# Patient Record
Sex: Male | Born: 1940
Health system: Southern US, Community
[De-identification: ages and names within clinical notes are randomized; demographics above are authoritative.]

## PROBLEM LIST (undated history)

## (undated) DIAGNOSIS — K311 Adult hypertrophic pyloric stenosis: Secondary | ICD-10-CM

## (undated) DIAGNOSIS — H04123 Dry eye syndrome of bilateral lacrimal glands: Secondary | ICD-10-CM

## (undated) DIAGNOSIS — J189 Pneumonia, unspecified organism: Secondary | ICD-10-CM

## (undated) DIAGNOSIS — R490 Dysphonia: Secondary | ICD-10-CM

## (undated) DIAGNOSIS — K632 Fistula of intestine: Secondary | ICD-10-CM

## (undated) DIAGNOSIS — K6389 Other specified diseases of intestine: Secondary | ICD-10-CM

## (undated) DIAGNOSIS — R3911 Hesitancy of micturition: Secondary | ICD-10-CM

## (undated) DIAGNOSIS — H524 Presbyopia: Secondary | ICD-10-CM

## (undated) DIAGNOSIS — Z973 Presence of spectacles and contact lenses: Secondary | ICD-10-CM

## (undated) DIAGNOSIS — H2510 Age-related nuclear cataract, unspecified eye: Secondary | ICD-10-CM

## (undated) DIAGNOSIS — K56609 Unspecified intestinal obstruction, unspecified as to partial versus complete obstruction: Secondary | ICD-10-CM

## (undated) DIAGNOSIS — E785 Hyperlipidemia, unspecified: Secondary | ICD-10-CM

## (undated) DIAGNOSIS — H11021 Central pterygium of right eye: Secondary | ICD-10-CM

## (undated) DIAGNOSIS — F0390 Unspecified dementia without behavioral disturbance: Secondary | ICD-10-CM

## (undated) DIAGNOSIS — J9611 Chronic respiratory failure with hypoxia: Secondary | ICD-10-CM

## (undated) DIAGNOSIS — IMO0002 Reserved for concepts with insufficient information to code with codable children: Secondary | ICD-10-CM

## (undated) DIAGNOSIS — H52203 Unspecified astigmatism, bilateral: Secondary | ICD-10-CM

## (undated) DIAGNOSIS — I10 Essential (primary) hypertension: Secondary | ICD-10-CM

## (undated) DIAGNOSIS — K219 Gastro-esophageal reflux disease without esophagitis: Secondary | ICD-10-CM

## (undated) DIAGNOSIS — R471 Dysarthria and anarthria: Secondary | ICD-10-CM

## (undated) DIAGNOSIS — K653 Choleperitonitis: Secondary | ICD-10-CM

## (undated) DIAGNOSIS — N529 Male erectile dysfunction, unspecified: Secondary | ICD-10-CM

## (undated) DIAGNOSIS — I998 Other disorder of circulatory system: Secondary | ICD-10-CM

## (undated) DIAGNOSIS — K279 Peptic ulcer, site unspecified, unspecified as acute or chronic, without hemorrhage or perforation: Secondary | ICD-10-CM

## (undated) DIAGNOSIS — H5203 Hypermetropia, bilateral: Secondary | ICD-10-CM

## (undated) DIAGNOSIS — J690 Pneumonitis due to inhalation of food and vomit: Secondary | ICD-10-CM

## (undated) DIAGNOSIS — H18452 Nodular corneal degeneration, left eye: Secondary | ICD-10-CM

## (undated) DIAGNOSIS — I2510 Atherosclerotic heart disease of native coronary artery without angina pectoris: Secondary | ICD-10-CM

## (undated) DIAGNOSIS — N400 Enlarged prostate without lower urinary tract symptoms: Secondary | ICD-10-CM

## (undated) DIAGNOSIS — H43819 Vitreous degeneration, unspecified eye: Secondary | ICD-10-CM

## (undated) HISTORY — PX: CARDIAC CATHETERIZATION: SHX172

## (undated) HISTORY — DX: Hypermetropia, bilateral: H52.4

## (undated) HISTORY — PX: OTHER SURGICAL HISTORY: SHX169

## (undated) HISTORY — DX: Dysarthria and anarthria: R47.1

## (undated) HISTORY — PX: TONSILLECTOMY: SUR1361

## (undated) HISTORY — DX: Reserved for concepts with insufficient information to code with codable children: IMO0002

## (undated) HISTORY — DX: Unspecified astigmatism, bilateral: H52.03

## (undated) HISTORY — DX: Hypermetropia, bilateral: H52.203

## (undated) HISTORY — DX: Atherosclerotic heart disease of native coronary artery without angina pectoris: I25.10

## (undated) HISTORY — DX: Gastro-esophageal reflux disease without esophagitis: K21.9

## (undated) HISTORY — DX: Age-related nuclear cataract, unspecified eye: H25.10

## (undated) HISTORY — DX: Benign prostatic hyperplasia without lower urinary tract symptoms: N40.0

## (undated) HISTORY — PX: CHOLECYSTECTOMY: SHX55

## (undated) HISTORY — DX: Hyperlipidemia, unspecified: E78.5

## (undated) HISTORY — DX: Nodular corneal degeneration, left eye: H18.452

## (undated) HISTORY — DX: Male erectile dysfunction, unspecified: N52.9

## (undated) HISTORY — DX: Essential (primary) hypertension: I10

## (undated) HISTORY — DX: Central pterygium of right eye: H11.021

## (undated) HISTORY — PX: EYE SURGERY: SHX253

## (undated) HISTORY — DX: Unspecified dementia, unspecified severity, without behavioral disturbance, psychotic disturbance, mood disturbance, and anxiety: F03.90

---

## 1995-02-15 DIAGNOSIS — I2510 Atherosclerotic heart disease of native coronary artery without angina pectoris: Secondary | ICD-10-CM

## 1995-02-15 HISTORY — DX: Atherosclerotic heart disease of native coronary artery without angina pectoris: I25.10

## 1999-06-12 ENCOUNTER — Ambulatory Visit (HOSPITAL_COMMUNITY): Admission: RE | Admit: 1999-06-12 | Discharge: 1999-06-12 | Payer: Self-pay | Admitting: *Deleted

## 1999-06-12 ENCOUNTER — Encounter: Payer: Self-pay | Admitting: *Deleted

## 1999-06-23 ENCOUNTER — Encounter: Payer: Self-pay | Admitting: Neurosurgery

## 1999-06-23 ENCOUNTER — Ambulatory Visit (HOSPITAL_COMMUNITY): Admission: RE | Admit: 1999-06-23 | Discharge: 1999-06-23 | Payer: Self-pay | Admitting: Neurosurgery

## 1999-07-07 ENCOUNTER — Encounter: Payer: Self-pay | Admitting: Neurosurgery

## 1999-07-07 ENCOUNTER — Ambulatory Visit (HOSPITAL_COMMUNITY): Admission: RE | Admit: 1999-07-07 | Discharge: 1999-07-07 | Payer: Self-pay | Admitting: Neurosurgery

## 1999-07-21 ENCOUNTER — Encounter: Payer: Self-pay | Admitting: Neurosurgery

## 1999-07-21 ENCOUNTER — Ambulatory Visit (HOSPITAL_COMMUNITY): Admission: RE | Admit: 1999-07-21 | Discharge: 1999-07-21 | Payer: Self-pay | Admitting: Neurosurgery

## 2006-04-07 ENCOUNTER — Encounter: Admission: RE | Admit: 2006-04-07 | Discharge: 2006-04-07 | Payer: Self-pay | Admitting: Internal Medicine

## 2006-04-26 ENCOUNTER — Encounter: Admission: RE | Admit: 2006-04-26 | Discharge: 2006-04-26 | Payer: Self-pay | Admitting: Internal Medicine

## 2006-06-15 ENCOUNTER — Encounter (INDEPENDENT_AMBULATORY_CARE_PROVIDER_SITE_OTHER): Payer: Self-pay | Admitting: Specialist

## 2006-06-15 ENCOUNTER — Ambulatory Visit (HOSPITAL_COMMUNITY): Admission: RE | Admit: 2006-06-15 | Discharge: 2006-06-15 | Payer: Self-pay | Admitting: General Surgery

## 2007-11-28 ENCOUNTER — Encounter: Admission: RE | Admit: 2007-11-28 | Discharge: 2007-11-28 | Payer: Self-pay | Admitting: Interventional Cardiology

## 2007-11-30 ENCOUNTER — Inpatient Hospital Stay (HOSPITAL_BASED_OUTPATIENT_CLINIC_OR_DEPARTMENT_OTHER): Admission: RE | Admit: 2007-11-30 | Discharge: 2007-11-30 | Payer: Self-pay | Admitting: Interventional Cardiology

## 2007-12-04 ENCOUNTER — Inpatient Hospital Stay (HOSPITAL_COMMUNITY): Admission: RE | Admit: 2007-12-04 | Discharge: 2007-12-05 | Payer: Self-pay | Admitting: Interventional Cardiology

## 2010-06-29 NOTE — Cardiovascular Report (Signed)
[name], [name]               ACCOUNT NO.:  [id number]   MEDICAL RECORD NO.:  [id number]          PATIENT TYPE:  OIB   LOCATION:  1962                         FACILITY:  [facility]   PHYSICIAN:  Lyn Records, M.D.   DATE OF BIRTH:  [birth year]   DATE OF PROCEDURE:  DATE OF DISCHARGE:  11/30/2007                            CARDIAC CATHETERIZATION   INDICATION FOR THE PROCEDURE:  Exertional dyspnea.  Prior history of  coronary artery disease with RCA stenting in 1998.  This study is being  done because of an abnormal electrocardiographic response to exercise  but a normal myocardial perfusion study.  There was a suggestion of  anterolateral ischemia.   PROCEDURES PERFORMED:  1. Left heart catheterization.  2. Selective coronary angiography.  3. Left ventriculography.   DESCRIPTION:  After informed consent and 2 mg of Versed and 50 mcg of  fentanyl, a 4-French sheath was placed in the right femoral artery using  modified Seldinger technique.  A 4-French A2 multipurpose catheter was  used for hemodynamic recordings, and left ventriculography by hand  injection.  We also performed right coronary angiography with this  catheter.  A #4 left Judkins catheter was used for left coronary  angiography.  The patient tolerated the procedure without complications.   RESULTS:  1. Hemodynamic data:      a.     Aortic pressure 105/52.      b.     Left ventricular pressure 109/90 mmHg.  2. Left ventriculography:  Left ventricular cavity size and function      are normal.  EF is 60%.  3. Coronary angiography:      a.     Left main coronary:  Normal.      b.     Left anterior descending coronary:  The proximal to mid LAD       segment is diffusely diseased with 80-90% proximal narrowing       followed by 50% narrowing in the midvessel including the region       that supplies a large first diagonal.  The first diagonal contains       50% ostial narrowing.  The LAD is transapical.      c.      Circumflex artery:  Circumflex coronary artery gives origin       to a large first and second obtuse marginal branches.  The first       obtuse marginal contains an ostial 50% narrowing.      d.     Ramus intermedius:  A large ramus branch arises from the       distal left main that is free of obstruction with the exception of       an ostial 60% narrowing.      e.     Right coronary:  The right coronary is a previously stented       vessel.  There is segmental 50% midvessel narrowing.  The distal       vessel is large.  The PDA contains 50% midvessel narrowing.   CONCLUSIONS:  1. Severe left anterior  descending narrowing involving the proximal      and mid vessel.  There is mild to moderate in-stent restenosis in      the mid right coronary with 50% obstruction of the distal portion      of the bare-metal stent.  The circumflex is widely patent with the      exception of the first obtuse marginal which contains an ostial 50%      narrowing.  2. Normal LV function.   PLAN:  Start Plavix.  PCI within the next 10-14 days.  We will discuss  in full detail with the patient.  The increased risk of the procedure  will be related to the possibility of losing a large diagonal __________  .      Lyn Records, M.D.  Electronically Signed     HWS/MEDQ  D:  11/30/2007  T:  11/30/2007  Job:  [id number]   cc:   Thora Lance, M.D.

## 2010-06-29 NOTE — Cardiovascular Report (Signed)
[name], [name]               ACCOUNT NO.:  [id number]   MEDICAL RECORD NO.:  [id number]          PATIENT TYPE:  OIB   LOCATION:  6526                         FACILITY:  [facility]   PHYSICIAN:  Lyn Records, M.D.   DATE OF BIRTH:  [birth year]   DATE OF PROCEDURE:  12/04/2007  DATE OF DISCHARGE:                            CARDIAC CATHETERIZATION   INDICATIONS:  Recent dyspnea on exertion.  High-grade LAD by diagnostic  cath.  This study is being done to stent, the LAD, and to improve the  ischemic symptom, which is dyspnea on exertion.   PROCEDURE PERFORMED:  Overlapping drug-eluting stents, mid-left anterior  descending.   DESCRIPTION:  After informed consent, a 6-French sheath was placed in  the right femoral artery using a modified Seldinger technique.  Several  aliquots of Versed 1 mg and fentanyl 25 mcg were administered for a  total of 3 mg of Versed.  The patient received a total of 100 mcg of  fentanyl.   The patient underwent the procedure after placing a 6-French sheath  using the modified Seldinger technique following 1% Xylocaine local  infiltration.  A 6-French 3.5 CLS guide catheter was used for support  and coronary visualization.  A BMW wire was placed down the diagonal in  the mid-LAD and a Prowater wire down the LAD proper.  A 2.5 x 12-mm long  apex balloon was used to predilate the severe stenosis in the mid LAD  proximal to the large diagonal.  We then placed a 15-mm long x 3.0-mm  diameter Palmaz stent in the proximal lesion slightly overlapping the  diagonal distally.  This balloon/stent following inflatus was clearly  larger than the distal segment.  A second shorter 2.75-mm Palmaz stent  was overlapped distally.  Post dilatation was then performed with a 3.0  x 8-mm long [place] Voyager at 13 atmospheres.  A 12-mm x 3.25-mm [place] Voyager  was used to dilate the proximal to thirds of the stented region.  A 13  atmospheres peak pressure was performed.  The large  diagonal branch  remained patent after stenting.  The guidewire in the diagonal was  removed before high-pressure balloon dilatation.   Angiomax 0.75 mg/kg bolus followed by a 1.75 mg/kg infusion per hour was  administered.  ACT was documented to be greater than 300.  Angiomax was  discontinued at the completion of the procedure.   RESULTS:  The stented region exhibited no evidence of stenosis post  stenting.  The large diagonal branch, which arises in the distal third  of the stented region remained widely patent with no evidence of reduced  flow.   CONCLUSION:  Successful stenting of the mid-left anterior descending  with overlapping Palmaz drug-eluting stents with a smaller diameter of  3.0 mm distally and 2.25 mm proximally.  TIMI grade 3 flow was noted  with no evidence of reduced flow of the large diagonal.   PLAN:  Aspirin and Plavix indefinitely, but not shorter than 1 year.  Discharge in a.m.      Lyn Records, M.D.  Electronically Signed  HWS/MEDQ  D:  12/04/2007  T:  12/04/2007  Job:  [id number]   cc:   Thora Lance, M.D.

## 2010-06-29 NOTE — Discharge Summary (Signed)
NAMEGILFORD, LARDIZABAL               ACCOUNT NO.:  [id number]   MEDICAL RECORD NO.:  [id number]          PATIENT TYPE:  OIB   LOCATION:  6526                         FACILITY:  [facility]   PHYSICIAN:  Lyn Records, M.D.   DATE OF BIRTH:  Nov 16, 1940   DATE OF ADMISSION:  12/04/2007  DATE OF DISCHARGE:                               DISCHARGE SUMMARY   DISCHARGE DIAGNOSES:  1. Coronary artery disease, status post drug-eluting stent to the left      anterior descending.  2. Essential hypertension, benign.  3. Pure hypercholesterolemia.  4. Long-term medication use.  5. Gastroesophageal reflux disease.  6. Erectile dysfunction.  7. Degenerative disk disease.   HOSPITAL COURSE:  Mr. [name] is a 70 year old male patient who has  known coronary artery disease.  He received a bare-metal stent to the  right coronary artery in 1997.  Over the past several months, he has had  some dyspnea on exertion, and it ultimately led to an exercise stress  test.  The stress test showed an abnormal perfusion pattern with an  inferior basal defect and a small region of anterolateral and mid  anterolateral ischemia.  His EF was normal.   He was brought into the hospital on November 30, 2007, for cardiac  catheterization.  This showed an 80% proximal LAD stenosis, first  diagonal 50% ostial, OM-1 with 50% ostial lesion, the ramus with 60%  narrowing, right coronary artery with a 50% mid vessel narrowing, the  PDA 50% mid vessel narrowing.   The patient was brought back to the hospital on December 04, 2007, and  underwent drug-eluting stent placement to the LAD lesion.  He tolerated  the procedure well and remained in the hospital overnight.   LABORATORY STUDIES UPON DISCHARGE:  Sodium of 138, potassium 4.3, BUN  14, creatinine 0.95, hemoglobin 14.3, and hematocrit 41.7.   DISCHARGE MEDICATIONS:  1. Plavix 75 mg a day.  2. Enteric-coated aspirin 325 mg daily.  3. Lipitor 40 mg a day.  4.  Diovan/hydrochlorothiazide 160/12.5 mg a day.  5. Ranitidine daily as before.  6. Multivitamins as before.  7. Sublingual nitroglycerin p.r.n. chest pain.   The patient is to remain on a low-sodium heart-healthy diet.  Clean cath  site gently with soap and water, no scrubbing.  Increase activities  slowly.  No lifting over 10 pounds for 1 week.  No driving for 2 days.  Follow up with Dr. Katrinka Blazing on December 19, 2007, at 11:30 a.m.      Guy Franco, P.A.      Lyn Records, M.D.  Electronically Signed    LB/MEDQ  D:  12/05/2007  T:  12/05/2007  Job:  [id number]   cc:   Thora Lance, M.D.

## 2010-07-02 NOTE — Op Note (Signed)
[name], [name]               ACCOUNT NO.:  [id number]   MEDICAL RECORD NO.:  [id number]          PATIENT TYPE:  AMB   LOCATION:  SDS                          FACILITY:  [facility]   PHYSICIAN:  Cherylynn Ridges, M.D.    DATE OF BIRTH:  21-Jun-1940   DATE OF PROCEDURE:  06/15/2006  DATE OF DISCHARGE:                               OPERATIVE REPORT   PREOPERATIVE DIAGNOSIS:  Symptomatic cholelithiasis.   POSTOPERATIVE DIAGNOSIS:  Symptomatic cholelithiasis.   PROCEDURE:  Laparoscopic cholecystectomy with cholangiogram.   SURGEON:  Cherylynn Ridges, M.D.   ASSISTANT:  Magnus Ivan, RNFA   ANESTHESIA:  General endotracheal.   SPECIMEN:  Gallbladder plus stones.   ESTIMATED BLOOD LOSS:  Less than 30 mL.   COMPLICATIONS:  None.   CONDITION:  Stable.   INDICATIONS FOR OPERATION:  The patient is a 69 year old who had a  gallbladder attack and was found to have stones.  He comes in now for an  elective laparoscopic cholecystectomy.   FINDINGS:  The patient had a normal cholangiogram with good flow into  the duodenum, no ductal dilatation, and no filling defects.  He had a  large stone in his gallbladder and evidence of acute inflammation on  laparoscopy.   OPERATION:  The patient was taken to the operating room and placed on  the table in the supine position.  After an adequate general anesthetic  was administered, he was prepped and draped in the usual sterile manner  exposing the midline and right upper quadrant.   A supraumbilical curvilinear incision was made using a #11 blade and  taken down to the fascia just above the umbilicus.  The patient had a  slight periumbilical defect which was used to enter into the peritoneal  cavity.  The fascia was grasped with Kocher clamps, and then we bluntly  dissected down into the peritoneal cavity with a Kelly clamp.  We  stretched out this cavity using a Kelly clamp as we punctured into the  preperitoneal space through that into the  peritoneal cavity.  A  pursestring suture of 0 Vicryl was passed around the fascial opening and  then a Hassan cannula passed into the peritoneal cavity and secured in  place with the pursestring suture.   We insufflated carbon dioxide gas up to a maximal pressure of 15 mmHg,  and then we passed two right costal margin 5-mm cannulas and a  subxiphoid 12/11-mm cannula under direct vision with the patient in  reverse Trendelenburg and the left side tilted down.  Once this was  done, the dissection was begun.   There were omental adhesions to the body and infundibulum the  gallbladder which were bluntly dissected away so that we could grasp it  and retract it towards the anterior abdominal wall and right upper  quadrant.  We dissected out the peritoneum overlying the triangle of  Calot and hepatoduodenal triangle, isolating the cystic duct and the  cystic artery.  The cystic artery was clipped proximally x2 and distally  x1 and transected.  We passed the clip along the gallbladder side and  cystic duct and then made a cholecystodochotomy through which a Cook  catheter which had been passed through the anterior abdominal wall was  passed.  This allowed [place] to perform a cholangiogram which showed  findings mentioned previously, no ductal filling defects, no dilatation,  good flow into the duodenum, and good proximal filling.   Once cholangiogram was completed, we removed the clip which had secured  it in place, distally clipped the cystic duct x3 and then transected it.  We transected a posterior branch of the cystic artery also and then  dissected out the gallbladder from the hepatic bed with minimal  difficulty.  We retrieved it from the supraumbilical site without having  to use an EndoCatch bag.  The pursestring suture which was in place to  secure the [facility] cannula was tied down in order to close off the  fascia, and we placed an additional stitch of to the right lateral  aspect of  the fascial closure.  This was with 0 Vicryl.  We aspirated  fluid and blood from around the gallbladder and above the liver.  We  aspirated fluid and gas and removed all that prior to removing all  cannulas.   The skin was closed using running subcuticular stitch of 4-0 Vicryl at  all sites, except for the lateral-most sites which were closed with  Dermabond.  Sterile dressings were applied to all wounds.  0.25%  Marcaine with epinephrine was injected at all sites.  Needle counts and  sponge counts and instrument counts were correct at the conclusion of  the case.      Cherylynn Ridges, M.D.  Electronically Signed     JOW/MEDQ  D:  06/15/2006  T:  06/15/2006  Job:  [id number]   cc:   Lyn Records, M.D.  Thora Lance, M.D.

## 2010-11-15 LAB — POCT I-STAT, CHEM 8
Calcium, Ion: 1.18
Glucose, Bld: 94
HCT: 39
Hemoglobin: 13.3
TCO2: 26

## 2010-11-15 LAB — CBC
Platelets: 283
RDW: 12.1

## 2010-11-15 LAB — BASIC METABOLIC PANEL
Chloride: 106
Creatinine, Ser: 0.95
Potassium: 4

## 2012-04-17 ENCOUNTER — Other Ambulatory Visit: Payer: Self-pay | Admitting: Internal Medicine

## 2012-04-17 DIAGNOSIS — I77819 Aortic ectasia, unspecified site: Secondary | ICD-10-CM

## 2012-10-25 ENCOUNTER — Ambulatory Visit
Admission: RE | Admit: 2012-10-25 | Discharge: 2012-10-25 | Disposition: A | Discharge status: Home / Self Care | Payer: Medicare Other | Source: Ambulatory Visit | Attending: Internal Medicine | Admitting: Internal Medicine

## 2012-10-25 DIAGNOSIS — I77819 Aortic ectasia, unspecified site: Secondary | ICD-10-CM

## 2012-10-30 DIAGNOSIS — H18459 Nodular corneal degeneration, unspecified eye: Secondary | ICD-10-CM | POA: Insufficient documentation

## 2012-10-30 DIAGNOSIS — H2510 Age-related nuclear cataract, unspecified eye: Secondary | ICD-10-CM | POA: Insufficient documentation

## 2012-10-30 DIAGNOSIS — H5200 Hypermetropia, unspecified eye: Secondary | ICD-10-CM | POA: Insufficient documentation

## 2012-10-30 DIAGNOSIS — H43819 Vitreous degeneration, unspecified eye: Secondary | ICD-10-CM

## 2012-10-30 DIAGNOSIS — H11029 Central pterygium of unspecified eye: Secondary | ICD-10-CM | POA: Insufficient documentation

## 2012-10-30 HISTORY — DX: Vitreous degeneration, unspecified eye: H43.819

## 2013-04-24 DIAGNOSIS — I10 Essential (primary) hypertension: Secondary | ICD-10-CM | POA: Insufficient documentation

## 2013-04-24 DIAGNOSIS — E785 Hyperlipidemia, unspecified: Secondary | ICD-10-CM | POA: Insufficient documentation

## 2013-04-24 DIAGNOSIS — I2510 Atherosclerotic heart disease of native coronary artery without angina pectoris: Secondary | ICD-10-CM | POA: Insufficient documentation

## 2013-04-25 ENCOUNTER — Encounter (INDEPENDENT_AMBULATORY_CARE_PROVIDER_SITE_OTHER): Payer: Self-pay

## 2013-04-25 ENCOUNTER — Encounter: Payer: Self-pay | Admitting: Interventional Cardiology

## 2013-04-25 ENCOUNTER — Ambulatory Visit (INDEPENDENT_AMBULATORY_CARE_PROVIDER_SITE_OTHER): Payer: Medicare Other | Admitting: Interventional Cardiology

## 2013-04-25 VITALS — BP 104/58 | HR 60 | Ht 72.0 in | Wt 210.0 lb

## 2013-04-25 DIAGNOSIS — R0609 Other forms of dyspnea: Secondary | ICD-10-CM

## 2013-04-25 DIAGNOSIS — R0989 Other specified symptoms and signs involving the circulatory and respiratory systems: Secondary | ICD-10-CM

## 2013-04-25 NOTE — Patient Instructions (Signed)
Your physician recommends that you continue on your current medications as directed. Please refer to the Current Medication list given to you today.  Your physician has requested that you have en exercise stress myoview. For further information please visit [email or web address]. Please follow instruction sheet, as given.   

## 2013-04-25 NOTE — Progress Notes (Signed)
Patient ID: [name], male   DOB: [birth year], 73 y.o.   MRN: [id number] Past Medical History  #1. Coronary atherosclerotic heart disease. Bare metal stent right coronary artery 1997., Brittne Kawasaki, DES LAD 10/09   #2. Hypertension   #3. Gastroesophageal reflux disease   #4. Erectile dysfunction   #5. Degenerative disc disease   BPH      [address] [place], [place]  [address] Phone: [phone] Fax:  [phone]  Date:  04/25/2013   ID:  [name], DOB 08-Sep-1940, MRN [id number]  PCP:  No primary provider on file.   ASSESSMENT:  1. Two-month history of exertional dyspnea and nocturnal chest tightness 2. Coronary artery disease with prior history of RCA bare-metal stent 1997 and LAD DES 2000 and 3. Hypertension 4. Hyperlipidemia 5. He erectile dysfunction  PLAN:  1. Stress Cardiolite to rule out myocardial ischemia as the source of dyspnea and chest tightness   SUBJECTIVE: [name] is a 73 y.o. male who has a long-standing history of coronary artery disease. First coronary event occurred in 1997. A younger brother recently died of an acute myocardial infarction. He comes in now with a two-month history of exertional intolerance, dyspnea, and mild tightness with activities that he customarily performs. He is a member of the [employer] program at the [facility] and has noted these complaints with activities that he could previously performed fairly easily. Rest relieves the discomfort and dyspnea promptly. He also notices exertional leg fatigue.   Wt Readings from Last 3 Encounters:  No data found for Wt     Past Medical History  Diagnosis Date  . Hyperlipidemia   . Hypertension   . Coronary artery disease 1997    bare mental stent right coronart artery  . Gastroesophageal reflux disease   . ED (erectile dysfunction)   . Degenerative disc disease     Current Outpatient Prescriptions  Medication Sig Dispense Refill  . aspirin  325 MG tablet Take 325 mg by mouth daily.      [name] atorvastatin (LIPITOR) 40 MG tablet Take 40 mg by mouth daily.      . clopidogrel (PLAVIX) 75 MG tablet Take 75 mg by mouth daily with breakfast.      . Multiple Vitamin (MULTIVITAMIN) tablet Take 1 tablet by mouth daily.      . nitroGLYCERIN (NITROSTAT) 0.4 MG SL tablet Place 0.4 mg under the tongue every 5 (five) minutes as needed for chest pain.      . ranitidine (ZANTAC) 150 MG tablet Take 150 mg by mouth 2 (two) times daily.      . valsartan-hydrochlorothiazide (DIOVAN-HCT) 160-12.5 MG per tablet Take 1 tablet by mouth daily.       No current facility-administered medications for this visit.    Allergies:    Allergies  Allergen Reactions  . Lisinopril Cough    Social History:  The patient     ROS:  Please see the history of present illness.   Denies palpitations, orthopnea, PND, edema, and neurological complaints.   All other systems reviewed and negative.   OBJECTIVE: VS:  There were no vitals taken for this visit. Well nourished, well developed, in no acute distress, younger than stated age HEENT: normal Neck: JVD flat. Carotid bruit absent  Cardiac:  normal S1, S2; RRR; no murmur Lungs:  clear to auscultation bilaterally, no wheezing, rhonchi or rales Abd: soft, nontender, no hepatomegaly Ext: Edema absent. Pulses 2+ bilateral Skin: warm and  dry Neuro:  CNs 2-12 intact, no focal abnormalities noted  EKG:  Sinus bradycardia       Signed, Darci NeedleHenry W. B. Maysen Sudol III, MD 04/25/2013 8:16 AM

## 2013-05-09 ENCOUNTER — Ambulatory Visit (HOSPITAL_COMMUNITY): Payer: Medicare Other | Attending: Internal Medicine | Admitting: Radiology

## 2013-05-09 ENCOUNTER — Encounter: Payer: Self-pay | Admitting: Internal Medicine

## 2013-05-09 VITALS — BP 137/62 | HR 57 | Ht 72.0 in | Wt 208.0 lb

## 2013-05-09 DIAGNOSIS — R079 Chest pain, unspecified: Secondary | ICD-10-CM | POA: Insufficient documentation

## 2013-05-09 DIAGNOSIS — R0989 Other specified symptoms and signs involving the circulatory and respiratory systems: Secondary | ICD-10-CM | POA: Insufficient documentation

## 2013-05-09 DIAGNOSIS — I2510 Atherosclerotic heart disease of native coronary artery without angina pectoris: Secondary | ICD-10-CM

## 2013-05-09 DIAGNOSIS — R0609 Other forms of dyspnea: Secondary | ICD-10-CM | POA: Insufficient documentation

## 2013-05-09 MED ORDER — TECHNETIUM TC 99M SESTAMIBI GENERIC - CARDIOLITE
30.0000 | Freq: Once | INTRAVENOUS | Status: AC | PRN
  Administered 2013-05-09: 30 via INTRAVENOUS

## 2013-05-09 MED ORDER — TECHNETIUM TC 99M SESTAMIBI GENERIC - CARDIOLITE
10.0000 | Freq: Once | INTRAVENOUS | Status: AC | PRN
  Administered 2013-05-09: 10 via INTRAVENOUS

## 2013-05-09 NOTE — Progress Notes (Signed)
[facility] SITE 3 NUCLEAR MED [address] [place], [place] [address] [phone]    Cardiology Nuclear Med Study  [name] is a 73 y.o. male     MRN : [id number]     DOB: [birth year]  Procedure Date: 05/09/2013  Nuclear Med Background Indication for Stress Test:  Evaluation for Ischemia and Stent Patency History:  CAD, Stent to RCA 1997 and LAD 2009, MPI 2010 (normal)(scar) EF 67% Cardiac Risk Factors: History of Smoking, Hypertension and Lipids  Symptoms:  Chest Pain (last date of chest discomfort was a week and a half ago) and DOE   Nuclear Pre-Procedure Caffeine/Decaff Intake:  None NPO After: 8:00pm   Lungs:  clear O2 Sat: 96% on room air. IV 0.9% NS with Angio Cath:  22g  IV Site: R Hand  IV Started by:  Bonnita LevanJackie Smith, RN  Chest Size (in):  46 Cup Size: n/a  Height: 6' (1.829 m)  Weight:  208 lb (94.348 kg)  BMI:  Body mass index is 28.2 kg/(m^2). Tech Comments:  N/A    Nuclear Med Study 1 or 2 day study: 1 day  Stress Test Type:  Stress  Reading MD: N/A  Order Authorizing Provider:  Verdis PrimeHenry Smith, MD  Resting Radionuclide: Technetium 4068m Sestamibi  Resting Radionuclide Dose: 11.0 mCi   Stress Radionuclide:  Technetium 7468m Sestamibi  Stress Radionuclide Dose: 33.0 mCi           Stress Protocol Rest HR: 57 Stress HR: 137  Rest BP: 137/62 Stress BP: 208/56  Exercise Time (min): 4:30 METS: 6.4           Dose of Adenosine (mg):  n/a Dose of Lexiscan: n/a mg  Dose of Atropine (mg): n/a Dose of Dobutamine: n/a mcg/kg/min (at max HR)  Stress Test Technologist: Nelson ChimesSharon Brooks, BS-ES  Nuclear Technologist:  Domenic PoliteStephen Carbone, CNMT     Rest Procedure:  Myocardial perfusion imaging was performed at rest 45 minutes following the intravenous administration of Technetium 3068m Sestamibi. Rest ECG: NSR - Normal EKG  Stress Procedure:  The patient exercised on the treadmill utilizing the Bruce Protocol for 4:30 minutes. The patient stopped due to being very  SOB and denied any chest pain.  Technetium 8368m Sestamibi was injected at peak exercise and myocardial perfusion imaging was performed after a brief delay. Stress ECG: No significant change from baseline ECG  QPS Raw Data Images:  Normal; no motion artifact; normal heart/lung ratio. Stress Images:  Normal homogeneous uptake in all areas of the myocardium. Rest Images:  Normal homogeneous uptake in all areas of the myocardium. Subtraction (SDS):  No evidence of ischemia. Transient Ischemic Dilatation (Normal <1.22):  0.98 Lung/Heart Ratio (Normal <0.45):  0.29  Quantitative Gated Spect Images QGS EDV:  118 ml QGS ESV:  51 ml  Impression Exercise Capacity:  Poor exercise capacity. BP Response:  Hypertensive blood pressure response. Clinical Symptoms:  The exercise was limited by severe dyspnea.. ECG Impression:  No significant ST segment change suggestive of ischemia. Comparison with Prior Nuclear Study: No images to compare  Overall Impression:  Low risk stress nuclear study.  No ischemia or scar. Poor exercise tolerance limited by dyspnea.  LV Ejection Fraction: 57%.  LV Wall Motion:  NL LV Function; NL Wall Motion   Limited [name]

## 2013-05-14 ENCOUNTER — Telehealth: Payer: Self-pay

## 2013-05-14 NOTE — Telephone Encounter (Signed)
called to give pt myoview results.unable to lmom pt phone rings out. 

## 2013-05-14 NOTE — Telephone Encounter (Signed)
Message copied by Jarvis NewcomerPARRIS-GODLEY, LISA S on Tue May 14, 2013 10:27 AM ------      Message from: Verdis PrimeSMITH, HENRY      Created: Fri May 10, 2013 12:22 PM       Low risk study without evidence of blocked arteries ------

## 2013-05-15 NOTE — Telephone Encounter (Signed)
pt aware of myoview results.Low risk study without evidence of blocked arteries.pt verbalized understanding.

## 2013-05-15 NOTE — Telephone Encounter (Signed)
Message copied by Jarvis NewcomerPARRIS-GODLEY, LISA S on Wed May 15, 2013  1:32 PM ------      Message from: Verdis PrimeSMITH, HENRY      Created: Fri May 10, 2013 12:22 PM       Low risk study without evidence of blocked arteries ------

## 2014-12-11 ENCOUNTER — Other Ambulatory Visit: Payer: Self-pay | Admitting: Internal Medicine

## 2014-12-11 DIAGNOSIS — R1084 Generalized abdominal pain: Secondary | ICD-10-CM

## 2014-12-17 ENCOUNTER — Ambulatory Visit
Admission: RE | Admit: 2014-12-17 | Discharge: 2014-12-17 | Disposition: A | Discharge status: Home / Self Care | Payer: Medicare Other | Source: Ambulatory Visit | Attending: Internal Medicine | Admitting: Internal Medicine

## 2014-12-17 DIAGNOSIS — R1084 Generalized abdominal pain: Secondary | ICD-10-CM

## 2014-12-17 MED ORDER — IOPAMIDOL (ISOVUE-300) INJECTION 61%
125.0000 mL | Freq: Once | INTRAVENOUS | Status: AC | PRN
  Administered 2014-12-17: 125 mL via INTRAVENOUS

## 2015-03-04 ENCOUNTER — Other Ambulatory Visit: Payer: Self-pay | Admitting: Gastroenterology

## 2015-06-04 ENCOUNTER — Other Ambulatory Visit: Payer: Self-pay | Admitting: Gastroenterology

## 2015-06-11 ENCOUNTER — Other Ambulatory Visit: Payer: Self-pay | Admitting: Internal Medicine

## 2015-06-11 ENCOUNTER — Ambulatory Visit
Admission: RE | Admit: 2015-06-11 | Discharge: 2015-06-11 | Disposition: A | Discharge status: Home / Self Care | Payer: Medicare Other | Source: Ambulatory Visit | Attending: Internal Medicine | Admitting: Internal Medicine

## 2015-06-11 DIAGNOSIS — R471 Dysarthria and anarthria: Secondary | ICD-10-CM

## 2015-06-20 ENCOUNTER — Ambulatory Visit
Admission: RE | Admit: 2015-06-20 | Discharge: 2015-06-20 | Disposition: A | Discharge status: Home / Self Care | Payer: Medicare Other | Source: Ambulatory Visit | Attending: Internal Medicine | Admitting: Internal Medicine

## 2015-06-20 DIAGNOSIS — R471 Dysarthria and anarthria: Secondary | ICD-10-CM

## 2015-06-24 ENCOUNTER — Encounter (HOSPITAL_COMMUNITY): Payer: Self-pay | Admitting: *Deleted

## 2015-07-01 ENCOUNTER — Ambulatory Visit (HOSPITAL_COMMUNITY): Payer: Medicare Other | Admitting: Anesthesiology

## 2015-07-01 ENCOUNTER — Ambulatory Visit (HOSPITAL_COMMUNITY)
Admission: RE | Admit: 2015-07-01 | Discharge: 2015-07-01 | Disposition: A | Discharge status: Home / Self Care | Payer: Medicare Other | Source: Ambulatory Visit | Attending: Gastroenterology | Admitting: Gastroenterology

## 2015-07-01 ENCOUNTER — Encounter (HOSPITAL_COMMUNITY)
Admission: RE | Disposition: A | Discharge status: Home / Self Care | Payer: Self-pay | Source: Ambulatory Visit | Attending: Gastroenterology

## 2015-07-01 ENCOUNTER — Encounter (HOSPITAL_COMMUNITY): Payer: Self-pay

## 2015-07-01 ENCOUNTER — Other Ambulatory Visit: Payer: Self-pay | Admitting: Gastroenterology

## 2015-07-01 DIAGNOSIS — M1990 Unspecified osteoarthritis, unspecified site: Secondary | ICD-10-CM | POA: Insufficient documentation

## 2015-07-01 DIAGNOSIS — Z7982 Long term (current) use of aspirin: Secondary | ICD-10-CM | POA: Diagnosis not present

## 2015-07-01 DIAGNOSIS — I2510 Atherosclerotic heart disease of native coronary artery without angina pectoris: Secondary | ICD-10-CM | POA: Diagnosis not present

## 2015-07-01 DIAGNOSIS — I10 Essential (primary) hypertension: Secondary | ICD-10-CM | POA: Insufficient documentation

## 2015-07-01 DIAGNOSIS — Z87891 Personal history of nicotine dependence: Secondary | ICD-10-CM | POA: Diagnosis not present

## 2015-07-01 DIAGNOSIS — Z09 Encounter for follow-up examination after completed treatment for conditions other than malignant neoplasm: Secondary | ICD-10-CM | POA: Diagnosis present

## 2015-07-01 DIAGNOSIS — K259 Gastric ulcer, unspecified as acute or chronic, without hemorrhage or perforation: Secondary | ICD-10-CM | POA: Insufficient documentation

## 2015-07-01 DIAGNOSIS — Z79899 Other long term (current) drug therapy: Secondary | ICD-10-CM | POA: Diagnosis not present

## 2015-07-01 DIAGNOSIS — K219 Gastro-esophageal reflux disease without esophagitis: Secondary | ICD-10-CM | POA: Insufficient documentation

## 2015-07-01 DIAGNOSIS — Z955 Presence of coronary angioplasty implant and graft: Secondary | ICD-10-CM | POA: Insufficient documentation

## 2015-07-01 HISTORY — PX: ESOPHAGOGASTRODUODENOSCOPY (EGD) WITH PROPOFOL: SHX5813

## 2015-07-01 SURGERY — ESOPHAGOGASTRODUODENOSCOPY (EGD) WITH PROPOFOL
Anesthesia: Monitor Anesthesia Care

## 2015-07-01 MED ORDER — PROPOFOL 500 MG/50ML IV EMUL
INTRAVENOUS | Status: DC | PRN
  Administered 2015-07-01: 125 ug/kg/min via INTRAVENOUS

## 2015-07-01 MED ORDER — SODIUM CHLORIDE 0.9 % IV SOLN: INTRAVENOUS | Status: DC

## 2015-07-01 MED ORDER — PROPOFOL 500 MG/50ML IV EMUL
INTRAVENOUS | Status: DC | PRN
  Administered 2015-07-01: 60 mg via INTRAVENOUS

## 2015-07-01 MED ORDER — LACTATED RINGERS IV SOLN
INTRAVENOUS | Status: DC
  Administered 2015-07-01: 1000 mL via INTRAVENOUS

## 2015-07-01 MED ORDER — PROPOFOL 10 MG/ML IV BOLUS
INTRAVENOUS | Status: AC
  Filled 2015-07-01: qty 40

## 2015-07-01 SURGICAL SUPPLY — 15 items

## 2015-07-01 NOTE — Discharge Instructions (Signed)
Endoscopy °Care After °Please read the instructions outlined below and refer to this sheet in the next few weeks. These discharge instructions provide you with general information on caring for yourself after you leave the hospital. Your doctor may also give you specific instructions. While your treatment has been planned according to the most current medical practices available, unavoidable complications occasionally occur. If you have any problems or questions after discharge, please call Dr. Jarion Hawthorne ([facility]) at [phone]. ° °HOME CARE INSTRUCTIONS °Activity °· You may resume your regular activity but move at a slower pace for the next 24 hours.  °· Take frequent rest periods for the next 24 hours.  °· Walking will help expel (get rid of) the air and reduce the bloated feeling in your abdomen.  °· No driving for 24 hours (because of the anesthesia (medicine) used during the test).  °· You may shower.  °· Do not sign any important legal documents or operate any machinery for 24 hours (because of the anesthesia used during the test).  °Nutrition °· Drink plenty of fluids.  °· You may resume your normal diet.  °· Begin with a light meal and progress to your normal diet.  °· Avoid alcoholic beverages for 24 hours or as instructed by your caregiver.  °Medications °You may resume your normal medications unless your caregiver tells you otherwise. °What you can expect today °· You may experience abdominal discomfort such as a feeling of fullness or "gas" pains.  °· You may experience a sore throat for 2 to 3 days. This is normal. Gargling with salt water may help this.  °·  °SEEK IMMEDIATE MEDICAL CARE IF: °· You have excessive nausea (feeling sick to your stomach) and/or vomiting.  °· You have severe abdominal pain and distention (swelling).  °· You have trouble swallowing.  °· You have a temperature over 100° F (37.8° C).  °· You have rectal bleeding or vomiting of blood.  °Document Released:  09/15/2003 Document Revised: 10/13/2010 Document Reviewed: 03/28/2007 °ExitCare® Patient Information ©2012 [employer]. °

## 2015-07-01 NOTE — Op Note (Signed)
[facility] Patient Name: Danny MealingRalph Goody Procedure Date: 07/01/2015 MRN: [id number] Attending MD: Willis ModenaWilliam Jayleigh Notarianni , MD Date of Birth: [birth year] CSN: [id number] Age: [age] Admit Type: Outpatient Procedure:                Upper GI endoscopy Indications:              Epigastric abdominal pain, Follow-up of peptic ulcer Providers:                Willis ModenaWilliam Ellayna Hilligoss, MD, Anthony Saraniel Madden, RN, Jacquiline DoeJennifer                            Zhu, RN, Clearnce SorrelKatie Smith, Technician, Mirian MoKelley Carver,                            CRNA Referring MD:              Medicines:                Monitored Anesthesia Care Complications:            No immediate complications. Estimated Blood Loss:     Estimated blood loss: none. Procedure:                Pre-Anesthesia Assessment:                           - Prior to the procedure, a History and Physical                            was performed, and patient medications and                            allergies were reviewed. The patient's tolerance of                            previous anesthesia was also reviewed. The risks                            and benefits of the procedure and the sedation                            options and risks were discussed with the patient.                            All questions were answered, and informed consent                            was obtained. Prior Anticoagulants: The patient has                            taken aspirin. ASA Grade Assessment: III - A                            patient with severe systemic disease. After  reviewing the risks and benefits, the patient was                            deemed in satisfactory condition to undergo the                            procedure.                           After obtaining informed consent, the endoscope was                            passed under direct vision. Throughout the                            procedure, the patient's blood pressure, pulse,  and                            oxygen saturations were monitored continuously. The                            was introduced through the mouth, and advanced to                            the pylorus. The upper GI endoscopy was                            accomplished without difficulty. The patient                            tolerated the procedure well. Scope In: Scope Out: Findings:      The examined esophagus was normal.      One non-bleeding superficial gastric ulcer with no stigmata of bleeding       was found at the pylorus. This was biopsied with a cold forceps for       histology. Lumen of pyloric channel was about 6 mm in diameter, and too       narrow to permit intubation of the duodenum; the duodenum was       accordingly not evaluated. Impression:               - Normal esophagus.                           - Non-bleeding gastric ulcer with no stigmata of                            bleeding. Biopsied.                           - The examination was otherwise normal. Moderate Sedation:      None Recommendation:           - Patient has a contact number available for                            emergencies. The signs and  symptoms of potential                            delayed complications were discussed with the                            patient. Return to normal activities tomorrow.                            Written discharge instructions were provided to the                            patient.                           - Discharge patient to home (ambulatory).                           - Resume previous diet today.                           - Continue present medications.                           - Await pathology results.                           - Return to GI clinic after studies are complete.                           - Return to referring physician as previously                            scheduled. Procedure Code(s):        --- Professional ---                            [phone], 52, Esophagogastroduodenoscopy, flexible,                            transoral; with biopsy, single or multiple Diagnosis Code(s):        --- Professional ---                           K25.9, Gastric ulcer, unspecified as acute or                            chronic, without hemorrhage or perforation                           R10.13, Epigastric pain                           K27.9, Peptic ulcer, site unspecified, unspecified                            as acute or chronic, without hemorrhage or  perforation CPT copyright 2016 American Medical Association. All rights reserved. The codes documented in this report are preliminary and upon coder review may  be revised to meet current compliance requirements. Willis Modena, MD 07/01/2015 10:04:34 AM This report has been signed electronically. Number of Addenda: 0

## 2015-07-01 NOTE — Transfer of Care (Signed)
Immediate Anesthesia Transfer of Care Note  Patient: [name]  Procedure(s) Performed: Procedure(s): ESOPHAGOGASTRODUODENOSCOPY (EGD) WITH PROPOFOL (N/A)  Patient Location: PACU  Anesthesia Type:MAC  Level of Consciousness: sedated, patient cooperative and responds to stimulation  Airway & Oxygen Therapy: Patient Spontanous Breathing and Patient connected to nasal cannula oxygen  Post-op Assessment: Report given to RN and Post -op Vital signs reviewed and stable  Post vital signs: Reviewed and stable  Last Vitals:  Filed Vitals:   07/01/15 0830 07/01/15 0934  BP: 150/39 127/44  Pulse: 51 43  Temp: 36.6 C   Resp: 15 18    Last Pain: There were no vitals filed for this visit.       Complications: No apparent anesthesia complications

## 2015-07-01 NOTE — Anesthesia Postprocedure Evaluation (Signed)
Anesthesia Post Note  Patient: [name]  Procedure(s) Performed: Procedure(s) (LRB): ESOPHAGOGASTRODUODENOSCOPY (EGD) WITH PROPOFOL (N/A)  Patient location during evaluation: Endoscopy Anesthesia Type: MAC Level of consciousness: awake and alert Pain management: pain level controlled Vital Signs Assessment: post-procedure vital signs reviewed and stable Respiratory status: spontaneous breathing, nonlabored ventilation, respiratory function stable and patient connected to nasal cannula oxygen Cardiovascular status: stable and blood pressure returned to baseline Anesthetic complications: no    Last Vitals:  Filed Vitals:   07/01/15 0950 07/01/15 1000  BP: 149/49 164/43  Pulse: 49 43  Temp:    Resp: 20 17    Last Pain: There were no vitals filed for this visit.               Cecile HearingStephen Edward Turk

## 2015-07-01 NOTE — Anesthesia Preprocedure Evaluation (Addendum)
Anesthesia Evaluation  Patient identified by MRN, date of birth, ID band Patient awake    Reviewed: Allergy & Precautions, NPO status , Patient's Chart, lab work & pertinent test results  Airway Mallampati: II  TM Distance: <3 FB Neck ROM: Full    Dental  (+) Teeth Intact, Dental Advisory Given   Pulmonary neg pulmonary ROS, former smoker,    Pulmonary exam normal breath sounds clear to auscultation       Cardiovascular Exercise Tolerance: Good hypertension, Pt. on medications (-) angina+ CAD and + Cardiac Stents (RCA BMS)  (-) Past MI negative cardio ROS Normal cardiovascular exam Rhythm:Regular Rate:Normal     Neuro/Psych negative neurological ROS     GI/Hepatic Neg liver ROS, GERD  Medicated,  Endo/Other  negative endocrine ROS  Renal/GU negative Renal ROS     Musculoskeletal  (+) Arthritis , Osteoarthritis,    Abdominal   Peds  Hematology negative hematology ROS (+)   Anesthesia Other Findings Day of surgery medications reviewed with the patient.  Reproductive/Obstetrics                            Anesthesia Physical Anesthesia Plan  ASA: III  Anesthesia Plan: MAC   Post-op Pain Management:    Induction: Intravenous  Airway Management Planned: Nasal Cannula  Additional Equipment:   Intra-op Plan:   Post-operative Plan:   Informed Consent: I have reviewed the patients History and Physical, chart, labs and discussed the procedure including the risks, benefits and alternatives for the proposed anesthesia with the patient or authorized representative who has indicated his/her understanding and acceptance.   Dental advisory given  Plan Discussed with: CRNA and Anesthesiologist  Anesthesia Plan Comments: (Discussed risks/benefits/alternatives to MAC sedation including need for ventilatory support, hypotension, need for conversion to general anesthesia.  All patient questions  answered.  Patient/guardian wishes to proceed.)        Anesthesia Quick Evaluation

## 2015-07-01 NOTE — H&P (Signed)
Patient interval history reviewed.  Patient examined again.  There has been no change from documented H/P dated 06/02/15 (scanned into chart from our office) except as documented above.  Assessment:  1.  Pyloric channel ulcer; evaluate for ulcer healing.  Plan:  1.  Endoscopy. 2.  Risks (up to and including bleeding, infection, perforation, pancreatitis that can be complicated by infected necrosis and death), benefits (removal of stones, alleviating blockage, decreasing risk of cholangitis or choledocholithiasis-related pancreatitis), and alternatives (watchful waiting, percutaneous transhepatic cholangiography) of ERCP were explained to patient/family in detail and patient elects to proceed.

## 2015-07-02 ENCOUNTER — Encounter (HOSPITAL_COMMUNITY): Payer: Self-pay | Admitting: Gastroenterology

## 2015-07-08 ENCOUNTER — Other Ambulatory Visit: Payer: Self-pay | Admitting: Gastroenterology

## 2015-07-08 DIAGNOSIS — K259 Gastric ulcer, unspecified as acute or chronic, without hemorrhage or perforation: Secondary | ICD-10-CM

## 2015-07-14 ENCOUNTER — Ambulatory Visit
Admission: RE | Admit: 2015-07-14 | Discharge: 2015-07-14 | Disposition: A | Discharge status: Home / Self Care | Payer: Medicare Other | Source: Ambulatory Visit | Attending: Gastroenterology | Admitting: Gastroenterology

## 2015-07-14 DIAGNOSIS — K259 Gastric ulcer, unspecified as acute or chronic, without hemorrhage or perforation: Secondary | ICD-10-CM

## 2015-08-04 ENCOUNTER — Other Ambulatory Visit: Payer: Self-pay | Admitting: Surgery

## 2015-08-04 NOTE — H&P (Signed)
[name] 08/04/2015 8:29 AM Location: [facility] Patient #: [id number] DOB: 1940-09-08 Married / Language: English / Race: White Male  Patient Care Team: [name], [name] as PCP - General (Internal Medicine) Danny Soda, [name] as Consulting Physician (General Surgery) Danny Modena, [name] as Consulting Physician (Gastroenterology)   History of Present Illness [name]; 08/04/2015 1:41 PM) The patient is a 75 year old male who presents with a gastric outlet obstruction. Note for "Gastric outlet obstruction": Patient sent for surgical consultation for concern of pyloric stricture most likely due to ulcer disease. Request by Dr. Chrissie [name] outflow with [facility] physicians.  Pleasant, active, elderly gentleman. Our group abdominal laparoscopic cholecystectomy on him about 10 years ago. Dr. Magnus [name]. Has had episodes of epigastric pain and discomfort intermittently over the past year. Was treated for Helicobacter pylori in May 2016. Triple antibiotic therapy. Some persistent symptoms. Saw gastroenterology. Endoscopy in January 2017 showed partial gastric outlet obstruction with pyloric channel ulcer. Placed on carafate and pantoprazole twice a day. No improvement on f/o EGD May 2017. Biopsies negative for cancer. Helicobacter pylori negative. Patient does not take any nonsteroidals. Patient's had worsening episodes of nausea and bloating. Has had some episodes of severe nausea and vomiting. Worst episode back in December. He can tolerate some solids foods but has had to switch to smaller meals and drink plenty of liquids. He has been losing weight: ~ 20lbs in the last 1.5 years, somewhat unintentionally. He exercises 45 minutes a few times a week with a exercise group. Has been getting increasingly constipated. He wonders if it's due to the Carafate. Been using a stool softener that's kind of help compensate. Followed by a cardiologist for coronary disease.  Stents in 2009. Just on low-dose aspirin now.  No personal nor family history of GI/colon cancer, inflammatory bowel disease, irritable bowel syndrome, allergy such as Celiac Sprue, dietary/dairy problems, colitis, ulcers nor gastritis. No recent sick contacts/gastroenteritis. No travel outside the country. No changes in diet. No dysphagia to solids or liquids. No significant heartburn or reflux. No hematochezia, hematemesis, coffee ground emesis. No evidence of prior gastric/peptic ulceration.   Other Problems [name], CMA; 08/04/2015 8:29 AM) Back Pain Enlarged Prostate Gastroesophageal Reflux Disease High blood pressure Hypercholesterolemia  Past Surgical History [name], CMA; 08/04/2015 8:29 AM) Gallbladder Surgery - Laparoscopic Oral Surgery  Diagnostic Studies History [name], CMA; 08/04/2015 8:29 AM) Colonoscopy 1-5 years ago  Allergies [name], CMA; 08/04/2015 8:30 AM) No Known Drug Allergies 08/04/2015  Medication History ([name], CMA; 08/04/2015 8:32 AM) Atorvastatin Calcium (40MG  Tablet, Oral) Active. Pantoprazole Sodium (40MG  Tablet DR, Oral) Active. Restasis (0.05% Emulsion, Ophthalmic) Active. Valsartan (80MG  Tablet, Oral) Active. Baby Aspirin (81MG  Tablet Chewable, Oral) Active. Multivitamins (Oral) Active. Nitrostat (0.4MG  Tab Sublingual, Sublingual as needed) Active. Medications Reconciled  Social History Ethlyn Gallery, CMA; 08/04/2015 8:29 AM) Alcohol use Occasional alcohol use. Caffeine use Carbonated beverages, Coffee, Tea. No drug use Tobacco use Former smoker.  Family History Ethlyn Gallery, CMA; 08/04/2015 8:29 AM) Cancer Sister. Cerebrovascular Accident Mother. Diabetes Mellitus Brother, Mother, Sister. Heart Disease Brother, Mother, Sister. Hypertension Brother, Mother, Sister. Melanoma Brother. Migraine Headache Mother, Sister. Respiratory Condition  Father.     Review of Systems [name] CMA; 08/04/2015 8:29 AM) General Present- Fatigue and Weight Loss. Not Present- Appetite Loss, Chills, Fever, Night Sweats and Weight Gain. Skin Not Present- Change in Wart/Mole, Dryness, Hives, Jaundice, New Lesions, Non-Healing Wounds, Rash and Ulcer. HEENT Present- Hoarseness and Wears glasses/contact lenses. Not  Present- Earache, Hearing Loss, Nose Bleed, Oral Ulcers, Ringing in the Ears, Seasonal Allergies, Sinus Pain, Sore Throat, Visual Disturbances and Yellow Eyes. Respiratory Not Present- Bloody sputum, Chronic Cough, Difficulty Breathing, Snoring and Wheezing. Breast Not Present- Breast Mass, Breast Pain, Nipple Discharge and Skin Changes. Cardiovascular Not Present- Chest Pain, Difficulty Breathing Lying Down, Leg Cramps, Palpitations, Rapid Heart Rate, Shortness of Breath and Swelling of Extremities. Gastrointestinal Present- Abdominal Pain, Bloating, Change in Bowel Habits, Constipation, Difficulty Swallowing, Excessive gas and Gets full quickly at meals. Not Present- Bloody Stool, Chronic diarrhea, Hemorrhoids, Indigestion, Nausea, Rectal Pain and Vomiting. Male Genitourinary Present- Impotence. Not Present- Blood in Urine, Change in Urinary Stream, Frequency, Nocturia, Painful Urination, Urgency and Urine Leakage. Musculoskeletal Present- Back Pain and Muscle Weakness. Not Present- Joint Pain, Joint Stiffness, Muscle Pain and Swelling of Extremities.  Vitals ([name] CMA; 08/04/2015 8:30 AM) 08/04/2015 8:29 AM Weight: 196 lb Height: 72in Body Surface Area: 2.11 m Body Mass Index: 26.58 kg/m  Pulse: 56 (Regular)  BP: 124/74 (Sitting, Left Arm, Standard)      Physical Exam [name]; 08/04/2015 1:41 PM)  General Mental Status-Alert. General Appearance-Not in acute distress, Not Sickly. Orientation-Oriented X3. Hydration-Well hydrated. Voice-Normal.  Integumentary Global Assessment Upon  inspection and palpation of skin surfaces of the - Axillae: non-tender, no inflammation or ulceration, no drainage. and Distribution of scalp and body hair is normal. General Characteristics Temperature - normal warmth is noted.  Head and Neck Head-normocephalic, atraumatic with no lesions or palpable masses. Face Global Assessment - atraumatic, no absence of expression. Neck Global Assessment - no abnormal movements, no bruit auscultated on the right, no bruit auscultated on the left, no decreased range of motion, non-tender. Trachea-midline. Thyroid Gland Characteristics - non-tender.  Eye Eyeball - Left-Extraocular movements intact, No Nystagmus. Eyeball - Right-Extraocular movements intact, No Nystagmus. Cornea - Left-No Hazy. Cornea - Right-No Hazy. Sclera/Conjunctiva - Left-No scleral icterus, No Discharge. Sclera/Conjunctiva - Right-No scleral icterus, No Discharge. Pupil - Left-Direct reaction to light normal. Pupil - Right-Direct reaction to light normal.  ENMT Ears Pinna - Left - no drainage observed, no generalized tenderness observed. Right - no drainage observed, no generalized tenderness observed. Nose and Sinuses External Inspection of the Nose - no destructive lesion observed. Inspection of the nares - Left - quiet respiration. Right - quiet respiration. Mouth and Throat Lips - Upper Lip - no fissures observed, no pallor noted. Lower Lip - no fissures observed, no pallor noted. Nasopharynx - no discharge present. Oral Cavity/Oropharynx - Tongue - no dryness observed. Oral Mucosa - no cyanosis observed. Hypopharynx - no evidence of airway distress observed. Note: Mild hoarseness.  Chest and Lung Exam Inspection Movements - Normal and Symmetrical. Accessory muscles - No use of accessory muscles in breathing. Palpation Palpation of the chest reveals - Non-tender. Auscultation Breath sounds - Normal and  Clear.  Cardiovascular Auscultation Rhythm - Regular. Murmurs & Other Heart Sounds - Auscultation of the heart reveals - No Murmurs and No Systolic Clicks.  Abdomen Inspection Inspection of the abdomen reveals - No Visible peristalsis and No Abnormal pulsations. Umbilicus - No Bleeding, No Urine drainage. Palpation/Percussion Palpation and Percussion of the abdomen reveal - Soft, Non Tender, No Rebound tenderness, No Rigidity (guarding) and No Cutaneous hyperesthesia. Note: Abdomen soft. Nontender, nondistended. No guarding. No umbilical no other hernias  Male Genitourinary Sexual Maturity Tanner 5 - Adult hair pattern and Adult penile size and shape. Note: No inguinal hernias. Normal external genitalia. Epididymi, testes, and  spermatic cords normal without any masses.  Peripheral Vascular Upper Extremity Inspection - Left - No Cyanotic nailbeds, Not Ischemic. Right - No Cyanotic nailbeds, Not Ischemic.  Neurologic Neurologic evaluation reveals -normal attention span and ability to concentrate, able to name objects and repeat phrases. Appropriate fund of knowledge , normal sensation and normal coordination. Mental Status Affect - not angry, not paranoid. Cranial Nerves-Normal Bilaterally. Gait-Normal.  Neuropsychiatric Mental status exam performed with findings of-able to articulate well with normal speech/language, rate, volume and coherence, thought content normal with ability to perform basic computations and apply abstract reasoning and no evidence of hallucinations, delusions, obsessions or homicidal/suicidal ideation. Note: Somewhat slow in answering but mental status clear & knowledge excellent.  Musculoskeletal Global Assessment Spine, Ribs and Pelvis - no instability, subluxation or laxity. Right Upper Extremity - no instability, subluxation or laxity.  Lymphatic Head & Neck  General Head & Neck Lymphatics: Bilateral - Description - No Localized  lymphadenopathy. Axillary  General Axillary Region: Bilateral - Description - No Localized lymphadenopathy. Femoral & Inguinal  Generalized Femoral & Inguinal Lymphatics: Left - Description - No Localized lymphadenopathy. Right - Description - No Localized lymphadenopathy.    Assessment & Plan [name]; 08/04/2015 1:43 PM)  PYLORIC STRICTURE (K31.1) Impression: Patient with worsening pyloric stricture and partial gastric outlet obstruction. Very narrowed lumen.  No longer H. pylori positive. Biopsy negative for malignancy. He does not take nonsteroidals.  He's not had improvement despite high dose acid suppression including combinational Tx.  I would check a gastrin level to rule out Zollinger-Ellison syndrome. Probably not too likely but wished to rule out atypical sources since this is not a typical course for ulcerative disease.  Would proceed with vagotomy and antrectomy. Most likely truncal versus anterior highly selective and posterior truncal to minimize recurrence. Try Billroth I anastomosis if there is mobility. Otherwise to Billroth II. Robotic to minimize larger incision.  Discussed at length with the patient, his wife, and his daughter. Questions answered. They feel comfortable proceeding. He exercises regularly and does not smoke. Hopefully his operative risks are relatively low.  I did caution that recovery can be slow and gastric ileus is common. Takes months to eventually get back to a more regular type diet. Hopefully because he's not completely obstructed nor malnourished, he will recover relatively smoothly.  Current Plans You are being scheduled for surgery - Our schedulers will call you.  You should hear from our office's scheduling department within 5 working days about the location, date, and time of surgery. We try to make accommodations for patient's preferences in scheduling surgery, but sometimes the OR schedule or the surgeon's schedule prevents  [place] from making those accommodations.  If you have not heard from our office [phone]) in 5 working days, call the office and ask for your surgeon's nurse.  If you have other questions about your diagnosis, plan, or surgery, call the office and ask for your surgeon's nurse.  Follow Up - Call [facility] office after tests / studies done to discuss further plans Pt Education - [facility] Esophageal Surgery Diet HCI (Katye Valek): discussed with patient and provided information. Pt Education - [facility] Laparoscopic Surgery HCI The anatomy & physiology of the foregut and digestive tract was discussed. The gastric pathophysiology was discussed. Natural history risks without surgery was discussed. The patient's situation is not adequately controlled by medicines and other non-operative treatments. I feel the risks of no intervention will lead to serious problems that outweigh the operative risks; therefore, I  recommended surgery to resect part of the stomach. Need for a thorough workup to rule out the differential diagnosis and plan treatment was explained. I explained laparoscopic techniques with possible need for an open approach.  Risks such as bleeding, infection, abscess, leak, need for further treatment, heart attack, death, and other risks were discussed. I noted a good likelihood this will help address the problem. Goals of post-operative recovery were discussed as well. Possibility that this will not correct all symptoms was explained. Post-operative dysphagia, need for short-term liquid & pureed diet, inability to vomit, possibility of reherniation, possible need for medicines to help control symptoms in addition to surgery were discussed. Possible need for a feeding tube was discussed. We will work to minimize complications. Educational handouts further explaining the pathology, treatment options, and dysphagia diet was given as well. Questions were answered. The patient expresses understanding & wishes to proceed with  surgery.  Danny SportsmanSteven C. Swati Granberry, M.D., F.A.C.S. Gastrointestinal and Minimally Invasive Surgery [facility] [address] [place], [place] [id number] [phone] Main / Paging

## 2015-08-28 ENCOUNTER — Encounter: Payer: Self-pay | Admitting: Neurology

## 2015-08-28 ENCOUNTER — Ambulatory Visit (INDEPENDENT_AMBULATORY_CARE_PROVIDER_SITE_OTHER): Payer: Medicare Other | Admitting: Neurology

## 2015-08-28 VITALS — BP 126/60 | HR 76 | Ht 72.0 in | Wt 201.0 lb

## 2015-08-28 DIAGNOSIS — R490 Dysphonia: Secondary | ICD-10-CM

## 2015-08-28 NOTE — Progress Notes (Signed)
NEUROLOGY CONSULTATION NOTE  [name] MRN: [id number] DOB: 23-Nov-1940  Referring provider: Dr. Valentina Lucks Primary care provider: Dr. Valentina Lucks  Reason for consult:  dysphonia  HISTORY OF PRESENT ILLNESS: [name] is a 75 year old man with GERD and H. pylori gastritis, peptic ulcer, CAD, hypertension and remote history of cigarette smoking who presents for dysphonia.  History obtained by patient, ENT and PCP notes.  Beginning in January 2016, he developed severe GERD presenting as abdominal pain and indigestion, including vomiting.  He was found to have a peptic ulcer.  Despite conservative management, he continues to have acid reflex.  He most recently had an upper GI endoscopy on 07/01/15 which demonstrated non-bleeding gastric ulcer.  He is scheduled next month for partial gastrectomy and vagotomy.  Since onset of GERD last year, he has had hoarseness of his voice.  He also reports that his speech is slowed and sometimes more difficult to pronounce.  Symptoms have been stable and not progressed.  He denies difficulty swallowing.  He denies memory changes, tremor, diplopia, muscle weakness, or gait instability.    He saw ENT in November 2016.  Laryngoscopy demonstrated normal voice of tongue, supraglottis and larynx, with no nodule or mass lesions, and no evidence of vocal cord dysfunction.  However, he did demonstrate inflammation of the vocal cords due to acid reflex.    CXR from 06/11/15 revealed COPD without acute findings or mass lesions.  MRI of brain without contrast from 06/20/15 was personally reviewed and revealed mild to moderate global atrophy and mild chronic small vessel ischemic changes, but no acute findings or mass lesion.  His mother had stroke.  He has no family history of motor neuron disease or  neurodegenerative diseases such as Parkinson's or Alzheimer's.  PAST MEDICAL HISTORY: Past Medical History  Diagnosis Date  . Hyperlipidemia   . Hypertension   .  Coronary artery disease 1997    bare mental stent right coronart artery  . Gastroesophageal reflux disease   . ED (erectile dysfunction)   . Degenerative disc disease     LOWER BACK    PAST SURGICAL HISTORY: Past Surgical History  Procedure Laterality Date  . Stent to heart      2 1997 AND 1 IN 1996  . Cholecystectomy    . Esophagogastroduodenoscopy (egd) with propofol N/A 07/01/2015    Procedure: ESOPHAGOGASTRODUODENOSCOPY (EGD) WITH PROPOFOL;  Surgeon: Willis Modena, MD;  Location: [facility];  Service: Endoscopy;  Laterality: N/A;    MEDICATIONS: Current Outpatient Prescriptions on File Prior to Visit  Medication Sig Dispense Refill  . aspirin EC 81 MG tablet Take 81 mg by mouth daily.    [name] atorvastatin (LIPITOR) 40 MG tablet Take 40 mg by mouth at bedtime.     . Multiple Vitamin (MULTIVITAMIN) tablet Take 1 tablet by mouth daily.    . nitroGLYCERIN (NITROSTAT) 0.4 MG SL tablet Place 0.4 mg under the tongue every 5 (five) minutes as needed for chest pain.    . pantoprazole (PROTONIX) 40 MG tablet Take 40 mg by mouth 2 (two) times daily.    . Polyvinyl Alcohol-Povidone (REFRESH OP) Place 2 drops into both eyes daily as needed (For dry eyes.).    [name] Probiotic Product (PROBIOTIC DAILY PO) Take 1 capsule by mouth daily.    . RESTASIS 0.05 % ophthalmic emulsion Place 1 drop into both eyes 2 (two) times daily.      No current facility-administered medications on file prior to visit.  ALLERGIES: Allergies  Allergen Reactions  . Flomax [Tamsulosin Hcl] Other (See Comments)    Leg weakness  . Lisinopril Cough  . Uroxatral [Alfuzosin Hcl Er] Other (See Comments)    Leg weakness    FAMILY HISTORY: Family History  Problem Relation Age of Onset  . Heart attack Mother     SOCIAL HISTORY: Social History   Social History  . Marital Status: Married    Spouse Name: N/A  . Number of Children: N/A  . Years of Education: N/A   Occupational History  . Not on file.    Social History Main Topics  . Smoking status: Former Smoker -- 2.00 packs/day for 30 years    Types: Cigarettes    Quit date: 02/15/1995  . Smokeless tobacco: Never Used  . Alcohol Use: No  . Drug Use: No  . Sexual Activity: Not on file   Other Topics Concern  . Not on file   Social History Narrative    REVIEW OF SYSTEMS: Constitutional: No fevers, chills, or sweats, no generalized fatigue, change in appetite Eyes: No visual changes, double vision, eye pain Ear, nose and throat: No hearing loss, ear pain, nasal congestion, sore throat Cardiovascular: No chest pain, palpitations Respiratory:  No shortness of breath at rest or with exertion, wheezes GastrointestinaI: as above. Genitourinary:  No dysuria, urinary retention or frequency Musculoskeletal:  No neck pain, back pain Integumentary: No rash, pruritus, skin lesions Neurological: as above Psychiatric: No depression, insomnia, anxiety Endocrine: No palpitations, fatigue, diaphoresis, mood swings, change in appetite, change in weight, increased thirst Hematologic/Lymphatic:  No purpura, petechiae. Allergic/Immunologic: no itchy/runny eyes, nasal congestion, recent allergic reactions, rashes  PHYSICAL EXAM: Filed Vitals:   08/28/15 0943  BP: 126/60  Pulse: 76   General: No acute distress.  Patient appears well-groomed.  Head:  Normocephalic/atraumatic Eyes:  fundi examined but not visualized Neck: supple, no paraspinal tenderness, full range of motion Back: No paraspinal tenderness Heart: regular rate and rhythm Lungs: Clear to auscultation bilaterally. Vascular: No carotid bruits. Neurological Exam: Mental status: alert and oriented to person, place, and time, recent and remote memory intact, fund of knowledge intact, attention and concentration intact, speech fluent and not dysarthric, language intact. Cranial nerves: CN I: not tested CN II: pupils equal, round and reactive to light, visual fields intact CN  III, IV, VI:  full range of motion, no nystagmus, no ptosis CN V: facial sensation intact CN VII: upper and lower face symmetric CN VIII: hearing intact CN IX, X: gag intact, uvula midline CN XI: sternocleidomastoid and trapezius muscles intact CN XII: tongue midline Bulk & Tone: normal, no fasciculations. Motor:  5/5 throughout  Sensation: temperature and vibration sensation intact. Deep Tendon Reflexes:  2+ throughout, toes downgoing. Finger to nose testing:  Without dysmetria.  Heel to shin:  Without dysmetria.  Gait:  Normal station and stride.  Able to turn and tandem walk. Romberg negative.  IMPRESSION: Dysphonia.  I think symptoms, including the slowed articulation, are likely due to inflammation of the GERD.  He does not exhibit any bulbar symptoms.  Gag reflex is intact.  Tongue strength is intact.  He does not have muscle atrophy, fasciculations, weakness or tremor.  There is no evidence on exam to suggest motor neuron disease or neurodegenerative disease.  We can perform EMG to assess for motor neuron disease in the bulbar muscles, but I think that is premature, considering he does not demonstrate any signs on neurologic exam.    PLAN: 1.  I would see how he does over the coming weeks after his surgery.  If he should develop worsening or new symptoms, such as slurred speech with inability to articulate words, dysphagia or weakness, then he was instructed to contact us for re-evaluation.    Thank you for allowing me to take part in the care of this patient.  Shon MilletAdam Mariha Sleeper, DO  CC:  Kirby FunkJohn Griffin, MD

## 2015-08-28 NOTE — Patient Instructions (Signed)
I think the problem with your voice is likely due to the inflammation of your vocal cords.  Your neurologic exam is normal.  MRI of the brain looked okay.  I would see how you do over the months after the surgery.  If symptoms get worse or if you develop new symptoms then please contact me so I can re-evaluate you.

## 2015-08-28 NOTE — Progress Notes (Signed)
Chart forwarded.  

## 2015-09-02 ENCOUNTER — Encounter (HOSPITAL_COMMUNITY)
Admission: RE | Admit: 2015-09-02 | Discharge: 2015-09-02 | Disposition: A | Discharge status: Home / Self Care | Payer: Medicare Other | Source: Ambulatory Visit | Attending: Surgery | Admitting: Surgery

## 2015-09-02 ENCOUNTER — Encounter (HOSPITAL_COMMUNITY): Payer: Self-pay

## 2015-09-02 DIAGNOSIS — Z01810 Encounter for preprocedural cardiovascular examination: Secondary | ICD-10-CM | POA: Diagnosis not present

## 2015-09-02 DIAGNOSIS — Z01812 Encounter for preprocedural laboratory examination: Secondary | ICD-10-CM | POA: Diagnosis present

## 2015-09-02 HISTORY — DX: Pneumonia, unspecified organism: J18.9

## 2015-09-02 HISTORY — DX: Presence of spectacles and contact lenses: Z97.3

## 2015-09-02 HISTORY — DX: Peptic ulcer, site unspecified, unspecified as acute or chronic, without hemorrhage or perforation: K27.9

## 2015-09-02 HISTORY — DX: Dry eye syndrome of bilateral lacrimal glands: H04.123

## 2015-09-02 HISTORY — DX: Dysphonia: R49.0

## 2015-09-02 HISTORY — DX: Hesitancy of micturition: R39.11

## 2015-09-02 LAB — CBC
HEMATOCRIT: 40.2 % (ref 39.0–52.0)
HEMOGLOBIN: 13.8 g/dL (ref 13.0–17.0)
MCH: 31.4 pg (ref 26.0–34.0)
MCHC: 34.3 g/dL (ref 30.0–36.0)
MCV: 91.6 fL (ref 78.0–100.0)
Platelets: 285 10*3/uL (ref 150–400)
RBC: 4.39 MIL/uL (ref 4.22–5.81)
RDW: 12.1 % (ref 11.5–15.5)
WBC: 7.2 10*3/uL (ref 4.0–10.5)

## 2015-09-02 LAB — BASIC METABOLIC PANEL
ANION GAP: 7 (ref 5–15)
BUN: 19 mg/dL (ref 6–20)
CHLORIDE: 105 mmol/L (ref 101–111)
CO2: 26 mmol/L (ref 22–32)
Calcium: 9.3 mg/dL (ref 8.9–10.3)
Creatinine, Ser: 1.07 mg/dL (ref 0.61–1.24)
GFR calc Af Amer: >60 mL/min (ref >60)
GFR calc non Af Amer: >60 mL/min (ref >60)
Glucose, Bld: 79 mg/dL (ref 65–99)
POTASSIUM: 4.1 mmol/L (ref 3.5–5.1)
SODIUM: 138 mmol/L (ref 135–145)

## 2015-09-02 NOTE — Patient Instructions (Signed)
Ardyth GalRalph T Traughber  09/02/2015   Your procedure is scheduled on: Friday September 25, 2015   [facility]  [place] to 3rd floor to  Short Stay Center at 7:15 AM.  Call this number if you have problems the morning of surgery [phone]   Remember: ONLY 1 PERSON MAY GO WITH YOU TO SHORT STAY TO GET  READY MORNING OF YOUR SURGERY.  Do not eat food or drink liquids :After Midnight.     Take these medicines the morning of surgery with A SIP OF WATER: Pantoprazole; May use eye drops if needed                                You may not have any metal on your body including hair pins and              piercings  Do not wear jewelry,  lotions, powders or colognes, deodorant                           Men may shave face and neck.   Do not bring valuables to the hospital. [facility] IS NOT             RESPONSIBLE   FOR VALUABLES.  Contacts, dentures or bridgework may not be worn into surgery.  Leave suitcase in the car. After surgery it may be brought to your room.   _____________________________________________________________________             [facility] - Preparing for Surgery Before surgery, you can play an important role.  Because skin is not sterile, your skin needs to be as free of germs as possible.  You can reduce the number of germs on your skin by washing with CHG (chlorahexidine gluconate) soap before surgery.  CHG is an antiseptic cleaner which kills germs and bonds with the skin to continue killing germs even after washing. Please DO NOT use if you have an allergy to CHG or antibacterial soaps.  If your skin becomes reddened/irritated stop using the CHG and inform your nurse when you arrive at Short Stay. Do not shave (including legs and underarms) for at least 48 hours prior to the first CHG shower.  You may shave your face/neck. Please follow these instructions carefully:  1.  Shower with CHG Soap the night before  surgery and the  morning of Surgery.  2.  If you choose to wash your hair, wash your hair first as usual with your  normal  shampoo.  3.  After you shampoo, rinse your hair and body thoroughly to remove the  shampoo.                           4.  Use CHG as you would any other liquid soap.  You can apply chg directly  to the skin and wash                       Gently with a scrungie or clean washcloth.  5.  Apply the CHG Soap to your body ONLY FROM THE NECK DOWN.   Do not use on face/ open  Wound or open sores. Avoid contact with eyes, ears mouth and genitals (private parts).                       Wash face,  Genitals (private parts) with your normal soap.             6.  Wash thoroughly, paying special attention to the area where your surgery  will be performed.  7.  Thoroughly rinse your body with warm water from the neck down.  8.  DO NOT shower/wash with your normal soap after using and rinsing off  the CHG Soap.                9.  Pat yourself dry with a clean towel.            10.  Wear clean pajamas.            11.  Place clean sheets on your bed the night of your first shower and do not  sleep with pets. Day of Surgery : Do not apply any lotions/deodorants the morning of surgery.  Please wear clean clothes to the hospital/surgery center.  FAILURE TO FOLLOW THESE INSTRUCTIONS MAY RESULT IN THE CANCELLATION OF YOUR SURGERY PATIENT SIGNATURE_________________________________  NURSE SIGNATURE__________________________________  ________________________________________________________________________

## 2015-09-02 NOTE — Progress Notes (Signed)
Please review surgical orders/epic for surgery scheduled on 09/25/2015. Thanks. Pt had preop appt 09/02/2015.

## 2015-09-02 NOTE — Progress Notes (Signed)
EKG per chart 03/18/2013

## 2015-09-08 ENCOUNTER — Ambulatory Visit: Payer: Self-pay | Admitting: Surgery

## 2015-09-25 ENCOUNTER — Encounter (HOSPITAL_COMMUNITY): Payer: Self-pay

## 2015-09-25 ENCOUNTER — Inpatient Hospital Stay (HOSPITAL_COMMUNITY): Payer: Medicare Other | Admitting: Anesthesiology

## 2015-09-25 ENCOUNTER — Encounter (HOSPITAL_COMMUNITY)
Admission: RE | Disposition: A | Discharge status: Home Health | Payer: Self-pay | Source: Ambulatory Visit | Attending: Surgery

## 2015-09-25 ENCOUNTER — Inpatient Hospital Stay (HOSPITAL_COMMUNITY)
Admission: RE | Admit: 2015-09-25 | Discharge: 2015-10-01 | DRG: 328 | Disposition: A | Discharge status: Home Health | Payer: Medicare Other | Source: Ambulatory Visit | Attending: Surgery | Admitting: Surgery

## 2015-09-25 DIAGNOSIS — K219 Gastro-esophageal reflux disease without esophagitis: Secondary | ICD-10-CM | POA: Diagnosis present

## 2015-09-25 DIAGNOSIS — Z955 Presence of coronary angioplasty implant and graft: Secondary | ICD-10-CM

## 2015-09-25 DIAGNOSIS — F411 Generalized anxiety disorder: Secondary | ICD-10-CM | POA: Diagnosis present

## 2015-09-25 DIAGNOSIS — Z87891 Personal history of nicotine dependence: Secondary | ICD-10-CM | POA: Diagnosis not present

## 2015-09-25 DIAGNOSIS — I10 Essential (primary) hypertension: Secondary | ICD-10-CM | POA: Diagnosis present

## 2015-09-25 DIAGNOSIS — I2510 Atherosclerotic heart disease of native coronary artery without angina pectoris: Secondary | ICD-10-CM | POA: Diagnosis present

## 2015-09-25 DIAGNOSIS — K5900 Constipation, unspecified: Secondary | ICD-10-CM | POA: Diagnosis present

## 2015-09-25 DIAGNOSIS — E7800 Pure hypercholesterolemia, unspecified: Secondary | ICD-10-CM | POA: Diagnosis present

## 2015-09-25 DIAGNOSIS — Z79899 Other long term (current) drug therapy: Secondary | ICD-10-CM

## 2015-09-25 DIAGNOSIS — K311 Adult hypertrophic pyloric stenosis: Secondary | ICD-10-CM | POA: Diagnosis present

## 2015-09-25 DIAGNOSIS — H18459 Nodular corneal degeneration, unspecified eye: Secondary | ICD-10-CM | POA: Diagnosis present

## 2015-09-25 DIAGNOSIS — N400 Enlarged prostate without lower urinary tract symptoms: Secondary | ICD-10-CM | POA: Diagnosis present

## 2015-09-25 DIAGNOSIS — Z8711 Personal history of peptic ulcer disease: Secondary | ICD-10-CM

## 2015-09-25 DIAGNOSIS — E785 Hyperlipidemia, unspecified: Secondary | ICD-10-CM | POA: Diagnosis present

## 2015-09-25 DIAGNOSIS — R1013 Epigastric pain: Secondary | ICD-10-CM | POA: Diagnosis present

## 2015-09-25 HISTORY — PX: XI ROBOTIC VAGOTOMY AND ANTRECTOMY: SHX6664

## 2015-09-25 HISTORY — DX: Adult hypertrophic pyloric stenosis: K31.1

## 2015-09-25 LAB — ABO/RH: ABO/RH(D): A POS

## 2015-09-25 LAB — TYPE AND SCREEN
ABO/RH(D): A POS
Antibody Screen: NEGATIVE

## 2015-09-25 SURGERY — XI ROBOTIC VAGOTOMY AND ANTRECTOMY
Anesthesia: General | Site: Abdomen

## 2015-09-25 MED ORDER — ONDANSETRON HCL 4 MG/2ML IJ SOLN: 4.0000 mg | Freq: Four times a day (QID) | INTRAMUSCULAR | Status: DC | PRN

## 2015-09-25 MED ORDER — DEXAMETHASONE SODIUM PHOSPHATE 10 MG/ML IJ SOLN
INTRAMUSCULAR | Status: AC
  Filled 2015-09-25: qty 1

## 2015-09-25 MED ORDER — LIDOCAINE HCL (CARDIAC) 20 MG/ML IV SOLN
INTRAVENOUS | Status: AC
  Filled 2015-09-25: qty 5

## 2015-09-25 MED ORDER — FENTANYL CITRATE (PF) 100 MCG/2ML IJ SOLN
25.0000 ug | INTRAMUSCULAR | Status: DC | PRN
  Administered 2015-09-25: 25 ug via INTRAVENOUS
  Administered 2015-09-25: 50 ug via INTRAVENOUS
  Administered 2015-09-25: 25 ug via INTRAVENOUS

## 2015-09-25 MED ORDER — FENTANYL CITRATE (PF) 250 MCG/5ML IJ SOLN
INTRAMUSCULAR | Status: AC
  Filled 2015-09-25: qty 5

## 2015-09-25 MED ORDER — PROPOFOL 10 MG/ML IV BOLUS
INTRAVENOUS | Status: AC
  Filled 2015-09-25: qty 20

## 2015-09-25 MED ORDER — ONDANSETRON HCL 4 MG/2ML IJ SOLN
INTRAMUSCULAR | Status: AC
  Filled 2015-09-25: qty 2

## 2015-09-25 MED ORDER — BUPIVACAINE LIPOSOME 1.3 % IJ SUSP
20.0000 mL | INTRAMUSCULAR | Status: DC
  Filled 2015-09-25: qty 20

## 2015-09-25 MED ORDER — HYDROMORPHONE HCL 2 MG/ML IJ SOLN
INTRAMUSCULAR | Status: AC
Start: 2015-09-25 — End: 2015-09-25
  Filled 2015-09-25: qty 1

## 2015-09-25 MED ORDER — ROCURONIUM BROMIDE 100 MG/10ML IV SOLN
INTRAVENOUS | Status: DC | PRN
  Administered 2015-09-25 (×2): 10 mg via INTRAVENOUS
  Administered 2015-09-25: 20 mg via INTRAVENOUS
  Administered 2015-09-25: 10 mg via INTRAVENOUS
  Administered 2015-09-25: 50 mg via INTRAVENOUS

## 2015-09-25 MED ORDER — BUPIVACAINE LIPOSOME 1.3 % IJ SUSP
INTRAMUSCULAR | Status: DC | PRN
  Administered 2015-09-25: 20 mL

## 2015-09-25 MED ORDER — HYDRALAZINE HCL 20 MG/ML IJ SOLN: 10.0000 mg | INTRAMUSCULAR | Status: DC | PRN

## 2015-09-25 MED ORDER — ROCURONIUM BROMIDE 100 MG/10ML IV SOLN
INTRAVENOUS | Status: AC
  Filled 2015-09-25: qty 1

## 2015-09-25 MED ORDER — LACTATED RINGERS IV BOLUS (SEPSIS): 1000.0000 mL | Freq: Three times a day (TID) | INTRAVENOUS | Status: AC | PRN

## 2015-09-25 MED ORDER — CETYLPYRIDINIUM CHLORIDE 0.05 % MT LIQD
7.0000 mL | Freq: Two times a day (BID) | OROMUCOSAL | Status: DC
  Administered 2015-09-25 – 2015-09-30 (×11): 7 mL via OROMUCOSAL

## 2015-09-25 MED ORDER — CYCLOSPORINE 0.05 % OP EMUL
1.0000 [drp] | Freq: Two times a day (BID) | OPHTHALMIC | Status: DC
  Administered 2015-09-25 – 2015-10-01 (×11): 1 [drp] via OPHTHALMIC
  Filled 2015-09-25 (×16): qty 1

## 2015-09-25 MED ORDER — ENOXAPARIN SODIUM 40 MG/0.4ML ~~LOC~~ SOLN
40.0000 mg | SUBCUTANEOUS | Status: DC
  Filled 2015-09-25: qty 0.4

## 2015-09-25 MED ORDER — LIDOCAINE HCL (CARDIAC) 20 MG/ML IV SOLN
INTRAVENOUS | Status: DC | PRN
  Administered 2015-09-25: 50 mg via INTRAVENOUS

## 2015-09-25 MED ORDER — METRONIDAZOLE IN NACL 5-0.79 MG/ML-% IV SOLN
500.0000 mg | INTRAVENOUS | Status: AC
  Administered 2015-09-25: 500 mg via INTRAVENOUS

## 2015-09-25 MED ORDER — ONDANSETRON HCL 4 MG/2ML IJ SOLN
INTRAMUSCULAR | Status: DC | PRN
  Administered 2015-09-25: 4 mg via INTRAVENOUS

## 2015-09-25 MED ORDER — ACETAMINOPHEN 650 MG RE SUPP: 650.0000 mg | Freq: Four times a day (QID) | RECTAL | Status: DC | PRN

## 2015-09-25 MED ORDER — GABAPENTIN 300 MG PO CAPS
300.0000 mg | ORAL_CAPSULE | ORAL | Status: AC
Start: 2015-09-25 — End: 2015-09-25
  Administered 2015-09-25: 300 mg via ORAL
  Filled 2015-09-25: qty 1

## 2015-09-25 MED ORDER — EPHEDRINE SULFATE 50 MG/ML IJ SOLN
INTRAMUSCULAR | Status: AC
  Filled 2015-09-25: qty 1

## 2015-09-25 MED ORDER — SODIUM CHLORIDE 0.9 % IJ SOLN
INTRAMUSCULAR | Status: AC
  Filled 2015-09-25: qty 10

## 2015-09-25 MED ORDER — CHLORHEXIDINE GLUCONATE CLOTH 2 % EX PADS: 6.0000 | MEDICATED_PAD | Freq: Once | CUTANEOUS | Status: DC

## 2015-09-25 MED ORDER — HYDROMORPHONE HCL 1 MG/ML IJ SOLN
0.2500 mg | INTRAMUSCULAR | Status: DC | PRN
  Administered 2015-09-25 (×2): 0.5 mg via INTRAVENOUS

## 2015-09-25 MED ORDER — SUGAMMADEX SODIUM 200 MG/2ML IV SOLN
INTRAVENOUS | Status: DC | PRN
  Administered 2015-09-25: 300 mg via INTRAVENOUS
  Administered 2015-09-25: 200 mg via INTRAVENOUS

## 2015-09-25 MED ORDER — ACETAMINOPHEN 500 MG PO TABS
1000.0000 mg | ORAL_TABLET | ORAL | Status: AC
  Administered 2015-09-25: 1000 mg via ORAL
  Filled 2015-09-25: qty 2

## 2015-09-25 MED ORDER — PROCHLORPERAZINE EDISYLATE 5 MG/ML IJ SOLN: 5.0000 mg | INTRAMUSCULAR | Status: DC | PRN

## 2015-09-25 MED ORDER — ALUM & MAG HYDROXIDE-SIMETH 200-200-20 MG/5ML PO SUSP: 30.0000 mL | Freq: Four times a day (QID) | ORAL | Status: DC | PRN

## 2015-09-25 MED ORDER — HYDRALAZINE HCL 20 MG/ML IJ SOLN: 5.0000 mg | Freq: Four times a day (QID) | INTRAMUSCULAR | Status: DC | PRN

## 2015-09-25 MED ORDER — BUPIVACAINE-EPINEPHRINE 0.25% -1:200000 IJ SOLN
INTRAMUSCULAR | Status: DC | PRN
Start: 2015-09-25 — End: 2015-09-25
  Administered 2015-09-25: 50 mL

## 2015-09-25 MED ORDER — METOPROLOL TARTRATE 12.5 MG HALF TABLET: 12.5000 mg | ORAL_TABLET | Freq: Two times a day (BID) | ORAL | Status: DC | PRN

## 2015-09-25 MED ORDER — HYDROMORPHONE HCL 1 MG/ML IJ SOLN
0.5000 mg | INTRAMUSCULAR | Status: DC | PRN
  Administered 2015-09-25 – 2015-09-27 (×7): 1 mg via INTRAVENOUS
  Administered 2015-09-28: 0.5 mg via INTRAVENOUS
  Administered 2015-09-28: 1 mg via INTRAVENOUS
  Filled 2015-09-25 (×9): qty 1

## 2015-09-25 MED ORDER — PHENOL 1.4 % MT LIQD: 2.0000 | OROMUCOSAL | Status: DC | PRN

## 2015-09-25 MED ORDER — NITROGLYCERIN 0.4 MG SL SUBL: 0.4000 mg | SUBLINGUAL_TABLET | SUBLINGUAL | Status: DC | PRN

## 2015-09-25 MED ORDER — MAGIC MOUTHWASH
15.0000 mL | Freq: Four times a day (QID) | ORAL | Status: DC | PRN
  Administered 2015-09-26: 15 mL via ORAL
  Filled 2015-09-25 (×2): qty 15

## 2015-09-25 MED ORDER — CEFAZOLIN SODIUM-DEXTROSE 2-4 GM/100ML-% IV SOLN
INTRAVENOUS | Status: AC
  Filled 2015-09-25: qty 100

## 2015-09-25 MED ORDER — MENTHOL 3 MG MT LOZG
1.0000 | LOZENGE | OROMUCOSAL | Status: DC | PRN
  Administered 2015-09-26: 3 mg via ORAL
  Filled 2015-09-25 (×3): qty 9

## 2015-09-25 MED ORDER — POLYVINYL ALCOHOL 1.4 % OP SOLN
2.0000 [drp] | Freq: Three times a day (TID) | OPHTHALMIC | Status: DC | PRN
  Filled 2015-09-25: qty 15

## 2015-09-25 MED ORDER — PROPOFOL 10 MG/ML IV BOLUS
INTRAVENOUS | Status: DC | PRN
  Administered 2015-09-25: 140 mg via INTRAVENOUS

## 2015-09-25 MED ORDER — ONDANSETRON 4 MG PO TBDP: 4.0000 mg | ORAL_TABLET | Freq: Four times a day (QID) | ORAL | Status: DC | PRN

## 2015-09-25 MED ORDER — BUPIVACAINE-EPINEPHRINE 0.25% -1:200000 IJ SOLN
INTRAMUSCULAR | Status: AC
Start: 2015-09-25 — End: 2015-09-25
  Filled 2015-09-25: qty 1

## 2015-09-25 MED ORDER — SUGAMMADEX SODIUM 500 MG/5ML IV SOLN
INTRAVENOUS | Status: AC
  Filled 2015-09-25: qty 5

## 2015-09-25 MED ORDER — METOCLOPRAMIDE HCL 5 MG/ML IJ SOLN: 5.0000 mg | Freq: Four times a day (QID) | INTRAMUSCULAR | Status: DC | PRN

## 2015-09-25 MED ORDER — ONDANSETRON HCL 4 MG/2ML IJ SOLN: 4.0000 mg | Freq: Once | INTRAMUSCULAR | Status: DC | PRN

## 2015-09-25 MED ORDER — SIMETHICONE 80 MG PO CHEW: 40.0000 mg | CHEWABLE_TABLET | Freq: Four times a day (QID) | ORAL | Status: DC | PRN

## 2015-09-25 MED ORDER — LIP MEDEX EX OINT
1.0000 "application " | TOPICAL_OINTMENT | Freq: Two times a day (BID) | CUTANEOUS | Status: DC
  Administered 2015-09-25 – 2015-09-30 (×10): 1 via TOPICAL
  Filled 2015-09-25: qty 7

## 2015-09-25 MED ORDER — METRONIDAZOLE IN NACL 5-0.79 MG/ML-% IV SOLN
INTRAVENOUS | Status: AC
  Filled 2015-09-25: qty 100

## 2015-09-25 MED ORDER — PANTOPRAZOLE SODIUM 40 MG PO TBEC
40.0000 mg | DELAYED_RELEASE_TABLET | Freq: Two times a day (BID) | ORAL | Status: DC
  Administered 2015-09-26 – 2015-09-27 (×4): 40 mg via ORAL
  Filled 2015-09-25 (×5): qty 1

## 2015-09-25 MED ORDER — EPHEDRINE SULFATE 50 MG/ML IJ SOLN
INTRAMUSCULAR | Status: DC | PRN
  Administered 2015-09-25: 5 mg via INTRAVENOUS

## 2015-09-25 MED ORDER — DEXAMETHASONE SODIUM PHOSPHATE 4 MG/ML IJ SOLN
INTRAMUSCULAR | Status: DC | PRN
  Administered 2015-09-25: 10 mg via INTRAVENOUS

## 2015-09-25 MED ORDER — METRONIDAZOLE IN NACL 5-0.79 MG/ML-% IV SOLN
500.0000 mg | Freq: Four times a day (QID) | INTRAVENOUS | Status: AC
  Administered 2015-09-25 – 2015-09-26 (×3): 500 mg via INTRAVENOUS
  Filled 2015-09-25 (×3): qty 100

## 2015-09-25 MED ORDER — DIPHENHYDRAMINE HCL 50 MG/ML IJ SOLN
12.5000 mg | Freq: Four times a day (QID) | INTRAMUSCULAR | Status: DC | PRN
  Administered 2015-09-26: 12.5 mg via INTRAVENOUS
  Filled 2015-09-25: qty 1

## 2015-09-25 MED ORDER — CEFAZOLIN SODIUM-DEXTROSE 2-4 GM/100ML-% IV SOLN
2.0000 g | Freq: Three times a day (TID) | INTRAVENOUS | Status: AC
  Administered 2015-09-25 – 2015-09-26 (×2): 2 g via INTRAVENOUS
  Filled 2015-09-25 (×3): qty 100

## 2015-09-25 MED ORDER — CEFAZOLIN SODIUM-DEXTROSE 2-4 GM/100ML-% IV SOLN
2.0000 g | INTRAVENOUS | Status: AC
  Administered 2015-09-25: 2 g via INTRAVENOUS
  Filled 2015-09-25: qty 100

## 2015-09-25 MED ORDER — FENTANYL CITRATE (PF) 100 MCG/2ML IJ SOLN
INTRAMUSCULAR | Status: AC
  Administered 2015-09-25: 25 ug via INTRAVENOUS
  Filled 2015-09-25: qty 2

## 2015-09-25 MED ORDER — LACTATED RINGERS IV SOLN
INTRAVENOUS | Status: DC | PRN
  Administered 2015-09-25 (×3): via INTRAVENOUS

## 2015-09-25 MED ORDER — GUAIFENESIN-DM 100-10 MG/5ML PO SYRP: 15.0000 mL | ORAL_SOLUTION | ORAL | Status: DC | PRN

## 2015-09-25 MED ORDER — LACTATED RINGERS IV SOLN
INTRAVENOUS | Status: DC
  Administered 2015-09-26 – 2015-09-27 (×2): via INTRAVENOUS

## 2015-09-25 MED ORDER — FENTANYL CITRATE (PF) 100 MCG/2ML IJ SOLN
INTRAMUSCULAR | Status: DC | PRN
  Administered 2015-09-25 (×9): 50 ug via INTRAVENOUS
  Administered 2015-09-25 (×2): 25 ug via INTRAVENOUS

## 2015-09-25 MED ORDER — HYDROMORPHONE HCL 1 MG/ML IJ SOLN
INTRAMUSCULAR | Status: AC
  Administered 2015-09-25: 0.5 mg via INTRAVENOUS
  Filled 2015-09-25: qty 1

## 2015-09-25 MED ORDER — METHOCARBAMOL 1000 MG/10ML IJ SOLN
1000.0000 mg | Freq: Four times a day (QID) | INTRAMUSCULAR | Status: DC | PRN
  Filled 2015-09-25: qty 10

## 2015-09-25 MED ORDER — DIPHENHYDRAMINE HCL 12.5 MG/5ML PO ELIX: 12.5000 mg | ORAL_SOLUTION | Freq: Four times a day (QID) | ORAL | Status: DC | PRN

## 2015-09-25 MED ORDER — CEFAZOLIN SODIUM-DEXTROSE 2-4 GM/100ML-% IV SOLN: 2.0000 g | INTRAVENOUS | Status: DC

## 2015-09-25 MED ORDER — SUCCINYLCHOLINE CHLORIDE 20 MG/ML IJ SOLN
INTRAMUSCULAR | Status: DC | PRN
  Administered 2015-09-25: 100 mg via INTRAVENOUS

## 2015-09-25 SURGICAL SUPPLY — 84 items
APPLIER CLIP 5 13 M/L LIGAMAX5 (MISCELLANEOUS)
APR CLP MED LRG 5 ANG JAW (MISCELLANEOUS)
BAG LAPAROSCOPIC 12 15 PORT 16 (BASKET) IMPLANT
BAG RETRIEVAL 12/15 (BASKET) ×2
BAG SPEC RTRVL LRG 6X4 10 (ENDOMECHANICALS)
BLADE SURG SZ11 CARB STEEL (BLADE) ×2 IMPLANT
CANNULA REDUC XI 12-8 STAPL (CANNULA) ×1
CANNULA REDUCER 12-8 DVNC XI (CANNULA) IMPLANT
CHLORAPREP W/TINT 26ML (MISCELLANEOUS) ×2 IMPLANT
CLIP APPLIE 5 13 M/L LIGAMAX5 (MISCELLANEOUS) IMPLANT
CLIP LIGATING HEM O LOK PURPLE (MISCELLANEOUS) IMPLANT
CLIP LIGATING HEMO O LOK GREEN (MISCELLANEOUS) IMPLANT
CLIP LIGATING HEMOLOK MED (MISCELLANEOUS) IMPLANT
COVER TIP SHEARS 8 DVNC (MISCELLANEOUS) ×1 IMPLANT
COVER TIP SHEARS 8MM DA VINCI (MISCELLANEOUS) ×1
DECANTER SPIKE VIAL GLASS SM (MISCELLANEOUS) ×3 IMPLANT
DEVICE TROCAR PUNCTURE CLOSURE (ENDOMECHANICALS) IMPLANT
DRAIN CHANNEL 19F RND (DRAIN) ×1 IMPLANT
DRAPE ARM DVNC X/XI (DISPOSABLE) ×4 IMPLANT
DRAPE COLUMN DVNC XI (DISPOSABLE) ×1 IMPLANT
DRAPE DA VINCI XI ARM (DISPOSABLE) ×4
DRAPE DA VINCI XI COLUMN (DISPOSABLE) ×1
DRAPE WARM FLUID 44X44 (DRAPE) ×2 IMPLANT
DRSG TEGADERM 2-3/8X2-3/4 SM (GAUZE/BANDAGES/DRESSINGS) ×6 IMPLANT
DRSG TEGADERM 4X4.75 (GAUZE/BANDAGES/DRESSINGS) ×2 IMPLANT
ELECT PENCIL ROCKER SW 15FT (MISCELLANEOUS) ×2 IMPLANT
ELECT REM PT RETURN 15FT ADLT (MISCELLANEOUS) ×2 IMPLANT
EVACUATOR SILICONE 100CC (DRAIN) ×1 IMPLANT
GAUZE SPONGE 2X2 8PLY STRL LF (GAUZE/BANDAGES/DRESSINGS) ×1 IMPLANT
GLOVE ECLIPSE 8.0 STRL XLNG CF (GLOVE) ×4 IMPLANT
GLOVE INDICATOR 8.0 STRL GRN (GLOVE) ×4 IMPLANT
GOWN STRL REUS W/TWL XL LVL3 (GOWN DISPOSABLE) ×6 IMPLANT
IRRIG SUCT STRYKERFLOW 2 WTIP (MISCELLANEOUS) ×2
IRRIGATION SUCT STRKRFLW 2 WTP (MISCELLANEOUS) ×1 IMPLANT
KIT BASIN OR (CUSTOM PROCEDURE TRAY) ×2 IMPLANT
NDL INSUFFLATION 14GA 120MM (NEEDLE) ×1 IMPLANT
NEEDLE HYPO 22GX1.5 SAFETY (NEEDLE) ×2 IMPLANT
NEEDLE INSUFFLATION 14GA 120MM (NEEDLE) ×2 IMPLANT
PACK CARDIOVASCULAR III (CUSTOM PROCEDURE TRAY) ×2 IMPLANT
POUCH SPECIMEN RETRIEVAL 10MM (ENDOMECHANICALS) IMPLANT
RELOAD STAPLE 45 3.5 WHT DVNC (STAPLE) IMPLANT
RELOAD STAPLE 45 BLU REG DVNC (STAPLE) IMPLANT
RELOAD STAPLE 45 GRN THCK DVNC (STAPLE) IMPLANT
SCISSORS LAP 5X35 DISP (ENDOMECHANICALS) ×2 IMPLANT
SEAL CANN UNIV 5-8 DVNC XI (MISCELLANEOUS) ×3 IMPLANT
SEAL XI 5MM-8MM UNIVERSAL (MISCELLANEOUS) ×3
SEALER VESSEL DA VINCI XI (MISCELLANEOUS) ×1
SEALER VESSEL EXT DVNC XI (MISCELLANEOUS) ×1 IMPLANT
SET BI-LUMEN FLTR TB AIRSEAL (TUBING) ×2 IMPLANT
SOLUTION ELECTROLUBE (MISCELLANEOUS) ×2 IMPLANT
SPONGE DRAIN TRACH 4X4 STRL 2S (GAUZE/BANDAGES/DRESSINGS) IMPLANT
SPONGE GAUZE 2X2 STER 10/PKG (GAUZE/BANDAGES/DRESSINGS) ×1
SPONGE LAP 18X18 X RAY DECT (DISPOSABLE) ×2 IMPLANT
STAPLER 45 BLU RELOAD XI (STAPLE) ×1 IMPLANT
STAPLER 45 BLUE RELOAD XI (STAPLE) ×1
STAPLER 45 GREEN RELOAD XI (STAPLE) ×3
STAPLER 45 GRN RELOAD XI (STAPLE) ×3 IMPLANT
STAPLER 45 WHITE RELOAD XI (STAPLE)
STAPLER 45 WHT RELOAD XI (STAPLE) IMPLANT
STAPLER CANNULA SEAL DVNC XI (STAPLE) ×1 IMPLANT
STAPLER CANNULA SEAL XI (STAPLE) ×1
STAPLER SHEATH (SHEATH)
STAPLER SHEATH ENDOWRIST DVNC (SHEATH) IMPLANT
SUT ETHIBOND 0 36 GRN (SUTURE) ×3 IMPLANT
SUT MNCRL AB 4-0 PS2 18 (SUTURE) ×2 IMPLANT
SUT PDS AB 1 CT1 27 (SUTURE) ×4 IMPLANT
SUT PROLENE 2 0 CT 1 (SUTURE) ×1 IMPLANT
SUT PROLENE 2 0 SH DA (SUTURE) IMPLANT
SUT SILK 2 0 SH (SUTURE) ×4 IMPLANT
SUT V-LOC BARB 180 2/0GR6 GS22 (SUTURE) ×6
SUT V-LOC BARB 180 2/0GR9 GS23 (SUTURE)
SUT VIC AB 2-0 SH 27 (SUTURE)
SUT VIC AB 2-0 SH 27X BRD (SUTURE) IMPLANT
SUT VICRYL 0 TIES 12 18 (SUTURE) IMPLANT
SUT VICRYL 0 UR6 27IN ABS (SUTURE) IMPLANT
SUTURE V-LC BRB 180 2/0GR6GS22 (SUTURE) IMPLANT
SUTURE V-LC BRB 180 2/0GR9GS23 (SUTURE) IMPLANT
SYR 20CC LL (SYRINGE) ×2 IMPLANT
SYRINGE 10CC LL (SYRINGE) ×2 IMPLANT
TOWEL OR 17X26 10 PK STRL BLUE (TOWEL DISPOSABLE) ×2 IMPLANT
TOWEL OR NON WOVEN STRL DISP B (DISPOSABLE) ×2 IMPLANT
TRAY FOLEY W/METER SILVER 14FR (SET/KITS/TRAYS/PACK) IMPLANT
TRAY FOLEY W/METER SILVER 16FR (SET/KITS/TRAYS/PACK) ×1 IMPLANT
TROCAR ADV FIXATION 5X100MM (TROCAR) ×2 IMPLANT

## 2015-09-25 NOTE — Anesthesia Preprocedure Evaluation (Addendum)
Anesthesia Evaluation  Patient identified by MRN, date of birth, ID band Patient awake    Reviewed: Allergy & Precautions, H&P , NPO status , Patient's Chart, lab work & pertinent test results  Airway Mallampati: III  TM Distance: >3 FB Neck ROM: Full    Dental no notable dental hx. (+) Teeth Intact, Dental Advisory Given   Pulmonary neg pulmonary ROS, former smoker,    Pulmonary exam normal breath sounds clear to auscultation       Cardiovascular hypertension, Pt. on medications + CAD and + Cardiac Stents   Rhythm:Regular Rate:Normal     Neuro/Psych negative neurological ROS  negative psych ROS   GI/Hepatic Neg liver ROS, PUD, GERD  Medicated and Controlled,  Endo/Other  negative endocrine ROS  Renal/GU negative Renal ROS  negative genitourinary   Musculoskeletal  (+) Arthritis , Osteoarthritis,    Abdominal   Peds  Hematology negative hematology ROS (+)   Anesthesia Other Findings   Reproductive/Obstetrics negative OB ROS                            Anesthesia Physical Anesthesia Plan  ASA: III  Anesthesia Plan: General   Post-op Pain Management:    Induction: Intravenous  Airway Management Planned: Oral ETT  Additional Equipment:   Intra-op Plan:   Post-operative Plan: Extubation in OR  Informed Consent: I have reviewed the patients History and Physical, chart, labs and discussed the procedure including the risks, benefits and alternatives for the proposed anesthesia with the patient or authorized representative who has indicated his/her understanding and acceptance.   Dental advisory given  Plan Discussed with: CRNA  Anesthesia Plan Comments:         Anesthesia Quick Evaluation

## 2015-09-25 NOTE — H&P (Signed)
[name] 08/04/2015 8:29 AM [facility] Patient #: [id number] DOB: Jun 24, 1940 Married / Language: English / Race: White Male  Patient Care Team: [name], MD as PCP - General (Internal Medicine) Karie Soda, MD as Consulting Physician (General Surgery) Willis Modena, MD as Consulting Physician (Gastroenterology)    History of Present Illness  The patient is a 75 year old male who presents with a gastric outlet obstruction. Note for "Gastric outlet obstruction": Patient sent for surgical consultation for concern of pyloric stricture most likely due to ulcer disease. Request by Dr. Chrissie Noa outflow with [facility] physicians.  Pleasant, active, elderly gentleman. Our group abdominal laparoscopic cholecystectomy on him about 10 years ago. Dr. Magnus Ivan. Has had episodes of epigastric pain and discomfort intermittently over the past year. Was treated for Helicobacter pylori in May 2016. Triple antibiotic therapy. Some persistent symptoms. Saw gastroenterology. Endoscopy in January 2017 showed partial gastric outlet obstruction with pyloric channel ulcer. Placed on carafate and pantoprazole twice a day. No improvement on f/o EGD May 2017. Biopsies negative for cancer. Helicobacter pylori negative. Patient does not take any nonsteroidals. Patient's had worsening episodes of nausea and bloating. Has had some episodes of severe nausea and vomiting. Worst episode back in December. He can tolerate some solids foods but has had to switch to smaller meals and drink plenty of liquids. He has been losing weight: ~ 20lbs in the last 1.5 years, somewhat unintentionally. He exercises 45 minutes a few times a week with a exercise group. Has been getting increasingly constipated. He wonders if it's due to the Carafate. Been using a stool softener that's kind of help compensate. Followed by a cardiologist for coronary disease. Stents in 2009. Just on low-dose  aspirin now.  No personal nor family history of GI/colon cancer, inflammatory bowel disease, irritable bowel syndrome, allergy such as Celiac Sprue, dietary/dairy problems, colitis, ulcers nor gastritis. No recent sick contacts/gastroenteritis. No travel outside the country. No changes in diet. No dysphagia to solids or liquids. No significant heartburn or reflux. No hematochezia, hematemesis, coffee ground emesis. No evidence of prior gastric/peptic ulceration.   Other Problems [name], CMA; 08/04/2015 8:29 AM) Back Pain Enlarged Prostate Gastroesophageal Reflux Disease High blood pressure Hypercholesterolemia  Past Surgical History Ethlyn Gallery, CMA; 08/04/2015 8:29 AM) Gallbladder Surgery - Laparoscopic Oral Surgery  Diagnostic Studies History [name], CMA; 08/04/2015 8:29 AM) Colonoscopy 1-5 years ago  Allergies [name], CMA; 08/04/2015 8:30 AM) No Known Drug Allergies06/20/2017  Medication History ([name], CMA; 08/04/2015 8:32 AM) Atorvastatin Calcium (40MG  Tablet, Oral) Active. Pantoprazole Sodium (40MG  Tablet DR, Oral) Active. Restasis (0.05% Emulsion, Ophthalmic) Active. Valsartan (80MG  Tablet, Oral) Active. Baby Aspirin (81MG  Tablet Chewable, Oral) Active. Multivitamins (Oral) Active. Nitrostat (0.4MG  Tab Sublingual, Sublingual as needed) Active. Medications Reconciled  Social History Ethlyn Gallery, CMA; 08/04/2015 8:29 AM) Alcohol use Occasional alcohol use. Caffeine use Carbonated beverages, Coffee, Tea. No drug use Tobacco use Former smoker.  Family History Ethlyn Gallery, CMA; 08/04/2015 8:29 AM) Cancer Sister. Cerebrovascular Accident Mother. Diabetes Mellitus Brother, Mother, Sister. Heart Disease Brother, Mother, Sister. Hypertension Brother, Mother, Sister. Melanoma Brother. Migraine Headache Mother, Sister. Respiratory Condition Father.    Review of Systems [name]  CMA; 08/04/2015 8:29 AM) General Present- Fatigue and Weight Loss. Not Present- Appetite Loss, Chills, Fever, Night Sweats and Weight Gain. Skin Not Present- Change in Wart/Mole, Dryness, Hives, Jaundice, New Lesions, Non-Healing Wounds, Rash and Ulcer. HEENT Present- Hoarseness and Wears glasses/contact lenses. Not Present- Earache, Hearing Loss, Nose Bleed, Oral  Ulcers, Ringing in the Ears, Seasonal Allergies, Sinus Pain, Sore Throat, Visual Disturbances and Yellow Eyes. Respiratory Not Present- Bloody sputum, Chronic Cough, Difficulty Breathing, Snoring and Wheezing. Breast Not Present- Breast Mass, Breast Pain, Nipple Discharge and Skin Changes. Cardiovascular Not Present- Chest Pain, Difficulty Breathing Lying Down, Leg Cramps, Palpitations, Rapid Heart Rate, Shortness of Breath and Swelling of Extremities. Gastrointestinal Present- Abdominal Pain, Bloating, Change in Bowel Habits, Constipation, Difficulty Swallowing, Excessive gas and Gets full quickly at meals. Not Present- Bloody Stool, Chronic diarrhea, Hemorrhoids, Indigestion, Nausea, Rectal Pain and Vomiting. Male Genitourinary Present- Impotence. Not Present- Blood in Urine, Change in Urinary Stream, Frequency, Nocturia, Painful Urination, Urgency and Urine Leakage. Musculoskeletal Present- Back Pain and Muscle Weakness. Not Present- Joint Pain, Joint Stiffness, Muscle Pain and Swelling of Extremities.  Vitals ([name] CMA; 08/04/2015 8:30 AM) 08/04/2015 8:29 AM Weight: 196 lb Height: 72in Body Surface Area: 2.11 m Body Mass Index: 26.58 kg/m  Pulse: 56 (Regular)  BP: 124/74 (Sitting, Left Arm, Standard)  BP (!) 149/64   Pulse 97   Temp 97.9 F (36.6 C) (Oral)   Resp 16   SpO2 97%       Physical Exam Danny Sportsman MD; 08/04/2015 1:41 PM) General Mental Status-Alert. General Appearance-Not in acute distress, Not Sickly. Orientation-Oriented X3. Hydration-Well  hydrated. Voice-Normal.  Integumentary Global Assessment Upon inspection and palpation of skin surfaces of the - Axillae: non-tender, no inflammation or ulceration, no drainage. and Distribution of scalp and body hair is normal. General Characteristics Temperature - normal warmth is noted.  Head and Neck Head-normocephalic, atraumatic with no lesions or palpable masses. Face Global Assessment - atraumatic, no absence of expression. Neck Global Assessment - no abnormal movements, no bruit auscultated on the right, no bruit auscultated on the left, no decreased range of motion, non-tender. Trachea-midline. Thyroid Gland Characteristics - non-tender.  Eye Eyeball - Left-Extraocular movements intact, No Nystagmus. Eyeball - Right-Extraocular movements intact, No Nystagmus. Cornea - Left-No Hazy. Cornea - Right-No Hazy. Sclera/Conjunctiva - Left-No scleral icterus, No Discharge. Sclera/Conjunctiva - Right-No scleral icterus, No Discharge. Pupil - Left-Direct reaction to light normal. Pupil - Right-Direct reaction to light normal.  ENMT Ears Pinna - Left - no drainage observed, no generalized tenderness observed. Right - no drainage observed, no generalized tenderness observed. Nose and Sinuses External Inspection of the Nose - no destructive lesion observed. Inspection of the nares - Left - quiet respiration. Right - quiet respiration. Mouth and Throat Lips - Upper Lip - no fissures observed, no pallor noted. Lower Lip - no fissures observed, no pallor noted. Nasopharynx - no discharge present. Oral Cavity/Oropharynx - Tongue - no dryness observed. Oral Mucosa - no cyanosis observed. Hypopharynx - no evidence of airway distress observed. Note: Mild hoarseness.   Chest and Lung Exam Inspection Movements - Normal and Symmetrical. Accessory muscles - No use of accessory muscles in breathing. Palpation Palpation of the chest reveals -  Non-tender. Auscultation Breath sounds - Normal and Clear.  Cardiovascular Auscultation Rhythm - Regular. Murmurs & Other Heart Sounds - Auscultation of the heart reveals - No Murmurs and No Systolic Clicks.  Abdomen Inspection Inspection of the abdomen reveals - No Visible peristalsis and No Abnormal pulsations. Umbilicus - No Bleeding, No Urine drainage. Palpation/Percussion Palpation and Percussion of the abdomen reveal - Soft, Non Tender, No Rebound tenderness, No Rigidity (guarding) and No Cutaneous hyperesthesia. Note: Abdomen soft. Nontender, nondistended. No guarding. No umbilical no other hernias   Male Genitourinary Sexual Maturity Tanner 5 -  Adult hair pattern and Adult penile size and shape. Note: No inguinal hernias. Normal external genitalia. Epididymi, testes, and spermatic cords normal without any masses.   Peripheral Vascular Upper Extremity Inspection - Left - No Cyanotic nailbeds, Not Ischemic. Right - No Cyanotic nailbeds, Not Ischemic.  Neurologic Neurologic evaluation reveals -normal attention span and ability to concentrate, able to name objects and repeat phrases. Appropriate fund of knowledge , normal sensation and normal coordination. Mental Status Affect - not angry, not paranoid. Cranial Nerves-Normal Bilaterally. Gait-Normal.  Neuropsychiatric Mental status exam performed with findings of-able to articulate well with normal speech/language, rate, volume and coherence, thought content normal with ability to perform basic computations and apply abstract reasoning and no evidence of hallucinations, delusions, obsessions or homicidal/suicidal ideation. Note: Somewhat slow in answering but mental status clear & knowledge excellent.   Musculoskeletal Global Assessment Spine, Ribs and Pelvis - no instability, subluxation or laxity. Right Upper Extremity - no instability, subluxation or laxity.  Lymphatic Head & Neck  General Head & Neck  Lymphatics: Bilateral - Description - No Localized lymphadenopathy. Axillary  General Axillary Region: Bilateral - Description - No Localized lymphadenopathy. Femoral & Inguinal  Generalized Femoral & Inguinal Lymphatics: Left - Description - No Localized lymphadenopathy. Right - Description - No Localized lymphadenopathy.    Assessment & Plan  PYLORIC STRICTURE (K31.1) Impression: Patient with worsening pyloric stricture and partial gastric outlet obstruction. Very narrowed lumen.  No longer H. pylori positive. Biopsy negative for malignancy. He does not take nonsteroidals.  He's not had improvement despite high dose acid suppression including combinational Tx.  I would check a gastrin level to rule out Zollinger-Ellison syndrome. Probably not too likely but wished to rule out atypical sources since this is not a typical course for ulcerative disease.  Would proceed with vagotomy and antrectomy. Most likely truncal versus anterior highly selective and posterior truncal to minimize recurrence. Try Billroth I anastomosis if there is mobility. Otherwise to Billroth II. Robotic to minimize larger incision.  Discussed at length with the patient, his wife, and his daughter. Questions answered. They feel comfortable proceeding. He exercises regularly and does not smoke. Hopefully his operative risks are relatively low.  I did caution that recovery can be slow and gastric ileus is common. Takes months to eventually get back to a more regular type diet. Hopefully because he's not completely obstructed nor malnourished, he will recover relatively smoothly. I have re-reviewed the the patient's records, history, medications, and allergies.  I have re-examined the patient.  I again discussed intraoperative plans and goals of post-operative recovery.  The patient agrees to proceed.    Current Plans You are being scheduled for surgery - Our schedulers will call you.  You should hear from our  office's scheduling department within 5 working days about the location, date, and time of surgery. We try to make accommodations for patient's preferences in scheduling surgery, but sometimes the OR schedule or the surgeon's schedule prevents [place] from making those accommodations.  If you have not heard from our office [phone]) in 5 working days, call the office and ask for your surgeon's nurse.  If you have other questions about your diagnosis, plan, or surgery, call the office and ask for your surgeon's nurse.  Follow Up - Call [facility] office after tests / studies done to discuss further plans Pt Education - [facility] Esophageal Surgery Diet HCI (Naol Ontiveros): discussed with patient and provided information. Pt Education - [facility] Laparoscopic Surgery HCI The anatomy & physiology of the  foregut and digestive tract was discussed. The gastric pathophysiology was discussed. Natural history risks without surgery was discussed. The patient's situation is not adequately controlled by medicines and other non-operative treatments. I feel the risks of no intervention will lead to serious problems that outweigh the operative risks; therefore, I recommended surgery to resect part of the stomach. Need for a thorough workup to rule out the differential diagnosis and plan treatment was explained. I explained laparoscopic techniques with possible need for an open approach.  Risks such as bleeding, infection, abscess, leak, need for further treatment, heart attack, death, and other risks were discussed. I noted a good likelihood this will help address the problem. Goals of post-operative recovery were discussed as well. Possibility that this will not correct all symptoms was explained. Post-operative dysphagia, need for short-term liquid & pureed diet, inability to vomit, possibility of reherniation, possible need for medicines to help control symptoms in addition to surgery were discussed. Possible need for a feeding tube was  discussed. We will work to minimize complications. Educational handouts further explaining the pathology, treatment options, and dysphagia diet was given as well. Questions were answered. The patient expresses understanding & wishes to proceed with surgery.   Danny SportsmanSteven C. Kerensa [name], M.D., [name]. Gastrointestinal and Minimally Invasive Surgery [facility] [address] [place], [place] [id number] [phone] Main / Paging

## 2015-09-25 NOTE — Anesthesia Procedure Notes (Signed)
Procedure Name: Intubation Date/Time: 09/25/2015 8:58 AM Performed by: Ludwig LeanJONES, [name]: Patient identified, Emergency Drugs available, Suction available and Patient being monitored Patient Re-evaluated:Patient Re-evaluated prior to inductionOxygen Delivery Method: Circle system utilized Preoxygenation: Pre-oxygenation with 100% oxygen Intubation Type: IV induction Ventilation: Mask ventilation without difficulty Laryngoscope Size: Glidescope and 4 Grade View: Grade I Tube type: Oral Tube size: 7.5 mm Number of attempts: 2 Airway Equipment and Method: Oral airway,  Video-laryngoscopy and Rigid stylet Placement Confirmation: ETT inserted through vocal cords under direct vision,  positive ETCO2 and breath sounds checked- equal and bilateral Secured at: 23 (at teeth) cm Tube secured with: Tape Dental Injury: Teeth and Oropharynx as per pre-operative assessment and Injury to lip  Difficulty Due To: Difficulty was anticipated, Difficult Airway- due to anterior larynx, Difficult Airway- due to limited oral opening and Difficult Airway- due to dentition Comments: DVL x 1 with mac 4, no view, DVL with glidescope grade 1 view. Small nick to uppper lip noted

## 2015-09-25 NOTE — Transfer of Care (Signed)
Immediate Anesthesia Transfer of Care Note  Patient: [name]  Procedure(s) Performed: Procedure(s): XI ROBOTIC ANTERIOR AND POSTERIOR VAGOTOMY, BILROTH I  ANASTOMOSIS DOR FUNDIPLICATION, OMENTOPEXY  UPPER ENDOSCOPY (N/A)  Patient Location: PACU  Anesthesia Type:General  Level of Consciousness: Patient easily awoken, sedated, comfortable, cooperative, following commands, responds to stimulation.   Airway & Oxygen Therapy: Patient spontaneously breathing, ventilating well, oxygen via simple oxygen mask.  Post-op Assessment: Report given to PACU RN, vital signs reviewed and stable, moving all extremities.   Post vital signs: Reviewed and stable.  Complications: No apparent anesthesia complications Last Vitals:  Vitals:   09/25/15 0711  BP: (!) 149/64  Pulse: 97  Resp: 16  Temp: 36.6 C    Last Pain:  Vitals:   09/25/15 0711  TempSrc: Oral         Complications: No apparent anesthesia complications

## 2015-09-25 NOTE — Anesthesia Postprocedure Evaluation (Signed)
Anesthesia Post Note  Patient: [name]  Procedure(s) Performed: Procedure(s) (LRB): XI ROBOTIC ANTERIOR AND POSTERIOR VAGOTOMY, BILROTH I  ANASTOMOSIS DOR FUNDIPLICATION, OMENTOPEXY  UPPER ENDOSCOPY (N/A)  Patient location during evaluation: PACU Anesthesia Type: General Level of consciousness: awake and alert Pain management: pain level controlled Vital Signs Assessment: post-procedure vital signs reviewed and stable Respiratory status: spontaneous breathing, nonlabored ventilation, respiratory function stable and patient connected to nasal cannula oxygen Cardiovascular status: blood pressure returned to baseline and stable Postop Assessment: no signs of nausea or vomiting Anesthetic complications: no    Last Vitals:  Vitals:   09/25/15 1515 09/25/15 1530  BP: (!) 174/73   Pulse: 75   Resp: 10   Temp:  (P) 36.4 C    Last Pain:  Vitals:   09/25/15 1515  TempSrc:   PainSc: 6                  Marquerite Forsman,W. EDMOND

## 2015-09-25 NOTE — Op Note (Signed)
09/25/2015  2:11 PM  PATIENT:  [name]  75 y.o. male  Patient Care Team: [name], MD as PCP - General (Internal Medicine) Karie Soda, MD as Consulting Physician (General Surgery) Willis Modena, MD as Consulting Physician (Gastroenterology)  PRE-OPERATIVE DIAGNOSIS:  Partial gastric outlet obstruction due to chronic pyloric stricture  POST-OPERATIVE DIAGNOSIS:  Partial gastric outlet obstruction due to chronic pyloric stricture  PROCEDURE:   XI ROBOTIC distal gastrectomy BILROTH I  ANASTOMOSIS  ANTERIOR AND POSTERIOR VAGOTOMY DOR (Anterior 180 degree) FUNDIPLICATION OMENTOPEXY UPPER ENDOSCOPY  SURGEON:  Surgeon(s): Karie Soda, MD Romie Levee, MD - Assist  ANESTHESIA:   local and general  EBL:  Total I/O In: 2500 [I.V.:2500] Out: 270 [Urine:170; Blood:100]  Delay start of Pharmacological VTE agent (>24hrs) due to surgical blood loss or risk of bleeding:  no  DRAINS: (19Fr ) Blake drain(s) in the RUQ over the gastroduodenal Bilroth I anastomosis   SPECIMEN:    1.  Anterior & posterior vagus nerve trunks 2.  Distal stomach (antrum) and duodenal bulb containing pyloric stricture  DISPOSITION OF SPECIMEN:  PATHOLOGY  COUNTS:  YES  PLAN OF CARE: Admit to inpatient   PATIENT DISPOSITION:  PACU - hemodynamically stable.  INDICATION:    Patient with persistent stricture in the distal stomach at the pylorus presumed to be ulcerative in nature.  No longer H. pylori positive.  Healing despite avoidance of risk factors such as nonsteroidals and use of acid suppression.  Developing stricture with partial gastric outlet obstruction.  I recommended segmental resection of the distal stomach and vagotomy to avoid recurrence.:  The anatomy & physiology of the foregut and digestive tract was discussed.  The gastric pathophysiology was discussed.  Natural history risks without surgery was discussed.   The patient's situation is not adequately controlled by medicines  and other non-operative treatments.  I feel the risks of no intervention will lead to serious problems that outweigh the operative risks; therefore, I recommended surgery to resect part of the stomach.  Need for a thorough workup to rule out the differential diagnosis and plan treatment was explained.  I explained laparoscopic techniques with possible need for an open approach.  Risks such as bleeding, infection, abscess, leak, injury to other organs, need for repair of tissues / organs, need for further treatment, heart attack, death, and other risks were discussed.   I noted a good likelihood this will help address the problem.  Goals of post-operative recovery were discussed as well.  Possibility that this will not correct all symptoms was explained.  Post-operative dysphagia, need for short-term liquid & pureed diet, inability to vomit, possibility of reherniation, possible need for medicines to help control symptoms in addition to surgery were discussed.  Possible need for a feeding tube was discussed.  We will work to minimize complications.   Educational handouts further explaining the pathology, treatment options, and dysphagia diet was given as well.  Questions were answered.  The patient expresses understanding & wishes to proceed with surgery.  OR FINDINGS:   Patient had thickened pylorus at the gastroduodenal junction consistent with stricture.  Dilated and enlarged stomach.  No retained food though.  No obvious metastatic disease on visceral parietal peritoneum or liver.  It is a handsewn gastroduodenal end-to-end (= Bilroth I) anastomosis that rests in the RUQ region.  DESCRIPTION:   Informed consent was confirmed.  The patient underwent general anaesthesia without difficulty.  The patient was positioned with arms tucked & secured appropriately.  VTE prevention  in place.  The patient's abdomen was clipped, prepped, & draped in a sterile fashion.  Surgical timeout confirmed our  plan.  The patient was positioned in reverse Trendelenburg.  Abdominal entry was gained using Veress needle technique using trach hook on the anterior abdominal wall fascia for countertension in the left upper abdomen.  Entry was clean.  I induced carbon dioxide insufflation.  Camera inspection revealed no injury.  Extra ports were carefully placed under direct laparoscopic visualization.  Placed a Nathanson liver retractor to help elevate the liver anteriorly to expose the hiatus as well as the stomach and duodenal sweep.  The liver was thickened but no fatty change.  We docked the [employer] Vinci robot carefully and placed instruments under visualization  There is no evidence of carcinomatosis or lymphadenopathy concerning for cancer.  There is some mild adhesions of greater omentum and transverse colon to the liver edge consistent with prior cholecystectomy.  These were further carefully isolated free to mobilized.  Mobilized the hepatic flexure of the colon down to expose the duodenal sweep.   I proceeded to mobilize the distal stomach and duodenal bulb resection could be done.  The stomach was not large.  I started further way up the greater curvature of the stomach and followed it distally to the duodenal bulb.  The lesser sac and freed the posterior stomach off the pancreas and retroperitoneal structures.  Is able to create a window in the lesser curve of the stomach at the junction of the body and antrum and the classic distal crow's foot about 60% down the lesser curvature of the stomach.  Then proceeded to free off more ports sterilely.  I kocherized and mobilized the duodenal sweep in a lateral to medial fashion.  Elevate the stricture at the gastroduodenal junction off the pancreatic head.  Skeletonized the duodenal bulb and first and second parts of the duodenum and mobilized them well.  I could isolate the pathology. When he went ahead and proceeded with transection.  I transected at the  duodenal bulb using a single firing of a blue load robotic stapler.  I then transected the distal stomach off the body using three loads of a green robotic stapler.  Started about two thirds down the greater curvature and came more medially to the lesser curvature of the stomach crow's foot.    I then mobilized the stomach and the lateral medial fashion.  I mobilized its attachments to the greater omentum up towards the left recurrence.  I preserved the gastroepiploic on the remaining stomach.  Came up to the short gastrics but did not take them.  Mobilized posteriorly ostomy Vas-Cath attachments as well.  With that I got good mobilization stomach to reach towards the duodenal bulb.  Therefore I found that would be able to proceed with a gastroduodenal end and Bilroth one anastomosis.  Proceeded with truncal vagotomy.  I placed the stomach on axial tension.  I freed off soft go gastrophrenic attachments to the right and left current and got into the anterior mediastinum.  Mobilized the distal esophagus to identify and isolate the anterior-posterior vagus nerves.  Mobilized the anterior vagus nerve off the anterior esophagus.  I skeletonized it about 6 cm up into the mediastinum and transected.  I skeletonized it just distal to cardia and transected at their.  Sent it off to pathology.  I isolated the posterior vagus nerve and skeletonized and transected in a similar fashion.  I reapproximated the hiatal defect needed for the dissection  using #1 Ethibond horizontal mattress sutures 2.  I proceeded with a Dor anterior fundoplication taking the body of the stomach to cover up the left anterior crus anterior venous Tiedemann all all along the right crus to help roll the stomach anteriorly to help protect the closure and provides some mild antireflux procedure to compensate for the scapholunate gastric dissection.  So help pexy the stomach to stay more medially and not fall away and create tension at the  anastomosis.  I also pexied the lesser curvature of the stomach to the retroperitoneal fashion near the caudate lobe to help pexy the stomach and keep it falling away.  I set up the gastroduodenal anastomosis with some 2-0 silks at the corners about a centimeter away from the openings.  I dissected the staple line at the duodenal bulb as well as the staple line towards the lesser curvature of the stomach.  Had nice 4cm patent openings.   We attempted to pass a nasogastric tube it would not pass easily. The patient had a very difficult airway for the endotracheal tube.  Therefore required an EGD scope to pass down the hypopharynx and help push the nasogastric tube into the stomach.  I did not encounter any obvious mass in the oropharynx or hypopharynx or any major stricturing.  Esophagus was not dilated.  Had a snug esophagogastric junction at 44 cm from the incisors consistent with a hiatal closure and partial fundoplication, but there was no stricturing or resistance there.  The stomach was viable without any evidence of ulceration or ischemia or tumor elsewhere.  I had to take down the silk stitches and rolled the stomach to to be able to directly use robotic instruments to help grab the nasogastric tube and pull it out the gastrotomy opening.  We held the nasogastric troop while we removed the EGD.  I then proceeded with Billroth I type gastroduodenal and an anastomosis.  Used 2-0 V-lock suture to run a gastroduodenal closure on the posterior circumference.  We then positioned nasogastric tube into the second and third part of the duodenum.  I then V-lock interrupted running sutures to help close the anterior part of the anastomosis in a running Connell fashion [place] each corner and meeting in the center with the nasogastric tube through the anastomosis keeping a nice and patent..  I did meticulous inspection with no evidence leak or ischemia.  The stomach was insufflated with oxygen with the duodenal sweep  clamped under fluid.  There is no leak of air, consistent with an airtight closure.  Place the distal stomach and duodenal bulb in an Endo Catch bag.  Placed a drain.  Remove the liver retractor under direct visualization.  We evacuated carbon dioxide.  Remove the distal stomach and duodenal bulb out of the 12 m staple site after enlarging it to 5cm.  I closed the extraction wound using #1 PDS transverse anterior rectal fascial closure like a small Pfannenstiel closure. I closed the skin with some interrupted Monocryl stitches.  I placed sterile dressings.     Patient is being extubated go to recovery room. I discussed postop care with the patient in detail the office & in the holding area. Instructions are written.  I updated the patient's status to the family.  Recommendations were made.  Questions were answered.  The family expressed understanding & appreciation.  Ardeth Sportsman, M.D., F.A.C.S. Gastrointestinal and Minimally Invasive Surgery [facility] [address], [address] [place], [place] [address] [phone]  Main / Paging

## 2015-09-26 LAB — BASIC METABOLIC PANEL
Anion gap: 10 (ref 5–15)
BUN: 23 mg/dL — ABNORMAL HIGH (ref 6–20)
CALCIUM: 8.5 mg/dL — AB (ref 8.9–10.3)
CO2: 22 mmol/L (ref 22–32)
Chloride: 105 mmol/L (ref 101–111)
Creatinine, Ser: 0.83 mg/dL (ref 0.61–1.24)
GLUCOSE: 144 mg/dL — AB (ref 65–99)
Potassium: 4.6 mmol/L (ref 3.5–5.1)
Sodium: 137 mmol/L (ref 135–145)

## 2015-09-26 LAB — CBC
HEMATOCRIT: 39.2 % (ref 39.0–52.0)
HEMOGLOBIN: 13.4 g/dL (ref 13.0–17.0)
MCH: 30.9 pg (ref 26.0–34.0)
MCHC: 34.2 g/dL (ref 30.0–36.0)
MCV: 90.5 fL (ref 78.0–100.0)
Platelets: 225 10*3/uL (ref 150–400)
RBC: 4.33 MIL/uL (ref 4.22–5.81)
RDW: 12.6 % (ref 11.5–15.5)
WBC: 16.9 10*3/uL — AB (ref 4.0–10.5)

## 2015-09-26 MED ORDER — LORAZEPAM 2 MG/ML IJ SOLN
0.5000 mg | Freq: Two times a day (BID) | INTRAMUSCULAR | Status: DC | PRN
  Administered 2015-09-26 – 2015-09-28 (×3): 0.5 mg via INTRAVENOUS
  Filled 2015-09-26 (×2): qty 1

## 2015-09-26 MED ORDER — ENOXAPARIN SODIUM 40 MG/0.4ML ~~LOC~~ SOLN
40.0000 mg | SUBCUTANEOUS | Status: DC
  Administered 2015-09-27 – 2015-10-01 (×5): 40 mg via SUBCUTANEOUS
  Filled 2015-09-26 (×5): qty 0.4

## 2015-09-26 MED ORDER — PHENOL 1.4 % MT LIQD
1.0000 | OROMUCOSAL | Status: DC | PRN
  Filled 2015-09-26: qty 177

## 2015-09-26 NOTE — Progress Notes (Signed)
Dr. Ezzard StandingNewman aware via phone pt experiencing anxiety stating "I feel like I'm having a panic attack". Wife and daughter at bedside. Pt reassured. New order received for IV Ativan.

## 2015-09-26 NOTE — Progress Notes (Signed)
1 Day Post-Op  Subjective: Had some bleeding around drain insertion site early this morning with activity which stopped with pressure and a pressure bandage.  Has a sore throat.  Objective: Vital signs in last 24 hours: Temp:  [97.4 F (36.3 C)-98.4 F (36.9 C)] 97.9 F (36.6 C) (08/12 0534) Pulse Rate:  [60-81] 68 (08/12 0534) Resp:  [10-20] 20 (08/12 0534) BP: (144-179)/(50-77) 144/58 (08/12 0534) SpO2:  [96 %-100 %] 96 % (08/12 0534) Last BM Date: 09/24/15  Intake/Output from previous day: 08/11 0701 - 08/12 0700 In: 3470 [P.O.:70; I.V.:3100; IV Piggyback:300] Out: 1730 [Urine:1415; Emesis/NG output:55; Drains:160; Blood:100] Intake/Output this shift: No intake/output data recorded.  PE: General- In NAD Abdomen-soft, all dressings dry including bulky drain dressing, some thin bloody drain output, few bowel sounds.  Lab Results:   Recent Labs  09/26/15 0711  WBC 16.9*  HGB 13.4  HCT 39.2  PLT 225   BMET  Recent Labs  09/26/15 0711  NA 137  K 4.6  CL 105  CO2 22  GLUCOSE 144*  BUN 23*  CREATININE 0.83  CALCIUM 8.5*   PT/INR No results for input(s): LABPROT, INR in the last 72 hours. Comprehensive Metabolic Panel:    Component Value Date/Time   NA 137 09/26/2015 0711   NA 138 09/02/2015 1030   K 4.6 09/26/2015 0711   K 4.1 09/02/2015 1030   CL 105 09/26/2015 0711   CL 105 09/02/2015 1030   CO2 22 09/26/2015 0711   CO2 26 09/02/2015 1030   BUN 23 (H) 09/26/2015 0711   BUN 19 09/02/2015 1030   CREATININE 0.83 09/26/2015 0711   CREATININE 1.07 09/02/2015 1030   GLUCOSE 144 (H) 09/26/2015 0711   GLUCOSE 79 09/02/2015 1030   CALCIUM 8.5 (L) 09/26/2015 0711   CALCIUM 9.3 09/02/2015 1030     Studies/Results: No results found.  Anti-infectives: Anti-infectives    Start     Dose/Rate Route Frequency Ordered Stop   09/25/15 1800  ceFAZolin (ANCEF) IVPB 2g/100 mL premix     2 g 200 mL/hr over 30 Minutes Intravenous Every 8 hours 09/25/15 1608  09/26/15 0654   09/25/15 1800  metroNIDAZOLE (FLAGYL) IVPB 500 mg     500 mg 100 mL/hr over 60 Minutes Intravenous Every 6 hours 09/25/15 1608 09/26/15 0610   09/25/15 0709  ceFAZolin (ANCEF) IVPB 2g/100 mL premix     2 g 200 mL/hr over 30 Minutes Intravenous 60 min pre-op 09/25/15 0709 09/25/15 0901   09/25/15 0709  ceFAZolin (ANCEF) IVPB 2g/100 mL premix  Status:  Discontinued     2 g 200 mL/hr over 30 Minutes Intravenous On call to O.R. 09/25/15 [id number] 09/25/15 0711   09/25/15 0709  metroNIDAZOLE (FLAGYL) IVPB 500 mg     500 mg 100 mL/hr over 60 Minutes Intravenous On call to O.R. 09/25/15 [id number] 09/25/15 0916      Assessment 1.  Partial gastric outlet obstruction due to chronic pyloric stricture s/p Robotic assisted vagotomy and antrectomy with B1 reconstruction 09/26/15- some bleeding around drain site but no significant drop in hemoglobin.  2.  HTN-BP okay this AM; on hydralazine prn.    LOS: 1 day   Plan: Hold Lovenox today.  Get OOB later today. NG clamping trial today.   Brylin Stopper J 09/26/2015

## 2015-09-27 NOTE — Progress Notes (Signed)
2 Days Post-Op  Subjective: Trouble sleeping last night and had some anxiety.  Passing gas.  Objective: Vital signs in last 24 hours: Temp:  [97.7 F (36.5 C)-98.6 F (37 C)] 97.9 F (36.6 C) (08/13 0544) Pulse Rate:  [57-67] 67 (08/13 0544) Resp:  [16-18] 16 (08/13 0544) BP: (110-167)/(63-98) 110/90 (08/13 0544) SpO2:  [91 %-98 %] 91 % (08/13 0544) Last BM Date:  (pta)  Intake/Output from previous day: 08/12 0701 - 08/13 0700 In: 1188.3 [I.V.:1188.3] Out: 1805 [Urine:1625; Drains:180] Intake/Output this shift: No intake/output data recorded.  PE: General- In NAD Abdomen-soft, all dressings dry including bulky drain dressing, drain output more serous now  Lab Results:   Recent Labs  09/26/15 0711  WBC 16.9*  HGB 13.4  HCT 39.2  PLT 225   BMET  Recent Labs  09/26/15 0711  NA 137  K 4.6  CL 105  CO2 22  GLUCOSE 144*  BUN 23*  CREATININE 0.83  CALCIUM 8.5*   PT/INR No results for input(s): LABPROT, INR in the last 72 hours. Comprehensive Metabolic Panel:    Component Value Date/Time   NA 137 09/26/2015 0711   NA 138 09/02/2015 1030   K 4.6 09/26/2015 0711   K 4.1 09/02/2015 1030   CL 105 09/26/2015 0711   CL 105 09/02/2015 1030   CO2 22 09/26/2015 0711   CO2 26 09/02/2015 1030   BUN 23 (H) 09/26/2015 0711   BUN 19 09/02/2015 1030   CREATININE 0.83 09/26/2015 0711   CREATININE 1.07 09/02/2015 1030   GLUCOSE 144 (H) 09/26/2015 0711   GLUCOSE 79 09/02/2015 1030   CALCIUM 8.5 (L) 09/26/2015 0711   CALCIUM 9.3 09/02/2015 1030     Studies/Results: No results found.  Anti-infectives: Anti-infectives    Start     Dose/Rate Route Frequency Ordered Stop   09/25/15 1800  ceFAZolin (ANCEF) IVPB 2g/100 mL premix     2 g 200 mL/hr over 30 Minutes Intravenous Every 8 hours 09/25/15 1608 09/26/15 0654   09/25/15 1800  metroNIDAZOLE (FLAGYL) IVPB 500 mg     500 mg 100 mL/hr over 60 Minutes Intravenous Every 6 hours 09/25/15 1608 09/26/15 0610   09/25/15 0709  ceFAZolin (ANCEF) IVPB 2g/100 mL premix     2 g 200 mL/hr over 30 Minutes Intravenous 60 min pre-op 09/25/15 0709 09/25/15 0901   09/25/15 0709  ceFAZolin (ANCEF) IVPB 2g/100 mL premix  Status:  Discontinued     2 g 200 mL/hr over 30 Minutes Intravenous On call to O.R. 09/25/15 [id number] 09/25/15 0711   09/25/15 0709  metroNIDAZOLE (FLAGYL) IVPB 500 mg     500 mg 100 mL/hr over 60 Minutes Intravenous On call to O.R. 09/25/15 [id number] 09/25/15 0916      Assessment 1.  Partial gastric outlet obstruction due to chronic pyloric stricture s/p Robotic assisted vagotomy and antrectomy with B1 reconstruction 09/26/15- no further bleeding around drain site; bowel function starting to return 2.  HTN-BP wnl.    LOS: 2 days   Plan: Remove ng tube.  Start liquid diet.  Restart Lovenox.     Robbyn Hodkinson J 09/27/2015

## 2015-09-28 ENCOUNTER — Encounter (HOSPITAL_COMMUNITY): Payer: Self-pay | Admitting: Surgery

## 2015-09-28 DIAGNOSIS — E785 Hyperlipidemia, unspecified: Secondary | ICD-10-CM | POA: Diagnosis present

## 2015-09-28 DIAGNOSIS — F411 Generalized anxiety disorder: Secondary | ICD-10-CM

## 2015-09-28 MED ORDER — SODIUM CHLORIDE 0.9% FLUSH
3.0000 mL | Freq: Two times a day (BID) | INTRAVENOUS | Status: DC
  Administered 2015-09-28 – 2015-09-30 (×6): 3 mL via INTRAVENOUS

## 2015-09-28 MED ORDER — IRBESARTAN 75 MG PO TABS
37.5000 mg | ORAL_TABLET | Freq: Every day | ORAL | Status: DC
  Administered 2015-09-28 – 2015-09-29 (×2): 37.5 mg via ORAL
  Filled 2015-09-28: qty 0.5

## 2015-09-28 MED ORDER — TRAMADOL HCL 50 MG PO TABS
50.0000 mg | ORAL_TABLET | Freq: Four times a day (QID) | ORAL | Status: DC | PRN
  Administered 2015-09-28 (×2): 100 mg via ORAL
  Administered 2015-09-29 (×2): 50 mg via ORAL
  Administered 2015-09-29: 100 mg via ORAL
  Administered 2015-09-30: 50 mg via ORAL
  Filled 2015-09-28: qty 2
  Filled 2015-09-28 (×2): qty 1
  Filled 2015-09-28: qty 2
  Filled 2015-09-28: qty 1
  Filled 2015-09-28: qty 2

## 2015-09-28 MED ORDER — LORAZEPAM 2 MG/ML IJ SOLN
0.5000 mg | Freq: Three times a day (TID) | INTRAMUSCULAR | Status: DC | PRN
Start: 2015-09-28 — End: 2015-09-29
  Administered 2015-09-29: 0.5 mg via INTRAVENOUS
  Filled 2015-09-28: qty 1

## 2015-09-28 MED ORDER — SODIUM CHLORIDE 0.9 % IV SOLN: 250.0000 mL | INTRAVENOUS | Status: DC | PRN

## 2015-09-28 MED ORDER — ADULT MULTIVITAMIN W/MINERALS CH
1.0000 | ORAL_TABLET | Freq: Every day | ORAL | Status: DC
  Administered 2015-09-28 – 2015-09-30 (×3): 1 via ORAL
  Filled 2015-09-28 (×3): qty 1

## 2015-09-28 MED ORDER — PANTOPRAZOLE SODIUM 40 MG PO TBEC
40.0000 mg | DELAYED_RELEASE_TABLET | Freq: Every day | ORAL | Status: DC
  Administered 2015-09-28 – 2015-10-01 (×4): 40 mg via ORAL
  Filled 2015-09-28 (×4): qty 1

## 2015-09-28 MED ORDER — LACTATED RINGERS IV BOLUS (SEPSIS): 1000.0000 mL | Freq: Three times a day (TID) | INTRAVENOUS | Status: AC | PRN

## 2015-09-28 MED ORDER — DOCUSATE SODIUM 100 MG PO CAPS: 100.0000 mg | ORAL_CAPSULE | Freq: Two times a day (BID) | ORAL | Status: DC | PRN

## 2015-09-28 MED ORDER — ATORVASTATIN CALCIUM 20 MG PO TABS
40.0000 mg | ORAL_TABLET | Freq: Every day | ORAL | Status: DC
  Administered 2015-09-28 – 2015-09-30 (×3): 40 mg via ORAL
  Filled 2015-09-28 (×4): qty 2

## 2015-09-28 MED ORDER — TAB-A-VITE/IRON PO TABS
1.0000 | ORAL_TABLET | Freq: Every day | ORAL | Status: DC
  Filled 2015-09-28: qty 1

## 2015-09-28 MED ORDER — ACETAMINOPHEN 325 MG PO TABS: 325.0000 mg | ORAL_TABLET | Freq: Four times a day (QID) | ORAL | Status: DC | PRN

## 2015-09-28 MED ORDER — ASPIRIN EC 81 MG PO TBEC
81.0000 mg | DELAYED_RELEASE_TABLET | Freq: Every evening | ORAL | Status: DC
  Administered 2015-09-28 – 2015-09-30 (×3): 81 mg via ORAL
  Filled 2015-09-28 (×3): qty 1

## 2015-09-28 MED ORDER — SODIUM CHLORIDE 0.9% FLUSH: 3.0000 mL | INTRAVENOUS | Status: DC | PRN

## 2015-09-28 NOTE — Progress Notes (Addendum)
[facility]  [address]  [place], [place] [address] Phone: [phone] FAX: [phone]   [name] [id number] September 27, 1940  CARE TEAM:  PCP: Danny MountainGRIFFIN,JOHN JOSEPH, MD  Outpatient Care Team: Patient Care Team: Danny FunkJohn Griffin, MD as PCP - General (Internal Medicine) Danny SodaSteven Ruthanna Macchia, MD as Consulting Physician (General Surgery) Danny ModenaWilliam Outlaw, MD as Consulting Physician (Gastroenterology)  Inpatient Treatment Team: Treatment Team: Attending Provider: Karie SodaSteven Slevin Gunby, MD; Registered Nurse: Danny HayLinda D Widener, RN; Technician: [name], NT; Technician: [name], NT  Problem List:   Principal Problem:   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 09/25/2015 Active Problems:   Essential hypertension, benign   Dystrophy, Salzmann's nodular   Hyperlipidemia   Anxiety state   3 Days Post-Op  09/25/2015  POST-OPERATIVE DIAGNOSIS:  Partial gastric outlet obstruction due to chronic pyloric stricture  PROCEDURE:   XI ROBOTIC distal gastrectomy BILROTH I  ANASTOMOSIS  ANTERIOR AND POSTERIOR VAGOTOMY DOR (Anterior 180 degree) FUNDOPLICATION OMENTOPEXY UPPER ENDOSCOPY  SURGEON:  Danny SodaSteven Vivianna Piccini, MD    Assessment  Recovering  Plan:  -adv to soft diet - dec to liquids if not tolerating -nutrition consult to help w post-gastrectomy diet (sep liquids from solids, smaller more frequent meals, etc) -medlock IVF -switch to PO pain control -HTN control - start w 1/2 usual dose -anxiolysis -VTE prophylaxis- SCDs, etc -mobilize as tolerated to help recovery.  PT/OT evals  I updated the patient's status to the patient, nurse, and family.  D/w Dr Dulce Sellarutlaw with GI.  Recommendations were made.  Questions were answered.  They expressed understanding & appreciation.   [name], M.D., F.A.C.S. Gastrointestinal and Minimally Invasive Surgery [facility] [address] [place], [place] [address] [phone] Main / Paging   09/28/2015  Subjective:  Tol fulls No nausea Minimal flatus Walked down hallway Daughter at bedside RN Gunnar Fusiaula outside room  Objective:  Vital signs:  Vitals:   09/27/15 0544 09/27/15 1352 09/27/15 2233 09/28/15 0449  BP: 110/90 (!) 159/61 (!) 170/59 (!) 157/79  Pulse: 67 74 79 74  Resp: 16 16 16 18   Temp: 97.9 F (36.6 C) 99.7 F (37.6 C) 98.3 F (36.8 C) 97.9 F (36.6 C)  TempSrc: Oral Oral Oral Oral  SpO2: 91% 94% 91% 93%  Weight:      Height:        Last BM Date:  (pta)  Intake/Output   Yesterday:  08/13 0701 - 08/14 0700 In: 1800 [P.O.:600; I.V.:1200] Out: 1565 [Urine:1450; Drains:115] This shift:  No intake/output data recorded.  Bowel function:  Flatus: No  BM:  No  Drain: Serosanguinous   Physical Exam:  General: Pt awake/alert/oriented x4 in No acute distress Eyes: PERRL, normal EOM.  Sclera clear.  No icterus Neuro: CN II-XII intact w/o focal sensory/motor deficits. Lymph: No head/neck/groin lymphadenopathy Psych:  No delerium/psychosis/paranoia HENT: Normocephalic, Mucus membranes moist.  No thrush Neck: Supple, No tracheal deviation Chest: No chest wall pain w good excursion CV:  Pulses intact.  Regular rhythm MS: Normal AROM mjr joints.  No obvious deformity Abdomen: Soft.  Mildy distended.  Mildly tender at incisions only.  No evidence of peritonitis.  No incarcerated hernias. Ext:  SCDs BLE.  No mjr edema.  No cyanosis Skin: No petechiae / purpura  Results:   Labs: No results found for this or any previous visit (from the past 48 hour(s)).  Imaging / Studies: No results found.  Medications / Allergies:  per chart  Antibiotics: Anti-infectives    Start     Dose/Rate Route Frequency Ordered Stop   09/25/15 1800  ceFAZolin (ANCEF) IVPB 2g/100 mL premix     2 g 200 mL/hr over 30 Minutes Intravenous Every 8 hours 09/25/15 1608 09/26/15 0654   09/25/15 1800   metroNIDAZOLE (FLAGYL) IVPB 500 mg     500 mg 100 mL/hr over 60 Minutes Intravenous Every 6 hours 09/25/15 1608 09/26/15 0610   09/25/15 0709  ceFAZolin (ANCEF) IVPB 2g/100 mL premix     2 g 200 mL/hr over 30 Minutes Intravenous 60 min pre-op 09/25/15 0709 09/25/15 0901   09/25/15 0709  ceFAZolin (ANCEF) IVPB 2g/100 mL premix  Status:  Discontinued     2 g 200 mL/hr over 30 Minutes Intravenous On call to O.R. 09/25/15 [id number] 09/25/15 0711   09/25/15 0709  metroNIDAZOLE (FLAGYL) IVPB 500 mg     500 mg 100 mL/hr over 60 Minutes Intravenous On call to O.R. 09/25/15 0709 09/25/15 0916        Note: Portions of this report may have been transcribed using voice recognition software. Every effort was made to ensure accuracy; however, inadvertent computerized transcription errors may be present.   Any transcriptional errors that result from this process are unintentional.     [name], M.D., F.A.C.S. Gastrointestinal and Minimally Invasive Surgery [facility] [address] [place], [place] [address] [phone] Main / Paging   09/28/2015

## 2015-09-28 NOTE — Evaluation (Addendum)
Physical Therapy Evaluation Patient Details Name: Danny GalRalph T Parcell MRN: [id number] DOB: 12/18/1940 Today's Date: 09/28/2015   History of Present Illness  75 y.o. male admitted with gastric outlet obstruction, s/p partial gastrectomy.  Clinical Impression  Pt admitted with above diagnosis. Pt currently with functional limitations due to the deficits listed below (see PT Problem List). Pt had loss of balance initially upon standing, nursing reports this has occurred with them as well when assisting pt. Pt was steady walking with a RW. At present, close supervision recommended for mobility. Pt ambulated 100' with RW, distance limited by pain. Instructed pt in abdominal bracing with pillow.  Pt will benefit from skilled PT to increase their independence and safety with mobility to allow discharge to the venue listed below.       Follow Up Recommendations Home health PT    Equipment Recommendations  Rolling walker with 5" wheels    Recommendations for Other Services       Precautions / Restrictions Precautions Precautions: None;Other (comment) Precaution Comments: pt/wife deny h/o falls in past year; abdominal surgery Restrictions Weight Bearing Restrictions: No      Mobility  Bed Mobility               General bed mobility comments: NT- up in recliner  Transfers Overall transfer level: Needs assistance   Transfers: Sit to/from Stand Sit to Stand: Min assist         General transfer comment: R lateral LOB initially upon standing, required min A to correct  Ambulation/Gait Ambulation/Gait assistance: Min guard Ambulation Distance (Feet): 100 Feet Assistive device: Rolling walker (2 wheeled) Gait Pattern/deviations: Step-through pattern   Gait velocity interpretation: Below normal speed for age/gender General Gait Details: steady with RW, distance limited by pain  Stairs            Wheelchair Mobility    Modified Rankin (Stroke Patients Only)        Balance Overall balance assessment: Needs assistance   Sitting balance-Leahy Scale: Good       Standing balance-Leahy Scale: Poor Standing balance comment: LOB laterally initially upon standing (RN reports this has occurred when she's gotten pt up as well)                             Pertinent Vitals/Pain Pain Assessment: 0-10 Pain Score: 5  Pain Location: abdomen with walking Pain Descriptors / Indicators: Sore Pain Intervention(s): Monitored during session;Limited activity within patient's tolerance;Premedicated before session    Home Living Family/patient expects to be discharged to:: Private residence Living Arrangements: Spouse/significant other Available Help at Discharge: Family;Available 24 hours/day Type of Home: House Home Access: Stairs to enter Entrance Stairs-Rails: LawyerLeft;Right Entrance Stairs-Number of Steps: 3 Home Layout: One level Home Equipment: None      Prior Function Level of Independence: Independent               Hand Dominance        Extremity/Trunk Assessment   Upper Extremity Assessment: Overall WFL for tasks assessed           Lower Extremity Assessment: Overall WFL for tasks assessed      Cervical / Trunk Assessment: Normal  Communication   Communication: Expressive difficulties (speech somewhat slurred, pt stated he's seen a neurologist about this and was told it was due to GERD)  Cognition Arousal/Alertness: Awake/alert Behavior During Therapy: WFL for tasks assessed/performed Overall Cognitive Status: Within Functional Limits for tasks assessed  General Comments      Exercises        Assessment/Plan    PT Assessment Patient needs continued PT services  PT Diagnosis Difficulty walking;Acute pain   PT Problem List Decreased activity tolerance;Decreased balance;Decreased knowledge of use of DME;Decreased mobility;Pain  PT Treatment Interventions DME instruction;Gait  training;Stair training;Functional mobility training;Balance training;Therapeutic exercise;Therapeutic activities;Patient/family education   PT Goals (Current goals can be found in the Care Plan section) Acute Rehab PT Goals Patient Stated Goal: return [place] exercise class and [place] [facility] PT Goal Formulation: With patient/family Time For Goal Achievement: 10/12/15 Potential to Achieve Goals: Good    Frequency Min 3X/week   Barriers to discharge        Co-evaluation               End of Session Equipment Utilized During Treatment: Gait belt Activity Tolerance: Patient tolerated treatment well Patient left: in chair;with call bell/phone within reach;with chair alarm set;with family/visitor present Nurse Communication: Mobility status         Time: [id number] PT Time Calculation (min) (ACUTE ONLY): 27 min   Charges:   PT Evaluation $PT Eval Low Complexity: 1 Procedure PT Treatments $Gait Training: 8-22 mins   PT G Codes:        Tamala SerUhlenberg, Norman Piacentini Kistler 09/28/2015, 2:29 PM [phone]

## 2015-09-28 NOTE — Progress Notes (Signed)
Nutrition Brief Note  Visited patient to complete diet education for partial gastrectomy. Patient's wife requested education to be completed prior to discharge. RD will monitor and complete when appropriate.  Dionne AnoWilliam M. Kai Calico, MS, RD LDN Inpatient Clinical Dietitian Pager [phone]

## 2015-09-29 LAB — CBC
HCT: 35.3 % — ABNORMAL LOW (ref 39.0–52.0)
HEMOGLOBIN: 12.3 g/dL — AB (ref 13.0–17.0)
MCH: 31.1 pg (ref 26.0–34.0)
MCHC: 34.8 g/dL (ref 30.0–36.0)
MCV: 89.4 fL (ref 78.0–100.0)
PLATELETS: 238 10*3/uL (ref 150–400)
RBC: 3.95 MIL/uL — AB (ref 4.22–5.81)
RDW: 12.4 % (ref 11.5–15.5)
WBC: 10.5 10*3/uL (ref 4.0–10.5)

## 2015-09-29 LAB — BASIC METABOLIC PANEL
ANION GAP: 7 (ref 5–15)
BUN: 15 mg/dL (ref 6–20)
CALCIUM: 8.3 mg/dL — AB (ref 8.9–10.3)
CO2: 27 mmol/L (ref 22–32)
Chloride: 98 mmol/L — ABNORMAL LOW (ref 101–111)
Creatinine, Ser: 0.81 mg/dL (ref 0.61–1.24)
Glucose, Bld: 100 mg/dL — ABNORMAL HIGH (ref 65–99)
POTASSIUM: 3.5 mmol/L (ref 3.5–5.1)
SODIUM: 132 mmol/L — AB (ref 135–145)

## 2015-09-29 LAB — MAGNESIUM: MAGNESIUM: 2 mg/dL (ref 1.7–2.4)

## 2015-09-29 MED ORDER — IRBESARTAN 75 MG PO TABS
75.0000 mg | ORAL_TABLET | Freq: Every day | ORAL | Status: DC
  Administered 2015-09-30: 75 mg via ORAL
  Filled 2015-09-29: qty 1

## 2015-09-29 MED ORDER — FENTANYL CITRATE (PF) 100 MCG/2ML IJ SOLN: 25.0000 ug | INTRAMUSCULAR | Status: DC | PRN

## 2015-09-29 MED ORDER — BOOST PLUS PO LIQD
237.0000 mL | Freq: Three times a day (TID) | ORAL | Status: DC
  Administered 2015-09-29 – 2015-10-01 (×4): 237 mL via ORAL
  Filled 2015-09-29 (×7): qty 237

## 2015-09-29 MED ORDER — SIMETHICONE 80 MG PO CHEW
40.0000 mg | CHEWABLE_TABLET | Freq: Four times a day (QID) | ORAL | Status: AC
  Administered 2015-09-29 – 2015-09-30 (×6): 40 mg via ORAL
  Filled 2015-09-29 (×6): qty 1

## 2015-09-29 NOTE — Care Management Important Message (Signed)
Important Message  Patient Details  Name: Danny GalRalph T Markus MRN: [id number] Date of Birth: 12-Sep-1940   Medicare Important Message Given:  Yes    Haskell FlirtJamison, Lusero Nordlund 09/29/2015, 11:57 AMImportant Message  Patient Details  Name: Danny GalRalph T Hippler MRN: [id number] Date of Birth: 12-Sep-1940   Medicare Important Message Given:  Yes    Haskell FlirtJamison, Dayn Barich 09/29/2015, 11:57 AM

## 2015-09-29 NOTE — Progress Notes (Signed)
[facility]  [address]  [place], [place] [id number] Phone: [phone] FAX: [phone]   Ardyth GalRalph T Edelman [id number] [birth year]  CARE TEAM:  PCP: Lillia MountainGRIFFIN,JOHN JOSEPH, MD  Outpatient Care Team: Patient Care Team: Kirby FunkJohn Griffin, MD as PCP - General (Internal Medicine) Karie SodaSteven Sharilynn Cassity, MD as Consulting Physician (General Surgery) Willis ModenaWilliam Outlaw, MD as Consulting Physician (Gastroenterology)  Inpatient Treatment Team: Treatment Team: Attending Provider: Karie SodaSteven Karisa Nesser, MD; Registered Nurse: Phillips HayLinda D Widener, RN; Technician: Manon HildingAnnette A Mink, NT; Technician: Dorita SciaraKathryn J Hege, NT; Technician: Almyra BraceMcKenzie A Hunter Spaugh, NT; Occupational Therapist: Marica OtterMaryellen Spencer, OT; Technician: Luther Bradleyestiny M Caudle, NT  Problem List:   Principal Problem:   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 09/25/2015 Active Problems:   Essential hypertension, benign   Dystrophy, Salzmann's nodular   Hyperlipidemia   Anxiety state   4 Days Post-Op  09/25/2015  POST-OPERATIVE DIAGNOSIS:  Partial gastric outlet obstruction due to chronic pyloric stricture  PROCEDURE:   XI ROBOTIC distal gastrectomy BILROTH I  ANASTOMOSIS  ANTERIOR AND POSTERIOR VAGOTOMY DOR (Anterior 180 degree) FUNDOPLICATION OMENTOPEXY UPPER ENDOSCOPY  SURGEON:  Karie SodaSteven Hayes Czaja, MD    Assessment  OK  Plan:  -Dys 1 diet until gastric ileus resolved -nutrition consult to help w post-gastrectomy diet (sep liquids from solids, smaller more frequent meals, etc) -SLP eval r/o dysphagia issues -medlock IVF.  IVF PRN -d/c drain -switch to PO pain control -HTN control - inc to full AngioInh dose -anxiolysis - too groggy - stop ativan & follow -VTE prophylaxis- SCDs, etc -mobilize as tolerated to help recovery.  PT/OT evals  I updated the patient's status to the patient, nurse, and family.  D/w Dr Dulce Sellarutlaw with GI.  Recommendations were made.  Questions were answered.  They  expressed understanding & appreciation.   Ardeth SportsmanSteven C. Rosenda Geffrard, M.D., F.A.C.S. Gastrointestinal and Minimally Invasive Surgery [facility] [address] [place], [place] [address] [phone] Main / Paging   09/29/2015  Subjective:  More full Appetite OK Anxious - likes ativan but family worried he is too groggy No nausea Walked down hallway Family at bedside RN outside room  Objective:  Vital signs:  Vitals:   09/28/15 0449 09/28/15 1428 09/28/15 2131 09/29/15 0555  BP: (!) 157/79 (!) 168/61 (!) 160/57 (!) 159/61  Pulse: 74 67 66 68  Resp: 18 16 16 16   Temp: 97.9 F (36.6 C) 98.1 F (36.7 C) 98.2 F (36.8 C) 98.2 F (36.8 C)  TempSrc: Oral Oral Oral Oral  SpO2: 93% 94% 95% 96%  Weight:      Height:        Last BM Date:  (pta)  Intake/Output   Yesterday:  08/14 0701 - 08/15 0700 In: 360 [P.O.:360] Out: 965 [Urine:825; Drains:140] This shift:  No intake/output data recorded.  Bowel function:  Flatus: No  BM:  No  Drain: Serosanguinous   Physical Exam:  General: Pt awake/alert/oriented x4 in No acute distress Eyes: PERRL, normal EOM.  Sclera clear.  No icterus Neuro: CN II-XII intact w/o focal sensory/motor deficits. Lymph: No head/neck/groin lymphadenopathy Psych:  No delerium/psychosis/paranoia HENT: Normocephalic, Mucus membranes moist.  No thrush Neck: Supple, No tracheal deviation Chest: No chest wall pain w good excursion CV:  Pulses intact.  Regular rhythm MS: Normal AROM mjr joints.  No obvious deformity Abdomen: Soft.  Moderately distended.  Mildly tender at incisions only.  No evidence of peritonitis.  No incarcerated hernias. Ext:  SCDs BLE.  No  mjr edema.  No cyanosis Skin: No petechiae / purpura  Results:   Labs: No results found for this or any previous visit (from the past 48 hour(s)).  Imaging / Studies: No results found.  Medications / Allergies: per chart  Antibiotics: Anti-infectives     Start     Dose/Rate Route Frequency Ordered Stop   09/25/15 1800  ceFAZolin (ANCEF) IVPB 2g/100 mL premix     2 g 200 mL/hr over 30 Minutes Intravenous Every 8 hours 09/25/15 1608 09/26/15 0654   09/25/15 1800  metroNIDAZOLE (FLAGYL) IVPB 500 mg     500 mg 100 mL/hr over 60 Minutes Intravenous Every 6 hours 09/25/15 1608 09/26/15 0610   09/25/15 0709  ceFAZolin (ANCEF) IVPB 2g/100 mL premix     2 g 200 mL/hr over 30 Minutes Intravenous 60 min pre-op 09/25/15 0709 09/25/15 0901   09/25/15 0709  ceFAZolin (ANCEF) IVPB 2g/100 mL premix  Status:  Discontinued     2 g 200 mL/hr over 30 Minutes Intravenous On call to O.R. 09/25/15 [id number] 09/25/15 0711   09/25/15 0709  metroNIDAZOLE (FLAGYL) IVPB 500 mg     500 mg 100 mL/hr over 60 Minutes Intravenous On call to O.R. 09/25/15 0709 09/25/15 0916        Note: Portions of this report may have been transcribed using voice recognition software. Every effort was made to ensure accuracy; however, inadvertent computerized transcription errors may be present.   Any transcriptional errors that result from this process are unintentional.     Ardeth SportsmanSteven C. Marika Mahaffy, M.D., F.A.C.S. Gastrointestinal and Minimally Invasive Surgery [facility] [address] [place], [place] [address] [phone] Main / Paging   09/29/2015

## 2015-09-29 NOTE — Evaluation (Signed)
Occupational Therapy Evaluation Patient Details Name: [name] MRN: [id number] DOB: October 26, 1940 Today's Date: 09/29/2015    History of Present Illness 75 y.o. male admitted with gastric outlet obstruction, s/p partial gastrectomy.  H/o HTN and back pain   Clinical Impression   Pt was admitted for the above. At baseline, he is independent and active.  Pt currently needs min guard for safety. At time of evaluation, he did have some confusion and needed cues for safety. Will follow in acute setting with supervision level goals.      Follow Up Recommendations  Supervision/Assistance - 24 hour    Equipment Recommendations   (wife will borrow a 3:1)    Recommendations for Other Services       Precautions / Restrictions Precautions Precautions: Fall Precaution Comments: abdominal sx Restrictions Weight Bearing Restrictions: No      Mobility Bed Mobility               General bed mobility comments: pt was sitting up in bed when I arrived  Transfers     Transfers: Sit to/from Stand Sit to Stand: Min guard         General transfer comment: for safety; stabilized walker, cues for hand placement    Balance                                            ADL Overall ADL's : Needs assistance/impaired     Grooming: Supervision/safety;Standing   Upper Body Bathing: Set up;Sitting   Lower Body Bathing: Min guard;Sit to/from stand   Upper Body Dressing : Set up;Sitting   Lower Body Dressing: Min guard;Sit to/from stand   Toilet Transfer: Min guard;Ambulation;Comfort height toilet;Grab bars   Toileting- Clothing Manipulation and Hygiene: Min guard;Sit to/from stand   Tub/ Engineer, structuralhower Transfer: Walk-in shower;Ambulation;Min guard     General ADL Comments: pt used RW--slightly unsteady, big turn but no LOB. Recommended shower seat and wife feels she can borrow one. When asked if she has any concerns, she just wants him to sleep so that she can  sleep. She has had multiple offers of help, and someone can sit with pt for her to take a nap. Pt is able to cross legs for adls     Vision     Perception     Praxis      Pertinent Vitals/Pain Pain Assessment: 0-10 Pain Score: 2  Pain Location: abdomen Pain Descriptors / Indicators: Sore Pain Intervention(s): Limited activity within patient's tolerance;Monitored during session;Premedicated before session     Hand Dominance     Extremity/Trunk Assessment Upper Extremity Assessment Upper Extremity Assessment: Overall WFL for tasks assessed           Communication Communication Communication: No difficulties   Cognition Arousal/Alertness: Awake/alert Behavior During Therapy: WFL for tasks assessed/performed Overall Cognitive Status: Impaired/Different from baseline Area of Impairment: Memory     Memory: Decreased short-term memory   Safety/Judgement: Decreased awareness of safety     General Comments: Pt a little confused:  asking if this was his room.   General Comments       Exercises       Shoulder Instructions      Home Living Family/patient expects to be discharged to:: Private residence Living Arrangements: Spouse/significant other Available Help at Discharge: Family;Available 24 hours/day  Bathroom Shower/Tub: Producer, television/film/videoWalk-in shower   Bathroom Toilet: Handicapped height     Home Equipment: None   Additional Comments: wife feels that she can borrow a shower seat      Prior Functioning/Environment Level of Independence: Independent             OT Diagnosis: Generalized weakness   OT Problem List: Decreased strength;Decreased activity tolerance;Impaired balance (sitting and/or standing);Decreased cognition;Pain;Decreased safety awareness;Decreased knowledge of use of DME or AE   OT Treatment/Interventions: Self-care/ADL training;DME and/or AE instruction;Cognitive remediation/compensation;Balance training;Patient/family  education;Therapeutic activities    OT Goals(Current goals can be found in the care plan section) Acute Rehab OT Goals Patient Stated Goal: return to exercise class and [place] [facility] OT Goal Formulation: With patient Time For Goal Achievement: 10/06/15 Potential to Achieve Goals: Good ADL Goals Pt Will Perform Grooming: with supervision;standing Pt Will Transfer to Toilet: with supervision;ambulating;bedside commode (19 inch toilet; without safety cues) Pt Will Perform Tub/Shower Transfer: Shower transfer;with supervision;ambulating Additional ADL Goal #1: pt will perform bed mobility,sidelying to sit with supervision and cues  OT Frequency: Min 2X/week   Barriers to D/C:            Co-evaluation              End of Session    Activity Tolerance: Patient tolerated treatment well (fatiqued at end of session) Patient left: in chair;with call bell/phone within reach;with chair alarm set;with family/visitor present   Time: 8295-62131009-1028 OT Time Calculation (min): 19 min Charges:  OT General Charges $OT Visit: 1 Procedure OT Evaluation $OT Eval Moderate Complexity: 1 Procedure G-Codes:    Leeandre Nordling 09/29/2015, 11:13 AM Marica OtterMaryellen Ruven Corradi, OTR/L [phone] 09/29/2015

## 2015-09-29 NOTE — Care Management Note (Signed)
Case Management Note  Patient Details  Name: [name] MRN: [id number] Date of Birth: 11-03-1940  Subjective/Objective:                  XI ROBOTIC ANTERIOR AND POSTERIOR VAGOTOMY, BILROTH I  ANASTOMOSIS DOR FUNDIPLICATION, OMENTOPEXY  UPPER ENDOSCOPY (N/A) Action/Plan: Discharge planning Expected Discharge Date: 09/30/15              Expected Discharge Plan:  [place]  In-House Referral:  NA  Discharge planning Services  CM Consult  Post Acute Care Choice:  NA Choice offered to:  NA  DME Arranged:  3-N-1, Walker rolling DME Agency:  [place]:  PT [place] Agency:  [facility]  Status of Service:  Completed, signed off  If discussed at [place] of Stay Meetings, dates discussed:    Additional Comments: CM met with pt in room to offer choice of HHPT.  Pt chooses [facility] to render HHPT.  Referral called to [facility] rep, Deena.  CM notified [facility] rep, Jermaine to please deliver the rolling walker and 3n1 prior to discharge.  No other CM needs were communicated. Dellie Catholic, RN 09/29/2015, 1:31 PM

## 2015-09-29 NOTE — Plan of Care (Signed)
Problem: Food- and Nutrition-Related Knowledge Deficit (NB-1.1) Goal: Nutrition education Formal process to instruct or train a patient/client in a skill or to impart knowledge to help patients/clients voluntarily manage or modify food choices and eating behavior to maintain or improve health. Outcome: Completed/Met Date Met: 09/29/15 Nutrition Education Note  Received consult for diet education regarding pt s/p partial gastrectomy.  Patient currently order dysphagia 1 diet (pureed). Pt's wife reports patient eating juice, eggs, toast and cheerios. These foods were not pureed. Reviewed what pureed foods are and how to prepare.   Discussed low fiber diet with patient and pt's wife at bedside. Reviewed low and high fiber foods. Pt provided with "Gastrectomy nutrition therapy" from [employer] which reviewed acceptable liquids (noncarbonated, low sugar and caffeine-free). Pt encouraged to consume small, frequent meals and to include daily multivitamin. Pt drinks Boost supplements at home, encouraged pt to continue supplements and consume 3-4 a day. Pt encouraged to separate liquids from solid meals.   Patient reports losing ~35 lb. Will order Boost supplements.  Clayton Bibles, MS, RD, LDN Pager: [phone] After Hours Pager: [phone]

## 2015-09-29 NOTE — Evaluation (Addendum)
Clinical/Bedside Swallow Evaluation Patient Details  Name: [name] MRN: [id number] Date of Birth: 03-26-1940  Today's Date: 09/29/2015 Time: SLP Start Time (ACUTE ONLY): 1200 SLP Stop Time (ACUTE ONLY): 1236 SLP Time Calculation (min) (ACUTE ONLY): 36 min  Past Medical History:  Past Medical History:  Diagnosis Date  . Bilateral dry eyes   . Coronary artery disease 1997   bare mental stent right coronart artery  . Degenerative disc disease    LOWER BACK  . Dysphonia   . ED (erectile dysfunction)   . Gastroesophageal reflux disease   . Hyperlipidemia   . Hypertension   . Peptic ulcer   . Pneumonia    HISTORY OF IN CHILDHOOD  . Urinary hesitancy   . Wears glasses    Past Surgical History:  Past Surgical History:  Procedure Laterality Date  . CARDIAC CATHETERIZATION    . CHOLECYSTECTOMY    . ESOPHAGOGASTRODUODENOSCOPY (EGD) WITH PROPOFOL N/A 07/01/2015   Procedure: ESOPHAGOGASTRODUODENOSCOPY (EGD) WITH PROPOFOL;  Surgeon: Willis ModenaWilliam Outlaw, MD;  Location: [facility];  Service: Endoscopy;  Laterality: N/A;  . EYE SURGERY     growth on right eye removed   . STENT TO HEART     2 1997 AND 1 IN 1996  . TONSILLECTOMY    . XI ROBOTIC VAGOTOMY AND ANTRECTOMY N/A 09/25/2015   Procedure: XI ROBOTIC ANTERIOR AND POSTERIOR VAGOTOMY, BILROTH I  ANASTOMOSIS DOR FUNDIPLICATION, OMENTOPEXY  UPPER ENDOSCOPY;  Surgeon: Karie SodaSteven Gross, MD;  Location: [facility];  Service: General;  Laterality: N/A;   HPI:  75 yo [facility] with acquired stricture of pylorus s/p vagatomy and distal gastrectomy.  Pt required surgery - and is s/p partial gastrectomy.  He is on a puree/thin diet due to stomach surgery.  Swallow evaluation ordered.    Assessment / Plan / Recommendation Clinical Impression  Pt presents with overall functional oropharyngeal swallow ability based on clinical swallow evaluation.  No overt indication of airway compromise with intake - and per UGI 06/2015 pt with "normal  oropharyngeal swallow mechanism".  Pt also with mild decreased facial sensation on mandibular branch of trigeminal nerve.  SLP did advise due to pt's dysarthria *progressing since January 2016 per pt* that he consume softer foods when/if his diet can be advanced by MD *partial gastrectomy.  Pt's dysarthria presentation is concerning for potential progressive neuro diagnosis - as he demonstrates rapid rate with imprecise articulation and apparent word finding deficits.  Pt's wife reports pt's speech to be more slurred since his medications this am but pt denies this to be accurate and states this is the same.  Also spouse reports pt has been drooling for the last 1 1/2 years.    SLP provided pt/spouse with education re: dysphagia compensation strategies.      Aspiration Risk  Mild aspiration risk    Diet Recommendation Thin liquid puree per md  Liquid Administration via: Cup;Straw Medication Administration: Whole meds with liquid Supervision: Patient able to self feed Compensations: Slow rate;Minimize environmental distractions;Small sips/bites small frequent meals   Other  Recommendations   n/a  Follow up Recommendations  None    Frequency and Duration            Prognosis        Swallow Study   General Date of Onset: 09/29/15 HPI: 75 yo [facility] with acquired stricture of pylorus s/p vagatomy and distal gastrectomy.  Pt required surgery - and is s/p partial gastrectomy.  He is on  a puree/thin diet due to stomach surgery.  Swallow evaluation ordered.  Type of Study: Bedside Swallow Evaluation Diet Prior to this Study: Dysphagia 1 (puree);Thin liquids Temperature Spikes Noted: No Respiratory Status: Room air History of Recent Intubation: No Behavior/Cognition: Alert;Cooperative;Pleasant mood Oral Cavity Assessment: Within Functional Limits Oral Care Completed by SLP: Yes Oral Cavity - Dentition: Adequate natural dentition Vision: Functional for self-feeding Self-Feeding  Abilities: Able to feed self Patient Positioning: Upright in bed Baseline Vocal Quality: Normal Volitional Cough: Strong Volitional Swallow: Able to elicit    Oral/Motor/Sensory Function Overall Oral Motor/Sensory Function: Mild impairment (decreased facial sensation - lower branch - speech is dysarthric)   Ice Chips Ice chips: Not tested   Thin Liquid Thin Liquid: Within functional limits Presentation: Self Fed;Straw    Nectar Thick Nectar Thick Liquid: Not tested   Honey Thick Honey Thick Liquid: Not tested   Puree Puree: Impaired Presentation: Self Fed;Spoon Oral Phase Impairments: Other (comment) (wfl) Other Comments: icecream   Solid   GO   Solid: Not tested        Danny Burnetamara Yizel Canby, MS [facility] SLP [phone]

## 2015-09-30 MED ORDER — METOPROLOL TARTRATE 5 MG/5ML IV SOLN: 5.0000 mg | Freq: Four times a day (QID) | INTRAVENOUS | Status: DC | PRN

## 2015-09-30 MED ORDER — METOPROLOL TARTRATE 5 MG/5ML IV SOLN
5.0000 mg | Freq: Four times a day (QID) | INTRAVENOUS | Status: DC
  Administered 2015-09-30 – 2015-10-01 (×2): 5 mg via INTRAVENOUS
  Filled 2015-09-30 (×2): qty 5

## 2015-09-30 MED ORDER — TRAMADOL HCL 50 MG PO TABS
50.0000 mg | ORAL_TABLET | Freq: Four times a day (QID) | ORAL | Status: DC | PRN
  Administered 2015-09-30: 50 mg via ORAL
  Filled 2015-09-30: qty 1

## 2015-09-30 MED ORDER — HYDRALAZINE HCL 20 MG/ML IJ SOLN: 5.0000 mg | Freq: Four times a day (QID) | INTRAMUSCULAR | Status: DC | PRN

## 2015-09-30 MED ORDER — BISACODYL 10 MG RE SUPP: 10.0000 mg | Freq: Two times a day (BID) | RECTAL | Status: DC | PRN

## 2015-09-30 MED ORDER — FENTANYL CITRATE (PF) 100 MCG/2ML IJ SOLN: 25.0000 ug | INTRAMUSCULAR | Status: DC | PRN

## 2015-09-30 MED ORDER — METOCLOPRAMIDE HCL 5 MG/ML IJ SOLN: 5.0000 mg | Freq: Four times a day (QID) | INTRAMUSCULAR | Status: DC | PRN

## 2015-09-30 MED ORDER — IRBESARTAN 75 MG PO TABS
75.0000 mg | ORAL_TABLET | Freq: Every day | ORAL | Status: DC
  Administered 2015-09-30 – 2015-10-01 (×2): 75 mg via ORAL
  Filled 2015-09-30 (×2): qty 1

## 2015-09-30 MED ORDER — POLYETHYLENE GLYCOL 3350 17 G PO PACK
17.0000 g | PACK | Freq: Every day | ORAL | Status: DC
  Administered 2015-09-30 – 2015-10-01 (×2): 17 g via ORAL
  Filled 2015-09-30 (×2): qty 1

## 2015-09-30 MED ORDER — METOCLOPRAMIDE HCL 5 MG/ML IJ SOLN
5.0000 mg | Freq: Four times a day (QID) | INTRAMUSCULAR | Status: DC
Start: 2015-09-30 — End: 2015-09-30
  Administered 2015-09-30: 5 mg via INTRAVENOUS
  Filled 2015-09-30: qty 2

## 2015-09-30 MED ORDER — ACETAMINOPHEN 500 MG PO TABS
1000.0000 mg | ORAL_TABLET | Freq: Three times a day (TID) | ORAL | Status: DC
  Administered 2015-09-30 – 2015-10-01 (×2): 1000 mg via ORAL
  Filled 2015-09-30 (×2): qty 2

## 2015-09-30 MED ORDER — METOCLOPRAMIDE HCL 5 MG PO TABS
5.0000 mg | ORAL_TABLET | Freq: Three times a day (TID) | ORAL | Status: DC
  Administered 2015-09-30 – 2015-10-01 (×2): 10 mg via ORAL
  Filled 2015-09-30 (×2): qty 2

## 2015-09-30 NOTE — Progress Notes (Signed)
OT Cancellation Note  Patient Details Name: [name] MRN: [id number] DOB: [birth year]   Cancelled Treatment:    Reason Eval/Treat Not Completed: Other (comment).  Spoke to pt and wife. She feels comfortable with shower transfer and 3:1 was delivered to go over his commode.  He reports that this helps take pressure from stomach/abdomen when getting up.  Will sign off--no further OT needs at this time.-  Domnick Chervenak 09/30/2015, 2:13 PM  [name], OTR/L [phone] 09/30/2015

## 2015-09-30 NOTE — Progress Notes (Signed)
Physical Therapy Treatment Patient Details Name: [name] MRN: [id number] DOB: January 18, 1941 Today's Date: 09/30/2015    History of Present Illness 75 y.o. male admitted with gastric outlet obstruction, s/p partial gastrectomy.  H/o HTN and back pain    PT Comments    Pt improving, incr activity tolerance today; may be able to use cane vs RW; wife concerned about DME already being delivered  Follow Up Recommendations  Home health PT     Equipment Recommendations  Other (comment) (?cane vs RW)    Recommendations for Other Services       Precautions / Restrictions Precautions Precautions: Fall Precaution Comments: abdominal sx Restrictions Weight Bearing Restrictions: No    Mobility  Bed Mobility Overal bed mobility: Needs Assistance Bed Mobility: Rolling;Sidelying to Sit Rolling: Supervision Sidelying to sit: Supervision;HOB elevated       General bed mobility comments: uses rail, cues fo rrolling  Transfers Overall transfer level: Needs assistance Equipment used: None Transfers: Sit to/from Stand Sit to Stand: Supervision         General transfer comment: for safety  Ambulation/Gait Ambulation/Gait assistance: Min guard;Min assist Ambulation Distance (Feet): 240 Feet Assistive device: 1 person hand held assist Gait Pattern/deviations: Step-through pattern Gait velocity: decr   General Gait Details: more unsteady without RW but no overt LOB; HHA throughout, continues to fatigue with gait but incr distance  tol    Stairs            Wheelchair Mobility    Modified Rankin (Stroke Patients Only)       Balance                             High level balance activites: Side stepping;Backward walking;Direction changes;Turns      Cognition Arousal/Alertness: Awake/alert Behavior During Therapy: WFL for tasks assessed/performed Overall Cognitive Status: Within Functional Limits for tasks assessed                       Exercises General Exercises - Lower Extremity Hip ABduction/ADduction: AROM;Strengthening;Both;15 reps;Standing Toe Raises: AROM;Strengthening;15 reps;Standing Heel Raises: AAROM;Strengthening;Both;15 reps    General Comments        Pertinent Vitals/Pain Pain Assessment: No/denies pain    Home Living                      Prior Function            PT Goals (current goals can now be found in the care plan section) Acute Rehab PT Goals Patient Stated Goal: return [facility] exercise class and [facility] PT Goal Formulation: With patient/family Time For Goal Achievement: 10/12/15 Potential to Achieve Goals: Good Progress towards PT goals: Progressing toward goals    Frequency  Min 3X/week    PT Plan Current plan remains appropriate    Co-evaluation             End of Session   Activity Tolerance: Patient tolerated treatment well Patient left: in chair;with call bell/phone within reach;with family/visitor present     Time: [id number] PT Time Calculation (min) (ACUTE ONLY): 23 min  Charges:  $Gait Training: 8-22 mins $Therapeutic Exercise: 8-22 mins                    G Codes:      Merlina Marchena 09/30/2015, 11:29 AM

## 2015-09-30 NOTE — Progress Notes (Signed)
Dr. Michaell CowingGross returned call and encouraged ambulation, assess for signs of increasing distention.  Will walk with patient and continue to monitor.

## 2015-09-30 NOTE — Progress Notes (Signed)
Patient complained of difficulty getting a deep breath.  Vitals obtained, HR 62, RR20, SpO2 97% on room air.  Patient denies lightheadedness, chest pain or pressure.  He states that the onset of shortness of breath was gradual.  He is using incentive spirometer and getting a consistent reading, breath sounds clear and diminished.  Called [facility] office and left message with triage nurse who stated she would contact surgeon or PA.  Will continue to monitor.

## 2015-10-01 MED ORDER — TRAMADOL HCL 50 MG PO TABS: 50.0000 mg | ORAL_TABLET | Freq: Four times a day (QID) | ORAL | 0 refills | Status: DC | PRN

## 2015-10-01 MED ORDER — METOCLOPRAMIDE HCL 5 MG PO TABS: 5.0000 mg | ORAL_TABLET | Freq: Four times a day (QID) | ORAL | 1 refills | Status: DC | PRN

## 2015-10-01 NOTE — Progress Notes (Signed)
[facility]  [address] [place], [place] [address] Phone: [phone] FAX: [phone]   [name] [id number] 1940/10/05  CARE TEAM:  PCP: Irven Shelling, MD  Outpatient Care Team: Patient Care Team: Lavone Orn, MD as PCP - General (Internal Medicine) Michael Boston, MD as Consulting Physician (General Surgery) Arta Silence, MD as Consulting Physician (Gastroenterology)  Inpatient Treatment Team: Treatment Team: Attending Provider: Michael Boston, MD; Registered Nurse: Bailey Mech, RN; Technician: Abbe Amsterdam, NT; Technician: Merian Capron, NT; Technician: Reginal Lutes, NT; Registered Nurse: Oleta Mouse, RN  Problem List:   Principal Problem:   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 09/25/2015 Active Problems:   Essential hypertension, benign   Dystrophy, Salzmann's nodular   Hyperlipidemia   Anxiety state   6 Days Post-Op  09/25/2015  POST-OPERATIVE DIAGNOSIS:  Partial gastric outlet obstruction due to chronic pyloric stricture  PROCEDURE:   XI ROBOTIC distal gastrectomy BILROTH I  ANASTOMOSIS  ANTERIOR AND POSTERIOR VAGOTOMY DOR (Anterior 177 degree) FUNDOPLICATION OMENTOPEXY UPPER ENDOSCOPY  SURGEON:  Michael Boston, MD    Assessment  Better  Plan:  -Dys 1 diet - adv to soft diet as tolerated -nutrition consult to help w post-gastrectomy diet (sep liquids from solids, smaller more frequent meals, etc) -SLP eval with no dysphagia issues -medlock IVF.  IVF PRN -switch to PO pain control -HTN control - inc to full AngioInh dose -anxiolysis - avoid BZ / ativan & follow -VTE prophylaxis- SCDs, etc -mobilize as tolerated to help recovery.  PT/OT evals  D/C patient from hospital when patient meets criteria (anticipate in 1-2 day(s)):  Tolerating oral intake well Ambulating well Adequate pain control without IV medications Urinating  Having  flatus Disposition planning in place   I updated the patient's status to the patient, nurse, and family.  D/w Dr Paulita Fujita with GI.  Recommendations were made.  Questions were answered.  They expressed understanding & appreciation.   [name], M.D., F.A.C.S. Gastrointestinal and Minimally Invasive Surgery [facility] [address], [place], [place] [address] [phone] Main / Paging   10/01/2015  Subjective:  Less bloated Having flatus & BMs Appetite OK Much more alert No nausea Walked down hallway more SLP eval w/o dysphagia Family at bedside  Objective:  Vital signs:  Vitals:   09/30/15 0603 09/30/15 1508 09/30/15 2103 10/01/15 0500  BP: (!) 159/59 (!) 160/52 (!) 156/63 (!) 153/48  Pulse: 72 70 (!) 56 (!) 56  Resp: 16 14 14 16   Temp: 98.6 F (37 C) 98 F (36.7 C) 98.4 F (36.9 C) 98 F (36.7 C)  TempSrc: Oral Oral Oral Oral  SpO2: 95% 96% 98% 99%  Weight:      Height:        Last BM Date: 09/29/15  Intake/Output   Yesterday:  08/16 0701 - 08/17 0700 In: 530 [P.O.:530] Out: 881 [Urine:880; Stool:1] This shift:  No intake/output data recorded.  Bowel function:  Flatus: No  BM:  No  Drain: Serosanguinous   Physical Exam:  General: Pt awake/alert/oriented x4 in No acute distress Eyes: PERRL, normal EOM.  Sclera clear.  No icterus Neuro: CN II-XII intact w/o focal sensory/motor deficits. Lymph: No head/neck/groin lymphadenopathy Psych:  No delerium/psychosis/paranoia.  Smiling, not groggy HENT: Normocephalic, Mucus membranes moist.  No thrush Neck: Supple, No tracheal deviation Chest: No chest wall pain w good excursion CV:  Pulses intact.  Regular rhythm MS:  Normal AROM mjr joints.  No obvious deformity Abdomen: Soft.  Mildy distended.  Mildly tender at incisions only.  No evidence of peritonitis.  No incarcerated hernias. Ext:  SCDs BLE.  No mjr edema.  No cyanosis Skin: No petechiae /  purpura  Results:   Labs: Results for orders placed or performed during the hospital encounter of 09/25/15 (from the past 48 hour(s))  Basic metabolic panel     Status: Abnormal   Collection Time: 09/29/15  9:07 AM  Result Value Ref Range   Sodium 132 (L) 135 - 145 mmol/L   Potassium 3.5 3.5 - 5.1 mmol/L   Chloride 98 (L) 101 - 111 mmol/L   CO2 27 22 - 32 mmol/L   Glucose, Bld 100 (H) 65 - 99 mg/dL   BUN 15 6 - 20 mg/dL   Creatinine, Ser 0.81 0.61 - 1.24 mg/dL   Calcium 8.3 (L) 8.9 - 10.3 mg/dL   GFR calc non Af Amer >60 >60 mL/min   GFR calc Af Amer >60 >60 mL/min    Comment: (NOTE) The eGFR has been calculated using the CKD EPI equation. This calculation has not been validated in all clinical situations. eGFR's persistently <60 mL/min signify possible Chronic Kidney Disease.    Anion gap 7 5 - 15  Magnesium     Status: None   Collection Time: 09/29/15  9:07 AM  Result Value Ref Range   Magnesium 2.0 1.7 - 2.4 mg/dL  CBC     Status: Abnormal   Collection Time: 09/29/15  9:07 AM  Result Value Ref Range   WBC 10.5 4.0 - 10.5 K/uL   RBC 3.95 (L) 4.22 - 5.81 MIL/uL   Hemoglobin 12.3 (L) 13.0 - 17.0 g/dL   HCT 35.3 (L) 39.0 - 52.0 %   MCV 89.4 78.0 - 100.0 fL   MCH 31.1 26.0 - 34.0 pg   MCHC 34.8 30.0 - 36.0 g/dL   RDW 12.4 11.5 - 15.5 %   Platelets 238 150 - 400 K/uL    Imaging / Studies: No results found.  Medications / Allergies: per chart  Antibiotics: Anti-infectives    Start     Dose/Rate Route Frequency Ordered Stop   09/25/15 1800  ceFAZolin (ANCEF) IVPB 2g/100 mL premix     2 g 200 mL/hr over 30 Minutes Intravenous Every 8 hours 09/25/15 1608 09/26/15 0654   09/25/15 1800  metroNIDAZOLE (FLAGYL) IVPB 500 mg     500 mg 100 mL/hr over 60 Minutes Intravenous Every 6 hours 09/25/15 1608 09/26/15 0610   09/25/15 0709  ceFAZolin (ANCEF) IVPB 2g/100 mL premix     2 g 200 mL/hr over 30 Minutes Intravenous 60 min pre-op 09/25/15 0709 09/25/15 0901   09/25/15  0709  ceFAZolin (ANCEF) IVPB 2g/100 mL premix  Status:  Discontinued     2 g 200 mL/hr over 30 Minutes Intravenous On call to O.R. 09/25/15 2440 09/25/15 0711   09/25/15 0709  metroNIDAZOLE (FLAGYL) IVPB 500 mg     500 mg 100 mL/hr over 60 Minutes Intravenous On call to O.R. 09/25/15 0709 09/25/15 0916        Note: Portions of this report may have been transcribed using voice recognition software. Every effort was made to ensure accuracy; however, inadvertent computerized transcription errors may be present.   Any transcriptional errors that result from this process are unintentional.     [name], M.D., F.A.C.S. Gastrointestinal and Minimally Invasive Surgery [facility] [address] [place], [place] [address] [phone] Main / Paging   10/01/2015

## 2015-10-01 NOTE — Discharge Summary (Signed)
Physician Discharge Summary  Patient ID: [name] MRN: [id number] DOB/AGE: [birth year] 75 y.o.  Admit date: 09/25/2015 Discharge date: 10/01/2015  Patient Care Team: Lavone Orn, MD as PCP - General (Internal Medicine) Michael Boston, MD as Consulting Physician (General Surgery) Arta Silence, MD as Consulting Physician (Gastroenterology)  Admission Diagnoses: Principal Problem:   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 09/25/2015 Active Problems:   Essential hypertension, benign   Dystrophy, Salzmann's nodular   Hyperlipidemia   Anxiety state   Discharge Diagnoses:  Principal Problem:   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 09/25/2015 Active Problems:   Essential hypertension, benign   Dystrophy, Salzmann's nodular   Hyperlipidemia   Anxiety state   PRE-OPERATIVE DIAGNOSIS:  Partial gastric outlet obstruction due to chronic pyloric stricture  POST-OPERATIVE DIAGNOSIS:  Partial gastric outlet obstruction due to chronic pyloric stricture  PROCEDURE:   XI ROBOTIC distal gastrectomy BILROTH I  ANASTOMOSIS  ANTERIOR AND POSTERIOR VAGOTOMY DOR (Anterior 646 degree) FUNDIPLICATION OMENTOPEXY UPPER ENDOSCOPY  SURGEON:  Surgeon(s): Michael Boston, MD  Consults: PT, OT, SLP  Hospital Course:   The patient underwent the surgery above.  Tolerated nasogastric cramping trial.  Nasogastric tube removed.  Had intermittent bloating and fullness.  Eventually began to have flatus and bowel movements.  He was able to be advanced to a soft diet.  Pain and other symptoms were treated aggressively.    By the time of discharge, the patient was walking well the hallways, eating food, having flatus.  Pain was well-controlled on an oral medications.  Based on meeting discharge criteria and continuing to recover, I felt it was safe for the patient to be discharged from the hospital to further recover with close followup. Postoperative  recommendations were discussed in detail.  They are written as well.   Significant Diagnostic Studies:  Results for orders placed or performed during the hospital encounter of 09/25/15 (from the past 72 hour(s))  Basic metabolic panel     Status: Abnormal   Collection Time: 09/29/15  9:07 AM  Result Value Ref Range   Sodium 132 (L) 135 - 145 mmol/L   Potassium 3.5 3.5 - 5.1 mmol/L   Chloride 98 (L) 101 - 111 mmol/L   CO2 27 22 - 32 mmol/L   Glucose, Bld 100 (H) 65 - 99 mg/dL   BUN 15 6 - 20 mg/dL   Creatinine, Ser 0.81 0.61 - 1.24 mg/dL   Calcium 8.3 (L) 8.9 - 10.3 mg/dL   GFR calc non Af Amer >60 >60 mL/min   GFR calc Af Amer >60 >60 mL/min    Comment: (NOTE) The eGFR has been calculated using the CKD EPI equation. This calculation has not been validated in all clinical situations. eGFR's persistently <60 mL/min signify possible Chronic Kidney Disease.    Anion gap 7 5 - 15  Magnesium     Status: None   Collection Time: 09/29/15  9:07 AM  Result Value Ref Range   Magnesium 2.0 1.7 - 2.4 mg/dL  CBC     Status: Abnormal   Collection Time: 09/29/15  9:07 AM  Result Value Ref Range   WBC 10.5 4.0 - 10.5 K/uL   RBC 3.95 (L) 4.22 - 5.81 MIL/uL   Hemoglobin 12.3 (L) 13.0 - 17.0 g/dL   HCT 35.3 (L) 39.0 - 52.0 %   MCV 89.4 78.0 - 100.0 fL   MCH 31.1 26.0 - 34.0 pg   MCHC 34.8 30.0 - 36.0 g/dL  RDW 12.4 11.5 - 15.5 %   Platelets 238 150 - 400 K/uL    No results found.  Discharge Exam: Blood pressure (!) 153/48, pulse (!) 56, temperature 98 F (36.7 C), temperature source Oral, resp. rate 16, height 6' (1.829 m), weight 91.2 kg (201 lb), SpO2 99 %.  General: Pt awake/alert/oriented x4 in no major acute distress Eyes: PERRL, normal EOM. Sclera nonicteric Neuro: CN II-XII intact w/o focal sensory/motor deficits. Lymph: No head/neck/groin lymphadenopathy Psych:  No delerium/psychosis/paranoia HENT: Normocephalic, Mucus membranes moist.  No thrush Neck: Supple, No tracheal  deviation Chest: No pain.  Good respiratory excursion. CV:  Pulses intact.  Regular rhythm MS: Normal AROM mjr joints.  No obvious deformity Abdomen: Soft, Much less distended.  Min tender at old drain site and right lower quadrant and extraction site left lower quadrant..  No incarcerated hernias. Ext:  SCDs BLE.  No significant edema.  No cyanosis Skin: No petechiae / purpura  Discharged Condition: good   Past Medical History:  Diagnosis Date  . Bilateral dry eyes   . Coronary artery disease 1997   bare mental stent right coronart artery  . Degenerative disc disease    LOWER BACK  . Dysphonia   . ED (erectile dysfunction)   . Gastroesophageal reflux disease   . Hyperlipidemia   . Hypertension   . Peptic ulcer   . Pneumonia    HISTORY OF IN CHILDHOOD  . Urinary hesitancy   . Wears glasses     Past Surgical History:  Procedure Laterality Date  . CARDIAC CATHETERIZATION    . CHOLECYSTECTOMY    . ESOPHAGOGASTRODUODENOSCOPY (EGD) WITH PROPOFOL N/A 07/01/2015   Procedure: ESOPHAGOGASTRODUODENOSCOPY (EGD) WITH PROPOFOL;  Surgeon: Arta Silence, MD;  Location: [facility];  Service: Endoscopy;  Laterality: N/A;  . EYE SURGERY     growth on right eye removed   . STENT TO HEART     2 1997 AND 1 IN 1996  . TONSILLECTOMY    . XI ROBOTIC VAGOTOMY AND ANTRECTOMY N/A 09/25/2015   Procedure: XI ROBOTIC ANTERIOR AND POSTERIOR VAGOTOMY, BILROTH I  ANASTOMOSIS DOR FUNDIPLICATION, OMENTOPEXY  UPPER ENDOSCOPY;  Surgeon: Michael Boston, MD;  Location: [facility];  Service: General;  Laterality: N/A;    Social History   Social History  . Marital status: Married    Spouse name: N/A  . Number of children: N/A  . Years of education: N/A   Occupational History  . Not on file.   Social History Main Topics  . Smoking status: Former Smoker    Packs/day: 2.00    Years: 20.00    Types: Cigarettes    Quit date: 02/15/1995  . Smokeless tobacco: Never Used  . Alcohol use No  . Drug use: No  .  Sexual activity: Not on file   Other Topics Concern  . Not on file   Social History Narrative  . No narrative on file    Family History  Problem Relation Age of Onset  . Heart attack Mother     Current Facility-Administered Medications  Medication Dose Route Frequency Provider Last Rate Last Dose  . 0.9 %  sodium chloride infusion  250 mL Intravenous PRN Michael Boston, MD      . acetaminophen (TYLENOL) suppository 650 mg  650 mg Rectal Q6H PRN Michael Boston, MD      . acetaminophen (TYLENOL) tablet 1,000 mg  1,000 mg Oral TID Michael Boston, MD   1,000 mg at 09/30/15 2104  . acetaminophen (  TYLENOL) tablet 325-650 mg  325-650 mg Oral Q6H PRN Michael Boston, MD      . alum & mag hydroxide-simeth (MAALOX/MYLANTA) 200-200-20 MG/5ML suspension 30 mL  30 mL Oral Q6H PRN Michael Boston, MD      . antiseptic oral rinse (CPC / CETYLPYRIDINIUM CHLORIDE 0.05%) solution 7 mL  7 mL Mouth Rinse BID Michael Boston, MD   7 mL at 09/30/15 2200  . aspirin EC tablet 81 mg  81 mg Oral QPM Michael Boston, MD   81 mg at 09/30/15 2104  . atorvastatin (LIPITOR) tablet 40 mg  40 mg Oral QHS Michael Boston, MD   40 mg at 09/30/15 2104  . bisacodyl (DULCOLAX) suppository 10 mg  10 mg Rectal Q12H PRN Michael Boston, MD      . cycloSPORINE (RESTASIS) 0.05 % ophthalmic emulsion 1 drop  1 drop Both Eyes BID Michael Boston, MD   1 drop at 09/30/15 2105  . diphenhydrAMINE (BENADRYL) 12.5 MG/5ML elixir 12.5 mg  12.5 mg Oral Q6H PRN Michael Boston, MD       Or  . diphenhydrAMINE (BENADRYL) injection 12.5 mg  12.5 mg Intravenous Q6H PRN Michael Boston, MD   12.5 mg at 09/26/15 2150  . enoxaparin (LOVENOX) injection 40 mg  40 mg Subcutaneous Q24H Jackolyn Confer, MD   40 mg at 09/30/15 1019  . fentaNYL (SUBLIMAZE) injection 25-50 mcg  25-50 mcg Intravenous Q2H PRN Michael Boston, MD      . guaiFENesin-dextromethorphan [facility] DM) 100-10 MG/5ML syrup 15 mL  15 mL Oral Q4H PRN Michael Boston, MD      . hydrALAZINE (APRESOLINE) injection 5-20 mg   5-20 mg Intravenous Q6H PRN Michael Boston, MD      . irbesartan (AVAPRO) tablet 75 mg  75 mg Oral Daily Michael Boston, MD   75 mg at 09/30/15 2104  . lactose free nutrition (BOOST PLUS) liquid 237 mL  237 mL Oral TID WC Michael Boston, MD   237 mL at 09/30/15 0800  . lip balm (CARMEX) ointment 1 application  1 application Topical BID Michael Boston, MD   1 application at 26/83/41 2200  . magic mouthwash  15 mL Oral QID PRN Michael Boston, MD   15 mL at 09/26/15 2124  . menthol-cetylpyridinium (CEPACOL) lozenge 3 mg  1 lozenge Oral PRN Michael Boston, MD   3 mg at 09/26/15 0430  . methocarbamol (ROBAXIN) 1,000 mg in dextrose 5 % 50 mL IVPB  1,000 mg Intravenous Q6H PRN Michael Boston, MD      . metoCLOPramide (REGLAN) injection 5-10 mg  5-10 mg Intravenous Q6H PRN Michael Boston, MD      . metoCLOPramide (REGLAN) tablet 5-10 mg  5-10 mg Oral TID AC & HS Michael Boston, MD   10 mg at 09/30/15 2104  . metoprolol (LOPRESSOR) injection 5 mg  5 mg Intravenous Q6H Michael Boston, MD   5 mg at 10/01/15 0502  . metoprolol (LOPRESSOR) injection 5 mg  5 mg Intravenous Q6H PRN Michael Boston, MD      . nitroGLYCERIN (NITROSTAT) SL tablet 0.4 mg  0.4 mg Sublingual Q5 min PRN Michael Boston, MD      . ondansetron (ZOFRAN-ODT) disintegrating tablet 4 mg  4 mg Oral Q6H PRN Michael Boston, MD       Or  . ondansetron [facility]) injection 4 mg  4 mg Intravenous Q6H PRN Michael Boston, MD      . pantoprazole (PROTONIX) EC tablet 40 mg  40 mg Oral Daily  Michael Boston, MD   40 mg at 09/30/15 1019  . phenol (CHLORASEPTIC) mouth spray 1 spray  1 spray Mouth/Throat PRN Jackolyn Confer, MD      . polyethylene glycol (MIRALAX / GLYCOLAX) packet 17 g  17 g Oral Daily Michael Boston, MD   17 g at 09/30/15 2105  . polyvinyl alcohol (LIQUIFILM TEARS) 1.4 % ophthalmic solution 2 drop  2 drop Both Eyes Q8H PRN Michael Boston, MD      . prochlorperazine (COMPAZINE) injection 5-10 mg  5-10 mg Intravenous Q4H PRN Michael Boston, MD      . sodium chloride flush (NS)  0.9 % injection 3 mL  3 mL Intravenous Q12H Michael Boston, MD   3 mL at 09/30/15 2200  . sodium chloride flush (NS) 0.9 % injection 3 mL  3 mL Intravenous PRN Michael Boston, MD      . traMADol Veatrice Bourbon) tablet 50-100 mg  50-100 mg Oral Q6H PRN Michael Boston, MD   50 mg at 09/30/15 2219     Allergies  Allergen Reactions  . Flomax [Tamsulosin Hcl] Other (See Comments)    Leg weakness  . Lisinopril Cough  . Uroxatral [Alfuzosin Hcl Er] Other (See Comments)    Leg weakness    Disposition: 01-Home or Self Care  Discharge Instructions    Call MD for:    Complete by:  As directed   Temperature > 101.7F   Call MD for:  extreme fatigue    Complete by:  As directed   Call MD for:  hives    Complete by:  As directed   Call MD for:  persistant nausea and vomiting    Complete by:  As directed   Call MD for:  redness, tenderness, or signs of infection (pain, swelling, redness, odor or green/yellow discharge around incision site)    Complete by:  As directed   Call MD for:  severe uncontrolled pain    Complete by:  As directed   Diet general    Complete by:  As directed   Follow a light diet the first few days at home.  Start with a bland diet such as soups, liquids, starchy foods, low fat foods, etc.  If you feel full, bloated, or constipated, stay on a ful liquid or pureed/blenderized diet for a few days until you feel better and no longer constipated.  Be sure to drink plenty of fluids every day to avoid getting dehydrated (feeling dizzy, not urinating, etc.)  Consider 6-8 smaller meals   Discharge instructions    Complete by:  As directed   Please see discharge instruction sheets.   Also refer to any handouts/printouts that may have been given from the [facility] surgery office (if you visited [place] there before surgery) Please call our office if you have any questions or concerns [phone]   Discharge wound care:    Complete by:  As directed   If you have closed incisions: Shower and bathe over  these incisions with soap and water every day.  It is OK to wash over the dressings: they are waterproof. Remove all surgical dressings on postoperative day #3.  You do not need to replace dressings over the closed incisions unless you feel more comfortable with a Band-Aid covering it.   If you have an open wound: That requires packing, so please see wound care instructions.   In general, remove all dressings, wash wound with soap and water and then replace with saline moistened gauze.  Do the dressing change at least every day.    Please call our office [phone] if you have further questions.   Driving Restrictions    Complete by:  As directed   No driving until off narcotics and can safely swerve away without pain during an emergency   Increase activity slowly    Complete by:  As directed   Lifting restrictions    Complete by:  As directed   Avoid heavy lifting initially, <20 pounds at first.   Do not push through pain.   You have no specific weight limit: If it hurts to do, DON'T DO IT.    If you feel no pain, you are not injuring anything.  Pain will protect you from injury.   Coughing and sneezing are far more stressful to your incision than any lifting.   Avoid resuming heavy lifting (>50 pounds) or other intense activity until off all narcotic pain medications.   When want to exercise more, give yourself 2 weeks to gradually get back to full intense exercise/activity.   May shower / Bathe    Complete by:  As directed   [place].  It is fine for dressings or wounds to be washed/rinsed.  Use gentle soap & water.  This will help the incisions and/or wounds get clean & minimize infection.   May walk up steps    Complete by:  As directed   Sexual Activity Restrictions    Complete by:  As directed   Sexual activity as tolerated.  Do not push through pain.  Pain will protect you from injury.   Walk with assistance    Complete by:  As directed   Walk over an hour a day.  May use a  walker/cane/companion to help with balance and stamina.       Medication List    TAKE these medications   aspirin EC 81 MG tablet Take 81 mg by mouth every evening.   atorvastatin 40 MG tablet Commonly known as:  LIPITOR Take 40 mg by mouth at bedtime.   metoCLOPramide 5 MG tablet Commonly known as:  REGLAN Take 1-2 tablets (5-10 mg total) by mouth every 6 (six) hours as needed for nausea or vomiting.   multivitamin tablet Take 1 tablet by mouth daily.   nitroGLYCERIN 0.4 MG SL tablet Commonly known as:  NITROSTAT Place 0.4 mg under the tongue every 5 (five) minutes as needed for chest pain.   pantoprazole 40 MG tablet Commonly known as:  PROTONIX Take 40 mg by mouth 2 (two) times daily.   REFRESH OP Place 2 drops into both eyes daily as needed (For dry eyes.).   RESTASIS 0.05 % ophthalmic emulsion Generic drug:  cycloSPORINE Place 1 drop into both eyes 2 (two) times daily.   STOOL SOFTENER PO Take 1 capsule by mouth as needed (for constipation).   traMADol 50 MG tablet Commonly known as:  ULTRAM Take 1-2 tablets (50-100 mg total) by mouth every 6 (six) hours as needed for moderate pain or severe pain.   valsartan 80 MG tablet Commonly known as:  DIOVAN Take 80 mg by mouth daily.      Follow-up Information    [place] .   Why:  home health physical therapy and roling walker and a 3n1 (over the commode seat) Contact information: [place] [address] [phone]            Signed: Morton Peters,  M.D., F.A.C.S. Gastrointestinal and Minimally Invasive Surgery [facility] [address] [place], [place] [address] [phone] Main / Paging   10/01/2015, 7:22 AM

## 2015-11-03 ENCOUNTER — Other Ambulatory Visit: Payer: Self-pay | Admitting: Internal Medicine

## 2015-11-03 DIAGNOSIS — R109 Unspecified abdominal pain: Secondary | ICD-10-CM

## 2015-11-09 ENCOUNTER — Ambulatory Visit
Admission: RE | Admit: 2015-11-09 | Discharge: 2015-11-09 | Disposition: A | Discharge status: Home / Self Care | Payer: Medicare Other | Source: Ambulatory Visit | Attending: Internal Medicine | Admitting: Internal Medicine

## 2015-11-09 DIAGNOSIS — R109 Unspecified abdominal pain: Secondary | ICD-10-CM

## 2015-11-09 MED ORDER — IOPAMIDOL (ISOVUE-300) INJECTION 61%
100.0000 mL | Freq: Once | INTRAVENOUS | Status: AC | PRN
Start: 2015-11-09 — End: 2015-11-09
  Administered 2015-11-09: 100 mL via INTRAVENOUS

## 2015-11-10 ENCOUNTER — Telehealth: Payer: Self-pay | Admitting: Neurology

## 2015-11-10 ENCOUNTER — Other Ambulatory Visit: Payer: Self-pay | Admitting: Internal Medicine

## 2015-11-10 DIAGNOSIS — R1013 Epigastric pain: Secondary | ICD-10-CM

## 2015-11-10 NOTE — Telephone Encounter (Signed)
Dr. Valentina LucksGriffin called requests call back from Dr. Anne HahnWillis regarding a new patient he would like for Dr. Anne HahnWillis to see. Please call [phone].

## 2015-11-10 NOTE — Telephone Encounter (Signed)
I talk with Dr. Valentina LucksGriffin. The patient has had progressive issues with dysarthria, he has concerns about ALS, I would agree with these concerns. We will see the patient in the next week or 2 for an evaluation.

## 2015-11-11 NOTE — Telephone Encounter (Signed)
Called and spoke to pt and his wife. Appt scheduled for Wed 11/25/15 w/ 11 am arrival time.

## 2015-11-12 ENCOUNTER — Ambulatory Visit
Admission: RE | Admit: 2015-11-12 | Discharge: 2015-11-12 | Disposition: A | Discharge status: Home / Self Care | Payer: Medicare Other | Source: Ambulatory Visit | Attending: Internal Medicine | Admitting: Internal Medicine

## 2015-11-12 ENCOUNTER — Other Ambulatory Visit: Payer: Self-pay | Admitting: Internal Medicine

## 2015-11-12 ENCOUNTER — Emergency Department (HOSPITAL_COMMUNITY)
Admission: EM | Admit: 2015-11-12 | Discharge: 2015-11-12 | Disposition: A | Discharge status: Home / Self Care | Payer: Medicare Other | Attending: Emergency Medicine | Admitting: Emergency Medicine

## 2015-11-12 DIAGNOSIS — Z955 Presence of coronary angioplasty implant and graft: Secondary | ICD-10-CM | POA: Diagnosis not present

## 2015-11-12 DIAGNOSIS — R1010 Upper abdominal pain, unspecified: Secondary | ICD-10-CM | POA: Insufficient documentation

## 2015-11-12 DIAGNOSIS — R1013 Epigastric pain: Secondary | ICD-10-CM

## 2015-11-12 DIAGNOSIS — I2510 Atherosclerotic heart disease of native coronary artery without angina pectoris: Secondary | ICD-10-CM | POA: Diagnosis not present

## 2015-11-12 DIAGNOSIS — Z7982 Long term (current) use of aspirin: Secondary | ICD-10-CM | POA: Insufficient documentation

## 2015-11-12 DIAGNOSIS — Z79899 Other long term (current) drug therapy: Secondary | ICD-10-CM | POA: Diagnosis not present

## 2015-11-12 DIAGNOSIS — Z87891 Personal history of nicotine dependence: Secondary | ICD-10-CM | POA: Insufficient documentation

## 2015-11-12 DIAGNOSIS — I10 Essential (primary) hypertension: Secondary | ICD-10-CM | POA: Diagnosis not present

## 2015-11-12 NOTE — ED Provider Notes (Signed)
[facility]-EMERGENCY DEPT Provider Note   CSN: [id number] Arrival date & time: 11/12/15  [id number]     History   Chief Complaint No chief complaint on file.   HPI [name] is a 75 y.o. male.  HPI  Patient presents with concern of ongoing upper abdominal discomfort, bloating sensation, anorexia, weakness. Patient has multiple medical issues, including recent surgery of lesion that was causing gastric outlet obstruction. He notes that since the surgery he has had persistent symptoms, mild, with no relief from anything. No other new complaints, including no fever, no vomiting, change in bowel movements, no chest pain,dyspnea. 2 days ago the patient had CT scan performed, demonstrating findings concerning for gastric outlet obstruction. Today the patient went for additional studies, which were not completed due to the patient's retained contrast material. Patient sent here for evaluation.   Past Medical History:  Diagnosis Date  . Bilateral dry eyes   . Coronary artery disease 1997   bare mental stent right coronart artery  . Degenerative disc disease    LOWER BACK  . Dysphonia   . ED (erectile dysfunction)   . Gastroesophageal reflux disease   . Hyperlipidemia   . Hypertension   . Peptic ulcer   . Pneumonia    HISTORY OF IN CHILDHOOD  . Urinary hesitancy   . Wears glasses     Patient Active Problem List   Diagnosis Date Noted  . Anxiety state 09/28/2015  . Hyperlipidemia   . 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 Coronary atherosclerosis of native coronary artery 04/24/2013  . Essential hypertension, benign 04/24/2013  . Other and unspecified hyperlipidemia 04/24/2013  . Central pterygium 10/30/2012  . 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 Posterior vitreous detachment 10/30/2012  . 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 Cataract, nuclear sclerotic senile 10/30/2012    Past Surgical History:  Procedure Laterality Date    . CARDIAC CATHETERIZATION    . CHOLECYSTECTOMY    . ESOPHAGOGASTRODUODENOSCOPY (EGD) WITH PROPOFOL N/A 07/01/2015   Procedure: ESOPHAGOGASTRODUODENOSCOPY (EGD) WITH PROPOFOL;  Surgeon: Willis ModenaWilliam Outlaw, MD;  Location: [facility];  Service: Endoscopy;  Laterality: N/A;  . EYE SURGERY     growth on right eye removed   . STENT TO HEART     2 1997 AND 1 IN 1996  . TONSILLECTOMY    . XI ROBOTIC VAGOTOMY AND ANTRECTOMY N/A 09/25/2015   Procedure: XI ROBOTIC ANTERIOR AND POSTERIOR VAGOTOMY, BILROTH I  ANASTOMOSIS DOR FUNDIPLICATION, OMENTOPEXY  UPPER ENDOSCOPY;  Surgeon: Karie SodaSteven Gross, MD;  Location: [facility]S;  Service: General;  Laterality: N/A;       Home Medications    Prior to Admission medications   Medication Sig Start Date End Date Taking? Authorizing Provider  aspirin EC 81 MG tablet Take 81 mg by mouth every evening.     Historical Provider, MD  atorvastatin (LIPITOR) 40 MG tablet Take 40 mg by mouth at bedtime.     Historical Provider, MD  Docusate Calcium (STOOL SOFTENER PO) Take 1 capsule by mouth as needed (for constipation).    Historical Provider, MD  metoCLOPramide (REGLAN) 5 MG tablet Take 1-2 tablets (5-10 mg total) by mouth every 6 (six) hours as needed for nausea or vomiting. 10/01/15   Karie SodaSteven Gross, MD  Multiple Vitamin (MULTIVITAMIN) tablet Take 1 tablet by mouth daily.    Historical Provider, MD  nitroGLYCERIN (NITROSTAT) 0.4 MG SL tablet Place 0.4 mg under the tongue every 5 (five) minutes as needed for chest  pain.    Historical Provider, MD  pantoprazole (PROTONIX) 40 MG tablet Take 40 mg by mouth 2 (two) times daily. 06/02/15   Historical Provider, MD  Polyvinyl Alcohol-Povidone (REFRESH OP) Place 2 drops into both eyes daily as needed (For dry eyes.).    Historical Provider, MD  RESTASIS 0.05 % ophthalmic emulsion Place 1 drop into both eyes 2 (two) times daily.  04/16/13   Historical Provider, MD  traMADol (ULTRAM) 50 MG tablet Take 1-2 tablets (50-100 mg total) by mouth  every 6 (six) hours as needed for moderate pain or severe pain. 10/01/15   Karie Soda, MD  valsartan (DIOVAN) 80 MG tablet Take 80 mg by mouth daily.    Historical Provider, MD    Family History Family History  Problem Relation Age of Onset  . Heart attack Mother     Social History Social History  Substance Use Topics  . Smoking status: Former Smoker    Packs/day: 2.00    Years: 20.00    Types: Cigarettes    Quit date: 02/15/1995  . Smokeless tobacco: Never Used  . Alcohol use No     Allergies   Flomax [tamsulosin hcl]; Lisinopril; and Uroxatral [alfuzosin hcl er]   Review of Systems Review of Systems  Constitutional:       Per HPI, otherwise negative  HENT:       Per HPI, otherwise negative  Respiratory:       Per HPI, otherwise negative  Cardiovascular:       Per HPI, otherwise negative  Gastrointestinal: Positive for nausea. Negative for vomiting.  Endocrine:       Negative aside from HPI  Genitourinary:       Neg aside from HPI   Musculoskeletal:       Per HPI, otherwise negative  Skin: Negative.   Neurological: Positive for weakness. Negative for syncope.     Physical Exam Updated Vital Signs BP 139/83 (BP Location: Right Arm)   Pulse 78   Temp 97.7 F (36.5 C) (Oral)   Resp 16   Ht 6' (1.829 m)   Wt 193 lb (87.5 kg)   SpO2 94%   BMI 26.18 kg/m   Physical Exam  Constitutional: He is oriented to person, place, and time. He appears well-developed. No distress.  HENT:  Head: Normocephalic and atraumatic.  Eyes: Conjunctivae and EOM are normal.  Cardiovascular: Normal rate and regular rhythm.   Pulmonary/Chest: Effort normal. No stridor. No respiratory distress.  Abdominal: He exhibits distension. There is tenderness. There is guarding.  Upper abdominal guarding with exam  Musculoskeletal: He exhibits no edema.  Neurological: He is alert and oriented to person, place, and time.  Skin: Skin is warm and dry.  Psychiatric: He has a normal mood  and affect.  Nursing note and vitals reviewed.    ED Treatments / Results  Labs  Radiology Dg Ugi  W/kub  Result Date: 11/12/2015 CLINICAL DATA:  History of gastric outlet obstruction with distal gastrectomy and Billroth 1 anastomosis by surgical history. EXAM: WATER SOLUBLE UPPER GI SERIES TECHNIQUE: Single-column upper GI series was performed using water soluble contrast. CONTRAST:  Water-soluble contrast COMPARISON:  CT abdomen pelvis of 11/09/2015 FLUOROSCOPY TIME:  Fluoroscopy Time:  54 seconds Radiation Exposure Index (if provided by the fluoroscopic device): 67 deciGy per square cm Number of Acquired Spot Images: 0 FINDINGS: A preliminary KUB shows massive distention of the stomach which appears to be fluid filled. Surgical clips are noted in the right upper  quadrant. The remainder of the distal bowel is unremarkable. Initially rapid sequence spot films of the cervical esophagus were performed in the frontal projection. The swallowing mechanism is unremarkable. Again in the erect position there is a large amount of fluid distending the stomach. Esophageal peristalsis is unremarkable. However there is so much fluid in the stomach that the water-soluble contrast is considerably diluted, resulting in poor definition of anatomy. Therefore, I contacted Dr. [name] and discussed the findings. He will contact the gastroenterologist and consider endoscopy to assess further. IMPRESSION: There is considerable fluid distention of the stomach consistent with persistent gastric outlet obstruction. This study is very limited as described above due to dilution of contrast by the large amount of fluid remaining in the stomach. Dr. Valentina Lucks will contact the gastroenterologist and consider endoscopy to evaluate further. Electronically Signed   By: Dwyane Dee M.D.   On: 11/12/2015 09:37    Procedures Procedures (including critical care time)   Initial Impression / Assessment and Plan / ED Course  I have  reviewed the triage vital signs and the nursing notes.  Pertinent labs & imaging results that were available during my care of the patient were reviewed by me and considered in my medical decision making (see chart for details).  Clinical Course    Chart review notable for multiple studies of the past few days to evaluate the patient's ongoing abdominal discomfort, bloating, weakness. After my initial evaluation I discussed this case with his gastroenterology team.  1:13 PM Patient sitting upright, in no distress. Patient has been evaluated by his gastroenterology team. They discussed admission for endoscopy tomorrow versus outpatient follow-up, and the patient elected for discharge, with outpatient follow-up.  I discussed this, the importance of following up with the patient and his wife, and he will be discharged.  Final Clinical Impressions(s) / ED Diagnoses  Patient with history of recent partial gastrectomy presents with new upper abdominal discomfort, bloating sensation, and outpatient study concerning for gastric outlet obstruction. Here the patient is awake, alert. He does have tenderness to the patient in the upper abdomen, but no evidence for peritonitis, nor bowel obstruction, as he continues to have bowel movements. No vomiting in the emergency department. Patient's case discussed with gastroenterology, who also evaluated the patient, arranged for close outpatient follow-up for endoscopy.    Gerhard Munch, MD 11/12/15 1315

## 2015-11-12 NOTE — ED Notes (Signed)
GI at bedside

## 2015-11-12 NOTE — Consult Note (Signed)
Referring Provider:  ER MD Primary Care Physician:  Lillia Mountain, MD Primary Gastroenterologist:  Dr.Outlaw   Reason for Consultation:  Gastric outlet obstruction  HPI: [name] is a 75 y.o. male 1 underwent a distal gastrectomy along with Vagotomy for pyloric stricture on 09/25/2015 was referred  for upper GI series for further evaluation of abdominal distention and abdominal pain. Patient's primary care physician was notified by [facility] imaging that patient would not able to complete the study because of retained food in the stomach Patient was subsequently asked to come to the ER for further evaluation. GI is asked for further evaluation.  Patient seen and examined while in the ER. Patient is complaining of abdominal distention followed by pain from distention since surgery.  denied any nausea or vomiting to me. Last bowel movement was yesterday which was normal. Denied any blood in the stool or black stool. he is complaining of decreased appetite and weight loss and surgery. Also complaining of early satiety.  he feels full with a few bites of meal. Also complaining of worsening acid reflux since surgery  Last EGD was in May 2017 which showed pyloric stenosis.  Past Medical History:  Diagnosis Date  . Bilateral dry eyes   . Coronary artery disease 1997   bare mental stent right coronart artery  . Degenerative disc disease    LOWER BACK  . Dysphonia   . ED (erectile dysfunction)   . Gastroesophageal reflux disease   . Hyperlipidemia   . Hypertension   . Peptic ulcer   . Pneumonia    HISTORY OF IN CHILDHOOD  . Urinary hesitancy   . Wears glasses     Past Surgical History:  Procedure Laterality Date  . CARDIAC CATHETERIZATION    . CHOLECYSTECTOMY    . ESOPHAGOGASTRODUODENOSCOPY (EGD) WITH PROPOFOL N/A 07/01/2015   Procedure: ESOPHAGOGASTRODUODENOSCOPY (EGD) WITH PROPOFOL;  Surgeon: Willis Modena, MD;  Location: [facility] END[facility];  Service: Endoscopy;  Laterality: N/A;   . EYE SURGERY     growth on right eye removed   . STENT TO HEART     2 1997 AND 1 IN 1996  . TONSILLECTOMY    . XI ROBOTIC VAGOTOMY AND ANTRECTOMY N/A 09/25/2015   Procedure: XI ROBOTIC ANTERIOR AND POSTERIOR VAGOTOMY, BILROTH I  ANASTOMOSIS DOR FUNDIPLICATION, OMENTOPEXY  UPPER ENDOSCOPY;  Surgeon: Karie Soda, MD;  Location: [facility];  Service: General;  Laterality: N/A;    Prior to Admission medications   Medication Sig Start Date End Date Taking? Authorizing Provider  ALPRAZolam Prudy Feeler) 0.5 MG tablet Take 0.5 mg by mouth at bedtime as needed for sleep or anxiety. 10/02/15  Yes Historical Provider, MD  aspirin EC 81 MG tablet Take 81 mg by mouth every evening.    Yes Historical Provider, MD  atorvastatin (LIPITOR) 40 MG tablet Take 40 mg by mouth at bedtime.    Yes Historical Provider, MD  Docusate Calcium (STOOL SOFTENER PO) Take 1 capsule by mouth as needed (for constipation).   Yes Historical Provider, MD  Multiple Vitamin (MULTIVITAMIN) tablet Take 1 tablet by mouth daily.   Yes Historical Provider, MD  nitroGLYCERIN (NITROSTAT) 0.4 MG SL tablet Place 0.4 mg under the tongue every 5 (five) minutes as needed for chest pain.   Yes Historical Provider, MD  pantoprazole (PROTONIX) 40 MG tablet Take 40 mg by mouth 2 (two) times daily. 06/02/15  Yes Historical Provider, MD  Polyvinyl Alcohol-Povidone (REFRESH OP) Place 2 drops into both eyes daily as needed (For dry  eyes.).   Yes Historical Provider, MD  RESTASIS 0.05 % ophthalmic emulsion Place 1 drop into both eyes 2 (two) times daily.  04/16/13  Yes Historical Provider, MD  valsartan (DIOVAN) 80 MG tablet Take 80 mg by mouth daily.   Yes Historical Provider, MD  metoCLOPramide (REGLAN) 5 MG tablet Take 1-2 tablets (5-10 mg total) by mouth every 6 (six) hours as needed for nausea or vomiting. Patient not taking: Reported on 11/12/2015 10/01/15   Karie SodaSteven Gross, MD  traMADol (ULTRAM) 50 MG tablet Take 1-2 tablets (50-100 mg total) by mouth every 6  (six) hours as needed for moderate pain or severe pain. Patient not taking: Reported on 11/12/2015 10/01/15   Karie SodaSteven Gross, MD    Scheduled Meds: Continuous Infusions: PRN Meds:.  Allergies as of 11/12/2015 - Review Complete 11/12/2015  Allergen Reaction Noted  . Flomax [tamsulosin hcl] Other (See Comments) 06/18/2015  . Lisinopril Cough 04/24/2013  . Uroxatral [alfuzosin hcl er] Other (See Comments) 06/18/2015    Family History  Problem Relation Age of Onset  . Heart attack Mother     Social History   Social History  . Marital status: Married    Spouse name: N/A  . Number of children: N/A  . Years of education: N/A   Occupational History  . Not on file.   Social History Main Topics  . Smoking status: Former Smoker    Packs/day: 2.00    Years: 20.00    Types: Cigarettes    Quit date: 02/15/1995  . Smokeless tobacco: Never Used  . Alcohol use No  . Drug use: No  . Sexual activity: Not on file   Other Topics Concern  . Not on file   Social History Narrative  . No narrative on file    Review of Systems: All negative except as stated above in HPI.  Physical Exam: Vital signs: Vitals:   11/12/15 1117 11/12/15 1225  BP: (!) 116/53 (!) 164/105  Pulse: (!) 55 92  Resp: 14 18  Temp:  98 F (36.7 C)     General:   Alert,  Well-developed, well-nourished, pleasant and cooperative in NAD HEENT-normocephalic/atraumatic Lungs:  Clear throughout to auscultation.   No wheezes, crackles, or rhonchi. No acute distress. Heart:  Regular rate and rhythm; no murmurs, clicks, rubs,  or gallops. Abdomen: Mildly distended, no definite tenderness to palpation, bowel sounds present. Lower extremity-no edema Rectal:  Deferred  GI:  Lab Results: No results for input(s): WBC, HGB, HCT, PLT in the last 72 hours. BMET No results for input(s): NA, K, CL, CO2, GLUCOSE, BUN, CREATININE, CALCIUM in the last 72 hours. LFT No results for input(s): PROT, ALBUMIN, AST, ALT, ALKPHOS,  BILITOT, BILIDIR, IBILI in the last 72 hours. PT/INR No results for input(s): LABPROT, INR in the last 72 hours.   Studies/Results: Dg Ugi  W/kub  Result Date: 11/12/2015 CLINICAL DATA:  History of gastric outlet obstruction with distal gastrectomy and Billroth 1 anastomosis by surgical history. EXAM: WATER SOLUBLE UPPER GI SERIES TECHNIQUE: Single-column upper GI series was performed using water soluble contrast. CONTRAST:  Water-soluble contrast COMPARISON:  CT abdomen pelvis of 11/09/2015 FLUOROSCOPY TIME:  Fluoroscopy Time:  54 seconds Radiation Exposure Index (if provided by the fluoroscopic device): 67 deciGy per square cm Number of Acquired Spot Images: 0 FINDINGS: A preliminary KUB shows massive distention of the stomach which appears to be fluid filled. Surgical clips are noted in the right upper quadrant. The remainder of the distal bowel is unremarkable.  Initially rapid sequence spot films of the cervical esophagus were performed in the frontal projection. The swallowing mechanism is unremarkable. Again in the erect position there is a large amount of fluid distending the stomach. Esophageal peristalsis is unremarkable. However there is so much fluid in the stomach that the water-soluble contrast is considerably diluted, resulting in poor definition of anatomy. Therefore, I contacted Dr. [name] and discussed the findings. He will contact the gastroenterologist and consider endoscopy to assess further. IMPRESSION: There is considerable fluid distention of the stomach consistent with persistent gastric outlet obstruction. This study is very limited as described above due to dilution of contrast by the large amount of fluid remaining in the stomach. Dr. Valentina Lucks will contact the gastroenterologist and consider endoscopy to evaluate further. Electronically Signed   By: Dwyane Dee M.D.   On: 11/12/2015 09:37    Impression/Plan: - Abdominal distention following gastric resection for pyloric  stenosis. Most likely gastroparesis. Patient denied any nausea or vomiting. - Early satiety  Recommendations -------------------------- - Patient declined hospital admission. Stated that he does not have any nausea or vomiting and he does not wants to stay overnight for observation. - Advised  patient to stay on liquid diet for a week and call us back in the clinic to set up outpatient endoscopy - Okay to discharge from GI standpoint as patient is declining admission.   LOS: 0 days   Kathi Der  MD, FACP 11/12/2015, 1:10 PM  Pager [phone] If no answer or after 5 PM call [phone]

## 2015-11-12 NOTE — Discharge Instructions (Signed)
As discussed, it is important that you follow up as soon as possible with your physician for continued management of your condition. ° °If you develop any new, or concerning changes in your condition, please return to the emergency department immediately. ° °

## 2015-11-12 NOTE — ED Triage Notes (Signed)
Pt complains of discomfort in the belly, says Dr. Valentina LucksGriffin, PCP was [facility] that they were unable to view anything in the belly, because the patient's stomach was full; Dr. Valentina LucksGriffin than told the patient to come to the ED to have his stomach pumped; patient states last meal was at 8 pm last night; pt denies nausea/vomiting; last bowel movement was yesterday morning; pt complains of weakness.

## 2015-11-12 NOTE — ED Notes (Signed)
Pt being sent by PCP office.  C/o abdominal distention d/t gastric outlet obstruction.  Pt had "ulcer surgery" in August.  Pt had a CT x 2 days ago.  Pt is followed by Dulce Sellarutlaw MD w/ GI.

## 2015-11-20 ENCOUNTER — Encounter (HOSPITAL_COMMUNITY): Payer: Self-pay | Admitting: *Deleted

## 2015-11-20 NOTE — Progress Notes (Signed)
Pt denies SOB, chest pain, and being under the care of a cardiologist. Pt made aware to stop taking vitamins, fish oil and herbal medications. Do not take any NSAIDs ie: Ibuprofen, Advil, Naproxen, BC and Goody Powder. Pt verbalized understanding of all pre-op instructions. Rica MastAngela, Kabbe, NP, Anesthesia, asked to review pt cardiac history.

## 2015-11-23 ENCOUNTER — Encounter (HOSPITAL_COMMUNITY): Payer: Self-pay | Admitting: Anesthesiology

## 2015-11-23 ENCOUNTER — Ambulatory Visit (HOSPITAL_COMMUNITY): Payer: Medicare Other | Admitting: Anesthesiology

## 2015-11-23 ENCOUNTER — Encounter (HOSPITAL_COMMUNITY)
Admission: RE | Disposition: A | Discharge status: Home / Self Care | Payer: Self-pay | Source: Ambulatory Visit | Attending: Gastroenterology

## 2015-11-23 ENCOUNTER — Ambulatory Visit (HOSPITAL_COMMUNITY)
Admission: RE | Admit: 2015-11-23 | Discharge: 2015-11-23 | Disposition: A | Discharge status: Home / Self Care | Payer: Medicare Other | Source: Ambulatory Visit | Attending: Gastroenterology | Admitting: Gastroenterology

## 2015-11-23 ENCOUNTER — Other Ambulatory Visit: Payer: Self-pay | Admitting: Gastroenterology

## 2015-11-23 DIAGNOSIS — Z5309 Procedure and treatment not carried out because of other contraindication: Secondary | ICD-10-CM

## 2015-11-23 DIAGNOSIS — I2510 Atherosclerotic heart disease of native coronary artery without angina pectoris: Secondary | ICD-10-CM

## 2015-11-23 DIAGNOSIS — M1990 Unspecified osteoarthritis, unspecified site: Secondary | ICD-10-CM | POA: Insufficient documentation

## 2015-11-23 DIAGNOSIS — Z955 Presence of coronary angioplasty implant and graft: Secondary | ICD-10-CM

## 2015-11-23 DIAGNOSIS — R1084 Generalized abdominal pain: Secondary | ICD-10-CM | POA: Insufficient documentation

## 2015-11-23 DIAGNOSIS — Z931 Gastrostomy status: Secondary | ICD-10-CM | POA: Insufficient documentation

## 2015-11-23 DIAGNOSIS — Z87891 Personal history of nicotine dependence: Secondary | ICD-10-CM | POA: Insufficient documentation

## 2015-11-23 HISTORY — PX: ESOPHAGOGASTRODUODENOSCOPY (EGD) WITH PROPOFOL: SHX5813

## 2015-11-23 SURGERY — ESOPHAGOGASTRODUODENOSCOPY (EGD) WITH PROPOFOL
Anesthesia: Monitor Anesthesia Care

## 2015-11-23 MED ORDER — SUCCINYLCHOLINE CHLORIDE 20 MG/ML IJ SOLN
INTRAMUSCULAR | Status: DC | PRN
  Administered 2015-11-23: 80 mg via INTRAVENOUS

## 2015-11-23 MED ORDER — LACTATED RINGERS IV SOLN
INTRAVENOUS | Status: DC
Start: 2015-11-23 — End: 2015-11-23
  Administered 2015-11-23: 11:00:00 via INTRAVENOUS

## 2015-11-23 MED ORDER — PROPOFOL 10 MG/ML IV BOLUS
INTRAVENOUS | Status: DC | PRN
  Administered 2015-11-23: 150 mg via INTRAVENOUS

## 2015-11-23 MED ORDER — ONDANSETRON HCL 4 MG/2ML IJ SOLN
INTRAMUSCULAR | Status: DC | PRN
  Administered 2015-11-23: 4 mg via INTRAVENOUS

## 2015-11-23 MED ORDER — SODIUM CHLORIDE 0.9 % IV SOLN: INTRAVENOUS | Status: DC

## 2015-11-23 MED ORDER — LIDOCAINE HCL (CARDIAC) 20 MG/ML IV SOLN
INTRAVENOUS | Status: DC | PRN
  Administered 2015-11-23: 100 mg via INTRAVENOUS

## 2015-11-23 NOTE — Transfer of Care (Signed)
Immediate Anesthesia Transfer of Care Note  Patient: [name]  Procedure(s) Performed: Procedure(s) with comments: ESOPHAGOGASTRODUODENOSCOPY (EGD) WITH PROPOFOL (N/A) - may need to intubate  Patient Location: Endoscopy Unit  Anesthesia Type:General  Level of Consciousness: awake, oriented and patient cooperative  Airway & Oxygen Therapy: Patient Spontanous Breathing and Patient connected to nasal cannula oxygen  Post-op Assessment: Report given to RN and Post -op Vital signs reviewed and stable  Post vital signs: Reviewed  Last Vitals:  Vitals:   11/23/15 1020 11/23/15 1206  BP: (!) 139/50 (!) 196/55  Pulse: 63 (!) 57  Resp: 20 17  Temp: 36.7 C     Last Pain:  Vitals:   11/23/15 1020  TempSrc: Oral         Complications: No apparent anesthesia complications

## 2015-11-23 NOTE — Anesthesia Preprocedure Evaluation (Signed)
Anesthesia Evaluation  Patient identified by MRN, date of birth, ID band Patient awake    Reviewed: Allergy & Precautions, H&P , NPO status , Patient's Chart, lab work & pertinent test results  Airway Mallampati: III  TM Distance: >3 FB Neck ROM: Full    Dental no notable dental hx. (+) Teeth Intact, Dental Advisory Given   Pulmonary pneumonia, former smoker,    Pulmonary exam normal breath sounds clear to auscultation       Cardiovascular hypertension, Pt. on medications + CAD and + Cardiac Stents   Rhythm:Regular Rate:Normal     Neuro/Psych negative neurological ROS  negative psych ROS   GI/Hepatic Neg liver ROS, PUD, GERD  Medicated and Controlled,  Endo/Other  negative endocrine ROS  Renal/GU negative Renal ROS     Musculoskeletal  (+) Arthritis , Osteoarthritis,    Abdominal   Peds  Hematology negative hematology ROS (+)   Anesthesia Other Findings   Reproductive/Obstetrics negative OB ROS                             Anesthesia Physical  Anesthesia Plan  ASA: III  Anesthesia Plan: MAC   Post-op Pain Management:    Induction: Intravenous  Airway Management Planned:   Additional Equipment:   Intra-op Plan:   Post-operative Plan:   Informed Consent: I have reviewed the patients History and Physical, chart, labs and discussed the procedure including the risks, benefits and alternatives for the proposed anesthesia with the patient or authorized representative who has indicated his/her understanding and acceptance.   Dental advisory given  Plan Discussed with: CRNA  Anesthesia Plan Comments:        Anesthesia Quick Evaluation

## 2015-11-23 NOTE — Op Note (Signed)
[facility] Patient Name: [name] Procedure Date : 11/23/2015 MRN: [id number] Attending MD: [name] , MD Date of Birth: 1940/06/24 CSN: [id number] Age: 75 Admit Type: Outpatient Procedure:                Upper GI endoscopy Indications:              Generalized abdominal pain, Suspected stenosis of                            the stomach, Suspected pyloric stenosis, Follow-up                            of pyloric stenosis, Abnormal UGI series, Abdominal                            bloating Providers:                Danny Modena, MD, Tomma Rakers, RN, Oletha Blend, Technician Referring MD:              Medicines:                General Anesthesia Complications:            No immediate complications. Estimated Blood Loss:     Estimated blood loss was minimal. Procedure:                Pre-Anesthesia Assessment:                           - Prior to the procedure, a History and Physical                            was performed, and patient medications and                            allergies were reviewed. The patient's tolerance of                            previous anesthesia was also reviewed. The risks                            and benefits of the procedure and the sedation                            options and risks were discussed with the patient.                            All questions were answered, and informed consent                            was obtained. Prior Anticoagulants: The patient has                            taken aspirin. ASA Grade  Assessment: III - A                            patient with severe systemic disease. After                            reviewing the risks and benefits, the patient was                            deemed in satisfactory condition to undergo the                            procedure.                           After obtaining informed consent, the endoscope was              passed under direct vision. Throughout the                            procedure, the patient's blood pressure, pulse, and                            oxygen saturations were monitored continuously. The                            EG-[id number] ([id number]) scope was introduced through the                            mouth, and advanced to the antrum of the stomach.                            The upper GI endoscopy was accomplished without                            difficulty. The patient tolerated the procedure                            well. Scope In: Scope Out: Findings:      The examined esophagus was normal.      Evidence of a stenosed Billroth I gastroduodenostomy was found. A       gastric pouch with a large size was found containing food debris. The       gastroduodenal anastomosis was characterized by ulceration. This was not       traversed. Impression:               - Normal esophagus.                           - Suspected stenosed Billroth I gastroduodenostomy                            was found, characterized by ulceration. Unable to                            traverse  with standard endoscope. Stomach too full                            of food to try pediatric scope. Moderate Sedation:      None Recommendation:           - Patient has a contact number available for                            emergencies. The signs and symptoms of potential                            delayed complications were discussed with the                            patient. Return to normal activities tomorrow.                            Written discharge instructions were provided to the                            patient.                           - Discharge patient to home (via wheelchair).                           - Full liquid diet today.                           - Continue present medications.                           - Refer to a surgeon tomorrow.                           - Return to  GI clinic after studies are complete.                           - Return to referring physician as previously                            scheduled. Procedure Code(s):        --- Professional ---                           [phone], 52, Esophagogastroduodenoscopy, flexible,                            transoral; diagnostic, including collection of                            specimen(s) by brushing or washing, when performed                            (separate procedure) Diagnosis Code(s):        --- Professional ---  Z98.0, Intestinal bypass and anastomosis status                           R10.84, Generalized abdominal pain                           K31.1, Adult hypertrophic pyloric stenosis                           R14.0, Abdominal distension (gaseous)                           R93.3, Abnormal findings on diagnostic imaging of                            other parts of digestive tract CPT copyright 2016 American Medical Association. All rights reserved. The codes documented in this report are preliminary and upon coder review may  be revised to meet current compliance requirements. Danny ModenaWilliam Lenita Peregrina, MD 11/23/2015 12:02:51 PM This report has been signed electronically. Number of Addenda: 0

## 2015-11-23 NOTE — Discharge Instructions (Signed)
YOU HAD AN ENDOSCOPIC PROCEDURE TODAY: Refer to the procedure report and other information in the discharge instructions given to you for any specific questions about what was found during the examination. If this information does not answer your questions, please call [facility] office at [phone] to clarify.  ° °YOU SHOULD EXPECT: Some feelings of bloating in the abdomen. Passage of more gas than usual. Walking can help get rid of the air that was put into your GI tract during the procedure and reduce the bloating. If you had a lower endoscopy (such as a colonoscopy or flexible sigmoidoscopy) you may notice spotting of blood in your stool or on the toilet paper. Some abdominal soreness may be present for a day or two, also. ° °DIET: Your first meal following the procedure should be a light meal and then it is ok to progress to your normal diet. A half-sandwich or bowl of soup is an example of a good first meal. Heavy or fried foods are harder to digest and may make you feel nauseous or bloated. Drink plenty of fluids but you should avoid alcoholic beverages for 24 hours. If you had a esophageal dilation, please see attached instructions for diet.  ° °ACTIVITY: Your care partner should take you home directly after the procedure. You should plan to take it easy, moving slowly for the rest of the day. You can resume normal activity the day after the procedure however YOU SHOULD NOT DRIVE, use power tools, machinery or perform tasks that involve climbing or major physical exertion for 24 hours (because of the sedation medicines used during the test).  ° °SYMPTOMS TO REPORT IMMEDIATELY: °A gastroenterologist can be reached at any hour. Please call [phone]  for any of the following symptoms:  °Following lower endoscopy (colonoscopy, flexible sigmoidoscopy) °Excessive amounts of blood in the stool  °Significant tenderness, worsening of abdominal pains  °Swelling of the abdomen that is new, acute  °Fever of 100° or  higher  °Following upper endoscopy (EGD, EUS, ERCP, esophageal dilation) °Vomiting of blood or coffee ground material  °New, significant abdominal pain  °New, significant chest pain or pain under the shoulder blades  °Painful or persistently difficult swallowing  °New shortness of breath  °Black, tarry-looking or red, bloody stools ° °FOLLOW UP:  °If any biopsies were taken you will be contacted by phone or by letter within the next 1-3 weeks. Call [phone]  if you have not heard about the biopsies in 3 weeks.  °Please also call with any specific questions about appointments or follow up tests. ° °

## 2015-11-23 NOTE — H&P (Signed)
Patient interval history reviewed.  Patient examined again.  There has been no change from documented H/P dated 11/20/15 (scanned into chart from our office) except as documented above.  Assessment:  1.  Nausea and vomiting. 2.  Distended stomach. 3.  Recent (2 months ago) partial gastrectomy for gastric outlet obstruction, benign pathology.  Plan:  1.  Endoscopy. 2.  Risks (bleeding, infection, bowel perforation that could require surgery, sedation-related changes in cardiopulmonary systems), benefits (identification and possible treatment of source of symptoms, exclusion of certain causes of symptoms), and alternatives (watchful waiting, radiographic imaging studies, empiric medical treatment) of upper endoscopy (EGD) were explained to patient/family in detail and patient wishes to proceed.

## 2015-11-23 NOTE — Anesthesia Procedure Notes (Addendum)
Procedure Name: Intubation Date/Time: 11/23/2015 11:31 AM Performed by: Lovie CholOCK, Lebaron Bautch K Pre-anesthesia Checklist: Patient identified, Emergency Drugs available, Suction available and Patient being monitored Patient Re-evaluated:Patient Re-evaluated prior to inductionOxygen Delivery Method: Circle System Utilized Preoxygenation: Pre-oxygenation with 100% oxygen Intubation Type: IV induction Ventilation: Mask ventilation without difficulty Grade View: Grade I Tube type: Oral Tube size: 7.5 mm Number of attempts: 1 Airway Equipment and Method: Oral airway,  Video-laryngoscopy and Rigid stylet Placement Confirmation: ETT inserted through vocal cords under direct vision,  positive ETCO2 and breath sounds checked- equal and bilateral Secured at: 23 cm Tube secured with: Tape Dental Injury: Teeth and Oropharynx as per pre-operative assessment

## 2015-11-23 NOTE — Anesthesia Postprocedure Evaluation (Signed)
Anesthesia Post Note  Patient: [name]  Procedure(s) Performed: Procedure(s) (LRB): ESOPHAGOGASTRODUODENOSCOPY (EGD) WITH PROPOFOL (N/A)  Patient location during evaluation: PACU Anesthesia Type: General Level of consciousness: sedated and patient cooperative Pain management: pain level controlled Vital Signs Assessment: post-procedure vital signs reviewed and stable Respiratory status: spontaneous breathing Cardiovascular status: stable Anesthetic complications: no    Last Vitals:  Vitals:   11/23/15 1230 11/23/15 1240  BP: (!) 156/57 (!) 177/54  Pulse: (!) 56 (!) 55  Resp: 16 16  Temp:      Last Pain:  Vitals:   11/23/15 1020  TempSrc: Oral                 Lewie LoronJohn Cameron Schwinn

## 2015-11-24 ENCOUNTER — Encounter (HOSPITAL_COMMUNITY): Payer: Self-pay | Admitting: Gastroenterology

## 2015-11-24 ENCOUNTER — Ambulatory Visit: Payer: Self-pay | Admitting: Surgery

## 2015-11-24 ENCOUNTER — Other Ambulatory Visit: Payer: Self-pay | Admitting: Surgery

## 2015-11-24 ENCOUNTER — Inpatient Hospital Stay (HOSPITAL_COMMUNITY)
Admission: AD | Admit: 2015-11-24 | Discharge: 2015-12-30 | DRG: 003 | Disposition: A | Discharge status: Skilled Nursing Facility | Payer: Medicare Other | Source: Ambulatory Visit | Attending: Surgery | Admitting: Surgery

## 2015-11-24 DIAGNOSIS — K311 Adult hypertrophic pyloric stenosis: Principal | ICD-10-CM | POA: Diagnosis present

## 2015-11-24 DIAGNOSIS — R739 Hyperglycemia, unspecified: Secondary | ICD-10-CM | POA: Diagnosis not present

## 2015-11-24 DIAGNOSIS — Z930 Tracheostomy status: Secondary | ICD-10-CM

## 2015-11-24 DIAGNOSIS — K208 Other esophagitis: Secondary | ICD-10-CM | POA: Diagnosis present

## 2015-11-24 DIAGNOSIS — R627 Adult failure to thrive: Secondary | ICD-10-CM | POA: Diagnosis present

## 2015-11-24 DIAGNOSIS — N179 Acute kidney failure, unspecified: Secondary | ICD-10-CM | POA: Diagnosis not present

## 2015-11-24 DIAGNOSIS — E7800 Pure hypercholesterolemia, unspecified: Secondary | ICD-10-CM | POA: Diagnosis present

## 2015-11-24 DIAGNOSIS — Z0189 Encounter for other specified special examinations: Secondary | ICD-10-CM

## 2015-11-24 DIAGNOSIS — E875 Hyperkalemia: Secondary | ICD-10-CM | POA: Diagnosis not present

## 2015-11-24 DIAGNOSIS — R6521 Severe sepsis with septic shock: Secondary | ICD-10-CM | POA: Diagnosis not present

## 2015-11-24 DIAGNOSIS — Z980 Intestinal bypass and anastomosis status: Secondary | ICD-10-CM

## 2015-11-24 DIAGNOSIS — Z934 Other artificial openings of gastrointestinal tract status: Secondary | ICD-10-CM

## 2015-11-24 DIAGNOSIS — I10 Essential (primary) hypertension: Secondary | ICD-10-CM | POA: Diagnosis present

## 2015-11-24 DIAGNOSIS — K9429 Other complications of gastrostomy: Secondary | ICD-10-CM | POA: Diagnosis not present

## 2015-11-24 DIAGNOSIS — Z8249 Family history of ischemic heart disease and other diseases of the circulatory system: Secondary | ICD-10-CM

## 2015-11-24 DIAGNOSIS — Z720 Tobacco use: Secondary | ICD-10-CM

## 2015-11-24 DIAGNOSIS — Z79899 Other long term (current) drug therapy: Secondary | ICD-10-CM

## 2015-11-24 DIAGNOSIS — R601 Generalized edema: Secondary | ICD-10-CM | POA: Diagnosis not present

## 2015-11-24 DIAGNOSIS — Z8711 Personal history of peptic ulcer disease: Secondary | ICD-10-CM

## 2015-11-24 DIAGNOSIS — E785 Hyperlipidemia, unspecified: Secondary | ICD-10-CM | POA: Diagnosis present

## 2015-11-24 DIAGNOSIS — R188 Other ascites: Secondary | ICD-10-CM

## 2015-11-24 DIAGNOSIS — I998 Other disorder of circulatory system: Secondary | ICD-10-CM | POA: Diagnosis not present

## 2015-11-24 DIAGNOSIS — E878 Other disorders of electrolyte and fluid balance, not elsewhere classified: Secondary | ICD-10-CM | POA: Diagnosis not present

## 2015-11-24 DIAGNOSIS — K9413 Enterostomy malfunction: Secondary | ICD-10-CM

## 2015-11-24 DIAGNOSIS — Z955 Presence of coronary angioplasty implant and graft: Secondary | ICD-10-CM

## 2015-11-24 DIAGNOSIS — T85598D Other mechanical complication of other gastrointestinal prosthetic devices, implants and grafts, subsequent encounter: Secondary | ICD-10-CM

## 2015-11-24 DIAGNOSIS — G9341 Metabolic encephalopathy: Secondary | ICD-10-CM | POA: Diagnosis not present

## 2015-11-24 DIAGNOSIS — K653 Choleperitonitis: Secondary | ICD-10-CM | POA: Diagnosis not present

## 2015-11-24 DIAGNOSIS — Z9911 Dependence on respirator [ventilator] status: Secondary | ICD-10-CM

## 2015-11-24 DIAGNOSIS — Z7982 Long term (current) use of aspirin: Secondary | ICD-10-CM

## 2015-11-24 DIAGNOSIS — Z6821 Body mass index (BMI) 21.0-21.9, adult: Secondary | ICD-10-CM

## 2015-11-24 DIAGNOSIS — F17220 Nicotine dependence, chewing tobacco, uncomplicated: Secondary | ICD-10-CM | POA: Diagnosis present

## 2015-11-24 DIAGNOSIS — IMO0002 Reserved for concepts with insufficient information to code with codable children: Secondary | ICD-10-CM

## 2015-11-24 DIAGNOSIS — K2970 Gastritis, unspecified, without bleeding: Secondary | ICD-10-CM

## 2015-11-24 DIAGNOSIS — Y92230 Patient room in hospital as the place of occurrence of the external cause: Secondary | ICD-10-CM | POA: Diagnosis not present

## 2015-11-24 DIAGNOSIS — Z09 Encounter for follow-up examination after completed treatment for conditions other than malignant neoplasm: Secondary | ICD-10-CM

## 2015-11-24 DIAGNOSIS — K257 Chronic gastric ulcer without hemorrhage or perforation: Secondary | ICD-10-CM | POA: Diagnosis present

## 2015-11-24 DIAGNOSIS — E876 Hypokalemia: Secondary | ICD-10-CM | POA: Diagnosis not present

## 2015-11-24 DIAGNOSIS — Z978 Presence of other specified devices: Secondary | ICD-10-CM

## 2015-11-24 DIAGNOSIS — Y828 Other medical devices associated with adverse incidents: Secondary | ICD-10-CM | POA: Diagnosis not present

## 2015-11-24 DIAGNOSIS — E2749 Other adrenocortical insufficiency: Secondary | ICD-10-CM | POA: Diagnosis not present

## 2015-11-24 DIAGNOSIS — K651 Peritoneal abscess: Secondary | ICD-10-CM | POA: Diagnosis not present

## 2015-11-24 DIAGNOSIS — R491 Aphonia: Secondary | ICD-10-CM

## 2015-11-24 DIAGNOSIS — I742 Embolism and thrombosis of arteries of the upper extremities: Secondary | ICD-10-CM | POA: Diagnosis not present

## 2015-11-24 DIAGNOSIS — D62 Acute posthemorrhagic anemia: Secondary | ICD-10-CM | POA: Diagnosis not present

## 2015-11-24 DIAGNOSIS — I2510 Atherosclerotic heart disease of native coronary artery without angina pectoris: Secondary | ICD-10-CM | POA: Diagnosis present

## 2015-11-24 DIAGNOSIS — J9690 Respiratory failure, unspecified, unspecified whether with hypoxia or hypercapnia: Secondary | ICD-10-CM

## 2015-11-24 DIAGNOSIS — Y848 Other medical procedures as the cause of abnormal reaction of the patient, or of later complication, without mention of misadventure at the time of the procedure: Secondary | ICD-10-CM | POA: Diagnosis not present

## 2015-11-24 DIAGNOSIS — E870 Hyperosmolality and hypernatremia: Secondary | ICD-10-CM | POA: Diagnosis not present

## 2015-11-24 DIAGNOSIS — K567 Ileus, unspecified: Secondary | ICD-10-CM | POA: Diagnosis not present

## 2015-11-24 DIAGNOSIS — G9340 Encephalopathy, unspecified: Secondary | ICD-10-CM

## 2015-11-24 DIAGNOSIS — A419 Sepsis, unspecified organism: Secondary | ICD-10-CM | POA: Diagnosis not present

## 2015-11-24 DIAGNOSIS — Z79891 Long term (current) use of opiate analgesic: Secondary | ICD-10-CM

## 2015-11-24 DIAGNOSIS — Z9049 Acquired absence of other specified parts of digestive tract: Secondary | ICD-10-CM

## 2015-11-24 DIAGNOSIS — Z888 Allergy status to other drugs, medicaments and biological substances status: Secondary | ICD-10-CM

## 2015-11-24 DIAGNOSIS — T85528A Displacement of other gastrointestinal prosthetic devices, implants and grafts, initial encounter: Secondary | ICD-10-CM

## 2015-11-24 DIAGNOSIS — K289 Gastrojejunal ulcer, unspecified as acute or chronic, without hemorrhage or perforation: Secondary | ICD-10-CM | POA: Diagnosis present

## 2015-11-24 DIAGNOSIS — K219 Gastro-esophageal reflux disease without esophagitis: Secondary | ICD-10-CM | POA: Diagnosis present

## 2015-11-24 DIAGNOSIS — K9189 Other postprocedural complications and disorders of digestive system: Secondary | ICD-10-CM

## 2015-11-24 DIAGNOSIS — F05 Delirium due to known physiological condition: Secondary | ICD-10-CM | POA: Diagnosis not present

## 2015-11-24 DIAGNOSIS — I471 Supraventricular tachycardia: Secondary | ICD-10-CM | POA: Diagnosis not present

## 2015-11-24 DIAGNOSIS — E43 Unspecified severe protein-calorie malnutrition: Secondary | ICD-10-CM | POA: Diagnosis present

## 2015-11-24 DIAGNOSIS — Z903 Acquired absence of stomach [part of]: Secondary | ICD-10-CM

## 2015-11-24 DIAGNOSIS — E440 Moderate protein-calorie malnutrition: Secondary | ICD-10-CM

## 2015-11-24 DIAGNOSIS — J9600 Acute respiratory failure, unspecified whether with hypoxia or hypercapnia: Secondary | ICD-10-CM

## 2015-11-24 DIAGNOSIS — T814XXA Infection following a procedure, initial encounter: Secondary | ICD-10-CM | POA: Diagnosis not present

## 2015-11-24 DIAGNOSIS — F411 Generalized anxiety disorder: Secondary | ICD-10-CM | POA: Diagnosis present

## 2015-11-24 DIAGNOSIS — K2960 Other gastritis without bleeding: Secondary | ICD-10-CM | POA: Diagnosis present

## 2015-11-24 DIAGNOSIS — Z9289 Personal history of other medical treatment: Secondary | ICD-10-CM

## 2015-11-24 DIAGNOSIS — T801XXA Vascular complications following infusion, transfusion and therapeutic injection, initial encounter: Secondary | ICD-10-CM | POA: Diagnosis not present

## 2015-11-24 DIAGNOSIS — K921 Melena: Secondary | ICD-10-CM | POA: Diagnosis not present

## 2015-11-24 DIAGNOSIS — L89310 Pressure ulcer of right buttock, unstageable: Secondary | ICD-10-CM | POA: Insufficient documentation

## 2015-11-24 DIAGNOSIS — E861 Hypovolemia: Secondary | ICD-10-CM | POA: Diagnosis not present

## 2015-11-24 DIAGNOSIS — Y838 Other surgical procedures as the cause of abnormal reaction of the patient, or of later complication, without mention of misadventure at the time of the procedure: Secondary | ICD-10-CM | POA: Diagnosis not present

## 2015-11-24 DIAGNOSIS — K5900 Constipation, unspecified: Secondary | ICD-10-CM

## 2015-11-24 DIAGNOSIS — J9621 Acute and chronic respiratory failure with hypoxia: Secondary | ICD-10-CM | POA: Diagnosis not present

## 2015-11-24 DIAGNOSIS — J9601 Acute respiratory failure with hypoxia: Secondary | ICD-10-CM

## 2015-11-24 DIAGNOSIS — R1312 Dysphagia, oropharyngeal phase: Secondary | ICD-10-CM

## 2015-11-24 DIAGNOSIS — K269 Duodenal ulcer, unspecified as acute or chronic, without hemorrhage or perforation: Secondary | ICD-10-CM | POA: Diagnosis present

## 2015-11-24 DIAGNOSIS — Z931 Gastrostomy status: Secondary | ICD-10-CM

## 2015-11-24 DIAGNOSIS — K2210 Ulcer of esophagus without bleeding: Secondary | ICD-10-CM | POA: Diagnosis present

## 2015-11-24 DIAGNOSIS — E2740 Unspecified adrenocortical insufficiency: Secondary | ICD-10-CM | POA: Diagnosis not present

## 2015-11-24 DIAGNOSIS — K6389 Other specified diseases of intestine: Secondary | ICD-10-CM | POA: Diagnosis not present

## 2015-11-24 HISTORY — DX: Adult hypertrophic pyloric stenosis: K31.1

## 2015-11-24 HISTORY — DX: Vitreous degeneration, unspecified eye: H43.819

## 2015-11-24 LAB — CBC
HCT: 36.4 % — ABNORMAL LOW (ref 39.0–52.0)
Hemoglobin: 12.8 g/dL — ABNORMAL LOW (ref 13.0–17.0)
MCH: 30.9 pg (ref 26.0–34.0)
MCHC: 35.2 g/dL (ref 30.0–36.0)
MCV: 87.9 fL (ref 78.0–100.0)
PLATELETS: 304 10*3/uL (ref 150–400)
RBC: 4.14 MIL/uL — ABNORMAL LOW (ref 4.22–5.81)
RDW: 12.5 % (ref 11.5–15.5)
WBC: 6.2 10*3/uL (ref 4.0–10.5)

## 2015-11-24 LAB — CREATININE, SERUM
Creatinine, Ser: 0.75 mg/dL (ref 0.61–1.24)
GFR calc non Af Amer: >60 mL/min (ref >60)

## 2015-11-24 MED ORDER — PANTOPRAZOLE SODIUM 40 MG IV SOLR: 40.0000 mg | Freq: Two times a day (BID) | INTRAVENOUS | Status: DC

## 2015-11-24 MED ORDER — HYDROMORPHONE HCL 1 MG/ML IJ SOLN: 0.5000 mg | INTRAMUSCULAR | Status: DC | PRN

## 2015-11-24 MED ORDER — ALUM & MAG HYDROXIDE-SIMETH 200-200-20 MG/5ML PO SUSP: 30.0000 mL | Freq: Four times a day (QID) | ORAL | Status: DC | PRN

## 2015-11-24 MED ORDER — ONDANSETRON 4 MG PO TBDP: 4.0000 mg | ORAL_TABLET | Freq: Four times a day (QID) | ORAL | Status: DC | PRN

## 2015-11-24 MED ORDER — PANTOPRAZOLE SODIUM 40 MG IV SOLR
80.0000 mg | Freq: Once | INTRAVENOUS | Status: AC
Start: 2015-11-24 — End: 2015-11-24
  Administered 2015-11-24: 80 mg via INTRAVENOUS
  Filled 2015-11-24: qty 80

## 2015-11-24 MED ORDER — ENOXAPARIN SODIUM 40 MG/0.4ML ~~LOC~~ SOLN
40.0000 mg | SUBCUTANEOUS | Status: DC
  Administered 2015-11-24 – 2015-12-02 (×9): 40 mg via SUBCUTANEOUS
  Filled 2015-11-24 (×10): qty 0.4

## 2015-11-24 MED ORDER — FAMOTIDINE IN NACL 20-0.9 MG/50ML-% IV SOLN
20.0000 mg | Freq: Two times a day (BID) | INTRAVENOUS | Status: DC
  Administered 2015-11-24 – 2015-12-01 (×14): 20 mg via INTRAVENOUS
  Filled 2015-11-24 (×14): qty 50

## 2015-11-24 MED ORDER — LIP MEDEX EX OINT: 1.0000 "application " | TOPICAL_OINTMENT | Freq: Two times a day (BID) | CUTANEOUS | Status: DC

## 2015-11-24 MED ORDER — DIPHENHYDRAMINE HCL 50 MG/ML IJ SOLN
12.5000 mg | Freq: Three times a day (TID) | INTRAMUSCULAR | Status: DC | PRN
  Administered 2015-11-24 – 2015-11-27 (×4): 12.5 mg via INTRAVENOUS
  Filled 2015-11-24 (×4): qty 1

## 2015-11-24 MED ORDER — DIPHENHYDRAMINE HCL 50 MG/ML IJ SOLN
12.5000 mg | Freq: Four times a day (QID) | INTRAMUSCULAR | Status: DC | PRN
  Filled 2015-11-24: qty 1

## 2015-11-24 MED ORDER — SIMETHICONE 80 MG PO CHEW: 40.0000 mg | CHEWABLE_TABLET | Freq: Four times a day (QID) | ORAL | Status: DC | PRN

## 2015-11-24 MED ORDER — CHLORHEXIDINE GLUCONATE 0.12 % MT SOLN
15.0000 mL | Freq: Two times a day (BID) | OROMUCOSAL | Status: DC
  Administered 2015-11-24 – 2015-12-06 (×21): 15 mL via OROMUCOSAL
  Filled 2015-11-24 (×17): qty 15

## 2015-11-24 MED ORDER — METOPROLOL TARTRATE 5 MG/5ML IV SOLN: 5.0000 mg | Freq: Four times a day (QID) | INTRAVENOUS | Status: DC | PRN

## 2015-11-24 MED ORDER — MAGIC MOUTHWASH: 15.0000 mL | Freq: Four times a day (QID) | ORAL | Status: DC | PRN

## 2015-11-24 MED ORDER — SODIUM CHLORIDE 0.9 % IV SOLN
INTRAVENOUS | Status: DC
  Administered 2015-11-24 – 2015-11-25 (×2): via INTRAVENOUS
  Filled 2015-11-24 (×10): qty 1000

## 2015-11-24 MED ORDER — PHENOL 1.4 % MT LIQD
1.0000 | OROMUCOSAL | Status: DC | PRN
  Filled 2015-11-24: qty 177

## 2015-11-24 MED ORDER — ACETAMINOPHEN 650 MG RE SUPP: 650.0000 mg | Freq: Four times a day (QID) | RECTAL | Status: DC | PRN

## 2015-11-24 MED ORDER — LACTATED RINGERS IV SOLN: INTRAVENOUS | Status: DC

## 2015-11-24 MED ORDER — METOCLOPRAMIDE HCL 5 MG/ML IJ SOLN
5.0000 mg | Freq: Three times a day (TID) | INTRAMUSCULAR | Status: DC
  Administered 2015-11-24 – 2015-11-25 (×2): 5 mg via INTRAVENOUS
  Filled 2015-11-24 (×2): qty 2

## 2015-11-24 MED ORDER — ENOXAPARIN SODIUM 150 MG/ML ~~LOC~~ SOLN: 40.0000 mg | SUBCUTANEOUS | Status: DC

## 2015-11-24 MED ORDER — OXYCODONE HCL 5 MG PO TABS: 5.0000 mg | ORAL_TABLET | ORAL | Status: DC | PRN

## 2015-11-24 MED ORDER — METOCLOPRAMIDE HCL 5 MG/ML IJ SOLN: 10.0000 mg | Freq: Four times a day (QID) | INTRAVENOUS | Status: DC

## 2015-11-24 MED ORDER — HYDROMORPHONE HCL 2 MG/ML IJ SOLN: 0.5000 mg | INTRAMUSCULAR | Status: DC | PRN

## 2015-11-24 MED ORDER — ORAL CARE MOUTH RINSE
15.0000 mL | Freq: Two times a day (BID) | OROMUCOSAL | Status: DC
  Administered 2015-11-25 – 2015-12-06 (×18): 15 mL via OROMUCOSAL

## 2015-11-24 MED ORDER — DIPHENHYDRAMINE HCL 50 MG/ML IJ SOLN: 12.5000 mg | Freq: Four times a day (QID) | INTRAMUSCULAR | Status: DC | PRN

## 2015-11-24 MED ORDER — LORAZEPAM 2 MG/ML IJ SOLN: 0.5000 mg | Freq: Three times a day (TID) | INTRAMUSCULAR | Status: DC | PRN

## 2015-11-24 MED ORDER — PHENOL 1.4 % MT LIQD: 2.0000 | OROMUCOSAL | Status: DC | PRN

## 2015-11-24 MED ORDER — SODIUM CHLORIDE 0.9 % IV SOLN
8.0000 mg/h | INTRAVENOUS | Status: AC
  Administered 2015-11-24 – 2015-11-25 (×2): 8 mg/h via INTRAVENOUS
  Filled 2015-11-24 (×8): qty 80

## 2015-11-24 MED ORDER — HYDRALAZINE HCL 20 MG/ML IJ SOLN: 10.0000 mg | INTRAMUSCULAR | Status: DC | PRN

## 2015-11-24 MED ORDER — SODIUM CHLORIDE 0.9 % IV SOLN: 4.0000 mg | Freq: Four times a day (QID) | INTRAVENOUS | Status: DC | PRN

## 2015-11-24 MED ORDER — LACTATED RINGERS IV BOLUS (SEPSIS): 1000.0000 mL | Freq: Once | INTRAVENOUS | Status: DC

## 2015-11-24 MED ORDER — SODIUM CHLORIDE 0.9 % IV SOLN: 8.0000 mg | Freq: Four times a day (QID) | INTRAVENOUS | Status: DC | PRN

## 2015-11-24 MED ORDER — ONDANSETRON HCL 4 MG/2ML IJ SOLN
4.0000 mg | Freq: Four times a day (QID) | INTRAMUSCULAR | Status: DC | PRN
  Administered 2015-12-01 – 2015-12-13 (×2): 4 mg via INTRAVENOUS
  Filled 2015-11-24 (×2): qty 2

## 2015-11-24 MED ORDER — BISACODYL 10 MG RE SUPP: 10.0000 mg | Freq: Two times a day (BID) | RECTAL | Status: DC | PRN

## 2015-11-24 MED ORDER — SODIUM CHLORIDE 0.9 % IV SOLN: 20.0000 mg | Freq: Two times a day (BID) | INTRAVENOUS | Status: DC

## 2015-11-24 MED ORDER — DIPHENHYDRAMINE HCL 12.5 MG/5ML PO ELIX: 12.5000 mg | ORAL_SOLUTION | Freq: Four times a day (QID) | ORAL | Status: DC | PRN

## 2015-11-24 MED ORDER — MENTHOL 3 MG MT LOZG: 1.0000 | LOZENGE | OROMUCOSAL | Status: DC | PRN

## 2015-11-24 MED ORDER — LACTATED RINGERS IV BOLUS (SEPSIS): 1000.0000 mL | Freq: Three times a day (TID) | INTRAVENOUS | Status: AC | PRN

## 2015-11-24 MED ORDER — ACETAMINOPHEN 325 MG PO TABS: 650.0000 mg | ORAL_TABLET | Freq: Four times a day (QID) | ORAL | Status: DC | PRN

## 2015-11-24 NOTE — H&P (Signed)
[name] 11/24/2015 9:22 AM Location: [facility] Patient #: [id number] DOB: [birth year] Married / Language: English / Race: White Male  Patient Care Team: Danny FunkJohn Griffin, MD as PCP - General (Internal Medicine) Danny SodaSteven Ashante Snelling, MD as Consulting Physician (General Surgery) Danny ModenaWilliam Outlaw, MD as Consulting Physician (Gastroenterology)   History of Present Illness [name](Danny Tenbrink C. Antania Hoefling MD; 11/24/2015 12:30 PM) The patient is a 75 year old male presenting for a post-operative visit. Note for "Post-Operative": Patient returns one month status post robotic resection. Distal gastrectomy with Billroth I gastroduodenal reconstruction for chronic ulcer causing obstruction. 09/25/2015  Patient comes in today with his wife & daughter  Apparently he has has worsening symptoms since I saw him a month ago. More bloating. Back to liquids. Some heartburn. Not throwing up. Discussed with primary care physician. CT scan done that showed dilated stomach and suspicion for recurrent stricture. Upper GI was ordered. Radiology could not do it due to suspicion of retained food. Was sent to the emergency room. Family recalls being told that he was going to get the stomach pumped. ED called gastroenterology. Danny [name] offered to admit and do EGD the next morning. The patient did not want to stay and went home. Followed up with gastroenterology in the next week. Set up for EGD. That was done yesterday. Moderate volume of retained food. Danny. Dulce [name] off got down to what he thought was a gastroduodenal anastomosis & it seemed inflamed & strictured. He thought maybe he saw stitch. Did not want to do anything else.  The first time I heard about all these issues over the past month was called by his gastroenterologist yesterday after the EGD. I called [name] a couple hours later and we talked. Patient comes in today for 1 month follow-up. Patient denies much with abdominal pain. Daughter  concerned that he's lost about 6 pounds in the past couple weeks. He claims he is not lightheaded or dizzy. Urinating regularly. Does get full after eating some solid food. He's apparently been on liquids for over a week. Denies any aspiration events or difficulty with breathing. He claims he is passing gas and moving his bowels. His wife is pretty certain he is taking his Protonix BID and other medications rather regularly.      PATIENT: [name] 75 y.o. male  Patient Care Team: Danny FunkJohn Griffin, MD as PCP - General (Internal Medicine) Danny SodaSteven Joshue Badal, MD as Consulting Physician (General Surgery) Danny ModenaWilliam Outlaw, MD as Consulting Physician (Gastroenterology)  PRE-OPERATIVE DIAGNOSIS: Partial gastric outlet obstruction due to chronic pyloric stricture  POST-OPERATIVE DIAGNOSIS: Partial gastric outlet obstruction due to chronic pyloric stricture  PROCEDURE:  XI ROBOTIC distal gastrectomy BILROTH I ANASTOMOSIS ANTERIOR AND POSTERIOR VAGOTOMY DOR (Anterior 180 degree) FUNDIPLICATION OMENTOPEXY UPPER ENDOSCOPY  SURGEON: Surgeon(s): Danny SodaSteven Gohan Collister, MD Danny LeveeAlicia Thomas, MD - Assist  OR FINDINGS:  Patient had thickened pylorus at the gastroduodenal junction consistent with stricture. Dilated and enlarged stomach. No retained food though.  No obvious metastatic disease on visceral parietal peritoneum or liver.  It is a handsewn gastroduodenal end-to-end (= Bilroth I) anastomosis that rests in the RUQ region.  Diagnosis 1. Vagus nerve, left anterior - PERIPHERAL NERVE. 2. Vagus nerve, left posterior - PERIPHERAL NERVE 3. Stomach, resection, antrum duodenal bulb with pyloric stricture - BENIGN INFLAMED GASTRIC TYPE MUCOSA AND SMALL BOWEL TYPE MUCOSA WITH STRICTURE. - THERE IS NO EVIDENCE OF MALIGNANCY. - SEE COMMENT. Microscopic Comment 3. Warthin Starry stains performed on multiple blocks are negative for the presence of  Helicobacter pylori organisms. Pecola Leisure  MD Pathologist, Electronic Signature (Case signed 09/29/2015) Specimen [name] and Clinical Information Specimen(s) Obtained: 1. Vagus nerve, left anterior 2. Vagus nerve, left posterior 3. Stomach, resection, antrum duodenal bulb with pyloric stricture Specimen Clinical Information 1. partial gastric outlet obstruction due to chronic pyloric stricture [rd] [name] 1. Received fresh is a 4 cm in length x 0.2 cm in diameter tubular portion of glistening, pink-purple tissue. Representative sections are submitted in one block. 2. Received fresh is a 4.0 cm in length x 0.2 cm in diameter portion of glistening pink-purple tubular tissue with an unremarkable cut surface. Representative cross sections are submitted in one block. 1 of 2 FINAL for [name] [phone]) [name](continued) 3. Received in formalin is a 11.0 x 5.5 x 2.0 cm portion of intact stomach with two stapled margins 3.5 cm and 8.5 cm in length. The serosa is glistening, tan-pink and smooth. Opening reveals a narrowed lumen, approximately 0.3 cm in diameter and an area with a 1.8 x 1.5 cm bulging, puckered mucosa. This area is located 1.5 cm from the smaller stapled margin. The remaining mucosa is flattened, glistening, pink-purple and unremarkable. Sectioning of the puckered area displays a thickened wall up to 0.5 cm. The mucosa is possibly red, hyperemic and granular. Block summary: A= smaller stapled margin. B= representative larger stapled margin. C-G= representative puckered area. H= uninvolved mucosa. 8 blocks total. (KF:gt,09/28/15) Stain(s) used in Diagnosis: The following stain(s) were used in diagnosing the case: Warthin-Starry Stain. The control(s) stained appropriately. Report signed out from the following location(s) Technical component and interpretation was performed at [facility] [address] [place], [place], [place] [address]. CLIA #: [id number], 2 of   Problem List/Past Medical Danny Sportsman, MD; 11/24/2015 9:48 AM) PYLORIC STRICTURE (K31.1) Dyspnea (R06.00)10/01/2015  Other Problems Danny Sportsman, MD; 11/24/2015 9:48 AM) Back Pain Enlarged Prostate Gastroesophageal Reflux Disease High blood pressure Hypercholesterolemia  Past Surgical History Danny Sportsman, MD; 11/24/2015 9:48 AM) Gallbladder Surgery - Laparoscopic Oral Surgery  Diagnostic Studies History Danny Sportsman, MD; 11/24/2015 9:48 AM) Colonoscopy 1-5 years ago  Allergies Fay Records, CMA; 11/24/2015 9:24 AM) Flomax *GENITOURINARY AGENTS - MISCELLANEOUS* No Known Drug Allergies10/11/2015 (Marked as Inactive) Lisinopril *CHEMICALS* Cough. Uroxatral *GENITOURINARY AGENTS - MISCELLANEOUS*  Medication History Fay Records, CMA; 11/24/2015 9:25 AM) ALPRAZolam (0.5MG  Tablet, Oral) Active. Baby Aspirin (81MG  Tablet Chewable, Oral) Active. Atorvastatin Calcium (40MG  Tablet, Oral) Active. Metoclopramide HCl (5MG  Tablet, Oral) Active. Multivitamins (Oral) Active. Nitrostat (0.4MG  Tab Sublingual, Sublingual as needed) Active. Pantoprazole Sodium (40MG  Tablet Danny, Oral) Active. Restasis (0.05% Emulsion, Ophthalmic) Active. TraMADol HCl (50MG  Tablet, Oral) Active. Valsartan (80MG  Tablet, Oral) Active. Stool Softener (100MG  Capsule, Oral) Active. Medications Reconciled  Social History Danny Sportsman, MD; 11/24/2015 9:48 AM) Alcohol use Occasional alcohol use. Caffeine use Carbonated beverages, Coffee, Tea. No drug use Tobacco use Former smoker.  Family History Danny Sportsman, MD; 11/24/2015 9:48 AM) Cancer Sister. Cerebrovascular Accident Mother. Diabetes Mellitus Brother, Mother, Sister. Heart Disease Brother, Mother, Sister. Hypertension Brother, Mother, Sister. Melanoma Brother. Migraine Headache Mother, Sister. Respiratory Condition Father.  Vitals Fay Records CMA; 11/24/2015 9:25 AM) 11/24/2015 9:25 AM Weight: 182 lb Height: 70in Body  Surface Area: 2.01 m Body Mass Index: 26.11 kg/m  Temp.: 97.57F(Temporal)  Pulse: 74 (Regular)  BP: 126/78 (Sitting, Left Arm, Standard)       Physical Exam Danny Sportsman MD; 11/24/2015 10:59 AM) General Mental Status-Alert. General Appearance-Not in acute distress. Voice-Normal. Note: Relaxed. Nontoxic.  Inquisitive. Not sickly.   Integumentary Global Assessment Normal Exam - Distribution of scalp and body hair is normal. General Characteristics Overall Skin Surface - no rashes and no suspicious lesions.  Head and Neck Head-normocephalic, atraumatic with no lesions or palpable masses. Face Global Assessment - atraumatic, no absence of expression. Neck Global Assessment - no abnormal movements, no decreased range of motion. Trachea-midline. Thyroid Gland Characteristics - non-tender.  Eye Eyeball - Left-Extraocular movements intact, No Nystagmus. Eyeball - Right-Extraocular movements intact, No Nystagmus. Upper Eyelid - Left-No Cyanotic. Upper Eyelid - Right-No Cyanotic. Note: Wears glasses. Vision acceptable   ENMT Note: Again speech is mildly slurred. Perhaps little more than last time.   Chest and Lung Exam Inspection Accessory muscles - No use of accessory muscles in breathing.  Abdomen Note: Left upper abdomen very distended. Its like he's got a Beach ball underneath there. Incisions with normal healing ridges. No cellulitis. No guarding/rebound tenderness   Peripheral Vascular Upper Extremity Inspection - Left - Not Gangrenous, No Petechiae. Right - Not Gangrenous, No Petechiae.  Neurologic Neurologic evaluation reveals -normal attention span and ability to concentrate, able to name objects and repeat phrases. Appropriate fund of knowledge and normal coordination.  Neuropsychiatric Mental status exam performed with findings of-able to articulate well with normal speech/language, rate, volume and coherence  and no evidence of hallucinations, delusions, obsessions or homicidal/suicidal ideation. Orientation-oriented X3.  Musculoskeletal Global Assessment Gait and Station - normal gait and station.  Lymphatic General Lymphatics Description - No Generalized lymphadenopathy.    Assessment & Plan Danny Sportsman MD; 11/24/2015 11:03 AM) PYLORIC STRICTURE (K31.1) Impression: Two months out status post robotic distal gastrectomy with Billroth I gastroduodenal in-line reconstruction.  Again with gastric outlet obstruction with massively dilated stomach. EGD yesterday, suspicious for evidence of tightening at the anastomosis. Suspicious for recurrent ulcer. Other possibility is partially stitched anastomosis, although he seemed to have good by mouth tolerance to first few weeks after surgery. Certainly not distended as when I saw him month ago.  I suspect at least some of this is from gastroparesis and gastric atony, but I am doubtful that explains everything  A long discussion with the patient. He was hoping to just take some pills and turned this around. I think he is failed outpatient management. I think he needs to get admitted since his stomach is massively dilated so that we can more aggressively correct this. His daughter strongly agrees. His wife mostly agrees as well. Initially did not want to do that, but seemed to relent when I explained my concern that he could literally starve to death if this is truly obstructed.  NGT  We'll need IV proton pump inhibitors. Apparently on Sport and exercise psychologist. I called pharmacy arguing indications. They had no problem with overriding for this patient Add an H2 blocker.  Add IV Reglan to help stomach empty. Perhaps erythromycin.  Most likely will need to reattempt EGD to help remove retained food and probable balloon dilation of the anastomosis. See if that helps. Make sure that there is no suturing that is closing down the anastomosis.  If he truly has  strictured down again and refractory to all these interventions, may require Billroth II loop gastrojejunostomy. However very hesitant to do that until I know that we've rule out other etiologies second stricture down. Current Plans Pt Education - [facility] - General recommendations Pt Education - [facility] Good Bowel Health (Takuma Cifelli) Pt Education - [facility] Esophageal Surgery Diet HCI (Edee Nifong): discussed with patient and provided information.  [name], M.D., F.A.C.S. Gastrointestinal and Minimally Invasive Surgery [facility] [address], [place], [place] [address] [phone] Main / Paging

## 2015-11-24 NOTE — Progress Notes (Signed)
Paged Dr. Daphine DeutscherMartin per family request to get valium dose at bedtime. Typically takes akes xanax but pt has NGT and family says ativan "threw him out of his mind."   Daphine DeutscherMartin called Benadryl was ordered for patient.

## 2015-11-25 ENCOUNTER — Observation Stay (HOSPITAL_COMMUNITY): Payer: Medicare Other

## 2015-11-25 ENCOUNTER — Ambulatory Visit: Payer: Self-pay | Admitting: Neurology

## 2015-11-25 ENCOUNTER — Encounter (HOSPITAL_COMMUNITY): Payer: Self-pay | Admitting: Radiology

## 2015-11-25 DIAGNOSIS — Z930 Tracheostomy status: Secondary | ICD-10-CM | POA: Diagnosis not present

## 2015-11-25 DIAGNOSIS — Y848 Other medical procedures as the cause of abnormal reaction of the patient, or of later complication, without mention of misadventure at the time of the procedure: Secondary | ICD-10-CM | POA: Diagnosis not present

## 2015-11-25 DIAGNOSIS — E870 Hyperosmolality and hypernatremia: Secondary | ICD-10-CM | POA: Diagnosis not present

## 2015-11-25 DIAGNOSIS — M79609 Pain in unspecified limb: Secondary | ICD-10-CM | POA: Diagnosis not present

## 2015-11-25 DIAGNOSIS — F05 Delirium due to known physiological condition: Secondary | ICD-10-CM | POA: Diagnosis not present

## 2015-11-25 DIAGNOSIS — T814XXA Infection following a procedure, initial encounter: Secondary | ICD-10-CM | POA: Diagnosis not present

## 2015-11-25 DIAGNOSIS — I998 Other disorder of circulatory system: Secondary | ICD-10-CM | POA: Diagnosis not present

## 2015-11-25 DIAGNOSIS — E440 Moderate protein-calorie malnutrition: Secondary | ICD-10-CM

## 2015-11-25 DIAGNOSIS — K9189 Other postprocedural complications and disorders of digestive system: Secondary | ICD-10-CM

## 2015-11-25 DIAGNOSIS — K567 Ileus, unspecified: Secondary | ICD-10-CM | POA: Diagnosis not present

## 2015-11-25 DIAGNOSIS — K311 Adult hypertrophic pyloric stenosis: Secondary | ICD-10-CM | POA: Diagnosis present

## 2015-11-25 DIAGNOSIS — E2740 Unspecified adrenocortical insufficiency: Secondary | ICD-10-CM | POA: Diagnosis not present

## 2015-11-25 DIAGNOSIS — T801XXA Vascular complications following infusion, transfusion and therapeutic injection, initial encounter: Secondary | ICD-10-CM | POA: Diagnosis not present

## 2015-11-25 DIAGNOSIS — A419 Sepsis, unspecified organism: Secondary | ICD-10-CM | POA: Diagnosis not present

## 2015-11-25 DIAGNOSIS — R188 Other ascites: Secondary | ICD-10-CM | POA: Diagnosis not present

## 2015-11-25 DIAGNOSIS — I742 Embolism and thrombosis of arteries of the upper extremities: Secondary | ICD-10-CM | POA: Diagnosis not present

## 2015-11-25 DIAGNOSIS — I82621 Acute embolism and thrombosis of deep veins of right upper extremity: Secondary | ICD-10-CM | POA: Diagnosis not present

## 2015-11-25 DIAGNOSIS — K651 Peritoneal abscess: Secondary | ICD-10-CM | POA: Diagnosis not present

## 2015-11-25 DIAGNOSIS — J9601 Acute respiratory failure with hypoxia: Secondary | ICD-10-CM | POA: Diagnosis not present

## 2015-11-25 DIAGNOSIS — K2900 Acute gastritis without bleeding: Secondary | ICD-10-CM | POA: Diagnosis not present

## 2015-11-25 DIAGNOSIS — R6521 Severe sepsis with septic shock: Secondary | ICD-10-CM | POA: Diagnosis not present

## 2015-11-25 DIAGNOSIS — K913 Postprocedural intestinal obstruction, unspecified as to partial versus complete: Secondary | ICD-10-CM | POA: Diagnosis not present

## 2015-11-25 DIAGNOSIS — G9340 Encephalopathy, unspecified: Secondary | ICD-10-CM | POA: Diagnosis not present

## 2015-11-25 DIAGNOSIS — Z934 Other artificial openings of gastrointestinal tract status: Secondary | ICD-10-CM | POA: Diagnosis not present

## 2015-11-25 DIAGNOSIS — M79621 Pain in right upper arm: Secondary | ICD-10-CM | POA: Diagnosis not present

## 2015-11-25 DIAGNOSIS — K921 Melena: Secondary | ICD-10-CM | POA: Diagnosis not present

## 2015-11-25 DIAGNOSIS — N179 Acute kidney failure, unspecified: Secondary | ICD-10-CM | POA: Diagnosis not present

## 2015-11-25 DIAGNOSIS — E2749 Other adrenocortical insufficiency: Secondary | ICD-10-CM | POA: Diagnosis not present

## 2015-11-25 DIAGNOSIS — K269 Duodenal ulcer, unspecified as acute or chronic, without hemorrhage or perforation: Secondary | ICD-10-CM | POA: Diagnosis present

## 2015-11-25 DIAGNOSIS — G9341 Metabolic encephalopathy: Secondary | ICD-10-CM | POA: Diagnosis not present

## 2015-11-25 DIAGNOSIS — J988 Other specified respiratory disorders: Secondary | ICD-10-CM | POA: Diagnosis not present

## 2015-11-25 DIAGNOSIS — I471 Supraventricular tachycardia: Secondary | ICD-10-CM | POA: Diagnosis not present

## 2015-11-25 DIAGNOSIS — Y828 Other medical devices associated with adverse incidents: Secondary | ICD-10-CM | POA: Diagnosis not present

## 2015-11-25 DIAGNOSIS — D62 Acute posthemorrhagic anemia: Secondary | ICD-10-CM | POA: Diagnosis not present

## 2015-11-25 DIAGNOSIS — Z978 Presence of other specified devices: Secondary | ICD-10-CM | POA: Diagnosis not present

## 2015-11-25 DIAGNOSIS — E861 Hypovolemia: Secondary | ICD-10-CM | POA: Diagnosis not present

## 2015-11-25 DIAGNOSIS — Y92230 Patient room in hospital as the place of occurrence of the external cause: Secondary | ICD-10-CM | POA: Diagnosis not present

## 2015-11-25 DIAGNOSIS — K653 Choleperitonitis: Secondary | ICD-10-CM | POA: Diagnosis not present

## 2015-11-25 DIAGNOSIS — E43 Unspecified severe protein-calorie malnutrition: Secondary | ICD-10-CM | POA: Diagnosis present

## 2015-11-25 DIAGNOSIS — Y838 Other surgical procedures as the cause of abnormal reaction of the patient, or of later complication, without mention of misadventure at the time of the procedure: Secondary | ICD-10-CM | POA: Diagnosis not present

## 2015-11-25 DIAGNOSIS — K2210 Ulcer of esophagus without bleeding: Secondary | ICD-10-CM | POA: Diagnosis present

## 2015-11-25 DIAGNOSIS — J9621 Acute and chronic respiratory failure with hypoxia: Secondary | ICD-10-CM | POA: Diagnosis not present

## 2015-11-25 LAB — COMPREHENSIVE METABOLIC PANEL
ALK PHOS: 76 U/L (ref 38–126)
ALT: 16 U/L — AB (ref 17–63)
AST: 21 U/L (ref 15–41)
Albumin: 3.6 g/dL (ref 3.5–5.0)
Anion gap: 11 (ref 5–15)
BUN: 13 mg/dL (ref 6–20)
CHLORIDE: 105 mmol/L (ref 101–111)
CO2: 19 mmol/L — AB (ref 22–32)
CREATININE: 0.86 mg/dL (ref 0.61–1.24)
Calcium: 8.7 mg/dL — ABNORMAL LOW (ref 8.9–10.3)
GFR calc Af Amer: >60 mL/min (ref >60)
GFR calc non Af Amer: >60 mL/min (ref >60)
Glucose, Bld: 75 mg/dL (ref 65–99)
Potassium: 4.2 mmol/L (ref 3.5–5.1)
SODIUM: 135 mmol/L (ref 135–145)
Total Bilirubin: 1 mg/dL (ref 0.3–1.2)
Total Protein: 6.7 g/dL (ref 6.5–8.1)

## 2015-11-25 LAB — PREALBUMIN: Prealbumin: 17.8 mg/dL — ABNORMAL LOW (ref 18–38)

## 2015-11-25 MED ORDER — LACTATED RINGERS IV BOLUS (SEPSIS): 1000.0000 mL | Freq: Three times a day (TID) | INTRAVENOUS | Status: AC | PRN

## 2015-11-25 MED ORDER — ACETAMINOPHEN 650 MG RE SUPP
650.0000 mg | Freq: Four times a day (QID) | RECTAL | Status: DC | PRN
  Administered 2015-11-25: 650 mg via RECTAL
  Filled 2015-11-25: qty 1

## 2015-11-25 MED ORDER — METOCLOPRAMIDE HCL 5 MG/ML IJ SOLN
10.0000 mg | Freq: Three times a day (TID) | INTRAMUSCULAR | Status: DC
  Administered 2015-11-25 – 2015-12-01 (×17): 10 mg via INTRAVENOUS
  Filled 2015-11-25 (×18): qty 2

## 2015-11-25 MED ORDER — LACTATED RINGERS IV BOLUS (SEPSIS)
1000.0000 mL | Freq: Once | INTRAVENOUS | Status: AC
  Administered 2015-11-25: 1000 mL via INTRAVENOUS

## 2015-11-25 MED ORDER — BISACODYL 10 MG RE SUPP
10.0000 mg | Freq: Two times a day (BID) | RECTAL | Status: DC | PRN
  Administered 2015-11-25: 10 mg via RECTAL
  Filled 2015-11-25: qty 1

## 2015-11-25 MED ORDER — CARBOXYMETHYLCELLULOSE SODIUM 1 % OP SOLN: 2.0000 [drp] | Freq: Two times a day (BID) | OPHTHALMIC | Status: DC

## 2015-11-25 MED ORDER — CYCLOSPORINE 0.05 % OP EMUL
1.0000 [drp] | Freq: Two times a day (BID) | OPHTHALMIC | Status: DC
  Administered 2015-11-25 – 2015-12-29 (×66): 1 [drp] via OPHTHALMIC
  Filled 2015-11-25 (×74): qty 1

## 2015-11-25 MED ORDER — HYDRALAZINE HCL 20 MG/ML IJ SOLN: 5.0000 mg | Freq: Four times a day (QID) | INTRAMUSCULAR | Status: DC | PRN

## 2015-11-25 MED ORDER — LORAZEPAM 2 MG/ML IJ SOLN: 0.5000 mg | Freq: Three times a day (TID) | INTRAMUSCULAR | Status: DC | PRN

## 2015-11-25 MED ORDER — PROCHLORPERAZINE EDISYLATE 5 MG/ML IJ SOLN
5.0000 mg | INTRAMUSCULAR | Status: DC | PRN
Start: 2015-11-25 — End: 2015-12-28

## 2015-11-25 MED ORDER — NITROGLYCERIN 0.4 MG SL SUBL: 0.4000 mg | SUBLINGUAL_TABLET | SUBLINGUAL | Status: DC | PRN

## 2015-11-25 MED ORDER — HYDROMORPHONE HCL 1 MG/ML IJ SOLN
0.5000 mg | INTRAMUSCULAR | Status: DC | PRN
  Administered 2015-11-27 – 2015-12-02 (×4): 1 mg via INTRAVENOUS
  Administered 2015-12-02: 0.5 mg via INTRAVENOUS
  Administered 2015-12-02: 2 mg via INTRAVENOUS
  Administered 2015-12-02: 1 mg via INTRAVENOUS
  Administered 2015-12-02 (×3): 0.5 mg via INTRAVENOUS
  Administered 2015-12-03: 1 mg via INTRAVENOUS
  Filled 2015-11-25 (×2): qty 1
  Filled 2015-11-25: qty 2
  Filled 2015-11-25 (×9): qty 1

## 2015-11-25 MED ORDER — METOPROLOL TARTRATE 5 MG/5ML IV SOLN
5.0000 mg | Freq: Four times a day (QID) | INTRAVENOUS | Status: DC | PRN
  Administered 2015-12-02: 5 mg via INTRAVENOUS
  Filled 2015-11-25: qty 5

## 2015-11-25 MED ORDER — PANTOPRAZOLE SODIUM 40 MG IV SOLR: 40.0000 mg | Freq: Two times a day (BID) | INTRAVENOUS | Status: DC

## 2015-11-25 MED ORDER — POLYVINYL ALCOHOL 1.4 % OP SOLN
2.0000 [drp] | Freq: Two times a day (BID) | OPHTHALMIC | Status: DC
  Administered 2015-11-25 – 2015-12-30 (×60): 2 [drp] via OPHTHALMIC
  Filled 2015-11-25 (×4): qty 15

## 2015-11-25 MED ORDER — IOPAMIDOL (ISOVUE-300) INJECTION 61%
15.0000 mL | Freq: Once | INTRAVENOUS | Status: AC | PRN
  Administered 2015-11-25 – 2015-12-10 (×2): 15 mL via ORAL
  Filled 2015-11-25 (×2): qty 30

## 2015-11-25 MED ORDER — KCL IN DEXTROSE-NACL 40-5-0.45 MEQ/L-%-% IV SOLN
INTRAVENOUS | Status: AC
  Administered 2015-11-25: 12:00:00 via INTRAVENOUS
  Administered 2015-11-25: 1000 mL via INTRAVENOUS
  Administered 2015-11-29: 14:00:00 via INTRAVENOUS
  Filled 2015-11-25 (×5): qty 1000

## 2015-11-25 MED ORDER — IOPAMIDOL (ISOVUE-300) INJECTION 61%
100.0000 mL | Freq: Once | INTRAVENOUS | Status: AC | PRN
  Administered 2015-11-25: 100 mL via INTRAVENOUS

## 2015-11-25 NOTE — Progress Notes (Signed)
Initial Nutrition Assessment  DOCUMENTATION CODES:   Severe malnutrition in context of acute illness/injury  INTERVENTION:   -Diet advancement per surgery -Once diet is advanced, pt would like Boost Plus QID. -If patient expected to be NPO x 8 days, recommend nutrition support. -RD to continue to monitor for plan  NUTRITION DIAGNOSIS:   Inadequate oral intake related to inability to eat as evidenced by NPO status.  GOAL:   Patient will meet greater than or equal to 90% of their needs  MONITOR:   Diet advancement, Labs, Weight trends, I & O's  REASON FOR ASSESSMENT:   Malnutrition Screening Tool    ASSESSMENT:   75 year old male presenting for a post-operative visit. Note for "Post-Operative": Patient returns one month status post robotic resection.  Distal gastrectomy with Billroth I gastroduodenal reconstruction for chronic ulcer causing obstruction.  09/25/2015  Patient in room with wife at bedside. Pt reports tolerating diet including solids foods up until a week ago. Pt's wife states he has had decreased appetite since his gastrectomy surgery in August. Pt states for the past week he has consumed mainly liquids including 3 Boosts (720 kcal, 30g protein), broth and puddings. Pt currently NPO with NGT (output: 100 ml). Pt denies any nausea. Pt reports getting full very quickly and pt's wife would like a review of his diet prior to discharge.  Per chart review, pt has lost 14 lb since 9/28 ( 7% wt loss x 2 weeks, significant for time frame). Nutrition focused physical exam shows no sign of depletion of muscle mass or body fat.  Labs reviewed. Medications: Reglan IV every 8 hours, D5 and .45% NaCl w/ KCl infusion at 100 ml/hr -provides 408 kcal  Diet Order:  Diet NPO time specified Except for: Ice Chips  Skin:  Reviewed, no issues  Last BM:  10/9  Height:   Ht Readings from Last 1 Encounters:  11/24/15 6' (1.829 m)    Weight:   Wt Readings from Last 1 Encounters:   11/24/15 179 lb 8 oz (81.4 kg)    Ideal Body Weight:  80.9 kg  BMI:  Body mass index is 24.34 kg/m.  Estimated Nutritional Needs:   Kcal:  2250-2450  Protein:  110-120g  Fluid:  2.2-2.4L/day  EDUCATION NEEDS:   No education needs identified at this time  Tilda FrancoLindsey Yurani Fettes, MS, RD, LDN Pager: [phone] After Hours Pager: [phone]

## 2015-11-25 NOTE — Progress Notes (Signed)
[place]  [address] [place], [place] [address] Phone: [phone] FAX: [phone]   [name] [id number] 19-Sep-1940  CARE TEAM:  PCP: Irven Shelling, MD  Outpatient Care Team: Patient Care Team: Lavone Orn, MD as PCP - General (Internal Medicine) Michael Boston, MD as Consulting Physician (General Surgery) Arta Silence, MD as Consulting Physician (Gastroenterology)  Inpatient Treatment Team: Treatment Team: Attending Provider: Michael Boston, MD; Registered Nurse: Coralie Common, RN  Problem List:   Active Problems:   Pyloric stricture    Assessment/Plan:   Stomach dilation - distention improved; NG tube in place (continue to 10/13), flushed at 8am    PPI IV, H2 blocker, Reglan   Continue NPO  EGD - consult Dr. Paulita Fujita today for potential re-assessment VTE prophylaxis - SCDs Mobilize as tolerated to help recovery   Olene Floss, PA-S [facility]  11/25/2015   Subjective:  Patient sleeping upon arrival Denies abdominal pain, although does have mild headache onset this morning Slept through the night Been up to urinate twice without difficulty Tolerating NG tube but expressed desire for removal States "a little bit" of flatus   Objective:  Vital signs:  Vitals:   11/24/15 1745 11/24/15 2120 11/25/15 0118 11/25/15 0546  BP: (!) 153/56 (!) 160/56 99/81 (!) 152/47  Pulse: (!) 55 (!) 56 (!) 58 (!) 58  Resp: 16 16 16 16   Temp: 98.1 F (36.7 C) 97.8 F (36.6 C) 98 F (36.7 C) 98.3 F (36.8 C)  TempSrc: Oral Oral Oral Oral  SpO2: 96% 98% 97% 96%  Weight:      Height:        Last BM Date: 11/23/15  Intake/Output   Yesterday:  10/10 0701 - 10/11 0700 In: 1580 [I.V.:1230; IV Piggyback:350] Out: 1400 [Urine:550; Emesis/NG output:850] This shift:  No intake/output data recorded.  Bowel function:  Flatus: YES  BM:  No  Drain: (No drain)   Physical  Exam:  General: Pt awake/alert/oriented x4 in No acute distress Eyes: PERRL, normal EOM.  Sclera clear.  No icterus Neuro: CN II-XII intact w/o focal sensory/motor deficits. Lymph: No head/neck/groin lymphadenopathy Psych:  No delerium/psychosis/paranoia HENT: Normocephalic, Mucus membranes moist.  No thrush Neck: Supple, No tracheal deviation Chest: CTAB. No chest wall pain w good excursion CV:  Pulses intact.  Regular rhythm MS: Normal AROM mjr joints.  No obvious deformity Abdomen: Soft.  Nondistended.  Nontender.  No evidence of peritonitis.  Bowel sounds present x4 but diminished Ext:  SCDs BLE.  No mjr edema.  No cyanosis Skin: No petechiae / purpura  Results:   Labs: Results for orders placed or performed during the hospital encounter of 11/24/15 (from the past 48 hour(s))  CBC     Status: Abnormal   Collection Time: 11/24/15  3:49 PM  Result Value Ref Range   WBC 6.2 4.0 - 10.5 K/uL   RBC 4.14 (L) 4.22 - 5.81 MIL/uL   Hemoglobin 12.8 (L) 13.0 - 17.0 g/dL   HCT 36.4 (L) 39.0 - 52.0 %   MCV 87.9 78.0 - 100.0 fL   MCH 30.9 26.0 - 34.0 pg   MCHC 35.2 30.0 - 36.0 g/dL   RDW 12.5 11.5 - 15.5 %   Platelets 304 150 - 400 K/uL  Creatinine, serum     Status: None   Collection Time: 11/24/15  3:49 PM  Result Value Ref Range   Creatinine, Ser 0.75 0.61 - 1.24 mg/dL   GFR calc non Af  Amer >60 >60 mL/min   GFR calc Af Amer >60 >60 mL/min    Comment: (NOTE) The eGFR has been calculated using the CKD EPI equation. This calculation has not been validated in all clinical situations. eGFR's persistently <60 mL/min signify possible Chronic Kidney Disease.     Imaging / Studies: No results found.  Medications / Allergies: per chart  Antibiotics: Anti-infectives    None        Note: Portions of this report may have been transcribed using voice recognition software. Every effort was made to ensure accuracy; however, inadvertent computerized transcription errors may be  present.   Any transcriptional errors that result from this process are unintentional.  Olene Floss, [place]   11/25/2015

## 2015-11-25 NOTE — Progress Notes (Signed)
Patient NG tube advanced per Dr Michaell CowingGross verbal order post CT.  Patient tolerated without difficulty.  Patient had small BM after receiving suppository.  Resting comfortably at this time with wife at bedside

## 2015-11-26 LAB — BASIC METABOLIC PANEL
ANION GAP: 5 (ref 5–15)
BUN: 8 mg/dL (ref 6–20)
CHLORIDE: 107 mmol/L (ref 101–111)
CO2: 23 mmol/L (ref 22–32)
CREATININE: 0.73 mg/dL (ref 0.61–1.24)
Calcium: 8.6 mg/dL — ABNORMAL LOW (ref 8.9–10.3)
GFR calc non Af Amer: >60 mL/min (ref >60)
Glucose, Bld: 125 mg/dL — ABNORMAL HIGH (ref 65–99)
Potassium: 4.1 mmol/L (ref 3.5–5.1)
Sodium: 135 mmol/L (ref 135–145)

## 2015-11-26 LAB — MAGNESIUM: MAGNESIUM: 1.8 mg/dL (ref 1.7–2.4)

## 2015-11-26 LAB — GLUCOSE, CAPILLARY
GLUCOSE-CAPILLARY: 162 mg/dL — AB (ref 65–99)
Glucose-Capillary: 129 mg/dL — ABNORMAL HIGH (ref 65–99)
Glucose-Capillary: 153 mg/dL — ABNORMAL HIGH (ref 65–99)

## 2015-11-26 LAB — PHOSPHORUS: Phosphorus: 2.3 mg/dL — ABNORMAL LOW (ref 2.5–4.6)

## 2015-11-26 MED ORDER — TRACE MINERALS CR-CU-MN-SE-ZN 10-1000-500-60 MCG/ML IV SOLN
INTRAVENOUS | Status: AC
  Administered 2015-11-26: 18:00:00 via INTRAVENOUS
  Filled 2015-11-26: qty 960

## 2015-11-26 MED ORDER — SODIUM CHLORIDE 0.9 % IV SOLN
250.0000 mg | Freq: Four times a day (QID) | INTRAVENOUS | Status: DC
  Administered 2015-11-26 – 2015-11-27 (×5): 250 mg via INTRAVENOUS
  Filled 2015-11-26 (×6): qty 5

## 2015-11-26 MED ORDER — SODIUM PHOSPHATES 45 MMOLE/15ML IV SOLN
10.0000 mmol | Freq: Once | INTRAVENOUS | Status: AC
  Administered 2015-11-26: 10 mmol via INTRAVENOUS
  Filled 2015-11-26: qty 3.33

## 2015-11-26 MED ORDER — DIAZEPAM 5 MG/ML IJ SOLN
5.0000 mg | Freq: Three times a day (TID) | INTRAMUSCULAR | Status: DC | PRN
  Administered 2015-11-28: 5 mg via INTRAVENOUS
  Administered 2015-11-29 – 2015-11-30 (×2): 10 mg via INTRAVENOUS
  Administered 2015-12-01 – 2015-12-02 (×2): 5 mg via INTRAVENOUS
  Administered 2015-12-03: 10 mg via INTRAVENOUS
  Filled 2015-11-26 (×7): qty 2

## 2015-11-26 MED ORDER — FAT EMULSION 20 % IV EMUL
240.0000 mL | INTRAVENOUS | Status: AC
  Administered 2015-11-26: 240 mL via INTRAVENOUS
  Filled 2015-11-26: qty 240

## 2015-11-26 MED ORDER — SODIUM CHLORIDE 0.9% FLUSH
10.0000 mL | INTRAVENOUS | Status: DC | PRN
  Administered 2015-11-29: 10 mL
  Administered 2015-11-30: 20 mL
  Administered 2015-12-01: 10 mL
  Filled 2015-11-26 (×3): qty 40

## 2015-11-26 MED ORDER — INSULIN ASPART 100 UNIT/ML ~~LOC~~ SOLN
0.0000 [IU] | SUBCUTANEOUS | Status: DC
  Administered 2015-11-27: 2 [IU] via SUBCUTANEOUS
  Administered 2015-11-27 – 2015-11-29 (×8): 1 [IU] via SUBCUTANEOUS

## 2015-11-26 NOTE — Progress Notes (Signed)
Peripherally Inserted Central Catheter/Midline Placement  The IV Nurse has discussed with the patient and/or persons authorized to consent for the patient, the purpose of this procedure and the potential benefits and risks involved with this procedure.  The benefits include less needle sticks, lab draws from the catheter, and the patient may be discharged home with the catheter. Risks include, but not limited to, infection, bleeding, blood clot (thrombus formation), and puncture of an artery; nerve damage and irregular heartbeat and possibility to perform a PICC exchange if needed/ordered by physician.  Alternatives to this procedure were also discussed.  Bard Power PICC patient education guide, fact sheet on infection prevention and patient information card has been provided to patient /or left at bedside.    PICC/Midline Placement Documentation        [name], [name] 11/26/2015, 11:07 AM

## 2015-11-26 NOTE — Progress Notes (Signed)
[name] [place], [place] [address] Phone: [phone] FAX: [phone]   [name] [id number] [birth year]  CARE TEAM:  PCP: Danny Shelling, MD  Outpatient Care Team: Patient Care Team: Danny Orn, MD as PCP - General (Internal Medicine) Danny Boston, MD as Consulting Physician (General Surgery) Danny Silence, MD as Consulting Physician (Gastroenterology)  Inpatient Treatment Team: Treatment Team: Attending Provider: Michael Boston, MD; Consulting Physician: Danny Silence, MD  Problem List:   Principal Problem:   Gastric Ileus  Active Problems:   Essential hypertension, benign   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 09/25/2015   Anxiety state   Protein-calorie malnutrition, moderate ([place])   Pyloric stricture     Assessment/Plan:              Stomach dilation - distention improved; NG tube in place, flushed                          Continue IV fluids, PPI, H2 blocker, Reglan                         Continue NPO - Nutrition assessment yesterday  CT Abdomen 10/11 - Significant stomach distention with layering fluid and gas, appearance concerning for recurrent gastric outlet obstruction             EGD with probable balloon dilation - consider if not opened up by next week, likely done by Dr. Johney [name]   In contact with Dr. Paulita [name] and care team on current patient status VTE prophylaxis - SCDs Mobilize as tolerated to help recovery   Danny Floss, PA-S [facility]  11/26/2015   Subjective:  Denies abdominal pain or discomfort (headache from yesterday resolved) Urinating without difficulty Tolerating NG tube States flatus BM last night after receiving suppository, admits mild discomfort with defecation likely secondary to preceding 2-day constipation  Objective:  Vital signs:  Vitals:   11/25/15 1008 11/25/15 1407 11/25/15 2158 11/26/15 0533  BP: (!)  120/48 140/65 (!) 153/59 (!) 158/60  Pulse: 61 74 68 (!) 58  Resp: _0 Temp: 98.6 F (37 C) 98.1 F (36.7 C) 98.2 F (36.8 C) 98.2 F (36.8 C)  TempSrc: Oral Oral Oral Oral  SpO2: 96% 96% 95% 96%  Weight:      Height:        Last BM Date: 11/23/15  Intake/Output   Yesterday:  10/11 0701 - 10/12 0700 In: 2633.3 [P.O.:120; I.V.:2413.3; IV Piggyback:100] Out: 1550 [Urine:300; Emesis/NG output:1250] This shift:  No intake/output data recorded.  Bowel function:  Flatus: YES  BM:  YES  Drain: (No drain)   Physical Exam:  General: Pt awake/alert/oriented x4 in No acute distress Eyes: PERRL, normal EOM.  Sclera clear.  No icterus Neuro: CN II-XII intact w/o focal sensory/motor deficits. Lymph: No head/neck/groin lymphadenopathy Psych:  No delerium/psychosis/paranoia HENT: Normocephalic, Mucus membranes moist.  No thrush Neck: Supple, No tracheal deviation Chest: CTAB. Chest wall pain w good excursion CV:  Pulses intact.  Regular rhythm MS: Normal AROM mjr joints.  No obvious deformity Abdomen: Soft.  Nondistended.  Nontender.  No evidence of peritonitis.  Diminished bowel sounds (difficult to appreciate over NGT suction). Ext:  SCDs BLE.  No mjr edema.  No cyanosis Skin: No petechiae / purpura  Results:   Labs: Results for orders placed or performed during the hospital encounter of 11/24/15 (  from the past 48 hour(s))  CBC     Status: Abnormal   Collection Time: 11/24/15  3:49 PM  Result Value Ref Range   WBC 6.2 4.0 - 10.5 K/uL   RBC 4.14 (L) 4.22 - 5.81 MIL/uL   Hemoglobin 12.8 (L) 13.0 - 17.0 g/dL   HCT 36.4 (L) 39.0 - 52.0 %   MCV 87.9 78.0 - 100.0 fL   MCH 30.9 26.0 - 34.0 pg   MCHC 35.2 30.0 - 36.0 g/dL   RDW 12.5 11.5 - 15.5 %   Platelets 304 150 - 400 K/uL  Creatinine, serum     Status: None   Collection Time: 11/24/15  3:49 PM  Result Value Ref Range   Creatinine, Ser 0.75 0.61 - 1.24 mg/dL   GFR calc non Af Amer >60 >60 mL/min   GFR calc  Af Amer >60 >60 mL/min    Comment: (NOTE) The eGFR has been calculated using the CKD EPI equation. This calculation has not been validated in all clinical situations. eGFR's persistently <60 mL/min signify possible Chronic Kidney Disease.   Comprehensive metabolic panel     Status: Abnormal   Collection Time: 11/25/15  9:02 AM  Result Value Ref Range   Sodium 135 135 - 145 mmol/L   Potassium 4.2 3.5 - 5.1 mmol/L   Chloride 105 101 - 111 mmol/L   CO2 19 (L) 22 - 32 mmol/L   Glucose, Bld 75 65 - 99 mg/dL   BUN 13 6 - 20 mg/dL   Creatinine, Ser 0.86 0.61 - 1.24 mg/dL   Calcium 8.7 (L) 8.9 - 10.3 mg/dL   Total Protein 6.7 6.5 - 8.1 g/dL   Albumin 3.6 3.5 - 5.0 g/dL   AST 21 15 - 41 U/L   ALT 16 (L) 17 - 63 U/L   Alkaline Phosphatase 76 38 - 126 U/L   Total Bilirubin 1.0 0.3 - 1.2 mg/dL   GFR calc non Af Amer >60 >60 mL/min   GFR calc Af Amer >60 >60 mL/min    Comment: (NOTE) The eGFR has been calculated using the CKD EPI equation. This calculation has not been validated in all clinical situations. eGFR's persistently <60 mL/min signify possible Chronic Kidney Disease.    Anion gap 11 5 - 15  Prealbumin     Status: Abnormal   Collection Time: 11/25/15  9:02 AM  Result Value Ref Range   Prealbumin 17.8 (L) 18 - 38 mg/dL    Comment: Performed at [facility]    Imaging / Studies: Ct Abdomen Pelvis W Contrast  Result Date: 11/25/2015 CLINICAL DATA:  Partial gastrectomy 2 months ago. 50 pound weight loss. History of gastric outlet obstruction. Rule out leak or abscess. EXAM: CT ABDOMEN AND PELVIS WITH CONTRAST TECHNIQUE: Multidetector CT imaging of the abdomen and pelvis was performed using the standard protocol following bolus administration of intravenous contrast. CONTRAST:  145m ISOVUE-300 IOPAMIDOL (ISOVUE-300) INJECTION 61% COMPARISON:  None. FINDINGS: Lower chest: Patchy opacity posteriorly in the right lower lung, new since prior study. Cannot exclude area of early  infiltrate. Left lung base is clear. Heart is normal size. Hepatobiliary: Prior cholecystectomy.  No focal hepatic abnormality. Pancreas: No focal abnormality or ductal dilatation. Spleen: No focal abnormality.  Normal size. Adrenals/Urinary Tract: No adrenal abnormality. No focal renal abnormality. No stones or hydronephrosis. Urinary bladder is unremarkable. Stomach/Bowel: There is distention of the stomach with layering fluid and gas. NG tube tip is in the proximal to mid stomach. Postsurgical  changes in the region of the distal stomach. Small bowel is decompressed. Scattered left colonic diverticula. No active diverticulitis. Vascular/Lymphatic: Diffuse aortoiliac atherosclerosis with calcifications. No aneurysm. No adenopathy. Reproductive: No visible focal abnormality. Other: No free fluid or free air. No focal fluid collection to suggest abscess. Musculoskeletal: No acute bony abnormality or focal bone lesion. IMPRESSION: Significant distention of the stomach with layering fluid and gas. NG tube is present in the proximal to mid stomach. Appearance concerning for Recurrent gastric outlet obstruction. Patchy new opacity noted posteriorly and right lower lobe. Cannot exclude early pneumonia. Prior cholecystectomy. Scattered left colonic diverticula.  No active diverticulitis. Aortoiliac atherosclerosis. These results will be called to the ordering clinician or representative by the Radiologist Assistant, and communication documented in the PACS or zVision Dashboard. Electronically Signed   By: Rolm Baptise M.D.   On: 11/25/2015 16:05    Medications / Allergies: per chart  Antibiotics: Anti-infectives    None        Note: Portions of this report may have been transcribed using voice recognition software. Every effort was made to ensure accuracy; however, inadvertent computerized transcription errors may be present.   Any transcriptional errors that result from this process are  unintentional.    [name], [place]   11/26/2015

## 2015-11-26 NOTE — Progress Notes (Signed)
Patient called and said he had a bad dream and that he might pulled something out,NT went inside the room and found that NG output is allover the floor NG cannister is already in the computer table IV is leaking. Patient was reassessed and reoriented. We will conitnue to monitor the patient.

## 2015-11-26 NOTE — Progress Notes (Signed)
[facility]  Patient Status: Active (receiving services up to time of hospitalization)  [facility] is providing the following services: ST  If patient discharges after hours, please call [phone].   Danny MachoSusan [name] [name] 11/26/2015, 11:12 AM

## 2015-11-26 NOTE — Progress Notes (Signed)
PHARMACY - ADULT TOTAL PARENTERAL NUTRITION CONSULT NOTE   Pharmacy Consult for TPN Indication: Gastric ileus / obstruction  Patient Measurements: Height: 6' (182.9 cm) Weight: 179 lb 8 oz (81.4 kg) IBW/kg (Calculated) : 77.6 TPN AdjBW (KG): 81.4 Body mass index is 24.34 kg/m. Usual Weight:   Insulin Requirements: No insulin orders currently  Current Nutrition: NPO  IVF: D5W + 1/2NS with KCl 60mEq/L at 100 ml/hr  Central access: PICC placed 10/12 TPN start date: 10/12  ASSESSMENT                                                                                                          HPI: 26 yoM admitted 10/9 with new upper abdominal discomfort and bloating with concern for recurrent GOO due to chronic pyloric structure.  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  Pharmacy consulted to start TPN for gastric ileus and obstruction 10/12.   Significant events:   Today:    Glucose - No hx DM noted.  CBGs at goal.   Electrolytes - Phos slightly low, Mg borderline low, other lytes WNL  Renal - No issues noted  LFTs - No issues noted  TGs -order for 10/13  Prealbumin - order for 10/13  NUTRITIONAL GOALS                                                                                             RD recs: 2250-2450 Kcal/day, 110-120g protein/day, 2.2-2.4L fluid/day  Clinimix 5/20 at a goal rate of 10ml/hr + 20% fat emulsion at 52ml/hr to provide: 108g/day protein, 2380Kcal/day.  **There is currently a Acupuncturist of Clinimix solution.  To conserve supply, will consider goal rate of Clinimix 5/20 at 83 ml/min (provides 2233 Kcal/day and 100g protein/day) which will keep total volume at 2L rather than increasing use to 3L.   PLAN                                                                                                                         Sodium Phosphorus 10 mmol x1  At 1800 today:  Start Clinimix  E5/20 at 34ml/hr.  20% fat emulsion at 10 ml/hr.  Plan to advance as tolerated to the goal rate.  TPN to contain standard multivitamins and trace elements.  Reduce IVF to 6060ml/hr.  Add sensitive SSI q4h .   TPN lab panels on Mondays & Thursdays.  Repeat CMET, Mg, Phos in AM  F/u daily.  Haynes Hoehnolleen Annelyse Rey, PharmD, BCPS 11/26/2015, 10:57 AM  Pager: [phone]

## 2015-11-27 ENCOUNTER — Ambulatory Visit: Payer: Self-pay | Admitting: Neurology

## 2015-11-27 LAB — DIFFERENTIAL
Basophils Absolute: 0 10*3/uL (ref 0.0–0.1)
Basophils Relative: 1 %
Eosinophils Absolute: 0.2 10*3/uL (ref 0.0–0.7)
Eosinophils Relative: 2 %
Lymphocytes Relative: 24 %
Lymphs Abs: 1.8 10*3/uL (ref 0.7–4.0)
Monocytes Absolute: 0.8 10*3/uL (ref 0.1–1.0)
Monocytes Relative: 11 %
Neutro Abs: 4.6 10*3/uL (ref 1.7–7.7)
Neutrophils Relative %: 62 %

## 2015-11-27 LAB — COMPREHENSIVE METABOLIC PANEL
ALK PHOS: 66 U/L (ref 38–126)
ALT: 14 U/L — AB (ref 17–63)
AST: 25 U/L (ref 15–41)
Albumin: 3.2 g/dL — ABNORMAL LOW (ref 3.5–5.0)
Anion gap: 6 (ref 5–15)
BUN: 6 mg/dL (ref 6–20)
CALCIUM: 8.3 mg/dL — AB (ref 8.9–10.3)
CHLORIDE: 107 mmol/L (ref 101–111)
CO2: 24 mmol/L (ref 22–32)
CREATININE: 0.76 mg/dL (ref 0.61–1.24)
Glucose, Bld: 115 mg/dL — ABNORMAL HIGH (ref 65–99)
Potassium: 4 mmol/L (ref 3.5–5.1)
SODIUM: 137 mmol/L (ref 135–145)
Total Bilirubin: 1.4 mg/dL — ABNORMAL HIGH (ref 0.3–1.2)
Total Protein: 6.2 g/dL — ABNORMAL LOW (ref 6.5–8.1)

## 2015-11-27 LAB — CBC
HCT: 34.2 % — ABNORMAL LOW (ref 39.0–52.0)
Hemoglobin: 11.7 g/dL — ABNORMAL LOW (ref 13.0–17.0)
MCH: 31.1 pg (ref 26.0–34.0)
MCHC: 34.2 g/dL (ref 30.0–36.0)
MCV: 91 fL (ref 78.0–100.0)
Platelets: 321 10*3/uL (ref 150–400)
RBC: 3.76 MIL/uL — ABNORMAL LOW (ref 4.22–5.81)
RDW: 13 % (ref 11.5–15.5)
WBC: 7.4 10*3/uL (ref 4.0–10.5)

## 2015-11-27 LAB — PREALBUMIN: Prealbumin: 12.8 mg/dL — ABNORMAL LOW (ref 18–38)

## 2015-11-27 LAB — MAGNESIUM: MAGNESIUM: 1.8 mg/dL (ref 1.7–2.4)

## 2015-11-27 LAB — GLUCOSE, CAPILLARY
GLUCOSE-CAPILLARY: 119 mg/dL — AB (ref 65–99)
Glucose-Capillary: 124 mg/dL — ABNORMAL HIGH (ref 65–99)
Glucose-Capillary: 132 mg/dL — ABNORMAL HIGH (ref 65–99)
Glucose-Capillary: 117 mg/dL — ABNORMAL HIGH (ref 65–99)

## 2015-11-27 LAB — PHOSPHORUS: Phosphorus: 2.8 mg/dL (ref 2.5–4.6)

## 2015-11-27 LAB — TRIGLYCERIDES: Triglycerides: 76 mg/dL (ref <150)

## 2015-11-27 MED ORDER — SODIUM CHLORIDE 0.9 % IV SOLN
500.0000 mg | Freq: Four times a day (QID) | INTRAVENOUS | Status: AC
  Administered 2015-11-27 – 2015-11-29 (×8): 500 mg via INTRAVENOUS
  Filled 2015-11-27 (×9): qty 10

## 2015-11-27 MED ORDER — M.V.I. ADULT IV INJ
INJECTION | INTRAVENOUS | Status: AC
  Administered 2015-11-27: 17:00:00 via INTRAVENOUS
  Filled 2015-11-27: qty 1440

## 2015-11-27 MED ORDER — TRACE MINERALS CR-CU-MN-SE-ZN 10-1000-500-60 MCG/ML IV SOLN
INTRAVENOUS | Status: DC
  Filled 2015-11-27: qty 1440

## 2015-11-27 MED ORDER — MAGNESIUM SULFATE IN D5W 1-5 GM/100ML-% IV SOLN
1.0000 g | Freq: Once | INTRAVENOUS | Status: AC
  Administered 2015-11-27: 1 g via INTRAVENOUS
  Filled 2015-11-27: qty 100

## 2015-11-27 MED ORDER — DIPHENHYDRAMINE HCL 50 MG/ML IJ SOLN
12.5000 mg | Freq: Four times a day (QID) | INTRAMUSCULAR | Status: DC | PRN
  Administered 2015-11-27 – 2015-12-02 (×10): 12.5 mg via INTRAVENOUS
  Filled 2015-11-27 (×9): qty 1

## 2015-11-27 MED ORDER — FAT EMULSION 20 % IV EMUL
240.0000 mL | INTRAVENOUS | Status: DC
  Filled 2015-11-27: qty 250

## 2015-11-27 MED ORDER — FAT EMULSION 20 % IV EMUL
240.0000 mL | INTRAVENOUS | Status: AC
  Administered 2015-11-27: 240 mL via INTRAVENOUS
  Filled 2015-11-27: qty 240

## 2015-11-27 NOTE — Progress Notes (Signed)
[name]  [address] [place], [place] [address] Phone: [phone] FAX: [phone]   [name] [id number] 10/10/1940  CARE TEAM:  PCP: Irven Shelling, MD  Outpatient Care Team: Patient Care Team: Lavone Orn, MD as PCP - General (Internal Medicine) Michael Boston, MD as Consulting Physician (General Surgery) Arta Silence, MD as Consulting Physician (Gastroenterology)  Inpatient Treatment Team: Treatment Team: Attending Provider: Michael Boston, MD; Consulting Physician: Arta Silence, MD; Registered Nurse: Cindy Hazy, RN  Problem List:   Principal Problem:   Gastric Ileus  Active Problems:   Essential hypertension, benign   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  Inc erythromycin                         Continue NPO - Nutrition assessment yesterday  CT Abdomen 10/11 - Significant stomach distention with layering fluid and gas, appearance concerning for recurrent gastric outlet obstruction             EGD with Removal of any remaining solid material and probable balloon dilation.  We'll do the operating room and do diagnostic laparoscopy and air leak to make sure I had not perforated. Probably will place a gastrostomy tube as well and possible feeding jejunostomy tube.  Plan for next Tuesday if he does not open up by then. Discussed with patient and wife risks. They understand and agree with this plan   IV TNA nutrition in the meantime since he has a prolonged ileus - consider if not opened up by next week, likely done by Dr. Johney Maine   VTE prophylaxis - SCDs  Mobilize as tolerated to help recovery   Olene Floss,  PA-S [facility]  11/27/2015   Subjective:  Denies abdominal pain or discomfort (headache from yesterday resolved) Urinating without difficulty Tolerating NG tube States flatus Wife in room  Objective:  Vital signs:  Vitals:   11/26/15 1508 11/26/15 1625 11/26/15 2146 11/27/15 0546  BP: (!) 157/69  (!) 142/49 (!) 128/40  Pulse: (!) 43   (!) 56  Resp: 18  18 18   Temp: 98.9 F (37.2 C)  97.9 F (36.6 C) 99 F (37.2 C)  TempSrc: Oral  Oral Oral  SpO2:   96% 96%  Weight:  82.1 kg (180 lb 14.4 oz)    Height:        Last BM Date: 11/25/15  Intake/Output   Yesterday:  10/12 0701 - 10/13 0700 In: 1842.2 [I.V.:1442.2; NG/GT:400] Out: 1702 [Urine:352; Emesis/NG output:1350] This shift:  No intake/output data recorded.  Bowel function:  Flatus: YES  BM:  No  Drain: Initially thin bilious but cloudy with flushing   Physical Exam:  General: Pt awake/alert/oriented x4 in No acute distress Eyes: PERRL, normal EOM.  Sclera clear.  No icterus Neuro: CN II-XII intact w/o focal sensory/motor deficits. Lymph: No head/neck/groin lymphadenopathy Psych:  No delerium/psychosis/paranoia HENT: Normocephalic, Mucus membranes moist.  No thrush Neck: Supple, No tracheal deviation Chest: CTAB. Chest wall pain w good excursion  CV:  Pulses intact.  Regular rhythm MS: Normal AROM mjr joints.  No obvious deformity Abdomen: Soft.  Nondistended.  Nontender.  No evidence of peritonitis.  Diminished bowel sounds (difficult to appreciate over NGT suction). Ext:  SCDs BLE.  No mjr edema.  No cyanosis Skin: No petechiae / purpura  Results:   Labs: Results for orders placed or performed during the hospital encounter of 11/24/15 (from the past 48 hour(s))  Comprehensive metabolic panel     Status: Abnormal   Collection Time: 11/25/15  9:02 AM  Result Value Ref Range   Sodium 135 135 - 145 mmol/L   Potassium 4.2 3.5 - 5.1 mmol/L   Chloride 105 101 - 111 mmol/L   CO2 19 (L) 22 - 32  mmol/L   Glucose, Bld 75 65 - 99 mg/dL   BUN 13 6 - 20 mg/dL   Creatinine, Ser 0.86 0.61 - 1.24 mg/dL   Calcium 8.7 (L) 8.9 - 10.3 mg/dL   Total Protein 6.7 6.5 - 8.1 g/dL   Albumin 3.6 3.5 - 5.0 g/dL   AST 21 15 - 41 U/L   ALT 16 (L) 17 - 63 U/L   Alkaline Phosphatase 76 38 - 126 U/L   Total Bilirubin 1.0 0.3 - 1.2 mg/dL   GFR calc non Af Amer >60 >60 mL/min   GFR calc Af Amer >60 >60 mL/min    Comment: (NOTE) The eGFR has been calculated using the CKD EPI equation. This calculation has not been validated in all clinical situations. eGFR's persistently <60 mL/min signify possible Chronic Kidney Disease.    Anion gap 11 5 - 15  Prealbumin     Status: Abnormal   Collection Time: 11/25/15  9:02 AM  Result Value Ref Range   Prealbumin 17.8 (L) 18 - 38 mg/dL    Comment: Performed at [facility] metabolic panel     Status: Abnormal   Collection Time: 11/26/15  8:09 AM  Result Value Ref Range   Sodium 135 135 - 145 mmol/L   Potassium 4.1 3.5 - 5.1 mmol/L   Chloride 107 101 - 111 mmol/L   CO2 23 22 - 32 mmol/L   Glucose, Bld 125 (H) 65 - 99 mg/dL   BUN 8 6 - 20 mg/dL   Creatinine, Ser 0.73 0.61 - 1.24 mg/dL   Calcium 8.6 (L) 8.9 - 10.3 mg/dL   GFR calc non Af Amer >60 >60 mL/min   GFR calc Af Amer >60 >60 mL/min    Comment: (NOTE) The eGFR has been calculated using the CKD EPI equation. This calculation has not been validated in all clinical situations. eGFR's persistently <60 mL/min signify possible Chronic Kidney Disease.    Anion gap 5 5 - 15  Phosphorus     Status: Abnormal   Collection Time: 11/26/15  8:09 AM  Result Value Ref Range   Phosphorus 2.3 (L) 2.5 - 4.6 mg/dL  Magnesium     Status: None   Collection Time: 11/26/15  8:09 AM  Result Value Ref Range   Magnesium 1.8 1.7 - 2.4 mg/dL  Glucose, capillary     Status: Abnormal   Collection Time: 11/26/15  4:48 PM  Result Value Ref Range   Glucose-Capillary 129 (H) 65 - 99 mg/dL  Glucose,  capillary     Status: Abnormal   Collection Time: 11/26/15  8:02 PM  Result Value Ref Range   Glucose-Capillary 153 (H) 65 - 99 mg/dL  Glucose, capillary  Status: Abnormal   Collection Time: 11/26/15 11:52 PM  Result Value Ref Range   Glucose-Capillary 162 (H) 65 - 99 mg/dL  Glucose, capillary     Status: Abnormal   Collection Time: 11/27/15  3:48 AM  Result Value Ref Range   Glucose-Capillary 124 (H) 65 - 99 mg/dL  Comprehensive metabolic panel     Status: Abnormal   Collection Time: 11/27/15  4:35 AM  Result Value Ref Range   Sodium 137 135 - 145 mmol/L   Potassium 4.0 3.5 - 5.1 mmol/L   Chloride 107 101 - 111 mmol/L   CO2 24 22 - 32 mmol/L   Glucose, Bld 115 (H) 65 - 99 mg/dL   BUN 6 6 - 20 mg/dL   Creatinine, Ser 0.76 0.61 - 1.24 mg/dL   Calcium 8.3 (L) 8.9 - 10.3 mg/dL   Total Protein 6.2 (L) 6.5 - 8.1 g/dL   Albumin 3.2 (L) 3.5 - 5.0 g/dL   AST 25 15 - 41 U/L   ALT 14 (L) 17 - 63 U/L   Alkaline Phosphatase 66 38 - 126 U/L   Total Bilirubin 1.4 (H) 0.3 - 1.2 mg/dL   GFR calc non Af Amer >60 >60 mL/min   GFR calc Af Amer >60 >60 mL/min    Comment: (NOTE) The eGFR has been calculated using the CKD EPI equation. This calculation has not been validated in all clinical situations. eGFR's persistently <60 mL/min signify possible Chronic Kidney Disease.    Anion gap 6 5 - 15  Magnesium     Status: None   Collection Time: 11/27/15  4:35 AM  Result Value Ref Range   Magnesium 1.8 1.7 - 2.4 mg/dL  Phosphorus     Status: None   Collection Time: 11/27/15  4:35 AM  Result Value Ref Range   Phosphorus 2.8 2.5 - 4.6 mg/dL  CBC     Status: Abnormal   Collection Time: 11/27/15  4:35 AM  Result Value Ref Range   WBC 7.4 4.0 - 10.5 K/uL   RBC 3.76 (L) 4.22 - 5.81 MIL/uL   Hemoglobin 11.7 (L) 13.0 - 17.0 g/dL   HCT 34.2 (L) 39.0 - 52.0 %   MCV 91.0 78.0 - 100.0 fL   MCH 31.1 26.0 - 34.0 pg   MCHC 34.2 30.0 - 36.0 g/dL   RDW 13.0 11.5 - 15.5 %   Platelets 321 150 - 400  K/uL  Differential     Status: None   Collection Time: 11/27/15  4:35 AM  Result Value Ref Range   Neutrophils Relative % 62 %   Neutro Abs 4.6 1.7 - 7.7 K/uL   Lymphocytes Relative 24 %   Lymphs Abs 1.8 0.7 - 4.0 K/uL   Monocytes Relative 11 %   Monocytes Absolute 0.8 0.1 - 1.0 K/uL   Eosinophils Relative 2 %   Eosinophils Absolute 0.2 0.0 - 0.7 K/uL   Basophils Relative 1 %   Basophils Absolute 0.0 0.0 - 0.1 K/uL    Imaging / Studies: Ct Abdomen Pelvis W Contrast  Result Date: 11/25/2015 CLINICAL DATA:  Partial gastrectomy 2 months ago. 50 pound weight loss. History of gastric outlet obstruction. Rule out leak or abscess. EXAM: CT ABDOMEN AND PELVIS WITH CONTRAST TECHNIQUE: Multidetector CT imaging of the abdomen and pelvis was performed using the standard protocol following bolus administration of intravenous contrast. CONTRAST:  16m ISOVUE-300 IOPAMIDOL (ISOVUE-300) INJECTION 61% COMPARISON:  None. FINDINGS: Lower chest: Patchy opacity posteriorly in the right lower  lung, new since prior study. Cannot exclude area of early infiltrate. Left lung base is clear. Heart is normal size. Hepatobiliary: Prior cholecystectomy.  No focal hepatic abnormality. Pancreas: No focal abnormality or ductal dilatation. Spleen: No focal abnormality.  Normal size. Adrenals/Urinary Tract: No adrenal abnormality. No focal renal abnormality. No stones or hydronephrosis. Urinary bladder is unremarkable. Stomach/Bowel: There is distention of the stomach with layering fluid and gas. NG tube tip is in the proximal to mid stomach. Postsurgical changes in the region of the distal stomach. Small bowel is decompressed. Scattered left colonic diverticula. No active diverticulitis. Vascular/Lymphatic: Diffuse aortoiliac atherosclerosis with calcifications. No aneurysm. No adenopathy. Reproductive: No visible focal abnormality. Other: No free fluid or free air. No focal fluid collection to suggest abscess. Musculoskeletal:  No acute bony abnormality or focal bone lesion. IMPRESSION: Significant distention of the stomach with layering fluid and gas. NG tube is present in the proximal to mid stomach. Appearance concerning for Recurrent gastric outlet obstruction. Patchy new opacity noted posteriorly and right lower lobe. Cannot exclude early pneumonia. Prior cholecystectomy. Scattered left colonic diverticula.  No active diverticulitis. Aortoiliac atherosclerosis. These results will be called to the ordering clinician or representative by the Radiologist Assistant, and communication documented in the PACS or zVision Dashboard. Electronically Signed   By: Rolm Baptise M.D.   On: 11/25/2015 16:05    Medications / Allergies: per chart  Antibiotics: Anti-infectives    Start     Dose/Rate Route Frequency Ordered Stop   11/26/15 0830  erythromycin 250 mg in sodium chloride 0.9 % 100 mL IVPB     250 mg 100 mL/hr over 60 Minutes Intravenous Every 6 hours 11/26/15 0751 11/28/15 0559        Note: Portions of this report may have been transcribed using voice recognition software. Every effort was made to ensure accuracy; however, inadvertent computerized transcription errors may be present.   Any transcriptional errors that result from this process are unintentional.    Olene Floss, [place]   11/27/2015

## 2015-11-27 NOTE — Progress Notes (Signed)
PHARMACY - ADULT TOTAL PARENTERAL NUTRITION CONSULT NOTE   Pharmacy Consult for TPN Indication: Gastric ileus / obstruction  Patient Measurements: Height: 6' (182.9 cm) Weight: 180 lb 14.4 oz (82.1 kg) IBW/kg (Calculated) : 77.6 TPN AdjBW (KG): 81.4 Body mass index is 24.53 kg/m. Usual Weight:   Insulin Requirements: 4 units insulin / 18 hours since initiation of TPN  Current Nutrition: NPO  IVF: D5W + 1/2NS with KCl 740mEq/L at 60 ml/hr  Central access: PICC placed 10/12 TPN start date: 10/12  ASSESSMENT                                                                                                          HPI: [age] yoM admitted 10/9 with new upper abdominal discomfort and bloating with concern for recurrent GOO due to chronic pyloric structure.  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  Pharmacy consulted to start TPN for gastric ileus and obstruction 10/12.   Significant events:   Today:    Glucose - No hx DM noted.  CBGs at goal.   Electrolytes - Phos now WNL after repletion, other lytes WNL  Renal - No issues noted  LFTs - No issues noted, T. Bili slight increase  TGs -76 (10/13)  Prealbumin - 17.8 (10/11), 12.8 (10/13)  NUTRITIONAL GOALS                                                                                             RD recs: 2250-2450 Kcal/day, 110-120g protein/day, 2.2-2.4L fluid/day  Clinimix 5/20 at a goal rate of 7090ml/hr + 20% fat emulsion at 7010ml/hr to provide: 108g/day protein, 2380Kcal/day.  **There is currently a [employer]up[employer]ur[employer]national backorder of Clinimix solution.  To conserve supply, will consider goal rate of Clinimix 5/20 at 83 ml/min (provides 2233 Kcal/day and 100g protein/day) which will keep total volume at 2L rather than increasing use to 3L.   PLAN                                                                                                                         Magnesium 1g IV x  1 today  At 1800 today:  Increase to Clinimix E5/20 at 65ml/hr.  20% fat emulsion at 10 ml/hr.  Plan to advance as tolerated to the goal rate.  TPN to contain standard multivitamins and trace elements.  Reduce IVF to 47ml/hr.  Continue sensitive SSI q4h .   TPN lab panels on Mondays & Thursdays.  Repeat CMET, Mg, Phos in AM  F/u daily.  Haynes Hoehn, PharmD, BCPS 11/27/2015, 11:14 AM  Pager: [phone]

## 2015-11-28 LAB — COMPREHENSIVE METABOLIC PANEL
ALK PHOS: 154 U/L — AB (ref 38–126)
ALT: 638 U/L — ABNORMAL HIGH (ref 17–63)
ANION GAP: 5 (ref 5–15)
AST: 711 U/L — ABNORMAL HIGH (ref 15–41)
Albumin: 2.9 g/dL — ABNORMAL LOW (ref 3.5–5.0)
BILIRUBIN TOTAL: 1.9 mg/dL — AB (ref 0.3–1.2)
BUN: 14 mg/dL (ref 6–20)
CALCIUM: 8.3 mg/dL — AB (ref 8.9–10.3)
CO2: 27 mmol/L (ref 22–32)
Chloride: 105 mmol/L (ref 101–111)
Creatinine, Ser: 0.72 mg/dL (ref 0.61–1.24)
GFR calc non Af Amer: >60 mL/min (ref >60)
GLUCOSE: 127 mg/dL — AB (ref 65–99)
Potassium: 4 mmol/L (ref 3.5–5.1)
Sodium: 137 mmol/L (ref 135–145)
TOTAL PROTEIN: 5.8 g/dL — AB (ref 6.5–8.1)

## 2015-11-28 LAB — PHOSPHORUS: PHOSPHORUS: 3.8 mg/dL (ref 2.5–4.6)

## 2015-11-28 LAB — MAGNESIUM: Magnesium: 2 mg/dL (ref 1.7–2.4)

## 2015-11-28 LAB — GLUCOSE, CAPILLARY
GLUCOSE-CAPILLARY: 132 mg/dL — AB (ref 65–99)
Glucose-Capillary: 119 mg/dL — ABNORMAL HIGH (ref 65–99)
Glucose-Capillary: 127 mg/dL — ABNORMAL HIGH (ref 65–99)
Glucose-Capillary: 136 mg/dL — ABNORMAL HIGH (ref 65–99)

## 2015-11-28 MED ORDER — FAT EMULSION 20 % IV EMUL
240.0000 mL | INTRAVENOUS | Status: AC
  Administered 2015-11-28: 240 mL via INTRAVENOUS
  Filled 2015-11-28: qty 250

## 2015-11-28 MED ORDER — TRACE MINERALS CR-CU-MN-SE-ZN 10-1000-500-60 MCG/ML IV SOLN
INTRAVENOUS | Status: AC
  Administered 2015-11-28: 18:00:00 via INTRAVENOUS
  Filled 2015-11-28: qty 1440

## 2015-11-28 MED ORDER — PANTOPRAZOLE SODIUM 40 MG IV SOLR
40.0000 mg | Freq: Two times a day (BID) | INTRAVENOUS | Status: DC
  Administered 2015-11-28 – 2015-12-01 (×7): 40 mg via INTRAVENOUS
  Filled 2015-11-28 (×8): qty 40

## 2015-11-28 NOTE — Progress Notes (Signed)
Danny  [name] [place], [place] [address] Phone: [phone] FAX: [phone]   [name] [id number] [birth year]  CARE TEAM:  PCP: Irven Shelling, MD  Outpatient Care Team: Patient Care Team: Lavone Orn, MD as PCP - General (Internal Medicine) Michael Boston, MD as Consulting Physician (General Surgery) Arta Silence, MD as Consulting Physician (Gastroenterology)  Inpatient Treatment Team: Treatment Team: Attending Provider: Michael Boston, MD; Consulting Physician: Arta Silence, MD; Technician: Sueanne Margarita, NT; Technician: Abbe Amsterdam, NT; Registered Nurse: Mertha Baars, RN  Problem List:   Principal Problem:   Gastric Ileus  Active Problems:   Essential hypertension, benign   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 09/25/2015   Anxiety state   Protein-calorie malnutrition, moderate ([facility])   Pyloric stricture     Assessment/Plan:              Stomach dilation - distention improved; NG tube in place, flushed                          Continue IV fluids, H2 blocker, Reglan.  Inc erythromycin PPI stopped? - reordered.  Okay to try IV every 12 hours at this point                         Continue NPO - Nutrition assessment yesterday   CT Abdomen 10/11 - Significant stomach distention with layering fluid and gas, appearance concerning for recurrent gastric outlet obstruction             EGD with Removal of any remaining solid material and probable balloon dilation.  We'll do the operating room and do diagnostic laparoscopy and air leak to make sure I had not perforated. Probably will place a gastrostomy tube as well and possible feeding jejunostomy tube.  Plan for next Tuesday if he does not open up by then. Discussed with patient and wife risks. They understand and agree with this plan   IV TNA nutrition in the meantime since he has a prolonged ileus.  Follow LFTs - slight inc  but not severe - d/w pharmacy  VTE prophylaxis - SCDs  Mobilize as tolerated to help recovery   Olene Floss, PA-S [facility]  11/28/2015   Subjective:  Denies abdominal pain or discomfort (headache from yesterday resolved) Urinating without difficulty Tolerating NG tube but getting sore States flatus Wife in room  Objective:  Vital signs:  Vitals:   11/27/15 0546 11/27/15 1521 11/27/15 2145 11/28/15 0557  BP: (!) 128/40 (!) 151/57 (!) 147/54 (!) 153/47  Pulse: (!) 56 67 (!) 114 (!) 55  Resp: 18 18 18 16   Temp: 99 F (37.2 C) 98.1 F (36.7 C) 98.5 F (36.9 C) 98.6 F (37 C)  TempSrc: Oral Oral Oral Oral  SpO2: 96% 96% 96% 97%  Weight:      Height:        Last BM Date: 11/25/15  Intake/Output   Yesterday:  10/13 0701 - 10/14 0700 In: 2383 [P.O.:60; I.V.:2023; IV Piggyback:300] Out: 2100 [Urine:900; Emesis/NG output:1200] This shift:  No intake/output data recorded.  Bowel function:  Flatus: YES  BM:  No  Drain: Initially thin bilious but cloudy with flushing   Physical Exam:  General: Pt awake/alert/oriented x4 in No acute distress Eyes: PERRL, normal EOM.  Sclera clear.  No icterus Neuro: CN II-XII intact w/o focal sensory/motor deficits. Lymph: No  head/neck/groin lymphadenopathy Psych:  No delerium/psychosis/paranoia HENT: Normocephalic, Mucus membranes moist.  No thrush Neck: Supple, No tracheal deviation Chest: CTAB. Chest wall pain w good excursion CV:  Pulses intact.  Regular rhythm MS: Normal AROM mjr joints.  No obvious deformity Abdomen: Soft.  Nondistended.  Nontender.  No evidence of peritonitis.  Diminished bowel sounds (difficult to appreciate over NGT suction). Ext:  SCDs BLE.  No mjr edema.  No cyanosis Skin: No petechiae / purpura  Results:   Labs: Results for orders placed or performed during the hospital encounter of 11/24/15 (from the past 48 hour(s))  Glucose, capillary     Status: Abnormal   Collection Time:  11/26/15  4:48 PM  Result Value Ref Range   Glucose-Capillary 129 (H) 65 - 99 mg/dL  Glucose, capillary     Status: Abnormal   Collection Time: 11/26/15  8:02 PM  Result Value Ref Range   Glucose-Capillary 153 (H) 65 - 99 mg/dL  Glucose, capillary     Status: Abnormal   Collection Time: 11/26/15 11:52 PM  Result Value Ref Range   Glucose-Capillary 162 (H) 65 - 99 mg/dL  Glucose, capillary     Status: Abnormal   Collection Time: 11/27/15  3:48 AM  Result Value Ref Range   Glucose-Capillary 124 (H) 65 - 99 mg/dL  Comprehensive metabolic panel     Status: Abnormal   Collection Time: 11/27/15  4:35 AM  Result Value Ref Range   Sodium 137 135 - 145 mmol/L   Potassium 4.0 3.5 - 5.1 mmol/L   Chloride 107 101 - 111 mmol/L   CO2 24 22 - 32 mmol/L   Glucose, Bld 115 (H) 65 - 99 mg/dL   BUN 6 6 - 20 mg/dL   Creatinine, Ser 0.76 0.61 - 1.24 mg/dL   Calcium 8.3 (L) 8.9 - 10.3 mg/dL   Total Protein 6.2 (L) 6.5 - 8.1 g/dL   Albumin 3.2 (L) 3.5 - 5.0 g/dL   AST 25 15 - 41 U/L   ALT 14 (L) 17 - 63 U/L   Alkaline Phosphatase 66 38 - 126 U/L   Total Bilirubin 1.4 (H) 0.3 - 1.2 mg/dL   GFR calc non Af Amer >60 >60 mL/min   GFR calc Af Amer >60 >60 mL/min    Comment: (NOTE) The eGFR has been calculated using the CKD EPI equation. This calculation has not been validated in all clinical situations. eGFR's persistently <60 mL/min signify possible Chronic Kidney Disease.    Anion gap 6 5 - 15  Prealbumin     Status: Abnormal   Collection Time: 11/27/15  4:35 AM  Result Value Ref Range   Prealbumin 12.8 (L) 18 - 38 mg/dL    Comment: Performed at [facility]  Magnesium     Status: None   Collection Time: 11/27/15  4:35 AM  Result Value Ref Range   Magnesium 1.8 1.7 - 2.4 mg/dL  Phosphorus     Status: None   Collection Time: 11/27/15  4:35 AM  Result Value Ref Range   Phosphorus 2.8 2.5 - 4.6 mg/dL  Triglycerides     Status: None   Collection Time: 11/27/15  4:35 AM  Result Value  Ref Range   Triglycerides 76 <150 mg/dL    Comment: Performed at [facility]  CBC     Status: Abnormal   Collection Time: 11/27/15  4:35 AM  Result Value Ref Range   WBC 7.4 4.0 - 10.5 K/uL   RBC  3.76 (L) 4.22 - 5.81 MIL/uL   Hemoglobin 11.7 (L) 13.0 - 17.0 g/dL   HCT 34.2 (L) 39.0 - 52.0 %   MCV 91.0 78.0 - 100.0 fL   MCH 31.1 26.0 - 34.0 pg   MCHC 34.2 30.0 - 36.0 g/dL   RDW 13.0 11.5 - 15.5 %   Platelets 321 150 - 400 K/uL  Differential     Status: None   Collection Time: 11/27/15  4:35 AM  Result Value Ref Range   Neutrophils Relative % 62 %   Neutro Abs 4.6 1.7 - 7.7 K/uL   Lymphocytes Relative 24 %   Lymphs Abs 1.8 0.7 - 4.0 K/uL   Monocytes Relative 11 %   Monocytes Absolute 0.8 0.1 - 1.0 K/uL   Eosinophils Relative 2 %   Eosinophils Absolute 0.2 0.0 - 0.7 K/uL   Basophils Relative 1 %   Basophils Absolute 0.0 0.0 - 0.1 K/uL  Glucose, capillary     Status: Abnormal   Collection Time: 11/27/15  8:03 AM  Result Value Ref Range   Glucose-Capillary 132 (H) 65 - 99 mg/dL  Glucose, capillary     Status: Abnormal   Collection Time: 11/27/15 12:10 PM  Result Value Ref Range   Glucose-Capillary 119 (H) 65 - 99 mg/dL  Glucose, capillary     Status: Abnormal   Collection Time: 11/27/15  3:45 PM  Result Value Ref Range   Glucose-Capillary 117 (H) 65 - 99 mg/dL  Glucose, capillary     Status: Abnormal   Collection Time: 11/28/15 12:12 AM  Result Value Ref Range   Glucose-Capillary 136 (H) 65 - 99 mg/dL  Glucose, capillary     Status: Abnormal   Collection Time: 11/28/15  4:26 AM  Result Value Ref Range   Glucose-Capillary 127 (H) 65 - 99 mg/dL  Comprehensive metabolic panel     Status: Abnormal   Collection Time: 11/28/15  4:43 AM  Result Value Ref Range   Sodium 137 135 - 145 mmol/L   Potassium 4.0 3.5 - 5.1 mmol/L   Chloride 105 101 - 111 mmol/L   CO2 27 22 - 32 mmol/L   Glucose, Bld 127 (H) 65 - 99 mg/dL   BUN 14 6 - 20 mg/dL   Creatinine, Ser 0.72 0.61 -  1.24 mg/dL   Calcium 8.3 (L) 8.9 - 10.3 mg/dL   Total Protein 5.8 (L) 6.5 - 8.1 g/dL   Albumin 2.9 (L) 3.5 - 5.0 g/dL   AST 711 (H) 15 - 41 U/L   ALT 638 (H) 17 - 63 U/L   Alkaline Phosphatase 154 (H) 38 - 126 U/L   Total Bilirubin 1.9 (H) 0.3 - 1.2 mg/dL   GFR calc non Af Amer >60 >60 mL/min   GFR calc Af Amer >60 >60 mL/min    Comment: (NOTE) The eGFR has been calculated using the CKD EPI equation. This calculation has not been validated in all clinical situations. eGFR's persistently <60 mL/min signify possible Chronic Kidney Disease.    Anion gap 5 5 - 15  Magnesium     Status: None   Collection Time: 11/28/15  4:43 AM  Result Value Ref Range   Magnesium 2.0 1.7 - 2.4 mg/dL  Phosphorus     Status: None   Collection Time: 11/28/15  4:43 AM  Result Value Ref Range   Phosphorus 3.8 2.5 - 4.6 mg/dL  Glucose, capillary     Status: Abnormal   Collection Time: 11/28/15  7:46 AM  Result Value Ref Range   Glucose-Capillary 132 (H) 65 - 99 mg/dL    Imaging / Studies: No results found.  Medications / Allergies: per chart  Antibiotics: Anti-infectives    Start     Dose/Rate Route Frequency Ordered Stop   11/27/15 1200  erythromycin 500 mg in sodium chloride 0.9 % 100 mL IVPB     500 mg 100 mL/hr over 60 Minutes Intravenous Every 6 hours 11/27/15 0940 11/29/15 0938   11/26/15 0830  erythromycin 250 mg in sodium chloride 0.9 % 100 mL IVPB  Status:  Discontinued     250 mg 100 mL/hr over 60 Minutes Intravenous Every 6 hours 11/26/15 0751 11/27/15 0940        Note: Portions of this report may have been transcribed using voice recognition software. Every effort was made to ensure accuracy; however, inadvertent computerized transcription errors may be present.   Any transcriptional errors that result from this process are unintentional.    Olene Floss, [place]   11/28/2015

## 2015-11-28 NOTE — Progress Notes (Signed)
PHARMACY - ADULT TOTAL PARENTERAL NUTRITION CONSULT NOTE   Pharmacy Consult for TPN Indication: Gastric ileus / obstruction  Patient Measurements: Height: 6' (182.9 cm) Weight: 180 lb 14.4 oz (82.1 kg) IBW/kg (Calculated) : 77.6 TPN AdjBW (KG): 81.4 Body mass index is 24.53 kg/m. Usual Weight:   Insulin Requirements: 3 units last 24hrs   Current Nutrition: NPO  IVF: D5W + 1/2NS with KCl 340mEq/L at 40 ml/hr  Central access: PICC placed 10/12 TPN start date: 10/12  ASSESSMENT                                                                                                          HPI: [id number] yoM admitted 10/9 with new upper abdominal discomfort and bloating with concern for recurrent GOO due to chronic pyloric stricture. 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  Pharmacy consulted to start TPN for gastric ileus and obstruction 10/12.   Significant events:   Today:   Glucose - No hx DM noted. CBGs at goal.   Electrolytes - Phos now WNL after repletion, other lytes WNL. CorrCa 9.2.  Renal - No issues noted  LFTs - significantly increased overnight. Spoke with Dr. Michaell CowingGross, likely d/t obstruction not TPN.    TGs -76 (10/13)  Prealbumin - 17.8 (10/11), 12.8 (10/13)  NUTRITIONAL GOALS                                                                                             RD recs: 2250-2450 Kcal/day, 110-120g protein/day, 2.2-2.4L fluid/day  Clinimix 5/20 at a goal rate of 990ml/hr + 20% fat emulsion at 7010ml/hr to provide: 108g/day protein, 2380Kcal/day.  **There is currently a Acupuncturistnational backorder of Clinimix solution.  To conserve supply, will consider goal rate of Clinimix 5/20 at 83 ml/min (provides 2233 Kcal/day and 100g protein/day) which will keep total volume at 2L rather than increasing use to 3L.   PLAN                                                                                                                          At 1800 today:  Cont  Clinimix E5/20 at 24ml/hr.  Cont 20% fat emulsion at 10 ml/hr.  Plan to advance as tolerated to the goal rate. Holding off today to be cautious d/t LFTs.  TPN to contain standard multivitamins and trace elements.  Cont IVF at 60ml/hr.  Cont sensitive SSI q4h .   TPN lab panels on Mondays & Thursdays.  F/u daily.  Charolotte Eke, PharmD, pager [phone]. 11/28/2015,12:37 PM.

## 2015-11-29 ENCOUNTER — Inpatient Hospital Stay (HOSPITAL_COMMUNITY): Payer: Medicare Other

## 2015-11-29 ENCOUNTER — Encounter (HOSPITAL_COMMUNITY): Payer: Self-pay | Admitting: Surgery

## 2015-11-29 LAB — COMPREHENSIVE METABOLIC PANEL
ALBUMIN: 2.9 g/dL — AB (ref 3.5–5.0)
ALT: 384 U/L — ABNORMAL HIGH (ref 17–63)
ANION GAP: 3 — AB (ref 5–15)
AST: 210 U/L — ABNORMAL HIGH (ref 15–41)
Alkaline Phosphatase: 142 U/L — ABNORMAL HIGH (ref 38–126)
BUN: 18 mg/dL (ref 6–20)
CHLORIDE: 106 mmol/L (ref 101–111)
CO2: 26 mmol/L (ref 22–32)
Calcium: 8.2 mg/dL — ABNORMAL LOW (ref 8.9–10.3)
Creatinine, Ser: 0.62 mg/dL (ref 0.61–1.24)
GFR calc Af Amer: >60 mL/min (ref >60)
GFR calc non Af Amer: >60 mL/min (ref >60)
GLUCOSE: 116 mg/dL — AB (ref 65–99)
POTASSIUM: 3.7 mmol/L (ref 3.5–5.1)
SODIUM: 135 mmol/L (ref 135–145)
Total Bilirubin: 0.6 mg/dL (ref 0.3–1.2)
Total Protein: 6 g/dL — ABNORMAL LOW (ref 6.5–8.1)

## 2015-11-29 LAB — GLUCOSE, CAPILLARY
GLUCOSE-CAPILLARY: 125 mg/dL — AB (ref 65–99)
Glucose-Capillary: 126 mg/dL — ABNORMAL HIGH (ref 65–99)

## 2015-11-29 MED ORDER — FAT EMULSION 20 % IV EMUL
240.0000 mL | INTRAVENOUS | Status: AC
  Administered 2015-11-29: 240 mL via INTRAVENOUS
  Filled 2015-11-29: qty 250

## 2015-11-29 MED ORDER — TRACE MINERALS CR-CU-MN-SE-ZN 10-1000-500-60 MCG/ML IV SOLN
INTRAVENOUS | Status: AC
  Administered 2015-11-29: 18:00:00 via INTRAVENOUS
  Filled 2015-11-29: qty 1992

## 2015-11-29 MED ORDER — INSULIN ASPART 100 UNIT/ML ~~LOC~~ SOLN
0.0000 [IU] | Freq: Four times a day (QID) | SUBCUTANEOUS | Status: DC
  Administered 2015-11-29 – 2015-12-02 (×7): 1 [IU] via SUBCUTANEOUS
  Administered 2015-12-02 – 2015-12-03 (×5): 2 [IU] via SUBCUTANEOUS

## 2015-11-29 MED ORDER — KCL IN DEXTROSE-NACL 40-5-0.45 MEQ/L-%-% IV SOLN
INTRAVENOUS | Status: DC
  Administered 2015-11-29 – 2015-12-02 (×3): via INTRAVENOUS
  Filled 2015-11-29 (×4): qty 1000

## 2015-11-29 NOTE — Progress Notes (Signed)
PHARMACY - ADULT TOTAL PARENTERAL NUTRITION CONSULT NOTE   Pharmacy Consult for TPN Indication: Gastric ileus / obstruction  Patient Measurements: Height: 6' (182.9 cm) Weight: 180 lb 14.4 oz (82.1 kg) IBW/kg (Calculated) : 77.6 TPN AdjBW (KG): 81.4 Body mass index is 24.53 kg/m. Usual Weight:   Insulin Requirements: 2 units last 24hrs   Current Nutrition: NPO  IVF: D5W + 1/2NS with KCl 3040mEq/L at 40 ml/hr  Central access: PICC placed 10/12 TPN start date: 10/12  ASSESSMENT                                                                                                          HPI: [id number] yoM admitted 10/9 with new upper abdominal discomfort and bloating with concern for recurrent GOO due to chronic pyloric stricture. 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  Pharmacy consulted to start TPN for gastric ileus and obstruction 10/12.   Significant events:   Today:   Glucose - No hx DM noted. CBGs at goal.   Electrolytes - Phos now WNL after repletion, other lytes WNL. CorrCa 9.2.  Renal - No issues noted  LFTs - much better this am.     TGs -76 (10/13)  Prealbumin - 17.8 (10/11), 12.8 (10/13)  NUTRITIONAL GOALS                                                                                             RD recs: 2250-2450 Kcal/day, 110-120g protein/day, 2.2-2.4L fluid/day  Clinimix 5/20 at a goal rate of 3190ml/hr + 20% fat emulsion at 9310ml/hr to provide: 108g/day protein, 2380Kcal/day.  **There is currently a Acupuncturistnational backorder of Clinimix solution.  To conserve supply, will consider goal rate of Clinimix 5/20 at 83 ml/min (provides 2233 Kcal/day and 100g protein/day) which will keep total volume at 2L rather than increasing use to 3L.   PLAN                                                                                                                         At 1800 today:  Increase Clinimix E5/20 at 5983ml/hr.  Goal rate.  Cont 20% fat emulsion at 10 ml/hr.  TPN to contain standard multivitamins and trace elements.  Decrease IVF to 45ml/hr.  Cont sensitive SSI but reduce frequency of CBGs to q6h.   TPN lab panels on Mondays & Thursdays.  F/u daily.  Charolotte Eke, PharmD, pager [phone]. 11/29/2015,8:19 AM.

## 2015-11-29 NOTE — Progress Notes (Signed)
Patient NG tube came out while bathing.  NG replaced, placement confirmed through auscultation. Immediate return of 200 cc dark green fluid.

## 2015-11-29 NOTE — Progress Notes (Signed)
[facility]  [address] [place], [place] [address] Phone: [phone] FAX: [phone]   RICHMOND COLDREN [id number] 1940-05-14  CARE TEAM:  PCP: Irven Shelling, MD  Outpatient Care Team: Patient Care Team: Lavone Orn, MD as PCP - General (Internal Medicine) Michael Boston, MD as Consulting Physician (General Surgery) Arta Silence, MD as Consulting Physician (Gastroenterology)  Inpatient Treatment Team: Treatment Team: Attending Provider: Michael Boston, MD; Consulting Physician: Arta Silence, MD; Technician: Sueanne Margarita, NT; Technician: Abbe Amsterdam, NT; Registered Nurse: Mertha Baars, RN; Technician: Raylene Everts, NT  Problem List:   Principal Problem:   Gastric Ileus  Active Problems:   Essential hypertension, benign   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 09/25/2015   Anxiety state   Protein-calorie malnutrition, moderate ([place])   Pyloric stricture     Assessment/Plan:              Stomach dilation - distention improved but persistent  NG tube in place, flushed                          Continue IV fluids, H2 blocker, PPI, Reglan.  erythromycin PPI -  Okay to try IV every 12 hours at this point Check Xrays to f/u ileus & constipation                           CT Abdomen 10/11 - Significant stomach distention with layering fluid and gas, appearance concerning for recurrent gastric outlet obstruction             If not opened up by 10/17:  EGD with Removal of any remaining solid material and probable balloon dilation.  Do in the operating room and do diagnostic laparoscopy and air leak to make sure I had not perforated. Probably will place a gastrostomy tube as well and possible feeding jejunostomy tube.  Discussed with patient and wife & daughter the risks. They understand and agree with this plan   IV TNA nutrition in the meantime since he has a prolonged ileus.  Follow LFTs -  slight inc but not severe - d/w pharmacy  VTE prophylaxis - SCDs Anxiolysis HTN control Mobilize as tolerated to help recovery   [name], M.D., F.A.C.S. Gastrointestinal and Minimally Invasive Surgery [facility] [address] [place], [place] [address] [phone] Main / Paging    11/29/2015   Subjective:  Denies abdominal pain or discomfort  Urinating without difficulty Tolerating NG tube but nares/throat sore States flatus Daughter in room  Objective:  Vital signs:  Vitals:   11/28/15 1242 11/28/15 2155 11/29/15 0434 11/29/15 0522  BP: (!) 145/54 (!) 144/48 133/62   Pulse: 63 (!) 110 (!) 43 60  Resp: 16 16 16    Temp: 99.3 F (37.4 C) 98.4 F (36.9 C) 97.5 F (36.4 C)   TempSrc: Oral Oral Oral   SpO2: 98% 96% 97%   Weight:      Height:        Last BM Date: 11/25/15  Intake/Output   Yesterday:  10/14 0701 - 10/15 0700 In: 3550.2 [I.V.:3050.2; IV Piggyback:500] Out: 2000 [Urine:1200; Emesis/NG output:800] This shift:  No intake/output data recorded.  Bowel function:  Flatus: No  BM:  No  Drain: Thin bilious.  NGT flushes more easily & less cloudy today   Physical Exam:  General: Pt  awake/alert/oriented x4 in No acute distress Eyes: PERRL, normal EOM.  Sclera clear.  No icterus Neuro: CN II-XII intact w/o focal sensory/motor deficits. Lymph: No head/neck/groin lymphadenopathy Psych:  No delerium/psychosis/paranoia HENT: Normocephalic, Mucus membranes moist.  No thrush Neck: Supple, No tracheal deviation Chest: CTAB. Chest wall pain w good excursion CV:  Pulses intact.  Regular rhythm MS: Normal AROM mjr joints.  No obvious deformity Abdomen: Soft.  Nondistended.  Nontender.  No evidence of peritonitis.  Diminished bowel sounds (difficult to appreciate over NGT suction). Ext:  SCDs BLE.  No mjr edema.  No cyanosis Skin: No petechiae / purpura  Results:   Labs: Results for orders placed or  performed during the hospital encounter of 11/24/15 (from the past 48 hour(s))  Glucose, capillary     Status: Abnormal   Collection Time: 11/27/15 12:10 PM  Result Value Ref Range   Glucose-Capillary 119 (H) 65 - 99 mg/dL  Glucose, capillary     Status: Abnormal   Collection Time: 11/27/15  3:45 PM  Result Value Ref Range   Glucose-Capillary 117 (H) 65 - 99 mg/dL  Glucose, capillary     Status: Abnormal   Collection Time: 11/28/15 12:12 AM  Result Value Ref Range   Glucose-Capillary 136 (H) 65 - 99 mg/dL  Glucose, capillary     Status: Abnormal   Collection Time: 11/28/15  4:26 AM  Result Value Ref Range   Glucose-Capillary 127 (H) 65 - 99 mg/dL  Comprehensive metabolic panel     Status: Abnormal   Collection Time: 11/28/15  4:43 AM  Result Value Ref Range   Sodium 137 135 - 145 mmol/L   Potassium 4.0 3.5 - 5.1 mmol/L   Chloride 105 101 - 111 mmol/L   CO2 27 22 - 32 mmol/L   Glucose, Bld 127 (H) 65 - 99 mg/dL   BUN 14 6 - 20 mg/dL   Creatinine, Ser 0.72 0.61 - 1.24 mg/dL   Calcium 8.3 (L) 8.9 - 10.3 mg/dL   Total Protein 5.8 (L) 6.5 - 8.1 g/dL   Albumin 2.9 (L) 3.5 - 5.0 g/dL   AST 711 (H) 15 - 41 U/L   ALT 638 (H) 17 - 63 U/L   Alkaline Phosphatase 154 (H) 38 - 126 U/L   Total Bilirubin 1.9 (H) 0.3 - 1.2 mg/dL   GFR calc non Af Amer >60 >60 mL/min   GFR calc Af Amer >60 >60 mL/min    Comment: (NOTE) The eGFR has been calculated using the CKD EPI equation. This calculation has not been validated in all clinical situations. eGFR's persistently <60 mL/min signify possible Chronic Kidney Disease.    Anion gap 5 5 - 15  Magnesium     Status: None   Collection Time: 11/28/15  4:43 AM  Result Value Ref Range   Magnesium 2.0 1.7 - 2.4 mg/dL  Phosphorus     Status: None   Collection Time: 11/28/15  4:43 AM  Result Value Ref Range   Phosphorus 3.8 2.5 - 4.6 mg/dL  Glucose, capillary     Status: Abnormal   Collection Time: 11/28/15  7:46 AM  Result Value Ref Range    Glucose-Capillary 132 (H) 65 - 99 mg/dL  Glucose, capillary     Status: Abnormal   Collection Time: 11/28/15  4:09 PM  Result Value Ref Range   Glucose-Capillary 119 (H) 65 - 99 mg/dL  Comprehensive metabolic panel     Status: Abnormal   Collection Time: 11/29/15  4:45 AM  Result Value Ref Range   Sodium 135 135 - 145 mmol/L   Potassium 3.7 3.5 - 5.1 mmol/L   Chloride 106 101 - 111 mmol/L   CO2 26 22 - 32 mmol/L   Glucose, Bld 116 (H) 65 - 99 mg/dL   BUN 18 6 - 20 mg/dL   Creatinine, Ser 0.62 0.61 - 1.24 mg/dL   Calcium 8.2 (L) 8.9 - 10.3 mg/dL   Total Protein 6.0 (L) 6.5 - 8.1 g/dL   Albumin 2.9 (L) 3.5 - 5.0 g/dL   AST 210 (H) 15 - 41 U/L   ALT 384 (H) 17 - 63 U/L   Alkaline Phosphatase 142 (H) 38 - 126 U/L   Total Bilirubin 0.6 0.3 - 1.2 mg/dL   GFR calc non Af Amer >60 >60 mL/min   GFR calc Af Amer >60 >60 mL/min    Comment: (NOTE) The eGFR has been calculated using the CKD EPI equation. This calculation has not been validated in all clinical situations. eGFR's persistently <60 mL/min signify possible Chronic Kidney Disease.    Anion gap 3 (L) 5 - 15  Glucose, capillary     Status: Abnormal   Collection Time: 11/29/15  7:53 AM  Result Value Ref Range   Glucose-Capillary 126 (H) 65 - 99 mg/dL    Imaging / Studies: No results found.  Medications / Allergies: per chart  Antibiotics: Anti-infectives    Start     Dose/Rate Route Frequency Ordered Stop   11/27/15 1200  erythromycin 500 mg in sodium chloride 0.9 % 100 mL IVPB     500 mg 100 mL/hr over 60 Minutes Intravenous Every 6 hours 11/27/15 0940 11/29/15 0608   11/26/15 0830  erythromycin 250 mg in sodium chloride 0.9 % 100 mL IVPB  Status:  Discontinued     250 mg 100 mL/hr over 60 Minutes Intravenous Every 6 hours 11/26/15 0751 11/27/15 0940        Note: Portions of this report may have been transcribed using voice recognition software. Every effort was made to ensure accuracy; however, inadvertent  computerized transcription errors may be present.   Any transcriptional errors that result from this process are unintentional.    [name], M.D., F.A.C.S. Gastrointestinal and Minimally Invasive Surgery [facility] [address], [place],  [address] [phone] Main / Paging     11/29/2015

## 2015-11-30 LAB — COMPREHENSIVE METABOLIC PANEL
ALBUMIN: 2.9 g/dL — AB (ref 3.5–5.0)
ALK PHOS: 121 U/L (ref 38–126)
ALT: 236 U/L — AB (ref 17–63)
AST: 86 U/L — AB (ref 15–41)
Anion gap: 6 (ref 5–15)
BUN: 21 mg/dL — AB (ref 6–20)
CALCIUM: 8.4 mg/dL — AB (ref 8.9–10.3)
CO2: 23 mmol/L (ref 22–32)
CREATININE: 0.61 mg/dL (ref 0.61–1.24)
Chloride: 107 mmol/L (ref 101–111)
GFR calc non Af Amer: >60 mL/min (ref >60)
GLUCOSE: 108 mg/dL — AB (ref 65–99)
Potassium: 4.1 mmol/L (ref 3.5–5.1)
SODIUM: 136 mmol/L (ref 135–145)
Total Bilirubin: 0.6 mg/dL (ref 0.3–1.2)
Total Protein: 6 g/dL — ABNORMAL LOW (ref 6.5–8.1)

## 2015-11-30 LAB — CBC
HEMATOCRIT: 32.8 % — AB (ref 39.0–52.0)
HEMOGLOBIN: 11.2 g/dL — AB (ref 13.0–17.0)
MCH: 30.1 pg (ref 26.0–34.0)
MCHC: 34.1 g/dL (ref 30.0–36.0)
MCV: 88.2 fL (ref 78.0–100.0)
Platelets: 253 10*3/uL (ref 150–400)
RBC: 3.72 MIL/uL — ABNORMAL LOW (ref 4.22–5.81)
RDW: 12.7 % (ref 11.5–15.5)
WBC: 6.7 10*3/uL (ref 4.0–10.5)

## 2015-11-30 LAB — DIFFERENTIAL
BASOS PCT: 1 %
Basophils Absolute: 0.1 10*3/uL (ref 0.0–0.1)
EOS ABS: 0.3 10*3/uL (ref 0.0–0.7)
Eosinophils Relative: 4 %
LYMPHS ABS: 2 10*3/uL (ref 0.7–4.0)
Lymphocytes Relative: 30 %
MONO ABS: 1.2 10*3/uL — AB (ref 0.1–1.0)
MONOS PCT: 18 %
Neutro Abs: 3.1 10*3/uL (ref 1.7–7.7)
Neutrophils Relative %: 47 %

## 2015-11-30 LAB — GLUCOSE, CAPILLARY
Glucose-Capillary: 121 mg/dL — ABNORMAL HIGH (ref 65–99)
Glucose-Capillary: 125 mg/dL — ABNORMAL HIGH (ref 65–99)

## 2015-11-30 LAB — PREALBUMIN: Prealbumin: 11 mg/dL — ABNORMAL LOW (ref 18–38)

## 2015-11-30 LAB — PHOSPHORUS: Phosphorus: 3.7 mg/dL (ref 2.5–4.6)

## 2015-11-30 LAB — MAGNESIUM: Magnesium: 1.9 mg/dL (ref 1.7–2.4)

## 2015-11-30 LAB — TRIGLYCERIDES: Triglycerides: 97 mg/dL (ref <150)

## 2015-11-30 MED ORDER — TRACE MINERALS CR-CU-MN-SE-ZN 10-1000-500-60 MCG/ML IV SOLN
INTRAVENOUS | Status: AC
  Administered 2015-11-30: 17:00:00 via INTRAVENOUS
  Filled 2015-11-30: qty 1992

## 2015-11-30 MED ORDER — FAT EMULSION 20 % IV EMUL
250.0000 mL | INTRAVENOUS | Status: AC
  Administered 2015-11-30: 250 mL via INTRAVENOUS
  Filled 2015-11-30: qty 250

## 2015-11-30 NOTE — Progress Notes (Signed)
PHARMACY - ADULT TOTAL PARENTERAL NUTRITION CONSULT NOTE   Pharmacy Consult for TPN Indication: Gastric ileus / obstruction  Patient Measurements: Height: 6' (182.9 cm) Weight: 180 lb 14.4 oz (82.1 kg) IBW/kg (Calculated) : 77.6 TPN AdjBW (KG): 81.4 Body mass index is 24.53 kg/m. Usual Weight:   Insulin Requirements: 1 units yesterday  Current Nutrition: NPO  IVF: D5W + 1/2NS with KCl 6840mEq/L at 20 ml/hr  Central access: PICC placed 10/12 TPN start date: 10/12  ASSESSMENT                                                                                                          HPI: 89 yoM admitted 10/9 with new upper abdominal discomfort and bloating with concern for recurrent GOO due to chronic pyloric stricture. 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  Pharmacy consulted to start TPN for gastric ileus and obstruction 10/12.   Significant events:   Today:   Glucose - No hx DM noted. CBGs at goal.   Electrolytes - lytes WNL. CorrCa 9.3.  Renal - No issues noted  LFTs - much better this am.     TGs -76 (10/13), 97 (10/16)  Prealbumin - 17.8 (10/11), 12.8 (10/13), 11 (10/16)  NUTRITIONAL GOALS                                                                                             RD recs: 2250-2450 Kcal/day, 110-120g protein/day, 2.2-2.4L fluid/day  Clinimix 5/20 at a goal rate of 1990ml/hr + 20% fat emulsion at 6110ml/hr to provide: 108g/day protein, 2380Kcal/day.  **There is currently a Acupuncturistnational backorder of Clinimix solution.  To conserve supply, will consider goal rate of Clinimix 5/20 at 83 ml/min (provides 2233 Kcal/day and 100g protein/day) which will keep total volume at 2L rather than increasing use to 3L.   PLAN                                                                                                                         At 1800 today:  Continue  Clinimix E5/20 at 5083ml/hr. Goal  rate.  Cont 20% fat  emulsion at 10 ml/hr.  TPN to contain standard multivitamins and trace elements.  continue IVF to 67ml/hr.  Cont sensitive SSI q6h.   TPN lab panels on Mondays & Thursdays.  F/u daily.  Arley Phenix RPh 11/30/2015, 10:33 AM Pager [phone]

## 2015-11-30 NOTE — Progress Notes (Signed)
Nutrition Follow-up  DOCUMENTATION CODES:   Severe malnutrition in context of acute illness/injury  INTERVENTION:   TPN per Pharmacy  Tube Feeding Recommendations: Recommend Osmolite 1.5 @ goal rate of 65 ml/hr.  30 ml Prostat once daily. Tube feeding regimen provides 2440 kcal (100% of needs), 112 grams of protein, and 1188 ml of H2O.   RD to continue to monitor for plan  NUTRITION DIAGNOSIS:   Inadequate oral intake related to inability to eat as evidenced by NPO status.  Ongoing.  GOAL:   Patient will meet greater than or equal to 90% of their needs  Meeting with TPN.  MONITOR:   Labs, Weight trends, I & O's, Other (Comment) (TPN)  ASSESSMENT:   75 year old male presenting for a post-operative visit. Note for "Post-Operative": Patient returns one month status post robotic resection.  Distal gastrectomy with Billroth I gastroduodenal reconstruction for chronic ulcer causing obstruction.  09/25/2015  Patient continues with NGT for suction, output: 250 ml. Pt is receiving TPN at goal rate: Clinimix E5/20 at 83 ml/hr and ILE at 10 ml/hr, providing 2233 kcal(99% of needs) and 100g protein (91% of needs).   Noted plan per surgery, "If not opened up by 10/17: EGD with Removal of any remaining solid material and probable balloon dilation.  Do in the operating room and do diagnostic laparoscopy and air leak test to make sure I had not perforated. Place a gastrostomy tube as well and probable feeding jejunostomy tube.  Hopefully could then transitioned to tube feeds and wean off IV nutrition.  "  RD will leave recommendations for tube feeding if needed.    Medications: Reglan IV every 8 hours, D5 and .45% NaCl w/ KCl infusion at 20 ml/hr -provides 81 kcal Labs reviewed: CBGs: 125-126 Mg/Phos/K WNL TG: 97 mg/dL  Plan per Pharmacy 12/9108/16: At 1800 today:  Continue  Clinimix E5/20 at 3883ml/hr. Goal rate.  Cont 20% fat emulsion at 10 ml/hr.  Diet Order:  .TPN (CLINIMIX-E)  Adult Diet NPO time specified Except for: Ice Chips TPN (CLINIMIX-E) Adult  Skin:  Reviewed, no issues  Last BM:  10/11  Height:   Ht Readings from Last 1 Encounters:  11/24/15 6' (1.829 m)    Weight:   Wt Readings from Last 1 Encounters:  11/26/15 180 lb 14.4 oz (82.1 kg)    Ideal Body Weight:  80.9 kg  BMI:  Body mass index is 24.53 kg/m.  Estimated Nutritional Needs:   Kcal:  2250-2450  Protein:  110-120g  Fluid:  2.2-2.4L/day  EDUCATION NEEDS:   No education needs identified at this time  Tilda FrancoLindsey Cinthia Rodden, MS, RD, LDN Pager: [phone] After Hours Pager: [phone]

## 2015-11-30 NOTE — Progress Notes (Addendum)
[facility]  [address] [place], [place] [address] Phone: [phone] FAX: [phone]   [name] [id number] December 07, 1940  CARE TEAM:  PCP: Irven Shelling, MD  Outpatient Care Team: Patient Care Team: Lavone Orn, MD as PCP - General (Internal Medicine) Michael Boston, MD as Consulting Physician (General Surgery) Arta Silence, MD as Consulting Physician (Gastroenterology)  Inpatient Treatment Team: Treatment Team: Attending Provider: Michael Boston, MD; Consulting Physician: Arta Silence, MD; Technician: Sueanne Margarita, NT; Technician: Abbe Amsterdam, NT; Registered Nurse: Mertha Baars, RN; Technician: Raylene Everts, NT; Registered Nurse: Arnold Long, RN; Technician: Leda Quail, NT  Problem List:   Principal Problem:   Gastric outlet obstruction Active Problems:   Essential hypertension, benign   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 09/25/2015   Anxiety state   Gastric Ileus    Protein-calorie malnutrition, moderate ([place])     Assessment/Plan:  GASTRIC ILEUS with presumed recurrent stricture most likely inflammatory in nature and at the gastroduodenal anastomosis.  NG tube  Continue IV PPI, H2 blocker Reglan, erythromycin to stimulate motility   If not opened up by 10/17:  EGD with Removal of any remaining solid material and probable balloon dilation.  Do in the operating room and do diagnostic laparoscopy and air leak test to make sure I had not perforated. Place a gastrostomy tube as well and probable feeding jejunostomy tube.  Hopefully could then transitioned to tube feeds and wean off IV nutrition.    If no improvement after serial balloon dilations over next 6-12 weeks, may also require gastrojejunostomy loop Bilroth II with possible gastric reresection.  I would like to hold off until we've proven that the Bilroth I gastroduodenostomy is nonsalvageable and his  nutrition is normal.  Discussed with patient and wife & daughter the risks. They understand and agree with this plan:  The anatomy & physiology of the foregut and digestive tract was discussed.  Natural history risks without surgery was discussed.   The patient's situation is not adequately controlled by nonoperative interventions by gastroenterology and interventional radiology.  I feel the risks of no intervention will lead to serious problems that outweigh the operative risks; therefore, I recommended surgery to place a feeding tube into the stomach. Dilated at the gastroduodenal anastomosis.Need for a thorough workup to rule out the differential diagnosis and plan treatment was explained.  I explained laparoscopic techniques with possible need for an open approach.  Risks such as bleeding, infection, abscess, leak, injury to other organs, need for repair of tissues / organs, need for further treatment, heart attack, death, and other risks were discussed.   I noted a good likelihood this will help address the problem.  Goals of post-operative recovery were discussed as well.  Possibility that this will not correct all symptoms was explained.  We will work to minimize complications.   Educational handouts further explaining the pathology, treatment options, and dysphagia diet was given as well.  Questions were answered.  The patient expresses understanding & wishes to proceed with surgery.    IV TNA nutrition in the meantime since he has a prolonged ileus.  Follow LFTs - slight inc but not severe - d/w pharmacy  Anxiolysis - valium working  HTN control  Try enema to empty constipated stool & stimulate bowel function   VTE prophylaxis - SCDs  Mobilize as tolerated to help recovery   [name], M.D., F.A.C.S. Gastrointestinal and Minimally Invasive Surgery [facility] [address], [place], [place] [address] [phone] Main /  Paging    11/30/2015   Subjective:  Denies abdominal pain or discomfort  Urinating without difficulty Tolerating NG tube but nares/throat sore States flatus Daughter in room  Objective:  Vital signs:  Vitals:   11/29/15 0522 11/29/15 1334 11/29/15 2142 11/30/15 0638  BP:  123/78 (!) 152/59 (!) 131/34  Pulse: 60 (!) 57 69 61  Resp:  _0 Temp:  98 F (36.7 C) 98.2 F (36.8 C) 98.7 F (37.1 C)  TempSrc:  Oral Oral Oral  SpO2:  98% 98% 99%  Weight:      Height:        Last BM Date: 11/25/15  Intake/Output   Yesterday:  10/15 0701 - 10/16 0700 In: 2403.6 [I.V.:2303.6; IV Piggyback:100] Out: 1625 [Urine:375; Emesis/NG output:1250] This shift:  No intake/output data recorded.  Bowel function:  Flatus: No  BM:  No  Drain: Thin bilious.  NGT flushes more easily & less cloudy today   Physical Exam:  General: Pt awake/alert/oriented x4 in No acute distress Eyes: PERRL, normal EOM.  Sclera clear.  No icterus Neuro: CN II-XII intact w/o focal sensory/motor deficits. Lymph: No head/neck/groin lymphadenopathy Psych:  No delerium/psychosis/paranoia HENT: Normocephalic, Mucus membranes moist.  No thrush Neck: Supple, No tracheal deviation Chest: CTAB. Chest wall pain w good excursion CV:  Pulses intact.  Regular rhythm MS: Normal AROM mjr joints.  No obvious deformity Abdomen: Soft.  Nondistended.  Nontender.  No evidence of peritonitis.  Diminished bowel sounds (difficult to appreciate over NGT suction). Ext:  SCDs BLE.  No mjr edema.  No cyanosis Skin: No petechiae / purpura  Results:   Labs: Results for orders placed or performed during the hospital encounter of 11/24/15 (from the past 48 hour(s))  Glucose, capillary     Status: Abnormal   Collection Time: 11/28/15  4:09 PM  Result Value Ref Range   Glucose-Capillary 119 (H) 65 - 99 mg/dL  Comprehensive metabolic panel     Status: Abnormal   Collection Time: 11/29/15  4:45 AM  Result Value Ref  Range   Sodium 135 135 - 145 mmol/L   Potassium 3.7 3.5 - 5.1 mmol/L   Chloride 106 101 - 111 mmol/L   CO2 26 22 - 32 mmol/L   Glucose, Bld 116 (H) 65 - 99 mg/dL   BUN 18 6 - 20 mg/dL   Creatinine, Ser 0.62 0.61 - 1.24 mg/dL   Calcium 8.2 (L) 8.9 - 10.3 mg/dL   Total Protein 6.0 (L) 6.5 - 8.1 g/dL   Albumin 2.9 (L) 3.5 - 5.0 g/dL   AST 210 (H) 15 - 41 U/L   ALT 384 (H) 17 - 63 U/L   Alkaline Phosphatase 142 (H) 38 - 126 U/L   Total Bilirubin 0.6 0.3 - 1.2 mg/dL   GFR calc non Af Amer >60 >60 mL/min   GFR calc Af Amer >60 >60 mL/min    Comment: (NOTE) The eGFR has been calculated using the CKD EPI equation. This calculation has not been validated in all clinical situations. eGFR's persistently <60 mL/min signify possible Chronic Kidney Disease.    Anion gap 3 (L) 5 - 15  Glucose, capillary     Status: Abnormal   Collection Time: 11/29/15  7:53 AM  Result Value Ref Range   Glucose-Capillary 126 (H) 65 - 99 mg/dL  Glucose, capillary     Status: Abnormal  Collection Time: 11/29/15  4:30 PM  Result Value Ref Range   Glucose-Capillary 125 (H) 65 - 99 mg/dL  Comprehensive metabolic panel     Status: Abnormal   Collection Time: 11/30/15  5:25 AM  Result Value Ref Range   Sodium 136 135 - 145 mmol/L   Potassium 4.1 3.5 - 5.1 mmol/L   Chloride 107 101 - 111 mmol/L   CO2 23 22 - 32 mmol/L   Glucose, Bld 108 (H) 65 - 99 mg/dL   BUN 21 (H) 6 - 20 mg/dL   Creatinine, Ser 0.61 0.61 - 1.24 mg/dL   Calcium 8.4 (L) 8.9 - 10.3 mg/dL   Total Protein 6.0 (L) 6.5 - 8.1 g/dL   Albumin 2.9 (L) 3.5 - 5.0 g/dL   AST 86 (H) 15 - 41 U/L   ALT 236 (H) 17 - 63 U/L   Alkaline Phosphatase 121 38 - 126 U/L   Total Bilirubin 0.6 0.3 - 1.2 mg/dL   GFR calc non Af Amer >60 >60 mL/min   GFR calc Af Amer >60 >60 mL/min    Comment: (NOTE) The eGFR has been calculated using the CKD EPI equation. This calculation has not been validated in all clinical situations. eGFR's persistently <60 mL/min signify  possible Chronic Kidney Disease.    Anion gap 6 5 - 15  Magnesium     Status: None   Collection Time: 11/30/15  5:25 AM  Result Value Ref Range   Magnesium 1.9 1.7 - 2.4 mg/dL  Phosphorus     Status: None   Collection Time: 11/30/15  5:25 AM  Result Value Ref Range   Phosphorus 3.7 2.5 - 4.6 mg/dL  CBC     Status: Abnormal   Collection Time: 11/30/15  5:25 AM  Result Value Ref Range   WBC 6.7 4.0 - 10.5 K/uL   RBC 3.72 (L) 4.22 - 5.81 MIL/uL   Hemoglobin 11.2 (L) 13.0 - 17.0 g/dL   HCT 32.8 (L) 39.0 - 52.0 %   MCV 88.2 78.0 - 100.0 fL   MCH 30.1 26.0 - 34.0 pg   MCHC 34.1 30.0 - 36.0 g/dL   RDW 12.7 11.5 - 15.5 %   Platelets 253 150 - 400 K/uL  Differential     Status: Abnormal   Collection Time: 11/30/15  5:25 AM  Result Value Ref Range   Neutrophils Relative % 47 %   Neutro Abs 3.1 1.7 - 7.7 K/uL   Lymphocytes Relative 30 %   Lymphs Abs 2.0 0.7 - 4.0 K/uL   Monocytes Relative 18 %   Monocytes Absolute 1.2 (H) 0.1 - 1.0 K/uL   Eosinophils Relative 4 %   Eosinophils Absolute 0.3 0.0 - 0.7 K/uL   Basophils Relative 1 %   Basophils Absolute 0.1 0.0 - 0.1 K/uL    Imaging / Studies: Dg Abd Acute W/chest  Result Date: 11/29/2015 CLINICAL DATA:  Abdominal distension. EXAM: DG ABDOMEN ACUTE W/ 1V CHEST COMPARISON:  CT scan November 25, 2015 FINDINGS: A right PICC line terminates in the central SVC. No pneumothorax. The heart, hila, mediastinum, lungs, and pleura are normal. No free air, portal venous gas, or pneumatosis. The NG tube terminates in the gastric antrum. The stomach is distended with air and fluid. Previous cholecystectomy. No small bowel or colonic dilatation to suggest obstruction. No other acute abnormalities. IMPRESSION: 1. The stomach remains distended with air and fluid despite the NG tube. Electronically Signed   By: Dorise Bullion III M.D  On: 11/29/2015 10:24    Medications / Allergies: per chart  Antibiotics: Anti-infectives    Start     Dose/Rate  Route Frequency Ordered Stop   11/27/15 1200  erythromycin 500 mg in sodium chloride 0.9 % 100 mL IVPB     500 mg 100 mL/hr over 60 Minutes Intravenous Every 6 hours 11/27/15 0940 11/29/15 0608   11/26/15 0830  erythromycin 250 mg in sodium chloride 0.9 % 100 mL IVPB  Status:  Discontinued     250 mg 100 mL/hr over 60 Minutes Intravenous Every 6 hours 11/26/15 0751 11/27/15 0940        Note: Portions of this report may have been transcribed using voice recognition software. Every effort was made to ensure accuracy; however, inadvertent computerized transcription errors may be present.   Any transcriptional errors that result from this process are unintentional.    [name], M.D., F.A.C.S. Gastrointestinal and Minimally Invasive Surgery [facility] [address], [place], [place] [address] [phone] Main / Paging     11/30/2015

## 2015-12-01 ENCOUNTER — Encounter (HOSPITAL_COMMUNITY): Payer: Self-pay | Admitting: Certified Registered Nurse Anesthetist

## 2015-12-01 ENCOUNTER — Inpatient Hospital Stay (HOSPITAL_COMMUNITY): Payer: Medicare Other | Admitting: Certified Registered Nurse Anesthetist

## 2015-12-01 ENCOUNTER — Encounter (HOSPITAL_COMMUNITY)
Admission: AD | Disposition: A | Discharge status: Skilled Nursing Facility | Payer: Self-pay | Source: Ambulatory Visit | Attending: Surgery

## 2015-12-01 DIAGNOSIS — E43 Unspecified severe protein-calorie malnutrition: Secondary | ICD-10-CM | POA: Insufficient documentation

## 2015-12-01 HISTORY — PX: ESOPHAGOGASTRODUODENOSCOPY: SHX5428

## 2015-12-01 HISTORY — PX: LAPAROSCOPIC GASTROSTOMY: SHX5896

## 2015-12-01 LAB — CREATININE, SERUM
Creatinine, Ser: 0.7 mg/dL (ref 0.61–1.24)
GFR calc non Af Amer: >60 mL/min (ref >60)

## 2015-12-01 SURGERY — CREATION, GASTROSTOMY, LAPAROSCOPIC
Anesthesia: General | Site: Abdomen

## 2015-12-01 MED ORDER — OMEPRAZOLE 2 MG/ML ORAL SUSPENSION: 40.0000 mg | Freq: Two times a day (BID) | ORAL | Status: DC

## 2015-12-01 MED ORDER — RANITIDINE HCL 150 MG/10ML PO SYRP
150.0000 mg | ORAL_SOLUTION | Freq: Two times a day (BID) | ORAL | Status: DC
  Filled 2015-12-01: qty 10

## 2015-12-01 MED ORDER — EPHEDRINE 5 MG/ML INJ
INTRAVENOUS | Status: AC
  Filled 2015-12-01: qty 10

## 2015-12-01 MED ORDER — FENTANYL CITRATE (PF) 250 MCG/5ML IJ SOLN
INTRAMUSCULAR | Status: AC
  Filled 2015-12-01: qty 5

## 2015-12-01 MED ORDER — ONDANSETRON HCL 4 MG/2ML IJ SOLN
INTRAMUSCULAR | Status: DC | PRN
  Administered 2015-12-01: 4 mg via INTRAVENOUS

## 2015-12-01 MED ORDER — FAT EMULSION 20 % IV EMUL
250.0000 mL | INTRAVENOUS | Status: DC
  Filled 2015-12-01: qty 250

## 2015-12-01 MED ORDER — HYDROMORPHONE HCL 1 MG/ML IJ SOLN
INTRAMUSCULAR | Status: AC
  Filled 2015-12-01: qty 1

## 2015-12-01 MED ORDER — FAT EMULSION 20 % INFUSION TNA - OPTIME
INTRAVENOUS | Status: DC | PRN
  Administered 2015-12-01: 10 mL/h via INTRAVENOUS

## 2015-12-01 MED ORDER — LACTATED RINGERS IR SOLN
Status: DC | PRN
  Administered 2015-12-01: 2000 mL
  Administered 2015-12-01: 17:00:00

## 2015-12-01 MED ORDER — EPHEDRINE SULFATE 50 MG/ML IJ SOLN
INTRAMUSCULAR | Status: DC | PRN
  Administered 2015-12-01: 5 mg via INTRAVENOUS

## 2015-12-01 MED ORDER — DEXTROSE 10 % IV SOLN
INTRAVENOUS | Status: DC
  Administered 2015-12-01: 19:00:00 via INTRAVENOUS
  Filled 2015-12-01 (×3): qty 1000

## 2015-12-01 MED ORDER — SODIUM CHLORIDE 0.9 % IJ SOLN
INTRAMUSCULAR | Status: AC
  Filled 2015-12-01: qty 20

## 2015-12-01 MED ORDER — PROMETHAZINE HCL 25 MG/ML IJ SOLN: 6.2500 mg | INTRAMUSCULAR | Status: DC | PRN

## 2015-12-01 MED ORDER — LIDOCAINE HCL (CARDIAC) 20 MG/ML IV SOLN
INTRAVENOUS | Status: DC | PRN
  Administered 2015-12-01: 100 mg via INTRAVENOUS

## 2015-12-01 MED ORDER — LACTATED RINGERS IV SOLN
INTRAVENOUS | Status: DC
  Administered 2015-12-01: 23:00:00 via INTRAVENOUS

## 2015-12-01 MED ORDER — BUPIVACAINE HCL 0.5 % IJ SOLN
INTRAMUSCULAR | Status: DC | PRN
  Administered 2015-12-01: 30 mL

## 2015-12-01 MED ORDER — ROCURONIUM BROMIDE 100 MG/10ML IV SOLN
INTRAVENOUS | Status: DC | PRN
  Administered 2015-12-01 (×3): 10 mg via INTRAVENOUS
  Administered 2015-12-01: 35 mg via INTRAVENOUS

## 2015-12-01 MED ORDER — FENTANYL CITRATE (PF) 100 MCG/2ML IJ SOLN
INTRAMUSCULAR | Status: AC
  Filled 2015-12-01: qty 2

## 2015-12-01 MED ORDER — LIDOCAINE 2% (20 MG/ML) 5 ML SYRINGE
INTRAMUSCULAR | Status: AC
  Filled 2015-12-01: qty 5

## 2015-12-01 MED ORDER — CHLORHEXIDINE GLUCONATE CLOTH 2 % EX PADS: 6.0000 | MEDICATED_PAD | Freq: Once | CUTANEOUS | Status: DC

## 2015-12-01 MED ORDER — HYDROMORPHONE HCL 1 MG/ML IJ SOLN
INTRAMUSCULAR | Status: AC
  Administered 2015-12-02: 0.5 mg via INTRAVENOUS
  Filled 2015-12-01: qty 1

## 2015-12-01 MED ORDER — METOCLOPRAMIDE HCL 5 MG/5ML PO SOLN
5.0000 mg | Freq: Four times a day (QID) | ORAL | Status: DC
  Administered 2015-12-02 (×3): 5 mg
  Filled 2015-12-01 (×6): qty 5

## 2015-12-01 MED ORDER — FAMOTIDINE IN NACL 20-0.9 MG/50ML-% IV SOLN
20.0000 mg | Freq: Two times a day (BID) | INTRAVENOUS | Status: DC
  Administered 2015-12-01 – 2015-12-03 (×4): 20 mg via INTRAVENOUS
  Filled 2015-12-01 (×3): qty 50

## 2015-12-01 MED ORDER — MIDAZOLAM HCL 2 MG/2ML IJ SOLN: 0.5000 mg | Freq: Once | INTRAMUSCULAR | Status: DC

## 2015-12-01 MED ORDER — METRONIDAZOLE IN NACL 5-0.79 MG/ML-% IV SOLN
INTRAVENOUS | Status: DC | PRN
  Administered 2015-12-01: 500 mg via INTRAVENOUS

## 2015-12-01 MED ORDER — DEXAMETHASONE SODIUM PHOSPHATE 10 MG/ML IJ SOLN
INTRAMUSCULAR | Status: AC
  Filled 2015-12-01: qty 1

## 2015-12-01 MED ORDER — MIDAZOLAM HCL 2 MG/2ML IJ SOLN
INTRAMUSCULAR | Status: AC
  Filled 2015-12-01: qty 2

## 2015-12-01 MED ORDER — ONDANSETRON HCL 4 MG/2ML IJ SOLN
INTRAMUSCULAR | Status: AC
  Filled 2015-12-01: qty 2

## 2015-12-01 MED ORDER — PROPOFOL 10 MG/ML IV BOLUS
INTRAVENOUS | Status: AC
  Filled 2015-12-01: qty 20

## 2015-12-01 MED ORDER — PANTOPRAZOLE SODIUM 40 MG PO PACK
40.0000 mg | PACK | Freq: Two times a day (BID) | ORAL | Status: DC
  Administered 2015-12-02 – 2015-12-03 (×3): 40 mg
  Filled 2015-12-01 (×5): qty 20

## 2015-12-01 MED ORDER — TRACE MINERALS CR-CU-MN-SE-ZN 10-1000-500-60 MCG/ML IV SOLN
INTRAVENOUS | Status: DC
Start: 2015-12-01 — End: 2015-12-01
  Filled 2015-12-01: qty 1992

## 2015-12-01 MED ORDER — ESMOLOL HCL 100 MG/10ML IV SOLN
INTRAVENOUS | Status: DC | PRN
  Administered 2015-12-01: 10 mg via INTRAVENOUS
  Administered 2015-12-01: 20 mg via INTRAVENOUS
  Administered 2015-12-01: 10 mg via INTRAVENOUS

## 2015-12-01 MED ORDER — ROCURONIUM BROMIDE 10 MG/ML (PF) SYRINGE
PREFILLED_SYRINGE | INTRAVENOUS | Status: AC
  Filled 2015-12-01: qty 10

## 2015-12-01 MED ORDER — BUPIVACAINE LIPOSOME 1.3 % IJ SUSP
20.0000 mL | INTRAMUSCULAR | Status: DC
  Filled 2015-12-01: qty 20

## 2015-12-01 MED ORDER — FENTANYL CITRATE (PF) 100 MCG/2ML IJ SOLN
INTRAMUSCULAR | Status: DC | PRN
  Administered 2015-12-01: 100 ug via INTRAVENOUS
  Administered 2015-12-01 (×2): 50 ug via INTRAVENOUS
  Administered 2015-12-01: 25 ug via INTRAVENOUS
  Administered 2015-12-01 (×6): 50 ug via INTRAVENOUS
  Administered 2015-12-01: 25 ug via INTRAVENOUS
  Administered 2015-12-01: 50 ug via INTRAVENOUS

## 2015-12-01 MED ORDER — TRACE MINERALS CR-CU-MN-SE-ZN 10-1000-500-60 MCG/ML IV SOLN
INTRAVENOUS | Status: AC
  Administered 2015-12-02: 08:00:00 via INTRAVENOUS
  Filled 2015-12-01: qty 1992

## 2015-12-01 MED ORDER — LACTATED RINGERS IV SOLN
INTRAVENOUS | Status: DC | PRN
  Administered 2015-12-01 (×2): via INTRAVENOUS

## 2015-12-01 MED ORDER — CEFAZOLIN SODIUM-DEXTROSE 2-4 GM/100ML-% IV SOLN
INTRAVENOUS | Status: AC
  Filled 2015-12-01: qty 100

## 2015-12-01 MED ORDER — SUGAMMADEX SODIUM 200 MG/2ML IV SOLN
INTRAVENOUS | Status: AC
  Filled 2015-12-01: qty 2

## 2015-12-01 MED ORDER — HYDROMORPHONE HCL 1 MG/ML IJ SOLN
0.2500 mg | INTRAMUSCULAR | Status: DC | PRN
  Administered 2015-12-01: 0.5 mg via INTRAVENOUS
  Administered 2015-12-01 (×2): 0.25 mg via INTRAVENOUS
  Administered 2015-12-01 (×2): 0.5 mg via INTRAVENOUS
  Administered 2015-12-01: 0.25 mg via INTRAVENOUS
  Administered 2015-12-01: 0.5 mg via INTRAVENOUS

## 2015-12-01 MED ORDER — SUGAMMADEX SODIUM 200 MG/2ML IV SOLN
INTRAVENOUS | Status: DC | PRN
  Administered 2015-12-01: 200 mg via INTRAVENOUS

## 2015-12-01 MED ORDER — STERILE WATER FOR IRRIGATION IR SOLN
Status: DC | PRN
  Administered 2015-12-01: 1500 mL

## 2015-12-01 MED ORDER — DEXAMETHASONE SODIUM PHOSPHATE 10 MG/ML IJ SOLN
INTRAMUSCULAR | Status: DC | PRN
  Administered 2015-12-01: 10 mg via INTRAVENOUS

## 2015-12-01 MED ORDER — METRONIDAZOLE IN NACL 5-0.79 MG/ML-% IV SOLN
INTRAVENOUS | Status: AC
  Filled 2015-12-01: qty 100

## 2015-12-01 MED ORDER — LABETALOL HCL 5 MG/ML IV SOLN
INTRAVENOUS | Status: DC | PRN
  Administered 2015-12-01 (×3): 2.5 mg via INTRAVENOUS
  Administered 2015-12-01: 5 mg via INTRAVENOUS
  Administered 2015-12-01: 2.5 mg via INTRAVENOUS
  Administered 2015-12-01: 5 mg via INTRAVENOUS

## 2015-12-01 MED ORDER — ERYTHROMYCIN ETHYLSUCCINATE 200 MG/5ML PO SUSR
400.0000 mg | Freq: Four times a day (QID) | ORAL | Status: DC
Start: 2015-12-02 — End: 2015-12-03
  Administered 2015-12-02 – 2015-12-03 (×4): 400 mg via ORAL
  Filled 2015-12-01: qty 5
  Filled 2015-12-01 (×2): qty 10
  Filled 2015-12-01: qty 5
  Filled 2015-12-01 (×2): qty 10

## 2015-12-01 MED ORDER — FAMOTIDINE 40 MG/5ML PO SUSR: 40.0000 mg | Freq: Two times a day (BID) | ORAL | Status: DC

## 2015-12-01 MED ORDER — SUCCINYLCHOLINE CHLORIDE 20 MG/ML IJ SOLN
INTRAMUSCULAR | Status: DC | PRN
  Administered 2015-12-01: 120 mg via INTRAVENOUS

## 2015-12-01 MED ORDER — SODIUM CHLORIDE 0.9 % IJ SOLN
INTRAMUSCULAR | Status: AC
  Filled 2015-12-01: qty 50

## 2015-12-01 MED ORDER — CEFAZOLIN SODIUM-DEXTROSE 2-3 GM-% IV SOLR
INTRAVENOUS | Status: DC | PRN
  Administered 2015-12-01: 2 g via INTRAVENOUS

## 2015-12-01 MED ORDER — LABETALOL HCL 5 MG/ML IV SOLN
INTRAVENOUS | Status: AC
  Filled 2015-12-01: qty 4

## 2015-12-01 MED ORDER — FAT EMULSION 20 % IV EMUL
240.0000 mL | INTRAVENOUS | Status: AC
  Administered 2015-12-02: 240 mL via INTRAVENOUS
  Filled 2015-12-01: qty 250

## 2015-12-01 MED ORDER — LIP MEDEX EX OINT
TOPICAL_OINTMENT | CUTANEOUS | Status: AC
  Filled 2015-12-01: qty 7

## 2015-12-01 MED ORDER — PROPOFOL 10 MG/ML IV BOLUS
INTRAVENOUS | Status: DC | PRN
  Administered 2015-12-01: 150 mg via INTRAVENOUS

## 2015-12-01 SURGICAL SUPPLY — 70 items
APPLIER CLIP ROT 10 11.4 M/L (STAPLE)
APPLIER CLIP UNV 5X34 EPIX (ENDOMECHANICALS) ×1 IMPLANT
APR CLP MED LRG 11.4X10 (STAPLE)
APR XCLPCLP 20M/L UNV 34X5 (ENDOMECHANICALS)
BAG SPEC RTRVL LRG 6X4 10 (ENDOMECHANICALS)
CABLE HIGH FREQUENCY MONO STRZ (ELECTRODE) ×3 IMPLANT
CATH GASTROSTOMY 24FR (CATHETERS) ×3 IMPLANT
CELLS DAT CNTRL 66122 CELL SVR (MISCELLANEOUS) IMPLANT
CHLORAPREP W/TINT 26ML (MISCELLANEOUS) ×3 IMPLANT
CLIP APPLIE ROT 10 11.4 M/L (STAPLE) IMPLANT
COVER MAYO STAND STRL (DRAPES) IMPLANT
COVER SURGICAL LIGHT HANDLE (MISCELLANEOUS) ×1 IMPLANT
DEVICE TROCAR PUNCTURE CLOSURE (ENDOMECHANICALS) ×2 IMPLANT
DRAPE LAPAROSCOPIC ABDOMINAL (DRAPES) ×3 IMPLANT
DRAPE LG THREE QUARTER DISP (DRAPES) ×2 IMPLANT
DRAPE UTILITY XL STRL (DRAPES) ×3 IMPLANT
DRAPE WARM FLUID 44X44 (DRAPE) ×3 IMPLANT
DRSG TEGADERM 2-3/8X2-3/4 SM (GAUZE/BANDAGES/DRESSINGS) ×9 IMPLANT
DRSG TEGADERM 4X4.75 (GAUZE/BANDAGES/DRESSINGS) IMPLANT
ELECT PENCIL ROCKER SW 15FT (MISCELLANEOUS) IMPLANT
ELECT REM PT RETURN 9FT ADLT (ELECTROSURGICAL)
ELECTRODE REM PT RTRN 9FT ADLT (ELECTROSURGICAL) IMPLANT
ENDOLOOP SUT PDS II  0 18 (SUTURE)
ENDOLOOP SUT PDS II 0 18 (SUTURE) IMPLANT
G-TUBE MIC 24F 7-10 BALLOON (CATHETERS) IMPLANT
GAUZE SPONGE 2X2 8PLY STRL LF (GAUZE/BANDAGES/DRESSINGS) ×1 IMPLANT
GAUZE SPONGE 4X4 12PLY STRL (GAUZE/BANDAGES/DRESSINGS) IMPLANT
GLOVE ECLIPSE 8.0 STRL XLNG CF (GLOVE) ×7 IMPLANT
GLOVE INDICATOR 8.0 STRL GRN (GLOVE) ×7 IMPLANT
IRRIG SUCT STRYKERFLOW 2 WTIP (MISCELLANEOUS) ×3
IRRIGATION SUCT STRKRFLW 2 WTP (MISCELLANEOUS) ×1 IMPLANT
KIT BASIN OR (CUSTOM PROCEDURE TRAY) ×3 IMPLANT
PAD POSITIONING PINK XL (MISCELLANEOUS) ×3 IMPLANT
POSITIONER SURGICAL ARM (MISCELLANEOUS) ×3 IMPLANT
POUCH SPECIMEN RETRIEVAL 10MM (ENDOMECHANICALS) IMPLANT
RELOAD EGIA 60 MED/THCK PURPLE (STAPLE) IMPLANT
RELOAD STAPLE 60 3.6 BLU REG (STAPLE) IMPLANT
RELOAD STAPLE 60 3.8 GOLD REG (STAPLE) IMPLANT
RELOAD STAPLE 60 4.1 GRN THCK (STAPLE) IMPLANT
RELOAD STAPLE 60 MED/THCK ART (STAPLE) IMPLANT
RELOAD STAPLER BLUE 60MM (STAPLE) IMPLANT
RELOAD STAPLER GOLD 60MM (STAPLE) IMPLANT
RELOAD STAPLER GREEN 60MM (STAPLE) IMPLANT
RETRACTOR WND ALEXIS 18 MED (MISCELLANEOUS) IMPLANT
RTRCTR WOUND ALEXIS 18CM MED (MISCELLANEOUS)
SCISSORS LAP 5X35 DISP (ENDOMECHANICALS) ×3 IMPLANT
SHEARS HARMONIC ACE PLUS 36CM (ENDOMECHANICALS) IMPLANT
SLEEVE XCEL OPT CAN 5 100 (ENDOMECHANICALS) ×6 IMPLANT
SPONGE DRAIN TRACH 4X4 STRL 2S (GAUZE/BANDAGES/DRESSINGS) ×4 IMPLANT
SPONGE GAUZE 2X2 STER 10/PKG (GAUZE/BANDAGES/DRESSINGS) ×2
SPONGE LAP 18X18 X RAY DECT (DISPOSABLE) IMPLANT
STAPLE ECHEON FLEX 60 POW ENDO (STAPLE) IMPLANT
STAPLER RELOAD BLUE 60MM (STAPLE)
STAPLER RELOAD GOLD 60MM (STAPLE)
STAPLER RELOAD GREEN 60MM (STAPLE)
STAPLER VISISTAT 35W (STAPLE) IMPLANT
SUT MNCRL AB 4-0 PS2 18 (SUTURE) ×3 IMPLANT
SUT PDS AB 2-0 CT2 27 (SUTURE) ×10 IMPLANT
SUT PDS AB 3-0 SH 27 (SUTURE) ×10 IMPLANT
SUT PROLENE 2 0 SH DA (SUTURE) ×6 IMPLANT
SUT SILK 2 0 SH (SUTURE) ×2 IMPLANT
TAPE UMBILICAL COTTON 1/8X30 (MISCELLANEOUS) IMPLANT
TOWEL OR 17X26 10 PK STRL BLUE (TOWEL DISPOSABLE) ×3 IMPLANT
TRAY FOLEY W/METER SILVER 16FR (SET/KITS/TRAYS/PACK) IMPLANT
TRAY LAPAROSCOPIC (CUSTOM PROCEDURE TRAY) ×3 IMPLANT
TROCAR ADV FIXATION 5X100MM (TROCAR) ×2 IMPLANT
TROCAR BLADELESS OPT 5 100 (ENDOMECHANICALS) ×3 IMPLANT
TROCAR XCEL NON-BLD 11X100MML (ENDOMECHANICALS) IMPLANT
TUBE GASTROSTOMY ENTUIT 16FR (BALLOONS) ×2 IMPLANT
TUBING INSUF HEATED (TUBING) ×3 IMPLANT

## 2015-12-01 NOTE — Anesthesia Procedure Notes (Addendum)
Procedure Name: Intubation Date/Time: 12/01/2015 1:11 PM Performed by: Orest DikesPETERS, Zoriyah Scheidegger J Pre-anesthesia Checklist: Patient identified, Emergency Drugs available, Suction available and Patient being monitored Patient Re-evaluated:Patient Re-evaluated prior to inductionOxygen Delivery Method: Circle system utilized Preoxygenation: Pre-oxygenation with 100% oxygen Intubation Type: IV induction, Rapid sequence and Cricoid Pressure applied Laryngoscope Size: Glidescope and 4 Grade View: Grade I Tube type: Oral Tube size: 7.5 mm Number of attempts: 1 Airway Equipment and Method: Stylet Placement Confirmation: ETT inserted through vocal cords under direct vision,  positive ETCO2 and breath sounds checked- equal and bilateral Secured at: 21 cm Tube secured with: Tape Dental Injury: Teeth and Oropharynx as per pre-operative assessment  Difficulty Due To: Difficulty was anticipated, Difficult Airway- due to limited oral opening and Difficult Airway- due to anterior larynx Comments: Due to prior documentation of use of Glidescope, electively chose to proceed with use of Glidescope. RSI with DL X1 with #4 with Grade 1 view. ETT passed with ease.

## 2015-12-01 NOTE — Anesthesia Postprocedure Evaluation (Signed)
Anesthesia Post Note  Patient: [name]  Procedure(s) Performed: Procedure(s) (LRB): LAPAROSCOPIC PLACEMENT OF FEEDING JEJUNOSTOMY AND GASTROSTOMY TUBE (N/A) ESOPHAGOGASTRODUODENOSCOPY (EGD) BALLOON DILATION OF DUODENAL STRICTURE (N/A)  Patient location during evaluation: PACU Anesthesia Type: General Level of consciousness: sedated Pain management: pain level controlled Vital Signs Assessment: post-procedure vital signs reviewed and stable Respiratory status: spontaneous breathing and respiratory function stable Cardiovascular status: stable Anesthetic complications: no    Last Vitals:  Vitals:   12/01/15 1730 12/01/15 1745  BP: (!) 164/51 (!) 144/64  Pulse: (!) 112 67  Resp: 12 10  Temp:      Last Pain:  Vitals:   12/01/15 1745  TempSrc:   PainSc: Asleep                 Gilliam Hawkes [name]

## 2015-12-01 NOTE — Anesthesia Preprocedure Evaluation (Addendum)
Anesthesia Evaluation  Patient identified by MRN, date of birth, ID band Patient awake    Reviewed: Allergy & Precautions, NPO status , Patient's Chart, lab work & pertinent test results  Airway Mallampati: III  TM Distance: <3 FB Neck ROM: Full  Mouth opening: Limited Mouth Opening  Dental no notable dental hx.    Pulmonary neg pulmonary ROS, former smoker,    Pulmonary exam normal breath sounds clear to auscultation       Cardiovascular hypertension, + CAD and + Cardiac Stents  Normal cardiovascular exam Rhythm:Regular Rate:Normal     Neuro/Psych negative neurological ROS  negative psych ROS   GI/Hepatic Neg liver ROS, PUD, GERD  ,  Endo/Other  negative endocrine ROS  Renal/GU negative Renal ROS  negative genitourinary   Musculoskeletal negative musculoskeletal ROS (+)   Abdominal   Peds negative pediatric ROS (+)  Hematology negative hematology ROS (+)   Anesthesia Other Findings   Reproductive/Obstetrics negative OB ROS                            Anesthesia Physical Anesthesia Plan  ASA: III  Anesthesia Plan: General   Post-op Pain Management:    Induction: Intravenous and Rapid sequence  Airway Management Planned: Oral ETT and Video Laryngoscope Planned  Additional Equipment:   Intra-op Plan:   Post-operative Plan: Extubation in OR  Informed Consent: I have reviewed the patients History and Physical, chart, labs and discussed the procedure including the risks, benefits and alternatives for the proposed anesthesia with the patient or authorized representative who has indicated his/her understanding and acceptance.   Dental advisory given  Plan Discussed with: CRNA and Surgeon  Anesthesia Plan Comments:        Anesthesia Quick Evaluation

## 2015-12-01 NOTE — Transfer of Care (Signed)
Immediate Anesthesia Transfer of Care Note  Patient: [name]  Procedure(s) Performed: Procedure(s): LAPAROSCOPIC PLACEMENT OF FEEDING JEJUNOSTOMY AND GASTROSTOMY TUBE (N/A) ESOPHAGOGASTRODUODENOSCOPY (EGD) BALLOON DILATION OF DUODENAL STRICTURE (N/A)  Patient Location: PACU  Anesthesia Type:General  Level of Consciousness: awake, pateint uncooperative, confused and responds to stimulation  Airway & Oxygen Therapy: Patient Spontanous Breathing and Patient connected to face mask oxygen  Post-op Assessment: Report given to RN, Post -op Vital signs reviewed and stable and Patient moving all extremities X 4  Post vital signs: stable  Last Vitals:  Vitals:   12/01/15 0451 12/01/15 1630  BP: (!) 147/48 (!) 189/98  Pulse: 66 76  Resp: 18 11  Temp: 37.1 C     Last Pain:  Vitals:   12/01/15 0451  TempSrc: Oral  PainSc:       Patients Stated Pain Goal: 0 (11/28/15 1930)  Complications: No apparent anesthesia complications

## 2015-12-01 NOTE — Progress Notes (Signed)
PHARMACY - ADULT TOTAL PARENTERAL NUTRITION CONSULT NOTE   Pharmacy Consult for TPN Indication: Gastric ileus / obstruction  Patient Measurements: Height: 6' (182.9 cm) Weight: 180 lb 14.4 oz (82.1 kg) IBW/kg (Calculated) : 77.6 TPN AdjBW (KG): 81.4 Body mass index is 24.53 kg/m. Usual Weight:   Insulin Requirements: 4 units yesterday  Current Nutrition: NPO  IVF: D5W + 1/2NS with KCl 55mEq/L at 20 ml/hr  Central access: PICC placed 10/12 TPN start date: 10/12  ASSESSMENT                                                                                                          HPI: 4 yoM admitted 10/9 with new upper abdominal discomfort and bloating with concern for recurrent GOO due to chronic pyloric stricture. 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  Pharmacy consulted to start TPN for gastric ileus and obstruction 10/12.   Significant events:   Today:   Glucose - No hx DM noted. CBGs at goal.   Electrolytes - lytes WNL. CorrCa 9.3.  Renal - No issues noted  LFTs - improving   TGs -76 (10/13), 97 (10/16)  Prealbumin - 17.8 (10/11), 12.8 (10/13), 11 (10/16)  NUTRITIONAL GOALS                                                                                             RD recs: 2250-2450 Kcal/day, 110-120g protein/day, 2.2-2.4L fluid/day  Clinimix 5/20 at a goal rate of 96ml/hr + 20% fat emulsion at 24ml/hr to provide: 108g/day protein, 2380Kcal/day.  **There is currently a Acupuncturist of Clinimix solution.  To conserve supply, will consider goal rate of Clinimix 5/20 at 83 ml/min (provides 2233 Kcal/day and 100g protein/day) which will keep total volume at 2L rather than increasing use to 3L.   PLAN                                                                                                                         At 1800 today:  Continue  Clinimix E5/20 at 35ml/hr. Goal rate.  Cont 20% fat  emulsion at 10 ml/hr.  TPN to contain standard multivitamins and trace elements.  continue IVF to 8320ml/hr.  Cont sensitive SSI q6h.   TPN lab panels on Mondays & Thursdays.  F/u daily.  Arley PhenixEllen Tanaia Hawkey RPh 12/01/2015, 10:04 AM Pager [phone]

## 2015-12-01 NOTE — Progress Notes (Signed)
Nutrition Brief Follow-up  Patient currently in OR for Procedure(s): LAPAROSCOPIC PLACEMENT OF FEEDING GASTROSTOMY (N/A) ESOPHAGOGASTRODUODENOSCOPY (EGD) BALLOON DILATION OF DUODENAL STRICTURE (N/A)   Noted per Pharmacy, TPN to continue at goal: Clinimix E5/20 at 83 ml/hr and ILE at 10 ml/hr, providing 2233 kcal(99% of needs) and 100g protein (91% of needs).   Tube feeding recommendations are as follows: Osmolite 1.5 @ goal rate of 65 ml/hr.  30 ml Prostat once daily. Tube feeding regimen provides 2440 kcal (100% of needs), 112 grams of protein, and 1188 ml of H2O.   Please consult RD for tube feeding management when tube is ready to use.  Page/consult for any additional needs.  Tilda FrancoLindsey Joreen Swearingin, MS, RD, LDN Pager: [phone] After Hours Pager: [phone]

## 2015-12-01 NOTE — Progress Notes (Signed)
Paged Dr. Gerrit FriendsGerkin for clarification of orders with whether or not to give/hold medications per tube with an order stating do not give per tube unitl patient is tolerating full liquids. Received orders to hold reglan until morning, hold zantac to decide in am, nothing per tube or PO dt NPO status, and give protonix IV if that is not available to give pepcid IV. Will cont to monitor patient and report to am shift.

## 2015-12-01 NOTE — H&P (View-Only) (Signed)
[place]  [address] [name], [place] [address] Phone: [phone] FAX: [phone]   [name] [id number] December 07, 1940  CARE TEAM:  PCP: Irven Shelling, MD  Outpatient Care Team: Patient Care Team: Lavone Orn, MD as PCP - General (Internal Medicine) Michael Boston, MD as Consulting Physician (General Surgery) Arta Silence, MD as Consulting Physician (Gastroenterology)  Inpatient Treatment Team: Treatment Team: Attending Provider: Michael Boston, MD; Consulting Physician: Arta Silence, MD; Technician: Sueanne Margarita, NT; Technician: Abbe Amsterdam, NT; Registered Nurse: Mertha Baars, RN; Technician: Raylene Everts, NT; Registered Nurse: Arnold Long, RN; Technician: Leda Quail, NT  Problem List:   Principal Problem:   Gastric outlet obstruction Active Problems:   Essential hypertension, benign   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 09/25/2015   Anxiety state   Gastric Ileus    Protein-calorie malnutrition, moderate ([place])     Assessment/Plan:  GASTRIC ILEUS with presumed recurrent stricture most likely inflammatory in nature and at the gastroduodenal anastomosis.  NG tube  Continue IV PPI, H2 blocker Reglan, erythromycin to stimulate motility   If not opened up by 10/17:  EGD with Removal of any remaining solid material and probable balloon dilation.  Do in the operating room and do diagnostic laparoscopy and air leak test to make sure I had not perforated. Place a gastrostomy tube as well and probable feeding jejunostomy tube.  Hopefully could then transitioned to tube feeds and wean off IV nutrition.    If no improvement after serial balloon dilations over next 6-12 weeks, may also require gastrojejunostomy loop Bilroth II with possible gastric reresection.  I would like to hold off until we've proven that the Bilroth I gastroduodenostomy is nonsalvageable and his  nutrition is normal.  Discussed with patient and wife & daughter the risks. They understand and agree with this plan:  The anatomy & physiology of the foregut and digestive tract was discussed.  Natural history risks without surgery was discussed.   The patient's situation is not adequately controlled by nonoperative interventions by gastroenterology and interventional radiology.  I feel the risks of no intervention will lead to serious problems that outweigh the operative risks; therefore, I recommended surgery to place a feeding tube into the stomach. Dilated at the gastroduodenal anastomosis.Need for a thorough workup to rule out the differential diagnosis and plan treatment was explained.  I explained laparoscopic techniques with possible need for an open approach.  Risks such as bleeding, infection, abscess, leak, injury to other organs, need for repair of tissues / organs, need for further treatment, heart attack, death, and other risks were discussed.   I noted a good likelihood this will help address the problem.  Goals of post-operative recovery were discussed as well.  Possibility that this will not correct all symptoms was explained.  We will work to minimize complications.   Educational handouts further explaining the pathology, treatment options, and dysphagia diet was given as well.  Questions were answered.  The patient expresses understanding & wishes to proceed with surgery.    IV TNA nutrition in the meantime since he has a prolonged ileus.  Follow LFTs - slight inc but not severe - d/w pharmacy  Anxiolysis - valium working  HTN control  Try enema to empty constipated stool & stimulate bowel function   VTE prophylaxis - SCDs  Mobilize as tolerated to help recovery   [name], M.D., F.A.C.S. Gastrointestinal and Minimally Invasive Surgery [facility] [address], [place], [place] [address] [phone] Main /  Paging    11/30/2015   Subjective:  Denies abdominal pain or discomfort  Urinating without difficulty Tolerating NG tube but nares/throat sore States flatus Daughter in room  Objective:  Vital signs:  Vitals:   11/29/15 0522 11/29/15 1334 11/29/15 2142 11/30/15 0638  BP:  123/78 (!) 152/59 (!) 131/34  Pulse: 60 (!) 57 69 61  Resp:  _0 Temp:  98 F (36.7 C) 98.2 F (36.8 C) 98.7 F (37.1 C)  TempSrc:  Oral Oral Oral  SpO2:  98% 98% 99%  Weight:      Height:        Last BM Date: 11/25/15  Intake/Output   Yesterday:  10/15 0701 - 10/16 0700 In: 2403.6 [I.V.:2303.6; IV Piggyback:100] Out: 1625 [Urine:375; Emesis/NG output:1250] This shift:  No intake/output data recorded.  Bowel function:  Flatus: No  BM:  No  Drain: Thin bilious.  NGT flushes more easily & less cloudy today   Physical Exam:  General: Pt awake/alert/oriented x4 in No acute distress Eyes: PERRL, normal EOM.  Sclera clear.  No icterus Neuro: CN II-XII intact w/o focal sensory/motor deficits. Lymph: No head/neck/groin lymphadenopathy Psych:  No delerium/psychosis/paranoia HENT: Normocephalic, Mucus membranes moist.  No thrush Neck: Supple, No tracheal deviation Chest: CTAB. Chest wall pain w good excursion CV:  Pulses intact.  Regular rhythm MS: Normal AROM mjr joints.  No obvious deformity Abdomen: Soft.  Nondistended.  Nontender.  No evidence of peritonitis.  Diminished bowel sounds (difficult to appreciate over NGT suction). Ext:  SCDs BLE.  No mjr edema.  No cyanosis Skin: No petechiae / purpura  Results:   Labs: Results for orders placed or performed during the hospital encounter of 11/24/15 (from the past 48 hour(s))  Glucose, capillary     Status: Abnormal   Collection Time: 11/28/15  4:09 PM  Result Value Ref Range   Glucose-Capillary 119 (H) 65 - 99 mg/dL  Comprehensive metabolic panel     Status: Abnormal   Collection Time: 11/29/15  4:45 AM  Result Value Ref  Range   Sodium 135 135 - 145 mmol/L   Potassium 3.7 3.5 - 5.1 mmol/L   Chloride 106 101 - 111 mmol/L   CO2 26 22 - 32 mmol/L   Glucose, Bld 116 (H) 65 - 99 mg/dL   BUN 18 6 - 20 mg/dL   Creatinine, Ser 0.62 0.61 - 1.24 mg/dL   Calcium 8.2 (L) 8.9 - 10.3 mg/dL   Total Protein 6.0 (L) 6.5 - 8.1 g/dL   Albumin 2.9 (L) 3.5 - 5.0 g/dL   AST 210 (H) 15 - 41 U/L   ALT 384 (H) 17 - 63 U/L   Alkaline Phosphatase 142 (H) 38 - 126 U/L   Total Bilirubin 0.6 0.3 - 1.2 mg/dL   GFR calc non Af Amer >60 >60 mL/min   GFR calc Af Amer >60 >60 mL/min    Comment: (NOTE) The eGFR has been calculated using the CKD EPI equation. This calculation has not been validated in all clinical situations. eGFR's persistently <60 mL/min signify possible Chronic Kidney Disease.    Anion gap 3 (L) 5 - 15  Glucose, capillary     Status: Abnormal   Collection Time: 11/29/15  7:53 AM  Result Value Ref Range   Glucose-Capillary 126 (H) 65 - 99 mg/dL  Glucose, capillary     Status: Abnormal  Collection Time: 11/29/15  4:30 PM  Result Value Ref Range   Glucose-Capillary 125 (H) 65 - 99 mg/dL  Comprehensive metabolic panel     Status: Abnormal   Collection Time: 11/30/15  5:25 AM  Result Value Ref Range   Sodium 136 135 - 145 mmol/L   Potassium 4.1 3.5 - 5.1 mmol/L   Chloride 107 101 - 111 mmol/L   CO2 23 22 - 32 mmol/L   Glucose, Bld 108 (H) 65 - 99 mg/dL   BUN 21 (H) 6 - 20 mg/dL   Creatinine, Ser 0.61 0.61 - 1.24 mg/dL   Calcium 8.4 (L) 8.9 - 10.3 mg/dL   Total Protein 6.0 (L) 6.5 - 8.1 g/dL   Albumin 2.9 (L) 3.5 - 5.0 g/dL   AST 86 (H) 15 - 41 U/L   ALT 236 (H) 17 - 63 U/L   Alkaline Phosphatase 121 38 - 126 U/L   Total Bilirubin 0.6 0.3 - 1.2 mg/dL   GFR calc non Af Amer >60 >60 mL/min   GFR calc Af Amer >60 >60 mL/min    Comment: (NOTE) The eGFR has been calculated using the CKD EPI equation. This calculation has not been validated in all clinical situations. eGFR's persistently <60 mL/min signify  possible Chronic Kidney Disease.    Anion gap 6 5 - 15  Magnesium     Status: None   Collection Time: 11/30/15  5:25 AM  Result Value Ref Range   Magnesium 1.9 1.7 - 2.4 mg/dL  Phosphorus     Status: None   Collection Time: 11/30/15  5:25 AM  Result Value Ref Range   Phosphorus 3.7 2.5 - 4.6 mg/dL  CBC     Status: Abnormal   Collection Time: 11/30/15  5:25 AM  Result Value Ref Range   WBC 6.7 4.0 - 10.5 K/uL   RBC 3.72 (L) 4.22 - 5.81 MIL/uL   Hemoglobin 11.2 (L) 13.0 - 17.0 g/dL   HCT 32.8 (L) 39.0 - 52.0 %   MCV 88.2 78.0 - 100.0 fL   MCH 30.1 26.0 - 34.0 pg   MCHC 34.1 30.0 - 36.0 g/dL   RDW 12.7 11.5 - 15.5 %   Platelets 253 150 - 400 K/uL  Differential     Status: Abnormal   Collection Time: 11/30/15  5:25 AM  Result Value Ref Range   Neutrophils Relative % 47 %   Neutro Abs 3.1 1.7 - 7.7 K/uL   Lymphocytes Relative 30 %   Lymphs Abs 2.0 0.7 - 4.0 K/uL   Monocytes Relative 18 %   Monocytes Absolute 1.2 (H) 0.1 - 1.0 K/uL   Eosinophils Relative 4 %   Eosinophils Absolute 0.3 0.0 - 0.7 K/uL   Basophils Relative 1 %   Basophils Absolute 0.1 0.0 - 0.1 K/uL    Imaging / Studies: Dg Abd Acute W/chest  Result Date: 11/29/2015 CLINICAL DATA:  Abdominal distension. EXAM: DG ABDOMEN ACUTE W/ 1V CHEST COMPARISON:  CT scan November 25, 2015 FINDINGS: A right PICC line terminates in the central SVC. No pneumothorax. The heart, hila, mediastinum, lungs, and pleura are normal. No free air, portal venous gas, or pneumatosis. The NG tube terminates in the gastric antrum. The stomach is distended with air and fluid. Previous cholecystectomy. No small bowel or colonic dilatation to suggest obstruction. No other acute abnormalities. IMPRESSION: 1. The stomach remains distended with air and fluid despite the NG tube. Electronically Signed   By: Dorise Bullion III M.D  On: 11/29/2015 10:24    Medications / Allergies: per chart  Antibiotics: Anti-infectives    Start     Dose/Rate  Route Frequency Ordered Stop   11/27/15 1200  erythromycin 500 mg in sodium chloride 0.9 % 100 mL IVPB     500 mg 100 mL/hr over 60 Minutes Intravenous Every 6 hours 11/27/15 0940 11/29/15 0608   11/26/15 0830  erythromycin 250 mg in sodium chloride 0.9 % 100 mL IVPB  Status:  Discontinued     250 mg 100 mL/hr over 60 Minutes Intravenous Every 6 hours 11/26/15 0751 11/27/15 0940        Note: Portions of this report may have been transcribed using voice recognition software. Every effort was made to ensure accuracy; however, inadvertent computerized transcription errors may be present.   Any transcriptional errors that result from this process are unintentional.    [name], M.D., F.A.C.S. Gastrointestinal and Minimally Invasive Surgery [facility] [address], [place], [place] [address] [phone] Main / Paging     11/30/2015

## 2015-12-01 NOTE — Op Note (Signed)
12/01/2015  5:35 PM  PATIENT:  [name]  75 y.o. male  Patient Care Team: [name], MD as PCP - General (Internal Medicine) Karie Soda, MD as Consulting Physician (General Surgery) Willis Modena, MD as Consulting Physician (Gastroenterology)  PRE-OPERATIVE DIAGNOSIS:  RECURRENT GASTRIC OUTLET OBSTRUCTION  POST-OPERATIVE DIAGNOSIS:    RECURRENT GASTRIC OUTLET OBSTRUCTION DUE to significant edema at gastroduodenal anastomosis. GASTRITIS Failure to thrive with malnutrition  PROCEDURE:   LAPAROSCOPIC PLACEMENT OF FEEDING JEJUNOSTOMY AND GASTROSTOMY TUBEs ESOPHAGOGASTRODUODENOSCOPY (EGD) BALLOON DILATION OF DUODENAL STRICTURE Gastric biopsies  Closure of gastrotomy  SURGEON:  Surgeon(s): Karie Soda, MD  ASSISTANT: RN   ANESTHESIA:   local and general  EBL:  Total I/O In: 2545 [I.V.:2445; IV Piggyback:100] Out: 700 [Urine:300; Emesis/NG output:300; Blood:100]  Delay start of Pharmacological VTE agent (>24hrs) due to surgical blood loss or risk of bleeding:  no  DRAINS:   1.  24Fr MicKey G-Tube rests in the left upper quadrant. 2.  Jejunostomy tube 16 Fr MicKey rests in the left lower quadrant/paramedian region into the proximal jejunum  SPECIMEN:  Distal gastric biopsies to rule out Helicobacter pylori  DISPOSITION OF SPECIMEN:  N/A  COUNTS:  YES  PLAN OF CARE: Admit to inpatient   PATIENT DISPOSITION:  PACU - hemodynamically stable.  IINDICATION:   Pleasant patient with history of worsening pyloric stricture.  Underwent vagotomy and antrectomy in August.  Initially.  One home but then had recurrent gastric outlet obstruction refractory to nasogastric tube decompression and aggressive antacid medication.  Retained food.  I felt he would benefit from EGD with balloon dilation of anastomosis and gastrostomy to place an feeding jejunostomy tube placement.     Recommendation for feeding tube for ability to give medicines and nutrition through care. The  anatomy and physiology of the digestive tract was explained. The need of nutrition to help in patient recovery and survival was discussed Technique of placement of feeding tube through endoscopic, laparoscopic and open techniques were discussed. Technique risks benefits alternatives discussed.  Risk of perforation discussed.  Risk of further dilations are reoperation or convert into Billroth II gastrojejunostomy in the future was discussed as well.   Risks such as bleeding, infection, stroke, heart attack, and death were discussed. Risks of injury to other organs such as intestines were discussed. Long-term issues of catheter occlusion, leak, skin irritation, falling out, need for replacement, and others were discussed. I noted a good likelihood this will help address the problem. Questions answered and the patient, his wife, and his daughter agree to proceed.    OR FINDINGS:  Thickened gastric wall but not massively dilated.  No evidence of cancer or carcinomatosis in the peritoneal cavity.  Moderate burden of some retained food.  Tight gastroduodenal anastomosis with inflammation and edema but able to be balloon dilated.  Resulting lumen about 18mm in size.  No evidence of any stitching.  No perforation.  No cancer or tumor.  Moderate gastritis.  Biopsies taken to r/o recurrent Helicobacter pylori.  24 French MIC key gastrostomy tube placement in left upper quadrant.  16 French MIC key feeding jejunostomy tube in left paramedian/lower quadrant region.   DESCRIPTION:  Informed consent was confirmed. The patient underwent general anaesthesia without difficulty. The patient was positioned appropriately. VTE prevention in place. The patient's abdomen was clipped, prepped, & draped in a sterile fashion. Surgical timeout confirmed our plan.   The patient was positioned in reverse Trendelenburg. Abdominal entry with a 5mm laparoscopic port was gained using optical  entry technique in the left upper  abdomen. Entry was clean. I induced carbon dioxide insufflation. Camera inspection revealed no injury. Extra ports were carefully placed under direct laparoscopic visualization.   Diagnostic laparoscopy revealed a thickened but decompressed stomach.  Not massively enlarged anymore.  Consider gastroduodenal anastomosis with an omentopexy and place.  Some inflammation but no evidence of any abscess or perforation.  Proceed with esophagogastroduodenoscopy.  Was able to pass it through the hypopharynx into the esophagus.  Passed down to the esophagogastric junction around 53 cm from the incisors.  No esophagitis or obstruction or web or stricture.  Antegrade any inflamed stomach with some gastritis.  Moderate volume of retained food.  Did some irrigation.  This able to follow down distally to the gastroduodenal anastomosis along the lesser curvature of the stomach.  I can see a little bit of stitch and it looked thickened and tight but there was a pinhole lumen.  I was not able to intubated with the endoscope.  I was able to balloon dilated though.  Start with a 10/12 balloon dilator.  Placed across the anastomosis.  Dilated to 10 mm 60 seconds.  Then dilated to 12 mm 60 seconds.  Switched out to a 12/15 mm dilator.  Then it began to 12 mm 60 seconds.  Then dilated to 15 mm 90 seconds.  There is still some inflammation but a better lumen could be noted.  I did biopsies of the stomach near the gastroduodenal anastomosis 3.  Sent that to rule out recurrent Helicobacter pylori.  I distended the stomach with the EGD with the jejunal clamped off.  It dilated well.  There was no air leak at the gastroduodenal anastomosis, arguing against any perforation due to the balloon dilation.  Because he had significant retained food, I decided to remove it trans-gastrically through some laparoscopic ports.  I used 2-0 PDS sutures along the greater curvature of the stomach in a diamond pattern.  I brought the stitches up  through the abdominal wall using a Endo Close laparoscopic suture passer.  With that I could direct the 10 mm port into this elevated part of the stomach through into the gastric lumen and re-below the balloon up.  Camera inspection revealed entry into the stomach itself.   I placed a 5 mm ballon port a little more and inferiorly through the preselected jejunostomy site and placed it into the stomach as well..  Inspection stomach noted gastritis and retained food is seen by EGD..  I was able to aspirate the remaining chunks of food with a large laparoscopic aspirator as well as grasper.  Irrigated copiously.     eye remove the 10 mm port and placed a 24 French MIC key gastrostomy tube through that port site and through the gastrotomy into the stomach.  Blew the balloon up.  Tied the PDS sutures down.  I did one more stitch and pulled out up.  Therefore stomach tacked in five places around the tube in a pentagonal pattern for a airtight seal.  I remove the 5mm balloon port out of the stomach but Then the peritoneum..  I closed that small gastrostomy site with 2-0 PDS interrupted sutures.  Also tacked some greater omentum over that repair given the gastritis for an omentopexy using interrupted 2-0 silk suture to good result.  I proceeded with placing a feeding jejunostomy.  Identified the ligament of Treitz and ran at 30 cm more distally.  I again placed PDS sutures in a diamond pattern  on the antimesenteric side of the jejunum to help bring a flat surface up towards the peritoneum.[name].  Used 3-0  PDS suture.  Brought up to the abdominal wall near the 5 mm balloon port site.  I redirected that 5 mm balloon port into the jejunostomy lumen.  Switched out and passed a 16 [place] MIC key pediatric feeding tube through the port site defect and through the jejunostomy into the jejunum.   I made sure the jejunostomy was advanced distally such that the tip was 15 cm distal to its entry site.  Tied the PDS sutures down to  have an seal.  Again placed a fifth stitch to have a pentagonal pattern of stitches provide in a airtight seal of jejunum to the peritoneum circumferentially around the tube.   The jejunal lumen was rather narrow even with mildly filling of the balloon so I left the balloon deflated.   I did laparoscopic inspection of the abdomen.  Hemostasis was good.  Evacuated carbon dioxide and removed the ports.  The remaining 5mm port sites were closed with Monocryl suture.  The gastrostomy and jejunostomy tube flanges were secured to the skin using interrupted 2-0 Prolene suture.  I did wrap 1 Prolene suture around the jejunostomy tube through its port site and around the tube and down as a less of to help discourage the jejunostomy tube from getting pulled out.  The per patient as extubated recovery room.  Mildly confused but consolable.  Had a long discussion with the patient's wife, daughter, and brother-in-law.  Explain plans to transition from parenteral to enteral nutrition and medication.  I did note the patient may need repeat balloon dilations.  May require a revised or new anastomosis or gastric second resection but I am hopeful setting side to get some dilation at this will help.  Follow-up on the biopsies.  They expressed understanding and appreciation.  no significant abnormalities. I could find the stomach. I could reach the greater curvature to come up into the left upper abdomen. I proceeded to place 2-0 PDS interrupted sutures on the stomach along the distal anterior body of the stomach where it easily reached up. I did this x5 in an inverted .  I brought the tails of the stitches up through the left upper quadrant anterior abdominal wall.  This help pull a nice flat region of anterior stomach wall to the perineal cavity.  Ardeth SportsmanSteven C. Naresh Althaus, M.D., F.A.C.S. Gastrointestinal and Minimally Invasive Surgery [facility] [address] [place], [place] [id number] [phone]  [phone] Main / Paging

## 2015-12-01 NOTE — Interval H&P Note (Signed)
History and Physical Interval Note:  12/01/2015 12:19 PM  [name]  has presented today for surgery, with the diagnosis of GASTRIC OUTLET OBSTRUCTION  The various methods of treatment have been discussed with the patient and family. After consideration of risks, benefits and other options for treatment, the patient has consented to  Procedure(s): LAPAROSCOPIC PLACEMENT OF FEEDING GASTROSTOMY (N/A) ESOPHAGOGASTRODUODENOSCOPY (EGD) BALLOON DILATION OF DUODENAL STRICTURE (N/A) as a surgical intervention .  The patient's history has been reviewed, patient examined, no change in status, stable for surgery.  I have reviewed the patient's chart and labs.  Questions were answered to the patient's satisfaction.     Sonia Stickels C.

## 2015-12-01 NOTE — Care Management Important Message (Signed)
Important Message  Patient Details  Name: Danny GalRalph T Litzenberger MRN: [id number] Date of Birth: 01/25/1941   Medicare Important Message Given:  Yes    Haskell FlirtJamison, Milta Croson 12/01/2015, 12:34 PMImportant Message  Patient Details  Name: Danny GalRalph T Cowman MRN: [id number] Date of Birth: 01/25/1941   Medicare Important Message Given:  Yes    Haskell FlirtJamison, Charnice Zwilling 12/01/2015, 12:34 PM

## 2015-12-02 ENCOUNTER — Inpatient Hospital Stay (HOSPITAL_COMMUNITY): Payer: Medicare Other

## 2015-12-02 ENCOUNTER — Encounter (HOSPITAL_COMMUNITY): Payer: Self-pay | Admitting: Surgery

## 2015-12-02 LAB — GLUCOSE, CAPILLARY
GLUCOSE-CAPILLARY: 113 mg/dL — AB (ref 65–99)
GLUCOSE-CAPILLARY: 144 mg/dL — AB (ref 65–99)
GLUCOSE-CAPILLARY: 163 mg/dL — AB (ref 65–99)
GLUCOSE-CAPILLARY: 176 mg/dL — AB (ref 65–99)
GLUCOSE-CAPILLARY: 246 mg/dL — AB (ref 65–99)
Glucose-Capillary: 189 mg/dL — ABNORMAL HIGH (ref 65–99)
Glucose-Capillary: 193 mg/dL — ABNORMAL HIGH (ref 65–99)

## 2015-12-02 LAB — PREALBUMIN: Prealbumin: 12.5 mg/dL — ABNORMAL LOW (ref 18–38)

## 2015-12-02 MED ORDER — OSMOLITE 1.5 CAL PO LIQD
1000.0000 mL | ORAL | Status: DC
  Administered 2015-12-02: 1000 mL
  Filled 2015-12-02 (×4): qty 1000

## 2015-12-02 MED ORDER — FAT EMULSION 20 % IV EMUL
240.0000 mL | INTRAVENOUS | Status: DC
  Administered 2015-12-02: 240 mL via INTRAVENOUS
  Filled 2015-12-02: qty 250

## 2015-12-02 MED ORDER — JEVITY 1.2 CAL PO LIQD
1000.0000 mL | ORAL | Status: DC
  Filled 2015-12-02: qty 1000

## 2015-12-02 MED ORDER — PRO-STAT SUGAR FREE PO LIQD
30.0000 mL | Freq: Every day | ORAL | Status: DC
  Administered 2015-12-02 – 2015-12-03 (×2): 30 mL
  Filled 2015-12-02 (×2): qty 30

## 2015-12-02 MED ORDER — DIAZEPAM 5 MG/ML PO CONC: 5.0000 mg | Freq: Four times a day (QID) | ORAL | Status: DC | PRN

## 2015-12-02 MED ORDER — DIATRIZOATE MEGLUMINE & SODIUM 66-10 % PO SOLN
30.0000 mL | Freq: Once | ORAL | Status: AC
  Administered 2015-12-02: 30 mL via ORAL
  Filled 2015-12-02: qty 30

## 2015-12-02 MED ORDER — TRACE MINERALS CR-CU-MN-SE-ZN 10-1000-500-60 MCG/ML IV SOLN
INTRAVENOUS | Status: DC
  Administered 2015-12-02: 17:00:00 via INTRAVENOUS
  Filled 2015-12-02: qty 1992

## 2015-12-02 MED ORDER — OXYCODONE HCL 20 MG/ML PO CONC
5.0000 mg | ORAL | Status: DC | PRN
  Administered 2015-12-02: 5 mg via ORAL
  Administered 2015-12-03: 10 mg via ORAL
  Filled 2015-12-02 (×2): qty 1

## 2015-12-02 MED ORDER — DIAZEPAM 1 MG/ML PO SOLN: 5.0000 mg | Freq: Four times a day (QID) | ORAL | Status: DC | PRN

## 2015-12-02 MED ORDER — ACETAMINOPHEN 160 MG/5ML PO SOLN
325.0000 mg | ORAL | Status: DC | PRN
  Administered 2015-12-02 (×2): 650 mg
  Filled 2015-12-02 (×2): qty 20.3

## 2015-12-02 NOTE — Progress Notes (Signed)
Nutrition Follow-up  DOCUMENTATION CODES:   Severe malnutrition in context of acute illness/injury  INTERVENTION:   TPN per Pharmacy  Initiate Osmolite 1.5 @ 20 ml/hr via J-tube and increase by 10 ml every 8 hours to goal rate of 65 ml/hr.  30 ml Prostat daily.   Tube feeding regimen provides 2440kcal (100% of needs), 112grams of protein, and 1188ml of H2O.  RD to follow-up 10/19 to monitor tolerance and ability to wean TPN.  NUTRITION DIAGNOSIS:   Inadequate oral intake related to inability to eat as evidenced by NPO status.  Ongoing.  GOAL:   Patient will meet greater than or equal to 90% of their needs  Meeting with TPN.  MONITOR:   Labs, Weight trends, TF tolerance, Skin, I & O's, Other (Comment) (TPN)  REASON FOR ASSESSMENT:   Consult Enteral/tube feeding initiation and management  ASSESSMENT:   75 year old male presenting for a post-operative visit. Note for "Post-Operative": Patient returns one month status post robotic resection.  Distal gastrectomy with Billroth I gastroduodenal reconstruction for chronic ulcer causing obstruction.  09/25/2015 10/17: s/p LAPAROSCOPIC PLACEMENT OF FEEDING JEJUNOSTOMY AND GASTROSTOMY TUBEs ESOPHAGOGASTRODUODENOSCOPY (EGD) BALLOON DILATION OF DUODENAL STRICTURE  Pt in room with family and nursing students at bedside. Pt denies any nausea or discomfort at this time. Continues to receive TPN at goal rate: Clinimix E5/20 @ 83 ml/hr and ILE at 10 ml/hr, providing 2233 kcal(99% of needs) and 100g protein (91% of needs).   Will begin tube feeds today via J-tube. Pt with G-tube as well but will be fed through J-tube. Will slowly advance Osmolite 1.5 towards goal rate of 65 ml/hr. Patient's questions answered. Will follow-up 10/19 to assess tolerance and ability to wean off TPN.  Medications: Reglan solution QID, D5 and .45% NaCl w/ KCl infusion at 20 ml/hr -provides 81 kcal Labs reviewed: CBGs: 113-163  Plan per Pharmacy  10/18: At 1800 today:  Continue Clinimix E5/20 at 983ml/hr at goal rate for today  Cont 20% fat emulsion at 10 ml/hr for today  Tube feeds to start today and rate to be increased as tolerated to goal. Hopeful to wean TPN tomorrow and discontinue  Diet Order:  TPN (CLINIMIX-E) Adult Diet clear liquid Room service appropriate? Yes; Fluid consistency: Thin; Fluid restriction: 1200 mL Fluid TPN (CLINIMIX-E) Adult  Skin:  Wound (see comment) (10/17 abdominal incision)  Last BM:  10/11  Height:   Ht Readings from Last 1 Encounters:  11/24/15 6' (1.829 m)    Weight:   Wt Readings from Last 1 Encounters:  11/26/15 180 lb 14.4 oz (82.1 kg)    Ideal Body Weight:  80.9 kg  BMI:  Body mass index is 24.53 kg/m.  Estimated Nutritional Needs:   Kcal:  2250-2450  Protein:  110-120g  Fluid:  2.2-2.4L/day  EDUCATION NEEDS:   No education needs identified at this time  Tilda FrancoLindsey Inocencia Murtaugh, MS, RD, LDN Pager: [phone] After Hours Pager: [phone]

## 2015-12-02 NOTE — Progress Notes (Signed)
Nurse Revonda Standardech Shenelle  Called RN into [name] room  and said patient's Tube had come out.  On assessment, nurse found that the G-Tube was pulled completely except for the flat disk sutured to the ostomy.  Patient looked undistressed at this time.  Notified Dr. Carolynne Edouardoth on call about the G-Tube, Orderes were to place 55F foley cath in opening 4-5 inches, Blow up the baloon Then send patient to Radiology to place gastrograpin in tube to check for bleeding and appearance. G-Tube was placed back with assist of Nurse Asatu.  Unable to remove sutures around disk as patient had pain with attempted removal because if was tight.

## 2015-12-02 NOTE — Progress Notes (Signed)
[facility]  [address]  [place], [place] [address] Phone: [phone] FAX: [phone]   Cha T Pelzer [id number] 11/09/1940  CARE TEAM:  PCP: GRIFFIN,JOHN JOSEPH, MD  Outpatient Care Team: Patient Care Team: [name], MD as PCP - General (Internal Medicine)  , MD as Consulting Physician (General Surgery) William Outlaw, MD as Consulting Physician (Gastroenterology)  Inpatient Treatment Team: Treatment Team: Attending Provider:  , MD; Consulting Physician: William Outlaw, MD; Technician: Macy P Simmons, NT; Technician: Annette A Mink, NT; Registered Nurse: Cristy R McClean, RN; Technician: Belinda Bryant-Briggs, NT      1 Day Post-Op 12/01/2015  POST-OPERATIVE DIAGNOSIS:    RECURRENT GASTRIC OUTLET OBSTRUCTION DUE to significant edema at gastroduodenal anastomosis. GASTRITIS Failure to thrive with malnutrition  PROCEDURE:   LAPAROSCOPIC PLACEMENT OF FEEDING JEJUNOSTOMY AND GASTROSTOMY TUBEs ESOPHAGOGASTRODUODENOSCOPY (EGD) BALLOON DILATION OF DUODENAL STRICTURE Gastric biopsies  Closure of gastrotomy  SURGEON:   , MD  Prior surgery 09/25/2015  POST-OPERATIVE DIAGNOSIS:  Partial gastric outlet obstruction due to chronic pyloric stricture  PROCEDURE:   XI ROBOTIC distal gastrectomy BILROTH I  ANASTOMOSIS  ANTERIOR AND POSTERIOR VAGOTOMY DOR (Anterior 180 degree) FUNDIPLICATION OMENTOPEXY UPPER ENDOSCOPY  SURGEON:   , MD   Problem List:   Principal Problem:   Gastric outlet obstruction Active Problems:   Essential hypertension, benign   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 09/25/2015   Hyperlipidemia   Anxiety state   Gastric Ileus    Protein-calorie malnutrition, moderate (HCC)    Assessment/Plan:  GASTRIC ILEUS with recurrent stricture most likely inflammatory in nature and at the gastroduodenal anastomosis.  -start  TFs via J tube (LLQ 16Fr MicKey)  -try mild PO clears.  Gtube to gravity for now - if tol PO & no n/v/bloating, try clamping trials  -convert to enteral liquid meds via J tube  -PPI, H2B  -Reglan, erythromicin  IV TNA nutrition in the meantime since he has a prolonged ileus.  Hopefully wean off if tol TFs.  Follow LFTs - slight inc but not severe - d/w pharmacy  Anxiolysis - valium working  HTN control   VTE prophylaxis - SCDs  Mobilize as tolerated to help recovery    C. , M.D., F.A.C.S. Gastrointestinal and Minimally Invasive [facility] [address] [place], [place] [address] [phone] Main / Paging    12/02/2015   Subjective:  Confused postop but better this AM Mild abdominal pain at G & J tubes Daughter in room  Objective:  Vital signs:  Vitals:   12/01/15 2150 12/01/15 2250 12/02/15 0155 12/02/15 0540  BP: 132/64 (!) 123/53 128/60 (!) 147/70  Pulse: 63 80 79 76  Resp: 15 14 15 16  Temp:  98.1 F (36.7 C) 98.3 F (36.8 C) 97.6 F (36.4 C)  TempSrc: Oral Oral Oral Oral  SpO2: 99% 99% 99% 99%  Weight:      Height:        Last BM Date: 11/25/15  Intake/Output   Yesterday:  10/17 0701 - 10/18 0700 In: 4808 [I.V.:4608; IV Piggyback:200] Out: 1675 [Urine:1050; Emesis/NG output:300; Drains:225; Blood:100] This shift:  No intake/output data recorded.  Bowel function:  Flatus: No  BM:  No  Drain: Thin bilious.  NGT flushes more easily & less cloudy today   Physical Exam:  General: Pt awake/alert/oriented x4 in No acute distress Eyes: PERRL, normal EOM.  Sclera clear.  No icterus Neuro: CN II-XII intact w/o   focal sensory/motor deficits. Lymph: No head/neck/groin lymphadenopathy Psych:  No delerium/psychosis/paranoia HENT: Normocephalic, Mucus membranes moist.  No thrush.  Mildly slurred speech Neck: Supple, No tracheal deviation Chest: CTAB. Chest wall pain w good excursion CV:   Pulses intact.  Regular rhythm MS: Normal AROM mjr joints.  No obvious deformity Abdomen:  G tube LUQ to gravity old blood/bile.  J tube LLQ c/d/i.   Soft.  Nondistended.  Nontender.  No evidence of peritonitis.   GU:  Foley in place.  NEMG Ext:  SCDs BLE.  No mjr edema.  No cyanosis Skin: No petechiae / purpura  Results:   Labs: Results for orders placed or performed during the hospital encounter of 11/24/15 (from the past 48 hour(s))  Glucose, capillary     Status: Abnormal   Collection Time: 11/30/15 11:51 AM  Result Value Ref Range   Glucose-Capillary 121 (H) 65 - 99 mg/dL  Glucose, capillary     Status: Abnormal   Collection Time: 11/30/15  5:58 PM  Result Value Ref Range   Glucose-Capillary 125 (H) 65 - 99 mg/dL  Creatinine, serum     Status: None   Collection Time: 12/01/15  5:09 AM  Result Value Ref Range   Creatinine, Ser 0.70 0.61 - 1.24 mg/dL   GFR calc non Af Amer >60 >60 mL/min   GFR calc Af Amer >60 >60 mL/min    Comment: (NOTE) The eGFR has been calculated using the CKD EPI equation. This calculation has not been validated in all clinical situations. eGFR's persistently <60 mL/min signify possible Chronic Kidney Disease.   Glucose, capillary     Status: Abnormal   Collection Time: 12/02/15 12:10 AM  Result Value Ref Range   Glucose-Capillary 163 (H) 65 - 99 mg/dL  Glucose, capillary     Status: Abnormal   Collection Time: 12/02/15  5:54 AM  Result Value Ref Range   Glucose-Capillary 144 (H) 65 - 99 mg/dL    Imaging / Studies: No results found.  Medications / Allergies: per chart  Antibiotics: Anti-infectives    Start     Dose/Rate Route Frequency Ordered Stop   12/02/15 0000  erythromycin ethylsuccinate (EES) 200 MG/5ML suspension 400 mg    Comments:  Administer via J tube (16Fr LLQ)   400 mg Oral Every 6 hours 12/01/15 2020     11/27/15 1200  erythromycin 500 mg in sodium chloride 0.9 % 100 mL IVPB     500 mg 100 mL/hr over 60 Minutes Intravenous  Every 6 hours 11/27/15 0940 11/29/15 0608   11/26/15 0830  erythromycin 250 mg in sodium chloride 0.9 % 100 mL IVPB  Status:  Discontinued     250 mg 100 mL/hr over 60 Minutes Intravenous Every 6 hours 11/26/15 0751 11/27/15 0940        Note: Portions of this report may have been transcribed using voice recognition software. Every effort was made to ensure accuracy; however, inadvertent computerized transcription errors may be present.   Any transcriptional errors that result from this process are unintentional.     C. , M.D., F.A.C.S. Gastrointestinal and Minimally Invasive Surgery [facility] [address] [place], [place] [address] [phone] Main / Paging     12/02/2015 

## 2015-12-02 NOTE — Progress Notes (Signed)
Patient ID: [name], male   DOB: Feb 21, 1940, 75 y.o.   MRN: [id number] Called about patient pulling his gastrostomy out. Replaced with foley catheter and contrast study obtained. Discussed with radiology who feels catheter is in stomach. Will place to bag drainage.

## 2015-12-02 NOTE — Progress Notes (Signed)
After administering medications at 1737, patient became very cool and clammy, severe amount of pain. Immediately took BP, pulse, O2 Sat and CBG as noted in the flowsheet. Nurse notified and patient assessed. Patients current pain after medication admin at this time is 8/10.

## 2015-12-02 NOTE — Progress Notes (Signed)
PHARMACY - ADULT TOTAL PARENTERAL NUTRITION CONSULT NOTE   Pharmacy Consult for TPN Indication: Gastric ileus / obstruction  Patient Measurements: Height: 6' (182.9 cm) Weight: 180 lb 14.4 oz (82.1 kg) IBW/kg (Calculated) : 77.6 TPN AdjBW (KG): 81.4 Body mass index is 24.53 kg/m. Usual Weight:   Insulin Requirements: 4 units yesterday  Current Nutrition: NPO  IVF: D5W + 1/2NS with KCl 32mEq/L at 20 ml/hr  Central access: PICC placed 10/12 TPN start date: 10/12  ASSESSMENT                                                                                                          HPI: 32 yoM admitted 10/9 with new upper abdominal discomfort and bloating with concern for recurrent GOO due to chronic pyloric stricture. 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  Pharmacy consulted to start TPN for gastric ileus and obstruction 10/12.   Significant events:  10/18:  Lap placement of feeding jejunostomy and gastrostomy tubes done 10/17, to start TFs today and increase rate as tolerated per orders and hopeful to wean TPN off soon   Today: no labs 10/18, below from 10/17  Glucose - No hx DM noted. CBGs at goal. Note that D10W was hung for a while as TPN ran out while patient was getting feeding tube placed but highest CBG still only 163  Electrolytes - lytes WNL. CorrCa 9.3.  Renal - No issues noted  LFTs - improving   TGs -76 (10/13), 97 (10/16)  Prealbumin - 17.8 (10/11), 12.8 (10/13), 11 (10/16)  NUTRITIONAL GOALS                                                                                             RD recs: 2250-2450 Kcal/day, 110-120g protein/day, 2.2-2.4L fluid/day  Clinimix 5/20 at a goal rate of 43ml/hr + 20% fat emulsion at 17ml/hr to provide: 108g/day protein, 2380Kcal/day.  **There is currently a Acupuncturist of Clinimix solution.  To conserve supply, will consider goal rate of Clinimix 5/20 at 83  ml/min (provides 2233 Kcal/day and 100g protein/day) which will keep total volume at 2L rather than increasing use to 3L.   PLAN  At 1800 today:  Continue Clinimix E5/20 at 1383ml/hr at goal rate for today  Cont 20% fat emulsion at 10 ml/hr for today  Tube feeds to start today and rate to be increased as tolerated to goal. Hopeful to wean TPN tomorrow and discontinue  TPN to contain standard multivitamins and trace elements.  Continue IVF to 620ml/hr.  Cont sensitive SSI q6h.   TPN lab panels on Mondays & Thursdays.  F/u daily.   Hessie KnowsJustin M Abriel Hattery, PharmD, BCPS Pager [phone] 12/02/2015 10:07 AM

## 2015-12-02 NOTE — Progress Notes (Signed)
TNA and lipids hang this am @0745  ;new bag will be hung 1800 .

## 2015-12-03 ENCOUNTER — Inpatient Hospital Stay (HOSPITAL_COMMUNITY): Payer: Medicare Other | Admitting: Anesthesiology

## 2015-12-03 ENCOUNTER — Encounter (HOSPITAL_COMMUNITY)
Admission: AD | Disposition: A | Discharge status: Skilled Nursing Facility | Payer: Self-pay | Source: Ambulatory Visit | Attending: Surgery

## 2015-12-03 ENCOUNTER — Inpatient Hospital Stay (HOSPITAL_COMMUNITY): Payer: Medicare Other

## 2015-12-03 DIAGNOSIS — K653 Choleperitonitis: Secondary | ICD-10-CM

## 2015-12-03 DIAGNOSIS — K311 Adult hypertrophic pyloric stenosis: Principal | ICD-10-CM

## 2015-12-03 DIAGNOSIS — Z978 Presence of other specified devices: Secondary | ICD-10-CM

## 2015-12-03 DIAGNOSIS — G9340 Encephalopathy, unspecified: Secondary | ICD-10-CM

## 2015-12-03 DIAGNOSIS — Z9911 Dependence on respirator [ventilator] status: Secondary | ICD-10-CM

## 2015-12-03 HISTORY — DX: Choleperitonitis: K65.3

## 2015-12-03 HISTORY — PX: LAPAROSCOPY: SHX197

## 2015-12-03 LAB — COMPREHENSIVE METABOLIC PANEL
ALK PHOS: 78 U/L (ref 38–126)
ALT: 62 U/L (ref 17–63)
ANION GAP: 10 (ref 5–15)
AST: 27 U/L (ref 15–41)
Albumin: 2.6 g/dL — ABNORMAL LOW (ref 3.5–5.0)
BUN: 38 mg/dL — ABNORMAL HIGH (ref 6–20)
CALCIUM: 8.7 mg/dL — AB (ref 8.9–10.3)
CO2: 20 mmol/L — AB (ref 22–32)
Chloride: 104 mmol/L (ref 101–111)
Creatinine, Ser: 1.22 mg/dL (ref 0.61–1.24)
GFR calc non Af Amer: 56 mL/min — ABNORMAL LOW (ref >60)
Glucose, Bld: 150 mg/dL — ABNORMAL HIGH (ref 65–99)
Potassium: 4.3 mmol/L (ref 3.5–5.1)
SODIUM: 134 mmol/L — AB (ref 135–145)
TOTAL PROTEIN: 5.7 g/dL — AB (ref 6.5–8.1)
Total Bilirubin: 0.6 mg/dL (ref 0.3–1.2)

## 2015-12-03 LAB — BLOOD GAS, ARTERIAL
ACID-BASE DEFICIT: 5.3 mmol/L — AB (ref 0.0–2.0)
BICARBONATE: 22.1 mmol/L (ref 20.0–28.0)
Drawn by: 235321
FIO2: 100
LHR: 14 {breaths}/min
O2 SAT: 99.4 %
PATIENT TEMPERATURE: 37
PCO2 ART: 52.9 mmHg — AB (ref 32.0–48.0)
PEEP: 5 cmH2O
PH ART: 7.243 — AB (ref 7.350–7.450)
PO2 ART: 286 mmHg — AB (ref 83.0–108.0)
VT: 620 mL

## 2015-12-03 LAB — MAGNESIUM: Magnesium: 1.8 mg/dL (ref 1.7–2.4)

## 2015-12-03 LAB — GLUCOSE, CAPILLARY
GLUCOSE-CAPILLARY: 151 mg/dL — AB (ref 65–99)
GLUCOSE-CAPILLARY: 157 mg/dL — AB (ref 65–99)
Glucose-Capillary: 153 mg/dL — ABNORMAL HIGH (ref 65–99)

## 2015-12-03 LAB — CBC
HEMATOCRIT: 50.6 % (ref 39.0–52.0)
Hemoglobin: 17 g/dL (ref 13.0–17.0)
MCH: 30.9 pg (ref 26.0–34.0)
MCHC: 33.6 g/dL (ref 30.0–36.0)
MCV: 91.8 fL (ref 78.0–100.0)
PLATELETS: 349 10*3/uL (ref 150–400)
RBC: 5.51 MIL/uL (ref 4.22–5.81)
RDW: 13 % (ref 11.5–15.5)
WBC: 12.5 10*3/uL — AB (ref 4.0–10.5)

## 2015-12-03 LAB — PHOSPHORUS: PHOSPHORUS: 2.6 mg/dL (ref 2.5–4.6)

## 2015-12-03 SURGERY — LAPAROSCOPY, DIAGNOSTIC
Anesthesia: General

## 2015-12-03 MED ORDER — MIDAZOLAM HCL 2 MG/2ML IJ SOLN
1.0000 mg | INTRAMUSCULAR | Status: DC | PRN
  Administered 2015-12-04 – 2015-12-07 (×13): 1 mg via INTRAVENOUS
  Administered 2015-12-07: 2 mg via INTRAVENOUS
  Administered 2015-12-07 – 2015-12-10 (×8): 1 mg via INTRAVENOUS
  Filled 2015-12-03 (×21): qty 2

## 2015-12-03 MED ORDER — HYDROCODONE-ACETAMINOPHEN 7.5-325 MG/15ML PO SOLN: 10.0000 mL | ORAL | Status: DC | PRN

## 2015-12-03 MED ORDER — DIAZEPAM 1 MG/ML PO SOLN: 5.0000 mg | Freq: Three times a day (TID) | ORAL | Status: DC | PRN

## 2015-12-03 MED ORDER — LACTATED RINGERS IV BOLUS (SEPSIS)
1000.0000 mL | Freq: Once | INTRAVENOUS | Status: AC
  Administered 2015-12-03: 1000 mL via INTRAVENOUS

## 2015-12-03 MED ORDER — LACTATED RINGERS IV SOLN
INTRAVENOUS | Status: DC
  Administered 2015-12-03 (×5): via INTRAVENOUS

## 2015-12-03 MED ORDER — PROPOFOL 10 MG/ML IV BOLUS
INTRAVENOUS | Status: AC
  Filled 2015-12-03: qty 40

## 2015-12-03 MED ORDER — ROCURONIUM BROMIDE 50 MG/5ML IV SOSY
PREFILLED_SYRINGE | INTRAVENOUS | Status: AC
  Filled 2015-12-03: qty 5

## 2015-12-03 MED ORDER — METHOCARBAMOL 500 MG PO TABS: 1000.0000 mg | ORAL_TABLET | Freq: Four times a day (QID) | ORAL | Status: DC | PRN

## 2015-12-03 MED ORDER — SUCCINYLCHOLINE CHLORIDE 20 MG/ML IJ SOLN
INTRAMUSCULAR | Status: DC | PRN
  Administered 2015-12-03: 120 mg via INTRAVENOUS

## 2015-12-03 MED ORDER — FENTANYL CITRATE (PF) 100 MCG/2ML IJ SOLN
50.0000 ug | INTRAMUSCULAR | Status: AC | PRN
  Administered 2015-12-03 – 2015-12-04 (×3): 50 ug via INTRAVENOUS
  Filled 2015-12-03: qty 2

## 2015-12-03 MED ORDER — DEXTROSE 5 % IV SOLN
2.0000 g | INTRAVENOUS | Status: AC
  Administered 2015-12-03: 2 g via INTRAVENOUS
  Filled 2015-12-03: qty 2

## 2015-12-03 MED ORDER — ROCURONIUM BROMIDE 100 MG/10ML IV SOLN
INTRAVENOUS | Status: DC | PRN
  Administered 2015-12-03 (×2): 50 mg via INTRAVENOUS

## 2015-12-03 MED ORDER — FENTANYL CITRATE (PF) 100 MCG/2ML IJ SOLN: 25.0000 ug | INTRAMUSCULAR | Status: DC | PRN

## 2015-12-03 MED ORDER — SUCCINYLCHOLINE CHLORIDE 20 MG/ML IJ SOLN
INTRAMUSCULAR | Status: AC
  Filled 2015-12-03: qty 1

## 2015-12-03 MED ORDER — ACETAMINOPHEN 160 MG/5ML PO SOLN
500.0000 mg | Freq: Four times a day (QID) | ORAL | Status: DC
  Administered 2015-12-03: 500 mg
  Filled 2015-12-03: qty 20.3

## 2015-12-03 MED ORDER — PIPERACILLIN-TAZOBACTAM 3.375 G IVPB
3.3750 g | Freq: Three times a day (TID) | INTRAVENOUS | Status: DC
  Administered 2015-12-04 – 2015-12-15 (×33): 3.375 g via INTRAVENOUS
  Filled 2015-12-03 (×34): qty 50

## 2015-12-03 MED ORDER — BUPIVACAINE HCL (PF) 0.25 % IJ SOLN
INTRAMUSCULAR | Status: AC
  Filled 2015-12-03: qty 60

## 2015-12-03 MED ORDER — METOCLOPRAMIDE HCL 5 MG/5ML PO SOLN
5.0000 mg | Freq: Three times a day (TID) | ORAL | Status: DC
  Administered 2015-12-03 (×2): 5 mg
  Filled 2015-12-03 (×3): qty 5

## 2015-12-03 MED ORDER — PHENYLEPHRINE HCL 10 MG/ML IJ SOLN
INTRAMUSCULAR | Status: AC
  Filled 2015-12-03: qty 1

## 2015-12-03 MED ORDER — PANTOPRAZOLE SODIUM 40 MG IV SOLR
40.0000 mg | Freq: Two times a day (BID) | INTRAVENOUS | Status: DC
  Administered 2015-12-04 – 2015-12-08 (×11): 40 mg via INTRAVENOUS
  Filled 2015-12-03 (×11): qty 40

## 2015-12-03 MED ORDER — DEXTROSE 10 % IV SOLN
INTRAVENOUS | Status: DC
  Administered 2015-12-03: 83 mL/h via INTRAVENOUS
  Filled 2015-12-03: qty 1000

## 2015-12-03 MED ORDER — DIAZEPAM 5 MG/ML IJ SOLN: 2.5000 mg | Freq: Three times a day (TID) | INTRAMUSCULAR | Status: DC | PRN

## 2015-12-03 MED ORDER — FENTANYL CITRATE (PF) 100 MCG/2ML IJ SOLN
50.0000 ug | INTRAMUSCULAR | Status: DC | PRN
  Administered 2015-12-04 – 2015-12-07 (×14): 50 ug via INTRAVENOUS
  Filled 2015-12-03 (×11): qty 2

## 2015-12-03 MED ORDER — DIAZEPAM 1 MG/ML PO SOLN: 1.0000 mg | Freq: Every evening | ORAL | Status: DC | PRN

## 2015-12-03 MED ORDER — PHENYLEPHRINE HCL 10 MG/ML IJ SOLN
INTRAVENOUS | Status: DC | PRN
  Administered 2015-12-03: 30 ug/min via INTRAVENOUS

## 2015-12-03 MED ORDER — FENTANYL CITRATE (PF) 100 MCG/2ML IJ SOLN
25.0000 ug | INTRAMUSCULAR | Status: DC | PRN
  Administered 2015-12-06 – 2015-12-07 (×4): 50 ug via INTRAVENOUS
  Filled 2015-12-03 (×11): qty 2

## 2015-12-03 MED ORDER — SODIUM CHLORIDE 0.9 % IJ SOLN
INTRAMUSCULAR | Status: AC
  Filled 2015-12-03: qty 10

## 2015-12-03 MED ORDER — LACTATED RINGERS IV SOLN
INTRAVENOUS | Status: DC | PRN
  Administered 2015-12-03 (×2): via INTRAVENOUS

## 2015-12-03 MED ORDER — SUFENTANIL CITRATE 50 MCG/ML IV SOLN
INTRAVENOUS | Status: AC
  Filled 2015-12-03: qty 1

## 2015-12-03 MED ORDER — ROCURONIUM BROMIDE 50 MG/5ML IV SOSY
PREFILLED_SYRINGE | INTRAVENOUS | Status: AC
  Filled 2015-12-03: qty 10

## 2015-12-03 MED ORDER — DEXMEDETOMIDINE HCL IN NACL 400 MCG/100ML IV SOLN
0.0000 ug/kg/h | INTRAVENOUS | Status: AC
  Administered 2015-12-04: 0.9 ug/kg/h via INTRAVENOUS
  Administered 2015-12-04: 0.4 ug/kg/h via INTRAVENOUS
  Administered 2015-12-04: 0.7 ug/kg/h via INTRAVENOUS
  Administered 2015-12-04: 0.6 ug/kg/h via INTRAVENOUS
  Administered 2015-12-04: 0.8 ug/kg/h via INTRAVENOUS
  Administered 2015-12-04: 1 ug/kg/h via INTRAVENOUS
  Administered 2015-12-04: 0.6 ug/kg/h via INTRAVENOUS
  Administered 2015-12-04: 0.9 ug/kg/h via INTRAVENOUS
  Administered 2015-12-04: 0.6 ug/kg/h via INTRAVENOUS
  Administered 2015-12-05 – 2015-12-06 (×8): 1 ug/kg/h via INTRAVENOUS
  Filled 2015-12-03: qty 50
  Filled 2015-12-03 (×2): qty 100
  Filled 2015-12-03 (×2): qty 50
  Filled 2015-12-03: qty 100
  Filled 2015-12-03: qty 50
  Filled 2015-12-03 (×4): qty 100
  Filled 2015-12-03: qty 50
  Filled 2015-12-03: qty 100
  Filled 2015-12-03: qty 50
  Filled 2015-12-03: qty 100
  Filled 2015-12-03 (×2): qty 50
  Filled 2015-12-03: qty 100
  Filled 2015-12-03 (×2): qty 50
  Filled 2015-12-03: qty 100

## 2015-12-03 MED ORDER — MIDAZOLAM HCL 2 MG/2ML IJ SOLN
INTRAMUSCULAR | Status: AC
  Filled 2015-12-03: qty 2

## 2015-12-03 MED ORDER — LACTATED RINGERS IR SOLN
Status: DC | PRN
  Administered 2015-12-03: 21000 mL

## 2015-12-03 MED ORDER — ONDANSETRON HCL 4 MG/2ML IJ SOLN
INTRAMUSCULAR | Status: AC
  Filled 2015-12-03: qty 2

## 2015-12-03 MED ORDER — SODIUM CHLORIDE 0.9 % IV SOLN
INTRAVENOUS | Status: DC | PRN
  Administered 2015-12-03: 22:00:00 via INTRAVENOUS

## 2015-12-03 MED ORDER — PHENYLEPHRINE 40 MCG/ML (10ML) SYRINGE FOR IV PUSH (FOR BLOOD PRESSURE SUPPORT)
PREFILLED_SYRINGE | INTRAVENOUS | Status: AC
Start: 2015-12-03 — End: 2015-12-03
  Filled 2015-12-03: qty 10

## 2015-12-03 MED ORDER — DEXTROSE 10 % IV SOLN
INTRAVENOUS | Status: DC
  Administered 2015-12-03: 15:00:00 via INTRAVENOUS
  Filled 2015-12-03: qty 1000

## 2015-12-03 MED ORDER — INSULIN ASPART 100 UNIT/ML ~~LOC~~ SOLN
0.0000 [IU] | SUBCUTANEOUS | Status: DC
  Administered 2015-12-09 (×3): 1 [IU] via SUBCUTANEOUS
  Administered 2015-12-10: 2 [IU] via SUBCUTANEOUS
  Administered 2015-12-10: 1 [IU] via SUBCUTANEOUS
  Administered 2015-12-10: 2 [IU] via SUBCUTANEOUS
  Administered 2015-12-10 – 2015-12-12 (×9): 1 [IU] via SUBCUTANEOUS
  Administered 2015-12-12: 2 [IU] via SUBCUTANEOUS
  Administered 2015-12-12: 1 [IU] via SUBCUTANEOUS
  Administered 2015-12-12: 2 [IU] via SUBCUTANEOUS
  Administered 2015-12-12: 1 [IU] via SUBCUTANEOUS
  Administered 2015-12-12 – 2015-12-13 (×4): 2 [IU] via SUBCUTANEOUS
  Administered 2015-12-13 – 2015-12-22 (×42): 1 [IU] via SUBCUTANEOUS

## 2015-12-03 MED ORDER — ALBUMIN HUMAN 5 % IV SOLN
INTRAVENOUS | Status: DC | PRN
  Administered 2015-12-03 (×2): via INTRAVENOUS

## 2015-12-03 MED ORDER — METHOCARBAMOL 1000 MG/10ML IJ SOLN: 1000.0000 mg | Freq: Four times a day (QID) | INTRAVENOUS | Status: DC | PRN

## 2015-12-03 MED ORDER — PROPOFOL 10 MG/ML IV BOLUS
INTRAVENOUS | Status: DC | PRN
  Administered 2015-12-03: 120 mg via INTRAVENOUS

## 2015-12-03 MED ORDER — SUFENTANIL CITRATE 50 MCG/ML IV SOLN
INTRAVENOUS | Status: DC | PRN
  Administered 2015-12-03 (×2): 15 ug via INTRAVENOUS
  Administered 2015-12-03 (×3): 10 ug via INTRAVENOUS
  Administered 2015-12-03: 15 ug via INTRAVENOUS
  Administered 2015-12-03: 10 ug via INTRAVENOUS
  Administered 2015-12-03: 5 ug via INTRAVENOUS
  Administered 2015-12-03: 10 ug via INTRAVENOUS
  Administered 2015-12-03: 5 ug via INTRAVENOUS
  Administered 2015-12-03 (×3): 10 ug via INTRAVENOUS
  Administered 2015-12-03: 15 ug via INTRAVENOUS

## 2015-12-03 MED ORDER — METHOCARBAMOL 500 MG PO TABS: 500.0000 mg | ORAL_TABLET | Freq: Four times a day (QID) | ORAL | Status: DC | PRN

## 2015-12-03 MED ORDER — BISACODYL 10 MG RE SUPP
10.0000 mg | Freq: Every day | RECTAL | Status: DC
  Administered 2015-12-03 – 2015-12-12 (×9): 10 mg via RECTAL
  Filled 2015-12-03 (×10): qty 1

## 2015-12-03 MED ORDER — MIDAZOLAM HCL 5 MG/5ML IJ SOLN
INTRAMUSCULAR | Status: DC | PRN
  Administered 2015-12-03: 2 mg via INTRAVENOUS

## 2015-12-03 MED ORDER — CEFOTETAN DISODIUM-DEXTROSE 2-2.08 GM-% IV SOLR
INTRAVENOUS | Status: AC
  Filled 2015-12-03: qty 50

## 2015-12-03 MED ORDER — DEXTROSE 5 % IV SOLN
500.0000 mg | Freq: Four times a day (QID) | INTRAVENOUS | Status: DC | PRN
  Administered 2015-12-03: 500 mg via INTRAVENOUS
  Filled 2015-12-03: qty 550
  Filled 2015-12-03: qty 5

## 2015-12-03 MED ORDER — ONDANSETRON HCL 4 MG/2ML IJ SOLN: 4.0000 mg | Freq: Once | INTRAMUSCULAR | Status: DC | PRN

## 2015-12-03 MED ORDER — MIDAZOLAM HCL 2 MG/2ML IJ SOLN
1.0000 mg | INTRAMUSCULAR | Status: DC | PRN
  Filled 2015-12-03 (×4): qty 2

## 2015-12-03 MED ORDER — ACETAMINOPHEN 160 MG/5ML PO SOLN
650.0000 mg | Freq: Four times a day (QID) | ORAL | Status: DC
  Administered 2015-12-03 (×2): 650 mg
  Filled 2015-12-03 (×2): qty 20.3

## 2015-12-03 MED ORDER — FAMOTIDINE IN NACL 20-0.9 MG/50ML-% IV SOLN: 20.0000 mg | Freq: Two times a day (BID) | INTRAVENOUS | Status: DC

## 2015-12-03 MED ORDER — LACTATED RINGERS IV BOLUS (SEPSIS): 1000.0000 mL | Freq: Three times a day (TID) | INTRAVENOUS | Status: DC | PRN

## 2015-12-03 MED ORDER — PHENYLEPHRINE HCL 10 MG/ML IJ SOLN
INTRAMUSCULAR | Status: DC | PRN
  Administered 2015-12-03 (×2): 80 ug via INTRAVENOUS

## 2015-12-03 SURGICAL SUPPLY — 71 items
BLADE EXTENDED COATED 6.5IN (ELECTRODE) IMPLANT
BLADE HEX COATED 2.75 (ELECTRODE) IMPLANT
CABLE HIGH FREQUENCY MONO STRZ (ELECTRODE) ×3 IMPLANT
CATH KIT ON-Q SILVERSOAK 7.5 (CATHETERS) IMPLANT
CATH KIT ON-Q SILVERSOAK 7.5IN (CATHETERS) IMPLANT
CHLORAPREP W/TINT 26ML (MISCELLANEOUS) ×3 IMPLANT
COUNTER NEEDLE 20 DBL MAG RED (NEEDLE) ×3 IMPLANT
COVER MAYO STAND STRL (DRAPES) ×3 IMPLANT
COVER SURGICAL LIGHT HANDLE (MISCELLANEOUS) ×3 IMPLANT
DECANTER SPIKE VIAL GLASS SM (MISCELLANEOUS) ×3 IMPLANT
DEVICE TROCAR PUNCTURE CLOSURE (ENDOMECHANICALS) ×2 IMPLANT
DRAIN CHANNEL 19F RND (DRAIN) ×6 IMPLANT
DRAPE LAPAROSCOPIC ABDOMINAL (DRAPES) ×3 IMPLANT
DRAPE SHEET LG 3/4 BI-LAMINATE (DRAPES) ×3 IMPLANT
DRAPE UTILITY XL STRL (DRAPES) ×4 IMPLANT
DRAPE WARM FLUID 44X44 (DRAPE) ×3 IMPLANT
DRSG OPSITE POSTOP 4X10 (GAUZE/BANDAGES/DRESSINGS) IMPLANT
DRSG OPSITE POSTOP 4X6 (GAUZE/BANDAGES/DRESSINGS) IMPLANT
DRSG OPSITE POSTOP 4X8 (GAUZE/BANDAGES/DRESSINGS) IMPLANT
DRSG TEGADERM 2-3/8X2-3/4 SM (GAUZE/BANDAGES/DRESSINGS) IMPLANT
DRSG TEGADERM 4X4.75 (GAUZE/BANDAGES/DRESSINGS) ×2 IMPLANT
ELECT PENCIL ROCKER SW 15FT (MISCELLANEOUS) ×3 IMPLANT
ELECT REM PT RETURN 9FT ADLT (ELECTROSURGICAL) ×3
ELECTRODE REM PT RTRN 9FT ADLT (ELECTROSURGICAL) ×1 IMPLANT
EVACUATOR SILICONE 100CC (DRAIN) ×6 IMPLANT
GAUZE SPONGE 2X2 8PLY STRL LF (GAUZE/BANDAGES/DRESSINGS) ×1 IMPLANT
GAUZE SPONGE 4X4 12PLY STRL (GAUZE/BANDAGES/DRESSINGS) ×3 IMPLANT
GLOVE ECLIPSE 8.0 STRL XLNG CF (GLOVE) ×6 IMPLANT
GLOVE INDICATOR 8.0 STRL GRN (GLOVE) ×18 IMPLANT
GOWN STRL REUS W/TWL XL LVL3 (GOWN DISPOSABLE) ×11 IMPLANT
HANDLE SUCTION POOLE (INSTRUMENTS) ×1 IMPLANT
IRRIG SUCT STRYKERFLOW 2 WTIP (MISCELLANEOUS) ×3
IRRIGATION SUCT STRKRFLW 2 WTP (MISCELLANEOUS) ×1 IMPLANT
KIT BASIN OR (CUSTOM PROCEDURE TRAY) ×1 IMPLANT
LEGGING LITHOTOMY PAIR STRL (DRAPES) IMPLANT
PACK GENERAL/GYN (CUSTOM PROCEDURE TRAY) ×3 IMPLANT
PAD POSITIONING PINK XL (MISCELLANEOUS) ×3 IMPLANT
POSITIONER SURGICAL ARM (MISCELLANEOUS) IMPLANT
SCISSORS LAP 5X35 DISP (ENDOMECHANICALS) ×3 IMPLANT
SEALER TISSUE G2 CVD JAW 35 (ENDOMECHANICALS) IMPLANT
SEALER TISSUE G2 CVD JAW 45CM (ENDOMECHANICALS)
SEALER TISSUE G2 STRG ARTC 35C (ENDOMECHANICALS) IMPLANT
SEALER TISSUE X1 CVD JAW (INSTRUMENTS) IMPLANT
SLEEVE XCEL OPT CAN 5 100 (ENDOMECHANICALS) ×6 IMPLANT
SPONGE GAUZE 2X2 STER 10/PKG (GAUZE/BANDAGES/DRESSINGS)
STAPLER VISISTAT 35W (STAPLE) ×3 IMPLANT
SUCTION POOLE HANDLE (INSTRUMENTS) ×3
SUT ETHILON 2 0 PS N (SUTURE) ×6 IMPLANT
SUT MNCRL AB 4-0 PS2 18 (SUTURE) ×3 IMPLANT
SUT PDS AB 1 CTX 36 (SUTURE) IMPLANT
SUT PDS AB 1 TP1 96 (SUTURE) IMPLANT
SUT PROLENE 0 SH 30 (SUTURE) ×2 IMPLANT
SUT PROLENE 2 0 SH DA (SUTURE) ×4 IMPLANT
SUT SILK 2 0 (SUTURE)
SUT SILK 2 0 SH (SUTURE) ×8 IMPLANT
SUT SILK 2 0 SH CR/8 (SUTURE) ×1 IMPLANT
SUT SILK 2-0 18XBRD TIE 12 (SUTURE) ×1 IMPLANT
SUT SILK 3 0 (SUTURE)
SUT SILK 3 0 SH CR/8 (SUTURE) ×1 IMPLANT
SUT SILK 3-0 18XBRD TIE 12 (SUTURE) ×1 IMPLANT
SUT VICRYL 0 UR6 27IN ABS (SUTURE) IMPLANT
TAPE UMBILICAL COTTON 1/8X30 (MISCELLANEOUS) ×3 IMPLANT
TOWEL OR 17X26 10 PK STRL BLUE (TOWEL DISPOSABLE) ×4 IMPLANT
TOWEL OR NON WOVEN STRL DISP B (DISPOSABLE) ×4 IMPLANT
TRAY FOLEY W/METER SILVER 16FR (SET/KITS/TRAYS/PACK) ×1 IMPLANT
TRAY LAPAROSCOPIC (CUSTOM PROCEDURE TRAY) ×1 IMPLANT
TROCAR BLADELESS OPT 5 100 (ENDOMECHANICALS) ×3 IMPLANT
TROCAR XCEL NON-BLD 11X100MML (ENDOMECHANICALS) IMPLANT
TUBING INSUF HEATED (TUBING) ×3 IMPLANT
URINEMETER 200ML W/220 (MISCELLANEOUS) ×2 IMPLANT
YANKAUER SUCT BULB TIP 10FT TU (MISCELLANEOUS) ×3 IMPLANT

## 2015-12-03 NOTE — Progress Notes (Signed)
Pt pulled out PICC line.  Notified Dr. Carolynne Edouardoth, ordered to place PICC by IV nurse., But they don't place PICC at night and TNA was t;hrown out by IV nurse. On list for PICC placement in am.  Has peripheral IV linefor pain medications.

## 2015-12-03 NOTE — Op Note (Addendum)
12/03/2015  PATIENT:  [name]  75 y.o. male  Patient Care Team: [name], MD as PCP - General (Internal Medicine) Karie Soda, MD as Consulting Physician (General Surgery) Willis Modena, MD as Consulting Physician (Gastroenterology)  PRE-OPERATIVE DIAGNOSIS:  Peritonitis  POST-OPERATIVE DIAGNOSIS:    Peritonitis Jejunal disruption Gastric leak  PROCEDURE:    LAPAROSCOPY DIAGNOSTIC OMENTOPEXY of jejunal disruption WASH OUT x 21L with drains placement  SURGEON:  Surgeon(s): Karie Soda, MD  ASSISTANT: RN   ANESTHESIA:   local and general  EBL:  Total I/O In: 6500 [I.V.:6000; IV Piggyback:500] Out: 150 [Urine:100; Blood:50]  Delay start of Pharmacological VTE agent (>24hrs) due to surgical blood loss or risk of bleeding:  no  DRAINS: 19Fr Blake drains x3  Exit site RUQ = "LUQ" Exit site R flank = "Right" Exit site RLQ = "Pelvis"  SPECIMEN:  none  DISPOSITION OF SPECIMEN:  n/a COUNTS:  YES  PLAN OF CARE: Admit to inpatient   PATIENT DISPOSITION: ICU: critical  INDICATION: Patient status post balloon dilation and tube gastrostomy and feeding jejunostomy for recurrent stricturing and gastroduodenal anastomosis Billroth I.  History of ventricular two months before.  Patient became agitated the night of postoperative day two pulling out PICC line and pulling and gastrostomy and jejunostomy tubes.  Pullout gastrostomy tube.  Gastrostomy tube replaced with smaller tube.  Contrast study showed stomach.  A shin with worsening distention and abdominal pain the following day.  I recommended laparoscopic possible open exploration to rule out leak or peritonitis.  The anatomy & physiology of the digestive tract was discussed.  The pathophysiology of perforation was discussed.  Differential diagnosis such as perforated ulcer or colon, etc was discussed.   Natural history risks without surgery such as death was discussed.  I recommended abdominal exploration to  diagnose & treat the source of the problem.  Laparoscopic & open techniques were discussed.   Risks such as bleeding, infection, abscess, leak, reoperation, bowel resection, possible ostomy, injury to other organs, need for repair of tissues / organs, hernia, heart attack, death, and other risks were discussed.   The risks of no intervention will lead to serious problems including death.   I expressed a good likelihood that surgery will address the problem.    Goals of post-operative recovery were discussed as well.  We will work to minimize complications although risks in an emergent setting are high.   Questions were answered.  The patient's wife & daughter expressed understanding & wishes to proceed with surgery.      OR FINDINGS:  Bilious peritonitis focused in left side of the abdomen starting at left upper quadrant and flank especially gastrostomy and jejunostomy tubes.  Less staining on right side or pelvis.  No evidence of any major inflammation at gastroduodenostomy.  OMENTOPEXY repair intact.  No evidence of perforation there.  Pinhole leak around jejunostomy tube at pulling of PDS pexy suture inferomedially.  Patched with greater omentum.  Mild laxity around gastrostomy tube but no active leak of air or fluid into the peritoneal cavity with flush.  DESCRIPTION:   The patient was identified & brought into the operating room.   He had central line and arterial placed by anesthesia. The patient was positioned supine with arms tucked. SCDs were active during the entire case. The patient underwent general anesthesia without any difficulty.  The abdomen was prepped and draped in a sterile fashion. A Surgical Timeout confirmed our plan.  I placed a 5 mA port using  optical entry in the right midabdomen through the old five [place] from two days ago.  Entry was clean.  Encountered lightly bile-stained ascites.  Under a physician in place extra ports in right upper quadrant and right lower quadrant and  umbilicus.  Aspirated moderate volume of bilious ascites.  Washed out several liters to help clear the area out.  Aspirated in the pelvis and right upper quadrant and left upper quadrant.  He some phlegmon changes around the jejunostomy tube and gastrostomy tubes.  Small bowel was edematous inflamed but cannot see any enterotomy or other abnormalities.  Visualization was a little tight at first but after aspirating the ascites can see much better.  Did copious irrigation of more saline.  I did flush the gastrostomy tube.  Leak at the skin but not around the stomach into the peritoneum.  I flushed the jejunostomy tube and could see pinhole.of fluid in the inferomedial aspect for one of the PDS sutures had pulled a little bit.  I did an omentopexy of greater omentum to patch the medial and inferior sides of the jejunum at the jejunostomy site using 2-0 silk sutures tacked transfer fashion only to help seal the area.  Flushed again in the air leak was gone.  I did more copious irrigation to clear out and wash out some of the bile peritonitis.  Total of 21 L by the end.  I placed drains as noted above.  I secured the drains with 2-0 Prolene suture.  I resecured the gastrostomy tube skin with o prolene vertical mattress sutures x3..  Secured that to umbilical tape which is wrapped around the 28 French Foley tube with large balloon acting as his G tube.  I decided to leave that in place since it seemed to be draining well.  I placed the gastrostomy and jejunostomy tubes to gravity.  Called & discussed case with critical care medicine for ICU consult.  They will help follow the patient and keep him intubated.  The patient is not on pressors.  His urine output is improved.  His blood pressure is better.  I discussed intraoperative findings as well as milestones for recovery and goals of care with the patient's family.  Noted the guarded/critical condition.  Hopefully he will stabilize can be extubated in a few days.  We  will see.  Ardeth SportsmanSteven C. Nykolas Bacallao, M.D., F.A.C.S. Gastrointestinal and Minimally Invasive Surgery [facility] [address] [place] [id number] [phone] Main / Paging  12/03/2015 10:38 PM

## 2015-12-03 NOTE — Progress Notes (Signed)
Danny  [name]., [name], [name] [id number] Phone: [phone] FAX: [phone]   [name] [id number] Jul 12, 1940  CARE TEAM:  PCP: Irven Shelling, MD  Outpatient Care Team: Patient Care Team: Lavone Orn, MD as PCP - General (Internal Medicine) Michael Boston, MD as Consulting Physician (General Surgery) Arta Silence, MD as Consulting Physician (Gastroenterology)  Inpatient Treatment Team: Treatment Team: Attending Provider: Michael Boston, MD; Consulting Physician: Arta Silence, MD; Technician: Sueanne Margarita, NT; Technician: Abbe Amsterdam, NT; Technician: Leda Quail, NT      2 Days Post-Op 12/01/2015  POST-OPERATIVE DIAGNOSIS:    RECURRENT GASTRIC OUTLET OBSTRUCTION DUE to significant edema at gastroduodenal anastomosis. GASTRITIS Failure to thrive with malnutrition  PROCEDURE:   LAPAROSCOPIC PLACEMENT OF FEEDING JEJUNOSTOMY AND GASTROSTOMY TUBEs ESOPHAGOGASTRODUODENOSCOPY (EGD) BALLOON DILATION OF DUODENAL STRICTURE Gastric biopsies  Closure of gastrotomy  SURGEON:  Michael Boston, MD  Prior surgery 09/25/2015  POST-OPERATIVE DIAGNOSIS:  Partial gastric outlet obstruction due to chronic pyloric stricture  PROCEDURE:   XI ROBOTIC distal gastrectomy BILROTH I  ANASTOMOSIS  ANTERIOR AND POSTERIOR VAGOTOMY DOR (Anterior 970 degree) FUNDIPLICATION OMENTOPEXY UPPER ENDOSCOPY  SURGEON:  Michael Boston, MD   Problem List:   Principal Problem:   Gastric outlet obstruction Active Problems:   Essential hypertension, benign   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 09/25/2015   Hyperlipidemia   Anxiety state   Gastric Ileus    Protein-calorie malnutrition, moderate (HCC)    Assessment/Plan:  GASTRIC ILEUS with recurrent stricture most likely inflammatory in nature and at the gastroduodenal anastomosis.  -TFs via J tube (LLQ 16Fr MicKey)  -G-tube  replaced the Foley balloon.  Unfortunately small 16 [place].  We will try to avoid upsizing for now.  Secured.  Abdominal binder.  May need to re-suturing.   -change narcotics.  Heat & tylenol RTC (ice doesn't work as well)  -Okay to retry clears.  Gtube to gravity for now - if tol PO & no n/v/bloating, try clamping trials  - enteral liquid meds via J tube as tolerated  -PPI, H2B  -Reglan.  Stop erythromycin given the abdominal cramping.    -Since he pulled out his PICC line, hold off on replacement and restarting TNA.    -Anxiolysis - challenge transitioning from Xanax to Valium.  We will try and give at bedtime primarily and avoid more often.  Tried using enteral dose for a little more smooth sedation.  Have a sitter.  Getting closer to the nursing station.  Sundowning precautions.    HTN control   VTE prophylaxis - SCDs  Mobilize as tolerated to help recovery   [name], M.D., F.A.C.S. Gastrointestinal and Minimally Invasive Surgery [facility] [address], [place] [address] [place], [place] [address] [phone] Main / Paging    12/03/2015   Subjective:  Woke up confused and pulled out PICC line and gastrostomy tube. Gastrostomy tube immediately replace with Foley catheter 16 [place].  Fluoroscopy study notes its in the stomach.  Patient with some crampy lower abdominal pain but nothing too severe.   Nurses just outside room  Daughter in room  Objective:  Vital signs:  Vitals:   12/03/15 0006 12/03/15 0100 12/03/15 0328 12/03/15 0550  BP: (!) 117/55 (!) 123/48 (!) 110/54 (!) 149/87  Pulse: (!) 55 71 (!) 56 64  Resp: 20 (!) 24 (!) 22 20  Temp: 98.6 F (37 C) 97.6 F (36.4 C)  TempSrc: Axillary Axillary    SpO2: 97% 95% 92% 98%  Weight:      Height:        Last BM Date: 11/30/15  Intake/Output   Yesterday:  10/18 0701 - 10/19 0700 In: 686.3 [I.V.:526.3; NG/GT:160] Out: 750 [Urine:500; Drains:250] This shift:  No  intake/output data recorded.  Bowel function:  Flatus: No  BM:  No  Drain: Thin bilious.  Gtube flushes easily.   Physical Exam:  General: Pt awake/alert/oriented x4 in No acute distress at best.  Then seems a little groggy and sleepy. Eyes: PERRL, normal EOM.  Sclera clear.  No icterus Neuro: CN II-XII intact w/o focal sensory/motor deficits. Lymph: No head/neck/groin lymphadenopathy Psych:  No delerium/psychosis/paranoia HENT: Normocephalic, Mucus membranes moist.  No thrush.  Mildly slurred speech Neck: Supple, No tracheal deviation Chest: CTAB. Chest wall pain w good excursion CV:  Pulses intact.  Regular rhythm MS: Normal AROM mjr joints.  No obvious deformity Abdomen:  G tube LUQ to gravity old blood/bile.    I resecured with extra tape.  Binder pending.J tube LLQ c/d/i.   Soft.  Nondistended.  Tenderness at Tube sites and lower abdomen..  No evidence of peritonitis.   GU:  Foley in place.  NEMG Ext:  SCDs BLE.  No mjr edema.  No cyanosis Skin: No petechiae / purpura  Results:   Labs: Results for orders placed or performed during the hospital encounter of 11/24/15 (from the past 48 hour(s))  Glucose, capillary     Status: Abnormal   Collection Time: 12/01/15 12:01 PM  Result Value Ref Range   Glucose-Capillary 113 (H) 65 - 99 mg/dL  Glucose, capillary     Status: Abnormal   Collection Time: 12/02/15 12:10 AM  Result Value Ref Range   Glucose-Capillary 163 (H) 65 - 99 mg/dL  Glucose, capillary     Status: Abnormal   Collection Time: 12/02/15  5:54 AM  Result Value Ref Range   Glucose-Capillary 144 (H) 65 - 99 mg/dL  Prealbumin     Status: Abnormal   Collection Time: 12/02/15  8:52 AM  Result Value Ref Range   Prealbumin 12.5 (L) 18 - 38 mg/dL    Comment: Performed at [facility]  Glucose, capillary     Status: Abnormal   Collection Time: 12/02/15  5:03 PM  Result Value Ref Range   Glucose-Capillary 176 (H) 65 - 99 mg/dL  Glucose, capillary     Status:  Abnormal   Collection Time: 12/02/15  5:56 PM  Result Value Ref Range   Glucose-Capillary 189 (H) 65 - 99 mg/dL  Glucose, capillary     Status: Abnormal   Collection Time: 12/02/15  8:10 PM  Result Value Ref Range   Glucose-Capillary 246 (H) 65 - 99 mg/dL   Comment 1 Notify RN    Comment 2 Document in Chart   Glucose, capillary     Status: Abnormal   Collection Time: 12/03/15 12:00 AM  Result Value Ref Range   Glucose-Capillary 193 (H) 65 - 99 mg/dL   Comment 1 Notify RN    Comment 2 Document in Chart   Glucose, capillary     Status: Abnormal   Collection Time: 12/03/15  3:22 AM  Result Value Ref Range   Glucose-Capillary 151 (H) 65 - 99 mg/dL  Comprehensive metabolic panel     Status: Abnormal   Collection Time: 12/03/15  5:12 AM  Result Value Ref Range   Sodium 134 (L) 135 - 145 mmol/L  Potassium 4.3 3.5 - 5.1 mmol/L   Chloride 104 101 - 111 mmol/L   CO2 20 (L) 22 - 32 mmol/L   Glucose, Bld 150 (H) 65 - 99 mg/dL   BUN 38 (H) 6 - 20 mg/dL   Creatinine, Ser 1.22 0.61 - 1.24 mg/dL   Calcium 8.7 (L) 8.9 - 10.3 mg/dL   Total Protein 5.7 (L) 6.5 - 8.1 g/dL   Albumin 2.6 (L) 3.5 - 5.0 g/dL   AST 27 15 - 41 U/L   ALT 62 17 - 63 U/L   Alkaline Phosphatase 78 38 - 126 U/L   Total Bilirubin 0.6 0.3 - 1.2 mg/dL   GFR calc non Af Amer 56 (L) >60 mL/min   GFR calc Af Amer >60 >60 mL/min    Comment: (NOTE) The eGFR has been calculated using the CKD EPI equation. This calculation has not been validated in all clinical situations. eGFR's persistently <60 mL/min signify possible Chronic Kidney Disease.    Anion gap 10 5 - 15  Magnesium     Status: None   Collection Time: 12/03/15  5:12 AM  Result Value Ref Range   Magnesium 1.8 1.7 - 2.4 mg/dL  Phosphorus     Status: None   Collection Time: 12/03/15  5:12 AM  Result Value Ref Range   Phosphorus 2.6 2.5 - 4.6 mg/dL  CBC     Status: Abnormal   Collection Time: 12/03/15  5:12 AM  Result Value Ref Range   WBC 12.5 (H) 4.0 - 10.5  K/uL   RBC 5.51 4.22 - 5.81 MIL/uL   Hemoglobin 17.0 13.0 - 17.0 g/dL   HCT 50.6 39.0 - 52.0 %   MCV 91.8 78.0 - 100.0 fL   MCH 30.9 26.0 - 34.0 pg   MCHC 33.6 30.0 - 36.0 g/dL   RDW 13.0 11.5 - 15.5 %   Platelets 349 150 - 400 K/uL  Glucose, capillary     Status: Abnormal   Collection Time: 12/03/15  5:59 AM  Result Value Ref Range   Glucose-Capillary 157 (H) 65 - 99 mg/dL   Comment 1 Notify RN    Comment 2 Document in Chart     Imaging / Studies: Dg Abd 1 View  Result Date: 12/02/2015 CLINICAL DATA:  75 year old male with gastrostomy tube removal and placement of a Foley catheter through the gastrostomy. EXAM: ABDOMEN - 1 VIEW COMPARISON:  Abdominal radiograph dated 11/29/2015 and CT dated 11/25/2015 FINDINGS: A percutaneous catheter is noted in the left upper abdomen. 25 cc of Gastrografin was administered through the catheter which appears to opacified the stomach. The tip of the catheter and the balloon noted within the stomach. Small amount of contrast noted extending along the tube and over the patient's skin. There has been interval resolution of the previously seen gastric distention. There is no bowel dilatation or evidence of obstruction. No free air identified. Right upper quadrant cholecystectomy clips noted. The soft tissues and osseous structures are unremarkable. IMPRESSION: Percutaneously placed Foley catheter through the gastrostomy with tip and balloon appear in the stomach. Interval resolution of the previously seen air distended stomach. Electronically Signed   By: Anner Crete M.D.   On: 12/02/2015 22:49    Medications / Allergies: per chart  Antibiotics: Anti-infectives    Start     Dose/Rate Route Frequency Ordered Stop   12/02/15 0000  erythromycin ethylsuccinate (EES) 200 MG/5ML suspension 400 mg    Comments:  Administer via J tube (16Fr LLQ)  400 mg Oral Every 6 hours 12/01/15 2020     11/27/15 1200  erythromycin 500 mg in sodium chloride 0.9 % 100 mL  IVPB     500 mg 100 mL/hr over 60 Minutes Intravenous Every 6 hours 11/27/15 0940 11/29/15 0608   11/26/15 0830  erythromycin 250 mg in sodium chloride 0.9 % 100 mL IVPB  Status:  Discontinued     250 mg 100 mL/hr over 60 Minutes Intravenous Every 6 hours 11/26/15 0751 11/27/15 0940        Note: Portions of this report may have been transcribed using voice recognition software. Every effort was made to ensure accuracy; however, inadvertent computerized transcription errors may be present.   Any transcriptional errors that result from this process are unintentional.    [name], M.D., F.A.C.S. Gastrointestinal and Minimally Invasive Surgery [facility] [address], [place], [place] [address] [phone] Main / Paging     12/03/2015

## 2015-12-03 NOTE — Progress Notes (Signed)
PHARMACY - ADULT TOTAL PARENTERAL NUTRITION CONSULT NOTE   Pharmacy Consult for TPN Indication: Gastric ileus / obstruction  Patient Measurements: Height: 6' (182.9 cm) Weight: 180 lb 14.4 oz (82.1 kg) IBW/kg (Calculated) : 77.6 TPN AdjBW (KG): 81.4 Body mass index is 24.53 kg/m. Usual Weight:   Insulin Requirements: 6 units yesterday  Current Nutrition: NPO  IVF: D5W + 1/2NS with KCl 41mEq/L at 20 ml/hr  Central access: PICC placed 10/12 TPN start date: 10/12  ASSESSMENT                                                                                                          HPI: 52 yoM admitted 10/9 with new upper abdominal discomfort and bloating with concern for recurrent GOO due to chronic pyloric stricture. 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  Pharmacy consulted to start TPN for gastric ileus and obstruction 10/12.   Significant events:  10/18:  Lap placement of feeding jejunostomy and gastrostomy tubes done 10/17, to start TFs today and increase rate as tolerated per orders and hopeful to wean TPN off soon 10/19 Pt pulled out PICC, D10 started at 83 ml/hr   Today:   Glucose - No hx DM noted. CBGs at goal. Note that D10W was hung for a while as TPN ran out while patient was getting feeding tube placed but highest CBG still only 163  Electrolytes - lytes WNL. CorrCa 9.3.  Renal -Scr increasing  LFTs - improving   TGs -76 (10/13), 97 (10/16)  Prealbumin - 17.8 (10/11), 12.8 (10/13), 11 (10/16) 12.5 (10/18)  NUTRITIONAL GOALS                                                                                             RD recs: 2250-2450 Kcal/day, 110-120g protein/day, 2.2-2.4L fluid/day  Clinimix 5/20 at a goal rate of 59ml/hr + 20% fat emulsion at 66ml/hr to provide: 108g/day protein, 2380Kcal/day.  **There is currently a Acupuncturist of Clinimix solution.  To conserve supply, will consider goal  rate of Clinimix 5/20 at 83 ml/min (provides 2233 Kcal/day and 100g protein/day) which will keep total volume at 2L rather than increasing use to 3L.   PLAN  Pt pulled out PICC, TPN d/c'd per surgery  Pt receiving tube feeds   IVF per surgery  Cont sensitive SSI q6h.   D/c TPN lab panels    Arley Phenixllen Furman Trentman RPh 12/03/2015, 11:14 AM Pager [phone]

## 2015-12-03 NOTE — Progress Notes (Signed)
Pt has been unable to urinate today. Bladder scan performed at 0900 revealed the pt withholding 374cc of urine. I & O cath was performed 10/18, but urinary retention has continued and per patient and family is becoming more painful. MD orders in chart to leave foley in pt after the need for second I & O catheter. Therefore 16 french indwelling catheter placed by speaker with assistance of Dawn, RN- charge. 150cc of dark amber urine output immediately out of foley. Follow up bladder scan revealed another 148cc remaining in bladder. Foley flushed with 10cc NS. Will continue to monitor.

## 2015-12-03 NOTE — Progress Notes (Signed)
order/instruction: Place foley cath in G-Tube4-5 inches to keep stoma open and inflateBaloon.Placed around nine pm.

## 2015-12-03 NOTE — Evaluation (Signed)
Physical Therapy Evaluation Patient Details Name: [name] MRN: [id number] DOB: [birth year] Today's Date: 12/03/2015   History of Present Illness  75 yo male s/p placement J and G tubes 10/17. Hx of partial gastrectomy 09/2015  Clinical Impression  Upon entering room, sitter present and reports pt has been leaking from tubes. Noted pt's gown, boxers, pillow/lines all soaked with fluid from tubes. Pt stood x1 from recliner, attempted to use urinal(unsuccessfully), and soaked boxers were removed. He was only able to stand for ~45-seconds to 1 minute before needing to sit back down. Sitter reported pt had been sitting all morning so decided to assist him back to bed. NT entered and helped therapist return pt to bed. Nurse entered and began helping with assessing tubes and pt. On eval, pt required Min assist (+2 for safety) for mobility. Eval limited due to leaking tubes, weakness, and fatigue. Will continue to follow and progress activity as tolerated.     Follow Up Recommendations Home health PT;Supervision/Assistance - 24 hour (depending on progress)    Equipment Recommendations   (TBD)    Recommendations for Other Services       Precautions / Restrictions Precautions Precautions: Fall Precaution Comments: J tube, G tube. multiple lines Restrictions Weight Bearing Restrictions: No      Mobility  Bed Mobility Overal bed mobility: Needs Assistance Bed Mobility: Sit to Supine       Sit to supine: Min assist;+2 for safety/equipment   General bed mobility comments: +2 safety for multiple lines/tubes  Transfers Overall transfer level: Needs assistance Equipment used: Rolling walker (2 wheeled) Transfers: Sit to/from UGI CorporationStand;Stand Pivot Transfers Sit to Stand: Min assist Stand pivot transfers: Min assist       General transfer comment: x2. VCs hand placement. Assist to rise, stabilize, control descent. Stand pivot, recliner to bed, with RW.   Ambulation/Gait              General Gait Details: NT-due to dyspnea, weakness, tube leaking  Stairs            Wheelchair Mobility    Modified Rankin (Stroke Patients Only)       Balance Overall balance assessment: Needs assistance           Standing balance-Leahy Scale: Poor                               Pertinent Vitals/Pain Pain Assessment: Faces Faces Pain Scale: Hurts little more Pain Location: abdomen Pain Descriptors / Indicators: Grimacing;Sore Pain Intervention(s): Limited activity within patient's tolerance;Repositioned    Home Living Family/patient expects to be discharged to:: Private residence Living Arrangements: Spouse/significant other Available Help at Discharge: Family Type of Home: House Home Access: Stairs to enter Entrance Stairs-Rails: LawyerLeft;Right Entrance Stairs-Number of Steps: 3 Home Layout: One level Home Equipment: None      Prior Function Level of Independence: Independent               Hand Dominance        Extremity/Trunk Assessment   Upper Extremity Assessment: Generalized weakness           Lower Extremity Assessment: Generalized weakness      Cervical / Trunk Assessment: Normal  Communication   Communication: No difficulties  Cognition Arousal/Alertness: Lethargic (mildly lethargic) Behavior During Therapy: WFL for tasks assessed/performed Overall Cognitive Status: Within Functional Limits for tasks assessed  General Comments      Exercises     Assessment/Plan    PT Assessment Patient needs continued PT services  PT Problem List Decreased strength;Decreased mobility;Decreased balance;Decreased activity tolerance;Decreased knowledge of use of DME;Pain          PT Treatment Interventions DME instruction;Therapeutic activities;Therapeutic exercise;Functional mobility training;Gait training;Patient/family education;Balance training    PT Goals (Current goals can be found in  the Care Plan section)  Acute Rehab PT Goals Patient Stated Goal: none stated PT Goal Formulation: With patient/family Time For Goal Achievement: 12/17/15 Potential to Achieve Goals: Good    Frequency Min 3X/week   Barriers to discharge        Co-evaluation               End of Session Equipment Utilized During Treatment: Oxygen Activity Tolerance: Patient limited by fatigue Patient left: in bed;with call bell/phone within reach;with family/visitor present;with nursing/sitter in room           Time: 1142-1200 PT Time Calculation (min) (ACUTE ONLY): 18 min   Charges:   PT Evaluation $PT Eval Low Complexity: 1 Procedure     PT G Codes:        Rebeca Alert, MPT Pager: [phone]

## 2015-12-03 NOTE — Progress Notes (Signed)
Nutrition Follow-up  DOCUMENTATION CODES:   Severe malnutrition in context of acute illness/injury  INTERVENTION:   Continue Osmolite 1.5 @ 30 ml/hr via J-tube and increase by 10 ml every 8 hours to goal rate of 65 ml/hr.  30 ml Prostat daily.   Tube feeding regimen provides 2440kcal (100% of needs), 112grams of protein, and 1188ml of H2O.  RD to follow-up 10/20  NUTRITION DIAGNOSIS:   Inadequate oral intake related to inability to eat as evidenced by NPO status.  Ongoing.  GOAL:   Patient will meet greater than or equal to 90% of their needs  Not meeting.  MONITOR:   Labs, Weight trends, TF tolerance, Skin, I & O's  ASSESSMENT:   [age] year old male presenting for a post-operative visit. Note for "Post-Operative": Patient returns one month status post robotic resection.  Distal gastrectomy with Billroth I gastroduodenal reconstruction for chronic ulcer causing obstruction.  09/25/2015  Patient pulled out PICC and G-tube overnight. Pt with increased confusion. TPN not planned to restart. Currently receiving Osmolite 1.5 @ 30 ml/hr and 30 ml Prostat via j-tube, providing 1180 kcal and 60g protein. In total pt receiving from TF + IVF:1938 kcal and 60g protein.  Nurse techs at bedside report pt was tolerating feeds. However, pt is leaking from the G-tube site. Would advance as able towards goal rate of 65 ml/hr.  Medications: Dulcolax suppository, Reglan solution TID, D10 infusion at 83 ml/hr -provides 677 kcal, D5 and .45% NaCl w/ KCl infusion at 20 ml/hr -provides 81 kcal Labs reviewed: CBGs: 151-157 Low Na Mg/Phos/K WNL  Diet Order:  Diet clear liquid Room service appropriate? Yes; Fluid consistency: Thin; Fluid restriction: 1200 mL Fluid  Skin:  Wound (see comment) (10/17 abdominal incision)  Last BM:  10/16  Height:   Ht Readings from Last 1 Encounters:  11/24/15 6' (1.829 m)    Weight:   Wt Readings from Last 1 Encounters:  11/26/15 180 lb 14.4 oz (82.1  kg)    Ideal Body Weight:  80.9 kg  BMI:  Body mass index is 24.53 kg/m.  Estimated Nutritional Needs:   Kcal:  2250-2450  Protein:  110-120g  Fluid:  2.2-2.4L/day  EDUCATION NEEDS:   No education needs identified at this time  Danny FrancoLindsey Vester Balthazor, MS, RD, LDN Pager: [phone] After Hours Pager: [phone]

## 2015-12-03 NOTE — Consult Note (Signed)
PULMONARY / CRITICAL CARE MEDICINE   Name: [name] MRN: [id number] DOB: 1940-10-28    ADMISSION DATE:  11/24/2015 CONSULTATION DATE:  12/03/2015  REFERRING MD:  Dr. Michaell Cowing  CHIEF COMPLAINT:  Gastric outlet obstruction.  HISTORY OF PRESENT ILLNESS:   75 year old male with PMH as below, which is significant for CAD, GERD, hypertension, hyperlipidemia. More recently, he developed gradual onset of intermittent epigastric pain for about 1 year. Initially treated for H. pylori with triple antibotic therapy, however symptoms persisted. He was evaluated by gastroenterology with endoscopy in January 2017, which demonstrated partial gastric outlet obstruction with pyloric channel ulcer and was treated with Carafate and pantoprazole. He had a follow-up endoscopy in May 2017 demonstrating no improvement. All biopsies were negative for malignancy. Overall, his condition progressed to include intermittent episodes of nausea, vomiting, and bloating. In August 2017 he was admitted related to partial gastric outlet obstruction secondary to chronic pyloric stricture and underwent distal gastrectomy with Billroth I anastomosis, anterior and posterior vagotomy, DOR fundoplication, and omentopexy. He progressed and was able to start taking by mouth's again. He presented again in September 2017 with complaints of abdominal bloating which was thought to be gastroparesis, he declined admission at that time and was recommended to continue a liquid diet.   11/23/2015 he can presented to [facility] emergency department with complaints of worsening abdominal distention with associated cramping heartburn. Abdominal CT demonstrated gastric dilation concerning for recurrent stricture. He underwent endoscopy which is suspicious for tightening at the site of the anastomosis with additional concerns for recurrent ulcer. Initially he was treated conservatively with NG tube decompression, IV proton pump inhibitor, H2  blocker, and Reglan. He was kept nothing by mouth. Despite these measures, his symptoms did not improve and he was taken to the OR 10/17 and underwent laparoscopic placement of feeding jejunostomy and gastrostomy tubes and EGD balloon dilation of duodenal stricture. 10/18 his jejunostomy tube became displaced and was removed. It was replaced with Foley catheter, however, there was a significant amount of gastric leakage. By 10/19 he began to relatively decompensate with worsening abdominal pain and increased respiratory rate with concern for peritonitis. He was taken again to the operating room for abdominal exploration and washout. During the procedure he was found have bilious peritonitis and pinhole leak around jejunostomy at pulling of suture. Post operatively he remained on ventilator and was taken to ICU for recovery. PCCM to see for further evaluation.  PAST MEDICAL HISTORY :  He  has a past medical history of Bilateral dry eyes; Coronary artery disease (1997); Degenerative disc disease; Dysphonia; ED (erectile dysfunction); Gastroesophageal reflux disease; Hyperlipidemia; Hypertension; Peptic ulcer; Pneumonia; Posterior vitreous detachment (10/30/2012); Urinary hesitancy; and Wears glasses.  PAST SURGICAL HISTORY: He  has a past surgical history that includes STENT TO HEART; Cholecystectomy; Esophagogastroduodenoscopy (egd) with propofol (N/A, 07/01/2015); Tonsillectomy; Eye surgery; Cardiac catheterization; Xi robotic vagotomy and antrectomy (N/A, 09/25/2015); Esophagogastroduodenoscopy (egd) with propofol (N/A, 11/23/2015); Laparoscopic gastrostomy (N/A, 12/01/2015); and Esophagogastroduodenoscopy (N/A, 12/01/2015).  Allergies  Allergen Reactions  . Flomax [Tamsulosin Hcl] Other (See Comments)    Leg weakness  . Lisinopril Cough  . Lorazepam     "i don't like it"  Prefers Xanax or valium  . Uroxatral [Alfuzosin Hcl Er] Other (See Comments)    Leg weakness    No current facility-administered  medications on file prior to encounter.    Current Outpatient Prescriptions on File Prior to Encounter  Medication Sig  . ALPRAZolam (XANAX) 0.5 MG  tablet Take 0.5 mg by mouth at bedtime as needed for sleep or anxiety.  [name] aspirin EC 81 MG tablet Take 81 mg by mouth every evening.   [name] atorvastatin (LIPITOR) 40 MG tablet Take 40 mg by mouth at bedtime.   Tery Sanfilippo Calcium (STOOL SOFTENER PO) Take 1 capsule by mouth daily as needed (for constipation).   . Multiple Vitamin (MULTIVITAMIN) tablet Take 2 tablets by mouth daily with breakfast.   . nitroGLYCERIN (NITROSTAT) 0.4 MG SL tablet Place 0.4 mg under the tongue every 5 (five) minutes as needed for chest pain.  . pantoprazole (PROTONIX) 40 MG tablet Take 40 mg by mouth 2 (two) times daily.  . Polyvinyl Alcohol-Povidone (REFRESH OP) Place 2 drops into both eyes as needed (For dry eyes.).   [name] RESTASIS 0.05 % ophthalmic emulsion Place 1 drop into both eyes 2 (two) times daily.   . valsartan (DIOVAN) 80 MG tablet Take 80 mg by mouth daily with breakfast.   . metoCLOPramide (REGLAN) 5 MG tablet Take 1-2 tablets (5-10 mg total) by mouth every 6 (six) hours as needed for nausea or vomiting. (Patient not taking: Reported on 11/24/2015)  . traMADol (ULTRAM) 50 MG tablet Take 1-2 tablets (50-100 mg total) by mouth every 6 (six) hours as needed for moderate pain or severe pain. (Patient not taking: Reported on 11/24/2015)    FAMILY HISTORY:  His indicated that his mother is deceased. He indicated that his father is deceased.    SOCIAL HISTORY: He  reports that he quit smoking about 20 years ago. His smoking use included Cigarettes. He has a 40.00 pack-year smoking history. He has never used smokeless tobacco. He reports that he does not drink alcohol or use drugs.  REVIEW OF SYSTEMS:   Cannot obtain due to intubation  SUBJECTIVE:  As above  VITAL SIGNS: BP (!) 142/54 (BP Location: Right Arm)   Pulse 93   Temp 98.9 F (37.2 C) (Oral)   Resp  20   Ht 6' (1.829 m)   Wt 82.1 kg (180 lb 14.4 oz)   SpO2 96%   BMI 24.53 kg/m   HEMODYNAMICS:    VENTILATOR SETTINGS:    INTAKE / OUTPUT: I/O last 3 completed shifts: In: 1422.6 [P.O.:360; I.V.:812.6; Other:90; NG/GT:160] Out: 1600 [Urine:1200; Drains:400]  PHYSICAL EXAMINATION: General:  Sedated, paralyzed on vent Neuro:  Sedated, paralzyed on vent HEENT:  NCAT ETT in place Cardiovascular:  RRR, no mgr Lungs:  CTA B, vent supported breaths Abdomen:  Distended, three drains in place with G tube and feeding J tube Musculoskeletal:  Normal bulk, no bony deformity Skin:  No rash or skin breakdown  LABS:  BMET  Recent Labs Lab 11/29/15 0445 11/30/15 0525 12/01/15 0509 12/03/15 0512  NA 135 136  --  134*  K 3.7 4.1  --  4.3  CL 106 107  --  104  CO2 26 23  --  20*  BUN 18 21*  --  38*  CREATININE 0.62 0.61 0.70 1.22  GLUCOSE 116* 108*  --  150*    Electrolytes  Recent Labs Lab 11/28/15 0443 11/29/15 0445 11/30/15 0525 12/03/15 0512  CALCIUM 8.3* 8.2* 8.4* 8.7*  MG 2.0  --  1.9 1.8  PHOS 3.8  --  3.7 2.6    CBC  Recent Labs Lab 11/27/15 0435 11/30/15 0525 12/03/15 0512  WBC 7.4 6.7 12.5*  HGB 11.7* 11.2* 17.0  HCT 34.2* 32.8* 50.6  PLT 321 253 349    Coag's  No results for input(s): APTT, INR in the last 168 hours.  Sepsis Markers No results for input(s): LATICACIDVEN, PROCALCITON, O2SATVEN in the last 168 hours.  ABG No results for input(s): PHART, PCO2ART, PO2ART in the last 168 hours.  Liver Enzymes  Recent Labs Lab 11/29/15 0445 11/30/15 0525 12/03/15 0512  AST 210* 86* 27  ALT 384* 236* 62  ALKPHOS 142* 121 78  BILITOT 0.6 0.6 0.6  ALBUMIN 2.9* 2.9* 2.6*    Cardiac Enzymes No results for input(s): TROPONINI, PROBNP in the last 168 hours.  Glucose  Recent Labs Lab 12/02/15 1756 12/02/15 2010 12/03/15 0000 12/03/15 0322 12/03/15 0559 12/03/15 1234  GLUCAP 189* 246* 193* 151* 157* 153*    Imaging No results  found.   STUDIES:  10/11 CT abdomen/pelvis> significant distension of the stomach with layering fluid and gas, NG present, worrisome for recurrent gastric outlet obstruction.  RLL consolidation, cholecycstectomy  CULTURES: 10/19 blood >   ANTIBIOTICS: 10/19 zosyn 10/18 erythromycin 10/17 cefazolin, metronidazole x1 dose  SIGNIFICANT EVENTS: 10/10 admission 10/17 Laparoscopic placement of J and G tubes, EGD with balloon dilation of duodenal stricture, gastric biopsies, closure of gastrotomy 10/18 sundowning, pulled out G tube 10/19 laparaoscopy (findings gastric leak, bilious ascites, jejunal disruption), omentoplexy of jejunal disruption, gastric leak  LINES/TUBES: 10/19 ETT >  10/19 CVL left subclavian >  10/19 R brachial arterial line >  10/19 abdominal drain x3 10/19 G tube 10/19 J tube  DISCUSSION: 75 y/o male with a lengthy history of peptic ulcer disease who had a vagotomy and bilroth in [place] now admitted with gastric obstruction due to gastritis and recurrent ulcers. He had a G and J tube placed on 10/17 but in the midst of apparent sundowning on 10/18 he pulled out the G tube and now has a gastric leak, jejunal disruption and peritonitis.  He is s/p laparoscopic repair of the jejunal disruption on 10/19 and had several drains placed in addition to replacement of G and J tubes.  He returns to the ICU intubated.   ASSESSMENT / PLAN:  PULMONARY A: Inability to protect airway in post-operative setting.  P:   Full vent support Increase RR to 20 SBT 10/20 AM VAP prevention protocol CXR  CARDIOVASCULAR A:  Hypertension, likely sedation/pain related P:  Treat with sedation now Tele If BP still elevated post sedation, consider prn hydralazine  RENAL A:   At risk for AKI given peritonitis, rising Cr P:   Aggressive IVF per surgery Monitor BMET and UOP Replace electrolytes as needed   GASTROINTESTINAL A:   Gastric ulcers Gastric outlet obstruction Gastric  leak post displacement of G-tube Jejunal disruption (pinpoint) s/p omentoplexy Peritonitis P:   No enteral feedings May need TPN, per surgery Monitor abdominal drain output G/J management per surgery Continue famotidine, but given clinical scenario he would do better with IV PPI, will contact pharmacy given current restrictions  HEMATOLOGIC A:   No acute issues P:  Monitor for bleeding  INFECTIOUS A:   Peritonitis secondary to gastric leak Gastric outlet obstruction s/p EGD dilation  P:   Zosyn F/u cultures  ENDOCRINE A:   Mild hyperglycemia   P:   SSI, target 140-180, change frequency to q4h  NEUROLOGIC A:   Sedation needs for vent syncrhony Acute encephalopathy/Delirium prior to surgery P:   RASS goal: -1 precedex gtt Prn fentanyl and versed   FAMILY  - Updates: none bedside  - Inter-disciplinary family meet or Palliative Care meeting due by:  day 7  My cc time 38 minutes  [employer], MD  PCCM Pager: [phone] Cell: [phone] After 3pm or if no response, call [phone]    12/03/2015, 10:38 PM

## 2015-12-03 NOTE — Anesthesia Preprocedure Evaluation (Addendum)
Anesthesia Evaluation  Patient identified by MRN, date of birth, ID band Patient awake    Reviewed: Allergy & Precautions, NPO status , Patient's Chart, lab work & pertinent test results  Airway Mallampati: III  TM Distance: <3 FB Neck ROM: Full  Mouth opening: Limited Mouth Opening  Dental no notable dental hx.    Pulmonary neg pulmonary ROS, former smoker,    Pulmonary exam normal breath sounds clear to auscultation       Cardiovascular hypertension, + CAD and + Cardiac Stents (1997)  Normal cardiovascular exam Rhythm:Regular Rate:Normal     Neuro/Psych negative neurological ROS  negative psych ROS   GI/Hepatic Neg liver ROS, PUD, GERD  ,  Endo/Other  negative endocrine ROS  Renal/GU negative Renal ROS  negative genitourinary   Musculoskeletal negative musculoskeletal ROS (+)   Abdominal   Peds negative pediatric ROS (+)  Hematology negative hematology ROS (+)   Anesthesia Other Findings   Reproductive/Obstetrics negative OB ROS                             Anesthesia Physical  Anesthesia Plan  ASA: III and emergent  Anesthesia Plan: General   Post-op Pain Management:    Induction: Intravenous and Rapid sequence  Airway Management Planned: Oral ETT and Video Laryngoscope Planned  Additional Equipment:   Intra-op Plan:   Post-operative Plan: Post-operative intubation/ventilation  Informed Consent: I have reviewed the patients History and Physical, chart, labs and discussed the procedure including the risks, benefits and alternatives for the proposed anesthesia with the patient or authorized representative who has indicated his/her understanding and acceptance.   Dental advisory given  Plan Discussed with: CRNA and Surgeon  Anesthesia Plan Comments:         Anesthesia Quick Evaluation

## 2015-12-03 NOTE — Progress Notes (Signed)
[name]  11/25/1940 [id number]  Patient Care Team: [name], MD as PCP - General (Internal Medicine) Karie Soda, MD as Consulting Physician (General Surgery) Willis Modena, MD as Consulting Physician (Gastroenterology)  This patient is a 75 y.o.male   Patient with worsening drainage around his gastrostomy tube through the day.  Placed to suction.  Less spillage.  Some right lower quadrant abdominal pain at port sites.  Feeling tired though.  Urine output less.  Transfering to stepdown unit for observation.  OCame to site his gastrostomy tube.  28 French tube easily passed the stomach with aspiration of thin gastric contents.  However his respiratory rate is up.  His abdominal pain is more intense this evening.   Concern for peritoneal signs.  More distention.  I think he requires abdominal exploration to rule out any spillage of gastric contents or other issues.  Washout.  Make sure the gastrostomy tube is in good position.  Make sure there is no problems with the jejunostomy tube.  We will try laparoscopically versus converting to open.  Spillage, washout and drains.  Retry low-dose tube feeds.  Intensive care unit postop.    The anatomy & physiology of the digestive tract was discussed.  The pathophysiology of perforation was discussed.  Differential diagnosis such as perforated ulcer or colon, etc was discussed.   Natural history risks without surgery such as death was discussed.  I recommended abdominal exploration to diagnose & treat the source of the problem.  Laparoscopic & open techniques were discussed.   Risks such as bleeding, infection, abscess, leak, reoperation, bowel resection, possible ostomy, injury to other organs, need for repair of tissues / organs, hernia, heart attack, death, and other risks were discussed.   The risks of no intervention will lead to serious problems including death.   I expressed a good likelihood that surgery will address the problem.     Goals of post-operative recovery were discussed as well.  We will work to minimize complications although risks in an emergent setting are high.   Questions were answered.   patient is mildly confused but expressed understanding.  His wife and brother-in-law expressed understanding & wishes to proceed with surgery.   Nurses at bedside.         Patient Active Problem List   Diagnosis Date Noted  . Acute confusional state 12/03/2015  . Gastric Ileus  11/25/2015  . Protein-calorie malnutrition, moderate (HCC) 11/25/2015  . Gastric outlet obstruction 11/24/2015  . Anxiety state 09/28/2015  . Hyperlipidemia   . 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 Coronary atherosclerosis of native coronary artery 04/24/2013  . Essential hypertension, benign 04/24/2013  . Other and unspecified hyperlipidemia 04/24/2013  . Central pterygium 10/30/2012  . 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 Cataract, nuclear sclerotic senile 10/30/2012    Past Medical History:  Diagnosis Date  . Bilateral dry eyes   . Coronary artery disease 1997   bare mental stent right coronart artery  . Degenerative disc disease    LOWER BACK  . Dysphonia   . ED (erectile dysfunction)   . Gastroesophageal reflux disease   . Hyperlipidemia   . Hypertension   . Peptic ulcer   . Pneumonia    HISTORY OF IN CHILDHOOD  . Posterior vitreous detachment 10/30/2012  . Urinary hesitancy   . Wears glasses     Past Surgical History:  Procedure Laterality Date  . CARDIAC CATHETERIZATION    .  CHOLECYSTECTOMY    . ESOPHAGOGASTRODUODENOSCOPY N/A 12/01/2015   Procedure: ESOPHAGOGASTRODUODENOSCOPY (EGD) BALLOON DILATION OF DUODENAL STRICTURE;  Surgeon: Karie Soda, MD;  Location: [facility];  Service: General;  Laterality: N/A;  . ESOPHAGOGASTRODUODENOSCOPY (EGD) WITH PROPOFOL N/A 07/01/2015   Procedure: ESOPHAGOGASTRODUODENOSCOPY (EGD) WITH PROPOFOL;   Surgeon: Willis Modena, MD;  Location: [facility]COPY;  Service: Endoscopy;  Laterality: N/A;  . ESOPHAGOGASTRODUODENOSCOPY (EGD) WITH PROPOFOL N/A 11/23/2015   Procedure: ESOPHAGOGASTRODUODENOSCOPY (EGD) WITH PROPOFOL;  Surgeon: Willis Modena, MD;  Location: [facility] ENDOSCOPY;  Service: Endoscopy;  Laterality: N/A;  may need to intubate  . EYE SURGERY     growth on right eye removed   . LAPAROSCOPIC GASTROSTOMY N/A 12/01/2015   Procedure: LAPAROSCOPIC PLACEMENT OF FEEDING JEJUNOSTOMY AND GASTROSTOMY TUBE;  Surgeon: Karie Soda, MD;  Location: [facility];  Service: General;  Laterality: N/A;  . STENT TO HEART     2 1997 AND 1 IN 1996  . TONSILLECTOMY    . XI ROBOTIC VAGOTOMY AND ANTRECTOMY N/A 09/25/2015   Procedure: XI ROBOTIC ANTERIOR AND POSTERIOR VAGOTOMY, BILROTH I  ANASTOMOSIS DOR FUNDIPLICATION, OMENTOPEXY  UPPER ENDOSCOPY;  Surgeon: Karie Soda, MD;  Location: [facility];  Service: General;  Laterality: N/A;    Social History   Social History  . Marital status: Married    Spouse name: N/A  . Number of children: N/A  . Years of education: N/A   Occupational History  . Not on file.   Social History Main Topics  . Smoking status: Former Smoker    Packs/day: 2.00    Years: 20.00    Types: Cigarettes    Quit date: 02/15/1995  . Smokeless tobacco: Never Used  . Alcohol use No  . Drug use: No  . Sexual activity: Not on file   Other Topics Concern  . Not on file   Social History Narrative  . No narrative on file    Family History  Problem Relation Age of Onset  . Heart attack Mother     Current Facility-Administered Medications  Medication Dose Route Frequency Provider Last Rate Last Dose  . acetaminophen (TYLENOL) solution 500 mg  500 mg Per Tube QID Karie Soda, MD   500 mg at 12/03/15 1751  . bisacodyl (DULCOLAX) suppository 10 mg  10 mg Rectal Daily Karie Soda, MD   10 mg at 12/03/15 1021  . [START ON 12/04/2015] cefoTEtan (CEFOTAN) 2 g in dextrose 5 % 50 mL IVPB  2 g  Intravenous On Call to OR Karie Soda, MD      . chlorhexidine (PERIDEX) 0.12 % solution 15 mL  15 mL Mouth Rinse BID Karie Soda, MD   15 mL at 12/03/15 1022  . cycloSPORINE (RESTASIS) 0.05 % ophthalmic emulsion 1 drop  1 drop Both Eyes BID Karie Soda, MD   1 drop at 12/03/15 1023  . dextrose 10 % infusion   Intravenous Continuous Karie Soda, MD 50 mL/hr at 12/03/15 1431    . diazepam (VALIUM) 1 MG/ML solution 1 mg  1 mg Per Tube QHS,MR X 1 Karie Soda, MD      . diazepam (VALIUM) 1 MG/ML solution 5 mg  5 mg Per Tube Q8H PRN Karie Soda, MD      . diazepam (VALIUM) injection 2.5-10 mg  2.5-10 mg Intravenous Q8H PRN Karie Soda, MD      . diphenhydrAMINE (BENADRYL) injection 12.5 mg  12.5 mg Intravenous Q6H PRN Edson Snowball, PA-C   12.5 mg at 12/02/15 0300  .  enoxaparin (LOVENOX) injection 40 mg  40 mg Subcutaneous Q24H Edson Snowball, PA-C   40 mg at 12/02/15 2314  . famotidine (PEPCID) IVPB 20 mg premix  20 mg Intravenous Q12H Darnell Level, MD   20 mg at 12/03/15 1022  . fentaNYL (SUBLIMAZE) injection 25-50 mcg  25-50 mcg Intravenous Q1H PRN Karie Soda, MD      . hydrALAZINE (APRESOLINE) injection 5-20 mg  5-20 mg Intravenous Q6H PRN Karie Soda, MD      . HYDROcodone-acetaminophen (HYCET) 7.5-325 mg/15 ml solution 10 mL  10 mL Per Tube Q4H PRN Karie Soda, MD      . insulin aspart (novoLOG) injection 0-9 Units  0-9 Units Subcutaneous Q6H Karie Soda, MD   2 Units at 12/03/15 1311  . iopamidol (ISOVUE-300) 61 % injection 15 mL  15 mL Oral Once PRN Karie Soda, MD   15 mL at 11/25/15 1218  . lactated ringers bolus 1,000 mL  1,000 mL Intravenous Q8H PRN Karie Soda, MD      . lactated ringers bolus 1,000 mL  1,000 mL Intravenous Once Karie Soda, MD      . lactated ringers infusion   Intravenous Continuous Karie Soda, MD      . MEDLINE mouth rinse  15 mL Mouth Rinse q12n4p Karie Soda, MD   15 mL at 12/03/15 1313  . methocarbamol (ROBAXIN) 500 mg in dextrose 5 % 50 mL IVPB   500 mg Intravenous Q6H PRN Karie Soda, MD   500 mg at 12/03/15 1754  . methocarbamol (ROBAXIN) tablet 500 mg  500 mg Oral Q6H PRN Karie Soda, MD      . metoCLOPramide (REGLAN) 5 MG/5ML solution 5 mg  5 mg Per Tube TID Karie Soda, MD   5 mg at 12/03/15 1753  . metoprolol (LOPRESSOR) injection 5 mg  5 mg Intravenous Q6H PRN Karie Soda, MD   5 mg at 12/02/15 2313  . nitroGLYCERIN (NITROSTAT) SL tablet 0.4 mg  0.4 mg Sublingual Q5 min PRN Karie Soda, MD      . ondansetron [facility]) injection 4 mg  4 mg Intravenous Q6H PRN Edson Snowball, PA-C   4 mg at 12/01/15 2001  . pantoprazole sodium (PROTONIX) 40 mg/20 mL oral suspension 40 mg  40 mg Per Tube BID Karie Soda, MD   40 mg at 12/03/15 1022  . phenol (CHLORASEPTIC) mouth spray 1 spray  1 spray Mouth/Throat PRN Karie Soda, MD      . polyvinyl alcohol (LIQUIFILM TEARS) 1.4 % ophthalmic solution 2 drop  2 drop Both Eyes BID Karie Soda, MD   2 drop at 12/03/15 1023  . prochlorperazine (COMPAZINE) injection 5-10 mg  5-10 mg Intravenous Q4H PRN Karie Soda, MD      . sodium chloride flush (NS) 0.9 % injection 10-40 mL  10-40 mL Intracatheter PRN Willis Modena, MD   10 mL at 12/01/15 0510     Allergies  Allergen Reactions  . Flomax [Tamsulosin Hcl] Other (See Comments)    Leg weakness  . Lisinopril Cough  . Lorazepam     "i don't like it"  Prefers Xanax or valium  . Uroxatral [Alfuzosin Hcl Er] Other (See Comments)    Leg weakness    BP (!) 142/54 (BP Location: Right Arm)   Pulse 93   Temp 98.9 F (37.2 C) (Oral)   Resp 20   Ht 6' (1.829 m)   Wt 82.1 kg (180 lb 14.4 oz)   SpO2 96%  BMI 24.53 kg/m   Dg Abd 1 View  Result Date: 12/02/2015 CLINICAL DATA:  75 year old male with gastrostomy tube removal and placement of a Foley catheter through the gastrostomy. EXAM: ABDOMEN - 1 VIEW COMPARISON:  Abdominal radiograph dated 11/29/2015 and CT dated 11/25/2015 FINDINGS: A percutaneous catheter is noted in the left upper  abdomen. 25 cc of Gastrografin was administered through the catheter which appears to opacified the stomach. The tip of the catheter and the balloon noted within the stomach. Small amount of contrast noted extending along the tube and over the patient's skin. There has been interval resolution of the previously seen gastric distention. There is no bowel dilatation or evidence of obstruction. No free air identified. Right upper quadrant cholecystectomy clips noted. The soft tissues and osseous structures are unremarkable. IMPRESSION: Percutaneously placed Foley catheter through the gastrostomy with tip and balloon appear in the stomach. Interval resolution of the previously seen air distended stomach. Electronically Signed   By: Elgie Collard M.D.   On: 12/02/2015 22:49   Ct Abdomen Pelvis W Contrast  Result Date: 11/25/2015 CLINICAL DATA:  Partial gastrectomy 2 months ago. 50 pound weight loss. History of gastric outlet obstruction. Rule out leak or abscess. EXAM: CT ABDOMEN AND PELVIS WITH CONTRAST TECHNIQUE: Multidetector CT imaging of the abdomen and pelvis was performed using the standard protocol following bolus administration of intravenous contrast. CONTRAST:  ISOVUE-300 IOPAMIDOL (ISOVUE-300) INJECTION 61% COMPARISON:  None. FINDINGS: Lower chest: Patchy opacity posteriorly in the right lower lung, new since prior study. Cannot exclude area of early infiltrate. Left lung base is clear. Heart is normal size. Hepatobiliary: Prior cholecystectomy.  No focal hepatic abnormality. Pancreas: No focal abnormality or ductal dilatation. Spleen: No focal abnormality.  Normal size. Adrenals/Urinary Tract: No adrenal abnormality. No focal renal abnormality. No stones or hydronephrosis. Urinary bladder is unremarkable. Stomach/Bowel: There is distention of the stomach with layering fluid and gas. NG tube tip is in the proximal to mid stomach. Postsurgical changes in the region of the distal stomach. Small  bowel is decompressed. Scattered left colonic diverticula. No active diverticulitis. Vascular/Lymphatic: Diffuse aortoiliac atherosclerosis with calcifications. No aneurysm. No adenopathy. Reproductive: No visible focal abnormality. Other: No free fluid or free air. No focal fluid collection to suggest abscess. Musculoskeletal: No acute bony abnormality or focal bone lesion. IMPRESSION: Significant distention of the stomach with layering fluid and gas. NG tube is present in the proximal to mid stomach. Appearance concerning for Recurrent gastric outlet obstruction. Patchy new opacity noted posteriorly and right lower lobe. Cannot exclude early pneumonia. Prior cholecystectomy. Scattered left colonic diverticula.  No active diverticulitis. Aortoiliac atherosclerosis. These results will be called to the ordering clinician or representative by the Radiologist Assistant, and communication documented in the PACS or zVision Dashboard. Electronically Signed   By: Charlett Nose M.D.   On: 11/25/2015 16:05   Ct Abdomen Pelvis W Contrast  Result Date: 11/09/2015 CLINICAL DATA:  75 year old male with mid abdominal pain and bloating for 3 weeks. 50 pound weight loss over the past year. Prior cholecystectomy. Prior partial gastrectomy and removal of small bowel for ulcers. Hypertension. Hyperlipidemia. Subsequent encounter. EXAM: CT ABDOMEN AND PELVIS WITH CONTRAST TECHNIQUE: Multidetector CT imaging of the abdomen and pelvis was performed using the standard protocol following bolus administration of intravenous contrast. CONTRAST:  ISOVUE-300 IOPAMIDOL (ISOVUE-300) INJECTION 61% COMPARISON:  07/14/2015 upper GI series. 12/17/2014 CT abdomen and pelvis. FINDINGS: Lower chest: No worrisome lung base abnormality. Coronary artery calcifications. Hepatobiliary: Post cholecystectomy. No  focal hepatic lesion. Mild fatty infiltration of the liver. Pancreas: No pancreatic mass or inflammation. Flattening of the pancreas by  enlarged stomach. Spleen: No mass or enlargement. Adrenals/Urinary Tract: No adrenal or renal mass. No renal or ureteral obstructing stone. Stomach/Bowel: Markedly enlarged secretion filled stomach. Irregular narrowed gastric duodenal anastomosis with surrounding inflammation has progressed since prior exam. Findings may reflect changes of recurrent ulcer disease causing gastric outlet obstruction. Underlying mass would be difficult to exclude in this setting although not identified on the current exam. Scattered colonic diverticula most notable on the left without surrounding inflammation. No free intraperitoneal air or focal drainable fluid collection. Vascular/Lymphatic: Grouping of small lymph nodes gastrohepatic, porta hepatis and peripancreatic region appear slightly larger than on the prior CT, largest measuring up to 9 mm. Retrocrural lymph node appears slightly larger measuring 12.7 x 7.1 mm versus prior 9.1 x 6 mm. Atherosclerotic changes abdominal aorta with ectasia. Infrarenal bulge measuring up to 2.8 cm without change. Narrowing origin celiac artery and superior mesenteric artery. Narrowing iliac and femoral arteries with most notable narrowing proximal right common iliac artery with high-grade focal stenosis. Reproductive: Negative. Other: Nonspecific subcutaneous process bilaterally may be related to subcutaneous injections. Musculoskeletal: Mild scoliosis lumbar spine convex left. Disc space narrowing most notable L2-3. Facet degenerative changes greatest on the left at the L5-S1 level there IMPRESSION: Markedly enlarged secretion filled stomach. Irregular narrowed gastric duodenal anastomosis with surrounding inflammation has progressed since prior exam. Findings may reflect changes of recurrent ulcer disease causing gastric outlet obstruction. Underlying mass would be difficult to exclude in this setting although not identified on the current exam. Small lymph nodes upper abdomen appear minimally  more prominent than on the prior exam. Ectatic abdominal aorta are measuring up to 2.8 cm without significant change. Ectatic abdominal aorta at risk for aneurysm development. Recommend followup by ultrasound in 5 years. This recommendation follows ACR consensus guidelines: White Paper of the ACR Incidental Findings Committee II on Vascular Findings. J Am Coll Radiol 2013; 10:789-794. Narrowing iliac arteries and femoral arteries with most notable narrowing proximal right common iliac artery with high-grade focal stenosis. These results will be called to the ordering clinician or representative by the Radiologist Assistant, and communication documented in the PACS or zVision Dashboard. Electronically Signed   By: Lacy Duverney M.D.   On: 11/09/2015 09:52   Dg Abd Acute W/chest  Result Date: 11/29/2015 CLINICAL DATA:  Abdominal distension. EXAM: DG ABDOMEN ACUTE W/ 1V CHEST COMPARISON:  CT scan November 25, 2015 FINDINGS: A right PICC line terminates in the central SVC. No pneumothorax. The heart, hila, mediastinum, lungs, and pleura are normal. No free air, portal venous gas, or pneumatosis. The NG tube terminates in the gastric antrum. The stomach is distended with air and fluid. Previous cholecystectomy. No small bowel or colonic dilatation to suggest obstruction. No other acute abnormalities. IMPRESSION: 1. The stomach remains distended with air and fluid despite the NG tube. Electronically Signed   By: Gerome Sam III M.D   On: 11/29/2015 10:24   Dg Kayleen Memos  W/kub  Result Date: 11/12/2015 CLINICAL DATA:  History of gastric outlet obstruction with distal gastrectomy and Billroth 1 anastomosis by surgical history. EXAM: WATER SOLUBLE UPPER GI SERIES TECHNIQUE: Single-column upper GI series was performed using water soluble contrast. CONTRAST:  Water-soluble contrast COMPARISON:  CT abdomen pelvis of 11/09/2015 FLUOROSCOPY TIME:  Fluoroscopy Time:  54 seconds Radiation Exposure Index (if provided by the  fluoroscopic device): 67 deciGy per square cm Number  of Acquired Spot Images: 0 FINDINGS: A preliminary KUB shows massive distention of the stomach which appears to be fluid filled. Surgical clips are noted in the right upper quadrant. The remainder of the distal bowel is unremarkable. Initially rapid sequence spot films of the cervical esophagus were performed in the frontal projection. The swallowing mechanism is unremarkable. Again in the erect position there is a large amount of fluid distending the stomach. Esophageal peristalsis is unremarkable. However there is so much fluid in the stomach that the water-soluble contrast is considerably diluted, resulting in poor definition of anatomy. Therefore, I contacted Dr. Kirby FunkJohn Griffin and discussed the findings. He will contact the gastroenterologist and consider endoscopy to assess further. IMPRESSION: There is considerable fluid distention of the stomach consistent with persistent gastric outlet obstruction. This study is very limited as described above due to dilution of contrast by the large amount of fluid remaining in the stomach. Dr. Valentina LucksGriffin will contact the gastroenterologist and consider endoscopy to evaluate further. Electronically Signed   By: Dwyane DeePaul  Barry M.D.   On: 11/12/2015 09:37    Note: This dictation was prepared with Dragon/digital dictation along with [employer] technology. Any transcriptional errors that result from this process are unintentional.

## 2015-12-03 NOTE — Progress Notes (Signed)
Pharmacy Antibiotic Follow-up Note  Ardyth GalRalph T Plouffe is a 75 y.o. year-old male admitted on 11/24/2015.  The patient is currently on day 1 of Zosyn for intra-abdominal infection.  Assessment/Plan: Zosyn 3.375g IV q8h (4 hour infusion).  Temp (24hrs), Avg:98.4 F (36.9 C), Min:97.6 F (36.4 C), Max:98.9 F (37.2 C)   Recent Labs Lab 11/27/15 0435 11/30/15 0525 12/03/15 0512  WBC 7.4 6.7 12.5*    Recent Labs Lab 11/28/15 0443 11/29/15 0445 11/30/15 0525 12/01/15 0509 12/03/15 0512  CREATININE 0.72 0.62 0.61 0.70 1.22   Estimated Creatinine Clearance: 57.4 mL/min (by C-G formula based on SCr of 1.22 mg/dL).    Allergies  Allergen Reactions  . Flomax [Tamsulosin Hcl] Other (See Comments)    Leg weakness  . Lisinopril Cough  . Lorazepam     "i don't like it"  Prefers Xanax or valium  . Uroxatral [Alfuzosin Hcl Er] Other (See Comments)    Leg weakness    Antimicrobials this admission: 10/20 Zosyn >>  10/19 Cefotetan x1  Levels/dose changes this admission:  Microbiology results: None ordered  Thank you for allowing pharmacy to be a part of this patient's care.  Otho BellowsGreen, Raymonda Pell L PharmD 12/03/2015 10:26 PM

## 2015-12-03 NOTE — Anesthesia Procedure Notes (Signed)
Procedure Name: Intubation Date/Time: 12/03/2015 7:57 PM Performed by: Illene SilverEVANS, Grae Cannata E Pre-anesthesia Checklist: Patient identified, Emergency Drugs available, Suction available and Patient being monitored Patient Re-evaluated:Patient Re-evaluated prior to inductionOxygen Delivery Method: Circle system utilized Preoxygenation: Pre-oxygenation with 100% oxygen Intubation Type: IV induction Laryngoscope Size: Glidescope and 4 Grade View: Grade II Tube type: Oral Tube size: 8.0 (taper tube to ventilate overnight) mm Number of attempts: 1 Airway Equipment and Method: Stylet and Oral airway Placement Confirmation: ETT inserted through vocal cords under direct vision,  positive ETCO2 and breath sounds checked- equal and bilateral Secured at: 22 cm Tube secured with: Tape Dental Injury: Teeth and Oropharynx as per pre-operative assessment  Difficulty Due To: Difficulty was anticipated, Difficult Airway- due to anterior larynx, Difficult Airway- due to limited oral opening and Difficult Airway- due to dentition Comments: Elective glidescope  MAP 3 and use of glidescope 2 days ago.easily seen with glicescope

## 2015-12-03 NOTE — Progress Notes (Signed)
Nurse in to patient room after staff indicated dressing to abdomen saturated.  Dressing in place removed and skin cleansed.  Barrier dressing (petroleum) placed on skin the project from gastric drainage.  Dressings replaced.  Tube secured to patient dressing.  Sitter and wife at bedside.  Yellow/brown drainage noted in drainage container at bedside.

## 2015-12-03 NOTE — Evaluation (Signed)
SLP Cancellation Note  Patient Details Name: [name] MRN: [id number] DOB: 03-Oct-1940   Cancelled treatment:       Reason Eval/Treat Not Completed: Other (comment) (order for "SLP" received but service declaration not written, Please reorder SLP if desire and clarify service - cogling, swallow.  Spoke to [name]   Thanks)  [name], MS [facility] SLP [phone]

## 2015-12-04 ENCOUNTER — Encounter (HOSPITAL_COMMUNITY): Payer: Self-pay | Admitting: Surgery

## 2015-12-04 ENCOUNTER — Inpatient Hospital Stay (HOSPITAL_COMMUNITY): Payer: Medicare Other

## 2015-12-04 DIAGNOSIS — A419 Sepsis, unspecified organism: Secondary | ICD-10-CM

## 2015-12-04 DIAGNOSIS — J9601 Acute respiratory failure with hypoxia: Secondary | ICD-10-CM

## 2015-12-04 DIAGNOSIS — M79609 Pain in unspecified limb: Secondary | ICD-10-CM

## 2015-12-04 DIAGNOSIS — R6521 Severe sepsis with septic shock: Secondary | ICD-10-CM

## 2015-12-04 DIAGNOSIS — E43 Unspecified severe protein-calorie malnutrition: Secondary | ICD-10-CM | POA: Insufficient documentation

## 2015-12-04 DIAGNOSIS — I82621 Acute embolism and thrombosis of deep veins of right upper extremity: Secondary | ICD-10-CM

## 2015-12-04 DIAGNOSIS — I998 Other disorder of circulatory system: Secondary | ICD-10-CM

## 2015-12-04 LAB — HEPATIC FUNCTION PANEL
ALBUMIN: 1.8 g/dL — AB (ref 3.5–5.0)
ALK PHOS: 51 U/L (ref 38–126)
ALT: 30 U/L (ref 17–63)
AST: 35 U/L (ref 15–41)
BILIRUBIN TOTAL: 0.7 mg/dL (ref 0.3–1.2)
Bilirubin, Direct: 0.3 mg/dL (ref 0.1–0.5)
Indirect Bilirubin: 0.4 mg/dL (ref 0.3–0.9)
Total Protein: 4 g/dL — ABNORMAL LOW (ref 6.5–8.1)

## 2015-12-04 LAB — GLUCOSE, CAPILLARY
GLUCOSE-CAPILLARY: 103 mg/dL — AB (ref 65–99)
GLUCOSE-CAPILLARY: 75 mg/dL (ref 65–99)
GLUCOSE-CAPILLARY: 88 mg/dL (ref 65–99)
GLUCOSE-CAPILLARY: 96 mg/dL (ref 65–99)
Glucose-Capillary: 102 mg/dL — ABNORMAL HIGH (ref 65–99)
Glucose-Capillary: 74 mg/dL (ref 65–99)
Glucose-Capillary: 99 mg/dL (ref 65–99)

## 2015-12-04 LAB — MAGNESIUM: Magnesium: 1.5 mg/dL — ABNORMAL LOW (ref 1.7–2.4)

## 2015-12-04 LAB — BASIC METABOLIC PANEL
Anion gap: 6 (ref 5–15)
BUN: 45 mg/dL — AB (ref 6–20)
CHLORIDE: 104 mmol/L (ref 101–111)
CO2: 19 mmol/L — ABNORMAL LOW (ref 22–32)
CREATININE: 1.85 mg/dL — AB (ref 0.61–1.24)
Calcium: 7.1 mg/dL — ABNORMAL LOW (ref 8.9–10.3)
GFR calc Af Amer: 39 mL/min — ABNORMAL LOW (ref >60)
GFR calc non Af Amer: 34 mL/min — ABNORMAL LOW (ref >60)
Glucose, Bld: 103 mg/dL — ABNORMAL HIGH (ref 65–99)
Potassium: 6 mmol/L — ABNORMAL HIGH (ref 3.5–5.1)
SODIUM: 129 mmol/L — AB (ref 135–145)

## 2015-12-04 LAB — CBC
HCT: 35.9 % — ABNORMAL LOW (ref 39.0–52.0)
HEMOGLOBIN: 12.4 g/dL — AB (ref 13.0–17.0)
MCH: 31.2 pg (ref 26.0–34.0)
MCHC: 34.5 g/dL (ref 30.0–36.0)
MCV: 90.2 fL (ref 78.0–100.0)
Platelets: 291 10*3/uL (ref 150–400)
RBC: 3.98 MIL/uL — ABNORMAL LOW (ref 4.22–5.81)
RDW: 13.3 % (ref 11.5–15.5)
WBC: 16.3 10*3/uL — ABNORMAL HIGH (ref 4.0–10.5)

## 2015-12-04 LAB — BLOOD GAS, ARTERIAL
ACID-BASE DEFICIT: 4.7 mmol/L — AB (ref 0.0–2.0)
BICARBONATE: 18.2 mmol/L — AB (ref 20.0–28.0)
Drawn by: 406621
FIO2: 40
MECHVT: 620 mL
O2 SAT: 96.7 %
PATIENT TEMPERATURE: 37
PCO2 ART: 28.4 mmHg — AB (ref 32.0–48.0)
PEEP/CPAP: 5 cmH2O
PH ART: 7.423 (ref 7.350–7.450)
PO2 ART: 80.8 mmHg — AB (ref 83.0–108.0)
RATE: 20 resp/min

## 2015-12-04 LAB — LIPASE, BLOOD: LIPASE: 10 U/L — AB (ref 11–51)

## 2015-12-04 LAB — APTT: APTT: 39 s — AB (ref 24–36)

## 2015-12-04 LAB — MRSA PCR SCREENING: MRSA BY PCR: NEGATIVE

## 2015-12-04 LAB — POTASSIUM: Potassium: 5.6 mmol/L — ABNORMAL HIGH (ref 3.5–5.1)

## 2015-12-04 LAB — PROTIME-INR
INR: 1.62
PROTHROMBIN TIME: 19.4 s — AB (ref 11.4–15.2)

## 2015-12-04 LAB — PHOSPHORUS: Phosphorus: 2.9 mg/dL (ref 2.5–4.6)

## 2015-12-04 MED ORDER — SODIUM CHLORIDE 0.9 % IV SOLN
INTRAVENOUS | Status: DC
  Administered 2015-12-04 – 2015-12-08 (×6): via INTRAVENOUS

## 2015-12-04 MED ORDER — NOREPINEPHRINE BITARTRATE 1 MG/ML IV SOLN
2.0000 ug/min | INTRAVENOUS | Status: DC
  Administered 2015-12-04: 2 ug/min via INTRAVENOUS
  Administered 2015-12-04: 25 ug/min via INTRAVENOUS
  Filled 2015-12-04 (×2): qty 4

## 2015-12-04 MED ORDER — MAGNESIUM SULFATE IN D5W 1-5 GM/100ML-% IV SOLN
1.0000 g | Freq: Once | INTRAVENOUS | Status: AC
  Administered 2015-12-04: 1 g via INTRAVENOUS
  Filled 2015-12-04: qty 100

## 2015-12-04 MED ORDER — SODIUM CHLORIDE 0.9 % IV BOLUS (SEPSIS)
1000.0000 mL | Freq: Once | INTRAVENOUS | Status: AC
  Administered 2015-12-04: 1000 mL via INTRAVENOUS

## 2015-12-04 MED ORDER — HEPARIN (PORCINE) IN NACL 100-0.45 UNIT/ML-% IJ SOLN
1400.0000 [IU]/h | INTRAMUSCULAR | Status: DC
  Administered 2015-12-04 – 2015-12-05 (×2): 1400 [IU]/h via INTRAVENOUS
  Filled 2015-12-04 (×5): qty 250

## 2015-12-04 MED ORDER — SODIUM CHLORIDE 0.9 % IV SOLN
200.0000 mg | Freq: Once | INTRAVENOUS | Status: AC
  Administered 2015-12-04: 200 mg via INTRAVENOUS
  Filled 2015-12-04: qty 200

## 2015-12-04 MED ORDER — NOREPINEPHRINE BITARTRATE 1 MG/ML IV SOLN
2.0000 ug/min | INTRAVENOUS | Status: DC
  Administered 2015-12-04: 22 ug/min via INTRAVENOUS
  Administered 2015-12-05: 14 ug/min via INTRAVENOUS
  Administered 2015-12-09: 5 ug/min via INTRAVENOUS
  Filled 2015-12-04 (×6): qty 16

## 2015-12-04 MED ORDER — SODIUM CHLORIDE 0.9 % IV BOLUS (SEPSIS)
1000.0000 mL | Freq: Three times a day (TID) | INTRAVENOUS | Status: DC | PRN
  Administered 2015-12-04: 1000 mL via INTRAVENOUS
  Filled 2015-12-04: qty 1000

## 2015-12-04 MED ORDER — SODIUM CHLORIDE 0.9 % IV BOLUS (SEPSIS)
500.0000 mL | Freq: Once | INTRAVENOUS | Status: AC
  Administered 2015-12-04: 500 mL via INTRAVENOUS

## 2015-12-04 MED ORDER — HEPARIN BOLUS VIA INFUSION
2000.0000 [IU] | Freq: Once | INTRAVENOUS | Status: AC
Start: 2015-12-04 — End: 2015-12-04
  Administered 2015-12-04: 2000 [IU] via INTRAVENOUS
  Filled 2015-12-04: qty 2000

## 2015-12-04 MED ORDER — SODIUM CHLORIDE 0.9 % IV SOLN
100.0000 mg | INTRAVENOUS | Status: DC
  Administered 2015-12-05 – 2015-12-08 (×4): 100 mg via INTRAVENOUS
  Filled 2015-12-04 (×5): qty 100

## 2015-12-04 NOTE — Progress Notes (Signed)
Called by RN regarding dusky appearance of fingers on right hand, pallor and cool to touch.  Assessed patient at bedside - left arm pink/warm to touch, palpable radial pulse.  Right arm pale from elbow down, brachial aline in place (placed in surgery), arm cool with dusky / purple first three fingers.  Unable to palpate radial pulse or ulnar on right.     Plan: D/c brachial aline now Stat arterial doppler of RUE to r/o arterial clot Warm compress of RUE Await results of doppler, if positive will need to discuss with Dr. Michaell CowingGross given recent abdominal surgery   Canary BrimBrandi Prescott Truex, NP-C [facility] Pgr: [phone] or if no answer [phone] 12/04/2015, 2:15 PM

## 2015-12-04 NOTE — Progress Notes (Signed)
VASCULAR LAB PRELIMINARY  PRELIMINARY  PRELIMINARY  PRELIMINARY  Arterial duplex scan of the right upper extremity completed.    Preliminary report:  There appears to be a thrombus noted along the distal brachial artery which is non occlusive. The axillary,radial, and ulnar appear patent. Velocities are greater in the ulnar than radial however all Doppler waveforms appear biphasic but sound triphasic  Shelbey Spindler, RVS 12/04/2015, 5:03 PM

## 2015-12-04 NOTE — Progress Notes (Signed)
Abby RN requested norepinephrine infusion be changed to quadruple strength and she acknowledged the need to reset the pump.    Charolotte Ekeom Hardy Harcum, PharmD, pager [phone]. 12/04/2015,12:45 PM.

## 2015-12-04 NOTE — Progress Notes (Signed)
eLink Physician-Brief Progress Note Patient Name: Danny GalRalph T Seago DOB: 1940-08-16 MRN: [id number]   Date of Service  12/04/2015  HPI/Events of Note  Persistent hypotension despite reductions in sedation and fluid bolus.  CVP of 10.  eICU Interventions  NE started for BP support     Intervention Category Intermediate Interventions: Hypotension - evaluation and management  DETERDING,ELIZABETH 12/04/2015, 5:40 AM

## 2015-12-04 NOTE — Anesthesia Postprocedure Evaluation (Signed)
Anesthesia Post Note  Patient: Ardyth GalRalph T Schimek  Procedure(s) Performed: Procedure(s) (LRB): LAPAROSCOPY DIAGNOSTIC, OMENTOPEXY, JEJUNOSTOMY, WASH OUT (N/A)  Patient location during evaluation: ICU Anesthesia Type: General Level of consciousness: sedated Pain management: pain level controlled Vital Signs Assessment: post-procedure vital signs reviewed and stable Respiratory status: patient remains intubated per anesthesia plan and patient on ventilator - see flowsheet for VS Cardiovascular status: blood pressure returned to baseline Anesthetic complications: no    Last Vitals:  Vitals:   12/04/15 0700 12/04/15 0729  BP:  (!) 90/36  Pulse: 74 75  Resp: 19 (!) 23  Temp:      Last Pain:  Vitals:   12/04/15 0417  TempSrc: Axillary  PainSc:                  Erika Slaby COKER

## 2015-12-04 NOTE — Progress Notes (Signed)
NT getting noon CBG noted discoloration in right hand fingers. RN compared right and left upper extremity and noted that right extremity was cold, pallor, dusky/purple colored in first 3 digits. No pulse was felt. Attempt to doppler pulse failed. CCM called and notified. Close monitor was continued until CCM NP Canary BrimBrandi Ollis arrived around [facility]2PM. NP assessed extremity and gave verbal order to remove A-line. Arterial duplex also completed. Results called in to CCM and MD contacted Surgeon on call to discuss course of treatment (heparin?). Heparin has been ordered and are waiting for pharmacy to place orders. Radial pulse is palpable and also able to be doppler.

## 2015-12-04 NOTE — Progress Notes (Signed)
ANTICOAGULATION CONSULT NOTE - Initial Consult  Pharmacy Consult for heparin IV Indication: RUE thrombus  Allergies  Allergen Reactions  . Flomax [Tamsulosin Hcl] Other (See Comments)    Leg weakness  . Lisinopril Cough  . Lorazepam     "i don't like it"  Prefers Xanax or valium  . Uroxatral [Alfuzosin Hcl Er] Other (See Comments)    Leg weakness    Patient Measurements: Height: 6\' 2"  (188 cm) Weight: 211 lb 3.2 oz (95.8 kg) IBW/kg (Calculated) : 82.2 Heparin Dosing Weight: 95 kg (TBW)  Vital Signs: Temp: 99.1 F (37.3 C) (10/20 1600) Temp Source: Oral (10/20 1600) BP: 173/30 (10/20 1700) Pulse Rate: 70 (10/20 1700)  Labs:  Recent Labs  12/03/15 0512 12/04/15 0600 12/04/15 0930  HGB 17.0  --  12.4*  HCT 50.6  --  35.9*  PLT 349  --  291  CREATININE 1.22 1.85*  --     Estimated Creatinine Clearance: 40.1 mL/min (by C-G formula based on SCr of 1.85 mg/dL (H)).   Medical History: Past Medical History:  Diagnosis Date  . Bilateral dry eyes   . Coronary artery disease 1997   bare mental stent right coronart artery  . Degenerative disc disease    LOWER BACK  . Dysphonia   . ED (erectile dysfunction)   . Gastroesophageal reflux disease   . Hyperlipidemia   . Hypertension   . Peptic ulcer   . Pneumonia    HISTORY OF IN CHILDHOOD  . Posterior vitreous detachment 10/30/2012  . Urinary hesitancy   . Wears glasses     Medications:  Scheduled:  . [START ON 12/05/2015] anidulafungin  100 mg Intravenous Q24H  . bisacodyl  10 mg Rectal Daily  . chlorhexidine  15 mL Mouth Rinse BID  . cycloSPORINE  1 drop Both Eyes BID  . enoxaparin (LOVENOX) injection  40 mg Subcutaneous Q24H  . insulin aspart  0-9 Units Subcutaneous Q4H  . mouth rinse  15 mL Mouth Rinse q12n4p  . pantoprazole (PROTONIX) IV  40 mg Intravenous Q12H  . piperacillin-tazobactam (ZOSYN)  IV  3.375 g Intravenous Q8H  . polyvinyl alcohol  2 drop Both Eyes BID   Infusions:  . sodium chloride 100  mL/hr at 12/04/15 1840  . dexmedetomidine 0.8 mcg/kg/hr (12/04/15 1836)  . norepinephrine (LEVOPHED) Adult infusion 20 mcg/min (12/04/15 1836)    Assessment: 75 yo M with placement of gastrostomy and feeding jejunostomy tubes, s/p recent laparoscopy with washout and omentopexy repair on 10/19, now with distal brachial thrombus likely related to A-line placed there; fingers purple with unpalpable pulse. Per surgery, ok to start heparin for thrombus.   Baseline INR, aPTT: pending  Prior anticoagulation: Lovenox 40 mg q24, last dose 10/18  Significant events: 10/20: brachial A-line removed  Today, 12/04/2015:  CBC: Hgb sl low but stable (reading of 17g this AM likely spurious)  No bleeding or infusion issues per nursing  CrCl: 40 ml/min  Goal of Therapy: Heparin level 0.3-0.7 units/ml Monitor platelets by anticoagulation protocol: Yes  Plan:  Heparin 2000 units IV bolus x 1  Heparin 1400 units/hr IV infusion  Check heparin level 8 hrs after start  Daily CBC, daily heparin level once stable  Monitor for signs of bleeding or thrombosis   Bernadene Personrew Leliana Kontz, PharmD Pager: [phone] 12/04/2015, 7:15 PM

## 2015-12-04 NOTE — Progress Notes (Signed)
eLink Physician-Brief Progress Note Patient Name: [name] DOB: [name]1942-06-15 MRN: [id number]   Date of Service  12/04/2015  HPI/Events of Note  Vascular duplex of R arm reveals a thrombus noted along the distal brachial artery which is non occlusive. The axillary,radial, and ulnar appear patent. Spoke with Dr. Marlene BastHogsworth who feels that it is OK to start the patient on a Heparin IV infusion.  eICU Interventions  Will order: 1. Heparin IV infusion per pharmacy.      Intervention Category Intermediate Interventions: Diagnostic test evaluation  Keiran Gaffey Eugene 12/04/2015, 6:14 PM

## 2015-12-04 NOTE — Progress Notes (Signed)
PULMONARY / CRITICAL CARE MEDICINE   Name: [name] MRN: [id number] DOB: 12-17-1940    ADMISSION DATE:  11/24/2015 CONSULTATION DATE:  12/03/2015  REFERRING MD:  Dr. Michaell Cowing  CHIEF COMPLAINT:  Gastric outlet obstruction.  HISTORY OF PRESENT ILLNESS:   75 year old male with PMH as below, which is significant for CAD, GERD, hypertension, hyperlipidemia. More recently, he developed gradual onset of intermittent epigastric pain for about 1 year. Initially treated for H. pylori with triple antibotic therapy, however symptoms persisted. He was evaluated by gastroenterology with endoscopy in January 2017, which demonstrated partial gastric outlet obstruction with pyloric channel ulcer and was treated with Carafate and pantoprazole. He had a follow-up endoscopy in May 2017 demonstrating no improvement. All biopsies were negative for malignancy. Overall, his condition progressed to include intermittent episodes of nausea, vomiting, and bloating. In August 2017 he was admitted related to partial gastric outlet obstruction secondary to chronic pyloric stricture and underwent distal gastrectomy with Billroth I anastomosis, anterior and posterior vagotomy, DOR fundoplication, and omentopexy. He progressed and was able to start taking by mouth's again. He presented again in September 2017 with complaints of abdominal bloating which was thought to be gastroparesis, he declined admission at that time and was recommended to continue a liquid diet.   11/23/2015 he can presented to [facility] emergency department with complaints of worsening abdominal distention with associated cramping heartburn. Abdominal CT demonstrated gastric dilation concerning for recurrent stricture. He underwent endoscopy which is suspicious for tightening at the site of the anastomosis with additional concerns for recurrent ulcer. Initially he was treated conservatively with NG tube decompression, IV proton pump inhibitor, H2  blocker, and Reglan. He was kept nothing by mouth. Despite these measures, his symptoms did not improve and he was taken to the OR 10/17 and underwent laparoscopic placement of feeding jejunostomy and gastrostomy tubes and EGD balloon dilation of duodenal stricture. 10/18 his jejunostomy tube became displaced and was removed. It was replaced with Foley catheter, however, there was a significant amount of gastric leakage. By 10/19 he began to relatively decompensate with worsening abdominal pain and increased respiratory rate with concern for peritonitis. He was taken again to the operating room for abdominal exploration and washout. During the procedure he was found have bilious peritonitis and pinhole leak around jejunostomy at pulling of suture. Post operatively he remained on ventilator and was taken to ICU for recovery. PCCM to see for further evaluation.  SUBJECTIVE:  RN reports pt required versed due to agitation.  Remains on 20 mcg levophed.  CVP 8  VITAL SIGNS: BP (!) 90/36   Pulse 75   Temp 97.9 F (36.6 C)   Resp (!) 23   Ht 6\' 2"  (1.88 m)   Wt 211 lb 3.2 oz (95.8 kg)   SpO2 97%   BMI 27.12 kg/m   HEMODYNAMICS: CVP:  [5 mmHg-10 mmHg] 6 mmHg  VENTILATOR SETTINGS: Vent Mode: PRVC FiO2 (%):  [40 %-100 %] 40 % Set Rate:  [14 bmp-20 bmp] 20 bmp Vt Set:  [620 mL] 620 mL PEEP:  [5 cmH20] 5 cmH20 Plateau Pressure:  [11 cmH20-16 cmH20] 16 cmH20  INTAKE / OUTPUT: I/O last 3 completed shifts: In: 8540.6 [P.O.:360; I.V.:6880.6; Other:90; NG/GT:160; IV Piggyback:1050] Out: 2568 [Urine:1095; Drains:1423; Blood:50]  PHYSICAL EXAMINATION: General:  Elderly male, sedate on vent  Neuro:  Sedated, no response to verbal stimuli, pupils 3mm R HEENT:  NCAT, ETT in place Cardiovascular:  RRR, no mgr Lungs:  CTA B,  vent supported breaths Abdomen:  Distended, three drains in place with G tube and feeding J tube, dressing c/d/i Musculoskeletal:  Normal bulk, no bony deformity Skin:  No rash  or skin breakdown  LABS:  BMET  Recent Labs Lab 11/30/15 0525 12/01/15 0509 12/03/15 0512 12/04/15 0600  NA 136  --  134* 129*  K 4.1  --  4.3 6.0*  CL 107  --  104 104  CO2 23  --  20* 19*  BUN 21*  --  38* 45*  CREATININE 0.61 0.70 1.22 1.85*  GLUCOSE 108*  --  150* 103*    Electrolytes  Recent Labs Lab 11/28/15 0443  11/30/15 0525 12/03/15 0512 12/04/15 0600  CALCIUM 8.3*  < > 8.4* 8.7* 7.1*  MG 2.0  --  1.9 1.8  --   PHOS 3.8  --  3.7 2.6  --   < > = values in this interval not displayed.  CBC  Recent Labs Lab 11/30/15 0525 12/03/15 0512  WBC 6.7 12.5*  HGB 11.2* 17.0  HCT 32.8* 50.6  PLT 253 349    Coag's No results for input(s): APTT, INR in the last 168 hours.  Sepsis Markers No results for input(s): LATICACIDVEN, PROCALCITON, O2SATVEN in the last 168 hours.  ABG  Recent Labs Lab 12/03/15 2300  PHART 7.243*  PCO2ART 52.9*  PO2ART 286*    Liver Enzymes  Recent Labs Lab 11/29/15 0445 11/30/15 0525 12/03/15 0512  AST 210* 86* 27  ALT 384* 236* 62  ALKPHOS 142* 121 78  BILITOT 0.6 0.6 0.6  ALBUMIN 2.9* 2.9* 2.6*    Cardiac Enzymes No results for input(s): TROPONINI, PROBNP in the last 168 hours.  Glucose  Recent Labs Lab 12/03/15 0322 12/03/15 0559 12/03/15 1234 12/04/15 0023 12/04/15 0322 12/04/15 0821  GLUCAP 151* 157* 153* 96 74 75    Imaging Dg Chest Port 1 View  Result Date: 12/04/2015 CLINICAL DATA:  75 year old male status post endotracheal tube placement. EXAM: PORTABLE CHEST 1 VIEW COMPARISON:  Chest radiograph dated 11/29/2015 FINDINGS: Endotracheal tube the tip approximately 5 cm above the carina. Left subclavian central venous line with tip over central SVC. Left lung base linear atelectasis versus less likely infiltrate. The right lung is clear. No pleural effusion or pneumothorax. The cardiac silhouette is within normal limits. No acute osseous pathology. IMPRESSION: Interval placement of an endotracheal  and left subclavian central line with tips in appropriate positioning. No pneumothorax. Electronically Signed   By: Elgie CollardArash  Radparvar M.D.   On: 12/04/2015 04:02     STUDIES:  10/11 CT abdomen/pelvis> significant distension of the stomach with layering fluid and gas, NG present, worrisome for recurrent gastric outlet obstruction.  RLL consolidation, cholecycstectomy  CULTURES: 10/19 blood >>  ANTIBIOTICS: 10/17 cefazolin, metronidazole x1 dose Erythromycin 10/18 >> 10/19 Zosyn 10/19 >> Anidulofungin 10/20 >>     SIGNIFICANT EVENTS: 10/10  admission 10/17  Lap placement of J & G tubes, EGD w/ balloon dilation of duodenal stricture, gastric biopsies, closure of gastrotomy 10/18  sundowning, pulled out G tube 10/19  laparaoscopy (findings gastric leak, bilious ascites, jejunal disruption), omentoplexy of jejunal disruption, gastric leak  LINES/TUBES: 10/19 ETT >> 10/19 CVL left subclavian >>  10/19 R brachial arterial line >>  10/19 abdominal drain x3 >> 10/19 G tube >> 10/19 J tube >>  DISCUSSION: 75 y/o male with a lengthy history of peptic ulcer disease who had a vagotomy and bilroth in August now admitted with gastric obstruction due to  gastritis and recurrent ulcers. He had a G and J tube placed on 10/17 but in the midst of apparent sundowning on 10/18 he pulled out the G tube and now has a gastric leak, jejunal disruption and peritonitis.  He is s/p laparoscopic repair of the jejunal disruption on 10/19 and had several drains placed in addition to replacement of G and J tubes.  He returns to the ICU intubated.   ASSESSMENT / PLAN:  PULMONARY A: Inability to protect airway in post-operative setting.  P:   PRVC 8cc/kg Wean PEEP / FiO2 for sats >92% Daily assessment for SBT / WUA VAP prevention protocol Intermittent CXR  CARDIOVASCULAR A:  Shock - suspect septic + hypovolemic  Hypertension, likely sedation/pain related P:  Levophed for MAP > 65 Tele  monitoring Trend CVP Q4 CVP goal 12  NS @ 100 ml/hr Fluid bolus per surgery as ordered  RENAL A:   AKI - in setting of peritonitis and septic shock, rising Cr Hyperkalemia  P:   Aggressive IVF per surgery Monitor BMET and UOP Replace electrolytes as needed Repeat potassium now May need kayexalate rectally    GASTROINTESTINAL A:   Gastric ulcers Gastric outlet obstruction Gastric leak post displacement of G-tube Jejunal disruption (pinpoint) s/p omentoplexy Peritonitis P:   No enteral feedings Defer timing of feeding / TPN to surgery Monitor abdominal drain output G/J management per surgery IV PPI given clinical scenario   HEMATOLOGIC A:   No acute issues P:  Monitor for bleeding SCD's for DVT prophylaxis   INFECTIOUS A:   Peritonitis secondary to gastric leak Gastric outlet obstruction s/p EGD dilation P:   ABX as above F/u cultures Add antifungal coverage with shock  ENDOCRINE A:   Mild hyperglycemia   P:   SSI, target 140-180, q4h  NEUROLOGIC A:   Sedation needs for vent syncrhony Acute encephalopathy/Delirium prior to surgery P:   RASS goal: -1 Precedex gtt PRN fentanyl and versed   FAMILY  - Updates: updated at bedside  - Inter-disciplinary family meet or Palliative Care meeting due by:  day 7  Canary Brim, NP-C [facility] Pgr: [phone] or if no answer [phone] 12/04/2015, 8:35 AM  Attending Note:  I have examined patient, reviewed labs, studies and notes. I have discussed the case with B Ollis, and I agree with the data and plans as amended above. 75 year old man with a complicated history of peptic ulcer disease, vagotomy, Bilroth 2. He underwent surgical GJ tube placement on 10/17 but unfortunately this was pulled on 10/18 with associated gastric leak, jejunal disruption and peritonitis. He went back to the operating room 10/19 for washout, drain placement, repair of the jejunal disruption and replacement of his  GJ tube. He returned to the ICU intubated and sedated. He was in shock from presumed sepsis and peritonitis. On my evaluation this morning he remains in shock, is on norepinephrine infusion. He is mildly tachycardic with otherwise normal heart sounds. His lungs are clear to auscultation. He is on FiO2 40%, PEEP 5. His laboratory evaluation reveals an acute renal failure with associated hyperkalemia. We will continue IV fluid resuscitation, we norepinephrine as able. He will need repeat metabolic panel this afternoon, may require rectal Kayexalate or even possibly renal consultation if his hyperkalemia persists despite resuscitation. I will continue his Zosyn for peritonitis, add anidulafungin on 10/20, continue to monitor his drain output. No plans to extubate today given his hemodynamic instability. Hopefully we will be able to assess him for  wake up and spontaneous breathing on 10/21. Independent critical care time is 40 minutes.   Levy Pupa, MD, PhD 12/04/2015, 10:48 AM [facility] [phone] or if no answer [phone]

## 2015-12-04 NOTE — Transfer of Care (Signed)
Immediate Anesthesia Transfer of Care Note  Patient: Ardyth GalRalph T Mckibben  Procedure(s) Performed: Procedure(s): LAPAROSCOPY DIAGNOSTIC, OMENTOPEXY, JEJUNOSTOMY, WASH OUT (N/A)  Patient Location: PACU and ICU  Anesthesia Type:General  Level of Consciousness: Patient remains intubated per anesthesia plan  Airway & Oxygen Therapy: Patient remains intubated per anesthesia plan and Patient placed on Ventilator (see vital sign flow sheet for setting)  Post-op Assessment: Report given to RN and Post -op Vital signs reviewed and unstable, Anesthesiologist notified  Post vital signs: Reviewed and stable  Last Vitals:  Vitals:   12/04/15 0700 12/04/15 0729  BP:  (!) 90/36  Pulse: 74 75  Resp: 19 (!) 23  Temp:      Last Pain:  Vitals:   12/04/15 0417  TempSrc: Axillary  PainSc:       Patients Stated Pain Goal: 3 (12/03/15 0800)  Complications: No apparent anesthesia complications

## 2015-12-04 NOTE — Progress Notes (Signed)
Nutrition Follow-up  DOCUMENTATION CODES:   Severe malnutrition in context of acute illness/injury  INTERVENTION:  - Will monitor for nutrition support initiation per Surgery.  - RD will follow-up 10/23.  NUTRITION DIAGNOSIS:   Inadequate oral intake related to inability to eat as evidenced by NPO status -ongoing  GOAL:   Patient will meet greater than or equal to 90% of their needs -unable to meet  MONITOR:   Vent status, Weight trends, Labs, Skin, I & O's, Other (Comment) (initiation of nutrition support)  REASON FOR ASSESSMENT:   Ventilator  ASSESSMENT:   75 year old male presenting for a post-operative visit. Note for "Post-Operative": Patient returns one month status post robotic resection.  Distal gastrectomy with Billroth I gastroduodenal reconstruction for chronic ulcer causing obstruction.  09/25/2015  10/20 Pt remains intubated following surgery yesterday: ex lap with omentopexy of jejunal disruption and washout with placement of drains. Procedures done on  10/17: placement of G- and J-tubes and EGD with dilation of duodenal stricture, gastric biopsies. Per Surgery note this AM, plan for J-tube study via Fluoroscopy 10/23 and if no leak present, can start trickle TF at that time. Also per note, no plan to replace PICC at this time.   Spoke with pt's niece, who is at bedside. She is concerned about nutritional status with no nutrition support. Talked with her about previous TPN provision and TF yesterday. She states that she has not been able to talk with any one from the surgical team concerning plan for nutrition. Shared with her possible plan to re-start nutrition support on Monday but encouraged her to talk with member of the surgical team for further information concerning this.   Per chart review, weight -3.1 kg from yesterday. CBW used to re-estimate nutrition needs. Will continue to monitor weight trends and adjust as needed. Will follow-up Monday for plan per  Surgery concerning nutrition support.  Patient is currently intubated on ventilator support MV: 12.2 L/min Temp (24hrs), Avg:97.4 F (36.3 C), Min:94.3 F (34.6 C), Max:98.9 F (37.2 C) Propofol: none BP: 153/23 and MAP: 66  Medications reviewed; 10 mg Dulcolax/day, sliding scale Novolog, PRN Zofran, 40 mg IV Protonix BID. Labs reviewed; Na: 129 mmol/L, K: 6 mg/dL, BUN: 45 mg/dL, creatinine: 2.131.85 mg/dL, Ca: 7.1 mg/dL, GFR: 34 mL/min.  IVF: NS @ 100 mL/hr. Drip: Precedex @ 0.7 mcg/kg/hr.     10/19 - Patient pulled out PICC and G-tube overnight.  - Pt with increased confusion.  - TPN not planned to restart.  - Currently receiving Osmolite 1.5 @ 30 ml/hr and 30 ml Prostat via J-tube, providing 1180 kcal and 60g protein. - In total pt receiving from TF + IVF:1938 kcal and 60g protein.  - Nurse techs at bedside report pt was tolerating feeds. However, pt is leaking from the G-tube site. - Would advance as able towards goal rate of 65 ml/hr.   Diet Order:  Diet NPO time specified  Skin:  Wound (see comment) (Abdominal incision)  Last BM:  10/16  Height:   Ht Readings from Last 1 Encounters:  12/03/15 6\' 2"  (1.88 m)    Weight:   Wt Readings from Last 1 Encounters:  12/04/15 211 lb 3.2 oz (95.8 kg)    Ideal Body Weight:  80.9 kg  BMI:  Body mass index is 27.12 kg/m.  Estimated Nutritional Needs:   Kcal:  2076  Protein:  115-144 grams (1.2-1.5 grams/kg)  Fluid:  per Surgery/MD/NP given hyponatremia and renal function tests  EDUCATION NEEDS:  No education needs identified at this time    Jarome Matin, MS, RD, LDN Inpatient Clinical Dietitian Pager # [phone] After hours/weekend pager # [phone]

## 2015-12-04 NOTE — Consult Note (Signed)
Consult Note  Patient name: [name] MRN: [id number] DOB: 11-Nov-1940 Sex: male  Consulting Physician:  Dr. Johna Sheriff  Reason for Consult: No chief complaint on file.   HISTORY OF PRESENT ILLNESS: 75 year old male who is s/p distal gastrectomy with Billroth I gastroduodenal reconstruction for chronic ulcer causing obstruction on 09/25/2015.  On 12/01/2015 he underwent G and J tube placement for gastric ileus.  On 12-03-2015, he was taken back to the OR for peritonitis and had a washout and omentopexy of a jejunal disruption. He remained intubated.  On 12-04-2015, he was noted to have a dusky appearance of the fingers on his right hand.  At that time, he had a brachial a-line which was removed.  Radial and ulnar pulses were not palpable.  At 17:00 a duplex revealed non-occlusive thrombus in the brachial artery.  The axillary,radial, and ulnar appear patent. Velocities are greater in the ulnar than radial however all Doppler waveforms appear biphasic but sound triphasic.  He remains intubated as he can not protect his airway.  He is in likely septic /hypovolemic shock.  He has acute renal insufficiency.  He suffers from CAD, s/p stenting.  He is on a statin for hypercholesterolemia, and is medically managed for hypertension.  He is a former smoker  Past Medical History:  Diagnosis Date  . Bilateral dry eyes   . Coronary artery disease 1997   bare mental stent right coronart artery  . Degenerative disc disease    LOWER BACK  . Dysphonia   . ED (erectile dysfunction)   . Gastroesophageal reflux disease   . Hyperlipidemia   . Hypertension   . Peptic ulcer   . Pneumonia    HISTORY OF IN CHILDHOOD  . Posterior vitreous detachment 10/30/2012  . Urinary hesitancy   . Wears glasses     Past Surgical History:  Procedure Laterality Date  . CARDIAC CATHETERIZATION    . CHOLECYSTECTOMY    . ESOPHAGOGASTRODUODENOSCOPY N/A 12/01/2015   Procedure: ESOPHAGOGASTRODUODENOSCOPY (EGD)  BALLOON DILATION OF DUODENAL STRICTURE;  Surgeon: Karie Soda, MD;  Location: [facility];  Service: General;  Laterality: N/A;  . ESOPHAGOGASTRODUODENOSCOPY (EGD) WITH PROPOFOL N/A 07/01/2015   Procedure: ESOPHAGOGASTRODUODENOSCOPY (EGD) WITH PROPOFOL;  Surgeon: Willis Modena, MD;  Location: [facility];  Service: Endoscopy;  Laterality: N/A;  . ESOPHAGOGASTRODUODENOSCOPY (EGD) WITH PROPOFOL N/A 11/23/2015   Procedure: ESOPHAGOGASTRODUODENOSCOPY (EGD) WITH PROPOFOL;  Surgeon: Willis Modena, MD;  Location: [facility] ENDOSCOPY;  Service: Endoscopy;  Laterality: N/A;  may need to intubate  . EYE SURGERY     growth on right eye removed   . LAPAROSCOPIC GASTROSTOMY N/A 12/01/2015   Procedure: LAPAROSCOPIC PLACEMENT OF FEEDING JEJUNOSTOMY AND GASTROSTOMY TUBE;  Surgeon: Karie Soda, MD;  Location: [facility];  Service: General;  Laterality: N/A;  . LAPAROSCOPY N/A 12/03/2015   Procedure: LAPAROSCOPY DIAGNOSTIC, OMENTOPEXY, JEJUNOSTOMY, WASH OUT;  Surgeon: Karie Soda, MD;  Location: [facility];  Service: General;  Laterality: N/A;  . STENT TO HEART     2 1997 AND 1 IN 1996  . TONSILLECTOMY    . XI ROBOTIC VAGOTOMY AND ANTRECTOMY N/A 09/25/2015   Procedure: XI ROBOTIC ANTERIOR AND POSTERIOR VAGOTOMY, BILROTH I  ANASTOMOSIS DOR FUNDIPLICATION, OMENTOPEXY  UPPER ENDOSCOPY;  Surgeon: Karie Soda, MD;  Location: [facility];  Service: General;  Laterality: N/A;    Social History   Social History  . Marital status: Married    Spouse name: N/A  . Number of children: N/A  .  Years of education: N/A   Occupational History  . Not on file.   Social History Main Topics  . Smoking status: Former Smoker    Packs/day: 2.00    Years: 20.00    Types: Cigarettes    Quit date: 02/15/1995  . Smokeless tobacco: Never Used  . Alcohol use No  . Drug use: No  . Sexual activity: Not on file   Other Topics Concern  . Not on file   Social History Narrative  . No narrative on file    Family History  Problem Relation Age of  Onset  . Heart attack Mother     Allergies as of 11/24/2015 - Review Complete 11/24/2015  Allergen Reaction Noted  . Flomax [tamsulosin hcl] Other (See Comments) 06/18/2015  . Lisinopril Cough 04/24/2013  . Uroxatral [alfuzosin hcl er] Other (See Comments) 06/18/2015    No current facility-administered medications on file prior to encounter.    Current Outpatient Prescriptions on File Prior to Encounter  Medication Sig Dispense Refill  . ALPRAZolam (XANAX) 0.5 MG tablet Take 0.5 mg by mouth at bedtime as needed for sleep or anxiety.    [name] aspirin EC 81 MG tablet Take 81 mg by mouth every evening.     [name] atorvastatin (LIPITOR) 40 MG tablet Take 40 mg by mouth at bedtime.     Tery Sanfilippo Calcium (STOOL SOFTENER PO) Take 1 capsule by mouth daily as needed (for constipation).     . Multiple Vitamin (MULTIVITAMIN) tablet Take 2 tablets by mouth daily with breakfast.     . nitroGLYCERIN (NITROSTAT) 0.4 MG SL tablet Place 0.4 mg under the tongue every 5 (five) minutes as needed for chest pain.    . pantoprazole (PROTONIX) 40 MG tablet Take 40 mg by mouth 2 (two) times daily.    . Polyvinyl Alcohol-Povidone (REFRESH OP) Place 2 drops into both eyes as needed (For dry eyes.).     [name] RESTASIS 0.05 % ophthalmic emulsion Place 1 drop into both eyes 2 (two) times daily.     . valsartan (DIOVAN) 80 MG tablet Take 80 mg by mouth daily with breakfast.     . metoCLOPramide (REGLAN) 5 MG tablet Take 1-2 tablets (5-10 mg total) by mouth every 6 (six) hours as needed for nausea or vomiting. (Patient not taking: Reported on 11/24/2015) 40 tablet 1  . traMADol (ULTRAM) 50 MG tablet Take 1-2 tablets (50-100 mg total) by mouth every 6 (six) hours as needed for moderate pain or severe pain. (Patient not taking: Reported on 11/24/2015) 30 tablet 0     REVIEW OF SYSTEMS: Unable to obtain given intubation and sedation  PHYSICAL EXAMINATION: General: The patient appears their stated age.  Vital signs are BP (!)  137/27   Pulse 71   Temp 98.3 F (36.8 C) (Oral)   Resp 20   Ht 6\' 2"  (1.88 m)   Wt 211 lb 3.2 oz (95.8 kg)   SpO2 97%   BMI 27.12 kg/m  Pulmonary: intubated HEENT:  No gross abnormalities Musculoskeletal: There are no major deformities.   Neurologic: sedated, does not respond to verbal stimuli Skin: mottling of right index finger Psychiatric: unable to assess due to sedation Cardiovascular: palpable right radial pulse.  I hear a brisk ulnar anf palmar arch doppler signal and a faint signal in the proximal digital artery to the right index finger  Diagnostic Studies: I have reviewed his duplex with the following findings: There appears to be a thrombus noted along the  distal brachial artery which is non occlusive. The axillary,radial, and ulnar appear patent. Velocities are greater in the ulnar than radial however all Doppler waveforms appear biphasic but sound triphasic     Assessment:  Ischemic right index finger, likely from embolic evant from right brachial a-line Plan: The thrombus in the brachial artery is non occlusive, and the patient appears to have adequate perfusion to the hand with the exception to the index finger.  I suspect he has had an embolic event to the index finger secondary to the brachial clot from the a-line which is now out.  Given his current condition, I would not recommend operative exploration of the brachial artery, as this would involve transfer to [facility].  His wife also does not want any more procedures to be done at this time.  I would continue IV heparin.  Hopefully the perfusion to the finger will improve with time.  I did tell his wife that he is at risk for digital amputation.  Avoid pressors if possible.  Keep hand warm.  Please contact me if his condition changes.     Jorge NyV. Wells Beyonca Wisz IV, M.D. [facility] Office: [phone] Pager:  [phone]

## 2015-12-04 NOTE — Progress Notes (Addendum)
Danny  [name]., [name], [place] [address] Phone: [phone] FAX: [phone]   BRENTIN SHIN [id number] 1940-10-23  CARE TEAM:  PCP: Irven Shelling, MD  Outpatient Care Team: Patient Care Team: Danny Orn, MD as PCP - General (Internal Medicine) Danny Boston, MD as Consulting Physician (General Surgery) Danny Silence, MD as Consulting Physician (Gastroenterology)  Inpatient Treatment Team: Treatment Team: Attending Provider: Michael Boston, MD; Consulting Physician: Danny Silence, MD; Technician: [name], NT; Technician: [name], NT; Technician: [name], NT; Speech Language Pathologist: [name], [name]; Registered Nurse: Danny Meager, RN; Occupational Therapist: Mosetta [name], OT; Consulting Physician: Md Pccm, MD; Rounding Team: Md Pccm, MD; Technician: Gardiner Ramus, [place]  SURGERY 12/03/2015  POST-OPERATIVE DIAGNOSIS:    Peritonitis Jejunal disruption Gastric leak  PROCEDURE:    LAPAROSCOPY DIAGNOSTIC OMENTOPEXY of jejunal disruption [place] OUT x 21L with drains placement  SURGEON:  Danny Boston, MD   Post-Op 12/01/2015  POST-OPERATIVE DIAGNOSIS:    RECURRENT GASTRIC OUTLET OBSTRUCTION DUE to significant edema at gastroduodenal anastomosis. GASTRITIS Failure to thrive with malnutrition  PROCEDURE:   LAPAROSCOPIC PLACEMENT OF FEEDING JEJUNOSTOMY AND GASTROSTOMY TUBEs ESOPHAGOGASTRODUODENOSCOPY (EGD) BALLOON DILATION OF DUODENAL STRICTURE Gastric biopsies  Closure of gastrotomy  SURGEON:  Danny Boston, MD  Prior surgery 09/25/2015  POST-OPERATIVE DIAGNOSIS:  Partial gastric outlet obstruction due to chronic pyloric stricture  PROCEDURE:   XI ROBOTIC distal gastrectomy BILROTH I  ANASTOMOSIS  ANTERIOR AND POSTERIOR VAGOTOMY DOR (Anterior 638 degree) FUNDIPLICATION OMENTOPEXY UPPER ENDOSCOPY  SURGEON:  Danny Boston, MD   Problem  List:   Principal Problem:   Bile peritonitis due to gastric tube dislodgement Active Problems:   Essential hypertension, benign   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 09/25/2015   Hyperlipidemia   Anxiety state   Gastric outlet obstruction   Gastric Ileus    Protein-calorie malnutrition, moderate (HCC)   Encephalopathy acute   Endotracheally intubated   On mechanically assisted ventilation (HCC)    Assessment/Plan:  GUARDED s/p patching at jejunum & resecuring Gtube & massive washout  NO ENTERAL MEDS with leaks around G & J tubes.  IV meds only  -Gtube to LIWS for ileus & GD anastomotic stricture  - J tube (LLQ 16Fr MicKey) study fluoro Monday - if no leak, then start low rate TF's.  Keep to gravity bag for now  -volume/pressors for shock  -f/u labs  -wean vent as tolerated  -Zosyn IV x 5 days minimum  - -PPI, H2B for gastriris./  F/u Bx's for Hpylori neg  Diagnosis Stomach, biopsy - CHRONIC FOCALLY ACTIVE GASTRITIS. - THERE IS NO EVIDENCE OF HELICOBACTER PYLORI, DYSPLASIA OR MALIGNANCY. - SEE COMMENT. Microscopic Comment A Warthin-Starry stain is negative for the presence of Helicobacter pylori organisms. (JBK:gt, 12/04/15) Enid Cutter MD Pathologist, Electronic Signature (Case signed 12/04/2015) Specimen [name] and Clinical Information Specimen(s) Obtained: Stomach, biopsy Specimen Clinical  -Since he pulled out his PICC line, hold off on replacement and restarting TNA until shock cleared AND if leaking at J tube study Monday  -Sedation/Anxiolysis - challenge with pt that goes from sleeping to pulling at lines/tubes.  mits for now.   transitioning from Xanax to Valium.  We will try and give at bedtime primarily and avoid more often.  Tried using enteral dose for a little more smooth sedation.  Have a sitter.  Getting closer to the nursing station.  Sundowning precautions.     VTE prophylaxis -  SCDs     [name], M.D.,  F.A.C.S. Gastrointestinal and Minimally Invasive Surgery [facility] [address], [place], [place] [address] [phone] Main / Paging    12/04/2015   Subjective:  Wakes up confused Low BP - better w IVF & low dose levophed Nurse in room  Daughter in room  Objective:  Vital signs:  Vitals:   12/04/15 0600 12/04/15 0605 12/04/15 0610 12/04/15 0615  BP: (!) 80/25     Pulse: 80 78 78 75  Resp: _0 Temp: 97.9 F (36.6 C)     TempSrc:      SpO2: 99% 100% 100% 100%  Weight:      Height:        Last BM Date: 11/30/15  Intake/Output   Yesterday:  10/19 0701 - 10/20 0700 In: 7310.6 [P.O.:360; I.V.:6310.6; IV Piggyback:550] Out: 0488 [Urine:600; Drains:1133; Blood:50] This shift:  Total I/O In: 6574.3 [I.V.:6024.3; IV Piggyback:550] Out: 8916 [Urine:250; Drains:983; Blood:50]  Bowel function:  Flatus: No  BM:  No  Drain: Gtube thin bilious.  J tube minimal    Physical Exam:  General: Pt awake/alert/oriented x4 in No acute distress at best.  Then seems a little groggy and sleepy. Eyes: PERRL, normal EOM.  Sclera clear.  No icterus Neuro: CN II-XII intact w/o focal sensory/motor deficits. Lymph: No head/neck/groin lymphadenopathy Psych:  No delerium/psychosis/paranoia HENT: Normocephalic, Mucus membranes moist.  No thrush.  Mildly slurred speech Neck: Supple, No tracheal deviation Chest: CTAB. Chest wall pain w good excursion CV:  Pulses intact.  Regular rhythm MS: Normal AROM mjr joints.  No obvious deformity Abdomen:  G tube LUQ to gravity old blood/bile - hooked to LIWS.  J tube in LLQ hooked to gravity bag..    I resecured with extra tape.  Mostly firm.  Very distended.  Tenderness at Tube sites and lower abdomen..  Peritonitis but less TTP GU:  Foley in place.  NEMG Ext:  SCDs BLE.  No mjr edema.  No cyanosis Skin: No petechiae / purpura  Results:   Diagnosis Stomach, biopsy - CHRONIC FOCALLY ACTIVE  GASTRITIS. - THERE IS NO EVIDENCE OF HELICOBACTER PYLORI, DYSPLASIA OR MALIGNANCY. - SEE COMMENT. Microscopic Comment A Warthin-Starry stain is negative for the presence of Helicobacter pylori organisms. (JBK:gt, 12/04/15) Enid Cutter MD Pathologist, Electronic Signature (Case signed 12/04/2015) Specimen [name] and Clinical Information Specimen(s) Obtained: Stomach, biopsy Specimen Clinical  Labs: Results for orders placed or performed during the hospital encounter of 11/24/15 (from the past 48 hour(s))  Prealbumin     Status: Abnormal   Collection Time: 12/02/15  8:52 AM  Result Value Ref Range   Prealbumin 12.5 (L) 18 - 38 mg/dL    Comment: Performed at [facility]  Glucose, capillary     Status: Abnormal   Collection Time: 12/02/15  5:03 PM  Result Value Ref Range   Glucose-Capillary 176 (H) 65 - 99 mg/dL  Glucose, capillary     Status: Abnormal   Collection Time: 12/02/15  5:56 PM  Result Value Ref Range   Glucose-Capillary 189 (H) 65 - 99 mg/dL  Glucose, capillary     Status: Abnormal   Collection Time: 12/02/15  8:10 PM  Result Value Ref Range   Glucose-Capillary 246 (H) 65 - 99 mg/dL   Comment 1 Notify RN    Comment 2 Document in Chart   Glucose, capillary     Status: Abnormal   Collection Time: 12/03/15  12:00 AM  Result Value Ref Range   Glucose-Capillary 193 (H) 65 - 99 mg/dL   Comment 1 Notify RN    Comment 2 Document in Chart   Glucose, capillary     Status: Abnormal   Collection Time: 12/03/15  3:22 AM  Result Value Ref Range   Glucose-Capillary 151 (H) 65 - 99 mg/dL  Comprehensive metabolic panel     Status: Abnormal   Collection Time: 12/03/15  5:12 AM  Result Value Ref Range   Sodium 134 (L) 135 - 145 mmol/L   Potassium 4.3 3.5 - 5.1 mmol/L   Chloride 104 101 - 111 mmol/L   CO2 20 (L) 22 - 32 mmol/L   Glucose, Bld 150 (H) 65 - 99 mg/dL   BUN 38 (H) 6 - 20 mg/dL   Creatinine, Ser 1.22 0.61 - 1.24 mg/dL   Calcium 8.7 (L) 8.9 - 10.3 mg/dL    Total Protein 5.7 (L) 6.5 - 8.1 g/dL   Albumin 2.6 (L) 3.5 - 5.0 g/dL   AST 27 15 - 41 U/L   ALT 62 17 - 63 U/L   Alkaline Phosphatase 78 38 - 126 U/L   Total Bilirubin 0.6 0.3 - 1.2 mg/dL   GFR calc non Af Amer 56 (L) >60 mL/min   GFR calc Af Amer >60 >60 mL/min    Comment: (NOTE) The eGFR has been calculated using the CKD EPI equation. This calculation has not been validated in all clinical situations. eGFR's persistently <60 mL/min signify possible Chronic Kidney Disease.    Anion gap 10 5 - 15  Magnesium     Status: None   Collection Time: 12/03/15  5:12 AM  Result Value Ref Range   Magnesium 1.8 1.7 - 2.4 mg/dL  Phosphorus     Status: None   Collection Time: 12/03/15  5:12 AM  Result Value Ref Range   Phosphorus 2.6 2.5 - 4.6 mg/dL  CBC     Status: Abnormal   Collection Time: 12/03/15  5:12 AM  Result Value Ref Range   WBC 12.5 (H) 4.0 - 10.5 K/uL   RBC 5.51 4.22 - 5.81 MIL/uL   Hemoglobin 17.0 13.0 - 17.0 g/dL   HCT 50.6 39.0 - 52.0 %   MCV 91.8 78.0 - 100.0 fL   MCH 30.9 26.0 - 34.0 pg   MCHC 33.6 30.0 - 36.0 g/dL   RDW 13.0 11.5 - 15.5 %   Platelets 349 150 - 400 K/uL  Glucose, capillary     Status: Abnormal   Collection Time: 12/03/15  5:59 AM  Result Value Ref Range   Glucose-Capillary 157 (H) 65 - 99 mg/dL   Comment 1 Notify RN    Comment 2 Document in Chart   Glucose, capillary     Status: Abnormal   Collection Time: 12/03/15 12:34 PM  Result Value Ref Range   Glucose-Capillary 153 (H) 65 - 99 mg/dL   Comment 1 Notify RN    Comment 2 Document in Chart   Blood gas, arterial     Status: Abnormal   Collection Time: 12/03/15 11:00 PM  Result Value Ref Range   FIO2 100.00    Delivery systems VENTILATOR    Mode PRESSURE REGULATED VOLUME CONTROL    VT 620 mL   LHR 14 resp/min   Peep/cpap 5.0 cm H20   pH, Arterial 7.243 (L) 7.350 - 7.450   pCO2 arterial 52.9 (H) 32.0 - 48.0 mmHg   pO2, Arterial 286 (H) 83.0 -  108.0 mmHg   Bicarbonate 22.1 20.0 - 28.0  mmol/L   Acid-base deficit 5.3 (H) 0.0 - 2.0 mmol/L   O2 Saturation 99.4 %   Patient temperature 37.0    Collection site A-LINE    Drawn by [id number]    Sample type ARTERIAL DRAW   MRSA PCR Screening     Status: None   Collection Time: 12/03/15 11:30 PM  Result Value Ref Range   MRSA by PCR NEGATIVE NEGATIVE    Comment:        The GeneXpert MRSA Assay (FDA approved for NASAL specimens only), is one component of a comprehensive MRSA colonization surveillance program. It is not intended to diagnose MRSA infection nor to guide or monitor treatment for MRSA infections.   Glucose, capillary     Status: None   Collection Time: 12/04/15 12:23 AM  Result Value Ref Range   Glucose-Capillary 96 65 - 99 mg/dL  Glucose, capillary     Status: None   Collection Time: 12/04/15  3:22 AM  Result Value Ref Range   Glucose-Capillary 74 65 - 99 mg/dL    Imaging / Studies: Dg Abd 1 View  Result Date: 12/02/2015 CLINICAL DATA:  75 year old male with gastrostomy tube removal and placement of a Foley catheter through the gastrostomy. EXAM: ABDOMEN - 1 VIEW COMPARISON:  Abdominal radiograph dated 11/29/2015 and CT dated 11/25/2015 FINDINGS: A percutaneous catheter is noted in the left upper abdomen. 25 cc of Gastrografin was administered through the catheter which appears to opacified the stomach. The tip of the catheter and the balloon noted within the stomach. Small amount of contrast noted extending along the tube and over the patient's skin. There has been interval resolution of the previously seen gastric distention. There is no bowel dilatation or evidence of obstruction. No free air identified. Right upper quadrant cholecystectomy clips noted. The soft tissues and osseous structures are unremarkable. IMPRESSION: Percutaneously placed Foley catheter through the gastrostomy with tip and balloon appear in the stomach. Interval resolution of the previously seen air distended stomach. Electronically Signed    By: Anner Crete M.D.   On: 12/02/2015 22:49   Dg Chest Port 1 View  Result Date: 12/04/2015 CLINICAL DATA:  75 year old male status post endotracheal tube placement. EXAM: PORTABLE CHEST 1 VIEW COMPARISON:  Chest radiograph dated 11/29/2015 FINDINGS: Endotracheal tube the tip approximately 5 cm above the carina. Left subclavian central venous line with tip over central SVC. Left lung base linear atelectasis versus less likely infiltrate. The right lung is clear. No pleural effusion or pneumothorax. The cardiac silhouette is within normal limits. No acute osseous pathology. IMPRESSION: Interval placement of an endotracheal and left subclavian central line with tips in appropriate positioning. No pneumothorax. Electronically Signed   By: Anner Crete M.D.   On: 12/04/2015 04:02    Medications / Allergies: per chart  Antibiotics: Anti-infectives    Start     Dose/Rate Route Frequency Ordered Stop   12/04/15 0600  cefoTEtan (CEFOTAN) 2 g in dextrose 5 % 50 mL IVPB     2 g 100 mL/hr over 30 Minutes Intravenous On call to O.R. 12/03/15 1821 12/03/15 2007   12/04/15 0400  piperacillin-tazobactam (ZOSYN) IVPB 3.375 g     3.375 g 12.5 mL/hr over 240 Minutes Intravenous Every 8 hours 12/03/15 2229     12/02/15 0000  erythromycin ethylsuccinate (EES) 200 MG/5ML suspension 400 mg  Status:  Discontinued    Comments:  Administer via J tube (16Fr LLQ)  400 mg Oral Every 6 hours 12/01/15 2020 12/03/15 0748   11/27/15 1200  erythromycin 500 mg in sodium chloride 0.9 % 100 mL IVPB     500 mg 100 mL/hr over 60 Minutes Intravenous Every 6 hours 11/27/15 0940 11/29/15 0608   11/26/15 0830  erythromycin 250 mg in sodium chloride 0.9 % 100 mL IVPB  Status:  Discontinued     250 mg 100 mL/hr over 60 Minutes Intravenous Every 6 hours 11/26/15 0751 11/27/15 0940        Note: Portions of this report may have been transcribed using voice recognition software. Every effort was made to ensure  accuracy; however, inadvertent computerized transcription errors may be present.   Any transcriptional errors that result from this process are unintentional.    [name], M.D., F.A.C.S. Gastrointestinal and Minimally Invasive Surgery [facility] [address], [place], [place] [address] [phone] Main / Paging     12/04/2015

## 2015-12-05 ENCOUNTER — Inpatient Hospital Stay (HOSPITAL_COMMUNITY): Payer: Medicare Other

## 2015-12-05 DIAGNOSIS — J988 Other specified respiratory disorders: Secondary | ICD-10-CM

## 2015-12-05 LAB — GLUCOSE, CAPILLARY
GLUCOSE-CAPILLARY: 84 mg/dL (ref 65–99)
GLUCOSE-CAPILLARY: 91 mg/dL (ref 65–99)
GLUCOSE-CAPILLARY: 103 mg/dL — AB (ref 65–99)
Glucose-Capillary: 94 mg/dL (ref 65–99)
Glucose-Capillary: 97 mg/dL (ref 65–99)

## 2015-12-05 LAB — BASIC METABOLIC PANEL
ANION GAP: 7 (ref 5–15)
BUN: 50 mg/dL — ABNORMAL HIGH (ref 6–20)
CALCIUM: 7.1 mg/dL — AB (ref 8.9–10.3)
CHLORIDE: 106 mmol/L (ref 101–111)
CO2: 18 mmol/L — AB (ref 22–32)
Creatinine, Ser: 1.66 mg/dL — ABNORMAL HIGH (ref 0.61–1.24)
GFR calc Af Amer: 45 mL/min — ABNORMAL LOW (ref >60)
GFR calc non Af Amer: 39 mL/min — ABNORMAL LOW (ref >60)
GLUCOSE: 106 mg/dL — AB (ref 65–99)
POTASSIUM: 5.1 mmol/L (ref 3.5–5.1)
Sodium: 131 mmol/L — ABNORMAL LOW (ref 135–145)

## 2015-12-05 LAB — CBC
HCT: 34.1 % — ABNORMAL LOW (ref 39.0–52.0)
HEMATOCRIT: 37.6 % — AB (ref 39.0–52.0)
HEMOGLOBIN: 12.8 g/dL — AB (ref 13.0–17.0)
Hemoglobin: 11.6 g/dL — ABNORMAL LOW (ref 13.0–17.0)
MCH: 30.5 pg (ref 26.0–34.0)
MCH: 29.7 pg (ref 26.0–34.0)
MCHC: 34 g/dL (ref 30.0–36.0)
MCHC: 34 g/dL (ref 30.0–36.0)
MCV: 89.7 fL (ref 78.0–100.0)
MCV: 87.2 fL (ref 78.0–100.0)
PLATELETS: 179 10*3/uL (ref 150–400)
Platelets: 206 10*3/uL (ref 150–400)
RBC: 4.19 MIL/uL — ABNORMAL LOW (ref 4.22–5.81)
RBC: 3.91 MIL/uL — AB (ref 4.22–5.81)
RDW: 13.5 % (ref 11.5–15.5)
RDW: 13.7 % (ref 11.5–15.5)
WBC: 19.6 10*3/uL — AB (ref 4.0–10.5)
WBC: 20.2 10*3/uL — AB (ref 4.0–10.5)

## 2015-12-05 LAB — HEPARIN LEVEL (UNFRACTIONATED)
HEPARIN UNFRACTIONATED: 0.6 [IU]/mL (ref 0.30–0.70)
Heparin Unfractionated: 0.66 IU/mL (ref 0.30–0.70)

## 2015-12-05 NOTE — Progress Notes (Addendum)
ANTICOAGULATION CONSULT NOTE -   Pharmacy Consult for heparin IV Indication: RUE thrombus  Allergies  Allergen Reactions  . Flomax [Tamsulosin Hcl] Other (See Comments)    Leg weakness  . Lisinopril Cough  . Lorazepam     "i don't like it"  Prefers Xanax or valium  . Uroxatral [Alfuzosin Hcl Er] Other (See Comments)    Leg weakness    Patient Measurements: Height: 6\' 2"  (188 cm) Weight: 226 lb 6.6 oz (102.7 kg) IBW/kg (Calculated) : 82.2 Heparin Dosing Weight: 95 kg (TBW)  Vital Signs: Temp: 98.7 F (37.1 C) (10/21 1600) Temp Source: Axillary (10/21 1600) BP: 147/43 (10/21 1630) Pulse Rate: 63 (10/21 1630)  Labs:  Recent Labs  12/03/15 0512 12/04/15 0600 12/04/15 0930 12/04/15 2020 12/05/15 0514 12/05/15 1310 12/05/15 1540  HGB 17.0  --  12.4*  --  12.8* 11.6*  --   HCT 50.6  --  35.9*  --  37.6* 34.1*  --   PLT 349  --  291  --  206 179  --   APTT  --   --   --  39*  --   --   --   LABPROT  --   --   --  19.4*  --   --   --   INR  --   --   --  1.62  --   --   --   HEPARINUNFRC  --   --   --   --  0.66  --  0.60  CREATININE 1.22 1.85*  --   --  1.66*  --   --     Estimated Creatinine Clearance: 49.2 mL/min (by C-G formula based on SCr of 1.66 mg/dL (H)).   Medical History: Past Medical History:  Diagnosis Date  . Bilateral dry eyes   . Coronary artery disease 1997   bare mental stent right coronart artery  . Degenerative disc disease    LOWER BACK  . Dysphonia   . ED (erectile dysfunction)   . Gastroesophageal reflux disease   . Hyperlipidemia   . Hypertension   . Peptic ulcer   . Pneumonia    HISTORY OF IN CHILDHOOD  . Posterior vitreous detachment 10/30/2012  . Urinary hesitancy   . Wears glasses     Medications:  Scheduled:  . anidulafungin  100 mg Intravenous Q24H  . bisacodyl  10 mg Rectal Daily  . chlorhexidine  15 mL Mouth Rinse BID  . cycloSPORINE  1 drop Both Eyes BID  . insulin aspart  0-9 Units Subcutaneous Q4H  . mouth rinse   15 mL Mouth Rinse q12n4p  . pantoprazole (PROTONIX) IV  40 mg Intravenous Q12H  . piperacillin-tazobactam (ZOSYN)  IV  3.375 g Intravenous Q8H  . polyvinyl alcohol  2 drop Both Eyes BID   Infusions:  . sodium chloride 100 mL/hr at 12/04/15 1840  . dexmedetomidine 1 mcg/kg/hr (12/05/15 1324)  . heparin 1,400 Units/hr (12/05/15 1211)  . norepinephrine (LEVOPHED) Adult infusion 9 mcg/min (12/05/15 1609)    Assessment: 75 yo M with placement of gastrostomy and feeding jejunostomy tubes, s/p recent laparoscopy with washout and omentopexy repair on 10/19, now with distal brachial thrombus likely related to A-line placed there; fingers purple with unpalpable pulse. Per surgery, ok to start heparin for thrombus.  Significant events: 10/20: brachial A-line removed  Today, 12/05/2015:  CBC: Hgb sl low but stable; PLTC down this AM, slight drop in both  Confirmatory level is 0.60,  therapeutic  No bleeding or infusion issues noted  Goal of Therapy: Heparin level 0.3-0.7 units/ml Monitor platelets by anticoagulation protocol: Yes  Plan:  Continue Heparin 1400 units/hr IV infusion  Closely watch Hgb, PLTC for further significant decrease  Order instructions on daily CBC order states to dc heparin if hgb drops below 8, may need to clarify if hgb drops  Daily CBC, daily heparin level   Monitor for signs of bleeding or thrombosis   Adalberto ColeNikola Airianna Kreischer, PharmD, BCPS Pager [phone] 12/05/2015 4:59 PM

## 2015-12-05 NOTE — Progress Notes (Signed)
S: No acute events overnight. Levo down to 12 this morning from 24.   Vitals, labs, intake/output, and orders reviewed at this time. Rising WBC. Afebrile.  Gen: Intubated/sedated Chest: PRVC , RRR Abd: soft, nondistended, drains with serous output. J tube to gravity- bilious. G tube to LIWS- bilious Ext: warm Neuro: sedated, delirious when sedation weaned   A/P:  S/p lap washout, omentopexy to perforated jejunum, replacement of GT and drain placement for peritonitis on 10/19 s/p EGD/dilation/GT and JT placement 2 days before.  Neuro: seems comfortable currently. Delirium- recommend transitioning away from benzos and utilizing haldol/zyprexa/seroquel instead. Cv: pressors weaning. Now on heparin drip for R brachial thrombus with suspected embolic event to digit. Dr. Myra GianottiBrabham following.  Gi: continue all drains and tubes as they are. No plans to change anything this weekend. No enteral meds.  FEN: plan for Tna when off pressors Id: continue zosyn. Pan-culture and broaden abx coverage if febrile    Danny Blakeshelsea Shina Wass, MD [facility] [place] Pager [phone]

## 2015-12-05 NOTE — Progress Notes (Signed)
ANTICOAGULATION CONSULT NOTE -   Pharmacy Consult for heparin IV Indication: RUE thrombus  Allergies  Allergen Reactions  . Flomax [Tamsulosin Hcl] Other (See Comments)    Leg weakness  . Lisinopril Cough  . Lorazepam     "i don't like it"  Prefers Xanax or valium  . Uroxatral [Alfuzosin Hcl Er] Other (See Comments)    Leg weakness    Patient Measurements: Height: 6\' 2"  (188 cm) Weight: 226 lb 6.6 oz (102.7 kg) IBW/kg (Calculated) : 82.2 Heparin Dosing Weight: 95 kg (TBW)  Vital Signs: Temp: 99.1 F (37.3 C) (10/21 0400) Temp Source: Oral (10/21 0400) BP: 119/40 (10/21 0445) Pulse Rate: 69 (10/21 0445)  Labs:  Recent Labs  12/03/15 0512 12/04/15 0600 12/04/15 0930 12/04/15 2020 12/05/15 0514  HGB 17.0  --  12.4*  --  12.8*  HCT 50.6  --  35.9*  --  37.6*  PLT 349  --  291  --  206  APTT  --   --   --  39*  --   LABPROT  --   --   --  19.4*  --   INR  --   --   --  1.62  --   HEPARINUNFRC  --   --   --   --  0.66  CREATININE 1.22 1.85*  --   --  1.66*    Estimated Creatinine Clearance: 49.2 mL/min (by C-G formula based on SCr of 1.66 mg/dL (H)).   Medical History: Past Medical History:  Diagnosis Date  . Bilateral dry eyes   . Coronary artery disease 1997   bare mental stent right coronart artery  . Degenerative disc disease    LOWER BACK  . Dysphonia   . ED (erectile dysfunction)   . Gastroesophageal reflux disease   . Hyperlipidemia   . Hypertension   . Peptic ulcer   . Pneumonia    HISTORY OF IN CHILDHOOD  . Posterior vitreous detachment 10/30/2012  . Urinary hesitancy   . Wears glasses     Medications:  Scheduled:  . anidulafungin  100 mg Intravenous Q24H  . bisacodyl  10 mg Rectal Daily  . chlorhexidine  15 mL Mouth Rinse BID  . cycloSPORINE  1 drop Both Eyes BID  . insulin aspart  0-9 Units Subcutaneous Q4H  . mouth rinse  15 mL Mouth Rinse q12n4p  . pantoprazole (PROTONIX) IV  40 mg Intravenous Q12H  . piperacillin-tazobactam  (ZOSYN)  IV  3.375 g Intravenous Q8H  . polyvinyl alcohol  2 drop Both Eyes BID   Infusions:  . sodium chloride 100 mL/hr at 12/04/15 1840  . dexmedetomidine 1 mcg/kg/hr (12/05/15 0127)  . heparin 1,400 Units/hr (12/04/15 2014)  . norepinephrine (LEVOPHED) Adult infusion 13 mcg/min (12/05/15 0231)    Assessment: 75 yo M with placement of gastrostomy and feeding jejunostomy tubes, s/p recent laparoscopy with washout and omentopexy repair on 10/19, now with distal brachial thrombus likely related to A-line placed there; fingers purple with unpalpable pulse. Per surgery, ok to start heparin for thrombus.  Significant events: 10/20: brachial A-line removed  Today, 12/05/2015:  CBC: Hgb sl low but stable; PLTC down this AM   No bleeding or infusion issues noted  Goal of Therapy: Heparin level 0.3-0.7 units/ml Monitor platelets by anticoagulation protocol: Yes  Plan:  Continue Heparin 1400 units/hr IV infusion  Re heck heparin level in 8 hrs to confirm therapeutic dose  Closely watch PLTC for further significant decrease  Daily  CBC, daily heparin level once stable  Monitor for signs of bleeding or thrombosis   Terrilee FilesLeann Meranda Dechaine, PharmD 12/05/2015, 5:59 AM

## 2015-12-05 NOTE — Progress Notes (Signed)
PCCM PROGRESS NOTE  Admission date: 11/24/2015 Consult date: 12/03/2015 Referring provider: Dr. Michaell CowingGross  CC: Abdominal pain  Subjective: Remains on pressors.  Tolerating some pressure support.  Vital signs: BP (!) 123/29   Pulse 66   Temp 98 F (36.7 C) (Oral)   Resp 20   Ht 6\' 2"  (1.88 m)   Wt 226 lb 6.6 oz (102.7 kg)   SpO2 99%   BMI 29.07 kg/m   Intake/output: I/O last 3 completed shifts: In: 13147.2 [I.V.:9562.2; Other:125; IV Piggyback:3460] Out: 16104022 [Urine:1314; Drains:2658; Blood:50]   Vent settings: Vent Mode: Other (Comment) FiO2 (%):  [30 %-40 %] 30 % Set Rate:  [20 bmp] 20 bmp Vt Set:  [960[620 mL] 620 mL PEEP:  [5 cmH20] 5 cmH20 Pressure Support:  [8 cmH20] 8 cmH20 Plateau Pressure:  [14 cmH20-16 cmH20] 14 cmH20  General: sedated Neuro: RASS -2 HEENT: ETT in place Cardiac: regular, no murmur Chest: no wheeze Abd: soft, non tender Ext: 1+ edema Skin: ischemic changes to Rt index finger  CMP Latest Ref Rng & Units 12/05/2015 12/04/2015 12/04/2015  Glucose 65 - 99 mg/dL 454(U106(H) - -  BUN 6 - 20 mg/dL 98(J50(H) - -  Creatinine 1.910.61 - 1.24 mg/dL 4.78(G1.66(H) - -  Sodium 956135 - 145 mmol/L 131(L) - -  Potassium 3.5 - 5.1 mmol/L 5.1 5.6(H) -  Chloride 101 - 111 mmol/L 106 - -  CO2 22 - 32 mmol/L 18(L) - -  Calcium 8.9 - 10.3 mg/dL 7.1(L) - -  Total Protein 6.5 - 8.1 g/dL - - 4.0(L)  Total Bilirubin 0.3 - 1.2 mg/dL - - 0.7  Alkaline Phos 38 - 126 U/L - - 51  AST 15 - 41 U/L - - 35  ALT 17 - 63 U/L - - 30    CBC Latest Ref Rng & Units 12/05/2015 12/04/2015 12/03/2015  WBC 4.0 - 10.5 K/uL 19.6(H) 16.3(H) 12.5(H)  Hemoglobin 13.0 - 17.0 g/dL 12.8(L) 12.4(L) 17.0  Hematocrit 39.0 - 52.0 % 37.6(L) 35.9(L) 50.6  Platelets 150 - 400 K/uL 206 291 349    ABG    Component Value Date/Time   PHART 7.423 12/04/2015 0840   PCO2ART 28.4 (L) 12/04/2015 0840   PO2ART 80.8 (L) 12/04/2015 0840   HCO3 18.2 (L) 12/04/2015 0840   TCO2 26 12/04/2007 0638   ACIDBASEDEF 4.7 (H)  12/04/2015 0840   O2SAT 96.7 12/04/2015 0840    CBG (last 3)   Recent Labs  12/04/15 2331 12/05/15 0407 12/05/15 0748  GLUCAP 102* 94 84     Imaging: Dg Chest Port 1 View  Result Date: 12/05/2015 CLINICAL DATA:  Acute respiratory failure EXAM: PORTABLE CHEST 1 VIEW COMPARISON:  12/03/2015 FINDINGS: Endotracheal tube and left subclavian central venous catheter are again noted and stable. Cardiac shadow is stable. The lungs are well-aerated without focal infiltrate. Minimal left basilar atelectasis is seen. No bony abnormality is seen. IMPRESSION: Stable left basilar atelectasis. Tubes and lines as described. Electronically Signed   By: Alcide CleverMark  Lukens M.D.   On: 12/05/2015 07:17   Dg Chest Port 1 View  Result Date: 12/04/2015 CLINICAL DATA:  75 year old male status post endotracheal tube placement. EXAM: PORTABLE CHEST 1 VIEW COMPARISON:  Chest radiograph dated 11/29/2015 FINDINGS: Endotracheal tube the tip approximately 5 cm above the carina. Left subclavian central venous line with tip over central SVC. Left lung base linear atelectasis versus less likely infiltrate. The right lung is clear. No pleural effusion or pneumothorax. The cardiac silhouette is within normal limits.  No acute osseous pathology. IMPRESSION: Interval placement of an endotracheal and left subclavian central line with tips in appropriate positioning. No pneumothorax. Electronically Signed   By: Elgie Collard M.D.   On: 12/04/2015 04:02   Studies: 10/11 CT abdomen/pelvis> significant distension of the stomach with layering fluid and gas, NG present, worrisome for recurrent gastric outlet obstruction.  RLL consolidation, cholecycstectomy  Cultures: Blood 10/19 >>  Antibiotics: Zosyn 10/19 >> Anidulafungin 10/19 >>   Events: 10/10  admission 10/17  Lap placement of J & G tubes, EGD w/ balloon dilation of duodenal stricture, gastric biopsies, closure of gastrotomy 10/18  sundowning, pulled out G tube 10/19   laparaoscopy (findings gastric leak, bilious ascites, jejunal disruption), omentoplexy of jejunal disruption, gastric leak  Lines/tube: 10/19 ETT >> 10/19 CVL left subclavian >>  10/19 R brachial arterial line >> 10/20 10/19 abdominal drain x3 >> 10/19 G tube >> 10/19 J tube >>  Summary: 75 yo male former smoker with progressive epigastric pain, nausea, vomiting, bloating from gastric outlet obstruction with pyloric channel ulcer.  Had Billroth 1, vagotomy, fundoplication, omentopexy August 2017 with initial improvement.  Had recurrent symptoms September 2017 that become progressively worse with gastric dilation from recurrent stricture.  Had laparoscopic placement of jejunostomy tube, gastrostomy tube, and EGD balloon dilation of duodenal stricture 10/17.  Developed gastric leakage with peritonitis 10/19 and taken to OR.  PMHx of CAD s/p stent, GERD, HTN, HLD  Assessment/plan:  Acute respiratory failure. - pressure support wean - f/u CXR  Septic shock 2nd to peritonitis. Hx of CAD, HTN, HLD. - wean pressors to keep MAP > 65 - continue IV fluids - hold outpt asa, lipitor, diovan  Recurrent gastric outlet obstruction. Peritonitis. Nutrition. - post-op care, nutrition per CCS - Day 3 of zosyn, anidulafungin  AKI. - optimize hemodynamics - monitor renal function, urine outpt  Acute metabolic encephalopathy. - RASS goal 0 to -1  Rt index finger ischemia likely from Rt brachial a line. - VVS consulted 10/20 - IV heparin   SUP - Protonix DVT prophylaxis - heparin gtt, SCDs Goals of care - Full code  Updated family at bedside.  CC time 32 minutes.  Coralyn Helling, MD [facility] Pulmonary/Critical Care 12/05/2015, 11:32 AM Pager:  [phone] After 3pm call: [phone]

## 2015-12-06 LAB — GLUCOSE, CAPILLARY
GLUCOSE-CAPILLARY: 101 mg/dL — AB (ref 65–99)
GLUCOSE-CAPILLARY: 104 mg/dL — AB (ref 65–99)
GLUCOSE-CAPILLARY: 107 mg/dL — AB (ref 65–99)
Glucose-Capillary: 106 mg/dL — ABNORMAL HIGH (ref 65–99)
Glucose-Capillary: 111 mg/dL — ABNORMAL HIGH (ref 65–99)
Glucose-Capillary: 112 mg/dL — ABNORMAL HIGH (ref 65–99)

## 2015-12-06 LAB — BASIC METABOLIC PANEL
ANION GAP: 7 (ref 5–15)
BUN: 49 mg/dL — ABNORMAL HIGH (ref 6–20)
CALCIUM: 7 mg/dL — AB (ref 8.9–10.3)
CO2: 19 mmol/L — ABNORMAL LOW (ref 22–32)
Chloride: 109 mmol/L (ref 101–111)
Creatinine, Ser: 1.14 mg/dL (ref 0.61–1.24)
GLUCOSE: 116 mg/dL — AB (ref 65–99)
Potassium: 4 mmol/L (ref 3.5–5.1)
Sodium: 135 mmol/L (ref 135–145)

## 2015-12-06 LAB — CBC
HCT: 30.7 % — ABNORMAL LOW (ref 39.0–52.0)
Hemoglobin: 10.7 g/dL — ABNORMAL LOW (ref 13.0–17.0)
MCH: 30.6 pg (ref 26.0–34.0)
MCHC: 34.9 g/dL (ref 30.0–36.0)
MCV: 87.7 fL (ref 78.0–100.0)
PLATELETS: 199 10*3/uL (ref 150–400)
RBC: 3.5 MIL/uL — ABNORMAL LOW (ref 4.22–5.81)
RDW: 13.9 % (ref 11.5–15.5)
WBC: 21.2 10*3/uL — AB (ref 4.0–10.5)

## 2015-12-06 LAB — HEPARIN LEVEL (UNFRACTIONATED): Heparin Unfractionated: 0.67 IU/mL (ref 0.30–0.70)

## 2015-12-06 LAB — CORTISOL: CORTISOL PLASMA: 19.4 ug/dL

## 2015-12-06 MED ORDER — HEPARIN (PORCINE) IN NACL 100-0.45 UNIT/ML-% IJ SOLN
1300.0000 [IU]/h | INTRAMUSCULAR | Status: DC
  Administered 2015-12-06 – 2015-12-09 (×5): 1300 [IU]/h via INTRAVENOUS
  Filled 2015-12-06 (×6): qty 250

## 2015-12-06 NOTE — Progress Notes (Signed)
Pharmacy Antibiotic Follow-up Note  [name] is a 75 y.o. year-old male admitted on 11/24/2015.  The patient is currently on day 1 of Zosyn for intra-abdominal infection.  Day #3 antibiotics  WBC remains elevated  SCr has improved to WNL  Started on eraxis   afebrile  Assessment/Plan:  Zosyn 3.375g IV q8h (4 hour infusion).   Pharmacy will sign-off for note writing at this time as do not anticipate need for dose adjustment with improved renal fx  F/u ability to d/c antifungal, eraxis, when clinically appropriate  Follow length of zosyn therapy - Dr Michaell CowingGross note recommends a minimum of 5-days  Temp (24hrs), Avg:98.6 F (37 C), Min:97.4 F (36.3 C), Max:99.8 F (37.7 C)   Recent Labs Lab 12/03/15 0512 12/04/15 0930 12/05/15 0514 12/05/15 1310 12/06/15 0500  WBC 12.5* 16.3* 19.6* 20.2* 21.2*     Recent Labs Lab 12/01/15 0509 12/03/15 0512 12/04/15 0600 12/05/15 0514 12/06/15 0500  CREATININE 0.70 1.22 1.85* 1.66* 1.14   Estimated Creatinine Clearance: 70.5 mL/min (by C-G formula based on SCr of 1.14 mg/dL).    Allergies  Allergen Reactions  . Flomax [Tamsulosin Hcl] Other (See Comments)    Leg weakness  . Lisinopril Cough  . Lorazepam     "i don't like it"  Prefers Xanax or valium  . Uroxatral [Alfuzosin Hcl Er] Other (See Comments)    Leg weakness    Antimicrobials this admission: 10/20 Zosyn >>  10/19 Cefotetan x1  Levels/dose changes this admission:  Microbiology results: 10/20 BCx: NGTD 10/19 MRSA PCR neg  Thank you for allowing pharmacy to be a part of this patient's care.  Juliette Alcideustin Zeigler, PharmD, BCPS.   Pager: [phone] 12/06/2015 9:25 AM

## 2015-12-06 NOTE — Progress Notes (Signed)
ANTICOAGULATION CONSULT NOTE - Follow-up  Pharmacy Consult for heparin IV Indication: RUE thrombus  Allergies  Allergen Reactions  . Flomax [Tamsulosin Hcl] Other (See Comments)    Leg weakness  . Lisinopril Cough  . Lorazepam     "i don't like it"  Prefers Xanax or valium  . Uroxatral [Alfuzosin Hcl Er] Other (See Comments)    Leg weakness    Patient Measurements: Height: 6\' 2"  (188 cm) Weight: 218 lb 11.1 oz (99.2 kg) IBW/kg (Calculated) : 82.2 Heparin Dosing Weight: 95 kg (TBW)  Vital Signs: Temp: 97.8 F (36.6 C) (10/22 0345) Temp Source: Axillary (10/22 0345) BP: 124/28 (10/22 0645) Pulse Rate: 36 (10/22 0645)  Labs:  Recent Labs  12/04/15 0600  12/04/15 2020 12/05/15 0514 12/05/15 1310 12/05/15 1540 12/06/15 0500  HGB  --   < >  --  12.8* 11.6*  --  10.7*  HCT  --   < >  --  37.6* 34.1*  --  30.7*  PLT  --   < >  --  206 179  --  199  APTT  --   --  39*  --   --   --   --   LABPROT  --   --  19.4*  --   --   --   --   INR  --   --  1.62  --   --   --   --   HEPARINUNFRC  --   --   --  0.66  --  0.60 0.67  CREATININE 1.85*  --   --  1.66*  --   --  1.14  < > = values in this interval not displayed.  Estimated Creatinine Clearance: 70.5 mL/min (by C-G formula based on SCr of 1.14 mg/dL).   Assessment: 75 yo M with placement of gastrostomy and feeding jejunostomy tubes, s/p recent laparoscopy with washout and omentopexy repair on 10/19, now with distal brachial thrombus likely related to A-line placed there; fingers purple with unpalpable pulse. Per surgery, ok to start heparin for thrombus.  Significant events: 10/20: brachial A-line removed 10/21: having to collect heparin level from central line due to inability to get peripheral (including foot stick) collection  Today, 12/06/2015:  Heparin level therapeutic (at upper end therapeutic range) at current rate of 1400 units/hr  CBC: Hgb appears to be trending dowin; PLTC WNL  No bleeding or infusion  issues noted  Goal of Therapy: Heparin level 0.3-0.7 units/ml, prefer 0.3-0.5 due to recent surgery Monitor platelets by anticoagulation protocol: Yes  Plan:  Due to recent surgery and heparin level at upper end goal, reduce Heparin to 1300 units/hr IV infusion  Closely watch Hgb for further significant decrease  Order instructions on daily CBC order states to dc heparin if hgb drops below 8 (looks to be from Dr  Michaell CowingGross standard post-op orders), may need to clarify if hgb drops  Daily CBC, daily heparin level   Monitor for signs of bleeding or thrombosis   Juliette Alcideustin Zeigler, PharmD, BCPS.   Pager: [phone] 12/06/2015 8:54 AM

## 2015-12-06 NOTE — Progress Notes (Signed)
PCCM PROGRESS NOTE  Admission date: 11/24/2015 Consult date: 12/03/2015 Referring provider: Dr. Michaell Cowing  CC: Abdominal pain  Subjective: Tolerating pressure support 8/5.  Low dose pressors.  Vital signs: BP (!) 109/55   Pulse (!) 57   Temp 97.4 F (36.3 C) (Oral)   Resp (!) 21   Ht 6\' 2"  (1.88 m)   Wt 218 lb 11.1 oz (99.2 kg)   SpO2 100%   BMI 28.08 kg/m   Intake/output: I/O last 3 completed shifts: In: 5847.2 [I.V.:5427.2; Other:90; IV Piggyback:330] Out: 4705 [Urine:2350; Drains:2355]   Vent settings: Vent Mode: PRVC FiO2 (%):  [30 %] 30 % Set Rate:  [20 bmp] 20 bmp Vt Set:  [409 mL] 620 mL PEEP:  [5 cmH20] 5 cmH20 Pressure Support:  [5 cmH20-8 cmH20] 5 cmH20 Plateau Pressure:  [14 cmH20-18 cmH20] 14 cmH20  General: sedated Neuro: RASS -1 HEENT: ETT in place Cardiac: regular, no murmur Chest: no wheeze Abd: soft, non tender Ext: 1+ edema Skin: ischemic changes to Rt index finger   CMP Latest Ref Rng & Units 12/06/2015 12/05/2015 12/04/2015  Glucose 65 - 99 mg/dL 811(B) 147(W) -  BUN 6 - 20 mg/dL 29(F) 62(Z) -  Creatinine 0.61 - 1.24 mg/dL 3.08 6.57(Q) -  Sodium 135 - 145 mmol/L 135 131(L) -  Potassium 3.5 - 5.1 mmol/L 4.0 5.1 5.6(H)  Chloride 101 - 111 mmol/L 109 106 -  CO2 22 - 32 mmol/L 19(L) 18(L) -  Calcium 8.9 - 10.3 mg/dL 7.0(L) 7.1(L) -  Total Protein 6.5 - 8.1 g/dL - - -  Total Bilirubin 0.3 - 1.2 mg/dL - - -  Alkaline Phos 38 - 126 U/L - - -  AST 15 - 41 U/L - - -  ALT 17 - 63 U/L - - -    CBC Latest Ref Rng & Units 12/06/2015 12/05/2015 12/05/2015  WBC 4.0 - 10.5 K/uL 21.2(H) 20.2(H) 19.6(H)  Hemoglobin 13.0 - 17.0 g/dL 10.7(L) 11.6(L) 12.8(L)  Hematocrit 39.0 - 52.0 % 30.7(L) 34.1(L) 37.6(L)  Platelets 150 - 400 K/uL 199 179 206    ABG    Component Value Date/Time   PHART 7.423 12/04/2015 0840   PCO2ART 28.4 (L) 12/04/2015 0840   PO2ART 80.8 (L) 12/04/2015 0840   HCO3 18.2 (L) 12/04/2015 0840   TCO2 26 12/04/2007 0638   ACIDBASEDEF 4.7 (H) 12/04/2015 0840   O2SAT 96.7 12/04/2015 0840    CBG (last 3)   Recent Labs  12/06/15 0028 12/06/15 0315 12/06/15 0756  GLUCAP 112* 107* 106*     Imaging: Dg Chest Port 1 View  Result Date: 12/05/2015 CLINICAL DATA:  Acute respiratory failure EXAM: PORTABLE CHEST 1 VIEW COMPARISON:  12/03/2015 FINDINGS: Endotracheal tube and left subclavian central venous catheter are again noted and stable. Cardiac shadow is stable. The lungs are well-aerated without focal infiltrate. Minimal left basilar atelectasis is seen. No bony abnormality is seen. IMPRESSION: Stable left basilar atelectasis. Tubes and lines as described. Electronically Signed   By: Alcide Clever M.D.   On: 12/05/2015 07:17   Studies: 10/11 CT abdomen/pelvis> significant distension of the stomach with layering fluid and gas, NG present, worrisome for recurrent gastric outlet obstruction.  RLL consolidation, cholecycstectomy  Cultures: Blood 10/19 >>  Antibiotics: Zosyn 10/19 >> Anidulafungin 10/19 >>   Events: 10/10  admission 10/17  Lap placement of J & G tubes, EGD w/ balloon dilation of duodenal stricture, gastric biopsies, closure of gastrotomy 10/18  sundowning, pulled out G tube 10/19  laparaoscopy (findings  gastric leak, bilious ascites, jejunal disruption), omentoplexy of jejunal disruption, gastric leak  Lines/tube: 10/19 ETT >> 10/19 CVL left subclavian >>  10/19 R brachial arterial line >> 10/20 10/19 abdominal drain x3 >> 10/19 G tube >> 10/19 J tube >>  Summary: 75 yo male former smoker with progressive epigastric pain, nausea, vomiting, bloating from gastric outlet obstruction with pyloric channel ulcer.  Had Billroth 1, vagotomy, fundoplication, omentopexy August 2017 with initial improvement.  Had recurrent symptoms September 2017 that become progressively worse with gastric dilation from recurrent stricture.  Had laparoscopic placement of jejunostomy tube, gastrostomy tube, and  EGD balloon dilation of duodenal stricture 10/17.  Developed gastric leakage with peritonitis 10/19 and taken to OR.  PMHx of CAD s/p stent, GERD, HTN, HLD  Assessment/plan:  Acute respiratory failure. - pressure support wean >> might be ready for extubation trial soon - f/u CXR  Septic shock 2nd to peritonitis. Hx of CAD, HTN, HLD. - wean pressors to keep MAP > 65 - continue IV fluids - check cortisol - hold outpt asa, lipitor, diovan  Recurrent gastric outlet obstruction. Peritonitis. Nutrition. - post-op care, nutrition per CCS - Day 4 of zosyn, anidulafungin  AKI >> improved. - optimize hemodynamics - monitor renal function, urine outpt  Acute metabolic encephalopathy. - RASS goal 0 to -1  Rt index finger ischemia likely from Rt brachial a line. - VVS consulted 10/20 - IV heparin   Hyperglycemia. - SSI  SUP - Protonix DVT prophylaxis - heparin gtt, SCDs Goals of care - Full code  Updated family at bedside.  CC time 31 minutes.  Coralyn HellingVineet Codylee Patil, MD [facility] Pulmonary/Critical Care 12/06/2015, 10:44 AM Pager:  [phone] After 3pm call: [phone]

## 2015-12-06 NOTE — Progress Notes (Signed)
S: No acute events.   Vitals, labs, intake/output, and orders reviewed at this time. WBC slowly rising. Afebrile. On zosyn and anidulafungin.  Gen: Intubated/sedated H&N: ETT in placed, atraumatic, neck supple, L [place] CVL Chest: Clear bilaterally, PRVC, RRR. Levo down to 9. Abd: soft, mildly distended, All incisions inspected no signs of infection. Jps with serous drainage. GT to low intermittent suction, bilious. J tube to gravity, bilious.  Ext: warm, no edema. Right index finger distal phalanx with light blue discoloration from embolic ischemia, no surrounding erythema or tissue breakdown at this time. Neuro: grossly normal  Lines/tubes/drains: PIV, ETT, CVL, Foley, JP x 3, J tube, G tube  A/P:  S/p lap washout, omentopexy to perforated jejunum, replacement of GT and drain placement for peritonitis on 10/19, s/p EGD/dilation/GT and JT placement 2 days before.  Neuro: seems comfortable currently. Delirium- recommend transitioning away from benzos and utilizing antipsychotics instead. Cv: pressors weaning. Now on heparin drip for R brachial thrombus with suspected embolic event to digit. Dr. Myra GianottiBrabham following.  Gi: continue all drains and tubes as they are. No plans to change anything this weekend. No enteral meds.  FEN: plan for Tna when off pressors Id: continue zosyn/anidulafungin. Pan-culture and could add vanc if febrile. Too early for CT scan- would wait until at least post op day 5.   Phylliss Blakeshelsea Connor, MD [facility], [place] Pager [phone]

## 2015-12-07 ENCOUNTER — Inpatient Hospital Stay (HOSPITAL_COMMUNITY): Payer: Medicare Other

## 2015-12-07 DIAGNOSIS — J9601 Acute respiratory failure with hypoxia: Secondary | ICD-10-CM

## 2015-12-07 DIAGNOSIS — J9600 Acute respiratory failure, unspecified whether with hypoxia or hypercapnia: Secondary | ICD-10-CM

## 2015-12-07 LAB — BLOOD GAS, ARTERIAL
ACID-BASE DEFICIT: 7.6 mmol/L — AB (ref 0.0–2.0)
Bicarbonate: 16.7 mmol/L — ABNORMAL LOW (ref 20.0–28.0)
DRAWN BY: 422461
FIO2: 60
MECHVT: 660 mL
O2 Saturation: 98.3 %
PEEP/CPAP: 5 cmH2O
PH ART: 7.344 — AB (ref 7.350–7.450)
PO2 ART: 132 mmHg — AB (ref 83.0–108.0)
Patient temperature: 98.2
RATE: 14 resp/min
pCO2 arterial: 31.4 mmHg — ABNORMAL LOW (ref 32.0–48.0)

## 2015-12-07 LAB — GLUCOSE, CAPILLARY
GLUCOSE-CAPILLARY: 103 mg/dL — AB (ref 65–99)
GLUCOSE-CAPILLARY: 95 mg/dL (ref 65–99)
GLUCOSE-CAPILLARY: 97 mg/dL (ref 65–99)
Glucose-Capillary: 105 mg/dL — ABNORMAL HIGH (ref 65–99)
Glucose-Capillary: 111 mg/dL — ABNORMAL HIGH (ref 65–99)
Glucose-Capillary: 97 mg/dL (ref 65–99)
Glucose-Capillary: 105 mg/dL — ABNORMAL HIGH (ref 65–99)

## 2015-12-07 LAB — PHOSPHORUS
Phosphorus: 2.7 mg/dL (ref 2.5–4.6)
Phosphorus: 3.2 mg/dL (ref 2.5–4.6)

## 2015-12-07 LAB — BASIC METABOLIC PANEL
ANION GAP: 6 (ref 5–15)
BUN: 46 mg/dL — ABNORMAL HIGH (ref 6–20)
CHLORIDE: 112 mmol/L — AB (ref 101–111)
CO2: 19 mmol/L — AB (ref 22–32)
CREATININE: 1.15 mg/dL (ref 0.61–1.24)
Calcium: 6.9 mg/dL — ABNORMAL LOW (ref 8.9–10.3)
GFR calc non Af Amer: >60 mL/min (ref >60)
GLUCOSE: 114 mg/dL — AB (ref 65–99)
Potassium: 3.7 mmol/L (ref 3.5–5.1)
Sodium: 137 mmol/L (ref 135–145)

## 2015-12-07 LAB — PREALBUMIN

## 2015-12-07 LAB — HEPARIN LEVEL (UNFRACTIONATED): Heparin Unfractionated: 0.55 IU/mL (ref 0.30–0.70)

## 2015-12-07 LAB — CBC
HEMATOCRIT: 29.1 % — AB (ref 39.0–52.0)
HEMOGLOBIN: 10.2 g/dL — AB (ref 13.0–17.0)
MCH: 30.4 pg (ref 26.0–34.0)
MCHC: 35.1 g/dL (ref 30.0–36.0)
MCV: 86.9 fL (ref 78.0–100.0)
Platelets: 184 10*3/uL (ref 150–400)
RBC: 3.35 MIL/uL — ABNORMAL LOW (ref 4.22–5.81)
RDW: 14.1 % (ref 11.5–15.5)
WBC: 15 10*3/uL — AB (ref 4.0–10.5)

## 2015-12-07 LAB — MAGNESIUM
MAGNESIUM: 2.1 mg/dL (ref 1.7–2.4)
MAGNESIUM: 2.2 mg/dL (ref 1.7–2.4)

## 2015-12-07 MED ORDER — FENTANYL CITRATE (PF) 100 MCG/2ML IJ SOLN
50.0000 ug | Freq: Once | INTRAMUSCULAR | Status: AC
  Administered 2015-12-07: 100 ug via INTRAVENOUS

## 2015-12-07 MED ORDER — SODIUM CHLORIDE 0.9 % IV SOLN
25.0000 ug/h | INTRAVENOUS | Status: DC
  Administered 2015-12-07: 50 ug/h via INTRAVENOUS
  Administered 2015-12-08: 250 ug/h via INTRAVENOUS
  Filled 2015-12-07 (×2): qty 50

## 2015-12-07 MED ORDER — CHLORHEXIDINE GLUCONATE 0.12% ORAL RINSE (MEDLINE KIT)
15.0000 mL | Freq: Two times a day (BID) | OROMUCOSAL | Status: DC
  Administered 2015-12-07 – 2015-12-30 (×43): 15 mL via OROMUCOSAL

## 2015-12-07 MED ORDER — FENTANYL BOLUS VIA INFUSION
25.0000 ug | INTRAVENOUS | Status: DC | PRN
  Filled 2015-12-07: qty 25

## 2015-12-07 MED ORDER — ORAL CARE MOUTH RINSE
15.0000 mL | Freq: Four times a day (QID) | OROMUCOSAL | Status: DC
  Administered 2015-12-07 – 2015-12-25 (×67): 15 mL via OROMUCOSAL

## 2015-12-07 MED ORDER — DEXMEDETOMIDINE HCL IN NACL 400 MCG/100ML IV SOLN
0.2000 ug/kg/h | INTRAVENOUS | Status: DC
  Administered 2015-12-07 (×2): 0.8 ug/kg/h via INTRAVENOUS
  Administered 2015-12-09: 0.5 ug/kg/h via INTRAVENOUS
  Filled 2015-12-07: qty 50
  Filled 2015-12-07 (×5): qty 100

## 2015-12-07 MED ORDER — VITAL AF 1.2 CAL PO LIQD
1000.0000 mL | ORAL | Status: DC
  Administered 2015-12-07: 1000 mL
  Filled 2015-12-07 (×3): qty 1000

## 2015-12-07 MED ORDER — HYDROCORTISONE NA SUCCINATE PF 100 MG IJ SOLR: 50.0000 mg | Freq: Four times a day (QID) | INTRAMUSCULAR | Status: DC

## 2015-12-07 MED ORDER — IOPAMIDOL (ISOVUE-300) INJECTION 61%
50.0000 mL | Freq: Once | INTRAVENOUS | Status: AC | PRN
  Administered 2015-12-07: 50 mL via ORAL

## 2015-12-07 MED ORDER — VITAL AF 1.2 CAL PO LIQD: 1000.0000 mL | ORAL | Status: DC

## 2015-12-07 MED ORDER — VITAL HIGH PROTEIN PO LIQD
1000.0000 mL | ORAL | Status: DC
  Filled 2015-12-07: qty 1000

## 2015-12-07 MED ORDER — ETOMIDATE 2 MG/ML IV SOLN
40.0000 mg | Freq: Once | INTRAVENOUS | Status: AC
  Administered 2015-12-07: 40 mg via INTRAVENOUS

## 2015-12-07 MED ORDER — PRO-STAT SUGAR FREE PO LIQD: 30.0000 mL | Freq: Two times a day (BID) | ORAL | Status: DC

## 2015-12-07 NOTE — Progress Notes (Addendum)
[place] [place]  [address] [place], [place] [address] Phone: [phone] FAX: [phone]   [name] [id number] 02-09-1941  CARE TEAM:  PCP: Irven Shelling, MD  Outpatient Care Team: Patient Care Team: Lavone Orn, MD as PCP - General (Internal Medicine) Michael Boston, MD as Consulting Physician (General Surgery) Arta Silence, MD as Consulting Physician (Gastroenterology)  Inpatient Treatment Team: Treatment Team: Attending Provider: Michael Boston, MD; Consulting Physician: Arta Silence, MD; Technician: Sueanne Margarita, NT; Technician: Leda Quail, NT; Speech Language Pathologist: Narda Rutherford, CCC-SLP; Consulting Physician: Md Pccm, MD; Rounding Team: Md Pccm, MD; Consulting Physician: Serafina Mitchell, MD; Registered Nurse: Jenita Seashore, RN; Registered Nurse: Sheron Nightingale, RN; Technician: Linton Rump, NT  SURGERY 12/03/2015  POST-OPERATIVE DIAGNOSIS:    Peritonitis Jejunal disruption Gastric leak  PROCEDURE:    LAPAROSCOPY DIAGNOSTIC OMENTOPEXY of jejunal disruption Central OUT x 21L with drains placement  SURGEON:  Michael Boston, MD   Post-Op 12/01/2015  POST-OPERATIVE DIAGNOSIS:    RECURRENT GASTRIC OUTLET OBSTRUCTION DUE to significant edema at gastroduodenal anastomosis. GASTRITIS Failure to thrive with malnutrition  PROCEDURE:   LAPAROSCOPIC PLACEMENT OF FEEDING JEJUNOSTOMY AND GASTROSTOMY TUBEs ESOPHAGOGASTRODUODENOSCOPY (EGD) BALLOON DILATION OF DUODENAL STRICTURE Gastric biopsies  Closure of gastrotomy  SURGEON:  Michael Boston, MD  Prior surgery 09/25/2015  POST-OPERATIVE DIAGNOSIS:  Partial gastric outlet obstruction due to chronic pyloric stricture  PROCEDURE:   XI ROBOTIC distal gastrectomy BILROTH I  ANASTOMOSIS  ANTERIOR AND POSTERIOR VAGOTOMY DOR (Anterior 845 degree) FUNDIPLICATION OMENTOPEXY UPPER ENDOSCOPY  SURGEON:  Michael Boston,  MD   Problem List:   Principal Problem:   Bile peritonitis due to gastric tube dislodgement Active Problems:   Essential hypertension, benign   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 09/25/2015   Hyperlipidemia   Anxiety state   Gastric outlet obstruction   Gastric Ileus    Protein-calorie malnutrition, moderate (HCC)   Encephalopathy acute   Endotracheally intubated   On mechanically assisted ventilation (HCC)   Protein-calorie malnutrition, severe    Assessment/Plan:  GUARDED s/p patching at jejunum & resecuring Gtube & massive washout  NO ENTERAL MEDS with leaks around G & J tubes.  IV meds only  -Gtube to LIWS for ileus & GD anastomotic stricture  - J tube (LLQ 16Fr MicKey) study fluoro today - if no leak, then start low rate TF's <63m/hr.  Once off pressors, then adv to goal.  Keep to gravity bag for now  -volume/pressors for shock - weaning  -wean vent as tolerated.  Anticipate diuresis once off pressors  -Zosyn IV x 5 days minimum.  WBC lower, shcok less, & drains much more serous are hopeful signs  -PPI, H2B for gastriris./  F/u Bx's for Hpylori neg  Diagnosis Stomach, biopsy - CHRONIC FOCALLY ACTIVE GASTRITIS. - THERE IS NO EVIDENCE OF HELICOBACTER PYLORI, DYSPLASIA OR MALIGNANCY. - SEE COMMENT. Microscopic Comment A Warthin-Starry stain is negative for the presence of Helicobacter pylori organisms. (JBK:gt, 12/04/15) JEnid CutterMD Pathologist, Electronic Signature (Case signed 12/04/2015) Specimen Ardelle Haliburton and Clinical Information Specimen(s) Obtained: Stomach, biopsy Specimen Clinical  -Since he pulled out his PICC line, hold off on replacement and restarting TNA until shock cleared AND if leaking at J tube study Monday  -Sedation/Anxiolysis - challenge with pt that goes from sleeping to pulling at lines/tubes.  mits for now.   Consider transitioning from Xanax and benzos and utilizing haldol/zyprexa/seroquel  Sundowning  precautions.     VTE  prophylaxis - SCDs  Disposition: He will deftly required skilled care facility with rehabilitation capacity upon discharge from the hospital.  I suspect it's going to take at least a week if not several before he'll be ready for that transition.  His daughter expressed understanding and appreciation   I discussed operative findings, updated the patient's status, discussed probable steps to recovery, and gave postoperative recommendations to the patient's daughter & ICU RN.  Recommendations were made.  Questions were answered.  They expressed understanding & appreciation.    [name], M.D., F.A.C.S. Gastrointestinal and Minimally Invasive Surgery [facility] [address], [place], [place] [address] [phone] Main / Paging    12/07/2015   Subjective:  No major events Min vent settings Lower Levophed ICU nurse just outside room  Daughter in room  Objective:  Vital signs:  Vitals:   12/07/15 0355 12/07/15 0400 12/07/15 0500 12/07/15 0600  BP:  (!) 112/34 (!) 126/36 (!) 142/40  Pulse:  (!) 50 (!) 55 (!) 51  Resp:  20 (!) 21 20  Temp: 97.7 F (36.5 C)     TempSrc: Axillary     SpO2:  100% 100% 100%  Weight:  99.2 kg (218 lb 11.1 oz)    Height:        Last BM Date: 11/30/15 (per chart )  Intake/Output   Yesterday:  10/22 0701 - 10/23 0700 In: 3686.8 [I.V.:3356.8; IV Piggyback:150] Out: 1970 [Urine:1075; Drains:895] This shift:  Total I/O In: 1685.9 [I.V.:1545.9; Other:90; IV Piggyback:50] Out: 980 [Urine:375; Drains:605]  Bowel function:  Flatus: No  BM:  No  Drain: Gtube thick dark bilious.  J tube minimal Blake drains all serous - no bile tinge    Physical Exam:  General: Pt sleeping No acute distress at best.  Follows commands per ICU RN Eyes: PERRL, normal EOM.  Sclera clear.  No icterus Neuro: CN II-XII intact w/o focal sensory/motor deficits. Lymph: No head/neck/groin  lymphadenopathy Psych:  No delerium/psychosis/paranoia HENT: Normocephalic, Mucus membranes moist.  No thrush. ETT in place Neck: Supple, No tracheal deviation Chest: CTAB. Chest wall pain w good excursion CV:  Pulses intact.  Regular rhythm MS: Normal AROM mjr joints.  No obvious deformity Abdomen:  G tube LUQ to gravity old bile - hooked to LIWS.  J tube in LLQ hooked to gravity bag Somewhat firm.  Moderately distended.  Tenderness at Tube sites and lower abdomen..  Peritonitis but less TTP GU:  Foley in place.  NEMG Ext:  SCDs BLE.  No mjr edema.  Right index finger distal pad & mail bed with mild cyanosis.  Good cap refill to nail bed  Skin: 2+ anasarca.  No other petechiae / purpura  Results:   Diagnosis Stomach, biopsy - CHRONIC FOCALLY ACTIVE GASTRITIS. - THERE IS NO EVIDENCE OF HELICOBACTER PYLORI, DYSPLASIA OR MALIGNANCY. - SEE COMMENT. Microscopic Comment A Warthin-Starry stain is negative for the presence of Helicobacter pylori organisms. (JBK:gt, 12/04/15) Enid Cutter MD Pathologist, Electronic Signature (Case signed 12/04/2015) Specimen Delano Scardino and Clinical Information Specimen(s) Obtained: Stomach, biopsy Specimen Clinical  Labs: Results for orders placed or performed during the hospital encounter of 11/24/15 (from the past 48 hour(s))  Glucose, capillary     Status: None   Collection Time: 12/05/15  7:48 AM  Result Value Ref Range   Glucose-Capillary 84 65 - 99 mg/dL   Comment 1 Notify RN    Comment 2 Document in Chart   Glucose, capillary  Status: None   Collection Time: 12/05/15 12:39 PM  Result Value Ref Range   Glucose-Capillary 91 65 - 99 mg/dL  CBC     Status: Abnormal   Collection Time: 12/05/15  1:10 PM  Result Value Ref Range   WBC 20.2 (H) 4.0 - 10.5 K/uL   RBC 3.91 (L) 4.22 - 5.81 MIL/uL   Hemoglobin 11.6 (L) 13.0 - 17.0 g/dL   HCT 34.1 (L) 39.0 - 52.0 %   MCV 87.2 78.0 - 100.0 fL   MCH 29.7 26.0 - 34.0 pg   MCHC 34.0 30.0 - 36.0 g/dL    RDW 13.7 11.5 - 15.5 %   Platelets 179 150 - 400 K/uL  Heparin level (unfractionated)     Status: None   Collection Time: 12/05/15  3:40 PM  Result Value Ref Range   Heparin Unfractionated 0.60 0.30 - 0.70 IU/mL    Comment:        IF HEPARIN RESULTS ARE BELOW EXPECTED VALUES, AND PATIENT DOSAGE HAS BEEN CONFIRMED, SUGGEST FOLLOW UP TESTING OF ANTITHROMBIN III LEVELS.   Glucose, capillary     Status: Abnormal   Collection Time: 12/05/15  4:19 PM  Result Value Ref Range   Glucose-Capillary 103 (H) 65 - 99 mg/dL  Glucose, capillary     Status: None   Collection Time: 12/05/15  9:02 PM  Result Value Ref Range   Glucose-Capillary 97 65 - 99 mg/dL  Glucose, capillary     Status: Abnormal   Collection Time: 12/06/15 12:28 AM  Result Value Ref Range   Glucose-Capillary 112 (H) 65 - 99 mg/dL  Glucose, capillary     Status: Abnormal   Collection Time: 12/06/15  3:15 AM  Result Value Ref Range   Glucose-Capillary 107 (H) 65 - 99 mg/dL  CBC     Status: Abnormal   Collection Time: 12/06/15  5:00 AM  Result Value Ref Range   WBC 21.2 (H) 4.0 - 10.5 K/uL   RBC 3.50 (L) 4.22 - 5.81 MIL/uL   Hemoglobin 10.7 (L) 13.0 - 17.0 g/dL   HCT 30.7 (L) 39.0 - 52.0 %   MCV 87.7 78.0 - 100.0 fL   MCH 30.6 26.0 - 34.0 pg   MCHC 34.9 30.0 - 36.0 g/dL   RDW 13.9 11.5 - 15.5 %   Platelets 199 150 - 400 K/uL  Basic metabolic panel     Status: Abnormal   Collection Time: 12/06/15  5:00 AM  Result Value Ref Range   Sodium 135 135 - 145 mmol/L   Potassium 4.0 3.5 - 5.1 mmol/L    Comment: DELTA CHECK NOTED   Chloride 109 101 - 111 mmol/L   CO2 19 (L) 22 - 32 mmol/L   Glucose, Bld 116 (H) 65 - 99 mg/dL   BUN 49 (H) 6 - 20 mg/dL   Creatinine, Ser 1.14 0.61 - 1.24 mg/dL   Calcium 7.0 (L) 8.9 - 10.3 mg/dL   GFR calc non Af Amer >60 >60 mL/min   GFR calc Af Amer >60 >60 mL/min    Comment: (NOTE) The eGFR has been calculated using the CKD EPI equation. This calculation has not been validated in all  clinical situations. eGFR's persistently <60 mL/min signify possible Chronic Kidney Disease.    Anion gap 7 5 - 15  Heparin level (unfractionated)     Status: None   Collection Time: 12/06/15  5:00 AM  Result Value Ref Range   Heparin Unfractionated 0.67 0.30 - 0.70 IU/mL  Comment:        IF HEPARIN RESULTS ARE BELOW EXPECTED VALUES, AND PATIENT DOSAGE HAS BEEN CONFIRMED, SUGGEST FOLLOW UP TESTING OF ANTITHROMBIN III LEVELS.   Glucose, capillary     Status: Abnormal   Collection Time: 12/06/15  7:56 AM  Result Value Ref Range   Glucose-Capillary 106 (H) 65 - 99 mg/dL   Comment 1 Notify RN    Comment 2 Document in Chart   Cortisol     Status: None   Collection Time: 12/06/15 10:46 AM  Result Value Ref Range   Cortisol, Plasma 19.4 ug/dL    Comment: (NOTE) AM    6.7 - 22.6 ug/dL PM   <10.0       ug/dL Performed at [facility]   Glucose, capillary     Status: Abnormal   Collection Time: 12/06/15 11:36 AM  Result Value Ref Range   Glucose-Capillary 111 (H) 65 - 99 mg/dL   Comment 1 Notify RN    Comment 2 Document in Chart   Glucose, capillary     Status: Abnormal   Collection Time: 12/06/15  3:25 PM  Result Value Ref Range   Glucose-Capillary 104 (H) 65 - 99 mg/dL   Comment 1 Notify RN    Comment 2 Document in Chart   Glucose, capillary     Status: Abnormal   Collection Time: 12/06/15  8:15 PM  Result Value Ref Range   Glucose-Capillary 105 (H) 65 - 99 mg/dL  Glucose, capillary     Status: Abnormal   Collection Time: 12/06/15 11:48 PM  Result Value Ref Range   Glucose-Capillary 101 (H) 65 - 99 mg/dL   Comment 1 Notify RN    Comment 2 Document in Chart   Glucose, capillary     Status: Abnormal   Collection Time: 12/07/15  3:53 AM  Result Value Ref Range   Glucose-Capillary 111 (H) 65 - 99 mg/dL   Comment 1 Notify RN    Comment 2 Document in Chart   Heparin level (unfractionated)     Status: None   Collection Time: 12/07/15  4:30 AM  Result Value Ref  Range   Heparin Unfractionated 0.55 0.30 - 0.70 IU/mL    Comment:        IF HEPARIN RESULTS ARE BELOW EXPECTED VALUES, AND PATIENT DOSAGE HAS BEEN CONFIRMED, SUGGEST FOLLOW UP TESTING OF ANTITHROMBIN III LEVELS.   CBC     Status: Abnormal   Collection Time: 12/07/15  4:30 AM  Result Value Ref Range   WBC 15.0 (H) 4.0 - 10.5 K/uL   RBC 3.35 (L) 4.22 - 5.81 MIL/uL   Hemoglobin 10.2 (L) 13.0 - 17.0 g/dL   HCT 29.1 (L) 39.0 - 52.0 %   MCV 86.9 78.0 - 100.0 fL   MCH 30.4 26.0 - 34.0 pg   MCHC 35.1 30.0 - 36.0 g/dL   RDW 14.1 11.5 - 15.5 %   Platelets 184 150 - 400 K/uL  Basic metabolic panel     Status: Abnormal   Collection Time: 12/07/15  4:30 AM  Result Value Ref Range   Sodium 137 135 - 145 mmol/L   Potassium 3.7 3.5 - 5.1 mmol/L   Chloride 112 (H) 101 - 111 mmol/L   CO2 19 (L) 22 - 32 mmol/L   Glucose, Bld 114 (H) 65 - 99 mg/dL   BUN 46 (H) 6 - 20 mg/dL   Creatinine, Ser 1.15 0.61 - 1.24 mg/dL   Calcium 6.9 (L) 8.9 -  10.3 mg/dL   GFR calc non Af Amer >60 >60 mL/min   GFR calc Af Amer >60 >60 mL/min    Comment: (NOTE) The eGFR has been calculated using the CKD EPI equation. This calculation has not been validated in all clinical situations. eGFR's persistently <60 mL/min signify possible Chronic Kidney Disease.    Anion gap 6 5 - 15    Imaging / Studies: No results found.  Medications / Allergies: per chart  Antibiotics: Anti-infectives    Start     Dose/Rate Route Frequency Ordered Stop   12/05/15 1200  anidulafungin (ERAXIS) 100 mg in sodium chloride 0.9 % 100 mL IVPB     100 mg over 90 Minutes Intravenous Every 24 hours 12/04/15 1045     12/04/15 1200  anidulafungin (ERAXIS) 200 mg in sodium chloride 0.9 % 200 mL IVPB     200 mg over 180 Minutes Intravenous  Once 12/04/15 1045 12/04/15 1725   12/04/15 0600  cefoTEtan (CEFOTAN) 2 g in dextrose 5 % 50 mL IVPB     2 g 100 mL/hr over 30 Minutes Intravenous On call to O.R. 12/03/15 1821 12/03/15 2007    12/04/15 0400  piperacillin-tazobactam (ZOSYN) IVPB 3.375 g     3.375 g 12.5 mL/hr over 240 Minutes Intravenous Every 8 hours 12/03/15 2229     12/02/15 0000  erythromycin ethylsuccinate (EES) 200 MG/5ML suspension 400 mg  Status:  Discontinued    Comments:  Administer via J tube (16Fr LLQ)   400 mg Oral Every 6 hours 12/01/15 2020 12/03/15 0748   11/27/15 1200  erythromycin 500 mg in sodium chloride 0.9 % 100 mL IVPB     500 mg 100 mL/hr over 60 Minutes Intravenous Every 6 hours 11/27/15 0940 11/29/15 0608   11/26/15 0830  erythromycin 250 mg in sodium chloride 0.9 % 100 mL IVPB  Status:  Discontinued     250 mg 100 mL/hr over 60 Minutes Intravenous Every 6 hours 11/26/15 0751 11/27/15 0940        Note: Portions of this report may have been transcribed using voice recognition software. Every effort was made to ensure accuracy; however, inadvertent computerized transcription errors may be present.   Any transcriptional errors that result from this process are unintentional.    [name], M.D., F.A.C.S. Gastrointestinal and Minimally Invasive Surgery [facility] [address], [place], [place] [address] [phone] Main / Paging     12/07/2015

## 2015-12-07 NOTE — Progress Notes (Signed)
Nutrition Follow-up  DOCUMENTATION CODES:   Severe malnutrition in context of acute illness/injury  INTERVENTION:  - Recommend initiation of Vital 1.2 @ 20 mL/hr which will provide 576 kcal, 36 grams of protein, and 389 mL free water.  - Goal for TF: Vital 1.2 @ 70 mL/hr to provide 2013 kcal (95% estimated kcal need),126 grams of protein, and 1362 mL free water.  - RD will follow-up 10/24.  NUTRITION DIAGNOSIS:   Inadequate oral intake related to inability to eat as evidenced by NPO status. -ongoing  GOAL:   Patient will meet greater than or equal to 90% of their needs -unable to meet with TF not yet initiated.  MONITOR:   Vent status, Weight trends, Labs, Skin, I & O's, Other (Comment) (initiation of nutrition support)  ASSESSMENT:   75 year old male presenting for a post-operative visit. Note for "Post-Operative": Patient returns one month status post robotic resection.  Distal gastrectomy with Billroth I gastroduodenal reconstruction for chronic ulcer causing obstruction.  09/25/2015  10/23 Pt with OGT. Pt remains intubated. CBW consistent with weight from Friday and CBW of 99.2 kg used to re-estimate nutrition needs this AM. Per Surgery note this AM, plan for Fluoroscopy test today and if no leak, initiate TF at <30 mL/hr and can advance to goal rate after pressors weaned off. Plan to wean pressors to keep MAP >65. TF recommendations outlined above.  Patient is currently intubated on ventilator support MV: 13.1 L/min Temp (24hrs), Avg:98.2 F (36.8 C), Min:97.7 F (36.5 C), Max:98.7 F (37.1 C) BP: 114/49 and MAP: 72   Medications reviewed; 10 mg Dulcolax/day, sliding scale Novolog, PRN IV Zofran, 40 mg IV Protonix BID.  Labs reviewed; CBGs: 103 and 111 mg/dL, Cl: 161 mmol/L, BUN: 46 mg/dL, Ca: 6.9 mg/dL.  IVF: NS @ 100 mL/hr.  Drips: Heparin @ 1300 units/hr, Levo @ 5 mcg/min.    10/20 - Pt remains intubated following surgery yesterday: ex lap with omentopexy of  jejunal disruption and washout with placement of drains.  - Procedures done on 10/17: placement of G- and J-tubes and EGD with dilation of duodenal stricture, gastric biopsies.  - Per Surgery note this AM, plan for J-tube study via Fluoroscopy 10/23 and if no leak present, can start trickle TF at that time.  - Spoke with pt's niece, who is at bedside. She is concerned about nutritional status with no nutrition support.  - Talked with her about previous TPN provision and TF yesterday.  - She states that she has not been able to talk with any one from the surgical team concerning plan for nutrition.  - Per chart review, weight -3.1 kg from yesterday.  - CBW used to re-estimate nutrition needs.   Patient is currently intubated on ventilator support MV: 12.2 L/min Temp (24hrs), Avg:97.4 F (36.3 C), Min:94.3 F (34.6 C), Max:98.9 F (37.2 C) Propofol: none BP: 153/23 and MAP: 66  IVF: NS @ 100 mL/hr. Drip: Precedex @ 0.7 mcg/kg/hr.     10/19 - Patient pulled out PICC and G-tube overnight.  - Pt with increased confusion.  - TPN not planned to restart.  - Currently receiving Osmolite 1.5 @ 30 ml/hr and 30 ml Prostat via J-tube, providing 1180 kcal and 60g protein. - In total pt receiving from TF + IVF:1938 kcal and 60g protein.  - Nurse techs at bedside report pt was tolerating feeds. However, pt is leaking from the G-tube site. - Would advance as able towards goal rate of 65 ml/hr.  Diet Order:  Diet NPO time specified  Skin:  Wound (see comment) (Abdominal incision)  Last BM:  10/16  Height:   Ht Readings from Last 1 Encounters:  12/03/15 6\' 2"  (1.88 m)    Weight:   Wt Readings from Last 1 Encounters:  12/07/15 218 lb 11.1 oz (99.2 kg)    Ideal Body Weight:  80.9 kg  BMI:  Body mass index is 28.08 kg/m.  Estimated Nutritional Needs:   Kcal:  2120  Protein:  119-149 grams (1.2-1.5 grams/kg)  Fluid:  per Surgery/MD/NP given hyponatremia and renal  function tests  EDUCATION NEEDS:   No education needs identified at this time    Trenton GammonJessica Laine Giovanetti, MS, RD, LDN Inpatient Clinical Dietitian Pager # [phone] After hours/weekend pager # [phone]

## 2015-12-07 NOTE — Progress Notes (Signed)
Date:  December 07, 2015 Chart reviewed for concurrent status and case management needs. Will continue to follow the patient for status change: remains on full ventilator support. Discharge Planning: following for needs Expected discharge date: [id number] Marcelle SmilingRhonda Davis, BSN, JardineRN3, [place]   [phone]

## 2015-12-07 NOTE — Progress Notes (Signed)
RT assessment done. Pt has an upper airway expiratory RH. RT worked with pt on coughing and splinting side.

## 2015-12-07 NOTE — Consult Note (Signed)
WOC Nurse wound consult note Reason for Consult: peritube skin erythema, maceration and skin breakdown.  Wife and daughter in room and agree with POC.  Wound type:Moisture associated skin damage, specifically contact dermatitis Pressure Ulcer POA: No Measurement: 12 x 12 cm area of erythema with pinpoint partial thickness tissue loss on left abdomen.  Right abdomen with less erythema, but an area of medical adhesive related skin injury (MARSI) measuring 1cm x 2.5cm x 0.1cm with pink, moist tissue evident and scant serous exudate. Wound bed:See above Drainage (amount, consistency, odor) See above Periwound: see above and intact. Dressing procedure/placement/frequency: 60M Cavilon Advanced used as a skin protectant after cleansing peritube skin with tepid water and patting gently, dry. As this product will protect skin for approximately 72 hours, it will not require another application until Thursday. Nursing has been provided with guidance for management of peritube drainage with drain sponges.  I will follow and see tomorrow and Thursday of this week. WOC nursing team will not follow, but will remain available to this patient, the nursing and medical teams.  Please re-consult if needed. Thanks, Ladona MowLaurie Devere Brem, MSN, RN, GNP, Hans EdenCWOCN, CWON-AP, FAAN  Pager# [phone]

## 2015-12-07 NOTE — Procedures (Signed)
Extubation Procedure Note  Patient Details:   Name: [name] DOB: 1941/02/05 MRN: [id number]   Airway Documentation:     Evaluation  O2 sats: 100  Complications: No apparent complications Patient did tolerate procedure well. Per Dr Kendrick FriesMcQuaid ok to extubate.  Pt is awake and can follow commands.  Pt has good cough/gag, positive cuff leak.  BBS Clear Dim. Extubated pt to 3L [place] with no complications.  HR 74, RR 21, Bp 118/38. Pt tol well. No stridor.     Kandis NabHester, Nekia Maxham Lynn 12/07/2015, 11:43 AM

## 2015-12-07 NOTE — Progress Notes (Signed)
ANTICOAGULATION CONSULT NOTE - Follow-up  Pharmacy Consult for heparin IV Indication: RUE thrombus  Allergies  Allergen Reactions  . Flomax [Tamsulosin Hcl] Other (See Comments)    Leg weakness  . Lisinopril Cough  . Lorazepam     "i don't like it"  Prefers Xanax or valium  . Uroxatral [Alfuzosin Hcl Er] Other (See Comments)    Leg weakness    Patient Measurements: Height: 6\' 2"  (188 cm) Weight: 218 lb 11.1 oz (99.2 kg) IBW/kg (Calculated) : 82.2 Heparin Dosing Weight: 95 kg (TBW)  Vital Signs: Temp: 97.7 F (36.5 C) (10/23 0355) Temp Source: Axillary (10/23 0355) BP: 142/40 (10/23 0600) Pulse Rate: 51 (10/23 0600)  Labs:  Recent Labs  12/04/15 2020  12/05/15 0514 12/05/15 1310 12/05/15 1540 12/06/15 0500 12/07/15 0430  HGB  --   --  12.8* 11.6*  --  10.7* 10.2*  HCT  --   --  37.6* 34.1*  --  30.7* 29.1*  PLT  --   --  206 179  --  199 184  APTT 39*  --   --   --   --   --   --   LABPROT 19.4*  --   --   --   --   --   --   INR 1.62  --   --   --   --   --   --   HEPARINUNFRC  --   < > 0.66  --  0.60 0.67 0.55  CREATININE  --   --  1.66*  --   --  1.14 1.15  < > = values in this interval not displayed.  Estimated Creatinine Clearance: 69.9 mL/min (by C-G formula based on SCr of 1.15 mg/dL).  Medications, infusions: . sodium chloride 100 mL/hr at 12/06/15 2100  . dexmedetomidine 1 mcg/kg/hr (12/07/15 0600)  . heparin 1,300 Units/hr (12/07/15 0600)  . norepinephrine (LEVOPHED) Adult infusion 8 mcg/min (12/07/15 0600)    Assessment: 75 yo M with placement of gastrostomy and feeding jejunostomy tubes, s/p recent laparoscopy with washout and omentopexy repair on 10/19, now with distal brachial thrombus likely related to A-line placed there; fingers purple with unpalpable pulse. Per surgery, ok to start heparin for thrombus.  Significant events: 10/20: brachial A-line removed 10/21: having to collect heparin level from central line due to inability to get  peripheral (including foot stick) collection  Today, 12/07/2015:  Heparin level 0.55, remains therapeutic on current rate of 1300 units/hr (just above more conservative goal of 0.3-0.5 postop)  CBC: Hgb 10.2 is trending dowin; PLTC WNL  No bleeding or infusion issues noted.  Drain fluid remains serous with pink tinge per RN.  Goal of Therapy: Heparin level 0.3-0.7 units/ml, prefer 0.3-0.5 due to recent surgery Monitor platelets by anticoagulation protocol: Yes  Plan:  Continue Heparin at 1300 units/hr IV infusion  Daily heparin level  Daily CBC, closely watch Hgb for further significant decrease  Order instructions on daily CBC order states to dc heparin if hgb drops below 8 (from Dr  Michaell CowingGross standard post-op orders; need to clarify if hgb drops further)  Monitor for signs of bleeding or thrombosis   Lynann Beaverhristine Alma Muegge PharmD, BCPS Pager [phone] 12/07/2015 7:14 AM

## 2015-12-07 NOTE — Progress Notes (Signed)
Pt nasotracheal suctioned.  Pt remained stable throughout.  Pt SpO2 increased to 95% after suctioning.

## 2015-12-07 NOTE — Progress Notes (Signed)
PCCM PROGRESS NOTE  Admission date: 11/24/2015 Consult date: 12/03/2015 Referring provider: Dr. Michaell CowingGross  CC: Abdominal pain  Subjective:  Remains on low dose levophed @ 5 mcg.  No acute events overnight.  Off sedation.  Weaning on 5/5.   Vital signs: BP (!) 114/49   Pulse (!) 52   Temp 98.2 F (36.8 C) (Axillary)   Resp 15   Ht 6\' 2"  (1.88 m)   Wt 218 lb 11.1 oz (99.2 kg)   SpO2 100%   BMI 28.08 kg/m   Intake/output: I/O last 3 completed shifts: In: 5484.4 [I.V.:5014.4; Other:270; IV Piggyback:200] Out: 3130 [Urine:1700; Drains:1430]   Vent settings: Vent Mode: PRVC FiO2 (%):  [30 %] 30 % Set Rate:  [20 bmp] 20 bmp Vt Set:  [213[620 mL] 620 mL PEEP:  [5 cmH20] 5 cmH20 Pressure Support:  [5 cmH20] 5 cmH20 Plateau Pressure:  [14 cmH20-15 cmH20] 15 cmH20  General: ill appearing elderly male in NAD Neuro: RASS 0 HEENT: ETT in place Cardiac: regular, no murmur Chest: even/non-labored, lungs coarse with scattered rhonchi bilaterally  Abd: soft, non tender, midline dressing, 3 JP drains on R, G/J tubes on L Ext: 1+ edema Skin: ischemic changes to Rt index finger (improved)   CMP Latest Ref Rng & Units 12/07/2015 12/06/2015 12/05/2015  Glucose 65 - 99 mg/dL 086(V114(H) 784(O116(H) 962(X106(H)  BUN 6 - 20 mg/dL 52(W46(H) 41(L49(H) 24(M50(H)  Creatinine 0.61 - 1.24 mg/dL 0.101.15 2.721.14 5.36(U1.66(H)  Sodium 135 - 145 mmol/L 137 135 131(L)  Potassium 3.5 - 5.1 mmol/L 3.7 4.0 5.1  Chloride 101 - 111 mmol/L 112(H) 109 106  CO2 22 - 32 mmol/L 19(L) 19(L) 18(L)  Calcium 8.9 - 10.3 mg/dL 6.9(L) 7.0(L) 7.1(L)  Total Protein 6.5 - 8.1 g/dL - - -  Total Bilirubin 0.3 - 1.2 mg/dL - - -  Alkaline Phos 38 - 126 U/L - - -  AST 15 - 41 U/L - - -  ALT 17 - 63 U/L - - -    CBC Latest Ref Rng & Units 12/07/2015 12/06/2015 12/05/2015  WBC 4.0 - 10.5 K/uL 15.0(H) 21.2(H) 20.2(H)  Hemoglobin 13.0 - 17.0 g/dL 10.2(L) 10.7(L) 11.6(L)  Hematocrit 39.0 - 52.0 % 29.1(L) 30.7(L) 34.1(L)  Platelets 150 - 400 K/uL 184 199 179     ABG    Component Value Date/Time   PHART 7.423 12/04/2015 0840   PCO2ART 28.4 (L) 12/04/2015 0840   PO2ART 80.8 (L) 12/04/2015 0840   HCO3 18.2 (L) 12/04/2015 0840   TCO2 26 12/04/2007 0638   ACIDBASEDEF 4.7 (H) 12/04/2015 0840   O2SAT 96.7 12/04/2015 0840    CBG (last 3)   Recent Labs  12/06/15 2348 12/07/15 0353 12/07/15 0749  GLUCAP 101* 111* 103*     Imaging: Dg Chest Port 1 View  Result Date: 12/07/2015 CLINICAL DATA:  Respiratory failure. EXAM: PORTABLE CHEST 1 VIEW COMPARISON:  12/05/2015. FINDINGS: Endotracheal tube left subclavian line in stable position. Heart size normal. Stable mild atelectasis left lung base. No pleural effusion or pneumothorax . IMPRESSION: 1. Lines and tubes in stable position. 2. Stable mild left base atelectasis. Electronically Signed   By: Maisie Fushomas  Register   On: 12/07/2015 07:32   Studies: 10/11 CT abdomen/pelvis> significant distension of the stomach with layering fluid and gas, NG present, worrisome for recurrent gastric outlet obstruction.  RLL consolidation, cholecycstectomy  Cultures: Blood 10/19 >>  Antibiotics: Zosyn 10/19 >> Anidulafungin 10/19 >>   Events: 10/10  admission 10/17  Lap placement of J &  G tubes, EGD w/ balloon dilation of duodenal stricture, gastric biopsies, closure of gastrotomy 10/18  sundowning, pulled out G tube 10/19  laparaoscopy (findings gastric leak, bilious ascites, jejunal disruption), omentoplexy of jejunal disruption, gastric leak  Lines/tube: 10/19 ETT >> 10/19 CVL left subclavian >>  10/19 R brachial arterial line >> 10/20 10/19 abdominal drain x3 >> 10/19 G tube >> 10/19 J tube >>  Summary: 75 yo male former smoker with progressive epigastric pain, nausea, vomiting, bloating from gastric outlet obstruction with pyloric channel ulcer.  Had Billroth 1, vagotomy, fundoplication, omentopexy August 2017 with initial improvement.  Had recurrent symptoms September 2017 that become  progressively worse with gastric dilation from recurrent stricture.  Had laparoscopic placement of jejunostomy tube, gastrostomy tube, and EGD balloon dilation of duodenal stricture 10/17.  Developed gastric leakage with peritonitis after pulling out PEG tube 10/19 and taken to OR.  PMHx of CAD s/p stent, GERD, HTN, HLD  Assessment/plan:  Acute respiratory failure. - pressure support wean as tolerated  - hopeful to extubate soon - f/u CXR  Septic shock 2nd to peritonitis. Adrenal Insufficiency secondary to shock/peritonitis Hx of CAD, HTN, HLD. - wean pressors to keep MAP > 65 - continue IV fluids - hold outpt asa, lipitor, diovan  Recurrent gastric outlet obstruction. Peritonitis. Nutrition. - post-op care, nutrition per [facility] - Day 5 of zosyn, anidulafungin  AKI >> improved. - optimize hemodynamics - monitor renal function, urine outpt  Acute metabolic encephalopathy. - RASS goal 0 to -1  Rt index finger ischemia likely from Rt brachial a line. - VVS consulted 10/20, appreciate input - IV heparin   Hyperglycemia. - SSI  SUP - Protonix DVT prophylaxis - heparin gtt, SCDs Goals of care - Full code  Daughter updated at bedside 10/23.  Canary Brim, NP-C [facility] Pgr: [phone] or if no answer [phone] 12/07/2015, 10:32 AM

## 2015-12-07 NOTE — Progress Notes (Signed)
PCCM Interval Note  Asked to evaluate [name] for resp status and airway protection   74 with complicated gastric surgery as detailed in his consult notes, repeat GJ tube placement in OR after a leak + peritonitis + sepsis. He was extubated earlier today. He has exhibited some increased WOB, intermittent tachypnea, poor secretion clearance. He has been suctioned x 2 with thick mucous obtained that he has been unable to manage. He c/o abdominal pain  Vitals:   12/07/15 1900 12/07/15 2000 12/07/15 2050 12/07/15 2100  BP: (!) 154/63 (!) 189/61 (!) 189/61 (!) 158/54  Pulse:   (!) 133   Resp: (!) 29 (!) 32 (!) 35 (!) 34  Temp:  98.2 F (36.8 C)    TempSrc:  Axillary    SpO2: 97% 98% 95% 95%  Weight:      Height:      Gen: Awake but very weak, mild distress  ENT: some UA noise from secretions. Poor phonation poor cough  Neck: No JVD, no TMG, no overt stridor  Lungs: decreased especially R mid lung and L base. One-word sentences  Cardiovascular: tachy and regular, heart sounds normal, no murmur or gallops, trace peripheral edema  Musculoskeletal: No deformities, no cyanosis or clubbing  Neuro: awake, a bit slow to respond but appears to answer appropriately, globally weak  Skin: Warm, no lesions or rashes   Plan:  Based on his lung exam and marginal airway protection, I believe that he will unfortunately need to be re-intubated. Discussed rationale with him and his family at bedside. All understand the plan.  Independent CC time 30 minutes.   Levy Pupaobert Deon Ivey, MD, PhD 12/07/2015, 9:28 PM [facility] [phone] or if no answer [phone]

## 2015-12-07 NOTE — Procedures (Signed)
Intubation Procedure Note Ardyth GalRalph T Heidelberg [id number] 11-Jan-1941  Procedure: Intubation Indications: Airway protection and maintenance  Procedure Details Consent: Risks of procedure as well as the alternatives and risks of each were explained to the (patient/caregiver).  Consent for procedure obtained. Time Out: Verified patient identification, verified procedure, site/side was marked, verified correct patient position, special equipment/implants available, medications/allergies/relevent history reviewed, required imaging and test results available.  Performed  Maximum sterile technique was used including gloves, gown and hand hygiene.  Mac-4 Glidescope  7.5ETT placed using Mac-4 glidescope. Placement confirmed by direct vis, ETCO2, auscultation. No complications.   Meds: fentanyl 100, versed 2, etomidate 40   Evaluation Hemodynamic Status: BP stable throughout; O2 sats: stable throughout Patient's Current Condition: stable Complications: No apparent complications Patient did tolerate procedure well. Chest X-ray ordered to verify placement.  CXR: pending.  Levy Pupaobert Windsor Zirkelbach, MD, PhD 12/07/2015, 9:50 PM [facility] [phone] or if no answer [phone]

## 2015-12-07 NOTE — Progress Notes (Addendum)
KUB with J-tube injection shows no definite intraperitoneal leak of contrast.  Question.of extravasation of contrast superficially and superiolaterally.  The Dx'd intraperionteal leak point patched by omentopexy was medially and inferiorly, away from the area of concern.  We'll start very low dose rate tube feeds.  Just 10 mL/ hour.  Follow clinically.  Advance to 2730mL/hour the next day if doing well, then advance to goal once he starts having bowel movements.  Maybe start giving liquid meds through the jejunostomy tube in a few days if tolerates tube feeds, arguing against any leak or concern.  If pt feels worse/declines, then hold tube feeds and place on IV TNA nutrition and reevaluate in one week.  Would like to hold off on any more PICC/central lines given the fact that he is anticoagulated on heparin with an embolic event  Ardeth SportsmanSteven C. Jhoselyn Ruffini, M.D., F.A.C.S. Gastrointestinal and Minimally Invasive Surgery [facility] [address] [place], [place] [id number] [phone] Main / Paging

## 2015-12-08 ENCOUNTER — Inpatient Hospital Stay (HOSPITAL_COMMUNITY): Payer: Medicare Other

## 2015-12-08 LAB — BASIC METABOLIC PANEL
Anion gap: 6 (ref 5–15)
BUN: 45 mg/dL — AB (ref 6–20)
CALCIUM: 7.2 mg/dL — AB (ref 8.9–10.3)
CHLORIDE: 115 mmol/L — AB (ref 101–111)
CO2: 20 mmol/L — AB (ref 22–32)
CREATININE: 1.22 mg/dL (ref 0.61–1.24)
GFR calc non Af Amer: 56 mL/min — ABNORMAL LOW (ref >60)
GLUCOSE: 104 mg/dL — AB (ref 65–99)
Potassium: 3.6 mmol/L (ref 3.5–5.1)
Sodium: 141 mmol/L (ref 135–145)

## 2015-12-08 LAB — GLUCOSE, CAPILLARY
GLUCOSE-CAPILLARY: 109 mg/dL — AB (ref 65–99)
GLUCOSE-CAPILLARY: 114 mg/dL — AB (ref 65–99)
GLUCOSE-CAPILLARY: 114 mg/dL — AB (ref 65–99)
GLUCOSE-CAPILLARY: 117 mg/dL — AB (ref 65–99)
GLUCOSE-CAPILLARY: 118 mg/dL — AB (ref 65–99)
GLUCOSE-CAPILLARY: 119 mg/dL — AB (ref 65–99)
GLUCOSE-CAPILLARY: 121 mg/dL — AB (ref 65–99)
GLUCOSE-CAPILLARY: 123 mg/dL — AB (ref 65–99)
GLUCOSE-CAPILLARY: 149 mg/dL — AB (ref 65–99)
GLUCOSE-CAPILLARY: 158 mg/dL — AB (ref 65–99)
GLUCOSE-CAPILLARY: 91 mg/dL (ref 65–99)
Glucose-Capillary: 91 mg/dL (ref 65–99)
Glucose-Capillary: 102 mg/dL — ABNORMAL HIGH (ref 65–99)
Glucose-Capillary: 118 mg/dL — ABNORMAL HIGH (ref 65–99)
Glucose-Capillary: 121 mg/dL — ABNORMAL HIGH (ref 65–99)
Glucose-Capillary: 125 mg/dL — ABNORMAL HIGH (ref 65–99)
Glucose-Capillary: 130 mg/dL — ABNORMAL HIGH (ref 65–99)
Glucose-Capillary: 132 mg/dL — ABNORMAL HIGH (ref 65–99)
Glucose-Capillary: 134 mg/dL — ABNORMAL HIGH (ref 65–99)
Glucose-Capillary: 138 mg/dL — ABNORMAL HIGH (ref 65–99)
Glucose-Capillary: 109 mg/dL — ABNORMAL HIGH (ref 65–99)

## 2015-12-08 LAB — HEPARIN LEVEL (UNFRACTIONATED): HEPARIN UNFRACTIONATED: 0.56 [IU]/mL (ref 0.30–0.70)

## 2015-12-08 LAB — CBC
HEMATOCRIT: 30.3 % — AB (ref 39.0–52.0)
Hemoglobin: 10.3 g/dL — ABNORMAL LOW (ref 13.0–17.0)
MCH: 30.7 pg (ref 26.0–34.0)
MCHC: 34 g/dL (ref 30.0–36.0)
MCV: 90.2 fL (ref 78.0–100.0)
Platelets: 223 10*3/uL (ref 150–400)
RBC: 3.36 MIL/uL — ABNORMAL LOW (ref 4.22–5.81)
RDW: 14.6 % (ref 11.5–15.5)
WBC: 18.1 10*3/uL — ABNORMAL HIGH (ref 4.0–10.5)

## 2015-12-08 LAB — PHOSPHORUS
Phosphorus: 3.5 mg/dL (ref 2.5–4.6)
Phosphorus: 3.3 mg/dL (ref 2.5–4.6)

## 2015-12-08 LAB — MAGNESIUM
Magnesium: 2.2 mg/dL (ref 1.7–2.4)
Magnesium: 2.2 mg/dL (ref 1.7–2.4)

## 2015-12-08 MED ORDER — MORPHINE SULFATE (PF) 2 MG/ML IV SOLN
2.0000 mg | INTRAVENOUS | Status: DC | PRN
  Administered 2015-12-08 – 2015-12-09 (×2): 2 mg via INTRAVENOUS
  Administered 2015-12-09: 4 mg via INTRAVENOUS
  Administered 2015-12-09: 2 mg via INTRAVENOUS
  Administered 2015-12-10: 4 mg via INTRAVENOUS
  Filled 2015-12-08 (×3): qty 1
  Filled 2015-12-08: qty 2
  Filled 2015-12-08 (×2): qty 1

## 2015-12-08 MED ORDER — VITAL AF 1.2 CAL PO LIQD
1000.0000 mL | ORAL | Status: DC
  Administered 2015-12-08: 1000 mL
  Filled 2015-12-08 (×2): qty 1000

## 2015-12-08 MED ORDER — SODIUM CHLORIDE 0.9 % IV BOLUS (SEPSIS)
500.0000 mL | Freq: Once | INTRAVENOUS | Status: AC
  Administered 2015-12-08: 500 mL via INTRAVENOUS

## 2015-12-08 MED ORDER — LACTATED RINGERS IV BOLUS (SEPSIS): 1000.0000 mL | Freq: Three times a day (TID) | INTRAVENOUS | Status: AC | PRN

## 2015-12-08 MED ORDER — ACETAMINOPHEN 160 MG/5ML PO SOLN
650.0000 mg | Freq: Four times a day (QID) | ORAL | Status: DC
  Administered 2015-12-08 – 2015-12-09 (×4): 650 mg
  Filled 2015-12-08 (×4): qty 20.3

## 2015-12-08 MED ORDER — FAMOTIDINE 40 MG/5ML PO SUSR
20.0000 mg | Freq: Two times a day (BID) | ORAL | Status: DC
  Filled 2015-12-08: qty 2.5

## 2015-12-08 MED ORDER — FUROSEMIDE 10 MG/ML IJ SOLN
40.0000 mg | Freq: Two times a day (BID) | INTRAMUSCULAR | Status: DC
  Administered 2015-12-08 (×2): 40 mg via INTRAVENOUS
  Filled 2015-12-08 (×2): qty 4

## 2015-12-08 MED ORDER — RANITIDINE HCL 150 MG/10ML PO SYRP
150.0000 mg | ORAL_SOLUTION | Freq: Two times a day (BID) | ORAL | Status: DC
  Administered 2015-12-08 (×2): 150 mg
  Filled 2015-12-08 (×3): qty 10

## 2015-12-08 MED ORDER — FENTANYL CITRATE (PF) 100 MCG/2ML IJ SOLN
25.0000 ug | INTRAMUSCULAR | Status: DC | PRN
  Administered 2015-12-08 (×5): 50 ug via INTRAVENOUS
  Administered 2015-12-09 (×2): 25 ug via INTRAVENOUS
  Administered 2015-12-10 (×4): 50 ug via INTRAVENOUS
  Filled 2015-12-08 (×12): qty 2

## 2015-12-08 NOTE — Progress Notes (Signed)
ANTICOAGULATION CONSULT NOTE - Follow-up  Pharmacy Consult for heparin IV Indication: RUE thrombus  Allergies  Allergen Reactions  . Flomax [Tamsulosin Hcl] Other (See Comments)    Leg weakness  . Lisinopril Cough  . Lorazepam     "i don't like it"  Prefers Xanax or valium  . Uroxatral [Alfuzosin Hcl Er] Other (See Comments)    Leg weakness    Patient Measurements: Height: 6\' 2"  (188 cm) Weight: 214 lb 4.6 oz (97.2 kg) IBW/kg (Calculated) : 82.2 Heparin Dosing Weight: 95 kg (TBW)  Vital Signs: Temp: 98.3 F (36.8 C) (10/24 0357) Temp Source: Axillary (10/24 0357) BP: 124/48 (10/24 0700) Pulse Rate: 96 (10/24 0412)  Labs:  Recent Labs  12/06/15 0500 12/07/15 0430 12/08/15 0500  HGB 10.7* 10.2* 10.3*  HCT 30.7* 29.1* 30.3*  PLT 199 184 223  HEPARINUNFRC 0.67 0.55 0.56  CREATININE 1.14 1.15 1.22    Estimated Creatinine Clearance: 60.8 mL/min (by C-G formula based on SCr of 1.22 mg/dL).  Medications, infusions: . sodium chloride 50 mL/hr at 12/08/15 0600  . dexmedetomidine Stopped (12/07/15 0858)  . fentaNYL infusion INTRAVENOUS 250 mcg/hr (12/08/15 0700)  . heparin 1,300 Units/hr (12/08/15 0600)  . norepinephrine (LEVOPHED) Adult infusion Stopped (12/07/15 1400)    Assessment: 75 yo M with placement of gastrostomy and feeding jejunostomy tubes, s/p recent laparoscopy with washout and omentopexy repair on 10/19, now with distal brachial thrombus likely related to A-line placed there; fingers purple with unpalpable pulse. Per surgery, ok to start heparin for thrombus.  Significant events: 10/20: brachial A-line removed 10/21: having to collect heparin level from central line due to inability to get peripheral (including foot stick) collection.  Holding heparin briefly and flushing prior to obtaining sample.  Today, 12/08/2015:  Heparin level 0.56, remains therapeutic on current rate of 1300 units/hr (just above more conservative goal of 0.3-0.5 postop)  CBC:  Hgb 10.3 remains low/stable, PLTC WNL  No bleeding or infusion issues noted.  Drain fluid remains serous with pink tinge per RN.  Goal of Therapy: Heparin level 0.3-0.7 units/ml, prefer 0.3-0.5 due to recent surgery Monitor platelets by anticoagulation protocol: Yes  Plan:  Continue Heparin at 1300 units/hr IV infusion  Daily heparin level  Daily CBC, closely watch Hgb for further significant decrease  Order instructions on daily CBC order states to dc heparin if hgb drops below 8 (from Dr. Michaell CowingGross standard post-op orders; need to clarify if hgb drops further)  Monitor for signs of bleeding or thrombosis   Lynann Beaverhristine Enriqueta Augusta PharmD, BCPS Pager [phone] 12/08/2015 7:12 AM

## 2015-12-08 NOTE — Progress Notes (Signed)
Pr MD order, ETT advanced 2 cm to 26 cm @ the teeth.

## 2015-12-08 NOTE — Progress Notes (Addendum)
[place]  [address] [place], [place] [address] Phone: [phone] FAX: [phone]   [name] [id number] 1941-01-31  CARE TEAM:  PCP: Irven Shelling, MD  Outpatient Care Team: Patient Care Team: Lavone Orn, MD as PCP - General (Internal Medicine) Michael Boston, MD as Consulting Physician (General Surgery) Arta Silence, MD as Consulting Physician (Gastroenterology)  Inpatient Treatment Team: Treatment Team: Attending Provider: Michael Boston, MD; Consulting Physician: Arta Silence, MD; Technician: Sueanne Margarita, NT; Speech Language Pathologist: Narda Rutherford, CCC-SLP; Consulting Physician: Md Pccm, MD; Rounding Team: Md Pccm, MD; Consulting Physician: Serafina Mitchell, MD; Registered Nurse: Sheron Nightingale, RN; Registered Nurse: Meredith Mody, RN; Registered Nurse: Lynnell Dike, RN  SURGERY 12/03/2015  POST-OPERATIVE DIAGNOSIS:    Peritonitis Jejunal disruption Gastric leak  PROCEDURE:    LAPAROSCOPY DIAGNOSTIC OMENTOPEXY of jejunal disruption [place] OUT x 21L with drains placement  SURGEON:  Michael Boston, MD   Post-Op 12/01/2015  POST-OPERATIVE DIAGNOSIS:    RECURRENT GASTRIC OUTLET OBSTRUCTION DUE to significant edema at gastroduodenal anastomosis. GASTRITIS Failure to thrive with malnutrition  PROCEDURE:   LAPAROSCOPIC PLACEMENT OF FEEDING JEJUNOSTOMY AND GASTROSTOMY TUBEs ESOPHAGOGASTRODUODENOSCOPY (EGD) BALLOON DILATION OF DUODENAL STRICTURE Gastric biopsies  Closure of gastrotomy  SURGEON:  Michael Boston, MD  Prior surgery 09/25/2015  POST-OPERATIVE DIAGNOSIS:  Partial gastric outlet obstruction due to chronic pyloric stricture  PROCEDURE:   XI ROBOTIC distal gastrectomy BILROTH I  ANASTOMOSIS  ANTERIOR AND POSTERIOR VAGOTOMY DOR (Anterior 982 degree) FUNDIPLICATION OMENTOPEXY UPPER ENDOSCOPY  SURGEON:  Michael Boston, MD   Problem List:   Principal  Problem:   Bile peritonitis due to gastric tube dislodgement Active Problems:   Essential hypertension, benign   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 09/25/2015   Hyperlipidemia   Anxiety state   Gastric outlet obstruction   Gastric Ileus    Protein-calorie malnutrition, moderate (HCC)   Encephalopathy acute   Endotracheally intubated   On mechanically assisted ventilation (HCC)   Protein-calorie malnutrition, severe   Acute respiratory failure with hypoxia (HCC)    Assessment/Plan:  GUARDED s/p patching at jejunum & resecuring Gtube & massive washout  -diuresis as tolerated to help with secretions/vent  -inc TF gradually.  If cannot tolerate, stop TFs x 1 week & start TNA  -Gtube to LIWS for ileus & inflammatory narrowing at gastroduodenal Bilroth I anastomosis.  Skin care.  PPI, H2B for gastriris./  F/u Bx's for Hpylori neg  Diagnosis Stomach, biopsy - CHRONIC FOCALLY ACTIVE GASTRITIS. - THERE IS NO EVIDENCE OF HELICOBACTER PYLORI, DYSPLASIA OR MALIGNANCY.   -wean vent as tolerated.  D/w PCCM Dr [place] Bells.  Pt may need trach within the week if still an issue with sedation & secretions  -try standing tylenol for pain.  Gradually use Jtube liquid meds if tolerates TFs  -Zosyn/antifungal IV x 5 days minimum.  shock less, & drains much more serous are hopeful signs  -Since he pulled out his PICC line, hold off on replacement and restarting TNA until shock cleared if cannot tolerate TFs  -heparin for brachial artery embolus to right index finger. Dr. Trula Slade with vascular surgery in a procedure at now but will try and get feedback from him on length of Tx  -Sedation/Anxiolysis - challenge with pt that goes from sleeping to pulling at lines/tubes.  mits for now.   Consider transitioning from Xanax and benzos and utilizing haldol/zyprexa/seroquel  Sundowning precautions.     VTE prophylaxis -  SCDs  Disposition: He will deftly required skilled care  facility with rehabilitation capacity upon discharge from the hospital.  I suspect it's going to take at least a week if not several before he'll be ready for that transition.  His daughter expressed understanding and appreciation   I discussed operative findings, updated the patient's status, discussed probable steps to recovery, and gave postoperative recommendations to the patient's daughter & ICU RN.  Recommendations were made.  Questions were answered.  They expressed understanding & appreciation.    [name], M.D., F.A.C.S. Gastrointestinal and Minimally Invasive Surgery [facility] [address], [place], [place] [address] [phone] Main / Paging    12/08/2015   Subjective:  Extubated. Reintubated 10hr later due to weakness & inc work of breathing Off pressors Some leaking around G tube - stopped this AM No leaking around Jtube ICU nurses in room No family in room today  Objective:  Vital signs:  Vitals:   12/08/15 0412 12/08/15 0500 12/08/15 0600 12/08/15 0700  BP: (!) 156/37 (!) 146/73 (!) 155/43 (!) 124/48  Pulse: 96     Resp: 16 13 13 14   Temp:      TempSrc:      SpO2: 100% 100% 100% 100%  Weight:      Height:        Last BM Date: 11/30/15 (per EMR)  Intake/Output   Yesterday:  10/23 0701 - 10/24 0700 In: 3280.5 [I.V.:2203; NG/GT:665.5; IV Piggyback:412] Out: 8338 [Urine:1150; Drains:1255] This shift:  No intake/output data recorded.  Bowel function:  Flatus: No  BM:  No  Drain: Gtube thick dark bilious.  J tube  Blake drains all serous - no bile tinge    Physical Exam:  General: Pt sleeping No acute distress at best.  Follows commands per ICU RN Eyes: PERRL, normal EOM.  Sclera clear.  No icterus Neuro: CN II-XII intact w/o focal sensory/motor deficits. Lymph: No head/neck/groin lymphadenopathy Psych:  No delerium/psychosis/paranoia HENT: Normocephalic, Mucus membranes moist.  No thrush. ETT  in place Neck: Supple, No tracheal deviation Chest: CTAB. Chest wall pain w good excursion CV:  Pulses intact.  Regular rhythm MS: Normal AROM mjr joints.  No obvious deformity Abdomen:  G tube LUQ to LIWS old blood.  Rash around skin but dry - dressings clean around it.  J tube in LLQ with 10/fr TFs - skin clean  Softer.  Moderately distended.  Tenderness at Tube sites and lower abdomen..  No diffuse peritonitis but less TTP GU:  Foley in place.  NEMG Ext:  SCDs BLE.  No mjr edema.  Right index finger unchanged - distal pad & nail bed with mild cyanosis.  Good cap refill to nail bed  Skin: 2+ anasarca.  No other petechiae / purpura  Results:   Diagnosis Stomach, biopsy - CHRONIC FOCALLY ACTIVE GASTRITIS. - THERE IS NO EVIDENCE OF HELICOBACTER PYLORI, DYSPLASIA OR MALIGNANCY. - SEE COMMENT. Microscopic Comment A Warthin-Starry stain is negative for the presence of Helicobacter pylori organisms. (JBK:gt, 12/04/15) Enid Cutter MD Pathologist, Electronic Signature (Case signed 12/04/2015) Specimen Greg Cratty and Clinical Information Specimen(s) Obtained: Stomach, biopsy Specimen Clinical  Labs: Results for orders placed or performed during the hospital encounter of 11/24/15 (from the past 48 hour(s))  Glucose, capillary     Status: Abnormal   Collection Time: 12/06/15  7:56 AM  Result Value Ref Range   Glucose-Capillary 106 (H) 65 - 99 mg/dL   Comment 1 Notify RN  Comment 2 Document in Chart   Cortisol     Status: None   Collection Time: 12/06/15 10:46 AM  Result Value Ref Range   Cortisol, Plasma 19.4 ug/dL    Comment: (NOTE) AM    6.7 - 22.6 ug/dL PM   <10.0       ug/dL Performed at [facility]   Glucose, capillary     Status: Abnormal   Collection Time: 12/06/15 11:36 AM  Result Value Ref Range   Glucose-Capillary 111 (H) 65 - 99 mg/dL   Comment 1 Notify RN    Comment 2 Document in Chart   Glucose, capillary     Status: Abnormal   Collection Time: 12/06/15   3:25 PM  Result Value Ref Range   Glucose-Capillary 104 (H) 65 - 99 mg/dL   Comment 1 Notify RN    Comment 2 Document in Chart   Glucose, capillary     Status: Abnormal   Collection Time: 12/06/15  8:15 PM  Result Value Ref Range   Glucose-Capillary 105 (H) 65 - 99 mg/dL  Glucose, capillary     Status: Abnormal   Collection Time: 12/06/15 11:48 PM  Result Value Ref Range   Glucose-Capillary 101 (H) 65 - 99 mg/dL   Comment 1 Notify RN    Comment 2 Document in Chart   Glucose, capillary     Status: Abnormal   Collection Time: 12/07/15  3:53 AM  Result Value Ref Range   Glucose-Capillary 111 (H) 65 - 99 mg/dL   Comment 1 Notify RN    Comment 2 Document in Chart   Prealbumin     Status: Abnormal   Collection Time: 12/07/15  4:30 AM  Result Value Ref Range   Prealbumin <5 (L) 18 - 38 mg/dL    Comment: Performed at [facility]  Heparin level (unfractionated)     Status: None   Collection Time: 12/07/15  4:30 AM  Result Value Ref Range   Heparin Unfractionated 0.55 0.30 - 0.70 IU/mL    Comment:        IF HEPARIN RESULTS ARE BELOW EXPECTED VALUES, AND PATIENT DOSAGE HAS BEEN CONFIRMED, SUGGEST FOLLOW UP TESTING OF ANTITHROMBIN III LEVELS.   CBC     Status: Abnormal   Collection Time: 12/07/15  4:30 AM  Result Value Ref Range   WBC 15.0 (H) 4.0 - 10.5 K/uL   RBC 3.35 (L) 4.22 - 5.81 MIL/uL   Hemoglobin 10.2 (L) 13.0 - 17.0 g/dL   HCT 29.1 (L) 39.0 - 52.0 %   MCV 86.9 78.0 - 100.0 fL   MCH 30.4 26.0 - 34.0 pg   MCHC 35.1 30.0 - 36.0 g/dL   RDW 14.1 11.5 - 15.5 %   Platelets 184 150 - 400 K/uL  Basic metabolic panel     Status: Abnormal   Collection Time: 12/07/15  4:30 AM  Result Value Ref Range   Sodium 137 135 - 145 mmol/L   Potassium 3.7 3.5 - 5.1 mmol/L   Chloride 112 (H) 101 - 111 mmol/L   CO2 19 (L) 22 - 32 mmol/L   Glucose, Bld 114 (H) 65 - 99 mg/dL   BUN 46 (H) 6 - 20 mg/dL   Creatinine, Ser 1.15 0.61 - 1.24 mg/dL   Calcium 6.9 (L) 8.9 - 10.3 mg/dL    GFR calc non Af Amer >60 >60 mL/min   GFR calc Af Amer >60 >60 mL/min    Comment: (NOTE) The eGFR has been  calculated using the CKD EPI equation. This calculation has not been validated in all clinical situations. eGFR's persistently <60 mL/min signify possible Chronic Kidney Disease.    Anion gap 6 5 - 15  Magnesium     Status: None   Collection Time: 12/07/15  4:30 AM  Result Value Ref Range   Magnesium 2.1 1.7 - 2.4 mg/dL  Phosphorus     Status: None   Collection Time: 12/07/15  4:30 AM  Result Value Ref Range   Phosphorus 2.7 2.5 - 4.6 mg/dL  Glucose, capillary     Status: Abnormal   Collection Time: 12/07/15  7:49 AM  Result Value Ref Range   Glucose-Capillary 103 (H) 65 - 99 mg/dL  Glucose, capillary     Status: None   Collection Time: 12/07/15 11:10 AM  Result Value Ref Range   Glucose-Capillary 97 65 - 99 mg/dL  Glucose, capillary     Status: None   Collection Time: 12/07/15  3:48 PM  Result Value Ref Range   Glucose-Capillary 95 65 - 99 mg/dL  Magnesium     Status: None   Collection Time: 12/07/15  5:00 PM  Result Value Ref Range   Magnesium 2.2 1.7 - 2.4 mg/dL  Phosphorus     Status: None   Collection Time: 12/07/15  5:00 PM  Result Value Ref Range   Phosphorus 3.2 2.5 - 4.6 mg/dL  Glucose, capillary     Status: Abnormal   Collection Time: 12/07/15  7:35 PM  Result Value Ref Range   Glucose-Capillary 105 (H) 65 - 99 mg/dL   Comment 1 Notify RN    Comment 2 Document in Chart   Draw ABG 1 hour after initiation of ventilator     Status: Abnormal   Collection Time: 12/07/15 10:30 PM  Result Value Ref Range   FIO2 60.00    Delivery systems VENTILATOR    Mode PRESSURE REGULATED VOLUME CONTROL    VT 660 mL   LHR 14 resp/min   Peep/cpap 5.0 cm H20   pH, Arterial 7.344 (L) 7.350 - 7.450   pCO2 arterial 31.4 (L) 32.0 - 48.0 mmHg   pO2, Arterial 132 (H) 83.0 - 108.0 mmHg   Bicarbonate 16.7 (L) 20.0 - 28.0 mmol/L   Acid-base deficit 7.6 (H) 0.0 - 2.0 mmol/L   O2  Saturation 98.3 %   Patient temperature 98.2    Collection site LEFT RADIAL    Drawn by [id number]    Sample type ARTERIAL DRAW    Allens test (pass/fail) PASS PASS  Glucose, capillary     Status: None   Collection Time: 12/07/15 11:28 PM  Result Value Ref Range   Glucose-Capillary 97 65 - 99 mg/dL   Comment 1 Notify RN    Comment 2 Document in Chart   Glucose, capillary     Status: None   Collection Time: 12/08/15  3:44 AM  Result Value Ref Range   Glucose-Capillary 91 65 - 99 mg/dL   Comment 1 Notify RN    Comment 2 Document in Chart   Heparin level (unfractionated)     Status: None   Collection Time: 12/08/15  5:00 AM  Result Value Ref Range   Heparin Unfractionated 0.56 0.30 - 0.70 IU/mL    Comment:        IF HEPARIN RESULTS ARE BELOW EXPECTED VALUES, AND PATIENT DOSAGE HAS BEEN CONFIRMED, SUGGEST FOLLOW UP TESTING OF ANTITHROMBIN III LEVELS.   CBC     Status: Abnormal  Collection Time: 12/08/15  5:00 AM  Result Value Ref Range   WBC 18.1 (H) 4.0 - 10.5 K/uL   RBC 3.36 (L) 4.22 - 5.81 MIL/uL   Hemoglobin 10.3 (L) 13.0 - 17.0 g/dL   HCT 30.3 (L) 39.0 - 52.0 %   MCV 90.2 78.0 - 100.0 fL   MCH 30.7 26.0 - 34.0 pg   MCHC 34.0 30.0 - 36.0 g/dL   RDW 14.6 11.5 - 15.5 %   Platelets 223 150 - 400 K/uL  Basic metabolic panel     Status: Abnormal   Collection Time: 12/08/15  5:00 AM  Result Value Ref Range   Sodium 141 135 - 145 mmol/L   Potassium 3.6 3.5 - 5.1 mmol/L   Chloride 115 (H) 101 - 111 mmol/L   CO2 20 (L) 22 - 32 mmol/L   Glucose, Bld 104 (H) 65 - 99 mg/dL   BUN 45 (H) 6 - 20 mg/dL   Creatinine, Ser 1.22 0.61 - 1.24 mg/dL   Calcium 7.2 (L) 8.9 - 10.3 mg/dL   GFR calc non Af Amer 56 (L) >60 mL/min   GFR calc Af Amer >60 >60 mL/min    Comment: (NOTE) The eGFR has been calculated using the CKD EPI equation. This calculation has not been validated in all clinical situations. eGFR's persistently <60 mL/min signify possible Chronic Kidney Disease.    Anion gap  6 5 - 15  Magnesium     Status: None   Collection Time: 12/08/15  5:00 AM  Result Value Ref Range   Magnesium 2.2 1.7 - 2.4 mg/dL  Phosphorus     Status: None   Collection Time: 12/08/15  5:00 AM  Result Value Ref Range   Phosphorus 3.5 2.5 - 4.6 mg/dL    Imaging / Studies: Dg Chest Port 1 View  Result Date: 12/08/2015 CLINICAL DATA:  Respiratory failure. EXAM: PORTABLE CHEST 1 VIEW COMPARISON:  12/07/2015. FINDINGS: Endotracheal to in stable position. Left subclavian line in stable position. Progressive atelectatic changes left lower lobe. Associated infiltrate cannot be excluded. No pleural effusion or pneumothorax. IMPRESSION: 1. Lines and tubes stable position. 2. Progressive atelectatic changes left lower lobe. Associated left lower lobe infiltrate cannot be excluded . Electronically Signed   By: Marcello Moores  Register   On: 12/08/2015 07:27   Portable Chest Xray  Result Date: 12/07/2015 CLINICAL DATA:  Decreased oxygen saturation EXAM: PORTABLE CHEST 1 VIEW COMPARISON:  12/07/2015 FINDINGS: AP portable semi-erect view of the chest. Endotracheal tube tip is approximately 4.9 cm superior to the carina. Hazy opacity at the left lung base, likely atelectasis. This finding is unchanged. Possible tiny left effusion. No new consolidation. Cardiomediastinal silhouette stable. No pneumothorax. Left-sided central venous catheter tip overlies the SVC. IMPRESSION: 1. Support lines and tubes as above. 2. No significant interval change in hazy atelectasis or infiltrate at the left base. Electronically Signed   By: Donavan Foil M.D.   On: 12/07/2015 22:55   Dg Chest Port 1 View  Result Date: 12/07/2015 CLINICAL DATA:  Respiratory failure. EXAM: PORTABLE CHEST 1 VIEW COMPARISON:  12/05/2015. FINDINGS: Endotracheal tube left subclavian line in stable position. Heart size normal. Stable mild atelectasis left lung base. No pleural effusion or pneumothorax . IMPRESSION: 1. Lines and tubes in stable position.  2. Stable mild left base atelectasis. Electronically Signed   By: Marcello Moores  Register   On: 12/07/2015 07:32   Dg Abd Portable 1v  Result Date: 12/07/2015 CLINICAL DATA:  Evaluate percutaneous jejunostomy tube. EXAM:  PORTABLE ABDOMEN - 1 VIEW COMPARISON:  12/02/2015 FINDINGS: There is oral contrast in the jejunum in the left upper quadrant. I do not see any definite leaking intraperitoneal contrast. There is some aching contrast laterally which appears to be in the abdominal wall. IMPRESSION: Leakage of contrast near the percutaneous site, likely in the abdominal wall. No definite intraperitoneal leakage of contrast. Electronically Signed   By: Marijo Sanes M.D.   On: 12/07/2015 11:59    Medications / Allergies: per chart  Antibiotics: Anti-infectives    Start     Dose/Rate Route Frequency Ordered Stop   12/05/15 1200  anidulafungin (ERAXIS) 100 mg in sodium chloride 0.9 % 100 mL IVPB     100 mg over 90 Minutes Intravenous Every 24 hours 12/04/15 1045     12/04/15 1200  anidulafungin (ERAXIS) 200 mg in sodium chloride 0.9 % 200 mL IVPB     200 mg over 180 Minutes Intravenous  Once 12/04/15 1045 12/04/15 1725   12/04/15 0600  cefoTEtan (CEFOTAN) 2 g in dextrose 5 % 50 mL IVPB     2 g 100 mL/hr over 30 Minutes Intravenous On call to O.R. 12/03/15 1821 12/03/15 2007   12/04/15 0400  piperacillin-tazobactam (ZOSYN) IVPB 3.375 g     3.375 g 12.5 mL/hr over 240 Minutes Intravenous Every 8 hours 12/03/15 2229     12/02/15 0000  erythromycin ethylsuccinate (EES) 200 MG/5ML suspension 400 mg  Status:  Discontinued    Comments:  Administer via J tube (16Fr LLQ)   400 mg Oral Every 6 hours 12/01/15 2020 12/03/15 0748   11/27/15 1200  erythromycin 500 mg in sodium chloride 0.9 % 100 mL IVPB     500 mg 100 mL/hr over 60 Minutes Intravenous Every 6 hours 11/27/15 0940 11/29/15 0608   11/26/15 0830  erythromycin 250 mg in sodium chloride 0.9 % 100 mL IVPB  Status:  Discontinued     250 mg 100 mL/hr  over 60 Minutes Intravenous Every 6 hours 11/26/15 0751 11/27/15 0940        Note: Portions of this report may have been transcribed using voice recognition software. Every effort was made to ensure accuracy; however, inadvertent computerized transcription errors may be present.   Any transcriptional errors that result from this process are unintentional.    [name], M.D., F.A.C.S. Gastrointestinal and Minimally Invasive Surgery [facility] [address], [place], [place] [address] [phone] Main / Paging     12/08/2015

## 2015-12-08 NOTE — Progress Notes (Addendum)
[facility] aware of green/brown bile-like liquid around gastric tube at 2030.  G Tube currently to wall suction and no new orders given. Will continue to monitor and assess the site.

## 2015-12-08 NOTE — Progress Notes (Addendum)
Nutrition Follow-up  DOCUMENTATION CODES:   Severe malnutrition in context of acute illness/injury  INTERVENTION:  - Recommended goal rate for TF via J-tube when appropriate: Vital 1.2 @ 70 mL/hr to provide 2013 kcal, 126 grams of protein, and 1362 mL free water.  - RD will follow-up 10/25.  NUTRITION DIAGNOSIS:   Inadequate oral intake related to inability to eat as evidenced by NPO status. -ongoing  GOAL:   Patient will meet greater than or equal to 90% of their needs -unmet with current TF rate.  MONITOR:   Vent status, TF tolerance, Weight trends, Labs, I & O's  ASSESSMENT:   75 year old male presenting for a post-operative visit. Note for "Post-Operative": Patient returns one month status post robotic resection.  Distal gastrectomy with Billroth I gastroduodenal reconstruction for chronic ulcer causing obstruction.  09/25/2015  10/24 Pt was extubated late morning yesterday and was subsequently re-intubated ~2120 yesterday d/t poor ability to clear secretions. Estimated kcal need updated this AM and based on weight from 10/20 (95.8 kg); weight +1.4 kg since that time and is down from yesterday with Lasix order in place.   TF initiated via J-tube yesterday. Pt currently receiving Vital 1.2 @ 30 mL/hr via J-tube which is providing 864 kcal, 54 grams of protein, and 584 mL free water.  Per RN note from today at 0721, pt with green/brown bile-like liquid around gastric tube at 2030. G Tube currently to wall suction and no new orders given.   Per Dr. Ulyses Jarred note this AM, pt now off of pressors. Also per note, possible trach if pt does not improve.  Per Dr. Gordy Savers note yesterday at 1209: We'll start very low dose rate tube feeds.  Just 10 mL/ hour.  Follow clinically.  Advance to 44mL/hour the next day if doing well, then advance to goal once he starts having bowel movements.  Maybe start giving liquid meds through the jejunostomy tube in a few days if tolerates tube feeds, arguing  against any leak or concern.  Goal rate for TF outlined above.  Patient is currently intubated on ventilator support MV: 13.1 L/min Temp (24hrs), Avg:98.2 F (36.8 C), Min:98.1 F (36.7 C), Max:98.3 F (36.8 C) BP: 150/36 and MAP: 71  Medications reviewed; 10 mg Dulcolax/day, 40 mg IV Lasix BID, sliding scale Novolog, PRN Zofran, 40 mg IV Protonix/day.  Labs reviewed; CBGs: 91 mg/dL x2 readings this AM, Cl: 115 mmol/L, BUN: 45 mg/dL, Ca: 8.2 mg/dL, GFR: 56 mL/min.  Drip: Heparin @ 1300 units/hr.      10/23 - Pt with OGT. Pt remains intubated. - CBW consistent with weight from Friday and CBW of 99.2 kg used to re-estimate nutrition needs this AM.  - Per Surgery note this AM, plan for Fluoroscopy test today and if no leak, initiate TF at <30 mL/hr and can advance to goal rate after pressors weaned off.  - Plan to wean pressors to keep MAP >65. TF recommendations outlined above.  Patient is currently intubated on ventilator support MV: 13.1 L/min Temp (24hrs), Avg:98.2 F (36.8 C), Min:97.7 F (36.5 C), Max:98.7 F (37.1 C) BP: 114/49 and MAP: 72  IVF: NS @ 100 mL/hr.  Drips: Heparin @ 1300 units/hr, Levo @ 5 mcg/min.    10/20 - Pt remains intubated following surgery yesterday: ex lap with omentopexy of jejunal disruption and washout with placement of drains.  - Procedures done on 10/17: placement of G- and J-tubes and EGD with dilation of duodenal stricture, gastric biopsies.  -  Per Surgery note this AM, plan for J-tube study via Fluoroscopy 10/23 and if no leak present, can start trickle TF at that time.  - Spoke with pt's niece, who is at bedside. She is concerned about nutritional status with no nutrition support.  - Talked with her about previous TPN provision and TF yesterday.  - She states that she has not been able to talk with any one from the surgical team concerning plan for nutrition.  - Per chart review, weight -3.1 kg from yesterday.  - CBW used to  re-estimate nutrition needs.   Patient is currently intubated on ventilator support MV: 12.2L/min Temp (24hrs), Avg:97.4 F (36.3 C), Min:94.3 F (34.6 C), Max:98.9 F (37.2 C) Propofol: none BP: 153/23 and MAP: 66  IVF:NS @ 100 mL/hr. Drip:Precedex @ 0.7 mcg/kg/hr.     Diet Order:  Diet NPO time specified  Skin:  Wound (see comment) (Abdominal incision)  Last BM:  10/16  Height:   Ht Readings from Last 1 Encounters:  12/08/15 6\' 2"  (1.88 m)    Weight:   Wt Readings from Last 1 Encounters:  12/08/15 214 lb 4.6 oz (97.2 kg)    Ideal Body Weight:  80.9 kg  BMI:  Body mass index is 27.51 kg/m.  Estimated Nutritional Needs:   Kcal:  2037  Protein:  119-149 grams (1.2-1.5 grams/kg)  Fluid:  per Surgery/MD/NP given hyponatremia and renal function tests  EDUCATION NEEDS:   No education needs identified at this time     Danny GammonJessica Nayleah Gamel, MS, RD, LDN Inpatient Clinical Dietitian Pager # [phone] After hours/weekend pager # [phone]

## 2015-12-08 NOTE — Progress Notes (Signed)
I spoke with Dr. Michaell CowingGross.  I have recommended repeating the arterial duplex and based off these findings, make a recommendation for the duration of heparin  Wells Kirsi Hugh

## 2015-12-08 NOTE — Progress Notes (Signed)
eLink Physician-Brief Progress Note Patient Name: [name] DOB: 11/06/1940 MRN: [id number]   Date of Service  12/08/2015  HPI/Events of Note  CVP 8 Low urine output  eICU Interventions  Bolus 500cc IV NS     Intervention Category Evaluation Type: Other  [name] 12/08/2015, 1:04 AM

## 2015-12-08 NOTE — Progress Notes (Signed)
PCCM PROGRESS NOTE  Admission date: 11/24/2015 Consult date: 12/03/2015 Referring provider: Dr. Michaell CowingGross  CC: Abdominal pain  BRIEF: 75 y/o male with peptic ulcer disease admitted on 10/10 with recurrent gastric outlet obstruction secondary to gastritis admitted underwent G and J tube placement this admission.  On 10/19 had to go back to the OR after he removed his G tube accidentally leading to peritonitis, intubation, septic shock.  Subjective:  Extubated, re-intubated due to inability to handle secretions, pressors off   Vital signs: BP (!) 124/48   Pulse 96   Temp 98.3 F (36.8 C) (Axillary)   Resp 14   Ht 6\' 2"  (1.88 m)   Wt 214 lb 4.6 oz (97.2 kg)   SpO2 100%   BMI 27.51 kg/m   Intake/output: I/O last 3 completed shifts: In: 4966.4 [I.V.:3748.9; Other:90; NG/GT:665.5; IV Piggyback:462] Out: 3385 [Urine:1525; Drains:1860]   Vent settings: Vent Mode: PRVC FiO2 (%):  [30 %-60 %] 45 % Set Rate:  [14 bmp] 14 bmp Vt Set:  [660 mL] 660 mL PEEP:  [5 cmH20] 5 cmH20 Pressure Support:  [5 cmH20] 5 cmH20 Plateau Pressure:  [17 cmH20-18 cmH20] 17 cmH20  General: chronically ill appearing HENT: NCAT ETT in place PULM: CTA B with vent supported breaths CV: RRR, few irregular beats, no MGR GI: three abdominal drains in place, G and J tubes in place DERM: edema arms, legs bilaterally Neuro: Sleepy on vent, will arouse to voice and follow commands   CMP Latest Ref Rng & Units 12/08/2015 12/07/2015 12/06/2015  Glucose 65 - 99 mg/dL 161(W104(H) 960(A114(H) 540(J116(H)  BUN 6 - 20 mg/dL 81(X45(H) 91(Y46(H) 78(G49(H)  Creatinine 0.61 - 1.24 mg/dL 9.561.22 2.131.15 0.861.14  Sodium 135 - 145 mmol/L 141 137 135  Potassium 3.5 - 5.1 mmol/L 3.6 3.7 4.0  Chloride 101 - 111 mmol/L 115(H) 112(H) 109  CO2 22 - 32 mmol/L 20(L) 19(L) 19(L)  Calcium 8.9 - 10.3 mg/dL 7.2(L) 6.9(L) 7.0(L)  Total Protein 6.5 - 8.1 g/dL - - -  Total Bilirubin 0.3 - 1.2 mg/dL - - -  Alkaline Phos 38 - 126 U/L - - -  AST 15 - 41 U/L - - -  ALT 17  - 63 U/L - - -    CBC Latest Ref Rng & Units 12/08/2015 12/07/2015 12/06/2015  WBC 4.0 - 10.5 K/uL 18.1(H) 15.0(H) 21.2(H)  Hemoglobin 13.0 - 17.0 g/dL 10.3(L) 10.2(L) 10.7(L)  Hematocrit 39.0 - 52.0 % 30.3(L) 29.1(L) 30.7(L)  Platelets 150 - 400 K/uL 223 184 199    ABG    Component Value Date/Time   PHART 7.344 (L) 12/07/2015 2230   PCO2ART 31.4 (L) 12/07/2015 2230   PO2ART 132 (H) 12/07/2015 2230   HCO3 16.7 (L) 12/07/2015 2230   TCO2 26 12/04/2007 0638   ACIDBASEDEF 7.6 (H) 12/07/2015 2230   O2SAT 98.3 12/07/2015 2230    CBG (last 3)   Recent Labs  12/07/15 1935 12/07/15 2328 12/08/15 0344  GLUCAP 105* 97 91     Imaging: Dg Chest Port 1 View  Result Date: 12/08/2015 CLINICAL DATA:  Respiratory failure. EXAM: PORTABLE CHEST 1 VIEW COMPARISON:  12/07/2015. FINDINGS: Endotracheal to in stable position. Left subclavian line in stable position. Progressive atelectatic changes left lower lobe. Associated infiltrate cannot be excluded. No pleural effusion or pneumothorax. IMPRESSION: 1. Lines and tubes stable position. 2. Progressive atelectatic changes left lower lobe. Associated left lower lobe infiltrate cannot be excluded . Electronically Signed   By: Maisie Fushomas  Register   On:  12/08/2015 07:27   Portable Chest Xray  Result Date: 12/07/2015 CLINICAL DATA:  Decreased oxygen saturation EXAM: PORTABLE CHEST 1 VIEW COMPARISON:  12/07/2015 FINDINGS: AP portable semi-erect view of the chest. Endotracheal tube tip is approximately 4.9 cm superior to the carina. Hazy opacity at the left lung base, likely atelectasis. This finding is unchanged. Possible tiny left effusion. No new consolidation. Cardiomediastinal silhouette stable. No pneumothorax. Left-sided central venous catheter tip overlies the SVC. IMPRESSION: 1. Support lines and tubes as above. 2. No significant interval change in hazy atelectasis or infiltrate at the left base. Electronically Signed   By: Jasmine Pang M.D.   On:  12/07/2015 22:55   Dg Chest Port 1 View  Result Date: 12/07/2015 CLINICAL DATA:  Respiratory failure. EXAM: PORTABLE CHEST 1 VIEW COMPARISON:  12/05/2015. FINDINGS: Endotracheal tube left subclavian line in stable position. Heart size normal. Stable mild atelectasis left lung base. No pleural effusion or pneumothorax . IMPRESSION: 1. Lines and tubes in stable position. 2. Stable mild left base atelectasis. Electronically Signed   By: Maisie Fus  Register   On: 12/07/2015 07:32   Dg Abd Portable 1v  Result Date: 12/07/2015 CLINICAL DATA:  Evaluate percutaneous jejunostomy tube. EXAM: PORTABLE ABDOMEN - 1 VIEW COMPARISON:  12/02/2015 FINDINGS: There is oral contrast in the jejunum in the left upper quadrant. I do not see any definite leaking intraperitoneal contrast. There is some aching contrast laterally which appears to be in the abdominal wall. IMPRESSION: Leakage of contrast near the percutaneous site, likely in the abdominal wall. No definite intraperitoneal leakage of contrast. Electronically Signed   By: Rudie Meyer M.D.   On: 12/07/2015 11:59   Studies: 10/11 CT abdomen/pelvis> significant distension of the stomach with layering fluid and gas, NG present, worrisome for recurrent gastric outlet obstruction.  RLL consolidation, cholecycstectomy  Cultures: Blood 10/19 >>  Antibiotics: Zosyn 10/19 >> Anidulafungin 10/19 >>   Events: 10/10  admission 10/17  Lap placement of J & G tubes, EGD w/ balloon dilation of duodenal stricture, gastric biopsies, closure of gastrotomy 10/18  sundowning, pulled out G tube 10/19  laparaoscopy (findings gastric leak, bilious ascites, jejunal disruption), omentoplexy of jejunal disruption, gastric leak  Lines/tube: 10/19 ETT >> 10/24 then replaced 10/24 >  10/19 CVL left subclavian >>  10/19 R brachial arterial line >> 10/20 10/19 abdominal drain x3 >> 10/19 G tube >> 10/19 J tube >>  Summary: 75 yo male former smoker with progressive epigastric  pain, nausea, vomiting, bloating from gastric outlet obstruction with pyloric channel ulcer.  Had Billroth 1, vagotomy, fundoplication, omentopexy August 2017 with initial improvement.  Had recurrent symptoms September 2017 that become progressively worse with gastric dilation from recurrent stricture.  Had laparoscopic placement of jejunostomy tube, gastrostomy tube, and EGD balloon dilation of duodenal stricture 10/17.  Developed gastric leakage with peritonitis after pulling out PEG tube 10/19 and taken to OR.  PMHx of CAD s/p stent, GERD, HTN, HLD  Assessment/plan:  Acute respiratory failure > recurrent, mostly due to inability to handle secretions as lungs clear, CXR clear - pressure support wean as tolerated all day today - minimize sedating medications - f/u CXR - consider tracheostomy if no improvement  Septic shock 2nd to peritonitis > resolved Hx of CAD, HTN, HLD. - monitor blood pressure - continue IV fluids - hold outpt asa, lipitor, diovan  Recurrent gastric outlet obstruction Peritonitis Nutrition needs - post-op care, nutrition per CCS - Day 6 of zosyn, anidulafungin > plan 7 days  AKI >>  improved - optimize hemodynamics - monitor renal function, urine outpt  Acute metabolic encephalopathy > minimal - RASS goal 0 to -1 - d/c fentanyl gtt now, prn only - if need continuous sedation, need to start precedex  Rt index finger ischemia likely from Rt brachial a line. - VVS consulted 10/20, appreciate input - IV heparin   Hyperglycemia - SSI  Anasarca - lasix today  SUP - Protonix DVT prophylaxis - heparin gtt, SCDs Goals of care - Full code  Daughter updated at bedside 10/23.   My cc time 32 minutes  Heber [place], MD [facility] Pager: [phone] Cell: [phone] After 3pm or if no response, call [phone]

## 2015-12-09 ENCOUNTER — Inpatient Hospital Stay (HOSPITAL_COMMUNITY): Payer: Medicare Other

## 2015-12-09 DIAGNOSIS — Z931 Gastrostomy status: Secondary | ICD-10-CM

## 2015-12-09 DIAGNOSIS — M79621 Pain in right upper arm: Secondary | ICD-10-CM

## 2015-12-09 DIAGNOSIS — K2970 Gastritis, unspecified, without bleeding: Secondary | ICD-10-CM

## 2015-12-09 DIAGNOSIS — Z934 Other artificial openings of gastrointestinal tract status: Secondary | ICD-10-CM

## 2015-12-09 DIAGNOSIS — I998 Other disorder of circulatory system: Secondary | ICD-10-CM

## 2015-12-09 DIAGNOSIS — Z720 Tobacco use: Secondary | ICD-10-CM

## 2015-12-09 HISTORY — DX: Other disorder of circulatory system: I99.8

## 2015-12-09 LAB — HEPARIN LEVEL (UNFRACTIONATED): Heparin Unfractionated: 0.44 IU/mL (ref 0.30–0.70)

## 2015-12-09 LAB — GLUCOSE, CAPILLARY
GLUCOSE-CAPILLARY: 124 mg/dL — AB (ref 65–99)
Glucose-Capillary: 125 mg/dL — ABNORMAL HIGH (ref 65–99)
Glucose-Capillary: 126 mg/dL — ABNORMAL HIGH (ref 65–99)
Glucose-Capillary: 117 mg/dL — ABNORMAL HIGH (ref 65–99)
Glucose-Capillary: 91 mg/dL (ref 65–99)

## 2015-12-09 LAB — BASIC METABOLIC PANEL
ANION GAP: 6 (ref 5–15)
BUN: 45 mg/dL — ABNORMAL HIGH (ref 6–20)
CHLORIDE: 117 mmol/L — AB (ref 101–111)
CO2: 21 mmol/L — AB (ref 22–32)
Calcium: 7.3 mg/dL — ABNORMAL LOW (ref 8.9–10.3)
Creatinine, Ser: 1.64 mg/dL — ABNORMAL HIGH (ref 0.61–1.24)
GFR calc Af Amer: 45 mL/min — ABNORMAL LOW (ref >60)
GFR calc non Af Amer: 39 mL/min — ABNORMAL LOW (ref >60)
GLUCOSE: 132 mg/dL — AB (ref 65–99)
POTASSIUM: 3.4 mmol/L — AB (ref 3.5–5.1)
Sodium: 144 mmol/L (ref 135–145)

## 2015-12-09 LAB — CBC WITH DIFFERENTIAL/PLATELET
BASOS ABS: 0 10*3/uL (ref 0.0–0.1)
BASOS PCT: 0 %
EOS PCT: 0 %
Eosinophils Absolute: 0 10*3/uL (ref 0.0–0.7)
HEMATOCRIT: 25.6 % — AB (ref 39.0–52.0)
HEMOGLOBIN: 8.6 g/dL — AB (ref 13.0–17.0)
LYMPHS PCT: 7 %
Lymphs Abs: 1.3 10*3/uL (ref 0.7–4.0)
MCH: 30.1 pg (ref 26.0–34.0)
MCHC: 33.6 g/dL (ref 30.0–36.0)
MCV: 89.5 fL (ref 78.0–100.0)
MONOS PCT: 4 %
Monocytes Absolute: 0.7 10*3/uL (ref 0.1–1.0)
NEUTROS ABS: 16.2 10*3/uL — AB (ref 1.7–7.7)
Neutrophils Relative %: 89 %
Platelets: 283 10*3/uL (ref 150–400)
RBC: 2.86 MIL/uL — ABNORMAL LOW (ref 4.22–5.81)
RDW: 14.6 % (ref 11.5–15.5)
WBC: 18.2 10*3/uL — ABNORMAL HIGH (ref 4.0–10.5)

## 2015-12-09 LAB — HEPATIC FUNCTION PANEL
ALK PHOS: 120 U/L (ref 38–126)
ALT: 21 U/L (ref 17–63)
AST: 24 U/L (ref 15–41)
Albumin: 1.4 g/dL — ABNORMAL LOW (ref 3.5–5.0)
BILIRUBIN DIRECT: 0.5 mg/dL (ref 0.1–0.5)
BILIRUBIN TOTAL: 1.1 mg/dL (ref 0.3–1.2)
Indirect Bilirubin: 0.6 mg/dL (ref 0.3–0.9)
Total Protein: 4.3 g/dL — ABNORMAL LOW (ref 6.5–8.1)

## 2015-12-09 LAB — CULTURE, BLOOD (ROUTINE X 2)
CULTURE: NO GROWTH
Culture: NO GROWTH

## 2015-12-09 LAB — PHOSPHORUS: Phosphorus: 4 mg/dL (ref 2.5–4.6)

## 2015-12-09 LAB — MAGNESIUM: MAGNESIUM: 2.2 mg/dL (ref 1.7–2.4)

## 2015-12-09 LAB — HEMOGLOBIN AND HEMATOCRIT, BLOOD
HCT: 23.2 % — ABNORMAL LOW (ref 39.0–52.0)
HEMOGLOBIN: 8 g/dL — AB (ref 13.0–17.0)

## 2015-12-09 MED ORDER — SUCRALFATE 1 GM/10ML PO SUSP
1.0000 g | Freq: Four times a day (QID) | ORAL | Status: AC
  Administered 2015-12-11 – 2015-12-19 (×30): 1 g
  Filled 2015-12-09 (×29): qty 10

## 2015-12-09 MED ORDER — TRACE MINERALS CR-CU-MN-SE-ZN 10-1000-500-60 MCG/ML IV SOLN
INTRAVENOUS | Status: AC
  Administered 2015-12-09: 18:00:00 via INTRAVENOUS
  Filled 2015-12-09: qty 960

## 2015-12-09 MED ORDER — PANTOPRAZOLE SODIUM 40 MG PO PACK: 40.0000 mg | PACK | Freq: Two times a day (BID) | ORAL | Status: DC

## 2015-12-09 MED ORDER — FAMOTIDINE IN NACL 20-0.9 MG/50ML-% IV SOLN
20.0000 mg | INTRAVENOUS | Status: DC
  Administered 2015-12-09 – 2015-12-12 (×4): 20 mg via INTRAVENOUS
  Filled 2015-12-09 (×4): qty 50

## 2015-12-09 MED ORDER — NICOTINE 21 MG/24HR TD PT24
21.0000 mg | MEDICATED_PATCH | Freq: Every day | TRANSDERMAL | Status: DC
  Administered 2015-12-09 – 2015-12-24 (×16): 21 mg via TRANSDERMAL
  Filled 2015-12-09 (×17): qty 1

## 2015-12-09 MED ORDER — LACTULOSE 10 GM/15ML PO SOLN
20.0000 g | Freq: Once | ORAL | Status: DC
  Filled 2015-12-09: qty 30

## 2015-12-09 MED ORDER — FAT EMULSION 20 % IV EMUL
240.0000 mL | INTRAVENOUS | Status: AC
  Administered 2015-12-09: 240 mL via INTRAVENOUS
  Filled 2015-12-09: qty 250

## 2015-12-09 MED ORDER — DEXMEDETOMIDINE HCL IN NACL 200 MCG/50ML IV SOLN
0.0000 ug/kg/h | INTRAVENOUS | Status: DC
  Administered 2015-12-09: 0.3 ug/kg/h via INTRAVENOUS
  Administered 2015-12-09: 1.2 ug/kg/h via INTRAVENOUS
  Administered 2015-12-10: 0.3 ug/kg/h via INTRAVENOUS
  Administered 2015-12-10: 1.2 ug/kg/h via INTRAVENOUS
  Administered 2015-12-10: 0.9 ug/kg/h via INTRAVENOUS
  Administered 2015-12-10: 1.2 ug/kg/h via INTRAVENOUS
  Administered 2015-12-10: 0.1 ug/kg/h via INTRAVENOUS
  Administered 2015-12-11: 0.2 ug/kg/h via INTRAVENOUS
  Filled 2015-12-09 (×3): qty 50
  Filled 2015-12-09 (×2): qty 100

## 2015-12-09 MED ORDER — SODIUM CHLORIDE 0.9 % IV SOLN
100.0000 mg | INTRAVENOUS | Status: AC
  Administered 2015-12-09 – 2015-12-10 (×2): 100 mg via INTRAVENOUS
  Filled 2015-12-09 (×2): qty 100

## 2015-12-09 MED ORDER — VITAL AF 1.2 CAL PO LIQD
1000.0000 mL | ORAL | Status: DC
  Filled 2015-12-09: qty 1000

## 2015-12-09 NOTE — Progress Notes (Signed)
[facility] NOTE   Pharmacy Consult for TPN  Patient Measurements: Height: 6' 2" (188 cm) Weight: 204 lb 9.4 oz (92.8 kg) IBW/kg (Calculated) : 82.2 TPN AdjBW (KG): 97.2 Body mass index is 26.27 kg/m.  Insulin Requirements: 2 units Novolog in past 24 hours  Current Nutrition: NPO  IVF: NS at 10 ml/hr  Central access: PICC placed 10/12, pt pulled out 10/19. CVC in place since 10/19 TPN date: 10/12-10/19, resumed 10/25  ASSESSMENT                                                                                                          HPI: 50 yoM admitted 10/9 with new upper abdominal discomfort and bloating with concern for recurrent GOO due to chronic pyloric stricture. 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  Pharmacy consulted to start TPN for gastric ileus and obstruction 10/12. Patient underwent G and J tube placement this admission. On 10/19, patient pulled out PICC line. He also had to go back to OR after he removed his G tube accidentally, leading to peritonitis, intubation, and septic shock. Tube feed rate slowly advanced by CCS, but today patient noted to have dark brown output around J tube site, and tube feeds held. Pharmacy asked to resume TPN.   Today, 12/09/15:   Glucose - No hx DM noted. CBGs at goal < 150.   Electrolytes - K+ slightly low at 3.4, Cl- high at 117, all other lytes, including Corrected Ca WNL  Renal - AKI, SCr up to 1.64 today; I/O: 1602/5625  LFTs - AST/ALT, Alk Phos, Tbili WNL  TGs -76 (10/13), 97 (10/16)  Prealbumin - 17.8 (10/11), 12.8 (10/13), 11 (10/16), 12.5 (10/18), < 5 (10/23)  NUTRITIONAL GOALS                                                                                             RD recs (as of 10/25): [id number] Protein:111-139 grams (1.2-1.5 grams/kg)  Clinimix 5/15 at a goal rate of 24m/hr +108g/day protein,  2013Kcal/day.  **There is currently a nPsychologist, prison and probation servicesof Clinimix solution. No 2L bags of Clinimix E 5/20 currently available. To conserve supply, will consider goal rate of Clinimix 5/15 at 83 ml/hr + 20% fat emulsion at 177mhr, which will keep total volume at 2L rather than increasing use to 3L.   PLAN  At 1800 today:  Start Clinimix E 5/15 at 40 ml/hr.   20% fat emulsion at 10 ml/hr.  TPN to contain standard multivitamins and trace elements.  Plan to advance as tolerated to the goal rate.   If Cl- remains high tomorrow, will consider changing to electrolyte-free formulation.   IVF per MD.  Continue sensitive SSI q4h.   TPN lab panels in AM and on Mondays & Thursdays.  F/u daily.    , PharmD, BCPS Pager: [phone] 12/09/2015 12:56 PM     

## 2015-12-09 NOTE — Progress Notes (Signed)
*  PRELIMINARY RESULTS* Vascular Ultrasound Right Upper Extremity Arterial Duplex has been completed.   All right upper extremity arteries were visualized and found to be patent with multiphasic flow. There is no evidence of thrombus involving any of the right upper extremity arteries. The previously visualized thrombus in the right brachial artery was not seen, suggestive of resolution. Allen's test was performed on the right palmar arch- signal was unaffected with radial compression, and obliterated with ulnar compression.  12/09/2015 11:11 AM Gertie FeyMichelle Jihaad Bruschi, BS, RVT, RDCS, RDMS

## 2015-12-09 NOTE — Progress Notes (Signed)
Nutrition Follow-up  DOCUMENTATION CODES:   Severe malnutrition in context of acute illness/injury  INTERVENTION:  - RD will follow-up 10/26.  NUTRITION DIAGNOSIS:   Inadequate oral intake related to inability to eat as evidenced by NPO status. -ongoing  GOAL:   Patient will meet greater than or equal to 90% of their needs -unmet with TF now on hold.  MONITOR:   Vent status, Weight trends, Labs, Skin, I & O's, Other (Comment) (Ability to restart TF)  ASSESSMENT:   75 year old male presenting for a post-operative visit. Note for "Post-Operative": Patient returns one month status post robotic resection.  Distal gastrectomy with Billroth I gastroduodenal reconstruction for chronic ulcer causing obstruction.  09/25/2015  10/25 Notes from Dr. Michaell CowingGross and CCM NP this AM reviewed. G-tube currently to suction with 300cc drainage present in canister and J-tube to gravity. Spoke with RN at bedside who reports TF via J-tube turned off and G- and J-tubes adjusted accordingly based on fecal-appearing matter around tubing approximately 40 minutes ago.   Estimated nutrition needs updated this AM and based on CBW as weight trending down with Lasix. Will follow-up tomorrow to monitor for POC. No PICC in place and plan to replace only if unable to provide TF for 7 days.   Patient is currently intubated on ventilator support with OGT in place.  MV: 11.3 L/min Temp (24hrs), Avg:97.9 F (36.6 C), Min:96.9 F (36.1 C), Max:98.7 F (37.1 C) BP: 92/35 and MAP: 48  Medications reviewed; 10 mg Dulcolax/day, 20 mg IV Pepcid/day, sliding scale Novolog, 20 mg lactulose x1 dose today, PRN IV Zofarn, 40 mg Protonix per OGT BID, 1 g Carafate QID. Labs reviewed; CBGs: 124 and 125 mg/dL this AM, K: 3.4 mmol/L, Cl: 117 mmol/L, BUN: 45 mg/dL, creatinine: 1.611.64 mg/dL, Ca: 7.3 mg/dL, GFR: 39 mL/min.   Drips: Heparin @ 1300 units/hr, Precedex @ 0.2 mcg/kg/hr.    10/24 - Pt was extubated late morning yesterday and  was subsequently re-intubated ~2120 yesterday d/t poor ability to clear secretions.  - Estimated kcal need updated this AM and based on weight from 10/20 (95.8 kg); weight +1.4 kg since that time and is down from yesterday with Lasix order in place.  - TF initiated via J-tube yesterday.  - Pt currently receiving Vital 1.2 @ 30 mL/hr via J-tube which is providing 864 kcal, 54 grams of protein, and 584 mL free water.  - Per RN note from today at 0721, pt with green/brown bile-like liquid around gastric tube at 2030. G Tube currently to wall suction and no new orders given. - Per Dr. Ulyses JarredMcQuaid's note this AM, pt now off of pressors. Also per note, possible trach if pt does not improve.   - Per Dr. Gordy SaversGross's note yesterday at 1209: We'll start very low dose rate tube feeds. Just 10 mL/ hour. Follow clinically. Advance to 7430mL/hour the next day if doing well, then advance to goal once hestarts having bowel movements. Maybe start giving liquid meds through the jejunostomy tube in a few days if tolerates tube feeds, arguing against any leak or concern.  - Goal rate for TF via J-tube: Vital 1.2 @ 70 mL/hr to provide 2013 kcal, 126 grams of protein, and 1362 mL free water.   Patient is currently intubated on ventilator support MV: 13.1 L/min Temp (24hrs), Avg:98.2 F (36.8 C), Min:98.1 F (36.7 C), Max:98.3 F (36.8 C) BP: 150/36 and MAP: 71 Drip: Heparin @ 1300 units/hr.    10/23 - Pt with OGT. Pt  remains intubated. - CBW consistent with weight from Friday and CBW of 99.2 kg used to re-estimate nutrition needs this AM.  - Per Surgery note this AM, plan for Fluoroscopy test today and if no leak, initiate TF at <30 mL/hr and can advance to goal rate after pressors weaned off.  - Plan to wean pressors to keep MAP >65. TF recommendations outlined above.  Patient is currently intubated on ventilator support MV: 13.1L/min Temp (24hrs), Avg:98.2 F (36.8 C), Min:97.7 F (36.5 C), Max:98.7 F  (37.1 C) BP: 114/49 and MAP: 72 IVF: NS @ 100 mL/hr.  Drips: Heparin @ 1300 units/hr, Levo @ 5 mcg/min.     Diet Order:  Diet NPO time specified  Skin:  Wound (see comment) (Abdominal incision)  Last BM:  10/24  Height:   Ht Readings from Last 1 Encounters:  12/08/15 6\' 2"  (1.88 m)    Weight:   Wt Readings from Last 1 Encounters:  12/09/15 204 lb 9.4 oz (92.8 kg)    Ideal Body Weight:  80.9 kg  BMI:  Body mass index is 26.27 kg/m.  Estimated Nutritional Needs:   Kcal:  1998  Protein:  111-139 grams (1.2-1.5 grams/kg)  Fluid:  per Surgery/MD/NP given hyponatremia and renal function tests  EDUCATION NEEDS:   No education needs identified at this time    Trenton Gammon, MS, RD, LDN Inpatient Clinical Dietitian Pager # [phone] After hours/weekend pager # [phone]

## 2015-12-09 NOTE — Progress Notes (Signed)
eLink Physician-Brief Progress Note Patient Name: [name] DOB: Feb 24, 1940 MRN: [id number]   Date of Service  12/09/2015  HPI/Events of Note  Multiple issues: 1. Delirium/Agitation - not impoved with Fentanyl, Morphine and Versed IV pushes and 2. History of Tobacco Abuse (uses chewing tobacco).  eICU Interventions  Will order: 1. Restart Precedex IV infusion. Will decrease starting dose to 0.2 mcg/kg/hour. Titrate to RASS = 0. 2. Nicotine Patch 21 mg Q day.      Intervention Category Major Interventions: Delirium, psychosis, severe agitation - evaluation and management Intermediate Interventions: Other:  [name] 12/09/2015, 12:16 AM

## 2015-12-09 NOTE — Progress Notes (Signed)
PCCM PROGRESS NOTE  Admission date: 11/24/2015 Consult date: 12/03/2015 Referring provider: Dr. Michaell CowingGross  CC: Abdominal pain  BRIEF:  75 y/o male with peptic ulcer disease admitted on 10/10 with recurrent gastric outlet obstruction secondary to gastritis admitted underwent G and J tube placement this admission.  On 10/19 had to go back to the OR after he removed his G tube accidentally leading to peritonitis, intubation, septic shock.  Subjective:  New drainage from J tube Vital signs: BP (!) 128/31   Pulse 80   Temp 97.2 F (36.2 C) (Axillary)   Resp 17   Ht 6\' 2"  (1.88 m)   Wt 204 lb 9.4 oz (92.8 kg)   SpO2 100%   BMI 26.27 kg/m   Intake/output: I/O last 3 completed shifts: In: 2708.8 [I.V.:1581.8; Other:20; NG/GT:777; IV Piggyback:330] Out: 6560 [Urine:4175; Drains:2385]   Vent settings: Vent Mode: PSV FiO2 (%):  [35 %-40 %] 35 % Set Rate:  [14 bmp] 14 bmp Vt Set:  [660 mL] 660 mL PEEP:  [5 cmH20] 5 cmH20 Pressure Support:  [5 cmH20] 5 cmH20 Plateau Pressure:  [17 cmH20-18 cmH20] 18 cmH20  General: chronically ill appearing, appears anxious  HENT: NCAT ETT in place, copious oral secretions PULM: scattered rhonchi w/ cough; vent supported breaths; pulling 500-800 ml Vts CV: RRR, few irregular beats, no MGR GI: three abdominal drains in place, G and J tubes in place; there is dark purulent almost feculent appearing drainage from the J-TUBE site.  DERM: edema arms, legs bilaterally Neuro: Sleepy on vent, will arouse to voice and follow commands   CMP Latest Ref Rng & Units 12/09/2015 12/08/2015 12/07/2015  Glucose 65 - 99 mg/dL 161(W132(H) 960(A104(H) 540(J114(H)  BUN 6 - 20 mg/dL 81(X45(H) 91(Y45(H) 78(G46(H)  Creatinine 0.61 - 1.24 mg/dL 9.56(O1.64(H) 1.301.22 8.651.15  Sodium 135 - 145 mmol/L 144 141 137  Potassium 3.5 - 5.1 mmol/L 3.4(L) 3.6 3.7  Chloride 101 - 111 mmol/L 117(H) 115(H) 112(H)  CO2 22 - 32 mmol/L 21(L) 20(L) 19(L)  Calcium 8.9 - 10.3 mg/dL 7.3(L) 7.2(L) 6.9(L)  Total Protein 6.5 -  8.1 g/dL - - -  Total Bilirubin 0.3 - 1.2 mg/dL - - -  Alkaline Phos 38 - 126 U/L - - -  AST 15 - 41 U/L - - -  ALT 17 - 63 U/L - - -    CBC Latest Ref Rng & Units 12/09/2015 12/08/2015 12/07/2015  WBC 4.0 - 10.5 K/uL 18.2(H) 18.1(H) 15.0(H)  Hemoglobin 13.0 - 17.0 g/dL 7.8(I8.6(L) 10.3(L) 10.2(L)  Hematocrit 39.0 - 52.0 % 25.6(L) 30.3(L) 29.1(L)  Platelets 150 - 400 K/uL 283 223 184   ABG    Component Value Date/Time   PHART 7.344 (L) 12/07/2015 2230   PCO2ART 31.4 (L) 12/07/2015 2230   PO2ART 132 (H) 12/07/2015 2230   HCO3 16.7 (L) 12/07/2015 2230   TCO2 26 12/04/2007 0638   ACIDBASEDEF 7.6 (H) 12/07/2015 2230   O2SAT 98.3 12/07/2015 2230   CBG (last 3)   Recent Labs  12/08/15 2334 12/09/15 0329 12/09/15 0752  GLUCAP 118* 124* 125*   Imaging: Dg Chest Port 1 View  Result Date: 12/09/2015 CLINICAL DATA:  Respiratory failure. EXAM: PORTABLE CHEST 1 VIEW COMPARISON:  12/08/2015. FINDINGS: Endotracheal tube and left subclavian line stable position. Heart size stable. Persistent atelectatic changes left lung base. Associated left lower lobe infiltrate cannot be excluded. No prominent pleural effusion or pneumothorax. IMPRESSION: 1. Lines and tubes in stable position. 2. Persistent atelectatic changes left lung base. Mild  left base infiltrate cannot be excluded. No change from prior exam . Electronically Signed   By: Maisie Fus  Register   On: 12/09/2015 07:04   Dg Chest Port 1 View  Result Date: 12/08/2015 CLINICAL DATA:  Respiratory failure. EXAM: PORTABLE CHEST 1 VIEW COMPARISON:  12/07/2015. FINDINGS: Endotracheal to in stable position. Left subclavian line in stable position. Progressive atelectatic changes left lower lobe. Associated infiltrate cannot be excluded. No pleural effusion or pneumothorax. IMPRESSION: 1. Lines and tubes stable position. 2. Progressive atelectatic changes left lower lobe. Associated left lower lobe infiltrate cannot be excluded . Electronically Signed    By: Maisie Fus  Register   On: 12/08/2015 07:27   Portable Chest Xray  Result Date: 12/07/2015 CLINICAL DATA:  Decreased oxygen saturation EXAM: PORTABLE CHEST 1 VIEW COMPARISON:  12/07/2015 FINDINGS: AP portable semi-erect view of the chest. Endotracheal tube tip is approximately 4.9 cm superior to the carina. Hazy opacity at the left lung base, likely atelectasis. This finding is unchanged. Possible tiny left effusion. No new consolidation. Cardiomediastinal silhouette stable. No pneumothorax. Left-sided central venous catheter tip overlies the SVC. IMPRESSION: 1. Support lines and tubes as above. 2. No significant interval change in hazy atelectasis or infiltrate at the left base. Electronically Signed   By: Jasmine Pang M.D.   On: 12/07/2015 22:55   Dg Abd Portable 1v  Result Date: 12/07/2015 CLINICAL DATA:  Evaluate percutaneous jejunostomy tube. EXAM: PORTABLE ABDOMEN - 1 VIEW COMPARISON:  12/02/2015 FINDINGS: There is oral contrast in the jejunum in the left upper quadrant. I do not see any definite leaking intraperitoneal contrast. There is some aching contrast laterally which appears to be in the abdominal wall. IMPRESSION: Leakage of contrast near the percutaneous site, likely in the abdominal wall. No definite intraperitoneal leakage of contrast. Electronically Signed   By: Rudie Meyer M.D.   On: 12/07/2015 11:59  PCXR w/ minimal L base atx. Aeration improved.   Studies: 10/11 CT abdomen/pelvis> significant distension of the stomach with layering fluid and gas, NG present, worrisome for recurrent gastric outlet obstruction.  RLL consolidation, cholecycstectomy  Cultures: Blood 10/19 >>  Antibiotics: Zosyn 10/19 >> Anidulafungin 10/19 >> 10/26 (stop date)  Events: 10/10  admission 10/17  Lap placement of J & G tubes, EGD w/ balloon dilation of duodenal stricture, gastric biopsies, closure of gastrotomy 10/18  sundowning, pulled out G tube 10/19  laparaoscopy (findings gastric  leak, bilious ascites, jejunal disruption), omentoplexy of jejunal disruption, gastric leak 10/24 extubated. Required re-intubation.  10/25: placed on precedex for acute encephalopathy; new drainage from J tube. tubefeeds stopped.    Lines/tube: 10/19 ETT >> 10/24 then replaced 10/24 >  10/19 CVL left subclavian >>  10/19 R brachial arterial line >> 10/20 10/19 abdominal drain x3 >> 10/19 G tube >> 10/19 J tube >>  Summary: 75 yo male former smoker with progressive epigastric pain, nausea, vomiting, bloating from gastric outlet obstruction with pyloric channel ulcer.  Had Billroth 1, vagotomy, fundoplication, omentopexy August 2017 with initial improvement.  Had recurrent symptoms September 2017 that become progressively worse with gastric dilation from recurrent stricture.  Had laparoscopic placement of jejunostomy tube, gastrostomy tube, and EGD balloon dilation of duodenal stricture 10/17.  Developed gastric leakage with peritonitis after pulling out PEG tube 10/19 and taken to OR. Now back on vent after self extubation on 10/24. Major barrier appeared to be delirium and ability handle secretions, deconditioning and malnutrition. This AM his J tube site has what appears to be feculent/purulent drainage (which  is new). Will hold weaning for now until surgical team can re-assess. We will need to decide on another trial of extubation OR trach soon.   PMHx of CAD s/p stent, GERD, HTN, HLD  Assessment/plan:  Acute respiratory failure > recurrent, mostly due to inability to handle secretions as lungs clear, CXR remains essentially clear - hold weaning for now given worsening abd exam  - minimize sedating medications - f/u CXR - consider tracheostomy if no improvement  Septic shock 2nd to peritonitis > resolved Hx of CAD, HTN, HLD. - monitor blood pressure - MAP goal > 65/ CVP >8 - continue IV fluids - hold outpt asa, lipitor, diovan  Recurrent gastric outlet  obstruction Peritonitis Nutrition needs - post-op care, nutrition per [facility] - holding tubefeeds - Day 7 of zosyn, anidulafungin > plan 7 days (will d/w Dr Kendrick Fries; will stop antifungal; but given on-going leukocytosis & the feculent/purulent dc from the G tube site will cont zosyn)  AKI >> improved initially. Now scr climbing: suspect that this hemodynamically mediated.  - optimize hemodynamics - monitor renal function, urine outpt - avoid hypotension  - renal adjust meds   Acute metabolic encephalopathy > minimal - RASS goal -1 - cont precedex - PRN fent - may need levo to support BP w/ precedex - scheduled tylenol   Rt index finger ischemia likely from Rt brachial a line. - VVS consulted 10/20, appreciate input - IV heparin  - plan to repeat arterial duplex to decide on duration of heparin   Hyperglycemia - SSI  Anasarca - holding further lasix given cr bump.   SUP - Protonix DVT prophylaxis - heparin gtt, SCDs Goals of care - Full code  CCM time 30 minutes  Simonne Martinet ACNP-BC [facility] Pulmonary/Critical Care Pager # [phone] OR # [phone] if no answer

## 2015-12-09 NOTE — Progress Notes (Signed)
eLink Physician-Brief Progress Note Patient Name: Danny GalRalph T Wollschlager DOB: July 24, 1940 MRN: [id number]   Date of Service  12/09/2015  HPI/Events of Note  Patient pulled ETT out 2-3 cm.   eICU Interventions  Will order: 1. Advance ETT 2.5 cm and re-secure. 2. Portable CXR post advancing ETT      Intervention Category Intermediate Interventions: Other: Minor Interventions: Agitation / anxiety - evaluation and management  Steffany Schoenfelder [place] 12/09/2015, 11:16 PM

## 2015-12-09 NOTE — Progress Notes (Addendum)
[facility]  [address] [place], [place] [address] Phone: [phone] FAX: [phone]   [name] [id number] 20-Sep-1940  CARE TEAM:  PCP: Irven Shelling, MD  Outpatient Care Team: Patient Care Team: Lavone Orn, MD as PCP - General (Internal Medicine) Michael Boston, MD as Consulting Physician (General Surgery) Arta Silence, MD as Consulting Physician (Gastroenterology)  Inpatient Treatment Team: Treatment Team: Attending Provider: Michael Boston, MD; Consulting Physician: Arta Silence, MD; Technician: Sueanne Margarita, NT; Consulting Physician: Md Pccm, MD; Rounding Team: Md Pccm, MD; Consulting Physician: Serafina Mitchell, MD; Registered Nurse: Sheron Nightingale, RN; Registered Nurse: Meredith Mody, RN; Registered Nurse: Lynnell Dike, RN; Registered Nurse: Benny Lennert, RN  SURGERY 12/03/2015  POST-OPERATIVE DIAGNOSIS:    Peritonitis Jejunal disruption Gastric leak  PROCEDURE:    LAPAROSCOPY DIAGNOSTIC OMENTOPEXY of jejunal disruption [place] OUT x 21L with drains placement  SURGEON:  Michael Boston, MD   Post-Op 12/01/2015  POST-OPERATIVE DIAGNOSIS:    RECURRENT GASTRIC OUTLET OBSTRUCTION DUE to significant edema at gastroduodenal anastomosis. GASTRITIS Failure to thrive with malnutrition  PROCEDURE:   LAPAROSCOPIC PLACEMENT OF FEEDING JEJUNOSTOMY AND GASTROSTOMY TUBEs ESOPHAGOGASTRODUODENOSCOPY (EGD) BALLOON DILATION OF DUODENAL STRICTURE Gastric biopsies  Closure of gastrotomy  SURGEON:  Michael Boston, MD  Prior surgery 09/25/2015  POST-OPERATIVE DIAGNOSIS:  Partial gastric outlet obstruction due to chronic pyloric stricture  PROCEDURE:   XI ROBOTIC distal gastrectomy BILROTH I  ANASTOMOSIS  ANTERIOR AND POSTERIOR VAGOTOMY DOR (Anterior 594 degree) FUNDIPLICATION OMENTOPEXY UPPER ENDOSCOPY  SURGEON:  Michael Boston, MD   Problem List:   Principal Problem:   Bile  peritonitis due to gastric tube dislodgement Active Problems:   Essential hypertension, benign   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 09/25/2015   Hyperlipidemia   Anxiety state   Gastric outlet obstruction   Gastric Ileus    Protein-calorie malnutrition, moderate (HCC)   Encephalopathy acute   Endotracheally intubated   On mechanically assisted ventilation (HCC)   Protein-calorie malnutrition, severe   Acute respiratory failure with hypoxia (HCC)    Assessment/Plan:  GUARDED s/p patching at jejunum & resecuring Gtube & massive washout  -diuresis as tolerated to help with secretions/vent.  Inc Cr & --I&O.  Back off diuretics for now  -inc TF gradually.  If cannot tolerate, stop TFs x 1 week & start TNA  -Gtube to LIWS for ileus & inflammatory narrowing at gastroduodenal Bilroth I anastomosis.  Skin care.  PPI, H2B for gastriris.  F/u Bx's for Hpylori neg.  Add carafate via Gtube as tolerated  Diagnosis Stomach, biopsy - CHRONIC FOCALLY ACTIVE GASTRITIS. - THERE IS NO EVIDENCE OF HELICOBACTER PYLORI, DYSPLASIA OR MALIGNANCY.   -wean vent as tolerated.  D/w PCCM Dr [place].  Pt may need trach within the week if still an issue with sedation & secretions  -try standing tylenol for pain.    Gradually use Jtube liquid meds if tolerates TFs  -Zosyn/antifungal IV x 7 days minimum.  shock less, & drains much more serous are hopeful signs  -Since he pulled out his PICC line, hold off on replacement and restarting TNA if cannot tolerate TFs  -heparin for brachial artery embolus to right index finger.  Looks better.  D/w Dr. Trula Slade with vascular surgery.  Plan to recheck blood supply  -Sedation/Anxiolysis - challenge with pt that goes from sleeping to pulling at lines/tubes.  mits for now.   Consider transitioning from Xanax and benzos and utilizing haldol/zyprexa/seroquel  Sundowning precautions.  Back on Precidex.  SUNDOWNING PRECAUTIONS   VTE prophylaxis  - SCDs  Nicotene patch - apparently dips (pt denied tob to me in past but I guess family revealed this)  Disposition: He will definitetly require skilled care facility with rehabilitation capacity upon discharge from the hospital.  I suspect it's going to take at least 2 weeks if not several before he'll be ready for that transition.  His daughter expressed understanding and appreciation   I discussed operative findings, updated the patient's status, discussed probable steps to recovery, and gave postoperative recommendations to the patient and nurse.  Recommendations were made.  Questions were answered.  They expressed understanding & appreciation.    [name], M.D., F.A.C.S. Gastrointestinal and Minimally Invasive Surgery [facility] [address], [place], [place] [address] [phone] Main / Paging    12/09/2015   Subjective:  Agitated intermittently.  Back on Precidex. Alert & nodding this AM.  Calm Min vent settings Off pressors No more leaking around G tube - No leaking around [facility] ICU nurses in room No family in room yet today  Objective:  Vital signs:  Vitals:   12/09/15 0445 12/09/15 0500 12/09/15 0510 12/09/15 0600  BP: (!) 114/34 (!) 102/19 (!) 112/35 (!) 135/33  Pulse:      Resp: _0 (!) 22  Temp:      TempSrc:      SpO2: 100% 100% 100% 100%  Weight:      Height:        Last BM Date: 12/08/15  Intake/Output   Yesterday:  10/24 0701 - 10/25 0700 In: 1602.4 [I.V.:645.4; NG/GT:657; IV Piggyback:280] Out: 3254 [Urine:3625; Drains:2000] This shift:  No intake/output data recorded.  Bowel function:  Flatus: YES  BM:  YES  Drain: Gtube thick dark old blood  Blake drains all serous - no bile tinge    Physical Exam:  General: Pt awake/alert.  Answering Y/N ?s.  Following commands but occ confused.   No acute distress at best.   Eyes: PERRL, normal EOM.  Sclera clear.  No icterus Neuro: CN  II-XII intact w/o focal sensory/motor deficits. Lymph: No head/neck/groin lymphadenopathy Psych:  No delerium/psychosis/paranoia HENT: Normocephalic, Mucus membranes moist.  No thrush. ETT in place Neck: Supple, No tracheal deviation Chest: CTAB. Chest wall pain w good excursion CV:  Pulses intact.  Regular rhythm MS: Normal AROM mjr joints.  No obvious deformity Abdomen:  G tube LUQ to LIWS old blood.  Rash around skin but dry, improved - dressings clean around it.  J tube in LLQ with 10/fr TFs - skin clean  Softer.  Moderately distended.  Tenderness at Tube sites and lower abdomen..  No diffuse peritonitis but less TTP GU:  Foley in place.  NEMG Ext:  SCDs BLE.  No mjr edema.  Right index finger unchanged - distal pad & nail bed with mild cyanosis.  Good cap refill to nail bed  Skin: 1+ anasarca.  No other petechiae / purpura  Results:   Diagnosis Stomach, biopsy - CHRONIC FOCALLY ACTIVE GASTRITIS. - THERE IS NO EVIDENCE OF HELICOBACTER PYLORI, DYSPLASIA OR MALIGNANCY. - SEE COMMENT. Microscopic Comment A Warthin-Starry stain is negative for the presence of Helicobacter pylori organisms. (JBK:gt, 12/04/15) Enid Cutter MD Pathologist, Electronic Signature (Case signed 12/04/2015) Specimen  and Clinical Information Specimen(s) Obtained: Stomach, biopsy Specimen Clinical  Labs: Results for orders placed or performed during the hospital encounter of 11/24/15 (from the past 48  hour(s))  Glucose, capillary     Status: Abnormal   Collection Time: 12/07/15  7:49 AM  Result Value Ref Range   Glucose-Capillary 103 (H) 65 - 99 mg/dL  Glucose, capillary     Status: None   Collection Time: 12/07/15 11:10 AM  Result Value Ref Range   Glucose-Capillary 97 65 - 99 mg/dL  Glucose, capillary     Status: None   Collection Time: 12/07/15  3:48 PM  Result Value Ref Range   Glucose-Capillary 95 65 - 99 mg/dL  Magnesium     Status: None   Collection Time: 12/07/15  5:00 PM  Result Value  Ref Range   Magnesium 2.2 1.7 - 2.4 mg/dL  Phosphorus     Status: None   Collection Time: 12/07/15  5:00 PM  Result Value Ref Range   Phosphorus 3.2 2.5 - 4.6 mg/dL  Glucose, capillary     Status: Abnormal   Collection Time: 12/07/15  7:35 PM  Result Value Ref Range   Glucose-Capillary 105 (H) 65 - 99 mg/dL   Comment 1 Notify RN    Comment 2 Document in Chart   Draw ABG 1 hour after initiation of ventilator     Status: Abnormal   Collection Time: 12/07/15 10:30 PM  Result Value Ref Range   FIO2 60.00    Delivery systems VENTILATOR    Mode PRESSURE REGULATED VOLUME CONTROL    VT 660 mL   LHR 14 resp/min   Peep/cpap 5.0 cm H20   pH, Arterial 7.344 (L) 7.350 - 7.450   pCO2 arterial 31.4 (L) 32.0 - 48.0 mmHg   pO2, Arterial 132 (H) 83.0 - 108.0 mmHg   Bicarbonate 16.7 (L) 20.0 - 28.0 mmol/L   Acid-base deficit 7.6 (H) 0.0 - 2.0 mmol/L   O2 Saturation 98.3 %   Patient temperature 98.2    Collection site LEFT RADIAL    Drawn by [id number]    Sample type ARTERIAL DRAW    Allens test (pass/fail) PASS PASS  Glucose, capillary     Status: None   Collection Time: 12/07/15 11:28 PM  Result Value Ref Range   Glucose-Capillary 97 65 - 99 mg/dL   Comment 1 Notify RN    Comment 2 Document in Chart   Glucose, capillary     Status: None   Collection Time: 12/08/15  3:44 AM  Result Value Ref Range   Glucose-Capillary 91 65 - 99 mg/dL   Comment 1 Notify RN    Comment 2 Document in Chart   Heparin level (unfractionated)     Status: None   Collection Time: 12/08/15  5:00 AM  Result Value Ref Range   Heparin Unfractionated 0.56 0.30 - 0.70 IU/mL    Comment:        IF HEPARIN RESULTS ARE BELOW EXPECTED VALUES, AND PATIENT DOSAGE HAS BEEN CONFIRMED, SUGGEST FOLLOW UP TESTING OF ANTITHROMBIN III LEVELS.   CBC     Status: Abnormal   Collection Time: 12/08/15  5:00 AM  Result Value Ref Range   WBC 18.1 (H) 4.0 - 10.5 K/uL   RBC 3.36 (L) 4.22 - 5.81 MIL/uL   Hemoglobin 10.3 (L) 13.0 - 17.0  g/dL   HCT 30.3 (L) 39.0 - 52.0 %   MCV 90.2 78.0 - 100.0 fL   MCH 30.7 26.0 - 34.0 pg   MCHC 34.0 30.0 - 36.0 g/dL   RDW 14.6 11.5 - 15.5 %   Platelets 223 150 - 400 K/uL  Basic metabolic  panel     Status: Abnormal   Collection Time: 12/08/15  5:00 AM  Result Value Ref Range   Sodium 141 135 - 145 mmol/L   Potassium 3.6 3.5 - 5.1 mmol/L   Chloride 115 (H) 101 - 111 mmol/L   CO2 20 (L) 22 - 32 mmol/L   Glucose, Bld 104 (H) 65 - 99 mg/dL   BUN 45 (H) 6 - 20 mg/dL   Creatinine, Ser 1.22 0.61 - 1.24 mg/dL   Calcium 7.2 (L) 8.9 - 10.3 mg/dL   GFR calc non Af Amer 56 (L) >60 mL/min   GFR calc Af Amer >60 >60 mL/min    Comment: (NOTE) The eGFR has been calculated using the CKD EPI equation. This calculation has not been validated in all clinical situations. eGFR's persistently <60 mL/min signify possible Chronic Kidney Disease.    Anion gap 6 5 - 15  Magnesium     Status: None   Collection Time: 12/08/15  5:00 AM  Result Value Ref Range   Magnesium 2.2 1.7 - 2.4 mg/dL  Phosphorus     Status: None   Collection Time: 12/08/15  5:00 AM  Result Value Ref Range   Phosphorus 3.5 2.5 - 4.6 mg/dL  Glucose, capillary     Status: None   Collection Time: 12/08/15  7:45 AM  Result Value Ref Range   Glucose-Capillary 91 65 - 99 mg/dL  Glucose, capillary     Status: Abnormal   Collection Time: 12/08/15 11:11 AM  Result Value Ref Range   Glucose-Capillary 102 (H) 65 - 99 mg/dL  Glucose, capillary     Status: Abnormal   Collection Time: 12/08/15  3:39 PM  Result Value Ref Range   Glucose-Capillary 109 (H) 65 - 99 mg/dL  Magnesium     Status: None   Collection Time: 12/08/15  6:20 PM  Result Value Ref Range   Magnesium 2.2 1.7 - 2.4 mg/dL  Phosphorus     Status: None   Collection Time: 12/08/15  6:20 PM  Result Value Ref Range   Phosphorus 3.3 2.5 - 4.6 mg/dL  Glucose, capillary     Status: Abnormal   Collection Time: 12/08/15  8:28 PM  Result Value Ref Range   Glucose-Capillary  114 (H) 65 - 99 mg/dL   Comment 1 Notify RN    Comment 2 Document in Chart   Glucose, capillary     Status: Abnormal   Collection Time: 12/08/15 11:34 PM  Result Value Ref Range   Glucose-Capillary 118 (H) 65 - 99 mg/dL  Glucose, capillary     Status: Abnormal   Collection Time: 12/09/15  3:29 AM  Result Value Ref Range   Glucose-Capillary 124 (H) 65 - 99 mg/dL   Comment 1 Notify RN    Comment 2 Document in Chart   Heparin level (unfractionated)     Status: None   Collection Time: 12/09/15  4:09 AM  Result Value Ref Range   Heparin Unfractionated 0.44 0.30 - 0.70 IU/mL    Comment:        IF HEPARIN RESULTS ARE BELOW EXPECTED VALUES, AND PATIENT DOSAGE HAS BEEN CONFIRMED, SUGGEST FOLLOW UP TESTING OF ANTITHROMBIN III LEVELS.   Basic metabolic panel     Status: Abnormal   Collection Time: 12/09/15  4:09 AM  Result Value Ref Range   Sodium 144 135 - 145 mmol/L   Potassium 3.4 (L) 3.5 - 5.1 mmol/L   Chloride 117 (H) 101 - 111 mmol/L  CO2 21 (L) 22 - 32 mmol/L   Glucose, Bld 132 (H) 65 - 99 mg/dL   BUN 45 (H) 6 - 20 mg/dL   Creatinine, Ser 1.64 (H) 0.61 - 1.24 mg/dL   Calcium 7.3 (L) 8.9 - 10.3 mg/dL   GFR calc non Af Amer 39 (L) >60 mL/min   GFR calc Af Amer 45 (L) >60 mL/min    Comment: (NOTE) The eGFR has been calculated using the CKD EPI equation. This calculation has not been validated in all clinical situations. eGFR's persistently <60 mL/min signify possible Chronic Kidney Disease.    Anion gap 6 5 - 15  CBC with Differential/Platelet     Status: Abnormal   Collection Time: 12/09/15  4:09 AM  Result Value Ref Range   WBC 18.2 (H) 4.0 - 10.5 K/uL   RBC 2.86 (L) 4.22 - 5.81 MIL/uL   Hemoglobin 8.6 (L) 13.0 - 17.0 g/dL   HCT 25.6 (L) 39.0 - 52.0 %   MCV 89.5 78.0 - 100.0 fL   MCH 30.1 26.0 - 34.0 pg   MCHC 33.6 30.0 - 36.0 g/dL   RDW 14.6 11.5 - 15.5 %   Platelets 283 150 - 400 K/uL   Neutrophils Relative % 89 %   Lymphocytes Relative 7 %   Monocytes Relative  4 %   Eosinophils Relative 0 %   Basophils Relative 0 %   Neutro Abs 16.2 (H) 1.7 - 7.7 K/uL   Lymphs Abs 1.3 0.7 - 4.0 K/uL   Monocytes Absolute 0.7 0.1 - 1.0 K/uL   Eosinophils Absolute 0.0 0.0 - 0.7 K/uL   Basophils Absolute 0.0 0.0 - 0.1 K/uL   WBC Morphology TOXIC GRANULATION     Comment: MILD LEFT SHIFT (1-5% METAS, OCC MYELO, OCC BANDS)    Imaging / Studies: Dg Chest Port 1 View  Result Date: 12/09/2015 CLINICAL DATA:  Respiratory failure. EXAM: PORTABLE CHEST 1 VIEW COMPARISON:  12/08/2015. FINDINGS: Endotracheal tube and left subclavian line stable position. Heart size stable. Persistent atelectatic changes left lung base. Associated left lower lobe infiltrate cannot be excluded. No prominent pleural effusion or pneumothorax. IMPRESSION: 1. Lines and tubes in stable position. 2. Persistent atelectatic changes left lung base. Mild left base infiltrate cannot be excluded. No change from prior exam . Electronically Signed   By: Marcello Moores  Register   On: 12/09/2015 07:04   Dg Chest Port 1 View  Result Date: 12/08/2015 CLINICAL DATA:  Respiratory failure. EXAM: PORTABLE CHEST 1 VIEW COMPARISON:  12/07/2015. FINDINGS: Endotracheal to in stable position. Left subclavian line in stable position. Progressive atelectatic changes left lower lobe. Associated infiltrate cannot be excluded. No pleural effusion or pneumothorax. IMPRESSION: 1. Lines and tubes stable position. 2. Progressive atelectatic changes left lower lobe. Associated left lower lobe infiltrate cannot be excluded . Electronically Signed   By: Marcello Moores  Register   On: 12/08/2015 07:27   Portable Chest Xray  Result Date: 12/07/2015 CLINICAL DATA:  Decreased oxygen saturation EXAM: PORTABLE CHEST 1 VIEW COMPARISON:  12/07/2015 FINDINGS: AP portable semi-erect view of the chest. Endotracheal tube tip is approximately 4.9 cm superior to the carina. Hazy opacity at the left lung base, likely atelectasis. This finding is unchanged.  Possible tiny left effusion. No new consolidation. Cardiomediastinal silhouette stable. No pneumothorax. Left-sided central venous catheter tip overlies the SVC. IMPRESSION: 1. Support lines and tubes as above. 2. No significant interval change in hazy atelectasis or infiltrate at the left base. Electronically Signed   By: Maudie Mercury  Francoise Ceo M.D.   On: 12/07/2015 22:55   Dg Abd Portable 1v  Result Date: 12/07/2015 CLINICAL DATA:  Evaluate percutaneous jejunostomy tube. EXAM: PORTABLE ABDOMEN - 1 VIEW COMPARISON:  12/02/2015 FINDINGS: There is oral contrast in the jejunum in the left upper quadrant. I do not see any definite leaking intraperitoneal contrast. There is some aching contrast laterally which appears to be in the abdominal wall. IMPRESSION: Leakage of contrast near the percutaneous site, likely in the abdominal wall. No definite intraperitoneal leakage of contrast. Electronically Signed   By: Marijo Sanes M.D.   On: 12/07/2015 11:59    Medications / Allergies: per chart  Antibiotics: Anti-infectives    Start     Dose/Rate Route Frequency Ordered Stop   12/05/15 1200  anidulafungin (ERAXIS) 100 mg in sodium chloride 0.9 % 100 mL IVPB     100 mg over 90 Minutes Intravenous Every 24 hours 12/04/15 1045     12/04/15 1200  anidulafungin (ERAXIS) 200 mg in sodium chloride 0.9 % 200 mL IVPB     200 mg over 180 Minutes Intravenous  Once 12/04/15 1045 12/04/15 1725   12/04/15 0600  cefoTEtan (CEFOTAN) 2 g in dextrose 5 % 50 mL IVPB     2 g 100 mL/hr over 30 Minutes Intravenous On call to O.R. 12/03/15 1821 12/03/15 2007   12/04/15 0400  piperacillin-tazobactam (ZOSYN) IVPB 3.375 g     3.375 g 12.5 mL/hr over 240 Minutes Intravenous Every 8 hours 12/03/15 2229     12/02/15 0000  erythromycin ethylsuccinate (EES) 200 MG/5ML suspension 400 mg  Status:  Discontinued    Comments:  Administer via J tube (16Fr LLQ)   400 mg Oral Every 6 hours 12/01/15 2020 12/03/15 0748   11/27/15 1200   erythromycin 500 mg in sodium chloride 0.9 % 100 mL IVPB     500 mg 100 mL/hr over 60 Minutes Intravenous Every 6 hours 11/27/15 0940 11/29/15 0608   11/26/15 0830  erythromycin 250 mg in sodium chloride 0.9 % 100 mL IVPB  Status:  Discontinued     250 mg 100 mL/hr over 60 Minutes Intravenous Every 6 hours 11/26/15 0751 11/27/15 0940        Note: Portions of this report may have been transcribed using voice recognition software. Every effort was made to ensure accuracy; however, inadvertent computerized transcription errors may be present.   Any transcriptional errors that result from this process are unintentional.    [name], M.D., F.A.C.S. Gastrointestinal and Minimally Invasive Surgery [facility] [address], [place], [place] [address] [phone] Main / Paging     12/09/2015

## 2015-12-09 NOTE — Progress Notes (Signed)
ANTICOAGULATION CONSULT NOTE - Follow-up  Pharmacy Consult for heparin IV Indication: RUE thrombus  Allergies  Allergen Reactions  . Flomax [Tamsulosin Hcl] Other (See Comments)    Leg weakness  . Lisinopril Cough  . Lorazepam     "i don't like it"  Prefers Xanax or valium  . Uroxatral [Alfuzosin Hcl Er] Other (See Comments)    Leg weakness    Patient Measurements: Height: 6\' 2"  (188 cm) Weight: 204 lb 9.4 oz (92.8 kg) IBW/kg (Calculated) : 82.2 Heparin Dosing Weight: 95 kg (TBW at time of initiation of heparin infusion)  Vital Signs: Temp: 98 F (36.7 C) (10/25 0400) Temp Source: Axillary (10/25 0400) BP: 135/33 (10/25 0600) Pulse Rate: 87 (10/25 0329)  Labs:  Recent Labs  12/07/15 0430 12/08/15 0500 12/09/15 0409  HGB 10.2* 10.3* 8.6*  HCT 29.1* 30.3* 25.6*  PLT 184 223 283  HEPARINUNFRC 0.55 0.56 0.44  CREATININE 1.15 1.22 1.64*    Estimated Creatinine Clearance: 44.6 mL/min (by C-G formula based on SCr of 1.64 mg/dL (H)).  Medications, infusions: . sodium chloride 10 mL/hr at 12/09/15 0500  . dexmedetomidine Stopped (12/09/15 0401)  . heparin 1,300 Units/hr (12/09/15 0500)  . norepinephrine (LEVOPHED) Adult infusion Stopped (12/07/15 1400)    Assessment: 74 yoM with placement of gastrostomy and feeding jejunostomy tubes, s/p recent laparoscopy with washout and omentopexy repair on 10/19, now with distal brachial thrombus likely related to A-line placed there; fingers purple with unpalpable pulse. Per surgery, ok to start heparin for thrombus.  Significant events: 10/20: brachial A-line removed 10/21: having to collect heparin level from central line due to inability to get peripheral (including foot stick) collection.  Holding heparin briefly and flushing prior to obtaining sample.  Today, 12/09/2015:  Heparin level 0.44, remains therapeutic on current rate of 1300 units/hr  CBC: Hgb decreased to 8.6, Pltc WNL  No infusion issues noted per nursing.  Per RN, still having a small amount of blood from around one of the abdominal tube sites, but not worse than yesterday.  Goal of Therapy: Heparin level 0.3-0.7 units/ml, prefer 0.3-0.5 due to recent surgery Monitor platelets by anticoagulation protocol: Yes  Plan:  Continue heparin infusion at 1300 units/hr.  Daily heparin level-have asked RN to try peripheral stick again tomorrow AM.   Daily CBC, closely watch Hgb for further significant decrease.  Monitor closely for signs of bleeding or thrombosis.   Greer PickerelJigna Leaf [facility], PharmD, BCPS Pager: [phone] 12/09/2015 9:24 AM

## 2015-12-09 NOTE — Progress Notes (Signed)
New drainage seen around G-tube site upon assessment.  Drainage coming out around the site itself, and not through the tube connected to intermittent suction. Drainage is different in color and consistency than what was previously seen, and from what is in the tubing connected to suction, it is brown and thick. NP assessed.  MD made aware.  Orders in place from MD and NP.   Will continue to monitor.

## 2015-12-09 NOTE — Progress Notes (Addendum)
NUTRITION NOTE  New consult received at 1030 for new TPN. Full follow-up note written by this RD at [phone]. Will follow-up tomorrow. PICC/central line not in place at this time. Will also monitor for updated pharmacy note later today for plan for TPN.  Estimated Nutritional Needs:  Kcal:  1998 Protein:  111-139 grams (1.2-1.5 grams/kg) Fluid:  per Surgery/MD/NP    Trenton GammonJessica Tyshea Imel, MS, RD, LDN Inpatient Clinical Dietitian Pager # [phone] After hours/weekend pager # [phone]

## 2015-12-09 NOTE — Progress Notes (Signed)
eLink Physician-Brief Progress Note Patient Name: Danny GalRalph T Dipiero DOB: February 08, 1941 MRN: [id number]   Date of Service  12/09/2015  HPI/Events of Note  Rn noted blood tinged urine, drop in hemoglobin. Pt on heparin.   eICU Interventions  Will check H/H now and am. If dropping may need to stop heparin.      Intervention Category Major Interventions: Hemorrhage - evaluation and management  Shane Crutchradeep Daisie Haft 12/09/2015, 8:20 PM

## 2015-12-10 ENCOUNTER — Inpatient Hospital Stay (HOSPITAL_COMMUNITY): Payer: Medicare Other

## 2015-12-10 ENCOUNTER — Encounter (HOSPITAL_COMMUNITY): Payer: Self-pay | Admitting: Radiology

## 2015-12-10 LAB — CBC
HEMATOCRIT: 22.6 % — AB (ref 39.0–52.0)
HEMATOCRIT: 24.2 % — AB (ref 39.0–52.0)
HEMOGLOBIN: 8.3 g/dL — AB (ref 13.0–17.0)
Hemoglobin: 7.7 g/dL — ABNORMAL LOW (ref 13.0–17.0)
MCH: 30.2 pg (ref 26.0–34.0)
MCH: 30.3 pg (ref 26.0–34.0)
MCHC: 34.1 g/dL (ref 30.0–36.0)
MCHC: 34.3 g/dL (ref 30.0–36.0)
MCV: 88.6 fL (ref 78.0–100.0)
MCV: 88.3 fL (ref 78.0–100.0)
Platelets: 446 10*3/uL — ABNORMAL HIGH (ref 150–400)
Platelets: 508 10*3/uL — ABNORMAL HIGH (ref 150–400)
RBC: 2.55 MIL/uL — ABNORMAL LOW (ref 4.22–5.81)
RBC: 2.74 MIL/uL — AB (ref 4.22–5.81)
RDW: 14.6 % (ref 11.5–15.5)
RDW: 14.6 % (ref 11.5–15.5)
WBC: 14.4 10*3/uL — ABNORMAL HIGH (ref 4.0–10.5)
WBC: 18.5 10*3/uL — AB (ref 4.0–10.5)

## 2015-12-10 LAB — CBC WITH DIFFERENTIAL/PLATELET
BASOS PCT: 0 %
Basophils Absolute: 0 10*3/uL (ref 0.0–0.1)
EOS PCT: 1 %
Eosinophils Absolute: 0.2 10*3/uL (ref 0.0–0.7)
HEMATOCRIT: 23.2 % — AB (ref 39.0–52.0)
Hemoglobin: 8 g/dL — ABNORMAL LOW (ref 13.0–17.0)
Lymphocytes Relative: 11 %
Lymphs Abs: 1.7 10*3/uL (ref 0.7–4.0)
MCH: 30.7 pg (ref 26.0–34.0)
MCHC: 34.5 g/dL (ref 30.0–36.0)
MCV: 88.9 fL (ref 78.0–100.0)
MONO ABS: 0.9 10*3/uL (ref 0.1–1.0)
MONOS PCT: 6 %
NEUTROS PCT: 82 %
Neutro Abs: 12.2 10*3/uL — ABNORMAL HIGH (ref 1.7–7.7)
PLATELETS: 447 10*3/uL — AB (ref 150–400)
RBC: 2.61 MIL/uL — ABNORMAL LOW (ref 4.22–5.81)
RDW: 14.6 % (ref 11.5–15.5)
WBC: 15 10*3/uL — ABNORMAL HIGH (ref 4.0–10.5)

## 2015-12-10 LAB — BASIC METABOLIC PANEL
ANION GAP: 7 (ref 5–15)
BUN: 49 mg/dL — ABNORMAL HIGH (ref 6–20)
CHLORIDE: 114 mmol/L — AB (ref 101–111)
CO2: 22 mmol/L (ref 22–32)
Calcium: 7.6 mg/dL — ABNORMAL LOW (ref 8.9–10.3)
Creatinine, Ser: 1.46 mg/dL — ABNORMAL HIGH (ref 0.61–1.24)
GFR calc non Af Amer: 45 mL/min — ABNORMAL LOW (ref >60)
GFR, EST AFRICAN AMERICAN: 52 mL/min — AB (ref >60)
Glucose, Bld: 141 mg/dL — ABNORMAL HIGH (ref 65–99)
Potassium: 3 mmol/L — ABNORMAL LOW (ref 3.5–5.1)
Sodium: 143 mmol/L (ref 135–145)

## 2015-12-10 LAB — COMPREHENSIVE METABOLIC PANEL
ALK PHOS: 117 U/L (ref 38–126)
ALT: 20 U/L (ref 17–63)
AST: 25 U/L (ref 15–41)
Albumin: 1.5 g/dL — ABNORMAL LOW (ref 3.5–5.0)
Anion gap: 6 (ref 5–15)
BUN: 50 mg/dL — ABNORMAL HIGH (ref 6–20)
CALCIUM: 7.4 mg/dL — AB (ref 8.9–10.3)
CO2: 22 mmol/L (ref 22–32)
CREATININE: 1.6 mg/dL — AB (ref 0.61–1.24)
Chloride: 118 mmol/L — ABNORMAL HIGH (ref 101–111)
GFR, EST AFRICAN AMERICAN: 47 mL/min — AB (ref >60)
GFR, EST NON AFRICAN AMERICAN: 40 mL/min — AB (ref >60)
Glucose, Bld: 180 mg/dL — ABNORMAL HIGH (ref 65–99)
Potassium: 3.1 mmol/L — ABNORMAL LOW (ref 3.5–5.1)
Sodium: 146 mmol/L — ABNORMAL HIGH (ref 135–145)
Total Bilirubin: 0.6 mg/dL (ref 0.3–1.2)
Total Protein: 4.5 g/dL — ABNORMAL LOW (ref 6.5–8.1)

## 2015-12-10 LAB — TRIGLYCERIDES: Triglycerides: 214 mg/dL — ABNORMAL HIGH (ref <150)

## 2015-12-10 LAB — GLUCOSE, CAPILLARY
GLUCOSE-CAPILLARY: 129 mg/dL — AB (ref 65–99)
GLUCOSE-CAPILLARY: 141 mg/dL — AB (ref 65–99)
GLUCOSE-CAPILLARY: 144 mg/dL — AB (ref 65–99)
GLUCOSE-CAPILLARY: 174 mg/dL — AB (ref 65–99)
Glucose-Capillary: 106 mg/dL — ABNORMAL HIGH (ref 65–99)
Glucose-Capillary: 128 mg/dL — ABNORMAL HIGH (ref 65–99)
Glucose-Capillary: 171 mg/dL — ABNORMAL HIGH (ref 65–99)

## 2015-12-10 LAB — PREPARE RBC (CROSSMATCH)

## 2015-12-10 LAB — MAGNESIUM
MAGNESIUM: 2.2 mg/dL (ref 1.7–2.4)
MAGNESIUM: 2 mg/dL (ref 1.7–2.4)

## 2015-12-10 LAB — LACTIC ACID, PLASMA: LACTIC ACID, VENOUS: 1.3 mmol/L (ref 0.5–1.9)

## 2015-12-10 LAB — PHOSPHORUS: Phosphorus: 3.4 mg/dL (ref 2.5–4.6)

## 2015-12-10 LAB — PREALBUMIN: PREALBUMIN: 6.4 mg/dL — AB (ref 18–38)

## 2015-12-10 LAB — HEPARIN LEVEL (UNFRACTIONATED): Heparin Unfractionated: 0.4 IU/mL (ref 0.30–0.70)

## 2015-12-10 MED ORDER — HEPARIN SODIUM (PORCINE) 5000 UNIT/ML IJ SOLN: 5000.0000 [IU] | Freq: Three times a day (TID) | INTRAMUSCULAR | Status: DC

## 2015-12-10 MED ORDER — FENTANYL CITRATE (PF) 100 MCG/2ML IJ SOLN
200.0000 ug | Freq: Once | INTRAMUSCULAR | Status: AC
  Administered 2015-12-11: 100 ug via INTRAVENOUS
  Filled 2015-12-10: qty 4

## 2015-12-10 MED ORDER — FUROSEMIDE 10 MG/ML IJ SOLN
60.0000 mg | Freq: Once | INTRAMUSCULAR | Status: AC
  Administered 2015-12-10: 60 mg via INTRAVENOUS
  Filled 2015-12-10: qty 6

## 2015-12-10 MED ORDER — FENTANYL CITRATE (PF) 100 MCG/2ML IJ SOLN
25.0000 ug | INTRAMUSCULAR | Status: DC | PRN
  Administered 2015-12-10 – 2015-12-11 (×9): 50 ug via INTRAVENOUS
  Filled 2015-12-10 (×9): qty 2

## 2015-12-10 MED ORDER — POTASSIUM CHLORIDE 10 MEQ/100ML IV SOLN
10.0000 meq | INTRAVENOUS | Status: AC
  Administered 2015-12-10 – 2015-12-11 (×4): 10 meq via INTRAVENOUS
  Filled 2015-12-10 (×4): qty 100

## 2015-12-10 MED ORDER — ALBUMIN HUMAN 25 % IV SOLN
50.0000 g | Freq: Once | INTRAVENOUS | Status: AC
  Administered 2015-12-10: 50 g via INTRAVENOUS
  Filled 2015-12-10: qty 200

## 2015-12-10 MED ORDER — SODIUM CHLORIDE 0.9% FLUSH
10.0000 mL | Freq: Two times a day (BID) | INTRAVENOUS | Status: DC
  Administered 2015-12-10: 40 mL
  Administered 2015-12-11 – 2015-12-14 (×4): 10 mL
  Administered 2015-12-14: 40 mL
  Administered 2015-12-15 – 2015-12-23 (×14): 10 mL

## 2015-12-10 MED ORDER — TRACE MINERALS CR-CU-MN-SE-ZN 10-1000-500-60 MCG/ML IV SOLN
INTRAVENOUS | Status: AC
  Administered 2015-12-10: 19:00:00 via INTRAVENOUS
  Filled 2015-12-10: qty 960

## 2015-12-10 MED ORDER — POTASSIUM CHLORIDE 20 MEQ/15ML (10%) PO SOLN: 40.0000 meq | Freq: Once | ORAL | Status: DC

## 2015-12-10 MED ORDER — HALOPERIDOL LACTATE 5 MG/ML IJ SOLN
1.0000 mg | Freq: Four times a day (QID) | INTRAMUSCULAR | Status: DC | PRN
  Administered 2015-12-10: 2 mg via INTRAVENOUS
  Administered 2015-12-13: 1 mg via INTRAVENOUS
  Administered 2015-12-14 – 2015-12-19 (×4): 2 mg via INTRAVENOUS
  Administered 2015-12-23: 1 mg via INTRAVENOUS
  Filled 2015-12-10 (×7): qty 1

## 2015-12-10 MED ORDER — POTASSIUM CHLORIDE 10 MEQ/100ML IV SOLN
10.0000 meq | INTRAVENOUS | Status: AC
  Administered 2015-12-10 (×4): 10 meq via INTRAVENOUS
  Filled 2015-12-10 (×4): qty 100

## 2015-12-10 MED ORDER — MORPHINE SULFATE (PF) 2 MG/ML IV SOLN
2.0000 mg | INTRAVENOUS | Status: DC | PRN
  Administered 2015-12-10 – 2015-12-11 (×4): 4 mg via INTRAVENOUS
  Filled 2015-12-10 (×4): qty 2

## 2015-12-10 MED ORDER — MIDAZOLAM HCL 2 MG/2ML IJ SOLN
1.0000 mg | INTRAMUSCULAR | Status: DC | PRN
  Administered 2015-12-10 – 2015-12-11 (×3): 1 mg via INTRAVENOUS
  Administered 2015-12-11: 2 mg via INTRAVENOUS
  Administered 2015-12-11: 1 mg via INTRAVENOUS
  Filled 2015-12-10 (×6): qty 2

## 2015-12-10 MED ORDER — LACTATED RINGERS IV BOLUS (SEPSIS): 1000.0000 mL | Freq: Three times a day (TID) | INTRAVENOUS | Status: AC | PRN

## 2015-12-10 MED ORDER — FAT EMULSION 20 % IV EMUL
240.0000 mL | INTRAVENOUS | Status: AC
  Administered 2015-12-10: 240 mL via INTRAVENOUS
  Filled 2015-12-10: qty 250

## 2015-12-10 MED ORDER — MIDAZOLAM HCL 2 MG/2ML IJ SOLN
1.0000 mg | Freq: Once | INTRAMUSCULAR | Status: AC
  Administered 2015-12-10: 1 mg via INTRAVENOUS

## 2015-12-10 MED ORDER — HALOPERIDOL LACTATE 5 MG/ML IJ SOLN
2.0000 mg | Freq: Four times a day (QID) | INTRAMUSCULAR | Status: DC | PRN
  Filled 2015-12-10: qty 1

## 2015-12-10 MED ORDER — SODIUM CHLORIDE 0.9% FLUSH
10.0000 mL | INTRAVENOUS | Status: DC | PRN
  Administered 2015-12-23 – 2015-12-24 (×2): 10 mL
  Administered 2015-12-24 – 2015-12-28 (×5): 20 mL
  Filled 2015-12-10 (×7): qty 40

## 2015-12-10 MED ORDER — VECURONIUM BROMIDE 10 MG IV SOLR: 10.0000 mg | Freq: Once | INTRAVENOUS | Status: DC

## 2015-12-10 MED ORDER — ENOXAPARIN SODIUM 40 MG/0.4ML ~~LOC~~ SOLN: 40.0000 mg | SUBCUTANEOUS | Status: DC

## 2015-12-10 MED ORDER — ETOMIDATE 2 MG/ML IV SOLN
40.0000 mg | Freq: Once | INTRAVENOUS | Status: DC
  Administered 2015-12-11: 20 mg via INTRAVENOUS

## 2015-12-10 MED ORDER — IOPAMIDOL (ISOVUE-300) INJECTION 61%
100.0000 mL | Freq: Once | INTRAVENOUS | Status: AC | PRN
  Administered 2015-12-10: 80 mL via INTRAVENOUS

## 2015-12-10 MED ORDER — HALOPERIDOL LACTATE 5 MG/ML IJ SOLN
1.0000 mg | Freq: Two times a day (BID) | INTRAMUSCULAR | Status: DC
  Administered 2015-12-10 – 2015-12-22 (×24): 1 mg via INTRAVENOUS
  Filled 2015-12-10 (×25): qty 1

## 2015-12-10 MED ORDER — PROPOFOL 500 MG/50ML IV EMUL
5.0000 ug/kg/min | Freq: Once | INTRAVENOUS | Status: AC
  Administered 2015-12-11: 30 ug/kg/min via INTRAVENOUS
  Administered 2015-12-11: 70 ug/kg/min via INTRAVENOUS
  Filled 2015-12-10: qty 50

## 2015-12-10 NOTE — Progress Notes (Signed)
Pt. Had 28 beat run of SVT at 1619. E link to be notified. Will continue to monitor.

## 2015-12-10 NOTE — Progress Notes (Signed)
eLink Physician-Brief Progress Note Patient Name: Danny GalRalph T [name] DOB: [birth year] MRN: [id number]   Date of Service  12/10/2015  HPI/Events of Note  Labs ok except K 3.0  eICU Interventions  40 meq per NG     Intervention Category Major Interventions: Electrolyte abnormality - evaluation and management  Sandrea HughsMichael Wert 12/10/2015, 8:04 PM

## 2015-12-10 NOTE — Progress Notes (Signed)
[facility] NOTE   Pharmacy Consult for TPN  Patient Measurements: Height: 6' 2"  (188 cm) Weight: 201 lb 11.5 oz (91.5 kg) IBW/kg (Calculated) : 82.2 TPN AdjBW (KG): 97.2 Body mass index is 25.9 kg/m.  Insulin Requirements: 5 units Novolog in past 24 hours  Current Nutrition: NPO  IVF: NS at 10 ml/hr  Central access: PICC placed 10/12, pt pulled out 10/19. CVC in place since 10/19 TPN date: 10/12-10/19, resumed 10/25  ASSESSMENT                                                                                                          HPI: 37 yoM admitted 10/9 with new upper abdominal discomfort and bloating with concern for recurrent GOO due to chronic pyloric stricture. 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  Pharmacy consulted to start TPN for gastric ileus and obstruction 10/12. Patient underwent G and J tube placement this admission. On 10/19, patient pulled out PICC line. He also had to go back to OR after he removed his G tube accidentally, leading to peritonitis, intubation, and septic shock. Tube feed rate slowly advanced by CCS, but today patient noted to have dark brown output around J tube site, and tube feeds held. Pharmacy asked to resume TPN.   Today, 12/10/15:   Glucose - No hx DM noted. CBGs elevated since initiation of TPN.  Electrolytes - K+ slightly low at 3.1, Cl- high at 118, all other lytes, including Corrected Ca WNL  Renal - AKI, I/O: 1536.08/2408  LFTs - AST/ALT, Alk Phos, Tbili WNL  TGs -76 (10/13), 97 (10/16), 214 (10/26)  Prealbumin - 17.8 (10/11), 12.8 (10/13), 11 (10/16), 12.5 (10/18), < 5 (10/23)  NUTRITIONAL GOALS                                                                                             RD recs (as of 10/25): TKPT:4656 Protein:111-139 grams (1.2-1.5 grams/kg)  Clinimix 5/15 at a goal rate of 16m/hr +108g/day protein,  2013Kcal/day.  **There is currently a nPsychologist, prison and probation servicesof Clinimix solution. No 2L bags of Clinimix E 5/20 currently available. To conserve supply, will consider goal rate of Clinimix 5/15 at 83 ml/hr + 20% fat emulsion at 170mhr, which will keep total volume at 2L rather than increasing use to 3L.   PLAN  Now:  IV KCL 55mq x4 runs   At 1800 today:  Continue Clinimix E 5/15 at 40 ml/hr.   20% fat emulsion at 10 ml/hr.  TPN to contain standard multivitamins and trace elements.  Plan to advance as tolerated to the goal rate.   IVF per MD.  Continue sensitive SSI q4h.   TPN lab panels in AM and on Mondays & Thursdays.  F/u daily.  MNetta Cedars PharmD, BCPS Pager: [phone]/26/2017@9 :00 AM

## 2015-12-10 NOTE — Progress Notes (Signed)
Peripherally Inserted Central Catheter/Midline Placement  The IV Nurse has discussed with the patient and/or persons authorized to consent for the patient, the purpose of this procedure and the potential benefits and risks involved with this procedure.  The benefits include less needle sticks, lab draws from the catheter, and the patient may be discharged home with the catheter. Risks include, but not limited to, infection, bleeding, blood clot (thrombus formation), and puncture of an artery; nerve damage and irregular heartbeat and possibility to perform a PICC exchange if needed/ordered by physician.  Alternatives to this procedure were also discussed.  Bard Power PICC patient education guide, fact sheet on infection prevention and patient information card has been provided to patient /or left at bedside.    PICC/Midline Placement Documentation    Consent in chart from 11/26/2015    Reginia FortsLumban, Najah Liverman Albarece 12/10/2015, 6:14 PM

## 2015-12-10 NOTE — Progress Notes (Addendum)
[name]  [address] [place], [place] [address] Phone: [phone] FAX: [phone]   [name] [id number] Jun 27, 1940  CARE TEAM:  PCP: Irven Shelling, MD  Outpatient Care Team: Patient Care Team: Lavone Orn, MD as PCP - General (Internal Medicine) Michael Boston, MD as Consulting Physician (General Surgery) Arta Silence, MD as Consulting Physician (Gastroenterology)  Inpatient Treatment Team: Treatment Team: Attending Provider: Michael Boston, MD; Consulting Physician: Arta Silence, MD; Technician: Sueanne Margarita, NT; Consulting Physician: Md Pccm, MD; Rounding Team: Md Pccm, MD; Consulting Physician: Serafina Mitchell, MD; Registered Nurse: Sheron Nightingale, RN; Registered Nurse: Meredith Mody, RN; Registered Nurse: Lynnell Dike, RN; Registered Nurse: Benny Lennert, RN  SURGERY 12/03/2015  POST-OPERATIVE DIAGNOSIS:    Peritonitis Jejunal disruption Gastric leak  PROCEDURE:    LAPAROSCOPY DIAGNOSTIC OMENTOPEXY of jejunal disruption [place] OUT x 21L with drains placement  SURGEON:  Michael Boston, MD   Post-Op 12/01/2015  POST-OPERATIVE DIAGNOSIS:    RECURRENT GASTRIC OUTLET OBSTRUCTION DUE to significant edema at gastroduodenal anastomosis. GASTRITIS Failure to thrive with malnutrition  PROCEDURE:   LAPAROSCOPIC PLACEMENT OF FEEDING JEJUNOSTOMY AND GASTROSTOMY TUBEs ESOPHAGOGASTRODUODENOSCOPY (EGD) BALLOON DILATION OF DUODENAL STRICTURE Gastric biopsies  Closure of gastrotomy  SURGEON:  Michael Boston, MD  Prior surgery 09/25/2015  POST-OPERATIVE DIAGNOSIS:  Partial gastric outlet obstruction due to chronic pyloric stricture  PROCEDURE:   XI ROBOTIC distal gastrectomy BILROTH I  ANASTOMOSIS  ANTERIOR AND POSTERIOR VAGOTOMY DOR (Anterior 623 degree) FUNDIPLICATION OMENTOPEXY UPPER ENDOSCOPY  SURGEON:  Michael Boston, MD   Problem List:   Principal Problem:   Bile  peritonitis due to gastric tube dislodgement Active Problems:   Essential hypertension, benign   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 09/25/2015   Hyperlipidemia   Anxiety state   Gastric outlet obstruction   Gastric Ileus    Encephalopathy acute   Endotracheally intubated   On mechanically assisted ventilation (HCC)   Protein-calorie malnutrition, severe   Acute respiratory failure with hypoxia (HCC)   Tobacco abuse   Ischemic right index finger at tip   Gastritis   Gastric tube present (LUQ)   Jejunostomy tube present (LLQ)    Assessment/Plan:  GUARDED s/p patching at jejunum & resecuring Gtube & massive washout  -CT scan of abdomen and pelvis to rule out any abscess or other collections.  If positive, percutaneously drain.  If negative, plan to remove middle Blake drain that sits on the right side.  Retry tube feeds at trophic only x24.  Make sure there is no distal obstruction that is putting pressure on the jejunum and stomach and keeping it from draining better.  I think because of his agitation and sedation issues, he is not going to fly with another extubation event.  I am leaning more towards proceeding with tracheostomy as discussed with critical care.  Discussed with Dr. Lake Bells.  Discussed with ICU nurses and team.  Discussed with the patient's wife and daughter at the bedside.  I think the tracheostomy allow him to do trach collar trials and more gradually come off the ventilator.  Hopefully less need for sedation.  Sedation making him hypotensive which means he needs more blood pressure medicines.  We will tentatively try tomorrow at the bedside percutaneously as long as can come off anticoagulation and no need for drainage of intra-abdominal processes.  -diuresis as tolerated to help with secretions/vent.    -IV nutrition TNA   -Gtube to LIWS  for ileus & inflammatory narrowing at gastroduodenal Bilroth I anastomosis.   the fact that there is  bilious reflux into the stomach argues against a complete obstruction.Skin care.  PPI, H2B for gastriris.  F/u Bx's for Hpylori neg.  Add carafate via Gtube as tolerated  Diagnosis Stomach, biopsy - CHRONIC FOCALLY ACTIVE GASTRITIS. - THERE IS NO EVIDENCE OF HELICOBACTER PYLORI, DYSPLASIA OR MALIGNANCY.  -try standing tylenol for pain.    Gradually use Jtube liquid meds if tolerates TFs  -Zosyn/antifungal IV x 7 days minimum.  shock less, & drains much more serous are hopeful signs.  CT scan negative, may be able to come off of IV antibiotics.    -brachial artery embolus to right index finger.   D/w Dr. Trula Slade with vascular surgery.   Duplex study argues against any more brachial clot with good blood supply.  Can come off heparin   -Sedation/Anxiolysis - challenge with pt that goes from sleeping to pulling at lines/tubes.  mits for now.   Consider transitioning from Xanax and benzos and utilizing haldol/zyprexa/seroquel  Sundowning precautions.  Back on Precidex.  SUNDOWNING PRECAUTIONS   VTE prophylaxis - SCDs  Nicotene patch - apparently dips (pt denied to to me in past but  wife and daughter do confirm that he actually uses it moderately.  Patient's wife hopeful that a lot of this is nicotine withdrawal.  While I am skeptical, does not hurt to use that.  Certainly that can make gastritis/ulcer problems worse.    Disposition: He will definitetly require skilled care facility with rehabilitation capacity upon discharge from the hospital.  I suspect it's going to take at least 2 weeks if not several before he'll be ready for that transition.  His daughter expressed understanding and appreciation   I discussed operative findings, updated the patient's status, discussed probable steps to recovery, and gave postoperative recommendations to the patient and family.   discussion with critical-care nursing staff as well as ICU director Dr. Lake Bells.  Recommendations were made.  Questions were  answered.  They expressed understanding & appreciation.    [name], M.D., F.A.C.S. Gastrointestinal and Minimally Invasive Surgery [facility] [address] [place], [place] [address] [phone] Main / Paging    12/10/2015   Subjective:  Agitated intermittently.   pulled at endotracheal tube without 2 cm.  Reposition.Back on Precidex. Patient intermittently alert but gets confused and once to pull it Foley catheter this morning.   Increased drainage around gastrostomy tube.  Place nasogastric tube this morning for better suction Min vent settings Off pressors since pressure drops with the sedatives  No more leaking around G tube this morning with nasogastric tube in place  No leaking around [facility] ICU nurses in room No family in room  first thing this morning.  However family available later this morning.   Objective:  Vital signs:  Vitals:   12/10/15 0440 12/10/15 0447 12/10/15 0500 12/10/15 0515  BP: (!) 106/36 (!) 176/45 (!) 196/25 (!) 208/59  Pulse:      Resp: 16 16 16 16   Temp:      TempSrc:      SpO2: 100% 100% 100% 100%  Weight:      Height:        Last BM Date: 12/08/15  Intake/Output   Yesterday:  10/25 0701 - 10/26 0700 In: 1882.9 [I.V.:1411; NG/GT:121.8; IV Piggyback:330] Out: 6808 [Urine:500; Drains:745] This shift:  Total I/O In: 1043.6 [I.V.:973.6; Other:20; IV Piggyback:50] Out:  42 [Urine:500; Drains:150]  Bowel function:  Flatus: YES  BM:  YES  Drain: Gtube thick dark old blood  Blake drains all serous - no bile tinge    Physical Exam:  General: Pt awake/alert.  Answering Y/N ?s.  Following commands but occ confused.   No acute distress at best.   Eyes: PERRL, normal EOM.  Sclera clear.  No icterus Neuro: CN II-XII intact w/o focal sensory/motor deficits. Lymph: No head/neck/groin lymphadenopathy Psych:  No delerium/psychosis/paranoia HENT: Normocephalic, Mucus membranes moist.  No  thrush. ETT in place Neck: Supple, No tracheal deviation Chest: CTAB. Chest wall pain w good excursion CV:  Pulses intact.  Regular rhythm MS: Normal AROM mjr joints.  No obvious deformity Abdomen:  G tube LUQ to LIWS old blood.  Rash around skin but dry, some fullness around the region of the gastrostomy tube but no fluctuance - dressings clean around it.  J tube in LLQ - skin clean  Softer.  Mildy distended.  Tenderness at G tube site.  No diffuse peritonitis. GU:  Foley in place.  NEMG Ext:  SCDs BLE.  No mjr edema.  Right index finger unchanged - distal pad & nail bed with cyanosis.  Good cap refill to nail bed  Skin: 1+ anasarca.  No other petechiae / purpura  Results:   Diagnosis Stomach, biopsy - CHRONIC FOCALLY ACTIVE GASTRITIS. - THERE IS NO EVIDENCE OF HELICOBACTER PYLORI, DYSPLASIA OR MALIGNANCY. - SEE COMMENT. Microscopic Comment A Warthin-Starry stain is negative for the presence of Helicobacter pylori organisms. (JBK:gt, 12/04/15) Enid Cutter MD Pathologist, Electronic Signature (Case signed 12/04/2015) Specimen Leeyah [name] and Clinical Information Specimen(s) Obtained: Stomach, biopsy Specimen Clinical  Labs: Results for orders placed or performed during the hospital encounter of 11/24/15 (from the past 48 hour(s))  Glucose, capillary     Status: None   Collection Time: 12/08/15  7:45 AM  Result Value Ref Range   Glucose-Capillary 91 65 - 99 mg/dL  Glucose, capillary     Status: Abnormal   Collection Time: 12/08/15 11:11 AM  Result Value Ref Range   Glucose-Capillary 102 (H) 65 - 99 mg/dL  Glucose, capillary     Status: Abnormal   Collection Time: 12/08/15  3:39 PM  Result Value Ref Range   Glucose-Capillary 109 (H) 65 - 99 mg/dL  Magnesium     Status: None   Collection Time: 12/08/15  6:20 PM  Result Value Ref Range   Magnesium 2.2 1.7 - 2.4 mg/dL  Phosphorus     Status: None   Collection Time: 12/08/15  6:20 PM  Result Value Ref Range   Phosphorus 3.3 2.5 -  4.6 mg/dL  Glucose, capillary     Status: Abnormal   Collection Time: 12/08/15  8:28 PM  Result Value Ref Range   Glucose-Capillary 114 (H) 65 - 99 mg/dL   Comment 1 Notify RN    Comment 2 Document in Chart   Glucose, capillary     Status: Abnormal   Collection Time: 12/08/15 11:34 PM  Result Value Ref Range   Glucose-Capillary 118 (H) 65 - 99 mg/dL  Glucose, capillary     Status: Abnormal   Collection Time: 12/09/15  3:29 AM  Result Value Ref Range   Glucose-Capillary 124 (H) 65 - 99 mg/dL   Comment 1 Notify RN    Comment 2 Document in Chart   Heparin level (unfractionated)     Status: None   Collection Time: 12/09/15  4:09 AM  Result Value Ref Range  Heparin Unfractionated 0.44 0.30 - 0.70 IU/mL    Comment:        IF HEPARIN RESULTS ARE BELOW EXPECTED VALUES, AND PATIENT DOSAGE HAS BEEN CONFIRMED, SUGGEST FOLLOW UP TESTING OF ANTITHROMBIN III LEVELS.   Basic metabolic panel     Status: Abnormal   Collection Time: 12/09/15  4:09 AM  Result Value Ref Range   Sodium 144 135 - 145 mmol/L   Potassium 3.4 (L) 3.5 - 5.1 mmol/L   Chloride 117 (H) 101 - 111 mmol/L   CO2 21 (L) 22 - 32 mmol/L   Glucose, Bld 132 (H) 65 - 99 mg/dL   BUN 45 (H) 6 - 20 mg/dL   Creatinine, Ser 1.64 (H) 0.61 - 1.24 mg/dL   Calcium 7.3 (L) 8.9 - 10.3 mg/dL   GFR calc non Af Amer 39 (L) >60 mL/min   GFR calc Af Amer 45 (L) >60 mL/min    Comment: (NOTE) The eGFR has been calculated using the CKD EPI equation. This calculation has not been validated in all clinical situations. eGFR's persistently <60 mL/min signify possible Chronic Kidney Disease.    Anion gap 6 5 - 15  CBC with Differential/Platelet     Status: Abnormal   Collection Time: 12/09/15  4:09 AM  Result Value Ref Range   WBC 18.2 (H) 4.0 - 10.5 K/uL   RBC 2.86 (L) 4.22 - 5.81 MIL/uL   Hemoglobin 8.6 (L) 13.0 - 17.0 g/dL   HCT 25.6 (L) 39.0 - 52.0 %   MCV 89.5 78.0 - 100.0 fL   MCH 30.1 26.0 - 34.0 pg   MCHC 33.6 30.0 - 36.0 g/dL    RDW 14.6 11.5 - 15.5 %   Platelets 283 150 - 400 K/uL   Neutrophils Relative % 89 %   Lymphocytes Relative 7 %   Monocytes Relative 4 %   Eosinophils Relative 0 %   Basophils Relative 0 %   Neutro Abs 16.2 (H) 1.7 - 7.7 K/uL   Lymphs Abs 1.3 0.7 - 4.0 K/uL   Monocytes Absolute 0.7 0.1 - 1.0 K/uL   Eosinophils Absolute 0.0 0.0 - 0.7 K/uL   Basophils Absolute 0.0 0.0 - 0.1 K/uL   WBC Morphology TOXIC GRANULATION     Comment: MILD LEFT SHIFT (1-5% METAS, OCC MYELO, OCC BANDS)  Magnesium     Status: None   Collection Time: 12/09/15  4:09 AM  Result Value Ref Range   Magnesium 2.2 1.7 - 2.4 mg/dL  Phosphorus     Status: None   Collection Time: 12/09/15  4:09 AM  Result Value Ref Range   Phosphorus 4.0 2.5 - 4.6 mg/dL  Hepatic function panel     Status: Abnormal   Collection Time: 12/09/15  4:09 AM  Result Value Ref Range   Total Protein 4.3 (L) 6.5 - 8.1 g/dL   Albumin 1.4 (L) 3.5 - 5.0 g/dL   AST 24 15 - 41 U/L   ALT 21 17 - 63 U/L   Alkaline Phosphatase 120 38 - 126 U/L   Total Bilirubin 1.1 0.3 - 1.2 mg/dL   Bilirubin, Direct 0.5 0.1 - 0.5 mg/dL   Indirect Bilirubin 0.6 0.3 - 0.9 mg/dL  Glucose, capillary     Status: Abnormal   Collection Time: 12/09/15  7:52 AM  Result Value Ref Range   Glucose-Capillary 125 (H) 65 - 99 mg/dL  Glucose, capillary     Status: Abnormal   Collection Time: 12/09/15 11:50 AM  Result Value Ref  Range   Glucose-Capillary 126 (H) 65 - 99 mg/dL  Glucose, capillary     Status: Abnormal   Collection Time: 12/09/15  3:56 PM  Result Value Ref Range   Glucose-Capillary 117 (H) 65 - 99 mg/dL  Glucose, capillary     Status: None   Collection Time: 12/09/15  7:47 PM  Result Value Ref Range   Glucose-Capillary 91 65 - 99 mg/dL   Comment 1 Notify RN    Comment 2 Document in Chart   Hemoglobin and hematocrit, blood     Status: Abnormal   Collection Time: 12/09/15  8:48 PM  Result Value Ref Range   Hemoglobin 8.0 (L) 13.0 - 17.0 g/dL   HCT 23.2 (L)  39.0 - 52.0 %  Glucose, capillary     Status: Abnormal   Collection Time: 12/10/15 12:38 AM  Result Value Ref Range   Glucose-Capillary 171 (H) 65 - 99 mg/dL  Glucose, capillary     Status: Abnormal   Collection Time: 12/10/15  3:53 AM  Result Value Ref Range   Glucose-Capillary 174 (H) 65 - 99 mg/dL  Heparin level (unfractionated)     Status: None   Collection Time: 12/10/15  4:18 AM  Result Value Ref Range   Heparin Unfractionated 0.40 0.30 - 0.70 IU/mL    Comment:        IF HEPARIN RESULTS ARE BELOW EXPECTED VALUES, AND PATIENT DOSAGE HAS BEEN CONFIRMED, SUGGEST FOLLOW UP TESTING OF ANTITHROMBIN III LEVELS.   Comprehensive metabolic panel     Status: Abnormal   Collection Time: 12/10/15  4:18 AM  Result Value Ref Range   Sodium 146 (H) 135 - 145 mmol/L   Potassium 3.1 (L) 3.5 - 5.1 mmol/L   Chloride 118 (H) 101 - 111 mmol/L   CO2 22 22 - 32 mmol/L   Glucose, Bld 180 (H) 65 - 99 mg/dL   BUN 50 (H) 6 - 20 mg/dL   Creatinine, Ser 1.60 (H) 0.61 - 1.24 mg/dL   Calcium 7.4 (L) 8.9 - 10.3 mg/dL   Total Protein 4.5 (L) 6.5 - 8.1 g/dL   Albumin 1.5 (L) 3.5 - 5.0 g/dL   AST 25 15 - 41 U/L   ALT 20 17 - 63 U/L   Alkaline Phosphatase 117 38 - 126 U/L   Total Bilirubin 0.6 0.3 - 1.2 mg/dL   GFR calc non Af Amer 40 (L) >60 mL/min   GFR calc Af Amer 47 (L) >60 mL/min    Comment: (NOTE) The eGFR has been calculated using the CKD EPI equation. This calculation has not been validated in all clinical situations. eGFR's persistently <60 mL/min signify possible Chronic Kidney Disease.    Anion gap 6 5 - 15  Magnesium     Status: None   Collection Time: 12/10/15  4:18 AM  Result Value Ref Range   Magnesium 2.2 1.7 - 2.4 mg/dL  Phosphorus     Status: None   Collection Time: 12/10/15  4:18 AM  Result Value Ref Range   Phosphorus 3.4 2.5 - 4.6 mg/dL  CBC with Differential/Platelet     Status: Abnormal   Collection Time: 12/10/15  4:18 AM  Result Value Ref Range   WBC 15.0 (H) 4.0 -  10.5 K/uL   RBC 2.61 (L) 4.22 - 5.81 MIL/uL   Hemoglobin 8.0 (L) 13.0 - 17.0 g/dL   HCT 23.2 (L) 39.0 - 52.0 %   MCV 88.9 78.0 - 100.0 fL   MCH 30.7 26.0 -  34.0 pg   MCHC 34.5 30.0 - 36.0 g/dL   RDW 14.6 11.5 - 15.5 %   Platelets 447 (H) 150 - 400 K/uL   Neutrophils Relative % 82 %   Lymphocytes Relative 11 %   Monocytes Relative 6 %   Eosinophils Relative 1 %   Basophils Relative 0 %   Neutro Abs 12.2 (H) 1.7 - 7.7 K/uL   Lymphs Abs 1.7 0.7 - 4.0 K/uL   Monocytes Absolute 0.9 0.1 - 1.0 K/uL   Eosinophils Absolute 0.2 0.0 - 0.7 K/uL   Basophils Absolute 0.0 0.0 - 0.1 K/uL   WBC Morphology TOXIC GRANULATION     Imaging / Studies: Dg Chest Port 1 View  Result Date: 12/10/2015 CLINICAL DATA:  Readjusted ETT tube EXAM: PORTABLE CHEST 1 VIEW COMPARISON:  12/09/2015 FINDINGS: Endotracheal tube tip is approximately 4.7 cm superior to the carina. Left-sided central venous catheter tip overlies the SVC. Left CP angle is not included. Hazy left base atelectasis or infiltrate unchanged. Stable cardiomediastinal silhouette. No pneumothorax. IMPRESSION: 1. Support lines and tubes as above 2. Stable hazy left basilar atelectasis or infiltrate Electronically Signed   By: Donavan Foil M.D.   On: 12/10/2015 00:36   Dg Chest Port 1 View  Result Date: 12/09/2015 CLINICAL DATA:  Respiratory failure. EXAM: PORTABLE CHEST 1 VIEW COMPARISON:  12/08/2015. FINDINGS: Endotracheal tube and left subclavian line stable position. Heart size stable. Persistent atelectatic changes left lung base. Associated left lower lobe infiltrate cannot be excluded. No prominent pleural effusion or pneumothorax. IMPRESSION: 1. Lines and tubes in stable position. 2. Persistent atelectatic changes left lung base. Mild left base infiltrate cannot be excluded. No change from prior exam . Electronically Signed   By: Marcello Moores  Register   On: 12/09/2015 07:04    Medications / Allergies: per chart  Antibiotics: Anti-infectives     Start     Dose/Rate Route Frequency Ordered Stop   12/09/15 1200  anidulafungin (ERAXIS) 100 mg in sodium chloride 0.9 % 100 mL IVPB     100 mg over 90 Minutes Intravenous Every 24 hours 12/09/15 0918 12/11/15 1159   12/05/15 1200  anidulafungin (ERAXIS) 100 mg in sodium chloride 0.9 % 100 mL IVPB  Status:  Discontinued     100 mg over 90 Minutes Intravenous Every 24 hours 12/04/15 1045 12/09/15 0835   12/04/15 1200  anidulafungin (ERAXIS) 200 mg in sodium chloride 0.9 % 200 mL IVPB     200 mg over 180 Minutes Intravenous  Once 12/04/15 1045 12/04/15 1725   12/04/15 0600  cefoTEtan (CEFOTAN) 2 g in dextrose 5 % 50 mL IVPB     2 g 100 mL/hr over 30 Minutes Intravenous On call to O.R. 12/03/15 1821 12/03/15 2007   12/04/15 0400  piperacillin-tazobactam (ZOSYN) IVPB 3.375 g     3.375 g 12.5 mL/hr over 240 Minutes Intravenous Every 8 hours 12/03/15 2229     12/02/15 0000  erythromycin ethylsuccinate (EES) 200 MG/5ML suspension 400 mg  Status:  Discontinued    Comments:  Administer via J tube (16Fr LLQ)   400 mg Oral Every 6 hours 12/01/15 2020 12/03/15 0748   11/27/15 1200  erythromycin 500 mg in sodium chloride 0.9 % 100 mL IVPB     500 mg 100 mL/hr over 60 Minutes Intravenous Every 6 hours 11/27/15 0940 11/29/15 0608   11/26/15 0830  erythromycin 250 mg in sodium chloride 0.9 % 100 mL IVPB  Status:  Discontinued     250  mg 100 mL/hr over 60 Minutes Intravenous Every 6 hours 11/26/15 0751 11/27/15 0940        Note: Portions of this report may have been transcribed using voice recognition software. Every effort was made to ensure accuracy; however, inadvertent computerized transcription errors may be present.   Any transcriptional errors that result from this process are unintentional.    [name], M.D., F.A.C.S. Gastrointestinal and Minimally Invasive Surgery [facility] [address], [place], [place] [address] [phone] Main /  Paging     12/10/2015

## 2015-12-10 NOTE — Progress Notes (Signed)
Repeat u/s of right arm shows no evidence of thrombus OK to discontinue IV heparin Please call with additional questions   Wyonia HoughWells Braham

## 2015-12-10 NOTE — Progress Notes (Signed)
2315: Pt very agitated and pulled out ETT 2-3 cm. Notified ELink MD, received orders to advance tube and re-secure with repeat CXR. MD also suggested increasing Precedex and Levo.  2330: gave 1 mg versed, pt did not respond  2350: Increased Precedex from 0.3 mg/hr to 1.2 mg/hr over the next hour to decrease agitation. Pt did not respond to it and continued to be agitated.  0000: gave 4 mg of morphine, pt now calm.   Will continue to monitor.

## 2015-12-10 NOTE — Progress Notes (Signed)
Called MD and notified of bloody stool from rectum approx. 250cc. Taking pt. Down to CT. Will do hgb when we return to floor. NP with critical care was available and saw pt.

## 2015-12-10 NOTE — Progress Notes (Signed)
eLink Physician-Brief Progress Note Patient Name: [name] DOB: 24-Jun-1940 MRN: [id number]   Date of Service  12/10/2015  HPI/Events of Note  Request for one time dose of Versed to place PICC line.   eICU Interventions  Will order: 1. Versed 1 mg IV X 1 now.      Intervention Category Minor Interventions: Agitation / anxiety - evaluation and management  Sommer,Steven Eugene 12/10/2015, 6:01 PM

## 2015-12-10 NOTE — Progress Notes (Signed)
Afternoon rounds:  Events: -Remains ventilator dependent-->we have plans for bedside trach at 0830 tomorrow.  -Heparin off since this am (as recommended by vascular).  -Still on pressors.  -Getting albumin and lasix to a/w mobilizing third spaced fluid/anasarca -Developed rectal bleeding. This has initiated after starting bowel prep. Hgb has drifted from 8 to 7.7 --> will stop [place] heparin, have ordered type and screen w/ repeat CBC at 1800. Will have 2 units held and ready for transfusion -CT abd pending-  Plan Cont full vent support Anticipate trach in AM assuming all things stabilize F/u CBC at 1800-->anticipate he will need blood Surgical services to f/u on CT scan   Simonne MartinetPeter E Yurianna Tusing ACNP-BC [facility] Pager # [phone] OR # [phone] if no answer

## 2015-12-10 NOTE — Progress Notes (Signed)
ANTICOAGULATION CONSULT NOTE - Follow-up  Pharmacy Consult for heparin IV Indication: RUE thrombus  Allergies  Allergen Reactions  . Flomax [Tamsulosin Hcl] Other (See Comments)    Leg weakness  . Lisinopril Cough  . Lorazepam     "i don't like it"  Prefers Xanax or valium  . Uroxatral [Alfuzosin Hcl Er] Other (See Comments)    Leg weakness    Patient Measurements: Height: 6\' 2"  (188 cm) Weight: 201 lb 11.5 oz (91.5 kg) IBW/kg (Calculated) : 82.2   Vital Signs: Temp: 96.4 F (35.8 C) (10/26 0800) Temp Source: Axillary (10/26 0800) BP: 122/30 (10/26 0700) Pulse Rate: 66 (10/26 0425)  Labs:  Recent Labs  12/08/15 0500 12/09/15 0409 12/09/15 2048 12/10/15 0418  HGB 10.3* 8.6* 8.0* 8.0*  HCT 30.3* 25.6* 23.2* 23.2*  PLT 223 283  --  447*  HEPARINUNFRC 0.56 0.44  --  0.40  CREATININE 1.22 1.64*  --  1.60*    Estimated Creatinine Clearance: 45.7 mL/min (by C-G formula based on SCr of 1.6 mg/dL (H)).  Medications, infusions: . sodium chloride 10 mL/hr at 12/10/15 0400  . dexmedetomidine 0.3 mcg/kg/hr (12/10/15 0834)  . [name].TPN (CLINIMIX-E) Adult 40 mL/hr at 12/10/15 0400   And  . fat emulsion 240 mL (12/10/15 0400)  . heparin 1,300 Units/hr (12/10/15 0100)  . norepinephrine (LEVOPHED) Adult infusion 16 mcg/min (12/10/15 0519)    Assessment: 74 yoM with placement of gastrostomy and feeding jejunostomy tubes, s/p recent laparoscopy with washout and omentopexy repair on 10/19, now with distal brachial thrombus likely related to A-line placed there; fingers purple with unpalpable pulse. Per surgery, ok to start heparin for thrombus.  Significant events: 10/20: brachial A-line removed 10/21: having to collect heparin level from central line due to inability to get peripheral (including foot stick) collection.  Holding heparin briefly and flushing prior to obtaining sample. 10/25: RUE ultrasound negative for thrombus  Today, 12/10/2015:  Heparin level 0.4, remains  therapeutic on current rate of 1300 units/hr  CBC: Hgb decreased to 8.0, Pltc WNL  No infusion issues noted per nursing. Still with blood draining in G-tube, but not worse than yesterday.  Goal of Therapy: Heparin level 0.3-0.7 units/ml, prefer 0.3-0.5 due to recent surgery Monitor platelets by anticoagulation protocol: Yes  Plan:  Continue heparin infusion at 1300 units/hr.  Daily heparin level & CBC while on heparin   Monitor closely for signs of bleeding or thrombosis.  F/U planned duration of anticoagulation  Junita PushMichelle Cylinda Santoli, PharmD, BCPS Pager: [phone] 12/10/2015@9 :10 AM

## 2015-12-10 NOTE — Progress Notes (Addendum)
[name]  [birth year] [id number]  Patient Care Team: Lavone Orn, MD as PCP - General (Internal Medicine) Michael Boston, MD as Consulting Physician (General Surgery) Arta Silence, MD as Consulting Physician (Gastroenterology)  Patient with hematochezia 2 after enteral contrast given through jejunostomy tube.  I doubt he is actively bleeding.  Most likely oozing from being fully anticoagulated as well as findings below  CT scan Discussed with radiology, Dr. Earle Gell.  It shows concern pneumatosis & probable partial small bowel obstruction and mid jejunum.  No SMA thrombosis.  No evidence of any embolic disease in the mesentery.Jejunostomy tube in place.  No perforation.  No extravasation of contrast around jejunostomy tube.  Right side clear.  Pelvis clear. May consider trying very low-dose elemental tube feeds as there is some data that is protective in pneumatosis intestinalis.  Check lactate level.  Weaning off pressors.  Extremely hostile abdomen.  I would not operate on this patient unless he goes under septic shock with severe distention and peritonitis.  Extremely hostile abdomen with high risk of enterocutaneous fistula and breakdown of any small bowel resection.  Any bowel obstruction would be related to inflammation and adhesions and an early inflammatory stage.  Would manage conservatively anyway.  Continue proximal decompression with orogastric and gastric tube active LIWS suction and jejunostomy tube back to gravity  2 collections in the abdomen. Loculated ascites versus an abscess. 1st collection lateral to the greater curvature of stomach.  2nd collection lateral to spleen.  We'll ask interventional radiology to percutaneously drain to make sure there is no abscess or other concern.  Would remove Surgical blake drains going down to the pelvis and right side (right flank and right lower quadrant drains).  Would like to leave the RUQ Blake drain that is near the gastrostomy and  jejunostomy tubes for now.  Continue antibiotics.  Patient Active Problem List   Diagnosis Date Noted  . Tobacco abuse 12/09/2015  . Ischemic right index finger at tip 12/09/2015  . Gastritis 12/09/2015  . Gastric tube present (LUQ) 12/09/2015  . Jejunostomy tube present (LLQ) 12/09/2015  . Acute respiratory failure with hypoxia ([place])   . Protein-calorie malnutrition, severe 12/04/2015  . Encephalopathy acute 12/03/2015  . Bile peritonitis due to gastric tube dislodgement 12/03/2015  . Endotracheally intubated   . On mechanically assisted ventilation ([place])   . Gastric Ileus  11/25/2015  . Gastric outlet obstruction 11/24/2015  . Anxiety state 09/28/2015  . Hyperlipidemia   . 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 Coronary atherosclerosis of native coronary artery 04/24/2013  . Essential hypertension, benign 04/24/2013  . Other and unspecified hyperlipidemia 04/24/2013  . Central pterygium 10/30/2012  . 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 Cataract, nuclear sclerotic senile 10/30/2012    Past Medical History:  Diagnosis Date  . Bilateral dry eyes   . Coronary artery disease 1997   bare mental stent right coronart artery  . Degenerative disc disease    LOWER BACK  . Dysphonia   . ED (erectile dysfunction)   . Gastroesophageal reflux disease   . Hyperlipidemia   . Hypertension   . Peptic ulcer   . Pneumonia    HISTORY OF IN CHILDHOOD  . Posterior vitreous detachment 10/30/2012  . Urinary hesitancy   . Wears glasses     Past Surgical History:  Procedure Laterality Date  . CARDIAC CATHETERIZATION    . CHOLECYSTECTOMY    . ESOPHAGOGASTRODUODENOSCOPY N/A  12/01/2015   Procedure: ESOPHAGOGASTRODUODENOSCOPY (EGD) BALLOON DILATION OF DUODENAL STRICTURE;  Surgeon: Michael Boston, MD;  Location: [facility];  Service: General;  Laterality: N/A;  . ESOPHAGOGASTRODUODENOSCOPY (EGD) WITH PROPOFOL N/A  07/01/2015   Procedure: ESOPHAGOGASTRODUODENOSCOPY (EGD) WITH PROPOFOL;  Surgeon: Arta Silence, MD;  Location: [facility];  Service: Endoscopy;  Laterality: N/A;  . ESOPHAGOGASTRODUODENOSCOPY (EGD) WITH PROPOFOL N/A 11/23/2015   Procedure: ESOPHAGOGASTRODUODENOSCOPY (EGD) WITH PROPOFOL;  Surgeon: Arta Silence, MD;  Location: [facility] ENDOSCOPY;  Service: Endoscopy;  Laterality: N/A;  may need to intubate  . EYE SURGERY     growth on right eye removed   . LAPAROSCOPIC GASTROSTOMY N/A 12/01/2015   Procedure: LAPAROSCOPIC PLACEMENT OF FEEDING JEJUNOSTOMY AND GASTROSTOMY TUBE;  Surgeon: Michael Boston, MD;  Location: [facility];  Service: General;  Laterality: N/A;  . LAPAROSCOPY N/A 12/03/2015   Procedure: LAPAROSCOPY DIAGNOSTIC, OMENTOPEXY, JEJUNOSTOMY, [place] OUT;  Surgeon: Michael Boston, MD;  Location: [facility];  Service: General;  Laterality: N/A;  . STENT TO HEART     2 1997 AND 1 IN 1996  . TONSILLECTOMY    . XI ROBOTIC VAGOTOMY AND ANTRECTOMY N/A 09/25/2015   Procedure: XI ROBOTIC ANTERIOR AND POSTERIOR VAGOTOMY, BILROTH I  ANASTOMOSIS DOR FUNDIPLICATION, OMENTOPEXY  UPPER ENDOSCOPY;  Surgeon: Michael Boston, MD;  Location: [facility];  Service: General;  Laterality: N/A;    Social History   Social History  . Marital status: Married    Spouse name: N/A  . Number of children: N/A  . Years of education: N/A   Occupational History  . Not on file.   Social History Main Topics  . Smoking status: Former Smoker    Packs/day: 2.00    Years: 20.00    Types: Cigarettes    Quit date: 02/15/1995  . Smokeless tobacco: Never Used  . Alcohol use No  . Drug use: No  . Sexual activity: Not on file   Other Topics Concern  . Not on file   Social History Narrative  . No narrative on file    Family History  Problem Relation Age of Onset  . Heart attack Mother     Current Facility-Administered Medications  Medication Dose Route Frequency Provider Last Rate Last Dose  . 0.9 %  sodium chloride infusion    Intravenous Continuous Michael Boston, MD 10 mL/hr at 12/10/15 1600    . bisacodyl (DULCOLAX) suppository 10 mg  10 mg Rectal Daily Michael Boston, MD   10 mg at 12/09/15 1017  . chlorhexidine gluconate (MEDLINE KIT) (PERIDEX) 0.12 % solution 15 mL  15 mL Mouth Rinse BID Michael Boston, MD   15 mL at 12/10/15 0836  . cycloSPORINE (RESTASIS) 0.05 % ophthalmic emulsion 1 drop  1 drop Both Eyes BID Michael Boston, MD   1 drop at 12/10/15 0934  . dexmedetomidine (PRECEDEX) 200 MCG/50ML (4 mcg/mL) infusion  0-1.2 mcg/kg/hr Intravenous Continuous Laverle Hobby, MD   Stopped at 12/10/15 1015  . etomidate (AMIDATE) injection 40 mg  40 mg Intravenous Once Erick Colace, NP      . famotidine (PEPCID) IVPB 20 mg premix  20 mg Intravenous Q24H Michael Boston, MD   20 mg at 12/10/15 0940  . TPN (CLINIMIX-E) Adult   Intravenous Continuous TPN Thomes Lolling, [facility]       And  . fat emulsion 20 % infusion 240 mL  240 mL Intravenous Continuous TPN Thomes Lolling, RPH      . fentaNYL (SUBLIMAZE) injection 200 mcg  200 mcg  Intravenous Once Erick Colace, NP      . fentaNYL (SUBLIMAZE) injection 25-50 mcg  25-50 mcg Intravenous Q1H PRN Anders Simmonds, MD      . haloperidol lactate (HALDOL) injection 1 mg  1 mg Intravenous Q12H Juanito Doom, MD   1 mg at 12/10/15 1155  . haloperidol lactate (HALDOL) injection 1-2 mg  1-2 mg Intravenous Q6H PRN Juanito Doom, MD      . insulin aspart (novoLOG) injection 0-9 Units  0-9 Units Subcutaneous Q4H Juanito Doom, MD   1 Units at 12/10/15 1157  . lactated ringers bolus 1,000 mL  1,000 mL Intravenous Q8H PRN Michael Boston, MD      . MEDLINE mouth rinse  15 mL Mouth Rinse QID Michael Boston, MD   15 mL at 12/10/15 1552  . midazolam (VERSED) injection 1 mg  1 mg Intravenous Q4H PRN Juanito Doom, MD   1 mg at 12/10/15 1540  . midazolam (VERSED) injection 1 mg  1 mg Intravenous Once Anders Simmonds, MD      . morphine 2 MG/ML injection 2-4 mg  2-4 mg  Intravenous Q2H PRN Michael Boston, MD      . nicotine (NICODERM CQ - dosed in mg/24 hours) patch 21 mg  21 mg Transdermal Daily Anders Simmonds, MD   21 mg at 12/10/15 0933  . nitroGLYCERIN (NITROSTAT) SL tablet 0.4 mg  0.4 mg Sublingual Q5 min PRN Michael Boston, MD      . norepinephrine (LEVOPHED) 16 mg in dextrose 5 % 250 mL (0.064 mg/mL) infusion  2-50 mcg/min Intravenous Continuous Michael Boston, MD 4.7 mL/hr at 12/10/15 1600 5.013 mcg/min at 12/10/15 1600  . ondansetron (ZOFRAN) injection 4 mg  4 mg Intravenous Q6H PRN Jerrye Beavers, PA-C   4 mg at 12/01/15 2001  . piperacillin-tazobactam (ZOSYN) IVPB 3.375 g  3.375 g Intravenous Q8H Minda Ditto, RPH   3.375 g at 12/10/15 1729  . polyvinyl alcohol (LIQUIFILM TEARS) 1.4 % ophthalmic solution 2 drop  2 drop Both Eyes BID Michael Boston, MD   2 drop at 12/10/15 0919  . prochlorperazine (COMPAZINE) injection 5-10 mg  5-10 mg Intravenous Q4H PRN Michael Boston, MD      . propofol (DIPRIVAN) 500 MG/50ML infusion  5-80 mcg/kg/min Intravenous Once Erick Colace, NP      . sodium chloride flush (NS) 0.9 % injection 10-40 mL  10-40 mL Intracatheter PRN Arta Silence, MD   10 mL at 12/01/15 0510  . sodium chloride flush (NS) 0.9 % injection 10-40 mL  10-40 mL Intracatheter Q12H Michael Boston, MD      . sodium chloride flush (NS) 0.9 % injection 10-40 mL  10-40 mL Intracatheter PRN Michael Boston, MD      . sucralfate (CARAFATE) 1 GM/10ML suspension 1 g  1 g Per Tube Q6H Michael Boston, MD   Stopped at 12/09/15 [id number]  . vecuronium (NORCURON) injection 10 mg  10 mg Intravenous Once Erick Colace, NP         Allergies  Allergen Reactions  . Flomax [Tamsulosin Hcl] Other (See Comments)    Leg weakness  . Lisinopril Cough  . Lorazepam     "i don't like it"  Prefers Xanax or valium  . Uroxatral [Alfuzosin Hcl Er] Other (See Comments)    Leg weakness    BP (!) 178/35   Pulse 66   Temp 98.2 F (36.8 C) (Axillary)   Resp 19  Ht _0  (1.88 m)   Wt  91.5 kg (201 lb 11.5 oz)   SpO2 100%   BMI 25.90 kg/m   Dg Abd 1 View  Result Date: 12/10/2015 CLINICAL DATA:  Check gastric catheter placement EXAM: ABDOMEN - 1 VIEW COMPARISON:  12/07/2015 FINDINGS: A gastric catheter is now seen in the distal esophagus. The tip does not appear to cross the gastroesophageal junction. A jejunostomy catheter is noted in the left mid abdomen. Surgical drain is noted on right. Scattered large and small bowel gas is noted. No free air is seen. IMPRESSION: Gastric catheter in the distal esophagus. This should be advanced into the stomach. Electronically Signed   By: Inez Catalina M.D.   On: 12/10/2015 08:03   Dg Abd 1 View  Result Date: 12/02/2015 CLINICAL DATA:  75 year old male with gastrostomy tube removal and placement of a Foley catheter through the gastrostomy. EXAM: ABDOMEN - 1 VIEW COMPARISON:  Abdominal radiograph dated 11/29/2015 and CT dated 11/25/2015 FINDINGS: A percutaneous catheter is noted in the left upper abdomen. 25 cc of Gastrografin was administered through the catheter which appears to opacified the stomach. The tip of the catheter and the balloon noted within the stomach. Small amount of contrast noted extending along the tube and over the patient's skin. There has been interval resolution of the previously seen gastric distention. There is no bowel dilatation or evidence of obstruction. No free air identified. Right upper quadrant cholecystectomy clips noted. The soft tissues and osseous structures are unremarkable. IMPRESSION: Percutaneously placed Foley catheter through the gastrostomy with tip and balloon appear in the stomach. Interval resolution of the previously seen air distended stomach. Electronically Signed   By: Anner Crete M.D.   On: 12/02/2015 22:49   Ct Abdomen Pelvis W Contrast  Result Date: 12/10/2015 CLINICAL DATA:  One week postop from laparoscopy for gastric and jejunal perforation. Previous distal gastrectomy with  Billroth 1 for ulcer disease. EXAM: CT ABDOMEN AND PELVIS WITH CONTRAST TECHNIQUE: Multidetector CT imaging of the abdomen and pelvis was performed using the standard protocol following bolus administration of intravenous contrast. CONTRAST:  66m ISOVUE-300 IOPAMIDOL (ISOVUE-300) INJECTION 61% COMPARISON:  11/25/2015 FINDINGS: Lower Chest: New small pleural effusions and basilar atelectasis, left side greater than right. Hepatobiliary: No mass identified. Prior cholecystectomy noted. No evidence of biliary dilatation. Pancreas:  No mass or inflammatory changes. Spleen: Within normal limits in size and appearance. Adrenals/Urinary Tract: No masses identified. No evidence of hydronephrosis. Foley catheter seen within the urinary bladder which is decompressed. Stomach/Bowel: Nasogastric tube and percutaneous gastrostomy tube are seen in the stomach. Percutaneous jejunostomy tube seen in place as well as several surgical drains. Moderate dilatation of proximal and mid small bowel loops is seen with transition point in the anterior right lower quadrant on image 59/2, consistent with a partial small bowel obstruction. Distal small bowel loops are nondilated. Pneumatosis is seen involving multiple dilated proximal and mid small bowel loops in the left abdomen and pelvis, suspicious for bowel ischemia. There is no evidence of portal venous gas or free intraperitoneal air. A left upper quadrant fluid collection is seen along the lateral aspect of proximal stomach in the gastrohepatic ligament which measures 6.2 x 9.0 cm. A left subdiaphragmatic/perisplenic fluid collection is also seen which measures 4.2 x 9.6 cm. These may represent postop fluid collections or abscesses. Small amount of free fluid noted in pelvic cul-de-sac. Vascular/Lymphatic: No pathologically enlarged lymph nodes. No abdominal aortic aneurysm. Aortic atherosclerosis. Reproductive:  No mass identified.  Other:  Diffuse mesenteric and body wall edema.  Musculoskeletal:  No suspicious bone lesions identified. IMPRESSION: Moderate dilatation of proximal and mid small bowel loops, with transition point in anterior right lower quadrant, suspicious for partial small bowel obstruction. Pneumatosis involving multiple dilated small bowel loops in the left abdomen pelvis, suspicious for bowel ischemia. No evidence of portal venous gas or free intraperitoneal air. Two left upper quadrant fluid collections adjacent to the stomach and spleen, which may represent postoperative fluid collections or abscesses. Small amount free fluid also noted in pelvis. Small bilateral pleural effusions and bibasilar atelectasis. Diffuse mesenteric and body wall edema. Electronically Signed   By: Earle Gell M.D.   On: 12/10/2015 15:30   Ct Abdomen Pelvis W Contrast  Result Date: 11/25/2015 CLINICAL DATA:  Partial gastrectomy 2 months ago. 50 pound weight loss. History of gastric outlet obstruction. Rule out leak or abscess. EXAM: CT ABDOMEN AND PELVIS WITH CONTRAST TECHNIQUE: Multidetector CT imaging of the abdomen and pelvis was performed using the standard protocol following bolus administration of intravenous contrast. CONTRAST:  111m ISOVUE-300 IOPAMIDOL (ISOVUE-300) INJECTION 61% COMPARISON:  None. FINDINGS: Lower chest: Patchy opacity posteriorly in the right lower lung, new since prior study. Cannot exclude area of early infiltrate. Left lung base is clear. Heart is normal size. Hepatobiliary: Prior cholecystectomy.  No focal hepatic abnormality. Pancreas: No focal abnormality or ductal dilatation. Spleen: No focal abnormality.  Normal size. Adrenals/Urinary Tract: No adrenal abnormality. No focal renal abnormality. No stones or hydronephrosis. Urinary bladder is unremarkable. Stomach/Bowel: There is distention of the stomach with layering fluid and gas. NG tube tip is in the proximal to mid stomach. Postsurgical changes in the region of the distal stomach. Small bowel is  decompressed. Scattered left colonic diverticula. No active diverticulitis. Vascular/Lymphatic: Diffuse aortoiliac atherosclerosis with calcifications. No aneurysm. No adenopathy. Reproductive: No visible focal abnormality. Other: No free fluid or free air. No focal fluid collection to suggest abscess. Musculoskeletal: No acute bony abnormality or focal bone lesion. IMPRESSION: Significant distention of the stomach with layering fluid and gas. NG tube is present in the proximal to mid stomach. Appearance concerning for Recurrent gastric outlet obstruction. Patchy new opacity noted posteriorly and right lower lobe. Cannot exclude early pneumonia. Prior cholecystectomy. Scattered left colonic diverticula.  No active diverticulitis. Aortoiliac atherosclerosis. These results will be called to the ordering clinician or representative by the Radiologist Assistant, and communication documented in the PACS or zVision Dashboard. Electronically Signed   By: KRolm BaptiseM.D.   On: 11/25/2015 16:05   Dg Chest Port 1 View  Result Date: 12/10/2015 CLINICAL DATA:  Readjusted ETT tube EXAM: PORTABLE CHEST 1 VIEW COMPARISON:  12/09/2015 FINDINGS: Endotracheal tube tip is approximately 4.7 cm superior to the carina. Left-sided central venous catheter tip overlies the SVC. Left CP angle is not included. Hazy left base atelectasis or infiltrate unchanged. Stable cardiomediastinal silhouette. No pneumothorax. IMPRESSION: 1. Support lines and tubes as above 2. Stable hazy left basilar atelectasis or infiltrate Electronically Signed   By: KDonavan FoilM.D.   On: 12/10/2015 00:36   Dg Chest Port 1 View  Result Date: 12/09/2015 CLINICAL DATA:  Respiratory failure. EXAM: PORTABLE CHEST 1 VIEW COMPARISON:  12/08/2015. FINDINGS: Endotracheal tube and left subclavian line stable position. Heart size stable. Persistent atelectatic changes left lung base. Associated left lower lobe infiltrate cannot be excluded. No prominent pleural  effusion or pneumothorax. IMPRESSION: 1. Lines and tubes in stable position. 2. Persistent atelectatic changes left lung base. Mild  left base infiltrate cannot be excluded. No change from prior exam . Electronically Signed   By: Marcello Moores  Register   On: 12/09/2015 07:04   Dg Chest Port 1 View  Result Date: 12/08/2015 CLINICAL DATA:  Respiratory failure. EXAM: PORTABLE CHEST 1 VIEW COMPARISON:  12/07/2015. FINDINGS: Endotracheal to in stable position. Left subclavian line in stable position. Progressive atelectatic changes left lower lobe. Associated infiltrate cannot be excluded. No pleural effusion or pneumothorax. IMPRESSION: 1. Lines and tubes stable position. 2. Progressive atelectatic changes left lower lobe. Associated left lower lobe infiltrate cannot be excluded . Electronically Signed   By: Marcello Moores  Register   On: 12/08/2015 07:27   Portable Chest Xray  Result Date: 12/07/2015 CLINICAL DATA:  Decreased oxygen saturation EXAM: PORTABLE CHEST 1 VIEW COMPARISON:  12/07/2015 FINDINGS: AP portable semi-erect view of the chest. Endotracheal tube tip is approximately 4.9 cm superior to the carina. Hazy opacity at the left lung base, likely atelectasis. This finding is unchanged. Possible tiny left effusion. No new consolidation. Cardiomediastinal silhouette stable. No pneumothorax. Left-sided central venous catheter tip overlies the SVC. IMPRESSION: 1. Support lines and tubes as above. 2. No significant interval change in hazy atelectasis or infiltrate at the left base. Electronically Signed   By: Donavan Foil M.D.   On: 12/07/2015 22:55   Dg Chest Port 1 View  Result Date: 12/07/2015 CLINICAL DATA:  Respiratory failure. EXAM: PORTABLE CHEST 1 VIEW COMPARISON:  12/05/2015. FINDINGS: Endotracheal tube left subclavian line in stable position. Heart size normal. Stable mild atelectasis left lung base. No pleural effusion or pneumothorax . IMPRESSION: 1. Lines and tubes in stable position. 2. Stable mild  left base atelectasis. Electronically Signed   By: Marcello Moores  Register   On: 12/07/2015 07:32   Dg Chest Port 1 View  Result Date: 12/05/2015 CLINICAL DATA:  Acute respiratory failure EXAM: PORTABLE CHEST 1 VIEW COMPARISON:  12/03/2015 FINDINGS: Endotracheal tube and left subclavian central venous catheter are again noted and stable. Cardiac shadow is stable. The lungs are well-aerated without focal infiltrate. Minimal left basilar atelectasis is seen. No bony abnormality is seen. IMPRESSION: Stable left basilar atelectasis. Tubes and lines as described. Electronically Signed   By: Inez Catalina M.D.   On: 12/05/2015 07:17   Dg Chest Port 1 View  Result Date: 12/04/2015 CLINICAL DATA:  75 year old male status post endotracheal tube placement. EXAM: PORTABLE CHEST 1 VIEW COMPARISON:  Chest radiograph dated 11/29/2015 FINDINGS: Endotracheal tube the tip approximately 5 cm above the carina. Left subclavian central venous line with tip over central SVC. Left lung base linear atelectasis versus less likely infiltrate. The right lung is clear. No pleural effusion or pneumothorax. The cardiac silhouette is within normal limits. No acute osseous pathology. IMPRESSION: Interval placement of an endotracheal and left subclavian central line with tips in appropriate positioning. No pneumothorax. Electronically Signed   By: Anner Crete M.D.   On: 12/04/2015 04:02   Dg Abd Acute W/chest  Result Date: 11/29/2015 CLINICAL DATA:  Abdominal distension. EXAM: DG ABDOMEN ACUTE W/ 1V CHEST COMPARISON:  CT scan November 25, 2015 FINDINGS: A right PICC line terminates in the central SVC. No pneumothorax. The heart, hila, mediastinum, lungs, and pleura are normal. No free air, portal venous gas, or pneumatosis. The NG tube terminates in the gastric antrum. The stomach is distended with air and fluid. Previous cholecystectomy. No small bowel or colonic dilatation to suggest obstruction. No other acute abnormalities.  IMPRESSION: 1. The stomach remains distended with air and fluid  despite the NG tube. Electronically Signed   By: Dorise Bullion III M.D   On: 11/29/2015 10:24   Dg Abd Portable 1v  Result Date: 12/07/2015 CLINICAL DATA:  Evaluate percutaneous jejunostomy tube. EXAM: PORTABLE ABDOMEN - 1 VIEW COMPARISON:  12/02/2015 FINDINGS: There is oral contrast in the jejunum in the left upper quadrant. I do not see any definite leaking intraperitoneal contrast. There is some aching contrast laterally which appears to be in the abdominal wall. IMPRESSION: Leakage of contrast near the percutaneous site, likely in the abdominal wall. No definite intraperitoneal leakage of contrast. Electronically Signed   By: Marijo Sanes M.D.   On: 12/07/2015 11:59   Dg Duanne Limerick  W/kub  Result Date: 11/12/2015 CLINICAL DATA:  History of gastric outlet obstruction with distal gastrectomy and Billroth 1 anastomosis by surgical history. EXAM: WATER SOLUBLE UPPER GI SERIES TECHNIQUE: Single-column upper GI series was performed using water soluble contrast. CONTRAST:  Water-soluble contrast COMPARISON:  CT abdomen pelvis of 11/09/2015 FLUOROSCOPY TIME:  Fluoroscopy Time:  54 seconds Radiation Exposure Index (if provided by the fluoroscopic device): 67 deciGy per square cm Number of Acquired Spot Images: 0 FINDINGS: A preliminary KUB shows massive distention of the stomach which appears to be fluid filled. Surgical clips are noted in the right upper quadrant. The remainder of the distal bowel is unremarkable. Initially rapid sequence spot films of the cervical esophagus were performed in the frontal projection. The swallowing mechanism is unremarkable. Again in the erect position there is a large amount of fluid distending the stomach. Esophageal peristalsis is unremarkable. However there is so much fluid in the stomach that the water-soluble contrast is considerably diluted, resulting in poor definition of anatomy. Therefore, I contacted Dr. Lavone Orn and discussed the findings. He will contact the gastroenterologist and consider endoscopy to assess further. IMPRESSION: There is considerable fluid distention of the stomach consistent with persistent gastric outlet obstruction. This study is very limited as described above due to dilution of contrast by the large amount of fluid remaining in the stomach. Dr. Laurann Montana will contact the gastroenterologist and consider endoscopy to evaluate further. Electronically Signed   By: Ivar Drape M.D.   On: 11/12/2015 09:37    Note: This dictation was prepared with Dragon/digital dictation along with [employer]. Any transcriptional errors that result from this process are unintentional.

## 2015-12-10 NOTE — Progress Notes (Addendum)
Pt. Cleaned up from 2nd bloody stool from rectum approx. 200 cc. At 1430. CCM MD made aware. Will continue to monitor. Repeat hgb done at 1515 was 7.7. Repeat hgb to be done at 1800. Dr. Michaell CowingGross aware of CT results. Will continue to monitor and follow up.

## 2015-12-10 NOTE — Progress Notes (Signed)
Nutrition Follow-up  DOCUMENTATION CODES:   Severe malnutrition in context of acute illness/injury  INTERVENTION:  - TPN per pharmacy. - RD will follow-up 10/27.  NUTRITION DIAGNOSIS:   Inadequate oral intake related to inability to eat as evidenced by NPO status. -ongoing  GOAL:   Patient will meet greater than or equal to 90% of their needs -unmet with TF held, current TPN rate.  MONITOR:   Vent status, Weight trends, Labs, Skin, I & O's, Other (Comment) (TPN regimen)  ASSESSMENT:   75 year old male presenting for a post-operative visit. Note for "Post-Operative": Patient returns one month status post robotic resection.  Distal gastrectomy with Billroth I gastroduodenal reconstruction for chronic ulcer causing obstruction.  09/25/2015  10/26 Consult for TPN received yesterday after full follow-up note. TF remains on hold. Pt with NGT with 100cc very dark drainage at time of RD visit this AM. Spoke with RN at bedside who reports G-tube and J-tube also to be set to drain, but plan at this time for administration of contrast via J-tube for planned CT scan.   Estimated nutrition needs updated this AM. Weight -1.3 kg from yesterday with Lasix. Pt currently receiving Clinimix E 5/15 @ 40 mL/hr with lipids held per ICU protocol. This regimen is providing 48 grams of protein, 682 kcal.   Based on national shortage of TPN, goal rate for TPN per pharmacy planned to be Clinimix E 5/15 @ 83 mL/hr (2 L/day) which will provide 100 grams of protein (91% minimum estimated protein need), 1414 kcal (74% estimated kcal need). Once 20% lipids @ 10 mL/hr are added, this will provide additional 480 kcal/day to meet 99.6% estimated kcal need.  Dr. Michaell CowingGross and Dr. Kendrick FriesMcQuaid currently talking with family at bedside. Will follow-up tomorrow for POC.   Patient is currently intubated on ventilator support MV: 9.5  L/min Temp (24hrs), Avg:97.5 F (36.4 C), Min:96.4 F (35.8 C), Max:98.4 F (36.9  C)  Medications reviewed; 10 mg Dulcolax once/day, 20 mg IV Pepcid/day, sliding scale Novolog, PRN IV Zofran, 10 mEq IV KCl x4 runs today. Labs reviewed;  CBGs: 141-171 mg/dL, Na: 401146 mmol/L, K: 3.1 mmol/L, Cl: 118 mmol/L, BUN: 50 mg/dL, creatinine: 1.6 mg/dL, Ca: 7.4 mg/dL, GFR: 40 mL/min.   Drips: Levo @ 18 mcg/min, Heparin @ 1300 units/hr, Precedex @ 0.3 mcg/kg/hr.     10/25 - Notes from Dr. Michaell CowingGross and CCM NP this AM reviewed.  - G-tube currently to suction with 300cc drainage present in canister and J-tube to gravity.  - Spoke with RN at bedside who reports TF via J-tube turned off and G- and J-tubes adjusted accordingly based on fecal-appearing matter around tubing.  - Estimated nutrition needs updated this AM and based on CBW as weight trending down with Lasix.  - No PICC in place and plan to replace only if unable to provide TF for 7 days.   Patient is currently intubated on ventilator support with OGT in place.  MV: 11.3 L/min Temp (24hrs), Avg:97.9 F (36.6 C), Min:96.9 F (36.1 C), Max:98.7 F (37.1 C) BP: 92/35 and MAP: 48 Drips: Heparin @ 1300 units/hr, Precedex @ 0.2 mcg/kg/hr.    10/24 - Pt was extubated late morning yesterday and was subsequently re-intubated ~2120 yesterday d/t poor ability to clear secretions.  - Estimated kcal need updated this AM and based on weight from 10/20 (95.8 kg); weight +1.4 kg since that time and is down from yesterday with Lasix order in place.  - TF initiated via J-tube  yesterday.  - Pt currently receiving Vital 1.2 @ 30 mL/hr via J-tube which is providing 864 kcal, 54 grams of protein, and 584 mL free water.  - Per RN note from today at 0721, pt with green/brown bile-like liquid around gastric tube at 2030. G Tube currently to wall suction and no new orders given. - Per Dr. Ulyses Jarred note this AM, pt now off of pressors. Also per note, possible trach if pt does not improve.  - Per Dr. Gordy Savers note yesterday at 1209: We'll start very  low dose rate tube feeds. Just 10 mL/ hour. Follow clinically. Advance to 79mL/hour the next day if doing well, then advance to goal once hestarts having bowel movements. Maybe start giving liquid meds through the jejunostomy tube in a few days if tolerates tube feeds, arguing against any leak or concern.  - Goal rate for TF via J-tube: Vital 1.2 @ 70 mL/hr to provide 2013 kcal, 126 grams of protein, and 1362 mL free water.  Patient is currently intubated on ventilator support MV: 13.1L/min Temp (24hrs), Avg:98.2 F (36.8 C), Min:98.1 F (36.7 C), Max:98.3 F (36.8 C) BP: 150/36 and MAP: 71 Drip:Heparin @ 1300 units/hr.    Diet Order:  Diet NPO time specified TPN (CLINIMIX-E) Adult  Skin:  Wound (see comment) (Abdominal incision)  Last BM:  10/24  Height:   Ht Readings from Last 1 Encounters:  12/08/15 6\' 2"  (1.88 m)    Weight:   Wt Readings from Last 1 Encounters:  12/10/15 201 lb 11.5 oz (91.5 kg)    Ideal Body Weight:  80.9 kg  BMI:  Body mass index is 25.9 kg/m.  Estimated Nutritional Needs:   Kcal:  1901  Protein:  110-137 grams (1.2-1.5 grams/kg)  Fluid:  per MD/NP/Surgery  EDUCATION NEEDS:   No education needs identified at this time    Trenton Gammon, MS, RD, LDN Inpatient Clinical Dietitian Pager # [phone] After hours/weekend pager # [phone]

## 2015-12-10 NOTE — Progress Notes (Signed)
eLink Physician-Brief Progress Note Patient Name: Danny GalRalph T [name] DOB: 24-Mar-1940 MRN: [id number]   Date of Service  12/10/2015  HPI/Events of Note  Multiple issues: 1. 28 beat run of SVT and 2. Pain.  eICU Interventions  Will order: 1. BMP and Mg++ level now.  2. Increase Fentanyl IV dose to Q 1 hour PRN pain.      Intervention Category Major Interventions: Arrhythmia - evaluation and management Intermediate Interventions: Pain - evaluation and management  [name] 12/10/2015, 5:12 PM

## 2015-12-10 NOTE — Progress Notes (Signed)
[facility] called for pt. The Patient's RUQ JP drain#1 has new drainage. Since 1800 pt has put 30cc of thick purulent tan malodorous drainage. Dr Ezzard Standingnewman is the on call surgeon and per him, continue with current plan of treatment. No new orders at this time. Will relay message to receiving nurse and continue to monitor patient.

## 2015-12-10 NOTE — Consult Note (Signed)
WOC Nurse wound consult note Reason for Consult: follow up for dual-tube issues.  Patient seen earlier this week (12/07/15) and a durable skin protectant was applied (acrylate with 72 hour wear time) at that time. Patient's abdomen is no worse and may be lightly improved than on that assessment, but not significantly. He has just had a large grossly bloody stool and is headed for diagnostic studies at this time.  I have just applied another coating of the durable skin protectant to both sides of the abdomen, the peritube areas. I do not see where pouching tubes is going to be an option due to the proximity of the insertion sites.  We may be able to apply a solid skin barrier or thick, zinc-based skin protectants as an alternative.  I will see again omorrow. WOC nursing team will follow, and will remain available to this patient, the nursing, surgical and medical teams.  Thanks, Ladona MowLaurie Abdishakur Gottschall, MSN, RN, GNP, Hans EdenCWOCN, CWON-AP, FAAN  Pager# [phone]

## 2015-12-10 NOTE — Progress Notes (Signed)
PCCM PROGRESS NOTE  Admission date: 11/24/2015 Consult date: 12/03/2015 Referring provider: Dr. Michaell Cowing  CC: Abdominal pain  BRIEF:  75 y/o male with peptic ulcer disease admitted on 10/10 with recurrent gastric outlet obstruction secondary to gastritis admitted underwent G and J tube placement this admission.  On 10/19 had to go back to the OR after he removed his G tube accidentally leading to peritonitis, intubation, septic shock.  Subjective:  Pulled on ETT last night Weaning On precedex On levophed On TPN Cr stable WBC stable   Vital signs: BP (!) 130/41   Pulse 66   Temp (!) 96.4 F (35.8 C) (Axillary)   Resp 14   Ht 6\' 2"  (1.88 m)   Wt 201 lb 11.5 oz (91.5 kg)   SpO2 100%   BMI 25.90 kg/m   Intake/output: I/O last 3 completed shifts: In: 2758.6 [I.V.:1876.8; Other:20; NG/GT:481.8; IV Piggyback:380] Out: 4455 [Urine:2025; Drains:2430]   Vent settings: Vent Mode: PRVC FiO2 (%):  [30 %-35 %] 30 % Set Rate:  [14 bmp] 14 bmp Vt Set:  [660 mL] 660 mL PEEP:  [5 cmH20] 5 cmH20 Plateau Pressure:  [9 cmH20-15 cmH20] 9 cmH20  General: chronically ill appearing, on vent HEENT: NCAT, ETT in place PULM: Rhonchi bilaterally CV: RRR, few irregular beats, no murmur ABD: Three abdominal drains, G without drainage, J without drainage MSK: normal bulk and tone Derm: anasarca, thin skin Neuro: Asleep, stirs to touch, doesn't follow commands, intermittently reaches for endotracheal tube   CMP Latest Ref Rng & Units 12/10/2015 12/09/2015 12/08/2015  Glucose 65 - 99 mg/dL 161(W) 960(A) 540(J)  BUN 6 - 20 mg/dL 81(X) 91(Y) 78(G)  Creatinine 0.61 - 1.24 mg/dL 9.56(O) 1.30(Q) 6.57  Sodium 135 - 145 mmol/L 146(H) 144 141  Potassium 3.5 - 5.1 mmol/L 3.1(L) 3.4(L) 3.6  Chloride 101 - 111 mmol/L 118(H) 117(H) 115(H)  CO2 22 - 32 mmol/L 22 21(L) 20(L)  Calcium 8.9 - 10.3 mg/dL 7.4(L) 7.3(L) 7.2(L)  Total Protein 6.5 - 8.1 g/dL 8.4(O) 4.3(L) -  Total Bilirubin 0.3 - 1.2 mg/dL  0.6 1.1 -  Alkaline Phos 38 - 126 U/L 117 120 -  AST 15 - 41 U/L 25 24 -  ALT 17 - 63 U/L 20 21 -    CBC Latest Ref Rng & Units 12/10/2015 12/09/2015 12/09/2015  WBC 4.0 - 10.5 K/uL 15.0(H) - 18.2(H)  Hemoglobin 13.0 - 17.0 g/dL 8.0(L) 8.0(L) 8.6(L)  Hematocrit 39.0 - 52.0 % 23.2(L) 23.2(L) 25.6(L)  Platelets 150 - 400 K/uL 447(H) - 283   ABG    Component Value Date/Time   PHART 7.344 (L) 12/07/2015 2230   PCO2ART 31.4 (L) 12/07/2015 2230   PO2ART 132 (H) 12/07/2015 2230   HCO3 16.7 (L) 12/07/2015 2230   TCO2 26 12/04/2007 0638   ACIDBASEDEF 7.6 (H) 12/07/2015 2230   O2SAT 98.3 12/07/2015 2230   CBG (last 3)   Recent Labs  12/10/15 0038 12/10/15 0353 12/10/15 0755  GLUCAP 171* 174* 141*   Imaging: Dg Abd 1 View  Result Date: 12/10/2015 CLINICAL DATA:  Check gastric catheter placement EXAM: ABDOMEN - 1 VIEW COMPARISON:  12/07/2015 FINDINGS: A gastric catheter is now seen in the distal esophagus. The tip does not appear to cross the gastroesophageal junction. A jejunostomy catheter is noted in the left mid abdomen. Surgical drain is noted on right. Scattered large and small bowel gas is noted. No free air is seen. IMPRESSION: Gastric catheter in the distal esophagus. This should be advanced into  the stomach. Electronically Signed   By: Alcide CleverMark  Lukens M.D.   On: 12/10/2015 08:03   Dg Chest Port 1 View  Result Date: 12/10/2015 CLINICAL DATA:  Readjusted ETT tube EXAM: PORTABLE CHEST 1 VIEW COMPARISON:  12/09/2015 FINDINGS: Endotracheal tube tip is approximately 4.7 cm superior to the carina. Left-sided central venous catheter tip overlies the SVC. Left CP angle is not included. Hazy left base atelectasis or infiltrate unchanged. Stable cardiomediastinal silhouette. No pneumothorax. IMPRESSION: 1. Support lines and tubes as above 2. Stable hazy left basilar atelectasis or infiltrate Electronically Signed   By: Jasmine PangKim  Fujinaga M.D.   On: 12/10/2015 00:36   Dg Chest Port 1  View  Result Date: 12/09/2015 CLINICAL DATA:  Respiratory failure. EXAM: PORTABLE CHEST 1 VIEW COMPARISON:  12/08/2015. FINDINGS: Endotracheal tube and left subclavian line stable position. Heart size stable. Persistent atelectatic changes left lung base. Associated left lower lobe infiltrate cannot be excluded. No prominent pleural effusion or pneumothorax. IMPRESSION: 1. Lines and tubes in stable position. 2. Persistent atelectatic changes left lung base. Mild left base infiltrate cannot be excluded. No change from prior exam . Electronically Signed   By: Maisie Fushomas  Register   On: 12/09/2015 07:04  PCXR w/ minimal L base atx. Aeration improved.   Studies: 10/11 CT abdomen/pelvis> significant distension of the stomach with layering fluid and gas, NG present, worrisome for recurrent gastric outlet obstruction.  RLL consolidation, cholecycstectomy 10/26 CT abdomen/pelvis>   Cultures: Blood 10/19 >> neg  Antibiotics: Zosyn 10/19 >>  Anidulafungin 10/19 >> 10/26 (stop date)  Events: 10/10  admission 10/17  Lap placement of J & G tubes, EGD w/ balloon dilation of duodenal stricture, gastric biopsies, closure of gastrotomy 10/18  sundowning, pulled out G tube 10/19  laparaoscopy (findings gastric leak, bilious ascites, jejunal disruption), omentoplexy of jejunal disruption, gastric leak 10/24 extubated. Required re-intubation.  10/25: placed on precedex for acute encephalopathy; new drainage from J tube. tubefeeds stopped.    Lines/tube: 10/19 ETT >> 10/24 then replaced 10/24 >  10/19 CVL left subclavian >>  10/19 R brachial arterial line >> 10/20 10/19 abdominal drain x3 >> 10/19 G tube >> 10/19 J tube >>  Summary: 75 yo male former smoker with progressive epigastric pain, nausea, vomiting, bloating from gastric outlet obstruction with pyloric channel ulcer.  Had Billroth 1, vagotomy, fundoplication, omentopexy August 2017 with initial improvement.  Had recurrent symptoms September 2017  that become progressively worse with gastric dilation from recurrent stricture.  Had laparoscopic placement of jejunostomy tube, gastrostomy tube, and EGD balloon dilation of duodenal stricture 10/17.  Developed gastric leakage with peritonitis after pulling out PEG tube 10/19 and taken to OR. Now back on vent after self extubation on 10/24. Major barrier appeared to be delirium and ability handle secretions, deconditioning and malnutrition. Vent mechanics remain stable, but mental status barrier to extubation.  He will need a tracheostomy.  PMHx of CAD s/p stent, GERD, HTN, HLD  Assessment/plan:  Acute respiratory failure > normal vent mechanics, not able to protect airway - PSV as long as tolerated today - minimize sedating medications - f/u CXR - plan tracheostomy  Septic shock 2nd to peritonitis > back on pressors 10/25-10/26, suspect this is due to precedex Hx of CAD, HTN, HLD - continue levophed - MAP goal > 55/ CVP >8 - albumin and lasix today, OK to continue levophed - continue IV fluids - hold outpt asa, lipitor, diovan  Recurrent gastric outlet obstruction Peritonitis Nutrition needs - post-op care, nutrition per  CCS - holding tubefeeds - Day 7 of zosyn, anidulafungin > continue zosyn after today, then f/u CT results  AKI >> stable in setting of anasarca - albumin today - lasix today given anasarca - monitor renal function, urine outpt - renal adjust meds   Acute metabolic encephalopathy > minimal - RASS goal -1 - wean off precedex - PRN fent, minimize versed - may need levo to support BP w/ precedex - scheduled tylenol   Rt index finger ischemia likely from Rt brachial a line. > improved, no clot on upper ext doppler ultrasound - VVS consulted 10/20, appreciate input - IV heparin > d/c today per vascular - plan to repeat arterial duplex to decide on duration of heparin   Hyperglycemia - SSI  Anasarca - lasix and albumin today  Protein malnutrition - per  Surgery  SUP - Protonix DVT prophylaxis - SCDs Goals of care - Full code  CCM time 35 minutes  [place], MD [facility] Pager: [phone] Cell: [phone] After 3pm or if no response, call [phone]

## 2015-12-10 NOTE — Progress Notes (Signed)
Date:  December 10, 2015 Chart reviewed for concurrent status and case management needs. Will continue to follow the patient for status change: remains on full vent support poss. Tracheostomy planned for today or tomorrow Discharge Planning: following for needs Expected discharge date: [id number] Marcelle SmilingRhonda Davis, BSN, [place]RN3, [place]CCM   [phone]

## 2015-12-10 NOTE — Progress Notes (Signed)
eLink Physician-Brief Progress Note Patient Name: Danny GalRalph T [name] DOB: 1940/09/26 MRN: [id number]   Date of Service  12/10/2015  HPI/Events of Note  Request for PICC line d/t Vasopressors, TPN and poor venous access.   eICU Interventions  Will order PICC line placed.      Intervention Category Intermediate Interventions: Other:  [name] 12/10/2015, 4:08 PM

## 2015-12-11 ENCOUNTER — Inpatient Hospital Stay (HOSPITAL_COMMUNITY): Payer: Medicare Other

## 2015-12-11 DIAGNOSIS — K6389 Other specified diseases of intestine: Secondary | ICD-10-CM

## 2015-12-11 DIAGNOSIS — E440 Moderate protein-calorie malnutrition: Secondary | ICD-10-CM

## 2015-12-11 DIAGNOSIS — Z934 Other artificial openings of gastrointestinal tract status: Secondary | ICD-10-CM

## 2015-12-11 DIAGNOSIS — I998 Other disorder of circulatory system: Secondary | ICD-10-CM

## 2015-12-11 HISTORY — DX: Other specified diseases of intestine: K63.89

## 2015-12-11 LAB — BASIC METABOLIC PANEL
Anion gap: 5 (ref 5–15)
BUN: 46 mg/dL — AB (ref 6–20)
CO2: 22 mmol/L (ref 22–32)
CREATININE: 1.51 mg/dL — AB (ref 0.61–1.24)
Calcium: 7.4 mg/dL — ABNORMAL LOW (ref 8.9–10.3)
Chloride: 116 mmol/L — ABNORMAL HIGH (ref 101–111)
GFR calc Af Amer: 50 mL/min — ABNORMAL LOW (ref >60)
GFR, EST NON AFRICAN AMERICAN: 43 mL/min — AB (ref >60)
Glucose, Bld: 143 mg/dL — ABNORMAL HIGH (ref 65–99)
Potassium: 3.4 mmol/L — ABNORMAL LOW (ref 3.5–5.1)
SODIUM: 143 mmol/L (ref 135–145)

## 2015-12-11 LAB — CBC
HCT: 21.4 % — ABNORMAL LOW (ref 39.0–52.0)
Hemoglobin: 7.3 g/dL — ABNORMAL LOW (ref 13.0–17.0)
MCH: 30.7 pg (ref 26.0–34.0)
MCHC: 34.1 g/dL (ref 30.0–36.0)
MCV: 89.9 fL (ref 78.0–100.0)
PLATELETS: 468 10*3/uL — AB (ref 150–400)
RBC: 2.38 MIL/uL — AB (ref 4.22–5.81)
RDW: 14.7 % (ref 11.5–15.5)
WBC: 16.4 10*3/uL — ABNORMAL HIGH (ref 4.0–10.5)

## 2015-12-11 LAB — GLUCOSE, CAPILLARY
GLUCOSE-CAPILLARY: 123 mg/dL — AB (ref 65–99)
Glucose-Capillary: 113 mg/dL — ABNORMAL HIGH (ref 65–99)
Glucose-Capillary: 122 mg/dL — ABNORMAL HIGH (ref 65–99)
Glucose-Capillary: 127 mg/dL — ABNORMAL HIGH (ref 65–99)
Glucose-Capillary: 129 mg/dL — ABNORMAL HIGH (ref 65–99)
Glucose-Capillary: 136 mg/dL — ABNORMAL HIGH (ref 65–99)

## 2015-12-11 LAB — PROTIME-INR
INR: 1.25
Prothrombin Time: 15.7 seconds — ABNORMAL HIGH (ref 11.4–15.2)

## 2015-12-11 LAB — LACTIC ACID, PLASMA: LACTIC ACID, VENOUS: 1.2 mmol/L (ref 0.5–1.9)

## 2015-12-11 MED ORDER — POTASSIUM CHLORIDE 10 MEQ/100ML IV SOLN
10.0000 meq | INTRAVENOUS | Status: AC
  Administered 2015-12-11 (×4): 10 meq via INTRAVENOUS
  Filled 2015-12-11 (×4): qty 100

## 2015-12-11 MED ORDER — FENTANYL CITRATE (PF) 100 MCG/2ML IJ SOLN
INTRAMUSCULAR | Status: AC
  Filled 2015-12-11: qty 4

## 2015-12-11 MED ORDER — MIDAZOLAM HCL 2 MG/2ML IJ SOLN
INTRAMUSCULAR | Status: AC
  Filled 2015-12-11: qty 6

## 2015-12-11 MED ORDER — FENTANYL CITRATE (PF) 100 MCG/2ML IJ SOLN
25.0000 ug | INTRAMUSCULAR | Status: AC | PRN
  Administered 2015-12-11 – 2015-12-12 (×5): 50 ug via INTRAVENOUS
  Filled 2015-12-11 (×3): qty 2

## 2015-12-11 MED ORDER — CHLORHEXIDINE GLUCONATE 0.12 % MT SOLN
OROMUCOSAL | Status: AC
  Administered 2015-12-11: 15 mL via OROMUCOSAL
  Filled 2015-12-11: qty 15

## 2015-12-11 MED ORDER — TRACE MINERALS CR-CU-MN-SE-ZN 10-1000-500-60 MCG/ML IV SOLN
60.0000 mL/h | INTRAVENOUS | Status: AC
  Administered 2015-12-11: 17:00:00 via INTRAVENOUS
  Filled 2015-12-11 (×2): qty 1440

## 2015-12-11 MED ORDER — FENTANYL CITRATE (PF) 100 MCG/2ML IJ SOLN
INTRAMUSCULAR | Status: AC | PRN
  Administered 2015-12-11: 50 ug via INTRAVENOUS

## 2015-12-11 MED ORDER — MIDAZOLAM HCL 2 MG/2ML IJ SOLN
INTRAMUSCULAR | Status: AC | PRN
  Administered 2015-12-11 (×2): 1 mg via INTRAVENOUS

## 2015-12-11 MED ORDER — FENTANYL CITRATE (PF) 100 MCG/2ML IJ SOLN
25.0000 ug | INTRAMUSCULAR | Status: DC | PRN
  Administered 2015-12-11 – 2015-12-13 (×5): 50 ug via INTRAVENOUS
  Administered 2015-12-13 – 2015-12-14 (×3): 25 ug via INTRAVENOUS
  Administered 2015-12-14 – 2015-12-16 (×8): 50 ug via INTRAVENOUS
  Administered 2015-12-17 (×2): 25 ug via INTRAVENOUS
  Administered 2015-12-17 – 2015-12-18 (×4): 50 ug via INTRAVENOUS
  Administered 2015-12-18 (×2): 25 ug via INTRAVENOUS
  Administered 2015-12-19 (×3): 50 ug via INTRAVENOUS
  Administered 2015-12-19: 25 ug via INTRAVENOUS
  Administered 2015-12-19 – 2015-12-20 (×8): 50 ug via INTRAVENOUS
  Filled 2015-12-11 (×37): qty 2

## 2015-12-11 MED ORDER — FAT EMULSION 20 % IV EMUL
240.0000 mL | INTRAVENOUS | Status: AC
  Administered 2015-12-11: 240 mL via INTRAVENOUS
  Filled 2015-12-11 (×2): qty 250

## 2015-12-11 MED ORDER — ALBUMIN HUMAN 25 % IV SOLN
50.0000 g | Freq: Once | INTRAVENOUS | Status: AC
  Administered 2015-12-11: 50 g via INTRAVENOUS
  Filled 2015-12-11: qty 50

## 2015-12-11 MED ORDER — FUROSEMIDE 10 MG/ML IJ SOLN
60.0000 mg | Freq: Once | INTRAMUSCULAR | Status: AC
  Administered 2015-12-11: 60 mg via INTRAVENOUS
  Filled 2015-12-11: qty 6

## 2015-12-11 NOTE — Procedures (Signed)
Bedside Tracheostomy Insertion Procedure Note   Patient Details:   Name: [name] DOB: 17-Sep-1940 MRN: [id number]  Procedure: Tracheostomy  Pre Procedure Assessment: ET Tube Size: 7.5 ET Tube secured at lip (cm): 26 Bite block in place: Yes Breath Sounds: Clear  Post Procedure Assessment: BP (!) 143/36   Pulse 66   Temp 97.6 F (36.4 C) (Oral)   Resp 14   Ht 6\' 2"  (1.88 m)   Wt 203 lb 0.7 oz (92.1 kg)   SpO2 100%   BMI 26.07 kg/m  O2 sats: stable throughout Complications: No apparent complications Patient did tolerate procedure well Tracheostomy Brand:Shiley Tracheostomy Style:Cuffed Tracheostomy Size: 6 Tracheostomy Secured WUJ:[id number]:Sutures, velcro Tracheostomy Placement Confirmation:Trach cuff visualized and in place and Chest X ray ordered for placement    Jacqulynn CadetHopper, Ariyah Sedlack David 12/11/2015, 9:18 AM

## 2015-12-11 NOTE — Procedures (Signed)
Interventional Radiology Procedure Note  Procedure:  CT guided drainage of intraabdominal abscess fluid collections x 2  Complications: None   Estimated Blood Loss: < 10 mL  Findings:  Collection along greater curvature of stomach yielded blood-tinged, clear fluid.  10 Fr drain placed and attached to suction bulb. Collection lateral to spleen yielded clear, yellowish fluid.  10 Fr drain placed and attached to suction bulb.  Jodi MarbleGlenn T. Fredia SorrowYamagata, M.D Pager:  [phone]

## 2015-12-11 NOTE — Evaluation (Signed)
TRACH TEAM ONLY, no needs Donavan Burnetamara Vinita Prentiss, MS [facility] SLP [phone]

## 2015-12-11 NOTE — Progress Notes (Signed)
   12/11/15 1400  Clinical Encounter Type  Visited With Patient and family together  Visit Type Initial;Psychological support;Spiritual support;Critical Care  Referral From Nurse  Consult/Referral To Chaplain  Spiritual Encounters  Spiritual Needs Emotional;Other (Comment) (Pastoral Conversation/Support)  Stress Factors  Patient Stress Factors Not reviewed  Family Stress Factors Major life changes;Health changes   I visited with the patient per Spiritual Care consult. The patient's wife and two friends were at the bedside. The patient was awake, but unable to answer me or acknowledge my presence.  The patient's wife said that yesterday was a hard day. She stated that it was difficult getting a lot of information from the doctors. The patient's wife states that today has been better. They were not in need of anything today, but I made them aware of the Spiritual Care resources that are available to them 24 hours a day.    Please contact Spiritual Care for further assistance.   Chaplain Clint BolderBrittany Lovie Zarling M.Div.

## 2015-12-11 NOTE — Consult Note (Signed)
Chief Complaint: Patient was seen in consultation today for CT guided drainage of abdominal fluid collections  Referring Physician(s): Gross,S  Supervising Physician: Irish LackYamagata, Glenn  [facility]  History of Present Illness: [name] is a 75 y.o. male with history significant for peptic ulcer disease, undergoing billroth I, vagotomy, fundoplication and omentopexy in Aug 2017, with noted symptom improvement.  Patient readmitted to hospital 10/10 for recurrent gastric outlet obstruction 2/2 gastritis, which was followed by G & J tube placement and balloon dilation of duodenal stricture on 10/17.  He returned to OR on 10/19 following accidental G tube removal by patient causing peritonitis, septic shock and intubation.  CT ordered 10/26 following new J tube drainage, revealing pneumatosis and probable SBO at mid jejunum with 2 fluid collections, 1- lateral to greater curvature, 2- lateral to spleen.  Consultation today for possible CT guided drainage of the abdominal fluid collections and assessment of loculated ascites vs abscess.  Past Medical History:  Diagnosis Date  . Bilateral dry eyes   . Coronary artery disease 1997   bare mental stent right coronart artery  . Degenerative disc disease    LOWER BACK  . Dysphonia   . ED (erectile dysfunction)   . Gastroesophageal reflux disease   . Hyperlipidemia   . Hypertension   . Peptic ulcer   . Pneumonia    HISTORY OF IN CHILDHOOD  . Posterior vitreous detachment 10/30/2012  . Urinary hesitancy   . Wears glasses     Past Surgical History:  Procedure Laterality Date  . CARDIAC CATHETERIZATION    . CHOLECYSTECTOMY    . ESOPHAGOGASTRODUODENOSCOPY N/A 12/01/2015   Procedure: ESOPHAGOGASTRODUODENOSCOPY (EGD) BALLOON DILATION OF DUODENAL STRICTURE;  Surgeon: Karie SodaSteven Gross, MD;  Location: [facility];  Service: General;  Laterality: N/A;  . ESOPHAGOGASTRODUODENOSCOPY (EGD) WITH PROPOFOL N/A 07/01/2015   Procedure:  ESOPHAGOGASTRODUODENOSCOPY (EGD) WITH PROPOFOL;  Surgeon: Willis ModenaWilliam Outlaw, MD;  Location: [facility];  Service: Endoscopy;  Laterality: N/A;  . ESOPHAGOGASTRODUODENOSCOPY (EGD) WITH PROPOFOL N/A 11/23/2015   Procedure: ESOPHAGOGASTRODUODENOSCOPY (EGD) WITH PROPOFOL;  Surgeon: Willis ModenaWilliam Outlaw, MD;  [facility];  Service: Endoscopy;  Laterality: N/A;  may need to intubate  . EYE SURGERY     growth on right eye removed   . LAPAROSCOPIC GASTROSTOMY N/A 12/01/2015   Procedure: LAPAROSCOPIC PLACEMENT OF FEEDING JEJUNOSTOMY AND GASTROSTOMY TUBE;  Surgeon: Karie SodaSteven Gross, MD;  Location: [facility];  Service: General;  Laterality: N/A;  . LAPAROSCOPY N/A 12/03/2015   Procedure: LAPAROSCOPY DIAGNOSTIC, OMENTOPEXY, JEJUNOSTOMY, WASH OUT;  Surgeon: Karie SodaSteven Gross, MD;  Location: [facility];  Service: General;  Laterality: N/A;  . STENT TO HEART     2 1997 AND 1 IN 1996  . TONSILLECTOMY    . XI ROBOTIC VAGOTOMY AND ANTRECTOMY N/A 09/25/2015   Procedure: XI ROBOTIC ANTERIOR AND POSTERIOR VAGOTOMY, BILROTH I  ANASTOMOSIS DOR FUNDIPLICATION, OMENTOPEXY  UPPER ENDOSCOPY;  Surgeon: Karie SodaSteven Gross, MD;  Location: [facility];  Service: General;  Laterality: N/A;    Allergies: Flomax [tamsulosin hcl]; Lisinopril; Lorazepam; and Uroxatral [alfuzosin hcl er]  Medications: Prior to Admission medications   Medication Sig Start Date End Date Taking? Authorizing Provider  ALPRAZolam Prudy Feeler(XANAX) 0.5 MG tablet Take 0.5 mg by mouth at bedtime as needed for sleep or anxiety. 10/02/15  Yes Historical Provider, MD  aspirin EC 81 MG tablet Take 81 mg by mouth every evening.    Yes Historical Provider, MD  atorvastatin (LIPITOR) 40 MG tablet Take 40 mg by  mouth at bedtime.    Yes Historical Provider, MD  calcium-vitamin D (OSCAL WITH D) 500-200 MG-UNIT tablet Take 1 tablet by mouth daily with breakfast.   Yes Historical Provider, MD  Cyanocobalamin (VITAMIN B-12) 1000 MCG SUBL Place 1 tablet under the tongue daily with breakfast.   Yes  Historical Provider, MD  Docusate Calcium (STOOL SOFTENER PO) Take 1 capsule by mouth daily as needed (for constipation).    Yes Historical Provider, MD  Multiple Vitamin (MULTIVITAMIN) tablet Take 2 tablets by mouth daily with breakfast.    Yes Historical Provider, MD  nitroGLYCERIN (NITROSTAT) 0.4 MG SL tablet Place 0.4 mg under the tongue every 5 (five) minutes as needed for chest pain.   Yes Historical Provider, MD  pantoprazole (PROTONIX) 40 MG tablet Take 40 mg by mouth 2 (two) times daily. 06/02/15  Yes Historical Provider, MD  Polyvinyl Alcohol-Povidone (REFRESH OP) Place 2 drops into both eyes as needed (For dry eyes.).    Yes Historical Provider, MD  RESTASIS 0.05 % ophthalmic emulsion Place 1 drop into both eyes 2 (two) times daily.  04/16/13  Yes Historical Provider, MD  valsartan (DIOVAN) 80 MG tablet Take 80 mg by mouth daily with breakfast.    Yes Historical Provider, MD  metoCLOPramide (REGLAN) 5 MG tablet Take 1-2 tablets (5-10 mg total) by mouth every 6 (six) hours as needed for nausea or vomiting. Patient not taking: Reported on 11/24/2015 10/01/15   Karie Soda, MD  traMADol (ULTRAM) 50 MG tablet Take 1-2 tablets (50-100 mg total) by mouth every 6 (six) hours as needed for moderate pain or severe pain. Patient not taking: Reported on 11/24/2015 10/01/15   Karie Soda, MD     Family History  Problem Relation Age of Onset  . Heart attack Mother     Social History   Social History  . Marital status: Married    Spouse name: N/A  . Number of children: N/A  . Years of education: N/A   Social History Main Topics  . Smoking status: Former Smoker    Packs/day: 2.00    Years: 20.00    Types: Cigarettes    Quit date: 02/15/1995  . Smokeless tobacco: Never Used  . Alcohol use No  . Drug use: No  . Sexual activity: Not Asked   Other Topics Concern  . None   Social History Narrative  . None      Review of Systems see above; pt with trach; hx obtained from  family/chart  Vital Signs: BP (!) 143/36   Pulse 66   Temp 97.6 F (36.4 C) (Oral)   Resp 14   Ht 6\' 2"  (1.88 m)   Wt 203 lb 0.7 oz (92.1 kg)   SpO2 100%   BMI 26.07 kg/m   Physical Exam pt now with trach, sedated; chest- scatt rhonchi; heart- occ ectopy, reg rate; abd- soft, sl dist; intact G/J tubes with small amt green colored drainage/erythema at sites; LE- no sig edema  Mallampati Score:     Imaging: Dg Abd 1 View  Result Date: 12/10/2015 CLINICAL DATA:  Check gastric catheter placement EXAM: ABDOMEN - 1 VIEW COMPARISON:  12/07/2015 FINDINGS: A gastric catheter is now seen in the distal esophagus. The tip does not appear to cross the gastroesophageal junction. A jejunostomy catheter is noted in the left mid abdomen. Surgical drain is noted on right. Scattered large and small bowel gas is noted. No free air is seen. IMPRESSION: Gastric catheter in the distal esophagus. This  should be advanced into the stomach. Electronically Signed   By: Alcide Clever M.D.   On: 12/10/2015 08:03   Dg Abd 1 View  Result Date: 12/02/2015 CLINICAL DATA:  75 year old male with gastrostomy tube removal and placement of a Foley catheter through the gastrostomy. EXAM: ABDOMEN - 1 VIEW COMPARISON:  Abdominal radiograph dated 11/29/2015 and CT dated 11/25/2015 FINDINGS: A percutaneous catheter is noted in the left upper abdomen. 25 cc of Gastrografin was administered through the catheter which appears to opacified the stomach. The tip of the catheter and the balloon noted within the stomach. Small amount of contrast noted extending along the tube and over the patient's skin. There has been interval resolution of the previously seen gastric distention. There is no bowel dilatation or evidence of obstruction. No free air identified. Right upper quadrant cholecystectomy clips noted. The soft tissues and osseous structures are unremarkable. IMPRESSION: Percutaneously placed Foley catheter through the gastrostomy  with tip and balloon appear in the stomach. Interval resolution of the previously seen air distended stomach. Electronically Signed   By: Elgie Collard M.D.   On: 12/02/2015 22:49   Ct Abdomen Pelvis W Contrast  Result Date: 12/10/2015 CLINICAL DATA:  One week postop from laparoscopy for gastric and jejunal perforation. Previous distal gastrectomy with Billroth 1 for ulcer disease. EXAM: CT ABDOMEN AND PELVIS WITH CONTRAST TECHNIQUE: Multidetector CT imaging of the abdomen and pelvis was performed using the standard protocol following bolus administration of intravenous contrast. CONTRAST:  80mL ISOVUE-300 IOPAMIDOL (ISOVUE-300) INJECTION 61% COMPARISON:  11/25/2015 FINDINGS: Lower Chest: New small pleural effusions and basilar atelectasis, left side greater than right. Hepatobiliary: No mass identified. Prior cholecystectomy noted. No evidence of biliary dilatation. Pancreas:  No mass or inflammatory changes. Spleen: Within normal limits in size and appearance. Adrenals/Urinary Tract: No masses identified. No evidence of hydronephrosis. Foley catheter seen within the urinary bladder which is decompressed. Stomach/Bowel: Nasogastric tube and percutaneous gastrostomy tube are seen in the stomach. Percutaneous jejunostomy tube seen in place as well as several surgical drains. Moderate dilatation of proximal and mid small bowel loops is seen with transition point in the anterior right lower quadrant on image 59/2, consistent with a partial small bowel obstruction. Distal small bowel loops are nondilated. Pneumatosis is seen involving multiple dilated proximal and mid small bowel loops in the left abdomen and pelvis, suspicious for bowel ischemia. There is no evidence of portal venous gas or free intraperitoneal air. A left upper quadrant fluid collection is seen along the lateral aspect of proximal stomach in the gastrohepatic ligament which measures 6.2 x 9.0 cm. A left subdiaphragmatic/perisplenic fluid  collection is also seen which measures 4.2 x 9.6 cm. These may represent postop fluid collections or abscesses. Small amount of free fluid noted in pelvic cul-de-sac. Vascular/Lymphatic: No pathologically enlarged lymph nodes. No abdominal aortic aneurysm. Aortic atherosclerosis. Reproductive:  No mass identified. Other:  Diffuse mesenteric and body wall edema. Musculoskeletal:  No suspicious bone lesions identified. IMPRESSION: Moderate dilatation of proximal and mid small bowel loops, with transition point in anterior right lower quadrant, suspicious for partial small bowel obstruction. Pneumatosis involving multiple dilated small bowel loops in the left abdomen pelvis, suspicious for bowel ischemia. No evidence of portal venous gas or free intraperitoneal air. Two left upper quadrant fluid collections adjacent to the stomach and spleen, which may represent postoperative fluid collections or abscesses. Small amount free fluid also noted in pelvis. Small bilateral pleural effusions and bibasilar atelectasis. Diffuse mesenteric and body wall edema.  Electronically Signed   By: Myles Rosenthal M.D.   On: 12/10/2015 15:30   Ct Abdomen Pelvis W Contrast  Result Date: 11/25/2015 CLINICAL DATA:  Partial gastrectomy 2 months ago. 50 pound weight loss. History of gastric outlet obstruction. Rule out leak or abscess. EXAM: CT ABDOMEN AND PELVIS WITH CONTRAST TECHNIQUE: Multidetector CT imaging of the abdomen and pelvis was performed using the standard protocol following bolus administration of intravenous contrast. CONTRAST:  ISOVUE-300 IOPAMIDOL (ISOVUE-300) INJECTION 61% COMPARISON:  None. FINDINGS: Lower chest: Patchy opacity posteriorly in the right lower lung, new since prior study. Cannot exclude area of early infiltrate. Left lung base is clear. Heart is normal size. Hepatobiliary: Prior cholecystectomy.  No focal hepatic abnormality. Pancreas: No focal abnormality or ductal dilatation. Spleen: No focal  abnormality.  Normal size. Adrenals/Urinary Tract: No adrenal abnormality. No focal renal abnormality. No stones or hydronephrosis. Urinary bladder is unremarkable. Stomach/Bowel: There is distention of the stomach with layering fluid and gas. NG tube tip is in the proximal to mid stomach. Postsurgical changes in the region of the distal stomach. Small bowel is decompressed. Scattered left colonic diverticula. No active diverticulitis. Vascular/Lymphatic: Diffuse aortoiliac atherosclerosis with calcifications. No aneurysm. No adenopathy. Reproductive: No visible focal abnormality. Other: No free fluid or free air. No focal fluid collection to suggest abscess. Musculoskeletal: No acute bony abnormality or focal bone lesion. IMPRESSION: Significant distention of the stomach with layering fluid and gas. NG tube is present in the proximal to mid stomach. Appearance concerning for Recurrent gastric outlet obstruction. Patchy new opacity noted posteriorly and right lower lobe. Cannot exclude early pneumonia. Prior cholecystectomy. Scattered left colonic diverticula.  No active diverticulitis. Aortoiliac atherosclerosis. These results will be called to the ordering clinician or representative by the Radiologist Assistant, and communication documented in the PACS or zVision Dashboard. Electronically Signed   By: Charlett Nose M.D.   On: 11/25/2015 16:05   Dg Chest Port 1 View  Result Date: 12/11/2015 CLINICAL DATA:  Respiratory failure, endotracheal tube position. EXAM: PORTABLE CHEST 1 VIEW COMPARISON:  12/09/2015 FINDINGS: Endotracheal tube unchanged with tip 5.2 cm above the carina. Left subclavian central venous catheter unchanged. Interval placement of nasogastric tube with side port just above the expected region of the gastroesophageal junction as tube courses into the region of the stomach and off the inferior portion of the film as tip is not visualized but likely over the stomach. Interval placement of  left-sided PICC line with tip over the SVC. Lungs are adequately inflated with stable subtle hazy density over the left base likely atelectasis. Cardiomediastinal silhouette and remainder of the exam is unchanged. IMPRESSION: Persistent subtle hazy density over the left base likely atelectasis. Tubes and lines as described. Electronically Signed   By: Elberta Fortis M.D.   On: 12/11/2015 07:35   Dg Chest Port 1 View  Result Date: 12/10/2015 CLINICAL DATA:  Readjusted ETT tube EXAM: PORTABLE CHEST 1 VIEW COMPARISON:  12/09/2015 FINDINGS: Endotracheal tube tip is approximately 4.7 cm superior to the carina. Left-sided central venous catheter tip overlies the SVC. Left CP angle is not included. Hazy left base atelectasis or infiltrate unchanged. Stable cardiomediastinal silhouette. No pneumothorax. IMPRESSION: 1. Support lines and tubes as above 2. Stable hazy left basilar atelectasis or infiltrate Electronically Signed   By: Jasmine Pang M.D.   On: 12/10/2015 00:36   Dg Chest Port 1 View  Result Date: 12/09/2015 CLINICAL DATA:  Respiratory failure. EXAM: PORTABLE CHEST 1 VIEW COMPARISON:  12/08/2015. FINDINGS: Endotracheal  tube and left subclavian line stable position. Heart size stable. Persistent atelectatic changes left lung base. Associated left lower lobe infiltrate cannot be excluded. No prominent pleural effusion or pneumothorax. IMPRESSION: 1. Lines and tubes in stable position. 2. Persistent atelectatic changes left lung base. Mild left base infiltrate cannot be excluded. No change from prior exam . Electronically Signed   By: Maisie Fus  Register   On: 12/09/2015 07:04   Dg Chest Port 1 View  Result Date: 12/08/2015 CLINICAL DATA:  Respiratory failure. EXAM: PORTABLE CHEST 1 VIEW COMPARISON:  12/07/2015. FINDINGS: Endotracheal to in stable position. Left subclavian line in stable position. Progressive atelectatic changes left lower lobe. Associated infiltrate cannot be excluded. No pleural  effusion or pneumothorax. IMPRESSION: 1. Lines and tubes stable position. 2. Progressive atelectatic changes left lower lobe. Associated left lower lobe infiltrate cannot be excluded . Electronically Signed   By: Maisie Fus  Register   On: 12/08/2015 07:27   Portable Chest Xray  Result Date: 12/07/2015 CLINICAL DATA:  Decreased oxygen saturation EXAM: PORTABLE CHEST 1 VIEW COMPARISON:  12/07/2015 FINDINGS: AP portable semi-erect view of the chest. Endotracheal tube tip is approximately 4.9 cm superior to the carina. Hazy opacity at the left lung base, likely atelectasis. This finding is unchanged. Possible tiny left effusion. No new consolidation. Cardiomediastinal silhouette stable. No pneumothorax. Left-sided central venous catheter tip overlies the SVC. IMPRESSION: 1. Support lines and tubes as above. 2. No significant interval change in hazy atelectasis or infiltrate at the left base. Electronically Signed   By: Jasmine Pang M.D.   On: 12/07/2015 22:55   Dg Chest Port 1 View  Result Date: 12/07/2015 CLINICAL DATA:  Respiratory failure. EXAM: PORTABLE CHEST 1 VIEW COMPARISON:  12/05/2015. FINDINGS: Endotracheal tube left subclavian line in stable position. Heart size normal. Stable mild atelectasis left lung base. No pleural effusion or pneumothorax . IMPRESSION: 1. Lines and tubes in stable position. 2. Stable mild left base atelectasis. Electronically Signed   By: Maisie Fus  Register   On: 12/07/2015 07:32   Dg Chest Port 1 View  Result Date: 12/05/2015 CLINICAL DATA:  Acute respiratory failure EXAM: PORTABLE CHEST 1 VIEW COMPARISON:  12/03/2015 FINDINGS: Endotracheal tube and left subclavian central venous catheter are again noted and stable. Cardiac shadow is stable. The lungs are well-aerated without focal infiltrate. Minimal left basilar atelectasis is seen. No bony abnormality is seen. IMPRESSION: Stable left basilar atelectasis. Tubes and lines as described. Electronically Signed   By: Alcide Clever M.D.   On: 12/05/2015 07:17   Dg Chest Port 1 View  Result Date: 12/04/2015 CLINICAL DATA:  75 year old male status post endotracheal tube placement. EXAM: PORTABLE CHEST 1 VIEW COMPARISON:  Chest radiograph dated 11/29/2015 FINDINGS: Endotracheal tube the tip approximately 5 cm above the carina. Left subclavian central venous line with tip over central SVC. Left lung base linear atelectasis versus less likely infiltrate. The right lung is clear. No pleural effusion or pneumothorax. The cardiac silhouette is within normal limits. No acute osseous pathology. IMPRESSION: Interval placement of an endotracheal and left subclavian central line with tips in appropriate positioning. No pneumothorax. Electronically Signed   By: Elgie Collard M.D.   On: 12/04/2015 04:02   Dg Abd Acute W/chest  Result Date: 11/29/2015 CLINICAL DATA:  Abdominal distension. EXAM: DG ABDOMEN ACUTE W/ 1V CHEST COMPARISON:  CT scan November 25, 2015 FINDINGS: A right PICC line terminates in the central SVC. No pneumothorax. The heart, hila, mediastinum, lungs, and pleura are normal. No free air,  portal venous gas, or pneumatosis. The NG tube terminates in the gastric antrum. The stomach is distended with air and fluid. Previous cholecystectomy. No small bowel or colonic dilatation to suggest obstruction. No other acute abnormalities. IMPRESSION: 1. The stomach remains distended with air and fluid despite the NG tube. Electronically Signed   By: Gerome Sam III M.D   On: 11/29/2015 10:24   Dg Abd Portable 1v  Result Date: 12/07/2015 CLINICAL DATA:  Evaluate percutaneous jejunostomy tube. EXAM: PORTABLE ABDOMEN - 1 VIEW COMPARISON:  12/02/2015 FINDINGS: There is oral contrast in the jejunum in the left upper quadrant. I do not see any definite leaking intraperitoneal contrast. There is some aching contrast laterally which appears to be in the abdominal wall. IMPRESSION: Leakage of contrast near the percutaneous site,  likely in the abdominal wall. No definite intraperitoneal leakage of contrast. Electronically Signed   By: Rudie Meyer M.D.   On: 12/07/2015 11:59   Dg Kayleen Memos  W/kub  Result Date: 11/12/2015 CLINICAL DATA:  History of gastric outlet obstruction with distal gastrectomy and Billroth 1 anastomosis by surgical history. EXAM: WATER SOLUBLE UPPER GI SERIES TECHNIQUE: Single-column upper GI series was performed using water soluble contrast. CONTRAST:  Water-soluble contrast COMPARISON:  CT abdomen pelvis of 11/09/2015 FLUOROSCOPY TIME:  Fluoroscopy Time:  54 seconds Radiation Exposure Index (if provided by the fluoroscopic device): 67 deciGy per square cm Number of Acquired Spot Images: 0 FINDINGS: A preliminary KUB shows massive distention of the stomach which appears to be fluid filled. Surgical clips are noted in the right upper quadrant. The remainder of the distal bowel is unremarkable. Initially rapid sequence spot films of the cervical esophagus were performed in the frontal projection. The swallowing mechanism is unremarkable. Again in the erect position there is a large amount of fluid distending the stomach. Esophageal peristalsis is unremarkable. However there is so much fluid in the stomach that the water-soluble contrast is considerably diluted, resulting in poor definition of anatomy. Therefore, I contacted Dr. [name] and discussed the findings. He will contact the gastroenterologist and consider endoscopy to assess further. IMPRESSION: There is considerable fluid distention of the stomach consistent with persistent gastric outlet obstruction. This study is very limited as described above due to dilution of contrast by the large amount of fluid remaining in the stomach. Dr. Valentina Lucks will contact the gastroenterologist and consider endoscopy to evaluate further. Electronically Signed   By: Dwyane Dee M.D.   On: 11/12/2015 09:37    Labs:  CBC:  Recent Labs  12/10/15 0418 12/10/15 1451  12/10/15 1730 12/11/15 0405  WBC 15.0* 14.4* 18.5* 16.4*  HGB 8.0* 7.7* 8.3* 7.3*  HCT 23.2* 22.6* 24.2* 21.4*  PLT 447* 446* 508* 468*    COAGS:  Recent Labs  12/04/15 2020 12/11/15 0405  INR 1.62 1.25  APTT 39*  --     BMP:  Recent Labs  12/09/15 0409 12/10/15 0418 12/10/15 1730 12/11/15 0405  NA 144 146* 143 143  K 3.4* 3.1* 3.0* 3.4*  CL 117* 118* 114* 116*  CO2 21* 22 22 22   GLUCOSE 132* 180* 141* 143*  BUN 45* 50* 49* 46*  CALCIUM 7.3* 7.4* 7.6* 7.4*  CREATININE 1.64* 1.60* 1.46* 1.51*  GFRNONAA 39* 40* 45* 43*  GFRAA 45* 47* 52* 50*    LIVER FUNCTION TESTS:  Recent Labs  12/03/15 0512 12/04/15 0930 12/09/15 0409 12/10/15 0418  BILITOT 0.6 0.7 1.1 0.6  AST 27 35 24 25  ALT 62 30 21  20  ALKPHOS 78 51 120 117  PROT 5.7* 4.0* 4.3* 4.5*  ALBUMIN 2.6* 1.8* 1.4* 1.5*    TUMOR MARKERS: No results for input(s): AFPTM, CEA, CA199, CHROMGRNA in the last 8760 hours.  Assessment and Plan:  TAELYN NEMES is a 75 y.o. male with history significant for peptic ulcer disease, undergoing billroth I, vagotomy, fundoplication and omentopexy in Aug 2017, with noted symptom improvement.  Patient readmitted to hospital 10/10 for recurrent gastric outlet obstruction 2/2 gastritis, which was followed by G & J tube placement and balloon dilation of duodenal stricture on 10/17.  He returned to OR on 10/19 following accidental G tube removal by patient causing peritonitis, septic shock and intubation.  CT ordered 10/26 following new J tube drainage, revealing pneumatosis and probable SBO at mid jejunum with 2 fluid collections, 1- lateral to greater curvature, 2- lateral to spleen.  Consultation today for possible CT guided drainage of the abdominal fluid collections and assessment of loculated ascites vs abscess. Imaging studies were reviewed by Dr. Fredia Sorrow.  Risks and benefits discussed with the patient's daughter/wife including bleeding, infection, damage to adjacent  structures, bowel perforation/fistula connection, and sepsis.  All of the patient/daughter's questions were answered, patient/daughter is agreeable to proceed.  Consent signed and in chart.Procedure scheduled for later today.    Thank you for this interesting consult.  I greatly enjoyed meeting CONST[facility] MANDEVILLE and look forward to participating in their care.  A copy of this report was sent to the requesting provider on this date.  Electronically Signed: D. Jeananne Rama 12/11/2015, 9:37 AM   I spent a total of 30 minutes in face to face in clinical consultation, greater than 50% of which was counseling/coordinating care for CT guided abdominal fluid collection drainage

## 2015-12-11 NOTE — Progress Notes (Signed)
Nutrition Follow-up  DOCUMENTATION CODES:   Severe malnutrition in context of acute illness/injury  INTERVENTION:  - Continue TPN per pharmacy. - Recommend Vital 1.2 @ 15 mL/hr when medically feasible (432 kcal, 27 grams of protein, and 292 mL free water). - RD will follow-up 10/29.  NUTRITION DIAGNOSIS:   Inadequate oral intake related to inability to eat as evidenced by NPO status. -ongoing  GOAL:   Patient will meet greater than or equal to 90% of their needs -unmet with current TPN regimen, TF not yet re-started.  MONITOR:   Vent status, Weight trends, Labs, I & O's, Other (Comment) (TPN regimen and ability to restart TF via J-tube)  ASSESSMENT:   75 year old male presenting for a post-operative visit. Note for "Post-Operative": Patient returns one month status post robotic resection.  Distal gastrectomy with Billroth I gastroduodenal reconstruction for chronic ulcer causing obstruction.  09/25/2015  10/27 Trach done this AM. Pt with NGT to suction with ~200cc very dark drainage present at time of RD visit; RN at bedside reports that this is all from this shift and that NGT was clamped for a short time d/t pt going to CT earlier today. Per notes, G-tube and J-tube to gravity.   Per Dr. Michaell CowingGross' note, tentative plan to initiate low-rate TF via J-tube once pt off pressors (pt not on pressors at time of RD visit). Note indicates preference for elemental TF; Vital 1.2 is a hydrolyzed peptide-based semi-elemental TF, no true elemental TF formulas available on formulary. Will have RD follow-up on 10/29 to monitor plan concerning nutrition support at that time.   Pt continues with TPN via PICC and is currently receiving Clinimix E 5/15 @ 40 mL/hr with 20% lipids @ 10 mL/hr which is providing 48 grams of protein, 1162 kcal. Per pharmacy note this AM, plan to increase TPN to Clinimix E 5/15 @ 60 mL/hr with continued 20% lipids @ 10 mL/hr. This regimen will provide 72 grams of protein (65%  minimum estimated protein need) and 1502 kcal (71.5% estimated kcal need).   Per CCM MD/NP notes, questionable bowel ischemia per GI surgery, pt with anasarca with albumin and Lasix ordered, peritoneal fluid collections with IR-guided drain down today. Weight +0.6 kg from yesterday.   Patient is currently intubated on ventilator support via trach MV: 12.1 L/min Temp (24hrs), Avg:98.7 F (37.1 C), Min:97.6 F (36.4 C), Max:99.7 F (37.6 C) BP: 164/65 and MAP: 87  Medications reviewed; 50 mg IV albumin x1 dose today, 20 mg IV Pepcid/day, 60 mg IV Lasix x1 dose today, sliding scale Novolog, PRN IV Zofran, 10 mEq IV KCl x4 runs yesterday.  Labs reviewed; CBGs: 123 and 126 mg/dL today, K: 3.4 mmol/L, Cl: 116 mmol/L, BUN: 46 mg/dL, creatinine: 9.141.51 mg/dL, Ca: 7.4 mg/dL, triglycerides yesterday: 214 mg/dL, GFR: 43 mL/min.     10/26 - Consult for TPN received yesterday after full follow-up note.  - TF remains on hold.  - Pt with NGT with 100cc very dark drainage at time of RD visit this AM.  - Spoke with RN at bedside who reports G-tube and J-tube to be set to gravity, but plan at this time for administration of contrast via J-tube for planned CT scan.  - Weight -1.3 kg from yesterday with Lasix.  - Pt currently receiving Clinimix E 5/15 @ 40 mL/hr with lipids held per ICU protocol.  - This regimen is providing 48 grams of protein, 682 kcal.  - Dr. Michaell CowingGross and Dr. Kendrick FriesMcQuaid currently talking with  family at bedside. Will follow-up tomorrow for POC.  Based on national shortage of TPN, goal rate for TPN per pharmacy planned to be Clinimix E 5/15 @ 83 mL/hr (2 L/day) which will provide 100 grams of protein (91% minimum estimated protein need), 1414 kcal (74% estimated kcal need). Once 20% lipids @ 10 mL/hr are added, this will provide additional 480 kcal/day to meet 99.6% estimated kcal need.  Patient is currently intubated on ventilator support MV: 9.5  L/min Temp (24hrs), Avg:97.5 F (36.4 C),  Min:96.4 F (35.8 C), Max:98.4 F (36.9 C) Drips: Levo @ 18 mcg/min, Heparin @ 1300 units/hr, Precedex @ 0.3 mcg/kg/hr.     10/25 - Notes from Dr. Michaell Cowing and CCM NP this AM reviewed.  - G-tube currently to suction with 300cc drainage present in canister and J-tube to gravity.  - Spoke with RN at bedside who reports TF via J-tube turned off and G- and J-tubes adjusted accordingly based on fecal-appearing matter around tubing.  - Estimated nutrition needs updated this AM and based on CBW as weight trending down with Lasix.  - No PICC in place and plan to replace only if unable to provide TF for 7 days.   Patient is currently intubated on ventilator support with OGT in place.  MV: 11.3L/min Temp (24hrs), Avg:97.9 F (36.6 C), Min:96.9 F (36.1 C), Max:98.7 F (37.1 C) BP: 92/35 and MAP: 48 Drips:Heparin @ 1300 units/hr, Precedex @ 0.2 mcg/kg/hr.     Diet Order:  TPN (CLINIMIX-E) Adult .TPN (CLINIMIX-E) Adult  Skin:  Wound (see comment) (Abdominal incision)  Last BM:  10/26  Height:   Ht Readings from Last 1 Encounters:  12/08/15 6\' 2"  (1.88 m)    Weight:   Wt Readings from Last 1 Encounters:  12/11/15 203 lb 0.7 oz (92.1 kg)    Ideal Body Weight:  80.9 kg  BMI:  Body mass index is 26.07 kg/m.  Estimated Nutritional Needs:   Kcal:  2099  Protein:  111-138 grams (1.2-1.5 grams/kg)  Fluid:  per MD/NP/Surgery  EDUCATION NEEDS:   No education needs identified at this time    Trenton Gammon, MS, RD, LDN Inpatient Clinical Dietitian Pager # [phone] After hours/weekend pager # [phone]

## 2015-12-11 NOTE — Progress Notes (Signed)
PCCM PROGRESS NOTE  Admission date: 11/24/2015 Consult date: 12/03/2015 Referring provider: Dr. Michaell CowingGross  CC: Abdominal pain  BRIEF:  75 y/o male with peptic ulcer disease admitted on 10/10 with recurrent gastric outlet obstruction secondary to gastritis admitted underwent G and J tube placement this admission.  On 10/19 had to go back to the OR after he removed his G tube accidentally leading to peritonitis, intubation, septic shock.  Subjective:  Awaiting trip to IR for drains.  Got trach this am   Vital signs: BP (!) 143/36   Pulse 66   Temp 97.6 F (36.4 C) (Oral)   Resp 14   Ht 6\' 2"  (1.88 m)   Wt 203 lb 0.7 oz (92.1 kg)   SpO2 100%   BMI 26.07 kg/m   Intake/output: I/O last 3 completed shifts: In: 3592.4 [I.V.:2692.4; Other:20; IV Piggyback:880] Out: 4810 [Urine:3400; Drains:1410]   Vent settings: Vent Mode: PRVC FiO2 (%):  [30 %] 30 % Set Rate:  [14 bmp] 14 bmp Vt Set:  [660 mL] 660 mL PEEP:  [5 cmH20] 5 cmH20 Pressure Support:  [8 cmH20] 8 cmH20 Plateau Pressure:  [7 cmH20-26 cmH20] 13 cmH20  General: chronically ill appearing, on vent HEENT: NCAT, #6 perc trach in place. Minimal bloody draiange PULM: clear w/ equal chest rise CV: RRR, few irregular beats, no murmur ABD: Three abdominal drains, G w/ brown purulent appearing bloody drainage. J tube also w/ old bloody drainage  MSK: normal bulk and tone Derm: anasarca, thin skin Neuro: Asleep, stirs to touch, doesn't follow commands, intermittently (got sedation for trach)  CMP Latest Ref Rng & Units 12/11/2015 12/10/2015 12/10/2015  Glucose 65 - 99 mg/dL 161(W143(H) 960(A141(H) 540(J180(H)  BUN 6 - 20 mg/dL 81(X46(H) 91(Y49(H) 78(G50(H)  Creatinine 0.61 - 1.24 mg/dL 9.56(O1.51(H) 1.30(Q1.46(H) 6.57(Q1.60(H)  Sodium 135 - 145 mmol/L 143 143 146(H)  Potassium 3.5 - 5.1 mmol/L 3.4(L) 3.0(L) 3.1(L)  Chloride 101 - 111 mmol/L 116(H) 114(H) 118(H)  CO2 22 - 32 mmol/L 22 22 22   Calcium 8.9 - 10.3 mg/dL 7.4(L) 7.6(L) 7.4(L)  Total Protein 6.5 - 8.1 g/dL -  - 4.5(L)  Total Bilirubin 0.3 - 1.2 mg/dL - - 0.6  Alkaline Phos 38 - 126 U/L - - 117  AST 15 - 41 U/L - - 25  ALT 17 - 63 U/L - - 20    CBC Latest Ref Rng & Units 12/11/2015 12/10/2015 12/10/2015  WBC 4.0 - 10.5 K/uL 16.4(H) 18.5(H) 14.4(H)  Hemoglobin 13.0 - 17.0 g/dL 7.3(L) 8.3(L) 7.7(L)  Hematocrit 39.0 - 52.0 % 21.4(L) 24.2(L) 22.6(L)  Platelets 150 - 400 K/uL 468(H) 508(H) 446(H)   ABG    Component Value Date/Time   PHART 7.344 (L) 12/07/2015 2230   PCO2ART 31.4 (L) 12/07/2015 2230   PO2ART 132 (H) 12/07/2015 2230   HCO3 16.7 (L) 12/07/2015 2230   TCO2 26 12/04/2007 0638   ACIDBASEDEF 7.6 (H) 12/07/2015 2230   O2SAT 98.3 12/07/2015 2230   CBG (last 3)   Recent Labs  12/10/15 2350 12/11/15 0316 12/11/15 0826  GLUCAP 144* 136* 123*   Imaging: Dg Abd 1 View  Result Date: 12/10/2015 CLINICAL DATA:  Check gastric catheter placement EXAM: ABDOMEN - 1 VIEW COMPARISON:  12/07/2015 FINDINGS: A gastric catheter is now seen in the distal esophagus. The tip does not appear to cross the gastroesophageal junction. A jejunostomy catheter is noted in the left mid abdomen. Surgical drain is noted on right. Scattered large and small bowel gas is noted. No free  air is seen. IMPRESSION: Gastric catheter in the distal esophagus. This should be advanced into the stomach. Electronically Signed   By: Alcide Clever M.D.   On: 12/10/2015 08:03   Ct Abdomen Pelvis W Contrast  Result Date: 12/10/2015 CLINICAL DATA:  One week postop from laparoscopy for gastric and jejunal perforation. Previous distal gastrectomy with Billroth 1 for ulcer disease. EXAM: CT ABDOMEN AND PELVIS WITH CONTRAST TECHNIQUE: Multidetector CT imaging of the abdomen and pelvis was performed using the standard protocol following bolus administration of intravenous contrast. CONTRAST:  80mL ISOVUE-300 IOPAMIDOL (ISOVUE-300) INJECTION 61% COMPARISON:  11/25/2015 FINDINGS: Lower Chest: New small pleural effusions and basilar  atelectasis, left side greater than right. Hepatobiliary: No mass identified. Prior cholecystectomy noted. No evidence of biliary dilatation. Pancreas:  No mass or inflammatory changes. Spleen: Within normal limits in size and appearance. Adrenals/Urinary Tract: No masses identified. No evidence of hydronephrosis. Foley catheter seen within the urinary bladder which is decompressed. Stomach/Bowel: Nasogastric tube and percutaneous gastrostomy tube are seen in the stomach. Percutaneous jejunostomy tube seen in place as well as several surgical drains. Moderate dilatation of proximal and mid small bowel loops is seen with transition point in the anterior right lower quadrant on image 59/2, consistent with a partial small bowel obstruction. Distal small bowel loops are nondilated. Pneumatosis is seen involving multiple dilated proximal and mid small bowel loops in the left abdomen and pelvis, suspicious for bowel ischemia. There is no evidence of portal venous gas or free intraperitoneal air. A left upper quadrant fluid collection is seen along the lateral aspect of proximal stomach in the gastrohepatic ligament which measures 6.2 x 9.0 cm. A left subdiaphragmatic/perisplenic fluid collection is also seen which measures 4.2 x 9.6 cm. These may represent postop fluid collections or abscesses. Small amount of free fluid noted in pelvic cul-de-sac. Vascular/Lymphatic: No pathologically enlarged lymph nodes. No abdominal aortic aneurysm. Aortic atherosclerosis. Reproductive:  No mass identified. Other:  Diffuse mesenteric and body wall edema. Musculoskeletal:  No suspicious bone lesions identified. IMPRESSION: Moderate dilatation of proximal and mid small bowel loops, with transition point in anterior right lower quadrant, suspicious for partial small bowel obstruction. Pneumatosis involving multiple dilated small bowel loops in the left abdomen pelvis, suspicious for bowel ischemia. No evidence of portal venous gas or  free intraperitoneal air. Two left upper quadrant fluid collections adjacent to the stomach and spleen, which may represent postoperative fluid collections or abscesses. Small amount free fluid also noted in pelvis. Small bilateral pleural effusions and bibasilar atelectasis. Diffuse mesenteric and body wall edema. Electronically Signed   By: Myles Rosenthal M.D.   On: 12/10/2015 15:30   Dg Chest Port 1 View  Result Date: 12/11/2015 CLINICAL DATA:  Tracheostomy tube EXAM: PORTABLE CHEST 1 VIEW COMPARISON:  400 hour FINDINGS: The endotracheal tube has been exchanged for a Shiley tracheostomy tube. Tip is 6.7 cm from the carina. Lungs are hyperaerated. Hazy opacity at the left base has improved. Normal heart size. No pneumothorax. Left subclavian central venous catheter and left PICC are stable. IMPRESSION: Tracheostomy tube as described. Improved hazy airspace disease at the left base. Electronically Signed   By: [name] M.D.   On: 12/11/2015 09:37   Dg Chest Port 1 View  Result Date: 12/11/2015 CLINICAL DATA:  Respiratory failure, endotracheal tube position. EXAM: PORTABLE CHEST 1 VIEW COMPARISON:  12/09/2015 FINDINGS: Endotracheal tube unchanged with tip 5.2 cm above the carina. Left subclavian central venous catheter unchanged. Interval placement of nasogastric tube with  side port just above the expected region of the gastroesophageal junction as tube courses into the region of the stomach and off the inferior portion of the film as tip is not visualized but likely over the stomach. Interval placement of left-sided PICC line with tip over the SVC. Lungs are adequately inflated with stable subtle hazy density over the left base likely atelectasis. Cardiomediastinal silhouette and remainder of the exam is unchanged. IMPRESSION: Persistent subtle hazy density over the left base likely atelectasis. Tubes and lines as described. Electronically Signed   By: Elberta Fortis M.D.   On: 12/11/2015 07:35   Dg  Chest Port 1 View  Result Date: 12/10/2015 CLINICAL DATA:  Readjusted ETT tube EXAM: PORTABLE CHEST 1 VIEW COMPARISON:  12/09/2015 FINDINGS: Endotracheal tube tip is approximately 4.7 cm superior to the carina. Left-sided central venous catheter tip overlies the SVC. Left CP angle is not included. Hazy left base atelectasis or infiltrate unchanged. Stable cardiomediastinal silhouette. No pneumothorax. IMPRESSION: 1. Support lines and tubes as above 2. Stable hazy left basilar atelectasis or infiltrate Electronically Signed   By: Jasmine Pang M.D.   On: 12/10/2015 00:36  PCXR w/ minimal L base atx. Aeration improved. Trach good position.   Studies: 10/11 CT abdomen/pelvis> significant distension of the stomach with layering fluid and gas, NG present, worrisome for recurrent gastric outlet obstruction.  RLL consolidation, cholecycstectomy 10/26 CT abdomen/pelvis> Moderate dilatation of proximal and mid small bowel loops, with transition point in anterior right lower quadrant, suspicious for partial small bowel obstruction. Pneumatosis involving multiple dilated small bowel loops in the left abdomen pelvis, suspicious for bowel ischemia. No evidence of portal venous gas or free intraperitoneal air. Two left upper quadrant fluid collections adjacent to the stomach and spleen, which may represent postoperative fluid collections or abscesses. Small amount free fluid also noted in pelvis.Small bilateral pleural effusions and bibasilar atelectasis. Diffuse mesenteric and body wall edema.  Cultures: Blood 10/19 >> neg  Antibiotics: Zosyn 10/19 >>  Anidulafungin 10/19 >> 10/26 (stop date)  Events: 10/10  admission 10/17  Lap placement of J & G tubes, EGD w/ balloon dilation of duodenal stricture, gastric biopsies, closure of gastrotomy 10/18  sundowning, pulled out G tube 10/19  laparaoscopy (findings gastric leak, bilious ascites, jejunal disruption), omentoplexy of jejunal disruption, gastric  leak 10/24 extubated. Required re-intubation.  10/25: placed on precedex for acute encephalopathy; new drainage from J tube. tubefeeds stopped.   10/26 Heparin off since this am (as recommended by vascular). Still on pressors. Getting albumin and lasix to a/w mobilizing third spaced fluid/anasarca. Developed rectal bleeding. This has initiated after starting bowel prep. Hgb has drifted from 8 to 7.7 --> stopped Whitney heparin. CT found evidence of bowel pneumotosis worrisome for ischemia, and two LUQ fluid collections 10/27: trach placed. TO IR for Perc drains to be placed in LUQ   Lines/tube: 10/19 ETT >> 10/24 then replaced 10/24 > 10/27 10/19 CVL left subclavian >>  10/19 R brachial arterial line >> 10/20 10/19 abdominal drain x3 >> 10/19 G tube >> 10/19 J tube >> 10/27 Trach placed (feinstein # 6 cuffed)  Summary: 75 yo male former smoker with progressive epigastric pain, nausea, vomiting, bloating from gastric outlet obstruction with pyloric channel ulcer.  Had Billroth 1, vagotomy, fundoplication, omentopexy August 2017 with initial improvement.  Had recurrent symptoms September 2017 that become progressively worse with gastric dilation from recurrent stricture.  Had laparoscopic placement of jejunostomy tube, gastrostomy tube, and EGD balloon dilation of duodenal stricture  10/17.  Developed gastric leakage with peritonitis after pulling out PEG tube 10/19 and taken to OR. On 10/27 found 2 new LUQ abscesses/fluid collections. Now going to IR. Got his trach this am.  Major barrier appeared to be delirium and ability handle secretions, deconditioning and malnutrition.  For today (10/27) IR for drain, then try PS trials and even ATC if able. . Stop diuretics given rising creatinine. For tomorrow (10/28) get OOB and have PT see him  PMHx of CAD s/p stent, GERD, HTN, HLD  Assessment/plan:  Acute respiratory failure > normal vent mechanics, not able to protect airway Tracheostomy status  (placed 10/27) due prolonged critical illness, deconditioning and severe protein calorie malnutrition.  - full vent support during IR visit then  Wean w/ PSV and assess to see if he can be placed on ATC  - minimize sedating medications - f/u CXR - cont routine trach care   Septic shock 2nd to peritonitis > back on pressors 10/25-10/26, suspect this was due to precedex Hx of CAD, HTN, HLD - MAP goal > 55/ CVP >8 - continue IV fluids - hold outpt asa, lipitor, diovan  Recurrent gastric outlet obstruction Peritonitis] New Left Upper quad X2 abscess/fluid collections Nutrition needs - post-op care, nutrition per CCS - holding tubefeeds - Day 8 of zosyn-->will continue - plan for IR to place drains in LUQ fluid collections 10/27  AKI >> worse. He is not volume overloaded. Suspect aggressive diuretics for anasarca have added to this.  Hypokalemia Hyperchloremia  - dc lasix and albumin  - replace K - monitor renal function, urine outpt - renal adjust meds   Acute metabolic encephalopathy > minimal Post-op pain Severe Physical deconditioning  - RASS goal -1 - PRN fent, minimize versed - scheduled tylenol  - get OOB and order PT consult 10/28  Rt index finger ischemia likely from Rt brachial a line. > improved, no clot on upper ext doppler ultrasound - VVS consulted 10/20, appreciate input-->resolved.  - no more Alines right arm   Hyperglycemia - SSI  Anasarca - cont nutritional support  - hold off on lasix today (10/27)  Protein malnutrition - per Surgery; cont TNA, anticipate tube feed trials again soon.   SUP - Protonix DVT prophylaxis - SCDs Goals of care - Full code   Simonne Martinet ACNP-BC [facility] Pulmonary/Critical Care Pager # [phone] OR # [phone] if no answer

## 2015-12-11 NOTE — Procedures (Signed)
PCCM Video Bronchoscopy Procedure Note  The patient was informed of the risks (including but not limited to bleeding, infection, respiratory failure, lung injury, tooth/oral injury) and benefits of the procedure and gave consent, see chart.  Indication: Image guidance for percutaneous tracheostomy  Post Procedure Diagnosis: Acute respiratory failure with hypoxemia  Location[facility]  Condition pre procedure: Critically ill on vent  Medications for procedure: Etomidate, versed, fentanyl, propofol  Procedure description: The bronchoscope was introduced through the endotracheal tube and passed to the bilateral lungs to the level of the subsegmental bronchi throughout the tracheobronchial tree.  Airway exam revealed normal appearing trachea, sharp carina, some thick mucus not obstructing airway along walls of the trachea.  The scope was used to retract the endotracheal tube and to facilitate imaging of the percutaneous tracheostomy.  Procedures performed: None  Specimens sent: None  Condition post procedure: Critically ill, on vent  EBL: None from bronchoscopy  Complications: none immediate  Heber CarolinaBrent Krystn Dermody, MD [place] PCCM Pager: [phone] Cell: [phone] After 3pm or if no response, call [phone]

## 2015-12-11 NOTE — Procedures (Signed)
Name:  Danny GalRalph T Danese MRN:  [id number] DOB:  10/21/1940  OPERATIVE NOTE  Procedure:  Percutaneous tracheostomy.  Indications:  Ventilator-dependent respiratory failure.  Consent:  Procedure, alternatives, risks and benefits discussed with medical POA.  Questions answered.  Consent obtained.  Anesthesia:  Prop, versed, etom, fent  Procedure summary:  Appropriate equipment was assembled.  The patient was identified as [name] and safety timeout was performed. The patient was placed in supine position with a towel roll behind shoulder blades and neck extended.  Sterile technique was used. The patient's neck and upper chest were prepped using chlorhexidine / alcohol scrub and the field was draped in usual sterile fashion with full body drape. After the adequate sedation / anesthesia was achieved, attention was directed at the midline trachea, where the cricothyroid membrane was palpated. Approximately two fingerbreadths above the sternal notch, a verticle  incision was created with a scalpel after local infiltration with 0.2% Lidocaine. Then, using Seldinger technique and a percutaneous tracheostomy set, the trachea was entered with a 14 gauge needle with an overlying sheath. This was all confirmed under direct visualization of a fiberoptic flexible bronchoscope. Entrance into the trachea was identified through the third tracheal ring interspace. Following this, a guidewire was inserted. The needle was removed, leaving the sheath and the guidewire intact. Next, the sheath was removed and a small dilator was inserted. The tracheal rings were then dilated. A #6 Shiley was then opened. The balloon was checked. It was placed over a tracheal dilator, which was then advanced over the guidewire and through the previously dilated tract. The Shiley tracheostomy tube was noted to pass in the trachea with little resistance. The guidewire and dilator tubes were removed from the trachea. An inner cannula was  placed through the tracheostomy tube. The tracheostomy was then secured at the anterior neck with 4 monofilament sutures. The oral endotracheal tube was removed and the ventilator was attached to the newly placed tracheostomy tube. Adequate tidal volumes were noted. The cuff was inflated and no evidence of air leak was noted. No evidence of bleeding was noted. At this point, the procedure was concluded. Post-procedure chest x-ray was ordered.  Complications:  No immediate complications were noted.  Hemodynamic parameters and oxygenation remained stable throughout the procedure.  Estimated blood loss:  Less then 1 mL.  Nelda BucksFEINSTEIN,Omeka Holben J., MD Pulmonary and Critical Care Medicine [facility] Pager: [phone]  12/11/2015, 9:13 AM  [facility] [phone]  Mcarthur Rossettianiel J. Tyson AliasFeinstein, MD, FACP Pgr: [phone] [facility] Pulmonary & Critical [facility]

## 2015-12-11 NOTE — Progress Notes (Addendum)
[facility]  [address] [place], [place] [address] Phone: [phone] FAX: [phone]   [name] [id number] 13-Jan-1941  CARE TEAM:  PCP: Irven Shelling, MD  Outpatient Care Team: Patient Care Team: Lavone Orn, MD as PCP - General (Internal Medicine) Michael Boston, MD as Consulting Physician (General Surgery) Arta Silence, MD as Consulting Physician (Gastroenterology)  Inpatient Treatment Team: Treatment Team: Attending Provider: Michael Boston, MD; Consulting Physician: Arta Silence, MD; Technician: Sueanne Margarita, NT; Consulting Physician: Md Pccm, MD; Rounding Team: Md Pccm, MD; Consulting Physician: Serafina Mitchell, MD; Registered Nurse: Sheron Nightingale, RN; Registered Nurse: Lynnell Dike, RN; Registered Nurse: Benny Lennert, RN; Chaplain: Hadley Pen, Chaplain; Respiratory Therapist: Nelly Laurence, RRT  SURGERY 12/03/2015  POST-OPERATIVE DIAGNOSIS:    Peritonitis Jejunal disruption Gastric leak  PROCEDURE:    LAPAROSCOPY DIAGNOSTIC OMENTOPEXY of jejunal disruption Dale OUT x 21L with drains placement  SURGEON:  Michael Boston, MD   Post-Op 12/01/2015  POST-OPERATIVE DIAGNOSIS:    RECURRENT GASTRIC OUTLET OBSTRUCTION DUE to significant edema at gastroduodenal anastomosis. GASTRITIS Failure to thrive with malnutrition  PROCEDURE:   LAPAROSCOPIC PLACEMENT OF FEEDING JEJUNOSTOMY AND GASTROSTOMY TUBEs ESOPHAGOGASTRODUODENOSCOPY (EGD) BALLOON DILATION OF DUODENAL STRICTURE Gastric biopsies  Closure of gastrotomy  SURGEON:  Michael Boston, MD  Prior surgery 09/25/2015  POST-OPERATIVE DIAGNOSIS:  Partial gastric outlet obstruction due to chronic pyloric stricture  PROCEDURE:   XI ROBOTIC distal gastrectomy BILROTH I  ANASTOMOSIS  ANTERIOR AND POSTERIOR VAGOTOMY DOR (Anterior 992 degree) FUNDIPLICATION OMENTOPEXY UPPER ENDOSCOPY  SURGEON:  Michael Boston,  MD   Problem List:   Principal Problem:   Bile peritonitis due to gastric tube dislodgement Active Problems:   Essential hypertension, benign   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 09/25/2015   Hyperlipidemia   Anxiety state   Gastric outlet obstruction   Gastric Ileus    Encephalopathy acute   Endotracheally intubated   On mechanically assisted ventilation (HCC)   Protein-calorie malnutrition, severe   Acute respiratory failure with hypoxia (HCC)   Tobacco abuse   Ischemic right index finger at tip   Gastritis   Gastric tube present (LUQ)   Jejunostomy tube present (LLQ)    Assessment/Plan:  GUARDED s/p patching at jejunum & resecuring Gtube & massive washout  -CT scan shows concern pneumatosis & probable partial small bowel obstruction at proximal to mid jejunum.  Lactate <2.   No SMA thrombosis.  No evidence of any embolic disease in the mesentery.  Jejunostomy tube in place.  No perforation.  No extravasation of contrast around jejunostomy tube.  Right side clear.  Pelvis clear.   Had BMs & lower NGT/Gtube/Jtube output = doubt complete obstruction.  RLQ & R mid abd surgical drains serous & removed.  2 collections in the abdomen. Loculated ascites versus an abscess. 1st collection lateral to the greater curvature of stomach.  2nd collection lateral to spleen.  We'll ask interventional radiology to percutaneously drain to make sure there is no abscess or other concern.  May consider trying very low-dose elemental tube feeds 62m/hour as there is some data that is protective in pneumatosis intestinalis.  Would like him to be off pressors first.    I would not operate on this patient unless he goes under septic shock with high lactate, severe distention and peritonitis.  Extremely hostile abdomen with high risk of enterocutaneous fistula and breakdown of any small bowel resection.  Any bowel obstruction would  be related to inflammation and adhesions and  an early inflammatory stage.  Would manage conservatively anyway.  Having BMs = not complete SBO.  Continue proximal decompression with orogastric active LIWS suction, gastric tube to gravity (to keep pressure down but not actively suck around Gtube skin leak), jejunostomy tube back to gravity.  I think because of his agitation and sedation issues, he is not going to fly with another extubation event.  Perc trach placed today.  Diuresis as tolerated to help with secretions/vent.    -IV nutrition TNA   -Inflammatory narrowing at gastroduodenal Bilroth I anastomosis.  The fact that there is bilious reflux into the stomach argues against a complete obstruction.  Skin care.  PPI, H2B for gastriris.  F/u Bx's for Hpylori neg.  Carafate via Gtube as tolerated  Diagnosis Stomach, biopsy - CHRONIC FOCALLY ACTIVE GASTRITIS. - THERE IS NO EVIDENCE OF HELICOBACTER PYLORI, DYSPLASIA OR MALIGNANCY.  -Zosyn/antifungal IV x 10 days minimum.    -brachial artery embolus to right index finger resolved.   D/w Dr. Trula Slade with vascular surgery.   Duplex study argues against any more brachial clot with good blood supply.  Can come off heparin   -Sedation/Anxiolysis - challenge with pt that goes from sleeping to pulling at lines/tubes.  mits for now.   Consider transitioning from Xanax and benzos and utilizing haldol/zyprexa/seroquel  Sundowning precautions.  Back on Precidex.  SUNDOWNING PRECAUTIONS   VTE prophylaxis - SCDs  -lowK - replaced  Nicotene patch - apparently dips (pt denied to to me in past but  wife and daughter do confirm that he actually uses it moderately.  Patient's wife hopeful that a lot of this is nicotine withdrawal.  While I am skeptical, does not hurt to use that.  Certainly that can make gastritis/ulcer problems worse.    Disposition: He will definitetly require skilled care facility with rehabilitation capacity upon discharge from the hospital.  I suspect it's going to take at least 2  weeks if not several before he'll be ready for that transition.  His daughter expressed understanding and appreciation   I discussed operative findings, updated the patient's status, discussed probable steps to recovery, and gave postoperative recommendations to the patient and family.   Discussion with critical-care nursing staff.  Recommendations were made.  Questions were answered.  They expressed understanding & appreciation.    [name], M.D., F.A.C.S. Gastrointestinal and Minimally Invasive Surgery [facility] [address] [place], [place] [address] [phone] Main / Paging    12/11/2015   Subjective:  Agitated intermittently.   On Precidex.  Less drainage around gastrostomy tube.   Min vent settings Pressors since pressure drops with the sedatives   No leaking around Jtube ICU nurses outside room Daughter & brother-in-law in room    Objective:  Vital signs:  Vitals:   12/11/15 0430 12/11/15 0500 12/11/15 0530 12/11/15 0600  BP: (!) 172/68 (!) 101/32 (!) 126/39 (!) 143/36  Pulse:      Resp: 17 14 14 14   Temp:      TempSrc:      SpO2: 100% 100% 100% 100%  Weight:      Height:        Last BM Date: 12/10/15  Intake/Output   Yesterday:  10/26 0701 - 10/27 0700 In: 2402.8 [I.V.:1572.8; IV Piggyback:830] Out: 3435 [Urine:2825; Drains:610] This shift:  Total I/O In: 1290.7 [I.V.:840.7; IV Piggyback:450] Out: 890 [Urine:850; Drains:40]  Bowel function:  Flatus: YES  BM:  YES  Drain: Gtube thick dark old blood  Blake drains all serous - no bile tinge    Physical Exam:  General: Pt awake/alert.  Following commands but occ confused.   mild acute distress at best.   Eyes: PERRL, normal EOM.  Sclera clear.  No icterus Neuro: CN II-XII intact w/o focal sensory/motor deficits. Lymph: No head/neck/groin lymphadenopathy Psych:  No delerium/psychosis/paranoia HENT: Normocephalic, Mucus membranes moist.  No  thrush. ETT in place.  NGT in place thin bilious Neck: Supple, No tracheal deviation Chest: CTAB. Chest wall pain w good excursion CV:  Pulses intact.  Regular rhythm MS: Normal AROM mjr joints.  No obvious deformity Abdomen:  G tube LUQ.  Rash around skin with scant drainage, some fullness around the region of the gastrostomy tube but no fluctuance.  J tube in LLQ - skin clean  Softer.  Mildy distended.  Tenderness at G tube site & LLQ.  No pain RLQ/RUQ/epigastric. GU:  Foley in place.  NEMG Ext:  SCDs BLE.  No mjr edema.  Right index finger unchanged - distal pad & nail bed with cyanosis.  Good cap refill to nail bed  Skin: 1+ anasarca.  No other petechiae / purpura  Results:   Diagnosis Stomach, biopsy - CHRONIC FOCALLY ACTIVE GASTRITIS. - THERE IS NO EVIDENCE OF HELICOBACTER PYLORI, DYSPLASIA OR MALIGNANCY. - SEE COMMENT. Microscopic Comment A Warthin-Starry stain is negative for the presence of Helicobacter pylori organisms. (JBK:gt, 12/04/15) Enid Cutter MD Pathologist, Electronic Signature (Case signed 12/04/2015) Specimen Jheremy Boger and Clinical Information Specimen(s) Obtained: Stomach, biopsy Specimen Clinical  Labs: Results for orders placed or performed during the hospital encounter of 11/24/15 (from the past 48 hour(s))  Glucose, capillary     Status: Abnormal   Collection Time: 12/09/15  7:52 AM  Result Value Ref Range   Glucose-Capillary 125 (H) 65 - 99 mg/dL  Glucose, capillary     Status: Abnormal   Collection Time: 12/09/15 11:50 AM  Result Value Ref Range   Glucose-Capillary 126 (H) 65 - 99 mg/dL  Glucose, capillary     Status: Abnormal   Collection Time: 12/09/15  3:56 PM  Result Value Ref Range   Glucose-Capillary 117 (H) 65 - 99 mg/dL  Glucose, capillary     Status: None   Collection Time: 12/09/15  7:47 PM  Result Value Ref Range   Glucose-Capillary 91 65 - 99 mg/dL   Comment 1 Notify RN    Comment 2 Document in Chart   Hemoglobin and hematocrit, blood      Status: Abnormal   Collection Time: 12/09/15  8:48 PM  Result Value Ref Range   Hemoglobin 8.0 (L) 13.0 - 17.0 g/dL   HCT 23.2 (L) 39.0 - 52.0 %  Glucose, capillary     Status: Abnormal   Collection Time: 12/10/15 12:38 AM  Result Value Ref Range   Glucose-Capillary 171 (H) 65 - 99 mg/dL  Glucose, capillary     Status: Abnormal   Collection Time: 12/10/15  3:53 AM  Result Value Ref Range   Glucose-Capillary 174 (H) 65 - 99 mg/dL  Heparin level (unfractionated)     Status: None   Collection Time: 12/10/15  4:18 AM  Result Value Ref Range   Heparin Unfractionated 0.40 0.30 - 0.70 IU/mL    Comment:        IF HEPARIN RESULTS ARE BELOW EXPECTED VALUES, AND PATIENT DOSAGE HAS BEEN CONFIRMED, SUGGEST FOLLOW UP TESTING OF ANTITHROMBIN III LEVELS.   Comprehensive metabolic  panel     Status: Abnormal   Collection Time: 12/10/15  4:18 AM  Result Value Ref Range   Sodium 146 (H) 135 - 145 mmol/L   Potassium 3.1 (L) 3.5 - 5.1 mmol/L   Chloride 118 (H) 101 - 111 mmol/L   CO2 22 22 - 32 mmol/L   Glucose, Bld 180 (H) 65 - 99 mg/dL   BUN 50 (H) 6 - 20 mg/dL   Creatinine, Ser 1.60 (H) 0.61 - 1.24 mg/dL   Calcium 7.4 (L) 8.9 - 10.3 mg/dL   Total Protein 4.5 (L) 6.5 - 8.1 g/dL   Albumin 1.5 (L) 3.5 - 5.0 g/dL   AST 25 15 - 41 U/L   ALT 20 17 - 63 U/L   Alkaline Phosphatase 117 38 - 126 U/L   Total Bilirubin 0.6 0.3 - 1.2 mg/dL   GFR calc non Af Amer 40 (L) >60 mL/min   GFR calc Af Amer 47 (L) >60 mL/min    Comment: (NOTE) The eGFR has been calculated using the CKD EPI equation. This calculation has not been validated in all clinical situations. eGFR's persistently <60 mL/min signify possible Chronic Kidney Disease.    Anion gap 6 5 - 15  Prealbumin     Status: Abnormal   Collection Time: 12/10/15  4:18 AM  Result Value Ref Range   Prealbumin 6.4 (L) 18 - 38 mg/dL    Comment: Performed at [facility]  Magnesium     Status: None   Collection Time: 12/10/15  4:18 AM   Result Value Ref Range   Magnesium 2.2 1.7 - 2.4 mg/dL  Phosphorus     Status: None   Collection Time: 12/10/15  4:18 AM  Result Value Ref Range   Phosphorus 3.4 2.5 - 4.6 mg/dL  Triglycerides     Status: Abnormal   Collection Time: 12/10/15  4:18 AM  Result Value Ref Range   Triglycerides 214 (H) <150 mg/dL    Comment: Performed at [facility]  CBC with Differential/Platelet     Status: Abnormal   Collection Time: 12/10/15  4:18 AM  Result Value Ref Range   WBC 15.0 (H) 4.0 - 10.5 K/uL   RBC 2.61 (L) 4.22 - 5.81 MIL/uL   Hemoglobin 8.0 (L) 13.0 - 17.0 g/dL   HCT 23.2 (L) 39.0 - 52.0 %   MCV 88.9 78.0 - 100.0 fL   MCH 30.7 26.0 - 34.0 pg   MCHC 34.5 30.0 - 36.0 g/dL   RDW 14.6 11.5 - 15.5 %   Platelets 447 (H) 150 - 400 K/uL   Neutrophils Relative % 82 %   Lymphocytes Relative 11 %   Monocytes Relative 6 %   Eosinophils Relative 1 %   Basophils Relative 0 %   Neutro Abs 12.2 (H) 1.7 - 7.7 K/uL   Lymphs Abs 1.7 0.7 - 4.0 K/uL   Monocytes Absolute 0.9 0.1 - 1.0 K/uL   Eosinophils Absolute 0.2 0.0 - 0.7 K/uL   Basophils Absolute 0.0 0.0 - 0.1 K/uL   WBC Morphology TOXIC GRANULATION   Glucose, capillary     Status: Abnormal   Collection Time: 12/10/15  7:55 AM  Result Value Ref Range   Glucose-Capillary 141 (H) 65 - 99 mg/dL  Glucose, capillary     Status: Abnormal   Collection Time: 12/10/15 11:30 AM  Result Value Ref Range   Glucose-Capillary 128 (H) 65 - 99 mg/dL  CBC     Status: Abnormal  Collection Time: 12/10/15  2:51 PM  Result Value Ref Range   WBC 14.4 (H) 4.0 - 10.5 K/uL   RBC 2.55 (L) 4.22 - 5.81 MIL/uL   Hemoglobin 7.7 (L) 13.0 - 17.0 g/dL   HCT 22.6 (L) 39.0 - 52.0 %   MCV 88.6 78.0 - 100.0 fL   MCH 30.2 26.0 - 34.0 pg   MCHC 34.1 30.0 - 36.0 g/dL   RDW 14.6 11.5 - 15.5 %   Platelets 446 (H) 150 - 400 K/uL  Type and screen [place]     Status: None (Preliminary result)   Collection Time: 12/10/15  3:07 PM  Result Value  Ref Range   ABO/RH(D) A POS    Antibody Screen NEG    Sample Expiration 12/13/2015    Unit Number [id number]    Blood Component Type RED CELLS,LR    Unit division 00    Status of Unit ALLOCATED    Transfusion Status OK TO TRANSFUSE    Crossmatch Result Compatible    Unit Number [id number]    Blood Component Type RED CELLS,LR    Unit division 00    Status of Unit ALLOCATED    Transfusion Status OK TO TRANSFUSE    Crossmatch Result Compatible   Prepare RBC     Status: None   Collection Time: 12/10/15  3:07 PM  Result Value Ref Range   Order Confirmation ORDER PROCESSED BY BLOOD BANK   Glucose, capillary     Status: Abnormal   Collection Time: 12/10/15  3:56 PM  Result Value Ref Range   Glucose-Capillary 106 (H) 65 - 99 mg/dL  CBC     Status: Abnormal   Collection Time: 12/10/15  5:30 PM  Result Value Ref Range   WBC 18.5 (H) 4.0 - 10.5 K/uL   RBC 2.74 (L) 4.22 - 5.81 MIL/uL   Hemoglobin 8.3 (L) 13.0 - 17.0 g/dL   HCT 24.2 (L) 39.0 - 52.0 %   MCV 88.3 78.0 - 100.0 fL   MCH 30.3 26.0 - 34.0 pg   MCHC 34.3 30.0 - 36.0 g/dL   RDW 14.6 11.5 - 15.5 %   Platelets 508 (H) 150 - 400 K/uL  Basic metabolic panel     Status: Abnormal   Collection Time: 12/10/15  5:30 PM  Result Value Ref Range   Sodium 143 135 - 145 mmol/L   Potassium 3.0 (L) 3.5 - 5.1 mmol/L   Chloride 114 (H) 101 - 111 mmol/L   CO2 22 22 - 32 mmol/L   Glucose, Bld 141 (H) 65 - 99 mg/dL   BUN 49 (H) 6 - 20 mg/dL   Creatinine, Ser 1.46 (H) 0.61 - 1.24 mg/dL   Calcium 7.6 (L) 8.9 - 10.3 mg/dL   GFR calc non Af Amer 45 (L) >60 mL/min   GFR calc Af Amer 52 (L) >60 mL/min    Comment: (NOTE) The eGFR has been calculated using the CKD EPI equation. This calculation has not been validated in all clinical situations. eGFR's persistently <60 mL/min signify possible Chronic Kidney Disease.    Anion gap 7 5 - 15  Magnesium     Status: None   Collection Time: 12/10/15  5:30 PM  Result Value Ref Range    Magnesium 2.0 1.7 - 2.4 mg/dL  Lactic acid, plasma     Status: None   Collection Time: 12/10/15  7:40 PM  Result Value Ref Range   Lactic Acid, Venous 1.3 0.5 - 1.9  mmol/L  Glucose, capillary     Status: Abnormal   Collection Time: 12/10/15  8:34 PM  Result Value Ref Range   Glucose-Capillary 129 (H) 65 - 99 mg/dL  Glucose, capillary     Status: Abnormal   Collection Time: 12/10/15 11:50 PM  Result Value Ref Range   Glucose-Capillary 144 (H) 65 - 99 mg/dL   Comment 1 Notify RN    Comment 2 Document in Chart   Glucose, capillary     Status: Abnormal   Collection Time: 12/11/15  3:16 AM  Result Value Ref Range   Glucose-Capillary 136 (H) 65 - 99 mg/dL   Comment 1 Notify RN    Comment 2 Document in Chart   CBC     Status: Abnormal   Collection Time: 12/11/15  4:05 AM  Result Value Ref Range   WBC 16.4 (H) 4.0 - 10.5 K/uL   RBC 2.38 (L) 4.22 - 5.81 MIL/uL   Hemoglobin 7.3 (L) 13.0 - 17.0 g/dL   HCT 21.4 (L) 39.0 - 52.0 %   MCV 89.9 78.0 - 100.0 fL   MCH 30.7 26.0 - 34.0 pg   MCHC 34.1 30.0 - 36.0 g/dL   RDW 14.7 11.5 - 15.5 %   Platelets 468 (H) 150 - 400 K/uL  Basic metabolic panel     Status: Abnormal   Collection Time: 12/11/15  4:05 AM  Result Value Ref Range   Sodium 143 135 - 145 mmol/L   Potassium 3.4 (L) 3.5 - 5.1 mmol/L   Chloride 116 (H) 101 - 111 mmol/L   CO2 22 22 - 32 mmol/L   Glucose, Bld 143 (H) 65 - 99 mg/dL   BUN 46 (H) 6 - 20 mg/dL   Creatinine, Ser 1.51 (H) 0.61 - 1.24 mg/dL   Calcium 7.4 (L) 8.9 - 10.3 mg/dL   GFR calc non Af Amer 43 (L) >60 mL/min   GFR calc Af Amer 50 (L) >60 mL/min    Comment: (NOTE) The eGFR has been calculated using the CKD EPI equation. This calculation has not been validated in all clinical situations. eGFR's persistently <60 mL/min signify possible Chronic Kidney Disease.    Anion gap 5 5 - 15  Lactic acid, plasma     Status: None   Collection Time: 12/11/15  4:05 AM  Result Value Ref Range   Lactic Acid, Venous 1.2 0.5  - 1.9 mmol/L  Protime-INR     Status: Abnormal   Collection Time: 12/11/15  4:05 AM  Result Value Ref Range   Prothrombin Time 15.7 (H) 11.4 - 15.2 seconds   INR 1.25     Imaging / Studies: Dg Abd 1 View  Result Date: 12/10/2015 CLINICAL DATA:  Check gastric catheter placement EXAM: ABDOMEN - 1 VIEW COMPARISON:  12/07/2015 FINDINGS: A gastric catheter is now seen in the distal esophagus. The tip does not appear to cross the gastroesophageal junction. A jejunostomy catheter is noted in the left mid abdomen. Surgical drain is noted on right. Scattered large and small bowel gas is noted. No free air is seen. IMPRESSION: Gastric catheter in the distal esophagus. This should be advanced into the stomach. Electronically Signed   By: Inez Catalina M.D.   On: 12/10/2015 08:03   Ct Abdomen Pelvis W Contrast  Result Date: 12/10/2015 CLINICAL DATA:  One week postop from laparoscopy for gastric and jejunal perforation. Previous distal gastrectomy with Billroth 1 for ulcer disease. EXAM: CT ABDOMEN AND PELVIS WITH CONTRAST TECHNIQUE: Multidetector CT  imaging of the abdomen and pelvis was performed using the standard protocol following bolus administration of intravenous contrast. CONTRAST:  41m ISOVUE-300 IOPAMIDOL (ISOVUE-300) INJECTION 61% COMPARISON:  11/25/2015 FINDINGS: Lower Chest: New small pleural effusions and basilar atelectasis, left side greater than right. Hepatobiliary: No mass identified. Prior cholecystectomy noted. No evidence of biliary dilatation. Pancreas:  No mass or inflammatory changes. Spleen: Within normal limits in size and appearance. Adrenals/Urinary Tract: No masses identified. No evidence of hydronephrosis. Foley catheter seen within the urinary bladder which is decompressed. Stomach/Bowel: Nasogastric tube and percutaneous gastrostomy tube are seen in the stomach. Percutaneous jejunostomy tube seen in place as well as several surgical drains. Moderate dilatation of proximal and mid  small bowel loops is seen with transition point in the anterior right lower quadrant on image 59/2, consistent with a partial small bowel obstruction. Distal small bowel loops are nondilated. Pneumatosis is seen involving multiple dilated proximal and mid small bowel loops in the left abdomen and pelvis, suspicious for bowel ischemia. There is no evidence of portal venous gas or free intraperitoneal air. A left upper quadrant fluid collection is seen along the lateral aspect of proximal stomach in the gastrohepatic ligament which measures 6.2 x 9.0 cm. A left subdiaphragmatic/perisplenic fluid collection is also seen which measures 4.2 x 9.6 cm. These may represent postop fluid collections or abscesses. Small amount of free fluid noted in pelvic cul-de-sac. Vascular/Lymphatic: No pathologically enlarged lymph nodes. No abdominal aortic aneurysm. Aortic atherosclerosis. Reproductive:  No mass identified. Other:  Diffuse mesenteric and body wall edema. Musculoskeletal:  No suspicious bone lesions identified. IMPRESSION: Moderate dilatation of proximal and mid small bowel loops, with transition point in anterior right lower quadrant, suspicious for partial small bowel obstruction. Pneumatosis involving multiple dilated small bowel loops in the left abdomen pelvis, suspicious for bowel ischemia. No evidence of portal venous gas or free intraperitoneal air. Two left upper quadrant fluid collections adjacent to the stomach and spleen, which may represent postoperative fluid collections or abscesses. Small amount free fluid also noted in pelvis. Small bilateral pleural effusions and bibasilar atelectasis. Diffuse mesenteric and body wall edema. Electronically Signed   By: JEarle GellM.D.   On: 12/10/2015 15:30   Dg Chest Port 1 View  Result Date: 12/10/2015 CLINICAL DATA:  Readjusted ETT tube EXAM: PORTABLE CHEST 1 VIEW COMPARISON:  12/09/2015 FINDINGS: Endotracheal tube tip is approximately 4.7 cm superior to the  carina. Left-sided central venous catheter tip overlies the SVC. Left CP angle is not included. Hazy left base atelectasis or infiltrate unchanged. Stable cardiomediastinal silhouette. No pneumothorax. IMPRESSION: 1. Support lines and tubes as above 2. Stable hazy left basilar atelectasis or infiltrate Electronically Signed   By: KDonavan FoilM.D.   On: 12/10/2015 00:36    Medications / Allergies: per chart  Antibiotics: Anti-infectives    Start     Dose/Rate Route Frequency Ordered Stop   12/09/15 1200  anidulafungin (ERAXIS) 100 mg in sodium chloride 0.9 % 100 mL IVPB     100 mg over 90 Minutes Intravenous Every 24 hours 12/09/15 0918 12/10/15 1333   12/05/15 1200  anidulafungin (ERAXIS) 100 mg in sodium chloride 0.9 % 100 mL IVPB  Status:  Discontinued     100 mg over 90 Minutes Intravenous Every 24 hours 12/04/15 1045 12/09/15 0835   12/04/15 1200  anidulafungin (ERAXIS) 200 mg in sodium chloride 0.9 % 200 mL IVPB     200 mg over 180 Minutes Intravenous  Once 12/04/15 1045 12/04/15  1725   12/04/15 0600  cefoTEtan (CEFOTAN) 2 g in dextrose 5 % 50 mL IVPB     2 g 100 mL/hr over 30 Minutes Intravenous On call to O.R. 12/03/15 1821 12/03/15 2007   12/04/15 0400  piperacillin-tazobactam (ZOSYN) IVPB 3.375 g     3.375 g 12.5 mL/hr over 240 Minutes Intravenous Every 8 hours 12/03/15 2229     12/02/15 0000  erythromycin ethylsuccinate (EES) 200 MG/5ML suspension 400 mg  Status:  Discontinued    Comments:  Administer via J tube (16Fr LLQ)   400 mg Oral Every 6 hours 12/01/15 2020 12/03/15 0748   11/27/15 1200  erythromycin 500 mg in sodium chloride 0.9 % 100 mL IVPB     500 mg 100 mL/hr over 60 Minutes Intravenous Every 6 hours 11/27/15 0940 11/29/15 0608   11/26/15 0830  erythromycin 250 mg in sodium chloride 0.9 % 100 mL IVPB  Status:  Discontinued     250 mg 100 mL/hr over 60 Minutes Intravenous Every 6 hours 11/26/15 0751 11/27/15 0940        Note: Portions of this report may  have been transcribed using voice recognition software. Every effort was made to ensure accuracy; however, inadvertent computerized transcription errors may be present.   Any transcriptional errors that result from this process are unintentional.    [name], M.D., F.A.C.S. Gastrointestinal and Minimally Invasive Surgery [facility] [address], [place], [place] [address] [phone] Main / Paging     12/11/2015

## 2015-12-11 NOTE — Progress Notes (Signed)
[place] NOTE   Pharmacy Consult for TPN  Patient Measurements: Height: _0  (188 cm) Weight: 203 lb 0.7 oz (92.1 kg) IBW/kg (Calculated) : 82.2 TPN AdjBW (KG): 97.2 Body mass index is 26.07 kg/m.  Insulin Requirements: 5 units Novolog in past 24 hours  Current Nutrition: NPO  IVF: NS at 10 ml/hr  Central access: PICC placed 10/12, pt pulled out 10/19. CVC in place since 10/19 TPN date: 10/12-10/19, resumed 10/25  ASSESSMENT                                                                                                          HPI: 59 yoM admitted 10/9 with new upper abdominal discomfort and bloating with concern for recurrent GOO due to chronic pyloric stricture. 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  Pharmacy consulted to start TPN for gastric ileus and obstruction 10/12. Patient underwent G and J tube placement this admission. On 10/19, patient pulled out PICC line. He also had to go back to OR after he removed his G tube accidentally, leading to peritonitis, intubation, and septic shock. Tube feed rate slowly advanced by CCS, but today patient noted to have dark brown output around J tube site, and tube feeds held. Pharmacy asked to resume TPN.   Today, 12/11/15:   Glucose - No hx DM noted. CBGs now at goal <150.  Electrolytes - K+ slightly low at 3.4 (Kruns x4 ordered by MD today), Cl- high at 116, all other lytes, including Corrected Ca WNL  Renal - AKI, I/O: 2583/4370  LFTs - AST/ALT, Alk Phos, Tbili WNL  TGs -76 (10/13), 97 (10/16), 214 (10/26)  Prealbumin - 17.8 (10/11), 12.8 (10/13), 11 (10/16), 12.5 (10/18), < 5 (10/23), 6.4 (10/26)  NUTRITIONAL GOALS                                                                                             RD recs (as of 10/25): NTIR:4431 Protein:111-139 grams (1.2-1.5 grams/kg)  Clinimix 5/15 at a goal rate of  50m/hr +108g/day protein, 2013Kcal/day.  **There is currently a nPsychologist, prison and probation servicesof Clinimix solution. No 2L bags of Clinimix E 5/20 currently available. To conserve supply, will consider goal rate of Clinimix 5/15 at 83 ml/hr + 20% fat emulsion at 156mhr, which will keep total volume at 2L rather than increasing use to 3L.   PLAN  At 1800 today:  Increase Clinimix E 5/15 to 60 ml/hr.   20% fat emulsion at 10 ml/hr.  TPN to contain standard multivitamins and trace elements.  Plan to advance as tolerated to the goal rate.   IVF per MD.  Continue sensitive SSI q4h.   TPN lab panels in AM and on Mondays & Thursdays.  Check Bmet in am.   F/u daily.  Netta Cedars, PharmD, BCPS Pager: [phone] 12/11/2015_0 :39 AM

## 2015-12-12 LAB — BASIC METABOLIC PANEL
ANION GAP: 7 (ref 5–15)
BUN: 42 mg/dL — AB (ref 6–20)
CHLORIDE: 114 mmol/L — AB (ref 101–111)
CO2: 24 mmol/L (ref 22–32)
Calcium: 7.8 mg/dL — ABNORMAL LOW (ref 8.9–10.3)
Creatinine, Ser: 1.45 mg/dL — ABNORMAL HIGH (ref 0.61–1.24)
GFR, EST AFRICAN AMERICAN: 52 mL/min — AB (ref >60)
GFR, EST NON AFRICAN AMERICAN: 45 mL/min — AB (ref >60)
Glucose, Bld: 126 mg/dL — ABNORMAL HIGH (ref 65–99)
POTASSIUM: 3.4 mmol/L — AB (ref 3.5–5.1)
SODIUM: 145 mmol/L (ref 135–145)

## 2015-12-12 LAB — CBC
HEMATOCRIT: 18.5 % — AB (ref 39.0–52.0)
HEMOGLOBIN: 6.3 g/dL — AB (ref 13.0–17.0)
MCH: 30.9 pg (ref 26.0–34.0)
MCHC: 34.1 g/dL (ref 30.0–36.0)
MCV: 90.7 fL (ref 78.0–100.0)
Platelets: 519 10*3/uL — ABNORMAL HIGH (ref 150–400)
RBC: 2.04 MIL/uL — AB (ref 4.22–5.81)
RDW: 14.5 % (ref 11.5–15.5)
WBC: 11.7 10*3/uL — AB (ref 4.0–10.5)

## 2015-12-12 LAB — GLUCOSE, CAPILLARY
GLUCOSE-CAPILLARY: 132 mg/dL — AB (ref 65–99)
GLUCOSE-CAPILLARY: 140 mg/dL — AB (ref 65–99)
GLUCOSE-CAPILLARY: 140 mg/dL — AB (ref 65–99)
GLUCOSE-CAPILLARY: 153 mg/dL — AB (ref 65–99)
Glucose-Capillary: 159 mg/dL — ABNORMAL HIGH (ref 65–99)
Glucose-Capillary: 153 mg/dL — ABNORMAL HIGH (ref 65–99)

## 2015-12-12 MED ORDER — HYDRALAZINE HCL 20 MG/ML IJ SOLN
5.0000 mg | INTRAMUSCULAR | Status: DC | PRN
  Administered 2015-12-12 – 2015-12-20 (×6): 5 mg via INTRAVENOUS
  Filled 2015-12-12 (×6): qty 1

## 2015-12-12 MED ORDER — LIP MEDEX EX OINT
TOPICAL_OINTMENT | CUTANEOUS | Status: DC | PRN
  Administered 2015-12-21: 01:00:00 via TOPICAL
  Filled 2015-12-12: qty 7

## 2015-12-12 MED ORDER — TRACE MINERALS CR-CU-MN-SE-ZN 10-1000-500-60 MCG/ML IV SOLN
83.0000 mL/h | INTRAVENOUS | Status: AC
  Administered 2015-12-12: 18:00:00 via INTRAVENOUS
  Filled 2015-12-12 (×2): qty 1992

## 2015-12-12 MED ORDER — FAT EMULSION 20 % IV EMUL
240.0000 mL | INTRAVENOUS | Status: AC
  Administered 2015-12-12: 240 mL via INTRAVENOUS
  Filled 2015-12-12 (×2): qty 250

## 2015-12-12 MED ORDER — FAT EMULSION 20 % IV EMUL
240.0000 mL | INTRAVENOUS | Status: DC
  Filled 2015-12-12: qty 250

## 2015-12-12 MED ORDER — SODIUM CHLORIDE 0.9 % IV SOLN
Freq: Once | INTRAVENOUS | Status: AC
  Administered 2015-12-12: 22:00:00 via INTRAVENOUS

## 2015-12-12 MED ORDER — M.V.I. ADULT IV INJ
83.0000 mL/h | INJECTION | INTRAVENOUS | Status: DC
  Filled 2015-12-12 (×4): qty 1992

## 2015-12-12 MED ORDER — POTASSIUM CHLORIDE 10 MEQ/100ML IV SOLN
10.0000 meq | INTRAVENOUS | Status: AC
  Administered 2015-12-12 (×4): 10 meq via INTRAVENOUS
  Filled 2015-12-12 (×4): qty 100

## 2015-12-12 NOTE — Progress Notes (Signed)
Pt had a stool occurrence at 1700 that had some blood present upon assessment; pt also having blood oozing from his J-tube site, which is different from baseline.  MD made aware.  Orders placed.   Will continue to monitor.

## 2015-12-12 NOTE — Progress Notes (Signed)
Referring Physician(s): Gross,S  Supervising Physician: [name]  Patient Status:  [facility] - In-pt  Chief Complaint:  Abdominal fluid collections  Subjective: Pt with less abd pain/pressure today ; no acute changes   Allergies: Flomax [tamsulosin hcl]; Lisinopril; Lorazepam; and Uroxatral [alfuzosin hcl er]  Medications: Prior to Admission medications   Medication Sig Start Date End Date Taking? Authorizing Provider  ALPRAZolam Prudy Feeler) 0.5 MG tablet Take 0.5 mg by mouth at bedtime as needed for sleep or anxiety. 10/02/15  Yes Historical Provider, MD  aspirin EC 81 MG tablet Take 81 mg by mouth every evening.    Yes Historical Provider, MD  atorvastatin (LIPITOR) 40 MG tablet Take 40 mg by mouth at bedtime.    Yes Historical Provider, MD  calcium-vitamin D (OSCAL WITH D) 500-200 MG-UNIT tablet Take 1 tablet by mouth daily with breakfast.   Yes Historical Provider, MD  Cyanocobalamin (VITAMIN B-12) 1000 MCG SUBL Place 1 tablet under the tongue daily with breakfast.   Yes Historical Provider, MD  Docusate Calcium (STOOL SOFTENER PO) Take 1 capsule by mouth daily as needed (for constipation).    Yes Historical Provider, MD  Multiple Vitamin (MULTIVITAMIN) tablet Take 2 tablets by mouth daily with breakfast.    Yes Historical Provider, MD  nitroGLYCERIN (NITROSTAT) 0.4 MG SL tablet Place 0.4 mg under the tongue every 5 (five) minutes as needed for chest pain.   Yes Historical Provider, MD  pantoprazole (PROTONIX) 40 MG tablet Take 40 mg by mouth 2 (two) times daily. 06/02/15  Yes Historical Provider, MD  Polyvinyl Alcohol-Povidone (REFRESH OP) Place 2 drops into both eyes as needed (For dry eyes.).    Yes Historical Provider, MD  RESTASIS 0.05 % ophthalmic emulsion Place 1 drop into both eyes 2 (two) times daily.  04/16/13  Yes Historical Provider, MD  valsartan (DIOVAN) 80 MG tablet Take 80 mg by mouth daily with breakfast.    Yes Historical Provider, MD  metoCLOPramide (REGLAN) 5 MG  tablet Take 1-2 tablets (5-10 mg total) by mouth every 6 (six) hours as needed for nausea or vomiting. Patient not taking: Reported on 11/24/2015 10/01/15   Karie Soda, MD  traMADol (ULTRAM) 50 MG tablet Take 1-2 tablets (50-100 mg total) by mouth every 6 (six) hours as needed for moderate pain or severe pain. Patient not taking: Reported on 11/24/2015 10/01/15   Karie Soda, MD     Vital Signs: BP (!) 176/40   Pulse (!) 108   Temp 99.4 F (37.4 C) (Axillary)   Resp (!) 29   Ht 6\' 2"  (1.88 m)   Wt 195 lb 8.8 oz (88.7 kg)   SpO2 100%   BMI 25.11 kg/m   Physical Exam IR placed left abd drains intact, dressings dry, outputs 115-185 cc serous fluid, cx's pend  Imaging: Dg Abd 1 View  Result Date: 12/10/2015 CLINICAL DATA:  Check gastric catheter placement EXAM: ABDOMEN - 1 VIEW COMPARISON:  12/07/2015 FINDINGS: A gastric catheter is now seen in the distal esophagus. The tip does not appear to cross the gastroesophageal junction. A jejunostomy catheter is noted in the left mid abdomen. Surgical drain is noted on right. Scattered large and small bowel gas is noted. No free air is seen. IMPRESSION: Gastric catheter in the distal esophagus. This should be advanced into the stomach. Electronically Signed   By: Alcide Clever M.D.   On: 12/10/2015 08:03   Ct Abdomen Pelvis W Contrast  Result Date: 12/10/2015 CLINICAL DATA:  One week  postop from laparoscopy for gastric and jejunal perforation. Previous distal gastrectomy with Billroth 1 for ulcer disease. EXAM: CT ABDOMEN AND PELVIS WITH CONTRAST TECHNIQUE: Multidetector CT imaging of the abdomen and pelvis was performed using the standard protocol following bolus administration of intravenous contrast. CONTRAST:  80mL ISOVUE-300 IOPAMIDOL (ISOVUE-300) INJECTION 61% COMPARISON:  11/25/2015 FINDINGS: Lower Chest: New small pleural effusions and basilar atelectasis, left side greater than right. Hepatobiliary: No mass identified. Prior  cholecystectomy noted. No evidence of biliary dilatation. Pancreas:  No mass or inflammatory changes. Spleen: Within normal limits in size and appearance. Adrenals/Urinary Tract: No masses identified. No evidence of hydronephrosis. Foley catheter seen within the urinary bladder which is decompressed. Stomach/Bowel: Nasogastric tube and percutaneous gastrostomy tube are seen in the stomach. Percutaneous jejunostomy tube seen in place as well as several surgical drains. Moderate dilatation of proximal and mid small bowel loops is seen with transition point in the anterior right lower quadrant on image 59/2, consistent with a partial small bowel obstruction. Distal small bowel loops are nondilated. Pneumatosis is seen involving multiple dilated proximal and mid small bowel loops in the left abdomen and pelvis, suspicious for bowel ischemia. There is no evidence of portal venous gas or free intraperitoneal air. A left upper quadrant fluid collection is seen along the lateral aspect of proximal stomach in the gastrohepatic ligament which measures 6.2 x 9.0 cm. A left subdiaphragmatic/perisplenic fluid collection is also seen which measures 4.2 x 9.6 cm. These may represent postop fluid collections or abscesses. Small amount of free fluid noted in pelvic cul-de-sac. Vascular/Lymphatic: No pathologically enlarged lymph nodes. No abdominal aortic aneurysm. Aortic atherosclerosis. Reproductive:  No mass identified. Other:  Diffuse mesenteric and body wall edema. Musculoskeletal:  No suspicious bone lesions identified. IMPRESSION: Moderate dilatation of proximal and mid small bowel loops, with transition point in anterior right lower quadrant, suspicious for partial small bowel obstruction. Pneumatosis involving multiple dilated small bowel loops in the left abdomen pelvis, suspicious for bowel ischemia. No evidence of portal venous gas or free intraperitoneal air. Two left upper quadrant fluid collections adjacent to the  stomach and spleen, which may represent postoperative fluid collections or abscesses. Small amount free fluid also noted in pelvis. Small bilateral pleural effusions and bibasilar atelectasis. Diffuse mesenteric and body wall edema. Electronically Signed   By: Myles Rosenthal M.D.   On: 12/10/2015 15:30   Dg Chest Port 1 View  Result Date: 12/11/2015 CLINICAL DATA:  Tracheostomy tube EXAM: PORTABLE CHEST 1 VIEW COMPARISON:  400 hour FINDINGS: The endotracheal tube has been exchanged for a Shiley tracheostomy tube. Tip is 6.7 cm from the carina. Lungs are hyperaerated. Hazy opacity at the left base has improved. Normal heart size. No pneumothorax. Left subclavian central venous catheter and left PICC are stable. IMPRESSION: Tracheostomy tube as described. Improved hazy airspace disease at the left base. Electronically Signed   By: [name] M.D.   On: 12/11/2015 09:37   Dg Chest Port 1 View  Result Date: 12/11/2015 CLINICAL DATA:  Respiratory failure, endotracheal tube position. EXAM: PORTABLE CHEST 1 VIEW COMPARISON:  12/09/2015 FINDINGS: Endotracheal tube unchanged with tip 5.2 cm above the carina. Left subclavian central venous catheter unchanged. Interval placement of nasogastric tube with side port just above the expected region of the gastroesophageal junction as tube courses into the region of the stomach and off the inferior portion of the film as tip is not visualized but likely over the stomach. Interval placement of left-sided PICC line with tip over  the SVC. Lungs are adequately inflated with stable subtle hazy density over the left base likely atelectasis. Cardiomediastinal silhouette and remainder of the exam is unchanged. IMPRESSION: Persistent subtle hazy density over the left base likely atelectasis. Tubes and lines as described. Electronically Signed   By: Elberta Fortisaniel  Boyle M.D.   On: 12/11/2015 07:35   Dg Chest Port 1 View  Result Date: 12/10/2015 CLINICAL DATA:  Readjusted ETT tube EXAM:  PORTABLE CHEST 1 VIEW COMPARISON:  12/09/2015 FINDINGS: Endotracheal tube tip is approximately 4.7 cm superior to the carina. Left-sided central venous catheter tip overlies the SVC. Left CP angle is not included. Hazy left base atelectasis or infiltrate unchanged. Stable cardiomediastinal silhouette. No pneumothorax. IMPRESSION: 1. Support lines and tubes as above 2. Stable hazy left basilar atelectasis or infiltrate Electronically Signed   By: Jasmine PangKim  Fujinaga M.D.   On: 12/10/2015 00:36   Dg Chest Port 1 View  Result Date: 12/09/2015 CLINICAL DATA:  Respiratory failure. EXAM: PORTABLE CHEST 1 VIEW COMPARISON:  12/08/2015. FINDINGS: Endotracheal tube and left subclavian line stable position. Heart size stable. Persistent atelectatic changes left lung base. Associated left lower lobe infiltrate cannot be excluded. No prominent pleural effusion or pneumothorax. IMPRESSION: 1. Lines and tubes in stable position. 2. Persistent atelectatic changes left lung base. Mild left base infiltrate cannot be excluded. No change from prior exam . Electronically Signed   By: Maisie Fushomas  Register   On: 12/09/2015 07:04   Ct Image Guided Drainage By Percutaneous Catheter  Result Date: 12/11/2015 CLINICAL DATA:  Peritonitis and separate fluid collections adjacent to the greater curvature the stomach and lateral to the spleen in the left upper quadrant. The patient presents for percutaneous drainage of these collections. EXAM: CT GUIDED CATHETER DRAINAGE OF PERITONEAL ABSCESS X 2 ANESTHESIA/SEDATION: 2.0 mg IV Versed 50 mcg IV Fentanyl Total Moderate Sedation Time:  36 minutes The patient's level of consciousness and physiologic status were continuously monitored during the procedure by Radiology nursing. PROCEDURE: The procedure, risks, benefits, and alternatives were explained to the patient's wife. Questions regarding the procedure were encouraged and answered. The patient's wife understands and consents to the procedure. A time  out was performed prior to initiating the procedure. The left abdominal wall was prepped with chlorhexidine in a sterile fashion, and a sterile drape was applied covering the operative field. A sterile gown and sterile gloves were used for the procedure. Local anesthesia was provided with 1% Lidocaine. CT was performed through the upper abdomen in a supine position. After localizing sites for percutaneous drain placement, 2 separate 18 gauge trocar needles were advanced under CT guidance into separate collections. Fluid aspiration was performed and a sample sent for culture analysis. Guidewires were advanced and trocar needles removed. The 2 separate tracts were dilated to 10 [place] and over guidewires, separate 10 French percutaneous drainage catheters advance. Catheter positioning was confirmed by CT. The catheters were connected to suction bulbs. The catheters were secured at the skin with Prolene retention sutures and StatLock devices. COMPLICATIONS: None FINDINGS: Both collections yielded clear fluid. Fluid adjacent to the greater curvature of the stomach was slightly blood tinged. Fluid adjacent to the spleen was yellowish in color. Separate 10 French drains were placed in both collections and are draining well after placement. IMPRESSION: CT-guided percutaneous drainage of separate left upper quadrant peritoneal collections along the greater curvature of the stomach and lateral to the spleen. 10 French drains were placed and attached to suction bulb drainage. Given similar nature of fluid from both  collections, samples were combined and sent for a single abscess culture analysis. Electronically Signed   By: Irish LackGlenn  Yamagata M.D.   On: 12/11/2015 13:44   Ct Image Guided Drainage By Percutaneous Catheter  Result Date: 12/11/2015 CLINICAL DATA:  Peritonitis and separate fluid collections adjacent to the greater curvature the stomach and lateral to the spleen in the left upper quadrant. The patient presents  for percutaneous drainage of these collections. EXAM: CT GUIDED CATHETER DRAINAGE OF PERITONEAL ABSCESS X 2 ANESTHESIA/SEDATION: 2.0 mg IV Versed 50 mcg IV Fentanyl Total Moderate Sedation Time:  36 minutes The patient's level of consciousness and physiologic status were continuously monitored during the procedure by Radiology nursing. PROCEDURE: The procedure, risks, benefits, and alternatives were explained to the patient's wife. Questions regarding the procedure were encouraged and answered. The patient's wife understands and consents to the procedure. A time out was performed prior to initiating the procedure. The left abdominal wall was prepped with chlorhexidine in a sterile fashion, and a sterile drape was applied covering the operative field. A sterile gown and sterile gloves were used for the procedure. Local anesthesia was provided with 1% Lidocaine. CT was performed through the upper abdomen in a supine position. After localizing sites for percutaneous drain placement, 2 separate 18 gauge trocar needles were advanced under CT guidance into separate collections. Fluid aspiration was performed and a sample sent for culture analysis. Guidewires were advanced and trocar needles removed. The 2 separate tracts were dilated to 10 [place] and over guidewires, separate 10 French percutaneous drainage catheters advance. Catheter positioning was confirmed by CT. The catheters were connected to suction bulbs. The catheters were secured at the skin with Prolene retention sutures and StatLock devices. COMPLICATIONS: None FINDINGS: Both collections yielded clear fluid. Fluid adjacent to the greater curvature of the stomach was slightly blood tinged. Fluid adjacent to the spleen was yellowish in color. Separate 10 French drains were placed in both collections and are draining well after placement. IMPRESSION: CT-guided percutaneous drainage of separate left upper quadrant peritoneal collections along the greater curvature  of the stomach and lateral to the spleen. 10 French drains were placed and attached to suction bulb drainage. Given similar nature of fluid from both collections, samples were combined and sent for a single abscess culture analysis. Electronically Signed   By: Irish LackGlenn  Yamagata M.D.   On: 12/11/2015 13:44    Labs:  CBC:  Recent Labs  12/10/15 0418 12/10/15 1451 12/10/15 1730 12/11/15 0405  WBC 15.0* 14.4* 18.5* 16.4*  HGB 8.0* 7.7* 8.3* 7.3*  HCT 23.2* 22.6* 24.2* 21.4*  PLT 447* 446* 508* 468*    COAGS:  Recent Labs  12/04/15 2020 12/11/15 0405  INR 1.62 1.25  APTT 39*  --     BMP:  Recent Labs  12/10/15 0418 12/10/15 1730 12/11/15 0405 12/12/15 0500  NA 146* 143 143 145  K 3.1* 3.0* 3.4* 3.4*  CL 118* 114* 116* 114*  CO2 22 22 22 24   GLUCOSE 180* 141* 143* 126*  BUN 50* 49* 46* 42*  CALCIUM 7.4* 7.6* 7.4* 7.8*  CREATININE 1.60* 1.46* 1.51* 1.45*  GFRNONAA 40* 45* 43* 45*  GFRAA 47* 52* 50* 52*    LIVER FUNCTION TESTS:  Recent Labs  12/03/15 0512 12/04/15 0930 12/09/15 0409 12/10/15 0418  BILITOT 0.6 0.7 1.1 0.6  AST 27 35 24 25  ALT 62 30 21 20   ALKPHOS 78 51 120 117  PROT 5.7* 4.0* 4.3* 4.5*  ALBUMIN 2.6* 1.8* 1.4* 1.5*  Assessment and Plan:  Peritonitis with left abdominal fluid collections, s/p drainage (x2)10/27; temp 99.4; check final fluid cx's; creat 1.45; cont current tx; f/u imaging next week to assess adequacy of drainage   Electronically Signed: D. Jeananne Rama 12/12/2015, 9:58 AM   I spent a total of 15 minutes at the the patient's bedside AND on the patient's hospital floor or unit, greater than 50% of which was counseling/coordinating care for abdominal fluid collection drainage x2    Patient ID: [name], male   DOB: 24-Oct-1940, 75 y.o.   MRN: [id number]

## 2015-12-12 NOTE — Progress Notes (Signed)
eLink Physician-Brief Progress Note Patient Name: Danny GalRalph T Kamrowski DOB: Apr 11, 1940 MRN: [id number]   Date of Service  12/12/2015  HPI/Events of Note  Continued bleeding from J tube site.  Hgb 6.3.  eICU Interventions  Notify surgeon Transfuse 2 units prbc's     Intervention Category Intermediate Interventions: Bleeding - evaluation and treatment with blood products  Henry RusselSMITH, Glenyce Randle, P 12/12/2015, 8:35 PM

## 2015-12-12 NOTE — Progress Notes (Signed)
[place] NOTE   Pharmacy Consult for TPN  Patient Measurements: Height: 6' 2"  (188 cm) Weight: 195 lb 8.8 oz (88.7 kg) IBW/kg (Calculated) : 82.2 TPN AdjBW (KG): 97.2 Body mass index is 25.11 kg/m.  Insulin Requirements: 5 units Novolog in past 24 hours  Current Nutrition: NPO  IVF: NS at 10 ml/hr  Central access: PICC placed 10/12, pt pulled out 10/19. CVC in place since 10/19 TPN date: 10/12-10/19, resumed 10/25  ASSESSMENT                                                                                                          HPI: 64 yoM admitted 10/9 with new upper abdominal discomfort and bloating with concern for recurrent GOO due to chronic pyloric stricture. 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  Pharmacy consulted to start TPN for gastric ileus and obstruction 10/12. Patient underwent G and J tube placement this admission. On 10/19, patient pulled out PICC line. He also had to go back to OR after he removed his G tube accidentally, leading to peritonitis, intubation, and septic shock. Tube feed rate slowly advanced by CCS, but today patient noted to have dark brown output around J tube site, and tube feeds held. Pharmacy asked to resume TPN.   Today, 12/12/15:   Glucose - No hx DM noted. CBGs now mostly at goal <150 (range 129-159).  Electrolytes - K+ slightly low at 3.4, Cl- high at 114 (stable), all other lytes, including Corrected Ca WNL  Renal - AKI, I/O: 2583/4370  LFTs - AST/ALT, Alk Phos, Tbili WNL  TGs -76 (10/13), 97 (10/16), 214 (10/26)  Prealbumin - 17.8 (10/11), 12.8 (10/13), 11 (10/16), 12.5 (10/18), < 5 (10/23), 6.4 (10/26)  Noted RD's recommendation for Vital 1.2 @ 53m/hr when medically feasible  NUTRITIONAL GOALS                                                                                             RD recs (as of  10/25): Kcal:2099 Protein:111-138 grams (1.2-1.5 grams/kg)  Clinimix 5/15 at a goal rate of 931mhr would provide 108g/day protein, 2013Kcal/day.  **There is currently a naPsychologist, prison and probation servicesf Clinimix solution. No 2L bags of Clinimix E 5/20 currently available. To conserve supply, will consider goal rate of Clinimix 5/15 at 83 ml/hr + 20% fat emulsion at 1034mr, which will keep total volume at 2L rather than increasing use to 3L.   PLAN  Now:  K+runs 10 mEq IV x4 runs today   At 1800 today:  Increase Clinimix E 5/15 to 83 ml/hr (max rate).   20% fat emulsion at 10 ml/hr.  TPN to contain standard multivitamins and trace elements.  IVF per MD.  Continue sensitive SSI q4h.   TPN lab panels in AM and on Mondays & Thursdays.  Check Bmet in am.   F/u daily.  Start TF per RD recommendation as soon as medically feasible (hopefully over weekend per MD note).   Netta Cedars, PharmD, BCPS Pager: [phone] 12/12/2015@8 :36 AM

## 2015-12-12 NOTE — Progress Notes (Signed)
CRITICAL VALUE ALERT  Critical value received:  Hemoglobin 6.3  Date of notification:  12/12/2015  Time of notification:  2030  Critical value read back:Yes.    Nurse who received alert:  Charlynn CourtBen Rosalynn Sergent RN  MD notified (1st page):  Katrinka BlazingSmith MD  Time of first page:  2035  Responding MD:  Katrinka BlazingSmith MD  Time MD responded:  2040

## 2015-12-12 NOTE — Progress Notes (Signed)
Rosenbower MD notified of bleeding around J-tube site and Hemoglobin drop from 7.3 to 6.3, notified of 2U PRBCs ordered by Katrinka BlazingSmith MD w/ CCM.  2030 - Rosenbower at bedside assess J-tube site and redressed site, stated to apply ice pack to site. Will continue to monitor.

## 2015-12-12 NOTE — Progress Notes (Signed)
Called by RN stating patient had some oozing and clot around J-tube site and a drop in hemoglobin.  2 units of PRBCs are ordered.  On exam, there is some clot around j-tube site but no active bleeding at this time.  Will continue to monitor.

## 2015-12-12 NOTE — Progress Notes (Signed)
General Surgery [facility]  12/12/2015  Assessment & Plan: POD#8 s/p patching at jejunum & resecuring Gtube & massive washout  Perc trach placed - weaning, perhaps trach mask trial today - per CCM  IR place 2 perc drains LUQ yesterday  Gtube & Jtube on drainage  TNA  Zosyn        Velora Heckler, MD, [facility]       Office: [phone]    Subjective: Patient awakens to voice, wants water to drink.  Family at bedside.  Objective: Vital signs in last 24 hours: Temp:  [98.3 F (36.8 C)-99.4 F (37.4 C)] 99.4 F (37.4 C) (10/28 0400) Pulse Rate:  [98-109] 108 (10/28 0729) Resp:  [15-32] 29 (10/28 0900) BP: (108-204)/(28-73) 176/40 (10/28 0815) SpO2:  [99 %-100 %] 100 % (10/28 0900) FiO2 (%):  [28 %-100 %] 30 % (10/28 0729) Weight:  [88.7 kg (195 lb 8.8 oz)] 88.7 kg (195 lb 8.8 oz) (10/28 0500) Last BM Date: 12/10/15  Intake/Output from previous day: 10/27 0701 - 10/28 0700 In: 2471.5 [I.V.:1721.5; IV Piggyback:750] Out: 3125 [Urine:2100; Emesis/NG output:600; Drains:425] Intake/Output this shift: Total I/O In: 400 [I.V.:380; Other:20] Out: -   Physical Exam: HEENT - sclerae clear, mucous membranes moist Neck - soft, trach site clear and dry Chest - clear bilaterally Cor - RRR Abdomen - soft, perc drains LUQ with serous; Gtube & Jtube on drainage; drain RUQ with cloudy/particulate output  Lab Results:   Recent Labs  12/10/15 1730 12/11/15 0405  WBC 18.5* 16.4*  HGB 8.3* 7.3*  HCT 24.2* 21.4*  PLT 508* 468*   BMET  Recent Labs  12/11/15 0405 12/12/15 0500  NA 143 145  K 3.4* 3.4*  CL 116* 114*  CO2 22 24  GLUCOSE 143* 126*  BUN 46* 42*  CREATININE 1.51* 1.45*  CALCIUM 7.4* 7.8*   PT/INR  Recent Labs  12/11/15 0405  LABPROT 15.7*  INR 1.25   Comprehensive Metabolic Panel:    Component Value Date/Time   NA 145 12/12/2015 0500   NA 143 12/11/2015 0405   K 3.4 (L) 12/12/2015 0500    K 3.4 (L) 12/11/2015 0405   CL 114 (H) 12/12/2015 0500   CL 116 (H) 12/11/2015 0405   CO2 24 12/12/2015 0500   CO2 22 12/11/2015 0405   BUN 42 (H) 12/12/2015 0500   BUN 46 (H) 12/11/2015 0405   CREATININE 1.45 (H) 12/12/2015 0500   CREATININE 1.51 (H) 12/11/2015 0405   GLUCOSE 126 (H) 12/12/2015 0500   GLUCOSE 143 (H) 12/11/2015 0405   CALCIUM 7.8 (L) 12/12/2015 0500   CALCIUM 7.4 (L) 12/11/2015 0405   AST 25 12/10/2015 0418   AST 24 12/09/2015 0409   ALT 20 12/10/2015 0418   ALT 21 12/09/2015 0409   ALKPHOS 117 12/10/2015 0418   ALKPHOS 120 12/09/2015 0409   BILITOT 0.6 12/10/2015 0418   BILITOT 1.1 12/09/2015 0409   PROT 4.5 (L) 12/10/2015 0418   PROT 4.3 (L) 12/09/2015 0409   ALBUMIN 1.5 (L) 12/10/2015 0418   ALBUMIN 1.4 (L) 12/09/2015 0409    Studies/Results: Ct Abdomen Pelvis W Contrast  Result Date: 12/10/2015 CLINICAL DATA:  One week postop from laparoscopy for gastric and jejunal perforation. Previous distal gastrectomy with Billroth 1 for ulcer disease. EXAM: CT ABDOMEN AND PELVIS WITH CONTRAST TECHNIQUE: Multidetector CT imaging of the abdomen and pelvis was performed using the standard protocol following bolus administration  of intravenous contrast. CONTRAST:  80mL ISOVUE-300 IOPAMIDOL (ISOVUE-300) INJECTION 61% COMPARISON:  11/25/2015 FINDINGS: Lower Chest: New small pleural effusions and basilar atelectasis, left side greater than right. Hepatobiliary: No mass identified. Prior cholecystectomy noted. No evidence of biliary dilatation. Pancreas:  No mass or inflammatory changes. Spleen: Within normal limits in size and appearance. Adrenals/Urinary Tract: No masses identified. No evidence of hydronephrosis. Foley catheter seen within the urinary bladder which is decompressed. Stomach/Bowel: Nasogastric tube and percutaneous gastrostomy tube are seen in the stomach. Percutaneous jejunostomy tube seen in place as well as several surgical drains. Moderate dilatation of proximal  and mid small bowel loops is seen with transition point in the anterior right lower quadrant on image 59/2, consistent with a partial small bowel obstruction. Distal small bowel loops are nondilated. Pneumatosis is seen involving multiple dilated proximal and mid small bowel loops in the left abdomen and pelvis, suspicious for bowel ischemia. There is no evidence of portal venous gas or free intraperitoneal air. A left upper quadrant fluid collection is seen along the lateral aspect of proximal stomach in the gastrohepatic ligament which measures 6.2 x 9.0 cm. A left subdiaphragmatic/perisplenic fluid collection is also seen which measures 4.2 x 9.6 cm. These may represent postop fluid collections or abscesses. Small amount of free fluid noted in pelvic cul-de-sac. Vascular/Lymphatic: No pathologically enlarged lymph nodes. No abdominal aortic aneurysm. Aortic atherosclerosis. Reproductive:  No mass identified. Other:  Diffuse mesenteric and body wall edema. Musculoskeletal:  No suspicious bone lesions identified. IMPRESSION: Moderate dilatation of proximal and mid small bowel loops, with transition point in anterior right lower quadrant, suspicious for partial small bowel obstruction. Pneumatosis involving multiple dilated small bowel loops in the left abdomen pelvis, suspicious for bowel ischemia. No evidence of portal venous gas or free intraperitoneal air. Two left upper quadrant fluid collections adjacent to the stomach and spleen, which may represent postoperative fluid collections or abscesses. Small amount free fluid also noted in pelvis. Small bilateral pleural effusions and bibasilar atelectasis. Diffuse mesenteric and body wall edema. Electronically Signed   By: Myles RosenthalJohn  Stahl M.D.   On: 12/10/2015 15:30   Dg Chest Port 1 View  Result Date: 12/11/2015 CLINICAL DATA:  Tracheostomy tube EXAM: PORTABLE CHEST 1 VIEW COMPARISON:  400 hour FINDINGS: The endotracheal tube has been exchanged for a Shiley  tracheostomy tube. Tip is 6.7 cm from the carina. Lungs are hyperaerated. Hazy opacity at the left base has improved. Normal heart size. No pneumothorax. Left subclavian central venous catheter and left PICC are stable. IMPRESSION: Tracheostomy tube as described. Improved hazy airspace disease at the left base. Electronically Signed   By: Jolaine ClickArthur  Hoss M.D.   On: 12/11/2015 09:37   Dg Chest Port 1 View  Result Date: 12/11/2015 CLINICAL DATA:  Respiratory failure, endotracheal tube position. EXAM: PORTABLE CHEST 1 VIEW COMPARISON:  12/09/2015 FINDINGS: Endotracheal tube unchanged with tip 5.2 cm above the carina. Left subclavian central venous catheter unchanged. Interval placement of nasogastric tube with side port just above the expected region of the gastroesophageal junction as tube courses into the region of the stomach and off the inferior portion of the film as tip is not visualized but likely over the stomach. Interval placement of left-sided PICC line with tip over the SVC. Lungs are adequately inflated with stable subtle hazy density over the left base likely atelectasis. Cardiomediastinal silhouette and remainder of the exam is unchanged. IMPRESSION: Persistent subtle hazy density over the left base likely atelectasis. Tubes and lines as described.  Electronically Signed   By: Elberta Fortisaniel  Boyle M.D.   On: 12/11/2015 07:35   Ct Image Guided Drainage By Percutaneous Catheter  Result Date: 12/11/2015 CLINICAL DATA:  Peritonitis and separate fluid collections adjacent to the greater curvature the stomach and lateral to the spleen in the left upper quadrant. The patient presents for percutaneous drainage of these collections. EXAM: CT GUIDED CATHETER DRAINAGE OF PERITONEAL ABSCESS X 2 ANESTHESIA/SEDATION: 2.0 mg IV Versed 50 mcg IV Fentanyl Total Moderate Sedation Time:  36 minutes The patient's level of consciousness and physiologic status were continuously monitored during the procedure by Radiology  nursing. PROCEDURE: The procedure, risks, benefits, and alternatives were explained to the patient's wife. Questions regarding the procedure were encouraged and answered. The patient's wife understands and consents to the procedure. A time out was performed prior to initiating the procedure. The left abdominal wall was prepped with chlorhexidine in a sterile fashion, and a sterile drape was applied covering the operative field. A sterile gown and sterile gloves were used for the procedure. Local anesthesia was provided with 1% Lidocaine. CT was performed through the upper abdomen in a supine position. After localizing sites for percutaneous drain placement, 2 separate 18 gauge trocar needles were advanced under CT guidance into separate collections. Fluid aspiration was performed and a sample sent for culture analysis. Guidewires were advanced and trocar needles removed. The 2 separate tracts were dilated to 10 [place] and over guidewires, separate 10 French percutaneous drainage catheters advance. Catheter positioning was confirmed by CT. The catheters were connected to suction bulbs. The catheters were secured at the skin with Prolene retention sutures and StatLock devices. COMPLICATIONS: None FINDINGS: Both collections yielded clear fluid. Fluid adjacent to the greater curvature of the stomach was slightly blood tinged. Fluid adjacent to the spleen was yellowish in color. Separate 10 French drains were placed in both collections and are draining well after placement. IMPRESSION: CT-guided percutaneous drainage of separate left upper quadrant peritoneal collections along the greater curvature of the stomach and lateral to the spleen. 10 French drains were placed and attached to suction bulb drainage. Given similar nature of fluid from both collections, samples were combined and sent for a single abscess culture analysis. Electronically Signed   By: Irish LackGlenn  Yamagata M.D.   On: 12/11/2015 13:44   Ct Image Guided  Drainage By Percutaneous Catheter  Result Date: 12/11/2015 CLINICAL DATA:  Peritonitis and separate fluid collections adjacent to the greater curvature the stomach and lateral to the spleen in the left upper quadrant. The patient presents for percutaneous drainage of these collections. EXAM: CT GUIDED CATHETER DRAINAGE OF PERITONEAL ABSCESS X 2 ANESTHESIA/SEDATION: 2.0 mg IV Versed 50 mcg IV Fentanyl Total Moderate Sedation Time:  36 minutes The patient's level of consciousness and physiologic status were continuously monitored during the procedure by Radiology nursing. PROCEDURE: The procedure, risks, benefits, and alternatives were explained to the patient's wife. Questions regarding the procedure were encouraged and answered. The patient's wife understands and consents to the procedure. A time out was performed prior to initiating the procedure. The left abdominal wall was prepped with chlorhexidine in a sterile fashion, and a sterile drape was applied covering the operative field. A sterile gown and sterile gloves were used for the procedure. Local anesthesia was provided with 1% Lidocaine. CT was performed through the upper abdomen in a supine position. After localizing sites for percutaneous drain placement, 2 separate 18 gauge trocar needles were advanced under CT guidance into separate collections. Fluid aspiration was performed  and a sample sent for culture analysis. Guidewires were advanced and trocar needles removed. The 2 separate tracts were dilated to 10 [place] and over guidewires, separate 10 French percutaneous drainage catheters advance. Catheter positioning was confirmed by CT. The catheters were connected to suction bulbs. The catheters were secured at the skin with Prolene retention sutures and StatLock devices. COMPLICATIONS: None FINDINGS: Both collections yielded clear fluid. Fluid adjacent to the greater curvature of the stomach was slightly blood tinged. Fluid adjacent to the spleen was  yellowish in color. Separate 10 French drains were placed in both collections and are draining well after placement. IMPRESSION: CT-guided percutaneous drainage of separate left upper quadrant peritoneal collections along the greater curvature of the stomach and lateral to the spleen. 10 French drains were placed and attached to suction bulb drainage. Given similar nature of fluid from both collections, samples were combined and sent for a single abscess culture analysis. Electronically Signed   By: Irish Lack M.D.   On: 12/11/2015 13:44      [name] M 12/12/2015  Patient ID: [name], [name]   DOB: 01-30-1941, 75 y.o.   MRN: [id number]

## 2015-12-12 NOTE — Progress Notes (Signed)
Patient returned to full support at this time due to increased WOB and increased BP. ATC attempt held at this time. RT will continue to monitor patient.

## 2015-12-12 NOTE — Progress Notes (Signed)
eLink Physician-Brief Progress Note Patient Name: Danny GalRalph T Coleman DOB: [birth year] MRN: [id number]   Date of Service  12/12/2015  HPI/Events of Note  Nurse concerned about blood  oozing around J tube.  syst bp 137 and HR 99.   eICU Interventions  Stat cbc     Intervention Category Intermediate Interventions: Other:  Henry RusselSMITH, Skylen Danielsen, P 12/12/2015, 5:03 PM

## 2015-12-12 NOTE — Progress Notes (Signed)
PCCM PROGRESS NOTE  Admission date: 11/24/2015 Consult date: 12/03/2015 Referring provider: Dr. Michaell Cowing  CC: Abdominal pain  BRIEF:  75 y/o male with peptic ulcer disease admitted on 10/10 with recurrent gastric outlet obstruction secondary to gastritis admitted underwent G and J tube placement this admission.  On 10/19 had to go back to the OR after he removed his G tube accidentally leading to peritonitis, intubation, septic shock.  Subjective:  s/p IR drainage of abdominal fluid collections  S/p trach yesterday. Did not tolerate TM. On PSV 5/5 today AM  Vital signs: BP (!) 110/29   Pulse (!) 108   Temp 99.4 F (37.4 C) (Axillary)   Resp (!) 22   Ht 6\' 2"  (1.88 m)   Wt 195 lb 8.8 oz (88.7 kg)   SpO2 100%   BMI 25.11 kg/m   Intake/output: I/O last 3 completed shifts: In: 3762.2 [I.V.:2562.2; IV Piggyback:1200] Out: 4015 [Urine:2950; Emesis/NG output:600; Drains:465]   Vent settings: Vent Mode: PRVC FiO2 (%):  [28 %-100 %] 30 % Set Rate:  [14 bmp] 14 bmp Vt Set:  [660 mL] 660 mL PEEP:  [5 cmH20] 5 cmH20 Plateau Pressure:  [13 cmH20-17 cmH20] 16 cmH20  General: chronically ill appearing, on vent HEENT: NCAT, #6 perc trach in place. Trach site is clean PULM: B/L rhonchi CV: RRR, No MRG ABD: Three abdominal drains, G w/ brown purulent appearing bloody drainage. J tube also w/ old bloody drainage  MSK: normal bulk and tone Derm: anasarca, thin skin Neuro: Asleep, stirs to touch, doesn't follow commands, intermittently.  CMP Latest Ref Rng & Units 12/12/2015 12/11/2015 12/10/2015  Glucose 65 - 99 mg/dL 161(W) 960(A) 540(J)  BUN 6 - 20 mg/dL 81(X) 91(Y) 78(G)  Creatinine 0.61 - 1.24 mg/dL 9.56(O) 1.30(Q) 6.57(Q)  Sodium 135 - 145 mmol/L 145 143 143  Potassium 3.5 - 5.1 mmol/L 3.4(L) 3.4(L) 3.0(L)  Chloride 101 - 111 mmol/L 114(H) 116(H) 114(H)  CO2 22 - 32 mmol/L 24 22 22   Calcium 8.9 - 10.3 mg/dL 7.8(L) 7.4(L) 7.6(L)  Total Protein 6.5 - 8.1 g/dL - - -  Total  Bilirubin 0.3 - 1.2 mg/dL - - -  Alkaline Phos 38 - 126 U/L - - -  AST 15 - 41 U/L - - -  ALT 17 - 63 U/L - - -    CBC Latest Ref Rng & Units 12/11/2015 12/10/2015 12/10/2015  WBC 4.0 - 10.5 K/uL 16.4(H) 18.5(H) 14.4(H)  Hemoglobin 13.0 - 17.0 g/dL 7.3(L) 8.3(L) 7.7(L)  Hematocrit 39.0 - 52.0 % 21.4(L) 24.2(L) 22.6(L)  Platelets 150 - 400 K/uL 468(H) 508(H) 446(H)   ABG    Component Value Date/Time   PHART 7.344 (L) 12/07/2015 2230   PCO2ART 31.4 (L) 12/07/2015 2230   PO2ART 132 (H) 12/07/2015 2230   HCO3 16.7 (L) 12/07/2015 2230   TCO2 26 12/04/2007 0638   ACIDBASEDEF 7.6 (H) 12/07/2015 2230   O2SAT 98.3 12/07/2015 2230   CBG (last 3)   Recent Labs  12/11/15 2044 12/11/15 2332 12/12/15 0355  GLUCAP 129* 127* 159*   Imaging: Ct Abdomen Pelvis W Contrast  Result Date: 12/10/2015 CLINICAL DATA:  One week postop from laparoscopy for gastric and jejunal perforation. Previous distal gastrectomy with Billroth 1 for ulcer disease. EXAM: CT ABDOMEN AND PELVIS WITH CONTRAST TECHNIQUE: Multidetector CT imaging of the abdomen and pelvis was performed using the standard protocol following bolus administration of intravenous contrast. CONTRAST:  80mL ISOVUE-300 IOPAMIDOL (ISOVUE-300) INJECTION 61% COMPARISON:  11/25/2015 FINDINGS: Lower Chest: New  small pleural effusions and basilar atelectasis, left side greater than right. Hepatobiliary: No mass identified. Prior cholecystectomy noted. No evidence of biliary dilatation. Pancreas:  No mass or inflammatory changes. Spleen: Within normal limits in size and appearance. Adrenals/Urinary Tract: No masses identified. No evidence of hydronephrosis. Foley catheter seen within the urinary bladder which is decompressed. Stomach/Bowel: Nasogastric tube and percutaneous gastrostomy tube are seen in the stomach. Percutaneous jejunostomy tube seen in place as well as several surgical drains. Moderate dilatation of proximal and mid small bowel loops is seen  with transition point in the anterior right lower quadrant on image 59/2, consistent with a partial small bowel obstruction. Distal small bowel loops are nondilated. Pneumatosis is seen involving multiple dilated proximal and mid small bowel loops in the left abdomen and pelvis, suspicious for bowel ischemia. There is no evidence of portal venous gas or free intraperitoneal air. A left upper quadrant fluid collection is seen along the lateral aspect of proximal stomach in the gastrohepatic ligament which measures 6.2 x 9.0 cm. A left subdiaphragmatic/perisplenic fluid collection is also seen which measures 4.2 x 9.6 cm. These may represent postop fluid collections or abscesses. Small amount of free fluid noted in pelvic cul-de-sac. Vascular/Lymphatic: No pathologically enlarged lymph nodes. No abdominal aortic aneurysm. Aortic atherosclerosis. Reproductive:  No mass identified. Other:  Diffuse mesenteric and body wall edema. Musculoskeletal:  No suspicious bone lesions identified. IMPRESSION: Moderate dilatation of proximal and mid small bowel loops, with transition point in anterior right lower quadrant, suspicious for partial small bowel obstruction. Pneumatosis involving multiple dilated small bowel loops in the left abdomen pelvis, suspicious for bowel ischemia. No evidence of portal venous gas or free intraperitoneal air. Two left upper quadrant fluid collections adjacent to the stomach and spleen, which may represent postoperative fluid collections or abscesses. Small amount free fluid also noted in pelvis. Small bilateral pleural effusions and bibasilar atelectasis. Diffuse mesenteric and body wall edema. Electronically Signed   By: Myles RosenthalJohn  Stahl M.D.   On: 12/10/2015 15:30   Dg Chest Port 1 View  Result Date: 12/11/2015 CLINICAL DATA:  Tracheostomy tube EXAM: PORTABLE CHEST 1 VIEW COMPARISON:  400 hour FINDINGS: The endotracheal tube has been exchanged for a Shiley tracheostomy tube. Tip is 6.7 cm from  the carina. Lungs are hyperaerated. Hazy opacity at the left base has improved. Normal heart size. No pneumothorax. Left subclavian central venous catheter and left PICC are stable. IMPRESSION: Tracheostomy tube as described. Improved hazy airspace disease at the left base. Electronically Signed   By: Jolaine ClickArthur  Hoss M.D.   On: 12/11/2015 09:37   Dg Chest Port 1 View  Result Date: 12/11/2015 CLINICAL DATA:  Respiratory failure, endotracheal tube position. EXAM: PORTABLE CHEST 1 VIEW COMPARISON:  12/09/2015 FINDINGS: Endotracheal tube unchanged with tip 5.2 cm above the carina. Left subclavian central venous catheter unchanged. Interval placement of nasogastric tube with side port just above the expected region of the gastroesophageal junction as tube courses into the region of the stomach and off the inferior portion of the film as tip is not visualized but likely over the stomach. Interval placement of left-sided PICC line with tip over the SVC. Lungs are adequately inflated with stable subtle hazy density over the left base likely atelectasis. Cardiomediastinal silhouette and remainder of the exam is unchanged. IMPRESSION: Persistent subtle hazy density over the left base likely atelectasis. Tubes and lines as described. Electronically Signed   By: Elberta Fortisaniel  Boyle M.D.   On: 12/11/2015 07:35   Ct Image  Guided Drainage By Percutaneous Catheter  Result Date: 12/11/2015 CLINICAL DATA:  Peritonitis and separate fluid collections adjacent to the greater curvature the stomach and lateral to the spleen in the left upper quadrant. The patient presents for percutaneous drainage of these collections. EXAM: CT GUIDED CATHETER DRAINAGE OF PERITONEAL ABSCESS X 2 ANESTHESIA/SEDATION: 2.0 mg IV Versed 50 mcg IV Fentanyl Total Moderate Sedation Time:  36 minutes The patient's level of consciousness and physiologic status were continuously monitored during the procedure by Radiology nursing. PROCEDURE: The procedure, risks,  benefits, and alternatives were explained to the patient's wife. Questions regarding the procedure were encouraged and answered. The patient's wife understands and consents to the procedure. A time out was performed prior to initiating the procedure. The left abdominal wall was prepped with chlorhexidine in a sterile fashion, and a sterile drape was applied covering the operative field. A sterile gown and sterile gloves were used for the procedure. Local anesthesia was provided with 1% Lidocaine. CT was performed through the upper abdomen in a supine position. After localizing sites for percutaneous drain placement, 2 separate 18 gauge trocar needles were advanced under CT guidance into separate collections. Fluid aspiration was performed and a sample sent for culture analysis. Guidewires were advanced and trocar needles removed. The 2 separate tracts were dilated to 10 [place] and over guidewires, separate 10 French percutaneous drainage catheters advance. Catheter positioning was confirmed by CT. The catheters were connected to suction bulbs. The catheters were secured at the skin with Prolene retention sutures and StatLock devices. COMPLICATIONS: None FINDINGS: Both collections yielded clear fluid. Fluid adjacent to the greater curvature of the stomach was slightly blood tinged. Fluid adjacent to the spleen was yellowish in color. Separate 10 French drains were placed in both collections and are draining well after placement. IMPRESSION: CT-guided percutaneous drainage of separate left upper quadrant peritoneal collections along the greater curvature of the stomach and lateral to the spleen. 10 French drains were placed and attached to suction bulb drainage. Given similar nature of fluid from both collections, samples were combined and sent for a single abscess culture analysis. Electronically Signed   By: Irish Lack M.D.   On: 12/11/2015 13:44   Ct Image Guided Drainage By Percutaneous Catheter  Result  Date: 12/11/2015 CLINICAL DATA:  Peritonitis and separate fluid collections adjacent to the greater curvature the stomach and lateral to the spleen in the left upper quadrant. The patient presents for percutaneous drainage of these collections. EXAM: CT GUIDED CATHETER DRAINAGE OF PERITONEAL ABSCESS X 2 ANESTHESIA/SEDATION: 2.0 mg IV Versed 50 mcg IV Fentanyl Total Moderate Sedation Time:  36 minutes The patient's level of consciousness and physiologic status were continuously monitored during the procedure by Radiology nursing. PROCEDURE: The procedure, risks, benefits, and alternatives were explained to the patient's wife. Questions regarding the procedure were encouraged and answered. The patient's wife understands and consents to the procedure. A time out was performed prior to initiating the procedure. The left abdominal wall was prepped with chlorhexidine in a sterile fashion, and a sterile drape was applied covering the operative field. A sterile gown and sterile gloves were used for the procedure. Local anesthesia was provided with 1% Lidocaine. CT was performed through the upper abdomen in a supine position. After localizing sites for percutaneous drain placement, 2 separate 18 gauge trocar needles were advanced under CT guidance into separate collections. Fluid aspiration was performed and a sample sent for culture analysis. Guidewires were advanced and trocar needles removed. The 2 separate tracts  were dilated to 10 [place] and over guidewires, separate 10 French percutaneous drainage catheters advance. Catheter positioning was confirmed by CT. The catheters were connected to suction bulbs. The catheters were secured at the skin with Prolene retention sutures and StatLock devices. COMPLICATIONS: None FINDINGS: Both collections yielded clear fluid. Fluid adjacent to the greater curvature of the stomach was slightly blood tinged. Fluid adjacent to the spleen was yellowish in color. Separate 10 French drains  were placed in both collections and are draining well after placement. IMPRESSION: CT-guided percutaneous drainage of separate left upper quadrant peritoneal collections along the greater curvature of the stomach and lateral to the spleen. 10 French drains were placed and attached to suction bulb drainage. Given similar nature of fluid from both collections, samples were combined and sent for a single abscess culture analysis. Electronically Signed   By: Irish Lack M.D.   On: 12/11/2015 13:44  PCXR w/ minimal L base atx. Aeration improved. Trach good position.   Studies: 10/11 CT abdomen/pelvis> significant distension of the stomach with layering fluid and gas, NG present, worrisome for recurrent gastric outlet obstruction.  RLL consolidation, cholecycstectomy 10/26 CT abdomen/pelvis> Moderate dilatation of proximal and mid small bowel loops, with transition point in anterior right lower quadrant, suspicious for partial small bowel obstruction. Pneumatosis involving multiple dilated small bowel loops in the left abdomen pelvis, suspicious for bowel ischemia. No evidence of portal venous gas or free intraperitoneal air. Two left upper quadrant fluid collections adjacent to the stomach and spleen, which may represent postoperative fluid collections or abscesses. Small amount free fluid also noted in pelvis.Small bilateral pleural effusions and bibasilar atelectasis. Diffuse mesenteric and body wall edema.  Cultures: Blood 10/19 >> neg Fluid cx 10/17 >> pending  Antibiotics: Zosyn 10/19 >>  Anidulafungin 10/19 >> 10/26   Events: 10/10  admission 10/17  Lap placement of J & G tubes, EGD w/ balloon dilation of duodenal stricture, gastric biopsies, closure of gastrotomy 10/18  sundowning, pulled out G tube 10/19  laparaoscopy (findings gastric leak, bilious ascites, jejunal disruption), omentoplexy of jejunal disruption, gastric leak 10/24 extubated. Required re-intubation.  10/25: placed on  precedex for acute encephalopathy; new drainage from J tube. tubefeeds stopped.   10/26 Heparin off since this am (as recommended by vascular). Still on pressors. Getting albumin and lasix to a/w mobilizing third spaced fluid/anasarca. Developed rectal bleeding. This has initiated after starting bowel prep. Hgb has drifted from 8 to 7.7 --> stopped [place] heparin. CT found evidence of bowel pneumotosis worrisome for ischemia, and two LUQ fluid collections 10/27: trach placed. TO IR for Perc drains to be placed in LUQ   Lines/tube: 10/19 ETT >> 10/24 then replaced 10/24 > 10/27 10/19 CVL left subclavian >>  10/19 R brachial arterial line >> 10/20 10/19 abdominal drain x3 >> 10/19 G tube >> 10/19 J tube >> 10/27 Trach placed (feinstein # 6 cuffed), Abd drains by IR  Summary: 75 yo male former smoker with progressive epigastric pain, nausea, vomiting, bloating from gastric outlet obstruction with pyloric channel ulcer.  Had Billroth 1, vagotomy, fundoplication, omentopexy August 2017 with initial improvement.  Had recurrent symptoms September 2017 that become progressively worse with gastric dilation from recurrent stricture.  Had laparoscopic placement of jejunostomy tube, gastrostomy tube, and EGD balloon dilation of duodenal stricture 10/17.  Developed gastric leakage with peritonitis after pulling out PEG tube 10/19 and taken to OR. On 10/27 found 2 new LUQ abscesses/fluid collections. S/o IR drainage and perc trach 10/27. PMHx of  CAD s/p stent, GERD, HTN, HLD  Assessment/plan:  Acute respiratory failure > normal vent mechanics, not able to protect airway Tracheostomy status (placed 10/27) due prolonged critical illness, deconditioning and severe protein calorie malnutrition. Major barrier appeared to be delirium and ability handle secretions, deconditioning and malnutrition.  - PSV trials as tolerated. Try TM again today - Cont routine trach care  - Get OOB and have PT see him  Septic shock  2nd to peritonitis > back on pressors 10/25-10/26, suspect this was due to precedex Hx of CAD, HTN, HLD - MAP goal > 55/ CVP >8 - continue IV fluids - hold outpt asa, lipitor, diovan  Recurrent gastric outlet obstruction Peritonitis] New Left Upper quad X2 abscess/fluid collections Nutrition needs - post-op care, nutrition per [facility] - holding tubefeeds - Day 8 of zosyn-->will continue - Follow fluid cx from yesterday  AKI >> worse. He is not volume overloaded. Suspect aggressive diuretics for anasarca have added to this.  Hypokalemia Hyperchloremia  - Off lasix and albumin  - replace K - monitor renal function, urine outpt - renal adjust meds   Acute metabolic encephalopathy > minimal Post-op pain Severe Physical deconditioning  - RASS goal -1 - PRN fent, minimize versed - scheduled tylenol  - get OOB and order PT consult 10/28  Rt index finger ischemia likely from Rt brachial a line. > improved, no clot on upper ext doppler ultrasound - VVS consulted 10/20, appreciate input-->resolved.  - no more Alines right arm   Hyperglycemia - SSI  Anasarca - cont nutritional support  - Held lasix 10/27  Protein malnutrition - per Surgery; cont TNA, anticipate tube feed trials again soon.   SUP - Protonix DVT prophylaxis - SCDs Goals of care - Full code  Chilton GreathousePraveen Chasty Randal MD [facility] Pager [phone] If no answer or after 3pm call: [phone] 12/12/2015, 8:19 AM

## 2015-12-13 ENCOUNTER — Inpatient Hospital Stay (HOSPITAL_COMMUNITY): Payer: Medicare Other

## 2015-12-13 HISTORY — PX: IR GENERIC HISTORICAL: IMG1180011

## 2015-12-13 LAB — CBC
HCT: 25 % — ABNORMAL LOW (ref 39.0–52.0)
HEMATOCRIT: 25 % — AB (ref 39.0–52.0)
Hemoglobin: 8.3 g/dL — ABNORMAL LOW (ref 13.0–17.0)
Hemoglobin: 8.6 g/dL — ABNORMAL LOW (ref 13.0–17.0)
MCH: 30.3 pg (ref 26.0–34.0)
MCH: 29.6 pg (ref 26.0–34.0)
MCHC: 33.2 g/dL (ref 30.0–36.0)
MCHC: 34.4 g/dL (ref 30.0–36.0)
MCV: 91.2 fL (ref 78.0–100.0)
MCV: 85.9 fL (ref 78.0–100.0)
PLATELETS: 553 10*3/uL — AB (ref 150–400)
Platelets: 493 10*3/uL — ABNORMAL HIGH (ref 150–400)
RBC: 2.74 MIL/uL — ABNORMAL LOW (ref 4.22–5.81)
RBC: 2.91 MIL/uL — ABNORMAL LOW (ref 4.22–5.81)
RDW: 16.1 % — AB (ref 11.5–15.5)
RDW: 15.9 % — AB (ref 11.5–15.5)
WBC: 11.3 10*3/uL — AB (ref 4.0–10.5)
WBC: 9.8 10*3/uL (ref 4.0–10.5)

## 2015-12-13 LAB — COMPREHENSIVE METABOLIC PANEL
ALT: 20 U/L (ref 17–63)
AST: 24 U/L (ref 15–41)
Albumin: 2.1 g/dL — ABNORMAL LOW (ref 3.5–5.0)
Alkaline Phosphatase: 121 U/L (ref 38–126)
Anion gap: 4 — ABNORMAL LOW (ref 5–15)
BILIRUBIN TOTAL: 2.2 mg/dL — AB (ref 0.3–1.2)
BUN: 37 mg/dL — AB (ref 6–20)
CO2: 23 mmol/L (ref 22–32)
CREATININE: 1.15 mg/dL (ref 0.61–1.24)
Calcium: 8 mg/dL — ABNORMAL LOW (ref 8.9–10.3)
Chloride: 120 mmol/L — ABNORMAL HIGH (ref 101–111)
GFR calc Af Amer: >60 mL/min (ref >60)
GFR, EST NON AFRICAN AMERICAN: 60 mL/min — AB (ref >60)
Glucose, Bld: 156 mg/dL — ABNORMAL HIGH (ref 65–99)
Potassium: 3.5 mmol/L (ref 3.5–5.1)
Sodium: 147 mmol/L — ABNORMAL HIGH (ref 135–145)
TOTAL PROTEIN: 5.3 g/dL — AB (ref 6.5–8.1)

## 2015-12-13 LAB — GLUCOSE, CAPILLARY
GLUCOSE-CAPILLARY: 103 mg/dL — AB (ref 65–99)
Glucose-Capillary: 136 mg/dL — ABNORMAL HIGH (ref 65–99)
Glucose-Capillary: 150 mg/dL — ABNORMAL HIGH (ref 65–99)
Glucose-Capillary: 151 mg/dL — ABNORMAL HIGH (ref 65–99)
Glucose-Capillary: 151 mg/dL — ABNORMAL HIGH (ref 65–99)
Glucose-Capillary: 159 mg/dL — ABNORMAL HIGH (ref 65–99)

## 2015-12-13 LAB — PROTIME-INR
INR: 1.26
Prothrombin Time: 15.9 seconds — ABNORMAL HIGH (ref 11.4–15.2)

## 2015-12-13 LAB — PREPARE RBC (CROSSMATCH)

## 2015-12-13 LAB — APTT: aPTT: 38 seconds — ABNORMAL HIGH (ref 24–36)

## 2015-12-13 MED ORDER — LIDOCAINE HCL 1 % IJ SOLN
INTRAMUSCULAR | Status: AC
  Filled 2015-12-13: qty 20

## 2015-12-13 MED ORDER — FAT EMULSION 20 % IV EMUL
240.0000 mL | INTRAVENOUS | Status: AC
  Administered 2015-12-13: 240 mL via INTRAVENOUS
  Filled 2015-12-13 (×2): qty 250

## 2015-12-13 MED ORDER — FENTANYL CITRATE (PF) 100 MCG/2ML IJ SOLN
INTRAMUSCULAR | Status: AC
  Administered 2015-12-13: 50 ug
  Filled 2015-12-13: qty 2

## 2015-12-13 MED ORDER — FAMOTIDINE 200 MG/20ML IV SOLN
40.0000 mg | Freq: Two times a day (BID) | INTRAVENOUS | Status: DC
  Administered 2015-12-13 – 2015-12-16 (×9): 40 mg via INTRAVENOUS
  Filled 2015-12-13 (×11): qty 4

## 2015-12-13 MED ORDER — IOPAMIDOL (ISOVUE-300) INJECTION 61%
15.0000 mL | Freq: Once | INTRAVENOUS | Status: AC | PRN
  Administered 2015-12-13: 15 mL via INTRAVENOUS

## 2015-12-13 MED ORDER — TRACE MINERALS CR-CU-MN-SE-ZN 10-1000-500-60 MCG/ML IV SOLN
83.0000 mL/h | INTRAVENOUS | Status: AC
  Administered 2015-12-13: 17:00:00 via INTRAVENOUS
  Filled 2015-12-13 (×2): qty 1992

## 2015-12-13 MED ORDER — LIDOCAINE VISCOUS 2 % MT SOLN
OROMUCOSAL | Status: AC
  Filled 2015-12-13: qty 15

## 2015-12-13 NOTE — Progress Notes (Signed)
PCCM PROGRESS NOTE  Admission date: 11/24/2015 Consult date: 12/03/2015 Referring provider: Dr. Michaell Cowing  CC: Abdominal pain  BRIEF:  75 y/o male with peptic ulcer disease admitted on 10/10 with recurrent gastric outlet obstruction secondary to gastritis admitted underwent G and J tube placement this admission.  On 10/19 had to go back to the OR after he removed his G tube accidentally leading to peritonitis, intubation, septic shock.  Subjective:  On PSV 5/5 today AM Oozing blood form J tube. Transfused 2 units PRBC. J tube site assessed by surgery overnight.  Vital signs: BP (!) 158/63   Pulse 84   Temp 97.7 F (36.5 C) (Axillary)   Resp 18   Ht 6\' 2"  (1.88 m)   Wt 193 lb 2 oz (87.6 kg)   SpO2 98%   BMI 24.80 kg/m   Intake/output: I/O last 3 completed shifts: In: 4177.9 [I.V.:3320.6; Blood:33.3; Other:120; IV Piggyback:704] Out: 4630 [Urine:2850; Emesis/NG output:1400; Drains:380]   Vent settings: Vent Mode: PRVC FiO2 (%):  [30 %] 30 % Set Rate:  [14 bmp] 14 bmp Vt Set:  [660 mL] 660 mL PEEP:  [5 cmH20] 5 cmH20 Plateau Pressure:  [14 cmH20-18 cmH20] 17 cmH20  General: chronically ill appearing, on vent HEENT: NCAT, #6 perc trach in place. Trach site is clean PULM: B/L rhonchi CV: RRR, No MRG ABD: Abdominal drains, J tube dressing with bilious, bloody drainage MSK: normal bulk and tone Derm: anasarca, thin skin Neuro: Awake, no focal deficits.  CMP Latest Ref Rng & Units 12/13/2015 12/12/2015 12/11/2015  Glucose 65 - 99 mg/dL 161(W) 960(A) 540(J)  BUN 6 - 20 mg/dL 81(X) 91(Y) 78(G)  Creatinine 0.61 - 1.24 mg/dL 9.56 2.13(Y) 8.65(H)  Sodium 135 - 145 mmol/L 147(H) 145 143  Potassium 3.5 - 5.1 mmol/L 3.5 3.4(L) 3.4(L)  Chloride 101 - 111 mmol/L 120(H) 114(H) 116(H)  CO2 22 - 32 mmol/L 23 24 22   Calcium 8.9 - 10.3 mg/dL 8.0(L) 7.8(L) 7.4(L)  Total Protein 6.5 - 8.1 g/dL 5.3(L) - -  Total Bilirubin 0.3 - 1.2 mg/dL 2.2(H) - -  Alkaline Phos 38 - 126 U/L 121 - -   AST 15 - 41 U/L 24 - -  ALT 17 - 63 U/L 20 - -    CBC Latest Ref Rng & Units 12/13/2015 12/12/2015 12/11/2015  WBC 4.0 - 10.5 K/uL 11.3(H) 11.7(H) 16.4(H)  Hemoglobin 13.0 - 17.0 g/dL 8.3(L) 6.3(LL) 7.3(L)  Hematocrit 39.0 - 52.0 % 25.0(L) 18.5(L) 21.4(L)  Platelets 150 - 400 K/uL 493(H) 519(H) 468(H)   ABG    Component Value Date/Time   PHART 7.344 (L) 12/07/2015 2230   PCO2ART 31.4 (L) 12/07/2015 2230   PO2ART 132 (H) 12/07/2015 2230   HCO3 16.7 (L) 12/07/2015 2230   TCO2 26 12/04/2007 0638   ACIDBASEDEF 7.6 (H) 12/07/2015 2230   O2SAT 98.3 12/07/2015 2230   CBG (last 3)   Recent Labs  12/12/15 2330 12/13/15 0403 12/13/15 0758  GLUCAP 140* 151* 103*   Imaging: Ct Image Guided Drainage By Percutaneous Catheter  Result Date: 12/11/2015 CLINICAL DATA:  Peritonitis and separate fluid collections adjacent to the greater curvature the stomach and lateral to the spleen in the left upper quadrant. The patient presents for percutaneous drainage of these collections. EXAM: CT GUIDED CATHETER DRAINAGE OF PERITONEAL ABSCESS X 2 ANESTHESIA/SEDATION: 2.0 mg IV Versed 50 mcg IV Fentanyl Total Moderate Sedation Time:  36 minutes The patient's level of consciousness and physiologic status were continuously monitored during the procedure by Radiology  nursing. PROCEDURE: The procedure, risks, benefits, and alternatives were explained to the patient's wife. Questions regarding the procedure were encouraged and answered. The patient's wife understands and consents to the procedure. A time out was performed prior to initiating the procedure. The left abdominal wall was prepped with chlorhexidine in a sterile fashion, and a sterile drape was applied covering the operative field. A sterile gown and sterile gloves were used for the procedure. Local anesthesia was provided with 1% Lidocaine. CT was performed through the upper abdomen in a supine position. After localizing sites for percutaneous drain  placement, 2 separate 18 gauge trocar needles were advanced under CT guidance into separate collections. Fluid aspiration was performed and a sample sent for culture analysis. Guidewires were advanced and trocar needles removed. The 2 separate tracts were dilated to 10 [place] and over guidewires, separate 10 French percutaneous drainage catheters advance. Catheter positioning was confirmed by CT. The catheters were connected to suction bulbs. The catheters were secured at the skin with Prolene retention sutures and StatLock devices. COMPLICATIONS: None FINDINGS: Both collections yielded clear fluid. Fluid adjacent to the greater curvature of the stomach was slightly blood tinged. Fluid adjacent to the spleen was yellowish in color. Separate 10 French drains were placed in both collections and are draining well after placement. IMPRESSION: CT-guided percutaneous drainage of separate left upper quadrant peritoneal collections along the greater curvature of the stomach and lateral to the spleen. 10 French drains were placed and attached to suction bulb drainage. Given similar nature of fluid from both collections, samples were combined and sent for a single abscess culture analysis. Electronically Signed   By: Irish LackGlenn  Yamagata M.D.   On: 12/11/2015 13:44   Ct Image Guided Drainage By Percutaneous Catheter  Result Date: 12/11/2015 CLINICAL DATA:  Peritonitis and separate fluid collections adjacent to the greater curvature the stomach and lateral to the spleen in the left upper quadrant. The patient presents for percutaneous drainage of these collections. EXAM: CT GUIDED CATHETER DRAINAGE OF PERITONEAL ABSCESS X 2 ANESTHESIA/SEDATION: 2.0 mg IV Versed 50 mcg IV Fentanyl Total Moderate Sedation Time:  36 minutes The patient's level of consciousness and physiologic status were continuously monitored during the procedure by Radiology nursing. PROCEDURE: The procedure, risks, benefits, and alternatives were explained to  the patient's wife. Questions regarding the procedure were encouraged and answered. The patient's wife understands and consents to the procedure. A time out was performed prior to initiating the procedure. The left abdominal wall was prepped with chlorhexidine in a sterile fashion, and a sterile drape was applied covering the operative field. A sterile gown and sterile gloves were used for the procedure. Local anesthesia was provided with 1% Lidocaine. CT was performed through the upper abdomen in a supine position. After localizing sites for percutaneous drain placement, 2 separate 18 gauge trocar needles were advanced under CT guidance into separate collections. Fluid aspiration was performed and a sample sent for culture analysis. Guidewires were advanced and trocar needles removed. The 2 separate tracts were dilated to 10 [place] and over guidewires, separate 10 French percutaneous drainage catheters advance. Catheter positioning was confirmed by CT. The catheters were connected to suction bulbs. The catheters were secured at the skin with Prolene retention sutures and StatLock devices. COMPLICATIONS: None FINDINGS: Both collections yielded clear fluid. Fluid adjacent to the greater curvature of the stomach was slightly blood tinged. Fluid adjacent to the spleen was yellowish in color. Separate 10 French drains were placed in both collections and are draining well  after placement. IMPRESSION: CT-guided percutaneous drainage of separate left upper quadrant peritoneal collections along the greater curvature of the stomach and lateral to the spleen. 10 French drains were placed and attached to suction bulb drainage. Given similar nature of fluid from both collections, samples were combined and sent for a single abscess culture analysis. Electronically Signed   By: Irish Lack M.D.   On: 12/11/2015 13:44  PCXR w/ minimal L base atx. Aeration improved. Trach good position.   Studies: 10/11 CT abdomen/pelvis>  significant distension of the stomach with layering fluid and gas, NG present, worrisome for recurrent gastric outlet obstruction.  RLL consolidation, cholecycstectomy 10/26 CT abdomen/pelvis> Moderate dilatation of proximal and mid small bowel loops, with transition point in anterior right lower quadrant, suspicious for partial small bowel obstruction. Pneumatosis involving multiple dilated small bowel loops in the left abdomen pelvis, suspicious for bowel ischemia. No evidence of portal venous gas or free intraperitoneal air. Two left upper quadrant fluid collections adjacent to the stomach and spleen, which may represent postoperative fluid collections or abscesses. Small amount free fluid also noted in pelvis.Small bilateral pleural effusions and bibasilar atelectasis. Diffuse mesenteric and body wall edema.  Cultures: Blood 10/19 >> neg Fluid cx 10/17 >> pending  Antibiotics: Zosyn 10/19 >>  Anidulafungin 10/19 >> 10/26   Events: 10/10  admission 10/17  Lap placement of J & G tubes, EGD w/ balloon dilation of duodenal stricture, gastric biopsies, closure of gastrotomy 10/18  sundowning, pulled out G tube 10/19  laparaoscopy (findings gastric leak, bilious ascites, jejunal disruption), omentoplexy of jejunal disruption, gastric leak 10/24 extubated. Required re-intubation.  10/25: placed on precedex for acute encephalopathy; new drainage from J tube. tubefeeds stopped.   10/26 Heparin off since this am (as recommended by vascular). Still on pressors. Getting albumin and lasix to a/w mobilizing third spaced fluid/anasarca. Developed rectal bleeding. This has initiated after starting bowel prep. Hgb has drifted from 8 to 7.7 --> stopped [place] heparin. CT found evidence of bowel pneumotosis worrisome for ischemia, and two LUQ fluid collections 10/27: trach placed. 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  Had Billroth 1, vagotomy, fundoplication, omentopexy August 2017 with initial improvement.  Had recurrent symptoms September 2017 that become progressively worse with gastric dilation from recurrent stricture.  Had laparoscopic placement of jejunostomy tube, gastrostomy tube, and EGD balloon dilation of duodenal stricture 10/17.  Developed gastric leakage with peritonitis after pulling out PEG tube 10/19 and taken to OR. On 10/27 found 2 new LUQ abscesses/fluid collections. S/o IR drainage and perc trach 10/27. PMHx of CAD s/p stent, GERD, HTN, HLD  Assessment/plan:  Acute respiratory failure > normal vent mechanics, not able to protect airway Tracheostomy (placed 10/27) due prolonged critical illness, deconditioning and severe protein calorie malnutrition. Major barrier appeared to be delirium and ability handle secretions, deconditioning and malnutrition.  - PSV trials as tolerated. - Cont routine trach care   Recurrent gastric outlet obstruction Recurrent GI bleed with new oozing from J tube Abdominal abscess/fluid collections s/p IR drains - Follow Hb post transfusion -  Surgery to manage GI bleed.  - holding tubefeeds - Day 10 of zosyn-->will continue - Follow fluid cx from IR tap.  AKI >> worse. He is not volume overloaded. Suspect aggressive diuretics for anasarca have added to this.  Hypokalemia Hyperchloremia  - Holding lasix and albumin  - monitor renal function, urine outpt - renal adjust meds   Acute metabolic encephalopathy > minimal Post-op pain Severe Physical deconditioning  - RASS goal -1 - PRN fent, minimize versed - scheduled tylenol.  Rt index  finger ischemia likely from Rt brachial a line. > improved, no clot on upper ext doppler ultrasound - VVS consulted 10/20, appreciate input-->resolved.  - no more Alines right arm  Hyperglycemia - SSI  Anasarca - cont nutritional support  - Held lasix 10/27  Protein malnutrition - per Surgery; continue TNA.  SUP - Protonix DVT prophylaxis - SCDs Goals of care - Full code  Chilton GreathousePraveen Kaislyn Gulas MD [facility] Pager [phone] If no answer or after 3pm call: [phone] 12/13/2015, 9:42 AM

## 2015-12-13 NOTE — Procedures (Signed)
LLQ J tube replaced 12 Fr No comp

## 2015-12-13 NOTE — Progress Notes (Signed)
Pt had very large bloody BM.  PCCM and CCS notified.  Vital signs are stable following the occurrence. No further orders provided.   Will continue to monitor.

## 2015-12-13 NOTE — Progress Notes (Signed)
Pt requested to use bedpan.  Upon getting pt off of bedpan, the J-tube had become completely dislodged from the patient.  Dr. Gerrit FriendsGerkin was immediately made aware.  Orders were placed to remove the plastic bumper remaining, and placed a gauze dressing with and secure with tape over the site.    Site was unremarkable, other than some previously noted drainage.  Old drainage was cleaned from the site.  Gauze and tape placed over site.  Pt's VSS.  Family at bedside during the occurrence.  Made aware of the plan made by Dr. Gerrit FriendsGerkin.  Will continue to monitor.

## 2015-12-13 NOTE — Progress Notes (Signed)
Rosenbower MD notified of blood draining from J-tube insertion site and draining of 100cc dark bloody drainage into collection chamber of J-tube bag Md orders to start pt on Pepcid 40 mg twice daily and apply sandbag to J-tube site. Will continue to monitor.

## 2015-12-13 NOTE — Consult Note (Signed)
[facility] Consultation Note  Referring Provider: Dr. Armandina Gemma ([facility]) Primary Care Physician:  Irven Shelling, MD Primary Gastroenterologist:  Dr. Arta Silence  Reason for Consultation:  Blood in stool, blood per jejunal tube  HPI: [name] is a 75 y.o. male well known to me with complicated post-operative course after Bilroth-I gastroduodenostomy for benign pyloric stricture with gastric outlet obstruction.  Symptoms of gastric outlet obstruction persisted post-operatively and repeat endoscopy showed tight stenosis of gastroduodenal anastomosis, through which endoscope could not pass.  Patient had surgical operative balloon dilatation of stricture and G-tube and J-tube placement couple weeks ago.  This surgery was complicated by post-operative abscess for which multiple JP drains were placed.  I was asked today to see patient for GI bleeding.  Over the past day, patient has had several episodes of hematochezia.  He has also had some dark blood colored output through jejunostomy (which just recently became accidentally dislodged).  Screening colonoscopy in 2009 by Dr. Sammuel Cooper showed left-sided diverticulosis.  Patient denies significant abdominal pain, but is diffusely sore through his abdomen.  He now has tracheostomy, which is not bothering him nearly as much as the endotracheal tube did.       Past Medical History:  Diagnosis Date  . Bilateral dry eyes   . Coronary artery disease 1997   bare mental stent right coronart artery  . Degenerative disc disease    LOWER BACK  . Dysphonia   . ED (erectile dysfunction)   . Gastroesophageal reflux disease   . Hyperlipidemia   . Hypertension   . Peptic ulcer   . Pneumonia    HISTORY OF IN CHILDHOOD  . Posterior vitreous detachment 10/30/2012  . Urinary hesitancy   . Wears glasses     Past Surgical History:  Procedure Laterality Date  . CARDIAC CATHETERIZATION    . CHOLECYSTECTOMY    . ESOPHAGOGASTRODUODENOSCOPY  N/A 12/01/2015   Procedure: ESOPHAGOGASTRODUODENOSCOPY (EGD) BALLOON DILATION OF DUODENAL STRICTURE;  Surgeon: Michael Boston, MD;  Location: [facility];  Service: General;  Laterality: N/A;  . ESOPHAGOGASTRODUODENOSCOPY (EGD) WITH PROPOFOL N/A 07/01/2015   Procedure: ESOPHAGOGASTRODUODENOSCOPY (EGD) WITH PROPOFOL;  Surgeon: Arta Silence, MD;  Location: [facility];  Service: Endoscopy;  Laterality: N/A;  . ESOPHAGOGASTRODUODENOSCOPY (EGD) WITH PROPOFOL N/A 11/23/2015   Procedure: ESOPHAGOGASTRODUODENOSCOPY (EGD) WITH PROPOFOL;  Surgeon: Arta Silence, MD;  Location: [facility] ENDOSCOPY;  Service: Endoscopy;  Laterality: N/A;  may need to intubate  . EYE SURGERY     growth on right eye removed   . LAPAROSCOPIC GASTROSTOMY N/A 12/01/2015   Procedure: LAPAROSCOPIC PLACEMENT OF FEEDING JEJUNOSTOMY AND GASTROSTOMY TUBE;  Surgeon: Michael Boston, MD;  Location: [facility];  Service: General;  Laterality: N/A;  . LAPAROSCOPY N/A 12/03/2015   Procedure: LAPAROSCOPY DIAGNOSTIC, OMENTOPEXY, JEJUNOSTOMY, Iva OUT;  Surgeon: Michael Boston, MD;  Location: [facility];  Service: General;  Laterality: N/A;  . STENT TO HEART     2 1997 AND 1 IN 1996  . TONSILLECTOMY    . XI ROBOTIC VAGOTOMY AND ANTRECTOMY N/A 09/25/2015   Procedure: XI ROBOTIC ANTERIOR AND POSTERIOR VAGOTOMY, BILROTH I  ANASTOMOSIS DOR FUNDIPLICATION, OMENTOPEXY  UPPER ENDOSCOPY;  Surgeon: Michael Boston, MD;  Location: [facility];  Service: General;  Laterality: N/A;    Prior to Admission medications   Medication Sig Start Date End Date Taking? Authorizing Provider  ALPRAZolam Duanne Moron) 0.5 MG tablet Take 0.5 mg by mouth at bedtime as needed for sleep or anxiety. 10/02/15  Yes Historical Provider, MD  aspirin  EC 81 MG tablet Take 81 mg by mouth every evening.    Yes Historical Provider, MD  atorvastatin (LIPITOR) 40 MG tablet Take 40 mg by mouth at bedtime.    Yes Historical Provider, MD  calcium-vitamin D (OSCAL WITH D) 500-200 MG-UNIT tablet Take 1 tablet by mouth daily  with breakfast.   Yes Historical Provider, MD  Cyanocobalamin (VITAMIN B-12) 1000 MCG SUBL Place 1 tablet under the tongue daily with breakfast.   Yes Historical Provider, MD  Docusate Calcium (STOOL SOFTENER PO) Take 1 capsule by mouth daily as needed (for constipation).    Yes Historical Provider, MD  Multiple Vitamin (MULTIVITAMIN) tablet Take 2 tablets by mouth daily with breakfast.    Yes Historical Provider, MD  nitroGLYCERIN (NITROSTAT) 0.4 MG SL tablet Place 0.4 mg under the tongue every 5 (five) minutes as needed for chest pain.   Yes Historical Provider, MD  pantoprazole (PROTONIX) 40 MG tablet Take 40 mg by mouth 2 (two) times daily. 06/02/15  Yes Historical Provider, MD  Polyvinyl Alcohol-Povidone (REFRESH OP) Place 2 drops into both eyes as needed (For dry eyes.).    Yes Historical Provider, MD  RESTASIS 0.05 % ophthalmic emulsion Place 1 drop into both eyes 2 (two) times daily.  04/16/13  Yes Historical Provider, MD  valsartan (DIOVAN) 80 MG tablet Take 80 mg by mouth daily with breakfast.    Yes Historical Provider, MD  metoCLOPramide (REGLAN) 5 MG tablet Take 1-2 tablets (5-10 mg total) by mouth every 6 (six) hours as needed for nausea or vomiting. Patient not taking: Reported on 11/24/2015 10/01/15   Michael Boston, MD  traMADol (ULTRAM) 50 MG tablet Take 1-2 tablets (50-100 mg total) by mouth every 6 (six) hours as needed for moderate pain or severe pain. Patient not taking: Reported on 11/24/2015 10/01/15   Michael Boston, MD    Current Facility-Administered Medications  Medication Dose Route Frequency Provider Last Rate Last Dose  . [name] KitchenTPN (CLINIMIX-E) Adult  83 mL/hr Intravenous Continuous TPN Michael Boston, MD 83 mL/hr at 12/13/15 1200     And  . fat emulsion 20 % infusion 240 mL  240 mL Intravenous Continuous TPN Michael Boston, MD 10 mL/hr at 12/13/15 1200 240 mL at 12/13/15 1200  . [name] KitchenTPN (CLINIMIX-E) Adult  83 mL/hr Intravenous Continuous TPN Thomes Lolling, [facility]       And  . fat  emulsion 20 % infusion 240 mL  240 mL Intravenous Continuous TPN Baltazar Najjar Lilliston, RPH      . 0.9 %  sodium chloride infusion   Intravenous Continuous Michael Boston, MD 10 mL/hr at 12/13/15 1200    . bisacodyl (DULCOLAX) suppository 10 mg  10 mg Rectal Daily Michael Boston, MD   10 mg at 12/12/15 6759  . chlorhexidine gluconate (MEDLINE KIT) (PERIDEX) 0.12 % solution 15 mL  15 mL Mouth Rinse BID Michael Boston, MD   15 mL at 12/13/15 0804  . cycloSPORINE (RESTASIS) 0.05 % ophthalmic emulsion 1 drop  1 drop Both Eyes BID Michael Boston, MD   1 drop at 12/13/15 0904  . famotidine (PEPCID) 40 mg in sodium chloride 0.9 % 50 mL IVPB  40 mg Intravenous Q12H Jackolyn Confer, MD   40 mg at 12/13/15 0903  . fentaNYL (SUBLIMAZE) injection 25-50 mcg  25-50 mcg Intravenous Q2H PRN Juanito Doom, MD   50 mcg at 12/13/15 1242  . haloperidol lactate (HALDOL) injection 1 mg  1 mg Intravenous Q12H Juanito Doom, MD  1 mg at 12/13/15 0903  . haloperidol lactate (HALDOL) injection 1-2 mg  1-2 mg Intravenous Q6H PRN Juanito Doom, MD   2 mg at 12/10/15 2045  . hydrALAZINE (APRESOLINE) injection 5 mg  5 mg Intravenous Q4H PRN Praveen Mannam, MD   5 mg at 12/12/15 1321  . insulin aspart (novoLOG) injection 0-9 Units  0-9 Units Subcutaneous Q4H Juanito Doom, MD   2 Units at 12/13/15 1209  . lip balm (CARMEX) ointment   Topical PRN Michael Boston, MD      . MEDLINE mouth rinse  15 mL Mouth Rinse QID Michael Boston, MD   15 mL at 12/13/15 1200  . nicotine (NICODERM CQ - dosed in mg/24 hours) patch 21 mg  21 mg Transdermal Daily Anders Simmonds, MD   21 mg at 12/13/15 0903  . nitroGLYCERIN (NITROSTAT) SL tablet 0.4 mg  0.4 mg Sublingual Q5 min PRN Michael Boston, MD      . ondansetron [facility]) injection 4 mg  4 mg Intravenous Q6H PRN Jerrye Beavers, PA-C   4 mg at 12/01/15 2001  . piperacillin-tazobactam (ZOSYN) IVPB 3.375 g  3.375 g Intravenous Q8H Minda Ditto, RPH   3.375 g at 12/13/15 0903  . polyvinyl alcohol  (LIQUIFILM TEARS) 1.4 % ophthalmic solution 2 drop  2 drop Both Eyes BID Michael Boston, MD   2 drop at 12/13/15 0904  . prochlorperazine (COMPAZINE) injection 5-10 mg  5-10 mg Intravenous Q4H PRN Michael Boston, MD      . sodium chloride flush (NS) 0.9 % injection 10-40 mL  10-40 mL Intracatheter PRN Arta Silence, MD   10 mL at 12/01/15 0510  . sodium chloride flush (NS) 0.9 % injection 10-40 mL  10-40 mL Intracatheter Q12H Michael Boston, MD   10 mL at 12/13/15 0905  . sodium chloride flush (NS) 0.9 % injection 10-40 mL  10-40 mL Intracatheter PRN Michael Boston, MD      . sucralfate (CARAFATE) 1 GM/10ML suspension 1 g  1 g Per Tube Q6H Michael Boston, MD   1 g at 12/13/15 1209    Allergies as of 11/24/2015 - Review Complete 11/24/2015  Allergen Reaction Noted  . Flomax [tamsulosin hcl] Other (See Comments) 06/18/2015  . Lisinopril Cough 04/24/2013  . Uroxatral [alfuzosin hcl er] Other (See Comments) 06/18/2015    Family History  Problem Relation Age of Onset  . Heart attack Mother     Social History   Social History  . Marital status: Married    Spouse name: N/A  . Number of children: N/A  . Years of education: N/A   Occupational History  . Not on file.   Social History Main Topics  . Smoking status: Former Smoker    Packs/day: 2.00    Years: 20.00    Types: Cigarettes    Quit date: 02/15/1995  . Smokeless tobacco: Never Used  . Alcohol use No  . Drug use: No  . Sexual activity: Not on file   Other Topics Concern  . Not on file   Social History Narrative  . No narrative on file    Review of Systems: Unable to verbalize due to tracheostomy  Physical Exam: Vital signs in last 24 hours: Temp:  [97.7 F (36.5 C)-99.7 F (37.6 C)] 99.3 F (37.4 C) (10/29 0800) Pulse Rate:  [82-106] 87 (10/29 1307) Resp:  [14-23] 17 (10/29 1307) BP: (105-169)/(36-87) 130/87 (10/29 1307) SpO2:  [93 %-100 %] 100 % (10/29 1307) FiO2 (%):  [  30 %] 30 % (10/29 1307) Weight:  [87.6 kg (193  lb 2 oz)] 87.6 kg (193 lb 2 oz) (10/29 0300) Last BM Date: 12/12/15 General:   Alert, edematous, can understand questions appropriately but not verbalize (tracheostomy) Head:  Normocephalic and atraumatic. Eyes:  Sclera clear, no icterus.   Conjunctiva pink. Ears:  Normal auditory acuity. Nose:  No deformity, discharge,  or lesions. Mouth:  No deformity or lesions.  Oropharynx pink & moist. Neck:  Tracheostomy in place; Supple; no masses or thyromegaly. Lungs:  Clear throughout to auscultation.   No wheezes, crackles, or rhonchi. No acute distress. Heart:  Regular rate and rhythm; no murmurs, clicks, rubs,  or gallops. Abdomen:  Soft, diffusely mildly tender but no peritonitis; JP drains RUQ x 1 and LUQ x 2; G-tube; J-tube inadvertently removed; No masses, hepatosplenomegaly or hernias noted. Diminished bowel sounds Msk:  Symmetrical without gross deformities. Normal posture. Pulses:  Normal pulses noted. Extremities:  Without clubbing; diffusely edematous Neurologic:  Alert and  oriented x4; can follow commands; diffusely weak Skin:  Intact without significant lesions or rashes. Psych:  Alert and cooperative. Depressed mood, flat affect   Lab Results:  Recent Labs  12/11/15 0405 12/12/15 2006 12/13/15 0447  WBC 16.4* 11.7* 11.3*  HGB 7.3* 6.3* 8.3*  HCT 21.4* 18.5* 25.0*  PLT 468* 519* 493*   BMET  Recent Labs  12/11/15 0405 12/12/15 0500 12/13/15 0522  NA 143 145 147*  K 3.4* 3.4* 3.5  CL 116* 114* 120*  CO2 22 24 23   GLUCOSE 143* 126* 156*  BUN 46* 42* 37*  CREATININE 1.51* 1.45* 1.15  CALCIUM 7.4* 7.8* 8.0*   LFT  Recent Labs  12/13/15 0522  PROT 5.3*  ALBUMIN 2.1*  AST 24  ALT 20  ALKPHOS 121  BILITOT 2.2*   PT/INR  Recent Labs  12/11/15 0405  LABPROT 15.7*  INR 1.25    Studies/Results: No results found.  Impression:  1.  Recurrent gastric outlet obstruction post Bilroth-I gastroduodenostomy, s/p surgical balloon dilatation of the  anastomotic stricture and gastric tube and jejunal tube placement. 2.  Post-operative abscess, s/p surgical JP drain placement. 3.  Ventilator wean problems, now with tracheostomy. 4.  Acute blood loss anemia. 5.  Dark blood per jejunal tube and hematochezia.  Unclear etiology.  Plan:  1.  Parenteral (TPN) nutrition. 2.  NPO. 3.  Antibiotics. 4.  Serial CBCs and transfusions as needed. 5.  IV famotidine and per G-Tube sucralfate. 6.  Dr. Barbie Banner reportedly to try and replace jejunostomy tube today. 7.  Endoscopy tentatively planned for tomorrow 1130 with anesthesia.  I have reviewed that our scope may not be able to traverse the gastroduodenal stricture, but that we will try.  If bleeding site not clarified on the endoscopy, might have to consider tagged RBC study as next step in management. 8.  Risks (bleeding, infection, bowel perforation that could require surgery, sedation-related changes in cardiopulmonary systems), benefits (identification and possible treatment of source of symptoms, exclusion of certain causes of symptoms), and alternatives (watchful waiting, radiographic imaging studies, empiric medical treatment) of upper endoscopy (EGD) were explained to patient/family in detail and patient wishes to proceed. 9.  [facility] will follow.   LOS: 18 days   Kham Zuckerman M  12/13/2015, 1:47 PM  Pager [phone] If no answer or after 5 PM call [phone]

## 2015-12-13 NOTE — Progress Notes (Signed)
Nutrition Follow-up  DOCUMENTATION CODES:   Severe malnutrition in context of acute illness/injury  INTERVENTION:   - Continue TPN per pharmacy. - Recommend Vital 1.2 @ 15 mL/hr when medically feasible (432 kcal, 27 grams of protein, and 292 mL free water). - RD will follow-up 10/30.  NUTRITION DIAGNOSIS:   Inadequate oral intake related to inability to eat as evidenced by NPO status.  Ongoing.  GOAL:   Patient will meet greater than or equal to 90% of their needs  Meeting with TPN.  MONITOR:   Vent status, Weight trends, Labs, I & O's, Other (Comment) (TPN regimen and ability to restart TF via J-tube)  ASSESSMENT:   75 year old male presenting for a post-operative visit. Note for "Post-Operative": Patient returns one month status post robotic resection.  Distal gastrectomy with Billroth I gastroduodenal reconstruction for chronic ulcer causing obstruction.  09/25/2015  10/29: Patient receiving TPN: Clinimix E 5/15 @ 83 ml/hr (maximum rate d/t TPN shortage) and ILE @ 10 ml/hr providing 1900 kcal (93% of needs) and 100g protein (90% of needs). Will continue at this rate today per Pharmacy.   Patient's weight has decreased another 10 lb since 10/27.  Staff currently in room working with patient as pt continues to have leakage from J-tube site. Pt is also having bloody stools. NGT output today: 350 ml, bilious.  Per surgery note, pt may need a study and reposition of J-tube today. GI may reevaluate.  Will continue to monitor for ability to wean off TPN and restart TF.  Patient is currently intubated on ventilator support via trach MV: 11.2 L/min Temp (24hrs), Avg:98.9 F (37.2 C), Min:97.7 F (36.5 C), Max:99.7 F (37.6 C)  Medications reviewed. Labs reviewed: CBGs:103-159 Elevated Na  10/26 - Consult for TPN received yesterday after full follow-up note.  - TF remains on hold.  - Pt with NGT with 100cc very dark drainage at time of RD visit this AM.  - Spoke with  RN at bedside who reports G-tube and J-tube to be set to gravity, but plan at this time for administration of contrast via J-tube for planned CT scan.  - Weight -1.3 kg from yesterday with Lasix.  - Pt currently receiving Clinimix E 5/15 @ 40 mL/hr with lipids held per ICU protocol.  - This regimen is providing 48 grams of protein, 682 kcal.  - Dr. Michaell CowingGross and Dr. Kendrick FriesMcQuaid currently talking with family at bedside. Will follow-up tomorrow for POC.  Based on national shortage of TPN, goal rate for TPN per pharmacy planned to be Clinimix E 5/15 @ 83 mL/hr (2 L/day) which will provide 100 grams of protein (91% minimum estimated protein need), 1414 kcal (74% estimated kcal need). Once 20% lipids @ 10 mL/hr are added, this will provide additional 480 kcal/day to meet 99.6% estimated kcal need.  Patient is currently intubated on ventilator support MV: 9.5 L/min Temp (24hrs), Avg:97.5 F (36.4 C), Min:96.4 F (35.8 C), Max:98.4 F (36.9 C) Drips:Levo @ 18 mcg/min, Heparin @ 1300 units/hr, Precedex @ 0.3 mcg/kg/hr.   Diet Order:  Diet NPO time specified .TPN (CLINIMIX-E) Adult .TPN (CLINIMIX-E) Adult  Skin:  Wound (see comment) (Abdominal incision)  Last BM:  10/29 -loose, bloody  Height:   Ht Readings from Last 1 Encounters:  12/08/15 6\' 2"  (1.88 m)    Weight:   Wt Readings from Last 1 Encounters:  12/13/15 193 lb 2 oz (87.6 kg)    Ideal Body Weight:  80.9 kg  BMI:  Body  mass index is 24.8 kg/m.  Estimated Nutritional Needs:   Kcal:  2045  Protein:  111-138 grams (1.2-1.5 grams/kg)  Fluid:  per MD/NP/Surgery  EDUCATION NEEDS:   No education needs identified at this time  Tilda FrancoLindsey Star Resler, MS, RD, LDN Pager: [phone] After Hours Pager: [phone]

## 2015-12-13 NOTE — Progress Notes (Signed)
Patient returned to full support due to increased RR; mild increased WOB.

## 2015-12-13 NOTE — Progress Notes (Signed)
[facility] NOTE   Pharmacy Consult for TPN  Patient Measurements: Height: 6' 2"  (188 cm) Weight: 193 lb 2 oz (87.6 kg) IBW/kg (Calculated) : 82.2 TPN AdjBW (KG): 97.2 Body mass index is 24.8 kg/m.  Insulin Requirements: 6 units Novolog in past 24 hours  Current Nutrition: NPO  IVF: NS at 10 ml/hr  Central access: PICC placed 10/12, pt pulled out 10/19. CVC in place since 10/19 TPN date: 10/12-10/19, resumed 10/25  ASSESSMENT                                                                                                          HPI: 4 yoM admitted 10/9 with new upper abdominal discomfort and bloating with concern for recurrent GOO due to chronic pyloric stricture. 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  Pharmacy consulted to start TPN for gastric ileus and obstruction 10/12. Patient underwent G and J tube placement this admission. On 10/19, patient pulled out PICC line. He also had to go back to OR after he removed his G tube accidentally, leading to peritonitis, intubation, and septic shock. Tube feed rate slowly advanced by CCS, but today patient noted to have dark brown output around J tube site, and tube feeds held. Pharmacy asked to resume TPN.   Today, 12/13/15:   Glucose - No hx DM noted. CBGs now mostly at goal <150 (range 103-156).  Electrolytes - Cl- high at 120 (stable), all other lytes including Corrected Ca WNL  Renal - AKI, I/O: 2385.02/2718  LFTs - AST/ALT, Alk Phos, Tbili WNL  TGs -76 (10/13), 97 (10/16), 214 (10/26)  Prealbumin - 17.8 (10/11), 12.8 (10/13), 11 (10/16), 12.5 (10/18), < 5 (10/23), 6.4 (10/26)  Bleeding overnight at J-tube site requiring PRBCs  Noted RD's recommendation for Vital 1.2 @ 19m/hr when medically feasible  NUTRITIONAL GOALS                                                                                             RD recs (as of  10/27): [id number]:111-138 grams (1.2-1.5 grams/kg)  Clinimix 5/15 at a goal rate of 987mhr would provide 108g/day protein, 2013Kcal/day.  **There is currently a naPsychologist, prison and probation servicesf Clinimix solution. No 2L bags of Clinimix E 5/20 currently available. To conserve supply, will consider goal rate of Clinimix 5/15 at 83 ml/hr + 20% fat emulsion at 1029mr, which will keep total volume at 2L rather than increasing use to 3L.   PLAN  At 1800 today:  Continue Clinimix E 5/15 to 83 ml/hr (max rate).   20% fat emulsion at 10 ml/hr.  TPN to contain standard multivitamins and trace elements.  IVF per MD.  Continue sensitive SSI q4h.   TPN lab panels on Mondays & Thursdays.    F/u daily.  Start TF per RD recommendation as soon as medically feasible   Netta Cedars, PharmD, BCPS Pager: [phone] 12/13/2015@10 :25 AM

## 2015-12-13 NOTE — Progress Notes (Signed)
Patient transported to and from IR without complication. Patient resting room. VSS. RT will continue to monitor patient.

## 2015-12-13 NOTE — Progress Notes (Signed)
General Surgery [facility]  Called by nurse to bedside for continued drainage/leakage around jejunostomy tube.  Tube is at approx 10cm at the skin.  May have been pulled back or dislodged by the patient or inadvertantly.  Will contact radiology and request them to study and possibly reposition jejunostomy tube today.  Patient continues to have dark bloody stools.  Hgb improved this AM after transfusion.  Checking CBC and coags at 2 PM today.  NG output bilious.  May need re-evaluation by gastroenterology - Dr. Dulce Sellarutlaw.  I will give him a call.  Discussed both issues at bedside with nurse and familly.  tmg  Velora Hecklerodd M. Josean Lycan, MD, [facility] Hospital[facility]ACS [facility] Office: [phone]

## 2015-12-13 NOTE — Progress Notes (Signed)
General Surgery [facility]  12/13/2015  Assessment & Plan: POD#9 s/p patching at jejunum & resecuring Gtube & massive washout             IR place 2 perc drains LUQ             Gtube & Jtube on drainage - dark blood noted in Jtube drainage - carafate & PPI started             IV Zosyn Upper GI bleeding  Bloody drainage started from Jtube with decrease in Hgb  Transfused 2U PRBC with appropriate Hgb rise this AM  On carafate & PPI  Repeat labs this afternoon, check coags Respiratory failure  Vent per CCM  Failed weaning trial yesterday        Velora Heckler, MD, [facility]       Office: [phone]    Subjective: Patient awake and responsive, on vent.  Denies abdominal pain.  Objective: Vital signs in last 24 hours: Temp:  [97.7 F (36.5 C)-99.7 F (37.6 C)] 97.7 F (36.5 C) (10/29 0400) Pulse Rate:  [82-106] 82 (10/29 0328) Resp:  [14-32] 20 (10/29 0600) BP: (105-194)/(36-81) 158/63 (10/29 0600) SpO2:  [93 %-100 %] 100 % (10/29 0600) FiO2 (%):  [30 %] 30 % (10/29 0328) Weight:  [87.6 kg (193 lb 2 oz)] 87.6 kg (193 lb 2 oz) (10/29 0300) Last BM Date: 12/12/15  Intake/Output from previous day: 10/28 0701 - 10/29 0700 In: 3215.1 [I.V.:2407.8; Blood:33.3; IV Piggyback:654] Out: 3220 [Urine:1850; Emesis/NG output:1100; Drains:270] Intake/Output this shift: Total I/O In: 20 [Other:20] Out: -   Physical Exam: HEENT - sclerae clear, mucous membranes moist Neck - trach site clear Abdomen - soft; dark drainage around Jtube appliance - dressing changed & tube repositioned; thin dark serosanguinous output from Jtube, bilious from NG and Gtube; perc drains LUQ with thin serous output  Lab Results:   Recent Labs  12/12/15 2006 12/13/15 0447  WBC 11.7* 11.3*  HGB 6.3* 8.3*  HCT 18.5* 25.0*  PLT 519* 493*   BMET  Recent Labs  12/12/15 0500 12/13/15 0522  NA 145 147*  K 3.4* 3.5  CL 114* 120*  CO2 24 23   GLUCOSE 126* 156*  BUN 42* 37*  CREATININE 1.45* 1.15  CALCIUM 7.8* 8.0*   PT/INR  Recent Labs  12/11/15 0405  LABPROT 15.7*  INR 1.25   Comprehensive Metabolic Panel:    Component Value Date/Time   NA 147 (H) 12/13/2015 0522   NA 145 12/12/2015 0500   K 3.5 12/13/2015 0522   K 3.4 (L) 12/12/2015 0500   CL 120 (H) 12/13/2015 0522   CL 114 (H) 12/12/2015 0500   CO2 23 12/13/2015 0522   CO2 24 12/12/2015 0500   BUN 37 (H) 12/13/2015 0522   BUN 42 (H) 12/12/2015 0500   CREATININE 1.15 12/13/2015 0522   CREATININE 1.45 (H) 12/12/2015 0500   GLUCOSE 156 (H) 12/13/2015 0522   GLUCOSE 126 (H) 12/12/2015 0500   CALCIUM 8.0 (L) 12/13/2015 0522   CALCIUM 7.8 (L) 12/12/2015 0500   AST 24 12/13/2015 0522   AST 25 12/10/2015 0418   ALT 20 12/13/2015 0522   ALT 20 12/10/2015 0418   ALKPHOS 121 12/13/2015 0522   ALKPHOS 117 12/10/2015 0418   BILITOT 2.2 (H) 12/13/2015 0522   BILITOT 0.6 12/10/2015 0418   PROT 5.3 (L) 12/13/2015 0522   PROT 4.5 (L)  12/10/2015 0418   ALBUMIN 2.1 (L) 12/13/2015 0522   ALBUMIN 1.5 (L) 12/10/2015 0418    Studies/Results: Dg Chest Port 1 View  Result Date: 12/11/2015 CLINICAL DATA:  Tracheostomy tube EXAM: PORTABLE CHEST 1 VIEW COMPARISON:  400 hour FINDINGS: The endotracheal tube has been exchanged for a Shiley tracheostomy tube. Tip is 6.7 cm from the carina. Lungs are hyperaerated. Hazy opacity at the left base has improved. Normal heart size. No pneumothorax. Left subclavian central venous catheter and left PICC are stable. IMPRESSION: Tracheostomy tube as described. Improved hazy airspace disease at the left base. Electronically Signed   By: Jolaine ClickArthur  Hoss [name].D.   On: 12/11/2015 09:37   Ct Image Guided Drainage By Percutaneous Catheter  Result Date: 12/11/2015 CLINICAL DATA:  Peritonitis and separate fluid collections adjacent to the greater curvature the stomach and lateral to the spleen in the left upper quadrant. The patient presents for  percutaneous drainage of these collections. EXAM: CT GUIDED CATHETER DRAINAGE OF PERITONEAL ABSCESS X 2 ANESTHESIA/SEDATION: 2.0 mg IV Versed 50 mcg IV Fentanyl Total Moderate Sedation Time:  36 minutes The patient's level of consciousness and physiologic status were continuously monitored during the procedure by Radiology nursing. PROCEDURE: The procedure, risks, benefits, and alternatives were explained to the patient's wife. Questions regarding the procedure were encouraged and answered. The patient's wife understands and consents to the procedure. A time out was performed prior to initiating the procedure. The left abdominal wall was prepped with chlorhexidine in a sterile fashion, and a sterile drape was applied covering the operative field. A sterile gown and sterile gloves were used for the procedure. Local anesthesia was provided with 1% Lidocaine. CT was performed through the upper abdomen in a supine position. After localizing sites for percutaneous drain placement, 2 separate 18 gauge trocar needles were advanced under CT guidance into separate collections. Fluid aspiration was performed and a sample sent for culture analysis. Guidewires were advanced and trocar needles removed. The 2 separate tracts were dilated to 10 [place] and over guidewires, separate 10 French percutaneous drainage catheters advance. Catheter positioning was confirmed by CT. The catheters were connected to suction bulbs. The catheters were secured at the skin with Prolene retention sutures and StatLock devices. COMPLICATIONS: None FINDINGS: Both collections yielded clear fluid. Fluid adjacent to the greater curvature of the stomach was slightly blood tinged. Fluid adjacent to the spleen was yellowish in color. Separate 10 French drains were placed in both collections and are draining well after placement. IMPRESSION: CT-guided percutaneous drainage of separate left upper quadrant peritoneal collections along the greater curvature of  the stomach and lateral to the spleen. 10 French drains were placed and attached to suction bulb drainage. Given similar nature of fluid from both collections, samples were combined and sent for a single abscess culture analysis. Electronically Signed   By: Irish LackGlenn  Yamagata [name].D.   On: 12/11/2015 13:44   Ct Image Guided Drainage By Percutaneous Catheter  Result Date: 12/11/2015 CLINICAL DATA:  Peritonitis and separate fluid collections adjacent to the greater curvature the stomach and lateral to the spleen in the left upper quadrant. The patient presents for percutaneous drainage of these collections. EXAM: CT GUIDED CATHETER DRAINAGE OF PERITONEAL ABSCESS X 2 ANESTHESIA/SEDATION: 2.0 mg IV Versed 50 mcg IV Fentanyl Total Moderate Sedation Time:  36 minutes The patient's level of consciousness and physiologic status were continuously monitored during the procedure by Radiology nursing. PROCEDURE: The procedure, risks, benefits, and alternatives were explained to the patient's wife. Questions regarding  the procedure were encouraged and answered. The patient's wife understands and consents to the procedure. A time out was performed prior to initiating the procedure. The left abdominal wall was prepped with chlorhexidine in a sterile fashion, and a sterile drape was applied covering the operative field. A sterile gown and sterile gloves were used for the procedure. Local anesthesia was provided with 1% Lidocaine. CT was performed through the upper abdomen in a supine position. After localizing sites for percutaneous drain placement, 2 separate 18 gauge trocar needles were advanced under CT guidance into separate collections. Fluid aspiration was performed and a sample sent for culture analysis. Guidewires were advanced and trocar needles removed. The 2 separate tracts were dilated to 10 [place] and over guidewires, separate 10 French percutaneous drainage catheters advance. Catheter positioning was confirmed by CT.  The catheters were connected to suction bulbs. The catheters were secured at the skin with Prolene retention sutures and StatLock devices. COMPLICATIONS: None FINDINGS: Both collections yielded clear fluid. Fluid adjacent to the greater curvature of the stomach was slightly blood tinged. Fluid adjacent to the spleen was yellowish in color. Separate 10 French drains were placed in both collections and are draining well after placement. IMPRESSION: CT-guided percutaneous drainage of separate left upper quadrant peritoneal collections along the greater curvature of the stomach and lateral to the spleen. 10 French drains were placed and attached to suction bulb drainage. Given similar nature of fluid from both collections, samples were combined and sent for a single abscess culture analysis. Electronically Signed   By: Irish LackGlenn  Yamagata [name].D.   On: 12/11/2015 13:44      [name] 12/13/2015  Patient ID: [name], male   DOB: 02-13-1941, 75 y.o.   MRN: [id number]

## 2015-12-14 ENCOUNTER — Encounter (HOSPITAL_COMMUNITY)
Admission: AD | Disposition: A | Discharge status: Skilled Nursing Facility | Payer: Self-pay | Source: Ambulatory Visit | Attending: Surgery

## 2015-12-14 ENCOUNTER — Encounter (HOSPITAL_COMMUNITY): Payer: Self-pay

## 2015-12-14 HISTORY — PX: ESOPHAGOGASTRODUODENOSCOPY: SHX5428

## 2015-12-14 LAB — TYPE AND SCREEN
ABO/RH(D): A POS
ANTIBODY SCREEN: NEGATIVE
UNIT DIVISION: 0
Unit division: 0
Unit division: 0
Unit division: 0

## 2015-12-14 LAB — COMPREHENSIVE METABOLIC PANEL
ALBUMIN: 1.9 g/dL — AB (ref 3.5–5.0)
ALT: 19 U/L (ref 17–63)
AST: 23 U/L (ref 15–41)
Alkaline Phosphatase: 118 U/L (ref 38–126)
Anion gap: 4 — ABNORMAL LOW (ref 5–15)
BILIRUBIN TOTAL: 2 mg/dL — AB (ref 0.3–1.2)
BUN: 37 mg/dL — AB (ref 6–20)
CO2: 25 mmol/L (ref 22–32)
Calcium: 8.1 mg/dL — ABNORMAL LOW (ref 8.9–10.3)
Chloride: 120 mmol/L — ABNORMAL HIGH (ref 101–111)
Creatinine, Ser: 1.07 mg/dL (ref 0.61–1.24)
GFR calc Af Amer: >60 mL/min (ref >60)
GFR calc non Af Amer: >60 mL/min (ref >60)
GLUCOSE: 148 mg/dL — AB (ref 65–99)
POTASSIUM: 3.4 mmol/L — AB (ref 3.5–5.1)
Sodium: 149 mmol/L — ABNORMAL HIGH (ref 135–145)
TOTAL PROTEIN: 5.4 g/dL — AB (ref 6.5–8.1)

## 2015-12-14 LAB — CBC
HEMATOCRIT: 31 % — AB (ref 39.0–52.0)
HEMOGLOBIN: 10.3 g/dL — AB (ref 13.0–17.0)
MCH: 29.5 pg (ref 26.0–34.0)
MCHC: 33.2 g/dL (ref 30.0–36.0)
MCV: 88.8 fL (ref 78.0–100.0)
Platelets: 516 10*3/uL — ABNORMAL HIGH (ref 150–400)
RBC: 3.49 MIL/uL — AB (ref 4.22–5.81)
RDW: 16.3 % — AB (ref 11.5–15.5)
WBC: 7.7 10*3/uL (ref 4.0–10.5)

## 2015-12-14 LAB — DIFFERENTIAL
BASOS ABS: 0 10*3/uL (ref 0.0–0.1)
BASOS PCT: 0 %
Eosinophils Absolute: 0.1 10*3/uL (ref 0.0–0.7)
Eosinophils Relative: 1 %
LYMPHS ABS: 0.9 10*3/uL (ref 0.7–4.0)
LYMPHS PCT: 11 %
MONO ABS: 1.6 10*3/uL — AB (ref 0.1–1.0)
MONOS PCT: 21 %
NEUTROS ABS: 5.1 10*3/uL (ref 1.7–7.7)
Neutrophils Relative %: 67 %

## 2015-12-14 LAB — GLUCOSE, CAPILLARY
GLUCOSE-CAPILLARY: 132 mg/dL — AB (ref 65–99)
GLUCOSE-CAPILLARY: 133 mg/dL — AB (ref 65–99)
Glucose-Capillary: 149 mg/dL — ABNORMAL HIGH (ref 65–99)
Glucose-Capillary: 145 mg/dL — ABNORMAL HIGH (ref 65–99)
Glucose-Capillary: 128 mg/dL — ABNORMAL HIGH (ref 65–99)

## 2015-12-14 LAB — MAGNESIUM: Magnesium: 2.1 mg/dL (ref 1.7–2.4)

## 2015-12-14 LAB — PREALBUMIN: Prealbumin: 9.2 mg/dL — ABNORMAL LOW (ref 18–38)

## 2015-12-14 LAB — TRIGLYCERIDES: Triglycerides: 194 mg/dL — ABNORMAL HIGH (ref <150)

## 2015-12-14 LAB — PHOSPHORUS: PHOSPHORUS: 3.2 mg/dL (ref 2.5–4.6)

## 2015-12-14 SURGERY — EGD (ESOPHAGOGASTRODUODENOSCOPY)
Anesthesia: Moderate Sedation

## 2015-12-14 MED ORDER — FENTANYL CITRATE (PF) 100 MCG/2ML IJ SOLN
INTRAMUSCULAR | Status: DC | PRN
Start: 2015-12-14 — End: 2015-12-14
  Administered 2015-12-14 (×2): 25 ug via INTRAVENOUS

## 2015-12-14 MED ORDER — FENTANYL CITRATE (PF) 100 MCG/2ML IJ SOLN
INTRAMUSCULAR | Status: AC
  Filled 2015-12-14: qty 2

## 2015-12-14 MED ORDER — ZINC OXIDE 20 % EX OINT
TOPICAL_OINTMENT | CUTANEOUS | Status: DC | PRN
  Administered 2015-12-15 – 2015-12-17 (×5): via TOPICAL
  Filled 2015-12-14: qty 28.35

## 2015-12-14 MED ORDER — TRACE MINERALS CR-CU-MN-SE-ZN 10-1000-500-60 MCG/ML IV SOLN
INTRAVENOUS | Status: AC
  Administered 2015-12-14: 17:00:00 via INTRAVENOUS
  Filled 2015-12-14 (×2): qty 1992

## 2015-12-14 MED ORDER — BARRIER CREAM NON-SPECIFIED
1.0000 "application " | TOPICAL_CREAM | TOPICAL | Status: DC | PRN
  Filled 2015-12-14: qty 1

## 2015-12-14 MED ORDER — MIDAZOLAM HCL 5 MG/ML IJ SOLN
INTRAMUSCULAR | Status: AC
  Filled 2015-12-14: qty 2

## 2015-12-14 MED ORDER — POTASSIUM CHLORIDE 10 MEQ/100ML IV SOLN
10.0000 meq | INTRAVENOUS | Status: AC
  Administered 2015-12-14 (×3): 10 meq via INTRAVENOUS
  Filled 2015-12-14 (×3): qty 100

## 2015-12-14 MED ORDER — VITAL HIGH PROTEIN PO LIQD
1000.0000 mL | ORAL | Status: DC
  Filled 2015-12-14: qty 1000

## 2015-12-14 MED ORDER — MIDAZOLAM HCL 10 MG/2ML IJ SOLN
INTRAMUSCULAR | Status: DC | PRN
  Administered 2015-12-14 (×2): 2 mg via INTRAVENOUS

## 2015-12-14 MED ORDER — FAT EMULSION 20 % IV EMUL
250.0000 mL | INTRAVENOUS | Status: AC
  Administered 2015-12-14: 250 mL via INTRAVENOUS
  Filled 2015-12-14 (×2): qty 250

## 2015-12-14 MED ORDER — ZINC OXIDE 40 % EX OINT
TOPICAL_OINTMENT | Freq: Two times a day (BID) | CUTANEOUS | Status: DC
  Administered 2015-12-14: 1 via TOPICAL
  Administered 2015-12-14 – 2015-12-21 (×14): via TOPICAL
  Administered 2015-12-22: 1 via TOPICAL
  Administered 2015-12-22: 10:00:00 via TOPICAL
  Administered 2015-12-23: 1 via TOPICAL
  Administered 2015-12-23 – 2015-12-24 (×3): via TOPICAL
  Administered 2015-12-25: 1 via TOPICAL
  Administered 2015-12-25: 10:00:00 via TOPICAL
  Administered 2015-12-26: 1 via TOPICAL
  Administered 2015-12-27 – 2015-12-29 (×6): via TOPICAL
  Administered 2015-12-29: 1 via TOPICAL
  Administered 2015-12-30: 11:00:00 via TOPICAL
  Filled 2015-12-14 (×3): qty 57
  Filled 2015-12-14: qty 114

## 2015-12-14 MED ORDER — VITAL HIGH PROTEIN PO LIQD: 1000.0000 mL | ORAL | Status: DC

## 2015-12-14 MED ORDER — VITAL AF 1.2 CAL PO LIQD
1000.0000 mL | ORAL | Status: DC
  Administered 2015-12-15: 1000 mL
  Filled 2015-12-14 (×3): qty 1000

## 2015-12-14 NOTE — Progress Notes (Signed)
Ardyth Galalph T Regula 11:16 AM  Subjective: Patient's hospital computer chart was reviewed and his case discussed with my partner Dr. Dulce Sellaroutlaw and I will see him and examined him before his endoscopy  Objective: Vital signs stable afebrile no acute distress back on the ventilator for this procedure exam please see preassessment evaluation labs reviewed hemoglobin stable BUN stable  Assessment: Multiple medical problems including gastric outlet obstruction and GI blood loss  Plan: Okay to proceed with endoscopy at the bedside with further workup and plans pending those findings  [facility] HospitalMAGOD,Karsen Nakanishi E  Pager [phone] After 5PM or if no answer call [phone]

## 2015-12-14 NOTE — Progress Notes (Addendum)
[place]  [address] [place], [place] [address] Phone: [phone] FAX: [phone]   [name] [id number] 09/06/1940  CARE TEAM:  PCP: Irven Shelling, MD  Outpatient Care Team: Patient Care Team: Lavone Orn, MD as PCP - General (Internal Medicine) Michael Boston, MD as Consulting Physician (General Surgery) Arta Silence, MD as Consulting Physician (Gastroenterology)  Inpatient Treatment Team: Treatment Team: Attending Provider: Michael Boston, MD; Consulting Physician: Arta Silence, MD; Technician: Sueanne Margarita, NT; Consulting Physician: Md Pccm, MD; Rounding Team: Md Pccm, MD; Registered Nurse: Lynnell Dike, RN; Chaplain: Hadley Pen, Bonney Roussel; Rounding Team: [facility], MD; Respiratory Therapist: Nelly Laurence, RRT; Registered Nurse: Kennyth Lose, RN; Physical Therapy Assistant: Diego Cory, PTA  SURGERY 12/03/2015  POST-OPERATIVE DIAGNOSIS:    Peritonitis Jejunal disruption Gastric leak  PROCEDURE:    LAPAROSCOPY DIAGNOSTIC OMENTOPEXY of jejunal disruption [place] OUT x 21L with drains placement  SURGEON:  Michael Boston, MD   Post-Op 12/01/2015  POST-OPERATIVE DIAGNOSIS:    RECURRENT GASTRIC OUTLET OBSTRUCTION DUE to significant edema at gastroduodenal anastomosis. GASTRITIS Failure to thrive with malnutrition  PROCEDURE:   LAPAROSCOPIC PLACEMENT OF FEEDING JEJUNOSTOMY AND GASTROSTOMY TUBEs ESOPHAGOGASTRODUODENOSCOPY (EGD) BALLOON DILATION OF DUODENAL STRICTURE Gastric biopsies  Closure of gastrotomy  SURGEON:  Michael Boston, MD  Prior surgery 09/25/2015  POST-OPERATIVE DIAGNOSIS:  Partial gastric outlet obstruction due to chronic pyloric stricture  PROCEDURE:   XI ROBOTIC distal gastrectomy BILROTH I  ANASTOMOSIS  ANTERIOR AND POSTERIOR VAGOTOMY DOR (Anterior 423 degree) FUNDIPLICATION OMENTOPEXY UPPER ENDOSCOPY  SURGEON:  Michael Boston,  MD   Problem List:   Principal Problem:   Bile peritonitis due to gastric tube dislodgement Active Problems:   Essential hypertension, benign   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 09/25/2015   Hyperlipidemia   Anxiety state   Gastric outlet obstruction   Gastric Ileus    Encephalopathy acute   Endotracheally intubated   On mechanically assisted ventilation (HCC)   Protein-calorie malnutrition, severe   Acute respiratory failure with hypoxemia (HCC)   Tobacco abuse   Ischemic right index finger at tip   Gastritis   Gastric tube present (LUQ)   Jejunostomy tube present (LLQ)   Pneumatosis of intestines    Assessment/Plan:  GUARDED s/p patching at jejunum & resecuring Gtube & massive washout  -Pneumatosis & jejunal bowel wall thickening.  Lactate <2.   No SMA thrombosis.  No evidence of any embolic disease in the mesentery.  Off pressors.  WBC normalizing.  Suspect this is source of GL bleeding (NGT & Gtube bilious, not bloody.  J tube output w old blood.  Jejunostomy tube replaced.  No perforation.  No extravasation of contrast around jejunostomy tube.  Had BMs & lower NGT/Gtube/Jtube output = doubt complete obstruction.   2 collections in the abdomen drained seem serous - mostly likely can d/c perisplenic later this week if Cx neg & output<1m/day x 48hr  If EGD negative for SB necrosis, try very low-dose elemental tube feeds 14mhour as there is some data that is protective in pneumatosis intestinalis.  Off pressors now.    TC trials to get off vent.  More calm a hopeful sign, but guarded w pulling out another tube 2 days ago  I would not operate on this patient unless he goes under septic shock with high lactate, severe distention and peritonitis.  Extremely hostile abdomen with high risk of enterocutaneous fistula and breakdown of any small bowel  resection.  Any bowel obstruction would be related to inflammation and adhesions and an early  inflammatory stage.  Would manage conservatively anyway.  Having BMs = not complete SBO.  Continue proximal decompression with orogastric active LIWS suction, gastric tube to gravity (to keep pressure down but not actively suck around Gtube skin leak), jejunostomy tube back to gravity.  Diuresis as tolerated to help with secretions/vent.    -IV nutrition TNA   -Inflammatory narrowing at gastroduodenal Bilroth I anastomosis.  The fact that there is bilious reflux into the stomach argues against a complete obstruction.  Skin care.  H2B for gastriris.  F/u Bx's for Hpylori neg.  Carafate via Gtube as tolerated.  Consider PPI IV (shortage = has been held).  GI wants to rescope  Diagnosis Stomach, biopsy - CHRONIC FOCALLY ACTIVE GASTRITIS. - THERE IS NO EVIDENCE OF HELICOBACTER PYLORI, DYSPLASIA OR MALIGNANCY.  -Zosyn/antifungal IV x 10 days minimum.  If EGD ok, agree to wean off    -Sedation/Anxiolysis - challenge with pt that goes from sleeping to pulling at lines/tubes.  mits for now.   Consider transitioning from Xanax and benzos and utilizing haldol/zyprexa/seroquel  Sundowning precautions.  Back on Precidex.  SUNDOWNING PRECAUTIONS   VTE prophylaxis - SCDs  -lowK - replaced  -brachial artery embolus to right index finger resolved.   D/w Dr. Trula Slade with vascular surgery.   Duplex study argues against any more brachial clot with good blood supply.  Can come off heparin    Nicotene patch - apparently dips (pt denied to to me in past but  wife and daughter do confirm that he actually uses it moderately.  Patient's wife hopeful that a lot of this is nicotine withdrawal.  While I am skeptical, does not hurt to use that.  Certainly that can make gastritis/ulcer problems worse.    Disposition: He will definitetly require skilled care facility with rehabilitation capacity upon discharge from the hospital.  I suspect it's going to take at least 2 weeks if not several before he'll be ready for that  transition.  His daughter expressed understanding and appreciation   I discussed operative findings, updated the patient's status, discussed probable steps to recovery, and gave postoperative recommendations to the patient and family.  ICU RNs.  Discussion with critical-care nursing staff.  Recommendations were made.  Questions were answered.  They expressed understanding & appreciation.    [name], M.D., F.A.C.S. Gastrointestinal and Minimally Invasive Surgery [facility] [address] [place], [place] [address] [phone] Main / Paging    12/14/2015   Subjective:  More calm last 24 hours Off pressors.  Drainage around gastrostomy tube.   Min vent settings  No leaking around Jtube replacement 12Fr ICU nurses outside room Daughter & brother-in-law in room    Objective:  Vital signs:  Vitals:   12/14/15 0410 12/14/15 0500 12/14/15 0533 12/14/15 0600  BP:    (!) 155/66  Pulse: 94     Resp: 20   17  Temp:   99.5 F (37.5 C)   TempSrc:   Oral   SpO2: 100%   100%  Weight:  87.5 kg (192 lb 14.4 oz)    Height:        Last BM Date: 12/12/15  Intake/Output   Yesterday:  10/29 0701 - 10/30 0700 In: 2561.3 [I.V.:2141.3; NG/GT:240] Out: 1735 [Urine:1125; Emesis/NG output:300; Drains:310] This shift:  No intake/output data recorded.  Bowel function:  Flatus: YES  BM:  YES - bloody  Drain: Gtube thick dark old blood  Blake drains all serous - no bile tinge Jtube old blood & bilious Gtube bilious NGT bilious    Physical Exam:  General: Pt awake/alert.  Following commands but occ confused.   mild acute distress at best.   Eyes: PERRL, normal EOM.  Sclera clear.  No icterus Neuro: CN II-XII intact w/o focal sensory/motor deficits. Lymph: No head/neck/groin lymphadenopathy Psych:  No delerium/psychosis/paranoia HENT: Normocephalic, Mucus membranes moist.  No thrush. ETT in place.  NGT in place thin bilious Neck:  Supple, No tracheal deviation Chest: CTAB. Chest wall pain w good excursion CV:  Pulses intact.  Regular rhythm MS: Normal AROM mjr joints.  No obvious deformity Abdomen:  G tube LUQ.  Rash around skin with scant drainage, some fullness around the region of the gastrostomy tube but no fluctuance.  J tube in LLQ - skin clean  Softer.  Nondistended.  Tenderness at G tube site & LLQ.  No pain RLQ/RUQ/epigastric. GU:  Foley in place.  NEMG Ext:  SCDs BLE.  No mjr edema.  Right index finger unchanged - distal pad & nail bed with cyanosis.  Good cap refill to nail bed  Skin: 1+ anasarca.  No other petechiae / purpura  Results:   Diagnosis Stomach, biopsy - CHRONIC FOCALLY ACTIVE GASTRITIS. - THERE IS NO EVIDENCE OF HELICOBACTER PYLORI, DYSPLASIA OR MALIGNANCY. - SEE COMMENT. Microscopic Comment A Warthin-Starry stain is negative for the presence of Helicobacter pylori organisms. (JBK:gt, 12/04/15) Enid Cutter MD Pathologist, Electronic Signature (Case signed 12/04/2015) Specimen [name] and Clinical Information Specimen(s) Obtained: Stomach, biopsy Specimen Clinical  Labs: Results for orders placed or performed during the hospital encounter of 11/24/15 (from the past 48 hour(s))  Glucose, capillary     Status: Abnormal   Collection Time: 12/12/15  8:18 AM  Result Value Ref Range   Glucose-Capillary 132 (H) 65 - 99 mg/dL   Comment 1 Notify RN    Comment 2 Document in Chart   Glucose, capillary     Status: Abnormal   Collection Time: 12/12/15 12:36 PM  Result Value Ref Range   Glucose-Capillary 153 (H) 65 - 99 mg/dL   Comment 1 Notify RN    Comment 2 Document in Chart   Glucose, capillary     Status: Abnormal   Collection Time: 12/12/15  4:48 PM  Result Value Ref Range   Glucose-Capillary 140 (H) 65 - 99 mg/dL  CBC     Status: Abnormal   Collection Time: 12/12/15  8:06 PM  Result Value Ref Range   WBC 11.7 (H) 4.0 - 10.5 K/uL   RBC 2.04 (L) 4.22 - 5.81 MIL/uL   Hemoglobin 6.3  (LL) 13.0 - 17.0 g/dL    Comment: CRITICAL RESULT CALLED TO, READ BACK BY AND VERIFIED WITH: MADDOX,B RN AT 2024 ON 10.28.17 BY EPPERSON,S    HCT 18.5 (L) 39.0 - 52.0 %   MCV 90.7 78.0 - 100.0 fL   MCH 30.9 26.0 - 34.0 pg   MCHC 34.1 30.0 - 36.0 g/dL   RDW 14.5 11.5 - 15.5 %   Platelets 519 (H) 150 - 400 K/uL  Glucose, capillary     Status: Abnormal   Collection Time: 12/12/15  8:22 PM  Result Value Ref Range   Glucose-Capillary 153 (H) 65 - 99 mg/dL  Prepare RBC     Status: None   Collection Time: 12/12/15  9:00 PM  Result Value Ref Range   Order Confirmation  ORDER PROCESSED BY BLOOD BANK   Glucose, capillary     Status: Abnormal   Collection Time: 12/12/15 11:30 PM  Result Value Ref Range   Glucose-Capillary 140 (H) 65 - 99 mg/dL  Glucose, capillary     Status: Abnormal   Collection Time: 12/13/15  4:03 AM  Result Value Ref Range   Glucose-Capillary 151 (H) 65 - 99 mg/dL  CBC     Status: Abnormal   Collection Time: 12/13/15  4:47 AM  Result Value Ref Range   WBC 11.3 (H) 4.0 - 10.5 K/uL   RBC 2.74 (L) 4.22 - 5.81 MIL/uL   Hemoglobin 8.3 (L) 13.0 - 17.0 g/dL    Comment: DELTA CHECK NOTED REPEATED TO VERIFY POST TRANSFUSION SPECIMEN    HCT 25.0 (L) 39.0 - 52.0 %   MCV 91.2 78.0 - 100.0 fL   MCH 30.3 26.0 - 34.0 pg   MCHC 33.2 30.0 - 36.0 g/dL   RDW 16.1 (H) 11.5 - 15.5 %   Platelets 493 (H) 150 - 400 K/uL  Comprehensive metabolic panel     Status: Abnormal   Collection Time: 12/13/15  5:22 AM  Result Value Ref Range   Sodium 147 (H) 135 - 145 mmol/L   Potassium 3.5 3.5 - 5.1 mmol/L   Chloride 120 (H) 101 - 111 mmol/L   CO2 23 22 - 32 mmol/L   Glucose, Bld 156 (H) 65 - 99 mg/dL   BUN 37 (H) 6 - 20 mg/dL   Creatinine, Ser 1.15 0.61 - 1.24 mg/dL   Calcium 8.0 (L) 8.9 - 10.3 mg/dL   Total Protein 5.3 (L) 6.5 - 8.1 g/dL   Albumin 2.1 (L) 3.5 - 5.0 g/dL   AST 24 15 - 41 U/L   ALT 20 17 - 63 U/L   Alkaline Phosphatase 121 38 - 126 U/L   Total Bilirubin 2.2 (H) 0.3 -  1.2 mg/dL   GFR calc non Af Amer 60 (L) >60 mL/min   GFR calc Af Amer >60 >60 mL/min    Comment: (NOTE) The eGFR has been calculated using the CKD EPI equation. This calculation has not been validated in all clinical situations. eGFR's persistently <60 mL/min signify possible Chronic Kidney Disease.    Anion gap 4 (L) 5 - 15  Glucose, capillary     Status: Abnormal   Collection Time: 12/13/15  7:58 AM  Result Value Ref Range   Glucose-Capillary 103 (H) 65 - 99 mg/dL   Comment 1 Notify RN    Comment 2 Document in Chart   Glucose, capillary     Status: Abnormal   Collection Time: 12/13/15 12:04 PM  Result Value Ref Range   Glucose-Capillary 159 (H) 65 - 99 mg/dL  CBC     Status: Abnormal   Collection Time: 12/13/15  2:02 PM  Result Value Ref Range   WBC 9.8 4.0 - 10.5 K/uL   RBC 2.91 (L) 4.22 - 5.81 MIL/uL   Hemoglobin 8.6 (L) 13.0 - 17.0 g/dL   HCT 25.0 (L) 39.0 - 52.0 %   MCV 85.9 78.0 - 100.0 fL   MCH 29.6 26.0 - 34.0 pg   MCHC 34.4 30.0 - 36.0 g/dL   RDW 15.9 (H) 11.5 - 15.5 %   Platelets 553 (H) 150 - 400 K/uL  Protime-INR     Status: Abnormal   Collection Time: 12/13/15  2:02 PM  Result Value Ref Range   Prothrombin Time 15.9 (H) 11.4 - 15.2 seconds  INR 1.26   APTT     Status: Abnormal   Collection Time: 12/13/15  2:02 PM  Result Value Ref Range   aPTT 38 (H) 24 - 36 seconds    Comment:        IF BASELINE aPTT IS ELEVATED, SUGGEST PATIENT RISK ASSESSMENT BE USED TO DETERMINE APPROPRIATE ANTICOAGULANT THERAPY.   Glucose, capillary     Status: Abnormal   Collection Time: 12/13/15  5:08 PM  Result Value Ref Range   Glucose-Capillary 151 (H) 65 - 99 mg/dL  Glucose, capillary     Status: Abnormal   Collection Time: 12/13/15  7:43 PM  Result Value Ref Range   Glucose-Capillary 136 (H) 65 - 99 mg/dL  Glucose, capillary     Status: Abnormal   Collection Time: 12/13/15 11:49 PM  Result Value Ref Range   Glucose-Capillary 150 (H) 65 - 99 mg/dL  Glucose,  capillary     Status: Abnormal   Collection Time: 12/14/15  4:17 AM  Result Value Ref Range   Glucose-Capillary 149 (H) 65 - 99 mg/dL  Magnesium     Status: None   Collection Time: 12/14/15  5:49 AM  Result Value Ref Range   Magnesium 2.1 1.7 - 2.4 mg/dL  Phosphorus     Status: None   Collection Time: 12/14/15  5:49 AM  Result Value Ref Range   Phosphorus 3.2 2.5 - 4.6 mg/dL  CBC     Status: Abnormal   Collection Time: 12/14/15  5:49 AM  Result Value Ref Range   WBC 7.7 4.0 - 10.5 K/uL   RBC 3.49 (L) 4.22 - 5.81 MIL/uL   Hemoglobin 10.3 (L) 13.0 - 17.0 g/dL   HCT 31.0 (L) 39.0 - 52.0 %   MCV 88.8 78.0 - 100.0 fL   MCH 29.5 26.0 - 34.0 pg   MCHC 33.2 30.0 - 36.0 g/dL   RDW 16.3 (H) 11.5 - 15.5 %   Platelets 516 (H) 150 - 400 K/uL  Differential     Status: Abnormal   Collection Time: 12/14/15  5:49 AM  Result Value Ref Range   Neutrophils Relative % 67 %   Neutro Abs 5.1 1.7 - 7.7 K/uL   Lymphocytes Relative 11 %   Lymphs Abs 0.9 0.7 - 4.0 K/uL   Monocytes Relative 21 %   Monocytes Absolute 1.6 (H) 0.1 - 1.0 K/uL   Eosinophils Relative 1 %   Eosinophils Absolute 0.1 0.0 - 0.7 K/uL   Basophils Relative 0 %   Basophils Absolute 0.0 0.0 - 0.1 K/uL  Comprehensive metabolic panel     Status: Abnormal   Collection Time: 12/14/15  5:49 AM  Result Value Ref Range   Sodium 149 (H) 135 - 145 mmol/L   Potassium 3.4 (L) 3.5 - 5.1 mmol/L   Chloride 120 (H) 101 - 111 mmol/L   CO2 25 22 - 32 mmol/L   Glucose, Bld 148 (H) 65 - 99 mg/dL   BUN 37 (H) 6 - 20 mg/dL   Creatinine, Ser 1.07 0.61 - 1.24 mg/dL   Calcium 8.1 (L) 8.9 - 10.3 mg/dL   Total Protein 5.4 (L) 6.5 - 8.1 g/dL   Albumin 1.9 (L) 3.5 - 5.0 g/dL   AST 23 15 - 41 U/L   ALT 19 17 - 63 U/L   Alkaline Phosphatase 118 38 - 126 U/L   Total Bilirubin 2.0 (H) 0.3 - 1.2 mg/dL   GFR calc non Af Amer >60 >60 mL/min  GFR calc Af Amer >60 >60 mL/min    Comment: (NOTE) The eGFR has been calculated using the CKD EPI  equation. This calculation has not been validated in all clinical situations. eGFR's persistently <60 mL/min signify possible Chronic Kidney Disease.    Anion gap 4 (L) 5 - 15  Glucose, capillary     Status: Abnormal   Collection Time: 12/14/15  7:28 AM  Result Value Ref Range   Glucose-Capillary 145 (H) 65 - 99 mg/dL    Imaging / Studies: No results found.  Medications / Allergies: per chart  Antibiotics: Anti-infectives    Start     Dose/Rate Route Frequency Ordered Stop   12/09/15 1200  anidulafungin (ERAXIS) 100 mg in sodium chloride 0.9 % 100 mL IVPB     100 mg over 90 Minutes Intravenous Every 24 hours 12/09/15 0918 12/10/15 1333   12/05/15 1200  anidulafungin (ERAXIS) 100 mg in sodium chloride 0.9 % 100 mL IVPB  Status:  Discontinued     100 mg over 90 Minutes Intravenous Every 24 hours 12/04/15 1045 12/09/15 0835   12/04/15 1200  anidulafungin (ERAXIS) 200 mg in sodium chloride 0.9 % 200 mL IVPB     200 mg over 180 Minutes Intravenous  Once 12/04/15 1045 12/04/15 1725   12/04/15 0600  cefoTEtan (CEFOTAN) 2 g in dextrose 5 % 50 mL IVPB     2 g 100 mL/hr over 30 Minutes Intravenous On call to O.R. 12/03/15 1821 12/03/15 2007   12/04/15 0400  piperacillin-tazobactam (ZOSYN) IVPB 3.375 g     3.375 g 12.5 mL/hr over 240 Minutes Intravenous Every 8 hours 12/03/15 2229     12/02/15 0000  erythromycin ethylsuccinate (EES) 200 MG/5ML suspension 400 mg  Status:  Discontinued    Comments:  Administer via J tube (16Fr LLQ)   400 mg Oral Every 6 hours 12/01/15 2020 12/03/15 0748   11/27/15 1200  erythromycin 500 mg in sodium chloride 0.9 % 100 mL IVPB     500 mg 100 mL/hr over 60 Minutes Intravenous Every 6 hours 11/27/15 0940 11/29/15 0608   11/26/15 0830  erythromycin 250 mg in sodium chloride 0.9 % 100 mL IVPB  Status:  Discontinued     250 mg 100 mL/hr over 60 Minutes Intravenous Every 6 hours 11/26/15 0751 11/27/15 0940        Note: Portions of this report may have  been transcribed using voice recognition software. Every effort was made to ensure accuracy; however, inadvertent computerized transcription errors may be present.   Any transcriptional errors that result from this process are unintentional.    [name], M.D., F.A.C.S. Gastrointestinal and Minimally Invasive Surgery [facility] [address] [place], [place] [address] [phone] Main / Paging     12/14/2015

## 2015-12-14 NOTE — Progress Notes (Signed)
MD ordered tube feeding through jtube. Per IR on 10/29, Jtube best for drainage and did not advise tube feeding through it. Notified MD. Jamal Maesried to call IR two times to verify and investigate to see if Jtube could be used for feeding. No one there. Will pass on in nursing report. Will continue to monitor pt closely.

## 2015-12-14 NOTE — Progress Notes (Signed)
Date:  December 14, 2015 Chart reviewed for concurrent status and case management needs. Will continue to follow the patient for status change: Weaning from vent to trach. collar Discharge Planning: following for needs Expected discharge date: [id number] Marcelle SmilingRhonda Davis, BSN, [place]3, [place]   [phone]

## 2015-12-14 NOTE — Progress Notes (Addendum)
Nutrition Follow-up  DOCUMENTATION CODES:   Severe malnutrition in context of acute illness/injury  INTERVENTION:  - Continue TPN per pharmacy. - Will order Vital 1.2 @ 10 mL/hr via J-tube (288 kcal, 18 grams of protein, and 195 mL free water).  - RD will follow-up 10/31.  NUTRITION DIAGNOSIS:   Inadequate oral intake related to inability to eat as evidenced by NPO status. -ongoing  GOAL:   Patient will meet greater than or equal to 90% of their needs -met with current TPN regimen.  MONITOR:   Vent status, Weight trends, Labs, I & O's, Other (Comment) (TPN regimen and ability to restart TF via J-tube)  ASSESSMENT:   75 year old male presenting for a post-operative visit. Note for "Post-Operative": Patient returns one month status post robotic resection.  Distal gastrectomy with Billroth I gastroduodenal reconstruction for chronic ulcer causing obstruction.  09/25/2015  10/30 Pt now off of vent and on trach collar; estimated nutrition needs updated this AM based on this event. Weight stable from yesterday. Pt with NGT to suction with 500cc dark drainage present and RN, who is at bedside, states that this is from night shift.   Pt currently receiving Clinimix E 5/15 @ 83 mL/hr with 20% ILE @ 10 mL/hr which is providing 1900 kcal (98.7% minimum kcal need) and 100 grams of protein (95% minimum estimated protein need). Per pharmacy note this AM, plan to continue this rate.   Per IR note, J-tube was replaced yesterday. RD will follow-up tomorrow and monitor for ability to re-start trickle TF via J-tube. At time of visit, BP: 164/55 and MAP: 87.  Medications reviewed; 10 mg Dulcolax/day, 40 mg IV Pepcid BID, sliding scale Novolog, PRN IV Zofran, 10 mEq IV KCl x4 runs today, 1 g Carafate QID.  Labs reviewed; CBGs: 145 and 149 mg/dL, Na: 149 mmol/L, K: 3.4 mmol/L, Cl: 120 mmol/L, BUN: 37 mg/dL, Ca: 8.1 mg/dL.  ADDENDUM:  New order for TF initiation and management received. Spoke with Dr.  Johney Maine via phone. Plan for low-dose TF. Dr. Johney Maine requests elemental TF which is unfortunately not available on formulary. Will order Vital 1.2 which is a semi-elemental, hydrolyzed, peptide-based formula. Plan to order trickle rate and monitor for possibility to slowly advance rate in the future. Dr. Johney Maine reports pt with pneumatosis and hematoma surrounding J-tube site.    10/29 - Patient receiving TPN: Clinimix E 5/15 @ 83 ml/hr (maximum rate d/t TPN shortage) and 20% ILE @ 10 ml/hr providing 1900 kcal (93% of needs) and 100g protein (90% of needs).  - Patient's weight has decreased another 10 lb since 10/27. - Staff currently in room working with patient as pt continues to have leakage from J-tube site. - Pt is also having bloody stools. NGT output today: 350 ml, bilious.  - Per surgery note, pt may need a study and reposition of J-tube today. GI may reevaluate.  Patient is currently intubated on ventilator support via trach MV: 11.2 L/min Temp (24hrs), Avg:98.9 F (37.2 C), Min:97.7 F (36.5 C), Max:99.7 F (37.6 C)    10/27 - Trach done this AM.  - Pt with NGT to suction with ~200cc very dark drainage. - Per notes, G-tube and J-tube to gravity.  - Per Dr. Johney Maine' note, tentative plan to initiate low-rate TF via J-tube once pt off pressors (pt not on pressors at time of RD visit). Note indicates preference for elemental TF; Vital 1.2 is a hydrolyzed peptide-based semi-elemental TF, no true elemental TF formulas available on  formulary.  - Pt continues with TPN via PICC and is currently receiving Clinimix E 5/15 @ 40 mL/hr with 20% lipids @ 10 mL/hr which is providing 48 grams of protein, 1162 kcal.  - Per pharmacy note this AM, plan to increase TPN to Clinimix E 5/15 @ 60 mL/hr with continued 20% lipids @ 10 mL/hr. This regimen will provide 72 grams of protein (65% minimum estimated protein need) and 1502 kcal (71.5% estimated kcal need).  - Per CCM MD/NP notes, questionable bowel  ischemia per GI surgery, pt with anasarca with albumin and Lasix ordered, peritoneal fluid collections with IR-guided drain down today.  - Weight +0.6 kg from yesterday.   Patient is currently intubated on ventilator support via trach MV: 12.1 L/min Temp (24hrs), Avg:98.7 F (37.1 C), Min:97.6 F (36.4 C), Max:99.7 F (37.6 C) BP: 164/65 and MAP: 87   Diet Order:  Diet NPO time specified .TPN (CLINIMIX-E) Adult TPN (CLINIMIX-E) Adult  Skin:  Wound (see comment) (Abdominal incision)  Last BM:  10/30  Height:   Ht Readings from Last 1 Encounters:  12/08/15 6' 2"  (1.88 m)    Weight:   Wt Readings from Last 1 Encounters:  12/14/15 192 lb 14.4 oz (87.5 kg)    Ideal Body Weight:  80.9 kg  BMI:  Body mass index is 24.77 kg/m.  Estimated Nutritional Needs:   Kcal:  1925-2190 (22-25 kcal/kg)  Protein:  105-123 grams (1.2-1.4 grams/kg)  Fluid:  per MD/NP/Surgery  EDUCATION NEEDS:   No education needs identified at this time    Jarome Matin, MS, RD, LDN Inpatient Clinical Dietitian Pager # [phone] After hours/weekend pager # [phone]

## 2015-12-14 NOTE — Progress Notes (Signed)
RT placed PT on 28% ATC at approx 0805- tolerating well at this time. RN aware.

## 2015-12-14 NOTE — Evaluation (Signed)
Physical Therapy (Re-) Evaluation Patient Details Name: [name] MRN: [id number] DOB: [birth year] Today's Date: 12/14/2015   History of Present Illness  75 y/o male with peptic ulcer disease admitted on 10/10 with recurrent gastric outlet obstruction secondary to gastritis admitted underwent G and J tube placement this admission.  On 10/19 had to go back to the OR after he removed his G tube accidentally leading to peritonitis, intubation, septic shock. Rt index finger ischemia likely from Rt brachial a line. Traccheostomy 12/11/15. Trial  off of vent /on Trach collar 10/30 until trip to endo then back on vent..  PT initial evaluation 12/03/15 then discontinued due to VDRF.  Clinical Impression  The patient tolerated exercises and sitting in upright position with the bed/chaIR FEATURE. If remains stable and  Allowed by MD, will progress mobility to sitting on the bed edge and standing as patient able to perform safely. Will assess next visit. Pt admitted with above diagnosis. Pt currently with functional limitations due to the deficits listed below (see PT Problem List).  Pt will benefit from skilled PT to increase their independence and safety with mobility to allow discharge to the venue listed below.    recommend OT consult.     Follow Up Recommendations SNF;LTACH;Supervision/Assistance - 24 hour    Equipment Recommendations  None recommended by PT    Recommendations for Other Services   OT    Precautions / Restrictions Precautions Precautions: Fall Precaution Comments: J tube, G tube, PEG. multiple lines, trached, R UE thrombus with index finger embolic event. Restrictions Weight Bearing Restrictions: (P) No      Mobility  Bed Mobility Overal bed mobility: Needs Assistance             General bed mobility comments: bed placed in chair position. Apatien  used both rails and pulled self forward with mod assist  from back to prevent abdominal strain. Patient held self  for about 15 seconds for 3 trials. .  Transfers                    Ambulation/Gait                Stairs            Wheelchair Mobility    Modified Rankin (Stroke Patients Only)       Balance   Sitting-balance support: Feet supported;Bilateral upper extremity supported Sitting balance-Leahy Scale: Poor Sitting balance - Comments: in bed / chair position                                     Pertinent Vitals/Pain Faces Pain Scale: Hurts a little bit Pain Location: abdomen Pain Descriptors / Indicators: Discomfort;Grimacing Pain Intervention(s): Limited activity within patient's tolerance    Home Living Family/patient expects to be discharged to:: Private residence Living Arrangements: Spouse/significant other Available Help at Discharge: Family Type of Home: House Home Access: Stairs to enter Entrance Stairs-Rails: LawyerLeft;Right Entrance Stairs-Number of Steps: 3 Home Layout: One level Home Equipment: None      Prior Function Level of Independence: Independent               Hand Dominance        Extremity/Trunk Assessment   Upper Extremity Assessment: Generalized weakness           Lower Extremity Assessment: Generalized weakness      Cervical / Trunk Assessment: Other  exceptions  Communication   Communication: Tracheostomy (able to mouth words)  Cognition Arousal/Alertness: Awake/alert Behavior During Therapy: WFL for tasks assessed/performed Overall Cognitive Status: Within Functional Limits for tasks assessed (follows commands well. did not ask date. Patient is very alert.)                      General Comments      Exercises General Exercises - Upper Extremity Shoulder Flexion: AAROM;Both;10 reps;Seated General Exercises - Lower Extremity Ankle Circles/Pumps: AROM;Both;10 reps;Seated Long Arc Quad: AROM;Both;20 reps;Seated Heel Slides: Seated Hip ABduction/ADduction: AROM;Both;5  reps;Supine Straight Leg Raises: AROM;Both;5 reps;Supine Hip Flexion/Marching: AROM;Both;Seated;20 reps   Assessment/Plan    PT Assessment Patient needs continued PT services  PT Problem List Decreased strength;Decreased activity tolerance;Decreased balance;Decreased mobility;Cardiopulmonary status limiting activity;Decreased knowledge of precautions;Decreased safety awareness;Decreased knowledge of use of DME          PT Treatment Interventions DME instruction;Functional mobility training;Gait training;Therapeutic activities;Therapeutic exercise;Balance training;Patient/family education    PT Goals (Current goals can be found in the Care Plan section)  Acute Rehab PT Goals Patient Stated Goal: per daughter, to get stronger PT Goal Formulation: With patient/family Time For Goal Achievement: 12/28/15 Potential to Achieve Goals: Good    Frequency Min 3X/week   Barriers to discharge        Co-evaluation               End of Session Equipment Utilized During Treatment: Oxygen (28% trach collar) Activity Tolerance: Patient tolerated treatment well Patient left: in bed;with call bell/phone within reach;with family/visitor present Nurse Communication: Mobility status         Time: 0936-1010 PT Time Calculation (min) (ACUTE ONLY): 34 min   Charges:   PT Evaluation $PT moderatel High Complexity: 1 Procedure PT Treatments $Therapeutic Exercise: 8-22 mins   PT G Codes:        [name], [name] 12/14/2015, 11:21 AM Blanchard KelchKaren Authur [name] PT [phone]

## 2015-12-14 NOTE — Progress Notes (Signed)
Patient ID: [name], male   DOB: January 21, 1941, 75 y.o.   MRN: [id number]    Referring Physician(s): Dr. Karie SodaSteven Gross  Supervising Physician: Ruel FavorsShick, Trevor  [facility]  Chief Complaint: Bile peritonitis  Subjective: Patient on vent and awake.  Mouths things to me.  Allergies: Flomax [tamsulosin hcl]; Lisinopril; Lorazepam; and Uroxatral [alfuzosin hcl er]  Medications: Prior to Admission medications   Medication Sig Start Date End Date Taking? Authorizing Provider  ALPRAZolam Prudy Feeler(XANAX) 0.5 MG tablet Take 0.5 mg by mouth at bedtime as needed for sleep or anxiety. 10/02/15  Yes Historical Provider, MD  aspirin EC 81 MG tablet Take 81 mg by mouth every evening.    Yes Historical Provider, MD  atorvastatin (LIPITOR) 40 MG tablet Take 40 mg by mouth at bedtime.    Yes Historical Provider, MD  calcium-vitamin D (OSCAL WITH D) 500-200 MG-UNIT tablet Take 1 tablet by mouth daily with breakfast.   Yes Historical Provider, MD  Cyanocobalamin (VITAMIN B-12) 1000 MCG SUBL Place 1 tablet under the tongue daily with breakfast.   Yes Historical Provider, MD  Docusate Calcium (STOOL SOFTENER PO) Take 1 capsule by mouth daily as needed (for constipation).    Yes Historical Provider, MD  Multiple Vitamin (MULTIVITAMIN) tablet Take 2 tablets by mouth daily with breakfast.    Yes Historical Provider, MD  nitroGLYCERIN (NITROSTAT) 0.4 MG SL tablet Place 0.4 mg under the tongue every 5 (five) minutes as needed for chest pain.   Yes Historical Provider, MD  pantoprazole (PROTONIX) 40 MG tablet Take 40 mg by mouth 2 (two) times daily. 06/02/15  Yes Historical Provider, MD  Polyvinyl Alcohol-Povidone (REFRESH OP) Place 2 drops into both eyes as needed (For dry eyes.).    Yes Historical Provider, MD  RESTASIS 0.05 % ophthalmic emulsion Place 1 drop into both eyes 2 (two) times daily.  04/16/13  Yes Historical Provider, MD  valsartan (DIOVAN) 80 MG tablet Take 80 mg by mouth daily with  breakfast.    Yes Historical Provider, MD  metoCLOPramide (REGLAN) 5 MG tablet Take 1-2 tablets (5-10 mg total) by mouth every 6 (six) hours as needed for nausea or vomiting. Patient not taking: Reported on 11/24/2015 10/01/15   Karie SodaSteven Gross, MD  traMADol (ULTRAM) 50 MG tablet Take 1-2 tablets (50-100 mg total) by mouth every 6 (six) hours as needed for moderate pain or severe pain. Patient not taking: Reported on 11/24/2015 10/01/15   Karie SodaSteven Gross, MD    Vital Signs: BP (!) 155/66   Pulse 76   Temp 99.3 F (37.4 C) (Oral)   Resp (!) 26   Ht 6\' 2"  (1.88 m)   Wt 192 lb 14.4 oz (87.5 kg)   SpO2 100%   BMI 24.77 kg/m   Physical Exam: Abd: soft, very tender around drain sites.  Left-sided drain sites are erythematous secondary to skin excoriation from bile leakage.  No evidence of infections.  J-tube to gravity bag with significant bilious/bloody output.  Both JPs on the left have minimal output and appear to mostly be serous or serosang.  The right-sided drain with some bile tinged serous fluid and fibrinous material.  Drain site on this side is c/d/i  Imaging: Ct Abdomen Pelvis W Contrast  Result Date: 12/10/2015 CLINICAL DATA:  One week postop from laparoscopy for gastric and jejunal perforation. Previous distal gastrectomy with Billroth 1 for ulcer disease. EXAM: CT ABDOMEN AND PELVIS WITH CONTRAST TECHNIQUE: Multidetector CT imaging of the abdomen and pelvis  was performed using the standard protocol following bolus administration of intravenous contrast. CONTRAST:  80mL ISOVUE-300 IOPAMIDOL (ISOVUE-300) INJECTION 61% COMPARISON:  11/25/2015 FINDINGS: Lower Chest: New small pleural effusions and basilar atelectasis, left side greater than right. Hepatobiliary: No mass identified. Prior cholecystectomy noted. No evidence of biliary dilatation. Pancreas:  No mass or inflammatory changes. Spleen: Within normal limits in size and appearance. Adrenals/Urinary Tract: No masses identified. No  evidence of hydronephrosis. Foley catheter seen within the urinary bladder which is decompressed. Stomach/Bowel: Nasogastric tube and percutaneous gastrostomy tube are seen in the stomach. Percutaneous jejunostomy tube seen in place as well as several surgical drains. Moderate dilatation of proximal and mid small bowel loops is seen with transition point in the anterior right lower quadrant on image 59/2, consistent with a partial small bowel obstruction. Distal small bowel loops are nondilated. Pneumatosis is seen involving multiple dilated proximal and mid small bowel loops in the left abdomen and pelvis, suspicious for bowel ischemia. There is no evidence of portal venous gas or free intraperitoneal air. A left upper quadrant fluid collection is seen along the lateral aspect of proximal stomach in the gastrohepatic ligament which measures 6.2 x 9.0 cm. A left subdiaphragmatic/perisplenic fluid collection is also seen which measures 4.2 x 9.6 cm. These may represent postop fluid collections or abscesses. Small amount of free fluid noted in pelvic cul-de-sac. Vascular/Lymphatic: No pathologically enlarged lymph nodes. No abdominal aortic aneurysm. Aortic atherosclerosis. Reproductive:  No mass identified. Other:  Diffuse mesenteric and body wall edema. Musculoskeletal:  No suspicious bone lesions identified. IMPRESSION: Moderate dilatation of proximal and mid small bowel loops, with transition point in anterior right lower quadrant, suspicious for partial small bowel obstruction. Pneumatosis involving multiple dilated small bowel loops in the left abdomen pelvis, suspicious for bowel ischemia. No evidence of portal venous gas or free intraperitoneal air. Two left upper quadrant fluid collections adjacent to the stomach and spleen, which may represent postoperative fluid collections or abscesses. Small amount free fluid also noted in pelvis. Small bilateral pleural effusions and bibasilar atelectasis. Diffuse  mesenteric and body wall edema. Electronically Signed   By: Myles Rosenthal M.D.   On: 12/10/2015 15:30   Dg Chest Port 1 View  Result Date: 12/11/2015 CLINICAL DATA:  Tracheostomy tube EXAM: PORTABLE CHEST 1 VIEW COMPARISON:  400 hour FINDINGS: The endotracheal tube has been exchanged for a Shiley tracheostomy tube. Tip is 6.7 cm from the carina. Lungs are hyperaerated. Hazy opacity at the left base has improved. Normal heart size. No pneumothorax. Left subclavian central venous catheter and left PICC are stable. IMPRESSION: Tracheostomy tube as described. Improved hazy airspace disease at the left base. Electronically Signed   By: [name] M.D.   On: 12/11/2015 09:37   Dg Chest Port 1 View  Result Date: 12/11/2015 CLINICAL DATA:  Respiratory failure, endotracheal tube position. EXAM: PORTABLE CHEST 1 VIEW COMPARISON:  12/09/2015 FINDINGS: Endotracheal tube unchanged with tip 5.2 cm above the carina. Left subclavian central venous catheter unchanged. Interval placement of nasogastric tube with side port just above the expected region of the gastroesophageal junction as tube courses into the region of the stomach and off the inferior portion of the film as tip is not visualized but likely over the stomach. Interval placement of left-sided PICC line with tip over the SVC. Lungs are adequately inflated with stable subtle hazy density over the left base likely atelectasis. Cardiomediastinal silhouette and remainder of the exam is unchanged. IMPRESSION: Persistent subtle hazy density over the  left base likely atelectasis. Tubes and lines as described. Electronically Signed   By: Elberta Fortisaniel  Boyle M.D.   On: 12/11/2015 07:35   Ct Image Guided Drainage By Percutaneous Catheter  Result Date: 12/11/2015 CLINICAL DATA:  Peritonitis and separate fluid collections adjacent to the greater curvature the stomach and lateral to the spleen in the left upper quadrant. The patient presents for percutaneous drainage of  these collections. EXAM: CT GUIDED CATHETER DRAINAGE OF PERITONEAL ABSCESS X 2 ANESTHESIA/SEDATION: 2.0 mg IV Versed 50 mcg IV Fentanyl Total Moderate Sedation Time:  36 minutes The patient's level of consciousness and physiologic status were continuously monitored during the procedure by Radiology nursing. PROCEDURE: The procedure, risks, benefits, and alternatives were explained to the patient's wife. Questions regarding the procedure were encouraged and answered. The patient's wife understands and consents to the procedure. A time out was performed prior to initiating the procedure. The left abdominal wall was prepped with chlorhexidine in a sterile fashion, and a sterile drape was applied covering the operative field. A sterile gown and sterile gloves were used for the procedure. Local anesthesia was provided with 1% Lidocaine. CT was performed through the upper abdomen in a supine position. After localizing sites for percutaneous drain placement, 2 separate 18 gauge trocar needles were advanced under CT guidance into separate collections. Fluid aspiration was performed and a sample sent for culture analysis. Guidewires were advanced and trocar needles removed. The 2 separate tracts were dilated to 10 [place] and over guidewires, separate 10 French percutaneous drainage catheters advance. Catheter positioning was confirmed by CT. The catheters were connected to suction bulbs. The catheters were secured at the skin with Prolene retention sutures and StatLock devices. COMPLICATIONS: None FINDINGS: Both collections yielded clear fluid. Fluid adjacent to the greater curvature of the stomach was slightly blood tinged. Fluid adjacent to the spleen was yellowish in color. Separate 10 French drains were placed in both collections and are draining well after placement. IMPRESSION: CT-guided percutaneous drainage of separate left upper quadrant peritoneal collections along the greater curvature of the stomach and lateral  to the spleen. 10 French drains were placed and attached to suction bulb drainage. Given similar nature of fluid from both collections, samples were combined and sent for a single abscess culture analysis. Electronically Signed   By: Irish LackGlenn  Yamagata M.D.   On: 12/11/2015 13:44   Ct Image Guided Drainage By Percutaneous Catheter  Result Date: 12/11/2015 CLINICAL DATA:  Peritonitis and separate fluid collections adjacent to the greater curvature the stomach and lateral to the spleen in the left upper quadrant. The patient presents for percutaneous drainage of these collections. EXAM: CT GUIDED CATHETER DRAINAGE OF PERITONEAL ABSCESS X 2 ANESTHESIA/SEDATION: 2.0 mg IV Versed 50 mcg IV Fentanyl Total Moderate Sedation Time:  36 minutes The patient's level of consciousness and physiologic status were continuously monitored during the procedure by Radiology nursing. PROCEDURE: The procedure, risks, benefits, and alternatives were explained to the patient's wife. Questions regarding the procedure were encouraged and answered. The patient's wife understands and consents to the procedure. A time out was performed prior to initiating the procedure. The left abdominal wall was prepped with chlorhexidine in a sterile fashion, and a sterile drape was applied covering the operative field. A sterile gown and sterile gloves were used for the procedure. Local anesthesia was provided with 1% Lidocaine. CT was performed through the upper abdomen in a supine position. After localizing sites for percutaneous drain placement, 2 separate 18 gauge trocar needles were advanced under  CT guidance into separate collections. Fluid aspiration was performed and a sample sent for culture analysis. Guidewires were advanced and trocar needles removed. The 2 separate tracts were dilated to 10 [place] and over guidewires, separate 10 French percutaneous drainage catheters advance. Catheter positioning was confirmed by CT. The catheters were  connected to suction bulbs. The catheters were secured at the skin with Prolene retention sutures and StatLock devices. COMPLICATIONS: None FINDINGS: Both collections yielded clear fluid. Fluid adjacent to the greater curvature of the stomach was slightly blood tinged. Fluid adjacent to the spleen was yellowish in color. Separate 10 French drains were placed in both collections and are draining well after placement. IMPRESSION: CT-guided percutaneous drainage of separate left upper quadrant peritoneal collections along the greater curvature of the stomach and lateral to the spleen. 10 French drains were placed and attached to suction bulb drainage. Given similar nature of fluid from both collections, samples were combined and sent for a single abscess culture analysis. Electronically Signed   By: Irish Lack M.D.   On: 12/11/2015 13:44    Labs:  CBC:  Recent Labs  12/12/15 2006 12/13/15 0447 12/13/15 1402 12/14/15 0549  WBC 11.7* 11.3* 9.8 7.7  HGB 6.3* 8.3* 8.6* 10.3*  HCT 18.5* 25.0* 25.0* 31.0*  PLT 519* 493* 553* 516*    COAGS:  Recent Labs  12/04/15 2020 12/11/15 0405 12/13/15 1402  INR 1.62 1.25 1.26  APTT 39*  --  38*    BMP:  Recent Labs  12/11/15 0405 12/12/15 0500 12/13/15 0522 12/14/15 0549  NA 143 145 147* 149*  K 3.4* 3.4* 3.5 3.4*  CL 116* 114* 120* 120*  CO2 22 24 23 25   GLUCOSE 143* 126* 156* 148*  BUN 46* 42* 37* 37*  CALCIUM 7.4* 7.8* 8.0* 8.1*  CREATININE 1.51* 1.45* 1.15 1.07  GFRNONAA 43* 45* 60* >60  GFRAA 50* 52* >60 >60    LIVER FUNCTION TESTS:  Recent Labs  12/09/15 0409 12/10/15 0418 12/13/15 0522 12/14/15 0549  BILITOT 1.1 0.6 2.2* 2.0*  AST 24 25 24 23   ALT 21 20 20 19   ALKPHOS 120 117 121 118  PROT 4.3* 4.5* 5.3* 5.4*  ALBUMIN 1.4* 1.5* 2.1* 1.9*    Assessment and Plan: 1. S/p perc drain of 2 fluid collections on 10/27 -stable, draining well -CX is negative so far 2. Replacement of J-tube, 10/29 -with bile leakage  around tube, but bag with copious bilious/old bloody output -excoriation around all drain sites on the left.  Will add barrier cream to around the drains and prn dressing changes with saturation to help prevent further skin breakdown and to help with pain control. -will follow Electronically Signed: Oniel Meleski E 12/14/2015, 8:59 AM   I spent a total of 15 Minutes at the the patient's bedside AND on the patient's hospital floor or unit, greater than 50% of which was counseling/coordinating care for intra-abdominal fluid collections

## 2015-12-14 NOTE — Progress Notes (Signed)
RT placed PT back on 28% ATC- tolerating well at this time. RN aware.

## 2015-12-14 NOTE — Op Note (Signed)
[facility] Patient Name: [name] Procedure Date: 12/14/2015 MRN: [id number] Attending MD: Vida Rigger , MD Date of Birth: 13-Jul-1940 CSN: [id number] Age: 75 Admit Type: Inpatient Procedure:                Upper GI endoscopy Indications:              Active gastrointestinal bleeding From jejunal tube Providers:                Vida Rigger, MD, Anthony Sar, RN, Oletha Blend,                            Technician Referring MD:              Medicines:                Fentanyl 50 micrograms IV, Midazolam 4 mg IV Complications:            No immediate complications. Estimated Blood Loss:     Estimated blood loss: none. Procedure:                Pre-Anesthesia Assessment:                           - Prior to the procedure, a History and Physical                            was performed, and patient medications and                            allergies were reviewed. The patient's tolerance of                            previous anesthesia was also reviewed. The risks                            and benefits of the procedure and the sedation                            options and risks were discussed with the patient.                            All questions were answered, and informed consent                            was obtained. Prior Anticoagulants: The patient has                            taken no previous anticoagulant or antiplatelet                            agents. ASA Grade Assessment: III - A patient with                            severe systemic disease. After reviewing the risks  and benefits, the patient was deemed in                            satisfactory condition to undergo the procedure.                           After obtaining informed consent, the endoscope was                            passed under direct vision. Throughout the                            procedure, the patient's blood pressure, pulse, and                             oxygen saturations were monitored continuously. The                            EG-[id number] ([id number]) scope was introduced through the                            mouth, and advanced to the jejunum. The upper GI                            endoscopy was accomplished without difficulty. The                            patient tolerated the procedure well. Scope In: Scope Out: Findings:      LA Grade A (one or more mucosal breaks less than 5 mm, not extending       between tops of 2 mucosal folds) esophagitis with no bleeding was found.       probably from NG trauma      A gastric tube was found on the greater curvature of the stomach.      Diffuse moderate inflammation characterized by congestion (edema) and       erythema was found in the entire examined stomach. could not insufflate       the stomach      Evidence of a stenosed Billroth I gastroduodenostomy was found. A       gastric pouch was found containing suture material and ulceration. The       gastroduodenal anastomosis was characterized by ulceration and visible       sutures. This was traversed.with minimal resistance      Few non-bleeding linear superficial duodenal ulcers with no stigmata of       bleeding were found in the entire duodenum. but the underlining mucosa       looked normal      Few non-bleeding linear superficial ulcers with no stigmata of bleeding       were found in the jejunum.      The exam was otherwise without abnormality. Impression:               - LA Grade A erosive esophagitis.probably from NG  trauma                           - A gastric tube was found in the stomach.                           - Gastritis.                           - Stenosed Billroth I gastroduodenostomy was found,                            characterized by ulceration and visible sutures.                            but able to advance the scope past                           - Multiple  non-bleeding duodenal ulcers with no                            stigmata of bleeding. endoscope was advanced about                            25 cm past the anastomosis but did not reach the                            feeding tube                           - Multiple non-bleeding jejunal ulcers with no                            stigmata of bleeding.                           - The examination was otherwise normal.                           - No specimens collected. Moderate Sedation:      Moderate (conscious) sedation was administered by the endoscopy nurse       and supervised by the endoscopist. The following parameters were       monitored: oxygen saturation, heart rate, blood pressure, respiratory       rate, EKG, adequacy of pulmonary ventilation, and response to care. Recommendation:           - Patient has a contact number available for                            emergencies. The signs and symptoms of potential                            delayed complications were discussed with the                            patient. Return to  normal activities tomorrow.                            Written discharge instructions were provided to the                            patient.                           - NPO indefinitely. not sure if patient needs an NG                            tube since he has a G-tube                           - Continue present medications.                           - Return to GI clinic PRN.                           - Telephone GI clinic if symptomatic PRN.                            questionable endoscopy via J-tube site depending on                            the size of the tube if bleeding continues and                            please let me know this weekend if I can be of any                            further assistance otherwise I will check on                            tomorrow and findings discussed with the patient's                             family Procedure Code(s):        --- Professional ---                           [id number], Esophagogastroduodenoscopy, flexible,                            transoral; diagnostic, including collection of                            specimen(s) by brushing or washing, when performed                            (separate procedure) Diagnosis Code(s):        --- Professional ---  K20.8, Other esophagitis                           Z93.1, Gastrostomy status                           K29.70, Gastritis, unspecified, without bleeding                           Z98.0, Intestinal bypass and anastomosis status                           K26.9, Duodenal ulcer, unspecified as acute or                            chronic, without hemorrhage or perforation                           K28.9, Gastrojejunal ulcer, unspecified as acute or                            chronic, without hemorrhage or perforation                           K92.2, Gastrointestinal hemorrhage, unspecified CPT copyright 2016 [employer]. All rights reserved. The codes documented in this report are preliminary and upon coder review may  be revised to meet current compliance requirements. Vida Rigger, MD 12/14/2015 12:14:38 PM This report has been signed electronically. Number of Addenda: 0

## 2015-12-14 NOTE — Progress Notes (Signed)
[facility] NOTE   Pharmacy Consult for TPN  Patient Measurements: Height: _0  (188 cm) Weight: 192 lb 14.4 oz (87.5 kg) IBW/kg (Calculated) : 82.2 TPN AdjBW (KG): 97.2 Body mass index is 24.77 kg/m.  Insulin Requirements: 7 units Novolog in past 24 hours  Current Nutrition: NPO  IVF: NS at 10 ml/hr  Central access: PICC placed 10/12, pt pulled out 10/19. CVC in place since 10/19 TPN date: 10/12-10/19, resumed 10/25  ASSESSMENT                                                                                                          HPI: 58 yoM admitted 10/9 with new upper abdominal discomfort and bloating with concern for recurrent GOO due to chronic pyloric stricture. 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  Pharmacy consulted to start TPN for gastric ileus and obstruction 10/12. Patient underwent G and J tube placement this admission. On 10/19, patient pulled out PICC line. He also had to go back to OR after he removed his G tube accidentally, leading to peritonitis, intubation, and septic shock. Tube feed rate slowly advanced by CCS, but today patient noted to have dark brown output around J tube site, and tube feeds held. Pharmacy asked to resume TPN.   Today, 12/14/15:   Glucose - No hx DM noted. CBGs now mostly at goal <150 (range 103-156).  Electrolytes - K 3.4, Cl- high at 120 (stable), all other lytes including Corrected Ca WNL  Renal - AKI, I/O: 2561/1735  LFTs - AST/ALT, Alk Phos, Tbili WNL  TGs -76 (10/13), 97 (10/16), 214 (10/26), 194 (10/30)  Prealbumin - 17.8 (10/11), 12.8 (10/13), 11 (10/16), 12.5 (10/18), < 5 (10/23), 6.4 (10/26), 9.2 (10/30)  Bleeding overnight at J-tube site requiring PRBCs  Noted RD's recommendation for Vital 1.2 @ 29m/hr when medically feasible  NUTRITIONAL GOALS                                                                                              RD recs (as of 10/27): KHWTU:8828Protein:111-138 grams (1.2-1.5 grams/kg)  Clinimix 5/15 at a goal rate of 965mhr would provide 108g/day protein, 2013Kcal/day.  **There is currently a naPsychologist, prison and probation servicesf Clinimix solution. No 2L bags of Clinimix E 5/20 currently available. To conserve supply, will consider goal rate of Clinimix 5/15 at 83 ml/hr + 20% fat emulsion at 1033mr, which will keep total volume at 2L rather than increasing use to 3L.   PLAN  KCl 22mq IV x4 runs                         At 1800 today:  Continue Clinimix E 5/15 to 83 ml/hr (max rate).   20% fat emulsion at 10 ml/hr.  TPN to contain standard multivitamins and trace elements.  IVF per MD.  Continue sensitive SSI q4h.   TPN lab panels on Mondays & Thursdays.    F/u daily.  TF to begin at 143m/hr today  ElDolly RiasPh 12/14/2015, 9:27 AM Pager [phone]

## 2015-12-14 NOTE — Progress Notes (Signed)
PT remained on 28% ATC from approx 0805 until 1100- placed back on Vent for Endoscopy procedure. RN aware.

## 2015-12-14 NOTE — Consult Note (Signed)
WOC Nurse wound follow up Wound type: peritube skin  Measurement: 10cm x 10cm area with scattered partial thickness skin loss and epidermal erythema Wound bed:as described above,  Pink, red, warm Drainage (amount, consistency, odor) thin, light brown Periwound:beginning of denudation Dressing procedure/placement/frequency:Topical application of durable acrylate has been ineffective; corrosive nature of effluent has penetrated barrier.  I will now try a more conservative, but effective physical barrier (i.e., zinc oxide) to both protect the skin and aide in the reepithelialization of the previously eroded tissue. WOC nursing team will not follow routinely, but will remain available to this patient, the nursing and medical teams.   Thanks, Ladona MowLaurie Taraya Steward, MSN, RN, GNP, Hans EdenCWOCN, CWON-AP, FAAN  Pager# [phone]

## 2015-12-14 NOTE — Progress Notes (Signed)
PCCM PROGRESS NOTE  Admission date: 11/24/2015 Consult date: 12/03/2015 Referring provider: Dr. Michaell Cowing  CC: Abdominal pain  BRIEF:  75 y/o male with peptic ulcer disease admitted on 10/10 with recurrent gastric outlet obstruction secondary to gastritis admitted underwent G and J tube placement this admission.  On 10/19 had to go back to the OR after he removed his G tube accidentally leading to peritonitis, intubation, septic shock.  Subjective:  Pulled out J, Had bleeding reported around J tube over weekend, 2 U PRBC given, IR replaced J tube Off vent this morning  Vital signs: BP (!) 164/55 (BP Location: Right Arm)   Pulse 76   Temp 99.3 F (37.4 C) (Oral)   Resp (!) 26   Ht 6\' 2"  (1.88 m)   Wt 192 lb 14.4 oz (87.5 kg)   SpO2 100%   BMI 24.77 kg/m   Intake/output: I/O last 3 completed shifts: In: 3994.6 [I.V.:3377.3; Blood:33.3; Other:240; NG/GT:240; IV Piggyback:104] Out: 1610 [RUEAV:4098; Emesis/NG output:1000; Drains:530]   Vent settings: Vent Mode: PRVC FiO2 (%):  [28 %-30 %] 28 % Set Rate:  [14 bmp] 14 bmp Vt Set:  [660 mL] 660 mL PEEP:  [5 cmH20] 5 cmH20 Plateau Pressure:  [13 cmH20-19 cmH20] 13 cmH20  General: chronically ill appearing, but more awake and alert today, interactive HEENT: NCAT, #6 perc trach in place. Janina Mayo site is clean PULM: CTA B today, normal effort CV: RRR, few irregular beats ABD: Abdominal drains, J tube dressing with bilious, bloody drainage MSK: normal bulk and tone Derm: anasarca, thin skin Neuro: Awake, alert, following commands and interacting with me  CMP Latest Ref Rng & Units 12/14/2015 12/13/2015 12/12/2015  Glucose 65 - 99 mg/dL 119(J) 478(G) 956(O)  BUN 6 - 20 mg/dL 13(Y) 86(V) 78(I)  Creatinine 0.61 - 1.24 mg/dL 6.96 2.95 2.84(X)  Sodium 135 - 145 mmol/L 149(H) 147(H) 145  Potassium 3.5 - 5.1 mmol/L 3.4(L) 3.5 3.4(L)  Chloride 101 - 111 mmol/L 120(H) 120(H) 114(H)  CO2 22 - 32 mmol/L 25 23 24   Calcium 8.9 - 10.3 mg/dL  8.1(L) 8.0(L) 7.8(L)  Total Protein 6.5 - 8.1 g/dL 3.2(G) 5.3(L) -  Total Bilirubin 0.3 - 1.2 mg/dL 2.0(H) 2.2(H) -  Alkaline Phos 38 - 126 U/L 118 121 -  AST 15 - 41 U/L 23 24 -  ALT 17 - 63 U/L 19 20 -    CBC Latest Ref Rng & Units 12/14/2015 12/13/2015 12/13/2015  WBC 4.0 - 10.5 K/uL 7.7 9.8 11.3(H)  Hemoglobin 13.0 - 17.0 g/dL 10.3(L) 8.6(L) 8.3(L)  Hematocrit 39.0 - 52.0 % 31.0(L) 25.0(L) 25.0(L)  Platelets 150 - 400 K/uL 516(H) 553(H) 493(H)   ABG    Component Value Date/Time   PHART 7.344 (L) 12/07/2015 2230   PCO2ART 31.4 (L) 12/07/2015 2230   PO2ART 132 (H) 12/07/2015 2230   HCO3 16.7 (L) 12/07/2015 2230   TCO2 26 12/04/2007 0638   ACIDBASEDEF 7.6 (H) 12/07/2015 2230   O2SAT 98.3 12/07/2015 2230   CBG (last 3)   Recent Labs  12/13/15 2349 12/14/15 0417 12/14/15 0728  GLUCAP 150* 149* 145*   Imaging: No results found.PCXR w/ minimal L base atx. Aeration improved. Trach good position.   Studies: 10/11 CT abdomen/pelvis> significant distension of the stomach with layering fluid and gas, NG present, worrisome for recurrent gastric outlet obstruction.  RLL consolidation, cholecycstectomy 10/26 CT abdomen/pelvis> Moderate dilatation of proximal and mid small bowel loops, with transition point in anterior right lower quadrant, suspicious for partial small  bowel obstruction. Pneumatosis involving multiple dilated small bowel loops in the left abdomen pelvis, suspicious for bowel ischemia. No evidence of portal venous gas or free intraperitoneal air. Two left upper quadrant fluid collections adjacent to the stomach and spleen, which may represent postoperative fluid collections or abscesses. Small amount free fluid also noted in pelvis.Small bilateral pleural effusions and bibasilar atelectasis. Diffuse mesenteric and body wall edema.  Cultures: Blood 10/19 >> neg Fluid cx 10/17 >> pending  Antibiotics: Zosyn 10/19 >>  Anidulafungin 10/19 >> 10/26   Events: 10/10   admission 10/17  Lap placement of J & G tubes, EGD w/ balloon dilation of duodenal stricture, gastric biopsies, closure of gastrotomy 10/18  sundowning, pulled out G tube 10/19  laparaoscopy (findings gastric leak, bilious ascites, jejunal disruption), omentoplexy of jejunal disruption, gastric leak 10/24 extubated. Required re-intubation.  10/25: placed on precedex for acute encephalopathy; new drainage from J tube. tubefeeds stopped.   10/26 Heparin off since this am (as recommended by vascular). Still on pressors. Getting albumin and lasix to a/w mobilizing third spaced fluid/anasarca. Developed rectal bleeding. This has initiated after starting bowel prep. Hgb has drifted from 8 to 7.7 --> stopped [place] heparin. CT found evidence of bowel pneumotosis worrisome for ischemia, and two LUQ fluid collections 10/27: trach placed. TO IR for Perc drains to be placed in LUQ   Lines/tube: 10/19 ETT >> 10/24 then replaced 10/24 > 10/27 10/19 CVL left subclavian >>  10/19 R brachial arterial line >> 10/20 10/19 abdominal drain x3 >> 10/19 G tube >> 10/19 J tube >> 10/27 Trach placed (feinstein # 6 cuffed), Abd drains by IR 10/29 J dislodged, replaced by IR  Summary: 75 yo male former smoker with progressive epigastric pain, nausea, vomiting, bloating from gastric outlet obstruction with pyloric channel ulcer.  Had Billroth 1, vagotomy, fundoplication, omentopexy August 2017 with initial improvement.  Had recurrent symptoms September 2017 that become progressively worse with gastric dilation from recurrent stricture.  Had laparoscopic placement of jejunostomy tube, gastrostomy tube, and EGD balloon dilation of duodenal stricture 10/17.  Developed gastric leakage with peritonitis after pulling out PEG tube 10/19 and taken to OR. On 10/27 found 2 new LUQ abscesses/fluid collections which were drained percutaneously by IR. Had perc trach 10/27. PMHx of CAD s/p stent, GERD, HTN,  HLD  Assessment/plan:  Acute respiratory failure > normal vent mechanics> improving Tracheostomy (placed 10/27) due prolonged critical illness, deconditioning and severe protein calorie malnutrition. Mental status improving.  - tracheostomy collar trial today - speaking valve evaluation if he does well today - Cont routine trach care   Recurrent gastric outlet obstruction Recurrent GI bleed with new oozing from J tube Abdominal abscess/fluid collections s/p IR drains - Surgery and GI medicine to discuss need for endoscopy  - holding tubefeeds - Day 11 of zosyn-->would favor stopping at this point, will defer to surgery - Follow fluid cx from IR drainage  AKI >> improved  Hypokalemia Hyperchloremia  - monitor renal function, urine outpt - renal adjust meds  - replaced electrolytes as needed  Acute metabolic encephalopathy > minimal Post-op pain Severe Physical deconditioning  - PRN fent for pain - haldol prn for severe agitation - continue scheduled haldol, monitor QTc on tele monitor - scheduled tylenol  Rt index finger ischemia likely from Rt brachial a line. > improved, no clot on upper ext doppler ultrasound - VVS consulted 10/20, appreciate input-->resolved  - no more Alines right arm  Hyperglycemia - SSI  Anasarca - cont  nutritional support    Protein malnutrition - per Surgery; continue TNA  SUP - Protonix DVT prophylaxis - SCDs Goals of care - Full code  My cc time 35 minutes  Heber CarolinaBrent Deauna Yaw, MD [facility] Pager: [phone] Cell: [phone] After 3pm or if no response, call [phone]  12/14/2015, 9:49 AM

## 2015-12-15 ENCOUNTER — Encounter (HOSPITAL_COMMUNITY): Payer: Self-pay | Admitting: Interventional Radiology

## 2015-12-15 DIAGNOSIS — K913 Postprocedural intestinal obstruction, unspecified as to partial versus complete: Secondary | ICD-10-CM

## 2015-12-15 LAB — GLUCOSE, CAPILLARY
GLUCOSE-CAPILLARY: 125 mg/dL — AB (ref 65–99)
GLUCOSE-CAPILLARY: 127 mg/dL — AB (ref 65–99)
GLUCOSE-CAPILLARY: 147 mg/dL — AB (ref 65–99)
Glucose-Capillary: 131 mg/dL — ABNORMAL HIGH (ref 65–99)
Glucose-Capillary: 148 mg/dL — ABNORMAL HIGH (ref 65–99)
Glucose-Capillary: 129 mg/dL — ABNORMAL HIGH (ref 65–99)

## 2015-12-15 LAB — AEROBIC/ANAEROBIC CULTURE W GRAM STAIN (SURGICAL/DEEP WOUND): Culture: NO GROWTH

## 2015-12-15 LAB — CBC WITH DIFFERENTIAL/PLATELET
Basophils Absolute: 0 10*3/uL (ref 0.0–0.1)
Basophils Relative: 0 %
EOS ABS: 0.1 10*3/uL (ref 0.0–0.7)
EOS PCT: 1 %
HCT: 26.4 % — ABNORMAL LOW (ref 39.0–52.0)
Hemoglobin: 8.8 g/dL — ABNORMAL LOW (ref 13.0–17.0)
LYMPHS ABS: 1.2 10*3/uL (ref 0.7–4.0)
Lymphocytes Relative: 12 %
MCH: 29.7 pg (ref 26.0–34.0)
MCHC: 33.3 g/dL (ref 30.0–36.0)
MCV: 89.2 fL (ref 78.0–100.0)
MONO ABS: 1.9 10*3/uL — AB (ref 0.1–1.0)
Monocytes Relative: 19 %
Neutro Abs: 7 10*3/uL (ref 1.7–7.7)
Neutrophils Relative %: 68 %
PLATELETS: 633 10*3/uL — AB (ref 150–400)
RBC: 2.96 MIL/uL — AB (ref 4.22–5.81)
RDW: 16 % — AB (ref 11.5–15.5)
WBC: 10.2 10*3/uL (ref 4.0–10.5)

## 2015-12-15 LAB — BASIC METABOLIC PANEL
ANION GAP: 6 (ref 5–15)
Anion gap: 5 (ref 5–15)
BUN: 35 mg/dL — ABNORMAL HIGH (ref 6–20)
BUN: 36 mg/dL — ABNORMAL HIGH (ref 6–20)
CALCIUM: 8.3 mg/dL — AB (ref 8.9–10.3)
CALCIUM: 8.3 mg/dL — AB (ref 8.9–10.3)
CO2: 24 mmol/L (ref 22–32)
CO2: 23 mmol/L (ref 22–32)
Chloride: 118 mmol/L — ABNORMAL HIGH (ref 101–111)
Chloride: 119 mmol/L — ABNORMAL HIGH (ref 101–111)
Creatinine, Ser: 1.03 mg/dL (ref 0.61–1.24)
Creatinine, Ser: 0.96 mg/dL (ref 0.61–1.24)
GLUCOSE: 143 mg/dL — AB (ref 65–99)
Glucose, Bld: 137 mg/dL — ABNORMAL HIGH (ref 65–99)
POTASSIUM: 3.4 mmol/L — AB (ref 3.5–5.1)
Potassium: 3.4 mmol/L — ABNORMAL LOW (ref 3.5–5.1)
SODIUM: 147 mmol/L — AB (ref 135–145)
Sodium: 148 mmol/L — ABNORMAL HIGH (ref 135–145)

## 2015-12-15 LAB — AEROBIC/ANAEROBIC CULTURE (SURGICAL/DEEP WOUND): GRAM STAIN: NONE SEEN

## 2015-12-15 MED ORDER — FAT EMULSION 20 % IV EMUL: 240.0000 mL | INTRAVENOUS | Status: DC

## 2015-12-15 MED ORDER — POTASSIUM CHLORIDE 10 MEQ/50ML IV SOLN: 10.0000 meq | INTRAVENOUS | Status: DC

## 2015-12-15 MED ORDER — CLINIMIX E/DEXTROSE (5/15) 5 % IV SOLN: INTRAVENOUS | Status: DC

## 2015-12-15 MED ORDER — TRACE MINERALS CR-CU-MN-SE-ZN 10-1000-500-60 MCG/ML IV SOLN
INTRAVENOUS | Status: AC
  Administered 2015-12-15: 17:00:00 via INTRAVENOUS
  Filled 2015-12-15 (×2): qty 1992

## 2015-12-15 MED ORDER — POTASSIUM CHLORIDE 10 MEQ/50ML IV SOLN
10.0000 meq | INTRAVENOUS | Status: AC
  Administered 2015-12-15 (×2): 10 meq via INTRAVENOUS
  Filled 2015-12-15 (×2): qty 50

## 2015-12-15 MED ORDER — FAT EMULSION 20 % IV EMUL
240.0000 mL | INTRAVENOUS | Status: AC
  Administered 2015-12-15: 240 mL via INTRAVENOUS
  Filled 2015-12-15 (×2): qty 250

## 2015-12-15 NOTE — Progress Notes (Signed)
eLink Physician-Brief Progress Note Patient Name: [name] DOB: August 15, 1940 MRN: [id number]   Date of Service  12/15/2015  HPI/Events of Note  LOW k  eICU Interventions  REPLETE K     Intervention Category Intermediate Interventions: Other:  Louann SjogrenJose Angelo A De Dios 12/15/2015, 6:36 AM

## 2015-12-15 NOTE — Progress Notes (Signed)
JP drain in RUQ put out 40cc of purulent drainage between 0800 and 1400 today. Therefore it was left in place.

## 2015-12-15 NOTE — Progress Notes (Signed)
[name]  [address] [place], [place] [address] Phone: [phone] FAX: [phone]   [name] [id number] 05-19-1940  CARE TEAM:  PCP: Irven Shelling, MD  Outpatient Care Team: Patient Care Team: Lavone Orn, MD as PCP - General (Internal Medicine) Michael Boston, MD as Consulting Physician (General Surgery) Arta Silence, MD as Consulting Physician (Gastroenterology)  Inpatient Treatment Team: Treatment Team: Attending Provider: Michael Boston, MD; Consulting Physician: Arta Silence, MD; Technician: Sueanne Margarita, NT; Consulting Physician: Md Pccm, MD; Rounding Team: Md Pccm, MD; Registered Nurse: Lynnell Dike, RN; Chaplain: Hadley Pen, [place]; Rounding Team: [place], MD; Registered Nurse: Kennyth Lose, RN; Speech Language Pathologist: Narda Rutherford, [place]  SURGERY 12/03/2015  POST-OPERATIVE DIAGNOSIS:    Peritonitis Jejunal disruption Gastric leak  PROCEDURE:    LAPAROSCOPY DIAGNOSTIC OMENTOPEXY of jejunal disruption [place] OUT x 21L with drains placement  SURGEON:  Michael Boston, MD   Post-Op 12/01/2015  POST-OPERATIVE DIAGNOSIS:    RECURRENT GASTRIC OUTLET OBSTRUCTION DUE to significant edema at gastroduodenal anastomosis. GASTRITIS Failure to thrive with malnutrition  PROCEDURE:   LAPAROSCOPIC PLACEMENT OF FEEDING JEJUNOSTOMY AND GASTROSTOMY TUBEs ESOPHAGOGASTRODUODENOSCOPY (EGD) BALLOON DILATION OF DUODENAL STRICTURE Gastric biopsies  Closure of gastrotomy  SURGEON:  Michael Boston, MD  Prior surgery 09/25/2015  POST-OPERATIVE DIAGNOSIS:  Partial gastric outlet obstruction due to chronic pyloric stricture  PROCEDURE:   XI ROBOTIC distal gastrectomy BILROTH I  ANASTOMOSIS  ANTERIOR AND POSTERIOR VAGOTOMY DOR (Anterior 825 degree) FUNDIPLICATION OMENTOPEXY UPPER ENDOSCOPY  SURGEON:  Michael Boston, MD   Problem List:   Principal  Problem:   Bile peritonitis due to gastric tube dislodgement Active Problems:   Essential hypertension, benign   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 09/25/2015   Hyperlipidemia   Anxiety state   Gastric outlet obstruction   Gastric Ileus    Encephalopathy acute   Endotracheally intubated   On mechanically assisted ventilation (HCC)   Protein-calorie malnutrition, severe   Acute respiratory failure with hypoxemia (HCC)   Tobacco abuse   Ischemic right index finger at tip   Gastritis   Gastric tube present (LUQ)   Jejunostomy tube present (LLQ)   Pneumatosis of intestines    Assessment/Plan:  GUARDED but recovering s/p patching at jejunum & resecuring Gtube & massive washout  -Pneumatosis & jejunal bowel wall thickening.  Lactate <2.   No SMA thrombosis.  No evidence of any embolic disease in the mesentery.  Off pressors.  WBC stable Suspect this is source of GL bleeding (NGT & Gtube bilious, not bloody.  EGD without bleeding in stomach/duodenum.   J tube output w old blood.  Jejunostomy tube replaced.  No perforation.  No extravasation of contrast around jejunostomy tube.   BMs less bloody & more c/w old blood.  Hold lovenox if Hgb <8  PSBO resolving.  Had BMs & lower NGT/Gtube/Jtube output = doubt complete obstruction.   2 collections in the abdomen drained seem serous  Remove LUQ lateral perisplenic collection since serous &  Cx neg & output low.  See if IR agrees  Retry TFs via J tube.   Low rate trophic.  There is concern by nursing that there is no good adapter for the current jejunostomy tube replaced by IR.  While it is more a narrow 49 [place], it should tolerate tube feeds.  Have asked them to discuss with interventional radiology to see if that can be done so we can start tube  feeds versus switching out to the jejunostomy tube that actually can feed.  I do not want the tube as a drainage tube only.  Less G-tube output and able to pass endoscopy  tube across the gastroduodenal Bilroth anastomosis signs the gastric outlet obstruction from inflammation is improving.  Lower Gtube output is hopeful sign that maybe his gastric ileus is resolving as well.  If he can tolerate trach collar trials, have speech therapy evaluate to rule out aspiration and see if he can try doing some drinking up to a pureed diet with the G-tube in place.  Eventually his stomach will wake up and improve.  Could do Reglan when necessary but would like to hold off on any promotility agents at this time I don't want to stress things out more.  TC trials to get off vent.  Sedation/Anxiolysis - challenge with pt that goes from sleeping to pulling at lines/tubes.  More calm a hopeful sign with Haldol, but guarded w pulling out another tube last weekend  I would not operate on this patient unless he goes under septic shock with high lactate, severe distention and peritonitis.  Extremely hostile abdomen with high risk of enterocutaneous fistula and breakdown of any small bowel resection.  Any bowel obstruction would be related to inflammation and adhesions and an early inflammatory stage.  Would manage conservatively anyway.  Having BMs = not complete SBO.  Continue proximal decompression with orogastric active LIWS suction, gastric tube to gravity (to keep pressure down but not actively suck around Gtube skin leak), jejunostomy tube back to gravity.  -IV nutrition TNA   -Inflammatory narrowing at gastroduodenal Bilroth I anastomosis.  The fact that there is bilious reflux into the stomach argues against a complete obstruction.  Skin care.  H2B for gastriris.  F/u Bx's for Hpylori neg.  Carafate via Gtube as tolerated.     -Follow off ABx.  CT scan PRN if worsens.  -VTE prophylaxis - SCDs.  Hold off on enoxaparin if Hgb<8 s or persistent bleeding.   Seems like the GI bleeding is tapering off.  -lowK - replacing  -brachial artery embolus to right index finger resolved.   D/w  Dr. Trula Slade with vascular surgery.   Duplex study argues against any more brachial clot with good blood supply.  Can come off heparin   Nicotene patch - apparently dips (pt denied to to me in past but  wife and daughter do confirm that he actually uses it moderately.  Patient's wife hopeful that a lot of this is nicotine withdrawal.  While I am skeptical, does not hurt to use that.  Certainly that can make gastritis/ulcer problems worse.    Disposition: He will definitetly require skilled care facility with rehabilitation capacity upon discharge from the hospital.  I suspect it's going to take at least 2 weeks if not several before he'll be ready for that transition.  His daughter expressed understanding and appreciation   I discussed operative findings, updated the patient's status, discussed probable steps to recovery, and gave postoperative recommendations to the patient and family.  ICU RNs.  Dr Lake Bells.  Discussion with critical-care nursing staff.  Recommendations were made.  Questions were answered.  They expressed understanding & appreciation.    [name], M.D., F.A.C.S. Gastrointestinal and Minimally Invasive Surgery [facility] [address] [place], [place] [address] [phone] Main / Paging    12/15/2015   Subjective:  More calm last 48 hours Shook my hand  Brother-in-law in room   ICU RN in room  Objective:  Vital signs:  Vitals:   12/15/15 0400 12/15/15 0412 12/15/15 0415 12/15/15 0500  BP: (!) 153/55  (!) 180/70   Pulse:   100   Resp: (!) 22  20 18   Temp:   99.6 F (37.6 C)   TempSrc:   Oral   SpO2: 100%  100% 100%  Weight:  86.2 kg (190 lb 0.6 oz)    Height:        Last BM Date: 12/14/15  Intake/Output   Yesterday:  10/30 0701 - 10/31 0700 In: 4111.2 [I.V.:2480.2; IV ZWCHENIDP:824] Out: 1935 [Urine:1000; Drains:435] This shift:  No intake/output data recorded.  Bowel function:  Flatus: YES  BM:   YES - bloody - more old blood  Drain: Gtube thick dark bile - no blood.  scant Blake drains all serous - no bile tinge Jtube old blood & bilious Gtube bilious NGT bilious    Physical Exam:  General: Pt awake/alert.  Following commands but occ confused.   mild acute distress at best.   Eyes: PERRL, normal EOM.  Sclera clear.  No icterus Neuro: CN II-XII intact w/o focal sensory/motor deficits. Lymph: No head/neck/groin lymphadenopathy Psych:  No delerium/psychosis/paranoia HENT: Normocephalic, Mucus membranes moist.  No thrush. ETT in place. Neck: Supple, No tracheal deviation Chest: CTAB. Chest wall pain w good excursion CV:  Pulses intact.  Regular rhythm MS: Normal AROM mjr joints.  No obvious deformity Abdomen:  G tube LUQ.  Rash around skin better with scant drainage, some fullness around the region of the gastrostomy tube but no fluctuance.  J tube in LLQ - skin clean  Softer.  Nondistended.  Tenderness at G tube site & LLQ.  No pain RLQ/RUQ/epigastric. GU:  Foley in place.  NEMG Ext:  SCDs BLE.  No mjr edema.  Right index finger unchanged - distal pad & nail bed with cyanosis.  Good cap refill to nail bed  Skin: 1+ anasarca.  No other petechiae / purpura  Results:   Diagnosis Stomach, biopsy - CHRONIC FOCALLY ACTIVE GASTRITIS. - THERE IS NO EVIDENCE OF HELICOBACTER PYLORI, DYSPLASIA OR MALIGNANCY. - SEE COMMENT. Microscopic Comment A Warthin-Starry stain is negative for the presence of Helicobacter pylori organisms. (JBK:gt, 12/04/15) Enid Cutter MD Pathologist, Electronic Signature (Case signed 12/04/2015) Specimen Denym Rahimi and Clinical Information Specimen(s) Obtained: Stomach, biopsy Specimen Clinical  Labs: Results for orders placed or performed during the hospital encounter of 11/24/15 (from the past 48 hour(s))  Glucose, capillary     Status: Abnormal   Collection Time: 12/13/15  7:58 AM  Result Value Ref Range   Glucose-Capillary 103 (H) 65 - 99 mg/dL    Comment 1 Notify RN    Comment 2 Document in Chart   Glucose, capillary     Status: Abnormal   Collection Time: 12/13/15 12:04 PM  Result Value Ref Range   Glucose-Capillary 159 (H) 65 - 99 mg/dL  CBC     Status: Abnormal   Collection Time: 12/13/15  2:02 PM  Result Value Ref Range   WBC 9.8 4.0 - 10.5 K/uL   RBC 2.91 (L) 4.22 - 5.81 MIL/uL   Hemoglobin 8.6 (L) 13.0 - 17.0 g/dL   HCT 25.0 (L) 39.0 - 52.0 %   MCV 85.9 78.0 - 100.0 fL   MCH 29.6 26.0 - 34.0 pg   MCHC 34.4 30.0 - 36.0 g/dL   RDW 15.9 (H) 11.5 - 15.5 %   Platelets 553 (H)  150 - 400 K/uL  Protime-INR     Status: Abnormal   Collection Time: 12/13/15  2:02 PM  Result Value Ref Range   Prothrombin Time 15.9 (H) 11.4 - 15.2 seconds   INR 1.26   APTT     Status: Abnormal   Collection Time: 12/13/15  2:02 PM  Result Value Ref Range   aPTT 38 (H) 24 - 36 seconds    Comment:        IF BASELINE aPTT IS ELEVATED, SUGGEST PATIENT RISK ASSESSMENT BE USED TO DETERMINE APPROPRIATE ANTICOAGULANT THERAPY.   Glucose, capillary     Status: Abnormal   Collection Time: 12/13/15  5:08 PM  Result Value Ref Range   Glucose-Capillary 151 (H) 65 - 99 mg/dL  Glucose, capillary     Status: Abnormal   Collection Time: 12/13/15  7:43 PM  Result Value Ref Range   Glucose-Capillary 136 (H) 65 - 99 mg/dL  Glucose, capillary     Status: Abnormal   Collection Time: 12/13/15 11:49 PM  Result Value Ref Range   Glucose-Capillary 150 (H) 65 - 99 mg/dL  Glucose, capillary     Status: Abnormal   Collection Time: 12/14/15  4:17 AM  Result Value Ref Range   Glucose-Capillary 149 (H) 65 - 99 mg/dL  Prealbumin     Status: Abnormal   Collection Time: 12/14/15  5:49 AM  Result Value Ref Range   Prealbumin 9.2 (L) 18 - 38 mg/dL    Comment: Performed at [facility]  Magnesium     Status: None   Collection Time: 12/14/15  5:49 AM  Result Value Ref Range   Magnesium 2.1 1.7 - 2.4 mg/dL  Phosphorus     Status: None   Collection Time:  12/14/15  5:49 AM  Result Value Ref Range   Phosphorus 3.2 2.5 - 4.6 mg/dL  CBC     Status: Abnormal   Collection Time: 12/14/15  5:49 AM  Result Value Ref Range   WBC 7.7 4.0 - 10.5 K/uL   RBC 3.49 (L) 4.22 - 5.81 MIL/uL   Hemoglobin 10.3 (L) 13.0 - 17.0 g/dL   HCT 31.0 (L) 39.0 - 52.0 %   MCV 88.8 78.0 - 100.0 fL   MCH 29.5 26.0 - 34.0 pg   MCHC 33.2 30.0 - 36.0 g/dL   RDW 16.3 (H) 11.5 - 15.5 %   Platelets 516 (H) 150 - 400 K/uL  Differential     Status: Abnormal   Collection Time: 12/14/15  5:49 AM  Result Value Ref Range   Neutrophils Relative % 67 %   Neutro Abs 5.1 1.7 - 7.7 K/uL   Lymphocytes Relative 11 %   Lymphs Abs 0.9 0.7 - 4.0 K/uL   Monocytes Relative 21 %   Monocytes Absolute 1.6 (H) 0.1 - 1.0 K/uL   Eosinophils Relative 1 %   Eosinophils Absolute 0.1 0.0 - 0.7 K/uL   Basophils Relative 0 %   Basophils Absolute 0.0 0.0 - 0.1 K/uL  Triglycerides     Status: Abnormal   Collection Time: 12/14/15  5:49 AM  Result Value Ref Range   Triglycerides 194 (H) <150 mg/dL    Comment: Performed at [facility]  Comprehensive metabolic panel     Status: Abnormal   Collection Time: 12/14/15  5:49 AM  Result Value Ref Range   Sodium 149 (H) 135 - 145 mmol/L   Potassium 3.4 (L) 3.5 - 5.1 mmol/L   Chloride 120 (H) 101 -  111 mmol/L   CO2 25 22 - 32 mmol/L   Glucose, Bld 148 (H) 65 - 99 mg/dL   BUN 37 (H) 6 - 20 mg/dL   Creatinine, Ser 1.07 0.61 - 1.24 mg/dL   Calcium 8.1 (L) 8.9 - 10.3 mg/dL   Total Protein 5.4 (L) 6.5 - 8.1 g/dL   Albumin 1.9 (L) 3.5 - 5.0 g/dL   AST 23 15 - 41 U/L   ALT 19 17 - 63 U/L   Alkaline Phosphatase 118 38 - 126 U/L   Total Bilirubin 2.0 (H) 0.3 - 1.2 mg/dL   GFR calc non Af Amer >60 >60 mL/min   GFR calc Af Amer >60 >60 mL/min    Comment: (NOTE) The eGFR has been calculated using the CKD EPI equation. This calculation has not been validated in all clinical situations. eGFR's persistently <60 mL/min signify possible Chronic  Kidney Disease.    Anion gap 4 (L) 5 - 15  Glucose, capillary     Status: Abnormal   Collection Time: 12/14/15  7:28 AM  Result Value Ref Range   Glucose-Capillary 145 (H) 65 - 99 mg/dL  Glucose, capillary     Status: Abnormal   Collection Time: 12/14/15 11:18 AM  Result Value Ref Range   Glucose-Capillary 132 (H) 65 - 99 mg/dL  Glucose, capillary     Status: Abnormal   Collection Time: 12/14/15  4:29 PM  Result Value Ref Range   Glucose-Capillary 133 (H) 65 - 99 mg/dL  Glucose, capillary     Status: Abnormal   Collection Time: 12/14/15  7:31 PM  Result Value Ref Range   Glucose-Capillary 128 (H) 65 - 99 mg/dL  Glucose, capillary     Status: Abnormal   Collection Time: 12/15/15 12:18 AM  Result Value Ref Range   Glucose-Capillary 147 (H) 65 - 99 mg/dL  Glucose, capillary     Status: Abnormal   Collection Time: 12/15/15  4:05 AM  Result Value Ref Range   Glucose-Capillary 131 (H) 65 - 99 mg/dL  Basic metabolic panel     Status: Abnormal   Collection Time: 12/15/15  4:24 AM  Result Value Ref Range   Sodium 147 (H) 135 - 145 mmol/L   Potassium 3.4 (L) 3.5 - 5.1 mmol/L   Chloride 118 (H) 101 - 111 mmol/L   CO2 24 22 - 32 mmol/L   Glucose, Bld 143 (H) 65 - 99 mg/dL   BUN 35 (H) 6 - 20 mg/dL   Creatinine, Ser 1.03 0.61 - 1.24 mg/dL   Calcium 8.3 (L) 8.9 - 10.3 mg/dL   GFR calc non Af Amer >60 >60 mL/min   GFR calc Af Amer >60 >60 mL/min    Comment: (NOTE) The eGFR has been calculated using the CKD EPI equation. This calculation has not been validated in all clinical situations. eGFR's persistently <60 mL/min signify possible Chronic Kidney Disease.    Anion gap 5 5 - 15  CBC with Differential/Platelet     Status: Abnormal   Collection Time: 12/15/15  4:24 AM  Result Value Ref Range   WBC 10.2 4.0 - 10.5 K/uL   RBC 2.96 (L) 4.22 - 5.81 MIL/uL   Hemoglobin 8.8 (L) 13.0 - 17.0 g/dL   HCT 26.4 (L) 39.0 - 52.0 %   MCV 89.2 78.0 - 100.0 fL   MCH 29.7 26.0 - 34.0 pg   MCHC  33.3 30.0 - 36.0 g/dL   RDW 16.0 (H) 11.5 - 15.5 %  Platelets 633 (H) 150 - 400 K/uL   Neutrophils Relative % 68 %   Lymphocytes Relative 12 %   Monocytes Relative 19 %   Eosinophils Relative 1 %   Basophils Relative 0 %   Neutro Abs 7.0 1.7 - 7.7 K/uL   Lymphs Abs 1.2 0.7 - 4.0 K/uL   Monocytes Absolute 1.9 (H) 0.1 - 1.0 K/uL   Eosinophils Absolute 0.1 0.0 - 0.7 K/uL   Basophils Absolute 0.0 0.0 - 0.1 K/uL   WBC Morphology DOHLE BODIES     Imaging / Studies: No results found.  Medications / Allergies: per chart  Antibiotics: Anti-infectives    Start     Dose/Rate Route Frequency Ordered Stop   12/09/15 1200  anidulafungin (ERAXIS) 100 mg in sodium chloride 0.9 % 100 mL IVPB     100 mg over 90 Minutes Intravenous Every 24 hours 12/09/15 0918 12/10/15 1333   12/05/15 1200  anidulafungin (ERAXIS) 100 mg in sodium chloride 0.9 % 100 mL IVPB  Status:  Discontinued     100 mg over 90 Minutes Intravenous Every 24 hours 12/04/15 1045 12/09/15 0835   12/04/15 1200  anidulafungin (ERAXIS) 200 mg in sodium chloride 0.9 % 200 mL IVPB     200 mg over 180 Minutes Intravenous  Once 12/04/15 1045 12/04/15 1725   12/04/15 0600  cefoTEtan (CEFOTAN) 2 g in dextrose 5 % 50 mL IVPB     2 g 100 mL/hr over 30 Minutes Intravenous On call to O.R. 12/03/15 1821 12/03/15 2007   12/04/15 0400  piperacillin-tazobactam (ZOSYN) IVPB 3.375 g     3.375 g 12.5 mL/hr over 240 Minutes Intravenous Every 8 hours 12/03/15 2229 12/15/15 0959   12/02/15 0000  erythromycin ethylsuccinate (EES) 200 MG/5ML suspension 400 mg  Status:  Discontinued    Comments:  Administer via J tube (16Fr LLQ)   400 mg Oral Every 6 hours 12/01/15 2020 12/03/15 0748   11/27/15 1200  erythromycin 500 mg in sodium chloride 0.9 % 100 mL IVPB     500 mg 100 mL/hr over 60 Minutes Intravenous Every 6 hours 11/27/15 0940 11/29/15 0608   11/26/15 0830  erythromycin 250 mg in sodium chloride 0.9 % 100 mL IVPB  Status:  Discontinued     250  mg 100 mL/hr over 60 Minutes Intravenous Every 6 hours 11/26/15 0751 11/27/15 0940        Note: Portions of this report may have been transcribed using voice recognition software. Every effort was made to ensure accuracy; however, inadvertent computerized transcription errors may be present.   Any transcriptional errors that result from this process are unintentional.    [name], M.D., F.A.C.S. Gastrointestinal and Minimally Invasive Surgery [facility] [address] [place], [place] [address] [phone] Main / Paging     12/15/2015

## 2015-12-15 NOTE — Progress Notes (Signed)
PCCM PROGRESS NOTE  Admission date: 11/24/2015 Consult date: 12/03/2015 Referring provider: Dr. Michaell CowingGross  CC: Abdominal pain  BRIEF:  75 y/o male with peptic ulcer disease admitted on 10/10 with recurrent gastric outlet obstruction secondary to gastritis admitted underwent G and J tube placement this admission.  On 10/19 had to go back to the OR after he removed his G tube accidentally leading to peritonitis, intubation, septic shock.  Subjective:  More awake, alert Had maroon bowel movement x3 overnight EGD yesterday > mild esophagitis, evidence of billrothI, gastric pouch, non-bleeding duodenal ulcers, non-bleeding jejunal ulcers, could not reach j - tube  Vital signs: BP (!) 185/61 (BP Location: Right Arm)   Pulse 98   Temp 98.2 F (36.8 C) (Oral)   Resp 17   Ht 6\' 2"  (1.88 m)   Wt 190 lb 0.6 oz (86.2 kg)   SpO2 92%   BMI 24.40 kg/m   Intake/output: I/O last 3 completed shifts: In: 4626.2 [I.V.:3510.2; Other:160; NG/GT:240; IV Piggyback:716] Out: 3935 [Urine:2400; Emesis/NG output:300; Drains:735; Other:500]   Vent settings: Vent Mode: PRVC FiO2 (%):  [28 %-30 %] 28 % Set Rate:  [14 bmp] 14 bmp Vt Set:  [660 mL] 660 mL PEEP:  [5 cmH20] 5 cmH20 Plateau Pressure:  [15 cmH20-17 cmH20] 15 cmH20  General: chronically ill appearing, but more awake and alert today, interactive HEENT: NCAT, #6 perc trach in place. Janina Mayorach site is clean PULM: some upper airway rhonchi today, normal effort CV: RRR, few irregular beats ABD: Abdominal binder in place MSK: normal bulk and tone Derm: anasarca, thin skin Neuro: Awake, alert, speech clear  CMP Latest Ref Rng & Units 12/15/2015 12/14/2015 12/13/2015  Glucose 65 - 99 mg/dL 696(E143(H) 952(W148(H) 413(K156(H)  BUN 6 - 20 mg/dL 44(W35(H) 10(U37(H) 72(Z37(H)  Creatinine 0.61 - 1.24 mg/dL 3.661.03 4.401.07 3.471.15  Sodium 135 - 145 mmol/L 147(H) 149(H) 147(H)  Potassium 3.5 - 5.1 mmol/L 3.4(L) 3.4(L) 3.5  Chloride 101 - 111 mmol/L 118(H) 120(H) 120(H)  CO2 22 - 32 mmol/L  24 25 23   Calcium 8.9 - 10.3 mg/dL 8.3(L) 8.1(L) 8.0(L)  Total Protein 6.5 - 8.1 g/dL - 5.4(L) 5.3(L)  Total Bilirubin 0.3 - 1.2 mg/dL - 2.0(H) 2.2(H)  Alkaline Phos 38 - 126 U/L - 118 121  AST 15 - 41 U/L - 23 24  ALT 17 - 63 U/L - 19 20    CBC Latest Ref Rng & Units 12/15/2015 12/14/2015 12/13/2015  WBC 4.0 - 10.5 K/uL 10.2 7.7 9.8  Hemoglobin 13.0 - 17.0 g/dL 4.2(V8.8(L) 10.3(L) 8.6(L)  Hematocrit 39.0 - 52.0 % 26.4(L) 31.0(L) 25.0(L)  Platelets 150 - 400 K/uL 633(H) 516(H) 553(H)   ABG    Component Value Date/Time   PHART 7.344 (L) 12/07/2015 2230   PCO2ART 31.4 (L) 12/07/2015 2230   PO2ART 132 (H) 12/07/2015 2230   HCO3 16.7 (L) 12/07/2015 2230   TCO2 26 12/04/2007 0638   ACIDBASEDEF 7.6 (H) 12/07/2015 2230   O2SAT 98.3 12/07/2015 2230   CBG (last 3)   Recent Labs  12/15/15 0018 12/15/15 0405 12/15/15 0816  GLUCAP 147* 131* 127*   Imaging: Ir Replc Duoden/jejuno Tube Percut W/fluoro  Result Date: 12/15/2015 INDICATION: Jejunostomy tube pulled out EXAM: IR REPLACE DUODEN/JEJUNO TUBE PERCUT WITH FLOURO MEDICATIONS: None ANESTHESIA/SEDATION: None CONTRAST:  15mL ISOVUE-300 IOPAMIDOL (ISOVUE-300) INJECTION 61% - administered into the gastric lumen. FLUOROSCOPY TIME:  Fluoroscopy Time: 1 minutes 30 seconds (11.4 mGy). COMPLICATIONS: None immediate. PROCEDURE: Informed written consent was obtained from the patient after  a thorough discussion of the procedural risks, benefits and alternatives. All questions were addressed. Maximal Sterile Barrier Technique was utilized including caps, mask, sterile gowns, sterile gloves, sterile drape, hand hygiene and skin antiseptic. A timeout was performed prior to the initiation of the procedure. The jejunostomy tube was completely pulled out. A Kumpe catheter was advanced into the jejunostomy tube entry site. Contrast was injected. The catheter was advanced over a Bentson into the loop of jejunum. The Kumpe be was exchanged over a Bentson for a  12 French pigtail catheter. This was advanced into the jejunum, looped, and string fixed, then sewn to the skin. Contrast was injected. It was then attached to a gravity drainage bag. FINDINGS: Imaging confirms replacement of the jejunostomy tube in the left lower quadrant as described. IMPRESSION: Successful left lower quadrant jejunostomy tube replacement. Electronically Signed   By: Jolaine ClickArthur  Hoss M.D.   On: 12/15/2015 08:19  PCXR w/ minimal L base atx. Aeration improved. Trach good position.   Studies: 10/11 CT abdomen/pelvis> significant distension of the stomach with layering fluid and gas, NG present, worrisome for recurrent gastric outlet obstruction.  RLL consolidation, cholecycstectomy 10/26 CT abdomen/pelvis> Moderate dilatation of proximal and mid small bowel loops, with transition point in anterior right lower quadrant, suspicious for partial small bowel obstruction. Pneumatosis involving multiple dilated small bowel loops in the left abdomen pelvis, suspicious for bowel ischemia. No evidence of portal venous gas or free intraperitoneal air. Two left upper quadrant fluid collections adjacent to the stomach and spleen, which may represent postoperative fluid collections or abscesses. Small amount free fluid also noted in pelvis.Small bilateral pleural effusions and bibasilar atelectasis. Diffuse mesenteric and body wall edema.  Cultures: Blood 10/19 >> neg Fluid cx 10/17 >> pending  Antibiotics: Zosyn 10/19 >>  Anidulafungin 10/19 >> 10/26   Events: 10/10  admission 10/17  Lap placement of J & G tubes, EGD w/ balloon dilation of duodenal stricture, gastric biopsies, closure of gastrotomy 10/18  sundowning, pulled out G tube 10/19  laparaoscopy (findings gastric leak, bilious ascites, jejunal disruption), omentoplexy of jejunal disruption, gastric leak 10/24 extubated. Required re-intubation.  10/25: placed on precedex for acute encephalopathy; new drainage from J tube. tubefeeds  stopped.   10/26 Heparin off since this am (as recommended by vascular). Still on pressors. Getting albumin and lasix to a/w mobilizing third spaced fluid/anasarca. Developed rectal bleeding. This has initiated after starting bowel prep. Hgb has drifted from 8 to 7.7 --> stopped [place] heparin. CT found evidence of bowel pneumotosis worrisome for ischemia, and two LUQ fluid collections 10/27: trach placed. TO IR for Perc drains to be placed in LUQ 10/30 EGD : mild esophagitis, evidence of billroth procedure, gastric pouch, non-bleeding duodenal ulcers, non-bleeding jejunal ulcers, could not reach j - tube   Lines/tube: 10/19 ETT >> 10/24 then replaced 10/24 > 10/27 10/19 CVL left subclavian >>  10/19 R brachial arterial line >> 10/20 10/19 abdominal drain x3 >> 10/19 G tube >> 10/19 J tube >> 10/27 Trach placed (feinstein # 6 cuffed), Abd drains by IR 10/29 J dislodged, replaced by IR  Summary: 75 yo male former smoker with progressive epigastric pain, nausea, vomiting, bloating from gastric outlet obstruction with pyloric channel ulcer.  Had Billroth 1, vagotomy, fundoplication, omentopexy August 2017 with initial improvement.  Had recurrent symptoms September 2017 that become progressively worse with gastric dilation from recurrent stricture.  Had laparoscopic placement of jejunostomy tube, gastrostomy tube, and EGD balloon dilation of duodenal stricture 10/17.  Developed  gastric leakage with peritonitis after pulling out PEG tube 10/19 and taken to OR. On 10/27 found 2 new LUQ abscesses/fluid collections which were drained percutaneously by IR. Had perc trach 10/27.  As of 10/30 his mental status has improved, he has remained off of vasopressors for several days and he has done well with weaning off the ventilator. PMHx of CAD s/p stent, GERD, HTN, HLD  Assessment/plan:  Acute respiratory failure > improving Tracheostomy (placed 10/27) due prolonged critical illness, deconditioning and severe  protein calorie malnutrition.  - tracheostomy collar trial today > attempt 24 hours - speaking valve evaluation if he does well today - Cont routine trach care   Recurrent gastric outlet obstruction Recurrent GI bleed with new oozing from J tube GI bleeding> source inflamed small bowel? Abdominal abscess/fluid collections s/p IR drains Protein-calorie malnutrition - nutrition per GI surgery - TPN per surgery - agree with monitoring off of antibiotics - Follow fluid cx from IR drainage  AKI >> improved  Hypokalemia  Hyperchloremia  - monitor renal function, urine outpt - renal adjust meds  - replaced electrolytes as needed  Acute metabolic encephalopathy > minimal Post-op pain Severe Physical deconditioning  - PRN fent for pain - haldol prn for severe agitation  - continue scheduled haldol, monitor QTc on tele monitor  Rt index finger ischemia likely from Rt brachial a line. > improved, no clot on upper ext doppler ultrasound - VVS consulted 10/20, appreciate input-->resolved  - no more Arterial lines right arm  Hyperglycemia - SSI  Anasarca - cont nutritional support    SUP - Famotidine IV DVT prophylaxis - SCDs Goals of care - Full code  My cc time 35 minutes  Heber [place], MD [facility] Pager: [phone] Cell: [phone] After 3pm or if no response, call [phone]  12/15/2015, 8:59 AM

## 2015-12-15 NOTE — Progress Notes (Signed)
eLink Physician-Brief Progress Note Patient Name: Danny GalRalph T [name] DOB: September 24, 1940 MRN: [id number]   Date of Service  12/15/2015  HPI/Events of Note  17 beat VT, asymptomatic. K+ was 3.4 this morning  eICU Interventions  Stat BMET ordered     Intervention Category Major Interventions: Arrhythmia - evaluation and management  Merwyn KatosDavid B Simonds 12/15/2015, 10:11 PM

## 2015-12-15 NOTE — Evaluation (Addendum)
[facility] Speaking Valve - Evaluation Patient Details  Name: [name] MRN: [id number] Date of Birth: 09/29/1940  Today's Date: 12/15/2015 Time: 1210-1240 SLP Time Calculation (min) (ACUTE ONLY): 30 min  Past Medical History:  Past Medical History:  Diagnosis Date  . Bilateral dry eyes   . Coronary artery disease 1997   bare mental stent right coronart artery  . Degenerative disc disease    LOWER BACK  . Dysphonia   . ED (erectile dysfunction)   . Gastroesophageal reflux disease   . Hyperlipidemia   . Hypertension   . Peptic ulcer   . Pneumonia    HISTORY OF IN CHILDHOOD  . Posterior vitreous detachment 10/30/2012  . Urinary hesitancy   . Wears glasses    Past Surgical History:  Past Surgical History:  Procedure Laterality Date  . CARDIAC CATHETERIZATION    . CHOLECYSTECTOMY    . ESOPHAGOGASTRODUODENOSCOPY N/A 12/01/2015   Procedure: ESOPHAGOGASTRODUODENOSCOPY (EGD) BALLOON DILATION OF DUODENAL STRICTURE;  Surgeon: Karie SodaSteven Gross, MD;  Location: [facility];  Service: General;  Laterality: N/A;  . ESOPHAGOGASTRODUODENOSCOPY (EGD) WITH PROPOFOL N/A 07/01/2015   Procedure: ESOPHAGOGASTRODUODENOSCOPY (EGD) WITH PROPOFOL;  Surgeon: Willis ModenaWilliam Outlaw, MD;  Location: [facility];  Service: Endoscopy;  Laterality: N/A;  . ESOPHAGOGASTRODUODENOSCOPY (EGD) WITH PROPOFOL N/A 11/23/2015   Procedure: ESOPHAGOGASTRODUODENOSCOPY (EGD) WITH PROPOFOL;  Surgeon: Willis ModenaWilliam Outlaw, MD;  [facility];  Service: Endoscopy;  Laterality: N/A;  may need to intubate  . EYE SURGERY     growth on right eye removed   . IR GENERIC HISTORICAL  12/13/2015   IR REPLC DUODEN/JEJUNO TUBE PERCUT W/FLUORO 12/13/2015 Jolaine ClickArthur Hoss, MD [facility]-INTERV RAD  . LAPAROSCOPIC GASTROSTOMY N/A 12/01/2015   Procedure: LAPAROSCOPIC PLACEMENT OF FEEDING JEJUNOSTOMY AND GASTROSTOMY TUBE;  Surgeon: Karie SodaSteven Gross, MD;  Location: [facility];  Service: General;  Laterality: N/A;  . LAPAROSCOPY N/A 12/03/2015   Procedure:  LAPAROSCOPY DIAGNOSTIC, OMENTOPEXY, JEJUNOSTOMY, WASH OUT;  Surgeon: Karie SodaSteven Gross, MD;  Location: [facility];  Service: General;  Laterality: N/A;  . STENT TO HEART     2 1997 AND 1 IN 1996  . TONSILLECTOMY    . XI ROBOTIC VAGOTOMY AND ANTRECTOMY N/A 09/25/2015   Procedure: XI ROBOTIC ANTERIOR AND POSTERIOR VAGOTOMY, BILROTH I  ANASTOMOSIS DOR FUNDIPLICATION, OMENTOPEXY  UPPER ENDOSCOPY;  Surgeon: Karie SodaSteven Gross, MD;  Location: [facility];  Service: General;  Laterality: N/A;   HPI:  75 yo male former smoker with progressive epigastric pain, nausea, vomiting, bloating from gastric outlet obstruction with pyloric channel ulcer.  Had Billroth 1, vagotomy, fundoplication, omentopexy August 2017 with initial improvement.  Had recurrent symptoms September 2017 that become progressively worse with gastric dilation from recurrent stricture.  Had laparoscopic placement of jejunostomy tube, gastrostomy tube, and EGD balloon dilation of duodenal stricture 10/17.  Developed gastric leakage with peritonitis after pulling out PEG tube 10/19 and taken to OR. On 10/27 found 2 new LUQ abscesses/fluid collections which were drained percutaneously by IR. Had perc trach 10/27.  As of 10/30 his mental status has improved, he has remained off of vasopressors for several days and he has done well with weaning off the ventilator per MD note.  PMSV ordered. Assessment / Plan / Recommendation Clinical Impression  Pt with great tolerance of PMSV allowing him to speak and expectorate secretions orally.  No indications of airway compromise, breath stacking with pmsv - all vitals stable during 28 minute trial.  Pt did demonstrate gurgly vocal quality but cued throat clear/expectoration effective to clear.  Voice  was mildly weak with pt having mild dysarthria impacting speech intelligibility.  Of note, SLP saw pt in August for BSE when he was in hospital for surgery = concerns for rapid rate of imprecise speech and wife reported drooling for 1  1/2 years prompted this SlP to recommend follow up with neurologist on OP basis.  Wife states pt was scheduled with Dr Anne HahnWillis but had not been able to make appointment due to being ill.  Recommend pt use valve throughout the day with full supervision as tolerated.  Wife and pt educated to findings using teach back and pt demonstrated donning and removing of valve.  SLP to follow up for PMSV for  tolerance.       SLP Assessment  Patient needs continued Speech Lanaguage Pathology Services    Follow Up Recommendations   TBD    Frequency and Duration min 2x/week  2 weeks    PMSV Trial PMSV was placed for: communication, secretion management - placed for 28 minutes Able to redirect subglottic air through upper airway: Yes Able to Attain Phonation: Yes Voice Quality: Normal Able to Expectorate Secretions: Yes Level of Secretion Expectoration with PMSV: Oral Breath Support for Phonation: Adequate Intelligibility: Intelligibility reduced Word: 75-100% accurate Phrase: 75-100% accurate Sentence: 75-100% accurate Respirations During Trial: 25 (22-28) SpO2 During Trial: 100 % (95-100) Pulse During Trial: 101 (91-105) Behavior: Alert;Good eye contact;Expresses self well;Responsive to questions   Tracheostomy Tube    Shiley cuffed, 6   Vent Dependency  FiO2 (%): 28 %    Cuff Deflation Trial  GO Tolerated Cuff Deflation: Yes Length of Time for Cuff Deflation Trial: cuff was already deflated upon slp entrance to room for evaluation, pt able to move air around trach tube for speaking even without valve Behavior: Alert;Expresses self well;Good eye contact        Danny Burnetamara Kathaleya Mcduffee, MS [facility] SLP [phone]

## 2015-12-15 NOTE — Progress Notes (Signed)
[place] [facility] NOTE   Pharmacy Consult for TPN  Patient Measurements: Height: 6' 2"  (188 cm) Weight: 190 lb 0.6 oz (86.2 kg) IBW/kg (Calculated) : 82.2 TPN AdjBW (KG): 97.2 Body mass index is 24.4 kg/m.  Insulin Requirements: 7 units Novolog in past 24 hours  Current Nutrition: NPO  IVF: NS at 10 ml/hr  Central access: PICC placed 10/12, pt pulled out 10/19. CVC in place since 10/19 TPN date: 10/12-10/19, resumed 10/25  ASSESSMENT                                                                                                          HPI: 59 yoM admitted 10/9 with new upper abdominal discomfort and bloating with concern for recurrent GOO due to chronic pyloric stricture. 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  Pharmacy consulted to start TPN for gastric ileus and obstruction 10/12. Patient underwent G and J tube placement this admission. On 10/19, patient pulled out PICC line. He also had to go back to OR after he removed his G tube accidentally, leading to peritonitis, intubation, and septic shock. Tube feed rate slowly advanced by CCS, but today patient noted to have dark brown output around J tube site, and tube feeds held. Pharmacy asked to resume TPN.   10/30: EGD - esophagitis, edema/erythematous stomach, stenosed Billroth I gastroduodenostomy,  Gastric pouch w/ ulceration, gastroduodenal anastomosis with ulceration, non-bleeding ulcers found in duodenum and jejunum.   Today, 12/15/15:   Glucose - No hx DM noted. CBGs at goal <150.  6 units novolog SSI/24h  Electrolytes - K 3.4 (2 runs KCl ordered), Na and Cl- high (but improved), all other lytes including Corrected Ca WNL  Renal - AKI -resolved, I/O: + 2.16L/24 (drains 427m out)  LFTs - AST/ALT, Alk Phos, Tbili WNL  TGs -76 (10/13), 97 (10/16), 214 (10/26), 194 (10/30)  Prealbumin - 17.8 (10/11), 12.8 (10/13), 11 (10/16),  12.5 (10/18), < 5 (10/23), 6.4 (10/26), 9.2 (10/30)  Orders to start trickle TF 10/30 but but able to give via J-tube as only for drainage per IR rec.  CCS wants to feed via J-tube so IR to be contacted to resolve issue  NUTRITIONAL GOALS                                                                                             RD recs (as of 10/27): KKGUR:4270Protein:111-138 grams (1.2-1.5 grams/kg)  Clinimix 5/15 at a goal rate of 988mhr would provide 108g/day protein, 2013Kcal/day.  **There is currently a naPsychologist, prison and probation servicesf Clinimix solution. No 2L bags of Clinimix E 5/20 currently available. To  conserve supply, will consider goal rate of Clinimix 5/15 at 83 ml/hr + 20% fat emulsion at 48m/hr, which will keep total volume at 2L rather than increasing use to 3L.   PLAN                                                                                           At 1800 today:  Continue Clinimix E 5/15 to 83 ml/hr (max rate).   20% fat emulsion at 10 ml/hr.  TPN to contain standard multivitamins and trace elements.  IVF per MD.  Continue sensitive SSI q4h.   TPN lab panels on Mondays & Thursdays.    F/u daily.   DDoreene Eland PharmD, BCPS.   Pager: [phone]/31/2017 7:12 AM

## 2015-12-15 NOTE — Progress Notes (Signed)
Referring Physician(s): Dr. Michaell Cowing  Supervising Physician: Gilmer Mor  Patient Status:  [facility] - In-pt  Chief Complaint:  F/U Drains and J-tube  Subjective:  [name] is awake, sitting up in bed. Family member at bedside.  No complaints.  Allergies: Flomax [tamsulosin hcl]; Lisinopril; Lorazepam; and Uroxatral [alfuzosin hcl er]  Medications: Prior to Admission medications   Medication Sig Start Date End Date Taking? Authorizing Provider  ALPRAZolam Prudy Feeler) 0.5 MG tablet Take 0.5 mg by mouth at bedtime as needed for sleep or anxiety. 10/02/15  Yes Historical Provider, MD  aspirin EC 81 MG tablet Take 81 mg by mouth every evening.    Yes Historical Provider, MD  atorvastatin (LIPITOR) 40 MG tablet Take 40 mg by mouth at bedtime.    Yes Historical Provider, MD  calcium-vitamin D (OSCAL WITH D) 500-200 MG-UNIT tablet Take 1 tablet by mouth daily with breakfast.   Yes Historical Provider, MD  Cyanocobalamin (VITAMIN B-12) 1000 MCG SUBL Place 1 tablet under the tongue daily with breakfast.   Yes Historical Provider, MD  Docusate Calcium (STOOL SOFTENER PO) Take 1 capsule by mouth daily as needed (for constipation).    Yes Historical Provider, MD  Multiple Vitamin (MULTIVITAMIN) tablet Take 2 tablets by mouth daily with breakfast.    Yes Historical Provider, MD  nitroGLYCERIN (NITROSTAT) 0.4 MG SL tablet Place 0.4 mg under the tongue every 5 (five) minutes as needed for chest pain.   Yes Historical Provider, MD  pantoprazole (PROTONIX) 40 MG tablet Take 40 mg by mouth 2 (two) times daily. 06/02/15  Yes Historical Provider, MD  Polyvinyl Alcohol-Povidone (REFRESH OP) Place 2 drops into both eyes as needed (For dry eyes.).    Yes Historical Provider, MD  RESTASIS 0.05 % ophthalmic emulsion Place 1 drop into both eyes 2 (two) times daily.  04/16/13  Yes Historical Provider, MD  valsartan (DIOVAN) 80 MG tablet Take 80 mg by mouth daily with breakfast.    Yes Historical Provider, MD    metoCLOPramide (REGLAN) 5 MG tablet Take 1-2 tablets (5-10 mg total) by mouth every 6 (six) hours as needed for nausea or vomiting. Patient not taking: Reported on 11/24/2015 10/01/15   Karie Soda, MD  traMADol (ULTRAM) 50 MG tablet Take 1-2 tablets (50-100 mg total) by mouth every 6 (six) hours as needed for moderate pain or severe pain. Patient not taking: Reported on 11/24/2015 10/01/15   Karie Soda, MD     Vital Signs: BP (!) 185/61 (BP Location: Right Arm)   Pulse 98   Temp 98.2 F (36.8 C) (Oral)   Resp 17   Ht 6\' 2"  (1.88 m)   Wt 190 lb 0.6 oz (86.2 kg)   SpO2 92%   BMI 24.40 kg/m   Physical Exam  Awake and Alert NAD Tach with collar in place Abd soft, very tender around drain sites.  Left-sided drain sites are erythematous secondary to skin excoriation from bile leakage.   No evidence of infections.   J-tube to gravity bag with bilious output = Dr. Michaell Cowing has asked to convert this tube so enteral feeds can be started. Both JPs on the left have minimal output and appear to mostly be serous or serosang = Left with ~10 ml recorded and right with ~5 ml recorded in 24 hours. Right-sided drain with some bile tinged serous fluid and fibrinous material.     Imaging: Ir Replc Duoden/jejuno Tube Percut W/fluoro  Result Date: 12/15/2015 INDICATION: Jejunostomy tube pulled out EXAM: IR  REPLACE DUODEN/JEJUNO TUBE PERCUT WITH FLOURO MEDICATIONS: None ANESTHESIA/SEDATION: None CONTRAST:  15mL ISOVUE-300 IOPAMIDOL (ISOVUE-300) INJECTION 61% - administered into the gastric lumen. FLUOROSCOPY TIME:  Fluoroscopy Time: 1 minutes 30 seconds (11.4 mGy). COMPLICATIONS: None immediate. PROCEDURE: Informed written consent was obtained from the patient after a thorough discussion of the procedural risks, benefits and alternatives. All questions were addressed. Maximal Sterile Barrier Technique was utilized including caps, mask, sterile gowns, sterile gloves, sterile drape, hand hygiene and skin  antiseptic. A timeout was performed prior to the initiation of the procedure. The jejunostomy tube was completely pulled out. A Kumpe catheter was advanced into the jejunostomy tube entry site. Contrast was injected. The catheter was advanced over a Bentson into the loop of jejunum. The Kumpe be was exchanged over a Bentson for a 12 French pigtail catheter. This was advanced into the jejunum, looped, and string fixed, then sewn to the skin. Contrast was injected. It was then attached to a gravity drainage bag. FINDINGS: Imaging confirms replacement of the jejunostomy tube in the left lower quadrant as described. IMPRESSION: Successful left lower quadrant jejunostomy tube replacement. Electronically Signed   By: Jolaine ClickArthur  Hoss M.D.   On: 12/15/2015 08:19   Ct Image Guided Drainage By Percutaneous Catheter  Result Date: 12/11/2015 CLINICAL DATA:  Peritonitis and separate fluid collections adjacent to the greater curvature the stomach and lateral to the spleen in the left upper quadrant. The patient presents for percutaneous drainage of these collections. EXAM: CT GUIDED CATHETER DRAINAGE OF PERITONEAL ABSCESS X 2 ANESTHESIA/SEDATION: 2.0 mg IV Versed 50 mcg IV Fentanyl Total Moderate Sedation Time:  36 minutes The patient's level of consciousness and physiologic status were continuously monitored during the procedure by Radiology nursing. PROCEDURE: The procedure, risks, benefits, and alternatives were explained to the patient's wife. Questions regarding the procedure were encouraged and answered. The patient's wife understands and consents to the procedure. A time out was performed prior to initiating the procedure. The left abdominal wall was prepped with chlorhexidine in a sterile fashion, and a sterile drape was applied covering the operative field. A sterile gown and sterile gloves were used for the procedure. Local anesthesia was provided with 1% Lidocaine. CT was performed through the upper abdomen in a  supine position. After localizing sites for percutaneous drain placement, 2 separate 18 gauge trocar needles were advanced under CT guidance into separate collections. Fluid aspiration was performed and a sample sent for culture analysis. Guidewires were advanced and trocar needles removed. The 2 separate tracts were dilated to 10 [place] and over guidewires, separate 10 French percutaneous drainage catheters advance. Catheter positioning was confirmed by CT. The catheters were connected to suction bulbs. The catheters were secured at the skin with Prolene retention sutures and StatLock devices. COMPLICATIONS: None FINDINGS: Both collections yielded clear fluid. Fluid adjacent to the greater curvature of the stomach was slightly blood tinged. Fluid adjacent to the spleen was yellowish in color. Separate 10 French drains were placed in both collections and are draining well after placement. IMPRESSION: CT-guided percutaneous drainage of separate left upper quadrant peritoneal collections along the greater curvature of the stomach and lateral to the spleen. 10 French drains were placed and attached to suction bulb drainage. Given similar nature of fluid from both collections, samples were combined and sent for a single abscess culture analysis. Electronically Signed   By: Irish LackGlenn  Yamagata M.D.   On: 12/11/2015 13:44   Ct Image Guided Drainage By Percutaneous Catheter  Result Date: 12/11/2015 CLINICAL DATA:  Peritonitis and separate fluid collections adjacent to the greater curvature the stomach and lateral to the spleen in the left upper quadrant. The patient presents for percutaneous drainage of these collections. EXAM: CT GUIDED CATHETER DRAINAGE OF PERITONEAL ABSCESS X 2 ANESTHESIA/SEDATION: 2.0 mg IV Versed 50 mcg IV Fentanyl Total Moderate Sedation Time:  36 minutes The patient's level of consciousness and physiologic status were continuously monitored during the procedure by Radiology nursing. PROCEDURE: The  procedure, risks, benefits, and alternatives were explained to the patient's wife. Questions regarding the procedure were encouraged and answered. The patient's wife understands and consents to the procedure. A time out was performed prior to initiating the procedure. The left abdominal wall was prepped with chlorhexidine in a sterile fashion, and a sterile drape was applied covering the operative field. A sterile gown and sterile gloves were used for the procedure. Local anesthesia was provided with 1% Lidocaine. CT was performed through the upper abdomen in a supine position. After localizing sites for percutaneous drain placement, 2 separate 18 gauge trocar needles were advanced under CT guidance into separate collections. Fluid aspiration was performed and a sample sent for culture analysis. Guidewires were advanced and trocar needles removed. The 2 separate tracts were dilated to 10 [place] and over guidewires, separate 10 French percutaneous drainage catheters advance. Catheter positioning was confirmed by CT. The catheters were connected to suction bulbs. The catheters were secured at the skin with Prolene retention sutures and StatLock devices. COMPLICATIONS: None FINDINGS: Both collections yielded clear fluid. Fluid adjacent to the greater curvature of the stomach was slightly blood tinged. Fluid adjacent to the spleen was yellowish in color. Separate 10 French drains were placed in both collections and are draining well after placement. IMPRESSION: CT-guided percutaneous drainage of separate left upper quadrant peritoneal collections along the greater curvature of the stomach and lateral to the spleen. 10 French drains were placed and attached to suction bulb drainage. Given similar nature of fluid from both collections, samples were combined and sent for a single abscess culture analysis. Electronically Signed   By: Irish Lack M.D.   On: 12/11/2015 13:44    Labs:  CBC:  Recent Labs   12/13/15 0447 12/13/15 1402 12/14/15 0549 12/15/15 0424  WBC 11.3* 9.8 7.7 10.2  HGB 8.3* 8.6* 10.3* 8.8*  HCT 25.0* 25.0* 31.0* 26.4*  PLT 493* 553* 516* 633*    COAGS:  Recent Labs  12/04/15 2020 12/11/15 0405 12/13/15 1402  INR 1.62 1.25 1.26  APTT 39*  --  38*    BMP:  Recent Labs  12/12/15 0500 12/13/15 0522 12/14/15 0549 12/15/15 0424  NA 145 147* 149* 147*  K 3.4* 3.5 3.4* 3.4*  CL 114* 120* 120* 118*  CO2 24 23 25 24   GLUCOSE 126* 156* 148* 143*  BUN 42* 37* 37* 35*  CALCIUM 7.8* 8.0* 8.1* 8.3*  CREATININE 1.45* 1.15 1.07 1.03  GFRNONAA 45* 60* >60 >60  GFRAA 52* >60 >60 >60    LIVER FUNCTION TESTS:  Recent Labs  12/09/15 0409 12/10/15 0418 12/13/15 0522 12/14/15 0549  BILITOT 1.1 0.6 2.2* 2.0*  AST 24 25 24 23   ALT 21 20 20 19   ALKPHOS 120 117 121 118  PROT 4.3* 4.5* 5.3* 5.4*  ALBUMIN 1.4* 1.5* 2.1* 1.9*    Assessment and Plan:  Recurrent gastric outlet obstruction = G & J tube placement and balloon dilation of duodenal stricture on 10/17.   Return to OR on 10/19 following accidental G tube removal by  patient causing peritonitis, septic shock and intubation.   CT ordered 10/26 following new J tube drainage revealing pneumatosis and probable SBO at mid jejunum with 2 fluid collections, 1- lateral to greater curvature, 2- lateral to spleen = Drains placed by Dr. Fredia SorrowYamagata on 10/27 = Stable w/ minimal output  Replacement of J-tube by Dr. Bonnielee HaffHoss 10/29 = 12 fr used due to unable to place 7516fr.   Dr. Michaell CowingGross request conversion of J-tube  to feeding tube  I connected the 12 French J-tube to a HoneywellCook Vinyl connecting tube and a Shawnie DapperLopez Valve so enteral feedings can be started.  Please DO NOT put crushed medications through the J- tube, please ONLY use liquid medication and tube feeds.  Electronically Signed: Gwynneth MacleodWENDY S Sydelle Sherfield PA-C 12/15/2015, 9:38 AM   I spent a total of 25 Minutes at the the patient's bedside AND on the patient's hospital floor or  unit, greater than 50% of which was counseling/coordinating care for f/u drains and care for J-tube.

## 2015-12-15 NOTE — Progress Notes (Signed)
[facility] FOR [facility] LAB REPLACEMENT ONLY  The patient does not apply for the [facility] Electrolyte Replac[facility] Protocol based on the criteria listed below:    Is urine output >/= 0.5 ml/kg/hr for the last 6 hours? No. Patient's UOP is 0 ml/kg/hr   Abnormal electrolyte(s): K3.4  If a panic level lab has been reported, has the CCM MD in charge been notified? Yes.  .   Physician:  Coralyn MarkJ Dios, MD  Melrose NakayamaChisholm, Samier Jaco William 12/15/2015 6:14 AM

## 2015-12-15 NOTE — Progress Notes (Signed)
[name] 10:20 AM  Subjective: Patient without any new complaints although is a little confused and his case discussed with his brother-in-law and he did have a bloody bowel movement this morning but no signs of bleeding from his tube and no obvious new complaints  Objective: Vital signs stable afebrile no acute distress abdomen is soft nontender hemoglobin at two day ago level BUN unchanged  Assessment: Multiple medical problems questionable jejunal tube bleeding from trauma  Plan: Consider changing Pepcid to Protonix to better heal his ulcer and please call us if we could be of any further assistance with this hospital stay  [facility],Bharat Antillon E  Pager [phone] After 5PM or if no answer call [phone]

## 2015-12-15 NOTE — Progress Notes (Signed)
Nutrition Follow-up  DOCUMENTATION CODES:   Severe malnutrition in context of acute illness/injury  INTERVENTION:  - Continue TPN per pharmacy. - Initiation of Vital 1.2 @ 10 mL/hr via J-tube today (288 kcal, 18 grams of protein, and 195 mL free water). - RD will follow-up 11/1.  NUTRITION DIAGNOSIS:   Inadequate oral intake related to inability to eat as evidenced by NPO status. -ongoing   GOAL:   Patient will meet greater than or equal to 90% of their needs -met with TPN alone.  MONITOR:   TF tolerance, Weight trends, Labs, Skin, I & O's, Other (Comment) (TPN regimen)  ASSESSMENT:   75 year old male presenting for a post-operative visit. Note for "Post-Operative": Patient returns one month status post robotic resection.  Distal gastrectomy with Billroth I gastroduodenal reconstruction for chronic ulcer causing obstruction.  09/25/2015  10/31 Pt continues on trach collar. He continues to receive Clinimix E 5/15 @ 83 mL/hr (max rate) with 20% ILE @ 10 mL/hr which is providing 1900 kcal (98.7% minimum kcal need) and 100 grams of protein (95% minimum estimated protein need). Per pharmacy note this AM, plan to continue this regimen. Weight -1.3 kg since yesterday.   He had upper endoscopy yesterday with findings of: LA Grade A erosive esophagitis.probably from NG trauma; a gastric tube was found in the stomach; gastritis; stenosed Billroth I gastroduodenostomy was found, characterized by ulceration and visible sutures; multiple non-bleeding duodenal ulcers with no stigmata of bleeding. endoscope was advanced about 25 cm past the anastomosis but did not reach the feeding tube; multiple non-bleeding jejunal ulcers with no stigmata of bleeding.  Current J-tube is 45 [place]; previous J-tube was 62 [place]. RN confirms that appropriate connectors now in place and able to start TF via J-tube. Order in place from yesterday for Vital 1.2 @ 10 mL/hr; will continue this order.   Reviewed Dr.  Johney Maine' note from this AM concerning J-tube, G-tube, and possible SLP evaluation for beginning to advance diet.   Medications reviewed; 10 mg Dulcolax/day, 40 mg IV Pepcid BID, sliding scale Novolog, PRN IV Zofran, 10 mEq IV KCl x2 runs today, 1 g Carafate QID. Labs reviewed; CBGs: 127-147 mg/dL, Na: 147 mmol/L, K: 3.4 mmol/L, Cl: 118 mmol/L, BUN: 35 mg/dL, Ca: 8.3 mg/dL.    10/30 - Pt now off of vent and on trach collar; estimated nutrition needs updated this AM based on this event.  - Weight stable from yesterday.  - Pt with NGT to suction with 500cc dark drainage present and RNstates that this is from night shift.  - Pt currently receiving Clinimix E 5/15 @ 83 mL/hr with 20% ILE @ 10 mL/hr. - Per IR note, J-tube was replaced yesterday.  - At time of visit, BP: 164/55 and MAP: 87. ADDENDUM:  New order for TF initiation and management received. Spoke with Dr. Johney Maine via phone. Plan for low-dose TF. Dr. Johney Maine requests elemental TF which is unfortunately not available on formulary. Will order Vital 1.2 which is a semi-elemental, hydrolyzed, peptide-based formula. Plan to order trickle rate and monitor for possibility to slowly advance rate in the future. Dr. Johney Maine reports pt with pneumatosis and hematoma surrounding J-tube site.     10/29 - Patient receiving TPN: Clinimix E 5/15 @ 83 ml/hr (maximum rate d/t TPN shortage) and 20% ILE @ 10 ml/hr providing 1900 kcal (93% of needs) and 100g protein (90% of needs).  - Patient's weight has decreased another 10 lb since 10/27. - Staff currently in room working  with patient as pt continues to have leakage from J-tube site. - Pt is also having bloody stools. NGT output today: 350 ml, bilious.  - Per surgery note, pt may need a study and reposition of J-tube today. GI may reevaluate.  Patient is currently intubated on ventilator support via trach MV: 11.2L/min Temp (24hrs), Avg:98.9 F (37.2 C), Min:97.7 F (36.5 C), Max:99.7 F (37.6  C)   Diet Order:  TPN (CLINIMIX-E) Adult Diet NPO time specified Except for: Ice Chips TPN (CLINIMIX-E) Adult  Skin:  Wound (see comment) (Abdominal incision)  Last BM:  10/31  Height:   Ht Readings from Last 1 Encounters:  12/08/15 _0  (1.88 m)    Weight:   Wt Readings from Last 1 Encounters:  12/15/15 190 lb 0.6 oz (86.2 kg)    Ideal Body Weight:  80.9 kg  BMI:  Body mass index is 24.4 kg/m.  Estimated Nutritional Needs:   Kcal:  1925-2190 (22-25 kcal/kg)  Protein:  105-123 grams (1.2-1.4 grams/kg)  Fluid:  per MD/NP/Surgery  EDUCATION NEEDS:   No education needs identified at this time    Jarome Matin, MS, RD, LDN Inpatient Clinical Dietitian Pager # [phone] After hours/weekend pager # [phone]

## 2015-12-15 NOTE — Progress Notes (Signed)
Pt has had 6 beat run of V Tach. Dr Kendrick FriesMcQuaid at bedside and aware. Pt currently receiving IV potassium replacement. Will continue to monitor

## 2015-12-15 NOTE — Progress Notes (Addendum)
Physical Therapy Treatment Patient Details Name: Danny GalRalph T [name] MRN: [id number] DOB: April 28, 1940 Today's Date: 12/15/2015    History of Present Illness 75 y/o male with peptic ulcer disease admitted on 10/10 with recurrent gastric outlet obstruction secondary to gastritis admitted underwent G and J tube placement this admission.  On 10/19 had to go back to the OR after he removed his G tube accidentally leading to peritonitis, intubation, septic shock. Rt index finger ischemia likely from Rt brachial a line. Traccheostomy 12/11/15. Trial  off of vent /on Trach collar 10/30 until trip to endo then back on vent..  PT initial evaluation 12/03/15 then discontinued due to VDRF.     PT Comments    The patient was able to  Mobilize to bed edge and  Stand and pivot with 2 max assist, knees slightly buckling, knees supported during transfer. Recommend lift back to bed if functioning. Continue PT while in acute care.  Follow Up Recommendations  SNF;LTACH;Supervision/Assistance - 24 hour     Equipment Recommendations  None recommended by PT    Recommendations for Other Services       Precautions / Restrictions Precautions Precautions: Fall Precaution Comments: ABDOMINAL BINDER FOR  J tube, G tube, PEG. multiple lines, trached, R UE thrombus with index finger embolic event. very weak legs    Mobility  Bed Mobility Overal bed mobility: Needs Assistance Bed Mobility: Rolling;Sidelying to Sit Rolling: Max assist;+2 for safety/equipment;+2 for physical assistance Sidelying to sit: Max assist;+2 for safety/equipment;+2 for physical assistance;HOB elevated   Sit to supine: Max assist;+2 for safety/equipment;+2 for physical assistance   General bed mobility comments: multimodal cues to flex the legs to facilitate the rolling, cues to reach for the bed rails. Patient slid the legs towards the edge, assist with trunk to upright and to scoot forward.   Transfers Overall transfer level: Needs  assistance Equipment used: Rolling walker (2 wheeled) Transfers: Sit to/from UGI CorporationStand;Stand Pivot Transfers Sit to Stand: Max assist;+2 physical assistance;+2 safety/equipment;From elevated surface Stand pivot transfers: Max assist;+2 physical assistance;+2 safety/equipment       General transfer comment: a 3rd  person for safety  to power up to standing due to leg weakness. Left knee supported to prevent buckling during pivot transfer. the patient  took small shuffle steps to get to recliner. Assistance to control descent. HR 115, sats >95% on 28% TC.   Ambulation/Gait                 Stairs            Wheelchair Mobility    Modified Rankin (Stroke Patients Only)       Balance Overall balance assessment: Needs assistance Sitting-balance support: Bilateral upper extremity supported;Feet supported Sitting balance-Leahy Scale: Fair Sitting balance - Comments: patient was able to self support trunk in sitting  at the bed edge x 4 minutes prior to transfer.                            Cognition Arousal/Alertness: Awake/alert                          Exercises      General Comments        Pertinent Vitals/Pain Pain Assessment: No/denies pain Faces Pain Scale: Hurts even more Pain Location: abdomen Pain Descriptors / Indicators: Discomfort;Grimacing Pain Intervention(s): RN gave pain meds during session    Home Living  Prior Function            PT Goals (current goals can now be found in the care plan section) Progress towards PT goals: Progressing toward goals    Frequency    Min 3X/week      PT Plan Current plan remains appropriate    Co-evaluation             End of Session Equipment Utilized During Treatment: Oxygen (28% TC) Activity Tolerance: Patient tolerated treatment well Patient left: in chair;with call bell/phone within reach;with chair alarm set;with family/visitor present      Time: [id number] PT Time Calculation (min) (ACUTE ONLY): 24 min  Charges:  $Therapeutic Activity: 23-37 mins                    G Codes:      Sharen HeckHill, Kimberlly Norgard Elizabeth Yuleidy Rappleye PT [phone]  12/15/2015, 2:56 PM

## 2015-12-15 NOTE — Progress Notes (Signed)
Per Dr. Michaell CowingGross IR must see patient. J-tube must be switched from gravity drainage to connections possible for enteral nutrition. Toniann FailWendy, PA from IR paged today and has come to bedside. Per PA will continue to follow up.

## 2015-12-15 NOTE — Progress Notes (Signed)
Left most lateral pigtail drain removed without difficulty at the request of Dr. Michaell CowingGross  Left/anterior pigtail drain left in place (Dr. Michaell CowingGross did not want this one removed)  Right upper quadrant surgical/blake drain left in place due to still constant output.   Abijah Roussel S Kristain Filo PA-C 12/15/2015 1:01 PM

## 2015-12-16 DIAGNOSIS — E43 Unspecified severe protein-calorie malnutrition: Secondary | ICD-10-CM

## 2015-12-16 LAB — BASIC METABOLIC PANEL
Anion gap: 5 (ref 5–15)
Anion gap: 3 — ABNORMAL LOW (ref 5–15)
BUN: 35 mg/dL — ABNORMAL HIGH (ref 6–20)
BUN: 36 mg/dL — AB (ref 6–20)
CHLORIDE: 121 mmol/L — AB (ref 101–111)
CHLORIDE: 121 mmol/L — AB (ref 101–111)
CO2: 23 mmol/L (ref 22–32)
CO2: 22 mmol/L (ref 22–32)
CREATININE: 0.94 mg/dL (ref 0.61–1.24)
Calcium: 8.5 mg/dL — ABNORMAL LOW (ref 8.9–10.3)
Calcium: 8.1 mg/dL — ABNORMAL LOW (ref 8.9–10.3)
Creatinine, Ser: 0.87 mg/dL (ref 0.61–1.24)
GFR calc non Af Amer: >60 mL/min (ref >60)
Glucose, Bld: 135 mg/dL — ABNORMAL HIGH (ref 65–99)
Glucose, Bld: 135 mg/dL — ABNORMAL HIGH (ref 65–99)
POTASSIUM: 3.3 mmol/L — AB (ref 3.5–5.1)
POTASSIUM: 3.4 mmol/L — AB (ref 3.5–5.1)
SODIUM: 149 mmol/L — AB (ref 135–145)
SODIUM: 146 mmol/L — AB (ref 135–145)

## 2015-12-16 LAB — GLUCOSE, CAPILLARY
GLUCOSE-CAPILLARY: 126 mg/dL — AB (ref 65–99)
GLUCOSE-CAPILLARY: 128 mg/dL — AB (ref 65–99)
GLUCOSE-CAPILLARY: 134 mg/dL — AB (ref 65–99)
GLUCOSE-CAPILLARY: 113 mg/dL — AB (ref 65–99)
Glucose-Capillary: 124 mg/dL — ABNORMAL HIGH (ref 65–99)
Glucose-Capillary: 124 mg/dL — ABNORMAL HIGH (ref 65–99)

## 2015-12-16 LAB — MAGNESIUM: Magnesium: 2 mg/dL (ref 1.7–2.4)

## 2015-12-16 MED ORDER — PANTOPRAZOLE SODIUM 40 MG PO TBEC: 40.0000 mg | DELAYED_RELEASE_TABLET | Freq: Two times a day (BID) | ORAL | Status: DC

## 2015-12-16 MED ORDER — VITAL AF 1.2 CAL PO LIQD
1000.0000 mL | ORAL | Status: DC
  Administered 2015-12-16: 1000 mL
  Filled 2015-12-16 (×2): qty 1000

## 2015-12-16 MED ORDER — FAT EMULSION 20 % IV EMUL
240.0000 mL | INTRAVENOUS | Status: AC
  Administered 2015-12-16: 240 mL via INTRAVENOUS
  Filled 2015-12-16: qty 240
  Filled 2015-12-16: qty 250

## 2015-12-16 MED ORDER — TRACE MINERALS CR-CU-MN-SE-ZN 10-1000-500-60 MCG/ML IV SOLN
INTRAVENOUS | Status: AC
  Administered 2015-12-16: 17:00:00 via INTRAVENOUS
  Filled 2015-12-16 (×2): qty 1992

## 2015-12-16 MED ORDER — DEXTROSE 5 % IV SOLN
INTRAVENOUS | Status: DC
  Administered 2015-12-16 – 2015-12-17 (×2): via INTRAVENOUS

## 2015-12-16 MED ORDER — POTASSIUM CHLORIDE 10 MEQ/50ML IV SOLN
10.0000 meq | INTRAVENOUS | Status: AC
  Administered 2015-12-16 (×2): 10 meq via INTRAVENOUS
  Filled 2015-12-16 (×3): qty 50

## 2015-12-16 MED ORDER — METOPROLOL TARTRATE 5 MG/5ML IV SOLN
5.0000 mg | Freq: Four times a day (QID) | INTRAVENOUS | Status: DC | PRN
  Administered 2015-12-16: 5 mg via INTRAVENOUS
  Filled 2015-12-16: qty 5

## 2015-12-16 MED ORDER — PANTOPRAZOLE SODIUM 40 MG PO PACK
40.0000 mg | PACK | Freq: Two times a day (BID) | ORAL | Status: DC
Start: 2015-12-16 — End: 2015-12-24
  Administered 2015-12-16 – 2015-12-24 (×15): 40 mg
  Filled 2015-12-16 (×16): qty 20

## 2015-12-16 NOTE — Progress Notes (Signed)
Nutrition Follow-up  DOCUMENTATION CODES:   Severe malnutrition in context of acute illness/injury  INTERVENTION:  - Increase TF rate per MD order: Vital 1.2 @ 30 mL/hr to provide 864 kcal, 54 grams of protein, and 584 mL free water.  - Goal rate for TF: Vital 1.2 @ 70 mL/hr to provide 2016 kcal, 126 grams of protein, and 1362 mL free water. - Continue TPN per pharmacy.  - RD will follow-up 11/2.  NUTRITION DIAGNOSIS:   Inadequate oral intake related to inability to eat as evidenced by NPO status. -ongoing  GOAL:   Patient will meet greater than or equal to 90% of their needs -met with TF + TPN  MONITOR:   TF tolerance, Weight trends, Labs, Skin, I & O's, Other (Comment) (TPN regimen)  ASSESSMENT:   75 year old male presenting for a post-operative visit. Note for "Post-Operative": Patient returns one month status post robotic resection.  Distal gastrectomy with Billroth I gastroduodenal reconstruction for chronic ulcer causing obstruction.  09/25/2015  11/1 Pt on trach collar with PMV in place at time of visit this AM. He denies any abdominal pain or nausea. He was very excited to now be able to communicate with staff and is interested in what is going on concerning his care. Pt continues to receiving Clinimix E 5/15 @ 83 mL/hr with 20% ILE @ 10 mL/hr; spoke with Pharmacist who reports plan to continue this regimen today and will continue to monitor for ability to increase TF rate and beginning weaning TPN at that time. Weight stable from yesterday.   Per Dr. Johney Maine' note this AM, plan to advance Vital 1.2 to 30 mL/hr today. Order currently in place and RN had not yet had a chance to increase rate prior to RD visit. Goal for TF outlined above. SLP note from today states plan for MBS this afternoon.  Medications reviewed; 40 mg IV Pepcid BID, sliding scale Novolog, PRN IV Zofran, 40 mg Protonix BID, 10 mEq IV KCl x2 runs today, 1 g Carafate QID.  Labs reviewed; CBGs: 126 and 128 mg/dL,  Na: 149 mmol/L, K: 3.3 mmol/L, Cl: 121 mmol/L, BUN: 35 mg/dL, Ca: 8.5 mg/dL.   10/31 - Pt continues on trach collar.  - He continues to receive Clinimix E 5/15 @ 83 mL/hr (max rate) with 20% ILE @ 10 mL/hr which is providing 1900 kcal (98.7% minimum kcal need) and 100 grams of protein (95% minimum estimated protein need).  - Weight -1.3 kg since yesterday.  - Current J-tube is 88 [place]; previous J-tube was 60 [place].  - RN confirms that appropriate connectors now in place and able to start TF via J-tube.  - Order in place from yesterday for Vital 1.2 @ 10 mL/hr; will continue this order.  - Reviewed Dr. Johney Maine' note from this AM concerning J-tube, G-tube, and possible SLP evaluation for beginning to advance diet.   He had upper endoscopy yesterday with findings of: LA Grade A erosive esophagitis.probably from [facility]; a gastric tube was found in the stomach; gastritis; stenosed Billroth I gastroduodenostomy was found, characterized by ulceration and visible sutures; multiple non-bleeding duodenal ulcers with no stigmata of bleeding. endoscope was advanced about 25 cm past the anastomosis but did not reach the feeding tube; multiple non-bleeding jejunal ulcers with no stigmata of bleeding.    10/30 - Pt now off of vent and on trach collar; estimated nutrition needs updated this AM based on this event.  - Weight stable from yesterday.  - Pt with  NGT to suction with 500cc dark drainage present and RNstates that this is from night shift.  - Pt currently receiving Clinimix E 5/15 @ 83 mL/hr with 20% ILE @ 10 mL/hr. - Per IR note, J-tube was replaced yesterday.  - At time of visit, BP: 164/55 and MAP: 87. ADDENDUM:  New order for TF initiation and management received. Spoke with Dr. Johney Maine via phone. Plan for low-dose TF. Dr. Johney Maine requests elemental TF which is unfortunately not available on formulary. Will order Vital 1.2 which is a semi-elemental, hydrolyzed, peptide-based formula. Plan to  order trickle rate and monitor for possibility to slowly advance rate in the future. Dr. Johney Maine reports pt with pneumatosis and hematoma surrounding J-tube site.    Diet Order:  Diet NPO time specified Except for: Ice Chips TPN (CLINIMIX-E) Adult TPN (CLINIMIX-E) Adult  Skin:  Wound (see comment) (Abdominal incision)  Last BM:  11/1  Height:   Ht Readings from Last 1 Encounters:  12/08/15 _0  (1.88 m)    Weight:   Wt Readings from Last 1 Encounters:  12/16/15 190 lb 0.6 oz (86.2 kg)    Ideal Body Weight:  80.9 kg  BMI:  Body mass index is 24.4 kg/m.  Estimated Nutritional Needs:   Kcal:  1925-2190 (22-25 kcal/kg)  Protein:  105-123 grams (1.2-1.4 grams/kg)  Fluid:  per MD/NP/Surgery  EDUCATION NEEDS:   No education needs identified at this time    Jarome Matin, MS, RD, LDN Inpatient Clinical Dietitian Pager # [phone] After hours/weekend pager # [phone]

## 2015-12-16 NOTE — Progress Notes (Signed)
[name]  [name] [place], [place] [address] Phone: [phone] FAX: [phone]   Danny [name] [name] [id number] 04-May-1940  CARE TEAM:  PCP: Danny Shelling, Danny  Outpatient Care Team: Patient Care Team: Danny [name] Orn, Danny as PCP - General (Internal Medicine) Danny [name] Boston, Danny as Consulting Physician (General Surgery) Danny Silence, Danny as Consulting Physician (Gastroenterology)  Inpatient Treatment Team: Treatment Team: Attending Provider: Michael [place], Danny; Consulting Physician: Danny Silence, Danny; Technician: Danny [name] [name], NT; Consulting Physician: Danny Pccm, Danny; Rounding Team: Danny Pccm, Danny; Registered Nurse: Danny [name] Dike, RN; Chaplain: Danny [name] [name], [place]; Rounding Team: Danny Anchor, Danny; Registered Nurse: Danny [name] Lose, RN; Speech Language Pathologist: Danny [name] [name], [facility]  SURGERY 12/03/2015  POST-OPERATIVE DIAGNOSIS:    Peritonitis Jejunal disruption Gastric leak  PROCEDURE:    LAPAROSCOPY DIAGNOSTIC OMENTOPEXY of jejunal disruption [place] OUT x 21L with drains placement  SURGEON:  Danny [name] [place], Danny   Post-Op 12/01/2015  POST-OPERATIVE DIAGNOSIS:    RECURRENT GASTRIC OUTLET OBSTRUCTION DUE to significant edema at gastroduodenal anastomosis. GASTRITIS Failure to thrive with malnutrition  PROCEDURE:   LAPAROSCOPIC PLACEMENT OF FEEDING JEJUNOSTOMY AND GASTROSTOMY TUBEs ESOPHAGOGASTRODUODENOSCOPY (EGD) BALLOON DILATION OF DUODENAL STRICTURE Gastric biopsies  Closure of gastrotomy  SURGEON:  Danny [name] Boston, Danny  Prior surgery 09/25/2015  POST-OPERATIVE DIAGNOSIS:  Partial gastric outlet obstruction due to chronic pyloric stricture  PROCEDURE:   XI ROBOTIC distal gastrectomy BILROTH I  ANASTOMOSIS  ANTERIOR AND POSTERIOR VAGOTOMY DOR (Anterior 196 degree) FUNDIPLICATION OMENTOPEXY UPPER ENDOSCOPY  SURGEON:  Danny [name] [place], Danny   Problem List:   Principal  Problem:   Bile peritonitis due to gastric tube dislodgement Active Problems:   Essential hypertension, benign   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 09/25/2015   Hyperlipidemia   Anxiety state   Gastric outlet obstruction   Gastric Ileus    Encephalopathy acute   Endotracheally intubated   On mechanically assisted ventilation (HCC)   Protein-calorie malnutrition, severe   Acute respiratory failure with hypoxemia (HCC)   Tobacco abuse   Ischemic right index finger at tip   Gastritis   Gastric tube present (LUQ)   Jejunostomy tube present (LLQ)   Pneumatosis of intestines    Assessment/Plan:  GUARDED but recovering   -Pneumatosis & jejunal bowel wall thickening.  Lactate <2.   No SMA thrombosis.  No evidence of any embolic disease in the mesentery.  Off pressors.  WBC stable Suspect this is source of GL bleeding (NGT & Gtube bilious, not bloody.  EGD without bleeding in stomach/duodenum.   Jejunostomy tube replaced.  No perforation.  No extravasation of contrast around jejunostomy tube.   BMs less bloody & more c/w old blood.  Hold lovenox if Hgb <8  Remove peritoneal drains: D/C last surgical drain (serous w scant mucus/coagulum - no pus).  D/w RNs again. D/C last perc drain in LUQ  PSBO resolved.  Adv TFs via J tube slowly   103m = higher rate trophic.  If tolerates > 24hr, adv to goal  -IV nutrition TNA.  Wean if tolerates TFs  Less G-tube output and able to pass endoscopy tube across the gastroduodenal Bilroth anastomosis signs the gastric outlet obstruction from inflammation is improving.  Lower Gtube output is hopeful sign that maybe his gastric ileus is resolving as well.  Inflammatory narrowing at gastroduodenal Bilroth I anastomosis.  The fact that there is bilious reflux into the stomach argues against a complete obstruction.  Skin care.  H2B for gastriris.  F/u Bx's for Hpylori neg.  Carafate & PPI via Gtube as tolerated.    Tolerating trach  collar trials, so have speech therapy evaluate to rule out aspiration and see if he can try doing some drinking up to a pureed diet with the G-tube in place.  Eventually his stomach will wake up and improve.  Could do Reglan when necessary but would like to hold off on any promotility agents at this time I don't want to stress things out more.  Sedation/Anxiolysis with confusion - challenge with pt that goes from sleeping to pulling at lines/tubes.  More calm a hopeful sign with Haldol, but guarded w pulling out another tube last weekend.  Mobilize & get more active.  Sundowning precautions  I would not operate on this patient unless he goes under septic shock with high lactate, severe distention and peritonitis.  Extremely hostile abdomen with high risk of enterocutaneous fistula and breakdown of any small bowel resection.  Any bowel obstruction would be related to inflammation and adhesions and an early inflammatory stage.  Would manage conservatively anyway.  Having BMs = not complete SBO.  Continue proximal decompression with orogastric active LIWS suction, gastric tube to gravity (to keep pressure down but not actively suck around Gtube skin leak), jejunostomy tube back to gravity.   Follow off ABx.  CT scan PRN if worsens.  VTE prophylaxis - SCDs.  Hold off on enoxaparin if Hgb<8 s or persistent bleeding.   Seems like the GI bleeding is tapering off.  Low K - replacing.  Check Mag  -brachial artery embolus to right index finger resolved.   D/w Dr. Trula Slade with vascular surgery.   Duplex study argues against any more brachial clot with good blood supply.  Can come off heparin   Nicotene patch - apparently dips (pt denied to to me in past but  wife and daughter do confirm that he actually uses it moderately.  Patient's wife hopeful that a lot of this is nicotine withdrawal.  While I am skeptical, does not hurt to use that.  Certainly that can make gastritis/ulcer problems worse.    Disposition:  He will definitetly require skilled care facility with rehabilitation capacity upon discharge from the hospital.  I suspect it's going to take at least 2 weeks if not several before he'll be ready for that transition.  His daughter expressed understanding and appreciation   I discussed operative findings, updated the patient's status, discussed probable steps to recovery, and gave postoperative recommendations to the patient and family.  ICU RNs.  Dr Lake Bells.  Discussion with critical-care nursing staff.  Recommendations were made.  Questions were answered.  They expressed understanding & appreciation.    [name], M.D., F.A.C.S. Gastrointestinal and Minimally Invasive Surgery [facility] [address], [place], [place] [address] [phone] Main / Paging    12/16/2015   Subjective:  Off vent almost 24hrs Confused at times but not agitated Daughter in room   ICU RN & Dr Lake Bells outside room  Objective:  Vital signs:  Vitals:   12/16/15 0322 12/16/15 0400 12/16/15 0500 12/16/15 0600  BP:  (!) 142/51  (!) 148/42  Pulse:      Resp:  (!) 32 (!) 23 (!) 23  Temp: 97.9 F (36.6 C)     TempSrc: Oral     SpO2: 100% 100% 97% 99%  Weight:   86.2 kg (190 lb 0.6 oz)   Height:  Last BM Date: 12/16/15  Intake/Output   Yesterday:  10/31 0701 - 11/01 0700 In: 2492.8 [I.V.:2332.8; NG/GT:50; IV Piggyback:100] Out: 2250 [Urine:2000; Drains:250] This shift:  No intake/output data recorded.  Bowel function:  Flatus: YES  BM:  YES - bloody - more old blood  Drain: Gtube thick dark bile - no blood.  Leaking around some but skin not more inflamed Jtube site cleaner  Blake drain serous with scant coagulum - not purulent to my eval.  No bile tinge LUQ perc drain serosanguinous   Physical Exam:  General: Pt awake/alert.  Following commands but occ confused.   mild acute distress at best.   Eyes: PERRL, normal EOM.  Sclera clear.   No icterus Neuro: CN II-XII intact w/o focal sensory/motor deficits. Lymph: No head/neck/groin lymphadenopathy Psych:  No delerium/psychosis/paranoia HENT: Normocephalic, Mucus membranes moist.  No thrush. ETT in place. Neck: Supple, No tracheal deviation Chest: CTAB. Chest wall pain w good excursion CV:  Pulses intact.  Regular rhythm MS: Normal AROM mjr joints.  No obvious deformity Abdomen:  G tube LUQ.  Rash around skin fair with drainage, less fullness around the region of the gastrostomy tube but no fluctuance.  J tube in LLQ - skin clean  Softer.  Nondistended.  Tenderness at G tube site only.  No pain elsewhere. GU:  Foley in place.  NEMG Ext:  SCDs BLE.  No mjr edema.  Right index finger unchanged - distal pad & nail bed with cyanosis.  Good cap refill to nail bed.  PICC LUE clean Skin: 1+ anasarca.  No other petechiae / purpura  Results:   Diagnosis Stomach, biopsy - CHRONIC FOCALLY ACTIVE GASTRITIS. - THERE IS NO EVIDENCE OF HELICOBACTER PYLORI, DYSPLASIA OR MALIGNANCY. - SEE COMMENT. Microscopic Comment A Warthin-Starry stain is negative for the presence of Helicobacter pylori organisms. (JBK:gt, 12/04/15) Enid Cutter Danny Pathologist, Electronic Signature (Case signed 12/04/2015) Specimen Danny [name] [name] and Clinical Information Specimen(s) Obtained: Stomach, biopsy Specimen Clinical  Labs: Results for orders placed or performed during the hospital encounter of 11/24/15 (from the past 48 hour(s))  Glucose, capillary     Status: Abnormal   Collection Time: 12/14/15  7:28 AM  Result Value Ref Range   Glucose-Capillary 145 (H) 65 - 99 mg/dL  Glucose, capillary     Status: Abnormal   Collection Time: 12/14/15 11:18 AM  Result Value Ref Range   Glucose-Capillary 132 (H) 65 - 99 mg/dL  Glucose, capillary     Status: Abnormal   Collection Time: 12/14/15  4:29 PM  Result Value Ref Range   Glucose-Capillary 133 (H) 65 - 99 mg/dL  Glucose, capillary     Status: Abnormal    Collection Time: 12/14/15  7:31 PM  Result Value Ref Range   Glucose-Capillary 128 (H) 65 - 99 mg/dL  Glucose, capillary     Status: Abnormal   Collection Time: 12/15/15 12:18 AM  Result Value Ref Range   Glucose-Capillary 147 (H) 65 - 99 mg/dL  Glucose, capillary     Status: Abnormal   Collection Time: 12/15/15  4:05 AM  Result Value Ref Range   Glucose-Capillary 131 (H) 65 - 99 mg/dL  Basic metabolic panel     Status: Abnormal   Collection Time: 12/15/15  4:24 AM  Result Value Ref Range   Sodium 147 (H) 135 - 145 mmol/L   Potassium 3.4 (L) 3.5 - 5.1 mmol/L   Chloride 118 (H) 101 - 111 mmol/L   CO2 24 22 - 32 mmol/L  Glucose, Bld 143 (H) 65 - 99 mg/dL   BUN 35 (H) 6 - 20 mg/dL   Creatinine, Ser 1.03 0.61 - 1.24 mg/dL   Calcium 8.3 (L) 8.9 - 10.3 mg/dL   GFR calc non Af Amer >60 >60 mL/min   GFR calc Af Amer >60 >60 mL/min    Comment: (NOTE) The eGFR has been calculated using the CKD EPI equation. This calculation has not been validated in all clinical situations. eGFR's persistently <60 mL/min signify possible Chronic Kidney Disease.    Anion gap 5 5 - 15  CBC with Differential/Platelet     Status: Abnormal   Collection Time: 12/15/15  4:24 AM  Result Value Ref Range   WBC 10.2 4.0 - 10.5 K/uL   RBC 2.96 (L) 4.22 - 5.81 MIL/uL   Hemoglobin 8.8 (L) 13.0 - 17.0 g/dL   HCT 26.4 (L) 39.0 - 52.0 %   MCV 89.2 78.0 - 100.0 fL   MCH 29.7 26.0 - 34.0 pg   MCHC 33.3 30.0 - 36.0 g/dL   RDW 16.0 (H) 11.5 - 15.5 %   Platelets 633 (H) 150 - 400 K/uL   Neutrophils Relative % 68 %   Lymphocytes Relative 12 %   Monocytes Relative 19 %   Eosinophils Relative 1 %   Basophils Relative 0 %   Neutro Abs 7.0 1.7 - 7.7 K/uL   Lymphs Abs 1.2 0.7 - 4.0 K/uL   Monocytes Absolute 1.9 (H) 0.1 - 1.0 K/uL   Eosinophils Absolute 0.1 0.0 - 0.7 K/uL   Basophils Absolute 0.0 0.0 - 0.1 K/uL   WBC Morphology DOHLE BODIES   Glucose, capillary     Status: Abnormal   Collection Time: 12/15/15  8:16  AM  Result Value Ref Range   Glucose-Capillary 127 (H) 65 - 99 mg/dL   Comment 1 Notify RN    Comment 2 Document in Chart   Glucose, capillary     Status: Abnormal   Collection Time: 12/15/15  4:18 PM  Result Value Ref Range   Glucose-Capillary 148 (H) 65 - 99 mg/dL   Comment 1 Notify RN    Comment 2 Document in Chart   Glucose, capillary     Status: Abnormal   Collection Time: 12/15/15  7:42 PM  Result Value Ref Range   Glucose-Capillary 125 (H) 65 - 99 mg/dL   Comment 1 Notify RN   Basic metabolic panel     Status: Abnormal   Collection Time: 12/15/15 10:15 PM  Result Value Ref Range   Sodium 148 (H) 135 - 145 mmol/L   Potassium 3.4 (L) 3.5 - 5.1 mmol/L   Chloride 119 (H) 101 - 111 mmol/L   CO2 23 22 - 32 mmol/L   Glucose, Bld 137 (H) 65 - 99 mg/dL   BUN 36 (H) 6 - 20 mg/dL   Creatinine, Ser 0.96 0.61 - 1.24 mg/dL   Calcium 8.3 (L) 8.9 - 10.3 mg/dL   GFR calc non Af Amer >60 >60 mL/min   GFR calc Af Amer >60 >60 mL/min    Comment: (NOTE) The eGFR has been calculated using the CKD EPI equation. This calculation has not been validated in all clinical situations. eGFR's persistently <60 mL/min signify possible Chronic Kidney Disease.    Anion gap 6 5 - 15  Glucose, capillary     Status: Abnormal   Collection Time: 12/15/15 11:15 PM  Result Value Ref Range   Glucose-Capillary 129 (H) 65 - 99 mg/dL  Comment 1 Notify RN   Glucose, capillary     Status: Abnormal   Collection Time: 12/16/15  3:40 AM  Result Value Ref Range   Glucose-Capillary 126 (H) 65 - 99 mg/dL   Comment 1 Notify RN   Basic metabolic panel     Status: Abnormal   Collection Time: 12/16/15  4:17 AM  Result Value Ref Range   Sodium 149 (H) 135 - 145 mmol/L   Potassium 3.3 (L) 3.5 - 5.1 mmol/L   Chloride 121 (H) 101 - 111 mmol/L   CO2 23 22 - 32 mmol/L   Glucose, Bld 135 (H) 65 - 99 mg/dL   BUN 35 (H) 6 - 20 mg/dL   Creatinine, Ser 0.87 0.61 - 1.24 mg/dL   Calcium 8.5 (L) 8.9 - 10.3 mg/dL   GFR calc  non Af Amer >60 >60 mL/min   GFR calc Af Amer >60 >60 mL/min    Comment: (NOTE) The eGFR has been calculated using the CKD EPI equation. This calculation has not been validated in all clinical situations. eGFR's persistently <60 mL/min signify possible Chronic Kidney Disease.    Anion gap 5 5 - 15    Imaging / Studies: No results found.  Medications / Allergies: per chart  Antibiotics: Anti-infectives    Start     Dose/Rate Route Frequency Ordered Stop   12/09/15 1200  anidulafungin (ERAXIS) 100 mg in sodium chloride 0.9 % 100 mL IVPB     100 mg over 90 Minutes Intravenous Every 24 hours 12/09/15 0918 12/10/15 1333   12/05/15 1200  anidulafungin (ERAXIS) 100 mg in sodium chloride 0.9 % 100 mL IVPB  Status:  Discontinued     100 mg over 90 Minutes Intravenous Every 24 hours 12/04/15 1045 12/09/15 0835   12/04/15 1200  anidulafungin (ERAXIS) 200 mg in sodium chloride 0.9 % 200 mL IVPB     200 mg over 180 Minutes Intravenous  Once 12/04/15 1045 12/04/15 1725   12/04/15 0600  cefoTEtan (CEFOTAN) 2 g in dextrose 5 % 50 mL IVPB     2 g 100 mL/hr over 30 Minutes Intravenous On call to O.R. 12/03/15 1821 12/03/15 2007   12/04/15 0400  piperacillin-tazobactam (ZOSYN) IVPB 3.375 g  Status:  Discontinued     3.375 g 12.5 mL/hr over 240 Minutes Intravenous Every 8 hours 12/03/15 2229 12/15/15 0751   12/02/15 0000  erythromycin ethylsuccinate (EES) 200 MG/5ML suspension 400 mg  Status:  Discontinued    Comments:  Administer via J tube (16Fr LLQ)   400 mg Oral Every 6 hours 12/01/15 2020 12/03/15 0748   11/27/15 1200  erythromycin 500 mg in sodium chloride 0.9 % 100 mL IVPB     500 mg 100 mL/hr over 60 Minutes Intravenous Every 6 hours 11/27/15 0940 11/29/15 0608   11/26/15 0830  erythromycin 250 mg in sodium chloride 0.9 % 100 mL IVPB  Status:  Discontinued     250 mg 100 mL/hr over 60 Minutes Intravenous Every 6 hours 11/26/15 0751 11/27/15 0940        Note: Portions of this  report may have been transcribed using voice recognition software. Every effort was made to ensure accuracy; however, inadvertent computerized transcription errors may be present.   Any transcriptional errors that result from this process are unintentional.    [name], M.D., F.A.C.S. Gastrointestinal and Minimally Invasive Surgery [facility] [address], [place], [place] [address] [phone] Main / Paging  12/16/2015 

## 2015-12-16 NOTE — Progress Notes (Signed)
Patient ID: [name], male   DOB: 1940-08-08, 75 y.o.   MRN: [id number]    Referring Physician(s): Dr. Karie SodaSteven Gross  Supervising Physician: Jolaine ClickHoss, Arthur  [facility]  Chief Complaint: Intra-abdominal fluid collections  Subjective: Patient on trach and mouthing words.  Just had right surgical blake drain removed..  Allergies: Flomax [tamsulosin hcl]; Lisinopril; Lorazepam; and Uroxatral [alfuzosin hcl er]  Medications: Prior to Admission medications   Medication Sig Start Date End Date Taking? Authorizing Provider  ALPRAZolam Prudy Feeler(XANAX) 0.5 MG tablet Take 0.5 mg by mouth at bedtime as needed for sleep or anxiety. 10/02/15  Yes Historical Provider, MD  aspirin EC 81 MG tablet Take 81 mg by mouth every evening.    Yes Historical Provider, MD  atorvastatin (LIPITOR) 40 MG tablet Take 40 mg by mouth at bedtime.    Yes Historical Provider, MD  calcium-vitamin D (OSCAL WITH D) 500-200 MG-UNIT tablet Take 1 tablet by mouth daily with breakfast.   Yes Historical Provider, MD  Cyanocobalamin (VITAMIN B-12) 1000 MCG SUBL Place 1 tablet under the tongue daily with breakfast.   Yes Historical Provider, MD  Docusate Calcium (STOOL SOFTENER PO) Take 1 capsule by mouth daily as needed (for constipation).    Yes Historical Provider, MD  Multiple Vitamin (MULTIVITAMIN) tablet Take 2 tablets by mouth daily with breakfast.    Yes Historical Provider, MD  nitroGLYCERIN (NITROSTAT) 0.4 MG SL tablet Place 0.4 mg under the tongue every 5 (five) minutes as needed for chest pain.   Yes Historical Provider, MD  pantoprazole (PROTONIX) 40 MG tablet Take 40 mg by mouth 2 (two) times daily. 06/02/15  Yes Historical Provider, MD  Polyvinyl Alcohol-Povidone (REFRESH OP) Place 2 drops into both eyes as needed (For dry eyes.).    Yes Historical Provider, MD  RESTASIS 0.05 % ophthalmic emulsion Place 1 drop into both eyes 2 (two) times daily.  04/16/13  Yes Historical Provider, MD  valsartan (DIOVAN)  80 MG tablet Take 80 mg by mouth daily with breakfast.    Yes Historical Provider, MD  metoCLOPramide (REGLAN) 5 MG tablet Take 1-2 tablets (5-10 mg total) by mouth every 6 (six) hours as needed for nausea or vomiting. Patient not taking: Reported on 11/24/2015 10/01/15   Karie SodaSteven Gross, MD  traMADol (ULTRAM) 50 MG tablet Take 1-2 tablets (50-100 mg total) by mouth every 6 (six) hours as needed for moderate pain or severe pain. Patient not taking: Reported on 11/24/2015 10/01/15   Karie SodaSteven Gross, MD    Vital Signs: BP (!) 141/65   Pulse (!) 103   Temp 97.9 F (36.6 C) (Oral)   Resp (!) 23   Ht 6\' 2"  (1.88 m)   Wt 190 lb 0.6 oz (86.2 kg)   SpO2 99%   BMI 24.40 kg/m   Physical Exam: Abd: erythematous secondary to maceration from bile leakage from around g-tube site.  j-tube in place with TFs running with no issues currently. One left sided perc drain remains in place with no output currently.  30cc total yesterday  Imaging: Ir Replc Duoden/jejuno Tube Percut W/fluoro  Result Date: 12/15/2015 INDICATION: Jejunostomy tube pulled out EXAM: IR REPLACE DUODEN/JEJUNO TUBE PERCUT WITH FLOURO MEDICATIONS: None ANESTHESIA/SEDATION: None CONTRAST:  15mL ISOVUE-300 IOPAMIDOL (ISOVUE-300) INJECTION 61% - administered into the gastric lumen. FLUOROSCOPY TIME:  Fluoroscopy Time: 1 minutes 30 seconds (11.4 mGy). COMPLICATIONS: None immediate. PROCEDURE: Informed written consent was obtained from the patient after a thorough discussion of the procedural risks, benefits and  alternatives. All questions were addressed. Maximal Sterile Barrier Technique was utilized including caps, mask, sterile gowns, sterile gloves, sterile drape, hand hygiene and skin antiseptic. A timeout was performed prior to the initiation of the procedure. The jejunostomy tube was completely pulled out. A Kumpe catheter was advanced into the jejunostomy tube entry site. Contrast was injected. The catheter was advanced over a Bentson into the  loop of jejunum. The Kumpe be was exchanged over a Bentson for a 12 French pigtail catheter. This was advanced into the jejunum, looped, and string fixed, then sewn to the skin. Contrast was injected. It was then attached to a gravity drainage bag. FINDINGS: Imaging confirms replacement of the jejunostomy tube in the left lower quadrant as described. IMPRESSION: Successful left lower quadrant jejunostomy tube replacement. Electronically Signed   By: Jolaine ClickArthur  Hoss M.D.   On: 12/15/2015 08:19    Labs:  CBC:  Recent Labs  12/13/15 0447 12/13/15 1402 12/14/15 0549 12/15/15 0424  WBC 11.3* 9.8 7.7 10.2  HGB 8.3* 8.6* 10.3* 8.8*  HCT 25.0* 25.0* 31.0* 26.4*  PLT 493* 553* 516* 633*    COAGS:  Recent Labs  12/04/15 2020 12/11/15 0405 12/13/15 1402  INR 1.62 1.25 1.26  APTT 39*  --  38*    BMP:  Recent Labs  12/14/15 0549 12/15/15 0424 12/15/15 2215 12/16/15 0417  NA 149* 147* 148* 149*  K 3.4* 3.4* 3.4* 3.3*  CL 120* 118* 119* 121*  CO2 25 24 23 23   GLUCOSE 148* 143* 137* 135*  BUN 37* 35* 36* 35*  CALCIUM 8.1* 8.3* 8.3* 8.5*  CREATININE 1.07 1.03 0.96 0.87  GFRNONAA >60 >60 >60 >60  GFRAA >60 >60 >60 >60    LIVER FUNCTION TESTS:  Recent Labs  12/09/15 0409 12/10/15 0418 12/13/15 0522 12/14/15 0549  BILITOT 1.1 0.6 2.2* 2.0*  AST 24 25 24 23   ALT 21 20 20 19   ALKPHOS 120 117 121 118  PROT 4.3* 4.5* 5.3* 5.4*  ALBUMIN 1.4* 1.5* 2.1* 1.9*    Assessment and Plan: 1. S/p perc drain x2 in LUQ fluid collections and J-tube replacement -one of our drains was removed yesterday.  The remaining drain with minimal serousang type output.   -cont barrier cream to try and help skin that is being damaged secondary to bile leakage. -cont to follow -no crushed medications down J-tube.  Just liquid medications or tube feeds.  Make sure J-tube is being flushed appropriately in order to prevent clogging.  Electronically Signed: Letha CapeSBORNE,Varnell Donate E 12/16/2015, 8:41 AM   I  spent a total of 15 Minutes at the the patient's bedside AND on the patient's hospital floor or unit, greater than 50% of which was counseling/coordinating care for intra-abdominal fluid collections

## 2015-12-16 NOTE — Progress Notes (Signed)
Speech Language Pathology Treatment:    Patient Details Name: [name] MRN: [id number] DOB: Dec 11, 1940 Today's Date: 12/16/2015 Time: 1017-1027 SLP Time Calculation (min) (ACUTE ONLY): 10 min  Assessment / Plan / Recommendation Clinical Impression  Pt beckoned SLP into room and reported dyspnea - Daughter [name] present states she gave pt 2 ice chips and post=swallow he overtly was coughing requiring suctioning and now states he "needs air".  SlP occluded pt's trach as he did have wet breathing quality - pt was able to expectorate viscous secretions with occlusion of trach and reported feeling "better".  Using teach back, educated pt and daughter to indication for secretion expectoration and use of oral suction *anterior oral cavity only.  Continue pmsv as tolerated assuring secretions clear first as able.   As pt not tolerating ice chips well today due to overt coughing (suspect aspiration) and dyspnea, will plan to defer MBS to possibly next date.  Pt and daughter [name] in agreement.  Still recommend to proceed with MBS to allow instrumental evaluation due to possible silent aspiration/dysphagia risk.  RN made aware.  Thanks.    HPI HPI: 75 yo male former smoker with progressive epigastric pain, nausea, vomiting, bloating from gastric outlet obstruction with pyloric channel ulcer.  Had Billroth 1, vagotomy, fundoplication, omentopexy August 2017 with initial improvement.  Had recurrent symptoms September 2017 that become progressively worse with gastric dilation from recurrent stricture.  Had laparoscopic placement of jejunostomy tube, gastrostomy tube, and EGD balloon dilation of duodenal stricture 10/17.  Developed gastric leakage with peritonitis after pulling out PEG tube 10/19 and taken to OR. On 10/27 found 2 new LUQ abscesses/fluid collections which were drained percutaneously by IR. Had perc trach 10/27.  As of 10/30 his mental status has improved, he has remained off of vasopressors  for several days and he has done well with weaning off the ventilator per MD note.       SLP Plan  Continue with current plan of care     Recommendations                   Oral Care Recommendations: Oral care QID;Oral care prior to ice chip/H20 Plan: Continue with current plan of care       GO                Mills KollerKimball, Kapena Hamme Ann Damita Eppard, MS [facility] SLP [phone]

## 2015-12-16 NOTE — Progress Notes (Signed)
PCCM PROGRESS NOTE  Admission date: 11/24/2015 Consult date: 12/03/2015 Referring provider: Dr. Michaell CowingGross  CC: Abdominal pain  BRIEF:  75 y/o male with peptic ulcer disease admitted on 10/10 with recurrent gastric outlet obstruction secondary to gastritis admitted underwent G and J tube placement this admission.  On 10/19 had to go back to the OR after he removed his G tube accidentally leading to peritonitis, intubation, septic shock.  Subjective:  More awake, alert Slowly improving  Had maroon bowel movement x1 overnight For tube removal today   Vital signs: BP (!) 141/65 (BP Location: Right Arm)   Pulse (!) 103   Temp 97.9 F (36.6 C) (Oral)   Resp (!) 23   Ht 6\' 2"  (1.88 m)   Wt 190 lb 0.6 oz (86.2 kg)   SpO2 99%   BMI 24.40 kg/m   Intake/output: I/O last 3 completed shifts: In: 3985.8 [I.V.:3661.8; Other:70; NG/GT:50; IV Piggyback:204] Out: 3370 [Urine:2900; Drains:470]   : FiO2 (%):  [28 %] 28 %  General: chronically ill appearing, but more awake and alert today, interactive HEENT: NCAT, #6 perc trach in place with passy-muir valve in place. Janina Mayorach site is clean. PULM: some upper airway rhonchi today, normal effort. Able to clear some secretions, but still needs suction intermittently.  CV: RRR, few irregular beats ABD: Abdominal binder in place MSK: normal bulk and tone Derm: anasarca, thin skin Neuro: Awake, alert, speech clear  CMP Latest Ref Rng & Units 12/16/2015 12/15/2015 12/15/2015  Glucose 65 - 99 mg/dL 454(U135(H) 981(X137(H) 914(N143(H)  BUN 6 - 20 mg/dL 82(N35(H) 56(O36(H) 13(Y35(H)  Creatinine 0.61 - 1.24 mg/dL 8.650.87 7.840.96 6.961.03  Sodium 135 - 145 mmol/L 149(H) 148(H) 147(H)  Potassium 3.5 - 5.1 mmol/L 3.3(L) 3.4(L) 3.4(L)  Chloride 101 - 111 mmol/L 121(H) 119(H) 118(H)  CO2 22 - 32 mmol/L 23 23 24   Calcium 8.9 - 10.3 mg/dL 2.9(B8.5(L) 8.3(L) 8.3(L)  Total Protein 6.5 - 8.1 g/dL - - -  Total Bilirubin 0.3 - 1.2 mg/dL - - -  Alkaline Phos 38 - 126 U/L - - -  AST 15 - 41 U/L - - -   ALT 17 - 63 U/L - - -    CBC Latest Ref Rng & Units 12/15/2015 12/14/2015 12/13/2015  WBC 4.0 - 10.5 K/uL 10.2 7.7 9.8  Hemoglobin 13.0 - 17.0 g/dL 2.8(U8.8(L) 10.3(L) 8.6(L)  Hematocrit 39.0 - 52.0 % 26.4(L) 31.0(L) 25.0(L)  Platelets 150 - 400 K/uL 633(H) 516(H) 553(H)   ABG    Component Value Date/Time   PHART 7.344 (L) 12/07/2015 2230   PCO2ART 31.4 (L) 12/07/2015 2230   PO2ART 132 (H) 12/07/2015 2230   HCO3 16.7 (L) 12/07/2015 2230   TCO2 26 12/04/2007 0638   ACIDBASEDEF 7.6 (H) 12/07/2015 2230   O2SAT 98.3 12/07/2015 2230   CBG (last 3)   Recent Labs  12/15/15 2315 12/16/15 0340 12/16/15 0830  GLUCAP 129* 126* 128*   Imaging: No results found.PCXR w/ minimal L base atx. Aeration improved. Trach good position.   Studies: 10/11 CT abdomen/pelvis> significant distension of the stomach with layering fluid and gas, NG present, worrisome for recurrent gastric outlet obstruction.  RLL consolidation, cholecycstectomy 10/26 CT abdomen/pelvis> Moderate dilatation of proximal and mid small bowel loops, with transition point in anterior right lower quadrant, suspicious for partial small bowel obstruction. Pneumatosis involving multiple dilated small bowel loops in the left abdomen pelvis, suspicious for bowel ischemia. No evidence of portal venous gas or free intraperitoneal air. Two left upper quadrant  fluid collections adjacent to the stomach and spleen, which may represent postoperative fluid collections or abscesses. Small amount free fluid also noted in pelvis.Small bilateral pleural effusions and bibasilar atelectasis. Diffuse mesenteric and body wall edema.  Cultures: Blood 10/19 >> neg Fluid cx 10/17 >> neg Antibiotics: Zosyn 10/19 >>  Anidulafungin 10/19 >> 10/26   Events: 10/10  admission 10/17  Lap placement of J & G tubes, EGD w/ balloon dilation of duodenal stricture, gastric biopsies, closure of gastrotomy 10/18  sundowning, pulled out G tube 10/19  laparaoscopy  (findings gastric leak, bilious ascites, jejunal disruption), omentoplexy of jejunal disruption, gastric leak 10/24 extubated. Required re-intubation.  10/25: placed on precedex for acute encephalopathy; new drainage from J tube. tubefeeds stopped.   10/26 Heparin off since this am (as recommended by vascular). Still on pressors. Getting albumin and lasix to a/w mobilizing third spaced fluid/anasarca. Developed rectal bleeding. This has initiated after starting bowel prep. Hgb has drifted from 8 to 7.7 --> stopped [place] heparin. CT found evidence of bowel pneumotosis worrisome for ischemia, and two LUQ fluid collections 10/27: trach placed. TO IR for Perc drains to be placed in LUQ 10/30 EGD : mild esophagitis, evidence of billroth procedure, gastric pouch, non-bleeding duodenal ulcers, non-bleeding jejunal ulcers, could not reach j - tube 10/31: PMSV trial, pt tolerated well 11/1: >24 hours on trach collar; PSMV in place   Lines/tube: 10/19 ETT >> 10/24 then replaced 10/24 > 10/27 10/19 CVL left subclavian >>  10/19 R brachial arterial line >> 10/20 10/19 abdominal drain x3 >> 10/19 G tube >> 10/19 J tube >> 10/27 Trach placed (feinstein # 6 cuffed), Abd drains by IR 10/29 J dislodged, replaced by IR  Summary: 75 yo male former smoker with progressive epigastric pain, nausea, vomiting, bloating from gastric outlet obstruction with pyloric channel ulcer.  Had Billroth 1, vagotomy, fundoplication, omentopexy August 2017 with initial improvement.  Had recurrent symptoms September 2017 that become progressively worse with gastric dilation from recurrent stricture.  Had laparoscopic placement of jejunostomy tube, gastrostomy tube, and EGD balloon dilation of duodenal stricture 10/17.  Developed gastric leakage with peritonitis after pulling out PEG tube 10/19 and taken to OR. On 10/27 found 2 new LUQ abscesses/fluid collections which were drained percutaneously by IR. Had perc trach 10/27.  As of 10/30  his mental status has improved, he has remained off of vasopressors for several days and he has done well with weaning off the ventilator.11/1 He has been on trach collar for 24 hours, tolerating well.  PSMV is in place and tolerated well. PMHx of CAD s/p stent, GERD, HTN, HLD  Assessment/plan:  Acute respiratory failure > improving Tracheostomy (placed 10/27) due prolonged critical illness, deconditioning and severe protein calorie malnutrition.  - tracheostomy collar >  24 hours - PMSV in place, tolerating well - Cont routine trach care  - can remove sutures next week, if stays off vent can change to cuffless at day 14 (or before if use tube changer)  Recurrent gastric outlet obstruction Recurrent GI bleed with new oozing from J tube GI bleeding> source inflamed small bowel? Abdominal abscess/fluid collections s/p IR drains Protein-calorie malnutrition - nutrition per GI surgery - TPN per surgery - agree with monitoring off of antibiotics - Follow fluid cx from IR drainage  AKI >> improved  Hypokalemia  Hypernatremia  Hyperchloremia  - monitor renal function, urine outpt - replace free water w/ D5W -->can change to VT when OK w/ surgical services.  - f/u am  chemistry  - renal adjust meds  - replaced electrolytes as needed  Acute metabolic encephalopathy > minimal Post-op pain Severe Physical deconditioning  - PRN fent for pain - haldol prn for severe agitation  - continue scheduled haldol, monitor QTc on tele monitor  Rt index finger ischemia likely from Rt brachial a line. > improved, no clot on upper ext doppler ultrasound - VVS consulted 10/20, appreciate input-->resolved  - no more Arterial lines right arm  Hyperglycemia - SSI  Anasarca - cont nutritional support    SUP - Famotidine IV DVT prophylaxis - SCDs Goals of care - Full code  Simonne MartinetPeter E Koleson Reifsteck ACNP-BC [facility] Pager # [phone] OR # [phone] if no answer  12/16/2015, 9:06  AM

## 2015-12-16 NOTE — Progress Notes (Signed)
PCCM notified of pt SVT for 8 beats, orders for STAT Bmet. Will continue to monitor.

## 2015-12-16 NOTE — Progress Notes (Signed)
SLP Cancellation Note  Patient Details Name: [name] MRN: [id number] DOB: 1941-01-25   Cancelled treatment:       Reason Eval/Treat Not Completed:  (will plan for mbs today after discussion with md re: concern for possible silent aspiration)   Donavan Burnetamara Durante Violett, MS [facility] SLP [phone]

## 2015-12-16 NOTE — Progress Notes (Signed)
[place] NOTE   Pharmacy Consult for TPN  Patient Measurements: Height: 6' 2"  (188 cm) Weight: 190 lb 0.6 oz (86.2 kg) IBW/kg (Calculated) : 82.2 TPN AdjBW (KG): 97.2 Body mass index is 24.4 kg/m.  Insulin Requirements: 7 units Novolog in past 24 hours  Current Nutrition: NPO  IVF: NS at 10 ml/hr  Central access: PICC placed 10/12, pt pulled out 10/19. CVC in place since 10/19 TPN date: 10/12-10/19, resumed 10/25  ASSESSMENT                                                                                                          HPI: 37 yoM admitted 10/9 with new upper abdominal discomfort and bloating with concern for recurrent GOO due to chronic pyloric stricture. 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  Pharmacy consulted to start TPN for gastric ileus and obstruction 10/12. Patient underwent G and J tube placement this admission. On 10/19, patient pulled out PICC line. He also had to go back to OR after he removed his G tube accidentally, leading to peritonitis, intubation, and septic shock. Tube feed rate slowly advanced by CCS, but today patient noted to have dark brown output around J tube site, and tube feeds held. Pharmacy asked to resume TPN.   10/30: EGD - esophagitis, edema/erythematous stomach, stenosed Billroth I gastroduodenostomy,  Gastric pouch w/ ulceration, gastroduodenal anastomosis with ulceration, non-bleeding ulcers found in duodenum and jejunum.   Today, 12/16/15:   Glucose - No hx DM noted. CBGs at goal <150.  4 units novolog SSI/24h  Electrolytes - K 3.3 (2 runs KCl ordered), Na and Cl- high (trending up slowly) Clinimix has 35 meq/L of Na+ so not likely contributing much to elevated Na, all other lytes including Corrected Ca WNL  Renal - AKI -resolved, I/O: +529m/24 (drains 2861mout)  LFTs - AST/ALT, Alk Phos, Tbili WNL  TGs -76 (10/13), 97 (10/16),  214 (10/26), 194 (10/30)  Prealbumin - 17.8 (10/11), 12.8 (10/13), 11 (10/16), 12.5 (10/18), < 5 (10/23), 6.4 (10/26), 9.2 (10/30)  Orders to advance TF from 1044mo 30 ml without further advancement  NUTRITIONAL GOALS                                                                                             RD recs (as of 10/27): KcaZJQB:3419otein:111-138 grams (1.2-1.5 grams/kg)  Clinimix 5/15 at a goal rate of 77m8m would provide 108g/day protein, 2013Kcal/day.  **There is currently a natiPsychologist, prison and probation servicesClinimix solution. No 2L bags of Clinimix E 5/20 currently available. To conserve supply, will consider goal rate of Clinimix 5/15  at 83 ml/hr + 20% fat emulsion at 71m/hr, which will keep total volume at 2L rather than increasing use to 3L.   PLAN                                                                                           At 1800 today:  Continue Clinimix E 5/15 to 83 ml/hr (max rate).   Continue electrolytes in TPN despite mild hypernatremia.  Free fluid, D5W, started  20% fat emulsion at 10 ml/hr.  TPN to contain standard multivitamins and trace elements.  IVF per MD.  Continue sensitive SSI q4h.   TPN lab panels on Mondays & Thursdays.    F/u daily.  Look to wean TPN if tolerates TF   DDoreene Eland PharmD, BCPS.   Pager: [phone]/02/2015 8:57 AM

## 2015-12-16 NOTE — Progress Notes (Signed)
Remaining left upper quadrant drain removed at the request of Dr. Michaell CowingGross.  No issues.  Site was dressed in gauze and tape.  Cleva Camero E 12/16/2015

## 2015-12-16 NOTE — Progress Notes (Signed)
[facility] FOR AM LAB REPLACEMENT ONLY  The patient does apply for the [facility] Electroly[facility] Protocol based on the criteria listed below:   1. Is GFR >/= 40 ml/min? Yes.    Patient's GFR today is >60 2. Is urine output >/= 0.5 ml/kg/hr for the last 6 hours? Yes.   Patient's UOP is 1.9 ml/kg/hr 3. Is BUN < 60 mg/dL? Yes.    Patient's BUN today is 35 4. Abnormal electrolyte(s):K3.3 5. Ordered repletion with: per protocol 6. If a panic level lab has been reported, has the CCM MD in charge been notified? Yes.  .   Physician:  Clarisa SchoolsJ De Dios, MD  [place] NakayamaChisholm, Sadhana Frater William 12/16/2015 6:24 AM

## 2015-12-16 NOTE — Progress Notes (Signed)
[facility] CCM notified of pt 17 beat Vtach, Stat BMET ordered. Will continue to monitor.

## 2015-12-17 ENCOUNTER — Inpatient Hospital Stay (HOSPITAL_COMMUNITY): Payer: Medicare Other

## 2015-12-17 DIAGNOSIS — K2900 Acute gastritis without bleeding: Secondary | ICD-10-CM

## 2015-12-17 LAB — GLUCOSE, CAPILLARY
GLUCOSE-CAPILLARY: 121 mg/dL — AB (ref 65–99)
GLUCOSE-CAPILLARY: 127 mg/dL — AB (ref 65–99)
Glucose-Capillary: 117 mg/dL — ABNORMAL HIGH (ref 65–99)
Glucose-Capillary: 126 mg/dL — ABNORMAL HIGH (ref 65–99)
Glucose-Capillary: 127 mg/dL — ABNORMAL HIGH (ref 65–99)
Glucose-Capillary: 138 mg/dL — ABNORMAL HIGH (ref 65–99)
Glucose-Capillary: 142 mg/dL — ABNORMAL HIGH (ref 65–99)

## 2015-12-17 LAB — COMPREHENSIVE METABOLIC PANEL
ALBUMIN: 1.8 g/dL — AB (ref 3.5–5.0)
ALK PHOS: 144 U/L — AB (ref 38–126)
ALT: 21 U/L (ref 17–63)
AST: 24 U/L (ref 15–41)
Anion gap: 4 — ABNORMAL LOW (ref 5–15)
BILIRUBIN TOTAL: 1.5 mg/dL — AB (ref 0.3–1.2)
BUN: 36 mg/dL — AB (ref 6–20)
CALCIUM: 8.2 mg/dL — AB (ref 8.9–10.3)
CO2: 23 mmol/L (ref 22–32)
CREATININE: 0.96 mg/dL (ref 0.61–1.24)
Chloride: 120 mmol/L — ABNORMAL HIGH (ref 101–111)
GFR calc Af Amer: >60 mL/min (ref >60)
GLUCOSE: 140 mg/dL — AB (ref 65–99)
POTASSIUM: 3.9 mmol/L (ref 3.5–5.1)
Sodium: 147 mmol/L — ABNORMAL HIGH (ref 135–145)
TOTAL PROTEIN: 5.6 g/dL — AB (ref 6.5–8.1)

## 2015-12-17 LAB — PHOSPHORUS: Phosphorus: 2.8 mg/dL (ref 2.5–4.6)

## 2015-12-17 LAB — POTASSIUM: Potassium: 3.7 mmol/L (ref 3.5–5.1)

## 2015-12-17 LAB — MAGNESIUM: MAGNESIUM: 2 mg/dL (ref 1.7–2.4)

## 2015-12-17 MED ORDER — METOCLOPRAMIDE HCL 5 MG/ML IJ SOLN
5.0000 mg | Freq: Three times a day (TID) | INTRAMUSCULAR | Status: DC
  Administered 2015-12-17 – 2015-12-18 (×4): 5 mg via INTRAVENOUS
  Filled 2015-12-17 (×4): qty 2

## 2015-12-17 MED ORDER — TRACE MINERALS CR-CU-MN-SE-ZN 10-1000-500-60 MCG/ML IV SOLN
INTRAVENOUS | Status: AC
  Administered 2015-12-17: 18:00:00 via INTRAVENOUS
  Filled 2015-12-17 (×2): qty 1992

## 2015-12-17 MED ORDER — FUROSEMIDE 10 MG/ML IJ SOLN
40.0000 mg | Freq: Once | INTRAMUSCULAR | Status: AC
  Administered 2015-12-17: 40 mg via INTRAVENOUS
  Filled 2015-12-17: qty 4

## 2015-12-17 MED ORDER — VITAL AF 1.2 CAL PO LIQD
1000.0000 mL | ORAL | Status: DC
  Administered 2015-12-17: 1000 mL
  Filled 2015-12-17 (×2): qty 1000

## 2015-12-17 MED ORDER — HYDROCODONE-ACETAMINOPHEN 7.5-325 MG/15ML PO SOLN: 10.0000 mL | ORAL | Status: DC | PRN

## 2015-12-17 MED ORDER — FAT EMULSION 20 % IV EMUL
240.0000 mL | INTRAVENOUS | Status: AC
  Administered 2015-12-17: 240 mL via INTRAVENOUS
  Filled 2015-12-17 (×2): qty 250

## 2015-12-17 MED ORDER — POTASSIUM CHLORIDE 20 MEQ/15ML (10%) PO SOLN
40.0000 meq | ORAL | Status: AC
  Administered 2015-12-17 (×2): 40 meq
  Filled 2015-12-17 (×2): qty 30

## 2015-12-17 NOTE — Progress Notes (Signed)
Speech Language Pathology Treatment: Dysphagia  Patient Details Name: Ardyth GalRalph T Aydin MRN: [id number] DOB: 10-07-1940 Today's Date: 12/17/2015 Time: [id number] SLP Time Calculation (min) (ACUTE ONLY): 18 min  Assessment / Plan / Recommendation Clinical Impression  Pt fatigued from MBS conducted.  Educated family *[name] (daughter) and spouse to findings of MBS using video films in EPIC and diagram for improved understanding.  Reviewed purpose of ice chips (single) AFTER oral care and with PMSV in place to aid oral hygiene, decrease disuse muscle atrophy and provide QOL.    Instructed family to SILENT aspiration during MBS and need to assure pt tolerance of ice chip by monitoring vitals.   Importance for repeat MBS reviewed with family. Using teach back educated and agreeable to plan.    HPI HPI: 75 yo male former smoker with progressive epigastric pain, nausea, vomiting, bloating from gastric outlet obstruction with pyloric channel ulcer.  Had Billroth 1, vagotomy, fundoplication, omentopexy August 2017 with initial improvement.  Had recurrent symptoms September 2017 that become progressively worse with gastric dilation from recurrent stricture.  Had laparoscopic placement of jejunostomy tube, gastrostomy tube, and EGD balloon dilation of duodenal stricture 10/17.  Developed gastric leakage with peritonitis after pulling out PEG tube 10/19 and taken to OR. On 10/27 found 2 new LUQ abscesses/fluid collections which were drained percutaneously by IR. Had perc trach 10/27.  As of 10/30 his mental status has improved, he has remained off of vasopressors for several days and he has done well with weaning off the ventilator per MD note.       SLP Plan  Continue with current plan of care     Recommendations  Diet recommendations: NPO (ice chips) Liquids provided via:  (single ice chips only) Medication Administration: Via alternative means Supervision: Full supervision/cueing for compensatory  strategies Compensations: Hard cough after swallow;Multiple dry swallows after each bite/sip Postural Changes and/or Swallow Maneuvers: Seated upright 90 degrees                Oral Care Recommendations: Oral care prior to ice chip/H20 Follow up Recommendations: LTACH;Inpatient Rehab Plan: Continue with current plan of care       GO                Donavan Burnetamara Bailee Thall, MS [facility] SLP [phone]

## 2015-12-17 NOTE — Progress Notes (Signed)
[name] [place], [place] [address] Phone: [phone] FAX: [phone]   [name] [id number] 1940-12-06  CARE TEAM:  PCP: Irven Shelling, MD  Outpatient Care Team: Patient Care Team: Lavone Orn, MD as PCP - General (Internal Medicine) Michael Boston, MD as Consulting Physician (General Surgery) Arta Silence, MD as Consulting Physician (Gastroenterology)  Inpatient Treatment Team: Treatment Team: Attending Provider: Michael Boston, MD; Consulting Physician: Arta Silence, MD; Technician: Sueanne Margarita, NT; Consulting Physician: Md Pccm, MD; Rounding Team: Md Pccm, MD; Registered Nurse: Lynnell Dike, RN; Chaplain: Hadley Pen, Chaplain; Registered Nurse: Kennyth Lose, RN; Physical Therapy Assistant: Diego Cory, PTA  SURGERY 12/03/2015  POST-OPERATIVE DIAGNOSIS:    Peritonitis Jejunal disruption Gastric leak  PROCEDURE:    LAPAROSCOPY DIAGNOSTIC OMENTOPEXY of jejunal disruption [place] OUT x 21L with drains placement  SURGEON:  Michael Boston, MD   Post-Op 12/01/2015  POST-OPERATIVE DIAGNOSIS:    RECURRENT GASTRIC OUTLET OBSTRUCTION DUE to significant edema at gastroduodenal anastomosis. GASTRITIS Failure to thrive with malnutrition  PROCEDURE:   LAPAROSCOPIC PLACEMENT OF FEEDING JEJUNOSTOMY AND GASTROSTOMY TUBEs ESOPHAGOGASTRODUODENOSCOPY (EGD) BALLOON DILATION OF DUODENAL STRICTURE Gastric biopsies  Closure of gastrotomy  SURGEON:  Michael Boston, MD  Prior surgery 09/25/2015  POST-OPERATIVE DIAGNOSIS:  Partial gastric outlet obstruction due to chronic pyloric stricture  PROCEDURE:   XI ROBOTIC distal gastrectomy BILROTH I  ANASTOMOSIS  ANTERIOR AND POSTERIOR VAGOTOMY DOR (Anterior 572 degree) FUNDIPLICATION OMENTOPEXY UPPER ENDOSCOPY  SURGEON:  Michael Boston, MD   Problem List:   Principal Problem:   Bile peritonitis due to gastric tube  dislodgement Active Problems:   Essential hypertension, benign   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 09/25/2015   Hyperlipidemia   Anxiety state   Gastric outlet obstruction   Gastric Ileus    Encephalopathy acute   Endotracheally intubated   On mechanically assisted ventilation (HCC)   Protein-calorie malnutrition, severe   Acute respiratory failure with hypoxemia (HCC)   Tobacco abuse   Ischemic right index finger at tip   Gastritis   Gastric tube present (LUQ)   Jejunostomy tube present (LLQ)   Pneumatosis of intestines    Assessment/Plan:  GUARDED but recovering   Sedation/Anxiolysis with confusion - challenge with pt that goes from sleeping to pulling at lines/tubes.  More calm a hopeful sign with Haldol, but guarded w pulling out another tube last weekend.  Mobilize & get more active.  Sundowning precautions.  See if can give enteral meds via J tube if tolerates TFs better  Trach collar >36hr hopeful sign.  Diuresis  Tachycardia/inc BP.  Not c/w pain.  Seems calm.  Try metoprolol if CCM agrees  Speech therapy reevaluate to rule out aspiration and see if he can try doing some drinking up to a pureed diet with the G-tube in place.   Follow & replace electrolytes (esp K) as needed  PSBO resolved.  Adv TFs via J tube slowly   39m since still w leaking issues.  If tolerates > 24hr, adv to goal  IV nutrition TNA.  Wean if tolerates TFs at goal  Able to pass endoscopy tube across the gastroduodenal Bilroth anastomosis signs the gastric outlet obstruction from inflammation is improving.  Inflammatory narrowing at gastroduodenal Bilroth I anastomosis.  The fact that there is bilious reflux into the stomach argues against a complete obstruction.  Skin care.   F/u Bx's for Hpylori neg.  Carafate & PPI via [place]  as tolerated.    Inc G tube output but thinly bilious.  Most likely stimulation from TF.  Doubt major reflux, esp w BMs.  Add metoclopramide  for gastric ileus.   Eventually his stomach will wake up and improve.   Pneumatosis & jejunal bowel wall thickening.  Lactate <2.   No SMA thrombosis.  No evidence of any embolic disease in the mesentery.  Off pressors.  WBC stable Suspect this is source of GL bleeding (NGT & Gtube bilious, not bloody.  EGD without bleeding in stomach/duodenum.   Jejunostomy tube replaced.  No perforation.  No extravasation of contrast around jejunostomy tube.   BMs no longer bloody.  Hold lovenox if Hgb <8   Follow off ABx.  CT scan PRN if worsens.  Cultures negative = No more peritoneal drains  I would not operate on this patient unless he goes under septic shock with high lactate, severe distention and peritonitis.  Extremely hostile abdomen with high risk of enterocutaneous fistula and breakdown of any small bowel resection.  Any bowel obstruction would be related to inflammation and adhesions and an early inflammatory stage.  Would manage conservatively anyway.  Having BMs = not complete SBO.  Continue proximal decompression with orogastric active LIWS suction, gastric tube to gravity (to keep pressure down but not actively suck around Gtube skin leak), jejunostomy tube back to gravity.  VTE prophylaxis - SCDs.  Hold off on enoxaparin if Hgb<8 s or persistent bleeding.   Seems like the GI bleeding is tapering off.  Brachial artery embolus to right index finger resolved.   D/w Dr. Trula Slade with vascular surgery.   Duplex study argues against any more brachial clot with good blood supply.  Can come off heparin   Nicotene patch - apparently dips (pt denied to to me in past but  wife and daughter do confirm that he actually uses it moderately.  Patient's wife hopeful that a lot of this is nicotine withdrawal.  While I am skeptical, does not hurt to use that.  Certainly tobacco can make gastritis/ulcer problems worse.    Disposition: He will definitetly require skilled care facility with rehabilitation capacity upon  discharge from the hospital.  I suspect it's going to take at least 2 weeks if not several before he'll be ready for that transition.  His daughter expressed understanding and appreciation   I discussed operative findings, updated the patient's status, discussed probable steps to recovery, and gave postoperative recommendations to the patient.  ICU RNs.  Discussion with critical-care nursing staff.  Recommendations were made.  Questions were answered.  They expressed understanding & appreciation.    [name], M.D., F.A.C.S. Gastrointestinal and Minimally Invasive Surgery [facility] [address], [place], [place] [address] [phone] Main / Paging    12/17/2015   Subjective:  Off vent >36hr Confused but not agitated Inc G tube output - thin ICU RNs in room  Objective:  Vital signs:  Vitals:   12/17/15 0400 12/17/15 0401 12/17/15 0500 12/17/15 0600  BP: (!) 147/49   (!) 143/62  Pulse:      Resp: (!) 28  (!) 23 (!) 27  Temp:      TempSrc:      SpO2: 100% 100% 100% 100%  Weight:   83.6 kg (184 lb 4.9 oz)   Height:        Last BM Date: 12/16/15  Intake/Output   Yesterday:  11/01 0701 - 11/02 0700 In: 3003.6 [  I.V.:2221.9; NG/GT:280.7; IV Piggyback:216] Out: 2110 [Urine:1350; Drains:760] This shift:  Total I/O In: 1521.4 [I.V.:1182.4; Other:285; IV Piggyback:54] Out: 1525 [Urine:850; Drains:675]  Bowel function:  Flatus: YES  BM:  YES - no longer bloody  Drain: Gtube thin light bile - no blood.  Leaking around skin not more inflamed Jtube site scant bilious drainage  Physical Exam:  General: Pt awake/alert.  Following commands but occ confused.   mild acute distress at best.   Eyes: PERRL, normal EOM.  Sclera clear.  No icterus Neuro: CN II-XII intact w/o focal sensory/motor deficits. Lymph: No head/neck/groin lymphadenopathy Psych:  No delerium/psychosis/paranoia HENT: Normocephalic, Mucus membranes moist.  No  thrush. ETT in place. Neck: Supple, No tracheal deviation Chest: CTAB. Chest wall pain w good excursion CV:  Pulses intact.  Regular rhythm MS: Normal AROM mjr joints.  No obvious deformity Abdomen:  G tube LUQ.  Rash around skin fair with drainage, less fullness around the region of the gastrostomy tube but no fluctuance.  J tube in LLQ - skin irritated  Softer.  Nondistended.  Tenderness at R side & LLQ mild no guarding only.  No pain elsewhere.   GU:  Foley in place.  NEMG Ext:  SCDs BLE.  No mjr edema.  Right index finger unchanged - distal pad & nail bed with cyanosis.  Good cap refill to nail bed.  PICC LUE clean Skin: 1+ anasarca.  No other petechiae / purpura  Results:   Diagnosis Stomach, biopsy - CHRONIC FOCALLY ACTIVE GASTRITIS. - THERE IS NO EVIDENCE OF HELICOBACTER PYLORI, DYSPLASIA OR MALIGNANCY. - SEE COMMENT. Microscopic Comment A Warthin-Starry stain is negative for the presence of Helicobacter pylori organisms. (JBK:gt, 12/04/15) Enid Cutter MD Pathologist, Electronic Signature (Case signed 12/04/2015) Specimen Sunnie Odden and Clinical Information Specimen(s) Obtained: Stomach, biopsy Specimen Clinical  Labs: Results for orders placed or performed during the hospital encounter of 11/24/15 (from the past 48 hour(s))  Glucose, capillary     Status: Abnormal   Collection Time: 12/15/15  8:16 AM  Result Value Ref Range   Glucose-Capillary 127 (H) 65 - 99 mg/dL   Comment 1 Notify RN    Comment 2 Document in Chart   Glucose, capillary     Status: Abnormal   Collection Time: 12/15/15  4:18 PM  Result Value Ref Range   Glucose-Capillary 148 (H) 65 - 99 mg/dL   Comment 1 Notify RN    Comment 2 Document in Chart   Glucose, capillary     Status: Abnormal   Collection Time: 12/15/15  7:42 PM  Result Value Ref Range   Glucose-Capillary 125 (H) 65 - 99 mg/dL   Comment 1 Notify RN   Basic metabolic panel     Status: Abnormal   Collection Time: 12/15/15 10:15 PM  Result  Value Ref Range   Sodium 148 (H) 135 - 145 mmol/L   Potassium 3.4 (L) 3.5 - 5.1 mmol/L   Chloride 119 (H) 101 - 111 mmol/L   CO2 23 22 - 32 mmol/L   Glucose, Bld 137 (H) 65 - 99 mg/dL   BUN 36 (H) 6 - 20 mg/dL   Creatinine, Ser 0.96 0.61 - 1.24 mg/dL   Calcium 8.3 (L) 8.9 - 10.3 mg/dL   GFR calc non Af Amer >60 >60 mL/min   GFR calc Af Amer >60 >60 mL/min    Comment: (NOTE) The eGFR has been calculated using the CKD EPI equation. This calculation has not been validated in all clinical situations. eGFR's persistently <  60 mL/min signify possible Chronic Kidney Disease.    Anion gap 6 5 - 15  Glucose, capillary     Status: Abnormal   Collection Time: 12/15/15 11:15 PM  Result Value Ref Range   Glucose-Capillary 129 (H) 65 - 99 mg/dL   Comment 1 Notify RN   Glucose, capillary     Status: Abnormal   Collection Time: 12/16/15  3:40 AM  Result Value Ref Range   Glucose-Capillary 126 (H) 65 - 99 mg/dL   Comment 1 Notify RN   Basic metabolic panel     Status: Abnormal   Collection Time: 12/16/15  4:17 AM  Result Value Ref Range   Sodium 149 (H) 135 - 145 mmol/L   Potassium 3.3 (L) 3.5 - 5.1 mmol/L   Chloride 121 (H) 101 - 111 mmol/L   CO2 23 22 - 32 mmol/L   Glucose, Bld 135 (H) 65 - 99 mg/dL   BUN 35 (H) 6 - 20 mg/dL   Creatinine, Ser 0.87 0.61 - 1.24 mg/dL   Calcium 8.5 (L) 8.9 - 10.3 mg/dL   GFR calc non Af Amer >60 >60 mL/min   GFR calc Af Amer >60 >60 mL/min    Comment: (NOTE) The eGFR has been calculated using the CKD EPI equation. This calculation has not been validated in all clinical situations. eGFR's persistently <60 mL/min signify possible Chronic Kidney Disease.    Anion gap 5 5 - 15  Magnesium     Status: None   Collection Time: 12/16/15  4:17 AM  Result Value Ref Range   Magnesium 2.0 1.7 - 2.4 mg/dL  Glucose, capillary     Status: Abnormal   Collection Time: 12/16/15  8:30 AM  Result Value Ref Range   Glucose-Capillary 128 (H) 65 - 99 mg/dL   Comment 1  Notify RN    Comment 2 Document in Chart   Glucose, capillary     Status: Abnormal   Collection Time: 12/16/15 12:09 PM  Result Value Ref Range   Glucose-Capillary 124 (H) 65 - 99 mg/dL   Comment 1 Notify RN    Comment 2 Document in Chart   Glucose, capillary     Status: Abnormal   Collection Time: 12/16/15  4:04 PM  Result Value Ref Range   Glucose-Capillary 134 (H) 65 - 99 mg/dL   Comment 1 Notify RN    Comment 2 Document in Chart   Glucose, capillary     Status: Abnormal   Collection Time: 12/16/15  8:39 PM  Result Value Ref Range   Glucose-Capillary 113 (H) 65 - 99 mg/dL   Comment 1 Notify RN    Comment 2 Document in Chart   Basic metabolic panel     Status: Abnormal   Collection Time: 12/16/15 11:00 PM  Result Value Ref Range   Sodium 146 (H) 135 - 145 mmol/L   Potassium 3.4 (L) 3.5 - 5.1 mmol/L   Chloride 121 (H) 101 - 111 mmol/L   CO2 22 22 - 32 mmol/L   Glucose, Bld 135 (H) 65 - 99 mg/dL   BUN 36 (H) 6 - 20 mg/dL   Creatinine, Ser 0.94 0.61 - 1.24 mg/dL   Calcium 8.1 (L) 8.9 - 10.3 mg/dL   GFR calc non Af Amer >60 >60 mL/min   GFR calc Af Amer >60 >60 mL/min    Comment: (NOTE) The eGFR has been calculated using the CKD EPI equation. This calculation has not been validated in all clinical situations. eGFR's  persistently <60 mL/min signify possible Chronic Kidney Disease.    Anion gap 3 (L) 5 - 15  Glucose, capillary     Status: Abnormal   Collection Time: 12/16/15 11:08 PM  Result Value Ref Range   Glucose-Capillary 124 (H) 65 - 99 mg/dL   Comment 1 Notify RN    Comment 2 Document in Chart   Glucose, capillary     Status: Abnormal   Collection Time: 12/17/15 12:35 AM  Result Value Ref Range   Glucose-Capillary 127 (H) 65 - 99 mg/dL  Glucose, capillary     Status: Abnormal   Collection Time: 12/17/15  3:32 AM  Result Value Ref Range   Glucose-Capillary 142 (H) 65 - 99 mg/dL   Comment 1 Notify RN    Comment 2 Document in Chart   Magnesium     Status: None    Collection Time: 12/17/15  4:55 AM  Result Value Ref Range   Magnesium 2.0 1.7 - 2.4 mg/dL  Phosphorus     Status: None   Collection Time: 12/17/15  4:55 AM  Result Value Ref Range   Phosphorus 2.8 2.5 - 4.6 mg/dL  Comprehensive metabolic panel     Status: Abnormal   Collection Time: 12/17/15  4:55 AM  Result Value Ref Range   Sodium 147 (H) 135 - 145 mmol/L   Potassium 3.9 3.5 - 5.1 mmol/L   Chloride 120 (H) 101 - 111 mmol/L   CO2 23 22 - 32 mmol/L   Glucose, Bld 140 (H) 65 - 99 mg/dL   BUN 36 (H) 6 - 20 mg/dL   Creatinine, Ser 0.96 0.61 - 1.24 mg/dL   Calcium 8.2 (L) 8.9 - 10.3 mg/dL   Total Protein 5.6 (L) 6.5 - 8.1 g/dL   Albumin 1.8 (L) 3.5 - 5.0 g/dL   AST 24 15 - 41 U/L   ALT 21 17 - 63 U/L   Alkaline Phosphatase 144 (H) 38 - 126 U/L   Total Bilirubin 1.5 (H) 0.3 - 1.2 mg/dL   GFR calc non Af Amer >60 >60 mL/min   GFR calc Af Amer >60 >60 mL/min    Comment: (NOTE) The eGFR has been calculated using the CKD EPI equation. This calculation has not been validated in all clinical situations. eGFR's persistently <60 mL/min signify possible Chronic Kidney Disease.    Anion gap 4 (L) 5 - 15    Imaging / Studies: No results found.  Medications / Allergies: per chart  Antibiotics: Anti-infectives    Start     Dose/Rate Route Frequency Ordered Stop   12/09/15 1200  anidulafungin (ERAXIS) 100 mg in sodium chloride 0.9 % 100 mL IVPB     100 mg over 90 Minutes Intravenous Every 24 hours 12/09/15 0918 12/10/15 1333   12/05/15 1200  anidulafungin (ERAXIS) 100 mg in sodium chloride 0.9 % 100 mL IVPB  Status:  Discontinued     100 mg over 90 Minutes Intravenous Every 24 hours 12/04/15 1045 12/09/15 0835   12/04/15 1200  anidulafungin (ERAXIS) 200 mg in sodium chloride 0.9 % 200 mL IVPB     200 mg over 180 Minutes Intravenous  Once 12/04/15 1045 12/04/15 1725   12/04/15 0600  cefoTEtan (CEFOTAN) 2 g in dextrose 5 % 50 mL IVPB     2 g 100 mL/hr over 30 Minutes Intravenous  On call to O.R. 12/03/15 1821 12/03/15 2007   12/04/15 0400  piperacillin-tazobactam (ZOSYN) IVPB 3.375 g  Status:  Discontinued  3.375 g 12.5 mL/hr over 240 Minutes Intravenous Every 8 hours 12/03/15 2229 12/15/15 0751   12/02/15 0000  erythromycin ethylsuccinate (EES) 200 MG/5ML suspension 400 mg  Status:  Discontinued    Comments:  Administer via J tube (16Fr LLQ)   400 mg Oral Every 6 hours 12/01/15 2020 12/03/15 0748   11/27/15 1200  erythromycin 500 mg in sodium chloride 0.9 % 100 mL IVPB     500 mg 100 mL/hr over 60 Minutes Intravenous Every 6 hours 11/27/15 0940 11/29/15 0608   11/26/15 0830  erythromycin 250 mg in sodium chloride 0.9 % 100 mL IVPB  Status:  Discontinued     250 mg 100 mL/hr over 60 Minutes Intravenous Every 6 hours 11/26/15 0751 11/27/15 0940        Note: Portions of this report may have been transcribed using voice recognition software. Every effort was made to ensure accuracy; however, inadvertent computerized transcription errors may be present.   Any transcriptional errors that result from this process are unintentional.    [name], M.D., F.A.C.S. Gastrointestinal and Minimally Invasive Surgery [facility] [address] [place], [place] [address] [phone] Main / Paging     12/17/2015

## 2015-12-17 NOTE — Progress Notes (Signed)
Physical Therapy Treatment Patient Details Name: Danny GalRalph T [name] MRN: [id number] DOB: 06-Mar-1940 Today's Date: 12/17/2015    History of Present Illness 75 y/o male with peptic ulcer disease admitted on 10/10 with recurrent gastric outlet obstruction secondary to gastritis admitted underwent G and J tube placement this admission.  On 10/19 had to go back to the OR after he removed his G tube accidentally leading to peritonitis, intubation, septic shock. Rt index finger ischemia likely from Rt brachial a line. Traccheostomy 12/11/15. Trial  off of vent /on Trach collar 10/30 until trip to endo then back on vent..  PT initial evaluation 12/03/15 then discontinued di=ue to VDRF.    PT Comments    Patient was seen in bed upon arrival with family present. Performed supine to sit x modA with VC's to reach over for bed rail and assistance needed to move BLE's, raise trunk, and rotate hips in bed. Performed sit to stand with verbal/tactile cues required for safe hand placement on the platform walker. Performed static standing with platform walker x 20 seconds requiring modA/minA x 2. Vitals pre-treatment: HR 102, BP 132/59, O2 99%, RR 31; During sit to stand HR 116 bpm, BP 156/39, O2 100%, RR 37; After first stand HR 122 bpm, BP 92/39, O2 99%, RR 34; Post-treatment HR 127 bpm, BP 97/57, O2 99%, RR 31. Performed quick SPT x max A due to fatigue and BP of 92/39. No complaints of pain. Patient seemed very alert during today's session and would communicate by head nodding as well as trying to verbally communicate. Patient was also anxious during today's session. Patient is limited due to decreased activity tolerance, decreased endurance, and low BP during activity.   Follow Up Recommendations  SNF;LTACH;Supervision/Assistance - 24 hour     Equipment Recommendations  None recommended by PT    Recommendations for Other Services       Precautions / Restrictions Precautions Precautions: Fall Precaution  Comments:  J tube, G tube, PEG. multiple lines, trached, R UE thrombus with index finger embolic event. very weak legs Restrictions Weight Bearing Restrictions: No    Mobility  Bed Mobility Overal bed mobility: Needs Assistance Bed Mobility: Supine to Sit Rolling: +2 for physical assistance;Mod assist         General bed mobility comments: VC's needed to grap bed rail to assist with raising trunk. ModA required to support LE, assist with raising trunk, and rotating hips in bed.   Transfers Overall transfer level: Needs assistance Equipment used: Bilateral platform walker   Sit to Stand: Mod assist;+2 physical assistance;From elevated surface Stand pivot transfers: Max assist;+2 physical assistance;+2 safety/equipment       General transfer comment: Required elevated surface to perform STS and verbal/tactile cuing for safe hand placement on platform walker before standing. Max A+3 to perform quick SPT due to BP of 92/39.  Ambulation/Gait                 Stairs            Wheelchair Mobility    Modified Rankin (Stroke Patients Only)       Balance                                    Cognition Arousal/Alertness: Awake/alert Behavior During Therapy: WFL for tasks assessed/performed Overall Cognitive Status: Within Functional Limits for tasks assessed  Exercises      General Comments        Pertinent Vitals/Pain Pain Assessment: No/denies pain    Home Living                      Prior Function            PT Goals (current goals can now be found in the care plan section) Progress towards PT goals: Progressing toward goals    Frequency    Min 3X/week      PT Plan Current plan remains appropriate    Co-evaluation             End of Session Equipment Utilized During Treatment: Oxygen Activity Tolerance: Patient tolerated treatment well;Patient limited by fatigue Patient left: in  chair;with call bell/phone within reach;with chair alarm set;with family/visitor present     Time:  - 13:45 - 14: 10    Charges:    1 gt   1 ta                   G CodesMarcelino Scot:      Caitlin Medlin, SPTA [facility] [phone]  Present and agree with above  Felecia ShellingLori Lashundra Shiveley  PTA [facility]  Acute  Rehab Pager      [phone]

## 2015-12-17 NOTE — Progress Notes (Signed)
Nutrition Follow-up  DOCUMENTATION CODES:   Severe malnutrition in context of acute illness/injury  INTERVENTION:  - Continue TPN per pharmacy. - Continue Vital 1.2 @ 40 mL/hr and continue to advance to goal rate of Vital 1.2 @ 70 mL/hr. - RD will follow-up 11/3.  NUTRITION DIAGNOSIS:   Inadequate oral intake related to inability to eat as evidenced by NPO status. -ongoing  GOAL:   Patient will meet greater than or equal to 90% of their needs -met with current nutrition support regimen.   MONITOR:   TF tolerance, Weight trends, Labs, Skin, I & O's, Other (Comment) (TPN regimen)  ASSESSMENT:   75 year old male presenting for a post-operative visit. Note for "Post-Operative": Patient returns one month status post robotic resection.  Distal gastrectomy with Billroth I gastroduodenal reconstruction for chronic ulcer causing obstruction.  09/25/2015  11/2 Pt continues with trach collar. Estimated kcal need updated this AM as weight -2.6 kg since yesterday. Will continue to monitor weight trends and adjust as needed. He is receiving TPN: Clinimix E 5/15 @ 83 mL/hr with 20% ILE @ 10 mL/hr which is providing 1900 kcal and 100 grams of protein (100% estimated kcal and protein need). Per Dr. Johney Maine' note this AM, plan to increase Vital 1.2 to 40 mL/hr today (1152 kcal, 72 grams of protein, and 779 mL free water). Spoke with Pharmacist about TPN and plans for possible weaning and d/c of TPN in the near future. Goal for TF: Vital 1.2 @ 70 mL/hr which will provide 2016 kcal, 126 grams of protein (108% max re-estimated protein need), and 1362 mL free water.   Pt out of room at time of RD visit. Family in the room and spoke with them about current plan for TF advancement. Family very appreciative. Per rounds this AM, pt had MBS this AM and was unable to pass; will monitor for note associated to this and ongoing SLP plan. Also per rounds, G-tube continues to drain yellow bile-like contents.    Medications reviewed; 40 mg IV Lasix x1 dose today, sliding scale Novolog, 5 mg IV Reglan TID, PRN IV Zofran, 40 mg Protonix once/day, 40 mEq KCl per tube every 4 hours, 1 g Carafate QID.  Labs reviewed; CBGs: 127 and 142 mg/dL, Na: 147 mmol/L, Cl: 120 mmol/L, BUN: 36 mg/dL, Ca: 8.2 mg/dL, Alk Phos elevated.     11/1 - Pt on trach collar with PMV in place at time of visit.  - He denies any abdominal pain or nausea.  - Pt continues to receiving Clinimix E 5/15 @ 83 mL/hr with 20% ILE @ 10 mL/hr; spoke with Pharmacist who reports plan to continue this regimen today and will continue to monitor for ability to increase TF rate and beginning weaning TPN.  - Weight stable from yesterday.  - Per Dr. Johney Maine' note this AM, plan to advance Vital 1.2 to 30 mL/hr today.  - Order currently in place and RN had not yet had a chance to increase rate prior to RD visit. - SLP note from today states plan for MBS this afternoon.    10/31 - Pt continues on trach collar.  - He continues to receive Clinimix E 5/15 @ 83 mL/hr (max rate) with 20% ILE @ 10 mL/hr which is providing 1900 kcal (98.7% minimum kcal need) and 100 grams of protein (95% minimum estimated protein need).  - Weight -1.3 kg since yesterday.  - Current J-tube is 85 [place]; previous J-tube was 31 [place].  - RN confirms  that appropriate connectors now in place and able to start TF via J-tube.  - Order in place from yesterday for Vital 1.2 @ 10 mL/hr; will continue this order.  - Reviewed Dr. Johney Maine' note from this AM concerning J-tube, G-tube, and possible SLP evaluation for beginning to advance diet.   He had upper endoscopy yesterday with findings of: LA Grade A erosive esophagitis.probably from [place]; agastric tube was found in the stomach; gastritis; stenosed Billroth I gastroduodenostomy was found, characterized by ulceration and visible sutures; multiple non-bleeding duodenal ulcers with no stigmata of bleeding. endoscope was  advanced about 25 cm past the anastomosis but did not reach the feeding tube; multiple non-bleeding jejunal ulcers with no stigmata of bleeding.    Diet Order:  Diet NPO time specified Except for: Ice Chips TPN (CLINIMIX-E) Adult TPN (CLINIMIX-E) Adult  Skin:  Wound (see comment) (Abdominal incision)  Last BM:  11/2  Height:   Ht Readings from Last 1 Encounters:  12/08/15 6' 2"  (1.88 m)    Weight:   Wt Readings from Last 1 Encounters:  12/17/15 184 lb 4.9 oz (83.6 kg)    Ideal Body Weight:  80.9 kg  BMI:  Body mass index is 23.66 kg/m.  Estimated Nutritional Needs:   Kcal:  1840-2090 (22-25 kcal/kg)  Protein:  100-117 grams (1.2-1.4 grams/kg)  Fluid:  per MD/NP/Surgery  EDUCATION NEEDS:   No education needs identified at this time    Jarome Matin, MS, RD, LDN Inpatient Clinical Dietitian Pager # [phone] After hours/weekend pager # [phone]

## 2015-12-17 NOTE — Progress Notes (Signed)
eLink Physician-Brief Progress Note Patient Name: Danny GalRalph T Oetken DOB: 1940/12/28 MRN: [id number]   Date of Service  12/17/2015  HPI/Events of Note  hypokalemia  eICU Interventions  repleted     Intervention Category Intermediate Interventions: Electrolyte abnormality - evaluation and management  Merwyn KatosDavid B Tatyana Biber 12/17/2015, 12:06 AM

## 2015-12-17 NOTE — Progress Notes (Signed)
Modified Barium Swallow Progress Note  Patient Details  Name: [name] MRN: [id number] Date of Birth: [birth year]  Today's Date: 12/17/2015  Modified Barium Swallow completed.  Full report located under Chart Review in the Imaging Section.  Brief recommendations include the following:  Clinical Impression  Patient presents with moderate oropharyngeal dysphagia characterized by significant weakness resulting in residuals and aspiration.  Pt was only given minimal amount of thin and nectar barium during testing.  Of note, after patient was transferred to flouro chair, he demonstrated copious coughing.  He cleared scant secretions through trach tube at that time.  Transferring to chair and coughing episode significantly fatigued patient.  PMSV was placed for MBS with good tolerance during testing.    Unfortunately patient is grossly weak resulting in oropharyngeal deficits and secretion retention that mixed with barium.  Delayed oral transiting, lingual pumping and premature spillage of barium into pharynx noted due to weakness.  Compromised tongue base retraction and laryngeal elevation allowed laryngeal penetration and SILENT aspiration both during and after the swallow.  Cues to cough/throat clear and dry swallow did not fully clear penetrates/aspirates and patient quickly re-penetrated pharyngeal residuals.  In addition patient appeared with decreased proximal esophageal opening/decreased CP opening resulting in residuals in pyriform sinus.  Unfortunately patient is at high risk of aspiration with intake at this time.  Using teach back/live video educated patient to findings and reinforced effective compensation strategies.     Recommend continue NPO except single ice chips with strict precautions - oral care, pmsv in place and cueing patient to cough/clear throat and re-swallow.    Pt will need repeat MBS in future due to silent nature of dysphagia. Will follow up for dysphagia  treatment/management.   Thanks for allowing me to help care for this patient.    Swallow Evaluation Recommendations       SLP Diet Recommendations: NPO;Ice chips PRN after oral care       Medication Administration: Via alternative means       Compensations: Minimize environmental distractions;Slow rate;Multiple dry swallows after each bite/sip;Hard cough after swallow       Oral Care Recommendations: Oral care QID        Mickie BailKimball, Stephenson Cichy Ann Jhoanna Heyde LutcherKimball, MS [facility] SLP [phone]

## 2015-12-17 NOTE — Progress Notes (Signed)
Speech Language Pathology Treatment:    Patient Details Name: Ardyth GalRalph T Ezekiel MRN: [id number] DOB: Jul 09, 1940 Today's Date: 12/17/2015 Time: [id number] SLP Time Calculation (min) (ACUTE ONLY): 13 min  Assessment / Plan / Recommendation Clinical Impression  Pt today with improved secretion management and great tolerance of pmsv.  All vitals stable with pt ability to expectorate secretions via trach prior to placement of pmsv.    SlP did provide pt with a few ice chips with good tolerance (unlike yesterday).  No indication of airway compromise or significant dysphagia.    Recommend to proceed with MBS in lieu of BSE to allow instrumental evaluation given pt prolonged medical coarse and deconditioning. This can be done a 9 am if MD approves.  SLP paged Dr Michaell CowingGross for approval at [phone].       HPI HPI: 75 yo male former smoker with progressive epigastric pain, nausea, vomiting, bloating from gastric outlet obstruction with pyloric channel ulcer.  Had Billroth 1, vagotomy, fundoplication, omentopexy August 2017 with initial improvement.  Had recurrent symptoms September 2017 that become progressively worse with gastric dilation from recurrent stricture.  Had laparoscopic placement of jejunostomy tube, gastrostomy tube, and EGD balloon dilation of duodenal stricture 10/17.  Developed gastric leakage with peritonitis after pulling out PEG tube 10/19 and taken to OR. On 10/27 found 2 new LUQ abscesses/fluid collections which were drained percutaneously by IR. Had perc trach 10/27.  As of 10/30 his mental status has improved, he has remained off of vasopressors for several days and he has done well with weaning off the ventilator per MD note.       SLP Plan  Continue with current plan of care     Recommendations  Diet recommendations: NPO (until MBS)      Patient may use Passy-Muir Speech Valve: During all waking hours (remove during sleep) PMSV Supervision: Full MD: Please consider changing trach tube  to : Cuffless         Oral Care Recommendations: Oral care BID Follow up Recommendations: LTACH Plan: Continue with current plan of care       GO                Mills KollerKimball, Semya Klinke Ann Maximiano Lott, MS [facility] SLP [phone]

## 2015-12-17 NOTE — Progress Notes (Addendum)
[facility] NOTE   Pharmacy Consult for TPN  Patient Measurements: Height: 6' 2"  (188 cm) Weight: 184 lb 4.9 oz (83.6 kg) IBW/kg (Calculated) : 82.2 TPN AdjBW (KG): 97.2 Body mass index is 23.66 kg/m.  Insulin Requirements: 5 units Novolog in past 24 hours  Current Nutrition: NPO  IVF: D5W 73m/hr - stopped 11/2 by PCCM d/t higher CBGs  Central access: PICC placed 10/12, pt pulled out 10/19. CVC in place since 10/19 TPN date: 10/12-10/19, resumed 10/25  ASSESSMENT                                                                                                          HPI: [id number] admitted 10/9 with new upper abdominal discomfort and bloating with concern for recurrent GOO due to chronic pyloric stricture. 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  Pharmacy consulted to start TPN for gastric ileus and obstruction 10/12. Patient underwent G and J tube placement this admission. On 10/19, patient pulled out PICC line. He also had to go back to OR after he removed his G tube accidentally, leading to peritonitis, intubation, and septic shock. Tube feed rate slowly advanced by CCS, but today patient noted to have dark brown output around J tube site, and tube feeds held. Pharmacy asked to resume TPN.   10/30: EGD - esophagitis, edema/erythematous stomach, stenosed Billroth I gastroduodenostomy,  Gastric pouch w/ ulceration, gastroduodenal anastomosis with ulceration, non-bleeding ulcers found in duodenum and jejunum.  10/31 and 11/1: peritoneal drains removed by IR  Today, 12/17/15:   Glucose - No hx DM noted. CBGs at goal <150.  5 units novolog SSI/24h  Electrolytes - K 3.3 (2 runs KCl ordered), Na and Cl- high (stable) Clinimix has 35 meq/L of Na+ so not likely contributing much to elevated Na, all other lytes including Corrected Ca WNL  PCCM suggests free fluid via tube (if CCS  agrees)  Renal - AKI -resolved, I/O: +8925m24 (drains 76048mut)  LFTs - AST/ALT WNL, Alk Phos and Tbili slightly elevated  TGs -76 (10/13), 97 (10/16), 214 (10/26), 194 (10/30)  Prealbumin - 17.8 (10/11), 12.8 (10/13), 11 (10/16), 12.5 (10/18), < 5 (10/23), 6.4 (10/26), 9.2 (10/30)  Orders to advance TF via J-tube to 41m59m (goal 70ml68mper RD).  Still with leaking issues per CCS note  NUTRITIONAL GOALS                                                                                             RD recs (as of 10/27): Kcal:KXFG:1829ein:111-138 grams (1.2-1.5 grams/kg)  Clinimix 5/15 at a goal rate of 90ml/3mould  provide 108g/day protein, 2013Kcal/day.  **There is currently a Psychologist, prison and probation services of Clinimix solution. No 2L bags of Clinimix E 5/20 currently available. To conserve supply, will consider goal rate of Clinimix 5/15 at 83 ml/hr + 20% fat emulsion at 74m/hr, which will keep total volume at 2L rather than increasing use to 3L.   PLAN                                                                                           At 1800 today:  Continue Clinimix E 5/15 to 83 ml/hr (max rate).   Continue electrolytes in TPN despite mild hypernatremia.    20% fat emulsion at 10 ml/hr.  Due to severe Clinimix shortage, suggest stopping TPN 11/3 if tolerating TF  TPN to contain standard multivitamins and trace elements.  IVF per MD.  Continue sensitive SSI q4h.   TPN lab panels on Mondays & Thursdays.    F/u daily.   DDoreene Eland PharmD, BCPS.   Pager: [phone]/03/2015 7:20 AM

## 2015-12-17 NOTE — Progress Notes (Signed)
PCCM PROGRESS NOTE  Admission date: 11/24/2015 Consult date: 12/03/2015 Referring provider: Dr. Michaell CowingGross  CC: Abdominal pain  BRIEF:  75 y/o male with peptic ulcer disease admitted on 10/10 with recurrent gastric outlet obstruction secondary to gastritis admitted underwent G and J tube placement this admission.  On 10/19 had to go back to the OR after he removed his G tube accidentally leading to peritonitis, intubation, septic shock.  Subjective:  Awake, alert No vent needs overnight Hypokalemia replaced   Vital signs: BP (!) 143/62   Pulse (!) 103   Temp 98.6 F (37 C) (Oral)   Resp (!) 27   Ht 6\' 2"  (1.88 m)   Wt 184 lb 4.9 oz (83.6 kg)   SpO2 100%   BMI 23.66 kg/m   Intake/output: I/O last 3 completed shifts: In: 4299.6 [I.V.:3447.9; Other:355; NG/GT:280.7; IV Piggyback:216] Out: 3290 [Urine:2400; Drains:890]   : FiO2 (%):  [28 %] 28 %  General:chronically ill appearing on vent HENT: NCAT Tracheostomy clean, dry, intact PULM: CTA B, normal effort CV: RRR, no mgr GI: BS+, G and J  in place Derm: no rash, mild edema unchanged Neuro: awake, alert, conversant, follows commands  CMP Latest Ref Rng & Units 12/17/2015 12/16/2015 12/16/2015  Glucose 65 - 99 mg/dL 960(A140(H) 540(J135(H) 811(B135(H)  BUN 6 - 20 mg/dL 14(N36(H) 82(N36(H) 56(O35(H)  Creatinine 0.61 - 1.24 mg/dL 1.300.96 8.650.94 7.840.87  Sodium 135 - 145 mmol/L 147(H) 146(H) 149(H)  Potassium 3.5 - 5.1 mmol/L 3.9 3.4(L) 3.3(L)  Chloride 101 - 111 mmol/L 120(H) 121(H) 121(H)  CO2 22 - 32 mmol/L 23 22 23   Calcium 8.9 - 10.3 mg/dL 8.2(L) 8.1(L) 8.5(L)  Total Protein 6.5 - 8.1 g/dL 6.9(G5.6(L) - -  Total Bilirubin 0.3 - 1.2 mg/dL 2.9(B1.5(H) - -  Alkaline Phos 38 - 126 U/L 144(H) - -  AST 15 - 41 U/L 24 - -  ALT 17 - 63 U/L 21 - -    CBC Latest Ref Rng & Units 12/15/2015 12/14/2015 12/13/2015  WBC 4.0 - 10.5 K/uL 10.2 7.7 9.8  Hemoglobin 13.0 - 17.0 g/dL 2.8(U8.8(L) 10.3(L) 8.6(L)  Hematocrit 39.0 - 52.0 % 26.4(L) 31.0(L) 25.0(L)  Platelets 150 - 400  K/uL 633(H) 516(H) 553(H)   ABG    Component Value Date/Time   PHART 7.344 (L) 12/07/2015 2230   PCO2ART 31.4 (L) 12/07/2015 2230   PO2ART 132 (H) 12/07/2015 2230   HCO3 16.7 (L) 12/07/2015 2230   TCO2 26 12/04/2007 0638   ACIDBASEDEF 7.6 (H) 12/07/2015 2230   O2SAT 98.3 12/07/2015 2230   CBG (last 3)   Recent Labs  12/17/15 0035 12/17/15 0332 12/17/15 0838  GLUCAP 127* 142* 127*   Imaging: No results found.PCXR w/ minimal L base atx. Aeration improved. Trach good position.   Studies: 10/11 CT abdomen/pelvis> significant distension of the stomach with layering fluid and gas, NG present, worrisome for recurrent gastric outlet obstruction.  RLL consolidation, cholecycstectomy 10/26 CT abdomen/pelvis> Moderate dilatation of proximal and mid small bowel loops, with transition point in anterior right lower quadrant, suspicious for partial small bowel obstruction. Pneumatosis involving multiple dilated small bowel loops in the left abdomen pelvis, suspicious for bowel ischemia. No evidence of portal venous gas or free intraperitoneal air. Two left upper quadrant fluid collections adjacent to the stomach and spleen, which may represent postoperative fluid collections or abscesses. Small amount free fluid also noted in pelvis.Small bilateral pleural effusions and bibasilar atelectasis. Diffuse mesenteric and body wall edema.  Cultures: Blood 10/19 >> neg  Fluid cx 10/17 >> neg  Antibiotics: Zosyn 10/19 >>  Anidulafungin 10/19 >> 10/26   Events: 10/10  admission 10/17  Lap placement of J & G tubes, EGD w/ balloon dilation of duodenal stricture, gastric biopsies, closure of gastrotomy 10/18  sundowning, pulled out G tube 10/19  laparaoscopy (findings gastric leak, bilious ascites, jejunal disruption), omentoplexy of jejunal disruption, gastric leak 10/24 extubated. Required re-intubation.  10/25: placed on precedex for acute encephalopathy; new drainage from J tube. tubefeeds  stopped.   10/26 Heparin off since this am (as recommended by vascular). Still on pressors. Getting albumin and lasix to a/w mobilizing third spaced fluid/anasarca. Developed rectal bleeding. This has initiated after starting bowel prep. Hgb has drifted from 8 to 7.7 --> stopped [place] heparin. CT found evidence of bowel pneumotosis worrisome for ischemia, and two LUQ fluid collections 10/27: trach placed. TO IR for Perc drains to be placed in LUQ 10/30 EGD : mild esophagitis, evidence of billroth procedure, gastric pouch, non-bleeding duodenal ulcers, non-bleeding jejunal ulcers, could not reach j - tube 10/31: PMSV trial, pt tolerated well 11/1: >24 hours on trach collar; PSMV in place   Lines/tube: 10/19 ETT >> 10/24 then replaced 10/24 > 10/27 10/19 CVL left subclavian >>  10/19 R brachial arterial line >> 10/20 10/19 abdominal drain x3 >> 10/19 G tube >> 10/19 J tube >> 10/27 Trach placed (feinstein # 6 cuffed), Abd drains by IR 10/29 J dislodged, replaced by IR  Summary: 75 yo male former smoker with progressive epigastric pain, nausea, vomiting, bloating from gastric outlet obstruction with pyloric channel ulcer.  Had Billroth 1, vagotomy, fundoplication, omentopexy August 2017 with initial improvement.  Had recurrent symptoms September 2017 that become progressively worse with gastric dilation from recurrent stricture.  Had laparoscopic placement of jejunostomy tube, gastrostomy tube, and EGD balloon dilation of duodenal stricture 10/17.  Developed gastric leakage with peritonitis after pulling out PEG tube 10/19 and taken to OR. On 10/27 found 2 new LUQ abscesses/fluid collections which were drained percutaneously by IR. Had perc trach 10/27.    Has remained off of ventilatory support for > 48 hours as of 11/2  PMHx of CAD s/p stent, GERD, HTN, HLD  Assessment/plan:  Acute respiratory failure > improving Tracheostomy status.  - tracheostomy collar >  24 hours - PMSV in place,  tolerating well - Cont routine trach care  - can remove sutures 11/3, if stays off vent can change to cuffless tracheostomy at day 14 - would consider decannulation only when he is up walking around, taking by mouth  Recurrent gastric outlet obstruction GI bleeding> source inflamed small bowel? > improved Abdominal abscess/fluid collections s/p IR drains Protein-calorie malnutrition - nutrition per GI surgery - TPN per surgery - monitor off of antibiotics - Follow fluid cx from IR drainage - SLP evaluation today  AKI >> resolved Hypernatremia  Hyperchloremia  - monitor renal function, urine outpt - d/c D5W given hyperglycemia - would add free water to tube feedings if OK by surgery (100 mL q4hr to start with or even 50mL q2h) - f/u am chemistry  - replace electrolytes as needed  Acute metabolic encephalopathy > minimal Post-op pain Severe Physical deconditioning  - PRN fent for pain - haldol prn for severe agitation  - continue scheduled haldol, monitor QTc on tele monitor  Rt index finger ischemia likely from Rt brachial a line. > improved, no clot on upper ext doppler ultrasound - VVS consulted 10/20, appreciate input-->resolved  - no more Arterial  lines right arm  Hyperglycemia - SSI  Anasarca - cont nutritional support    SUP - Famotidine IV DVT prophylaxis - SCDs Goals of care - Full code   Heber [place], MD [place] P[facility]M Pager: [phone] Cell: [phone] After 3pm or if no response, call [phone]   12/17/2015, 9:07 AM

## 2015-12-17 NOTE — Progress Notes (Signed)
Date:  December 17, 2015 Chart reviewed for concurrent status and case management needs. Will continue to follow the patient for status change: trache collar at 28%, off vent x 24 hours, is requiring frequent heavy suctions due to thick secreations, large outpt from j-tube/ tube feeding through the g-tube,has ba swallowing test this am and failed, npo with iv flds. Discharge Planning: following for needs Expected discharge date: [id number] Marcelle SmilingRhonda Davis, BSN, [place]RN3, [place]CCM   [phone]

## 2015-12-18 ENCOUNTER — Encounter (HOSPITAL_COMMUNITY)
Admission: AD | Disposition: A | Discharge status: Skilled Nursing Facility | Payer: Self-pay | Source: Ambulatory Visit | Attending: Surgery

## 2015-12-18 ENCOUNTER — Inpatient Hospital Stay (HOSPITAL_COMMUNITY): Payer: Medicare Other

## 2015-12-18 LAB — BLOOD GAS, ARTERIAL
Acid-Base Excess: 0.3 mmol/L (ref 0.0–2.0)
Bicarbonate: 23.3 mmol/L (ref 20.0–28.0)
FIO2: 28
O2 Saturation: 95.1 %
PATIENT TEMPERATURE: 98.6
PH ART: 7.465 — AB (ref 7.350–7.450)
pCO2 arterial: 32.8 mmHg (ref 32.0–48.0)
pO2, Arterial: 76.2 mmHg — ABNORMAL LOW (ref 83.0–108.0)

## 2015-12-18 LAB — BASIC METABOLIC PANEL
ANION GAP: 3 — AB (ref 5–15)
BUN: 46 mg/dL — ABNORMAL HIGH (ref 6–20)
CHLORIDE: 119 mmol/L — AB (ref 101–111)
CO2: 25 mmol/L (ref 22–32)
Calcium: 8.2 mg/dL — ABNORMAL LOW (ref 8.9–10.3)
Creatinine, Ser: 1.24 mg/dL (ref 0.61–1.24)
GFR calc non Af Amer: 55 mL/min — ABNORMAL LOW (ref >60)
GLUCOSE: 137 mg/dL — AB (ref 65–99)
Potassium: 3.8 mmol/L (ref 3.5–5.1)
Sodium: 147 mmol/L — ABNORMAL HIGH (ref 135–145)

## 2015-12-18 LAB — GLUCOSE, CAPILLARY
GLUCOSE-CAPILLARY: 129 mg/dL — AB (ref 65–99)
GLUCOSE-CAPILLARY: 118 mg/dL — AB (ref 65–99)
GLUCOSE-CAPILLARY: 131 mg/dL — AB (ref 65–99)
Glucose-Capillary: 128 mg/dL — ABNORMAL HIGH (ref 65–99)
Glucose-Capillary: 88 mg/dL (ref 65–99)

## 2015-12-18 LAB — CBC
HCT: 26 % — ABNORMAL LOW (ref 39.0–52.0)
Hemoglobin: 8.6 g/dL — ABNORMAL LOW (ref 13.0–17.0)
MCH: 29 pg (ref 26.0–34.0)
MCHC: 33.1 g/dL (ref 30.0–36.0)
MCV: 87.5 fL (ref 78.0–100.0)
PLATELETS: 573 10*3/uL — AB (ref 150–400)
RBC: 2.97 MIL/uL — AB (ref 4.22–5.81)
RDW: 15.4 % (ref 11.5–15.5)
WBC: 10.6 10*3/uL — AB (ref 4.0–10.5)

## 2015-12-18 SURGERY — EGD (ESOPHAGOGASTRODUODENOSCOPY)
Anesthesia: Moderate Sedation

## 2015-12-18 MED ORDER — TRACE MINERALS CR-CU-MN-SE-ZN 10-1000-500-60 MCG/ML IV SOLN
INTRAVENOUS | Status: AC
  Administered 2015-12-18: 17:00:00 via INTRAVENOUS
  Filled 2015-12-18 (×2): qty 1992

## 2015-12-18 MED ORDER — NYSTATIN 100000 UNIT/GM EX POWD
Freq: Three times a day (TID) | CUTANEOUS | Status: DC
  Administered 2015-12-18 – 2015-12-24 (×20): via TOPICAL
  Administered 2015-12-25: 1 via TOPICAL
  Administered 2015-12-25 – 2015-12-27 (×6): via TOPICAL
  Administered 2015-12-27: 1 via TOPICAL
  Administered 2015-12-28 – 2015-12-29 (×6): via TOPICAL
  Administered 2015-12-29: 1 via TOPICAL
  Filled 2015-12-18: qty 15

## 2015-12-18 MED ORDER — METOPROLOL TARTRATE 5 MG/5ML IV SOLN
2.5000 mg | Freq: Four times a day (QID) | INTRAVENOUS | Status: DC
  Administered 2015-12-18 – 2015-12-23 (×16): 2.5 mg via INTRAVENOUS
  Filled 2015-12-18 (×16): qty 5

## 2015-12-18 MED ORDER — FAT EMULSION 20 % IV EMUL
250.0000 mL | INTRAVENOUS | Status: AC
  Administered 2015-12-18: 250 mL via INTRAVENOUS
  Filled 2015-12-18 (×2): qty 250

## 2015-12-18 MED ORDER — FAMOTIDINE IN NACL 20-0.9 MG/50ML-% IV SOLN
20.0000 mg | Freq: Two times a day (BID) | INTRAVENOUS | Status: DC
  Administered 2015-12-18 – 2015-12-21 (×7): 20 mg via INTRAVENOUS
  Filled 2015-12-18 (×7): qty 50

## 2015-12-18 MED ORDER — IOPAMIDOL (ISOVUE-300) INJECTION 61%
30.0000 mL | Freq: Once | INTRAVENOUS | Status: AC | PRN
  Administered 2015-12-18: 30 mL

## 2015-12-18 NOTE — Progress Notes (Addendum)
[facility]  [address] [place], [place] [address] Phone: [phone] FAX: [phone]   [name] [id number] 1940-07-24  CARE TEAM:  PCP: Irven Shelling, MD  Outpatient Care Team: Patient Care Team: Lavone Orn, MD as PCP - General (Internal Medicine) Michael Boston, MD as Consulting Physician (General Surgery) Arta Silence, MD as Consulting Physician (Gastroenterology)  Inpatient Treatment Team: Treatment Team: Attending Provider: Michael Boston, MD; Consulting Physician: Arta Silence, MD; Technician: Sueanne Margarita, NT; Consulting Physician: Md Pccm, MD; Rounding Team: Md Pccm, MD; Registered Nurse: Lynnell Dike, RN; Chaplain: Hadley Pen, Chaplain; Registered Nurse: Kennyth Lose, RN; Physical Therapist: Lizabeth Leyden  SURGERY 12/03/2015  POST-OPERATIVE DIAGNOSIS:    Peritonitis Jejunal disruption Gastric leak  PROCEDURE:    LAPAROSCOPY DIAGNOSTIC OMENTOPEXY of jejunal disruption [place] OUT x 21L with drains placement  SURGEON:  Michael Boston, MD   Post-Op 12/01/2015  POST-OPERATIVE DIAGNOSIS:    RECURRENT GASTRIC OUTLET OBSTRUCTION DUE to significant edema at gastroduodenal anastomosis. GASTRITIS Failure to thrive with malnutrition  PROCEDURE:   LAPAROSCOPIC PLACEMENT OF FEEDING JEJUNOSTOMY AND GASTROSTOMY TUBEs ESOPHAGOGASTRODUODENOSCOPY (EGD) BALLOON DILATION OF DUODENAL STRICTURE Gastric biopsies  Closure of gastrotomy  SURGEON:  Michael Boston, MD  Prior surgery 09/25/2015  POST-OPERATIVE DIAGNOSIS:  Partial gastric outlet obstruction due to chronic pyloric stricture  PROCEDURE:   XI ROBOTIC distal gastrectomy BILROTH I  ANASTOMOSIS  ANTERIOR AND POSTERIOR VAGOTOMY DOR (Anterior 664 degree) FUNDIPLICATION OMENTOPEXY UPPER ENDOSCOPY  SURGEON:  Michael Boston, MD   Problem List:   Principal Problem:   Bile peritonitis due to gastric tube dislodgement Active  Problems:   Essential hypertension, benign   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 09/25/2015   Hyperlipidemia   Anxiety state   Gastric outlet obstruction   Gastric Ileus    Encephalopathy acute   Endotracheally intubated   On mechanically assisted ventilation (HCC)   Protein-calorie malnutrition, severe   Acute respiratory failure with hypoxemia (HCC)   Tobacco abuse   Ischemic right index finger at tip   Gastritis   Gastric tube present (LUQ)   Jejunostomy tube present (LLQ)   Pneumatosis of intestines    Assessment/Plan:  GUARDED but recovering   He pulled out J tube at ~0500.  I replaced with 16Fr red robinson.  Balloon filled to 49m.  Stiched 0 silk x 2.  Immediate succus back - placed to gravity.  New abd binder placed to cover tubes.  Tube study Xray pending  Return G tube to suction over weekend 2/2 inc output  J tube to gravity over weekend 2/2 inc output  Retry trophic TF in a few days   Follow off ABx.  CT scan PRN if worsens.  Cultures negative = No more peritoneal drains.  Hopefully oiutside window of developing new intraperitoneal abscess  Sedation/Anxiolysis with confusion - challenge with pt that goes from sleeping to pulling at lines/tubes.  More calm a hopeful sign with Haldol, but guarded w pulling out another tube last weekend & last night.  Mobilize & get more active.  Sundowning precautions.    Trach collar >36hr hopeful sign.  Diuresis  Tachycardia/inc BP.  Not c/w pain.  Seems calm.  Try metoprolol if CCM agrees  Speech therapy reevaluate to rule out aspiration and see if he can try doing some drinking up to a pureed diet with the G-tube in place.   Follow & replace electrolytes (esp K) as needed  IV nutrition TNA.  Wean when tolerates TFs at goal  Able to pass endoscopy tube across the gastroduodenal Bilroth anastomosis signs the gastric outlet obstruction from inflammation is improving.  Inflammatory narrowing at  gastroduodenal Bilroth I anastomosis.  The fact that there is bilious reflux into the stomach argues against a complete obstruction.  Skin care.   F/u Bx's for Hpylori neg.  Carafate & PPI via Gtube as tolerated.    Pneumatosis & jejunal bowel wall thickening.  Lactate <2.   No SMA thrombosis.  No evidence of any embolic disease in the mesentery.  Off pressors.  WBC stable Suspect this is source of GL bleeding (NGT & Gtube bilious, not bloody.  EGD without bleeding in stomach/duodenum.   Jejunostomy tube replaced.  No perforation.  No extravasation of contrast around jejunostomy tube.   BMs no longer bloody.  Hold lovenox if Hgb <8.  If becomes complete SBO, may need SBR - try to hold off  I would not operate on this patient unless he goes under septic shock with high lactate, severe distention and peritonitis.  Extremely hostile abdomen with high risk of enterocutaneous fistula and breakdown of any small bowel resection.  Any bowel obstruction would be related to inflammation and adhesions and an early inflammatory stage.  Would manage conservatively anyway.  Having BMs = not complete SBO.  Continue proximal decompression with orogastric active LIWS suction, gastric tube to gravity (to keep pressure down but not actively suck around Gtube skin leak), jejunostomy tube back to gravity.  VTE prophylaxis - SCDs.  Hold off on enoxaparin if Hgb<8 s or persistent bleeding.   Seems like the GI bleeding has resolved.  Brachial artery embolus to right index finger resolved.   D/w Dr. Trula Slade with vascular surgery.   Duplex study argues against any more brachial clot with good blood supply.  Finger tip improved  Nicotene patch   Disposition: He will definitetly require skilled care facility with rehabilitation capacity upon discharge from the hospital.  I suspect it's going to take at least a few weeks before he'll be ready for that transition.    I discussed operative findings, updated the patient's status,  discussed probable steps to recovery, and gave postoperative recommendations to the patient and family.  ICU RNs.  Discussion with critical-care nursing staff.  Recommendations were made.  Questions were answered.  They expressed understanding & appreciation.    [name], M.D., F.A.C.S. Gastrointestinal and Minimally Invasive Surgery [facility] [address], [place], [place] [address] [phone] Main / Paging    12/18/2015   Subjective:  Pulled out J tube.  Coralee Pesa was off - old one dirty & was awaiting replacement Failed MBS - NPO Confused but not agitated Get up a lot yesterday - tired out  Daughter in room  ICU RNs intermittently in room   Objective:  Vital signs:  Vitals:   12/18/15 0356 12/18/15 0400 12/18/15 0500 12/18/15 0600  BP: (!) 128/52 (!) 138/45  (!) 150/56  Pulse: (!) 110     Resp: (!) 22 (!) 25 (!) 31 (!) 32  Temp:  (!) 100.9 F (38.3 C)    TempSrc:  Oral    SpO2: 97% 97% 97% 97%  Weight:   85.2 kg (187 lb 13.3 oz)   Height:        Last BM Date: 12/17/15  Intake/Output   Yesterday:  11/02 0701 - 11/03 0700 In: 2652.7 [I.V.:1488; NG/GT:1164.7] Out: 4200 [Urine:2525; Drains:1675] This shift:  Total I/O In: 400 [NG/GT:400] Out: 2100 [Urine:1000; Drains:1100]  Bowel function:  Flatus: YES  BM:  No -last 2 days ago  Gtube: thin light bile - no blood.  Leaking around skin not more inflamed Jtube site: scant bilious drainage stopped w upsized 16Fr  Physical Exam:  General: Pt sleepy but awakens.  Following commands but occ confused.   mild acute distress at best.   Eyes: PERRL, normal EOM.  Sclera clear.  No icterus Neuro: CN II-XII intact w/o focal sensory/motor deficits. Lymph: No head/neck/groin lymphadenopathy Psych:  No delerium/psychosis/paranoia HENT: Normocephalic, Mucus membranes moist.  No thrush. ETT in place. Neck: Supple, No tracheal deviation Chest: CTAB. Chest wall pain w good  excursion CV:  Pulses intact.  Regular rhythm MS: Normal AROM mjr joints.  No obvious deformity Abdomen:  G tube LUQ.  Rash around skin fair with drainage, less fullness around the region of the gastrostomy tube but no fluctuance.  Stitched intact x 2.  J tube in LLQ replaced - skin irritated  Edema left flank.   Softer.  Nondistended.  Tenderness at LLQ mild no guarding only.  No pain elsewhere.   GU:  Foley in place.  NEMG Ext:  SCDs BLE.  No mjr edema.  Right index finger unchanged - distal pad & nail bed with cyanosis.  Good cap refill to nail bed.  PICC LUE clean Skin: 1+ anasarca.  No other petechiae / purpura  Results:   Diagnosis Stomach, biopsy - CHRONIC FOCALLY ACTIVE GASTRITIS. - THERE IS NO EVIDENCE OF HELICOBACTER PYLORI, DYSPLASIA OR MALIGNANCY. - SEE COMMENT. Microscopic Comment A Warthin-Starry stain is negative for the presence of Helicobacter pylori organisms. (JBK:gt, 12/04/15) Enid Cutter MD Pathologist, Electronic Signature (Case signed 12/04/2015) Specimen [name] and Clinical Information Specimen(s) Obtained: Stomach, biopsy Specimen Clinical  Labs: Results for orders placed or performed during the hospital encounter of 11/24/15 (from the past 48 hour(s))  Glucose, capillary     Status: Abnormal   Collection Time: 12/16/15  8:30 AM  Result Value Ref Range   Glucose-Capillary 128 (H) 65 - 99 mg/dL   Comment 1 Notify RN    Comment 2 Document in Chart   Glucose, capillary     Status: Abnormal   Collection Time: 12/16/15 12:09 PM  Result Value Ref Range   Glucose-Capillary 124 (H) 65 - 99 mg/dL   Comment 1 Notify RN    Comment 2 Document in Chart   Glucose, capillary     Status: Abnormal   Collection Time: 12/16/15  4:04 PM  Result Value Ref Range   Glucose-Capillary 134 (H) 65 - 99 mg/dL   Comment 1 Notify RN    Comment 2 Document in Chart   Glucose, capillary     Status: Abnormal   Collection Time: 12/16/15  8:39 PM  Result Value Ref Range    Glucose-Capillary 113 (H) 65 - 99 mg/dL   Comment 1 Notify RN    Comment 2 Document in Chart   Basic metabolic panel     Status: Abnormal   Collection Time: 12/16/15 11:00 PM  Result Value Ref Range   Sodium 146 (H) 135 - 145 mmol/L   Potassium 3.4 (L) 3.5 - 5.1 mmol/L   Chloride 121 (H) 101 - 111 mmol/L   CO2 22 22 - 32 mmol/L   Glucose, Bld 135 (H) 65 - 99 mg/dL   BUN 36 (H) 6 - 20 mg/dL   Creatinine, Ser 0.94 0.61 - 1.24 mg/dL   Calcium  8.1 (L) 8.9 - 10.3 mg/dL   GFR calc non Af Amer >60 >60 mL/min   GFR calc Af Amer >60 >60 mL/min    Comment: (NOTE) The eGFR has been calculated using the CKD EPI equation. This calculation has not been validated in all clinical situations. eGFR's persistently <60 mL/min signify possible Chronic Kidney Disease.    Anion gap 3 (L) 5 - 15  Glucose, capillary     Status: Abnormal   Collection Time: 12/16/15 11:08 PM  Result Value Ref Range   Glucose-Capillary 124 (H) 65 - 99 mg/dL   Comment 1 Notify RN    Comment 2 Document in Chart   Glucose, capillary     Status: Abnormal   Collection Time: 12/17/15 12:35 AM  Result Value Ref Range   Glucose-Capillary 127 (H) 65 - 99 mg/dL  Glucose, capillary     Status: Abnormal   Collection Time: 12/17/15  3:32 AM  Result Value Ref Range   Glucose-Capillary 142 (H) 65 - 99 mg/dL   Comment 1 Notify RN    Comment 2 Document in Chart   Magnesium     Status: None   Collection Time: 12/17/15  4:55 AM  Result Value Ref Range   Magnesium 2.0 1.7 - 2.4 mg/dL  Phosphorus     Status: None   Collection Time: 12/17/15  4:55 AM  Result Value Ref Range   Phosphorus 2.8 2.5 - 4.6 mg/dL  Comprehensive metabolic panel     Status: Abnormal   Collection Time: 12/17/15  4:55 AM  Result Value Ref Range   Sodium 147 (H) 135 - 145 mmol/L   Potassium 3.9 3.5 - 5.1 mmol/L   Chloride 120 (H) 101 - 111 mmol/L   CO2 23 22 - 32 mmol/L   Glucose, Bld 140 (H) 65 - 99 mg/dL   BUN 36 (H) 6 - 20 mg/dL   Creatinine, Ser 0.96  0.61 - 1.24 mg/dL   Calcium 8.2 (L) 8.9 - 10.3 mg/dL   Total Protein 5.6 (L) 6.5 - 8.1 g/dL   Albumin 1.8 (L) 3.5 - 5.0 g/dL   AST 24 15 - 41 U/L   ALT 21 17 - 63 U/L   Alkaline Phosphatase 144 (H) 38 - 126 U/L   Total Bilirubin 1.5 (H) 0.3 - 1.2 mg/dL   GFR calc non Af Amer >60 >60 mL/min   GFR calc Af Amer >60 >60 mL/min    Comment: (NOTE) The eGFR has been calculated using the CKD EPI equation. This calculation has not been validated in all clinical situations. eGFR's persistently <60 mL/min signify possible Chronic Kidney Disease.    Anion gap 4 (L) 5 - 15  Glucose, capillary     Status: Abnormal   Collection Time: 12/17/15  8:38 AM  Result Value Ref Range   Glucose-Capillary 127 (H) 65 - 99 mg/dL  Glucose, capillary     Status: Abnormal   Collection Time: 12/17/15 12:51 PM  Result Value Ref Range   Glucose-Capillary 126 (H) 65 - 99 mg/dL  Potassium     Status: None   Collection Time: 12/17/15  3:30 PM  Result Value Ref Range   Potassium 3.7 3.5 - 5.1 mmol/L  Glucose, capillary     Status: Abnormal   Collection Time: 12/17/15  3:49 PM  Result Value Ref Range   Glucose-Capillary 121 (H) 65 - 99 mg/dL  Glucose, capillary     Status: Abnormal   Collection Time: 12/17/15  7:36 PM  Result Value Ref Range   Glucose-Capillary 117 (H) 65 - 99 mg/dL  Glucose, capillary     Status: Abnormal   Collection Time: 12/17/15 11:58 PM  Result Value Ref Range   Glucose-Capillary 138 (H) 65 - 99 mg/dL  Glucose, capillary     Status: Abnormal   Collection Time: 12/18/15  4:07 AM  Result Value Ref Range   Glucose-Capillary 128 (H) 65 - 99 mg/dL  Basic metabolic panel     Status: Abnormal   Collection Time: 12/18/15  5:14 AM  Result Value Ref Range   Sodium 147 (H) 135 - 145 mmol/L   Potassium 3.8 3.5 - 5.1 mmol/L   Chloride 119 (H) 101 - 111 mmol/L   CO2 25 22 - 32 mmol/L   Glucose, Bld 137 (H) 65 - 99 mg/dL   BUN 46 (H) 6 - 20 mg/dL   Creatinine, Ser 1.24 0.61 - 1.24 mg/dL    Calcium 8.2 (L) 8.9 - 10.3 mg/dL   GFR calc non Af Amer 55 (L) >60 mL/min   GFR calc Af Amer >60 >60 mL/min    Comment: (NOTE) The eGFR has been calculated using the CKD EPI equation. This calculation has not been validated in all clinical situations. eGFR's persistently <60 mL/min signify possible Chronic Kidney Disease.    Anion gap 3 (L) 5 - 15    Imaging / Studies: Dg Swallowing Func-speech Pathology  Result Date: 12/17/2015 Objective Swallowing Evaluation: Type of Study: MBS-Modified Barium Swallow Study Patient Details Name: ANVAY TENNIS MRN: [id number] Date of Birth: [birth year] Today's Date: 12/17/2015 Time: SLP Start Time (ACUTE ONLY): 0925-SLP Stop Time (ACUTE ONLY): 0945 SLP Time Calculation (min) (ACUTE ONLY): 20 min Past Medical History: Past Medical History: Diagnosis Date . Bilateral dry eyes  . Coronary artery disease 1997  bare mental stent right coronart artery . Degenerative disc disease   LOWER BACK . Dysphonia  . ED (erectile dysfunction)  . Gastroesophageal reflux disease  . Hyperlipidemia  . Hypertension  . Peptic ulcer  . Pneumonia   HISTORY OF IN CHILDHOOD . Posterior vitreous detachment 10/30/2012 . Urinary hesitancy  . Wears glasses  Past Surgical History: Past Surgical History: Procedure Laterality Date . CARDIAC CATHETERIZATION   . CHOLECYSTECTOMY   . ESOPHAGOGASTRODUODENOSCOPY N/A 12/01/2015  Procedure: ESOPHAGOGASTRODUODENOSCOPY (EGD) BALLOON DILATION OF DUODENAL STRICTURE;  Surgeon: Michael Boston, MD;  Location: [facility];  Service: General;  Laterality: N/A; . ESOPHAGOGASTRODUODENOSCOPY N/A 12/14/2015  Procedure: ESOPHAGOGASTRODUODENOSCOPY (EGD);  Surgeon: Clarene Essex, MD;  Location: [facility];  Service: Endoscopy;  Laterality: N/A;  Patient has tracheostomy . ESOPHAGOGASTRODUODENOSCOPY (EGD) WITH PROPOFOL N/A 07/01/2015  Procedure: ESOPHAGOGASTRODUODENOSCOPY (EGD) WITH PROPOFOL;  Surgeon: Arta Silence, MD;  Location: [facility];  Service: Endoscopy;  Laterality: N/A;  . ESOPHAGOGASTRODUODENOSCOPY (EGD) WITH PROPOFOL N/A 11/23/2015  Procedure: ESOPHAGOGASTRODUODENOSCOPY (EGD) WITH PROPOFOL;  Surgeon: Arta Silence, MD;  Location: [facility];  Service: Endoscopy;  Laterality: N/A;  may need to intubate . EYE SURGERY    growth on right eye removed  . IR GENERIC HISTORICAL  12/13/2015  IR REPLC DUODEN/JEJUNO TUBE PERCUT W/FLUORO 12/13/2015 Marybelle Killings, MD [facility]-INTERV RAD . LAPAROSCOPIC GASTROSTOMY N/A 12/01/2015  Procedure: LAPAROSCOPIC PLACEMENT OF FEEDING JEJUNOSTOMY AND GASTROSTOMY TUBE;  Surgeon: Michael Boston, MD;  Location: [facility];  Service: General;  Laterality: N/A; . LAPAROSCOPY N/A 12/03/2015  Procedure: LAPAROSCOPY DIAGNOSTIC, OMENTOPEXY, JEJUNOSTOMY, [place] OUT;  Surgeon: Michael Boston, MD;  Location: [facility];  Service: General;  Laterality: N/A; . STENT TO HEART    2  [place] . TONSILLECTOMY   . XI ROBOTIC VAGOTOMY AND ANTRECTOMY N/A 09/25/2015  Procedure: XI ROBOTIC ANTERIOR AND POSTERIOR VAGOTOMY, BILROTH I  ANASTOMOSIS DOR FUNDIPLICATION, OMENTOPEXY  UPPER ENDOSCOPY;  Surgeon: Michael Boston, MD;  Location: [facility];  Service: General;  Laterality: N/A; HPI: 75 yo male former smoker with progressive epigastric pain, nausea, vomiting, bloating from gastric outlet obstruction with pyloric channel ulcer.  Had Billroth 1, vagotomy, fundoplication, omentopexy August 2017 with initial improvement.  Had recurrent symptoms September 2017 that become progressively worse with gastric dilation from recurrent stricture.  Had laparoscopic placement of jejunostomy tube, gastrostomy tube, and EGD balloon dilation of duodenal stricture 10/17.  Developed gastric leakage with peritonitis after pulling out PEG tube 10/19 and taken to OR. On 10/27 found 2 new LUQ abscesses/fluid collections which were drained percutaneously by IR. Had perc trach 10/27.  As of 10/30 his mental status has improved, he has remained off of vasopressors for several days and he has done well with weaning off  the ventilator per MD note.  Subjective: pt awake in bed Assessment / Plan / Recommendation CHL IP CLINICAL IMPRESSIONS 12/17/2015 Therapy Diagnosis Moderate oral phase dysphagia;Moderate pharyngeal phase dysphagia;Moderate cervical esophageal phase dysphagia Clinical Impression Patient presents with moderate oropharyngeal dysphagia characterized by significant weakness resulting in residuals and aspiration.  Pt was only given minimal amount of thin and nectar barium during testing.  Of note, after patient was transferred to flouro chair, he demonstrated copious coughing.  He cleared scant secretions through trach tube at that time.  Transferring to chair and coughing episode significantly fatigued patient.  PMSV was placed for MBS with good tolerance during testing.  Unfortunately patient is grossly weak resulting in oropharyngeal deficits and secretion retention that mixed with barium.  Delayed oral transiting, lingual pumping and premature spillage of barium into pharynx noted due to weakness.  Compromised tongue base retraction and laryngeal elevation allowed laryngeal penetration and SILENT aspiration both during and after the swallow.  Cues to cough/throat clear and dry swallow did not fully clear penetrates and patient quickly re-penetrated pharyngeal residuals.  In addition patient appeared with decreased proximal esophageal opening/decreased CP opening resulting in residuals in pyriform sinus.  Unfortunately patient is at high risk of aspiration with intake at this time.  Using teach back/live video educated patient to findings and reinforced effective compensation strategies.   Recommend continue NPO except single ice chips with strict precautions - oral care, pmsv in place and cueing patient to cough/clear throat and re-swallow.  Pt will need repeat MBS in future due to silent nature of dysphagia. Will follow up for dysphagia treatment/management.   Thanks for allowing me to help care for this patient.   Impact on safety and function Severe aspiration risk;Risk for inadequate nutrition/hydration   CHL IP TREATMENT RECOMMENDATION 12/17/2015 Treatment Recommendations F/U MBS in --- days (Comment)   Prognosis 12/17/2015 Prognosis for Safe Diet Advancement Fair Barriers to Reach Goals Severity of deficits;Cognitive deficits Barriers/Prognosis Comment -- CHL IP DIET RECOMMENDATION 12/17/2015 SLP Diet Recommendations NPO;Ice chips PRN after oral care Liquid Administration via -- Medication Administration Via alternative means Compensations Minimize environmental distractions;Slow rate;Multiple dry swallows after each bite/sip;Hard cough after swallow Postural Changes --   CHL IP OTHER RECOMMENDATIONS 12/17/2015 Recommended Consults -- Oral Care Recommendations Oral care QID Other Recommendations --   CHL IP FOLLOW UP RECOMMENDATIONS 12/17/2015 Follow up Recommendations Skilled Nursing facility;Inpatient Rehab   CHL IP FREQUENCY AND DURATION 12/17/2015 Speech Therapy Frequency (ACUTE ONLY)  min 2x/week Treatment Duration 2 weeks      CHL IP ORAL PHASE 12/17/2015 Oral Phase Impaired Oral - Pudding Teaspoon -- Oral - Pudding Cup -- Oral - Honey Teaspoon -- Oral - Honey Cup -- Oral - Nectar Teaspoon -- Oral - Nectar Cup Weak lingual manipulation;Reduced posterior propulsion;Delayed oral transit;Decreased bolus cohesion;Lingual pumping Oral - Nectar Straw -- Oral - Thin Teaspoon Weak lingual manipulation;Delayed oral transit;Decreased bolus cohesion;Reduced posterior propulsion;Lingual pumping Oral - Thin Cup Weak lingual manipulation;Reduced posterior propulsion;Delayed oral transit;Decreased bolus cohesion;Lingual pumping Oral - Thin Straw Weak lingual manipulation;Decreased bolus cohesion;Delayed oral transit;Reduced posterior propulsion;Lingual pumping Oral - Puree -- Oral - Mech Soft -- Oral - Regular -- Oral - Multi-Consistency -- Oral - Pill -- Oral Phase - Comment --  CHL IP PHARYNGEAL PHASE 12/17/2015 Pharyngeal Phase Impaired  Pharyngeal- Pudding Teaspoon -- Pharyngeal -- Pharyngeal- Pudding Cup -- Pharyngeal -- Pharyngeal- Honey Teaspoon -- Pharyngeal -- Pharyngeal- Honey Cup -- Pharyngeal -- Pharyngeal- Nectar Teaspoon Reduced laryngeal elevation;Pharyngeal residue - pyriform;Pharyngeal residue - valleculae;Reduced tongue base retraction;Reduced airway/laryngeal closure;Reduced epiglottic inversion;Penetration/Aspiration during swallow Pharyngeal Material enters airway, passes BELOW cords without attempt by patient to eject out (silent aspiration) Pharyngeal- Nectar Cup Reduced laryngeal elevation;Reduced tongue base retraction;Reduced anterior laryngeal mobility;Reduced epiglottic inversion;Reduced airway/laryngeal closure;Penetration/Aspiration during swallow Pharyngeal Material enters airway, passes BELOW cords without attempt by patient to eject out (silent aspiration) Pharyngeal- Nectar Straw -- Pharyngeal -- Pharyngeal- Thin Teaspoon Reduced laryngeal elevation;Reduced airway/laryngeal closure;Reduced tongue base retraction;Penetration/Aspiration during swallow;Reduced epiglottic inversion;Trace aspiration;Pharyngeal residue - valleculae;Pharyngeal residue - pyriform;Pharyngeal residue - cp segment Pharyngeal Material enters airway, passes BELOW cords without attempt by patient to eject out (silent aspiration) Pharyngeal- Thin Cup Moderate aspiration;Penetration/Aspiration during swallow;Penetration/Apiration after swallow;Reduced pharyngeal peristalsis;Reduced epiglottic inversion;Reduced laryngeal elevation;Reduced airway/laryngeal closure;Reduced tongue base retraction Pharyngeal Material enters airway, passes BELOW cords without attempt by patient to eject out (silent aspiration) Pharyngeal- Thin Straw Reduced pharyngeal peristalsis;Reduced epiglottic inversion;Reduced anterior laryngeal mobility;Reduced laryngeal elevation;Reduced airway/laryngeal closure;Pharyngeal residue - valleculae;Pharyngeal residue - pyriform;Moderate  aspiration;Penetration/Aspiration during swallow Pharyngeal Material enters airway, passes BELOW cords without attempt by patient to eject out (silent aspiration) Pharyngeal- Puree -- Pharyngeal -- Pharyngeal- Mechanical Soft -- Pharyngeal -- Pharyngeal- Regular -- Pharyngeal -- Pharyngeal- Multi-consistency -- Pharyngeal -- Pharyngeal- Pill -- Pharyngeal -- Pharyngeal Comment pt without awareness to residuals, penetration and aspiration did not fully clear despite cued cough, pt also with aspiration of secretions mixed with barium  CHL IP CERVICAL ESOPHAGEAL PHASE 12/17/2015 Cervical Esophageal Phase Impaired Pudding Teaspoon -- Pudding Cup -- Honey Teaspoon -- Honey Cup -- Nectar Teaspoon Reduced cricopharyngeal relaxation;Prominent cricopharyngeal segment Nectar Cup Reduced cricopharyngeal relaxation;Prominent cricopharyngeal segment Nectar Straw -- Thin Teaspoon Reduced cricopharyngeal relaxation;Prominent cricopharyngeal segment Thin Cup Reduced cricopharyngeal relaxation;Prominent cricopharyngeal segment Thin Straw Reduced cricopharyngeal relaxation;Prominent cricopharyngeal segment Puree -- Mechanical Soft -- Regular -- Multi-consistency -- Pill -- Cervical Esophageal Comment pt appeared with tight UES  Luanna Salk, MS [facility] SLP [phone]               Medications / Allergies: per chart  Antibiotics: Anti-infectives    Start     Dose/Rate Route Frequency Ordered Stop   12/09/15 1200  anidulafungin (ERAXIS) 100 mg in sodium chloride 0.9 % 100 mL IVPB     100 mg over 90 Minutes Intravenous Every 24 hours 12/09/15 0918 12/10/15 1333   12/05/15 1200  anidulafungin (ERAXIS) 100 mg in sodium chloride 0.9 % 100 mL IVPB  Status:  Discontinued     100 mg over 90 Minutes Intravenous  Every 24 hours 12/04/15 1045 12/09/15 0835   12/04/15 1200  anidulafungin (ERAXIS) 200 mg in sodium chloride 0.9 % 200 mL IVPB     200 mg over 180 Minutes Intravenous  Once 12/04/15 1045 12/04/15 1725   12/04/15 0600   cefoTEtan (CEFOTAN) 2 g in dextrose 5 % 50 mL IVPB     2 g 100 mL/hr over 30 Minutes Intravenous On call to O.R. 12/03/15 1821 12/03/15 2007   12/04/15 0400  piperacillin-tazobactam (ZOSYN) IVPB 3.375 g  Status:  Discontinued     3.375 g 12.5 mL/hr over 240 Minutes Intravenous Every 8 hours 12/03/15 2229 12/15/15 0751   12/02/15 0000  erythromycin ethylsuccinate (EES) 200 MG/5ML suspension 400 mg  Status:  Discontinued    Comments:  Administer via J tube (16Fr LLQ)   400 mg Oral Every 6 hours 12/01/15 2020 12/03/15 0748   11/27/15 1200  erythromycin 500 mg in sodium chloride 0.9 % 100 mL IVPB     500 mg 100 mL/hr over 60 Minutes Intravenous Every 6 hours 11/27/15 0940 11/29/15 0608   11/26/15 0830  erythromycin 250 mg in sodium chloride 0.9 % 100 mL IVPB  Status:  Discontinued     250 mg 100 mL/hr over 60 Minutes Intravenous Every 6 hours 11/26/15 0751 11/27/15 0940        Note: Portions of this report may have been transcribed using voice recognition software. Every effort was made to ensure accuracy; however, inadvertent computerized transcription errors may be present.   Any transcriptional errors that result from this process are unintentional.    [name], M.D., F.A.C.S. Gastrointestinal and Minimally Invasive Surgery [facility] [address], [place], [place] [address] [phone] Main / Paging     12/18/2015

## 2015-12-18 NOTE — Progress Notes (Addendum)
PCCM PROGRESS NOTE  Admission date: 11/24/2015 Consult date: 12/03/2015 Referring provider: Dr. Michaell Cowing  CC: Abdominal pain  BRIEF:  75 y/o male with peptic ulcer disease admitted on 10/10 with recurrent gastric outlet obstruction secondary to gastritis admitted underwent G and J tube placement this admission.  On 10/19 had to go back to the OR after he removed his G tube accidentally leading to peritonitis, intubation, septic shock.  Subjective:  Pulled out J tube overnight Remains off vent   Vital signs: BP (!) 150/56   Pulse (!) 110   Temp (!) 100.9 F (38.3 C) (Oral)   Resp (!) 32   Ht 6\' 2"  (1.88 m)   Wt 187 lb 13.3 oz (85.2 kg)   SpO2 97%   BMI 24.12 kg/m   Intake/output: I/O last 3 completed shifts: In: 4174.1 [I.V.:2670.4; ZOXWR:604; VW/UJ:8119.1; IV Piggyback:54] Out: 5725 [Urine:3375; Drains:2350]   : FiO2 (%):  [28 %] 28 %  General:chronically ill appearing  HENT: NCAT Tracheostomy clean, dry, intact PULM: CTA B, normal effort CV: RRR, no mgr GI: BS+, G and J  in place Derm: no rash, mild edema unchanged Neuro: awake, but more sleepy today, not oriented, myoclonic jerking  CMP Latest Ref Rng & Units 12/18/2015 12/17/2015 12/17/2015  Glucose 65 - 99 mg/dL 478(G) - 956(O)  BUN 6 - 20 mg/dL 13(Y) - 86(V)  Creatinine 0.61 - 1.24 mg/dL 7.84 - 6.96  Sodium 295 - 145 mmol/L 147(H) - 147(H)  Potassium 3.5 - 5.1 mmol/L 3.8 3.7 3.9  Chloride 101 - 111 mmol/L 119(H) - 120(H)  CO2 22 - 32 mmol/L 25 - 23  Calcium 8.9 - 10.3 mg/dL 8.2(L) - 8.2(L)  Total Protein 6.5 - 8.1 g/dL - - 5.6(L)  Total Bilirubin 0.3 - 1.2 mg/dL - - 1.5(H)  Alkaline Phos 38 - 126 U/L - - 144(H)  AST 15 - 41 U/L - - 24  ALT 17 - 63 U/L - - 21    CBC Latest Ref Rng & Units 12/15/2015 12/14/2015 12/13/2015  WBC 4.0 - 10.5 K/uL 10.2 7.7 9.8  Hemoglobin 13.0 - 17.0 g/dL 2.8(U) 10.3(L) 8.6(L)  Hematocrit 39.0 - 52.0 % 26.4(L) 31.0(L) 25.0(L)  Platelets 150 - 400 K/uL 633(H) 516(H) 553(H)    ABG    Component Value Date/Time   PHART 7.344 (L) 12/07/2015 2230   PCO2ART 31.4 (L) 12/07/2015 2230   PO2ART 132 (H) 12/07/2015 2230   HCO3 16.7 (L) 12/07/2015 2230   TCO2 26 12/04/2007 0638   ACIDBASEDEF 7.6 (H) 12/07/2015 2230   O2SAT 98.3 12/07/2015 2230   CBG (last 3)   Recent Labs  12/17/15 2358 12/18/15 0407 12/18/15 0807  GLUCAP 138* 128* 129*   Imaging: Dg Swallowing Func-speech Pathology  Result Date: 12/17/2015 Objective Swallowing Evaluation: Type of Study: MBS-Modified Barium Swallow Study Patient Details Name: [name] MRN: [id number] Date of Birth: 1941-01-26 Today's Date: 12/17/2015 Time: SLP Start Time (ACUTE ONLY): 0925-SLP Stop Time (ACUTE ONLY): 0945 SLP Time Calculation (min) (ACUTE ONLY): 20 min Past Medical History: Past Medical History: Diagnosis Date . Bilateral dry eyes  . Coronary artery disease 1997  bare mental stent right coronart artery . Degenerative disc disease   LOWER BACK . Dysphonia  . ED (erectile dysfunction)  . Gastroesophageal reflux disease  . Hyperlipidemia  . Hypertension  . Peptic ulcer  . Pneumonia   HISTORY OF IN CHILDHOOD . Posterior vitreous detachment 10/30/2012 . Urinary hesitancy  . Wears glasses  Past Surgical History: Past Surgical  History: Procedure Laterality Date . CARDIAC CATHETERIZATION   . CHOLECYSTECTOMY   . ESOPHAGOGASTRODUODENOSCOPY N/A 12/01/2015  Procedure: ESOPHAGOGASTRODUODENOSCOPY (EGD) BALLOON DILATION OF DUODENAL STRICTURE;  Surgeon: Karie Soda, MD;  Location: [facility];  Service: General;  Laterality: N/A; . ESOPHAGOGASTRODUODENOSCOPY N/A 12/14/2015  Procedure: ESOPHAGOGASTRODUODENOSCOPY (EGD);  Surgeon: Vida Rigger, MD;  Location: [facility];  Service: Endoscopy;  Laterality: N/A;  Patient has tracheostomy . ESOPHAGOGASTRODUODENOSCOPY (EGD) WITH PROPOFOL N/A 07/01/2015  Procedure: ESOPHAGOGASTRODUODENOSCOPY (EGD) WITH PROPOFOL;  Surgeon: Willis Modena, MD;  Location: [facility];  Service: Endoscopy;  Laterality:  N/A; . ESOPHAGOGASTRODUODENOSCOPY (EGD) WITH PROPOFOL N/A 11/23/2015  Procedure: ESOPHAGOGASTRODUODENOSCOPY (EGD) WITH PROPOFOL;  Surgeon: Willis Modena, MD;  Location: [facility];  Service: Endoscopy;  Laterality: N/A;  may need to intubate . EYE SURGERY    growth on right eye removed  . IR GENERIC HISTORICAL  12/13/2015  IR REPLC DUODEN/JEJUNO TUBE PERCUT W/FLUORO 12/13/2015 [name], MD [facility]-INTERV RAD . LAPAROSCOPIC GASTROSTOMY N/A 12/01/2015  Procedure: LAPAROSCOPIC PLACEMENT OF FEEDING JEJUNOSTOMY AND GASTROSTOMY TUBE;  Surgeon: Karie Soda, MD;  Location: [facility];  Service: General;  Laterality: N/A; . LAPAROSCOPY N/A 12/03/2015  Procedure: LAPAROSCOPY DIAGNOSTIC, OMENTOPEXY, JEJUNOSTOMY, WASH OUT;  Surgeon: Karie Soda, MD;  Location: [facility];  Service: General;  Laterality: N/A; . STENT TO HEART    2 1997 AND 1 IN 1996 . TONSILLECTOMY   . XI ROBOTIC VAGOTOMY AND ANTRECTOMY N/A 09/25/2015  Procedure: XI ROBOTIC ANTERIOR AND POSTERIOR VAGOTOMY, BILROTH I  ANASTOMOSIS DOR FUNDIPLICATION, OMENTOPEXY  UPPER ENDOSCOPY;  Surgeon: Karie Soda, MD;  Location: [facility];  Service: General;  Laterality: N/A; HPI: 75 yo male former smoker with progressive epigastric pain, nausea, vomiting, bloating from gastric outlet obstruction with pyloric channel ulcer.  Had Billroth 1, vagotomy, fundoplication, omentopexy August 2017 with initial improvement.  Had recurrent symptoms September 2017 that become progressively worse with gastric dilation from recurrent stricture.  Had laparoscopic placement of jejunostomy tube, gastrostomy tube, and EGD balloon dilation of duodenal stricture 10/17.  Developed gastric leakage with peritonitis after pulling out PEG tube 10/19 and taken to OR. On 10/27 found 2 new LUQ abscesses/fluid collections which were drained percutaneously by IR. Had perc trach 10/27.  As of 10/30 his mental status has improved, he has remained off of vasopressors for several days and he has done well with weaning  off the ventilator per MD note.  Subjective: pt awake in bed Assessment / Plan / Recommendation CHL IP CLINICAL IMPRESSIONS 12/17/2015 Therapy Diagnosis Moderate oral phase dysphagia;Moderate pharyngeal phase dysphagia;Moderate cervical esophageal phase dysphagia Clinical Impression Patient presents with moderate oropharyngeal dysphagia characterized by significant weakness resulting in residuals and aspiration.  Pt was only given minimal amount of thin and nectar barium during testing.  Of note, after patient was transferred to flouro chair, he demonstrated copious coughing.  He cleared scant secretions through trach tube at that time.  Transferring to chair and coughing episode significantly fatigued patient.  PMSV was placed for MBS with good tolerance during testing.  Unfortunately patient is grossly weak resulting in oropharyngeal deficits and secretion retention that mixed with barium.  Delayed oral transiting, lingual pumping and premature spillage of barium into pharynx noted due to weakness.  Compromised tongue base retraction and laryngeal elevation allowed laryngeal penetration and SILENT aspiration both during and after the swallow.  Cues to cough/throat clear and dry swallow did not fully clear penetrates and patient quickly re-penetrated pharyngeal residuals.  In addition patient appeared with decreased proximal esophageal opening/decreased CP opening resulting  in residuals in pyriform sinus.  Unfortunately patient is at high risk of aspiration with intake at this time.  Using teach back/live video educated patient to findings and reinforced effective compensation strategies.   Recommend continue NPO except single ice chips with strict precautions - oral care, pmsv in place and cueing patient to cough/clear throat and re-swallow.  Pt will need repeat MBS in future due to silent nature of dysphagia. Will follow up for dysphagia treatment/management.   Thanks for allowing me to help care for this patient.   Impact on safety and function Severe aspiration risk;Risk for inadequate nutrition/hydration   CHL IP TREATMENT RECOMMENDATION 12/17/2015 Treatment Recommendations F/U MBS in --- days (Comment)   Prognosis 12/17/2015 Prognosis for Safe Diet Advancement Fair Barriers to Reach Goals Severity of deficits;Cognitive deficits Barriers/Prognosis Comment -- CHL IP DIET RECOMMENDATION 12/17/2015 SLP Diet Recommendations NPO;Ice chips PRN after oral care Liquid Administration via -- Medication Administration Via alternative means Compensations Minimize environmental distractions;Slow rate;Multiple dry swallows after each bite/sip;Hard cough after swallow Postural Changes --   CHL IP OTHER RECOMMENDATIONS 12/17/2015 Recommended Consults -- Oral Care Recommendations Oral care QID Other Recommendations --   CHL IP FOLLOW UP RECOMMENDATIONS 12/17/2015 Follow up Recommendations Skilled Nursing facility;Inpatient Rehab   CHL IP FREQUENCY AND DURATION 12/17/2015 Speech Therapy Frequency (ACUTE ONLY) min 2x/week Treatment Duration 2 weeks      CHL IP ORAL PHASE 12/17/2015 Oral Phase Impaired Oral - Pudding Teaspoon -- Oral - Pudding Cup -- Oral - Honey Teaspoon -- Oral - Honey Cup -- Oral - Nectar Teaspoon -- Oral - Nectar Cup Weak lingual manipulation;Reduced posterior propulsion;Delayed oral transit;Decreased bolus cohesion;Lingual pumping Oral - Nectar Straw -- Oral - Thin Teaspoon Weak lingual manipulation;Delayed oral transit;Decreased bolus cohesion;Reduced posterior propulsion;Lingual pumping Oral - Thin Cup Weak lingual manipulation;Reduced posterior propulsion;Delayed oral transit;Decreased bolus cohesion;Lingual pumping Oral - Thin Straw Weak lingual manipulation;Decreased bolus cohesion;Delayed oral transit;Reduced posterior propulsion;Lingual pumping Oral - Puree -- Oral - Mech Soft -- Oral - Regular -- Oral - Multi-Consistency -- Oral - Pill -- Oral Phase - Comment --  CHL IP PHARYNGEAL PHASE 12/17/2015 Pharyngeal Phase  Impaired Pharyngeal- Pudding Teaspoon -- Pharyngeal -- Pharyngeal- Pudding Cup -- Pharyngeal -- Pharyngeal- Honey Teaspoon -- Pharyngeal -- Pharyngeal- Honey Cup -- Pharyngeal -- Pharyngeal- Nectar Teaspoon Reduced laryngeal elevation;Pharyngeal residue - pyriform;Pharyngeal residue - valleculae;Reduced tongue base retraction;Reduced airway/laryngeal closure;Reduced epiglottic inversion;Penetration/Aspiration during swallow Pharyngeal Material enters airway, passes BELOW cords without attempt by patient to eject out (silent aspiration) Pharyngeal- Nectar Cup Reduced laryngeal elevation;Reduced tongue base retraction;Reduced anterior laryngeal mobility;Reduced epiglottic inversion;Reduced airway/laryngeal closure;Penetration/Aspiration during swallow Pharyngeal Material enters airway, passes BELOW cords without attempt by patient to eject out (silent aspiration) Pharyngeal- Nectar Straw -- Pharyngeal -- Pharyngeal- Thin Teaspoon Reduced laryngeal elevation;Reduced airway/laryngeal closure;Reduced tongue base retraction;Penetration/Aspiration during swallow;Reduced epiglottic inversion;Trace aspiration;Pharyngeal residue - valleculae;Pharyngeal residue - pyriform;Pharyngeal residue - cp segment Pharyngeal Material enters airway, passes BELOW cords without attempt by patient to eject out (silent aspiration) Pharyngeal- Thin Cup Moderate aspiration;Penetration/Aspiration during swallow;Penetration/Apiration after swallow;Reduced pharyngeal peristalsis;Reduced epiglottic inversion;Reduced laryngeal elevation;Reduced airway/laryngeal closure;Reduced tongue base retraction Pharyngeal Material enters airway, passes BELOW cords without attempt by patient to eject out (silent aspiration) Pharyngeal- Thin Straw Reduced pharyngeal peristalsis;Reduced epiglottic inversion;Reduced anterior laryngeal mobility;Reduced laryngeal elevation;Reduced airway/laryngeal closure;Pharyngeal residue - valleculae;Pharyngeal residue -  pyriform;Moderate aspiration;Penetration/Aspiration during swallow Pharyngeal Material enters airway, passes BELOW cords without attempt by patient to eject out (silent aspiration) Pharyngeal- Puree -- Pharyngeal -- Pharyngeal- Mechanical Soft -- Pharyngeal -- Pharyngeal- Regular --  Pharyngeal -- Pharyngeal- Multi-consistency -- Pharyngeal -- Pharyngeal- Pill -- Pharyngeal -- Pharyngeal Comment pt without awareness to residuals, penetration and aspiration did not fully clear despite cued cough, pt also with aspiration of secretions mixed with barium  CHL IP CERVICAL ESOPHAGEAL PHASE 12/17/2015 Cervical Esophageal Phase Impaired Pudding Teaspoon -- Pudding Cup -- Honey Teaspoon -- Honey Cup -- Nectar Teaspoon Reduced cricopharyngeal relaxation;Prominent cricopharyngeal segment Nectar Cup Reduced cricopharyngeal relaxation;Prominent cricopharyngeal segment Nectar Straw -- Thin Teaspoon Reduced cricopharyngeal relaxation;Prominent cricopharyngeal segment Thin Cup Reduced cricopharyngeal relaxation;Prominent cricopharyngeal segment Thin Straw Reduced cricopharyngeal relaxation;Prominent cricopharyngeal segment Puree -- Mechanical Soft -- Regular -- Multi-consistency -- Pill -- Cervical Esophageal Comment pt appeared with tight UES  Danny Burnet, MS [facility] SLP [phone]             PCXR w/ minimal L base atx. Aeration improved. Trach good position.   Studies: 10/11 CT abdomen/pelvis> significant distension of the stomach with layering fluid and gas, NG present, worrisome for recurrent gastric outlet obstruction.  RLL consolidation, cholecycstectomy 10/26 CT abdomen/pelvis> Moderate dilatation of proximal and mid small bowel loops, with transition point in anterior right lower quadrant, suspicious for partial small bowel obstruction. Pneumatosis involving multiple dilated small bowel loops in the left abdomen pelvis, suspicious for bowel ischemia. No evidence of portal venous gas or free intraperitoneal air. Two left  upper quadrant fluid collections adjacent to the stomach and spleen, which may represent postoperative fluid collections or abscesses. Small amount free fluid also noted in pelvis.Small bilateral pleural effusions and bibasilar atelectasis. Diffuse mesenteric and body wall edema. 11/2 Modified barium swallow> silent aspiration  Cultures: Blood 10/19 >> neg Fluid cx 10/17 >> neg  Antibiotics: Zosyn 10/19 >>  Anidulafungin 10/19 >> 10/26   Events: 10/10  admission 10/17  Lap placement of J & G tubes, EGD w/ balloon dilation of duodenal stricture, gastric biopsies, closure of gastrotomy 10/18  sundowning, pulled out G tube 10/19  laparaoscopy (findings gastric leak, bilious ascites, jejunal disruption), omentoplexy of jejunal disruption, gastric leak 10/24 extubated. Required re-intubation.  10/25: placed on precedex for acute encephalopathy; new drainage from J tube. tubefeeds stopped.   10/26 Heparin off since this am (as recommended by vascular). Still on pressors. Getting albumin and lasix to a/w mobilizing third spaced fluid/anasarca. Developed rectal bleeding. This has initiated after starting bowel prep. Hgb has drifted from 8 to 7.7 --> stopped [place] heparin. CT found evidence of bowel pneumotosis worrisome for ischemia, and two LUQ fluid collections 10/27: trach placed. TO IR for Perc drains to be placed in LUQ 10/30 EGD : mild esophagitis, evidence of billroth procedure, gastric pouch, non-bleeding duodenal ulcers, non-bleeding jejunal ulcers, could not reach j - tube 10/31: PMSV trial, pt tolerated well 11/1: >24 hours on trach collar; PSMV in place   Lines/tube: 10/19 ETT >> 10/24 then replaced 10/24 > 10/27 10/19 CVL left subclavian >>  10/19 R brachial arterial line >> 10/20 10/19 abdominal drain x3 >> 10/19 G tube >> 10/19 J tube >> 10/27 Trach placed (feinstein # 6 cuffed), Abd drains by IR 10/29 J dislodged, replaced by IR 11/3 early AM pulled out J tube, replaced by  surgery  Summary: 74 yo male former smoker with progressive epigastric pain, nausea, vomiting, bloating from gastric outlet obstruction with pyloric channel ulcer.  Had Billroth 1, vagotomy, fundoplication, omentopexy August 2017 with initial improvement.  Had recurrent symptoms September 2017 that become progressively worse with gastric dilation from recurrent stricture.  Had laparoscopic placement of jejunostomy tube, gastrostomy  tube, and EGD balloon dilation of duodenal stricture 10/17.  Developed gastric leakage with peritonitis after pulling out PEG tube 10/19 and taken to OR. On 10/27 found 2 new LUQ abscesses/fluid collections which were drained percutaneously by IR. Had perc trach 10/27.    Has remained off of ventilatory support for > 72 hours as of 11/3, however I worry he may be hypercarbic based on his mental status and myoclonic jerks.    PMHx of CAD s/p stent, GERD, HTN, HLD  Assessment/plan:  Acute respiratory failure > is he hypercarbic this AM? Tracheostomy status.  - check ABG, may need to go back to nocturnal vent - tracheostomy collar O2 supplementation to maintain O2 > 90% - PMSV in place, tolerating well - Cont routine trach care  - can remove sutures 11/3, if stays off vent can change to cuffless tracheostomy at day 14 - would consider decannulation only when he is up walking around, taking by mouth  Recurrent gastric outlet obstruction GI bleeding> source inflamed small bowel? > resolved Abdominal abscess/fluid collections s/p IR drains Protein-calorie malnutrition Dislodged J overnight 11/3 - G and J tube management per surgery - nutrition per GI surgery - TPN per surgery - monitor off of antibiotics - Follow fluid cx from IR drainage - SLP eval  Hypertension with Sinus tachycardia - will add low dose metoprolol  AKI >> resolved Hypernatremia  Hyperchloremia  - monitor renal function, urine outpt - d/c D5W given hyperglycemia - would add free water  to tube feedings if OK by surgery (100 mL q4hr to start with or even 50mL q2h) - f/u am chemistry  - replace electrolytes as needed  Acute metabolic encephalopathy > worsening 11/3 Post-op pain Severe Physical deconditioning  - check ABG - PRN fent for pain - haldol prn for severe agitation  - continue scheduled haldol, monitor QTc on tele monitor  Rt index finger ischemia likely from Rt brachial a line. > improved, no clot on upper ext doppler ultrasound - VVS consulted 10/20, appreciate input-->resolved  - no more Arterial lines right arm  Hyperglycemia - SSI  Anasarca - cont nutritional support    SUP - Famotidine IV DVT prophylaxis - SCDs Goals of care - Full code   Danny CarolinaBrent Midge Momon, MD [facility] Pager: [phone] Cell: [phone] After 3pm or if no response, call [phone]   12/18/2015, 9:10 AM

## 2015-12-18 NOTE — Progress Notes (Signed)
I assumed care of the patient at 1600. Pt was alert and oriented in no apparent distress when bedside report was given. Gtube and Jtube flushes performed without complication. Dressing change to both sites and a new abdominal binder placed. No complications at this time. Will continue to monitor.

## 2015-12-18 NOTE — Progress Notes (Signed)
[place] NOTE   Pharmacy Consult for TPN  Patient Measurements: Height: 6' 2"  (188 cm) Weight: 187 lb 13.3 oz (85.2 kg) IBW/kg (Calculated) : 82.2 TPN AdjBW (KG): 97.2 Body mass index is 24.12 kg/m.  Insulin Requirements: 5 units Novolog in past 24 hours  Current Nutrition: NPO  IVF: D5W 14m/hr - stopped 11/2 by PCCM d/t higher CBGs  Central access: PICC placed 10/12, pt pulled out 10/19. CVC in place since 10/19 TPN date: 10/12-10/19, resumed 10/25  ASSESSMENT                                                                                                          HPI: [id number] admitted 10/9 with new upper abdominal discomfort and bloating with concern for recurrent GOO due to chronic pyloric stricture. 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  Pharmacy consulted to start TPN for gastric ileus and obstruction 10/12. Patient underwent G and J tube placement this admission. On 10/19, patient pulled out PICC line. He also had to go back to OR after he removed his G tube accidentally, leading to peritonitis, intubation, and septic shock. Tube feed rate slowly advanced by CCS, but today patient noted to have dark brown output around J tube site, and tube feeds held. Pharmacy asked to resume TPN.   10/30: EGD - esophagitis, edema/erythematous stomach, stenosed Billroth I gastroduodenostomy,  Gastric pouch w/ ulceration, gastroduodenal anastomosis with ulceration, non-bleeding ulcers found in duodenum and jejunum.  10/31 and 11/1: peritoneal drains removed by IR  Today, 12/18/15:   Glucose - No hx DM noted. CBGs at goal <150.  5 units novolog SSI/24h  Electrolytes - K 3.8 imrpoved after 2 runs KCl yest), Na and Cl- high (stable) Clinimix has 35 meq/L of Na+ so not likely contributing much to elevated Na, all other lytes including Corrected Ca WNL  PCCM suggests free fluid via tube (if CCS  agrees)  Renal - AKI -resolved, Scr increased overnight but still WNL,  I/O: -1547.337m24 (drains [id number])  LFTs - AST/ALT WNL, Alk Phos and Tbili slightly elevated  TGs -76 (10/13), 97 (10/16), 214 (10/26), 194 (10/30)  Prealbumin - 17.8 (10/11), 12.8 (10/13), 11 (10/16), 12.5 (10/18), < 5 (10/23), 6.4 (10/26), 9.2 (10/30)  Pt pulled out J tube, feeds on hold will retry trophic TF in a few days  NUTRITIONAL GOALS                                                                                             RD recs (as of 10/27): KcaOZHY:[id number]:111-138 grams (1.2-1.5 grams/kg)  Clinimix 5/15 at a  goal rate of 38m/hr would provide 108g/day protein, 2013Kcal/day.  **There is currently a nPsychologist, prison and probation servicesof Clinimix solution. No 2L bags of Clinimix E 5/20 currently available. To conserve supply, will consider goal rate of Clinimix 5/15 at 83 ml/hr + 20% fat emulsion at 140mhr, which will keep total volume at 2L rather than increasing use to 3L.   PLAN                                                                                           At 1800 today:  Continue Clinimix E 5/15 to 83 ml/hr (max rate).   Continue electrolytes in TPN despite mild hypernatremia.    20% fat emulsion at 10 ml/hr.  TPN to contain standard multivitamins and trace elements.  IVF per MD.  Continue sensitive SSI q4h.   TPN lab panels on Mondays & Thursdays.    F/u daily.   ElDolly RiasPh 12/18/2015, 8:58 AM Pager [phone]

## 2015-12-18 NOTE — Progress Notes (Signed)
Nutrition Follow-up  DOCUMENTATION CODES:   Severe malnutrition in context of acute illness/injury  INTERVENTION:  - Continue TPN per pharmacy. - Re-start TF as medically feasible. - Goal for TF: Vital 1.2 @ 70 mL/hr. - RD will follow-up 11/6.  NUTRITION DIAGNOSIS:   Inadequate oral intake related to inability to eat as evidenced by NPO status. -ongoing  GOAL:   Patient will meet greater than or equal to 90% of their needs -met with TPN regimen alone.  MONITOR:   Weight trends, Labs, Skin, I & O's, Other (Comment) (TPN regimen and ability to restart TF)  ASSESSMENT:   75 year old male presenting for a post-operative visit. Note for "Post-Operative": Patient returns one month status post robotic resection.  Distal gastrectomy with Billroth I gastroduodenal reconstruction for chronic ulcer causing obstruction.  09/25/2015  11/3 Pt continues with trach collar. RN note from this AM at [phone] states: Patient's J tube not in place, and G tube sutures are loose.  Per Dr. Johney Maine' note at [phone]: pt had pulled J-tube ~1.5 hours prior to RN note and new J-tube (16 [place]) now in place, xray confirmation pending, and plan for J-tube to gravity over the weekend and re-start trophic TF in a few days.   Pt continues with Clinimix E 5/15 @ 83 mL/hr with 20% ILE @ 10 mL/hr. This regimen is providing 1900 kcal and 100 grams of protein.  Weight +1.6 kg from yesterday; will continue to monitor weight fluctuations. MBS done yesterday and note associated with this (1233 yesterday) reviewed.   Medications reviewed; 20 mg IV Pepcid BID, sliding scale Novolog, PRN IV Zofran, 1 g Carafate QID.  Labs reviewed; CBGs: 128 and 129 mg/dL this AM, Na: 147 mmol/L, Cl: 119 mmol/L, Ca: 8.2 mg/dL, BUN: 46 mg/dL, GFR: 55 mL/min, no recent triglyceride values.     11/2 - Pt continues with trach collar.  - Estimated kcal need updated this AM as weight -2.6 kg since yesterday.  - Will continue to monitor weight trends  and adjust as needed.  - He is receiving TPN: Clinimix E 5/15 @ 83 mL/hr with 20% ILE @ 10 mL/hr which is providing 1900 kcal and 100 grams of protein (100% estimated kcal and protein need).  - Per Dr. Johney Maine' note this AM, plan to increase Vital 1.2 to 40 mL/hr today (1152 kcal, 72 grams of protein, and 779 mL free water).  - Spoke with Pharmacist about TPN and plans for possible weaning and d/c of TPN in the near future.  - Goal for TF: Vital 1.2 @ 70 mL/hr which will provide 2016 kcal, 126 grams of protein (108% max re-estimated protein need), and 1362 mL free water.  - Pt out of room at time of RD visit.  - Family in the room and spoke with them about current plan for TF advancement. - Per rounds this AM, pt had MBS this AM and was unable to pass; will monitor for note associated to this and ongoing SLP plan.  - Also per rounds, G-tube continues to drain yellow bile-like contents.     11/1 - Pt on trach collar with PMV in place at time of visit.  - He denies any abdominal pain or nausea.  - Pt continues to receiving Clinimix E 5/15 @ 83 mL/hr with 20% ILE @ 10 mL/hr; spoke with Pharmacist who reports plan to continue this regimen today and will continue to monitor for ability to increase TF rate and beginning weaning TPN.  - Weight  stable from yesterday.  - Per Dr. Johney Maine' note this AM, plan to advance Vital 1.2 to 30 mL/hr today.  - Order currently in place and RN had not yet had a chance to increase rate prior to RD visit. - SLP note from today states plan for MBS this afternoon.   Diet Order:  Diet NPO time specified Except for: Ice Chips TPN (CLINIMIX-E) Adult TPN (CLINIMIX-E) Adult  Skin:  Wound (see comment) (Abdominal incision)  Last BM:  11/2  Height:   Ht Readings from Last 1 Encounters:  12/08/15 6' 2"  (1.88 m)    Weight:   Wt Readings from Last 1 Encounters:  12/18/15 187 lb 13.3 oz (85.2 kg)    Ideal Body Weight:  80.9 kg  BMI:  Body mass index is 24.12  kg/m.  Estimated Nutritional Needs:   Kcal:  1840-2090 (22-25 kcal/kg)  Protein:  100-117 grams (1.2-1.4 grams/kg)  Fluid:  per MD/NP/Surgery  EDUCATION NEEDS:   No education needs identified at this time    Jarome Matin, MS, RD, LDN Inpatient Clinical Dietitian Pager # [phone] After hours/weekend pager # [phone]

## 2015-12-18 NOTE — Progress Notes (Signed)
Patient's J tube not in place, and G tube sutures are loose. CCM and Surgery aware. Will continue to monitor patient.

## 2015-12-19 LAB — GLUCOSE, CAPILLARY
GLUCOSE-CAPILLARY: 146 mg/dL — AB (ref 65–99)
GLUCOSE-CAPILLARY: 134 mg/dL — AB (ref 65–99)
Glucose-Capillary: 133 mg/dL — ABNORMAL HIGH (ref 65–99)
Glucose-Capillary: 137 mg/dL — ABNORMAL HIGH (ref 65–99)
Glucose-Capillary: 138 mg/dL — ABNORMAL HIGH (ref 65–99)
Glucose-Capillary: 148 mg/dL — ABNORMAL HIGH (ref 65–99)

## 2015-12-19 MED ORDER — FREE WATER
100.0000 mL | Freq: Three times a day (TID) | Status: DC
Start: 2015-12-19 — End: 2015-12-19

## 2015-12-19 MED ORDER — FAT EMULSION 20 % IV EMUL
250.0000 mL | INTRAVENOUS | Status: AC
  Administered 2015-12-19: 250 mL via INTRAVENOUS
  Filled 2015-12-19 (×2): qty 250

## 2015-12-19 MED ORDER — TRACE MINERALS CR-CU-MN-SE-ZN 10-1000-500-60 MCG/ML IV SOLN
INTRAVENOUS | Status: AC
  Administered 2015-12-19: 17:00:00 via INTRAVENOUS
  Filled 2015-12-19 (×2): qty 1992

## 2015-12-19 NOTE — Progress Notes (Signed)
PCCM PROGRESS NOTE  Admission date: 11/24/2015 Consult date: 12/03/2015 Referring provider: Dr. Michaell Cowing  CC: Abdominal pain  BRIEF:  75 y/o male with peptic ulcer disease admitted on 10/10 with recurrent gastric outlet obstruction secondary to gastritis admitted underwent G and J tube placement this admission.  On 10/19 had to go back to the OR after he removed his G tube accidentally leading to peritonitis, intubation, septic shock.  Subjective:  J tube replaced 11/3 Currently tolerating PMV, uses while supervised.  No hypercapnia on ABG from 11/3 am   Vital signs: BP 133/62   Pulse 93   Temp 98.2 F (36.8 C) (Oral)   Resp (!) 22   Ht 6\' 2"  (1.88 m)   Wt 82 kg (180 lb 12.4 oz)   SpO2 99%   BMI 23.21 kg/m   Intake/output: I/O last 3 completed shifts: In: 2206 [I.V.:1674; NG/GT:432; IV Piggyback:100] Out: 4550 [Urine:2550; Drains:2000]   : FiO2 (%):  [28 %] 28 %  General:chronically ill appearing  HENT: NCAT Tracheostomy clean, dry, intact, PMV in place, weak but audible voice.  PULM: CTA B, normal effort CV: RRR, no mgr GI: BS+, G and J  in place Derm: no rash, mild edema unchanged Neuro: awake, answers questions, globally weak but non-focal  CMP Latest Ref Rng & Units 12/18/2015 12/17/2015 12/17/2015  Glucose 65 - 99 mg/dL 409(W) - 119(J)  BUN 6 - 20 mg/dL 47(W) - 29(F)  Creatinine 0.61 - 1.24 mg/dL 6.21 - 3.08  Sodium 657 - 145 mmol/L 147(H) - 147(H)  Potassium 3.5 - 5.1 mmol/L 3.8 3.7 3.9  Chloride 101 - 111 mmol/L 119(H) - 120(H)  CO2 22 - 32 mmol/L 25 - 23  Calcium 8.9 - 10.3 mg/dL 8.2(L) - 8.2(L)  Total Protein 6.5 - 8.1 g/dL - - 5.6(L)  Total Bilirubin 0.3 - 1.2 mg/dL - - 1.5(H)  Alkaline Phos 38 - 126 U/L - - 144(H)  AST 15 - 41 U/L - - 24  ALT 17 - 63 U/L - - 21    CBC Latest Ref Rng & Units 12/18/2015 12/15/2015 12/14/2015  WBC 4.0 - 10.5 K/uL 10.6(H) 10.2 7.7  Hemoglobin 13.0 - 17.0 g/dL 8.4(O) 9.6(E) 10.3(L)  Hematocrit 39.0 - 52.0 % 26.0(L)  26.4(L) 31.0(L)  Platelets 150 - 400 K/uL 573(H) 633(H) 516(H)   ABG    Component Value Date/Time   PHART 7.465 (H) 12/18/2015 1030   PCO2ART 32.8 12/18/2015 1030   PO2ART 76.2 (L) 12/18/2015 1030   HCO3 23.3 12/18/2015 1030   TCO2 26 12/04/2007 0638   ACIDBASEDEF 7.6 (H) 12/07/2015 2230   O2SAT 95.1 12/18/2015 1030   CBG (last 3)   Recent Labs  12/18/15 2017 12/19/15 0009 12/19/15 0751  GLUCAP 88 148* 138*   Imaging: Dg Abd 1 View  Result Date: 12/18/2015 CLINICAL DATA:  Jejunostomy tube replacement. EXAM: ABDOMEN - 1 VIEW COMPARISON:  Procedural spot fluoroscopic 12/13/2015. CT abdomen and pelvis 12/10/2015. FINDINGS: Percutaneous jejunostomy tube overlies the left mid abdomen. Injected contrast opacifies an adjacent small bowel loop. A small amount of contrast is partially visualized in the left upper quadrant in the expected region of the GE junction and may be within the distal esophagus and stomach. Abdominal surgical clips are present. Gas is present in grossly nondilated loops of small and large bowel to the level of the rectum without evidence of obstruction. IMPRESSION: Satisfactory positioning of percutaneous jejunostomy tube based on appearance of injected contrast material. Electronically Signed   By: Freida Busman  Mosetta PuttGrady M.D.   On: 12/18/2015 10:13   Dg Swallowing Func-speech Pathology  Result Date: 12/17/2015 Objective Swallowing Evaluation: Type of Study: MBS-Modified Barium Swallow Study Patient Details Name: [name] MRN: [id number] Date of Birth: 1940/03/06 Today's Date: 12/17/2015 Time: SLP Start Time (ACUTE ONLY): 0925-SLP Stop Time (ACUTE ONLY): 0945 SLP Time Calculation (min) (ACUTE ONLY): 20 min Past Medical History: Past Medical History: Diagnosis Date . Bilateral dry eyes  . Coronary artery disease 1997  bare mental stent right coronart artery . Degenerative disc disease   LOWER BACK . Dysphonia  . ED (erectile dysfunction)  . Gastroesophageal reflux disease  .  Hyperlipidemia  . Hypertension  . Peptic ulcer  . Pneumonia   HISTORY OF IN CHILDHOOD . Posterior vitreous detachment 10/30/2012 . Urinary hesitancy  . Wears glasses  Past Surgical History: Past Surgical History: Procedure Laterality Date . CARDIAC CATHETERIZATION   . CHOLECYSTECTOMY   . ESOPHAGOGASTRODUODENOSCOPY N/A 12/01/2015  Procedure: ESOPHAGOGASTRODUODENOSCOPY (EGD) BALLOON DILATION OF DUODENAL STRICTURE;  Surgeon: Karie SodaSteven Gross, MD;  Location: [facility];  Service: General;  Laterality: N/A; . ESOPHAGOGASTRODUODENOSCOPY N/A 12/14/2015  Procedure: ESOPHAGOGASTRODUODENOSCOPY (EGD);  Surgeon: Vida RiggerMarc Magod, MD;  [facility];  Service: Endoscopy;  Laterality: N/A;  Patient has tracheostomy . ESOPHAGOGASTRODUODENOSCOPY (EGD) WITH PROPOFOL N/A 07/01/2015  Procedure: ESOPHAGOGASTRODUODENOSCOPY (EGD) WITH PROPOFOL;  Surgeon: Willis ModenaWilliam Outlaw, MD;  Location: [facility];  Service: Endoscopy;  Laterality: N/A; . ESOPHAGOGASTRODUODENOSCOPY (EGD) WITH PROPOFOL N/A 11/23/2015  Procedure: ESOPHAGOGASTRODUODENOSCOPY (EGD) WITH PROPOFOL;  Surgeon: Willis ModenaWilliam Outlaw, MD;  [facility];  Service: Endoscopy;  Laterality: N/A;  may need to intubate . EYE SURGERY    growth on right eye removed  . IR GENERIC HISTORICAL  12/13/2015  IR REPLC DUODEN/JEJUNO TUBE PERCUT W/FLUORO 12/13/2015 Jolaine ClickArthur Hoss, MD [facility] . LAPAROSCOPIC GASTROSTOMY N/A 12/01/2015  Procedure: LAPAROSCOPIC PLACEMENT OF FEEDING JEJUNOSTOMY AND GASTROSTOMY TUBE;  Surgeon: Karie SodaSteven Gross, MD;  Location: [facility];  Service: General;  Laterality: N/A; . LAPAROSCOPY N/A 12/03/2015  Procedure: LAPAROSCOPY DIAGNOSTIC, OMENTOPEXY, JEJUNOSTOMY, WASH OUT;  Surgeon: Karie SodaSteven Gross, MD;  Location: [facility];  Service: General;  Laterality: N/A; . STENT TO HEART    2 1997 AND 1 IN 1996 . TONSILLECTOMY   . XI ROBOTIC VAGOTOMY AND ANTRECTOMY N/A 09/25/2015  Procedure: XI ROBOTIC ANTERIOR AND POSTERIOR VAGOTOMY, BILROTH I  ANASTOMOSIS DOR FUNDIPLICATION, OMENTOPEXY  UPPER  ENDOSCOPY;  Surgeon: Karie SodaSteven Gross, MD;  Location: [facility];  Service: General;  Laterality: N/A; HPI: 75 yo male former smoker with progressive epigastric pain, nausea, vomiting, bloating from gastric outlet obstruction with pyloric channel ulcer.  Had Billroth 1, vagotomy, fundoplication, omentopexy August 2017 with initial improvement.  Had recurrent symptoms September 2017 that become progressively worse with gastric dilation from recurrent stricture.  Had laparoscopic placement of jejunostomy tube, gastrostomy tube, and EGD balloon dilation of duodenal stricture 10/17.  Developed gastric leakage with peritonitis after pulling out PEG tube 10/19 and taken to OR. On 10/27 found 2 new LUQ abscesses/fluid collections which were drained percutaneously by IR. Had perc trach 10/27.  As of 10/30 his mental status has improved, he has remained off of vasopressors for several days and he has done well with weaning off the ventilator per MD note.  Subjective: pt awake in bed Assessment / Plan / Recommendation CHL IP CLINICAL IMPRESSIONS 12/17/2015 Therapy Diagnosis Moderate oral phase dysphagia;Moderate pharyngeal phase dysphagia;Moderate cervical esophageal phase dysphagia Clinical Impression Patient presents with moderate oropharyngeal dysphagia characterized by significant weakness resulting in residuals and aspiration.  Pt  was only given minimal amount of thin and nectar barium during testing.  Of note, after patient was transferred to flouro chair, he demonstrated copious coughing.  He cleared scant secretions through trach tube at that time.  Transferring to chair and coughing episode significantly fatigued patient.  PMSV was placed for MBS with good tolerance during testing.  Unfortunately patient is grossly weak resulting in oropharyngeal deficits and secretion retention that mixed with barium.  Delayed oral transiting, lingual pumping and premature spillage of barium into pharynx noted due to weakness.  Compromised  tongue base retraction and laryngeal elevation allowed laryngeal penetration and SILENT aspiration both during and after the swallow.  Cues to cough/throat clear and dry swallow did not fully clear penetrates and patient quickly re-penetrated pharyngeal residuals.  In addition patient appeared with decreased proximal esophageal opening/decreased CP opening resulting in residuals in pyriform sinus.  Unfortunately patient is at high risk of aspiration with intake at this time.  Using teach back/live video educated patient to findings and reinforced effective compensation strategies.   Recommend continue NPO except single ice chips with strict precautions - oral care, pmsv in place and cueing patient to cough/clear throat and re-swallow.  Pt will need repeat MBS in future due to silent nature of dysphagia. Will follow up for dysphagia treatment/management.   Thanks for allowing me to help care for this patient.  Impact on safety and function Severe aspiration risk;Risk for inadequate nutrition/hydration   CHL IP TREATMENT RECOMMENDATION 12/17/2015 Treatment Recommendations F/U MBS in --- days (Comment)   Prognosis 12/17/2015 Prognosis for Safe Diet Advancement Fair Barriers to Reach Goals Severity of deficits;Cognitive deficits Barriers/Prognosis Comment -- CHL IP DIET RECOMMENDATION 12/17/2015 SLP Diet Recommendations NPO;Ice chips PRN after oral care Liquid Administration via -- Medication Administration Via alternative means Compensations Minimize environmental distractions;Slow rate;Multiple dry swallows after each bite/sip;Hard cough after swallow Postural Changes --   CHL IP OTHER RECOMMENDATIONS 12/17/2015 Recommended Consults -- Oral Care Recommendations Oral care QID Other Recommendations --   CHL IP FOLLOW UP RECOMMENDATIONS 12/17/2015 Follow up Recommendations Skilled Nursing facility;Inpatient Rehab   CHL IP FREQUENCY AND DURATION 12/17/2015 Speech Therapy Frequency (ACUTE ONLY) min 2x/week Treatment Duration 2  weeks      CHL IP ORAL PHASE 12/17/2015 Oral Phase Impaired Oral - Pudding Teaspoon -- Oral - Pudding Cup -- Oral - Honey Teaspoon -- Oral - Honey Cup -- Oral - Nectar Teaspoon -- Oral - Nectar Cup Weak lingual manipulation;Reduced posterior propulsion;Delayed oral transit;Decreased bolus cohesion;Lingual pumping Oral - Nectar Straw -- Oral - Thin Teaspoon Weak lingual manipulation;Delayed oral transit;Decreased bolus cohesion;Reduced posterior propulsion;Lingual pumping Oral - Thin Cup Weak lingual manipulation;Reduced posterior propulsion;Delayed oral transit;Decreased bolus cohesion;Lingual pumping Oral - Thin Straw Weak lingual manipulation;Decreased bolus cohesion;Delayed oral transit;Reduced posterior propulsion;Lingual pumping Oral - Puree -- Oral - Mech Soft -- Oral - Regular -- Oral - Multi-Consistency -- Oral - Pill -- Oral Phase - Comment --  CHL IP PHARYNGEAL PHASE 12/17/2015 Pharyngeal Phase Impaired Pharyngeal- Pudding Teaspoon -- Pharyngeal -- Pharyngeal- Pudding Cup -- Pharyngeal -- Pharyngeal- Honey Teaspoon -- Pharyngeal -- Pharyngeal- Honey Cup -- Pharyngeal -- Pharyngeal- Nectar Teaspoon Reduced laryngeal elevation;Pharyngeal residue - pyriform;Pharyngeal residue - valleculae;Reduced tongue base retraction;Reduced airway/laryngeal closure;Reduced epiglottic inversion;Penetration/Aspiration during swallow Pharyngeal Material enters airway, passes BELOW cords without attempt by patient to eject out (silent aspiration) Pharyngeal- Nectar Cup Reduced laryngeal elevation;Reduced tongue base retraction;Reduced anterior laryngeal mobility;Reduced epiglottic inversion;Reduced airway/laryngeal closure;Penetration/Aspiration during swallow Pharyngeal Material enters airway, passes BELOW cords without attempt by  patient to eject out (silent aspiration) Pharyngeal- Nectar Straw -- Pharyngeal -- Pharyngeal- Thin Teaspoon Reduced laryngeal elevation;Reduced airway/laryngeal closure;Reduced tongue base  retraction;Penetration/Aspiration during swallow;Reduced epiglottic inversion;Trace aspiration;Pharyngeal residue - valleculae;Pharyngeal residue - pyriform;Pharyngeal residue - cp segment Pharyngeal Material enters airway, passes BELOW cords without attempt by patient to eject out (silent aspiration) Pharyngeal- Thin Cup Moderate aspiration;Penetration/Aspiration during swallow;Penetration/Apiration after swallow;Reduced pharyngeal peristalsis;Reduced epiglottic inversion;Reduced laryngeal elevation;Reduced airway/laryngeal closure;Reduced tongue base retraction Pharyngeal Material enters airway, passes BELOW cords without attempt by patient to eject out (silent aspiration) Pharyngeal- Thin Straw Reduced pharyngeal peristalsis;Reduced epiglottic inversion;Reduced anterior laryngeal mobility;Reduced laryngeal elevation;Reduced airway/laryngeal closure;Pharyngeal residue - valleculae;Pharyngeal residue - pyriform;Moderate aspiration;Penetration/Aspiration during swallow Pharyngeal Material enters airway, passes BELOW cords without attempt by patient to eject out (silent aspiration) Pharyngeal- Puree -- Pharyngeal -- Pharyngeal- Mechanical Soft -- Pharyngeal -- Pharyngeal- Regular -- Pharyngeal -- Pharyngeal- Multi-consistency -- Pharyngeal -- Pharyngeal- Pill -- Pharyngeal -- Pharyngeal Comment pt without awareness to residuals, penetration and aspiration did not fully clear despite cued cough, pt also with aspiration of secretions mixed with barium  CHL IP CERVICAL ESOPHAGEAL PHASE 12/17/2015 Cervical Esophageal Phase Impaired Pudding Teaspoon -- Pudding Cup -- Honey Teaspoon -- Honey Cup -- Nectar Teaspoon Reduced cricopharyngeal relaxation;Prominent cricopharyngeal segment Nectar Cup Reduced cricopharyngeal relaxation;Prominent cricopharyngeal segment Nectar Straw -- Thin Teaspoon Reduced cricopharyngeal relaxation;Prominent cricopharyngeal segment Thin Cup Reduced cricopharyngeal relaxation;Prominent  cricopharyngeal segment Thin Straw Reduced cricopharyngeal relaxation;Prominent cricopharyngeal segment Puree -- Mechanical Soft -- Regular -- Multi-consistency -- Pill -- Cervical Esophageal Comment pt appeared with tight UES  Donavan Burnet, MS [facility] SLP [phone]             PCXR w/ minimal L base atx. Aeration improved. Trach good position.   Studies: 10/11 CT abdomen/pelvis> significant distension of the stomach with layering fluid and gas, NG present, worrisome for recurrent gastric outlet obstruction.  RLL consolidation, cholecycstectomy 10/26 CT abdomen/pelvis> Moderate dilatation of proximal and mid small bowel loops, with transition point in anterior right lower quadrant, suspicious for partial small bowel obstruction. Pneumatosis involving multiple dilated small bowel loops in the left abdomen pelvis, suspicious for bowel ischemia. No evidence of portal venous gas or free intraperitoneal air. Two left upper quadrant fluid collections adjacent to the stomach and spleen, which may represent postoperative fluid collections or abscesses. Small amount free fluid also noted in pelvis.Small bilateral pleural effusions and bibasilar atelectasis. Diffuse mesenteric and body wall edema. 11/2 Modified barium swallow> silent aspiration  Cultures: Blood 10/19 >> neg Fluid cx 10/17 >> neg  Antibiotics: Zosyn 10/19 >> off, when?  Anidulafungin 10/19 >> 10/26   Events: 10/10  admission 10/17  Lap placement of J & G tubes, EGD w/ balloon dilation of duodenal stricture, gastric biopsies, closure of gastrotomy 10/18  sundowning, pulled out G tube 10/19  laparaoscopy (findings gastric leak, bilious ascites, jejunal disruption), omentoplexy of jejunal disruption, gastric leak 10/24 extubated. Required re-intubation.  10/25: placed on precedex for acute encephalopathy; new drainage from J tube. tubefeeds stopped.   10/26 Heparin off since this am (as recommended by vascular). Still on pressors. Getting  albumin and lasix to a/w mobilizing third spaced fluid/anasarca. Developed rectal bleeding. This has initiated after starting bowel prep. Hgb has drifted from 8 to 7.7 --> stopped Santel heparin. CT found evidence of bowel pneumotosis worrisome for ischemia, and two LUQ fluid collections 10/27: trach placed. TO IR for Perc drains to be placed in LUQ 10/30 EGD : mild esophagitis, evidence of billroth procedure, gastric pouch, non-bleeding duodenal ulcers,  non-bleeding jejunal ulcers, could not reach j - tube 10/31: PMSV trial, pt tolerated well 11/1: >24 hours on trach collar; PSMV in place   Lines/tube: 10/19 ETT >> 10/24 then replaced 10/24 > 10/27 10/19 CVL left subclavian >>  10/19 R brachial arterial line >> 10/20 10/19 abdominal drain x3 >> 10/19 G tube >> 10/19 J tube >> 10/27 Trach placed (feinstein # 6 cuffed), Abd drains by IR 10/29 J dislodged, replaced by IR 11/3 early AM pulled out J tube, replaced by surgery  Summary: 75 yo male former smoker with progressive epigastric pain, nausea, vomiting, bloating from gastric outlet obstruction with pyloric channel ulcer.  Had Billroth 1, vagotomy, fundoplication, omentopexy August 2017 with initial improvement.  Had recurrent symptoms September 2017 that become progressively worse with gastric dilation from recurrent stricture.  Had laparoscopic placement of jejunostomy tube, gastrostomy tube, and EGD balloon dilation of duodenal stricture 10/17.  Developed gastric leakage with peritonitis after pulling out PEG tube 10/19 and taken to OR. On 10/27 found 2 new LUQ abscesses/fluid collections which were drained percutaneously by IR. Had perc trach 10/27.     PMHx of CAD s/p stent, GERD, HTN, HLD  Assessment/plan:  Acute and chronic respiratory failure  Tracheostomy status.  - ABG 11/3 well compensated after 72+ h on ATC - tracheostomy collar O2 supplementation to maintain O2 > 90% - PMSV in place when supervised, tolerating well - Cont  routine trach care  - likely change to cuffless trach beginning of the week - would consider decannulation only when he is up walking around, taking by mouth  Recurrent gastric outlet obstruction GI bleeding> source inflamed small bowel? > resolved Abdominal abscess/fluid collections s/p IR drains Protein-calorie malnutrition Dislodged J overnight 11/3 > replaced - G and J tube management per surgery - nutrition per GI surgery - TPN per surgery - monitoring off of antibiotics - Follow fluid cx from IR drainage - SLP eval  Hypertension with Sinus tachycardia - low dose metoprolol  AKI >> resolved Hypernatremia  Hyperchloremia  - monitor renal function, urine outpt - d/c'd D5W given hyperglycemia - add free water 11/4 - f/u am chemistry  - replace electrolytes as needed  Acute metabolic encephalopathy, improved 11/4 Post-op pain Severe Physical deconditioning  - PRN fent for pain - haldol prn for severe agitation  - continue scheduled haldol, monitor QTc on tele monitor  Rt index finger ischemia likely from Rt brachial a line. > improved, no clot on upper ext doppler ultrasound - VVS consulted 10/20, appreciate input-->resolved  - no more Arterial lines right arm  Hyperglycemia - SSI  Anasarca - cont nutritional support    SUP - Famotidine IV DVT prophylaxis - SCDs Goals of care - Full code   Levy Pupa, MD, PhD 12/19/2015, 8:57 AM [facility] [phone] or if no answer [phone]

## 2015-12-19 NOTE — Progress Notes (Signed)
[name] [place], [place] [address] Phone: [phone] FAX: [phone]   TREYSEN SUDBECK [id number] [birth year]  CARE TEAM:  PCP: Irven Shelling, MD  Outpatient Care Team: Patient Care Team: Lavone Orn, MD as PCP - General (Internal Medicine) Michael Boston, MD as Consulting Physician (General Surgery) Arta Silence, MD as Consulting Physician (Gastroenterology)  Inpatient Treatment Team: Treatment Team: Attending Provider: Michael Boston, MD; Consulting Physician: Arta Silence, MD; Technician: Sueanne Margarita, NT; Consulting Physician: Md Pccm, MD; Rounding Team: Md Pccm, MD; Chaplain: Hadley Pen, Chaplain; Registered Nurse: Kennyth Lose, RN; Registered Nurse: Carl Best, RN; Speech Language Pathologist: Narda Rutherford, [place]  SURGERY 12/03/2015  POST-OPERATIVE DIAGNOSIS:    Peritonitis Jejunal disruption Gastric leak  PROCEDURE:    LAPAROSCOPY DIAGNOSTIC OMENTOPEXY of jejunal disruption [place] OUT x 21L with drains placement  SURGEON:  Michael Boston, MD   Post-Op 12/01/2015  POST-OPERATIVE DIAGNOSIS:    RECURRENT GASTRIC OUTLET OBSTRUCTION DUE to significant edema at gastroduodenal anastomosis. GASTRITIS Failure to thrive with malnutrition  PROCEDURE:   LAPAROSCOPIC PLACEMENT OF FEEDING JEJUNOSTOMY AND GASTROSTOMY TUBEs ESOPHAGOGASTRODUODENOSCOPY (EGD) BALLOON DILATION OF DUODENAL STRICTURE Gastric biopsies  Closure of gastrotomy  SURGEON:  Michael Boston, MD  Prior surgery 09/25/2015  POST-OPERATIVE DIAGNOSIS:  Partial gastric outlet obstruction due to chronic pyloric stricture  PROCEDURE:   XI ROBOTIC distal gastrectomy BILROTH I  ANASTOMOSIS  ANTERIOR AND POSTERIOR VAGOTOMY DOR (Anterior 201 degree) FUNDIPLICATION OMENTOPEXY UPPER ENDOSCOPY  SURGEON:  Michael Boston, MD   Problem List:   Principal Problem:   Bile peritonitis due to gastric tube  dislodgement Active Problems:   Essential hypertension, benign   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 09/25/2015   Hyperlipidemia   Anxiety state   Gastric outlet obstruction   Gastric Ileus    Encephalopathy acute   Endotracheally intubated   Protein-calorie malnutrition, severe   Acute respiratory failure with hypoxemia (HCC)   Tobacco abuse   Ischemic right index finger at tip   Gastritis   Gastric tube present (LUQ)   Jejunostomy tube present (LLQ)   Pneumatosis of intestines    Assessment/Plan:  GUARDED but recovering   He pulled out J tube at ~0500.  I replaced with 16Fr red robinson.  Balloon filled to 55m.  Stiched 0 silk x 2.  Immediate succus back - placed to gravity.  New abd binder placed to cover tubes.  Tube study Xray shows good position   Cont G tube to suction over weekend 2/2 inc output  J tube to gravity over weekend 2/2 inc output  Retry trophic TF in a few days.  ICU wants to start free water in G tube.  I see no contraindication to that but doubt it will do much good if J tube is draining.    Follow off ABx.  CT scan PRN if worsens.  Cultures negative = No more peritoneal drains.  Hopefully oiutside window of developing new intraperitoneal abscess  Sedation/Anxiolysis with confusion - challenge with pt that goes from sleeping to pulling at lines/tubes.  More calm a hopeful sign with Haldol, but guarded w pulling out another tube last weekend & last night.  Mobilize & get more active.  Sundowning precautions.    Trach collar >36hr hopeful sign.  Diuresis  Speech therapy reevaluate to rule out aspiration and see if he can try doing some drinking up to a pureed diet with the G-tube in place.  Follow & replace electrolytes (esp K) as needed  IV nutrition TNA.  Wean when tolerates TFs at goal  Able to pass endoscopy tube across the gastroduodenal Bilroth anastomosis signs the gastric outlet obstruction from inflammation is  improving.  Inflammatory narrowing at gastroduodenal Bilroth I anastomosis.  The fact that there is bilious reflux into the stomach argues against a complete obstruction.  Skin care.   F/u Bx's for Hpylori neg.  Carafate & PPI via Gtube as tolerated.    Pneumatosis & jejunal bowel wall thickening.  Lactate <2.   No SMA thrombosis.  No evidence of any embolic disease in the mesentery.  Off pressors.  WBC stable Suspect this is source of GL bleeding (NGT & Gtube bilious, not bloody.  EGD without bleeding in stomach/duodenum.   Jejunostomy tube replaced.  No perforation.  No extravasation of contrast around jejunostomy tube.   BMs no longer bloody.  Hold lovenox if Hgb <8.  If becomes complete SBO, may need SBR - try to hold off  I would not operate on this patient unless he goes under septic shock with high lactate, severe distention and peritonitis.  Extremely hostile abdomen with high risk of enterocutaneous fistula and breakdown of any small bowel resection.  Any bowel obstruction would be related to inflammation and adhesions and an early inflammatory stage.  Would manage conservatively anyway.  Having BMs = not complete SBO.  Continue proximal decompression with orogastric active LIWS suction, gastric tube to gravity (to keep pressure down but not actively suck around Gtube skin leak), jejunostomy tube back to gravity.  VTE prophylaxis - SCDs.  Hold off on enoxaparin if Hgb<8 s or persistent bleeding.   Seems like the GI bleeding has resolved.  Brachial artery embolus to right index finger resolved.   D/w Dr. Trula Slade with vascular surgery.   Duplex study argues against any more brachial clot with good blood supply.  Finger tip improved  Nicotene patch   Disposition: He will definitetly require skilled care facility with rehabilitation capacity upon discharge from the hospital.  I suspect it's going to take at least a few weeks before he'll be ready for that transition.     Subjective:  Having a  lot of airway secreations  Friend in room  ICU RN in room   Objective:  Vital signs:  Vitals:   12/19/15 0400 12/19/15 0500 12/19/15 0600 12/19/15 0800  BP: (!) 161/39  (!) 143/50 133/62  Pulse:      Resp: 20 (!) 22 (!) 24 (!) 22  Temp: 99 F (37.2 C)   98.2 F (36.8 C)  TempSrc: Axillary   Oral  SpO2: 97% 99% 96% 99%  Weight:      Height:        Last BM Date: 12/17/15  Intake/Output   Yesterday:  11/03 0701 - 11/04 0700 In: 1806 [I.V.:1674; NG/GT:32; IV Piggyback:100] Out: 2450 [Urine:1550; Drains:900] This shift:  Total I/O In: 10 [I.V.:10] Out: -   Bowel function:  Flatus: YES  BM:  No -last 3 days ago  Gtube: thin light bile - no blood.  Leaking around skin not more inflamed Jtube site: scant bilious drainage stopped w upsized 16Fr 11/4  Physical Exam:  General: Awake, sitting in chair.  Following commands    Abdomen:  G tube LUQ. Stitched intact x 2.  J tube in LLQ draining bilious fluid  Edema left flank.   Softer.  Nondistended.  Tenderness at LLQ mild no guarding only.  No pain  elsewhere.   GU:  Foley in place.  Ext:  SCDs BLE.  No mjr edema.  Right index finger: Good cap refill to nail bed.  PICC LUE clean Skin: 1+ anasarca.  No other petechiae / purpura  Results:   Diagnosis Stomach, biopsy - CHRONIC FOCALLY ACTIVE GASTRITIS. - THERE IS NO EVIDENCE OF HELICOBACTER PYLORI, DYSPLASIA OR MALIGNANCY. - SEE COMMENT. Microscopic Comment A Warthin-Starry stain is negative for the presence of Helicobacter pylori organisms. (JBK:gt, 12/04/15) Enid Cutter MD Pathologist, Electronic Signature (Case signed 12/04/2015) Specimen Gross and Clinical Information Specimen(s) Obtained: Stomach, biopsy Specimen Clinical  Labs: Results for orders placed or performed during the hospital encounter of 11/24/15 (from the past 48 hour(s))  Glucose, capillary     Status: Abnormal   Collection Time: 12/17/15 12:51 PM  Result Value Ref Range   Glucose-Capillary  126 (H) 65 - 99 mg/dL  Potassium     Status: None   Collection Time: 12/17/15  3:30 PM  Result Value Ref Range   Potassium 3.7 3.5 - 5.1 mmol/L  Glucose, capillary     Status: Abnormal   Collection Time: 12/17/15  3:49 PM  Result Value Ref Range   Glucose-Capillary 121 (H) 65 - 99 mg/dL  Glucose, capillary     Status: Abnormal   Collection Time: 12/17/15  7:36 PM  Result Value Ref Range   Glucose-Capillary 117 (H) 65 - 99 mg/dL  Glucose, capillary     Status: Abnormal   Collection Time: 12/17/15 11:58 PM  Result Value Ref Range   Glucose-Capillary 138 (H) 65 - 99 mg/dL  Glucose, capillary     Status: Abnormal   Collection Time: 12/18/15  4:07 AM  Result Value Ref Range   Glucose-Capillary 128 (H) 65 - 99 mg/dL  Basic metabolic panel     Status: Abnormal   Collection Time: 12/18/15  5:14 AM  Result Value Ref Range   Sodium 147 (H) 135 - 145 mmol/L   Potassium 3.8 3.5 - 5.1 mmol/L   Chloride 119 (H) 101 - 111 mmol/L   CO2 25 22 - 32 mmol/L   Glucose, Bld 137 (H) 65 - 99 mg/dL   BUN 46 (H) 6 - 20 mg/dL   Creatinine, Ser 1.24 0.61 - 1.24 mg/dL   Calcium 8.2 (L) 8.9 - 10.3 mg/dL   GFR calc non Af Amer 55 (L) >60 mL/min   GFR calc Af Amer >60 >60 mL/min    Comment: (NOTE) The eGFR has been calculated using the CKD EPI equation. This calculation has not been validated in all clinical situations. eGFR's persistently <60 mL/min signify possible Chronic Kidney Disease.    Anion gap 3 (L) 5 - 15  Glucose, capillary     Status: Abnormal   Collection Time: 12/18/15  8:07 AM  Result Value Ref Range   Glucose-Capillary 129 (H) 65 - 99 mg/dL   Comment 1 Notify RN    Comment 2 Document in Chart   Blood gas, arterial     Status: Abnormal   Collection Time: 12/18/15 10:30 AM  Result Value Ref Range   FIO2 28.00    Delivery systems TRACH COLLAR/TRACH TUBE    pH, Arterial 7.465 (H) 7.350 - 7.450   pCO2 arterial 32.8 32.0 - 48.0 mmHg   pO2, Arterial 76.2 (L) 83.0 - 108.0 mmHg    Bicarbonate 23.3 20.0 - 28.0 mmol/L   Acid-Base Excess 0.3 0.0 - 2.0 mmol/L   O2 Saturation 95.1 %   Patient temperature 98.6  Collection site LEFT RADIAL    Drawn by MD    Sample type ARTERIAL DRAW    Allens test (pass/fail) PASS PASS  CBC     Status: Abnormal   Collection Time: 12/18/15 11:59 AM  Result Value Ref Range   WBC 10.6 (H) 4.0 - 10.5 K/uL   RBC 2.97 (L) 4.22 - 5.81 MIL/uL   Hemoglobin 8.6 (L) 13.0 - 17.0 g/dL   HCT 26.0 (L) 39.0 - 52.0 %   MCV 87.5 78.0 - 100.0 fL   MCH 29.0 26.0 - 34.0 pg   MCHC 33.1 30.0 - 36.0 g/dL   RDW 15.4 11.5 - 15.5 %   Platelets 573 (H) 150 - 400 K/uL  Glucose, capillary     Status: Abnormal   Collection Time: 12/18/15 12:08 PM  Result Value Ref Range   Glucose-Capillary 118 (H) 65 - 99 mg/dL   Comment 1 Notify RN    Comment 2 Document in Chart   Glucose, capillary     Status: Abnormal   Collection Time: 12/18/15  4:53 PM  Result Value Ref Range   Glucose-Capillary 131 (H) 65 - 99 mg/dL   Comment 1 Notify RN    Comment 2 Document in Chart   Glucose, capillary     Status: None   Collection Time: 12/18/15  8:17 PM  Result Value Ref Range   Glucose-Capillary 88 65 - 99 mg/dL  Glucose, capillary     Status: Abnormal   Collection Time: 12/19/15 12:09 AM  Result Value Ref Range   Glucose-Capillary 148 (H) 65 - 99 mg/dL  Glucose, capillary     Status: Abnormal   Collection Time: 12/19/15  7:51 AM  Result Value Ref Range   Glucose-Capillary 138 (H) 65 - 99 mg/dL   Comment 1 Notify RN    Comment 2 Document in Chart     Imaging / Studies: Dg Abd 1 View  Result Date: 12/18/2015 CLINICAL DATA:  Jejunostomy tube replacement. EXAM: ABDOMEN - 1 VIEW COMPARISON:  Procedural spot fluoroscopic 12/13/2015. CT abdomen and pelvis 12/10/2015. FINDINGS: Percutaneous jejunostomy tube overlies the left mid abdomen. Injected contrast opacifies an adjacent small bowel loop. A small amount of contrast is partially visualized in the left upper quadrant  in the expected region of the GE junction and may be within the distal esophagus and stomach. Abdominal surgical clips are present. Gas is present in grossly nondilated loops of small and large bowel to the level of the rectum without evidence of obstruction. IMPRESSION: Satisfactory positioning of percutaneous jejunostomy tube based on appearance of injected contrast material. Electronically Signed   By: Logan Bores M.D.   On: 12/18/2015 10:13   Dg Swallowing Func-speech Pathology  Result Date: 12/17/2015 Objective Swallowing Evaluation: Type of Study: MBS-Modified Barium Swallow Study Patient Details Name: [name] MRN: [id number] Date of Birth: Feb 28, 1940 Today's Date: 12/17/2015 Time: SLP Start Time (ACUTE ONLY): 0925-SLP Stop Time (ACUTE ONLY): 0945 SLP Time Calculation (min) (ACUTE ONLY): 20 min Past Medical History: Past Medical History: Diagnosis Date . Bilateral dry eyes  . Coronary artery disease 1997  bare mental stent right coronart artery . Degenerative disc disease   LOWER BACK . Dysphonia  . ED (erectile dysfunction)  . Gastroesophageal reflux disease  . Hyperlipidemia  . Hypertension  . Peptic ulcer  . Pneumonia   HISTORY OF IN CHILDHOOD . Posterior vitreous detachment 10/30/2012 . Urinary hesitancy  . Wears glasses  Past Surgical History: Past Surgical History: Procedure Laterality Date .  CARDIAC CATHETERIZATION   . CHOLECYSTECTOMY   . ESOPHAGOGASTRODUODENOSCOPY N/A 12/01/2015  Procedure: ESOPHAGOGASTRODUODENOSCOPY (EGD) BALLOON DILATION OF DUODENAL STRICTURE;  Surgeon: Michael Boston, MD;  Location: [facility];  Service: General;  Laterality: N/A; . ESOPHAGOGASTRODUODENOSCOPY N/A 12/14/2015  Procedure: ESOPHAGOGASTRODUODENOSCOPY (EGD);  Surgeon: Clarene Essex, MD;  Location: [facility];  Service: Endoscopy;  Laterality: N/A;  Patient has tracheostomy . ESOPHAGOGASTRODUODENOSCOPY (EGD) WITH PROPOFOL N/A 07/01/2015  Procedure: ESOPHAGOGASTRODUODENOSCOPY (EGD) WITH PROPOFOL;  Surgeon: Arta Silence,  MD;  Location: [facility];  Service: Endoscopy;  Laterality: N/A; . ESOPHAGOGASTRODUODENOSCOPY (EGD) WITH PROPOFOL N/A 11/23/2015  Procedure: ESOPHAGOGASTRODUODENOSCOPY (EGD) WITH PROPOFOL;  Surgeon: Arta Silence, MD;  Location[facility];  Service: Endoscopy;  Laterality: N/A;  may need to intubate . EYE SURGERY    growth on right eye removed  . IR GENERIC HISTORICAL  12/13/2015  IR REPLC DUODEN/JEJUNO TUBE PERCUT W/FLUORO 12/13/2015 Marybelle Killings, MD [facility]-INTER[facility] RAD . LAPAROSCOPIC GASTROSTOMY N/A 12/01/2015  Procedure: LAPAROSCOPIC PLACEMENT OF FEEDING JEJUNOSTOMY AND GASTROSTOMY TUBE;  Surgeon: Michael Boston, MD;  Location: [facility];  Service: General;  Laterality: N/A; . LAPAROSCOPY N/A 12/03/2015  Procedure: LAPAROSCOPY DIAGNOSTIC, OMENTOPEXY, JEJUNOSTOMY, [place] OUT;  Surgeon: Michael Boston, MD;  Location: [facility];  Service: General;  Laterality: N/A; . STENT TO HEART    2 1997 AND 1 IN 1996 . TONSILLECTOMY   . XI ROBOTIC VAGOTOMY AND ANTRECTOMY N/A 09/25/2015  Procedure: XI ROBOTIC ANTERIOR AND POSTERIOR VAGOTOMY, BILROTH I  ANASTOMOSIS DOR FUNDIPLICATION, OMENTOPEXY  UPPER ENDOSCOPY;  Surgeon: Michael Boston, MD;  Location: [facility];  Service: General;  Laterality: N/A; HPI: 75 yo male former smoker with progressive epigastric pain, nausea, vomiting, bloating from gastric outlet obstruction with pyloric channel ulcer.  Had Billroth 1, vagotomy, fundoplication, omentopexy August 2017 with initial improvement.  Had recurrent symptoms September 2017 that become progressively worse with gastric dilation from recurrent stricture.  Had laparoscopic placement of jejunostomy tube, gastrostomy tube, and EGD balloon dilation of duodenal stricture 10/17.  Developed gastric leakage with peritonitis after pulling out PEG tube 10/19 and taken to OR. On 10/27 found 2 new LUQ abscesses/fluid collections which were drained percutaneously by IR. Had perc trach 10/27.  As of 10/30 his mental status has improved, he has remained off of  vasopressors for several days and he has done well with weaning off the ventilator per MD note.  Subjective: pt awake in bed Assessment / Plan / Recommendation CHL IP CLINICAL IMPRESSIONS 12/17/2015 Therapy Diagnosis Moderate oral phase dysphagia;Moderate pharyngeal phase dysphagia;Moderate cervical esophageal phase dysphagia Clinical Impression Patient presents with moderate oropharyngeal dysphagia characterized by significant weakness resulting in residuals and aspiration.  Pt was only given minimal amount of thin and nectar barium during testing.  Of note, after patient was transferred to flouro chair, he demonstrated copious coughing.  He cleared scant secretions through trach tube at that time.  Transferring to chair and coughing episode significantly fatigued patient.  PMSV was placed for MBS with good tolerance during testing.  Unfortunately patient is grossly weak resulting in oropharyngeal deficits and secretion retention that mixed with barium.  Delayed oral transiting, lingual pumping and premature spillage of barium into pharynx noted due to weakness.  Compromised tongue base retraction and laryngeal elevation allowed laryngeal penetration and SILENT aspiration both during and after the swallow.  Cues to cough/throat clear and dry swallow did not fully clear penetrates and patient quickly re-penetrated pharyngeal residuals.  In addition patient appeared with decreased proximal esophageal opening/decreased CP opening resulting in residuals in pyriform sinus.  Unfortunately patient is at high risk of aspiration with intake at this time.  Using teach back/live video educated patient to findings and reinforced effective compensation strategies.   Recommend continue NPO except single ice chips with strict precautions - oral care, pmsv in place and cueing patient to cough/clear throat and re-swallow.  Pt will need repeat MBS in future due to silent nature of dysphagia. Will follow up for dysphagia  treatment/management.   Thanks for allowing me to help care for this patient.  Impact on safety and function Severe aspiration risk;Risk for inadequate nutrition/hydration   CHL IP TREATMENT RECOMMENDATION 12/17/2015 Treatment Recommendations F/U MBS in --- days (Comment)   Prognosis 12/17/2015 Prognosis for Safe Diet Advancement Fair Barriers to Reach Goals Severity of deficits;Cognitive deficits Barriers/Prognosis Comment -- CHL IP DIET RECOMMENDATION 12/17/2015 SLP Diet Recommendations NPO;Ice chips PRN after oral care Liquid Administration via -- Medication Administration Via alternative means Compensations Minimize environmental distractions;Slow rate;Multiple dry swallows after each bite/sip;Hard cough after swallow Postural Changes --   CHL IP OTHER RECOMMENDATIONS 12/17/2015 Recommended Consults -- Oral Care Recommendations Oral care QID Other Recommendations --   CHL IP FOLLOW UP RECOMMENDATIONS 12/17/2015 Follow up Recommendations Skilled Nursing facility;Inpatient Rehab   CHL IP FREQUENCY AND DURATION 12/17/2015 Speech Therapy Frequency (ACUTE ONLY) min 2x/week Treatment Duration 2 weeks      CHL IP ORAL PHASE 12/17/2015 Oral Phase Impaired Oral - Pudding Teaspoon -- Oral - Pudding Cup -- Oral - Honey Teaspoon -- Oral - Honey Cup -- Oral - Nectar Teaspoon -- Oral - Nectar Cup Weak lingual manipulation;Reduced posterior propulsion;Delayed oral transit;Decreased bolus cohesion;Lingual pumping Oral - Nectar Straw -- Oral - Thin Teaspoon Weak lingual manipulation;Delayed oral transit;Decreased bolus cohesion;Reduced posterior propulsion;Lingual pumping Oral - Thin Cup Weak lingual manipulation;Reduced posterior propulsion;Delayed oral transit;Decreased bolus cohesion;Lingual pumping Oral - Thin Straw Weak lingual manipulation;Decreased bolus cohesion;Delayed oral transit;Reduced posterior propulsion;Lingual pumping Oral - Puree -- Oral - Mech Soft -- Oral - Regular -- Oral - Multi-Consistency -- Oral - Pill -- Oral  Phase - Comment --  CHL IP PHARYNGEAL PHASE 12/17/2015 Pharyngeal Phase Impaired Pharyngeal- Pudding Teaspoon -- Pharyngeal -- Pharyngeal- Pudding Cup -- Pharyngeal -- Pharyngeal- Honey Teaspoon -- Pharyngeal -- Pharyngeal- Honey Cup -- Pharyngeal -- Pharyngeal- Nectar Teaspoon Reduced laryngeal elevation;Pharyngeal residue - pyriform;Pharyngeal residue - valleculae;Reduced tongue base retraction;Reduced airway/laryngeal closure;Reduced epiglottic inversion;Penetration/Aspiration during swallow Pharyngeal Material enters airway, passes BELOW cords without attempt by patient to eject out (silent aspiration) Pharyngeal- Nectar Cup Reduced laryngeal elevation;Reduced tongue base retraction;Reduced anterior laryngeal mobility;Reduced epiglottic inversion;Reduced airway/laryngeal closure;Penetration/Aspiration during swallow Pharyngeal Material enters airway, passes BELOW cords without attempt by patient to eject out (silent aspiration) Pharyngeal- Nectar Straw -- Pharyngeal -- Pharyngeal- Thin Teaspoon Reduced laryngeal elevation;Reduced airway/laryngeal closure;Reduced tongue base retraction;Penetration/Aspiration during swallow;Reduced epiglottic inversion;Trace aspiration;Pharyngeal residue - valleculae;Pharyngeal residue - pyriform;Pharyngeal residue - cp segment Pharyngeal Material enters airway, passes BELOW cords without attempt by patient to eject out (silent aspiration) Pharyngeal- Thin Cup Moderate aspiration;Penetration/Aspiration during swallow;Penetration/Apiration after swallow;Reduced pharyngeal peristalsis;Reduced epiglottic inversion;Reduced laryngeal elevation;Reduced airway/laryngeal closure;Reduced tongue base retraction Pharyngeal Material enters airway, passes BELOW cords without attempt by patient to eject out (silent aspiration) Pharyngeal- Thin Straw Reduced pharyngeal peristalsis;Reduced epiglottic inversion;Reduced anterior laryngeal mobility;Reduced laryngeal elevation;Reduced airway/laryngeal  closure;Pharyngeal residue - valleculae;Pharyngeal residue - pyriform;Moderate aspiration;Penetration/Aspiration during swallow Pharyngeal Material enters airway, passes BELOW cords without attempt by patient to eject out (silent aspiration) Pharyngeal- Puree -- Pharyngeal -- Pharyngeal- Mechanical Soft -- Pharyngeal -- Pharyngeal- Regular -- Pharyngeal -- Pharyngeal- Multi-consistency --  Pharyngeal -- Pharyngeal- Pill -- Pharyngeal -- Pharyngeal Comment pt without awareness to residuals, penetration and aspiration did not fully clear despite cued cough, pt also with aspiration of secretions mixed with barium  CHL IP CERVICAL ESOPHAGEAL PHASE 12/17/2015 Cervical Esophageal Phase Impaired Pudding Teaspoon -- Pudding Cup -- Honey Teaspoon -- Honey Cup -- Nectar Teaspoon Reduced cricopharyngeal relaxation;Prominent cricopharyngeal segment Nectar Cup Reduced cricopharyngeal relaxation;Prominent cricopharyngeal segment Nectar Straw -- Thin Teaspoon Reduced cricopharyngeal relaxation;Prominent cricopharyngeal segment Thin Cup Reduced cricopharyngeal relaxation;Prominent cricopharyngeal segment Thin Straw Reduced cricopharyngeal relaxation;Prominent cricopharyngeal segment Puree -- Mechanical Soft -- Regular -- Multi-consistency -- Pill -- Cervical Esophageal Comment pt appeared with tight UES  Luanna Salk, MS [facility] SLP [phone]               Medications / Allergies: per chart  Antibiotics: Anti-infectives    Start     Dose/Rate Route Frequency Ordered Stop   12/09/15 1200  anidulafungin (ERAXIS) 100 mg in sodium chloride 0.9 % 100 mL IVPB     100 mg over 90 Minutes Intravenous Every 24 hours 12/09/15 0918 12/10/15 1333   12/05/15 1200  anidulafungin (ERAXIS) 100 mg in sodium chloride 0.9 % 100 mL IVPB  Status:  Discontinued     100 mg over 90 Minutes Intravenous Every 24 hours 12/04/15 1045 12/09/15 0835   12/04/15 1200  anidulafungin (ERAXIS) 200 mg in sodium chloride 0.9 % 200 mL IVPB     200 mg over 180  Minutes Intravenous  Once 12/04/15 1045 12/04/15 1725   12/04/15 0600  cefoTEtan (CEFOTAN) 2 g in dextrose 5 % 50 mL IVPB     2 g 100 mL/hr over 30 Minutes Intravenous On call to O.R. 12/03/15 1821 12/03/15 2007   12/04/15 0400  piperacillin-tazobactam (ZOSYN) IVPB 3.375 g  Status:  Discontinued     3.375 g 12.5 mL/hr over 240 Minutes Intravenous Every 8 hours 12/03/15 2229 12/15/15 0751   12/02/15 0000  erythromycin ethylsuccinate (EES) 200 MG/5ML suspension 400 mg  Status:  Discontinued    Comments:  Administer via J tube (16Fr LLQ)   400 mg Oral Every 6 hours 12/01/15 2020 12/03/15 0748   11/27/15 1200  erythromycin 500 mg in sodium chloride 0.9 % 100 mL IVPB     500 mg 100 mL/hr over 60 Minutes Intravenous Every 6 hours 11/27/15 0940 11/29/15 0608   11/26/15 0830  erythromycin 250 mg in sodium chloride 0.9 % 100 mL IVPB  Status:  Discontinued     250 mg 100 mL/hr over 60 Minutes Intravenous Every 6 hours 11/26/15 0751 57/97/28 2060        Raevin Wierenga C Aliena Ghrist, MD  [facility]      12/19/2015

## 2015-12-19 NOTE — Progress Notes (Signed)
RT placed PT on new ATC set up - at 6 lpm - 28%- RN aware- Spo2 96%, HR 91, RR 19 , BBS Coarse- dim. PT suctioned times 2- resulted in moderate , thick yellow mucus.

## 2015-12-19 NOTE — Progress Notes (Signed)
Last flushes for Gtube and Jtube were at 0615 without complication. Abdominal binder in place. Will continue to monitor patient.

## 2015-12-19 NOTE — Progress Notes (Signed)
[place] NOTE   Pharmacy Consult for TPN  Patient Measurements: Height: 6' 2"  (188 cm) Weight: 180 lb 12.4 oz (82 kg) IBW/kg (Calculated) : 82.2 TPN AdjBW (KG): 97.2 Body mass index is 23.21 kg/m.  Insulin Requirements: 5 units Novolog in past 24 hours  Current Nutrition: NPO  IVF: D5W 82m/hr - stopped 11/2 by PCCM d/t higher CBGs  Central access: PICC placed 10/12, pt pulled out 10/19. 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  Pharmacy consulted to start TPN for gastric ileus and obstruction 10/12. Patient underwent G and J tube placement this admission. On 10/19, patient pulled out PICC line. He also had to go back to OR after he removed his G tube accidentally, leading to peritonitis, intubation, and septic shock. Tube feed rate slowly advanced by CCS, but today patient noted to have dark brown output around J tube site, and tube feeds held. Pharmacy asked to resume TPN.   10/30: EGD - esophagitis, edema/erythematous stomach, stenosed Billroth I gastroduodenostomy,  Gastric pouch w/ ulceration, gastroduodenal anastomosis with ulceration, non-bleeding ulcers found in duodenum and jejunum.  10/31 and 11/1: peritoneal drains removed by IR  Today, 12/19/15: (last labs 11/3)  Glucose - No hx DM noted. CBGs at goal <150.  4 units novolog SSI/24h  Electrolytes - K 3.8 imrpoved after 2 runs KCl yest), Na and Cl- high (stable) Clinimix has 35 meq/L of Na+ so not likely contributing much to elevated Na, all other lytes including Corrected Ca WNL  Free water 1040m q8h  Renal - AKI -resolved, Scr increased overnight but still WNL,  I/O: -6442m4 (drains 900m62mt)  LFTs - AST/ALT WNL, Alk Phos and Tbili slightly elevated  TGs -76 (10/13), 97 (10/16), 214 (10/26), 194 (10/30)  Prealbumin - 17.8 (10/11), 12.8 (10/13), 11 (10/16), 12.5 (10/18), < 5 (10/23), 6.4 (10/26), 9.2 (10/30)  Pt pulled out J tube on 11/3, feeds on hold will retry trophic TF in a few days  NUTRITIONAL GOALS                                                                                             RD recs (as of 10/27): Kcal:2099 Protein:111-138 grams (1.2-1.5 grams/kg)  Clinimix 5/15 at a goal  rate of 29m/hr would provide 108g/day protein, 2013Kcal/day.  **There is currently a nPsychologist, prison and probation servicesof Clinimix solution. No 2L bags of Clinimix E 5/20 currently available. To conserve supply, will consider goal rate of Clinimix 5/15 at 83 ml/hr + 20% fat emulsion at 167mhr, which will keep total volume at 2L rather than increasing use to 3L.   PLAN                                                                                           At 1800 today:  Continue Clinimix E 5/15 to 83 ml/hr (max rate).   Continue electrolytes in TPN despite mild hypernatremia.    20% fat emulsion at 10 ml/hr.  TPN to contain standard multivitamins and trace elements.  IVF per MD.  Continue sensitive SSI q4h.   BMet in am  TPN lab panels on Mondays & Thursdays.    F/u daily.   ElDolly RiasPh 12/19/2015, 8:58 AM Pager [phone]

## 2015-12-20 LAB — BASIC METABOLIC PANEL
Anion gap: 7 (ref 5–15)
BUN: 43 mg/dL — AB (ref 6–20)
CALCIUM: 8.3 mg/dL — AB (ref 8.9–10.3)
CO2: 25 mmol/L (ref 22–32)
CREATININE: 1.03 mg/dL (ref 0.61–1.24)
Chloride: 116 mmol/L — ABNORMAL HIGH (ref 101–111)
GFR calc non Af Amer: >60 mL/min (ref >60)
Glucose, Bld: 142 mg/dL — ABNORMAL HIGH (ref 65–99)
Potassium: 3.6 mmol/L (ref 3.5–5.1)
Sodium: 148 mmol/L — ABNORMAL HIGH (ref 135–145)

## 2015-12-20 LAB — GLUCOSE, CAPILLARY
GLUCOSE-CAPILLARY: 133 mg/dL — AB (ref 65–99)
Glucose-Capillary: 121 mg/dL — ABNORMAL HIGH (ref 65–99)
Glucose-Capillary: 142 mg/dL — ABNORMAL HIGH (ref 65–99)
Glucose-Capillary: 120 mg/dL — ABNORMAL HIGH (ref 65–99)
Glucose-Capillary: 125 mg/dL — ABNORMAL HIGH (ref 65–99)
Glucose-Capillary: 140 mg/dL — ABNORMAL HIGH (ref 65–99)

## 2015-12-20 LAB — MAGNESIUM: MAGNESIUM: 2 mg/dL (ref 1.7–2.4)

## 2015-12-20 MED ORDER — TRACE MINERALS CR-CU-MN-SE-ZN 10-1000-500-60 MCG/ML IV SOLN
INTRAVENOUS | Status: AC
  Administered 2015-12-20: 17:00:00 via INTRAVENOUS
  Filled 2015-12-20 (×2): qty 1992

## 2015-12-20 MED ORDER — FENTANYL CITRATE (PF) 100 MCG/2ML IJ SOLN
25.0000 ug | INTRAMUSCULAR | Status: DC | PRN
  Administered 2015-12-20 – 2015-12-21 (×7): 50 ug via INTRAVENOUS
  Filled 2015-12-20 (×7): qty 2

## 2015-12-20 MED ORDER — CHLORHEXIDINE GLUCONATE 0.12 % MT SOLN
OROMUCOSAL | Status: AC
  Filled 2015-12-20: qty 15

## 2015-12-20 MED ORDER — FAT EMULSION 20 % IV EMUL
250.0000 mL | INTRAVENOUS | Status: AC
  Administered 2015-12-20: 250 mL via INTRAVENOUS
  Filled 2015-12-20 (×2): qty 250

## 2015-12-20 NOTE — Progress Notes (Signed)
Patient ID: [name], male   DOB: Dec 11, 1940, 75 y.o.   MRN: [id number] [facility] Progress Note:   6 Days Post-Op  Subjective: Mental status is alert despite respiratory support and trach.  Difficult for me to understand and communicate with him.   Objective: Vital signs in last 24 hours: Temp:  [98 F (36.7 C)-98.4 F (36.9 C)] 98 F (36.7 C) (11/05 0400) Pulse Rate:  [93-107] 107 (11/05 0419) Resp:  [17-28] 17 (11/05 0600) BP: (128-185)/(35-63) 128/45 (11/05 0600) SpO2:  [96 %-100 %] 100 % (11/05 0600) FiO2 (%):  [28 %] 28 % (11/05 0419) Weight:  [78.8 kg (173 lb 11.6 oz)] 78.8 kg (173 lb 11.6 oz) (11/05 0500)  Intake/Output from previous day: 11/04 0701 - 11/05 0700 In: 2098.7 [I.V.:1878.7; IV Piggyback:100] Out: 2625 [Urine:2050; Drains:575] Intake/Output this shift: No intake/output data recorded.  Physical Exam: Work of breathing is increased but that appears to be baseline.  He is wearing padded mittens to prevent him from pulling out tubes as well as an abdominal binder is in place.   He complains of some abdominal pain after relief from Fentanyl.    Lab Results:  Results for orders placed or performed during the hospital encounter of 11/24/15 (from the past 48 hour(s))  Blood gas, arterial     Status: Abnormal   Collection Time: 12/18/15 10:30 AM  Result Value Ref Range   FIO2 28.00    Delivery systems TRACH COLLAR/TRACH TUBE    pH, Arterial 7.465 (H) 7.350 - 7.450   pCO2 arterial 32.8 32.0 - 48.0 mmHg   pO2, Arterial 76.2 (L) 83.0 - 108.0 mmHg   Bicarbonate 23.3 20.0 - 28.0 mmol/L   Acid-Base Excess 0.3 0.0 - 2.0 mmol/L   O2 Saturation 95.1 %   Patient temperature 98.6    Collection site LEFT RADIAL    Drawn by MD    Sample type ARTERIAL DRAW    Allens test (pass/fail) PASS PASS  CBC     Status: Abnormal   Collection Time: 12/18/15 11:59 AM  Result Value Ref Range   WBC 10.6 (H) 4.0 - 10.5 K/uL   RBC 2.97 (L) 4.22 - 5.81 MIL/uL   Hemoglobin 8.6 (L) 13.0 - 17.0 g/dL   HCT 26.0 (L) 39.0 - 52.0 %   MCV 87.5 78.0 - 100.0 fL   MCH 29.0 26.0 - 34.0 pg   MCHC 33.1 30.0 - 36.0 g/dL   RDW 15.4 11.5 - 15.5 %   Platelets 573 (H) 150 - 400 K/uL  Glucose, capillary     Status: Abnormal   Collection Time: 12/18/15 12:08 PM  Result Value Ref Range   Glucose-Capillary 118 (H) 65 - 99 mg/dL   Comment 1 Notify RN    Comment 2 Document in Chart   Glucose, capillary     Status: Abnormal   Collection Time: 12/18/15  4:53 PM  Result Value Ref Range   Glucose-Capillary 131 (H) 65 - 99 mg/dL   Comment 1 Notify RN    Comment 2 Document in Chart   Glucose, capillary     Status: None   Collection Time: 12/18/15  8:17 PM  Result Value Ref Range   Glucose-Capillary 88 65 - 99 mg/dL  Glucose, capillary     Status: Abnormal   Collection Time: 12/19/15 12:09 AM  Result Value Ref Range   Glucose-Capillary 148 (H) 65 - 99 mg/dL  Glucose, capillary     Status: Abnormal   Collection Time:  12/19/15  7:51 AM  Result Value Ref Range   Glucose-Capillary 138 (H) 65 - 99 mg/dL   Comment 1 Notify RN    Comment 2 Document in Chart   Glucose, capillary     Status: Abnormal   Collection Time: 12/19/15 11:49 AM  Result Value Ref Range   Glucose-Capillary 137 (H) 65 - 99 mg/dL   Comment 1 Notify RN    Comment 2 Document in Chart   Glucose, capillary     Status: Abnormal   Collection Time: 12/19/15  3:48 PM  Result Value Ref Range   Glucose-Capillary 146 (H) 65 - 99 mg/dL   Comment 1 Notify RN    Comment 2 Document in Chart   Glucose, capillary     Status: Abnormal   Collection Time: 12/19/15  8:12 PM  Result Value Ref Range   Glucose-Capillary 134 (H) 65 - 99 mg/dL  Glucose, capillary     Status: Abnormal   Collection Time: 12/19/15 11:06 PM  Result Value Ref Range   Glucose-Capillary 133 (H) 65 - 99 mg/dL  Basic metabolic panel     Status: Abnormal   Collection Time: 12/20/15  5:34 AM  Result Value Ref Range   Sodium 148 (H) 135 -  145 mmol/L   Potassium 3.6 3.5 - 5.1 mmol/L   Chloride 116 (H) 101 - 111 mmol/L   CO2 25 22 - 32 mmol/L   Glucose, Bld 142 (H) 65 - 99 mg/dL   BUN 43 (H) 6 - 20 mg/dL   Creatinine, Ser 1.03 0.61 - 1.24 mg/dL   Calcium 8.3 (L) 8.9 - 10.3 mg/dL   GFR calc non Af Amer >60 >60 mL/min   GFR calc Af Amer >60 >60 mL/min    Comment: (NOTE) The eGFR has been calculated using the CKD EPI equation. This calculation has not been validated in all clinical situations. eGFR's persistently <60 mL/min signify possible Chronic Kidney Disease.    Anion gap 7 5 - 15  Glucose, capillary     Status: Abnormal   Collection Time: 12/20/15  7:56 AM  Result Value Ref Range   Glucose-Capillary 142 (H) 65 - 99 mg/dL   Comment 1 Notify RN    Comment 2 Document in Chart     Radiology/Results: Dg Abd 1 View  Result Date: 12/18/2015 CLINICAL DATA:  Jejunostomy tube replacement. EXAM: ABDOMEN - 1 VIEW COMPARISON:  Procedural spot fluoroscopic 12/13/2015. CT abdomen and pelvis 12/10/2015. FINDINGS: Percutaneous jejunostomy tube overlies the left mid abdomen. Injected contrast opacifies an adjacent small bowel loop. A small amount of contrast is partially visualized in the left upper quadrant in the expected region of the GE junction and may be within the distal esophagus and stomach. Abdominal surgical clips are present. Gas is present in grossly nondilated loops of small and large bowel to the level of the rectum without evidence of obstruction. IMPRESSION: Satisfactory positioning of percutaneous jejunostomy tube based on appearance of injected contrast material. Electronically Signed   By: Logan Bores M.D.   On: 12/18/2015 10:13    Anti-infectives: Anti-infectives    Start     Dose/Rate Route Frequency Ordered Stop   12/09/15 1200  anidulafungin (ERAXIS) 100 mg in sodium chloride 0.9 % 100 mL IVPB     100 mg over 90 Minutes Intravenous Every 24 hours 12/09/15 0918 12/10/15 1333   12/05/15 1200  anidulafungin  (ERAXIS) 100 mg in sodium chloride 0.9 % 100 mL IVPB  Status:  Discontinued  100 mg over 90 Minutes Intravenous Every 24 hours 12/04/15 1045 12/09/15 0835   12/04/15 1200  anidulafungin (ERAXIS) 200 mg in sodium chloride 0.9 % 200 mL IVPB     200 mg over 180 Minutes Intravenous  Once 12/04/15 1045 12/04/15 1725   12/04/15 0600  cefoTEtan (CEFOTAN) 2 g in dextrose 5 % 50 mL IVPB     2 g 100 mL/hr over 30 Minutes Intravenous On call to O.R. 12/03/15 1821 12/03/15 2007   12/04/15 0400  piperacillin-tazobactam (ZOSYN) IVPB 3.375 g  Status:  Discontinued     3.375 g 12.5 mL/hr over 240 Minutes Intravenous Every 8 hours 12/03/15 2229 12/15/15 0751   12/02/15 0000  erythromycin ethylsuccinate (EES) 200 MG/5ML suspension 400 mg  Status:  Discontinued    Comments:  Administer via J tube (16Fr LLQ)   400 mg Oral Every 6 hours 12/01/15 2020 12/03/15 0748   11/27/15 1200  erythromycin 500 mg in sodium chloride 0.9 % 100 mL IVPB     500 mg 100 mL/hr over 60 Minutes Intravenous Every 6 hours 11/27/15 0940 11/29/15 0608   11/26/15 0830  erythromycin 250 mg in sodium chloride 0.9 % 100 mL IVPB  Status:  Discontinued     250 mg 100 mL/hr over 60 Minutes Intravenous Every 6 hours 11/26/15 0751 11/27/15 0940      Assessment/Plan: Problem List: Patient Active Problem List   Diagnosis Date Noted  . Pneumatosis of intestines 12/11/2015  . Tobacco abuse 12/09/2015  . Ischemic right index finger at tip 12/09/2015  . Gastritis 12/09/2015  . Gastric tube present (LUQ) 12/09/2015  . Jejunostomy tube present (LLQ) 12/09/2015  . Acute respiratory failure with hypoxemia ([place])   . Protein-calorie malnutrition, severe 12/04/2015  . Encephalopathy acute 12/03/2015  . Bile peritonitis due to gastric tube dislodgement 12/03/2015  . Endotracheally intubated   . Gastric Ileus  11/25/2015  . Gastric outlet obstruction 11/24/2015  . Anxiety state 09/28/2015  . Hyperlipidemia   . Acquired stricture of pylorus  s/p vagatomy & distal gastrectomy 09/25/2015 09/25/2015  . Coronary atherosclerosis of native coronary artery 04/24/2013  . Essential hypertension, benign 04/24/2013  . Other and unspecified hyperlipidemia 04/24/2013  . Central pterygium 10/30/2012  . 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 Cataract, nuclear sclerotic senile 10/30/2012    Appears stable;  Will see if I can increase the frequency of his Fentanyl.   6 Days Post-Op    LOS: 25 days   Matt B. Hassell Done, MD, [facility] [phone] beeper [phone]  12/20/2015 8:34 AM

## 2015-12-20 NOTE — Progress Notes (Addendum)
[place] NOTE   Pharmacy Consult for TPN  Patient Measurements: Height: _0  (188 cm) Weight: 173 lb 11.6 oz (78.8 kg) IBW/kg (Calculated) : 82.2 TPN AdjBW (KG): 97.2 Body mass index is 22.3 kg/m.  Insulin Requirements: 6 units Novolog in past 24 hours  Current Nutrition: NPO  IVF: D5W 80m/hr - stopped 11/2 by PCCM d/t higher CBGs  Central access: PICC placed 10/12, pt pulled out 10/19. CVC in place since 10/19 TPN date: 10/12-10/19, resumed 10/25  ASSESSMENT                                                                                                          HPI: [id number] admitted 10/9 with new upper abdominal discomfort and bloating with concern for recurrent GOO due to chronic pyloric stricture. 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  Pharmacy consulted to start TPN for gastric ileus and obstruction 10/12. Patient underwent G and J tube placement this admission. On 10/19, patient pulled out PICC line. He also had to go back to OR after he removed his G tube accidentally, leading to peritonitis, intubation, and septic shock. Tube feed rate slowly advanced by CCS, but today patient noted to have dark brown output around J tube site, and tube feeds held. Pharmacy asked to resume TPN.   10/30: EGD - esophagitis, edema/erythematous stomach, stenosed Billroth I gastroduodenostomy,  Gastric pouch w/ ulceration, gastroduodenal anastomosis with ulceration, non-bleeding ulcers found in duodenum and jejunum.  10/31 and 11/1: peritoneal drains removed by IR  Today, 12/20/15: (last labs 11/3)  Glucose - No hx DM noted. CBGs at goal <150.  6 units novolog SSI/24h  Electrolytes - K 3.6, Na and Cl- high (stable) Clinimix has 35 meq/L of Na+ so not likely contributing much to elevated Na, all other lytes including Corrected Ca WNL  Renal - AKI -resolved,  I/O: -526.363m24 (drains 57531mout)  LFTs - AST/ALT WNL, Alk Phos and Tbili slightly elevated  TGs -76 (10/13), 97 (10/16), 214 (10/26), 194 (10/30)  Prealbumin - 17.8 (10/11), 12.8 (10/13), 11 (10/16), 12.5 (10/18), < 5 (10/23), 6.4 (10/26), 9.2 (10/30)  Pt pulled out J tube on 11/3, feeds on hold will retry trophic TF in a few days  NUTRITIONAL GOALS                                                                                             RD recs (as of 10/27): KcaGOTL:5726otein:111-138 grams (1.2-1.5 grams/kg)  Clinimix 5/15 at a goal rate of 73m27m would provide 108g/day protein, 2013Kcal/day.  **There is currently a natiPsychologist, prison and probation servicesClinimix  solution. No 2L bags of Clinimix E 5/20 currently available. To conserve supply, will consider goal rate of Clinimix 5/15 at 83 ml/hr + 20% fat emulsion at 74m/hr, which will keep total volume at 2L rather than increasing use to 3L.   PLAN                                                                                           At 1800 today:  Continue Clinimix E 5/15 to 83 ml/hr (max rate).   Continue electrolytes in TPN despite mild hypernatremia.    20% fat emulsion at 10 ml/hr.  TPN to contain standard multivitamins and trace elements.  IVF per MD.  Continue sensitive SSI q4h.   TPN lab panels on Mondays & Thursdays.    F/u daily.   EDolly RiasRPh 12/20/2015, 8:28 AM Pager [phone]

## 2015-12-20 NOTE — Progress Notes (Signed)
RT placed PT on ATC set up at 28% (6 lpm [place]).

## 2015-12-20 NOTE — Progress Notes (Signed)
Patient had episodes of non-sustained SVT thru the night. He was asymptomatic, denied chest pain and shortness of breath.  Dr. Delton CoombesByrum made aware of this, verbal order to add magnesium level to already drawn morning lab was received. Lab notified of this new order.

## 2015-12-21 DIAGNOSIS — Z930 Tracheostomy status: Secondary | ICD-10-CM

## 2015-12-21 LAB — CBC
HCT: 25.9 % — ABNORMAL LOW (ref 39.0–52.0)
Hemoglobin: 8.4 g/dL — ABNORMAL LOW (ref 13.0–17.0)
MCH: 28.9 pg (ref 26.0–34.0)
MCHC: 32.4 g/dL (ref 30.0–36.0)
MCV: 89 fL (ref 78.0–100.0)
PLATELETS: 473 10*3/uL — AB (ref 150–400)
RBC: 2.91 MIL/uL — ABNORMAL LOW (ref 4.22–5.81)
RDW: 15.2 % (ref 11.5–15.5)
WBC: 8.9 10*3/uL (ref 4.0–10.5)

## 2015-12-21 LAB — GLUCOSE, CAPILLARY
GLUCOSE-CAPILLARY: 138 mg/dL — AB (ref 65–99)
GLUCOSE-CAPILLARY: 125 mg/dL — AB (ref 65–99)
Glucose-Capillary: 116 mg/dL — ABNORMAL HIGH (ref 65–99)
Glucose-Capillary: 131 mg/dL — ABNORMAL HIGH (ref 65–99)
Glucose-Capillary: 132 mg/dL — ABNORMAL HIGH (ref 65–99)
Glucose-Capillary: 134 mg/dL — ABNORMAL HIGH (ref 65–99)
Glucose-Capillary: 140 mg/dL — ABNORMAL HIGH (ref 65–99)

## 2015-12-21 LAB — COMPREHENSIVE METABOLIC PANEL
ALBUMIN: 1.6 g/dL — AB (ref 3.5–5.0)
ALT: 28 U/L (ref 17–63)
ANION GAP: 8 (ref 5–15)
AST: 31 U/L (ref 15–41)
Alkaline Phosphatase: 212 U/L — ABNORMAL HIGH (ref 38–126)
BILIRUBIN TOTAL: 1.4 mg/dL — AB (ref 0.3–1.2)
BUN: 40 mg/dL — ABNORMAL HIGH (ref 6–20)
CO2: 24 mmol/L (ref 22–32)
Calcium: 8.4 mg/dL — ABNORMAL LOW (ref 8.9–10.3)
Chloride: 115 mmol/L — ABNORMAL HIGH (ref 101–111)
Creatinine, Ser: 1.06 mg/dL (ref 0.61–1.24)
GFR calc Af Amer: >60 mL/min (ref >60)
GFR calc non Af Amer: >60 mL/min (ref >60)
GLUCOSE: 118 mg/dL — AB (ref 65–99)
POTASSIUM: 3.5 mmol/L (ref 3.5–5.1)
Sodium: 147 mmol/L — ABNORMAL HIGH (ref 135–145)
TOTAL PROTEIN: 6.1 g/dL — AB (ref 6.5–8.1)

## 2015-12-21 LAB — DIFFERENTIAL
BASOS ABS: 0 10*3/uL (ref 0.0–0.1)
BASOS PCT: 0 %
EOS ABS: 0.1 10*3/uL (ref 0.0–0.7)
Eosinophils Relative: 1 %
Lymphocytes Relative: 19 %
Lymphs Abs: 1.7 10*3/uL (ref 0.7–4.0)
MONOS PCT: 11 %
Monocytes Absolute: 1 10*3/uL (ref 0.1–1.0)
NEUTROS PCT: 69 %
Neutro Abs: 6.1 10*3/uL (ref 1.7–7.7)

## 2015-12-21 LAB — MAGNESIUM: Magnesium: 2 mg/dL (ref 1.7–2.4)

## 2015-12-21 LAB — PREALBUMIN: Prealbumin: 10.9 mg/dL — ABNORMAL LOW (ref 18–38)

## 2015-12-21 LAB — PHOSPHORUS: PHOSPHORUS: 4.3 mg/dL (ref 2.5–4.6)

## 2015-12-21 LAB — TRIGLYCERIDES: TRIGLYCERIDES: 199 mg/dL — AB (ref <150)

## 2015-12-21 MED ORDER — VITAL HIGH PROTEIN PO LIQD
1000.0000 mL | ORAL | Status: DC
  Filled 2015-12-21 (×2): qty 1000

## 2015-12-21 MED ORDER — BISACODYL 10 MG RE SUPP
10.0000 mg | Freq: Every day | RECTAL | Status: DC
  Administered 2015-12-21 – 2015-12-23 (×2): 10 mg via RECTAL
  Filled 2015-12-21 (×3): qty 1

## 2015-12-21 MED ORDER — TRACE MINERALS CR-CU-MN-SE-ZN 10-1000-500-60 MCG/ML IV SOLN
INTRAVENOUS | Status: AC
  Administered 2015-12-21: 18:00:00 via INTRAVENOUS
  Filled 2015-12-21 (×2): qty 1992

## 2015-12-21 MED ORDER — FAT EMULSION 20 % IV EMUL
240.0000 mL | INTRAVENOUS | Status: AC
  Administered 2015-12-21: 240 mL via INTRAVENOUS
  Filled 2015-12-21 (×2): qty 250

## 2015-12-21 MED ORDER — VITAL AF 1.2 CAL PO LIQD
1000.0000 mL | ORAL | Status: DC
  Administered 2015-12-21: 1000 mL
  Filled 2015-12-21 (×2): qty 1000

## 2015-12-21 MED ORDER — MORPHINE SULFATE (PF) 2 MG/ML IV SOLN
1.0000 mg | INTRAVENOUS | Status: DC | PRN
  Administered 2015-12-21 – 2015-12-23 (×2): 2 mg via INTRAVENOUS
  Filled 2015-12-21 (×2): qty 1

## 2015-12-21 NOTE — Progress Notes (Signed)
[place] NOTE   Pharmacy Consult for TPN  Patient Measurements: Height: 6' 2"  (188 cm) Weight: 182 lb 12.2 oz (82.9 kg) IBW/kg (Calculated) : 82.2 TPN AdjBW (KG): 97.2 Body mass index is 23.47 kg/m.  Insulin Requirements: 5 units Novolog in past 24 hours  Current Nutrition: NPO, TPN  IVF: None  Central access: PICC placed 10/12, pt pulled out 10/19. 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  Pharmacy consulted to start TPN for gastric ileus and obstruction 10/12. Patient underwent G and J tube placement this admission. On 10/19, patient pulled out PICC line. He also had to go back to OR after he removed his G tube accidentally, leading to peritonitis, intubation, and septic shock. Tube feed rate slowly advanced by CCS, but today patient noted to have dark brown output around J tube site, and tube feeds held. Pharmacy asked to resume TPN.   Significant Events:  10/19: Pulled G-tube leading to peritonitis, intubation, septic shock 10/30: EGD - esophagitis, edema/erythematous stomach, stenosed Billroth I gastroduodenostomy,  Gastric pouch w/ ulceration, gastroduodenal anastomosis with ulceration, non-bleeding ulcers found in duodenum and jejunum.  10/31 and 11/1: peritoneal drains removed by IR 11/3: Pulled out J tube, replaced by surgery. TFs on hold 11/6: Retry trophic TFs via J tube  Today, 12/21/15:   Glucose - No hx DM noted. CBGs at goal <150.  Electrolytes - Na and Cl- high (stable): Clinimix contains 35 meq/L of Na+ so not likely contributing much to elevated Na,  all other lytes including Corrected Ca WNL  Renal - AKI -resolved,  I/O: +406 cc/24h, UOP 0.7 ml/kg/hr.  Drains -675cc/24h.   LFTs - AST/ALT WNL, T.Bili improving, Alk phos rising  TGs -76 (10/13), 97 (10/16), 214 (10/26), 194 (10/30), 199 (11/6)  Prealbumin - 17.8 (10/11), 12.8 (10/13), 11 (10/16), 12.5 (10/18), < 5 (10/23), 6.4 (10/26), 9.2 (10/30), 10.9 (11/6)  NUTRITIONAL GOALS                                                                                             RD recs:  Kcal:1840-2090 kcal/kg, Protein: 100-117 g, Fluids: per MD/NP/surgery  Clinimix 5/15 at a goal rate of 70m/hr would provide 100g/day protein, 1894 Kcal/day.  **There is currently a  national backorder of Clinimix solution. To conserve supply, will keep goal rate of Clinimix 5/15 at 83 ml/hr + 20% fat emulsion at 35m/hr  PLAN                                                                                           At 1800 today:  Continue Clinimix E 5/15 to 83 ml/hr (max rate).   Continue electrolytes in TPN despite mild hypernatremia.    Watch K+ level, may be able to replace orally if needed if pt tolerates TFs  20% fat emulsion at 10 ml/hr.  TPN to contain standard multivitamins and trace elements.  IVF per MD.  Continue sensitive SSI q4h - consider changing to q6h if CBGs remain at goal.    TPN lab panels on Mondays & Thursdays.    F/u if pt tolerates TFs, wean TPN when TFs near goal.    D/C IV pepcid as pt also on PPI PT   F/u daily  CRalene Bathe PharmD, BCPS 12/21/2015, 9:01 AM  Pager: [phone]

## 2015-12-21 NOTE — Progress Notes (Addendum)
[place]  [address] [place], [place] [address] Phone: [phone] FAX: [phone]   [name] [id number] 1940-10-11  CARE TEAM:  PCP: Irven Shelling, MD  Outpatient Care Team: Patient Care Team: Lavone Orn, MD as PCP - General (Internal Medicine) Michael Boston, MD as Consulting Physician (General Surgery) Arta Silence, MD as Consulting Physician (Gastroenterology)  Inpatient Treatment Team: Treatment Team: Attending Provider: Michael Boston, MD; Consulting Physician: Arta Silence, MD; Technician: Sueanne Margarita, NT; Consulting Physician: Md Pccm, MD; Rounding Team: Md Pccm, MD; Chaplain: Hadley Pen, Chaplain; Registered Nurse: Kennyth Lose, RN; Registered Nurse: Carl Best, RN; Registered Nurse: Alba Destine, RN; Registered Nurse: Benny Lennert, RN  SURGERY 12/03/2015  POST-OPERATIVE DIAGNOSIS:    Peritonitis Jejunal disruption Gastric leak  PROCEDURE:    LAPAROSCOPY DIAGNOSTIC OMENTOPEXY of jejunal disruption [place] OUT x 21L with drains placement  SURGEON:  Michael Boston, MD   Post-Op 12/01/2015  POST-OPERATIVE DIAGNOSIS:    RECURRENT GASTRIC OUTLET OBSTRUCTION DUE to significant edema at gastroduodenal anastomosis. GASTRITIS Failure to thrive with malnutrition  PROCEDURE:   LAPAROSCOPIC PLACEMENT OF FEEDING JEJUNOSTOMY AND GASTROSTOMY TUBEs ESOPHAGOGASTRODUODENOSCOPY (EGD) BALLOON DILATION OF DUODENAL STRICTURE Gastric biopsies  Closure of gastrotomy  SURGEON:  Michael Boston, MD  Prior surgery 09/25/2015  POST-OPERATIVE DIAGNOSIS:  Partial gastric outlet obstruction due to chronic pyloric stricture  PROCEDURE:   XI ROBOTIC distal gastrectomy BILROTH I  ANASTOMOSIS  ANTERIOR AND POSTERIOR VAGOTOMY DOR (Anterior 242 degree) FUNDIPLICATION OMENTOPEXY UPPER ENDOSCOPY  SURGEON:  Michael Boston, MD   Problem List:   Principal Problem:   Bile peritonitis  due to gastric tube dislodgement Active Problems:   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 09/25/2015   Pneumatosis of intestines   Essential hypertension, benign   Hyperlipidemia   Anxiety state   Gastric outlet obstruction   Gastric Ileus    Encephalopathy acute   Endotracheally intubated   Protein-calorie malnutrition, severe   Acute respiratory failure with hypoxemia (HCC)   Tobacco abuse   Ischemic right index finger at tip   Gastritis   Gastric tube present (LUQ)   Jejunostomy tube present (LLQ)    Assessment/Plan:  GUARDED but recovering   Transfer to floor.  RNs hopeful he is more calm & needs less intense care.  Daughter agrees.   Return to SDU if fails  Retry trophic TF's via J tube.  CT scan if cannot tolerate or worsens to r/o worsening pneumatosis/hematoma/abscess/SBO.  Abd binder to cover tubes.    G tube to LIWS.  Gravity if drainage low/controlled with TFs.  Site care.  Hopefully as malbnutrition & ileus resolve, will get easier.  IV nutrition TNA for severe malnutrition.  Wean when tolerates TFs at goal  Follow off ABx.    Cultures negative = No more peritoneal drains.  Hopefully oiutside window of developing new intraperitoneal abscess  Sedation/Anxiolysis with confusion - challenge with pt that goes from sleeping to pulling at lines/tubes.  More calm a hopeful sign with Haldol, but guarded w pulling out tubes x3 this admission.  Mobilize & get more active.  Sundowning precautions.    Off vent # days a hopeful sign  Speech therapy reevaluate to rule out aspiration and see if he can try doing some drinking up to a pureed diet with the G-tube in place.  Deconditioned = guarded with PO attempts.  MBS eval in future when stronger (maybe with trach downsized ?  Follow &  replace electrolytes (esp K) as needed  D/c foley if can get up more.  Able to pass endoscopy tube across the gastroduodenal Bilroth anastomosis signs the gastric  outlet obstruction from inflammation is improving.  Inflammatory narrowing at gastroduodenal Bilroth I anastomosis.  The fact that there is bilious reflux into the stomach argues against a complete obstruction.  Skin care.   F/u Bx's for Hpylori neg.  Carafate & PPI via Gtube as tolerated.    Pneumatosis & jejunal bowel wall thickening.  Lactate <2.   No SMA thrombosis.  No evidence of any embolic disease in the mesentery.  Off pressors.  WBC stable Suspect this is source of GL bleeding (NGT & Gtube bilious, not bloody.  EGD without bleeding in stomach/duodenum.   Jejunostomy tube replaced.  No perforation.  No extravasation of contrast around jejunostomy tube.   BMs no longer bloody.  Hold lovenox if Hgb <8.  CT scan if cannot tolerates TFs.  If becomes complete SBO, may need SBR - try to hold off  I would not operate on this patient unless he goes under septic shock with high lactate, severe distention and peritonitis.  Extremely hostile abdomen with high risk of enterocutaneous fistula and breakdown of any small bowel resection.  Any bowel obstruction would be related to inflammation and adhesions and an early inflammatory stage.  Would manage conservatively anyway.  Having BMs = not complete SBO.  Continue proximal decompression with orogastric active LIWS suction, gastric tube to gravity (to keep pressure down but not actively suck around Gtube skin leak), jejunostomy tube back to gravity.  VTE prophylaxis - SCDs.  Hold off on enoxaparin if Hgb<8 s or persistent bleeding.   Seems like the GI bleeding has resolved.  Brachial artery embolus to right index finger resolved.   D/w Dr. Trula Slade with vascular surgery.   Duplex study argues against any more brachial clot with good blood supply.  Finger tip improving  Nicotene patch   Disposition: He will definitetly require skilled care facility with rehabilitation capacity upon discharge from the hospital.  I suspect it's going to take at least a few weeks  before he'll be ready for that transition.    I discussed operative findings, updated the patient's status, discussed probable steps to recovery, and gave postoperative recommendations to the patient and family. (daughter).    ICU RNs.  Discussion with critical-care nursing staff.  Recommendations were made.  Questions were answered.  They expressed understanding & appreciation.    [name], M.D., F.A.C.S. Gastrointestinal and Minimally Invasive Surgery [facility] [address], [place], [place] [address] [phone] Main / Paging    12/21/2015   Subjective:  Confused but not agitated Getting up more - tired out  Daughter in room  ICU RN Herbie Baltimore) in room   Objective:  Vital signs:  Vitals:   12/21/15 0344 12/21/15 0400 12/21/15 0418 12/21/15 0500  BP:  (!) 157/51  (!) 148/47  Pulse:      Resp:  18  19  Temp: 98.5 F (36.9 C)     TempSrc: Oral     SpO2:  100%  98%  Weight:   82.9 kg (182 lb 12.2 oz)   Height:        Last BM Date: 12/17/15  Intake/Output   Yesterday:  11/05 0701 - 11/06 0700 In: 2348.2 [I.V.:2248.2; IV Piggyback:100] Out: 1860 [KYHCW:2376; Drains:675] This shift:  Total I/O In: 980 [I.V.:930; IV Piggyback:50] Out: 510 [Urine:285;  Drains:225]  Bowel function:  Flatus: YES  BM:  No -last 2 days ago  Gtube: thin light bile - no blood.  Leaking around skin not more inflamed Jtube site: scant bilious drainage stopped w upsized 16Fr  Physical Exam:  General: Pt awakee.  Mild c/o abd pain but not severe.  Less confused.  Tired.  No acute distress.   Eyes: PERRL, normal EOM.  Sclera clear.  No icterus Neuro: CN II-XII intact w/o focal sensory/motor deficits. Lymph: No head/neck/groin lymphadenopathy Psych:  No delerium/psychosis/paranoia HENT: Normocephalic, Mucus membranes moist.  No thrush. ETT in place. Neck: Supple, No tracheal deviation Chest: CTAB. Chest wall pain w good excursion CV:   Pulses intact.  Regular rhythm MS: Normal AROM mjr joints.  No obvious deformity Abdomen:  G tube LUQ.  Rash around skin fair with drainage  J tube in LLQ - skin irritated but stable  Edema left flank.   Softer.  Nondistended.  Tenderness at LLQ mild no guarding.  No pain elsewhere.   GU:  Foley in place.  NEMG Ext:  SCDs BLE.  No mjr edema.  Right index finger unchanged - distal pad & nail bed with cyanosis.  Good cap refill to nail bed.  PICC LUE clean Skin: 1+ anasarca.  No other petechiae / purpura  Results:   Diagnosis Stomach, biopsy - CHRONIC FOCALLY ACTIVE GASTRITIS. - THERE IS NO EVIDENCE OF HELICOBACTER PYLORI, DYSPLASIA OR MALIGNANCY. - SEE COMMENT. Microscopic Comment A Warthin-Starry stain is negative for the presence of Helicobacter pylori organisms. (JBK:gt, 12/04/15) Enid Cutter MD Pathologist, Electronic Signature (Case signed 12/04/2015) Specimen [name] and Clinical Information Specimen(s) Obtained: Stomach, biopsy Specimen Clinical  Labs: Results for orders placed or performed during the hospital encounter of 11/24/15 (from the past 48 hour(s))  Glucose, capillary     Status: Abnormal   Collection Time: 12/19/15  7:51 AM  Result Value Ref Range   Glucose-Capillary 138 (H) 65 - 99 mg/dL   Comment 1 Notify RN    Comment 2 Document in Chart   Glucose, capillary     Status: Abnormal   Collection Time: 12/19/15 11:49 AM  Result Value Ref Range   Glucose-Capillary 137 (H) 65 - 99 mg/dL   Comment 1 Notify RN    Comment 2 Document in Chart   Glucose, capillary     Status: Abnormal   Collection Time: 12/19/15  3:48 PM  Result Value Ref Range   Glucose-Capillary 146 (H) 65 - 99 mg/dL   Comment 1 Notify RN    Comment 2 Document in Chart   Glucose, capillary     Status: Abnormal   Collection Time: 12/19/15  8:12 PM  Result Value Ref Range   Glucose-Capillary 134 (H) 65 - 99 mg/dL  Glucose, capillary     Status: Abnormal   Collection Time: 12/19/15 11:06 PM   Result Value Ref Range   Glucose-Capillary 133 (H) 65 - 99 mg/dL  Basic metabolic panel     Status: Abnormal   Collection Time: 12/20/15  5:34 AM  Result Value Ref Range   Sodium 148 (H) 135 - 145 mmol/L   Potassium 3.6 3.5 - 5.1 mmol/L   Chloride 116 (H) 101 - 111 mmol/L   CO2 25 22 - 32 mmol/L   Glucose, Bld 142 (H) 65 - 99 mg/dL   BUN 43 (H) 6 - 20 mg/dL   Creatinine, Ser 1.03 0.61 - 1.24 mg/dL   Calcium 8.3 (L) 8.9 - 10.3 mg/dL  GFR calc non Af Amer >60 >60 mL/min   GFR calc Af Amer >60 >60 mL/min    Comment: (NOTE) The eGFR has been calculated using the CKD EPI equation. This calculation has not been validated in all clinical situations. eGFR's persistently <60 mL/min signify possible Chronic Kidney Disease.    Anion gap 7 5 - 15  Glucose, capillary     Status: Abnormal   Collection Time: 12/20/15  5:35 AM  Result Value Ref Range   Glucose-Capillary 121 (H) 65 - 99 mg/dL  Glucose, capillary     Status: Abnormal   Collection Time: 12/20/15  7:56 AM  Result Value Ref Range   Glucose-Capillary 142 (H) 65 - 99 mg/dL   Comment 1 Notify RN    Comment 2 Document in Chart   Glucose, capillary     Status: Abnormal   Collection Time: 12/20/15 11:54 AM  Result Value Ref Range   Glucose-Capillary 120 (H) 65 - 99 mg/dL   Comment 1 Notify RN    Comment 2 Document in Chart   Magnesium     Status: None   Collection Time: 12/20/15  1:55 PM  Result Value Ref Range   Magnesium 2.0 1.7 - 2.4 mg/dL  Glucose, capillary     Status: Abnormal   Collection Time: 12/20/15  3:42 PM  Result Value Ref Range   Glucose-Capillary 125 (H) 65 - 99 mg/dL   Comment 1 Notify RN    Comment 2 Document in Chart   Glucose, capillary     Status: Abnormal   Collection Time: 12/20/15  7:53 PM  Result Value Ref Range   Glucose-Capillary 140 (H) 65 - 99 mg/dL   Comment 1 Document in Chart   Glucose, capillary     Status: Abnormal   Collection Time: 12/20/15 11:08 PM  Result Value Ref Range    Glucose-Capillary 133 (H) 65 - 99 mg/dL  Glucose, capillary     Status: Abnormal   Collection Time: 12/21/15  3:43 AM  Result Value Ref Range   Glucose-Capillary 138 (H) 65 - 99 mg/dL  Magnesium     Status: None   Collection Time: 12/21/15  5:30 AM  Result Value Ref Range   Magnesium 2.0 1.7 - 2.4 mg/dL  Phosphorus     Status: None   Collection Time: 12/21/15  5:30 AM  Result Value Ref Range   Phosphorus 4.3 2.5 - 4.6 mg/dL  CBC     Status: Abnormal   Collection Time: 12/21/15  5:30 AM  Result Value Ref Range   WBC 8.9 4.0 - 10.5 K/uL   RBC 2.91 (L) 4.22 - 5.81 MIL/uL   Hemoglobin 8.4 (L) 13.0 - 17.0 g/dL   HCT 25.9 (L) 39.0 - 52.0 %   MCV 89.0 78.0 - 100.0 fL   MCH 28.9 26.0 - 34.0 pg   MCHC 32.4 30.0 - 36.0 g/dL   RDW 15.2 11.5 - 15.5 %   Platelets 473 (H) 150 - 400 K/uL  Differential     Status: None   Collection Time: 12/21/15  5:30 AM  Result Value Ref Range   Neutrophils Relative % 69 %   Neutro Abs 6.1 1.7 - 7.7 K/uL   Lymphocytes Relative 19 %   Lymphs Abs 1.7 0.7 - 4.0 K/uL   Monocytes Relative 11 %   Monocytes Absolute 1.0 0.1 - 1.0 K/uL   Eosinophils Relative 1 %   Eosinophils Absolute 0.1 0.0 - 0.7 K/uL   Basophils Relative 0 %  Basophils Absolute 0.0 0.0 - 0.1 K/uL  Comprehensive metabolic panel     Status: Abnormal   Collection Time: 12/21/15  5:30 AM  Result Value Ref Range   Sodium 147 (H) 135 - 145 mmol/L   Potassium 3.5 3.5 - 5.1 mmol/L   Chloride 115 (H) 101 - 111 mmol/L   CO2 24 22 - 32 mmol/L   Glucose, Bld 118 (H) 65 - 99 mg/dL   BUN 40 (H) 6 - 20 mg/dL   Creatinine, Ser 1.06 0.61 - 1.24 mg/dL   Calcium 8.4 (L) 8.9 - 10.3 mg/dL   Total Protein 6.1 (L) 6.5 - 8.1 g/dL   Albumin 1.6 (L) 3.5 - 5.0 g/dL   AST 31 15 - 41 U/L   ALT 28 17 - 63 U/L   Alkaline Phosphatase 212 (H) 38 - 126 U/L   Total Bilirubin 1.4 (H) 0.3 - 1.2 mg/dL   GFR calc non Af Amer >60 >60 mL/min   GFR calc Af Amer >60 >60 mL/min    Comment: (NOTE) The eGFR has been  calculated using the CKD EPI equation. This calculation has not been validated in all clinical situations. eGFR's persistently <60 mL/min signify possible Chronic Kidney Disease.    Anion gap 8 5 - 15    Imaging / Studies: No results found.  Medications / Allergies: per chart  Antibiotics: Anti-infectives    Start     Dose/Rate Route Frequency Ordered Stop   12/09/15 1200  anidulafungin (ERAXIS) 100 mg in sodium chloride 0.9 % 100 mL IVPB     100 mg over 90 Minutes Intravenous Every 24 hours 12/09/15 0918 12/10/15 1333   12/05/15 1200  anidulafungin (ERAXIS) 100 mg in sodium chloride 0.9 % 100 mL IVPB  Status:  Discontinued     100 mg over 90 Minutes Intravenous Every 24 hours 12/04/15 1045 12/09/15 0835   12/04/15 1200  anidulafungin (ERAXIS) 200 mg in sodium chloride 0.9 % 200 mL IVPB     200 mg over 180 Minutes Intravenous  Once 12/04/15 1045 12/04/15 1725   12/04/15 0600  cefoTEtan (CEFOTAN) 2 g in dextrose 5 % 50 mL IVPB     2 g 100 mL/hr over 30 Minutes Intravenous On call to O.R. 12/03/15 1821 12/03/15 2007   12/04/15 0400  piperacillin-tazobactam (ZOSYN) IVPB 3.375 g  Status:  Discontinued     3.375 g 12.5 mL/hr over 240 Minutes Intravenous Every 8 hours 12/03/15 2229 12/15/15 0751   12/02/15 0000  erythromycin ethylsuccinate (EES) 200 MG/5ML suspension 400 mg  Status:  Discontinued    Comments:  Administer via J tube (16Fr LLQ)   400 mg Oral Every 6 hours 12/01/15 2020 12/03/15 0748   11/27/15 1200  erythromycin 500 mg in sodium chloride 0.9 % 100 mL IVPB     500 mg 100 mL/hr over 60 Minutes Intravenous Every 6 hours 11/27/15 0940 11/29/15 0608   11/26/15 0830  erythromycin 250 mg in sodium chloride 0.9 % 100 mL IVPB  Status:  Discontinued     250 mg 100 mL/hr over 60 Minutes Intravenous Every 6 hours 11/26/15 0751 11/27/15 0940        Note: Portions of this report may have been transcribed using voice recognition software. Every effort was made to ensure  accuracy; however, inadvertent computerized transcription errors may be present.   Any transcriptional errors that result from this process are unintentional.    [name], M.D., F.A.C.S. Gastrointestinal and Minimally Invasive Surgery [facility] [address] [place], [place] [address] [phone] Main / Paging     12/21/2015

## 2015-12-21 NOTE — Progress Notes (Signed)
Nutrition Follow-up  DOCUMENTATION CODES:   Severe malnutrition in context of acute illness/injury  INTERVENTION:  - Will change to Vital 1.2 @ 20 mL/hr which will provide 276 kcal, 36 grams of protein, and 389 mL free water.  - Continue TPN per Pharmacy. - RD will follow-up 11/7.  NUTRITION DIAGNOSIS:   Inadequate oral intake related to inability to eat as evidenced by NPO status. -ongoing  GOAL:   Patient will meet greater than or equal to 90% of their needs -met with TPN alone.  MONITOR:   TF tolerance, Weight trends, Labs, Skin, I & O's, Other (Comment) (TPN regimen)  ASSESSMENT:   75 year old male presenting for a post-operative visit. Note for "Post-Operative": Patient returns one month status post robotic resection.  Distal gastrectomy with Billroth I gastroduodenal reconstruction for chronic ulcer causing obstruction.  09/25/2015  11/6 New consult for TF initiation and management. Pt with trach collar. Per Dr. Johney Maine' note this AM, he is to transfer to the floor today. Also per note, plan to retry trophic TF via J-tube today and plan to begin TPN wean once TF is at goal rate. SLP is to reevaluate with hope for PO intakes to be possible.  Order currently in place for Vital High Protein @ 20 mL/hr. Will change to Vital 1.2 @ 20 mL/hr as pt was receiving Vital 1.2 @ 40 mL/hr prior to J-tube replacement last week. Goal for TF: Vital 1.2 @ 65 mL/hr which will provide 1872 kcal, 117 grams of protein, and 1265 mL free water.   Per pharmacy note this AM, plan to continue Clinimix E 5/15 @ 83 mL/hr with 20% ILE @ 10 mL/hr today. Per chart review, weight -2.3 kg since previous assessment; will continue to monitor weight trends and adjust as warranted.    Medications reviewed; 10 mg Dulcolax/day, sliding scale Novolog, PRN IV Zofran, 40 mg Protonix BID, PRN IV Compazine.  Labs reviewed; CBGs: 134 and 138 mg/dL, Na: 147 mmol/L, Cl: 115 mmol/L, BUN: 40 mg/dL, Ca: 8.4 mg/dL, Alk Phos  elevated.    11/3 - Pt continues with trach collar. RN note from this AM at [phone] states: Patient's J tube not in place, and G tube sutures are loose.  - Per Dr. Johney Maine' note at [phone]: pt had pulled J-tube ~1.5 hours prior to RN note and new J-tube (16 [place]) now in place, xray confirmation pending, and plan for J-tube to gravity over the weekend and re-start trophic TF in a few days.  - Pt continues with Clinimix E 5/15 @ 83 mL/hr with 20% ILE @ 10 mL/hr. This regimen is providing 1900 kcal and 100 grams of protein.   - Weight +1.6 kg from yesterday; will continue to monitor weight fluctuations.  - MBS done yesterday and note associated with this (1233 yesterday) reviewed.    11/2 - Pt continues with trach collar.  - Estimated kcal need updated this AM as weight -2.6 kg since yesterday.  - Will continue to monitor weight trends and adjust as needed.  - He is receiving TPN: Clinimix E 5/15 @ 83 mL/hr with 20% ILE @ 10 mL/hr which is providing 1900 kcal and 100 grams of protein (100% estimated kcal and protein need).  - Per Dr. Johney Maine' note this AM, plan to increase Vital 1.2 to 40 mL/hr today (1152 kcal, 72 grams of protein, and 779 mL free water).  - Spoke with Pharmacist about TPN and plans for possible weaning and d/c of TPN in the near  future.  - Goal for TF: Vital 1.2 @ 70 mL/hr which will provide 2016 kcal, 126 grams of protein (108% max re-estimated protein need), and 1362 mL free water.  - Pt out of room at time of RD visit.  - Family in the room and spoke with them about current plan for TF advancement. - Per rounds this AM, pt had MBS this AM and was unable to pass; will monitor for note associated to this and ongoing SLP plan.  - Also per rounds, G-tube continues to drain yellow bile-like contents.    Diet Order:  Diet NPO time specified Except for: Ice Chips TPN (CLINIMIX-E) Adult TPN (CLINIMIX-E) Adult  Skin:  Wound (see comment) (Abdominal incision)  Last BM:   11/2  Height:   Ht Readings from Last 1 Encounters:  12/08/15 6' 2"  (1.88 m)    Weight:   Wt Readings from Last 1 Encounters:  12/21/15 182 lb 12.2 oz (82.9 kg)    Ideal Body Weight:  80.9 kg  BMI:  Body mass index is 23.47 kg/m.  Estimated Nutritional Needs:   Kcal:  1840-2090 (22-25 kcal/kg)  Protein:  100-117 grams (1.2-1.4 grams/kg)  Fluid:  per MD/NP/Surgery  EDUCATION NEEDS:   No education needs identified at this time    Jarome Matin, MS, RD, LDN Inpatient Clinical Dietitian Pager # [phone] After hours/weekend pager # [phone]

## 2015-12-21 NOTE — Progress Notes (Signed)
Speech Language Pathology Treatment: Dysphagia;Passy Muir Speaking valve  Patient Details Name: [name] MRN: [id number] DOB: October 06, 1940 Today's Date: 12/21/2015 Time: [id number] SLP Time Calculation (min) (ACUTE ONLY): 30 min  Assessment / Plan / Recommendation Clinical Impression  Patient seen to determine tolerance of pmsv and ice chips via po.  Also to determine readiness for repeat instrumental swallow evaluation.  Pt has just been transferred to floor from ICU and wife present.  Upon SLP entrance to room, pt had PMSV in place but was not verbalizing much.  He did speak with encouragement but phonation strength was weak.   No breath stacking noted with removal of valve.    SLP set up portable suction unit and demonstrated its use to [name].   Gurgling breathing quality noted at baseline.  Unfortunately pt unable to expectorate secretions and reports being "tired".  SLP did provide pt with single ice chips = mild delayed swallow.  No indication of overt aspiration however [facility]  Do not recommend to proceed with MBS 11/7 if pt continues with gross weakness and inability to expel secretions.  Pt and wife agreeable to follow up next date to determine clinically if proceeding with MBS is indicated.    Pt and wife encouraged to continue ice chips and strengthening cough/expectoration abilities.    HPI HPI: 75 yo male former smoker with progressive epigastric pain, nausea, vomiting, bloating from gastric outlet obstruction with pyloric channel ulcer.  Had Billroth 1, vagotomy, fundoplication, omentopexy August 2017 with initial improvement.  Had recurrent symptoms September 2017 that become progressively worse with gastric dilation from recurrent stricture.  Had laparoscopic placement of jejunostomy tube, gastrostomy tube, and EGD balloon dilation of duodenal stricture 10/17.  Developed gastric leakage with peritonitis after pulling out PEG tube  10/19 and taken to OR. On 10/27 found 2 new LUQ abscesses/fluid collections which were drained percutaneously by IR. Had perc trach 10/27.  As of 10/30 his mental status has improved, he has remained off of vasopressors for several days and he has done well with weaning off the ventilator per MD note.       SLP Plan  Continue with current plan of care (do not recommend to proceed with MBS at this time due to pt's overt difficulties with management of secretions; recommend defer until pt secretions are improved and pt able to expectorate)     Recommendations  Diet recommendations: NPO (ice chips only, fully alert, pmsv in place) Medication Administration: Via alternative means      Patient may use Passy-Muir Speech Valve: During all therapies with supervision;Caregiver trained to provide supervision (as tolerated, remove if pt fatigued) MD: Please consider changing trach tube to : Cuffless (when MD indicates)         Oral Care Recommendations: Oral care prior to ice chip/H20 Follow up Recommendations: Skilled Nursing facility;LTACH Plan: Continue with current plan of care (do not recommend to proceed with MBS at this time due to pt's overt difficulties with management of secretions; recommend defer until pt secretions are improved and pt able to expectorate)       GO              Donavan Burnetamara Tammara Massing, MS [facility] SLP [phone]   Chales AbrahamsKimball, Rai Sinagra Ann 12/21/2015, 6:49 PM

## 2015-12-21 NOTE — Progress Notes (Signed)
PT Cancellation Note  Patient Details Name: [name] MRN: [id number] DOB: October 29, 1940   Cancelled Treatment:     [place] [facility] [place]  Will attempt to see tomorrow.   Felecia ShellingLori Breena Bevacqua  PTA [facility]  Acute  Rehab Pager      [phone]

## 2015-12-21 NOTE — Progress Notes (Signed)
OT Cancellation Note  Patient Details Name: [name] MRN: [id number] DOB: January 15, 1941   Cancelled Treatment:    Reason Eval/Treat Not Completed: Fatigue/lethargy limiting ability to participate  Pt had just gotten back in bed.  Lise AuerLori Chelsi Warr, [place] [phone]  Einar CrowEDDING, Ha Shannahan D 12/21/2015, 4:11 PM

## 2015-12-21 NOTE — Progress Notes (Signed)
PCCM PROGRESS NOTE  Admission date: 11/24/2015 Consult date: 12/03/2015 Referring provider: Dr. Michaell CowingGross  CC: Abdominal pain  BRIEF:  75 y/o male with peptic ulcer disease admitted on 10/10 with recurrent gastric outlet obstruction secondary to gastritis admitted underwent G and J tube placement this admission.  On 10/19 had to go back to the OR after he removed his G tube accidentally leading to peritonitis, intubation, septic shock.  Subjective:  afebrile Currently tolerating PMV under supervision C/o back pain (chronic) & abdominal pain   Vital signs: BP (!) 133/51   Pulse 88   Temp 98.5 F (36.9 C) (Oral)   Resp 20   Ht 6\' 2"  (1.88 m)   Wt 182 lb 12.2 oz (82.9 kg)   SpO2 100%   BMI 23.47 kg/m   Intake/output: I/O last 3 completed shifts: In: 3439.2 [I.V.:3289.2; IV Piggyback:150] Out: 3210 [Urine:2360; Drains:850]   : FiO2 (%):  [28 %] 28 %  General:chronically ill appearing  HENT: NCAT Tracheostomy clean, dry, intact, PMV in place, weak but audible voice.  PULM: CTA B, normal effort CV: RRR, no mgr GI: BS+, G and J  in place Derm: no rash, mild edema unchanged Neuro: awake, answers questions, globally weak but non-focal  CMP Latest Ref Rng & Units 12/21/2015 12/20/2015 12/18/2015  Glucose 65 - 99 mg/dL 161(W118(H) 960(A142(H) 540(J137(H)  BUN 6 - 20 mg/dL 81(X40(H) 91(Y43(H) 78(G46(H)  Creatinine 0.61 - 1.24 mg/dL 9.561.06 2.131.03 0.861.24  Sodium 135 - 145 mmol/L 147(H) 148(H) 147(H)  Potassium 3.5 - 5.1 mmol/L 3.5 3.6 3.8  Chloride 101 - 111 mmol/L 115(H) 116(H) 119(H)  CO2 22 - 32 mmol/L 24 25 25   Calcium 8.9 - 10.3 mg/dL 5.7(Q8.4(L) 8.3(L) 8.2(L)  Total Protein 6.5 - 8.1 g/dL 6.1(L) - -  Total Bilirubin 0.3 - 1.2 mg/dL 4.6(N1.4(H) - -  Alkaline Phos 38 - 126 U/L 212(H) - -  AST 15 - 41 U/L 31 - -  ALT 17 - 63 U/L 28 - -    CBC Latest Ref Rng & Units 12/21/2015 12/18/2015 12/15/2015  WBC 4.0 - 10.5 K/uL 8.9 10.6(H) 10.2  Hemoglobin 13.0 - 17.0 g/dL 6.2(X8.4(L) 5.2(W8.6(L) 4.1(L8.8(L)  Hematocrit 39.0 - 52.0 %  25.9(L) 26.0(L) 26.4(L)  Platelets 150 - 400 K/uL 473(H) 573(H) 633(H)   ABG    Component Value Date/Time   PHART 7.465 (H) 12/18/2015 1030   PCO2ART 32.8 12/18/2015 1030   PO2ART 76.2 (L) 12/18/2015 1030   HCO3 23.3 12/18/2015 1030   TCO2 26 12/04/2007 0638   ACIDBASEDEF 7.6 (H) 12/07/2015 2230   O2SAT 95.1 12/18/2015 1030   CBG (last 3)   Recent Labs  12/20/15 2308 12/21/15 0343 12/21/15 0737  GLUCAP 133* 138* 134*   Imaging: No results found.PCXR w/ minimal L base atx. Aeration improved. Trach good position.   Studies: 10/11 CT abdomen/pelvis> significant distension of the stomach with layering fluid and gas, NG present, worrisome for recurrent gastric outlet obstruction.  RLL consolidation, cholecycstectomy 10/26 CT abdomen/pelvis> Moderate dilatation of proximal and mid small bowel loops, with transition point in anterior right lower quadrant, suspicious for partial small bowel obstruction. Pneumatosis involving multiple dilated small bowel loops in the left abdomen pelvis, suspicious for bowel ischemia. No evidence of portal venous gas or free intraperitoneal air. Two left upper quadrant fluid collections adjacent to the stomach and spleen, which may represent postoperative fluid collections or abscesses. Small amount free fluid also noted in pelvis.Small bilateral pleural effusions and bibasilar atelectasis. Diffuse mesenteric and body  wall edema. 11/2 Modified barium swallow> silent aspiration  Cultures: Blood 10/19 >> neg Fluid cx 10/17 >> neg Fluid cx 10/27 >> neg  Antibiotics: Zosyn 10/19 >> off Anidulafungin 10/19 >> 10/26   Events: 10/10  admission 10/17  Lap placement of J & G tubes, EGD w/ balloon dilation of duodenal stricture, gastric biopsies, closure of gastrotomy 10/18  sundowning, pulled out G tube 10/19  laparaoscopy (findings gastric leak, bilious ascites, jejunal disruption), omentoplexy of jejunal disruption, gastric leak 10/24 extubated.  Required re-intubation.  10/25: placed on precedex for acute encephalopathy; new drainage from J tube. tubefeeds stopped.   10/26 Heparin off since this am (as recommended by vascular). Still on pressors. Getting albumin and lasix to a/w mobilizing third spaced fluid/anasarca. Developed rectal bleeding. This has initiated after starting bowel prep. Hgb has drifted from 8 to 7.7 --> stopped [place] heparin. CT found evidence of bowel pneumotosis worrisome for ischemia, and two LUQ fluid collections 10/27: trach placed. TO IR for Perc drains to be placed in LUQ 10/30 EGD : mild esophagitis, evidence of billroth procedure, gastric pouch, non-bleeding duodenal ulcers, non-bleeding jejunal ulcers, could not reach j - tube 10/31: PMSV trial, pt tolerated well 11/1: >24 hours on trach collar; PSMV in place   Lines/tube: 10/19 ETT >> 10/24 then replaced 10/24 > 10/27 10/19 CVL left subclavian >>  10/19 R brachial arterial line >> 10/20 10/19 abdominal drain x3 >> 10/19 G tube >> 10/19 J tube >> 10/27 Trach placed (feinstein # 6 cuffed), Abd drains by IR 10/29 J dislodged, replaced by IR 11/3 early AM pulled out J tube, replaced by surgery  Summary: 75 yo male former smoker adm with gastric outlet obstruction with pyloric channel ulcer.  Had Billroth 1, vagotomy, fundoplication, omentopexy August 2017 with initial improvement.  Had recurrent symptoms September 2017 that become progressively worse with gastric dilation from recurrent stricture.  Had laparoscopic placement of jejunostomy tube, gastrostomy tube, and EGD balloon dilation of duodenal stricture 10/17.  Developed gastric leakage with peritonitis after pulling out PEG tube 10/19 and taken to OR. On 10/27 found 2 new LUQ abscesses/fluid collections which were drained percutaneously by IR. Had perc trach 10/27.     PMHx of CAD s/p stent, GERD, HTN, HLD  Assessment/plan:  Acute and chronic respiratory failure  Tracheostomy status.  -  tracheostomy collar O2 supplementation to maintain O2 > 90% - PMSV in place when supervised - Cont routine trach care  - can change to cuffless trach by 11/10 - would consider decannulation only when he is ambulatory  Recurrent gastric outlet obstruction GI bleeding> source inflamed small bowel? > resolved Abdominal abscess/fluid collections s/p IR drains Protein-calorie malnutrition Dislodged J overnight 11/3 > replaced - G and J tube management per surgery - nutrition per GI surgery - TPN per surgery - monitoring off of antibiotics - SLP eval  Hypertension with Sinus tachycardia - low dose metoprolol  AKI >> resolved Hypernatremia  Hyperchloremia  - monitor renal function, urine outpt - d/c'd D5W given hyperglycemia - replace electrolytes as needed  Acute metabolic encephalopathy, improved 11/4 Post-op pain Severe Physical deconditioning  - change to PRN moprhine for pain - haldol prn for severe agitation  - continue scheduled haldol, monitor QTc on tele monitor  Rt index finger ischemia likely from Rt brachial a line. > improved, no clot on upper ext doppler ultrasound, good pulse - VVS consulted 10/20, appreciate input-->resolved  - no more Arterial lines right arm  Hyperglycemia - SSI  Anasarca - cont nutritional support    SUP - Famotidine IV DVT prophylaxis - SCDs Goals of care - Full code  Updated daughter  [name] MourningRakesh Alva MD. FCCP. [facility] Pager [phone] If no response call [phone]     12/21/2015, 8:41 AM

## 2015-12-21 NOTE — Progress Notes (Addendum)
Pt transferred to floor today, note per surgeon documentation - swallow reevaluation indicated.  Given pt silent dysphagia, repeat MBS indicated prior to initiating po diet.  SLP to see pt at bedside today to determine readiness for MBS.  Thanks. Donavan Burnetamara Vaanya Shambaugh, MS [facility] SLP    [phone]

## 2015-12-22 LAB — GLUCOSE, CAPILLARY
GLUCOSE-CAPILLARY: 124 mg/dL — AB (ref 65–99)
GLUCOSE-CAPILLARY: 106 mg/dL — AB (ref 65–99)
Glucose-Capillary: 138 mg/dL — ABNORMAL HIGH (ref 65–99)
Glucose-Capillary: 102 mg/dL — ABNORMAL HIGH (ref 65–99)
Glucose-Capillary: 117 mg/dL — ABNORMAL HIGH (ref 65–99)

## 2015-12-22 LAB — BASIC METABOLIC PANEL
Anion gap: 8 (ref 5–15)
BUN: 47 mg/dL — AB (ref 6–20)
CHLORIDE: 117 mmol/L — AB (ref 101–111)
CO2: 23 mmol/L (ref 22–32)
CREATININE: 0.95 mg/dL (ref 0.61–1.24)
Calcium: 8.1 mg/dL — ABNORMAL LOW (ref 8.9–10.3)
GFR calc Af Amer: >60 mL/min (ref >60)
GFR calc non Af Amer: >60 mL/min (ref >60)
Glucose, Bld: 146 mg/dL — ABNORMAL HIGH (ref 65–99)
Potassium: 3.3 mmol/L — ABNORMAL LOW (ref 3.5–5.1)
SODIUM: 148 mmol/L — AB (ref 135–145)

## 2015-12-22 MED ORDER — VITAL AF 1.2 CAL PO LIQD
1000.0000 mL | ORAL | Status: DC
  Administered 2015-12-23: 1000 mL
  Filled 2015-12-22: qty 1000

## 2015-12-22 MED ORDER — INSULIN ASPART 100 UNIT/ML ~~LOC~~ SOLN
0.0000 [IU] | Freq: Four times a day (QID) | SUBCUTANEOUS | Status: DC
  Administered 2015-12-23 – 2015-12-24 (×3): 1 [IU] via SUBCUTANEOUS

## 2015-12-22 MED ORDER — FAT EMULSION 20 % IV EMUL
240.0000 mL | INTRAVENOUS | Status: AC
  Administered 2015-12-22: 240 mL via INTRAVENOUS
  Filled 2015-12-22: qty 240

## 2015-12-22 MED ORDER — POTASSIUM CHLORIDE 20 MEQ/15ML (10%) PO SOLN
40.0000 meq | Freq: Once | ORAL | Status: AC
  Administered 2015-12-22: 40 meq
  Filled 2015-12-22: qty 30

## 2015-12-22 MED ORDER — POTASSIUM CHLORIDE 20 MEQ/15ML (10%) PO SOLN
40.0000 meq | Freq: Once | ORAL | Status: AC
  Administered 2015-12-22: 40 meq via ORAL
  Filled 2015-12-22: qty 30

## 2015-12-22 MED ORDER — ACETAMINOPHEN 160 MG/5ML PO SOLN
650.0000 mg | ORAL | Status: DC | PRN
  Administered 2015-12-22: 650 mg
  Filled 2015-12-22: qty 20.3

## 2015-12-22 MED ORDER — CLINIMIX E/DEXTROSE (5/20) 5 % IV SOLN
INTRAVENOUS | Status: AC
Start: 2015-12-22 — End: 2015-12-23
  Administered 2015-12-22: 17:00:00 via INTRAVENOUS
  Filled 2015-12-22: qty 960

## 2015-12-22 MED ORDER — VITAL AF 1.2 CAL PO LIQD
1000.0000 mL | ORAL | Status: DC
  Administered 2015-12-22: 1000 mL
  Filled 2015-12-22: qty 1000

## 2015-12-22 NOTE — Progress Notes (Signed)
[facility]  [address] [place], [place] [address] Phone: [phone] FAX: [phone]   [name] [id number] Aug 06, 1940  CARE TEAM:  PCP: Irven Shelling, MD  Outpatient Care Team: Patient Care Team: Lavone Orn, MD as PCP - General (Internal Medicine) Michael Boston, MD as Consulting Physician (General Surgery) Arta Silence, MD as Consulting Physician (Gastroenterology)  Inpatient Treatment Team: Treatment Team: Attending Provider: Michael Boston, MD; Consulting Physician: Arta Silence, MD; Technician: Sueanne Margarita, NT; Consulting Physician: Md Pccm, MD; Rounding Team: Md Pccm, MD; Chaplain: Hadley Pen, Chaplain; Registered Nurse: Kennyth Lose, RN; Registered Nurse: Benny Lennert, RN; Physical Therapy Assistant: Diego Cory, PTA; Speech Language Pathologist: Narda Rutherford, [place]  SURGERY 12/03/2015  POST-OPERATIVE DIAGNOSIS:    Peritonitis Jejunal disruption Gastric leak  PROCEDURE:    LAPAROSCOPY DIAGNOSTIC OMENTOPEXY of jejunal disruption [place] OUT x 21L with drains placement  SURGEON:  Michael Boston, MD   Post-Op 12/01/2015  POST-OPERATIVE DIAGNOSIS:    RECURRENT GASTRIC OUTLET OBSTRUCTION DUE to significant edema at gastroduodenal anastomosis. GASTRITIS Failure to thrive with malnutrition  PROCEDURE:   LAPAROSCOPIC PLACEMENT OF FEEDING JEJUNOSTOMY AND GASTROSTOMY TUBEs ESOPHAGOGASTRODUODENOSCOPY (EGD) BALLOON DILATION OF DUODENAL STRICTURE Gastric biopsies  Closure of gastrotomy  SURGEON:  Michael Boston, MD  Prior surgery 09/25/2015  POST-OPERATIVE DIAGNOSIS:  Partial gastric outlet obstruction due to chronic pyloric stricture  PROCEDURE:   XI ROBOTIC distal gastrectomy BILROTH I  ANASTOMOSIS  ANTERIOR AND POSTERIOR VAGOTOMY DOR (Anterior 259 degree) FUNDIPLICATION OMENTOPEXY UPPER ENDOSCOPY  SURGEON:  Michael Boston, MD   Problem List:   Principal  Problem:   Bile peritonitis due to gastric tube dislodgement Active Problems:   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 09/25/2015   Pneumatosis of intestines   Essential hypertension, benign   Hyperlipidemia   Anxiety state   Gastric outlet obstruction   Gastric Ileus    Encephalopathy acute   Endotracheally intubated   Protein-calorie malnutrition, severe   Acute respiratory failure with hypoxemia (HCC)   Tobacco abuse   Ischemic right index finger at tip   Gastritis   Gastric tube present (LUQ)   Jejunostomy tube present (LLQ)    Assessment/Plan:  GUARDED but recovering   OK so far on floor.  RNs hopeful he is more calm & needs less intense care.  Return to SDU if fails  Adv TF's via J tube.  CT scan if cannot tolerate or worsens to r/o worsening pneumatosis/hematoma/abscess/SBO.  Abd binder to cover tubes.    G tube to LIWS.  Gravity if drainage low/controlled with TFs at goal.  Site care.  Hopefully as malnutrition & ileus resolve, will get easier.  IV nutrition TNA for severe malnutrition.  Wean when tolerates TFs at goal  Follow off ABx.    Cultures negative = No more peritoneal drains.  Hopefully oiutside window of developing new intraperitoneal abscess  Sedation/Anxiolysis with confusion - challenge with pt that goes from sleeping to pulling at lines/tubes.  More calm a hopeful sign with Haldol, but guarded w pulling out tubes x3 this admission.  Mobilize & get more active.  Sundowning precautions.    Off vent # days a hopeful sign  Speech therapy reevaluate to rule out aspiration and see if he can try doing some drinking up to a pureed diet with the G-tube in place.  Deconditioned = guarded with PO attempts.  MBS eval in future when stronger (maybe with trach downsized).  Follow &  replace electrolytes (esp K) as needed  D/c foley if can get up more  Able to pass endoscopy tube across the gastroduodenal Bilroth anastomosis signs the  gastric outlet obstruction from inflammation is improving.  Inflammatory narrowing at gastroduodenal Bilroth I anastomosis.  The fact that there is bilious reflux into the stomach argues against a complete obstruction.  Skin care.   F/u Bx's for Hpylori neg.  Carafate & PPI via Gtube as tolerated.    Pneumatosis & jejunal bowel wall thickening.  Lactate <2.   No SMA thrombosis.  No evidence of any embolic disease in the mesentery.  Off pressors.  WBC stable Suspect this is source of GL bleeding (NGT & Gtube bilious, not bloody.  EGD without bleeding in stomach/duodenum.   Jejunostomy tube replaced.  No perforation.  No extravasation of contrast around jejunostomy tube.   BMs no longer bloody.  Hold lovenox if Hgb <8.  CT scan if cannot tolerates TFs.  If becomes complete SBO, may need SBR - try to hold off  I would not operate on this patient unless he goes under septic shock with high lactate, severe distention and peritonitis.  Extremely hostile abdomen with high risk of enterocutaneous fistula and breakdown of any small bowel resection.  Any bowel obstruction would be related to inflammation and adhesions and an early inflammatory stage.  Would manage conservatively anyway.  Having BMs = not complete SBO.  Continue proximal decompression with orogastric active LIWS suction, gastric tube to gravity (to keep pressure down but not actively suck around Gtube skin leak), jejunostomy tube back to gravity.  VTE prophylaxis - SCDs.  Hold off on enoxaparin if Hgb<8 s or persistent bleeding.   Seems like the GI bleeding has resolved.  Brachial artery embolus to right index finger resolved.   D/w Dr. Trula Slade with vascular surgery.   Duplex study argues against any more brachial clot with good blood supply.  Finger tip improving  Nicotene patch   Disposition: He will definitetly require skilled care facility with rehabilitation capacity upon discharge from the hospital.  I suspect it's going to take at least a  few weeks before he'll be ready for that transition.    I discussed operative findings, updated the patient's status, discussed probable steps to recovery, and gave postoperative recommendations to the patient and family. (daughter).    ICU RNs.  Discussion with critical-care nursing staff.  Recommendations were made.  Questions were answered.  They expressed understanding & appreciation.    [name], M.D., F.A.C.S. Gastrointestinal and Minimally Invasive Surgery [facility] [address] [place], [place] [address] [phone] Main / Paging    12/22/2015   Subjective:  At floor bed near RN station. Talking a little around trach Denies much pain    Objective:  Vital signs:  Vitals:   12/21/15 2303 12/22/15 0014 12/22/15 0439 12/22/15 0500  BP:  (!) 129/54  (!) 131/48  Pulse: 96 (!) 106  100  Resp: 20 18  18   Temp:  98.4 F (36.9 C)  98.3 F (36.8 C)  TempSrc:  Oral  Oral  SpO2: 98% 98%  96%  Weight:   80.7 kg (177 lb 14.6 oz)   Height:        Last BM Date: 12/21/15  Intake/Output   Yesterday:  11/06 0701 - 11/07 0700 In: 1818.2 [I.V.:1441.5; NG/GT:306.7] Out: 2101 [Urine:1900; Drains:200; Stool:1] This shift:  Total I/O In: 1097.2 [I.V.:790.5; NG/GT:306.7] Out: 801 [Urine:800; Stool:1]  Bowel function:  Flatus: YES  BM:  YES  Gtube: thin light bile - no blood.  Leaking around skin not more inflamed Jtube site: cleaner  Physical Exam:  General: Pt awake. Less confused.  Tired.  No acute distress.  Interacting with RNs Eyes: PERRL, normal EOM.  Sclera clear.  No icterus Neuro: CN II-XII intact w/o focal sensory/motor deficits. Lymph: No head/neck/groin lymphadenopathy Psych:  No delerium/psychosis/paranoia HENT: Normocephalic, Mucus membranes moist.  No thrush. ETT in place. Neck: Supple, No tracheal deviation Chest: CTAB. Chest wall pain w good excursion CV:  Pulses intact.  Regular rhythm MS: Normal  AROM mjr joints.  No obvious deformity Abdomen:  G tube LUQ.  Rash around skin fair with drainage  J tube in LLQ - skin irritated but stable  Edema left flank.   Softer.  Nondistended.  Tenderness at LLQ minimal.  No guarding.  No pain elsewhere.   GU:  Foley in place.  NEMG Ext:  SCDs BLE.  No mjr edema.  Right index finger with minimal cyanosis.  Good cap refill to nail bed.  PICC LUE clean Skin: 1+ anasarca.  No other petechiae / purpura  Results:   Diagnosis Stomach, biopsy - CHRONIC FOCALLY ACTIVE GASTRITIS. - THERE IS NO EVIDENCE OF HELICOBACTER PYLORI, DYSPLASIA OR MALIGNANCY. - SEE COMMENT. Microscopic Comment A Warthin-Starry stain is negative for the presence of Helicobacter pylori organisms. (JBK:gt, 12/04/15) Enid Cutter MD Pathologist, Electronic Signature (Case signed 12/04/2015) Specimen Mazal Ebey and Clinical Information Specimen(s) Obtained: Stomach, biopsy Specimen Clinical  Labs: Results for orders placed or performed during the hospital encounter of 11/24/15 (from the past 48 hour(s))  Glucose, capillary     Status: Abnormal   Collection Time: 12/20/15  7:56 AM  Result Value Ref Range   Glucose-Capillary 142 (H) 65 - 99 mg/dL   Comment 1 Notify RN    Comment 2 Document in Chart   Glucose, capillary     Status: Abnormal   Collection Time: 12/20/15 11:54 AM  Result Value Ref Range   Glucose-Capillary 120 (H) 65 - 99 mg/dL   Comment 1 Notify RN    Comment 2 Document in Chart   Magnesium     Status: None   Collection Time: 12/20/15  1:55 PM  Result Value Ref Range   Magnesium 2.0 1.7 - 2.4 mg/dL  Glucose, capillary     Status: Abnormal   Collection Time: 12/20/15  3:42 PM  Result Value Ref Range   Glucose-Capillary 125 (H) 65 - 99 mg/dL   Comment 1 Notify RN    Comment 2 Document in Chart   Glucose, capillary     Status: Abnormal   Collection Time: 12/20/15  7:53 PM  Result Value Ref Range   Glucose-Capillary 140 (H) 65 - 99 mg/dL   Comment 1 Document in  Chart   Glucose, capillary     Status: Abnormal   Collection Time: 12/20/15 11:08 PM  Result Value Ref Range   Glucose-Capillary 133 (H) 65 - 99 mg/dL  Glucose, capillary     Status: Abnormal   Collection Time: 12/21/15  3:43 AM  Result Value Ref Range   Glucose-Capillary 138 (H) 65 - 99 mg/dL  Prealbumin     Status: Abnormal   Collection Time: 12/21/15  5:30 AM  Result Value Ref Range   Prealbumin 10.9 (L) 18 - 38 mg/dL    Comment: Performed at [facility]  Magnesium     Status: None   Collection Time: 12/21/15  5:30 AM  Result Value Ref Range   Magnesium 2.0 1.7 - 2.4 mg/dL  Phosphorus     Status: None   Collection Time: 12/21/15  5:30 AM  Result Value Ref Range   Phosphorus 4.3 2.5 - 4.6 mg/dL  CBC     Status: Abnormal   Collection Time: 12/21/15  5:30 AM  Result Value Ref Range   WBC 8.9 4.0 - 10.5 K/uL   RBC 2.91 (L) 4.22 - 5.81 MIL/uL   Hemoglobin 8.4 (L) 13.0 - 17.0 g/dL   HCT 25.9 (L) 39.0 - 52.0 %   MCV 89.0 78.0 - 100.0 fL   MCH 28.9 26.0 - 34.0 pg   MCHC 32.4 30.0 - 36.0 g/dL   RDW 15.2 11.5 - 15.5 %   Platelets 473 (H) 150 - 400 K/uL  Differential     Status: None   Collection Time: 12/21/15  5:30 AM  Result Value Ref Range   Neutrophils Relative % 69 %   Neutro Abs 6.1 1.7 - 7.7 K/uL   Lymphocytes Relative 19 %   Lymphs Abs 1.7 0.7 - 4.0 K/uL   Monocytes Relative 11 %   Monocytes Absolute 1.0 0.1 - 1.0 K/uL   Eosinophils Relative 1 %   Eosinophils Absolute 0.1 0.0 - 0.7 K/uL   Basophils Relative 0 %   Basophils Absolute 0.0 0.0 - 0.1 K/uL  Triglycerides     Status: Abnormal   Collection Time: 12/21/15  5:30 AM  Result Value Ref Range   Triglycerides 199 (H) <150 mg/dL    Comment: Performed at [facility]  Comprehensive metabolic panel     Status: Abnormal   Collection Time: 12/21/15  5:30 AM  Result Value Ref Range   Sodium 147 (H) 135 - 145 mmol/L   Potassium 3.5 3.5 - 5.1 mmol/L   Chloride 115 (H) 101 - 111 mmol/L   CO2 24 22 -  32 mmol/L   Glucose, Bld 118 (H) 65 - 99 mg/dL   BUN 40 (H) 6 - 20 mg/dL   Creatinine, Ser 1.06 0.61 - 1.24 mg/dL   Calcium 8.4 (L) 8.9 - 10.3 mg/dL   Total Protein 6.1 (L) 6.5 - 8.1 g/dL   Albumin 1.6 (L) 3.5 - 5.0 g/dL   AST 31 15 - 41 U/L   ALT 28 17 - 63 U/L   Alkaline Phosphatase 212 (H) 38 - 126 U/L   Total Bilirubin 1.4 (H) 0.3 - 1.2 mg/dL   GFR calc non Af Amer >60 >60 mL/min   GFR calc Af Amer >60 >60 mL/min    Comment: (NOTE) The eGFR has been calculated using the CKD EPI equation. This calculation has not been validated in all clinical situations. eGFR's persistently <60 mL/min signify possible Chronic Kidney Disease.    Anion gap 8 5 - 15  Glucose, capillary     Status: Abnormal   Collection Time: 12/21/15  7:37 AM  Result Value Ref Range   Glucose-Capillary 134 (H) 65 - 99 mg/dL  Glucose, capillary     Status: Abnormal   Collection Time: 12/21/15 11:29 AM  Result Value Ref Range   Glucose-Capillary 125 (H) 65 - 99 mg/dL  Glucose, capillary     Status: Abnormal   Collection Time: 12/21/15  4:16 PM  Result Value Ref Range   Glucose-Capillary 116 (H) 65 - 99 mg/dL  Glucose, capillary     Status: Abnormal   Collection Time: 12/21/15  7:50 PM  Result Value Ref  Range   Glucose-Capillary 140 (H) 65 - 99 mg/dL  Glucose, capillary     Status: Abnormal   Collection Time: 12/21/15 11:46 PM  Result Value Ref Range   Glucose-Capillary 131 (H) 65 - 99 mg/dL  Glucose, capillary     Status: Abnormal   Collection Time: 12/22/15  4:03 AM  Result Value Ref Range   Glucose-Capillary 138 (H) 65 - 99 mg/dL  Basic metabolic panel     Status: Abnormal   Collection Time: 12/22/15  5:05 AM  Result Value Ref Range   Sodium 148 (H) 135 - 145 mmol/L   Potassium 3.3 (L) 3.5 - 5.1 mmol/L   Chloride 117 (H) 101 - 111 mmol/L   CO2 23 22 - 32 mmol/L   Glucose, Bld 146 (H) 65 - 99 mg/dL   BUN 47 (H) 6 - 20 mg/dL   Creatinine, Ser 0.95 0.61 - 1.24 mg/dL   Calcium 8.1 (L) 8.9 - 10.3  mg/dL   GFR calc non Af Amer >60 >60 mL/min   GFR calc Af Amer >60 >60 mL/min    Comment: (NOTE) The eGFR has been calculated using the CKD EPI equation. This calculation has not been validated in all clinical situations. eGFR's persistently <60 mL/min signify possible Chronic Kidney Disease.    Anion gap 8 5 - 15    Imaging / Studies: No results found.  Medications / Allergies: per chart  Antibiotics: Anti-infectives    Start     Dose/Rate Route Frequency Ordered Stop   12/09/15 1200  anidulafungin (ERAXIS) 100 mg in sodium chloride 0.9 % 100 mL IVPB     100 mg over 90 Minutes Intravenous Every 24 hours 12/09/15 0918 12/10/15 1333   12/05/15 1200  anidulafungin (ERAXIS) 100 mg in sodium chloride 0.9 % 100 mL IVPB  Status:  Discontinued     100 mg over 90 Minutes Intravenous Every 24 hours 12/04/15 1045 12/09/15 0835   12/04/15 1200  anidulafungin (ERAXIS) 200 mg in sodium chloride 0.9 % 200 mL IVPB     200 mg over 180 Minutes Intravenous  Once 12/04/15 1045 12/04/15 1725   12/04/15 0600  cefoTEtan (CEFOTAN) 2 g in dextrose 5 % 50 mL IVPB     2 g 100 mL/hr over 30 Minutes Intravenous On call to O.R. 12/03/15 1821 12/03/15 2007   12/04/15 0400  piperacillin-tazobactam (ZOSYN) IVPB 3.375 g  Status:  Discontinued     3.375 g 12.5 mL/hr over 240 Minutes Intravenous Every 8 hours 12/03/15 2229 12/15/15 0751   12/02/15 0000  erythromycin ethylsuccinate (EES) 200 MG/5ML suspension 400 mg  Status:  Discontinued    Comments:  Administer via J tube (16Fr LLQ)   400 mg Oral Every 6 hours 12/01/15 2020 12/03/15 0748   11/27/15 1200  erythromycin 500 mg in sodium chloride 0.9 % 100 mL IVPB     500 mg 100 mL/hr over 60 Minutes Intravenous Every 6 hours 11/27/15 0940 11/29/15 0608   11/26/15 0830  erythromycin 250 mg in sodium chloride 0.9 % 100 mL IVPB  Status:  Discontinued     250 mg 100 mL/hr over 60 Minutes Intravenous Every 6 hours 11/26/15 0751 11/27/15 0940         Note: Portions of this report may have been transcribed using voice recognition software. Every effort was made to ensure accuracy; however, inadvertent computerized transcription errors may be present.   Any transcriptional errors that result from this process are unintentional.    Remo Lipps  C. Johney Maine, M.D., F.A.C.S. Gastrointestinal and Minimally Invasive Surgery [facility] [address], [place], [place] [address] [phone] Main / Paging     12/22/2015

## 2015-12-22 NOTE — Progress Notes (Signed)
Nutrition Follow-up  DOCUMENTATION CODES:   Severe malnutrition in context of acute illness/injury  INTERVENTION:  Continue Vital 1.2 @ 30 ml/hr with free water flushes of 20 ml Q4hrs. This regimen provides 864 kcal, 54 grams protein, 703 ml H2O. Advancement of TF per Surgery (Dr. Johney Maine).  Continue TPN per Pharmacy. Reducing today to Clinimix E 5/20 @ 40 ml/hr + 20% ILE @ 10 ml/hr.  RD will continue to follow.  NUTRITION DIAGNOSIS:   Inadequate oral intake related to inability to eat as evidenced by NPO status.  Ongoing.  GOAL:   Patient will meet greater than or equal to 90% of their needs  Met with TF and TPN regimen.  MONITOR:   TF tolerance, Weight trends, Labs, Skin, I & O's, Other (Comment) (TPN regimen)  REASON FOR ASSESSMENT:   Consult Enteral/tube feeding initiation and management  ASSESSMENT:   76 year old male presenting for a post-operative visit. Note for "Post-Operative": Patient returns one month status post robotic resection.  Distal gastrectomy with Billroth I gastroduodenal reconstruction for chronic ulcer causing obstruction.  09/25/2015  -Dr. Johney Maine advanced TF via J-tube to Vital 1.2 @ 30 ml/hr today. Per his note, wean TPN when patient tolerates TF at goal. -Patient continues with G-tube to LIWS (200 ml output past 24 hrs). Per Dr. Johney Maine, if drainage low/controlled with Tfs at goal can go to gravity. -Per SLP evaluation yesterday, patient to remain NPO for now with plan to follow-up with MBS evaluation with patient stronger.   Spoke with wife and RN at bedside. Patient resting and did not participate in conversation. Wife reports patient tolerated Vital 1.2 @ 20 ml/hr yesterday - denied N/V or abdominal pain. Also denies any constipation/diarrhea - patient had 2 bowel movements yesterday.   TF: Vital 1.2 @ 30 ml/hr via J-tube with free water flushes of 20 ml Q4hrs. This regimen provides 864 kcal, 54 grams protein, 703 ml H2O.  Goal TF rate is Vital 1.2  @ 65 ml/hr, which will provide 1872 kcal, 117 grams protein, 12 65 ml H2O.  TPN: Currently Clinimix E 5/15 @ 83 ml/hr + 20% ILE @ 10 ml/hr. This regimen provides 1897 kcal, 100 grams protein, 1992 ml daily.  Per pharmacy note today plan to reduce Clinimix to E 5/20 @ 40 ml/hr today and continue 20% IlE @ 10 ml/hr. This regimen will provide 1325 kcal, 48 grams protein, and 960 ml H2O.  Medications reviewed and include: Novolog sliding scale Q6hrs, pantoprazole, potassium chloride 40 mEq once today.  Labs reviewed: CBG 102-140 past 24 hrs, Sodium 148, Potassium 3.3, Chloride 117, BUN 47.   Weight trend: 80.7 kg (-2.2 kg from yesterday)  Diet Order:  Diet NPO time specified Except for: Ice Chips TPN (CLINIMIX-E) Adult TPN (CLINIMIX-E) Adult  Skin:  Wound (see comment) (Abdominal incision)  Last BM:  12/21/2015  Height:   Ht Readings from Last 1 Encounters:  12/08/15 6' 2"  (1.88 m)    Weight:   Wt Readings from Last 1 Encounters:  12/22/15 177 lb 14.6 oz (80.7 kg)    Ideal Body Weight:  80.9 kg  BMI:  Body mass index is 22.84 kg/m.  Estimated Nutritional Needs:   Kcal:  1840-2090 (22-25 kcal/kg)  Protein:  100-117 grams (1.2-1.4 grams/kg)  Fluid:  per MD/NP/Surgery  EDUCATION NEEDS:   No education needs identified at this time  Danny Blade, MS, RD, LDN Pager: [phone] After Hours Pager: [phone]

## 2015-12-22 NOTE — Progress Notes (Signed)
[facility] NOTE   Pharmacy Consult for TPN  Patient Measurements: Height: 6' 2"  (188 cm) Weight: 177 lb 14.6 oz (80.7 kg) IBW/kg (Calculated) : 82.2 TPN AdjBW (KG): 97.2 Body mass index is 22.84 kg/m.  Insulin Requirements: 5 units Novolog yesterday  Current Nutrition: NPO, TPN  IVF: None  Central access: PICC placed 10/12, pt pulled out 10/19. CVC in place since 10/19 TPN date: 10/12-10/19, resumed 10/25  ASSESSMENT                                                                                                          HPI: 89 yoM admitted 10/9 with new upper abdominal discomfort and bloating with concern for recurrent GOO due to chronic pyloric stricture. 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  Pharmacy consulted to start TPN for gastric ileus and obstruction 10/12. Patient underwent G and J tube placement this admission. On 10/19, patient pulled out PICC line. He also had to go back to OR after he removed his G tube accidentally, leading to peritonitis, intubation, and septic shock. Tube feed rate slowly advanced by CCS, but today patient noted to have dark brown output around J tube site, and tube feeds held. Pharmacy asked to resume TPN.   Significant Events:  10/19: Pulled G-tube leading to peritonitis, intubation, septic shock 10/30: EGD - esophagitis, edema/erythematous stomach, stenosed Billroth I gastroduodenostomy,  Gastric pouch w/ ulceration, gastroduodenal anastomosis with ulceration, non-bleeding ulcers found in duodenum and jejunum.  10/31 and 11/1: peritoneal drains removed by IR 11/3: Pulled out J tube, replaced by surgery. TFs on hold 11/6: Retry trophic TFs via J tube 11/7: advancing TFs to 30 ml/hr today (goal 65 ml/hr) - provides 54 g protein and 864 calories  Today, 12/22/15:   Glucose - No hx DM noted. CBGs at goal <150.  Electrolytes - Na and Cl- high  (stable): Clinimix contains 35 meq/L of Na+ so not likely contributing much to elevated Na, all other lytes including Corrected Ca WNL  Renal - AKI -resolved; UOP adequate  I/O: -5L since admit; small BM  LFTs - AST/ALT WNL, T.Bili improving, Alk phos rising  TGs -76 (10/13), 97 (10/16), 214 (10/26), 194 (10/30), 199 (11/6)  Prealbumin - 17.8 (10/11), 12.8 (10/13), 11 (10/16), 12.5 (10/18), < 5 (10/23), 6.4 (10/26), 9.2 (10/30), 10.9 (11/6)  NUTRITIONAL GOALS                                                                                             RD recs:  Kcal:1840-2090 kcal/kg, Protein: 100-117 g, Fluids: per MD/NP/surgery  Clinimix 5/15 at a goal rate  of 61m/hr would provide 100g/day protein, 1894 Kcal/day.  **There is currently a nPsychologist, prison and probation servicesof Clinimix solution. To conserve supply, will keep goal rate of Clinimix 5/15 at 83 ml/hr + 20% fat emulsion at 148mhr  PLAN                                                                                           KCl 40 mEq PO liquid x 1   At 1800 today:  Reduce Clinimix to E 5/20 at 40 ml/hr as will get ~50% of nutrition from TFs today and appears to be tolerating (Clinimix will provide 48 g protein and   Continue electrolytes in TPN despite mild hypernatremia.    20% fat emulsion at 10 ml/hr.  TPN to contain standard multivitamins and trace elements.  IVF per MD.  Continue sensitive SSI q4h - liberalize to q6h  BMP tomorrow; TPN lab panels on Mondays & Thursdays.    F/u if pt tolerates TFs, wean TPN when TFs near goal.    Stopped IV pepcid as pt also on PPI PT   F/u daily  DrReuel BoomPharmD, BCPS Pager: [phone]/08/2015, 10:53 AM

## 2015-12-22 NOTE — Progress Notes (Signed)
Physical Therapy Treatment Patient Details Name: [name] MRN: [id number] DOB: 12-12-1940 Today's Date: 12/22/2015    History of Present Illness 75 y/o male with peptic ulcer disease admitted on 10/10 with recurrent gastric outlet obstruction secondary to gastritis admitted underwent G and J tube placement this admission.  On 10/19 had to go back to the OR after he removed his G tube accidentally leading to peritonitis, intubation, septic shock. Rt index finger ischemia likely from Rt brachial a line. Traccheostomy 12/11/15. Trial  off of vent /on Trach collar 10/30 until trip to endo then back on vent..  PT initial evaluation 12/03/15 then discontinued di=ue to VDRF.    PT Comments    Patient was seen in bed upon arrival with family present. Performed supine to sit x maxA (patient assisted 25%). VC's to scoot LE's over in bed (assisted 50%), but required maxA to raise trunk and shift hips around in bed. Sit to stand x modA x VC's for safe hand placement on the platform walker and to scoot feet underneath to improve BOS. Gait x 3 ft with VC's to maintain trunk extension to improve posture and to increase step length. Vitals: pre-treatment: HR 97 bpm, BP 124/56, 100% O2; following sit to stand HR 111 bpm, BP 146/77, 99% O2; post-treatment HR 109 bpm, BP 156/71, 99% O2. Patient is on 7L of O2. Patient has decreased activity tolerance due to decreased endurance, weakness, and fatigue.  Nursing notified that patient needs lift to return to bed.   Follow Up Recommendations  SNF;LTACH;Supervision/Assistance - 24 hour     Equipment Recommendations  None recommended by PT    Recommendations for Other Services       Precautions / Restrictions Precautions Precautions: Fall Precaution Comments:  J tube, G tube, PEG. multiple lines, trached, R UE thrombus with index finger embolic event. very weak legs Restrictions Weight Bearing Restrictions: No    Mobility  Bed Mobility Overal bed  mobility: Needs Assistance Bed Mobility: Supine to Sit     Supine to sit: Max assist;+2 for physical assistance;+2 for safety/equipment (pt. assisted 25%)     General bed mobility comments: VC's to move LE's over in bed (pt. assisted 50%). MaxA (pt assist 25%) to support LE, raise trunk and rotate hips in bed.   Transfers Overall transfer level: Needs assistance Equipment used: Bilateral platform walker   Sit to Stand: Mod assist;+2 physical assistance;From elevated surface         General transfer comment: Required elevated surface to perform STS and verbal/tactile cuing for safe hand placement on platform walker before standing.   Ambulation/Gait Ambulation/Gait assistance: Mod assist  Ambulation Distance (feet): 3   Assistive device: Bilateral platform walker Gait Pattern/deviations: Step-through pattern;Decreased step length - right;Decreased step length - left;Decreased stride length;Trunk flexed;Narrow base of support Gait velocity: decreased Gait velocity interpretation: Below normal speed for age/gender General Gait Details: VC's required to promote trunk extension to improve posture and to increase step length.   Stairs            Wheelchair Mobility    Modified Rankin (Stroke Patients Only)       Balance                                    Cognition Arousal/Alertness: Awake/alert Behavior During Therapy: WFL for tasks assessed/performed Overall Cognitive Status: Within Functional Limits for tasks assessed  Exercises      General Comments        Pertinent Vitals/Pain Pain Assessment: No/denies pain Pain Intervention(s): Limited activity within patient's tolerance;Monitored during session;Repositioned    Home Living                      Prior Function            PT Goals (current goals can now be found in the care plan section) Progress towards PT goals: Progressing toward goals     Frequency    Min 3X/week      PT Plan Current plan remains appropriate    Co-evaluation             End of Session Equipment Utilized During Treatment: Oxygen;Gait belt Activity Tolerance: Patient tolerated treatment well;Patient limited by fatigue Patient left: in chair;with call bell/phone within reach;with chair alarm set     Time: 1415-1445 PT Time Calculation (min) (ACUTE ONLY): 30 min  Charges:  $Gait Training: 8-22 mins $Therapeutic Activity: 8-22 mins                    G CodesMarcelino Scot:      Caitlin Medlin, SPTA [facility] Acute Rehab [phone]  Present and agree with above   Felecia ShellingLori Renly Roots  PTA [facility]  Acute  Rehab Pager      [phone]

## 2015-12-22 NOTE — Care Management Important Message (Signed)
Important Message  Patient Details  Name: [name] MRN: [id number] Date of Birth: [birth year]   Medicare Important Message Given:  Yes    Haskell FlirtJamison, Tinisha Etzkorn 12/22/2015, 9:12 AMImportant Message  Patient Details  Name: [name] MRN: [id number] Date of Birth: [birth year]   Medicare Important Message Given:  Yes    Haskell FlirtJamison, Ryleigh Buenger 12/22/2015, 9:12 AM

## 2015-12-22 NOTE — Progress Notes (Signed)
PCCM PROGRESS NOTE  Admission date: 11/24/2015 Consult date: 12/03/2015 Referring provider: Dr. Michaell CowingGross  CC: Abdominal pain  BRIEF:  75 y/o male with peptic ulcer disease admitted on 10/10 with recurrent gastric outlet obstruction secondary to gastritis admitted underwent G and J tube placement this admission.  On 10/19 had to go back to the OR after he removed his G tube accidentally leading to peritonitis, intubation, septic shock.  Subjective:  afebrile Resting on 4l ATC -satn 100% tolerating PMV under supervision    Vital signs: BP (!) 131/48 (BP Location: Right Arm)   Pulse 100   Temp 98.3 F (36.8 C) (Oral)   Resp 18   Ht 6\' 2"  (1.88 m)   Wt 177 lb 14.6 oz (80.7 kg)   SpO2 96%   BMI 22.84 kg/m   Intake/output: I/O last 3 completed shifts: In: 3456.2 [I.V.:2929.5; Other:70; NG/GT:406.7; IV Piggyback:50] Out: 2786 [Urine:2360; Drains:425; Stool:1]   : FiO2 (%):  [28 %] 28 %  General:chronically ill appearing  HENT: NCAT Tracheostomy clean, dry, intact, PMV in place, weak but audible voice.  PULM: CTA B, normal effort CV: RRR, no mgr GI: BS+, G and J  in place Derm: no rash, mild edema unchanged Neuro: awake, answers questions, globally weak but non-focal  CMP Latest Ref Rng & Units 12/22/2015 12/21/2015 12/20/2015  Glucose 65 - 99 mg/dL 409(W146(H) 119(J118(H) 478(G142(H)  BUN 6 - 20 mg/dL 95(A47(H) 21(H40(H) 08(M43(H)  Creatinine 0.61 - 1.24 mg/dL 5.780.95 4.691.06 6.291.03  Sodium 135 - 145 mmol/L 148(H) 147(H) 148(H)  Potassium 3.5 - 5.1 mmol/L 3.3(L) 3.5 3.6  Chloride 101 - 111 mmol/L 117(H) 115(H) 116(H)  CO2 22 - 32 mmol/L 23 24 25   Calcium 8.9 - 10.3 mg/dL 8.1(L) 8.4(L) 8.3(L)  Total Protein 6.5 - 8.1 g/dL - 6.1(L) -  Total Bilirubin 0.3 - 1.2 mg/dL - 5.2(W1.4(H) -  Alkaline Phos 38 - 126 U/L - 212(H) -  AST 15 - 41 U/L - 31 -  ALT 17 - 63 U/L - 28 -    CBC Latest Ref Rng & Units 12/21/2015 12/18/2015 12/15/2015  WBC 4.0 - 10.5 K/uL 8.9 10.6(H) 10.2  Hemoglobin 13.0 - 17.0 g/dL 4.1(L8.4(L) 2.4(M8.6(L)  0.1(U8.8(L)  Hematocrit 39.0 - 52.0 % 25.9(L) 26.0(L) 26.4(L)  Platelets 150 - 400 K/uL 473(H) 573(H) 633(H)   ABG    Component Value Date/Time   PHART 7.465 (H) 12/18/2015 1030   PCO2ART 32.8 12/18/2015 1030   PO2ART 76.2 (L) 12/18/2015 1030   HCO3 23.3 12/18/2015 1030   TCO2 26 12/04/2007 0638   ACIDBASEDEF 7.6 (H) 12/07/2015 2230   O2SAT 95.1 12/18/2015 1030   CBG (last 3)   Recent Labs  12/21/15 2346 12/22/15 0403 12/22/15 0803  GLUCAP 131* 138* 124*   Imaging: No results found.PCXR w/ minimal L base atx. Aeration improved. Trach good position.   Studies: 10/11 CT abdomen/pelvis> significant distension of the stomach with layering fluid and gas, NG present, worrisome for recurrent gastric outlet obstruction.  RLL consolidation, cholecycstectomy 10/26 CT abdomen/pelvis> Moderate dilatation of proximal and mid small bowel loops, with transition point in anterior right lower quadrant, suspicious for partial small bowel obstruction. Pneumatosis involving multiple dilated small bowel loops in the left abdomen pelvis, suspicious for bowel ischemia. No evidence of portal venous gas or free intraperitoneal air. Two left upper quadrant fluid collections adjacent to the stomach and spleen, which may represent postoperative fluid collections or abscesses. Small amount free fluid also noted in pelvis.Small bilateral pleural effusions and  bibasilar atelectasis. Diffuse mesenteric and body wall edema. 11/2 Modified barium swallow> silent aspiration  Cultures: Blood 10/19 >> neg Fluid cx 10/17 >> neg Fluid cx 10/27 >> neg  Antibiotics: Zosyn 10/19 >> off Anidulafungin 10/19 >> 10/26   Events: 10/10  admission 10/17  Lap placement of J & G tubes, EGD w/ balloon dilation of duodenal stricture, gastric biopsies, closure of gastrotomy 10/18  sundowning, pulled out G tube 10/19  laparaoscopy (findings gastric leak, bilious ascites, jejunal disruption), omentoplexy of jejunal disruption,  gastric leak 10/24 extubated. Required re-intubation.  10/25: placed on precedex for acute encephalopathy; new drainage from J tube. tubefeeds stopped.   10/26 Heparin off since this am (as recommended by vascular). Still on pressors. Getting albumin and lasix to a/w mobilizing third spaced fluid/anasarca. Developed rectal bleeding. This has initiated after starting bowel prep. Hgb has drifted from 8 to 7.7 --> stopped [place] heparin. CT found evidence of bowel pneumotosis worrisome for ischemia, and two LUQ fluid collections 10/27: trach placed. TO IR for Perc drains to be placed in LUQ 10/30 EGD : mild esophagitis, evidence of billroth procedure, gastric pouch, non-bleeding duodenal ulcers, non-bleeding jejunal ulcers, could not reach j - tube 10/31: PMSV trial, pt tolerated well 11/1: >24 hours on trach collar; PSMV in place   Lines/tube: 10/19 ETT >> 10/24 then replaced 10/24 > 10/27 10/19 CVL left subclavian >>  10/19 R brachial arterial line >> 10/20 10/19 abdominal drain x3 >> 10/19 G tube >> 10/19 J tube >> 10/27 Trach placed (feinstein # 6 cuffed), Abd drains by IR 10/29 J dislodged, replaced by IR 11/3 early AM pulled out J tube, replaced by surgery  Summary: 75 yo male former smoker adm with gastric outlet obstruction with pyloric channel ulcer.  Had Billroth 1, vagotomy, fundoplication, omentopexy August 2017 with initial improvement.  Had recurrent symptoms September 2017 that become progressively worse with gastric dilation from recurrent stricture.  Had laparoscopic placement of jejunostomy tube, gastrostomy tube, and EGD balloon dilation of duodenal stricture 10/17.  Developed gastric leakage with peritonitis after pulling out PEG tube 10/19 and taken to OR. On 10/27 found 2 new LUQ abscesses/fluid collections which were drained percutaneously by IR. Had perc trach 10/27.     PMHx of CAD s/p stent, GERD, HTN, HLD  Assessment/plan:  Acute and chronic respiratory failure   Tracheostomy status.  - tracheostomy collar O2 supplementation to maintain O2 > 90% - PMSV in place when supervised - Cont routine trach care  - change to cuffless trach by 11/10 - would consider decannulation only when he is ambulatory  Recurrent gastric outlet obstruction GI bleeding> source inflamed small bowel? > resolved Abdominal abscess/fluid collections s/p IR drains Protein-calorie malnutrition Dislodged J overnight 11/3 > replaced - G and J tube management per surgery - nutrition per surgery, trickle feeds started - TPN per surgery - monitoring off antibiotics - SLP eval ongoing, MBS deferred  Hypertension with Sinus tachycardia - low dose metoprolol  AKI >> resolved Hypernatremia  Hyperchloremia  hypokalemia - replace electrolytes as needed  Acute metabolic encephalopathy, improved 11/4 Post-op pain Severe Physical deconditioning  - change to PRN moprhine for pain, can add tylenol #3 - haldol prn for severe agitation  - continue scheduled haldol, monitor QTc on tele monitor  Rt index finger ischemia likely from Rt brachial a line. > improved, no clot on upper ext doppler ultrasound, good pulse - VVS consulted 10/20, appreciate input-->resolved  - no more Arterial lines right arm  Hyperglycemia -  SSI   SUP - Famotidine IV DVT prophylaxis - SCDs Goals of care - Full code  Updated daughter, PCCM will ct to follow for trach care  [name] Mourning MD. [facility]. [facility] Pager [phone] If no response call [phone]     12/22/2015, 10:20 AM

## 2015-12-23 LAB — GLUCOSE, CAPILLARY
GLUCOSE-CAPILLARY: 122 mg/dL — AB (ref 65–99)
GLUCOSE-CAPILLARY: 120 mg/dL — AB (ref 65–99)
Glucose-Capillary: 126 mg/dL — ABNORMAL HIGH (ref 65–99)
Glucose-Capillary: 131 mg/dL — ABNORMAL HIGH (ref 65–99)

## 2015-12-23 LAB — BASIC METABOLIC PANEL
Anion gap: 6 (ref 5–15)
BUN: 48 mg/dL — AB (ref 6–20)
CHLORIDE: 120 mmol/L — AB (ref 101–111)
CO2: 23 mmol/L (ref 22–32)
CREATININE: 0.92 mg/dL (ref 0.61–1.24)
Calcium: 7.8 mg/dL — ABNORMAL LOW (ref 8.9–10.3)
GFR calc Af Amer: >60 mL/min (ref >60)
GFR calc non Af Amer: >60 mL/min (ref >60)
GLUCOSE: 129 mg/dL — AB (ref 65–99)
Potassium: 3.8 mmol/L (ref 3.5–5.1)
Sodium: 149 mmol/L — ABNORMAL HIGH (ref 135–145)

## 2015-12-23 MED ORDER — ALUM & MAG HYDROXIDE-SIMETH 200-200-20 MG/5ML PO SUSP: 30.0000 mL | Freq: Four times a day (QID) | ORAL | Status: DC | PRN

## 2015-12-23 MED ORDER — IRBESARTAN 150 MG PO TABS: 75.0000 mg | ORAL_TABLET | Freq: Every day | ORAL | Status: DC

## 2015-12-23 MED ORDER — HALOPERIDOL LACTATE 5 MG/ML IJ SOLN: 1.0000 mg | Freq: Every evening | INTRAMUSCULAR | Status: DC | PRN

## 2015-12-23 MED ORDER — IRBESARTAN 150 MG PO TABS
75.0000 mg | ORAL_TABLET | Freq: Every day | ORAL | Status: DC
  Administered 2015-12-23 – 2015-12-24 (×2): 75 mg
  Filled 2015-12-23 (×2): qty 1

## 2015-12-23 MED ORDER — MAGIC MOUTHWASH
15.0000 mL | Freq: Four times a day (QID) | ORAL | Status: DC | PRN
  Filled 2015-12-23 (×2): qty 15

## 2015-12-23 MED ORDER — VITAL AF 1.2 CAL PO LIQD
1000.0000 mL | ORAL | Status: DC
  Administered 2015-12-23 – 2015-12-28 (×7): 1000 mL
  Filled 2015-12-23 (×7): qty 1000

## 2015-12-23 MED ORDER — HALOPERIDOL LACTATE 5 MG/ML IJ SOLN
1.0000 mg | Freq: Three times a day (TID) | INTRAMUSCULAR | Status: DC | PRN
  Administered 2015-12-24: 1 mg via INTRAVENOUS
  Filled 2015-12-23: qty 1

## 2015-12-23 MED ORDER — VITAL AF 1.2 CAL PO LIQD: 1000.0000 mL | ORAL | Status: DC

## 2015-12-23 MED ORDER — HALOPERIDOL LACTATE 5 MG/ML IJ SOLN: 1.0000 mg | Freq: Three times a day (TID) | INTRAMUSCULAR | Status: DC | PRN

## 2015-12-23 MED ORDER — TRAMADOL HCL 50 MG PO TABS
50.0000 mg | ORAL_TABLET | Freq: Four times a day (QID) | ORAL | Status: DC | PRN
  Administered 2015-12-23: 100 mg via ORAL
  Filled 2015-12-23: qty 2

## 2015-12-23 MED ORDER — MORPHINE SULFATE (PF) 2 MG/ML IV SOLN
1.0000 mg | INTRAVENOUS | Status: DC | PRN
  Administered 2015-12-23 (×2): 1 mg via INTRAVENOUS
  Filled 2015-12-23 (×2): qty 1

## 2015-12-23 MED ORDER — TRACE MINERALS CR-CU-MN-SE-ZN 10-1000-500-60 MCG/ML IV SOLN
INTRAVENOUS | Status: DC
  Administered 2015-12-23: 17:00:00 via INTRAVENOUS
  Filled 2015-12-23: qty 960

## 2015-12-23 MED ORDER — FAT EMULSION 20 % IV EMUL
240.0000 mL | INTRAVENOUS | Status: DC
  Administered 2015-12-23: 240 mL via INTRAVENOUS
  Filled 2015-12-23: qty 250

## 2015-12-23 MED ORDER — ACETAMINOPHEN 650 MG RE SUPP
650.0000 mg | Freq: Four times a day (QID) | RECTAL | Status: DC | PRN
  Administered 2015-12-23: 650 mg via RECTAL
  Filled 2015-12-23: qty 1

## 2015-12-23 MED ORDER — PHENOL 1.4 % MT LIQD: 2.0000 | OROMUCOSAL | Status: DC | PRN

## 2015-12-23 MED ORDER — LACTATED RINGERS IV BOLUS (SEPSIS): 1000.0000 mL | Freq: Three times a day (TID) | INTRAVENOUS | Status: AC | PRN

## 2015-12-23 MED ORDER — LIP MEDEX EX OINT
1.0000 "application " | TOPICAL_OINTMENT | Freq: Two times a day (BID) | CUTANEOUS | Status: DC
  Administered 2015-12-23 – 2015-12-24 (×4): 1 via TOPICAL

## 2015-12-23 MED ORDER — MENTHOL 3 MG MT LOZG
1.0000 | LOZENGE | OROMUCOSAL | Status: DC | PRN
Start: 2015-12-23 — End: 2015-12-30

## 2015-12-23 NOTE — Progress Notes (Signed)
Physical Therapy Treatment Patient Details Name: [name] MRN: [id number] DOB: 11/16/1940 Today's Date: 12/23/2015    History of Present Illness 75 y/o male with peptic ulcer disease admitted on 10/10 with recurrent gastric outlet obstruction secondary to gastritis admitted underwent G and J tube placement this admission.  On 10/19 had to go back to the OR after he removed his G tube accidentally leading to peritonitis, intubation, septic shock. Rt index finger ischemia likely from Rt brachial a line. Traccheostomy 12/11/15. Trial  off of vent /on Trach collar 10/30 until trip to endo then back on vent..      PT Comments    Daughter and spouse present during session.  Daughter refused Hoyer pad to be placed in recliner.  Explained for safety and staff to use to return pt back to bed as he may be too fatigued to stand.  Daughter still declined, "he is scared of the lift" and "I don't want them to use it" (lift). Assisted to EOB + 1 assist.  Pt tolerated static sitting x 6 min unsupported.  Assisted with amb twice using B platform EVA walker and + 2 assist.  Very limited activity tolerance due to generalized weakness and extended hospital stay.  avg resting HR 94.  HR increased to high 120's with activity but O2 sats avg 95% on Trach collar 28%  6lts.   Follow Up Recommendations  SNF;LTACH;Supervision/Assistance - 24 hour     Equipment Recommendations  None recommended by PT    Recommendations for Other Services       Precautions / Restrictions Precautions Precautions: Fall Precaution Comments:  J tube, G tube, PEG. multiple lines, trach collar 6 lts 28% very weak legs Restrictions Weight Bearing Restrictions: No    Mobility  Bed Mobility Overal bed mobility: Needs Assistance Bed Mobility: Supine to Sit     Supine to sit: Mod assist;Max assist     General bed mobility comments: demonstarated increased ability to rise with + 1 assist.  Utilized bed pad to complete  scooting.  Demonstarted increased ability to maintain upright sitting balance.  Tolerated EOB x 6 min before showing signs of fatigue.    Transfers Overall transfer level: Needs assistance Equipment used: Bilateral platform walker Transfers: Sit to/from Stand Sit to Stand: Mod assist;+2 physical assistance;From elevated surface;Max assist Stand pivot transfers: Max assist;+2 physical assistance;+2 safety/equipment       General transfer comment: + 2 sisde by side assist to stand with increased ability to self rise rise from elevated bed.  + 2 side by side assist to lower to recliner for control decend.    Ambulation/Gait Ambulation/Gait assistance: +2 safety/equipment;+2 physical assistance;Mod assist;Max assist Ambulation Distance (Feet): 6 Feet Assistive device: Bilateral platform walker Gait Pattern/deviations: Step-to pattern;Decreased step length - left;Decreased step length - right Gait velocity: decreased   General Gait Details: used B platform walker for increased support.  + 3 assist such that recliner is following closely behind.  Very limited amb distance due to generalized weakness and extended hospital stay.   Stairs            Wheelchair Mobility    Modified Rankin (Stroke Patients Only)       Balance   Sitting-balance support: Bilateral upper extremity supported Sitting balance-Leahy Scale: Fair Sitting balance - Comments: needs min guard, occasional min A and cues to support self on EOB when lifting one arm  Cognition Arousal/Alertness: Awake/alert Behavior During Therapy: WFL for tasks assessed/performed Overall Cognitive Status: Within Functional Limits for tasks assessed                      Exercises      General Comments        Pertinent Vitals/Pain Pain Assessment: No/denies pain    Home Living Family/patient expects to be discharged to:: Unsure               Additional Comments: pt  from home with wife. He has a walk in shower and high commode at home    Prior Function            PT Goals (current goals can now be found in the care plan section) Acute Rehab PT Goals Patient Stated Goal: per daughter, to get stronger Progress towards PT goals: Progressing toward goals    Frequency    Min 3X/week      PT Plan Current plan remains appropriate    Co-evaluation     PT goals addressed during session: Mobility/safety with mobility OT goals addressed during session: ADL's and self-care;Strengthening/ROM     End of Session Equipment Utilized During Treatment: Oxygen;Gait belt Activity Tolerance: Treatment limited secondary to medical complications (Comment);Patient limited by fatigue Patient left: in chair;with call bell/phone within reach;with chair alarm set;with family/visitor present     Time: 1420-1455 PT Time Calculation (min) (ACUTE ONLY): 35 min  Charges:  $Gait Training: 8-22 mins $Therapeutic Activity: 8-22 mins                    G Codes:      Felecia ShellingLori Petro [employer]  PTA [facility]  Acute  Rehab Pager      [phone]

## 2015-12-23 NOTE — Progress Notes (Signed)
Nutrition Follow-up  DOCUMENTATION CODES:   Severe malnutrition in context of acute illness/injury  INTERVENTION:  - Continue Vital 1.2 @ 40 mL/hr at this time and increase by 10 mL/hr every 8 hours to reach goal rate of Vital 1.2 @ 70 mL/hr per surgeon order.  - Continue TPN/TPN wean per pharmacy. - RD will follow-up 11/9.  NUTRITION DIAGNOSIS:   Inadequate oral intake related to inability to eat as evidenced by NPO status. -ongoing  GOAL:   Patient will meet greater than or equal to 90% of their needs -met with current TF regimen and TPN regimen.   MONITOR:   TF tolerance, Weight trends, Labs, Skin, I & O's, Other (Comment) (TPN regimen)  ASSESSMENT:   75 year old male presenting for a post-operative visit. Note for "Post-Operative": Patient returns one month status post robotic resection.  Distal gastrectomy with Billroth I gastroduodenal reconstruction for chronic ulcer causing obstruction.  09/25/2015  11/8 Pt continues with trach collar. He is currently receiving Vital 1.2 @ 40 mL/hr via J-tube with order to increase by 10 mL every 8 hours to reach surgeon-appointed TF goal of Vital 1.2 @ 70 mL/hr.  Vital 1.2 @ 50 mL/hr will provide 1440 kcal, 90 grams of protein, and 973 mL free water.  Vital 1.2 @ 70 mL/hr will provide 2016 kcal, 126 grams of protein (108% max estimated protein need), and 1364 mL free water.   At time of visit, pt reports severe back pain. He denies nausea at time of visit or since TF rate increase from 30 mL/hr to 40 mL around 0800 this AM. Pt states that he is having internal abdominal pain but states that this did not occur until staff turned him late AM.   Per Dr. Johney Maine' not this AM, plan to begin using G-tube for medication administration as ileus appears to be resolving. Note indicates that abdominal binder remains in place.  Spoke with Pharmacist who reports plan to maintain TPN regimen from yesterday: Clinimix E 5/15 @ 40 mL/hr with 20% ILE @ 10  mL/hr. This regimen provides 1325 kcal, 48 grams of protein. If medically feasible, recommend d/c TPN tomorrow as pt will be meeting >75% estimated nutrition needs with TF once TF increased to 50 mL/hr.   No new weight today.  Medications reviewed; PRN Maalox, 10 mg Dulcolax/day, sliding scale Novolog, PRN Zofran, 40 mg Protonix BID, 40 mEq KCl x2 doses yesterday, PRN IV Compazine. Labs reviewed; CBG: 122 mg/dL today, Na: 149 mmol/L, Cl: 120 mmol/L, BUN: 48 mg/dL, Ca: 7.8 mg/dL.    11/7 - Dr. Johney Maine advanced TF via J-tube to Vital 1.2 @ 30 ml/hr today. - Per his note, wean TPN when patient tolerates TF at goal. - Patient continues with G-tube to LIWS (200 ml output past 24 hrs).  - Per Dr. Johney Maine, if drainage low/controlled with Tfs at goal can go to gravity. - Per SLP evaluation yesterday, patient to remain NPO for now with plan to follow-up with MBS evaluation with patient stronger.  - Wife reports patient tolerated Vital 1.2 @ 20 ml/hr yesterday - denied N/V or abdominal pain.  - Also denies any constipation/diarrhea - patient had 2 bowel movements yesterday.  - TF: Vital 1.2 @ 30 ml/hr via J-tube with free water flushes of 20 ml Q4hrs.  - This regimen provides 864 kcal, 54 grams protein, 703 ml H2O.  - Goal TF rate is Vital 1.2 @ 65 ml/hr, which will provide 1872 kcal, 117 grams protein, 1265 ml H2O. -  TPN: Currently Clinimix E 5/15 @ 83 ml/hr + 20% ILE @ 10 ml/hr. This regimen provides 1897 kcal, 100 grams protein, 1992 ml daily. - Per pharmacy note today plan to reduce Clinimix to E 5/20 @ 40 ml/hr today and continue 20% IlE @ 10 ml/hr. - This regimen will provide 1325 kcal, 48 grams protein, and 960 ml H2O. - Weight trend: -2.2 kg from yesterday   11/6 - Pt with trach collar.  - Per Dr. Johney Maine' note this AM, he is to transfer to the floor today.  - Also per note, plan to retry trophic TF via J-tube today and plan to begin TPN wean once TF is at goal rate.  - SLP is to reevaluate with  hope for PO intakes to be possible. - Order currently in place for Vital High Protein @ 20 mL/hr.  - Will change to Vital 1.2 @ 20 mL/hr as pt was receiving Vital 1.2 @ 40 mL/hr prior to J-tube replacement last week.  - Goal for TF: Vital 1.2 @ 65 mL/hr which will provide 1872 kcal, 117 grams of protein, and 1265 mL free water.  - Per pharmacy note this AM, plan to continue Clinimix E 5/15 @ 83 mL/hr with 20% ILE @ 10 mL/hr today.  - Weight -2.3 kg since previous assessment.     Diet Order:  Diet NPO time specified Except for: Ice Chips TPN (CLINIMIX-E) Adult TPN (CLINIMIX-E) Adult  Skin:  Wound (see comment) (Abdominal incision)  Last BM:  11/7  Height:   Ht Readings from Last 1 Encounters:  12/08/15 6' 2" (1.88 m)    Weight:   Wt Readings from Last 1 Encounters:  12/22/15 177 lb 14.6 oz (80.7 kg)    Ideal Body Weight:  80.9 kg  BMI:  Body mass index is 22.84 kg/m.  Estimated Nutritional Needs:   Kcal:  1840-2090 (22-25 kcal/kg)  Protein:  100-117 grams (1.2-1.4 grams/kg)  Fluid:  per MD/NP/Surgery  EDUCATION NEEDS:   No education needs identified at this time    Jarome Matin, MS, RD, LDN Inpatient Clinical Dietitian Pager # [phone] After hours/weekend pager # [phone]

## 2015-12-23 NOTE — Clinical Social Work Note (Signed)
Clinical Social Work Assessment  Patient Details  Name: [name] MRN: [id number] Date of Birth: 09-02-1940  Date of referral:  12/23/15               Reason for consult:  Facility Placement, Discharge Planning                Permission sought to share information with:  Family Supports, Case Freight forwarder, Chartered certified accountant granted to share information::  Yes, Verbal Permission Granted  Name::      [name] and [name] )  Agency::   (SNF's who provide trach care)  Relationship::   (Spouse and Daughter )  Contact Information:   [phone] or [phone])  Housing/Transportation Living arrangements for the past 2 months:  [place] of Information:  Adult Children Patient Interpreter Needed:  None Criminal Activity/Legal Involvement Pertinent to Current Situation/Hospitalization:  No - Comment as needed Significant Relationships:  Spouse, Adult Children Lives with:  Spouse Do you feel safe going back to the place where you live?  No Need for family participation in patient care:  Yes (Comment)  Care giving concerns:  Patient from home with spouse and will need SNF placement for ST rehab and nursing care.    Social Worker assessment / plan:  MSW placed call to patient's wife, [name] and left a message in regards to discharge planning. MSW met with patient and pt's dtr, [name] in regards to post-acute placement for SNF. MSW introduced MSW role and SNF process. MSW reviewed and provided SNF list. Dtr is agreeable to SNF placement. MSW also explained barriers and limited options for placement due to tracheostomy care. Pt's dtr expressed understanding. MSW presented two potential facilities that will consider trach. Dtr plans to discuss discharge planning with her mother (pt's spouse). MSW encouraged pt's dtr to contact MSW as needed, contact information provided. No further concerns reported at this time. MSW will continue to follow pt  and family for continued support and to facilitate patient's discharge needs once medically stable.   Employment status:  Retired Nurse, adult PT Recommendations:  [facility], [facility] Information / Referral to community resources:  [place]  Patient/Family's Response to care:  Patient alert and oriented to person only during assessment. Patient's dtr agreeable to SNF placement. Spouse and dtr supportive and strongly involved in pt's care. Dtr polite and appreciated social work intervention.   Patient/Family's Understanding of and Emotional Response to Diagnosis, Current Treatment, and Prognosis:  LOS: 28 Patient's family knowledgeable of medical and surgical intervention over the course of hospitalization. Family strongly involved in care planning. Patient with G tube for draining and tube feeding to be started soon.   Emotional Assessment Appearance:  Appears stated age Attitude/Demeanor/Rapport:  Unable to Assess Affect (typically observed):  Calm Orientation:  Oriented to Self Alcohol / Substance use:  Not Applicable Psych involvement (Current and /or in the community):   No  Discharge Needs  Concerns to be addressed:  Care Coordination Readmission within the last 30 days:  No Current discharge risk:  Dependent with Mobility Barriers to Discharge:  Continued Medical Work up   [employer], MSW [phone] 12/23/2015 10:35 AM

## 2015-12-23 NOTE — Progress Notes (Signed)
[address] [place], [place] [address] Phone: [phone] FAX: [phone]   [name] [id number] 1940/04/28  CARE TEAM:  PCP: Irven Shelling, MD  Outpatient Care Team: Patient Care Team: Lavone Orn, MD as PCP - General (Internal Medicine) Michael Boston, MD as Consulting Physician (General Surgery) Arta Silence, MD as Consulting Physician (Gastroenterology)  Inpatient Treatment Team: Treatment Team: Attending Provider: Michael Boston, MD; Consulting Physician: Arta Silence, MD; Technician: Sueanne Margarita, NT; Consulting Physician: Md Pccm, MD; Rounding Team: Md Pccm, MD; Chaplain: Hadley Pen, Chaplain; Registered Nurse: Kennyth Lose, RN; Registered Nurse: Benny Lennert, RN; Physical Therapy Assistant: Diego Cory, PTA; Technician: Resa Miner, NT; Occupational Therapist: Lesle Chris, OT; Registered Nurse: Arminda Resides, RN  SURGERY 12/03/2015  POST-OPERATIVE DIAGNOSIS:    Peritonitis Jejunal disruption Gastric leak  PROCEDURE:    LAPAROSCOPY DIAGNOSTIC OMENTOPEXY of jejunal disruption [place] OUT x 21L with drains placement  SURGEON:  Michael Boston, MD   Post-Op 12/01/2015  POST-OPERATIVE DIAGNOSIS:    RECURRENT GASTRIC OUTLET OBSTRUCTION DUE to significant edema at gastroduodenal anastomosis. GASTRITIS Failure to thrive with malnutrition  PROCEDURE:   LAPAROSCOPIC PLACEMENT OF FEEDING JEJUNOSTOMY AND GASTROSTOMY TUBEs ESOPHAGOGASTRODUODENOSCOPY (EGD) BALLOON DILATION OF DUODENAL STRICTURE Gastric biopsies  Closure of gastrotomy  SURGEON:  Michael Boston, MD  Prior surgery 09/25/2015  POST-OPERATIVE DIAGNOSIS:  Partial gastric outlet obstruction due to chronic pyloric stricture  PROCEDURE:   XI ROBOTIC distal gastrectomy BILROTH I  ANASTOMOSIS  ANTERIOR AND POSTERIOR VAGOTOMY DOR (Anterior 426 degree) FUNDIPLICATION OMENTOPEXY UPPER  ENDOSCOPY  SURGEON:  Michael Boston, MD   Problem List:   Principal Problem:   Bile peritonitis due to gastric tube dislodgement Active Problems:   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 09/25/2015   Pneumatosis of intestines   Essential hypertension, benign   Hyperlipidemia   Anxiety state   Gastric outlet obstruction   Gastric Ileus    Encephalopathy acute   Endotracheally intubated   Protein-calorie malnutrition, severe   Acute respiratory failure with hypoxemia (HCC)   Tobacco abuse   Ischemic right index finger at tip   Gastritis   Gastric tube present (LUQ)   Jejunostomy tube present (LLQ)    Assessment/Plan:  GUARDED but recovering   OK so far on floor.  RNs hopeful he is more calm & needs less intense care.  Return to SDU if fails  Adv TF's via J tube to goal.  CT scan if cannot tolerate or worsens to r/o worsening pneumatosis/hematoma/abscess/SBO.  Abd binder to cover tubes.    G tube to gravity since lower output & less leaking.  LIWS if worse.  Site care.  Hopefully as malnutrition & ileus resolve, will get easier.  Try to use G tube for meds since ileus seems to be resolving  IV nutrition TNA for severe malnutrition.  Wean when tolerates TFs at goal  Follow off ABx.    Cultures negative = No more peritoneal drains.  Hopefully oiutside window of developing new intraperitoneal abscess  Sedation/Anxiolysis with confusion - challenge with pt that goes from sleeping to pulling at lines/tubes.  More calm a hopeful sign with Haldol, but guarded w pulling out tubes x3 this admission.  Haldol sch QHS only for now w PRN backup.  Mobilize & get more active.  Sundowning precautions.    Off vent # days a hopeful sign  Speech therapy reevaluate to rule out aspiration and see if he  can try doing some drinking up to a pureed diet with the G-tube in place.  Deconditioned = guarded with PO attempts.  MBS eval in future when stronger (maybe with trach  downsized).  Follow & replace electrolytes (esp K) as needed  D/c foley.  Replace if needed  Able to pass endoscopy tube across the gastroduodenal Bilroth anastomosis signs the gastric outlet obstruction from inflammation is improving.  Inflammatory narrowing at gastroduodenal Bilroth I anastomosis.  The fact that there is bilious reflux into the stomach argues against a complete obstruction.  Skin care.   F/u Bx's for Hpylori neg.  Carafate & PPI via Gtube as tolerated.    VTE prophylaxis - SCDs.  Hold off on enoxaparin if Hgb<8 s or persistent bleeding.   Seems like the GI bleeding has resolved.  Brachial artery embolus to right index finger resolved.   D/w Dr. Trula Slade with vascular surgery.   Duplex study argues against any more brachial clot with good blood supply.  Finger tip stable with sensation  Nicotene patch   Disposition: He will definitetly require skilled care facility with rehabilitation capacity upon discharge from the hospital.  I suspect it's going to take at least a few weeks before he'll be ready for that transition.    I discussed operative findings, updated the patient's status, discussed probable steps to recovery, and gave postoperative recommendations to the patient and family. (daughter).    ICU RNs.  Discussion with critical-care nursing staff.  Recommendations were made.  Questions were answered.  They expressed understanding & appreciation.    [name], M.D., F.A.C.S. Gastrointestinal and Minimally Invasive Surgery [facility] [address] [place], [place] [address] [phone] Main / Paging    12/23/2015   Subjective:  At floor bed near RN station. Talking a little around trach Denies much pain Sleepy in day - woke up at 2am confused Daughter in room  Objective:  Vital signs:  Vitals:   12/22/15 2127 12/23/15 0000 12/23/15 0132 12/23/15 0549  BP: 102/60 (!) 99/46 137/79 (!) 134/59  Pulse: 89 99 93  93  Resp: 18  18 16   Temp: 98 F (36.7 C)  98.1 F (36.7 C) 98.2 F (36.8 C)  TempSrc: Axillary  Axillary Axillary  SpO2: 97%  98% 97%  Weight:      Height:        Last BM Date: 12/22/15  Intake/Output   Yesterday:  11/07 0701 - 11/08 0700 In: 687.3 [I.V.:647.3; NG/GT:40] Out: 1500 [Urine:1350; Drains:150] This shift:  Total I/O In: 687.3 [I.V.:647.3; NG/GT:40] Out: 650 [Urine:650]  Bowel function:  Flatus: YES  BM:  YES  Gtube: thin light bile - no blood.  Leaking around skin less Jtube site: cleaner  Physical Exam:  General: Pt awake. Less confused.  Annoyed/Tired.  No acute distress.  Interacting with daughter Eyes: PERRL, normal EOM.  Sclera clear.  No icterus Neuro: CN II-XII intact w/o focal sensory/motor deficits. Lymph: No head/neck/groin lymphadenopathy Psych:  No delerium/psychosis/paranoia HENT: Normocephalic, Mucus membranes moist.  No thrush. ETT in place.  Less secretions Neck: Supple, No tracheal deviation Chest: Chest wall pain w good excursion.  No wheezing. CV:  Pulses intact.  Regular rhythm MS: Normal AROM mjr joints.  No obvious deformity Abdomen:  G tube LUQ.  Rash around skin fair with less drainage  J tube in LLQ - skin irritated but stable  Edema left flank.   Softer.  Nondistended.  Nontender.  No guarding  GU:  Foley in place.  NEMG Ext:  SCDs BLE.  No mjr edema.  Right index finger pad with purple demarcation but + sensation.  Nail bed improved to nail bed.  PICC LUE clean Skin: No more anasarca.  No other petechiae / purpura  Results:   Diagnosis Stomach, biopsy - CHRONIC FOCALLY ACTIVE GASTRITIS. - THERE IS NO EVIDENCE OF HELICOBACTER PYLORI, DYSPLASIA OR MALIGNANCY. - SEE COMMENT. Microscopic Comment A Warthin-Starry stain is negative for the presence of Helicobacter pylori organisms. (JBK:gt, 12/04/15) Enid Cutter MD Pathologist, Electronic Signature (Case signed 12/04/2015) Specimen Salam Micucci and Clinical Information Specimen(s)  Obtained: Stomach, biopsy Specimen Clinical  Labs: Results for orders placed or performed during the hospital encounter of 11/24/15 (from the past 48 hour(s))  Glucose, capillary     Status: Abnormal   Collection Time: 12/21/15  7:37 AM  Result Value Ref Range   Glucose-Capillary 134 (H) 65 - 99 mg/dL  Glucose, capillary     Status: Abnormal   Collection Time: 12/21/15 11:29 AM  Result Value Ref Range   Glucose-Capillary 125 (H) 65 - 99 mg/dL  Glucose, capillary     Status: Abnormal   Collection Time: 12/21/15  4:16 PM  Result Value Ref Range   Glucose-Capillary 116 (H) 65 - 99 mg/dL  Glucose, capillary     Status: Abnormal   Collection Time: 12/21/15  7:50 PM  Result Value Ref Range   Glucose-Capillary 140 (H) 65 - 99 mg/dL  Glucose, capillary     Status: Abnormal   Collection Time: 12/21/15 11:46 PM  Result Value Ref Range   Glucose-Capillary 131 (H) 65 - 99 mg/dL  Glucose, capillary     Status: Abnormal   Collection Time: 12/22/15  4:03 AM  Result Value Ref Range   Glucose-Capillary 138 (H) 65 - 99 mg/dL  Basic metabolic panel     Status: Abnormal   Collection Time: 12/22/15  5:05 AM  Result Value Ref Range   Sodium 148 (H) 135 - 145 mmol/L   Potassium 3.3 (L) 3.5 - 5.1 mmol/L   Chloride 117 (H) 101 - 111 mmol/L   CO2 23 22 - 32 mmol/L   Glucose, Bld 146 (H) 65 - 99 mg/dL   BUN 47 (H) 6 - 20 mg/dL   Creatinine, Ser 0.95 0.61 - 1.24 mg/dL   Calcium 8.1 (L) 8.9 - 10.3 mg/dL   GFR calc non Af Amer >60 >60 mL/min   GFR calc Af Amer >60 >60 mL/min    Comment: (NOTE) The eGFR has been calculated using the CKD EPI equation. This calculation has not been validated in all clinical situations. eGFR's persistently <60 mL/min signify possible Chronic Kidney Disease.    Anion gap 8 5 - 15  Glucose, capillary     Status: Abnormal   Collection Time: 12/22/15  8:03 AM  Result Value Ref Range   Glucose-Capillary 124 (H) 65 - 99 mg/dL  Glucose, capillary     Status: Abnormal    Collection Time: 12/22/15 12:04 PM  Result Value Ref Range   Glucose-Capillary 102 (H) 65 - 99 mg/dL  Glucose, capillary     Status: Abnormal   Collection Time: 12/22/15  6:36 PM  Result Value Ref Range   Glucose-Capillary 117 (H) 65 - 99 mg/dL   Comment 1 Notify RN   Glucose, capillary     Status: Abnormal   Collection Time: 12/22/15 11:20 PM  Result Value Ref Range   Glucose-Capillary 106 (H) 65 - 99 mg/dL  Basic metabolic panel     Status: Abnormal   Collection Time: 12/23/15  4:48 AM  Result Value Ref Range   Sodium 149 (H) 135 - 145 mmol/L   Potassium 3.8 3.5 - 5.1 mmol/L   Chloride 120 (H) 101 - 111 mmol/L   CO2 23 22 - 32 mmol/L   Glucose, Bld 129 (H) 65 - 99 mg/dL   BUN 48 (H) 6 - 20 mg/dL   Creatinine, Ser 0.92 0.61 - 1.24 mg/dL   Calcium 7.8 (L) 8.9 - 10.3 mg/dL   GFR calc non Af Amer >60 >60 mL/min   GFR calc Af Amer >60 >60 mL/min    Comment: (NOTE) The eGFR has been calculated using the CKD EPI equation. This calculation has not been validated in all clinical situations. eGFR's persistently <60 mL/min signify possible Chronic Kidney Disease.    Anion gap 6 5 - 15  Glucose, capillary     Status: Abnormal   Collection Time: 12/23/15  5:45 AM  Result Value Ref Range   Glucose-Capillary 122 (H) 65 - 99 mg/dL    Imaging / Studies: No results found.  Medications / Allergies: per chart  Antibiotics: Anti-infectives    Start     Dose/Rate Route Frequency Ordered Stop   12/09/15 1200  anidulafungin (ERAXIS) 100 mg in sodium chloride 0.9 % 100 mL IVPB     100 mg over 90 Minutes Intravenous Every 24 hours 12/09/15 0918 12/10/15 1333   12/05/15 1200  anidulafungin (ERAXIS) 100 mg in sodium chloride 0.9 % 100 mL IVPB  Status:  Discontinued     100 mg over 90 Minutes Intravenous Every 24 hours 12/04/15 1045 12/09/15 0835   12/04/15 1200  anidulafungin (ERAXIS) 200 mg in sodium chloride 0.9 % 200 mL IVPB     200 mg over 180 Minutes Intravenous  Once 12/04/15 1045  12/04/15 1725   12/04/15 0600  cefoTEtan (CEFOTAN) 2 g in dextrose 5 % 50 mL IVPB     2 g 100 mL/hr over 30 Minutes Intravenous On call to O.R. 12/03/15 1821 12/03/15 2007   12/04/15 0400  piperacillin-tazobactam (ZOSYN) IVPB 3.375 g  Status:  Discontinued     3.375 g 12.5 mL/hr over 240 Minutes Intravenous Every 8 hours 12/03/15 2229 12/15/15 0751   12/02/15 0000  erythromycin ethylsuccinate (EES) 200 MG/5ML suspension 400 mg  Status:  Discontinued    Comments:  Administer via J tube (16Fr LLQ)   400 mg Oral Every 6 hours 12/01/15 2020 12/03/15 0748   11/27/15 1200  erythromycin 500 mg in sodium chloride 0.9 % 100 mL IVPB     500 mg 100 mL/hr over 60 Minutes Intravenous Every 6 hours 11/27/15 0940 11/29/15 0608   11/26/15 0830  erythromycin 250 mg in sodium chloride 0.9 % 100 mL IVPB  Status:  Discontinued     250 mg 100 mL/hr over 60 Minutes Intravenous Every 6 hours 11/26/15 0751 11/27/15 0940        Note: Portions of this report may have been transcribed using voice recognition software. Every effort was made to ensure accuracy; however, inadvertent computerized transcription errors may be present.   Any transcriptional errors that result from this process are unintentional.    [name], M.D., F.A.C.S. Gastrointestinal and Minimally Invasive Surgery [facility] [address], [place], [place] [address] [phone] Main / Paging     12/23/2015

## 2015-12-23 NOTE — Progress Notes (Signed)
PCCM PROGRESS NOTE  Admission date: 11/24/2015 Consult date: 12/03/2015 Referring provider: Dr. Michaell CowingGross  CC: Abdominal pain  BRIEF:  75 y/o male with peptic ulcer disease admitted on 10/10 with recurrent gastric outlet obstruction secondary to gastritis admitted underwent G and J tube placement this admission.  On 10/19 had to go back to the OR after he removed his G tube accidentally leading to peritonitis, intubation, septic shock.  Prolonged vent needs led to tracheostomy on 10/27 (DF).    Subjective:  Daughter at bedside- reports improved overall.  Concerned he may be too sedate during the day   Vital signs: BP (!) 151/58 (BP Location: Right Wrist)   Pulse 95   Temp 98.3 F (36.8 C) (Oral)   Resp (!) 23   Ht 6\' 2"  (1.88 m)   Wt 177 lb 14.6 oz (80.7 kg)   SpO2 99%   BMI 22.84 kg/m   Intake/output: I/O last 3 completed shifts: In: 2256.5 [I.V.:1809.8; NG/GT:446.7] Out: 2301 [Urine:2150; Drains:150; Stool:1]  FiO2 (%):  [28 %] 28 %  General: chronically ill appearing male in NAD HENT: NCAT, tracheostomy c/d/i, PMV in place, improved voice strength.  PULM: CTA B, normal effort CV: RRR, no mgr GI: BS+, G and J  in place, abdominal binder Derm: no rash, mild edema unchanged Neuro: awake, answers questions, globally weak but non-focal  CMP Latest Ref Rng & Units 12/23/2015 12/22/2015 12/21/2015  Glucose 65 - 99 mg/dL 409(W129(H) 119(J146(H) 478(G118(H)  BUN 6 - 20 mg/dL 95(A48(H) 21(H47(H) 08(M40(H)  Creatinine 0.61 - 1.24 mg/dL 5.780.92 4.690.95 6.291.06  Sodium 135 - 145 mmol/L 149(H) 148(H) 147(H)  Potassium 3.5 - 5.1 mmol/L 3.8 3.3(L) 3.5  Chloride 101 - 111 mmol/L 120(H) 117(H) 115(H)  CO2 22 - 32 mmol/L 23 23 24   Calcium 8.9 - 10.3 mg/dL 7.8(L) 8.1(L) 8.4(L)  Total Protein 6.5 - 8.1 g/dL - - 6.1(L)  Total Bilirubin 0.3 - 1.2 mg/dL - - 1.4(H)  Alkaline Phos 38 - 126 U/L - - 212(H)  AST 15 - 41 U/L - - 31  ALT 17 - 63 U/L - - 28    CBC Latest Ref Rng & Units 12/21/2015 12/18/2015 12/15/2015  WBC 4.0 -  10.5 K/uL 8.9 10.6(H) 10.2  Hemoglobin 13.0 - 17.0 g/dL 5.2(W8.4(L) 4.1(L8.6(L) 2.4(M8.8(L)  Hematocrit 39.0 - 52.0 % 25.9(L) 26.0(L) 26.4(L)  Platelets 150 - 400 K/uL 473(H) 573(H) 633(H)    Imaging: No results found.    STUDIES: 10/11  CT abdomen/pelvis> significant distension of the stomach with layering fluid and gas, NG present, worrisome for recurrent gastric outlet obstruction.  RLL consolidation, cholecycstectomy 10/26  CT abdomen/pelvis> Moderate dilatation of proximal and mid small bowel loops, with transition point in anterior right lower quadrant, suspicious for partial small bowel obstruction. Pneumatosis involving multiple dilated small bowel loops in the left abdomen pelvis, suspicious for bowel ischemia. No evidence of portal venous gas or free intraperitoneal air. Two left upper quadrant fluid collections adjacent to the stomach and spleen, which may represent postoperative fluid collections or abscesses. Small amount free fluid also noted in pelvis.Small bilateral pleural effusions and bibasilar atelectasis. Diffuse mesenteric and body wall edema. 11/02  Modified barium swallow> silent aspiration  CULTURES: Blood 10/19 >> neg Fluid cx 10/17 >> neg Fluid cx 10/27 >> neg  ANTIBIOTICS: Zosyn 10/19 >> off Anidulafungin 10/19 >> 10/26   EVENTS / STUDIES: 10/10  Admit  10/17  Lap placement of J & G tubes, EGD w/ balloon dilation of duodenal stricture, bx,  closure of gastrotomy 10/18  Sundowning, pulled out G tube 10/19  Lap findings of gastric leak, bilious ascites, jejunal disruption, omentoplexy of jejunal disruption  10/20  R hand discoloration, aline d/c'd, VVS consulted with rec's for heparin 10/24  Extubated. Required re-intubation.  10/25  Precedex for acute encephalopathy; new drainage from J tube. TF stopped.   10/26  Heparin d/c'd by vascular. On pressors, albumin, lasix.  Developed rectal bleeding after starting bowel prep.  10/26  CT ABD >> evidence of bowel pneumotosis  worrisome for ischemia, and two LUQ fluid collections 10/27  Trach placed. TO IR for Perc drains to be placed in LUQ 10/30  EGD >> mild esophagitis, evidence of billroth procedure, gastric pouch, non-bleeding duodenal ulcers, non-bleeding jejunal ulcers, could not reach j - tube 10/31 PMSV trial, pt tolerated well 11/01  24 hours on trach collar; PMV in place  LINES: 10/19 ETT >> 10/24 then replaced 10/24 > 10/27 10/19 CVL left subclavian >>  10/19 R brachial arterial line >> 10/20 10/19 abdominal drain x3 >> 10/19 G tube >> 10/19 J tube >> 10/27 Trach placed (feinstein # 6 cuffed), Abd drains by IR 10/29 J dislodged, replaced by IR 11/3 early AM pulled out J tube, replaced by surgery  SUMMARY: 75 yo male former smoker adm with gastric outlet obstruction with pyloric channel ulcer.  Had Billroth 1, vagotomy, fundoplication, omentopexy August 2017 with initial improvement.  Had recurrent symptoms September 2017 that become progressively worse with gastric dilation from recurrent stricture.  Had laparoscopic placement of jejunostomy tube, gastrostomy tube, and EGD balloon dilation of duodenal stricture 10/17.  Developed gastric leakage with peritonitis after pulling out PEG tube 10/19 and taken to OR. On 10/27 found 2 new LUQ abscesses/fluid collections which were drained percutaneously by IR. Had perc trach 10/27.  Course further complicated by rectal bleeding, emboli to R fingers requiring heparin gtt.     PMHx of CAD s/p stent, GERD, HTN, HLD  ASSESSMENT / PLAN:   Acute and chronic respiratory failure  Tracheostomy status.  - tracheostomy collar O2 supplementation to maintain O2 > 90% - PMSV in place when supervised - Cont routine trach care  - consider change to cuffless trach by 11/10.  Favor keeping #6 for now given potential for vent need - would consider decannulation only when he is ambulatory  Recurrent gastric outlet obstruction GI bleeding - source inflamed small bowel?  Resolved Abdominal abscess/fluid collections s/p IR drains Protein-calorie malnutrition Dislodged J overnight 11/3 - replaced - G and J tube management per surgery - nutrition per surgery, trickle feeds started - TPN per surgery - monitoring off antibiotics - SLP eval ongoing, MBS deferred  Hypertension with Sinus tachycardia - low dose metoprolol  AKI - Resolved Hypernatremia  Hyperchloremia  hypokalemia - replace electrolytes as needed  Acute metabolic encephalopathy, improved 11/4 Post-op pain Severe Physical deconditioning  - PRN moprhine for pain, can add tylenol #3 - haldol prn for severe agitation, reduce frequency  - continue scheduled QHS haldol 1 mg, monitor QTc on tele monitor.  Rt index finger ischemia likely from Rt brachial a line, improved, no clot on upper ext doppler ultrasound, good pulse - VVS consulted 10/20, appreciate input-->resolved  - no more arterial lines right arm  Hyperglycemia - SSI   SUP - Famotidine IV DVT prophylaxis - SCDs Goals of care - Full code   Canary BrimBrandi Aftan Vint, NP-C [facility] Pgr: [phone] or if no answer [phone] 12/23/2015, 9:59 AM

## 2015-12-23 NOTE — NC FL2 (Signed)
[place] LEVEL OF CARE SCREENING TOOL     IDENTIFICATION  Patient Name: [name] Birthdate: 1940-06-02 Sex: male Admission Date (Current Location): 11/24/2015  [facility] and [place] Number:  Herbalist and Address:  [facility],  [place] [address] [place]      Provider Number: [id number]  Attending Physician Name and Address:  Michael Boston, MD  Relative Name and Phone Number:       Current Level of Care: Hospital Recommended Level of Care: [place] Prior Approval Number:    Date Approved/Denied:   PASRR Number:  [id number])  Discharge Plan: SNF    Current Diagnoses: Patient Active Problem List   Diagnosis Date Noted  . Pneumatosis of intestines 12/11/2015  . Tobacco abuse 12/09/2015  . Ischemic right index finger at tip 12/09/2015  . Gastritis 12/09/2015  . Gastric tube present (LUQ) 12/09/2015  . Jejunostomy tube present (LLQ) 12/09/2015  . Acute respiratory failure with hypoxemia ([place])   . Protein-calorie malnutrition, severe 12/04/2015  . Encephalopathy acute 12/03/2015  . Bile peritonitis due to gastric tube dislodgement 12/03/2015  . Endotracheally intubated   . Gastric Ileus  11/25/2015  . Gastric outlet obstruction 11/24/2015  . Anxiety state 09/28/2015  . Hyperlipidemia   . 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 Coronary atherosclerosis of native coronary artery 04/24/2013  . Essential hypertension, benign 04/24/2013  . Other and unspecified hyperlipidemia 04/24/2013  . Central pterygium 10/30/2012  . 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 Cataract, nuclear sclerotic senile 10/30/2012    Orientation RESPIRATION BLADDER Height & Weight     Self  Tracheostomy (at 28% ) Incontinent, External catheter Weight: 177 lb 14.6 oz (80.7 kg) Height:  6' 2" (188 cm)  BEHAVIORAL SYMPTOMS/MOOD NEUROLOGICAL BOWEL  NUTRITION STATUS   (none )  (none ) Continent Diet (currently NPO with ice chips )  AMBULATORY STATUS COMMUNICATION OF NEEDS Skin   Extensive Assist Verbally Surgical wounds (Abdomen, dressing: PRN )                       Personal Care Assistance Level of Assistance  Total care       Total Care Assistance: Maximum assistance   Functional Limitations Info  Speech, Hearing, Sight Sight Info: Impaired Hearing Info: Adequate Speech Info: Adequate    SPECIAL CARE FACTORS FREQUENCY  PT (By licensed PT), OT (By licensed OT)     PT Frequency: 3 OT Frequency: 3            Contractures      Additional Factors Info  Code Status, Allergies Code Status Info: FULL CODE  Allergies Info: Flomax Tamsulosin Hcl, Lisinopril, Lorazepam, Uroxatral Alfuzosin Hcl Er           Current Medications (12/23/2015):  This is the current hospital active medication list Current Facility-Administered Medications  Medication Dose Route Frequency Provider Last Rate Last Dose  . acetaminophen (TYLENOL) solution 650 mg  650 mg Per Tube Q4H PRN Rigoberto Noel, MD   650 mg at 12/22/15 1856  . acetaminophen (TYLENOL) suppository 650 mg  650 mg Rectal Q6H PRN Michael Boston, MD      . alum & mag hydroxide-simeth (MAALOX/MYLANTA) 200-200-20 MG/5ML suspension 30 mL  30 mL Oral Q6H PRN Michael Boston, MD      . bisacodyl (DULCOLAX) suppository 10 mg  10 mg Rectal Daily Michael Boston, MD  10 mg at 12/21/15 0833  . chlorhexidine gluconate (MEDLINE KIT) (PERIDEX) 0.12 % solution 15 mL  15 mL Mouth Rinse BID Michael Boston, MD   15 mL at 12/22/15 2154  . cycloSPORINE (RESTASIS) 0.05 % ophthalmic emulsion 1 drop  1 drop Both Eyes BID Michael Boston, MD   1 drop at 12/22/15 2153  . TPN (CLINIMIX-E) Adult   Intravenous Continuous TPN Polly Cobia, RPH 40 mL/hr at 12/22/15 1715     And  . fat emulsion 20 % infusion 240 mL  240 mL Intravenous Continuous TPN Polly Cobia, RPH 10 mL/hr at 12/22/15 1716 240 mL at  12/22/15 1716  . feeding supplement (VITAL AF 1.2 CAL) liquid 1,000 mL  1,000 mL Per Tube Q24H Michael Boston, MD      . haloperidol lactate (HALDOL) injection 1 mg  1 mg Intravenous QHS,MR X 1 Michael Boston, MD      . haloperidol lactate (HALDOL) injection 1-2 mg  1-2 mg Intravenous Q6H PRN Juanito Doom, MD   1 mg at 12/23/15 0203  . hydrALAZINE (APRESOLINE) injection 5 mg  5 mg Intravenous Q4H PRN Marshell Garfinkel, MD   5 mg at 12/20/15 1918  . insulin aspart (novoLOG) injection 0-9 Units  0-9 Units Subcutaneous Q6H Polly Cobia, RPH   1 Units at 12/23/15 1696  . irbesartan (AVAPRO) tablet 75 mg  75 mg Per Tube Daily Michael Boston, MD      . lactated ringers bolus 1,000 mL  1,000 mL Intravenous Q8H PRN Michael Boston, MD      . lip balm (CARMEX) ointment 1 application  1 application Topical BID Michael Boston, MD      . lip balm (CARMEX) ointment   Topical PRN Michael Boston, MD      . liver oil-zinc oxide (DESITIN) 40 % ointment   Topical BID Michael Boston, MD   1 application at 78/93/81 2156  . magic mouthwash  15 mL Oral QID PRN Michael Boston, MD      . MEDLINE mouth rinse  15 mL Mouth Rinse QID Michael Boston, MD   15 mL at 12/23/15 0437  . menthol-cetylpyridinium (CEPACOL) lozenge 3 mg  1 lozenge Oral PRN Michael Boston, MD      . metoprolol (LOPRESSOR) injection 5 mg  5 mg Intravenous Q6H PRN Michael Boston, MD   5 mg at 12/16/15 1300  . morphine 2 MG/ML injection 1-2 mg  1-2 mg Intravenous Q3H PRN Rigoberto Noel, MD   2 mg at 12/23/15 0747  . nicotine (NICODERM CQ - dosed in mg/24 hours) patch 21 mg  21 mg Transdermal Daily Anders Simmonds, MD   21 mg at 12/21/15 0175  . nitroGLYCERIN (NITROSTAT) SL tablet 0.4 mg  0.4 mg Sublingual Q5 min PRN Michael Boston, MD      . nystatin (MYCOSTATIN/NYSTOP) topical powder   Topical TID Michael Boston, MD      . ondansetron [facility]) injection 4 mg  4 mg Intravenous Q6H PRN Jerrye Beavers, PA-C   4 mg at 12/13/15 1811  . pantoprazole sodium (PROTONIX) 40 mg/20 mL  oral suspension 40 mg  40 mg Per Tube BID Michael Boston, MD   40 mg at 12/22/15 2154  . phenol (CHLORASEPTIC) mouth spray 2 spray  2 spray Mouth/Throat PRN Michael Boston, MD      . polyvinyl alcohol (LIQUIFILM TEARS) 1.4 % ophthalmic solution 2 drop  2 drop Both Eyes BID Michael Boston, MD  2 drop at 12/22/15 2155  . prochlorperazine (COMPAZINE) injection 5-10 mg  5-10 mg Intravenous Q4H PRN Michael Boston, MD      . sodium chloride flush (NS) 0.9 % injection 10-40 mL  10-40 mL Intracatheter PRN Arta Silence, MD   10 mL at 12/01/15 0510  . sodium chloride flush (NS) 0.9 % injection 10-40 mL  10-40 mL Intracatheter Q12H Michael Boston, MD   10 mL at 12/21/15 0916  . sodium chloride flush (NS) 0.9 % injection 10-40 mL  10-40 mL Intracatheter PRN Michael Boston, MD   10 mL at 12/23/15 0448  . traMADol (ULTRAM) tablet 50-100 mg  50-100 mg Oral Q6H PRN Michael Boston, MD         Discharge Medications: Please see discharge summary for a list of discharge medications.  Relevant Imaging Results:  Relevant Lab Results:   Additional Information SSN: [id number]   Glendon Axe, MSW [phone] 12/23/2015 8:48 AM

## 2015-12-23 NOTE — Progress Notes (Signed)
Advanced tube feeds to 55 an hour ordered by me.  Advance to goal at 65 an hour per nutrition recommendations in 8 hours

## 2015-12-23 NOTE — Progress Notes (Signed)
[place] NOTE   Pharmacy Consult for TPN  Patient Measurements: Height: _0  (188 cm) Weight: 177 lb 14.6 oz (80.7 kg) IBW/kg (Calculated) : 82.2 TPN AdjBW (KG): 97.2 Body mass index is 22.84 kg/m.  Insulin Requirements: 2 units Novolog yesterday  Current Nutrition: TFs @ 40 ml/hr + TPN at half goal rate  IVF: None  Central access: PICC placed 10/12, pt pulled out 10/19. CVC in place since 10/19 TPN date: 10/12-10/19, resumed 10/25  ASSESSMENT                                                                                                          HPI: 36 yoM admitted 10/9 with new upper abdominal discomfort and bloating with concern for recurrent GOO due to chronic pyloric stricture. 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  Pharmacy consulted to start TPN for gastric ileus and obstruction 10/12. Patient underwent G and J tube placement this admission. On 10/19, patient pulled out PICC line. He also had to go back to OR after he removed his G tube accidentally, leading to peritonitis, intubation, and septic shock. Tube feed rate slowly advanced by CCS, but today patient noted to have dark brown output around J tube site, and tube feeds held. Pharmacy asked to resume TPN.   Significant Events:  10/19: Pulled G-tube leading to peritonitis, intubation, septic shock 10/30: EGD - esophagitis, edema/erythematous stomach, stenosed Billroth I gastroduodenostomy,  Gastric pouch w/ ulceration, gastroduodenal anastomosis with ulceration, non-bleeding ulcers found in duodenum and jejunum.  10/31 and 11/1: peritoneal drains removed by IR 11/3: Pulled out J tube, replaced by surgery. TFs on hold 11/6: Retry trophic TFs via J tube 11/7: advancing TFs to 30 ml/hr today (goal 65 ml/hr) - provides 54 g protein and 864 calories 11/8: Per Dr. Johney Maine, advance TFs by 10 ml/hr q8h to goal 70 ml/hr, continue  TPN until pt tolerating goal rate of TFs  Today, 12/23/15:   Glucose - No hx DM noted. CBGs at goal <150.  Electrolytes - Na and Cl- high (stable): Clinimix contains 35 meq/L of Na+ so not likely contributing much to elevated Na, K+ replaced and now WNL  Renal - AKI -resolved; UOP adequate  I/O: -5L since admit; small BM  LFTs - AST/ALT WNL, T.Bili improving, Alk phos rising  TGs -76 (10/13), 97 (10/16), 214 (10/26), 194 (10/30), 199 (11/6)  Prealbumin - 17.8 (10/11), 12.8 (10/13), 11 (10/16), 12.5 (10/18), < 5 (10/23), 6.4 (10/26), 9.2 (10/30), 10.9 (11/6)  NUTRITIONAL GOALS  RD recs:  QOHC:0979-4997 kcal/kg, Protein: 100-117 g, Fluids: per MD/NP/surgery  Clinimix 5/15 at a goal rate of 94m/hr would provide 100g/day protein, 1894 Kcal/day.  **There is currently a nPsychologist, prison and probation servicesof Clinimix solution. To conserve supply, will keep goal rate of Clinimix 5/15 at 83 ml/hr + 20% fat emulsion at 120mhr  PLAN                                                                                          At 1800 today:  Continue Clinimix to E 5/20 at 40 ml/hr   Continue electrolytes in TPN despite mild hypernatremia.    20% fat emulsion at 10 ml/hr.  TPN to contain standard multivitamins and trace elements.  IVF per MD.  Continue sensitive SSI q6h  TPN lab panels on Mondays & Thursdays.    F/u if pt tolerates TFs, wean TPN when TFs at goal.    F/u daily  CoRalene BathePharmD, BCPS 12/23/2015, 10:28 AM  Pager: [phone]

## 2015-12-23 NOTE — Evaluation (Signed)
Occupational Therapy Evaluation Patient Details Name: [name] MRN: [id number] DOB: May 04, 1940 Today's Date: 12/23/2015    History of Present Illness 75 y/o male with peptic ulcer disease admitted on 10/10 with recurrent gastric outlet obstruction secondary to gastritis admitted underwent G and J tube placement this admission.  On 10/19 had to go back to the OR after he removed his G tube accidentally leading to peritonitis, intubation, septic shock. Rt index finger ischemia likely from Rt brachial a line. Tracheostomy 12/11/15. Trial  off of vent /on Trach collar 10/30 until trip to endo then back on vent.     Clinical Impression   Pt was admitted for the above.  Seen in conjunction with PT for mobility. Pt will benefit from continued OT to increase strength, endurance, and balance for adls.  Pt fatiques easily but tried everything asked of him.  Goals are for mod +2 for sit to stand and transfers from recliner/3:1 as well as UB adl goal with min A and UE strengthening goal. Pt currently needs mod to max +2 for sit to stand and mostly max to total A (+2 when standing).    Follow Up Recommendations  SNF    Equipment Recommendations  3 in 1 bedside comode    Recommendations for Other Services       Precautions / Restrictions Precautions Precautions: Fall Precaution Comments:  J tube, G tube, PEG. multiple lines, trach collar 6 lts 28% very weak legs Restrictions Weight Bearing Restrictions: No      Mobility Bed Mobility by PT Overal bed mobility: Needs Assistance Bed Mobility: Supine to Sit     Supine to sit: Mod assist;Max assist     General bed mobility comments: demonstarated increased ability to rise with + 1 assist.  Utilized bed pad to complete scooting.  Demonstarted increased ability to maintain upright sitting balance.  Tolerated EOB x 6 min before showing signs of fatigue.    Transfers Overall transfer level: Needs assistance Equipment used: Bilateral  platform walker Transfers: Sit to/from Stand Sit to Stand: Mod assist;+2 physical assistance;From elevated surface;Max assist from recliner Stand pivot transfers: Max assist;+2 physical assistance;+2 safety/equipment       General transfer comment: + 2  assist to stand and control descent.  +2 total A to scoot forward in chair, and max +2 assistance to stand from lower surface.     Balance   Sitting-balance support: Bilateral upper extremity supported Sitting balance-Leahy Scale: Fair Sitting balance - Comments: needs min guard, occasional min A and cues to support self on EOB when lifting one arm                                    ADL Overall ADL's : Needs assistance/impaired Eating/Feeding: NPO   Grooming: Minimal assistance;Sitting           Upper Body Dressing : Maximal assistance;Sitting       Toilet Transfer: Moderate assistance;+2 for physical assistance;RW (to chair)             General ADL Comments: pt needs total A for most ADLs, +2 for sit to stand.  Pt with generalized weakness.  Able to sit EOB statically with fair balance but loses balance posteriorly when lifting arms.  Pt is NPO with trach collar:  needs assistance to suction   Pt on trach collar, 28% during session.  02 sats remained 95-97%.  HR 102-133  during activity     Vision     Perception     Praxis      Pertinent Vitals/Pain Pain Assessment: No/denies pain     Hand Dominance     Extremity/Trunk Assessment Upper Extremity Assessment Upper Extremity Assessment: Generalized weakness           Communication Communication Communication: Tracheostomy   Cognition Arousal/Alertness: Awake/alert Behavior During Therapy: WFL for tasks assessed/performed Overall Cognitive Status: Within Functional Limits for tasks assessed                     General Comments       Exercises       Shoulder Instructions      Home Living Family/patient expects to be  discharged to:: Unsure                                 Additional Comments: pt from home with wife. He has a walk in shower and high commode at home      Prior Functioning/Environment                   OT Problem List: Decreased strength;Decreased activity tolerance;Impaired balance (sitting and/or standing);Decreased knowledge of use of DME or AE;Pain;Cardiopulmonary status limiting activity   OT Treatment/Interventions: Self-care/ADL training;DME and/or AE instruction;Energy conservation;Therapeutic activities;Patient/family education;Balance training    OT Goals(Current goals can be found in the care plan section) Acute Rehab OT Goals Patient Stated Goal: per daughter, to get stronger OT Goal Formulation: With patient Time For Goal Achievement: 01/06/16 Potential to Achieve Goals: Good ADL Goals Pt Will Transfer to Toilet: with mod assist;with +2 assist;bedside commode;stand pivot transfer Additional ADL Goal #1: pt will perform UB adls with min A from supported sitting position Additional ADL Goal #2: pt will go from sit to stand from recliner with mod A +2 for adls Additional ADL Goal #3: pt will maintain static standing balance for adls with min guard for 2 minutes Additional ADL Goal #4: pt will perform Level one theraband exercises with supervison and min cues  OT Frequency: Min 2X/week   Barriers to D/C:            Co-evaluation PT/OT/SLP Co-Evaluation/Treatment: Yes (overlapped)   PT goals addressed during session: Mobility/safety with mobility OT goals addressed during session: ADL's and self-care;Strengthening/ROM      End of Session    Activity Tolerance: Patient tolerated treatment well Patient left: in chair;with call bell/phone within reach;with family/visitor present   Time: [id number] OT Time Calculation (min): 26 min Charges:  OT General Charges $OT Visit: 1 Procedure OT Evaluation $OT Eval Moderate Complexity: 1  Procedure G-Codes:    [name] 12/23/2015, 3:51 PM  Marica OtterMaryellen Veronika [name], OTR/L [phone] 12/23/2015

## 2015-12-23 NOTE — Progress Notes (Signed)
SLP Cancellation Note  Patient Details Name: Ardyth GalRalph T Harlin MRN: [id number] DOB: 1940-05-17   Cancelled treatment:       Reason Eval/Treat Not Completed: Other (comment) (pt having medical care currently, will continue efforts)   Donavan Burnetamara Ronson Hagins, MS [facility] SLP [phone]

## 2015-12-23 NOTE — Progress Notes (Signed)
Patient is agitated and wants to get up and get out of the bed,patient was reoriented. We will continue to monitor and assess the patient.

## 2015-12-23 NOTE — Clinical Social Work Placement (Signed)
   CLINICAL SOCIAL WORK PLACEMENT  NOTE  Date:  12/23/2015  Patient Details  Name: Ardyth GalRalph T Postle MRN: [id number] Date of Birth: 22-Apr-1940  Clinical Social Work is seeking post-discharge placement for this patient at the Skilled  Nursing Facility level of care (*CSW will initial, date and re-position this form in  chart as items are completed):  Yes   Patient/family provided with [facility] Clinical Social Work Department's list of facilities offering this level of care within the geographic area requested by the patient (or if unable, by the patient's family).  Yes   Patient/family informed of their freedom to choose among providers that offer the needed level of care, that participate in Medicare, Medicaid or managed care program needed by the patient, have an available bed and are willing to accept the patient.  Yes   Patient/family informed of 's ownership interest in [facility] and [facility], as well as of the fact that they are under no obligation to receive care at these facilities.  PASRR submitted to EDS on 12/23/15     PASRR number received on 12/23/15     Existing PASRR number confirmed on       FL2 transmitted to all facilities in geographic area requested by pt/family on 12/23/15     FL2 transmitted to all facilities within larger geographic area on       Patient informed that his/her managed care company has contracts with or will negotiate with certain facilities, including the following:            Patient/family informed of bed offers received.  Patient chooses bed at       Physician recommends and patient chooses bed at      Patient to be transferred to   on  .  Patient to be transferred to facility by       Patient family notified on   of transfer.  Name of family member notified:        PHYSICIAN Please sign FL2     Additional Comment:    _______________________________________________ Derenda FennelNixon, Semone Orlov A 12/23/2015, 10:05  AM

## 2015-12-23 NOTE — Progress Notes (Signed)
OT Cancellation Note  Patient Details Name: Danny GalRalph T Schlink MRN: [id number] DOB: February 09, 1941   Cancelled Treatment:    Reason Eval/Treat Not Completed: Other (comment).  Spoke to [name]  Pt is fatiqued at this time. They request that I check back after 2:00.  [name] 12/23/2015, 11:35 AM  [name], OTR/L [phone] 12/23/2015

## 2015-12-24 LAB — COMPREHENSIVE METABOLIC PANEL
ALT: 44 U/L (ref 17–63)
AST: 43 U/L — AB (ref 15–41)
Albumin: 1.6 g/dL — ABNORMAL LOW (ref 3.5–5.0)
Alkaline Phosphatase: 305 U/L — ABNORMAL HIGH (ref 38–126)
Anion gap: 8 (ref 5–15)
BUN: 58 mg/dL — AB (ref 6–20)
CHLORIDE: 121 mmol/L — AB (ref 101–111)
CO2: 23 mmol/L (ref 22–32)
CREATININE: 1.19 mg/dL (ref 0.61–1.24)
Calcium: 8 mg/dL — ABNORMAL LOW (ref 8.9–10.3)
GFR, EST NON AFRICAN AMERICAN: 58 mL/min — AB (ref >60)
Glucose, Bld: 140 mg/dL — ABNORMAL HIGH (ref 65–99)
POTASSIUM: 3.8 mmol/L (ref 3.5–5.1)
SODIUM: 152 mmol/L — AB (ref 135–145)
Total Bilirubin: 1.1 mg/dL (ref 0.3–1.2)
Total Protein: 6.2 g/dL — ABNORMAL LOW (ref 6.5–8.1)

## 2015-12-24 LAB — PHOSPHORUS: PHOSPHORUS: 3.4 mg/dL (ref 2.5–4.6)

## 2015-12-24 LAB — GLUCOSE, CAPILLARY: GLUCOSE-CAPILLARY: 131 mg/dL — AB (ref 65–99)

## 2015-12-24 LAB — MAGNESIUM: MAGNESIUM: 2.2 mg/dL (ref 1.7–2.4)

## 2015-12-24 MED ORDER — METOCLOPRAMIDE HCL 5 MG/ML IJ SOLN: 5.0000 mg | Freq: Four times a day (QID) | INTRAMUSCULAR | Status: DC | PRN

## 2015-12-24 MED ORDER — METOPROLOL TARTRATE 12.5 MG HALF TABLET
12.5000 mg | ORAL_TABLET | Freq: Two times a day (BID) | ORAL | Status: DC
  Administered 2015-12-24 – 2015-12-28 (×9): 12.5 mg
  Filled 2015-12-24 (×10): qty 1

## 2015-12-24 MED ORDER — SODIUM CHLORIDE 0.9 % IV BOLUS (SEPSIS)
500.0000 mL | Freq: Once | INTRAVENOUS | Status: AC
Start: 2015-12-24 — End: 2015-12-24
  Administered 2015-12-24: 500 mL via INTRAVENOUS

## 2015-12-24 MED ORDER — HALOPERIDOL 1 MG PO TABS
1.0000 mg | ORAL_TABLET | Freq: Every evening | ORAL | Status: DC | PRN
  Administered 2015-12-24 – 2015-12-27 (×4): 1 mg via ORAL
  Filled 2015-12-24 (×8): qty 1

## 2015-12-24 MED ORDER — TRAMADOL HCL 50 MG PO TABS
50.0000 mg | ORAL_TABLET | Freq: Four times a day (QID) | ORAL | Status: DC | PRN
  Administered 2015-12-24 – 2015-12-25 (×2): 50 mg
  Administered 2015-12-25 – 2015-12-27 (×5): 100 mg
  Administered 2015-12-27: 50 mg
  Administered 2015-12-28 – 2015-12-30 (×6): 100 mg
  Filled 2015-12-24 (×5): qty 2
  Filled 2015-12-24: qty 1
  Filled 2015-12-24 (×3): qty 2
  Filled 2015-12-24: qty 1
  Filled 2015-12-24 (×3): qty 2
  Filled 2015-12-24: qty 1
  Filled 2015-12-24: qty 2

## 2015-12-24 MED ORDER — FENTANYL CITRATE (PF) 100 MCG/2ML IJ SOLN
25.0000 ug | INTRAMUSCULAR | Status: DC | PRN
  Administered 2015-12-24 – 2015-12-25 (×2): 50 ug via INTRAVENOUS
  Filled 2015-12-24 (×2): qty 2

## 2015-12-24 MED ORDER — ACETAMINOPHEN 160 MG/5ML PO SOLN
1000.0000 mg | Freq: Three times a day (TID) | ORAL | Status: DC
  Administered 2015-12-24 (×2): 1000 mg
  Filled 2015-12-24 (×2): qty 40.6

## 2015-12-24 MED ORDER — NICOTINE 14 MG/24HR TD PT24
14.0000 mg | MEDICATED_PATCH | Freq: Every day | TRANSDERMAL | Status: DC
  Administered 2015-12-25 – 2015-12-27 (×3): 14 mg via TRANSDERMAL
  Filled 2015-12-24 (×3): qty 1

## 2015-12-24 NOTE — Progress Notes (Signed)
[place] NOTE   Pharmacy Consult for TPN  Patient Measurements: Height: 6' 2"  (188 cm) Weight: 182 lb 1.6 oz (82.6 kg) IBW/kg (Calculated) : 82.2 TPN AdjBW (KG): 97.2 Body mass index is 23.38 kg/m.  Insulin Requirements: 2 units Novolog / 24h  Current Nutrition: TFs @ 40 ml/hr + TPN at half goal rate  IVF: None  Central access: PICC placed 10/12, pt pulled out 10/19. CVC in place since 10/19 TPN date: 10/12-10/19, resumed 10/25  ASSESSMENT                                                                                                          HPI: 52 yoM admitted 10/9 with new upper abdominal discomfort and bloating with concern for recurrent GOO due to chronic pyloric stricture. 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  Pharmacy consulted to start TPN for gastric ileus and obstruction 10/12. Patient underwent G and J tube placement this admission. On 10/19, patient pulled out PICC line. He also had to go back to OR after he removed his G tube accidentally, leading to peritonitis, intubation, and septic shock. Tube feed rate slowly advanced by CCS, but today patient noted to have dark brown output around J tube site, and tube feeds held. Pharmacy asked to resume TPN.   Significant Events:  10/19: Pulled G-tube leading to peritonitis, intubation, septic shock 10/30: EGD - esophagitis, edema/erythematous stomach, stenosed Billroth I gastroduodenostomy,  Gastric pouch w/ ulceration, gastroduodenal anastomosis with ulceration, non-bleeding ulcers found in duodenum and jejunum.  10/31 and 11/1: peritoneal drains removed by IR 11/3: Pulled out J tube, replaced by surgery. TFs on hold 11/6: Retry trophic TFs via J tube 11/7: advancing TFs to 30 ml/hr today (goal 65 ml/hr) - provides 54 g protein and 864 calories 11/8: Per Dr. Johney Maine, advance TFs by 10 ml/hr q8h to goal 70 ml/hr, continue TPN  until pt tolerating goal rate of TFs 11/9: TFs running at goal rate of 65 ml/hr - ok to wean off TPN  Today, 12/24/15:   Glucose - No hx DM noted. CBGs at goal <150.  Electrolytes - Na and Cl- high and rising: Clinimix contains 35 meq/L of Na+ so not likely contributing much to elevated Na  Renal - AKI -resolved; SCr and BUN small bump overnight, UOP adequate  I/O: -5L since admit; small BM  LFTs - AST/ALT WNL but trending up, T.Bili improving, Alk phos rising  TGs -76 (10/13), 97 (10/16), 214 (10/26), 194 (10/30), 199 (11/6)  Prealbumin - 17.8 (10/11), 12.8 (10/13), 11 (10/16), 12.5 (10/18), < 5 (10/23), 6.4 (10/26), 9.2 (10/30), 10.9 (11/6)  NUTRITIONAL GOALS  RD recs:  JQGB:2010-0712 kcal/kg, Protein: 100-117 g, Fluids: per MD/NP/surgery  Clinimix 5/15 at a goal rate of 33m/hr would provide 100g/day protein, 1894 Kcal/day.  **There is currently a nPsychologist, prison and probation servicesof Clinimix solution. To conserve supply, will keep goal rate of Clinimix 5/15 at 83 ml/hr + 20% fat emulsion at 170mhr  PLAN                                                                                          Stop TPN at lipids, already at half rate as pt tolerating TFs at goal rate per surgery -D/C SSI / CBG checks -Stop TPN labs -Alert IV team to take down TPN and lipids  CoRalene BathePharmD, BCPS 12/24/2015, 7:40 AM  Pager: [phone]

## 2015-12-24 NOTE — Progress Notes (Signed)
Dr Carolynne Edouardoth made aware of the above as he is doctor of the week.  Orders received for fluid challenge of 500cc normal saline.

## 2015-12-24 NOTE — Clinical Social Work Note (Signed)
SNF search extended.   Derenda FennelBashira Paulette Rockford, MSW [phone] 12/24/2015 4:31 PM

## 2015-12-24 NOTE — Progress Notes (Signed)
Nutrition Follow-up  DOCUMENTATION CODES:   Severe malnutrition in context of acute illness/injury  INTERVENTION:  - TPN d/c per Pharmacy. - Continue Vital 1.2 @ goal rate of 65 mL/hr.  - Recommend free water flush per J-tube per surgeon discretion with d/c of TPN. - RD will follow-up 11/10.  NUTRITION DIAGNOSIS:   Inadequate oral intake related to inability to eat as evidenced by NPO status. -ongoing  GOAL:   Patient will meet greater than or equal to 90% of their needs -met with current nutrition support.   MONITOR:   TF tolerance, Weight trends, Labs, Skin, I & O's, Other (Comment) (TPN d/c)  ASSESSMENT:   75 year old male presenting for a post-operative visit. Note for "Post-Operative": Patient returns one month status post robotic resection.  Distal gastrectomy with Billroth I gastroduodenal reconstruction for chronic ulcer causing obstruction.  09/25/2015  11/9 Pt now at recommended goal rate for TF: Vital 1.2 @ 65 mL/hr which is providing 1872 kcal, 117 grams of protein, and 1265 mL free water. Pt denies abdominal pain/pressure or nausea with TF at goal rate. Spoke with Pharmacist via phone who reports hopeful to be able to d/c TPN this afternoon or this evening. Will follow-up to monitor.  Spoke with pt's daughter and wife, who are at bedside, as pt nods and shakes head but does not use verbal communication at time of RD visit. Daughter states that pt has been having fairly frequent BMs and asks if this is normal or concerning. Talked with family about provision of TF and that with GI motility improving with improvement of ileus more frequent BMs to be expected and are not concerning from a nutrition-based standpoint. Daughter reports that pt is gaining strength and his coloration is much improved but he is still very weak. She asks about a time frame for regaining strength; deferred this to Surgeon. Daughter also asks about ability to repeat swallow evaluation; again deferred  to Surgeon and his coordination with SLP.   Weight +1.9 kg from 11/7 and now consistent with weight on 11/6. Will continue to monitor weight trends, especially once TPN d/c'ed.  Medications reviewed; PRN Maalox, 10 mg Dulcolax/day, sliding scale Novolog, PRN IV Zofran, PRN IV Compazine. Labs reviewed; CBG: 131 mg/dL this AM, Na: 152 mmol/, Cl: 121 mmol/L, BUN: 58 mg/dL, Ca: 8 mg/dL, GFR: 58 mL/min, Alk Phos and Ast elevated, no recent triglyceride value.     11/8 -  He is currently receiving Vital 1.2 @ 40 mL/hr via J-tube with order to increase by 10 mL every 8 hours to reach surgeon-appointed TF goal of Vital 1.2 @ 70 mL/hr. - Vital 1.2 @ 50 mL/hr will provide 1440 kcal, 90 grams of protein, and 973 mL free water.  - Vital 1.2 @ 70 mL/hr will provide 2016 kcal, 126 grams of protein (108% max estimated protein need), and 1364 mL free water.  - At time of visit, pt reports severe back pain.  - He denies nausea at time of visit or since TF rate increase from 30 mL/hr to 40 mL around 0800 this AM.  - Pt states that he is having internal abdominal pain but states that this did not occur until staff turned him late AM.  - Per Dr. Johney Maine' not this AM, plan to begin using G-tube for medication administration as ileus appears to be resolving.  - Note indicates that abdominal binder remains in place. - Spoke with Pharmacist who reports plan to maintain TPN regimen from yesterday: Clinimix  E 5/15 @ 40 mL/hr with 20% ILE @ 10 mL/hr.  - This regimen provides 1325 kcal, 48 grams of protein.  - If medically feasible, recommend d/c TPN tomorrow as pt will be meeting >75% estimated nutrition needs with TF once TF increased to 50 mL/hr.  -No new weight today.   11/7 - Dr. Johney Maine advanced TF via J-tube to Vital 1.2 @ 30 ml/hr today. - Per his note, wean TPN when patient tolerates TF at goal. - Patient continues with G-tube to LIWS (200 ml output past 24 hrs).  - Per Dr. Johney Maine, if drainage low/controlled  with Tfs at goal can go to gravity. - Per SLP evaluation yesterday, patient to remain NPO for now with plan to follow-up with MBS evaluation with patient stronger.  - Wife reports patient tolerated Vital 1.2 @ 20 ml/hr yesterday - denied N/V or abdominal pain.  - Also denies any constipation/diarrhea - patient had 2 bowel movements yesterday.  - TF: Vital 1.2 @ 30 ml/hr via J-tube with free water flushes of 20 ml Q4hrs.  - This regimen provides 864 kcal, 54 grams protein, 703 ml H2O.  - Goal TF rate is Vital 1.2 @ 65 ml/hr, which will provide 1872 kcal, 117 grams protein, 1265 ml H2O. - TPN: Currently Clinimix E 5/15 @ 83 ml/hr + 20% ILE @ 10 ml/hr. This regimen provides 1897 kcal, 100 grams protein, 1992 ml daily. - Per pharmacy note today plan to reduce Clinimix to E 5/20 @ 40 ml/hr today and continue 20% IlE @ 10 ml/hr. - This regimen will provide 1325 kcal, 48 grams protein, and 960 ml H2O. - Weight trend: -2.2 kg from yesterday   Diet Order:  Diet NPO time specified Except for: Ice Chips TPN (CLINIMIX-E) Adult  Skin:  Wound (see comment) (Abdominal incision)  Last BM:  11/7  Height:   Ht Readings from Last 1 Encounters:  12/08/15 _0  (1.88 m)    Weight:   Wt Readings from Last 1 Encounters:  12/24/15 182 lb 1.6 oz (82.6 kg)    Ideal Body Weight:  80.9 kg  BMI:  Body mass index is 23.38 kg/m.  Estimated Nutritional Needs:   Kcal:  1840-2090 (22-25 kcal/kg)  Protein:  100-117 grams (1.2-1.4 grams/kg)  Fluid:  per MD/NP/Surgery  EDUCATION NEEDS:   No education needs identified at this time    Jarome Matin, MS, RD, LDN Inpatient Clinical Dietitian Pager # [phone] After hours/weekend pager # [phone]

## 2015-12-24 NOTE — Progress Notes (Addendum)
[facility]  [address] [place], [place] [address] Phone: [phone] FAX: [phone]   [name] [id number] 1940-08-17  CARE TEAM:  PCP: Danny Shelling, Danny  Outpatient Care Team: Patient Care Team: Danny Orn, Danny as PCP - General (Internal Medicine) Danny Boston, Danny as Consulting Physician (General Surgery) Danny Silence, Danny as Consulting Physician (Gastroenterology)  Inpatient Treatment Team: Treatment Team: Attending Provider: Michael Boston, Danny; Consulting Physician: Danny Silence, Danny; Technician: [name], NT; Consulting Physician: Danny Pccm, Danny; Rounding Team: Danny Pccm, Danny; Chaplain: [name], Chaplain; Registered Nurse: Danny Lennert, RN; Physical Therapy Assistant: [name], PTA; Registered Nurse: Danny Parr, RN; Technician: [name], NT  SURGERY 12/03/2015  POST-OPERATIVE DIAGNOSIS:    Peritonitis Jejunal disruption Gastric leak  PROCEDURE:    LAPAROSCOPY DIAGNOSTIC OMENTOPEXY of jejunal disruption [place] OUT x 21L with drains placement  SURGEON:  Danny Boston, Danny   Post-Op 12/01/2015  POST-OPERATIVE DIAGNOSIS:    RECURRENT GASTRIC OUTLET OBSTRUCTION DUE to significant edema at gastroduodenal anastomosis. GASTRITIS Failure to thrive with malnutrition  PROCEDURE:   LAPAROSCOPIC PLACEMENT OF FEEDING JEJUNOSTOMY AND GASTROSTOMY TUBEs ESOPHAGOGASTRODUODENOSCOPY (EGD) BALLOON DILATION OF DUODENAL STRICTURE Gastric biopsies  Closure of gastrotomy  SURGEON:  Danny Boston, Danny  Prior surgery 09/25/2015  POST-OPERATIVE DIAGNOSIS:  Partial gastric outlet obstruction due to chronic pyloric stricture  PROCEDURE:   XI ROBOTIC distal gastrectomy BILROTH I  ANASTOMOSIS  ANTERIOR AND POSTERIOR VAGOTOMY DOR (Anterior 638 degree) FUNDIPLICATION OMENTOPEXY UPPER ENDOSCOPY  SURGEON:  Danny Boston, Danny   Problem List:   Principal Problem:   Bile  peritonitis due to gastric tube dislodgement Active Problems:   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 09/25/2015   Pneumatosis of intestines   Essential hypertension, benign   Hyperlipidemia   Anxiety state   Gastric outlet obstruction   Gastric Ileus    Encephalopathy acute   Endotracheally intubated   Protein-calorie malnutrition, severe   Acute respiratory failure with hypoxemia (HCC)   Tobacco abuse   Ischemic right index finger at tip   Gastritis   Gastric tube present (LUQ)   Jejunostomy tube present (LLQ)    Assessment/Plan:  RECOVERING   OK so far on floor.  RNs hopeful he is more calm & needs less intense care.  Return to SDU if fails  TF's via J tube at goal!  Wean off IV TNA since tolerating TFs at goal  G tube to gravity since lower output & less leaking.  LIWS if worse.  May try clamping trials if low output with TFs at goal.Site care.  Abd binder to cover tubes.  Hopefully as malnutrition & ileus resolve, will get easier.    Try to use G tube for meds since ileus seems to be resolving.  Follow off ABx.    Cultures negative = No more peritoneal drains.  Hopefully outside window of developing new intraperitoneal abscess.  CT scan if cannot tolerate or worsens to r/o worsening pneumatosis/hematoma/abscess/SBO.    Sedation/Anxiolysis with confusion.   More calm a hopeful sign with Haldol, but guarded w pulling out tubes x3 this admission.  Try switch to enteral Haldol QHS with IV backup.  Speech therapy reevaluate to rule out aspiration and see if he can try doing some drinking up to a pureed diet with the G-tube in place.  Deconditioned = guarded with PO attempts.  MBS eval in future when stronger (maybe with trach downsized).  Follow &  replace electrolytes (esp K) as needed  Able to pass endoscopy tube across the gastroduodenal Bilroth anastomosis & decrease G tube residuals signs that gastric outlet obstruction from inflammation is  improving.  Inflammatory narrowing at gastroduodenal Bilroth I anastomosis.  The fact that there is bilious reflux into the stomach argues against a complete obstruction.  Skin care.   F/u Bx's for Hpylori neg.  Carafate & PPI via Gtube as tolerated.    VTE prophylaxis - SCDs.  Hold off on enoxaparin if Hgb <8 or persistent bleeding.     Brachial artery embolus to right index finger resolved.   Finger tip embolic petechiae stable with sensation  Nicotene patch   Disposition: He will definitely require skilled care facility with rehabilitation capacity upon discharge from the hospital.  I suspect it's going to take until at least next week before he'll be ready for that transition.  FL2 cosigned  I discussed operative findings, updated the patient's status, discussed probable steps to recovery, and gave postoperative recommendations to the patient. (daughter).    ICU RNs.  Discussion with critical-care nursing staff.  Recommendations were made.  Questions were answered.  He expressed understanding & appreciation.    [name], M.D., F.A.C.S. Gastrointestinal and Minimally Invasive Surgery [facility] [address], [name], [place] [address] [phone] Main / Paging    12/24/2015   Subjective:  At floor bed near RN station. Denies much pain  Objective:  Vital signs:  Vitals:   12/24/15 0149 12/24/15 0344 12/24/15 0542 12/24/15 0731  BP: (!) 130/52  131/69   Pulse: (!) 108 (!) 111 (!) 110   Resp: 18 20 20    Temp: 98.2 F (36.8 C)  98.2 F (36.8 C)   TempSrc: Oral  Oral   SpO2: 99% 98% 98%   Weight:    82.6 kg (182 lb 1.6 oz)  Height:        Last BM Date: 12/22/15  Intake/Output   Yesterday:  11/08 0701 - 11/09 0700 In: 1010 [I.V.:550; NG/GT:410] Out: 1825 [Urine:675; Drains:1150] This shift:  No intake/output data recorded.  Bowel function:  Flatus: YES  BM:  YES  Gtube: thin light tan - no blood.  Leaking around  skin less Jtube site: cleaner  Physical Exam:  General: Pt awake. Less confused.  Tired.  No acute distress.  Interacting with staff Eyes: PERRL, normal EOM.  Sclera clear.  No icterus Neuro: CN II-XII intact w/o focal sensory/motor deficits. Lymph: No head/neck/groin lymphadenopathy Psych:  No delerium/psychosis/paranoia HENT: Normocephalic, Mucus membranes moist.  No thrush. ETT in place.  Less secretions Neck: Supple, No tracheal deviation Chest: Chest wall pain w good excursion.  No wheezing. CV:  Pulses intact.  Regular rhythm MS: Normal AROM mjr joints.  No obvious deformity Abdomen:  G tube LUQ.  Rash around skin fair with less drainage  J tube in LLQ - skin irritated but stable  Edema left flank.   Softer.  Nondistended.  Nontender.  No guarding GU:  Foley in place.  NEMG Ext:  SCDs BLE.  No mjr edema.  Right index finger pad with purple demarcation but + sensation.  Nail bed improved to nail bed.  PICC LUE clean Skin: No more anasarca.  No other petechiae / purpura  Results:   Diagnosis Stomach, biopsy - CHRONIC FOCALLY ACTIVE GASTRITIS. - THERE IS NO EVIDENCE OF HELICOBACTER PYLORI, DYSPLASIA OR MALIGNANCY. - SEE COMMENT. Microscopic Comment A Warthin-Starry stain is negative for the  presence of Helicobacter pylori organisms. (JBK:gt, 12/04/15) Enid Cutter Danny Pathologist, Electronic Signature (Case signed 12/04/2015) Specimen Marigene Erler and Clinical Information Specimen(s) Obtained: Stomach, biopsy Specimen Clinical  Labs: Results for orders placed or performed during the hospital encounter of 11/24/15 (from the past 48 hour(s))  Glucose, capillary     Status: Abnormal   Collection Time: 12/22/15  8:03 AM  Result Value Ref Range   Glucose-Capillary 124 (H) 65 - 99 mg/dL  Glucose, capillary     Status: Abnormal   Collection Time: 12/22/15 12:04 PM  Result Value Ref Range   Glucose-Capillary 102 (H) 65 - 99 mg/dL  Glucose, capillary     Status: Abnormal   Collection  Time: 12/22/15  6:36 PM  Result Value Ref Range   Glucose-Capillary 117 (H) 65 - 99 mg/dL   Comment 1 Notify RN   Glucose, capillary     Status: Abnormal   Collection Time: 12/22/15 11:20 PM  Result Value Ref Range   Glucose-Capillary 106 (H) 65 - 99 mg/dL  Basic metabolic panel     Status: Abnormal   Collection Time: 12/23/15  4:48 AM  Result Value Ref Range   Sodium 149 (H) 135 - 145 mmol/L   Potassium 3.8 3.5 - 5.1 mmol/L   Chloride 120 (H) 101 - 111 mmol/L   CO2 23 22 - 32 mmol/L   Glucose, Bld 129 (H) 65 - 99 mg/dL   BUN 48 (H) 6 - 20 mg/dL   Creatinine, Ser 0.92 0.61 - 1.24 mg/dL   Calcium 7.8 (L) 8.9 - 10.3 mg/dL   GFR calc non Af Amer >60 >60 mL/min   GFR calc Af Amer >60 >60 mL/min    Comment: (NOTE) The eGFR has been calculated using the CKD EPI equation. This calculation has not been validated in all clinical situations. eGFR's persistently <60 mL/min signify possible Chronic Kidney Disease.    Anion gap 6 5 - 15  Glucose, capillary     Status: Abnormal   Collection Time: 12/23/15  5:45 AM  Result Value Ref Range   Glucose-Capillary 122 (H) 65 - 99 mg/dL  Glucose, capillary     Status: Abnormal   Collection Time: 12/23/15 12:05 PM  Result Value Ref Range   Glucose-Capillary 120 (H) 65 - 99 mg/dL  Glucose, capillary     Status: Abnormal   Collection Time: 12/23/15  6:02 PM  Result Value Ref Range   Glucose-Capillary 126 (H) 65 - 99 mg/dL  Glucose, capillary     Status: Abnormal   Collection Time: 12/23/15 11:51 PM  Result Value Ref Range   Glucose-Capillary 131 (H) 65 - 99 mg/dL  Magnesium     Status: None   Collection Time: 12/24/15  4:35 AM  Result Value Ref Range   Magnesium 2.2 1.7 - 2.4 mg/dL  Phosphorus     Status: None   Collection Time: 12/24/15  4:35 AM  Result Value Ref Range   Phosphorus 3.4 2.5 - 4.6 mg/dL  Comprehensive metabolic panel     Status: Abnormal   Collection Time: 12/24/15  4:35 AM  Result Value Ref Range   Sodium 152 (H) 135 -  145 mmol/L   Potassium 3.8 3.5 - 5.1 mmol/L   Chloride 121 (H) 101 - 111 mmol/L   CO2 23 22 - 32 mmol/L   Glucose, Bld 140 (H) 65 - 99 mg/dL   BUN 58 (H) 6 - 20 mg/dL   Creatinine, Ser 1.19 0.61 - 1.24 mg/dL  Calcium 8.0 (L) 8.9 - 10.3 mg/dL   Total Protein 6.2 (L) 6.5 - 8.1 g/dL   Albumin 1.6 (L) 3.5 - 5.0 g/dL   AST 43 (H) 15 - 41 U/L   ALT 44 17 - 63 U/L   Alkaline Phosphatase 305 (H) 38 - 126 U/L   Total Bilirubin 1.1 0.3 - 1.2 mg/dL   GFR calc non Af Amer 58 (L) >60 mL/min   GFR calc Af Amer >60 >60 mL/min    Comment: (NOTE) The eGFR has been calculated using the CKD EPI equation. This calculation has not been validated in all clinical situations. eGFR's persistently <60 mL/min signify possible Chronic Kidney Disease.    Anion gap 8 5 - 15  Glucose, capillary     Status: Abnormal   Collection Time: 12/24/15  5:40 AM  Result Value Ref Range   Glucose-Capillary 131 (H) 65 - 99 mg/dL    Imaging / Studies: No results found.  Medications / Allergies: per chart  Antibiotics: Anti-infectives    Start     Dose/Rate Route Frequency Ordered Stop   12/09/15 1200  anidulafungin (ERAXIS) 100 mg in sodium chloride 0.9 % 100 mL IVPB     100 mg over 90 Minutes Intravenous Every 24 hours 12/09/15 0918 12/10/15 1333   12/05/15 1200  anidulafungin (ERAXIS) 100 mg in sodium chloride 0.9 % 100 mL IVPB  Status:  Discontinued     100 mg over 90 Minutes Intravenous Every 24 hours 12/04/15 1045 12/09/15 0835   12/04/15 1200  anidulafungin (ERAXIS) 200 mg in sodium chloride 0.9 % 200 mL IVPB     200 mg over 180 Minutes Intravenous  Once 12/04/15 1045 12/04/15 1725   12/04/15 0600  cefoTEtan (CEFOTAN) 2 g in dextrose 5 % 50 mL IVPB     2 g 100 mL/hr over 30 Minutes Intravenous On call to O.R. 12/03/15 1821 12/03/15 2007   12/04/15 0400  piperacillin-tazobactam (ZOSYN) IVPB 3.375 g  Status:  Discontinued     3.375 g 12.5 mL/hr over 240 Minutes Intravenous Every 8 hours 12/03/15 2229  12/15/15 0751   12/02/15 0000  erythromycin ethylsuccinate (EES) 200 MG/5ML suspension 400 mg  Status:  Discontinued    Comments:  Administer via J tube (16Fr LLQ)   400 mg Oral Every 6 hours 12/01/15 2020 12/03/15 0748   11/27/15 1200  erythromycin 500 mg in sodium chloride 0.9 % 100 mL IVPB     500 mg 100 mL/hr over 60 Minutes Intravenous Every 6 hours 11/27/15 0940 11/29/15 0608   11/26/15 0830  erythromycin 250 mg in sodium chloride 0.9 % 100 mL IVPB  Status:  Discontinued     250 mg 100 mL/hr over 60 Minutes Intravenous Every 6 hours 11/26/15 0751 11/27/15 0940        Note: Portions of this report may have been transcribed using voice recognition software. Every effort was made to ensure accuracy; however, inadvertent computerized transcription errors may be present.   Any transcriptional errors that result from this process are unintentional.    [name], M.D., F.A.C.S. Gastrointestinal and Minimally Invasive Surgery [facility] [address] [place], [place] [address] [phone] Main / Paging     12/24/2015

## 2015-12-24 NOTE — Progress Notes (Signed)
Physical Therapy Treatment Patient Details Name: Danny GalRalph T Utley MRN: [id number] DOB: 06-Jan-1941 Today's Date: 12/24/2015    History of Present Illness 75 y/o male with peptic ulcer disease admitted on 10/10 with recurrent gastric outlet obstruction secondary to gastritis admitted underwent G and J tube placement this admission.  On 10/19 had to go back to the OR after he removed his G tube accidentally leading to peritonitis, intubation, septic shock. Rt index finger ischemia likely from Rt brachial a line. Traccheostomy 12/11/15. Trial  off of vent /on Trach collar 10/30 until trip to endo then back on vent..      PT Comments    Patient was seen in bed upon arrival. Performed supine to si x modA +2 t with pt providing increased assistance to move LE's in bed and raise trunk. Demonstrated increased ability to maintain sitting balance. Sit to stand x modA +2 with increased ability to rise from an elevated surface. Requires VC's to perform controlled descent and to wait for recliner to get behind him. Gait x 3 ft x 12 ft using the bilateral platform walker.  Patient performed 3 ft to the door and 12 ft in the hallway x modA with +2 side by side assist. Requires VC's to maintain upright posture and to keep head up during ambulation. Patient is unable to perform increased gait distances d/t medical complications and increased hospital stay. Patient is limited by decreased activity tolerance, decreased endurance, and weakness.   Follow Up Recommendations  SNF;LTACH;Supervision/Assistance - 24 hour     Equipment Recommendations  None recommended by PT    Recommendations for Other Services       Precautions / Restrictions Precautions Precautions: Fall Precaution Comments:  J tube, G tube, PEG. multiple lines, trach collar 6 lts 28% very weak legs Restrictions Weight Bearing Restrictions: No    Mobility  Bed Mobility Overal bed mobility: Needs Assistance Bed Mobility: Supine to  Sit Rolling: +2 for physical assistance;Mod assist   Supine to sit: Mod assist;Max assist     General bed mobility comments: increased ability to maintain sitting balance. Pt provided more assistance to move LE's over in bed and to rise trunk. Utilized bed pad to complete scooting.   Transfers Overall transfer level: Needs assistance Equipment used: Bilateral platform walker Transfers: Sit to/from Stand Sit to Stand: Mod assist;+2 physical assistance;From elevated surface         General transfer comment: + 2 side by side assist to stand with increased ability to self rise rise from elevated bed.  + 2 side by side assist to lower to recliner for control decend.    Ambulation/Gait Ambulation/Gait assistance: +2 physical assistance;+2 safety/equipment;Mod assist Ambulation Distance (Feet): 15 Feet (3 ft, 12 ft) Assistive device: Bilateral platform walker Gait Pattern/deviations: Step-to pattern;Decreased step length - right;Decreased step length - left;Trunk flexed Gait velocity: decreased Gait velocity interpretation: Below normal speed for age/gender General Gait Details: 3 ft to the door, then rolled patient out into hallway to ambulate.  +3 assist; +2 side by side assist during ambulation with recliner following close behind.  Requires increased VC's to maintain upright posture and to keep head up. Decreased amb distance.    Stairs            Wheelchair Mobility    Modified Rankin (Stroke Patients Only)       Balance  Cognition Arousal/Alertness: Awake/alert Behavior During Therapy: WFL for tasks assessed/performed Overall Cognitive Status: Within Functional Limits for tasks assessed                      Exercises      General Comments        Pertinent Vitals/Pain Pain Assessment: No/denies pain Faces Pain Scale: No hurt    Home Living                      Prior Function             PT Goals (current goals can now be found in the care plan section) Progress towards PT goals: Progressing toward goals    Frequency    Min 3X/week      PT Plan Current plan remains appropriate    Co-evaluation     PT goals addressed during session: Mobility/safety with mobility       End of Session Equipment Utilized During Treatment: Oxygen;Gait belt Activity Tolerance: Treatment limited secondary to medical complications (Comment);Patient limited by fatigue Patient left: in chair;with call bell/phone within reach;with chair alarm set;with family/visitor present     Time: [id number] PT Time Calculation (min) (ACUTE ONLY): 30 min  Charges:  $Gait Training: 8-22 mins $Therapeutic Activity: 8-22 mins                    G Codes:      Marcelino ScotCaitlin Medlin, SPTA [facility] Acute Rehab [phone]  Present and agree with above  Felecia ShellingLori Rileyann Florance  PTA [facility]  Acute  Rehab Pager      [phone]

## 2015-12-24 NOTE — Clinical Social Work Note (Signed)
MSW just informed by [facility] that facility will not be able to admit patient due to ratio max for trach patients. Bed offer has been rescinded.   Currently patient does not have a SNF bed at this time.   MSW remains available as needed.   [name], MSW [phone] 12/24/2015 4:05 PM

## 2015-12-24 NOTE — Progress Notes (Signed)
Patient noted to have blood pressure of 80/60 manually with heart rate of 107.  Patient c/o dizziness and being tired.  Rapid response notified and Dr Michaell CowingGross paged.  Patient currently in recliner after Physical Therapy.

## 2015-12-24 NOTE — Clinical Social Work Note (Signed)
MSW met with patient's wife, Carlisle Cater in regards to discharge planning and presented patient's only bed offer: [facility], facility is able to manage trach care.  MSW provided wife with facility address and phone number as she plans to tour the facility today.   MSW will continue to follow patient and patient's family for continued support and to facilitate patient's discharge needs.   Glendon Axe, MSW [phone] 12/24/2015 11:23 AM

## 2015-12-24 NOTE — Significant Event (Signed)
Rapid Response Event Note  Overview:      Initial Focused Assessment:   Interventions:  Plan of Care (if not transferred): Give IVF, NS 500cc  Event Summary:   Called by bedside RN to evaluate patient for low BP. Upon arrival patient sitting up in chair. Bedside RN expressed he had just gotten up with PT and SBP was low at 87. Wife entered room and stated he gets upset with PT when they come for making everything jumbled while they are here. VS: BP 97/47(61), HR 104 SR with freq. PAC's, RR 30's, sats 100% TC. Bedside RN had increased fluids to 150cc/hr and was awaiting a call from Dr Michaell CowingGross. I suggested to give a 500cc bolus while waiting for Dr Michaell CowingGross to call.          Shelly Shoultz F

## 2015-12-25 DIAGNOSIS — Z930 Tracheostomy status: Secondary | ICD-10-CM

## 2015-12-25 MED ORDER — LACTATED RINGERS IV BOLUS (SEPSIS): 1000.0000 mL | Freq: Three times a day (TID) | INTRAVENOUS | Status: AC | PRN

## 2015-12-25 MED ORDER — ACETAMINOPHEN 325 MG PO TABS
325.0000 mg | ORAL_TABLET | Freq: Four times a day (QID) | ORAL | Status: DC | PRN
  Administered 2015-12-25: 650 mg via ORAL
  Filled 2015-12-25: qty 2

## 2015-12-25 MED ORDER — ACETAMINOPHEN 325 MG PO TABS
325.0000 mg | ORAL_TABLET | Freq: Four times a day (QID) | ORAL | Status: DC | PRN
  Administered 2015-12-26: 325 mg
  Filled 2015-12-25: qty 2

## 2015-12-25 MED ORDER — ACETAMINOPHEN 650 MG RE SUPP: 650.0000 mg | Freq: Four times a day (QID) | RECTAL | Status: DC | PRN

## 2015-12-25 MED ORDER — FENTANYL CITRATE (PF) 100 MCG/2ML IJ SOLN
12.5000 ug | INTRAMUSCULAR | Status: DC | PRN
  Administered 2015-12-25: 25 ug via INTRAVENOUS
  Administered 2015-12-25: 12.5 ug via INTRAVENOUS
  Administered 2015-12-25: 25 ug via INTRAVENOUS
  Administered 2015-12-26 – 2015-12-27 (×6): 12.5 ug via INTRAVENOUS
  Administered 2015-12-28 (×4): 25 ug via INTRAVENOUS
  Administered 2015-12-28 (×2): 12.5 ug via INTRAVENOUS
  Administered 2015-12-29 (×2): 25 ug via INTRAVENOUS
  Filled 2015-12-25 (×17): qty 2

## 2015-12-25 NOTE — Progress Notes (Signed)
Nutrition Follow-up  DOCUMENTATION CODES:   Severe malnutrition in context of acute illness/injury  INTERVENTION:  - Continue Vital 1.2 @ 65 mL/hr.  - RD will follow-up 11/12 for plan concerning possible transition to PEG feedings.   NUTRITION DIAGNOSIS:   Inadequate oral intake related to inability to eat as evidenced by NPO status. -ongoing  GOAL:   Patient will meet greater than or equal to 90% of their needs -met with TF alone  MONITOR:   TF tolerance, Weight trends, Labs, Skin, I & O's  ASSESSMENT:   75 year old male presenting for a post-operative visit. Note for "Post-Operative": Patient returns one month status post robotic resection.  Distal gastrectomy with Billroth I gastroduodenal reconstruction for chronic ulcer causing obstruction.  09/25/2015  11/10 TPN d/c'ed yesterday PM. Pt continues with goal rate TF via J-tube: Vital 1.2 @ 65 mL/hr. Wound care/dressing changes being performed at time of RD visit. No family present at that time and CCM MD and CCM NP entering pt's room for assessment at time of RD visit. No new weight since yesterday; will continue to monitor weight trend now that TPN d/c'ed.  Per Dr. Johney Maine' note this AM: G tube clamping trial since lower output & less leaking.  LIWS if worse.  Site care.  Abd binder to cover tubes.  Hopefully as malnutrition & ileus resolve, will get easier. Try to use G tube for meds since ileus seems to be resolving.  If tolerates clamping, may transition to bolus TF via G tube & wean off J tube continuous feeds  Will have RD working on 11/12 follow-up that date to assess possibility of transitioning to G-tube feedings. Once TF able to be provided via G-tube, likely to switch to a polymeric TF formula and will have the ability to add protein modulars if needed via that route.   Medications reviewed; PRN Maalox, 10 mg Dulcolax/day, PRN IV Reglan, PRN IV Zofran, PRN IV Compazine.  No labs since yesterday.    11/9 - Pt now at  recommended goal rate for TF: Vital 1.2 @ 65 mL/hr which is providing 1872 kcal, 117 grams of protein, and 1265 mL free water.  - Pt denies abdominal pain/pressure or nausea with TF at goal rate.  - Spoke with Pharmacist via phone who reports hopeful to be able to d/c TPN this afternoon or this evening. - Spoke with pt's daughter and wife, who are at bedside, as pt nods and shakes head but does not use verbal communication at time of RD visit.  - Daughter states that pt has been having fairly frequent BMs and asks if this is normal or concerning.  - Talked with family about provision of TF and that with GI motility improving with improvement of ileus more frequent BMs to be expected and are not concerning from a nutrition-based standpoint. - Daughter reports that pt is gaining strength and his coloration is much improved but he is still very weak.  - She asks about a time frame for regaining strength; deferred this to Surgeon.  - Daughter also asks about ability to repeat swallow evaluation; again deferred to Surgeon and his coordination with SLP.  - Weight +1.9 kg from 11/7 and now consistent with weight on 11/6.    11/8 -  He is currently receiving Vital 1.2 @ 40 mL/hr via J-tube with order to increase by 10 mL every 8 hours to reach surgeon-appointed TF goal of Vital 1.2 @ 70 mL/hr. - Vital 1.2 @ 50 mL/hr  will provide 1440 kcal, 90 grams of protein, and 973 mL free water.  - Vital 1.2 @ 70 mL/hr will provide 2016 kcal, 126 grams of protein (108% max estimated protein need), and 1364 mL free water.  - At time of visit, pt reports severe back pain.  - He denies nausea at time of visit or since TF rate increase from 30 mL/hr to 40 mL around 0800 this AM.  - Pt states that he is having internal abdominal pain but states that this did not occur until staff turned him late AM.  - Per Dr. Johney Maine' not this AM, plan to begin using G-tube for medication administration as ileus appears to be resolving.   - Note indicates that abdominal binder remains in place. - Spoke with Pharmacist who reports plan to maintain TPN regimen from yesterday: Clinimix E 5/15 @ 40 mL/hr with 20% ILE @ 10 mL/hr.  - This regimen provides 1325 kcal, 48 grams of protein.  - If medically feasible, recommend d/c TPN tomorrow as pt will be meeting >75% estimated nutrition needs with TF once TF increased to 50 mL/hr.  -No new weight today.   Diet Order:  Diet NPO time specified Except for: Ice Chips  Skin:  Wound (see comment) (Abdominal incision)  Last BM:  11/9  Height:   Ht Readings from Last 1 Encounters:  12/08/15 _0  (1.88 m)    Weight:   Wt Readings from Last 1 Encounters:  12/24/15 182 lb 1.6 oz (82.6 kg)    Ideal Body Weight:  80.9 kg  BMI:  Body mass index is 23.38 kg/m.  Estimated Nutritional Needs:   Kcal:  1840-2090 (22-25 kcal/kg)  Protein:  100-117 grams (1.2-1.4 grams/kg)  Fluid:  per MD/NP/Surgery  EDUCATION NEEDS:   No education needs identified at this time    Jarome Matin, MS, RD, LDN Inpatient Clinical Dietitian Pager # [phone] After hours/weekend pager # [phone]

## 2015-12-25 NOTE — Progress Notes (Signed)
Physical Therapy Treatment Patient Details Name: Danny GalRalph T Klosinski MRN: [id number] DOB: 02/08/1941 Today's Date: 12/25/2015    History of Present Illness 75 y/o male with peptic ulcer disease admitted on 10/10 with recurrent gastric outlet obstruction secondary to gastritis admitted underwent G and J tube placement this admission.  On 10/19 had to go back to the OR after he removed his G tube accidentally leading to peritonitis, intubation, septic shock. Rt index finger ischemia likely from Rt brachial a line. Traccheostomy 12/11/15. Trial  off of vent /on Trach collar 10/30 until trip to endo then back on vent..      PT Comments    Assisted OOB while monitoring BP's. Supine 113/85 EOB 120/68 Remained on trach collar 6lts 28% sats maintained above 90%, HR 98 at rest but would increase to 130's with noted increased RR and anxiety with amb.  VC's to slow RR and comfort/ensure pt everything looks good.  + 2 assist side by side for safety/security and 3rd assist closely following with recliner.   Pt amb 12 feet than assisted to sit when a large amount of drainage was noted coming from G TUGE site.  RN called to room and assisted with dressing change.  Drainage all over ABD binder and gown.     Follow Up Recommendations  SNF;LTACH;Supervision/Assistance - 24 hour     Equipment Recommendations       Recommendations for Other Services       Precautions / Restrictions Precautions Precautions: Fall Precaution Comments:  J tube, G tube, PEG. multiple lines, trach collar 6 lts 28% very weak legs Restrictions Weight Bearing Restrictions: No    Mobility  Bed Mobility Overal bed mobility: Needs Assistance Bed Mobility: Supine to Sit     Supine to sit: Mod assist     General bed mobility comments: + 1 Mod Assist with use of rail and increased time.  Increased ability to self perform  Transfers Overall transfer level: Needs assistance Equipment used: Bilateral platform  walker Transfers: Sit to/from Stand Sit to Stand: Min assist;Mod assist;+2 safety/equipment         General transfer comment: + 2 side by side assist to stand with increased ability to self rise rise from elevated bed.  + 2 side by side assist to lower to recliner for control decend.    Ambulation/Gait Ambulation/Gait assistance: Mod assist;+2 physical assistance;+2 safety/equipment Ambulation Distance (Feet): 12 Feet Assistive device: Bilateral platform walker Gait Pattern/deviations: Step-to pattern;Decreased step length - right;Decreased step length - left Gait velocity: decreased   General Gait Details: pt tolerated amb an increased distance on one attempt.  wanted to amb again but with stand to sit much drainage was oozing out of pt G tube site.  RN called to room.     Stairs            Wheelchair Mobility    Modified Rankin (Stroke Patients Only)       Balance                                    Cognition Arousal/Alertness: Awake/alert Behavior During Therapy: WFL for tasks assessed/performed Overall Cognitive Status: Within Functional Limits for tasks assessed                      Exercises      General Comments        Pertinent Vitals/Pain  Home Living                      Prior Function            PT Goals (current goals can now be found in the care plan section) Progress towards PT goals: Progressing toward goals    Frequency    Min 3X/week      PT Plan Current plan remains appropriate    Co-evaluation             End of Session Equipment Utilized During Treatment: Oxygen;Gait belt Activity Tolerance: Treatment limited secondary to medical complications (Comment) Patient left: in chair;with call bell/phone within reach;with chair alarm set;with family/visitor present     Time: 1335-1405 PT Time Calculation (min) (ACUTE ONLY): 30 min  Charges:  $Gait Training: 8-22 mins $Therapeutic  Activity: 8-22 mins                    G Codes:      Felecia ShellingLori Daleysa Kristiansen  PTA [facility]  Acute  Rehab Pager      [phone]

## 2015-12-25 NOTE — Progress Notes (Signed)
Rt changed out trach per MD order. Pt went from a #6 cuffed to a #6 cuffless. Pt has equal bs, good color change on ez-cap, vitals stable. All back up equipment in room and family was at bed side.

## 2015-12-25 NOTE — Progress Notes (Signed)
Speech Language Pathology Treatment: Hillary BowPassy Muir Speaking valve  Patient Details Name: [name] MRN: [id number] DOB: [birth year] Today's Date: 12/25/2015 Time: [id number] SLP Time Calculation (min) (ACUTE ONLY): 15 min  Assessment / Plan / Recommendation Clinical Impression  Pt seen for dysphagia and tx for PMSV with wife present. SLP donned valve which pt tolerated for 15 minutes with adequate respirations and no indications of back pressure. Vocal quality clear with lower intensity. Therapeutic exercise to strengthen laryngeal elevation, cough, throat clear and vocal cord adduction (pitch modulation, hard coughs, "hut" and throat clears) with min verbal cues and demonstration. Discussed repeat MBS most likely next week- primary SLP who is more familiar with pt can discuss with pt/wife most appropriate time to repeat MBS. Encouraged pt to practice exercises.    HPI HPI: 75 yo male former smoker with progressive epigastric pain, nausea, vomiting, bloating from gastric outlet obstruction with pyloric channel ulcer.  Had Billroth 1, vagotomy, fundoplication, omentopexy August 2017 with initial improvement.  Had recurrent symptoms September 2017 that become progressively worse with gastric dilation from recurrent stricture.  Had laparoscopic placement of jejunostomy tube, gastrostomy tube, and EGD balloon dilation of duodenal stricture 10/17.  Developed gastric leakage with peritonitis after pulling out PEG tube 10/19 and taken to OR. On 10/27 found 2 new LUQ abscesses/fluid collections which were drained percutaneously by IR. Had perc trach 10/27.  As of 10/30 his mental status has improved, he has remained off of vasopressors for several days and he has done well with weaning off the ventilator per MD note.       SLP Plan  Continue with current plan of care     Recommendations         Patient may use Passy-Muir Speech Valve: During all therapies with supervision;Caregiver trained to provide  supervision PMSV Supervision: Full MD: Please consider changing trach tube to : Cuffless         Oral Care Recommendations: Oral care prior to ice chip/H20 Follow up Recommendations: Skilled Nursing facility;LTACH Plan: Continue with current plan of care       GO                Royce MacadamiaLitaker, Grabiela Wohlford Willis 12/25/2015, 4:44 PM Breck CoonsLisa Willis Lonell FaceLitaker M.Ed [employer]P Pager [phone]

## 2015-12-25 NOTE — Progress Notes (Signed)
[facility]  [address] [place], [place] [address] Phone: [phone] FAX: [phone]   HANK WALLING [id number] 1940/03/20  CARE TEAM:  PCP: Irven Shelling, MD  Outpatient Care Team: Patient Care Team: Lavone Orn, MD as PCP - General (Internal Medicine) Michael Boston, MD as Consulting Physician (General Surgery) Arta Silence, MD as Consulting Physician (Gastroenterology)  Inpatient Treatment Team: Treatment Team: Attending Provider: Michael Boston, MD; Consulting Physician: Arta Silence, MD; Technician: Sueanne Margarita, NT; Consulting Physician: Md Pccm, MD; Rounding Team: Md Pccm, MD; Chaplain: Hadley Pen, Chaplain; Registered Nurse: Benny Lennert, RN; Physical Therapy Assistant: Diego Cory, PTA; Registered Nurse: Sibyl Parr, RN; Technician: Leda Quail, NT; Registered Nurse: Verlon Au, RN  SURGERY 12/03/2015  POST-OPERATIVE DIAGNOSIS:    Peritonitis Jejunal disruption Gastric leak  PROCEDURE:    LAPAROSCOPY DIAGNOSTIC OMENTOPEXY of jejunal disruption [place] OUT x 21L with drains placement  SURGEON:  Michael Boston, MD   Post-Op 12/01/2015  POST-OPERATIVE DIAGNOSIS:    RECURRENT GASTRIC OUTLET OBSTRUCTION DUE to significant edema at gastroduodenal anastomosis. GASTRITIS Failure to thrive with malnutrition  PROCEDURE:   LAPAROSCOPIC PLACEMENT OF FEEDING JEJUNOSTOMY AND GASTROSTOMY TUBEs ESOPHAGOGASTRODUODENOSCOPY (EGD) BALLOON DILATION OF DUODENAL STRICTURE Gastric biopsies  Closure of gastrotomy  SURGEON:  Michael Boston, MD  Prior surgery 09/25/2015  POST-OPERATIVE DIAGNOSIS:  Partial gastric outlet obstruction due to chronic pyloric stricture  PROCEDURE:   XI ROBOTIC distal gastrectomy BILROTH I  ANASTOMOSIS  ANTERIOR AND POSTERIOR VAGOTOMY DOR (Anterior 361 degree) FUNDIPLICATION OMENTOPEXY UPPER ENDOSCOPY  SURGEON:  Michael Boston, MD   Problem  List:   Principal Problem:   Bile peritonitis due to gastric tube dislodgement Active Problems:   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 09/25/2015   Pneumatosis of intestines   Essential hypertension, benign   Hyperlipidemia   Anxiety state   Gastric outlet obstruction   Gastric Ileus    Encephalopathy acute   Endotracheally intubated   Protein-calorie malnutrition, severe   Acute respiratory failure with hypoxemia (HCC)   Tobacco abuse   Ischemic right index finger at tip   Gastritis   Gastric tube present (LUQ)   Jejunostomy tube present (LLQ)    Assessment/Plan:  RECOVERING   OK so far on floor.  RNs hopeful he is more calm & needs less intense care.  Return to SDU if fails  TF's via J tube at goal!  Wean off IV TNA since tolerating TFs at goal  G tube clamping trial since lower output & less leaking.  LIWS if worse.  Site care.  Abd binder to cover tubes.  Hopefully as malnutrition & ileus resolve, will get easier.    Try to use G tube for meds since ileus seems to be resolving.  If tolerates clamping, may transition to bolus TF via G tube & wean off J tube continuous feeds  Downsize trach 11/10 = today hopefully  Speech therapy reevaluate to rule out aspiration and see if he can try doing some drinking up to a pureed diet with the G-tube in place.  Deconditioned = guarded with PO attempts.  MBS eval in future when stronger (maybe with trach downsized).  Sedation/Anxiolysis with confusion.   More calm a hopeful sign with Haldol, but guarded w pulling out tubes x3 this admission.  Try switch to enteral Haldol QHS with IV backup.  Follow off ABx.    Cultures negative = No more peritoneal drains.  Hopefully outside window  of developing new intraperitoneal abscess.  CT scan if cannot tolerate or worsens to r/o worsening pneumatosis/hematoma/abscess/SBO.     Follow & replace electrolytes (esp K) as needed  Able to pass endoscopy tube across the  gastroduodenal Bilroth anastomosis & decrease G tube residuals signs that gastric outlet obstruction from inflammation is improving.  Inflammatory narrowing at gastroduodenal Bilroth I anastomosis.  The fact that there is bilious reflux into the stomach argues against a complete obstruction.  Skin care.   F/u Bx's for Hpylori neg.  Carafate & PPI via Gtube as tolerated.    VTE prophylaxis - SCDs.  Hold off on enoxaparin if Hgb <8 or persistent bleeding.     Brachial artery embolus to right index finger resolved.   Finger tip embolic petechiae stable with sensation  Nicotene patch   Disposition: He will definitely require skilled care facility with rehabilitation capacity upon discharge from the hospital.  I suspect it's going to take until at least next week before he'll be ready for that transition.  FL2 cosigned  I discussed operative findings, updated the patient's status, discussed probable steps to recovery, and gave postoperative recommendations to the patient. (daughter).    ICU RNs.  Discussion with critical-care nursing staff.  Recommendations were made.  Questions were answered.  He expressed understanding & appreciation.    [name], M.D., F.A.C.S. Gastrointestinal and Minimally Invasive Surgery [facility] [address], [place], [place] [address] [phone] Main / Paging    12/25/2015   Subjective:  At floor bed near RN station. Denies pain Daughter at bedside No leaking or high output with G tube to gravity  Objective:  Vital signs:  Vitals:   12/24/15 2150 12/24/15 2315 12/25/15 0342 12/25/15 0530  BP: (!) 118/47   (!) 109/55  Pulse: (!) 102 96 (!) 104 96  Resp: 20 20 20 20   Temp: 97.7 F (36.5 C)   98.2 F (36.8 C)  TempSrc: Oral   Oral  SpO2: 99% 98% 96% 100%  Weight:      Height:        Last BM Date: 12/24/15  Intake/Output   Yesterday:  11/09 0701 - 11/10 0700 In: 1250.4 [NG/GT:760.4; IV  Piggyback:450] Out: 851 [Urine:500; Drains:350; Stool:1] This shift:  No intake/output data recorded.  Bowel function:  Flatus: YES  BM:  YES  Gtube: thin light tan - no blood.  Leaking around skin less Jtube site: cleaner  Physical Exam:  General: Pt awake but sleepy. Less confused.  Tired.  No acute distress.  Interacting with staff Eyes: PERRL, normal EOM.  Sclera clear.  No icterus Neuro: CN II-XII intact w/o focal sensory/motor deficits. Lymph: No head/neck/groin lymphadenopathy Psych:  No delerium/psychosis/paranoia.   HENT: Normocephalic, Mucus membranes moist.  No thrush. ETT in place.  Less secretions Neck: Supple, No tracheal deviation Chest: Chest wall pain w good excursion.  No wheezing. CV:  Pulses intact.  Regular rhythm MS: Normal AROM mjr joints.  No obvious deformity Abdomen:  G tube LUQ.  Rash around skin fair with less drainage  J tube in LLQ - skin irritated but stable  Edema left flank.   Softer.  Nondistended.  Nontender.  No guarding GU:  Foley in place.  NEMG Ext:  SCDs BLE.  No mjr edema.  Right index finger pad with purple demarcation but + sensation.  Nail bed improved to nail bed.  PICC LUE clean Skin: No more anasarca.  No other petechiae /  purpura  Results:   Diagnosis Stomach, biopsy - CHRONIC FOCALLY ACTIVE GASTRITIS. - THERE IS NO EVIDENCE OF HELICOBACTER PYLORI, DYSPLASIA OR MALIGNANCY. - SEE COMMENT. Microscopic Comment A Warthin-Starry stain is negative for the presence of Helicobacter pylori organisms. (JBK:gt, 12/04/15) Enid Cutter MD Pathologist, Electronic Signature (Case signed 12/04/2015) Specimen Moneisha Vosler and Clinical Information Specimen(s) Obtained: Stomach, biopsy Specimen Clinical  Labs: Results for orders placed or performed during the hospital encounter of 11/24/15 (from the past 48 hour(s))  Glucose, capillary     Status: Abnormal   Collection Time: 12/23/15 12:05 PM  Result Value Ref Range   Glucose-Capillary 120 (H) 65  - 99 mg/dL  Glucose, capillary     Status: Abnormal   Collection Time: 12/23/15  6:02 PM  Result Value Ref Range   Glucose-Capillary 126 (H) 65 - 99 mg/dL  Glucose, capillary     Status: Abnormal   Collection Time: 12/23/15 11:51 PM  Result Value Ref Range   Glucose-Capillary 131 (H) 65 - 99 mg/dL  Magnesium     Status: None   Collection Time: 12/24/15  4:35 AM  Result Value Ref Range   Magnesium 2.2 1.7 - 2.4 mg/dL  Phosphorus     Status: None   Collection Time: 12/24/15  4:35 AM  Result Value Ref Range   Phosphorus 3.4 2.5 - 4.6 mg/dL  Comprehensive metabolic panel     Status: Abnormal   Collection Time: 12/24/15  4:35 AM  Result Value Ref Range   Sodium 152 (H) 135 - 145 mmol/L   Potassium 3.8 3.5 - 5.1 mmol/L   Chloride 121 (H) 101 - 111 mmol/L   CO2 23 22 - 32 mmol/L   Glucose, Bld 140 (H) 65 - 99 mg/dL   BUN 58 (H) 6 - 20 mg/dL   Creatinine, Ser 1.19 0.61 - 1.24 mg/dL   Calcium 8.0 (L) 8.9 - 10.3 mg/dL   Total Protein 6.2 (L) 6.5 - 8.1 g/dL   Albumin 1.6 (L) 3.5 - 5.0 g/dL   AST 43 (H) 15 - 41 U/L   ALT 44 17 - 63 U/L   Alkaline Phosphatase 305 (H) 38 - 126 U/L   Total Bilirubin 1.1 0.3 - 1.2 mg/dL   GFR calc non Af Amer 58 (L) >60 mL/min   GFR calc Af Amer >60 >60 mL/min    Comment: (NOTE) The eGFR has been calculated using the CKD EPI equation. This calculation has not been validated in all clinical situations. eGFR's persistently <60 mL/min signify possible Chronic Kidney Disease.    Anion gap 8 5 - 15  Glucose, capillary     Status: Abnormal   Collection Time: 12/24/15  5:40 AM  Result Value Ref Range   Glucose-Capillary 131 (H) 65 - 99 mg/dL    Imaging / Studies: No results found.  Medications / Allergies: per chart  Antibiotics: Anti-infectives    Start     Dose/Rate Route Frequency Ordered Stop   12/09/15 1200  anidulafungin (ERAXIS) 100 mg in sodium chloride 0.9 % 100 mL IVPB     100 mg over 90 Minutes Intravenous Every 24 hours 12/09/15 0918  12/10/15 1333   12/05/15 1200  anidulafungin (ERAXIS) 100 mg in sodium chloride 0.9 % 100 mL IVPB  Status:  Discontinued     100 mg over 90 Minutes Intravenous Every 24 hours 12/04/15 1045 12/09/15 0835   12/04/15 1200  anidulafungin (ERAXIS) 200 mg in sodium chloride 0.9 % 200 mL IVPB  200 mg over 180 Minutes Intravenous  Once 12/04/15 1045 12/04/15 1725   12/04/15 0600  cefoTEtan (CEFOTAN) 2 g in dextrose 5 % 50 mL IVPB     2 g 100 mL/hr over 30 Minutes Intravenous On call to O.R. 12/03/15 1821 12/03/15 2007   12/04/15 0400  piperacillin-tazobactam (ZOSYN) IVPB 3.375 g  Status:  Discontinued     3.375 g 12.5 mL/hr over 240 Minutes Intravenous Every 8 hours 12/03/15 2229 12/15/15 0751   12/02/15 0000  erythromycin ethylsuccinate (EES) 200 MG/5ML suspension 400 mg  Status:  Discontinued    Comments:  Administer via J tube (16Fr LLQ)   400 mg Oral Every 6 hours 12/01/15 2020 12/03/15 0748   11/27/15 1200  erythromycin 500 mg in sodium chloride 0.9 % 100 mL IVPB     500 mg 100 mL/hr over 60 Minutes Intravenous Every 6 hours 11/27/15 0940 11/29/15 0608   11/26/15 0830  erythromycin 250 mg in sodium chloride 0.9 % 100 mL IVPB  Status:  Discontinued     250 mg 100 mL/hr over 60 Minutes Intravenous Every 6 hours 11/26/15 0751 11/27/15 0940        Note: Portions of this report may have been transcribed using voice recognition software. Every effort was made to ensure accuracy; however, inadvertent computerized transcription errors may be present.   Any transcriptional errors that result from this process are unintentional.    [name], M.D., F.A.C.S. Gastrointestinal and Minimally Invasive Surgery [facility] [address] [place], [place] [address] [phone] Main / Paging     12/25/2015

## 2015-12-25 NOTE — Progress Notes (Signed)
PCCM PROGRESS NOTE  Admission date: 11/24/2015 Consult date: 12/03/2015 Referring provider: Dr. Michaell Cowing  CC: Abdominal pain  BRIEF:  75 y/o male with peptic ulcer disease admitted on 10/10 with recurrent gastric outlet obstruction secondary to gastritis admitted underwent G and J tube placement this admission.  On 10/19 had to go back to the OR after he removed his G tube accidentally leading to peritonitis, intubation, septic shock.  Prolonged vent needs led to tracheostomy on 10/27 (DF).    Subjective:  Pt reports ongoing chronic back pain, mild abd pain.  Remains Afebrile, WBC 8.9 11/6.  RN reports chronic drainage from around G-tube   Vital signs: BP (!) 109/55 (BP Location: Right Arm)   Pulse (!) 112   Temp 98.2 F (36.8 C) (Oral)   Resp 20   Ht 6\' 2"  (1.88 m)   Wt 182 lb 1.6 oz (82.6 kg)   SpO2 99%   BMI 23.38 kg/m   Intake/output: I/O last 3 completed shifts: In: 1570.4 [I.V.:150; Other:40; NG/GT:930.4; IV Piggyback:450] Out: 2201 [Urine:900; Drains:1300; Stool:1]  FiO2 (%):  [28 %-30 %] 28 %  General: chronically ill appearing male in NAD HENT: NCAT, tracheostomy c/d/i, PMV in place, improved voice strength.  PULM: CTA B, normal effort CV: RRR, no mgr GI: BS+, G and J  in place, abdominal binder, G-tube with macerated skin surrounding site, thick yellow stringy drainage from site Derm: no rash, mild edema unchanged Neuro: awake, answers questions, globally weak but non-focal  CMP Latest Ref Rng & Units 12/24/2015 12/23/2015 12/22/2015  Glucose 65 - 99 mg/dL 454(U) 981(X) 914(N)  BUN 6 - 20 mg/dL 82(N) 56(O) 13(Y)  Creatinine 0.61 - 1.24 mg/dL 8.65 7.84 6.96  Sodium 135 - 145 mmol/L 152(H) 149(H) 148(H)  Potassium 3.5 - 5.1 mmol/L 3.8 3.8 3.3(L)  Chloride 101 - 111 mmol/L 121(H) 120(H) 117(H)  CO2 22 - 32 mmol/L 23 23 23   Calcium 8.9 - 10.3 mg/dL 8.0(L) 7.8(L) 8.1(L)  Total Protein 6.5 - 8.1 g/dL 6.2(L) - -  Total Bilirubin 0.3 - 1.2 mg/dL 1.1 - -  Alkaline Phos  38 - 126 U/L 305(H) - -  AST 15 - 41 U/L 43(H) - -  ALT 17 - 63 U/L 44 - -    CBC Latest Ref Rng & Units 12/21/2015 12/18/2015 12/15/2015  WBC 4.0 - 10.5 K/uL 8.9 10.6(H) 10.2  Hemoglobin 13.0 - 17.0 g/dL 2.9(B) 2.8(U) 1.3(K)  Hematocrit 39.0 - 52.0 % 25.9(L) 26.0(L) 26.4(L)  Platelets 150 - 400 K/uL 473(H) 573(H) 633(H)    Imaging: No results found.    STUDIES: 10/11  CT abdomen/pelvis> significant distension of the stomach with layering fluid and gas, NG present, worrisome for recurrent gastric outlet obstruction.  RLL consolidation, cholecycstectomy 10/26  CT abdomen/pelvis> Moderate dilatation of proximal and mid small bowel loops, with transition point in anterior right lower quadrant, suspicious for partial small bowel obstruction. Pneumatosis involving multiple dilated small bowel loops in the left abdomen pelvis, suspicious for bowel ischemia. No evidence of portal venous gas or free intraperitoneal air. Two left upper quadrant fluid collections adjacent to the stomach and spleen, which may represent postoperative fluid collections or abscesses. Small amount free fluid also noted in pelvis.Small bilateral pleural effusions and bibasilar atelectasis. Diffuse mesenteric and body wall edema. 11/02  Modified barium swallow> silent aspiration  CULTURES: Blood 10/19 >> neg Fluid cx 10/17 >> neg Fluid cx 10/27 >> neg  ANTIBIOTICS: Zosyn 10/19 >> off Anidulafungin 10/19 >> 10/26   EVENTS /  STUDIES: 10/10  Admit  10/17  Lap placement of J & G tubes, EGD w/ balloon dilation of duodenal stricture, bx, closure of gastrotomy 10/18  Sundowning, pulled out G tube 10/19  Lap findings of gastric leak, bilious ascites, jejunal disruption, omentoplexy of jejunal disruption  10/20  R hand discoloration, aline d/c'd, VVS consulted with rec's for heparin 10/24  Extubated. Required re-intubation.  10/25  Precedex for acute encephalopathy; new drainage from J tube. TF stopped.   10/26  Heparin  d/c'd by vascular. On pressors, albumin, lasix.  Developed rectal bleeding after starting bowel prep.  10/26  CT ABD >> evidence of bowel pneumotosis worrisome for ischemia, and two LUQ fluid collections 10/27  Trach placed. TO IR for Perc drains to be placed in LUQ 10/30  EGD >> mild esophagitis, evidence of billroth procedure, gastric pouch, non-bleeding duodenal ulcers, non-bleeding jejunal ulcers, could not reach j - tube 10/31 PMSV trial, pt tolerated well 11/01  24 hours on trach collar; PMV in place  LINES: 10/19 ETT >> 10/24 then replaced 10/24 > 10/27 10/19 CVL left subclavian >>  10/19 R brachial arterial line >> 10/20 10/19 abdominal drain x3 >> 10/19 G tube >> 10/19 J tube >> 10/27 Trach placed (feinstein # 6 cuffed), Abd drains by IR 10/29 J dislodged, replaced by IR 11/3 early AM pulled out J tube, replaced by surgery  SUMMARY: 75 yo male former smoker adm with gastric outlet obstruction with pyloric channel ulcer.  Had Billroth 1, vagotomy, fundoplication, omentopexy August 2017 with initial improvement.  Had recurrent symptoms September 2017 that become progressively worse with gastric dilation from recurrent stricture.  Had laparoscopic placement of jejunostomy tube, gastrostomy tube, and EGD balloon dilation of duodenal stricture 10/17.  Developed gastric leakage with peritonitis after pulling out PEG tube 10/19 and taken to OR. On 10/27 found 2 new LUQ abscesses/fluid collections which were drained percutaneously by IR. Had perc trach 10/27.  Course further complicated by rectal bleeding, emboli to R fingers requiring heparin gtt.     PMHx of CAD s/p stent, GERD, HTN, HLD  ASSESSMENT / PLAN:   Acute and chronic respiratory failure  Tracheostomy status.  - tracheostomy collar O2 supplementation to maintain O2 > 90% - PMSV in place when supervised - Cont routine trach care  - Change to #6 cuffless trach 11/10.  Favor keeping #6 for now given potential for vent need, no  downsize to #4 - would consider decannulation only when he is ambulatory  Recurrent gastric outlet obstruction GI bleeding - source inflamed small bowel? Resolved Abdominal abscess/fluid collections s/p IR drains Protein-calorie malnutrition Dislodged J overnight 11/3 - replaced - G and J tube management per surgery - nutrition per surgery, trickle feeds started - TPN per surgery - monitoring off antibiotics - SLP eval ongoing, MBS deferred  Hypertension with Sinus tachycardia - low dose metoprolol  AKI - Resolved Hypernatremia  Hyperchloremia  hypokalemia - replace electrolytes as needed  Acute metabolic encephalopathy, improved 11/4 Post-op pain Severe Physical deconditioning  - PRN moprhine for pain, can add tylenol #3 - haldol prn for severe agitation, reduce frequency  - continue scheduled QHS haldol 1 mg, monitor QTc on tele monitor.  Rt index finger ischemia likely from Rt brachial a line, improved, no clot on upper ext doppler ultrasound, good pulse - VVS consulted 10/20, appreciate input-->resolved  - no more arterial lines right arm  Hyperglycemia - SSI   SUP - Famotidine IV DVT prophylaxis - SCDs Goals of care -  Full code  PCCM will continue to follow intermittently for trach care.    Canary BrimBrandi Halston Fairclough, NP-C [facility] Pgr: [phone] or if no answer [phone] 12/25/2015, 9:21 AM

## 2015-12-26 LAB — BASIC METABOLIC PANEL
ANION GAP: 6 (ref 5–15)
BUN: 82 mg/dL — ABNORMAL HIGH (ref 6–20)
CO2: 23 mmol/L (ref 22–32)
Calcium: 7.6 mg/dL — ABNORMAL LOW (ref 8.9–10.3)
Chloride: 127 mmol/L — ABNORMAL HIGH (ref 101–111)
Creatinine, Ser: 1.44 mg/dL — ABNORMAL HIGH (ref 0.61–1.24)
GFR, EST AFRICAN AMERICAN: 53 mL/min — AB (ref >60)
GFR, EST NON AFRICAN AMERICAN: 46 mL/min — AB (ref >60)
Glucose, Bld: 139 mg/dL — ABNORMAL HIGH (ref 65–99)
POTASSIUM: 3.9 mmol/L (ref 3.5–5.1)
SODIUM: 156 mmol/L — AB (ref 135–145)

## 2015-12-26 MED ORDER — DEXTROSE 5 % IV SOLN
INTRAVENOUS | Status: DC
  Administered 2015-12-26 – 2015-12-28 (×5): via INTRAVENOUS

## 2015-12-26 NOTE — Progress Notes (Signed)
Dressing change done at GT and J tube sites per order. Moderate amount of yellow-green, odorous drainage noted.

## 2015-12-26 NOTE — Progress Notes (Signed)
Dressing change done to GT and J tube sites per order. Smaller amount of drainage noted as compared to earlier in shift. Drainage yellow and odorous.

## 2015-12-26 NOTE — Clinical Social Work Note (Signed)
Patient without bed offers for SNF placement. [facility].   Per MD, possible discharge next week.   MSW will continue to follow patient and family for continued support and to facilitate discharge needs once medically stable.   [name], MSW [phone] 12/26/2015 11:58 AM

## 2015-12-26 NOTE — Progress Notes (Signed)
Patient ID: [name], male   DOB: 11/29/1940, 75 y.o.   MRN: [id number] 12 Days Post-Op  Subjective: Alert, responsive, denies shortness of breath or abdominal pain.  Objective: Vital signs in last 24 hours: Temp:  [98.1 F (36.7 C)-98.5 F (36.9 C)] 98.3 F (36.8 C) (11/11 0605) Pulse Rate:  [74-115] 98 (11/11 0738) Resp:  [14-18] 16 (11/11 0738) BP: (107-132)/(54-101) 107/58 (11/11 0605) SpO2:  [94 %-99 %] 98 % (11/11 0738) FiO2 (%):  [28 %] 28 % (11/11 0738) Weight:  [78.4 kg (172 lb 13.5 oz)] 78.4 kg (172 lb 13.5 oz) (11/11 0605) Last BM Date: 12/25/15  Intake/Output from previous day: 11/10 0701 - 11/11 0700 In: 2114.6 [NG/GT:1614.6] Out: 1290 [Urine:1200; Drains:90] Intake/Output this shift: No intake/output data recorded.  General appearance: alert, cooperative and no distress Resp: No wheezing or increased work of breathing GI: normal findings: soft, non-tender and Nondistended Incision/Wound: Clean and dry. Slight bilious drainage around G-tube and J-tube  Lab Results:  No results for input(s): WBC, HGB, HCT, PLT in the last 72 hours. BMET  Recent Labs  12/24/15 0435 12/26/15 0500  NA 152* 156*  K 3.8 3.9  CL 121* 127*  CO2 23 23  GLUCOSE 140* 139*  BUN 58* 82*  CREATININE 1.19 1.44*  CALCIUM 8.0* 7.6*     Studies/Results: No results found.  Anti-infectives: Anti-infectives    Start     Dose/Rate Route Frequency Ordered Stop   12/09/15 1200  anidulafungin (ERAXIS) 100 mg in sodium chloride 0.9 % 100 mL IVPB     100 mg over 90 Minutes Intravenous Every 24 hours 12/09/15 0918 12/10/15 1333   12/05/15 1200  anidulafungin (ERAXIS) 100 mg in sodium chloride 0.9 % 100 mL IVPB  Status:  Discontinued     100 mg over 90 Minutes Intravenous Every 24 hours 12/04/15 1045 12/09/15 0835   12/04/15 1200  anidulafungin (ERAXIS) 200 mg in sodium chloride 0.9 % 200 mL IVPB     200 mg over 180 Minutes Intravenous  Once 12/04/15 1045 12/04/15 1725   12/04/15 0600  cefoTEtan (CEFOTAN) 2 g in dextrose 5 % 50 mL IVPB     2 g 100 mL/hr over 30 Minutes Intravenous On call to O.R. 12/03/15 1821 12/03/15 2007   12/04/15 0400  piperacillin-tazobactam (ZOSYN) IVPB 3.375 g  Status:  Discontinued     3.375 g 12.5 mL/hr over 240 Minutes Intravenous Every 8 hours 12/03/15 2229 12/15/15 0751   12/02/15 0000  erythromycin ethylsuccinate (EES) 200 MG/5ML suspension 400 mg  Status:  Discontinued    Comments:  Administer via J tube (16Fr LLQ)   400 mg Oral Every 6 hours 12/01/15 2020 12/03/15 0748   11/27/15 1200  erythromycin 500 mg in sodium chloride 0.9 % 100 mL IVPB     500 mg 100 mL/hr over 60 Minutes Intravenous Every 6 hours 11/27/15 0940 11/29/15 0608   11/26/15 0830  erythromycin 250 mg in sodium chloride 0.9 % 100 mL IVPB  Status:  Discontinued     250 mg 100 mL/hr over 60 Minutes Intravenous Every 6 hours 11/26/15 0751 11/27/15 0940      Assessment/Plan: s/p Procedure(s):  laparoscopic placement of jejunostomy tube, gastrostomy tube, and EGD balloon dilation of duodenal stricture 10/17.  Developed gastric leakage with peritonitis after pulling out PEG tube 10/19 and taken to OR. On 10/27 found 2 new LUQ abscesses/fluid collections which were drained percutaneously by IR. Had perc trach 10/27.  Progressing. Tolerating tube feeding.  Has hypernatremia and hyperchloremia with slightly elevated creatinine today. Needs free water. Adding additional water to tube feeding and start D5 W IV for now. Repeat labs in a.m. Continue physical therapy, speech therapy    LOS: 31 days    Kyra Laffey T 12/26/2015

## 2015-12-27 LAB — BASIC METABOLIC PANEL
Anion gap: 6 (ref 5–15)
BUN: 79 mg/dL — AB (ref 6–20)
CO2: 24 mmol/L (ref 22–32)
Calcium: 7.3 mg/dL — ABNORMAL LOW (ref 8.9–10.3)
Chloride: 125 mmol/L — ABNORMAL HIGH (ref 101–111)
Creatinine, Ser: 1.33 mg/dL — ABNORMAL HIGH (ref 0.61–1.24)
GFR calc Af Amer: 58 mL/min — ABNORMAL LOW (ref >60)
GFR calc non Af Amer: 50 mL/min — ABNORMAL LOW (ref >60)
GLUCOSE: 133 mg/dL — AB (ref 65–99)
POTASSIUM: 3.7 mmol/L (ref 3.5–5.1)
Sodium: 155 mmol/L — ABNORMAL HIGH (ref 135–145)

## 2015-12-27 NOTE — Progress Notes (Signed)
Nutrition Follow-up  DOCUMENTATION CODES:   Severe malnutrition in context of acute illness/injury  INTERVENTION:   - Continue Vital 1.2 @ 65 mL/hr. Providing 1872 kcal, 117 grams of protein, and 1265 mL free water.  - Recommend transition to bolus G-tube feedings per surgery. -RD to continue to monitor for plan  NUTRITION DIAGNOSIS:   Inadequate oral intake related to inability to eat as evidenced by NPO status.  Ongoing.  GOAL:   Patient will meet greater than or equal to 90% of their needs  Meeting with TF.  MONITOR:   TF tolerance, Weight trends, Labs, Skin, I & O's  ASSESSMENT:   75 year old male presenting for a post-operative visit. Note for "Post-Operative": Patient returns one month status post robotic resection.  Distal gastrectomy with Billroth I gastroduodenal reconstruction for chronic ulcer causing obstruction.  09/25/2015  11/12: Pt in room with RN and wife at bedside. RN states pt has been tolerating TF at goal via J-tube with no issues. Pt's wife states he has been tolerating his medications through the G-tube as well. Recommend transition feeds via G-tube. RN states Dr. Michaell CowingGross has not been covering the weekend, will likely transition feeds once covering care again.  SLP evaluated patient 11/10: recommend repeat MBS sometime this upcoming week.  RD will continue to monitor for plan.  Medications: Nystatin topical powder TID, D5 solution at 50 ml/hr -provides 204 kcal Labs reviewed: Elevated Na  11/10 -TPN d/c'ed yesterday PM. - Per Dr. Michaell CowingGross' note this AM: G tube clamping trial since lower output &less leaking. LIWS if worse. Site care. Abd binder to cover tubes. Hopefully as malnutrition &ileus resolve, will get easier. Try to use G tube for meds since ileus seems to be resolving. If tolerates clamping, may transition to bolus TF via G tube & wean off J tube continuous feeds - Once TF able to be provided via G-tube, likely to switch to a polymeric TF  formula and will have the ability to add protein modulars if needed via that route.   11/9 - Pt now at recommended goal rate for TF: Vital 1.2 @ 65 mL/hr which is providing 1872 kcal, 117 grams of protein, and 1265 mL free water.  - Pt denies abdominal pain/pressure or nausea with TF at goal rate.  - Spoke with Pharmacist via phone who reports hopeful to be able to d/c TPN this afternoon or this evening. - Spoke with pt's daughter and wife, who are at bedside, as pt nods and shakes head but does not use verbal communication at time of RD visit.  - Daughter states that pt has been having fairly frequent BMs and asks if this is normal or concerning.  - Talked with family about provision of TF and that with GI motility improving with improvement of ileus more frequent BMs to be expected and are not concerning from a nutrition-based standpoint. - Daughter reports that pt is gaining strength and his coloration is much improved but he is still very weak.  - She asks about a time frame for regaining strength; deferred this to Surgeon.  - Daughter also asks about ability to repeat swallow evaluation; again deferred to Surgeon and his coordination with SLP.  - Weight +1.9 kg from 11/7 and now consistent with weight on 11/6.   Diet Order:  Diet NPO time specified Except for: Ice Chips  Skin:  Wound (see comment) (Abdominal incision)  Last BM:  11/12  Height:   Ht Readings from Last 1 Encounters:  12/08/15 6\' 2"  (1.88 m)    Weight:   Wt Readings from Last 1 Encounters:  12/26/15 172 lb 13.5 oz (78.4 kg)    Ideal Body Weight:  80.9 kg  BMI:  Body mass index is 22.19 kg/m.  Estimated Nutritional Needs:   Kcal:  1840-2090 (22-25 kcal/kg)  Protein:  100-117 grams (1.2-1.4 grams/kg)  Fluid:  per MD/NP/Surgery  EDUCATION NEEDS:   No education needs identified at this time  Tilda FrancoLindsey Elvie Palomo, MS, RD, LDN Pager: [phone] After Hours Pager: [phone]

## 2015-12-27 NOTE — Progress Notes (Signed)
Dressing change done to J tube and GT site. Minimal drainage noted.

## 2015-12-27 NOTE — Progress Notes (Signed)
Dressing changed done at J tube and GT with small to moderate amount of yellow green, odorous drainage noted.

## 2015-12-27 NOTE — Progress Notes (Signed)
13 Days Post-Op  Subjective: Arousable. No complaints  Objective: Vital signs in last 24 hours: Temp:  [97.6 F (36.4 C)-98 F (36.7 C)] 98 F (36.7 C) (11/12 0712) Pulse Rate:  [81-106] 104 (11/12 0712) Resp:  [16-20] 16 (11/12 0712) BP: (112-138)/(45-58) 126/45 (11/12 0712) SpO2:  [96 %-98 %] 97 % (11/12 0712) FiO2 (%):  [28 %] 28 % (11/11 2324) Last BM Date: 12/27/15  Intake/Output from previous day: 11/11 0701 - 11/12 0700 In: 1452.5 [I.V.:812.5; NG/GT:640] Out: 400 [Urine:400] Intake/Output this shift: No intake/output data recorded.  Resp: rhonchi bilaterally Cardio: regular rate and rhythm GI: soft, nontender. feeding tubes in place  Lab Results:  No results for input(s): WBC, HGB, HCT, PLT in the last 72 hours. BMET  Recent Labs  12/26/15 0500 12/27/15 0348  NA 156* 155*  K 3.9 3.7  CL 127* 125*  CO2 23 24  GLUCOSE 139* 133*  BUN 82* 79*  CREATININE 1.44* 1.33*  CALCIUM 7.6* 7.3*   PT/INR No results for input(s): LABPROT, INR in the last 72 hours. ABG No results for input(s): PHART, HCO3 in the last 72 hours.  Invalid input(s): PCO2, PO2  Studies/Results: No results found.  Anti-infectives: Anti-infectives    Start     Dose/Rate Route Frequency Ordered Stop   12/09/15 1200  anidulafungin (ERAXIS) 100 mg in sodium chloride 0.9 % 100 mL IVPB     100 mg over 90 Minutes Intravenous Every 24 hours 12/09/15 0918 12/10/15 1333   12/05/15 1200  anidulafungin (ERAXIS) 100 mg in sodium chloride 0.9 % 100 mL IVPB  Status:  Discontinued     100 mg over 90 Minutes Intravenous Every 24 hours 12/04/15 1045 12/09/15 0835   12/04/15 1200  anidulafungin (ERAXIS) 200 mg in sodium chloride 0.9 % 200 mL IVPB     200 mg over 180 Minutes Intravenous  Once 12/04/15 1045 12/04/15 1725   12/04/15 0600  cefoTEtan (CEFOTAN) 2 g in dextrose 5 % 50 mL IVPB     2 g 100 mL/hr over 30 Minutes Intravenous On call to O.R. 12/03/15 1821 12/03/15 2007   12/04/15 0400   piperacillin-tazobactam (ZOSYN) IVPB 3.375 g  Status:  Discontinued     3.375 g 12.5 mL/hr over 240 Minutes Intravenous Every 8 hours 12/03/15 2229 12/15/15 0751   12/02/15 0000  erythromycin ethylsuccinate (EES) 200 MG/5ML suspension 400 mg  Status:  Discontinued    Comments:  Administer via J tube (16Fr LLQ)   400 mg Oral Every 6 hours 12/01/15 2020 12/03/15 0748   11/27/15 1200  erythromycin 500 mg in sodium chloride 0.9 % 100 mL IVPB     500 mg 100 mL/hr over 60 Minutes Intravenous Every 6 hours 11/27/15 0940 11/29/15 0608   11/26/15 0830  erythromycin 250 mg in sodium chloride 0.9 % 100 mL IVPB  Status:  Discontinued     250 mg 100 mL/hr over 60 Minutes Intravenous Every 6 hours 11/26/15 0751 11/27/15 0940      Assessment/Plan: s/p Procedure(s) with comments: ESOPHAGOGASTRODUODENOSCOPY (EGD) (N/A) - Patient has tracheostomy tolerating tube feeds  laparoscopic placement of jejunostomy tube, gastrostomy tube, and EGD balloon dilation of duodenal stricture 10/17. Developed gastric leakage with peritonitis after pulling out PEG tube 10/19 and taken to OR. On 10/27 found 2 new LUQ abscesses/fluid collections which were drained percutaneously by IR. Had perc trach 10/27.  Progressing. Tolerating tube feeding. Has hypernatremia and hyperchloremia with slightly elevated creatinine today. Needs free water. Adding additional water to  tube feeding and start D5 W IV for now. Repeat labs in a.m. Continue physical therapy, speech therapy  LOS: 32 days    TOTH III,Nafisa Olds S 12/27/2015

## 2015-12-28 DIAGNOSIS — J9621 Acute and chronic respiratory failure with hypoxia: Secondary | ICD-10-CM

## 2015-12-28 DIAGNOSIS — E870 Hyperosmolality and hypernatremia: Secondary | ICD-10-CM

## 2015-12-28 LAB — COMPREHENSIVE METABOLIC PANEL
ALT: 51 U/L (ref 17–63)
AST: 42 U/L — AB (ref 15–41)
Albumin: 1.6 g/dL — ABNORMAL LOW (ref 3.5–5.0)
Alkaline Phosphatase: 340 U/L — ABNORMAL HIGH (ref 38–126)
Anion gap: 6 (ref 5–15)
BILIRUBIN TOTAL: 0.8 mg/dL (ref 0.3–1.2)
BUN: 69 mg/dL — AB (ref 6–20)
CHLORIDE: 124 mmol/L — AB (ref 101–111)
CO2: 24 mmol/L (ref 22–32)
CREATININE: 1.25 mg/dL — AB (ref 0.61–1.24)
Calcium: 7.3 mg/dL — ABNORMAL LOW (ref 8.9–10.3)
GFR calc Af Amer: >60 mL/min (ref >60)
GFR, EST NON AFRICAN AMERICAN: 54 mL/min — AB (ref >60)
Glucose, Bld: 132 mg/dL — ABNORMAL HIGH (ref 65–99)
Potassium: 3.7 mmol/L (ref 3.5–5.1)
Sodium: 154 mmol/L — ABNORMAL HIGH (ref 135–145)
Total Protein: 5.8 g/dL — ABNORMAL LOW (ref 6.5–8.1)

## 2015-12-28 LAB — BASIC METABOLIC PANEL
BUN: 69 mg/dL — AB (ref 4–21)
Creatinine: 1.3 mg/dL (ref 0.6–1.3)
Glucose: 132 mg/dL
Potassium: 3.7 mmol/L (ref 3.4–5.3)
Sodium: 154 mmol/L — AB (ref 137–147)

## 2015-12-28 LAB — PREALBUMIN: PREALBUMIN: 15.3 mg/dL — AB (ref 18–38)

## 2015-12-28 LAB — PHOSPHORUS: Phosphorus: 2.8 mg/dL (ref 2.5–4.6)

## 2015-12-28 LAB — HEPATIC FUNCTION PANEL: BILIRUBIN, TOTAL: 0.8 mg/dL

## 2015-12-28 LAB — MAGNESIUM: MAGNESIUM: 2.1 mg/dL (ref 1.7–2.4)

## 2015-12-28 MED ORDER — SODIUM CHLORIDE 0.9% FLUSH: 3.0000 mL | Freq: Two times a day (BID) | INTRAVENOUS | Status: DC

## 2015-12-28 MED ORDER — SODIUM CHLORIDE 0.9% FLUSH: 3.0000 mL | INTRAVENOUS | Status: DC | PRN

## 2015-12-28 MED ORDER — FREE WATER
300.0000 mL | Freq: Three times a day (TID) | Status: DC
Start: 2015-12-28 — End: 2015-12-30
  Administered 2015-12-28: 200 mL
  Administered 2015-12-28 – 2015-12-30 (×6): 300 mL

## 2015-12-28 MED ORDER — ENSURE ENLIVE PO LIQD
237.0000 mL | Freq: Four times a day (QID) | ORAL | Status: DC
Start: 2015-12-28 — End: 2015-12-29
  Administered 2015-12-28 (×4): 237 mL

## 2015-12-28 MED ORDER — NICOTINE 7 MG/24HR TD PT24
7.0000 mg | MEDICATED_PATCH | Freq: Every day | TRANSDERMAL | Status: DC
Start: 2015-12-28 — End: 2015-12-30
  Administered 2015-12-28 – 2015-12-30 (×3): 7 mg via TRANSDERMAL
  Filled 2015-12-28 (×4): qty 1

## 2015-12-28 MED ORDER — HALOPERIDOL 1 MG PO TABS
1.0000 mg | ORAL_TABLET | Freq: Every evening | ORAL | Status: DC | PRN
  Administered 2015-12-29 – 2015-12-30 (×3): 1 mg via ORAL
  Filled 2015-12-28 (×4): qty 1

## 2015-12-28 MED ORDER — SODIUM CHLORIDE 0.9 % IV SOLN: 250.0000 mL | INTRAVENOUS | Status: DC | PRN

## 2015-12-28 MED ORDER — PANTOPRAZOLE SODIUM 40 MG PO PACK
40.0000 mg | PACK | Freq: Two times a day (BID) | ORAL | Status: DC
  Administered 2015-12-28 – 2015-12-30 (×5): 40 mg
  Filled 2015-12-28 (×5): qty 20

## 2015-12-28 MED ORDER — LACTATED RINGERS IV BOLUS (SEPSIS): 1000.0000 mL | Freq: Three times a day (TID) | INTRAVENOUS | Status: AC | PRN

## 2015-12-28 MED ORDER — BISACODYL 10 MG RE SUPP: 10.0000 mg | Freq: Two times a day (BID) | RECTAL | Status: DC | PRN

## 2015-12-28 MED ORDER — POLYETHYLENE GLYCOL 3350 17 G PO PACK: 17.0000 g | PACK | Freq: Two times a day (BID) | ORAL | Status: DC | PRN

## 2015-12-28 NOTE — Progress Notes (Signed)
Physical Therapy Treatment Patient Details Name: [name] MRN: [id number] DOB: April 18, 1940 Today's Date: 12/28/2015    History of Present Illness 75 y/o male with peptic ulcer disease admitted on 10/10 with recurrent gastric outlet obstruction secondary to gastritis admitted underwent G and J tube placement this admission.  On 10/19 had to go back to the OR after he removed his G tube accidentally leading to peritonitis, intubation, septic shock. Rt index finger ischemia likely from Rt brachial a line. Traccheostomy 12/11/15. Trial  off of vent /on Trach collar 10/30 until trip to endo then back on vent..      PT Comments    Patient was seen in bed upon arrival. Supine to sit x modA requiring assistance to move BLE's in bed and to raise trunk. Sit to stand x minA from an elevated surface. VC's required for UE placement to push up from the bed when standing and for controlled descent while sitting. +2 side by side assist required to stand and during ambulation. Gait x 8 ft, 15 ft using a bilateral platform walker with VC's to keep head up and to maintain trunk extension to promote improved posture. Patient was on 6L of O2 28% during ambulation. Vitals looked good during today's session. See Orthostatic Vitals. Patient had loose BM while ambulating. [facility] VC's for safe sequencing. Patient was very anxious during today's session and resistant to therapy at first. Patient is limited by decreased activity tolerance, decreased endurance, and weakness due to extended hospital stay.   Follow Up Recommendations  SNF;LTACH;Supervision/Assistance - 24 hour     Equipment Recommendations  None recommended by PT    Recommendations for Other Services       Precautions / Restrictions Precautions Precautions: Fall Precaution Comments:  J tube, G tube, PEG. multiple lines, trach collar 6 lts 28% very weak legs Restrictions Weight Bearing Restrictions: No    Mobility  Bed  Mobility Overal bed mobility: Needs Assistance Bed Mobility: Supine to Sit     Supine to sit: Mod assist     General bed mobility comments: + 1 Mod Assist with use of rail and increased time.   Transfers Overall transfer level: Needs assistance Equipment used: Bilateral platform walker Transfers: Sit to/from Stand Sit to Stand: Min assist;Mod assist;+2 safety/equipment Stand pivot transfers: Max assist;+2 physical assistance;+2 safety/equipment;Mod assist       General transfer comment: + 2 side by side assist to stand with increased ability to self rise rise from elevated bed. VC's required for UE placement to push up from the bed when standing.  VC's required for controlled descent.  VC's for sequencing during standing pivot transfer.   Ambulation/Gait Ambulation/Gait assistance: Mod assist;+2 physical assistance;+2 safety/equipment Ambulation Distance (Feet): 23 Feet (8 feet, 15 feet) Assistive device: Bilateral platform walker Gait Pattern/deviations: Step-to pattern;Decreased step length - right;Decreased step length - left;Trunk flexed;Decreased stride length Gait velocity: decreased Gait velocity interpretation: Below normal speed for age/gender General Gait Details: VC's required to keep head up and trunk extended during ambulation. +2 side by side assist with gait with assistance with managing lines.    Stairs            Wheelchair Mobility    Modified Rankin (Stroke Patients Only)       Balance  Cognition Arousal/Alertness: Awake/alert Behavior During Therapy: WFL for tasks assessed/performed Overall Cognitive Status: Within Functional Limits for tasks assessed                      Exercises      General Comments        Pertinent Vitals/Pain Pain Assessment: No/denies pain Pain Intervention(s): Monitored during session;Repositioned;Limited activity within patient's tolerance    Home  Living                      Prior Function            PT Goals (current goals can now be found in the care plan section) Progress towards PT goals: Progressing toward goals    Frequency    Min 3X/week      PT Plan Current plan remains appropriate    Co-evaluation             End of Session Equipment Utilized During Treatment: Oxygen;Gait belt Activity Tolerance: Treatment limited secondary to medical complications (Comment) Patient left: in chair;with call bell/phone within reach;with chair alarm set;with family/visitor present     Time: 1049-1130 PT Time Calculation (min) (ACUTE ONLY): 41 min  Charges:  $Gait Training: 8-22 mins $Therapeutic Activity: 23-37 mins                    G CodesMarcelino [name]:      [name], SPTA [facility] [phone]  Present and agree with above  Danny ShellingLori Gaspard [name]  PTA [facility]  Acute  Rehab Pager      [phone]

## 2015-12-28 NOTE — Progress Notes (Signed)
Occupational Therapy Treatment Patient Details Name: Danny GalRalph T [name] MRN: [id number] DOB: 02/12/1941 Today's Date: 12/28/2015    History of present illness 75 y/o male with peptic ulcer disease admitted on 10/10 with recurrent gastric outlet obstruction secondary to gastritis admitted underwent G and J tube placement this admission.  On 10/19 had to go back to the OR after he removed his G tube accidentally leading to peritonitis, intubation, septic shock. Rt index finger ischemia likely from Rt brachial a line. Traccheostomy 12/11/15. Trial  off of vent /on Trach collar 10/30 until trip to endo then back on vent..     OT comments  Pt Aed OT with washing hair with shampoo cap- needed max encourgagement  Follow Up Recommendations  SNF    Equipment Recommendations  3 in 1 bedside comode    Recommendations for Other Services      Precautions / Restrictions Precautions Precautions: Fall Precaution Comments:  J tube, G tube, PEG. multiple lines, trach collar 6 lts 28% very weak legs Restrictions Weight Bearing Restrictions: No              ADL Overall ADL's : Needs assistance/impaired     Grooming: Brushing hair;Bed level;Moderate assistance Grooming Details (indicate cue type and reason): OT applied shampoo cap -pt used BUE to remove cap and comb hair.  Pts daugther present. OT encouraged pt to use BUE  for daily skill such as this. Daughter agreed                                                Cognition   Behavior During Therapy: WFL for tasks assessed/performed Overall Cognitive Status: Within Functional Limits for tasks assessed                               General Comments      Pertinent Vitals/ Pain       Pain Assessment: No/denies pain Pain Intervention(s): Monitored during session;Repositioned;Limited activity within patient's tolerance     Prior Functioning/Environment              Frequency  Min 2X/week         Progress Toward Goals  OT Goals(current goals can now be found in the care plan section)  Progress towards OT goals: OT to reassess next treatment     Plan Discharge plan remains appropriate    Co-evaluation                 End of Session     Activity Tolerance Patient limited by fatigue   Patient Left with call bell/phone within reach;with family/visitor present;in bed;with nursing/sitter in room   Nurse Communication          Time: [id number] OT Time Calculation (min): 21 min  Charges: OT General Charges $OT Visit: 1 Procedure OT Treatments $Self Care/Home Management : 8-22 mins  Meklit Cotta, [place] KungLorraine D 12/28/2015, 4:25 PM

## 2015-12-28 NOTE — Progress Notes (Signed)
PCCM PROGRESS NOTE  Admission date: 11/24/2015 Consult date: 12/03/2015 Referring provider: Dr. Michaell CowingGross  CC: Abdominal pain  Brief description: 75 yo male former smoker adm with gastric outlet obstruction with pyloric channel ulcer.  Had Billroth 1, vagotomy, fundoplication, omentopexy August 2017 with initial improvement.  Had recurrent symptoms September 2017 that become progressively worse with gastric dilation from recurrent stricture.  Had laparoscopic placement of jejunostomy tube, gastrostomy tube, and EGD balloon dilation of duodenal stricture 10/17.  Developed gastric leakage with peritonitis after pulling out PEG tube 10/19 and taken to OR. On 10/27 found 2 new LUQ abscesses/fluid collections which were drained percutaneously by IR. Had perc trach 10/27.  Course further complicated by rectal bleeding, emboli to R fingers requiring heparin gtt.    PMHx of CAD s/p stent, GERD, HTN, HLD  Subjective: Tolerates PMV.  Still has chest congestion with clear to yellow secretions >> comes up easily.  Reports that he hasn't smoked in years, but would chew nicotine gum.  Vital signs: BP (!) 112/51 (BP Location: Right Arm)   Pulse (!) 107   Temp 97.6 F (36.4 C) (Oral)   Resp 16   Ht 6\' 2"  (1.88 m)   Wt 166 lb 3.6 oz (75.4 kg)   SpO2 93%   BMI 21.34 kg/m   Intake/output: I/O last 3 completed shifts: In: 3692.5 [I.V.:2212.5; NG/GT:1480] Out: 800 [Urine:800]    Physical exam: General: pleasant Neuro: normal strength HEENT: trach site clean Cardiac: regular, no murmur Chest: no wheeze/rales GI: soft, non tender Ext: no edema Skin: no rashes   CMP Latest Ref Rng & Units 12/28/2015 12/27/2015 12/26/2015  Glucose 65 - 99 mg/dL 829(F132(H) 621(H133(H) 086(V139(H)  BUN 6 - 20 mg/dL 78(I69(H) 69(G79(H) 29(B82(H)  Creatinine 0.61 - 1.24 mg/dL 2.84(X1.25(H) 3.24(M1.33(H) 0.10(U1.44(H)  Sodium 135 - 145 mmol/L 154(H) 155(H) 156(H)  Potassium 3.5 - 5.1 mmol/L 3.7 3.7 3.9  Chloride 101 - 111 mmol/L 124(H) 125(H) 127(H)  CO2 22 -  32 mmol/L 24 24 23   Calcium 8.9 - 10.3 mg/dL 7.3(L) 7.3(L) 7.6(L)  Total Protein 6.5 - 8.1 g/dL 7.2(Z5.8(L) - -  Total Bilirubin 0.3 - 1.2 mg/dL 0.8 - -  Alkaline Phos 38 - 126 U/L 340(H) - -  AST 15 - 41 U/L 42(H) - -  ALT 17 - 63 U/L 51 - -    CBC Latest Ref Rng & Units 12/21/2015 12/18/2015 12/15/2015  WBC 4.0 - 10.5 K/uL 8.9 10.6(H) 10.2  Hemoglobin 13.0 - 17.0 g/dL 3.6(U8.4(L) 4.4(I8.6(L) 3.4(V8.8(L)  Hematocrit 39.0 - 52.0 % 25.9(L) 26.0(L) 26.4(L)  Platelets 150 - 400 K/uL 473(H) 573(H) 633(H)    Imaging: No results found.    Studies: 10/11 CT abdomen/pelvis >> significant distension of the stomach with layering fluid and gas, NG present, worrisome for recurrent gastric outlet obstruction.  RLL consolidation, cholecycstectomy 10/26 CT abdomen/pelvis >> Moderate dilatation of proximal and mid small bowel loops, with transition point in anterior right lower quadrant, suspicious for partial small bowel obstruction. Pneumatosis involving multiple dilated small bowel loops in the left abdomen pelvis, suspicious for bowel ischemia. No evidence of portal venous gas or free intraperitoneal air. Two left upper quadrant fluid collections adjacent to the stomach and spleen, which may represent postoperative fluid collections or abscesses. Small amount free fluid also noted in pelvis.Small bilateral pleural effusions and bibasilar atelectasis. Diffuse mesenteric and body wall edema. 10/30  EGD >> mild esophagitis, evidence of billroth procedure, gastric pouch, non-bleeding duodenal ulcers, non-bleeding jejunal ulcers, could not reach j -  tube 11/02 Modified barium swallow >> silent aspiration   Events: 10/10  Admit  10/17  Lap placement of J & G tubes, EGD w/ balloon dilation of duodenal stricture, bx, closure of gastrotomy 10/19  Lap findings of gastric leak, bilious ascites, jejunal disruption, omentoplexy of jejunal disruption  10/20  R hand discoloration, aline d/c'd, VVS consulted with rec's for  heparin 10/24  Extubated. Required re-intubation.    10/26  Heparin d/c'd by vascular. On pressors, albumin, lasix.  Developed rectal bleeding after starting bowel prep. 10/27  Trach placed. To IR for Perc drains to be placed in LUQ 10/31 PMSV trial, pt tolerated well 11/01  24 hours on trach collar; PMV in place  Lines: 10/19 ETT >> 10/24 then replaced 10/24 >> 10/27 10/19 G tube >> 10/19 J tube >> 10/26 Lt PICC >>  10/27 Trach placed (feinstein # 6 cuffed) >>    Assessment/plan:   Acute and chronic respiratory failure 2nd to peritonitis. Failure to wean s/p tracheostomy. - tracheostomy collar O2 supplementation to maintain O2 > 90% - PMSV in place when supervised - Change to #6 cuffless trach 11/10.  Favor keeping #6 for now given potential for vent need, no downsize to #4 - would consider decannulation only when he is ambulatory  SUP - protonix DVT prophylaxis - SCDs Goals of care - Full code  PCCM will f/u on 12/31/15 >> call if help needed sooner.  Coralyn HellingVineet Rylee Huestis, MD [facility] Pulmonary/Critical Care 12/28/2015, 9:35 AM Pager:  [phone] After 3pm call: [phone]

## 2015-12-28 NOTE — Progress Notes (Addendum)
[facility]  [address] [place], [place] [address] Phone: [phone] FAX: [phone]   [name] [id number] Aug 02, 1940  CARE TEAM:  PCP: Irven Shelling, MD  Outpatient Care Team: Patient Care Team: Lavone Orn, MD as PCP - General (Internal Medicine) Michael Boston, MD as Consulting Physician (General Surgery) Arta Silence, MD as Consulting Physician (Gastroenterology)  Inpatient Treatment Team: Treatment Team: Attending Provider: Michael Boston, MD; Consulting Physician: Arta Silence, MD; Technician: Sueanne Margarita, NT; Consulting Physician: Md Pccm, MD; Rounding Team: Md Pccm, MD; Chaplain: Hadley Pen, Chaplain; Registered Nurse: Sibyl Parr, RN; Technician: Tenna Child, [place]; Registered Nurse: Bailey Mech, RN; Physical Therapy Assistant: Diego Cory, PTA  SURGERY 12/03/2015  POST-OPERATIVE DIAGNOSIS:    Peritonitis Jejunal disruption Gastric leak  PROCEDURE:    LAPAROSCOPY DIAGNOSTIC OMENTOPEXY of jejunal disruption [place] OUT x 21L with drains placement  SURGEON:  Michael Boston, MD   Post-Op 12/01/2015  POST-OPERATIVE DIAGNOSIS:    RECURRENT GASTRIC OUTLET OBSTRUCTION DUE to significant edema at gastroduodenal anastomosis. GASTRITIS Failure to thrive with malnutrition  PROCEDURE:   LAPAROSCOPIC PLACEMENT OF FEEDING JEJUNOSTOMY AND GASTROSTOMY TUBEs ESOPHAGOGASTRODUODENOSCOPY (EGD) BALLOON DILATION OF DUODENAL STRICTURE Gastric biopsies  Closure of gastrotomy  SURGEON:  Michael Boston, MD  Prior surgery 09/25/2015  POST-OPERATIVE DIAGNOSIS:  Partial gastric outlet obstruction due to chronic pyloric stricture  PROCEDURE:   XI ROBOTIC distal gastrectomy BILROTH I  ANASTOMOSIS  ANTERIOR AND POSTERIOR VAGOTOMY DOR (Anterior 268 degree) FUNDIPLICATION OMENTOPEXY UPPER ENDOSCOPY  SURGEON:  Michael Boston, MD   Problem List:   Principal Problem:   Bile  peritonitis due to gastric tube dislodgement Active Problems:   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 09/25/2015   Pneumatosis of intestines   Essential hypertension, benign   Hyperlipidemia   Anxiety state   Gastric outlet obstruction   Gastric Ileus    Encephalopathy acute   Protein-calorie malnutrition, severe   Acute respiratory failure with hypoxemia (HCC)   Tobacco abuse   Ischemic right index finger at tip   Gastritis   Gastric tube present (LUQ)   Jejunostomy tube present (LLQ)   Tracheostomy in place [facility])    Assessment/Plan:  RECOVERING   OK so far on floor.  RNs hopeful he is more calm & needs less intense care.  Return to SDU if fails  Try G tube bolus feeds.  LIWS if worse.  Site care.  Abd binder to cover tubes.  Hopefully as malnutrition & ileus resolve, will get easier.    Free water via G tube as tolerated  PT/OT/SLP.  Speech therapy reevaluate to rule out aspiration and see if he can try doing some drinking up to a pureed diet with the G-tube in place.  Deconditioned = guarded with PO attempts.  MBS eval with trach downsized.  Sedation/Anxiolysis with confusion.   More calm a hopeful sign with Haldol, but guarded w pulling out tubes x3 this admission.  Switch to enteral Haldol QHS PRN with IV backup.  Follow off ABx.    Cultures negative = No more peritoneal drains.  Hopefully outside window of developing new intraperitoneal abscess.  CT scan if cannot tolerate or worsens to r/o worsening pneumatosis/hematoma/abscess/SBO.    Follow & replace electrolytes / free water as needed (esp K) as needed  PPI for gastritis.  Able to pass endoscopy tube across the gastroduodenal Bilroth anastomosis & decrease G tube residuals signs that gastric outlet obstruction from  inflammation is improving.  Inflammatory narrowing at gastroduodenal Bilroth I anastomosis.  The fact that there is bilious reflux into the stomach & tolerates G tube clamping  argues against a complete obstruction.  Skin care.     VTE prophylaxis - SCDs.  Hold off on enoxaparin if Hgb <8 or persistent bleeding.     Brachial artery embolus to right index finger resolved.   Finger tip embolic petechiae stable with sensation  Nicotene patch   Disposition: He will definitely require skilled care facility with rehabilitation capacity upon discharge from the hospital.  Hopefully transition soon if airway stable.  I suspect it's going to take until later this week before he'll be ready for that transition.  FL2 cosigned  I discussed operative findings, updated the patient's status, discussed probable steps to recovery, and gave postoperative recommendations to the patient. RNs.  Recommendations were made.  Questions were answered.  He expressed understanding & appreciation.    Ardeth Sportsman, M.D., F.A.C.S. Gastrointestinal and Minimally Invasive Surgery [facility] [address] [place], [place] [address] [phone] Main / Paging    12/28/2015   Subjective:  At floor bed near RN station. Denies pain TF at goal over weekend Trach downsized [place] - getting free water  Objective:  Vital signs:  Vitals:   12/27/15 1240 12/27/15 1400 12/27/15 2149 12/28/15 0635  BP:  (!) 136/42 (!) 112/51   Pulse:  (!) 113 (!) 107   Resp:   16   Temp:   97.6 F (36.4 C)   TempSrc:   Oral   SpO2: 97% 95% 93%   Weight:    75.4 kg (166 lb 3.6 oz)  Height:        Last BM Date: 12/27/15  Intake/Output   Yesterday:  11/12 0701 - 11/13 0700 In: 2240 [I.V.:1400; NG/GT:840] Out: 800 [Urine:800] This shift:  No intake/output data recorded.  Bowel function:  Flatus: YES  BM:  YES  Gtube: thin light tan - no blood.  Leaking around skin less Jtube site: cleaner  Physical Exam:  General: Pt awakens in NAD. Less confused.  Tired.  No acute distress.  Interacting with staff Eyes: PERRL, normal EOM.  Sclera clear.  No  icterus Neuro: CN II-XII intact w/o focal sensory/motor deficits. Lymph: No head/neck/groin lymphadenopathy Psych:  No delerium/psychosis/paranoia.   HENT: Normocephalic, Mucus membranes moist.  No thrush. ETT in place.  Less secretions Neck: Supple, No tracheal deviation Chest: Chest wall pain w good excursion.  No wheezing. CV:  Pulses intact.  Regular rhythm MS: Normal AROM mjr joints.  No obvious deformity Abdomen:  G tube LUQ.  Rash around skin smaller with less drainage  J tube in LLQ - skin irritated but stable  Edema left flank.   Softer.  Nondistended.  Nontender.  No guarding GU:  NEMG Ext:  SCDs BLE.  No mjr edema.  Right index finger pad with purple demarcation but + sensation.  Nail bed intact   PICC LUE clean Skin: No more anasarca.  No other petechiae / purpura  Results:   Diagnosis Stomach, biopsy - CHRONIC FOCALLY ACTIVE GASTRITIS. - THERE IS NO EVIDENCE OF HELICOBACTER PYLORI, DYSPLASIA OR MALIGNANCY. - SEE COMMENT. Microscopic Comment A Warthin-Starry stain is negative for the presence of Helicobacter pylori organisms. (JBK:gt, 12/04/15) Pecola Leisure MD Pathologist, Electronic Signature (Case signed 12/04/2015) Specimen [name] and Clinical Information Specimen(s) Obtained: Stomach, biopsy Specimen Clinical  Labs: Results for orders placed or performed during  the hospital encounter of 11/24/15 (from the past 48 hour(s))  Basic metabolic panel     Status: Abnormal   Collection Time: 12/27/15  3:48 AM  Result Value Ref Range   Sodium 155 (H) 135 - 145 mmol/L   Potassium 3.7 3.5 - 5.1 mmol/L   Chloride 125 (H) 101 - 111 mmol/L   CO2 24 22 - 32 mmol/L   Glucose, Bld 133 (H) 65 - 99 mg/dL   BUN 79 (H) 6 - 20 mg/dL   Creatinine, Ser 1.33 (H) 0.61 - 1.24 mg/dL   Calcium 7.3 (L) 8.9 - 10.3 mg/dL   GFR calc non Af Amer 50 (L) >60 mL/min   GFR calc Af Amer 58 (L) >60 mL/min    Comment: (NOTE) The eGFR has been calculated using the CKD EPI equation. This  calculation has not been validated in all clinical situations. eGFR's persistently <60 mL/min signify possible Chronic Kidney Disease.    Anion gap 6 5 - 15  Comprehensive metabolic panel     Status: Abnormal   Collection Time: 12/28/15  4:19 AM  Result Value Ref Range   Sodium 154 (H) 135 - 145 mmol/L   Potassium 3.7 3.5 - 5.1 mmol/L   Chloride 124 (H) 101 - 111 mmol/L   CO2 24 22 - 32 mmol/L   Glucose, Bld 132 (H) 65 - 99 mg/dL   BUN 69 (H) 6 - 20 mg/dL   Creatinine, Ser 1.25 (H) 0.61 - 1.24 mg/dL   Calcium 7.3 (L) 8.9 - 10.3 mg/dL   Total Protein 5.8 (L) 6.5 - 8.1 g/dL   Albumin 1.6 (L) 3.5 - 5.0 g/dL   AST 42 (H) 15 - 41 U/L   ALT 51 17 - 63 U/L   Alkaline Phosphatase 340 (H) 38 - 126 U/L   Total Bilirubin 0.8 0.3 - 1.2 mg/dL   GFR calc non Af Amer 54 (L) >60 mL/min   GFR calc Af Amer >60 >60 mL/min    Comment: (NOTE) The eGFR has been calculated using the CKD EPI equation. This calculation has not been validated in all clinical situations. eGFR's persistently <60 mL/min signify possible Chronic Kidney Disease.    Anion gap 6 5 - 15  Magnesium     Status: None   Collection Time: 12/28/15  4:19 AM  Result Value Ref Range   Magnesium 2.1 1.7 - 2.4 mg/dL  Phosphorus     Status: None   Collection Time: 12/28/15  4:19 AM  Result Value Ref Range   Phosphorus 2.8 2.5 - 4.6 mg/dL    Imaging / Studies: No results found.  Medications / Allergies: per chart  Antibiotics: Anti-infectives    Start     Dose/Rate Route Frequency Ordered Stop   12/09/15 1200  anidulafungin (ERAXIS) 100 mg in sodium chloride 0.9 % 100 mL IVPB     100 mg over 90 Minutes Intravenous Every 24 hours 12/09/15 0918 12/10/15 1333   12/05/15 1200  anidulafungin (ERAXIS) 100 mg in sodium chloride 0.9 % 100 mL IVPB  Status:  Discontinued     100 mg over 90 Minutes Intravenous Every 24 hours 12/04/15 1045 12/09/15 0835   12/04/15 1200  anidulafungin (ERAXIS) 200 mg in sodium chloride 0.9 % 200 mL IVPB      200 mg over 180 Minutes Intravenous  Once 12/04/15 1045 12/04/15 1725   12/04/15 0600  cefoTEtan (CEFOTAN) 2 g in dextrose 5 % 50 mL IVPB     2 g  100 mL/hr over 30 Minutes Intravenous On call to O.R. 12/03/15 1821 12/03/15 2007   12/04/15 0400  piperacillin-tazobactam (ZOSYN) IVPB 3.375 g  Status:  Discontinued     3.375 g 12.5 mL/hr over 240 Minutes Intravenous Every 8 hours 12/03/15 2229 12/15/15 0751   12/02/15 0000  erythromycin ethylsuccinate (EES) 200 MG/5ML suspension 400 mg  Status:  Discontinued    Comments:  Administer via J tube (16Fr LLQ)   400 mg Oral Every 6 hours 12/01/15 2020 12/03/15 0748   11/27/15 1200  erythromycin 500 mg in sodium chloride 0.9 % 100 mL IVPB     500 mg 100 mL/hr over 60 Minutes Intravenous Every 6 hours 11/27/15 0940 11/29/15 0608   11/26/15 0830  erythromycin 250 mg in sodium chloride 0.9 % 100 mL IVPB  Status:  Discontinued     250 mg 100 mL/hr over 60 Minutes Intravenous Every 6 hours 11/26/15 0751 11/27/15 0940        Note: Portions of this report may have been transcribed using voice recognition software. Every effort was made to ensure accuracy; however, inadvertent computerized transcription errors may be present.   Any transcriptional errors that result from this process are unintentional.    [name], M.D., F.A.C.S. Gastrointestinal and Minimally Invasive Surgery [facility] [address], [place], [place] [address] [phone] Main / Paging     12/28/2015

## 2015-12-29 ENCOUNTER — Inpatient Hospital Stay (HOSPITAL_COMMUNITY): Payer: Medicare Other

## 2015-12-29 LAB — BASIC METABOLIC PANEL
ANION GAP: 7 (ref 5–15)
BUN: 63 mg/dL — ABNORMAL HIGH (ref 6–20)
BUN: 63 mg/dL — AB (ref 4–21)
CALCIUM: 7.5 mg/dL — AB (ref 8.9–10.3)
CO2: 23 mmol/L (ref 22–32)
CREATININE: 1.4 mg/dL — AB (ref 0.6–1.3)
Chloride: 123 mmol/L — ABNORMAL HIGH (ref 101–111)
Creatinine, Ser: 1.44 mg/dL — ABNORMAL HIGH (ref 0.61–1.24)
GFR, EST AFRICAN AMERICAN: 53 mL/min — AB (ref >60)
GFR, EST NON AFRICAN AMERICAN: 46 mL/min — AB (ref >60)
GLUCOSE: 117 mg/dL
Glucose, Bld: 117 mg/dL — ABNORMAL HIGH (ref 65–99)
Potassium: 3.8 mmol/L (ref 3.5–5.1)
Sodium: 153 mmol/L — AB (ref 137–147)
Sodium: 153 mmol/L — ABNORMAL HIGH (ref 135–145)

## 2015-12-29 LAB — CBC
HCT: 24.7 % — ABNORMAL LOW (ref 39.0–52.0)
HEMOGLOBIN: 7.8 g/dL — AB (ref 13.0–17.0)
MCH: 28.4 pg (ref 26.0–34.0)
MCHC: 31.6 g/dL (ref 30.0–36.0)
MCV: 89.8 fL (ref 78.0–100.0)
Platelets: 509 10*3/uL — ABNORMAL HIGH (ref 150–400)
RBC: 2.75 MIL/uL — AB (ref 4.22–5.81)
RDW: 16.3 % — ABNORMAL HIGH (ref 11.5–15.5)
WBC: 15.3 10*3/uL — ABNORMAL HIGH (ref 4.0–10.5)

## 2015-12-29 LAB — CBC AND DIFFERENTIAL: WBC: 15.3 10^3/mL

## 2015-12-29 MED ORDER — LOPERAMIDE HCL 2 MG PO CAPS: 2.0000 mg | ORAL_CAPSULE | Freq: Three times a day (TID) | ORAL | Status: DC | PRN

## 2015-12-29 MED ORDER — CHLORHEXIDINE GLUCONATE 0.12 % MT SOLN
OROMUCOSAL | Status: AC
  Administered 2015-12-29: 22:00:00
  Filled 2015-12-29: qty 15

## 2015-12-29 MED ORDER — VITAL AF 1.2 CAL PO LIQD
1000.0000 mL | ORAL | Status: DC
  Administered 2015-12-29: 1000 mL
  Filled 2015-12-29: qty 1000

## 2015-12-29 MED ORDER — FENTANYL CITRATE (PF) 100 MCG/2ML IJ SOLN
12.5000 ug | INTRAMUSCULAR | Status: DC | PRN
  Administered 2015-12-29: 25 ug via INTRAVENOUS
  Administered 2015-12-29: 12.5 ug via INTRAVENOUS
  Administered 2015-12-30 (×2): 25 ug via INTRAVENOUS
  Filled 2015-12-29 (×4): qty 2

## 2015-12-29 MED ORDER — ACETAMINOPHEN 500 MG PO TABS
1000.0000 mg | ORAL_TABLET | Freq: Three times a day (TID) | ORAL | Status: DC
  Administered 2015-12-29 – 2015-12-30 (×4): 1000 mg
  Filled 2015-12-29 (×4): qty 2

## 2015-12-29 MED ORDER — METOPROLOL TARTRATE 12.5 MG HALF TABLET
12.5000 mg | ORAL_TABLET | Freq: Every day | ORAL | Status: DC
  Administered 2015-12-29: 12.5 mg
  Filled 2015-12-29 (×2): qty 1

## 2015-12-29 MED ORDER — JEVITY 1.2 CAL PO LIQD
237.0000 mL | Freq: Three times a day (TID) | ORAL | Status: DC
  Administered 2015-12-29 (×3): 237 mL
  Filled 2015-12-29 (×5): qty 237

## 2015-12-29 MED ORDER — BISMUTH SUBSALICYLATE 262 MG/15ML PO SUSP
30.0000 mL | Freq: Three times a day (TID) | ORAL | Status: DC | PRN
  Filled 2015-12-29: qty 118

## 2015-12-29 MED ORDER — PRO-STAT SUGAR FREE PO LIQD
30.0000 mL | Freq: Every day | ORAL | Status: DC
  Administered 2015-12-29: 30 mL
  Filled 2015-12-29: qty 30

## 2015-12-29 NOTE — Progress Notes (Signed)
Physical Therapy Treatment Patient Details Name: [name] MRN: [id number] DOB: Dec 29, 1940 Today's Date: 12/29/2015    History of Present Illness 75 y/o male with peptic ulcer disease admitted on 10/10 with recurrent gastric outlet obstruction secondary to gastritis admitted underwent G and J tube placement this admission.  On 10/19 had to go back to the OR after he removed his G tube accidentally leading to peritonitis, intubation, septic shock. Rt index finger ischemia likely from Rt brachial a line. Traccheostomy 12/11/15. Trial  off of vent /on Trach collar 10/30 until trip to endo then back on vent..      PT Comments    Patient was seen in bed upon arrival with family present. Performed supine to sit x modA with VC's to move LE over in the bed and modA+1 to raise trunk from elevated HOB. C/o minor dizziness during initial sit. Vitals remained stable. See Vitals.Patient displayed sway and tight grip on the bed.  Sit to stand x minA with >50% VC's for UE placement to push up from the bed when standing and for controlled decent. Gait x 574ft, 628ft, 15 ft with 2 sitting rest breaks. VC's required for upright posture and to slow down breathing during ambulation.  No complaints of pain. Patient was very fatigued during today's session due to barium swallow being earlier that morning. Patient is limited due to decreased endurance, strength, and decreased activity tolerance secondary to increased hospital stay.   Follow Up Recommendations  SNF;LTACH;Supervision/Assistance - 24 hour     Equipment Recommendations  None recommended by PT    Recommendations for Other Services       Precautions / Restrictions Precautions Precautions: Fall Precaution Comments:  J tube, G tube, PEG. multiple lines, trach collar 6 lts 28% very weak legs Restrictions Weight Bearing Restrictions: No    Mobility  Bed Mobility Overal bed mobility: Needs Assistance Bed Mobility: Supine to Sit     Supine to  sit: Mod assist     General bed mobility comments: VC's required to move LE's over in bed. ModA required to raise trunk in bed. Increased time required.   Transfers Overall transfer level: Needs assistance Equipment used: Bilateral platform walker Transfers: Sit to/from Stand Sit to Stand: Min assist;Mod assist;+2 safety/equipment         General transfer comment: + 2 side by side assist to stand from elevated bed. Excessive VC's required for UE placement to push up from the bed when standing.  VC's required for controlled descent.   Ambulation/Gait Ambulation/Gait assistance: Mod assist;+2 physical assistance;+2 safety/equipment Ambulation Distance (Feet): 27 Feet (4 ft, 8 ft, 15 ft. ) Assistive device: Bilateral platform walker Gait Pattern/deviations: Step-to pattern;Decreased step length - left;Decreased step length - right;Trunk flexed;Decreased stride length Gait velocity: decreased Gait velocity interpretation: Below normal speed for age/gender General Gait Details: VC's required to keep head up and trunk extended during ambulation. +2 side by side assist with gait with assistance with managing lines. Required two sitting rest breaks.  Patient was very fatigued today d/t Barium swallow earlier that day.    Stairs            Wheelchair Mobility    Modified Rankin (Stroke Patients Only)       Balance                                    Cognition Arousal/Alertness: Awake/alert Behavior During Therapy: [facility]'SWFL  for tasks assessed/performed Overall Cognitive Status: Within Functional Limits for tasks assessed                      Exercises      General Comments        Pertinent Vitals/Pain Pain Assessment: No/denies pain Pain Score: 0-No pain Faces Pain Scale: No hurt    Home Living                      Prior Function            PT Goals (current goals can now be found in the care plan section) Progress towards PT goals:  Progressing toward goals    Frequency    Min 3X/week      PT Plan Current plan remains appropriate    Co-evaluation             End of Session Equipment Utilized During Treatment: Oxygen;Gait belt Activity Tolerance: Treatment limited secondary to medical complications (Comment) Patient left: in chair;with call bell/phone within reach;with chair alarm set;with family/visitor present     Time:  - 10:45 - 11:16    Charges:    1  Gt   1 ta                   G CodesMarcelino [name]:       Caitlin Medlin, SPTA [facility] Acute Rehab [phone]  Present and agree with above  Felecia ShellingLori Peretz Thieme  PTA [facility]  Acute  Rehab Pager      [phone]

## 2015-12-29 NOTE — Clinical Social Work Placement (Signed)
   CLINICAL SOCIAL WORK PLACEMENT  NOTE  Date:  12/29/2015  Patient Details  Name: [name] MRN: [id number] Date of Birth: 11-11-1940  Clinical Social Work is seeking post-discharge placement for this patient at the Skilled  Nursing Facility level of care (*CSW will initial, date and re-position this form in  chart as items are completed):  Yes   Patient/family provided with [facility] Clinical Social Work Department's list of facilities offering this level of care within the geographic area requested by the patient (or if unable, by the patient's family).  Yes   Patient/family informed of their freedom to choose among providers that offer the needed level of care, that participate in Medicare, Medicaid or managed care program needed by the patient, have an available bed and are willing to accept the patient.  Yes   Patient/family informed of [facility]'s ownership interest in [facility] and [facility], as well as of the fact that they are under no obligation to receive care at these facilities.  PASRR submitted to EDS on 12/23/15     PASRR number received on 12/23/15     Existing PASRR number confirmed on       FL2 transmitted to all facilities in geographic area requested by pt/family on 12/23/15     FL2 transmitted to all facilities within larger geographic area on       Patient informed that his/her managed care company has contracts with or will negotiate with certain facilities, including the following:        Yes   Patient/family informed of bed offers received.  Patient chooses bed at  [facility] )     Physician recommends and patient chooses bed at      Patient to be transferred to  [facility] ) on  .  Patient to be transferred to facility by  Sharin Mons([facility] )     Patient family notified on   of transfer.  Name of family member notified:   (Pt's wife, [name] and dtr, [name] )     PHYSICIAN Please sign FL2, Please  prepare priority discharge summary, including medications     Additional Comment:    _______________________________________________ Derenda FennelNixon, Alonso Gapinski A 12/29/2015, 11:42 AM

## 2015-12-29 NOTE — Progress Notes (Addendum)
Modified Barium Swallow Progress Note  Patient Details  Name: Danny GalRalph T Grossi MRN: [id number] Date of Birth: May 26, 1940  Today's Date: 12/29/2015  Modified Barium Swallow completed.  Full report located under Chart Review in the Imaging Section.  Brief recommendations include the following:  Clinical Impression  Patient continues with moderate oropharyngeal dysphagia that appears marginally worse than prior MBS 12/17/15 results.  Swallow function continues to be significantly weak resulting in residuals and aspiration with poor cough mechanism.   Pt again fatigued during MBS and at times had difficulty keeping his eyes open.     Viscous green tinged secretions coughed through trach (blew off PMSV) after oral care and before barium administration.  Pt also with copious secretions coughed AROUND trach following MBS *pt had PMSV in place.     Gross weakness secretion retention that mixed with barium without consistent awareness nor ability to clear.  Pt again only given minimal amounts of barium - thin, nectar, pudding.  Delayed oral transiting, lingual pumping and premature spillage of barium into pharynx noted due to weakness.  Compromised tongue base retraction and laryngeal elevation allowed laryngeal penetration and SILENT aspiration both during and after the swallow.  Cues to cough/throat clear and dry swallow did not fully clear penetrates and patient quickly re-penetrated pharyngeal residuals.  In addition patient appeared with decreased proximal esophageal opening/decreased CP opening resulting in residuals in pyriform sinus.    Unfortunately patient is at high risk of aspiration with intake at this time.  Using teach back/live video educated patient and daughter to findings and reinforced effective compensation strategies.     Recommend continue NPO except single ice chips with strict precautions - oral care, pmsv in place and cueing patient to cough/clear throat and re-swallow.  Pt also  with increase in delayed responses to SLP instructions/cues today- and states "I'm weak".  Given pt's lack of swallow improvement, SlP is concerned for swallow prognosis during acute stay.    SLP phoned RN after testing and made her aware of results, recommendations and green tinged secretions/cognitive deficit.     Pt will need repeat MBS in future due to silent nature of dysphagia. Will follow up for dysphagia treatment/management.   Thanks for allowing me to help care for this patient.    Swallow Evaluation Recommendations       SLP Diet Recommendations: NPO;Ice chips PRN after oral care       Medication Administration: Via alternative means       Compensations: Minimize environmental distractions;Slow rate;Multiple dry swallows after each bite/sip;Hard cough after swallow       Oral Care Recommendations: Oral care QID        Mickie BailKimball, Aimee Heldman Ann Kash Davie BockKimball, MS [facility] SLP [phone]

## 2015-12-29 NOTE — Progress Notes (Signed)
Order for MBS received, pt seen at bedside with daughter.  Currently pt sound asleep, daughter reports he received fentanyl.  Daughter reports pt with much improved tolerance of ice chips.  Will plan MBS today - invited daughter to observe study. Donavan Burnetamara Deveney Bayon, MS [facility] SLP [phone]

## 2015-12-29 NOTE — Progress Notes (Addendum)
[name] [place], [place] [address] Phone: [phone] FAX: [phone]   ALEXZAVIER GIRARDIN [id number] May 13, 1940  CARE TEAM:  PCP: Irven Shelling, MD  Outpatient Care Team: Patient Care Team: Lavone Orn, MD as PCP - General (Internal Medicine) Michael Boston, MD as Consulting Physician (General Surgery) Arta Silence, MD as Consulting Physician (Gastroenterology)  Inpatient Treatment Team: Treatment Team: Attending Provider: Michael Boston, MD; Consulting Physician: Arta Silence, MD; Technician: Sueanne Margarita, NT; Consulting Physician: Md Pccm, MD; Rounding Team: Md Pccm, MD; Chaplain: Hadley Pen, Chaplain; Registered Nurse: Sibyl Parr, RN; Technician: Tenna Child, [place]; Registered Nurse: Bailey Mech, RN; Physical Therapy Assistant: Diego Cory, PTA; Speech Language Pathologist: Narda Rutherford, [place]; Physical Therapist: Lizabeth Leyden  SURGERY 12/03/2015  POST-OPERATIVE DIAGNOSIS:    Peritonitis Jejunal disruption Gastric leak  PROCEDURE:    LAPAROSCOPY DIAGNOSTIC OMENTOPEXY of jejunal disruption [place] OUT x 21L with drains placement  SURGEON:  Michael Boston, MD   Post-Op 12/01/2015  POST-OPERATIVE DIAGNOSIS:    RECURRENT GASTRIC OUTLET OBSTRUCTION DUE to significant edema at gastroduodenal anastomosis. GASTRITIS Failure to thrive with malnutrition  PROCEDURE:   LAPAROSCOPIC PLACEMENT OF FEEDING JEJUNOSTOMY AND GASTROSTOMY TUBEs ESOPHAGOGASTRODUODENOSCOPY (EGD) BALLOON DILATION OF DUODENAL STRICTURE Gastric biopsies  Closure of gastrotomy  SURGEON:  Michael Boston, MD  Prior surgery 09/25/2015  POST-OPERATIVE DIAGNOSIS:  Partial gastric outlet obstruction due to chronic pyloric stricture  PROCEDURE:   XI ROBOTIC distal gastrectomy BILROTH I  ANASTOMOSIS  ANTERIOR AND POSTERIOR VAGOTOMY DOR (Anterior 401 degree) FUNDIPLICATION OMENTOPEXY UPPER  ENDOSCOPY  SURGEON:  Michael Boston, MD   Problem List:   Principal Problem:   Bile peritonitis due to gastric tube dislodgement Active Problems:   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 09/25/2015   Pneumatosis of intestines   Essential hypertension, benign   Hyperlipidemia   Anxiety state   Gastric outlet obstruction   Gastric Ileus    Encephalopathy acute   Protein-calorie malnutrition, severe   Acute respiratory failure with hypoxemia (HCC)   Tobacco abuse   Ischemic right index finger at tip   Gastritis   Gastric tube present (LUQ)   Jejunostomy tube present (LLQ)   Tracheostomy in place (HCC)   Hypernatremia    Assessment/Plan:  RECOVERING   G tube bolus feeds.  LIWS if worse.  Site care.  Abd binder to cover tubes.  Hopefully as malnutrition & ileus resolve, will get easier.    Free water via G tube as tolerated.  Follow/replace lytes  PT/OT - walking a good sign  Speech therapy reevaluate to rule out aspiration and see if he can try doing some drinking up to a pureed diet with the G-tube in place.  Deconditioned = guarded with PO attempts.  MBS reeval with trach downsized.  Consider downsize to cuffless 4 with less secretions & getting up more.  Will make swallowing easier I think.  Dr Halford Chessman we'll reconsider now the patient is up & moving around more.  Improve enteral pain control & try to get off IV meds.  Tramadol better tolerated over others but may need something stronger  Sedation/Anxiolysis with confusion.   More calm a hopeful sign with Haldol, but guarded w pulling out tubes x3 this admission.  Switch to enteral Haldol QHS PRN with IV backup.  Follow off ABx.    Cultures negative = No more peritoneal drains.  Hopefully outside window of developing new intraperitoneal abscess.  CT scan if cannot tolerate or worsens to r/o worsening pneumatosis/hematoma/abscess/SBO.    OK so far on floor.  RNs hopeful he is more calm & needs less  intense care.  Return to SDU if fails  PPI for gastritis.  Able to pass endoscopy tube across the gastroduodenal Bilroth anastomosis & decrease G tube residuals signs that gastric outlet obstruction from inflammation is improving.  Inflammatory narrowing at gastroduodenal Bilroth I anastomosis.  The fact that there is bilious reflux into the stomach & tolerates G tube clamping argues against a complete obstruction.  Skin care.     VTE prophylaxis - SCDs.  Hold off on enoxaparin if Hgb <8 or persistent bleeding.     Brachial artery embolus to right index finger resolved.   Finger tip embolic petechiae stable with sensation  Wean off B blocker as tolerated  Elevate LUE - d/c PICC at d/c to rehab  Nicotene patch   Disposition: He will definitely require skilled care facility with rehabilitation capacity upon discharge from the hospital.  Hopefully transition soon if airway stable.  I suspect it's going to take until later this week before he'll be ready for that transition.  FL2 cosigned  I discussed operative findings, updated the patient's status, discussed probable steps to recovery, and gave postoperative recommendations to the patient& his daughter.  Also w RNs.  Recommendations were made.  Questions were answered.  They expressed understanding & appreciation.    [name], M.D., F.A.C.S. Gastrointestinal and Minimally Invasive Surgery [facility] [address], [place], [place] [address] [phone] Main / Paging    12/29/2015   Subjective:  Pt feeling "fair to middling" Daughter at bedside RNs & CNA in room intermittently At floor bed near RN station. Needing IV pain meds still Walked in hallway TF boluses started Free water started.  High Na - getting free water Trach downsized.  Less secretions.  Tol PMV better   Objective:  Vital signs:  Vitals:   12/28/15 2158 12/28/15 2330 12/29/15 0340 12/29/15 0643  BP: (!) 127/52    (!) 122/54  Pulse: (!) 109 75 90 88  Resp: 20 18 18 16   Temp: 98 F (36.7 C)   97.8 F (36.6 C)  TempSrc: Oral   Oral  SpO2: 96% 97% 96% 96%  Weight:    76.2 kg (167 lb 15.9 oz)  Height:        Last BM Date: 12/28/15  Intake/Output   Yesterday:  11/13 0701 - 11/14 0700 In: 0  Out: 700 [Urine:700] This shift:  No intake/output data recorded.  Bowel function:  Flatus: YES  BM:  YES  Gtube: thin light tan - no blood.  Leaking around skin less Jtube site: cleaner  Physical Exam:  General: Pt awakens in NAD. Less confused.  Tired.  No acute distress.  Interacting with staff Eyes: PERRL, normal EOM.  Sclera clear.  No icterus Neuro: CN II-XII intact w/o focal sensory/motor deficits. Lymph: No head/neck/groin lymphadenopathy Psych:  No delerium/psychosis/paranoia.   HENT: Normocephalic, Mucus membranes moist.  No thrush. ETT in place.  Less secretions Neck: Supple, No tracheal deviation Chest: Chest wall pain w good excursion.  No wheezing. CV:  Pulses intact.  Regular rhythm MS: Normal AROM mjr joints.  No obvious deformity.  LBP persists Abdomen:  G tube LUQ.  Rash around skin smaller with less drainage  J tube in LLQ - skin irritated but stable  Edema left flank.   Softer.  Nondistended.  Nontender.  No guarding GU:  NEMG Ext:  SCDs BLE.  No mjr edema.  Right index finger pad with purple demarcation but + sensation.  Nail bed intact   PICC LUE clean with mild edema Skin: No more anasarca.  No other petechiae / purpura  Results:   Diagnosis Stomach, biopsy - CHRONIC FOCALLY ACTIVE GASTRITIS. - THERE IS NO EVIDENCE OF HELICOBACTER PYLORI, DYSPLASIA OR MALIGNANCY. - SEE COMMENT. Microscopic Comment A Warthin-Starry stain is negative for the presence of Helicobacter pylori organisms. (JBK:gt, 12/04/15) Enid Cutter MD Pathologist, Electronic Signature (Case signed 12/04/2015) Specimen Chinenye Katzenberger and Clinical Information Specimen(s) Obtained: Stomach, biopsy Specimen  Clinical  Labs: Results for orders placed or performed during the hospital encounter of 11/24/15 (from the past 48 hour(s))  Comprehensive metabolic panel     Status: Abnormal   Collection Time: 12/28/15  4:19 AM  Result Value Ref Range   Sodium 154 (H) 135 - 145 mmol/L   Potassium 3.7 3.5 - 5.1 mmol/L   Chloride 124 (H) 101 - 111 mmol/L   CO2 24 22 - 32 mmol/L   Glucose, Bld 132 (H) 65 - 99 mg/dL   BUN 69 (H) 6 - 20 mg/dL   Creatinine, Ser 1.25 (H) 0.61 - 1.24 mg/dL   Calcium 7.3 (L) 8.9 - 10.3 mg/dL   Total Protein 5.8 (L) 6.5 - 8.1 g/dL   Albumin 1.6 (L) 3.5 - 5.0 g/dL   AST 42 (H) 15 - 41 U/L   ALT 51 17 - 63 U/L   Alkaline Phosphatase 340 (H) 38 - 126 U/L   Total Bilirubin 0.8 0.3 - 1.2 mg/dL   GFR calc non Af Amer 54 (L) >60 mL/min   GFR calc Af Amer >60 >60 mL/min    Comment: (NOTE) The eGFR has been calculated using the CKD EPI equation. This calculation has not been validated in all clinical situations. eGFR's persistently <60 mL/min signify possible Chronic Kidney Disease.    Anion gap 6 5 - 15  Prealbumin     Status: Abnormal   Collection Time: 12/28/15  4:19 AM  Result Value Ref Range   Prealbumin 15.3 (L) 18 - 38 mg/dL    Comment: Performed at [facility]  Magnesium     Status: None   Collection Time: 12/28/15  4:19 AM  Result Value Ref Range   Magnesium 2.1 1.7 - 2.4 mg/dL  Phosphorus     Status: None   Collection Time: 12/28/15  4:19 AM  Result Value Ref Range   Phosphorus 2.8 2.5 - 4.6 mg/dL    Imaging / Studies: No results found.  Medications / Allergies: per chart  Antibiotics: Anti-infectives    Start     Dose/Rate Route Frequency Ordered Stop   12/09/15 1200  anidulafungin (ERAXIS) 100 mg in sodium chloride 0.9 % 100 mL IVPB     100 mg over 90 Minutes Intravenous Every 24 hours 12/09/15 0918 12/10/15 1333   12/05/15 1200  anidulafungin (ERAXIS) 100 mg in sodium chloride 0.9 % 100 mL IVPB  Status:  Discontinued     100 mg over 90  Minutes Intravenous Every 24 hours 12/04/15 1045 12/09/15 0835   12/04/15 1200  anidulafungin (ERAXIS) 200 mg in sodium chloride 0.9 % 200 mL IVPB     200 mg over 180 Minutes Intravenous  Once 12/04/15 1045 12/04/15 1725   12/04/15 0600  cefoTEtan (CEFOTAN) 2 g in dextrose 5 % 50 mL IVPB     2  g 100 mL/hr over 30 Minutes Intravenous On call to O.R. 12/03/15 1821 12/03/15 2007   12/04/15 0400  piperacillin-tazobactam (ZOSYN) IVPB 3.375 g  Status:  Discontinued     3.375 g 12.5 mL/hr over 240 Minutes Intravenous Every 8 hours 12/03/15 2229 12/15/15 0751   12/02/15 0000  erythromycin ethylsuccinate (EES) 200 MG/5ML suspension 400 mg  Status:  Discontinued    Comments:  Administer via J tube (16Fr LLQ)   400 mg Oral Every 6 hours 12/01/15 2020 12/03/15 0748   11/27/15 1200  erythromycin 500 mg in sodium chloride 0.9 % 100 mL IVPB     500 mg 100 mL/hr over 60 Minutes Intravenous Every 6 hours 11/27/15 0940 11/29/15 0608   11/26/15 0830  erythromycin 250 mg in sodium chloride 0.9 % 100 mL IVPB  Status:  Discontinued     250 mg 100 mL/hr over 60 Minutes Intravenous Every 6 hours 11/26/15 0751 11/27/15 0940        Note: Portions of this report may have been transcribed using voice recognition software. Every effort was made to ensure accuracy; however, inadvertent computerized transcription errors may be present.   Any transcriptional errors that result from this process are unintentional.    [name], M.D., F.A.C.S. Gastrointestinal and Minimally Invasive Surgery [facility] [address], [place], [place] [address] [phone] Main / Paging     12/29/2015

## 2015-12-29 NOTE — Clinical Social Work Note (Signed)
MSW and [facility] met with patient, wife and daughter at bedside in reference to discharge planning. Pt's wife and dtr are prepared for a potential discharge on Wednesday, 11/15. Patient has a bed at [facility]. Facility is aware and prepared for patient's discharge on tomorrow, 11/15.   No further concerns reported at this time by family. MSW will continue to follow patient and pt's family for continued support and to facilitate pt's discharge needs once medically stable.   Glendon Axe, MSW [phone] 12/29/2015 11:41 AM

## 2015-12-29 NOTE — Progress Notes (Signed)
Nutrition Follow-up  DOCUMENTATION CODES:   Severe malnutrition in context of acute illness/injury  INTERVENTION:   Transition to Bolus feed regimen: Provide 1 can of Jevity 1.2 TID via G-tube (1100, 1500, 1900). Provides 855 kcal, 39g protein and 573 ml H2O. Will infuse Vital AF 1.2 @ 65 ml/hr over 12 hours tonight via J-tube. Provides 936 kcal, 58g protein and 632 ml H2O. Provide 30 ml Prostat once daily. Continue free water flushes per surgery (300 ml every 8 hours, 900 ml total) This tube feeding regimen in total will provide 1891 kcal (99% of needs), 112 g protein and 2105 ml H2O.  D/c Ensure Enlive order.  Goal rate for bolus regimen: 6 cans daily of Jevity 1.2 w/ 60 ml Prostat daily.  Continue free water flushes of 300 ml TID. Provides 1910 kcal (100% of needs), 109g protein and 2046 ml H2O.  RD to continue to monitor for tolerance and advancement to complete transition to bolus feeds.  NUTRITION DIAGNOSIS:   Inadequate oral intake related to inability to eat as evidenced by NPO status.  Ongoing.  GOAL:   Patient will meet greater than or equal to 90% of their needs  Meeting with TF.  MONITOR:   TF tolerance, Weight trends, Labs, Skin, I & O's  REASON FOR ASSESSMENT:   Consult Enteral/tube feeding initiation and management  ASSESSMENT:   75 year old male presenting for a post-operative visit. Note for "Post-Operative": Patient returns one month status post robotic resection.  Distal gastrectomy with Billroth I gastroduodenal reconstruction for chronic ulcer causing obstruction.  09/25/2015  11/14: Patient was transitioned to bolus feeds 11/13 with Ensure Enlive QID (providing 1400 kcal, 80g protein). RD will adjust feedings to 3 cans daily of Jevity 1.2 via G-tube. Will also run a continuous feeding of Vital AF 1.2 overnight @ 65 ml/hr over 12 hours. Along with 30 ml Prostat once daily this will meet 100% of kcal and protein needs. Will continue free water  flushes of 300 ml every 8 hours to meet fluid needs. Goal rate provided above. Hopefully can transition to goal tomorrow.  Pt had MBS performed today. Per discussion with RN, pt's swallowing ability has worsened per SLP. Will await note for further details.   Patient's weight has decreased by 12 lb since admission weight on 10/10.   Medications reviewed. Labs reviewed: CBGs: Elevated Na Mg/Phos WNL  11/12: Pt in room with RN and wife at bedside. RN states pt has been tolerating TF at goal via J-tube with no issues. Pt's wife states he has been tolerating his medications through the G-tube as well. Recommend transition feeds via G-tube. RN states Dr. Michaell CowingGross has not been covering the weekend, will likely transition feeds once covering care again.  SLP evaluated patient 11/10: recommend repeat MBS sometime this upcoming week.   11/10 -TPN d/c'ed yesterday PM. - Per Dr. Michaell CowingGross' note this AM: G tube clamping trial since lower output &less leaking. LIWS if worse. Site care. Abd binder to cover tubes. Hopefully as malnutrition &ileus resolve, will get easier. Try to use G tube for meds since ileus seems to be resolving. If tolerates clamping, may transition to bolus TF via G tube &wean off J tube continuous feeds - Once TF able to be provided via G-tube, likely to switch to a polymeric TF formula and will have the ability to add protein modulars if needed via that route.   Diet Order:  Diet NPO time specified Except for: Ice Chips  Skin:  Wound (see comment) (Abdominal incision)  Last BM:  11/13  Height:   Ht Readings from Last 1 Encounters:  12/08/15 6\' 2"  (1.88 m)    Weight:   Wt Readings from Last 1 Encounters:  12/29/15 167 lb 15.9 oz (76.2 kg)    Ideal Body Weight:  80.9 kg  BMI:  Body mass index is 21.57 kg/m.  Estimated Nutritional Needs:   Kcal:  1900-2100   Protein:  100-115g  Fluid:  per MD/NP/Surgery  EDUCATION NEEDS:   No education needs identified at  this time  Danny FrancoLindsey Prisha Hiley, MS, RD, LDN Pager: [phone] After Hours Pager: [phone]

## 2015-12-29 NOTE — Care Management Important Message (Signed)
Important Message  Patient Details  Name: [name] MRN: [id number] Date of Birth: Apr 02, 1940   Medicare Important Message Given:  Yes    Haskell FlirtJamison, Arthuro Canelo 12/29/2015, 10:23 AMImportant Message  Patient Details  Name: [name] MRN: [id number] Date of Birth: Apr 02, 1940   Medicare Important Message Given:  Yes    Haskell FlirtJamison, Cuauhtemoc Huegel 12/29/2015, 10:23 AM

## 2015-12-30 DIAGNOSIS — L89310 Pressure ulcer of right buttock, unstageable: Secondary | ICD-10-CM | POA: Insufficient documentation

## 2015-12-30 MED ORDER — JEVITY 1.2 CAL PO LIQD
474.0000 mL | Freq: Three times a day (TID) | ORAL | Status: DC
  Filled 2015-12-30 (×3): qty 474

## 2015-12-30 MED ORDER — VITAMIN B-12 1000 MCG PO TABS
1000.0000 ug | ORAL_TABLET | Freq: Every day | ORAL | Status: DC
Start: 2015-12-30 — End: 2015-12-30

## 2015-12-30 MED ORDER — PRO-STAT SUGAR FREE PO LIQD
60.0000 mL | Freq: Every day | ORAL | Status: DC
  Administered 2015-12-30: 60 mL
  Filled 2015-12-30: qty 60

## 2015-12-30 MED ORDER — LOPERAMIDE HCL 1 MG/5ML PO LIQD: 2.0000 mg | ORAL | 0 refills | Status: DC | PRN

## 2015-12-30 MED ORDER — ADULT MULTIVITAMIN W/MINERALS CH
1.0000 | ORAL_TABLET | Freq: Every day | ORAL | Status: DC
  Administered 2015-12-30: 1 via ORAL
  Filled 2015-12-30: qty 1

## 2015-12-30 MED ORDER — ATORVASTATIN CALCIUM 40 MG PO TABS: 40.0000 mg | ORAL_TABLET | Freq: Every day | ORAL | Status: DC

## 2015-12-30 MED ORDER — FREE WATER: 300.0000 mL | Freq: Three times a day (TID) | Status: DC

## 2015-12-30 MED ORDER — BISMUTH SUBSALICYLATE 262 MG/15ML PO SUSP: 30.0000 mL | Freq: Three times a day (TID) | ORAL | 0 refills | Status: DC | PRN

## 2015-12-30 MED ORDER — METOPROLOL TARTRATE 25 MG PO TABS: 12.5000 mg | ORAL_TABLET | Freq: Every day | ORAL | 5 refills | Status: DC

## 2015-12-30 MED ORDER — MAGIC MOUTHWASH: 15.0000 mL | Freq: Four times a day (QID) | ORAL | 5 refills | Status: DC | PRN

## 2015-12-30 MED ORDER — ASPIRIN EC 81 MG PO TBEC: 81.0000 mg | DELAYED_RELEASE_TABLET | Freq: Every evening | ORAL | Status: DC

## 2015-12-30 MED ORDER — CALCIUM CARBONATE-VITAMIN D 500-200 MG-UNIT PO TABS
1.0000 | ORAL_TABLET | Freq: Every day | ORAL | Status: DC
Start: 2015-12-30 — End: 2015-12-30
  Administered 2015-12-30: 1 via ORAL
  Filled 2015-12-30: qty 1

## 2015-12-30 MED ORDER — ALUM & MAG HYDROXIDE-SIMETH 200-200-20 MG/5ML PO SUSP: 30.0000 mL | Freq: Four times a day (QID) | ORAL | 0 refills | Status: DC | PRN

## 2015-12-30 MED ORDER — LOPERAMIDE HCL 1 MG/5ML PO LIQD
2.0000 mg | Freq: Three times a day (TID) | ORAL | Status: DC | PRN
  Filled 2015-12-30: qty 10

## 2015-12-30 MED ORDER — HYDROGEN PEROXIDE 3 % EX SOLN
CUTANEOUS | Status: AC
  Filled 2015-12-30: qty 473

## 2015-12-30 MED ORDER — JEVITY 1.2 CAL PO LIQD
474.0000 mL | Freq: Three times a day (TID) | ORAL | 5 refills | Status: DC
Start: 2015-12-30 — End: 2016-01-12

## 2015-12-30 MED ORDER — PRO-STAT SUGAR FREE PO LIQD
30.0000 mL | Freq: Every day | ORAL | 0 refills | Status: DC
Start: 2015-12-30 — End: 2016-01-15

## 2015-12-30 MED ORDER — HALOPERIDOL 1 MG PO TABS: 1.0000 mg | ORAL_TABLET | Freq: Every evening | ORAL | 1 refills | Status: DC | PRN

## 2015-12-30 MED ORDER — ZINC OXIDE 40 % EX OINT: TOPICAL_OINTMENT | Freq: Two times a day (BID) | CUTANEOUS | 0 refills | Status: DC

## 2015-12-30 MED ORDER — BISACODYL 10 MG RE SUPP: 10.0000 mg | Freq: Every day | RECTAL | Status: DC | PRN

## 2015-12-30 MED ORDER — BISACODYL 10 MG RE SUPP: 10.0000 mg | Freq: Every day | RECTAL | 0 refills | Status: DC | PRN

## 2015-12-30 NOTE — Consult Note (Signed)
WOC Nurse wound consult note Reason for Consult:Moisture lesion on right inner gluteal cleft, partial; thickness Wound type:Moisture Pressure Ulcer POA:No Measurement:1cm, x 0.6cm x 0.1 Wound NWG:NFAObed:pink, moist Drainage (amount, consistency, odor) scant serous Periwound: Intact Dressing procedure/placement/frequency: We will continue to coat area with clear zinc product and I have today reinforced to patient and family how important staying off the area will be to reepithelialization.  I will proved bilateral pressure redistribution heel boots for patient while in bed. WOC nursing team will not follow, but will remain available to this patient, the nursing and medical teams.  Please re-consult if needed. Thanks, Ladona MowLaurie Maigan Bittinger, MSN, RN, GNP, Hans EdenCWOCN, CWON-AP, FAAN  Pager# [phone]

## 2015-12-30 NOTE — Discharge Instructions (Signed)
Care of a Feeding Tube People who have trouble swallowing or cannot take food or medicine by mouth are sometimes given feeding tubes. A feeding tube can go into the nose and down to the stomach or through the skin in the abdomen and into the stomach or small bowel. Some of the names of these feeding tubes are gastrostomy tubes, PEG lines, nasogastric tubes, and gastrojejunostomy tubes. Supplies needed to care for the tube site:  Clean gloves.  Clean wash cloth, gauze pads, or soft paper towel.  Cotton swabs.  Skin barrier ointment or cream.  Soap and water.  Pre-cut foam pads or gauze (that go around the tube).  Tube tape. Tube site care 1. Have all supplies ready and available. 2. Wash hands well. 3. Put on clean gloves. 4. Remove the soiled foam pad or gauze, if present, that is found under the tube stabilizer. Change the foam pad or gauze daily or when soiled or moist. 5. Check the skin around the tube site for redness, rash, swelling, drainage, or extra tissue growth. If you notice any of these, call your caregiver. 6. Moisten gauze and cotton swabs with water and soap. 7. Wipe the area closest to the tube (right near the stoma) with cotton swabs. Wipe the surrounding skin with moistened gauze. Rinse with water. 8. Dry the skin and stoma site with a dry gauze pad or soft paper towel. Do not use antibiotic ointments at the tube site. 9. If the skin is red, apply a skin barrier cream or ointment (such as petroleum jelly) in a circular motion, using a cotton swab. The cream or ointment will provide a moisture barrier for the skin and helps with wound healing. 10. Apply a new pre-cut foam pad or gauze around the tube. Secure it with tape around the edges. If no drainage is present, foam pads or gauze may be left off. 11. Use tape or an anchoring device to fasten the feeding tube to the skin for comfort or as directed. Rotate where you tape the tube to avoid skin damage from the  adhesive. 12. Position the person in a semi-upright position (30-45 degree angle). 13. Throw away used supplies. 14. Remove gloves. 15. Wash hands. Supplies needed to flush a feeding tube:  Clean gloves.  60 mL syringe (that connects to the feeding tube).  Towel.  Water. Flushing a feeding tube 1. Have all supplies ready and available. 2. Wash hands well. 3. Put on clean gloves. 4. Draw up 30 mL of water in the syringe. 5. Kink the feeding tube while disconnecting it from the feeding-bag tubing or while removing the plug at the end of the tube. Kinking closes the tube and prevents secretions in the tube from spilling out. 6. Insert the tip of the syringe into the end of the feeding tube. Release the kink. Slowly inject the water. 7. If unable to inject the water, the person with the feeding tube should lay on his or her left side. The tip of the tube may be against the stomach wall, blocking fluid flow. Changing positions may move the tip away from the stomach wall. After repositioning, try injecting the water again.  Do not use a syringe smaller than 60 mL to flush the tubing.  Do not use excessive force to overcome resistance because this could cause the tube to rupture. 8. After injecting the water, remove the syringe. 9. Always flush before giving the first medicine, between medicines, and after the final medicine before starting  a feeding. This prevents medicines from clogging the tube.  Do not mix medicines with formula or with other medicines before giving medicines.  Thoroughly flush medicines through the tube so they do not mix with formula. 10. Throw away used supplies. 11. Remove gloves. 12. Wash hands. This information is not intended to replace advice given to you by your health care provider. Make sure you discuss any questions you have with your health care provider. Document Released: 01/31/2005 Document Revised: 07/15/2015 Document Reviewed: 09/15/2011 Elsevier  Interactive Patient Education  2017 [employer]  Gastrostomy Tube Replacement Introduction A gastrostomy tube replacement is a procedure to change the tube that goes into your stomach. This may be a planned procedure, or it may be an emergency procedure if your tube has come out or is not working. Tell a health care provider about:  Any allergies you have.  All medicines you are taking, including vitamins, herbs, eye drops, creams, and over-the-counter medicines.  Any problems you or family members have had with anesthetic medicines.  Any blood disorders you have.  Any surgeries you have had.  Any medical conditions you have. What are the risks? Generally, this is a safe procedure. However, problems can occur and include:  Bleeding.  Infection.  Leaking. What happens before the procedure?  Let your health care provider know how long you have had your gastrostomy tube. If your tube has been in place for less than two weeks, you may need another surgical placement procedure, not just a replacement.  If your tube has come out at home, bring the tube with you so your health care provider can see the type you are using. What happens during the procedure?  You may be given a medicine that numbs the insertion area (local anesthetic).  If your gastrostomy tube is partially displaced, or is in place but not working, it will be removed.  If you have an inflatable tube, your health care provider may deflate the balloon at the end of the tube with a syringe.  Your health care provider will apply pressure to your belly as the tube is pulled out. Then the health care provider will gently probe the opening of your gastrostomy to check it.  The opening for the gastrostomy tube may be lubricated with a jelly-like ointment.  You will get a new tube.  If the new tube does not go in easily, you may have a smaller tube put in to keep the track open.  The new tube will be secured in  place. What happens after the procedure? You may have an X-ray to make sure the new tube is in the right place and is working well. To do this, your health care provider will put a liquid that shows up on X-rays through the tube. The X-ray checks that the fluid is not leaking outside of your stomach. This information is not intended to replace advice given to you by your health care provider. Make sure you discuss any questions you have with your health care provider. Document Released: 10/26/2000 Document Revised: 07/09/2015 Document Reviewed: 06/12/2013  2017 Elsevier  Gastrostomy Tube Home Guide, Adult A gastrostomy tube is a tube that is surgically placed into the stomach. It is also called a G-tube. G-tubes are used when a person is unable to eat and drink enough on their own to stay healthy. The tube is inserted into the stomach through a small cut (incision) in the skin. This tube is used for:  Feeding.  Giving medication. Gastrostomy tube care  Wash your hands with soap and water.  Remove the old dressing (if any). Some styles of G-tubes may need a dressing inserted between the skin and the G-tube. Other types of G-tubes do not require a dressing. Ask your health care provider if a dressing is needed.  Check the area where the tube enters the skin (insertion site) for redness, swelling, or pus-like (purulent) drainage. A small amount of clear or tan liquid drainage is normal. Check to make sure scar tissue (skin) is not growing around the insertion site. This could have a raised, bumpy appearance.  A cotton swab can be used to clean the skin around the tube:  When the G-tube is first put in, a normal saline solution or water can be used to clean the skin.  Mild soap and warm water can be used when the skin around the G-tube site has healed.  Roll the cotton swab around the G-tube insertion site to remove any drainage or crusting at the insertion site. Stomach residuals Feeding  tube residuals are the amount of liquids that are in the stomach at any given time. Residuals may be checked before giving feedings, medications, or as instructed by your health care provider.  Ask your health care provider if there are instances when you would not start tube feedings depending on the amount or type of contents withdrawn from the stomach.  Check residuals by attaching a syringe to the G-tube and pulling back on the syringe plunger. Note the amount, and return the residual back into the stomach. Flushing the G-tube  The G-tube should be periodically flushed with clean warm water to keep it from clogging.  Flush the G-tube after feedings or medications. Draw up 30 mL of warm water in a syringe. Connect the syringe to the G-tube and slowly push the water into the tube.  Do not push feedings, medications, or flushes rapidly. Flush the G-tube gently and slowly.  Only use syringes made for G-tubes to flush medications or feedings.  Your health care provider may want the G-tube flushed more often or with more water. If this is the case, follow your health care provider's instructions. Feedings Your health care provider will determine whether feedings are given as a bolus (a certain amount given at one time and at scheduled times) or whether feedings will be given continuously on a feeding pump.  Formulas should be given at room temperature.  If feedings are continuous, no more than 4 hours worth of feedings should be placed in the feeding bag. This helps prevent spoilage or accidental excess infusion.  Cover and place unused formula in the refrigerator.  If feedings are continuous, stop the feedings when medications or flushes are given. Be sure to restart the feedings.  Feeding bags and syringes should be replaced as instructed by your health care provider. Giving medication  In general, it is best if all medications are in a liquid form for G-tube administration. Liquid  medications are less likely to clog the G-tube.  Mix the liquid medication with 30 mL (or amount recommended by your health care provider) of warm water.  Draw up the medication into the syringe.  Attach the syringe to the G-tube and slowly push the mixture into the G-tube.  After giving the medication, draw up 30 mL of warm water in the syringe and slowly flush the G-tube.  For pills or capsules, check with your health care provider first before crushing medications. Some pills  are not effective if they are crushed. Some capsules are sustained-release medications.  If appropriate, crush the pill or capsule and mix with 30 mL of warm water. Using the syringe, slowly push the medication through the tube, then flush the tube with another 30 mL of tap water. G-tube problems G-tube was pulled out.  Cause: May have been pulled out accidentally.  Solutions: Cover the opening with clean dressing and tape. Call your health care provider right away. The G-tube should be put in as soon as possible (within 4 hours) so the G-tube opening (tract) does not close. The G-tube needs to be put in at a health care setting. An X-ray needs to be done to confirm placement before the G-tube can be used again. Redness, irritation, soreness, or foul odor around the gastrostomy site.  Cause: May be caused by leakage or infection.  Solutions: Call your health care provider right away. Large amount of leakage of fluid or mucus-like liquid present (a large amount means it soaks clothing).  Cause: Many reasons could cause the G-tube to leak.  Solutions: Call your health care provider to discuss the amount of leakage. Skin or scar tissue appears to be growing where tube enters skin.  Cause: Tissue growth may develop around the insertion site if the G-tube is moved or pulled on excessively.  Solutions: Secure tube with tape so that excess movement does not occur. Call your health care provider. G-tube is  clogged.  Cause: Thick formula or medication.  Solutions: Try to slowly push warm water into the tube with a large syringe. Never try to push any object into the tube to unclog it. Do not force fluid into the G-tube. If you are unable to unclog the tube, call your health care provider right away. Tips  Head of bed (HOB) position refers to the upright position of a person's upper body.  When giving medications or a feeding bolus, keep the [facility] up as told by your health care provider. Do this during the feeding and for 1 hour after the feeding or medication administration.  If continuous feedings are being given, it is best to keep the [facility] up as told by your health care provider. When ADLs (activities of daily living) are performed and the [facility] needs to be flat, be sure to turn the feeding pump off. Restart the feeding pump when the [facility] is returned to the recommended height.  Do not pull or put tension on the tube.  To prevent fluid backflow, kink the G-tube before removing the cap or disconnecting a syringe.  Check the G-tube length every day. Measure from the insertion site to the end of the G-tube. If the length is longer than previous measurements, the tube may be coming out. Call your health care provider if you notice increasing G-tube length.  Oral care, such as brushing teeth, must be continued.  You may need to remove excess air (vent) from the G-tube. Your health care provider will tell you if this is needed.  Always call your health care provider if you have questions or problems with the G-tube. Get help right away if:  You have severe abdominal pain, tenderness, or abdominal bloating (distension).  You have nausea or vomiting.  You are constipated or have problems moving your bowels.  The G-tube insertion site is red, swollen, has a foul smell, or has yellow or brown drainage.  You have difficulty breathing or shortness of breath.  You have a fever.  You have a  large  amount of feeding tube residuals.  The G-tube is clogged and cannot be flushed. This information is not intended to replace advice given to you by your health care provider. Make sure you discuss any questions you have with your health care provider. Document Released: 04/11/2001 Document Revised: 07/09/2015 Document Reviewed: 10/08/2012 Elsevier Interactive Patient Education  2017 [employer].  SURGERY: POST OP INSTRUCTIONS (Surgery for small bowel obstruction, colon resection, etc)   ######################################################################  EAT Gradually transition to a high fiber diet with a fiber supplement over the next few days after discharge  WALK Walk an hour a day.  Control your pain to do that.    CONTROL PAIN Control pain so that you can walk, sleep, tolerate sneezing/coughing, go up/down stairs.  HAVE A BOWEL MOVEMENT DAILY Keep your bowels regular to avoid problems.  OK to try a laxative to override constipation.  OK to use an antidairrheal to slow down diarrhea.  Call if not better after 2 tries  CALL IF YOU HAVE PROBLEMS/CONCERNS Call if you are still struggling despite following these instructions. Call if you have concerns not answered by these instructions  ######################################################################   DIET Continue full nutrition through your feeding tubes.  Hopefully a strength comes back, swallowing will improve.  Work with speech therapy to improve her swallowing.  When she were eating normally, we can remove your gastrostomy and jejunostomy feeding tubes.  It is expected for your digestive tract to need a few months to get back to normal.  It is common for your bowel movements and stools to be irregular.  You will have occasional bloating and cramping that should eventually fade away.  Until you are eating solid food normally, off all pain medications, and back to regular activities; your bowels will not be  normal. Focus on eating a low-fat, high fiber diet the rest of your life (See Getting to Good Bowel Health, below).  CARE of your INCISION or WOUND It is good for closed incision and even open wounds to be washed every day.  Shower every day.  Short baths are fine.  Wash the incisions and wounds clean with soap & water.    If you have a closed incision(s), wash the incision with soap & water every day.  You may leave closed incisions open to air if it is dry.   You may cover the incision with clean gauze & replace it after your daily shower for comfort. If you have skin tapes (Steristrips) or skin glue (Dermabond) on your incision, leave them in place.  They will fall off on their own like a scab.  You may trim any edges that curl up with clean scissors.  If you have staples, set up an appointment for them to be removed in the office in 10 days after surgery.  If you have a drain, wash around the skin exit site with soap & water and place a new dressing of gauze or band aid around the skin every day.  Keep the drain site clean & dry.    If you have an open wound with packing, see wound care instructions.  In general, it is encouraged that you remove your dressing and packing, shower with soap & water, and replace your dressing once a day.  Pack the wound with clean gauze moistened with normal (0.9%) saline to keep the wound moist & uninfected.  Pressure on the dressing for 30 minutes will stop most wound bleeding.  Eventually your body will heal &  pull the open wound closed over the next few months.  Raw open wounds will occasionally bleed or secrete yellow drainage until it heals closed.  Drain sites will drain a little until the drain is removed.  Even closed incisions can have mild bleeding or drainage the first few days until the skin edges scab over & seal.   If you have an open wound with a wound vac, see wound vac care instructions.     ACTIVITIES as tolerated Start light daily activities ---  self-care, walking, climbing stairs-- beginning the day after surgery.  Gradually increase activities as tolerated.  Control your pain to be active.  Stop when you are tired.  Ideally, walk several times a day, eventually an hour a day.   Most people are back to most day-to-day activities in a few weeks.  It takes 4-8 weeks to get back to unrestricted, intense activity. If you can walk 30 minutes without difficulty, it is safe to try more intense activity such as jogging, treadmill, bicycling, low-impact aerobics, swimming, etc. Save the most intensive and strenuous activity for last (Usually 4-8 weeks after surgery) such as sit-ups, heavy lifting, contact sports, etc.  Refrain from any intense heavy lifting or straining until you are off narcotics for pain control.  You will have off days, but things should improve week-by-week. DO NOT PUSH THROUGH PAIN.  Let pain be your guide: If it hurts to do something, don't do it.  Pain is your body warning you to avoid that activity for another week until the pain goes down. You may drive when you are no longer taking narcotic prescription pain medication, you can comfortably wear a seatbelt, and you can safely make sudden turns/stops to protect yourself without hesitating due to pain. You may have sexual intercourse when it is comfortable. If it hurts to do something, stop.  MEDICATIONS Take your usually prescribed home medications unless otherwise directed.   Blood thinners:  Usually you can restart any strong blood thinners after the second postoperative day.  It is OK to take aspirin right away.     If you are on strong blood thinners (warfarin/Coumadin, Plavix, Xerelto, Eliquis, Pradaxa, etc), discuss with your surgeon, medicine PCP, and/or cardiologist for instructions on when to restart the blood thinner & if blood monitoring is needed (PT/INR blood check, etc).     PAIN CONTROL Pain after surgery or related to activity is often due to strain/injury to  muscle, tendon, nerves and/or incisions.  This pain is usually short-term and will improve in a few months.  To help speed the process of healing and to get back to regular activity more quickly, DO THE FOLLOWING THINGS TOGETHER: 1. Increase activity gradually.  DO NOT PUSH THROUGH PAIN 2. Use Ice and/or Heat 3. Try Gentle Massage and/or Stretching 4. Take over the counter pain medication 5. Take Narcotic prescription pain medication for more severe pain  Good pain control = faster recovery.  It is better to take more medicine to be more active than to stay in bed all day to avoid medications. 1.  Increase activity gradually Avoid heavy lifting at first, then increase to lifting as tolerated over the next 6 weeks. Do not push through the pain.  Listen to your body and avoid positions and maneuvers than reproduce the pain.  Wait a few days before trying something more intense Walking an hour a day is encouraged to help your body recover faster and more safely.  Start slowly and stop  when getting sore.  If you can walk 30 minutes without stopping or pain, you can try more intense activity (running, jogging, aerobics, cycling, swimming, treadmill, sex, sports, weightlifting, etc.) Remember: If it hurts to do it, then dont do it! 2. Use Ice and/or Heat You will have swelling and bruising around the incisions.  This will take several weeks to resolve. Ice packs or heating pads (6-8 times a day, 30-60 minutes at a time) will help sooth soreness & bruising. Some people prefer to use ice alone, heat alone, or alternate between ice & heat.  Experiment and see what works best for you.  Consider trying ice for the first few days to help decrease swelling and bruising; then, switch to heat to help relax sore spots and speed recovery. Shower every day.  Short baths are fine.  It feels good!  Keep the incisions and wounds clean with soap & water.   3. Try Gentle Massage and/or Stretching Massage at the area  of pain many times a day Stop if you feel pain - do not overdo it 4. Take over the counter pain medication This helps the muscle and nerve tissues become less irritable and calm down faster Choose ONE of the following over-the-counter anti-inflammatory medications: Acetaminophen 500mg  tabs (Tylenol) 1-2 pills with every meal and just before bedtime (avoid if you have liver problems or if you have acetaminophen in you narcotic prescription) Take with food/snack several times a day as directed for at least 2 weeks to help keep pain / soreness down & more manageable. 5. Take Narcotic prescription pain medication for more severe pain A prescription for strong pain control is often given to you upon discharge (for example: oxycodone/Percocet, hydrocodone/Norco/Vicodin, or tramadol/Ultram) Take your pain medication as prescribed. Be mindful that most narcotic prescriptions contain Tylenol (acetaminophen) as well - avoid taking too much Tylenol. If you are having problems/concerns with the prescription medicine (does not control pain, nausea, vomiting, rash, itching, etc.), please call us [phone] to see if we need to switch you to a different pain medicine that will work better for you and/or control your side effects better. If you need a refill on your pain medication, you must call the office before 4 pm and on weekdays only.  By federal law, prescriptions for narcotics cannot be called into a pharmacy.  They must be filled out on paper & picked up from our office by the patient or authorized caretaker.  Prescriptions cannot be filled after 4 pm nor on weekends.    WHEN TO CALL US [phone] Severe uncontrolled or worsening pain  Fever over 101 F (38.5 C) Concerns with the incision: Worsening pain, redness, rash/hives, swelling, bleeding, or drainage Reactions / problems with new medications (itching, rash, hives, nausea, etc.) Nausea and/or vomiting Difficulty urinating Difficulty  breathing Worsening fatigue, dizziness, lightheadedness, blurred vision Other concerns If you are not getting better after two weeks or are noticing you are getting worse, contact our office [phone] for further advice.  We may need to adjust your medications, re-evaluate you in the office, send you to the emergency room, or see what other things we can do to help. The clinic staff is available to answer your questions during regular business hours (8:30am-5pm).  Please dont hesitate to call and ask to speak to one of our nurses for clinical concerns.    [facility] is always on call at the hospitals 24 hours/day If you have a medical  emergency, go to the nearest emergency room or call 911.  FOLLOW UP in our office One the day of your discharge from the hospital (or the next business weekday), please call [facility] to set up or confirm an appointment to see your surgeon in the office for a follow-up appointment.  Usually it is 2-3 weeks after your surgery.   If you have skin staples at your incision(s), let the office know so we can set up a time in the office for the nurse to remove them (usually around 10 days after surgery). Make sure that you call for appointments the day of discharge (or the next business weekday) from the hospital to ensure a convenient appointment time. IF YOU HAVE DISABILITY OR FAMILY LEAVE FORMS, BRING THEM TO THE OFFICE FOR PROCESSING.  DO NOT GIVE THEM TO YOUR DOCTOR.  [facility] [address], [address], [place], [place]  [address] ? [phone] - Main [phone] - Toll Free,  [phone] - Fax [email or web address]  GETTING TO GOOD BOWEL HEALTH. It is expected for your digestive tract to need a few months to get back to normal.  It is common for your bowel movements and stools to be irregular.  You will have occasional bloating and cramping that should eventually fade away.   Until you are eating solid food normally, off all pain medications, and back to regular activities; your bowels will not be normal.   Avoiding constipation The goal: ONE SOFT BOWEL MOVEMENT A DAY!    Drink plenty of fluids.  Choose water first. TAKE A FIBER SUPPLEMENT EVERY DAY THE REST OF YOUR LIFE During your first week back home, gradually add back a fiber supplement every day Experiment which form you can tolerate.   There are many forms such as powders, tablets, wafers, gummies, etc Psyllium bran (Metamucil), methylcellulose (Citrucel), Miralax or Glycolax, Benefiber, Flax Seed.  Adjust the dose week-by-week (1/2 dose/day to 6 doses a day) until you are moving your bowels 1-2 times a day.  Cut back the dose or try a different fiber product if it is giving you problems such as diarrhea or bloating. Sometimes a laxative is needed to help jump-start bowels if constipated until the fiber supplement can help regulate your bowels.  If you are tolerating eating & you are farting, it is okay to try a gentle laxative such as double dose MiraLax, prune juice, or Milk of Magnesia.  Avoid using laxatives too often. Stool softeners can sometimes help counteract the constipating effects of narcotic pain medicines.  It can also cause diarrhea, so avoid using for too long. If you are still constipated despite taking fiber daily, eating solids, and a few doses of laxatives, call our office. Controlling diarrhea Try drinking liquids and eating bland foods for a few days to avoid stressing your intestines further. Avoid dairy products (especially milk & ice cream) for a short time.  The intestines often can lose the ability to digest lactose when stressed. Avoid foods that cause gassiness or bloating.  Typical foods include beans and other legumes, cabbage, broccoli, and dairy foods.  Avoid greasy, spicy, fast foods.  Every person has some sensitivity to other foods, so listen to your body and avoid those foods  that trigger problems for you. Probiotics (such as active yogurt, Align, etc) may help repopulate the intestines and colon with normal bacteria and calm down a sensitive digestive tract Adding a fiber supplement gradually can help thicken stools by absorbing  excess fluid and retrain the intestines to act more normally.  Slowly increase the dose over a few weeks.  Too much fiber too soon can backfire and cause cramping & bloating. It is okay to try and slow down diarrhea with a few doses of antidiarrheal medicines.   Bismuth subsalicylate (ex. Kayopectate, Pepto Bismol) for a few doses can help control diarrhea.  Avoid if pregnant.   Loperamide (Imodium) can slow down diarrhea.  Start with one tablet (2mg ) first.  Avoid if you are having fevers or severe pain.  ILEOSTOMY PATIENTS WILL HAVE CHRONIC DIARRHEA since their colon is not in use.    Drink plenty of liquids.  You will need to drink even more glasses of water/liquid a day to avoid getting dehydrated. Record output from your ileostomy.  Expect to empty the bag every 3-4 hours at first.  Most people with a permanent ileostomy empty their bag 4-6 times at the least.   Use antidiarrheal medicine (especially Imodium) several times a day to avoid getting dehydrated.  Start with a dose at bedtime & breakfast.  Adjust up or down as needed.  Increase antidiarrheal medications as directed to avoid emptying the bag more than 8 times a day (every 3 hours). Work with your wound ostomy nurse to learn care for your ostomy.  See ostomy care instructions. TROUBLESHOOTING IRREGULAR BOWELS 1) Start with a soft & bland diet. No spicy, greasy, or fried foods.  2) Avoid gluten/wheat or dairy products from diet to see if symptoms improve. 3) Miralax 17gm or flax seed mixed in 8oz. water or juice-daily. May use 2-4 times a day as needed. 4) Gas-X, Phazyme, etc. as needed for gas & bloating.  5) Prilosec (omeprazole) over-the-counter as needed 6)  Consider probiotics  (Align, Activa, etc) to help calm the bowels down  Call your doctor if you are getting worse or not getting better.  Sometimes further testing (cultures, endoscopy, X-ray studies, CT scans, bloodwork, etc.) may be needed to help diagnose and treat the cause of the diarrhea. [facility] [address], [address], [place], [place]  [address] [phone] - Main.    [phone]  - Toll Free.   [phone] - Fax [email or web address]

## 2015-12-30 NOTE — Progress Notes (Signed)
Nutrition Follow-up  DOCUMENTATION CODES:   Severe malnutrition in context of acute illness/injury  INTERVENTION:   Bolus Feed Regimen: 6 cans daily of Jevity 1.2 w/ 60 ml Prostat daily via G-tube. (0600, 0900, 1200, 1500, 1800, 2100) Please flush 30 ml H2O before and after each bolus. Free water flushes of 200 ml BID. Provides 1910 kcal (100% of needs), 109g protein and 1906 ml H2O.  If tolerates 1 can at each feeding today, may advance to 2 cans of Jevity 1.2 TID via G-tube. Flush with 60 ml H2O before and after each feeding with this regimen.  Reviewed recommendations with RN and pt's family.  RD will continue to monitor for any additional needs prior to discharge.  NUTRITION DIAGNOSIS:   Inadequate oral intake related to inability to eat as evidenced by NPO status.  Ongoing.  GOAL:   Patient will meet greater than or equal to 90% of their needs  Meeting.  MONITOR:   TF tolerance, Weight trends, Labs, Skin, I & O's  ASSESSMENT:   75 year old male presenting for a post-operative visit. Note for "Post-Operative": Patient returns one month status post robotic resection.  Distal gastrectomy with Billroth I gastroduodenal reconstruction for chronic ulcer causing obstruction.  09/25/2015  Reviewed plan with pt's RN: Stop continuous infusion of Vital AF 1.2. Will now transition to only bolus feeds of Jevity 1.2. So far pt has received 780 kcal and 48g protein via continuous infusion today. Will need 4 cans of Jevity 1.2 and 60 ml Prostat once today to meet estimated needs. Recommend continuous free water flushes of 200 ml BID once at facility. Regimen recommendations provided above. Reviewed with RN and will review with pt's wife at bedside.  Pt expected to discharge today to SNF.  Labs reviewed. Medications: OSCAL-D tablet daily, Multivitamin with minerals daily  Diet Order:  Diet NPO time specified Except for: Ice Chips Diet general  Skin:  Wound (see comment) (Abdominal  incision)  Last BM:  11/14  Height:   Ht Readings from Last 1 Encounters:  12/08/15 6\' 2"  (1.88 m)    Weight:   Wt Readings from Last 1 Encounters:  12/30/15 168 lb 3.4 oz (76.3 kg)    Ideal Body Weight:  80.9 kg  BMI:  Body mass index is 21.6 kg/m.  Estimated Nutritional Needs:   Kcal:  1900-2100   Protein:  100-115g  Fluid:  per MD/NP/Surgery  EDUCATION NEEDS:   No education needs identified at this time  Tilda FrancoLindsey Yoandri Congrove, MS, RD, LDN Pager: [phone] After Hours Pager: [phone]

## 2015-12-30 NOTE — Progress Notes (Signed)
PCCM PROGRESS NOTE  Admission date: 11/24/2015 Consult date: 12/03/2015 Referring provider: Dr. Michaell Cowing  CC: Abdominal pain  Brief description: 75 yo male former smoker adm with gastric outlet obstruction with pyloric channel ulcer.  Had Billroth 1, vagotomy, fundoplication, omentopexy August 2017 with initial improvement.  Had recurrent symptoms September 2017 that become progressively worse with gastric dilation from recurrent stricture.  Had laparoscopic placement of jejunostomy tube, gastrostomy tube, and EGD balloon dilation of duodenal stricture 10/17.  Developed gastric leakage with peritonitis after pulling out PEG tube 10/19 and taken to OR. On 10/27 found 2 new LUQ abscesses/fluid collections which were drained percutaneously by IR. Had perc trach 10/27.  Course further complicated by rectal bleeding, emboli to R fingers requiring heparin gtt.    PMHx of CAD s/p stent, GERD, HTN, HLD  Subjective: Tolerates PMV.  Still has chest congestion, but easier to cough up sputum.  Vital signs: BP (!) 136/52 (BP Location: Right Arm)   Pulse 93   Temp 97.8 F (36.6 C) (Oral)   Resp 18   Ht 6\' 2"  (1.88 m)   Wt 168 lb 3.4 oz (76.3 kg)   SpO2 94%   BMI 21.60 kg/m   Intake/output: I/O last 3 completed shifts: In: 2109.1 [I.V.:120; ZOXWR:6045; NG/GT:495.1] Out: 1400 [Urine:1400]  FiO2 (%):  [28 %] 28 %  Physical exam: General: pleasant Neuro: normal strength HEENT: trach site clean, tolerating PM valve Cardiac: regular, no murmur Chest: no wheeze/rales GI: soft, non tender Ext: no edema Skin: no rashes   CMP Latest Ref Rng & Units 12/29/2015 12/28/2015 12/27/2015  Glucose 65 - 99 mg/dL 409(W) 119(J) 478(G)  BUN 6 - 20 mg/dL 95(A) 21(H) 08(M)  Creatinine 0.61 - 1.24 mg/dL 5.78(I) 6.96(E) 9.52(W)  Sodium 135 - 145 mmol/L 153(H) 154(H) 155(H)  Potassium 3.5 - 5.1 mmol/L 3.8 3.7 3.7  Chloride 101 - 111 mmol/L 123(H) 124(H) 125(H)  CO2 22 - 32 mmol/L 23 24 24   Calcium 8.9 -  10.3 mg/dL 7.5(L) 7.3(L) 7.3(L)  Total Protein 6.5 - 8.1 g/dL - 5.8(L) -  Total Bilirubin 0.3 - 1.2 mg/dL - 0.8 -  Alkaline Phos 38 - 126 U/L - 340(H) -  AST 15 - 41 U/L - 42(H) -  ALT 17 - 63 U/L - 51 -    CBC Latest Ref Rng & Units 12/29/2015 12/21/2015 12/18/2015  WBC 4.0 - 10.5 K/uL 15.3(H) 8.9 10.6(H)  Hemoglobin 13.0 - 17.0 g/dL 7.8(L) 8.4(L) 8.6(L)  Hematocrit 39.0 - 52.0 % 24.7(L) 25.9(L) 26.0(L)  Platelets 150 - 400 K/uL 509(H) 473(H) 573(H)    Imaging: Dg Swallowing Func-speech Pathology  Result Date: 12/29/2015 Objective Swallowing Evaluation: Type of Study: MBS-Modified Barium Swallow Study Patient Details Name: EITHAN BEAGLE MRN: [id number] Date of Birth: 1940-06-22 Today's Date: 12/29/2015 Time: SLP Start Time (ACUTE ONLY): 0905-SLP Stop Time (ACUTE ONLY): 0940 SLP Time Calculation (min) (ACUTE ONLY): 35 min Past Medical History: Past Medical History: Diagnosis Date . Bilateral dry eyes  . Coronary artery disease 1997  bare mental stent right coronart artery . Degenerative disc disease   LOWER BACK . Dysphonia  . ED (erectile dysfunction)  . Gastroesophageal reflux disease  . Hyperlipidemia  . Hypertension  . Peptic ulcer  . Pneumonia   HISTORY OF IN CHILDHOOD . Posterior vitreous detachment 10/30/2012 . Urinary hesitancy  . Wears glasses  Past Surgical History: Past Surgical History: Procedure Laterality Date . CARDIAC CATHETERIZATION   . CHOLECYSTECTOMY   . ESOPHAGOGASTRODUODENOSCOPY N/A 12/01/2015  Procedure: ESOPHAGOGASTRODUODENOSCOPY (  EGD) BALLOON DILATION OF DUODENAL STRICTURE;  Surgeon: Karie SodaSteven Gross, MD;  Location: [facility];  Service: General;  Laterality: N/A; . ESOPHAGOGASTRODUODENOSCOPY N/A 12/14/2015  Procedure: ESOPHAGOGASTRODUODENOSCOPY (EGD);  Surgeon: Vida RiggerMarc Magod, MD;  [facility];  Service: Endoscopy;  Laterality: N/A;  Patient has tracheostomy . ESOPHAGOGASTRODUODENOSCOPY (EGD) WITH PROPOFOL N/A 07/01/2015  Procedure: ESOPHAGOGASTRODUODENOSCOPY (EGD) WITH  PROPOFOL;  Surgeon: Willis ModenaWilliam Outlaw, MD;  Location: [facility];  Service: Endoscopy;  Laterality: N/A; . ESOPHAGOGASTRODUODENOSCOPY (EGD) WITH PROPOFOL N/A 11/23/2015  Procedure: ESOPHAGOGASTRODUODENOSCOPY (EGD) WITH PROPOFOL;  Surgeon: Willis ModenaWilliam Outlaw, MD;  [facility];  Service: Endoscopy;  Laterality: N/A;  may need to intubate . EYE SURGERY    growth on right eye removed  . IR GENERIC HISTORICAL  12/13/2015  IR REPLC DUODEN/JEJUNO TUBE PERCUT W/FLUORO 12/13/2015 Jolaine ClickArthur Hoss, MD [facility]-INTER[facility] RAD . LAPAROSCOPIC GASTROSTOMY N/A 12/01/2015  Procedure: LAPAROSCOPIC PLACEMENT OF FEEDING JEJUNOSTOMY AND GASTROSTOMY TUBE;  Surgeon: Karie SodaSteven Gross, MD;  Location: [facility];  Service: General;  Laterality: N/A; . LAPAROSCOPY N/A 12/03/2015  Procedure: LAPAROSCOPY DIAGNOSTIC, OMENTOPEXY, JEJUNOSTOMY, WASH OUT;  Surgeon: Karie SodaSteven Gross, MD;  Location: [facility];  Service: General;  Laterality: N/A; . STENT TO HEART    2 1997 AND 1 IN 1996 . TONSILLECTOMY   . XI ROBOTIC VAGOTOMY AND ANTRECTOMY N/A 09/25/2015  Procedure: XI ROBOTIC ANTERIOR AND POSTERIOR VAGOTOMY, BILROTH I  ANASTOMOSIS DOR FUNDIPLICATION, OMENTOPEXY  UPPER ENDOSCOPY;  Surgeon: Karie SodaSteven Gross, MD;  Location: [facility];  Service: General;  Laterality: N/A; HPI: 75 yo male former smoker with progressive epigastric pain, nausea, vomiting, bloating from gastric outlet obstruction with pyloric channel ulcer.  Had Billroth 1, vagotomy, fundoplication, omentopexy August 2017 with initial improvement.  Had recurrent symptoms September 2017 that become progressively worse with gastric dilation from recurrent stricture.  Had laparoscopic placement of jejunostomy tube, gastrostomy tube, and EGD balloon dilation of duodenal stricture 10/17.  Developed gastric leakage with peritonitis after pulling out PEG tube 10/19 and taken to OR. On 10/27 found 2 new LUQ abscesses/fluid collections which were drained percutaneously by IR. Had perc trach 10/27.  As of 10/30 his mental status  has improved.  Pt underwent MBS and has been on TPN since due to level of dysphagia.  Ice chips have been consumed with improved tolerance.  MD ordered repeat MBS.   Subjective: pt awake in bed, weak Assessment / Plan / Recommendation CHL IP CLINICAL IMPRESSIONS 12/29/2015 Therapy Diagnosis Moderate oral phase dysphagia;Moderate pharyngeal phase dysphagia;Moderate cervical esophageal phase dysphagia Clinical Impression Patient continues with moderate oropharyngeal dysphagia that appears marginally worse than prior MBS 12/17/15 results.  Swallow function continues to be significantly weak resulting in residuals and aspiration with poor cough mechanism.   Pt again fatigued during MBS and at times had difficulty keeping his eyes open.   Viscous green tinged secretions coughed through trach (blew off PMSV) after oral care and before barium administration.  Pt also with copious secretions coughed AROUND trach following MBS *pt had PMSV in place.   Gross weakness secretion retention that mixed with barium without consistent awareness.  Delayed oral transiting, lingual pumping and premature spillage of barium into pharynx noted due to weakness.  Compromised tongue base retraction and laryngeal elevation allowed laryngeal penetration and SILENT aspiration both during and after the swallow.  Cues to cough/throat clear and dry swallow did not fully clear penetrates and patient quickly re-penetrated pharyngeal residuals.  In addition patient appeared with decreased proximal esophageal opening/decreased CP opening resulting in residuals in pyriform sinus.  Unfortunately  patient is at high risk of aspiration with intake at this time.  Using teach back/live video educated patient and daughter to findings and reinforced effective compensation strategies.   Recommend continue NPO except single ice chips with strict precautions - oral care, pmsv in place and cueing patient to cough/clear throat and re-swallow.  Pt also with increase  in delayed responses to SLP instructions/cues today- and states "I'm weak".  Given pt's lack of swallow improvement, SlP is concerned for swallow prognosis during acute stay.  Pt will need repeat MBS in future due to silent nature of dysphagia. Will follow up for dysphagia treatment/management.   Thanks for allowing me to help care for this patient.  Impact on safety and function Severe aspiration risk;Risk for inadequate nutrition/hydration   CHL IP TREATMENT RECOMMENDATION 12/29/2015 Treatment Recommendations F/U MBS in --- days (Comment)   Prognosis 12/29/2015 Prognosis for Safe Diet Advancement Guarded Barriers to Reach Goals Severity of deficits;Cognitive deficits Barriers/Prognosis Comment -- CHL IP DIET RECOMMENDATION 12/29/2015 SLP Diet Recommendations NPO;Ice chips PRN after oral care Liquid Administration via -- Medication Administration Via alternative means Compensations Minimize environmental distractions;Slow rate;Multiple dry swallows after each bite/sip;Hard cough after swallow Postural Changes --   CHL IP OTHER RECOMMENDATIONS 12/29/2015 Recommended Consults -- Oral Care Recommendations Oral care QID Other Recommendations --   CHL IP FOLLOW UP RECOMMENDATIONS 12/29/2015 Follow up Recommendations Skilled Nursing facility;Inpatient Rehab   CHL IP FREQUENCY AND DURATION 12/29/2015 Speech Therapy Frequency (ACUTE ONLY) min 2x/week Treatment Duration 2 weeks      CHL IP ORAL PHASE 12/29/2015 Oral Phase Impaired Oral - Pudding Teaspoon -- Oral - Pudding Cup -- Oral - Honey Teaspoon -- Oral - Honey Cup -- Oral - Nectar Teaspoon -- Oral - Nectar Cup -- Oral - Nectar Straw -- Oral - Thin Teaspoon Weak lingual manipulation;Reduced posterior propulsion;Lingual/palatal residue;Decreased bolus cohesion;Delayed oral transit;Premature spillage Oral - Thin Cup NT Oral - Thin Straw NT Oral - Puree Weak lingual manipulation;Lingual/palatal residue;Reduced posterior propulsion;Premature spillage;Decreased bolus  cohesion;Delayed oral transit Oral - Mech Soft -- Oral - Regular -- Oral - Multi-Consistency -- Oral - Pill -- Oral Phase - Comment --  CHL IP PHARYNGEAL PHASE 12/29/2015 Pharyngeal Phase Impaired Pharyngeal- Pudding Teaspoon -- Pharyngeal -- Pharyngeal- Pudding Cup -- Pharyngeal -- Pharyngeal- Honey Teaspoon -- Pharyngeal -- Pharyngeal- Honey Cup -- Pharyngeal -- Pharyngeal- Nectar Teaspoon Reduced pharyngeal peristalsis;Reduced epiglottic inversion;Reduced anterior laryngeal mobility;Reduced laryngeal elevation;Reduced airway/laryngeal closure;Reduced tongue base retraction;Penetration/Aspiration during swallow;Pharyngeal residue - valleculae;Lateral channel residue;Pharyngeal residue - cp segment Pharyngeal Material enters airway, remains ABOVE vocal cords and not ejected out Pharyngeal- Nectar Cup Delayed swallow initiation-pyriform sinuses;Reduced pharyngeal peristalsis;Reduced epiglottic inversion;Reduced anterior laryngeal mobility;Reduced laryngeal elevation;Reduced airway/laryngeal closure;Reduced tongue base retraction;Penetration/Aspiration during swallow;Penetration/Apiration after swallow;Significant aspiration (Amount);Pharyngeal residue - valleculae;Pharyngeal residue - pyriform;Lateral channel residue;Pharyngeal residue - cp segment Pharyngeal Material enters airway, passes BELOW cords without attempt by patient to eject out (silent aspiration);Material enters airway, passes BELOW cords and not ejected out despite cough attempt by patient Pharyngeal- Nectar Straw -- Pharyngeal -- Pharyngeal- Thin Teaspoon Delayed swallow initiation-vallecula;Reduced pharyngeal peristalsis;Reduced epiglottic inversion;Reduced anterior laryngeal mobility;Reduced laryngeal elevation;Reduced airway/laryngeal closure;Reduced tongue base retraction;Penetration/Aspiration during swallow;Penetration/Aspiration before swallow;Pharyngeal residue - valleculae;Lateral channel residue;Trace aspiration;Pharyngeal residue - cp  segment Pharyngeal Material enters airway, passes BELOW cords without attempt by patient to eject out (silent aspiration) Pharyngeal- Thin Cup NT Pharyngeal -- Pharyngeal- Thin Straw NT Pharyngeal -- Pharyngeal- Puree Delayed swallow initiation-vallecula;Reduced epiglottic inversion;Reduced pharyngeal peristalsis;Reduced anterior laryngeal mobility;Reduced laryngeal elevation;Reduced airway/laryngeal closure;Reduced tongue base retraction;Pharyngeal  residue - valleculae;Pharyngeal residue - pyriform Pharyngeal -- Pharyngeal- Mechanical Soft -- Pharyngeal -- Pharyngeal- Regular -- Pharyngeal -- Pharyngeal- Multi-consistency -- Pharyngeal -- Pharyngeal- Pill -- Pharyngeal -- Pharyngeal Comment pt continues with gross residuals and poor awareness, WEAK dry swallow/throat clear and cough were ineffective to fully clear aspiration/penetration/residuals, pt did have viscous colored secretions *green tinged* coughed and expectorated from trach prior to MBS and around the trach AFTER   CHL IP CERVICAL ESOPHAGEAL PHASE 12/29/2015 Cervical Esophageal Phase -- Pudding Teaspoon -- Pudding Cup -- Honey Teaspoon -- Honey Cup -- Nectar Teaspoon Prominent cricopharyngeal segment;Reduced cricopharyngeal relaxation Nectar Cup Prominent cricopharyngeal segment;Reduced cricopharyngeal relaxation Nectar Straw -- Thin Teaspoon Reduced cricopharyngeal relaxation;Prominent cricopharyngeal segment Thin Cup NT Thin Straw NT Puree Reduced cricopharyngeal relaxation;Prominent cricopharyngeal segment Mechanical Soft -- Regular -- Multi-consistency -- Pill -- Cervical Esophageal Comment -- No flowsheet data found. Donavan Burnet, MS [facility] SLP [phone]                 Studies: 10/11 CT abdomen/pelvis >> significant distension of the stomach with layering fluid and gas, NG present, worrisome for recurrent gastric outlet obstruction.  RLL consolidation, cholecycstectomy 10/26 CT abdomen/pelvis >> Moderate dilatation of proximal and mid small  bowel loops, with transition point in anterior right lower quadrant, suspicious for partial small bowel obstruction. Pneumatosis involving multiple dilated small bowel loops in the left abdomen pelvis, suspicious for bowel ischemia. No evidence of portal venous gas or free intraperitoneal air. Two left upper quadrant fluid collections adjacent to the stomach and spleen, which may represent postoperative fluid collections or abscesses. Small amount free fluid also noted in pelvis.Small bilateral pleural effusions and bibasilar atelectasis. Diffuse mesenteric and body wall edema. 10/30  EGD >> mild esophagitis, evidence of billroth procedure, gastric pouch, non-bleeding duodenal ulcers, non-bleeding jejunal ulcers, could not reach j - tube 11/02 Modified barium swallow >> silent aspiration  Events: 10/10  Admit  10/17  Lap placement of J & G tubes, EGD w/ balloon dilation of duodenal stricture, bx, closure of gastrotomy 10/19  Lap findings of gastric leak, bilious ascites, jejunal disruption, omentoplexy of jejunal disruption  10/20  R hand discoloration, aline d/c'd, VVS consulted with rec's for heparin 10/24  Extubated. Required re-intubation.    10/26  Heparin d/c'd by vascular. On pressors, albumin, lasix.  Developed rectal bleeding after starting bowel prep. 10/27  Trach placed. To IR for Perc drains to be placed in LUQ 10/31 PMSV trial, pt tolerated well 11/01  24 hours on trach collar; PMV in place  Lines: 10/19 ETT >> 10/24 then replaced 10/24 >> 10/27 10/19 G tube >> 10/19 J tube >> 10/26 Lt PICC >>  10/27 Trach placed (feinstein # 6 cuffed) >>   Assessment/plan:   Acute and chronic respiratory failure 2nd to peritonitis. Failure to wean s/p tracheostomy. - tracheostomy collar O2 supplementation to maintain O2 > 90% - PMSV in place when supervised - Change to #6 cuffless trach 11/10 - have scheduled him for follow up with Anders Simmonds in tracheostomy clinic on 01/20/16 at 1  pm   Coralyn Helling, MD [facility] Pulmonary/Critical Care 12/30/2015, 9:43 AM Pager:  [phone] After 3pm call: [phone]

## 2015-12-30 NOTE — Discharge Summary (Addendum)
Physician Discharge Summary  Patient ID: [name] MRN: [id number] DOB/AGE: [birth year] 75 y.o.  Admit date: 11/24/2015 Discharge date: 12/30/2015  Patient Care Team: Lavone Orn, MD as PCP - General (Internal Medicine) Michael Boston, MD as Consulting Physician (General Surgery) Arta Silence, MD as Consulting Physician (Gastroenterology)  Admission Diagnoses: Principal Problem:   Bile peritonitis due to gastric tube dislodgement Active Problems:   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 09/25/2015   Pneumatosis of intestines   Essential hypertension, benign   Hyperlipidemia   Anxiety state   Gastric outlet obstruction   Gastric Ileus    Encephalopathy acute   Protein-calorie malnutrition, severe   Acute respiratory failure with hypoxemia (HCC)   Tobacco abuse   Ischemic right index finger at tip   Gastritis   Gastric tube present (LUQ)   Jejunostomy tube present (LLQ)   Tracheostomy in place [facility])   Hypernatremia   Pressure injury of skin   Discharge Diagnoses:  Principal Problem:   Bile peritonitis due to gastric tube dislodgement Active Problems:   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 09/25/2015   Pneumatosis of intestines   Essential hypertension, benign   Hyperlipidemia   Anxiety state   Gastric outlet obstruction   Gastric Ileus    Encephalopathy acute   Protein-calorie malnutrition, severe   Acute respiratory failure with hypoxemia (HCC)   Tobacco abuse   Ischemic right index finger at tip   Gastritis   Gastric tube present (LUQ)   Jejunostomy tube present (LLQ)   Tracheostomy in place [facility])   Hypernatremia  SURGERY 12/03/2015  POST-OPERATIVE DIAGNOSIS:   Peritonitis Jejunal disruption Gastric leak  PROCEDURE:   LAPAROSCOPY DIAGNOSTIC OMENTOPEXY of jejunal disruption [place] OUT x 21L with drains placement  SURGEON: Michael Boston, MD   Post-Op 12/01/2015  POST-OPERATIVE  DIAGNOSIS:   RECURRENT GASTRIC OUTLET OBSTRUCTION DUE to significant edema at gastroduodenal anastomosis. GASTRITIS Failure to thrive with malnutrition  PROCEDURE:   LAPAROSCOPIC PLACEMENT OF FEEDING JEJUNOSTOMY AND GASTROSTOMY TUBEs ESOPHAGOGASTRODUODENOSCOPY (EGD) BALLOON DILATION OF DUODENAL STRICTURE Gastric biopsies  Closure of gastrotomy  SURGEON: Michael Boston, MD  Prior surgery 09/25/2015  POST-OPERATIVE DIAGNOSIS: Partial gastric outlet obstruction due to chronic pyloric stricture  PROCEDURE:  XI ROBOTIC distal gastrectomy BILROTH I ANASTOMOSIS  ANTERIOR AND POSTERIOR VAGOTOMY DOR (Anterior 270 degree) FUNDIPLICATION OMENTOPEXY UPPER ENDOSCOPY  SURGEON: Michael Boston, MD  POST-OPERATIVE DIAGNOSIS:   duodenal bulb ulceration  SURGERY:  0/30/2017  Procedure(s): ESOPHAGOGASTRODUODENOSCOPY (EGD)  SURGEON:    Surgeon(s): Clarene Essex, MD  Consults: pulmonary/intensive care, GI and PT, OT, Speech Therapy  Hospital Course:   The patient is status post distal gastrectomy and vagotomy for prepyloric ulcer and stricture in August.  He developed inflammation and recurrent gastric outlet obstruction.  Got to the point he cannot keep anything down.  Had EGD endoscopy.  GI did not feel safe to do any intervention at this time.  Patient was admitted.  Nasogastric tube placed.  Placed on intravenous proton pump inhibitors.  Placed on intravenous parenteral TNA nutrition   He did no open up.  Underwent esophagogastroduodenoscopy with balloon dilatation of Billroth I gastroduodenal anastomosis by myself.  Lap assisted gastrostomy and jejunostomy tube placement.  Postoperatively the patient became agitated & confused intermittently at night.  He pulled out his PICC line & Gastrstomy and tube the night of postoperative day two.  Gastrostomy tube replaced in the middle of the night.  Jejunostomy tube still in place.  PICC line replaced.  Patient began to get more  uncomfortable.  Taken to the operating room the next day.  Underwent laparoscopic washout.  Had omentopexy of jejunal tear near jejunostomy site.  Had upsized gastrostomy tube placed & more agressively secured.  Kept in intensive care unit for over a week.  Was on pressors and vent support.  Eventually weaned off pressors.  Transitioned to a tracheostomy.  Had CT scan of abdomen and pelvis done a week later.  Fluid collections found and percutaneously drained in LUQ abdomen x 2.  Consistent with serous ascites.  No abscess.  Eventually all surgical and interventional percutaneous drains were removed.  Completed course of intravenous antibiotics.  Did not need to be resumed.  He did develop loss of signals on his right hand due to a right brachial artery thrombosis at his emergency right brachial arterial a line needed with the emergency surgery earlier in his admission.  He was placed on full anticoagulation.  Vascular surgery was consulted.  Repeat duplex noted clots resolved.  He did have purple discoloration and purpura of the finger pad of his right index digit.  That remain stable and improved.  Vascular surgery felt it was safe to come all Foley anticoagulation.  He was placed on low-dose anoxic parent during his admission.  He had mild swelling in his left upper extremity had his PICC line side but no definite clot there.  PICC line will be removed at the time of discharge.  Eventually patient was weaned off the ventilator to trach collar.  Remained off pressors.  He did struggle with intermittent confusion.  He pulled out his jejunostomy tube.  This was replaced and interventional radiology.  Struggled with leaking around it.  He pulled it out again a week later.  I replaced at the bedside.  He was kept on intervention nutrition at this time since he did not seem to be able to tolerate tube feeds well   CT scan was concerning for partial small bowel obstruction, jejunal thickening, and pneumatosis.     However hemodynamically improved and was stable off antibiotics.  Clinically he improved.    Abdominal pain resolved.Ileus resolved.  Had repeat endoscopy that revealed  the Bilroth one gastroduodenal anastomosis was more open and could allow the endoscopy to pass into normal duodenum.  He was placed on Carafate and continuous proton pump inhibitors.  Occasional H2 blockers as well as there was a Producer, television/film/video of proton pump inhibitors.    Patient struggling with a lot of agitation and confusion.  Had been on Precedex and weaned off all benzodiazepines during the intensive care unit stay.  He did have two episodes where he pulled out his jejunostomy tube that required replacement by interventional radiology one weekend and then by myself five days later.  He was more aggressively secured.  He struggled with a lot of leaking around the gastrostomy and jejunostomy tubes the first few weeks.  Eventually we are able to adjust his pain and sedation regimen.  Haldol seem to work best for him.  We transited rash and off continuous to as needed.  Transitioned just mainly at bedtime.  That seemed to work well.  His chronic low back pain seen to be controlled with Tylenol and tramadol intraorally.  Occasionally used IV fentanyl.  I used a beta blocker for hypertension and potential anxiolysis.  That seemed to work better.  He weaned down to a low dose a day.  We restarted a jejunal tube feeds.  We did  not tolerate them.  Gastrostomy tube output went down.  Tolerated transition to gravity.  Tolerated gastrostomy tube clamping.  Transitioned to floor.  Tracheostomy downsized.  Patient rather weak and deconditioned.  Worked with physical and occupational therapy.  Near the end of hospitalization was able to stand up and do some walking in the hallways.  However was still considered and aspiration risk.  Was able to transition from continuous LLQ 16Fr red robinson jejunostomy tube feedings to bolus gastrostomy tube  feedings.  Patient was able to have medications given enterally.  He did struggle with anasarca and fluid overload.  Had diuresis.  It have rising his BUN/creatinine implying he was a little too dry.  Hypernatremia.  That seemed to stabilize improve with the help of free water bolus returns through his gastrostomy tube.  By the time of discharge, he was tolerating his medications through his left upper quadrant Foley balloon 28 French gastrostomy tube, he was tolerating medications through there.  He was less confused and more interactive.  He was tolerating a cuffless tracheostomy with Passy-Muir valve.  Secretions much less.  He was tolerating his jejunostomy tube clamped.  He was tolerating intermittent feeding and medication through his gastrostomy tube.  Leak at around this tube was much less.  He had no peritoneal surgical or percutaneous drains.  He did not have a Foley catheter.  He was not requiring IV medications.  He is able to walk with significant assist.  It was felt at this point it was safe to transition to a skilled facility with rehabilitation potential so he can undergo physical and occupational therapy to help improve his deconditioning.  Have speech therapy continued to evaluate and hopefully be able to get him back on an oral diet.  Considered de-cannulating his tracheostomy in the next week or so if he continues to get stronger.  I will follow him closely in clinic.  Hopefully eventually as his gastroduodenal anastomosis is opened up, we will be able to let him transition and remove his jejunostomy and gastrostomy tubes in the next month or so as he gets stronger and as he regains swallowing capacity.   Postoperative recommendations were discussed in detail.  They are written as well.   Significant Diagnostic Studies:  Results for orders placed or performed during the hospital encounter of 11/24/15 (from the past 72 hour(s))  Comprehensive metabolic panel     Status: Abnormal    Collection Time: 12/28/15  4:19 AM  Result Value Ref Range   Sodium 154 (H) 135 - 145 mmol/L   Potassium 3.7 3.5 - 5.1 mmol/L   Chloride 124 (H) 101 - 111 mmol/L   CO2 24 22 - 32 mmol/L   Glucose, Bld 132 (H) 65 - 99 mg/dL   BUN 69 (H) 6 - 20 mg/dL   Creatinine, Ser 1.25 (H) 0.61 - 1.24 mg/dL   Calcium 7.3 (L) 8.9 - 10.3 mg/dL   Total Protein 5.8 (L) 6.5 - 8.1 g/dL   Albumin 1.6 (L) 3.5 - 5.0 g/dL   AST 42 (H) 15 - 41 U/L   ALT 51 17 - 63 U/L   Alkaline Phosphatase 340 (H) 38 - 126 U/L   Total Bilirubin 0.8 0.3 - 1.2 mg/dL   GFR calc non Af Amer 54 (L) >60 mL/min   GFR calc Af Amer >60 >60 mL/min    Comment: (NOTE) The eGFR has been calculated using the CKD EPI equation. This calculation has not been validated in all clinical  situations. eGFR's persistently <60 mL/min signify possible Chronic Kidney Disease.    Anion gap 6 5 - 15  Prealbumin     Status: Abnormal   Collection Time: 12/28/15  4:19 AM  Result Value Ref Range   Prealbumin 15.3 (L) 18 - 38 mg/dL    Comment: Performed at [facility]  Magnesium     Status: None   Collection Time: 12/28/15  4:19 AM  Result Value Ref Range   Magnesium 2.1 1.7 - 2.4 mg/dL  Phosphorus     Status: None   Collection Time: 12/28/15  4:19 AM  Result Value Ref Range   Phosphorus 2.8 2.5 - 4.6 mg/dL  Basic metabolic panel     Status: Abnormal   Collection Time: 12/29/15  9:00 AM  Result Value Ref Range   Sodium 153 (H) 135 - 145 mmol/L   Potassium 3.8 3.5 - 5.1 mmol/L   Chloride 123 (H) 101 - 111 mmol/L   CO2 23 22 - 32 mmol/L   Glucose, Bld 117 (H) 65 - 99 mg/dL   BUN 63 (H) 6 - 20 mg/dL   Creatinine, Ser 1.44 (H) 0.61 - 1.24 mg/dL   Calcium 7.5 (L) 8.9 - 10.3 mg/dL   GFR calc non Af Amer 46 (L) >60 mL/min   GFR calc Af Amer 53 (L) >60 mL/min    Comment: (NOTE) The eGFR has been calculated using the CKD EPI equation. This calculation has not been validated in all clinical situations. eGFR's persistently <60 mL/min  signify possible Chronic Kidney Disease.    Anion gap 7 5 - 15  CBC     Status: Abnormal   Collection Time: 12/29/15  9:00 AM  Result Value Ref Range   WBC 15.3 (H) 4.0 - 10.5 K/uL   RBC 2.75 (L) 4.22 - 5.81 MIL/uL   Hemoglobin 7.8 (L) 13.0 - 17.0 g/dL   HCT 24.7 (L) 39.0 - 52.0 %   MCV 89.8 78.0 - 100.0 fL   MCH 28.4 26.0 - 34.0 pg   MCHC 31.6 30.0 - 36.0 g/dL   RDW 16.3 (H) 11.5 - 15.5 %   Platelets 509 (H) 150 - 400 K/uL    Dg Swallowing Func-speech Pathology  Result Date: 12/29/2015 Objective Swallowing Evaluation: Type of Study: MBS-Modified Barium Swallow Study Patient Details Name: [name] MRN: [id number] Date of Birth: November 25, 1940 Today's Date: 12/29/2015 Time: SLP Start Time (ACUTE ONLY): 0905-SLP Stop Time (ACUTE ONLY): 0940 SLP Time Calculation (min) (ACUTE ONLY): 35 min Past Medical History: Past Medical History: Diagnosis Date . Bilateral dry eyes  . Coronary artery disease 1997  bare mental stent right coronart artery . Degenerative disc disease   LOWER BACK . Dysphonia  . ED (erectile dysfunction)  . Gastroesophageal reflux disease  . Hyperlipidemia  . Hypertension  . Peptic ulcer  . Pneumonia   HISTORY OF IN CHILDHOOD . Posterior vitreous detachment 10/30/2012 . Urinary hesitancy  . Wears glasses  Past Surgical History: Past Surgical History: Procedure Laterality Date . CARDIAC CATHETERIZATION   . CHOLECYSTECTOMY   . ESOPHAGOGASTRODUODENOSCOPY N/A 12/01/2015  Procedure: ESOPHAGOGASTRODUODENOSCOPY (EGD) BALLOON DILATION OF DUODENAL STRICTURE;  Surgeon: Michael Boston, MD;  Location: [facility];  Service: General;  Laterality: N/A; . ESOPHAGOGASTRODUODENOSCOPY N/A 12/14/2015  Procedure: ESOPHAGOGASTRODUODENOSCOPY (EGD);  Surgeon: Clarene Essex, MD;  Location: [facility];  Service: Endoscopy;  Laterality: N/A;  Patient has tracheostomy . ESOPHAGOGASTRODUODENOSCOPY (EGD) WITH PROPOFOL N/A 07/01/2015  Procedure: ESOPHAGOGASTRODUODENOSCOPY (EGD) WITH PROPOFOL;  Surgeon: Arta Silence,  MD;  Location: [facility];  Service: Endoscopy;  Laterality: N/A; . ESOPHAGOGASTRODUODENOSCOPY (EGD) WITH PROPOFOL N/A 11/23/2015  Procedure: ESOPHAGOGASTRODUODENOSCOPY (EGD) WITH PROPOFOL;  Surgeon: Arta Silence, MD;  Location: [facility];  Service: Endoscopy;  Laterality: N/A;  may need to intubate . EYE SURGERY    growth on right eye removed  . IR GENERIC HISTORICAL  12/13/2015  IR REPLC DUODEN/JEJUNO TUBE PERCUT W/FLUORO 12/13/2015 Marybelle Killings, MD [facility]-INTERV RAD . LAPAROSCOPIC GASTROSTOMY N/A 12/01/2015  Procedure: LAPAROSCOPIC PLACEMENT OF FEEDING JEJUNOSTOMY AND GASTROSTOMY TUBE;  Surgeon: Michael Boston, MD;  Location: [facility];  Service: General;  Laterality: N/A; . LAPAROSCOPY N/A 12/03/2015  Procedure: LAPAROSCOPY DIAGNOSTIC, OMENTOPEXY, JEJUNOSTOMY, [place] OUT;  Surgeon: Michael Boston, MD;  Location: [facility];  Service: General;  Laterality: N/A; . STENT TO HEART    2 1997 AND 1 IN 1996 . TONSILLECTOMY   . XI ROBOTIC VAGOTOMY AND ANTRECTOMY N/A 09/25/2015  Procedure: XI ROBOTIC ANTERIOR AND POSTERIOR VAGOTOMY, BILROTH I  ANASTOMOSIS DOR FUNDIPLICATION, OMENTOPEXY  UPPER ENDOSCOPY;  Surgeon: Michael Boston, MD;  Location: [facility];  Service: General;  Laterality: N/A; HPI: 75 yo male former smoker with progressive epigastric pain, nausea, vomiting, bloating from gastric outlet obstruction with pyloric channel ulcer.  Had Billroth 1, vagotomy, fundoplication, omentopexy August 2017 with initial improvement.  Had recurrent symptoms September 2017 that become progressively worse with gastric dilation from recurrent stricture.  Had laparoscopic placement of jejunostomy tube, gastrostomy tube, and EGD balloon dilation of duodenal stricture 10/17.  Developed gastric leakage with peritonitis after pulling out PEG tube 10/19 and taken to OR. On 10/27 found 2 new LUQ abscesses/fluid collections which were drained percutaneously by IR. Had perc trach 10/27.  As of 10/30 his mental status has improved.  Pt underwent MBS and  has been on TPN since due to level of dysphagia.  Ice chips have been consumed with improved tolerance.  MD ordered repeat MBS.   Subjective: pt awake in bed, weak Assessment / Plan / Recommendation CHL IP CLINICAL IMPRESSIONS 12/29/2015 Therapy Diagnosis Moderate oral phase dysphagia;Moderate pharyngeal phase dysphagia;Moderate cervical esophageal phase dysphagia Clinical Impression Patient continues with moderate oropharyngeal dysphagia that appears marginally worse than prior MBS 12/17/15 results.  Swallow function continues to be significantly weak resulting in residuals and aspiration with poor cough mechanism.   Pt again fatigued during MBS and at times had difficulty keeping his eyes open.   Viscous green tinged secretions coughed through trach (blew off PMSV) after oral care and before barium administration.  Pt also with copious secretions coughed AROUND trach following MBS *pt had PMSV in place.   Meylin Stenzel weakness secretion retention that mixed with barium without consistent awareness.  Delayed oral transiting, lingual pumping and premature spillage of barium into pharynx noted due to weakness.  Compromised tongue base retraction and laryngeal elevation allowed laryngeal penetration and SILENT aspiration both during and after the swallow.  Cues to cough/throat clear and dry swallow did not fully clear penetrates and patient quickly re-penetrated pharyngeal residuals.  In addition patient appeared with decreased proximal esophageal opening/decreased CP opening resulting in residuals in pyriform sinus.  Unfortunately patient is at high risk of aspiration with intake at this time.  Using teach back/live video educated patient and daughter to findings and reinforced effective compensation strategies.   Recommend continue NPO except single ice chips with strict precautions - oral care, pmsv in place and cueing patient to cough/clear throat and re-swallow.  Pt also with increase in delayed responses to SLP  instructions/cues today- and  states "I'm weak".  Given pt's lack of swallow improvement, SlP is concerned for swallow prognosis during acute stay.  Pt will need repeat MBS in future due to silent nature of dysphagia. Will follow up for dysphagia treatment/management.   Thanks for allowing me to help care for this patient.  Impact on safety and function Severe aspiration risk;Risk for inadequate nutrition/hydration   CHL IP TREATMENT RECOMMENDATION 12/29/2015 Treatment Recommendations F/U MBS in --- days (Comment)   Prognosis 12/29/2015 Prognosis for Safe Diet Advancement Guarded Barriers to Reach Goals Severity of deficits;Cognitive deficits Barriers/Prognosis Comment -- CHL IP DIET RECOMMENDATION 12/29/2015 SLP Diet Recommendations NPO;Ice chips PRN after oral care Liquid Administration via -- Medication Administration Via alternative means Compensations Minimize environmental distractions;Slow rate;Multiple dry swallows after each bite/sip;Hard cough after swallow Postural Changes --   CHL IP OTHER RECOMMENDATIONS 12/29/2015 Recommended Consults -- Oral Care Recommendations Oral care QID Other Recommendations --   CHL IP FOLLOW UP RECOMMENDATIONS 12/29/2015 Follow up Recommendations Skilled Nursing facility;Inpatient Rehab   CHL IP FREQUENCY AND DURATION 12/29/2015 Speech Therapy Frequency (ACUTE ONLY) min 2x/week Treatment Duration 2 weeks      CHL IP ORAL PHASE 12/29/2015 Oral Phase Impaired Oral - Pudding Teaspoon -- Oral - Pudding Cup -- Oral - Honey Teaspoon -- Oral - Honey Cup -- Oral - Nectar Teaspoon -- Oral - Nectar Cup -- Oral - Nectar Straw -- Oral - Thin Teaspoon Weak lingual manipulation;Reduced posterior propulsion;Lingual/palatal residue;Decreased bolus cohesion;Delayed oral transit;Premature spillage Oral - Thin Cup NT Oral - Thin Straw NT Oral - Puree Weak lingual manipulation;Lingual/palatal residue;Reduced posterior propulsion;Premature spillage;Decreased bolus cohesion;Delayed oral transit Oral  - Mech Soft -- Oral - Regular -- Oral - Multi-Consistency -- Oral - Pill -- Oral Phase - Comment --  CHL IP PHARYNGEAL PHASE 12/29/2015 Pharyngeal Phase Impaired Pharyngeal- Pudding Teaspoon -- Pharyngeal -- Pharyngeal- Pudding Cup -- Pharyngeal -- Pharyngeal- Honey Teaspoon -- Pharyngeal -- Pharyngeal- Honey Cup -- Pharyngeal -- Pharyngeal- Nectar Teaspoon Reduced pharyngeal peristalsis;Reduced epiglottic inversion;Reduced anterior laryngeal mobility;Reduced laryngeal elevation;Reduced airway/laryngeal closure;Reduced tongue base retraction;Penetration/Aspiration during swallow;Pharyngeal residue - valleculae;Lateral channel residue;Pharyngeal residue - cp segment Pharyngeal Material enters airway, remains ABOVE vocal cords and not ejected out Pharyngeal- Nectar Cup Delayed swallow initiation-pyriform sinuses;Reduced pharyngeal peristalsis;Reduced epiglottic inversion;Reduced anterior laryngeal mobility;Reduced laryngeal elevation;Reduced airway/laryngeal closure;Reduced tongue base retraction;Penetration/Aspiration during swallow;Penetration/Apiration after swallow;Significant aspiration (Amount);Pharyngeal residue - valleculae;Pharyngeal residue - pyriform;Lateral channel residue;Pharyngeal residue - cp segment Pharyngeal Material enters airway, passes BELOW cords without attempt by patient to eject out (silent aspiration);Material enters airway, passes BELOW cords and not ejected out despite cough attempt by patient Pharyngeal- Nectar Straw -- Pharyngeal -- Pharyngeal- Thin Teaspoon Delayed swallow initiation-vallecula;Reduced pharyngeal peristalsis;Reduced epiglottic inversion;Reduced anterior laryngeal mobility;Reduced laryngeal elevation;Reduced airway/laryngeal closure;Reduced tongue base retraction;Penetration/Aspiration during swallow;Penetration/Aspiration before swallow;Pharyngeal residue - valleculae;Lateral channel residue;Trace aspiration;Pharyngeal residue - cp segment Pharyngeal Material enters  airway, passes BELOW cords without attempt by patient to eject out (silent aspiration) Pharyngeal- Thin Cup NT Pharyngeal -- Pharyngeal- Thin Straw NT Pharyngeal -- Pharyngeal- Puree Delayed swallow initiation-vallecula;Reduced epiglottic inversion;Reduced pharyngeal peristalsis;Reduced anterior laryngeal mobility;Reduced laryngeal elevation;Reduced airway/laryngeal closure;Reduced tongue base retraction;Pharyngeal residue - valleculae;Pharyngeal residue - pyriform Pharyngeal -- Pharyngeal- Mechanical Soft -- Pharyngeal -- Pharyngeal- Regular -- Pharyngeal -- Pharyngeal- Multi-consistency -- Pharyngeal -- Pharyngeal- Pill -- Pharyngeal -- Pharyngeal Comment pt continues with Kourtnee Lahey residuals and poor awareness, WEAK dry swallow/throat clear and cough were ineffective to fully clear aspiration/penetration/residuals, pt did have viscous colored secretions *green tinged* coughed and expectorated from trach prior to MBS and  around the trach AFTER   CHL IP CERVICAL ESOPHAGEAL PHASE 12/29/2015 Cervical Esophageal Phase -- Pudding Teaspoon -- Pudding Cup -- Honey Teaspoon -- Honey Cup -- Nectar Teaspoon Prominent cricopharyngeal segment;Reduced cricopharyngeal relaxation Nectar Cup Prominent cricopharyngeal segment;Reduced cricopharyngeal relaxation Nectar Straw -- Thin Teaspoon Reduced cricopharyngeal relaxation;Prominent cricopharyngeal segment Thin Cup NT Thin Straw NT Puree Reduced cricopharyngeal relaxation;Prominent cricopharyngeal segment Mechanical Soft -- Regular -- Multi-consistency -- Pill -- Cervical Esophageal Comment -- No flowsheet data found. Luanna Salk, [facility] SLP [phone]               Discharge Exam: Blood pressure (!) 122/52, pulse (!) 101, temperature 97.8 F (36.6 C), temperature source Oral, resp. rate 18, height _0  (1.88 m), weight 76.3 kg (168 lb 3.4 oz), SpO2 94 %.  Bowel function:             Flatus: YES             BM:  YES             Gtube: Leaking around skin less minimal  mucus Jtube site: clean  Physical Exam:  General: Pt awakens in NAD. Less confused.  Tired.  No acute distress.  Interacting with staff Eyes: PERRL, normal EOM.  Sclera clear.  No icterus Neuro: CN II-XII intact w/o focal sensory/motor deficits. Lymph: No head/neck/groin lymphadenopathy Psych:  No delerium/psychosis/paranoia.   HENT: Normocephalic, Mucus membranes moist.  No thrush.  Trach cuffless with PMV in place.  Less secretions Neck: Supple, No tracheal deviation Chest: Chest wall pain w good excursion.  No wheezing. CV:  Pulses intact.  Regular rhythm MS: Normal AROM mjr joints.  No obvious deformity.  LBP persists  Abdomen:    G tube (28Fr Foley LUQ).  Rash around skin smaller with less drainage   J tube (16Fr Red robinson) in LLQ - skin irritated but stable   Edema left flank.    Softer.  Nondistended.  Nontender.  No guarding  GU:  NEMG Ext:  SCDs BLE.  No mjr edema.  Right index finger pad with purple demarcation but + sensation.  Nail bed intact   PICC LUE clean with mild edema Skin: No more anasarca.  No other petechiae / purpura  Discharged Condition: poor   Past Medical History:  Diagnosis Date  . Bilateral dry eyes   . Coronary artery disease 1997   bare mental stent right coronart artery  . Degenerative disc disease    LOWER BACK  . Dysphonia   . ED (erectile dysfunction)   . Gastroesophageal reflux disease   . Hyperlipidemia   . Hypertension   . Peptic ulcer   . Pneumonia    HISTORY OF IN CHILDHOOD  . Posterior vitreous detachment 10/30/2012  . Urinary hesitancy   . Wears glasses     Past Surgical History:  Procedure Laterality Date  . CARDIAC CATHETERIZATION    . CHOLECYSTECTOMY    . ESOPHAGOGASTRODUODENOSCOPY N/A 12/01/2015   Procedure: ESOPHAGOGASTRODUODENOSCOPY (EGD) BALLOON DILATION OF DUODENAL STRICTURE;  Surgeon: Michael Boston, MD;  Location: [facility];  Service: General;  Laterality: N/A;  . ESOPHAGOGASTRODUODENOSCOPY N/A 12/14/2015    Procedure: ESOPHAGOGASTRODUODENOSCOPY (EGD);  Surgeon: Clarene Essex, MD;  Location: [facility];  Service: Endoscopy;  Laterality: N/A;  Patient has tracheostomy  . ESOPHAGOGASTRODUODENOSCOPY (EGD) WITH PROPOFOL N/A 07/01/2015   Procedure: ESOPHAGOGASTRODUODENOSCOPY (EGD) WITH PROPOFOL;  Surgeon: Arta Silence, MD;  Location: [facility];  Service: Endoscopy;  Laterality: N/A;  . ESOPHAGOGASTRODUODENOSCOPY (EGD) WITH PROPOFOL N/A  11/23/2015   Procedure: ESOPHAGOGASTRODUODENOSCOPY (EGD) WITH PROPOFOL;  Surgeon: Arta Silence, MD;  Location: [facility] ENDOSCOPY;  Service: Endoscopy;  Laterality: N/A;  may need to intubate  . EYE SURGERY     growth on right eye removed   . IR GENERIC HISTORICAL  12/13/2015   IR REPLC DUODEN/JEJUNO TUBE PERCUT W/FLUORO 12/13/2015 Marybelle Killings, MD [facility]  . LAPAROSCOPIC GASTROSTOMY N/A 12/01/2015   Procedure: LAPAROSCOPIC PLACEMENT OF FEEDING JEJUNOSTOMY AND GASTROSTOMY TUBE;  Surgeon: Michael Boston, MD;  Location: [facility];  Service: General;  Laterality: N/A;  . LAPAROSCOPY N/A 12/03/2015   Procedure: LAPAROSCOPY DIAGNOSTIC, OMENTOPEXY, JEJUNOSTOMY, [place] OUT;  Surgeon: Michael Boston, MD;  Location: [facility];  Service: General;  Laterality: N/A;  . STENT TO HEART     2 1997 AND 1 IN 1996  . TONSILLECTOMY    . XI ROBOTIC VAGOTOMY AND ANTRECTOMY N/A 09/25/2015   Procedure: XI ROBOTIC ANTERIOR AND POSTERIOR VAGOTOMY, BILROTH I  ANASTOMOSIS DOR FUNDIPLICATION, OMENTOPEXY  UPPER ENDOSCOPY;  Surgeon: Michael Boston, MD;  Location: [facility];  Service: General;  Laterality: N/A;    Social History   Social History  . Marital status: Married    Spouse name: N/A  . Number of children: N/A  . Years of education: N/A   Occupational History  . Not on file.   Social History Main Topics  . Smoking status: Former Smoker    Packs/day: 2.00    Years: 20.00    Types: Cigarettes    Quit date: 02/15/1995  . Smokeless tobacco: Never Used  . Alcohol use No  . Drug use: No  . Sexual  activity: Not on file   Other Topics Concern  . Not on file   Social History Narrative  . No narrative on file    Family History  Problem Relation Age of Onset  . Heart attack Mother     Current Facility-Administered Medications  Medication Dose Route Frequency Provider Last Rate Last Dose  . 0.9 %  sodium chloride infusion  250 mL Intravenous PRN Michael Boston, MD      . acetaminophen (TYLENOL) tablet 1,000 mg  1,000 mg Per Tube TID Michael Boston, MD   1,000 mg at 12/29/15 2222  . alum & mag hydroxide-simeth (MAALOX/MYLANTA) 200-200-20 MG/5ML suspension 30 mL  30 mL Oral Q6H PRN Michael Boston, MD      . aspirin EC tablet 81 mg  81 mg Oral QPM Michael Boston, MD      . atorvastatin (LIPITOR) tablet 40 mg  40 mg Oral QHS Michael Boston, MD      . bisacodyl (DULCOLAX) suppository 10 mg  10 mg Rectal Daily PRN Michael Boston, MD      . bismuth subsalicylate (PEPTO BISMOL) 262 MG/15ML suspension 30 mL  30 mL Oral Q8H PRN Michael Boston, MD      . calcium-vitamin D (OSCAL WITH D) 500-200 MG-UNIT per tablet 1 tablet  1 tablet Oral Q breakfast Michael Boston, MD      . chlorhexidine gluconate (MEDLINE KIT) (PERIDEX) 0.12 % solution 15 mL  15 mL Mouth Rinse BID Michael Boston, MD   15 mL at 12/29/15 2022  . cycloSPORINE (RESTASIS) 0.05 % ophthalmic emulsion 1 drop  1 drop Both Eyes BID Michael Boston, MD   1 drop at 12/29/15 2221  . feeding supplement (JEVITY 1.2 CAL) liquid 474 mL  474 mL Per Tube TID Michael Boston, MD      . feeding supplement (PRO-STAT SUGAR FREE 64)  liquid 30 mL  30 mL Per Tube Daily Michael Boston, MD   30 mL at 12/29/15 1158  . feeding supplement (VITAL AF 1.2 CAL) liquid 1,000 mL  1,000 mL Per Tube Q24H Michael Boston, MD 65 mL/hr at 12/29/15 2223 1,000 mL at 12/29/15 2223  . fentaNYL (SUBLIMAZE) injection 12.5-25 mcg  12.5-25 mcg Intravenous Q4H PRN Michael Boston, MD   25 mcg at 12/30/15 9622  . free water 300 mL  300 mL Per Tube Q8H Michael Boston, MD   300 mL at 12/30/15 0539  . haloperidol  (HALDOL) tablet 1 mg  1 mg Oral QHS PRN,MR X 1 Michael Boston, MD   1 mg at 12/30/15 0124  . haloperidol lactate (HALDOL) injection 1 mg  1 mg Intravenous Q8H PRN Michael Boston, MD   1 mg at 12/24/15 0514  . hydrALAZINE (APRESOLINE) injection 5 mg  5 mg Intravenous Q4H PRN Marshell Garfinkel, MD   5 mg at 12/20/15 1918  . lactated ringers bolus 1,000 mL  1,000 mL Intravenous Q8H PRN Michael Boston, MD      . lip balm (CARMEX) ointment   Topical PRN Michael Boston, MD      . liver oil-zinc oxide (DESITIN) 40 % ointment   Topical BID Michael Boston, MD   1 application at 29/79/89 2224  . loperamide (IMODIUM) 1 MG/5ML solution 2 mg  2 mg Per Tube PRN Michael Boston, MD      . magic mouthwash  15 mL Oral QID PRN Michael Boston, MD      . menthol-cetylpyridinium (CEPACOL) lozenge 3 mg  1 lozenge Oral PRN Michael Boston, MD      . metoCLOPramide (REGLAN) injection 5-10 mg  5-10 mg Intravenous Q6H PRN Michael Boston, MD      . metoprolol (LOPRESSOR) injection 5 mg  5 mg Intravenous Q6H PRN Michael Boston, MD   5 mg at 12/16/15 1300  . metoprolol tartrate (LOPRESSOR) tablet 12.5 mg  12.5 mg Per Tube Daily Michael Boston, MD   12.5 mg at 12/29/15 1158  . multivitamins with iron tablet 1 tablet  1 tablet Oral Daily Michael Boston, MD      . nicotine (NICODERM CQ - dosed in mg/24 hr) patch 7 mg  7 mg Transdermal Daily Chesley Mires, MD   7 mg at 12/29/15 1155  . nitroGLYCERIN (NITROSTAT) SL tablet 0.4 mg  0.4 mg Sublingual Q5 min PRN Michael Boston, MD      . nystatin (MYCOSTATIN/NYSTOP) topical powder   Topical TID Michael Boston, MD   1 Bottle at 12/29/15 2224  . ondansetron (ZOFRAN) injection 4 mg  4 mg Intravenous Q6H PRN Jerrye Beavers, PA-C   4 mg at 12/13/15 1811  . pantoprazole sodium (PROTONIX) 40 mg/20 mL oral suspension 40 mg  40 mg Per Tube BID Michael Boston, MD   40 mg at 12/29/15 2222  . phenol (CHLORASEPTIC) mouth spray 2 spray  2 spray Mouth/Throat PRN Michael Boston, MD      . polyethylene glycol (MIRALAX / GLYCOLAX) packet  17 g  17 g Per Tube Q12H PRN Michael Boston, MD      . polyvinyl alcohol (LIQUIFILM TEARS) 1.4 % ophthalmic solution 2 drop  2 drop Both Eyes BID Michael Boston, MD   2 drop at 12/29/15 2225  . sodium chloride flush (NS) 0.9 % injection 10-40 mL  10-40 mL Intracatheter Q12H Michael Boston, MD   10 mL at 12/23/15 1148  . sodium chloride flush (NS) 0.9 %  injection 10-40 mL  10-40 mL Intracatheter PRN Michael Boston, MD   20 mL at 12/28/15 1809  . sodium chloride flush (NS) 0.9 % injection 3 mL  3 mL Intravenous Q12H Michael Boston, MD      . sodium chloride flush (NS) 0.9 % injection 3 mL  3 mL Intravenous PRN Michael Boston, MD      . traMADol Veatrice Bourbon) tablet 50-100 mg  50-100 mg Per Tube Q6H PRN Michael Boston, MD   100 mg at 12/30/15 0533  . Vitamin B-12 SUBL 1,000 mcg  1 tablet Sublingual Q breakfast Michael Boston, MD         Allergies  Allergen Reactions  . Flomax [Tamsulosin Hcl] Other (See Comments)    Leg weakness  . Lisinopril Cough  . Lorazepam     "i don't like it"  Prefers Xanax or valium  . Uroxatral [Alfuzosin Hcl Er] Other (See Comments)    Leg weakness    Disposition: 01-Home or Self Care  Discharge Instructions    Call MD for:    Complete by:  As directed    Temperature > 101.63F   Call MD for:  extreme fatigue    Complete by:  As directed    Call MD for:  hives    Complete by:  As directed    Call MD for:  persistant nausea and vomiting    Complete by:  As directed    Call MD for:  redness, tenderness, or signs of infection (pain, swelling, redness, odor or green/yellow discharge around incision site)    Complete by:  As directed    Call MD for:  severe uncontrolled pain    Complete by:  As directed    Diet general    Complete by:  As directed    Ice chips/sips of water Continue Speech Therapy eval for swallowing as strength improves   Discharge instructions    Complete by:  As directed    Please see discharge instruction sheets.   Also refer to any handouts/printouts that  may have been given from the [facility] surgery office (if you visited [place] there before surgery) Please call our office if you have any questions or concerns [phone]   Discharge wound care:    Complete by:  As directed    If you have closed incisions: Shower and bathe over these incisions with soap and water every day.  It is OK to wash over the dressings: they are waterproof. Remove all surgical dressings on postoperative day #3.  You do not need to replace dressings over the closed incisions unless you feel more comfortable with a Band-Aid covering it.   If you have an open wound: That requires packing, so please see wound care instructions.   In general, remove all dressings, wash wound with soap and water and then replace with saline moistened gauze.  Do the dressing change at least every day.    Please call our office [phone] if you have further questions.   Driving Restrictions    Complete by:  As directed    No driving until off narcotics and can safely swerve away without pain during an emergency   Increase activity slowly    Complete by:  As directed    Lifting restrictions    Complete by:  As directed    Avoid heavy lifting initially, <20 pounds at first.   Do not push through pain.   You have no specific weight limit: If it hurts  to do, DON'T DO IT.    If you feel no pain, you are not injuring anything.  Pain will protect you from injury.   Coughing and sneezing are far more stressful to your incision than any lifting.   Avoid resuming heavy lifting (>50 pounds) or other intense activity until off all narcotic pain medications.   When want to exercise more, give yourself 2 weeks to gradually get back to full intense exercise/activity.   May shower / Bathe    Complete by:  As directed    [place].  It is fine for dressings or wounds to be washed/rinsed.  Use gentle soap & water.  This will help the incisions and/or wounds get clean & minimize infection.   May walk  up steps    Complete by:  As directed    Sexual Activity Restrictions    Complete by:  As directed    Sexual activity as tolerated.  Do not push through pain.  Pain will protect you from injury.   Walk with assistance    Complete by:  As directed    Walk over an hour a day.  May use a [name]/cane/companion to help with balance and stamina.       Medication List    STOP taking these medications   valsartan 80 MG tablet Commonly known as:  DIOVAN     TAKE these medications   alum & mag hydroxide-simeth 200-200-20 MG/5ML suspension Commonly known as:  MAALOX/MYLANTA Take 30 mLs by mouth every 6 (six) hours as needed for indigestion or heartburn (or bloating).   aspirin EC 81 MG tablet Take 81 mg by mouth every evening.   atorvastatin 40 MG tablet Commonly known as:  LIPITOR Take 40 mg by mouth at bedtime.   bisacodyl 10 MG suppository Commonly known as:  DULCOLAX Place 1 suppository (10 mg total) rectally daily as needed for moderate constipation or severe constipation.   bismuth subsalicylate 382 NK/53ZJ suspension Commonly known as:  PEPTO BISMOL Take 30 mLs by mouth every 8 (eight) hours as needed for indigestion or diarrhea or loose stools.   calcium-vitamin D 500-200 MG-UNIT tablet Commonly known as:  OSCAL WITH D Take 1 tablet by mouth daily with breakfast.   feeding supplement (JEVITY 1.2 CAL) Liqd Place 474 mLs into feeding tube 3 (three) times daily.   feeding supplement (PRO-STAT SUGAR FREE 64) Liqd Place 30 mLs into feeding tube daily.   free water Soln Place 300 mLs into feeding tube every 8 (eight) hours.   haloperidol 1 MG tablet Commonly known as:  HALDOL Take 1 tablet (1 mg total) by mouth at bedtime as needed and may repeat dose one time if needed for agitation (insomnia).   liver oil-zinc oxide 40 % ointment Commonly known as:  DESITIN Apply topically 2 (two) times daily.   loperamide 1 MG/5ML solution Commonly known as:  IMODIUM Place 10  mLs (2 mg total) into feeding tube as needed for diarrhea or loose stools (76m per G tube q8h PRN more than 2BMs q8H).   magic mouthwash Soln Take 15 mLs by mouth 4 (four) times daily as needed for mouth pain (sore throat).   metoCLOPramide 5 MG tablet Commonly known as:  REGLAN Take 1-2 tablets (5-10 mg total) by mouth every 6 (six) hours as needed for nausea or vomiting.   metoprolol tartrate 25 MG tablet Commonly known as:  LOPRESSOR Place 0.5 tablets (12.5 mg total) into feeding tube daily.   multivitamin  tablet Take 2 tablets by mouth daily with breakfast.   nitroGLYCERIN 0.4 MG SL tablet Commonly known as:  NITROSTAT Place 0.4 mg under the tongue every 5 (five) minutes as needed for chest pain.   pantoprazole 40 MG tablet Commonly known as:  PROTONIX Take 40 mg by mouth 2 (two) times daily.   REFRESH OP Place 2 drops into both eyes as needed (For dry eyes.).   RESTASIS 0.05 % ophthalmic emulsion Generic drug:  cycloSPORINE Place 1 drop into both eyes 2 (two) times daily.   traMADol 50 MG tablet Commonly known as:  ULTRAM Take 1-2 tablets (50-100 mg total) by mouth every 6 (six) hours as needed for moderate pain or severe pain.   Vitamin B-12 1000 MCG Subl Place 1 tablet under the tongue daily with breakfast.         Signed: Morton Peters, M.D., F.A.C.S. Gastrointestinal and Minimally Invasive Surgery [facility] [address] [place], [place] [address] [phone] Main / Paging   12/30/2015, 7:16 AM

## 2015-12-30 NOTE — Progress Notes (Signed)
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  On 10/19 had to go back to the OR after he removed his G tube accidentally leading to peritonitis, intubation, septic shock. Rt index finger ischemia likely from Rt brachial a line. Traccheostomy 12/11/15. Trial  off of vent /on Trach collar 10/30 until trip to endo then back on vent..     OT comments  Pt needed increased encouragement- but did self feed  Follow Up Recommendations  SNF    Equipment Recommendations  3 in 1 bedside comode       Precautions / Restrictions Precautions Precautions: Fall Precaution Comments:  J tube, G tube, PEG. multiple lines, trach collar 6 lts 28% very weak legs Restrictions Weight Bearing Restrictions: No       Mobility Bed Mobility            NA      Transfers          NA                ADL Overall ADL's : Needs assistance/impaired Eating/Feeding: Minimal assistance Eating/Feeding Details (indicate cue type and reason): ice- encouraged pt to reach for cup, hold it and scoop ice with spoon. Pt overall min A- set up. Pt needed encouragement but did well                                                    Cognition   Behavior During Therapy: WFL for tasks assessed/performed Overall Cognitive Status: Within Functional Limits for tasks assessed                           Pertinent Vitals/ Pain       Faces Pain Scale: Hurts little more Pain Location: hips Pain Descriptors / Indicators: Discomfort Pain Intervention(s): Premedicated before session;Monitored during session;Limited activity within patient's tolerance  Home Living                                               Frequency  Min 2X/week        Progress Toward Goals  OT Goals(current goals can now be found in the care plan section)  Progress towards OT goals: Progressing toward goals     Plan Discharge plan remains appropriate          Activity Tolerance Patient tolerated treatment well   Patient Left with call bell/phone within reach;with family/visitor present;in bed;with nursing/sitter in room   Nurse Communication          Time: [id number] OT Time Calculation (min): 17 min  Charges: OT General Charges $OT Visit: 1 Procedure OT Treatments $Self Care/Home Management : 8-22 mins  [name], [place] KungLorraine D 12/30/2015, 12:23 PM

## 2015-12-30 NOTE — Clinical Social Work Placement (Signed)
Medical Social Worker facilitated patient discharge including contacting patient family and facility to confirm patient discharge plans.  Clinical information faxed to facility and family agreeable with plan.  MSW arranged ambulance transport via [facility] to [facility].  RN to call report prior to discharge.  Medical Social Worker will sign off for now as social work intervention is no longer needed. Please consult us again if new need arises.  CLINICAL SOCIAL WORK PLACEMENT  NOTE  Date:  12/30/2015  Patient Details  Name: [name] MRN: [id number] Date of Birth: March 27, 1940  Clinical Social Work is seeking post-discharge placement for this patient at the Skilled  Nursing Facility level of care (*CSW will initial, date and re-position this form in  chart as items are completed):  Yes   Patient/family provided with Secretary Clinical Social Work Department's list of facilities offering this level of care within the geographic area requested by the patient (or if unable, by the patient's family).  Yes   Patient/family informed of their freedom to choose among providers that offer the needed level of care, that participate in Medicare, Medicaid or managed care program needed by the patient, have an available bed and are willing to accept the patient.  Yes   Patient/family informed of [place]'s ownership interest in [facility] and [facility], as well as of the fact that they are under no obligation to receive care at these facilities.  PASRR submitted to EDS on 12/23/15     PASRR number received on 12/23/15     Existing PASRR number confirmed on       FL2 transmitted to all facilities in geographic area requested by pt/family on 12/23/15     FL2 transmitted to all facilities within larger geographic area on       Patient informed that his/her managed care company has contracts with or will negotiate with certain facilities, including the following:         Yes   Patient/family informed of bed offers received.  Patient chooses bed at  [facility] )     Physician recommends and patient chooses bed at      Patient to be transferred to  [facility] ) on 12/30/15.  Patient to be transferred to facility by  [facility] )     Patient family notified on 12/30/15 of transfer.  Name of family member notified:   (Pt's wife, [name] and dtr, [name] )     PHYSICIAN Please sign FL2, Please prepare priority discharge summary, including medications     Additional Comment:    _______________________________________________ Derenda FennelNixon, Yaneli Keithley A 12/30/2015, 10:52 AM

## 2015-12-31 ENCOUNTER — Non-Acute Institutional Stay (SKILLED_NURSING_FACILITY): Payer: Medicare Other | Admitting: Internal Medicine

## 2015-12-31 ENCOUNTER — Encounter: Payer: Self-pay | Admitting: Internal Medicine

## 2015-12-31 DIAGNOSIS — H5203 Hypermetropia, bilateral: Secondary | ICD-10-CM | POA: Insufficient documentation

## 2015-12-31 DIAGNOSIS — J9601 Acute respiratory failure with hypoxia: Secondary | ICD-10-CM | POA: Diagnosis not present

## 2015-12-31 DIAGNOSIS — N400 Enlarged prostate without lower urinary tract symptoms: Secondary | ICD-10-CM | POA: Insufficient documentation

## 2015-12-31 DIAGNOSIS — I998 Other disorder of circulatory system: Secondary | ICD-10-CM | POA: Diagnosis not present

## 2015-12-31 DIAGNOSIS — K311 Adult hypertrophic pyloric stenosis: Secondary | ICD-10-CM

## 2015-12-31 DIAGNOSIS — K6389 Other specified diseases of intestine: Secondary | ICD-10-CM | POA: Diagnosis not present

## 2015-12-31 DIAGNOSIS — H52203 Unspecified astigmatism, bilateral: Secondary | ICD-10-CM

## 2015-12-31 DIAGNOSIS — Z931 Gastrostomy status: Secondary | ICD-10-CM

## 2015-12-31 DIAGNOSIS — Z978 Presence of other specified devices: Secondary | ICD-10-CM

## 2015-12-31 DIAGNOSIS — K913 Postprocedural intestinal obstruction, unspecified as to partial versus complete: Secondary | ICD-10-CM | POA: Diagnosis not present

## 2015-12-31 DIAGNOSIS — Z930 Tracheostomy status: Secondary | ICD-10-CM | POA: Diagnosis not present

## 2015-12-31 DIAGNOSIS — Z934 Other artificial openings of gastrointestinal tract status: Secondary | ICD-10-CM | POA: Diagnosis not present

## 2015-12-31 DIAGNOSIS — I25118 Atherosclerotic heart disease of native coronary artery with other forms of angina pectoris: Secondary | ICD-10-CM

## 2015-12-31 DIAGNOSIS — H524 Presbyopia: Secondary | ICD-10-CM

## 2015-12-31 DIAGNOSIS — I742 Embolism and thrombosis of arteries of the upper extremities: Secondary | ICD-10-CM | POA: Diagnosis not present

## 2015-12-31 DIAGNOSIS — K567 Ileus, unspecified: Secondary | ICD-10-CM

## 2015-12-31 DIAGNOSIS — G9340 Encephalopathy, unspecified: Secondary | ICD-10-CM | POA: Diagnosis not present

## 2015-12-31 DIAGNOSIS — K2900 Acute gastritis without bleeding: Secondary | ICD-10-CM

## 2015-12-31 DIAGNOSIS — E785 Hyperlipidemia, unspecified: Secondary | ICD-10-CM

## 2015-12-31 DIAGNOSIS — Z9911 Dependence on respirator [ventilator] status: Secondary | ICD-10-CM

## 2015-12-31 DIAGNOSIS — H11021 Central pterygium of right eye: Secondary | ICD-10-CM | POA: Insufficient documentation

## 2015-12-31 DIAGNOSIS — Z720 Tobacco use: Secondary | ICD-10-CM

## 2015-12-31 DIAGNOSIS — H18452 Nodular corneal degeneration, left eye: Secondary | ICD-10-CM | POA: Insufficient documentation

## 2015-12-31 DIAGNOSIS — H43819 Vitreous degeneration, unspecified eye: Secondary | ICD-10-CM | POA: Insufficient documentation

## 2015-12-31 DIAGNOSIS — E538 Deficiency of other specified B group vitamins: Secondary | ICD-10-CM

## 2015-12-31 DIAGNOSIS — K9189 Other postprocedural complications and disorders of digestive system: Secondary | ICD-10-CM

## 2015-12-31 DIAGNOSIS — E870 Hyperosmolality and hypernatremia: Secondary | ICD-10-CM

## 2015-12-31 NOTE — Progress Notes (Signed)
: Provider:  Randon GoldsmithAnne D. Lyn HollingsheadAlexander, MD Location:  [facility] Room Number: [phone] Place of Service:  SNF ([phone])  PCP: Lillia MountainGRIFFIN,JOHN JOSEPH, MD Patient Care Team: Kirby FunkJohn Griffin, MD as PCP - General (Internal Medicine) Karie SodaSteven Gross, MD as Consulting Physician (General Surgery) Willis ModenaWilliam Outlaw, MD as Consulting Physician (Gastroenterology)  Extended Emergency Contact Information Primary Emergency Contact: Antony ContrasEdwards,Emma Jean Address: [address]          [place], [place] [address] [place] Home Phone: [phone] Mobile Phone: [phone] Relation: Spouse Secondary Emergency Contact: Alan RipperBlackman,[name]  [place] Mobile Phone: [phone] Relation: Daughter     Allergies: Flomax [tamsulosin hcl]; Lisinopril; Lorazepam; and Uroxatral [alfuzosin hcl er]  Chief Complaint  Patient presents with  . New Admit To SNF    Admit to Facility    HPI: Patient is 75 y.o. male with BPH, CAD, who is status post distal gastrectomy and vagotomy for prepyloric ulcer and stricture in August.  He developed inflammation and recurrent gastric outlet obstruction to the point he could not  keep anything down. He underwent  EGD endoscopy but GI did not feel safe to do any intervention at that time.  Patient [facility]11/15  where a nasogastric tube was placed and pt was placed  on intravenous proton pump inhibitors and  intravenous parenteral TNA nutrition. The stricture did not open up and pt underwent esophagogastroduodenoscopy with balloon dilatation of Billroth I gastroduodenal anastomosis and gastrostomy and jejunostomy tube were placed. Post op pt became agitated and confused and uncomfortable and pt was taken to OR again for laproscopic washout and jejunal tear near J site was repaired. Post -op second surgery pt developed acute respiratory failure and was placed in ICU for vent support and pressors. He was transitioned to a  tracheostomy. A week later CT of abd and pelvis revealed fluid collections which were percutaneously drained. Eventually all surgical and interventional percutaneous drains were removed and pt completed course of intravenous antibiotics.  Hospital course was further complicated by a R brachial thrombosis 2/2 R brachial art line which resolved with anticoagulation.  Other complications of hospital course involved abdominal pain and an ileus, jejunal thickening and pneumatosis, which resolved and a series of problems with tubes coming out, being pulled out, leakage and feeding problems with J tube all of which resolved. On d/c to SNF pt was being bolused through G tube but was having diarrhea after each feed.   Throughout hospitalization pt had a lot of agitation and confusion. Benzos were weaned off and betablockers and haldol worked better as anxiolytics, which is confirmed by family.  Pt is admitted to SNF for OT/PT and ST, with goal of regaining swallowing capacity and decanulating tracheostomy.    Past Medical History:  Diagnosis Date  . Bilateral dry eyes   . BPH (benign prostatic hyperplasia)   . Central pterygium of right eye   . Coronary artery disease 1997, 2009   bare mental stent right coronart artery, occlusive followed by Dr. Garnette ScheuermannHank Smith in [place]  . Degenerative disc disease    LOWER BACK  . Dysphonia   . ED (erectile dysfunction)   . Gastroesophageal reflux disease   . Hyperlipidemia   . Hyperopia of both eyes with astigmatism and presbyopia   . Hypertension   . Nuclear senile cataract    Dr. Bernadette [facility]itHayden [facility] in [place]  . Peptic ulcer   . Pneumonia    HISTORY OF  IN CHILDHOOD  . Posterior vitreous detachment 10/30/2012  . Salzmann's nodular dystrophy of left eye   . Urinary hesitancy   . Wears glasses     Past Surgical History:  Procedure Laterality Date  . CARDIAC CATHETERIZATION    . CHOLECYSTECTOMY    . ESOPHAGOGASTRODUODENOSCOPY N/A 12/01/2015     Procedure: ESOPHAGOGASTRODUODENOSCOPY (EGD) BALLOON DILATION OF DUODENAL STRICTURE;  Surgeon: Karie SodaSteven Gross, MD;  Location: [facility];  Service: General;  Laterality: N/A;  . ESOPHAGOGASTRODUODENOSCOPY N/A 12/14/2015   Procedure: ESOPHAGOGASTRODUODENOSCOPY (EGD);  Surgeon: Vida RiggerMarc Magod, MD;  [facility];  Service: Endoscopy;  Laterality: N/A;  Patient has tracheostomy  . ESOPHAGOGASTRODUODENOSCOPY (EGD) WITH PROPOFOL N/A 07/01/2015   Procedure: ESOPHAGOGASTRODUODENOSCOPY (EGD) WITH PROPOFOL;  Surgeon: Willis ModenaWilliam Outlaw, MD;  Location: [facility];  Service: Endoscopy;  Laterality: N/A;  . ESOPHAGOGASTRODUODENOSCOPY (EGD) WITH PROPOFOL N/A 11/23/2015   Procedure: ESOPHAGOGASTRODUODENOSCOPY (EGD) WITH PROPOFOL;  Surgeon: Willis ModenaWilliam Outlaw, MD;  [facility];  Service: Endoscopy;  Laterality: N/A;  may need to intubate  . EYE SURGERY     growth on right eye removed   . IR GENERIC HISTORICAL  12/13/2015   IR REPLC DUODEN/JEJUNO TUBE PERCUT W/FLUORO 12/13/2015 Jolaine ClickArthur Hoss, MD [facility]-INTERV RAD  . LAPAROSCOPIC GASTROSTOMY N/A 12/01/2015   Procedure: LAPAROSCOPIC PLACEMENT OF FEEDING JEJUNOSTOMY AND GASTROSTOMY TUBE;  Surgeon: Karie SodaSteven Gross, MD;  Location: [facility];  Service: General;  Laterality: N/A;  . LAPAROSCOPY N/A 12/03/2015   Procedure: LAPAROSCOPY DIAGNOSTIC, OMENTOPEXY, JEJUNOSTOMY, WASH OUT;  Surgeon: Karie SodaSteven Gross, MD;  Location: [facility];  Service: General;  Laterality: N/A;  . STENT TO HEART     2 1997 AND 1 IN 1996  . TONSILLECTOMY    . XI ROBOTIC VAGOTOMY AND ANTRECTOMY N/A 09/25/2015   Procedure: XI ROBOTIC ANTERIOR AND POSTERIOR VAGOTOMY, BILROTH I  ANASTOMOSIS DOR FUNDIPLICATION, OMENTOPEXY  UPPER ENDOSCOPY;  Surgeon: Karie SodaSteven Gross, MD;  Location: [facility];  Service: General;  Laterality: N/A;      Medication List       Accurate as of 12/31/15  9:52 AM. Always use your most recent med list.          alum & mag hydroxide-simeth 200-200-20 MG/5ML suspension Commonly known as:   MAALOX/MYLANTA Take 30 mLs by mouth every 6 (six) hours as needed for indigestion or heartburn (or bloating).   aspirin EC 81 MG tablet Take 81 mg by mouth every evening.   atorvastatin 40 MG tablet Commonly known as:  LIPITOR Take 40 mg by mouth at bedtime.   bisacodyl 10 MG suppository Commonly known as:  DULCOLAX Place 1 suppository (10 mg total) rectally daily as needed for moderate constipation or severe constipation.   bismuth subsalicylate 262 MG/15ML suspension Commonly known as:  PEPTO BISMOL Take 30 mLs by mouth every 8 (eight) hours as needed for indigestion or diarrhea or loose stools.   calcium-vitamin D 500-200 MG-UNIT tablet Commonly known as:  OSCAL WITH D Take 1 tablet by mouth daily with breakfast.   feeding supplement (JEVITY 1.2 CAL) Liqd Place 474 mLs into feeding tube 3 (three) times daily.   feeding supplement (PRO-STAT SUGAR FREE 64) Liqd Place 30 mLs into feeding tube daily.   free water Soln Place 300 mLs into feeding tube every 8 (eight) hours.   haloperidol 1 MG tablet Commonly known as:  HALDOL Take 1 tablet (1 mg total) by mouth at bedtime as needed and may repeat dose one time if needed for agitation (insomnia).   liver oil-zinc oxide 40 %  ointment Commonly known as:  DESITIN Apply topically 2 (two) times daily.   loperamide 1 MG/5ML solution Commonly known as:  IMODIUM Place 10 mLs (2 mg total) into feeding tube as needed for diarrhea or loose stools (2mg  per G tube q8h PRN more than 2BMs q8H).   magic mouthwash Soln Take 15 mLs by mouth 4 (four) times daily as needed for mouth pain (sore throat).   metoCLOPramide 5 MG tablet Commonly known as:  REGLAN Take 1-2 tablets (5-10 mg total) by mouth every 6 (six) hours as needed for nausea or vomiting.   metoprolol tartrate 25 MG tablet Commonly known as:  LOPRESSOR Place 0.5 tablets (12.5 mg total) into feeding tube daily.   multivitamin tablet Take 2 tablets by mouth daily with  breakfast.   nitroGLYCERIN 0.4 MG SL tablet Commonly known as:  NITROSTAT Place 0.4 mg under the tongue every 5 (five) minutes as needed for chest pain.   pantoprazole 40 MG tablet Commonly known as:  PROTONIX Take 40 mg by mouth 2 (two) times daily.   REFRESH OP Place 2 drops into both eyes as needed (For dry eyes.).   RESTASIS 0.05 % ophthalmic emulsion Generic drug:  cycloSPORINE Place 1 drop into both eyes 2 (two) times daily.   traMADol 50 MG tablet Commonly known as:  ULTRAM Take 1-2 tablets (50-100 mg total) by mouth every 6 (six) hours as needed for moderate pain or severe pain.   Vitamin B-12 1000 MCG Subl Place 1 tablet under the tongue daily with breakfast.       No orders of the defined types were placed in this encounter.    There is no immunization history on file for this patient.  Social History  Substance Use Topics  . Smoking status: Former Smoker    Packs/day: 2.00    Years: 20.00    Types: Cigarettes    Quit date: 02/15/1995  . Smokeless tobacco: Never Used  . Alcohol use No    Family history is   Family History  Problem Relation Age of Onset  . Heart attack Mother       Review of Systems  DATA OBTAINED: from patient, nurse, medical record, wife and daughter GENERAL:  no fevers,+ fatigue, appetite changes SKIN: No itching, or rash EYES: No eye pain, redness, discharge EARS: No earache, tinnitus, change in hearing NOSE: No congestion, drainage or bleeding  MOUTH/THROAT: No mouth or tooth pain, No sore throat RESPIRATORY: + cough, tresolving, no wheezing,no SOB CARDIAC: No chest pain, palpitations, lower extremity edema  GI: No abdominal pain, No N/V/D or constipation, No heartburn or reflux  GU: No dysuria, frequency or urgency, or incontinence  MUSCULOSKELETAL: No unrelieved bone/joint pain NEUROLOGIC: No headache, dizziness or focal weakness PSYCHIATRIC: No c/o anxiety or sadness   Vitals:   12/31/15 0920  BP: (!) 154/56    Pulse: 100  Resp: (!) 22  Temp: 97 F (36.1 C)    SpO2 Readings from Last 1 Encounters:  12/31/15 95%   Body mass index is 22.73 kg/m.     Physical Exam  GENERAL APPEARANCE: Alert, conversant,  No acute distress.  SKIN: No diaphoresis rash HEAD: Normocephalic, atraumatic  EYES: Conjunctiva/lids clear. Pupils round, reactive. EOMs intact.  EARS: External exam WNL, canals clear. Hearing grossly normal.  NOSE: No deformity or discharge.  MOUTH/THROAT: Lips w/o lesions; trach  RESPIRATORY: Breathing is even, unlabored. Lung sounds are mild rhonchi, O2 sat RA 95%  CARDIOVASCULAR: Heart RRR no murmurs, rubs or  gallops. No peripheral edema.   GASTROINTESTINAL: Abdomen is soft, non-tender, not distended w/ normal bowel sounds; maceration around former J tube site and around current G tube site GENITOURINARY: Bladder non tender, not distended  MUSCULOSKELETAL: No abnormal joints or musculature, thin NEUROLOGIC:  Cranial nerves 2-12 grossly intact. Moves all extremities  PSYCHIATRIC: Mood and affect appropriate to situation, no behavioral issues  Patient Active Problem List   Diagnosis Date Noted  . BPH (benign prostatic hyperplasia)   . Posterior vitreous detachment   . Salzmann's nodular dystrophy of left eye   . Hyperopia of both eyes with astigmatism and presbyopia   . Central pterygium of right eye   . Pressure injury of skin 12/30/2015  . Hypernatremia 12/28/2015  . Tracheostomy in place [facility]) 12/25/2015  . Pneumatosis of intestines 12/11/2015  . Tobacco abuse 12/09/2015  . Ischemic right index finger at tip 12/09/2015  . Gastritis 12/09/2015  . Gastric tube present (LUQ) 12/09/2015  . Jejunostomy tube present (LLQ) 12/09/2015  . Acute respiratory failure with hypoxemia (HCC)   . Protein-calorie malnutrition, severe 12/04/2015  . Encephalopathy acute 12/03/2015  . Bile peritonitis due to gastric tube dislodgement 12/03/2015  . Gastric Ileus  11/25/2015  . Gastric  outlet obstruction 11/24/2015  . Anxiety state 09/28/2015  . Hyperlipidemia   . 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 Coronary atherosclerosis of native coronary artery 04/24/2013  . Essential hypertension, benign 04/24/2013  . Other and unspecified hyperlipidemia 04/24/2013  . Central pterygium 10/30/2012  . 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 Cataract, nuclear sclerotic senile 10/30/2012      Labs reviewed: Basic Metabolic Panel:    Component Value Date/Time   NA 153 (H) 12/29/2015 0900   NA 153 (A) 12/29/2015   K 3.8 12/29/2015 0900   CL 123 (H) 12/29/2015 0900   CO2 23 12/29/2015 0900   GLUCOSE 117 (H) 12/29/2015 0900   BUN 63 (H) 12/29/2015 0900   BUN 63 (A) 12/29/2015   CREATININE 1.44 (H) 12/29/2015 0900   CALCIUM 7.5 (L) 12/29/2015 0900   PROT 5.8 (L) 12/28/2015 0419   ALBUMIN 1.6 (L) 12/28/2015 0419   AST 42 (H) 12/28/2015 0419   ALT 51 12/28/2015 0419   ALKPHOS 340 (H) 12/28/2015 0419   BILITOT 0.8 12/28/2015 0419   GFRNONAA 46 (L) 12/29/2015 0900   GFRAA 53 (L) 12/29/2015 0900     Recent Labs  12/21/15 0530  12/24/15 0435  12/27/15 0348 12/28/15 12/28/15 0419 12/29/15 12/29/15 0900  NA 147*  < > 152*  < > 155* 154* 154* 153* 153*  K 3.5  < > 3.8  < > 3.7 3.7 3.7  --  3.8  CL 115*  < > 121*  < > 125*  --  124*  --  123*  CO2 24  < > 23  < > 24  --  24  --  23  GLUCOSE 118*  < > 140*  < > 133*  --  132*  --  117*  BUN 40*  < > 58*  < > 79* 69* 69* 63* 63*  CREATININE 1.06  < > 1.19  < > 1.33* 1.3 1.25* 1.4* 1.44*  CALCIUM 8.4*  < > 8.0*  < > 7.3*  --  7.3*  --  7.5*  MG 2.0  --  2.2  --   --   --  2.1  --   --   PHOS  4.3  --  3.4  --   --   --  2.8  --   --   < > = values in this interval not displayed. Liver Function Tests:  Recent Labs  12/21/15 0530 12/24/15 0435 12/28/15 0419  AST 31 43* 42*  ALT 28 44 51  ALKPHOS 212* 305* 340*  BILITOT 1.4* 1.1 0.8    PROT 6.1* 6.2* 5.8*  ALBUMIN 1.6* 1.6* 1.6*    Recent Labs  12/04/15 0930  LIPASE 10*   No results for input(s): AMMONIA in the last 8760 hours. CBC:  Recent Labs  12/14/15 0549 12/15/15 0424 12/18/15 1159 12/21/15 0530 12/29/15 12/29/15 0900  WBC 7.7 10.2 10.6* 8.9 15.3 15.3*  NEUTROABS 5.1 7.0  --  6.1  --   --   HGB 10.3* 8.8* 8.6* 8.4*  --  7.8*  HCT 31.0* 26.4* 26.0* 25.9*  --  24.7*  MCV 88.8 89.2 87.5 89.0  --  89.8  PLT 516* 633* 573* 473*  --  509*   Lipid  Recent Labs  12/10/15 0418 12/14/15 0549 12/21/15 0530  TRIG 214* 194* 199*    Cardiac Enzymes: No results for input(s): CKTOTAL, CKMB, CKMBINDEX, TROPONINI in the last 8760 hours. BNP: No results for input(s): BNP in the last 8760 hours. No results found for: MICROALBUR No results found for: HGBA1C No results found for: TSH No results found for: VITAMINB12 No results found for: FOLATE No results found for: IRON, TIBC, FERRITIN  Imaging and Procedures obtained prior to SNF admission: Ct Abdomen Pelvis W Contrast  Result Date: 11/25/2015 CLINICAL DATA:  Partial gastrectomy 2 months ago. 50 pound weight loss. History of gastric outlet obstruction. Rule out leak or abscess. EXAM: CT ABDOMEN AND PELVIS WITH CONTRAST TECHNIQUE: Multidetector CT imaging of the abdomen and pelvis was performed using the standard protocol following bolus administration of intravenous contrast. CONTRAST:  ISOVUE-300 IOPAMIDOL (ISOVUE-300) INJECTION 61% COMPARISON:  None. FINDINGS: Lower chest: Patchy opacity posteriorly in the right lower lung, new since prior study. Cannot exclude area of early infiltrate. Left lung base is clear. Heart is normal size. Hepatobiliary: Prior cholecystectomy.  No focal hepatic abnormality. Pancreas: No focal abnormality or ductal dilatation. Spleen: No focal abnormality.  Normal size. Adrenals/Urinary Tract: No adrenal abnormality. No focal renal abnormality. No stones or hydronephrosis.  Urinary bladder is unremarkable. Stomach/Bowel: There is distention of the stomach with layering fluid and gas. NG tube tip is in the proximal to mid stomach. Postsurgical changes in the region of the distal stomach. Small bowel is decompressed. Scattered left colonic diverticula. No active diverticulitis. Vascular/Lymphatic: Diffuse aortoiliac atherosclerosis with calcifications. No aneurysm. No adenopathy. Reproductive: No visible focal abnormality. Other: No free fluid or free air. No focal fluid collection to suggest abscess. Musculoskeletal: No acute bony abnormality or focal bone lesion. IMPRESSION: Significant distention of the stomach with layering fluid and gas. NG tube is present in the proximal to mid stomach. Appearance concerning for Recurrent gastric outlet obstruction. Patchy new opacity noted posteriorly and right lower lobe. Cannot exclude early pneumonia. Prior cholecystectomy. Scattered left colonic diverticula.  No active diverticulitis. Aortoiliac atherosclerosis. These results will be called to the ordering clinician or representative by the Radiologist Assistant, and communication documented in the PACS or zVision Dashboard. Electronically Signed   By: Charlett Nose M.D.   On: 11/25/2015 16:05     Not all labs, radiology exams or other studies done during hospitalization come through on my EPIC note; however they are  reviewed by me.    Assessment and Plan  GASTRIC OUTLET OBSTRUCTION 2/2 STRICTURE JEJUNAL TEAR REQUIRING SURGERY/ACUTE RESPIRATORY /J TUBE/ G TUBE- admitted for managment of stricture at gastroduodenal anastomosis with IV proton pump inh and TPN, requiring balloon dilitation and placement of  J tube and G tube; second surgery 2/2 jejunal tear after which pt continued to be unable to be extubated and was tx in ICU for a week with pressors. Fluid collections on CT abd  Were drained percutaneously and IV antibiotic course was finished. Pt was trached and weaned to O2. J tube  feedings failed but Gtube feeding s successful 2 days prior to d/c to SNF but with diarrhea after each feed SNF - J tube fell out this am. There were no sutures and skin is macerated about the site; maceration about G tube site as well; wound care has already started treatment ; discussed feeding 1 can jevity  q4 instead of 2 cans q8 to see if can lessen diarrhea some; cont 40 mg protonix BID  R BRACHIAL ARTERY THROMBOSIS - with isxhemic tip R index finger;treated with anticoagualtion and resolved; some purple to fingers remaining   ILEUS VS PARTIAL SBO/ PNEUMATOSIS- resolved  ENCEPHALOPATHY- pt was weaned of benzos in ICU and anxiety and agitation responded well to bblockers ans haldol SNF - cont iue evening Haldol and adding Haldol 0.25 mg at 4pm for sundowning at request of family  HYPERNATREMIA -from tube feeds and water imbalances SNF p will monitor BMP  CAD SNF - cont ASA 81 mg daily and metoprolol 12.5 mg daily; NTG SL prn, statin  VIT B12 DEF SNF - cont 1000 I sublingual daily  TOBACCO ABUSE SNF - cont nicotine patch  HLD  SNF - cont lipitor 40 mg daily    TIME SPENT > 60 MIN;> 50% of time with patient was spent reviewing records, labs, tests and studies, counseling and developing plan of care  Thurston Hole D. [name], MD

## 2016-01-02 DIAGNOSIS — E538 Deficiency of other specified B group vitamins: Secondary | ICD-10-CM | POA: Insufficient documentation

## 2016-01-02 DIAGNOSIS — I742 Embolism and thrombosis of arteries of the upper extremities: Secondary | ICD-10-CM | POA: Insufficient documentation

## 2016-01-06 ENCOUNTER — Non-Acute Institutional Stay (SKILLED_NURSING_FACILITY): Payer: Medicare Other | Admitting: Internal Medicine

## 2016-01-06 DIAGNOSIS — E870 Hyperosmolality and hypernatremia: Secondary | ICD-10-CM | POA: Diagnosis not present

## 2016-01-06 DIAGNOSIS — R093 Abnormal sputum: Secondary | ICD-10-CM

## 2016-01-06 DIAGNOSIS — Z931 Gastrostomy status: Secondary | ICD-10-CM

## 2016-01-06 DIAGNOSIS — F419 Anxiety disorder, unspecified: Secondary | ICD-10-CM | POA: Diagnosis not present

## 2016-01-06 DIAGNOSIS — Z789 Other specified health status: Secondary | ICD-10-CM

## 2016-01-10 ENCOUNTER — Encounter: Payer: Self-pay | Admitting: Internal Medicine

## 2016-01-10 NOTE — Progress Notes (Signed)
Location:  [facility] [place] [facility] Service:  SNF (31)  Irven Shelling, MD  Patient Care Team: Lavone Orn, MD as PCP - General (Internal Medicine) Michael Boston, MD as Consulting Physician (General Surgery) Arta Silence, MD as Consulting Physician (Gastroenterology) Chesley Mires, MD as Consulting Physician (Pulmonary Disease)  Extended Emergency Contact Information Primary Emergency Contact: Jule Economy Address: [address]          [place], [place] [address] Johnnette Litter of [place] Phone: [phone] Mobile Phone: [phone] Relation: Spouse Secondary Emergency Contact: Norman of [place] Mobile Phone: [phone] Relation: Daughter    Allergies: Flomax [tamsulosin hcl]; Lisinopril; Lorazepam; and Uroxatral [alfuzosin hcl er]  Chief Complaint  Patient presents with  . Acute Visit    HPI: Patient is 75 y.o. male who is being seen for several acute issues. The first is anxiety. Pt has been getting haldol 1 mg qHS and we started 0.5 mg q 4p for sundowning per family request. Nursing is asking for haldol in the am as well, pt doesn't do well until his family gets to the SNF. Dietary met me with orders from the surgeon in which he wanted  Prior g tube feeds, I had rearranged them to decrease diarrhea pt was having and he wanted TO STOP FREE WATER PER TUBE. Family met me extremely upset by the surgeon and by the fact that pt was looking sicker. Nursing reported green d/c from his trach tube. Later in the day, as I was dealing with pt all day, Pt's Na+ returned at168.  Past Medical History:  Diagnosis Date  . Bilateral dry eyes   . BPH (benign prostatic hyperplasia)   . Central pterygium of right eye   . Coronary artery disease 1997, 2009   bare mental stent right coronart artery, occlusive followed by Dr. Pernell Dupre in [place]  . Degenerative disc disease    LOWER BACK  . Dysphonia   . ED (erectile  dysfunction)   . Gastroesophageal reflux disease   . Hyperlipidemia   . Hyperopia of both eyes with astigmatism and presbyopia   . Hypertension   . Nuclear senile cataract    [facility]. [facility] in [place]  . Peptic ulcer   . Pneumonia    HISTORY OF IN CHILDHOOD  . Posterior vitreous detachment 10/30/2012  . Salzmann's nodular dystrophy of left eye   . Urinary hesitancy   . Wears glasses     Past Surgical History:  Procedure Laterality Date  . CARDIAC CATHETERIZATION    . CHOLECYSTECTOMY    . ESOPHAGOGASTRODUODENOSCOPY N/A 12/01/2015   Procedure: ESOPHAGOGASTRODUODENOSCOPY (EGD) BALLOON DILATION OF DUODENAL STRICTURE;  Surgeon: Michael Boston, MD;  [facility];  Service: General;  Laterality: N/A;  . ESOPHAGOGASTRODUODENOSCOPY N/A 12/14/2015   Procedure: ESOPHAGOGASTRODUODENOSCOPY (EGD);  Surgeon: Clarene Essex, MD;  Location: [facility]OSCOPY;  Service: Endoscopy;  Laterality: N/A;  Patient has tracheostomy  . ESOPHAGOGASTRODUODENOSCOPY (EGD) WITH PROPOFOL N/A 07/01/2015   Procedure: ESOPHAGOGASTRODUODENOSCOPY (EGD) WITH PROPOFOL;  Surgeon: Arta Silence, MD;  [facility];  Service: Endoscopy;  Laterality: N/A;  . ESOPHAGOGASTRODUODENOSCOPY (EGD) WITH PROPOFOL N/A 11/23/2015   Procedure: ESOPHAGOGASTRODUODENOSCOPY (EGD) WITH PROPOFOL;  Surgeon: Arta Silence, MD;  Location: [facility] ENDOSCOPY;  Service: Endoscopy;  Laterality: N/A;  may need to intubate  . EYE SURGERY     growth on right eye removed   . IR GENERIC HISTORICAL  12/13/2015   IR [place] DUODEN/JEJUNO TUBE PERCUT W/FLUORO 12/13/2015 Marybelle Killings,  MD [facility]-INTERV RAD  . LAPAROSCOPIC GASTROSTOMY N/A 12/01/2015   Procedure: LAPAROSCOPIC PLACEMENT OF FEEDING JEJUNOSTOMY AND GASTROSTOMY TUBE;  Surgeon: Michael Boston, MD;  [facility];  Service: General;  Laterality: N/A;  . LAPAROSCOPY N/A 12/03/2015   Procedure: LAPAROSCOPY DIAGNOSTIC, OMENTOPEXY, JEJUNOSTOMY, [place] OUT;  Surgeon: Michael Boston, MD;  [facility]  ORS;  Service: General;  Laterality: N/A;  . STENT TO HEART     2 1997 AND 1 IN 1996  . TONSILLECTOMY    . XI ROBOTIC VAGOTOMY AND ANTRECTOMY N/A 09/25/2015   Procedure: XI ROBOTIC ANTERIOR AND POSTERIOR VAGOTOMY, BILROTH I  ANASTOMOSIS DOR FUNDIPLICATION, OMENTOPEXY  UPPER ENDOSCOPY;  Surgeon: Michael Boston, MD;  Location: [facility];  Service: General;  Laterality: N/A;      Medication List       Accurate as of 01/06/16 11:59 PM. Always use your most recent med list.          alum & mag hydroxide-simeth 200-200-20 MG/5ML suspension Commonly known as:  MAALOX/MYLANTA Take 30 mLs by mouth every 6 (six) hours as needed for indigestion or heartburn (or bloating).   aspirin EC 81 MG tablet Take 81 mg by mouth every evening.   atorvastatin 40 MG tablet Commonly known as:  LIPITOR Take 40 mg by mouth at bedtime.   bisacodyl 10 MG suppository Commonly known as:  DULCOLAX Place 1 suppository (10 mg total) rectally daily as needed for moderate constipation or severe constipation.   bismuth subsalicylate 250 NL/97QB suspension Commonly known as:  PEPTO BISMOL Take 30 mLs by mouth every 8 (eight) hours as needed for indigestion or diarrhea or loose stools.   calcium-vitamin D 500-200 MG-UNIT tablet Commonly known as:  OSCAL WITH D Take 1 tablet by mouth daily with breakfast.   feeding supplement (JEVITY 1.2 CAL) Liqd Place 474 mLs into feeding tube 3 (three) times daily.   feeding supplement (PRO-STAT SUGAR FREE 64) Liqd Place 30 mLs into feeding tube daily.   free water Soln Place 300 mLs into feeding tube every 8 (eight) hours.   haloperidol 1 MG tablet Commonly known as:  HALDOL Take 1 tablet (1 mg total) by mouth at bedtime as needed and may repeat dose one time if needed for agitation (insomnia).   liver oil-zinc oxide 40 % ointment Commonly known as:  DESITIN Apply topically 2 (two) times daily.   loperamide 1 MG/5ML solution Commonly known as:  IMODIUM Place 10 mLs  (2 mg total) into feeding tube as needed for diarrhea or loose stools (16m per G tube q8h PRN more than 2BMs q8H).   magic mouthwash Soln Take 15 mLs by mouth 4 (four) times daily as needed for mouth pain (sore throat).   metoCLOPramide 5 MG tablet Commonly known as:  REGLAN Take 1-2 tablets (5-10 mg total) by mouth every 6 (six) hours as needed for nausea or vomiting.   metoprolol tartrate 25 MG tablet Commonly known as:  LOPRESSOR Place 0.5 tablets (12.5 mg total) into feeding tube daily.   multivitamin tablet Take 2 tablets by mouth daily with breakfast.   nitroGLYCERIN 0.4 MG SL tablet Commonly known as:  NITROSTAT Place 0.4 mg under the tongue every 5 (five) minutes as needed for chest pain.   pantoprazole 40 MG tablet Commonly known as:  PROTONIX Take 40 mg by mouth 2 (two) times daily.   REFRESH OP Place 2 drops into both eyes as needed (For dry eyes.).   RESTASIS 0.05 % ophthalmic emulsion Generic drug:  cycloSPORINE Place 1 drop into both eyes 2 (two) times daily.   traMADol 50 MG tablet Commonly known as:  ULTRAM Take 1-2 tablets (50-100 mg total) by mouth every 6 (six) hours as needed for moderate pain or severe pain.   Vitamin B-12 1000 MCG Subl Place 1 tablet under the tongue daily with breakfast.       No orders of the defined types were placed in this encounter.    There is no immunization history on file for this patient.  Social History  Substance Use Topics  . Smoking status: Former Smoker    Packs/day: 2.00    Years: 20.00    Types: Cigarettes    Quit date: 02/15/1995  . Smokeless tobacco: Never Used  . Alcohol use No    Review of Systems  DATA OBTAINED: from patient, nurse, daughter and wife GENERAL:  no fevers,+ fatigue, appetite changes SKIN: No itching, rash HEENT: No complaint RESPIRATORY: No cough, wheezing, SOB, + green trach sputum CARDIAC: No chest pain, palpitations, lower extremity edema  GI: No abdominal pain, No N/V ;  cont diarrhea; no constipation, No heartburn or reflux  GU: No dysuria, frequency or urgency, or incontinence  MUSCULOSKELETAL:pt c/o back pain NEUROLOGIC: No headache, dizziness  PSYCHIATRIC: No overt anxiety or sadness; per family episodes of confusion  Vitals:   01/10/16 2055  BP: 125/72  Pulse: 64  Resp: 20  Temp: 98.4 F (36.9 C)   There is no height or weight on file to calculate BMI. Physical Exam  GENERAL APPEARANCE: mild lethargy, mod conversant, No acute distress  SKIN: No diaphoresis rash HEENT: Unremarkable RESPIRATORY: Breathing is even, unlabored. Lung sounds are rhonchi  CARDIOVASCULAR: Heart RRR no murmurs, rubs or gallops. No peripheral edema  GASTROINTESTINAL: Abdomen is soft, non-tender, not distended w/ normal bowel sounds.  GENITOURINARY: Bladder non tender, not distended  MUSCULOSKELETAL: No abnormal joints or musculature NEUROLOGIC: Cranial nerves 2-12 grossly intact. Moves all extremities PSYCHIATRIC: Mood and affect appropriate to situation, no behavioral issues  Patient Active Problem List   Diagnosis Date Noted  . 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 Vitamin B12 deficiency 01/02/2016  . BPH (benign prostatic hyperplasia)   . Posterior vitreous detachment   . Salzmann's nodular dystrophy of left eye   . Hyperopia of both eyes with astigmatism and presbyopia   . Central pterygium of right eye   . Pressure injury of skin 12/30/2015  . Hypernatremia 12/28/2015  . Tracheostomy in place [facility]) 12/25/2015  . Pneumatosis of intestines 12/11/2015  . Tobacco abuse 12/09/2015  . Ischemic right index finger at tip 12/09/2015  . Gastritis 12/09/2015  . Gastric tube present (LUQ) 12/09/2015  . Jejunostomy tube present (LLQ) 12/09/2015  . Acute respiratory failure with hypoxemia ([place])   . Protein-calorie malnutrition, severe 12/04/2015  . Encephalopathy acute 12/03/2015  . Bile peritonitis due to gastric tube dislodgement 12/03/2015  . Gastric  Ileus  11/25/2015  . Gastric outlet obstruction 11/24/2015  . Pyloric stricture 11/24/2015  . Anxiety state 09/28/2015  . Hyperlipidemia   . 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 Coronary atherosclerosis of native coronary artery 04/24/2013  . Essential hypertension, benign 04/24/2013  . Other and unspecified hyperlipidemia 04/24/2013  . Central pterygium 10/30/2012  . 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 Cataract, nuclear sclerotic senile 10/30/2012    CMP     Component Value Date/Time   NA 153 (H) 12/29/2015 0900   NA  153 (A) 12/29/2015   K 3.8 12/29/2015 0900   CL 123 (H) 12/29/2015 0900   CO2 23 12/29/2015 0900   GLUCOSE 117 (H) 12/29/2015 0900   BUN 63 (H) 12/29/2015 0900   BUN 63 (A) 12/29/2015   CREATININE 1.44 (H) 12/29/2015 0900   CALCIUM 7.5 (L) 12/29/2015 0900   PROT 5.8 (L) 12/28/2015 0419   ALBUMIN 1.6 (L) 12/28/2015 0419   AST 42 (H) 12/28/2015 0419   ALT 51 12/28/2015 0419   ALKPHOS 340 (H) 12/28/2015 0419   BILITOT 0.8 12/28/2015 0419   GFRNONAA 46 (L) 12/29/2015 0900   GFRAA 53 (L) 12/29/2015 0900    Recent Labs  12/21/15 0530  12/24/15 0435  12/27/15 0348 12/28/15 12/28/15 0419 12/29/15 12/29/15 0900  NA 147*  < > 152*  < > 155* 154* 154* 153* 153*  K 3.5  < > 3.8  < > 3.7 3.7 3.7  --  3.8  CL 115*  < > 121*  < > 125*  --  124*  --  123*  CO2 24  < > 23  < > 24  --  24  --  23  GLUCOSE 118*  < > 140*  < > 133*  --  132*  --  117*  BUN 40*  < > 58*  < > 79* 69* 69* 63* 63*  CREATININE 1.06  < > 1.19  < > 1.33* 1.3 1.25* 1.4* 1.44*  CALCIUM 8.4*  < > 8.0*  < > 7.3*  --  7.3*  --  7.5*  MG 2.0  --  2.2  --   --   --  2.1  --   --   PHOS 4.3  --  3.4  --   --   --  2.8  --   --   < > = values in this interval not displayed.  Recent Labs  12/21/15 0530 12/24/15 0435 12/28/15 0419  AST 31 43* 42*  ALT 28 44 51  ALKPHOS 212* 305* 340*  BILITOT 1.4* 1.1 0.8    PROT 6.1* 6.2* 5.8*  ALBUMIN 1.6* 1.6* 1.6*    Recent Labs  12/14/15 0549 12/15/15 0424 12/18/15 1159 12/21/15 0530 12/29/15 12/29/15 0900  WBC 7.7 10.2 10.6* 8.9 15.3 15.3*  NEUTROABS 5.1 7.0  --  6.1  --   --   HGB 10.3* 8.8* 8.6* 8.4*  --  7.8*  HCT 31.0* 26.4* 26.0* 25.9*  --  24.7*  MCV 88.8 89.2 87.5 89.0  --  89.8  PLT 516* 633* 573* 473*  --  509*    Recent Labs  12/10/15 0418 12/14/15 0549 12/21/15 0530  TRIG 214* 194* 199*   No results found for: MICROALBUR No results found for: TSH No results found for: HGBA1C Lab Results  Component Value Date   TRIG 199 (H) 12/21/2015    Significant Diagnostic Results in last 30 days:  Dg Abd 1 View  Result Date: 12/18/2015 CLINICAL DATA:  Jejunostomy tube replacement. EXAM: ABDOMEN - 1 VIEW COMPARISON:  Procedural spot fluoroscopic 12/13/2015. CT abdomen and pelvis 12/10/2015. FINDINGS: Percutaneous jejunostomy tube overlies the left mid abdomen. Injected contrast opacifies an adjacent small bowel loop. A small amount of contrast is partially visualized in the left upper quadrant in the expected region of the GE junction and may be within the distal esophagus and stomach. Abdominal surgical clips are present. Gas is present in grossly nondilated loops of small and large bowel to the level of  the rectum without evidence of obstruction. IMPRESSION: Satisfactory positioning of percutaneous jejunostomy tube based on appearance of injected contrast material. Electronically Signed   By: Logan Bores M.D.   On: 12/18/2015 10:13   Ir Replc Duoden/jejuno Tube Percut W/fluoro  Result Date: 12/15/2015 INDICATION: Jejunostomy tube pulled out EXAM: IR REPLACE DUODEN/JEJUNO TUBE PERCUT WITH FLOURO MEDICATIONS: None ANESTHESIA/SEDATION: None CONTRAST:  68m ISOVUE-300 IOPAMIDOL (ISOVUE-300) INJECTION 61% - administered into the gastric lumen. FLUOROSCOPY TIME:  Fluoroscopy Time: 1 minutes 30 seconds (11.4 mGy). COMPLICATIONS: None immediate.  PROCEDURE: Informed written consent was obtained from the patient after a thorough discussion of the procedural risks, benefits and alternatives. All questions were addressed. Maximal Sterile Barrier Technique was utilized including caps, mask, sterile gowns, sterile gloves, sterile drape, hand hygiene and skin antiseptic. A timeout was performed prior to the initiation of the procedure. The jejunostomy tube was completely pulled out. A Kumpe catheter was advanced into the jejunostomy tube entry site. Contrast was injected. The catheter was advanced over a Bentson into the loop of jejunum. The Kumpe be was exchanged over a Bentson for a 12 French pigtail catheter. This was advanced into the jejunum, looped, and string fixed, then sewn to the skin. Contrast was injected. It was then attached to a gravity drainage bag. FINDINGS: Imaging confirms replacement of the jejunostomy tube in the left lower quadrant as described. IMPRESSION: Successful left lower quadrant jejunostomy tube replacement. Electronically Signed   By: AMarybelle KillingsM.D.   On: 12/15/2015 08:19   Dg Swallowing Func-speech Pathology  Result Date: 12/29/2015 Objective Swallowing Evaluation: Type of Study: MBS-Modified Barium Swallow Study Patient Details Name: RTHADDAEUS GRANJAMRN: [id number] of Birth: 1August 03, 1942Today's Date: 12/29/2015 Time: SLP Start Time (ACUTE ONLY): 0905-SLP Stop Time (ACUTE ONLY): 0940 SLP Time Calculation (min) (ACUTE ONLY): 35 min Past Medical History: Past Medical History: Diagnosis Date . Bilateral dry eyes  . Coronary artery disease 1997  bare mental stent right coronart artery . Degenerative disc disease   LOWER BACK . Dysphonia  . ED (erectile dysfunction)  . Gastroesophageal reflux disease  . Hyperlipidemia  . Hypertension  . Peptic ulcer  . Pneumonia   HISTORY OF IN CHILDHOOD . Posterior vitreous detachment 10/30/2012 . Urinary hesitancy  . Wears glasses  Past Surgical History: Past Surgical History: Procedure  Laterality Date . CARDIAC CATHETERIZATION   . CHOLECYSTECTOMY   . ESOPHAGOGASTRODUODENOSCOPY N/A 12/01/2015  Procedure: ESOPHAGOGASTRODUODENOSCOPY (EGD) BALLOON DILATION OF DUODENAL STRICTURE;  Surgeon: SMichael Boston MD;  [facility];  Service: General;  Laterality: N/A; . ESOPHAGOGASTRODUODENOSCOPY N/A 12/14/2015  Procedure: ESOPHAGOGASTRODUODENOSCOPY (EGD);  Surgeon: MClarene Essex MD;  [facility];  Service: Endoscopy;  Laterality: N/A;  Patient has tracheostomy . ESOPHAGOGASTRODUODENOSCOPY (EGD) WITH PROPOFOL N/A 07/01/2015  Procedure: ESOPHAGOGASTRODUODENOSCOPY (EGD) WITH PROPOFOL;  Surgeon: WArta Silence MD;  [facility];  Service: Endoscopy;  Laterality: N/A; . ESOPHAGOGASTRODUODENOSCOPY (EGD) WITH PROPOFOL N/A 11/23/2015  Procedure: ESOPHAGOGASTRODUODENOSCOPY (EGD) WITH PROPOFOL;  Surgeon: WArta Silence MD;  [facility]  Service: Endoscopy;  Laterality: N/A;  may need to intubate . EYE SURGERY    growth on right eye removed  . IR GENERIC HISTORICAL  12/13/2015  IR [place] DUODEN/JEJUNO TUBE PERCUT W/FLUORO 12/13/2015 AMarybelle Killings MD [facility]-INTER[facility] RAD . LAPAROSCOPIC GASTROSTOMY N/A 12/01/2015  Procedure: LAPAROSCOPIC PLACEMENT OF FEEDING JEJUNOSTOMY AND GASTROSTOMY TUBE;  Surgeon: SMichael Boston MD;  [facility];  Service: General;  Laterality: N/A; . LAPAROSCOPY N/A 12/03/2015  Procedure: LAPAROSCOPY DIAGNOSTIC, OMENTOPEXY, JEJUNOSTOMY, [place];  Surgeon:  Michael Boston, MD;  Location: [facility];  Service: General;  Laterality: N/A; . STENT TO HEART    2 1997 AND 1 IN 1996 . TONSILLECTOMY   . XI ROBOTIC VAGOTOMY AND ANTRECTOMY N/A 09/25/2015  Procedure: XI ROBOTIC ANTERIOR AND POSTERIOR VAGOTOMY, BILROTH I  ANASTOMOSIS DOR FUNDIPLICATION, OMENTOPEXY  UPPER ENDOSCOPY;  Surgeon: Michael Boston, MD;  Location: [facility];  Service: General;  Laterality: N/A; HPI: 75 yo male former smoker with progressive epigastric pain, nausea, vomiting, bloating from gastric outlet obstruction with  pyloric channel ulcer.  Had [place] 1, vagotomy, fundoplication, omentopexy August 2017 with initial improvement.  Had recurrent symptoms September 2017 that become progressively worse with gastric dilation from recurrent stricture.  Had laparoscopic placement of jejunostomy tube, gastrostomy tube, and EGD balloon dilation of duodenal stricture 10/17.  Developed gastric leakage with peritonitis after pulling out PEG tube 10/19 and taken to OR. On 10/27 found 2 new LUQ abscesses/fluid collections which were drained percutaneously by IR. Had perc trach 10/27.  As of 10/30 his mental status has improved.  Pt underwent MBS and has been on TPN since due to level of dysphagia.  Ice chips have been consumed with improved tolerance.  MD ordered repeat MBS.   Subjective: pt awake in bed, weak Assessment / Plan / Recommendation CHL IP CLINICAL IMPRESSIONS 12/29/2015 Therapy Diagnosis Moderate oral phase dysphagia;Moderate pharyngeal phase dysphagia;Moderate cervical esophageal phase dysphagia Clinical Impression Patient continues with moderate oropharyngeal dysphagia that appears marginally worse than prior MBS 12/17/15 results.  Swallow function continues to be significantly weak resulting in residuals and aspiration with poor cough mechanism.   Pt again fatigued during MBS and at times had difficulty keeping his eyes open.   Viscous green tinged secretions coughed through trach (blew off PMSV) after oral care and before barium administration.  Pt also with copious secretions coughed AROUND trach following MBS *pt had PMSV in place.   Gross weakness secretion retention that mixed with barium without consistent awareness.  Delayed oral transiting, lingual pumping and premature spillage of barium into pharynx noted due to weakness.  Compromised tongue base retraction and laryngeal elevation allowed laryngeal penetration and SILENT aspiration both during and after the swallow.  Cues to cough/throat clear and dry swallow did not  fully clear penetrates and patient quickly re-penetrated pharyngeal residuals.  In addition patient appeared with decreased proximal esophageal opening/decreased CP opening resulting in residuals in pyriform sinus.  Unfortunately patient is at high risk of aspiration with intake at this time.  Using teach back/live video educated patient and daughter to findings and reinforced effective compensation strategies.   Recommend continue NPO except single ice chips with strict precautions - oral care, pmsv in place and cueing patient to cough/clear throat and re-swallow.  Pt also with increase in delayed responses to SLP instructions/cues today- and states "I'm weak".  Given pt's lack of swallow improvement, SlP is concerned for swallow prognosis during acute stay.  Pt will need repeat MBS in future due to silent nature of dysphagia. Will follow up for dysphagia treatment/management.   Thanks for allowing me to help care for this patient.  Impact on safety and function Severe aspiration risk;Risk for inadequate nutrition/hydration   CHL IP TREATMENT RECOMMENDATION 12/29/2015 Treatment Recommendations F/U MBS in --- days (Comment)   Prognosis 12/29/2015 Prognosis for Safe Diet Advancement Guarded Barriers to Reach Goals Severity of deficits;Cognitive deficits Barriers/Prognosis Comment -- CHL IP DIET RECOMMENDATION 12/29/2015 SLP Diet Recommendations NPO;Ice chips PRN after oral care Liquid Administration via --  Medication Administration Via alternative means Compensations Minimize environmental distractions;Slow rate;Multiple dry swallows after each bite/sip;Hard cough after swallow Postural Changes --   CHL IP OTHER RECOMMENDATIONS 12/29/2015 Recommended Consults -- Oral Care Recommendations Oral care QID Other Recommendations --   CHL IP FOLLOW UP RECOMMENDATIONS 12/29/2015 Follow up Recommendations Skilled Nursing facility;Inpatient Rehab   CHL IP FREQUENCY AND DURATION 12/29/2015 Speech Therapy Frequency (ACUTE ONLY)  min 2x/week Treatment Duration 2 weeks      CHL IP ORAL PHASE 12/29/2015 Oral Phase Impaired Oral - Pudding Teaspoon -- Oral - Pudding Cup -- Oral - Honey Teaspoon -- Oral - Honey Cup -- Oral - Nectar Teaspoon -- Oral - Nectar Cup -- Oral - Nectar Straw -- Oral - Thin Teaspoon Weak lingual manipulation;Reduced posterior propulsion;Lingual/palatal residue;Decreased bolus cohesion;Delayed oral transit;Premature spillage Oral - Thin Cup NT Oral - Thin Straw NT Oral - Puree Weak lingual manipulation;Lingual/palatal residue;Reduced posterior propulsion;Premature spillage;Decreased bolus cohesion;Delayed oral transit Oral - Mech Soft -- Oral - Regular -- Oral - Multi-Consistency -- Oral - Pill -- Oral Phase - Comment --  CHL IP PHARYNGEAL PHASE 12/29/2015 Pharyngeal Phase Impaired Pharyngeal- Pudding Teaspoon -- Pharyngeal -- Pharyngeal- Pudding Cup -- Pharyngeal -- Pharyngeal- Honey Teaspoon -- Pharyngeal -- Pharyngeal- Honey Cup -- Pharyngeal -- Pharyngeal- Nectar Teaspoon Reduced pharyngeal peristalsis;Reduced epiglottic inversion;Reduced anterior laryngeal mobility;Reduced laryngeal elevation;Reduced airway/laryngeal closure;Reduced tongue base retraction;Penetration/Aspiration during swallow;Pharyngeal residue - valleculae;Lateral channel residue;Pharyngeal residue - cp segment Pharyngeal Material enters airway, remains ABOVE vocal cords and not ejected out Pharyngeal- Nectar Cup Delayed swallow initiation-pyriform sinuses;Reduced pharyngeal peristalsis;Reduced epiglottic inversion;Reduced anterior laryngeal mobility;Reduced laryngeal elevation;Reduced airway/laryngeal closure;Reduced tongue base retraction;Penetration/Aspiration during swallow;Penetration/Apiration after swallow;Significant aspiration (Amount);Pharyngeal residue - valleculae;Pharyngeal residue - pyriform;Lateral channel residue;Pharyngeal residue - cp segment Pharyngeal Material enters airway, passes BELOW cords without attempt by patient to eject  out (silent aspiration);Material enters airway, passes BELOW cords and not ejected out despite cough attempt by patient Pharyngeal- Nectar Straw -- Pharyngeal -- Pharyngeal- Thin Teaspoon Delayed swallow initiation-vallecula;Reduced pharyngeal peristalsis;Reduced epiglottic inversion;Reduced anterior laryngeal mobility;Reduced laryngeal elevation;Reduced airway/laryngeal closure;Reduced tongue base retraction;Penetration/Aspiration during swallow;Penetration/Aspiration before swallow;Pharyngeal residue - valleculae;Lateral channel residue;Trace aspiration;Pharyngeal residue - cp segment Pharyngeal Material enters airway, passes BELOW cords without attempt by patient to eject out (silent aspiration) Pharyngeal- Thin Cup NT Pharyngeal -- Pharyngeal- Thin Straw NT Pharyngeal -- Pharyngeal- Puree Delayed swallow initiation-vallecula;Reduced epiglottic inversion;Reduced pharyngeal peristalsis;Reduced anterior laryngeal mobility;Reduced laryngeal elevation;Reduced airway/laryngeal closure;Reduced tongue base retraction;Pharyngeal residue - valleculae;Pharyngeal residue - pyriform Pharyngeal -- Pharyngeal- Mechanical Soft -- Pharyngeal -- Pharyngeal- Regular -- Pharyngeal -- Pharyngeal- Multi-consistency -- Pharyngeal -- Pharyngeal- Pill -- Pharyngeal -- Pharyngeal Comment pt continues with gross residuals and poor awareness, WEAK dry swallow/throat clear and cough were ineffective to fully clear aspiration/penetration/residuals, pt did have viscous colored secretions *green tinged* coughed and expectorated from trach prior to MBS and around the trach AFTER   CHL IP CERVICAL ESOPHAGEAL PHASE 12/29/2015 Cervical Esophageal Phase -- Pudding Teaspoon -- Pudding Cup -- Honey Teaspoon -- Honey Cup -- Nectar Teaspoon Prominent cricopharyngeal segment;Reduced cricopharyngeal relaxation Nectar Cup Prominent cricopharyngeal segment;Reduced cricopharyngeal relaxation Nectar Straw -- Thin Teaspoon Reduced cricopharyngeal  relaxation;Prominent cricopharyngeal segment Thin Cup NT Thin Straw NT Puree Reduced cricopharyngeal relaxation;Prominent cricopharyngeal segment Mechanical Soft -- Regular -- Multi-consistency -- Pill -- Cervical Esophageal Comment -- No flowsheet data found. Luanna Salk, [place] [facility] SLP [phone]              Dg Swallowing Func-speech Pathology  Result Date: 12/17/2015 Objective Swallowing Evaluation: Type of Study: MBS-Modified Barium  Swallow Study Patient Details Name: ANGELLO CHIEN MRN: [id number] Date of Birth: [birth year] Today's Date: 12/17/2015 Time: SLP Start Time (ACUTE ONLY): 0925-SLP Stop Time (ACUTE ONLY): 0945 SLP Time Calculation (min) (ACUTE ONLY): 20 min Past Medical History: Past Medical History: Diagnosis Date . Bilateral dry eyes  . Coronary artery disease 1997  bare mental stent right coronart artery . Degenerative disc disease   LOWER BACK . Dysphonia  . ED (erectile dysfunction)  . Gastroesophageal reflux disease  . Hyperlipidemia  . Hypertension  . Peptic ulcer  . Pneumonia   HISTORY OF IN CHILDHOOD . Posterior vitreous detachment 10/30/2012 . Urinary hesitancy  . Wears glasses  Past Surgical History: Past Surgical History: Procedure Laterality Date . CARDIAC CATHETERIZATION   . CHOLECYSTECTOMY   . ESOPHAGOGASTRODUODENOSCOPY N/A 12/01/2015  Procedure: ESOPHAGOGASTRODUODENOSCOPY (EGD) BALLOON DILATION OF DUODENAL STRICTURE;  Surgeon: Michael Boston, MD;  Location: [facility];  Service: General;  Laterality: N/A; . ESOPHAGOGASTRODUODENOSCOPY N/A 12/14/2015  Procedure: ESOPHAGOGASTRODUODENOSCOPY (EGD);  Surgeon: Clarene Essex, MD;  Location: [facility];  Service: Endoscopy;  Laterality: N/A;  Patient has tracheostomy . ESOPHAGOGASTRODUODENOSCOPY (EGD) WITH PROPOFOL N/A 07/01/2015  Procedure: ESOPHAGOGASTRODUODENOSCOPY (EGD) WITH PROPOFOL;  Surgeon: Arta Silence, MD;  Location: [facility];  Service: Endoscopy;  Laterality: N/A; . ESOPHAGOGASTRODUODENOSCOPY (EGD) WITH PROPOFOL N/A 11/23/2015   Procedure: ESOPHAGOGASTRODUODENOSCOPY (EGD) WITH PROPOFOL;  Surgeon: Arta Silence, MD;  Location: [facility];  Service: Endoscopy;  Laterality: N/A;  may need to intubate . EYE SURGERY    growth on right eye removed  . IR GENERIC HISTORICAL  12/13/2015  IR REPLC DUODEN/JEJUNO TUBE PERCUT W/FLUORO 12/13/2015 Marybelle Killings, MD [facility]-INTER[facility] RAD . LAPAROSCOPIC GASTROSTOMY N/A 12/01/2015  Procedure: LAPAROSCOPIC PLACEMENT OF FEEDING JEJUNOSTOMY AND GASTROSTOMY TUBE;  Surgeon: Michael Boston, MD;  Location: [facility];  Service: General;  Laterality: N/A; . LAPAROSCOPY N/A 12/03/2015  Procedure: LAPAROSCOPY DIAGNOSTIC, OMENTOPEXY, JEJUNOSTOMY, [place] OUT;  Surgeon: Michael Boston, MD;  Location: [facility];  Service: General;  Laterality: N/A; . STENT TO HEART    2 1997 AND 1 IN 1996 . TONSILLECTOMY   . XI ROBOTIC VAGOTOMY AND ANTRECTOMY N/A 09/25/2015  Procedure: XI ROBOTIC ANTERIOR AND POSTERIOR VAGOTOMY, BILROTH I  ANASTOMOSIS DOR FUNDIPLICATION, OMENTOPEXY  UPPER ENDOSCOPY;  Surgeon: Michael Boston, MD;  Location: [facility];  Service: General;  Laterality: N/A; HPI: 75 yo male former smoker with progressive epigastric pain, nausea, vomiting, bloating from gastric outlet obstruction with pyloric channel ulcer.  Had Billroth 1, vagotomy, fundoplication, omentopexy August 2017 with initial improvement.  Had recurrent symptoms September 2017 that become progressively worse with gastric dilation from recurrent stricture.  Had laparoscopic placement of jejunostomy tube, gastrostomy tube, and EGD balloon dilation of duodenal stricture 10/17.  Developed gastric leakage with peritonitis after pulling out PEG tube 10/19 and taken to OR. On 10/27 found 2 new LUQ abscesses/fluid collections which were drained percutaneously by IR. Had perc trach 10/27.  As of 10/30 his mental status has improved, he has remained off of vasopressors for several days and he has done well with weaning off the ventilator per MD note.  Subjective: pt awake in bed  Assessment / Plan / Recommendation CHL IP CLINICAL IMPRESSIONS 12/17/2015 Therapy Diagnosis Moderate oral phase dysphagia;Moderate pharyngeal phase dysphagia;Moderate cervical esophageal phase dysphagia Clinical Impression Patient presents with moderate oropharyngeal dysphagia characterized by significant weakness resulting in residuals and aspiration.  Pt was only given minimal amount of thin and nectar barium during testing.  Of note, after patient was transferred to flouro chair, he demonstrated copious  coughing.  He cleared scant secretions through trach tube at that time.  Transferring to chair and coughing episode significantly fatigued patient.  PMSV was placed for MBS with good tolerance during testing.  Unfortunately patient is grossly weak resulting in oropharyngeal deficits and secretion retention that mixed with barium.  Delayed oral transiting, lingual pumping and premature spillage of barium into pharynx noted due to weakness.  Compromised tongue base retraction and laryngeal elevation allowed laryngeal penetration and SILENT aspiration both during and after the swallow.  Cues to cough/throat clear and dry swallow did not fully clear penetrates and patient quickly re-penetrated pharyngeal residuals.  In addition patient appeared with decreased proximal esophageal opening/decreased CP opening resulting in residuals in pyriform sinus.  Unfortunately patient is at high risk of aspiration with intake at this time.  Using teach back/live video educated patient to findings and reinforced effective compensation strategies.   Recommend continue NPO except single ice chips with strict precautions - oral care, pmsv in place and cueing patient to cough/clear throat and re-swallow.  Pt will need repeat MBS in future due to silent nature of dysphagia. Will follow up for dysphagia treatment/management.   Thanks for allowing me to help care for this patient.  Impact on safety and function Severe aspiration risk;Risk  for inadequate nutrition/hydration   CHL IP TREATMENT RECOMMENDATION 12/17/2015 Treatment Recommendations F/U MBS in --- days (Comment)   Prognosis 12/17/2015 Prognosis for Safe Diet Advancement Fair Barriers to Reach Goals Severity of deficits;Cognitive deficits Barriers/Prognosis Comment -- CHL IP DIET RECOMMENDATION 12/17/2015 SLP Diet Recommendations NPO;Ice chips PRN after oral care Liquid Administration via -- Medication Administration Via alternative means Compensations Minimize environmental distractions;Slow rate;Multiple dry swallows after each bite/sip;Hard cough after swallow Postural Changes --   CHL IP OTHER RECOMMENDATIONS 12/17/2015 Recommended Consults -- Oral Care Recommendations Oral care QID Other Recommendations --   CHL IP FOLLOW UP RECOMMENDATIONS 12/17/2015 Follow up Recommendations Skilled Nursing facility;Inpatient Rehab   CHL IP FREQUENCY AND DURATION 12/17/2015 Speech Therapy Frequency (ACUTE ONLY) min 2x/week Treatment Duration 2 weeks      CHL IP ORAL PHASE 12/17/2015 Oral Phase Impaired Oral - Pudding Teaspoon -- Oral - Pudding Cup -- Oral - Honey Teaspoon -- Oral - Honey Cup -- Oral - Nectar Teaspoon -- Oral - Nectar Cup Weak lingual manipulation;Reduced posterior propulsion;Delayed oral transit;Decreased bolus cohesion;Lingual pumping Oral - Nectar Straw -- Oral - Thin Teaspoon Weak lingual manipulation;Delayed oral transit;Decreased bolus cohesion;Reduced posterior propulsion;Lingual pumping Oral - Thin Cup Weak lingual manipulation;Reduced posterior propulsion;Delayed oral transit;Decreased bolus cohesion;Lingual pumping Oral - Thin Straw Weak lingual manipulation;Decreased bolus cohesion;Delayed oral transit;Reduced posterior propulsion;Lingual pumping Oral - Puree -- Oral - Mech Soft -- Oral - Regular -- Oral - Multi-Consistency -- Oral - Pill -- Oral Phase - Comment --  CHL IP PHARYNGEAL PHASE 12/17/2015 Pharyngeal Phase Impaired Pharyngeal- Pudding Teaspoon -- Pharyngeal -- Pharyngeal-  Pudding Cup -- Pharyngeal -- Pharyngeal- Honey Teaspoon -- Pharyngeal -- Pharyngeal- Honey Cup -- Pharyngeal -- Pharyngeal- Nectar Teaspoon Reduced laryngeal elevation;Pharyngeal residue - pyriform;Pharyngeal residue - valleculae;Reduced tongue base retraction;Reduced airway/laryngeal closure;Reduced epiglottic inversion;Penetration/Aspiration during swallow Pharyngeal Material enters airway, passes BELOW cords without attempt by patient to eject out (silent aspiration) Pharyngeal- Nectar Cup Reduced laryngeal elevation;Reduced tongue base retraction;Reduced anterior laryngeal mobility;Reduced epiglottic inversion;Reduced airway/laryngeal closure;Penetration/Aspiration during swallow Pharyngeal Material enters airway, passes BELOW cords without attempt by patient to eject out (silent aspiration) Pharyngeal- Nectar Straw -- Pharyngeal -- Pharyngeal- Thin Teaspoon Reduced laryngeal elevation;Reduced airway/laryngeal closure;Reduced tongue base retraction;Penetration/Aspiration during swallow;Reduced  epiglottic inversion;Trace aspiration;Pharyngeal residue - valleculae;Pharyngeal residue - pyriform;Pharyngeal residue - cp segment Pharyngeal Material enters airway, passes BELOW cords without attempt by patient to eject out (silent aspiration) Pharyngeal- Thin Cup Moderate aspiration;Penetration/Aspiration during swallow;Penetration/Apiration after swallow;Reduced pharyngeal peristalsis;Reduced epiglottic inversion;Reduced laryngeal elevation;Reduced airway/laryngeal closure;Reduced tongue base retraction Pharyngeal Material enters airway, passes BELOW cords without attempt by patient to eject out (silent aspiration) Pharyngeal- Thin Straw Reduced pharyngeal peristalsis;Reduced epiglottic inversion;Reduced anterior laryngeal mobility;Reduced laryngeal elevation;Reduced airway/laryngeal closure;Pharyngeal residue - valleculae;Pharyngeal residue - pyriform;Moderate aspiration;Penetration/Aspiration during swallow  Pharyngeal Material enters airway, passes BELOW cords without attempt by patient to eject out (silent aspiration) Pharyngeal- Puree -- Pharyngeal -- Pharyngeal- Mechanical Soft -- Pharyngeal -- Pharyngeal- Regular -- Pharyngeal -- Pharyngeal- Multi-consistency -- Pharyngeal -- Pharyngeal- Pill -- Pharyngeal -- Pharyngeal Comment pt without awareness to residuals, penetration and aspiration did not fully clear despite cued cough, pt also with aspiration of secretions mixed with barium  CHL IP CERVICAL ESOPHAGEAL PHASE 12/17/2015 Cervical Esophageal Phase Impaired Pudding Teaspoon -- Pudding Cup -- Honey Teaspoon -- Honey Cup -- Nectar Teaspoon Reduced cricopharyngeal relaxation;Prominent cricopharyngeal segment Nectar Cup Reduced cricopharyngeal relaxation;Prominent cricopharyngeal segment Nectar Straw -- Thin Teaspoon Reduced cricopharyngeal relaxation;Prominent cricopharyngeal segment Thin Cup Reduced cricopharyngeal relaxation;Prominent cricopharyngeal segment Thin Straw Reduced cricopharyngeal relaxation;Prominent cricopharyngeal segment Puree -- Mechanical Soft -- Regular -- Multi-consistency -- Pill -- Cervical Esophageal Comment pt appeared with tight UES  Luanna Salk, MS [facility] SLP [phone]               Assessment and Plan  ANXIETY  SNF - have added Haldol 0.5 mg at 6 am  G TUBE FEEDINGS - pt is losing ground with them, still with diarrhea which ia a little better when boluses are spread out; however pt needs nutrition and I know family wants TPN and I am not opposed; PICC line ordered to be put in and have gotten TPN established with [facility] and it will start in am which family s fine with, they would rather he be here than at hospital if possible  GREEN TRACH SPUTUM - have orderd CXR, duonebs TID, mucinex; if pt has PNA its going to be aspiration;   HYPERNATREMIA - Na+168, K+ 4.4 - in the face of this will not do any G tube  feedings with TPN as planned and start 1/2 NS at 125 cc/hr  for 2 liters then 75 cc/hr. We have started a peripheral IV but plan to get a double lumen PICC. Also will inc free water to 250 cc q 4 hours ( up from 150 mg q 4)  and 100 cc q 6 hours per tube. Pt's free water deficit is 7+ liters. I will have a BMP done q am and called to me even though I am not on call. I can not lower Na+ more than 10 per 24 hour period. I have the names and numbers of all the holiday staff and will speak to them daily, if not more.     Time spent > 90 minutes;> 50% of time with patient was spent reviewing records, labs, tests and studies, counseling and developing plan of care  Inocencio Homes, MD

## 2016-01-11 ENCOUNTER — Encounter: Payer: Self-pay | Admitting: Internal Medicine

## 2016-01-11 ENCOUNTER — Non-Acute Institutional Stay (SKILLED_NURSING_FACILITY): Payer: Medicare Other | Admitting: Internal Medicine

## 2016-01-11 DIAGNOSIS — Z789 Other specified health status: Secondary | ICD-10-CM | POA: Diagnosis not present

## 2016-01-11 DIAGNOSIS — E870 Hyperosmolality and hypernatremia: Secondary | ICD-10-CM | POA: Diagnosis not present

## 2016-01-11 DIAGNOSIS — J690 Pneumonitis due to inhalation of food and vomit: Secondary | ICD-10-CM | POA: Diagnosis not present

## 2016-01-11 LAB — LIPID PANEL: TRIGLYCERIDES: 177 mg/dL — AB (ref 40–160)

## 2016-01-11 NOTE — Progress Notes (Signed)
Location:  [facility] Room Number: [phone] Place of Service:  SNF [phone])  [name]. [name], MD  Patient Care Team: [name], MD as PCP - General (Internal Medicine) Karie Soda, MD as Consulting Physician (General Surgery) Willis Modena, MD as Consulting Physician (Gastroenterology) Coralyn Helling, MD as Consulting Physician (Pulmonary Disease)  Extended Emergency Contact Information Primary Emergency Contact: [name] Address: [address]          [place], [place] [address] [name] of [place] Home Phone: [phone] Mobile Phone: [phone] Relation: Spouse Secondary Emergency Contact: [name] [place] of [place] Mobile Phone: [phone] Relation: Daughter    Allergies: Flomax [tamsulosin hcl]; Lisinopril; Lorazepam; and Uroxatral [alfuzosin hcl er]  Chief Complaint  Patient presents with  . Acute Visit    Acute    HPI: Patient is 75 y.o. male who is being seen today in f/u for PNA, hypernatremia and newly started TPN. Pt has not been seen by me over Thanksgiving holiday but I have been managing him per phone with SNF staff daily, if. not twice daily. Pt looks a thousand times better today than 5 days ago and wife looks more relaxed than I have ever seen her.  Past Medical History:  Diagnosis Date  . Bilateral dry eyes   . BPH (benign prostatic hyperplasia)   . Central pterygium of right eye   . Coronary artery disease 1997, 2009   bare mental stent right coronart artery, occlusive followed by Dr. Garnette Scheuermann in [place]  . Degenerative disc disease    LOWER BACK  . Dysphonia   . ED (erectile dysfunction)   . Gastroesophageal reflux disease   . Hyperlipidemia   . Hyperopia of both eyes with astigmatism and presbyopia   . Hypertension   . Nuclear senile cataract    [facility]. [facility] [place]  . Peptic ulcer   . Pneumonia    HISTORY OF IN CHILDHOOD  . Posterior vitreous  detachment 10/30/2012  . Salzmann's nodular dystrophy of left eye   . Urinary hesitancy   . Wears glasses     Past Surgical History:  Procedure Laterality Date  . CARDIAC CATHETERIZATION    . CHOLECYSTECTOMY    . ESOPHAGOGASTRODUODENOSCOPY N/A 12/01/2015   Procedure: ESOPHAGOGASTRODUODENOSCOPY (EGD) BALLOON DILATION OF DUODENAL STRICTURE;  Surgeon: Karie Soda, MD;  Location: [facility];  Service: General;  Laterality: N/A;  . ESOPHAGOGASTRODUODENOSCOPY N/A 12/14/2015   Procedure: ESOPHAGOGASTRODUODENOSCOPY (EGD);  Surgeon: Vida Rigger, MD;  Location: [facility];  Service: Endoscopy;  Laterality: N/A;  Patient has tracheostomy  . ESOPHAGOGASTRODUODENOSCOPY (EGD) WITH PROPOFOL N/A 07/01/2015   Procedure: ESOPHAGOGASTRODUODENOSCOPY (EGD) WITH PROPOFOL;  Surgeon: Willis Modena, MD;  Location: [facility];  Service: Endoscopy;  Laterality: N/A;  . ESOPHAGOGASTRODUODENOSCOPY (EGD) WITH PROPOFOL N/A 11/23/2015   Procedure: ESOPHAGOGASTRODUODENOSCOPY (EGD) WITH PROPOFOL;  Surgeon: Willis Modena, MD;  Location: [facility] ENDOSCOPY;  Service: Endoscopy;  Laterality: N/A;  may need to intubate  . EYE SURGERY     growth on right eye removed   . IR GENERIC HISTORICAL  12/13/2015   IR REPLC DUODEN/JEJUNO TUBE PERCUT W/FLUORO 12/13/2015 [name], MD [facility]  . LAPAROSCOPIC GASTROSTOMY N/A 12/01/2015   Procedure: LAPAROSCOPIC PLACEMENT OF FEEDING JEJUNOSTOMY AND GASTROSTOMY TUBE;  Surgeon: Karie Soda, MD;  Location: [facility];  Service: General;  Laterality: N/A;  . LAPAROSCOPY N/A 12/03/2015   Procedure: LAPAROSCOPY DIAGNOSTIC, OMENTOPEXY, JEJUNOSTOMY, WASH OUT;  Surgeon: Karie Soda, MD;  Location: [facility];  Service:  General;  Laterality: N/A;  . STENT TO HEART     2 1997 AND 1 IN 1996  . TONSILLECTOMY    . XI ROBOTIC VAGOTOMY AND ANTRECTOMY N/A 09/25/2015   Procedure: XI ROBOTIC ANTERIOR AND POSTERIOR VAGOTOMY, BILROTH I  ANASTOMOSIS DOR FUNDIPLICATION, OMENTOPEXY  UPPER ENDOSCOPY;  Surgeon: Karie Soda, MD;  Location: [facility];  Service: General;  Laterality: N/A;      Medication List       Accurate as of 01/11/16 11:59 PM. Always use your most recent med list.          aluminum-magnesium hydroxide-simethicone 200-200-20 MG/5ML Susp Commonly known as:  MAALOX Take 30 mLs by mouth every 6 (six) hours as needed.   alum & mag hydroxide-simeth 200-200-20 MG/5ML suspension Commonly known as:  MAALOX/MYLANTA Take 30 mLs by mouth every 6 (six) hours as needed for indigestion or heartburn (or bloating).   aspirin EC 81 MG tablet Take 81 mg by mouth every evening.   aspirin 81 MG chewable tablet Place 81 mg into feeding tube every evening.   atorvastatin 40 MG tablet Commonly known as:  LIPITOR Place 40 mg into feeding tube at bedtime.   bisacodyl 10 MG suppository Commonly known as:  DULCOLAX Place 1 suppository (10 mg total) rectally daily as needed for moderate constipation or severe constipation.   bismuth subsalicylate 262 MG/15ML suspension Commonly known as:  PEPTO BISMOL Place 30 mLs into feeding tube every 8 (eight) hours as needed for indigestion or diarrhea or loose stools.   bismuth subsalicylate 262 MG/15ML suspension Commonly known as:  PEPTO BISMOL Take 30 mLs by mouth every 8 (eight) hours as needed for indigestion or diarrhea or loose stools.   calcium-vitamin D 500-200 MG-UNIT tablet Commonly known as:  OSCAL WITH D Place 1 tablet into feeding tube daily with breakfast.   CENTAMIN Liqd Place 10 mLs into feeding tube daily before breakfast.   MULTIVITAMIN & MINERAL Liqd Place 10 mLs into feeding tube daily before breakfast.   feeding supplement (JEVITY 1.2 CAL) Liqd Place 237 mLs into feeding tube every 4 (four) hours.   feeding supplement (JEVITY 1.2 CAL) Liqd Place 474 mLs into feeding tube 3 (three) times daily.   feeding supplement (PRO-STAT SUGAR FREE 64) Liqd Place 30 mLs into feeding tube daily.   FLUoxetine 20 MG tablet Commonly  known as:  PROZAC Place 20 mg into feeding tube daily.   sterile water for irrigation Irrigate with 250 mLs as directed every 6 (six) hours.   free water Soln Place 300 mLs into feeding tube every 8 (eight) hours.   haloperidol 0.5 MG tablet Commonly known as:  HALDOL Place 0.25 mg into feeding tube daily. At 4 pm   haloperidol 1 MG tablet Commonly known as:  HALDOL Take 1 tablet (1 mg total) by mouth at bedtime as needed and may repeat dose one time if needed for agitation (insomnia).   HYDROcodone-acetaminophen 5-325 MG tablet Commonly known as:  NORCO/VICODIN Place 1-2 tablets into feeding tube every 6 (six) hours as needed for moderate pain or severe pain.   liver oil-zinc oxide 40 % ointment Commonly known as:  DESITIN Apply topically 2 (two) times daily.   loperamide 1 MG/5ML solution Commonly known as:  IMODIUM Place 10 mLs (2 mg total) into feeding tube as needed for diarrhea or loose stools (2mg  per G tube q8h PRN more than 2BMs q8H).   magic mouthwash Soln Take 15 mLs by mouth 4 (four) times daily  as needed for mouth pain (sore throat).   metoCLOPramide 5 MG/5ML solution Commonly known as:  REGLAN Place 10 mg into feeding tube every 6 (six) hours as needed for nausea or vomiting.   metoCLOPramide 5 MG tablet Commonly known as:  REGLAN Take 1-2 tablets (5-10 mg total) by mouth every 6 (six) hours as needed for nausea or vomiting.   metoprolol tartrate 25 MG tablet Commonly known as:  LOPRESSOR Place 0.5 tablets (12.5 mg total) into feeding tube daily.   multivitamin tablet Take 2 tablets by mouth daily with breakfast.   nicotine 7 mg/24hr patch Commonly known as:  NICODERM CQ - dosed in mg/24 hr Place 7 mg onto the skin daily. Rotate site   nitroGLYCERIN 0.4 MG SL tablet Commonly known as:  NITROSTAT Place 0.4 mg under the tongue every 5 (five) minutes as needed for chest pain.   omeprazole 40 MG capsule Commonly known as:  PRILOSEC Take 40 mg by  mouth 2 (two) times daily.   pantoprazole 40 MG tablet Commonly known as:  PROTONIX Take 40 mg by mouth 2 (two) times daily.   REFRESH OP Place 2 drops into both eyes as needed (For dry eyes.).   RESTASIS 0.05 % ophthalmic emulsion Generic drug:  cycloSPORINE Place 1 drop into both eyes 2 (two) times daily.   traMADol 50 MG tablet Commonly known as:  ULTRAM Take 1-2 tablets (50-100 mg total) by mouth every 6 (six) hours as needed for moderate pain or severe pain.   traZODone 50 MG tablet Commonly known as:  DESYREL Place 50 mg into feeding tube at bedtime.   Vitamin B-12 1000 MCG Subl Place 1 tablet under the tongue daily with breakfast.       No orders of the defined types were placed in this encounter.    There is no immunization history on file for this patient.  Social History  Substance Use Topics  . Smoking status: Former Smoker    Packs/day: 2.00    Years: 20.00    Types: Cigarettes    Quit date: 02/15/1995  . Smokeless tobacco: Never Used  . Alcohol use No    Review of Systems  DATA OBTAINED: from patient, nurse, wife GENERAL:  no fevers, fatigue, appetite changes SKIN: No itching, rash HEENT: No complaint RESPIRATORY: No cough, wheezing, SOB CARDIAC: No chest pain, palpitations, lower extremity edema  GI: No abdominal pain, No N/V or constipation,no diarrhea No heartburn or reflux  GU: No dysuria, frequency or urgency, or incontinence  MUSCULOSKELETAL: No unrelieved bone/joint pain NEUROLOGIC: No headache, dizziness  PSYCHIATRIC: No overt anxiety or sadness   Vitals:   01/11/16 1141  BP: 119/73  Pulse: 84  Resp: 20  Temp: 97.4 F (36.3 C)   Body mass index is 21.31 kg/m. Physical Exam  GENERAL APPEARANCE: Alert, conversant, No acute distress  SKIN: No diaphoresis rash HEENT: much stronger voice RESPIRATORY: Breathing is even, unlabored. Lung sounds are slt rhonchi  CARDIOVASCULAR: Heart RRR no murmurs, rubs or gallops. No peripheral  edema  GASTROINTESTINAL: Abdomen is soft, non-tender, not distended w/ normal bowel sounds.  GENITOURINARY: Bladder non tender, not distended  MUSCULOSKELETAL: No abnormal joints or musculature NEUROLOGIC: Cranial nerves 2-12 grossly intact. Moves all extremities PSYCHIATRIC: Mood and affect appropriate to situation, no behavioral issues  Patient Active Problem List   Diagnosis Date Noted  . 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 Vitamin B12 deficiency 01/02/2016  . BPH (benign prostatic hyperplasia)   . Posterior vitreous detachment   .  Salzmann's nodular dystrophy of left eye   . Hyperopia of both eyes with astigmatism and presbyopia   . Central pterygium of right eye   . Pressure injury of skin 12/30/2015  . Hypernatremia 12/28/2015  . Tracheostomy in place [name]) 12/25/2015  . Pneumatosis of intestines 12/11/2015  . Tobacco abuse 12/09/2015  . Ischemic right index finger at tip 12/09/2015  . Gastritis 12/09/2015  . Gastric tube present (LUQ) 12/09/2015  . Jejunostomy tube present (LLQ) 12/09/2015  . Acute respiratory failure with hypoxemia (HCC)   . Protein-calorie malnutrition, severe 12/04/2015  . Encephalopathy acute 12/03/2015  . Bile peritonitis due to gastric tube dislodgement 12/03/2015  . Gastric Ileus  11/25/2015  . Gastric outlet obstruction 11/24/2015  . Pyloric stricture 11/24/2015  . Anxiety state 09/28/2015  . Hyperlipidemia   . 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 Coronary atherosclerosis of native coronary artery 04/24/2013  . Essential hypertension, benign 04/24/2013  . Other and unspecified hyperlipidemia 04/24/2013  . Central pterygium 10/30/2012  . 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 Cataract, nuclear sclerotic senile 10/30/2012    CMP     Component Value Date/Time   NA 153 (H) 12/29/2015 0900   NA 153 (A) 12/29/2015   K 3.8 12/29/2015 0900   CL 123 (H)  12/29/2015 0900   CO2 23 12/29/2015 0900   GLUCOSE 117 (H) 12/29/2015 0900   BUN 63 (H) 12/29/2015 0900   BUN 63 (A) 12/29/2015   CREATININE 1.44 (H) 12/29/2015 0900   CALCIUM 7.5 (L) 12/29/2015 0900   PROT 5.8 (L) 12/28/2015 0419   ALBUMIN 1.6 (L) 12/28/2015 0419   AST 42 (H) 12/28/2015 0419   ALT 51 12/28/2015 0419   ALKPHOS 340 (H) 12/28/2015 0419   BILITOT 0.8 12/28/2015 0419   GFRNONAA 46 (L) 12/29/2015 0900   GFRAA 53 (L) 12/29/2015 0900    Recent Labs  12/21/15 0530  12/24/15 0435  12/27/15 0348 12/28/15 12/28/15 0419 12/29/15 12/29/15 0900  NA 147*  < > 152*  < > 155* 154* 154* 153* 153*  K 3.5  < > 3.8  < > 3.7 3.7 3.7  --  3.8  CL 115*  < > 121*  < > 125*  --  124*  --  123*  CO2 24  < > 23  < > 24  --  24  --  23  GLUCOSE 118*  < > 140*  < > 133*  --  132*  --  117*  BUN 40*  < > 58*  < > 79* 69* 69* 63* 63*  CREATININE 1.06  < > 1.19  < > 1.33* 1.3 1.25* 1.4* 1.44*  CALCIUM 8.4*  < > 8.0*  < > 7.3*  --  7.3*  --  7.5*  MG 2.0  --  2.2  --   --   --  2.1  --   --   PHOS 4.3  --  3.4  --   --   --  2.8  --   --   < > = values in this interval not displayed.  Recent Labs  12/21/15 0530 12/24/15 0435 12/28/15 0419  AST 31 43* 42*  ALT 28 44 51  ALKPHOS 212* 305* 340*  BILITOT 1.4* 1.1 0.8  PROT 6.1* 6.2* 5.8*  ALBUMIN 1.6* 1.6* 1.6*    Recent Labs  12/14/15 0549 12/15/15 0424 12/18/15 1159 12/21/15 0530 12/29/15 12/29/15 0900  WBC  7.7 10.2 10.6* 8.9 15.3 15.3*  NEUTROABS 5.1 7.0  --  6.1  --   --   HGB 10.3* 8.8* 8.6* 8.4*  --  7.8*  HCT 31.0* 26.4* 26.0* 25.9*  --  24.7*  MCV 88.8 89.2 87.5 89.0  --  89.8  PLT 516* 633* 573* 473*  --  509*    Recent Labs  12/14/15 0549 12/21/15 0530 01/11/16  TRIG 194* 199* 177*   No results found for: MICROALBUR No results found for: TSH No results found for: HGBA1C Lab Results  Component Value Date   TRIG 177 (A) 01/11/2016    Significant Diagnostic Results in last 30 days:  Dg Abd 1  View  Result Date: 12/18/2015 CLINICAL DATA:  Jejunostomy tube replacement. EXAM: ABDOMEN - 1 VIEW COMPARISON:  Procedural spot fluoroscopic 12/13/2015. CT abdomen and pelvis 12/10/2015. FINDINGS: Percutaneous jejunostomy tube overlies the left mid abdomen. Injected contrast opacifies an adjacent small bowel loop. A small amount of contrast is partially visualized in the left upper quadrant in the expected region of the GE junction and may be within the distal esophagus and stomach. Abdominal surgical clips are present. Gas is present in grossly nondilated loops of small and large bowel to the level of the rectum without evidence of obstruction. IMPRESSION: Satisfactory positioning of percutaneous jejunostomy tube based on appearance of injected contrast material. Electronically Signed   By: Sebastian Ache M.D.   On: 12/18/2015 10:13   Dg Swallowing Func-speech Pathology  Result Date: 12/29/2015 Objective Swallowing Evaluation: Type of Study: MBS-Modified Barium Swallow Study Patient Details Name: ABDULAZIZ TOMAN MRN: [id number] Date of Birth: [birth year] Today's Date: 12/29/2015 Time: SLP Start Time (ACUTE ONLY): 0905-SLP Stop Time (ACUTE ONLY): 0940 SLP Time Calculation (min) (ACUTE ONLY): 35 min Past Medical History: Past Medical History: Diagnosis Date . Bilateral dry eyes  . Coronary artery disease 1997  bare mental stent right coronart artery . Degenerative disc disease   LOWER BACK . Dysphonia  . ED (erectile dysfunction)  . Gastroesophageal reflux disease  . Hyperlipidemia  . Hypertension  . Peptic ulcer  . Pneumonia   HISTORY OF IN CHILDHOOD . Posterior vitreous detachment 10/30/2012 . Urinary hesitancy  . Wears glasses  Past Surgical History: Past Surgical History: Procedure Laterality Date . CARDIAC CATHETERIZATION   . CHOLECYSTECTOMY   . ESOPHAGOGASTRODUODENOSCOPY N/A 12/01/2015  Procedure: ESOPHAGOGASTRODUODENOSCOPY (EGD) BALLOON DILATION OF DUODENAL STRICTURE;  Surgeon: Karie Soda, MD;  Location:  [facility];  Service: General;  Laterality: N/A; . ESOPHAGOGASTRODUODENOSCOPY N/A 12/14/2015  Procedure: ESOPHAGOGASTRODUODENOSCOPY (EGD);  Surgeon: Vida Rigger, MD;  Location: [facility];  Service: Endoscopy;  Laterality: N/A;  Patient has tracheostomy . ESOPHAGOGASTRODUODENOSCOPY (EGD) WITH PROPOFOL N/A 07/01/2015  Procedure: ESOPHAGOGASTRODUODENOSCOPY (EGD) WITH PROPOFOL;  Surgeon: Willis Modena, MD;  Location: [facility];  Service: Endoscopy;  Laterality: N/A; . ESOPHAGOGASTRODUODENOSCOPY (EGD) WITH PROPOFOL N/A 11/23/2015  Procedure: ESOPHAGOGASTRODUODENOSCOPY (EGD) WITH PROPOFOL;  Surgeon: Willis Modena, MD;  Location: [facility] ENDOSCOPY;  Service: Endoscopy;  Laterality: N/A;  may need to intubate . EYE SURGERY    growth on right eye removed  . IR GENERIC HISTORICAL  12/13/2015  IR REPLC DUODEN/JEJUNO TUBE PERCUT W/FLUORO 12/13/2015 [name], MD [facility]INTER[facility] RAD . LAPAROSCOPIC GASTROSTOMY N/A 12/01/2015  Procedure: LAPAROSCOPIC PLACEMENT OF FEEDING JEJUNOSTOMY AND GASTROSTOMY TUBE;  Surgeon: Karie Soda, MD;  Location: [facility];  Service: General;  Laterality: N/A; . LAPAROSCOPY N/A 12/03/2015  Procedure: LAPAROSCOPY DIAGNOSTIC, OMENTOPEXY, JEJUNOSTOMY, WASH OUT;  Surgeon: Karie Soda, MD;  Location: [facility];  Service:  General;  Laterality: N/A; . STENT TO HEART    2 1997 AND 1 IN 1996 . TONSILLECTOMY   . XI ROBOTIC VAGOTOMY AND ANTRECTOMY N/A 09/25/2015  Procedure: XI ROBOTIC ANTERIOR AND POSTERIOR VAGOTOMY, BILROTH I  ANASTOMOSIS DOR FUNDIPLICATION, OMENTOPEXY  UPPER ENDOSCOPY;  Surgeon: Karie Soda, MD;  Location: [facility];  Service: General;  Laterality: N/A; HPI: 75 yo male former smoker with progressive epigastric pain, nausea, vomiting, bloating from gastric outlet obstruction with pyloric channel ulcer.  Had Billroth 1, vagotomy, fundoplication, omentopexy August 2017 with initial improvement.  Had recurrent symptoms September 2017 that become progressively worse with gastric dilation from recurrent stricture.   Had laparoscopic placement of jejunostomy tube, gastrostomy tube, and EGD balloon dilation of duodenal stricture 10/17.  Developed gastric leakage with peritonitis after pulling out PEG tube 10/19 and taken to OR. On 10/27 found 2 new LUQ abscesses/fluid collections which were drained percutaneously by IR. Had perc trach 10/27.  As of 10/30 his mental status has improved.  Pt underwent MBS and has been on TPN since due to level of dysphagia.  Ice chips have been consumed with improved tolerance.  MD ordered repeat MBS.   Subjective: pt awake in bed, weak Assessment / Plan / Recommendation CHL IP CLINICAL IMPRESSIONS 12/29/2015 Therapy Diagnosis Moderate oral phase dysphagia;Moderate pharyngeal phase dysphagia;Moderate cervical esophageal phase dysphagia Clinical Impression Patient continues with moderate oropharyngeal dysphagia that appears marginally worse than prior MBS 12/17/15 results.  Swallow function continues to be significantly weak resulting in residuals and aspiration with poor cough mechanism.   Pt again fatigued during MBS and at times had difficulty keeping his eyes open.   Viscous green tinged secretions coughed through trach (blew off PMSV) after oral care and before barium administration.  Pt also with copious secretions coughed AROUND trach following MBS *pt had PMSV in place.   Gross weakness secretion retention that mixed with barium without consistent awareness.  Delayed oral transiting, lingual pumping and premature spillage of barium into pharynx noted due to weakness.  Compromised tongue base retraction and laryngeal elevation allowed laryngeal penetration and SILENT aspiration both during and after the swallow.  Cues to cough/throat clear and dry swallow did not fully clear penetrates and patient quickly re-penetrated pharyngeal residuals.  In addition patient appeared with decreased proximal esophageal opening/decreased CP opening resulting in residuals in pyriform sinus.  Unfortunately  patient is at high risk of aspiration with intake at this time.  Using teach back/live video educated patient and daughter to findings and reinforced effective compensation strategies.   Recommend continue NPO except single ice chips with strict precautions - oral care, pmsv in place and cueing patient to cough/clear throat and re-swallow.  Pt also with increase in delayed responses to SLP instructions/cues today- and states "I'm weak".  Given pt's lack of swallow improvement, SlP is concerned for swallow prognosis during acute stay.  Pt will need repeat MBS in future due to silent nature of dysphagia. Will follow up for dysphagia treatment/management.   Thanks for allowing me to help care for this patient.  Impact on safety and function Severe aspiration risk;Risk for inadequate nutrition/hydration   CHL IP TREATMENT RECOMMENDATION 12/29/2015 Treatment Recommendations F/U MBS in --- days (Comment)   Prognosis 12/29/2015 Prognosis for Safe Diet Advancement Guarded Barriers to Reach Goals Severity of deficits;Cognitive deficits Barriers/Prognosis Comment -- CHL IP DIET RECOMMENDATION 12/29/2015 SLP Diet Recommendations NPO;Ice chips PRN after oral care Liquid Administration via -- Medication Administration Via alternative means Compensations Minimize environmental distractions;Slow  rate;Multiple dry swallows after each bite/sip;Hard cough after swallow Postural Changes --   CHL IP OTHER RECOMMENDATIONS 12/29/2015 Recommended Consults -- Oral Care Recommendations Oral care QID Other Recommendations --   CHL IP FOLLOW UP RECOMMENDATIONS 12/29/2015 Follow up Recommendations Skilled Nursing facility;Inpatient Rehab   CHL IP FREQUENCY AND DURATION 12/29/2015 Speech Therapy Frequency (ACUTE ONLY) min 2x/week Treatment Duration 2 weeks      CHL IP ORAL PHASE 12/29/2015 Oral Phase Impaired Oral - Pudding Teaspoon -- Oral - Pudding Cup -- Oral - Honey Teaspoon -- Oral - Honey Cup -- Oral - Nectar Teaspoon -- Oral - Nectar Cup  -- Oral - Nectar Straw -- Oral - Thin Teaspoon Weak lingual manipulation;Reduced posterior propulsion;Lingual/palatal residue;Decreased bolus cohesion;Delayed oral transit;Premature spillage Oral - Thin Cup NT Oral - Thin Straw NT Oral - Puree Weak lingual manipulation;Lingual/palatal residue;Reduced posterior propulsion;Premature spillage;Decreased bolus cohesion;Delayed oral transit Oral - Mech Soft -- Oral - Regular -- Oral - Multi-Consistency -- Oral - Pill -- Oral Phase - Comment --  CHL IP PHARYNGEAL PHASE 12/29/2015 Pharyngeal Phase Impaired Pharyngeal- Pudding Teaspoon -- Pharyngeal -- Pharyngeal- Pudding Cup -- Pharyngeal -- Pharyngeal- Honey Teaspoon -- Pharyngeal -- Pharyngeal- Honey Cup -- Pharyngeal -- Pharyngeal- Nectar Teaspoon Reduced pharyngeal peristalsis;Reduced epiglottic inversion;Reduced anterior laryngeal mobility;Reduced laryngeal elevation;Reduced airway/laryngeal closure;Reduced tongue base retraction;Penetration/Aspiration during swallow;Pharyngeal residue - valleculae;Lateral channel residue;Pharyngeal residue - cp segment Pharyngeal Material enters airway, remains ABOVE vocal cords and not ejected out Pharyngeal- Nectar Cup Delayed swallow initiation-pyriform sinuses;Reduced pharyngeal peristalsis;Reduced epiglottic inversion;Reduced anterior laryngeal mobility;Reduced laryngeal elevation;Reduced airway/laryngeal closure;Reduced tongue base retraction;Penetration/Aspiration during swallow;Penetration/Apiration after swallow;Significant aspiration (Amount);Pharyngeal residue - valleculae;Pharyngeal residue - pyriform;Lateral channel residue;Pharyngeal residue - cp segment Pharyngeal Material enters airway, passes BELOW cords without attempt by patient to eject out (silent aspiration);Material enters airway, passes BELOW cords and not ejected out despite cough attempt by patient Pharyngeal- Nectar Straw -- Pharyngeal -- Pharyngeal- Thin Teaspoon Delayed swallow initiation-vallecula;Reduced  pharyngeal peristalsis;Reduced epiglottic inversion;Reduced anterior laryngeal mobility;Reduced laryngeal elevation;Reduced airway/laryngeal closure;Reduced tongue base retraction;Penetration/Aspiration during swallow;Penetration/Aspiration before swallow;Pharyngeal residue - valleculae;Lateral channel residue;Trace aspiration;Pharyngeal residue - cp segment Pharyngeal Material enters airway, passes BELOW cords without attempt by patient to eject out (silent aspiration) Pharyngeal- Thin Cup NT Pharyngeal -- Pharyngeal- Thin Straw NT Pharyngeal -- Pharyngeal- Puree Delayed swallow initiation-vallecula;Reduced epiglottic inversion;Reduced pharyngeal peristalsis;Reduced anterior laryngeal mobility;Reduced laryngeal elevation;Reduced airway/laryngeal closure;Reduced tongue base retraction;Pharyngeal residue - valleculae;Pharyngeal residue - pyriform Pharyngeal -- Pharyngeal- Mechanical Soft -- Pharyngeal -- Pharyngeal- Regular -- Pharyngeal -- Pharyngeal- Multi-consistency -- Pharyngeal -- Pharyngeal- Pill -- Pharyngeal -- Pharyngeal Comment pt continues with gross residuals and poor awareness, WEAK dry swallow/throat clear and cough were ineffective to fully clear aspiration/penetration/residuals, pt did have viscous colored secretions *green tinged* coughed and expectorated from trach prior to MBS and around the trach AFTER   CHL IP CERVICAL ESOPHAGEAL PHASE 12/29/2015 Cervical Esophageal Phase -- Pudding Teaspoon -- Pudding Cup -- Honey Teaspoon -- Honey Cup -- Nectar Teaspoon Prominent cricopharyngeal segment;Reduced cricopharyngeal relaxation Nectar Cup Prominent cricopharyngeal segment;Reduced cricopharyngeal relaxation Nectar Straw -- Thin Teaspoon Reduced cricopharyngeal relaxation;Prominent cricopharyngeal segment Thin Cup NT Thin Straw NT Puree Reduced cricopharyngeal relaxation;Prominent cricopharyngeal segment Mechanical Soft -- Regular -- Multi-consistency -- Pill -- Cervical Esophageal Comment -- No  flowsheet data found. Donavan Burnetamara Kimball, MS [facility] S[facility] [phone]              Dg Swallowing Func-speech Pathology  Result Date: 12/17/2015 Objective Swallowing Evaluation: Type of Study: MBS-Modified Barium Swallow Study Patient Details Name: Ardyth GalRalph T Verbeek MRN:  [id number] Date of Birth: Oct 11, 1940 Today's Date: 12/17/2015 Time: SLP Start Time (ACUTE ONLY): 0925-SLP Stop Time (ACUTE ONLY): 0945 SLP Time Calculation (min) (ACUTE ONLY): 20 min Past Medical History: Past Medical History: Diagnosis Date . Bilateral dry eyes  . Coronary artery disease 1997  bare mental stent right coronart artery . Degenerative disc disease   LOWER BACK . Dysphonia  . ED (erectile dysfunction)  . Gastroesophageal reflux disease  . Hyperlipidemia  . Hypertension  . Peptic ulcer  . Pneumonia   HISTORY OF IN CHILDHOOD . Posterior vitreous detachment 10/30/2012 . Urinary hesitancy  . Wears glasses  Past Surgical History: Past Surgical History: Procedure Laterality Date . CARDIAC CATHETERIZATION   . CHOLECYSTECTOMY   . ESOPHAGOGASTRODUODENOSCOPY N/A 12/01/2015  Procedure: ESOPHAGOGASTRODUODENOSCOPY (EGD) BALLOON DILATION OF DUODENAL STRICTURE;  Surgeon: Karie Soda, MD;  Location: [facility];  Service: General;  Laterality: N/A; . ESOPHAGOGASTRODUODENOSCOPY N/A 12/14/2015  Procedure: ESOPHAGOGASTRODUODENOSCOPY (EGD);  Surgeon: Vida Rigger, MD;  Location: [facility];  Service: Endoscopy;  Laterality: N/A;  Patient has tracheostomy . ESOPHAGOGASTRODUODENOSCOPY (EGD) WITH PROPOFOL N/A 07/01/2015  Procedure: ESOPHAGOGASTRODUODENOSCOPY (EGD) WITH PROPOFOL;  Surgeon: Willis Modena, MD;  Location: [facility];  Service: Endoscopy;  Laterality: N/A; . ESOPHAGOGASTRODUODENOSCOPY (EGD) WITH PROPOFOL N/A 11/23/2015  Procedure: ESOPHAGOGASTRODUODENOSCOPY (EGD) WITH PROPOFOL;  Surgeon: Willis Modena, MD;  Location[facility];  Service: Endoscopy;  Laterality: N/A;  may need to intubate . EYE SURGERY    growth on right eye removed  . IR GENERIC HISTORICAL   12/13/2015  IR REPLC DUODEN/JEJUNO TUBE PERCUT W/FLUORO 12/13/2015 [name], MD [facility]-INTER[facility] RAD . LAPAROSCOPIC GASTROSTOMY N/A 12/01/2015  Procedure: LAPAROSCOPIC PLACEMENT OF FEEDING JEJUNOSTOMY AND GASTROSTOMY TUBE;  Surgeon: Karie Soda, MD;  Location: [facility];  Service: General;  Laterality: N/A; . LAPAROSCOPY N/A 12/03/2015  Procedure: LAPAROSCOPY DIAGNOSTIC, OMENTOPEXY, JEJUNOSTOMY, WASH OUT;  Surgeon: Karie Soda, MD;  Location: [facility];  Service: General;  Laterality: N/A; . STENT TO HEART    2 1997 AND 1 IN 1996 . TONSILLECTOMY   . XI ROBOTIC VAGOTOMY AND ANTRECTOMY N/A 09/25/2015  Procedure: XI ROBOTIC ANTERIOR AND POSTERIOR VAGOTOMY, BILROTH I  ANASTOMOSIS DOR FUNDIPLICATION, OMENTOPEXY  UPPER ENDOSCOPY;  Surgeon: Karie Soda, MD;  Location: [facility];  Service: General;  Laterality: N/A; HPI: 75 yo male former smoker with progressive epigastric pain, nausea, vomiting, bloating from gastric outlet obstruction with pyloric channel ulcer.  Had Billroth 1, vagotomy, fundoplication, omentopexy August 2017 with initial improvement.  Had recurrent symptoms September 2017 that become progressively worse with gastric dilation from recurrent stricture.  Had laparoscopic placement of jejunostomy tube, gastrostomy tube, and EGD balloon dilation of duodenal stricture 10/17.  Developed gastric leakage with peritonitis after pulling out PEG tube 10/19 and taken to OR. On 10/27 found 2 new LUQ abscesses/fluid collections which were drained percutaneously by IR. Had perc trach 10/27.  As of 10/30 his mental status has improved, he has remained off of vasopressors for several days and he has done well with weaning off the ventilator per MD note.  Subjective: pt awake in bed Assessment / Plan / Recommendation CHL IP CLINICAL IMPRESSIONS 12/17/2015 Therapy Diagnosis Moderate oral phase dysphagia;Moderate pharyngeal phase dysphagia;Moderate cervical esophageal phase dysphagia Clinical Impression Patient presents with moderate  oropharyngeal dysphagia characterized by significant weakness resulting in residuals and aspiration.  Pt was only given minimal amount of thin and nectar barium during testing.  Of note, after patient was transferred to flouro chair, he demonstrated copious coughing.  He cleared scant secretions through trach tube  at that time.  Transferring to chair and coughing episode significantly fatigued patient.  PMSV was placed for MBS with good tolerance during testing.  Unfortunately patient is grossly weak resulting in oropharyngeal deficits and secretion retention that mixed with barium.  Delayed oral transiting, lingual pumping and premature spillage of barium into pharynx noted due to weakness.  Compromised tongue base retraction and laryngeal elevation allowed laryngeal penetration and SILENT aspiration both during and after the swallow.  Cues to cough/throat clear and dry swallow did not fully clear penetrates and patient quickly re-penetrated pharyngeal residuals.  In addition patient appeared with decreased proximal esophageal opening/decreased CP opening resulting in residuals in pyriform sinus.  Unfortunately patient is at high risk of aspiration with intake at this time.  Using teach back/live video educated patient to findings and reinforced effective compensation strategies.   Recommend continue NPO except single ice chips with strict precautions - oral care, pmsv in place and cueing patient to cough/clear throat and re-swallow.  Pt will need repeat MBS in future due to silent nature of dysphagia. Will follow up for dysphagia treatment/management.   Thanks for allowing me to help care for this patient.  Impact on safety and function Severe aspiration risk;Risk for inadequate nutrition/hydration   CHL IP TREATMENT RECOMMENDATION 12/17/2015 Treatment Recommendations F/U MBS in --- days (Comment)   Prognosis 12/17/2015 Prognosis for Safe Diet Advancement Fair Barriers to Reach Goals Severity of deficits;Cognitive  deficits Barriers/Prognosis Comment -- CHL IP DIET RECOMMENDATION 12/17/2015 SLP Diet Recommendations NPO;Ice chips PRN after oral care Liquid Administration via -- Medication Administration Via alternative means Compensations Minimize environmental distractions;Slow rate;Multiple dry swallows after each bite/sip;Hard cough after swallow Postural Changes --   CHL IP OTHER RECOMMENDATIONS 12/17/2015 Recommended Consults -- Oral Care Recommendations Oral care QID Other Recommendations --   CHL IP FOLLOW UP RECOMMENDATIONS 12/17/2015 Follow up Recommendations Skilled Nursing facility;Inpatient Rehab   CHL IP FREQUENCY AND DURATION 12/17/2015 Speech Therapy Frequency (ACUTE ONLY) min 2x/week Treatment Duration 2 weeks      CHL IP ORAL PHASE 12/17/2015 Oral Phase Impaired Oral - Pudding Teaspoon -- Oral - Pudding Cup -- Oral - Honey Teaspoon -- Oral - Honey Cup -- Oral - Nectar Teaspoon -- Oral - Nectar Cup Weak lingual manipulation;Reduced posterior propulsion;Delayed oral transit;Decreased bolus cohesion;Lingual pumping Oral - Nectar Straw -- Oral - Thin Teaspoon Weak lingual manipulation;Delayed oral transit;Decreased bolus cohesion;Reduced posterior propulsion;Lingual pumping Oral - Thin Cup Weak lingual manipulation;Reduced posterior propulsion;Delayed oral transit;Decreased bolus cohesion;Lingual pumping Oral - Thin Straw Weak lingual manipulation;Decreased bolus cohesion;Delayed oral transit;Reduced posterior propulsion;Lingual pumping Oral - Puree -- Oral - Mech Soft -- Oral - Regular -- Oral - Multi-Consistency -- Oral - Pill -- Oral Phase - Comment --  CHL IP PHARYNGEAL PHASE 12/17/2015 Pharyngeal Phase Impaired Pharyngeal- Pudding Teaspoon -- Pharyngeal -- Pharyngeal- Pudding Cup -- Pharyngeal -- Pharyngeal- Honey Teaspoon -- Pharyngeal -- Pharyngeal- Honey Cup -- Pharyngeal -- Pharyngeal- Nectar Teaspoon Reduced laryngeal elevation;Pharyngeal residue - pyriform;Pharyngeal residue - valleculae;Reduced tongue base  retraction;Reduced airway/laryngeal closure;Reduced epiglottic inversion;Penetration/Aspiration during swallow Pharyngeal Material enters airway, passes BELOW cords without attempt by patient to eject out (silent aspiration) Pharyngeal- Nectar Cup Reduced laryngeal elevation;Reduced tongue base retraction;Reduced anterior laryngeal mobility;Reduced epiglottic inversion;Reduced airway/laryngeal closure;Penetration/Aspiration during swallow Pharyngeal Material enters airway, passes BELOW cords without attempt by patient to eject out (silent aspiration) Pharyngeal- Nectar Straw -- Pharyngeal -- Pharyngeal- Thin Teaspoon Reduced laryngeal elevation;Reduced airway/laryngeal closure;Reduced tongue base retraction;Penetration/Aspiration during swallow;Reduced epiglottic inversion;Trace aspiration;Pharyngeal residue - valleculae;Pharyngeal residue - pyriform;Pharyngeal  residue - cp segment Pharyngeal Material enters airway, passes BELOW cords without attempt by patient to eject out (silent aspiration) Pharyngeal- Thin Cup Moderate aspiration;Penetration/Aspiration during swallow;Penetration/Apiration after swallow;Reduced pharyngeal peristalsis;Reduced epiglottic inversion;Reduced laryngeal elevation;Reduced airway/laryngeal closure;Reduced tongue base retraction Pharyngeal Material enters airway, passes BELOW cords without attempt by patient to eject out (silent aspiration) Pharyngeal- Thin Straw Reduced pharyngeal peristalsis;Reduced epiglottic inversion;Reduced anterior laryngeal mobility;Reduced laryngeal elevation;Reduced airway/laryngeal closure;Pharyngeal residue - valleculae;Pharyngeal residue - pyriform;Moderate aspiration;Penetration/Aspiration during swallow Pharyngeal Material enters airway, passes BELOW cords without attempt by patient to eject out (silent aspiration) Pharyngeal- Puree -- Pharyngeal -- Pharyngeal- Mechanical Soft -- Pharyngeal -- Pharyngeal- Regular -- Pharyngeal -- Pharyngeal-  Multi-consistency -- Pharyngeal -- Pharyngeal- Pill -- Pharyngeal -- Pharyngeal Comment pt without awareness to residuals, penetration and aspiration did not fully clear despite cued cough, pt also with aspiration of secretions mixed with barium  CHL IP CERVICAL ESOPHAGEAL PHASE 12/17/2015 Cervical Esophageal Phase Impaired Pudding Teaspoon -- Pudding Cup -- Honey Teaspoon -- Honey Cup -- Nectar Teaspoon Reduced cricopharyngeal relaxation;Prominent cricopharyngeal segment Nectar Cup Reduced cricopharyngeal relaxation;Prominent cricopharyngeal segment Nectar Straw -- Thin Teaspoon Reduced cricopharyngeal relaxation;Prominent cricopharyngeal segment Thin Cup Reduced cricopharyngeal relaxation;Prominent cricopharyngeal segment Thin Straw Reduced cricopharyngeal relaxation;Prominent cricopharyngeal segment Puree -- Mechanical Soft -- Regular -- Multi-consistency -- Pill -- Cervical Esophageal Comment pt appeared with tight UES  Donavan Burnet, MS [facility] SLP [phone]               Assessment and Plan  HYPERNATREMIA - 11/22- 162; 11/23- 160; 11/24- 150; 11/25 - 144; 11/26- 143 and today 11/27- 139  With K+ 4.0, cl -112. CO2 -16; BUN 25 , Cr 0.93; pt was calculated to have a free water deficit of 7 liters; prob 2/2 not enough free water in the face if diarrhea with every tube feed;  water was administered via 1/2 NS IVF and via PEG tube; pt did receive 500 cc D5W once (but D5W is hard to administer ina SNF 2/2 difficulty of getting quick and frequent labs); K+ was administered via G tube, orders for water and K+ were given on a daily basis; pt is back to normal except for continued acidosis which is being followed  PNA- pt's CXR showed a L basilar infiltrate; on call ordered avelox which I changed to levaquin because I felt pt's PNA was aspiration; I did not use Augmentin because pt had had had so much diarrhea; pt was renally dosed with levaquin 750 mg q48 hours for 3 doses; pt's trach aspirate went from green to  yellow over night and pt has very few secretions today  TPN - going well; the concurrent starting of TPN at same time pt was hypernatremic was tricky; pt looks much stronger physically    Labs/tests ordered: BMP daily  Time spent >> 35 min;> 50% of time with patient was spent reviewing records, labs, tests and studies, counseling and developing plan of care  Thurston Hole D. [name], MD

## 2016-01-12 ENCOUNTER — Encounter: Payer: Self-pay | Admitting: Internal Medicine

## 2016-01-12 ENCOUNTER — Non-Acute Institutional Stay (SKILLED_NURSING_FACILITY): Payer: Medicare Other | Admitting: Internal Medicine

## 2016-01-12 DIAGNOSIS — E870 Hyperosmolality and hypernatremia: Secondary | ICD-10-CM | POA: Diagnosis not present

## 2016-01-12 DIAGNOSIS — E872 Acidosis, unspecified: Secondary | ICD-10-CM

## 2016-01-12 DIAGNOSIS — R531 Weakness: Secondary | ICD-10-CM

## 2016-01-12 DIAGNOSIS — Z931 Gastrostomy status: Secondary | ICD-10-CM

## 2016-01-12 NOTE — Progress Notes (Signed)
Location:  [facility] Room Number: [phone] Place of Service:  SNF [phone])  [name]. [name], MD  Patient Care Team: [name], MD as PCP - General (Internal Medicine) Karie Soda, MD as Consulting Physician (General Surgery) Willis Modena, MD as Consulting Physician (Gastroenterology) Coralyn Helling, MD as Consulting Physician (Pulmonary Disease)  Extended Emergency Contact Information Primary Emergency Contact: [name] Address: [address]          [place], [place] [address] [name] Home Phone: [phone] Mobile Phone: [phone] Relation: Spouse Secondary Emergency Contact: [name] [place] of [place] Mobile Phone: [phone] Relation: Daughter    Allergies: Flomax [tamsulosin hcl]; Lisinopril; Lorazepam; and Uroxatral [alfuzosin hcl er]  Chief Complaint  Patient presents with  . Acute Visit    Acute    HPI: Patient is 75 y.o. male who is being followed up on his continued TPN, his just resolved hypernatremia and plans to restart tube feedings. jevity was causing large volume diarrhea with each feeding, it was stopped when pt became hypernatremic so I have asked dietary if there is a solution  less likely to cause diarrhea. Pt is also recovering from PNA.  Past Medical History:  Diagnosis Date  . Bilateral dry eyes   . BPH (benign prostatic hyperplasia)   . Central pterygium of right eye   . Coronary artery disease 1997, 2009   bare mental stent right coronart artery, occlusive followed by Dr. Garnette Scheuermann in [place]  . Degenerative disc disease    LOWER BACK  . Dysphonia   . ED (erectile dysfunction)   . Gastroesophageal reflux disease   . Hyperlipidemia   . Hyperopia of both eyes with astigmatism and presbyopia   . Hypertension   . Nuclear senile cataract    [facility]. [facility] in [place]  . Peptic ulcer   . Pneumonia    HISTORY OF IN CHILDHOOD  . Posterior vitreous  detachment 10/30/2012  . Salzmann's nodular dystrophy of left eye   . Urinary hesitancy   . Wears glasses     Past Surgical History:  Procedure Laterality Date  . CARDIAC CATHETERIZATION    . CHOLECYSTECTOMY    . ESOPHAGOGASTRODUODENOSCOPY N/A 12/01/2015   Procedure: ESOPHAGOGASTRODUODENOSCOPY (EGD) BALLOON DILATION OF DUODENAL STRICTURE;  Surgeon: Karie Soda, MD;  Location: [facility];  Service: General;  Laterality: N/A;  . ESOPHAGOGASTRODUODENOSCOPY N/A 12/14/2015   Procedure: ESOPHAGOGASTRODUODENOSCOPY (EGD);  Surgeon: Vida Rigger, MD;  Location: [facility];  Service: Endoscopy;  Laterality: N/A;  Patient has tracheostomy  . ESOPHAGOGASTRODUODENOSCOPY (EGD) WITH PROPOFOL N/A 07/01/2015   Procedure: ESOPHAGOGASTRODUODENOSCOPY (EGD) WITH PROPOFOL;  Surgeon: Willis Modena, MD;  Location: [facility];  Service: Endoscopy;  Laterality: N/A;  . ESOPHAGOGASTRODUODENOSCOPY (EGD) WITH PROPOFOL N/A 11/23/2015   Procedure: ESOPHAGOGASTRODUODENOSCOPY (EGD) WITH PROPOFOL;  Surgeon: Willis Modena, MD;  Location[facility]COPY;  Service: Endoscopy;  Laterality: N/A;  may need to intubate  . EYE SURGERY     growth on right eye removed   . IR GENERIC HISTORICAL  12/13/2015   IR REPLC DUODEN/JEJUNO TUBE PERCUT W/FLUORO 12/13/2015 [name], MD [facility]  . LAPAROSCOPIC GASTROSTOMY N/A 12/01/2015   Procedure: LAPAROSCOPIC PLACEMENT OF FEEDING JEJUNOSTOMY AND GASTROSTOMY TUBE;  Surgeon: Karie Soda, MD;  Location: [facility];  Service: General;  Laterality: N/A;  . LAPAROSCOPY N/A 12/03/2015   Procedure: LAPAROSCOPY DIAGNOSTIC, OMENTOPEXY, JEJUNOSTOMY, WASH OUT;  Surgeon: Karie Soda, MD;  Location: [facility];  Service: General;  Laterality: N/A;  .  STENT TO HEART     2 1997 AND 1 IN 1996  . TONSILLECTOMY    . XI ROBOTIC VAGOTOMY AND ANTRECTOMY N/A 09/25/2015   Procedure: XI ROBOTIC ANTERIOR AND POSTERIOR VAGOTOMY, BILROTH I  ANASTOMOSIS DOR FUNDIPLICATION, OMENTOPEXY  UPPER ENDOSCOPY;  Surgeon: Karie Soda, MD;  Location: [facility];  Service: General;  Laterality: N/A;      Medication List       Accurate as of 01/12/16  3:08 PM. Always use your most recent med list.          aluminum-magnesium hydroxide-simethicone 200-200-20 MG/5ML Susp Commonly known as:  MAALOX Take 30 mLs by mouth every 6 (six) hours as needed.   aspirin 81 MG chewable tablet Place 81 mg into feeding tube every evening.   atorvastatin 40 MG tablet Commonly known as:  LIPITOR Place 40 mg into feeding tube at bedtime.   bismuth subsalicylate 262 MG/15ML suspension Commonly known as:  PEPTO BISMOL Place 30 mLs into feeding tube every 8 (eight) hours as needed for indigestion or diarrhea or loose stools.   calcium-vitamin D 500-200 MG-UNIT tablet Commonly known as:  OSCAL WITH D Place 1 tablet into feeding tube daily with breakfast.   CENTAMIN Liqd Place 10 mLs into feeding tube daily before breakfast.   MULTIVITAMIN & MINERAL Liqd Place 10 mLs into feeding tube daily before breakfast.   feeding supplement (JEVITY 1.2 CAL) Liqd Place 237 mLs into feeding tube every 4 (four) hours.   feeding supplement (PRO-STAT SUGAR FREE 64) Liqd Place 30 mLs into feeding tube daily.   FLUoxetine 20 MG tablet Commonly known as:  PROZAC Place 20 mg into feeding tube daily.   haloperidol 0.5 MG tablet Commonly known as:  HALDOL Place 0.25 mg into feeding tube daily. At 4 pm   HYDROcodone-acetaminophen 5-325 MG tablet Commonly known as:  NORCO/VICODIN Place 1-2 tablets into feeding tube every 6 (six) hours as needed for moderate pain or severe pain.   loperamide 1 MG/5ML solution Commonly known as:  IMODIUM Place 10 mLs (2 mg total) into feeding tube as needed for diarrhea or loose stools (2mg  per G tube q8h PRN more than 2BMs q8H).   magic mouthwash Soln Take 15 mLs by mouth 4 (four) times daily as needed for mouth pain (sore throat).   metoCLOPramide 5 MG/5ML solution Commonly known as:  REGLAN Place  10 mg into feeding tube every 6 (six) hours as needed for nausea or vomiting.   metoprolol tartrate 25 MG tablet Commonly known as:  LOPRESSOR Place 0.5 tablets (12.5 mg total) into feeding tube daily.   nicotine 7 mg/24hr patch Commonly known as:  NICODERM CQ - dosed in mg/24 hr Place 7 mg onto the skin daily. Rotate site   nitroGLYCERIN 0.4 MG SL tablet Commonly known as:  NITROSTAT Place 0.4 mg under the tongue every 5 (five) minutes as needed for chest pain.   omeprazole 40 MG capsule Commonly known as:  PRILOSEC Take 40 mg by mouth 2 (two) times daily.   REFRESH OP Place 2 drops into both eyes as needed (For dry eyes.).   RESTASIS 0.05 % ophthalmic emulsion Generic drug:  cycloSPORINE Place 1 drop into both eyes 2 (two) times daily.   sterile water for irrigation Irrigate with 250 mLs as directed every 6 (six) hours.   traZODone 50 MG tablet Commonly known as:  DESYREL Place 50 mg into feeding tube at bedtime.   Vitamin B-12 1000 MCG Subl Place 1 tablet  under the tongue daily with breakfast.       Meds ordered this encounter  Medications  . omeprazole (PRILOSEC) 40 MG capsule    Sig: Take 40 mg by mouth 2 (two) times daily.  . traZODone (DESYREL) 50 MG tablet    Sig: Place 50 mg into feeding tube at bedtime.  [name] FLUoxetine (PROZAC) 20 MG tablet    Sig: Place 20 mg into feeding tube daily.  [name] bismuth subsalicylate (PEPTO BISMOL) 262 MG/15ML suspension    Sig: Place 30 mLs into feeding tube every 8 (eight) hours as needed for indigestion or diarrhea or loose stools.  . metoCLOPramide (REGLAN) 5 MG/5ML solution    Sig: Place 10 mg into feeding tube every 6 (six) hours as needed for nausea or vomiting.  [name] aluminum-magnesium hydroxide-simethicone (MAALOX) 200-200-20 MG/5ML SUSP    Sig: Take 30 mLs by mouth every 6 (six) hours as needed.  [name] HYDROcodone-acetaminophen (NORCO/VICODIN) 5-325 MG tablet    Sig: Place 1-2 tablets into feeding tube every 6 (six) hours as  needed for moderate pain or severe pain.  . nicotine (NICODERM CQ - DOSED IN MG/24 HR) 7 mg/24hr patch    Sig: Place 7 mg onto the skin daily. Rotate site  . haloperidol (HALDOL) 0.5 MG tablet    Sig: Place 0.25 mg into feeding tube daily. At 4 pm  . aspirin 81 MG chewable tablet    Sig: Place 81 mg into feeding tube every evening.  . Multiple Vitamins-Minerals (CENTAMIN) LIQD    Sig: Place 10 mLs into feeding tube daily before breakfast.  . Water For Irrigation, Sterile (STERILE WATER FOR IRRIGATION)    Sig: Irrigate with 250 mLs as directed every 6 (six) hours.  . Multiple Vitamins-Minerals (MULTIVITAMIN & MINERAL) LIQD    Sig: Place 10 mLs into feeding tube daily before breakfast.  . Nutritional Supplements (FEEDING SUPPLEMENT, JEVITY 1.2 CAL,) LIQD    Sig: Place 237 mLs into feeding tube every 4 (four) hours.     There is no immunization history on file for this patient.  Social History  Substance Use Topics  . Smoking status: Former Smoker    Packs/day: 2.00    Years: 20.00    Types: Cigarettes    Quit date: 02/15/1995  . Smokeless tobacco: Never Used  . Alcohol use No    Review of Systems  DATA OBTAINED: from patient, nurse, PT,wife GENERAL:  no fevers, less fatigue, appetite changes SKIN: No itching, rash HEENT: No complaint RESPIRATORY: No cough, wheezing, SOB CARDIAC: No chest pain, palpitations, lower extremity edema  GI: No abdominal pain, No N/V ; no BM for several days, No heartburn or reflux  GU: No dysuria, frequency or urgency, or incontinence  MUSCULOSKELETAL: No unrelieved bone/joint pain NEUROLOGIC: No headache, dizziness  PSYCHIATRIC: No overt anxiety or sadness  Vitals:   01/12/16 1420  BP: 119/73  Pulse: 84  Resp: 20  Temp: 97.4 F (36.3 C)   Body mass index is 21.31 kg/m. Physical Exam  GENERAL APPEARANCE: Alert, conversant, No acute distress  SKIN: No diaphoresis rash HEENT: Unremarkable RESPIRATORY: Breathing is even, unlabored. Lung  sounds are clear   CARDIOVASCULAR: Heart RRR no murmurs, rubs or gallops. No peripheral edema  GASTROINTESTINAL: Abdomen is soft, non-tender, not distended w/ normal bowel sounds.  GENITOURINARY: Bladder non tender, not distended  MUSCULOSKELETAL: No abnormal joints or musculature NEUROLOGIC: Cranial nerves 2-12 grossly intact. Moves all extremities PSYCHIATRIC: Mood and affect appropriate to situation, no behavioral issues  Patient  Active Problem List   Diagnosis Date Noted  . 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 Vitamin B12 deficiency 01/02/2016  . BPH (benign prostatic hyperplasia)   . Posterior vitreous detachment   . Salzmann's nodular dystrophy of left eye   . Hyperopia of both eyes with astigmatism and presbyopia   . Central pterygium of right eye   . Pressure injury of skin 12/30/2015  . Hypernatremia 12/28/2015  . Tracheostomy in place [facility]) 12/25/2015  . Pneumatosis of intestines 12/11/2015  . Tobacco abuse 12/09/2015  . Ischemic right index finger at tip 12/09/2015  . Gastritis 12/09/2015  . Gastric tube present (LUQ) 12/09/2015  . Jejunostomy tube present (LLQ) 12/09/2015  . Acute respiratory failure with hypoxemia (HCC)   . Protein-calorie malnutrition, severe 12/04/2015  . Encephalopathy acute 12/03/2015  . Bile peritonitis due to gastric tube dislodgement 12/03/2015  . Gastric Ileus  11/25/2015  . Gastric outlet obstruction 11/24/2015  . Pyloric stricture 11/24/2015  . Anxiety state 09/28/2015  . Hyperlipidemia   . 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 Coronary atherosclerosis of native coronary artery 04/24/2013  . Essential hypertension, benign 04/24/2013  . Other and unspecified hyperlipidemia 04/24/2013  . Central pterygium 10/30/2012  . 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 Cataract, nuclear sclerotic senile 10/30/2012    CMP     Component Value Date/Time    NA 153 (H) 12/29/2015 0900   NA 153 (A) 12/29/2015   K 3.8 12/29/2015 0900   CL 123 (H) 12/29/2015 0900   CO2 23 12/29/2015 0900   GLUCOSE 117 (H) 12/29/2015 0900   BUN 63 (H) 12/29/2015 0900   BUN 63 (A) 12/29/2015   CREATININE 1.44 (H) 12/29/2015 0900   CALCIUM 7.5 (L) 12/29/2015 0900   PROT 5.8 (L) 12/28/2015 0419   ALBUMIN 1.6 (L) 12/28/2015 0419   AST 42 (H) 12/28/2015 0419   ALT 51 12/28/2015 0419   ALKPHOS 340 (H) 12/28/2015 0419   BILITOT 0.8 12/28/2015 0419   GFRNONAA 46 (L) 12/29/2015 0900   GFRAA 53 (L) 12/29/2015 0900    Recent Labs  12/21/15 0530  12/24/15 0435  12/27/15 0348 12/28/15 12/28/15 0419 12/29/15 12/29/15 0900  NA 147*  < > 152*  < > 155* 154* 154* 153* 153*  K 3.5  < > 3.8  < > 3.7 3.7 3.7  --  3.8  CL 115*  < > 121*  < > 125*  --  124*  --  123*  CO2 24  < > 23  < > 24  --  24  --  23  GLUCOSE 118*  < > 140*  < > 133*  --  132*  --  117*  BUN 40*  < > 58*  < > 79* 69* 69* 63* 63*  CREATININE 1.06  < > 1.19  < > 1.33* 1.3 1.25* 1.4* 1.44*  CALCIUM 8.4*  < > 8.0*  < > 7.3*  --  7.3*  --  7.5*  MG 2.0  --  2.2  --   --   --  2.1  --   --   PHOS 4.3  --  3.4  --   --   --  2.8  --   --   < > = values in this interval not displayed.  Recent Labs  12/21/15 0530 12/24/15 0435 12/28/15 0419  AST 31 43* 42*  ALT 28 44 51  ALKPHOS 212*  305* 340*  BILITOT 1.4* 1.1 0.8  PROT 6.1* 6.2* 5.8*  ALBUMIN 1.6* 1.6* 1.6*    Recent Labs  12/14/15 0549 12/15/15 0424 12/18/15 1159 12/21/15 0530 12/29/15 12/29/15 0900  WBC 7.7 10.2 10.6* 8.9 15.3 15.3*  NEUTROABS 5.1 7.0  --  6.1  --   --   HGB 10.3* 8.8* 8.6* 8.4*  --  7.8*  HCT 31.0* 26.4* 26.0* 25.9*  --  24.7*  MCV 88.8 89.2 87.5 89.0  --  89.8  PLT 516* 633* 573* 473*  --  509*    Recent Labs  12/14/15 0549 12/21/15 0530 01/11/16  TRIG 194* 199* 177*   No results found for: MICROALBUR No results found for: TSH No results found for: HGBA1C Lab Results  Component Value Date   TRIG 177  (A) 01/11/2016    Significant Diagnostic Results in last 30 days:  Dg Abd 1 View  Result Date: 12/18/2015 CLINICAL DATA:  Jejunostomy tube replacement. EXAM: ABDOMEN - 1 VIEW COMPARISON:  Procedural spot fluoroscopic 12/13/2015. CT abdomen and pelvis 12/10/2015. FINDINGS: Percutaneous jejunostomy tube overlies the left mid abdomen. Injected contrast opacifies an adjacent small bowel loop. A small amount of contrast is partially visualized in the left upper quadrant in the expected region of the GE junction and may be within the distal esophagus and stomach. Abdominal surgical clips are present. Gas is present in grossly nondilated loops of small and large bowel to the level of the rectum without evidence of obstruction. IMPRESSION: Satisfactory positioning of percutaneous jejunostomy tube based on appearance of injected contrast material. Electronically Signed   By: Sebastian Ache M.D.   On: 12/18/2015 10:13   Ir Replc Duoden/jejuno Tube Percut W/fluoro  Result Date: 12/15/2015 INDICATION: Jejunostomy tube pulled out EXAM: IR REPLACE DUODEN/JEJUNO TUBE PERCUT WITH FLOURO MEDICATIONS: None ANESTHESIA/SEDATION: None CONTRAST:  15mL ISOVUE-300 IOPAMIDOL (ISOVUE-300) INJECTION 61% - administered into the gastric lumen. FLUOROSCOPY TIME:  Fluoroscopy Time: 1 minutes 30 seconds (11.4 mGy). COMPLICATIONS: None immediate. PROCEDURE: Informed written consent was obtained from the patient after a thorough discussion of the procedural risks, benefits and alternatives. All questions were addressed. Maximal Sterile Barrier Technique was utilized including caps, mask, sterile gowns, sterile gloves, sterile drape, hand hygiene and skin antiseptic. A timeout was performed prior to the initiation of the procedure. The jejunostomy tube was completely pulled out. A Kumpe catheter was advanced into the jejunostomy tube entry site. Contrast was injected. The catheter was advanced over a Bentson into the loop of jejunum. The  Kumpe be was exchanged over a Bentson for a 12 French pigtail catheter. This was advanced into the jejunum, looped, and string fixed, then sewn to the skin. Contrast was injected. It was then attached to a gravity drainage bag. FINDINGS: Imaging confirms replacement of the jejunostomy tube in the left lower quadrant as described. IMPRESSION: Successful left lower quadrant jejunostomy tube replacement. Electronically Signed   By: [name] M.D.   On: 12/15/2015 08:19   Dg Swallowing Func-speech Pathology  Result Date: 12/29/2015 Objective Swallowing Evaluation: Type of Study: MBS-Modified Barium Swallow Study Patient Details Name: LATTIE CERVI MRN: [id number] Date of Birth: 20-Jun-1940 Today's Date: 12/29/2015 Time: SLP Start Time (ACUTE ONLY): 0905-SLP Stop Time (ACUTE ONLY): 0940 SLP Time Calculation (min) (ACUTE ONLY): 35 min Past Medical History: Past Medical History: Diagnosis Date . Bilateral dry eyes  . Coronary artery disease 1997  bare mental stent right coronart artery . Degenerative disc disease   LOWER BACK . Dysphonia  .  ED (erectile dysfunction)  . Gastroesophageal reflux disease  . Hyperlipidemia  . Hypertension  . Peptic ulcer  . Pneumonia   HISTORY OF IN CHILDHOOD . Posterior vitreous detachment 10/30/2012 . Urinary hesitancy  . Wears glasses  Past Surgical History: Past Surgical History: Procedure Laterality Date . CARDIAC CATHETERIZATION   . CHOLECYSTECTOMY   . ESOPHAGOGASTRODUODENOSCOPY N/A 12/01/2015  Procedure: ESOPHAGOGASTRODUODENOSCOPY (EGD) BALLOON DILATION OF DUODENAL STRICTURE;  Surgeon: Karie SodaSteven Gross, MD;  Location: [facility];  Service: General;  Laterality: N/A; . ESOPHAGOGASTRODUODENOSCOPY N/A 12/14/2015  Procedure: ESOPHAGOGASTRODUODENOSCOPY (EGD);  Surgeon: Vida RiggerMarc Magod, MD;  [facility];  Service: Endoscopy;  Laterality: N/A;  Patient has tracheostomy . ESOPHAGOGASTRODUODENOSCOPY (EGD) WITH PROPOFOL N/A 07/01/2015  Procedure: ESOPHAGOGASTRODUODENOSCOPY (EGD) WITH PROPOFOL;   Surgeon: Willis ModenaWilliam Outlaw, MD;  Location: [facility];  Service: Endoscopy;  Laterality: N/A; . ESOPHAGOGASTRODUODENOSCOPY (EGD) WITH PROPOFOL N/A 11/23/2015  Procedure: ESOPHAGOGASTRODUODENOSCOPY (EGD) WITH PROPOFOL;  Surgeon: Willis ModenaWilliam Outlaw, MD;  [facility];  Service: Endoscopy;  Laterality: N/A;  may need to intubate . EYE SURGERY    growth on right eye removed  . IR GENERIC HISTORICAL  12/13/2015  IR REPLC DUODEN/JEJUNO TUBE PERCUT W/FLUORO 12/13/2015 Jolaine ClickArthur Hoss, MD [facility]INTER[facility] . LAPAROSCOPIC GASTROSTOMY N/A 12/01/2015  Procedure: LAPAROSCOPIC PLACEMENT OF FEEDING JEJUNOSTOMY AND GASTROSTOMY TUBE;  Surgeon: Karie SodaSteven Gross, MD;  Location: [facility];  Service: General;  Laterality: N/A; . LAPAROSCOPY N/A 12/03/2015  Procedure: LAPAROSCOPY DIAGNOSTIC, OMENTOPEXY, JEJUNOSTOMY, WASH OUT;  Surgeon: Karie SodaSteven Gross, MD;  Location: [facility];  Service: General;  Laterality: N/A; . STENT TO HEART    2 1997 AND 1 IN 1996 . TONSILLECTOMY   . XI ROBOTIC VAGOTOMY AND ANTRECTOMY N/A 09/25/2015  Procedure: XI ROBOTIC ANTERIOR AND POSTERIOR VAGOTOMY, BILROTH I  ANASTOMOSIS DOR FUNDIPLICATION, OMENTOPEXY  UPPER ENDOSCOPY;  Surgeon: Karie SodaSteven Gross, MD;  Location: [facility];  Service: General;  Laterality: N/A; HPI: 75 yo male former smoker with progressive epigastric pain, nausea, vomiting, bloating from gastric outlet obstruction with pyloric channel ulcer.  Had Billroth 1, vagotomy, fundoplication, omentopexy August 2017 with initial improvement.  Had recurrent symptoms September 2017 that become progressively worse with gastric dilation from recurrent stricture.  Had laparoscopic placement of jejunostomy tube, gastrostomy tube, and EGD balloon dilation of duodenal stricture 10/17.  Developed gastric leakage with peritonitis after pulling out PEG tube 10/19 and taken to OR. On 10/27 found 2 new LUQ abscesses/fluid collections which were drained percutaneously by IR. Had perc trach 10/27.  As of 10/30 his mental status has  improved.  Pt underwent MBS and has been on TPN since due to level of dysphagia.  Ice chips have been consumed with improved tolerance.  MD ordered repeat MBS.   Subjective: pt awake in bed, weak Assessment / Plan / Recommendation CHL IP CLINICAL IMPRESSIONS 12/29/2015 Therapy Diagnosis Moderate oral phase dysphagia;Moderate pharyngeal phase dysphagia;Moderate cervical esophageal phase dysphagia Clinical Impression Patient continues with moderate oropharyngeal dysphagia that appears marginally worse than prior MBS 12/17/15 results.  Swallow function continues to be significantly weak resulting in residuals and aspiration with poor cough mechanism.   Pt again fatigued during MBS and at times had difficulty keeping his eyes open.   Viscous green tinged secretions coughed through trach (blew off PMSV) after oral care and before barium administration.  Pt also with copious secretions coughed AROUND trach following MBS *pt had PMSV in place.   Gross weakness secretion retention that mixed with barium without consistent awareness.  Delayed oral transiting, lingual pumping and premature spillage of barium  into pharynx noted due to weakness.  Compromised tongue base retraction and laryngeal elevation allowed laryngeal penetration and SILENT aspiration both during and after the swallow.  Cues to cough/throat clear and dry swallow did not fully clear penetrates and patient quickly re-penetrated pharyngeal residuals.  In addition patient appeared with decreased proximal esophageal opening/decreased CP opening resulting in residuals in pyriform sinus.  Unfortunately patient is at high risk of aspiration with intake at this time.  Using teach back/live video educated patient and daughter to findings and reinforced effective compensation strategies.   Recommend continue NPO except single ice chips with strict precautions - oral care, pmsv in place and cueing patient to cough/clear throat and re-swallow.  Pt also with increase in  delayed responses to SLP instructions/cues today- and states "I'm weak".  Given pt's lack of swallow improvement, SlP is concerned for swallow prognosis during acute stay.  Pt will need repeat MBS in future due to silent nature of dysphagia. Will follow up for dysphagia treatment/management.   Thanks for allowing me to help care for this patient.  Impact on safety and function Severe aspiration risk;Risk for inadequate nutrition/hydration   CHL IP TREATMENT RECOMMENDATION 12/29/2015 Treatment Recommendations F/U MBS in --- days (Comment)   Prognosis 12/29/2015 Prognosis for Safe Diet Advancement Guarded Barriers to Reach Goals Severity of deficits;Cognitive deficits Barriers/Prognosis Comment -- CHL IP DIET RECOMMENDATION 12/29/2015 SLP Diet Recommendations NPO;Ice chips PRN after oral care Liquid Administration via -- Medication Administration Via alternative means Compensations Minimize environmental distractions;Slow rate;Multiple dry swallows after each bite/sip;Hard cough after swallow Postural Changes --   CHL IP OTHER RECOMMENDATIONS 12/29/2015 Recommended Consults -- Oral Care Recommendations Oral care QID Other Recommendations --   CHL IP FOLLOW UP RECOMMENDATIONS 12/29/2015 Follow up Recommendations Skilled Nursing facility;Inpatient Rehab   CHL IP FREQUENCY AND DURATION 12/29/2015 Speech Therapy Frequency (ACUTE ONLY) min 2x/week Treatment Duration 2 weeks      CHL IP ORAL PHASE 12/29/2015 Oral Phase Impaired Oral - Pudding Teaspoon -- Oral - Pudding Cup -- Oral - Honey Teaspoon -- Oral - Honey Cup -- Oral - Nectar Teaspoon -- Oral - Nectar Cup -- Oral - Nectar Straw -- Oral - Thin Teaspoon Weak lingual manipulation;Reduced posterior propulsion;Lingual/palatal residue;Decreased bolus cohesion;Delayed oral transit;Premature spillage Oral - Thin Cup NT Oral - Thin Straw NT Oral - Puree Weak lingual manipulation;Lingual/palatal residue;Reduced posterior propulsion;Premature spillage;Decreased bolus  cohesion;Delayed oral transit Oral - Mech Soft -- Oral - Regular -- Oral - Multi-Consistency -- Oral - Pill -- Oral Phase - Comment --  CHL IP PHARYNGEAL PHASE 12/29/2015 Pharyngeal Phase Impaired Pharyngeal- Pudding Teaspoon -- Pharyngeal -- Pharyngeal- Pudding Cup -- Pharyngeal -- Pharyngeal- Honey Teaspoon -- Pharyngeal -- Pharyngeal- Honey Cup -- Pharyngeal -- Pharyngeal- Nectar Teaspoon Reduced pharyngeal peristalsis;Reduced epiglottic inversion;Reduced anterior laryngeal mobility;Reduced laryngeal elevation;Reduced airway/laryngeal closure;Reduced tongue base retraction;Penetration/Aspiration during swallow;Pharyngeal residue - valleculae;Lateral channel residue;Pharyngeal residue - cp segment Pharyngeal Material enters airway, remains ABOVE vocal cords and not ejected out Pharyngeal- Nectar Cup Delayed swallow initiation-pyriform sinuses;Reduced pharyngeal peristalsis;Reduced epiglottic inversion;Reduced anterior laryngeal mobility;Reduced laryngeal elevation;Reduced airway/laryngeal closure;Reduced tongue base retraction;Penetration/Aspiration during swallow;Penetration/Apiration after swallow;Significant aspiration (Amount);Pharyngeal residue - valleculae;Pharyngeal residue - pyriform;Lateral channel residue;Pharyngeal residue - cp segment Pharyngeal Material enters airway, passes BELOW cords without attempt by patient to eject out (silent aspiration);Material enters airway, passes BELOW cords and not ejected out despite cough attempt by patient Pharyngeal- Nectar Straw -- Pharyngeal -- Pharyngeal- Thin Teaspoon Delayed swallow initiation-vallecula;Reduced pharyngeal peristalsis;Reduced epiglottic inversion;Reduced anterior laryngeal mobility;Reduced laryngeal elevation;Reduced airway/laryngeal closure;Reduced  tongue base retraction;Penetration/Aspiration during swallow;Penetration/Aspiration before swallow;Pharyngeal residue - valleculae;Lateral channel residue;Trace aspiration;Pharyngeal residue - cp  segment Pharyngeal Material enters airway, passes BELOW cords without attempt by patient to eject out (silent aspiration) Pharyngeal- Thin Cup NT Pharyngeal -- Pharyngeal- Thin Straw NT Pharyngeal -- Pharyngeal- Puree Delayed swallow initiation-vallecula;Reduced epiglottic inversion;Reduced pharyngeal peristalsis;Reduced anterior laryngeal mobility;Reduced laryngeal elevation;Reduced airway/laryngeal closure;Reduced tongue base retraction;Pharyngeal residue - valleculae;Pharyngeal residue - pyriform Pharyngeal -- Pharyngeal- Mechanical Soft -- Pharyngeal -- Pharyngeal- Regular -- Pharyngeal -- Pharyngeal- Multi-consistency -- Pharyngeal -- Pharyngeal- Pill -- Pharyngeal -- Pharyngeal Comment pt continues with gross residuals and poor awareness, WEAK dry swallow/throat clear and cough were ineffective to fully clear aspiration/penetration/residuals, pt did have viscous colored secretions *green tinged* coughed and expectorated from trach prior to MBS and around the trach AFTER   CHL IP CERVICAL ESOPHAGEAL PHASE 12/29/2015 Cervical Esophageal Phase -- Pudding Teaspoon -- Pudding Cup -- Honey Teaspoon -- Honey Cup -- Nectar Teaspoon Prominent cricopharyngeal segment;Reduced cricopharyngeal relaxation Nectar Cup Prominent cricopharyngeal segment;Reduced cricopharyngeal relaxation Nectar Straw -- Thin Teaspoon Reduced cricopharyngeal relaxation;Prominent cricopharyngeal segment Thin Cup NT Thin Straw NT Puree Reduced cricopharyngeal relaxation;Prominent cricopharyngeal segment Mechanical Soft -- Regular -- Multi-consistency -- Pill -- Cervical Esophageal Comment -- No flowsheet data found. Donavan Burnet, MS [facility] SLP [phone]              Dg Swallowing Func-speech Pathology  Result Date: 12/17/2015 Objective Swallowing Evaluation: Type of Study: MBS-Modified Barium Swallow Study Patient Details Name: [name] MRN: [id number] Date of Birth: May 06, 1940 Today's Date: 12/17/2015 Time: SLP Start Time (ACUTE ONLY):  0925-SLP Stop Time (ACUTE ONLY): 0945 SLP Time Calculation (min) (ACUTE ONLY): 20 min Past Medical History: Past Medical History: Diagnosis Date . Bilateral dry eyes  . Coronary artery disease 1997  bare mental stent right coronart artery . Degenerative disc disease   LOWER BACK . Dysphonia  . ED (erectile dysfunction)  . Gastroesophageal reflux disease  . Hyperlipidemia  . Hypertension  . Peptic ulcer  . Pneumonia   HISTORY OF IN CHILDHOOD . Posterior vitreous detachment 10/30/2012 . Urinary hesitancy  . Wears glasses  Past Surgical History: Past Surgical History: Procedure Laterality Date . CARDIAC CATHETERIZATION   . CHOLECYSTECTOMY   . ESOPHAGOGASTRODUODENOSCOPY N/A 12/01/2015  Procedure: ESOPHAGOGASTRODUODENOSCOPY (EGD) BALLOON DILATION OF DUODENAL STRICTURE;  Surgeon: Karie Soda, MD;  Location: [facility];  Service: General;  Laterality: N/A; . ESOPHAGOGASTRODUODENOSCOPY N/A 12/14/2015  Procedure: ESOPHAGOGASTRODUODENOSCOPY (EGD);  Surgeon: Vida Rigger, MD;  Location: [facility];  Service: Endoscopy;  Laterality: N/A;  Patient has tracheostomy . ESOPHAGOGASTRODUODENOSCOPY (EGD) WITH PROPOFOL N/A 07/01/2015  Procedure: ESOPHAGOGASTRODUODENOSCOPY (EGD) WITH PROPOFOL;  Surgeon: Willis Modena, MD;  Location: [facility];  Service: Endoscopy;  Laterality: N/A; . ESOPHAGOGASTRODUODENOSCOPY (EGD) WITH PROPOFOL N/A 11/23/2015  Procedure: ESOPHAGOGASTRODUODENOSCOPY (EGD) WITH PROPOFOL;  Surgeon: Willis Modena, MD;  Location: [facility];  Service: Endoscopy;  Laterality: N/A;  may need to intubate . EYE SURGERY    growth on right eye removed  . IR GENERIC HISTORICAL  12/13/2015  IR REPLC DUODEN/JEJUNO TUBE PERCUT W/FLUORO 12/13/2015 [name], MD [facility] RAD . LAPAROSCOPIC GASTROSTOMY N/A 12/01/2015  Procedure: LAPAROSCOPIC PLACEMENT OF FEEDING JEJUNOSTOMY AND GASTROSTOMY TUBE;  Surgeon: Karie Soda, MD;  Location: [facility];  Service: General;  Laterality: N/A; . LAPAROSCOPY N/A 12/03/2015  Procedure: LAPAROSCOPY  DIAGNOSTIC, OMENTOPEXY, JEJUNOSTOMY, WASH OUT;  Surgeon: Karie Soda, MD;  Location: [facility];  Service: General;  Laterality: N/A; . STENT TO HEART    2 1997 AND 1 IN 1996 .  TONSILLECTOMY   . XI ROBOTIC VAGOTOMY AND ANTRECTOMY N/A 09/25/2015  Procedure: XI ROBOTIC ANTERIOR AND POSTERIOR VAGOTOMY, BILROTH I  ANASTOMOSIS DOR FUNDIPLICATION, OMENTOPEXY  UPPER ENDOSCOPY;  Surgeon: Karie SodaSteven Gross, MD;  Location: [facility];  Service: General;  Laterality: N/A; HPI: 75 yo male former smoker with progressive epigastric pain, nausea, vomiting, bloating from gastric outlet obstruction with pyloric channel ulcer.  Had Billroth 1, vagotomy, fundoplication, omentopexy August 2017 with initial improvement.  Had recurrent symptoms September 2017 that become progressively worse with gastric dilation from recurrent stricture.  Had laparoscopic placement of jejunostomy tube, gastrostomy tube, and EGD balloon dilation of duodenal stricture 10/17.  Developed gastric leakage with peritonitis after pulling out PEG tube 10/19 and taken to OR. On 10/27 found 2 new LUQ abscesses/fluid collections which were drained percutaneously by IR. Had perc trach 10/27.  As of 10/30 his mental status has improved, he has remained off of vasopressors for several days and he has done well with weaning off the ventilator per MD note.  Subjective: pt awake in bed Assessment / Plan / Recommendation CHL IP CLINICAL IMPRESSIONS 12/17/2015 Therapy Diagnosis Moderate oral phase dysphagia;Moderate pharyngeal phase dysphagia;Moderate cervical esophageal phase dysphagia Clinical Impression Patient presents with moderate oropharyngeal dysphagia characterized by significant weakness resulting in residuals and aspiration.  Pt was only given minimal amount of thin and nectar barium during testing.  Of note, after patient was transferred to flouro chair, he demonstrated copious coughing.  He cleared scant secretions through trach tube at that time.  Transferring to chair and  coughing episode significantly fatigued patient.  PMSV was placed for MBS with good tolerance during testing.  Unfortunately patient is grossly weak resulting in oropharyngeal deficits and secretion retention that mixed with barium.  Delayed oral transiting, lingual pumping and premature spillage of barium into pharynx noted due to weakness.  Compromised tongue base retraction and laryngeal elevation allowed laryngeal penetration and SILENT aspiration both during and after the swallow.  Cues to cough/throat clear and dry swallow did not fully clear penetrates and patient quickly re-penetrated pharyngeal residuals.  In addition patient appeared with decreased proximal esophageal opening/decreased CP opening resulting in residuals in pyriform sinus.  Unfortunately patient is at high risk of aspiration with intake at this time.  Using teach back/live video educated patient to findings and reinforced effective compensation strategies.   Recommend continue NPO except single ice chips with strict precautions - oral care, pmsv in place and cueing patient to cough/clear throat and re-swallow.  Pt will need repeat MBS in future due to silent nature of dysphagia. Will follow up for dysphagia treatment/management.   Thanks for allowing me to help care for this patient.  Impact on safety and function Severe aspiration risk;Risk for inadequate nutrition/hydration   CHL IP TREATMENT RECOMMENDATION 12/17/2015 Treatment Recommendations F/U MBS in --- days (Comment)   Prognosis 12/17/2015 Prognosis for Safe Diet Advancement Fair Barriers to Reach Goals Severity of deficits;Cognitive deficits Barriers/Prognosis Comment -- CHL IP DIET RECOMMENDATION 12/17/2015 SLP Diet Recommendations NPO;Ice chips PRN after oral care Liquid Administration via -- Medication Administration Via alternative means Compensations Minimize environmental distractions;Slow rate;Multiple dry swallows after each bite/sip;Hard cough after swallow Postural Changes --    CHL IP OTHER RECOMMENDATIONS 12/17/2015 Recommended Consults -- Oral Care Recommendations Oral care QID Other Recommendations --   CHL IP FOLLOW UP RECOMMENDATIONS 12/17/2015 Follow up Recommendations Skilled Nursing facility;Inpatient Rehab   CHL IP FREQUENCY AND DURATION 12/17/2015 Speech Therapy Frequency (ACUTE ONLY) min 2x/week Treatment Duration 2 weeks  CHL IP ORAL PHASE 12/17/2015 Oral Phase Impaired Oral - Pudding Teaspoon -- Oral - Pudding Cup -- Oral - Honey Teaspoon -- Oral - Honey Cup -- Oral - Nectar Teaspoon -- Oral - Nectar Cup Weak lingual manipulation;Reduced posterior propulsion;Delayed oral transit;Decreased bolus cohesion;Lingual pumping Oral - Nectar Straw -- Oral - Thin Teaspoon Weak lingual manipulation;Delayed oral transit;Decreased bolus cohesion;Reduced posterior propulsion;Lingual pumping Oral - Thin Cup Weak lingual manipulation;Reduced posterior propulsion;Delayed oral transit;Decreased bolus cohesion;Lingual pumping Oral - Thin Straw Weak lingual manipulation;Decreased bolus cohesion;Delayed oral transit;Reduced posterior propulsion;Lingual pumping Oral - Puree -- Oral - Mech Soft -- Oral - Regular -- Oral - Multi-Consistency -- Oral - Pill -- Oral Phase - Comment --  CHL IP PHARYNGEAL PHASE 12/17/2015 Pharyngeal Phase Impaired Pharyngeal- Pudding Teaspoon -- Pharyngeal -- Pharyngeal- Pudding Cup -- Pharyngeal -- Pharyngeal- Honey Teaspoon -- Pharyngeal -- Pharyngeal- Honey Cup -- Pharyngeal -- Pharyngeal- Nectar Teaspoon Reduced laryngeal elevation;Pharyngeal residue - pyriform;Pharyngeal residue - valleculae;Reduced tongue base retraction;Reduced airway/laryngeal closure;Reduced epiglottic inversion;Penetration/Aspiration during swallow Pharyngeal Material enters airway, passes BELOW cords without attempt by patient to eject out (silent aspiration) Pharyngeal- Nectar Cup Reduced laryngeal elevation;Reduced tongue base retraction;Reduced anterior laryngeal mobility;Reduced epiglottic  inversion;Reduced airway/laryngeal closure;Penetration/Aspiration during swallow Pharyngeal Material enters airway, passes BELOW cords without attempt by patient to eject out (silent aspiration) Pharyngeal- Nectar Straw -- Pharyngeal -- Pharyngeal- Thin Teaspoon Reduced laryngeal elevation;Reduced airway/laryngeal closure;Reduced tongue base retraction;Penetration/Aspiration during swallow;Reduced epiglottic inversion;Trace aspiration;Pharyngeal residue - valleculae;Pharyngeal residue - pyriform;Pharyngeal residue - cp segment Pharyngeal Material enters airway, passes BELOW cords without attempt by patient to eject out (silent aspiration) Pharyngeal- Thin Cup Moderate aspiration;Penetration/Aspiration during swallow;Penetration/Apiration after swallow;Reduced pharyngeal peristalsis;Reduced epiglottic inversion;Reduced laryngeal elevation;Reduced airway/laryngeal closure;Reduced tongue base retraction Pharyngeal Material enters airway, passes BELOW cords without attempt by patient to eject out (silent aspiration) Pharyngeal- Thin Straw Reduced pharyngeal peristalsis;Reduced epiglottic inversion;Reduced anterior laryngeal mobility;Reduced laryngeal elevation;Reduced airway/laryngeal closure;Pharyngeal residue - valleculae;Pharyngeal residue - pyriform;Moderate aspiration;Penetration/Aspiration during swallow Pharyngeal Material enters airway, passes BELOW cords without attempt by patient to eject out (silent aspiration) Pharyngeal- Puree -- Pharyngeal -- Pharyngeal- Mechanical Soft -- Pharyngeal -- Pharyngeal- Regular -- Pharyngeal -- Pharyngeal- Multi-consistency -- Pharyngeal -- Pharyngeal- Pill -- Pharyngeal -- Pharyngeal Comment pt without awareness to residuals, penetration and aspiration did not fully clear despite cued cough, pt also with aspiration of secretions mixed with barium  CHL IP CERVICAL ESOPHAGEAL PHASE 12/17/2015 Cervical Esophageal Phase Impaired Pudding Teaspoon -- Pudding Cup -- Honey Teaspoon --  Honey Cup -- Nectar Teaspoon Reduced cricopharyngeal relaxation;Prominent cricopharyngeal segment Nectar Cup Reduced cricopharyngeal relaxation;Prominent cricopharyngeal segment Nectar Straw -- Thin Teaspoon Reduced cricopharyngeal relaxation;Prominent cricopharyngeal segment Thin Cup Reduced cricopharyngeal relaxation;Prominent cricopharyngeal segment Thin Straw Reduced cricopharyngeal relaxation;Prominent cricopharyngeal segment Puree -- Mechanical Soft -- Regular -- Multi-consistency -- Pill -- Cervical Esophageal Comment pt appeared with tight UES  Donavan Burnet, MS [facility] SLP [phone]               Assessment and Plan  HYPERNATREMIA/ METABOLIC ACIDOSIS/ PEG TUBE  - NA+ 136, K+ 4.0, Cl 105, CO2 16 BUN 27/Cr 1.0; Pt is getting TPN and getting free water 1000 cc daily per tube, are monitoring daily ; dietary has found osmolyte1.2 which is a low residue solution-it will be at SNF Wed  GENERALIZED WEAKNESS - which last week was profound, this week incredibly better; pt stood up for 15 sec; I witnessed him sit up on bed, transfer to wheelchair and sat up in wheelchair for > 1 hour, went outdoors for first time  Labs/tests ordered: BMP daily  Time spent > 25 min;> 50% of time with patient was spent reviewing records, labs, tests and studies, counseling and developing plan of care  Thurston Hole D. [name], MD

## 2016-01-13 ENCOUNTER — Encounter: Payer: Self-pay | Admitting: Internal Medicine

## 2016-01-13 ENCOUNTER — Non-Acute Institutional Stay (SKILLED_NURSING_FACILITY): Payer: Medicare Other | Admitting: Internal Medicine

## 2016-01-13 DIAGNOSIS — E43 Unspecified severe protein-calorie malnutrition: Secondary | ICD-10-CM | POA: Diagnosis not present

## 2016-01-13 DIAGNOSIS — E870 Hyperosmolality and hypernatremia: Secondary | ICD-10-CM | POA: Diagnosis not present

## 2016-01-13 DIAGNOSIS — L98491 Non-pressure chronic ulcer of skin of other sites limited to breakdown of skin: Secondary | ICD-10-CM

## 2016-01-13 DIAGNOSIS — E872 Acidosis, unspecified: Secondary | ICD-10-CM

## 2016-01-13 DIAGNOSIS — L89310 Pressure ulcer of right buttock, unstageable: Secondary | ICD-10-CM | POA: Diagnosis not present

## 2016-01-13 DIAGNOSIS — Z931 Gastrostomy status: Secondary | ICD-10-CM | POA: Diagnosis not present

## 2016-01-13 DIAGNOSIS — Z789 Other specified health status: Secondary | ICD-10-CM

## 2016-01-13 NOTE — Progress Notes (Signed)
Location:  [facility] Room Number: [phone] Place of Service:  SNF [phone])  [name]. [name], MD  Patient Care Team: [name], MD as PCP - General (Internal Medicine) Karie Soda, MD as Consulting Physician (General Surgery) Willis Modena, MD as Consulting Physician (Gastroenterology) Coralyn Helling, MD as Consulting Physician (Pulmonary Disease)  Extended Emergency Contact Information Primary Emergency Contact: [name] Address: [address]          [place], [place] [address] [name] Home Phone: [phone] Mobile Phone: [phone] Relation: Spouse Secondary Emergency Contact: [name] [place] of [place] Mobile Phone: [phone] Relation: Daughter    Allergies: Flomax [tamsulosin hcl]; Lisinopril; Lorazepam; and Uroxatral [alfuzosin hcl er]  Chief Complaint  Patient presents with  . Acute Visit    Acute    HPI: Patient is 75 y.o. male who is being followed for TPN, PEG tube feedings, metabolic acidosis and prior hypernatremia. Overall pt is dong really well strength wise . Per wound nurse pt's J and G tube sites are looking better and his unstageable r buttocks ulcer is almost healed.  Past Medical History:  Diagnosis Date  . Bilateral dry eyes   . BPH (benign prostatic hyperplasia)   . Central pterygium of right eye   . Coronary artery disease 1997, 2009   bare mental stent right coronart artery, occlusive followed by Dr. Garnette Scheuermann in [place]  . Degenerative disc disease    LOWER BACK  . Dysphonia   . ED (erectile dysfunction)   . Gastroesophageal reflux disease   . Hyperlipidemia   . Hyperopia of both eyes with astigmatism and presbyopia   . Hypertension   . Nuclear senile cataract    Dr. [facility] in [place]  . Peptic ulcer   . Pneumonia    HISTORY OF IN CHILDHOOD  . Posterior vitreous detachment 10/30/2012  . Salzmann's nodular dystrophy of left eye   .  Urinary hesitancy   . Wears glasses     Past Surgical History:  Procedure Laterality Date  . CARDIAC CATHETERIZATION    . CHOLECYSTECTOMY    . ESOPHAGOGASTRODUODENOSCOPY N/A 12/01/2015   Procedure: ESOPHAGOGASTRODUODENOSCOPY (EGD) BALLOON DILATION OF DUODENAL STRICTURE;  Surgeon: Karie Soda, MD;  Location: [facility];  Service: General;  Laterality: N/A;  . ESOPHAGOGASTRODUODENOSCOPY N/A 12/14/2015   Procedure: ESOPHAGOGASTRODUODENOSCOPY (EGD);  Surgeon: Vida Rigger, MD;  Location: [facility];  Service: Endoscopy;  Laterality: N/A;  Patient has tracheostomy  . ESOPHAGOGASTRODUODENOSCOPY (EGD) WITH PROPOFOL N/A 07/01/2015   Procedure: ESOPHAGOGASTRODUODENOSCOPY (EGD) WITH PROPOFOL;  Surgeon: Willis Modena, MD;  [facility]: [facility];  Service: Endoscopy;  Laterality: N/A;  . ESOPHAGOGASTRODUODENOSCOPY (EGD) WITH PROPOFOL N/A 11/23/2015   Procedure: ESOPHAGOGASTRODUODENOSCOPY (EGD) WITH PROPOFOL;  Surgeon: Willis Modena, MD;  Location: [facility] ENDOSCOPY;  Service: Endoscopy;  Laterality: N/A;  may need to intubate  . EYE SURGERY     growth on right eye removed   . IR GENERIC HISTORICAL  12/13/2015   IR REPLC DUODEN/JEJUNO TUBE PERCUT W/FLUORO 12/13/2015 [name], MD [facility]  . LAPAROSCOPIC GASTROSTOMY N/A 12/01/2015   Procedure: LAPAROSCOPIC PLACEMENT OF FEEDING JEJUNOSTOMY AND GASTROSTOMY TUBE;  Surgeon: Karie Soda, MD;  Location: [facility];  Service: General;  Laterality: N/A;  . LAPAROSCOPY N/A 12/03/2015   Procedure: LAPAROSCOPY DIAGNOSTIC, OMENTOPEXY, JEJUNOSTOMY, WASH OUT;  Surgeon: Karie Soda, MD;  Location: [facility];  Service: General;  Laterality: N/A;  . STENT TO HEART     2 1997 AND 1 IN  1996  . TONSILLECTOMY    . XI ROBOTIC VAGOTOMY AND ANTRECTOMY N/A 09/25/2015   Procedure: XI ROBOTIC ANTERIOR AND POSTERIOR VAGOTOMY, BILROTH I  ANASTOMOSIS DOR FUNDIPLICATION, OMENTOPEXY  UPPER ENDOSCOPY;  Surgeon: Karie Soda, MD;  Location: [facility];  Service: General;  Laterality: N/A;       Medication List       Accurate as of 01/13/16 11:59 PM. Always use your most recent med list.          aluminum-magnesium hydroxide-simethicone 200-200-20 MG/5ML Susp Commonly known as:  MAALOX Take 30 mLs by mouth every 6 (six) hours as needed.   aspirin 81 MG chewable tablet Place 81 mg into feeding tube every evening.   atorvastatin 40 MG tablet Commonly known as:  LIPITOR Place 40 mg into feeding tube at bedtime.   bisacodyl 10 MG suppository Commonly known as:  DULCOLAX Place 10 mg rectally daily as needed for moderate constipation or severe constipation.   bismuth subsalicylate 262 MG/15ML suspension Commonly known as:  PEPTO BISMOL Place 30 mLs into feeding tube every 8 (eight) hours as needed for indigestion or diarrhea or loose stools.   calcium-vitamin D 500-200 MG-UNIT tablet Commonly known as:  OSCAL WITH D Place 1 tablet into feeding tube daily with breakfast.   CENTAMIN Liqd Place 10 mLs into feeding tube daily before breakfast.   MULTIVITAMIN & MINERAL Liqd Place 10 mLs into feeding tube daily before breakfast.   feeding supplement (JEVITY 1.2 CAL) Liqd Place 237 mLs into feeding tube every 4 (four) hours.   feeding supplement (PRO-STAT SUGAR FREE 64) Liqd Place 30 mLs into feeding tube daily.   Ferrous Sulfate 220 (44 Fe) MG/5ML Liqd Place 7.5 mLs into feeding tube 3 (three) times daily.   FLUoxetine 20 MG tablet Commonly known as:  PROZAC Place 20 mg into feeding tube daily.   FLUoxetine 20 MG/5ML solution Commonly known as:  PROZAC Place 20 mg into feeding tube daily.   haloperidol 0.5 MG tablet Commonly known as:  HALDOL Place 0.25 mg into feeding tube 2 (two) times daily. At 4 pm   haloperidol 0.5 MG tablet Commonly known as:  HALDOL Take 0.5 mg by mouth 2 (two) times daily. At 6 am and 4:30 pm.   haloperidol 0.5 MG tablet Commonly known as:  HALDOL Place 0.5 mg into feeding tube as needed for agitation. STOP DATE  01/24/2016   HYDROcodone-acetaminophen 5-325 MG tablet Commonly known as:  NORCO/VICODIN Place 1-2 tablets into feeding tube every 6 (six) hours as needed for moderate pain or severe pain.   loperamide 1 MG/5ML solution Commonly known as:  IMODIUM 10 mg. Take 10 mls via tube every 8 hours as needed for more than 2 bms every 8 hours.   loperamide 1 MG/5ML solution Commonly known as:  IMODIUM Place 10 mLs (2 mg total) into feeding tube as needed for diarrhea or loose stools (2mg  per G tube q8h PRN more than 2BMs q8H).   magic mouthwash Soln Take 15 mLs by mouth 4 (four) times daily as needed for mouth pain (sore throat).   metoCLOPramide 5 MG/5ML solution Commonly known as:  REGLAN Place 10 mg into feeding tube every 6 (six) hours as needed for nausea or vomiting.   metoprolol tartrate 25 MG tablet Commonly known as:  LOPRESSOR Place 25 mg into feeding tube daily.   metoprolol tartrate 25 MG tablet Commonly known as:  LOPRESSOR Place 0.5 tablets (12.5 mg total) into feeding tube daily.   nicotine  7 mg/24hr patch Commonly known as:  NICODERM CQ - dosed in mg/24 hr Place 7 mg onto the skin daily. Rotate site   nitroGLYCERIN 0.4 MG SL tablet Commonly known as:  NITROSTAT Place 0.4 mg under the tongue every 5 (five) minutes as needed for chest pain.   omeprazole 40 MG capsule Commonly known as:  PRILOSEC Take 40 mg by mouth 2 (two) times daily.   REFRESH OP Place 2 drops into both eyes as needed (For dry eyes.).   RESTASIS 0.05 % ophthalmic emulsion Generic drug:  cycloSPORINE Place 1 drop into both eyes 2 (two) times daily.   sterile water for irrigation Irrigate with 250 mLs as directed every 6 (six) hours.   traZODone 50 MG tablet Commonly known as:  DESYREL Place 50 mg into feeding tube at bedtime.   Vitamin B-12 1000 MCG Subl Place 1 tablet under the tongue daily with breakfast.       No orders of the defined types were placed in this  encounter.   Immunization History  Administered Date(s) Administered  . PPD Test 01/13/2016    Social History  Substance Use Topics  . Smoking status: Former Smoker    Packs/day: 2.00    Years: 20.00    Types: Cigarettes    Quit date: 02/15/1995  . Smokeless tobacco: Never Used  . Alcohol use No    Review of Systems  DATA OBTAINED: from patient, nurse, medical record, family member GENERAL:  no fevers, fatigue, appetite changes SKIN: No itching, rash HEENT: No complaint RESPIRATORY: No cough, wheezing, SOB CARDIAC: No chest pain, palpitations, lower extremity edema  GI: No abdominal pain, No N/V/D ; some constipation, No heartburn or reflux  GU: No dysuria, frequency or urgency, or incontinence  MUSCULOSKELETAL: No unrelieved bone/joint pain NEUROLOGIC: No headache, dizziness  PSYCHIATRIC: No overt anxiety or sadness  Vitals:   01/13/16 1347  BP: 118/70  Pulse: 66  Resp: 20  Temp: 98.4 F (36.9 C)   Body mass index is 21.31 kg/m. Physical Exam  GENERAL APPEARANCE: Alert, conversant, No acute distress  SKIN: No diaphoresis rash HEENT: Unremarkable RESPIRATORY: Breathing is even, unlabored. Lung sounds are clear   CARDIOVASCULAR: Heart RRR no murmurs, rubs or gallops. No peripheral edema  GASTROINTESTINAL: Abdomen is soft, non-tender, not distended w/ normal bowel sounds; peg TUBE - watched wound care change dressings, site is much improved from last week; J tube site looks even better, almost healed up. GENITOURINARY: Bladder non tender, not distended  MUSCULOSKELETAL: No abnormal joints or musculature NEUROLOGIC: Cranial nerves 2-12 grossly intact. Moves all extremities PSYCHIATRIC: Mood and affect appropriate to situation, no behavioral issues  Patient Active Problem List   Diagnosis Date Noted  . 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 Vitamin B12 deficiency 01/02/2016  . BPH (benign prostatic hyperplasia)   . Posterior vitreous detachment   .  Salzmann's nodular dystrophy of left eye   . Hyperopia of both eyes with astigmatism and presbyopia   . Central pterygium of right eye   . Pressure injury of skin 12/30/2015  . Hypernatremia 12/28/2015  . Tracheostomy in place [facility]) 12/25/2015  . Pneumatosis of intestines 12/11/2015  . Tobacco abuse 12/09/2015  . Ischemic right index finger at tip 12/09/2015  . Gastritis 12/09/2015  . Gastric tube present (LUQ) 12/09/2015  . Jejunostomy tube present (LLQ) 12/09/2015  . Acute respiratory failure with hypoxemia (HCC)   . Protein-calorie malnutrition, severe 12/04/2015  . Encephalopathy acute 12/03/2015  . Bile peritonitis due  to gastric tube dislodgement 12/03/2015  . Gastric Ileus  11/25/2015  . Gastric outlet obstruction 11/24/2015  . Pyloric stricture 11/24/2015  . Anxiety state 09/28/2015  . Hyperlipidemia   . 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 Coronary atherosclerosis of native coronary artery 04/24/2013  . Essential hypertension, benign 04/24/2013  . Other and unspecified hyperlipidemia 04/24/2013  . Central pterygium 10/30/2012  . 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 Cataract, nuclear sclerotic senile 10/30/2012    CMP     Component Value Date/Time   NA 139 01/14/2016   K 3.8 01/14/2016   CL 123 (H) 12/29/2015 0900   CO2 23 12/29/2015 0900   GLUCOSE 117 (H) 12/29/2015 0900   BUN 23 (A) 01/14/2016   CREATININE 0.8 01/14/2016   CREATININE 1.44 (H) 12/29/2015 0900   CALCIUM 7.5 (L) 12/29/2015 0900   PROT 5.8 (L) 12/28/2015 0419   ALBUMIN 1.6 (L) 12/28/2015 0419   AST 42 (H) 12/28/2015 0419   ALT 51 12/28/2015 0419   ALKPHOS 340 (H) 12/28/2015 0419   BILITOT 0.8 12/28/2015 0419   GFRNONAA 46 (L) 12/29/2015 0900   GFRAA 53 (L) 12/29/2015 0900    Recent Labs  12/21/15 0530  12/24/15 0435  12/27/15 0348  12/28/15 0419 12/29/15 12/29/15 0900 01/14/16  NA 147*  < > 152*  < >  155*  < > 154* 153* 153* 139  K 3.5  < > 3.8  < > 3.7  < > 3.7  --  3.8 3.8  CL 115*  < > 121*  < > 125*  --  124*  --  123*  --   CO2 24  < > 23  < > 24  --  24  --  23  --   GLUCOSE 118*  < > 140*  < > 133*  --  132*  --  117*  --   BUN 40*  < > 58*  < > 79*  < > 69* 63* 63* 23*  CREATININE 1.06  < > 1.19  < > 1.33*  < > 1.25* 1.4* 1.44* 0.8  CALCIUM 8.4*  < > 8.0*  < > 7.3*  --  7.3*  --  7.5*  --   MG 2.0  --  2.2  --   --   --  2.1  --   --   --   PHOS 4.3  --  3.4  --   --   --  2.8  --   --   --   < > = values in this interval not displayed.  Recent Labs  12/21/15 0530 12/24/15 0435 12/28/15 0419  AST 31 43* 42*  ALT 28 44 51  ALKPHOS 212* 305* 340*  BILITOT 1.4* 1.1 0.8  PROT 6.1* 6.2* 5.8*  ALBUMIN 1.6* 1.6* 1.6*    Recent Labs  12/14/15 0549 12/15/15 0424 12/18/15 1159 12/21/15 0530 12/29/15 12/29/15 0900 01/14/16  WBC 7.7 10.2 10.6* 8.9 15.3 15.3* 8.0  NEUTROABS 5.1 7.0  --  6.1  --   --   --   HGB 10.3* 8.8* 8.6* 8.4*  --  7.8* 11.2*  HCT 31.0* 26.4* 26.0* 25.9*  --  24.7* 35*  MCV 88.8 89.2 87.5 89.0  --  89.8  --   PLT 516* 633* 573* 473*  --  509* 290    Recent Labs  12/14/15 0549 12/21/15 0530 01/11/16  TRIG 194* 199* 177*  No results found for: MICROALBUR No results found for: TSH No results found for: HGBA1C Lab Results  Component Value Date   TRIG 177 (A) 01/11/2016    Significant Diagnostic Results in last 30 days:  Dg Abd 1 View  Result Date: 12/18/2015 CLINICAL DATA:  Jejunostomy tube replacement. EXAM: ABDOMEN - 1 VIEW COMPARISON:  Procedural spot fluoroscopic 12/13/2015. CT abdomen and pelvis 12/10/2015. FINDINGS: Percutaneous jejunostomy tube overlies the left mid abdomen. Injected contrast opacifies an adjacent small bowel loop. A small amount of contrast is partially visualized in the left upper quadrant in the expected region of the GE junction and may be within the distal esophagus and stomach. Abdominal surgical clips are  present. Gas is present in grossly nondilated loops of small and large bowel to the level of the rectum without evidence of obstruction. IMPRESSION: Satisfactory positioning of percutaneous jejunostomy tube based on appearance of injected contrast material. Electronically Signed   By: Sebastian Ache M.D.   On: 12/18/2015 10:13   Dg Swallowing Func-speech Pathology  Result Date: 12/29/2015 Objective Swallowing Evaluation: Type of Study: MBS-Modified Barium Swallow Study Patient Details Name: [name] MRN: [id number] Date of Birth: 1941/01/02 Today's Date: 12/29/2015 Time: SLP Start Time (ACUTE ONLY): 0905-SLP Stop Time (ACUTE ONLY): 0940 SLP Time Calculation (min) (ACUTE ONLY): 35 min Past Medical History: Past Medical History: Diagnosis Date . Bilateral dry eyes  . Coronary artery disease 1997  bare mental stent right coronart artery . Degenerative disc disease   LOWER BACK . Dysphonia  . ED (erectile dysfunction)  . Gastroesophageal reflux disease  . Hyperlipidemia  . Hypertension  . Peptic ulcer  . Pneumonia   HISTORY OF IN CHILDHOOD . Posterior vitreous detachment 10/30/2012 . Urinary hesitancy  . Wears glasses  Past Surgical History: Past Surgical History: Procedure Laterality Date . CARDIAC CATHETERIZATION   . CHOLECYSTECTOMY   . ESOPHAGOGASTRODUODENOSCOPY N/A 12/01/2015  Procedure: ESOPHAGOGASTRODUODENOSCOPY (EGD) BALLOON DILATION OF DUODENAL STRICTURE;  Surgeon: Karie Soda, MD;  Location: [facility];  Service: General;  Laterality: N/A; . ESOPHAGOGASTRODUODENOSCOPY N/A 12/14/2015  Procedure: ESOPHAGOGASTRODUODENOSCOPY (EGD);  Surgeon: Vida Rigger, MD;  Location: [facility];  Service: Endoscopy;  Laterality: N/A;  Patient has tracheostomy . ESOPHAGOGASTRODUODENOSCOPY (EGD) WITH PROPOFOL N/A 07/01/2015  Procedure: ESOPHAGOGASTRODUODENOSCOPY (EGD) WITH PROPOFOL;  Surgeon: Willis Modena, MD;  [facility];  Service: Endoscopy;  Laterality: N/A; . ESOPHAGOGASTRODUODENOSCOPY (EGD) WITH PROPOFOL N/A  11/23/2015  Procedure: ESOPHAGOGASTRODUODENOSCOPY (EGD) WITH PROPOFOL;  Surgeon: Willis Modena, MD;  Location: [facility] ENDOSCOPY;  Service: Endoscopy;  Laterality: N/A;  may need to intubate . EYE SURGERY    growth on right eye removed  . IR GENERIC HISTORICAL  12/13/2015  IR REPLC DUODEN/JEJUNO TUBE PERCUT W/FLUORO 12/13/2015 [name], MD [facility] . LAPAROSCOPIC GASTROSTOMY N/A 12/01/2015  Procedure: LAPAROSCOPIC PLACEMENT OF FEEDING JEJUNOSTOMY AND GASTROSTOMY TUBE;  Surgeon: Karie Soda, MD;  [facility]: [facility];  Service: General;  Laterality: N/A; . LAPAROSCOPY N/A 12/03/2015  Procedure: LAPAROSCOPY DIAGNOSTIC, OMENTOPEXY, JEJUNOSTOMY, WASH OUT;  Surgeon: Karie Soda, MD;  Location: [facility];  Service: General;  Laterality: N/A; . STENT TO HEART    2 1997 AND 1 IN 1996 . TONSILLECTOMY   . XI ROBOTIC VAGOTOMY AND ANTRECTOMY N/A 09/25/2015  Procedure: XI ROBOTIC ANTERIOR AND POSTERIOR VAGOTOMY, BILROTH I  ANASTOMOSIS DOR FUNDIPLICATION, OMENTOPEXY  UPPER ENDOSCOPY;  Surgeon: Karie Soda, MD;  Location: [facility];  Service: General;  Laterality: N/A; HPI: 75 yo male former smoker with progressive epigastric pain, nausea, vomiting, bloating from gastric outlet  obstruction with pyloric channel ulcer.  Had Billroth 1, vagotomy, fundoplication, omentopexy August 2017 with initial improvement.  Had recurrent symptoms September 2017 that become progressively worse with gastric dilation from recurrent stricture.  Had laparoscopic placement of jejunostomy tube, gastrostomy tube, and EGD balloon dilation of duodenal stricture 10/17.  Developed gastric leakage with peritonitis after pulling out PEG tube 10/19 and taken to OR. On 10/27 found 2 new LUQ abscesses/fluid collections which were drained percutaneously by IR. Had perc trach 10/27.  As of 10/30 his mental status has improved.  Pt underwent MBS and has been on TPN since due to level of dysphagia.  Ice chips have been consumed with improved tolerance.  MD ordered repeat  MBS.   Subjective: pt awake in bed, weak Assessment / Plan / Recommendation CHL IP CLINICAL IMPRESSIONS 12/29/2015 Therapy Diagnosis Moderate oral phase dysphagia;Moderate pharyngeal phase dysphagia;Moderate cervical esophageal phase dysphagia Clinical Impression Patient continues with moderate oropharyngeal dysphagia that appears marginally worse than prior MBS 12/17/15 results.  Swallow function continues to be significantly weak resulting in residuals and aspiration with poor cough mechanism.   Pt again fatigued during MBS and at times had difficulty keeping his eyes open.   Viscous green tinged secretions coughed through trach (blew off PMSV) after oral care and before barium administration.  Pt also with copious secretions coughed AROUND trach following MBS *pt had PMSV in place.   Gross weakness secretion retention that mixed with barium without consistent awareness.  Delayed oral transiting, lingual pumping and premature spillage of barium into pharynx noted due to weakness.  Compromised tongue base retraction and laryngeal elevation allowed laryngeal penetration and SILENT aspiration both during and after the swallow.  Cues to cough/throat clear and dry swallow did not fully clear penetrates and patient quickly re-penetrated pharyngeal residuals.  In addition patient appeared with decreased proximal esophageal opening/decreased CP opening resulting in residuals in pyriform sinus.  Unfortunately patient is at high risk of aspiration with intake at this time.  Using teach back/live video educated patient and daughter to findings and reinforced effective compensation strategies.   Recommend continue NPO except single ice chips with strict precautions - oral care, pmsv in place and cueing patient to cough/clear throat and re-swallow.  Pt also with increase in delayed responses to SLP instructions/cues today- and states "I'm weak".  Given pt's lack of swallow improvement, SlP is concerned for swallow prognosis  during acute stay.  Pt will need repeat MBS in future due to silent nature of dysphagia. Will follow up for dysphagia treatment/management.   Thanks for allowing me to help care for this patient.  Impact on safety and function Severe aspiration risk;Risk for inadequate nutrition/hydration   CHL IP TREATMENT RECOMMENDATION 12/29/2015 Treatment Recommendations F/U MBS in --- days (Comment)   Prognosis 12/29/2015 Prognosis for Safe Diet Advancement Guarded Barriers to Reach Goals Severity of deficits;Cognitive deficits Barriers/Prognosis Comment -- CHL IP DIET RECOMMENDATION 12/29/2015 SLP Diet Recommendations NPO;Ice chips PRN after oral care Liquid Administration via -- Medication Administration Via alternative means Compensations Minimize environmental distractions;Slow rate;Multiple dry swallows after each bite/sip;Hard cough after swallow Postural Changes --   CHL IP OTHER RECOMMENDATIONS 12/29/2015 Recommended Consults -- Oral Care Recommendations Oral care QID Other Recommendations --   CHL IP FOLLOW UP RECOMMENDATIONS 12/29/2015 Follow up Recommendations Skilled Nursing facility;Inpatient Rehab   CHL IP FREQUENCY AND DURATION 12/29/2015 Speech Therapy Frequency (ACUTE ONLY) min 2x/week Treatment Duration 2 weeks      CHL IP ORAL PHASE 12/29/2015 Oral Phase Impaired  Oral - Pudding Teaspoon -- Oral - Pudding Cup -- Oral - Honey Teaspoon -- Oral - Honey Cup -- Oral - Nectar Teaspoon -- Oral - Nectar Cup -- Oral - Nectar Straw -- Oral - Thin Teaspoon Weak lingual manipulation;Reduced posterior propulsion;Lingual/palatal residue;Decreased bolus cohesion;Delayed oral transit;Premature spillage Oral - Thin Cup NT Oral - Thin Straw NT Oral - Puree Weak lingual manipulation;Lingual/palatal residue;Reduced posterior propulsion;Premature spillage;Decreased bolus cohesion;Delayed oral transit Oral - Mech Soft -- Oral - Regular -- Oral - Multi-Consistency -- Oral - Pill -- Oral Phase - Comment --  CHL IP PHARYNGEAL PHASE  12/29/2015 Pharyngeal Phase Impaired Pharyngeal- Pudding Teaspoon -- Pharyngeal -- Pharyngeal- Pudding Cup -- Pharyngeal -- Pharyngeal- Honey Teaspoon -- Pharyngeal -- Pharyngeal- Honey Cup -- Pharyngeal -- Pharyngeal- Nectar Teaspoon Reduced pharyngeal peristalsis;Reduced epiglottic inversion;Reduced anterior laryngeal mobility;Reduced laryngeal elevation;Reduced airway/laryngeal closure;Reduced tongue base retraction;Penetration/Aspiration during swallow;Pharyngeal residue - valleculae;Lateral channel residue;Pharyngeal residue - cp segment Pharyngeal Material enters airway, remains ABOVE vocal cords and not ejected out Pharyngeal- Nectar Cup Delayed swallow initiation-pyriform sinuses;Reduced pharyngeal peristalsis;Reduced epiglottic inversion;Reduced anterior laryngeal mobility;Reduced laryngeal elevation;Reduced airway/laryngeal closure;Reduced tongue base retraction;Penetration/Aspiration during swallow;Penetration/Apiration after swallow;Significant aspiration (Amount);Pharyngeal residue - valleculae;Pharyngeal residue - pyriform;Lateral channel residue;Pharyngeal residue - cp segment Pharyngeal Material enters airway, passes BELOW cords without attempt by patient to eject out (silent aspiration);Material enters airway, passes BELOW cords and not ejected out despite cough attempt by patient Pharyngeal- Nectar Straw -- Pharyngeal -- Pharyngeal- Thin Teaspoon Delayed swallow initiation-vallecula;Reduced pharyngeal peristalsis;Reduced epiglottic inversion;Reduced anterior laryngeal mobility;Reduced laryngeal elevation;Reduced airway/laryngeal closure;Reduced tongue base retraction;Penetration/Aspiration during swallow;Penetration/Aspiration before swallow;Pharyngeal residue - valleculae;Lateral channel residue;Trace aspiration;Pharyngeal residue - cp segment Pharyngeal Material enters airway, passes BELOW cords without attempt by patient to eject out (silent aspiration) Pharyngeal- Thin Cup NT Pharyngeal --  Pharyngeal- Thin Straw NT Pharyngeal -- Pharyngeal- Puree Delayed swallow initiation-vallecula;Reduced epiglottic inversion;Reduced pharyngeal peristalsis;Reduced anterior laryngeal mobility;Reduced laryngeal elevation;Reduced airway/laryngeal closure;Reduced tongue base retraction;Pharyngeal residue - valleculae;Pharyngeal residue - pyriform Pharyngeal -- Pharyngeal- Mechanical Soft -- Pharyngeal -- Pharyngeal- Regular -- Pharyngeal -- Pharyngeal- Multi-consistency -- Pharyngeal -- Pharyngeal- Pill -- Pharyngeal -- Pharyngeal Comment pt continues with gross residuals and poor awareness, WEAK dry swallow/throat clear and cough were ineffective to fully clear aspiration/penetration/residuals, pt did have viscous colored secretions *green tinged* coughed and expectorated from trach prior to MBS and around the trach AFTER   CHL IP CERVICAL ESOPHAGEAL PHASE 12/29/2015 Cervical Esophageal Phase -- Pudding Teaspoon -- Pudding Cup -- Honey Teaspoon -- Honey Cup -- Nectar Teaspoon Prominent cricopharyngeal segment;Reduced cricopharyngeal relaxation Nectar Cup Prominent cricopharyngeal segment;Reduced cricopharyngeal relaxation Nectar Straw -- Thin Teaspoon Reduced cricopharyngeal relaxation;Prominent cricopharyngeal segment Thin Cup NT Thin Straw NT Puree Reduced cricopharyngeal relaxation;Prominent cricopharyngeal segment Mechanical Soft -- Regular -- Multi-consistency -- Pill -- Cervical Esophageal Comment -- No flowsheet data found. Donavan Burnet, MS [facility] SLP [phone]              Dg Swallowing Func-speech Pathology  Result Date: 12/17/2015 Objective Swallowing Evaluation: Type of Study: MBS-Modified Barium Swallow Study Patient Details Name: [name] MRN: [id number] Date of Birth: 1940-06-18 Today's Date: 12/17/2015 Time: SLP Start Time (ACUTE ONLY): 0925-SLP Stop Time (ACUTE ONLY): 0945 SLP Time Calculation (min) (ACUTE ONLY): 20 min Past Medical History: Past Medical History: Diagnosis Date . Bilateral dry  eyes  . Coronary artery disease 1997  bare mental stent right coronart artery . Degenerative disc disease   LOWER BACK . Dysphonia  . ED (erectile dysfunction)  . Gastroesophageal reflux disease  . Hyperlipidemia  .  Hypertension  . Peptic ulcer  . Pneumonia   HISTORY OF IN CHILDHOOD . Posterior vitreous detachment 10/30/2012 . Urinary hesitancy  . Wears glasses  Past Surgical History: Past Surgical History: Procedure Laterality Date . CARDIAC CATHETERIZATION   . CHOLECYSTECTOMY   . ESOPHAGOGASTRODUODENOSCOPY N/A 12/01/2015  Procedure: ESOPHAGOGASTRODUODENOSCOPY (EGD) BALLOON DILATION OF DUODENAL STRICTURE;  Surgeon: Karie Soda, MD;  Location: [facility];  Service: General;  Laterality: N/A; . ESOPHAGOGASTRODUODENOSCOPY N/A 12/14/2015  Procedure: ESOPHAGOGASTRODUODENOSCOPY (EGD);  Surgeon: Vida Rigger, MD;  Location: [facility];  Service: Endoscopy;  Laterality: N/A;  Patient has tracheostomy . ESOPHAGOGASTRODUODENOSCOPY (EGD) WITH PROPOFOL N/A 07/01/2015  Procedure: ESOPHAGOGASTRODUODENOSCOPY (EGD) WITH PROPOFOL;  Surgeon: Willis Modena, MD;  Location: [facility];  Service: Endoscopy;  Laterality: N/A; . ESOPHAGOGASTRODUODENOSCOPY (EGD) WITH PROPOFOL N/A 11/23/2015  Procedure: ESOPHAGOGASTRODUODENOSCOPY (EGD) WITH PROPOFOL;  Surgeon: Willis Modena, MD;  Location: [facility];  Service: Endoscopy;  Laterality: N/A;  may need to intubate . EYE SURGERY    growth on right eye removed  . IR GENERIC HISTORICAL  12/13/2015  IR REPLC DUODEN/JEJUNO TUBE PERCUT W/FLUORO 12/13/2015 [name], MD [facility]-INTER[facility] RAD . LAPAROSCOPIC GASTROSTOMY N/A 12/01/2015  Procedure: LAPAROSCOPIC PLACEMENT OF FEEDING JEJUNOSTOMY AND GASTROSTOMY TUBE;  Surgeon: Karie Soda, MD;  Location: [facility];  Service: General;  Laterality: N/A; . LAPAROSCOPY N/A 12/03/2015  Procedure: LAPAROSCOPY DIAGNOSTIC, OMENTOPEXY, JEJUNOSTOMY, WASH OUT;  Surgeon: Karie Soda, MD;  Location: [facility];  Service: General;  Laterality: N/A; . STENT TO HEART    2 1997 AND 1  IN 1996 . TONSILLECTOMY   . XI ROBOTIC VAGOTOMY AND ANTRECTOMY N/A 09/25/2015  Procedure: XI ROBOTIC ANTERIOR AND POSTERIOR VAGOTOMY, BILROTH I  ANASTOMOSIS DOR FUNDIPLICATION, OMENTOPEXY  UPPER ENDOSCOPY;  Surgeon: Karie Soda, MD;  Location: [facility];  Service: General;  Laterality: N/A; HPI: 75 yo male former smoker with progressive epigastric pain, nausea, vomiting, bloating from gastric outlet obstruction with pyloric channel ulcer.  Had Billroth 1, vagotomy, fundoplication, omentopexy August 2017 with initial improvement.  Had recurrent symptoms September 2017 that become progressively worse with gastric dilation from recurrent stricture.  Had laparoscopic placement of jejunostomy tube, gastrostomy tube, and EGD balloon dilation of duodenal stricture 10/17.  Developed gastric leakage with peritonitis after pulling out PEG tube 10/19 and taken to OR. On 10/27 found 2 new LUQ abscesses/fluid collections which were drained percutaneously by IR. Had perc trach 10/27.  As of 10/30 his mental status has improved, he has remained off of vasopressors for several days and he has done well with weaning off the ventilator per MD note.  Subjective: pt awake in bed Assessment / Plan / Recommendation CHL IP CLINICAL IMPRESSIONS 12/17/2015 Therapy Diagnosis Moderate oral phase dysphagia;Moderate pharyngeal phase dysphagia;Moderate cervical esophageal phase dysphagia Clinical Impression Patient presents with moderate oropharyngeal dysphagia characterized by significant weakness resulting in residuals and aspiration.  Pt was only given minimal amount of thin and nectar barium during testing.  Of note, after patient was transferred to flouro chair, he demonstrated copious coughing.  He cleared scant secretions through trach tube at that time.  Transferring to chair and coughing episode significantly fatigued patient.  PMSV was placed for MBS with good tolerance during testing.  Unfortunately patient is grossly weak resulting in  oropharyngeal deficits and secretion retention that mixed with barium.  Delayed oral transiting, lingual pumping and premature spillage of barium into pharynx noted due to weakness.  Compromised tongue base retraction and laryngeal elevation allowed laryngeal penetration and SILENT aspiration both during and after the swallow.  Cues to cough/throat clear and dry swallow did not fully clear penetrates and patient quickly re-penetrated pharyngeal residuals.  In addition patient appeared with decreased proximal esophageal opening/decreased CP opening resulting in residuals in pyriform sinus.  Unfortunately patient is at high risk of aspiration with intake at this time.  Using teach back/live video educated patient to findings and reinforced effective compensation strategies.   Recommend continue NPO except single ice chips with strict precautions - oral care, pmsv in place and cueing patient to cough/clear throat and re-swallow.  Pt will need repeat MBS in future due to silent nature of dysphagia. Will follow up for dysphagia treatment/management.   Thanks for allowing me to help care for this patient.  Impact on safety and function Severe aspiration risk;Risk for inadequate nutrition/hydration   CHL IP TREATMENT RECOMMENDATION 12/17/2015 Treatment Recommendations F/U MBS in --- days (Comment)   Prognosis 12/17/2015 Prognosis for Safe Diet Advancement Fair Barriers to Reach Goals Severity of deficits;Cognitive deficits Barriers/Prognosis Comment -- CHL IP DIET RECOMMENDATION 12/17/2015 SLP Diet Recommendations NPO;Ice chips PRN after oral care Liquid Administration via -- Medication Administration Via alternative means Compensations Minimize environmental distractions;Slow rate;Multiple dry swallows after each bite/sip;Hard cough after swallow Postural Changes --   CHL IP OTHER RECOMMENDATIONS 12/17/2015 Recommended Consults -- Oral Care Recommendations Oral care QID Other Recommendations --   CHL IP FOLLOW UP  RECOMMENDATIONS 12/17/2015 Follow up Recommendations Skilled Nursing facility;Inpatient Rehab   CHL IP FREQUENCY AND DURATION 12/17/2015 Speech Therapy Frequency (ACUTE ONLY) min 2x/week Treatment Duration 2 weeks      CHL IP ORAL PHASE 12/17/2015 Oral Phase Impaired Oral - Pudding Teaspoon -- Oral - Pudding Cup -- Oral - Honey Teaspoon -- Oral - Honey Cup -- Oral - Nectar Teaspoon -- Oral - Nectar Cup Weak lingual manipulation;Reduced posterior propulsion;Delayed oral transit;Decreased bolus cohesion;Lingual pumping Oral - Nectar Straw -- Oral - Thin Teaspoon Weak lingual manipulation;Delayed oral transit;Decreased bolus cohesion;Reduced posterior propulsion;Lingual pumping Oral - Thin Cup Weak lingual manipulation;Reduced posterior propulsion;Delayed oral transit;Decreased bolus cohesion;Lingual pumping Oral - Thin Straw Weak lingual manipulation;Decreased bolus cohesion;Delayed oral transit;Reduced posterior propulsion;Lingual pumping Oral - Puree -- Oral - Mech Soft -- Oral - Regular -- Oral - Multi-Consistency -- Oral - Pill -- Oral Phase - Comment --  CHL IP PHARYNGEAL PHASE 12/17/2015 Pharyngeal Phase Impaired Pharyngeal- Pudding Teaspoon -- Pharyngeal -- Pharyngeal- Pudding Cup -- Pharyngeal -- Pharyngeal- Honey Teaspoon -- Pharyngeal -- Pharyngeal- Honey Cup -- Pharyngeal -- Pharyngeal- Nectar Teaspoon Reduced laryngeal elevation;Pharyngeal residue - pyriform;Pharyngeal residue - valleculae;Reduced tongue base retraction;Reduced airway/laryngeal closure;Reduced epiglottic inversion;Penetration/Aspiration during swallow Pharyngeal Material enters airway, passes BELOW cords without attempt by patient to eject out (silent aspiration) Pharyngeal- Nectar Cup Reduced laryngeal elevation;Reduced tongue base retraction;Reduced anterior laryngeal mobility;Reduced epiglottic inversion;Reduced airway/laryngeal closure;Penetration/Aspiration during swallow Pharyngeal Material enters airway, passes BELOW cords without  attempt by patient to eject out (silent aspiration) Pharyngeal- Nectar Straw -- Pharyngeal -- Pharyngeal- Thin Teaspoon Reduced laryngeal elevation;Reduced airway/laryngeal closure;Reduced tongue base retraction;Penetration/Aspiration during swallow;Reduced epiglottic inversion;Trace aspiration;Pharyngeal residue - valleculae;Pharyngeal residue - pyriform;Pharyngeal residue - cp segment Pharyngeal Material enters airway, passes BELOW cords without attempt by patient to eject out (silent aspiration) Pharyngeal- Thin Cup Moderate aspiration;Penetration/Aspiration during swallow;Penetration/Apiration after swallow;Reduced pharyngeal peristalsis;Reduced epiglottic inversion;Reduced laryngeal elevation;Reduced airway/laryngeal closure;Reduced tongue base retraction Pharyngeal Material enters airway, passes BELOW cords without attempt by patient to eject out (silent aspiration) Pharyngeal- Thin Straw Reduced pharyngeal peristalsis;Reduced epiglottic inversion;Reduced anterior laryngeal mobility;Reduced laryngeal elevation;Reduced airway/laryngeal closure;Pharyngeal residue - valleculae;Pharyngeal residue - pyriform;Moderate aspiration;Penetration/Aspiration during  swallow Pharyngeal Material enters airway, passes BELOW cords without attempt by patient to eject out (silent aspiration) Pharyngeal- Puree -- Pharyngeal -- Pharyngeal- Mechanical Soft -- Pharyngeal -- Pharyngeal- Regular -- Pharyngeal -- Pharyngeal- Multi-consistency -- Pharyngeal -- Pharyngeal- Pill -- Pharyngeal -- Pharyngeal Comment pt without awareness to residuals, penetration and aspiration did not fully clear despite cued cough, pt also with aspiration of secretions mixed with barium  CHL IP CERVICAL ESOPHAGEAL PHASE 12/17/2015 Cervical Esophageal Phase Impaired Pudding Teaspoon -- Pudding Cup -- Honey Teaspoon -- Honey Cup -- Nectar Teaspoon Reduced cricopharyngeal relaxation;Prominent cricopharyngeal segment Nectar Cup Reduced cricopharyngeal  relaxation;Prominent cricopharyngeal segment Nectar Straw -- Thin Teaspoon Reduced cricopharyngeal relaxation;Prominent cricopharyngeal segment Thin Cup Reduced cricopharyngeal relaxation;Prominent cricopharyngeal segment Thin Straw Reduced cricopharyngeal relaxation;Prominent cricopharyngeal segment Puree -- Mechanical Soft -- Regular -- Multi-consistency -- Pill -- Cervical Esophageal Comment pt appeared with tight UES  Donavan Burnet, MS [facility] SLP [phone]               Assessment and Plan  TPN - today I called [facility] and spoke to the pharmacist about pt's TPN - specifically how to relate TPN osmolarity with pt's osmolality to determine how much free water he should be getting; pt is getting 1500 mosm/liter, 500 cc free water and 2.2 liters of TPN q 24. She looked it up but could not tell me  how it relates to pt's osmolality on his BMP.Pt is tolerating TPN well and gaining strength from it  HYPERNATREMIA /PEG TUBE/ METABOLIC ACIDOSIS- pt is starting Na acetate in TPN to address the hyperchloremia and acidosis; also pt's osmolyte will start this evening; labs today - Na+ 138, K+ 3.8, Cl 109, CO2  17, BUN 22.6/Cr 0.97; plan to decrease free water to 750 cc daily and start K+ regularly 20 meq  Per tube  ABDOMINAL WOUNDS/BUTTOCKS ULCER - j TUBE SITE IS ALMOST HEALED AS IS UNSTAGEALE BUTTOCKS; G tube site looks much better    Labs/tests ordered: bmp  Time spent > 35 min;> 50% of time with patient was spent reviewing records, labs, tests and studies, counseling and developing plan of care  Thurston Hole D. [name], MD

## 2016-01-14 ENCOUNTER — Encounter: Payer: Self-pay | Admitting: Internal Medicine

## 2016-01-14 ENCOUNTER — Non-Acute Institutional Stay (SKILLED_NURSING_FACILITY): Payer: Medicare Other | Admitting: Internal Medicine

## 2016-01-14 DIAGNOSIS — E872 Acidosis, unspecified: Secondary | ICD-10-CM

## 2016-01-14 DIAGNOSIS — Z931 Gastrostomy status: Secondary | ICD-10-CM

## 2016-01-14 DIAGNOSIS — E870 Hyperosmolality and hypernatremia: Secondary | ICD-10-CM

## 2016-01-14 DIAGNOSIS — Z789 Other specified health status: Secondary | ICD-10-CM

## 2016-01-14 LAB — BASIC METABOLIC PANEL
BUN: 23 mg/dL — AB (ref 4–21)
Creatinine: 0.8 mg/dL (ref 0.6–1.3)
GLUCOSE: 139 mg/dL
POTASSIUM: 3.8 mmol/L (ref 3.4–5.3)
Sodium: 139 mmol/L (ref 137–147)

## 2016-01-14 LAB — CBC AND DIFFERENTIAL
HCT: 35 % — AB (ref 41–53)
HEMOGLOBIN: 11.2 g/dL — AB (ref 13.5–17.5)
PLATELETS: 290 10*3/uL (ref 150–399)
WBC: 8 10*3/mL

## 2016-01-14 NOTE — Progress Notes (Signed)
Location:  [facility] Room Number: [id number] Place of Service:  SNF [phone])  [name]. [name], MD  Patient Care Team: [name], MD as PCP - General (Internal Medicine) Karie Soda, MD as Consulting Physician (General Surgery) Willis Modena, MD as Consulting Physician (Gastroenterology) Coralyn Helling, MD as Consulting Physician (Pulmonary Disease)  Extended Emergency Contact Information Primary Emergency Contact: [name] Address: [address]          [place], [place] [address] [name] Home Phone: [phone] Mobile Phone: [phone] Relation: Spouse Secondary Emergency Contact: [name] [place] of [place] Mobile Phone: [phone] Relation: Daughter    Allergies: Flomax [tamsulosin hcl]; Lisinopril; Lorazepam; and Uroxatral [alfuzosin hcl er]  Chief Complaint  Patient presents with  . Acute Visit    Acute    HPI: Patient is 75 y.o. male who is being seen in f/u for ongoing issues of feeding including TPN, and G tube as well as electrolyte and metabolic disturbances. Pt admits he has 4 small volume diarrhea after his evening osmolyte, first can but says he hadn't had a BM for several days. He has had the second can of osmolyte n him for about 2 hours with no diarrhea so far. Diarrhea was a major problem with the large volumes of jevity pt came in on. Pt is still improving in strength slowly and is working with OT/PT and ST actively.  Past Medical History:  Diagnosis Date  . Bilateral dry eyes   . BPH (benign prostatic hyperplasia)   . Central pterygium of right eye   . Coronary artery disease 1997, 2009   bare mental stent right coronart artery, occlusive followed by Dr. Garnette Scheuermann in [place]  . Degenerative disc disease    LOWER BACK  . Dysphonia   . ED (erectile dysfunction)   . Gastroesophageal reflux disease   . Hyperlipidemia   . Hyperopia of both eyes with astigmatism and presbyopia     . Hypertension   . Nuclear senile cataract    Dr. [facility] in [place]  . Peptic ulcer   . Pneumonia    HISTORY OF IN CHILDHOOD  . Posterior vitreous detachment 10/30/2012  . Salzmann's nodular dystrophy of left eye   . Urinary hesitancy   . Wears glasses     Past Surgical History:  Procedure Laterality Date  . CARDIAC CATHETERIZATION    . CHOLECYSTECTOMY    . ESOPHAGOGASTRODUODENOSCOPY N/A 12/01/2015   Procedure: ESOPHAGOGASTRODUODENOSCOPY (EGD) BALLOON DILATION OF DUODENAL STRICTURE;  Surgeon: Karie Soda, MD;  Location: [facility];  Service: General;  Laterality: N/A;  . ESOPHAGOGASTRODUODENOSCOPY N/A 12/14/2015   Procedure: ESOPHAGOGASTRODUODENOSCOPY (EGD);  Surgeon: Vida Rigger, MD;  Location: [facility];  Service: Endoscopy;  Laterality: N/A;  Patient has tracheostomy  . ESOPHAGOGASTRODUODENOSCOPY (EGD) WITH PROPOFOL N/A 07/01/2015   Procedure: ESOPHAGOGASTRODUODENOSCOPY (EGD) WITH PROPOFOL;  Surgeon: Willis Modena, MD;  [facility]: [facility];  Service: Endoscopy;  Laterality: N/A;  . ESOPHAGOGASTRODUODENOSCOPY (EGD) WITH PROPOFOL N/A 11/23/2015   Procedure: ESOPHAGOGASTRODUODENOSCOPY (EGD) WITH PROPOFOL;  Surgeon: Willis Modena, MD;  Location: [facility] ENDOSCOPY;  Service: Endoscopy;  Laterality: N/A;  may need to intubate  . EYE SURGERY     growth on right eye removed   . IR GENERIC HISTORICAL  12/13/2015   IR REPLC DUODEN/JEJUNO TUBE PERCUT W/FLUORO 12/13/2015 [name], MD [facility]  . LAPAROSCOPIC GASTROSTOMY N/A 12/01/2015   Procedure: LAPAROSCOPIC PLACEMENT OF FEEDING JEJUNOSTOMY AND GASTROSTOMY TUBE;  Surgeon: Karie Soda, MD;  Location: [facility];  Service: General;  Laterality: N/A;  . LAPAROSCOPY N/A 12/03/2015   Procedure: LAPAROSCOPY DIAGNOSTIC, OMENTOPEXY, JEJUNOSTOMY, WASH OUT;  Surgeon: Karie Soda, MD;  Location: [facility];  Service: General;  Laterality: N/A;  . STENT TO HEART     2 1997 AND 1 IN 1996  . TONSILLECTOMY    . XI ROBOTIC VAGOTOMY  AND ANTRECTOMY N/A 09/25/2015   Procedure: XI ROBOTIC ANTERIOR AND POSTERIOR VAGOTOMY, BILROTH I  ANASTOMOSIS DOR FUNDIPLICATION, OMENTOPEXY  UPPER ENDOSCOPY;  Surgeon: Karie Soda, MD;  Location: [facility];  Service: General;  Laterality: N/A;      Medication List       Accurate as of 01/14/16 11:59 PM. Always use your most recent med list.          aluminum-magnesium hydroxide-simethicone 200-200-20 MG/5ML Susp Commonly known as:  MAALOX Take 30 mLs by mouth every 6 (six) hours as needed.   aspirin 81 MG chewable tablet Place 81 mg into feeding tube every evening.   atorvastatin 40 MG tablet Commonly known as:  LIPITOR Place 40 mg into feeding tube at bedtime.   bisacodyl 10 MG suppository Commonly known as:  DULCOLAX Place 10 mg rectally daily as needed for moderate constipation or severe constipation.   bismuth subsalicylate 262 MG/15ML suspension Commonly known as:  PEPTO BISMOL Place 30 mLs into feeding tube every 8 (eight) hours as needed for indigestion or diarrhea or loose stools.   calcium-vitamin D 500-200 MG-UNIT tablet Commonly known as:  OSCAL WITH D Place 1 tablet into feeding tube daily with breakfast.   CENTAMIN Liqd Place 10 mLs into feeding tube daily before breakfast.   MULTIVITAMIN & MINERAL Liqd Place 10 mLs into feeding tube daily before breakfast.   feeding supplement (OSMOLITE 1.2 CAL) Liqd Place 237 mLs into feeding tube. Give 1 can via G-tube every day at  10 am and  6 pm.   feeding supplement (PRO-STAT SUGAR FREE 64) Liqd Place 30 mLs into feeding tube daily.   Ferrous Sulfate 220 (44 Fe) MG/5ML Liqd Place 7.5 mLs into feeding tube 3 (three) times daily.   FLUoxetine 20 MG tablet Commonly known as:  PROZAC Place 20 mg into feeding tube daily.   haloperidol 0.5 MG tablet Commonly known as:  HALDOL Place 0.25 mg into feeding tube 2 (two) times daily. At 4 pm   haloperidol 0.5 MG tablet Commonly known as:  HALDOL Take 0.5 mg by mouth  2 (two) times daily. At 6 am and 4:30 pm.   haloperidol 0.5 MG tablet Commonly known as:  HALDOL Place 0.5 mg into feeding tube as needed for agitation. STOP DATE 01/24/2016   HYDROcodone-acetaminophen 5-325 MG tablet Commonly known as:  NORCO/VICODIN Place 1-2 tablets into feeding tube every 6 (six) hours as needed for moderate pain or severe pain.   loperamide 1 MG/5ML solution Commonly known as:  IMODIUM 10 mg. Take 10 mls via tube every 8 hours as needed for more than 2 bms every 8 hours.   magic mouthwash Soln Take 15 mLs by mouth 4 (four) times daily as needed for mouth pain (sore throat).   metoCLOPramide 5 MG/5ML solution Commonly known as:  REGLAN Place 10 mg into feeding tube every 6 (six) hours as needed for nausea or vomiting.   metoprolol tartrate 25 MG tablet Commonly known as:  LOPRESSOR Place 25 mg into feeding tube daily.   metoprolol tartrate 25 MG tablet Commonly known as:  LOPRESSOR Place 0.5 tablets (12.5 mg  total) into feeding tube daily.   nicotine 7 mg/24hr patch Commonly known as:  NICODERM CQ - dosed in mg/24 hr Place 7 mg onto the skin daily. Rotate site   nitroGLYCERIN 0.4 MG SL tablet Commonly known as:  NITROSTAT Place 0.4 mg under the tongue every 5 (five) minutes as needed for chest pain.   omeprazole 40 MG capsule Commonly known as:  PRILOSEC Take 40 mg by mouth 2 (two) times daily.   REFRESH OP Place 2 drops into both eyes as needed (For dry eyes.).   RESTASIS 0.05 % ophthalmic emulsion Generic drug:  cycloSPORINE Place 1 drop into both eyes 2 (two) times daily.   sterile water for irrigation Irrigate with 250 mLs as directed every 6 (six) hours.   traZODone 50 MG tablet Commonly known as:  DESYREL Place 50 mg into feeding tube at bedtime.   Vitamin B-12 1000 MCG Subl Place 1 tablet under the tongue daily with breakfast.       Meds ordered this encounter  Medications  . haloperidol (HALDOL) 0.5 MG tablet    Sig: Take  0.5 mg by mouth 2 (two) times daily. At 6 am and 4:30 pm.  . Ferrous Sulfate 220 (44 Fe) MG/5ML LIQD    Sig: Place 7.5 mLs into feeding tube 3 (three) times daily.  . Nutritional Supplements (FEEDING SUPPLEMENT, OSMOLITE 1.2 CAL,) LIQD    Sig: Place 237 mLs into feeding tube. Give 1 can via G-tube every day at  10 am and  6 pm.    Immunization History  Administered Date(s) Administered  . PPD Test 01/13/2016    Social History  Substance Use Topics  . Smoking status: Former Smoker    Packs/day: 2.00    Years: 20.00    Types: Cigarettes    Quit date: 02/15/1995  . Smokeless tobacco: Never Used  . Alcohol use No    Review of Systems  DATA OBTAINED: from patient GENERAL:  no fevers, fatigue, appetite changes SKIN: No itching, rash HEENT: No complaint RESPIRATORY: No cough, wheezing, SOB CARDIAC: No chest pain, palpitations, lower extremity edema  GI: No abdominal pain, No N/V/ some diarrhea as per HPI, no constipation, No heartburn or reflux  GU: No dysuria, frequency or urgency, or incontinence  MUSCULOSKELETAL: No unrelieved bone/joint pain NEUROLOGIC: No headache, dizziness  PSYCHIATRIC: No overt anxiety or sadness  Vitals:   01/14/16 1335  BP: 122/62  Pulse: (!) 53  Resp: 20  Temp: 97 F (36.1 C)   Body mass index is 21.31 kg/m. Physical Exam  GENERAL APPEARANCE: Alert, conversant, No acute distress  SKIN: No diaphoresis rash HEENT: Unremarkable RESPIRATORY: Breathing is even, unlabored. Lung sounds are clear   CARDIOVASCULAR: Heart RRR no murmurs, rubs or gallops. No peripheral edema  GASTROINTESTINAL: Abdomen is soft, non-tender, not distended w/ normal bowel sounds: G tube  GENITOURINARY: Bladder non tender, not distended  MUSCULOSKELETAL: No abnormal joints or musculature NEUROLOGIC: Cranial nerves 2-12 grossly intact. Moves all extremities PSYCHIATRIC: Mood and affect appropriate to situation, no behavioral issues  Patient Active Problem List    Diagnosis Date Noted  . 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 Vitamin B12 deficiency 01/02/2016  . BPH (benign prostatic hyperplasia)   . Posterior vitreous detachment   . Salzmann's nodular dystrophy of left eye   . Hyperopia of both eyes with astigmatism and presbyopia   . Central pterygium of right eye   . Pressure injury of skin 12/30/2015  . Hypernatremia 12/28/2015  . Tracheostomy  in place [facility]) 12/25/2015  . Pneumatosis of intestines 12/11/2015  . Tobacco abuse 12/09/2015  . Ischemic right index finger at tip 12/09/2015  . Gastritis 12/09/2015  . Gastric tube present (LUQ) 12/09/2015  . Jejunostomy tube present (LLQ) 12/09/2015  . Acute respiratory failure with hypoxemia (HCC)   . Protein-calorie malnutrition, severe 12/04/2015  . Encephalopathy acute 12/03/2015  . Bile peritonitis due to gastric tube dislodgement 12/03/2015  . Gastric Ileus  11/25/2015  . Gastric outlet obstruction 11/24/2015  . Pyloric stricture 11/24/2015  . Anxiety state 09/28/2015  . Hyperlipidemia   . 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 Coronary atherosclerosis of native coronary artery 04/24/2013  . Essential hypertension, benign 04/24/2013  . Other and unspecified hyperlipidemia 04/24/2013  . Central pterygium 10/30/2012  . 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 Cataract, nuclear sclerotic senile 10/30/2012    CMP     Component Value Date/Time   NA 139 01/14/2016   K 3.8 01/14/2016   CL 123 (H) 12/29/2015 0900   CO2 23 12/29/2015 0900   GLUCOSE 117 (H) 12/29/2015 0900   BUN 23 (A) 01/14/2016   CREATININE 0.8 01/14/2016   CREATININE 1.44 (H) 12/29/2015 0900   CALCIUM 7.5 (L) 12/29/2015 0900   PROT 5.8 (L) 12/28/2015 0419   ALBUMIN 1.6 (L) 12/28/2015 0419   AST 42 (H) 12/28/2015 0419   ALT 51 12/28/2015 0419   ALKPHOS 340 (H) 12/28/2015 0419   BILITOT 0.8 12/28/2015 0419   GFRNONAA 46 (L)  12/29/2015 0900   GFRAA 53 (L) 12/29/2015 0900    Recent Labs  12/21/15 0530  12/24/15 0435  12/27/15 0348  12/28/15 0419 12/29/15 12/29/15 0900 01/14/16  NA 147*  < > 152*  < > 155*  < > 154* 153* 153* 139  K 3.5  < > 3.8  < > 3.7  < > 3.7  --  3.8 3.8  CL 115*  < > 121*  < > 125*  --  124*  --  123*  --   CO2 24  < > 23  < > 24  --  24  --  23  --   GLUCOSE 118*  < > 140*  < > 133*  --  132*  --  117*  --   BUN 40*  < > 58*  < > 79*  < > 69* 63* 63* 23*  CREATININE 1.06  < > 1.19  < > 1.33*  < > 1.25* 1.4* 1.44* 0.8  CALCIUM 8.4*  < > 8.0*  < > 7.3*  --  7.3*  --  7.5*  --   MG 2.0  --  2.2  --   --   --  2.1  --   --   --   PHOS 4.3  --  3.4  --   --   --  2.8  --   --   --   < > = values in this interval not displayed.  Recent Labs  12/21/15 0530 12/24/15 0435 12/28/15 0419  AST 31 43* 42*  ALT 28 44 51  ALKPHOS 212* 305* 340*  BILITOT 1.4* 1.1 0.8  PROT 6.1* 6.2* 5.8*  ALBUMIN 1.6* 1.6* 1.6*    Recent Labs  12/14/15 0549 12/15/15 0424 12/18/15 1159 12/21/15 0530 12/29/15 12/29/15 0900 01/14/16  WBC 7.7 10.2 10.6* 8.9 15.3 15.3* 8.0  NEUTROABS 5.1 7.0  --  6.1  --   --   --  HGB 10.3* 8.8* 8.6* 8.4*  --  7.8* 11.2*  HCT 31.0* 26.4* 26.0* 25.9*  --  24.7* 35*  MCV 88.8 89.2 87.5 89.0  --  89.8  --   PLT 516* 633* 573* 473*  --  509* 290    Recent Labs  12/14/15 0549 12/21/15 0530 01/11/16  TRIG 194* 199* 177*   No results found for: MICROALBUR No results found for: TSH No results found for: HGBA1C Lab Results  Component Value Date   TRIG 177 (A) 01/11/2016    Significant Diagnostic Results in last 30 days:  Dg Abd 1 View  Result Date: 12/18/2015 CLINICAL DATA:  Jejunostomy tube replacement. EXAM: ABDOMEN - 1 VIEW COMPARISON:  Procedural spot fluoroscopic 12/13/2015. CT abdomen and pelvis 12/10/2015. FINDINGS: Percutaneous jejunostomy tube overlies the left mid abdomen. Injected contrast opacifies an adjacent small bowel loop. A small amount of  contrast is partially visualized in the left upper quadrant in the expected region of the GE junction and may be within the distal esophagus and stomach. Abdominal surgical clips are present. Gas is present in grossly nondilated loops of small and large bowel to the level of the rectum without evidence of obstruction. IMPRESSION: Satisfactory positioning of percutaneous jejunostomy tube based on appearance of injected contrast material. Electronically Signed   By: Sebastian Ache M.D.   On: 12/18/2015 10:13   Dg Swallowing Func-speech Pathology  Result Date: 12/29/2015 Objective Swallowing Evaluation: Type of Study: MBS-Modified Barium Swallow Study Patient Details Name: TALIN FEISTER MRN: [id number] Date of Birth: February 18, 1940 Today's Date: 12/29/2015 Time: SLP Start Time (ACUTE ONLY): 0905-SLP Stop Time (ACUTE ONLY): 0940 SLP Time Calculation (min) (ACUTE ONLY): 35 min Past Medical History: Past Medical History: Diagnosis Date . Bilateral dry eyes  . Coronary artery disease 1997  bare mental stent right coronart artery . Degenerative disc disease   LOWER BACK . Dysphonia  . ED (erectile dysfunction)  . Gastroesophageal reflux disease  . Hyperlipidemia  . Hypertension  . Peptic ulcer  . Pneumonia   HISTORY OF IN CHILDHOOD . Posterior vitreous detachment 10/30/2012 . Urinary hesitancy  . Wears glasses  Past Surgical History: Past Surgical History: Procedure Laterality Date . CARDIAC CATHETERIZATION   . CHOLECYSTECTOMY   . ESOPHAGOGASTRODUODENOSCOPY N/A 12/01/2015  Procedure: ESOPHAGOGASTRODUODENOSCOPY (EGD) BALLOON DILATION OF DUODENAL STRICTURE;  Surgeon: Karie Soda, MD;  Location: [facility];  Service: General;  Laterality: N/A; . ESOPHAGOGASTRODUODENOSCOPY N/A 12/14/2015  Procedure: ESOPHAGOGASTRODUODENOSCOPY (EGD);  Surgeon: Vida Rigger, MD;  Location: [facility];  Service: Endoscopy;  Laterality: N/A;  Patient has tracheostomy . ESOPHAGOGASTRODUODENOSCOPY (EGD) WITH PROPOFOL N/A 07/01/2015  Procedure:  ESOPHAGOGASTRODUODENOSCOPY (EGD) WITH PROPOFOL;  Surgeon: Willis Modena, MD;  [facility];  Service: Endoscopy;  Laterality: N/A; . ESOPHAGOGASTRODUODENOSCOPY (EGD) WITH PROPOFOL N/A 11/23/2015  Procedure: ESOPHAGOGASTRODUODENOSCOPY (EGD) WITH PROPOFOL;  Surgeon: Willis Modena, MD;  Location: [facility] ENDOSCOPY;  Service: Endoscopy;  Laterality: N/A;  may need to intubate . EYE SURGERY    growth on right eye removed  . IR GENERIC HISTORICAL  12/13/2015  IR REPLC DUODEN/JEJUNO TUBE PERCUT W/FLUORO 12/13/2015 [name], MD [facility] . LAPAROSCOPIC GASTROSTOMY N/A 12/01/2015  Procedure: LAPAROSCOPIC PLACEMENT OF FEEDING JEJUNOSTOMY AND GASTROSTOMY TUBE;  Surgeon: Karie Soda, MD;  Location: [facility];  Service: General;  Laterality: N/A; . LAPAROSCOPY N/A 12/03/2015  Procedure: LAPAROSCOPY DIAGNOSTIC, OMENTOPEXY, JEJUNOSTOMY, WASH OUT;  Surgeon: Karie Soda, MD;  Location: [facility];  Service: General;  Laterality: N/A; . STENT TO HEART    2 1997 AND 1  IN 1996 . TONSILLECTOMY   . XI ROBOTIC VAGOTOMY AND ANTRECTOMY N/A 09/25/2015  Procedure: XI ROBOTIC ANTERIOR AND POSTERIOR VAGOTOMY, BILROTH I  ANASTOMOSIS DOR FUNDIPLICATION, OMENTOPEXY  UPPER ENDOSCOPY;  Surgeon: Karie Soda, MD;  Location: [facility];  Service: General;  Laterality: N/A; HPI: 75 yo male former smoker with progressive epigastric pain, nausea, vomiting, bloating from gastric outlet obstruction with pyloric channel ulcer.  Had Billroth 1, vagotomy, fundoplication, omentopexy August 2017 with initial improvement.  Had recurrent symptoms September 2017 that become progressively worse with gastric dilation from recurrent stricture.  Had laparoscopic placement of jejunostomy tube, gastrostomy tube, and EGD balloon dilation of duodenal stricture 10/17.  Developed gastric leakage with peritonitis after pulling out PEG tube 10/19 and taken to OR. On 10/27 found 2 new LUQ abscesses/fluid collections which were drained percutaneously by IR. Had perc trach  10/27.  As of 10/30 his mental status has improved.  Pt underwent MBS and has been on TPN since due to level of dysphagia.  Ice chips have been consumed with improved tolerance.  MD ordered repeat MBS.   Subjective: pt awake in bed, weak Assessment / Plan / Recommendation CHL IP CLINICAL IMPRESSIONS 12/29/2015 Therapy Diagnosis Moderate oral phase dysphagia;Moderate pharyngeal phase dysphagia;Moderate cervical esophageal phase dysphagia Clinical Impression Patient continues with moderate oropharyngeal dysphagia that appears marginally worse than prior MBS 12/17/15 results.  Swallow function continues to be significantly weak resulting in residuals and aspiration with poor cough mechanism.   Pt again fatigued during MBS and at times had difficulty keeping his eyes open.   Viscous green tinged secretions coughed through trach (blew off PMSV) after oral care and before barium administration.  Pt also with copious secretions coughed AROUND trach following MBS *pt had PMSV in place.   Gross weakness secretion retention that mixed with barium without consistent awareness.  Delayed oral transiting, lingual pumping and premature spillage of barium into pharynx noted due to weakness.  Compromised tongue base retraction and laryngeal elevation allowed laryngeal penetration and SILENT aspiration both during and after the swallow.  Cues to cough/throat clear and dry swallow did not fully clear penetrates and patient quickly re-penetrated pharyngeal residuals.  In addition patient appeared with decreased proximal esophageal opening/decreased CP opening resulting in residuals in pyriform sinus.  Unfortunately patient is at high risk of aspiration with intake at this time.  Using teach back/live video educated patient and daughter to findings and reinforced effective compensation strategies.   Recommend continue NPO except single ice chips with strict precautions - oral care, pmsv in place and cueing patient to cough/clear throat  and re-swallow.  Pt also with increase in delayed responses to SLP instructions/cues today- and states "I'm weak".  Given pt's lack of swallow improvement, SlP is concerned for swallow prognosis during acute stay.  Pt will need repeat MBS in future due to silent nature of dysphagia. Will follow up for dysphagia treatment/management.   Thanks for allowing me to help care for this patient.  Impact on safety and function Severe aspiration risk;Risk for inadequate nutrition/hydration   CHL IP TREATMENT RECOMMENDATION 12/29/2015 Treatment Recommendations F/U MBS in --- days (Comment)   Prognosis 12/29/2015 Prognosis for Safe Diet Advancement Guarded Barriers to Reach Goals Severity of deficits;Cognitive deficits Barriers/Prognosis Comment -- CHL IP DIET RECOMMENDATION 12/29/2015 SLP Diet Recommendations NPO;Ice chips PRN after oral care Liquid Administration via -- Medication Administration Via alternative means Compensations Minimize environmental distractions;Slow rate;Multiple dry swallows after each bite/sip;Hard cough after swallow Postural Changes --   CHL  IP OTHER RECOMMENDATIONS 12/29/2015 Recommended Consults -- Oral Care Recommendations Oral care QID Other Recommendations --   CHL IP FOLLOW UP RECOMMENDATIONS 12/29/2015 Follow up Recommendations Skilled Nursing facility;Inpatient Rehab   CHL IP FREQUENCY AND DURATION 12/29/2015 Speech Therapy Frequency (ACUTE ONLY) min 2x/week Treatment Duration 2 weeks      CHL IP ORAL PHASE 12/29/2015 Oral Phase Impaired Oral - Pudding Teaspoon -- Oral - Pudding Cup -- Oral - Honey Teaspoon -- Oral - Honey Cup -- Oral - Nectar Teaspoon -- Oral - Nectar Cup -- Oral - Nectar Straw -- Oral - Thin Teaspoon Weak lingual manipulation;Reduced posterior propulsion;Lingual/palatal residue;Decreased bolus cohesion;Delayed oral transit;Premature spillage Oral - Thin Cup NT Oral - Thin Straw NT Oral - Puree Weak lingual manipulation;Lingual/palatal residue;Reduced posterior  propulsion;Premature spillage;Decreased bolus cohesion;Delayed oral transit Oral - Mech Soft -- Oral - Regular -- Oral - Multi-Consistency -- Oral - Pill -- Oral Phase - Comment --  CHL IP PHARYNGEAL PHASE 12/29/2015 Pharyngeal Phase Impaired Pharyngeal- Pudding Teaspoon -- Pharyngeal -- Pharyngeal- Pudding Cup -- Pharyngeal -- Pharyngeal- Honey Teaspoon -- Pharyngeal -- Pharyngeal- Honey Cup -- Pharyngeal -- Pharyngeal- Nectar Teaspoon Reduced pharyngeal peristalsis;Reduced epiglottic inversion;Reduced anterior laryngeal mobility;Reduced laryngeal elevation;Reduced airway/laryngeal closure;Reduced tongue base retraction;Penetration/Aspiration during swallow;Pharyngeal residue - valleculae;Lateral channel residue;Pharyngeal residue - cp segment Pharyngeal Material enters airway, remains ABOVE vocal cords and not ejected out Pharyngeal- Nectar Cup Delayed swallow initiation-pyriform sinuses;Reduced pharyngeal peristalsis;Reduced epiglottic inversion;Reduced anterior laryngeal mobility;Reduced laryngeal elevation;Reduced airway/laryngeal closure;Reduced tongue base retraction;Penetration/Aspiration during swallow;Penetration/Apiration after swallow;Significant aspiration (Amount);Pharyngeal residue - valleculae;Pharyngeal residue - pyriform;Lateral channel residue;Pharyngeal residue - cp segment Pharyngeal Material enters airway, passes BELOW cords without attempt by patient to eject out (silent aspiration);Material enters airway, passes BELOW cords and not ejected out despite cough attempt by patient Pharyngeal- Nectar Straw -- Pharyngeal -- Pharyngeal- Thin Teaspoon Delayed swallow initiation-vallecula;Reduced pharyngeal peristalsis;Reduced epiglottic inversion;Reduced anterior laryngeal mobility;Reduced laryngeal elevation;Reduced airway/laryngeal closure;Reduced tongue base retraction;Penetration/Aspiration during swallow;Penetration/Aspiration before swallow;Pharyngeal residue - valleculae;Lateral channel  residue;Trace aspiration;Pharyngeal residue - cp segment Pharyngeal Material enters airway, passes BELOW cords without attempt by patient to eject out (silent aspiration) Pharyngeal- Thin Cup NT Pharyngeal -- Pharyngeal- Thin Straw NT Pharyngeal -- Pharyngeal- Puree Delayed swallow initiation-vallecula;Reduced epiglottic inversion;Reduced pharyngeal peristalsis;Reduced anterior laryngeal mobility;Reduced laryngeal elevation;Reduced airway/laryngeal closure;Reduced tongue base retraction;Pharyngeal residue - valleculae;Pharyngeal residue - pyriform Pharyngeal -- Pharyngeal- Mechanical Soft -- Pharyngeal -- Pharyngeal- Regular -- Pharyngeal -- Pharyngeal- Multi-consistency -- Pharyngeal -- Pharyngeal- Pill -- Pharyngeal -- Pharyngeal Comment pt continues with gross residuals and poor awareness, WEAK dry swallow/throat clear and cough were ineffective to fully clear aspiration/penetration/residuals, pt did have viscous colored secretions *green tinged* coughed and expectorated from trach prior to MBS and around the trach AFTER   CHL IP CERVICAL ESOPHAGEAL PHASE 12/29/2015 Cervical Esophageal Phase -- Pudding Teaspoon -- Pudding Cup -- Honey Teaspoon -- Honey Cup -- Nectar Teaspoon Prominent cricopharyngeal segment;Reduced cricopharyngeal relaxation Nectar Cup Prominent cricopharyngeal segment;Reduced cricopharyngeal relaxation Nectar Straw -- Thin Teaspoon Reduced cricopharyngeal relaxation;Prominent cricopharyngeal segment Thin Cup NT Thin Straw NT Puree Reduced cricopharyngeal relaxation;Prominent cricopharyngeal segment Mechanical Soft -- Regular -- Multi-consistency -- Pill -- Cervical Esophageal Comment -- No flowsheet data found. Donavan Burnetamara Kimball, MS [facility] S[facility] [phone]              Dg Swallowing Func-speech Pathology  Result Date: 12/17/2015 Objective Swallowing Evaluation: Type of Study: MBS-Modified Barium Swallow Study Patient Details Name: Ardyth GalRalph T Dansby MRN: [id number] Date of Birth: 10-23-1940 Today's Date:  12/17/2015 Time: SLP Start Time (ACUTE ONLY): 0925-SLP  Stop Time (ACUTE ONLY): 0945 SLP Time Calculation (min) (ACUTE ONLY): 20 min Past Medical History: Past Medical History: Diagnosis Date . Bilateral dry eyes  . Coronary artery disease 1997  bare mental stent right coronart artery . Degenerative disc disease   LOWER BACK . Dysphonia  . ED (erectile dysfunction)  . Gastroesophageal reflux disease  . Hyperlipidemia  . Hypertension  . Peptic ulcer  . Pneumonia   HISTORY OF IN CHILDHOOD . Posterior vitreous detachment 10/30/2012 . Urinary hesitancy  . Wears glasses  Past Surgical History: Past Surgical History: Procedure Laterality Date . CARDIAC CATHETERIZATION   . CHOLECYSTECTOMY   . ESOPHAGOGASTRODUODENOSCOPY N/A 12/01/2015  Procedure: ESOPHAGOGASTRODUODENOSCOPY (EGD) BALLOON DILATION OF DUODENAL STRICTURE;  Surgeon: Karie Soda, MD;  Location: [facility];  Service: General;  Laterality: N/A; . ESOPHAGOGASTRODUODENOSCOPY N/A 12/14/2015  Procedure: ESOPHAGOGASTRODUODENOSCOPY (EGD);  Surgeon: Vida Rigger, MD;  Location: [facility];  Service: Endoscopy;  Laterality: N/A;  Patient has tracheostomy . ESOPHAGOGASTRODUODENOSCOPY (EGD) WITH PROPOFOL N/A 07/01/2015  Procedure: ESOPHAGOGASTRODUODENOSCOPY (EGD) WITH PROPOFOL;  Surgeon: Willis Modena, MD;  Location: [facility];  Service: Endoscopy;  Laterality: N/A; . ESOPHAGOGASTRODUODENOSCOPY (EGD) WITH PROPOFOL N/A 11/23/2015  Procedure: ESOPHAGOGASTRODUODENOSCOPY (EGD) WITH PROPOFOL;  Surgeon: Willis Modena, MD;  Location: [facility]COPY;  Service: Endoscopy;  Laterality: N/A;  may need to intubate . EYE SURGERY    growth on right eye removed  . IR GENERIC HISTORICAL  12/13/2015  IR REPLC DUODEN/JEJUNO TUBE PERCUT W/FLUORO 12/13/2015 [name], MD [facility] . LAPAROSCOPIC GASTROSTOMY N/A 12/01/2015  Procedure: LAPAROSCOPIC PLACEMENT OF FEEDING JEJUNOSTOMY AND GASTROSTOMY TUBE;  Surgeon: Karie Soda, MD;  Location: [facility];  Service: General;  Laterality: N/A; .  LAPAROSCOPY N/A 12/03/2015  Procedure: LAPAROSCOPY DIAGNOSTIC, OMENTOPEXY, JEJUNOSTOMY, WASH OUT;  Surgeon: Karie Soda, MD;  Location: [facility];  Service: General;  Laterality: N/A; . STENT TO HEART    2 1997 AND 1 IN 1996 . TONSILLECTOMY   . XI ROBOTIC VAGOTOMY AND ANTRECTOMY N/A 09/25/2015  Procedure: XI ROBOTIC ANTERIOR AND POSTERIOR VAGOTOMY, BILROTH I  ANASTOMOSIS DOR FUNDIPLICATION, OMENTOPEXY  UPPER ENDOSCOPY;  Surgeon: Karie Soda, MD;  Location: [facility];  Service: General;  Laterality: N/A; HPI: 75 yo male former smoker with progressive epigastric pain, nausea, vomiting, bloating from gastric outlet obstruction with pyloric channel ulcer.  Had Billroth 1, vagotomy, fundoplication, omentopexy August 2017 with initial improvement.  Had recurrent symptoms September 2017 that become progressively worse with gastric dilation from recurrent stricture.  Had laparoscopic placement of jejunostomy tube, gastrostomy tube, and EGD balloon dilation of duodenal stricture 10/17.  Developed gastric leakage with peritonitis after pulling out PEG tube 10/19 and taken to OR. On 10/27 found 2 new LUQ abscesses/fluid collections which were drained percutaneously by IR. Had perc trach 10/27.  As of 10/30 his mental status has improved, he has remained off of vasopressors for several days and he has done well with weaning off the ventilator per MD note.  Subjective: pt awake in bed Assessment / Plan / Recommendation CHL IP CLINICAL IMPRESSIONS 12/17/2015 Therapy Diagnosis Moderate oral phase dysphagia;Moderate pharyngeal phase dysphagia;Moderate cervical esophageal phase dysphagia Clinical Impression Patient presents with moderate oropharyngeal dysphagia characterized by significant weakness resulting in residuals and aspiration.  Pt was only given minimal amount of thin and nectar barium during testing.  Of note, after patient was transferred to flouro chair, he demonstrated copious coughing.  He cleared scant secretions through  trach tube at that time.  Transferring to chair and coughing episode significantly fatigued patient.  PMSV  was placed for MBS with good tolerance during testing.  Unfortunately patient is grossly weak resulting in oropharyngeal deficits and secretion retention that mixed with barium.  Delayed oral transiting, lingual pumping and premature spillage of barium into pharynx noted due to weakness.  Compromised tongue base retraction and laryngeal elevation allowed laryngeal penetration and SILENT aspiration both during and after the swallow.  Cues to cough/throat clear and dry swallow did not fully clear penetrates and patient quickly re-penetrated pharyngeal residuals.  In addition patient appeared with decreased proximal esophageal opening/decreased CP opening resulting in residuals in pyriform sinus.  Unfortunately patient is at high risk of aspiration with intake at this time.  Using teach back/live video educated patient to findings and reinforced effective compensation strategies.   Recommend continue NPO except single ice chips with strict precautions - oral care, pmsv in place and cueing patient to cough/clear throat and re-swallow.  Pt will need repeat MBS in future due to silent nature of dysphagia. Will follow up for dysphagia treatment/management.   Thanks for allowing me to help care for this patient.  Impact on safety and function Severe aspiration risk;Risk for inadequate nutrition/hydration   CHL IP TREATMENT RECOMMENDATION 12/17/2015 Treatment Recommendations F/U MBS in --- days (Comment)   Prognosis 12/17/2015 Prognosis for Safe Diet Advancement Fair Barriers to Reach Goals Severity of deficits;Cognitive deficits Barriers/Prognosis Comment -- CHL IP DIET RECOMMENDATION 12/17/2015 SLP Diet Recommendations NPO;Ice chips PRN after oral care Liquid Administration via -- Medication Administration Via alternative means Compensations Minimize environmental distractions;Slow rate;Multiple dry swallows after each  bite/sip;Hard cough after swallow Postural Changes --   CHL IP OTHER RECOMMENDATIONS 12/17/2015 Recommended Consults -- Oral Care Recommendations Oral care QID Other Recommendations --   CHL IP FOLLOW UP RECOMMENDATIONS 12/17/2015 Follow up Recommendations Skilled Nursing facility;Inpatient Rehab   CHL IP FREQUENCY AND DURATION 12/17/2015 Speech Therapy Frequency (ACUTE ONLY) min 2x/week Treatment Duration 2 weeks      CHL IP ORAL PHASE 12/17/2015 Oral Phase Impaired Oral - Pudding Teaspoon -- Oral - Pudding Cup -- Oral - Honey Teaspoon -- Oral - Honey Cup -- Oral - Nectar Teaspoon -- Oral - Nectar Cup Weak lingual manipulation;Reduced posterior propulsion;Delayed oral transit;Decreased bolus cohesion;Lingual pumping Oral - Nectar Straw -- Oral - Thin Teaspoon Weak lingual manipulation;Delayed oral transit;Decreased bolus cohesion;Reduced posterior propulsion;Lingual pumping Oral - Thin Cup Weak lingual manipulation;Reduced posterior propulsion;Delayed oral transit;Decreased bolus cohesion;Lingual pumping Oral - Thin Straw Weak lingual manipulation;Decreased bolus cohesion;Delayed oral transit;Reduced posterior propulsion;Lingual pumping Oral - Puree -- Oral - Mech Soft -- Oral - Regular -- Oral - Multi-Consistency -- Oral - Pill -- Oral Phase - Comment --  CHL IP PHARYNGEAL PHASE 12/17/2015 Pharyngeal Phase Impaired Pharyngeal- Pudding Teaspoon -- Pharyngeal -- Pharyngeal- Pudding Cup -- Pharyngeal -- Pharyngeal- Honey Teaspoon -- Pharyngeal -- Pharyngeal- Honey Cup -- Pharyngeal -- Pharyngeal- Nectar Teaspoon Reduced laryngeal elevation;Pharyngeal residue - pyriform;Pharyngeal residue - valleculae;Reduced tongue base retraction;Reduced airway/laryngeal closure;Reduced epiglottic inversion;Penetration/Aspiration during swallow Pharyngeal Material enters airway, passes BELOW cords without attempt by patient to eject out (silent aspiration) Pharyngeal- Nectar Cup Reduced laryngeal elevation;Reduced tongue base  retraction;Reduced anterior laryngeal mobility;Reduced epiglottic inversion;Reduced airway/laryngeal closure;Penetration/Aspiration during swallow Pharyngeal Material enters airway, passes BELOW cords without attempt by patient to eject out (silent aspiration) Pharyngeal- Nectar Straw -- Pharyngeal -- Pharyngeal- Thin Teaspoon Reduced laryngeal elevation;Reduced airway/laryngeal closure;Reduced tongue base retraction;Penetration/Aspiration during swallow;Reduced epiglottic inversion;Trace aspiration;Pharyngeal residue - valleculae;Pharyngeal residue - pyriform;Pharyngeal residue - cp segment Pharyngeal Material enters airway, passes BELOW cords without attempt by patient  to eject out (silent aspiration) Pharyngeal- Thin Cup Moderate aspiration;Penetration/Aspiration during swallow;Penetration/Apiration after swallow;Reduced pharyngeal peristalsis;Reduced epiglottic inversion;Reduced laryngeal elevation;Reduced airway/laryngeal closure;Reduced tongue base retraction Pharyngeal Material enters airway, passes BELOW cords without attempt by patient to eject out (silent aspiration) Pharyngeal- Thin Straw Reduced pharyngeal peristalsis;Reduced epiglottic inversion;Reduced anterior laryngeal mobility;Reduced laryngeal elevation;Reduced airway/laryngeal closure;Pharyngeal residue - valleculae;Pharyngeal residue - pyriform;Moderate aspiration;Penetration/Aspiration during swallow Pharyngeal Material enters airway, passes BELOW cords without attempt by patient to eject out (silent aspiration) Pharyngeal- Puree -- Pharyngeal -- Pharyngeal- Mechanical Soft -- Pharyngeal -- Pharyngeal- Regular -- Pharyngeal -- Pharyngeal- Multi-consistency -- Pharyngeal -- Pharyngeal- Pill -- Pharyngeal -- Pharyngeal Comment pt without awareness to residuals, penetration and aspiration did not fully clear despite cued cough, pt also with aspiration of secretions mixed with barium  CHL IP CERVICAL ESOPHAGEAL PHASE 12/17/2015 Cervical Esophageal  Phase Impaired Pudding Teaspoon -- Pudding Cup -- Honey Teaspoon -- Honey Cup -- Nectar Teaspoon Reduced cricopharyngeal relaxation;Prominent cricopharyngeal segment Nectar Cup Reduced cricopharyngeal relaxation;Prominent cricopharyngeal segment Nectar Straw -- Thin Teaspoon Reduced cricopharyngeal relaxation;Prominent cricopharyngeal segment Thin Cup Reduced cricopharyngeal relaxation;Prominent cricopharyngeal segment Thin Straw Reduced cricopharyngeal relaxation;Prominent cricopharyngeal segment Puree -- Mechanical Soft -- Regular -- Multi-consistency -- Pill -- Cervical Esophageal Comment pt appeared with tight UES  Donavan Burnet, MS [facility] SLP [phone]               Assessment and Plan  TPN/ G TUBE FEEDING/ METABOLIC ACIDOSIS - the first 24 hours of Na acetate is in and pt has had 2 cans of osmolyte; today Na+ 139, K+ 3.8, Cl 107, CO2  20, BUN 22.5/Cr 0.83; even though decreased free water ordered pt is still on 1000 cc free water through G tube daily; pt is stable    Labs/tests ordered: BMp  Time spent > 25 min;> 50% of time with patient was spent reviewing records, labs, tests and studies, counseling and developing plan of care  Thurston Hole D. [name], MD

## 2016-01-15 ENCOUNTER — Encounter: Payer: Self-pay | Admitting: Internal Medicine

## 2016-01-15 ENCOUNTER — Non-Acute Institutional Stay (SKILLED_NURSING_FACILITY): Payer: Medicare Other | Admitting: Internal Medicine

## 2016-01-15 DIAGNOSIS — R1310 Dysphagia, unspecified: Secondary | ICD-10-CM

## 2016-01-15 DIAGNOSIS — E43 Unspecified severe protein-calorie malnutrition: Secondary | ICD-10-CM | POA: Diagnosis not present

## 2016-01-15 DIAGNOSIS — E872 Acidosis, unspecified: Secondary | ICD-10-CM

## 2016-01-15 DIAGNOSIS — R531 Weakness: Secondary | ICD-10-CM

## 2016-01-15 DIAGNOSIS — Z789 Other specified health status: Secondary | ICD-10-CM

## 2016-01-15 DIAGNOSIS — Z931 Gastrostomy status: Secondary | ICD-10-CM

## 2016-01-15 NOTE — Progress Notes (Signed)
[facility]  [facility] Room Number: [phone] Place of Service:  SNF [phone](31)  Randon Goldsmithnne D. Lyn HollingsheadAlexander, MD  Patient Care Team: Kirby FunkJohn Griffin, MD as PCP - General (Internal Medicine) Karie SodaSteven Gross, MD as Consulting Physician (General Surgery) Willis ModenaWilliam Outlaw, MD as Consulting Physician (Gastroenterology) Coralyn HellingVineet Sood, MD as Consulting Physician (Pulmonary Disease)  Extended Emergency Contact Information Primary Emergency Contact: Antony ContrasEdwards,Emma Jean Address: [address]          [place], [place] [address] [place] Home Phone: [phone] Mobile Phone: [phone] Relation: Spouse Secondary Emergency Contact: Alan RipperBlackman,[name]  [place] Mobile Phone: [phone] Relation: Daughter    Allergies: Flomax [tamsulosin hcl]; Lisinopril; Lorazepam; and Uroxatral [alfuzosin hcl er]  Chief Complaint  Patient presents with  . Acute Visit    Acute    HPI: Patient is 75 y.o. male who is being seen in f/u for multiple issues including TPN, Gtube feedings and electrolyte abnormalities.Also pt is being seen by ST to evaluate his swallowing to lok specifically at aspiration. Pt c/o he feels very full after he gets his osmolyte. It is reported that his stools are formed-this is HUGE.  Past Medical History:  Diagnosis Date  . Bilateral dry eyes   . BPH (benign prostatic hyperplasia)   . Central pterygium of right eye   . Coronary artery disease 1997, 2009   bare mental stent right coronart artery, occlusive followed by Dr. Garnette ScheuermannHank Smith in [place]  . Degenerative disc disease    LOWER BACK  . Dysphonia   . ED (erectile dysfunction)   . Gastroesophageal reflux disease   . Hyperlipidemia   . Hyperopia of both eyes with astigmatism and presbyopia   . Hypertension   . Nuclear senile cataract    Dr. Bernadette HoitHayden [facility] in [place]  . Peptic ulcer   . Pneumonia    HISTORY OF IN CHILDHOOD  . Posterior vitreous detachment 10/30/2012  .  Salzmann's nodular dystrophy of left eye   . Urinary hesitancy   . Wears glasses     Past Surgical History:  Procedure Laterality Date  . CARDIAC CATHETERIZATION    . CHOLECYSTECTOMY    . ESOPHAGOGASTRODUODENOSCOPY N/A 12/01/2015   Procedure: ESOPHAGOGASTRODUODENOSCOPY (EGD) BALLOON DILATION OF DUODENAL STRICTURE;  Surgeon: Karie SodaSteven Gross, MD;  Location: [facility];  Service: General;  Laterality: N/A;  . ESOPHAGOGASTRODUODENOSCOPY N/A 12/14/2015   Procedure: ESOPHAGOGASTRODUODENOSCOPY (EGD);  Surgeon: Vida RiggerMarc Magod, MD;  [facility];  Service: Endoscopy;  Laterality: N/A;  Patient has tracheostomy  . ESOPHAGOGASTRODUODENOSCOPY (EGD) WITH PROPOFOL N/A 07/01/2015   Procedure: ESOPHAGOGASTRODUODENOSCOPY (EGD) WITH PROPOFOL;  Surgeon: Willis ModenaWilliam Outlaw, MD;  [facility]: [facility];  Service: Endoscopy;  Laterality: N/A;  . ESOPHAGOGASTRODUODENOSCOPY (EGD) WITH PROPOFOL N/A 11/23/2015   Procedure: ESOPHAGOGASTRODUODENOSCOPY (EGD) WITH PROPOFOL;  Surgeon: Willis ModenaWilliam Outlaw, MD;  [facility];  Service: Endoscopy;  Laterality: N/A;  may need to intubate  . EYE SURGERY     growth on right eye removed   . IR GENERIC HISTORICAL  12/13/2015   IR REPLC DUODEN/JEJUNO TUBE PERCUT W/FLUORO 12/13/2015 Jolaine ClickArthur Hoss, MD [facility]-INTER[facility] RAD  . LAPAROSCOPIC GASTROSTOMY N/A 12/01/2015   Procedure: LAPAROSCOPIC PLACEMENT OF FEEDING JEJUNOSTOMY AND GASTROSTOMY TUBE;  Surgeon: Karie SodaSteven Gross, MD;  Location: [facility];  Service: General;  Laterality: N/A;  . LAPAROSCOPY N/A 12/03/2015   Procedure: LAPAROSCOPY DIAGNOSTIC, OMENTOPEXY, JEJUNOSTOMY, WASH OUT;  Surgeon: Karie SodaSteven Gross, MD;  Location: [facility];  Service: General;  Laterality: N/A;  . STENT TO HEART  2 1997 AND 1 IN 1996  . TONSILLECTOMY    . XI ROBOTIC VAGOTOMY AND ANTRECTOMY N/A 09/25/2015   Procedure: XI ROBOTIC ANTERIOR AND POSTERIOR VAGOTOMY, BILROTH I  ANASTOMOSIS DOR FUNDIPLICATION, OMENTOPEXY  UPPER ENDOSCOPY;  Surgeon: Karie Soda, MD;  Location: [facility];  Service: General;  Laterality: N/A;      Medication List       Accurate as of 01/15/16  1:24 PM. Always use your most recent med list.          aluminum-magnesium hydroxide-simethicone 200-200-20 MG/5ML Susp Commonly known as:  MAALOX Take 30 mLs by mouth every 6 (six) hours as needed.   aspirin 81 MG chewable tablet Place 81 mg into feeding tube every evening.   atorvastatin 40 MG tablet Commonly known as:  LIPITOR Place 40 mg into feeding tube at bedtime.   bisacodyl 10 MG suppository Commonly known as:  DULCOLAX Place 10 mg rectally daily as needed for moderate constipation or severe constipation.   bismuth subsalicylate 262 MG/15ML suspension Commonly known as:  PEPTO BISMOL Place 30 mLs into feeding tube every 8 (eight) hours as needed for indigestion or diarrhea or loose stools.   calcium-vitamin D 500-200 MG-UNIT tablet Commonly known as:  OSCAL WITH D Place 1 tablet into feeding tube daily with breakfast.   CLINIMIX E/DEXTROSE (2.75/10) 2.75 % Soln Inject 2,160 mLs into the vein as needed.   feeding supplement (OSMOLITE 1.2 CAL) Liqd Place 237 mLs into feeding tube. Give 1 can via G-tube every day at  10 am and  6 pm.   Ferrous Sulfate 220 (44 Fe) MG/5ML Liqd Place 7.5 mLs into feeding tube 3 (three) times daily.   FLUoxetine 20 MG/5ML solution Commonly known as:  PROZAC Place 20 mg into feeding tube daily.   haloperidol 0.5 MG tablet Commonly known as:  HALDOL Take 0.5 mg by mouth 2 (two) times daily. At 6 am and 4:30 pm.   haloperidol 0.5 MG tablet Commonly known as:  HALDOL Place 0.5 mg into feeding tube as needed for agitation. STOP DATE 01/24/2016   loperamide 1 MG/5ML solution Commonly known as:  IMODIUM 10 mg. Take 10 mls via tube every 8 hours as needed for more than 2 bms every 8 hours.   magic mouthwash Soln Take 15 mLs by mouth 4 (four) times daily as needed for mouth pain (sore throat).   metoCLOPramide 5 MG/5ML  solution Commonly known as:  REGLAN Place 10 mg into feeding tube every 6 (six) hours as needed for nausea or vomiting.   metoprolol tartrate 25 MG tablet Commonly known as:  LOPRESSOR Place 25 mg into feeding tube daily.   nicotine 7 mg/24hr patch Commonly known as:  NICODERM CQ - dosed in mg/24 hr Place 7 mg onto the skin daily. Rotate site   nitroGLYCERIN 0.4 MG SL tablet Commonly known as:  NITROSTAT Place 0.4 mg under the tongue every 5 (five) minutes as needed for chest pain.   omeprazole 40 MG capsule Commonly known as:  PRILOSEC Take 40 mg by mouth 2 (two) times daily.   REFRESH OP Place 2 drops into both eyes as needed (For dry eyes.).   RESTASIS 0.05 % ophthalmic emulsion Generic drug:  cycloSPORINE Place 1 drop into both eyes 2 (two) times daily.   sterile water for irrigation Irrigate with 250 mLs as directed every 6 (six) hours.   traZODone 50 MG tablet Commonly known as:  DESYREL Place 50 mg into feeding tube at bedtime.  Vitamin B-12 1000 MCG Subl Place 1 tablet under the tongue daily with breakfast.       Meds ordered this encounter  Medications  . loperamide (IMODIUM) 1 MG/5ML solution    Sig: 10 mg. Take 10 mls via tube every 8 hours as needed for more than 2 bms every 8 hours.  . bisacodyl (DULCOLAX) 10 MG suppository    Sig: Place 10 mg rectally daily as needed for moderate constipation or severe constipation.  . Amino Ac Elect-Calc in D10W (CLINIMIX E/DEXTROSE, 2.75/10,) 2.75 % SOLN    Sig: Inject 2,160 mLs into the vein as needed.  . haloperidol (HALDOL) 0.5 MG tablet    Sig: Place 0.5 mg into feeding tube as needed for agitation. STOP DATE 01/24/2016  . FLUoxetine (PROZAC) 20 MG/5ML solution    Sig: Place 20 mg into feeding tube daily.  . metoprolol tartrate (LOPRESSOR) 25 MG tablet    Sig: Place 25 mg into feeding tube daily.    Immunization History  Administered Date(s) Administered  . PPD Test 01/13/2016    Social History   Substance Use Topics  . Smoking status: Former Smoker    Packs/day: 2.00    Years: 20.00    Types: Cigarettes    Quit date: 02/15/1995  . Smokeless tobacco: Never Used  . Alcohol use No    Review of Systems  DATA OBTAINED: from patient, wife GENERAL:  no fevers, fatigue, appetite changes; feels full after osmolyte SKIN: No itching, rash HEENT: No complaint RESPIRATORY: No cough, wheezing, SOB CARDIAC: No chest pain, palpitations, lower extremity edema  GI: No abdominal pain, No N/V/D or constipation, No heartburn or reflux  GU: No dysuria, frequency or urgency, or incontinence  MUSCULOSKELETAL: No unrelieved bone/joint pain NEUROLOGIC: No headache, dizziness  PSYCHIATRIC: No overt anxiety or sadness  Vitals:   01/15/16 1323  BP: (!) 112/58  Pulse: 81  Resp: 18  Temp: 97.6 F (36.4 C)   Body mass index is 21.31 kg/m. Physical Exam  GENERAL APPEARANCE: Alert, conversant, No acute distress  SKIN: No diaphoresis rash HEENT: Unremarkable RESPIRATORY: Breathing is even, unlabored. Lung sounds are clear   CARDIOVASCULAR: Heart RRR no murmurs, rubs or gallops. No peripheral edema  GASTROINTESTINAL: Abdomen is soft, non-tender, not distended w/ normal bowel sounds; G tube  GENITOURINARY: Bladder non tender, not distended  MUSCULOSKELETAL: No abnormal joints or musculature NEUROLOGIC: Cranial nerves 2-12 grossly intact. Moves all extremities PSYCHIATRIC: Mood and affect appropriate to situation, no behavioral issues  Patient Active Problem List   Diagnosis Date Noted  . 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 Vitamin B12 deficiency 01/02/2016  . BPH (benign prostatic hyperplasia)   . Posterior vitreous detachment   . Salzmann's nodular dystrophy of left eye   . Hyperopia of both eyes with astigmatism and presbyopia   . Central pterygium of right eye   . Pressure injury of skin 12/30/2015  . Hypernatremia 12/28/2015  . Tracheostomy in place [facility]) 12/25/2015  .  Pneumatosis of intestines 12/11/2015  . Tobacco abuse 12/09/2015  . Ischemic right index finger at tip 12/09/2015  . Gastritis 12/09/2015  . Gastric tube present (LUQ) 12/09/2015  . Jejunostomy tube present (LLQ) 12/09/2015  . Acute respiratory failure with hypoxemia (HCC)   . Protein-calorie malnutrition, severe 12/04/2015  . Encephalopathy acute 12/03/2015  . Bile peritonitis due to gastric tube dislodgement 12/03/2015  . Gastric Ileus  11/25/2015  . Gastric outlet obstruction 11/24/2015  . Pyloric stricture 11/24/2015  . Anxiety state 09/28/2015  .  Hyperlipidemia   . 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 Coronary atherosclerosis of native coronary artery 04/24/2013  . Essential hypertension, benign 04/24/2013  . Other and unspecified hyperlipidemia 04/24/2013  . Central pterygium 10/30/2012  . 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 Cataract, nuclear sclerotic senile 10/30/2012    CMP     Component Value Date/Time   NA 139 01/14/2016   K 3.8 01/14/2016   CL 123 (H) 12/29/2015 0900   CO2 23 12/29/2015 0900   GLUCOSE 117 (H) 12/29/2015 0900   BUN 23 (A) 01/14/2016   CREATININE 0.8 01/14/2016   CREATININE 1.44 (H) 12/29/2015 0900   CALCIUM 7.5 (L) 12/29/2015 0900   PROT 5.8 (L) 12/28/2015 0419   ALBUMIN 1.6 (L) 12/28/2015 0419   AST 42 (H) 12/28/2015 0419   ALT 51 12/28/2015 0419   ALKPHOS 340 (H) 12/28/2015 0419   BILITOT 0.8 12/28/2015 0419   GFRNONAA 46 (L) 12/29/2015 0900   GFRAA 53 (L) 12/29/2015 0900    Recent Labs  12/21/15 0530  12/24/15 0435  12/27/15 0348  12/28/15 0419 12/29/15 12/29/15 0900 01/14/16  NA 147*  < > 152*  < > 155*  < > 154* 153* 153* 139  K 3.5  < > 3.8  < > 3.7  < > 3.7  --  3.8 3.8  CL 115*  < > 121*  < > 125*  --  124*  --  123*  --   CO2 24  < > 23  < > 24  --  24  --  23  --   GLUCOSE 118*  < > 140*  < > 133*  --  132*  --  117*  --   BUN 40*  < >  58*  < > 79*  < > 69* 63* 63* 23*  CREATININE 1.06  < > 1.19  < > 1.33*  < > 1.25* 1.4* 1.44* 0.8  CALCIUM 8.4*  < > 8.0*  < > 7.3*  --  7.3*  --  7.5*  --   MG 2.0  --  2.2  --   --   --  2.1  --   --   --   PHOS 4.3  --  3.4  --   --   --  2.8  --   --   --   < > = values in this interval not displayed.  Recent Labs  12/21/15 0530 12/24/15 0435 12/28/15 0419  AST 31 43* 42*  ALT 28 44 51  ALKPHOS 212* 305* 340*  BILITOT 1.4* 1.1 0.8  PROT 6.1* 6.2* 5.8*  ALBUMIN 1.6* 1.6* 1.6*    Recent Labs  12/14/15 0549 12/15/15 0424 12/18/15 1159 12/21/15 0530 12/29/15 12/29/15 0900 01/14/16  WBC 7.7 10.2 10.6* 8.9 15.3 15.3* 8.0  NEUTROABS 5.1 7.0  --  6.1  --   --   --   HGB 10.3* 8.8* 8.6* 8.4*  --  7.8* 11.2*  HCT 31.0* 26.4* 26.0* 25.9*  --  24.7* 35*  MCV 88.8 89.2 87.5 89.0  --  89.8  --   PLT 516* 633* 573* 473*  --  509* 290    Recent Labs  12/14/15 0549 12/21/15 0530 01/11/16  TRIG 194* 199* 177*   No results found for: MICROALBUR No results found for: TSH No results found for: HGBA1C Lab Results  Component Value Date   TRIG 177 (A) 01/11/2016  Significant Diagnostic Results in last 30 days:  Dg Abd 1 View  Result Date: 12/18/2015 CLINICAL DATA:  Jejunostomy tube replacement. EXAM: ABDOMEN - 1 VIEW COMPARISON:  Procedural spot fluoroscopic 12/13/2015. CT abdomen and pelvis 12/10/2015. FINDINGS: Percutaneous jejunostomy tube overlies the left mid abdomen. Injected contrast opacifies an adjacent small bowel loop. A small amount of contrast is partially visualized in the left upper quadrant in the expected region of the GE junction and may be within the distal esophagus and stomach. Abdominal surgical clips are present. Gas is present in grossly nondilated loops of small and large bowel to the level of the rectum without evidence of obstruction. IMPRESSION: Satisfactory positioning of percutaneous jejunostomy tube based on appearance of injected contrast material.  Electronically Signed   By: Sebastian Ache M.D.   On: 12/18/2015 10:13   Dg Swallowing Func-speech Pathology  Result Date: 12/29/2015 Objective Swallowing Evaluation: Type of Study: MBS-Modified Barium Swallow Study Patient Details Name: GEDALIA MCMILLON MRN: [id number] Date of Birth: September 18, 1940 Today's Date: 12/29/2015 Time: SLP Start Time (ACUTE ONLY): 0905-SLP Stop Time (ACUTE ONLY): 0940 SLP Time Calculation (min) (ACUTE ONLY): 35 min Past Medical History: Past Medical History: Diagnosis Date . Bilateral dry eyes  . Coronary artery disease 1997  bare mental stent right coronart artery . Degenerative disc disease   LOWER BACK . Dysphonia  . ED (erectile dysfunction)  . Gastroesophageal reflux disease  . Hyperlipidemia  . Hypertension  . Peptic ulcer  . Pneumonia   HISTORY OF IN CHILDHOOD . Posterior vitreous detachment 10/30/2012 . Urinary hesitancy  . Wears glasses  Past Surgical History: Past Surgical History: Procedure Laterality Date . CARDIAC CATHETERIZATION   . CHOLECYSTECTOMY   . ESOPHAGOGASTRODUODENOSCOPY N/A 12/01/2015  Procedure: ESOPHAGOGASTRODUODENOSCOPY (EGD) BALLOON DILATION OF DUODENAL STRICTURE;  Surgeon: Karie Soda, MD;  Location: [facility];  Service: General;  Laterality: N/A; . ESOPHAGOGASTRODUODENOSCOPY N/A 12/14/2015  Procedure: ESOPHAGOGASTRODUODENOSCOPY (EGD);  Surgeon: Vida Rigger, MD;  [facility];  Service: Endoscopy;  Laterality: N/A;  Patient has tracheostomy . ESOPHAGOGASTRODUODENOSCOPY (EGD) WITH PROPOFOL N/A 07/01/2015  Procedure: ESOPHAGOGASTRODUODENOSCOPY (EGD) WITH PROPOFOL;  Surgeon: Willis Modena, MD;  [facility];  Service: Endoscopy;  Laterality: N/A; . ESOPHAGOGASTRODUODENOSCOPY (EGD) WITH PROPOFOL N/A 11/23/2015  Procedure: ESOPHAGOGASTRODUODENOSCOPY (EGD) WITH PROPOFOL;  Surgeon: Willis Modena, MD;  Location: [facility] ENDOSCOPY;  Service: Endoscopy;  Laterality: N/A;  may need to intubate . EYE SURGERY    growth on right eye removed  . IR GENERIC HISTORICAL   12/13/2015  IR REPLC DUODEN/JEJUNO TUBE PERCUT W/FLUORO 12/13/2015 [name], MD [facility]INTER[facility] RAD . LAPAROSCOPIC GASTROSTOMY N/A 12/01/2015  Procedure: LAPAROSCOPIC PLACEMENT OF FEEDING JEJUNOSTOMY AND GASTROSTOMY TUBE;  Surgeon: Karie Soda, MD;  [facility];  Service: General;  Laterality: N/A; . LAPAROSCOPY N/A 12/03/2015  Procedure: LAPAROSCOPY DIAGNOSTIC, OMENTOPEXY, JEJUNOSTOMY, WASH OUT;  Surgeon: Karie Soda, MD;  [facility];  Service: General;  Laterality: N/A; . STENT TO HEART    2 1997 AND 1 IN 1996 . TONSILLECTOMY   . XI ROBOTIC VAGOTOMY AND ANTRECTOMY N/A 09/25/2015  Procedure: XI ROBOTIC ANTERIOR AND POSTERIOR VAGOTOMY, BILROTH I  ANASTOMOSIS DOR FUNDIPLICATION, OMENTOPEXY  UPPER ENDOSCOPY;  Surgeon: Karie Soda, MD;  Location: [facility];  Service: General;  Laterality: N/A; HPI: 75 yo male former smoker with progressive epigastric pain, nausea, vomiting, bloating from gastric outlet obstruction with pyloric channel ulcer.  Had Billroth 1, vagotomy, fundoplication, omentopexy August 2017 with initial improvement.  Had recurrent symptoms September 2017 that become progressively worse with gastric dilation  from recurrent stricture.  Had laparoscopic placement of jejunostomy tube, gastrostomy tube, and EGD balloon dilation of duodenal stricture 10/17.  Developed gastric leakage with peritonitis after pulling out PEG tube 10/19 and taken to OR. On 10/27 found 2 new LUQ abscesses/fluid collections which were drained percutaneously by IR. Had perc trach 10/27.  As of 10/30 his mental status has improved.  Pt underwent MBS and has been on TPN since due to level of dysphagia.  Ice chips have been consumed with improved tolerance.  MD ordered repeat MBS.   Subjective: pt awake in bed, weak Assessment / Plan / Recommendation CHL IP CLINICAL IMPRESSIONS 12/29/2015 Therapy Diagnosis Moderate oral phase dysphagia;Moderate pharyngeal phase dysphagia;Moderate cervical esophageal phase dysphagia Clinical  Impression Patient continues with moderate oropharyngeal dysphagia that appears marginally worse than prior MBS 12/17/15 results.  Swallow function continues to be significantly weak resulting in residuals and aspiration with poor cough mechanism.   Pt again fatigued during MBS and at times had difficulty keeping his eyes open.   Viscous green tinged secretions coughed through trach (blew off PMSV) after oral care and before barium administration.  Pt also with copious secretions coughed AROUND trach following MBS *pt had PMSV in place.   Gross weakness secretion retention that mixed with barium without consistent awareness.  Delayed oral transiting, lingual pumping and premature spillage of barium into pharynx noted due to weakness.  Compromised tongue base retraction and laryngeal elevation allowed laryngeal penetration and SILENT aspiration both during and after the swallow.  Cues to cough/throat clear and dry swallow did not fully clear penetrates and patient quickly re-penetrated pharyngeal residuals.  In addition patient appeared with decreased proximal esophageal opening/decreased CP opening resulting in residuals in pyriform sinus.  Unfortunately patient is at high risk of aspiration with intake at this time.  Using teach back/live video educated patient and daughter to findings and reinforced effective compensation strategies.   Recommend continue NPO except single ice chips with strict precautions - oral care, pmsv in place and cueing patient to cough/clear throat and re-swallow.  Pt also with increase in delayed responses to SLP instructions/cues today- and states "I'm weak".  Given pt's lack of swallow improvement, SlP is concerned for swallow prognosis during acute stay.  Pt will need repeat MBS in future due to silent nature of dysphagia. Will follow up for dysphagia treatment/management.   Thanks for allowing me to help care for this patient.  Impact on safety and function Severe aspiration risk;Risk  for inadequate nutrition/hydration   CHL IP TREATMENT RECOMMENDATION 12/29/2015 Treatment Recommendations F/U MBS in --- days (Comment)   Prognosis 12/29/2015 Prognosis for Safe Diet Advancement Guarded Barriers to Reach Goals Severity of deficits;Cognitive deficits Barriers/Prognosis Comment -- CHL IP DIET RECOMMENDATION 12/29/2015 SLP Diet Recommendations NPO;Ice chips PRN after oral care Liquid Administration via -- Medication Administration Via alternative means Compensations Minimize environmental distractions;Slow rate;Multiple dry swallows after each bite/sip;Hard cough after swallow Postural Changes --   CHL IP OTHER RECOMMENDATIONS 12/29/2015 Recommended Consults -- Oral Care Recommendations Oral care QID Other Recommendations --   CHL IP FOLLOW UP RECOMMENDATIONS 12/29/2015 Follow up Recommendations Skilled Nursing facility;Inpatient Rehab   CHL IP FREQUENCY AND DURATION 12/29/2015 Speech Therapy Frequency (ACUTE ONLY) min 2x/week Treatment Duration 2 weeks      CHL IP ORAL PHASE 12/29/2015 Oral Phase Impaired Oral - Pudding Teaspoon -- Oral - Pudding Cup -- Oral - Honey Teaspoon -- Oral - Honey Cup -- Oral - Nectar Teaspoon -- Oral - Nectar Cup --  Oral - Nectar Straw -- Oral - Thin Teaspoon Weak lingual manipulation;Reduced posterior propulsion;Lingual/palatal residue;Decreased bolus cohesion;Delayed oral transit;Premature spillage Oral - Thin Cup NT Oral - Thin Straw NT Oral - Puree Weak lingual manipulation;Lingual/palatal residue;Reduced posterior propulsion;Premature spillage;Decreased bolus cohesion;Delayed oral transit Oral - Mech Soft -- Oral - Regular -- Oral - Multi-Consistency -- Oral - Pill -- Oral Phase - Comment --  CHL IP PHARYNGEAL PHASE 12/29/2015 Pharyngeal Phase Impaired Pharyngeal- Pudding Teaspoon -- Pharyngeal -- Pharyngeal- Pudding Cup -- Pharyngeal -- Pharyngeal- Honey Teaspoon -- Pharyngeal -- Pharyngeal- Honey Cup -- Pharyngeal -- Pharyngeal- Nectar Teaspoon Reduced pharyngeal  peristalsis;Reduced epiglottic inversion;Reduced anterior laryngeal mobility;Reduced laryngeal elevation;Reduced airway/laryngeal closure;Reduced tongue base retraction;Penetration/Aspiration during swallow;Pharyngeal residue - valleculae;Lateral channel residue;Pharyngeal residue - cp segment Pharyngeal Material enters airway, remains ABOVE vocal cords and not ejected out Pharyngeal- Nectar Cup Delayed swallow initiation-pyriform sinuses;Reduced pharyngeal peristalsis;Reduced epiglottic inversion;Reduced anterior laryngeal mobility;Reduced laryngeal elevation;Reduced airway/laryngeal closure;Reduced tongue base retraction;Penetration/Aspiration during swallow;Penetration/Apiration after swallow;Significant aspiration (Amount);Pharyngeal residue - valleculae;Pharyngeal residue - pyriform;Lateral channel residue;Pharyngeal residue - cp segment Pharyngeal Material enters airway, passes BELOW cords without attempt by patient to eject out (silent aspiration);Material enters airway, passes BELOW cords and not ejected out despite cough attempt by patient Pharyngeal- Nectar Straw -- Pharyngeal -- Pharyngeal- Thin Teaspoon Delayed swallow initiation-vallecula;Reduced pharyngeal peristalsis;Reduced epiglottic inversion;Reduced anterior laryngeal mobility;Reduced laryngeal elevation;Reduced airway/laryngeal closure;Reduced tongue base retraction;Penetration/Aspiration during swallow;Penetration/Aspiration before swallow;Pharyngeal residue - valleculae;Lateral channel residue;Trace aspiration;Pharyngeal residue - cp segment Pharyngeal Material enters airway, passes BELOW cords without attempt by patient to eject out (silent aspiration) Pharyngeal- Thin Cup NT Pharyngeal -- Pharyngeal- Thin Straw NT Pharyngeal -- Pharyngeal- Puree Delayed swallow initiation-vallecula;Reduced epiglottic inversion;Reduced pharyngeal peristalsis;Reduced anterior laryngeal mobility;Reduced laryngeal elevation;Reduced airway/laryngeal  closure;Reduced tongue base retraction;Pharyngeal residue - valleculae;Pharyngeal residue - pyriform Pharyngeal -- Pharyngeal- Mechanical Soft -- Pharyngeal -- Pharyngeal- Regular -- Pharyngeal -- Pharyngeal- Multi-consistency -- Pharyngeal -- Pharyngeal- Pill -- Pharyngeal -- Pharyngeal Comment pt continues with gross residuals and poor awareness, WEAK dry swallow/throat clear and cough were ineffective to fully clear aspiration/penetration/residuals, pt did have viscous colored secretions *green tinged* coughed and expectorated from trach prior to MBS and around the trach AFTER   CHL IP CERVICAL ESOPHAGEAL PHASE 12/29/2015 Cervical Esophageal Phase -- Pudding Teaspoon -- Pudding Cup -- Honey Teaspoon -- Honey Cup -- Nectar Teaspoon Prominent cricopharyngeal segment;Reduced cricopharyngeal relaxation Nectar Cup Prominent cricopharyngeal segment;Reduced cricopharyngeal relaxation Nectar Straw -- Thin Teaspoon Reduced cricopharyngeal relaxation;Prominent cricopharyngeal segment Thin Cup NT Thin Straw NT Puree Reduced cricopharyngeal relaxation;Prominent cricopharyngeal segment Mechanical Soft -- Regular -- Multi-consistency -- Pill -- Cervical Esophageal Comment -- No flowsheet data found. Donavan Burnet, MS [facility] SLP [phone]              Dg Swallowing Func-speech Pathology  Result Date: 12/17/2015 Objective Swallowing Evaluation: Type of Study: MBS-Modified Barium Swallow Study Patient Details Name: NOLLAN MULDROW MRN: [id number] Date of Birth: 28-Jan-1941 Today's Date: 12/17/2015 Time: SLP Start Time (ACUTE ONLY): 0925-SLP Stop Time (ACUTE ONLY): 0945 SLP Time Calculation (min) (ACUTE ONLY): 20 min Past Medical History: Past Medical History: Diagnosis Date . Bilateral dry eyes  . Coronary artery disease 1997  bare mental stent right coronart artery . Degenerative disc disease   LOWER BACK . Dysphonia  . ED (erectile dysfunction)  . Gastroesophageal reflux disease  . Hyperlipidemia  . Hypertension  . Peptic ulcer   . Pneumonia   HISTORY OF IN CHILDHOOD . Posterior vitreous detachment 10/30/2012 . Urinary hesitancy  . Wears glasses  Past Surgical History:  Past Surgical History: Procedure Laterality Date . CARDIAC CATHETERIZATION   . CHOLECYSTECTOMY   . ESOPHAGOGASTRODUODENOSCOPY N/A 12/01/2015  Procedure: ESOPHAGOGASTRODUODENOSCOPY (EGD) BALLOON DILATION OF DUODENAL STRICTURE;  Surgeon: Karie Soda, MD;  Location: [facility];  Service: General;  Laterality: N/A; . ESOPHAGOGASTRODUODENOSCOPY N/A 12/14/2015  Procedure: ESOPHAGOGASTRODUODENOSCOPY (EGD);  Surgeon: Vida Rigger, MD;  Location: [facility];  Service: Endoscopy;  Laterality: N/A;  Patient has tracheostomy . ESOPHAGOGASTRODUODENOSCOPY (EGD) WITH PROPOFOL N/A 07/01/2015  Procedure: ESOPHAGOGASTRODUODENOSCOPY (EGD) WITH PROPOFOL;  Surgeon: Willis Modena, MD;  Location: [facility];  Service: Endoscopy;  Laterality: N/A; . ESOPHAGOGASTRODUODENOSCOPY (EGD) WITH PROPOFOL N/A 11/23/2015  Procedure: ESOPHAGOGASTRODUODENOSCOPY (EGD) WITH PROPOFOL;  Surgeon: Willis Modena, MD;  Location: [facility] ENDOSCOPY;  Service: Endoscopy;  Laterality: N/A;  may need to intubate . EYE SURGERY    growth on right eye removed  . IR GENERIC HISTORICAL  12/13/2015  IR REPLC DUODEN/JEJUNO TUBE PERCUT W/FLUORO 12/13/2015 [name], MD [facility]-INTER[facility] RAD . LAPAROSCOPIC GASTROSTOMY N/A 12/01/2015  Procedure: LAPAROSCOPIC PLACEMENT OF FEEDING JEJUNOSTOMY AND GASTROSTOMY TUBE;  Surgeon: Karie Soda, MD;  Location: [facility];  Service: General;  Laterality: N/A; . LAPAROSCOPY N/A 12/03/2015  Procedure: LAPAROSCOPY DIAGNOSTIC, OMENTOPEXY, JEJUNOSTOMY, WASH OUT;  Surgeon: Karie Soda, MD;  Location: [facility];  Service: General;  Laterality: N/A; . STENT TO HEART    2 1997 AND 1 IN 1996 . TONSILLECTOMY   . XI ROBOTIC VAGOTOMY AND ANTRECTOMY N/A 09/25/2015  Procedure: XI ROBOTIC ANTERIOR AND POSTERIOR VAGOTOMY, BILROTH I  ANASTOMOSIS DOR FUNDIPLICATION, OMENTOPEXY  UPPER ENDOSCOPY;  Surgeon: Karie Soda, MD;  Location:  [facility];  Service: General;  Laterality: N/A; HPI: 75 yo male former smoker with progressive epigastric pain, nausea, vomiting, bloating from gastric outlet obstruction with pyloric channel ulcer.  Had Billroth 1, vagotomy, fundoplication, omentopexy August 2017 with initial improvement.  Had recurrent symptoms September 2017 that become progressively worse with gastric dilation from recurrent stricture.  Had laparoscopic placement of jejunostomy tube, gastrostomy tube, and EGD balloon dilation of duodenal stricture 10/17.  Developed gastric leakage with peritonitis after pulling out PEG tube 10/19 and taken to OR. On 10/27 found 2 new LUQ abscesses/fluid collections which were drained percutaneously by IR. Had perc trach 10/27.  As of 10/30 his mental status has improved, he has remained off of vasopressors for several days and he has done well with weaning off the ventilator per MD note.  Subjective: pt awake in bed Assessment / Plan / Recommendation CHL IP CLINICAL IMPRESSIONS 12/17/2015 Therapy Diagnosis Moderate oral phase dysphagia;Moderate pharyngeal phase dysphagia;Moderate cervical esophageal phase dysphagia Clinical Impression Patient presents with moderate oropharyngeal dysphagia characterized by significant weakness resulting in residuals and aspiration.  Pt was only given minimal amount of thin and nectar barium during testing.  Of note, after patient was transferred to flouro chair, he demonstrated copious coughing.  He cleared scant secretions through trach tube at that time.  Transferring to chair and coughing episode significantly fatigued patient.  PMSV was placed for MBS with good tolerance during testing.  Unfortunately patient is grossly weak resulting in oropharyngeal deficits and secretion retention that mixed with barium.  Delayed oral transiting, lingual pumping and premature spillage of barium into pharynx noted due to weakness.  Compromised tongue base retraction and laryngeal elevation  allowed laryngeal penetration and SILENT aspiration both during and after the swallow.  Cues to cough/throat clear and dry swallow did not fully clear penetrates and patient quickly re-penetrated pharyngeal residuals.  In addition patient appeared with decreased proximal esophageal opening/decreased CP  opening resulting in residuals in pyriform sinus.  Unfortunately patient is at high risk of aspiration with intake at this time.  Using teach back/live video educated patient to findings and reinforced effective compensation strategies.   Recommend continue NPO except single ice chips with strict precautions - oral care, pmsv in place and cueing patient to cough/clear throat and re-swallow.  Pt will need repeat MBS in future due to silent nature of dysphagia. Will follow up for dysphagia treatment/management.   Thanks for allowing me to help care for this patient.  Impact on safety and function Severe aspiration risk;Risk for inadequate nutrition/hydration   CHL IP TREATMENT RECOMMENDATION 12/17/2015 Treatment Recommendations F/U MBS in --- days (Comment)   Prognosis 12/17/2015 Prognosis for Safe Diet Advancement Fair Barriers to Reach Goals Severity of deficits;Cognitive deficits Barriers/Prognosis Comment -- CHL IP DIET RECOMMENDATION 12/17/2015 SLP Diet Recommendations NPO;Ice chips PRN after oral care Liquid Administration via -- Medication Administration Via alternative means Compensations Minimize environmental distractions;Slow rate;Multiple dry swallows after each bite/sip;Hard cough after swallow Postural Changes --   CHL IP OTHER RECOMMENDATIONS 12/17/2015 Recommended Consults -- Oral Care Recommendations Oral care QID Other Recommendations --   CHL IP FOLLOW UP RECOMMENDATIONS 12/17/2015 Follow up Recommendations Skilled Nursing facility;Inpatient Rehab   CHL IP FREQUENCY AND DURATION 12/17/2015 Speech Therapy Frequency (ACUTE ONLY) min 2x/week Treatment Duration 2 weeks      CHL IP ORAL PHASE 12/17/2015 Oral  Phase Impaired Oral - Pudding Teaspoon -- Oral - Pudding Cup -- Oral - Honey Teaspoon -- Oral - Honey Cup -- Oral - Nectar Teaspoon -- Oral - Nectar Cup Weak lingual manipulation;Reduced posterior propulsion;Delayed oral transit;Decreased bolus cohesion;Lingual pumping Oral - Nectar Straw -- Oral - Thin Teaspoon Weak lingual manipulation;Delayed oral transit;Decreased bolus cohesion;Reduced posterior propulsion;Lingual pumping Oral - Thin Cup Weak lingual manipulation;Reduced posterior propulsion;Delayed oral transit;Decreased bolus cohesion;Lingual pumping Oral - Thin Straw Weak lingual manipulation;Decreased bolus cohesion;Delayed oral transit;Reduced posterior propulsion;Lingual pumping Oral - Puree -- Oral - Mech Soft -- Oral - Regular -- Oral - Multi-Consistency -- Oral - Pill -- Oral Phase - Comment --  CHL IP PHARYNGEAL PHASE 12/17/2015 Pharyngeal Phase Impaired Pharyngeal- Pudding Teaspoon -- Pharyngeal -- Pharyngeal- Pudding Cup -- Pharyngeal -- Pharyngeal- Honey Teaspoon -- Pharyngeal -- Pharyngeal- Honey Cup -- Pharyngeal -- Pharyngeal- Nectar Teaspoon Reduced laryngeal elevation;Pharyngeal residue - pyriform;Pharyngeal residue - valleculae;Reduced tongue base retraction;Reduced airway/laryngeal closure;Reduced epiglottic inversion;Penetration/Aspiration during swallow Pharyngeal Material enters airway, passes BELOW cords without attempt by patient to eject out (silent aspiration) Pharyngeal- Nectar Cup Reduced laryngeal elevation;Reduced tongue base retraction;Reduced anterior laryngeal mobility;Reduced epiglottic inversion;Reduced airway/laryngeal closure;Penetration/Aspiration during swallow Pharyngeal Material enters airway, passes BELOW cords without attempt by patient to eject out (silent aspiration) Pharyngeal- Nectar Straw -- Pharyngeal -- Pharyngeal- Thin Teaspoon Reduced laryngeal elevation;Reduced airway/laryngeal closure;Reduced tongue base retraction;Penetration/Aspiration during  swallow;Reduced epiglottic inversion;Trace aspiration;Pharyngeal residue - valleculae;Pharyngeal residue - pyriform;Pharyngeal residue - cp segment Pharyngeal Material enters airway, passes BELOW cords without attempt by patient to eject out (silent aspiration) Pharyngeal- Thin Cup Moderate aspiration;Penetration/Aspiration during swallow;Penetration/Apiration after swallow;Reduced pharyngeal peristalsis;Reduced epiglottic inversion;Reduced laryngeal elevation;Reduced airway/laryngeal closure;Reduced tongue base retraction Pharyngeal Material enters airway, passes BELOW cords without attempt by patient to eject out (silent aspiration) Pharyngeal- Thin Straw Reduced pharyngeal peristalsis;Reduced epiglottic inversion;Reduced anterior laryngeal mobility;Reduced laryngeal elevation;Reduced airway/laryngeal closure;Pharyngeal residue - valleculae;Pharyngeal residue - pyriform;Moderate aspiration;Penetration/Aspiration during swallow Pharyngeal Material enters airway, passes BELOW cords without attempt by patient to eject out (silent aspiration) Pharyngeal- Puree -- Pharyngeal -- Pharyngeal- Mechanical Soft -- Pharyngeal -- Pharyngeal-  Regular -- Pharyngeal -- Pharyngeal- Multi-consistency -- Pharyngeal -- Pharyngeal- Pill -- Pharyngeal -- Pharyngeal Comment pt without awareness to residuals, penetration and aspiration did not fully clear despite cued cough, pt also with aspiration of secretions mixed with barium  CHL IP CERVICAL ESOPHAGEAL PHASE 12/17/2015 Cervical Esophageal Phase Impaired Pudding Teaspoon -- Pudding Cup -- Honey Teaspoon -- Honey Cup -- Nectar Teaspoon Reduced cricopharyngeal relaxation;Prominent cricopharyngeal segment Nectar Cup Reduced cricopharyngeal relaxation;Prominent cricopharyngeal segment Nectar Straw -- Thin Teaspoon Reduced cricopharyngeal relaxation;Prominent cricopharyngeal segment Thin Cup Reduced cricopharyngeal relaxation;Prominent cricopharyngeal segment Thin Straw Reduced  cricopharyngeal relaxation;Prominent cricopharyngeal segment Puree -- Mechanical Soft -- Regular -- Multi-consistency -- Pill -- Cervical Esophageal Comment pt appeared with tight UES  Donavan Burnet, MS [facility] SLP [phone]               Assessment and Plan  TNP/ G TUBE FEEDING/ METABOLIC ACIDOSIS- TODAYS bmp DOES NOT YET REFLECT THE DECREASED FREE WATER TO 750 CC DAILY OR PT'S k+ BUT WE ARE STILL ok; NA=138, K+ 3/7; CL 106, CO2  18, BUN 20.5/Cr 0.75; plan to decrease osmolyte from 8 oz to 6 oz, this anxiety about feeling full after osmolyte goes in is a lot fear so I made a small concession;olan to start to inc freq of G tube feeding on Monday; pt's stool is formed, this is a a major win as he had diarrhea with anything up to now.; Acidosis is mildly improved, electrolytes look good  DYSPHAGIA - PT AS BEEN GETTING ICE CHIPS; spoke with speech therapy; pt had blue dye aspiration test and it was negative! Will start with liquids po on Monday; pt is going for trach check on Wed and are hoping they dec size of cannula  PROTEIN MALNUTRITION, SEVERE -  Alb was 1.8; TPN is saving the day nutritionally; will check pre-alb this week  GENERALIZED WEAKNESS - PT IS SITTING IN THE HALL WITH HIS WIFE WAITING TO GET HIS HAIR CUT; he looks stronger every day; plan to cont low but forward momentum     Labs/tests ordered: BMP  Time spent > 35 min;> 50% of time with patient was spent reviewing records, labs, tests and studies, counseling and developing plan of care  Thurston Hole D. [name], MD

## 2016-01-17 ENCOUNTER — Encounter: Payer: Self-pay | Admitting: Internal Medicine

## 2016-01-17 DIAGNOSIS — J690 Pneumonitis due to inhalation of food and vomit: Secondary | ICD-10-CM | POA: Insufficient documentation

## 2016-01-17 DIAGNOSIS — Z789 Other specified health status: Secondary | ICD-10-CM | POA: Insufficient documentation

## 2016-01-17 DIAGNOSIS — E872 Acidosis, unspecified: Secondary | ICD-10-CM | POA: Insufficient documentation

## 2016-01-17 DIAGNOSIS — R5381 Other malaise: Secondary | ICD-10-CM | POA: Insufficient documentation

## 2016-01-17 DIAGNOSIS — R1310 Dysphagia, unspecified: Secondary | ICD-10-CM | POA: Insufficient documentation

## 2016-01-17 DIAGNOSIS — L98491 Non-pressure chronic ulcer of skin of other sites limited to breakdown of skin: Secondary | ICD-10-CM | POA: Insufficient documentation

## 2016-01-17 HISTORY — DX: Pneumonitis due to inhalation of food and vomit: J69.0

## 2016-01-18 ENCOUNTER — Non-Acute Institutional Stay (SKILLED_NURSING_FACILITY): Payer: Medicare Other | Admitting: Internal Medicine

## 2016-01-18 DIAGNOSIS — Z931 Gastrostomy status: Secondary | ICD-10-CM | POA: Diagnosis not present

## 2016-01-18 DIAGNOSIS — Z789 Other specified health status: Secondary | ICD-10-CM | POA: Diagnosis not present

## 2016-01-18 DIAGNOSIS — E870 Hyperosmolality and hypernatremia: Secondary | ICD-10-CM | POA: Diagnosis not present

## 2016-01-19 ENCOUNTER — Non-Acute Institutional Stay (SKILLED_NURSING_FACILITY): Payer: Medicare Other | Admitting: Internal Medicine

## 2016-01-19 ENCOUNTER — Inpatient Hospital Stay (HOSPITAL_COMMUNITY)
Admission: EM | Admit: 2016-01-19 | Discharge: 2016-02-26 | DRG: 326 | Disposition: A | Discharge status: Rehabilitation Facility | Payer: Medicare Other | Attending: General Surgery | Admitting: General Surgery

## 2016-01-19 ENCOUNTER — Encounter: Payer: Self-pay | Admitting: Internal Medicine

## 2016-01-19 DIAGNOSIS — E43 Unspecified severe protein-calorie malnutrition: Secondary | ICD-10-CM | POA: Diagnosis not present

## 2016-01-19 DIAGNOSIS — Z7982 Long term (current) use of aspirin: Secondary | ICD-10-CM

## 2016-01-19 DIAGNOSIS — R1313 Dysphagia, pharyngeal phase: Secondary | ICD-10-CM | POA: Diagnosis present

## 2016-01-19 DIAGNOSIS — F411 Generalized anxiety disorder: Secondary | ICD-10-CM

## 2016-01-19 DIAGNOSIS — K567 Ileus, unspecified: Secondary | ICD-10-CM | POA: Diagnosis not present

## 2016-01-19 DIAGNOSIS — R112 Nausea with vomiting, unspecified: Secondary | ICD-10-CM | POA: Diagnosis not present

## 2016-01-19 DIAGNOSIS — G8929 Other chronic pain: Secondary | ICD-10-CM | POA: Diagnosis present

## 2016-01-19 DIAGNOSIS — K5651 Intestinal adhesions [bands], with partial obstruction: Secondary | ICD-10-CM | POA: Diagnosis not present

## 2016-01-19 DIAGNOSIS — N400 Enlarged prostate without lower urinary tract symptoms: Secondary | ICD-10-CM | POA: Diagnosis present

## 2016-01-19 DIAGNOSIS — J189 Pneumonia, unspecified organism: Secondary | ICD-10-CM

## 2016-01-19 DIAGNOSIS — M6281 Muscle weakness (generalized): Secondary | ICD-10-CM

## 2016-01-19 DIAGNOSIS — K651 Peritoneal abscess: Secondary | ICD-10-CM | POA: Diagnosis not present

## 2016-01-19 DIAGNOSIS — J9811 Atelectasis: Secondary | ICD-10-CM | POA: Diagnosis not present

## 2016-01-19 DIAGNOSIS — K219 Gastro-esophageal reflux disease without esophagitis: Secondary | ICD-10-CM | POA: Insufficient documentation

## 2016-01-19 DIAGNOSIS — I472 Ventricular tachycardia: Secondary | ICD-10-CM | POA: Diagnosis present

## 2016-01-19 DIAGNOSIS — Z931 Gastrostomy status: Secondary | ICD-10-CM

## 2016-01-19 DIAGNOSIS — B9620 Unspecified Escherichia coli [E. coli] as the cause of diseases classified elsewhere: Secondary | ICD-10-CM | POA: Diagnosis not present

## 2016-01-19 DIAGNOSIS — R1310 Dysphagia, unspecified: Secondary | ICD-10-CM

## 2016-01-19 DIAGNOSIS — R1311 Dysphagia, oral phase: Secondary | ICD-10-CM | POA: Diagnosis present

## 2016-01-19 DIAGNOSIS — J449 Chronic obstructive pulmonary disease, unspecified: Secondary | ICD-10-CM | POA: Diagnosis present

## 2016-01-19 DIAGNOSIS — Z7401 Bed confinement status: Secondary | ICD-10-CM

## 2016-01-19 DIAGNOSIS — Z789 Other specified health status: Secondary | ICD-10-CM | POA: Diagnosis present

## 2016-01-19 DIAGNOSIS — R633 Feeding difficulties, unspecified: Secondary | ICD-10-CM

## 2016-01-19 DIAGNOSIS — Z955 Presence of coronary angioplasty implant and graft: Secondary | ICD-10-CM

## 2016-01-19 DIAGNOSIS — R5381 Other malaise: Secondary | ICD-10-CM

## 2016-01-19 DIAGNOSIS — L8990 Pressure ulcer of unspecified site, unspecified stage: Secondary | ICD-10-CM | POA: Insufficient documentation

## 2016-01-19 DIAGNOSIS — R1110 Vomiting, unspecified: Secondary | ICD-10-CM

## 2016-01-19 DIAGNOSIS — R739 Hyperglycemia, unspecified: Secondary | ICD-10-CM | POA: Diagnosis not present

## 2016-01-19 DIAGNOSIS — I10 Essential (primary) hypertension: Secondary | ICD-10-CM | POA: Diagnosis present

## 2016-01-19 DIAGNOSIS — E872 Acidosis, unspecified: Secondary | ICD-10-CM

## 2016-01-19 DIAGNOSIS — E870 Hyperosmolality and hypernatremia: Secondary | ICD-10-CM | POA: Diagnosis not present

## 2016-01-19 DIAGNOSIS — Z6822 Body mass index (BMI) 22.0-22.9, adult: Secondary | ICD-10-CM

## 2016-01-19 DIAGNOSIS — T17990A Other foreign object in respiratory tract, part unspecified in causing asphyxiation, initial encounter: Secondary | ICD-10-CM | POA: Diagnosis not present

## 2016-01-19 DIAGNOSIS — K311 Adult hypertrophic pyloric stenosis: Secondary | ICD-10-CM | POA: Diagnosis not present

## 2016-01-19 DIAGNOSIS — K56600 Partial intestinal obstruction, unspecified as to cause: Secondary | ICD-10-CM | POA: Diagnosis present

## 2016-01-19 DIAGNOSIS — J90 Pleural effusion, not elsewhere classified: Secondary | ICD-10-CM | POA: Diagnosis not present

## 2016-01-19 DIAGNOSIS — G4701 Insomnia due to medical condition: Secondary | ICD-10-CM | POA: Diagnosis not present

## 2016-01-19 DIAGNOSIS — R627 Adult failure to thrive: Secondary | ICD-10-CM | POA: Diagnosis present

## 2016-01-19 DIAGNOSIS — D638 Anemia in other chronic diseases classified elsewhere: Secondary | ICD-10-CM | POA: Diagnosis present

## 2016-01-19 DIAGNOSIS — E46 Unspecified protein-calorie malnutrition: Secondary | ICD-10-CM

## 2016-01-19 DIAGNOSIS — Z87891 Personal history of nicotine dependence: Secondary | ICD-10-CM

## 2016-01-19 DIAGNOSIS — K632 Fistula of intestine: Secondary | ICD-10-CM

## 2016-01-19 DIAGNOSIS — L89152 Pressure ulcer of sacral region, stage 2: Secondary | ICD-10-CM | POA: Diagnosis present

## 2016-01-19 DIAGNOSIS — Z930 Tracheostomy status: Secondary | ICD-10-CM

## 2016-01-19 DIAGNOSIS — G4700 Insomnia, unspecified: Secondary | ICD-10-CM | POA: Insufficient documentation

## 2016-01-19 DIAGNOSIS — K9189 Other postprocedural complications and disorders of digestive system: Secondary | ICD-10-CM

## 2016-01-19 DIAGNOSIS — J9611 Chronic respiratory failure with hypoxia: Secondary | ICD-10-CM

## 2016-01-19 DIAGNOSIS — Z8711 Personal history of peptic ulcer disease: Secondary | ICD-10-CM

## 2016-01-19 DIAGNOSIS — L98429 Non-pressure chronic ulcer of back with unspecified severity: Secondary | ICD-10-CM

## 2016-01-19 DIAGNOSIS — K56609 Unspecified intestinal obstruction, unspecified as to partial versus complete obstruction: Secondary | ICD-10-CM

## 2016-01-19 DIAGNOSIS — Z79899 Other long term (current) drug therapy: Secondary | ICD-10-CM

## 2016-01-19 DIAGNOSIS — J690 Pneumonitis due to inhalation of food and vomit: Secondary | ICD-10-CM | POA: Diagnosis not present

## 2016-01-19 DIAGNOSIS — I2510 Atherosclerotic heart disease of native coronary artery without angina pectoris: Secondary | ICD-10-CM | POA: Diagnosis present

## 2016-01-19 DIAGNOSIS — IMO0002 Reserved for concepts with insufficient information to code with codable children: Secondary | ICD-10-CM

## 2016-01-19 DIAGNOSIS — E785 Hyperlipidemia, unspecified: Secondary | ICD-10-CM | POA: Diagnosis present

## 2016-01-19 DIAGNOSIS — G9340 Encephalopathy, unspecified: Secondary | ICD-10-CM | POA: Diagnosis present

## 2016-01-19 DIAGNOSIS — H18459 Nodular corneal degeneration, unspecified eye: Secondary | ICD-10-CM | POA: Diagnosis present

## 2016-01-19 DIAGNOSIS — E876 Hypokalemia: Secondary | ICD-10-CM | POA: Diagnosis present

## 2016-01-19 DIAGNOSIS — B961 Klebsiella pneumoniae [K. pneumoniae] as the cause of diseases classified elsewhere: Secondary | ICD-10-CM | POA: Diagnosis not present

## 2016-01-19 DIAGNOSIS — L98491 Non-pressure chronic ulcer of skin of other sites limited to breakdown of skin: Secondary | ICD-10-CM | POA: Diagnosis present

## 2016-01-19 DIAGNOSIS — T17908A Unspecified foreign body in respiratory tract, part unspecified causing other injury, initial encounter: Secondary | ICD-10-CM

## 2016-01-19 HISTORY — DX: Choleperitonitis: K65.3

## 2016-01-19 HISTORY — DX: Adult hypertrophic pyloric stenosis: K31.1

## 2016-01-19 HISTORY — DX: Other specified diseases of intestine: K63.89

## 2016-01-19 NOTE — ED Triage Notes (Addendum)
[facility] is [facility] Patient is complaining of abd pain, vomiting, and abd pain. Patient has a PICC line in his right arm and receiving TPN with lipids at 7990mL/min for his sodium/potassium metabolic acidosis and has G-tube.

## 2016-01-19 NOTE — Progress Notes (Signed)
Location:  [facility] Room Number: [phone] Place of Service:  SNF [phone])  [name]. [name], MD  Patient Care Team: [name], MD as PCP - General (Internal Medicine) Karie Soda, MD as Consulting Physician (General Surgery) Willis Modena, MD as Consulting Physician (Gastroenterology) Coralyn Helling, MD as Consulting Physician (Pulmonary Disease)  Extended Emergency Contact Information Primary Emergency Contact: [name] Address: [address]          [place], [place] [address] [name] Home Phone: [phone] Mobile Phone: [phone] Relation: Spouse Secondary Emergency Contact: [name] [place] of [place] Mobile Phone: [phone] Relation: Daughter    Allergies: Flomax [tamsulosin hcl]; Lisinopril; Lorazepam; and Uroxatral [alfuzosin hcl er]  Chief Complaint  Patient presents with  . Acute Visit    Acute    HPI: Patient is 75 y.o. male who is being seen today to cont to be followed for his Na+ , K+, metabolic acidosis, TPN and attempts to get him taking orals, which liquids he can safely do. Per ST pt ate oatmeal today but he has had diarrhea today also.He tried to drink Boost breeze but he has drunk so much after his surgery in August that he really doesn't like it. Pt also asked me if he could have something for sleep. His wife has concern he is not getting his afternoon Haldol. Recall pt is using haldol as anxiolytic as benzos  eliicit an idiosyncratic reaction.  Past Medical History:  Diagnosis Date  . Bilateral dry eyes   . BPH (benign prostatic hyperplasia)   . Central pterygium of right eye   . Coronary artery disease 1997, 2009   bare mental stent right coronart artery, occlusive followed by Dr. Garnette Scheuermann in [place]  . Degenerative disc disease    LOWER BACK  . Dysphonia   . ED (erectile dysfunction)   . Gastroesophageal reflux disease   . Hyperlipidemia   . Hyperopia of both  eyes with astigmatism and presbyopia   . Hypertension   . Nuclear senile cataract    [facility]. [facility] in [place]  . Peptic ulcer   . Pneumonia    HISTORY OF IN CHILDHOOD  . Posterior vitreous detachment 10/30/2012  . Salzmann's nodular dystrophy of left eye   . Urinary hesitancy   . Wears glasses     Past Surgical History:  Procedure Laterality Date  . CARDIAC CATHETERIZATION    . CHOLECYSTECTOMY    . ESOPHAGOGASTRODUODENOSCOPY N/A 12/01/2015   Procedure: ESOPHAGOGASTRODUODENOSCOPY (EGD) BALLOON DILATION OF DUODENAL STRICTURE;  Surgeon: Karie Soda, MD;  Location: [facility];  Service: General;  Laterality: N/A;  . ESOPHAGOGASTRODUODENOSCOPY N/A 12/14/2015   Procedure: ESOPHAGOGASTRODUODENOSCOPY (EGD);  Surgeon: Vida Rigger, MD;  Location: [facility];  Service: Endoscopy;  Laterality: N/A;  Patient has tracheostomy  . ESOPHAGOGASTRODUODENOSCOPY (EGD) WITH PROPOFOL N/A 07/01/2015   Procedure: ESOPHAGOGASTRODUODENOSCOPY (EGD) WITH PROPOFOL;  Surgeon: Willis Modena, MD;  [facility]: [facility];  Service: Endoscopy;  Laterality: N/A;  . ESOPHAGOGASTRODUODENOSCOPY (EGD) WITH PROPOFOL N/A 11/23/2015   Procedure: ESOPHAGOGASTRODUODENOSCOPY (EGD) WITH PROPOFOL;  Surgeon: Willis Modena, MD;  Location: [facility] ENDOSCOPY;  Service: Endoscopy;  Laterality: N/A;  may need to intubate  . EYE SURGERY     growth on right eye removed   . IR GENERIC HISTORICAL  12/13/2015   IR REPLC DUODEN/JEJUNO TUBE PERCUT W/FLUORO 12/13/2015 Danny Click, MD [facility]  . LAPAROSCOPIC GASTROSTOMY N/A 12/01/2015   Procedure: LAPAROSCOPIC PLACEMENT OF FEEDING JEJUNOSTOMY AND GASTROSTOMY TUBE;  Surgeon: Karie Soda, MD;  Location: [facility];  Service: General;  Laterality: N/A;  . LAPAROSCOPY N/A 12/03/2015   Procedure: LAPAROSCOPY DIAGNOSTIC, OMENTOPEXY, JEJUNOSTOMY, WASH OUT;  Surgeon: Karie Soda, MD;  Location: [facility];  Service: General;  Laterality: N/A;  . STENT TO HEART     2 1997 AND 1 IN 1996  .  TONSILLECTOMY    . XI ROBOTIC VAGOTOMY AND ANTRECTOMY N/A 09/25/2015   Procedure: XI ROBOTIC ANTERIOR AND POSTERIOR VAGOTOMY, BILROTH I  ANASTOMOSIS DOR FUNDIPLICATION, OMENTOPEXY  UPPER ENDOSCOPY;  Surgeon: Karie Soda, MD;  Location: [facility];  Service: General;  Laterality: N/A;      Medication List       Accurate as of 01/19/16  2:22 PM. Always use your most recent med list.          aluminum-magnesium hydroxide-simethicone 200-200-20 MG/5ML Susp Commonly known as:  MAALOX Take 30 mLs by mouth every 6 (six) hours as needed.   aspirin 81 MG chewable tablet Place 81 mg into feeding tube every evening.   atorvastatin 40 MG tablet Commonly known as:  LIPITOR Place 40 mg into feeding tube at bedtime.   bisacodyl 10 MG suppository Commonly known as:  DULCOLAX Place 10 mg rectally daily as needed for moderate constipation or severe constipation.   bismuth subsalicylate 262 MG/15ML suspension Commonly known as:  PEPTO BISMOL Place 30 mLs into feeding tube every 8 (eight) hours as needed for indigestion or diarrhea or loose stools.   calcium-vitamin D 500-200 MG-UNIT tablet Commonly known as:  OSCAL WITH D Place 1 tablet into feeding tube daily with breakfast.   CLINIMIX E/DEXTROSE (2.75/10) 2.75 % Soln Inject 2,160 mLs into the vein as needed.   feeding supplement (OSMOLITE 1.2 CAL) Liqd Place 237 mLs into feeding tube. Give 1 can via G-tube every day at  10 am and  6 pm.   Ferrous Sulfate 220 (44 Fe) MG/5ML Liqd Place 7.5 mLs into feeding tube 3 (three) times daily.   FLUoxetine 20 MG/5ML solution Commonly known as:  PROZAC Place 20 mg into feeding tube daily.   haloperidol 0.5 MG tablet Commonly known as:  HALDOL Take 0.5 mg by mouth 2 (two) times daily. At 6 am and 4:30 pm.   haloperidol 0.5 MG tablet Commonly known as:  HALDOL Place 0.5 mg into feeding tube as needed for agitation. STOP DATE 01/24/2016   loperamide 1 MG/5ML solution Commonly known as:   IMODIUM 10 mg. Take 10 mls via tube every 8 hours as needed for more than 2 bms every 8 hours.   magic mouthwash Soln Take 15 mLs by mouth 4 (four) times daily as needed for mouth pain (sore throat).   metoCLOPramide 5 MG/5ML solution Commonly known as:  REGLAN Place 10 mg into feeding tube every 6 (six) hours as needed for nausea or vomiting.   metoprolol tartrate 25 MG tablet Commonly known as:  LOPRESSOR Place 25 mg into feeding tube daily.   nicotine 7 mg/24hr patch Commonly known as:  NICODERM CQ - dosed in mg/24 hr Place 7 mg onto the skin daily. Rotate site   nitroGLYCERIN 0.4 MG SL tablet Commonly known as:  NITROSTAT Place 0.4 mg under the tongue every 5 (five) minutes as needed for chest pain.   omeprazole 40 MG capsule Commonly known as:  PRILOSEC Take 40 mg by mouth 2 (two) times daily.   REFRESH OP Place 2 drops into both eyes as needed (For dry eyes.).   RESTASIS 0.05 %  ophthalmic emulsion Generic drug:  cycloSPORINE Place 1 drop into both eyes 2 (two) times daily.   sterile water for irrigation Irrigate with 250 mLs as directed every 6 (six) hours.   traZODone 50 MG tablet Commonly known as:  DESYREL Place 50 mg into feeding tube at bedtime.   Vitamin B-12 1000 MCG Subl Place 1 tablet under the tongue daily with breakfast.       No orders of the defined types were placed in this encounter.   Immunization History  Administered Date(s) Administered  . PPD Test 01/13/2016    Social History  Substance Use Topics  . Smoking status: Former Smoker    Packs/day: 2.00    Years: 20.00    Types: Cigarettes    Quit date: 02/15/1995  . Smokeless tobacco: Never Used  . Alcohol use No    Review of Systems  DATA OBTAINED: from patient, wife, ST, nursing GENERAL:  no fevers, fatigue, poor appetite-is receiving TPN SKIN: No itching, rash HEENT: No complaint RESPIRATORY: No cough, wheezing, SOB CARDIAC: No chest pain, palpitations, lower extremity  edema  GI: + abdominal pain after osmolyte, No N/V, diarrhea reported after osmolyte yesterday;  no constipation, No heartburn or reflux  GU: No dysuria, frequency or urgency, or incontinence  MUSCULOSKELETAL: No unrelieved bone/joint pain NEUROLOGIC: No headache, dizziness  PSYCHIATRIC: No overt anxiety or sadness  Vitals:   01/19/16 1419  BP: (!) 112/58  Pulse: 84  Resp: 18  Temp: 97 F (36.1 C)   Body mass index is 22.4 kg/m. Physical Exam  GENERAL APPEARANCE: Alert, conversant, No acute distress  SKIN: No diaphoresis rash HEENT: Unremarkable RESPIRATORY: Breathing is even, unlabored. Lung sounds are clear   CARDIOVASCULAR: Heart RRR no murmurs, rubs or gallops. No peripheral edema  GASTROINTESTINAL: Abdomen is soft, non-tender, not distended w/ normal bowel sounds right before pt refluxed in front of me, starting with a cough and throat clearing then reflux of green clear with tiny particulates, no odor; of course there is nothing as useful as a hemocult card here GENITOURINARY: Bladder non tender, not distended  MUSCULOSKELETAL: No abnormal joints or musculature NEUROLOGIC: Cranial nerves 2-12 grossly intact. Moves all extremities PSYCHIATRIC: Mood and affect appropriate to situation, no behavioral issues  Patient Active Problem List   Diagnosis Date Noted  . Aspiration pneumonia (HCC) 01/17/2016  . On total parenteral nutrition (TPN) 01/17/2016  . Metabolic acidosis 01/17/2016  . Skin ulcer of abdominal wall, limited to breakdown of skin (HCC) 01/17/2016  . Dysphagia 01/17/2016  . Generalized weakness 01/17/2016  . 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 Vitamin B12 deficiency 01/02/2016  . BPH (benign prostatic hyperplasia)   . Posterior vitreous detachment   . Salzmann's nodular dystrophy of left eye   . Hyperopia of both eyes with astigmatism and presbyopia   . Central pterygium of right eye   . Pressure ulcer of buttock, right, unstageable (HCC)  12/30/2015  . Hypernatremia 12/28/2015  . Tracheostomy in place [facility](HCC) 12/25/2015  . Pneumatosis of intestines 12/11/2015  . Tobacco abuse 12/09/2015  . Ischemic right index finger at tip 12/09/2015  . Gastritis 12/09/2015  . Gastric tube present (LUQ) 12/09/2015  . Jejunostomy tube present (LLQ) 12/09/2015  . Acute respiratory failure with hypoxemia (HCC)   . Protein-calorie malnutrition, severe 12/04/2015  . Encephalopathy acute 12/03/2015  . Bile peritonitis due to gastric tube dislodgement 12/03/2015  . Gastric Ileus  11/25/2015  . Gastric outlet obstruction 11/24/2015  . Pyloric stricture 11/24/2015  .  Anxiety state 09/28/2015  . Hyperlipidemia   . 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 Coronary atherosclerosis of native coronary artery 04/24/2013  . Essential hypertension, benign 04/24/2013  . Other and unspecified hyperlipidemia 04/24/2013  . Central pterygium 10/30/2012  . 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 Cataract, nuclear sclerotic senile 10/30/2012    CMP     Component Value Date/Time   NA 139 01/14/2016   K 3.8 01/14/2016   CL 123 (H) 12/29/2015 0900   CO2 23 12/29/2015 0900   GLUCOSE 117 (H) 12/29/2015 0900   BUN 23 (A) 01/14/2016   CREATININE 0.8 01/14/2016   CREATININE 1.44 (H) 12/29/2015 0900   CALCIUM 7.5 (L) 12/29/2015 0900   PROT 5.8 (L) 12/28/2015 0419   ALBUMIN 1.6 (L) 12/28/2015 0419   AST 42 (H) 12/28/2015 0419   ALT 51 12/28/2015 0419   ALKPHOS 340 (H) 12/28/2015 0419   BILITOT 0.8 12/28/2015 0419   GFRNONAA 46 (L) 12/29/2015 0900   GFRAA 53 (L) 12/29/2015 0900    Recent Labs  12/21/15 0530  12/24/15 0435  12/27/15 0348  12/28/15 0419 12/29/15 12/29/15 0900 01/14/16  NA 147*  < > 152*  < > 155*  < > 154* 153* 153* 139  K 3.5  < > 3.8  < > 3.7  < > 3.7  --  3.8 3.8  CL 115*  < > 121*  < > 125*  --  124*  --  123*  --   CO2 24  < > 23  < > 24  --  24   --  23  --   GLUCOSE 118*  < > 140*  < > 133*  --  132*  --  117*  --   BUN 40*  < > 58*  < > 79*  < > 69* 63* 63* 23*  CREATININE 1.06  < > 1.19  < > 1.33*  < > 1.25* 1.4* 1.44* 0.8  CALCIUM 8.4*  < > 8.0*  < > 7.3*  --  7.3*  --  7.5*  --   MG 2.0  --  2.2  --   --   --  2.1  --   --   --   PHOS 4.3  --  3.4  --   --   --  2.8  --   --   --   < > = values in this interval not displayed.  Recent Labs  12/21/15 0530 12/24/15 0435 12/28/15 0419  AST 31 43* 42*  ALT 28 44 51  ALKPHOS 212* 305* 340*  BILITOT 1.4* 1.1 0.8  PROT 6.1* 6.2* 5.8*  ALBUMIN 1.6* 1.6* 1.6*    Recent Labs  12/14/15 0549 12/15/15 0424 12/18/15 1159 12/21/15 0530 12/29/15 12/29/15 0900 01/14/16  WBC 7.7 10.2 10.6* 8.9 15.3 15.3* 8.0  NEUTROABS 5.1 7.0  --  6.1  --   --   --   HGB 10.3* 8.8* 8.6* 8.4*  --  7.8* 11.2*  HCT 31.0* 26.4* 26.0* 25.9*  --  24.7* 35*  MCV 88.8 89.2 87.5 89.0  --  89.8  --   PLT 516* 633* 573* 473*  --  509* 290    Recent Labs  12/14/15 0549 12/21/15 0530 01/11/16  TRIG 194* 199* 177*   No results found for: MICROALBUR No results found for: TSH No results found for: HGBA1C Lab Results  Component Value Date  TRIG 177 (A) 01/11/2016    Significant Diagnostic Results in last 30 days:  Dg Swallowing Func-speech Pathology  Result Date: 12/29/2015 Objective Swallowing Evaluation: Type of Study: MBS-Modified Barium Swallow Study Patient Details Name: [name] MRN: [id number] Date of Birth: December 14, 1940 Today's Date: 12/29/2015 Time: SLP Start Time (ACUTE ONLY): 0905-SLP Stop Time (ACUTE ONLY): 0940 SLP Time Calculation (min) (ACUTE ONLY): 35 min Past Medical History: Past Medical History: Diagnosis Date . Bilateral dry eyes  . Coronary artery disease 1997  bare mental stent right coronart artery . Degenerative disc disease   LOWER BACK . Dysphonia  . ED (erectile dysfunction)  . Gastroesophageal reflux disease  . Hyperlipidemia  . Hypertension  . Peptic ulcer  .  Pneumonia   HISTORY OF IN CHILDHOOD . Posterior vitreous detachment 10/30/2012 . Urinary hesitancy  . Wears glasses  Past Surgical History: Past Surgical History: Procedure Laterality Date . CARDIAC CATHETERIZATION   . CHOLECYSTECTOMY   . ESOPHAGOGASTRODUODENOSCOPY N/A 12/01/2015  Procedure: ESOPHAGOGASTRODUODENOSCOPY (EGD) BALLOON DILATION OF DUODENAL STRICTURE;  Surgeon: Karie Soda, MD;  Location: [facility];  Service: General;  Laterality: N/A; . ESOPHAGOGASTRODUODENOSCOPY N/A 12/14/2015  Procedure: ESOPHAGOGASTRODUODENOSCOPY (EGD);  Surgeon: Vida Rigger, MD;  Location: [facility];  Service: Endoscopy;  Laterality: N/A;  Patient has tracheostomy . ESOPHAGOGASTRODUODENOSCOPY (EGD) WITH PROPOFOL N/A 07/01/2015  Procedure: ESOPHAGOGASTRODUODENOSCOPY (EGD) WITH PROPOFOL;  Surgeon: Willis Modena, MD;  [facility];  Service: Endoscopy;  Laterality: N/A; . ESOPHAGOGASTRODUODENOSCOPY (EGD) WITH PROPOFOL N/A 11/23/2015  Procedure: ESOPHAGOGASTRODUODENOSCOPY (EGD) WITH PROPOFOL;  Surgeon: Willis Modena, MD;  Location: [facility] ENDOSCOPY;  Service: Endoscopy;  Laterality: N/A;  may need to intubate . EYE SURGERY    growth on right eye removed  . IR GENERIC HISTORICAL  12/13/2015  IR REPLC DUODEN/JEJUNO TUBE PERCUT W/FLUORO 12/13/2015 Danny Click, MD [facility] . LAPAROSCOPIC GASTROSTOMY N/A 12/01/2015  Procedure: LAPAROSCOPIC PLACEMENT OF FEEDING JEJUNOSTOMY AND GASTROSTOMY TUBE;  Surgeon: Karie Soda, MD;  [facility]: [facility];  Service: General;  Laterality: N/A; . LAPAROSCOPY N/A 12/03/2015  Procedure: LAPAROSCOPY DIAGNOSTIC, OMENTOPEXY, JEJUNOSTOMY, WASH OUT;  Surgeon: Karie Soda, MD;  Location: [facility];  Service: General;  Laterality: N/A; . STENT TO HEART    2 1997 AND 1 IN 1996 . TONSILLECTOMY   . XI ROBOTIC VAGOTOMY AND ANTRECTOMY N/A 09/25/2015  Procedure: XI ROBOTIC ANTERIOR AND POSTERIOR VAGOTOMY, BILROTH I  ANASTOMOSIS DOR FUNDIPLICATION, OMENTOPEXY  UPPER ENDOSCOPY;  Surgeon: Karie Soda, MD;  Location: [facility];  Service: General;  Laterality: N/A; HPI: 75 yo male former smoker with progressive epigastric pain, nausea, vomiting, bloating from gastric outlet obstruction with pyloric channel ulcer.  Had Billroth 1, vagotomy, fundoplication, omentopexy August 2017 with initial improvement.  Had recurrent symptoms September 2017 that become progressively worse with gastric dilation from recurrent stricture.  Had laparoscopic placement of jejunostomy tube, gastrostomy tube, and EGD balloon dilation of duodenal stricture 10/17.  Developed gastric leakage with peritonitis after pulling out PEG tube 10/19 and taken to OR. On 10/27 found 2 new LUQ abscesses/fluid collections which were drained percutaneously by IR. Had perc trach 10/27.  As of 10/30 his mental status has improved.  Pt underwent MBS and has been on TPN since due to level of dysphagia.  Ice chips have been consumed with improved tolerance.  MD ordered repeat MBS.   Subjective: pt awake in bed, weak Assessment / Plan / Recommendation CHL IP CLINICAL IMPRESSIONS 12/29/2015 Therapy Diagnosis Moderate oral phase dysphagia;Moderate pharyngeal phase dysphagia;Moderate cervical esophageal phase dysphagia Clinical Impression Patient continues  with moderate oropharyngeal dysphagia that appears marginally worse than prior MBS 12/17/15 results.  Swallow function continues to be significantly weak resulting in residuals and aspiration with poor cough mechanism.   Pt again fatigued during MBS and at times had difficulty keeping his eyes open.   Viscous green tinged secretions coughed through trach (blew off PMSV) after oral care and before barium administration.  Pt also with copious secretions coughed AROUND trach following MBS *pt had PMSV in place.   Gross weakness secretion retention that mixed with barium without consistent awareness.  Delayed oral transiting, lingual pumping and premature spillage of barium into pharynx noted due to weakness.  Compromised tongue base  retraction and laryngeal elevation allowed laryngeal penetration and SILENT aspiration both during and after the swallow.  Cues to cough/throat clear and dry swallow did not fully clear penetrates and patient quickly re-penetrated pharyngeal residuals.  In addition patient appeared with decreased proximal esophageal opening/decreased CP opening resulting in residuals in pyriform sinus.  Unfortunately patient is at high risk of aspiration with intake at this time.  Using teach back/live video educated patient and daughter to findings and reinforced effective compensation strategies.   Recommend continue NPO except single ice chips with strict precautions - oral care, pmsv in place and cueing patient to cough/clear throat and re-swallow.  Pt also with increase in delayed responses to SLP instructions/cues today- and states "I'm weak".  Given pt's lack of swallow improvement, SlP is concerned for swallow prognosis during acute stay.  Pt will need repeat MBS in future due to silent nature of dysphagia. Will follow up for dysphagia treatment/management.   Thanks for allowing me to help care for this patient.  Impact on safety and function Severe aspiration risk;Risk for inadequate nutrition/hydration   CHL IP TREATMENT RECOMMENDATION 12/29/2015 Treatment Recommendations F/U MBS in --- days (Comment)   Prognosis 12/29/2015 Prognosis for Safe Diet Advancement Guarded Barriers to Reach Goals Severity of deficits;Cognitive deficits Barriers/Prognosis Comment -- CHL IP DIET RECOMMENDATION 12/29/2015 SLP Diet Recommendations NPO;Ice chips PRN after oral care Liquid Administration via -- Medication Administration Via alternative means Compensations Minimize environmental distractions;Slow rate;Multiple dry swallows after each bite/sip;Hard cough after swallow Postural Changes --   CHL IP OTHER RECOMMENDATIONS 12/29/2015 Recommended Consults -- Oral Care Recommendations Oral care QID Other Recommendations --   CHL IP FOLLOW UP  RECOMMENDATIONS 12/29/2015 Follow up Recommendations Skilled Nursing facility;Inpatient Rehab   CHL IP FREQUENCY AND DURATION 12/29/2015 Speech Therapy Frequency (ACUTE ONLY) min 2x/week Treatment Duration 2 weeks      CHL IP ORAL PHASE 12/29/2015 Oral Phase Impaired Oral - Pudding Teaspoon -- Oral - Pudding Cup -- Oral - Honey Teaspoon -- Oral - Honey Cup -- Oral - Nectar Teaspoon -- Oral - Nectar Cup -- Oral - Nectar Straw -- Oral - Thin Teaspoon Weak lingual manipulation;Reduced posterior propulsion;Lingual/palatal residue;Decreased bolus cohesion;Delayed oral transit;Premature spillage Oral - Thin Cup NT Oral - Thin Straw NT Oral - Puree Weak lingual manipulation;Lingual/palatal residue;Reduced posterior propulsion;Premature spillage;Decreased bolus cohesion;Delayed oral transit Oral - Mech Soft -- Oral - Regular -- Oral - Multi-Consistency -- Oral - Pill -- Oral Phase - Comment --  CHL IP PHARYNGEAL PHASE 12/29/2015 Pharyngeal Phase Impaired Pharyngeal- Pudding Teaspoon -- Pharyngeal -- Pharyngeal- Pudding Cup -- Pharyngeal -- Pharyngeal- Honey Teaspoon -- Pharyngeal -- Pharyngeal- Honey Cup -- Pharyngeal -- Pharyngeal- Nectar Teaspoon Reduced pharyngeal peristalsis;Reduced epiglottic inversion;Reduced anterior laryngeal mobility;Reduced laryngeal elevation;Reduced airway/laryngeal closure;Reduced tongue base retraction;Penetration/Aspiration during swallow;Pharyngeal residue - valleculae;Lateral channel residue;Pharyngeal residue - cp  segment Pharyngeal Material enters airway, remains ABOVE vocal cords and not ejected out Pharyngeal- Nectar Cup Delayed swallow initiation-pyriform sinuses;Reduced pharyngeal peristalsis;Reduced epiglottic inversion;Reduced anterior laryngeal mobility;Reduced laryngeal elevation;Reduced airway/laryngeal closure;Reduced tongue base retraction;Penetration/Aspiration during swallow;Penetration/Apiration after swallow;Significant aspiration (Amount);Pharyngeal residue -  valleculae;Pharyngeal residue - pyriform;Lateral channel residue;Pharyngeal residue - cp segment Pharyngeal Material enters airway, passes BELOW cords without attempt by patient to eject out (silent aspiration);Material enters airway, passes BELOW cords and not ejected out despite cough attempt by patient Pharyngeal- Nectar Straw -- Pharyngeal -- Pharyngeal- Thin Teaspoon Delayed swallow initiation-vallecula;Reduced pharyngeal peristalsis;Reduced epiglottic inversion;Reduced anterior laryngeal mobility;Reduced laryngeal elevation;Reduced airway/laryngeal closure;Reduced tongue base retraction;Penetration/Aspiration during swallow;Penetration/Aspiration before swallow;Pharyngeal residue - valleculae;Lateral channel residue;Trace aspiration;Pharyngeal residue - cp segment Pharyngeal Material enters airway, passes BELOW cords without attempt by patient to eject out (silent aspiration) Pharyngeal- Thin Cup NT Pharyngeal -- Pharyngeal- Thin Straw NT Pharyngeal -- Pharyngeal- Puree Delayed swallow initiation-vallecula;Reduced epiglottic inversion;Reduced pharyngeal peristalsis;Reduced anterior laryngeal mobility;Reduced laryngeal elevation;Reduced airway/laryngeal closure;Reduced tongue base retraction;Pharyngeal residue - valleculae;Pharyngeal residue - pyriform Pharyngeal -- Pharyngeal- Mechanical Soft -- Pharyngeal -- Pharyngeal- Regular -- Pharyngeal -- Pharyngeal- Multi-consistency -- Pharyngeal -- Pharyngeal- Pill -- Pharyngeal -- Pharyngeal Comment pt continues with gross residuals and poor awareness, WEAK dry swallow/throat clear and cough were ineffective to fully clear aspiration/penetration/residuals, pt did have viscous colored secretions *green tinged* coughed and expectorated from trach prior to MBS and around the trach AFTER   CHL IP CERVICAL ESOPHAGEAL PHASE 12/29/2015 Cervical Esophageal Phase -- Pudding Teaspoon -- Pudding Cup -- Honey Teaspoon -- Honey Cup -- Nectar Teaspoon Prominent cricopharyngeal  segment;Reduced cricopharyngeal relaxation Nectar Cup Prominent cricopharyngeal segment;Reduced cricopharyngeal relaxation Nectar Straw -- Thin Teaspoon Reduced cricopharyngeal relaxation;Prominent cricopharyngeal segment Thin Cup NT Thin Straw NT Puree Reduced cricopharyngeal relaxation;Prominent cricopharyngeal segment Mechanical Soft -- Regular -- Multi-consistency -- Pill -- Cervical Esophageal Comment -- No flowsheet data found. Donavan Burnet, MS [facility] SLP [phone]               Assessment and Plan  HYPERNATREMIA/METABOLIC ACIDOSIS -  NA+ 134, K+ 3.8, CL  101, CO2 23, improved with new TPN with NaAcetate in it  SEVERE PROTEIN MALNUTRITION  - Alb - 1.7, Pre-albumin 11, TG 126; numbers look awful, pt after a week of TPN is looking better and he's getting nourishment while we work with his GI tract.  GERD - this is new for here, looks like bile, I increased his omeprazole to 40 mg BID yesterday; clear liquids only for now  INSOMNIA/ANXIETY - pt is trazodone 50 mg, have increased it to 100 mg qHS; clarified pt to have Haldol 0.25 at 6a, 4p and 0.5 at 9p; haldol is being used as anxiolytic     Time spent > 35 min Harlow Basley D. [name], MD

## 2016-01-19 NOTE — ED Notes (Signed)
Bed: [id number] Expected date:  Expected time:  Means of arrival:  Comments: 75 yo M  Nausea, vomiting

## 2016-01-20 ENCOUNTER — Emergency Department (HOSPITAL_COMMUNITY): Payer: Medicare Other

## 2016-01-20 ENCOUNTER — Inpatient Hospital Stay (HOSPITAL_COMMUNITY): Admit: 2016-01-20 | Payer: Medicare Other

## 2016-01-20 ENCOUNTER — Encounter (HOSPITAL_COMMUNITY): Payer: Self-pay

## 2016-01-20 ENCOUNTER — Inpatient Hospital Stay (HOSPITAL_COMMUNITY): Payer: Medicare Other

## 2016-01-20 DIAGNOSIS — R739 Hyperglycemia, unspecified: Secondary | ICD-10-CM | POA: Diagnosis not present

## 2016-01-20 DIAGNOSIS — K311 Adult hypertrophic pyloric stenosis: Secondary | ICD-10-CM | POA: Diagnosis not present

## 2016-01-20 DIAGNOSIS — F418 Other specified anxiety disorders: Secondary | ICD-10-CM | POA: Diagnosis not present

## 2016-01-20 DIAGNOSIS — Z930 Tracheostomy status: Secondary | ICD-10-CM | POA: Diagnosis not present

## 2016-01-20 DIAGNOSIS — J90 Pleural effusion, not elsewhere classified: Secondary | ICD-10-CM | POA: Diagnosis not present

## 2016-01-20 DIAGNOSIS — R5381 Other malaise: Secondary | ICD-10-CM | POA: Diagnosis not present

## 2016-01-20 DIAGNOSIS — I472 Ventricular tachycardia: Secondary | ICD-10-CM | POA: Diagnosis present

## 2016-01-20 DIAGNOSIS — G9340 Encephalopathy, unspecified: Secondary | ICD-10-CM | POA: Diagnosis not present

## 2016-01-20 DIAGNOSIS — Z934 Other artificial openings of gastrointestinal tract status: Secondary | ICD-10-CM | POA: Diagnosis not present

## 2016-01-20 DIAGNOSIS — L8990 Pressure ulcer of unspecified site, unspecified stage: Secondary | ICD-10-CM | POA: Insufficient documentation

## 2016-01-20 DIAGNOSIS — K56600 Partial intestinal obstruction, unspecified as to cause: Secondary | ICD-10-CM | POA: Diagnosis present

## 2016-01-20 DIAGNOSIS — K632 Fistula of intestine: Secondary | ICD-10-CM | POA: Diagnosis not present

## 2016-01-20 DIAGNOSIS — R1311 Dysphagia, oral phase: Secondary | ICD-10-CM | POA: Diagnosis present

## 2016-01-20 DIAGNOSIS — E872 Acidosis: Secondary | ICD-10-CM | POA: Diagnosis present

## 2016-01-20 DIAGNOSIS — K56609 Unspecified intestinal obstruction, unspecified as to partial versus complete obstruction: Secondary | ICD-10-CM

## 2016-01-20 DIAGNOSIS — J9811 Atelectasis: Secondary | ICD-10-CM | POA: Diagnosis not present

## 2016-01-20 DIAGNOSIS — L89152 Pressure ulcer of sacral region, stage 2: Secondary | ICD-10-CM | POA: Diagnosis present

## 2016-01-20 DIAGNOSIS — R1313 Dysphagia, pharyngeal phase: Secondary | ICD-10-CM | POA: Diagnosis present

## 2016-01-20 DIAGNOSIS — J9621 Acute and chronic respiratory failure with hypoxia: Secondary | ICD-10-CM | POA: Diagnosis not present

## 2016-01-20 DIAGNOSIS — Z430 Encounter for attention to tracheostomy: Secondary | ICD-10-CM | POA: Diagnosis not present

## 2016-01-20 DIAGNOSIS — R531 Weakness: Secondary | ICD-10-CM | POA: Diagnosis not present

## 2016-01-20 DIAGNOSIS — K5651 Intestinal adhesions [bands], with partial obstruction: Secondary | ICD-10-CM | POA: Diagnosis present

## 2016-01-20 DIAGNOSIS — G4700 Insomnia, unspecified: Secondary | ICD-10-CM | POA: Diagnosis present

## 2016-01-20 DIAGNOSIS — K219 Gastro-esophageal reflux disease without esophagitis: Secondary | ICD-10-CM | POA: Diagnosis present

## 2016-01-20 DIAGNOSIS — K651 Peritoneal abscess: Secondary | ICD-10-CM | POA: Diagnosis not present

## 2016-01-20 DIAGNOSIS — J690 Pneumonitis due to inhalation of food and vomit: Secondary | ICD-10-CM | POA: Diagnosis not present

## 2016-01-20 DIAGNOSIS — D638 Anemia in other chronic diseases classified elsewhere: Secondary | ICD-10-CM | POA: Diagnosis not present

## 2016-01-20 DIAGNOSIS — E43 Unspecified severe protein-calorie malnutrition: Secondary | ICD-10-CM | POA: Diagnosis present

## 2016-01-20 DIAGNOSIS — F411 Generalized anxiety disorder: Secondary | ICD-10-CM | POA: Diagnosis not present

## 2016-01-20 DIAGNOSIS — J9611 Chronic respiratory failure with hypoxia: Secondary | ICD-10-CM | POA: Diagnosis not present

## 2016-01-20 DIAGNOSIS — R0902 Hypoxemia: Secondary | ICD-10-CM | POA: Diagnosis not present

## 2016-01-20 DIAGNOSIS — E785 Hyperlipidemia, unspecified: Secondary | ICD-10-CM | POA: Diagnosis present

## 2016-01-20 DIAGNOSIS — R112 Nausea with vomiting, unspecified: Secondary | ICD-10-CM | POA: Diagnosis present

## 2016-01-20 DIAGNOSIS — N400 Enlarged prostate without lower urinary tract symptoms: Secondary | ICD-10-CM | POA: Diagnosis present

## 2016-01-20 DIAGNOSIS — I10 Essential (primary) hypertension: Secondary | ICD-10-CM

## 2016-01-20 DIAGNOSIS — E876 Hypokalemia: Secondary | ICD-10-CM | POA: Diagnosis present

## 2016-01-20 DIAGNOSIS — R1310 Dysphagia, unspecified: Secondary | ICD-10-CM | POA: Diagnosis not present

## 2016-01-20 DIAGNOSIS — L98491 Non-pressure chronic ulcer of skin of other sites limited to breakdown of skin: Secondary | ICD-10-CM | POA: Diagnosis present

## 2016-01-20 DIAGNOSIS — R627 Adult failure to thrive: Secondary | ICD-10-CM | POA: Diagnosis not present

## 2016-01-20 DIAGNOSIS — I2510 Atherosclerotic heart disease of native coronary artery without angina pectoris: Secondary | ICD-10-CM | POA: Diagnosis present

## 2016-01-20 HISTORY — DX: Unspecified intestinal obstruction, unspecified as to partial versus complete obstruction: K56.609

## 2016-01-20 LAB — CBC WITH DIFFERENTIAL/PLATELET
BASOS ABS: 0 10*3/uL (ref 0.0–0.1)
Basophils Relative: 0 %
EOS PCT: 0 %
Eosinophils Absolute: 0.1 10*3/uL (ref 0.0–0.7)
HCT: 25 % — ABNORMAL LOW (ref 39.0–52.0)
Hemoglobin: 8.1 g/dL — ABNORMAL LOW (ref 13.0–17.0)
LYMPHS PCT: 36 %
Lymphs Abs: 4.5 10*3/uL — ABNORMAL HIGH (ref 0.7–4.0)
MCH: 29.3 pg (ref 26.0–34.0)
MCHC: 32.4 g/dL (ref 30.0–36.0)
MCV: 90.6 fL (ref 78.0–100.0)
Monocytes Absolute: 1.4 10*3/uL — ABNORMAL HIGH (ref 0.1–1.0)
Monocytes Relative: 11 %
NEUTROS ABS: 6.7 10*3/uL (ref 1.7–7.7)
NEUTROS PCT: 53 %
PLATELETS: 366 10*3/uL (ref 150–400)
RBC: 2.76 MIL/uL — AB (ref 4.22–5.81)
RDW: 17.9 % — ABNORMAL HIGH (ref 11.5–15.5)
WBC: 12.7 10*3/uL — AB (ref 4.0–10.5)

## 2016-01-20 LAB — GLUCOSE, CAPILLARY: GLUCOSE-CAPILLARY: 163 mg/dL — AB (ref 65–99)

## 2016-01-20 LAB — URINALYSIS, ROUTINE W REFLEX MICROSCOPIC
Bilirubin Urine: NEGATIVE
GLUCOSE, UA: NEGATIVE mg/dL
HGB URINE DIPSTICK: NEGATIVE
Ketones, ur: NEGATIVE mg/dL
Leukocytes, UA: NEGATIVE
Nitrite: NEGATIVE
PH: 5 (ref 5.0–8.0)
Protein, ur: NEGATIVE mg/dL
SPECIFIC GRAVITY, URINE: 1.013 (ref 1.005–1.030)

## 2016-01-20 LAB — COMPREHENSIVE METABOLIC PANEL
ALT: 14 U/L — AB (ref 17–63)
AST: 21 U/L (ref 15–41)
Albumin: 1.6 g/dL — ABNORMAL LOW (ref 3.5–5.0)
Alkaline Phosphatase: 111 U/L (ref 38–126)
Anion gap: 5 (ref 5–15)
BUN: 17 mg/dL (ref 6–20)
CHLORIDE: 102 mmol/L (ref 101–111)
CO2: 26 mmol/L (ref 22–32)
CREATININE: 0.69 mg/dL (ref 0.61–1.24)
Calcium: 7.6 mg/dL — ABNORMAL LOW (ref 8.9–10.3)
GFR calc Af Amer: >60 mL/min (ref >60)
Glucose, Bld: 210 mg/dL — ABNORMAL HIGH (ref 65–99)
Potassium: 3.9 mmol/L (ref 3.5–5.1)
Sodium: 133 mmol/L — ABNORMAL LOW (ref 135–145)
Total Bilirubin: 0.5 mg/dL (ref 0.3–1.2)
Total Protein: 5.6 g/dL — ABNORMAL LOW (ref 6.5–8.1)

## 2016-01-20 LAB — I-STAT CG4 LACTIC ACID, ED: Lactic Acid, Venous: 0.92 mmol/L (ref 0.5–1.9)

## 2016-01-20 LAB — LIPASE, BLOOD: LIPASE: 55 U/L — AB (ref 11–51)

## 2016-01-20 MED ORDER — IOPAMIDOL (ISOVUE-300) INJECTION 61%
100.0000 mL | Freq: Once | INTRAVENOUS | Status: AC | PRN
  Administered 2016-01-20: 100 mL via INTRAVENOUS

## 2016-01-20 MED ORDER — FAT EMULSION 20 % IV EMUL: 240.0000 mL | INTRAVENOUS | Status: DC

## 2016-01-20 MED ORDER — HEPARIN SODIUM (PORCINE) 5000 UNIT/ML IJ SOLN
5000.0000 [IU] | Freq: Three times a day (TID) | INTRAMUSCULAR | Status: DC
  Administered 2016-01-20 – 2016-02-04 (×45): 5000 [IU] via SUBCUTANEOUS
  Filled 2016-01-20 (×46): qty 1

## 2016-01-20 MED ORDER — CEFEPIME HCL 1 G IJ SOLR
1.0000 g | Freq: Once | INTRAMUSCULAR | Status: AC
  Administered 2016-01-20: 1 g via INTRAVENOUS
  Filled 2016-01-20: qty 1

## 2016-01-20 MED ORDER — ALUM & MAG HYDROXIDE-SIMETH 200-200-20 MG/5ML PO SUSP: 30.0000 mL | Freq: Four times a day (QID) | ORAL | Status: DC | PRN

## 2016-01-20 MED ORDER — FAT EMULSION 20 % IV EMUL
240.0000 mL | INTRAVENOUS | Status: AC
  Administered 2016-01-20: 240 mL via INTRAVENOUS
  Filled 2016-01-20: qty 250

## 2016-01-20 MED ORDER — SODIUM CHLORIDE 0.9 % IJ SOLN
INTRAMUSCULAR | Status: AC
  Filled 2016-01-20: qty 50

## 2016-01-20 MED ORDER — IOPAMIDOL (ISOVUE-300) INJECTION 61%
INTRAVENOUS | Status: AC
  Administered 2016-01-20: 100 mL via INTRAVENOUS
  Filled 2016-01-20: qty 100

## 2016-01-20 MED ORDER — BISACODYL 10 MG RE SUPP: 10.0000 mg | Freq: Every day | RECTAL | Status: DC | PRN

## 2016-01-20 MED ORDER — MORPHINE SULFATE (PF) 2 MG/ML IV SOLN
1.0000 mg | INTRAVENOUS | Status: DC | PRN
  Administered 2016-01-21 – 2016-01-30 (×29): 1 mg via INTRAVENOUS
  Filled 2016-01-20 (×29): qty 1

## 2016-01-20 MED ORDER — MAGIC MOUTHWASH
15.0000 mL | Freq: Four times a day (QID) | ORAL | Status: DC | PRN
  Filled 2016-01-20: qty 15

## 2016-01-20 MED ORDER — LACTATED RINGERS IV BOLUS (SEPSIS)
1000.0000 mL | Freq: Once | INTRAVENOUS | Status: AC
  Administered 2016-01-20: 1000 mL via INTRAVENOUS

## 2016-01-20 MED ORDER — INSULIN ASPART 100 UNIT/ML ~~LOC~~ SOLN
0.0000 [IU] | Freq: Four times a day (QID) | SUBCUTANEOUS | Status: DC
  Administered 2016-01-20: 2 [IU] via SUBCUTANEOUS
  Administered 2016-01-21 – 2016-01-26 (×9): 1 [IU] via SUBCUTANEOUS

## 2016-01-20 MED ORDER — DIPHENHYDRAMINE HCL 50 MG/ML IJ SOLN: 12.5000 mg | Freq: Four times a day (QID) | INTRAMUSCULAR | Status: DC | PRN

## 2016-01-20 MED ORDER — LACTATED RINGERS IV BOLUS (SEPSIS): 1000.0000 mL | Freq: Three times a day (TID) | INTRAVENOUS | Status: AC | PRN

## 2016-01-20 MED ORDER — CLINIMIX E/DEXTROSE (5/20) 5 % IV SOLN: INTRAVENOUS | Status: DC

## 2016-01-20 MED ORDER — KCL IN DEXTROSE-NACL 20-5-0.45 MEQ/L-%-% IV SOLN
INTRAVENOUS | Status: AC
  Administered 2016-01-20: 11:00:00 via INTRAVENOUS
  Filled 2016-01-20: qty 1000

## 2016-01-20 MED ORDER — LIDOCAINE VISCOUS 2 % MT SOLN
15.0000 mL | Freq: Once | OROMUCOSAL | Status: AC
  Administered 2016-01-20: 15 mL via OROMUCOSAL
  Filled 2016-01-20: qty 15

## 2016-01-20 MED ORDER — LIP MEDEX EX OINT
1.0000 "application " | TOPICAL_OINTMENT | Freq: Two times a day (BID) | CUTANEOUS | Status: DC
  Administered 2016-01-20 – 2016-02-26 (×71): 1 via TOPICAL
  Filled 2016-01-20 (×3): qty 7

## 2016-01-20 MED ORDER — ONDANSETRON HCL 4 MG/2ML IJ SOLN
4.0000 mg | Freq: Once | INTRAMUSCULAR | Status: AC
  Administered 2016-01-20: 4 mg via INTRAVENOUS
  Filled 2016-01-20: qty 2

## 2016-01-20 MED ORDER — PROCHLORPERAZINE EDISYLATE 5 MG/ML IJ SOLN
5.0000 mg | INTRAMUSCULAR | Status: DC | PRN
  Administered 2016-02-02 – 2016-02-18 (×2): 10 mg via INTRAVENOUS
  Filled 2016-01-20 (×2): qty 2

## 2016-01-20 MED ORDER — SODIUM CHLORIDE 0.9% FLUSH
10.0000 mL | INTRAVENOUS | Status: DC | PRN
  Administered 2016-01-20: 20 mL
  Administered 2016-01-21 – 2016-01-30 (×7): 10 mL
  Administered 2016-01-31: 30 mL
  Administered 2016-01-31 – 2016-02-01 (×3): 10 mL
  Administered 2016-02-01: 20 mL
  Administered 2016-02-02 – 2016-02-04 (×2): 10 mL
  Administered 2016-02-06: 06:00:00 30 mL
  Administered 2016-02-07: 20 mL
  Administered 2016-02-09 – 2016-02-16 (×2): 10 mL
  Administered 2016-02-18: 20 mL
  Administered 2016-02-20 – 2016-02-23 (×4): 10 mL
  Administered 2016-02-23: 20 mL
  Administered 2016-02-23 (×2): 10 mL
  Administered 2016-02-25: 40 mL
  Filled 2016-01-20 (×28): qty 40

## 2016-01-20 MED ORDER — KCL IN DEXTROSE-NACL 20-5-0.45 MEQ/L-%-% IV SOLN
INTRAVENOUS | Status: DC
  Administered 2016-01-20: 18:00:00 via INTRAVENOUS
  Filled 2016-01-20: qty 1000

## 2016-01-20 MED ORDER — ZINC OXIDE 40 % EX OINT
TOPICAL_OINTMENT | Freq: Two times a day (BID) | CUTANEOUS | Status: DC
  Administered 2016-01-20 – 2016-01-27 (×14): via TOPICAL
  Administered 2016-01-28 (×2): 1 via TOPICAL
  Administered 2016-01-29 – 2016-02-08 (×18): via TOPICAL
  Administered 2016-02-08: 1 via TOPICAL
  Administered 2016-02-09 – 2016-02-10 (×3): via TOPICAL
  Administered 2016-02-11: 1 via TOPICAL
  Administered 2016-02-11 – 2016-02-21 (×16): via TOPICAL
  Administered 2016-02-21: 1 via TOPICAL
  Administered 2016-02-22 – 2016-02-26 (×9): via TOPICAL
  Filled 2016-01-20 (×3): qty 114

## 2016-01-20 MED ORDER — PANTOPRAZOLE SODIUM 40 MG IV SOLR
40.0000 mg | Freq: Two times a day (BID) | INTRAVENOUS | Status: DC
  Administered 2016-01-20 – 2016-01-30 (×22): 40 mg via INTRAVENOUS
  Filled 2016-01-20 (×22): qty 40

## 2016-01-20 MED ORDER — VANCOMYCIN HCL IN DEXTROSE 1-5 GM/200ML-% IV SOLN
1000.0000 mg | Freq: Two times a day (BID) | INTRAVENOUS | Status: DC
  Administered 2016-01-20 – 2016-01-21 (×2): 1000 mg via INTRAVENOUS
  Filled 2016-01-20 (×3): qty 200

## 2016-01-20 MED ORDER — BISACODYL 10 MG RE SUPP
10.0000 mg | Freq: Every day | RECTAL | Status: DC
  Administered 2016-01-21 – 2016-01-27 (×5): 10 mg via RECTAL
  Filled 2016-01-20 (×7): qty 1

## 2016-01-20 MED ORDER — PHENOL 1.4 % MT LIQD
2.0000 | OROMUCOSAL | Status: DC | PRN
  Filled 2016-01-20: qty 177

## 2016-01-20 MED ORDER — METOPROLOL TARTRATE 5 MG/5ML IV SOLN: 5.0000 mg | Freq: Four times a day (QID) | INTRAVENOUS | Status: DC | PRN

## 2016-01-20 MED ORDER — MENTHOL 3 MG MT LOZG
1.0000 | LOZENGE | OROMUCOSAL | Status: DC | PRN
  Filled 2016-01-20: qty 9

## 2016-01-20 MED ORDER — TRACE MINERALS CR-CU-MN-SE-ZN 10-1000-500-60 MCG/ML IV SOLN
INTRAVENOUS | Status: AC
  Administered 2016-01-20: 16:00:00 via INTRAVENOUS
  Filled 2016-01-20: qty 1800

## 2016-01-20 MED ORDER — HALOPERIDOL LACTATE 5 MG/ML IJ SOLN: 0.5000 mg | Freq: Four times a day (QID) | INTRAMUSCULAR | Status: DC | PRN

## 2016-01-20 MED ORDER — MORPHINE SULFATE (PF) 4 MG/ML IV SOLN
4.0000 mg | Freq: Once | INTRAVENOUS | Status: AC
  Administered 2016-01-20: 4 mg via INTRAVENOUS
  Filled 2016-01-20: qty 1

## 2016-01-20 MED ORDER — HALOPERIDOL LACTATE 5 MG/ML IJ SOLN
2.0000 mg | Freq: Four times a day (QID) | INTRAMUSCULAR | Status: DC | PRN
  Administered 2016-01-21: 2 mg via INTRAVENOUS
  Administered 2016-01-22 – 2016-01-31 (×10): 5 mg via INTRAVENOUS
  Administered 2016-02-18: 2 mg via INTRAVENOUS
  Administered 2016-02-23: 2.5 mg via INTRAVENOUS
  Administered 2016-02-23 – 2016-02-26 (×4): 5 mg via INTRAVENOUS
  Filled 2016-01-20 (×20): qty 1

## 2016-01-20 MED ORDER — METOCLOPRAMIDE HCL 5 MG/ML IJ SOLN
5.0000 mg | Freq: Four times a day (QID) | INTRAMUSCULAR | Status: DC | PRN
  Administered 2016-01-21: 10 mg via INTRAVENOUS
  Filled 2016-01-20 (×2): qty 2

## 2016-01-20 MED ORDER — PROMETHAZINE HCL 25 MG/ML IJ SOLN: 6.2500 mg | Freq: Four times a day (QID) | INTRAMUSCULAR | Status: DC | PRN

## 2016-01-20 MED ORDER — ONDANSETRON HCL 4 MG/2ML IJ SOLN
4.0000 mg | Freq: Four times a day (QID) | INTRAMUSCULAR | Status: DC | PRN
Start: 2016-01-20 — End: 2016-02-22
  Administered 2016-01-26 – 2016-02-08 (×5): 4 mg via INTRAVENOUS
  Filled 2016-01-20 (×6): qty 2

## 2016-01-20 NOTE — Consult Note (Signed)
Medical Consultation   HENOK HEACOCK  ZOX:[id number]  DOB: 24-Oct-1940  DOA: 01/19/2016  PCP: Lillia Mountain, MD  Requesting physician: gen surgery   Reason for consultation: Pneumonia   History of Present Illness: Patient is a 75 year old male with GERD, CAD, PUD presented to ED from SNF from skilled nursing facility. Per daughter at the bedside and, patient recently had major abdominal surgery (anterior and posterior vagotomy, fundoplication and omentopexy in August 2017) secondary to pyloric stricture followed by jejunostomy and gastrostomy tube. However patient continued to have complications, and in October/17 had jejunostomy tube pulled out with gastric leak.  Per daughter, patient had multiple episodes of nausea and vomiting in the last 2 days the diarrhea. He has been receiving tube feeding until 2 days ago. No hematemesis or melena was noted. He has been in skilled nursing facility. He does not ambulate has been bedbound secondary to weakness. In ED, Sodium 133, potassium 3.9, BUN 17, creatinine 0.69, WBCs 12.7, hemoglobin 8.1 CT abdomen and pelvis showed a small bowel obstruction involving the edema up anterior aspect of the right lower quadrant probably due to adhesion. moderate left pleural effusion, small right pleural effusion. Chest x-ray showed Bilateral pleural effusions, left lower lobe consolidation could represent pneumonia or atelectasis  Review of Systems:Positives marked in 'bold'  Unable to obtain review of systems from the patient due to his mental status, lethargic, weakness and majority of the history are reported by patient's wife and the daughter at the bedside   Allergies:   Allergies  Allergen Reactions  . Flomax [Tamsulosin Hcl] Other (See Comments)    Leg weakness  . Lisinopril Cough  . Lorazepam     "i don't like it"  Prefers Xanax or valium  . Uroxatral [Alfuzosin Hcl Er] Other (See Comments)    Leg weakness      Past Medical History:   Diagnosis Date  . Bilateral dry eyes   . BPH (benign prostatic hyperplasia)   . Central pterygium of right eye   . Coronary artery disease 1997, 2009   bare mental stent right coronart artery, occlusive followed by Dr. Garnette Scheuermann in [place]  . Degenerative disc disease    LOWER BACK  . Dysphonia   . ED (erectile dysfunction)   . Gastroesophageal reflux disease   . Hyperlipidemia   . Hyperopia of both eyes with astigmatism and presbyopia   . Hypertension   . Nuclear senile cataract    Dr. [facility] in [place]  . Peptic ulcer   . Pneumonia    HISTORY OF IN CHILDHOOD  . Posterior vitreous detachment 10/30/2012  . Salzmann's nodular dystrophy of left eye   . Urinary hesitancy   . Wears glasses     Past Surgical History:  Procedure Laterality Date  . CARDIAC CATHETERIZATION    . CHOLECYSTECTOMY    . ESOPHAGOGASTRODUODENOSCOPY N/A 12/01/2015   Procedure: ESOPHAGOGASTRODUODENOSCOPY (EGD) BALLOON DILATION OF DUODENAL STRICTURE;  Surgeon: Karie Soda, MD;  Location: [facility];  Service: General;  Laterality: N/A;  . ESOPHAGOGASTRODUODENOSCOPY N/A 12/14/2015   Procedure: ESOPHAGOGASTRODUODENOSCOPY (EGD);  Surgeon: Vida Rigger, MD;  Location: [facility];  Service: Endoscopy;  Laterality: N/A;  Patient has tracheostomy  . ESOPHAGOGASTRODUODENOSCOPY (EGD) WITH PROPOFOL N/A 07/01/2015   Procedure: ESOPHAGOGASTRODUODENOSCOPY (EGD) WITH PROPOFOL;  Surgeon: Willis Modena, MD;  Location: [facility];  Service: Endoscopy;  Laterality: N/A;  . ESOPHAGOGASTRODUODENOSCOPY (EGD) WITH PROPOFOL N/A 11/23/2015   Procedure: ESOPHAGOGASTRODUODENOSCOPY (EGD) WITH PROPOFOL;  Surgeon: Willis Modena, MD;  [facility]  ENDOSCOPY;  Service: Endoscopy;  Laterality: N/A;  may need to intubate  . EYE SURGERY     growth on right eye removed   . IR GENERIC HISTORICAL  12/13/2015   IR REPLC DUODEN/JEJUNO TUBE PERCUT W/FLUORO 12/13/2015 Jolaine ClickArthur Hoss, MD [facility]  . LAPAROSCOPIC GASTROSTOMY  N/A 12/01/2015   Procedure: LAPAROSCOPIC PLACEMENT OF FEEDING JEJUNOSTOMY AND GASTROSTOMY TUBE;  Surgeon: Karie SodaSteven Gross, MD;  Location: [facility];  Service: General;  Laterality: N/A;  . LAPAROSCOPY N/A 12/03/2015   Procedure: LAPAROSCOPY DIAGNOSTIC, OMENTOPEXY, JEJUNOSTOMY, WASH OUT;  Surgeon: Karie SodaSteven Gross, MD;  Location: [facility];  Service: General;  Laterality: N/A;  . STENT TO HEART     2 1997 AND 1 IN 1996  . TONSILLECTOMY    . XI ROBOTIC VAGOTOMY AND ANTRECTOMY N/A 09/25/2015   Procedure: XI ROBOTIC ANTERIOR AND POSTERIOR VAGOTOMY, BILROTH I  ANASTOMOSIS DOR FUNDIPLICATION, OMENTOPEXY  UPPER ENDOSCOPY;  Surgeon: Karie SodaSteven Gross, MD;  Location: [facility];  Service: General;  Laterality: N/A;    Social History:  Per chart review, reports that he quit smoking about 20 years ago. His smoking use included Cigarettes. He has a 40.00 pack-year smoking history. He has never used smokeless tobacco. He reports that he does not drink alcohol or use drugs.  Family History  Problem Relation Age of Onset  . Heart attack Mother        Physical Exam: Blood pressure 116/60, pulse 86, temperature 98.6 F (37 C), temperature source Oral, resp. rate 16, SpO2 92 %.   Constitutional: Alert and awake, oriented x3, not in any acute distress, frail. Eyes: PERLA, EOMI, irises appear normal, anicteric sclera,  ENMT: external ears and nose appear normal, Lips appears normal, oropharynx mucosa, tongue, posterior pharynx appear normal  Neck: trach in place , normal ROM, no JVD  CVS: S1-S2 clear, no murmur rubs or gallops, no LE edema, normal pedal pulses  Respiratory: Diminished breath sounds at the bases, poor effort. No accessory muscle use.  Abdomen: soft nontender, nondistended, normal bowel sounds, no hepatosplenomegaly,G tube in place  Musculoskeletal: : no cyanosis, clubbing or edema noted bilaterally Neuro: Cranial nerves II-XII intact, strength, 5/5 bilateral upper and lower extremities  Psych: judgement and  insight appear normal, stable mood and affect Skin: no rashes or lesions or ulcers, no induration or nodules    Labs Data:  Basic Metabolic Panel:  Recent Labs Lab 01/14/16 01/20/16 0158  NA 139 133*  K 3.8 3.9  CL  --  102  CO2  --  26  GLUCOSE  --  210*  BUN 23* 17  CREATININE 0.8 0.69  CALCIUM  --  7.6*   Liver Function Tests:  Recent Labs Lab 01/20/16 0158  AST 21  ALT 14*  ALKPHOS 111  BILITOT 0.5  PROT 5.6*  ALBUMIN 1.6*    Recent Labs Lab 01/20/16 0158  LIPASE 55*   No results for input(s): AMMONIA in the last 168 hours. CBC:  Recent Labs Lab 01/14/16 01/20/16 0158  WBC 8.0 12.7*  NEUTROABS  --  6.7  HGB 11.2* 8.1*  HCT 35* 25.0*  MCV  --  90.6  PLT 290 366   Cardiac Enzymes: No results for input(s): CKTOTAL, CKMB, CKMBINDEX, TROPONINI in the last 168 hours. BNP: Invalid input(s): POCBNP CBG: No results for input(s): GLUCAP in the last 168 hours.  Inpatient Medications:   Scheduled Meds: . lidocaine  15 mL Mouth/Throat Once  . sodium chloride       Continuous Infusions: .  vancomycin 1,000 mg (01/20/16 [id number])     Radiological Exams on Admission: Dg Chest 2 View  Result Date: 01/20/2016 CLINICAL DATA:  Vomiting for 48 hours. EXAM: CHEST  2 VIEW COMPARISON:  12/11/2015 FINDINGS: Tracheostomy appliance is centered on the tracheal air column. Right upper extremity PICC line appears satisfactorily positioned. There are pleural effusions bilaterally. Probable consolidation in the left lower lobe. IMPRESSION: Bilateral pleural effusions. Left lower lobe consolidation could represent pneumonia or atelectasis. Electronically Signed   By: Ellery Plunkaniel R Mitchell M.D.   On: 01/20/2016 02:54   Ct Abdomen Pelvis W Contrast  Result Date: 01/20/2016 CLINICAL DATA:  Nausea vomiting for 2 days. Mid abdominal pain. Leukocytosis. EXAM: CT ABDOMEN AND PELVIS WITH CONTRAST TECHNIQUE: Multidetector CT imaging of the abdomen and pelvis was performed using the  standard protocol following bolus administration of intravenous contrast. CONTRAST:  100 mL Isovue-300 intravenous COMPARISON:  12/10/2015 FINDINGS: Lower chest: Moderate left pleural effusion. Small right pleural effusion. Atelectatic appearing lung base opacities bilaterally Hepatobiliary: No focal liver abnormality is seen. Status post cholecystectomy. No biliary dilatation. Pancreas: Unremarkable. No pancreatic ductal dilatation or surrounding inflammatory changes. Spleen: Normal in size without focal abnormality. Adrenals/Urinary Tract: Adrenal glands are unremarkable. Kidneys are normal, without renal calculi, focal lesion, or hydronephrosis. Bladder is unremarkable. Stomach/Bowel: There is abnormal dilatation of small bowel with abrupt transition to decompressed ileum. Transition point is in the abdominal right lower quadrant anteriorly. This likely represents a small bowel obstruction due to adhesions. No extraluminal air. Colon is remarkable only for uncomplicated diverticulosis. Stomach is mildly distended. Percutaneous gastrostomy appears satisfactorily positioned. Vascular/Lymphatic: The abdominal aorta is normal in caliber with moderate atherosclerotic calcification. No adenopathy in the abdomen or pelvis. Reproductive: Unremarkable Other: Scant volume ascites.  No drainable peritoneal collections. Musculoskeletal: No significant skeletal lesion. IMPRESSION: 1. Small bowel obstruction involving ileum at the anterior aspect of the right lower quadrant, probably due to adhesion. 2. Moderate left pleural effusion.  Small right pleural effusion. 3. Colonic diverticulosis. Electronically Signed   By: Ellery Plunkaniel R Mitchell M.D.   On: 01/20/2016 05:34    Impression/Recommendations Principal Problem:   SBO (small bowel obstruction) - Likely secondary to adhesions, multiple surgeries in the last 2 months - Per primary team, general surgery  Active Problems:   Essential hypertension, benign  - Currently BP  borderline soft, will follow    Gastric outlet obstruction, Gastric tube present (LUQ) - Per surgery    Aspiration pneumonia (HCC), HCAP in the setting of intractable nausea and vomiting and tracheostomy - will also check influenza panel - Check urine legionella antigen, urine strep antigen  - Placed on IV vancomycin and cefepime - Gen. surgery consulting pulmonology regarding tracheostomy, agree   [facility] will follow the patient, thank you for this consultation.  Time Spent 50mins   RAI,RIPUDEEP M.D. [facility] 01/20/2016, 8:04 AM

## 2016-01-20 NOTE — ED Notes (Signed)
Pt's PICC line is not appropriate for CT contrast, assessed pt to insert PIV, not successful, will page IV team

## 2016-01-20 NOTE — ED Provider Notes (Signed)
[facility]-EMERGENCY DEPT Provider Note   CSN: [id number] Arrival date & time: 01/19/16  2229     History   Chief Complaint Chief Complaint  Patient presents with  . Abdominal Pain  . Headache    HPI [name] is a 75 y.o. male.  Patient presents to the emergency department with a chief complaint of cough, nausea, vomiting, diarrhea, and abdominal pain. He has recently undergone multiple abdominal surgeries. He is currently staying at [facility] center. Currently receiving nutrition from G-tube, but attempted oral feedings today, and subsequently had severe vomiting. Has advised the patient comes emergency department for further evaluation. He reports moderate pain in his abdomen. Denies any known fever. Denies any dysuria. He states that he has had a cough since being discharged from the hospital.   The history is provided by the patient. No language interpreter was used.    Past Medical History:  Diagnosis Date  . Bilateral dry eyes   . BPH (benign prostatic hyperplasia)   . Central pterygium of right eye   . Coronary artery disease 1997, 2009   bare mental stent right coronart artery, occlusive followed by Dr. Garnette Scheuermann in [place]  . Degenerative disc disease    LOWER BACK  . Dysphonia   . ED (erectile dysfunction)   . Gastroesophageal reflux disease   . Hyperlipidemia   . Hyperopia of both eyes with astigmatism and presbyopia   . Hypertension   . Nuclear senile cataract    Dr. [facility] in [place]  . Peptic ulcer   . Pneumonia    HISTORY OF IN CHILDHOOD  . Posterior vitreous detachment 10/30/2012  . Salzmann's nodular dystrophy of left eye   . Urinary hesitancy   . Wears glasses     Patient Active Problem List   Diagnosis Date Noted  . Gastroesophageal reflux disease 01/19/2016  . Insomnia 01/19/2016  . Aspiration pneumonia (HCC) 01/17/2016  . On total parenteral nutrition (TPN) 01/17/2016  . Metabolic acidosis  01/17/2016  . Skin ulcer of abdominal wall, limited to breakdown of skin (HCC) 01/17/2016  . Dysphagia 01/17/2016  . Generalized weakness 01/17/2016  . 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 Vitamin B12 deficiency 01/02/2016  . BPH (benign prostatic hyperplasia)   . Posterior vitreous detachment   . Salzmann's nodular dystrophy of left eye   . Hyperopia of both eyes with astigmatism and presbyopia   . Central pterygium of right eye   . Pressure ulcer of buttock, right, unstageable (HCC) 12/30/2015  . Hypernatremia 12/28/2015  . Tracheostomy in place [facility]) 12/25/2015  . Pneumatosis of intestines 12/11/2015  . Tobacco abuse 12/09/2015  . Ischemic right index finger at tip 12/09/2015  . Gastritis 12/09/2015  . Gastric tube present (LUQ) 12/09/2015  . Jejunostomy tube present (LLQ) 12/09/2015  . Acute respiratory failure with hypoxemia (HCC)   . Protein-calorie malnutrition, severe 12/04/2015  . Encephalopathy acute 12/03/2015  . Bile peritonitis due to gastric tube dislodgement 12/03/2015  . Gastric Ileus  11/25/2015  . Gastric outlet obstruction 11/24/2015  . Pyloric stricture 11/24/2015  . Anxiety state 09/28/2015  . Hyperlipidemia   . 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 Coronary atherosclerosis of native coronary artery 04/24/2013  . Essential hypertension, benign 04/24/2013  . Other and unspecified hyperlipidemia 04/24/2013  . Central pterygium 10/30/2012  . 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 Cataract, nuclear sclerotic senile 10/30/2012    Past Surgical History:  Procedure Laterality Date  . CARDIAC CATHETERIZATION    . CHOLECYSTECTOMY    . ESOPHAGOGASTRODUODENOSCOPY N/A 12/01/2015   Procedure: ESOPHAGOGASTRODUODENOSCOPY (EGD) BALLOON DILATION OF DUODENAL STRICTURE;  Surgeon: Karie SodaSteven Gross, MD;  Location: [facility] ORS;  Service: General;  Laterality: N/A;  .  ESOPHAGOGASTRODUODENOSCOPY N/A 12/14/2015   Procedure: ESOPHAGOGASTRODUODENOSCOPY (EGD);  Surgeon: Vida RiggerMarc Magod, MD;  [facility];  Service: Endoscopy;  Laterality: N/A;  Patient has tracheostomy  . ESOPHAGOGASTRODUODENOSCOPY (EGD) WITH PROPOFOL N/A 07/01/2015   Procedure: ESOPHAGOGASTRODUODENOSCOPY (EGD) WITH PROPOFOL;  Surgeon: Willis ModenaWilliam Outlaw, MD;  Location: [facility];  Service: Endoscopy;  Laterality: N/A;  . ESOPHAGOGASTRODUODENOSCOPY (EGD) WITH PROPOFOL N/A 11/23/2015   Procedure: ESOPHAGOGASTRODUODENOSCOPY (EGD) WITH PROPOFOL;  Surgeon: Willis ModenaWilliam Outlaw, MD;  [facility];  Service: Endoscopy;  Laterality: N/A;  may need to intubate  . EYE SURGERY     growth on right eye removed   . IR GENERIC HISTORICAL  12/13/2015   IR REPLC DUODEN/JEJUNO TUBE PERCUT W/FLUORO 12/13/2015 Jolaine ClickArthur Hoss, MD [facility]-INTERV RAD  . LAPAROSCOPIC GASTROSTOMY N/A 12/01/2015   Procedure: LAPAROSCOPIC PLACEMENT OF FEEDING JEJUNOSTOMY AND GASTROSTOMY TUBE;  Surgeon: Karie SodaSteven Gross, MD;  Location: [facility];  Service: General;  Laterality: N/A;  . LAPAROSCOPY N/A 12/03/2015   Procedure: LAPAROSCOPY DIAGNOSTIC, OMENTOPEXY, JEJUNOSTOMY, WASH OUT;  Surgeon: Karie SodaSteven Gross, MD;  Location: [facility];  Service: General;  Laterality: N/A;  . STENT TO HEART     2 1997 AND 1 IN 1996  . TONSILLECTOMY    . XI ROBOTIC VAGOTOMY AND ANTRECTOMY N/A 09/25/2015   Procedure: XI ROBOTIC ANTERIOR AND POSTERIOR VAGOTOMY, BILROTH I  ANASTOMOSIS DOR FUNDIPLICATION, OMENTOPEXY  UPPER ENDOSCOPY;  Surgeon: Karie SodaSteven Gross, MD;  Location: [facility];  Service: General;  Laterality: N/A;       Home Medications    Prior to Admission medications   Medication Sig Start Date End Date Taking? Authorizing Provider  aluminum-magnesium hydroxide-simethicone (MAALOX) 200-200-20 MG/5ML SUSP Take 30 mLs by mouth every 6 (six) hours as needed.   Yes Historical Provider, MD  Amino Ac Elect-Calc in D10W (CLINIMIX E/DEXTROSE, 2.75/10,) 2.75 % SOLN Inject  2,160 mLs into the vein as needed.   Yes Historical Provider, MD  aspirin 81 MG chewable tablet Place 81 mg into feeding tube every evening.   Yes Historical Provider, MD  atorvastatin (LIPITOR) 40 MG tablet Place 40 mg into feeding tube at bedtime.    Yes Historical Provider, MD  bisacodyl (DULCOLAX) 10 MG suppository Place 10 mg rectally daily as needed for moderate constipation or severe constipation.   Yes Historical Provider, MD  bismuth subsalicylate (PEPTO BISMOL) 262 MG/15ML suspension Place 30 mLs into feeding tube every 8 (eight) hours as needed for indigestion or diarrhea or loose stools.   Yes Historical Provider, MD  calcium-vitamin D (OSCAL WITH D) 500-200 MG-UNIT tablet Place 1 tablet into feeding tube daily with breakfast.    Yes Historical Provider, MD  Cyanocobalamin (VITAMIN B-12) 1000 MCG SUBL Place 1 tablet under the tongue daily with breakfast.   Yes Historical Provider, MD  Ferrous Sulfate 220 (44 Fe) MG/5ML LIQD Place 7.5 mLs into feeding tube at bedtime.    Yes Historical Provider, MD  FLUoxetine (PROZAC) 20 MG/5ML solution Place 20 mg into feeding tube daily.   Yes Historical Provider, MD  haloperidol (HALDOL) 0.5 MG tablet Take 0.25-0.5 mg by mouth 3 (three) times daily. At 6 am and 4 PM and 9 PM   Yes Historical Provider, MD  haloperidol (HALDOL) 0.5 MG tablet Place  0.5 mg into feeding tube as needed for agitation. STOP DATE 01/24/2016 01/10/16 01/24/16 Yes Historical Provider, MD  loperamide (IMODIUM) 1 MG/5ML solution 10 mg. Take 10 mls via tube every 8 hours as needed for more than 2 bms every 8 hours.   Yes Historical Provider, MD  magic mouthwash SOLN Take 15 mLs by mouth 4 (four) times daily as needed for mouth pain (sore throat). 12/30/15  Yes Karie Soda, MD  metoCLOPramide (REGLAN) 5 MG/5ML solution Place 10 mg into feeding tube every 6 (six) hours as needed for nausea or vomiting.   Yes Historical Provider, MD  metoprolol tartrate (LOPRESSOR) 25 MG tablet Place 25  mg into feeding tube daily.   Yes Historical Provider, MD  nicotine (NICODERM CQ - DOSED IN MG/24 HR) 7 mg/24hr patch Place 7 mg onto the skin daily. Rotate site   Yes Historical Provider, MD  nitroGLYCERIN (NITROSTAT) 0.4 MG SL tablet Place 0.4 mg under the tongue every 5 (five) minutes as needed for chest pain.   Yes Historical Provider, MD  Nutritional Supplements (FEEDING SUPPLEMENT, OSMOLITE 1.2 CAL,) LIQD Place 237 mLs into feeding tube. Give 1 can via G-tube every day at  10 am and  6 pm.   Yes Historical Provider, MD  omeprazole (PRILOSEC) 40 MG capsule Take 40 mg by mouth 2 (two) times daily.   Yes Historical Provider, MD  Polyvinyl Alcohol-Povidone (REFRESH OP) Place 2 drops into both eyes as needed (For dry eyes.).    Yes Historical Provider, MD  potassium chloride (KCL) 2 mEq/mL SOLN oral liquid Take 30 mEq by mouth daily.   Yes Historical Provider, MD  RESTASIS 0.05 % ophthalmic emulsion Place 1 drop into both eyes 2 (two) times daily.  04/16/13  Yes Historical Provider, MD  traZODone (DESYREL) 50 MG tablet Place 100 mg into feeding tube at bedtime.    Yes Historical Provider, MD  Water For Irrigation, Sterile (STERILE WATER FOR IRRIGATION) Irrigate with 250 mLs as directed every 6 (six) hours.    Historical Provider, MD    Family History Family History  Problem Relation Age of Onset  . Heart attack Mother     Social History Social History  Substance Use Topics  . Smoking status: Former Smoker    Packs/day: 2.00    Years: 20.00    Types: Cigarettes    Quit date: 02/15/1995  . Smokeless tobacco: Never Used  . Alcohol use No     Allergies   Flomax [tamsulosin hcl]; Lisinopril; Lorazepam; and Uroxatral [alfuzosin hcl er]   Review of Systems Review of Systems  Gastrointestinal: Positive for abdominal pain, diarrhea, nausea and vomiting.  All other systems reviewed and are negative.    Physical Exam Updated Vital Signs BP (!) 141/42   Pulse 85   Temp 99.5 F (37.5  C) (Oral)   Resp 18   SpO2 93%   Physical Exam  Constitutional: He is oriented to person, place, and time. He appears well-developed and well-nourished.  PICC line in right upper extremity, no surrounding cellulitis or erythema  HENT:  Head: Normocephalic and atraumatic.  Trach in place  Eyes: Conjunctivae and EOM are normal. Pupils are equal, round, and reactive to light. Right eye exhibits no discharge. Left eye exhibits no discharge. No scleral icterus.  Neck: Normal range of motion. Neck supple. No JVD present.  Cardiovascular: Normal rate, regular rhythm and normal heart sounds.  Exam reveals no gallop and no friction rub.   No murmur heard. Pulmonary/Chest: Effort  normal and breath sounds normal. No respiratory distress. He has no wheezes. He has no rales. He exhibits no tenderness.  Abdominal: Soft. He exhibits no distension and no mass. There is tenderness. There is no rebound and no guarding.  G-tube present without evidence of infection  Moderate generalized abdominal tenderness   Musculoskeletal: Normal range of motion. He exhibits no edema or tenderness.  Neurological: He is alert and oriented to person, place, and time.  Skin: Skin is warm and dry.  Psychiatric: He has a normal mood and affect. His behavior is normal. Judgment and thought content normal.  Nursing note and vitals reviewed.    ED Treatments / Results  Labs (all labs ordered are listed, but only abnormal results are displayed) Labs Reviewed  CBC WITH DIFFERENTIAL/PLATELET - Abnormal; Notable for the following:       Result Value   WBC 12.7 (*)    RBC 2.76 (*)    Hemoglobin 8.1 (*)    HCT 25.0 (*)    RDW 17.9 (*)    Lymphs Abs 4.5 (*)    Monocytes Absolute 1.4 (*)    All other components within normal limits  COMPREHENSIVE METABOLIC PANEL - Abnormal; Notable for the following:    Sodium 133 (*)    Glucose, Bld 210 (*)    Calcium 7.6 (*)    Total Protein 5.6 (*)    Albumin 1.6 (*)    ALT 14 (*)     All other components within normal limits  LIPASE, BLOOD - Abnormal; Notable for the following:    Lipase 55 (*)    All other components within normal limits  URINALYSIS, ROUTINE W REFLEX MICROSCOPIC - Abnormal; Notable for the following:    APPearance HAZY (*)    All other components within normal limits  I-STAT CG4 LACTIC ACID, ED  I-STAT CG4 LACTIC ACID, ED    EKG  EKG Interpretation None       Radiology Dg Chest 2 View  Result Date: 01/20/2016 CLINICAL DATA:  Vomiting for 48 hours. EXAM: CHEST  2 VIEW COMPARISON:  12/11/2015 FINDINGS: Tracheostomy appliance is centered on the tracheal air column. Right upper extremity PICC line appears satisfactorily positioned. There are pleural effusions bilaterally. Probable consolidation in the left lower lobe. IMPRESSION: Bilateral pleural effusions. Left lower lobe consolidation could represent pneumonia or atelectasis. Electronically Signed   By: Ellery Plunkaniel R Mitchell M.D.   On: 01/20/2016 02:54   Ct Abdomen Pelvis W Contrast  Result Date: 01/20/2016 CLINICAL DATA:  Nausea vomiting for 2 days. Mid abdominal pain. Leukocytosis. EXAM: CT ABDOMEN AND PELVIS WITH CONTRAST TECHNIQUE: Multidetector CT imaging of the abdomen and pelvis was performed using the standard protocol following bolus administration of intravenous contrast. CONTRAST:  100 mL Isovue-300 intravenous COMPARISON:  12/10/2015 FINDINGS: Lower chest: Moderate left pleural effusion. Small right pleural effusion. Atelectatic appearing lung base opacities bilaterally Hepatobiliary: No focal liver abnormality is seen. Status post cholecystectomy. No biliary dilatation. Pancreas: Unremarkable. No pancreatic ductal dilatation or surrounding inflammatory changes. Spleen: Normal in size without focal abnormality. Adrenals/Urinary Tract: Adrenal glands are unremarkable. Kidneys are normal, without renal calculi, focal lesion, or hydronephrosis. Bladder is unremarkable. Stomach/Bowel: There is  abnormal dilatation of small bowel with abrupt transition to decompressed ileum. Transition point is in the abdominal right lower quadrant anteriorly. This likely represents a small bowel obstruction due to adhesions. No extraluminal air. Colon is remarkable only for uncomplicated diverticulosis. Stomach is mildly distended. Percutaneous gastrostomy appears satisfactorily positioned. Vascular/Lymphatic: The abdominal aorta  is normal in caliber with moderate atherosclerotic calcification. No adenopathy in the abdomen or pelvis. Reproductive: Unremarkable Other: Scant volume ascites.  No drainable peritoneal collections. Musculoskeletal: No significant skeletal lesion. IMPRESSION: 1. Small bowel obstruction involving ileum at the anterior aspect of the right lower quadrant, probably due to adhesion. 2. Moderate left pleural effusion.  Small right pleural effusion. 3. Colonic diverticulosis. Electronically Signed   By: Ellery Plunk M.D.   On: 01/20/2016 05:34    Procedures Procedures (including critical care time)  Medications Ordered in ED Medications  morphine 4 MG/ML injection 4 mg (not administered)  ondansetron (ZOFRAN) injection 4 mg (not administered)     Initial Impression / Assessment and Plan / ED Course  I have reviewed the triage vital signs and the nursing notes.  Pertinent labs & imaging results that were available during my care of the patient were reviewed by me and considered in my medical decision making (see chart for details).  Clinical Course     Patient with extensive recent past surgical history. Currently staying in a rehabilitation center. Brought to the emergency department after having several episodes of vomiting today. His also had diarrhea and abdominal pain.  Will check labs, imaging, and reassess.  CT scan remarkable for small bowel obstruction. Consulted with general surgery, who will admit patient. Asks for hospitalist consultation.  Patient being  treated for HCAP.  Patient seen by and discussed with Dr. Nicanor Alcon.  Final Clinical Impressions(s) / ED Diagnoses   Final diagnoses:  SBO (small bowel obstruction)  HCAP (healthcare-associated pneumonia)    New Prescriptions New Prescriptions   No medications on file     Roxy Horseman, Cordelia Poche 01/20/16 1610    April Palumbo, MD 01/20/16 [phone]

## 2016-01-20 NOTE — ED Notes (Signed)
Family is requesting to hold off with NGT placement, stating pt is finally resting and is comfortable for the  first time in a while.

## 2016-01-20 NOTE — ED Notes (Signed)
Called pharmacy to inquire about IV compatibility of ordered antibiotics and TPN pt is receiving, pharmacist recommends to wait for a PIV placement and run abx through PIV

## 2016-01-20 NOTE — Progress Notes (Addendum)
[name] - ADULT TOTAL PARENTERAL NUTRITION CONSULT NOTE   Pharmacy Consult for TPN Indication: PTA TPN continuation, intolerance to enteral feed    Insulin Requirements:   Current Nutrition: P[facility] 405 grams (1376 kcal)  of dextrose, 75 grams (300 kcal) of protein and 50 grams ( 480 kcal) of lipid for a total of 2156 kcal/day   IVF: D5 1/2NS with 20 meq at 100 ml/hr   Central access: PTA  TPN start date: PTA, restarted on admission on 12/6   ASSESSMENT                                                                                                          HPI:   Pharmacy is consulted to continue TPN for pt 75 yo pt who is intolerant of tube feedings. [facility] and was then discharged to SNF. Pt was intolerant to tube feedings due to diarrhea and NV and [facility]   Significant events:  12/6: TPN continued on admission. [facility]  Today:    Glucose -   Electrolytes - Na is low at 133, CorrCa is 9.5, others WNL  Renal - SCr WNL  LFTs - WNL  TGs - 177 ( 11/27)  Prealbumin - 15.3 ( 11/13)  NUTRITIONAL GOALS                                                                                             RD recs: pending   For initial continuation, will try to approximate [facility] recipe as much as possible with respect to national shortage of TPN.   Clinimix E  5/20 at a goal rate of 75 ml/hr + 20% fat emulsion at  10 ml/hr to provide: 90  g/day protein,  2064 Kcal/day.  PLAN                                                                                                                         At 1800 today:  Start Clinimix E  5/20 at 75 ml/hr.  20%  fat emulsion at 10 ml/hr.  TPN to contain standard multivitamins and trace elements.  Reduce IVF to 25 ml/hr.  Add sensitive SSI coverage q6h .   TPN lab panels on Mondays & Thursdays.  F/u  daily.   Adalberto ColeNikola Issiac Jamar, PharmD, BCPS Pager [phone] 01/20/2016 1:57 PM

## 2016-01-20 NOTE — ED Provider Notes (Signed)
Medical screening examination/treatment/procedure(s) were conducted as a shared visit with non-physician practitioner(s) and myself.  I personally evaluated the patient during the encounter.  NCAT PERRL Tracheostomy present without active drainage RRR Diminished BS B Diminished bowel sounds, non tender no rebound 2+ DTRs Skin warm and dry  Plan IV antibiotics for PNA, NGT for decompression and admission   Smokey Melott, MD 01/20/16 [phone]

## 2016-01-20 NOTE — H&P (Signed)
[facility] Surgery Admission Note  [name] [birth year]  [id number].    Requesting MD: Randal Buba, MD Chief Complaint/Reason for Consult: partial small bowel obstruction  HPI:  75 year-old male with a medical history of GERD, PUD, and CAD who presented to [facility] ED from [facility] via EMS with a cc of nausea, vomiting, and diarrhea. Patient is s/p Robotic anterior and posterior vagotomy, BI anastomosis, DOR fundoplication, omentopexy 09/2015 for partial GOO secondary to pyloric stricture, followed by placement of feeding jejunostomy and gastrostomy tube and EGD balloon dilation of duodenal stricture 12/01/15 for recurrent GOO secondary to edema at anastomosis and failure to thrive with malnutrition. On 12/03/15 the patient was taken to the OR for a diagnostic laparoscopy, omentopexy of jejunal disruption, and washout for jejunal disruption (pt pulled out jejunostomy tube) and gastric leak.   Majority of history reported by patients wife and daughter. Nausea, vomiting, diarrhea started two days ago and worsened yesterday. He denies abdominal pain. Last episode of emesis was about 10 hours ago.  Until 2 days ago the patient was receiving all of his feeds via G-tube. 2 days ago he began gradually increasing his oral intake after passing a swallow study at the nursing facility. Patient has had issues with diarrhea/electorlyte imbalance intermittently on tube feeds. He is now on TPN. Denies hematemesis and melena. Endorses hiccups but not increased belching.  He is now bedbound secondary to weakness. He does not ambulate. He is on haldol to manage agitation.   ED workup: WBC 12.7, Na 133, glucose 210, lipase 55, UA and lactate WNL CT abd/pelv w/ contrast: pSBO with transition point in anterior RLQ. Gastric tube in good position. Left pleural effusion and lung base opacities bilaterally.  ROS: Review of Systems  Constitutional: Positive for weight loss. Negative for  chills and fever.  Respiratory: Positive for cough and sputum production. Negative for shortness of breath.   Cardiovascular: Negative for chest pain and palpitations.  Gastrointestinal: Positive for diarrhea, nausea and vomiting. Negative for abdominal pain.  Genitourinary: Negative for dysuria.  Psychiatric/Behavioral: Positive for depression.    Family History  Problem Relation Age of Onset  . Heart attack Mother     Past Medical History:  Diagnosis Date  . Bilateral dry eyes   . BPH (benign prostatic hyperplasia)   . Central pterygium of right eye   . Coronary artery disease 1997, 2009   bare mental stent right coronart artery, occlusive followed by Dr. Pernell Dupre in [place]  . Degenerative disc disease    LOWER BACK  . Dysphonia   . ED (erectile dysfunction)   . Gastroesophageal reflux disease   . Hyperlipidemia   . Hyperopia of both eyes with astigmatism and presbyopia   . Hypertension   . Nuclear senile cataract    Dr. [facility] in [place]  . Peptic ulcer   . Pneumonia    HISTORY OF IN CHILDHOOD  . Posterior vitreous detachment 10/30/2012  . Salzmann's nodular dystrophy of left eye   . Urinary hesitancy   . Wears glasses     Past Surgical History:  Procedure Laterality Date  . CARDIAC CATHETERIZATION    . CHOLECYSTECTOMY    . ESOPHAGOGASTRODUODENOSCOPY N/A 12/01/2015   Procedure: ESOPHAGOGASTRODUODENOSCOPY (EGD) BALLOON DILATION OF DUODENAL STRICTURE;  Surgeon: Michael Boston, MD;  Location: [facility] ORS;  Service: General;  Laterality: N/A;  . ESOPHAGOGASTRODUODENOSCOPY N/A 12/14/2015   Procedure: ESOPHAGOGASTRODUODENOSCOPY (EGD);  Surgeon: Clarene Essex, MD;  Location: [facility] ENDOSCOPY;  Service: Endoscopy;  Laterality: N/A;  Patient has tracheostomy  . ESOPHAGOGASTRODUODENOSCOPY (EGD) WITH PROPOFOL N/A 07/01/2015   Procedure: ESOPHAGOGASTRODUODENOSCOPY (EGD) WITH PROPOFOL;  Surgeon: Arta Silence, MD;  Location: [facility] ENDOSCOPY;  Service: Endoscopy;   Laterality: N/A;  . ESOPHAGOGASTRODUODENOSCOPY (EGD) WITH PROPOFOL N/A 11/23/2015   Procedure: ESOPHAGOGASTRODUODENOSCOPY (EGD) WITH PROPOFOL;  Surgeon: Arta Silence, MD;  Location: [facility] ENDOSCOPY;  Service: Endoscopy;  Laterality: N/A;  may need to intubate  . EYE SURGERY     growth on right eye removed   . IR GENERIC HISTORICAL  12/13/2015   IR REPLC DUODEN/JEJUNO TUBE PERCUT W/FLUORO 12/13/2015 Marybelle Killings, MD [facility]-INTERV RAD  . LAPAROSCOPIC GASTROSTOMY N/A 12/01/2015   Procedure: LAPAROSCOPIC PLACEMENT OF FEEDING JEJUNOSTOMY AND GASTROSTOMY TUBE;  Surgeon: Michael Boston, MD;  Location: [facility];  Service: General;  Laterality: N/A;  . LAPAROSCOPY N/A 12/03/2015   Procedure: LAPAROSCOPY DIAGNOSTIC, OMENTOPEXY, JEJUNOSTOMY, [place] OUT;  Surgeon: Michael Boston, MD;  Location: [facility];  Service: General;  Laterality: N/A;  . STENT TO HEART     2 1997 AND 1 IN 1996  . TONSILLECTOMY    . XI ROBOTIC VAGOTOMY AND ANTRECTOMY N/A 09/25/2015   Procedure: XI ROBOTIC ANTERIOR AND POSTERIOR VAGOTOMY, BILROTH I  ANASTOMOSIS DOR FUNDIPLICATION, OMENTOPEXY  UPPER ENDOSCOPY;  Surgeon: Michael Boston, MD;  Location: [facility];  Service: General;  Laterality: N/A;    Social History:  reports that he quit smoking about 20 years ago. His smoking use included Cigarettes. He has a 40.00 pack-year smoking history. He has never used smokeless tobacco. He reports that he does not drink alcohol or use drugs.  Allergies:  Allergies  Allergen Reactions  . Flomax [Tamsulosin Hcl] Other (See Comments)    Leg weakness  . Lisinopril Cough  . Lorazepam     "i don't like it"  Prefers Xanax or valium  . Uroxatral [Alfuzosin Hcl Er] Other (See Comments)    Leg weakness     (Not in a hospital admission)  Blood pressure 124/66, pulse 86, temperature 98.6 F (37 C), temperature source Oral, resp. rate 16, SpO2 (!) 89 %. Physical Exam: General: pleasant, malnourished white male who is laying in bed in NAD HEENT: head is  normocephalic, atraumatic. Pupils equal. Ears and nose without any masses or lesions.  Mouth is pink and moist. Tracheostomy in place. Heart: regular, rate, and rhythm.  No obvious murmurs, gallops, or rubs noted. Lungs: CTABL, diminished breath sounds in bilateral lung bases. Respiratory effort nonlabored Abd: soft, NT/ND, minimal BS, no masses, hernias, or organomegaly; G tube in place c/d/i MS: all 4 extremities are symmetrical with no cyanosis, clubbing, or edema; muscle wasting present Skin: warm and dry with no masses, lesions, or rashes Psych: A&Ox3 with appropriate affect. Neuro: grossly intact, normal speech  Results for orders placed or performed during the hospital encounter of 01/19/16 (from the past 48 hour(s))  CBC with Differential/Platelet     Status: Abnormal   Collection Time: 01/20/16  1:58 AM  Result Value Ref Range   WBC 12.7 (H) 4.0 - 10.5 K/uL   RBC 2.76 (L) 4.22 - 5.81 MIL/uL   Hemoglobin 8.1 (L) 13.0 - 17.0 g/dL   HCT 25.0 (L) 39.0 - 52.0 %   MCV 90.6 78.0 - 100.0 fL   MCH 29.3 26.0 - 34.0 pg   MCHC 32.4 30.0 - 36.0 g/dL   RDW 17.9 (H) 11.5 - 15.5 %   Platelets 366 150 - 400 K/uL   Neutrophils Relative % 53 %  Neutro Abs 6.7 1.7 - 7.7 K/uL   Lymphocytes Relative 36 %   Lymphs Abs 4.5 (H) 0.7 - 4.0 K/uL   Monocytes Relative 11 %   Monocytes Absolute 1.4 (H) 0.1 - 1.0 K/uL   Eosinophils Relative 0 %   Eosinophils Absolute 0.1 0.0 - 0.7 K/uL   Basophils Relative 0 %   Basophils Absolute 0.0 0.0 - 0.1 K/uL  Comprehensive metabolic panel     Status: Abnormal   Collection Time: 01/20/16  1:58 AM  Result Value Ref Range   Sodium 133 (L) 135 - 145 mmol/L   Potassium 3.9 3.5 - 5.1 mmol/L   Chloride 102 101 - 111 mmol/L   CO2 26 22 - 32 mmol/L   Glucose, Bld 210 (H) 65 - 99 mg/dL   BUN 17 6 - 20 mg/dL   Creatinine, Ser 0.69 0.61 - 1.24 mg/dL   Calcium 7.6 (L) 8.9 - 10.3 mg/dL   Total Protein 5.6 (L) 6.5 - 8.1 g/dL   Albumin 1.6 (L) 3.5 - 5.0 g/dL   AST 21  15 - 41 U/L   ALT 14 (L) 17 - 63 U/L   Alkaline Phosphatase 111 38 - 126 U/L   Total Bilirubin 0.5 0.3 - 1.2 mg/dL   GFR calc non Af Amer >60 >60 mL/min   GFR calc Af Amer >60 >60 mL/min    Comment: (NOTE) The eGFR has been calculated using the CKD EPI equation. This calculation has not been validated in all clinical situations. eGFR's persistently <60 mL/min signify possible Chronic Kidney Disease.    Anion gap 5 5 - 15  Lipase, blood     Status: Abnormal   Collection Time: 01/20/16  1:58 AM  Result Value Ref Range   Lipase 55 (H) 11 - 51 U/L  I-Stat CG4 Lactic Acid, ED     Status: None   Collection Time: 01/20/16  2:26 AM  Result Value Ref Range   Lactic Acid, Venous 0.92 0.5 - 1.9 mmol/L  Urinalysis, Routine w reflex microscopic     Status: Abnormal   Collection Time: 01/20/16  5:08 AM  Result Value Ref Range   Color, Urine YELLOW YELLOW   APPearance HAZY (A) CLEAR   Specific Gravity, Urine 1.013 1.005 - 1.030   pH 5.0 5.0 - 8.0   Glucose, UA NEGATIVE NEGATIVE mg/dL   Hgb urine dipstick NEGATIVE NEGATIVE   Bilirubin Urine NEGATIVE NEGATIVE   Ketones, ur NEGATIVE NEGATIVE mg/dL   Protein, ur NEGATIVE NEGATIVE mg/dL   Nitrite NEGATIVE NEGATIVE   Leukocytes, UA NEGATIVE NEGATIVE   Dg Chest 2 View  Result Date: 01/20/2016 CLINICAL DATA:  Vomiting for 48 hours. EXAM: CHEST  2 VIEW COMPARISON:  12/11/2015 FINDINGS: Tracheostomy appliance is centered on the tracheal air column. Right upper extremity PICC line appears satisfactorily positioned. There are pleural effusions bilaterally. Probable consolidation in the left lower lobe. IMPRESSION: Bilateral pleural effusions. Left lower lobe consolidation could represent pneumonia or atelectasis. Electronically Signed   By: Andreas Newport M.D.   On: 01/20/2016 02:54   Ct Abdomen Pelvis W Contrast  Result Date: 01/20/2016 CLINICAL DATA:  Nausea vomiting for 2 days. Mid abdominal pain. Leukocytosis. EXAM: CT ABDOMEN AND PELVIS WITH  CONTRAST TECHNIQUE: Multidetector CT imaging of the abdomen and pelvis was performed using the standard protocol following bolus administration of intravenous contrast. CONTRAST:  100 mL Isovue-300 intravenous COMPARISON:  12/10/2015 FINDINGS: Lower chest: Moderate left pleural effusion. Small right pleural effusion. Atelectatic appearing lung base  opacities bilaterally Hepatobiliary: No focal liver abnormality is seen. Status post cholecystectomy. No biliary dilatation. Pancreas: Unremarkable. No pancreatic ductal dilatation or surrounding inflammatory changes. Spleen: Normal in size without focal abnormality. Adrenals/Urinary Tract: Adrenal glands are unremarkable. Kidneys are normal, without renal calculi, focal lesion, or hydronephrosis. Bladder is unremarkable. Stomach/Bowel: There is abnormal dilatation of small bowel with abrupt transition to decompressed ileum. Transition point is in the abdominal right lower quadrant anteriorly. This likely represents a small bowel obstruction due to adhesions. No extraluminal air. Colon is remarkable only for uncomplicated diverticulosis. Stomach is mildly distended. Percutaneous gastrostomy appears satisfactorily positioned. Vascular/Lymphatic: The abdominal aorta is normal in caliber with moderate atherosclerotic calcification. No adenopathy in the abdomen or pelvis. Reproductive: Unremarkable Other: Scant volume ascites.  No drainable peritoneal collections. Musculoskeletal: No significant skeletal lesion. IMPRESSION: 1. Small bowel obstruction involving ileum at the anterior aspect of the right lower quadrant, probably due to adhesion. 2. Moderate left pleural effusion.  Small right pleural effusion. 3. Colonic diverticulosis. Electronically Signed   By: Andreas Newport M.D.   On: 01/20/2016 05:34   Assessment/Plan Partial small bowel obstruction  Likely secondary to intraabdominal adhesions   NPO, IVF, NGT decompression  Abd film in AM  GERD - IV PPI  s/p  Robotic anterior and posterior vagotomy, BI anastomosis, DOR fundoplication, omentopexy 09/2015 S/p placement of feeding jejunostomy and gastrostomy tube and EGD balloon dilation of duodenal stricture 12/01/15 S/p diagnostic laparoscopy, omentopexy of jejunal disruption, and washout for jejunal disruption 12/03/15  PNA - suspect secondary to aspiration; IV abx per medicine, appreciate medical assistance Hx agitation - continue haldol  HLD - statin held  FEN: TPN per pharmacy, IVF, NPO ID:  VTE: heparin, SCD's  Dispo: NGT decompression, bowel rest, films in AM IV abx for PNA  Will consult CCM - per family patient had an appointment with Dr. Wyvonna Plum today for possible removal of tracheostomy tube.   Jill Alexanders, [facility] Surgery 01/20/2016, 7:31 AM Pager: [phone] Consults: [phone] Mon-Fri 7:00 am-4:30 pm Sat-Sun 7:00 am-11:30 am

## 2016-01-20 NOTE — Progress Notes (Signed)
Patient ID: [name], male   DOB: [birth year], 75 y.o.   MRN: [id number] [facility] Surgery Progress Note:   * No surgery found *  Subjective: Mental status is fairly clear.   Objective: Vital signs in last 24 hours: Temp:  [97.3 F (36.3 C)-99.5 F (37.5 C)] 97.3 F (36.3 C) (12/06 1316) Pulse Rate:  [38-91] 71 (12/06 1316) Resp:  [16-18] 16 (12/06 1316) BP: (107-148)/(42-69) 133/54 (12/06 1316) SpO2:  [86 %-94 %] 93 % (12/06 1316)  Intake/Output from previous day: No intake/output data recorded. Intake/Output this shift: Total I/O In: 250 [IV Piggyback:250] Out: 400 [Other:400]  Physical Exam: Work of breathing is effected by trach in place.    Lab Results:  Results for orders placed or performed during the hospital encounter of 01/19/16 (from the past 48 hour(s))  CBC with Differential/Platelet     Status: Abnormal   Collection Time: 01/20/16  1:58 AM  Result Value Ref Range   WBC 12.7 (H) 4.0 - 10.5 K/uL   RBC 2.76 (L) 4.22 - 5.81 MIL/uL   Hemoglobin 8.1 (L) 13.0 - 17.0 g/dL   HCT 25.0 (L) 39.0 - 52.0 %   MCV 90.6 78.0 - 100.0 fL   MCH 29.3 26.0 - 34.0 pg   MCHC 32.4 30.0 - 36.0 g/dL   RDW 17.9 (H) 11.5 - 15.5 %   Platelets 366 150 - 400 K/uL   Neutrophils Relative % 53 %   Neutro Abs 6.7 1.7 - 7.7 K/uL   Lymphocytes Relative 36 %   Lymphs Abs 4.5 (H) 0.7 - 4.0 K/uL   Monocytes Relative 11 %   Monocytes Absolute 1.4 (H) 0.1 - 1.0 K/uL   Eosinophils Relative 0 %   Eosinophils Absolute 0.1 0.0 - 0.7 K/uL   Basophils Relative 0 %   Basophils Absolute 0.0 0.0 - 0.1 K/uL  Comprehensive metabolic panel     Status: Abnormal   Collection Time: 01/20/16  1:58 AM  Result Value Ref Range   Sodium 133 (L) 135 - 145 mmol/L   Potassium 3.9 3.5 - 5.1 mmol/L   Chloride 102 101 - 111 mmol/L   CO2 26 22 - 32 mmol/L   Glucose, Bld 210 (H) 65 - 99 mg/dL   BUN 17 6 - 20 mg/dL   Creatinine, Ser 0.69 0.61 - 1.24 mg/dL   Calcium 7.6 (L) 8.9 - 10.3 mg/dL   Total  Protein 5.6 (L) 6.5 - 8.1 g/dL   Albumin 1.6 (L) 3.5 - 5.0 g/dL   AST 21 15 - 41 U/L   ALT 14 (L) 17 - 63 U/L   Alkaline Phosphatase 111 38 - 126 U/L   Total Bilirubin 0.5 0.3 - 1.2 mg/dL   GFR calc non Af Amer >60 >60 mL/min   GFR calc Af Amer >60 >60 mL/min    Comment: (NOTE) The eGFR has been calculated using the CKD EPI equation. This calculation has not been validated in all clinical situations. eGFR's persistently <60 mL/min signify possible Chronic Kidney Disease.    Anion gap 5 5 - 15  Lipase, blood     Status: Abnormal   Collection Time: 01/20/16  1:58 AM  Result Value Ref Range   Lipase 55 (H) 11 - 51 U/L  I-Stat CG4 Lactic Acid, ED     Status: None   Collection Time: 01/20/16  2:26 AM  Result Value Ref Range   Lactic Acid, Venous 0.92 0.5 - 1.9 mmol/L  Urinalysis, Routine w reflex  microscopic     Status: Abnormal   Collection Time: 01/20/16  5:08 AM  Result Value Ref Range   Color, Urine YELLOW YELLOW   APPearance HAZY (A) CLEAR   Specific Gravity, Urine 1.013 1.005 - 1.030   pH 5.0 5.0 - 8.0   Glucose, UA NEGATIVE NEGATIVE mg/dL   Hgb urine dipstick NEGATIVE NEGATIVE   Bilirubin Urine NEGATIVE NEGATIVE   Ketones, ur NEGATIVE NEGATIVE mg/dL   Protein, ur NEGATIVE NEGATIVE mg/dL   Nitrite NEGATIVE NEGATIVE   Leukocytes, UA NEGATIVE NEGATIVE    Radiology/Results: Dg Chest 2 View  Result Date: 01/20/2016 CLINICAL DATA:  Vomiting for 48 hours. EXAM: CHEST  2 VIEW COMPARISON:  12/11/2015 FINDINGS: Tracheostomy appliance is centered on the tracheal air column. Right upper extremity PICC line appears satisfactorily positioned. There are pleural effusions bilaterally. Probable consolidation in the left lower lobe. IMPRESSION: Bilateral pleural effusions. Left lower lobe consolidation could represent pneumonia or atelectasis. Electronically Signed   By: Andreas Newport M.D.   On: 01/20/2016 02:54   Ct Abdomen Pelvis W Contrast  Result Date: 01/20/2016 CLINICAL DATA:   Nausea vomiting for 2 days. Mid abdominal pain. Leukocytosis. EXAM: CT ABDOMEN AND PELVIS WITH CONTRAST TECHNIQUE: Multidetector CT imaging of the abdomen and pelvis was performed using the standard protocol following bolus administration of intravenous contrast. CONTRAST:  100 mL Isovue-300 intravenous COMPARISON:  12/10/2015 FINDINGS: Lower chest: Moderate left pleural effusion. Small right pleural effusion. Atelectatic appearing lung base opacities bilaterally Hepatobiliary: No focal liver abnormality is seen. Status post cholecystectomy. No biliary dilatation. Pancreas: Unremarkable. No pancreatic ductal dilatation or surrounding inflammatory changes. Spleen: Normal in size without focal abnormality. Adrenals/Urinary Tract: Adrenal glands are unremarkable. Kidneys are normal, without renal calculi, focal lesion, or hydronephrosis. Bladder is unremarkable. Stomach/Bowel: There is abnormal dilatation of small bowel with abrupt transition to decompressed ileum. Transition point is in the abdominal right lower quadrant anteriorly. This likely represents a small bowel obstruction due to adhesions. No extraluminal air. Colon is remarkable only for uncomplicated diverticulosis. Stomach is mildly distended. Percutaneous gastrostomy appears satisfactorily positioned. Vascular/Lymphatic: The abdominal aorta is normal in caliber with moderate atherosclerotic calcification. No adenopathy in the abdomen or pelvis. Reproductive: Unremarkable Other: Scant volume ascites.  No drainable peritoneal collections. Musculoskeletal: No significant skeletal lesion. IMPRESSION: 1. Small bowel obstruction involving ileum at the anterior aspect of the right lower quadrant, probably due to adhesion. 2. Moderate left pleural effusion.  Small right pleural effusion. 3. Colonic diverticulosis. Electronically Signed   By: Andreas Newport M.D.   On: 01/20/2016 05:34   Dg Abd Portable 1 View  Result Date: 01/20/2016 CLINICAL DATA:  Status  post NG tube placement. EXAM: PORTABLE ABDOMEN - 1 VIEW COMPARISON:  None. FINDINGS: NG tube is identified with the side-port in the upper stomach and tip in the midbody. IMPRESSION: NG tube in good position. Electronically Signed   By: Inge Rise M.D.   On: 01/20/2016 09:56    Anti-infectives: Anti-infectives    Start     Dose/Rate Route Frequency Ordered Stop   01/20/16 0400  vancomycin (VANCOCIN) IVPB 1000 mg/200 mL premix     1,000 mg 200 mL/hr over 60 Minutes Intravenous Every 12 hours 01/20/16 0323     01/20/16 0330  ceFEPIme (MAXIPIME) 1 g in dextrose 5 % 50 mL IVPB     1 g 100 mL/hr over 30 Minutes Intravenous  Once 01/20/16 0317 01/20/16 0735      Assessment/Plan: Problem List: Patient Active Problem List  Diagnosis Date Noted  . SBO (small bowel obstruction) 01/20/2016  . Pressure injury of skin 01/20/2016  . Tracheostomy care ([place])   . Pleural effusion   . Gastroesophageal reflux disease 01/19/2016  . Insomnia 01/19/2016  . Aspiration pneumonia ([place]) 01/17/2016  . On total parenteral nutrition (TPN) 01/17/2016  . Metabolic acidosis 75/79/7282  . Skin ulcer of abdominal wall, limited to breakdown of skin ([name]) 01/17/2016  . Dysphagia 01/17/2016  . Generalized weakness 01/17/2016  . 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 Vitamin B12 deficiency 01/02/2016  . BPH (benign prostatic hyperplasia)   . Posterior vitreous detachment   . Salzmann's nodular dystrophy of left eye   . Hyperopia of both eyes with astigmatism and presbyopia   . Central pterygium of right eye   . Pressure ulcer of buttock, right, unstageable ([place]) 12/30/2015  . Hypernatremia 12/28/2015  . Tracheostomy in place [facility]) 12/25/2015  . Tobacco abuse 12/09/2015  . Ischemic right index finger at tip 12/09/2015  . Gastritis 12/09/2015  . Gastric tube present (LUQ) 12/09/2015  . Acute respiratory failure with hypoxemia ([place])   . Protein-calorie malnutrition, severe 12/04/2015  .  Encephalopathy acute 12/03/2015  . Pyloric stricture 11/24/2015  . Anxiety state 09/28/2015  . Hyperlipidemia   . 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 Coronary atherosclerosis of native coronary artery 04/24/2013  . Essential hypertension, benign 04/24/2013  . Other and unspecified hyperlipidemia 04/24/2013  . Central pterygium 10/30/2012  . 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 Cataract, nuclear sclerotic senile 10/30/2012    Probable aspiration pneumonia and ileus-treated with NG like SBO.  Not opened up yet.   * No surgery found *    LOS: 0 days   Matt B. Hassell Done, MD, [facility] [phone] beeper [phone]  01/20/2016 5:01 PM

## 2016-01-20 NOTE — Clinical Social Work Note (Signed)
Clinical Social Work Assessment  Patient Details  Name: Danny GalRalph T Holford MRN: [id number] Date of Birth: 1940-12-24  Date of referral:  01/20/16               Reason for consult:  Other (Comment Required) (From [facility])                Permission sought to share information with:    Permission granted to share information::     Name::        Agency::     Relationship::     Contact Information:     Housing/Transportation Living arrangements for the past 2 months:  Skilled Nursing Facility Source of Information:  Spouse Patient Interpreter Needed:  None Criminal Activity/Legal Involvement Pertinent to Current Situation/Hospitalization:  No - Comment as needed Significant Relationships:  Spouse, Adult Children Lives with:  Spouse Do you feel safe going back to the place where you live?    Need for family participation in patient care:     Care giving concerns: Unknown at this time   Office managerocial Worker assessment / plan: CSW spoke with patient's family at bedside, spouse, Antony Contrasmma Jean Robello and daughter, Geoffry ParadiseHeather Blackman. NP was caring for the patient at the time of the visit. Spouse stated patient was brought in for nausea/vomiting and pneumonia. She stated patient is a resident of [facility] and he has been there for three weeks. She stated patient cannot walk at this time. She stated patient has a walker, potty chair, and shower chair, however, she states patient has not used this equipment for any period of time. She asked about a patient advocate and CSW spoke with ED Secretary who states she will speak with the family regarding an advocate. No other questions noted at this time.  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[employer]) PT Recommendations:  Not assessed at this time Information / Referral to community resources:     Patient/Family's Response to care: Unknown at this time  Patient/Family's Understanding of  and Emotional Response to Diagnosis, Current Treatment, and Prognosis: Unknown at this time  Emotional Assessment Appearance:  Appears stated age Attitude/Demeanor/Rapport:    Affect (typically observed):  Calm Orientation:  Oriented to Self, Oriented to Place, Oriented to  Time, Oriented to Situation Alcohol / Substance use:  Not Applicable Psych involvement (Current and /or in the community):     Discharge Needs  Concerns to be addressed:    Readmission within the last 30 days:    Current discharge risk:    Barriers to Discharge:      Claudean SeveranceLaVonia M Misa Fedorko, LCSW 01/20/2016, 1:45 PM

## 2016-01-20 NOTE — Progress Notes (Signed)
Pharmacy Antibiotic Note  [name] is a 75 y.o. male s/p recent abdominal surgery c/o abdominal pain and vomiting admitted on 01/19/2016 with pneumonia.  Pharmacy has been consulted for vancomycin dosing.  Plan: Cefepime 1gm x1 ED f/u  Vancomycin 1Gm IV q12h VT=15-20 mg/L     Temp (24hrs), Avg:98.5 F (36.9 C), Min:97 F (36.1 C), Max:99.5 F (37.5 C)   Recent Labs Lab 01/14/16 01/20/16 0158 01/20/16 0226  WBC 8.0 12.7*  --   CREATININE 0.8 0.69  --   LATICACIDVEN  --   --  0.92    Estimated Creatinine Clearance: 88 mL/min (by C-G formula based on SCr of 0.69 mg/dL).    Allergies  Allergen Reactions  . Flomax [Tamsulosin Hcl] Other (See Comments)    Leg weakness  . Lisinopril Cough  . Lorazepam     "i don't like it"  Prefers Xanax or valium  . Uroxatral [Alfuzosin Hcl Er] Other (See Comments)    Leg weakness    Antimicrobials this admission: 12/6 cefepime >>  12/6 vancomycin >>   Dose adjustments this admission:   Microbiology results:  BCx:   UCx:    Sputum:    MRSA PCR:   Thank you for allowing pharmacy to be a part of this patient's care.  Lorenza EvangelistGreen, [name] 01/20/2016 3:29 AM

## 2016-01-20 NOTE — Consult Note (Signed)
Name: [name] MRN: [id number] DOB: 03/15/1940    ADMISSION DATE:  01/19/2016 CONSULTATION DATE:  01/20/16  REFERRING MD :  Danny SpangleElizabeth Simaan, PA-C  / [facility]CS   CHIEF COMPLAINT:  Trach, Candidate for decannulation ?   HISTORY OF PRESENT ILLNESS:  75 y/o M, former smoker, with PMH of BPH, ED, DJD, GERD, CAD s/p stent, HTN, HLD, dementia and recent prolonged admission (10/10 - 11/15) for gastric outlet obstruction with pyloric channel ulcer.  He underwent a Billroth 1, vagotomy, fundoplication, in 09/2015 with initial improvement.  Unfortunately, he had recurrent symptoms in 10/2015 that became progressively worse in the setting of gastric dilation from recurrent stricture.  He further underwent a laparoscopic placement of jejunostomy tube, gastrostomy tube and EGD balloon dilation of duodenal stricture on 12/01/15.  He developed delirium / sundowning and pulled his PEG tube out which led to gastric leakage with peritonitis.  He had placement of drains 12/11/15 per IR for the development of 2 new LUQ abscesses / fluid collections.  Hospital course at that time was complicated by prolonged respiratory failure requiring tracheostomy, rectal bleeding, failure to thrive and embolic event to right fingers requiring heparin gtt.  He was downsized to a #6 cuff-less trach at time of discharge with plan of no decannulation until he became ambulatory.       [facility] from [facility] via EMS with reports of two day hx of nausea, vomiting and diarrhea. The patient denies abdominal pain. He reportedly is not yet ambulatory.  He has intermittently had electrolyte disturbances.  Has been on TPN. Nursing home notes reflect he had passed formed stool after tube feeding was changed to osmolyte. He recently was changed from tube feeing via G-Tube to oral intake after passing a swallow evaluation (blue dye test) at the facility. He had attempted oatmeal, subsequently had diarrhea.  Notes on  12/6 reflect the patient was having GERD and he was changed to clear liquids only.  The patient was planned to follow up with the trach clinic on 12/6 but was transported to the ER for nausea / vomiting / diarrhea.  CT of the abdomen/pelvis obtained 12/6 shows a small bowel obstruction involving ileum at the anterior aspect of the RLQ, probably due to adhesion, moderate left pleural effusion, small right, chronic diverticulosis.  Patient reports he has been getting out of bed to chair.  Denies abd pain, SOB.  Reports n/v for approx 48 hours.  Family notes when he vomited, secretions were around trach (unsure if came out trach).    PCCM called for evaluation of decannulation.   PAST MEDICAL HISTORY :   has a past medical history of Bilateral dry eyes; BPH (benign prostatic hyperplasia); Central pterygium of right eye; Coronary artery disease (1997, 2009); Degenerative disc disease; Dysphonia; ED (erectile dysfunction); Gastroesophageal reflux disease; Hyperlipidemia; Hyperopia of both eyes with astigmatism and presbyopia; Hypertension; Nuclear senile cataract; Peptic ulcer; Pneumonia; Posterior vitreous detachment (10/30/2012); Salzmann's nodular dystrophy of left eye; Urinary hesitancy; and Wears glasses.   has a past surgical history that includes STENT TO HEART; Cholecystectomy; Esophagogastroduodenoscopy (egd) with propofol (N/A, 07/01/2015); Tonsillectomy; Eye surgery; Cardiac catheterization; Xi robotic vagotomy and antrectomy (N/A, 09/25/2015); Esophagogastroduodenoscopy (egd) with propofol (N/A, 11/23/2015); Laparoscopic gastrostomy (N/A, 12/01/2015); Esophagogastroduodenoscopy (N/A, 12/01/2015); laparoscopy (N/A, 12/03/2015); ir generic historical (12/13/2015); and Esophagogastroduodenoscopy (N/A, 12/14/2015).  Prior to Admission medications   Medication Sig Start Date End Date Taking? Authorizing Provider  aluminum-magnesium hydroxide-simethicone (MAALOX) 200-200-20 MG/5ML  SUSP Take 30 mLs by mouth  every 6 (six) hours as needed.   Yes Historical Provider, MD  Amino Ac Elect-Calc in D10W (CLINIMIX E/DEXTROSE, 2.75/10,) 2.75 % SOLN Inject 2,160 mLs into the vein as needed.   Yes Historical Provider, MD  aspirin 81 MG chewable tablet Place 81 mg into feeding tube every evening.   Yes Historical Provider, MD  atorvastatin (LIPITOR) 40 MG tablet Place 40 mg into feeding tube at bedtime.    Yes Historical Provider, MD  bisacodyl (DULCOLAX) 10 MG suppository Place 10 mg rectally daily as needed for moderate constipation or severe constipation.   Yes Historical Provider, MD  bismuth subsalicylate (PEPTO BISMOL) 262 MG/15ML suspension Place 30 mLs into feeding tube every 8 (eight) hours as needed for indigestion or diarrhea or loose stools.   Yes Historical Provider, MD  calcium-vitamin D (OSCAL WITH D) 500-200 MG-UNIT tablet Place 1 tablet into feeding tube daily with breakfast.    Yes Historical Provider, MD  Cyanocobalamin (VITAMIN B-12) 1000 MCG SUBL Place 1 tablet under the tongue daily with breakfast.   Yes Historical Provider, MD  Ferrous Sulfate 220 (44 Fe) MG/5ML LIQD Place 7.5 mLs into feeding tube at bedtime.    Yes Historical Provider, MD  FLUoxetine (PROZAC) 20 MG/5ML solution Place 20 mg into feeding tube daily.   Yes Historical Provider, MD  haloperidol (HALDOL) 0.5 MG tablet Take 0.25-0.5 mg by mouth 3 (three) times daily. At 6 am and 4 PM and 9 PM   Yes Historical Provider, MD  haloperidol (HALDOL) 0.5 MG tablet Place 0.5 mg into feeding tube as needed for agitation. STOP DATE 01/24/2016 01/10/16 01/24/16 Yes Historical Provider, MD  loperamide (IMODIUM) 1 MG/5ML solution 10 mg. Take 10 mls via tube every 8 hours as needed for more than 2 bms every 8 hours.   Yes Historical Provider, MD  magic mouthwash SOLN Take 15 mLs by mouth 4 (four) times daily as needed for mouth pain (sore throat). 12/30/15  Yes Karie SodaSteven Gross, MD  metoCLOPramide (REGLAN) 5 MG/5ML solution Place 10 mg into feeding  tube every 6 (six) hours as needed for nausea or vomiting.   Yes Historical Provider, MD  metoprolol tartrate (LOPRESSOR) 25 MG tablet Place 25 mg into feeding tube daily.   Yes Historical Provider, MD  nicotine (NICODERM CQ - DOSED IN MG/24 HR) 7 mg/24hr patch Place 7 mg onto the skin daily. Rotate site   Yes Historical Provider, MD  nitroGLYCERIN (NITROSTAT) 0.4 MG SL tablet Place 0.4 mg under the tongue every 5 (five) minutes as needed for chest pain.   Yes Historical Provider, MD  Nutritional Supplements (FEEDING SUPPLEMENT, OSMOLITE 1.2 CAL,) LIQD Place 237 mLs into feeding tube. Give 1 can via G-tube every day at  10 am and  6 pm.   Yes Historical Provider, MD  omeprazole (PRILOSEC) 40 MG capsule Take 40 mg by mouth 2 (two) times daily.   Yes Historical Provider, MD  Polyvinyl Alcohol-Povidone (REFRESH OP) Place 2 drops into both eyes as needed (For dry eyes.).    Yes Historical Provider, MD  potassium chloride (KCL) 2 mEq/mL SOLN oral liquid Take 30 mEq by mouth daily.   Yes Historical Provider, MD  RESTASIS 0.05 % ophthalmic emulsion Place 1 drop into both eyes 2 (two) times daily.  04/16/13  Yes Historical Provider, MD  traZODone (DESYREL) 50 MG tablet Place 100 mg into feeding tube at bedtime.    Yes Historical Provider, MD  Water For Irrigation, Sterile (  STERILE WATER FOR IRRIGATION) Irrigate with 250 mLs as directed every 6 (six) hours.    Historical Provider, MD    Allergies  Allergen Reactions  . Flomax [Tamsulosin Hcl] Other (See Comments)    Leg weakness  . Lisinopril Cough  . Lorazepam     "i don't like it"  Prefers Xanax or valium  . Uroxatral [Alfuzosin Hcl Er] Other (See Comments)    Leg weakness    FAMILY HISTORY:  family history includes Heart attack in his mother.  SOCIAL HISTORY:  reports that he quit smoking about 20 years ago. His smoking use included Cigarettes. He has a 40.00 pack-year smoking history. He has never used smokeless tobacco. He reports that he does  not drink alcohol or use drugs.  REVIEW OF SYSTEMS:  POSITIVES IN BOLD Constitutional: Negative for fever, chills, weight loss, malaise/fatigue and diaphoresis.  Generalized weakness. HENT: Negative for hearing loss, ear pain, nosebleeds, congestion, sore throat, neck pain, tinnitus and ear discharge.   Eyes: Negative for blurred vision, double vision, photophobia, pain, discharge and redness.  Respiratory: Negative for cough, hemoptysis, sputum production, shortness of breath, wheezing and stridor.   Cardiovascular: Negative for chest pain, palpitations, orthopnea, claudication, leg swelling and PND.  Gastrointestinal: Negative for heartburn, nausea, vomiting, abdominal pain, diarrhea, constipation, blood in stool and melena.  Genitourinary: Negative for dysuria, urgency, frequency, hematuria and flank pain.  Musculoskeletal: Negative for myalgias, back pain, joint pain and falls.  Skin: Negative for itching and rash.  Neurological: Negative for dizziness, tingling, tremors, sensory change, speech change, focal weakness, seizures, loss of consciousness, weakness and headaches.  Endo/Heme/Allergies: Negative for environmental allergies and polydipsia. Does not bruise/bleed easily.  SUBJECTIVE:   VITAL SIGNS: Temp:  [97 F (36.1 C)-99.5 F (37.5 C)] 98.6 F (37 C) (12/06 0253) Pulse Rate:  [38-91] 82 (12/06 1026) Resp:  [16-18] 16 (12/06 1026) BP: (107-148)/(42-69) 123/57 (12/06 1026) SpO2:  [86 %-94 %] 92 % (12/06 1026) Weight:  [174 lb 8 oz (79.2 kg)] 174 lb 8 oz (79.2 kg) (12/05 1419)  PHYSICAL EXAMINATION: General:  Chronically ill adult male in NAD  Neuro:  Awake, alert, much stronger than at prior discharge but remains weak HEENT:  MM pink/moist, temporal wasting, #6 trach midline, mild erythema under phalange due to prior vomiting, dried green secretions at site (cleaned) Cardiovascular:  s1s2 rrr, no m/r/g  Lungs:  Even/non-labored, lungs bilaterally clear, diminished  posteriorly  Abdomen:  Soft, non-tender, G-tube in place  Musculoskeletal:  No acute deformities  Skin:  Warm/dry, trace pedal edema    Recent Labs Lab 01/14/16 01/20/16 0158  NA 139 133*  K 3.8 3.9  CL  --  102  CO2  --  26  BUN 23* 17  CREATININE 0.8 0.69  GLUCOSE  --  210*     Recent Labs Lab 01/14/16 01/20/16 0158  HGB 11.2* 8.1*  HCT 35* 25.0*  WBC 8.0 12.7*  PLT 290 366    Dg Chest 2 View  Result Date: 01/20/2016 CLINICAL DATA:  Vomiting for 48 hours. EXAM: CHEST  2 VIEW COMPARISON:  12/11/2015 FINDINGS: Tracheostomy appliance is centered on the tracheal air column. Right upper extremity PICC line appears satisfactorily positioned. There are pleural effusions bilaterally. Probable consolidation in the left lower lobe. IMPRESSION: Bilateral pleural effusions. Left lower lobe consolidation could represent pneumonia or atelectasis. Electronically Signed   By: Ellery Plunk M.D.   On: 01/20/2016 02:54   Ct Abdomen Pelvis W Contrast  Result Date: 01/20/2016  CLINICAL DATA:  Nausea vomiting for 2 days. Mid abdominal pain. Leukocytosis. EXAM: CT ABDOMEN AND PELVIS WITH CONTRAST TECHNIQUE: Multidetector CT imaging of the abdomen and pelvis was performed using the standard protocol following bolus administration of intravenous contrast. CONTRAST:  100 mL Isovue-300 intravenous COMPARISON:  12/10/2015 FINDINGS: Lower chest: Moderate left pleural effusion. Small right pleural effusion. Atelectatic appearing lung base opacities bilaterally Hepatobiliary: No focal liver abnormality is seen. Status post cholecystectomy. No biliary dilatation. Pancreas: Unremarkable. No pancreatic ductal dilatation or surrounding inflammatory changes. Spleen: Normal in size without focal abnormality. Adrenals/Urinary Tract: Adrenal glands are unremarkable. Kidneys are normal, without renal calculi, focal lesion, or hydronephrosis. Bladder is unremarkable. Stomach/Bowel: There is abnormal dilatation of  small bowel with abrupt transition to decompressed ileum. Transition point is in the abdominal right lower quadrant anteriorly. This likely represents a small bowel obstruction due to adhesions. No extraluminal air. Colon is remarkable only for uncomplicated diverticulosis. Stomach is mildly distended. Percutaneous gastrostomy appears satisfactorily positioned. Vascular/Lymphatic: The abdominal aorta is normal in caliber with moderate atherosclerotic calcification. No adenopathy in the abdomen or pelvis. Reproductive: Unremarkable Other: Scant volume ascites.  No drainable peritoneal collections. Musculoskeletal: No significant skeletal lesion. IMPRESSION: 1. Small bowel obstruction involving ileum at the anterior aspect of the right lower quadrant, probably due to adhesion. 2. Moderate left pleural effusion.  Small right pleural effusion. 3. Colonic diverticulosis. Electronically Signed   By: Ellery Plunk M.D.   On: 01/20/2016 05:34   Dg Abd Portable 1 View  Result Date: 01/20/2016 CLINICAL DATA:  Status post NG tube placement. EXAM: PORTABLE ABDOMEN - 1 VIEW COMPARISON:  None. FINDINGS: NG tube is identified with the side-port in the upper stomach and tip in the midbody. IMPRESSION: NG tube in good position. Electronically Signed   By: Drusilla Kanner M.D.   On: 01/20/2016 09:56      SIGNIFICANT EVENTS  12/06  Admit with N/V/D, was to be seen in trach clinic but missed due to acute illness.  PCCM consulted for possible decannulation vs downsize.   STUDIES:  12/06  CT Abd/Pelvis >> small bowel obstruction involving ileum at the anterior aspect of the RLQ, probably due to adhesion, moderate left pleural effusion, small right, chronic diverticulosis.     ASSESSMENT / PLAN:   1.  Nausea / Vomiting in the setting of partial SBO - Per CCS - bowel rest / decompression, NGT - will await CCS decision for further surgical needs before making decision regarding trach   2.   Chronic  Tracheostomy s/p Prolonged Critical Illness  - continue trach for now - will ask RT to change inner cannula and trach ties - he will need a trach change at some point during this admit.  Can consider downsize vs decannulation pending clinical course   3.  Bilateral Pleural Effusion L>R likely secondary to Malnutrition  - Consider bedside assessment with ultrasound for therapeutic relief.  However, if related to malnutrition, will likely return. - pt is not symptomatic from effusions - monitor clinically, CT findings likely compressive atelectasis and not active PNA  4.  Rule out Aspiration  - follow CXR - hold abx for now  5.  Dysphagia  - will need objective swallow study while inpatient to determine diet needs prior to discharge as he only had a "blue dye study" while at the SNF    Canary Brim, NP-C [facility] Pgr: [phone] or if no answer [phone] 01/20/2016, 10:59 AM  ATTENDING NOTE / ATTESTATION  NOTE :   I have discussed the case with the resident/APP  Canary Brim NP.   I agree with the resident/APP's  history, physical examination, assessment, and plans.    I have edited the above note and modified it according to our agreed history, physical examination, assessment and plan.   Briefly pt with recent prolonged admission (10/10 - 11/15) for gastric outlet obstruction with pyloric channel ulcer.  He underwent a Billroth 1, vagotomy, fundoplication, in 09/2015 with initial improvement.    He further underwent a laparoscopic placement of jejunostomy tube, gastrostomy tube and EGD balloon dilation of duodenal stricture on 12/01/15.  He developed delirium / sundowning and pulled his PEG tube out which led to gastric leakage with peritonitis.  He had placement of drains 12/11/15 per IR for the development of 2 new LUQ abscesses / fluid collections.  Hospital course at that time was complicated by prolonged respiratory failure requiring tracheostomy, rectal  bleeding, failure to thrive and embolic event to right fingers requiring heparin gtt.  He was downsized to a #6 cuff-less trach at time of discharge with plan of no decannulation until he became ambulatory.    He has been at a SNF x 3 weeks now, mostly bed bound.  (-) increase in trache secretions. Has a PMV. Pt with nausea and vomiting x 2days so TF have been held. He was started on TPN. There was note of recurrence of vomiting so he was sent to ED.  CT scan of abdomen shoed possible SBO vs ileus.  Pt was suppposed to f/u at trache clinic today for possible decannulation.   Pt seen examined.  Comfortably laying in the bed. Not in distress. Vital signs stables. O2 sats 94% on 2 L. Trach in place. Passy-Muir valve in place. Able to phonate without issues. Comfortable with a Passy-Muir valve. Clinically ill. (-) NVD.  Good ae. Crackles at bases. Good s1/s2. (-) m. (+) BS, soft, non tender. (-) masses. Gr 1 edema.  Rest of exam per Brandi.   Labs reviewed.  CXR done early morning with small effusion in the left lung. CT scan of the abdomen showed moderate size effusion on the left. Atelectasis at the left.  Assessment/Plan : 1. S/P Tracheostomy for respiratory failure.  Chronic Tracheostomy related to prolonged Illness/deconditioning -- I will hold off on decannulating this patient. Now that he is admitted again, it will be easier to keep tracheostomy just in case he develops resp failure again end ends back on the vent. May let him use the PMV during daytime unless he has more secretions.  At [facility]. He sleeps w/o PMV and O2 is added prn to keep O2 sats > 88%. Cont trache care. Can consider downsizing trache prior to d/c depending on his clinical course.   2.  Bilateral effusion, L > R, lilkely related to malnutrition/prolonged illness.  Doubt pt has PNA.  Pt is comfortable laying flat.  Will hold off on thoracentesis for now unless he gets more symptomatic.  I will cautiously hydrate him if needed as he will  most likely 3rd space.  May try diuresis and see if it helps with effusion.    Family :Family updated at length today.  Discussed plan with pt, wife, and daughter. bedside.    Pollie Meyer, MD 01/20/2016, 12:56 PM [facility] Pager [phone] After 3 pm or if no answer, call [phone]

## 2016-01-20 NOTE — Consult Note (Signed)
WOC Nurse wound consult note Reason for Consult:Patient known to me from previous admission, seen 4 weeks ago today. PeriTube insertion site is stable, wound management strategy in place at discharge is still effective, so I will reorder. Wound type: Moisture associated skin damage, contact dermatitis Pressure Ulcer POA: Stage 1 Pressure injury to sacrum.  Prophylactic dressing to be placed by bedside RN. Measurement: 12 cm x 12cm peritube area os partial thickness skin damage with full thickness skin loss in the immediate peritube insertion site area measuring 2cm x 3cm Wound bed:red, moist with tube feeding evident in wound base obscuring assessment of full depth. Drainage (amount, consistency, odor) serous plus tube feeding Periwound: partial thickness, granulating at areas of previous full thickness, erythematous Dressing procedure/placement/frequency:the previous wound care regimen is to place a strip of silver hydrofiber around tube, top with drain sponges (2).  The peritube denuded skin is covered with a zinc oxide based moisture barrier.  Patient is provided with a mattress replacement with low air loss feature and Prevalon Boots. WOC nursing team will not follow, but will remain available to this patient, the nursing and medical teams.  Please re-consult if needed. Thanks, Ladona MowLaurie Emil Klassen, MSN, RN, GNP, Hans EdenCWOCN, CWON-AP, FAAN  Pager# [phone]

## 2016-01-20 NOTE — Progress Notes (Signed)
CSW spoke with patient's family at bedside, spouse, Antony Contrasmma Jean Leiter and daughter, Geoffry ParadiseHeather Blackman. NP was caring for the patient at the time of the visit. Spouse stated patient was brought in for nausea/vomiting and pneumonia. She stated patient is a resident of [facility] and he has been there for three weeks. She stated patient cannot walk at this time. She stated patient has a walker, potty chair, and shower chair, however, she states patient has not used this equipment for any period of time. She asked about a patient advocate and CSW spoke with ED Secretary who states she will speak with the family regarding an advocate. No other questions noted at this time.   Posey ReaLaVonia Takyah Ciaramitaro, LCSWA Clinical Social Worker [phone] 12:33 PM

## 2016-01-20 NOTE — Progress Notes (Signed)
Unit Sec to order- #12 suction caths with suction set up, bedside pulse ox, trach tie, trachcare kit, extra #6 Sh cuffless trach. All other emergency equipment in room.

## 2016-01-20 NOTE — ED Notes (Signed)
Spoke with IV team about drawing blood samples and administering medications through PICC line.

## 2016-01-20 NOTE — ED Notes (Signed)
Provided patient a urinal for a specimen since he and family reports he knows when he urinates.

## 2016-01-21 ENCOUNTER — Inpatient Hospital Stay (HOSPITAL_COMMUNITY): Payer: Medicare Other

## 2016-01-21 DIAGNOSIS — E876 Hypokalemia: Secondary | ICD-10-CM

## 2016-01-21 LAB — CBC
HEMATOCRIT: 29.1 % — AB (ref 39.0–52.0)
HEMOGLOBIN: 9.3 g/dL — AB (ref 13.0–17.0)
MCH: 29.1 pg (ref 26.0–34.0)
MCHC: 32 g/dL (ref 30.0–36.0)
MCV: 90.9 fL (ref 78.0–100.0)
Platelets: 308 10*3/uL (ref 150–400)
RBC: 3.2 MIL/uL — AB (ref 4.22–5.81)
RDW: 17.5 % — AB (ref 11.5–15.5)
WBC: 6.9 10*3/uL (ref 4.0–10.5)

## 2016-01-21 LAB — COMPREHENSIVE METABOLIC PANEL
ALBUMIN: 1.5 g/dL — AB (ref 3.5–5.0)
ALK PHOS: 109 U/L (ref 38–126)
ALT: 12 U/L — AB (ref 17–63)
AST: 17 U/L (ref 15–41)
Anion gap: 3 — ABNORMAL LOW (ref 5–15)
BILIRUBIN TOTAL: 0.5 mg/dL (ref 0.3–1.2)
BUN: 17 mg/dL (ref 6–20)
CO2: 27 mmol/L (ref 22–32)
CREATININE: 0.65 mg/dL (ref 0.61–1.24)
Calcium: 7.5 mg/dL — ABNORMAL LOW (ref 8.9–10.3)
Chloride: 99 mmol/L — ABNORMAL LOW (ref 101–111)
GFR calc Af Amer: >60 mL/min (ref >60)
GFR calc non Af Amer: >60 mL/min (ref >60)
GLUCOSE: 182 mg/dL — AB (ref 65–99)
POTASSIUM: 3.6 mmol/L (ref 3.5–5.1)
Sodium: 129 mmol/L — ABNORMAL LOW (ref 135–145)
TOTAL PROTEIN: 5.3 g/dL — AB (ref 6.5–8.1)

## 2016-01-21 LAB — RETICULOCYTES
RBC.: 3.2 MIL/uL — ABNORMAL LOW (ref 4.22–5.81)
RETIC CT PCT: 3.9 % — AB (ref 0.4–3.1)
Retic Count, Absolute: 124.8 10*3/uL (ref 19.0–186.0)

## 2016-01-21 LAB — GLUCOSE, CAPILLARY
GLUCOSE-CAPILLARY: 140 mg/dL — AB (ref 65–99)
Glucose-Capillary: 120 mg/dL — ABNORMAL HIGH (ref 65–99)
Glucose-Capillary: 132 mg/dL — ABNORMAL HIGH (ref 65–99)
Glucose-Capillary: 142 mg/dL — ABNORMAL HIGH (ref 65–99)

## 2016-01-21 LAB — IRON AND TIBC
Iron: 17 ug/dL — ABNORMAL LOW (ref 45–182)
SATURATION RATIOS: 13 % — AB (ref 17.9–39.5)
TIBC: 130 ug/dL — AB (ref 250–450)
UIBC: 113 ug/dL

## 2016-01-21 LAB — DIFFERENTIAL
Basophils Absolute: 0 10*3/uL (ref 0.0–0.1)
Basophils Relative: 1 %
Eosinophils Absolute: 0.1 10*3/uL (ref 0.0–0.7)
Eosinophils Relative: 1 %
LYMPHS PCT: 41 %
Lymphs Abs: 2.8 10*3/uL (ref 0.7–4.0)
MONO ABS: 0.8 10*3/uL (ref 0.1–1.0)
Monocytes Relative: 12 %
NEUTROS ABS: 3.2 10*3/uL (ref 1.7–7.7)
Neutrophils Relative %: 45 %

## 2016-01-21 LAB — MAGNESIUM: Magnesium: 1.4 mg/dL — ABNORMAL LOW (ref 1.7–2.4)

## 2016-01-21 LAB — TRIGLYCERIDES: TRIGLYCERIDES: 93 mg/dL (ref <150)

## 2016-01-21 LAB — PHOSPHORUS: Phosphorus: 3.8 mg/dL (ref 2.5–4.6)

## 2016-01-21 LAB — FERRITIN: Ferritin: 194 ng/mL (ref 24–336)

## 2016-01-21 LAB — PREALBUMIN: Prealbumin: 10.8 mg/dL — ABNORMAL LOW (ref 18–38)

## 2016-01-21 LAB — VITAMIN B12: VITAMIN B 12: 1224 pg/mL — AB (ref 180–914)

## 2016-01-21 LAB — INFLUENZA PANEL BY PCR (TYPE A & B)
INFLBPCR: NEGATIVE
Influenza A By PCR: NEGATIVE

## 2016-01-21 LAB — FOLATE: Folate: 12 ng/mL (ref >5.9)

## 2016-01-21 MED ORDER — FAT EMULSION 20 % IV EMUL: 240.0000 mL | INTRAVENOUS | Status: DC

## 2016-01-21 MED ORDER — CLINIMIX E/DEXTROSE (5/15) 5 % IV SOLN: INTRAVENOUS | Status: DC

## 2016-01-21 MED ORDER — TRACE MINERALS CR-CU-MN-SE-ZN 10-1000-500-60 MCG/ML IV SOLN
INTRAVENOUS | Status: AC
  Administered 2016-01-21: 17:00:00 via INTRAVENOUS
  Filled 2016-01-21: qty 1992

## 2016-01-21 MED ORDER — MAGNESIUM SULFATE 4 GM/100ML IV SOLN
4.0000 g | Freq: Once | INTRAVENOUS | Status: AC
  Administered 2016-01-21: 4 g via INTRAVENOUS
  Filled 2016-01-21: qty 100

## 2016-01-21 MED ORDER — FAT EMULSION 20 % IV EMUL
240.0000 mL | INTRAVENOUS | Status: AC
  Administered 2016-01-21: 240 mL via INTRAVENOUS
  Filled 2016-01-21: qty 250

## 2016-01-21 MED ORDER — FERUMOXYTOL INJECTION 510 MG/17 ML
510.0000 mg | Freq: Once | INTRAVENOUS | Status: AC
  Administered 2016-01-21: 510 mg via INTRAVENOUS
  Filled 2016-01-21: qty 17

## 2016-01-21 NOTE — Progress Notes (Addendum)
[facility]  Gen surgery managing SBO and are attendings.  We were consulted to help manage pneumonia.  Patient examined, chart reviewed and case dicussed with nursing.  No symptoms of pneumonia- has a cough but productive of clear, tan sputum- no fever or leukocytosis.  Imaging reveals unilateral pl effusion and b/l atelectasis (doubtful pneumonia)  PCCM has agreed in their notes that this is likely not a pneumonia. They are questioning whether the effusion needs to be drained esp as he is asymptomatic from it.  Cefepime was a one time dose yesterday- I have /c'd the Vanc order today.   [facility] will sign off. Please re-consult if any other medical issues arise.   Calvert CantorSaima Kristianna Saperstein, MD

## 2016-01-21 NOTE — NC FL2 (Signed)
[place] MEDICAID FL2 LEVEL OF CARE SCREENING TOOL     IDENTIFICATION  Patient Name: [name] Birthdate: 09-15-1940 Sex: male Admission Date (Current Location): 01/19/2016  [facility] and [place] Number:  Producer, television/film/video and Address:  [facility] [place], [address], [place], [place] [address]      Provider Number: [id number]  Attending Physician Name and Address:  Md Montez Morita, MD  Relative Name and Phone Number:       Current Level of Care: Hospital Recommended Level of Care: Skilled Nursing Facility Prior Approval Number:    Date Approved/Denied:   PASRR Number:  [id number])  Discharge Plan: SNF    Current Diagnoses: Patient Active Problem List   Diagnosis Date Noted  . Hypomagnesemia 01/21/2016  . Hypokalemia 01/21/2016  . SBO (small bowel obstruction) 01/20/2016  . Pressure injury of skin 01/20/2016  . Tracheostomy care (HCC)   . Pleural effusion   . Gastroesophageal reflux disease 01/19/2016  . Insomnia 01/19/2016  . Aspiration pneumonia (HCC) 01/17/2016  . On total parenteral nutrition (TPN) 01/17/2016  . Metabolic acidosis 01/17/2016  . Skin ulcer of abdominal wall, limited to breakdown of skin (HCC) 01/17/2016  . Dysphagia 01/17/2016  . Generalized weakness 01/17/2016  . 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 Vitamin B12 deficiency 01/02/2016  . BPH (benign prostatic hyperplasia)   . Posterior vitreous detachment   . Salzmann's nodular dystrophy of left eye   . Hyperopia of both eyes with astigmatism and presbyopia   . Central pterygium of right eye   . Pressure ulcer of buttock, right, unstageable (HCC) 12/30/2015  . Hypernatremia 12/28/2015  . Tracheostomy in place [facility]) 12/25/2015  . Tobacco abuse 12/09/2015  . Ischemic right index finger at tip 12/09/2015  . Gastritis 12/09/2015  . Gastric tube present (LUQ) 12/09/2015  . Acute respiratory failure with hypoxemia (HCC)   . Protein-calorie malnutrition, severe  12/04/2015  . Encephalopathy acute 12/03/2015  . Pyloric stricture 11/24/2015  . Anxiety state 09/28/2015  . Hyperlipidemia   . 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 Coronary atherosclerosis of native coronary artery 04/24/2013  . Essential hypertension, benign 04/24/2013  . Other and unspecified hyperlipidemia 04/24/2013  . Central pterygium 10/30/2012  . 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 Cataract, nuclear sclerotic senile 10/30/2012    Orientation RESPIRATION BLADDER Height & Weight     Self, Time, Situation, Place  Tracheostomy (at 28%) Incontinent, External catheter Weight: 175 lb 9.6 oz (79.7 kg) Height:  6\' 2"  (188 cm)  BEHAVIORAL SYMPTOMS/MOOD NEUROLOGICAL BOWEL NUTRITION STATUS   (none )  (none ) Incontinent NG/panda (Currently has NG tube )  AMBULATORY STATUS COMMUNICATION OF NEEDS Skin   Total Care Verbally PU Stage and Appropriate Care     PU Stage 3 Dressing:  (PRN) (lower sacrum)                 Personal Care Assistance Level of Assistance  Total care       Total Care Assistance: Maximum assistance   Functional Limitations Info  Speech, Hearing, Sight Sight Info: Impaired Hearing Info: Adequate Speech Info: Adequate    SPECIAL CARE FACTORS FREQUENCY  PT (By licensed PT), OT (By licensed OT)     PT Frequency: 3 OT Frequency: 3            Contractures      Additional Factors Info  Code Status, Allergies Code Status Info:  FULL CODE  Allergies Info: Flomax Tamsulosin Hcl, Lisinopril, Lorazepam, Uroxatral Alfuzosin Hcl Er           Current Medications (01/21/2016):  This is the current hospital active medication list Current Facility-Administered Medications  Medication Dose Route Frequency Provider Last Rate Last Dose  . alum & mag hydroxide-simeth (MAALOX/MYLANTA) 200-200-20 MG/5ML suspension 30 mL  30 mL Oral Q6H PRN Karie SodaSteven Gross, MD      . bisacodyl  (DULCOLAX) suppository 10 mg  10 mg Rectal Daily PRN Francine GravenElizabeth S Simaan, PA-C      . bisacodyl (DULCOLAX) suppository 10 mg  10 mg Rectal Daily Karie SodaSteven Gross, MD   10 mg at 01/21/16 0950  . diphenhydrAMINE (BENADRYL) injection 12.5-25 mg  12.5-25 mg Intravenous Q6H PRN Karie SodaSteven Gross, MD      . TPN (CLINIMIX-E) Adult   Intravenous Continuous TPN Adalberto Coleikola Glogovac, RPH 75 mL/hr at 01/20/16 1627     And  . fat emulsion 20 % infusion 240 mL  240 mL Intravenous Continuous TPN Adalberto ColeNikola Glogovac, RPH 10 mL/hr at 01/20/16 1627 240 mL at 01/20/16 1627  . TPN (CLINIMIX-E) Adult   Intravenous Continuous TPN Luretha MurphyMatthew Martin, MD       And  . fat emulsion 20 % infusion 240 mL  240 mL Intravenous Continuous TPN Luretha MurphyMatthew Martin, MD      . haloperidol lactate (HALDOL) injection 2-5 mg  2-5 mg Intravenous Q6H PRN Karie SodaSteven Gross, MD      . heparin injection 5,000 Units  5,000 Units Subcutaneous [address] Elizabeth S Simaan, PA-C   5,000 Units at 01/21/16 1333  . insulin aspart (novoLOG) injection 0-9 Units  0-9 Units Subcutaneous Q6H Nikola Glogovac, RPH   1 Units at 01/21/16 1332  . lactated ringers bolus 1,000 mL  1,000 mL Intravenous Q8H PRN Karie SodaSteven Gross, MD      . lip balm (CARMEX) ointment 1 application  1 application Topical BID Karie SodaSteven Gross, MD   1 application at 01/21/16 0950  . liver oil-zinc oxide (DESITIN) 40 % ointment   Topical BID Ripudeep K Rai, MD      . magic mouthwash  15 mL Oral QID PRN Francine GravenElizabeth S Simaan, PA-C      . magnesium sulfate IVPB 4 g 100 mL  4 g Intravenous Once Karie SodaSteven Gross, MD   4 g at 01/21/16 1302  . menthol-cetylpyridinium (CEPACOL) lozenge 3 mg  1 lozenge Oral PRN Karie SodaSteven Gross, MD      . metoCLOPramide (REGLAN) injection 5-10 mg  5-10 mg Intravenous Q6H PRN Karie SodaSteven Gross, MD      . metoprolol (LOPRESSOR) injection 5 mg  5 mg Intravenous Q6H PRN Karie SodaSteven Gross, MD      . morphine 2 MG/ML injection 1 mg  1 mg Intravenous Q2H PRN Francine GravenElizabeth S Simaan, PA-C      . ondansetron [facility](ZOFRAN) injection 4 mg  4  mg Intravenous Q6H PRN Francine GravenElizabeth S Simaan, PA-C      . pantoprazole (PROTONIX) injection 40 mg  40 mg Intravenous Q12H Francine GravenElizabeth S Simaan, PA-C   40 mg at 01/21/16 0950  . phenol (CHLORASEPTIC) mouth spray 2 spray  2 spray Mouth/Throat PRN Karie SodaSteven Gross, MD      . prochlorperazine (COMPAZINE) injection 5-10 mg  5-10 mg Intravenous Q4H PRN Karie SodaSteven Gross, MD      . sodium chloride flush (NS) 0.9 % injection 10-40 mL  10-40 mL Intracatheter PRN April Palumbo, MD   10 mL at 01/21/16 0443     Discharge  Medications: Please see discharge summary for a list of discharge medications.  Relevant Imaging Results:  Relevant Lab Results:   Additional Information SSN: [id number]/wound and ostomy care   Derenda Fennelixon, Antonisha Waskey A

## 2016-01-21 NOTE — Progress Notes (Signed)
[facility] Progress Note     Subjective: Sitting up in chair - NGT got pulled out in transition from bed to chair. Patient denies abdominal pain at rest. Denies nausea or vomiting. Denies flatus or BM. Denies chills, SOB, chest pain.   When questioned further about being non-ambulatory, whether it was because weakness prevented him from physically walking or if he felt unmotivated/like giving up, he responded with "You know, i'm not sure why".    Objective: Vital signs in last 24 hours: Temp:  [97.3 F (36.3 C)-98.7 F (37.1 C)] 98.4 F (36.9 C) (12/07 0518) Pulse Rate:  [71-82] 78 (12/07 0940) Resp:  [16] 16 (12/07 0940) BP: (120-155)/(44-65) 155/65 (12/07 0518) SpO2:  [92 %-100 %] 97 % (12/07 0940) FiO2 (%):  [28 %] 28 % (12/07 0940)   Intake/Output from previous day: 12/06 0701 - 12/07 0700 In: 1911.8 [I.V.:1461.8; IV Piggyback:450] Out: 1250 [Urine:850] Intake/Output this shift: Total I/O In: -  Out: 400 [Urine:400]  PE: Gen:  Alert, NAD, pleasant and cooperative - sitting up in chair  HEENT: trach in place on 2L O2 Pulm:  CTA, no W/R/R Abd: Soft, mild tenderness to palpation of right mid-lower abdomen without rebound tenderness or peritonitis, ND, +BS, G-tube site covered, clean, without leakage.  Ext:  No erythema, edema, or tenderness  Lab Results:   Recent Labs  01/20/16 0158 01/21/16 0437  WBC 12.7* 6.9  HGB 8.1* 9.3*  HCT 25.0* 29.1*  PLT 366 308   BMET  Recent Labs  01/20/16 0158 01/21/16 0437  NA 133* 129*  K 3.9 3.6  CL 102 99*  CO2 26 27  GLUCOSE 210* 182*  BUN 17 17  CREATININE 0.69 0.65  CALCIUM 7.6* 7.5*   PT/INR No results for input(s): LABPROT, INR in the last 72 hours. CMP     Component Value Date/Time   NA 129 (L) 01/21/2016 0437   NA 139 01/14/2016   K 3.6 01/21/2016 0437   CL 99 (L) 01/21/2016 0437   CO2 27 01/21/2016 0437   GLUCOSE 182 (H) 01/21/2016 0437   BUN 17 01/21/2016 0437   BUN 23 (A) 01/14/2016    CREATININE 0.65 01/21/2016 0437   CALCIUM 7.5 (L) 01/21/2016 0437   PROT 5.3 (L) 01/21/2016 0437   ALBUMIN 1.5 (L) 01/21/2016 0437   AST 17 01/21/2016 0437   ALT 12 (L) 01/21/2016 0437   ALKPHOS 109 01/21/2016 0437   BILITOT 0.5 01/21/2016 0437   GFRNONAA >60 01/21/2016 0437   GFRAA >60 01/21/2016 0437   Lipase     Component Value Date/Time   LIPASE 55 (H) 01/20/2016 0158   Studies/Results: Dg Chest 2 View  Result Date: 01/20/2016 CLINICAL DATA:  Vomiting for 48 hours. EXAM: CHEST  2 VIEW COMPARISON:  12/11/2015 FINDINGS: Tracheostomy appliance is centered on the tracheal air column. Right upper extremity PICC line appears satisfactorily positioned. There are pleural effusions bilaterally. Probable consolidation in the left lower lobe. IMPRESSION: Bilateral pleural effusions. Left lower lobe consolidation could represent pneumonia or atelectasis. Electronically Signed   By: Ellery Plunk M.D.   On: 01/20/2016 02:54   Dg Abd 1 View  Result Date: 01/21/2016 CLINICAL DATA:  Abdominal distention. EXAM: ABDOMEN - 1 VIEW COMPARISON:  01/20/2016 .  CT 01/20/2016 . FINDINGS: Surgical clips and sutures right upper quadrant. NG tube and gastrostomy tube noted in stable position. Soft tissue structures are unremarkable. Interim partial resolution small bowel distention. No free air. IMPRESSION: 1. Surgical clips sutures in  the right upper quadrant. NG tube and gastrostomy tube in stable position. 2.  Interim partial resolution of small bowel distention. Electronically Signed   By: Maisie Fushomas  Register   On: 01/21/2016 08:25   Ct Abdomen Pelvis W Contrast  Result Date: 01/20/2016 CLINICAL DATA:  Nausea vomiting for 2 days. Mid abdominal pain. Leukocytosis. EXAM: CT ABDOMEN AND PELVIS WITH CONTRAST TECHNIQUE: Multidetector CT imaging of the abdomen and pelvis was performed using the standard protocol following bolus administration of intravenous contrast. CONTRAST:  100 mL Isovue-300 intravenous  COMPARISON:  12/10/2015 FINDINGS: Lower chest: Moderate left pleural effusion. Small right pleural effusion. Atelectatic appearing lung base opacities bilaterally Hepatobiliary: No focal liver abnormality is seen. Status post cholecystectomy. No biliary dilatation. Pancreas: Unremarkable. No pancreatic ductal dilatation or surrounding inflammatory changes. Spleen: Normal in size without focal abnormality. Adrenals/Urinary Tract: Adrenal glands are unremarkable. Kidneys are normal, without renal calculi, focal lesion, or hydronephrosis. Bladder is unremarkable. Stomach/Bowel: There is abnormal dilatation of small bowel with abrupt transition to decompressed ileum. Transition point is in the abdominal right lower quadrant anteriorly. This likely represents a small bowel obstruction due to adhesions. No extraluminal air. Colon is remarkable only for uncomplicated diverticulosis. Stomach is mildly distended. Percutaneous gastrostomy appears satisfactorily positioned. Vascular/Lymphatic: The abdominal aorta is normal in caliber with moderate atherosclerotic calcification. No adenopathy in the abdomen or pelvis. Reproductive: Unremarkable Other: Scant volume ascites.  No drainable peritoneal collections. Musculoskeletal: No significant skeletal lesion. IMPRESSION: 1. Small bowel obstruction involving ileum at the anterior aspect of the right lower quadrant, probably due to adhesion. 2. Moderate left pleural effusion.  Small right pleural effusion. 3. Colonic diverticulosis. Electronically Signed   By: Ellery Plunkaniel R Mitchell M.D.   On: 01/20/2016 05:34   Dg Abd Portable 1 View  Result Date: 01/20/2016 CLINICAL DATA:  Status post NG tube placement. EXAM: PORTABLE ABDOMEN - 1 VIEW COMPARISON:  None. FINDINGS: NG tube is identified with the side-port in the upper stomach and tip in the midbody. IMPRESSION: NG tube in good position. Electronically Signed   By: Drusilla Kannerhomas  Dalessio M.D.   On: 01/20/2016 09:56     Anti-infectives: Anti-infectives    Start     Dose/Rate Route Frequency Ordered Stop   01/20/16 0400  vancomycin (VANCOCIN) IVPB 1000 mg/200 mL premix  Status:  Discontinued     1,000 mg 200 mL/hr over 60 Minutes Intravenous Every 12 hours 01/20/16 0323 01/21/16 0900   01/20/16 0330  ceFEPIme (MAXIPIME) 1 g in dextrose 5 % 50 mL IVPB     1 g 100 mL/hr over 30 Minutes Intravenous  Once 01/20/16 0317 01/20/16 0735     Assessment/Plan Partial small bowel obstruction             Likely secondary to intraabdominal adhesions              continue NPO, IVF, TPN  NGT - 1650 cc/24h; pulled out this AM              Abd film this AM - partial resolution SBO  No return of bowel function  GERD - IV PPI  s/p Robotic anterior and posterior vagotomy, BI anastomosis, DOR fundoplication, omentopexy 09/2015 S/p placement of feeding jejunostomy and gastrostomy tube and EGD balloon dilation of duodenal stricture 12/01/15 S/p diagnostic laparoscopy, omentopexy of jejunal disruption, and washout for jejunal disruption 12/03/15  Chronic tracheostomy s/p prolonged critical illness - greatly appreciate CCM consult, continue trach and local care for now, possible downsize vs decannulation pending clinical  course; will require swallow study this admission.   Bilateral pleural effusion - R>L suspect secondary to malnutrition; asymptomatic, O2sats stable; possible diuresis? Possible aspiration - hold antibiotics for now, CXR  Stage 1 sacral pressure ulcer - appreciate WOC assistance; air bed and prophylactic dressing; local wound care to skin breakdown around G-tube site. Hx agitation - continue haldol  HLD - statin held Hyperglycemia - SSI Malnutrition - pre-albumin 10.8, albumin 1.8, B12 1,224  Normocytic anemia of chronic disease - hgb stable 9.3; iron 17, TIBC 130, Saturation 13% ; one dose feraheme ordered.  FEN: TPN per pharmacy, IVF, NPO ID: Vancomycin 12/6-12/7 for possible aspiration PNA; held   VTE: heparin, SCD's  Dispo: leave NGT out for now, page general surgery if nausea/vomiting recurs Replace Mg, one dose feraheme  Continue NPO status and repeat DG Abd in AM  DG chest to re-evaluate effusions  PT/OT consult to help patient mobilize  Local wound care to sacrum and G-tube site    LOS: 1 day    Adam PhenixElizabeth S Charnell Peplinski , [facility] 01/21/2016, 10:03 AM Pager: [phone] Consults: [phone] Mon-Fri 7:00 am-4:30 pm Sat-Sun 7:00 am-11:30 am

## 2016-01-21 NOTE — Progress Notes (Signed)
RT replaced trach tie - uneventful (trach is secure at this time).

## 2016-01-21 NOTE — Progress Notes (Signed)
[facility]  Mr. [name] is an active pt with [facility].  [facility] prior to admission. [facility] is providing patient's TPN at the SNF.  [facility] will follow pt while here to support transition back [facility] upon DC.  If patient discharges after hours, please call [phone].   [name] 01/21/2016, 11:00 PM

## 2016-01-21 NOTE — Progress Notes (Signed)
[name]Y - ADULT TOTAL PARENTERAL NUTRITION CONSULT NOTE   Pharmacy Consult for TPN Indication: PTA TPN continuation, intolerance to enteral feed  Insulin Requirements: 3 units /24 h   Current Nutrition: PTA TPN [facility], pt received 405 grams (1376 kcal)  of dextrose, 75 grams (300 kcal) of protein and 50 grams ( 480 kcal) of lipid for a total of 2156 kcal/day   IVF: D5 1/2NS with 20 meq at 100 ml/hr   Central access: PICC TPN start date: PTA, restarted on admission on 12/6   ASSESSMENT                                                                                                          HPI: [age] yoM with hx PUD, CAD, GERD, BPH,  s/p multiple procedures  and extended hospitalization for recurrent partial GOO 2/2 pyloric stricture on TPN for several days, discharged 11/15 with J/G tube in place on TFs, unable to tolerate and had to [facility], readmitted 12/6 with partial SBO. Pharmacy consulted to resume TPN.  Significant events:  12/6: TPN continued on admission. [facility]: Awaiting RD recommendations  Today:   Glucose - No hx DM noted, CBGs sl elevated - SSI q6h  Electrolytes - Na low, trending down, CorrCa, K Phos WNL, Mg low - MD replaced  Renal - SCr WNL  LFTs - WNL  TGs - 177 ( 11/27), 93 (12/7)  Prealbumin - 15.3 ( 11/13), 10.8 (12/7)  NUTRITIONAL GOALS                                                                                             RD recs: this admission: pending   Last admission RD notes on 11/15: Kcal: 1900-2100 kcal/day, Protein 100-115g/day, Fluids: per MD/NP/surgery  [facility] TPN: 2156 Kcal/day, Protein 75g/day  Clinimix E 5/15 at a goal rate of 83 ml/hr + 20% fat emulsion at  10 ml/hr to provide: 100g/day protein, 1894 Kcal/day.  **There is currently a Acupuncturistnational backorder of Clinimix solution. To conserve supply, will keep goal rate of Clinimix 5/15 at 83 ml/hr +  20% fat emulsion at 1410ml/hr  PLAN  At 1800 today:  Change to Clinimix E 5/15 at 83 ml/hr.  20% fat emulsion at 10 ml/hr.  TPN to contain standard multivitamins and trace elements.  Fluids per MD/surgery  Continue sensitive SSI coverage q6h .   TPN lab panels on Mondays & Thursdays.  F/u RD recommendations, specifically regarding protein needs due to large [facility] recs from last admission.   F/u daily  Haynes Hoehnolleen Boston Catarino, PharmD, BCPS 01/21/2016, 11:44 AM  Pager: [phone]

## 2016-01-21 NOTE — Evaluation (Addendum)
SLP Cancellation Note  Patient Details Name: [name] MRN: [id number] DOB: 28-Apr-1940   Cancelled treatment:       Reason Eval/Treat Not Completed: Other (comment) (pt strictly NPO currently due to bowel issues, spoke to surgery and asked for MBS order when pt able to have done due to h/o significant dysphagia with sensorimotor deficits)  Per surgery, pt was placed on diet after a blue dye test at facility.     Donavan Burnetamara Toccara Alford, MS [facility] SLP [phone]

## 2016-01-21 NOTE — Clinical Social Work Note (Signed)
Psychosocial assessment and FL-2 completed and faxed via HUB to [facility] for review.   Patient was discharged to [facility] on 11/15.  MSW has reviewed chart and remain available as needed.   Derenda FennelBashira Jamila Slatten, MSW [phone] 01/21/2016 2:38 PM

## 2016-01-21 NOTE — Progress Notes (Addendum)
Initial Nutrition Assessment  DOCUMENTATION CODES:   Severe malnutrition in context of acute illness/injury  INTERVENTION:   TPN per Pharmacy -Estimated needs below RD to continue to follow for plan  NUTRITION DIAGNOSIS:   Inadequate oral intake related to inability to eat as evidenced by NPO status.  GOAL:   Patient will meet greater than or equal to 90% of their needs  MONITOR:   Labs, Weight trends, Skin, I & O's, Other (Comment) (TPN)  REASON FOR ASSESSMENT:   Consult New TPN/TNA  ASSESSMENT:   75 year-old male with a medical history of GERD, PUD, and CAD who presented to [facility] via EMS with a cc of nausea, vomiting, and diarrhea. Patient is s/p Robotic anterior and posterior vagotomy, BI anastomosis, DOR fundoplication, omentopexy 09/2015 for partial GOO secondary to pyloric stricture, followed by placement of feeding jejunostomy and gastrostomy tube and EGD balloon dilation of duodenal stricture 12/01/15 for recurrent GOO secondary to edema at anastomosis and failure to thrive with malnutrition. On 12/03/15 the patient was taken to the OR for a diagnostic laparoscopy, omentopexy of jejunal disruption, and washout for jejunal disruption (pt pulled out jejunostomy tube) and gastric leak.   Patient familiar to nutrition team from previous admission.  Pt in room with surgical PA at time of visit, no family present at this time.  Per chart review, pt continued to receive bolus feeds via PEG at SNF (Osmolite 1.5, 6 cans daily) up until 3 days ago when patient passed a blue dye study. Pt was then placed on a diet and was consuming foods such as oatmeal which caused diarrhea. Pt then developed nausea and vomiting x 48 hours. Pt was found to have SBO and [facility]   Per discussion with surgical PA, pt will remain NPO for now. SLP will evaluate patient when deemed appropriate. Pt pulled NGT today but will not be replaced unless pt  develops N/V.  Pt has lost 27 lb since 8/11 (13% wt loss x 4 months, significant for time frame). Question weights in chart from 11/28 - 12/1 as they are the exact same in kg to the decimal. Used 12/5 weight for estimated needs which have increased d/t continued malnutrition. Nutrition-Focused physical exam completed. Findings are moderate fat depletion, moderate muscle depletion, and no edema.   Medications:  IV Mg sulfate once   Labs reviewed: CBGs: 140-163 Low Na, Mg Phos/K WNL TG: 93 mg/dL Elevated Vitamin Z-[id number]-12  Plan per Pharmacy 12/8: At 1800 today:  Change to Clinimix E 5/15 at 83 ml/hr.  20% fat emulsion at 10 ml/hr.  Diet Order:  TPN (CLINIMIX-E) Adult TPN (CLINIMIX-E) Adult  Skin:   Stage II sacral wound  Last BM:  12/7  Height:   Ht Readings from Last 1 Encounters:  01/19/16 6\' 2"  (1.88 m)    Weight:   Wt Readings from Last 1 Encounters:  01/19/16 174 lb 8 oz (79.2 kg)    Ideal Body Weight:  86.4 kg  BMI:  22 kg/m^2  Estimated Nutritional Needs:   Kcal:  2000-2200  Protein:  100-115g  Fluid:  2L/day  EDUCATION NEEDS:   No education needs identified at this time  Danny FrancoLindsey Quasim Doyon, MS, RD, LDN Pager: [phone] After Hours Pager: [phone]

## 2016-01-21 NOTE — Progress Notes (Signed)
While ambulating patient into the chair, pt's NG tube was pulled out. Spoke with Marisue IvanLiz (PA) who said she would not write orders for it to be reinserted again unless the pt was to start vomiting again. Sophee Mckimmy R McClean

## 2016-01-22 LAB — GLUCOSE, CAPILLARY
GLUCOSE-CAPILLARY: 107 mg/dL — AB (ref 65–99)
GLUCOSE-CAPILLARY: 110 mg/dL — AB (ref 65–99)
GLUCOSE-CAPILLARY: 118 mg/dL — AB (ref 65–99)
Glucose-Capillary: 125 mg/dL — ABNORMAL HIGH (ref 65–99)

## 2016-01-22 LAB — MAGNESIUM: Magnesium: 2 mg/dL (ref 1.7–2.4)

## 2016-01-22 LAB — COMPREHENSIVE METABOLIC PANEL
ALT: 11 U/L — AB (ref 17–63)
AST: 16 U/L (ref 15–41)
Albumin: 1.4 g/dL — ABNORMAL LOW (ref 3.5–5.0)
Alkaline Phosphatase: 94 U/L (ref 38–126)
Anion gap: 3 — ABNORMAL LOW (ref 5–15)
BUN: 19 mg/dL (ref 6–20)
CHLORIDE: 102 mmol/L (ref 101–111)
CO2: 28 mmol/L (ref 22–32)
CREATININE: 0.62 mg/dL (ref 0.61–1.24)
Calcium: 7.5 mg/dL — ABNORMAL LOW (ref 8.9–10.3)
GFR calc Af Amer: >60 mL/min (ref >60)
GFR calc non Af Amer: >60 mL/min (ref >60)
GLUCOSE: 125 mg/dL — AB (ref 65–99)
Potassium: 3.6 mmol/L (ref 3.5–5.1)
SODIUM: 133 mmol/L — AB (ref 135–145)
Total Bilirubin: 0.4 mg/dL (ref 0.3–1.2)
Total Protein: 4.9 g/dL — ABNORMAL LOW (ref 6.5–8.1)

## 2016-01-22 LAB — IRON AND TIBC
Iron: 290 ug/dL — ABNORMAL HIGH (ref 45–182)
Saturation Ratios: 241 % — ABNORMAL HIGH (ref 17.9–39.5)
TIBC: 120 ug/dL — ABNORMAL LOW (ref 250–450)

## 2016-01-22 LAB — PHOSPHORUS: PHOSPHORUS: 3.8 mg/dL (ref 2.5–4.6)

## 2016-01-22 MED ORDER — ALBUTEROL SULFATE (2.5 MG/3ML) 0.083% IN NEBU: 2.5000 mg | INHALATION_SOLUTION | Freq: Four times a day (QID) | RESPIRATORY_TRACT | Status: DC | PRN

## 2016-01-22 MED ORDER — SODIUM CHLORIDE 0.9 % IV SOLN
30.0000 meq | Freq: Once | INTRAVENOUS | Status: AC
  Administered 2016-01-22: 30 meq via INTRAVENOUS
  Filled 2016-01-22: qty 15

## 2016-01-22 MED ORDER — TRACE MINERALS CR-CU-MN-SE-ZN 10-1000-500-60 MCG/ML IV SOLN
INTRAVENOUS | Status: AC
  Administered 2016-01-22: 18:00:00 via INTRAVENOUS
  Filled 2016-01-22: qty 1992

## 2016-01-22 MED ORDER — FAT EMULSION 20 % IV EMUL
240.0000 mL | INTRAVENOUS | Status: AC
  Administered 2016-01-22: 240 mL via INTRAVENOUS
  Filled 2016-01-22: qty 250

## 2016-01-22 NOTE — Progress Notes (Signed)
Pt continued calling out and showed signs of delirium.  Pt's long term memory intact, but does not recall where he is or why he is here. Reoriented.  Pt able to lift both arms above his head equally. Pt able to turn palms over with no difficulty. Pt able to smile with no droop. Pt able to count using only even numbers quickly. Pt on continuous pulse ox that has not gone below 98%.  Haldol was given at 1700.  Pt bathed. Pt able to reiterate correct orientation questions after bath. Pt calmly sleeping at this time. Sundowning?  Will continue to monitor.  Sherron MondayGood, Carianne Taira L

## 2016-01-22 NOTE — Progress Notes (Signed)
Routine assessment of artificial airway completed. [name] ties noted to be extremely loose fitting and trach tube crooked. RN at bedside for assistance if needed. Trach tube straightened and secured. Good air movement noted. Patient able to speak. BBS heard. Congested cough noted. VSS. Positive color change on CO2 adapter. RT will continue to follow.

## 2016-01-22 NOTE — Progress Notes (Signed)
Nutrition Follow-up  DOCUMENTATION CODES:   Severe malnutrition in context of acute illness/injury  INTERVENTION:  Continue TPN per pharmacy.  RD to continue to follow for plan.   NUTRITION DIAGNOSIS:   Inadequate oral intake related to inability to eat as evidenced by NPO status.  Ongoing - addressing with TPN.  GOAL:   Patient will meet greater than or equal to 90% of their needs  Met with TPN.  MONITOR:   Labs, Weight trends, Skin, I & O's, Other (Comment) (TPN)  REASON FOR ASSESSMENT:   Consult New TPN/TNA  ASSESSMENT:   75 year-old male with a medical history of GERD, PUD, and CAD who presented to [place] ED from [facility] via EMS with a cc of nausea, vomiting, and diarrhea. Patient is s/p Robotic anterior and posterior vagotomy, BI anastomosis, DOR fundoplication, omentopexy 09/2015 for partial GOO secondary to pyloric stricture, followed by placement of feeding jejunostomy and gastrostomy tube and EGD balloon dilation of duodenal stricture 12/01/15 for recurrent GOO secondary to edema at anastomosis and failure to thrive with malnutrition. On 12/03/15 the patient was taken to the OR for a diagnostic laparoscopy, omentopexy of jejunal disruption, and washout for jejunal disruption (pt pulled out jejunostomy tube) and gastric leak.   -CT Abdomen/Pelvis w/ Contrast on 12/6 found SBO involving ileum at the anterior aspect of the right lower quadrant. Per MD likely due to adhesion. -Per DG Abdominal study on 12/7 - patient with interim partial resolution small bowel distention and no free air. -Per Surgery note from yesterday, plan is for repeat of DG Abd this morning.  Spoke with patient and wife at bedside. Patient is experiencing some abdominal pain, but reports had a bowel movement yesterday. Denies nausea. Wife reports that with the TF regimen via G-tube at SNF patient had been experiencing diarrhea. She believes they might have switched the formula  at SNF, but cannot recall. She may be referring to the switch made in the hospital from Vital AF 1.2 at continuous rate via J-tube to Jevity 1.2 bolus regimen via G-tube.   TPN: Running at goal regimen of Clinimix E 5/15 @ 83 ml/hr + 20% ILE @ 10 ml/hr. This regimen provides 1992 ml fluid, 1897 kcal (95% minimum estimated kcal needs), 100 grams protein daily (100% minimum estimated protein needs).  Access: Chart documents that patient has both jejunostomy and gastrostomy. Confirmed with patient/family and by exam that patient only has G-tube at this time. J-tube was removed.  Medications reviewed and include: Novolog sliding scale Q6hrs (received 2 units past 24 hrs), pantoprazole, potassium chloride 30 mEq once today.  Labs reviewed: CBG 118-142 past 24 hrs, Sodium 133, Anion gap 3, Albumin 1.4, ALT 11, Total Protein 4.9.  Discussed with RN.  Diet Order:  TPN (CLINIMIX-E) Adult TPN (CLINIMIX-E) Adult  Skin:     Last BM:  12/7  Height:   Ht Readings from Last 1 Encounters:  01/21/16 6' 2" (1.88 m)    Weight:   Wt Readings from Last 1 Encounters:  01/21/16 175 lb 9.6 oz (79.7 kg)    Ideal Body Weight:  86.4 kg  BMI:  Body mass index is 22.55 kg/m.  Estimated Nutritional Needs:   Kcal:  2000-2200  Protein:  100-115g  Fluid:  2L/day  EDUCATION NEEDS:   No education needs identified at this time   Stephens, MS, RD, LDN Pager: [phone] After Hours Pager: [phone]  

## 2016-01-22 NOTE — Progress Notes (Signed)
Subjective: Doing better this AM, he is actually having some stool from the rectum this AM.  His gastrostomy site looks OK.  He has not been mobilized at the SNF for some time.  I did not look at the decubitus because of the stooling.    Objective: Vital signs in last 24 hours: Temp:  [97.7 F (36.5 C)-98.7 F (37.1 C)] 97.7 F (36.5 C) (12/08 0612) Pulse Rate:  [54-81] 80 (12/08 1241) Resp:  [16-18] 16 (12/08 1241) BP: (120-145)/(54-64) 145/64 (12/08 0612) SpO2:  [97 %-99 %] 98 % (12/08 0730) FiO2 (%):  [28 %] 28 % (12/08 1241) Weight:  [79.7 kg (175 lb 9.6 oz)] 79.7 kg (175 lb 9.6 oz) (12/07 1400) Last BM Date: 01/21/16 1600 IV Urine 1700 NG 700 recorded BM 0 Afebrile, VSS Na 133 FE is high CXR yesterday show LLL infiltrate and small bilateral effusions ABD film yesterday shows: Surgical clips and sutures right upper quadrant. NG tube and gastrostomy tube noted in stable position. Soft tissue structures are unremarkable. Interim partial resolution small bowel distention. No free air.  Intake/Output from previous day: 12/07 0701 - 12/08 0700 In: 1645.9 [I.V.:1545.9; IV Piggyback:100] Out: 2400 [Urine:1700; Emesis/NG output:700] Intake/Output this shift: Total I/O In: 637 [I.V.:372; IV Piggyback:265] Out: 550 [Urine:550]  General appearance: alert, cooperative, no distress and comfortable on trach Resp: decreased BS at the bases GI: soft, G tube is OK, site is OK, being well dressed.  few bS, no distension  Lab Results:   Recent Labs  01/20/16 0158 01/21/16 0437  WBC 12.7* 6.9  HGB 8.1* 9.3*  HCT 25.0* 29.1*  PLT 366 308    BMET  Recent Labs  01/21/16 0437 01/22/16 0511  NA 129* 133*  K 3.6 3.6  CL 99* 102  CO2 27 28  GLUCOSE 182* 125*  BUN 17 19  CREATININE 0.65 0.62  CALCIUM 7.5* 7.5*   PT/INR No results for input(s): LABPROT, INR in the last 72 hours.   Recent Labs Lab 01/20/16 0158 01/21/16 0437 01/22/16 0511  AST 21 17 16   ALT 14*  12* 11*  ALKPHOS 111 109 94  BILITOT 0.5 0.5 0.4  PROT 5.6* 5.3* 4.9*  ALBUMIN 1.6* 1.5* 1.4*     Lipase     Component Value Date/Time   LIPASE 55 (H) 01/20/2016 0158     Studies/Results: Dg Abd 1 View  Result Date: 01/21/2016 CLINICAL DATA:  Abdominal distention. EXAM: ABDOMEN - 1 VIEW COMPARISON:  01/20/2016 .  CT 01/20/2016 . FINDINGS: Surgical clips and sutures right upper quadrant. NG tube and gastrostomy tube noted in stable position. Soft tissue structures are unremarkable. Interim partial resolution small bowel distention. No free air. IMPRESSION: 1. Surgical clips sutures in the right upper quadrant. NG tube and gastrostomy tube in stable position. 2.  Interim partial resolution of small bowel distention. Electronically Signed   By: Maisie Fushomas  Register   On: 01/21/2016 08:25   Dg Chest Port 1 View  Result Date: 01/21/2016 CLINICAL DATA:  Cough. EXAM: PORTABLE CHEST 1 VIEW COMPARISON:  01/20/2016. FINDINGS: Tracheostomy tube noted in stable position. PICC line noted with tip projected over superior vena cava. Heart size normal. Unchanged mild left lower lobe infiltrate periods unchanged small bilateral pleural effusions. IMPRESSION: 1. Tracheostomy tube and right PICC line stable position. 2. Unchanged left lower lobe infiltrate and small bilateral pleural effusions. Electronically Signed   By: Maisie Fushomas  Register   On: 01/21/2016 11:53   Prior to Admission medications   Medication  Sig Start Date End Date Taking? Authorizing Provider  aluminum-magnesium hydroxide-simethicone (MAALOX) 200-200-20 MG/5ML SUSP Take 30 mLs by mouth every 6 (six) hours as needed.   Yes Historical Provider, MD  Amino Ac Elect-Calc in D10W (CLINIMIX E/DEXTROSE, 2.75/10,) 2.75 % SOLN Inject 2,160 mLs into the vein as needed.   Yes Historical Provider, MD  aspirin 81 MG chewable tablet Place 81 mg into feeding tube every evening.   Yes Historical Provider, MD  atorvastatin (LIPITOR) 40 MG tablet Place 40 mg into  feeding tube at bedtime.    Yes Historical Provider, MD  bisacodyl (DULCOLAX) 10 MG suppository Place 10 mg rectally daily as needed for moderate constipation or severe constipation.   Yes Historical Provider, MD  bismuth subsalicylate (PEPTO BISMOL) 262 MG/15ML suspension Place 30 mLs into feeding tube every 8 (eight) hours as needed for indigestion or diarrhea or loose stools.   Yes Historical Provider, MD  calcium-vitamin D (OSCAL WITH D) 500-200 MG-UNIT tablet Place 1 tablet into feeding tube daily with breakfast.    Yes Historical Provider, MD  Cyanocobalamin (VITAMIN B-12) 1000 MCG SUBL Place 1 tablet under the tongue daily with breakfast.   Yes Historical Provider, MD  Ferrous Sulfate 220 (44 Fe) MG/5ML LIQD Place 7.5 mLs into feeding tube at bedtime.    Yes Historical Provider, MD  FLUoxetine (PROZAC) 20 MG/5ML solution Place 20 mg into feeding tube daily.   Yes Historical Provider, MD  haloperidol (HALDOL) 0.5 MG tablet Take 0.25-0.5 mg by mouth 3 (three) times daily. At 6 am and 4 PM and 9 PM   Yes Historical Provider, MD  haloperidol (HALDOL) 0.5 MG tablet Place 0.5 mg into feeding tube as needed for agitation. STOP DATE 01/24/2016 01/10/16 01/24/16 Yes Historical Provider, MD  loperamide (IMODIUM) 1 MG/5ML solution 10 mg. Take 10 mls via tube every 8 hours as needed for more than 2 bms every 8 hours.   Yes Historical Provider, MD  magic mouthwash SOLN Take 15 mLs by mouth 4 (four) times daily as needed for mouth pain (sore throat). 12/30/15  Yes Karie Soda, MD  metoCLOPramide (REGLAN) 5 MG/5ML solution Place 10 mg into feeding tube every 6 (six) hours as needed for nausea or vomiting.   Yes Historical Provider, MD  metoprolol tartrate (LOPRESSOR) 25 MG tablet Place 25 mg into feeding tube daily.   Yes Historical Provider, MD  nicotine (NICODERM CQ - DOSED IN MG/24 HR) 7 mg/24hr patch Place 7 mg onto the skin daily. Rotate site   Yes Historical Provider, MD  nitroGLYCERIN (NITROSTAT) 0.4  MG SL tablet Place 0.4 mg under the tongue every 5 (five) minutes as needed for chest pain.   Yes Historical Provider, MD  Nutritional Supplements (FEEDING SUPPLEMENT, OSMOLITE 1.2 CAL,) LIQD Place 237 mLs into feeding tube. Give 1 can via G-tube every day at  10 am and  6 pm.   Yes Historical Provider, MD  omeprazole (PRILOSEC) 40 MG capsule Take 40 mg by mouth 2 (two) times daily.   Yes Historical Provider, MD  Polyvinyl Alcohol-Povidone (REFRESH OP) Place 2 drops into both eyes as needed (For dry eyes.).    Yes Historical Provider, MD  potassium chloride (KCL) 2 mEq/mL SOLN oral liquid Take 30 mEq by mouth daily.   Yes Historical Provider, MD  RESTASIS 0.05 % ophthalmic emulsion Place 1 drop into both eyes 2 (two) times daily.  04/16/13  Yes Historical Provider, MD  traZODone (DESYREL) 50 MG tablet Place 100 mg into feeding tube  at bedtime.    Yes Historical Provider, MD  Water For Irrigation, Sterile (STERILE WATER FOR IRRIGATION) Irrigate with 250 mLs as directed every 6 (six) hours.    Historical Provider, MD     Medications: . bisacodyl  10 mg Rectal Daily  . heparin  5,000 Units Subcutaneous Q8H  . insulin aspart  0-9 Units Subcutaneous Q6H  . lip balm  1 application Topical BID  . liver oil-zinc oxide   Topical BID  . pantoprazole (PROTONIX) IV  40 mg Intravenous Q12H   . [name].TPN (CLINIMIX-E) Adult 83 mL/hr at 01/22/16 1056   And  . fat emulsion 240 mL (01/21/16 1702)  . [name].TPN (CLINIMIX-E) Adult     And  . fat emulsion     CAD with stents [id number] - DR. H Smith  Assessment/Plan PSBO with 2 days of nausea and vomiting on admit 01/20/16 S/P Robotic anterior and posterior vagotomy, BI anastomosis, DOR fundoplication, omentopexy for partial GOO 09/2015, DR. Viviann SpareSTEVEN GROSS  Feeding Jejunostomy and gastrostomy/EGD balloon dilatation, secondary to edema at anastomosis, 12/01/15, DR. Karie SodaSteven Gross S/p diagnostic laparoscopy, omentopexy of jejunal disruption, and washout for jejunal disruption  (pt pulled out jejunostomy tube) and gastric leak. 12/03/15, Dr. Karie SodaSteven Gross (pt pulled out jejunostomy tube) Malnutrition formerly on TF/now on TNA FEN:  NPO/TNA ID:  No abx for 48 hours DVT: Heparin/SCD    Plan:  I am going to give him some ice chips and sips and see how he does>  OT and PT to see and start moving around some.     LOS: 2 days    Nakeshia Waldeck 01/22/2016 [phone]

## 2016-01-22 NOTE — Progress Notes (Signed)
PT Cancellation Note  Patient Details Name: [name] MRN: [id number] DOB: [birth year]   Cancelled Treatment:    Reason Eval/Treat Not Completed: Patient declined, no reason specified (pt was OOB with nsg earlier today)   [facility]osp[facility]War[facility] CampusWILLIAMS,Ameliarose Shark 01/22/2016, 11:27 AM

## 2016-01-22 NOTE — Evaluation (Signed)
Physical Therapy Evaluation Patient Details Name: Ardyth GalRalph T Boettcher MRN: [id number] DOB: 02/11/1941 Today's Date: 01/22/2016   History of Present Illness  75 year-old male with a medical history of GERD, PUD, and CAD who presented to [facility] from [facility] via EMS with a cc of nausea, vomiting, and diarrhea. Patient is s/p Robotic anterior and posterior vagotomy, BI anastomosis, DOR fundoplication, omentopexy 09/2015 for partial GOO secondary to pyloric stricture, followed by placement of feeding jejunostomy and gastrostomy tube and EGD balloon dilation of duodenal stricture 12/01/15 for recurrent GOO secondary to edema at anastomosis and failure to thrive with malnutrition. On 12/03/15 the patient was taken to the OR for a diagnostic laparoscopy, omentopexy of jejunal disruption, and washout for jejunal disruption (pt pulled out jejunostomy tube) and gastric leak.  Tracheostomy 12/11/15.  Pt admitted 01/19/16 for partial small bowel obstruction Likely secondary to intraabdominal adhesions   Clinical Impression  Pt admitted with above diagnosis. Pt currently with functional limitations due to the deficits listed below (see PT Problem List).  Pt will benefit from skilled PT to increase their independence and safety with mobility to allow discharge to the venue listed below.  Family reports pt not participating in PT very much at SNF, was not ambulating, hardly getting up to chair, performing some exercises.  [name] assists with motivating pt to mobilize.  Pt only agreeable to stand today however performed twice.  Pt will require continued rehab upon d/c.     Follow Up Recommendations SNF;Supervision/Assistance - 24 hour    Equipment Recommendations  None recommended by PT    Recommendations for Other Services       Precautions / Restrictions Precautions Precautions: Fall Precaution Comments: Gastric tube, trach collar      Mobility  Bed Mobility Overal bed  mobility: Needs Assistance Bed Mobility: Supine to Sit;Sit to Supine     Supine to sit: Min assist;HOB elevated Sit to supine: Min guard   General bed mobility comments: assist for trunk upright, pt able to return to supine without assist  Transfers Overall transfer level: Needs assistance Equipment used: Rolling walker (2 wheeled) Transfers: Sit to/from Stand Sit to Stand: Mod assist;+2 physical assistance         General transfer comment: pt able to perform sit to stand with verbal cues for hand placement and min assist first time, however pt fatigues quickly and second sit to stand required mod assist; pt declined any further mobility at this time  Ambulation/Gait                Stairs            Wheelchair Mobility    Modified Rankin (Stroke Patients Only)       Balance                                             Pertinent Vitals/Pain Pain Assessment: No/denies pain    Home Living Family/patient expects to be discharged to:: Private residence Living Arrangements: Spouse/significant other Available Help at Discharge: Family Type of Home: House Home Access: Stairs to enter Entrance Stairs-Rails: LawyerLeft;Right Entrance Stairs-Number of Steps: 3 Home Layout: One level Home Equipment: None Additional Comments: prior to last admission in October, pt discharged to SNF last admission and admitted from SNF    Prior Function Level of Independence: Independent  Hand Dominance        Extremity/Trunk Assessment   Upper Extremity Assessment: Generalized weakness           Lower Extremity Assessment: Generalized weakness         Communication   Communication: Tracheostomy;Passy-Muir valve  Cognition Arousal/Alertness: Awake/alert Behavior During Therapy: WFL for tasks assessed/performed Overall Cognitive Status: Within Functional Limits for tasks assessed                      General Comments       Exercises     Assessment/Plan    PT Assessment Patient needs continued PT services  PT Problem List Decreased strength;Decreased activity tolerance;Decreased balance;Decreased mobility;Cardiopulmonary status limiting activity;Decreased knowledge of precautions;Decreased safety awareness;Decreased knowledge of use of DME;Decreased skin integrity          PT Treatment Interventions DME instruction;Functional mobility training;Gait training;Therapeutic activities;Therapeutic exercise;Balance training;Patient/family education    PT Goals (Current goals can be found in the Care Plan section)  Acute Rehab PT Goals Patient Stated Goal: to get home PT Goal Formulation: With patient/family Time For Goal Achievement: 02/05/16 Potential to Achieve Goals: Good    Frequency Min 3X/week   Barriers to discharge        Co-evaluation               End of Session Equipment Utilized During Treatment: Oxygen;Gait belt Activity Tolerance: Patient limited by fatigue Patient left: with call bell/phone within reach;with family/visitor present;in bed           Time: [id number] PT Time Calculation (min) (ACUTE ONLY): 15 min   Charges:   PT Evaluation $PT Eval Moderate Complexity: 1 Procedure     PT G Codes:        Conley Pawling,KATHrine E 01/22/2016, 3:27 PM Zenovia JarredKati Lashana Spang, PT, DPT 01/22/2016 Pager: [phone]

## 2016-01-22 NOTE — Progress Notes (Signed)
[name].PHARMACY - ADULT TOTAL PARENTERAL NUTRITION CONSULT NOTE   Pharmacy Consult for TPN Indication: PTA TPN continuation, intolerance to enteral feed  Insulin Requirements: 4 units /24 h   Current Nutrition: PTA TPN from advanced home care [facility], pt received 405 grams (1376 kcal)  of dextrose, 75 grams (300 kcal) of protein and 50 grams ( 480 kcal) of lipid for a total of 2156 kcal/day   IVF: D5 1/2NS with 20 meq at 100 ml/hr   Central access: PICC TPN start date: PTA, restarted on admission on 12/6   ASSESSMENT                                                                                                          HPI: [id number] yoM with hx PUD, CAD, GERD, BPH,  s/p multiple procedures  and extended hospitalization for recurrent partial GOO 2/2 pyloric stricture on TPN for several days, discharged 11/15 with J/G tube in place on TFs, unable to tolerate and had to [facility], readmitted 12/6 with partial SBO. Pharmacy consulted to resume TPN.  Significant events:  12/6: TPN continued on admission. [facility] 12/[facility]: Pt pulled out NGT, not replaced, remains NPO  Today:   Glucose - No hx DM noted, CBGs at goal - SSI q6h  Electrolytes - Na low, small improvement, Mg now WNL after replacement 12/7, CorrCa, K, Phos WNL  Renal - SCr WNL  LFTs - WNL  TGs - 177 ( 11/27), 93 (12/7)  Prealbumin - 15.3 ( 11/13), 10.8 (12/7)  NUTRITIONAL GOALS                                                                                             RD recs (12/7): Kcal 2000-2200, Protein 100-115g, Fluid 2L/day  [facility] TPN: 2156 Kcal/day, Protein 75g/day  Clinimix E 5/15 at a goal rate of 83 ml/hr + 20% fat emulsion at  10 ml/hr to provide: 100g/day protein, 1894 Kcal/day.  **There is currently a Acupuncturistnational backorder of Clinimix solution. To conserve supply, will keep goal rate of Clinimix 5/15 at 83 ml/hr + 20% fat emulsion at 7510ml/hr  PLAN  At 1800 today:  Continue Clinimix E 5/15 at 83 ml/hr.  20% fat emulsion at 10 ml/hr.  TPN to contain standard multivitamins and trace elements.  Fluids per MD/surgery  Continue sensitive SSI coverage q6h .   TPN lab panels on Mondays & Thursdays  F/u daily  [name], PharmD, BCPS 01/22/2016, 9:26 AM  Pager: [phone]

## 2016-01-23 LAB — GLUCOSE, CAPILLARY
GLUCOSE-CAPILLARY: 124 mg/dL — AB (ref 65–99)
GLUCOSE-CAPILLARY: 93 mg/dL (ref 65–99)
Glucose-Capillary: 113 mg/dL — ABNORMAL HIGH (ref 65–99)
Glucose-Capillary: 119 mg/dL — ABNORMAL HIGH (ref 65–99)

## 2016-01-23 MED ORDER — FAT EMULSION 20 % IV EMUL
240.0000 mL | INTRAVENOUS | Status: AC
  Administered 2016-01-23: 240 mL via INTRAVENOUS
  Filled 2016-01-23: qty 250

## 2016-01-23 MED ORDER — TRACE MINERALS CR-CU-MN-SE-ZN 10-1000-500-60 MCG/ML IV SOLN
INTRAVENOUS | Status: AC
  Administered 2016-01-23: 17:00:00 via INTRAVENOUS
  Filled 2016-01-23: qty 1992

## 2016-01-23 NOTE — Progress Notes (Signed)
[name].PHARMACY - ADULT TOTAL PARENTERAL NUTRITION CONSULT NOTE   Pharmacy Consult for TPN Indication: PTA TPN continuation, intolerance to enteral feed  Insulin Requirements: 1 units /24 h   Current Nutrition: PTA TPN from advanced home care [facility], pt received 405 grams (1376 kcal)  of dextrose, 75 grams (300 kcal) of protein and 50 grams ( 480 kcal) of lipid for a total of 2156 kcal/day  IVF: D5 1/2NS with 20 meq at 100 ml/hr   Central access: PICC TPN start date: PTA, restarted on admission on 12/6   ASSESSMENT                                                                                                          HPI: [id number] yoM with hx PUD, CAD, GERD, BPH,  s/p multiple procedures  and extended hospitalization for recurrent partial GOO 2/2 pyloric stricture on TPN for several days, discharged 11/15 with J/G tube in place on TFs, unable to tolerate and had to transition [facility] on 11/22, readmitted 12/6 with partial SBO. Pharmacy consulted to resume TPN.  Significant events:  12/6: TPN continued on admission. [facility] 12/7: Pt pulled out NGT, not replaced, remains NPO 12/8: trial of ice chips  Today:   Glucose - No hx DM noted, CBGs at goal - SSI q6h  Electrolytes - (12/8) Na low, small improvement, Mg now WNL, CorrCa, K, Phos WNL  Renal - SCr WNL  LFTs - WNL  TGs - 177 ( 11/27), 93 (12/7)  Prealbumin - 15.3 ( 11/13), 10.8 (12/7)  NUTRITIONAL GOALS                                                                                             RD recs (12/7): Kcal 2000-2200, Protein 100-115g, Fluid 2L/day  [facility]: 2156 Kcal/day, Protein 75g/day  Clinimix E 5/15 at a goal rate of 83 ml/hr + 20% fat emulsion at  10 ml/hr to provide: 100g/day protein, 1894 Kcal/day.  **There is currently a Acupuncturistnational backorder of Clinimix solution. To conserve supply, will keep goal rate of Clinimix 5/15 at 83 ml/hr + 20% fat emulsion at 7110ml/hr  PLAN  At 1800 today:  Continue Clinimix E 5/15 at 83 ml/hr.  20% fat emulsion at 10 ml/hr.  TPN to contain standard multivitamins and trace elements.  Fluids per MD/surgery  Continue sensitive SSI coverage q6h .   TPN lab panels on Mondays & Thursdays  F/u daily  Otho BellowsGreen, Effie Janoski L PharmD Pager [phone] 01/23/2016, 9:02 AM

## 2016-01-23 NOTE — Progress Notes (Signed)
Appreciate clarification of MBS order. [facility] notes[facility] Will f/u 12/11.   Ferdinand LangoLeah Tezra Mahr MA, CCC-SLP [phone]

## 2016-01-23 NOTE — Progress Notes (Signed)
Received information from radiology that MBS had been ordered. Do not have MBS order on SLP end however see radiology order in chart. Also see notation from PA for trials of sips and chips today. Given h/o dysphagia, would recommend sips and chips only after oral care to decrease risk of aspiration related infection. Discussed with RN who is questioning if patient appropriate for MBS today. MD has not seen patient yet this am. Will hold MBS this am and wait for further direction. MD, please clarify readiness for MBS. Thank you!  Danny LangoLeah Wreatha Sturgeon MA, CCC-SLP [phone]

## 2016-01-23 NOTE — Progress Notes (Signed)
Subjective: Seems to be feeling ok   Objective: Vital signs in last 24 hours: Temp:  [97.5 F (36.4 C)-98.3 F (36.8 C)] 98.1 F (36.7 C) (12/09 0528) Pulse Rate:  [71-92] 82 (12/09 0833) Resp:  [16] 16 (12/09 0833) BP: (119-141)/(54-66) 141/66 (12/09 0528) SpO2:  [98 %-100 %] 98 % (12/09 0833) FiO2 (%):  [28 %] 28 % (12/09 0833) Last BM Date: 01/22/16  CXR yesterday show LLL infiltrate and small bilateral effusions ABD film yesterday shows: Surgical clips and sutures right upper quadrant. NG tube and gastrostomy tube noted in stable position. Soft tissue structures are unremarkable. Interim partial resolution small bowel distention. No free air.  Intake/Output from previous day: 12/08 0701 - 12/09 0700 In: 2681.6 [P.O.:180; I.V.:2236.6; IV Piggyback:265] Out: 2100 [Urine:2100] Intake/Output this shift: Total I/O In: -  Out: 150 [Urine:150]  General appearance: alert, cooperative, no distress and comfortable on trach Resp: decreased BS at the bases GI: soft, G tube is OK, site is OK, being well dressed.  few bS, no distension  Lab Results:   Recent Labs  01/21/16 0437  WBC 6.9  HGB 9.3*  HCT 29.1*  PLT 308    BMET  Recent Labs  01/21/16 0437 01/22/16 0511  NA 129* 133*  K 3.6 3.6  CL 99* 102  CO2 27 28  GLUCOSE 182* 125*  BUN 17 19  CREATININE 0.65 0.62  CALCIUM 7.5* 7.5*   PT/INR No results for input(s): LABPROT, INR in the last 72 hours.   Recent Labs Lab 01/20/16 0158 01/21/16 0437 01/22/16 0511  AST 21 17 16   ALT 14* 12* 11*  ALKPHOS 111 109 94  BILITOT 0.5 0.5 0.4  PROT 5.6* 5.3* 4.9*  ALBUMIN 1.6* 1.5* 1.4*     Lipase     Component Value Date/Time   LIPASE 55 (H) 01/20/2016 0158     Studies/Results: Dg Chest Port 1 View  Result Date: 01/21/2016 CLINICAL DATA:  Cough. EXAM: PORTABLE CHEST 1 VIEW COMPARISON:  01/20/2016. FINDINGS: Tracheostomy tube noted in stable position. PICC line noted with tip projected over superior  vena cava. Heart size normal. Unchanged mild left lower lobe infiltrate periods unchanged small bilateral pleural effusions. IMPRESSION: 1. Tracheostomy tube and right PICC line stable position. 2. Unchanged left lower lobe infiltrate and small bilateral pleural effusions. Electronically Signed   By: Maisie Fus  Register   On: 01/21/2016 11:53   Prior to Admission medications   Medication Sig Start Date End Date Taking? Authorizing Provider  aluminum-magnesium hydroxide-simethicone (MAALOX) 200-200-20 MG/5ML SUSP Take 30 mLs by mouth every 6 (six) hours as needed.   Yes Historical Provider, MD  Amino Ac Elect-Calc in D10W (CLINIMIX E/DEXTROSE, 2.75/10,) 2.75 % SOLN Inject 2,160 mLs into the vein as needed.   Yes Historical Provider, MD  aspirin 81 MG chewable tablet Place 81 mg into feeding tube every evening.   Yes Historical Provider, MD  atorvastatin (LIPITOR) 40 MG tablet Place 40 mg into feeding tube at bedtime.    Yes Historical Provider, MD  bisacodyl (DULCOLAX) 10 MG suppository Place 10 mg rectally daily as needed for moderate constipation or severe constipation.   Yes Historical Provider, MD  bismuth subsalicylate (PEPTO BISMOL) 262 MG/15ML suspension Place 30 mLs into feeding tube every 8 (eight) hours as needed for indigestion or diarrhea or loose stools.   Yes Historical Provider, MD  calcium-vitamin D (OSCAL WITH D) 500-200 MG-UNIT tablet Place 1 tablet into feeding tube daily with breakfast.    Yes Historical  Provider, MD  Cyanocobalamin (VITAMIN B-12) 1000 MCG SUBL Place 1 tablet under the tongue daily with breakfast.   Yes Historical Provider, MD  Ferrous Sulfate 220 (44 Fe) MG/5ML LIQD Place 7.5 mLs into feeding tube at bedtime.    Yes Historical Provider, MD  FLUoxetine (PROZAC) 20 MG/5ML solution Place 20 mg into feeding tube daily.   Yes Historical Provider, MD  haloperidol (HALDOL) 0.5 MG tablet Take 0.25-0.5 mg by mouth 3 (three) times daily. At 6 am and 4 PM and 9 PM   Yes  Historical Provider, MD  haloperidol (HALDOL) 0.5 MG tablet Place 0.5 mg into feeding tube as needed for agitation. STOP DATE 01/24/2016 01/10/16 01/24/16 Yes Historical Provider, MD  loperamide (IMODIUM) 1 MG/5ML solution 10 mg. Take 10 mls via tube every 8 hours as needed for more than 2 bms every 8 hours.   Yes Historical Provider, MD  magic mouthwash SOLN Take 15 mLs by mouth 4 (four) times daily as needed for mouth pain (sore throat). 12/30/15  Yes Karie SodaSteven Gross, MD  metoCLOPramide (REGLAN) 5 MG/5ML solution Place 10 mg into feeding tube every 6 (six) hours as needed for nausea or vomiting.   Yes Historical Provider, MD  metoprolol tartrate (LOPRESSOR) 25 MG tablet Place 25 mg into feeding tube daily.   Yes Historical Provider, MD  nicotine (NICODERM CQ - DOSED IN MG/24 HR) 7 mg/24hr patch Place 7 mg onto the skin daily. Rotate site   Yes Historical Provider, MD  nitroGLYCERIN (NITROSTAT) 0.4 MG SL tablet Place 0.4 mg under the tongue every 5 (five) minutes as needed for chest pain.   Yes Historical Provider, MD  Nutritional Supplements (FEEDING SUPPLEMENT, OSMOLITE 1.2 CAL,) LIQD Place 237 mLs into feeding tube. Give 1 can via G-tube every day at  10 am and  6 pm.   Yes Historical Provider, MD  omeprazole (PRILOSEC) 40 MG capsule Take 40 mg by mouth 2 (two) times daily.   Yes Historical Provider, MD  Polyvinyl Alcohol-Povidone (REFRESH OP) Place 2 drops into both eyes as needed (For dry eyes.).    Yes Historical Provider, MD  potassium chloride (KCL) 2 mEq/mL SOLN oral liquid Take 30 mEq by mouth daily.   Yes Historical Provider, MD  RESTASIS 0.05 % ophthalmic emulsion Place 1 drop into both eyes 2 (two) times daily.  04/16/13  Yes Historical Provider, MD  traZODone (DESYREL) 50 MG tablet Place 100 mg into feeding tube at bedtime.    Yes Historical Provider, MD  Water For Irrigation, Sterile (STERILE WATER FOR IRRIGATION) Irrigate with 250 mLs as directed every 6 (six) hours.    Historical Provider,  MD     Medications: . bisacodyl  10 mg Rectal Daily  . heparin  5,000 Units Subcutaneous Q8H  . insulin aspart  0-9 Units Subcutaneous Q6H  . lip balm  1 application Topical BID  . liver oil-zinc oxide   Topical BID  . pantoprazole (PROTONIX) IV  40 mg Intravenous Q12H   . [name].TPN (CLINIMIX-E) Adult 83 mL/hr at 01/23/16 0600   And  . fat emulsion 240 mL (01/23/16 0600)  . [name].TPN (CLINIMIX-E) Adult     And  . fat emulsion     CAD with stents [id number] - DR. H Smith  Assessment/Plan PSBO with 2 days of nausea and vomiting on admit 01/20/16 S/P Robotic anterior and posterior vagotomy, BI anastomosis, DOR fundoplication, omentopexy for partial GOO 09/2015, DR. Viviann SpareSTEVEN GROSS  Feeding Jejunostomy and gastrostomy/EGD balloon dilatation, secondary to edema  at anastomosis, 12/01/15, DR. Karie SodaSteven Gross S/p diagnostic laparoscopy, omentopexy of jejunal disruption, and washout for jejunal disruption (pt pulled out jejunostomy tube) and gastric leak. 12/03/15, Dr. Karie SodaSteven Gross (pt pulled out jejunostomy tube) Malnutrition formerly on TF/now on TNA FEN:  NPO/TNA ID:  No abx for 48 hours DVT: Heparin/SCD    Plan: Cont current management.  Swallow eval on Mon.  If no aspiration, will start to advance diet.  Cont TPN     LOS: 3 days    Romie LeveeHOMAS, Alba Perillo C. 01/23/2016 [phone]

## 2016-01-23 NOTE — Progress Notes (Signed)
OT Cancellation Note  Patient Details Name: [name] MRN: [id number] DOB: 1940-10-12   Cancelled Treatment:    Reason Eval/Treat Not Completed: Fatigue/lethargy limiting ability to participate  Pt had just gotten back to bed- will check on pt next day Lise AuerLori Artrell Lawless, [place] [phone] Einar CrowEDDING, Clytee Heinrich D 01/23/2016, 3:25 PM

## 2016-01-24 LAB — GLUCOSE, CAPILLARY
GLUCOSE-CAPILLARY: 122 mg/dL — AB (ref 65–99)
GLUCOSE-CAPILLARY: 121 mg/dL — AB (ref 65–99)
Glucose-Capillary: 113 mg/dL — ABNORMAL HIGH (ref 65–99)
Glucose-Capillary: 118 mg/dL — ABNORMAL HIGH (ref 65–99)

## 2016-01-24 MED ORDER — M.V.I. ADULT IV INJ
INJECTION | INTRAVENOUS | Status: AC
  Administered 2016-01-24: 17:00:00 via INTRAVENOUS
  Filled 2016-01-24: qty 1992

## 2016-01-24 MED ORDER — CYCLOSPORINE 0.05 % OP EMUL
1.0000 [drp] | Freq: Two times a day (BID) | OPHTHALMIC | Status: DC
  Administered 2016-01-24 – 2016-01-31 (×15): 1 [drp] via OPHTHALMIC
  Administered 2016-01-31: 11:00:00 via OPHTHALMIC
  Administered 2016-02-01 – 2016-02-26 (×52): 1 [drp] via OPHTHALMIC
  Filled 2016-01-24 (×73): qty 1

## 2016-01-24 MED ORDER — FAT EMULSION 20 % IV EMUL
240.0000 mL | INTRAVENOUS | Status: AC
  Administered 2016-01-24: 240 mL via INTRAVENOUS
  Filled 2016-01-24: qty 250

## 2016-01-24 MED ORDER — NICOTINE 7 MG/24HR TD PT24
7.0000 mg | MEDICATED_PATCH | Freq: Every day | TRANSDERMAL | Status: DC
  Administered 2016-01-24 – 2016-02-26 (×33): 7 mg via TRANSDERMAL
  Filled 2016-01-24 (×34): qty 1

## 2016-01-24 NOTE — Progress Notes (Signed)
Subjective: Seems to be feeling ok   Objective: Vital signs in last 24 hours: Temp:  [97.6 F (36.4 C)-98 F (36.7 C)] 98 F (36.7 C) (12/10 04540633) Pulse Rate:  [78-90] 90 (12/10 0900) Resp:  [16-18] 17 (12/10 0900) BP: (139-148)/(43-58) 148/57 (12/10 0633) SpO2:  [95 %-100 %] 98 % (12/10 0900) FiO2 (%):  [28 %] 28 % (12/10 0900) Last BM Date: 01/22/16   Intake/Output from previous day: 12/09 0701 - 12/10 0700 In: 1528 [I.V.:1528] Out: 1975 [Urine:1975] Intake/Output this shift: No intake/output data recorded.  General appearance: alert, cooperative, no distress and comfortable on trach GI: soft, no distension  Lab Results:  No results for input(s): WBC, HGB, HCT, PLT in the last 72 hours.  BMET  Recent Labs  01/22/16 0511  NA 133*  K 3.6  CL 102  CO2 28  GLUCOSE 125*  BUN 19  CREATININE 0.62  CALCIUM 7.5*   PT/INR No results for input(s): LABPROT, INR in the last 72 hours.   Recent Labs Lab 01/20/16 0158 01/21/16 0437 01/22/16 0511  AST 21 17 16   ALT 14* 12* 11*  ALKPHOS 111 109 94  BILITOT 0.5 0.5 0.4  PROT 5.6* 5.3* 4.9*  ALBUMIN 1.6* 1.5* 1.4*     Lipase     Component Value Date/Time   LIPASE 55 (H) 01/20/2016 0158     Studies/Results: No results found. Prior to Admission medications   Medication Sig Start Date End Date Taking? Authorizing Provider  aluminum-magnesium hydroxide-simethicone (MAALOX) 200-200-20 MG/5ML SUSP Take 30 mLs by mouth every 6 (six) hours as needed.   Yes Historical Provider, MD  Amino Ac Elect-Calc in D10W (CLINIMIX E/DEXTROSE, 2.75/10,) 2.75 % SOLN Inject 2,160 mLs into the vein as needed.   Yes Historical Provider, MD  aspirin 81 MG chewable tablet Place 81 mg into feeding tube every evening.   Yes Historical Provider, MD  atorvastatin (LIPITOR) 40 MG tablet Place 40 mg into feeding tube at bedtime.    Yes Historical Provider, MD  bisacodyl (DULCOLAX) 10 MG suppository Place 10 mg rectally daily as needed for  moderate constipation or severe constipation.   Yes Historical Provider, MD  bismuth subsalicylate (PEPTO BISMOL) 262 MG/15ML suspension Place 30 mLs into feeding tube every 8 (eight) hours as needed for indigestion or diarrhea or loose stools.   Yes Historical Provider, MD  calcium-vitamin D (OSCAL WITH D) 500-200 MG-UNIT tablet Place 1 tablet into feeding tube daily with breakfast.    Yes Historical Provider, MD  Cyanocobalamin (VITAMIN B-12) 1000 MCG SUBL Place 1 tablet under the tongue daily with breakfast.   Yes Historical Provider, MD  Ferrous Sulfate 220 (44 Fe) MG/5ML LIQD Place 7.5 mLs into feeding tube at bedtime.    Yes Historical Provider, MD  FLUoxetine (PROZAC) 20 MG/5ML solution Place 20 mg into feeding tube daily.   Yes Historical Provider, MD  haloperidol (HALDOL) 0.5 MG tablet Take 0.25-0.5 mg by mouth 3 (three) times daily. At 6 am and 4 PM and 9 PM   Yes Historical Provider, MD  haloperidol (HALDOL) 0.5 MG tablet Place 0.5 mg into feeding tube as needed for agitation. STOP DATE 01/24/2016 01/10/16 01/24/16 Yes Historical Provider, MD  loperamide (IMODIUM) 1 MG/5ML solution 10 mg. Take 10 mls via tube every 8 hours as needed for more than 2 bms every 8 hours.   Yes Historical Provider, MD  magic mouthwash SOLN Take 15 mLs by mouth 4 (four) times daily as needed for mouth pain (sore  throat). 12/30/15  Yes Karie SodaSteven Gross, MD  metoCLOPramide (REGLAN) 5 MG/5ML solution Place 10 mg into feeding tube every 6 (six) hours as needed for nausea or vomiting.   Yes Historical Provider, MD  metoprolol tartrate (LOPRESSOR) 25 MG tablet Place 25 mg into feeding tube daily.   Yes Historical Provider, MD  nicotine (NICODERM CQ - DOSED IN MG/24 HR) 7 mg/24hr patch Place 7 mg onto the skin daily. Rotate site   Yes Historical Provider, MD  nitroGLYCERIN (NITROSTAT) 0.4 MG SL tablet Place 0.4 mg under the tongue every 5 (five) minutes as needed for chest pain.   Yes Historical Provider, MD  Nutritional  Supplements (FEEDING SUPPLEMENT, OSMOLITE 1.2 CAL,) LIQD Place 237 mLs into feeding tube. Give 1 can via G-tube every day at  10 am and  6 pm.   Yes Historical Provider, MD  omeprazole (PRILOSEC) 40 MG capsule Take 40 mg by mouth 2 (two) times daily.   Yes Historical Provider, MD  Polyvinyl Alcohol-Povidone (REFRESH OP) Place 2 drops into both eyes as needed (For dry eyes.).    Yes Historical Provider, MD  potassium chloride (KCL) 2 mEq/mL SOLN oral liquid Take 30 mEq by mouth daily.   Yes Historical Provider, MD  RESTASIS 0.05 % ophthalmic emulsion Place 1 drop into both eyes 2 (two) times daily.  04/16/13  Yes Historical Provider, MD  traZODone (DESYREL) 50 MG tablet Place 100 mg into feeding tube at bedtime.    Yes Historical Provider, MD  Water For Irrigation, Sterile (STERILE WATER FOR IRRIGATION) Irrigate with 250 mLs as directed every 6 (six) hours.    Historical Provider, MD     Medications: . bisacodyl  10 mg Rectal Daily  . heparin  5,000 Units Subcutaneous Q8H  . insulin aspart  0-9 Units Subcutaneous Q6H  . lip balm  1 application Topical BID  . liver oil-zinc oxide   Topical BID  . pantoprazole (PROTONIX) IV  40 mg Intravenous Q12H   . [name].TPN (CLINIMIX-E) Adult 83 mL/hr at 01/23/16 1710   And  . fat emulsion 240 mL (01/23/16 1710)   CAD with stents [id number] - DR. H Smith  Assessment/Plan PSBO with 2 days of nausea and vomiting on admit 01/20/16 S/P Robotic anterior and posterior vagotomy, BI anastomosis, DOR fundoplication, omentopexy for partial GOO 09/2015, DR. Viviann SpareSTEVEN GROSS  Feeding Jejunostomy and gastrostomy/EGD balloon dilatation, secondary to edema at anastomosis, 12/01/15, DR. Karie SodaSteven Gross S/p diagnostic laparoscopy, omentopexy of jejunal disruption, and washout for jejunal disruption (pt pulled out jejunostomy tube) and gastric leak. 12/03/15, Dr. Karie SodaSteven Gross (pt pulled out jejunostomy tube) Malnutrition formerly on TF/now on TNA FEN:  NPO/TNA ID:  No abx  currently DVT: Heparin/SCD    Plan: Cont current management.  Swallow eval on Mon.  If no aspiration, will start to advance diet.  Cont TPN     LOS: 4 days    Jhordan Mckibben C. 01/24/2016

## 2016-01-24 NOTE — Progress Notes (Signed)
[name].PHARMACY - ADULT TOTAL PARENTERAL NUTRITION CONSULT NOTE   Pharmacy Consult for TPN Indication: PTA TPN continuation, intolerance to enteral feed  Insulin Requirements: 1 units /24 h   Current Nutrition: [facility] home [facility] pt received 405 grams (1376 kcal)  of dextrose, 75 grams (300 kcal) of protein and 50 grams ( 480 kcal) of lipid for a total of 2156 kcal/day  IVF: none   Central access: PICC TPN start date: PTA, restarted on admission on 12/6   ASSESSMENT                                                                                                          HPI: [age] yoM with hx PUD, CAD, GERD, BPH,  s/p multiple procedures  and extended hospitalization for recurrent partial GOO 2/2 pyloric stricture on TPN for several days, discharged 11/15 with J/G tube in place on TFs, unable to tolerate and had [facility], readmitted 12/6 with partial SBO. Pharmacy consulted to resume TPN.  Significant events:  12/6: TPN continued on admission. [facility]: Pt pulled out NGT, not replaced, remains NPO 12/8: trial of ice chips 12/10: continue TPN, plan swallow study Monday 12/11, advance diet if can tolerate  Today:   Glucose - No hx DM noted, CBGs at goal - SSI q6h  Electrolytes - (12/8) Na low, small improvement, Mg now WNL, CorrCa, K, Phos WNL  Renal - SCr WNL  LFTs - WNL  TGs - 177 ( 11/27), 93 (12/7)  Prealbumin - 15.3 ( 11/13), 10.8 (12/7)  NUTRITIONAL GOALS                                                                                             RD recs (12/7): Kcal 2000-2200, Protein 100-115g, Fluid 2L/day  [facility]: 2156 Kcal/day, Protein 75g/day  Clinimix E 5/15 at a goal rate of 83 ml/hr + 20% fat emulsion at  10 ml/hr to provide: 100g/day protein, 1894 Kcal/day.  **There is currently a Acupuncturistnational backorder of Clinimix solution. To conserve supply, will keep goal rate of Clinimix 5/15 at  83 ml/hr + 20% fat emulsion at 4910ml/hr  PLAN  At 1800 today:  Continue Clinimix E 5/15 at 83 ml/hr.  20% fat emulsion at 10 ml/hr.  TPN to contain standard multivitamins and trace elements.  Continue sensitive SSI coverage q6h .   TPN lab panels on Mondays & Thursdays  F/u daily  Otho BellowsGreen, Wilma Wuthrich L PharmD Pager [phone] 01/24/2016, 10:16 AM

## 2016-01-24 NOTE — Progress Notes (Signed)
Physical Therapy Treatment Patient Details Name: [name] MRN: [id number] DOB: Jun 09, 1940 Today's Date: 01/24/2016    [facility] r/o ileus    PT Comments    Pt in bed "looking good".  Pt very familiar to me and appears less edematous and more sprite.  On Trach Collar but only 5 lts at 28%.  [facility] to small amount loose stools upon standing.  Assisted with hygiene then amb twice using B platform walker + 2 assist for safety (equipment) and 3rd assist (spouse) following with recliner as pt demonstrates some anxiety/fear of falling/fear of knees giving was from fatigue.  O2 sats avg 98% and HR increased from 76 to 119.   Follow Up Recommendations  SNF     Equipment Recommendations  None recommended by PT    Recommendations for Other Services       Precautions / Restrictions Precautions Precautions: Fall Precaution Comments: Gastric tube, trach collar      5 lts 28% Restrictions Weight Bearing Restrictions: No    Mobility  Bed Mobility Overal bed mobility: Needs Assistance Bed Mobility: Supine to Sit     Supine to sit: Supervision;Min guard     General bed mobility comments: assist with lines/tube only  Transfers Overall transfer level: Needs assistance Equipment used: None Transfers: Sit to/from Stand Sit to Stand: Min guard;Min assist Stand pivot transfers: Min guard;Min assist       General transfer comment: 50% VC's to push self up from bed/BSC/recliner vs pull up on walker.  Able to self rise but requires direction for safety/turn completion/backward gait and 75% VC's to reach back prior to sit as pt tends to "plop" due to fatigue.  Ambulation/Gait Ambulation/Gait assistance: Min assist;Mod assist Ambulation Distance (Feet): 64 Feet (48 feet, one sitting rest break then another 16 feet) Assistive device: Bilateral platform walker Gait Pattern/deviations: Step-through pattern;Decreased  stride length;Trunk flexed Gait velocity: decreased   General Gait Details: used B plaform EVA walker for increased support as [facility]. Pt on 5 lts 28% Trach Collar with avg sats 98% and HR 109 with activity.  Tolerated distance well concidering he has not walked in a while.   Spouse assisted by following with recliner as pt demonstartes anxiety/fear of falling and or knees giving way.     Stairs            Wheelchair Mobility    Modified Rankin (Stroke Patients Only)       Balance                                    Cognition Arousal/Alertness: Awake/alert Behavior During Therapy: WFL for tasks assessed/performed Overall Cognitive Status: Within Functional Limits for tasks assessed                      Exercises      General Comments        Pertinent Vitals/Pain Pain Assessment: No/denies pain    Home Living                      Prior Function            PT Goals (current goals can now be found in the care plan section) Progress towards PT goals: Progressing toward goals  Frequency    Min 3X/week      PT Plan Current plan remains appropriate    Co-evaluation             End of Session Equipment Utilized During Treatment: Oxygen;Gait belt Activity Tolerance: Patient limited by fatigue Patient left: in chair;with call bell/phone within reach;with family/visitor present     Time: [id number] PT Time Calculation (min) (ACUTE ONLY): 24 min  Charges:  $Gait Training: 8-22 mins $Therapeutic Activity: 8-22 mins                    G Codes:      Felecia ShellingLori Keyandre Pileggi  PTA [facility]  Acute  Rehab Pager      [phone]

## 2016-01-24 NOTE — Progress Notes (Signed)
Nutrition Brief Follow-up  TPN continues: Running at goal regimen of Clinimix E 5/15 @ 83 ml/hr + 20% ILE @ 10 ml/hr. This regimen provides 1992 ml fluid, 1897 kcal (95% minimum estimated kcal needs), 100 grams protein daily (100% minimum estimated protein needs).  Noted MBS scheduled for 12/11. Will follow-up post swallow study results.  If nutrition issues arise, please consult RD.   Tilda FrancoLindsey Kirtan Sada, MS, RD, LDN Pager: [phone] After Hours Pager: [phone]

## 2016-01-25 ENCOUNTER — Inpatient Hospital Stay (HOSPITAL_COMMUNITY): Payer: Medicare Other

## 2016-01-25 DIAGNOSIS — J690 Pneumonitis due to inhalation of food and vomit: Secondary | ICD-10-CM

## 2016-01-25 DIAGNOSIS — F411 Generalized anxiety disorder: Secondary | ICD-10-CM

## 2016-01-25 LAB — GLUCOSE, CAPILLARY
GLUCOSE-CAPILLARY: 119 mg/dL — AB (ref 65–99)
Glucose-Capillary: 137 mg/dL — ABNORMAL HIGH (ref 65–99)
Glucose-Capillary: 116 mg/dL — ABNORMAL HIGH (ref 65–99)
Glucose-Capillary: 118 mg/dL — ABNORMAL HIGH (ref 65–99)

## 2016-01-25 LAB — TRIGLYCERIDES: Triglycerides: 119 mg/dL (ref <150)

## 2016-01-25 LAB — CBC
HEMATOCRIT: 23.4 % — AB (ref 39.0–52.0)
HEMOGLOBIN: 7.8 g/dL — AB (ref 13.0–17.0)
MCH: 29.3 pg (ref 26.0–34.0)
MCHC: 33.3 g/dL (ref 30.0–36.0)
MCV: 88 fL (ref 78.0–100.0)
Platelets: 369 10*3/uL (ref 150–400)
RBC: 2.66 MIL/uL — ABNORMAL LOW (ref 4.22–5.81)
RDW: 16.9 % — AB (ref 11.5–15.5)
WBC: 7.7 10*3/uL (ref 4.0–10.5)

## 2016-01-25 LAB — DIFFERENTIAL
BASOS ABS: 0.1 10*3/uL (ref 0.0–0.1)
BASOS PCT: 1 %
Eosinophils Absolute: 0.1 10*3/uL (ref 0.0–0.7)
Eosinophils Relative: 1 %
LYMPHS ABS: 4 10*3/uL (ref 0.7–4.0)
Lymphocytes Relative: 51 %
MONOS PCT: 14 %
Monocytes Absolute: 1 10*3/uL (ref 0.1–1.0)
NEUTROS ABS: 2.5 10*3/uL (ref 1.7–7.7)
NEUTROS PCT: 33 %

## 2016-01-25 LAB — COMPREHENSIVE METABOLIC PANEL
ALT: 12 U/L — ABNORMAL LOW (ref 17–63)
ANION GAP: 5 (ref 5–15)
AST: 17 U/L (ref 15–41)
Albumin: 1.5 g/dL — ABNORMAL LOW (ref 3.5–5.0)
Alkaline Phosphatase: 106 U/L (ref 38–126)
BUN: 23 mg/dL — ABNORMAL HIGH (ref 6–20)
CHLORIDE: 99 mmol/L — AB (ref 101–111)
CO2: 27 mmol/L (ref 22–32)
Calcium: 7.9 mg/dL — ABNORMAL LOW (ref 8.9–10.3)
Creatinine, Ser: 0.58 mg/dL — ABNORMAL LOW (ref 0.61–1.24)
GFR calc non Af Amer: >60 mL/min (ref >60)
Glucose, Bld: 104 mg/dL — ABNORMAL HIGH (ref 65–99)
Potassium: 3.9 mmol/L (ref 3.5–5.1)
SODIUM: 131 mmol/L — AB (ref 135–145)
Total Bilirubin: 0.5 mg/dL (ref 0.3–1.2)
Total Protein: 5.2 g/dL — ABNORMAL LOW (ref 6.5–8.1)

## 2016-01-25 LAB — MAGNESIUM: Magnesium: 1.6 mg/dL — ABNORMAL LOW (ref 1.7–2.4)

## 2016-01-25 LAB — PHOSPHORUS: PHOSPHORUS: 4.1 mg/dL (ref 2.5–4.6)

## 2016-01-25 LAB — PREALBUMIN: PREALBUMIN: 12.8 mg/dL — AB (ref 18–38)

## 2016-01-25 MED ORDER — MAGNESIUM SULFATE 2 GM/50ML IV SOLN
2.0000 g | Freq: Once | INTRAVENOUS | Status: AC
  Administered 2016-01-25: 2 g via INTRAVENOUS
  Filled 2016-01-25: qty 50

## 2016-01-25 MED ORDER — OSMOLITE 1.2 CAL PO LIQD
237.0000 mL | Freq: Two times a day (BID) | ORAL | Status: DC
  Administered 2016-01-25: 237 mL
  Filled 2016-01-25 (×2): qty 237

## 2016-01-25 MED ORDER — FAT EMULSION 20 % IV EMUL
240.0000 mL | INTRAVENOUS | Status: AC
  Administered 2016-01-25: 240 mL via INTRAVENOUS
  Filled 2016-01-25: qty 250

## 2016-01-25 MED ORDER — TRACE MINERALS CR-CU-MN-SE-ZN 10-1000-500-60 MCG/ML IV SOLN
INTRAVENOUS | Status: AC
  Administered 2016-01-25: 17:00:00 via INTRAVENOUS
  Filled 2016-01-25: qty 1992

## 2016-01-25 NOTE — Progress Notes (Signed)
Pt is sleeping comfortably at this time no distress or complications noted.  

## 2016-01-25 NOTE — Progress Notes (Signed)
Brief progress:  Swallow eval did not go well - recommending NPO except ice chips and limited sips of water with chin tuck. Will re-start tube feeds with Osmolite formula. Continue TPN for now. CCM capped tracheostomy tube - if patient tolerated well then trach will be discontinued in 24-48 h.   Will try to come back and discuss with patients wife around 4:00pm - family not in the room when I came by to update the patient .   [name], PA-C [facility] Pager: [phone] Consults: [phone] Mon-Fri 7:00 am-4:30 pm Sat-Sun 7:00 am-11:30 am

## 2016-01-25 NOTE — Progress Notes (Signed)
RT has ordered Cap for trach. PT currently has trach with disposable inner cannula that will require a cap. [facility]. RN aware.

## 2016-01-25 NOTE — Progress Notes (Signed)
PT Cancellation Note  Patient Details Name: Danny GalRalph T [name] MRN: [id number] DOB: 1940-09-13   Cancelled Treatment:    Reason Eval/Treat Not Completed: Patient declined, no reason specified Pt reports not feeling well and upset about results of swallow evaluation earlier.  Pt aware PT will check back tomorrow and assist with mobilizing.   Chelcy Bolda,KATHrine E 01/25/2016, 3:05 PM Zenovia JarredKati Carolena Fairbank, PT, DPT 01/25/2016 Pager: [phone]

## 2016-01-25 NOTE — Evaluation (Signed)
Occupational Therapy Evaluation Patient Details Name: Danny GalRalph T Luebbe MRN: [id number] DOB: 09-Feb-1941 Today's Date: 01/25/2016    History of Present Illness re admit [facility] Dx N/V r/o ileus   Clinical Impression   Pt admitted with N/V from SNF.  Pt currently with functional limitations due to the deficits listed below (see OT Problem List).  Pt will benefit from skilled OT to increase their safety and independence with ADL and functional mobility for ADL to facilitate discharge to venue listed below.      Follow Up Recommendations  SNF          Precautions / Restrictions Precautions Precautions: Fall Precaution Comments: Gastric tube, trach collar Restrictions Weight Bearing Restrictions: No      Mobility Bed Mobility Overal bed mobility: Needs Assistance Bed Mobility: Supine to Sit     Supine to sit: Supervision;Min guard     General bed mobility comments: assist with lines/tube only  Transfers Overall transfer level: Needs assistance Equipment used: None Transfers: Sit to/from UGI CorporationStand;Stand Pivot Transfers Sit to Stand: Mod assist;+2 physical assistance Stand pivot transfers: Mod assist;+2 physical assistance                 ADL Overall ADL's : Needs assistance/impaired     Grooming: Brushing hair;Moderate assistance;Cueing for safety;Cueing for sequencing;Sitting;Wash/dry hands;Wash/dry Scientist, research (physical sciences)face                   Toilet Transfer: Maximal assistance;Cueing for safety;Cueing for sequencing;Squat-pivot;Stand-pivot;+2 for physical assistance Toilet Transfer Details (indicate cue type and reason): bed to chair                           Pertinent Vitals/Pain Pain Assessment: No/denies pain Pain Score: 0-No pain           Communication Communication Communication: Tracheostomy;Passy-Muir valve   Cognition Arousal/Alertness: Awake/alert Behavior During Therapy: WFL for tasks assessed/performed Overall Cognitive Status:  Within Functional Limits for tasks assessed                                Home Living Family/patient expects to be discharged to:: Private residence Living Arrangements: Spouse/significant other Available Help at Discharge: Family Type of Home: House Home Access: Stairs to enter Secretary/administratorntrance Stairs-Number of Steps: 3 Entrance Stairs-Rails: Left;Right Home Layout: One level     Bathroom Shower/Tub: Producer, television/film/videoWalk-in shower   Bathroom Toilet: Handicapped height     Home Equipment: None   Additional Comments: prior to last admission in October, pt discharged to SNF last admission and admitted from SNF               OT Problem List: Decreased strength;Decreased activity tolerance;Impaired balance (sitting and/or standing);Decreased knowledge of use of DME or AE;Pain;Cardiopulmonary status limiting activity   OT Treatment/Interventions: Self-care/ADL training;DME and/or AE instruction;Energy conservation;Therapeutic activities;Patient/family education;Balance training    OT Goals(Current goals can be found in the care plan section) Acute Rehab OT Goals Patient Stated Goal: to get home OT Goal Formulation: With patient Time For Goal Achievement: 02/08/16 Potential to Achieve Goals: Good  OT Frequency: Min 2X/week              End of Session Nurse Communication: Mobility status  Activity Tolerance: Patient tolerated treatment well Patient left: in chair;with call bell/phone within reach;with nursing/sitter in room;with family/visitor present   Time: [id number] OT Time Calculation (min): 24 min Charges:  OT General  Charges $OT Visit: 1 Procedure OT Evaluation $OT Eval Moderate Complexity: 1 Procedure OT Treatments $Self Care/Home Management : 8-22 mins G-Codes:    Einar CrowEDDING, Keithen Capo D 01/25/2016, 11:53 AM

## 2016-01-25 NOTE — Progress Notes (Signed)
[facility]  Subjective: Feels well Coughs mucus up daily No dyspnea  Objective: Vitals:   01/25/16 0058 01/25/16 0457 01/25/16 0547 01/25/16 0746  BP:   (!) 141/63   Pulse: 85 86 88   Resp: 16 18 18    Temp:   98 F (36.7 C)   TempSrc:   Oral   SpO2: 98% 94% 98% 97%  Weight:      Height:       28% trach collar  On exam: Awake, alert, sitting quietly in bed Tracheostomy in place, no drainage nearby, speaking valve in place Lungs clear to auscultation with normal effort, occasional cough Cardiovascular regular rate and rhythm no murmurs gallops or rubs Neurologic: Awake, alert, no distress  01/21/2016 chest x-ray with very mild left lower lobe airspace disease  Impression: Tracheostomy status Small bowel obstruction  Discussion: Mr. [name] is well-known to our service from a recent ICU stay where he had a tracheostomy. He's been doing well at home with some cough and mucus production. He's been stable overall from a respiratory standpoint though on today's modified barium swallow he was noted to still have some degree of pharyngeal phase aspiration. The tracheostomy is likely not helping in regards to his aspiration. Given his normal ventilatory mechanics in oxygenation it's reasonable to consider decannulation. Prior to cannulating him I would like to carry out a capping trial for 24 hours.  Plan: Tracheostomy for 24 hours If stable then decannulate Continue swallowing evaluation and management per primary service and speech therapy  Heber CarolinaBrent McQuaid, MD [place] [facility] Pager: [phone] Cell: [phone] After 3pm or if no response, call [phone]

## 2016-01-25 NOTE — Progress Notes (Signed)
[name]PHARMACY - ADULT TOTAL PARENTERAL NUTRITION CONSULT NOTE   Pharmacy Consult for TPN Indication: PTA TPN continuation, intolerance to enteral feed  Insulin Requirements: 3 units /24 h   Current Nutrition: PTA TPN from advanced home care [facility] pt received 405 grams (1376 kcal)  of dextrose, 75 grams (300 kcal) of protein and 50 grams ( 480 kcal) of lipid for a total of 2156 kcal/day  IVF: none   Central access: PICC TPN start date: PTA, restarted on admission on 12/6   ASSESSMENT                                                                                                          HPI: [age] yoM with hx PUD, CAD, GERD, BPH,  s/p multiple procedures  and extended hospitalization for recurrent partial GOO 2/2 pyloric stricture on TPN for several days, discharged 11/15 with J/G tube in place on TFs, unable to tolerate and [facility], readmitted 12/6 with partial SBO. Pharmacy consulted to resume TPN.  Significant events:  12/6: TPN continued on admission. [facility] 12/7: Pt pulled out NGT, not replaced, remains NPO 12/8: trial of ice chips 12/10: continue TPN, plan swallow study Monday 12/11, if no aspiration, will start to advance diet.   12/11: Awaiting MBS.  Having bowel function.  Continue TPN for now.    Today:   Glucose - No hx DM noted, CBGs at goal - SSI q6h  Electrolytes - Na remains low, stable, Mg decreased, low, CorrCa, K, Phos WNL  Renal - SCr WNL  LFTs - WNL  TGs - 177 ( 11/27), 93 (12/7), 119 (12/11)  Prealbumin - 15.3 ( 11/13), 10.8 (12/7), 12.8 (12/11)  NUTRITIONAL GOALS                                                                                             RD recs (12/7): Kcal 2000-2200, Protein 100-115g, Fluid 2L/day  [facility]: 2156 Kcal/day, Protein 75g/day  Clinimix E 5/15 at a goal rate of 83 ml/hr + 20% fat emulsion at  10 ml/hr to provide: 100g/day protein, 1894 Kcal/day.  **There is  currently a Acupuncturistnational backorder of Clinimix solution. To conserve supply, will keep goal rate of Clinimix 5/15 at 83 ml/hr + 20% fat emulsion at 8110ml/hr  PLAN  Now: Magnesium 2g IV x1  At 1800 today:  Continue Clinimix E 5/15 at 83 ml/hr.  20% fat emulsion at 10 ml/hr.  TPN to contain standard multivitamins and trace elements.  Continue sensitive SSI coverage q6h .   TPN lab panels on Mondays & Thursdays  Recheck CMET, Mg in AM  F/u results of MBS, diet advancement  F/u daily  Haynes Hoehnolleen Jada Fass, PharmD, BCPS 01/25/2016, 11:18 AM  Pager: [phone]

## 2016-01-25 NOTE — Care Management Important Message (Signed)
Important Message  Patient Details  Name: [name] MRN: [id number] Date of Birth: 10-21-1940   Medicare Important Message Given:  Yes    Haskell FlirtJamison, Olympia Adelsberger 01/25/2016, 2:49 PMImportant Message  Patient Details  Name: [name] MRN: [id number] Date of Birth: 10-21-1940   Medicare Important Message Given:  Yes    Haskell FlirtJamison, Berlyn Saylor 01/25/2016, 2:49 PM

## 2016-01-25 NOTE — Progress Notes (Signed)
[facility] Progress Note     Subjective: Wife at bedside. C/o back pain and requesting to get up to chair. Having bowel function. Working with therapies.  Objective: Vital signs in last 24 hours: Temp:  [97.9 F (36.6 C)-98.3 F (36.8 C)] 98 F (36.7 C) (12/11 0547) Pulse Rate:  [85-90] 88 (12/11 0547) Resp:  [16-18] 18 (12/11 0547) BP: (118-142)/(57-69) 141/63 (12/11 0547) SpO2:  [94 %-100 %] 97 % (12/11 0746) FiO2 (%):  [28 %] 28 % (12/11 0746) Last BM Date: 01/22/16  Intake/Output from previous day: 12/10 0701 - 12/11 0700 In: 1043 [I.V.:1043] Out: 1220 [Urine:1100; Drains:120] Intake/Output this shift: Total I/O In: 0  Out: 250 [Urine:250]  PE: Gen:  Alert, NAD, pleasant and cooperative HEENT: tracheostomy tube in place  Pulm: unlabored  Abd: Soft, NT/ND, +BS, g-tube site c/d/i Ext:  No erythema or tenderness  Lab Results:   Recent Labs  01/25/16 0412  WBC 7.7  HGB 7.8*  HCT 23.4*  PLT 369   BMET  Recent Labs  01/25/16 0412  NA 131*  K 3.9  CL 99*  CO2 27  GLUCOSE 104*  BUN 23*  CREATININE 0.58*  CALCIUM 7.9*   PT/INR No results for input(s): LABPROT, INR in the last 72 hours. CMP     Component Value Date/Time   NA 131 (L) 01/25/2016 0412   NA 139 01/14/2016   K 3.9 01/25/2016 0412   CL 99 (L) 01/25/2016 0412   CO2 27 01/25/2016 0412   GLUCOSE 104 (H) 01/25/2016 0412   BUN 23 (H) 01/25/2016 0412   BUN 23 (A) 01/14/2016   CREATININE 0.58 (L) 01/25/2016 0412   CALCIUM 7.9 (L) 01/25/2016 0412   PROT 5.2 (L) 01/25/2016 0412   ALBUMIN 1.5 (L) 01/25/2016 0412   AST 17 01/25/2016 0412   ALT 12 (L) 01/25/2016 0412   ALKPHOS 106 01/25/2016 0412   BILITOT 0.5 01/25/2016 0412   GFRNONAA >60 01/25/2016 0412   GFRAA >60 01/25/2016 0412   Lipase     Component Value Date/Time   LIPASE 55 (H) 01/20/2016 0158    Anti-infectives: Anti-infectives    Start     Dose/Rate Route Frequency Ordered Stop   01/20/16 0400  vancomycin  (VANCOCIN) IVPB 1000 mg/200 mL premix  Status:  Discontinued     1,000 mg 200 mL/hr over 60 Minutes Intravenous Every 12 hours 01/20/16 0323 01/21/16 0900   01/20/16 0330  ceFEPIme (MAXIPIME) 1 g in dextrose 5 % 50 mL IVPB     1 g 100 mL/hr over 30 Minutes Intravenous  Once 01/20/16 0317 01/20/16 0735      Assessment/Plan PSBO with 2 days of nausea and vomiting on admit 01/20/16 S/P Robotic anterior and posterior vagotomy, BI anastomosis, DOR fundoplication, omentopexy for partial GOO 09/2015, DR. Viviann SpareSTEVEN GROSS  Feeding Jejunostomy and gastrostomy/EGD balloon dilatation, secondary to edema at anastomosis, 12/01/15, DR. Karie SodaSteven Gross S/p diagnostic laparoscopy, omentopexy of jejunal disruption, and washout for jejunal disruption (pt pulled out jejunostomy tube)and gastric leak. 12/03/15, Dr. Karie SodaSteven Gross (pt pulled out jejunostomy tube) Malnutrition formerly on TF/now on TNA  FEN:  NPO/TNA ID:  No abx currently DVT: Heparin/SCD   Plan: Cont current management.  Swallow eval today.  If no aspiration, will start to advance diet.  Cont TPN Consult to social work for SNF placement ([facility]) Discuss downsize/decannulation of trach with CCM    LOS: 5 days    Francine GravenElizabeth S  Emmaline KluverSimaan , [facility] 01/25/2016, 9:53 AM Pager: [phone] Consults: [phone] Mon-Fri 7:00 am-4:30 pm Sat-Sun 7:00 am-11:30 am

## 2016-01-25 NOTE — Progress Notes (Signed)
Modified Barium Swallow Progress Note  Patient Details  Name: [name] MRN: [id number] Date of Birth: November 26, 1940  Today's Date: 01/25/2016  Modified Barium Swallow completed.  Full report located under Chart Review in the Imaging Section.  Brief recommendations include the following:  Clinical Impression  Clinical ImpressionSwallow function continues to be significantly weak resulting in residuals and aspiration with poor cough mechanism.  Pt again fatigued during MBS stating "I'm tired" after only a few boluses.   Weakness results in secretion retention at pyriform sinus that mixed with barium without consistent awareness.  Compromised tongue base retraction and laryngeal elevation continues to allow laryngeal penetration and both audible and SILENT aspiration both during and after the swallow.  After swallow aspiration due to oropharyngeal residuals spilling into open airway.  Throat clearing removed trace penetrates from larynx.  Chin tuck posture decreased amount of penetration/aspiration but was not fully preventative.  Attempts to consume solids resulting in pt retching/gagging with oral transiting - cues to expectorate effective.  Pt reports gagging has been occurring with solids.  Patient appeared with decreased proximal esophageal opening/decreased CP opening resulting in residuals in pyriform sinus. Fortunately pt's swallowing ability is improved however he remains weak with inability to fully clear even mild aspirates and is aspirating secretions.   Using teach back/live video educated patient to effective compensation strategies. Recommend continue NPO except single ice chips/water with precautions using chin tuck posture and encouraging pt to "hock" to expectorate vallecular residuals and strengthen cough/voice.  Oral care, pmsv in place and cueing patient to cough/clear throat and re-swallow. Of note, pt did not recall having prior MBS x2 during last hospital admission - concerning  for cognitive issues.  Pt will need repeat MBS in future due to silent nature of dysphagia. Will follow up for dysphagia treatment/management.  Thanks for allowing me to help care for this patient.      Swallow Evaluation Recommendations   Recommended Consults: Other (Comment) (palliative consult)   SLP Diet Recommendations: Free water protocol after oral care   Liquid Administration via: Cup   Medication Administration: Via alternative means   Supervision: Patient able to self feed;Full supervision/cueing for compensatory strategies   Compensations: Slow rate;Small sips/bites;Multiple dry swallows after each bite/sip;Chin tuck;Other (Comment) ("hock and expectorate")   Postural Changes: Remain semi-upright after after feeds/meals (Comment);Seated upright at 90 degrees   Oral Care Recommendations: Oral care BID   Other Recommendations: Have oral suction available    [name], MS [facility] SLP [phone]

## 2016-01-25 NOTE — Progress Notes (Signed)
Nutrition Follow-up  DOCUMENTATION CODES:   Severe malnutrition in context of acute illness/injury  INTERVENTION:   Continue TPN per pharmacy.  RD to continue to follow for plan.   NUTRITION DIAGNOSIS:   Inadequate oral intake related to inability to eat as evidenced by NPO status.  Ongoing.  GOAL:   Patient will meet greater than or equal to 90% of their needs  Meeting with TPN.  MONITOR:   Labs, Weight trends, Skin, I & O's, Other (Comment) (TPN)  ASSESSMENT:   75 year-old male with a medical history of GERD, PUD, and CAD who presented to [facility] via EMS with a cc of nausea, vomiting, and diarrhea. Patient is s/p Robotic anterior and posterior vagotomy, BI anastomosis, DOR fundoplication, omentopexy 09/2015 for partial GOO secondary to pyloric stricture, followed by placement of feeding jejunostomy and gastrostomy tube and EGD balloon dilation of duodenal stricture 12/01/15 for recurrent GOO secondary to edema at anastomosis and failure to thrive with malnutrition. On 12/03/15 the patient was taken to the OR for a diagnostic laparoscopy, omentopexy of jejunal disruption, and washout for jejunal disruption (pt pulled out jejunostomy tube) and gastric leak.   MBS results from 12/11: mild-moderate dysphagia, severe aspiration risk. SLP recommends pt remain NPO except ice chips/water. Will need speech follow-up and repeat MBS.   TPN continues: Running at goal regimen of Clinimix E 5/15 @ 83 ml/hr + 20% ILE @ 10 ml/hr. This regimen provides 1992 ml fluid, 1897 kcal (95% minimum estimated kcal needs), 100 grams protein daily (100% minimum estimated protein needs).  Medications: Mg sulfate IV once Labs reviewed: CBGs: 119-137 Low Na, Mg Phos WNL TG: 119 mg/dL  Plan per Pharmacy 96/0412/11: Now: Magnesium 2g IV x1  At 1800 today:  Continue Clinimix E 5/15 at 83 ml/hr.  20% fat emulsion at 10 ml/hr.  Diet Order:  Diet NPO time  specified Except for: Ice Chips, Other (See Comments) TPN (CLINIMIX-E) Adult TPN (CLINIMIX-E) Adult  Skin:     Last BM:  12/8  Height:   Ht Readings from Last 1 Encounters:  01/21/16 6\' 2"  (1.88 m)    Weight:   Wt Readings from Last 1 Encounters:  01/21/16 175 lb 9.6 oz (79.7 kg)    Ideal Body Weight:  86.4 kg  BMI:  Body mass index is 22.55 kg/m.  Estimated Nutritional Needs:   Kcal:  2000-2200  Protein:  100-115g  Fluid:  2L/day  EDUCATION NEEDS:   No education needs identified at this time  Tilda FrancoLindsey Kalix Meinecke, MS, RD, LDN Pager: [phone] After Hours Pager: [phone]

## 2016-01-26 ENCOUNTER — Inpatient Hospital Stay (HOSPITAL_COMMUNITY): Payer: Medicare Other

## 2016-01-26 DIAGNOSIS — R1310 Dysphagia, unspecified: Secondary | ICD-10-CM

## 2016-01-26 LAB — COMPREHENSIVE METABOLIC PANEL
ALK PHOS: 178 U/L — AB (ref 38–126)
ALT: 34 U/L (ref 17–63)
ANION GAP: 5 (ref 5–15)
AST: 58 U/L — ABNORMAL HIGH (ref 15–41)
Albumin: 1.7 g/dL — ABNORMAL LOW (ref 3.5–5.0)
BUN: 27 mg/dL — ABNORMAL HIGH (ref 6–20)
CALCIUM: 7.9 mg/dL — AB (ref 8.9–10.3)
CO2: 26 mmol/L (ref 22–32)
Chloride: 99 mmol/L — ABNORMAL LOW (ref 101–111)
Creatinine, Ser: 0.65 mg/dL (ref 0.61–1.24)
Glucose, Bld: 126 mg/dL — ABNORMAL HIGH (ref 65–99)
Potassium: 4.1 mmol/L (ref 3.5–5.1)
SODIUM: 130 mmol/L — AB (ref 135–145)
Total Bilirubin: 0.4 mg/dL (ref 0.3–1.2)
Total Protein: 5.7 g/dL — ABNORMAL LOW (ref 6.5–8.1)

## 2016-01-26 LAB — GLUCOSE, CAPILLARY
GLUCOSE-CAPILLARY: 110 mg/dL — AB (ref 65–99)
GLUCOSE-CAPILLARY: 111 mg/dL — AB (ref 65–99)
GLUCOSE-CAPILLARY: 116 mg/dL — AB (ref 65–99)
GLUCOSE-CAPILLARY: 135 mg/dL — AB (ref 65–99)

## 2016-01-26 LAB — MAGNESIUM: MAGNESIUM: 1.9 mg/dL (ref 1.7–2.4)

## 2016-01-26 MED ORDER — POTASSIUM CHLORIDE IN NACL 20-0.9 MEQ/L-% IV SOLN: INTRAVENOUS | Status: DC

## 2016-01-26 MED ORDER — OSMOLITE 1.5 CAL PO LIQD
237.0000 mL | Freq: Two times a day (BID) | ORAL | Status: DC
  Administered 2016-01-26: 237 mL
  Filled 2016-01-26 (×2): qty 237

## 2016-01-26 MED ORDER — POTASSIUM CHLORIDE IN NACL 20-0.9 MEQ/L-% IV SOLN
INTRAVENOUS | Status: AC
  Administered 2016-01-26 – 2016-01-27 (×2): via INTRAVENOUS
  Filled 2016-01-26 (×2): qty 1000

## 2016-01-26 MED ORDER — FAT EMULSION 20 % IV EMUL
240.0000 mL | INTRAVENOUS | Status: AC
  Administered 2016-01-26: 240 mL via INTRAVENOUS
  Filled 2016-01-26: qty 250

## 2016-01-26 MED ORDER — PIPERACILLIN-TAZOBACTAM 3.375 G IVPB
3.3750 g | Freq: Three times a day (TID) | INTRAVENOUS | Status: DC
  Administered 2016-01-26 – 2016-02-04 (×26): 3.375 g via INTRAVENOUS
  Filled 2016-01-26 (×29): qty 50

## 2016-01-26 MED ORDER — TRACE MINERALS CR-CU-MN-SE-ZN 10-1000-500-60 MCG/ML IV SOLN
INTRAVENOUS | Status: AC
  Administered 2016-01-26: 18:00:00 via INTRAVENOUS
  Filled 2016-01-26: qty 1992

## 2016-01-26 MED ORDER — SODIUM CHLORIDE 0.9 % IV SOLN: INTRAVENOUS | Status: DC

## 2016-01-26 MED ORDER — VANCOMYCIN HCL IN DEXTROSE 1-5 GM/200ML-% IV SOLN: 1000.0000 mg | Freq: Two times a day (BID) | INTRAVENOUS | Status: DC

## 2016-01-26 NOTE — Progress Notes (Signed)
RN called RT and said that patient had aspirated. RT came to room and immediately took red cap off. Patient was vomiting brown emesis liquid that smells like stool out of his trach. RT placed humidified oxygen back on patient and oxygen sats were 85% at that time. On 28% ATC it came up to 89%. RT increased oxygen to 60% and O2 sats are now at 95%. Chest x Cassandria Drew is being done at this time. RT is monitoring.

## 2016-01-26 NOTE — Progress Notes (Signed)
Physical Therapy Treatment Patient Details Name: Danny GalRalph T Edman MRN: [id number] DOB: [birth year] Today's Date: 01/26/2016    [facility] of Present Illness [facility] N/V r/o ileus    PT Comments    Trach capped this morning and doing well on room air Assisted OOB to amb.  Tolerated an increased distance.  Still using B platform walker for increased support and to increase distance.  #rd assist needed to follow with recliner as pt tends to sit quickly due to fatigue.  Progressing well.    Follow Up Recommendations  SNF     Equipment Recommendations  None recommended by PT    Recommendations for Other Services       Precautions / Restrictions Precautions Precaution Comments: NPO (X ice chips), gastric tube Restrictions Weight Bearing Restrictions: No    Mobility  Bed Mobility Overal bed mobility: Needs Assistance Bed Mobility: Supine to Sit     Supine to sit: Supervision;Min guard     General bed mobility comments: assist with lines only with elevated HOB and use of rails  Transfers Overall transfer level: Needs assistance Equipment used: None Transfers: Sit to/from Stand;Stand Pivot Transfers Sit to Stand: +2 safety/equipment;Min assist;Mod assist Stand pivot transfers: +2 safety/equipment;Min assist;Mod assist       General transfer comment: 50% VC's to push self up from surface vs pull up from walker.  Increased assist up from lower surface recliner level.  50% VC's to reach back prior to sit to control decend  Ambulation/Gait Ambulation/Gait assistance: Min assist;+2 safety/equipment Ambulation Distance (Feet): 75 Feet (55 feet then another 20 feet with one ) Assistive device: Bilateral platform walker Gait Pattern/deviations: Step-through pattern;Decreased stride length;Trunk flexed Gait velocity: decreased   General Gait Details: amb with B platform walker for increased support and to increase distance.  Amb on RA avg sats 94% and HR 102.   Third assist to follow with recliner as pt tends to sit quickly due to fatigue.     Stairs            Wheelchair Mobility    Modified Rankin (Stroke Patients Only)       Balance                                    Cognition Arousal/Alertness: Awake/alert Behavior During Therapy: WFL for tasks assessed/performed Overall Cognitive Status: Within Functional Limits for tasks assessed                      Exercises      General Comments        Pertinent Vitals/Pain Pain Assessment: No/denies pain    Home Living                      Prior Function            PT Goals (current goals can now be found in the care plan section) Progress towards PT goals: Progressing toward goals    Frequency    Min 3X/week      PT Plan Current plan remains appropriate    Co-evaluation             End of Session Equipment Utilized During Treatment: Gait belt Activity Tolerance: Patient limited by fatigue Patient left: in chair;with call bell/phone within reach;with family/visitor present     Time: [id number] PT Time Calculation (min) (ACUTE ONLY):  29 min  Charges:  $Gait Training: 8-22 mins $Therapeutic Activity: 8-22 mins                    G Codes:      Felecia ShellingLori Embrie Mikkelsen  PTA [facility]  Acute  Rehab Pager      [phone]

## 2016-01-26 NOTE — Progress Notes (Signed)
Bolus feeding given through tube and patient c/o nausea approximately 45 min after flush. Patient vomited small amonut of what looked like tube feeding. Antiemetic administered. Will continue to monitor.

## 2016-01-26 NOTE — Progress Notes (Signed)
[facility]  Subjective:  Pt denies acute complaints.  Trach capped this am ~ 0900.  Tolerating well thus far.  On room air.  No secretions at trach site.   Objective: Vitals:   01/25/16 2215 01/25/16 2334 01/26/16 0553 01/26/16 1100  BP: (!) 122/59  (!) 145/48   Pulse: 95 81 92 92  Resp: 18 16 18 18   Temp: 98 F (36.7 C)  98.2 F (36.8 C)   TempSrc: Oral  Oral   SpO2: 98% 100% 98%   Weight:      Height:         Exam: Awake, alert, sitting quietly in bed, no distress Tracheostomy in place, no drainage nearby, red cap in place, strong voice Lungs clear to auscultation with normal effort, occasional cough Cardiovascular regular rate and rhythm no murmurs gallops or rubs Neurologic: Awake, alert, no distress  01/21/2016 chest x-ray with very mild left lower lobe airspace disease 12/11 SLP Eval >> mild oral phase dysphagia, moderate pharyngeal phase dysphagia, mild cervical esophageal dysphagia   Impression: Tracheostomy status Small bowel obstruction  Discussion: Mr. [name] is well-known to our service from a recent ICU stay where he had a tracheostomy. He's been doing well at SNF with some cough and mucus production. He's been stable overall from a respiratory standpoint though on today's modified barium swallow he was noted to still have some degree of pharyngeal phase aspiration. The tracheostomy is likely not helping in regards to his aspiration. Given his normal ventilatory mechanics in oxygenation it's reasonable to consider decannulation. Trach capped 12/12 0900.    Plan: Cap trach & observe for 24 hours Monitor saturations overnight.   Discussed with nursing to leave capped overnight unless desaturation Continue swallowing evaluation and management per primary service and speech therapy Will follow up in am for possible decannulation   Canary BrimBrandi Chloe Baig, NP-C  Pulmonary & Critical Care Pgr: [phone] or if no answer [phone] 01/26/2016, 12:24  PM

## 2016-01-26 NOTE — Progress Notes (Signed)
Per order, RT Capped trach. Prior to Capping, RT suctioned (small, white , thick, tolerated well), changed inner cannula. Current sp02 with Cap 97%, HR 93, RR 17, BS Clear- diminished. PT states he is breathing well at this time. RN aware and is at bedside at this time.

## 2016-01-26 NOTE — Progress Notes (Signed)
Nutrition Follow-up  DOCUMENTATION CODES:   Severe malnutrition in context of acute illness/injury  INTERVENTION:   TPN per Pharmacy -if tolerates bolus feeds today, recommend weaning.  12/12: Initiate Osmolite 1.5, 2 cans (1000, 1800), providing 710 kcal, 30g protein. 12/13: Advance to 4 cans daily (0800, 1200, 1600, 2000), providing 1420 kcal, 60g protein.   Goal: 6 cans of Osmolite 1.5 daily. 2 cans TID (0800, 1400, 2000).  Recommend free water flushes of 200 ml QID (800 ml total) Provides 2130 kcal (100% of needs), 90 g protein (90% of needs) and 1886 ml H2O.  NUTRITION DIAGNOSIS:   Inadequate oral intake related to inability to eat as evidenced by NPO status.  Ongoing.  GOAL:   Patient will meet greater than or equal to 90% of their needs  Meeting with TPN.  MONITOR:   Labs, Weight trends, TF tolerance, Skin, I & O's, Other (Comment) (TPN)  ASSESSMENT:   75 year-old male with a medical history of GERD, PUD, and CAD who presented to [facility] ED from [facility] via EMS with a cc of nausea, vomiting, and diarrhea. Patient is s/p Robotic anterior and posterior vagotomy, BI anastomosis, DOR fundoplication, omentopexy 09/2015 for partial GOO secondary to pyloric stricture, followed by placement of feeding jejunostomy and gastrostomy tube and EGD balloon dilation of duodenal stricture 12/01/15 for recurrent GOO secondary to edema at anastomosis and failure to thrive with malnutrition. On 12/03/15 the patient was taken to the OR for a diagnostic laparoscopy, omentopexy of jejunal disruption, and washout for jejunal disruption (pt pulled out jejunostomy tube) and gastric leak.  12/11: MBS results,  mild-moderate dysphagia, severe aspiration risk. SLP recommends pt remain NPO except ice chips/water. Will need speech follow-up and repeat MBS.   Per surgical PA, tube feeds to be resumed via g-tube. Reviewed recommendations with PA. Pt will start bolus feeds of  Osmolite 1.5, only 2 cans today to assess tolerance. If tolerates, will advance to 4 cans tomorrow 12/13. Goal will be 6 cans daily of Osmolite 1.5.  Pt continues to receive Clinimix E 5/15 @ 83 ml/hr + 20% ILE @ 10 ml/hr. This regimen provides 1992 ml fluid, 1897 kcal (95% minimum estimated kcal needs), 100 grams protein daily (100% minimum estimated protein needs).  Medications: Protonix IV every 12 hours Labs reviewed: CBGs: 111-118 Low Na Mg WNL  Plan per Pharmacy 12/12: At 1800 today:  Continue Clinimix E 5/15 at 83 ml/hr.  Continue 20% fat emulsion to 10 ml/hr.  Diet Order:  Diet NPO time specified Except for: Ice Chips, Other (See Comments) TPN (CLINIMIX-E) Adult TPN (CLINIMIX-E) Adult  Skin:   Stage II sacral pressure injury  Last BM:  12/8  Height:   Ht Readings from Last 1 Encounters:  01/21/16 6\' 2"  (1.88 m)    Weight:   Wt Readings from Last 1 Encounters:  01/21/16 175 lb 9.6 oz (79.7 kg)    Ideal Body Weight:  86.4 kg  BMI:  Body mass index is 22.55 kg/m.  Estimated Nutritional Needs:   Kcal:  2000-2200  Protein:  100-115g  Fluid:  2L/day  EDUCATION NEEDS:   No education needs identified at this time  Danny FrancoLindsey Michaiah Maiden, MS, RD, LDN Pager: [phone] After Hours Pager: [phone]

## 2016-01-26 NOTE — Progress Notes (Signed)
Patient ID: [name], male   DOB: 1940-05-04, 75 y.o.   MRN: [id number]  [facility] Progress Note     Subjective: Feeling well this morning. Denies any abdominal pain. Having bowel function.  Objective: Vital signs in last 24 hours: Temp:  [98 F (36.7 C)-98.2 F (36.8 C)] 98.2 F (36.8 C) (12/12 0553) Pulse Rate:  [81-95] 92 (12/12 0553) Resp:  [16-18] 18 (12/12 0553) BP: (122-145)/(48-59) 145/48 (12/12 0553) SpO2:  [98 %-100 %] 98 % (12/12 0553) FiO2 (%):  [28 %] 28 % (12/11 2334) Last BM Date:  (patient does not remember)  Intake/Output from previous day: 12/11 0701 - 12/12 0700 In: 1330.2 [I.V.:826.2; NG/GT:237] Out: 800 [Urine:800] Intake/Output this shift: Total I/O In: 0  Out: 350 [Urine:350]  PE: Gen:  Alert, NAD, pleasant and cooperative HEENT: tracheostomy tube in place corked Pulm: unlabored  Abd: Soft, NT/ND, +BS, g-tube site c/d/i Ext:  No erythema or tenderness   Lab Results:   Recent Labs  01/25/16 0412  WBC 7.7  HGB 7.8*  HCT 23.4*  PLT 369   BMET  Recent Labs  01/25/16 0412 01/26/16 0449  NA 131* 130*  K 3.9 4.1  CL 99* 99*  CO2 27 26  GLUCOSE 104* 126*  BUN 23* 27*  CREATININE 0.58* 0.65  CALCIUM 7.9* 7.9*   PT/INR No results for input(s): LABPROT, INR in the last 72 hours. CMP     Component Value Date/Time   NA 130 (L) 01/26/2016 0449   NA 139 01/14/2016   K 4.1 01/26/2016 0449   CL 99 (L) 01/26/2016 0449   CO2 26 01/26/2016 0449   GLUCOSE 126 (H) 01/26/2016 0449   BUN 27 (H) 01/26/2016 0449   BUN 23 (A) 01/14/2016   CREATININE 0.65 01/26/2016 0449   CALCIUM 7.9 (L) 01/26/2016 0449   PROT 5.7 (L) 01/26/2016 0449   ALBUMIN 1.7 (L) 01/26/2016 0449   AST 58 (H) 01/26/2016 0449   ALT 34 01/26/2016 0449   ALKPHOS 178 (H) 01/26/2016 0449   BILITOT 0.4 01/26/2016 0449   GFRNONAA >60 01/26/2016 0449   GFRAA >60 01/26/2016 0449   Lipase     Component Value Date/Time   LIPASE 55 (H) 01/20/2016 0158        Studies/Results: Dg Swallowing Func-speech Pathology  Result Date: 01/25/2016 Objective Swallowing Evaluation: Type of Study: MBS-Modified Barium Swallow Study Patient Details Name: [name] MRN: [id number] Date of Birth: 1940-05-04 Today's Date: 01/25/2016 Time: SLP Start Time (ACUTE ONLY): 1153-SLP Stop Time (ACUTE ONLY): 1215 SLP Time Calculation (min) (ACUTE ONLY): 22 min Past Medical History: Past Medical History: Diagnosis Date . Bilateral dry eyes  . Bile peritonitis due to gastric tube dislodgement 12/03/2015 . BPH (benign prostatic hyperplasia)  . Central pterygium of right eye  . Coronary artery disease 1997, 2009  bare mental stent right coronart artery, occlusive followed by Dr. Garnette Scheu[place]Hank Smith in [place] . Degenerative disc disease   LOWER BACK . Dysphonia  . ED (erectile dysfunction)  . Gastric outlet obstruction 11/24/2015 . Gastroesophageal reflux disease  . Hyperlipidemia  . Hyperopia of both eyes with astigmatism and presbyopia  . Hypertension  . Nuclear senile cataract   Dr. Bernadette [facility] in [place] . Peptic ulcer  . Pneumatosis of intestines 12/11/2015 . Pneumonia   HISTORY OF IN CHILDHOOD . Posterior vitreous detachment 10/30/2012 . Salzmann's nodular dystrophy of left eye  . Urinary hesitancy  . Wears glasses  Past Surgical History: Past Surgical History:  Procedure Laterality Date . CARDIAC CATHETERIZATION   . CHOLECYSTECTOMY   . ESOPHAGOGASTRODUODENOSCOPY N/A 12/01/2015  Procedure: ESOPHAGOGASTRODUODENOSCOPY (EGD) BALLOON DILATION OF DUODENAL STRICTURE;  Surgeon: Karie Soda, MD;  Location: [facility];  Service: General;  Laterality: N/A; . ESOPHAGOGASTRODUODENOSCOPY N/A 12/14/2015  Procedure: ESOPHAGOGASTRODUODENOSCOPY (EGD);  Surgeon: Vida Rigger, MD;  Location: [facility];  Service: Endoscopy;  Laterality: N/A;  Patient has tracheostomy . ESOPHAGOGASTRODUODENOSCOPY (EGD) WITH PROPOFOL N/A 07/01/2015  Procedure: ESOPHAGOGASTRODUODENOSCOPY (EGD) WITH PROPOFOL;   Surgeon: Willis Modena, MD;  Location: [facility];  Service: Endoscopy;  Laterality: N/A; . ESOPHAGOGASTRODUODENOSCOPY (EGD) WITH PROPOFOL N/A 11/23/2015  Procedure: ESOPHAGOGASTRODUODENOSCOPY (EGD) WITH PROPOFOL;  Surgeon: Willis Modena, MD;  Location: [facility] ENDOSCOPY;  Service: Endoscopy;  Laterality: N/A;  may need to intubate . EYE SURGERY    growth on right eye removed  . IR GENERIC HISTORICAL  12/13/2015  IR REPLC DUODEN/JEJUNO TUBE PERCUT W/FLUORO 12/13/2015 [name], MD [facility]-INTERV RAD . LAPAROSCOPIC GASTROSTOMY N/A 12/01/2015  Procedure: LAPAROSCOPIC PLACEMENT OF FEEDING JEJUNOSTOMY AND GASTROSTOMY TUBE;  Surgeon: Karie Soda, MD;  Location: [facility];  Service: General;  Laterality: N/A; . LAPAROSCOPY N/A 12/03/2015  Procedure: LAPAROSCOPY DIAGNOSTIC, OMENTOPEXY, JEJUNOSTOMY, WASH OUT;  Surgeon: Karie Soda, MD;  Location: [facility];  Service: General;  Laterality: N/A; . STENT TO HEART    2 1997 AND 1 IN 1996 . TONSILLECTOMY   . XI ROBOTIC VAGOTOMY AND ANTRECTOMY N/A 09/25/2015  Procedure: XI ROBOTIC ANTERIOR AND POSTERIOR VAGOTOMY, BILROTH I  ANASTOMOSIS DOR FUNDIPLICATION, OMENTOPEXY  UPPER ENDOSCOPY;  Surgeon: Karie Soda, MD;  Location: [facility];  Service: General;  Laterality: N/A; HPI: [name] is a 75 y.o. male with h/o CAD and GERD who presents to the Emergency Department from [facility] by EMS complaining of nausea, vomiting, and diarrhea onset onset two days ago, worsening yesterday. Pt also has associated mild abdominal pain. Per daughter, pt has been admitted to the hospital recently for surgery in his abdomen; he has been staying at [facility] since then. Daughter reported to MD that pt has been fed through a G tube which gave him diarrhea that eventually resolved with a change in formula.   Daughter also informed MD that pt gradually started feeding by mouth again x2 days with return of diarrhea along with nausea and vomiting. Daughter notes that the facility staff  have tried to change his formula but with no relief to his symptoms.  Pt has known h/o dysphagia dysphonia, pna, peptic ulcer, GERD.  Last MBS done 12/29/15 showed moderate oropharyngeal dysphagia that looked marginally worse than test ono 12/17/15.  Pt left on ice chips only after oral care.  CXR 12/6 showed LLL consolidation ? ATX or pna.   Subjective: pt awake in chair Assessment / Plan / Recommendation CHL IP CLINICAL IMPRESSIONS 01/25/2016 Therapy Diagnosis Mild oral phase dysphagia;Moderate pharyngeal phase dysphagia;Mild cervical esophageal phase dysphagia Clinical Impression Clinical ImpressionSwallow function continues to be significantly weak resulting in residuals and aspiration with poor cough mechanism.  Pt again fatigued during MBS stating "I'm tired" after only a few boluses.   Weakness results in secretion retention at pyriform sinus that mixed with barium without consistent awareness.  Compromised tongue base retraction and laryngeal elevation continues to allow laryngeal penetration and both audible and SILENT aspiration both during and after the swallow.  After swallow aspiration due to oropharyngeal residuals spilling into open airway.  Throat clearing removed trace penetrates from larynx.  Chin tuck posture decreased amount of penetration/aspiration but  was not fully preventative.  Attempts to consume solids resulting in pt retching/gagging with oral transiting - cues to expectorate effective.  Pt reports gagging has been occurring with solids.  Patient appeared with decreased proximal esophageal opening/decreased CP opening resulting in residuals in pyriform sinus. Fortunately pt's swallowing ability is improved however he remains weak with inability to fully clear even mild aspirates and is aspirating secretions.   Using teach back/live video educated patient to effective compensation strategies. Recommend continue NPO except single ice chips/water with precautions using chin tuck posture and  encouraging pt to "hock" to expectorate vallecular residuals and strengthen cough/voice.  Oral care, pmsv in place and cueing patient to cough/clear throat and re-swallow. Of note, pt did not recall having prior MBS x2 during last hospital admission - concerning for cognitive issues.  Pt will need repeat MBS in future due to silent nature of dysphagia. Will follow up for dysphagia treatment/management.  Thanks for allowing me to help care for this patient.    Impact on safety and function Moderate aspiration risk;Severe aspiration risk;Risk for inadequate nutrition/hydration   CHL IP TREATMENT RECOMMENDATION 01/25/2016 Treatment Recommendations Therapy as outlined in treatment plan below   Prognosis 01/25/2016 Prognosis for Safe Diet Advancement Fair Barriers to Reach Goals Cognitive deficits;Time post onset;Severity of deficits Barriers/Prognosis Comment -- CHL IP DIET RECOMMENDATION 01/25/2016 SLP Diet Recommendations Free water protocol after oral care Liquid Administration via Cup Medication Administration Via alternative means Compensations Slow rate;Small sips/bites;Multiple dry swallows after each bite/sip;Chin tuck;Other (Comment) Postural Changes Remain semi-upright after after feeds/meals (Comment);Seated upright at 90 degrees   CHL IP OTHER RECOMMENDATIONS 01/25/2016 Recommended Consults Other (Comment) Oral Care Recommendations Oral care BID Other Recommendations Have oral suction available   CHL IP FOLLOW UP RECOMMENDATIONS 01/25/2016 Follow up Recommendations [facility]illed [facility] FREQUENC[facility] D[facility]ATION 01/25/2016 Speech Therapy Frequency (ACUTE ONLY) min 2x/week Treatment Duration 1 week      CHL IP ORAL PHASE 01/25/2016 Oral Phase Impaired Oral - Pudding Teaspoon -- Oral - Pudding Cup -- Oral - Honey Teaspoon -- Oral - Honey Cup -- Oral - Nectar Teaspoon Weak lingual manipulation;Delayed oral transit Oral - Nectar Cup Weak lingual manipulation;Delayed oral transit Oral - Nectar Straw Weak  lingual manipulation;Delayed oral transit Oral - Thin Teaspoon WFL Oral - Thin Cup WFL Oral - Thin Straw WFL Oral - Puree Weak lingual manipulation;Piecemeal swallowing;Delayed oral transit Oral - Mech Soft Other (Comment) Oral - Regular -- Oral - Multi-Consistency -- Oral - Pill -- Oral Phase - Comment pt reports solid food sensitivity causing him to gag   CHL IP PHARYNGEAL PHASE 01/25/2016 Pharyngeal Phase Impaired Pharyngeal- Pudding Teaspoon -- Pharyngeal -- Pharyngeal- Pudding Cup -- Pharyngeal -- Pharyngeal- Honey Teaspoon -- Pharyngeal -- Pharyngeal- Honey Cup -- Pharyngeal -- Pharyngeal- Nectar Teaspoon Reduced laryngeal elevation;Reduced airway/laryngeal closure;Reduced tongue base retraction;Penetration/Aspiration during swallow;Pharyngeal residue - valleculae;Pharyngeal residue - pyriform Pharyngeal Material enters airway, remains ABOVE vocal cords and not ejected out Pharyngeal- Nectar Cup Reduced epiglottic inversion;Reduced laryngeal elevation;Reduced airway/laryngeal closure;Reduced tongue base retraction;Penetration/Aspiration during swallow;Pharyngeal residue - valleculae;Pharyngeal residue - pyriform Pharyngeal Material enters airway, remains ABOVE vocal cords and not ejected out Pharyngeal- Nectar Straw Reduced epiglottic inversion;Reduced laryngeal elevation;Reduced airway/laryngeal closure;Reduced tongue base retraction;Penetration/Aspiration during swallow;Pharyngeal residue - valleculae;Pharyngeal residue - pyriform Pharyngeal Material enters airway, remains ABOVE vocal cords and not ejected out Pharyngeal- Thin Teaspoon Reduced epiglottic inversion;Reduced anterior laryngeal mobility;Reduced laryngeal elevation;Reduced airway/laryngeal closure;Reduced tongue base retraction;Trace aspiration;Pharyngeal residue - valleculae;Pharyngeal residue - pyriform Pharyngeal Material enters  airway, passes BELOW cords without attempt by patient to eject out (silent aspiration) Pharyngeal- Thin Cup Reduced  epiglottic inversion;Reduced laryngeal elevation;Reduced airway/laryngeal closure;Reduced tongue base retraction;Penetration/Aspiration during swallow;Penetration/Apiration after swallow;Trace aspiration;Significant aspiration (Amount);Pharyngeal residue - valleculae;Pharyngeal residue - pyriform Pharyngeal Material enters airway, passes BELOW cords and not ejected out despite cough attempt by patient;Material enters airway, passes BELOW cords without attempt by patient to eject out (silent aspiration) Pharyngeal- Thin Straw Reduced pharyngeal peristalsis;Reduced epiglottic inversion;Reduced laryngeal elevation;Reduced airway/laryngeal closure;Reduced tongue base retraction;Trace aspiration;Pharyngeal residue - valleculae;Pharyngeal residue - pyriform Pharyngeal -- Pharyngeal- Puree Delayed swallow initiation-vallecula;Reduced epiglottic inversion;Reduced anterior laryngeal mobility;Reduced laryngeal elevation;Reduced tongue base retraction;Pharyngeal residue - valleculae Pharyngeal -- Pharyngeal- Mechanical Soft -- Pharyngeal -- Pharyngeal- Regular NT Pharyngeal -- Pharyngeal- Multi-consistency -- Pharyngeal -- Pharyngeal- Pill -- Pharyngeal -- Pharyngeal Comment chin tuck posture decreased amount of penetration/aspiration but did not fully eliminate it, cough was weak and did not fully clear trace aspirates, pt initially with audible aspiration but later it was silent, following solids with thin faciliated pharyngeal clearance but with aspiration of secretions/liquids=   CHL IP CERVICAL ESOPHAGEAL PHASE 01/25/2016 Cervical Esophageal Phase Impaired Pudding Teaspoon -- Pudding Cup -- Honey Teaspoon -- Honey Cup -- Nectar Teaspoon Reduced cricopharyngeal relaxation Nectar Cup Reduced cricopharyngeal relaxation Nectar Straw -- Thin Teaspoon Reduced cricopharyngeal relaxation Thin Cup Reduced cricopharyngeal relaxation Thin Straw Reduced cricopharyngeal relaxation Puree Reduced cricopharyngeal relaxation;Esophageal  backflow into cervical esophagus Mechanical Soft -- Regular -- Multi-consistency -- Pill -- Cervical Esophageal Comment -- No flowsheet data found. Donavan Burnet, MS [facility] SLP [phone]               Anti-infectives: Anti-infectives    Start     Dose/Rate Route Frequency Ordered Stop   01/20/16 0400  vancomycin (VANCOCIN) IVPB 1000 mg/200 mL premix  Status:  Discontinued     1,000 mg 200 mL/hr over 60 Minutes Intravenous Every 12 hours 01/20/16 0323 01/21/16 0900   01/20/16 0330  ceFEPIme (MAXIPIME) 1 g in dextrose 5 % 50 mL IVPB     1 g 100 mL/hr over 30 Minutes Intravenous  Once 01/20/16 0317 01/20/16 0735       Assessment/Plan PSBO with 2 days of nausea and vomiting on admit 01/20/16 S/P Robotic anterior and posterior vagotomy, BI anastomosis, DOR fundoplication, omentopexy for partial GOO 09/2015, DR. Viviann Spare GROSS  Feeding Jejunostomy and gastrostomy/EGD balloon dilatation, secondary to edema at anastomosis, 12/01/15, DR. Karie Soda S/p diagnostic laparoscopy, omentopexy of jejunal disruption, and washout for jejunal disruption (pt pulled out jejunostomy tube)and gastric leak. 12/03/15, Dr. Karie Soda (pt pulled out jejunostomy tube) Malnutrition formerly on TF/now on TNA  FEN: NPO/TNA, restart G-tube feeds per nutrition ID: No abx currently DVT: Heparin/SCD   Plan: Swallow eval recommending NPO except ice chips and limited sips of water with chin tuck. recommend continuing TPN for and will restart G-tube feeds per dietician (plan for 2 cans Osmalite today and monitor for tolerance, plan to gradually increase depending on tolerance) Family requesting not to go back to [employer] farm Downsize/decannulation of trach per CCM   LOS: 6 days    Edson Snowball , [facility] Surgery 01/26/2016, 9:27 AM Pager: [phone] Consults: [phone] Mon-Fri 7:00 am-4:30 pm Sat-Sun 7:00 am-11:30 am

## 2016-01-26 NOTE — Progress Notes (Addendum)
[name]PHARMACY - ADULT TOTAL PARENTERAL NUTRITION CONSULT NOTE   Pharmacy Consult for TPN Indication: PTA TPN continuation, intolerance to enteral feed  Insulin Requirements: 1 units /24 h   Current Nutrition: PTA TPN [facility] advanced home care [facility] pt received 405 grams (1376 kcal)  of dextrose, 75 grams (300 kcal) of protein and 50 grams ( 480 kcal) of lipid for a total of 2156 kcal/day  Osmolite 1.2 CAL via FT BID ordered 12/11 at 6PM  IVF: none   Central access: PICC TPN start date: PTA, restarted on admission on 12/6   ASSESSMENT                                                                                                          HPI: [id number] yoM with hx PUD, CAD, GERD, BPH,  s/p multiple procedures  and extended hospitalization for recurrent partial GOO 2/2 pyloric stricture on TPN for several days, discharged 11/15 with J/G tube in place on TFs, unable to tolerate and had to [facility], readmitted 12/6 with partial SBO. Pharmacy consulted to resume TPN.  Significant events:  12/6: TPN continued on admission. [facility] 12/7: Pt pulled out NGT, not replaced, remains NPO 12/8: trial of ice chips 12/10: continue TPN, plan swallow study Monday 12/11, if no aspiration, will start to advance diet.   12/11: Awaiting MBS.  Having bowel function.  Continue TPN for now.   12/12: Severe aspiration risk per MBS yesterday - recommend continuing NPO. Osmolite 1.2 CAL via TF BID ordered 12/11 at 6pm. Per discussion with [facility] PA, continue TPN at current goal rate today. F/u for ability to wean tomorrow if tolerates TF.  Today:   Glucose - No hx DM noted, CBGs at goal - SSI q6h  Electrolytes - Na remains low, stable, Mg improved s/p replacement 12/11, others WNL  Renal - SCr WNL  LFTs - AST/ALT, AlkPhos rising  TGs - 177 ( 11/27), 93 (12/7), 119 (12/11)  Prealbumin - 15.3 ( 11/13), 10.8 (12/7), 12.8 (12/11)  NUTRITIONAL GOALS                                                                                              RD recs (12/7): Kcal 2000-2200, Protein 100-115g, Fluid 2L/day  [facility] TPN: 2156 Kcal/day, Protein 75g/day  Clinimix E 5/15 at a goal rate of 83 ml/hr + 20% fat emulsion at  10 ml/hr to provide: 100g/day protein, 1894 Kcal/day.  **There is currently a Acupuncturistnational backorder of Clinimix solution. To conserve supply, will keep goal rate of Clinimix 5/15 at 83 ml/hr + 20% fat emulsion at 7010ml/hr  PLAN  At 1800 today:  Continue Clinimix E 5/15 at 83 ml/hr.  Continue 20% fat emulsion to 10 ml/hr.  TPN to contain standard multivitamins and trace elements.  Continue sensitive SSI coverage q6h .   TPN lab panels on Mondays & Thursdays  Recheck CMET, Mg in AM  F/u tolerance of tube feedings, ability to wean/stop TPN  F/u daily  Loralee PacasErin Oreste Majeed, PharmD, BCPS Pager: [phone] 01/26/2016, 7:46 AM

## 2016-01-27 LAB — GLUCOSE, CAPILLARY
GLUCOSE-CAPILLARY: 121 mg/dL — AB (ref 65–99)
GLUCOSE-CAPILLARY: 122 mg/dL — AB (ref 65–99)
GLUCOSE-CAPILLARY: 126 mg/dL — AB (ref 65–99)
Glucose-Capillary: 112 mg/dL — ABNORMAL HIGH (ref 65–99)

## 2016-01-27 LAB — MAGNESIUM
Magnesium: 2.4 mg/dL (ref 1.7–2.4)
Magnesium: 1.6 mg/dL — ABNORMAL LOW (ref 1.7–2.4)

## 2016-01-27 LAB — BASIC METABOLIC PANEL
Anion gap: 5 (ref 5–15)
BUN: 35 mg/dL — AB (ref 6–20)
CO2: 25 mmol/L (ref 22–32)
CREATININE: 0.71 mg/dL (ref 0.61–1.24)
Calcium: 7.8 mg/dL — ABNORMAL LOW (ref 8.9–10.3)
Chloride: 100 mmol/L — ABNORMAL LOW (ref 101–111)
GFR calc Af Amer: >60 mL/min (ref >60)
GLUCOSE: 137 mg/dL — AB (ref 65–99)
Potassium: 4.4 mmol/L (ref 3.5–5.1)
SODIUM: 130 mmol/L — AB (ref 135–145)

## 2016-01-27 LAB — CBC WITH DIFFERENTIAL/PLATELET
BASOS ABS: 0.1 10*3/uL (ref 0.0–0.1)
Basophils Absolute: 0 10*3/uL (ref 0.0–0.1)
Basophils Relative: 0 %
Basophils Relative: 0 %
EOS ABS: 0 10*3/uL (ref 0.0–0.7)
EOS ABS: 0 10*3/uL (ref 0.0–0.7)
EOS PCT: 0 %
EOS PCT: 0 %
HCT: 27.1 % — ABNORMAL LOW (ref 39.0–52.0)
HEMATOCRIT: 26.5 % — AB (ref 39.0–52.0)
Hemoglobin: 9.2 g/dL — ABNORMAL LOW (ref 13.0–17.0)
Hemoglobin: 8.6 g/dL — ABNORMAL LOW (ref 13.0–17.0)
LYMPHS ABS: 2.6 10*3/uL (ref 0.7–4.0)
LYMPHS ABS: 1.6 10*3/uL (ref 0.7–4.0)
LYMPHS PCT: 9 %
LYMPHS PCT: 9 %
MCH: 29.5 pg (ref 26.0–34.0)
MCH: 32.5 pg (ref 26.0–34.0)
MCHC: 32.5 g/dL (ref 30.0–36.0)
MCHC: 33.9 g/dL (ref 30.0–36.0)
MCV: 90.8 fL (ref 78.0–100.0)
MCV: 95.8 fL (ref 78.0–100.0)
MONO ABS: 2.7 10*3/uL — AB (ref 0.1–1.0)
MONO ABS: 1.5 10*3/uL — AB (ref 0.1–1.0)
MONOS PCT: 8 %
Monocytes Relative: 9 %
Neutro Abs: 14.6 10*3/uL — ABNORMAL HIGH (ref 1.7–7.7)
Neutro Abs: 22.9 10*3/uL — ABNORMAL HIGH (ref 1.7–7.7)
Neutrophils Relative %: 82 %
Neutrophils Relative %: 81 %
PLATELETS: 395 10*3/uL (ref 150–400)
PLATELETS: 404 10*3/uL — AB (ref 150–400)
RBC: 2.83 MIL/uL — ABNORMAL LOW (ref 4.22–5.81)
RBC: 2.92 MIL/uL — AB (ref 4.22–5.81)
RDW: 17 % — AB (ref 11.5–15.5)
RDW: 17 % — ABNORMAL HIGH (ref 11.5–15.5)
WBC: 17.8 10*3/uL — ABNORMAL HIGH (ref 4.0–10.5)
WBC: 28.2 10*3/uL — AB (ref 4.0–10.5)

## 2016-01-27 MED ORDER — INSULIN ASPART 100 UNIT/ML ~~LOC~~ SOLN
0.0000 [IU] | Freq: Three times a day (TID) | SUBCUTANEOUS | Status: AC
  Administered 2016-01-27: 2 [IU] via SUBCUTANEOUS
  Administered 2016-01-28: 1 [IU] via SUBCUTANEOUS

## 2016-01-27 MED ORDER — TRACE MINERALS CR-CU-MN-SE-ZN 10-1000-500-60 MCG/ML IV SOLN
INTRAVENOUS | Status: AC
  Administered 2016-01-27: 18:00:00 via INTRAVENOUS
  Filled 2016-01-27: qty 1992

## 2016-01-27 MED ORDER — VITAL HIGH PROTEIN PO LIQD: 1000.0000 mL | ORAL | Status: DC

## 2016-01-27 MED ORDER — OSMOLITE 1.2 CAL PO LIQD
1000.0000 mL | ORAL | Status: DC
  Administered 2016-01-27 – 2016-01-28 (×2): 1000 mL
  Filled 2016-01-27 (×2): qty 1000

## 2016-01-27 MED ORDER — SODIUM CHLORIDE 0.9 % IV SOLN
INTRAVENOUS | Status: AC
  Administered 2016-01-27 (×2): via INTRAVENOUS

## 2016-01-27 MED ORDER — MAGNESIUM SULFATE 4 GM/100ML IV SOLN
4.0000 g | Freq: Once | INTRAVENOUS | Status: AC
  Administered 2016-01-27: 4 g via INTRAVENOUS
  Filled 2016-01-27: qty 100

## 2016-01-27 MED ORDER — FAT EMULSION 20 % IV EMUL
240.0000 mL | INTRAVENOUS | Status: AC
  Administered 2016-01-27: 240 mL via INTRAVENOUS
  Filled 2016-01-27: qty 250

## 2016-01-27 NOTE — Progress Notes (Signed)
[facility] Progress Note     Subjective: No complaints this AM, resting comfortably. Patient experienced nausea and emesis with bolus g-tube feeds. He denies abdominal pain this AM. Denies chills, night sweats, or nausea at this time. Urinating without hesitancy. Last BM ~48 h ago. Working with therapies.   Objective: Vital signs in last 24 hours: Temp:  [98.2 F (36.8 C)-98.8 F (37.1 C)] 98.8 F (37.1 C) (12/13 0534) Pulse Rate:  [84-93] 85 (12/13 0534) Resp:  [18] 18 (12/13 0534) BP: (102-162)/(51-65) 102/51 (12/13 0534) SpO2:  [94 %-97 %] 94 % (12/13 0534) Last BM Date:  (patient does not remember)  Intake/Output from previous day: 12/12 0701 - 12/13 0700 In: 1918.2 [I.V.:1818.2; IV Piggyback:100] Out: 2050 [Urine:450; Drains:1600] Intake/Output this shift: No intake/output data recorded.  PE: Gen: Alert, NAD, pleasant and cooperative HEENT: tracheostomy tube in place, now on trach collar  Pulm: unlabored, rhonchi at right lung base, left lung field CTA. Abd: Soft, NT/ND, +BS, g-tube site c/d/i - currently to gravity. Ext: warm and dry, trace pedal edema.   Lab Results:   Recent Labs  01/27/16 0009 01/27/16 0349  WBC 17.8* 28.2*  HGB 9.2* 8.6*  HCT 27.1* 26.5*  PLT 404* 395   BMET  Recent Labs  01/26/16 0449 01/27/16 0349  NA 130* 130*  K 4.1 4.4  CL 99* 100*  CO2 26 25  GLUCOSE 126* 137*  BUN 27* 35*  CREATININE 0.65 0.71  CALCIUM 7.9* 7.8*   PT/INR No results for input(s): LABPROT, INR in the last 72 hours. CMP     Component Value Date/Time   NA 130 (L) 01/27/2016 0349   NA 139 01/14/2016   K 4.4 01/27/2016 0349   CL 100 (L) 01/27/2016 0349   CO2 25 01/27/2016 0349   GLUCOSE 137 (H) 01/27/2016 0349   BUN 35 (H) 01/27/2016 0349   BUN 23 (A) 01/14/2016   CREATININE 0.71 01/27/2016 0349   CALCIUM 7.8 (L) 01/27/2016 0349   PROT 5.7 (L) 01/26/2016 0449   ALBUMIN 1.7 (L) 01/26/2016 0449   AST 58 (H) 01/26/2016 0449   ALT 34  01/26/2016 0449   ALKPHOS 178 (H) 01/26/2016 0449   BILITOT 0.4 01/26/2016 0449   GFRNONAA >60 01/27/2016 0349   GFRAA >60 01/27/2016 0349   Lipase     Component Value Date/Time   LIPASE 55 (H) 01/20/2016 0158   Studies/Results: Dg Chest Port 1 View  Result Date: 01/26/2016 CLINICAL DATA:  75 y/o  M; aspiration. EXAM: PORTABLE CHEST 1 VIEW COMPARISON:  01/21/2016 chest radiograph. FINDINGS: Stable cardiac silhouette given projection and technique. Aortic atherosclerosis with arch calcification. Tracheostomy tube. The PICC line with tip projecting over mid SVC is stable. New ill-defined opacity in the right lung base may represent aspiration. IMPRESSION: New ill-defined opacity in the right lung base may represent aspiration. Electronically Signed   By: Mitzi Hansen M.D.   On: 01/26/2016 22:47   Dg Swallowing Func-speech Pathology  Result Date: 01/25/2016 Objective Swallowing Evaluation: Type of Study: MBS-Modified Barium Swallow Study Patient Details Name: [name] MRN: [id number] Date of Birth: 1940/09/24 Today's Date: 01/25/2016 Time: SLP Start Time (ACUTE ONLY): 1153-SLP Stop Time (ACUTE ONLY): 1215 SLP Time Calculation (min) (ACUTE ONLY): 22 min Past Medical History: Past Medical History: Diagnosis Date . Bilateral dry eyes  . Bile peritonitis due to gastric tube dislodgement 12/03/2015 . BPH (benign prostatic hyperplasia)  . Central pterygium of right eye  . Coronary artery disease 1997, 2009  bare mental stent right coronart artery, occlusive followed by Dr. Garnette ScheuermannHank Smith in [place] . Degenerative disc disease   LOWER BACK . Dysphonia  . ED (erectile dysfunction)  . Gastric outlet obstruction 11/24/2015 . Gastroesophageal reflux disease  . Hyperlipidemia  . Hyperopia of both eyes with astigmatism and presbyopia  . Hypertension  . Nuclear senile cataract   Dr. Bernadette [facility]Hayden [facility] in [place] . Peptic ulcer  . Pneumatosis of intestines 12/11/2015 . Pneumonia   HISTORY  OF IN CHILDHOOD . Posterior vitreous detachment 10/30/2012 . Salzmann's nodular dystrophy of left eye  . Urinary hesitancy  . Wears glasses  Past Surgical History: Past Surgical History: Procedure Laterality Date . CARDIAC CATHETERIZATION   . CHOLECYSTECTOMY   . ESOPHAGOGASTRODUODENOSCOPY N/A 12/01/2015  Procedure: ESOPHAGOGASTRODUODENOSCOPY (EGD) BALLOON DILATION OF DUODENAL STRICTURE;  Surgeon: Karie SodaSteven Gross, MD;  Location: [facility];  Service: General;  Laterality: N/A; . ESOPHAGOGASTRODUODENOSCOPY N/A 12/14/2015  Procedure: ESOPHAGOGASTRODUODENOSCOPY (EGD);  Surgeon: Vida RiggerMarc Magod, MD;  [facility];  Service: Endoscopy;  Laterality: N/A;  Patient has tracheostomy . ESOPHAGOGASTRODUODENOSCOPY (EGD) WITH PROPOFOL N/A 07/01/2015  Procedure: ESOPHAGOGASTRODUODENOSCOPY (EGD) WITH PROPOFOL;  Surgeon: Willis ModenaWilliam Outlaw, MD;  Location: [facility];  Service: Endoscopy;  Laterality: N/A; . ESOPHAGOGASTRODUODENOSCOPY (EGD) WITH PROPOFOL N/A 11/23/2015  Procedure: ESOPHAGOGASTRODUODENOSCOPY (EGD) WITH PROPOFOL;  Surgeon: Willis ModenaWilliam Outlaw, MD;  [facility];  Service: Endoscopy;  Laterality: N/A;  may need to intubate . EYE SURGERY    growth on right eye removed  . IR GENERIC HISTORICAL  12/13/2015  IR REPLC DUODEN/JEJUNO TUBE PERCUT W/FLUORO 12/13/2015 Jolaine ClickArthur Hoss, MD [facility]-INTER[facility] . LAPAROSCOPIC GASTROSTOMY N/A 12/01/2015  Procedure: LAPAROSCOPIC PLACEMENT OF FEEDING JEJUNOSTOMY AND GASTROSTOMY TUBE;  Surgeon: Karie SodaSteven Gross, MD;  Location: [facility];  Service: General;  Laterality: N/A; . LAPAROSCOPY N/A 12/03/2015  Procedure: LAPAROSCOPY DIAGNOSTIC, OMENTOPEXY, JEJUNOSTOMY, WASH OUT;  Surgeon: Karie SodaSteven Gross, MD;  Location: [facility];  Service: General;  Laterality: N/A; . STENT TO HEART    2 1997 AND 1 IN 1996 . TONSILLECTOMY   . XI ROBOTIC VAGOTOMY AND ANTRECTOMY N/A 09/25/2015  Procedure: XI ROBOTIC ANTERIOR AND POSTERIOR VAGOTOMY, BILROTH I  ANASTOMOSIS DOR FUNDIPLICATION, OMENTOPEXY  UPPER ENDOSCOPY;  Surgeon: Karie SodaSteven  Gross, MD;  Location: [facility];  Service: General;  Laterality: N/A; HPI: [name] is a 75 y.o. male with h/o CAD and GERD who presents to the Emergency Department from [facility] by EMS complaining of nausea, vomiting, and diarrhea onset onset two days ago, worsening yesterday. Pt also has associated mild abdominal pain. Per daughter, pt has been admitted to the hospital recently for surgery in his abdomen; he has been staying at [facility] since then. Daughter reported to MD that pt has been fed through a G tube which gave him diarrhea that eventually resolved with a change in formula.   Daughter also informed MD that pt gradually started feeding by mouth again x2 days with return of diarrhea along with nausea and vomiting. Daughter notes that the facility staff have tried to change his formula but with no relief to his symptoms.  Pt has known h/o dysphagia dysphonia, pna, peptic ulcer, GERD.  Last MBS done 12/29/15 showed moderate oropharyngeal dysphagia that looked marginally worse than test ono 12/17/15.  Pt left on ice chips only after oral care.  CXR 12/6 showed LLL consolidation ? ATX or pna.   Subjective: pt awake in chair Assessment / Plan / Recommendation CHL IP CLINICAL IMPRESSIONS 01/25/2016 Therapy Diagnosis Mild oral phase  dysphagia;Moderate pharyngeal phase dysphagia;Mild cervical esophageal phase dysphagia Clinical Impression Clinical ImpressionSwallow function continues to be significantly weak resulting in residuals and aspiration with poor cough mechanism.  Pt again fatigued during MBS stating "I'm tired" after only a few boluses.   Weakness results in secretion retention at pyriform sinus that mixed with barium without consistent awareness.  Compromised tongue base retraction and laryngeal elevation continues to allow laryngeal penetration and both audible and SILENT aspiration both during and after the swallow.  After swallow aspiration due to oropharyngeal residuals  spilling into open airway.  Throat clearing removed trace penetrates from larynx.  Chin tuck posture decreased amount of penetration/aspiration but was not fully preventative.  Attempts to consume solids resulting in pt retching/gagging with oral transiting - cues to expectorate effective.  Pt reports gagging has been occurring with solids.  Patient appeared with decreased proximal esophageal opening/decreased CP opening resulting in residuals in pyriform sinus. Fortunately pt's swallowing ability is improved however he remains weak with inability to fully clear even mild aspirates and is aspirating secretions.   Using teach back/live video educated patient to effective compensation strategies. Recommend continue NPO except single ice chips/water with precautions using chin tuck posture and encouraging pt to "hock" to expectorate vallecular residuals and strengthen cough/voice.  Oral care, pmsv in place and cueing patient to cough/clear throat and re-swallow. Of note, pt did not recall having prior MBS x2 during last hospital admission - concerning for cognitive issues.  Pt will need repeat MBS in future due to silent nature of dysphagia. Will follow up for dysphagia treatment/management.  Thanks for allowing me to help care for this patient.    Impact on safety and function Moderate aspiration risk;Severe aspiration risk;Risk for inadequate nutrition/hydration   CHL IP TREATMENT RECOMMENDATION 01/25/2016 Treatment Recommendations Therapy as outlined in treatment plan below   Prognosis 01/25/2016 Prognosis for Safe Diet Advancement Fair Barriers to Reach Goals Cognitive deficits;Time post onset;Severity of deficits Barriers/Prognosis Comment -- CHL IP DIET RECOMMENDATION 01/25/2016 SLP Diet Recommendations Free water protocol after oral care Liquid Administration via Cup Medication Administration Via alternative means Compensations Slow rate;Small sips/bites;Multiple dry swallows after each bite/sip;Chin tuck;Other  (Comment) Postural Changes Remain semi-upright after after feeds/meals (Comment);Seated upright at 90 degrees   CHL IP OTHER RECOMMENDATIONS 01/25/2016 Recommended Consults Other (Comment) Oral Care Recommendations Oral care BID Other Recommendations Have oral suction available   CHL IP FOLLOW UP RECOMMENDATIONS 01/25/2016 Follow up Recommendations Skilled Nursing facility   [facility] IP FREQUENCY AND DURATION 01/25/2016 Speech Therapy Frequency (ACUTE ONLY) min 2x/week Treatment Duration 1 week      CHL IP ORAL PHASE 01/25/2016 Oral Phase Impaired Oral - Pudding Teaspoon -- Oral - Pudding Cup -- Oral - Honey Teaspoon -- Oral - Honey Cup -- Oral - Nectar Teaspoon Weak lingual manipulation;Delayed oral transit Oral - Nectar Cup Weak lingual manipulation;Delayed oral transit Oral - Nectar Straw Weak lingual manipulation;Delayed oral transit Oral - Thin Teaspoon WFL Oral - Thin Cup WFL Oral - Thin Straw WFL Oral - Puree Weak lingual manipulation;Piecemeal swallowing;Delayed oral transit Oral - Mech Soft Other (Comment) Oral - Regular -- Oral - Multi-Consistency -- Oral - Pill -- Oral Phase - Comment pt reports solid food sensitivity causing him to gag   CHL IP PHARYNGEAL PHASE 01/25/2016 Pharyngeal Phase Impaired Pharyngeal- Pudding Teaspoon -- Pharyngeal -- Pharyngeal- Pudding Cup -- Pharyngeal -- Pharyngeal- Honey Teaspoon -- Pharyngeal -- Pharyngeal- Honey Cup -- Pharyngeal -- Pharyngeal- Nectar Teaspoon Reduced laryngeal elevation;Reduced airway/laryngeal closure;Reduced tongue base retraction;Penetration/Aspiration  during swallow;Pharyngeal residue - valleculae;Pharyngeal residue - pyriform Pharyngeal Material enters airway, remains ABOVE vocal cords and not ejected out Pharyngeal- Nectar Cup Reduced epiglottic inversion;Reduced laryngeal elevation;Reduced airway/laryngeal closure;Reduced tongue base retraction;Penetration/Aspiration during swallow;Pharyngeal residue - valleculae;Pharyngeal residue - pyriform Pharyngeal  Material enters airway, remains ABOVE vocal cords and not ejected out Pharyngeal- Nectar Straw Reduced epiglottic inversion;Reduced laryngeal elevation;Reduced airway/laryngeal closure;Reduced tongue base retraction;Penetration/Aspiration during swallow;Pharyngeal residue - valleculae;Pharyngeal residue - pyriform Pharyngeal Material enters airway, remains ABOVE vocal cords and not ejected out Pharyngeal- Thin Teaspoon Reduced epiglottic inversion;Reduced anterior laryngeal mobility;Reduced laryngeal elevation;Reduced airway/laryngeal closure;Reduced tongue base retraction;Trace aspiration;Pharyngeal residue - valleculae;Pharyngeal residue - pyriform Pharyngeal Material enters airway, passes BELOW cords without attempt by patient to eject out (silent aspiration) Pharyngeal- Thin Cup Reduced epiglottic inversion;Reduced laryngeal elevation;Reduced airway/laryngeal closure;Reduced tongue base retraction;Penetration/Aspiration during swallow;Penetration/Apiration after swallow;Trace aspiration;Significant aspiration (Amount);Pharyngeal residue - valleculae;Pharyngeal residue - pyriform Pharyngeal Material enters airway, passes BELOW cords and not ejected out despite cough attempt by patient;Material enters airway, passes BELOW cords without attempt by patient to eject out (silent aspiration) Pharyngeal- Thin Straw Reduced pharyngeal peristalsis;Reduced epiglottic inversion;Reduced laryngeal elevation;Reduced airway/laryngeal closure;Reduced tongue base retraction;Trace aspiration;Pharyngeal residue - valleculae;Pharyngeal residue - pyriform Pharyngeal -- Pharyngeal- Puree Delayed swallow initiation-vallecula;Reduced epiglottic inversion;Reduced anterior laryngeal mobility;Reduced laryngeal elevation;Reduced tongue base retraction;Pharyngeal residue - valleculae Pharyngeal -- Pharyngeal- Mechanical Soft -- Pharyngeal -- Pharyngeal- Regular NT Pharyngeal -- Pharyngeal- Multi-consistency -- Pharyngeal -- Pharyngeal- Pill  -- Pharyngeal -- Pharyngeal Comment chin tuck posture decreased amount of penetration/aspiration but did not fully eliminate it, cough was weak and did not fully clear trace aspirates, pt initially with audible aspiration but later it was silent, following solids with thin faciliated pharyngeal clearance but with aspiration of secretions/liquids=   CHL IP CERVICAL ESOPHAGEAL PHASE 01/25/2016 Cervical Esophageal Phase Impaired Pudding Teaspoon -- Pudding Cup -- Honey Teaspoon -- Honey Cup -- Nectar Teaspoon Reduced cricopharyngeal relaxation Nectar Cup Reduced cricopharyngeal relaxation Nectar Straw -- Thin Teaspoon Reduced cricopharyngeal relaxation Thin Cup Reduced cricopharyngeal relaxation Thin Straw Reduced cricopharyngeal relaxation Puree Reduced cricopharyngeal relaxation;Esophageal backflow into cervical esophagus Mechanical Soft -- Regular -- Multi-consistency -- Pill -- Cervical Esophageal Comment -- No flowsheet data found. Donavan Burnet, MS [facility] SLP [phone]               Anti-infectives: Anti-infectives    Start     Dose/Rate Route Frequency Ordered Stop   01/26/16 2300  piperacillin-tazobactam (ZOSYN) IVPB 3.375 g     3.375 g 12.5 mL/hr over 240 Minutes Intravenous Every 8 hours 01/26/16 2256     01/26/16 2230  vancomycin (VANCOCIN) IVPB 1000 mg/200 mL premix  Status:  Discontinued     1,000 mg 200 mL/hr over 60 Minutes Intravenous Every 12 hours 01/26/16 2227 01/26/16 2227   01/20/16 0400  vancomycin (VANCOCIN) IVPB 1000 mg/200 mL premix  Status:  Discontinued     1,000 mg 200 mL/hr over 60 Minutes Intravenous Every 12 hours 01/20/16 0323 01/21/16 0900   01/20/16 0330  ceFEPIme (MAXIPIME) 1 g in dextrose 5 % 50 mL IVPB     1 g 100 mL/hr over 30 Minutes Intravenous  Once 01/20/16 0317 01/20/16 0735     Assessment/Plan PSBO with 2 days of nausea and vomiting on admit 01/20/16 - S/P Robotic anterior and posterior vagotomy, BI anastomosis, DOR fundoplication, omentopexy for partial GOO  09/2015, DR. Viviann Spare GROSS  - Feeding Jejunostomy and gastrostomy/EGD balloon dilatation, secondary to edema at anastomosis, 12/01/15, DR. Karie Soda - S/p diagnostic laparoscopy, omentopexy of jejunal  disruption, and washout for jejunal disruption (pt pulled out jejunostomy tube)and gastric leak. 12/03/15, Dr. Karie SodaSteven Gross (pt pulled out jejunostomy tube) - Malnutrition formerly on TF/now on TNA  - bowel function returned, continue dulcolax PRN  Aspiration PNA  - aspiration w/ start of bolus TF 12/12 - WBC 28.2 on Zosyn per pharmacy  - CBC in AM  FEN: NPO/TNA, restart G-tube feeds per nutrition ID: cefepime 12/6 x1, vancomycin 12/6-12/7, Zosyn 12/13 >>  DVT: Heparin/SCD   Plan:  Swallow eval recommending NPO except ice chips and limited sips of water with chin tuck.  Recommend continuing TPN. Patient did not tolerate bolus tube feeds >> emesis and aspiration now on Zosyn. Suspect due to slow gastric emptying. Will try trickle tube feeds. Will discuss use of Reglan with MD. Family requesting not to go back to [facility]  Downsize/decannulation oftrach per CCM    LOS: 7 days    Adam PhenixElizabeth S Nitzia Perren , [facility] 01/27/2016, 7:53 AM Pager: [phone] Consults: [phone] Mon-Fri 7:00 am-4:30 pm Sat-Sun 7:00 am-11:30 am

## 2016-01-27 NOTE — Progress Notes (Signed)
At around 2145, Mr. [name] complained of chills. On assessment noticed low grade temperature of 99.1.  Patient started to vomit brown emesis of large amount.  RT notified at that time, and she  took over the respiratory care.  Dr. Johna SheriffHoxworth was then notified of the event of patient respiratory being labored and rapid to 28/mn.  Orders received and implemented.

## 2016-01-27 NOTE — Progress Notes (Addendum)
[name] - ADULT TOTAL PARENTERAL NUTRITION CONSULT NOTE   Pharmacy Consult for TPN Indication: PTA TPN continuation, intolerance to enteral feed  Insulin Requirements: 1 unit Novolog yesterday  Current Nutrition: Clinimix as below  PT[facility] TPN [facility]: [facility] 405 grams (1376 kcal)  of dextrose, 75 grams (300 kcal) of protein and 50 grams ( 480 kcal) of lipid for a total of 2156 kcal/day  Osmolite 1.2 CAL via FT BID ordered 12/11 at 6PM  IVF: NS + 20 meq KCl at 100 ml/hr  Central access: PICC TPN start date: PTA, restarted on admission on 12/6   ASSESSMENT                                                                                                          HPI: 89 yoM with hx PUD, CAD, GERD, BPH,  s/p multiple procedures  and extended hospitalization for recurrent partial GOO 2/2 pyloric stricture on TPN for several days, discharged 11/15 with J/G tube in place on TFs, unable to tolerate and had to transition back to TPN [facility] on 11/22, readmitted 12/6 with partial SBO. Pharmacy consulted to resume TPN.  Significant events:  12/6: TPN continued on admission. [facility]: Pt pulled out NGT, not replaced, remains NPO 12/8: trial of ice chips 12/10: continue TPN, plan swallow study Monday 12/11, if no aspiration, will start to advance diet.   12/11: Awaiting MBS.  Having bowel function.  Continue TPN for now.   12/12: Severe aspiration risk per MBS yesterday - recommend continuing NPO. Osmolite 1.2 CAL via TF BID ordered 12/11 at 6pm. Per discussion with CCS PA, continue TPN at current goal rate today. F/u for ability to wean tomorrow if tolerates TF. 12/13: did not tolerate bolus feeds through G-tube; CCS trying trickle feeds today.  Today:   Glucose - No hx DM noted, CBGs at goal - SSI q6h  Electrolytes - Na remains low but stable, Cl sl low; Mg initially improved after repletion yesterday, now back to original level.  Others WNL  Renal - SCr WNL  LFTs - AST/ALT, AlkPhos rising  TGs - 177 ( 11/27), 93 (12/7), 119 (12/11)  Prealbumin - 15.3 ( 11/13), 10.8 (12/7), 12.8 (12/11)  NUTRITIONAL GOALS                                                                                             RD recs (12/7): Kcal 2000-2200, Protein 100-115g, Fluid 2L/day  [facility] TPN: 2156 Kcal/day, Protein 75g/day  Clinimix E 5/15 at a goal rate of 90 ml/hr + 20% fat emulsion at  10 ml/hr to provide: 108 g/day protein,  2013 Kcal/day.  **There is currently a Acupuncturistnational backorder of Clinimix solution. To conserve supply, will keep goal rate of Clinimix 5/15 at 83 ml/hr + 20% fat emulsion at 5910ml/hr. This provides 100g/day protein, 1894 Kcal/day.  PLAN                                                                                                                           Mg 4g IV x 1  Will remove KCl from IVF as level is > 4 and rising, plus lytes in TPN  At 1800 today:  Continue Clinimix E 5/15 at 83 ml/hr; can consider weaning once tolerating trickle feeds. May also want to decrease rate if LFTs continue to rise unless Surgery aware of alternative etiology.  Continue 20% fat emulsion at 10 ml/hr.  TPN to contain standard multivitamins and trace elements.  Liberalize sensitive SSI coverage to q8h  TPN lab panels on Mondays & Thursdays  IVF per CCS  F/u tolerance of tube feedings, ability to wean/stop TPN  Bernadene Personrew Demetric Parslow, PharmD, BCPS Pager: [phone] 01/27/2016, 10:25 AM

## 2016-01-27 NOTE — Progress Notes (Signed)
Pharmacy Antibiotic Note  Ardyth GalRalph T Giuliani is a 75 y.o. male admitted on 01/19/2016 with pneumonia.  Pharmacy has been consulted for zosyn dosing.  Plan: Zosyn 3.375g IV q8h (4 hour infusion).  Height: 6\' 2"  (188 cm) Weight: 175 lb 9.6 oz (79.7 kg) IBW/kg (Calculated) : 82.2  Temp (24hrs), Avg:98.2 F (36.8 C), Min:98.2 F (36.8 C), Max:98.3 F (36.8 C)   Recent Labs Lab 01/21/16 0437 01/22/16 0511 01/25/16 0412 01/26/16 0449 01/27/16 0009  WBC 6.9  --  7.7  --  17.8*  CREATININE 0.65 0.62 0.58* 0.65  --     Estimated Creatinine Clearance: 88.6 mL/min (by C-G formula based on SCr of 0.65 mg/dL).    Allergies  Allergen Reactions  . Flomax [Tamsulosin Hcl] Other (See Comments)    Leg weakness  . Lisinopril Cough  . Lorazepam     "i don't like it"  Prefers Xanax or valium  . Uroxatral [Alfuzosin Hcl Er] Other (See Comments)    Leg weakness    Antimicrobials this admission: Zosyn 01/27/2016 >>  Cefepime 12/6 x1 Vancomycin 12/6 >> 12/7   Dose adjustments this admission: -  Microbiology results:   Thank you for allowing pharmacy to be a part of this patient's care.  Aleene DavidsonGrimsley Jr, Symiah Nowotny Crowford 01/27/2016 3:23 AM

## 2016-01-27 NOTE — Progress Notes (Addendum)
[facility]  Subjective:  Chart review notes episode of vomiting overnight > vomiting brown emesis that smelled like stool, secretions coming out of trach, sats dropped to 85% with improvement after O2. Concern for aspiration, cap removed per RT  Objective: Vitals:   01/26/16 2059 01/27/16 0534 01/27/16 1010 01/27/16 1012  BP: (!) 162/65 (!) 102/51    Pulse: 93 85 87   Resp: 18 18 18    Temp: 98.3 F (36.8 C) 98.8 F (37.1 C)    TempSrc: Oral Oral    SpO2: 94% 94% 98% 98%  Weight:      Height:         Exam: Awake, alert, resting quietly in bed, no distress Tracheostomy in place, no drainage nearby, PMV in place  Lungs clear with normal effort, occasional cough Cardiovascular regular rate and rhythm no murmurs gallops or rubs Neurologic: No distress, spontaneous movement of ext's    01/21/2016 chest x-ray with very mild left lower lobe airspace disease 12/11 SLP Eval >> mild oral phase dysphagia, moderate pharyngeal phase dysphagia, mild cervical esophageal dysphagia   Impression: Tracheostomy status Small bowel obstruction  Discussion: [name] is well-known to our service from a recent ICU stay where he required tracheostomy placement. He's been doing well at SNF with some cough and mucus production. He's been stable overall from a respiratory standpoint. Although, SLP eval with modified barium swallow 12/11 he was noted to still have some degree of pharyngeal phase aspiration. The tracheostomy is likely not helping in regards to his aspiration. Given his normal ventilatory mechanics in oxygenation it's reasonable to consider decannulation. Trach capped 12/12 0900.  Pt tolerated the trial of trach capping from a respiratory standpoint.  Unfortunately, he had episode of repeat vomiting (which is what brought him in this admit).  Recurrent GI issues puts him at increased risk of aspiration / vent needs and as such would not decannulate him until these are sorted  out.     Plan: No decannulation at this time > discussed trach issues with wife, she indicates understanding ATC as needed to support sats >92% Defer N/V & feeding issues to CCS  Continue zosyn per primary MD Continue swallowing evaluation and management per primary service and speech therapy Recommend continuing PMV during daytime for now, off at night Follow up in trach clinic in one month (arranged) for review for possible decannulation   PCCM will be available PRN.  Please call if new needs arise.    Danny BrimBrandi Ollis, NP-C [facility] Pgr: [phone] or if no answer [phone] 01/27/2016, 12:28 PM   Attending:  Overnight Mr. [name] had what sounds like an aspiration event in the setting of vomiting. The trach was uncapped at that point and he was suctioned. He is resting comfortably this morning.  On exam: Lungs are again clear to auscultation with normal respiratory effort, belly soft nontender, he sleepy.  Impression/plan Tracheostomy status: Given his overall tenuous status in that he's required multiple hospitalizations since his surgery and with last night's episode of aspiration while he does not have a significant ongoing pulmonary problem his risk of recurrent respiratory failure is exceedingly high. At this point I see no indication to decannulate him.  Explained this to his wife at length.  > 50% of this 27 minute visit face to face  Danny CarolinaBrent Jessica Seidman, MD [facility] Pager: [phone] Cell: [phone] After 3pm or if no response, call [phone]

## 2016-01-27 NOTE — Progress Notes (Signed)
Brief Pharmacy Note re: TPN  Noted MD order to change to cyclic TPN at night.  TPN bag for 12/13 already hung and will run until 12/14 at 6pm.   Will plan to start cyclic TPN on 12/14 PM.    Junita PushMichelle Avonte Sensabaugh, PharmD, BCPS Pager: [phone] 01/27/2016@7 :52 PM

## 2016-01-27 NOTE — Progress Notes (Signed)
Physical Therapy Treatment Patient Details Name: Danny GalRalph T Masih MRN: [id number] DOB: [birth year] Today's Date: 01/27/2016    [facility] of Present Illness [facility] N/V r/o ileus    PT Comments    Arrived to room to find pt back on trach collar 35% 8 lts.  Per chart review pt had an episode of aspiration when he vomited after receiving Jevity Tube feeding.  Sitting EOB  O2 avg 99%.  [facility] just in case he needed to go, then amb in hallway a greater distance.  Amb on RA sats avg 92% with HR 102.  With RN permission, left pt on RA with pulse Ox connected. Spouse present and very helpful by pushing recliner behind as pt amb.   Follow Up Recommendations  SNF     Equipment Recommendations  None recommended by PT    Recommendations for Other Services       Precautions / Restrictions Precautions Precautions: Fall Precaution Comments: NPO (X ice chips), gastric tube Restrictions Weight Bearing Restrictions: No    Mobility  Bed Mobility Overal bed mobility: Needs Assistance Bed Mobility: Supine to Sit       Sit to supine: Min guard   General bed mobility comments: able to self perform with increased time  Transfers Overall transfer level: Needs assistance Equipment used: None Transfers: Sit to/from [employer]Stand;Stand Pivot Transfers Sit to Stand: Min assist;+2 safety/equipment;Min guard Stand pivot transfers: Min assist;Min guard       General transfer comment: [facility] with 50% VC's on proper hand placement and transition esp to reach back prior to sit  Ambulation/Gait Ambulation/Gait assistance: Min assist;+2 safety/equipment Ambulation Distance (Feet): 120 Feet Assistive device: Bilateral platform walker Gait Pattern/deviations: Step-to pattern Gait velocity: decreased   General Gait Details: amb with B platform walker for increased support and to increase distance.  Amb on RA avg sats 92% and HR 102.  Third assist to  follow with recliner as pt tends to sit quickly due to fatigue.     Stairs            Wheelchair Mobility    Modified Rankin (Stroke Patients Only)       Balance                                    Cognition Arousal/Alertness: Awake/alert Behavior During Therapy: WFL for tasks assessed/performed Overall Cognitive Status: Within Functional Limits for tasks assessed                      Exercises      General Comments        Pertinent Vitals/Pain      Home Living                      Prior Function            PT Goals (current goals can now be found in the care plan section)      Frequency    Min 3X/week      PT Plan Current plan remains appropriate    Co-evaluation             End of Session Equipment Utilized During Treatment: Gait belt Activity Tolerance: Patient tolerated treatment well Patient left: in chair;with call bell/phone within reach;with family/visitor present     Time: [id number] PT Time Calculation (  min) (ACUTE ONLY): 44 min  Charges:  $Gait Training: 23-37 mins $Therapeutic Activity: 8-22 mins                    G Codes:      Felecia ShellingLori Sondra Blixt  PTA [facility]  Acute  Rehab Pager      [phone]

## 2016-01-27 NOTE — Progress Notes (Addendum)
Nutrition Follow-up  DOCUMENTATION CODES:   Severe malnutrition in context of acute illness/injury  INTERVENTION:   TPN per Pharmacy   Continue Osmolite 1.5 @ 10 ml/hr via G-tube (288 kcal, 13g protein).  Will monitor for tolerance and advancement.  D/c Osmolite 1.5 bolus order.  Goal rate: Osmolite 1.2 @ 70 ml/hr. Provides 2016 kcal (100% of needs), 93g protein (93% of needs) and 1377 ml H2O.  NUTRITION DIAGNOSIS:   Inadequate oral intake related to inability to eat as evidenced by NPO status.  Ongoing.  GOAL:   Patient will meet greater than or equal to 90% of their needs  Meeting with TPN.  MONITOR:   Labs, Weight trends, TF tolerance, Skin, I & O's, Other (Comment) (TPN)  ASSESSMENT:   75 year-old male with a medical history of GERD, PUD, and CAD who presented to [facility] EMS with a cc of nausea, vomiting, and diarrhea. Patient is s/p Robotic anterior and posterior vagotomy, BI anastomosis, DOR fundoplication, omentopexy 09/2015 for partial GOO secondary to pyloric stricture, followed by placement of feeding jejunostomy and gastrostomy tube and EGD balloon dilation of duodenal stricture 12/01/15 for recurrent GOO secondary to edema at anastomosis and failure to thrive with malnutrition. On 12/03/15 the patient was taken to the OR for a diagnostic laparoscopy, omentopexy of jejunal disruption, and washout for jejunal disruption (pt pulled out jejunostomy tube) and gastric leak.   Pt did not tolerate bolus feeds per discussion with RN that worked with pt yesterday 12/12. Per RN, pt was provided 1 can of Osmolite 1.5. Within 30 minutes, pt's wife brought some food into the room and the patient c/o the smell from the food making him nauseous. Pt then began to vomit. Pt reports feelings of fullness. RN states maybe 1/2 the can came back up and tube feeding appeared around his capped trach. No more tube feeding was provided yesterday.  Pt  has been started on continuous feeds of Osmolite 1.2 at 10 ml/hr. Will calculate goal rate for continuous infusion. Will advance slowly to ensure tolerance.  Spoke with Pharmacy, recommended no change in TPN today. Discussed increasing K lab values. Pharmacist states they will d/c IVF w/ KCl. Pt continues to receive Clinimix E 5/15 @ 83 ml/hr + 20% ILE @ 10 ml/hr. This regimen provides 1992 ml fluid, 1897 kcal (95% minimum estimated kcal needs), 100 grams protein daily (100% minimum estimated protein needs).  Medications: Mg sulfate IV once, IV Protonix every 12 hours Labs reviewed: CBGs: 112-122 Low Na, Mg K trending up  Diet Order:  Diet NPO time specified Except for: Ice Chips, Other (See Comments) TPN (CLINIMIX-E) Adult TPN (CLINIMIX-E) Adult  Skin:    Stage II sacral pressure injury  Last BM:  12/8  Height:   Ht Readings from Last 1 Encounters:  01/21/16 6\' 2"  (1.88 m)    Weight:   Wt Readings from Last 1 Encounters:  01/21/16 175 lb 9.6 oz (79.7 kg)    Ideal Body Weight:  86.4 kg  BMI:  Body mass index is 22.55 kg/m.  Estimated Nutritional Needs:   Kcal:  2000-2200  Protein:  100-115g  Fluid:  2L/day  EDUCATION NEEDS:   No education needs identified at this time  Tilda FrancoLindsey Oyindamola Key, MS, RD, LDN Pager: [phone] After Hours Pager: [phone]

## 2016-01-28 ENCOUNTER — Inpatient Hospital Stay (HOSPITAL_COMMUNITY): Payer: Medicare Other

## 2016-01-28 LAB — COMPREHENSIVE METABOLIC PANEL
ALK PHOS: 146 U/L — AB (ref 38–126)
ALT: 20 U/L (ref 17–63)
ANION GAP: 3 — AB (ref 5–15)
AST: 18 U/L (ref 15–41)
Albumin: 1.5 g/dL — ABNORMAL LOW (ref 3.5–5.0)
BUN: 31 mg/dL — ABNORMAL HIGH (ref 6–20)
CALCIUM: 7.6 mg/dL — AB (ref 8.9–10.3)
CHLORIDE: 101 mmol/L (ref 101–111)
CO2: 25 mmol/L (ref 22–32)
Creatinine, Ser: 0.6 mg/dL — ABNORMAL LOW (ref 0.61–1.24)
GFR calc non Af Amer: >60 mL/min (ref >60)
Glucose, Bld: 135 mg/dL — ABNORMAL HIGH (ref 65–99)
POTASSIUM: 4.1 mmol/L (ref 3.5–5.1)
SODIUM: 129 mmol/L — AB (ref 135–145)
Total Bilirubin: 0.4 mg/dL (ref 0.3–1.2)
Total Protein: 5.3 g/dL — ABNORMAL LOW (ref 6.5–8.1)

## 2016-01-28 LAB — PHOSPHORUS: PHOSPHORUS: 3.7 mg/dL (ref 2.5–4.6)

## 2016-01-28 LAB — CBC
HEMATOCRIT: 23.2 % — AB (ref 39.0–52.0)
Hemoglobin: 7.5 g/dL — ABNORMAL LOW (ref 13.0–17.0)
MCH: 29.9 pg (ref 26.0–34.0)
MCHC: 32.3 g/dL (ref 30.0–36.0)
MCV: 92.4 fL (ref 78.0–100.0)
Platelets: 340 10*3/uL (ref 150–400)
RBC: 2.51 MIL/uL — AB (ref 4.22–5.81)
RDW: 17.5 % — AB (ref 11.5–15.5)
WBC: 11.3 10*3/uL — AB (ref 4.0–10.5)

## 2016-01-28 LAB — GLUCOSE, CAPILLARY
GLUCOSE-CAPILLARY: 110 mg/dL — AB (ref 65–99)
Glucose-Capillary: 110 mg/dL — ABNORMAL HIGH (ref 65–99)
Glucose-Capillary: 114 mg/dL — ABNORMAL HIGH (ref 65–99)
Glucose-Capillary: 113 mg/dL — ABNORMAL HIGH (ref 65–99)

## 2016-01-28 LAB — MAGNESIUM: Magnesium: 2.1 mg/dL (ref 1.7–2.4)

## 2016-01-28 MED ORDER — BISACODYL 5 MG PO TBEC: 5.0000 mg | DELAYED_RELEASE_TABLET | Freq: Every day | ORAL | Status: DC | PRN

## 2016-01-28 MED ORDER — FAT EMULSION 20 % IV EMUL
240.0000 mL | INTRAVENOUS | Status: AC
  Administered 2016-01-28: 240 mL via INTRAVENOUS
  Filled 2016-01-28: qty 250

## 2016-01-28 MED ORDER — BISACODYL 10 MG RE SUPP
10.0000 mg | Freq: Every day | RECTAL | Status: DC
  Administered 2016-01-28 – 2016-02-06 (×8): 10 mg via RECTAL
  Filled 2016-01-28 (×10): qty 1

## 2016-01-28 MED ORDER — TRACE MINERALS CR-CU-MN-SE-ZN 10-1000-500-60 MCG/ML IV SOLN
INTRAVENOUS | Status: AC
  Administered 2016-01-28: 18:00:00 via INTRAVENOUS
  Filled 2016-01-28: qty 1988

## 2016-01-28 MED ORDER — SODIUM CHLORIDE 0.9 % IV SOLN
INTRAVENOUS | Status: AC
  Administered 2016-01-28: 1000 mL via INTRAVENOUS
  Administered 2016-01-29 – 2016-01-30 (×3): via INTRAVENOUS

## 2016-01-28 MED ORDER — INSULIN ASPART 100 UNIT/ML ~~LOC~~ SOLN: 0.0000 [IU] | SUBCUTANEOUS | Status: DC

## 2016-01-28 NOTE — Progress Notes (Signed)
[name] - ADULT TOTAL PARENTERAL NUTRITION CONSULT NOTE   Pharmacy Consult for TPN Indication: PTA TPN continuation, intolerance to enteral feed  Insulin Requirements: 2 units Novolog SSI yesterday  Current Nutrition:  - Clinimix as below  - Osmolyte 1.2 Cal at 10 ml/hr PTA TPN from advanced home care: [facility] pt received 405 grams (1376 kcal)  of dextrose, 75 grams (300 kcal) of protein and 50 grams ( 480 kcal) of lipid for a total of 2156 kcal/day  Osmolite 1.2 CAL via FT BID ordered 12/11 at 6PM  IVF: NS at 100 ml/hr  Central access: PICC TPN start date: PTA, restarted on admission on 12/6   ASSESSMENT                                                                                                          HPI: [age] yoM with hx PUD, CAD, GERD, BPH,  s/p multiple procedures  and extended hospitalization for recurrent partial GOO 2/2 pyloric stricture on TPN for several days, discharged 11/15 with J/G tube in place on TFs, unable to tolerate and had to transition back to [facility] on 11/22, readmitted 12/6 with partial SBO. Pharmacy consulted to resume TPN.  Significant events:  12/6: TPN continued on admission. Re[facility] recipe from [facility] 12/7: Pt pulled out NGT, not replaced, remains NPO 12/8: trial of ice chips 12/10: continue TPN, plan swallow study Monday 12/11, if no aspiration, will start to advance diet.   12/11: Awaiting MBS.  Having bowel function.  Continue TPN for now.   12/12: Severe aspiration risk per MBS yesterday - recommend continuing NPO. Osmolite 1.2 CAL via TF BID ordered 12/11 at 6pm. Per discussion with CCS PA, continue TPN at current goal rate today. F/u for ability to wean tomorrow if tolerates TF. 12/13: Did not tolerate bolus feeds through G-tube; overnight emesis and aspiration event, started on Zosyn. CCS trying trickle feeds today. 12/14 transition to cyclic TPN - appears recovery may be prolonged  Today:   Glucose - No hx DM noted, CBGs at  goal (max 126) - SSI q8h  Electrolytes - Na remains low but stable, K, Mg at goal after repletion; others WNL  Renal - SCr WNL  LFTs - AST/ALT, AlkPhos back down today after rising yesterday; appear to be rising and falling periodically  TGs - 177 ( 11/27), 93 (12/7), 119 (12/11)  Prealbumin - 15.3 ( 11/13), 10.8 (12/7), 12.8 (12/11)  NUTRITIONAL GOALS                                                                                             RD recs (12/7): Kcal 2000-2200, Protein 100-115g, Fluid 2L/day  [facility] TPN: 2156 Kcal/day, Protein 75g/day  Clinimix E 5/15 at a goal rate of 90 ml/hr + 20% fat emulsion at  10 ml/hr to provide: 108 g/day protein, 2013 Kcal/day.  **There is currently a Acupuncturistnational backorder of Clinimix solution. To conserve supply, will keep goal rate of Clinimix 5/15 at 83 ml/hr + 20% fat emulsion at 5810ml/hr. This provides 100g/day protein, 1894 Kcal/day.  PLAN                                                                                                                           At 1800 today: begin 18-hr TPN cycle  Clinimix E 5/15, 2000 ml given over 18 hrs. Since patient currently on 24 hr TPN, no ramp-up phase needed at beginning of this cycle:  1800 tonight: start bag at 114 ml/hr x 17 hr  1100 tomorrow: reduce rate to 50 ml/hr x 1 hr, then off  20% fat emulsion at 13.3 ml/hr x 18 hr  TPN to contain standard multivitamins and trace elements.  CBG coverage per cyclic TPN guidelines  2000 tonight: 2-hr after starting cyclic rate  0600 tomorrow: on cycle  1300 tomorrow: 1-hr after stopping cycle  1700 tomorrow: off cycle  TPN lab panels on Mondays & Thursdays, will reorder CMP, Mg for Saturday AM  Will reduce NS to 80 ml/hr to keep total intake < 200 ml/hr while TPN cycling  F/u tolerance of tube feedings, ability to wean/stop TPN  Bernadene Personrew Emmanual Gauthreaux, PharmD, BCPS Pager: [phone] 01/28/2016, 7:52 AM

## 2016-01-28 NOTE — Clinical Social Work Note (Signed)
MSW met with patient, pt's wife and dtr, [name] at bedside in regards to discharge planning. Patient's family is not interested in patient returning to [facility] and shared extensive list of concerns. MSW presented other possible options for SNF's which provide trach care. Patient's family reported that they are not interested in [facility] or [facility].  Pt's wife shared that she and patient are on waiting list for placement at [facility]. Wife would like MSW to look into SNF placement at [facility], MSW placed call to [facility] and left message for returned phone call (unsure if [facility] manages trach's).   RNCM processed [facility] insurance for [facility] however received denial, information shared with family at bedside. Pt's dtr, [name] plans to contact [facility] in regards to denial.   Pt lying in bed watching tv during meeting. Pt's family pleasant and appreciated social work intervention.   MSW will continue to follow pt and family for continued support and to facilitate pt's dc needs once medically stable.   Glendon Axe, MSW [phone] 01/28/2016 3:03 PM

## 2016-01-28 NOTE — Progress Notes (Signed)
[facility] Progress Note     Subjective: Daughter at bedside. Some abdominal discomfort. Denies chills, nausea, vomiting, or SOB. Has not had a BM in 2-3 days.   Objective: Vital signs in last 24 hours: Temp:  [97.7 F (36.5 C)-98.5 F (36.9 C)] 97.7 F (36.5 C) (12/14 0500) Pulse Rate:  [65-89] 75 (12/14 0827) Resp:  [16-18] 16 (12/14 0827) BP: (115-124)/(40-51) 115/51 (12/14 0500) SpO2:  [95 %-99 %] 99 % (12/14 0827) FiO2 (%):  [28 %] 28 % (12/14 0827) Weight:  [80.1 kg (176 lb 9.6 oz)-81.6 kg (179 lb 14.4 oz)] 80.1 kg (176 lb 9.6 oz) (12/14 0500) Last BM Date:  (patient does not remember)  Intake/Output from previous day: 12/13 0701 - 12/14 0700 In: 5561.5 [I.V.:3084.8; NG/GT:2176.7] Out: 1350 [Urine:1150; Drains:200] Intake/Output this shift: No intake/output data recorded.  PE: Gen:  Alert, NAD, pleasant Pulm: unlabored, CTA, no W/R/R, coughing Abd: Soft, NT, mild distention, hypoactive BS, laparoscopic surgical scars noted. g-tube site c/d/i.  Lab Results:   Recent Labs  01/27/16 0349 01/28/16 0438  WBC 28.2* 11.3*  HGB 8.6* 7.5*  HCT 26.5* 23.2*  PLT 395 340   BMET  Recent Labs  01/27/16 0349 01/28/16 0438  NA 130* 129*  K 4.4 4.1  CL 100* 101  CO2 25 25  GLUCOSE 137* 135*  BUN 35* 31*  CREATININE 0.71 0.60*  CALCIUM 7.8* 7.6*   PT/INR No results for input(s): LABPROT, INR in the last 72 hours. CMP     Component Value Date/Time   NA 129 (L) 01/28/2016 0438   NA 139 01/14/2016   K 4.1 01/28/2016 0438   CL 101 01/28/2016 0438   CO2 25 01/28/2016 0438   GLUCOSE 135 (H) 01/28/2016 0438   BUN 31 (H) 01/28/2016 0438   BUN 23 (A) 01/14/2016   CREATININE 0.60 (L) 01/28/2016 0438   CALCIUM 7.6 (L) 01/28/2016 0438   PROT 5.3 (L) 01/28/2016 0438   ALBUMIN 1.5 (L) 01/28/2016 0438   AST 18 01/28/2016 0438   ALT 20 01/28/2016 0438   ALKPHOS 146 (H) 01/28/2016 0438   BILITOT 0.4 01/28/2016 0438   GFRNONAA >60 01/28/2016 0438   GFRAA >60 01/28/2016 0438   Lipase     Component Value Date/Time   LIPASE 55 (H) 01/20/2016 0158       Studies/Results: Dg Chest Port 1 View  Result Date: 01/26/2016 CLINICAL DATA:  75 y/o  M; aspiration. EXAM: PORTABLE CHEST 1 VIEW COMPARISON:  01/21/2016 chest radiograph. FINDINGS: Stable cardiac silhouette given projection and technique. Aortic atherosclerosis with arch calcification. Tracheostomy tube. The PICC line with tip projecting over mid SVC is stable. New ill-defined opacity in the right lung base may represent aspiration. IMPRESSION: New ill-defined opacity in the right lung base may represent aspiration. Electronically Signed   By: Mitzi HansenLance  Furusawa-Stratton M.D.   On: 01/26/2016 22:47    Anti-infectives: Anti-infectives    Start     Dose/Rate Route Frequency Ordered Stop   01/26/16 2300  piperacillin-tazobactam (ZOSYN) IVPB 3.375 g     3.375 g 12.5 mL/hr over 240 Minutes Intravenous Every 8 hours 01/26/16 2256     01/26/16 2230  vancomycin (VANCOCIN) IVPB 1000 mg/200 mL premix  Status:  Discontinued     1,000 mg 200 mL/hr over 60 Minutes Intravenous Every 12 hours 01/26/16 2227 01/26/16 2227   01/20/16 0400  vancomycin (VANCOCIN) IVPB 1000 mg/200 mL premix  Status:  Discontinued     1,000 mg 200 mL/hr over  60 Minutes Intravenous Every 12 hours 01/20/16 0323 01/21/16 0900   01/20/16 0330  ceFEPIme (MAXIPIME) 1 g in dextrose 5 % 50 mL IVPB     1 g 100 mL/hr over 30 Minutes Intravenous  Once 01/20/16 0317 01/20/16 0735       Assessment/Plan PSBO with 2 days of nausea and vomiting on admit 01/20/16 - S/P Robotic anterior and posterior vagotomy, BI anastomosis, DOR fundoplication, omentopexy for partial GOO 09/2015, DR. Viviann SpareSTEVEN GROSS  - Feeding Jejunostomy and gastrostomy/EGD balloon dilatation, secondary to edema at anastomosis, 12/01/15, DR. Karie SodaSteven Gross - S/p diagnostic laparoscopy, omentopexy of jejunal disruption, and washout for jejunal disruption (pt pulled out  jejunostomy tube)and gastric leak. 12/03/15, Dr. Karie SodaSteven Gross (pt pulled out jejunostomy tube) - Malnutrition formerly on TF/now on TNA  - bowel function returned, continue dulcolax PRN  Aspiration PNA  - aspiration w/ start of bolus TF 12/12 - WBC 11.2, trending down on Zosyn  - CBC in AM  FEN: NPO, cyclic TPN, trickle tube feeds since tolerating? ID: cefepime 12/6 x1, vancomycin 12/6-12/7, Zosyn 12/13 >>  DVT: Heparin/SCD   Plan: NPO, TPN/trickle feeds, patient placement  Recommend continuing TPN. Patient did not tolerate bolus tube feeds but has tolerated trickle TF for 24h. Family requesting [facility] to [facility] - Social work to evaluate today. SNF vs [facility]? Downsize/decannulation of trach delayed due to aspiration risk - outpatient follow up in 4 weeks to re-evaluate    LOS: 8 days    Adam PhenixElizabeth S Simaan , [facility] 01/28/2016, 9:10 AM Pager: [phone] Consults: [phone] Mon-Fri 7:00 am-4:30 pm Sat-Sun 7:00 am-11:30 am

## 2016-01-28 NOTE — Progress Notes (Addendum)
LTACH referral per request, no insurance coverage per rep.

## 2016-01-28 NOTE — Progress Notes (Signed)
Patient spouse requested to see CSW. She reports she is upset with the care the patient is receiving at [facility] home. She feels the patient is being fed inproperly therefore is too weak to participate in physical therapy. She feels the facility is under staff and the patient does not get the attention he needs. LCSWA provided emotional support and explained, in a nursing home it is difficult for any pt. to receive one on one care around the clock care due to volume of pt. Needs and staff. Pt. Spouse agreeable. At this time spouse wants the patient to try another facility. CSW will assit with patient disposition.  Please refer to complete assessment completed on December 23, 2015 for additional information.   Vivi BarrackNicole Eliazar Olivar, Theresia MajorsLCSWA, MSW Clinical Social Worker 5E and Psychiatric Service Line [phone]

## 2016-01-28 NOTE — Progress Notes (Signed)
Speech Language Pathology Treatment: Dysphagia  Patient Details Name: [name] MRN: [id number] DOB: 1940/06/22 Today's Date: 01/28/2016 Time: 1100-1108 SLP Time Calculation (min) (ACUTE ONLY): 8 min  Assessment / Plan / Recommendation Clinical Impression  Brief visit for swallow therapy. Pt up in chair with PMSV in place with family present. Pt only verbalized when necessary, but voice was loud and clear. Provided set up assist for pt to complete oral care; pt did sneak a sip of water after rinsing mouth followed by watering eyes, but no coughing. SLP provided trials of ice with verbal cues for effortful swallow x10. No signs of aspiration with these efforts. Pts hyolaryngeal mobility subjectively strong. Cues for intermittent volitional cough/hock was also relatively strong. As this SLP has not met this pt in the past, cannot determine if pts presentation is different from prior observations, though pt does appear to have improving breath support compared with prior documentation. Reinforced precautions with ice chips and need for effortful swallows. Will follow for ongoing progress.    HPI HPI: [name] is a 75 y.o. male with h/o CAD and GERD who presents to the Emergency Department from [facility] by EMS complaining of nausea, vomiting, and diarrhea onset onset two days ago, worsening yesterday. Pt also has associated mild abdominal pain. Per daughter, pt has been admitted to the hospital recently for surgery in his abdomen; he has been staying at [facility] since then. Daughter reported to MD that pt has been fed through a G tube which gave him diarrhea that eventually resolved with a change in formula.   Daughter also informed MD that pt gradually started feeding by mouth again x2 days with return of diarrhea along with nausea and vomiting. Daughter notes that the facility staff have tried to change his formula but with no relief to his symptoms.  Pt has known  h/o dysphagia dysphonia, pna, peptic ulcer, GERD.  Last MBS done 12/29/15 showed moderate oropharyngeal dysphagia that looked marginally worse than test ono 12/17/15.  Pt left on ice chips only after oral care.  CXR 12/6 showed LLL consolidation ? ATX or pna.        SLP Plan  Continue with current plan of care     Recommendations  Diet recommendations: Other(comment) (ice chips after oral care)                Plan: Continue with current plan of care       Creston Shey Yott, MA CCC-SLP [phone]  Lynann Beaver 01/28/2016, 11:25 AM

## 2016-01-28 NOTE — Progress Notes (Signed)
Physical Therapy Treatment Patient Details Name: Danny GalRalph T Shankle MRN: [id number] DOB: 16-Dec-1940 Today's Date: 01/28/2016    History of Present Illness re admit from [facility] Dx N/V r/o ileus    PT Comments    Noted increased swelling of B LE Assisted OOB to amb a greater distance 180 feet on RA 94% with HR 83.  Pt progressing well with mobility. Pt requires MAX encouragement to participate.  Too quick to stay, "I can't walk", "not right now".   Spouse and daughter assisted with session by pushing recliner behind and supporting on one side.    Follow Up Recommendations  SNF     Equipment Recommendations  None recommended by PT    Recommendations for Other Services       Precautions / Restrictions Precautions Precautions: Fall Precaution Comments: NPO (X ice chips), gastric tube,TRACH uncapped Restrictions Weight Bearing Restrictions: No    Mobility  Bed Mobility Overal bed mobility: Needs Assistance       Supine to sit: Supervision     General bed mobility comments: able to self perform  Transfers Overall transfer level: Needs assistance Equipment used: None Transfers: Sit to/from Stand Sit to Stand: Min assist;+2 safety/equipment         General transfer comment: + 2 assist for safety.  50% VC's to push self up from bed/recliner and reach back prior to sit to control decend.  Pt likes to sit quickly due to fatigue  Ambulation/Gait Ambulation/Gait assistance: Min guard;Min assist;+2 safety/equipment Ambulation Distance (Feet): 180 Feet Assistive device: Bilateral platform walker       General Gait Details: amb with B platform walker for increased support and to increase distance.  Amb on RA avg sats 94% and HR 83.  Third assist to follow with recliner as pt tends to sit quickly due to fatigue.     Stairs            Wheelchair Mobility    Modified Rankin (Stroke Patients Only)       Balance                                     Cognition Arousal/Alertness: Awake/alert Behavior During Therapy: WFL for tasks assessed/performed Overall Cognitive Status: Within Functional Limits for tasks assessed                      Exercises      General Comments        Pertinent Vitals/Pain Pain Assessment: No/denies pain    Home Living                      Prior Function            PT Goals (current goals can now be found in the care plan section) Progress towards PT goals: Progressing toward goals    Frequency    Min 3X/week      PT Plan Current plan remains appropriate    Co-evaluation             End of Session Equipment Utilized During Treatment: Gait belt Activity Tolerance: Patient tolerated treatment well Patient left: in chair;with call bell/phone within reach;with family/visitor present     Time: [id number] PT Time Calculation (min) (ACUTE ONLY): 25 min  Charges:  $Gait Training: 8-22 mins $Therapeutic Activity: 8-22 mins  G Codes:      Rica Koyanagi  PTA [facility]  Acute  Rehab Pager      [phone]

## 2016-01-28 NOTE — Progress Notes (Signed)
Nutrition Follow-up  DOCUMENTATION CODES:   Severe malnutrition in context of acute illness/injury  INTERVENTION:   TPN per Pharmacy RD to continue to monitor for needs and plan  NUTRITION DIAGNOSIS:   Inadequate oral intake related to inability to eat as evidenced by NPO status.  Ongoing.  GOAL:   Patient will meet greater than or equal to 90% of their needs  Meeting with TPN.  MONITOR:   Labs, Weight trends, Skin, I & O's, Other (Comment) (TPN)  ASSESSMENT:   75 year-old male with a medical history of GERD, PUD, and CAD who presented to [facility] ED from [facility] via EMS with a cc of nausea, vomiting, and diarrhea. Patient is s/p Robotic anterior and posterior vagotomy, BI anastomosis, DOR fundoplication, omentopexy 09/2015 for partial GOO secondary to pyloric stricture, followed by placement of feeding jejunostomy and gastrostomy tube and EGD balloon dilation of duodenal stricture 12/01/15 for recurrent GOO secondary to edema at anastomosis and failure to thrive with malnutrition. On 12/03/15 the patient was taken to the OR for a diagnostic laparoscopy, omentopexy of jejunal disruption, and washout for jejunal disruption (pt pulled out jejunostomy tube) and gastric leak.   Noted plan to stop TF and place g-tube to gravity drain. Per discussion with RN, pt has had 800 cc drainage from g-tube site. Pt to transition to cyclic TPN.   Medications: Iv Protonix every 12 hours Labs reviewed: CBGs: 110-114 Low Na Mg/Phos/K WNL  Plan per Pharmacy 12/14: At 1800 today: begin 18-hr TPN cycle  Clinimix E 5/15, 2000 ml given over 18 hrs. Since patient currently on 24 hr TPN, no ramp-up phase needed at beginning of this cycle: ? 1800 tonight: start bag at 114 ml/hr x 17 hr ? 1100 tomorrow: reduce rate to 50 ml/hr x 1 hr, then off  20% fat emulsion at 13.3 ml/hr x 18 hr  Diet Order:  Diet NPO time specified Except for: Ice Chips, Other (See Comments) TPN  (CLINIMIX-E) Adult .TPN (CLINIMIX-E) Adult  Skin:     Stage II sacral pressure injury  Last BM:  12/8  Height:   Ht Readings from Last 1 Encounters:  01/21/16 6\' 2"  (1.88 m)    Weight:   Wt Readings from Last 1 Encounters:  01/28/16 176 lb 9.6 oz (80.1 kg)    Ideal Body Weight:  86.4 kg  BMI:  Body mass index is 22.67 kg/m.  Estimated Nutritional Needs:   Kcal:  2000-2200  Protein:  100-115g  Fluid:  2L/day  EDUCATION NEEDS:   No education needs identified at this time  Danny FrancoLindsey Shamecca Whitebread, MS, RD, LDN Pager: [phone] After Hours Pager: [phone]

## 2016-01-29 LAB — GLUCOSE, CAPILLARY
GLUCOSE-CAPILLARY: 111 mg/dL — AB (ref 65–99)
Glucose-Capillary: 113 mg/dL — ABNORMAL HIGH (ref 65–99)
Glucose-Capillary: 121 mg/dL — ABNORMAL HIGH (ref 65–99)
Glucose-Capillary: 118 mg/dL — ABNORMAL HIGH (ref 65–99)

## 2016-01-29 MED ORDER — CLINIMIX E/DEXTROSE (5/15) 5 % IV SOLN
INTRAVENOUS | Status: DC
  Filled 2016-01-29: qty 1988

## 2016-01-29 MED ORDER — M.V.I. ADULT IV INJ
INJECTION | INTRAVENOUS | Status: AC
  Administered 2016-01-29: 17:00:00 via INTRAVENOUS
  Filled 2016-01-29: qty 1988

## 2016-01-29 MED ORDER — FAT EMULSION 20 % IV EMUL
240.0000 mL | INTRAVENOUS | Status: DC
  Filled 2016-01-29: qty 250

## 2016-01-29 MED ORDER — FAT EMULSION 20 % IV EMUL
240.0000 mL | INTRAVENOUS | Status: AC
  Administered 2016-01-29: 240 mL via INTRAVENOUS
  Filled 2016-01-29: qty 250

## 2016-01-29 NOTE — Progress Notes (Signed)
[facility] Progress Note     Subjective: No family in the room this morning. Patient denies fever, chills, abdominal pain, nausea, vomiting. denies a BM yesterday. Sat up in his chair yesterday and ambulated with PT.  Due to conflicting information in TPN order, the cyclic rate was not started overnight and will be initiated tonight, prolonging the transition to 12 hr cyclic TPN - 18 hr cylcle tonight 12/15 and 12 hr tomorrow 12/16.  Objective: Vital signs in last 24 hours: Temp:  [98 F (36.7 C)-98.3 F (36.8 C)] 98 F (36.7 C) (12/15 0514) Pulse Rate:  [79-86] 79 (12/15 0759) Resp:  [16-20] 18 (12/15 0759) BP: (118-153)/(44-59) 136/46 (12/15 0514) SpO2:  [97 %-100 %] 98 % (12/15 0759) FiO2 (%):  [21 %-40 %] 21 % (12/15 0759) Weight:  [81.6 kg (180 lb)] 81.6 kg (180 lb) (12/15 0602) Last BM Date:  (patient does not remember)  Intake/Output from previous day: 12/14 0701 - 12/15 0700 In: 4293 [I.V.:3943; NG/GT:100; IV Piggyback:250] Out: 3200 [Urine:850; Drains:2350] Intake/Output this shift: No intake/output data recorded.  PE: Gen:  Alert, NAD, pleasant and cooperative - vocalizing better. HEENT: breathing ORA, some mucous coming out of tracheostomy Card:  RRR, no M/G/R appreciated Pulm: unlabored, CTA, no W/R/R, strong cough. Abd: Soft, NT/ND, +BS, G tube site c/d/i, laparoscopic incision sites healing well Ext:  No erythema or tenderness, stable to slightly increased pedal edema.   Lab Results:   Recent Labs  01/27/16 0349 01/28/16 0438  WBC 28.2* 11.3*  HGB 8.6* 7.5*  HCT 26.5* 23.2*  PLT 395 340   BMET  Recent Labs  01/27/16 0349 01/28/16 0438  NA 130* 129*  K 4.4 4.1  CL 100* 101  CO2 25 25  GLUCOSE 137* 135*  BUN 35* 31*  CREATININE 0.71 0.60*  CALCIUM 7.8* 7.6*   PT/INR No results for input(s): LABPROT, INR in the last 72 hours. CMP     Component Value Date/Time   NA 129 (L) 01/28/2016 0438   NA 139 01/14/2016   K 4.1  01/28/2016 0438   CL 101 01/28/2016 0438   CO2 25 01/28/2016 0438   GLUCOSE 135 (H) 01/28/2016 0438   BUN 31 (H) 01/28/2016 0438   BUN 23 (A) 01/14/2016   CREATININE 0.60 (L) 01/28/2016 0438   CALCIUM 7.6 (L) 01/28/2016 0438   PROT 5.3 (L) 01/28/2016 0438   ALBUMIN 1.5 (L) 01/28/2016 0438   AST 18 01/28/2016 0438   ALT 20 01/28/2016 0438   ALKPHOS 146 (H) 01/28/2016 0438   BILITOT 0.4 01/28/2016 0438   GFRNONAA >60 01/28/2016 0438   GFRAA >60 01/28/2016 0438   Lipase     Component Value Date/Time   LIPASE 55 (H) 01/20/2016 0158       Studies/Results: Dg Abd 1 View  Result Date: 01/28/2016 CLINICAL DATA:  Small bowel obstruction, abdominal distention EXAM: ABDOMEN - 1 VIEW COMPARISON:  01/21/2016 FINDINGS: Gastrostomy tube projects over the stomach. Gaseous distention of the stomach. Mildly distended mid abdominal small bowel loops, slightly increased since prior study. No free air. Prior cholecystectomy. IMPRESSION: Mild gaseous distention of the stomach and mid abdominal small bowel loops, slightly increased since prior study. Electronically Signed   By: Charlett NoseKevin  Dover M.D.   On: 01/28/2016 13:05    Anti-infectives: Anti-infectives    Start     Dose/Rate Route Frequency Ordered Stop   01/26/16 2300  piperacillin-tazobactam (ZOSYN) IVPB 3.375 g     3.375 g 12.5 mL/hr over  240 Minutes Intravenous Every 8 hours 01/26/16 2256     01/26/16 2230  vancomycin (VANCOCIN) IVPB 1000 mg/200 mL premix  Status:  Discontinued     1,000 mg 200 mL/hr over 60 Minutes Intravenous Every 12 hours 01/26/16 2227 01/26/16 2227   01/20/16 0400  vancomycin (VANCOCIN) IVPB 1000 mg/200 mL premix  Status:  Discontinued     1,000 mg 200 mL/hr over 60 Minutes Intravenous Every 12 hours 01/20/16 0323 01/21/16 0900   01/20/16 0330  ceFEPIme (MAXIPIME) 1 g in dextrose 5 % 50 mL IVPB     1 g 100 mL/hr over 30 Minutes Intravenous  Once 01/20/16 0317 01/20/16 0735       Assessment/Plan PSBO with 2  days of nausea and vomiting on admit 01/20/16 - S/P Robotic anterior and posterior vagotomy, BI anastomosis, DOR fundoplication, omentopexy for partial GOO 09/2015, Dr. Michaell CowingGross - Feeding Jejunostomy and gastrostomy/EGD balloon dilatation, secondary to edema at anastomosis, 12/01/15, Dr. Michaell CowingGross - S/p diagnostic laparoscopy, omentopexy of jejunal disruption, and washout for jejunal disruption (pt pulled out jejunostomy tube)and gastric leak. 12/03/15, Dr. Karie SodaSteven Gross (pt pulled out jejunostomy tube) - Malnutrition formerly on TF/now on TNA; attempted trickle tube feeds but abd film 12/14 showed dilated stomach and these were held - bowel function returned, continue dulcolax PRN   Aspiration, possible PNA - aspiration w/ start of bolus TF 12/12 - WBC trending down - repeat in AM  FEN: NPO, cyclic TPN, trickle tube feeds since tolerating? ID: cefepime 12/6 x1, vancomycin 12/6-12/7, Zosyn 12/13 >> DVT: Heparin/SCD   Plan: continue to taper TPN to cyclic - 18 hr tonight, 12 hr tomorrow night, anticipate discharge from hospital to SNF (awaiting response from [facility]) Sunday 12/17 if accepted. Family does not want patient to return to [facility] and [facility] deny [facility]. CBC in AM - if WNL consider d/c Zosyn vs finishing a 7 day course (d/c on 12/19)   LOS: 9 days    Adam PhenixElizabeth S Anthonia Monger , [facility] 01/29/2016, 10:18 AM Pager: [phone] Consults: [phone] Mon-Fri 7:00 am-4:30 pm Sat-Sun 7:00 am-11:30 am

## 2016-01-29 NOTE — Progress Notes (Signed)
Nutrition Follow-up  DOCUMENTATION CODES:   Severe malnutrition in context of acute illness/injury  INTERVENTION:  - TPN per Pharmacy. - Recommend re-start trickle TF via G-tube as soon as medically feasible (recommend Vital 1.5 @ 20 mL/hr). - RD will follow-up 12/18.  NUTRITION DIAGNOSIS:   Inadequate oral intake related to inability to eat as evidenced by NPO status. -ongoing  GOAL:   Patient will meet greater than or equal to 90% of their needs -met with current TPN regimen; will be met when changed to cyclic TPN.  MONITOR:   Weight trends, Labs, Skin, I & O's, Other (Comment) (TPN regimen, ability to re-start TF via G-tube)  ASSESSMENT:   75 year-old male with a medical history of GERD, PUD, and CAD who presented to [facility] from [facility] via EMS with a cc of nausea, vomiting, and diarrhea. Patient is s/p Robotic anterior and posterior vagotomy, BI anastomosis, DOR fundoplication, omentopexy 09/2015 for partial GOO secondary to pyloric stricture, followed by placement of feeding jejunostomy and gastrostomy tube and EGD balloon dilation of duodenal stricture 12/01/15 for recurrent GOO secondary to edema at anastomosis and failure to thrive with malnutrition. On 12/03/15 the patient was taken to the OR for a diagnostic laparoscopy, omentopexy of jejunal disruption, and washout for jejunal disruption (pt pulled out jejunostomy tube) and gastric leak.   12/15 Pt previously with G-J tube; now only G-tube. Notes reviewed from earlier in admission. G-tube continues to gravity drainage at this time with ~100-200cc drainage present in bag and tubing at time of visit around 1200 today. Pt reports resolving abdominal discomfort with G-tube to drainage. Spoke with pt's daughter, who is at bedside. She states that when pt d/c'ed from hospital he was on Jevity 1.2 formula and that at the facility he was having large quantities of diarrhea so he was switched to Osmolite 1.2  formula and diarrhea improved/lessened. D/t this, recommend switching to Vital formula while hospitalized as pt was on Vital during previous admission and was able to reach goal rate for TF without overt issue during that time.   Notes indicate confusion surrounding TPN order for overnight/today so pt continuous on Clinimix E 5/15 @ 83 mL/hr with 20% ILE @ 10 mL/hr at this time. Plan to transition to cyclic TPN (18 hours on, 6 hours off) tonight:  PLAN At 1800 today: begin 18-hr TPN cycle  Clinimix E 5/15, 2000 ml given over 18 hrs. Since patient currently on 24 hr TPN, no ramp-up phase needed at beginning of this cycle: ? 1800 tonight: start bag at 114 ml/hr x 17 hr ? 1100 tomorrow: reduce rate to 50 ml/hr x 1 hr, then off  20% fat emulsion at 13.3 ml/hr x 18 hr  Weight +1.5 kg from yesterday; will continue to monitor closely with TPN volume and current IVF. Goal once TF able to be re-started and advanced to goal rate: Vital 1.5 @ 60 mL/hr which will provide 2160 kcal, 97 grams of protein (97% minimum estimated protein need), 9 grams of fiber, and 1100 mL free water.   Medications reviewed; 10 mg Dulcolax/day, sliding scale Novolog, 5-10 mg IV Reglan (PRN), PRN IV Zofran, 40 mg IV Protonix BID, PRN IV Compazine. Labs reviewed; CBG: 113 mg/dL today, Na: 129 mmol/L, BUN: 31 mg/dL, creatinine: 0.6 mg/dL, Ca: 7.6 mg/dL.  IVF: NS @ 80 mL/hr.     12/14 Noted plan to stop TF and place g-tube to gravity drain. Per discussion with RN, pt has  had 800 cc drainage from g-tube site. Pt to transition to cyclic TPN.   Medications: Iv Protonix every 12 hours Labs reviewed: CBGs: 110-114 Low Na Mg/Phos/K WNL  Plan per Pharmacy 12/14: At 1800 today:begin 18-hr TPN cycle  ClinimixE 5/15, 2000 ml given over 18 hrs. Since patient currently on 24 hr TPN, no ramp-up phase needed at beginning of this cycle: ? 1800 tonight: start bag at 114 ml/hr x 17 hr ? 1100 tomorrow: reduce rate to 50 ml/hr x 1  hr, then off  20% fat emulsion at 13.3 ml/hr x 18 hr   Diet Order:  Diet NPO time specified Except for: Ice Chips, Other (See Comments) TPN (CLINIMIX-E) Adult  Skin:     Last BM:  12/14 PM (small)  Height:   Ht Readings from Last 1 Encounters:  01/21/16 _0  (1.88 m)    Weight:   Wt Readings from Last 1 Encounters:  01/29/16 180 lb (81.6 kg)    Ideal Body Weight:  86.4 kg  BMI:  Body mass index is 23.11 kg/m.  Estimated Nutritional Needs:   Kcal:  2000-2200  Protein:  100-115g  Fluid:  2L/day  EDUCATION NEEDS:   No education needs identified at this time     Jarome Matin, MS, RD, LDN, CNSC Inpatient Clinical Dietitian Pager # [phone] After hours/weekend pager # [phone]

## 2016-01-29 NOTE — Progress Notes (Signed)
Pt has productive cough. No sx needed at this time. RT Will continue to monitor.

## 2016-01-29 NOTE — Care Management Important Message (Signed)
Important Message  Patient Details  Name: [name] MRN: [id number] Date of Birth: 1940-08-13   Medicare Important Message Given:  Yes    Haskell FlirtJamison, Lanyah Spengler 01/29/2016, 11:49 AMImportant Message  Patient Details  Name: [name] MRN: [id number] Date of Birth: 1940-08-13   Medicare Important Message Given:  Yes    Haskell FlirtJamison, Shem Plemmons 01/29/2016, 11:49 AM

## 2016-01-29 NOTE — Progress Notes (Signed)
Physical Therapy Treatment Patient Details Name: Ardyth GalRalph T Collison MRN: [id number] DOB: Nov 10, 1940 Today's Date: 01/29/2016    History of Present Illness re admit from [facility] Dx N/V r/o ileus    PT Comments    Pt continues to present with increased edema B LE and UE.  Wrist bands had to be cut off. Pt OOB in recliner.  Daughter and spouse very helpful and assisted me amb pt and transferring on/off BSC.   Pt progressing well with mobility on RA and tolerating increased distance.    Follow Up Recommendations  SNF     Equipment Recommendations  None recommended by PT    Recommendations for Other Services       Precautions / Restrictions Precautions Precautions: Fall Precaution Comments: NPO (X ice chips), gastric tube,TRACH uncapped on RA Restrictions Weight Bearing Restrictions: No    Mobility  Bed Mobility               General bed mobility comments: pt OOB in recliner  Transfers Overall transfer level: Needs assistance Equipment used: None Transfers: Sit to/from Stand Sit to Stand: Mod assist;+2 safety/equipment Stand pivot transfers: Mod assist;+2 safety/equipment       General transfer comment: + 2 assist for safety.  50% VC's to push self up from bed/recliner and reach back prior to sit to control decend.  Pt likes to sit quickly due to fatigue  demonstarates increased ability to self rise.  Ambulation/Gait Ambulation/Gait assistance: Min assist Ambulation Distance (Feet): 175 Feet Assistive device: Bilateral platform walker Gait Pattern/deviations: Step-to pattern;Step-through pattern Gait velocity: decreased   General Gait Details: amb with B platform walker for increased support and to increase distance.  Amb on RA avg sats 94% and HR 83.  Third assist to follow with recliner as pt tends to sit quickly due to fatigue.     Stairs            Wheelchair Mobility    Modified Rankin (Stroke Patients Only)       Balance                                    Cognition Arousal/Alertness: Awake/alert Behavior During Therapy: WFL for tasks assessed/performed Overall Cognitive Status: Within Functional Limits for tasks assessed                      Exercises      General Comments        Pertinent Vitals/Pain Pain Assessment: No/denies pain    Home Living                      Prior Function            PT Goals (current goals can now be found in the care plan section)      Frequency    Min 3X/week      PT Plan Current plan remains appropriate    Co-evaluation             End of Session Equipment Utilized During Treatment: Gait belt Activity Tolerance: Patient tolerated treatment well Patient left: in chair;with call bell/phone within reach;with family/visitor present     Time: [id number] PT Time Calculation (min) (ACUTE ONLY): 44 min  Charges:  $Gait Training: 8-22 mins $Therapeutic Activity: 23-37 mins  G Codes:      Rica Koyanagi  PTA [facility]  Acute  Rehab Pager      [phone]

## 2016-01-29 NOTE — Progress Notes (Signed)
Occupational Therapy Treatment Patient Details Name: Danny GalRalph T [name] MRN: [id number] DOB: 02-01-1941 Today's Date: 01/29/2016    History of present illness re admit from [facility] [place] Dx N/V r/o ileus   OT comments  Pt making progress - pt able to stand for approx 2 minutes without a rest break wwhile OT cleaned pt  Follow Up Recommendations  SNF    Equipment Recommendations  3 in 1 bedside commode    Recommendations for Other Services      Precautions / Restrictions Precautions Precautions: Fall Precaution Comments: NPO (X ice chips), gastric tube,TRACH uncapped on RA Restrictions Weight Bearing Restrictions: No       Mobility Bed Mobility Overal bed mobility: Needs Assistance Bed Mobility: Supine to Sit     Supine to sit: Supervision     General bed mobility comments: pt OOB in recliner  Transfers Overall transfer level: Needs assistance Equipment used: None Transfers: Sit to/from Stand Sit to Stand: Mod assist;+2 safety/equipment Stand pivot transfers: Mod assist;+2 safety/equipment       General transfer comment: + 2 assist for safety.  50% VC's to push self up from bed/recliner and reach back prior to sit to control decend.  Pt likes to sit quickly due to fatigue  demonstarates increased ability to self rise.    Balance                                   ADL Overall ADL's : Needs assistance/impaired                         Toilet Transfer: Moderate assistance;Cueing for safety;RW Toilet Transfer Details (indicate cue type and reason): sit to stand only Toileting- Clothing Manipulation and Hygiene: +2 for safety/equipment;Sit to/from stand;Cueing for safety;Cueing for sequencing;Total assistance         General ADL Comments: pt stood for approx 2 minutes while OT cleaned pt.        Vision                     Perception     Praxis      Cognition   Behavior During Therapy: WFL for tasks  assessed/performed Overall Cognitive Status: Within Functional Limits for tasks assessed                       Extremity/Trunk Assessment               Exercises     Shoulder Instructions       General Comments      Pertinent Vitals/ Pain       Pain Assessment: No/denies pain  Home Living                                          Prior Functioning/Environment              Frequency  Min 2X/week        Progress Toward Goals  OT Goals(current goals can now be found in the care plan section)  Progress towards OT goals: Progressing toward goals     Plan Discharge plan remains appropriate    Co-evaluation                 End of Session Equipment  Utilized During Treatment: Rolling walker   Activity Tolerance Patient tolerated treatment well   Patient Left in chair;with call bell/phone within reach;with family/visitor present   Nurse Communication Mobility status        Time: 1250-1330 OT Time Calculation (min): 40 min  Charges: OT General Charges $OT Visit: 1 Procedure OT Treatments $Self Care/Home Management : 38-52 mins  [name] 01/29/2016, 3:02 PM

## 2016-01-29 NOTE — Progress Notes (Signed)
Pharmacy Antibiotic Note  [name] is a 75 y.o. male admitted on 01/19/2016 with pneumonia. Completed original course of vancomycin/cefepime, but had aspiration event 12/12 PM and started on Zosyn for suspected aspiration PNA. Today is day 3 of Zosyn.  Plan:  Continue Zosyn 3.375g IV q8h (4 hour infusion).  Renal function and other clinical ID parameters stable, will sign off  Consider limiting duration to 7 days for aspiration pneumonia if remains stable  Height: 6\' 2"  (188 cm) Weight: 180 lb (81.6 kg) IBW/kg (Calculated) : 82.2  Temp (24hrs), Avg:98.1 F (36.7 C), Min:98 F (36.7 C), Max:98.3 F (36.8 C)   Recent Labs Lab 01/25/16 0412 01/26/16 0449 01/27/16 0009 01/27/16 0349 01/28/16 0438  WBC 7.7  --  17.8* 28.2* 11.3*  CREATININE 0.58* 0.65  --  0.71 0.60*    Estimated Creatinine Clearance: 90.7 mL/min (by C-G formula based on SCr of 0.6 mg/dL (L)).    Allergies  Allergen Reactions  . Flomax [Tamsulosin Hcl] Other (See Comments)    Leg weakness  . Lisinopril Cough  . Lorazepam     "i don't like it"  Prefers Xanax or valium  . Uroxatral [Alfuzosin Hcl Er] Other (See Comments)    Leg weakness    Antimicrobials this admission:  12/6 cefepime >> 12/6 12/6 vancomycin >> 12/7 12/12 zosyn >>  Microbiology results: 12/7 influenza A/B negative    Thank you for allowing pharmacy to be a part of this patient's care.  Bernadene Personrew Kameren Baade, PharmD, BCPS Pager: [phone] 01/29/2016, 9:55 AM

## 2016-01-29 NOTE — Progress Notes (Signed)
[name]ARM[facility]Y - ADULT TOTAL PARENTERAL NUTRITION CONSULT NOTE   Pharmacy Consult for TPN Indication: PTA TPN continuation, intolerance to enteral feed  Insulin Requirements: 1 units Novolog SSI yesterday  Current Nutrition:  - Clinimix as below  - TFs held PTA TPN from advanced home care: [facility], pt received 405 grams (1376 kcal)  of dextrose, 75 grams (300 kcal) of protein and 50 grams ( 480 kcal) of lipid for a total of 2156 kcal/day  IVF: NS at 80 ml/hr  Central access: PICC TPN start date: PTA, restarted on admission on 12/6   ASSESSMENT                                                                                                          HPI: [id number] yoM with hx PUD, CAD, GERD, BPH,  s/p multiple procedures  and extended hospitalization for recurrent partial GOO 2/2 pyloric stricture on TPN for several days, discharged 11/15 with J/G tube in place on TFs, unable to tolerate and [facility], readmitted 12/6 with partial SBO. Pharmacy consulted to resume TPN.  Significant events:  12/6: TPN continued on admission. [facility]: Pt pulled out NGT, not replaced, remains NPO 12/8: trial of ice chips 12/10: continue TPN, plan swallow study Monday 12/11, if no aspiration, will start to advance diet.   12/11: Awaiting MBS.  Having bowel function.  Continue TPN for now.   12/12: Severe aspiration risk per MBS yesterday - recommend continuing NPO. Osmolite 1.2 CAL via TF BID ordered 12/11 at 6pm. Per discussion with CCS PA, continue TPN at current goal rate today. F/u for ability to wean tomorrow if tolerates TF. 12/13: Did not tolerate bolus feeds through G-tube; overnight emesis and aspiration event, started on Zosyn. CCS trying trickle feeds today. 12/14 transition to cyclic TPN - appears recovery may be prolonged. Not tolerating trickle feeds; G-tube to drainage.  12/15: TPN was not run at cyclic rate overnight d/t conflicting  information in TPN order. To provide patient full needed nutrition, will continue to run TPN at 83 ml/hr over 24 hr and begin 18-hr cycle with tonight's TPN   Today, 01/29/2016:   Glucose - No hx DM noted, CBGs at goal (max 114) - SSI q8h No labs today, below as of 12/14  Electrolytes - Na remains low but stable, K, Mg at goal after repletion; others WNL  Renal - SCr WNL  LFTs - AST/ALT, AlkPhos back down today after rising yesterday; appear to be rising and falling periodically  TGs - 177 ( 11/27), 93 (12/7), 119 (12/11)  Prealbumin - 15.3 ( 11/13), 10.8 (12/7), 12.8 (12/11)  NUTRITIONAL GOALS  RD recs (12/7): Kcal 2000-2200, Protein 100-115g, Fluid 2L/day  [facility] 2156 Kcal/day, Protein 75g/day  Clinimix E 5/15 at a goal rate of 90 ml/hr + 20% fat emulsion at  10 ml/hr to provide: 108 g/day protein, 2013 Kcal/day.  **There is currently a Acupuncturistnational backorder of Clinimix solution. To conserve supply, will keep goal rate of Clinimix 5/15 at 83 ml/hr + 20% fat emulsion at 5610ml/hr. This provides 100g/day protein, 1894 Kcal/day.  PLAN                                                                                                                           At 1800 today: begin 18-hr TPN cycle  Clinimix E 5/15, 2000 ml given over 18 hrs. Since patient currently on 24 hr TPN, no ramp-up phase needed at beginning of this cycle:  1800 tonight: start bag at 114 ml/hr x 17 hr  1100 tomorrow: reduce rate to 50 ml/hr x 1 hr, then off  20% fat emulsion at 13.3 ml/hr x 18 hr  TPN to contain standard multivitamins and trace elements.  CBG coverage per cyclic TPN guidelines  2000 tonight: 2-hr after starting cyclic rate  0600 tomorrow: on cycle  1300 tomorrow: 1-hr after stopping cycle  1700 tomorrow: off cycle  TPN lab panels on Mondays & Thursdays, will reorder CMP, Mg for Saturday  AM  Continue NS at 80 ml/hr to keep total intake < 200 ml/hr while TPN cycling  Bernadene Personrew Steven Basso, PharmD, BCPS Pager: [phone] 01/29/2016, 8:11 AM

## 2016-01-30 ENCOUNTER — Encounter: Payer: Self-pay | Admitting: Internal Medicine

## 2016-01-30 LAB — CBC
HEMATOCRIT: 22.8 % — AB (ref 39.0–52.0)
Hemoglobin: 7.5 g/dL — ABNORMAL LOW (ref 13.0–17.0)
MCH: 29.5 pg (ref 26.0–34.0)
MCHC: 32.9 g/dL (ref 30.0–36.0)
MCV: 89.8 fL (ref 78.0–100.0)
Platelets: 364 10*3/uL (ref 150–400)
RBC: 2.54 MIL/uL — ABNORMAL LOW (ref 4.22–5.81)
RDW: 17.4 % — AB (ref 11.5–15.5)
WBC: 7.3 10*3/uL (ref 4.0–10.5)

## 2016-01-30 LAB — COMPREHENSIVE METABOLIC PANEL
ALT: 16 U/L — AB (ref 17–63)
AST: 19 U/L (ref 15–41)
Albumin: 1.4 g/dL — ABNORMAL LOW (ref 3.5–5.0)
Alkaline Phosphatase: 122 U/L (ref 38–126)
Anion gap: 5 (ref 5–15)
BILIRUBIN TOTAL: 0.4 mg/dL (ref 0.3–1.2)
BUN: 25 mg/dL — AB (ref 6–20)
CHLORIDE: 105 mmol/L (ref 101–111)
CO2: 23 mmol/L (ref 22–32)
CREATININE: 0.58 mg/dL — AB (ref 0.61–1.24)
Calcium: 7.7 mg/dL — ABNORMAL LOW (ref 8.9–10.3)
GFR calc Af Amer: >60 mL/min (ref >60)
Glucose, Bld: 105 mg/dL — ABNORMAL HIGH (ref 65–99)
Potassium: 3.9 mmol/L (ref 3.5–5.1)
Sodium: 133 mmol/L — ABNORMAL LOW (ref 135–145)
TOTAL PROTEIN: 5.4 g/dL — AB (ref 6.5–8.1)

## 2016-01-30 LAB — MAGNESIUM: Magnesium: 1.7 mg/dL (ref 1.7–2.4)

## 2016-01-30 LAB — GLUCOSE, CAPILLARY
GLUCOSE-CAPILLARY: 90 mg/dL (ref 65–99)
Glucose-Capillary: 118 mg/dL — ABNORMAL HIGH (ref 65–99)
Glucose-Capillary: 93 mg/dL (ref 65–99)

## 2016-01-30 MED ORDER — INSULIN ASPART 100 UNIT/ML ~~LOC~~ SOLN
0.0000 [IU] | Freq: Four times a day (QID) | SUBCUTANEOUS | Status: DC
  Administered 2016-02-01 – 2016-02-05 (×5): 2 [IU] via SUBCUTANEOUS

## 2016-01-30 MED ORDER — SODIUM CHLORIDE 0.9 % IV SOLN
INTRAVENOUS | Status: AC
  Administered 2016-02-01 – 2016-02-04 (×3): via INTRAVENOUS
  Administered 2016-02-05: 80 mL/h via INTRAVENOUS
  Administered 2016-02-07: 16:00:00 via INTRAVENOUS

## 2016-01-30 MED ORDER — MORPHINE SULFATE (PF) 2 MG/ML IV SOLN
2.0000 mg | INTRAVENOUS | Status: DC | PRN
  Administered 2016-01-30 (×2): 2 mg via INTRAVENOUS
  Administered 2016-01-31 (×4): 4 mg via INTRAVENOUS
  Administered 2016-01-31: 2 mg via INTRAVENOUS
  Administered 2016-01-31: 4 mg via INTRAVENOUS
  Administered 2016-01-31: 2 mg via INTRAVENOUS
  Administered 2016-02-01 – 2016-02-02 (×4): 4 mg via INTRAVENOUS
  Administered 2016-02-03 (×3): 2 mg via INTRAVENOUS
  Administered 2016-02-03: 4 mg via INTRAVENOUS
  Administered 2016-02-04 (×4): 2 mg via INTRAVENOUS
  Administered 2016-02-05 (×2): 4 mg via INTRAVENOUS
  Administered 2016-02-05 (×3): 2 mg via INTRAVENOUS
  Administered 2016-02-05 – 2016-02-06 (×5): 4 mg via INTRAVENOUS
  Administered 2016-02-06: 2 mg via INTRAVENOUS
  Administered 2016-02-06: 4 mg via INTRAVENOUS
  Administered 2016-02-06: 10:00:00 2 mg via INTRAVENOUS
  Administered 2016-02-06 (×2): 4 mg via INTRAVENOUS
  Administered 2016-02-06: 2 mg via INTRAVENOUS
  Administered 2016-02-06: 4 mg via INTRAVENOUS
  Administered 2016-02-06: 2 mg via INTRAVENOUS
  Administered 2016-02-07: 4 mg via INTRAVENOUS
  Administered 2016-02-07: 2 mg via INTRAVENOUS
  Administered 2016-02-07 (×2): 4 mg via INTRAVENOUS
  Administered 2016-02-07: 2 mg via INTRAVENOUS
  Administered 2016-02-07 (×3): 4 mg via INTRAVENOUS
  Administered 2016-02-08 – 2016-02-10 (×20): 2 mg via INTRAVENOUS
  Administered 2016-02-10: 4 mg via INTRAVENOUS
  Administered 2016-02-10 (×3): 2 mg via INTRAVENOUS
  Administered 2016-02-10: 4 mg via INTRAVENOUS
  Administered 2016-02-10: 2 mg via INTRAVENOUS
  Administered 2016-02-10: 4 mg via INTRAVENOUS
  Administered 2016-02-10 – 2016-02-11 (×3): 2 mg via INTRAVENOUS
  Administered 2016-02-11: 4 mg via INTRAVENOUS
  Administered 2016-02-11 (×3): 2 mg via INTRAVENOUS
  Filled 2016-01-30: qty 2
  Filled 2016-01-30 (×7): qty 1
  Filled 2016-01-30 (×4): qty 2
  Filled 2016-01-30 (×3): qty 1
  Filled 2016-01-30 (×2): qty 2
  Filled 2016-01-30: qty 1
  Filled 2016-01-30 (×4): qty 2
  Filled 2016-01-30: qty 1
  Filled 2016-01-30 (×2): qty 2
  Filled 2016-01-30: qty 1
  Filled 2016-01-30: qty 2
  Filled 2016-01-30 (×2): qty 1
  Filled 2016-01-30 (×2): qty 2
  Filled 2016-01-30: qty 1
  Filled 2016-01-30 (×3): qty 2
  Filled 2016-01-30 (×4): qty 1
  Filled 2016-01-30 (×2): qty 2
  Filled 2016-01-30 (×3): qty 1
  Filled 2016-01-30: qty 2
  Filled 2016-01-30 (×2): qty 1
  Filled 2016-01-30: qty 2
  Filled 2016-01-30 (×3): qty 1
  Filled 2016-01-30: qty 2
  Filled 2016-01-30 (×5): qty 1
  Filled 2016-01-30: qty 2
  Filled 2016-01-30 (×3): qty 1
  Filled 2016-01-30: qty 2
  Filled 2016-01-30: qty 1
  Filled 2016-01-30: qty 2
  Filled 2016-01-30: qty 1
  Filled 2016-01-30: qty 2
  Filled 2016-01-30 (×3): qty 1
  Filled 2016-01-30: qty 2
  Filled 2016-01-30 (×2): qty 1
  Filled 2016-01-30: qty 2
  Filled 2016-01-30 (×2): qty 1
  Filled 2016-01-30: qty 2
  Filled 2016-01-30 (×2): qty 1
  Filled 2016-01-30: qty 2
  Filled 2016-01-30: qty 1

## 2016-01-30 MED ORDER — TRACE MINERALS CR-CU-MN-SE-ZN 10-1000-500-60 MCG/ML IV SOLN
INTRAVENOUS | Status: AC
  Administered 2016-01-30: 18:00:00 via INTRAVENOUS
  Filled 2016-01-30 (×2): qty 2000

## 2016-01-30 MED ORDER — METHOCARBAMOL 1000 MG/10ML IJ SOLN
500.0000 mg | Freq: Four times a day (QID) | INTRAVENOUS | Status: DC | PRN
  Administered 2016-01-30 – 2016-02-21 (×25): 500 mg via INTRAVENOUS
  Filled 2016-01-30: qty 5
  Filled 2016-01-30: qty 550
  Filled 2016-01-30 (×5): qty 5
  Filled 2016-01-30 (×2): qty 550
  Filled 2016-01-30 (×3): qty 5
  Filled 2016-01-30 (×2): qty 550
  Filled 2016-01-30: qty 5
  Filled 2016-01-30: qty 550
  Filled 2016-01-30: qty 5
  Filled 2016-01-30: qty 550
  Filled 2016-01-30: qty 5
  Filled 2016-01-30: qty 550
  Filled 2016-01-30 (×2): qty 5
  Filled 2016-01-30 (×2): qty 550
  Filled 2016-01-30: qty 5
  Filled 2016-01-30: qty 550
  Filled 2016-01-30: qty 5
  Filled 2016-01-30: qty 550
  Filled 2016-01-30 (×5): qty 5
  Filled 2016-01-30: qty 550
  Filled 2016-01-30 (×3): qty 5
  Filled 2016-01-30: qty 550
  Filled 2016-01-30 (×2): qty 5
  Filled 2016-01-30 (×2): qty 550
  Filled 2016-01-30 (×2): qty 5
  Filled 2016-01-30 (×5): qty 550
  Filled 2016-01-30: qty 5
  Filled 2016-01-30 (×2): qty 550

## 2016-01-30 MED ORDER — FAT EMULSION 20 % IV EMUL
246.0000 mL | INTRAVENOUS | Status: AC
  Administered 2016-01-30: 246 mL via INTRAVENOUS
  Filled 2016-01-30: qty 250

## 2016-01-30 MED ORDER — ALTEPLASE 2 MG IJ SOLR
2.0000 mg | Freq: Once | INTRAMUSCULAR | Status: DC
  Filled 2016-01-30: qty 2

## 2016-01-30 NOTE — Progress Notes (Signed)
[name].PHARMACY - ADULT TOTAL PARENTERAL NUTRITION CONSULT NOTE   Pharmacy Consult for TPN Indication: PTA TPN continuation, intolerance to enteral feed  Insulin Requirements: 0 units Novolog SSI yesterday  Current Nutrition:  - Clinimix as below  - TFs held PTA TPN from advanced home care: [facility], pt received 405 grams (1376 kcal)  of dextrose, 75 grams (300 kcal) of protein and 50 grams ( 480 kcal) of lipid for a total of 2156 kcal/day  IVF: NS at 80 ml/hr  Central access: PICC TPN start date: PTA, restarted on admission on 12/6   ASSESSMENT                                                                                                          HPI: [id number] yoM with hx PUD, CAD, GERD, BPH,  s/p multiple procedures  and extended hospitalization for recurrent partial GOO 2/2 pyloric stricture on TPN for several days, discharged 11/15 with J/G tube in place on TFs, unable to tolerate and [facility], readmitted 12/6 with partial SBO. Pharmacy consulted to resume TPN.  Significant events:  12/6: TPN continued on admission. [facility]: Pt pulled out NGT, not replaced, remains NPO 12/8: trial of ice chips 12/10: continue TPN, plan swallow study Monday 12/11, if no aspiration, will start to advance diet.   12/11: Awaiting MBS.  Having bowel function.  Continue TPN for now.   12/12: Severe aspiration risk per MBS yesterday - recommend continuing NPO. Osmolite 1.2 CAL via TF BID ordered 12/11 at 6pm. Per discussion with CCS PA, continue TPN at current goal rate today. F/u for ability to wean tomorrow if tolerates TF. 12/13: Did not tolerate bolus feeds through G-tube; overnight emesis and aspiration event, started on Zosyn. CCS trying trickle feeds today. 12/14 transition to cyclic TPN - appears recovery may be prolonged. Not tolerating trickle feeds; G-tube to drainage.  12/15: TPN was not run at cyclic rate overnight d/t conflicting  information in TPN order. To provide patient full needed nutrition, will continue to run TPN at 83 ml/hr over 24 hr and begin 18-hr cycle with tonight's TPN   Today, 01/30/2016:   Glucose - No hx DM noted, CBGs at goal - SSI q8h  Electrolytes - Na remains low but improved, Mg on lower end normal  Renal - SCr WNL  I/O = -76108ml/24h (drain 1075ml out)  LFTs - AST/ALT, AlkPhos back down today after rising yesterday; appear to be rising and falling periodically  TGs - 177 ( 11/27), 93 (12/7), 119 (12/11)  Prealbumin - 15.3 ( 11/13), 10.8 (12/7), 12.8 (12/11)  NUTRITIONAL GOALS  RD recs (12/7): Kcal 2000-2200, Protein 100-115g, Fluid 2L/day  [facility] 2156 Kcal/day, Protein 75g/day  Clinimix E 5/15 at a goal rate of 90 ml/hr + 20% fat emulsion at  10 ml/hr to provide: 108 g/day protein, 2013 Kcal/day.  **There is currently a Acupuncturistnational backorder of Clinimix solution. To conserve supply, will keep goal rate of Clinimix 5/15 at 83 ml/hr + 20% fat emulsion at 3210ml/hr. This provides 100g/day protein, 1894 Kcal/day.  PLAN                                                                                                                           At 1800 today: begin 12-hr TPN cycle  Clinimix E 5/15, 2000 ml given over 12h cycle as follows (d/w patient's RN):   1800 tonight: 6750ml/hr x 1 hr  1900 tonight: 190 ml/hr x 10h  0500 tomorrow (12/17): reduce rate to 50 ml/hr x 1 hr, then off  20% fat emulsion at 20.5 ml/hr x 12 hr  TPN to contain standard multivitamins and trace elements.  CBG coverage per cyclic rates  TPN lab panels on Mondays & Thursdays,   Continue NS at 80 ml/hr x 12 h (6a - 6p) while TPN off  [name] [name], PharmD, BCPS.   Pager: [phone] 01/30/2016 7:43 AM

## 2016-01-30 NOTE — Progress Notes (Signed)
Location:  [facility]   Place of Service:  SNF [phone]) Merrilee Seashore MD  Patient Care Team: [name], MD as PCP - General (Internal Medicine) Karie Soda, MD as Consulting Physician (General Surgery) Willis Modena, MD as Consulting Physician (Gastroenterology) Coralyn Helling, MD as Consulting Physician (Pulmonary Disease)  Extended Emergency Contact Information Primary Emergency Contact: [name] Address: [address]          [place], [place] [address] [name] Home Phone: [phone] Mobile Phone: [phone] Relation: Spouse Secondary Emergency Contact: [name] [place] of [place] Mobile Phone: [phone] Relation: Daughter    Allergies: Flomax [tamsulosin hcl]; Lisinopril; Lorazepam; and Uroxatral [alfuzosin hcl er]  Chief Complaint  Patient presents with  . Acute Visit    HPI: Patient is 75 y.o. male who is being followed for hypernatremia, Gtube feedings and TPN. ST has seen pt and Ok'ed him for liquids. He wanted chocolate milk so ST watched him as he drank it. No diarrhea so far. TPN is going well. Still watching his Na+ daily.  Past Medical History:  Diagnosis Date  . Bilateral dry eyes   . Bile peritonitis due to gastric tube dislodgement 12/03/2015  . BPH (benign prostatic hyperplasia)   . Central pterygium of right eye   . Coronary artery disease 1997, 2009   bare mental stent right coronart artery, occlusive followed by Dr. Garnette Scheuermann in [place]  . Degenerative disc disease    LOWER BACK  . Dysphonia   . ED (erectile dysfunction)   . Gastric outlet obstruction 11/24/2015  . Gastroesophageal reflux disease   . Hyperlipidemia   . Hyperopia of both eyes with astigmatism and presbyopia   . Hypertension   . Nuclear senile cataract    [facility] in [place]  . Peptic ulcer   . Pneumatosis of intestines 12/11/2015  . Pneumonia    HISTORY OF IN CHILDHOOD  . Posterior  vitreous detachment 10/30/2012  . Salzmann's nodular dystrophy of left eye   . Urinary hesitancy   . Wears glasses     Past Surgical History:  Procedure Laterality Date  . CARDIAC CATHETERIZATION    . CHOLECYSTECTOMY    . ESOPHAGOGASTRODUODENOSCOPY N/A 12/01/2015   Procedure: ESOPHAGOGASTRODUODENOSCOPY (EGD) BALLOON DILATION OF DUODENAL STRICTURE;  Surgeon: Karie Soda, MD;  Location: [facility];  Service: General;  Laterality: N/A;  . ESOPHAGOGASTRODUODENOSCOPY N/A 12/14/2015   Procedure: ESOPHAGOGASTRODUODENOSCOPY (EGD);  Surgeon: Vida Rigger, MD;  Location: [facility];  Service: Endoscopy;  Laterality: N/A;  Patient has tracheostomy  . ESOPHAGOGASTRODUODENOSCOPY (EGD) WITH PROPOFOL N/A 07/01/2015   Procedure: ESOPHAGOGASTRODUODENOSCOPY (EGD) WITH PROPOFOL;  Surgeon: Willis Modena, MD;  [facility];  Service: Endoscopy;  Laterality: N/A;  . ESOPHAGOGASTRODUODENOSCOPY (EGD) WITH PROPOFOL N/A 11/23/2015   Procedure: ESOPHAGOGASTRODUODENOSCOPY (EGD) WITH PROPOFOL;  Surgeon: Willis Modena, MD;  Location: [facility] ENDOSCOPY;  Service: Endoscopy;  Laterality: N/A;  may need to intubate  . EYE SURGERY     growth on right eye removed   . IR GENERIC HISTORICAL  12/13/2015   IR REPLC DUODEN/JEJUNO TUBE PERCUT W/FLUORO 12/13/2015 [name], MD [facility]  . LAPAROSCOPIC GASTROSTOMY N/A 12/01/2015   Procedure: LAPAROSCOPIC PLACEMENT OF FEEDING JEJUNOSTOMY AND GASTROSTOMY TUBE;  Surgeon: Karie Soda, MD;  Location: [facility];  Service: General;  Laterality: N/A;  . LAPAROSCOPY N/A 12/03/2015   Procedure: LAPAROSCOPY DIAGNOSTIC, OMENTOPEXY, JEJUNOSTOMY, WASH OUT;  Surgeon: Karie Soda, MD;  Location: [facility];  Service: General;  Laterality: N/A;  .  STENT TO HEART     2 1997 AND 1 IN 1996  . TONSILLECTOMY    . XI ROBOTIC VAGOTOMY AND ANTRECTOMY N/A 09/25/2015   Procedure: XI ROBOTIC ANTERIOR AND POSTERIOR VAGOTOMY, BILROTH I  ANASTOMOSIS DOR FUNDIPLICATION, OMENTOPEXY  UPPER ENDOSCOPY;  Surgeon:  Karie SodaSteven Gross, MD;  Location: [facility];  Service: General;  Laterality: N/A;    Allergies as of 01/18/2016      Reactions   Flomax [tamsulosin Hcl] Other (See Comments)   Leg weakness   Lisinopril Cough   Lorazepam    "i don't like it"  Prefers Xanax or valium   Uroxatral [alfuzosin Hcl Er] Other (See Comments)   Leg weakness      Medication List    Notice   This visit is during an admission. Changes to the med list made in this visit will be reflected in the After Visit Summary of the admission.     No orders of the defined types were placed in this encounter.   Immunization History  Administered Date(s) Administered  . PPD Test 01/13/2016    Social History  Substance Use Topics  . Smoking status: Former Smoker    Packs/day: 2.00    Years: 20.00    Types: Cigarettes    Quit date: 02/15/1995  . Smokeless tobacco: Never Used  . Alcohol use No    Review of Systems  DATA OBTAINED: from patient GENERAL:  no fevers, fatigue, appetite changes SKIN: No itching, rash HEENT: No complaint RESPIRATORY: No cough, wheezing, SOB CARDIAC: No chest pain, palpitations, lower extremity edema  GI: mild  abdominal pain, No N/V/D or constipation, No heartburn or reflux  GU: No dysuria, frequency or urgency, or incontinence  MUSCULOSKELETAL: No unrelieved bone/joint pain NEUROLOGIC: No headache, dizziness  PSYCHIATRIC: No overt anxiety or sadness  Vitals:   01/30/16 1507  BP: (!) 112/58  Pulse: 81  Resp: 18  Temp: 97.6 F (36.4 C)   There is no height or weight on file to calculate BMI. Physical Exam  GENERAL APPEARANCE: Alert, conversant, No acute distress  SKIN: No diaphoresis rash HEENT: Unremarkable RESPIRATORY: Breathing is even, unlabored. Lung sounds are clear   CARDIOVASCULAR: Heart RRR no murmurs, rubs or gallops. No peripheral edema  GASTROINTESTINAL: Abdomen is soft, non-tender, not distended w/ normal bowel sounds.; G tube  GENITOURINARY: Bladder non tender, not  distended  MUSCULOSKELETAL: No abnormal joints or musculature NEUROLOGIC: Cranial nerves 2-12 grossly intact. Moves all extremities PSYCHIATRIC: Mood and affect appropriate to situation, no behavioral issues  Patient Active Problem List   Diagnosis Date Noted  . Hypomagnesemia 01/21/2016  . Hypokalemia 01/21/2016  . SBO (small bowel obstruction) 01/20/2016  . Pressure injury of skin 01/20/2016  . Tracheostomy care (HCC)   . Pleural effusion   . Gastroesophageal reflux disease 01/19/2016  . Insomnia 01/19/2016  . Aspiration pneumonia (HCC) 01/17/2016  . On total parenteral nutrition (TPN) 01/17/2016  . Metabolic acidosis 01/17/2016  . Skin ulcer of abdominal wall, limited to breakdown of skin (HCC) 01/17/2016  . Dysphagia 01/17/2016  . Generalized weakness 01/17/2016  . 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 Vitamin B12 deficiency 01/02/2016  . BPH (benign prostatic hyperplasia)   . Posterior vitreous detachment   . Salzmann's nodular dystrophy of left eye   . Hyperopia of both eyes with astigmatism and presbyopia   . Central pterygium of right eye   . Pressure ulcer of buttock, right, unstageable (HCC) 12/30/2015  . Hypernatremia 12/28/2015  . Tracheostomy in  place (HCC) 12/25/2015  . Tobacco abuse 12/09/2015  . Ischemic right index finger at tip 12/09/2015  . Gastritis 12/09/2015  . Gastric tube present (LUQ) 12/09/2015  . Acute respiratory failure with hypoxemia (HCC)   . Protein-calorie malnutrition, severe 12/04/2015  . Encephalopathy acute 12/03/2015  . Pyloric stricture 11/24/2015  . Anxiety state 09/28/2015  . Hyperlipidemia   . 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 Coronary atherosclerosis of native coronary artery 04/24/2013  . Essential hypertension, benign 04/24/2013  . Other and unspecified hyperlipidemia 04/24/2013  . Central pterygium 10/30/2012  . 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 Dystrophy, Salzmann's  nodular 10/30/2012  . Cataract, nuclear sclerotic senile 10/30/2012    CMP     Component Value Date/Time   NA 133 (L) 01/30/2016 0411   NA 139 01/14/2016   K 3.9 01/30/2016 0411   CL 105 01/30/2016 0411   CO2 23 01/30/2016 0411   GLUCOSE 105 (H) 01/30/2016 0411   BUN 25 (H) 01/30/2016 0411   BUN 23 (A) 01/14/2016   CREATININE 0.58 (L) 01/30/2016 0411   CALCIUM 7.7 (L) 01/30/2016 0411   PROT 5.4 (L) 01/30/2016 0411   ALBUMIN 1.4 (L) 01/30/2016 0411   AST 19 01/30/2016 0411   ALT 16 (L) 01/30/2016 0411   ALKPHOS 122 01/30/2016 0411   BILITOT 0.4 01/30/2016 0411   GFRNONAA >60 01/30/2016 0411   GFRAA >60 01/30/2016 0411    Recent Labs  01/22/16 0511 01/25/16 0412  01/27/16 0349 01/28/16 0438 01/30/16 0411  NA 133* 131*  < > 130* 129* 133*  K 3.6 3.9  < > 4.4 4.1 3.9  CL 102 99*  < > 100* 101 105  CO2 28 27  < > 25 25 23   GLUCOSE 125* 104*  < > 137* 135* 105*  BUN 19 23*  < > 35* 31* 25*  CREATININE 0.62 0.58*  < > 0.71 0.60* 0.58*  CALCIUM 7.5* 7.9*  < > 7.8* 7.6* 7.7*  MG 2.0 1.6*  < > 1.6* 2.1 1.7  PHOS 3.8 4.1  --   --  3.7  --   < > = values in this interval not displayed.  Recent Labs  01/26/16 0449 01/28/16 0438 01/30/16 0411  AST 58* 18 19  ALT 34 20 16*  ALKPHOS 178* 146* 122  BILITOT 0.4 0.4 0.4  PROT 5.7* 5.3* 5.4*  ALBUMIN 1.7* 1.5* 1.4*    Recent Labs  01/25/16 0412 01/27/16 0009 01/27/16 0349 01/28/16 0438 01/30/16 0411  WBC 7.7 17.8* 28.2* 11.3* 7.3  NEUTROABS 2.5 14.6* 22.9*  --   --   HGB 7.8* 9.2* 8.6* 7.5* 7.5*  HCT 23.4* 27.1* 26.5* 23.2* 22.8*  MCV 88.0 95.8 90.8 92.4 89.8  PLT 369 404* 395 340 364    Recent Labs  01/11/16 01/21/16 0437 01/25/16 0423  TRIG 177* 93 119   No results found for: MICROALBUR No results found for: TSH No results found for: HGBA1C Lab Results  Component Value Date   TRIG 119 01/25/2016    Significant Diagnostic Results in last 30 days:  Dg Chest 2 View  Result Date:  01/20/2016 CLINICAL DATA:  Vomiting for 48 hours. EXAM: CHEST  2 VIEW COMPARISON:  12/11/2015 FINDINGS: Tracheostomy appliance is centered on the tracheal air column. Right upper extremity PICC line appears satisfactorily positioned. There are pleural effusions bilaterally. Probable consolidation in the left lower lobe. IMPRESSION: Bilateral pleural effusions. Left lower lobe consolidation could represent pneumonia or  atelectasis. Electronically Signed   By: Ellery Plunk M.D.   On: 01/20/2016 02:54   Dg Abd 1 View  Result Date: 01/28/2016 CLINICAL DATA:  Small bowel obstruction, abdominal distention EXAM: ABDOMEN - 1 VIEW COMPARISON:  01/21/2016 FINDINGS: Gastrostomy tube projects over the stomach. Gaseous distention of the stomach. Mildly distended mid abdominal small bowel loops, slightly increased since prior study. No free air. Prior cholecystectomy. IMPRESSION: Mild gaseous distention of the stomach and mid abdominal small bowel loops, slightly increased since prior study. Electronically Signed   By: Charlett Nose M.D.   On: 01/28/2016 13:05   Dg Abd 1 View  Result Date: 01/21/2016 CLINICAL DATA:  Abdominal distention. EXAM: ABDOMEN - 1 VIEW COMPARISON:  01/20/2016 .  CT 01/20/2016 . FINDINGS: Surgical clips and sutures right upper quadrant. NG tube and gastrostomy tube noted in stable position. Soft tissue structures are unremarkable. Interim partial resolution small bowel distention. No free air. IMPRESSION: 1. Surgical clips sutures in the right upper quadrant. NG tube and gastrostomy tube in stable position. 2.  Interim partial resolution of small bowel distention. Electronically Signed   By: Maisie Fus  Register   On: 01/21/2016 08:25   Ct Abdomen Pelvis W Contrast  Result Date: 01/20/2016 CLINICAL DATA:  Nausea vomiting for 2 days. Mid abdominal pain. Leukocytosis. EXAM: CT ABDOMEN AND PELVIS WITH CONTRAST TECHNIQUE: Multidetector CT imaging of the abdomen and pelvis was performed using the  standard protocol following bolus administration of intravenous contrast. CONTRAST:  100 mL Isovue-300 intravenous COMPARISON:  12/10/2015 FINDINGS: Lower chest: Moderate left pleural effusion. Small right pleural effusion. Atelectatic appearing lung base opacities bilaterally Hepatobiliary: No focal liver abnormality is seen. Status post cholecystectomy. No biliary dilatation. Pancreas: Unremarkable. No pancreatic ductal dilatation or surrounding inflammatory changes. Spleen: Normal in size without focal abnormality. Adrenals/Urinary Tract: Adrenal glands are unremarkable. Kidneys are normal, without renal calculi, focal lesion, or hydronephrosis. Bladder is unremarkable. Stomach/Bowel: There is abnormal dilatation of small bowel with abrupt transition to decompressed ileum. Transition point is in the abdominal right lower quadrant anteriorly. This likely represents a small bowel obstruction due to adhesions. No extraluminal air. Colon is remarkable only for uncomplicated diverticulosis. Stomach is mildly distended. Percutaneous gastrostomy appears satisfactorily positioned. Vascular/Lymphatic: The abdominal aorta is normal in caliber with moderate atherosclerotic calcification. No adenopathy in the abdomen or pelvis. Reproductive: Unremarkable Other: Scant volume ascites.  No drainable peritoneal collections. Musculoskeletal: No significant skeletal lesion. IMPRESSION: 1. Small bowel obstruction involving ileum at the anterior aspect of the right lower quadrant, probably due to adhesion. 2. Moderate left pleural effusion.  Small right pleural effusion. 3. Colonic diverticulosis. Electronically Signed   By: Ellery Plunk M.D.   On: 01/20/2016 05:34   Dg Chest Port 1 View  Result Date: 01/26/2016 CLINICAL DATA:  75 y/o  M; aspiration. EXAM: PORTABLE CHEST 1 VIEW COMPARISON:  01/21/2016 chest radiograph. FINDINGS: Stable cardiac silhouette given projection and technique. Aortic atherosclerosis with arch  calcification. Tracheostomy tube. The PICC line with tip projecting over mid SVC is stable. New ill-defined opacity in the right lung base may represent aspiration. IMPRESSION: New ill-defined opacity in the right lung base may represent aspiration. Electronically Signed   By: Mitzi Hansen M.D.   On: 01/26/2016 22:47   Dg Chest Port 1 View  Result Date: 01/21/2016 CLINICAL DATA:  Cough. EXAM: PORTABLE CHEST 1 VIEW COMPARISON:  01/20/2016. FINDINGS: Tracheostomy tube noted in stable position. PICC line noted with tip projected over superior vena cava. Heart size normal. Unchanged  mild left lower lobe infiltrate periods unchanged small bilateral pleural effusions. IMPRESSION: 1. Tracheostomy tube and right PICC line stable position. 2. Unchanged left lower lobe infiltrate and small bilateral pleural effusions. Electronically Signed   By: Maisie Fus  Register   On: 01/21/2016 11:53   Dg Abd Portable 1 View  Result Date: 01/20/2016 CLINICAL DATA:  Status post NG tube placement. EXAM: PORTABLE ABDOMEN - 1 VIEW COMPARISON:  None. FINDINGS: NG tube is identified with the side-port in the upper stomach and tip in the midbody. IMPRESSION: NG tube in good position. Electronically Signed   By: Drusilla Kanner M.D.   On: 01/20/2016 09:56   Dg Swallowing Func-speech Pathology  Result Date: 01/25/2016 Objective Swallowing Evaluation: Type of Study: MBS-Modified Barium Swallow Study Patient Details Name: CHANZE TEAGLE MRN: [id number] Date of Birth: September 19, 1940 Today's Date: 01/25/2016 Time: SLP Start Time (ACUTE ONLY): 1153-SLP Stop Time (ACUTE ONLY): 1215 SLP Time Calculation (min) (ACUTE ONLY): 22 min Past Medical History: Past Medical History: Diagnosis Date . Bilateral dry eyes  . Bile peritonitis due to gastric tube dislodgement 12/03/2015 . BPH (benign prostatic hyperplasia)  . Central pterygium of right eye  . Coronary artery disease 1997, 2009  bare mental stent right coronart artery, occlusive  followed by Dr. Garnette Scheuermann in [place] . Degenerative disc disease   LOWER BACK . Dysphonia  . ED (erectile dysfunction)  . Gastric outlet obstruction 11/24/2015 . Gastroesophageal reflux disease  . Hyperlipidemia  . Hyperopia of both eyes with astigmatism and presbyopia  . Hypertension  . Nuclear senile cataract   [facility] in [place] . Peptic ulcer  . Pneumatosis of intestines 12/11/2015 . Pneumonia   HISTORY OF IN CHILDHOOD . Posterior vitreous detachment 10/30/2012 . Salzmann's nodular dystrophy of left eye  . Urinary hesitancy  . Wears glasses  Past Surgical History: Past Surgical History: Procedure Laterality Date . CARDIAC CATHETERIZATION   . CHOLECYSTECTOMY   . ESOPHAGOGASTRODUODENOSCOPY N/A 12/01/2015  Procedure: ESOPHAGOGASTRODUODENOSCOPY (EGD) BALLOON DILATION OF DUODENAL STRICTURE;  Surgeon: Karie Soda, MD;  Location: [facility];  Service: General;  Laterality: N/A; . ESOPHAGOGASTRODUODENOSCOPY N/A 12/14/2015  Procedure: ESOPHAGOGASTRODUODENOSCOPY (EGD);  Surgeon: Vida Rigger, MD;  Location: [facility];  Service: Endoscopy;  Laterality: N/A;  Patient has tracheostomy . ESOPHAGOGASTRODUODENOSCOPY (EGD) WITH PROPOFOL N/A 07/01/2015  Procedure: ESOPHAGOGASTRODUODENOSCOPY (EGD) WITH PROPOFOL;  Surgeon: Willis Modena, MD;  [facility];  Service: Endoscopy;  Laterality: N/A; . ESOPHAGOGASTRODUODENOSCOPY (EGD) WITH PROPOFOL N/A 11/23/2015  Procedure: ESOPHAGOGASTRODUODENOSCOPY (EGD) WITH PROPOFOL;  Surgeon: Willis Modena, MD;  Location: [facility] ENDOSCOPY;  Service: Endoscopy;  Laterality: N/A;  may need to intubate . EYE SURGERY    growth on right eye removed  . IR GENERIC HISTORICAL  12/13/2015  IR REPLC DUODEN/JEJUNO TUBE PERCUT W/FLUORO 12/13/2015 [name], MD [facility]INTER[facility] . LAPAROSCOPIC GASTROSTOMY N/A 12/01/2015  Procedure: LAPAROSCOPIC PLACEMENT OF FEEDING JEJUNOSTOMY AND GASTROSTOMY TUBE;  Surgeon: Karie Soda, MD;  Location: [facility];  Service: General;  Laterality: N/A; .  LAPAROSCOPY N/A 12/03/2015  Procedure: LAPAROSCOPY DIAGNOSTIC, OMENTOPEXY, JEJUNOSTOMY, WASH OUT;  Surgeon: Karie Soda, MD;  Location: [facility];  Service: General;  Laterality: N/A; . STENT TO HEART    2 1997 AND 1 IN 1996 . TONSILLECTOMY   . XI ROBOTIC VAGOTOMY AND ANTRECTOMY N/A 09/25/2015  Procedure: XI ROBOTIC ANTERIOR AND POSTERIOR VAGOTOMY, BILROTH I  ANASTOMOSIS DOR FUNDIPLICATION, OMENTOPEXY  UPPER ENDOSCOPY;  Surgeon: Karie Soda, MD;  Location: [facility];  Service: General;  Laterality: N/A; HPI: [name] is  a 75 y.o. male with h/o CAD and GERD who presents to the Emergency Department from [facility] by EMS complaining of nausea, vomiting, and diarrhea onset onset two days ago, worsening yesterday. Pt also has associated mild abdominal pain. Per daughter, pt has been admitted to the hospital recently for surgery in his abdomen; he has been staying at [facility] since then. Daughter reported to MD that pt has been fed through a G tube which gave him diarrhea that eventually resolved with a change in formula.   Daughter also informed MD that pt gradually started feeding by mouth again x2 days with return of diarrhea along with nausea and vomiting. Daughter notes that the facility staff have tried to change his formula but with no relief to his symptoms.  Pt has known h/o dysphagia dysphonia, pna, peptic ulcer, GERD.  Last MBS done 12/29/15 showed moderate oropharyngeal dysphagia that looked marginally worse than test ono 12/17/15.  Pt left on ice chips only after oral care.  CXR 12/6 showed LLL consolidation ? ATX or pna.   Subjective: pt awake in chair Assessment / Plan / Recommendation CHL IP CLINICAL IMPRESSIONS 01/25/2016 Therapy Diagnosis Mild oral phase dysphagia;Moderate pharyngeal phase dysphagia;Mild cervical esophageal phase dysphagia Clinical Impression Clinical ImpressionSwallow function continues to be significantly weak resulting in residuals and aspiration with poor  cough mechanism.  Pt again fatigued during MBS stating "I'm tired" after only a few boluses.   Weakness results in secretion retention at pyriform sinus that mixed with barium without consistent awareness.  Compromised tongue base retraction and laryngeal elevation continues to allow laryngeal penetration and both audible and SILENT aspiration both during and after the swallow.  After swallow aspiration due to oropharyngeal residuals spilling into open airway.  Throat clearing removed trace penetrates from larynx.  Chin tuck posture decreased amount of penetration/aspiration but was not fully preventative.  Attempts to consume solids resulting in pt retching/gagging with oral transiting - cues to expectorate effective.  Pt reports gagging has been occurring with solids.  Patient appeared with decreased proximal esophageal opening/decreased CP opening resulting in residuals in pyriform sinus. Fortunately pt's swallowing ability is improved however he remains weak with inability to fully clear even mild aspirates and is aspirating secretions.   Using teach back/live video educated patient to effective compensation strategies. Recommend continue NPO except single ice chips/water with precautions using chin tuck posture and encouraging pt to "hock" to expectorate vallecular residuals and strengthen cough/voice.  Oral care, pmsv in place and cueing patient to cough/clear throat and re-swallow. Of note, pt did not recall having prior MBS x2 during last hospital admission - concerning for cognitive issues.  Pt will need repeat MBS in future due to silent nature of dysphagia. Will follow up for dysphagia treatment/management.  Thanks for allowing me to help care for this patient.    Impact on safety and function Moderate aspiration risk;Severe aspiration risk;Risk for inadequate nutrition/hydration   CHL IP TREATMENT RECOMMENDATION 01/25/2016 Treatment Recommendations Therapy as outlined in treatment plan below   Prognosis  01/25/2016 Prognosis for Safe Diet Advancement Fair Barriers to Reach Goals Cognitive deficits;Time post onset;Severity of deficits Barriers/Prognosis Comment -- CHL IP DIET RECOMMENDATION 01/25/2016 SLP Diet Recommendations Free water protocol after oral care Liquid Administration via Cup Medication Administration Via alternative means Compensations Slow rate;Small sips/bites;Multiple dry swallows after each bite/sip;Chin tuck;Other (Comment) Postural Changes Remain semi-upright after after feeds/meals (Comment);Seated upright at 90 degrees   CHL IP OTHER RECOMMENDATIONS 01/25/2016 Recommended Consults  Other (Comment) Oral Care Recommendations Oral care BID Other Recommendations Have oral suction available   CHL IP FOLLOW UP RECOMMENDATIONS 01/25/2016 Follow up Recommendations Skilled Nursing facility   [facility] IP FREQUENCY AND DURATION 01/25/2016 Speech Therapy Frequency (ACUTE ONLY) min 2x/week Treatment Duration 1 week      CHL IP ORAL PHASE 01/25/2016 Oral Phase Impaired Oral - Pudding Teaspoon -- Oral - Pudding Cup -- Oral - Honey Teaspoon -- Oral - Honey Cup -- Oral - Nectar Teaspoon Weak lingual manipulation;Delayed oral transit Oral - Nectar Cup Weak lingual manipulation;Delayed oral transit Oral - Nectar Straw Weak lingual manipulation;Delayed oral transit Oral - Thin Teaspoon WFL Oral - Thin Cup WFL Oral - Thin Straw WFL Oral - Puree Weak lingual manipulation;Piecemeal swallowing;Delayed oral transit Oral - Mech Soft Other (Comment) Oral - Regular -- Oral - Multi-Consistency -- Oral - Pill -- Oral Phase - Comment pt reports solid food sensitivity causing him to gag   CHL IP PHARYNGEAL PHASE 01/25/2016 Pharyngeal Phase Impaired Pharyngeal- Pudding Teaspoon -- Pharyngeal -- Pharyngeal- Pudding Cup -- Pharyngeal -- Pharyngeal- Honey Teaspoon -- Pharyngeal -- Pharyngeal- Honey Cup -- Pharyngeal -- Pharyngeal- Nectar Teaspoon Reduced laryngeal elevation;Reduced airway/laryngeal closure;Reduced tongue base  retraction;Penetration/Aspiration during swallow;Pharyngeal residue - valleculae;Pharyngeal residue - pyriform Pharyngeal Material enters airway, remains ABOVE vocal cords and not ejected out Pharyngeal- Nectar Cup Reduced epiglottic inversion;Reduced laryngeal elevation;Reduced airway/laryngeal closure;Reduced tongue base retraction;Penetration/Aspiration during swallow;Pharyngeal residue - valleculae;Pharyngeal residue - pyriform Pharyngeal Material enters airway, remains ABOVE vocal cords and not ejected out Pharyngeal- Nectar Straw Reduced epiglottic inversion;Reduced laryngeal elevation;Reduced airway/laryngeal closure;Reduced tongue base retraction;Penetration/Aspiration during swallow;Pharyngeal residue - valleculae;Pharyngeal residue - pyriform Pharyngeal Material enters airway, remains ABOVE vocal cords and not ejected out Pharyngeal- Thin Teaspoon Reduced epiglottic inversion;Reduced anterior laryngeal mobility;Reduced laryngeal elevation;Reduced airway/laryngeal closure;Reduced tongue base retraction;Trace aspiration;Pharyngeal residue - valleculae;Pharyngeal residue - pyriform Pharyngeal Material enters airway, passes BELOW cords without attempt by patient to eject out (silent aspiration) Pharyngeal- Thin Cup Reduced epiglottic inversion;Reduced laryngeal elevation;Reduced airway/laryngeal closure;Reduced tongue base retraction;Penetration/Aspiration during swallow;Penetration/Apiration after swallow;Trace aspiration;Significant aspiration (Amount);Pharyngeal residue - valleculae;Pharyngeal residue - pyriform Pharyngeal Material enters airway, passes BELOW cords and not ejected out despite cough attempt by patient;Material enters airway, passes BELOW cords without attempt by patient to eject out (silent aspiration) Pharyngeal- Thin Straw Reduced pharyngeal peristalsis;Reduced epiglottic inversion;Reduced laryngeal elevation;Reduced airway/laryngeal closure;Reduced tongue base retraction;Trace  aspiration;Pharyngeal residue - valleculae;Pharyngeal residue - pyriform Pharyngeal -- Pharyngeal- Puree Delayed swallow initiation-vallecula;Reduced epiglottic inversion;Reduced anterior laryngeal mobility;Reduced laryngeal elevation;Reduced tongue base retraction;Pharyngeal residue - valleculae Pharyngeal -- Pharyngeal- Mechanical Soft -- Pharyngeal -- Pharyngeal- Regular NT Pharyngeal -- Pharyngeal- Multi-consistency -- Pharyngeal -- Pharyngeal- Pill -- Pharyngeal -- Pharyngeal Comment chin tuck posture decreased amount of penetration/aspiration but did not fully eliminate it, cough was weak and did not fully clear trace aspirates, pt initially with audible aspiration but later it was silent, following solids with thin faciliated pharyngeal clearance but with aspiration of secretions/liquids=   CHL IP CERVICAL ESOPHAGEAL PHASE 01/25/2016 Cervical Esophageal Phase Impaired Pudding Teaspoon -- Pudding Cup -- Honey Teaspoon -- Honey Cup -- Nectar Teaspoon Reduced cricopharyngeal relaxation Nectar Cup Reduced cricopharyngeal relaxation Nectar Straw -- Thin Teaspoon Reduced cricopharyngeal relaxation Thin Cup Reduced cricopharyngeal relaxation Thin Straw Reduced cricopharyngeal relaxation Puree Reduced cricopharyngeal relaxation;Esophageal backflow into cervical esophagus Mechanical Soft -- Regular -- Multi-consistency -- Pill -- Cervical Esophageal Comment -- No flowsheet data found. Donavan Burnet, MS [facility] SLP [phone]               Assessment  and Plan  HYPERNATREMIA -Na+ today is 136, will dec free water to 200 cc q 12 hours; apparently flushes with meds and water in TPN is hydrating enough; K+ is 3.7 so will cont current supplementation  TPN/ GTUBE FEEDING - CO2 improved with NaAcetate in solution at 22; very cautiously optimistic about by mouth feeding    Labs/tests ordered: BMP,CBC  Time spent > 25 min Merrilee SeashoreAnne Lynea Rollison, MD

## 2016-01-30 NOTE — Progress Notes (Signed)
Subjective: Pt awake alert no complaints  Objective: Vital signs in last 24 hours: Temp:  [97.4 F (36.3 C)-98.2 F (36.8 C)] 98.2 F (36.8 C) (12/16 0607) Pulse Rate:  [80-91] 87 (12/16 0834) Resp:  [16-18] 16 (12/16 0834) BP: (117-159)/(56-61) 125/60 (12/16 0607) SpO2:  [96 %-100 %] 99 % (12/16 0834) FiO2 (%):  [21 %] 21 % (12/15 1616) Weight:  [82.5 kg (181 lb 14.4 oz)] 82.5 kg (181 lb 14.4 oz) (12/16 0607) Last BM Date:  (patient does not remember)  Intake/Output from previous day: 12/15 0701 - 12/16 0700 In: 2217.3 [I.V.:2165.6; NG/GT:1.7; IV Piggyback:50] Out: 2925 [Urine:1850; Drains:1075] Intake/Output this shift: Total I/O In: -  Out: 500 [Urine:500]  Neck: trach in place  Incision/Wound:soft ABDOMEN  Feeding tubes in place  No peritonitis   Lab Results:   Recent Labs  01/28/16 0438 01/30/16 0411  WBC 11.3* 7.3  HGB 7.5* 7.5*  HCT 23.2* 22.8*  PLT 340 364   BMET  Recent Labs  01/28/16 0438 01/30/16 0411  NA 129* 133*  K 4.1 3.9  CL 101 105  CO2 25 23  GLUCOSE 135* 105*  BUN 31* 25*  CREATININE 0.60* 0.58*  CALCIUM 7.6* 7.7*   PT/INR No results for input(s): LABPROT, INR in the last 72 hours. ABG No results for input(s): PHART, HCO3 in the last 72 hours.  Invalid input(s): PCO2, PO2  Studies/Results: Dg Abd 1 View  Result Date: 01/28/2016 CLINICAL DATA:  Small bowel obstruction, abdominal distention EXAM: ABDOMEN - 1 VIEW COMPARISON:  01/21/2016 FINDINGS: Gastrostomy tube projects over the stomach. Gaseous distention of the stomach. Mildly distended mid abdominal small bowel loops, slightly increased since prior study. No free air. Prior cholecystectomy. IMPRESSION: Mild gaseous distention of the stomach and mid abdominal small bowel loops, slightly increased since prior study. Electronically Signed   By: Charlett NoseKevin  Dover M.D.   On: 01/28/2016 13:05    Anti-infectives: Anti-infectives    Start     Dose/Rate Route Frequency Ordered Stop    01/26/16 2300  piperacillin-tazobactam (ZOSYN) IVPB 3.375 g     3.375 g 12.5 mL/hr over 240 Minutes Intravenous Every 8 hours 01/26/16 2256     01/26/16 2230  vancomycin (VANCOCIN) IVPB 1000 mg/200 mL premix  Status:  Discontinued     1,000 mg 200 mL/hr over 60 Minutes Intravenous Every 12 hours 01/26/16 2227 01/26/16 2227   01/20/16 0400  vancomycin (VANCOCIN) IVPB 1000 mg/200 mL premix  Status:  Discontinued     1,000 mg 200 mL/hr over 60 Minutes Intravenous Every 12 hours 01/20/16 0323 01/21/16 0900   01/20/16 0330  ceFEPIme (MAXIPIME) 1 g in dextrose 5 % 50 mL IVPB     1 g 100 mL/hr over 30 Minutes Intravenous  Once 01/20/16 0317 01/20/16 0735      Assessment/Plan: PSBO with 2 days of nausea and vomiting on admit 01/20/16 - S/P Robotic anterior and posterior vagotomy, BI anastomosis, DOR fundoplication, omentopexy for partial GOO 09/2015, DR. Viviann SpareSTEVEN GROSS  - Feeding Jejunostomy and gastrostomy/EGD balloon dilatation, secondary to edema at anastomosis, 12/01/15, DR. Karie SodaSteven Gross - S/p diagnostic laparoscopy, omentopexy of jejunal disruption, and washout for jejunal disruption (pt pulled out jejunostomy tube)and gastric leak. 12/03/15, Dr. Karie SodaSteven Gross (pt pulled out jejunostomy tube) - Malnutrition formerly on TF/now on TNA  - bowel function returned, continue dulcolax PRN  Aspiration PNA - aspiration w/ start of bolus TF 12/12 - WBC 11.2, trending down on Zosyn  - CBC in AM  FEN:  NPO, cyclic TPN, trickle tube feeds since tolerating? ID: cefepime 12/6 x1, vancomycin 12/6-12/7, Zosyn 12/13 >> DVT: Heparin/SCD   Plan: NPO, TPN/trickle feeds, patient placement  Recommend continuing TPN. Patient did not tolerate bolus tube feeds but has tolerated trickle TF for 24h. [facility] requesting [facility] to go [facility] - Social work to evaluate today. SNF vs [facility]? Downsize/decannulation of trach delayed due to aspiration risk - outpatient follow up in 4 weeks to  re-evaluate      LOS: 10 days    Nashawn Hillock A. 01/30/2016

## 2016-01-31 DIAGNOSIS — L98429 Non-pressure chronic ulcer of back with unspecified severity: Secondary | ICD-10-CM

## 2016-01-31 DIAGNOSIS — D638 Anemia in other chronic diseases classified elsewhere: Secondary | ICD-10-CM

## 2016-01-31 LAB — GLUCOSE, CAPILLARY
GLUCOSE-CAPILLARY: 119 mg/dL — AB (ref 65–99)
GLUCOSE-CAPILLARY: 78 mg/dL (ref 65–99)
GLUCOSE-CAPILLARY: 90 mg/dL (ref 65–99)
GLUCOSE-CAPILLARY: 93 mg/dL (ref 65–99)
Glucose-Capillary: 90 mg/dL (ref 65–99)

## 2016-01-31 MED ORDER — SODIUM CHLORIDE 0.9 % IV SOLN
25.0000 mg | Freq: Once | INTRAVENOUS | Status: AC
  Administered 2016-01-31: 25 mg via INTRAVENOUS
  Filled 2016-01-31: qty 0.5

## 2016-01-31 MED ORDER — ACETAMINOPHEN 160 MG/5ML PO SOLN
960.0000 mg | Freq: Three times a day (TID) | ORAL | Status: AC
  Administered 2016-01-31 – 2016-02-01 (×6): 960 mg
  Filled 2016-01-31 (×7): qty 40.6

## 2016-01-31 MED ORDER — IRON DEXTRAN 50 MG/ML IJ SOLN
250.0000 mg | Freq: Once | INTRAMUSCULAR | Status: AC
  Administered 2016-01-31: 250 mg via INTRAVENOUS
  Filled 2016-01-31: qty 5

## 2016-01-31 MED ORDER — TRACE MINERALS CR-CU-MN-SE-ZN 10-1000-500-60 MCG/ML IV SOLN
INTRAVENOUS | Status: AC
  Administered 2016-01-31: 18:00:00 via INTRAVENOUS
  Filled 2016-01-31 (×2): qty 2000

## 2016-01-31 MED ORDER — PANTOPRAZOLE SODIUM 40 MG PO PACK
40.0000 mg | PACK | Freq: Two times a day (BID) | ORAL | Status: DC
  Administered 2016-01-31: 40 mg
  Filled 2016-01-31 (×3): qty 20

## 2016-01-31 MED ORDER — POLYETHYLENE GLYCOL 3350 17 G PO PACK
17.0000 g | PACK | Freq: Every day | ORAL | Status: DC
  Administered 2016-01-31 – 2016-02-01 (×2): 17 g
  Filled 2016-01-31 (×2): qty 1

## 2016-01-31 MED ORDER — METOPROLOL TARTRATE 25 MG PO TABS: 12.5000 mg | ORAL_TABLET | Freq: Two times a day (BID) | ORAL | Status: DC | PRN

## 2016-01-31 MED ORDER — METOPROLOL TARTRATE 12.5 MG HALF TABLET: 12.5000 mg | ORAL_TABLET | Freq: Two times a day (BID) | ORAL | Status: DC | PRN

## 2016-01-31 MED ORDER — DIPHENHYDRAMINE HCL 50 MG/ML IJ SOLN
12.5000 mg | Freq: Four times a day (QID) | INTRAMUSCULAR | Status: DC | PRN
  Administered 2016-02-24: 25 mg via INTRAVENOUS
  Filled 2016-01-31: qty 1

## 2016-01-31 MED ORDER — NITROGLYCERIN 0.4 MG SL SUBL: 0.4000 mg | SUBLINGUAL_TABLET | SUBLINGUAL | Status: DC | PRN

## 2016-01-31 MED ORDER — GUAIFENESIN-DM 100-10 MG/5ML PO SYRP: 15.0000 mL | ORAL_SOLUTION | ORAL | Status: DC | PRN

## 2016-01-31 MED ORDER — TRAZODONE HCL 100 MG PO TABS: 100.0000 mg | ORAL_TABLET | Freq: Every day | ORAL | Status: DC

## 2016-01-31 MED ORDER — LACTATED RINGERS IV BOLUS (SEPSIS): 1000.0000 mL | Freq: Three times a day (TID) | INTRAVENOUS | Status: DC | PRN

## 2016-01-31 MED ORDER — HALOPERIDOL 0.5 MG PO TABS: 0.2500 mg | ORAL_TABLET | Freq: Three times a day (TID) | ORAL | Status: DC

## 2016-01-31 MED ORDER — ALUM & MAG HYDROXIDE-SIMETH 200-200-20 MG/5ML PO SUSP: 30.0000 mL | Freq: Four times a day (QID) | ORAL | Status: DC | PRN

## 2016-01-31 MED ORDER — FLUOXETINE HCL 20 MG/5ML PO SOLN
40.0000 mg | Freq: Every day | ORAL | Status: DC
  Administered 2016-01-31 – 2016-02-01 (×2): 40 mg
  Filled 2016-01-31 (×16): qty 10

## 2016-01-31 MED ORDER — OMEPRAZOLE 2 MG/ML ORAL SUSPENSION: 40.0000 mg | Freq: Two times a day (BID) | ORAL | Status: DC

## 2016-01-31 MED ORDER — TRAMADOL HCL 50 MG PO TABS: 50.0000 mg | ORAL_TABLET | Freq: Four times a day (QID) | ORAL | Status: DC | PRN

## 2016-01-31 MED ORDER — TRAMADOL HCL 50 MG PO TABS
50.0000 mg | ORAL_TABLET | Freq: Four times a day (QID) | ORAL | Status: DC | PRN
  Administered 2016-02-01 (×2): 100 mg
  Filled 2016-01-31 (×2): qty 2

## 2016-01-31 MED ORDER — PANTOPRAZOLE SODIUM 40 MG IV SOLR
40.0000 mg | Freq: Two times a day (BID) | INTRAVENOUS | Status: DC
  Administered 2016-01-31: 40 mg via INTRAVENOUS
  Filled 2016-01-31: qty 40

## 2016-01-31 MED ORDER — ASPIRIN 81 MG PO CHEW
81.0000 mg | CHEWABLE_TABLET | Freq: Every evening | ORAL | Status: DC
  Administered 2016-01-31 – 2016-02-01 (×2): 81 mg
  Filled 2016-01-31 (×3): qty 1

## 2016-01-31 MED ORDER — HYDRALAZINE HCL 20 MG/ML IJ SOLN: 5.0000 mg | Freq: Four times a day (QID) | INTRAMUSCULAR | Status: DC | PRN

## 2016-01-31 MED ORDER — FAT EMULSION 20 % IV EMUL
246.0000 mL | INTRAVENOUS | Status: AC
  Administered 2016-01-31: 246 mL via INTRAVENOUS
  Filled 2016-01-31: qty 250

## 2016-01-31 MED ORDER — MAGNESIUM SULFATE 2 GM/50ML IV SOLN
2.0000 g | Freq: Once | INTRAVENOUS | Status: AC
  Administered 2016-01-31: 2 g via INTRAVENOUS
  Filled 2016-01-31: qty 50

## 2016-01-31 MED ORDER — FLUOXETINE HCL 20 MG/5ML PO SOLN: 20.0000 mg | Freq: Every day | ORAL | Status: DC

## 2016-01-31 MED ORDER — FUROSEMIDE 10 MG/ML IJ SOLN
40.0000 mg | Freq: Once | INTRAMUSCULAR | Status: AC
  Administered 2016-01-31: 40 mg via INTRAVENOUS
  Filled 2016-01-31: qty 4

## 2016-01-31 NOTE — Progress Notes (Signed)
Physical Therapy Treatment Patient Details Name: [name] MRN: [id number] DOB: 25-Oct-1940 Today's Date: 01/31/2016    [facility] N/V r/o ileus    PT Comments    Pt is very quick to say "I can't", "not today".  Requires MAX encouragement to participate each time.  But once up and moving her does well and states "I went a long ways".  Assisted OOB to amb with RW this session instead B Platform Eva walker.  Pt progressing well with mobility and on RA.  Would benefit from aggressive therapist at SNF who won't let him say "no" and stay in bed.    Follow Up Recommendations  SNF     Equipment Recommendations  None recommended by PT    Recommendations for Other Services       Precautions / Restrictions Precautions Precaution Comments: NPO (X ice chips), gastric tube,TRACH uncapped on RA Restrictions Weight Bearing Restrictions: No    Mobility  Bed Mobility Overal bed mobility: Needs Assistance Bed Mobility: Supine to Sit     Supine to sit: Supervision     General bed mobility comments: able to self perform with MAX enciuragement  Transfers Overall transfer level: Needs assistance Equipment used: None Transfers: Sit to/from Stand   Stand pivot transfers: Min assist;Mod assist;+2 physical assistance;+2 safety/equipment       General transfer comment: more able to self rise.  Still needs MAX VC's on proper hand placement and to increase self push up.  Also VC's to reach back prior to sit to control decend.    Ambulation/Gait Ambulation/Gait assistance: Min assist;+2 safety/equipment Ambulation Distance (Feet): 184 Feet (122, 62 one sitting rest break) Assistive device: Rolling walker (2 wheeled) Gait Pattern/deviations: Step-to pattern;Step-through pattern Gait velocity: decreased   General Gait Details: No longer using EVA walker.  Had pt amb with RW + 2 assist for safety such that recliner follows with pt as he sits  quickly with fatigue. AVF RA 98% and HR 87.  Tolerating increased distance each session.     Stairs            Wheelchair Mobility    Modified Rankin (Stroke Patients Only)       Balance                                    Cognition Arousal/Alertness: Awake/alert Behavior During Therapy: WFL for tasks assessed/performed Overall Cognitive Status: Within Functional Limits for tasks assessed                 General Comments: some times he gets lost    Exercises      General Comments        Pertinent Vitals/Pain Pain Assessment: No/denies pain    Home Living                      Prior Function            PT Goals (current goals can now be found in the care plan section) Progress towards PT goals: Progressing toward goals    Frequency    Min 3X/week      PT Plan Current plan remains appropriate    Co-evaluation             End of Session Equipment Utilized During Treatment: Gait belt Activity Tolerance: Patient tolerated treatment well Patient left: in  chair;with call bell/phone within reach;with family/visitor present     Time: 1100-1130 PT Time Calculation (min) (ACUTE ONLY): 30 min  Charges:  $Gait Training: 8-22 mins $Therapeutic Activity: 8-22 mins                    G Codes:      Felecia ShellingLori Nhat Hearne  PTA [facility]  Acute  Rehab Pager      [phone]

## 2016-01-31 NOTE — Progress Notes (Signed)
G-tube residual 20 ml, 10 ml air infused.Pt turned and repositioned x2, pt refused the other times it was offered.

## 2016-01-31 NOTE — Progress Notes (Signed)
RT placed PT on humidified 02 for secretion mobility (Sp02 on ra 95%). RN aware.

## 2016-01-31 NOTE — Clinical Social Work Note (Signed)
[facility] does not [facility] to [facility] and has declined other potential SNF options.   RNCM has explored possibility of LTACH placement with [employer]. [employer] has denied request. Family aware and pt's dtr has been in contact with [facility].   [name] will not be discontinued before discharge.   Currently patient does not have a SNF bed at this time. MSW will continue to follow patient and family for continued support and to facilitate pt's discharge needs once medically stable.   Danny FennelBashira Vienna [name], MSW [phone] 01/31/2016 4:06 PM

## 2016-01-31 NOTE — Progress Notes (Signed)
[name]PHARMACY - ADULT TOTAL PARENTERAL NUTRITION CONSULT NOTE   Pharmacy Consult for TPN Indication: PTA TPN continuation, intolerance to enteral feed  Insulin Requirements: 0 units Novolog SSI yesterday  Current Nutrition:  - Clinimix as below  - TFs held PTA TPN from advanced home care: [facility] pt received 405 grams (1376 kcal)  of dextrose, 75 grams (300 kcal) of protein and 50 grams ( 480 kcal) of lipid for a total of 2156 kcal/day  IVF: NS at 80 ml/hr  Central access: PICC TPN start date: PTA, restarted on admission on 12/6   ASSESSMENT                                                                                                          HPI: [age] yoM with hx PUD, CAD, GERD, BPH,  s/p multiple procedures  and extended hospitalization for 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 Pharmacy consulted to resume TPN.  Significant events:  12/6: TPN continued on admission. [facility]: Pt pulled out NGT, not replaced, remains NPO 12/8: trial of ice chips 12/10: continue TPN, plan swallow study Monday 12/11, if no aspiration, will start to advance diet.   12/11: Awaiting MBS.  Having bowel function.  Continue TPN for now.   12/12: Severe aspiration risk per MBS yesterday - recommend continuing NPO. Osmolite 1.2 CAL via TF BID ordered 12/11 at 6pm. Per discussion with [facility] PA, continue TPN at current goal rate today. F/u for ability to wean tomorrow if tolerates TF. 12/13: Did not tolerate bolus feeds through G-tube; overnight emesis and aspiration event, started on Zosyn. CCS trying trickle feeds today. 12/14 transition to cyclic TPN - appears recovery may be prolonged. Not tolerating trickle feeds; G-tube to drainage.  12/15: TPN was not run at cyclic rate overnight d/t conflicting  information in TPN order. To provide patient full needed nutrition, will continue to run TPN at 83 ml/hr over 24 hr and begin 18-hr cycle with tonight's TPN 12/16: start 12h TPN cycle   Today, 01/31/2016:   Glucose - No hx DM noted, CBGs good during cycle and post-cycle - SSI q6h  Electrolytes - 12/16 labs - Na remains low but improved, Mg on lower end normal  Renal - SCr WNL  I/O = +2L 24h (drain 390ml out)  LFTs - AST/ALT, AlkPhos back down today after rising yesterday; appear to be rising and falling periodically  TGs - 177 ( 11/27), 93 (12/7), 119 (12/11)  Prealbumin - 15.3 ( 11/13), 10.8 (12/7), 12.8 (12/11)  NUTRITIONAL GOALS  RD recs (12/7): Kcal 2000-2200, Protein 100-115g, Fluid 2L/day  [facility] TPN: 2156 Kcal/day, Protein 75g/day  Clinimix E 5/15 at a goal rate of 90 ml/hr + 20% fat emulsion at  10 ml/hr to provide: 108 g/day protein, 2013 Kcal/day.  **There is currently a Acupuncturistnational backorder of Clinimix solution. To conserve supply, will keep goal rate of Clinimix 5/15 at 83 ml/hr + 20% fat emulsion at 3310ml/hr. This provides 100g/day protein, 1894 Kcal/day.  PLAN                                                                                                                           At 1800 today: Continue 12-hr TPN cycle  Clinimix E 5/15, 2000 ml given over 12h cycle as follows (d/w patient's RN):   1800 tonight: 3650ml/hr x 1 hr  1900 tonight: 190 ml/hr x 10h  0500 tomorrow (12/17): reduce rate to 50 ml/hr x 1 hr, then off  20% fat emulsion at 20.5 ml/hr x 12 hr  [facility] pharmacy currently has 2 Clinimix bags remaining of the E5/15 2L formulation.  If patient not discharged prior to Tuesday evening, the TPN formula will need to be changed   TPN to contain standard multivitamins and trace elements.  CBG coverage per cyclic rates  TPN lab panels on Mondays & Thursdays,    Continue NS at 80 ml/hr x 12 h (6a - 6p) while TPN off  Juliette Alcideustin Davier Tramell, PharmD, BCPS.   Pager: [phone] 01/31/2016 8:30 AM

## 2016-02-01 ENCOUNTER — Inpatient Hospital Stay (HOSPITAL_COMMUNITY): Payer: Medicare Other

## 2016-02-01 LAB — CBC
HEMATOCRIT: 24.7 % — AB (ref 39.0–52.0)
HEMOGLOBIN: 8.2 g/dL — AB (ref 13.0–17.0)
MCH: 30.1 pg (ref 26.0–34.0)
MCHC: 33.2 g/dL (ref 30.0–36.0)
MCV: 90.8 fL (ref 78.0–100.0)
Platelets: 409 10*3/uL — ABNORMAL HIGH (ref 150–400)
RBC: 2.72 MIL/uL — ABNORMAL LOW (ref 4.22–5.81)
RDW: 17.3 % — ABNORMAL HIGH (ref 11.5–15.5)
WBC: 8.8 10*3/uL (ref 4.0–10.5)

## 2016-02-01 LAB — GLUCOSE, CAPILLARY
GLUCOSE-CAPILLARY: 136 mg/dL — AB (ref 65–99)
GLUCOSE-CAPILLARY: 82 mg/dL (ref 65–99)
GLUCOSE-CAPILLARY: 91 mg/dL (ref 65–99)
GLUCOSE-CAPILLARY: 121 mg/dL — AB (ref 65–99)
GLUCOSE-CAPILLARY: 130 mg/dL — AB (ref 65–99)

## 2016-02-01 LAB — DIFFERENTIAL
BASOS ABS: 0.1 10*3/uL (ref 0.0–0.1)
Basophils Relative: 1 %
Eosinophils Absolute: 0.3 10*3/uL (ref 0.0–0.7)
Eosinophils Relative: 4 %
LYMPHS ABS: 2.9 10*3/uL (ref 0.7–4.0)
Lymphocytes Relative: 32 %
MONOS PCT: 14 %
Monocytes Absolute: 1.3 10*3/uL — ABNORMAL HIGH (ref 0.1–1.0)
NEUTROS ABS: 4.3 10*3/uL (ref 1.7–7.7)
Neutrophils Relative %: 49 %

## 2016-02-01 LAB — TRIGLYCERIDES: Triglycerides: 148 mg/dL (ref <150)

## 2016-02-01 LAB — COMPREHENSIVE METABOLIC PANEL
ALBUMIN: 1.5 g/dL — AB (ref 3.5–5.0)
ALT: 43 U/L (ref 17–63)
ANION GAP: 5 (ref 5–15)
AST: 36 U/L (ref 15–41)
Alkaline Phosphatase: 168 U/L — ABNORMAL HIGH (ref 38–126)
BUN: 29 mg/dL — ABNORMAL HIGH (ref 6–20)
CHLORIDE: 104 mmol/L (ref 101–111)
CO2: 23 mmol/L (ref 22–32)
CREATININE: 0.77 mg/dL (ref 0.61–1.24)
Calcium: 8 mg/dL — ABNORMAL LOW (ref 8.9–10.3)
GFR calc non Af Amer: >60 mL/min (ref >60)
Glucose, Bld: 126 mg/dL — ABNORMAL HIGH (ref 65–99)
Potassium: 3.7 mmol/L (ref 3.5–5.1)
SODIUM: 132 mmol/L — AB (ref 135–145)
Total Bilirubin: 0.6 mg/dL (ref 0.3–1.2)
Total Protein: 5.4 g/dL — ABNORMAL LOW (ref 6.5–8.1)

## 2016-02-01 LAB — PREALBUMIN: Prealbumin: 13.1 mg/dL — ABNORMAL LOW (ref 18–38)

## 2016-02-01 LAB — PHOSPHORUS: PHOSPHORUS: 4.6 mg/dL (ref 2.5–4.6)

## 2016-02-01 LAB — MAGNESIUM: Magnesium: 1.8 mg/dL (ref 1.7–2.4)

## 2016-02-01 MED ORDER — TRACE MINERALS CR-CU-MN-SE-ZN 10-1000-500-60 MCG/ML IV SOLN
INTRAVENOUS | Status: AC
  Administered 2016-02-01: 17:00:00 via INTRAVENOUS
  Filled 2016-02-01 (×2): qty 2000

## 2016-02-01 MED ORDER — PANTOPRAZOLE SODIUM 40 MG PO PACK
40.0000 mg | PACK | Freq: Every day | ORAL | Status: DC
Start: 2016-02-01 — End: 2016-02-05
  Administered 2016-02-01: 40 mg
  Filled 2016-02-01 (×5): qty 20

## 2016-02-01 MED ORDER — FAT EMULSION 20 % IV EMUL
246.0000 mL | INTRAVENOUS | Status: AC
  Administered 2016-02-01: 246 mL via INTRAVENOUS
  Filled 2016-02-01: qty 250

## 2016-02-01 MED ORDER — VITAL 1.5 CAL PO LIQD
1000.0000 mL | ORAL | Status: DC
  Administered 2016-02-01: 1000 mL
  Filled 2016-02-01: qty 1000

## 2016-02-01 MED ORDER — VITAL HIGH PROTEIN PO LIQD: 1000.0000 mL | ORAL | Status: DC

## 2016-02-01 NOTE — Progress Notes (Signed)
Patient ID: [name], male   DOB: [birth year], 75 y.o.   MRN: [id number]  [facility]     Subjective: No complaints today. Patient reports having BM yesterday.  Unfortunately he has no bed placement at this time.  Objective: Vital signs in last 24 hours: Temp:  [97.6 F (36.4 C)-98.1 F (36.7 C)] 97.6 F (36.4 C) (12/18 0630) Pulse Rate:  [72-91] 84 (12/18 0630) Resp:  [16-18] 16 (12/18 0630) BP: (119-151)/(41-59) 119/41 (12/18 0630) SpO2:  [93 %-98 %] 93 % (12/18 0630) FiO2 (%):  [28 %] 28 % (12/18 0314) Weight:  [171 lb 9.6 oz (77.8 kg)] 171 lb 9.6 oz (77.8 kg) (12/18 0630) Last BM Date:  (Pt doesn't remember)  Intake/Output from previous day: 12/17 0701 - 12/18 0700 In: 2596 [I.V.:1776; NG/GT:570; IV Piggyback:150] Out: 3190 [Urine:2840; Drains:350] Intake/Output this shift: No intake/output data recorded.  PE: Gen:  Alert, NAD, pleasant and cooperative HEENT: trach Card:  RRR, no M/G/R appreciated Pulm: unlabored, CTAB, no W/R/R, no cough Abd: Soft, NT/ND, +BS, G tube site c/d/i and to gravity, laparoscopic incision sites healing well Ext:  No erythema or tenderness, equal and nontender pedal edema bilaterally  Lab Results:   Recent Labs  01/30/16 0411 02/01/16 0406  WBC 7.3 8.8  HGB 7.5* 8.2*  HCT 22.8* 24.7*  PLT 364 409*   BMET  Recent Labs  01/30/16 0411 02/01/16 0406  NA 133* 132*  K 3.9 3.7  CL 105 104  CO2 23 23  GLUCOSE 105* 126*  BUN 25* 29*  CREATININE 0.58* 0.77  CALCIUM 7.7* 8.0*   PT/INR No results for input(s): LABPROT, INR in the last 72 hours. CMP     Component Value Date/Time   NA 132 (L) 02/01/2016 0406   NA 139 01/14/2016   K 3.7 02/01/2016 0406   CL 104 02/01/2016 0406   CO2 23 02/01/2016 0406   GLUCOSE 126 (H) 02/01/2016 0406   BUN 29 (H) 02/01/2016 0406   BUN 23 (A) 01/14/2016   CREATININE 0.77 02/01/2016 0406   CALCIUM 8.0 (L) 02/01/2016 0406   PROT 5.4 (L) 02/01/2016 0406   ALBUMIN 1.5 (L) 02/01/2016 0406   AST 36 02/01/2016 0406   ALT 43 02/01/2016 0406   ALKPHOS 168 (H) 02/01/2016 0406   BILITOT 0.6 02/01/2016 0406   GFRNONAA >60 02/01/2016 0406   GFRAA >60 02/01/2016 0406   Lipase     Component Value Date/Time   LIPASE 55 (H) 01/20/2016 0158       Studies/Results: No results found.  Anti-infectives: Anti-infectives    Start     Dose/Rate Route Frequency Ordered Stop   01/26/16 2300  piperacillin-tazobactam (ZOSYN) IVPB 3.375 g     3.375 g 12.5 mL/hr over 240 Minutes Intravenous Every 8 hours 01/26/16 2256     01/26/16 2230  vancomycin (VANCOCIN) IVPB 1000 mg/200 mL premix  Status:  Discontinued     1,000 mg 200 mL/hr over 60 Minutes Intravenous Every 12 hours 01/26/16 2227 01/26/16 2227   01/20/16 0400  vancomycin (VANCOCIN) IVPB 1000 mg/200 mL premix  Status:  Discontinued     1,000 mg 200 mL/hr over 60 Minutes Intravenous Every 12 hours 01/20/16 0323 01/21/16 0900   01/20/16 0330  ceFEPIme (MAXIPIME) 1 g in dextrose 5 % 50 mL IVPB     1 g 100 mL/hr over 30 Minutes Intravenous  Once 01/20/16 0317 01/20/16 0735       Assessment/Plan PSBO with 2 days  of nausea and vomiting on admit 01/20/16 - S/P Robotic anterior and posterior vagotomy, BI anastomosis, DOR fundoplication, omentopexy for partial GOO 09/2015, DR. Viviann SpareSTEVEN GROSS  - Feeding Jejunostomy and gastrostomy/EGD balloon dilatation, secondary to edema at anastomosis, 12/01/15, DR. Karie SodaSteven Gross - S/p diagnostic laparoscopy, omentopexy of jejunal disruption, and washout for jejunal disruption (pt pulled out jejunostomy tube)and gastric leak. 12/03/15, Dr. Karie SodaSteven Gross (pt pulled out jejunostomy tube) - BM yesterday, continue dulcolax PRN  Aspiration PNA - aspiration w/ start of bolus TF 12/12 - WBC WNL today 8.8 - zosyn day #6  Severe protein calorie malnutrition - prealbumin slightly up from previous, 13.1  FEN: NPO, cyclic TPN, trickle tube feeds at 715mL/hr ID: cefepime 12/6  x1, vancomycin 12/6-12/7, Zosyn 12/13 >> DVT: Heparin/SCD   Plan: XR shows some improvement in bowel dilation. Continue NPO, cyclic TPN per pharmacy (over 12 hours). Patient tolerated trickle TF x24 hour last week; will restart today at 485mL/hr. Encourage independence and mobilization. Awaiting patient placement (no bed placement as of yesterday). Downsize/decannulation of trach postponed, patient has outpatient follow-up in 4 weeks for re-evaluation   LOS: 12 days    Edson SnowballBROOKE A MILLER , [facility] 02/01/2016, 8:04 AM Pager: [phone] Consults: [phone] Mon-Fri 7:00 am-4:30 pm Sat-Sun 7:00 am-11:30 am

## 2016-02-01 NOTE — Progress Notes (Signed)
Physical Therapy Treatment Patient Details Name: Danny GalRalph T Blash MRN: [id number] DOB: 1940/02/23 Today's Date: 02/01/2016    [facility] N/V r/o ileus    PT Comments    Pt looking much better physically with noted decreased facial/UE/LE edema Daughter assisted with amb pt in hallway and increased distance on RA.   Follow Up Recommendations  SNF     Equipment Recommendations       Recommendations for Other Services       Precautions / Restrictions Precautions Precautions: Fall Precaution Comments: NPO (X ice chips), gastric tube,TRACH uncapped on RA Restrictions Weight Bearing Restrictions: No    Mobility  Bed Mobility               General bed mobility comments: OOB in recliner  Transfers Overall transfer level: Needs assistance Equipment used: Rolling walker (2 wheeled) Transfers: Sit to/from Stand   Stand pivot transfers: Min assist       General transfer comment: more able to self rise.  Still needs MAX VC's on proper hand placement and to increase self push up.  Also VC's to reach back prior to sit to control decend.    Ambulation/Gait Ambulation/Gait assistance: Min assist;+2 safety/equipment Ambulation Distance (Feet): 285 Feet (105, 85, 65    sitting rest break between) Assistive device: Rolling walker (2 wheeled) Gait Pattern/deviations: Step-to pattern;Step-through pattern Gait velocity: decreased   General Gait Details: No longer using EVA walker.  Had pt amb with RW + 2 assist for safety such that recliner follows with pt as he sits quickly with fatigue. AVF RA 98% and HR 87.  Tolerating increased distance each session.     Stairs            Wheelchair Mobility    Modified Rankin (Stroke Patients Only)       Balance                                    Cognition Arousal/Alertness: Awake/alert Behavior During Therapy: WFL for tasks assessed/performed Overall Cognitive  Status: Within Functional Limits for tasks assessed                 General Comments: some times he gets lost    Exercises      General Comments        Pertinent Vitals/Pain Pain Assessment: No/denies pain    Home Living                      Prior Function            PT Goals (current goals can now be found in the care plan section) Progress towards PT goals: Progressing toward goals    Frequency           PT Plan Current plan remains appropriate    Co-evaluation             End of Session Equipment Utilized During Treatment: Gait belt Activity Tolerance: Patient tolerated treatment well Patient left: in chair;with call bell/phone within reach;with family/visitor present     Time: 1400-1425 PT Time Calculation (min) (ACUTE ONLY): 25 min  Charges:  $Gait Training: 8-22 mins $Therapeutic Activity: 8-22 mins                    G Codes:      Michalina Calbert  PTA [facility]  Acute  Rehab Pager      [phone]

## 2016-02-01 NOTE — Consult Note (Signed)
WOC Nurse wound follow up Wound type:MASD to buttock is now clear, intact.  No erythema, induration or break in integumentary system.  Peritube skin is without erythema, skin breakdown or pain.  Present POC will remain in place. Patient is on a low air low mattress and patient and wife are taught that this must remain in place to sustain the skin care gains.  Patient is not able to turn easily from side to side and prefers to stay on back.  Is incontinent and continues at high risk for skin breakdown despite consistent application of preventive measures. Measurement:N/A Wound bed:N/A Drainage (amount, consistency, odor) Minor leakage from peritube site is managed with silver hydrofiber strip, zinc oxide, drain tube sponges and silicone foam. Periwound:Intact, dry Dressing procedure/placement/frequency:Interventions in place are currently meeting skin care needs. WOC nursing team will not follow closely, but will remain available to this patient, the nursing and medical teams.  Please re-consult if needed in-between visits. Thanks, Ladona MowLaurie Darvin Dials, MSN, RN, GNP, Hans EdenCWOCN, CWON-AP, FAAN  Pager# [phone] Conservative sharp wound debridement (CSWD performed at the bedside):

## 2016-02-01 NOTE — Progress Notes (Signed)
Nutrition Follow-up  DOCUMENTATION CODES:   Severe malnutrition in context of acute illness/injury  INTERVENTION:   - TPN per Pharmacy. - Recommend re-start trickle TF via G-tube as soon as medically feasible (recommend Vital 1.5 @ 20 mL/hr). - RD will continue to follow  NUTRITION DIAGNOSIS:   Inadequate oral intake related to inability to eat as evidenced by NPO status.  Ongoing.  GOAL:   Patient will meet greater than or equal to 90% of their needs  Meeting with TPN.  MONITOR:   Weight trends, Labs, Skin, I & O's, Other (Comment) (TPN regimen, ability to re-start TF via G-tube)  ASSESSMENT:   75 year-old male with a medical history of GERD, PUD, and CAD who presented to [facility] ED from [facility] via EMS with a cc of nausea, vomiting, and diarrhea. Patient is s/p Robotic anterior and posterior vagotomy, BI anastomosis, DOR fundoplication, omentopexy 09/2015 for partial GOO secondary to pyloric stricture, followed by placement of feeding jejunostomy and gastrostomy tube and EGD balloon dilation of duodenal stricture 12/01/15 for recurrent GOO secondary to edema at anastomosis and failure to thrive with malnutrition. On 12/03/15 the patient was taken to the OR for a diagnostic laparoscopy, omentopexy of jejunal disruption, and washout for jejunal disruption (pt pulled out jejunostomy tube) and gastric leak.   Per surgery and discussion with Pharmacy, pt may restart trickle feeds via g-tube pending culture results. Recommendations above if started later today. Pt continuing 12 hour TPN cycles.  Will continue to monitor tolerance and ability to advance TF.  Medications: Protonix suspension daily, Miralax packet daily Labs reviewed: CBGs: 82-91 Low Na Mg/Phos WNL TG: 148 mg/dL  Plan per Pharmacy 16/1012/18: At 1800 today: Continue 12-hr TPN cycle  Clinimix E 5/15, 2000 ml given over 12h cycle as follows (d/w patient's RN):  ? 1800 tonight: 7050ml/hr x 1  hr ? 1900 tonight: 190 ml/hr x 10h ? 0500 tomorrow (12/17): reduce rate to 50 ml/hr x 1 hr, then off  20% fat emulsion at 20.5 ml/hr x 12 hr  Diet Order:  Diet NPO time specified Except for: Ice Chips, Other (See Comments) TPN (CLINIMIX-E) Adult  Skin:    Stage II sacral pressure injury   Last BM:  12/14 PM (small)  Height:   Ht Readings from Last 1 Encounters:  01/21/16 6\' 2"  (1.88 m)    Weight:   Wt Readings from Last 1 Encounters:  02/01/16 171 lb 9.6 oz (77.8 kg)    Ideal Body Weight:  86.4 kg  BMI:  Body mass index is 22.03 kg/m.  Estimated Nutritional Needs:   Kcal:  2000-2200  Protein:  100-115g  Fluid:  2L/day  EDUCATION NEEDS:   No education needs identified at this time  Tilda FrancoLindsey Arihana Ambrocio, MS, RD, LDN Pager: [phone] After Hours Pager: [phone]

## 2016-02-01 NOTE — Progress Notes (Signed)
Occupational Therapy Treatment Patient Details Name: [name] MRN: [id number] DOB: June 13, 1940 Today's Date: 02/01/2016    History of present illness re admit from [facility] Dx N/V r/o ileus   OT comments  Focused on BUE exercise/ ROM and strengthening this OT visit  Follow Up Recommendations  SNF    Equipment Recommendations  3 in 1 bedside commode    Recommendations for Other Services      Precautions / Restrictions Precautions Precautions: Fall Precaution Comments: NPO (X ice chips), gastric tube,TRACH uncapped on RA Restrictions Weight Bearing Restrictions: No       Mobility Bed Mobility               General bed mobility comments: OOB in recliner  Transfers Overall transfer level: Needs assistance Equipment used: Rolling walker (2 wheeled) Transfers: Sit to/from Stand   Stand pivot transfers: Min assist       General transfer comment: more able to self rise.  Still needs MAX VC's on proper hand placement and to increase self push up.  Also VC's to reach back prior to sit to control decend.          ADL Overall ADL's : Needs assistance/impaired     Grooming: Wash/dry face;Set up;Sitting                                 General ADL Comments: pt focused on BUE exercise this day using a an IV bag for a weight.  Discussed using 2 pound weights next time- daughter will bring in.                  Cognition   Behavior During Therapy: WFL for tasks assessed/performed Overall Cognitive Status: Within Functional Limits for tasks assessed                  General Comments: some times he gets lost      Exercises General Exercises - Upper Extremity Shoulder Flexion: AROM;10 reps;Both;Other (comment);Strengthening (2 sets) Shoulder ABduction: AROM;Right;Left;Seated;Other (comment) (2 sets) Elbow Flexion: AROM;Left;Both;Strengthening;Other (comment) (2 sets) Elbow Extension: AROM;10 reps;Both;Other (comment) (2 sets)    Shoulder Instructions            Pertinent Vitals/ Pain       Pain Assessment: No/denies pain         Frequency  Min 2X/week        Progress Toward Goals  OT Goals(current goals can now be found in the care plan section)  Progress towards OT goals: Progressing toward goals     Plan Discharge plan remains appropriate       End of Session Equipment Utilized During Treatment: Rolling walker   Activity Tolerance Patient tolerated treatment well   Patient Left in chair;with call bell/phone within reach;with family/visitor present   Nurse Communication Mobility status          Charges: OT General Charges $OT Visit: 1 Procedure OT Treatments $Therapeutic Exercise: 8-22 mins  [name], Metro KungLorraine [name] 02/01/2016, 5:47 PM

## 2016-02-01 NOTE — Progress Notes (Signed)
[name]PHARMACY - ADULT TOTAL PARENTERAL NUTRITION CONSULT NOTE   Pharmacy Consult for TPN Indication: PTA TPN continuation, intolerance to enteral feed  Insulin Requirements: 0 units Novolog SSI yesterday  Current Nutrition:  - Clinimix as below  - TFs held PTA TPN from advanced home care: [facility] pt received 405 grams (1376 kcal)  of dextrose, 75 grams (300 kcal) of protein and 50 grams ( 480 kcal) of lipid for a total of 2156 kcal/day  IVF: NS at 80 ml/hr 6a to 6P when TPN off  Central access: PICC TPN start date: PTA, restarted on admission on 12/6   ASSESSMENT                                                                                                          HPI: [age] yoM with hx PUD, CAD, GERD, BPH,  s/p multiple procedures  and extended hospitalization for recurrent partial GOO 2/2 pyloric stricture on TPN for several days, discharged 11/15 with J/G tube in place on TFs, unable to tolerate and had to transition back [facility] on 11[facility], readmitted 12/6 with partial SBO. Pharmacy consulted to resume TPN.  Significant events:  12/6: TPN continued on admission. [facility] 12/7: Pt pulled out NGT, not replaced, remains NPO 12/8: trial of ice chips 12/10: continue TPN, plan swallow study Monday 12/11, if no aspiration, will start to advance diet.   12/11: Awaiting MBS.  Having bowel function.  Continue TPN for now.   12/12: Severe aspiration risk per MBS yesterday - recommend continuing NPO. Osmolite 1.2 CAL via TF BID ordered 12/11 at 6pm. Per discussion with CCS PA, continue TPN at current goal rate today. F/u for ability to wean tomorrow if tolerates TF. 12/13: Did not tolerate bolus feeds through G-tube; overnight emesis and aspiration event, started on Zosyn. CCS trying trickle feeds today. 12/14 transition to cyclic TPN - appears recovery may be prolonged. Not tolerating trickle feeds; G-tube to drainage.  12/15: TPN was not run at cyclic rate  overnight d/t conflicting information in TPN order. To provide patient full needed nutrition, will continue to run TPN at 83 ml/hr over 24 hr and begin 18-hr cycle with tonight's TPN 12/16: start 12h TPN cycle   Today, 02/01/2016:   Glucose - No hx DM noted, CBGs good during cycle and post-cycle - SSI q6h  Electrolytes - 12/18 labs - Na remains low but improved, Mg low end of normal  Renal - SCr WNL  I/O = +2L 24h (drain 390ml out)  LFTs - AST/ALT, AlkPhos back down today after rising yesterday; appear to be rising and falling periodically  TGs - 177 ( 11/27), 93 (12/7), 119 (12/11) 148 (12/18)  Prealbumin - 15.3 ( 11/13), 10.8 (12/7), 12.8 (12/11) 13.1 (12/18)  NUTRITIONAL GOALS  RD recs (12/7): Kcal 2000-2200, Protein 100-115g, Fluid 2L/day  [facility] TPN: 2156 Kcal/day, Protein 75g/day  Clinimix E 5/15 at a goal rate of 90 ml/hr + 20% fat emulsion at  10 ml/hr to provide: 108 g/day protein, 2013 Kcal/day.  **There is currently a Acupuncturistnational backorder of Clinimix solution **  PLAN                                                                                                                           At 1800 today: Continue 12-hr TPN cycle  Clinimix E 5/15, 2000 ml given over 12h cycle as follows (d/w patient's RN):   1800 tonight: 4650ml/hr x 1 hr  1900 tonight: 190 ml/hr x 10h  0500 tomorrow (12/17): reduce rate to 50 ml/hr x 1 hr, then off  20% fat emulsion at 20.5 ml/hr x 12 hr  [facility] pharmacy currently now has 0 Clinimix bags remaining of the E5/15 2L formulation.  If patient not discharged prior to Tuesday evening, the TPN formula will need to be changed   TPN to contain standard multivitamins and trace elements.  CBG coverage per cyclic rates  TPN lab panels on Mondays & Thursdays,   Continue NS at 80 ml/hr x 12 h (6a - 6p) while TPN off  Otho BellowsGreen, Javarious Elsayed L PharmD Pager  [phone] 02/01/2016, 10:12 AM

## 2016-02-02 ENCOUNTER — Inpatient Hospital Stay (HOSPITAL_COMMUNITY): Payer: Medicare Other

## 2016-02-02 DIAGNOSIS — E43 Unspecified severe protein-calorie malnutrition: Secondary | ICD-10-CM

## 2016-02-02 DIAGNOSIS — Z930 Tracheostomy status: Secondary | ICD-10-CM

## 2016-02-02 LAB — GLUCOSE, CAPILLARY
GLUCOSE-CAPILLARY: 90 mg/dL (ref 65–99)
GLUCOSE-CAPILLARY: 90 mg/dL (ref 65–99)
Glucose-Capillary: 81 mg/dL (ref 65–99)

## 2016-02-02 MED ORDER — FAT EMULSION 20 % IV EMUL
120.0000 mL | INTRAVENOUS | Status: AC
  Administered 2016-02-02: 120 mL via INTRAVENOUS
  Filled 2016-02-02: qty 200

## 2016-02-02 MED ORDER — SODIUM CHLORIDE 0.9 % IV SOLN: 30.0000 meq | Freq: Once | INTRAVENOUS | Status: DC

## 2016-02-02 MED ORDER — POTASSIUM CHLORIDE 10 MEQ/100ML IV SOLN
10.0000 meq | INTRAVENOUS | Status: AC
  Administered 2016-02-02 (×3): 10 meq via INTRAVENOUS
  Filled 2016-02-02 (×3): qty 100

## 2016-02-02 MED ORDER — FUROSEMIDE 10 MG/ML IJ SOLN
40.0000 mg | Freq: Once | INTRAMUSCULAR | Status: AC
  Administered 2016-02-02: 40 mg via INTRAVENOUS
  Filled 2016-02-02: qty 4

## 2016-02-02 MED ORDER — TRACE MINERALS CR-CU-MN-SE-ZN 10-1000-500-60 MCG/ML IV SOLN
INTRAVENOUS | Status: AC
  Administered 2016-02-02: 19:00:00 via INTRAVENOUS
  Filled 2016-02-02 (×2): qty 2000

## 2016-02-02 NOTE — Progress Notes (Signed)
[name]  Subjective:   Asked to see again d/t new left effusion   Objective: Vitals:   02/01/16 2313 02/02/16 0351 02/02/16 0453 02/02/16 0915  BP:   (!) 119/40   Pulse: 85 87 94   Resp: 16 16 18    Temp:   98.2 F (36.8 C)   TempSrc:   Oral   SpO2: 97% 94% 98%   Weight:    178 lb 9.6 oz (81 kg)  Height:         Exam: Awake, alert, resting quietly in bed, no distress Tracheostomy in place, no drainage nearby, PMV in place  Lungs clear with normal effort, decreased on the left  Cardiovascular regular rate and rhythm no murmurs gallops or rubs Neurologic: No distress, spontaneous movement of ext's    01/21/2016 chest x-ray with very mild left lower lobe airspace disease 12/11 SLP Eval >> mild oral phase dysphagia, moderate pharyngeal phase dysphagia, mild cervical esophageal dysphagia   Impression:  Tracheostomy status Small bowel obstruction Recurrent aspiration  Severe protein calorie malnutrition New left pleural effusion  FTT  Discussion:  Effusions:  This is a 75 year old male who is trach/PEG and bed bound in the setting of recurrent SBO in spite of prior surgical interventions. He is not tolerating trickle feeds and aspirates w/ swallowing trials. In regards to the new left effusion this is free flowing and almost certainly a transudate and the  result of his severe malnutrition. In this setting thoracentesis would not be helpful as it will almost certainly return. We can try to treat this w/ low dose diuretics as long as his BUN/creatinine allow.   Janina Mayorach If he gets stronger her could certainly be decannulated. The trach does not really prevent aspiration. The larger concern here is how weak he is and poor cough mechanics which will  Lend to mucous plugging and atx. Until his core strength and cough mechanics are better think trach to facilitate PRN suctioning should stay.   Goals In regards to the big picture I am concerned about what our  overall goals are here. He has progressively declined since his last hospitalization. Is he going to be TNA dependent indef? Is there a chance he could eventually get a J tube when he after a "tune up" period? Certainly life long TNA not a great option. We have encouraged the pt and his wife to discuss goals further. If there is no real chance that he will ever use his GI route again they really need to explore end-of life goals of care or at least define some end-points as to when complications or further deterioration occur in the future what will our goals be.    Plan: Lasix x 1. No thora No decannulation at this time > discussed trach issues with wife, she indicates understanding ATC as needed to support sats >92% Recommend continuing PMV during daytime for now, off at night Follow up in trach clinic in one month (arranged) for review for possible decannulation Continue zosyn per primary MD Need to define surgical goals re: when or IF the GI route can ever be used or is J tube an option? At what point would he be eligible for this?  Would benefit from palliative consult IF we think he will never use his GI route again.   Simonne MartinetPeter E Onie Hayashi ACNP-BC [facility] Pulmonary/Critical Care Pager # [phone] OR # [phone] if no answer

## 2016-02-02 NOTE — Progress Notes (Signed)
LCSWA spoke to patient wife this a.m in regards to pt. Disposition. LCSWA explained the patient has been [facility]of[facility] [place]. The patient wife not agreeable for patient to return. Patient second option is home. Patient wife reports she wants the her husband to stay at hospital to continue care. She reports spoke to [employer] that told her pt. Has several days to use at hospital.LCSWA discussed situation with RNCM . LCSWA will continue to support and assist placement if family is agreeable.   Vivi BarrackNicole Shaka Cardin, Theresia MajorsLCSWA, MSW Clinical Social Worker 5E and Psychiatric Service Line [phone]

## 2016-02-02 NOTE — Progress Notes (Signed)
Patient ID: Ardyth GalRalph T Cuffee, male   DOB: December 28, 1940, 75 y.o.   MRN: [id number]  [facility] Note     Subjective: Trickle TF started yesterday at 865mL/hr. Patient with n/v over night. RT called and suctioned patient. This morning he has no complaints.   Objective: Vital signs in last 24 hours: Temp:  [97.7 F (36.5 C)-98.2 F (36.8 C)] 98.2 F (36.8 C) (12/19 0453) Pulse Rate:  [82-94] 94 (12/19 0453) Resp:  [16-18] 18 (12/19 0453) BP: (119-122)/(40-50) 119/40 (12/19 0453) SpO2:  [94 %-98 %] 98 % (12/19 0453) FiO2 (%):  [21 %] 21 % (12/18 2313) Weight:  [178 lb 9.6 oz (81 kg)] 178 lb 9.6 oz (81 kg) (12/19 0915) Last BM Date: 02/01/16  Intake/Output from previous day: 12/18 0701 - 12/19 0700 In: 3048 [I.V.:2551.9; NG/GT:26.1; IV Piggyback:50] Out: 785 [Urine:575; Emesis/NG output:150; Drains:60] Intake/Output this shift: No intake/output data recorded.  PE: Gen: Alert, NAD, pleasant and cooperative HEENT: trach Card: RRR, no M/G/R appreciated Pulm: unlabored,CTAB, no W/R/R, no cough Abd: Soft, NT/ND, +BS, G tube site c/d/i and to gravity, laparoscopic incision sites healing well Ext: No erythema or tenderness, equal and nontender pedal edema bilaterally  Lab Results:   Recent Labs  02/01/16 0406  WBC 8.8  HGB 8.2*  HCT 24.7*  PLT 409*   BMET  Recent Labs  02/01/16 0406  NA 132*  K 3.7  CL 104  CO2 23  GLUCOSE 126*  BUN 29*  CREATININE 0.77  CALCIUM 8.0*   PT/INR No results for input(s): LABPROT, INR in the last 72 hours. CMP     Component Value Date/Time   NA 132 (L) 02/01/2016 0406   NA 139 01/14/2016   K 3.7 02/01/2016 0406   CL 104 02/01/2016 0406   CO2 23 02/01/2016 0406   GLUCOSE 126 (H) 02/01/2016 0406   BUN 29 (H) 02/01/2016 0406   BUN 23 (A) 01/14/2016   CREATININE 0.77 02/01/2016 0406   CALCIUM 8.0 (L) 02/01/2016 0406   PROT 5.4 (L) 02/01/2016 0406   ALBUMIN 1.5 (L) 02/01/2016 0406   AST 36 02/01/2016 0406    ALT 43 02/01/2016 0406   ALKPHOS 168 (H) 02/01/2016 0406   BILITOT 0.6 02/01/2016 0406   GFRNONAA >60 02/01/2016 0406   GFRAA >60 02/01/2016 0406   Lipase     Component Value Date/Time   LIPASE 55 (H) 01/20/2016 0158       Studies/Results: Dg Abd Acute W/chest  Result Date: 02/01/2016 CLINICAL DATA:  Pneumonia. Ileus following gastrointestinal surgery. EXAM: DG ABDOMEN ACUTE W/ 1V CHEST COMPARISON:  01/28/2016 FINDINGS: Tracheostomy in good position.  Right arm PICC in the mid SVC. Left lower lobe airspace disease and small left effusion unchanged from 01/26/2016. Possible pneumonia. Mild right lower lobe airspace disease also unchanged. Negative for heart failure. Gastrostomy tube overlies the stomach. Surgical clips along the gastric antrum and in the gallbladder fossa. Interval improvement in gaseous distention of stomach and large bowel. Air-fluid levels in large and small bowel on the decubitus view. No free air. IMPRESSION: Bibasilar airspace disease and small left effusion unchanged. Possible pneumonia. Improvement in bowel dilatation since prior study. Air-fluid levels remain in large and small bowel loops on the decubitus view suggesting gastroenteritis or ileus. No free air. Electronically Signed   By: Marlan Palauharles  Clark M.D.   On: 02/01/2016 09:39    Anti-infectives: Anti-infectives    Start     Dose/Rate Route Frequency Ordered Stop   01/26/16  2300  piperacillin-tazobactam (ZOSYN) IVPB 3.375 g     3.375 g 12.5 mL/hr over 240 Minutes Intravenous Every 8 hours 01/26/16 2256     01/26/16 2230  vancomycin (VANCOCIN) IVPB 1000 mg/200 mL premix  Status:  Discontinued     1,000 mg 200 mL/hr over 60 Minutes Intravenous Every 12 hours 01/26/16 2227 01/26/16 2227   01/20/16 0400  vancomycin (VANCOCIN) IVPB 1000 mg/200 mL premix  Status:  Discontinued     1,000 mg 200 mL/hr over 60 Minutes Intravenous Every 12 hours 01/20/16 0323 01/21/16 0900   01/20/16 0330  ceFEPIme (MAXIPIME) 1  g in dextrose 5 % 50 mL IVPB     1 g 100 mL/hr over 30 Minutes Intravenous  Once 01/20/16 0317 01/20/16 0735       Assessment/Plan PSBO with 2 days of nausea and vomiting on admit 01/20/16 - S/P Robotic anterior and posterior vagotomy, BI anastomosis, DOR fundoplication, omentopexy for partial GOO 09/2015, DR. Viviann SpareSTEVEN GROSS  - Feeding Jejunostomy and gastrostomy/EGD balloon dilatation, secondary to edema at anastomosis, 12/01/15, DR. Karie SodaSteven Gross - S/p diagnostic laparoscopy, omentopexy of jejunal disruption, and washout for jejunal disruption (pt pulled out jejunostomy tube)and gastric leak. 12/03/15, Dr. Karie SodaSteven Gross (pt pulled out jejunostomy tube) - continue daily dulcolax suppository  Aspiration PNA - aspiration w/ start of bolus TF 12/12, patient vomited again through trach 12/18 and CXR shows new small left pleural effusion - zosyn day #7  Severe protein calorie malnutrition - prealbumin slightly up from previous, 13.1  FEN: NPO, cyclic TPN, d/c TF and place G-tube to gravity ID: cefepime 12/6 x1, vancomycin 12/6-12/7, Zosyn 12/13 >> DVT: Heparin/SCD   Plan: CXR shows new small left pleural effusion; spoke with Cindee LamePete with CCM and he is going to come evaluate patient. Continue NPO, cyclic TPN, d/c TF and place G-tube to gravity. Ordering UGI to eval why he cannot tolerate TF. Continue zosyn. Continue to encourage independence and mobilization. CBC in AM.   LOS: 13 days    Edson SnowballBROOKE A MILLER , [facility] 02/02/2016, 9:37 AM Pager: [phone] Consults: [phone] Mon-Fri 7:00 am-4:30 pm Sat-Sun 7:00 am-11:30 am

## 2016-02-02 NOTE — Progress Notes (Signed)
[name].PHARMACY - ADULT TOTAL PARENTERAL NUTRITION CONSULT NOTE   Pharmacy Consult for TPN Indication: PTA TPN continuation, intolerance to enteral feed  Insulin Requirements: 2 units Novolog SSI yesterday  Current Nutrition:  - Clinimix as below  - TFs attempted - stopped PTA TPN from advanced home care: [facility], pt received 405 grams (1376 kcal)  of dextrose, 75 grams (300 kcal) of protein and 50 grams ( 480 kcal) of lipid for a total of 2156 kcal/day  IVF: NS at 80 ml/hr 6a to 6P when TPN off  Central access: PICC TPN start date: PTA, restarted on admission on 12/6   ASSESSMENT                                                                                                          HPI: [age] yoM with hx PUD, CAD, GERD, BPH,  s/p multiple procedures  and extended hospitalization for recurrent partial GOO 2/2 pyloric stricture on TPN for several days, discharged 11/15 with J/G tube in place on TFs, unable to tolerate and had [facility], readmitted 12/6 with partial SBO. Pharmacy consulted to resume TPN.  Significant events:  12/6: TPN continued on admission. [facility] 12/7: Pt pulled out NGT, not replaced, remains NPO 12/8: trial of ice chips 12/10: continue TPN, plan swallow study Monday 12/11, if no aspiration, will start to advance diet.   12/11: Awaiting MBS.  Having bowel function.  Continue TPN for now.   12/12: Severe aspiration risk per MBS yesterday - recommend continuing NPO. Osmolite 1.2 CAL via TF BID ordered 12/11 at 6pm. Per discussion with CCS PA, continue TPN at current goal rate today. F/u for ability to wean tomorrow if tolerates TF. 12/13: Did not tolerate bolus feeds through G-tube; overnight emesis and aspiration event, started on Zosyn. CCS trying trickle feeds today. 12/14 transition to cyclic TPN - appears recovery may be prolonged. Not tolerating trickle feeds; G-tube to drainage.  12/15: TPN was not run at  cyclic rate overnight d/t conflicting information in TPN order. To provide patient full needed nutrition, will continue to run TPN at 83 ml/hr over 24 hr and begin 18-hr cycle with tonight's TPN 12/16: start 12h TPN cycle 12/18: resume trickle tube feed at 5 ml/hr  Today, 02/02/2016:   Glucose - No hx DM noted, CBGs elevated x1 during cycle and post-cycle - SSI q6h  Electrolytes - 12/18 labs - Na remains low but improved, Mg low end of normal  Renal - SCr WNL  I/O = +2L 24h (drain 160ml out)  LFTs - AST/ALT, AlkPhos back down today after rising yesterday; appear to be rising and falling periodically  TGs - 177 ( 11/27), 93 (12/7), 119 (12/11) 148 (12/18)  Prealbumin - 15.3 ( 11/13), 10.8 (12/7), 12.8 (12/11) 13.1 (12/18)  NUTRITIONAL GOALS  RD recs (12/7): Kcal 2000-2200, Protein 100-115g, Fluid 2L/day  [facility] TPN: 2156 Kcal/day, Protein 75g/day  Clinimix E 5/15 at a goal rate of 90 ml/hr + 20% fat emulsion at  10 ml/hr to provide: 108 g/day protein, 2013 Kcal/day. Clinimix E 5/20 2L on cyclic regimen + 120ml of 20% Fat emulsion to provide 100gm protein, 2060 kCal/day  **There is currently a Acupuncturistnational backorder of Clinimix solution **  PLAN                                                                                                                          At 1800 today: Continue 12-hr TPN cycle  Change to Clinimix E 5/, 2000 ml given over 12h cycle as follows (d/w patient's RN):   1800 tonight: 5550ml/hr x 1 hr  1900 tonight: 190 ml/hr x 10h  0500 tomorrow (12/17): reduce rate to 50 ml/hr x 1 hr, then off  20% fat emulsion at 10 ml/hr x 12 hr - to deliver 240 kCal/day  [facility] currently now has 0 Clinimix bags remaining of the E5/15 2L formulation previously used, TPN formula changed to E 5/20  TPN to contain standard multivitamins and trace elements.  CBG coverage per cyclic  rates  TPN lab panels on Mondays & Thursdays,   Continue NS at 80 ml/hr x 12 h (6a - 6p) while TPN off  Bmet, Mag, Phos levels in am  Otho BellowsGreen, Keano Guggenheim L PharmD Pager [phone] 02/02/2016, 10:10 AM

## 2016-02-02 NOTE — Care Management Important Message (Signed)
Important Message  Patient Details  Name: Ardyth GalRalph T Crislip MRN: [id number] Date of Birth: [birth year]   Medicare Important Message Given:  Yes    Caren MacadamFuller, Tiwana Chavis 02/02/2016, 10:29 AMImportant Message  Patient Details  Name: Ardyth GalRalph T Olarte MRN: [id number] Date of Birth: [birth year]   Medicare Important Message Given:  Yes    Caren MacadamFuller, Felina Tello 02/02/2016, 10:29 AM

## 2016-02-02 NOTE — Progress Notes (Signed)
Nutrition Follow-up  DOCUMENTATION CODES:   Severe malnutrition in context of acute illness/injury  INTERVENTION:   - TPN per Pharmacy. - If enteral feeds desired, recommend J-tube placement if medically feasible.  - RD will continue to follow  NUTRITION DIAGNOSIS:   Inadequate oral intake related to inability to eat as evidenced by NPO status.  Ongoing.  GOAL:   Patient will meet greater than or equal to 90% of their needs  Meeting with TPN.  MONITOR:   Weight trends, Labs, Skin, I & O's, Other (Comment) (TPN regimen, ability to re-start TF via G-tube)  ASSESSMENT:   75 year-old male with a medical history of GERD, PUD, and CAD who presented [facility] with a cc of nausea, vomiting, and diarrhea. Patient is s/p Robotic anterior and posterior vagotomy, BI anastomosis, DOR fundoplication, omentopexy 09/2015 for partial GOO secondary to pyloric stricture, followed by placement of feeding jejunostomy and gastrostomy tube and EGD balloon dilation of duodenal stricture 12/01/15 for recurrent GOO secondary to edema at anastomosis and failure to thrive with malnutrition. On 12/03/15 the patient was taken to the OR for a diagnostic laparoscopy, omentopexy of jejunal disruption, and washout for jejunal disruption (pt pulled out jejunostomy tube) and gastric leak.   Vital 1.5 was resumed at 5 ml/hr yesterday and d/c after pt c/o of fullness and eventually began vomiting. If medically feasible, would recommend J-tube. Family scheduled to have meeting with surgery and social work regarding placement options.  Pt continues to receive TPN cycles, formulation switched from Clin E 5/15 to Clin E 5/20 d/t national and now facility shortage.  RD will continue to monitor for plan and GOC.  Medications: Dulcolax suppository daily, KCl IV, IV Zofran PRN Labs reviewed: CBGs: 81-90 Low Na  Plan per Pharmacy 12/19: At 1800 today: Continue 12-hr TPN  cycle  Change to Clinimix E 5/, 2000 ml given over 12h cycle as follows (d/w patient's RN):  ? 1800 tonight: 1950ml/hr x 1 hr ? 1900 tonight: 190 ml/hr x 10h ? 0500 tomorrow (12/17): reduce rate to 50 ml/hr x 1 hr, then off  20% fat emulsion at 10 ml/hr x 12 hr - to deliver 240 kCal/day  [facility] pharmacy currently now has 0 Clinimix bags remaining of the E5/15 2L formulation previously used, TPN formula changed to E 5/20  Diet Order:  Diet NPO time specified Except for: Ice Chips, Other (See Comments) TPN (CLINIMIX-E) Adult  Skin:     Last BM:  12/18  Height:   Ht Readings from Last 1 Encounters:  01/21/16 6\' 2"  (1.88 m)    Weight:   Wt Readings from Last 1 Encounters:  02/02/16 178 lb 9.6 oz (81 kg)    Ideal Body Weight:  86.4 kg  BMI:  Body mass index is 22.93 kg/m.  Estimated Nutritional Needs:   Kcal:  2000-2200  Protein:  100-115g  Fluid:  2L/day  EDUCATION NEEDS:   No education needs identified at this time  Danny FrancoLindsey Pegi Milazzo, MS, RD, LDN Pager: [phone] After Hours Pager: [phone]

## 2016-02-03 ENCOUNTER — Inpatient Hospital Stay (HOSPITAL_COMMUNITY): Payer: Medicare Other

## 2016-02-03 ENCOUNTER — Encounter (HOSPITAL_COMMUNITY): Payer: Self-pay | Admitting: Interventional Radiology

## 2016-02-03 HISTORY — PX: IR GENERIC HISTORICAL: IMG1180011

## 2016-02-03 LAB — GLUCOSE, CAPILLARY
GLUCOSE-CAPILLARY: 106 mg/dL — AB (ref 65–99)
Glucose-Capillary: 174 mg/dL — ABNORMAL HIGH (ref 65–99)
Glucose-Capillary: 171 mg/dL — ABNORMAL HIGH (ref 65–99)
Glucose-Capillary: 63 mg/dL — ABNORMAL LOW (ref 65–99)
Glucose-Capillary: 88 mg/dL (ref 65–99)
Glucose-Capillary: 115 mg/dL — ABNORMAL HIGH (ref 65–99)

## 2016-02-03 LAB — BASIC METABOLIC PANEL
Anion gap: 3 — ABNORMAL LOW (ref 5–15)
BUN: 41 mg/dL — ABNORMAL HIGH (ref 6–20)
CALCIUM: 8.1 mg/dL — AB (ref 8.9–10.3)
CO2: 25 mmol/L (ref 22–32)
CREATININE: 0.83 mg/dL (ref 0.61–1.24)
Chloride: 105 mmol/L (ref 101–111)
GFR calc non Af Amer: >60 mL/min (ref >60)
Glucose, Bld: 148 mg/dL — ABNORMAL HIGH (ref 65–99)
Potassium: 4 mmol/L (ref 3.5–5.1)
SODIUM: 133 mmol/L — AB (ref 135–145)

## 2016-02-03 LAB — PHOSPHORUS: PHOSPHORUS: 3.2 mg/dL (ref 2.5–4.6)

## 2016-02-03 LAB — MAGNESIUM: MAGNESIUM: 1.7 mg/dL (ref 1.7–2.4)

## 2016-02-03 MED ORDER — DEXTROSE 50 % IV SOLN
INTRAVENOUS | Status: AC
  Administered 2016-02-03: 25 mL
  Filled 2016-02-03: qty 50

## 2016-02-03 MED ORDER — FAT EMULSION 20 % IV EMUL
120.0000 mL | INTRAVENOUS | Status: AC
  Administered 2016-02-03: 120 mL via INTRAVENOUS
  Filled 2016-02-03: qty 200

## 2016-02-03 MED ORDER — IOPAMIDOL (ISOVUE-300) INJECTION 61%
INTRAVENOUS | Status: AC
  Filled 2016-02-03: qty 50

## 2016-02-03 MED ORDER — IOPAMIDOL (ISOVUE-300) INJECTION 61%
25.0000 mL | Freq: Once | INTRAVENOUS | Status: AC | PRN
Start: 2016-02-03 — End: 2016-02-03
  Administered 2016-02-03: 25 mL

## 2016-02-03 MED ORDER — TRACE MINERALS CR-CU-MN-SE-ZN 10-1000-500-60 MCG/ML IV SOLN
INTRAVENOUS | Status: AC
  Administered 2016-02-03: 18:00:00 via INTRAVENOUS
  Filled 2016-02-03 (×2): qty 2000

## 2016-02-03 NOTE — Progress Notes (Signed)
Occupational Therapy Treatment Patient Details Name: Ardyth GalRalph T Temkin MRN: [id number] DOB: Mar 18, 1940 Today's Date: 02/03/2016    [facility] of present illness [facility] N/V r/o ileus   OT comments  Pt very participative with BUE exercise in sitting position  Follow Up Recommendations  SNF    Equipment Recommendations  3 in 1 bedside commode    Recommendations for Other Services      Precautions / Restrictions Precautions Precautions: Fall Precaution Comments: trach, gastric tube       Mobility Bed Mobility               General bed mobility comments: OOB in recliner  Transfers                 General transfer comment: NT                        Cognition   Behavior During Therapy: [facility] for tasks assessed/performed Overall Cognitive Status: Within Functional Limits for tasks assessed                         Exercises General Exercises - Upper Extremity Shoulder Flexion: AROM;10 reps;Both;Other (comment);Strengthening (2 sets) Shoulder ABduction: AROM;Right;Left;Seated;Other (comment) (2 sets) Elbow Flexion: AROM;Left;Both;Strengthening;Other (comment) (2 sets) Elbow Extension: AROM;10 reps;Both;Other (comment) (2 sets)   Shoulder Instructions            Pertinent Vitals/ Pain       Pain Assessment: No/denies pain     Prior Functioning/Environment              Frequency  Min 2X/week        Progress Toward Goals  OT Goals(current goals can now be found in the care plan section)        Plan Discharge plan remains appropriate    Co-evaluation                 End of Session     Activity Tolerance Patient tolerated treatment well   Patient Left in chair;with call bell/phone within reach;with family/visitor present   Nurse Communication Mobility status        Time: 7829-56211310-1324 OT Time Calculation (min): 14 min  Charges: OT General Charges $OT Visit: 1 Procedure OT  Treatments $Therapeutic Exercise: 8-22 mins  Neiko Trivedi, Karin GoldenLorraine D 02/03/2016, 2:09 PM

## 2016-02-03 NOTE — Progress Notes (Signed)
Physical Therapy Treatment Patient Details Name: Danny GalRalph T Gerken MRN: [id number] DOB: Feb 26, 1940 Today's Date: 02/03/2016    [facility] of Present [facility] r/o ileus    PT Comments    Pt sitting in recliner looking better each day.  Assisted with amb a greater distance.  Daughter present and assisted.    Follow Up Recommendations  SNF     Equipment Recommendations  None recommended by PT    Recommendations for Other Services       Precautions / Restrictions Precautions Precautions: None Precaution Comments: trach, gastric tube Restrictions Weight Bearing Restrictions: No    Mobility  Bed Mobility               General bed mobility comments: OOB in recliner  Transfers Overall transfer level: Needs assistance Equipment used: Rolling walker (2 wheeled) Transfers: Sit to/from Stand Sit to Stand: Mod assist;+2 safety/equipment Stand pivot transfers: Min assist       General transfer comment: 25% VC's on proper hand placement to increase self performance  Ambulation/Gait Ambulation/Gait assistance: Min assist;+2 safety/equipment Ambulation Distance (Feet): 325 Feet Assistive device: Rolling walker (2 wheeled) Gait Pattern/deviations: Step-to pattern;Step-through pattern Gait velocity: decreased   General Gait Details: No longer using EVA walker.  Had pt amb with RW + 2 assist for safety such that recliner follows with pt as he sits quickly with fatigue. AVF RA 98% and HR 87.  Tolerating increased distance each session.     Stairs            Wheelchair Mobility    Modified Rankin (Stroke Patients Only)       Balance                                    Cognition Arousal/Alertness: Awake/alert Behavior During Therapy: WFL for tasks assessed/performed Overall Cognitive Status: Within Functional Limits for tasks assessed                 General Comments: some times he gets lost    quiet     Exercises General Exercises - Upper Extremity Shoulder Flexion: AROM;10 reps;Both;Other (comment);Strengthening (2 sets) Shoulder ABduction: AROM;Right;Left;Seated;Other (comment) (2 sets) Elbow Flexion: AROM;Left;Both;Strengthening;Other (comment) (2 sets) Elbow Extension: AROM;10 reps;Both;Other (comment) (2 sets)    General Comments        Pertinent Vitals/Pain Pain Assessment: No/denies pain    Home Living                      Prior Function            PT Goals (current goals can now be found in the care plan section) Progress towards PT goals: Progressing toward goals    Frequency    Min 3X/week      PT Plan Current plan remains appropriate    Co-evaluation             End of Session Equipment Utilized During Treatment: Gait belt Activity Tolerance: Patient tolerated treatment well Patient left: in chair;with call bell/phone within reach;with family/visitor present     Time: 1446-1520 PT Time Calculation (min) (ACUTE ONLY): 34 min  Charges:  $Gait Training: 8-22 mins $Therapeutic Activity: 8-22 mins                    G Codes:      Felecia ShellingLori Jshawn Hurta  PTA [facility]  Acute  Rehab Pager      [phone]

## 2016-02-03 NOTE — Progress Notes (Signed)
Hypoglycemic Event  CBG: 63  Treatment: D50 IV 25 mL  Symptoms: none  Follow-up CBG: Time:0805 CBG Result:88  Possible Reasons for Event: Other: tna stopped  Comments/MD notified:brooke miller Follow protocol  Paulette Blanchorbett, Sunnie Odden U

## 2016-02-03 NOTE — Progress Notes (Signed)
SLP Cancellation Note  Patient Details Name: Ardyth GalRalph T Hally MRN: [id number] DOB: 1940-10-07   Cancelled treatment:       Reason Eval/Treat Not Completed:  (pt with RN, requesting pain medicine and to be moved per NT, will contiue efforts)   Donavan Burnetamara Vikas Wegmann, MS [facility] SLP [phone]

## 2016-02-03 NOTE — Procedures (Signed)
Unsuccessful attempt at converting to GJ tube  Distal stomach stricture could be crossed with wire and 5 fr cath but unable to pass j tube.  Needs a second attempt after overnight LWS with sedation. Plan for tomorrow  5030fr gtube replaced into the stomach

## 2016-02-03 NOTE — Progress Notes (Signed)
Patient ID: [name], male   DOB: 14-Jul-1940, 75 y.o.   MRN: [id number]  [facility]     Subjective: Resting comfortable in bed. Daughter at bedside.  Denies any current abdominal pain, nausea, or vomiting.  Objective: Vital signs in last 24 hours: Temp:  [97.7 F (36.5 C)-98.9 F (37.2 C)] 98.9 F (37.2 C) (12/20 0530) Pulse Rate:  [87-96] 91 (12/20 0540) Resp:  [15-18] 15 (12/20 0530) BP: (110-118)/(37-54) 118/54 (12/20 0540) SpO2:  [90 %-96 %] 96 % (12/20 0540) FiO2 (%):  [28 %] 28 % (12/20 0540) Weight:  [178 lb 9.6 oz (81 kg)] 178 lb 9.6 oz (81 kg) (12/19 0915) Last BM Date: 02/01/16  Intake/Output from previous day: 12/19 0701 - 12/20 0700 In: 2008.7 [I.V.:1758.7; IV Piggyback:250] Out: 3025 [Urine:1025; Drains:2000] Intake/Output this shift: No intake/output data recorded.  PE: Gen: Alert, NAD, pleasant and cooperative HEENT: trach Pulm: effort normal, no cough Abd: Soft, NT/ND, G tube site c/d/i, laparoscopic incision sites healing well  Lab Results:   Recent Labs  02/01/16 0406  WBC 8.8  HGB 8.2*  HCT 24.7*  PLT 409*   BMET  Recent Labs  02/01/16 0406 02/03/16 0422  NA 132* 133*  K 3.7 4.0  CL 104 105  CO2 23 25  GLUCOSE 126* 148*  BUN 29* 41*  CREATININE 0.77 0.83  CALCIUM 8.0* 8.1*   PT/INR No results for input(s): LABPROT, INR in the last 72 hours. CMP     Component Value Date/Time   NA 133 (L) 02/03/2016 0422   NA 139 01/14/2016   K 4.0 02/03/2016 0422   CL 105 02/03/2016 0422   CO2 25 02/03/2016 0422   GLUCOSE 148 (H) 02/03/2016 0422   BUN 41 (H) 02/03/2016 0422   BUN 23 (A) 01/14/2016   CREATININE 0.83 02/03/2016 0422   CALCIUM 8.1 (L) 02/03/2016 0422   PROT 5.4 (L) 02/01/2016 0406   ALBUMIN 1.5 (L) 02/01/2016 0406   AST 36 02/01/2016 0406   ALT 43 02/01/2016 0406   ALKPHOS 168 (H) 02/01/2016 0406   BILITOT 0.6 02/01/2016 0406   GFRNONAA >60 02/03/2016 0422   GFRAA >60 02/03/2016 0422    Lipase     Component Value Date/Time   LIPASE 55 (H) 01/20/2016 0158       Studies/Results: Dg Chest Port 1 View  Result Date: 02/02/2016 CLINICAL DATA:  Possible aspiration, history of vomiting EXAM: PORTABLE CHEST 1 VIEW COMPARISON:  02/01/2016 FINDINGS: PICC line is again noted on the right. Tracheostomy tube is again seen and stable. Cardiac shadow is within normal limits. The bases again demonstrate mild atelectatic changes similar to that seen on the prior exam. There is likely a small pleural effusion present as well on the left. No bony abnormality is noted. IMPRESSION: Stable bibasilar atelectasis. New small left pleural effusion. Electronically Signed   By: Alcide CleverMark  Lukens M.D.   On: 02/02/2016 09:58   Dg Abd Acute W/chest  Result Date: 02/01/2016 CLINICAL DATA:  Pneumonia. Ileus following gastrointestinal surgery. EXAM: DG ABDOMEN ACUTE W/ 1V CHEST COMPARISON:  01/28/2016 FINDINGS: Tracheostomy in good position.  Right arm PICC in the mid SVC. Left lower lobe airspace disease and small left effusion unchanged from 01/26/2016. Possible pneumonia. Mild right lower lobe airspace disease also unchanged. Negative for heart failure. Gastrostomy tube overlies the stomach. Surgical clips along the gastric antrum and in the gallbladder fossa. Interval improvement in gaseous distention of stomach and large bowel. Air-fluid levels in  large and small bowel on the decubitus view. No free air. IMPRESSION: Bibasilar airspace disease and small left effusion unchanged. Possible pneumonia. Improvement in bowel dilatation since prior study. Air-fluid levels remain in large and small bowel loops on the decubitus view suggesting gastroenteritis or ileus. No free air. Electronically Signed   By: Marlan Palauharles  Clark M.D.   On: 02/01/2016 09:39   Dg Kayleen MemosUgi W/small Bowel  Result Date: 02/03/2016 CLINICAL DATA:  75 year old male post Billroth 1 procedure. Postoperative jejunostomy and gastrostomy. Small bowel  obstruction. Subsequent encounter. EXAM: UPPER GI SERIES WITH SMALL BOWEL FOLLOW-THROUGH FLUOROSCOPY TIME:  Fluoroscopy Time:  1 minutes and 43 second Radiation Exposure Index (if provided by the fluoroscopic device): 7.90 mGy Number of Acquired Spot Images: 3 TECHNIQUE: 200 cc of barium was instilled via gastrostomy tube. Gastrostomy tube was then flush with water. COMPARISON:  02/01/2016 plain film examination.  01/20/2016 CT. FINDINGS: Procedure was discussed with Romilda JoyBroke Miller PA [facility] [place] [facility]. It was elected to perform barium examination and small bowel follow-through. Preliminary images reviewed with Dr. Cleda Dauboh. This had to be performed in a delayed fashion as patient was not able to cooperate in Radiology suite and delayed imaging obtained as plain film of the abdomen. Scout view reveals scoliosis lumbar spine convex left. Residual barium right colon. Gastrostomy tube in place. Gas-filled dilated small bowel. Gastrostomy tube gastric body level. Post antrectomy. At the antrectomy small bowel anastomosis there is prominent stricture. Contrast traverses beyond this region. Limited evaluation of the stomach secondary to single contrast technique and patient's inability to move. Dilated duodenum and jejunum with delayed filling of dilated small bowel loops. Findings raise possibility of jejunal narrowing possibly related to adhesions. IMPRESSION: Post antrectomy with prominent stricture at the antrectomy small bowel anastomosis. Beyond the stricture, there is dilated duodenum and jejunum possibly related to adhesions. Prominent small bowel obstruction with significantly delayed filling of dilated small bowel loops. Gastrostomy tube is in place. Limited evaluation of stomach secondary to technique which had to be utilized (single contrast technique with patient not able to move from supine position). Electronically Signed   By: Lacy DuverneySteven  Olson M.D.   On: 02/03/2016 07:42     Anti-infectives: Anti-infectives    Start     Dose/Rate Route Frequency Ordered Stop   01/26/16 2300  piperacillin-tazobactam (ZOSYN) IVPB 3.375 g     3.375 g 12.5 mL/hr over 240 Minutes Intravenous Every 8 hours 01/26/16 2256     01/26/16 2230  vancomycin (VANCOCIN) IVPB 1000 mg/200 mL premix  Status:  Discontinued     1,000 mg 200 mL/hr over 60 Minutes Intravenous Every 12 hours 01/26/16 2227 01/26/16 2227   01/20/16 0400  vancomycin (VANCOCIN) IVPB 1000 mg/200 mL premix  Status:  Discontinued     1,000 mg 200 mL/hr over 60 Minutes Intravenous Every 12 hours 01/20/16 0323 01/21/16 0900   01/20/16 0330  ceFEPIme (MAXIPIME) 1 g in dextrose 5 % 50 mL IVPB     1 g 100 mL/hr over 30 Minutes Intravenous  Once 01/20/16 0317 01/20/16 0735       Assessment/Plan PSBO with 2 days of nausea and vomiting on admit 01/20/16 - S/P Robotic anterior and posterior vagotomy, BI anastomosis, DOR fundoplication, omentopexy for partial GOO 09/2015, DR. Viviann SpareSTEVEN GROSS  - Feeding Jejunostomy and gastrostomy/EGD balloon dilatation, secondary to edema at anastomosis, 12/01/15, DR. Karie SodaSteven Gross - S/p diagnostic laparoscopy, omentopexy of jejunal disruption, and washout for jejunal disruption (pt pulled out jejunostomy tube)and gastric leak. 12/03/15, Dr. Viviann SpareSteven  Gross (pt pulled out jejunostomy tube)  Aspiration PNA - aspiration w/ start of bolus TF 12/12 and 12/18  - CXR 12/19 showed new small left pleural effusion. Plan to repeat CXR tomorrow. - zosyn day #8  Severe protein calorie malnutrition - prealbumin slightly up from previous, 13.1  FEN: NPO, cyclic TPN, G-tube to gravity with flushing q2 hours today ID: cefepime 12/6 x1, vancomycin 12/6-12/7, Zosyn 12/13 >> DVT: Heparin/SCD   Plan: Flush G-tube q2 hours today to remove all barium, leave to gravity after each flushing. Continue NPO, cyclic TPN. Awaiting results of delayed films from UGI with small bowel follow through.  Repeat CXR  tomorrow to eval left pleural effusion. Continue zosyn.   LOS: 14 days    Edson Snowball , [facility] 02/03/2016, 8:26 AM Pager: [phone] Consults: [phone] Mon-Fri 7:00 am-4:30 pm Sat-Sun 7:00 am-11:30 am

## 2016-02-03 NOTE — Progress Notes (Signed)
[name].PHARMACY - ADULT TOTAL PARENTERAL NUTRITION CONSULT NOTE   Pharmacy Consult for TPN Indication: PTA TPN continuation, intolerance to enteral feed  Insulin Requirements: 4 units Novolog SSI yesterday  Current Nutrition:  - Clinimix as below  - TFs attempted - stopped PTA TPN from advanced home care: [facility] 405 grams (1376 kcal)  of dextrose, 75 grams (300 kcal) of protein and 50 grams ( 480 kcal) of lipid for a total of 2156 kcal/day  IVF: NS at 80 ml/hr 6a to 6P when TPN off  Central access: PICC TPN start date: PTA, restarted on admission on 12/6   ASSESSMENT                                                                                                          HPI: [age] yoM with hx PUD, CAD, GERD, BPH,  s/p multiple procedures  and extended hospitalization for recurrent partial GOO 2/2 pyloric stricture on TPN for several days, discharged 11/15 with J/G tube in place on TFs, unable to tolerate and had [facility], readmitted 12/6 with partial SBO. Pharmacy consulted to resume TPN.  Significant events:  12/6: TPN continued on admission. [facility]: Pt pulled out NGT, not replaced, remains NPO 12/8: trial of ice chips 12/10: continue TPN, plan swallow study Monday 12/11, if no aspiration, will start to advance diet.   12/11: Awaiting MBS.  Having bowel function.  Continue TPN for now.   12/12: Severe aspiration risk per MBS yesterday - recommend continuing NPO. Osmolite 1.2 CAL via TF BID ordered 12/11 at 6pm. Per discussion with [facility] PA, continue TPN at current goal rate today. F/u for ability to wean tomorrow if tolerates TF. 12/13: Did not tolerate bolus feeds through G-tube; overnight emesis and aspiration event, started on Zosyn. CCS trying trickle feeds today. 12/14 transition to cyclic TPN - appears recovery may be prolonged. Not tolerating trickle feeds; G-tube to drainage.  12/15: TPN was not run at  cyclic rate overnight d/t conflicting information in TPN order. To provide patient full needed nutrition, will continue to run TPN at 83 ml/hr over 24 hr and begin 18-hr cycle with tonight's TPN 12/16: start 12h TPN cycle 12/18: resume trickle tube feed at 5 ml/hr 12/19: Nausea/vomiting, tube feed held. TPN formula to E 5/20 (more dextrose than E 5/15 formulation previously used)  Today, 02/03/2016:   Glucose - No hx DM noted, CBGs wnl during cycle and low x1 post-cycle - SSI q6h  Electrolytes - 12/20 labs - Na remains low but improved, Mg low end of normal  Renal - SCr WNL  I/O = - 6994ml/ 24h (drain CHARTED 2L out-highly doubt value)  LFTs - AST/ALT, AlkPhos back down today after rising yesterday; appear to be rising and falling periodically  TGs - 177 ( 11/27), 93 (12/7), 119 (12/11) 148 (12/18)  Prealbumin - 15.3 ( 11/13), 10.8 (12/7), 12.8 (12/11) 13.1 (12/18)  NUTRITIONAL GOALS  RD recs (12/7): Kcal 2000-2200, Protein 100-115g, Fluid 2L/day  [facility] T[facility]: 2156 Kcal/day, Protein 75g/day  Clinimix E 5/15 at a goal rate of 90 ml/hr + 20% fat emulsion at  10 ml/hr to provide: 108 g/day protein, 2013 Kcal/day. Clinimix E 5/20 2L on cyclic regimen + 120ml of 20% Fat emulsion to provide 100gm protein, 2060 kCal/day  **There is currently a Acupuncturistnational backorder of Clinimix solution **  PLAN                                                                                                                          At 1800 today: Continue 12-hr TPN cycle  Continue with Clinimix E 5/, 2000 ml given over 12h cycle as follows (d/w patient's RN):   1800 tonight: 5050ml/hr x 1 hr  1900 tonight: 190 ml/hr x 10h  0500 tomorrow (12/17): reduce rate to 50 ml/hr x 1 hr, then off  20% fat emulsion at 10 ml/hr x 12 hr - to deliver 240 kCal/day  [facility] pharmacy currently now has 0 Clinimix bags remaining of the E5/15  2L formulation previously used, TPN formula changed to E 5/20  TPN to contain standard multivitamins and trace elements.  CBG coverage per cyclic rates  TPN lab panels on Mondays & Thursdays,   Continue NS at 80 ml/hr x 12 h (6a - 6p) while TPN off  Otho BellowsGreen, Jeanann Balinski L PharmD Pager [phone] 02/03/2016, 11:13 AM

## 2016-02-04 ENCOUNTER — Inpatient Hospital Stay (HOSPITAL_COMMUNITY): Payer: Medicare Other

## 2016-02-04 ENCOUNTER — Encounter (HOSPITAL_COMMUNITY): Payer: Self-pay | Admitting: Interventional Radiology

## 2016-02-04 HISTORY — PX: IR GENERIC HISTORICAL: IMG1180011

## 2016-02-04 LAB — COMPREHENSIVE METABOLIC PANEL
ALK PHOS: 144 U/L — AB (ref 38–126)
ALT: 41 U/L (ref 17–63)
AST: 25 U/L (ref 15–41)
Albumin: 1.5 g/dL — ABNORMAL LOW (ref 3.5–5.0)
Anion gap: 4 — ABNORMAL LOW (ref 5–15)
BUN: 37 mg/dL — AB (ref 6–20)
CALCIUM: 8.1 mg/dL — AB (ref 8.9–10.3)
CO2: 24 mmol/L (ref 22–32)
CREATININE: 0.78 mg/dL (ref 0.61–1.24)
Chloride: 106 mmol/L (ref 101–111)
Glucose, Bld: 163 mg/dL — ABNORMAL HIGH (ref 65–99)
Potassium: 4 mmol/L (ref 3.5–5.1)
SODIUM: 134 mmol/L — AB (ref 135–145)
Total Bilirubin: 0.3 mg/dL (ref 0.3–1.2)
Total Protein: 5.5 g/dL — ABNORMAL LOW (ref 6.5–8.1)

## 2016-02-04 LAB — GLUCOSE, CAPILLARY
GLUCOSE-CAPILLARY: 107 mg/dL — AB (ref 65–99)
Glucose-Capillary: 101 mg/dL — ABNORMAL HIGH (ref 65–99)
Glucose-Capillary: 164 mg/dL — ABNORMAL HIGH (ref 65–99)
Glucose-Capillary: 171 mg/dL — ABNORMAL HIGH (ref 65–99)
Glucose-Capillary: 82 mg/dL (ref 65–99)

## 2016-02-04 LAB — CBC
HEMATOCRIT: 23 % — AB (ref 39.0–52.0)
Hemoglobin: 7.6 g/dL — ABNORMAL LOW (ref 13.0–17.0)
MCH: 30.2 pg (ref 26.0–34.0)
MCHC: 33 g/dL (ref 30.0–36.0)
MCV: 91.3 fL (ref 78.0–100.0)
Platelets: 440 10*3/uL — ABNORMAL HIGH (ref 150–400)
RBC: 2.52 MIL/uL — ABNORMAL LOW (ref 4.22–5.81)
RDW: 18 % — AB (ref 11.5–15.5)
WBC: 8.2 10*3/uL (ref 4.0–10.5)

## 2016-02-04 LAB — PHOSPHORUS: PHOSPHORUS: 3.4 mg/dL (ref 2.5–4.6)

## 2016-02-04 LAB — MAGNESIUM: Magnesium: 1.7 mg/dL (ref 1.7–2.4)

## 2016-02-04 MED ORDER — TRACE MINERALS CR-CU-MN-SE-ZN 10-1000-500-60 MCG/ML IV SOLN
INTRAVENOUS | Status: AC
  Administered 2016-02-04: 19:00:00 via INTRAVENOUS
  Filled 2016-02-04 (×2): qty 2000

## 2016-02-04 MED ORDER — MIDAZOLAM HCL 2 MG/2ML IJ SOLN
INTRAMUSCULAR | Status: AC | PRN
  Administered 2016-02-04: 1 mg via INTRAVENOUS

## 2016-02-04 MED ORDER — FENTANYL CITRATE (PF) 100 MCG/2ML IJ SOLN
INTRAMUSCULAR | Status: AC | PRN
  Administered 2016-02-04: 25 ug via INTRAVENOUS

## 2016-02-04 MED ORDER — IOPAMIDOL (ISOVUE-300) INJECTION 61%
25.0000 mL | Freq: Once | INTRAVENOUS | Status: AC | PRN
  Administered 2016-02-04: 25 mL

## 2016-02-04 MED ORDER — IOPAMIDOL (ISOVUE-300) INJECTION 61%
INTRAVENOUS | Status: AC
  Filled 2016-02-04: qty 50

## 2016-02-04 MED ORDER — HEPARIN SODIUM (PORCINE) 5000 UNIT/ML IJ SOLN
5000.0000 [IU] | Freq: Three times a day (TID) | INTRAMUSCULAR | Status: DC
  Administered 2016-02-05 – 2016-02-15 (×28): 5000 [IU] via SUBCUTANEOUS
  Filled 2016-02-04 (×28): qty 1

## 2016-02-04 MED ORDER — FENTANYL CITRATE (PF) 100 MCG/2ML IJ SOLN
INTRAMUSCULAR | Status: AC
  Filled 2016-02-04: qty 4

## 2016-02-04 MED ORDER — FAT EMULSION 20 % IV EMUL
120.0000 mL | INTRAVENOUS | Status: AC
  Administered 2016-02-04: 120 mL via INTRAVENOUS
  Filled 2016-02-04: qty 120

## 2016-02-04 MED ORDER — MIDAZOLAM HCL 2 MG/2ML IJ SOLN
INTRAMUSCULAR | Status: AC
  Filled 2016-02-04: qty 6

## 2016-02-04 NOTE — Progress Notes (Signed)
OT Cancellation Note  Patient Details Name: [name] MRN: [id number] DOB: 07/17/1940   Cancelled Treatment:    Reason Eval/Treat Not Completed: Other (comment)  Pt just returned from procedure and still very sleepy- will recheck on pt later in day or next day. Pts wife did bring weights as requested by OT Lise AuerLori Amber Williard, OT [phone]  Einar CrowEDDING, [name] 02/04/2016, 11:33 AM

## 2016-02-04 NOTE — Progress Notes (Signed)
Physical Therapy Note  Patient Details Name: Ardyth GalRalph T Kallenbach MRN: [id number] DOB: 1940-09-21   Pt's progress discussed with PTA.  Pt's goals updated as well as duration.  Lynnix Schoneman,KATHrine E 02/04/2016, 3:36 PM Zenovia JarredKati Earlean Fidalgo, PT, DPT 02/04/2016 Pager: [phone]

## 2016-02-04 NOTE — Progress Notes (Signed)
[facility] Progress Note     Subjective: Laying in bed in no acute distress.c/o mild abdominal pain and unable to report last BM. denies nausea or vomiting.  Radiology tech in room to take patient downstairs for repeat attempt at GJ conversion.  Objective: Vital signs in last 24 hours: Temp:  [98 F (36.7 C)-98.6 F (37 C)] 98 F (36.7 C) (12/21 0544) Pulse Rate:  [80-91] 86 (12/21 0544) Resp:  [16] 16 (12/21 0544) BP: (110-115)/(54-55) 115/54 (12/21 0544) SpO2:  [92 %-96 %] 95 % (12/21 0840) FiO2 (%):  [28 %] 28 % (12/21 0544) Weight:  [82.1 kg (181 lb)] 82.1 kg (181 lb) (12/21 0546) Last BM Date: 02/01/16  Intake/Output from previous day: 12/20 0701 - 12/21 0700 In: 1760 [I.V.:1610; IV Piggyback:150] Out: 1625 [Urine:975; Drains:650] Intake/Output this shift: No intake/output data recorded.  PE: Gen:  Alert, NAD, pleasant HEENT: trach in place on trach collar, some gurgling Pulm: unlabored Abd: Soft, mild tenderness of upper abdomen without peritonitis or guarding   Lab Results:   Recent Labs  02/04/16 0438  WBC 8.2  HGB 7.6*  HCT 23.0*  PLT 440*   BMET  Recent Labs  02/03/16 0422 02/04/16 0438  NA 133* 134*  K 4.0 4.0  CL 105 106  CO2 25 24  GLUCOSE 148* 163*  BUN 41* 37*  CREATININE 0.83 0.78  CALCIUM 8.1* 8.1*   PT/INR No results for input(s): LABPROT, INR in the last 72 hours. CMP     Component Value Date/Time   NA 134 (L) 02/04/2016 0438   NA 139 01/14/2016   K 4.0 02/04/2016 0438   CL 106 02/04/2016 0438   CO2 24 02/04/2016 0438   GLUCOSE 163 (H) 02/04/2016 0438   BUN 37 (H) 02/04/2016 0438   BUN 23 (A) 01/14/2016   CREATININE 0.78 02/04/2016 0438   CALCIUM 8.1 (L) 02/04/2016 0438   PROT 5.5 (L) 02/04/2016 0438   ALBUMIN 1.5 (L) 02/04/2016 0438   AST 25 02/04/2016 0438   ALT 41 02/04/2016 0438   ALKPHOS 144 (H) 02/04/2016 0438   BILITOT 0.3 02/04/2016 0438   GFRNONAA >60 02/04/2016 0438   GFRAA >60 02/04/2016 0438    Lipase     Component Value Date/Time   LIPASE 55 (H) 01/20/2016 0158       Studies/Results: Ir Replc Gastro/colonic Tube Percut W/fluoro  Result Date: 02/03/2016 INDICATION: Status post antrectomy with a severe stricture at the antrectomy small bowel anastomosis. Associated gastric obstruction and partial small bowel obstruction. EXAM: FLUOROSCOPIC ATTEMPT AT CONVERTING THE 30 FRENCH G-TUBE TO A GJ TUBE. MEDICATIONS: NONE ANESTHESIA/SEDATION: NONE. CONTRAST:  10 ISOVUE-300 IOPAMIDOL (ISOVUE-300) INJECTION 61% - administered into the gastric lumen. FLUOROSCOPY TIME:  Fluoroscopy Time: 10 minutes 18 seconds (100 mGy). COMPLICATIONS: None immediate. PROCEDURE: Informed written consent was obtained from the THE PATIENT AND HIS FAMILY after a thorough discussion of the procedural risks, benefits and alternatives. All questions were addressed. Maximal Sterile Barrier Technique was utilized including caps, mask, sterile gowns, sterile gloves, sterile drape, hand hygiene and skin antiseptic. A timeout was performed prior to the initiation of the procedure. Under fluoroscopy, the existing 30 JamaicaFrench G-tube was removed over a guidewire. A C2 catheter and Glidewire were utilized to manipulate the access through the distal antrectomy small bowel anastomosis. Guidewire was able to be advanced into the proximal jejunum. Several attempts were made to pass the 30 JamaicaFrench GJ tube however the J portion of the feeding tube would not pass  through this stricture. Guidewire was exchanged for an Amplatz guidewire. Attempts were made to serially dilate the stricture however the patient would not tolerate this secondary to abdominal pain. Therefore, the procedure was stopped. Over the guidewire, a 30 [place] G-tube was re- advanced into the stomach. Retention balloon inflated with 10 cc saline and retracted against anterior gastric wall. Position confirmed with contrast injection and secured externally. Patient tolerated the  procedure well. IMPRESSION: Unsuccessful attempt at conversion to a GJ tube secondary to a distal stomach severe stricture which could not be traversed with the J portion of the feeding tube. PLAN: Gastrostomy to low wall suction overnight to decompress the stomach. Re- attempt at traversing distal stomach stricture tomorrow with conscious sedation. Electronically Signed   By: Judie Petit.  Shick M.D.   On: 02/03/2016 17:20   Dg Chest Port 1 View  Result Date: 02/04/2016 CLINICAL DATA:  Initial evaluation for pleural effusion. EXAM: PORTABLE CHEST 1 VIEW COMPARISON:  Prior radiograph from 02/02/2016. FINDINGS: Tracheostomy tube in appropriate position overlying the upper airway. Cardiac and mediastinal silhouettes are stable, and remain within normal limits. Right PICC catheter in place with tip overlying the distal SVC, stable. Lungs are normally inflated. No pulmonary edema. There is a suspected persistent small left pleural effusion. Left basilar atelectasis is similar. There is increased opacity at the right lung base, which may reflect progressive atelectasis or possibly infiltrate. No other new focal airspace disease. No pneumothorax. Osseous structures unchanged. IMPRESSION: 1. Probable persistent small left pleural effusion with similar left basilar atelectasis. 2. Increased opacity at the right lung base, which may reflect progressive atelectasis or infiltrate. Electronically Signed   By: Rise Mu M.D.   On: 02/04/2016 06:01   Dg Chest Port 1 View  Result Date: 02/02/2016 CLINICAL DATA:  Possible aspiration, history of vomiting EXAM: PORTABLE CHEST 1 VIEW COMPARISON:  02/01/2016 FINDINGS: PICC line is again noted on the right. Tracheostomy tube is again seen and stable. Cardiac shadow is within normal limits. The bases again demonstrate mild atelectatic changes similar to that seen on the prior exam. There is likely a small pleural effusion present as well on the left. No bony abnormality is  noted. IMPRESSION: Stable bibasilar atelectasis. New small left pleural effusion. Electronically Signed   By: Alcide Clever M.D.   On: 02/02/2016 09:58   Dg Kayleen Memos W/small Bowel  Result Date: 02/03/2016 CLINICAL DATA:  75 year old male post Billroth 1 procedure. Postoperative jejunostomy and gastrostomy. Small bowel obstruction. Subsequent encounter. EXAM: UPPER GI SERIES WITH SMALL BOWEL FOLLOW-THROUGH FLUOROSCOPY TIME:  Fluoroscopy Time:  1 minutes and 43 second Radiation Exposure Index (if provided by the fluoroscopic device): 7.90 mGy Number of Acquired Spot Images: 3 TECHNIQUE: 200 cc of barium was instilled via gastrostomy tube. Gastrostomy tube was then flush with water. COMPARISON:  02/01/2016 plain film examination.  01/20/2016 CT. FINDINGS: Procedure was discussed with Romilda Joy PA [facility]. It was elected to perform barium examination and small bowel follow-through. Preliminary images reviewed with Dr. Cleda Daub. This had to be performed in a delayed fashion as patient was not able to cooperate in Radiology suite and delayed imaging obtained as plain film of the abdomen. Scout view reveals scoliosis lumbar spine convex left. Residual barium right colon. Gastrostomy tube in place. Gas-filled dilated small bowel. Gastrostomy tube gastric body level. Post antrectomy. At the antrectomy small bowel anastomosis there is prominent stricture. Contrast traverses beyond this region. Limited evaluation of the stomach secondary to single contrast technique and patient's  inability to move. Dilated duodenum and jejunum with delayed filling of dilated small bowel loops. Findings raise possibility of jejunal narrowing possibly related to adhesions. IMPRESSION: Post antrectomy with prominent stricture at the antrectomy small bowel anastomosis. Beyond the stricture, there is dilated duodenum and jejunum possibly related to adhesions. Prominent small bowel obstruction with significantly delayed filling of  dilated small bowel loops. Gastrostomy tube is in place. Limited evaluation of stomach secondary to technique which had to be utilized (single contrast technique with patient not able to move from supine position). Electronically Signed   By: Lacy DuverneySteven  Olson M.D.   On: 02/03/2016 07:42    Anti-infectives: Anti-infectives    Start     Dose/Rate Route Frequency Ordered Stop   01/26/16 2300  piperacillin-tazobactam (ZOSYN) IVPB 3.375 g     3.375 g 12.5 mL/hr over 240 Minutes Intravenous Every 8 hours 01/26/16 2256     01/26/16 2230  vancomycin (VANCOCIN) IVPB 1000 mg/200 mL premix  Status:  Discontinued     1,000 mg 200 mL/hr over 60 Minutes Intravenous Every 12 hours 01/26/16 2227 01/26/16 2227   01/20/16 0400  vancomycin (VANCOCIN) IVPB 1000 mg/200 mL premix  Status:  Discontinued     1,000 mg 200 mL/hr over 60 Minutes Intravenous Every 12 hours 01/20/16 0323 01/21/16 0900   01/20/16 0330  ceFEPIme (MAXIPIME) 1 g in dextrose 5 % 50 mL IVPB     1 g 100 mL/hr over 30 Minutes Intravenous  Once 01/20/16 0317 01/20/16 0735       Assessment/Plan PSBO with 2 days of nausea and vomiting on admit 01/20/16 - S/P Robotic anterior and posterior vagotomy, BI anastomosis, DOR fundoplication, omentopexy for partial GOO 09/2015, DR. Viviann SpareSTEVEN GROSS  - Feeding Jejunostomy and gastrostomy/EGD balloon dilatation, secondary to edema at anastomosis, 12/01/15, DR. Karie SodaSteven Gross - S/p diagnostic laparoscopy, omentopexy of jejunal disruption, and washout for jejunal disruption (pt pulled out jejunostomy tube)and gastric leak. 12/03/15, Dr. Karie SodaSteven Gross (pt pulled out jejunostomy tube) - resolved  Aspiration PNA - aspiration w/ start of bolus TF 12/12 and 12/18  - CXR 12/19 showed new small left pleural effusion.   Severe protein calorie malnutrition - prealbumin slightly up from previous, 13.1  - 12/20: IR attempt to convert GJ tube due to distal stomach stricture; plan to re-attempt today 12/21  FEN: NPO,  cyclic TPN, G-tube to gravity with flushing q2 hours today ID: cefepime 12/6 x1, vancomycin 12/6-12/7, Zosyn 12/13-12/21; d/c zosyn today as it has been day#9.  DVT: Heparin/SCD   Plan: repeat attempt at conversion of GJ tube today continue NPO, cyclic TPN. D/c Zosyn, CBC in AM    LOS: 15 days    Adam PhenixElizabeth S Chele Cornell , [facility] 02/04/2016, 8:58 AM Pager: [phone] Consults: [phone] Mon-Fri 7:00 am-4:30 pm Sat-Sun 7:00 am-11:30 am

## 2016-02-04 NOTE — Clinical Social Work Note (Signed)
Per patient's wife, patient is on waiting list for [facility]. Wife requested that MSW contact [facility] in regards to managing patient's care.   [facility] facilities does NOT manage trach care. MSW to update family with this information.   MSW remains available as needed.   Derenda FennelBashira Sloane Palmer, MSW [phone] 02/04/2016 3:17 PM

## 2016-02-04 NOTE — Progress Notes (Signed)
SLP Cancellation Note  Patient Details Name: [name] MRN: [id number] DOB: 1940/08/12   Cancelled treatment:       Reason Eval/Treat Not Completed: Patient at procedure or test/unavailable - or J tube today.   [name], Danny Kaltenbach Ann  Jaken Fregia [name], [name] [name] [phone]

## 2016-02-04 NOTE — Procedures (Signed)
Interventional Radiology Procedure Note  Procedure: Successful conversion of gastric to gastrojejunal tube.  The tip of the J catheter is in the proximal jejunum.   Complications: None  Estimated Blood Loss: 0   Recommendations: - J arm ready for use, may begin feeds.  DO NOT put meds through J arm - Continue to use G tube portion for decompression as needed.   Signed,  Sterling BigHeath K. Schae Cando, MD

## 2016-02-04 NOTE — Progress Notes (Signed)
Physical Therapy Treatment Patient Details Name: [name] MRN: [id number] DOB: 1940-10-25 Today's Date: 02/04/2016    History of Present Illness      PT Comments    Pt OOB in recliner.  Assisted with amb greater distance.  3 walks for a total of 350 feet on RA avg 94 %.    Follow Up Recommendations  SNF     Equipment Recommendations  None recommended by PT    Recommendations for Other Services       Precautions / Restrictions      Mobility  Bed Mobility               General bed mobility comments: OOB in recliner  Transfers Overall transfer level: Needs assistance Equipment used: Rolling walker (2 wheeled) Transfers: Sit to/from Stand Sit to Stand: Min assist         General transfer comment: 25% VC's on proper hand placement to increase self performance to push up    25% VC's to reach back to prior to sit  Ambulation/Gait Ambulation/Gait assistance: Min assist Ambulation Distance (Feet): 350 Feet Assistive device: Rolling walker (2 wheeled) Gait Pattern/deviations: Step-to pattern;Step-through pattern Gait velocity: decreased   General Gait Details: No longer using EVA walker.  Had pt amb with RW + 2 assist for safety such that recliner follows with pt as he sits quickly with fatigue. Avg RA 98% and HR 87.  Tolerating increased distance each session.     Stairs            Wheelchair Mobility    Modified Rankin (Stroke Patients Only)       Balance                                    Cognition                            Exercises      General Comments        Pertinent Vitals/Pain      Home Living                      Prior Function            PT Goals (current goals can now be found in the care plan section) Acute Rehab PT Goals Time For Goal Achievement: 02/18/16 Potential to Achieve Goals: Good Progress towards PT goals: Progressing toward goals    Frequency    Min  3X/week      PT Plan Current plan remains appropriate    Co-evaluation             End of Session Equipment Utilized During Treatment: Gait belt Activity Tolerance: Patient tolerated treatment well Patient left: in chair;with call bell/phone within reach;with family/visitor present     Time: [id number] PT Time Calculation (min) (ACUTE ONLY): 29 min  Charges:  $Gait Training: 8-22 mins $Therapeutic Activity: 8-22 mins                    G Codes:      Felecia ShellingLori Rosalie Buenaventura  PTA [facility]  Acute  Rehab Pager      [phone]

## 2016-02-04 NOTE — Progress Notes (Signed)
Nutrition Follow-up  DOCUMENTATION CODES:   Severe malnutrition in context of acute illness/injury  INTERVENTION:   -TPN per Pharmacy -TF intiation per surgery  Tube Feeding Recommendations: Initiate Vital 1.5 @ 10 ml/hr via J-tube and increase by 10 ml every 24 hours to goal rate of 60 ml/hr.  Tube feeding regimen provides 2160 kcal (100% of needs), 97 grams of protein, and 1100 ml of H2O.   RD will continue to monitor for plan.  NUTRITION DIAGNOSIS:   Inadequate oral intake related to inability to eat as evidenced by NPO status.  Ongoing.  GOAL:   Patient will meet greater than or equal to 90% of their needs  Meeting with TPN  MONITOR:   Labs, Weight trends, Skin, I & O's, Other (Comment) (TPN cycles)  ASSESSMENT:   75 year-old male with a medical history of GERD, PUD, and CAD who presented to [facility] EMS with a cc of nausea, vomiting, and diarrhea. Patient is s/p Robotic anterior and posterior vagotomy, BI anastomosis, DOR fundoplication, omentopexy 09/2015 for partial GOO secondary to pyloric stricture, followed by placement of feeding jejunostomy and gastrostomy tube and EGD balloon dilation of duodenal stricture 12/01/15 for recurrent GOO secondary to edema at anastomosis and failure to thrive with malnutrition. On 12/03/15 the patient was taken to the OR for a diagnostic laparoscopy, omentopexy of jejunal disruption, and washout for jejunal disruption (pt pulled out jejunostomy tube) and gastric leak.  12/20: IR failed to placed G-J tube  Patient's G-tube was converted to a G-J tube today. Per radiology note, J-tube is ready to use. Medications to be given via IV. Nothing to go through G-tube, set to gravity. Spoke with surgical PA, states that a study will be done on J-tube prior to restarting TF. Will provide recommendations above if feeds are to restarted.  Medications: Dulcolax suppository daily, KCl IV, IV Zofran  PRN Labs reviewed: CBGs: 101-107 Low Na Mg/Phos WNL  Patient continuing cyclic TPN, providing 2060 kcal and 100g protein.  Plan per Pharmacy 12/21: At 1800 today: Continue 12-hr TPN cycle  Continue with Clinimix E 5/, 2000 ml given over 12h cycle as follows (d/w patient's RN):  ? 1800 tonight: 350ml/hr x 1 hr ? 1900 tonight: 190 ml/hr x 10h ? 0500 tomorrow (12/17): reduce rate to 50 ml/hr x 1 hr, then off  20% fat emulsion at 10 ml/hr x 12 hr - to deliver 240 kCal/day  Diet Order:  Diet NPO time specified TPN (CLINIMIX-E) Adult  Skin:     Last BM:  12/18  Height:   Ht Readings from Last 1 Encounters:  01/21/16 6\' 2"  (1.88 m)    Weight:   Wt Readings from Last 1 Encounters:  02/04/16 181 lb (82.1 kg)    Ideal Body Weight:  86.4 kg  BMI:  Body mass index is 23.24 kg/m.  Estimated Nutritional Needs:   Kcal:  2000-2200  Protein:  100-115g  Fluid:  2L/day  EDUCATION NEEDS:   No education needs identified at this time  Tilda FrancoLindsey Colby Reels, MS, RD, LDN Pager: [phone] After Hours Pager: [phone]

## 2016-02-04 NOTE — Progress Notes (Signed)
[name].PHARMACY - ADULT TOTAL PARENTERAL NUTRITION CONSULT NOTE   Pharmacy Consult for TPN Indication: PTA TPN continuation, intolerance to enteral feed  Insulin Requirements: 6 units Novolog SSI yesterday  Current Nutrition:  - Clinimix as below  - Tube feeds stopped for nausea 12/19 P[facility] TPN [facility] 405 grams (1376 kcal)  of dextrose, 75 grams (300 kcal) of protein and 50 grams ( 480 kcal) of lipid for a total of 2156 kcal/day  IVF: NS at 80 ml/hr 6a to 6P when TPN off  Central access: PICC TPN start date: PTA, restarted on admission on 12/6   ASSESSMENT                                                                                                          HPI: [age] yoM with hx PUD, CAD, GERD, BPH,  s/p multiple procedures  and extended hospitalization for recurrent partial GOO 2/2 pyloric stricture on TPN for several days, discharged 11/15 with J/G tube in place on TFs, unable to tolerate and [facility], readmitted 12/6 with partial SBO. Pharmacy consulted to resume TPN.  Significant events:  12/6: TPN continued on admission. [facility]: Pt pulled out NGT, not replaced, remains NPO 12/8: trial of ice chips 12/10: continue TPN, plan swallow study Monday 12/11, if no aspiration, will start to advance diet.   12/11: Awaiting MBS.  Having bowel function.  Continue TPN for now.   12/12: Severe aspiration risk per MBS yesterday - recommend continuing NPO. Osmolite 1.2 CAL via TF BID ordered 12/11 at 6pm. Per discussion with CCS PA, continue TPN at current goal rate today. F/u for ability to wean tomorrow if tolerates TF. 12/13: Did not tolerate bolus feeds through G-tube; overnight emesis and aspiration event, started on Zosyn. CCS trying trickle feeds today. 12/14 transition to cyclic TPN - appears recovery may be prolonged. Not tolerating trickle feeds; G-tube to drainage.  12/15: TPN was not  run at cyclic rate overnight d/t conflicting information in TPN order. To provide patient full needed nutrition, will continue to run TPN at 83 ml/hr over 24 hr and begin 18-hr cycle with tonight's TPN 12/16: start 12h TPN cycle 12/18: resume trickle tube feed at 5 ml/hr 12/19: Nausea/vomiting, tube feed held. TPN formula to E 5/20 (more dextrose than E 5/15 formulation previously used) 12/21: successful transition of G tube to GJ tube. Plan scan for patency before resuming tube feeds.   Today, 02/04/2016:   Glucose - No hx DM, CBGs increasing with 5/20 formula, SSI q6h  Electrolytes - 12/21 labs - Na remains low but improved, Mg low end of normal  Renal - SCr WNL  I/O =  +1.3L/ 24h (drain CHARTED 0 output past 24 hr- doubt value)  LFTs - AST/ALT, AlkPhos back down today after rising yesterday; appear to be rising and falling periodically  TGs - 177 ( 11/27), 93 (12/7), 119 (12/11) 148 (12/18)  Prealbumin - 15.3 ( 11/13), 10.8 (  12/7), 12.8 (12/11) 13.1 (12/18)  NUTRITIONAL GOALS                                                                                             RD recs (12/7): Kcal 2000-2200, Protein 100-115g, Fluid 2L/day  [facility] TPN: 2156 Kcal/day, Protein 75g/day  Clinimix E 5/15 at a goal rate of 90 ml/hr + 20% fat emulsion at  10 ml/hr to provide: 108 g/day protein, 2013 Kcal/day. Clinimix E 5/20 2L on cyclic regimen + 120RKoreGundersen Boscobel Area Hospital And Clinicsa2.13oMarylaPakisJan<MEAOaks Surgery Center LCorliss Sk2LorrMarylaPakisJannet<MEGulf Coast Treatment CenteCorliss Sk2LorrMarylaPaNorthshore Healthsystem Dba Glenbrook HospitaCorliss Sk2LorrMarylaPakisJannett RMetrowest MediKoreacalUTallahassee Memorial HospitaCorliss SkCarroll County Eye Surgery Center LLCorliss Sk2LorrMarylaPakisJannett <MEASUREMENKoreaT>RUPrismaHorizon Specialty Hospital - Las VegaCorliss Sk2LorrMarylaPakisJannet<MEASURLarue D Carter Memorial HospitaCorliss Sk2LorrMarylaPakisJannet RMiddle Park Medical CenteCorliss Sk2LorrMarylaPakisJanneSpecialty Rehabilitation Hospital Of CoushattCorliss Sk2LorrMaryPeacehealth Peace Island Medical Gross[facility] LCorliss Sk2LorrMarylaPakisJannettHigh Point Treatment CenteCorlisMidstate Medical CenteCorliss Sk2LorrMarylaPakiMercy Hospital JopliCorliss Sk2LorrMarylaPakisJannett <MEASURERehabilitation Hospital Of Northwest Ohio LLCorliss Sk2LorrMarylaPakisJannett Good Samaritan Hospital-Los AngeleCorliss Sk2LorrMarylaPakisJannett C RRobert Wood JohMercy Hospital JopliCorliss Sk2LorrMarylaPakisJannett C(<MEALock Haven HospitaCorliss Sk2LorrMarylaPakisJannett <MEASKoUniversity Endoscopy CenteCorliss Sk2LorrMarFond Du Lac Cty Acute Psych UniCorlisSt Anthony'S Rehabilitation HospitaCorliss Sk2LorrMarylaPakisJannett C KoreaRSoUSanfReid Hospital & Health Care ServiceCorliss Sk2LorrMarylaPakisJannett 481 Asc Project LLCorliss Sk2LorrMarylaPakisJannett <MEASUREGeorgia Neurosurgical Institute Outpatient Surgery CenteCorliss Sk2LorrMarylaPakisJannett <MEASUREThibodaux Endoscopy LLCorliss Sk2LorrMarylaPakisJannett C<MEASURKoreaEMEUGastroenter57TherPaulding County HospitaCorliss Sk2LorrMarylaPakisJannett C<MEASMesquite Surgery Center LLCorliss Sk2LorrMarylaPakisJannett C<MEASUTeton Valley HealthMainegeneral Medical CenteCorliss Sk2LorrMarylaPakisJannetBayhealth Hospital Sussex CampuCorliss Sk2LorrMarylaPakisJannett RCottoMarshfield Medical Center LadysmitCorliss Sk2LorrMarylaPakisJannet ROceans BehaviorRockville General HospitaCorliss Whitfield Medical/Surgical HospitaCorliss Sk2LorrMarylaPakisJannet<MEASUREMENTKorea>7232Southwest Washington Medical Center - Memorial CampuCorliss Sk2LoPa RPremiKoreaer UCh AmbulatorAdvanced Ambulatory Surgical Bergen Regional Medical CenteCorliss Sk2LorrMarylaPend Oreille Surgery Center LLCorliss Sk2LorrMarylaPakisJannett RSouthKoreaernUSanta Barbara Endoscopy Center LLCorliss Sk2LorrMarylaPakisJannett C([id number])Rudene ReC Dba Edina Spe2Therapist, occuJeanRocky Mountain Surgery Center [id number] order of Clinimix solution **  PLAN                                                                                                                          At 1800 today: Continue 12-hr TPN cycle  Continue with Clinimix E 5/, 2000 ml given over 12h cycle as follows (d/w patient's RN):   1800 tonight: 50ml/hr x 1 hr  1900 tonight: 190 ml/hr x 10h  0500 tomorrow (12/17): reduce rate to 50 ml/hr x 1 hr, then off  20% fat emulsion at 10 ml/hr x 12  hr - to deliver 240 kCal/day  [facility] currently now has 0 Clinimix bags remaining of the E5/15 2L formulation previously used, TPN formula changed to E 5/20  TPN to contain standard multivitamins and trace elements.  CBG coverage per cyclic rates: would likely need to add insulin to TPN tomorrow, hopeful for resumption of tube feed and decrease of TPN rate  TPN lab panels on Mondays & Thursdays,   Continue NS at 80 ml/hr x 12 h (6a - 6p) while TPN off  Tashi Band L PharmD Pager [phone] 02/04/2016, 12:49 PM

## 2016-02-05 ENCOUNTER — Inpatient Hospital Stay (HOSPITAL_COMMUNITY): Payer: Medicare Other

## 2016-02-05 LAB — CBC
HCT: 23.4 % — ABNORMAL LOW (ref 39.0–52.0)
HEMOGLOBIN: 7.6 g/dL — AB (ref 13.0–17.0)
MCH: 29.7 pg (ref 26.0–34.0)
MCHC: 32.5 g/dL (ref 30.0–36.0)
MCV: 91.4 fL (ref 78.0–100.0)
Platelets: 471 10*3/uL — ABNORMAL HIGH (ref 150–400)
RBC: 2.56 MIL/uL — AB (ref 4.22–5.81)
RDW: 17.9 % — ABNORMAL HIGH (ref 11.5–15.5)
WBC: 9.4 10*3/uL (ref 4.0–10.5)

## 2016-02-05 LAB — GLUCOSE, CAPILLARY
GLUCOSE-CAPILLARY: 96 mg/dL (ref 65–99)
Glucose-Capillary: 112 mg/dL — ABNORMAL HIGH (ref 65–99)

## 2016-02-05 MED ORDER — TRACE MINERALS CR-CU-MN-SE-ZN 10-1000-500-60 MCG/ML IV SOLN
INTRAVENOUS | Status: AC
  Administered 2016-02-05: 17:00:00 via INTRAVENOUS
  Filled 2016-02-05 (×2): qty 2000

## 2016-02-05 MED ORDER — FAT EMULSION 20 % IV EMUL
120.0000 mL | INTRAVENOUS | Status: AC
  Administered 2016-02-05: 120 mL via INTRAVENOUS
  Filled 2016-02-05: qty 200

## 2016-02-05 MED ORDER — INSULIN ASPART 100 UNIT/ML ~~LOC~~ SOLN
0.0000 [IU] | SUBCUTANEOUS | Status: DC
  Administered 2016-02-06: 1 [IU] via SUBCUTANEOUS
  Administered 2016-02-07: 5 [IU] via SUBCUTANEOUS
  Administered 2016-02-07: 2 [IU] via SUBCUTANEOUS
  Administered 2016-02-07: 5 [IU] via SUBCUTANEOUS
  Administered 2016-02-08: 2 [IU] via SUBCUTANEOUS

## 2016-02-05 MED ORDER — PANTOPRAZOLE SODIUM 40 MG IV SOLR
40.0000 mg | Freq: Every day | INTRAVENOUS | Status: DC
  Administered 2016-02-05 – 2016-02-26 (×22): 40 mg via INTRAVENOUS
  Filled 2016-02-05 (×23): qty 40

## 2016-02-05 NOTE — Progress Notes (Signed)
PT Cancellation Note  Patient Details Name: Ardyth GalRalph T Micciche MRN: [id number] DOB: 1940-04-26   Cancelled Treatment:    Reason Eval/Treat Not Completed: Patient at procedure or test/unavailable   Rebeca AlertJannie Herby Amick, MPT Pager: [phone]

## 2016-02-05 NOTE — Progress Notes (Signed)
[name].PHARMACY - ADULT TOTAL PARENTERAL NUTRITION CONSULT NOTE   Pharmacy Consult for TPN Indication: PTA TPN continuation, intolerance to enteral feed  Insulin Requirements: 2 units Novolog SSI last 24h  Current Nutrition:  - Clinimix as below  - Tube feeds stopped for nausea 12/19 PTA TPN [facility]: [facility] received 405 grams (1376 kcal)  of dextrose, 75 grams (300 kcal) of protein and 50 grams ( 480 kcal) of lipid for a total of 2156 kcal/day  IVF: NS at 80 ml/hr 6a to 6P when TPN off  Central access: PICC TPN start date: PTA, restarted on admission on 12/6   ASSESSMENT                                                                                                          HPI: [age] yoM with hx PUD, CAD, GERD, BPH,  s/p multiple procedures  and extended hospitalization for recurrent partial GOO 2/2 pyloric stricture on TPN for several days, discharged 11/15 with J/G tube in place on TFs, unable to tolerate and had to [facility], readmitted 12/6 with partial SBO. Pharmacy consulted to resume TPN.  Significant events:  12/6: TPN continued on admission. [facility]: Pt pulled out NGT, not replaced, remains NPO 12/8: trial of ice chips 12/10: continue TPN, plan swallow study Monday 12/11, if no aspiration, will start to advance diet.   12/11: Awaiting MBS.  Having bowel function.  Continue TPN for now.   12/12: Severe aspiration risk per MBS yesterday - recommend continuing NPO. Osmolite 1.2 CAL via TF BID ordered 12/11 at 6pm. Per discussion with CCS PA, continue TPN at current goal rate today. F/u for ability to wean tomorrow if tolerates TF. 12/13: Did not tolerate bolus feeds through G-tube; overnight emesis and aspiration event, started on Zosyn. CCS trying trickle feeds today. 12/14 transition to cyclic TPN - appears recovery may be prolonged. Not tolerating trickle feeds; G-tube to drainage.  12/15: TPN was not  run at cyclic rate overnight d/t conflicting information in TPN order. To provide patient full needed nutrition, will continue to run TPN at 83 ml/hr over 24 hr and begin 18-hr cycle with tonight's TPN 12/16: start 12h TPN cycle 12/18: resume trickle tube feed at 5 ml/hr 12/19: Nausea/vomiting, tube feed held. TPN formula to E 5/20 (more dextrose than E 5/15 formulation previously used) 12/21: successful transition of G tube to GJ tube. Plan scan for patency before resuming tube feeds.  12/22: Upper GI scan in IR today to ensure J-tube working.  RD recommendations for TFs noted.    Today, 02/05/2016:   Glucose - No hx DM, CBGs increasing with 5/20 formula, SSI q6h  Electrolytes - 12/21 labs - Na remains low but improved, Mg low end of normal  Renal - SCr WNL, stable  I/O =  +2.3L, drain -950cc  LFTs - AST/ALT, AlkPhos WNL  TGs - 177 ( 11/27), 93 (12/7), 119 (12/11) 148 (12/18)  Prealbumin - 15.3 ( 11/13),  10.8 (12/7), 12.8 (12/11) 13.1 (12/18)  NUTRITIONAL GOALS                                                                                             RD recs (12/7): Kcal 2000-2200, Protein 100-115g, Fluid 2L/day  [facility] 2156 Kcal/day, Protein 75g/day  Clinimix E 5/15 at a goal rate of 90 ml/hr + 20% fat emulsion at  10 ml/hr to provide: 108 g/day protein, 2013 Kcal/day. Clinimix E 5/20 2L on cyclic regimen + 120ml of 20% Fat emulsion to provide 100gm protein, 2060 kCal/day  **There is currently a Acupuncturistnational backorder of Clinimix solution ** [facility] currently now has 0 Clinimix bags remaining of the E5/15 2L formulation previously used, TPN formula changed to E 5/20  PLAN                                                                                                                          At 1800 today: Continue 12-hr TPN cycle  Add 10 units novolog to TPN  Continue with Clinimix E 5/20, 2L given over 12h cycle as follows (d/w patient's RN):   1800 tonight: 2950ml/hr x  1 hr  1900 tonight: 190 ml/hr x 10h  0500 tomorrow (12/17): reduce rate to 50 ml/hr x 1 hr, then off  20% fat emulsion at 10 ml/hr x 12 hr - to deliver 240 kCal/day  TPN to contain standard multivitamins and trace elements.  CBG coverage per cyclic rates  F/u plan for resuming tube feeds   TPN lab panels on Mondays & Thursdays,   Continue NS at 80 ml/hr x 12 h (6a - 6p) while TPN off  F/u daily  Haynes Hoehnolleen Davidjames Blansett, PharmD, BCPS 02/05/2016, 8:17 AM  Pager: [phone]

## 2016-02-05 NOTE — Progress Notes (Signed)
NUTRITION NOTE   Pt seen for full assessment by RD 12/21 with associated note at 1455. Plan for upper GI and small bowel follow-through study in IR today prior to initiation of TF via newly placed G-tube. No N/V or abdominal pain this AM.   Tube Feeding Recommendations: Initiate Vital 1.5 @ 10 ml/hr via J-tube and increase by 10 ml every 24 hours to goal rate of 60 ml/hr.  At goal rate, Vital 1.5 @ 60 mL/hr, will provide 2160 kcal (100% of needs), 97 grams of protein, and 1100 ml of H2O.   He continues with cyclic TPN: Clinimix E 5/20 @ 50 mL/hr x1 hour, 190 mL/hr x10 hours, 50 mL/hr x1 hour (12 hours and 2L TPN during that time frame). Also receiving 20% ILE @ 10 mL/hr x12 hours. This regimen in total is providing 2000 kcal and 100 grams of protein to exactly meet minimal estimated kcal and protein needs.  IVF: NS @ 80 mL/hr from 0600-1800/day (960 mL).   RD will follow-up 12/24 to assess TF regimen and make changes as needed. Recommend weaning TPN when TF reaches 30 mL/hr.    Estimated Nutritional Needs:  Kcal:  2000-2200 Protein:  100-115g Fluid:  2L/day   Danny GammonJessica Michaeal Davis, MS, RD, LDN, CNSC Inpatient Clinical Dietitian Pager # [phone] After hours/weekend pager # [phone]

## 2016-02-05 NOTE — Progress Notes (Signed)
Patient ID: [name], male   DOB: 1941/01/03, 75 y.o.   MRN: [id number]  [facility]     Subjective: Denies any abdominal pain today. No recent n/v. He does have a slight cough, nonproductive.   Objective: Vital signs in last 24 hours: Temp:  [97.9 F (36.6 C)-98.7 F (37.1 C)] 97.9 F (36.6 C) (12/22 0540) Pulse Rate:  [73-104] 73 (12/22 0823) Resp:  [15-18] 16 (12/22 0823) BP: (116-146)/(45-64) 133/45 (12/22 0540) SpO2:  [95 %-100 %] 97 % (12/22 0823) FiO2 (%):  [28 %] 28 % (12/22 0823) Weight:  [180 lb 6.4 oz (81.8 kg)] 180 lb 6.4 oz (81.8 kg) (12/22 0500) Last BM Date: 02/01/16  Intake/Output from previous day: 12/21 0701 - 12/22 0700 In: 4279.8 [I.V.:4229.8; IV Piggyback:50] Out: 1925 [Urine:975; Drains:950] Intake/Output this shift: No intake/output data recorded.  PE: Gen:  Alert, NAD, pleasant HEENT: trach collar, some gurgling Pulm: unlabored, CTAB with decreased breath sounds lower lobes bilaterally, no wheezes Abd: Soft, nontender, nondistended, +BS, G-J tube site clean  Lab Results:   Recent Labs  02/04/16 0438 02/05/16 0554  WBC 8.2 9.4  HGB 7.6* 7.6*  HCT 23.0* 23.4*  PLT 440* 471*   BMET  Recent Labs  02/03/16 0422 02/04/16 0438  NA 133* 134*  K 4.0 4.0  CL 105 106  CO2 25 24  GLUCOSE 148* 163*  BUN 41* 37*  CREATININE 0.83 0.78  CALCIUM 8.1* 8.1*   PT/INR No results for input(s): LABPROT, INR in the last 72 hours. CMP     Component Value Date/Time   NA 134 (L) 02/04/2016 0438   NA 139 01/14/2016   K 4.0 02/04/2016 0438   CL 106 02/04/2016 0438   CO2 24 02/04/2016 0438   GLUCOSE 163 (H) 02/04/2016 0438   BUN 37 (H) 02/04/2016 0438   BUN 23 (A) 01/14/2016   CREATININE 0.78 02/04/2016 0438   CALCIUM 8.1 (L) 02/04/2016 0438   PROT 5.5 (L) 02/04/2016 0438   ALBUMIN 1.5 (L) 02/04/2016 0438   AST 25 02/04/2016 0438   ALT 41 02/04/2016 0438   ALKPHOS 144 (H) 02/04/2016 0438   BILITOT 0.3  02/04/2016 0438   GFRNONAA >60 02/04/2016 0438   GFRAA >60 02/04/2016 0438   Lipase     Component Value Date/Time   LIPASE 55 (H) 01/20/2016 0158       Studies/Results: Ir Adele SchilderGastr Tamsen Sniderube Convert Gastr-jej Per W/fl Mod Sed  Result Date: 02/04/2016 INDICATION: 75 year old male with dysphagia and a gastric outlet obstruction. He currently has a 4830 JamaicaFrench percutaneous gastrostomy tube requires conversion to a gastrojejunal tube. Today's procedure will represent a second attempt under conscious sedation as the patient had great discomfort trying to pass the tube beyond the gastric outlet obstruction. EXAM: CONVERT G-TUBE TO G-JTUBE MEDICATIONS: None ANESTHESIA/SEDATION: Versed 1 mg IV; Fentanyl 25 mcg IV Moderate Sedation Time:  15 minutes The patient was continuously monitored during the procedure by the interventional radiology nurse under my direct supervision. CONTRAST:  10 ISOVUE-300 IOPAMIDOL (ISOVUE-300) INJECTION 61% - administered into the jejunum. FLUOROSCOPY TIME:  Fluoroscopy Time: 5 minutes 18 seconds (66 mGy). COMPLICATIONS: None immediate. PROCEDURE: Informed written consent was obtained from the patient after a thorough discussion of the procedural risks, benefits and alternatives. All questions were addressed. Maximal Sterile Barrier Technique was utilized including caps, mask, sterile gowns, sterile gloves, sterile drape, hand hygiene and skin antiseptic. A timeout was performed prior to the initiation of the procedure. Following adequate  sedation, a 5 [place] cobra catheter was advanced over a stiff Glidewire coaxially through the existing 30 [place] gastrostomy tube. The catheter was used to navigate through the high-grade stenosis in the region of the gastric antrum and into the duodenum. The catheter and wire system were then successfully navigated into the proximal jejunum. The stiff Glidewire was then exchanged for an Amplatz wire. The catheter was removed. The existing 30 French  gastrostomy tube was also removed. The stricture at the gastric outlet was then sequentially dilated beginning at 8 [place] to a maximal diameter of the 18 [place]. A 30 French gastrojejunal tube was then carefully advanced over the wire, beyond the stenosis and into the proximal small bowel. The wire was removed. The tip of the jejunal catheter was confirmed by a gentle hand injection of contrast material under real-time fluoroscopy. The tip is in the proximal jejunum. The retention balloon was inflated with 10 mL of dilute contrast material and pulled snug against the anterior abdominal wall. The external bumper was fixed in place. The patient tolerated the procedure well. IMPRESSION: Successful conversion of gastrostomy tube to a new 30 French percutaneous gastro jejunostomy tube. The tip of the J arm is located in the proximal jejunum and is ready for immediate use. Signed, Sterling Big, MD [facility]iology Electronically Signed   By: Malachy Moan M.D.   On: 02/04/2016 10:38   Ir Replc Gastro/colonic Tube Percut W/fluoro  Result Date: 02/03/2016 INDICATION: Status post antrectomy with a severe stricture at the antrectomy small bowel anastomosis. Associated gastric obstruction and partial small bowel obstruction. EXAM: FLUOROSCOPIC ATTEMPT AT CONVERTING THE 30 FRENCH G-TUBE TO A GJ TUBE. MEDICATIONS: NONE ANESTHESIA/SEDATION: NONE. CONTRAST:  10 ISOVUE-300 IOPAMIDOL (ISOVUE-300) INJECTION 61% - administered into the gastric lumen. FLUOROSCOPY TIME:  Fluoroscopy Time: 10 minutes 18 seconds (100 mGy). COMPLICATIONS: None immediate. PROCEDURE: Informed written consent was obtained from the THE PATIENT AND HIS FAMILY after a thorough discussion of the procedural risks, benefits and alternatives. All questions were addressed. Maximal Sterile Barrier Technique was utilized including caps, mask, sterile gowns, sterile gloves, sterile drape, hand  hygiene and skin antiseptic. A timeout was performed prior to the initiation of the procedure. Under fluoroscopy, the existing 30 [place] G-tube was removed over a guidewire. A C2 catheter and Glidewire were utilized to manipulate the access through the distal antrectomy small bowel anastomosis. Guidewire was able to be advanced into the proximal jejunum. Several attempts were made to pass the 30 Jamaica GJ tube however the J portion of the feeding tube would not pass through this stricture. Guidewire was exchanged for an Amplatz guidewire. Attempts were made to serially dilate the stricture however the patient would not tolerate this secondary to abdominal pain. Therefore, the procedure was stopped. Over the guidewire, a 30 Jamaica G-tube was re- advanced into the stomach. Retention balloon inflated with 10 cc saline and retracted against anterior gastric wall. Position confirmed with contrast injection and secured externally. Patient tolerated the procedure well. IMPRESSION: Unsuccessful attempt at conversion to a GJ tube secondary to a distal stomach severe stricture which could not be traversed with the J portion of the feeding tube. PLAN: Gastrostomy to low wall suction overnight to decompress the stomach. Re- attempt at traversing distal stomach stricture tomorrow with conscious sedation. Electronically Signed   By: Judie Petit.  Shick M.D.   On: 02/03/2016 17:20   Dg Chest Port 1 View  Result Date: 02/04/2016 CLINICAL DATA:  Initial evaluation for pleural  effusion. EXAM: PORTABLE CHEST 1 VIEW COMPARISON:  Prior radiograph from 02/02/2016. FINDINGS: Tracheostomy tube in appropriate position overlying the upper airway. Cardiac and mediastinal silhouettes are stable, and remain within normal limits. Right PICC catheter in place with tip overlying the distal SVC, stable. Lungs are normally inflated. No pulmonary edema. There is a suspected persistent small left pleural effusion. Left basilar atelectasis is similar. There  is increased opacity at the right lung base, which may reflect progressive atelectasis or possibly infiltrate. No other new focal airspace disease. No pneumothorax. Osseous structures unchanged. IMPRESSION: 1. Probable persistent small left pleural effusion with similar left basilar atelectasis. 2. Increased opacity at the right lung base, which may reflect progressive atelectasis or infiltrate. Electronically Signed   By: Rise MuBenjamin  McClintock M.D.   On: 02/04/2016 06:01    Anti-infectives: Anti-infectives    Start     Dose/Rate Route Frequency Ordered Stop   01/26/16 2300  piperacillin-tazobactam (ZOSYN) IVPB 3.375 g  Status:  Discontinued     3.375 g 12.5 mL/hr over 240 Minutes Intravenous Every 8 hours 01/26/16 2256 02/04/16 0905   01/26/16 2230  vancomycin (VANCOCIN) IVPB 1000 mg/200 mL premix  Status:  Discontinued     1,000 mg 200 mL/hr over 60 Minutes Intravenous Every 12 hours 01/26/16 2227 01/26/16 2227   01/20/16 0400  vancomycin (VANCOCIN) IVPB 1000 mg/200 mL premix  Status:  Discontinued     1,000 mg 200 mL/hr over 60 Minutes Intravenous Every 12 hours 01/20/16 0323 01/21/16 0900   01/20/16 0330  ceFEPIme (MAXIPIME) 1 g in dextrose 5 % 50 mL IVPB     1 g 100 mL/hr over 30 Minutes Intravenous  Once 01/20/16 0317 01/20/16 0735       Assessment/Plan PSBO with 2 days of nausea and vomiting on admit 01/20/16 - S/P Robotic anterior and posterior vagotomy, BI anastomosis, DOR fundoplication, omentopexy for partial GOO 09/2015, DR. Viviann SpareSTEVEN GROSS  - Feeding Jejunostomy and gastrostomy/EGD balloon dilatation, secondary to edema at anastomosis, 12/01/15, DR. Karie SodaSteven Gross - S/p diagnostic laparoscopy, omentopexy of jejunal disruption, and washout for jejunal disruption (pt pulled out jejunostomy tube)and gastric leak. 12/03/15, Dr. Karie SodaSteven Gross (pt pulled out jejunostomy tube) - S/p IR conversion of gastric to gastrojejunal tube  Aspiration PNA - aspiration w/ start of bolus TF 12/12  and 12/18  - completed 9 day course of zosyn - WBC 9.4, afebrile  Severe protein calorie malnutrition - prealbumin 13.1 (12/18)  FEN: NPO, cyclic TPN, G-tube to gravity ID: cefepime 12/6 x1, vancomycin 12/6-12/7, Zosyn 12/13-12/21  DVT: Heparin/SCD   Plan: Successful conversion of gastric to gastrojejunal tube 12/21. Pending UGI with small bowel follow-through today.  Continue NPO, cyclic TPN, nothing per J-tube   LOS: 16 days    Edson SnowballBROOKE A MILLER , [facility] 02/05/2016, 8:34 AM Pager: [phone] Consults: [phone] Mon-Fri 7:00 am-4:30 pm Sat-Sun 7:00 am-11:30 am

## 2016-02-05 NOTE — Progress Notes (Signed)
[facility]. Patient remains in radiology and is to be moved to 6th floor afterwards. Family aware and belonging transfered

## 2016-02-05 NOTE — Progress Notes (Signed)
OT Cancellation Note  Patient Details Name: Ardyth GalRalph T Bigos MRN: [id number] DOB: 1940/10/08   Cancelled Treatment:    Reason Eval/Treat Not Completed: Patient at procedure or test/ unavailable  Will recheck on pt over the wkd. Lise AuerLori Kaelan Amble, [place] [phone] Einar CrowEDDING, Marien Manship D 02/05/2016, 10:44 AM

## 2016-02-05 NOTE — Progress Notes (Signed)
Trach CK not done due to pt in IR.

## 2016-02-05 NOTE — Clinical Social Work Note (Signed)
MSW extended other bed offers to patient's wife, [name] who plans to discuss offer from [facility] with dtr, [name].   MSW also shared that [facility] will not be able to manage trach care. Pt's wife expressed understanding.   Patient's family considering taking patient home with home health care. Pt's [facility].   [facility] also [facility] to [facility]. Family continues to decline. Family has declined all bed offers at this time.   No further concerns reported at this time. MSW will continue to follow patient and pt's family for continued support and to facilitate pt's discharge needs.   [name], MSW [phone] 02/05/2016 10:37 AM

## 2016-02-05 NOTE — Progress Notes (Addendum)
SLP Cancellation Note  Patient Details Name: [name] MRN: [id number] DOB: [birth year]   Cancelled treatment:       Reason Eval/Treat Not Completed: Other (comment) (pt in radiology for testing)  Pt may benefit from def[facility] vs [facility] setting to allow him to become stronger with improved pulmonary function and bowel issues to improve.  Would recommend to continue ice chip consumption after oral care to decrease disuse muscle atrophy.   A repeat instrumental evaluation * Fiberoptic Endoscopic Eval of Swallowing *FEES vs MBS* would be indicated when/if pt can tolerate prior to po initiation due to sensorimotor deficits with his dysphagia and aspiration.    Will sign off at this point, please reorder when/if indicate.   Mickie BailKimball, Gerardine Peltz Ann Sri Clegg Lookout MountainKimball, [place] [facility] SLP [phone]

## 2016-02-06 ENCOUNTER — Encounter (HOSPITAL_COMMUNITY): Admission: EM | Disposition: A | Discharge status: Rehabilitation Facility | Payer: Self-pay | Source: Home / Self Care

## 2016-02-06 ENCOUNTER — Inpatient Hospital Stay (HOSPITAL_COMMUNITY): Payer: Medicare Other | Admitting: Registered Nurse

## 2016-02-06 ENCOUNTER — Inpatient Hospital Stay (HOSPITAL_COMMUNITY): Payer: Medicare Other

## 2016-02-06 ENCOUNTER — Encounter (HOSPITAL_COMMUNITY): Payer: Self-pay | Admitting: Registered Nurse

## 2016-02-06 HISTORY — PX: LAPAROTOMY: SHX154

## 2016-02-06 LAB — GLUCOSE, CAPILLARY
Glucose-Capillary: 113 mg/dL — ABNORMAL HIGH (ref 65–99)
Glucose-Capillary: 134 mg/dL — ABNORMAL HIGH (ref 65–99)
Glucose-Capillary: 76 mg/dL (ref 65–99)
Glucose-Capillary: 90 mg/dL (ref 65–99)

## 2016-02-06 LAB — MRSA PCR SCREENING: MRSA by PCR: NEGATIVE

## 2016-02-06 LAB — PREPARE RBC (CROSSMATCH)

## 2016-02-06 SURGERY — LAPAROTOMY, EXPLORATORY
Anesthesia: General | Site: Abdomen

## 2016-02-06 MED ORDER — HYDROMORPHONE HCL 2 MG/ML IJ SOLN
INTRAMUSCULAR | Status: AC
  Filled 2016-02-06: qty 1

## 2016-02-06 MED ORDER — MIDAZOLAM HCL 5 MG/5ML IJ SOLN
INTRAMUSCULAR | Status: DC | PRN
  Administered 2016-02-06: 1 mg via INTRAVENOUS

## 2016-02-06 MED ORDER — FAT EMULSION 20 % IV EMUL
120.0000 mL | INTRAVENOUS | Status: AC
  Administered 2016-02-06: 120 mL via INTRAVENOUS
  Filled 2016-02-06: qty 200

## 2016-02-06 MED ORDER — SODIUM CHLORIDE 0.9 % IJ SOLN
INTRAMUSCULAR | Status: AC
  Filled 2016-02-06: qty 10

## 2016-02-06 MED ORDER — ROCURONIUM BROMIDE 10 MG/ML (PF) SYRINGE
PREFILLED_SYRINGE | INTRAVENOUS | Status: DC | PRN
  Administered 2016-02-06: 10 mg via INTRAVENOUS
  Administered 2016-02-06: 50 mg via INTRAVENOUS
  Administered 2016-02-06: 10 mg via INTRAVENOUS
  Administered 2016-02-06: 20 mg via INTRAVENOUS

## 2016-02-06 MED ORDER — ONDANSETRON HCL 4 MG/2ML IJ SOLN
INTRAMUSCULAR | Status: AC
  Filled 2016-02-06: qty 2

## 2016-02-06 MED ORDER — LIDOCAINE HCL (CARDIAC) 20 MG/ML IV SOLN
INTRAVENOUS | Status: DC | PRN
  Administered 2016-02-06: 100 mg via INTRAVENOUS

## 2016-02-06 MED ORDER — SODIUM CHLORIDE 0.9 % IV SOLN: Freq: Once | INTRAVENOUS | Status: DC

## 2016-02-06 MED ORDER — FENTANYL CITRATE (PF) 100 MCG/2ML IJ SOLN
INTRAMUSCULAR | Status: AC
  Filled 2016-02-06: qty 2

## 2016-02-06 MED ORDER — POLYVINYL ALCOHOL 1.4 % OP SOLN: 1.0000 [drp] | Freq: Three times a day (TID) | OPHTHALMIC | Status: DC | PRN

## 2016-02-06 MED ORDER — CYCLOSPORINE 0.05 % OP EMUL: 1.0000 [drp] | Freq: Two times a day (BID) | OPHTHALMIC | Status: DC

## 2016-02-06 MED ORDER — MAGIC MOUTHWASH: 15.0000 mL | Freq: Four times a day (QID) | ORAL | Status: DC | PRN

## 2016-02-06 MED ORDER — PIPERACILLIN-TAZOBACTAM 3.375 G IVPB
INTRAVENOUS | Status: AC
  Filled 2016-02-06: qty 50

## 2016-02-06 MED ORDER — ONDANSETRON HCL 4 MG/2ML IJ SOLN
INTRAMUSCULAR | Status: DC | PRN
  Administered 2016-02-06: 4 mg via INTRAVENOUS

## 2016-02-06 MED ORDER — MEPERIDINE HCL 50 MG/ML IJ SOLN: 6.2500 mg | INTRAMUSCULAR | Status: DC | PRN

## 2016-02-06 MED ORDER — PHENYLEPHRINE 40 MCG/ML (10ML) SYRINGE FOR IV PUSH (FOR BLOOD PRESSURE SUPPORT)
PREFILLED_SYRINGE | INTRAVENOUS | Status: DC | PRN
  Administered 2016-02-06 (×2): 80 ug via INTRAVENOUS

## 2016-02-06 MED ORDER — LIDOCAINE 2% (20 MG/ML) 5 ML SYRINGE
INTRAMUSCULAR | Status: AC
  Filled 2016-02-06: qty 5

## 2016-02-06 MED ORDER — FENTANYL CITRATE (PF) 100 MCG/2ML IJ SOLN
INTRAMUSCULAR | Status: DC | PRN
  Administered 2016-02-06 (×2): 50 ug via INTRAVENOUS
  Administered 2016-02-06: 100 ug via INTRAVENOUS

## 2016-02-06 MED ORDER — PIPERACILLIN-TAZOBACTAM 3.375 G IVPB 30 MIN
3.3750 g | Freq: Once | INTRAVENOUS | Status: AC
  Administered 2016-02-06: 3.375 g via INTRAVENOUS

## 2016-02-06 MED ORDER — CLINIMIX E/DEXTROSE (2.75/10) 2.75 % IV SOLN: 2160.0000 mL | INTRAVENOUS | Status: DC | PRN

## 2016-02-06 MED ORDER — PROPOFOL 10 MG/ML IV BOLUS
INTRAVENOUS | Status: DC | PRN
  Administered 2016-02-06: 90 mg via INTRAVENOUS

## 2016-02-06 MED ORDER — CHLORHEXIDINE GLUCONATE CLOTH 2 % EX PADS
6.0000 | MEDICATED_PAD | Freq: Every day | CUTANEOUS | Status: DC
  Administered 2016-02-06: 6 via TOPICAL

## 2016-02-06 MED ORDER — SUGAMMADEX SODIUM 200 MG/2ML IV SOLN
INTRAVENOUS | Status: AC
  Filled 2016-02-06: qty 2

## 2016-02-06 MED ORDER — SODIUM CHLORIDE 0.9 % IR SOLN
Status: DC | PRN
  Administered 2016-02-06: 5000 mL

## 2016-02-06 MED ORDER — DEXAMETHASONE SODIUM PHOSPHATE 10 MG/ML IJ SOLN
INTRAMUSCULAR | Status: AC
  Filled 2016-02-06: qty 1

## 2016-02-06 MED ORDER — MIDAZOLAM HCL 2 MG/2ML IJ SOLN
INTRAMUSCULAR | Status: AC
  Filled 2016-02-06: qty 2

## 2016-02-06 MED ORDER — TRACE MINERALS CR-CU-MN-SE-ZN 10-1000-500-60 MCG/ML IV SOLN
INTRAVENOUS | Status: AC
  Administered 2016-02-06: 19:00:00 via INTRAVENOUS
  Filled 2016-02-06: qty 2000

## 2016-02-06 MED ORDER — SUGAMMADEX SODIUM 200 MG/2ML IV SOLN
INTRAVENOUS | Status: DC | PRN
  Administered 2016-02-06: 170 mg via INTRAVENOUS

## 2016-02-06 MED ORDER — FENTANYL CITRATE (PF) 100 MCG/2ML IJ SOLN
25.0000 ug | INTRAMUSCULAR | Status: DC | PRN
  Administered 2016-02-06 (×2): 25 ug via INTRAVENOUS

## 2016-02-06 MED ORDER — PROPOFOL 10 MG/ML IV BOLUS
INTRAVENOUS | Status: AC
  Filled 2016-02-06: qty 20

## 2016-02-06 MED ORDER — HYDROMORPHONE HCL 1 MG/ML IJ SOLN
INTRAMUSCULAR | Status: DC | PRN
  Administered 2016-02-06 (×3): 0.5 mg via INTRAVENOUS

## 2016-02-06 MED ORDER — LACTATED RINGERS IV SOLN
INTRAVENOUS | Status: DC | PRN
  Administered 2016-02-06: 14:00:00 via INTRAVENOUS

## 2016-02-06 SURGICAL SUPPLY — 42 items
APPLICATOR COTTON TIP 6IN STRL (MISCELLANEOUS) ×1 IMPLANT
BLADE EXTENDED COATED 6.5IN (ELECTRODE) IMPLANT
BLADE HEX COATED 2.75 (ELECTRODE) ×3 IMPLANT
BNDG GAUZE ELAST 4 BULKY (GAUZE/BANDAGES/DRESSINGS) ×2 IMPLANT
COVER MAYO STAND STRL (DRAPES) IMPLANT
COVER SURGICAL LIGHT HANDLE (MISCELLANEOUS) ×3 IMPLANT
DRAPE LAPAROSCOPIC ABDOMINAL (DRAPES) ×3 IMPLANT
DRAPE WARM FLUID 44X44 (DRAPE) IMPLANT
ELECT REM PT RETURN 9FT ADLT (ELECTROSURGICAL) ×3
ELECTRODE REM PT RTRN 9FT ADLT (ELECTROSURGICAL) ×1 IMPLANT
GAUZE SPONGE 4X4 12PLY STRL (GAUZE/BANDAGES/DRESSINGS) ×3 IMPLANT
GLOVE BIO SURGEON STRL SZ7.5 (GLOVE) ×6 IMPLANT
GLOVE BIOGEL PI IND STRL 7.0 (GLOVE) ×1 IMPLANT
GLOVE BIOGEL PI INDICATOR 7.0 (GLOVE) ×2
GOWN STRL REUS W/ TWL XL LVL3 (GOWN DISPOSABLE) ×1 IMPLANT
GOWN STRL REUS W/TWL LRG LVL3 (GOWN DISPOSABLE) ×3 IMPLANT
GOWN STRL REUS W/TWL XL LVL3 (GOWN DISPOSABLE) ×6 IMPLANT
HANDLE SUCTION POOLE (INSTRUMENTS) IMPLANT
KIT BASIN OR (CUSTOM PROCEDURE TRAY) ×3 IMPLANT
LIGASURE IMPACT 36 18CM CVD LR (INSTRUMENTS) ×2 IMPLANT
NS IRRIG 1000ML POUR BTL (IV SOLUTION) ×11 IMPLANT
PACK GENERAL/GYN (CUSTOM PROCEDURE TRAY) ×3 IMPLANT
PAD ABD 8X10 STRL (GAUZE/BANDAGES/DRESSINGS) ×2 IMPLANT
RELOAD PROXIMATE 75MM BLUE (ENDOMECHANICALS) ×9 IMPLANT
RELOAD STAPLE 75 3.8 BLU REG (ENDOMECHANICALS) IMPLANT
SPONGE LAP 18X18 X RAY DECT (DISPOSABLE) IMPLANT
STAPLER GUN LINEAR PROX 60 (STAPLE) ×2 IMPLANT
STAPLER PROXIMATE 75MM BLUE (STAPLE) ×2 IMPLANT
STAPLER VISISTAT 35W (STAPLE) ×3 IMPLANT
SUCTION POOLE HANDLE (INSTRUMENTS)
SUT PDS AB 1 CTX 36 (SUTURE) IMPLANT
SUT SILK 2 0 (SUTURE) ×3
SUT SILK 2 0 SH CR/8 (SUTURE) IMPLANT
SUT SILK 2-0 18XBRD TIE 12 (SUTURE) IMPLANT
SUT SILK 3 0 (SUTURE)
SUT SILK 3 0 SH CR/8 (SUTURE) ×4 IMPLANT
SUT SILK 3-0 18XBRD TIE 12 (SUTURE) IMPLANT
TAPE CLOTH SURG 6X10 WHT LF (GAUZE/BANDAGES/DRESSINGS) ×2 IMPLANT
TOWEL OR 17X26 10 PK STRL BLUE (TOWEL DISPOSABLE) ×6 IMPLANT
TRAY FOLEY W/METER SILVER 16FR (SET/KITS/TRAYS/PACK) ×2 IMPLANT
WATER STERILE IRR 1500ML POUR (IV SOLUTION) ×1 IMPLANT
YANKAUER SUCT BULB TIP NO VENT (SUCTIONS) ×2 IMPLANT

## 2016-02-06 NOTE — Op Note (Signed)
01/19/2016 - 02/06/2016  9:16 PM  PATIENT:  [name]  75 y.o. male  PRE-OPERATIVE DIAGNOSIS:  SMALL BOWEL OBSTRUCTION  POST-OPERATIVE DIAGNOSIS:  SMALL BOWEL OBSTRUCTION  PROCEDURE:  Procedure(s): EXPLORATORY LAPAROTOMY, LYSIS OF ADHESIONS, SEGMENTAL SMALL BOWEL RESECTION, GASTROJEJUNOSTOMY (N/A)  SURGEON:  Surgeon(s) and Role:    * Griselda MinerPaul Toth III, MD - Primary    * Ovidio Kinavid Newman, MD - Assisting  PHYSICIAN ASSISTANT:   ASSISTANTS: Dr. Ezzard StandingNewman   ANESTHESIA:   general  EBL:  Total I/O In: 400 [I.V.:400] Out: 50 [Urine:50]  BLOOD ADMINISTERED:1 unit CC PRBC  DRAINS: Gastrostomy Tube   LOCAL MEDICATIONS USED:  NONE  SPECIMEN:  Source of Specimen:  small bowel segment  DISPOSITION OF SPECIMEN:  PATHOLOGY  COUNTS:  YES  TOURNIQUET:  * No tourniquets in log *  DICTATION: .Dragon Dictation   After informed consent was obtained the patient was brought to the operating room and placed in the supine position on the operating table. After adequate induction of general anesthesia the patient's abdomen was prepped with Betadine and draped in usual sterile manner. An appropriate timeout was performed. A midline incision was made with a 10 blade knife. The incision was carried through the skin and subcutaneous tissue sharply with electrocautery until the linea alba was identified. The linea alba was incised with the electrocautery. The preperitoneal space was probed bluntly with a hemostat until the peritoneum was opened and access was gained to the abdominal cavity. There were significant dense adhesions of small bowel to the abdominal wall and between loops of small bowel. Tedious sharp dissection with Metzenbaum scissors and some blunt finger dissection was undertaken to take the adhesions down. As we took the adhesions down we were able to start running the bowel. We did find a area of adhesion that appeared to be the side of the bowel obstruction. A small enterotomy was made at  this location during the dissection close of the dense nature of the adhesions. This was initially repaired with a 2-0 silk figure-of-eight stitch. Another dense adhesion was encountered on the left side of the abdomen and another enterotomy was created at this point in the process of taking the small bowel down and freeing it from other sites. We also were able to identify the site of the previous jejunostomy. This was very densely adherent to the abdominal wall and we decided not to take this down. We did decide to resect the 2 areas of injury to the small bowel. A site was chosen above and below the areas of perforation with the bowel appeared to be healthy. The mesentery to these points was opened sharply with the electrocautery. At each site a GIA-75 stapler was placed across the bowel, clamped, and fired thereby dividing the small bowel between staple lines. The mesentery to the injured segment of small bowel was then taken down sharply with the LigaSure. Next a small opening was made on the antimesenteric border of each limb of small bowel. Each side of A GIA 75 stapler was then placed down the appropriate limb of small bowel, clamped, and fired thereby creating a nice widely patent enteroenterostomy. The common opening was closed with a TA 60 stapler. The staple line was reinforced with 2-0 silk Lembert stitches and a 2-0 silk crotch stitch was also placed. After further examination there did appear to be a segment of small bowel distal to the old jejunostomy site and proximal to the new anastomosis that easily reached to the stomach. A  small opening was made on the segment of small bowel and on the very distal stomach. Each limb of a GI 75 stapler was then placed down the appropriate limb of stomach or small bowel, clamped, and fired thereby creating a nice widely patent gastrojejunostomy. The previous GJ tube was then brought into the stomach and the J portion of the tube was placed down the efferent limb  of small bowel. This tube actually crossed the new anastomosis. The common opening between the small bowel and stomach was then closed with interrupted 2-0 silk stitches. The abdomen was then irrigated with copious amounts of saline. The entire dissection was very difficult given the amount of adhesions within the abdominal cavity. Dr. Ezzard StandingNewman was the assistant and was present for the entire case. His presence was crucial to the success of the case. At the end of the case all needle sponge and ostomy counts were correct. The patient tolerated the procedure well. Sterile dressings were then applied. The patient was likely not going to be successfully extubated and was then taken to ICU for further resuscitation measures.  PLAN OF CARE: Admit to inpatient   PATIENT DISPOSITION:  ICU - intubated and hemodynamically stable.   Delay start of Pharmacological VTE agent (>24hrs) due to surgical blood loss or risk of bleeding: no

## 2016-02-06 NOTE — Transfer of Care (Signed)
Immediate Anesthesia Transfer of Care Note  Patient: [name]  Procedure(s) Performed: Procedure(s): EXPLORATORY LAPAROTOMY, LYSIS OF ADHESIONS, SEGMENTAL SMALL BOWEL RESECTION, GASTROJEJUNOSTOMY (N/A)  Patient Location: PACU  Anesthesia Type:General  Level of Consciousness: awake, alert , oriented and patient cooperative  Airway & Oxygen Therapy: Patient Spontanous Breathing and Patient connected to face mask oxygen  Post-op Assessment: Report given to RN, Post -op Vital signs reviewed and stable and Patient moving all extremities  Post vital signs: Reviewed and stable  Last Vitals:  Vitals:   02/06/16 0632 02/06/16 1037  BP: (!) 137/44 (!) 125/48  Pulse: 79 89  Resp: 18 18  Temp: 36.7 C 37.1 C    Last Pain:  Vitals:   02/06/16 1251  TempSrc:   PainSc: Asleep      Patients Stated Pain Goal: 3 (02/06/16 0206)  Complications: No apparent anesthesia complications

## 2016-02-06 NOTE — Anesthesia Preprocedure Evaluation (Addendum)
Anesthesia Evaluation  Patient identified by MRN, date of birth, ID band Patient awake    Reviewed: Allergy & Precautions, NPO status , Patient's Chart, lab work & pertinent test results  Airway Mallampati: Trach  TM Distance: >3 FB Neck ROM: Full    Dental no notable dental hx.    Pulmonary former smoker,    Pulmonary exam normal breath sounds clear to auscultation       Cardiovascular hypertension, Pt. on medications + CAD and + Cardiac Stents  Normal cardiovascular exam Rhythm:Regular Rate:Normal     Neuro/Psych negative neurological ROS  negative psych ROS   GI/Hepatic Neg liver ROS, PUD,   Endo/Other  negative endocrine ROS  Renal/GU negative Renal ROS  negative genitourinary   Musculoskeletal negative musculoskeletal ROS (+)   Abdominal   Peds negative pediatric ROS (+)  Hematology  (+) anemia ,   Anesthesia Other Findings   Reproductive/Obstetrics negative OB ROS                            Anesthesia Physical Anesthesia Plan  ASA: III  Anesthesia Plan: General   Post-op Pain Management:    Induction: Intravenous  Airway Management Planned: Tracheostomy  Additional Equipment:   Intra-op Plan:   Post-operative Plan:   Informed Consent: I have reviewed the patients History and Physical, chart, labs and discussed the procedure including the risks, benefits and alternatives for the proposed anesthesia with the patient or authorized representative who has indicated his/her understanding and acceptance.   Dental advisory given  Plan Discussed with: CRNA  Anesthesia Plan Comments:         Anesthesia Quick Evaluation

## 2016-02-06 NOTE — Anesthesia Procedure Notes (Signed)
Procedure Name: Intubation Date/Time: 02/06/2016 2:05 PM Performed by: Jarvis NewcomerARMISTEAD, Aubrionna Istre A Pre-anesthesia Checklist: Patient identified, Timeout performed, Emergency Drugs available, Suction available and Patient being monitored Patient Re-evaluated:Patient Re-evaluated prior to inductionOxygen Delivery Method: Circle system utilized Preoxygenation: Pre-oxygenation with 100% oxygen Intubation Type: IV induction and Inhalational induction Tube type: Reinforced Tube size: 6.5 mm Number of attempts: 1 Placement Confirmation: positive ETCO2 and breath sounds checked- equal and bilateral Tube secured with: Tape Dental Injury: Teeth and Oropharynx as per pre-operative assessment  Comments: Preexisting #6 uncuffed Shiley trach established in place. PreO2 via trach. Smooth IV/inhalation induction via trach. 6.5 reinforced ETT placed with ease via trach and secured.

## 2016-02-06 NOTE — Progress Notes (Signed)
Called Dr. Acey Lavarignan to have him explain intraoperative processes involving Tracheostomy as well as post op respiratory directions for RT. He spoke directly with RT in ICU

## 2016-02-06 NOTE — Progress Notes (Signed)
[facility]  Subjective:   [name] underwent exploratory laparotomy today. Immediately post-operatively there was concern he would require prolonged ventilator support through his tracheostomy. He did well once he came back to his hospital room and is tolerating trach collar. He has no respiratory complaints. He says that his abdomen and back are causing him severe pain which he is receiving morphine for.   Objective: Vitals:   02/06/16 2012 02/06/16 2030 02/06/16 2100 02/06/16 2130  BP:  (!) 131/51 (!) 163/70 (!) 139/41  Pulse:  (!) 106 (!) 109 (!) 108  Resp:  (!) 21 (!) 25 (!) 25  Temp: 97.7 F (36.5 C)     TempSrc: Oral     SpO2:  94% 94% 94%  Weight:      Height:         Exam: Awake, alert, resting quietly in bed, no distress Tracheostomy in place, no drainage nearby, PMV in place  Lungs clear with normal respiratory effort Cardiovascular regular rate and rhythm no murmurs gallops or rubs Neurologic: No distress, spontaneous movement of ext's  Abdomen: Midline wound dressed, feeding tube present MSK: 2 + pitting edema of legs   Labs and Radiology reviewed in EPIC  Impression:  Tracheostomy status Small bowel obstruction Recurrent aspiration  Severe protein calorie malnutrition Left pleural effusion   Discussion:  This is a 75 year old male who is trach/PEG and bed bound in the setting of recurrent SBO s/p multiple surgical interventions. He is tolerating his trach collar well and has no respiratory complaints currently.   Tracheostomy, mild hypoxic respiratory failure Requiring a small amount of added oxygen that may be due to splinting from abdominal surgery and perhaps pulmonary edema. Continuing trach collar. Will consider decannulation once he regains more strength and has less suctioning requirements for secretions.   Left pleural effusion Suspect hypervolemia related versus inflammatory reaction to abdominal processes. Would continue diuresis  as tolerated and monitor.  Cornell BarmanARUN Refugia Laneve MD  Pulmonary & Critical Care

## 2016-02-06 NOTE — Anesthesia Postprocedure Evaluation (Signed)
Anesthesia Post Note  Patient: [name]  Procedure(s) Performed: Procedure(s) (LRB): EXPLORATORY LAPAROTOMY, LYSIS OF ADHESIONS, SEGMENTAL SMALL BOWEL RESECTION, GASTROJEJUNOSTOMY (N/A)  Patient location during evaluation: PACU Anesthesia Type: General Level of consciousness: awake and alert Pain management: pain level controlled Vital Signs Assessment: post-procedure vital signs reviewed and stable Respiratory status: spontaneous breathing, nonlabored ventilation, respiratory function stable and patient connected to nasal cannula oxygen Cardiovascular status: blood pressure returned to baseline and stable Postop Assessment: no signs of nausea or vomiting Anesthetic complications: no       Last Vitals:  Vitals:   02/06/16 2100 02/06/16 2130  BP: (!) 163/70 (!) 139/41  Pulse: (!) 109 (!) 108  Resp: (!) 25 (!) 25  Temp:      Last Pain:  Vitals:   02/06/16 2118  TempSrc:   PainSc: 4                  Phillips Groutarignan, Dameion Briles

## 2016-02-06 NOTE — Progress Notes (Signed)
[name]PHARM[facility]Y - ADULT TOTAL PARENTERAL NUTRITION CONSULT NOTE   Pharmacy Consult for TPN Indication: PTA TPN continuation, intolerance to enteral feed  Insulin Requirements: 1 units Novolog SSI last 24h  Current Nutrition:  - Clinimix as below  - Tube feeds stopped for nausea 12/19 PTA TPN from advanced home care: [facility] pt received 405 grams (1376 kcal)  of dextrose, 75 grams (300 kcal) of protein and 50 grams ( 480 kcal) of lipid for a total of 2156 kcal/day  IVF: NS at 80 ml/hr 6a to 6P when TPN off  Central access: PICC TPN start date: PTA, restarted on admission on 12/6   ASSESSMENT                                                                                                          HPI: [age] yoM with hx PUD, CAD, GERD, BPH,  s/p multiple procedures  and extended hospitalization for recurrent partial GOO 2/2 pyloric stricture on TPN for several days, discharged 11/15 with J/G tube in place on TFs, unable to tolerate and had [facility], readmitted 12/6 with partial SBO. Pharmacy consulted to resume TPN.  Significant events:  12/6: TPN continued on admission. [facility]: Pt pulled out NGT, not replaced, remains NPO 12/8: trial of ice chips 12/10: continue TPN, plan swallow study Monday 12/11, if no aspiration, will start to advance diet.   12/11: Awaiting MBS.  Having bowel function.  Continue TPN for now.   12/12: Severe aspiration risk per MBS yesterday - recommend continuing NPO. Osmolite 1.2 CAL via TF BID ordered 12/11 at 6pm. Per discussion with [facility] PA, continue TPN at current goal rate today. F/u for ability to wean tomorrow if tolerates TF. 12/13: Did not tolerate bolus feeds through G-tube; overnight emesis and aspiration event, started on Zosyn. CCS trying trickle feeds today. 12/14 transition to cyclic TPN - appears recovery may be prolonged. Not tolerating trickle feeds; G-tube to drainage.  12/15: TPN was not  run at cyclic rate overnight d/t conflicting information in TPN order. To provide patient full needed nutrition, will continue to run TPN at 83 ml/hr over 24 hr and begin 18-hr cycle with tonight's TPN 12/16: start 12h TPN cycle 12/18: resume trickle tube feed at 5 ml/hr 12/19: Nausea/vomiting, tube feed held. TPN formula to E 5/20 (more dextrose than E 5/15 formulation previously used) 12/21: successful transition of G tube to GJ tube. Plan scan for patency before resuming tube feeds.  12/22: Upper GI scan in IR today to ensure J-tube working.  RD recommendations for TFs noted.    Today, 02/06/2016:   Glucose - No hx DM, CBGs < 150,  SSI q6h  Electrolytes - 12/21 labs - Na remains low but improved, Mg low end of normal  Renal - SCr WNL, stable  I/O =  +2.3L, drain -950cc  LFTs - AST/ALT, AlkPhos WNL  TGs - 177 ( 11/27), 93 (12/7), 119 (12/11) 148 (12/18)  Prealbumin - 15.3 ( 11/13), 10.8 (  12/7), 12.8 (12/11) 13.1 (12/18)  NUTRITIONAL GOALS                                                                                             RD recs (12/7): Kcal 2000-2200, Protein 100-115g, Fluid 2L/day  [facility]: 2156 Kcal/day, Protein 75g/day  Clinimix E 5/15 at a goal rate of 90 ml/hr + 20% fat emulsion at  10 ml/hr to provide: 108 g/day protein, 2013 Kcal/day. Clinimix E 5/20 2L on cyclic regimen + 120ml of 20% Fat emulsion to provide 100gm protein, 2060 kCal/day  **There is currently a Acupuncturistnational backorder of Clinimix solution ** [facility] pharmacy currently now has 0 Clinimix bags remaining of the E5/15 2L formulation previously used, TPN formula changed to E 5/20  PLAN                                                                                                                          At 1800 today: Continue 12-hr TPN cycle  Add 10 units novolog to TPN  Continue with Clinimix E 5/20, 2L given over 12h cycle as follows:   1800 tonight: 7450ml/hr x 1 hr  1900 tonight: 190 ml/hr x  10h  0500 tomorrow (12/17): reduce rate to 50 ml/hr x 1 hr, then off  20% fat emulsion at 10 ml/hr x 12 hr - to deliver 240 kCal/day  TPN to contain standard multivitamins and trace elements.  CBG coverage per cyclic rates  F/u plan for resuming tube feeds   TPN lab panels on Mondays & Thursdays,   Continue NS at 80 ml/hr x 12 h (6a - 6p) while TPN off  F/u daily  Arley Phenixllen Journe Hallmark RPh 02/06/2016, 10:23 AM Pager [phone]

## 2016-02-06 NOTE — Progress Notes (Signed)
OT Cancellation Note  Patient Details Name: [name] MRN: [id number] DOB: 06-03-1940   Cancelled Treatment:    Reason Eval/Treat Not Completed: Patient at procedure or test/ unavailable  [name] 02/06/2016, 2:59 PM  Marica OtterMaryellen Aila Terra, OTR/L [phone] 02/06/2016

## 2016-02-07 DIAGNOSIS — R5381 Other malaise: Secondary | ICD-10-CM

## 2016-02-07 DIAGNOSIS — R0902 Hypoxemia: Secondary | ICD-10-CM

## 2016-02-07 LAB — COMPREHENSIVE METABOLIC PANEL
ALK PHOS: 118 U/L (ref 38–126)
ALT: 30 U/L (ref 17–63)
AST: 26 U/L (ref 15–41)
Albumin: 1.3 g/dL — ABNORMAL LOW (ref 3.5–5.0)
Anion gap: 4 — ABNORMAL LOW (ref 5–15)
BILIRUBIN TOTAL: 0.3 mg/dL (ref 0.3–1.2)
BUN: 44 mg/dL — ABNORMAL HIGH (ref 6–20)
CALCIUM: 7.6 mg/dL — AB (ref 8.9–10.3)
CO2: 23 mmol/L (ref 22–32)
CREATININE: 1.03 mg/dL (ref 0.61–1.24)
Chloride: 106 mmol/L (ref 101–111)
Glucose, Bld: 88 mg/dL (ref 65–99)
Potassium: 4.7 mmol/L (ref 3.5–5.1)
Sodium: 133 mmol/L — ABNORMAL LOW (ref 135–145)
TOTAL PROTEIN: 4.7 g/dL — AB (ref 6.5–8.1)

## 2016-02-07 LAB — PHOSPHORUS: PHOSPHORUS: 3.8 mg/dL (ref 2.5–4.6)

## 2016-02-07 LAB — GLUCOSE, CAPILLARY
GLUCOSE-CAPILLARY: 66 mg/dL (ref 65–99)
GLUCOSE-CAPILLARY: 102 mg/dL — AB (ref 65–99)
GLUCOSE-CAPILLARY: 172 mg/dL — AB (ref 65–99)
Glucose-Capillary: 134 mg/dL — ABNORMAL HIGH (ref 65–99)
Glucose-Capillary: 268 mg/dL — ABNORMAL HIGH (ref 65–99)
Glucose-Capillary: 269 mg/dL — ABNORMAL HIGH (ref 65–99)
Glucose-Capillary: 51 mg/dL — ABNORMAL LOW (ref 65–99)
Glucose-Capillary: 82 mg/dL (ref 65–99)

## 2016-02-07 LAB — MAGNESIUM: MAGNESIUM: 1.6 mg/dL — AB (ref 1.7–2.4)

## 2016-02-07 MED ORDER — DEXTROSE 50 % IV SOLN
1.0000 | Freq: Once | INTRAVENOUS | Status: AC
  Administered 2016-02-07: 50 mL via INTRAVENOUS

## 2016-02-07 MED ORDER — DEXTROSE 50 % IV SOLN
INTRAVENOUS | Status: AC
  Filled 2016-02-07: qty 50

## 2016-02-07 MED ORDER — MAGNESIUM SULFATE IN D5W 1-5 GM/100ML-% IV SOLN
1.0000 g | Freq: Once | INTRAVENOUS | Status: AC
  Administered 2016-02-07: 1 g via INTRAVENOUS
  Filled 2016-02-07: qty 100

## 2016-02-07 MED ORDER — CHLORHEXIDINE GLUCONATE 0.12 % MT SOLN
15.0000 mL | Freq: Two times a day (BID) | OROMUCOSAL | Status: DC
  Administered 2016-02-07 – 2016-02-26 (×29): 15 mL via OROMUCOSAL
  Filled 2016-02-07 (×28): qty 15

## 2016-02-07 MED ORDER — M.V.I. ADULT IV INJ
INTRAVENOUS | Status: AC
  Administered 2016-02-07: 18:00:00 via INTRAVENOUS
  Filled 2016-02-07: qty 2000

## 2016-02-07 MED ORDER — FAT EMULSION 20 % IV EMUL
120.0000 mL | INTRAVENOUS | Status: AC
  Administered 2016-02-07: 120 mL via INTRAVENOUS
  Filled 2016-02-07: qty 200

## 2016-02-07 MED ORDER — ORAL CARE MOUTH RINSE
15.0000 mL | Freq: Two times a day (BID) | OROMUCOSAL | Status: DC
  Administered 2016-02-07 – 2016-02-24 (×16): 15 mL via OROMUCOSAL

## 2016-02-07 NOTE — Progress Notes (Signed)
This note also relates to the following rows which could not be included: BP - Cannot attach notes to unvalidated device data MAP (mmHg) - Cannot attach notes to unvalidated device data Pulse Rate - Cannot attach notes to unvalidated device data ECG Heart Rate - Cannot attach notes to unvalidated device data Resp - Cannot attach notes to unvalidated device data SpO2 - Cannot attach notes to unvalidated device data   

## 2016-02-07 NOTE — Progress Notes (Signed)
1 Day Post-Op  Subjective: Complains of pain  Objective: Vital signs in last 24 hours: Temp:  [97.7 F (36.5 C)-98.8 F (37.1 C)] 98.6 F (37 C) (12/24 0400) Pulse Rate:  [79-120] 114 (12/24 0600) Resp:  [18-30] 30 (12/24 0600) BP: (113-178)/(31-74) 113/42 (12/24 0600) SpO2:  [93 %-98 %] 96 % (12/24 0600) FiO2 (%):  [45 %] 45 % (12/23 1830) Last BM Date: 02/03/16  Intake/Output from previous day: 12/23 0701 - 12/24 0700 In: 4585 [I.V.:4200; Blood:335; IV Piggyback:50] Out: 1300 [Urine:1050; Drains:150; Blood:100] Intake/Output this shift: Total I/O In: 2110 [I.V.:2060; IV Piggyback:50] Out: 475 [Urine:475]  Resp: clear to auscultation bilaterally Cardio: regular rate and rhythm GI: appropriately tender. wound clean.  Lab Results:   Recent Labs  02/05/16 0554  WBC 9.4  HGB 7.6*  HCT 23.4*  PLT 471*   BMET No results for input(s): NA, K, CL, CO2, GLUCOSE, BUN, CREATININE, CALCIUM in the last 72 hours. PT/INR No results for input(s): LABPROT, INR in the last 72 hours. ABG No results for input(s): PHART, HCO3 in the last 72 hours.  Invalid input(s): PCO2, PO2  Studies/Results: Dg Small Bowel  Result Date: 02/05/2016 CLINICAL DATA:  Small bowel obstruction. New gastrojejunostomy tube placement. EXAM: SMALL BOWEL SERIES COMPARISON:  02/02/2016 TECHNIQUE: Small bowel series was performed during gradual injection of thin barium through the patient's existing gastrojejunostomy tube FLUOROSCOPY TIME:  Fluoroscopy Time: 0 minutes 44 seconds ; low dose pulsed fluoroscopy Radiation Exposure Index (if provided by the fluoroscopic device): 16.3 mGy Number of Acquired Spot Images: 0 FINDINGS: Scout radiograph shows percutaneous gastrojejunostomy tube in expected position. Mildly dilated small bowel loops are seen containing a small moderate residual oral contrast. Small bowel series was performed up to 3 hours post ingestion. This shows a complete small bowel obstruction  involving the proximal to mid jejunum in the central pelvis. No contrast leak or extravasation demonstrated. No contrast opacification of distal small bowel loops is seen. IMPRESSION: Complete small bowel obstruction involving the proximal to mid jejunum in the central pelvis. Consider continued follow-up with abdominal radiographs. Electronically Signed   By: Myles RosenthalJohn  Stahl M.D.   On: 02/05/2016 15:01   Dg Abd 2 Views  Result Date: 02/06/2016 CLINICAL DATA:  Followup small bowel obstruction. EXAM: ABDOMEN - 2 VIEW COMPARISON:  02/01/2016 and 02/05/2016 FINDINGS: Examination demonstrates a percutaneous jejunostomy tube in adequate position. Contrast is seen within the proximal small bowel with mild dilatation of these proximal to mid jejunal loops. Contrast does not pass the on the these mid jejunal loops in the lower midline abdomen which is unchanged from the prior small bowel series suggesting obstruction in this region. Minimal residual contrast over the right colon unchanged. No free peritoneal air. Remainder of the exam is unchanged. IMPRESSION: Contrast filled mildly dilated proximal to mid jejunal loops without significant change from the previous exam is contrast does not pass be on the mid jejunal loops in the lower midline abdomen suggesting complete obstruction. Percutaneous jejunostomy tube unchanged. Electronically Signed   By: Elberta Fortisaniel  Boyle M.D.   On: 02/06/2016 10:49    Anti-infectives: Anti-infectives    Start     Dose/Rate Route Frequency Ordered Stop   02/06/16 1415  piperacillin-tazobactam (ZOSYN) IVPB 3.375 g     3.375 g 100 mL/hr over 30 Minutes Intravenous  Once 02/06/16 1413 02/06/16 1409   01/26/16 2300  piperacillin-tazobactam (ZOSYN) IVPB 3.375 g  Status:  Discontinued     3.375 g 12.5 mL/hr over 240 Minutes Intravenous  Every 8 hours 01/26/16 2256 02/04/16 0905   01/26/16 2230  vancomycin (VANCOCIN) IVPB 1000 mg/200 mL premix  Status:  Discontinued     1,000 mg 200 mL/hr  over 60 Minutes Intravenous Every 12 hours 01/26/16 2227 01/26/16 2227   01/20/16 0400  vancomycin (VANCOCIN) IVPB 1000 mg/200 mL premix  Status:  Discontinued     1,000 mg 200 mL/hr over 60 Minutes Intravenous Every 12 hours 01/20/16 0323 01/21/16 0900   01/20/16 0330  ceFEPIme (MAXIPIME) 1 g in dextrose 5 % 50 mL IVPB     1 g 100 mL/hr over 30 Minutes Intravenous  Once 01/20/16 0317 01/20/16 0735      Assessment/Plan: s/p Procedure(s): EXPLORATORY LAPAROTOMY, LYSIS OF ADHESIONS, SEGMENTAL SMALL BOWEL RESECTION, GASTROJEJUNOSTOMY (N/A) G tube must be to foley drainage  Continue bowel rest and g tube to drain tpn for nutrition support Continue foley Trach stable. Off vent  LOS: 18 days    TOTH III,Rhianon Zabawa S 02/07/2016

## 2016-02-07 NOTE — Progress Notes (Signed)
Hypoglycemic Event  CBG: 51  Treatment: D50 IV 50 mL  Symptoms: Nervous/irritable  Follow-up CBG: Time:08:53 CBG Result:66  Possible Reasons for Event: Medication regimen: TPN  Comments/MD notified: additional D50 given, will recheck    Arlyss RepressShaffer, Mitchell HeirShae Lee Nicole

## 2016-02-07 NOTE — Progress Notes (Signed)
Hypoglycemic Event  CBG: 66  Treatment: D50 IV 50 mL  Symptoms: None  Follow-up CBG: Time:09:21 CBG Result:134  Possible Reasons for Event: Medication regimen: TPN  Comments/MD notified:    [name], Leray Garverick Lee Nicole

## 2016-02-07 NOTE — Progress Notes (Signed)
[name]PH[facility]ACY - ADULT TOTAL PARENTERAL NUTRITION CONSULT NOTE   Pharmacy Consult for TPN Indication: PTA TPN continuation, intolerance to enteral feed  Insulin Requirements: 10 units from midnight to 0600 this morning from Novolog sensitive SSI q6h (with 10 units of insulin in TPN bag)  Current Nutrition:  - Clinimix as below  - Tube feeds stopped for nausea 12/19 [facility]: [facility] 405 grams (1376 kcal)  of dextrose, 75 grams (300 kcal) of protein and 50 grams ( 480 kcal) of lipid for a total of 2156 kcal/day  IVF: NS at 80 ml/hr 6a to 6P when TPN off  Central access: PICC TPN start date: PTA, restarted on admission on 12/6   ASSESSMENT                                                                                                          HPI: [age] yoM with hx PUD, CAD, GERD, BPH,  s/p multiple procedures  and extended hospitalization for recurrent partial GOO 2/2 pyloric stricture on TPN for several days, discharged 11/15 with J/G tube in place on TFs, unable to tolerate and had [facility], readmitted 12/6 with partial SBO. Pharmacy consulted to resume TPN.  Significant events:  12/6: TPN continued on admission. [facility] 12/7: Pt pulled out NGT, not replaced, remains NPO 12/8: trial of ice chips 12/10: continue TPN, plan swallow study Monday 12/11, if no aspiration, will start to advance diet.   12/11: Awaiting MBS.  Having bowel function.  Continue TPN for now.   12/12: Severe aspiration risk per MBS yesterday - recommend continuing NPO. Osmolite 1.2 CAL via TF BID ordered 12/11 at 6pm. Per discussion with CCS PA, continue TPN at current goal rate today. F/u for ability to wean tomorrow if tolerates TF. 12/13: Did not tolerate bolus feeds through G-tube; overnight emesis and aspiration event, started on Zosyn. CCS trying trickle feeds today. 12/14 transition to cyclic TPN - appears recovery may  be prolonged. Not tolerating trickle feeds; G-tube to drainage.  12/15: TPN was not run at cyclic rate overnight d/t conflicting information in TPN order. To provide patient full needed nutrition, will continue to run TPN at 83 ml/hr over 24 hr and begin 18-hr cycle with tonight's TPN 12/16: start 12h TPN cycle 12/18: resume trickle tube feed at 5 ml/hr 12/19: Nausea/vomiting, tube feed held. TPN formula to E 5/20 (more dextrose than E 5/15 formulation previously used) 12/21: successful transition of G tube to GJ tube. Plan scan for patency before resuming tube feeds.  12/22: Upper GI scan in IR today to ensure J-tube working.  RD recommendations for TFs noted.   12/23: exp lap, lysis of adhesions, segmental small bowel resection, gastrojejunostomy  Today, 02/07/2016:   Glucose (goal <150):  No hx DM; two cbgs reading in 200s while on TPN, one hypoglycemic event this morning after TPN bag d/ced  Electrolytes: phos, K+ and CorrCa wnl, Na slightly low, Mag slightly low 1.6  Renal - SCr WNL, stable  LFTs:  AST/ALT, AlkPhos WNL  TGs: 177 ( 11/27), 93 (12/7), 119 (12/11) 148 (12/18)  Prealbumin:  15.3 ( 11/13), 10.8 (12/7), 12.8 (12/11) 13.1 (12/18)  NUTRITIONAL GOALS                                                                                             RD recs (12/7): Kcal 2000-2200, Protein 100-115g, Fluid 2L/day  [facility] T[facility]: 2156 Kcal/day, Protein 75g/day  - Clinimix E 5/15 at a goal rate of 90 ml/hr + 20% fat emulsion at  10 ml/hr to provide: 108 g/day protein, 2013 Kcal/day. - Clinimix E 5/20 2L on cyclic regimen + 120ml of 20% Fat emulsion to provide 100gm protein, 2060 kCal/day   +++There is currently a national backorder of Clinimix solution.  Pharmacy will use Clinimix product based on what we have available in stock+++   PLAN                                                                                                                          Now:  - magnesium sulfate  1gm IV x1  At 1800 today: Continue 12-hr TPN cycle  Increase to 15 units novolog in TPN bag  Continue with Clinimix E 5/20, 2L given over 12h cycle as follows:   1800 tonight: 2850ml/hr x 1 hr  1900 tonight: 190 ml/hr x 10h  0500 tomorrow (12/17): reduce rate to 50 ml/hr x 1 hr, then off  20% fat emulsion at 10 ml/hr x 12 hr - to deliver 240 kCal/day  TPN to contain standard multivitamins and trace elements.  CBG coverage per cyclic rates  F/u plan for resuming tube feeds   TPN lab panels on Mondays & Thursdays,   Continue NS at 80 ml/hr x 12 h (6a - 6p) while TPN off  F/u daily  Dorna LeitzAnh Aundrey Elahi, PharmD, BCPS 02/07/2016 7:55 AM

## 2016-02-07 NOTE — Progress Notes (Signed)
[facility]  Subjective:   [name] underwent exploratory laparotomy today. Immediately post-operatively there was concern he would require prolonged ventilator support through his tracheostomy. He did well once he came back to his hospital room and tolerated TC overnight without any difficulty.   Objective: Vitals:   02/07/16 0800 02/07/16 0815 02/07/16 0900 02/07/16 1000  BP:  (!) 95/49 (!) 94/43 (!) 111/35  Pulse: (!) 109 97 92 (!) 103  Resp: (!) 27 (!) 25 (!) 24 19  Temp:      TempSrc:      SpO2: 94% 96% 95% 96%  Weight:      Height:       Exam: Awake, alert, resting quietly in bed, no distress Tracheostomy in place, no PMV, increased secretions noted Lungs with transmitted upper airway sounds. Cardiovascular regular rate and rhythm no murmurs gallops or rubs Neurologic: No distress, spontaneous movement of ext's  Abdomen: Midline wound dressed, feeding tube present MSK: 2 + pitting edema of legs   Labs and Radiology reviewed in EPIC by me, trach in good place  Impression:  Tracheostomy status Small bowel obstruction Recurrent aspiration  Severe protein calorie malnutrition Left pleural effusion   Discussion:  This is a 75 year old male who is trach/PEG and bed bound in the setting of recurrent SBO s/p multiple surgical interventions. He is tolerating his trach collar well and has no respiratory complaints currently.   Tracheostomy, mild hypoxic respiratory failure  - Maintain trach in place for now  - Would consider decannulation when patient is improved from a physical conditioning standpoint  Hypoxemia:  - Titrate O2 for sat of 88-92%  - Maintain on TC as tolerated.  - Gentle diureses today  Left pleural effusion  - Gentle diureses today  - F/U imaging  Physical deconditioning:  - PT/OT  - OOB when ok with surgery  Discussed with RT.  PCCM will be available PRN  Alyson ReedyWesam G. Yacoub, M.D. [facility]  [facility]. Pager: [phone]. After hours pager: [phone].

## 2016-02-07 NOTE — Progress Notes (Signed)
Pt had episode of nausea, he is having increase in sputum requiring frequent suctioning. Respiratory worked with trach, patient is able to clear with cough. Pt having short term memory loss, he is unable to remember he has cath for urine continues to ask for urinal.

## 2016-02-08 LAB — CBC
HEMATOCRIT: 25.5 % — AB (ref 39.0–52.0)
Hemoglobin: 8.5 g/dL — ABNORMAL LOW (ref 13.0–17.0)
MCH: 30.2 pg (ref 26.0–34.0)
MCHC: 33.3 g/dL (ref 30.0–36.0)
MCV: 90.7 fL (ref 78.0–100.0)
PLATELETS: 381 10*3/uL (ref 150–400)
RBC: 2.81 MIL/uL — ABNORMAL LOW (ref 4.22–5.81)
RDW: 18 % — AB (ref 11.5–15.5)
WBC: 32 10*3/uL — AB (ref 4.0–10.5)

## 2016-02-08 LAB — COMPREHENSIVE METABOLIC PANEL
ALBUMIN: 1.3 g/dL — AB (ref 3.5–5.0)
ALK PHOS: 114 U/L (ref 38–126)
ALT: 23 U/L (ref 17–63)
ANION GAP: 5 (ref 5–15)
AST: 21 U/L (ref 15–41)
BILIRUBIN TOTAL: 0.4 mg/dL (ref 0.3–1.2)
BUN: 54 mg/dL — AB (ref 6–20)
CALCIUM: 7.8 mg/dL — AB (ref 8.9–10.3)
CO2: 21 mmol/L — AB (ref 22–32)
CREATININE: 1 mg/dL (ref 0.61–1.24)
Chloride: 107 mmol/L (ref 101–111)
GFR calc Af Amer: >60 mL/min (ref >60)
GFR calc non Af Amer: >60 mL/min (ref >60)
GLUCOSE: 174 mg/dL — AB (ref 65–99)
Potassium: 4.8 mmol/L (ref 3.5–5.1)
SODIUM: 133 mmol/L — AB (ref 135–145)
TOTAL PROTEIN: 4.8 g/dL — AB (ref 6.5–8.1)

## 2016-02-08 LAB — DIFFERENTIAL
BASOS PCT: 0 %
Basophils Absolute: 0 10*3/uL (ref 0.0–0.1)
EOS ABS: 0 10*3/uL (ref 0.0–0.7)
EOS PCT: 0 %
LYMPHS ABS: 3.5 10*3/uL (ref 0.7–4.0)
Lymphocytes Relative: 11 %
MONO ABS: 2.6 10*3/uL — AB (ref 0.1–1.0)
Monocytes Relative: 8 %
Neutro Abs: 25.9 10*3/uL — ABNORMAL HIGH (ref 1.7–7.7)
Neutrophils Relative %: 81 %

## 2016-02-08 LAB — PREALBUMIN: Prealbumin: 8.9 mg/dL — ABNORMAL LOW (ref 18–38)

## 2016-02-08 LAB — GLUCOSE, CAPILLARY
GLUCOSE-CAPILLARY: 122 mg/dL — AB (ref 65–99)
GLUCOSE-CAPILLARY: 147 mg/dL — AB (ref 65–99)
GLUCOSE-CAPILLARY: 186 mg/dL — AB (ref 65–99)
Glucose-Capillary: 107 mg/dL — ABNORMAL HIGH (ref 65–99)
Glucose-Capillary: 107 mg/dL — ABNORMAL HIGH (ref 65–99)
Glucose-Capillary: 121 mg/dL — ABNORMAL HIGH (ref 65–99)

## 2016-02-08 LAB — PHOSPHORUS: Phosphorus: 4.4 mg/dL (ref 2.5–4.6)

## 2016-02-08 LAB — TRIGLYCERIDES: Triglycerides: 134 mg/dL (ref <150)

## 2016-02-08 LAB — MAGNESIUM: Magnesium: 1.9 mg/dL (ref 1.7–2.4)

## 2016-02-08 MED ORDER — TRACE MINERALS CR-CU-MN-SE-ZN 10-1000-500-60 MCG/ML IV SOLN
INTRAVENOUS | Status: AC
  Administered 2016-02-08: 18:00:00 via INTRAVENOUS
  Filled 2016-02-08 (×2): qty 2000

## 2016-02-08 MED ORDER — FAT EMULSION 20 % IV EMUL
240.0000 mL | INTRAVENOUS | Status: AC
  Administered 2016-02-08: 240 mL via INTRAVENOUS
  Filled 2016-02-08 (×2): qty 250

## 2016-02-08 MED ORDER — VANCOMYCIN HCL IN DEXTROSE 1-5 GM/200ML-% IV SOLN: 1000.0000 mg | Freq: Two times a day (BID) | INTRAVENOUS | Status: DC

## 2016-02-08 MED ORDER — VANCOMYCIN HCL IN DEXTROSE 1-5 GM/200ML-% IV SOLN
1000.0000 mg | Freq: Two times a day (BID) | INTRAVENOUS | Status: DC
  Administered 2016-02-09 – 2016-02-12 (×7): 1000 mg via INTRAVENOUS
  Filled 2016-02-08 (×7): qty 200

## 2016-02-08 MED ORDER — PIPERACILLIN-TAZOBACTAM 3.375 G IVPB
3.3750 g | Freq: Three times a day (TID) | INTRAVENOUS | Status: DC
  Administered 2016-02-08 – 2016-02-17 (×27): 3.375 g via INTRAVENOUS
  Filled 2016-02-08 (×28): qty 50

## 2016-02-08 MED ORDER — INSULIN ASPART 100 UNIT/ML ~~LOC~~ SOLN: 0.0000 [IU] | SUBCUTANEOUS | Status: DC

## 2016-02-08 MED ORDER — VANCOMYCIN HCL IN DEXTROSE 1-5 GM/200ML-% IV SOLN
1000.0000 mg | Freq: Once | INTRAVENOUS | Status: AC
  Administered 2016-02-08: 1000 mg via INTRAVENOUS
  Filled 2016-02-08: qty 200

## 2016-02-08 MED ORDER — INSULIN ASPART 100 UNIT/ML ~~LOC~~ SOLN
0.0000 [IU] | Freq: Four times a day (QID) | SUBCUTANEOUS | Status: DC
  Administered 2016-02-09: 1 [IU] via SUBCUTANEOUS
  Administered 2016-02-09: 2 [IU] via SUBCUTANEOUS
  Administered 2016-02-09 (×2): 1 [IU] via SUBCUTANEOUS
  Administered 2016-02-09: 2 [IU] via SUBCUTANEOUS
  Administered 2016-02-10 – 2016-02-18 (×13): 1 [IU] via SUBCUTANEOUS

## 2016-02-08 NOTE — Progress Notes (Signed)
Stopped TPN per order, notified IV team and advised to discard.

## 2016-02-08 NOTE — Progress Notes (Addendum)
[facility] Office:  [phone] General Surgery Progress Note   LOS: 19 days  POD -  2 Days Post-Op  Assessment/Plan: 1.  EXPLORATORY LAPAROTOMY, LYSIS OF ADHESIONS, SEGMENTAL SMALL BOWEL RESECTION, GASTROJEJUNOSTOMY - 02/06/2016 Danny Edouard- Toth  (previous V&A with BI anastomosis - 09/25/2015 - Gross)  WBC - 32,000 - 02/08/2016  Looks okay - no specific complaint  Start dressing changes.  Discussed with Dr. Carolynne Edouardoth - not sure of the cause for the significant leukocytosis, but will start Zosyn   2.  Malnutrition - on TPN  Prealbumin 13.1 - 02/01/2016  He is on cyclic TPN, but drops his blood sugars during the day.  Will go back to continuous TPN.  Discussed with [name]. 3.  Anemia  Hgb - 8.5 - 02/08/2016 4.  Trach 5.  DVT prophylaxis - SQ heparin 6.  Has foley.   Principal Problem:   Aspiration pneumonia (HCC) Active Problems:   Essential hypertension, benign   Dystrophy, Salzmann's nodular   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 09/25/2015   Anxiety state   Protein-calorie malnutrition, severe   Gastric tube present (LUQ)   Tracheostomy in place [facility](HCC)   On total parenteral nutrition (TPN)   Skin ulcer of abdominal wall, limited to breakdown of skin (HCC)   Dysphagia   Generalized weakness   Insomnia   SBO (small bowel obstruction)   Pleural effusion   Pressure injury of skin   Hypomagnesemia   Hypokalemia   Anemia of chronic disease   Stage 2 skin ulcer of sacral region  Subjective:  No specific complaint.  Pain control good.  Objective:   Vitals:   02/08/16 0500 02/08/16 0600  BP: (!) 160/70 (!) 100/36  Pulse: (!) 132 (!) 107  Resp: (!) 25 (!) 21  Temp:       Intake/Output from previous day:  12/24 0701 - 12/25 0700 In: 3247.5 [I.V.:2987.5; IV Piggyback:260] Out: 2060 [Urine:1075; Drains:985]  Intake/Output this shift:  No intake/output data recorded.   Physical Exam:   General: Older WM who is alert and oriented.    He is weak.   HEENT: Normal. Pupils equal.  Has trach .   Lungs: Clear.   Abdomen: Soft, but quiet.   Wound: Clean.  GJ tube in LUQ.   Lab Results:    Recent Labs  02/08/16 0425  WBC 32.0*  HGB 8.5*  HCT 25.5*  PLT 381    BMET   Recent Labs  02/07/16 1001 02/08/16 0425  NA 133* 133*  K 4.7 4.8  CL 106 107  CO2 23 21*  GLUCOSE 88 174*  BUN 44* 54*  CREATININE 1.03 1.00  CALCIUM 7.6* 7.8*    PT/INR  No results for input(s): LABPROT, INR in the last 72 hours.  ABG  No results for input(s): PHART, HCO3 in the last 72 hours.  Invalid input(s): PCO2, PO2   Studies/Results:  Dg Abd 2 Views  Result Date: 02/06/2016 CLINICAL DATA:  Followup small bowel obstruction. EXAM: ABDOMEN - 2 VIEW COMPARISON:  02/01/2016 and 02/05/2016 FINDINGS: Examination demonstrates a percutaneous jejunostomy tube in adequate position. Contrast is seen within the proximal small bowel with mild dilatation of these proximal to mid jejunal loops. Contrast does not pass the on the these mid jejunal loops in the lower midline abdomen which is unchanged from the prior small bowel series suggesting obstruction in this region. Minimal residual contrast over the right colon unchanged. No free peritoneal air. Remainder of the exam  is unchanged. IMPRESSION: Contrast filled mildly dilated proximal to mid jejunal loops without significant change from the previous exam is contrast does not pass be on the mid jejunal loops in the lower midline abdomen suggesting complete obstruction. Percutaneous jejunostomy tube unchanged. Electronically Signed   By: Elberta Fortisaniel  Boyle M.D.   On: 02/06/2016 10:49     Anti-infectives:   Anti-infectives    Start     Dose/Rate Route Frequency Ordered Stop   02/06/16 1415  piperacillin-tazobactam (ZOSYN) IVPB 3.375 g     3.375 g 100 mL/hr over 30 Minutes Intravenous  Once 02/06/16 1413 02/06/16 1409   01/26/16 2300  piperacillin-tazobactam (ZOSYN) IVPB 3.375 g  Status:  Discontinued      3.375 g 12.5 mL/hr over 240 Minutes Intravenous Every 8 hours 01/26/16 2256 02/04/16 0905   01/26/16 2230  vancomycin (VANCOCIN) IVPB 1000 mg/200 mL premix  Status:  Discontinued     1,000 mg 200 mL/hr over 60 Minutes Intravenous Every 12 hours 01/26/16 2227 01/26/16 2227   01/20/16 0400  vancomycin (VANCOCIN) IVPB 1000 mg/200 mL premix  Status:  Discontinued     1,000 mg 200 mL/hr over 60 Minutes Intravenous Every 12 hours 01/20/16 0323 01/21/16 0900   01/20/16 0330  ceFEPIme (MAXIPIME) 1 g in dextrose 5 % 50 mL IVPB     1 g 100 mL/hr over 30 Minutes Intravenous  Once 01/20/16 0317 01/20/16 0735      Ovidio Kinavid Dayle Sherpa, MD, FACS Pager: [phone] [facility] Office: [phone] 02/08/2016

## 2016-02-08 NOTE — Progress Notes (Addendum)
[name]PH[facility]Y - ADULT TOTAL PARENTERAL NUTRITION CONSULT NOTE   Pharmacy Consult for TPN Indication: PTA TPN continuation, intolerance to enteral feed  Insulin Requirements: received 4 units Novolog sensitive SSI coverage q6h  from midnight to 0600 during "on" cycle; also receives regular insulin 15 units / 12 hr in cyclic TPN bag  Current Nutrition:  - Clinimix as below  - Tube feeds stopped due to nausea 12/19, remains off  IVF: NS at 80 ml/hr 6a to 6P when TPN off  Central access: PICC TPN start date: PTA, restarted on admission on 12/6    Recent Labs  02/07/16 1001 02/08/16 0425  NA 133* 133*  K 4.7 4.8  CL 106 107  CO2 23 21*  GLUCOSE 88 174*  BUN 44* 54*  CREATININE 1.03 1.00  CALCIUM 7.6* 7.8*  PHOS 3.8 4.4  MG 1.6* 1.9  ALBUMIN 1.3* 1.3*  ALKPHOS 118 114  AST 26 21  ALT 30 23  BILITOT 0.3 0.4  PREALBUMIN  --  8.9*  Corr Ca: 10 on 12/25  ASSESSMENT                                                                                                          HPI: [age] yoM with hx PUD, CAD, GERD, BPH,  s/p multiple procedures  and extended hospitalization for recurrent partial GOO 2/2 pyloric stricture on TPN for several days, discharged 11/15 with J/G tube in place on TFs, unable to tolerate and [facility], readmitted 12/6 with partial SBO. Pharmacy consulted to resume TPN. Note: PTA TNA from [facility] was as follows: Totals per day: 405 grams (1376 kcal)  of dextrose, 75 grams (300 kcal) of protein and 50 grams ( 480 kcal) of lipid for a total of 2156 kcal/day   Significant events:  12/6: TPN continued on admission. [facility]/7: Pt pulled out NGT, not replaced, remains NPO 12/8: trial of ice chips 12/10: continue TPN, plan swallow study Monday 12/11, if no aspiration, will start to advance diet.   12/11: Awaiting MBS.  Having bowel function.  Continue TPN for now.   12/12: Severe aspiration risk per MBS  yesterday - recommend continuing NPO. Osmolite 1.2 CAL via TF BID ordered 12/11 at 6pm. Per discussion with CCS PA, continue TPN at current goal rate today. F/u for ability to wean tomorrow if tolerates TF. 12/13: Did not tolerate bolus feeds through G-tube; overnight emesis and aspiration event, started on Zosyn. CCS trying trickle feeds today. 12/14 transition to cyclic TPN - appears recovery may be prolonged. Not tolerating trickle feeds; G-tube to drainage.  12/15: TPN was not run at cyclic rate overnight d/t conflicting information in TPN order. To provide patient full needed nutrition, will continue to run TPN at 83 ml/hr over 24 hr and begin 18-hr cycle with tonight's TPN 12/16: start 12h TPN cycle 12/18: resume trickle tube feed at 5 ml/hr 12/19: Nausea/vomiting, tube feed held. TPN formula to E 5/20 (more dextrose than E 5/15 formulation  previously used) 12/21: successful transition of G tube to GJ tube. Plan scan for patency before resuming tube feeds.  12/22: Upper GI scan in IR today to ensure J-tube working.  RD recommendations for TFs noted.   12/23: exp lap, lysis of adhesions, segmental small bowel resection, gastrojejunostomy 12/25: TPN converted from cyclic back to continuous for now  Today, 02/08/2016:   Glucose (goal 100-150):  No hx DM; CBGs > 150 during "on" cycle (172, 186).   Had a hypoglycemic event yesterday morning (CBG 66) after cyclic TPN turned off.  Electrolytes: WNL except Na and TCO2 slightly low  Renal - SCr WNL, stable.  BUN elevated.  LFTs:  All below ULN   TGs: 177 ( 11/27), 93 (12/7), 119 (12/11) 148 (12/18); today's value pending  Prealbumin:  15.3 ( 11/13), 10.8 (12/7), 12.8 (12/11), 13.1 (12/18), 8.9 (12/25) - falling  NUTRITIONAL GOALS                                                                                             RD recs (12/7): Kcal 2000-2200, Protein 100-115g, Fluid 2L/day  [facility] TPN: 2156 Kcal/day, Protein 75g/day  - Clinimix E  5/15 at a goal rate of 90 ml/hr + 20% fat emulsion at  10 ml/hr provides: 108 g/day protein, 2013 Kcal/day. - Cyclic regimen of Clinimix E 5/20 2L + 120ml of 20% Fat emulsion over 12 hours  provides 100 g protein/d, 2000 kCal/day - Continuous regimen of Clinimix-E 5/20 at 83 mL/hr + Fat Emulsion 20% at 10 mL/hr provides 100 g protein / day and 2240 KCal / day ---------------------------------------------------------------------------------------------------- (+++There is currently a Acupunctur[employer]national backorder of Clinimix solution.  Pharmacy will use Clinimix product based on what we have available in stock+++)   PLAN                                                                                                                         At 1800 today:   Because of recent hypoglycemic event while cyclic TPN off and because patient is likely to remain hospitalized for at least several more days, will switch from cyclic TPN back to continuous - discussed with Dr. Ezzard StandingNewman  Given falling prealbumin, will:  Increase Fat Emulsion 20% to 10 mL/hr  Continue Clinimix-E 5/20 at 83 mL/hr (utilizes entire 2 L bag)  New regimen provides 100 g protein/day, 2240 KCal /day (12% increase over current caloric provision)  Continue 15 units regular insulin per TPN bag  TPN to contain standard multivitamins and trace elements.  Change timing of CBGs to q6h   BMet tomorrow  Routine TPN  labs on Mondays and Thursdays  F/u plans for timing of next trial of tube feeds   Elie Goody, PharmD, BCPS Pager: [phone] 02/08/2016  9:08 AM

## 2016-02-08 NOTE — Progress Notes (Addendum)
Pharmacy Antibiotic Note  [name] is a 75 y.o. male well-known to Pharmacy who is s/p multiple procedures and an extended hospitalization for recurrent partial GOO, readmitted on 01/19/2016 with partial SBO and possible aspiration PNA.  Pharmacy has been consulted to resume Zosyn dosing as empiric coverage with new leukocytosis.  Plan:  Zosyn 3.375gm IV q8h (4hr extended infusions)  No further dosage adjustments anticipated, will sign-off  Height: 6\' 2"  (188 cm) Weight: 189 lb 13.1 oz (86.1 kg) IBW/kg (Calculated) : 82.2  Temp (24hrs), Avg:98.2 F (36.8 C), Min:97.4 F (36.3 C), Max:99 F (37.2 C)   Recent Labs Lab 02/03/16 0422 02/04/16 0438 02/05/16 0554 02/07/16 1001 02/08/16 0425  WBC  --  8.2 9.4  --  32.0*  CREATININE 0.83 0.78  --  1.03 1.00    Estimated Creatinine Clearance: 73.1 mL/min (by C-G formula based on SCr of 1 mg/dL).    Allergies  Allergen Reactions  . Flomax [Tamsulosin Hcl] Other (See Comments)    Leg weakness  . Lisinopril Cough  . Lorazepam     "i don't like it"  Prefers Xanax or valium  . Uroxatral [Alfuzosin Hcl Er] Other (See Comments)    Leg weakness    Antimicrobials this admission:  12/6 cefepime >> 12/6 12/6 vancomycin >> 12/7 12/12 zosyn >> 12/21 >> resume 12/25 >>  Microbiology results: 12/7 influenza A/B negative  12/23 MRSA PCR (-)  Thank you for allowing pharmacy to be a part of this patient's care.  Loralee PacasErin Khiley Lieser, PharmD, BCPS Pager: [phone] 02/08/2016 11:38 AM   Addendum: Now consulted to add vancomycin.    Plan: - Vancomycin 1g IV q12h - Check trough at steady state  Loralee PacasErin Denario Bagot, PharmD, BCPS 02/08/2016 12:58 PM

## 2016-02-09 ENCOUNTER — Encounter (HOSPITAL_COMMUNITY): Payer: Self-pay | Admitting: General Surgery

## 2016-02-09 LAB — GLUCOSE, CAPILLARY
GLUCOSE-CAPILLARY: 152 mg/dL — AB (ref 65–99)
GLUCOSE-CAPILLARY: 130 mg/dL — AB (ref 65–99)
Glucose-Capillary: 156 mg/dL — ABNORMAL HIGH (ref 65–99)
Glucose-Capillary: 127 mg/dL — ABNORMAL HIGH (ref 65–99)

## 2016-02-09 LAB — BASIC METABOLIC PANEL
ANION GAP: 4 — AB (ref 5–15)
BUN: 42 mg/dL — ABNORMAL HIGH (ref 6–20)
CALCIUM: 7.9 mg/dL — AB (ref 8.9–10.3)
CO2: 23 mmol/L (ref 22–32)
CREATININE: 0.87 mg/dL (ref 0.61–1.24)
Chloride: 106 mmol/L (ref 101–111)
GLUCOSE: 158 mg/dL — AB (ref 65–99)
Potassium: 4 mmol/L (ref 3.5–5.1)
Sodium: 133 mmol/L — ABNORMAL LOW (ref 135–145)

## 2016-02-09 LAB — CBC WITH DIFFERENTIAL/PLATELET
BASOS ABS: 0 10*3/uL (ref 0.0–0.1)
BASOS PCT: 0 %
EOS PCT: 0 %
Eosinophils Absolute: 0 10*3/uL (ref 0.0–0.7)
HCT: 23.9 % — ABNORMAL LOW (ref 39.0–52.0)
Hemoglobin: 8 g/dL — ABNORMAL LOW (ref 13.0–17.0)
Lymphocytes Relative: 14 %
Lymphs Abs: 3.1 10*3/uL (ref 0.7–4.0)
MCH: 30.4 pg (ref 26.0–34.0)
MCHC: 33.5 g/dL (ref 30.0–36.0)
MCV: 90.9 fL (ref 78.0–100.0)
MONO ABS: 1.6 10*3/uL — AB (ref 0.1–1.0)
Monocytes Relative: 7 %
Neutro Abs: 16.9 10*3/uL — ABNORMAL HIGH (ref 1.7–7.7)
Neutrophils Relative %: 79 %
PLATELETS: 447 10*3/uL — AB (ref 150–400)
RBC: 2.63 MIL/uL — ABNORMAL LOW (ref 4.22–5.81)
RDW: 17.8 % — AB (ref 11.5–15.5)
WBC: 21.6 10*3/uL — ABNORMAL HIGH (ref 4.0–10.5)

## 2016-02-09 MED ORDER — TRACE MINERALS CR-CU-MN-SE-ZN 10-1000-500-60 MCG/ML IV SOLN
INTRAVENOUS | Status: AC
  Administered 2016-02-09: 17:00:00 via INTRAVENOUS
  Filled 2016-02-09 (×2): qty 2000

## 2016-02-09 MED ORDER — FAT EMULSION 20 % IV EMUL
240.0000 mL | INTRAVENOUS | Status: AC
  Administered 2016-02-09: 240 mL via INTRAVENOUS
  Filled 2016-02-09: qty 240
  Filled 2016-02-09: qty 250

## 2016-02-09 NOTE — Progress Notes (Signed)
Patient is not motivated to get out of bed. Persuaded patient to get in the chair. Patient is mostly dependant on staff for movement from bed to chair. Patient did not tolerate standing and moving to chair well. Patient is severely weak and fatigued. Will continue to monitor patient.

## 2016-02-09 NOTE — Progress Notes (Signed)
Nutrition Follow-up  DOCUMENTATION CODES:   Severe malnutrition in context of acute illness/injury  INTERVENTION:   TPN per Pharmacy  Tube Feeding Recommendations: Initiate Vital 1.5 @ 10 ml/hr via J-tube and increase by 10 ml every 24 hours to goal rate of 60 ml/hr.  Tube feeding regimen provides 2160 kcal (100% of needs), 97 grams of protein, and 1100 ml of H2O.   RD will continue to monitor for plan.  NUTRITION DIAGNOSIS:   Inadequate oral intake related to inability to eat as evidenced by NPO status.  Ongoing.  GOAL:   Patient will meet greater than or equal to 90% of their needs  Meeting with TPN.  MONITOR:   Labs, Weight trends, Skin, I & O's, Other (Comment) (TPN, TF initiation?)  ASSESSMENT:   [age] year-old male with a medical history of GERD, PUD, and CAD who presented to [facility] via EMS with a cc of nausea, vomiting, and diarrhea. Patient is s/p Robotic anterior and posterior vagotomy, BI anastomosis, DOR fundoplication, omentopexy 09/2015 for partial GOO secondary to pyloric stricture, followed by placement of feeding jejunostomy and gastrostomy tube and EGD balloon dilation of duodenal stricture 12/01/15 for recurrent GOO secondary to edema at anastomosis and failure to thrive with malnutrition. On 12/03/15 the patient was taken to the OR for a diagnostic laparoscopy, omentopexy of jejunal disruption, and washout for jejunal disruption (pt pulled out jejunostomy tube) and gastric leak.  12/23: S/P EXPLORATORY LAPAROTOMY, LYSIS OF ADHESIONS, SEGMENTAL SMALL BOWEL RESECTION, GASTROJEJUNOSTOMY   Per chart review, TPN will be converted back to continuous TPN infusion d/t hypoglycemic events. Clinimix-E 5/20 at 83 mL/hr + Fat Emulsion 20% at 10 mL/hr provides 100 g protein / day and 2240 KCal / day  Will monitor for TF trial via J-tube.  Labs reviewed: CBGs: 152-156 Low Na Medications reviewed.  Plan per Pharmacy 12/26: At  1800 today:   Continue Clinimix-E 5/20 at 83 mL/hr (utilizes entire 2 L bag)  Continue lipid emulsion 20% at 10 ml/hr (increased yesterday due to falling prealbumin)   Diet Order:  TPN (CLINIMIX-E) Adult TPN (CLINIMIX-E) Adult  Skin:     Last BM:  12/20  Height:   Ht Readings from Last 1 Encounters:  01/21/16 6\' 2"  (1.88 m)    Weight:   Wt Readings from Last 1 Encounters:  02/09/16 191 lb 12.8 oz (87 kg)    Ideal Body Weight:  86.4 kg  BMI:  Body mass index is 24.63 kg/m.  Estimated Nutritional Needs:   Kcal:  2000-2200  Protein:  100-115g  Fluid:  2L/day  EDUCATION NEEDS:   No education needs identified at this time  Tilda FrancoLindsey Culley Hedeen, MS, RD, LDN Pager: [phone] After Hours Pager: [phone]

## 2016-02-09 NOTE — Progress Notes (Signed)
[name].PHARMACY - ADULT TOTAL PARENTERAL NUTRITION CONSULT NOTE   Pharmacy Consult for TPN Indication: PTA TPN continuation, intolerance to enteral feed  Insulin Requirements: received 3 units Novolog sensitive SSI coverage q6h since resuming continuous TPN last evening; also receives regular insulin 15 units per TPN bag  Current Nutrition:  - Clinimix as below  - Tube feeds stopped due to nausea 12/19, remains off  IVF: off  Central access: PICC TPN start date: PTA, restarted on admission on 12/6    Recent Labs  02/07/16 1001 02/08/16 0425 02/09/16 0429  NA 133* 133* 133*  K 4.7 4.8 4.0  CL 106 107 106  CO2 23 21* 23  GLUCOSE 88 174* 158*  BUN 44* 54* 42*  CREATININE 1.03 1.00 0.87  CALCIUM 7.6* 7.8* 7.9*  PHOS 3.8 4.4  --   MG 1.6* 1.9  --   ALBUMIN 1.3* 1.3*  --   ALKPHOS 118 114  --   AST 26 21  --   ALT 30 23  --   BILITOT 0.3 0.4  --   TRIG  --  134  --   PREALBUMIN  --  8.9*  --   Corr Ca: 10 on 12/25  ASSESSMENT                                                                                                          HPI: [age] yoM with hx PUD, CAD, GERD, BPH,  s/p multiple procedures  and extended hospitalization for recurrent partial GOO 2/2 pyloric stricture on TPN for several days, discharged 11/15 with J/G tube in place on TFs, unable to tolerate and [facility], readmitted 12/6 with partial SBO. Pharmacy consulted to resume TPN. Note: PTA TNA from [facility] was as follows: Totals per day: 405 grams (1376 kcal)  of dextrose, 75 grams (300 kcal) of protein and 50 grams ( 480 kcal) of lipid for a total of 2156 kcal/day   Significant events:  12/6: TPN continued on admission. [facility] 12/7: Pt pulled out NGT, not replaced, remains NPO 12/8: trial of ice chips 12/10: continue TPN, plan swallow study Monday 12/11, if no aspiration, will start to advance diet.   12/11: Awaiting MBS.  Having bowel function.   Continue TPN for now.   12/12: Severe aspiration risk per MBS yesterday - recommend continuing NPO. Osmolite 1.2 CAL via TF BID ordered 12/11 at 6pm. Per discussion with [facility] PA, continue TPN at current goal rate today. F/u for ability to wean tomorrow if tolerates TF. 12/13: Did not tolerate bolus feeds through G-tube; overnight emesis and aspiration event, started on Zosyn. [facility] trying trickle feeds today. 12/14 transition to cyclic TPN - appears recovery may be prolonged. Not tolerating trickle feeds; G-tube to drainage.  12/15: TPN was not run at cyclic rate overnight d/t conflicting information in TPN order. To provide patient full needed nutrition, will continue to run TPN at 83 ml/hr over 24 hr and begin 18-hr cycle with tonight's TPN 12/16:  start 12h TPN cycle 12/18: resume trickle tube feed at 5 ml/hr 12/19: Nausea/vomiting, tube feed held. TPN formula to E 5/20 (more dextrose than E 5/15 formulation previously used) 12/21: successful transition of G tube to GJ tube. Plan scan for patency before resuming tube feeds.  12/22: Upper GI scan in IR today to ensure J-tube working.  RD recommendations for TFs noted.   12/23: exp lap, lysis of adhesions, segmental small bowel resection, gastrojejunostomy 12/25: TPN converted from cyclic back to continuous due to hypoglycemic event following end of TPN cycle and patient likely to remain hospitalized for at least several more days  Today, 02/09/2016:   Glucose (goal 100-150):  No hx DM; CBGs 121-156 since resuming TPN as a continuous infusion. Only 1 CBG slightly high (156), will continue to monitor more levels before adjusting insulin coverage.  Electrolytes: WNL except Na slightly low  Renal - SCr WNL, stable.  BUN elevated.  LFTs:  All below ULN 12/25  TGs: 177 ( 11/27), 93 (12/7), 119 (12/11) 148 (12/18); 134 (12/25)  Prealbumin:  15.3 ( 11/13), 10.8 (12/7), 12.8 (12/11), 13.1 (12/18), 8.9 (12/25) - falling  Weight up, IVF now off, TPN  providing 2L of fluid per day  NUTRITIONAL GOALS                                                                                             RD recs (12/7): Kcal 2000-2200, Protein 100-115g, Fluid 2L/day  [facility] TPN: 2156 Kcal/day, Protein 75g/day  - Cyclic regimen of Clinimix E 5/20 2L + of 20% Fat emulsion over 12 hours  provides 100 g protein/d, 2000 kCal/day - Continuous regimen of Clinimix-E 5/20 at 83 mL/hr + Fat Emulsion 20% at 10 mL/hr provides 100 g protein / day and 2240 KCal / day ---------------------------------------------------------------------------------------------------- (+++There is currently a Acupuncturist of Clinimix solution.  Pharmacy will use Clinimix product based on what we have available in stock+++)   PLAN                                                                                                                         At 1800 today:   Continue Clinimix-E 5/20 at 83 mL/hr (utilizes entire 2 L bag)  Continue lipid emulsion 20% at 10 ml/hr (increased yesterday due to falling prealbumin)  Continue 15 units regular insulin per TPN bag  TPN to contain standard multivitamins and trace elements.  Continue sensitive SSI and CBG checks q6h   BMet tomorrow  Routine TPN labs on Mondays and Thursdays  F/u plans for timing of next trial of tube feeds  Clance BollAmanda Zacharey Jensen, PharmD, BCPS Pager: [phone] 02/09/2016 7:55 AM

## 2016-02-09 NOTE — Progress Notes (Signed)
[facility] Office:  [phone] General Surgery Progress Note   LOS: 20 days  POD -  3 Days Post-Op  Assessment/Plan: 1.  EXPLORATORY LAPAROTOMY, LYSIS OF ADHESIONS, SEGMENTAL SMALL BOWEL RESECTION, GASTROJEJUNOSTOMY - 02/06/2016 [name]  (previous V&A with BI anastomosis - 09/25/2015 - [facility])  WBC - 21,000 - 02/09/2016  Looks okay - no specific complaint  To transfer out of ICU  2.  Leukocytosis  Improving  On Zosyn and Vanc - 12/25/>>>   3.  Malnutrition - on TPN  Prealbumin 8.9 - 02/08/2016 4.  Anemia  Hgb - 8.0 - 02/08/2016 5.  Trach 6.  DVT prophylaxis - SQ heparin 7.  Has foley.  To try foley out ... Hopefully this will help him ambulate.   Principal Problem:   Aspiration pneumonia (HCC) Active Problems:   Essential hypertension, benign   Dystrophy, Salzmann's nodular   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 09/25/2015   Anxiety state   Protein-calorie malnutrition, severe   Gastric tube present (LUQ)   Tracheostomy in place [facility](HCC)   On total parenteral nutrition (TPN)   Skin ulcer of abdominal wall, limited to breakdown of skin (HCC)   Dysphagia   Generalized weakness   Insomnia   SBO (small bowel obstruction)   Pleural effusion   Pressure injury of skin   Hypomagnesemia   Hypokalemia   Anemia of chronic disease   Stage 2 skin ulcer of sacral region  Subjective:  Doing okay without complaint.  He was out of bed for 3 hours today.  I reinforced that he needs to get out of bed and to the chair at least 4 times per day.  Wife and niece in room with Mr. [name].  Objective:   Vitals:   02/09/16 1000 02/09/16 1200  BP: (!) 129/45 (!) 122/50  Pulse: 90 88  Resp: (!) 23 (!) 23  Temp:       Intake/Output from previous day:  12/25 0701 - 12/26 0700 In: 2645.3 [I.V.:2025.3; IV Piggyback:605] Out: 1875 [Urine:1400; Drains:475]  Intake/Output this shift:  Total I/O In: 663 [I.V.:558; IV Piggyback:105] Out: 450  [Urine:300; Drains:150]   Physical Exam:   General: Older WM who is alert and oriented.   He is weak.   So peripheral edema.   HEENT: Normal. Pupils equal.  Has trach .   Lungs: Clear.   Abdomen: Soft, but quiet.   Wound: Clean.  GJ tube in LUQ.   Lab Results:     Recent Labs  02/08/16 0425 02/09/16 0429  WBC 32.0* 21.6*  HGB 8.5* 8.0*  HCT 25.5* 23.9*  PLT 381 447*    BMET    Recent Labs  02/08/16 0425 02/09/16 0429  NA 133* 133*  K 4.8 4.0  CL 107 106  CO2 21* 23  GLUCOSE 174* 158*  BUN 54* 42*  CREATININE 1.00 0.87  CALCIUM 7.8* 7.9*    PT/INR  No results for input(s): LABPROT, INR in the last 72 hours.  ABG  No results for input(s): PHART, HCO3 in the last 72 hours.  Invalid input(s): PCO2, PO2   Studies/Results:  No results found.   Anti-infectives:   Anti-infectives    Start     Dose/Rate Route Frequency Ordered Stop   02/09/16 0200  vancomycin (VANCOCIN) IVPB 1000 mg/200 mL premix     1,000 mg 200 mL/hr over 60 Minutes Intravenous Every 12 hours 02/08/16 1300     02/08/16 1400  vancomycin (VANCOCIN) IVPB 1000  mg/200 mL premix  Status:  Discontinued     1,000 mg 200 mL/hr over 60 Minutes Intravenous Every 12 hours 02/08/16 1259 02/08/16 1300   02/08/16 1300  vancomycin (VANCOCIN) IVPB 1000 mg/200 mL premix     1,000 mg 200 mL/hr over 60 Minutes Intravenous  Once 02/08/16 1217 02/08/16 1336   02/08/16 1200  piperacillin-tazobactam (ZOSYN) IVPB 3.375 g     3.375 g 12.5 mL/hr over 240 Minutes Intravenous Every 8 hours 02/08/16 1136     02/06/16 1415  piperacillin-tazobactam (ZOSYN) IVPB 3.375 g     3.375 g 100 mL/hr over 30 Minutes Intravenous  Once 02/06/16 1413 02/06/16 1409   01/26/16 2300  piperacillin-tazobactam (ZOSYN) IVPB 3.375 g  Status:  Discontinued     3.375 g 12.5 mL/hr over 240 Minutes Intravenous Every 8 hours 01/26/16 2256 02/04/16 0905   01/26/16 2230  vancomycin (VANCOCIN) IVPB 1000 mg/200 mL premix  Status:  Discontinued      1,000 mg 200 mL/hr over 60 Minutes Intravenous Every 12 hours 01/26/16 2227 01/26/16 2227   01/20/16 0400  vancomycin (VANCOCIN) IVPB 1000 mg/200 mL premix  Status:  Discontinued     1,000 mg 200 mL/hr over 60 Minutes Intravenous Every 12 hours 01/20/16 0323 01/21/16 0900   01/20/16 0330  ceFEPIme (MAXIPIME) 1 g in dextrose 5 % 50 mL IVPB     1 g 100 mL/hr over 30 Minutes Intravenous  Once 01/20/16 0317 01/20/16 0735      Ovidio Kinavid Fabiana Dromgoole, MD, FACS Pager: [phone] [facility] [place] [facility] Office: [phone] 02/09/2016

## 2016-02-09 NOTE — Progress Notes (Signed)
Occupational Therapy Treatment Patient Details Name: [name] MRN: [id number] DOB: 1940/10/08 Today's Date: 02/09/2016    [facility] r/o ileus. S/P EXPLORATORY LAPAROTOMY, LYSIS OF ADHESIONS, SEGMENTAL SMALL BOWEL RESECTION, GASTROJEJUNOSTOMY 12/23      Follow Up Recommendations  SNF    Equipment Recommendations  3 in 1 bedside commode       Precautions / Restrictions Precautions Precautions: Fall Precaution Comments: trach, gastric tube Restrictions Weight Bearing Restrictions: No       Mobility Bed Mobility          NT        Transfers      NT                    ADL Overall ADL's : Needs assistance/impaired                                       General ADL Comments: Pt in chair. OT noted LUE with edema in lower arm  and BP cuff tight on LUE upper arm.  RN made aware and BP cuff removed. OT positioned pts BUe on pillows and had pt perform BUE ROM to A with edema                Cognition   Behavior During Therapy: WFL for tasks assessed/performed Overall Cognitive Status: Within Functional Limits for tasks assessed                  General Comments: decreased STM noted- pt had already had meds and was asking for them            General Comments      Pertinent Vitals/ Pain       Pain Assessment: No/denies pain     Prior Functioning/Environment              Frequency  Min 2X/week        Progress Toward Goals  OT Goals(current goals can now be found in the care plan section)  Progress towards OT goals: Progressing toward goals     Plan Discharge plan remains appropriate    Co-evaluation                 End of Session     Activity Tolerance Patient tolerated treatment well   Patient Left in chair;with call bell/phone within reach   Nurse Communication Mobility status;Other (comment) (need for BUE elevation and ROM during day)         Time: [id number] OT Time Calculation (min): 10 min  Charges: OT General Charges $OT Visit: 1 Procedure OT Treatments $Therapeutic Activity: 8-22 mins  Samaa Ueda D 02/09/2016, 11:25 AM

## 2016-02-09 NOTE — Evaluation (Signed)
Physical Therapy  Re-Evaluation Patient Details Name: Ardyth GalRalph T Orlick MRN: [id number] DOB: 04-11-1940 Today's Date: 02/09/2016   History of Present Illness  [facility]x N/V r/o ileus. S/P EXPLORATORY LAPAROTOMY, LYSIS OF ADHESIONS, SEGMENTAL SMALL BOWEL RESECTION, GASTROJEJUNOSTOMY 12/23  Clinical Impression  The patient complains of fatigue and not wanting to get up today. RN encouraged patient, required 2 assist to transfer to recliner. The patient is much weaker than prior to surgery where he was ambulating several hundred feet. Continue PT while in acute care to address problems listed in note.     Follow Up Recommendations SNF;Supervision/Assistance - 24 hour    Equipment Recommendations  None recommended by PT    Recommendations for Other Services       Precautions / Restrictions Precautions Precautions: Fall Precaution Comments: trach, gastric tube      Mobility  Bed Mobility Overal bed mobility: Needs Assistance Bed Mobility: Supine to Sit     Supine to sit: Max assist;+2 for physical assistance;+2 for safety/equipment;HOB elevated     General bed mobility comments: the patient was reluctant to get up, required 2 assist and RN to  encourage the patient.   Transfers Overall transfer level: Needs assistance Equipment used: Rolling walker (2 wheeled) Transfers: Stand Pivot Transfers;Sit to/from Stand Sit to Stand: Max assist;+2 physical assistance;+2 safety/equipment Stand pivot transfers: Max assist;+2 safety/equipment;+2 physical assistance       General transfer comment: assist to reise and steady, small shuffle steps with bent knees to get to recliner.   Ambulation/Gait                Stairs            Wheelchair Mobility    Modified Rankin (Stroke Patients Only)       Balance   Sitting-balance support: Feet supported;Bilateral upper extremity supported Sitting balance-Leahy Scale: Fair       Standing  balance-Leahy Scale: Poor                               Pertinent Vitals/Pain Pain Assessment: No/denies pain Faces Pain Scale: Hurts whole lot Pain Location: abdomen Pain Descriptors / Indicators: Discomfort;Grimacing Pain Intervention(s): Monitored during session;Premedicated before session;Repositioned    Home Living Family/patient expects to be discharged to:: Skilled nursing facility                      Prior Function                 Hand Dominance        Extremity/Trunk Assessment   Upper Extremity Assessment Upper Extremity Assessment: Generalized weakness    Lower Extremity Assessment Lower Extremity Assessment: Generalized weakness    Cervical / Trunk Assessment Cervical / Trunk Assessment: Normal  Communication      Cognition Arousal/Alertness: Awake/alert Behavior During Therapy: Flat affect;Anxious Overall Cognitive Status: Within Functional Limits for tasks assessed                 General Comments: decreased STM noted- pt had already had meds and was asking for them    General Comments      Exercises     Assessment/Plan    PT Assessment Patient needs continued PT services  PT Problem List Decreased strength;Decreased activity tolerance;Decreased balance;Decreased mobility;Cardiopulmonary status limiting activity;Decreased knowledge of precautions;Decreased safety awareness;Decreased knowledge of use of DME;Decreased skin integrity  PT Treatment Interventions DME instruction;Functional mobility training;Gait training;Therapeutic activities;Therapeutic exercise;Balance training;Patient/family education    PT Goals (Current goals can be found in the Care Plan section)  Acute Rehab PT Goals Patient Stated Goal: does not want to get up today, tomot=rrow. PT Goal Formulation: With patient Time For Goal Achievement: 02/23/16 Potential to Achieve Goals: Fair    Frequency Min 3X/week   Barriers to  discharge Decreased caregiver support      Co-evaluation               End of Session Equipment Utilized During Treatment: Gait belt Activity Tolerance: Patient limited by fatigue;Patient limited by pain Patient left: in chair;with call bell/phone within reach;with chair alarm set Nurse Communication: Mobility status         Time: [id number] PT Time Calculation (min) (ACUTE ONLY): 23 min   Charges:   PT Evaluation $PT Re-evaluation: 1 Procedure PT Treatments $Therapeutic Activity: 8-22 mins   PT G Codes:        [name], Kyran Connaughton Elizabeth 02/09/2016, 1:46 PM Blanchard KelchKaren Brianne Maina PT [phone]

## 2016-02-10 ENCOUNTER — Inpatient Hospital Stay (HOSPITAL_COMMUNITY): Payer: Medicare Other

## 2016-02-10 LAB — BASIC METABOLIC PANEL
ANION GAP: 4 — AB (ref 5–15)
BUN: 32 mg/dL — ABNORMAL HIGH (ref 6–20)
CALCIUM: 7.9 mg/dL — AB (ref 8.9–10.3)
CO2: 23 mmol/L (ref 22–32)
CREATININE: 0.7 mg/dL (ref 0.61–1.24)
Chloride: 107 mmol/L (ref 101–111)
Glucose, Bld: 134 mg/dL — ABNORMAL HIGH (ref 65–99)
Potassium: 3.6 mmol/L (ref 3.5–5.1)
Sodium: 134 mmol/L — ABNORMAL LOW (ref 135–145)

## 2016-02-10 LAB — TYPE AND SCREEN
ABO/RH(D): A POS
Antibody Screen: NEGATIVE
UNIT DIVISION: 0
Unit division: 0
Unit division: 0
Unit division: 0
Unit division: 0
Unit division: 0

## 2016-02-10 LAB — GLUCOSE, CAPILLARY
GLUCOSE-CAPILLARY: 120 mg/dL — AB (ref 65–99)
GLUCOSE-CAPILLARY: 122 mg/dL — AB (ref 65–99)
Glucose-Capillary: 138 mg/dL — ABNORMAL HIGH (ref 65–99)

## 2016-02-10 LAB — MAGNESIUM: Magnesium: 1.7 mg/dL (ref 1.7–2.4)

## 2016-02-10 MED ORDER — SODIUM CHLORIDE 0.9 % IV SOLN: 30.0000 meq | Freq: Once | INTRAVENOUS | Status: DC

## 2016-02-10 MED ORDER — TRACE MINERALS CR-CU-MN-SE-ZN 10-1000-500-60 MCG/ML IV SOLN
INTRAVENOUS | Status: AC
  Administered 2016-02-10: 17:00:00 via INTRAVENOUS
  Filled 2016-02-10 (×2): qty 2000

## 2016-02-10 MED ORDER — POTASSIUM CHLORIDE 10 MEQ/100ML IV SOLN
10.0000 meq | INTRAVENOUS | Status: AC
  Administered 2016-02-10 (×3): 10 meq via INTRAVENOUS
  Filled 2016-02-10 (×3): qty 100

## 2016-02-10 MED ORDER — FAT EMULSION 20 % IV EMUL
240.0000 mL | INTRAVENOUS | Status: AC
  Administered 2016-02-10: 240 mL via INTRAVENOUS
  Filled 2016-02-10 (×2): qty 250

## 2016-02-10 NOTE — Progress Notes (Signed)
MD returned to bedside, Clinical research associatewriter assisted with placement of single stitch to help prevent dislodgement again of g/j tube. KUB to done to confirm placement of the tube. Family at the bedside. Will continue to monitor.

## 2016-02-10 NOTE — Progress Notes (Signed)
[facility] Office:  [phone] General Surgery Progress Note   LOS: 21 days  POD -  4 Days Post-Op  Assessment/Plan: 1.  EXPLORATORY LAPAROTOMY, LYSIS OF ADHESIONS, SEGMENTAL SMALL BOWEL RESECTION, GASTROJEJUNOSTOMY - 02/06/2016 [name]  (previous V&A with BI anastomosis - 09/25/2015 - [facility])  WBC - 21,000 - 02/09/2016  Looks okay - no specific complaint  To transfer out of ICU  2.  Leukocytosis  Improving  On Zosyn and Vanc - 12/25/>>> 3.  Two runs of V tach early this AM  Will check Mg++ and send to telimetry   4.  Malnutrition - on TPN  Prealbumin 8.9 - 02/08/2016 5.  Anemia  Hgb - 8.0 - 02/08/2016 6.  Longstanding trach 7.  DVT prophylaxis - SQ heparin   Principal Problem:   Aspiration pneumonia (HCC) Active Problems:   Essential hypertension, benign   Dystrophy, Salzmann's nodular   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 09/25/2015   Anxiety state   Protein-calorie malnutrition, severe   Gastric tube present (LUQ)   Tracheostomy in place [facility](HCC)   On total parenteral nutrition (TPN)   Skin ulcer of abdominal wall, limited to breakdown of skin (HCC)   Dysphagia   Generalized weakness   Insomnia   SBO (small bowel obstruction)   Pleural effusion   Pressure injury of skin   Hypomagnesemia   Hypokalemia   Anemia of chronic disease   Stage 2 skin ulcer of sacral region  Subjective:  Just got medicated.  But doing okay.  No flatus or BM.  Daughter, [name], in room with patient.  Objective:   Vitals:   02/10/16 0500 02/10/16 0600  BP: (!) 115/37 (!) 120/40  Pulse: 90 89  Resp: 18 15  Temp:       Intake/Output from previous day:  12/26 0701 - 12/27 0700 In: 2966.7 [I.V.:2221.7; IV Piggyback:715] Out: 2300 [Urine:1725; Drains:575]  Intake/Output this shift:  No intake/output data recorded.   Physical Exam:   General: Older WM who is alert and oriented.   He is weak.   So peripheral edema.   HEENT: Normal. Pupils  equal.  Has trach .   Lungs: Clear.   Abdomen: Soft, but quiet.   Wound: A little soupy at the bottom.  Will increase dressing changes to BID.  GJ tube in LUQ.   Lab Results:     Recent Labs  02/08/16 0425 02/09/16 0429  WBC 32.0* 21.6*  HGB 8.5* 8.0*  HCT 25.5* 23.9*  PLT 381 447*    BMET    Recent Labs  02/09/16 0429 02/10/16 0405  NA 133* 134*  K 4.0 3.6  CL 106 107  CO2 23 23  GLUCOSE 158* 134*  BUN 42* 32*  CREATININE 0.87 0.70  CALCIUM 7.9* 7.9*    PT/INR  No results for input(s): LABPROT, INR in the last 72 hours.  ABG  No results for input(s): PHART, HCO3 in the last 72 hours.  Invalid input(s): PCO2, PO2   Studies/Results:  No results found.   Anti-infectives:   Anti-infectives    Start     Dose/Rate Route Frequency Ordered Stop   02/09/16 0200  vancomycin (VANCOCIN) IVPB 1000 mg/200 mL premix     1,000 mg 200 mL/hr over 60 Minutes Intravenous Every 12 hours 02/08/16 1300     02/08/16 1400  vancomycin (VANCOCIN) IVPB 1000 mg/200 mL premix  Status:  Discontinued     1,000 mg 200 mL/hr over 60 Minutes Intravenous  Every 12 hours 02/08/16 1259 02/08/16 1300   02/08/16 1300  vancomycin (VANCOCIN) IVPB 1000 mg/200 mL premix     1,000 mg 200 mL/hr over 60 Minutes Intravenous  Once 02/08/16 1217 02/08/16 1336   02/08/16 1200  piperacillin-tazobactam (ZOSYN) IVPB 3.375 g     3.375 g 12.5 mL/hr over 240 Minutes Intravenous Every 8 hours 02/08/16 1136     02/06/16 1415  piperacillin-tazobactam (ZOSYN) IVPB 3.375 g     3.375 g 100 mL/hr over 30 Minutes Intravenous  Once 02/06/16 1413 02/06/16 1409   01/26/16 2300  piperacillin-tazobactam (ZOSYN) IVPB 3.375 g  Status:  Discontinued     3.375 g 12.5 mL/hr over 240 Minutes Intravenous Every 8 hours 01/26/16 2256 02/04/16 0905   01/26/16 2230  vancomycin (VANCOCIN) IVPB 1000 mg/200 mL premix  Status:  Discontinued     1,000 mg 200 mL/hr over 60 Minutes Intravenous Every 12 hours 01/26/16 2227 01/26/16 2227    01/20/16 0400  vancomycin (VANCOCIN) IVPB 1000 mg/200 mL premix  Status:  Discontinued     1,000 mg 200 mL/hr over 60 Minutes Intravenous Every 12 hours 01/20/16 0323 01/21/16 0900   01/20/16 0330  ceFEPIme (MAXIPIME) 1 g in dextrose 5 % 50 mL IVPB     1 g 100 mL/hr over 30 Minutes Intravenous  Once 01/20/16 0317 01/20/16 0735      Ovidio Kinavid Shirline Kendle, MD, FACS Pager: [phone] [facility] [place] [facility] Office: [phone] 02/10/2016

## 2016-02-10 NOTE — Progress Notes (Signed)
While transferring pt back to bed from the bedside chair, the pts g/j tube became caught on the bottom of the chair partially dislodging the tube from the site. The tube was secured and the pt was placed back into the bed. All dressings were changed, tube again secured with tape and MD paged. Brooke the PA with [facility] returned the paged and was notified of this incident. Family is at the bedside and aware of the situation. Pt asymptomatic at this time. Will continue to monitor the pt.

## 2016-02-10 NOTE — Progress Notes (Signed)
Pt had 15 beat run of ventricular tachycardia at 0300 and a 9 beat run of ventricular tachycardia at 0403. RN was in room with pt both times and assessed- pt asymptomatic. [facility] attending paged both times, labs drawn. No orders placed at this time. Will continue to monitor.   Sinclair GroomsNivi Micholas Drumwright, RN

## 2016-02-10 NOTE — Progress Notes (Signed)
Dr. Ezzard StandingNewman and Nehemiah SettleBrooke, GeorgiaPA with [facility] arrived to the unit only minutes after pts tube became dislodged. MD assessed the G/J tube making adjusts needed and will place stitch. Will assist MD as needed and continue to monitor the pt. Family at bedside.

## 2016-02-10 NOTE — Progress Notes (Signed)
PHARMACY - ADULT TOTAL PARENTERAL NUTRITION CONSULT NOTE   Pharmacy Consult for TPN Indication: PTA TPN continuation, intolerance to enteral feed  Insulin Requirements:  SSI:  6 units Novolog sensitive SSI coverage q6h  TPN:  15 units regular insulin in TPN bag  Current Nutrition:  - Clinimix as below  - Tube feeds stopped due to nausea 12/19, remains off.  CCS will consider trial of trickle TF on postop day #5 (12/28).  IVF: off  Central access: PICC TPN start date: PTA, restarted on admission on 12/6    Recent Labs  02/07/16 1001 02/08/16 0425 02/09/16 0429 02/10/16 0405  NA 133* 133* 133* 134*  K 4.7 4.8 4.0 3.6  CL 106 107 106 107  CO2 23 21* 23 23  GLUCOSE 88 174* 158* 134*  BUN 44* 54* 42* 32*  CREATININE 1.03 1.00 0.87 0.70  CALCIUM 7.6* 7.8* 7.9* 7.9*  PHOS 3.8 4.4  --   --   MG 1.6* 1.9  --  1.7  ALBUMIN 1.3* 1.3*  --   --   ALKPHOS 118 114  --   --   AST 26 21  --   --   ALT 30 23  --   --   BILITOT 0.3 0.4  --   --   TRIG  --  134  --   --   PREALBUMIN  --  8.9*  --   --   Corr Ca: 10 on 12/25  ASSESSMENT                                                                                                          HPI: [age] yoM with hx PUD, CAD, GERD, BPH,  s/p multiple procedures  and extended hospitalization for recurrent partial GOO 2/2 pyloric stricture on TPN for several days, discharged 11/15 with J/G tube in place on TFs, unable to tolerate and had to transition back to [facility] on 11/22, readmitted 12/6 with partial SBO. Pharmacy consulted to resume TPN. Note: PTA TNA from [facility] was as follows: Totals per day: 405 grams (1376 kcal)  of dextrose, 75 grams (300 kcal) of protein and 50 grams ( 480 kcal) of lipid for a total of 2156 kcal/day   Significant events:  12/6: TPN continued on admission.  12/7: Pt pulled out NGT, not replaced, remains NPO 12/11: Awaiting MBS, if no aspiration, will start to advance diet.  Having bowel function.   Continue TPN for now.   12/12: Severe aspiration risk per MBS yesterday - recommend continuing NPO. Osmolite 1.2 CAL via TF BID ordered 12/11 at 6pm. Per discussion with CCS PA, continue TPN at current goal rate today, wean tomorrow if tolerates TF. 12/13: Did not tolerate bolus feeds through G-tube; overnight emesis and aspiration event, started on Zosyn. CCS trying trickle feeds today. 12/14 transition to cyclic TPN - appears recovery may be prolonged. Not tolerating trickle feeds; G-tube to drainage.  12/15: TPN was not run at cyclic rate overnight d/t conflicting information in TPN order. To provide  patient full needed nutrition, will continue to run TPN at 83 ml/hr over 24 hr and begin 18-hr cycle with tonight's TPN 12/16: start 12h TPN cycle 12/18: resume trickle tube feed at 5 ml/hr 12/19: Nausea/vomiting, tube feed held. TPN formula changed to E 5/20 from E 5/15 12/21: successful transition of G tube to GJ tube. Plan scan for patency before resuming tube feeds.  12/22: Upper GI scan in IR today to ensure J-tube working.  12/23: exp lap, lysis of adhesions, segmental small bowel resection, gastrojejunostomy 12/25: TPN converted from cyclic back to continuous due to hypoglycemic event following end of TPN cycle and patient likely to remain hospitalized for at least several more days.  Today, 02/10/2016:   Glucose (goal 100-150):  No hx DM; CBGs 127-156, more stable since resuming TPN as a continuous infusion. Only 1 CBG slightly high (156), will continue to monitor more levels before adjusting insulin coverage.  Electrolytes: WNL except Na slightly low and K at low end of normal range.  KCl IV per MD today.    Renal - SCr WNL, decreasing.  BUN elevated but decreasing.    LFTs:  All below ULN 12/25  TGs: 177 ( 11/27), 93 (12/7), 119 (12/11) 148 (12/18); 134 (12/25)  Prealbumin:  15.3 ( 11/13), 10.8 (12/7), 12.8 (12/11), 13.1 (12/18), 8.9 (12/25) - falling  NUTRITIONAL GOALS                                                                                              RD recs (12/26): Kcal 2000-2200, Protein 100-115g, Fluid 2L/day [facility] TPN: 2156 Kcal/day, Protein 75g/day  Clinimix E 5/20 at a goal rate of 83 ml/hr + 20% fat emulsion at 10 ml/hr to provide: 100 g/day protein, 2240 Kcal/day. (+++There is currently a Acupuncturist of Clinimix solution.  Pharmacy will use Clinimix product based on what we have available in stock+++)  PLAN                                                                                                                         KCl 10 mEq IV x3 runs per MD  At 1800 today:  Continue Clinimix E 5/20 at 83 ml/hr.  Continue 20% fat emulsion at 10 ml/hr.  TPN to contain 15 units regular insulin   TPN to contain standard multivitamins and trace elements.  Continue sensitive SSI and CBG checks q6h   TPN lab panels on Mondays & Thursdays.  F/u daily.  F/u plans for trial of trickle tube feeds on 12/28 and possible TPN weaning on 12/29 if tolerating.   Lynann Beaver  PharmD, BCPS Pager [phone] 02/10/2016 8:43 AM

## 2016-02-11 LAB — COMPREHENSIVE METABOLIC PANEL
ALT: 23 U/L (ref 17–63)
ANION GAP: 5 (ref 5–15)
AST: 28 U/L (ref 15–41)
Albumin: 1.2 g/dL — ABNORMAL LOW (ref 3.5–5.0)
Alkaline Phosphatase: 280 U/L — ABNORMAL HIGH (ref 38–126)
BUN: 27 mg/dL — ABNORMAL HIGH (ref 6–20)
CHLORIDE: 107 mmol/L (ref 101–111)
CO2: 24 mmol/L (ref 22–32)
Calcium: 8.2 mg/dL — ABNORMAL LOW (ref 8.9–10.3)
Creatinine, Ser: 0.76 mg/dL (ref 0.61–1.24)
GFR calc non Af Amer: >60 mL/min (ref >60)
Glucose, Bld: 143 mg/dL — ABNORMAL HIGH (ref 65–99)
Potassium: 3.9 mmol/L (ref 3.5–5.1)
SODIUM: 136 mmol/L (ref 135–145)
Total Bilirubin: 0.7 mg/dL (ref 0.3–1.2)
Total Protein: 5.1 g/dL — ABNORMAL LOW (ref 6.5–8.1)

## 2016-02-11 LAB — CBC WITH DIFFERENTIAL/PLATELET
BASOS ABS: 0 10*3/uL (ref 0.0–0.1)
Basophils Relative: 0 %
Eosinophils Absolute: 0.1 10*3/uL (ref 0.0–0.7)
Eosinophils Relative: 1 %
HEMATOCRIT: 23.8 % — AB (ref 39.0–52.0)
Hemoglobin: 7.8 g/dL — ABNORMAL LOW (ref 13.0–17.0)
LYMPHS PCT: 18 %
Lymphs Abs: 2.6 10*3/uL (ref 0.7–4.0)
MCH: 29.9 pg (ref 26.0–34.0)
MCHC: 32.8 g/dL (ref 30.0–36.0)
MCV: 91.2 fL (ref 78.0–100.0)
Monocytes Absolute: 1.4 10*3/uL — ABNORMAL HIGH (ref 0.1–1.0)
Monocytes Relative: 10 %
NEUTROS ABS: 10.4 10*3/uL — AB (ref 1.7–7.7)
Neutrophils Relative %: 71 %
Platelets: 509 10*3/uL — ABNORMAL HIGH (ref 150–400)
RBC: 2.61 MIL/uL — AB (ref 4.22–5.81)
RDW: 16.9 % — ABNORMAL HIGH (ref 11.5–15.5)
WBC: 14.5 10*3/uL — AB (ref 4.0–10.5)

## 2016-02-11 LAB — GLUCOSE, CAPILLARY
GLUCOSE-CAPILLARY: 102 mg/dL — AB (ref 65–99)
GLUCOSE-CAPILLARY: 110 mg/dL — AB (ref 65–99)
GLUCOSE-CAPILLARY: 112 mg/dL — AB (ref 65–99)
Glucose-Capillary: 106 mg/dL — ABNORMAL HIGH (ref 65–99)

## 2016-02-11 LAB — PHOSPHORUS: PHOSPHORUS: 3.8 mg/dL (ref 2.5–4.6)

## 2016-02-11 LAB — MAGNESIUM: Magnesium: 1.7 mg/dL (ref 1.7–2.4)

## 2016-02-11 MED ORDER — MORPHINE SULFATE (PF) 4 MG/ML IV SOLN
4.0000 mg | Freq: Once | INTRAVENOUS | Status: AC
  Administered 2016-02-11: 4 mg via INTRAVENOUS
  Filled 2016-02-11: qty 1

## 2016-02-11 MED ORDER — FAT EMULSION 20 % IV EMUL
240.0000 mL | INTRAVENOUS | Status: AC
  Administered 2016-02-11: 240 mL via INTRAVENOUS
  Filled 2016-02-11: qty 250

## 2016-02-11 MED ORDER — JEVITY 1.2 CAL PO LIQD: 1000.0000 mL | ORAL | Status: DC

## 2016-02-11 MED ORDER — MORPHINE SULFATE (PF) 2 MG/ML IV SOLN
2.0000 mg | INTRAVENOUS | Status: DC | PRN
  Administered 2016-02-11 – 2016-02-13 (×15): 4 mg via INTRAVENOUS
  Administered 2016-02-14: 2 mg via INTRAVENOUS
  Administered 2016-02-14 (×3): 4 mg via INTRAVENOUS
  Administered 2016-02-14: 2 mg via INTRAVENOUS
  Administered 2016-02-14 – 2016-02-15 (×7): 4 mg via INTRAVENOUS
  Administered 2016-02-15: 2 mg via INTRAVENOUS
  Administered 2016-02-15: 4 mg via INTRAVENOUS
  Filled 2016-02-11 (×3): qty 2
  Filled 2016-02-11: qty 1
  Filled 2016-02-11 (×16): qty 2
  Filled 2016-02-11: qty 1
  Filled 2016-02-11 (×7): qty 2

## 2016-02-11 MED ORDER — VITAL 1.5 CAL PO LIQD
1000.0000 mL | ORAL | Status: DC
  Administered 2016-02-11: 1000 mL
  Filled 2016-02-11: qty 1000

## 2016-02-11 MED ORDER — TRACE MINERALS CR-CU-MN-SE-ZN 10-1000-500-60 MCG/ML IV SOLN
INTRAVENOUS | Status: AC
  Administered 2016-02-11: 18:00:00 via INTRAVENOUS
  Filled 2016-02-11: qty 2000

## 2016-02-11 NOTE — Progress Notes (Signed)
Physical Therapy Treatment Patient Details Name: [name] MRN: [id number] DOB: Jul 21, 1940 Today's Date: 02/11/2016    History of Present Illness [facility].Dx N/V r/o ileus. S/P 02/06/16 forEXPLORATORY LAPAROTOMY, LYSIS OF ADHESIONS, SEGMENTAL SMALL BOWEL RESECTION, GASTROJEJUNOSTOMY 12/23    PT Comments    The patient is slowly improving in  Strength and mobility. Less assistance required to transfer to the recliner. Hopefully will be able to ambulate next PT visit. .  Follow Up Recommendations  SNF;Supervision/Assistance - 24 hour     Equipment Recommendations  None recommended by PT    Recommendations for Other Services       Precautions / Restrictions Precautions Precautions: Fall Precaution Comments: trach, gastric tube Restrictions Weight Bearing Restrictions: No    Mobility  Bed Mobility Overal bed mobility: Needs Assistance Bed Mobility: Supine to Sit     Supine to sit: HOB elevated;+2 for physical assistance;+2 for safety/equipment;Max assist     General bed mobility comments: the patient was encouraged to get uop.  Transfers Overall transfer level: Needs assistance Equipment used: Rolling walker (2 wheeled) Transfers: Stand Pivot Transfers;Sit to/from Stand Sit to Stand: Mod assist;+2 physical assistance;+2 safety/equipment;From elevated surface Stand pivot transfers: +2 safety/equipment;+2 physical assistance;Mod assist       General transfer comment: assist to reise and steady,improved steps to transfer to the recliner.Posture improved and more steady on the feet.  Ambulation/Gait                 Stairs            Wheelchair Mobility    Modified Rankin (Stroke Patients Only)       Balance Overall balance assessment: Needs assistance Sitting-balance support: Feet supported;Bilateral upper extremity supported   Sitting balance - Comments: sitting in midline without support.   Standing balance  support: During functional activity;Bilateral upper extremity supported Standing balance-Leahy Scale: Poor                      Cognition Arousal/Alertness: Awake/alert Behavior During Therapy: Flat affect Overall Cognitive Status: Within Functional Limits for tasks assessed                      Exercises      General Comments        Pertinent Vitals/Pain Faces Pain Scale: Hurts little more Pain Location: abdomen Pain Descriptors / Indicators: Discomfort;Grimacing Pain Intervention(s): Monitored during session    Home Living Family/patient expects to be discharged to:: Skilled nursing facility Living Arrangements: Spouse/significant other                  Prior Function            PT Goals (current goals can now be found in the care plan section) Progress towards PT goals: Progressing toward goals    Frequency    Min 3X/week      PT Plan Current plan remains appropriate    Co-evaluation             End of Session   Activity Tolerance: Patient tolerated treatment well Patient left: in chair;with call bell/phone within reach;with chair alarm set;with family/visitor present     Time: [id number] PT Time Calculation (min) (ACUTE ONLY): 25 min  Charges:  $Therapeutic Activity: 23-37 mins                    G Codes:      [name], Jobe IgoKaren [name]  02/11/2016, 1:05 PM

## 2016-02-11 NOTE — Progress Notes (Signed)
PHARMACY - ADULT TOTAL PARENTERAL NUTRITION CONSULT NOTE   Pharmacy Consult for TPN Indication: PTA TPN continuation, intolerance to enteral feed  Insulin Requirements:  SSI:  2 units Novolog sensitive SSI yesterday TPN: 15 units regular insulin in TPN bag  Current Nutrition:  - Clinimix as below  - TFs at 10 ml/hr (has not started yet)  IVF: off  Central access: PICC TPN start date: PTA, restarted on admission on 12/6    Recent Labs  02/10/16 0405 02/11/16 0323  NA 134* 136  K 3.6 3.9  CL 107 107  CO2 23 24  GLUCOSE 134* 143*  BUN 32* 27*  CREATININE 0.70 0.76  CALCIUM 7.9* 8.2*  PHOS  --  3.8  MG 1.7 1.7  ALBUMIN  --  1.2*  ALKPHOS  --  280*  AST  --  28  ALT  --  23  BILITOT  --  0.7  Corr Ca: 10 on 12/25  ASSESSMENT                                                                                                          HPI: 74 yoM with hx PUD, CAD, GERD, BPH,  s/p multiple procedures  and extended hospitalization for recurrent partial GOO 2/2 pyloric stricture on TPN for several days, discharged 11/15 with J/G tube in place on TFs, unable to tolerate and had to transition back to TPN via [facility] on 11/22, readmitted 12/6 with partial SBO. Pharmacy consulted to resume TPN. Note: PTA TNA from [place] was as follows: Totals per day: 405 grams (1376 kcal)  of dextrose, 75 grams (300 kcal) of protein and 50 grams ( 480 kcal) of lipid for a total of 2156 kcal/day   Significant events:  12/6: TPN continued on admission.  12/7: Pt pulled out NGT, not replaced, remains NPO 12/11: Awaiting MBS, if no aspiration, will start to advance diet.  Having bowel function.  Continue TPN for now.   12/12: Severe aspiration risk per MBS yesterday - recommend continuing NPO. Osmolite 1.2 CAL via TF BID ordered 12/11 at 6pm. Per discussion with CCS PA, continue TPN at current goal rate today, wean tomorrow if tolerates TF. 12/13: Did not tolerate bolus feeds through G-tube;  overnight emesis and aspiration event, started on Zosyn. CCS trying trickle feeds today. 12/14 transition to cyclic TPN - appears recovery may be prolonged. Not tolerating trickle feeds; G-tube to drainage.  12/15: TPN was not run at cyclic rate overnight d/t conflicting information in TPN order. To provide patient full needed nutrition, will continue to run TPN at 83 ml/hr over 24 hr and begin 18-hr cycle with tonight's TPN 12/16: start 12h TPN cycle 12/18: resume trickle tube feed at 5 ml/hr 12/19: Nausea/vomiting, tube feed held. TPN formula changed to E 5/20 from E 5/15 12/21: successful transition of G tube to [place] tube. Plan scan for patency before resuming tube feeds.  12/22: Upper GI scan in IR today to ensure J-tube working.  12/23: exp lap, lysis of adhesions, segmental small bowel resection,  gastrojejunostomy 12/25: TPN converted from cyclic back to continuous due to hypoglycemic event following end of TPN cycle and patient likely to remain hospitalized for at least several more days. 12/28: to start trickle feeds thru J-tube; G-tube remains on drainage  Today, 02/11/2016:   Glucose (goal 100-150):  No hx DM; CBGs 102-138, lower since resuming TPN as a continuous infusion.   Electrolytes: WNL except Mag borderline low    Renal - SCr WNL, decreasing.  BUN elevated but decreasing.    LFTs:  Alk phos now elevated, previously wnl.  Albumin low, all others wnl  TGs: 177 (11/27), 93 (12/7), 119 (12/11) 148 (12/18); 134 (12/25)  Prealbumin:  15.3 ( 11/13), 10.8 (12/7), 12.8 (12/11), 13.1 (12/18), 8.9 (12/25) - falling  NUTRITIONAL GOALS                                                                                             RD recs (12/26): Kcal 2000-2200, Protein 100-115g, Fluid 2L/day [facility] TPN: 2156 Kcal/day, Protein 75g/day  Clinimix E 5/20 at a goal rate of 83 ml/hr + 20% fat emulsion at 10 ml/hr to provide: 100 g/day protein, 2240 Kcal/day. (+++There is currently a Metallurgist of Clinimix solution.  Pharmacy will use Clinimix product based on what we have available in stock+++)  PLAN                                                                                                                          At 1800 today:  Continue Clinimix E 5/20 at 83 ml/hr - If Alk phos continues to rise may want to consider switching to 5/15 mixture, but this is only available without electrolytes currently  Continue 20% fat emulsion at 10 ml/hr.  Decrease insulin in TPN to 10 units as CBGs dropping and previous hypoglycemic events noted; SSI ordered   TPN to contain standard multivitamins and trace elements.  Continue sensitive SSI and CBG checks q6h   TPN lab panels on Mondays & Thursdays.  Mag level tomorrow AM, other lytes stable. Can recheck BMP Saturday  F/u daily.  Consider weaning TPN if tolerating TFs overnight   Reuel Boom, PharmD, BCPS Pager: [phone] 02/11/2016, 1:26 PM

## 2016-02-11 NOTE — Progress Notes (Signed)
[facility] Office:  [phone] General Surgery Progress Note   LOS: 22 days  POD -  5 Days Post-Op  Assessment/Plan: 1.  EXPLORATORY LAPAROTOMY, LYSIS OF ADHESIONS, SEGMENTAL SMALL BOWEL RESECTION, GASTROJEJUNOSTOMY - 02/06/2016 [name]  (previous V&A with BI anastomosis - 09/25/2015 - [place])  Open midline incision  On 4 east now Will start drips through J tube.  2.  Leukocytosis  Improving - WBC - 14,500 - 02/11/2016  On Zosyn and Vanc - 12/25 >>> 3.  Two runs of V tach on 12/27  No further issues  4.  Malnutrition - on TPN  Prealbumin 8.9 - 02/08/2016 5.  Anemia  Hgb - 7.8 - 02/11/2016 6.  Longstanding trach 7.  DVT prophylaxis - SQ heparin   Principal Problem:   Aspiration pneumonia (HCC) Active Problems:   Essential hypertension, benign   Dystrophy, Salzmann's nodular   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 09/25/2015   Anxiety state   Protein-calorie malnutrition, severe   Gastric tube present (LUQ)   Tracheostomy in place [facility](HCC)   On total parenteral nutrition (TPN)   Skin ulcer of abdominal wall, limited to breakdown of skin (HCC)   Dysphagia   Generalized weakness   Insomnia   SBO (small bowel obstruction)   Pleural effusion   Pressure injury of skin   Hypomagnesemia   Hypokalemia   Anemia of chronic disease   Stage 2 skin ulcer of sacral region  Subjective:  Doing okay.  No energy to move.  Again stressed getting out of bed important.   Objective:   Vitals:   02/11/16 0611 02/11/16 1001  BP: (!) 124/42   Pulse: 84 84  Resp: 18 16  Temp: 98.1 F (36.7 C)      Intake/Output from previous day:  12/27 0701 - 12/28 0700 In: 1896.5 [I.V.:1136.5; IV Piggyback:610] Out: 1900 [Urine:1550; Drains:350]  Intake/Output this shift:  Total I/O In: -  Out: 175 [Urine:175]   Physical Exam:   General: Older WM who is alert and oriented.   He is weak.   Has peripheral edema.   HEENT: Normal. Pupils equal.  Has  trach .   Lungs: Clear.   Abdomen: Soft, but quiet.   Wound: BID dressing changes. GJ tube in LUQ.   Lab Results:     Recent Labs  02/09/16 0429 02/11/16 0323  WBC 21.6* 14.5*  HGB 8.0* 7.8*  HCT 23.9* 23.8*  PLT 447* 509*    BMET    Recent Labs  02/10/16 0405 02/11/16 0323  NA 134* 136  K 3.6 3.9  CL 107 107  CO2 23 24  GLUCOSE 134* 143*  BUN 32* 27*  CREATININE 0.70 0.76  CALCIUM 7.9* 8.2*    PT/INR  No results for input(s): LABPROT, INR in the last 72 hours.  ABG  No results for input(s): PHART, HCO3 in the last 72 hours.  Invalid input(s): PCO2, PO2   Studies/Results:  Dg Abd 1 View  Result Date: 02/10/2016 CLINICAL DATA:  Jejunostomy tube placement EXAM: ABDOMEN - 1 VIEW COMPARISON:  02/06/2016 FINDINGS: Contrast is seen within decompressed small bowel loops. No evidence of contrast extravasation or bowel obstruction. Prior cholecystectomy. IMPRESSION: Contrast seen within decompressed small bowel loops. No contrast extravasation. Electronically Signed   By: Charlett NoseKevin  Dover M.D.   On: 02/10/2016 18:37     Anti-infectives:   Anti-infectives    Start     Dose/Rate Route Frequency Ordered Stop   02/09/16 0200  vancomycin (VANCOCIN) IVPB 1000 mg/200 mL premix     1,000 mg 200 mL/hr over 60 Minutes Intravenous Every 12 hours 02/08/16 1300     02/08/16 1400  vancomycin (VANCOCIN) IVPB 1000 mg/200 mL premix  Status:  Discontinued     1,000 mg 200 mL/hr over 60 Minutes Intravenous Every 12 hours 02/08/16 1259 02/08/16 1300   02/08/16 1300  vancomycin (VANCOCIN) IVPB 1000 mg/200 mL premix     1,000 mg 200 mL/hr over 60 Minutes Intravenous  Once 02/08/16 1217 02/08/16 1336   02/08/16 1200  piperacillin-tazobactam (ZOSYN) IVPB 3.375 g     3.375 g 12.5 mL/hr over 240 Minutes Intravenous Every 8 hours 02/08/16 1136     02/06/16 1415  piperacillin-tazobactam (ZOSYN) IVPB 3.375 g     3.375 g 100 mL/hr over 30 Minutes Intravenous  Once 02/06/16 1413 02/06/16 1409    01/26/16 2300  piperacillin-tazobactam (ZOSYN) IVPB 3.375 g  Status:  Discontinued     3.375 g 12.5 mL/hr over 240 Minutes Intravenous Every 8 hours 01/26/16 2256 02/04/16 0905   01/26/16 2230  vancomycin (VANCOCIN) IVPB 1000 mg/200 mL premix  Status:  Discontinued     1,000 mg 200 mL/hr over 60 Minutes Intravenous Every 12 hours 01/26/16 2227 01/26/16 2227   01/20/16 0400  vancomycin (VANCOCIN) IVPB 1000 mg/200 mL premix  Status:  Discontinued     1,000 mg 200 mL/hr over 60 Minutes Intravenous Every 12 hours 01/20/16 0323 01/21/16 0900   01/20/16 0330  ceFEPIme (MAXIPIME) 1 g in dextrose 5 % 50 mL IVPB     1 g 100 mL/hr over 30 Minutes Intravenous  Once 01/20/16 0317 01/20/16 0735      Ovidio Kinavid Saranne Crislip, MD, FACS Pager: [phone] [facility] Office: [phone] 02/11/2016

## 2016-02-11 NOTE — Progress Notes (Addendum)
Occupational Therapy Treatment Patient Details Name: [name] MRN: [id number] DOB: 02-08-1941 Today's Date: 02/11/2016    History of present illness re admit [facility] 12/[facility].Dx N/V r/o ileus. S/P 02/06/16 forEXPLORATORY LAPAROTOMY, LYSIS OF ADHESIONS, SEGMENTAL SMALL BOWEL RESECTION, GASTROJEJUNOSTOMY 12/23   OT comments  Pt up in recliner after PT session. Pt provided with level 2 theraband for B UE there ex to increase strength and endurance for ADLs and functional tasks. Pt continues to have b UE edema and reviewed ROM for edema mgt as well as positioning in elevation. OT will continue to follow acutely  Follow Up Recommendations  SNF    Equipment Recommendations  3 in 1 bedside commode , TBD at next venue of care   Recommendations for Other Services      Precautions / Restrictions Precautions Precautions: Fall Precaution Comments: trach, gastric tube Restrictions Weight Bearing Restrictions: No       Mobility Bed Mobility Overal bed mobility: Needs Assistance Bed Mobility: Supine to Sit     Supine to sit: HOB elevated;+2 for physical assistance;+2 for safety/equipment;Max assist     General bed mobility comments: pt up in recliner upon arrival  Transfers Overall transfer level: Needs assistance Equipment used: Rolling walker (2 wheeled) Transfers: Stand Pivot Transfers;Sit to/from Stand Sit to Stand: Mod assist;+2 physical assistance;+2 safety/equipment;From elevated surface Stand pivot transfers: +2 safety/equipment;+2 physical assistance;Mod assist       General transfer comment: NT    Balance Overall balance assessment: Needs assistance Sitting-balance support: Feet supported;Bilateral upper extremity supported   Sitting balance - Comments: sitting in midline without support.   Standing balance support: During functional activity;Bilateral upper extremity supported Standing balance-Leahy Scale: Poor                     ADL        Grooming: Wash/dry face;Set up;Sitting                                        Vision  no change from baseline                              Cognition   Behavior During Therapy: WFL for tasks assessed/performed Overall Cognitive Status: Within Functional Limits for tasks assessed                       Extremity/Trunk Assessment   generalized weakness, B UE edema            Exercises Other Exercises Other Exercises: pt/family educated on B UE ther ex with level 2 t band and able to return demo with sup                Pertinent Vitals/ Pain       Pain Assessment: No/denies pain Faces Pain Scale: Hurts little more Pain Location: abdomen Pain Descriptors / Indicators: Discomfort;Grimacing Pain Intervention(s): Monitored during session  Home Living Family/patient expects to be discharged to:: Skilled nursing facility Living Arrangements: Spouse/significant other                                                    Frequency  Min 2X/week  Progress Toward Goals  OT Goals(current goals can now be found in the care plan section)  Progress towards OT goals: OT to reassess next treatment  ADL Goals Additional ADL Goal #1: Pt will participate in B UE ther ex 2 sets 10 reps with level 2 t band(orange) as instructed by OT)  Plan Discharge plan remains appropriate    nd of Session Equipment Utilized During Treatment: Other (comment) (theraband)   Activity Tolerance Patient tolerated treatment well   Patient Left in chair;with call bell/phone within reach;with family/visitor present              OT Time Calculation (min): 17 min  Charges: OT General Charges $OT Visit: 1 Procedure OT Treatments $Therapeutic Activity: 8-22 mins  [name], [name] [name] 02/11/2016, 2:35 PM

## 2016-02-11 NOTE — Care Management Note (Addendum)
Case Management Note  Patient Details  Name: VICTORHUGO PREIS MRN: [id number] Date of Birth: 1940-06-27  Subjective/Objective:                  EXPLORATORY LAPAROTOMY, LYSIS OF ADHESIONS, SEGMENTAL SMALL BOWEL RESECTION, GASTROJEJUNOSTOMY  Action/Plan: Discharge planning Expected Discharge Date:                  Expected Discharge Plan:  [place]  In-House Referral:     Discharge planning Services  CM Consult  Post Acute Care Choice:    Choice offered to:  Patient, Adult Children  DME Arranged:    DME Agency:     HH Arranged:    [place] Agency:     Status of Service:  In process, will continue to follow  If discussed at Long Length of Stay Meetings, dates discussed:    Additional Comments: CM met with pt and niece, Tito Dine, in room to discuss the possibility of [place] with [place] upon discharge IF pt goes home with home health.  Tito Dine states her Uncle, pt, has been to [facility] and family was very disappointed and will NOT be going back but realistically she states her Elenor Legato is 75yo and ill-equipped to accomplish the day to day care pt may need require at discharge so family is still expecting discharge to a SNF which can provide trach care.  However, if pt progresses to a pt where HH is a possiblity, family is very much appreciative to have [facility] provide [place] care; [place] rep Brenton Grills is aware and following for progress.  CM will continue to follow. Dellie Catholic, RN 02/11/2016, 3:59 PM

## 2016-02-11 NOTE — Progress Notes (Signed)
Pharmacy - Vancomycin  Assessment:  75 yoM on Vancomycin for postop leukocytosis.    VT due today at 1300 (dose due at 1400)  IV team unable to draw VT by 1600 today  Plan:  Will move trough to tomorrow as late VT would likely not be clinically helpful, especially if in the borderline high range  [name], PharmD, BCPS Pager: [phone] 02/11/2016, 3:43 PM

## 2016-02-12 ENCOUNTER — Inpatient Hospital Stay (HOSPITAL_COMMUNITY): Payer: Medicare Other

## 2016-02-12 LAB — CBC WITH DIFFERENTIAL/PLATELET
Basophils Absolute: 0 10*3/uL (ref 0.0–0.1)
Basophils Relative: 0 %
EOS ABS: 0.1 10*3/uL (ref 0.0–0.7)
EOS PCT: 1 %
HCT: 24.6 % — ABNORMAL LOW (ref 39.0–52.0)
Hemoglobin: 8 g/dL — ABNORMAL LOW (ref 13.0–17.0)
LYMPHS ABS: 2.9 10*3/uL (ref 0.7–4.0)
Lymphocytes Relative: 20 %
MCH: 29.4 pg (ref 26.0–34.0)
MCHC: 32.5 g/dL (ref 30.0–36.0)
MCV: 90.4 fL (ref 78.0–100.0)
MONO ABS: 1.7 10*3/uL — AB (ref 0.1–1.0)
MONOS PCT: 12 %
Neutro Abs: 9.9 10*3/uL — ABNORMAL HIGH (ref 1.7–7.7)
Neutrophils Relative %: 67 %
PLATELETS: 561 10*3/uL — AB (ref 150–400)
RBC: 2.72 MIL/uL — ABNORMAL LOW (ref 4.22–5.81)
RDW: 16.5 % — ABNORMAL HIGH (ref 11.5–15.5)
WBC: 14.6 10*3/uL — ABNORMAL HIGH (ref 4.0–10.5)

## 2016-02-12 LAB — BASIC METABOLIC PANEL
Anion gap: 6 (ref 5–15)
BUN: 26 mg/dL — AB (ref 6–20)
CHLORIDE: 103 mmol/L (ref 101–111)
CO2: 24 mmol/L (ref 22–32)
CREATININE: 0.77 mg/dL (ref 0.61–1.24)
Calcium: 8.2 mg/dL — ABNORMAL LOW (ref 8.9–10.3)
GFR calc Af Amer: >60 mL/min (ref >60)
Glucose, Bld: 111 mg/dL — ABNORMAL HIGH (ref 65–99)
Potassium: 3.9 mmol/L (ref 3.5–5.1)
SODIUM: 133 mmol/L — AB (ref 135–145)

## 2016-02-12 LAB — GLUCOSE, CAPILLARY
GLUCOSE-CAPILLARY: 115 mg/dL — AB (ref 65–99)
Glucose-Capillary: 124 mg/dL — ABNORMAL HIGH (ref 65–99)
Glucose-Capillary: 134 mg/dL — ABNORMAL HIGH (ref 65–99)
Glucose-Capillary: 112 mg/dL — ABNORMAL HIGH (ref 65–99)

## 2016-02-12 LAB — VANCOMYCIN, TROUGH: VANCOMYCIN TR: 25 ug/mL — AB (ref 15–20)

## 2016-02-12 LAB — MAGNESIUM: MAGNESIUM: 1.6 mg/dL — AB (ref 1.7–2.4)

## 2016-02-12 MED ORDER — VITAL 1.5 CAL PO LIQD
1000.0000 mL | ORAL | Status: DC
  Filled 2016-02-12: qty 1000

## 2016-02-12 MED ORDER — VITAL 1.5 CAL PO LIQD: 1000.0000 mL | ORAL | Status: DC

## 2016-02-12 MED ORDER — TRACE MINERALS CR-CU-MN-SE-ZN 10-1000-500-60 MCG/ML IV SOLN
INTRAVENOUS | Status: AC
  Administered 2016-02-12: 18:00:00 via INTRAVENOUS
  Filled 2016-02-12: qty 2000

## 2016-02-12 MED ORDER — IOPAMIDOL (ISOVUE-300) INJECTION 61%: 30.0000 mL | Freq: Once | INTRAVENOUS | Status: DC | PRN

## 2016-02-12 MED ORDER — IOPAMIDOL (ISOVUE-300) INJECTION 61%
30.0000 mL | Freq: Once | INTRAVENOUS | Status: AC | PRN
Start: 2016-02-12 — End: 2016-02-12
  Administered 2016-02-12: 30 mL via ORAL

## 2016-02-12 MED ORDER — IOPAMIDOL (ISOVUE-300) INJECTION 61%
100.0000 mL | Freq: Once | INTRAVENOUS | Status: AC | PRN
  Administered 2016-02-12: 100 mL via INTRAVENOUS

## 2016-02-12 MED ORDER — SODIUM CHLORIDE 0.9 % IV SOLN
1500.0000 mg | INTRAVENOUS | Status: DC
  Administered 2016-02-12 – 2016-02-15 (×4): 1500 mg via INTRAVENOUS
  Filled 2016-02-12 (×5): qty 1500

## 2016-02-12 MED ORDER — FAT EMULSION 20 % IV EMUL
240.0000 mL | INTRAVENOUS | Status: AC
  Administered 2016-02-12: 240 mL via INTRAVENOUS
  Filled 2016-02-12: qty 250

## 2016-02-12 MED ORDER — MAGNESIUM SULFATE IN D5W 1-5 GM/100ML-% IV SOLN
1.0000 g | Freq: Once | INTRAVENOUS | Status: AC
  Administered 2016-02-12: 1 g via INTRAVENOUS
  Filled 2016-02-12: qty 100

## 2016-02-12 NOTE — Progress Notes (Signed)
PHARMACY - ADULT TOTAL PARENTERAL NUTRITION CONSULT NOTE   Pharmacy Consult for TPN Indication: PTA TPN continuation, intolerance to enteral feed  Insulin Requirements:  SSI:  1 units Novolog sensitive SSI in past 24 hours TPN: 10 units regular insulin in TPN bag  Current Nutrition:  - Clinimix as below  - TFs increased to 20 ml/hr per CCS today  IVF: off  Central access: PICC TPN start date: PTA, restarted on admission on 12/6    Recent Labs  02/11/16 0323 02/12/16 0346  NA 136 133*  K 3.9 3.9  CL 107 103  CO2 24 24  GLUCOSE 143* 111*  BUN 27* 26*  CREATININE 0.76 0.77  CALCIUM 8.2* 8.2*  PHOS 3.8  --   MG 1.7 1.6*  ALBUMIN 1.2*  --   ALKPHOS 280*  --   AST 28  --   ALT 23  --   BILITOT 0.7  --   Corr Ca: 10 on 12/25  ASSESSMENT                                                                                                          HPI: 70 yoM with hx PUD, CAD, GERD, BPH,  s/p multiple procedures  and extended hospitalization for recurrent partial GOO 2/2 pyloric stricture on TPN for several days, discharged 11/15 with J/G tube in place on TFs, unable to tolerate and had to transition back to TPN via [facility] on 11/22, readmitted 12/6 with partial SBO. Pharmacy consulted to resume TPN. Note: PTA TNA from [place] was as follows: Totals per day: 405 grams (1376 kcal)  of dextrose, 75 grams (300 kcal) of protein and 50 grams ( 480 kcal) of lipid for a total of 2156 kcal/day   Significant events:  12/6: TPN continued on admission.  12/7: Pt pulled out NGT, not replaced, remains NPO 12/11: Awaiting MBS, if no aspiration, will start to advance diet.  Having bowel function.  Continue TPN for now.   12/12: Severe aspiration risk per MBS yesterday - recommend continuing NPO. Osmolite 1.2 CAL via TF BID ordered 12/11 at 6pm. Per discussion with CCS PA, continue TPN at current goal rate today, wean tomorrow if tolerates TF. 12/13: Did not tolerate bolus feeds through  G-tube; overnight emesis and aspiration event, started on Zosyn. CCS trying trickle feeds today. 12/14 transition to cyclic TPN - appears recovery may be prolonged. Not tolerating trickle feeds; G-tube to drainage.  12/15: TPN was not run at cyclic rate overnight d/t conflicting information in TPN order. To provide patient full needed nutrition, will continue to run TPN at 83 ml/hr over 24 hr and begin 18-hr cycle with tonight's TPN 12/16: start 12h TPN cycle 12/18: resume trickle tube feed at 5 ml/hr 12/19: Nausea/vomiting, tube feed held. TPN formula changed to E 5/20 from E 5/15 12/21: successful transition of G tube to [place] tube. Plan scan for patency before resuming tube feeds.  12/22: Upper GI scan in IR today to ensure J-tube working.  12/23: exp lap, lysis of adhesions, segmental  small bowel resection, gastrojejunostomy 12/25: TPN converted from cyclic back to continuous due to hypoglycemic event following end of TPN cycle and patient likely to remain hospitalized for at least several more days. 12/28: to start trickle feeds thru J-tube; G-tube remains on drainage 12/29: TF increased from 10 ml/hr to 20 ml/hr   Today, 02/12/2016:   Glucose (goal 100-150):  No hx DM; CBGs within goal range 106-134.   Electrolytes: Na slightly low, Mag low at 1.6, others WNL  Renal - SCr WNL, BUN elevated but decreasing.    LFTs:  Alk phos now elevated (12/28), previously WNL.  Albumin low, AST/ALT, Tbili WNL (12/28).  TGs: 177 (11/27), 93 (12/7), 119 (12/11) 148 (12/18); 134 (12/25)  Prealbumin:  15.3 ( 11/13), 10.8 (12/7), 12.8 (12/11), 13.1 (12/18), 8.9 (12/25) - falling  NUTRITIONAL GOALS                                                                                             RD recs (12/26): Kcal 2000-2200, Protein 100-115g, Fluid 2L/day [facility] TPN: 2156 Kcal/day, Protein 75g/day  Clinimix E 5/20 at a goal rate of 83 ml/hr + 20% fat emulsion at 10 ml/hr to provide: 100 g/day protein, 2240  Kcal/day. (+++There is currently a Psychologist, prison and probation services of Clinimix solution.  Pharmacy will use Clinimix product based on what we have available in stock+++)  PLAN                                                                                    Magnesium Sulfate 1g IV x 1                                         At 1800 today:  Per discussion with Dr. Lucia Gaskins, plan is to continue TPN at current rate today, but okay to start weaning in AM if tolerates increase in TF rate today.   Continue Clinimix E 5/20 at 83 ml/hr.  Continue 20% fat emulsion at 10 ml/hr.  Continue 10 units regular insulin in TPN 2L bag.   TPN to contain standard multivitamins and trace elements.  Continue sensitive SSI and CBG checks q6h.   TPN lab panels on Mondays & Thursdays.  BMET, Mag, Phos in AM.  F/u daily.  Follow-up ability to start weaning TPN on 12/30.   Lindell Spar, PharmD, BCPS Pager: [phone] 02/12/2016 1:58 PM

## 2016-02-12 NOTE — Progress Notes (Signed)
Upon assessment this morning, midline incision dressing was clean dry and intact. G tube to gravity was draining well. At 1500 when patient wanted to get back to bed from chair, patient stood and RN noticed dripping of something onto floor. When back to bed RN checked dressing and it was saturated. Removed dressing and noted bowel contents coming from incision. MD paged and came to see patient after OR. MD assessed and entered order for CT and to stop tube feedings. Clean dressing applied. Will continue to monitor.   Julio SicksK. Hobert Poplaski RN

## 2016-02-12 NOTE — Progress Notes (Signed)
Physical Therapy Treatment Patient Details Name: [name] MRN: [id number] DOB: May 31, 1940 Today's Date: 02/12/2016    [facility] of Present Ill[facility]Dx N/V r/o ileus. S/P 02/06/16 forEXPLORATORY LAPAROTOMY, LYSIS OF ADHESIONS, SEGMENTAL SMALL BOWEL RESECTION, GASTROJEJUNOSTOMY 12/23    PT Comments    The patient did  Progress to ambulating x 6' today. Slowly [progressing. May try to EVA walker next visit.  Follow Up Recommendations  SNF;Supervision/Assistance - 24 hour;Home health PT (depends on family descision)     Equipment Recommendations   (if DC to home , [facility]    Recommendations for Other Services       Precautions / Restrictions Precautions Precautions: Fall Precaution Comments: trach, gastric tube    Mobility  Bed Mobility Overal bed mobility: Needs Assistance Bed Mobility: Supine to Sit     Supine to sit: Min assist;HOB elevated     General bed mobility comments: extra time, assist with the trunk  Transfers Overall transfer level: Needs assistance Equipment used: Rolling walker (2 wheeled) Transfers: Sit to/from Stand Sit to Stand: Min assist;+2 safety/equipment         General transfer comment: staedy assist to rise, patient powered himself up.  Ambulation/Gait Ambulation/Gait assistance: Min assist;+2 safety/equipment Ambulation Distance (Feet): 6 Feet Assistive device: Rolling walker (2 wheeled) Gait Pattern/deviations: Step-to pattern     General Gait Details: patient was very shakey and appeared apprehensive when  ambulating. Recliner pulled up. Patient on RA with oxygen sats  >96%. HR 99, Replaced on TC.   Stairs            Wheelchair Mobility    Modified Rankin (Stroke Patients Only)       Balance                                    Cognition Arousal/Alertness: Awake/alert Behavior During Therapy: Flat affect;WFL for tasks assessed/performed                         Exercises      General Comments        Pertinent Vitals/Pain Faces Pain Scale: Hurts little more Pain Location: back Pain Descriptors / Indicators: Aching Pain Intervention(s): Premedicated before session;Repositioned    Home Living                      Prior Function            PT Goals (current goals can now be found in the care plan section) Progress towards PT goals: Progressing toward goals    Frequency    Min 3X/week      PT Plan Current plan remains appropriate    Co-evaluation             End of Session Equipment Utilized During Treatment: Gait belt Activity Tolerance: Patient limited by fatigue Patient left: in chair;with call bell/phone within reach;with chair alarm set;with family/visitor present     Time: 1130-1210 PT Time Calculation (min) (ACUTE ONLY): 40 min  Charges:  $Gait Training: 23-37 mins                    G Codes:      [name], [name] 02/12/2016, 2:25 PM [name] PT [phone]

## 2016-02-12 NOTE — Progress Notes (Signed)
Patient ID: Danny GalRalph T [name], male   DOB: 01/26/1941, 75 y.o.   MRN: [id number]  [facility]  6 Days Post-Op  Subjective: Jejunal TF started yesterday 1800 at 3810mL/hr. Tolerating well thus far. Patient reports mild increase in abdominal pain, but he as no bloating, nausea, or vomiting.  He has not had a BM in several days. States that he has very little energy. He does not feel like he can get out of bed today. Continues to have a mild cough. Denies CP or SOB.  Objective: Vital signs in last 24 hours: Temp:  [98 F (36.7 C)-98.6 F (37 C)] 98.2 F (36.8 C) (12/29 0424) Pulse Rate:  [78-96] 87 (12/29 0858) Resp:  [14-16] 16 (12/29 0858) BP: (131-151)/(53-72) 151/72 (12/29 0424) SpO2:  [94 %-100 %] 100 % (12/29 0900) FiO2 (%):  [28 %] 28 % (12/29 0900) Weight:  [184 lb 4.9 oz (83.6 kg)] 184 lb 4.9 oz (83.6 kg) (12/29 0424) Last BM Date: 02/08/16  Intake/Output from previous day: 12/28 0701 - 12/29 0700 In: 1791.4 [I.V.:1122; NG/GT:119.3; IV Piggyback:550] Out: 2200 [Urine:1550; Drains:650] Intake/Output this shift: No intake/output data recorded.  PE: Gen:  Alert, NAD, pleasant Card:  RRR, no M/G/R heard Pulm:  CTAB, no W/R/R, effort normal Abd: Soft, mild global tenderness, ND, hypoactive BS, open midline incision with good granulation tissue and trace serous drainage pooling at base of wound Ext:  Pitting edema BLE   Lab Results:   Recent Labs  02/11/16 0323 02/12/16 0346  WBC 14.5* 14.6*  HGB 7.8* 8.0*  HCT 23.8* 24.6*  PLT 509* 561*   BMET  Recent Labs  02/11/16 0323 02/12/16 0346  NA 136 133*  K 3.9 3.9  CL 107 103  CO2 24 24  GLUCOSE 143* 111*  BUN 27* 26*  CREATININE 0.76 0.77  CALCIUM 8.2* 8.2*   PT/INR No results for input(s): LABPROT, INR in the last 72 hours. CMP     Component Value Date/Time   NA 133 (L) 02/12/2016 0346   NA 139 01/14/2016   K 3.9 02/12/2016 0346   CL 103 02/12/2016 0346   CO2 24 02/12/2016  0346   GLUCOSE 111 (H) 02/12/2016 0346   BUN 26 (H) 02/12/2016 0346   BUN 23 (A) 01/14/2016   CREATININE 0.77 02/12/2016 0346   CALCIUM 8.2 (L) 02/12/2016 0346   PROT 5.1 (L) 02/11/2016 0323   ALBUMIN 1.2 (L) 02/11/2016 0323   AST 28 02/11/2016 0323   ALT 23 02/11/2016 0323   ALKPHOS 280 (H) 02/11/2016 0323   BILITOT 0.7 02/11/2016 0323   GFRNONAA >60 02/12/2016 0346   GFRAA >60 02/12/2016 0346   Lipase     Component Value Date/Time   LIPASE 55 (H) 01/20/2016 0158       Studies/Results: Dg Abd 1 View  Result Date: 02/10/2016 CLINICAL DATA:  Jejunostomy tube placement EXAM: ABDOMEN - 1 VIEW COMPARISON:  02/06/2016 FINDINGS: Contrast is seen within decompressed small bowel loops. No evidence of contrast extravasation or bowel obstruction. Prior cholecystectomy. IMPRESSION: Contrast seen within decompressed small bowel loops. No contrast extravasation. Electronically Signed   By: Charlett NoseKevin  Dover M.D.   On: 02/10/2016 18:37    Anti-infectives: Anti-infectives    Start     Dose/Rate Route Frequency Ordered Stop   02/09/16 0200  vancomycin (VANCOCIN) IVPB 1000 mg/200 mL premix     1,000 mg 200 mL/hr over 60 Minutes Intravenous Every 12 hours 02/08/16 1300     02/08/16 1400  vancomycin (VANCOCIN) IVPB 1000 mg/200 mL premix  Status:  Discontinued     1,000 mg 200 mL/hr over 60 Minutes Intravenous Every 12 hours 02/08/16 1259 02/08/16 1300   02/08/16 1300  vancomycin (VANCOCIN) IVPB 1000 mg/200 mL premix     1,000 mg 200 mL/hr over 60 Minutes Intravenous  Once 02/08/16 1217 02/08/16 1336   02/08/16 1200  piperacillin-tazobactam (ZOSYN) IVPB 3.375 g     3.375 g 12.5 mL/hr over 240 Minutes Intravenous Every 8 hours 02/08/16 1136     02/06/16 1415  piperacillin-tazobactam (ZOSYN) IVPB 3.375 g     3.375 g 100 mL/hr over 30 Minutes Intravenous  Once 02/06/16 1413 02/06/16 1409   01/26/16 2300  piperacillin-tazobactam (ZOSYN) IVPB 3.375 g  Status:  Discontinued     3.375 g 12.5 mL/hr  over 240 Minutes Intravenous Every 8 hours 01/26/16 2256 02/04/16 0905   01/26/16 2230  vancomycin (VANCOCIN) IVPB 1000 mg/200 mL premix  Status:  Discontinued     1,000 mg 200 mL/hr over 60 Minutes Intravenous Every 12 hours 01/26/16 2227 01/26/16 2227   01/20/16 0400  vancomycin (VANCOCIN) IVPB 1000 mg/200 mL premix  Status:  Discontinued     1,000 mg 200 mL/hr over 60 Minutes Intravenous Every 12 hours 01/20/16 0323 01/21/16 0900   01/20/16 0330  ceFEPIme (MAXIPIME) 1 g in dextrose 5 % 50 mL IVPB     1 g 100 mL/hr over 30 Minutes Intravenous  Once 01/20/16 0317 01/20/16 0735       Assessment/Plan Previous V&A with BI anastomosis - 09/25/2015 - Gross EXPLORATORY LAPAROTOMY, LYSIS OF ADHESIONS, SEGMENTAL SMALL BOWEL RESECTION, GASTROJEJUNOSTOMY - 02/06/2016 - Dr. Carolynne Edouardoth - POD 6 - WBC 14.6, afebrile. Continue zosyn and vanc. - tolerating jejunal TF at 3510mL/hr  Longstanding trach - per CCM consider decannulation when patient is improved from a physical conditioning standpoint Malnutrition - on TPN - Prealbumin 8.9 (02/08/2016) Anemia - Hg 8.0 (02/12/16) Two runs of V tach on 12/27 - no further issues  ID - zosyn and vanc 12/25>> VTE - heparin FEN - IVF, TPN, trickle jejunal TF 7720mL/hr  Plan - increase jejunal TF to 3920mL/hr. Magnesium being replaced. Continue BID wet to dry dressing changes (please ensure kerlex is pushed fully down into the base of the wound). Continue antibiotics for now. May need to add suppository if he does not have BM. Patient still lacking motivation to move; appreciate all the motivation.   LOS: 23 days    Edson SnowballBROOKE A MILLER , [facility] 02/12/2016, 9:55 AM Pager: [phone] Consults: [phone] Mon-Fri 7:00 am-4:30 pm Sat-Sun 7:00 am-11:30 am  Agree with above. He has tolerated the j tube feedings to 10 cc per hour.  Will plan to increase this to 20 cc per hour today.  And will keep increasing the TF until goal reached.  His wife  is in the room The big thing he needs is increase physical activity and motivation.  Ovidio Kinavid Roddrick Sharron, MD, [facility] Pager: [phone] Office phone:  [phone]

## 2016-02-12 NOTE — Progress Notes (Signed)
Pharmacy Antibiotic Note  [name] is a 75 y.o. male well-known to pharmacy who is s/p multiple procedures and an extended hospitalization for recurrent partial GOO, readmitted on 01/19/2016 with partial SBO and possible aspiration PNA.  Pharmacy consulted to resume Vancomycin and Zosyn dosing on 12/25 (POD #2 from exploratory laparotomy, lysis of adhesions, segmental small bowel resection, gastrojejunostomy) as empiric coverage for new leukocytosis.  D5 resumption of IV abx WBC remains elevated, but improved, 32K > 14.6K Afebrile Vancomycin trough level = 25 mcg/mL, supratherapeutic  Plan:  Continue .Zosyn 3.375gm IV q8h (4hr extended infusions).  Adjust Vancomcyin to 1500mg  IV q24h per elevated trough level above.  Plan for Vancomycin trough level at new steady state, as indicated.  Monitor renal function, cultures, clinical course, ability to de-escalate antibiotic therapy.   Height: 6\' 2"  (188 cm) Weight: 184 lb 4.9 oz (83.6 kg) IBW/kg (Calculated) : 82.2  Temp (24hrs), Avg:98 F (36.7 C), Min:97.9 F (36.6 C), Max:98.2 F (36.8 C)   Recent Labs Lab 02/08/16 0425 02/09/16 0429 02/10/16 0405 02/11/16 0323 02/12/16 0346 02/12/16 1256  WBC 32.0* 21.6*  --  14.5* 14.6*  --   CREATININE 1.00 0.87 0.70 0.76 0.77  --   VANCOTROUGH  --   --   --   --   --  25*    Estimated Creatinine Clearance: 91.3 mL/min (by C-G formula based on SCr of 0.77 mg/dL).    Allergies  Allergen Reactions  . Flomax [Tamsulosin Hcl] Other (See Comments)    Leg weakness  . Lisinopril Cough  . Lorazepam     "i don't like it"  Prefers Xanax or valium  . Uroxatral [Alfuzosin Hcl Er] Other (See Comments)    Leg weakness    Antimicrobials this admission:  12/6 Cefepime >> 12/6 12/6 Vancomycin >> 12/7, resume 12/25 >> 12/12 Zosyn >> 12/21, resume 12/25 >>  Microbiology results: 12/7 influenza A/B negative  12/23 MRSA PCR (-)  Thank you for allowing pharmacy to be a part of this  patient's care.   Greer PickerelJigna Monseratt Ledin, PharmD, BCPS Pager: [phone] 02/12/2016 4:27 PM

## 2016-02-12 NOTE — Progress Notes (Signed)
Called by nursing about drainage from abdominal wound. Wife, and daughter, [name], at bedside.  The patient has enteric contents coming out of the midline incision.  He looks surprisingly comfortable.  Will obtain CT scan and give limited contrast through the g tube to see if we can identify a location of the leak and to make sure that there is not a large intra-abdominal collection. To increase dressing changes to q 4 hours.  Discussed with family.  Discussed that Drs. Daphine DeutscherMartin and Dwain SarnaWakefield are on this weekend and will follow patient.  Ovidio Kinavid Jacques Willingham, MD, [facility] Pager: [phone] Office phone:  [phone]

## 2016-02-13 DIAGNOSIS — K632 Fistula of intestine: Secondary | ICD-10-CM

## 2016-02-13 HISTORY — DX: Fistula of intestine: K63.2

## 2016-02-13 LAB — CBC WITH DIFFERENTIAL/PLATELET
BASOS PCT: 0 %
Basophils Absolute: 0 10*3/uL (ref 0.0–0.1)
EOS PCT: 1 %
Eosinophils Absolute: 0.2 10*3/uL (ref 0.0–0.7)
HEMATOCRIT: 23.6 % — AB (ref 39.0–52.0)
Hemoglobin: 7.7 g/dL — ABNORMAL LOW (ref 13.0–17.0)
Lymphocytes Relative: 19 %
Lymphs Abs: 2.9 10*3/uL (ref 0.7–4.0)
MCH: 28.8 pg (ref 26.0–34.0)
MCHC: 32.6 g/dL (ref 30.0–36.0)
MCV: 88.4 fL (ref 78.0–100.0)
MONOS PCT: 13 %
Monocytes Absolute: 2 10*3/uL — ABNORMAL HIGH (ref 0.1–1.0)
Neutro Abs: 10.4 10*3/uL — ABNORMAL HIGH (ref 1.7–7.7)
Neutrophils Relative %: 67 %
Platelets: 557 10*3/uL — ABNORMAL HIGH (ref 150–400)
RBC: 2.67 MIL/uL — AB (ref 4.22–5.81)
RDW: 16.4 % — AB (ref 11.5–15.5)
WBC: 15.5 10*3/uL — ABNORMAL HIGH (ref 4.0–10.5)

## 2016-02-13 LAB — COMPREHENSIVE METABOLIC PANEL
ALT: 32 U/L (ref 17–63)
AST: 30 U/L (ref 15–41)
Albumin: 1.2 g/dL — ABNORMAL LOW (ref 3.5–5.0)
Alkaline Phosphatase: 257 U/L — ABNORMAL HIGH (ref 38–126)
Anion gap: 6 (ref 5–15)
BUN: 25 mg/dL — ABNORMAL HIGH (ref 6–20)
CHLORIDE: 103 mmol/L (ref 101–111)
CO2: 25 mmol/L (ref 22–32)
CREATININE: 0.68 mg/dL (ref 0.61–1.24)
Calcium: 8 mg/dL — ABNORMAL LOW (ref 8.9–10.3)
GFR calc non Af Amer: >60 mL/min (ref >60)
Glucose, Bld: 112 mg/dL — ABNORMAL HIGH (ref 65–99)
POTASSIUM: 3.8 mmol/L (ref 3.5–5.1)
SODIUM: 134 mmol/L — AB (ref 135–145)
Total Bilirubin: 0.3 mg/dL (ref 0.3–1.2)
Total Protein: 5.4 g/dL — ABNORMAL LOW (ref 6.5–8.1)

## 2016-02-13 LAB — PHOSPHORUS: Phosphorus: 3.9 mg/dL (ref 2.5–4.6)

## 2016-02-13 LAB — GLUCOSE, CAPILLARY
GLUCOSE-CAPILLARY: 109 mg/dL — AB (ref 65–99)
GLUCOSE-CAPILLARY: 114 mg/dL — AB (ref 65–99)
GLUCOSE-CAPILLARY: 122 mg/dL — AB (ref 65–99)
Glucose-Capillary: 124 mg/dL — ABNORMAL HIGH (ref 65–99)

## 2016-02-13 LAB — PROTIME-INR
INR: 1.18
Prothrombin Time: 15 seconds (ref 11.4–15.2)

## 2016-02-13 LAB — MAGNESIUM: Magnesium: 1.8 mg/dL (ref 1.7–2.4)

## 2016-02-13 MED ORDER — TRACE MINERALS CR-CU-MN-SE-ZN 10-1000-500-60 MCG/ML IV SOLN
INTRAVENOUS | Status: AC
  Administered 2016-02-13: 18:00:00 via INTRAVENOUS
  Filled 2016-02-13: qty 2000

## 2016-02-13 MED ORDER — FAT EMULSION 20 % IV EMUL
240.0000 mL | INTRAVENOUS | Status: AC
  Administered 2016-02-13: 240 mL via INTRAVENOUS
  Filled 2016-02-13: qty 250

## 2016-02-13 NOTE — Progress Notes (Signed)
Patient ID: [name], male   DOB: 1940/12/13, 75 y.o.   MRN: [id number] [facility] Surgery Progress Note:   7 Days Post-Op  Subjective: Mental status is clear; pleasant.  Discussed findings with wife and daughter.   Objective: Vital signs in last 24 hours: Temp:  [97.9 F (36.6 C)-98.3 F (36.8 C)] 98.2 F (36.8 C) (12/30 0513) Pulse Rate:  [71-107] 87 (12/30 0513) Resp:  [16-20] 20 (12/30 0513) BP: (125-139)/(50-55) 125/53 (12/30 0513) SpO2:  [95 %-100 %] 96 % (12/30 0513) FiO2 (%):  [28 %] 28 % (12/29 2314) Weight:  [83.3 kg (183 lb 10.3 oz)] 83.3 kg (183 lb 10.3 oz) (12/30 0513)  Intake/Output from previous day: 12/29 0701 - 12/30 0700 In: 3002.7 [I.V.:2232; NG/GT:115.7; IV Piggyback:655] Out: 2575 [FUXNA:3557; Drains:900] Intake/Output this shift: No intake/output data recorded.  Physical Exam: Work of breathing is not labored; trach in place.  Incision covered but his is site that tube feedings were draining.    Lab Results:  Results for orders placed or performed during the hospital encounter of 01/19/16 (from the past 48 hour(s))  Glucose, capillary     Status: Abnormal   Collection Time: 02/11/16 12:17 PM  Result Value Ref Range   Glucose-Capillary 106 (H) 65 - 99 mg/dL   Comment 1 Notify RN    Comment 2 Document in Chart   Glucose, capillary     Status: Abnormal   Collection Time: 02/11/16  5:45 PM  Result Value Ref Range   Glucose-Capillary 112 (H) 65 - 99 mg/dL   Comment 1 Notify RN    Comment 2 Document in Chart   Glucose, capillary     Status: Abnormal   Collection Time: 02/12/16 12:15 AM  Result Value Ref Range   Glucose-Capillary 115 (H) 65 - 99 mg/dL   Comment 1 Notify RN   CBC with Differential/Platelet     Status: Abnormal   Collection Time: 02/12/16  3:46 AM  Result Value Ref Range   WBC 14.6 (H) 4.0 - 10.5 K/uL   RBC 2.72 (L) 4.22 - 5.81 MIL/uL   Hemoglobin 8.0 (L) 13.0 - 17.0 g/dL   HCT 24.6 (L) 39.0 - 52.0 %   MCV 90.4 78.0 - 100.0  fL   MCH 29.4 26.0 - 34.0 pg   MCHC 32.5 30.0 - 36.0 g/dL   RDW 16.5 (H) 11.5 - 15.5 %   Platelets 561 (H) 150 - 400 K/uL   Neutrophils Relative % 67 %   Neutro Abs 9.9 (H) 1.7 - 7.7 K/uL   Lymphocytes Relative 20 %   Lymphs Abs 2.9 0.7 - 4.0 K/uL   Monocytes Relative 12 %   Monocytes Absolute 1.7 (H) 0.1 - 1.0 K/uL   Eosinophils Relative 1 %   Eosinophils Absolute 0.1 0.0 - 0.7 K/uL   Basophils Relative 0 %   Basophils Absolute 0.0 0.0 - 0.1 K/uL  Basic metabolic panel     Status: Abnormal   Collection Time: 02/12/16  3:46 AM  Result Value Ref Range   Sodium 133 (L) 135 - 145 mmol/L   Potassium 3.9 3.5 - 5.1 mmol/L   Chloride 103 101 - 111 mmol/L   CO2 24 22 - 32 mmol/L   Glucose, Bld 111 (H) 65 - 99 mg/dL   BUN 26 (H) 6 - 20 mg/dL   Creatinine, Ser 0.77 0.61 - 1.24 mg/dL   Calcium 8.2 (L) 8.9 - 10.3 mg/dL   GFR calc non Af Amer >60 >60  mL/min   GFR calc Af Amer >60 >60 mL/min    Comment: (NOTE) The eGFR has been calculated using the CKD EPI equation. This calculation has not been validated in all clinical situations. eGFR's persistently <60 mL/min signify possible Chronic Kidney Disease.    Anion gap 6 5 - 15  Magnesium     Status: Abnormal   Collection Time: 02/12/16  3:46 AM  Result Value Ref Range   Magnesium 1.6 (L) 1.7 - 2.4 mg/dL  Glucose, capillary     Status: Abnormal   Collection Time: 02/12/16  5:21 AM  Result Value Ref Range   Glucose-Capillary 124 (H) 65 - 99 mg/dL  Glucose, capillary     Status: Abnormal   Collection Time: 02/12/16 12:14 PM  Result Value Ref Range   Glucose-Capillary 134 (H) 65 - 99 mg/dL  Vancomycin, trough     Status: Abnormal   Collection Time: 02/12/16 12:56 PM  Result Value Ref Range   Vancomycin Tr 25 (HH) 15 - 20 ug/mL    Comment: CRITICAL RESULT CALLED TO, READ BACK BY AND VERIFIED WITH: PHILLIPS,K. RN AT 1340 02/12/16 MULLINS,T   Glucose, capillary     Status: Abnormal   Collection Time: 02/12/16  6:18 PM  Result Value  Ref Range   Glucose-Capillary 112 (H) 65 - 99 mg/dL  Glucose, capillary     Status: Abnormal   Collection Time: 02/13/16 12:13 AM  Result Value Ref Range   Glucose-Capillary 114 (H) 65 - 99 mg/dL  Magnesium     Status: None   Collection Time: 02/13/16  3:30 AM  Result Value Ref Range   Magnesium 1.8 1.7 - 2.4 mg/dL  Phosphorus     Status: None   Collection Time: 02/13/16  3:30 AM  Result Value Ref Range   Phosphorus 3.9 2.5 - 4.6 mg/dL  CBC with Differential/Platelet     Status: Abnormal   Collection Time: 02/13/16  3:30 AM  Result Value Ref Range   WBC 15.5 (H) 4.0 - 10.5 K/uL   RBC 2.67 (L) 4.22 - 5.81 MIL/uL   Hemoglobin 7.7 (L) 13.0 - 17.0 g/dL   HCT 23.6 (L) 39.0 - 52.0 %   MCV 88.4 78.0 - 100.0 fL   MCH 28.8 26.0 - 34.0 pg   MCHC 32.6 30.0 - 36.0 g/dL   RDW 16.4 (H) 11.5 - 15.5 %   Platelets 557 (H) 150 - 400 K/uL   Neutrophils Relative % 67 %   Lymphocytes Relative 19 %   Monocytes Relative 13 %   Eosinophils Relative 1 %   Basophils Relative 0 %   Neutro Abs 10.4 (H) 1.7 - 7.7 K/uL   Lymphs Abs 2.9 0.7 - 4.0 K/uL   Monocytes Absolute 2.0 (H) 0.1 - 1.0 K/uL   Eosinophils Absolute 0.2 0.0 - 0.7 K/uL   Basophils Absolute 0.0 0.0 - 0.1 K/uL   Smear Review MORPHOLOGY UNREMARKABLE   Comprehensive metabolic panel     Status: Abnormal   Collection Time: 02/13/16  3:30 AM  Result Value Ref Range   Sodium 134 (L) 135 - 145 mmol/L   Potassium 3.8 3.5 - 5.1 mmol/L   Chloride 103 101 - 111 mmol/L   CO2 25 22 - 32 mmol/L   Glucose, Bld 112 (H) 65 - 99 mg/dL   BUN 25 (H) 6 - 20 mg/dL   Creatinine, Ser 0.68 0.61 - 1.24 mg/dL   Calcium 8.0 (L) 8.9 - 10.3 mg/dL   Total Protein 5.4 (  L) 6.5 - 8.1 g/dL   Albumin 1.2 (L) 3.5 - 5.0 g/dL   AST 30 15 - 41 U/L   ALT 32 17 - 63 U/L   Alkaline Phosphatase 257 (H) 38 - 126 U/L   Total Bilirubin 0.3 0.3 - 1.2 mg/dL   GFR calc non Af Amer >60 >60 mL/min   GFR calc Af Amer >60 >60 mL/min    Comment: (NOTE) The eGFR has been  calculated using the CKD EPI equation. This calculation has not been validated in all clinical situations. eGFR's persistently <60 mL/min signify possible Chronic Kidney Disease.    Anion gap 6 5 - 15  Glucose, capillary     Status: Abnormal   Collection Time: 02/13/16  5:12 AM  Result Value Ref Range   Glucose-Capillary 124 (H) 65 - 99 mg/dL    Radiology/Results: Ct Abdomen Pelvis W Contrast  Addendum Date: 02/12/2016   ADDENDUM REPORT: 02/12/2016 20:07 ADDENDUM: The salient findings were discussed with Dr. Hassell Done on 02/12/2016 at 7:55 p.m. Electronically Signed   By: Nolon Nations M.D.   On: 02/12/2016 20:07   Result Date: 02/12/2016 CLINICAL DATA:  Drainage from abdominal wound. Enteric contents coming out of midline incision. EXAM: CT ABDOMEN AND PELVIS WITH CONTRAST TECHNIQUE: Multidetector CT imaging of the abdomen and pelvis was performed using the standard protocol following bolus administration of intravenous contrast. CONTRAST:  28m ISOVUE-300 IOPAMIDOL (ISOVUE-300) INJECTION 61%, 1023mISOVUE-300 IOPAMIDOL (ISOVUE-300) INJECTION 61% COMPARISON:  02/10/2016, 01/20/2016 FINDINGS: Lower chest: There are bilateral pleural effusions, similar to the prior study. Bibasilar atelectasis is present. Heart size is normal. No imaged pericardial effusion or significant coronary artery calcifications. Hepatobiliary: Status post cholecystectomy. Mild biliary duct dilatation. Pancreas: Unremarkable. No pancreatic ductal dilatation or surrounding inflammatory changes. Spleen: Normal in size without focal abnormality. Adrenals/Urinary Tract: Adrenal glands are normal. There is mild prominence of the collecting systems of the kidneys bilaterally, likely related to the distended urinary bladder. No urinary tract calculi are identified. Stomach/Bowel: There is a percutaneous feeding tube in place, injuring the stomach through the left anterior abdominal wall. Status post antrectomy of the stomach and  gastric duodenostomy. The feeding tube is intraluminal in a right upper quadrant jejunal loop. Sutures are identified within the region region of the gastroduodenal ostomy. Small locules of gas are identified within this region and are contiguous with the anterior abdominal wall wound. Small amounts of contrast are identified adjacent to the suture line in this region, and the surgical anastomosis is the most likely site of perforation. The colon is collapsed. There is mild thickening of colonic wall. Contrast is identified in the ascending colon following prior injection of contrast. Vascular/Lymphatic: Dense atherosclerotic calcification of the abdominal aorta. Reproductive: Prostate gland and seminal vesicles are normal in appearance. Distended urinary bladder. Bladder contains gas in may indicate recent catheterization. Other: Within the left lower quadrant, there is a rim enhancing collection which measures 4.4 x 10.2 x 16.7 cm. This collection abuts the anterior abdominal wall at the level of the iliac crest. Smaller similar collections are also identified throughout the pelvis. On the right, collection adjacent to the cecum is 4.9 x 4.3 cm. Small collection in the left mid abdomen is 2.9 x 5.0 cm. Adjacent to the sigmoid colon, there are small amounts of fluid containing contrast, consistent with bowel perforation. Small amounts of free air are also present along the anterior abdominal wall. Musculoskeletal: No acute or significant osseous findings. IMPRESSION: 1. Interval development of multiple rim enhancing  collections consistent with abscesses. 2. Evidence for perforation adjacent to the gastrojejunostomy suture line. 3. Small amounts of fluid and dependent contrast in the pelvis. 4. Locules of gas are contiguous with the anterior abdominal wound consistent with a history of enteric contents visible in the wound site. 5. Stable bilateral pleural effusions. Electronically Signed: By: Nolon Nations M.D.  On: 02/12/2016 19:54    Anti-infectives: Anti-infectives    Start     Dose/Rate Route Frequency Ordered Stop   02/12/16 2200  vancomycin (VANCOCIN) 1,500 mg in sodium chloride 0.9 % 500 mL IVPB     1,500 mg 250 mL/hr over 120 Minutes Intravenous Every 24 hours 02/12/16 1618     02/09/16 0200  vancomycin (VANCOCIN) IVPB 1000 mg/200 mL premix  Status:  Discontinued     1,000 mg 200 mL/hr over 60 Minutes Intravenous Every 12 hours 02/08/16 1300 02/12/16 1359   02/08/16 1400  vancomycin (VANCOCIN) IVPB 1000 mg/200 mL premix  Status:  Discontinued     1,000 mg 200 mL/hr over 60 Minutes Intravenous Every 12 hours 02/08/16 1259 02/08/16 1300   02/08/16 1300  vancomycin (VANCOCIN) IVPB 1000 mg/200 mL premix     1,000 mg 200 mL/hr over 60 Minutes Intravenous  Once 02/08/16 1217 02/08/16 1336   02/08/16 1200  piperacillin-tazobactam (ZOSYN) IVPB 3.375 g     3.375 g 12.5 mL/hr over 240 Minutes Intravenous Every 8 hours 02/08/16 1136     02/06/16 1415  piperacillin-tazobactam (ZOSYN) IVPB 3.375 g     3.375 g 100 mL/hr over 30 Minutes Intravenous  Once 02/06/16 1413 02/06/16 1409   01/26/16 2300  piperacillin-tazobactam (ZOSYN) IVPB 3.375 g  Status:  Discontinued     3.375 g 12.5 mL/hr over 240 Minutes Intravenous Every 8 hours 01/26/16 2256 02/04/16 0905   01/26/16 2230  vancomycin (VANCOCIN) IVPB 1000 mg/200 mL premix  Status:  Discontinued     1,000 mg 200 mL/hr over 60 Minutes Intravenous Every 12 hours 01/26/16 2227 01/26/16 2227   01/20/16 0400  vancomycin (VANCOCIN) IVPB 1000 mg/200 mL premix  Status:  Discontinued     1,000 mg 200 mL/hr over 60 Minutes Intravenous Every 12 hours 01/20/16 0323 01/21/16 0900   01/20/16 0330  ceFEPIme (MAXIPIME) 1 g in dextrose 5 % 50 mL IVPB     1 g 100 mL/hr over 30 Minutes Intravenous  Once 01/20/16 0317 01/20/16 0735      Assessment/Plan: Problem List: Patient Active Problem List   Diagnosis Date Noted  . Enterocutaneous fistula 02/13/2016  .  Anemia of chronic disease 01/31/2016  . Stage 2 skin ulcer of sacral region 01/31/2016  . Hypomagnesemia 01/21/2016  . Hypokalemia 01/21/2016  . SBO (small bowel obstruction) 01/20/2016  . Pressure injury of skin 01/20/2016  . Tracheostomy care ([place])   . Pleural effusion   . Gastroesophageal reflux disease 01/19/2016  . Insomnia 01/19/2016  . Aspiration pneumonia ([place]) 01/17/2016  . On total parenteral nutrition (TPN) 01/17/2016  . Metabolic acidosis 33/54/5625  . Skin ulcer of abdominal wall, limited to breakdown of skin ([place]) 01/17/2016  . Dysphagia 01/17/2016  . Generalized weakness 01/17/2016  . 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 Vitamin B12 deficiency 01/02/2016  . BPH (benign prostatic hyperplasia)   . Posterior vitreous detachment   . Salzmann's nodular dystrophy of left eye   . Hyperopia of both eyes with astigmatism and presbyopia   . Central pterygium of right eye   . Pressure ulcer of buttock, right, unstageable ([place])  12/30/2015  . Hypernatremia 12/28/2015  . Tracheostomy in place [facility]) 12/25/2015  . Tobacco abuse 12/09/2015  . Ischemic right index finger at tip 12/09/2015  . Gastritis 12/09/2015  . Gastric tube present ([place]) 12/09/2015  . Acute respiratory failure with hypoxemia ([place])   . Protein-calorie malnutrition, severe 12/04/2015  . Encephalopathy acute 12/03/2015  . Pyloric stricture 11/24/2015  . Anxiety state 09/28/2015  . Hyperlipidemia   . 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 Coronary atherosclerosis of native coronary artery 04/24/2013  . Essential hypertension, benign 04/24/2013  . Other and unspecified hyperlipidemia 04/24/2013  . Central pterygium 10/30/2012  . 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 Cataract, nuclear sclerotic senile 10/30/2012    CT reviewed and large collections in the lower abdomen will need IR drainage.  IR consulted.  TNA for now.   No enteral feedings 7 Days Post-Op    LOS: 24 days   Matt B. Hassell Done, MD, [facility] [phone] beeper [phone]  02/13/2016 8:54 AM

## 2016-02-13 NOTE — Progress Notes (Signed)
Patient ID: [name], male   DOB: 02/15/1941, 75 y.o.   MRN: [id number] Pt's CT drain procedure has been postponed until tomorrow morning and [facility] Pt's family/nurse made aware. Following procedure tomorrow pt will be transported back to [facility].

## 2016-02-13 NOTE — Progress Notes (Signed)
PHARMACY - ADULT TOTAL PARENTERAL NUTRITION CONSULT NOTE   Pharmacy Consult for TPN Indication: PTA TPN continuation; bowel perforation  Insulin Requirements:  SSI:  2 units Novolog sensitive SSI in past 24 hours TPN: 10 units regular insulin per day in TPN   Current Nutrition:  - Clinimix as below  - NPO  IVF: off  Central access: PICC TPN start date: PTA, restarted on admission on 12/6    Recent Labs  02/11/16 0323 02/12/16 0346 02/13/16 0330  NA 136 133* 134*  K 3.9 3.9 3.8  CL 107 103 103  CO2 24 24 25   GLUCOSE 143* 111* 112*  BUN 27* 26* 25*  CREATININE 0.76 0.77 0.68  CALCIUM 8.2* 8.2* 8.0*  PHOS 3.8  --  3.9  MG 1.7 1.6* 1.8  ALBUMIN 1.2*  --  1.2*  ALKPHOS 280*  --  257*  AST 28  --  30  ALT 23  --  32  BILITOT 0.7  --  0.3  Corr Ca: 10.2 on 12/30  ASSESSMENT                                                                                                          HPI: 28 yoM with hx PUD, CAD, GERD, BPH,  s/p multiple procedures  and extended hospitalization for recurrent partial GOO 2/2 pyloric stricture on TPN for several days, discharged 11/15 with J/G tube in place on TFs, unable to tolerate and had to transition back to TPN via [facility] on 11/22, readmitted 12/6 with partial SBO. Pharmacy consulted to resume TPN. Note: PTA TNA from [place] was as follows: Totals per day: 405 grams (1376 kcal)  of dextrose, 75 grams (300 kcal) of protein and 50 grams ( 480 kcal) of lipid for a total of 2156 kcal/day   Significant events:  12/6: TPN continued on admission.  12/7: Pt pulled out NGT, not replaced, remains NPO 12/11: Awaiting MBS, if no aspiration, will start to advance diet.  Having bowel function.  Continue TPN for now.   12/12: Severe aspiration risk per MBS yesterday - recommend continuing NPO. Osmolite 1.2 CAL via TF BID ordered 12/11 at 6pm. Per discussion with CCS PA, continue TPN at current goal rate today, wean tomorrow if tolerates TF. 12/13:  Did not tolerate bolus feeds through G-tube; overnight emesis and aspiration event, started on Zosyn. CCS trying trickle feeds today. 12/14 transition to cyclic TPN - appears recovery may be prolonged. Not tolerating trickle feeds; G-tube to drainage.  12/15: TPN was not run at cyclic rate overnight d/t conflicting information in TPN order. To provide patient full needed nutrition, will continue to run TPN at 83 ml/hr over 24 hr and begin 18-hr cycle with tonight's TPN 12/16: start 12h TPN cycle 12/18: resume trickle tube feed at 5 ml/hr 12/19: Nausea/vomiting, tube feed held. TPN formula changed to E 5/20 from E 5/15 12/21: successful transition of G tube to [place] tube. Plan scan for patency before resuming tube feeds.  12/22: Upper GI scan in IR today to ensure  J-tube working.  12/23: exp lap, lysis of adhesions, segmental small bowel resection, gastrojejunostomy 12/25: TPN converted from cyclic back to continuous due to hypoglycemic event following end of TPN cycle and patient likely to remain hospitalized for at least several more days. 12/28: to start trickle feeds thru J-tube; G-tube remains on drainage 12/29: TF increased from 10 ml/hr to 20 ml/hr  12/29 PM: TF was found draining from midline incision, CT abdomen c/w bowel perforation; interval development of abscesses .  TF was stopped and is to remain off until MD orders otherwise.  Today, 02/13/2016:   Glucose within goal range (100-150). No hx DM.[name]   Electrolytes: Na slightly low, others WNL  Renal - SCr WNL, BUN elevated but decreasing.    LFTs:  Alk phos elevated (~2x ULN) but improving.  All others < ULN.  TGs (12/24): WNL  Prealbumin:  15.3 ( 11/13), 10.8 (12/7), 12.8 (12/11), 13.1 (12/18), 8.9 (12/25) - falling  NUTRITIONAL GOALS                                                                                             RD recs (12/26): Kcal 2000-2200, Protein 100-115g, Fluid 2L/day [facility] TPN: 2156 Kcal/day, Protein  75g/day  Clinimix E 5/20 at a goal rate of 83 ml/hr + 20% fat emulsion at 10 ml/hr to provide: 100 g/day protein, 2240 Kcal/day. (+++There is currently a Psychologist, prison and probation services of Clinimix solution.  Pharmacy will use Clinimix product based on what we have available in stock+++)  PLAN                                                                                   At 1800 today:  Continue Clinimix E 5/20 at 83 ml/hr.  Continue 20% fat emulsion at 10 ml/hr.  Continue 10 units regular insulin per day in TPN.   TPN to contain standard multivitamins and trace elements.  Enteral feeds to remain off due to bowel leak  Continue sensitive SSI and CBG checks q6h.   TPN lab panels on Mondays & Thursdays.  Follow clinical course daily.  Clayburn Pert, PharmD, BCPS Pager: [phone] 02/13/2016  9:10 AM

## 2016-02-13 NOTE — Progress Notes (Signed)
Referring Physician(s): Martin,Matt  Supervising Physician: Simonne Come  Patient Status:  [facility] - In-pt  Chief Complaint: Abdominal/pelvic fluid collections   Subjective: 75 year-old male with a medical history of GERD, PUD, and CAD who presented to [facility] ED from [facility] via EMS with a cc of nausea, vomiting, and diarrhea. Patient is s/p Robotic anterior and posterior vagotomy, BI anastomosis, DOR fundoplication, omentopexy 09/2015 for partial GOO secondary to pyloric stricture, followed by placement of feeding jejunostomy and gastrostomy tube and EGD balloon dilation of duodenal stricture 12/01/15 for recurrent GOO secondary to edema at anastomosis and failure to thrive with malnutrition. On 12/03/15 the patient was taken to the OR for a diagnostic laparoscopy, omentopexy of jejunal disruption, and washout for jejunal disruption (pt pulled out jejunostomy tube) and gastric leak. On 12/11/15 he underwent CT-guided percutaneous drainage of separate left upper quadrant peritoneal collections along the greater curvature of the stomach and lateral to the spleen, one of which was removed on 10/31 and other on 11/1.  His J tube was subsequently replaced on 12/15/15 after being pulled out and on 12/21 he underwent conversion of G tube to GJ. He underwent exploratory lap with lysis of adhesions, segmental small bowel resection and gastrojejunostomy on 02/06/16 secondary to small bowel obstruction.He now has enteric contents coming out of midline abd incision as well as leukocytosis. CT A/P yesterday revealed : 1. Interval development of multiple rim enhancing collections consistent with abscesses. 2. Evidence for perforation adjacent to the gastrojejunostomy suture line. 3. Small amounts of fluid and dependent contrast in the pelvis. 4. Locules of gas are contiguous with the anterior abdominal wound consistent with a history of enteric contents visible in the wound site. 5.  Stable bilateral pleural effusions. Request now received from [facility] for drainage of largest LLQ collection.   Allergies: Flomax [tamsulosin hcl]; Lisinopril; Lorazepam; and Uroxatral [alfuzosin hcl er]  Medications: Prior to Admission medications   Medication Sig Start Date End Date Taking? Authorizing Provider  aluminum-magnesium hydroxide-simethicone (MAALOX) 200-200-20 MG/5ML SUSP Take 30 mLs by mouth every 6 (six) hours as needed.   Yes Historical Provider, MD  Amino Ac Elect-Calc in D10W (CLINIMIX E/DEXTROSE, 2.75/10,) 2.75 % SOLN Inject 2,160 mLs into the vein as needed.   Yes Historical Provider, MD  aspirin 81 MG chewable tablet Place 81 mg into feeding tube every evening.   Yes Historical Provider, MD  atorvastatin (LIPITOR) 40 MG tablet Place 40 mg into feeding tube at bedtime.    Yes Historical Provider, MD  bisacodyl (DULCOLAX) 10 MG suppository Place 10 mg rectally daily as needed for moderate constipation or severe constipation.   Yes Historical Provider, MD  bismuth subsalicylate (PEPTO BISMOL) 262 MG/15ML suspension Place 30 mLs into feeding tube every 8 (eight) hours as needed for indigestion or diarrhea or loose stools.   Yes Historical Provider, MD  calcium-vitamin D (OSCAL WITH D) 500-200 MG-UNIT tablet Place 1 tablet into feeding tube daily with breakfast.    Yes Historical Provider, MD  Cyanocobalamin (VITAMIN B-12) 1000 MCG SUBL Place 1 tablet under the tongue daily with breakfast.   Yes Historical Provider, MD  Ferrous Sulfate 220 (44 Fe) MG/5ML LIQD Place 7.5 mLs into feeding tube at bedtime.    Yes Historical Provider, MD  FLUoxetine (PROZAC) 20 MG/5ML solution Place 20 mg into feeding tube daily.   Yes Historical Provider, MD  haloperidol (HALDOL) 0.5 MG tablet Take 0.25-0.5 mg by mouth 3 (three) times daily. At 6  am and 4 PM and 9 PM   Yes Historical Provider, MD  haloperidol (HALDOL) 0.5 MG tablet Place 0.5 mg into feeding tube as needed for agitation. STOP DATE  01/24/2016 01/10/16 01/24/16 Yes Historical Provider, MD  loperamide (IMODIUM) 1 MG/5ML solution 10 mg. Take 10 mls via tube every 8 hours as needed for more than 2 bms every 8 hours.   Yes Historical Provider, MD  magic mouthwash SOLN Take 15 mLs by mouth 4 (four) times daily as needed for mouth pain (sore throat). 12/30/15  Yes Karie Soda, MD  metoCLOPramide (REGLAN) 5 MG/5ML solution Place 10 mg into feeding tube every 6 (six) hours as needed for nausea or vomiting.   Yes Historical Provider, MD  metoprolol tartrate (LOPRESSOR) 25 MG tablet Place 25 mg into feeding tube daily.   Yes Historical Provider, MD  nicotine (NICODERM CQ - DOSED IN MG/24 HR) 7 mg/24hr patch Place 7 mg onto the skin daily. Rotate site   Yes Historical Provider, MD  nitroGLYCERIN (NITROSTAT) 0.4 MG SL tablet Place 0.4 mg under the tongue every 5 (five) minutes as needed for chest pain.   Yes Historical Provider, MD  Nutritional Supplements (FEEDING SUPPLEMENT, OSMOLITE 1.2 CAL,) LIQD Place 237 mLs into feeding tube. Give 1 can via G-tube every day at  10 am and  6 pm.   Yes Historical Provider, MD  omeprazole (PRILOSEC) 40 MG capsule Take 40 mg by mouth 2 (two) times daily.   Yes Historical Provider, MD  Polyvinyl Alcohol-Povidone (REFRESH OP) Place 2 drops into both eyes as needed (For dry eyes.).    Yes Historical Provider, MD  potassium chloride (KCL) 2 mEq/mL SOLN oral liquid Take 30 mEq by mouth daily.   Yes Historical Provider, MD  RESTASIS 0.05 % ophthalmic emulsion Place 1 drop into both eyes 2 (two) times daily.  04/16/13  Yes Historical Provider, MD  traZODone (DESYREL) 50 MG tablet Place 100 mg into feeding tube at bedtime.    Yes Historical Provider, MD  Water For Irrigation, Sterile (STERILE WATER FOR IRRIGATION) Irrigate with 250 mLs as directed every 6 (six) hours.    Historical Provider, MD     Vital Signs: BP (!) 125/53 (BP Location: Left Arm)   Pulse 89   Temp 98.2 F (36.8 C) (Oral)   Resp 20   Ht  6\' 2"  (1.88 m)   Wt 183 lb 10.3 oz (83.3 kg)   SpO2 98%   BMI 23.58 kg/m   Physical Exam awake but confused; trach in place; Chest with slightly diminished breath sounds at bases. Heart with irregularly irregular rhythm; abdomen with hypoactive bowel sounds, open midline incision withsome beige-colored drainage. GJ tube intact. Lower extremities with pitting edema bilaterally.  Imaging: Dg Abd 1 View  Result Date: 02/10/2016 CLINICAL DATA:  Jejunostomy tube placement EXAM: ABDOMEN - 1 VIEW COMPARISON:  02/06/2016 FINDINGS: Contrast is seen within decompressed small bowel loops. No evidence of contrast extravasation or bowel obstruction. Prior cholecystectomy. IMPRESSION: Contrast seen within decompressed small bowel loops. No contrast extravasation. Electronically Signed   By: Charlett Nose M.D.   On: 02/10/2016 18:37   Ct Abdomen Pelvis W Contrast  Addendum Date: 02/12/2016   ADDENDUM REPORT: 02/12/2016 20:07 ADDENDUM: The salient findings were discussed with Dr. Daphine Deutscher on 02/12/2016 at 7:55 p.m. Electronically Signed   By: Norva Pavlov M.D.   On: 02/12/2016 20:07   Result Date: 02/12/2016 CLINICAL DATA:  Drainage from abdominal wound. Enteric contents coming out of midline incision.  EXAM: CT ABDOMEN AND PELVIS WITH CONTRAST TECHNIQUE: Multidetector CT imaging of the abdomen and pelvis was performed using the standard protocol following bolus administration of intravenous contrast. CONTRAST:  30mL ISOVUE-300 IOPAMIDOL (ISOVUE-300) INJECTION 61%, 100mL ISOVUE-300 IOPAMIDOL (ISOVUE-300) INJECTION 61% COMPARISON:  02/10/2016, 01/20/2016 FINDINGS: Lower chest: There are bilateral pleural effusions, similar to the prior study. Bibasilar atelectasis is present. Heart size is normal. No imaged pericardial effusion or significant coronary artery calcifications. Hepatobiliary: Status post cholecystectomy. Mild biliary duct dilatation. Pancreas: Unremarkable. No pancreatic ductal dilatation or  surrounding inflammatory changes. Spleen: Normal in size without focal abnormality. Adrenals/Urinary Tract: Adrenal glands are normal. There is mild prominence of the collecting systems of the kidneys bilaterally, likely related to the distended urinary bladder. No urinary tract calculi are identified. Stomach/Bowel: There is a percutaneous feeding tube in place, injuring the stomach through the left anterior abdominal wall. Status post antrectomy of the stomach and gastric duodenostomy. The feeding tube is intraluminal in a right upper quadrant jejunal loop. Sutures are identified within the region region of the gastroduodenal ostomy. Small locules of gas are identified within this region and are contiguous with the anterior abdominal wall wound. Small amounts of contrast are identified adjacent to the suture line in this region, and the surgical anastomosis is the most likely site of perforation. The colon is collapsed. There is mild thickening of colonic wall. Contrast is identified in the ascending colon following prior injection of contrast. Vascular/Lymphatic: Dense atherosclerotic calcification of the abdominal aorta. Reproductive: Prostate gland and seminal vesicles are normal in appearance. Distended urinary bladder. Bladder contains gas in may indicate recent catheterization. Other: Within the left lower quadrant, there is a rim enhancing collection which measures 4.4 x 10.2 x 16.7 cm. This collection abuts the anterior abdominal wall at the level of the iliac crest. Smaller similar collections are also identified throughout the pelvis. On the right, collection adjacent to the cecum is 4.9 x 4.3 cm. Small collection in the left mid abdomen is 2.9 x 5.0 cm. Adjacent to the sigmoid colon, there are small amounts of fluid containing contrast, consistent with bowel perforation. Small amounts of free air are also present along the anterior abdominal wall. Musculoskeletal: No acute or significant osseous  findings. IMPRESSION: 1. Interval development of multiple rim enhancing collections consistent with abscesses. 2. Evidence for perforation adjacent to the gastrojejunostomy suture line. 3. Small amounts of fluid and dependent contrast in the pelvis. 4. Locules of gas are contiguous with the anterior abdominal wound consistent with a history of enteric contents visible in the wound site. 5. Stable bilateral pleural effusions. Electronically Signed: By: Norva PavlovElizabeth  Brown M.D. On: 02/12/2016 19:54    Labs:  CBC:  Recent Labs  02/09/16 0429 02/11/16 0323 02/12/16 0346 02/13/16 0330  WBC 21.6* 14.5* 14.6* 15.5*  HGB 8.0* 7.8* 8.0* 7.7*  HCT 23.9* 23.8* 24.6* 23.6*  PLT 447* 509* 561* 557*    COAGS:  Recent Labs  12/04/15 2020 12/11/15 0405 12/13/15 1402  INR 1.62 1.25 1.26  APTT 39*  --  38*    BMP:  Recent Labs  02/10/16 0405 02/11/16 0323 02/12/16 0346 02/13/16 0330  NA 134* 136 133* 134*  K 3.6 3.9 3.9 3.8  CL 107 107 103 103  CO2 23 24 24 25   GLUCOSE 134* 143* 111* 112*  BUN 32* 27* 26* 25*  CALCIUM 7.9* 8.2* 8.2* 8.0*  CREATININE 0.70 0.76 0.77 0.68  GFRNONAA >60 >60 >60 >60  GFRAA >60 >60 >60 >60  LIVER FUNCTION TESTS:  Recent Labs  02/07/16 1001 02/08/16 0425 02/11/16 0323 02/13/16 0330  BILITOT 0.3 0.4 0.7 0.3  AST 26 21 28 30   ALT 30 23 23  32  ALKPHOS 118 114 280* 257*  PROT 4.7* 4.8* 5.1* 5.4*  ALBUMIN 1.3* 1.3* 1.2* 1.2*    Assessment and Plan: 75 year-old male with a medical history of GERD, PUD, and CAD who presented to [facility] ED from [facility] via EMS with a cc of nausea, vomiting, and diarrhea. Patient is s/p Robotic anterior and posterior vagotomy, BI anastomosis, DOR fundoplication, omentopexy 09/2015 for partial GOO secondary to pyloric stricture, followed by placement of feeding jejunostomy and gastrostomy tube and EGD balloon dilation of duodenal stricture 12/01/15 for recurrent GOO secondary to edema at  anastomosis and failure to thrive with malnutrition. On 12/03/15 the patient was taken to the OR for a diagnostic laparoscopy, omentopexy of jejunal disruption, and washout for jejunal disruption (pt pulled out jejunostomy tube) and gastric leak. On 12/11/15 he underwent CT-guided percutaneous drainage of separate left upper quadrant peritoneal collections along the greater curvature of the stomach and lateral to the spleen, one of which was removed on 10/31 and other on 11/1.  His J tube was subsequently replaced on 12/15/15 after being pulled out and on 12/21 he underwent conversion of G tube to GJ. He underwent exploratory lap with lysis of adhesions, segmental small bowel resection and gastrojejunostomy on 02/06/16 secondary to small bowel obstruction.He now has enteric contents coming out of midline abd incision as well as leukocytosis. CT A/P yesterday revealed : 1. Interval development of multiple rim enhancing collections consistent with abscesses. 2. Evidence for perforation adjacent to the gastrojejunostomy suture line. 3. Small amounts of fluid and dependent contrast in the pelvis. 4. Locules of gas are contiguous with the anterior abdominal wound consistent with a history of enteric contents visible in the wound site. 5. Stable bilateral pleural effusions. Request now received from [facility] for drainage of largest LLQ collection. Imaging studies have been reviewed by Dr. Grace IsaacWatts and left lower abdominal collection is amenable to drainage. Patient received subcutaneous heparin at 0530 today, therefore procedure will be done early this afternoon. Details/risks of procedure, including but not limited to, internal bleeding, infection, injury to adjacent structures, need for more than one drain, possible need for surgery discussed with patient's wife and daughter with their understanding and consent.   Electronically Signed: D. Jeananne RamaKevin Allred 02/13/2016, 10:16 AM   I spent a total of 20 minutes at the  the patient's bedside AND on the patient's hospital floor or unit, greater than 50% of which was counseling/coordinating care for CT-guided drainage of pelvic abscess(es)    Patient ID: Ardyth GalRalph T Wille, male   DOB: [birth year], 75 y.o.   MRN: [id number]

## 2016-02-13 NOTE — Progress Notes (Signed)
Notified by telemetry that pt had 10 beat run of V Tach. Pt relaxed and laying in bed.

## 2016-02-13 NOTE — Progress Notes (Signed)
PT Cancellation Note  Patient Details Name: Danny GalRalph T [name] MRN: [id number] DOB: [birth year]   Cancelled Treatment:  Pt going for procedure today.  Will check back next week.     Bayard Huggereresa K. Manson PasseyBrown, PT  Donnetta HailBrown, Tamika Shropshire Krall 02/13/2016, 11:31 AM

## 2016-02-14 ENCOUNTER — Ambulatory Visit (HOSPITAL_COMMUNITY): Admission: RE | Admit: 2016-02-14 | Payer: Medicare Other | Source: Ambulatory Visit | Admitting: Surgery

## 2016-02-14 ENCOUNTER — Inpatient Hospital Stay (HOSPITAL_COMMUNITY)
Admit: 2016-02-14 | Discharge: 2016-02-14 | Disposition: A | Discharge status: Home / Self Care | Payer: Medicare Other | Attending: Surgery | Admitting: Surgery

## 2016-02-14 LAB — GLUCOSE, CAPILLARY
GLUCOSE-CAPILLARY: 116 mg/dL — AB (ref 65–99)
GLUCOSE-CAPILLARY: 108 mg/dL — AB (ref 65–99)
Glucose-Capillary: 117 mg/dL — ABNORMAL HIGH (ref 65–99)
Glucose-Capillary: 126 mg/dL — ABNORMAL HIGH (ref 65–99)
Glucose-Capillary: 127 mg/dL — ABNORMAL HIGH (ref 65–99)

## 2016-02-14 MED ORDER — TRACE MINERALS CR-CU-MN-SE-ZN 10-1000-500-60 MCG/ML IV SOLN
INTRAVENOUS | Status: AC
  Administered 2016-02-14: 18:00:00 via INTRAVENOUS
  Filled 2016-02-14: qty 2000

## 2016-02-14 MED ORDER — FENTANYL CITRATE (PF) 100 MCG/2ML IJ SOLN
INTRAMUSCULAR | Status: AC | PRN
  Administered 2016-02-14 (×2): 25 ug via INTRAVENOUS

## 2016-02-14 MED ORDER — MIDAZOLAM HCL 2 MG/2ML IJ SOLN
INTRAMUSCULAR | Status: AC | PRN
  Administered 2016-02-14: 0.5 mg via INTRAVENOUS
  Administered 2016-02-14: 1 mg via INTRAVENOUS

## 2016-02-14 MED ORDER — FAT EMULSION 20 % IV EMUL
240.0000 mL | INTRAVENOUS | Status: AC
  Administered 2016-02-14: 240 mL via INTRAVENOUS
  Filled 2016-02-14: qty 240

## 2016-02-14 NOTE — Procedures (Signed)
Interventional Radiology Procedure Note  Procedure:  CT guided abdominal abscess drainage  Complications: None   Estimated Blood Loss: < 10 mL  Aspiration LLQ fluid collection yielded bloody, slightly turbid fluid. 12 Fr drain placed and attached to suction bulb.  Jodi MarbleGlenn T. Fredia SorrowYamagata, M.D Pager:  [phone]

## 2016-02-14 NOTE — Sedation Documentation (Signed)
Patient is resting comfortably. 

## 2016-02-14 NOTE — Progress Notes (Signed)
PHARMACY - ADULT TOTAL PARENTERAL NUTRITION CONSULT NOTE   Pharmacy Consult for TPN Indication: PTA TPN continuation; bowel perforation  Insulin Requirements:  SSI:  2 units Novolog sensitive SSI in past 24 hours TPN: 10 units regular insulin per day in TPN   Current Nutrition:  - NPO - Clinmix-E 5/20 at 83 mL/hr, Fat Emulsion 20% at 10 mL/hr  IVF: off  Central access: PICC TPN start date: PTA, restarted on admission on 12/6    Recent Labs  02/12/16 0346 02/13/16 0330  NA 133* 134*  K 3.9 3.8  CL 103 103  CO2 24 25  GLUCOSE 111* 112*  BUN 26* 25*  CREATININE 0.77 0.68  CALCIUM 8.2* 8.0*  PHOS  --  3.9  MG 1.6* 1.8  ALBUMIN  --  1.2*  ALKPHOS  --  257*  AST  --  30  ALT  --  32  BILITOT  --  0.3  Corr Ca: 10.2 on 12/30  ASSESSMENT                                                                                                          HPI: 70 yoM with hx PUD, CAD, GERD, BPH,  s/p multiple procedures  and extended hospitalization for recurrent partial GOO 2/2 pyloric stricture on TPN for several days, discharged 11/15 with J/G tube in place on TFs, unable to tolerate and had to transition back to TPN via [facility] on 11/22, readmitted 12/6 with partial SBO. Pharmacy consulted to resume TPN. Note: PTA TNA from [place] was as follows: Totals per day: 405 grams (1376 kcal)  of dextrose, 75 grams (300 kcal) of protein and 50 grams ( 480 kcal) of lipid for a total of 2156 kcal/day   Significant events:  12/6: TPN continued on admission.  12/7: Pt pulled out NGT, not replaced, remains NPO 12/11: Awaiting MBS, if no aspiration, will start to advance diet.  Having bowel function.  Continue TPN for now.   12/12: Severe aspiration risk per MBS yesterday - recommend continuing NPO. Osmolite 1.2 CAL via TF BID ordered 12/11 at 6pm. Per discussion with [facility] PA, continue TPN at current goal rate today, wean tomorrow if tolerates TF. 12/13: Did not tolerate bolus feeds through  G-tube; overnight emesis and aspiration event, started on Zosyn. [facility] trying trickle feeds today. 12/14 transition to cyclic TPN - appears recovery may be prolonged. Not tolerating trickle feeds; G-tube to drainage.  12/15: TPN was not run at cyclic rate overnight d/t conflicting information in TPN order. To provide patient full needed nutrition, will continue to run TPN at 83 ml/hr over 24 hr and begin 18-hr cycle with tonight's TPN 12/16: start 12h TPN cycle 12/18: resume trickle tube feed at 5 ml/hr 12/19: Nausea/vomiting, tube feed held. TPN formula changed to E 5/20 from E 5/15 12/21: successful transition of G tube to Bee tube. Plan scan for patency before resuming tube feeds.  12/22: Upper GI scan in IR today to ensure J-tube working.  12/23: exp lap, lysis of adhesions, segmental  small bowel resection, gastrojejunostomy 12/25: TPN converted from cyclic back to continuous due to hypoglycemic event following end of TPN cycle and patient likely to remain hospitalized for at least several more days.  Prealbumin low, falling - Fat Emulsion increased to 10 mL/hr.  Clinimix already at 2L/day. 12/28: to start trickle feeds thru J-tube; G-tube remains on drainage 12/29: TF increased from 10 ml/hr to 20 ml/hr  12/29 PM: TF was found draining from midline incision, CT abdomen c/w bowel perforation at G-J suture site; interval development of abscesses .  TF was stopped and is to remain off until MD orders otherwise. 12/31: Plan percutaneous drainage of largest LLQ fluid collection today in IR.  Because of construction at [facility], procedure will be performed at [facility] and patient will be returned to [facility] post-procedure.   Today, 02/14/2016:   Glucose within goal range (100-150). No hx DM.[name]   Electrolytes (12/30): Na slightly low, others WNL  Renal (12/30) - SCr WNL, BUN elevated but decreasing.    LFTs (12/30):  Alk phos elevated (~2x ULN) but improving.  All others < ULN.  TGs (12/24): WNL  Prealbumin:   15.3 ( 11/13), 10.8 (12/7), 12.8 (12/11), 13.1 (12/18), 8.9 (12/25) - falling  NUTRITIONAL GOALS                                                                                             RD recs (12/26): Kcal 2000-2200, Protein 100-115g, Fluid 2L/day [facility] TPN: 2156 Kcal/day, Protein 75g/day  Clinimix E 5/20 at a goal rate of 83 ml/hr + 20% fat emulsion at 10 ml/hr to provide: 100 g/day protein, 2240 Kcal/day. (+++There is currently a Psychologist, prison and probation services of Clinimix solution.  Pharmacy will use Clinimix product based on what we have available in stock+++)  PLAN                                                                                   At 1800 today:  Continue Clinimix E 5/20 at 83 ml/hr.  Continue 20% fat emulsion at 10 ml/hr.  Continue 10 units regular insulin per day in TPN.   TPN to contain standard multivitamins and trace elements.  Enteral feeds to remain off due to bowel leak  Continue sensitive SSI and CBG checks q6h.   TPN lab panels on Mondays & Thursdays - next due tomorrow.  Follow clinical course daily.  Clayburn Pert, PharmD, BCPS Pager: [phone] 02/14/2016  5:30 AM

## 2016-02-14 NOTE — Sedation Documentation (Signed)
Patient is resting comfortably. No complaints at this time. Pt vitals are stable.

## 2016-02-14 NOTE — Sedation Documentation (Signed)
Received report from [facility] rn, angelia

## 2016-02-14 NOTE — Progress Notes (Signed)
Pharmacy Antibiotic Note  [name] is a 75 y.o. male who is s/p multiple procedures and an extended hospitalization for recurrent partial GOO, readmitted on 01/19/2016 with partial SBO and possible aspiration PNA.  Pharmacy was consulted to resume Vancomycin and Zosyn dosing on 12/25 (POD #2 from exploratory laparotomy, lysis of adhesions, segmental small bowel resection, gastrojejunostomy) as empiric coverage for new leukocytosis.  On 12/29 CT demonstrated bowel leak at G-J suture with development of intra-abdominal fluid collections.    D7 resumption of IV abx Percutaneous drainage of the largest collection is planned by IR today.  Plan:  Continue .Zosyn 3.375gm IV q8h (4hr extended infusions).  Continue vancomycin 1500mg  IV q24h - (reduced to this dosage 12/29 due to high trough).  Recheck trough at steady-state unless interim de-escalation is possible.  Monitor renal function, cultures, clinical course, ability to de-escalate antibiotic therapy.   Height: 6\' 2"  (188 cm) Weight: 182 lb 8.7 oz (82.8 kg) IBW/kg (Calculated) : 82.2  Temp (24hrs), Avg:98.3 F (36.8 C), Min:97.9 F (36.6 C), Max:98.6 F (37 C)   Recent Labs Lab 02/08/16 0425 02/09/16 0429 02/10/16 0405 02/11/16 0323 02/12/16 0346 02/12/16 1256 02/13/16 0330  WBC 32.0* 21.6*  --  14.5* 14.6*  --  15.5*  CREATININE 1.00 0.87 0.70 0.76 0.77  --  0.68  VANCOTROUGH  --   --   --   --   --  25*  --     Estimated Creatinine Clearance: 91.3 mL/min (by C-G formula based on SCr of 0.68 mg/dL).    Allergies  Allergen Reactions  . Flomax [Tamsulosin Hcl] Other (See Comments)    Leg weakness  . Lisinopril Cough  . Lorazepam     "i don't like it"  Prefers Xanax or valium  . Uroxatral [Alfuzosin Hcl Er] Other (See Comments)    Leg weakness    Antimicrobials this admission:  12/6 Cefepime >> 12/6 12/6 Vancomycin >> 12/7, resume 12/25 >> 12/12 Zosyn >> 12/21, resume 12/25 >>  Levels/Dosage  adjustments: 12/29: VT 25 on vanc 1g q12h ; dosage reduced to 1500mg  q24h   Microbiology results: 12/7 influenza A/B negative  12/23 MRSA PCR (-)  Thank you for allowing pharmacy to be a part of this patient's care.  Elie Goodyandy Novalyn Lajara, PharmD, BCPS Pager: [phone] 02/14/2016  9:09 AM

## 2016-02-14 NOTE — Sedation Documentation (Signed)
Vital signs stable. 

## 2016-02-14 NOTE — Sedation Documentation (Signed)
Vital signs stable. No complaints from pt at this time, tolerating procedure well.

## 2016-02-14 NOTE — Progress Notes (Signed)
Patient ID: [name], male   DOB: 02/10/1941, 75 y.o.   MRN: [id number]  [facility] Progress Note:   8 Days Post-Op  Subjective: Mental status is clear.  Discussed drainage with wife and daughter Objective: Vital signs in last 24 hours: Temp:  [97.9 F (36.6 C)-98.6 F (37 C)] 98.3 F (36.8 C) (12/31 2778) Pulse Rate:  [77-109] 81 (12/31 0613) Resp:  [16-20] 16 (12/31 0613) BP: (126-134)/(45-75) 127/52 (12/31 0613) SpO2:  [96 %-99 %] 98 % (12/31 0613) FiO2 (%):  [28 %] 28 % (12/31 0400) Weight:  [82.8 kg (182 lb 8.7 oz)] 82.8 kg (182 lb 8.7 oz) (12/31 2423)  Intake/Output from previous day: 12/30 0701 - 12/31 0700 In: 2932.2 [I.V.:2232.2; IV Piggyback:700] Out: 1400 [Urine:1025; Drains:375] Intake/Output this shift: No intake/output data recorded.  Physical Exam: Work of breathing is not labored.  Trach in place  Lab Results:  Results for orders placed or performed during the hospital encounter of 01/19/16 (from the past 48 hour(s))  Glucose, capillary     Status: Abnormal   Collection Time: 02/12/16 12:14 PM  Result Value Ref Range   Glucose-Capillary 134 (H) 65 - 99 mg/dL  Vancomycin, trough     Status: Abnormal   Collection Time: 02/12/16 12:56 PM  Result Value Ref Range   Vancomycin Tr 25 (HH) 15 - 20 ug/mL    Comment: CRITICAL RESULT CALLED TO, READ BACK BY AND VERIFIED WITH: PHILLIPS,K. RN AT 5361 02/12/16 MULLINS,T   Glucose, capillary     Status: Abnormal   Collection Time: 02/12/16  6:18 PM  Result Value Ref Range   Glucose-Capillary 112 (H) 65 - 99 mg/dL  Glucose, capillary     Status: Abnormal   Collection Time: 02/13/16 12:13 AM  Result Value Ref Range   Glucose-Capillary 114 (H) 65 - 99 mg/dL  Magnesium     Status: None   Collection Time: 02/13/16  3:30 AM  Result Value Ref Range   Magnesium 1.8 1.7 - 2.4 mg/dL  Phosphorus     Status: None   Collection Time: 02/13/16  3:30 AM  Result Value Ref Range   Phosphorus 3.9 2.5 - 4.6  mg/dL  CBC with Differential/Platelet     Status: Abnormal   Collection Time: 02/13/16  3:30 AM  Result Value Ref Range   WBC 15.5 (H) 4.0 - 10.5 K/uL   RBC 2.67 (L) 4.22 - 5.81 MIL/uL   Hemoglobin 7.7 (L) 13.0 - 17.0 g/dL   HCT 23.6 (L) 39.0 - 52.0 %   MCV 88.4 78.0 - 100.0 fL   MCH 28.8 26.0 - 34.0 pg   MCHC 32.6 30.0 - 36.0 g/dL   RDW 16.4 (H) 11.5 - 15.5 %   Platelets 557 (H) 150 - 400 K/uL   Neutrophils Relative % 67 %   Lymphocytes Relative 19 %   Monocytes Relative 13 %   Eosinophils Relative 1 %   Basophils Relative 0 %   Neutro Abs 10.4 (H) 1.7 - 7.7 K/uL   Lymphs Abs 2.9 0.7 - 4.0 K/uL   Monocytes Absolute 2.0 (H) 0.1 - 1.0 K/uL   Eosinophils Absolute 0.2 0.0 - 0.7 K/uL   Basophils Absolute 0.0 0.0 - 0.1 K/uL   Smear Review MORPHOLOGY UNREMARKABLE   Comprehensive metabolic panel     Status: Abnormal   Collection Time: 02/13/16  3:30 AM  Result Value Ref Range   Sodium 134 (L) 135 - 145 mmol/L   Potassium 3.8 3.5 -  5.1 mmol/L   Chloride 103 101 - 111 mmol/L   CO2 25 22 - 32 mmol/L   Glucose, Bld 112 (H) 65 - 99 mg/dL   BUN 25 (H) 6 - 20 mg/dL   Creatinine, Ser 0.68 0.61 - 1.24 mg/dL   Calcium 8.0 (L) 8.9 - 10.3 mg/dL   Total Protein 5.4 (L) 6.5 - 8.1 g/dL   Albumin 1.2 (L) 3.5 - 5.0 g/dL   AST 30 15 - 41 U/L   ALT 32 17 - 63 U/L   Alkaline Phosphatase 257 (H) 38 - 126 U/L   Total Bilirubin 0.3 0.3 - 1.2 mg/dL   GFR calc non Af Amer >60 >60 mL/min   GFR calc Af Amer >60 >60 mL/min    Comment: (NOTE) The eGFR has been calculated using the CKD EPI equation. This calculation has not been validated in all clinical situations. eGFR's persistently <60 mL/min signify possible Chronic Kidney Disease.    Anion gap 6 5 - 15  Glucose, capillary     Status: Abnormal   Collection Time: 02/13/16  5:12 AM  Result Value Ref Range   Glucose-Capillary 124 (H) 65 - 99 mg/dL  Protime-INR     Status: None   Collection Time: 02/13/16 11:40 AM  Result Value Ref Range    Prothrombin Time 15.0 11.4 - 15.2 seconds   INR 1.18   Glucose, capillary     Status: Abnormal   Collection Time: 02/13/16 12:20 PM  Result Value Ref Range   Glucose-Capillary 122 (H) 65 - 99 mg/dL  Glucose, capillary     Status: Abnormal   Collection Time: 02/13/16  6:08 PM  Result Value Ref Range   Glucose-Capillary 109 (H) 65 - 99 mg/dL  Glucose, capillary     Status: Abnormal   Collection Time: 02/14/16 12:04 AM  Result Value Ref Range   Glucose-Capillary 126 (H) 65 - 99 mg/dL  Glucose, capillary     Status: Abnormal   Collection Time: 02/14/16  6:16 AM  Result Value Ref Range   Glucose-Capillary 127 (H) 65 - 99 mg/dL    Radiology/Results: Ct Abdomen Pelvis W Contrast  Addendum Date: 02/12/2016   ADDENDUM REPORT: 02/12/2016 20:07 ADDENDUM: The salient findings were discussed with Dr. Hassell Done on 02/12/2016 at 7:55 p.m. Electronically Signed   By: Nolon Nations M.D.   On: 02/12/2016 20:07   Result Date: 02/12/2016 CLINICAL DATA:  Drainage from abdominal wound. Enteric contents coming out of midline incision. EXAM: CT ABDOMEN AND PELVIS WITH CONTRAST TECHNIQUE: Multidetector CT imaging of the abdomen and pelvis was performed using the standard protocol following bolus administration of intravenous contrast. CONTRAST:  40m ISOVUE-300 IOPAMIDOL (ISOVUE-300) INJECTION 61%, 1017mISOVUE-300 IOPAMIDOL (ISOVUE-300) INJECTION 61% COMPARISON:  02/10/2016, 01/20/2016 FINDINGS: Lower chest: There are bilateral pleural effusions, similar to the prior study. Bibasilar atelectasis is present. Heart size is normal. No imaged pericardial effusion or significant coronary artery calcifications. Hepatobiliary: Status post cholecystectomy. Mild biliary duct dilatation. Pancreas: Unremarkable. No pancreatic ductal dilatation or surrounding inflammatory changes. Spleen: Normal in size without focal abnormality. Adrenals/Urinary Tract: Adrenal glands are normal. There is mild prominence of the collecting  systems of the kidneys bilaterally, likely related to the distended urinary bladder. No urinary tract calculi are identified. Stomach/Bowel: There is a percutaneous feeding tube in place, injuring the stomach through the left anterior abdominal wall. Status post antrectomy of the stomach and gastric duodenostomy. The feeding tube is intraluminal in a right upper quadrant jejunal loop. Sutures are identified  within the region region of the gastroduodenal ostomy. Small locules of gas are identified within this region and are contiguous with the anterior abdominal wall wound. Small amounts of contrast are identified adjacent to the suture line in this region, and the surgical anastomosis is the most likely site of perforation. The colon is collapsed. There is mild thickening of colonic wall. Contrast is identified in the ascending colon following prior injection of contrast. Vascular/Lymphatic: Dense atherosclerotic calcification of the abdominal aorta. Reproductive: Prostate gland and seminal vesicles are normal in appearance. Distended urinary bladder. Bladder contains gas in may indicate recent catheterization. Other: Within the left lower quadrant, there is a rim enhancing collection which measures 4.4 x 10.2 x 16.7 cm. This collection abuts the anterior abdominal wall at the level of the iliac crest. Smaller similar collections are also identified throughout the pelvis. On the right, collection adjacent to the cecum is 4.9 x 4.3 cm. Small collection in the left mid abdomen is 2.9 x 5.0 cm. Adjacent to the sigmoid colon, there are small amounts of fluid containing contrast, consistent with bowel perforation. Small amounts of free air are also present along the anterior abdominal wall. Musculoskeletal: No acute or significant osseous findings. IMPRESSION: 1. Interval development of multiple rim enhancing collections consistent with abscesses. 2. Evidence for perforation adjacent to the gastrojejunostomy suture line.  3. Small amounts of fluid and dependent contrast in the pelvis. 4. Locules of gas are contiguous with the anterior abdominal wound consistent with a history of enteric contents visible in the wound site. 5. Stable bilateral pleural effusions. Electronically Signed: By: Nolon Nations M.D. On: 02/12/2016 19:54    Anti-infectives: Anti-infectives    Start     Dose/Rate Route Frequency Ordered Stop   02/12/16 2200  vancomycin (VANCOCIN) 1,500 mg in sodium chloride 0.9 % 500 mL IVPB     1,500 mg 250 mL/hr over 120 Minutes Intravenous Every 24 hours 02/12/16 1618     02/09/16 0200  vancomycin (VANCOCIN) IVPB 1000 mg/200 mL premix  Status:  Discontinued     1,000 mg 200 mL/hr over 60 Minutes Intravenous Every 12 hours 02/08/16 1300 02/12/16 1359   02/08/16 1400  vancomycin (VANCOCIN) IVPB 1000 mg/200 mL premix  Status:  Discontinued     1,000 mg 200 mL/hr over 60 Minutes Intravenous Every 12 hours 02/08/16 1259 02/08/16 1300   02/08/16 1300  vancomycin (VANCOCIN) IVPB 1000 mg/200 mL premix     1,000 mg 200 mL/hr over 60 Minutes Intravenous  Once 02/08/16 1217 02/08/16 1336   02/08/16 1200  piperacillin-tazobactam (ZOSYN) IVPB 3.375 g     3.375 g 12.5 mL/hr over 240 Minutes Intravenous Every 8 hours 02/08/16 1136     02/06/16 1415  piperacillin-tazobactam (ZOSYN) IVPB 3.375 g     3.375 g 100 mL/hr over 30 Minutes Intravenous  Once 02/06/16 1413 02/06/16 1409   01/26/16 2300  piperacillin-tazobactam (ZOSYN) IVPB 3.375 g  Status:  Discontinued     3.375 g 12.5 mL/hr over 240 Minutes Intravenous Every 8 hours 01/26/16 2256 02/04/16 0905   01/26/16 2230  vancomycin (VANCOCIN) IVPB 1000 mg/200 mL premix  Status:  Discontinued     1,000 mg 200 mL/hr over 60 Minutes Intravenous Every 12 hours 01/26/16 2227 01/26/16 2227   01/20/16 0400  vancomycin (VANCOCIN) IVPB 1000 mg/200 mL premix  Status:  Discontinued     1,000 mg 200 mL/hr over 60 Minutes Intravenous Every 12 hours 01/20/16 0323 01/21/16  0900   01/20/16 0330  ceFEPIme (MAXIPIME) 1 g in dextrose 5 % 50 mL IVPB     1 g 100 mL/hr over 30 Minutes Intravenous  Once 01/20/16 0317 01/20/16 0735      Assessment/Plan: Problem List: Patient Active Problem List   Diagnosis Date Noted  . Enterocutaneous fistula 02/13/2016  . Anemia of chronic disease 01/31/2016  . Stage 2 skin ulcer of sacral region 01/31/2016  . Hypomagnesemia 01/21/2016  . Hypokalemia 01/21/2016  . SBO (small bowel obstruction) 01/20/2016  . Pressure injury of skin 01/20/2016  . Tracheostomy care ([place])   . Pleural effusion   . Gastroesophageal reflux disease 01/19/2016  . Insomnia 01/19/2016  . Aspiration pneumonia ([place]) 01/17/2016  . On total parenteral nutrition (TPN) 01/17/2016  . Metabolic acidosis 68/09/8108  . Skin ulcer of abdominal wall, limited to breakdown of skin ([place]) 01/17/2016  . Dysphagia 01/17/2016  . Generalized weakness 01/17/2016  . 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 Vitamin B12 deficiency 01/02/2016  . BPH (benign prostatic hyperplasia)   . Posterior vitreous detachment   . Salzmann's nodular dystrophy of left eye   . Hyperopia of both eyes with astigmatism and presbyopia   . Central pterygium of right eye   . Pressure ulcer of buttock, right, unstageable ([place]) 12/30/2015  . Hypernatremia 12/28/2015  . Tracheostomy in place [facility]) 12/25/2015  . Tobacco abuse 12/09/2015  . Ischemic right index finger at tip 12/09/2015  . Gastritis 12/09/2015  . Gastric tube present ([facility]) 12/09/2015  . Acute respiratory failure with hypoxemia ([place])   . Protein-calorie malnutrition, severe 12/04/2015  . Encephalopathy acute 12/03/2015  . Pyloric stricture 11/24/2015  . Anxiety state 09/28/2015  . Hyperlipidemia   . 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 Coronary atherosclerosis of native coronary artery 04/24/2013  . Essential hypertension, benign 04/24/2013  . Other and unspecified  hyperlipidemia 04/24/2013  . Central pterygium 10/30/2012  . 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 Cataract, nuclear sclerotic senile 10/30/2012    Awaiting transfer to [facility] for percutaneous drainage of abdominal abscess 8 Days Post-Op    LOS: 25 days   Matt B. Hassell Done, MD, [facility] [phone] beeper [phone]  02/14/2016 8:57 AM

## 2016-02-15 LAB — CBC
HEMATOCRIT: 24 % — AB (ref 39.0–52.0)
Hemoglobin: 8 g/dL — ABNORMAL LOW (ref 13.0–17.0)
MCH: 29.7 pg (ref 26.0–34.0)
MCHC: 33.3 g/dL (ref 30.0–36.0)
MCV: 89.2 fL (ref 78.0–100.0)
Platelets: 648 10*3/uL — ABNORMAL HIGH (ref 150–400)
RBC: 2.69 MIL/uL — ABNORMAL LOW (ref 4.22–5.81)
RDW: 16 % — AB (ref 11.5–15.5)
WBC: 11.8 10*3/uL — ABNORMAL HIGH (ref 4.0–10.5)

## 2016-02-15 LAB — PHOSPHORUS: PHOSPHORUS: 3.9 mg/dL (ref 2.5–4.6)

## 2016-02-15 LAB — COMPREHENSIVE METABOLIC PANEL
ALK PHOS: 287 U/L — AB (ref 38–126)
ALT: 27 U/L (ref 17–63)
AST: 25 U/L (ref 15–41)
Albumin: 1.3 g/dL — ABNORMAL LOW (ref 3.5–5.0)
Anion gap: 4 — ABNORMAL LOW (ref 5–15)
BUN: 25 mg/dL — AB (ref 6–20)
CALCIUM: 8.2 mg/dL — AB (ref 8.9–10.3)
CO2: 26 mmol/L (ref 22–32)
CREATININE: 0.84 mg/dL (ref 0.61–1.24)
Chloride: 104 mmol/L (ref 101–111)
GFR calc Af Amer: >60 mL/min (ref >60)
Glucose, Bld: 126 mg/dL — ABNORMAL HIGH (ref 65–99)
Potassium: 3.8 mmol/L (ref 3.5–5.1)
SODIUM: 134 mmol/L — AB (ref 135–145)
Total Bilirubin: 0.3 mg/dL (ref 0.3–1.2)
Total Protein: 5.6 g/dL — ABNORMAL LOW (ref 6.5–8.1)

## 2016-02-15 LAB — DIFFERENTIAL
BASOS ABS: 0 10*3/uL (ref 0.0–0.1)
BASOS PCT: 0 %
EOS PCT: 2 %
Eosinophils Absolute: 0.2 10*3/uL (ref 0.0–0.7)
LYMPHS ABS: 3.1 10*3/uL (ref 0.7–4.0)
Lymphocytes Relative: 26 %
Monocytes Absolute: 1.7 10*3/uL — ABNORMAL HIGH (ref 0.1–1.0)
Monocytes Relative: 14 %
NEUTROS ABS: 6.8 10*3/uL (ref 1.7–7.7)
Neutrophils Relative %: 58 %

## 2016-02-15 LAB — GLUCOSE, CAPILLARY
GLUCOSE-CAPILLARY: 126 mg/dL — AB (ref 65–99)
Glucose-Capillary: 113 mg/dL — ABNORMAL HIGH (ref 65–99)
Glucose-Capillary: 119 mg/dL — ABNORMAL HIGH (ref 65–99)
Glucose-Capillary: 120 mg/dL — ABNORMAL HIGH (ref 65–99)

## 2016-02-15 LAB — PREALBUMIN: PREALBUMIN: 10 mg/dL — AB (ref 18–38)

## 2016-02-15 LAB — MAGNESIUM: Magnesium: 1.8 mg/dL (ref 1.7–2.4)

## 2016-02-15 LAB — TRIGLYCERIDES: Triglycerides: 145 mg/dL (ref <150)

## 2016-02-15 MED ORDER — TRACE MINERALS CR-CU-MN-SE-ZN 10-1000-500-60 MCG/ML IV SOLN
INTRAVENOUS | Status: AC
  Administered 2016-02-15: 18:00:00 via INTRAVENOUS
  Filled 2016-02-15: qty 1992

## 2016-02-15 MED ORDER — SODIUM CHLORIDE 0.9% FLUSH: 9.0000 mL | INTRAVENOUS | Status: DC | PRN

## 2016-02-15 MED ORDER — ENOXAPARIN SODIUM 40 MG/0.4ML ~~LOC~~ SOLN
40.0000 mg | SUBCUTANEOUS | Status: DC
Start: 2016-02-15 — End: 2016-02-26
  Administered 2016-02-15 – 2016-02-25 (×11): 40 mg via SUBCUTANEOUS
  Filled 2016-02-15 (×11): qty 0.4

## 2016-02-15 MED ORDER — MORPHINE SULFATE 2 MG/ML IV SOLN: INTRAVENOUS | Status: DC

## 2016-02-15 MED ORDER — FAT EMULSION 20 % IV EMUL
240.0000 mL | INTRAVENOUS | Status: AC
  Administered 2016-02-15: 240 mL via INTRAVENOUS
  Filled 2016-02-15: qty 250

## 2016-02-15 MED ORDER — NALOXONE HCL 0.4 MG/ML IJ SOLN: 0.4000 mg | INTRAMUSCULAR | Status: DC | PRN

## 2016-02-15 MED ORDER — DIPHENHYDRAMINE HCL 50 MG/ML IJ SOLN: 12.5000 mg | Freq: Four times a day (QID) | INTRAMUSCULAR | Status: DC | PRN

## 2016-02-15 MED ORDER — DIPHENHYDRAMINE HCL 12.5 MG/5ML PO ELIX: 12.5000 mg | ORAL_SOLUTION | Freq: Four times a day (QID) | ORAL | Status: DC | PRN

## 2016-02-15 MED ORDER — ONDANSETRON HCL 4 MG/2ML IJ SOLN
4.0000 mg | Freq: Four times a day (QID) | INTRAMUSCULAR | Status: DC | PRN
  Administered 2016-02-19: 4 mg via INTRAVENOUS

## 2016-02-15 MED ORDER — MORPHINE SULFATE 2 MG/ML IV SOLN
INTRAVENOUS | Status: DC
  Administered 2016-02-15: 6 mg via INTRAVENOUS
  Administered 2016-02-15: 18:00:00 via INTRAVENOUS
  Administered 2016-02-15: 5 mg via INTRAVENOUS
  Administered 2016-02-16: 15 mg via INTRAVENOUS
  Administered 2016-02-16: 7 mg via INTRAVENOUS
  Administered 2016-02-16: 4 mg via INTRAVENOUS
  Administered 2016-02-16: 9 mg via INTRAVENOUS
  Administered 2016-02-16: 13 mg via INTRAVENOUS
  Administered 2016-02-16: 13:00:00 via INTRAVENOUS
  Administered 2016-02-17: 8 mg via INTRAVENOUS
  Administered 2016-02-17: 7 mg via INTRAVENOUS
  Administered 2016-02-17: 11:00:00 via INTRAVENOUS
  Administered 2016-02-17: 6 mg via INTRAVENOUS
  Administered 2016-02-17: 2 mg via INTRAVENOUS
  Administered 2016-02-17: 8 mg via INTRAVENOUS
  Administered 2016-02-17: 13 mg via INTRAVENOUS
  Administered 2016-02-18: 8 mg via INTRAVENOUS
  Administered 2016-02-18: 6 mg via INTRAVENOUS
  Administered 2016-02-18: 2 mg via INTRAVENOUS
  Administered 2016-02-18: 5 mg via INTRAVENOUS
  Administered 2016-02-19: 1 mg via INTRAVENOUS
  Administered 2016-02-19: 11 mg via INTRAVENOUS
  Administered 2016-02-19: 6 mg via INTRAVENOUS
  Administered 2016-02-19: 13 mg via INTRAVENOUS
  Administered 2016-02-19: 2 mg via INTRAVENOUS
  Administered 2016-02-19: 8 mg via INTRAVENOUS
  Administered 2016-02-20: 6 mg via INTRAVENOUS
  Administered 2016-02-20: 5 mg via INTRAVENOUS
  Administered 2016-02-20: 9 mg via INTRAVENOUS
  Administered 2016-02-20: 16 mg via INTRAVENOUS
  Administered 2016-02-20: 13:00:00 via INTRAVENOUS
  Administered 2016-02-20: 6 mg via INTRAVENOUS
  Administered 2016-02-20: 8 mg via INTRAVENOUS
  Administered 2016-02-21: 17:00:00 via INTRAVENOUS
  Administered 2016-02-21: 11 mg via INTRAVENOUS
  Administered 2016-02-21: 9 mg via INTRAVENOUS
  Administered 2016-02-21: 2 mg via INTRAVENOUS
  Administered 2016-02-21: 4 mg via INTRAVENOUS
  Administered 2016-02-21: 3 mg via INTRAVENOUS
  Administered 2016-02-22: 2 mg via INTRAVENOUS
  Administered 2016-02-22: 7 mg via INTRAVENOUS
  Administered 2016-02-22: 9 mg via INTRAVENOUS
  Administered 2016-02-22: 4 mg via INTRAVENOUS
  Administered 2016-02-23: 1 mg via INTRAVENOUS
  Administered 2016-02-23: 18:00:00 via INTRAVENOUS
  Administered 2016-02-23: 7 mg via INTRAVENOUS
  Administered 2016-02-24: 3 mg via INTRAVENOUS
  Administered 2016-02-24: 8 mg via INTRAVENOUS
  Administered 2016-02-24: 23 mg via INTRAVENOUS
  Administered 2016-02-24: 13:00:00 via INTRAVENOUS
  Administered 2016-02-24: 2 mg via INTRAVENOUS
  Administered 2016-02-25: 10 mg via INTRAVENOUS
  Administered 2016-02-25: 16:00:00 via INTRAVENOUS
  Administered 2016-02-25: 7 mg via INTRAVENOUS
  Administered 2016-02-25: 9 mg via INTRAVENOUS
  Administered 2016-02-25: 24 mg via INTRAVENOUS
  Administered 2016-02-25: 24.5 mg via INTRAVENOUS
  Administered 2016-02-26 (×2): 8 mg via INTRAVENOUS
  Filled 2016-02-15 (×10): qty 25

## 2016-02-15 MED ORDER — ONDANSETRON HCL 4 MG/2ML IJ SOLN: 4.0000 mg | Freq: Four times a day (QID) | INTRAMUSCULAR | Status: DC | PRN

## 2016-02-15 NOTE — Progress Notes (Signed)
Physical Therapy Treatment Patient Details Name: Danny GalRalph T Hirschi MRN: [id number] DOB: [birth year] Today's Date: 02/15/2016    [facility]x N/V r/o ileus. S/P 02/06/16 forEXPLORATORY LAPAROTOMY, LYSIS OF ADHESIONS, SEGMENTAL SMALL BOWEL RESECTION, GASTROJEJUNOSTOMY 12/23    PT Comments    LOS 26 days POD # 9 LAP Max drainage from ABD incision.  RN changed dressing just before PT session and administered IV pain meds. Assisted OOB to recliner only this session due to pain and anxiety level.  When prompted to walk, pt repeated "not today" but did agree to get in recliner.  Positioned to comfort.     Follow Up Recommendations  SNF;Supervision/Assistance - 24 hour;Home health PT     Equipment Recommendations       Recommendations for Other Services       Precautions / Restrictions Precautions Precautions: Fall Precaution Comments: recent LAP with max ABD drainage, 2 drains, 1 bulb     TRACH collar Restrictions Weight Bearing Restrictions: No    Mobility  Bed Mobility Overal bed mobility: Needs Assistance Bed Mobility: Supine to Sit     Supine to sit: +2 for physical assistance;Mod assist;Max assist     General bed mobility comments: required increased assist due to recent LAP with large ABD incision  Transfers Overall transfer level: Needs assistance Equipment used: Rolling walker (2 wheeled) Transfers: Sit to/from [employer] CorporationStand;Stand Pivot Transfers Sit to Stand: Mod assist;+2 physical assistance;+2 safety/equipment         General transfer comment: off elevated bed 1/4 turn to recliner + 2 assist for safety  Ambulation/Gait             General Gait Details: pt declined amb.  Demonstarted increased anxiety and slightly increased agitation when repeatably prompted.     Stairs            Wheelchair Mobility    Modified Rankin (Stroke Patients Only)       Balance                                     Cognition Arousal/Alertness: Awake/alert Behavior During Therapy: WFL for tasks assessed/performed Overall Cognitive Status: Within Functional Limits for tasks assessed                 General Comments: needs MAX encouragement.  Pt quick to decline OOB activity    Exercises      General Comments        Pertinent Vitals/Pain Pain Assessment: Faces Faces Pain Scale: Hurts whole lot Pain Location: ABD Pain Descriptors / Indicators: Guarding;Operative site guarding;Tender;Sore Pain Intervention(s): Monitored during session;Repositioned;RN gave pain meds during session    Home Living                      Prior Function            PT Goals (current goals can now be found in the care plan section) Progress towards PT goals: Progressing toward goals    Frequency    Min 3X/week      PT Plan Current plan remains appropriate    Co-evaluation             End of Session Equipment Utilized During Treatment: Gait belt Activity Tolerance: Patient limited by pain Patient left: in chair;with family/visitor present;with call bell/phone within reach     Time: 1455-1535 PT Time Calculation (  min) (ACUTE ONLY): 40 min  Charges:  $Gait Training: 8-22 mins $Therapeutic Activity: 23-37 mins                    G Codes:      Rica Koyanagi  PTA [facility]  Acute  Rehab Pager      [phone]

## 2016-02-15 NOTE — Progress Notes (Signed)
Patient ID: [name], male   DOB: 05-03-1940, 76 y.o.   MRN: [id number] [facility] Surgery Progress Note:   9 Days Post-Op  Subjective: Mental status is fairly clear.  Had percutaneous drainage of abdominal abscess over at [facility] yesterday.   Objective: Vital signs in last 24 hours: Temp:  [97.8 F (36.6 C)-98 F (36.7 C)] 97.8 F (36.6 C) (01/01 0615) Pulse Rate:  [70-92] 72 (01/01 0834) Resp:  [15-21] 16 (01/01 0834) BP: (101-140)/(40-70) 138/55 (01/01 0615) SpO2:  [95 %-99 %] 97 % (01/01 0834) FiO2 (%):  [28 %] 28 % (01/01 0834) Weight:  [81.2 kg (179 lb 0.2 oz)] 81.2 kg (179 lb 0.2 oz) (01/01 0500)  Intake/Output from previous day: 12/31 0701 - 01/01 0700 In: 3033.6 [I.V.:2273.6; IV Piggyback:760] Out: 2650 [Urine:2300; Drains:350] Intake/Output this shift: No intake/output data recorded.  Physical Exam: Work of breathing is per tracheostomy.  Midline incision continues to drain and dressing changes occurring.    Lab Results:  Results for orders placed or performed during the hospital encounter of 01/19/16 (from the past 48 hour(s))  Protime-INR     Status: None   Collection Time: 02/13/16 11:40 AM  Result Value Ref Range   Prothrombin Time 15.0 11.4 - 15.2 seconds   INR 1.18   Glucose, capillary     Status: Abnormal   Collection Time: 02/13/16 12:20 PM  Result Value Ref Range   Glucose-Capillary 122 (H) 65 - 99 mg/dL  Glucose, capillary     Status: Abnormal   Collection Time: 02/13/16  6:08 PM  Result Value Ref Range   Glucose-Capillary 109 (H) 65 - 99 mg/dL  Glucose, capillary     Status: Abnormal   Collection Time: 02/14/16 12:04 AM  Result Value Ref Range   Glucose-Capillary 126 (H) 65 - 99 mg/dL  Glucose, capillary     Status: Abnormal   Collection Time: 02/14/16  6:16 AM  Result Value Ref Range   Glucose-Capillary 127 (H) 65 - 99 mg/dL  Glucose, capillary     Status: Abnormal   Collection Time: 02/14/16 11:51 AM  Result Value Ref Range    Glucose-Capillary 116 (H) 65 - 99 mg/dL  Glucose, capillary     Status: Abnormal   Collection Time: 02/14/16  5:38 PM  Result Value Ref Range   Glucose-Capillary 108 (H) 65 - 99 mg/dL  Glucose, capillary     Status: Abnormal   Collection Time: 02/14/16 11:59 PM  Result Value Ref Range   Glucose-Capillary 117 (H) 65 - 99 mg/dL  Comprehensive metabolic panel     Status: Abnormal   Collection Time: 02/15/16  3:46 AM  Result Value Ref Range   Sodium 134 (L) 135 - 145 mmol/L   Potassium 3.8 3.5 - 5.1 mmol/L   Chloride 104 101 - 111 mmol/L   CO2 26 22 - 32 mmol/L   Glucose, Bld 126 (H) 65 - 99 mg/dL   BUN 25 (H) 6 - 20 mg/dL   Creatinine, Ser 0.84 0.61 - 1.24 mg/dL   Calcium 8.2 (L) 8.9 - 10.3 mg/dL   Total Protein 5.6 (L) 6.5 - 8.1 g/dL   Albumin 1.3 (L) 3.5 - 5.0 g/dL   AST 25 15 - 41 U/L   ALT 27 17 - 63 U/L   Alkaline Phosphatase 287 (H) 38 - 126 U/L   Total Bilirubin 0.3 0.3 - 1.2 mg/dL   GFR calc non Af Amer >60 >60 mL/min   GFR calc Af Amer >60 >  60 mL/min    Comment: (NOTE) The eGFR has been calculated using the CKD EPI equation. This calculation has not been validated in all clinical situations. eGFR's persistently <60 mL/min signify possible Chronic Kidney Disease.    Anion gap 4 (L) 5 - 15  Magnesium     Status: None   Collection Time: 02/15/16  3:46 AM  Result Value Ref Range   Magnesium 1.8 1.7 - 2.4 mg/dL  Phosphorus     Status: None   Collection Time: 02/15/16  3:46 AM  Result Value Ref Range   Phosphorus 3.9 2.5 - 4.6 mg/dL  CBC     Status: Abnormal   Collection Time: 02/15/16  3:46 AM  Result Value Ref Range   WBC 11.8 (H) 4.0 - 10.5 K/uL   RBC 2.69 (L) 4.22 - 5.81 MIL/uL   Hemoglobin 8.0 (L) 13.0 - 17.0 g/dL   HCT 24.0 (L) 39.0 - 52.0 %   MCV 89.2 78.0 - 100.0 fL   MCH 29.7 26.0 - 34.0 pg   MCHC 33.3 30.0 - 36.0 g/dL   RDW 16.0 (H) 11.5 - 15.5 %   Platelets 648 (H) 150 - 400 K/uL  Differential     Status: Abnormal   Collection Time: 02/15/16  3:46 AM   Result Value Ref Range   Neutrophils Relative % 58 %   Lymphocytes Relative 26 %   Monocytes Relative 14 %   Eosinophils Relative 2 %   Basophils Relative 0 %   Neutro Abs 6.8 1.7 - 7.7 K/uL   Lymphs Abs 3.1 0.7 - 4.0 K/uL   Monocytes Absolute 1.7 (H) 0.1 - 1.0 K/uL   Eosinophils Absolute 0.2 0.0 - 0.7 K/uL   Basophils Absolute 0.0 0.0 - 0.1 K/uL   WBC Morphology TOXIC GRANULATION   Triglycerides     Status: None   Collection Time: 02/15/16  3:46 AM  Result Value Ref Range   Triglycerides 145 <150 mg/dL    Comment: Performed at [facility]  Prealbumin     Status: Abnormal   Collection Time: 02/15/16  3:46 AM  Result Value Ref Range   Prealbumin 10.0 (L) 18 - 38 mg/dL    Comment: Performed at [facility]  Glucose, capillary     Status: Abnormal   Collection Time: 02/15/16  6:08 AM  Result Value Ref Range   Glucose-Capillary 126 (H) 65 - 99 mg/dL    Radiology/Results: Ct Image Guided Drainage By Percutaneous Catheter  Result Date: 02/14/2016 CLINICAL DATA:  Intra-abdominal peritoneal abscess after prior gastric surgery, laparoscopic gastrostomy and small bowel resection. EXAM: CT GUIDED CATHETER DRAINAGE OF LEFT LOWER QUADRANT PERITONEAL ABSCESS ANESTHESIA/SEDATION: 1.0 mg IV Versed 50 mcg IV Fentanyl Total Moderate Sedation Time:  34 minutes The patient's level of consciousness and physiologic status were continuously monitored during the procedure by Radiology nursing. PROCEDURE: The procedure, risks, benefits, and alternatives were explained to the patient's wife. Questions regarding the procedure were encouraged and answered. The patient's wife understands and consents to the procedure. A time out was performed prior to initiating the procedure. The left lower abdominal wall was prepped with chlorhexidine in a sterile fashion, and a sterile drape was applied covering the operative field. A sterile gown and sterile gloves were used for the procedure. Local  anesthesia was provided with 1% Lidocaine. Under CT guidance, an 18 gauge trocar needle was advanced into a left lower quadrant peritoneal fluid collection. After return of fluid, a sample was collected for culture  analysis. A guidewire was then advanced through the needle. The tract was dilated over the wire. A 12 French percutaneous drainage catheter was then advanced. Catheter positioning was confirmed by CT. The catheter was connected to a suction bulb. It was secured at skin with a Prolene retention suture and StatLock device. COMPLICATIONS: None FINDINGS: Aspiration at the level of left lower quadrant fluid collection yielded bloody and slightly turbid fluid. This region of fluid appears to communicate with the majority of intraabdominal fluid seen by the most recent CT study. Decision was made to currently place a single percutaneous drainage catheter. IMPRESSION: CT-guided percutaneous drainage of left lower quadrant peritoneal abscess with placement of 12 French percutaneous drainage catheter. The catheter was attached to suction bulb drainage. A fluid sample was sent for culture analysis. Electronically Signed   By: Aletta Edouard M.D.   On: 02/14/2016 11:33    Anti-infectives: Anti-infectives    Start     Dose/Rate Route Frequency Ordered Stop   02/12/16 2200  vancomycin (VANCOCIN) 1,500 mg in sodium chloride 0.9 % 500 mL IVPB     1,500 mg 250 mL/hr over 120 Minutes Intravenous Every 24 hours 02/12/16 1618     02/09/16 0200  vancomycin (VANCOCIN) IVPB 1000 mg/200 mL premix  Status:  Discontinued     1,000 mg 200 mL/hr over 60 Minutes Intravenous Every 12 hours 02/08/16 1300 02/12/16 1359   02/08/16 1400  vancomycin (VANCOCIN) IVPB 1000 mg/200 mL premix  Status:  Discontinued     1,000 mg 200 mL/hr over 60 Minutes Intravenous Every 12 hours 02/08/16 1259 02/08/16 1300   02/08/16 1300  vancomycin (VANCOCIN) IVPB 1000 mg/200 mL premix     1,000 mg 200 mL/hr over 60 Minutes Intravenous  Once  02/08/16 1217 02/08/16 1336   02/08/16 1200  piperacillin-tazobactam (ZOSYN) IVPB 3.375 g     3.375 g 12.5 mL/hr over 240 Minutes Intravenous Every 8 hours 02/08/16 1136     02/06/16 1415  piperacillin-tazobactam (ZOSYN) IVPB 3.375 g     3.375 g 100 mL/hr over 30 Minutes Intravenous  Once 02/06/16 1413 02/06/16 1409   01/26/16 2300  piperacillin-tazobactam (ZOSYN) IVPB 3.375 g  Status:  Discontinued     3.375 g 12.5 mL/hr over 240 Minutes Intravenous Every 8 hours 01/26/16 2256 02/04/16 0905   01/26/16 2230  vancomycin (VANCOCIN) IVPB 1000 mg/200 mL premix  Status:  Discontinued     1,000 mg 200 mL/hr over 60 Minutes Intravenous Every 12 hours 01/26/16 2227 01/26/16 2227   01/20/16 0400  vancomycin (VANCOCIN) IVPB 1000 mg/200 mL premix  Status:  Discontinued     1,000 mg 200 mL/hr over 60 Minutes Intravenous Every 12 hours 01/20/16 0323 01/21/16 0900   01/20/16 0330  ceFEPIme (MAXIPIME) 1 g in dextrose 5 % 50 mL IVPB     1 g 100 mL/hr over 30 Minutes Intravenous  Once 01/20/16 0317 01/20/16 0735      Assessment/Plan: Problem List: Patient Active Problem List   Diagnosis Date Noted  . Enterocutaneous fistula 02/13/2016  . Anemia of chronic disease 01/31/2016  . Stage 2 skin ulcer of sacral region 01/31/2016  . Hypomagnesemia 01/21/2016  . Hypokalemia 01/21/2016  . SBO (small bowel obstruction) 01/20/2016  . Pressure injury of skin 01/20/2016  . Tracheostomy care ([place])   . Pleural effusion   . Gastroesophageal reflux disease 01/19/2016  . Insomnia 01/19/2016  . Aspiration pneumonia ([place]) 01/17/2016  . On total parenteral nutrition (TPN) 01/17/2016  . Metabolic acidosis 03/54/6568  .  Skin ulcer of abdominal wall, limited to breakdown of skin ([place]) 01/17/2016  . Dysphagia 01/17/2016  . Generalized weakness 01/17/2016  . 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 Vitamin B12 deficiency 01/02/2016  . BPH (benign prostatic hyperplasia)   . Posterior vitreous detachment   .  Salzmann's nodular dystrophy of left eye   . Hyperopia of both eyes with astigmatism and presbyopia   . Central pterygium of right eye   . Pressure ulcer of buttock, right, unstageable ([place]) 12/30/2015  . Hypernatremia 12/28/2015  . Tracheostomy in place [facility]) 12/25/2015  . Tobacco abuse 12/09/2015  . Ischemic right index finger at tip 12/09/2015  . Gastritis 12/09/2015  . Gastric tube present ([facility]) 12/09/2015  . Acute respiratory failure with hypoxemia ([place])   . Protein-calorie malnutrition, severe 12/04/2015  . Encephalopathy acute 12/03/2015  . Pyloric stricture 11/24/2015  . Anxiety state 09/28/2015  . Hyperlipidemia   . 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 Coronary atherosclerosis of native coronary artery 04/24/2013  . Essential hypertension, benign 04/24/2013  . Other and unspecified hyperlipidemia 04/24/2013  . Central pterygium 10/30/2012  . 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 Cataract, nuclear sclerotic senile 10/30/2012    Percutaneous drainage of abscess at [facility] yesterday (CT down at [facility]).  Continues on TNA 9 Days Post-Op    LOS: 26 days   Matt B. Hassell Done, MD, [facility] [phone] beeper [phone]  02/15/2016 9:21 AM

## 2016-02-15 NOTE — Progress Notes (Signed)
Patient complaining of increased pain today along with what appears to be an increase in drainage from midline incision.  Dressing is needing to be changed every couple of hours at the minimum.  Both Drs. Wakefield and Dr. Daphine DeutscherMartin are aware.  [facility] and [facility] have been placed.  Patient is much more comfortable at this time.  Barrier cream has been used to prevent skin breakdown in between dressing changes.  Will continue to monitor patient.

## 2016-02-15 NOTE — Progress Notes (Signed)
PHARMACY - ADULT TOTAL PARENTERAL NUTRITION CONSULT NOTE   Pharmacy Consult for TPN Indication: PTA TPN continuation; bowel perforation  Insulin Requirements:  SSI:  2 units Novolog sensitive SSI in past 24 hours TPN: 10 units regular insulin per day in TPN   Current Nutrition:  - NPO - Clinmix-E 5/20 at 83 mL/hr, Fat Emulsion 20% at 10 mL/hr  IVF: off  Central access: PICC TPN start date: PTA, restarted on admission on 12/6    Recent Labs  02/13/16 0330 02/15/16 0346  NA 134* 134*  K 3.8 3.8  CL 103 104  CO2 25 26  GLUCOSE 112* 126*  BUN 25* 25*  CREATININE 0.68 0.84  CALCIUM 8.0* 8.2*  PHOS 3.9 3.9  MG 1.8 1.8  ALBUMIN 1.2* 1.3*  ALKPHOS 257* 287*  AST 30 25  ALT 32 27  BILITOT 0.3 0.3  TRIG  --  145  PREALBUMIN  --  10.0*  Corr Ca: 10.2 on 12/30  ASSESSMENT                                                                                                          HPI: 85 yoM with hx PUD, CAD, GERD, BPH,  s/p multiple procedures  and extended hospitalization for recurrent partial GOO 2/2 pyloric stricture on TPN for several days, discharged 11/15 with J/G tube in place on TFs, unable to tolerate and had to transition back to TPN via [facility] on 11/22, readmitted 12/6 with partial SBO. Pharmacy consulted to resume TPN. Note: PTA TNA from [place] was as follows: Totals per day: 405 grams (1376 kcal)  of dextrose, 75 grams (300 kcal) of protein and 50 grams ( 480 kcal) of lipid for a total of 2156 kcal/day   Significant events:  12/6: TPN continued on admission.  12/7: Pt pulled out NGT, not replaced, remains NPO 12/11: Awaiting MBS, if no aspiration, will start to advance diet.  Having bowel function.  Continue TPN for now.   12/12: Severe aspiration risk per MBS yesterday - recommend continuing NPO. Osmolite 1.2 CAL via TF BID ordered 12/11 at 6pm. Per discussion with CCS PA, continue TPN at current goal rate today, wean tomorrow if tolerates TF. 12/13: Did not  tolerate bolus feeds through G-tube; overnight emesis and aspiration event, started on Zosyn. CCS trying trickle feeds today. 12/14 transition to cyclic TPN - appears recovery may be prolonged. Not tolerating trickle feeds; G-tube to drainage.  12/15: TPN was not run at cyclic rate overnight d/t conflicting information in TPN order. To provide patient full needed nutrition, will continue to run TPN at 83 ml/hr over 24 hr and begin 18-hr cycle with tonight's TPN 12/16: start 12h TPN cycle 12/18: resume trickle tube feed at 5 ml/hr 12/19: Nausea/vomiting, tube feed held. TPN formula changed to E 5/20 from E 5/15 12/21: successful transition of G tube to [place] tube. Plan scan for patency before resuming tube feeds.  12/22: Upper GI scan in IR today to ensure J-tube working.  12/23: exp lap, lysis of adhesions, segmental  small bowel resection, gastrojejunostomy 12/25: TPN converted from cyclic back to continuous due to hypoglycemic event following end of TPN cycle and patient likely to remain hospitalized for at least several more days.  Prealbumin low, falling - Fat Emulsion increased to 10 mL/hr.  Clinimix already at 2L/day. 12/28: to start trickle feeds thru J-tube; G-tube remains on drainage 12/29: TF increased from 10 ml/hr to 20 ml/hr  12/29 PM: TF was found draining from midline incision, CT abdomen c/w bowel perforation at G-J suture site; interval development of abscesses .  TF was stopped and is to remain off until MD orders otherwise. 12/31: Plan percutaneous drainage of largest LLQ fluid collection today in IR.  Because of construction at [facility], procedure will be performed at [facility] and patient will be returned to [facility] post-procedure.   Today, 02/15/2016:   Glucose within goal range (100-150). No hx DM.[name]   Electrolytes (12/30): Na slightly low, others WNL  Renal (12/30) - SCr WNL, BUN elevated but decreasing.    LFTs (12/30):  Alk phos elevated (~2x ULN).  All others < ULN.  TGs (12/24):  WNL  Prealbumin:  15.3 ( 11/13), 10.8 (12/7), 12.8 (12/11), 13.1 (12/18), 8.9 (12/25) - falling  NUTRITIONAL GOALS                                                                                             RD recs (12/26): Kcal 2000-2200, Protein 100-115g, Fluid 2L/day [facility] TPN: 2156 Kcal/day, Protein 75g/day  Clinimix E 5/20 at a goal rate of 83 ml/hr + 20% fat emulsion at 10 ml/hr to provide: 100 g/day protein, 2240 Kcal/day. (+++There is currently a Psychologist, prison and probation services of Clinimix solution.  Pharmacy will use Clinimix product based on what we have available in stock+++)  PLAN                                                                                   At 1800 today:  Continue Clinimix E 5/20 at 83 ml/hr.  Continue 20% fat emulsion at 10 ml/hr.  Continue 10 units regular insulin per day in TPN.   TPN to contain standard multivitamins and trace elements.  Enteral feeds to remain off due to bowel leak  Continue sensitive SSI and CBG checks q6h.   TPN lab panels on Mondays & Thursdays  Labs stable but will recheck CMP/Mg/Phos tomorrow d/t changes in TF status.  Follow clinical course daily.  Reuel Boom, PharmD, BCPS Pager: [phone] 02/15/2016, 10:20 AM

## 2016-02-16 LAB — MAGNESIUM: MAGNESIUM: 1.7 mg/dL (ref 1.7–2.4)

## 2016-02-16 LAB — BASIC METABOLIC PANEL
ANION GAP: 5 (ref 5–15)
BUN: 24 mg/dL — AB (ref 6–20)
CO2: 25 mmol/L (ref 22–32)
Calcium: 8.3 mg/dL — ABNORMAL LOW (ref 8.9–10.3)
Chloride: 104 mmol/L (ref 101–111)
Creatinine, Ser: 0.63 mg/dL (ref 0.61–1.24)
GFR calc Af Amer: >60 mL/min (ref >60)
Glucose, Bld: 121 mg/dL — ABNORMAL HIGH (ref 65–99)
POTASSIUM: 3.6 mmol/L (ref 3.5–5.1)
SODIUM: 134 mmol/L — AB (ref 135–145)

## 2016-02-16 LAB — CBC
HCT: 24.9 % — ABNORMAL LOW (ref 39.0–52.0)
Hemoglobin: 8.2 g/dL — ABNORMAL LOW (ref 13.0–17.0)
MCH: 29.5 pg (ref 26.0–34.0)
MCHC: 32.9 g/dL (ref 30.0–36.0)
MCV: 89.6 fL (ref 78.0–100.0)
Platelets: 706 10*3/uL — ABNORMAL HIGH (ref 150–400)
RBC: 2.78 MIL/uL — AB (ref 4.22–5.81)
RDW: 15.8 % — ABNORMAL HIGH (ref 11.5–15.5)
WBC: 13.2 10*3/uL — ABNORMAL HIGH (ref 4.0–10.5)

## 2016-02-16 LAB — GLUCOSE, CAPILLARY
GLUCOSE-CAPILLARY: 107 mg/dL — AB (ref 65–99)
GLUCOSE-CAPILLARY: 109 mg/dL — AB (ref 65–99)
Glucose-Capillary: 112 mg/dL — ABNORMAL HIGH (ref 65–99)

## 2016-02-16 LAB — PHOSPHORUS: PHOSPHORUS: 4.2 mg/dL (ref 2.5–4.6)

## 2016-02-16 MED ORDER — TRACE MINERALS CR-CU-MN-SE-ZN 10-1000-500-60 MCG/ML IV SOLN
INTRAVENOUS | Status: AC
  Administered 2016-02-16: 18:00:00 via INTRAVENOUS
  Filled 2016-02-16: qty 1992

## 2016-02-16 MED ORDER — FAT EMULSION 20 % IV EMUL
240.0000 mL | INTRAVENOUS | Status: AC
  Administered 2016-02-16: 240 mL via INTRAVENOUS
  Filled 2016-02-16: qty 240

## 2016-02-16 NOTE — Progress Notes (Signed)
Nutrition Follow-up  DOCUMENTATION CODES:   Severe malnutrition in context of acute illness/injury  INTERVENTION:  - Continue TPN per Pharmacy. - RD will follow-up 1/5.  NUTRITION DIAGNOSIS:   Inadequate oral intake related to inability to eat as evidenced by NPO status. -ongoing  GOAL:   Patient will meet greater than or equal to 90% of their needs -met with TPN regimen.   MONITOR:   Labs, Weight trends, Skin, I & O's, Other (Comment) (TPN, TF initiation?)  ASSESSMENT:   76 year-old male with a medical history of GERD, PUD, and CAD who presented to [facility] from [facility] via EMS with a cc of nausea, vomiting, and diarrhea. Patient is s/p Robotic anterior and posterior vagotomy, BI anastomosis, DOR fundoplication, omentopexy 09/2015 for partial GOO secondary to pyloric stricture, followed by placement of feeding jejunostomy and gastrostomy tube and EGD balloon dilation of duodenal stricture 12/01/15 for recurrent GOO secondary to edema at anastomosis and failure to thrive with malnutrition. On 12/03/15 the patient was taken to the OR for a diagnostic laparoscopy, omentopexy of jejunal disruption, and washout for jejunal disruption (pt pulled out jejunostomy tube) and gastric leak.   1/2 Pt remains on continuous TPN: Clinimix E 5/20 @ 83 mL/hr with 20% ILE @ 10 mL/hr which is providing 2240 kcal and 100 grams of protein to meet 100% estimated nutrition needs.  On 12/31 pt was transferred to [facility] for percutaneous drainage of abdominal abscess an 12 French drain was placed at that time. Weight has fluctuated frequently. Most recently, weight -4.7 kg over 5 days; will continue to monitor weight closely.   Pt denies any discomfort at time of RD visit earlier this AM. Wife and daughter were at bedside and were interacting with pt so visit was brief.   Medications reviewed; sliding scale Novolog, PRN Zofran, 40 mg IV Protonix/day, PRN IV Compazine. Labs  reviewed; CBGs: 107 and 109 mg/dL, Na: 134 mmol/L, BUN: 34 mg/dL, Ca: 8.3 mg/dL, Alk Phos elevated.     12/26 Per chart review, TPN will be converted back to continuous TPN infusion d/t hypoglycemic events. Clinimix-E 5/20 at 83 mL/hr + Fat Emulsion 20% at 10 mL/hr provides 100 g protein / day and 2240 KCal / day  Will monitor for TF trial via J-tube. Tube Feeding Recommendations: Initiate Vital 1.5@ 62m/hr via J-tubeand increase by 10 ml every 24hours to goal rate of 669mhr.  Tube feeding regimen provides 2160kcal (100% of needs), 97grams of protein, and 110020mf H2O.   Plan per Pharmacy 12/26: At 1800 today:  Continue Clinimix-E 5/20 at 83 mL/hr (utilizes entire 2 L bag)  Continue lipid emulsion 20% at 10 ml/hr (increased yesterday due to falling prealbumin)   Diet Order:  .TPN (CLINIMIX-E) Adult Diet NPO time specified .TPN (CLINIMIX-E) Adult  Skin:   Abdominal wounds from 12/23 and 12/31  Last BM:  12/25  Height:   Ht Readings from Last 1 Encounters:  01/21/16 '6\' 2"'$  (1.88 m)    Weight:   Wt Readings from Last 1 Encounters:  02/16/16 177 lb 4 oz (80.4 kg)    Ideal Body Weight:  86.4 kg  BMI:  Body mass index is 22.76 kg/m.  Estimated Nutritional Needs:   Kcal:  2000-2200  Protein:  100-115g  Fluid:  2L/day  EDUCATION NEEDS:   No education needs identified at this time    JesJarome MatinS, RD, LDN, CNSC Inpatient Clinical Dietitian Pager # [phone] hours/weekend pager #  [phone]

## 2016-02-16 NOTE — Consult Note (Cosign Needed)
WOC Nurse wound consult note Reason for Consult:new EC fistula from midline wound Wound type: EC fistula in full thickness surgical incision Pressure Ulcer POA: No Measurement:19cm x 2cm x 2cm Wound bed:Red, moist with dark yellow to green effluent in a moderate to large amount (saturating old dressings) Drainage (amount, consistency, odor) See above.  Thick, green/yellow Periwound:macerated, erythematous to 3cm Dressing procedure/placement/frequency:  1. Cleanse skin with NS, pat gently dry.   2. Cut and stretch skin barrier rings Hart [place] [phone]) and affix to skin as "border" around wound. Overlap as required. Fill defect at umbilicus with skin barrier ring segment.   3. Cut out Large [place] pouch Hart [place] # [phone]) according to pattern ensuring that pattern is correctly placed. (Drainage spout should be at left.)  4. Apply pouch; use gentle warming pressure of hands to ensure a good seal (should see condensation "cloud" over wound).  5. Apply 1-inch paper tape as border for pouch prior to attaching to straight drain.  Note: Bedside nursing staff to change pouch IMMEDIATELY upon noting leakage. WOC nursing team will follow at intervals, and will remain available to this patient, the nursing, surgical  and medical teams.   Thanks, Ladona MowLaurie Kanen Mottola, MSN, RN, GNP, Hans EdenCWOCN, CWON-AP, FAAN  Pager# [phone]

## 2016-02-16 NOTE — Progress Notes (Signed)
Wasted 2mL of Morphine from PCA syringe in the sink witnessed by Ernst BreachMarissa Long, RN

## 2016-02-16 NOTE — Progress Notes (Signed)
Patient ID: [name], male   DOB: 1940/03/25, 76 y.o.   MRN: [id number]    Referring Physician(s): Dr. Luretha Murphy  Supervising Physician: Simonne Come  Patient Status: [facility] - In-pt  Chief Complaint: LLQ abdominal abscess  Subjective: Patient doesn't feel great.  States drain is working well. No pain from drain.  Allergies: Flomax [tamsulosin hcl]; Lisinopril; Lorazepam; and Uroxatral [alfuzosin hcl er]  Medications: Prior to Admission medications   Medication Sig Start Date End Date Taking? Authorizing Provider  aluminum-magnesium hydroxide-simethicone (MAALOX) 200-200-20 MG/5ML SUSP Take 30 mLs by mouth every 6 (six) hours as needed.   Yes Historical Provider, MD  Amino Ac Elect-Calc in D10W (CLINIMIX E/DEXTROSE, 2.75/10,) 2.75 % SOLN Inject 2,160 mLs into the vein as needed.   Yes Historical Provider, MD  aspirin 81 MG chewable tablet Place 81 mg into feeding tube every evening.   Yes Historical Provider, MD  atorvastatin (LIPITOR) 40 MG tablet Place 40 mg into feeding tube at bedtime.    Yes Historical Provider, MD  bisacodyl (DULCOLAX) 10 MG suppository Place 10 mg rectally daily as needed for moderate constipation or severe constipation.   Yes Historical Provider, MD  bismuth subsalicylate (PEPTO BISMOL) 262 MG/15ML suspension Place 30 mLs into feeding tube every 8 (eight) hours as needed for indigestion or diarrhea or loose stools.   Yes Historical Provider, MD  calcium-vitamin D (OSCAL WITH D) 500-200 MG-UNIT tablet Place 1 tablet into feeding tube daily with breakfast.    Yes Historical Provider, MD  Cyanocobalamin (VITAMIN B-12) 1000 MCG SUBL Place 1 tablet under the tongue daily with breakfast.   Yes Historical Provider, MD  Ferrous Sulfate 220 (44 Fe) MG/5ML LIQD Place 7.5 mLs into feeding tube at bedtime.    Yes Historical Provider, MD  FLUoxetine (PROZAC) 20 MG/5ML solution Place 20 mg into feeding tube daily.   Yes Historical Provider, MD  haloperidol (HALDOL)  0.5 MG tablet Take 0.25-0.5 mg by mouth 3 (three) times daily. At 6 am and 4 PM and 9 PM   Yes Historical Provider, MD  haloperidol (HALDOL) 0.5 MG tablet Place 0.5 mg into feeding tube as needed for agitation. STOP DATE 01/24/2016 01/10/16 01/24/16 Yes Historical Provider, MD  loperamide (IMODIUM) 1 MG/5ML solution 10 mg. Take 10 mls via tube every 8 hours as needed for more than 2 bms every 8 hours.   Yes Historical Provider, MD  magic mouthwash SOLN Take 15 mLs by mouth 4 (four) times daily as needed for mouth pain (sore throat). 12/30/15  Yes Karie Soda, MD  metoCLOPramide (REGLAN) 5 MG/5ML solution Place 10 mg into feeding tube every 6 (six) hours as needed for nausea or vomiting.   Yes Historical Provider, MD  metoprolol tartrate (LOPRESSOR) 25 MG tablet Place 25 mg into feeding tube daily.   Yes Historical Provider, MD  nicotine (NICODERM CQ - DOSED IN MG/24 HR) 7 mg/24hr patch Place 7 mg onto the skin daily. Rotate site   Yes Historical Provider, MD  nitroGLYCERIN (NITROSTAT) 0.4 MG SL tablet Place 0.4 mg under the tongue every 5 (five) minutes as needed for chest pain.   Yes Historical Provider, MD  Nutritional Supplements (FEEDING SUPPLEMENT, OSMOLITE 1.2 CAL,) LIQD Place 237 mLs into feeding tube. Give 1 can via G-tube every day at  10 am and  6 pm.   Yes Historical Provider, MD  omeprazole (PRILOSEC) 40 MG capsule Take 40 mg by mouth 2 (two) times daily.   Yes Historical Provider, MD  Polyvinyl  Alcohol-Povidone (REFRESH OP) Place 2 drops into both eyes as needed (For dry eyes.).    Yes Historical Provider, MD  potassium chloride (KCL) 2 mEq/mL SOLN oral liquid Take 30 mEq by mouth daily.   Yes Historical Provider, MD  RESTASIS 0.05 % ophthalmic emulsion Place 1 drop into both eyes 2 (two) times daily.  04/16/13  Yes Historical Provider, MD  traZODone (DESYREL) 50 MG tablet Place 100 mg into feeding tube at bedtime.    Yes Historical Provider, MD  Water For Irrigation, Sterile (STERILE WATER  FOR IRRIGATION) Irrigate with 250 mLs as directed every 6 (six) hours.    Historical Provider, MD    Vital Signs: BP (!) 133/56 (BP Location: Left Arm)   Pulse 84   Temp 97.8 F (36.6 C) (Oral)   Resp 14   Ht 6\' 2"  (1.88 m)   Wt 177 lb 4 oz (80.4 kg)   SpO2 96%   BMI 22.76 kg/m   Physical Exam: Abd: soft, midline wound is covered and dressed.  LLQ drain in place with enteric contents present within the bulb.  40cc/24hrs.  Drain site is c/d/i with no erythema present.  Imaging: Ct Abdomen Pelvis W Contrast  Addendum Date: 02/12/2016   ADDENDUM REPORT: 02/12/2016 20:07 ADDENDUM: The salient findings were discussed with Dr. Daphine DeutscherMartin on 02/12/2016 at 7:55 p.m. Electronically Signed   By: Norva PavlovElizabeth  Brown M.D.   On: 02/12/2016 20:07   Result Date: 02/12/2016 CLINICAL DATA:  Drainage from abdominal wound. Enteric contents coming out of midline incision. EXAM: CT ABDOMEN AND PELVIS WITH CONTRAST TECHNIQUE: Multidetector CT imaging of the abdomen and pelvis was performed using the standard protocol following bolus administration of intravenous contrast. CONTRAST:  30mL ISOVUE-300 IOPAMIDOL (ISOVUE-300) INJECTION 61%, 100mL ISOVUE-300 IOPAMIDOL (ISOVUE-300) INJECTION 61% COMPARISON:  02/10/2016, 01/20/2016 FINDINGS: Lower chest: There are bilateral pleural effusions, similar to the prior study. Bibasilar atelectasis is present. Heart size is normal. No imaged pericardial effusion or significant coronary artery calcifications. Hepatobiliary: Status post cholecystectomy. Mild biliary duct dilatation. Pancreas: Unremarkable. No pancreatic ductal dilatation or surrounding inflammatory changes. Spleen: Normal in size without focal abnormality. Adrenals/Urinary Tract: Adrenal glands are normal. There is mild prominence of the collecting systems of the kidneys bilaterally, likely related to the distended urinary bladder. No urinary tract calculi are identified. Stomach/Bowel: There is a percutaneous feeding  tube in place, injuring the stomach through the left anterior abdominal wall. Status post antrectomy of the stomach and gastric duodenostomy. The feeding tube is intraluminal in a right upper quadrant jejunal loop. Sutures are identified within the region region of the gastroduodenal ostomy. Small locules of gas are identified within this region and are contiguous with the anterior abdominal wall wound. Small amounts of contrast are identified adjacent to the suture line in this region, and the surgical anastomosis is the most likely site of perforation. The colon is collapsed. There is mild thickening of colonic wall. Contrast is identified in the ascending colon following prior injection of contrast. Vascular/Lymphatic: Dense atherosclerotic calcification of the abdominal aorta. Reproductive: Prostate gland and seminal vesicles are normal in appearance. Distended urinary bladder. Bladder contains gas in may indicate recent catheterization. Other: Within the left lower quadrant, there is a rim enhancing collection which measures 4.4 x 10.2 x 16.7 cm. This collection abuts the anterior abdominal wall at the level of the iliac crest. Smaller similar collections are also identified throughout the pelvis. On the right, collection adjacent to the cecum is 4.9 x 4.3 cm. Small collection  in the left mid abdomen is 2.9 x 5.0 cm. Adjacent to the sigmoid colon, there are small amounts of fluid containing contrast, consistent with bowel perforation. Small amounts of free air are also present along the anterior abdominal wall. Musculoskeletal: No acute or significant osseous findings. IMPRESSION: 1. Interval development of multiple rim enhancing collections consistent with abscesses. 2. Evidence for perforation adjacent to the gastrojejunostomy suture line. 3. Small amounts of fluid and dependent contrast in the pelvis. 4. Locules of gas are contiguous with the anterior abdominal wound consistent with a history of enteric  contents visible in the wound site. 5. Stable bilateral pleural effusions. Electronically Signed: By: Norva Pavlov M.D. On: 02/12/2016 19:54   Ct Image Guided Drainage By Percutaneous Catheter  Result Date: 02/14/2016 CLINICAL DATA:  Intra-abdominal peritoneal abscess after prior gastric surgery, laparoscopic gastrostomy and small bowel resection. EXAM: CT GUIDED CATHETER DRAINAGE OF LEFT LOWER QUADRANT PERITONEAL ABSCESS ANESTHESIA/SEDATION: 1.0 mg IV Versed 50 mcg IV Fentanyl Total Moderate Sedation Time:  34 minutes The patient's level of consciousness and physiologic status were continuously monitored during the procedure by Radiology nursing. PROCEDURE: The procedure, risks, benefits, and alternatives were explained to the patient's wife. Questions regarding the procedure were encouraged and answered. The patient's wife understands and consents to the procedure. A time out was performed prior to initiating the procedure. The left lower abdominal wall was prepped with chlorhexidine in a sterile fashion, and a sterile drape was applied covering the operative field. A sterile gown and sterile gloves were used for the procedure. Local anesthesia was provided with 1% Lidocaine. Under CT guidance, an 18 gauge trocar needle was advanced into a left lower quadrant peritoneal fluid collection. After return of fluid, a sample was collected for culture analysis. A guidewire was then advanced through the needle. The tract was dilated over the wire. A 12 French percutaneous drainage catheter was then advanced. Catheter positioning was confirmed by CT. The catheter was connected to a suction bulb. It was secured at skin with a Prolene retention suture and StatLock device. COMPLICATIONS: None FINDINGS: Aspiration at the level of left lower quadrant fluid collection yielded bloody and slightly turbid fluid. This region of fluid appears to communicate with the majority of intraabdominal fluid seen by the most recent CT  study. Decision was made to currently place a single percutaneous drainage catheter. IMPRESSION: CT-guided percutaneous drainage of left lower quadrant peritoneal abscess with placement of 12 French percutaneous drainage catheter. The catheter was attached to suction bulb drainage. A fluid sample was sent for culture analysis. Electronically Signed   By: Irish Lack M.D.   On: 02/14/2016 11:33    Labs:  CBC:  Recent Labs  02/12/16 0346 02/13/16 0330 02/15/16 0346 02/16/16 0421  WBC 14.6* 15.5* 11.8* 13.2*  HGB 8.0* 7.7* 8.0* 8.2*  HCT 24.6* 23.6* 24.0* 24.9*  PLT 561* 557* 648* 706*    COAGS:  Recent Labs  12/04/15 2020 12/11/15 0405 12/13/15 1402 02/13/16 1140  INR 1.62 1.25 1.26 1.18  APTT 39*  --  38*  --     BMP:  Recent Labs  02/12/16 0346 02/13/16 0330 02/15/16 0346 02/16/16 0421  NA 133* 134* 134* 134*  K 3.9 3.8 3.8 3.6  CL 103 103 104 104  CO2 24 25 26 25   GLUCOSE 111* 112* 126* 121*  BUN 26* 25* 25* 24*  CALCIUM 8.2* 8.0* 8.2* 8.3*  CREATININE 0.77 0.68 0.84 0.63  GFRNONAA >60 >60 >60 >60  GFRAA >60 >60 >  60 >60    LIVER FUNCTION TESTS:  Recent Labs  02/08/16 0425 02/11/16 0323 02/13/16 0330 02/15/16 0346  BILITOT 0.4 0.7 0.3 0.3  AST 21 28 30 25   ALT 23 23 32 27  ALKPHOS 114 280* 257* 287*  PROT 4.8* 5.1* 5.4* 5.6*  ALBUMIN 1.3* 1.2* 1.2* 1.3*    Assessment and Plan: 1. S/p ex lap with SBR and now with EC fistula and LLQ abscess, s/p perc drain on 12/31 -drain in place and draining well.  Draining enteric contents. -cont routine drain care and irrigations -will follow  Electronically Signed: Anastasia Tompson E 02/16/2016, 9:23 AM   I spent a total of 15 Minutes at the the patient's bedside AND on the patient's hospital floor or unit, greater than 50% of which was counseling/coordinating care for LLQ abscess

## 2016-02-16 NOTE — Progress Notes (Signed)
PHARMACY - ADULT TOTAL PARENTERAL NUTRITION CONSULT NOTE   Pharmacy Consult for TPN Indication: PTA TPN continuation; bowel perforation  Insulin Requirements:  SSI:  1 units Novolog sensitive SSI in past 24 hours TPN: 10 units regular insulin per day in TPN   Current Nutrition:  - NPO - Clinmix-E 5/20 at 83 mL/hr, Fat Emulsion 20% at 10 mL/hr  IVF: off  Central access: PICC TPN start date: PTA, restarted on admission on 12/6    Recent Labs  02/15/16 0346 02/16/16 0421  NA 134* 134*  K 3.8 3.6  CL 104 104  CO2 26 25  GLUCOSE 126* 121*  BUN 25* 24*  CREATININE 0.84 0.63  CALCIUM 8.2* 8.3*  PHOS 3.9 4.2  MG 1.8 1.7  ALBUMIN 1.3*  --   ALKPHOS 287*  --   AST 25  --   ALT 27  --   BILITOT 0.3  --   TRIG 145  --   PREALBUMIN 10.0*  --   Corr Ca: 10.2 on 12/30  ASSESSMENT                                                                                                          HPI: 19 yoM with hx PUD, CAD, GERD, BPH,  s/p multiple procedures  and extended hospitalization for recurrent partial GOO 2/2 pyloric stricture on TPN for several days, discharged 11/15 with J/G tube in place on TFs, unable to tolerate and had to transition back to TPN via [facility] on 11/22, readmitted 12/6 with partial SBO. Pharmacy consulted to resume TPN. Note: PTA TNA from [place] was as follows: Totals per day: 405 grams (1376 kcal)  of dextrose, 75 grams (300 kcal) of protein and 50 grams ( 480 kcal) of lipid for a total of 2156 kcal/day   Significant events:  12/6: TPN continued on admission.  12/7: Pt pulled out NGT, not replaced, remains NPO 12/11: Awaiting MBS, if no aspiration, will start to advance diet.  Having bowel function.  Continue TPN for now.   12/12: Severe aspiration risk per MBS yesterday - recommend continuing NPO. Osmolite 1.2 CAL via TF BID ordered 12/11 at 6pm. Per discussion with CCS PA, continue TPN at current goal rate today, wean tomorrow if tolerates TF. 12/13:  Did not tolerate bolus feeds through G-tube; overnight emesis and aspiration event, started on Zosyn. CCS trying trickle feeds today. 12/14 transition to cyclic TPN - appears recovery may be prolonged. Not tolerating trickle feeds; G-tube to drainage.  12/15: TPN was not run at cyclic rate overnight d/t conflicting information in TPN order. To provide patient full needed nutrition, will continue to run TPN at 83 ml/hr over 24 hr and begin 18-hr cycle with tonight's TPN 12/16: start 12h TPN cycle 12/18: resume trickle tube feed at 5 ml/hr 12/19: Nausea/vomiting, tube feed held. TPN formula changed to E 5/20 from E 5/15 12/21: successful transition of G tube to [place] tube. Plan scan for patency before resuming tube feeds.  12/22: Upper GI scan in IR today to ensure  J-tube working.  12/23: exp lap, lysis of adhesions, segmental small bowel resection, gastrojejunostomy 12/25: TPN converted from cyclic back to continuous due to hypoglycemic event following end of TPN cycle and patient likely to remain hospitalized for at least several more days.  Prealbumin low, falling - Fat Emulsion increased to 10 mL/hr.  Clinimix already at 2L/day. 12/28: to start trickle feeds thru J-tube; G-tube remains on drainage 12/29: TF increased from 10 ml/hr to 20 ml/hr  12/29 PM: TF was found draining from midline incision, CT abdomen c/w bowel perforation at G-J suture site; interval development of abscesses .  TF was stopped and is to remain off until MD orders otherwise. 12/31: Plan percutaneous drainage of largest LLQ fluid collection today in IR.  Because of construction at [facility], procedure will be performed at [facility] and patient will be returned to [facility] post-procedure.  1/2: Drain output 345 ml past 24 hr, changing abd dressing ~ every 4 hr.   Today, 02/16/2016:   Glucose within goal range (100-150). No hx DM.[name]   Electrolytes (12/30): Na slightly low, others WNL  Renal (12/30) - SCr WNL, BUN elevated but decreasing.    LFTs  (12/30):  Alk phos elevated (~2x ULN).  All others < ULN.  TGs (12/24): WNL  Prealbumin:  15.3 ( 11/13), 10.8 (12/7), 12.8 (12/11), 13.1 (12/18), 8.9 (12/25) - falling  NUTRITIONAL GOALS                                                                                             RD recs (12/26): Kcal 2000-2200, Protein 100-115g, Fluid 2L/day [facility] TPN: 2156 Kcal/day, Protein 75g/day  Clinimix E 5/20 at a goal rate of 83 ml/hr + 20% fat emulsion at 10 ml/hr to provide: 100 g/day protein, 2240 Kcal/day. (+++There is currently a Psychologist, prison and probation services of Clinimix solution.  Pharmacy will use Clinimix product based on what we have available in stock+++)  PLAN                                                                                   At 1800 today:  Continue Clinimix E 5/20 at 83 ml/hr.  Continue 20% fat emulsion at 10 ml/hr.  Continue 10 units regular insulin per day in TPN.   TPN to contain standard multivitamins and trace elements.  Enteral feeds to remain off due to bowel leak  Continue sensitive SSI and CBG checks q6h.   TPN lab panels on Mondays & Thursdays  Follow clinical course daily.  Minda Ditto PharmD Pager [phone] 02/16/2016, 11:05 AM

## 2016-02-16 NOTE — Progress Notes (Signed)
10 Days Post-Op  Subjective: He looks weak and frail. He has pain with any manipulation of the dressing, use of Q tip in the open wound.  He has feculent material in the midline wound. The gastrotomy tube was a bit twisted, and we have addressed that.  He is getting OOB some and PT is working with him.  He has a PCA that helps with the pain.    Objective: Vital signs in last 24 hours: Temp:  [97.5 F (36.4 C)-97.9 F (36.6 C)] 97.8 F (36.6 C) (01/02 1610) Pulse Rate:  [73-92] 84 (01/02 0613) Resp:  [13-20] 14 (01/02 0803) BP: (119-139)/(42-68) 133/56 (01/02 0613) SpO2:  [93 %-98 %] 96 % (01/02 0803) FiO2 (%):  [28 %] 28 % (01/01 1220) Weight:  [80.4 kg (177 lb 4 oz)] 80.4 kg (177 lb 4 oz) (01/02 9604) Last BM Date: 02/08/16 NPO 3000 IV fluids Drain 515 Afebrile, VSS WBC up, H/H is stable Intake/Output from previous day: 01/01 0701 - 01/02 0700 In: 3072.4 [I.V.:2312.4; IV Piggyback:760] Out: 2515 [Urine:2000; Drains:515] Intake/Output this shift: No intake/output data recorded.  General appearance: alert, cooperative, no distress and soft, tender to touch and any manipulation of the abdomen, wound , or drains.  IR drain is feculent, Midline has feculent appearing exudate in it also.  See picture below. Resp: alert, cooperative, no distress and frail, and tired. GI: see above       Lab Results:   Recent Labs  02/15/16 0346 02/16/16 0421  WBC 11.8* 13.2*  HGB 8.0* 8.2*  HCT 24.0* 24.9*  PLT 648* 706*    BMET  Recent Labs  02/15/16 0346 02/16/16 0421  NA 134* 134*  K 3.8 3.6  CL 104 104  CO2 26 25  GLUCOSE 126* 121*  BUN 25* 24*  CREATININE 0.84 0.63  CALCIUM 8.2* 8.3*   PT/INR  Recent Labs  02/13/16 1140  LABPROT 15.0  INR 1.18     Recent Labs Lab 02/11/16 0323 02/13/16 0330 02/15/16 0346  AST 28 30 25   ALT 23 32 27  ALKPHOS 280* 257* 287*  BILITOT 0.7 0.3 0.3  PROT 5.1* 5.4* 5.6*  ALBUMIN 1.2* 1.2* 1.3*     Lipase     Component  Value Date/Time   LIPASE 55 (H) 01/20/2016 0158     Studies/Results: Ct Image Guided Drainage By Percutaneous Catheter  Result Date: 02/14/2016 CLINICAL DATA:  Intra-abdominal peritoneal abscess after prior gastric surgery, laparoscopic gastrostomy and small bowel resection. EXAM: CT GUIDED CATHETER DRAINAGE OF LEFT LOWER QUADRANT PERITONEAL ABSCESS ANESTHESIA/SEDATION: 1.0 mg IV Versed 50 mcg IV Fentanyl Total Moderate Sedation Time:  34 minutes The patient's level of consciousness and physiologic status were continuously monitored during the procedure by Radiology nursing. PROCEDURE: The procedure, risks, benefits, and alternatives were explained to the patient's wife. Questions regarding the procedure were encouraged and answered. The patient's wife understands and consents to the procedure. A time out was performed prior to initiating the procedure. The left lower abdominal wall was prepped with chlorhexidine in a sterile fashion, and a sterile drape was applied covering the operative field. A sterile gown and sterile gloves were used for the procedure. Local anesthesia was provided with 1% Lidocaine. Under CT guidance, an 18 gauge trocar needle was advanced into a left lower quadrant peritoneal fluid collection. After return of fluid, a sample was collected for culture analysis. A guidewire was then advanced through the needle. The tract was dilated over the wire. A 12 French percutaneous  drainage catheter was then advanced. Catheter positioning was confirmed by CT. The catheter was connected to a suction bulb. It was secured at skin with a Prolene retention suture and StatLock device. COMPLICATIONS: None FINDINGS: Aspiration at the level of left lower quadrant fluid collection yielded bloody and slightly turbid fluid. This region of fluid appears to communicate with the majority of intraabdominal fluid seen by the most recent CT study. Decision was made to currently place a single percutaneous drainage  catheter. IMPRESSION: CT-guided percutaneous drainage of left lower quadrant peritoneal abscess with placement of 12 French percutaneous drainage catheter. The catheter was attached to suction bulb drainage. A fluid sample was sent for culture analysis. Electronically Signed   By: Irish LackGlenn  Yamagata M.D.   On: 02/14/2016 11:33    Medications: . sodium chloride   Intravenous Once  . alteplase  2 mg Intracatheter Once  . chlorhexidine  15 mL Mouth Rinse BID  . cycloSPORINE  1 drop Both Eyes BID  . enoxaparin (LOVENOX) injection  40 mg Subcutaneous Q24H  . insulin aspart  0-9 Units Subcutaneous Q6H  . lip balm  1 application Topical BID  . liver oil-zinc oxide   Topical BID  . mouth rinse  15 mL Mouth Rinse q12n4p  . morphine   Intravenous Q4H  . nicotine  7 mg Transdermal Daily  . pantoprazole (PROTONIX) IV  40 mg Intravenous Daily  . piperacillin-tazobactam (ZOSYN)  IV  3.375 g Intravenous Q8H  . vancomycin  1,500 mg Intravenous Q24H   . [name].TPN (CLINIMIX-E) Adult 83 mL/hr at 02/15/16 1747   And  . fat emulsion 240 mL (02/15/16 1748)    Specimen Description ABSCESS INTRA ABDOMINAL  02/14/16  Special Requests Normal   Gram Stain ABUNDANT WBC PRESENT, PREDOMINANTLY PMN RARE GRAM NEGATIVE RODS FEW GRAM POSITIVE COCCI IN PAIRS   Culture MODERATE GRAM NEGATIVE RODS   Report Status PENDING    S/P Robotic anterior and posterior vagotomy, BI anastomosis, DOR fundoplication, omentopexy for partial GOO 09/2015, DR. Viviann SpareSTEVEN GROSS  Feeding Jejunostomy and gastrostomy/EGD balloon dilatation, secondary to edema at anastomosis, 12/01/15, DR. Karie SodaSteven Gross S/p diagnostic laparoscopy, omentopexy of jejunal disruption, and washout for jejunal disruption (pt pulled out jejunostomy tube)and gastric leak. 12/03/15, Dr. Karie SodaSteven Gross (pt pulled out jejunostomy tube)  Assessment/Plan PSBO with 2 days of nausea and vomiting on admit 01/20/16 prior procedures above S/p EXPLORATORY LAPAROTOMY, LYSIS OF ADHESIONS,  SEGMENTAL SMALL BOWEL RESECTION, GASTROJEJUNOSTOMY , 02/06/16, Dr. Chevis PrettyPaul Toth G tube dislodged 02/10/16, bed tansfer New EC fistula midline 02/13/16/CT shows LLQ intraabdominal abscess  CT guided LLQ abdominal abscess drainage, 02/14/16; IR Dr. Theo DillsYamagata Post op Ventricular tachycardia Tracheostomy Aspiration pneumonia/pleural effusion Malnutrition/deconditioning Anemia  Stage II sacral decubitus Malnutrition formerly on TF/now on TNA FEN:  NPO/TNA  ID:  Zosyn 02/06/16 =>> day Vancomycin 02/08/16 =>> day 8 DVT: Heparin/SCD    Plan:  NPO, we have ask Wound care nurse to see and help with the drainage.  He is getting TNA and PT.  Gm neg rods on Culture will check on need to continue Vancomycin.         LOS: 27 days    Ashleymarie Granderson 02/16/2016 [phone]

## 2016-02-16 NOTE — Progress Notes (Signed)
OT Cancellation Note  Patient Details Name: Ardyth GalRalph T Louison MRN: [id number] DOB: 24-Feb-1940   Cancelled Treatment:    Reason Eval/Treat Not Completed: Fatigue/lethargy limiting ability to participate  Daesha Insco 02/16/2016, 3:00 PM  Marica OtterMaryellen Demond Shallenberger, OTR/L [phone] 02/16/2016

## 2016-02-16 NOTE — Progress Notes (Signed)
Physical Therapy Treatment Patient Details Name: Danny GalRalph T Ciszek MRN: [id number] DOB: Jul 10, 1940 Today's Date: 02/16/2016    History of Present Illness [facility].Dx N/V r/o ileus. S/P 02/06/16 forEXPLORATORY LAPAROTOMY, LYSIS OF ADHESIONS, SEGMENTAL SMALL BOWEL RESECTION, GASTROJEJUNOSTOMY 12/23    PT Comments    Assisted OOB to amb in hallway twice.  Used B platform EVA walker for increased support and to increase amb distance.  Demonstrates less drainage from incision today vs yesterday.  Pain is also more controlled today.     Follow Up Recommendations  SNF;Supervision/Assistance - 24 hour;Home health PT     Equipment Recommendations       Recommendations for Other Services       Precautions / Restrictions Precautions Precautions: Fall Precaution Comments: recent LAP with mod ABD drainage, 2 drains, 1 bulb     TRACH collar  PCA Restrictions Weight Bearing Restrictions: No    Mobility  Bed Mobility Overal bed mobility: Needs Assistance Bed Mobility: Supine to Sit     Supine to sit: Mod assist;Max assist     General bed mobility comments: more able to self rise with less ABD pain today    requires assist for upper body due to recent ABD surgery  Transfers Overall transfer level: Needs assistance Equipment used: Rolling walker (2 wheeled) Transfers: Sit to/from [employer]GI CorporationStand;Stand Pivot Transfers Sit to Stand: Min assist;+2 safety/equipment;+2 physical assistance         General transfer comment: off elevated bed 1/4 turn to recliner + 2 assist for safety  and 50% VC's for hand placement  Ambulation/Gait Ambulation/Gait assistance: Min assist;+2 safety/equipment Ambulation Distance (Feet): 58 Feet (29 feet x 2 one sitting rest break) Assistive device: Bilateral platform walker (EVA walker) Gait Pattern/deviations: Step-to pattern Gait velocity: decreased   General Gait Details: decreased amb distance due to recent surgery and stay in ICU.   Requires + 3 assist such that recliner is following.     Stairs            Wheelchair Mobility    Modified Rankin (Stroke Patients Only)       Balance                                    Cognition Arousal/Alertness: Awake/alert Behavior During Therapy: WFL for tasks assessed/performed Overall Cognitive Status: Within Functional Limits for tasks assessed                 General Comments: needs MAX encouragement.  Pt quick to decline OOB activity    Exercises      General Comments        Pertinent Vitals/Pain      Home Living                      Prior Function            PT Goals (current goals can now be found in the care plan section) Progress towards PT goals: Progressing toward goals    Frequency    Min 3X/week      PT Plan Current plan remains appropriate    Co-evaluation             End of Session Equipment Utilized During Treatment: Gait belt Activity Tolerance: Patient limited by pain Patient left: in chair;with family/visitor present;with call bell/phone within reach     Time: 1115-1145 PT Time Calculation (min) (  ACUTE ONLY): 30 min  Charges:  $Gait Training: 8-22 mins $Therapeutic Activity: 8-22 mins                    G Codes:      Rica Koyanagi  PTA [facility]  Acute  Rehab Pager      [phone]

## 2016-02-17 LAB — GLUCOSE, CAPILLARY
GLUCOSE-CAPILLARY: 131 mg/dL — AB (ref 65–99)
Glucose-Capillary: 113 mg/dL — ABNORMAL HIGH (ref 65–99)
Glucose-Capillary: 113 mg/dL — ABNORMAL HIGH (ref 65–99)
Glucose-Capillary: 119 mg/dL — ABNORMAL HIGH (ref 65–99)

## 2016-02-17 LAB — CBC
HCT: 26.1 % — ABNORMAL LOW (ref 39.0–52.0)
HEMOGLOBIN: 8.5 g/dL — AB (ref 13.0–17.0)
MCH: 29.3 pg (ref 26.0–34.0)
MCHC: 32.6 g/dL (ref 30.0–36.0)
MCV: 90 fL (ref 78.0–100.0)
Platelets: 830 10*3/uL — ABNORMAL HIGH (ref 150–400)
RBC: 2.9 MIL/uL — AB (ref 4.22–5.81)
RDW: 15.8 % — ABNORMAL HIGH (ref 11.5–15.5)
WBC: 14.5 10*3/uL — AB (ref 4.0–10.5)

## 2016-02-17 MED ORDER — TRACE MINERALS CR-CU-MN-SE-ZN 10-1000-500-60 MCG/ML IV SOLN
INTRAVENOUS | Status: AC
  Administered 2016-02-17: 17:00:00 via INTRAVENOUS
  Filled 2016-02-17: qty 1992

## 2016-02-17 MED ORDER — DEXTROSE 5 % IV SOLN
2.0000 g | INTRAVENOUS | Status: DC
  Administered 2016-02-17 – 2016-02-25 (×9): 2 g via INTRAVENOUS
  Filled 2016-02-17 (×10): qty 2

## 2016-02-17 MED ORDER — FAT EMULSION 20 % IV EMUL
240.0000 mL | INTRAVENOUS | Status: AC
  Administered 2016-02-17: 240 mL via INTRAVENOUS
  Filled 2016-02-17: qty 240

## 2016-02-17 NOTE — Progress Notes (Signed)
11 Days Post-Op  Subjective: Pt ambulating some.    Objective: Vital signs in last 24 hours: Temp:  [97.7 F (36.5 C)-99.4 F (37.4 C)] 98 F (36.7 C) (01/03 0504) Pulse Rate:  [80-100] 88 (01/03 0828) Resp:  [14-20] 20 (01/03 1126) BP: (126-139)/(50-55) 131/51 (01/03 0504) SpO2:  [94 %-98 %] 98 % (01/03 1126) FiO2 (%):  [28 %] 28 % (01/03 1126) Weight:  [78.8 kg (173 lb 11.6 oz)] 78.8 kg (173 lb 11.6 oz) (01/03 0504) Last BM Date: 02/08/16  Intake/Output from previous day: 01/02 0701 - 01/03 0700 In: 2079 [I.V.:1874; IV Piggyback:205] Out: 2540 [Urine:1875; Drains:515; Stool:150] Intake/Output this shift: Total I/O In: -  Out: 75 [Stool:75]  General appearance: alert, cooperative, no distress and soft, tender to touch and any manipulation of the abdomen, wound , or drains.  IR drain is feculent, Midline has Eakins pouch.  Putting out minimal bilious secretions.  Resp: alert, cooperative, no distress and frail, and tired. GI: see above       Lab Results:   Recent Labs  02/16/16 0421 02/17/16 0444  WBC 13.2* 14.5*  HGB 8.2* 8.5*  HCT 24.9* 26.1*  PLT 706* 830*    BMET  Recent Labs  02/15/16 0346 02/16/16 0421  NA 134* 134*  K 3.8 3.6  CL 104 104  CO2 26 25  GLUCOSE 126* 121*  BUN 25* 24*  CREATININE 0.84 0.63  CALCIUM 8.2* 8.3*   PT/INR No results for input(s): LABPROT, INR in the last 72 hours.   Recent Labs Lab 02/11/16 0323 02/13/16 0330 02/15/16 0346  AST 28 30 25   ALT 23 32 27  ALKPHOS 280* 257* 287*  BILITOT 0.7 0.3 0.3  PROT 5.1* 5.4* 5.6*  ALBUMIN 1.2* 1.2* 1.3*     Lipase     Component Value Date/Time   LIPASE 55 (H) 01/20/2016 0158     Studies/Results: No results found.  Medications: . sodium chloride   Intravenous Once  . alteplase  2 mg Intracatheter Once  . chlorhexidine  15 mL Mouth Rinse BID  . cycloSPORINE  1 drop Both Eyes BID  . enoxaparin (LOVENOX) injection  40 mg Subcutaneous Q24H  . insulin aspart  0-9  Units Subcutaneous Q6H  . lip balm  1 application Topical BID  . liver oil-zinc oxide   Topical BID  . mouth rinse  15 mL Mouth Rinse q12n4p  . morphine   Intravenous Q4H  . nicotine  7 mg Transdermal Daily  . pantoprazole (PROTONIX) IV  40 mg Intravenous Daily  . piperacillin-tazobactam (ZOSYN)  IV  3.375 g Intravenous Q8H   . [name] KitchenTPN (CLINIMIX-E) Adult 83 mL/hr at 02/16/16 1751   And  . fat emulsion 240 mL (02/16/16 1751)  . [name] KitchenTPN (CLINIMIX-E) Adult     And  . fat emulsion      Specimen Description ABSCESS INTRA ABDOMINAL  02/14/16  Special Requests Normal   Gram Stain ABUNDANT WBC PRESENT, PREDOMINANTLY PMN RARE GRAM NEGATIVE RODS FEW GRAM POSITIVE COCCI IN PAIRS   Culture MODERATE GRAM NEGATIVE RODS   Report Status PENDING    S/P Robotic anterior and posterior vagotomy, BI anastomosis, DOR fundoplication, omentopexy for partial GOO 09/2015, DR. Viviann Spare GROSS  Feeding Jejunostomy and gastrostomy/EGD balloon dilatation, secondary to edema at anastomosis, 12/01/15, DR. Karie Soda S/p diagnostic laparoscopy, omentopexy of jejunal disruption, and washout for jejunal disruption (pt pulled out jejunostomy tube)and gastric leak. 12/03/15, Dr. Karie Soda (pt pulled out jejunostomy tube)  Assessment/Plan PSBO with  2 days of nausea and vomiting on admit 01/20/16 prior procedures above S/p EXPLORATORY LAPAROTOMY, LYSIS OF ADHESIONS, SEGMENTAL SMALL BOWEL RESECTION, GASTROJEJUNOSTOMY , 02/06/16, Dr. Chevis PrettyPaul Toth G tube dislodged 02/10/16, bed tansfer New EC fistula midline 02/13/16/CT shows LLQ intraabdominal abscess  CT guided LLQ abdominal abscess drainage, 02/14/16; IR Dr. Fredia SorrowYamagata Post op Ventricular tachycardia Tracheostomy Aspiration pneumonia/pleural effusion Malnutrition/deconditioning Anemia  Stage II sacral decubitus Malnutrition formerly on TF/now on TNA FEN:  NPO/TNA  ID:  Zosyn 02/06/16 =>> day , Vancomycin 02/08/16 =>> stopped 02/16/16  DVT: Heparin/SCD    Plan:  NPO.   Cont TPN.  WBC trending up.  May need repeat scans if this continues to trend up.        LOS: 28 days    [name] C. 02/17/2016

## 2016-02-17 NOTE — Progress Notes (Signed)
11 Days Post-Op  Subjective: No real change, large Eakin's in place  Objective: Vital signs in last 24 hours: Temp:  [97.7 F (36.5 C)-99.4 F (37.4 C)] 98 F (36.7 C) (01/03 0504) Pulse Rate:  [80-100] 88 (01/03 0828) Resp:  [14-18] 17 (01/03 0828) BP: (126-139)/(49-55) 131/51 (01/03 0504) SpO2:  [92 %-98 %] 98 % (01/03 0828) FiO2 (%):  [28 %] 28 % (01/03 0828) Weight:  [78.8 kg (173 lb 11.6 oz)] 78.8 kg (173 lb 11.6 oz) (01/03 0504) Last BM Date: 02/08/16  2079 IV 1875 urine Drain 515 Stool 150 Afebrile, VSS WBC 14.5 Platelets 830 K and rising Intake/Output from previous day: 01/02 0701 - 01/03 0700 In: 2079 [I.V.:1874; IV Piggyback:205] Out: 2540 [Urine:1875; Drains:515; Stool:150] Intake/Output this shift: Total I/O In: -  Out: 75 [Stool:75]  General appearance: alert, cooperative and no distress Resp: clear to auscultation bilaterally GI: soft sore, Eakins working well.  Lab Results:   Recent Labs  02/16/16 0421 02/17/16 0444  WBC 13.2* 14.5*  HGB 8.2* 8.5*  HCT 24.9* 26.1*  PLT 706* 830*    BMET  Recent Labs  02/15/16 0346 02/16/16 0421  NA 134* 134*  K 3.8 3.6  CL 104 104  CO2 26 25  GLUCOSE 126* 121*  BUN 25* 24*  CREATININE 0.84 0.63  CALCIUM 8.2* 8.3*   PT/INR No results for input(s): LABPROT, INR in the last 72 hours.   Recent Labs Lab 02/11/16 0323 02/13/16 0330 02/15/16 0346  AST 28 30 25   ALT 23 32 27  ALKPHOS 280* 257* 287*  BILITOT 0.7 0.3 0.3  PROT 5.1* 5.4* 5.6*  ALBUMIN 1.2* 1.2* 1.3*     Lipase     Component Value Date/Time   LIPASE 55 (H) 01/20/2016 0158     Studies/Results: No results found.  Medications: . sodium chloride   Intravenous Once  . alteplase  2 mg Intracatheter Once  . chlorhexidine  15 mL Mouth Rinse BID  . cycloSPORINE  1 drop Both Eyes BID  . enoxaparin (LOVENOX) injection  40 mg Subcutaneous Q24H  . insulin aspart  0-9 Units Subcutaneous Q6H  . lip balm  1 application Topical BID   . liver oil-zinc oxide   Topical BID  . mouth rinse  15 mL Mouth Rinse q12n4p  . morphine   Intravenous Q4H  . nicotine  7 mg Transdermal Daily  . pantoprazole (PROTONIX) IV  40 mg Intravenous Daily  . piperacillin-tazobactam (ZOSYN)  IV  3.375 g Intravenous Q8H   . [name].TPN (CLINIMIX-E) Adult 83 mL/hr at 02/16/16 1751   And  . fat emulsion 240 mL (02/16/16 1751)   Specimen Description ABSCESS INTRA ABDOMINAL   Special Requests Normal   Gram Stain ABUNDANT WBC PRESENT, PREDOMINANTLY PMN RARE GRAM NEGATIVE RODS FEW GRAM POSITIVE COCCI IN PAIRS   Culture MODERATE KLEBSIELLA PNEUMONIAE MODERATE ESCHERICHIA COLI NO ANAEROBES ISOLATED; CULTURE IN PROGRESS FOR 5 DAYS   Report Status PENDING   Organism ID, Bacteria KLEBSIELLA PNEUMONIAE   Organism ID, Bacteria ESCHERICHIA COLI   Resulting Agency [facility]  Susceptibility    Klebsiella pneumoniae Escherichia coli    MIC MIC    AMPICILLIN >=32 RESIST... Resistant >=32 RESIST... Resistant    AMPICILLIN/SULBACTAM >=32 RESIST... Resistant >=32 RESIST... Resistant    CEFAZOLIN >=64 RESIST... Resistant <=4 SENSITIVE "><=4 SENSITIVE  Sensitive    CEFEPIME <=1 SENSITIVE "><=1 SENSITIVE  Sensitive <=1 SENSITIVE "><=1 SENSITIVE  Sensitive    CEFTAZIDIME 4 SENSITIVE  Sensitive <=1 SENSITIVE "><=1  SENSITIVE  Sensitive    CEFTRIAXONE <=1 SENSITIVE "><=1 SENSITIVE  Sensitive <=1 SENSITIVE "><=1 SENSITIVE  Sensitive    CIPROFLOXACIN <=0.25 SENSITIVE "><=0.25 SENS... Sensitive <=0.25 SENSITIVE "><=0.25 SENS... Sensitive    Extended ESBL NEGATIVE  Sensitive NEGATIVE  Sensitive    GENTAMICIN <=1 SENSITIVE "><=1 SENSITIVE  Sensitive <=1 SENSITIVE "><=1 SENSITIVE  Sensitive    IMIPENEM <=0.25 SENSITIVE "><=0.25 SENS... Sensitive <=0.25 SENSITIVE "><=0.25 SENS... Sensitive    PIP/TAZO >=128 RESIS... Resistant <=4 SENSITIVE "><=4 SENSITIVE  Sensitive    TRIMETH/SULFA <=20 SENSITIVE "><=20 SENSIT... Sensitive <=20 SENSITIVE "><=20 SENSIT... Sensitive            Susceptibility Comments   Klebsiella pneumoniae  MODERATE KLEBSIELLA PNEUMONIAE  Escherichia coli  MODERATE ESCHERICHIA COLI        S/P Robotic anterior and posterior vagotomy, BI anastomosis, DOR fundoplication, omentopexy for partial GOO 09/2015, DR. Viviann Spare GROSS  Feeding Jejunostomy and gastrostomy/EGD balloon dilatation, secondary to edema at anastomosis, 12/01/15, DR. Karie Soda S/p diagnostic laparoscopy, omentopexy of jejunal disruption, and washout for jejunal disruption (pt pulled out jejunostomy tube)and gastric leak. 12/03/15, Dr. Karie Soda (pt pulled out jejunostomy tube)  Assessment/Plan PSBO with 2 days of nausea and vomiting on readmit 01/20/16 prior procedures above S/p EXPLORATORY LAPAROTOMY, LYSIS OF ADHESIONS, SEGMENTAL SMALL BOWEL RESECTION, GASTROJEJUNOSTOMY , 02/06/16, Dr. Chevis Pretty G tube dislodged 02/10/16, bed tansfer New EC fistula midline 02/13/16/CT shows LLQ intraabdominal abscess CT guided LLQ abdominal abscess drainage, 02/14/16; IR Dr. Theo Dills op Ventricular tachycardia Tracheostomy Aspiration pneumonia/pleural effusion Malnutrition/deconditioning Anemia  Stage II sacral decubitus Malnutrition formerly on TF/now on TNA FEN: NPO/TNA  ID:  Zosyn 02/06/16 =>> day Vancomycin 02/08/16 =>> day 8 d/ced yesterday Culture today above, Klebsiella, add Rocephin and talk with Dr. Maisie Fus.   DVT: Heparin/SCD     Plan:  New culture, with Klebsiella not covered by Zosyn, add Rocephin for now.     LOS: 28 days    [name] 02/17/2016 [phone]

## 2016-02-17 NOTE — Care Management Important Message (Signed)
Important Message  Patient Details  Name: Ardyth GalRalph T Stiggers MRN: [id number] Date of Birth: [birth year]   Medicare Important Message Given:  Yes    Caren MacadamFuller, Kymberli Wiegand 02/17/2016, 11:56 AMImportant Message  Patient Details  Name: [name] MRN: [id number] Date of Birth: [birth year]   Medicare Important Message Given:  Yes    Caren MacadamFuller, Zsofia Prout 02/17/2016, 11:56 AM

## 2016-02-17 NOTE — Progress Notes (Signed)
Occupational Therapy Treatment Patient Details Name: Danny GalRalph T Rosen MRN: [id number] DOB: [birth year] Today's Date: 02/17/2016    [facility]Dx N/V r/o ileus. S/P 02/06/16 forEXPLORATORY LAPAROTOMY, LYSIS OF ADHESIONS, SEGMENTAL SMALL BOWEL RESECTION, GASTROJEJUNOSTOMY 12/23   OT comments  Pt used morphine pump prior to performing BUE. Pt did grimace in pain while performing BUE AROM- but was able to work through it           Precautions / Restrictions Precautions Precautions: Fall Restrictions Weight Bearing Restrictions: No       Mobility Bed Mobility              NT    Transfers          NT                                Cognition     Overall Cognitive Status: Within Functional Limits for tasks assessed                  General Comments: pt very agreeable to BUe exercise      Exercises General Exercises - Upper Extremity Shoulder Flexion: AROM;10 reps;Both;Other (comment);Strengthening Shoulder ABduction: AROM;Right;Left;Other (comment);Supine Elbow Flexion: AROM;Left;Both;Strengthening;Other (comment);Supine Elbow Extension: AROM;10 reps;Both;Other (comment);Supine Wrist Flexion: AROM;Both;Supine;10 reps Wrist Extension: AROM;Supine;Both;10 reps Digit Composite Flexion: AROM;Both;Supine;10 reps   Shoulder Instructions            Pertinent Vitals/ Pain       Pain Assessment: Faces Pain Score: 4  Faces Pain Scale: Hurts little more Pain Location: ABD Pain Descriptors / Indicators: Sore;Discomfort Pain Intervention(s): Monitored during session;Limited activity within patient's tolerance            Progress Toward Goals  OT Goals(current goals can now be found in the care plan section)  Progress towards OT goals: Progressing toward goals            End of Session  left supine in bed as eyes were becoming heavy after morphine                 Time: 1030-1056 OT  Time Calculation (min): 26 min  Charges: OT General Charges $OT Visit: 1 Procedure OT Treatments $Therapeutic Exercise: 23-37 mins  Zaiden Ludlum, Karin GoldenLorraine D 02/17/2016, 12:03 PM

## 2016-02-17 NOTE — Progress Notes (Addendum)
PHARMACY - ADULT TOTAL PARENTERAL NUTRITION CONSULT NOTE   Pharmacy Consult for TPN Indication: PTA TPN continuation; bowel perforation  Insulin Requirements:  SSI:  zero units Novolog sensitive SSI in past 24 hours TPN: 10 units regular insulin per day in TPN   Current Nutrition:  - NPO - Clinmix-E 5/20 at 83 mL/hr, Fat Emulsion 20% at 10 mL/hr  IVF: off  Central access: PICC TPN start date: PTA, restarted on admission on 12/6    Recent Labs  02/15/16 0346 02/16/16 0421  NA 134* 134*  K 3.8 3.6  CL 104 104  CO2 26 25  GLUCOSE 126* 121*  BUN 25* 24*  CREATININE 0.84 0.63  CALCIUM 8.2* 8.3*  PHOS 3.9 4.2  MG 1.8 1.7  ALBUMIN 1.3*  --   ALKPHOS 287*  --   AST 25  --   ALT 27  --   BILITOT 0.3  --   TRIG 145  --   PREALBUMIN 10.0*  --   Corr Ca: 10.2 on 12/30  ASSESSMENT                                                                                                          HPI: 89 yoM with hx PUD, CAD, GERD, BPH,  s/p multiple procedures  and extended hospitalization for recurrent partial GOO 2/2 pyloric stricture on TPN for several days, discharged 11/15 with J/G tube in place on TFs, unable to tolerate and had to transition back to TPN via [facility] on 11/22, readmitted 12/6 with partial SBO. Pharmacy consulted to resume TPN. Note: PTA TNA from [place] was as follows: Totals per day: 405 grams (1376 kcal)  of dextrose, 75 grams (300 kcal) of protein and 50 grams ( 480 kcal) of lipid for a total of 2156 kcal/day   Significant events:  12/6: TPN continued on admission.  12/7: Pt pulled out NGT, not replaced, remains NPO 12/11: Awaiting MBS, if no aspiration, will start to advance diet.  Having bowel function.  Continue TPN for now.   12/12: Severe aspiration risk per MBS yesterday - recommend continuing NPO. Osmolite 1.2 CAL via TF BID ordered 12/11 at 6pm. Per discussion with CCS PA, continue TPN at current goal rate today, wean tomorrow if tolerates TF. 12/13:  Did not tolerate bolus feeds through G-tube; overnight emesis and aspiration event, started on Zosyn. CCS trying trickle feeds today. 12/14 transition to cyclic TPN - appears recovery may be prolonged. Not tolerating trickle feeds; G-tube to drainage.  12/15: TPN was not run at cyclic rate overnight d/t conflicting information in TPN order. To provide patient full needed nutrition, will continue to run TPN at 83 ml/hr over 24 hr and begin 18-hr cycle with tonight's TPN 12/16: start 12h TPN cycle 12/18: resume trickle tube feed at 5 ml/hr 12/19: Nausea/vomiting, tube feed held. TPN formula changed to E 5/20 from E 5/15 12/21: successful transition of G tube to [place] tube. Plan scan for patency before resuming tube feeds.  12/22: Upper GI scan in IR today to ensure  J-tube working.  12/23: exp lap, lysis of adhesions, segmental small bowel resection, gastrojejunostomy 12/25: TPN converted from cyclic back to continuous due to hypoglycemic event following end of TPN cycle and patient likely to remain hospitalized for at least several more days.  Prealbumin low, falling - Fat Emulsion increased to 10 mL/hr.  Clinimix already at 2L/day. 12/28: to start trickle feeds thru J-tube; G-tube remains on drainage 12/29: TF increased from 10 ml/hr to 20 ml/hr  12/29 PM: TF was found draining from midline incision, CT abdomen c/w bowel perforation at G-J suture site; interval development of abscesses .  TF was stopped and is to remain off until MD orders otherwise. 12/31: Plan percutaneous drainage of largest LLQ fluid collection today in IR.  Because of construction at [facility], procedure will be performed at [facility] and patient will be returned to [facility] post-procedure.  1/2: Drain output 345 ml past 24 hr, changing abd dressing ~ every 4 hr. 1/3: EC Fistula - feculent material from drain placed 12/31 after perc drainage  & in midline wound.    Today, 02/17/2016:   Glucose within goal range (100-150). No hx DM.[name]   Electrolytes  (12/30): Na slightly low, others WNL  Renal (12/30) - SCr WNL, BUN elevated but decreasing.    LFTs (12/30):  Alk phos elevated (~2x ULN).  All others < ULN.  TGs (12/24): WNL  Prealbumin:  15.3 ( 11/13), 10.8 (12/7), 12.8 (12/11), 13.1 (12/18), 8.9 (12/25) - falling  Total abd drainage: 515 ml/24 hr  NUTRITIONAL GOALS                                                                                             RD recs (12/26): Kcal 2000-2200, Protein 100-115g, Fluid 2L/day [facility] TPN: 2156 Kcal/day, Protein 75g/day  Clinimix E 5/20 at a goal rate of 83 ml/hr + 20% fat emulsion at 10 ml/hr to provide: 100 g/day protein, 2240 Kcal/day. (+++There is currently a Psychologist, prison and probation services of Clinimix solution.  Pharmacy will use Clinimix product based on what we have available in stock+++)  PLAN                                                                                   At 1800 today:  Continue Clinimix E 5/20 at 83 ml/hr.  Continue 20% fat emulsion at 10 ml/hr.  Continue 10 units regular insulin per day in TPN.   TPN to contain standard multivitamins and trace elements.  Enteral feeds to remain off due to bowel leak  Continue sensitive SSI and CBG checks q6h.   TPN lab panels on Mondays & Thursdays  Follow clinical course daily.  Minda Ditto PharmD Pager [phone] 02/17/2016, 12:00 PM

## 2016-02-17 NOTE — Progress Notes (Signed)
Physical Therapy Treatment Patient Details Name: [name] MRN: [id number] DOB: 16-Jun-1940 Today's Date: 02/17/2016    History of Present Illness re admit from [facility] 12/05.Dx N/V r/o ileus. S/P 02/06/16 forEXPLORATORY LAPAROTOMY, LYSIS OF ADHESIONS, SEGMENTAL SMALL BOWEL RESECTION, GASTROJEJUNOSTOMY 12/23    PT Comments    Pt progressing well with mobility after recent LAP. Assisted OOB to amb a greater distance.    Follow Up Recommendations        Equipment Recommendations       Recommendations for Other Services       Precautions / Restrictions Precautions Precautions: Fall Precaution Comments: recent LAP with mod ABD drainage, 2 drains, 1 bulb     TRACH collar  PCA Restrictions Weight Bearing Restrictions: No    Mobility  Bed Mobility Overal bed mobility: Needs Assistance Bed Mobility: Supine to Sit     Supine to sit: Mod assist     General bed mobility comments: more able to self rise with less ABD pain today    requires assist for upper body due to recent ABD surgery  Transfers Overall transfer level: Needs assistance Equipment used: None   Sit to Stand: Min assist;+2 safety/equipment;+2 physical assistance         General transfer comment: 25% VC's on proper hand placement to increase self effort and VC's for hand placement prior to sit  Ambulation/Gait Ambulation/Gait assistance: Min assist;+2 safety/equipment Ambulation Distance (Feet): 95 Feet Assistive device: Bilateral platform walker Gait Pattern/deviations: Step-to pattern Gait velocity: decreased   General Gait Details: tolerated increased distance.  Using B platform EVA walker for increased support due to recent ABD surgery.  Third assist needed to follow with recliner as pt sits quickly due to fatigue.    Stairs            Wheelchair Mobility    Modified Rankin (Stroke Patients Only)       Balance                                    Cognition  Arousal/Alertness: Awake/alert Behavior During Therapy: WFL for tasks assessed/performed Overall Cognitive Status: Within Functional Limits for tasks assessed                 General Comments: pt very agreeable to BUe exercise    Exercises   General Comments        Pertinent Vitals/Pain Pain Assessment: Faces  Faces Pain Scale: Hurts a little bit Pain Location: ABD    used PCA x 1 Pain Descriptors / Indicators: Grimacing;Operative site guarding Pain Intervention(s): Monitored during session;Repositioned    Home Living                      Prior Function            PT Goals (current goals can now be found in the care plan section) Progress towards PT goals: Progressing toward goals    Frequency           PT Plan      Co-evaluation             End of Session    in recliner with family in room       Time: 1104-1128 PT Time Calculation (min) (ACUTE ONLY): 24 min  Charges:  $Gait Training: 8-22 mins $Therapeutic Activity: 8-22 mins  G Codes:      Rica Koyanagi  PTA [facility]  Acute  Rehab Pager      [phone]

## 2016-02-17 NOTE — Progress Notes (Signed)
Referring Physician(s): Martin,M  Supervising Physician: Richarda OverlieHenn, Adam  Patient Status:  [facility]  Chief Complaint: LLQ abdominal abscess   Subjective: Pt sitting up in chair; feels about the same; still draining fluid from midline wound   Allergies: Flomax [tamsulosin hcl]; Lisinopril; Lorazepam; and Uroxatral [alfuzosin hcl er]  Medications: Prior to Admission medications   Medication Sig Start Date End Date Taking? Authorizing Provider  aluminum-magnesium hydroxide-simethicone (MAALOX) 200-200-20 MG/5ML SUSP Take 30 mLs by mouth every 6 (six) hours as needed.   Yes Historical Provider, MD  Amino Ac Elect-Calc in D10W (CLINIMIX E/DEXTROSE, 2.75/10,) 2.75 % SOLN Inject 2,160 mLs into the vein as needed.   Yes Historical Provider, MD  aspirin 81 MG chewable tablet Place 81 mg into feeding tube every evening.   Yes Historical Provider, MD  atorvastatin (LIPITOR) 40 MG tablet Place 40 mg into feeding tube at bedtime.    Yes Historical Provider, MD  bisacodyl (DULCOLAX) 10 MG suppository Place 10 mg rectally daily as needed for moderate constipation or severe constipation.   Yes Historical Provider, MD  bismuth subsalicylate (PEPTO BISMOL) 262 MG/15ML suspension Place 30 mLs into feeding tube every 8 (eight) hours as needed for indigestion or diarrhea or loose stools.   Yes Historical Provider, MD  calcium-vitamin D (OSCAL WITH D) 500-200 MG-UNIT tablet Place 1 tablet into feeding tube daily with breakfast.    Yes Historical Provider, MD  Cyanocobalamin (VITAMIN B-12) 1000 MCG SUBL Place 1 tablet under the tongue daily with breakfast.   Yes Historical Provider, MD  Ferrous Sulfate 220 (44 Fe) MG/5ML LIQD Place 7.5 mLs into feeding tube at bedtime.    Yes Historical Provider, MD  FLUoxetine (PROZAC) 20 MG/5ML solution Place 20 mg into feeding tube daily.   Yes Historical Provider, MD  haloperidol (HALDOL) 0.5 MG tablet Take 0.25-0.5 mg by mouth 3 (three) times daily. At 6 am and 4 PM  and 9 PM   Yes Historical Provider, MD  haloperidol (HALDOL) 0.5 MG tablet Place 0.5 mg into feeding tube as needed for agitation. STOP DATE 01/24/2016 01/10/16 01/24/16 Yes Historical Provider, MD  loperamide (IMODIUM) 1 MG/5ML solution 10 mg. Take 10 mls via tube every 8 hours as needed for more than 2 bms every 8 hours.   Yes Historical Provider, MD  magic mouthwash SOLN Take 15 mLs by mouth 4 (four) times daily as needed for mouth pain (sore throat). 12/30/15  Yes Karie SodaSteven Gross, MD  metoCLOPramide (REGLAN) 5 MG/5ML solution Place 10 mg into feeding tube every 6 (six) hours as needed for nausea or vomiting.   Yes Historical Provider, MD  metoprolol tartrate (LOPRESSOR) 25 MG tablet Place 25 mg into feeding tube daily.   Yes Historical Provider, MD  nicotine (NICODERM CQ - DOSED IN MG/24 HR) 7 mg/24hr patch Place 7 mg onto the skin daily. Rotate site   Yes Historical Provider, MD  nitroGLYCERIN (NITROSTAT) 0.4 MG SL tablet Place 0.4 mg under the tongue every 5 (five) minutes as needed for chest pain.   Yes Historical Provider, MD  Nutritional Supplements (FEEDING SUPPLEMENT, OSMOLITE 1.2 CAL,) LIQD Place 237 mLs into feeding tube. Give 1 can via G-tube every day at  10 am and  6 pm.   Yes Historical Provider, MD  omeprazole (PRILOSEC) 40 MG capsule Take 40 mg by mouth 2 (two) times daily.   Yes Historical Provider, MD  Polyvinyl Alcohol-Povidone (REFRESH OP) Place 2 drops into both eyes as needed (For dry eyes.).  Yes Historical Provider, MD  potassium chloride (KCL) 2 mEq/mL SOLN oral liquid Take 30 mEq by mouth daily.   Yes Historical Provider, MD  RESTASIS 0.05 % ophthalmic emulsion Place 1 drop into both eyes 2 (two) times daily.  04/16/13  Yes Historical Provider, MD  traZODone (DESYREL) 50 MG tablet Place 100 mg into feeding tube at bedtime.    Yes Historical Provider, MD  Water For Irrigation, Sterile (STERILE WATER FOR IRRIGATION) Irrigate with 250 mLs as directed every 6 (six) hours.     Historical Provider, MD     Vital Signs: BP (!) 131/51 (BP Location: Left Arm)   Pulse 88   Temp 98 F (36.7 C) (Oral)   Resp 20   Ht 6\' 2"  (1.88 m)   Wt 173 lb 11.6 oz (78.8 kg)   SpO2 98%   BMI 22.30 kg/m   Physical Exam LLQ drain intact, insertion site ok, output 15-20 cc beige colored fluid  Imaging: Ct Image Guided Drainage By Percutaneous Catheter  Result Date: 02/14/2016 CLINICAL DATA:  Intra-abdominal peritoneal abscess after prior gastric surgery, laparoscopic gastrostomy and small bowel resection. EXAM: CT GUIDED CATHETER DRAINAGE OF LEFT LOWER QUADRANT PERITONEAL ABSCESS ANESTHESIA/SEDATION: 1.0 mg IV Versed 50 mcg IV Fentanyl Total Moderate Sedation Time:  34 minutes The patient's level of consciousness and physiologic status were continuously monitored during the procedure by Radiology nursing. PROCEDURE: The procedure, risks, benefits, and alternatives were explained to the patient's wife. Questions regarding the procedure were encouraged and answered. The patient's wife understands and consents to the procedure. A time out was performed prior to initiating the procedure. The left lower abdominal wall was prepped with chlorhexidine in a sterile fashion, and a sterile drape was applied covering the operative field. A sterile gown and sterile gloves were used for the procedure. Local anesthesia was provided with 1% Lidocaine. Under CT guidance, an 18 gauge trocar needle was advanced into a left lower quadrant peritoneal fluid collection. After return of fluid, a sample was collected for culture analysis. A guidewire was then advanced through the needle. The tract was dilated over the wire. A 12 French percutaneous drainage catheter was then advanced. Catheter positioning was confirmed by CT. The catheter was connected to a suction bulb. It was secured at skin with a Prolene retention suture and StatLock device. COMPLICATIONS: None FINDINGS: Aspiration at the level of left lower  quadrant fluid collection yielded bloody and slightly turbid fluid. This region of fluid appears to communicate with the majority of intraabdominal fluid seen by the most recent CT study. Decision was made to currently place a single percutaneous drainage catheter. IMPRESSION: CT-guided percutaneous drainage of left lower quadrant peritoneal abscess with placement of 12 French percutaneous drainage catheter. The catheter was attached to suction bulb drainage. A fluid sample was sent for culture analysis. Electronically Signed   By: Irish Lack M.D.   On: 02/14/2016 11:33    Labs:  CBC:  Recent Labs  02/13/16 0330 02/15/16 0346 02/16/16 0421 02/17/16 0444  WBC 15.5* 11.8* 13.2* 14.5*  HGB 7.7* 8.0* 8.2* 8.5*  HCT 23.6* 24.0* 24.9* 26.1*  PLT 557* 648* 706* 830*    COAGS:  Recent Labs  12/04/15 2020 12/11/15 0405 12/13/15 1402 02/13/16 1140  INR 1.62 1.25 1.26 1.18  APTT 39*  --  38*  --     BMP:  Recent Labs  02/12/16 0346 02/13/16 0330 02/15/16 0346 02/16/16 0421  NA 133* 134* 134* 134*  K 3.9 3.8 3.8 3.6  CL 103 103 104 104  CO2 24 25 26 25   GLUCOSE 111* 112* 126* 121*  BUN 26* 25* 25* 24*  CALCIUM 8.2* 8.0* 8.2* 8.3*  CREATININE 0.77 0.68 0.84 0.63  GFRNONAA >60 >60 >60 >60  GFRAA >60 >60 >60 >60    LIVER FUNCTION TESTS:  Recent Labs  02/08/16 0425 02/11/16 0323 02/13/16 0330 02/15/16 0346  BILITOT 0.4 0.7 0.3 0.3  AST 21 28 30 25   ALT 23 23 32 27  ALKPHOS 114 280* 257* 287*  PROT 4.8* 5.1* 5.4* 5.6*  ALBUMIN 1.3* 1.2* 1.2* 1.3*    Assessment and Plan: S/p ex lap with SBR and now with EC fistula and LLQ abscess, s/p perc drain on 12/31-drain in place and draining well.  Draining enteric contents.WBC up sl to 14.5(13.2); HGB 8.5(8.2), drain fluid cx's- klebsiella which is resistant to zosyn(current antbx), e coli; cont current drain care/irrigation ; may need antbx adjustment -plans as per pharm; f/u imaging if clinically worsens or within 1  week of drain placement   Electronically Signed: D. Jeananne Rama 02/17/2016, 1:40 PM   I spent a total of 15 minutes at the the patient's bedside AND on the patient's hospital floor or unit, greater than 50% of which was counseling/coordinating care for abdominal abscess drain    Patient ID: [name], male   DOB: 03-11-1940, 76 y.o.   MRN: [id number]

## 2016-02-17 NOTE — Progress Notes (Signed)
Pharmacy Antibiotic Note  Danny GalRalph T [name] is a 76 y.o. male who is s/p multiple procedures and an extended hospitalization for recurrent partial GOO, readmitted on 01/19/2016 with partial SBO and possible aspiration PNA.  Pharmacy was consulted to resume Vancomycin and Zosyn dosing on 12/25 (POD #2 from exploratory laparotomy, lysis of adhesions, segmental small bowel resection, gastrojejunostomy) as empiric coverage for new leukocytosis.  On 12/29 CT demonstrated bowel leak at G-J suture with development of intra-abdominal fluid collections.    D10 resumption of IV abx Percutaneous drainage of the largest collection per IR 12/31 Now with EC fistula, fecal material noted in drain & midline incision Completed 9 days Vancomycin & Zosyn 12/31 wound infection Cx resulted: abx change to Rocephin  Plan:   Rocephin 2gm q24   Height: 6\' 2"  (188 cm) Weight: 173 lb 11.6 oz (78.8 kg) IBW/kg (Calculated) : 82.2  Temp (24hrs), Avg:98.3 F (36.8 C), Min:97.7 F (36.5 C), Max:99.4 F (37.4 C)   Recent Labs Lab 02/11/16 0323 02/12/16 0346 02/12/16 1256 02/13/16 0330 02/15/16 0346 02/16/16 0421 02/17/16 0444  WBC 14.5* 14.6*  --  15.5* 11.8* 13.2* 14.5*  CREATININE 0.76 0.77  --  0.68 0.84 0.63  --   VANCOTROUGH  --   --  25*  --   --   --   --     Estimated Creatinine Clearance: 87.6 mL/min (by C-G formula based on SCr of 0.63 mg/dL).    Allergies  Allergen Reactions  . Flomax [Tamsulosin Hcl] Other (See Comments)    Leg weakness  . Lisinopril Cough  . Lorazepam     "i don't like it"  Prefers Xanax or valium  . Uroxatral [Alfuzosin Hcl Er] Other (See Comments)    Leg weakness   Antimicrobials this admission:  12/6 Cefepime >> 12/6 12/6 Vancomycin >> 12/7, resume 12/25 >> 1/2 12/12 Zosyn >> 12/21, resume 12/25 >> 1/3 1/3 Rocephin >>  Levels/Dosage adjustments: 12/29: VT 25 on vanc 1g q12h ; dosage reduced to 1500mg  q24h  Microbiology results: 12/7 influenza A/B negative   12/23 MRSA PCR: negative 12/31 Abd abscess: K pneumo res to Zosyn, Amp, Unasyn, Ancef                                    E coli res to Amp, Unasyn   Thank you for allowing pharmacy to be a part of this patient's care.  Otho BellowsGreen, Danaly Bari L PharmD Pager [phone] 02/17/2016, 3:08 PM

## 2016-02-18 LAB — CBC
HCT: 25.3 % — ABNORMAL LOW (ref 39.0–52.0)
HEMOGLOBIN: 8.3 g/dL — AB (ref 13.0–17.0)
MCH: 30 pg (ref 26.0–34.0)
MCHC: 32.8 g/dL (ref 30.0–36.0)
MCV: 91.3 fL (ref 78.0–100.0)
Platelets: 776 10*3/uL — ABNORMAL HIGH (ref 150–400)
RBC: 2.77 MIL/uL — AB (ref 4.22–5.81)
RDW: 15.8 % — ABNORMAL HIGH (ref 11.5–15.5)
WBC: 14.5 10*3/uL — ABNORMAL HIGH (ref 4.0–10.5)

## 2016-02-18 LAB — COMPREHENSIVE METABOLIC PANEL
ALT: 20 U/L (ref 17–63)
AST: 21 U/L (ref 15–41)
Albumin: 1.4 g/dL — ABNORMAL LOW (ref 3.5–5.0)
Alkaline Phosphatase: 283 U/L — ABNORMAL HIGH (ref 38–126)
Anion gap: 5 (ref 5–15)
BUN: 25 mg/dL — AB (ref 6–20)
CHLORIDE: 104 mmol/L (ref 101–111)
CO2: 24 mmol/L (ref 22–32)
CREATININE: 0.68 mg/dL (ref 0.61–1.24)
Calcium: 8.4 mg/dL — ABNORMAL LOW (ref 8.9–10.3)
GFR calc Af Amer: >60 mL/min (ref >60)
GFR calc non Af Amer: >60 mL/min (ref >60)
Glucose, Bld: 115 mg/dL — ABNORMAL HIGH (ref 65–99)
Potassium: 3.8 mmol/L (ref 3.5–5.1)
SODIUM: 133 mmol/L — AB (ref 135–145)
Total Bilirubin: 0.2 mg/dL — ABNORMAL LOW (ref 0.3–1.2)
Total Protein: 6.2 g/dL — ABNORMAL LOW (ref 6.5–8.1)

## 2016-02-18 LAB — PHOSPHORUS: Phosphorus: 4 mg/dL (ref 2.5–4.6)

## 2016-02-18 LAB — GLUCOSE, CAPILLARY
GLUCOSE-CAPILLARY: 128 mg/dL — AB (ref 65–99)
GLUCOSE-CAPILLARY: 130 mg/dL — AB (ref 65–99)
Glucose-Capillary: 105 mg/dL — ABNORMAL HIGH (ref 65–99)
Glucose-Capillary: 127 mg/dL — ABNORMAL HIGH (ref 65–99)
Glucose-Capillary: 131 mg/dL — ABNORMAL HIGH (ref 65–99)

## 2016-02-18 LAB — MAGNESIUM: Magnesium: 1.8 mg/dL (ref 1.7–2.4)

## 2016-02-18 MED ORDER — INSULIN ASPART 100 UNIT/ML ~~LOC~~ SOLN
0.0000 [IU] | SUBCUTANEOUS | Status: DC
  Administered 2016-02-19 – 2016-02-26 (×8): 1 [IU] via SUBCUTANEOUS

## 2016-02-18 MED ORDER — FAT EMULSION 20 % IV EMUL
240.0000 mL | INTRAVENOUS | Status: AC
  Administered 2016-02-18: 240 mL via INTRAVENOUS
  Filled 2016-02-18: qty 250

## 2016-02-18 MED ORDER — TRACE MINERALS CR-CU-MN-SE-ZN 10-1000-500-60 MCG/ML IV SOLN
INTRAVENOUS | Status: AC
  Administered 2016-02-18: 19:00:00 via INTRAVENOUS
  Filled 2016-02-18: qty 1988

## 2016-02-18 NOTE — Progress Notes (Signed)
Referring Physician(s): [name],M  Supervising Physician: [name]  Patient Status:  [name] - In-pt  Chief Complaint:  LLQ abdominal abscess  Subjective:  Pt doing ok; no acute changes; sitting up in chair  Allergies: Flomax [tamsulosin hcl]; Lisinopril; Lorazepam; and Uroxatral [alfuzosin hcl er]  Medications: Prior to Admission medications   Medication Sig Start Date End Date Taking? Authorizing Provider  aluminum-magnesium hydroxide-simethicone (MAALOX) 200-200-20 MG/5ML SUSP Take 30 mLs by mouth every 6 (six) hours as needed.   Yes Historical Provider, MD  Amino Ac Elect-Calc in D10W (CLINIMIX E/DEXTROSE, 2.75/10,) 2.75 % SOLN Inject 2,160 mLs into the vein as needed.   Yes Historical Provider, MD  aspirin 81 MG chewable tablet Place 81 mg into feeding tube every evening.   Yes Historical Provider, MD  atorvastatin (LIPITOR) 40 MG tablet Place 40 mg into feeding tube at bedtime.    Yes Historical Provider, MD  bisacodyl (DULCOLAX) 10 MG suppository Place 10 mg rectally daily as needed for moderate constipation or severe constipation.   Yes Historical Provider, MD  bismuth subsalicylate (PEPTO BISMOL) 262 MG/15ML suspension Place 30 mLs into feeding tube every 8 (eight) hours as needed for indigestion or diarrhea or loose stools.   Yes Historical Provider, MD  calcium-vitamin D (OSCAL WITH D) 500-200 MG-UNIT tablet Place 1 tablet into feeding tube daily with breakfast.    Yes Historical Provider, MD  Cyanocobalamin (VITAMIN B-12) 1000 MCG SUBL Place 1 tablet under the tongue daily with breakfast.   Yes Historical Provider, MD  Ferrous Sulfate 220 (44 Fe) MG/5ML LIQD Place 7.5 mLs into feeding tube at bedtime.    Yes Historical Provider, MD  FLUoxetine (PROZAC) 20 MG/5ML solution Place 20 mg into feeding tube daily.   Yes Historical Provider, MD  haloperidol (HALDOL) 0.5 MG tablet Take 0.25-0.5 mg by mouth 3 (three) times daily. At 6 am and 4 PM and 9 PM   Yes Historical  Provider, MD  haloperidol (HALDOL) 0.5 MG tablet Place 0.5 mg into feeding tube as needed for agitation. STOP DATE 01/24/2016 01/10/16 01/24/16 Yes Historical Provider, MD  loperamide (IMODIUM) 1 MG/5ML solution 10 mg. Take 10 mls via tube every 8 hours as needed for more than 2 bms every 8 hours.   Yes Historical Provider, MD  magic mouthwash SOLN Take 15 mLs by mouth 4 (four) times daily as needed for mouth pain (sore throat). 12/30/15  Yes Danny Soda, MD  metoCLOPramide (REGLAN) 5 MG/5ML solution Place 10 mg into feeding tube every 6 (six) hours as needed for nausea or vomiting.   Yes Historical Provider, MD  metoprolol tartrate (LOPRESSOR) 25 MG tablet Place 25 mg into feeding tube daily.   Yes Historical Provider, MD  nicotine (NICODERM CQ - DOSED IN MG/24 HR) 7 mg/24hr patch Place 7 mg onto the skin daily. Rotate site   Yes Historical Provider, MD  nitroGLYCERIN (NITROSTAT) 0.4 MG SL tablet Place 0.4 mg under the tongue every 5 (five) minutes as needed for chest pain.   Yes Historical Provider, MD  Nutritional Supplements (FEEDING SUPPLEMENT, OSMOLITE 1.2 CAL,) LIQD Place 237 mLs into feeding tube. Give 1 can via G-tube every day at  10 am and  6 pm.   Yes Historical Provider, MD  omeprazole (PRILOSEC) 40 MG capsule Take 40 mg by mouth 2 (two) times daily.   Yes Historical Provider, MD  Polyvinyl Alcohol-Povidone (REFRESH OP) Place 2 drops into both eyes as needed (For dry eyes.).    Yes Historical Provider,  MD  potassium chloride (KCL) 2 mEq/mL SOLN oral liquid Take 30 mEq by mouth daily.   Yes Historical Provider, MD  RESTASIS 0.05 % ophthalmic emulsion Place 1 drop into both eyes 2 (two) times daily.  04/16/13  Yes Historical Provider, MD  traZODone (DESYREL) 50 MG tablet Place 100 mg into feeding tube at bedtime.    Yes Historical Provider, MD  Water For Irrigation, Sterile (STERILE WATER FOR IRRIGATION) Irrigate with 250 mLs as directed every 6 (six) hours.    Historical Provider, MD      Vital Signs: BP (!) 143/53 (BP Location: Left Arm)   Pulse (!) 103   Temp 97.6 F (36.4 C) (Oral)   Resp 16   Ht 6\' 2"  (1.88 m)   Wt 176 lb 5.9 oz (80 kg)   SpO2 94%   BMI 22.64 kg/m   Physical Exam LLQ drain intact, insertion site ok, output 15-20 cc  Imaging: No results found.  Labs:  CBC:  Recent Labs  02/15/16 0346 02/16/16 0421 02/17/16 0444 02/18/16 0422  WBC 11.8* 13.2* 14.5* 14.5*  HGB 8.0* 8.2* 8.5* 8.3*  HCT 24.0* 24.9* 26.1* 25.3*  PLT 648* 706* 830* 776*    COAGS:  Recent Labs  12/04/15 2020 12/11/15 0405 12/13/15 1402 02/13/16 1140  INR 1.62 1.25 1.26 1.18  APTT 39*  --  38*  --     BMP:  Recent Labs  02/13/16 0330 02/15/16 0346 02/16/16 0421 02/18/16 0835  NA 134* 134* 134* 133*  K 3.8 3.8 3.6 3.8  CL 103 104 104 104  CO2 25 26 25 24   GLUCOSE 112* 126* 121* 115*  BUN 25* 25* 24* 25*  CALCIUM 8.0* 8.2* 8.3* 8.4*  CREATININE 0.68 0.84 0.63 0.68  GFRNONAA >60 >60 >60 >60  GFRAA >60 >60 >60 >60    LIVER FUNCTION TESTS:  Recent Labs  02/11/16 0323 02/13/16 0330 02/15/16 0346 02/18/16 0835  BILITOT 0.7 0.3 0.3 0.2*  AST 28 30 25 21   ALT 23 32 27 20  ALKPHOS 280* 257* 287* 283*  PROT 5.1* 5.4* 5.6* 6.2*  ALBUMIN 1.2* 1.2* 1.3* 1.4*    Assessment and Plan: S/p ex lap with SBR and now with EC fistula and LLQ abscess, s/p perc drain on 12/31-drain in place and draining well. Draining enteric contents. WBC 14.5, hgb 8.3(8.5); recheck f/u CT in 2-3 days; antbx per pharm   Electronically Signed: D. Jeananne RamaKevin Ulyess [name] 02/18/2016, 12:27 PM   I spent a total of 15 minutes at the the patient's bedside AND on the patient's hospital floor or unit, greater than 50% of which was counseling/coordinating care for abdominal abscess drain    Patient ID: [name], male   DOB: [birth year], 76 y.o.   MRN: [id number]

## 2016-02-18 NOTE — Progress Notes (Signed)
PHARMACY - ADULT TOTAL PARENTERAL NUTRITION CONSULT NOTE   Pharmacy Consult for TPN Indication: PTA TPN continuation; bowel perforation  Insulin Requirements:  SSI:  2 units Novolog sensitive SSI in past 24 hours TPN: 10 units regular insulin per day in TPN   Current Nutrition:  - NPO - Clinmix-E 5/20 at 83 mL/hr, Fat Emulsion 20% at 10 mL/hr  IVF: off  Central access: PICC TPN start date: PTA, restarted on admission on 12/6    Recent Labs  02/16/16 0421 02/18/16 0835  NA 134* 133*  K 3.6 3.8  CL 104 104  CO2 25 24  GLUCOSE 121* 115*  BUN 24* 25*  CREATININE 0.63 0.68  CALCIUM 8.3* 8.4*  PHOS 4.2 4.0  MG 1.7 1.8  ALBUMIN  --  1.4*  ALKPHOS  --  283*  AST  --  21  ALT  --  20  BILITOT  --  0.2*  Corr Ca: 10.2 on 12/30  ASSESSMENT                                                                                                          HPI: 89 yoM with hx PUD, CAD, GERD, BPH,  s/p multiple procedures  and extended hospitalization for recurrent partial GOO 2/2 pyloric stricture on TPN for several days, discharged 11/15 with J/G tube in place on TFs, unable to tolerate and had to transition back to TPN via [facility] on 11/22, readmitted 12/6 with partial SBO. Pharmacy consulted to resume TPN. Note: PTA TNA from [place] was as follows: Totals per day: 405 grams (1376 kcal)  of dextrose, 75 grams (300 kcal) of protein and 50 grams ( 480 kcal) of lipid for a total of 2156 kcal/day   Significant events:  12/6: TPN continued on admission.  12/7: Pt pulled out NGT, not replaced, remains NPO 12/11: Awaiting MBS, if no aspiration, will start to advance diet.  Having bowel function.  Continue TPN for now.   12/12: Severe aspiration risk per MBS yesterday - recommend continuing NPO. Osmolite 1.2 CAL via TF BID ordered 12/11 at 6pm. Per discussion with CCS PA, continue TPN at current goal rate today, wean tomorrow if tolerates TF. 12/13: Did not tolerate bolus feeds through  G-tube; overnight emesis and aspiration event, started on Zosyn. CCS trying trickle feeds today. 12/14 transition to cyclic TPN - appears recovery may be prolonged. Not tolerating trickle feeds; G-tube to drainage.  12/15: TPN was not run at cyclic rate overnight d/t conflicting information in TPN order. To provide patient full needed nutrition, will continue to run TPN at 83 ml/hr over 24 hr and begin 18-hr cycle with tonight's TPN 12/16: start 12h TPN cycle 12/18: resume trickle tube feed at 5 ml/hr 12/19: Nausea/vomiting, tube feed held. TPN formula changed to E 5/20 from E 5/15 12/21: successful transition of G tube to [place] tube. Plan scan for patency before resuming tube feeds.  12/22: Upper GI scan in IR today to ensure J-tube working.  12/23: exp lap, lysis of adhesions, segmental small bowel  resection, gastrojejunostomy 12/25: TPN converted from cyclic back to continuous due to hypoglycemic event following end of TPN cycle and patient likely to remain hospitalized for at least several more days.  Prealbumin low, falling - Fat Emulsion increased to 10 mL/hr.  Clinimix already at 2L/day. 12/28: to start trickle feeds thru J-tube; G-tube remains on drainage 12/29: TF increased from 10 ml/hr to 20 ml/hr  12/29 PM: TF was found draining from midline incision, CT abdomen c/w bowel perforation at G-J suture site; interval development of abscesses .  TF was stopped and is to remain off until MD orders otherwise. 12/31: Plan percutaneous drainage of largest LLQ fluid collection today in IR.  Because of [facility] at [facility], procedure will be performed at [facility] and [facility] will be [facility]-procedure.  1/2: Drain output 345 ml past 24 hr, changing abd dressing ~ every 4 hr. 1/3: EC Fistula - feculent material from drain placed 12/31 after perc drainage  & in midline wound.  1/4 switch to cyclic TPN  Today, 10/20/930:   Glucose within goal range (100-150). No hx DM.[name]   Electrolytes (12/30): Na  slightly low, others WNL  Renal (12/30) - SCr WNL, BUN elevated but decreasing.    LFTs (12/30):  Alk phos elevated (~2x ULN).  All others < ULN.  TGs (12/24): WNL  Prealbumin:  15.3 ( 11/13), 10.8 (12/7), 12.8 (12/11), 13.1 (12/18), 8.9 (12/25) - falling  Total abd drainage: 515 ml/24 hr  NUTRITIONAL GOALS                                                                                             RD recs (12/26): Kcal 2000-2200, Protein 100-115g, Fluid 2L/day [facility] TPN: 2156 Kcal/day, Protein 75g/day  Clinimix E 5/20 at a goal rate of 83 ml/hr + 20% fat emulsion at 10 ml/hr to provide: 100 g/day protein, 2240 Kcal/day. (+++There is currently a Psychologist, prison and probation services of Clinimix solution.  Pharmacy will use Clinimix product based on what we have available in stock+++)  PLAN                                                                                   At 1800 today:  Start cycling Clinimix E 5/20 at 50 mls/hr x 1 hour then increase to 118 mls/hr x 16 hours then 50 mls/hr x 1 hour then stop  Cycle  20% fat emulsion over 18 hours  Continue 10 units regular insulin per day in TPN.   TPN to contain standard multivitamins and trace elements.  Enteral feeds to remain off due to bowel leak  Adjust sensitive SSI and CBG checks   TPN lab panels on Mondays & Thursdays  Follow clinical course daily.  Dolly Rias RPh 02/18/2016, 1:08 PM Pager [phone]

## 2016-02-18 NOTE — Care Management Note (Signed)
Case Management Note  Patient Details  Name: [name] MRN: [id number] Date of Birth: [birth year]  Subjective/Objective:  CM/CSW spoke to patient in rm about d/c plans. Currently patient/spouse want home but will keep all options open for SNF(aware of limitations w/SNF d/t current TPN,trach)[facility] already following-Active w/[facility] for TPN. Spouse & dtr support. Spouse has Long term care insurance-provided her w/private duty care agency list(independent decision)-spouse/dtr voiced understanding. Continue to monitor for d/c needs.                  Action/Plan:d/c plan home w/HHC.   Expected Discharge Date:                  Expected Discharge Plan:  Home w Home Health Services  In-House Referral:     Discharge planning Services  CM Consult  Post Acute Care Choice:  Home Health (Active w/[facility] infusion-TPN) Choice offered to:  Patient, Adult Children  DME Arranged:    DME Agency:     HH Arranged:    HH Agency:     Status of Service:  In process, will continue to follow  If discussed at Long Length of Stay Meetings, dates discussed:    Additional Comments:  Lanier ClamMahabir, Danny Anglin, RN 02/18/2016, 3:30 PM

## 2016-02-18 NOTE — Progress Notes (Signed)
Physical Therapy Treatment Patient Details Name: [name] MRN: [id number] DOB: 11/21/1940 Today's Date: 02/18/2016    History of Present Illness re admit from [facility] 12/05.Dx N/V r/o ileus. S/P 02/06/16 forEXPLORATORY LAPAROTOMY, LYSIS OF ADHESIONS, SEGMENTAL SMALL BOWEL RESECTION, GASTROJEJUNOSTOMY 12/23    PT Comments    Pt progressing with his mobility. Today c/o headache (reported to RN) Assisted OOB to amb still requires + 2 for safety (equipment/recliner to follow)  C/o increased weakness and overall fatigue this session.  HgB 8.3  Follow Up Recommendations  SNF;Supervision/Assistance - 24 hour;Home health PT     Equipment Recommendations       Recommendations for Other Services       Precautions / Restrictions Precautions Precautions: Fall Precaution Comments: recent LAP with mod ABD drainage, 2 drains, 1 bulb     TRACH collar  PCA Restrictions Weight Bearing Restrictions: No    Mobility  Bed Mobility Overal bed mobility: Needs Assistance Bed Mobility: Supine to Sit           General bed mobility comments: more able to self rise with less ABD pain today    requires assist for upper body due to recent ABD surgery  Transfers Overall transfer level: Needs assistance Equipment used: None Transfers: Sit to/from Stand Sit to Stand: Min assist;+2 safety/equipment;+2 physical assistance;From elevated surface         General transfer comment: 25% VC's on proper hand placement to increase self effort and VC's for hand placement prior to sit  Ambulation/Gait Ambulation/Gait assistance: Min assist;+2 safety/equipment Ambulation Distance (Feet): 60 Feet Assistive device: Bilateral platform walker Gait Pattern/deviations: Step-to pattern;Step-through pattern;Shuffle Gait velocity: decreased   General Gait Details: decreased amb distance this session due to c/o headache and fatigue.  initial posterior lean and noted overall weakness.    Stairs             Wheelchair Mobility    Modified Rankin (Stroke Patients Only)       Balance                                    Cognition Arousal/Alertness: Awake/alert Behavior During Therapy: WFL for tasks assessed/performed                   General Comments: slightly more confused this session but I did just wake him up    Exercises      General Comments        Pertinent Vitals/Pain Pain Assessment: Faces Faces Pain Scale: Hurts little more Pain Location: headache        used PCA x 2 for back and ABD pain Pain Descriptors / Indicators: Grimacing Pain Intervention(s): Patient requesting pain meds-RN notified    Home Living                      Prior Function            PT Goals (current goals can now be found in the care plan section) Progress towards PT goals: Progressing toward goals    Frequency    Min 3X/week      PT Plan Current plan remains appropriate    Co-evaluation             End of Session Equipment Utilized During Treatment: Gait belt Activity Tolerance: Patient limited by pain;Patient limited by fatigue Patient left: in chair;with family/visitor present;with call bell/phone  within reach;with chair alarm set     Time: 1002-1031 PT Time Calculation (min) (ACUTE ONLY): 29 min  Charges:  $Gait Training: 8-22 mins $Therapeutic Activity: 8-22 mins                    G Codes:      Felecia Shelling  PTA [facility]  Acute  Rehab Pager      [phone]

## 2016-02-18 NOTE — Consult Note (Signed)
WOC Nurse wound follow up Wound type: midline full thickness surgical incision with EC fistula. Large Eakin pouch applied on Tuesday is still intact with no evidence of leakage.  Will continue to assess for need to change so that we can determine change schedule. Measurement: Per Tuesday, 19cm x 2cm x 2cm  Wound bed: Red, moist Drainage (amount, consistency, odor) Orange brown effluent, moderate amount Periwound:Not seen today Dressing procedure/placement/frequency: Patient on mattress replacement with low air loss feature for pressure injury prevention and microclimate management.  Emotional support provided for continued abdominal issues. Patient acknowledges that pouch does prevent frequent painful dressing changes and raw skin. Will see in am for fistula pouch assessment. WOC nursing team will follow, and will remain available to this patient, the nursing, surgical and medical teams.   Thanks, Ladona MowLaurie Toia Micale, MSN, RN, GNP, Hans EdenCWOCN, CWON-AP, FAAN  Pager# [phone]

## 2016-02-18 NOTE — Progress Notes (Signed)
12 Days Post-Op  Subjective: No significant change.  Open wound is the same, still draining feculent fluid.  Gastrostomy tube is draining green clear fluid.  The JP/IR drain is still purulent.  He is up walking some with PT.  His attitude seems better, he is getting adequate comfort from the Morphine PCA.  Objective: Vital signs in last 24 hours: Temp:  [97.6 F (36.4 C)-98.5 F (36.9 C)] 97.6 F (36.4 C) (01/04 0538) Pulse Rate:  [70-101] 89 (01/04 0538) Resp:  [13-20] 16 (01/04 0759) BP: (116-143)/(45-60) 143/53 (01/04 0538) SpO2:  [95 %-99 %] 96 % (01/04 0759) FiO2 (%):  [28 %] 28 % (01/04 0538) Weight:  [80 kg (176 lb 5.9 oz)] 80 kg (176 lb 5.9 oz) (01/04 0547) Last BM Date: 02/08/16 1126 IV Urine 1625 640 gastrostomy 150 stool Afebrile, VSS WBC still 14.5  Intake/Output from previous day: 01/03 0701 - 01/04 0700 In: 1146 [I.V.:1126] Out: 2440 [Urine:1625; Drains:640; Stool:175] Intake/Output this shift: No intake/output data recorded.  General appearance: alert, cooperative and no distress Resp: clear to auscultation bilaterally and BS down some in the base bilat, I encouraged him again to use the IS GI: The open wound still draining into Eakins pouch.  Feculent fluid, + bs and gas in [place].  Lab Results:   Recent Labs  02/17/16 0444 02/18/16 0422  WBC 14.5* 14.5*  HGB 8.5* 8.3*  HCT 26.1* 25.3*  PLT 830* 776*    BMET  Recent Labs  02/16/16 0421  NA 134*  K 3.6  CL 104  CO2 25  GLUCOSE 121*  BUN 24*  CREATININE 0.63  CALCIUM 8.3*   PT/INR No results for input(s): LABPROT, INR in the last 72 hours.   Recent Labs Lab 02/13/16 0330 02/15/16 0346  AST 30 25  ALT 32 27  ALKPHOS 257* 287*  BILITOT 0.3 0.3  PROT 5.4* 5.6*  ALBUMIN 1.2* 1.3*     Lipase     Component Value Date/Time   LIPASE 55 (H) 01/20/2016 0158     Studies/Results: No results found.  Medications: . sodium chloride   Intravenous Once  . alteplase  2 mg  Intracatheter Once  . cefTRIAXone (ROCEPHIN)  IV  2 g Intravenous Q24H  . chlorhexidine  15 mL Mouth Rinse BID  . cycloSPORINE  1 drop Both Eyes BID  . enoxaparin (LOVENOX) injection  40 mg Subcutaneous Q24H  . insulin aspart  0-9 Units Subcutaneous Q6H  . lip balm  1 application Topical BID  . liver oil-zinc oxide   Topical BID  . mouth rinse  15 mL Mouth Rinse q12n4p  . morphine   Intravenous Q4H  . nicotine  7 mg Transdermal Daily  . pantoprazole (PROTONIX) IV  40 mg Intravenous Daily    Klebsiella pneumoniae  MODERATE KLEBSIELLA PNEUMONIAE  Escherichia coli  MODERATE ESCHERICHIA COLI        S/P Robotic anterior and posterior vagotomy, BI anastomosis, DOR fundoplication, omentopexy for partial GOO 09/2015, DR. Viviann SpareSTEVEN [name]  Feeding Jejunostomy and gastrostomy/EGD balloon dilatation, secondary to edema at anastomosis, 12/01/15, DR. Karie SodaSteven [name] S/p diagnostic laparoscopy, omentopexy of jejunal disruption, and washout for jejunal disruption (pt pulled out jejunostomy tube)and gastric leak. 12/03/15, Dr. Karie SodaSteven [name] (pt pulled out jejunostomy tube)  Assessment/Plan PSBO with 2 days of nausea and vomiting on readmit 01/20/16 prior procedures above S/p EXPLORATORY LAPAROTOMY, LYSIS OF ADHESIONS, SEGMENTAL SMALL BOWEL RESECTION, GASTROJEJUNOSTOMY , 02/06/16, Dr. Chevis PrettyPaul [name] G tube dislodged 02/10/16, bed tansfer New EC fistula  midline 02/13/16/CT shows LLQ intraabdominal abscess CT guided LLQabdominal abscess drainage, 02/14/16; IR Dr. Fredia [name] Post op Ventricular tachycardia Tracheostomy Aspiration pneumonia/pleural effusion Malnutrition/deconditioning Anemia  Stage II sacral decubitus Malnutrition formerly on TF/now on TNA FEN: NPO/TNA  ID: Zosyn 02/06/16 thru 02/17/16; Vancomycin 02/08/16 thru 02/15/16;  Rocephin started 02/17/16 =>> day 2 DVT: Heparin/SCD    Plan:  Going to cyclic TNA, continue Rocephin.          LOS: 29 days     [name] 02/18/2016 [phone]

## 2016-02-19 ENCOUNTER — Inpatient Hospital Stay (HOSPITAL_COMMUNITY): Payer: Medicare Other

## 2016-02-19 LAB — GLUCOSE, CAPILLARY
Glucose-Capillary: 120 mg/dL — ABNORMAL HIGH (ref 65–99)
Glucose-Capillary: 100 mg/dL — ABNORMAL HIGH (ref 65–99)
Glucose-Capillary: 83 mg/dL (ref 65–99)

## 2016-02-19 LAB — CBC
HEMATOCRIT: 25.5 % — AB (ref 39.0–52.0)
Hemoglobin: 8.5 g/dL — ABNORMAL LOW (ref 13.0–17.0)
MCH: 29.9 pg (ref 26.0–34.0)
MCHC: 33.3 g/dL (ref 30.0–36.0)
MCV: 89.8 fL (ref 78.0–100.0)
PLATELETS: 837 10*3/uL — AB (ref 150–400)
RBC: 2.84 MIL/uL — AB (ref 4.22–5.81)
RDW: 15.8 % — ABNORMAL HIGH (ref 11.5–15.5)
WBC: 15.9 10*3/uL — ABNORMAL HIGH (ref 4.0–10.5)

## 2016-02-19 MED ORDER — IOPAMIDOL (ISOVUE-300) INJECTION 61%
INTRAVENOUS | Status: AC
  Administered 2016-02-19: 100 mL
  Filled 2016-02-19: qty 100

## 2016-02-19 MED ORDER — FAT EMULSION 20 % IV EMUL
240.0000 mL | INTRAVENOUS | Status: AC
  Administered 2016-02-19: 240 mL via INTRAVENOUS
  Filled 2016-02-19: qty 250

## 2016-02-19 MED ORDER — TRACE MINERALS CR-CU-MN-SE-ZN 10-1000-500-60 MCG/ML IV SOLN
INTRAVENOUS | Status: AC
  Administered 2016-02-19: 18:00:00 via INTRAVENOUS
  Filled 2016-02-19: qty 1988

## 2016-02-19 NOTE — Progress Notes (Signed)
Nutrition Follow-up  DOCUMENTATION CODES:   Severe malnutrition in context of acute illness/injury  INTERVENTION:  - Continue cyclic TPN per Pharmacy.  - RD will follow-up 1/9.  NUTRITION DIAGNOSIS:   Inadequate oral intake related to inability to eat as evidenced by NPO status. -ongoing  GOAL:   Patient will meet greater than or equal to 90% of their needs -  MONITOR:   Labs, Weight trends, Skin, I & O's, Other (Comment) (TPN, TF initiation?)  ASSESSMENT:   76 year-old male with a medical history of GERD, PUD, and CAD who presented to [facility] from [facility] via EMS with a cc of nausea, vomiting, and diarrhea. Patient is s/p Robotic anterior and posterior vagotomy, BI anastomosis, DOR fundoplication, omentopexy 09/2015 for partial GOO secondary to pyloric stricture, followed by placement of feeding jejunostomy and gastrostomy tube and EGD balloon dilation of duodenal stricture 12/01/15 for recurrent GOO secondary to edema at anastomosis and failure to thrive with malnutrition. On 12/03/15 the patient was taken to the OR for a diagnostic laparoscopy, omentopexy of jejunal disruption, and washout for jejunal disruption (pt pulled out jejunostomy tube) and gastric leak.   1/5 Pt transitioned to cyclic TPN yesterday. Plan per pharmacy outlined below. Pt will receive 1988 mL Clinimix E 5/20 + 234 mL 20% ILE per day. This will provide 2218 kcal and 99 grams of protein/day.  Weight stable from 1/2-1/4 but now +3.4 kg from yesterday; will continue to monitor closely.   PLAN At 1800 today:  Start cycling Clinimix E 5/20 at 50 mls/hr x 1 hour then increase to 118 mls/hr x 16 hours then 50 mls/hr x 1 hour then stop  Cycle 20% fat emulsion @ over 18 hours  Continue 10 units regular insulin per day in TPN.   TPN to contain standard multivitamins and trace elements.  Enteral feeds to remain off due to bowel leak  Adjust sensitive SSI and CBG checks   TPN lab  panels on Mondays & Thursdays  BMET tomorrow   Medications reviewed; sliding scale Novolog, PRN Zofran, PRN IV Compazine. Labs reviewed; CBG: 120 mg/dL, Na: 133 mmol/L, BUN: 25 mg/dL, Ca: 8.4 mg/dL, Alk Phos elevated.    1/2 - Pt remains on continuous TPN: Clinimix E 5/20 @ 83 mL/hr with 20% ILE @ 10 mL/hr which is providing 2240 kcal and 100 grams of protein to meet 100% estimated nutrition needs. - On 12/31 pt was transferred to [facility] for percutaneous drainage of abdominal abscess an 12 French drain was placed at that time.  - Weight has fluctuated frequently.  - Most recently, weight -4.7 kg over 5 days.  - Pt denies any discomfort at time of RD visit earlier this AM.  - Wife and daughter were at bedside and were interacting with pt so visit was brief.    12/26 Per chart review, TPN will be converted back to continuous TPN infusion d/t hypoglycemic events. Clinimix-E 5/20 at 83 mL/hr + Fat Emulsion 20% at 10 mL/hr provides 100 g protein / day and 2240 KCal / day  Will monitor for TF trial via J-tube. Tube Feeding Recommendations: Initiate Vital 1.5@ 39m/hr via J-tubeand increase by 10 ml every 24hours to goal rate of 631mhr.  Tube feeding regimen provides 2160kcal (100% of needs), 97grams of protein, and 110091mf H2O.     Diet Order:  Diet NPO time specified TPN (CLINIMIX-E) Adult  Skin:    Abdominal wounds from 12/23 and 12/31  Last  BM:  12/25  Height:   Ht Readings from Last 1 Encounters:  01/21/16 6' 2" (1.88 m)    Weight:   Wt Readings from Last 1 Encounters:  02/19/16 183 lb 13.8 oz (83.4 kg)    Ideal Body Weight:  86.4 kg  BMI:  Body mass index is 23.61 kg/m.  Estimated Nutritional Needs:   Kcal:  2000-2200  Protein:  100-115g  Fluid:  2L/day  EDUCATION NEEDS:   No education needs identified at this time    Danny Matin, MS, RD, LDN, CNSC Inpatient Clinical Dietitian Pager # [phone] After hours/weekend pager #  [phone]

## 2016-02-19 NOTE — Progress Notes (Addendum)
PHARMACY - ADULT TOTAL PARENTERAL NUTRITION CONSULT NOTE   Pharmacy Consult for TPN Indication: PTA TPN continuation; bowel perforation  Insulin Requirements:  SSI:  3 units Novolog sensitive SSI in past 24 hours TPN: 10 units regular insulin per day in TPN   Current Nutrition:  - NPO - TPN to cyclic regimen 1/4, Fat Emulsion 20% at 13 mL/hr over 18 hr  IVF: off  Central access: PICC TPN start date: PTA, restarted on admission on 12/6    Recent Labs  02/18/16 0835  NA 133*  K 3.8  CL 104  CO2 24  GLUCOSE 115*  BUN 25*  CREATININE 0.68  CALCIUM 8.4*  PHOS 4.0  MG 1.8  ALBUMIN 1.4*  ALKPHOS 283*  AST 21  ALT 20  BILITOT 0.2*  Corr Ca: 10.2 on 12/30  ASSESSMENT                                                                                                          HPI: 38 yoM with hx PUD, CAD, GERD, BPH,  s/p multiple procedures  and extended hospitalization for recurrent partial GOO 2/2 pyloric stricture on TPN for several days, discharged 11/15 with J/G tube in place on TFs, unable to tolerate and had to transition back to TPN via [facility] on 11/22, readmitted 12/6 with partial SBO. Pharmacy consulted to resume TPN. Note: PTA TNA from [place] was as follows: Totals per day: 405 grams (1376 kcal)  of dextrose, 75 grams (300 kcal) of protein and 50 grams ( 480 kcal) of lipid for a total of 2156 kcal/day   Significant events:  12/6: TPN continued on admission.  12/7: Pt pulled out NGT, not replaced, remains NPO 12/11: Awaiting MBS, if no aspiration, will start to advance diet.  Having bowel function.  Continue TPN for now.   12/12: Severe aspiration risk per MBS yesterday - recommend continuing NPO. Osmolite 1.2 CAL via TF BID ordered 12/11 at 6pm. Per discussion with CCS PA, continue TPN at current goal rate today, wean tomorrow if tolerates TF. 12/13: Did not tolerate bolus feeds through G-tube; overnight emesis and aspiration event, started on Zosyn. CCS trying  trickle feeds today. 12/14 transition to cyclic TPN - appears recovery may be prolonged. Not tolerating trickle feeds; G-tube to drainage.  12/15: TPN was not run at cyclic rate overnight d/t conflicting information in TPN order. To provide patient full needed nutrition, will continue to run TPN at 83 ml/hr over 24 hr and begin 18-hr cycle with tonight's TPN 12/16: start 12h TPN cycle 12/18: resume trickle tube feed at 5 ml/hr 12/19: Nausea/vomiting, tube feed held. TPN formula changed to E 5/20 from E 5/15 12/21: successful transition of G tube to [place] tube. Plan scan for patency before resuming tube feeds.  12/22: Upper GI scan in IR today to ensure J-tube working.  12/23: exp lap, lysis of adhesions, segmental small bowel resection, gastrojejunostomy 12/25: TPN converted from cyclic back to continuous due to hypoglycemic event following end of TPN cycle and patient likely to remain  hospitalized for at least several more days.  Prealbumin low, falling - Fat Emulsion increased to 10 mL/hr.  Clinimix already at 2L/day. 12/28: to start trickle feeds thru J-tube; G-tube remains on drainage 12/29: TF increased from 10 ml/hr to 20 ml/hr  12/29 PM: TF was found draining from midline incision, CT abdomen c/w bowel perforation at G-J suture site; interval development of abscesses .  TF was stopped and is to remain off until MD orders otherwise. 12/31: Plan percutaneous drainage of largest LLQ fluid collection today in IR.  Because of construction at [facility], procedure will be performed at [facility] and patient will be [facility] to [facility] post-procedure.  1/2: Drain output 345 ml past 24 hr, changing abd dressing ~ every 4 hr. 1/3: EC Fistula - feculent material from drain placed 12/31 after perc drainage  & in midline wound.  1/4 switch to cyclic TPN  Today, 04/20/6281:   Glucose within goal range (100-150). Max CBG 131   Electrolytes 1/4: Na slightly low, others WNL  Renal - SCr WNL, BUN elevated but decreasing.     LFTs:  Alk phos elevated, unchanged (~2x ULN).  All others < ULN.  TGs (12/24) 134, (1/1) 145  Prealbumin:  15.3 ( 11/13), 10.8 (12/7), 12.8 (12/11), 13.1 (12/18), 8.9 (12/25), 10 (1/1)  Total drains: 635 ml/24 hr  NUTRITIONAL GOALS                                                                                             RD recs (12/26): Kcal 2000-2200, Protein 100-115g, Fluid 2L/day [facility] TPN: 2156 Kcal/day, Protein 75g/day  Clinimix E 5/20 at a goal rate of 83 ml/hr + 20% fat emulsion at 10 ml/hr to provide: 100 g/day protein, 2240 Kcal/day. (+++There is currently a Psychologist, prison and probation services of Clinimix solution.  Pharmacy will use Clinimix product based on what we have available in stock+++)  PLAN                                                                                   At 1800 today:  Start cycling Clinimix E 5/20 at 50 mls/hr x 1 hour then increase to 118 mls/hr x 16 hours then 50 mls/hr x 1 hour then stop  Cycle  20% fat emulsion over 18 hours  Continue 10 units regular insulin per day in TPN.   TPN to contain standard multivitamins and trace elements.  Enteral feeds to remain off due to bowel leak  Adjust sensitive SSI and CBG checks   TPN lab panels on Mondays & Thursdays  BMET tomorrow  Follow clinical course daily.  Minda Ditto PharmD Pager [phone] 02/19/2016, 11:33 AM

## 2016-02-19 NOTE — Progress Notes (Signed)
Occupational Therapy Treatment Patient Details Name: Ardyth GalRalph T Hardt MRN: [id number] DOB: 1940/10/04 Today's Date: 02/19/2016    History of present illness re admit from [facility] 12/05.Dx N/V r/o ileus. S/P 02/06/16 forEXPLORATORY LAPAROTOMY, LYSIS OF ADHESIONS, SEGMENTAL SMALL BOWEL RESECTION, GASTROJEJUNOSTOMY 12/23   OT comments  Pain limiting pt this OT session  Follow Up Recommendations  SNF;Home health OT;Supervision/Assistance - 24 hour          Precautions / Restrictions Precautions Precautions: Fall Precaution Comments: recent LAP with mod ABD drainage, 2 drains, 1 bulb     TRACH collar  PCA Restrictions Weight Bearing Restrictions: No       Mobility Bed Mobility               General bed mobility comments: NT  Transfers                 General transfer comment: NT        ADL Overall ADL's : Needs assistance/impaired     Grooming: Wash/dry face;Min guard;Bed level Grooming Details (indicate cue type and reason): pt needed max encouragement- declined BUE exercise even with much encouragement                               General ADL Comments: pt grimacing in pain and stating it was abdomen area. Encouraged pt to use PCA, also Aed pt reposition in bed and put pillow under knees to decrease pressure on incision area.                Cognition   Behavior During Therapy: WFL for tasks assessed/performed Overall Cognitive Status: Within Functional Limits for tasks assessed                                    Pertinent Vitals/ Pain       Pain Assessment: Faces Pain Score: 7  Pain Location: stomach area Pain Descriptors / Indicators: Guarding;Grimacing Pain Intervention(s): Monitored during session;Repositioned;PCA encouraged     Prior Functioning/Environment              Frequency  Min 2X/week        Progress Toward Goals  OT Goals(current goals can now be found in the care plan section)  Progress  towards OT goals: Progressing toward goals     Plan Discharge plan remains appropriate       End of Session     Activity Tolerance Patient limited by pain   Patient Left in bed;with call bell/phone within reach;with family/visitor present           Time: 1120-1139 OT Time Calculation (min): 19 min  Charges: OT Evaluation $OT Eval Low Complexity: 1 Procedure OT Treatments $Self Care/Home Management : 8-22 mins  Yanni Quiroa, Metro KungLorraine D 02/19/2016, 12:38 PM

## 2016-02-19 NOTE — Consult Note (Signed)
WOC Nurse wound follow up Large Eakin pouch over fistula is intact and being emptied regularly by bedside staff. No evidence of leakage behind skin barrier.  Will not change today.  I will continue to monitor over weekend and am prepared to change as indicated.  Supplies in room. WOC nursing team will not follow, but will remain available to this patient, the nursing and medical teams.  Please re-consult if needed. Thanks, Ladona MowLaurie Jaelie Aguilera, MSN, RN, GNP, Hans EdenCWOCN, CWON-AP, FAAN  Pager# [phone]

## 2016-02-19 NOTE — Progress Notes (Signed)
13 Days Post-Op  Subjective: No real change, Eakin's drainage looks feculent, IR drain is purulent and Gastrotomy is UGI colored.  He is tolerating everything as stoically as he can.    Objective: Vital signs in last 24 hours: Temp:  [97.1 F (36.2 C)-97.6 F (36.4 C)] 97.6 F (36.4 C) (01/05 0507) Pulse Rate:  [83-105] 96 (01/05 0507) Resp:  [12-21] 15 (01/05 0507) BP: (131-151)/(51-62) 139/51 (01/05 0507) SpO2:  [94 %-99 %] 98 % (01/05 0805) FiO2 (%):  [2 %-28 %] 28 % (01/05 0309) Weight:  [83.4 kg (183 lb 13.8 oz)] 83.4 kg (183 lb 13.8 oz) (01/05 0507) Last BM Date: 02/08/16 TNA  1419 Urine 825 Drain 610 - gastrostomy Stool -Eakins - 250 Afebrile, VSS WBC is up , anemia is stable Intake/Output from previous day: 01/04 0701 - 01/05 0700 In: 1639.4 [I.V.:1419.4; IV Piggyback:210] Out: 1685 [Urine:825; Drains:610; Stool:250] Intake/Output this shift: No intake/output data recorded.  General appearance: alert, cooperative and no distress Cardio: regular rate and rhythm, S1, S2 normal, no murmur, click, rub or gallop Chest clear, down in bases some ABD:  Soft, + BS, Eakin's, drain, and gastrotomy are about the same. Lab Results:   Recent Labs  02/18/16 0422 02/19/16 0448  WBC 14.5* 15.9*  HGB 8.3* 8.5*  HCT 25.3* 25.5*  PLT 776* 837*    BMET  Recent Labs  02/18/16 0835  NA 133*  K 3.8  CL 104  CO2 24  GLUCOSE 115*  BUN 25*  CREATININE 0.68  CALCIUM 8.4*   PT/INR No results for input(s): LABPROT, INR in the last 72 hours.   Recent Labs Lab 02/13/16 0330 02/15/16 0346 02/18/16 0835  AST 30 25 21   ALT 32 27 20  ALKPHOS 257* 287* 283*  BILITOT 0.3 0.3 0.2*  PROT 5.4* 5.6* 6.2*  ALBUMIN 1.2* 1.3* 1.4*     Lipase     Component Value Date/Time   LIPASE 55 (H) 01/20/2016 0158     Studies/Results: No results found.  Medications: . sodium chloride   Intravenous Once  . alteplase  2 mg Intracatheter Once  . cefTRIAXone (ROCEPHIN)  IV  2 g  Intravenous Q24H  . chlorhexidine  15 mL Mouth Rinse BID  . cycloSPORINE  1 drop Both Eyes BID  . enoxaparin (LOVENOX) injection  40 mg Subcutaneous Q24H  . insulin aspart  0-9 Units Subcutaneous 4 times per day  . lip balm  1 application Topical BID  . liver oil-zinc oxide   Topical BID  . mouth rinse  15 mL Mouth Rinse q12n4p  . morphine   Intravenous Q4H  . nicotine  7 mg Transdermal Daily  . pantoprazole (PROTONIX) IV  40 mg Intravenous Daily    Assessment/Plan PSBO with 2 days of nausea and vomiting on readmit 01/20/16 prior procedures above S/p EXPLORATORY LAPAROTOMY, LYSIS OF ADHESIONS, SEGMENTAL SMALL BOWEL RESECTION, GASTROJEJUNOSTOMY , 02/06/16, Dr. Chevis PrettyPaul Toth G tube dislodged 02/10/16, bed tansfer New EC fistula midline 02/13/16/CT shows LLQ intraabdominal abscess CT guided LLQabdominal abscess drainage, 02/14/16; IR Dr. Fredia SorrowYamagata Post op Ventricular tachycardia Tracheostomy Aspiration pneumonia/pleural effusion Malnutrition/deconditioning Anemia  Stage II sacral decubitus Malnutrition formerly on TF/now on TNA FEN: NPO/TNA  ID: Zosyn 02/06/16 thru 02/17/16; Vancomycin 02/08/16 thru 02/15/16;  Rocephin started 02/17/16 =>> day 3 DVT: Heparin/SCD   Plan:  CBC is rising and we plan to get a repeat CT.       LOS: 30 days    Tashayla Therien 02/19/2016 [phone]

## 2016-02-19 NOTE — Progress Notes (Signed)
Physical Therapy Treatment Patient Details Name: [name] MRN: [id number] DOB: January 13, 1941 Today's Date: 02/19/2016    History of Present Illness re admit from [facility] 12/05.Dx N/V r/o ileus. S/P 02/06/16 forEXPLORATORY LAPAROTOMY, LYSIS OF ADHESIONS, SEGMENTAL SMALL BOWEL RESECTION, GASTROJEJUNOSTOMY 12/23    PT Comments    Pt progressing with his mobility.  Amb a greater distance and used RW vs EVA this session.   Follow Up Recommendations  SNF;Supervision/Assistance - 24 hour;Home health PT     Equipment Recommendations       Recommendations for Other Services       Precautions / Restrictions Precautions Precautions: Fall Precaution Comments: recent LAP with mod ABD drainage, 2 drains, 1 bulb     TRACH collar  PCA Restrictions Weight Bearing Restrictions: No    Mobility  Bed Mobility Overal bed mobility: Needs Assistance Bed Mobility: Supine to Sit     Supine to sit: Mod assist;Min assist     General bed mobility comments: assist with upper body and use of bed pad to complete scooting to EOB.  Once upright EOB, pt able to self maintain sitting balance.   Transfers Overall transfer level: Needs assistance Equipment used: None Transfers: Sit to/from Stand Sit to Stand: Min assist;+2 physical assistance;From elevated surface         General transfer comment: 25% VC's for direction and pt able to self rise with proper hand placement  Ambulation/Gait Ambulation/Gait assistance: Min assist;+2 safety/equipment Ambulation Distance (Feet): 150 Feet (50, 50, 50 with sitting rest breaks between) Assistive device: Rolling walker (2 wheeled) (back to using RW vs EVA) Gait Pattern/deviations: Step-through pattern;Decreased stride length;Drifts right/left Gait velocity: decreased   General Gait Details: tolerated increased distance and used RW vs EVA.   Amb on RA sats avg 92% with HR 110.     Stairs            Wheelchair Mobility    Modified Rankin  (Stroke Patients Only)       Balance                                    Cognition Arousal/Alertness: Awake/alert Behavior During Therapy: WFL for tasks assessed/performed Overall Cognitive Status: Within Functional Limits for tasks assessed                 General Comments: in good spirits    Exercises      General Comments        Pertinent Vitals/Pain Pain Assessment: Faces Pain Score: 7  Pain Location: stomach area Pain Descriptors / Indicators: Guarding;Grimacing Pain Intervention(s): Monitored during session;Repositioned;PCA encouraged    Home Living                      Prior Function            PT Goals (current goals can now be found in the care plan section) Progress towards PT goals: Progressing toward goals    Frequency    Min 3X/week      PT Plan Current plan remains appropriate    Co-evaluation             End of Session Equipment Utilized During Treatment: Gait belt Activity Tolerance: Patient tolerated treatment well Patient left: in chair;with call bell/phone within reach;with chair alarm set;with family/visitor present     Time: 1430-1456 PT Time Calculation (min) (ACUTE ONLY): 26 min  Charges:  $  Gait Training: 8-22 mins $Therapeutic Activity: 8-22 mins                    G Codes:      Rica Koyanagi  PTA [facility]  Acute  Rehab Pager      [phone]

## 2016-02-19 NOTE — Progress Notes (Signed)
Referring Physician(s): Martin,M  Supervising Physician: Irish Lack  Patient Status:  [name] - In-pt  Chief Complaint:  LLQ abdominal abscess  Subjective:  Pt without new changes  Allergies: Flomax [tamsulosin hcl]; Lisinopril; Lorazepam; and Uroxatral [alfuzosin hcl er]  Medications: Prior to Admission medications   Medication Sig Start Date End Date Taking? Authorizing Provider  aluminum-magnesium hydroxide-simethicone (MAALOX) 200-200-20 MG/5ML SUSP Take 30 mLs by mouth every 6 (six) hours as needed.   Yes Historical Provider, MD  Amino Ac Elect-Calc in D10W (CLINIMIX E/DEXTROSE, 2.75/10,) 2.75 % SOLN Inject 2,160 mLs into the vein as needed.   Yes Historical Provider, MD  aspirin 81 MG chewable tablet Place 81 mg into feeding tube every evening.   Yes Historical Provider, MD  atorvastatin (LIPITOR) 40 MG tablet Place 40 mg into feeding tube at bedtime.    Yes Historical Provider, MD  bisacodyl (DULCOLAX) 10 MG suppository Place 10 mg rectally daily as needed for moderate constipation or severe constipation.   Yes Historical Provider, MD  bismuth subsalicylate (PEPTO BISMOL) 262 MG/15ML suspension Place 30 mLs into feeding tube every 8 (eight) hours as needed for indigestion or diarrhea or loose stools.   Yes Historical Provider, MD  calcium-vitamin D (OSCAL WITH D) 500-200 MG-UNIT tablet Place 1 tablet into feeding tube daily with breakfast.    Yes Historical Provider, MD  Cyanocobalamin (VITAMIN B-12) 1000 MCG SUBL Place 1 tablet under the tongue daily with breakfast.   Yes Historical Provider, MD  Ferrous Sulfate 220 (44 Fe) MG/5ML LIQD Place 7.5 mLs into feeding tube at bedtime.    Yes Historical Provider, MD  FLUoxetine (PROZAC) 20 MG/5ML solution Place 20 mg into feeding tube daily.   Yes Historical Provider, MD  haloperidol (HALDOL) 0.5 MG tablet Take 0.25-0.5 mg by mouth 3 (three) times daily. At 6 am and 4 PM and 9 PM   Yes Historical Provider, MD  haloperidol  (HALDOL) 0.5 MG tablet Place 0.5 mg into feeding tube as needed for agitation. STOP DATE 01/24/2016 01/10/16 01/24/16 Yes Historical Provider, MD  loperamide (IMODIUM) 1 MG/5ML solution 10 mg. Take 10 mls via tube every 8 hours as needed for more than 2 bms every 8 hours.   Yes Historical Provider, MD  magic mouthwash SOLN Take 15 mLs by mouth 4 (four) times daily as needed for mouth pain (sore throat). 12/30/15  Yes Karie Soda, MD  metoCLOPramide (REGLAN) 5 MG/5ML solution Place 10 mg into feeding tube every 6 (six) hours as needed for nausea or vomiting.   Yes Historical Provider, MD  metoprolol tartrate (LOPRESSOR) 25 MG tablet Place 25 mg into feeding tube daily.   Yes Historical Provider, MD  nicotine (NICODERM CQ - DOSED IN MG/24 HR) 7 mg/24hr patch Place 7 mg onto the skin daily. Rotate site   Yes Historical Provider, MD  nitroGLYCERIN (NITROSTAT) 0.4 MG SL tablet Place 0.4 mg under the tongue every 5 (five) minutes as needed for chest pain.   Yes Historical Provider, MD  Nutritional Supplements (FEEDING SUPPLEMENT, OSMOLITE 1.2 CAL,) LIQD Place 237 mLs into feeding tube. Give 1 can via G-tube every day at  10 am and  6 pm.   Yes Historical Provider, MD  omeprazole (PRILOSEC) 40 MG capsule Take 40 mg by mouth 2 (two) times daily.   Yes Historical Provider, MD  Polyvinyl Alcohol-Povidone (REFRESH OP) Place 2 drops into both eyes as needed (For dry eyes.).    Yes Historical Provider, MD  potassium chloride (KCL) 2  mEq/mL SOLN oral liquid Take 30 mEq by mouth daily.   Yes Historical Provider, MD  RESTASIS 0.05 % ophthalmic emulsion Place 1 drop into both eyes 2 (two) times daily.  04/16/13  Yes Historical Provider, MD  traZODone (DESYREL) 50 MG tablet Place 100 mg into feeding tube at bedtime.    Yes Historical Provider, MD  Water For Irrigation, Sterile (STERILE WATER FOR IRRIGATION) Irrigate with 250 mLs as directed every 6 (six) hours.    Historical Provider, MD     Vital Signs: BP (!)  139/51   Pulse 96   Temp 97.6 F (36.4 C) (Oral)   Resp 15   Ht 6\' 2"  (1.88 m)   Wt 183 lb 13.8 oz (83.4 kg)   SpO2 98%   BMI 23.61 kg/m   Physical Exam LLQ drain intact, insertion site ok, not sig tender, drain irrigated with 5 cc sterile NS with return of same amt  Imaging: No results found.  Labs:  CBC:  Recent Labs  02/16/16 0421 02/17/16 0444 02/18/16 0422 02/19/16 0448  WBC 13.2* 14.5* 14.5* 15.9*  HGB 8.2* 8.5* 8.3* 8.5*  HCT 24.9* 26.1* 25.3* 25.5*  PLT 706* 830* 776* 837*    COAGS:  Recent Labs  12/04/15 2020 12/11/15 0405 12/13/15 1402 02/13/16 1140  INR 1.62 1.25 1.26 1.18  APTT 39*  --  38*  --     BMP:  Recent Labs  02/13/16 0330 02/15/16 0346 02/16/16 0421 02/18/16 0835  NA 134* 134* 134* 133*  K 3.8 3.8 3.6 3.8  CL 103 104 104 104  CO2 25 26 25 24   GLUCOSE 112* 126* 121* 115*  BUN 25* 25* 24* 25*  CALCIUM 8.0* 8.2* 8.3* 8.4*  CREATININE 0.68 0.84 0.63 0.68  GFRNONAA >60 >60 >60 >60  GFRAA >60 >60 >60 >60    LIVER FUNCTION TESTS:  Recent Labs  02/11/16 0323 02/13/16 0330 02/15/16 0346 02/18/16 0835  BILITOT 0.7 0.3 0.3 0.2*  AST 28 30 25 21   ALT 23 32 27 20  ALKPHOS 280* 257* 287* 283*  PROT 5.1* 5.4* 5.6* 6.2*  ALBUMIN 1.2* 1.2* 1.3* 1.4*    Assessment and Plan: S/p ex lap with SBR and now with EC fistula and LLQ abscess, s/p perc drain on 12/31; draining enteric contents; output minimal ; AF; WBC cont to climb gradually, now 15.9(14.5), hgb 8.5 (8.3), creat nl; will plan to recheck CT today or tomorrow   Electronically Signed: D. Jeananne RamaKevin Antavia Tandy 02/19/2016, 9:57 AM   I spent a total of 15 minutes at the the patient's bedside AND on the patient's hospital floor or unit, greater than 50% of which was counseling/coordinating care for abdominal abscess drain    Patient ID: Ardyth Galalph T Show, male   DOB: [birth year], 76 y.o.   MRN: [id number]

## 2016-02-20 LAB — AEROBIC/ANAEROBIC CULTURE (SURGICAL/DEEP WOUND)

## 2016-02-20 LAB — BASIC METABOLIC PANEL
Anion gap: 5 (ref 5–15)
BUN: 27 mg/dL — AB (ref 6–20)
CO2: 26 mmol/L (ref 22–32)
CREATININE: 0.67 mg/dL (ref 0.61–1.24)
Calcium: 8.6 mg/dL — ABNORMAL LOW (ref 8.9–10.3)
Chloride: 104 mmol/L (ref 101–111)
GFR calc Af Amer: >60 mL/min (ref >60)
GLUCOSE: 111 mg/dL — AB (ref 65–99)
Potassium: 3.8 mmol/L (ref 3.5–5.1)
SODIUM: 135 mmol/L (ref 135–145)

## 2016-02-20 LAB — AEROBIC/ANAEROBIC CULTURE W GRAM STAIN (SURGICAL/DEEP WOUND): Special Requests: NORMAL

## 2016-02-20 LAB — GLUCOSE, CAPILLARY
GLUCOSE-CAPILLARY: 84 mg/dL (ref 65–99)
GLUCOSE-CAPILLARY: 87 mg/dL (ref 65–99)
Glucose-Capillary: 93 mg/dL (ref 65–99)

## 2016-02-20 MED ORDER — STERILE WATER FOR INJECTION IJ SOLN
INTRAMUSCULAR | Status: AC
  Filled 2016-02-20: qty 10

## 2016-02-20 MED ORDER — TRACE MINERALS CR-CU-MN-SE-ZN 10-1000-500-60 MCG/ML IV SOLN
INTRAVENOUS | Status: AC
  Administered 2016-02-20: 18:00:00 via INTRAVENOUS
  Filled 2016-02-20 (×2): qty 1988

## 2016-02-20 MED ORDER — FAT EMULSION 20 % IV EMUL
240.0000 mL | INTRAVENOUS | Status: AC
  Administered 2016-02-20: 240 mL via INTRAVENOUS
  Filled 2016-02-20: qty 250

## 2016-02-20 MED ORDER — ALTEPLASE 2 MG IJ SOLR
2.0000 mg | Freq: Once | INTRAMUSCULAR | Status: AC | PRN
Start: 2016-02-20 — End: 2016-02-20
  Administered 2016-02-20: 2 mg
  Filled 2016-02-20: qty 2

## 2016-02-20 NOTE — Progress Notes (Signed)
14 Days Post-Op  Subjective: No real change, Eakin's drainage looks feculent, IR drain is purulent and Gastrotomy is bilious.  He is tolerating everything as stoically as he can.    Objective: Vital signs in last 24 hours: Temp:  [97.8 F (36.6 C)] 97.8 F (36.6 C) (01/06 0506) Pulse Rate:  [81-103] 87 (01/06 0526) Resp:  [10-20] 15 (01/06 0800) BP: (121-132)/(53-63) 121/62 (01/06 0526) SpO2:  [91 %-100 %] 94 % (01/06 0800) FiO2 (%):  [21 %-28 %] 28 % (01/06 0800) Weight:  [80.6 kg (177 lb 11.1 oz)] 80.6 kg (177 lb 11.1 oz) (01/06 0506) Last BM Date: 02/08/16 Urine 1000 Drain 50, G tube 300 Stool -Eakins - 250 Afebrile, VSS WBC is up , anemia is stable Intake/Output from previous day: 01/05 0701 - 01/06 0700 In: 125 [IV Piggyback:105] Out: 1350 [Urine:1000; Drains:350] Intake/Output this shift: No intake/output data recorded.  General appearance: alert, cooperative and no distress Cardio: regular rate and rhythm, S1, S2 normal, no murmur, click, rub or gallop Chest clear, down in bases some ABD:  Soft, + BS, Eakin's, drain, and gastrotomy are about the same. Lab Results:   Recent Labs  02/18/16 0422 02/19/16 0448  WBC 14.5* 15.9*  HGB 8.3* 8.5*  HCT 25.3* 25.5*  PLT 776* 837*    BMET  Recent Labs  02/18/16 0835 02/20/16 0330  NA 133* 135  K 3.8 3.8  CL 104 104  CO2 24 26  GLUCOSE 115* 111*  BUN 25* 27*  CREATININE 0.68 0.67  CALCIUM 8.4* 8.6*   PT/INR No results for input(s): LABPROT, INR in the last 72 hours.   Recent Labs Lab 02/15/16 0346 02/18/16 0835  AST 25 21  ALT 27 20  ALKPHOS 287* 283*  BILITOT 0.3 0.2*  PROT 5.6* 6.2*  ALBUMIN 1.3* 1.4*     Lipase     Component Value Date/Time   LIPASE 55 (H) 01/20/2016 0158     Studies/Results: Ct Abdomen Pelvis W Contrast  Result Date: 02/19/2016 CLINICAL DATA:  Post exploratory laparotomy, lysis of adhesions and segmental small bowel resection 02/06/2016, fish 11 test into abdominal  wall, history hypertension, GERD, coronary artery disease EXAM: CT ABDOMEN AND PELVIS WITH CONTRAST TECHNIQUE: Multidetector CT imaging of the abdomen and pelvis was performed using the standard protocol following bolus administration of intravenous contrast. CONTRAST:  100 mL ISOVUE-300 IOPAMIDOL (ISOVUE-300) INJECTION 61% IV. No oral contrast administered. COMPARISON:  02/14/2016, 02/12/2016 FINDINGS: Lower chest: Bibasilar pleural effusions larger on LEFT. Subsegmental atelectasis BILATERAL lower lobes greater on RIGHT. Hepatobiliary: Subtle irregularity of hepatic margins, cannot completely exclude cirrhosis. Gallbladder surgically absent. No focal hepatic mass lesion. Pancreas: Normal appearance Spleen: Normal appearance Adrenals/Urinary Tract: Adrenal glands normal appearance. Kidneys, ureters, bladder, and prostate gland normal appearance. Stomach/Bowel: Prior gastrojejunostomy. Jejunostomy through gastrostomy tube into proximal small bowel. Dense retained barium within RIGHT colon and rectum, minimally on LEFT. No evidence of bowel obstruction. Scattered sigmoid diverticula. No bowel wall thickening. Vascular/Lymphatic: Atherosclerotic calcifications aorta without aneurysm. Scattered normal sized retroperitoneal nodes. Reproductive: N/A Other: Loculated fluid collection in the anterior upper RIGHT pelvis compatible with abscess, decreased in size since previous exam, now measuring 5.7 x 2.2 x 4.6 cm. Additional loculated fluid collection in the mid RIGHT abdomen question additional abscess, 3.1 x 6.0 x 5.0 cm. Additional tiny gas and fluid collection at the anterior mid abdomen, 4.0 x 1.0 x 2.4 cm, decreased. Small mild fluid within ventral surgical wound. Persistent visualization of a gas and fluid collection adjacent  to the lateral segment LEFT lobe liver. Decreased fluid collection in the mid abdomen and pelvis since prior pigtail drainage catheter placement. No hernia. Small focus of free air in the LEFT  mid abdomen, nonspecific in the setting of a percutaneous drain and G-tube. Musculoskeletal: Osseous structures unremarkable. IMPRESSION: Interval decreases in sizes of loculated fluid collections likely abscesses within the abdomen since previous exam. No new intra- abdominal or intrapelvic abnormalities. Bibasilar effusions and atelectasis as above. Aortic atherosclerosis. Fluid collection identified at the dependent portion of the patient's ventral surgical wound though definite communication to bowel is not established by this study; consider follow-up imaging following GI contrast administration to assess for presence of and extent of any potential fistula. Electronically Signed   By: Ulyses Southward M.D.   On: 02/19/2016 17:06    Medications: . sodium chloride   Intravenous Once  . alteplase  2 mg Intracatheter Once  . cefTRIAXone (ROCEPHIN)  IV  2 g Intravenous Q24H  . chlorhexidine  15 mL Mouth Rinse BID  . cycloSPORINE  1 drop Both Eyes BID  . enoxaparin (LOVENOX) injection  40 mg Subcutaneous Q24H  . insulin aspart  0-9 Units Subcutaneous 4 times per day  . lip balm  1 application Topical BID  . liver oil-zinc oxide   Topical BID  . mouth rinse  15 mL Mouth Rinse q12n4p  . morphine   Intravenous Q4H  . nicotine  7 mg Transdermal Daily  . pantoprazole (PROTONIX) IV  40 mg Intravenous Daily    Assessment/Plan PSBO with 2 days of nausea and vomiting on readmit 01/20/16 prior procedures above S/p EXPLORATORY LAPAROTOMY, LYSIS OF ADHESIONS, SEGMENTAL SMALL BOWEL RESECTION, GASTROJEJUNOSTOMY , 02/06/16, Dr. Chevis Pretty G tube dislodged 02/10/16, bed tansfer New EC fistula midline 02/13/16/CT shows LLQ intraabdominal abscess CT guided LLQabdominal abscess drainage, 02/14/16; IR Dr. Fredia Sorrow Post op Ventricular tachycardia Tracheostomy Aspiration pneumonia/pleural effusion Malnutrition/deconditioning Anemia  Stage II sacral decubitus Malnutrition formerly on TF/now on TNA FEN: NPO/TNA   ID: Zosyn 02/06/16 thru 02/17/16; Vancomycin 02/08/16 thru 02/15/16;  Rocephin started 02/17/16 =>> day 4 DVT: Heparin/SCD   Plan:  Repeat Ct with a couple small abscesses; CT reviewed by phone with Dr. Miles Costain, recommends flushing pigtail drain but no indication to drain RLQ fluid collection. Continue supportive care.        LOS: 31 days    Berna Bue MD 02/20/2016

## 2016-02-20 NOTE — Progress Notes (Signed)
Patient with 7 beat run of vtach. VSS. Patient asymptomatic. NP on call notified. No new orders placed.

## 2016-02-20 NOTE — Progress Notes (Addendum)
PHARMACY - ADULT TOTAL PARENTERAL NUTRITION CONSULT NOTE   Pharmacy Consult for TPN Indication: PTA TPN continuation; bowel perforation  Insulin Requirements:  SSI:  1 unit Novolog sensitive SSI in past 24 hours TPN: 10 units regular insulin per day in TPN   Current Nutrition:  - NPO - TPN to cyclic regimen 1/4, Fat Emulsion 20% at 13 mL/hr over 18 hr  IVF: off  Central access: PICC TPN start date: PTA, restarted on admission on 12/6    Recent Labs  02/18/16 0835 02/20/16 0330  NA 133* 135  K 3.8 3.8  CL 104 104  CO2 24 26  GLUCOSE 115* 111*  BUN 25* 27*  CREATININE 0.68 0.67  CALCIUM 8.4* 8.6*  PHOS 4.0  --   MG 1.8  --   ALBUMIN 1.4*  --   ALKPHOS 283*  --   AST 21  --   ALT 20  --   BILITOT 0.2*  --   Corr Ca: 10.2 on 12/30  ASSESSMENT                                                                                                          HPI: 43 yoM with hx PUD, CAD, GERD, BPH,  s/p multiple procedures  and extended hospitalization for recurrent partial GOO 2/2 pyloric stricture on TPN for several days, discharged 11/15 with J/G tube in place on TFs, unable to tolerate and had to transition back to TPN via [facility] on 11/22, readmitted 12/6 with partial SBO. Pharmacy consulted to resume TPN. Note: PTA TNA from [place] was as follows: Totals per day: 405 grams (1376 kcal)  of dextrose, 75 grams (300 kcal) of protein and 50 grams ( 480 kcal) of lipid for a total of 2156 kcal/day   Significant events:  12/6: TPN continued on admission.  12/7: Pt pulled out NGT, not replaced, remains NPO 12/11: Awaiting MBS, if no aspiration, will start to advance diet.  Having bowel function.  Continue TPN for now.   12/12: Severe aspiration risk per MBS yesterday - recommend continuing NPO. Osmolite 1.2 CAL via TF BID ordered 12/11 at 6pm. Per discussion with CCS PA, continue TPN at current goal rate today, wean tomorrow if tolerates TF. 12/13: Did not tolerate bolus feeds  through G-tube; overnight emesis and aspiration event, started on Zosyn. CCS trying trickle feeds today. 12/14 transition to cyclic TPN - appears recovery may be prolonged. Not tolerating trickle feeds; G-tube to drainage.  12/15: TPN was not run at cyclic rate overnight d/t conflicting information in TPN order. To provide patient full needed nutrition, will continue to run TPN at 83 ml/hr over 24 hr and begin 18-hr cycle with tonight's TPN 12/16: start 12h TPN cycle 12/18: resume trickle tube feed at 5 ml/hr 12/19: Nausea/vomiting, tube feed held. TPN formula changed to E 5/20 from E 5/15 12/21: successful transition of G tube to [place] tube. Plan scan for patency before resuming tube feeds.  12/22: Upper GI scan in IR today to ensure J-tube working.  12/23: exp  lap, lysis of adhesions, segmental small bowel resection, gastrojejunostomy 12/25: TPN converted from cyclic back to continuous due to hypoglycemic event following end of TPN cycle and patient likely to remain hospitalized for at least several more days.  Prealbumin low, falling - Fat Emulsion increased to 10 mL/hr.  Clinimix already at 2L/day. 12/28: to start trickle feeds thru J-tube; G-tube remains on drainage 12/29: TF increased from 10 ml/hr to 20 ml/hr  12/29 PM: TF was found draining from midline incision, CT abdomen c/w bowel perforation at G-J suture site; interval development of abscesses .  TF was stopped and is to remain off until MD orders otherwise. 12/31: Plan percutaneous drainage of largest LLQ fluid collection today in IR.  Because of construction at [facility], procedure will be performed at [facility] and patient will be returned to [facility] post-procedure.  1/2: Drain output 345 ml past 24 hr, changing abd dressing ~ every 4 hr. 1/3: EC Fistula - feculent material from drain placed 12/31 after perc drainage  & in midline wound.  1/4: switch to cyclic TPN  Today, 08/19/8830:   Glucose within goal range (100-150). Max CBG 120   Electrolytes  1/6:  WNL  Renal - SCr WNL, BUN elevated    LFTs:  Alk phos elevated (1/4), unchanged (~2x ULN).  All others < ULN.  TGs (12/24) 134, (1/1) 145  Prealbumin:  15.3 ( 11/13), 10.8 (12/7), 12.8 (12/11), 13.1 (12/18), 8.9 (12/25), 10 (1/1)  Total drains: 638m/24 hr  NUTRITIONAL GOALS                                                                                             RD recs (12/26): Kcal 2000-2200, Protein 100-115g, Fluid [facility]L/[facility] 2156 Kcal/day, Protein 75g/day  Clinimix E 5/20 at a goal rate of 83 ml/hr + 20% fat emulsion at 10 ml/hr to provide: 100 g/day protein, 2240 Kcal/day. (+++There is currently a nPsychologist, prison and probation servicesof Clinimix solution.  Pharmacy will use Clinimix product based on what we have available in stock+++)  PLAN                                                                                   At 1800 today:  Start cycling Clinimix E 5/20 at 50 mls/hr x 1 hour then increase to 118 mls/hr x 16 hours then 50 mls/hr x 1 hour then stop  Cycle  20% fat emulsion over 18 hours  Continue 10 units regular insulin per day in TPN.   TPN to contain standard multivitamins and trace elements.  Enteral feeds to remain off due to bowel leak  Continue sensitive SSI and CBG checks   TPN lab panels on Mondays & Thursdays  Follow clinical course daily.  GMinda DittoPharmD Pager [phone]/07/2016, 9:43 AM

## 2016-02-20 NOTE — Progress Notes (Signed)
Occupational Therapy Treatment Patient Details Name: [name] MRN: [id number] DOB: [birth year] Today's Date: 02/20/2016    [facility]Dx N/V r/o ileus. S/P 02/06/16 forEXPLORATORY LAPAROTOMY, LYSIS OF ADHESIONS, SEGMENTAL SMALL BOWEL RESECTION, GASTROJEJUNOSTOMY 12/23   OT comments  Pt with good participation with increased weights with BUE exercises (3 pounds- but declined OOB)  Follow Up Recommendations  SNF;Home health OT;Supervision/Assistance - 24 hour;CIR;Other (comment) (? if CIR would benefit pt as plan is likely home and pt has a caregiver)          Precautions / Restrictions Precautions Precautions: Fall Precaution Comments: recent LAP with mod ABD drainage, 2 drains, 1 bulb     TRACH collar  PCA Restrictions Weight Bearing Restrictions: No       Mobility Bed Mobility               General bed mobility comments: declined even with encouragement  Transfers                                          Cognition   Behavior During Therapy: WFL for tasks assessed/performed Overall Cognitive Status: Within Functional Limits for tasks assessed                  General Comments: in good spirits- watching [facility]      Exercises General Exercises - Upper Extremity Shoulder Flexion: AROM;10 reps;Both;Other (comment);Strengthening Shoulder ABduction: AROM;Right;Left;Other (comment);Supine Elbow Flexion: AROM;Left;Both;Strengthening;Other (comment);Supine Elbow Extension: AROM;10 reps;Both;Other (comment);Supine Wrist Flexion: AROM;Both;Supine;10 reps Wrist Extension: AROM;Supine;Both;10 reps Digit Composite Flexion: AROM;Both;Supine;10 reps   Shoulder Instructions            Pertinent Vitals/ Pain       Faces Pain Scale: Hurts even more Pain Location: back Pain Descriptors / Indicators: Grimacing Pain Intervention(s): Monitored during session;PCA encouraged;Repositioned          Frequency  Min 3X/week        Progress Toward Goals  OT Goals(current goals can now be found in the care plan section)        Plan Frequency needs to be updated       End of Session     Activity Tolerance Patient tolerated treatment well   Patient Left with call bell/phone within reach;with bed alarm set   Nurse Communication  IV beeping        Time: [id number] OT Time Calculation (min): 17 min  Charges: OT General Charges $OT Visit: 1 Procedure OT Treatments $Therapeutic Exercise: 8-22 mins  Samika Vetsch, Karin GoldenLorraine D 02/20/2016, 2:58 PM

## 2016-02-21 LAB — GLUCOSE, CAPILLARY
GLUCOSE-CAPILLARY: 135 mg/dL — AB (ref 65–99)
GLUCOSE-CAPILLARY: 100 mg/dL — AB (ref 65–99)
Glucose-Capillary: 114 mg/dL — ABNORMAL HIGH (ref 65–99)
Glucose-Capillary: 118 mg/dL — ABNORMAL HIGH (ref 65–99)
Glucose-Capillary: 87 mg/dL (ref 65–99)

## 2016-02-21 MED ORDER — FAT EMULSION 20 % IV EMUL
240.0000 mL | INTRAVENOUS | Status: AC
  Administered 2016-02-21: 240 mL via INTRAVENOUS
  Filled 2016-02-21: qty 250

## 2016-02-21 MED ORDER — INSULIN REGULAR HUMAN 100 UNIT/ML IJ SOLN
INTRAMUSCULAR | Status: AC
  Administered 2016-02-21: 18:00:00 via INTRAVENOUS
  Filled 2016-02-21 (×2): qty 1988

## 2016-02-21 NOTE — Progress Notes (Signed)
15 Days Post-Op  Subjective: No real change, Eakin's drainage looks feculent, IR drain is purulent and Gastrotomy is bilious.  Remains afebrile.    Objective: Vital signs in last 24 hours: Temp:  [97.9 F (36.6 C)-98.5 F (36.9 C)] 97.9 F (36.6 C) (01/07 0600) Pulse Rate:  [88-96] 91 (01/07 0823) Resp:  [10-21] 17 (01/07 0800) BP: (129-139)/(57-59) 129/57 (01/07 0600) SpO2:  [95 %-99 %] 97 % (01/07 0823) FiO2 (%):  [28 %] 28 % (01/07 0427) Weight:  [80.8 kg (178 lb 2.1 oz)] 80.8 kg (178 lb 2.1 oz) (01/07 0700) Last BM Date: 02/08/16 Urine 1000 Drain 15, G tube 950 Stool -Eakins - 100 Afebrile, VSS No labs today.  Intake/Output from previous day: 01/06 0701 - 01/07 0700 In: 1193.5 [I.V.:972; IV Piggyback:215] Out: 1865 [Urine:800; Drains:965] Intake/Output this shift: Total I/O In: 10 [Other:10] Out: -   General appearance: alert, cooperative and no distress Cardio: regular rate and rhythm, S1, S2 normal, no murmur, click, rub or gallop Chest clear, down in bases some ABD:  Soft, + BS, Eakin's, drain, and gastrotomy are about the same. Lab Results:   Recent Labs  02/19/16 0448  WBC 15.9*  HGB 8.5*  HCT 25.5*  PLT 837*    BMET  Recent Labs  02/20/16 0330  NA 135  K 3.8  CL 104  CO2 26  GLUCOSE 111*  BUN 27*  CREATININE 0.67  CALCIUM 8.6*   PT/INR No results for input(s): LABPROT, INR in the last 72 hours.   Recent Labs Lab 02/15/16 0346 02/18/16 0835  AST 25 21  ALT 27 20  ALKPHOS 287* 283*  BILITOT 0.3 0.2*  PROT 5.6* 6.2*  ALBUMIN 1.3* 1.4*     Lipase     Component Value Date/Time   LIPASE 55 (H) 01/20/2016 0158     Studies/Results: Ct Abdomen Pelvis W Contrast  Result Date: 02/19/2016 CLINICAL DATA:  Post exploratory laparotomy, lysis of adhesions and segmental small bowel resection 02/06/2016, fish 11 test into abdominal wall, history hypertension, GERD, coronary artery disease EXAM: CT ABDOMEN AND PELVIS WITH CONTRAST TECHNIQUE:  Multidetector CT imaging of the abdomen and pelvis was performed using the standard protocol following bolus administration of intravenous contrast. CONTRAST:  100 mL ISOVUE-300 IOPAMIDOL (ISOVUE-300) INJECTION 61% IV. No oral contrast administered. COMPARISON:  02/14/2016, 02/12/2016 FINDINGS: Lower chest: Bibasilar pleural effusions larger on LEFT. Subsegmental atelectasis BILATERAL lower lobes greater on RIGHT. Hepatobiliary: Subtle irregularity of hepatic margins, cannot completely exclude cirrhosis. Gallbladder surgically absent. No focal hepatic mass lesion. Pancreas: Normal appearance Spleen: Normal appearance Adrenals/Urinary Tract: Adrenal glands normal appearance. Kidneys, ureters, bladder, and prostate gland normal appearance. Stomach/Bowel: Prior gastrojejunostomy. Jejunostomy through gastrostomy tube into proximal small bowel. Dense retained barium within RIGHT colon and rectum, minimally on LEFT. No evidence of bowel obstruction. Scattered sigmoid diverticula. No bowel wall thickening. Vascular/Lymphatic: Atherosclerotic calcifications aorta without aneurysm. Scattered normal sized retroperitoneal nodes. Reproductive: N/A Other: Loculated fluid collection in the anterior upper RIGHT pelvis compatible with abscess, decreased in size since previous exam, now measuring 5.7 x 2.2 x 4.6 cm. Additional loculated fluid collection in the mid RIGHT abdomen question additional abscess, 3.1 x 6.0 x 5.0 cm. Additional tiny gas and fluid collection at the anterior mid abdomen, 4.0 x 1.0 x 2.4 cm, decreased. Small mild fluid within ventral surgical wound. Persistent visualization of a gas and fluid collection adjacent to the lateral segment LEFT lobe liver. Decreased fluid collection in the mid abdomen and pelvis since prior pigtail  drainage catheter placement. No hernia. Small focus of free air in the LEFT mid abdomen, nonspecific in the setting of a percutaneous drain and G-tube. Musculoskeletal: Osseous  structures unremarkable. IMPRESSION: Interval decreases in sizes of loculated fluid collections likely abscesses within the abdomen since previous exam. No new intra- abdominal or intrapelvic abnormalities. Bibasilar effusions and atelectasis as above. Aortic atherosclerosis. Fluid collection identified at the dependent portion of the patient's ventral surgical wound though definite communication to bowel is not established by this study; consider follow-up imaging following GI contrast administration to assess for presence of and extent of any potential fistula. Electronically Signed   By: Ulyses Southward M.D.   On: 02/19/2016 17:06    Medications: . sodium chloride   Intravenous Once  . alteplase  2 mg Intracatheter Once  . cefTRIAXone (ROCEPHIN)  IV  2 g Intravenous Q24H  . chlorhexidine  15 mL Mouth Rinse BID  . cycloSPORINE  1 drop Both Eyes BID  . enoxaparin (LOVENOX) injection  40 mg Subcutaneous Q24H  . insulin aspart  0-9 Units Subcutaneous 4 times per day  . lip balm  1 application Topical BID  . liver oil-zinc oxide   Topical BID  . mouth rinse  15 mL Mouth Rinse q12n4p  . morphine   Intravenous Q4H  . nicotine  7 mg Transdermal Daily  . pantoprazole (PROTONIX) IV  40 mg Intravenous Daily    Assessment/Plan PSBO with 2 days of nausea and vomiting on readmit 01/20/16 prior procedures above S/p EXPLORATORY LAPAROTOMY, LYSIS OF ADHESIONS, SEGMENTAL SMALL BOWEL RESECTION, GASTROJEJUNOSTOMY , 02/06/16, Dr. Chevis Pretty G tube dislodged 02/10/16, bed tansfer New EC fistula midline 02/13/16/CT shows LLQ intraabdominal abscess CT guided LLQabdominal abscess drainage, 02/14/16; IR Dr. Fredia Sorrow Post op Ventricular tachycardia Tracheostomy Aspiration pneumonia/pleural effusion Malnutrition/deconditioning Anemia  Stage II sacral decubitus Malnutrition formerly on TF/now on TNA FEN: NPO/TNA  ID: Zosyn 02/06/16 thru 02/17/16; Vancomycin 02/08/16 thru 02/15/16;  Rocephin started 02/17/16 =>> day  4 DVT: Heparin/SCD   Plan:  Repeat Ct with a couple small abscesses; CT reviewed by phone with Dr. Miles Costain, recommends flushing pigtail drain but no indication to drain RLQ fluid collection. Continue TPN, monitor fistula output. G tube output was a bit higher than previously, if continues he may need additional fluid replacement. Continue supportive care.        LOS: 32 days    Berna Bue MD 02/21/2016

## 2016-02-21 NOTE — Progress Notes (Signed)
Pharmacy Antibiotic Note  [name] is a 76 y.o. male who is s/p multiple procedures and an extended hospitalization for recurrent partial GOO, readmitted on 01/19/2016 with partial SBO and possible aspiration PNA.  Pharmacy was consulted to resume Vancomycin and Zosyn dosing on 12/25 (POD #2 from exploratory laparotomy, lysis of adhesions, segmental small bowel resection, gastrojejunostomy) as empiric coverage for new leukocytosis.  On 12/29 CT demonstrated bowel leak at G-J suture with development of intra-abdominal fluid collections.    D14 resumption of IV abx D5 Rocephin 2gm q24 Percutaneous drainage of the largest collection per IR 12/31 Now with EC fistula, fecal material noted in drain & midline incision Completed 9 days Vancomycin & Zosyn 12/31 wound infection Cx resulted: abx change to Rocephin  Plan:   Rocephin 2gm q24   Pharmacy will sign off note-writing, no renal adjustment necessary with Rocephin  Height: 6\' 2"  (188 cm) Weight: 178 lb 2.1 oz (80.8 kg) IBW/kg (Calculated) : 82.2  Temp (24hrs), Avg:98.2 F (36.8 C), Min:97.9 F (36.6 C), Max:98.5 F (36.9 C)   Recent Labs Lab 02/15/16 0346 02/16/16 0421 02/17/16 0444 02/18/16 0422 02/18/16 0835 02/19/16 0448 02/20/16 0330  WBC 11.8* 13.2* 14.5* 14.5*  --  15.9*  --   CREATININE 0.84 0.63  --   --  0.68  --  0.67    Estimated Creatinine Clearance: 89.8 mL/min (by C-G formula based on SCr of 0.67 mg/dL).    Allergies  Allergen Reactions  . Flomax [Tamsulosin Hcl] Other (See Comments)    Leg weakness  . Lisinopril Cough  . Lorazepam     "i don't like it"  Prefers Xanax or valium  . Uroxatral [Alfuzosin Hcl Er] Other (See Comments)    Leg weakness   Antimicrobials this admission:  12/6 Cefepime >> 12/6 12/6 Vancomycin >> 12/7, resume 12/25 >> 1/2 12/12 Zosyn >> 12/21, resume 12/25 >> 1/3 1/3 Rocephin >>  Levels/Dosage adjustments: 12/29: VT 25 on vanc 1g q12h ; dosage reduced to 1500mg   q24h  Microbiology results: 12/7 influenza A/B negative  12/23 MRSA PCR: negative 12/31 Abd abscess: K pneumo res to Zosyn, Amp, Unasyn, Ancef                                    E coli res to Amp, Unasyn   Thank you for allowing pharmacy to be a part of this patient's care.  Otho BellowsGreen, [name] L PharmD Pager [phone] 02/21/2016, 10:27 AM

## 2016-02-21 NOTE — Progress Notes (Signed)
PHARMACY - ADULT TOTAL PARENTERAL NUTRITION CONSULT NOTE   Pharmacy Consult for TPN Indication: PTA TPN continuation; bowel perforation  Insulin Requirements:  SSI:  1 unit Novolog sensitive SSI in past 24 hours TPN: 10 units regular insulin per day in TPN   Current Nutrition:  - NPO - TPN to cyclic regimen 1/4, Fat Emulsion 20% at 13 mL/hr over 18 hr  IVF: off  Central access: PICC TPN start date: PTA, restarted on admission on 12/6    Recent Labs  02/20/16 0330  NA 135  K 3.8  CL 104  CO2 26  GLUCOSE 111*  BUN 27*  CREATININE 0.67  CALCIUM 8.6*  Corr Ca: 10.2 on 12/30  ASSESSMENT                                                                                                          HPI: 52 yoM with hx PUD, CAD, GERD, BPH,  s/p multiple procedures  and extended hospitalization for recurrent partial GOO 2/2 pyloric stricture on TPN for several days, discharged 11/15 with J/G tube in place on TFs, unable to tolerate and had to transition back to TPN via [facility] on 11/22, readmitted 12/6 with partial SBO. Pharmacy consulted to resume TPN. Note: PTA TNA from [place] was as follows: Totals per day: 405 grams (1376 kcal)  of dextrose, 75 grams (300 kcal) of protein and 50 grams ( 480 kcal) of lipid for a total of 2156 kcal/day   Significant events:  12/6: TPN continued on admission.  12/7: Pt pulled out NGT, not replaced, remains NPO 12/11: Awaiting MBS, if no aspiration, will start to advance diet.  Having bowel function.  Continue TPN for now.   12/12: Severe aspiration risk per MBS yesterday - recommend continuing NPO. Osmolite 1.2 CAL via TF BID ordered 12/11 at 6pm. Per discussion with CCS PA, continue TPN at current goal rate today, wean tomorrow if tolerates TF. 12/13: Did not tolerate bolus feeds through G-tube; overnight emesis and aspiration event, started on Zosyn. CCS trying trickle feeds today. 12/14 transition to cyclic TPN - appears recovery may be  prolonged. Not tolerating trickle feeds; G-tube to drainage.  12/15: TPN was not run at cyclic rate overnight d/t conflicting information in TPN order. To provide patient full needed nutrition, will continue to run TPN at 83 ml/hr over 24 hr and begin 18-hr cycle with tonight's TPN 12/16: start 12h TPN cycle 12/18: resume trickle tube feed at 5 ml/hr 12/19: Nausea/vomiting, tube feed held. TPN formula changed to E 5/20 from E 5/15 12/21: successful transition of G tube to Osborn tube. Plan scan for patency before resuming tube feeds.  12/22: Upper GI scan in IR today to ensure J-tube working.  12/23: exp lap, lysis of adhesions, segmental small bowel resection, gastrojejunostomy 12/25: TPN converted from cyclic back to continuous due to hypoglycemic event following end of TPN cycle and patient likely to remain hospitalized for at least several more days.  Prealbumin low, falling - Fat Emulsion increased to 10 mL/hr.  Clinimix already  at 2L/day. 12/28: to start trickle feeds thru J-tube; G-tube remains on drainage 12/29: TF increased from 10 ml/hr to 20 ml/hr  12/29 PM: TF was found draining from midline incision, CT abdomen c/w bowel perforation at G-J suture site; interval development of abscesses .  TF was stopped and is to remain off until MD orders otherwise. 12/31: Plan percutaneous drainage of largest LLQ fluid collection today in IR.  Because of construction at [facility], procedure will be performed at [facility] and patient will be returned to [facility] post-procedure.  1/2: Drain output 345 ml past 24 hr, changing abd dressing ~ every 4 hr. 1/3: EC Fistula - feculent material from drain placed 12/31 after perc drainage  & in midline wound.  1/4: switch to cyclic TPN  Today, 10/22/3380:   Glucose within goal range (100-150). Max CBG 135   Electrolytes 1/6:  WNL  Renal - SCr WNL, BUN elevated    LFTs:  Alk phos elevated (1/4), unchanged (~2x ULN).  All others < ULN.  TGs (12/24) 134, (1/1) 145  Prealbumin:   15.3 ( 11/13), 10.8 (12/7), 12.8 (12/11), 13.1 (12/18), 8.9 (12/25), 10 (1/1)  Total drain output (PEG/jejunostomy & JP): 876m/24 hr  NUTRITIONAL GOALS                                                                                             RD recs (12/26): Kcal 2000-2200, Protein 100-115g, Fluid 2L/day AB[facility] LLCTPN: 2156 Kcal/day, Protein 75g/day  Clinimix E 5/20 at a goal rate of 83 ml/hr + 20% fat emulsion at 10 ml/hr to provide: 100 g/day protein, 2240 Kcal/day. (+++There is currently a nPsychologist, prison and probation servicesof Clinimix solution.  Pharmacy will use Clinimix product based on what we have available in stock+++)  PLAN                                                                                   At 1800 today:  Continue cycling Clinimix E 5/20 at 50 mls/hr x 1 hour then increase to 118 mls/hr x 16 hours then 50 mls/hr x 1 hour then stop  Cycle  20% fat emulsion over 18 hours  Continue 10 units regular insulin per day in TPN.   TPN to contain standard multivitamins and trace elements.  Enteral feeds to remain off due to bowel leak  Continue sensitive SSI and CBG checks   TPN lab panels on Mondays & Thursdays  Follow clinical course daily.  GMinda DittoPharmD Pager [phone]/08/2016, 10:17 AM

## 2016-02-22 LAB — COMPREHENSIVE METABOLIC PANEL
ALK PHOS: 249 U/L — AB (ref 38–126)
ALT: 20 U/L (ref 17–63)
AST: 27 U/L (ref 15–41)
Albumin: 1.4 g/dL — ABNORMAL LOW (ref 3.5–5.0)
Anion gap: 7 (ref 5–15)
BUN: 31 mg/dL — AB (ref 6–20)
CALCIUM: 8.6 mg/dL — AB (ref 8.9–10.3)
CO2: 23 mmol/L (ref 22–32)
CREATININE: 0.78 mg/dL (ref 0.61–1.24)
Chloride: 105 mmol/L (ref 101–111)
Glucose, Bld: 107 mg/dL — ABNORMAL HIGH (ref 65–99)
Potassium: 4.1 mmol/L (ref 3.5–5.1)
Sodium: 135 mmol/L (ref 135–145)
Total Bilirubin: <0.1 mg/dL — ABNORMAL LOW (ref 0.3–1.2)
Total Protein: 6.2 g/dL — ABNORMAL LOW (ref 6.5–8.1)

## 2016-02-22 LAB — CBC
HEMATOCRIT: 24.3 % — AB (ref 39.0–52.0)
Hemoglobin: 8 g/dL — ABNORMAL LOW (ref 13.0–17.0)
MCH: 29.7 pg (ref 26.0–34.0)
MCHC: 32.9 g/dL (ref 30.0–36.0)
MCV: 90.3 fL (ref 78.0–100.0)
Platelets: 686 10*3/uL — ABNORMAL HIGH (ref 150–400)
RBC: 2.69 MIL/uL — ABNORMAL LOW (ref 4.22–5.81)
RDW: 15.9 % — AB (ref 11.5–15.5)
WBC: 11.5 10*3/uL — ABNORMAL HIGH (ref 4.0–10.5)

## 2016-02-22 LAB — GLUCOSE, CAPILLARY
GLUCOSE-CAPILLARY: 125 mg/dL — AB (ref 65–99)
Glucose-Capillary: 111 mg/dL — ABNORMAL HIGH (ref 65–99)
Glucose-Capillary: 96 mg/dL (ref 65–99)

## 2016-02-22 LAB — PHOSPHORUS: PHOSPHORUS: 4.2 mg/dL (ref 2.5–4.6)

## 2016-02-22 LAB — DIFFERENTIAL
BASOS ABS: 0.1 10*3/uL (ref 0.0–0.1)
BASOS PCT: 1 %
EOS ABS: 0.2 10*3/uL (ref 0.0–0.7)
EOS PCT: 2 %
Lymphocytes Relative: 32 %
Lymphs Abs: 3.7 10*3/uL (ref 0.7–4.0)
Monocytes Absolute: 1.6 10*3/uL — ABNORMAL HIGH (ref 0.1–1.0)
Monocytes Relative: 14 %
Neutro Abs: 5.9 10*3/uL (ref 1.7–7.7)
Neutrophils Relative %: 51 %

## 2016-02-22 LAB — MAGNESIUM: MAGNESIUM: 1.8 mg/dL (ref 1.7–2.4)

## 2016-02-22 LAB — PREALBUMIN: Prealbumin: 14 mg/dL — ABNORMAL LOW (ref 18–38)

## 2016-02-22 LAB — TRIGLYCERIDES: Triglycerides: 146 mg/dL (ref <150)

## 2016-02-22 MED ORDER — FAT EMULSION 20 % IV EMUL
240.0000 mL | INTRAVENOUS | Status: AC
  Administered 2016-02-22: 240 mL via INTRAVENOUS
  Filled 2016-02-22: qty 250

## 2016-02-22 MED ORDER — TRACE MINERALS CR-CU-MN-SE-ZN 10-1000-500-60 MCG/ML IV SOLN
INTRAVENOUS | Status: AC
  Administered 2016-02-22: 18:00:00 via INTRAVENOUS
  Filled 2016-02-22: qty 2000

## 2016-02-22 NOTE — Progress Notes (Signed)
[facility] Progress Note  16 Days Post-Op  Subjective: Sitting up in chair. Wife at bedside. Pt states he is more tired today. No new complaints.   Objective: Vital signs in last 24 hours: Temp:  [97.9 F (36.6 C)-98.1 F (36.7 C)] 98 F (36.7 C) (01/08 1351) Pulse Rate:  [81-107] 107 (01/08 1351) Resp:  [12-19] 19 (01/08 1351) BP: (114-144)/(52-84) 131/71 (01/08 1351) SpO2:  [92 %-99 %] 92 % (01/08 1351) FiO2 (%):  [28 %] 28 % (01/08 1240) Weight:  [167 lb 1.7 oz (75.8 kg)] 167 lb 1.7 oz (75.8 kg) (01/08 0553) Last BM Date: 02/08/16  Intake/Output from previous day: 01/07 0701 - 01/08 0700 In: 966.5 [I.V.:896.5; IV Piggyback:50] Out: 1506 [Urine:800; Drains:446] Intake/Output this shift: Total I/O In: 0  Out: 525 [Urine:525]  PE: Gen:  Alert, NAD, pleasant, cooperative, sitting up in the chair, appears tired Card:  RRR, no M/G/R heard, 2 + radial pulses bilaterally Pulm:  effort normal Abd: Soft, non distended, +BS, Eakin's pouch in place with feculent looking material in pouch, IR drain with purulent drainage and Gtube with bilious drainage, black looking eschar around Gtube. .   Skin: no rashes noted, warm and dry, pale  Lab Results:   Recent Labs  02/22/16 0451  WBC 11.5*  HGB 8.0*  HCT 24.3*  PLT 686*   BMET  Recent Labs  02/20/16 0330 02/22/16 0451  NA 135 135  K 3.8 4.1  CL 104 105  CO2 26 23  GLUCOSE 111* 107*  BUN 27* 31*  CREATININE 0.67 0.78  CALCIUM 8.6* 8.6*   PT/INR No results for input(s): LABPROT, INR in the last 72 hours. CMP     Component Value Date/Time   NA 135 02/22/2016 0451   NA 139 01/14/2016   K 4.1 02/22/2016 0451   CL 105 02/22/2016 0451   CO2 23 02/22/2016 0451   GLUCOSE 107 (H) 02/22/2016 0451   BUN 31 (H) 02/22/2016 0451   BUN 23 (A) 01/14/2016   CREATININE 0.78 02/22/2016 0451   CALCIUM 8.6 (L) 02/22/2016 0451   PROT 6.2 (L) 02/22/2016 0451   ALBUMIN 1.4 (L) 02/22/2016 0451   AST 27 02/22/2016  0451   ALT 20 02/22/2016 0451   ALKPHOS 249 (H) 02/22/2016 0451   BILITOT <0.1 (L) 02/22/2016 0451   GFRNONAA >60 02/22/2016 0451   GFRAA >60 02/22/2016 0451   Lipase     Component Value Date/Time   LIPASE 55 (H) 01/20/2016 0158       Studies/Results: No results found.  Anti-infectives: Anti-infectives    Start     Dose/Rate Route Frequency Ordered Stop   02/17/16 2000  cefTRIAXone (ROCEPHIN) 2 g in dextrose 5 % 50 mL IVPB     2 g 100 mL/hr over 30 Minutes Intravenous Every 24 hours 02/17/16 1434     02/12/16 2200  vancomycin (VANCOCIN) 1,500 mg in sodium chloride 0.9 % 500 mL IVPB  Status:  Discontinued     1,500 mg 250 mL/hr over 120 Minutes Intravenous Every 24 hours 02/12/16 1618 02/16/16 1340   02/09/16 0200  vancomycin (VANCOCIN) IVPB 1000 mg/200 mL premix  Status:  Discontinued     1,000 mg 200 mL/hr over 60 Minutes Intravenous Every 12 hours 02/08/16 1300 02/12/16 1359   02/08/16 1400  vancomycin (VANCOCIN) IVPB 1000 mg/200 mL premix  Status:  Discontinued     1,000 mg 200 mL/hr over 60 Minutes Intravenous Every 12 hours 02/08/16 1259 02/08/16 1300  02/08/16 1300  vancomycin (VANCOCIN) IVPB 1000 mg/200 mL premix     1,000 mg 200 mL/hr over 60 Minutes Intravenous  Once 02/08/16 1217 02/08/16 1336   02/08/16 1200  piperacillin-tazobactam (ZOSYN) IVPB 3.375 g  Status:  Discontinued     3.375 g 12.5 mL/hr over 240 Minutes Intravenous Every 8 hours 02/08/16 1136 02/17/16 1433   02/06/16 1415  piperacillin-tazobactam (ZOSYN) IVPB 3.375 g     3.375 g 100 mL/hr over 30 Minutes Intravenous  Once 02/06/16 1413 02/06/16 1409   01/26/16 2300  piperacillin-tazobactam (ZOSYN) IVPB 3.375 g  Status:  Discontinued     3.375 g 12.5 mL/hr over 240 Minutes Intravenous Every 8 hours 01/26/16 2256 02/04/16 0905   01/26/16 2230  vancomycin (VANCOCIN) IVPB 1000 mg/200 mL premix  Status:  Discontinued     1,000 mg 200 mL/hr over 60 Minutes Intravenous Every 12 hours 01/26/16 2227  01/26/16 2227   01/20/16 0400  vancomycin (VANCOCIN) IVPB 1000 mg/200 mL premix  Status:  Discontinued     1,000 mg 200 mL/hr over 60 Minutes Intravenous Every 12 hours 01/20/16 0323 01/21/16 0900   01/20/16 0330  ceFEPIme (MAXIPIME) 1 g in dextrose 5 % 50 mL IVPB     1 g 100 mL/hr over 30 Minutes Intravenous  Once 01/20/16 0317 01/20/16 0735       Assessment/Plan  PSBO with 2 days of nausea and vomiting on readmit 01/20/16 prior procedures above S/p EXPLORATORY LAPAROTOMY, LYSIS OF ADHESIONS, SEGMENTAL SMALL BOWEL RESECTION, GASTROJEJUNOSTOMY , 02/06/16, Dr. Chevis Pretty G tube dislodged 02/10/16, bed tansfer New EC fistula midline 02/13/16/CT shows LLQ intraabdominal abscess CT guided LLQabdominal abscess drainage, 02/14/16; IR Dr. Fredia Sorrow Post op Ventricular tachycardia Tracheostomy Aspiration pneumonia/pleural effusion Malnutrition/deconditioning Anemia  Stage II sacral decubitus Malnutrition formerly on TF/now on TNA  FEN: NPO/TNA  ID: Zosyn 02/06/16 thru 02/17/16; Vancomycin 02/08/16 thru 02/15/16; Rocephin started 02/17/16 =>>day 5 DVT: Heparin/SCD   Plan:  Continue TPN, monitor fistula output. G tube output was less than day before. Continue supportive care.  WBC trending down. Hg down to 8 from 8.5 yesterday. AM labs.   Appreciated IR's recommendations: no additional drains needed at this time; collections smaller in size; cont current tx; will need LLQ drain injection before removal contemplated.   LOS: 33 days    Jerre Simon , [facility] 02/22/2016, 2:15 PM Pager: [phone] Consults: [phone] Mon-Fri 7:00 am-4:30 pm Sat-Sun 7:00 am-11:30 am

## 2016-02-22 NOTE — Progress Notes (Signed)
Occupational Therapy Treatment Patient Details Name: Danny GalRalph T Arnell MRN: [id number] DOB: [birth year] Today's Date: 02/22/2016    History of present illness [facility]Dx N/V r/o ileus. S/P 02/06/16 forEXPLORATORY LAPAROTOMY, LYSIS OF ADHESIONS, SEGMENTAL SMALL BOWEL RESECTION, GASTROJEJUNOSTOMY 12/23   OT comments  Pt  Performing all exercises with 3 pound weights this day in sitting!  Follow Up Recommendations  SNF;Home health OT;Supervision/Assistance - 24 hour;CIR;Other (comment)          Precautions / Restrictions Precautions Precautions: Fall Precaution Comments: recent LAP with mod ABD drainage, 1 drains, 1 bulb     TRACH collar  PCA Restrictions Weight Bearing Restrictions: No              ADL Overall ADL's : Needs assistance/impaired                                       General ADL Comments: pt sitting up in chair! Pt wanted to go back to bed but OT encouraged pt to stay sitting up and listen to a CD. Pt agreed after performing BUE exercise with weights         Perception     Praxis      Cognition   Behavior During Therapy: WFL for tasks assessed/performed Overall Cognitive Status: Within Functional Limits for tasks assessed                       Extremity/Trunk Assessment               Exercises General Exercises - Upper Extremity Shoulder Flexion: AROM;10 reps;Both;Other (comment);Strengthening Shoulder ABduction: AROM;Right;Left;Other (comment);Strengthening;Seated Shoulder ADduction: Both;AROM;Seated;Strengthening Shoulder Horizontal ABduction: AROM;Strengthening;10 reps;Both;Seated Shoulder Horizontal ADduction: AROM;Strengthening;10 reps;Both;Seated Elbow Flexion: AROM;Left;Both;Strengthening;Other (comment);Supine;Seated Elbow Extension: AROM;10 reps;Both;Other (comment);Strengthening;Seated Wrist Flexion: AROM;Both;10 reps;Strengthening;Seated Wrist Extension: AROM;Both;10  reps;Strengthening;Seated Digit Composite Flexion: AROM;Both;10 reps;Strengthening;Seated   Shoulder Instructions       General Comments              Frequency  Min 3X/week        Progress Toward Goals  OT Goals(current goals can now be found in the care plan section)  Progress towards OT goals: Progressing toward goals     Plan Discharge plan remains appropriate    Co-evaluation                 End of Session     Activity Tolerance Patient tolerated treatment well   Patient Left in chair;with call bell/phone within reach;with family/visitor present   Nurse Communication          Time: [id number] OT Time Calculation (min): 22 min  Charges: OT General Charges $OT Visit: 1 Procedure OT Treatments $Therapeutic Exercise: 8-22 mins  Katlynn Naser D 02/22/2016, 5:54 PM

## 2016-02-22 NOTE — Progress Notes (Signed)
Wasted 3 ml of remaining MS PCA syringe down sink with Sumner BoastIfeoma Oraegbunam, RN. New syringe started.

## 2016-02-22 NOTE — Progress Notes (Signed)
PHARMACY - ADULT TOTAL PARENTERAL NUTRITION CONSULT NOTE   Pharmacy Consult for TPN Indication: PTA TPN continuation; bowel perforation  Insulin Requirements:  SSI:  0 units Novolog sensitive SSI in past 24 hours TPN: 10 units regular insulin per day in TPN   Current Nutrition:  - NPO - cyclic TPN  IVF:  none  Central access: PICC TPN start date: PTA, restarted on admission on 12/6    Recent Labs  02/20/16 0330 02/22/16 0451  NA 135 135  K 3.8 4.1  CL 104 105  CO2 26 23  GLUCOSE 111* 107*  BUN 27* 31*  CREATININE 0.67 0.78  CALCIUM 8.6* 8.6*  PHOS  --  4.2  MG  --  1.8  ALBUMIN  --  1.4*  ALKPHOS  --  249*  AST  --  27  ALT  --  20  BILITOT  --  <0.1*  Corr Ca: 10.2 on 12/30  ASSESSMENT                                                                                                          HPI: 17 yoM with hx PUD, CAD, GERD, BPH,  s/p multiple procedures  and extended hospitalization for recurrent partial GOO 2/2 pyloric stricture on TPN for several days, discharged 11/15 with J/G tube in place on TFs, unable to tolerate and had to transition back to TPN via [facility] on 11/22, readmitted 12/6 with partial SBO. Pharmacy consulted to resume TPN. Note: PTA TNA from [place] was as follows: Totals per day: 405 grams (1376 kcal)  of dextrose, 75 grams (300 kcal) of protein and 50 grams ( 480 kcal) of lipid for a total of 2156 kcal/day   Significant events:  12/6: TPN continued on admission.  12/7: Pt pulled out NGT, not replaced, remains NPO 12/11: Awaiting MBS, if no aspiration, will start to advance diet.  Having bowel function.  Continue TPN for now.   12/12: Severe aspiration risk per MBS yesterday - recommend continuing NPO. Osmolite 1.2 CAL via TF BID ordered 12/11 at 6pm. Per discussion with CCS PA, continue TPN at current goal rate today, wean tomorrow if tolerates TF. 12/13: Did not tolerate bolus feeds through G-tube; overnight emesis and aspiration event,  started on Zosyn. CCS trying trickle feeds today. 12/14 transition to cyclic TPN - appears recovery may be prolonged. Not tolerating trickle feeds; G-tube to drainage.  12/15: TPN was not run at cyclic rate overnight d/t conflicting information in TPN order. To provide patient full needed nutrition, will continue to run TPN at 83 ml/hr over 24 hr and begin 18-hr cycle with tonight's TPN 12/16: start 12h TPN cycle 12/18: resume trickle tube feed at 5 ml/hr 12/19: Nausea/vomiting, tube feed held. TPN formula changed to E 5/20 from E 5/15 12/21: successful transition of G tube to [place] tube. Plan scan for patency before resuming tube feeds.  12/22: Upper GI scan in IR today to ensure J-tube working.  12/23: exp lap, lysis of adhesions, segmental small bowel resection, gastrojejunostomy 12/25: TPN  converted from cyclic back to continuous due to hypoglycemic event following end of TPN cycle and patient likely to remain hospitalized for at least several more days.  Prealbumin low, falling - Fat Emulsion increased to 10 mL/hr.  Clinimix already at 2L/day. 12/28: to start trickle feeds thru J-tube; G-tube remains on drainage 12/29: TF increased from 10 ml/hr to 20 ml/hr  12/29 PM: TF was found draining from midline incision, CT abdomen c/w bowel perforation at G-J suture site; interval development of abscesses.  TF was stopped and is to remain off until MD orders otherwise. 12/31: percutaneous drainage of LLQ fluid collection. 1/3: EC Fistula - feculent material from drain placed 12/31 after perc drainage  & in midline wound.  1/4: Changed to cyclic TPN over 18 hours 1/8: cycle TPN over 16 hours  Today, 02/22/2016:   Glucose within goal (100-150), range 87-135  Electrolytes:  WNL  Renal - SCr WNL, BUN elevated    LFTs:  Alk phos elevated but decreased (1/8).  All others < ULN.  TGs (12/24) 134, (1/1) 145, 146 (1/8)  Prealbumin:  15.3 ( 11/13), 10.8 (12/7), 12.8 (12/11), 13.1 (12/18), 8.9 (12/25), 10  (1/1), 14 (1/8)  Total drain output (PEG/jejunostomy & JP): 425 ml/24 hr  Noted weight decreased to 75.8 kg (admission weight ~80 kg)  NUTRITIONAL GOALS                                                                                             RD recs (12/26): Kcal 2000-2200, Protein 100-115g, Fluid 2L/day [facility] TPN: 2156 Kcal/day, Protein 75g/day  Clinimix E 5/20 2000 ml/day + 20% fat emulsion 240 ml/day: 100 g/day protein, 2240 Kcal/day. (+++There is currently a Psychologist, prison and probation services of Clinimix solution.  Pharmacy will use Clinimix product based on what we have available in stock+++)  PLAN                                                                                   At 1800 today:  Increase rate to cycle TPN over 16 hours: Clinimix E 5/20 at 50 mls/hr x 1 hour then increase to 135 mls/hr x 14 hours then 50 mls/hr x 1 hour then stop.  Cycle  20% fat emulsion 226m over 16 hours.  Continue 10 units regular insulin per day in TPN.   Continue standard multivitamins and trace elements in TPN.  Continue sensitive SSI and CBG checks q6h.  TPN lab panels on Mondays & Thursdays  Follow clinical course daily.  CGretta ArabPharmD, BCPS Pager [phone]/09/2016 7:33 AM

## 2016-02-22 NOTE — Progress Notes (Signed)
Referring Physician(s): Martin,M  Supervising Physician: Simonne ComeWatts, John  [facility]  Chief Complaint:  LLQ abscess  Subjective: Pt sitting up in chair; no acute changes   Allergies: Flomax [tamsulosin hcl]; Lisinopril; Lorazepam; and Uroxatral [alfuzosin hcl er]  Medications: Prior to Admission medications   Medication Sig Start Date End Date Taking? Authorizing Provider  aluminum-magnesium hydroxide-simethicone (MAALOX) 200-200-20 MG/5ML SUSP Take 30 mLs by mouth every 6 (six) hours as needed.   Yes Historical Provider, MD  Amino Ac Elect-Calc in D10W (CLINIMIX E/DEXTROSE, 2.75/10,) 2.75 % SOLN Inject 2,160 mLs into the vein as needed.   Yes Historical Provider, MD  aspirin 81 MG chewable tablet Place 81 mg into feeding tube every evening.   Yes Historical Provider, MD  atorvastatin (LIPITOR) 40 MG tablet Place 40 mg into feeding tube at bedtime.    Yes Historical Provider, MD  bisacodyl (DULCOLAX) 10 MG suppository Place 10 mg rectally daily as needed for moderate constipation or severe constipation.   Yes Historical Provider, MD  bismuth subsalicylate (PEPTO BISMOL) 262 MG/15ML suspension Place 30 mLs into feeding tube every 8 (eight) hours as needed for indigestion or diarrhea or loose stools.   Yes Historical Provider, MD  calcium-vitamin D (OSCAL WITH D) 500-200 MG-UNIT tablet Place 1 tablet into feeding tube daily with breakfast.    Yes Historical Provider, MD  Cyanocobalamin (VITAMIN B-12) 1000 MCG SUBL Place 1 tablet under the tongue daily with breakfast.   Yes Historical Provider, MD  Ferrous Sulfate 220 (44 Fe) MG/5ML LIQD Place 7.5 mLs into feeding tube at bedtime.    Yes Historical Provider, MD  FLUoxetine (PROZAC) 20 MG/5ML solution Place 20 mg into feeding tube daily.   Yes Historical Provider, MD  haloperidol (HALDOL) 0.5 MG tablet Take 0.25-0.5 mg by mouth 3 (three) times daily. At 6 am and 4 PM and 9 PM   Yes Historical Provider, MD  haloperidol  (HALDOL) 0.5 MG tablet Place 0.5 mg into feeding tube as needed for agitation. STOP DATE 01/24/2016 01/10/16 01/24/16 Yes Historical Provider, MD  loperamide (IMODIUM) 1 MG/5ML solution 10 mg. Take 10 mls via tube every 8 hours as needed for more than 2 bms every 8 hours.   Yes Historical Provider, MD  magic mouthwash SOLN Take 15 mLs by mouth 4 (four) times daily as needed for mouth pain (sore throat). 12/30/15  Yes Karie SodaSteven Gross, MD  metoCLOPramide (REGLAN) 5 MG/5ML solution Place 10 mg into feeding tube every 6 (six) hours as needed for nausea or vomiting.   Yes Historical Provider, MD  metoprolol tartrate (LOPRESSOR) 25 MG tablet Place 25 mg into feeding tube daily.   Yes Historical Provider, MD  nicotine (NICODERM CQ - DOSED IN MG/24 HR) 7 mg/24hr patch Place 7 mg onto the skin daily. Rotate site   Yes Historical Provider, MD  nitroGLYCERIN (NITROSTAT) 0.4 MG SL tablet Place 0.4 mg under the tongue every 5 (five) minutes as needed for chest pain.   Yes Historical Provider, MD  Nutritional Supplements (FEEDING SUPPLEMENT, OSMOLITE 1.2 CAL,) LIQD Place 237 mLs into feeding tube. Give 1 can via G-tube every day at  10 am and  6 pm.   Yes Historical Provider, MD  omeprazole (PRILOSEC) 40 MG capsule Take 40 mg by mouth 2 (two) times daily.   Yes Historical Provider, MD  Polyvinyl Alcohol-Povidone (REFRESH OP) Place 2 drops into both eyes as needed (For dry eyes.).    Yes Historical Provider, MD  potassium  chloride (KCL) 2 mEq/mL SOLN oral liquid Take 30 mEq by mouth daily.   Yes Historical Provider, MD  RESTASIS 0.05 % ophthalmic emulsion Place 1 drop into both eyes 2 (two) times daily.  04/16/13  Yes Historical Provider, MD  traZODone (DESYREL) 50 MG tablet Place 100 mg into feeding tube at bedtime.    Yes Historical Provider, MD  Water For Irrigation, Sterile (STERILE WATER FOR IRRIGATION) Irrigate with 250 mLs as directed every 6 (six) hours.    Historical Provider, MD     Vital Signs: BP 131/71  (BP Location: Left Arm)   Pulse (!) 107   Temp 98 F (36.7 C) (Oral)   Resp 19   Ht 6\' 2"  (1.88 m)   Wt 167 lb 1.7 oz (75.8 kg)   SpO2 92%   BMI 21.46 kg/m   Physical Exam LLQ drain intact, output 20 cc beige colored fluid; insertion site ok; drain irrigated with 5 cc sterile NS without difficulty  Imaging: Ct Abdomen Pelvis W Contrast  Result Date: 02/19/2016 CLINICAL DATA:  Post exploratory laparotomy, lysis of adhesions and segmental small bowel resection 02/06/2016, fish 11 test into abdominal wall, history hypertension, GERD, coronary artery disease EXAM: CT ABDOMEN AND PELVIS WITH CONTRAST TECHNIQUE: Multidetector CT imaging of the abdomen and pelvis was performed using the standard protocol following bolus administration of intravenous contrast. CONTRAST:  100 mL ISOVUE-300 IOPAMIDOL (ISOVUE-300) INJECTION 61% IV. No oral contrast administered. COMPARISON:  02/14/2016, 02/12/2016 FINDINGS: Lower chest: Bibasilar pleural effusions larger on LEFT. Subsegmental atelectasis BILATERAL lower lobes greater on RIGHT. Hepatobiliary: Subtle irregularity of hepatic margins, cannot completely exclude cirrhosis. Gallbladder surgically absent. No focal hepatic mass lesion. Pancreas: Normal appearance Spleen: Normal appearance Adrenals/Urinary Tract: Adrenal glands normal appearance. Kidneys, ureters, bladder, and prostate gland normal appearance. Stomach/Bowel: Prior gastrojejunostomy. Jejunostomy through gastrostomy tube into proximal small bowel. Dense retained barium within RIGHT colon and rectum, minimally on LEFT. No evidence of bowel obstruction. Scattered sigmoid diverticula. No bowel wall thickening. Vascular/Lymphatic: Atherosclerotic calcifications aorta without aneurysm. Scattered normal sized retroperitoneal nodes. Reproductive: N/A Other: Loculated fluid collection in the anterior upper RIGHT pelvis compatible with abscess, decreased in size since previous exam, now measuring 5.7 x 2.2 x 4.6 cm.  Additional loculated fluid collection in the mid RIGHT abdomen question additional abscess, 3.1 x 6.0 x 5.0 cm. Additional tiny gas and fluid collection at the anterior mid abdomen, 4.0 x 1.0 x 2.4 cm, decreased. Small mild fluid within ventral surgical wound. Persistent visualization of a gas and fluid collection adjacent to the lateral segment LEFT lobe liver. Decreased fluid collection in the mid abdomen and pelvis since prior pigtail drainage catheter placement. No hernia. Small focus of free air in the LEFT mid abdomen, nonspecific in the setting of a percutaneous drain and G-tube. Musculoskeletal: Osseous structures unremarkable. IMPRESSION: Interval decreases in sizes of loculated fluid collections likely abscesses within the abdomen since previous exam. No new intra- abdominal or intrapelvic abnormalities. Bibasilar effusions and atelectasis as above. Aortic atherosclerosis. Fluid collection identified at the dependent portion of the patient's ventral surgical wound though definite communication to bowel is not established by this study; consider follow-up imaging following GI contrast administration to assess for presence of and extent of any potential fistula. Electronically Signed   By: Ulyses Southward M.D.   On: 02/19/2016 17:06    Labs:  CBC:  Recent Labs  02/17/16 0444 02/18/16 0422 02/19/16 0448 02/22/16 0451  WBC 14.5* 14.5* 15.9* 11.5*  HGB 8.5* 8.3* 8.5* 8.0*  HCT 26.1* 25.3* 25.5* 24.3*  PLT 830* 776* 837* 686*    COAGS:  Recent Labs  12/04/15 2020 12/11/15 0405 12/13/15 1402 02/13/16 1140  INR 1.62 1.25 1.26 1.18  APTT 39*  --  38*  --     BMP:  Recent Labs  02/16/16 0421 02/18/16 0835 02/20/16 0330 02/22/16 0451  NA 134* 133* 135 135  K 3.6 3.8 3.8 4.1  CL 104 104 104 105  CO2 25 24 26 23   GLUCOSE 121* 115* 111* 107*  BUN 24* 25* 27* 31*  CALCIUM 8.3* 8.4* 8.6* 8.6*  CREATININE 0.63 0.68 0.67 0.78  GFRNONAA >60 >60 >60 >60  GFRAA >60 >60 >60 >60     LIVER FUNCTION TESTS:  Recent Labs  02/13/16 0330 02/15/16 0346 02/18/16 0835 02/22/16 0451  BILITOT 0.3 0.3 0.2* <0.1*  AST 30 25 21 27   ALT 32 27 20 20   ALKPHOS 257* 287* 283* 249*  PROT 5.4* 5.6* 6.2* 6.2*  ALBUMIN 1.2* 1.3* 1.4* 1.4*    Assessment and Plan: S/p ex lap with SBR and now with EC fistula and LLQ abscess, s/p perc drain on 12/31; f/u CT reviewed, no additional drains needed at this time; collections smaller in size; AF; WBC down to 11.5(15.9), hgb 8.0, creat nl; cont current tx; will need LLQ drain injection before removal contemplated.   Electronically Signed: D. Jeananne Rama 02/22/2016, 2:11 PM   I spent a total of 15 minutes at the the patient's bedside AND on the patient's hospital floor or unit, greater than 50% of which was counseling/coordinating care for abdominal abscess drain    Patient ID: [name], male   DOB: 10-06-1940, 76 y.o.   MRN: [id number]

## 2016-02-22 NOTE — Progress Notes (Signed)
Physical Therapy Treatment Patient Details Name: [name] MRN: [id number] DOB: [birth year] Today's Date: 02/22/2016    History of Present Illness  [name]    PT Comments    Assisted OOB to recliner then performed some B LE TE's.  Pt declined amb today and became more agitated when asked again, so I didn't push it.    Follow Up Recommendations  SNF;Supervision/Assistance - 24 hour;Home health PT     Equipment Recommendations       Recommendations for Other Services       Precautions / Restrictions Precautions Precautions: Fall Precaution Comments: recent LAP with mod ABD drainage, 1 drains, 1 bulb     TRACH collar  PCA Restrictions Weight Bearing Restrictions: No    Mobility  Bed Mobility Overal bed mobility: Needs Assistance Bed Mobility: Supine to Sit     Supine to sit: Min assist     General bed mobility comments: increased time and assist to complete self scooting to EOB  Transfers Overall transfer level: Needs assistance Equipment used: Rolling walker (2 wheeled) Transfers: Sit to/from [employer] Sit to Stand: Min assist;+2 physical assistance;From elevated surface Stand pivot transfers: +2 safety/equipment;+2 physical assistance;Mod assist       General transfer comment: 25% VC's for direction and pt able to self rise with proper hand placement  Ambulation/Gait Ambulation/Gait assistance: Min assist;+2 safety/equipment Ambulation Distance (Feet): 2 Feet Assistive device: Rolling walker (2 wheeled)   Gait velocity: decreased   General Gait Details: pt declined amb and became more agitated when asked again so didn't push it.  did agree to take a few steps to recliner.     Stairs            Wheelchair Mobility    Modified Rankin (Stroke Patients Only)       Balance                                    Cognition Arousal/Alertness: Awake/alert Behavior During Therapy: WFL for tasks  assessed/performed Overall Cognitive Status: Within Functional Limits for tasks assessed                      Exercises  B LE LAQ's X 10 with 5 sec hold  B LE knee ups X 10  B LE pillow squeeze X 10 with 5 sec hold  B LE hip ABD with light resistance X 10  B LE marching X 20    General Comments        Pertinent Vitals/Pain Pain Assessment: Faces Faces Pain Scale: Hurts little more Pain Location: L ABD LQ Pain Descriptors / Indicators: Grimacing Pain Intervention(s): Monitored during session;Repositioned;PCA encouraged    Home Living                      Prior Function            PT Goals (current goals can now be found in the care plan section) Progress towards PT goals: Progressing toward goals    Frequency    Min 3X/week      PT Plan Current plan remains appropriate    Co-evaluation             End of Session Equipment Utilized During Treatment: Gait belt Activity Tolerance: Patient tolerated treatment well Patient left: in chair;with chair alarm set;with family/visitor present     Time:  [id number] PT Time Calculation (min) (ACUTE ONLY): 19 min  Charges:  $Gait Training: 8-22 mins                    G Codes:      Danny ShellingLori Marchell [name]  PTA [facility]  Acute  Rehab Pager      [phone]

## 2016-02-22 NOTE — Progress Notes (Signed)
Nutrition Follow-up  DOCUMENTATION CODES:   Severe malnutrition in context of acute illness/injury  INTERVENTION:  - Continue cyclic TPN per Pharmacy. - RD will follow-up 1/11.  NUTRITION DIAGNOSIS:   Inadequate oral intake related to inability to eat as evidenced by NPO status. -ongoing  GOAL:   Patient will meet greater than or equal to 90% of their needs -met with TPN regimen.   MONITOR:   Labs, Weight trends, Skin, I & O's, Other (Comment) (TPN, TF initiation?)  REASON FOR ASSESSMENT:   Consult  (continuing TPN)  ASSESSMENT:   76 year-old male with a medical history of GERD, PUD, and CAD who presented to [facility] ED from [facility] via EMS with a cc of nausea, vomiting, and diarrhea. Patient is s/p Robotic anterior and posterior vagotomy, BI anastomosis, DOR fundoplication, omentopexy 09/2015 for partial GOO secondary to pyloric stricture, followed by placement of feeding jejunostomy and gastrostomy tube and EGD balloon dilation of duodenal stricture 12/01/15 for recurrent GOO secondary to edema at anastomosis and failure to thrive with malnutrition. On 12/03/15 the patient was taken to the OR for a diagnostic laparoscopy, omentopexy of jejunal disruption, and washout for jejunal disruption (pt pulled out jejunostomy tube) and gastric leak.   1/8 Pt remains NPO. Consult received outlined above. Cyclic TPN to continue and remain the same as was documented by this RD on 02/19/16. Weight has been trending down since that time and is now -7.6 kg from 02/19/16.   Per Dr. Ron Parker note yesterday at [phone]: Eakin's drainage looks feculent, IR drain is purulent and Gastrotomy is bilious. Repeat Ct with a couple small abscesses. Continue TPN, monitor fistula output. G tube output was a bit higher than previously, if continues he may need additional fluid replacement.   Medications reviewed; sliding scale Novolog, PRN Zofran, 40 mg IV Protonix/day, PRN IV Compazine. Labs  reviewed; CBGs: 125 mg/dL, BUN: 31 mg/dL, Ca: 8.6 mg/dL, Alk Phos elevated, triglycerides WDL (146 mg/dL) today.     1/5 - Pt transitioned to cyclic TPN yesterday.  - Plan per pharmacy outlined below.  - Pt will receive 1988 mL Clinimix E 5/20 + 234 mL 20% ILE per day. This will provide 2218 kcal and 99 grams of protein/day.  - Weight stable from 1/2-1/4 but now +3.4 kg from yesterday.   PLAN At 1800 today:  Start cycling Clinimix E 5/20 at 50 mls/hr x 1 hour then increase to 118 mls/hr x 16 hours then 50 mls/hr x 1 hour then stop  Cycle20% fat emulsion @ over 18 hours  Continue 10 units regular insulin per day in TPN.   TPN to contain standard multivitamins and trace elements.  Enteral feeds to remain off due to bowel leak  Adjust sensitive SSI and CBG checks   TPNlab panels on Mondays &Thursdays  BMET tomorrow   1/2 - Pt remains on continuous TPN: Clinimix E 5/20 @ 83 mL/hr with 20% ILE @ 10 mL/hr which is providing 2240 kcal and 100 grams of protein to meet 100% estimated nutrition needs. - On 12/31 pt was transferred to [facility] for percutaneous drainage of abdominal abscess an 12 French drain was placed at that time.  - Weight has fluctuated frequently.  - Most recently, weight -4.7 kg over 5 days.  - Pt denies any discomfort at time of RD visit earlier this AM.  - Wife and daughter were at bedside and were interacting with pt so visit was brief.    Diet  Order:  Diet NPO time specified TPN (CLINIMIX-E) Adult  Skin:     Last BM:  12/25  Height:   Ht Readings from Last 1 Encounters:  01/21/16 _0  (1.88 m)    Weight:   Wt Readings from Last 1 Encounters:  02/22/16 167 lb 1.7 oz (75.8 kg)    Ideal Body Weight:  86.4 kg  BMI:  Body mass index is 21.46 kg/m.  Estimated Nutritional Needs:   Kcal:  2000-2200  Protein:  100-115g  Fluid:  2L/day  EDUCATION NEEDS:   No education needs identified at this time    Jarome Matin, MS, RD,  LDN, CNSC Inpatient Clinical Dietitian Pager # [phone] After hours/weekend pager # [phone]

## 2016-02-22 NOTE — Care Management Note (Signed)
Case Management Note  Patient Details  Name: [name] MRN: [id number] Date of Birth: 05/19/1940  Subjective/Objective: Noted-trach, TPN(transition to cyclic), IR drain-purulent,GT-bilious,small abscess,EC fistula-pouch-woc following.d/c plan home vs snf.[facility] following if home, CSW following for SNF(limited facilities d/t trach,& tpn)                   Action/Plan:d/c home.   Expected Discharge Date:                  Expected Discharge Plan:  Home w Home Health Services  In-House Referral:     Discharge planning Services  CM Consult  Post Acute Care Choice:  Home Health (Active w/[facility] infusion-TPN) Choice offered to:  Patient, Adult Children  DME Arranged:    DME Agency:     HH Arranged:    HH Agency:     Status of Service:  In process, will continue to follow  If discussed at Long Length of Stay Meetings, dates discussed:    Additional Comments:  Lanier ClamMahabir, Ghassan Coggeshall, RN 02/22/2016, 1:32 PM

## 2016-02-22 NOTE — Consult Note (Signed)
WOC Nurse wound follow up Wound type:EC fistula in midline wound. Eakin fistula pouch applied on 02/16/16 (6 days ago) is still intact, but pectin backing is showing evidence of wear. Daughter and wife in room, bedside RN and WTA D. Dark assisting with procedure. Measurement: 18cm x 1.8cm x 2cm Wound bed:red, moist with dark yellow to brown effluent in a moderate amount Drainage (amount, consistency, odor) See above Periwound:Intact Dressing procedure/placement/frequency: Procedure remains unchanged in the face of previous pouch's success. 1. Cleanse wound and surrounding skin with NS 2. Shave or clip any hair growth in area of pouch or taping area 3. Cut and stretch skin barrier rings to affix as "border" around wound, overlapping as required.  4. Place Eakin pouch 5. Place hands over pouch and apply gentle warming pressure.  Verify condensation "cloud" appears over midline wound 6. Apply 1-inch paper tape as border for pouch  A key component of this pouch's success is the prompt emptying whenever effluent accumulates in the tail closure or most distal portion of pouch. Nursing staff are doing a fabulous job at keeping pouch emptied.  WOC nursing team will follow at intervals, and will remain available to this patient, the nursing, surgical and medical teams. Supply at bedside for next change (skin barrier rings, Eakin pouch (Large)) Thanks, Ladona MowLaurie Collie Wernick, MSN, RN, Perlie GoldGNP, Hans EdenCWOCN, CWON-AP, FAAN  Pager# [phone]

## 2016-02-23 LAB — CBC
HCT: 25 % — ABNORMAL LOW (ref 39.0–52.0)
Hemoglobin: 8 g/dL — ABNORMAL LOW (ref 13.0–17.0)
MCH: 29.1 pg (ref 26.0–34.0)
MCHC: 32 g/dL (ref 30.0–36.0)
MCV: 90.9 fL (ref 78.0–100.0)
PLATELETS: 681 10*3/uL — AB (ref 150–400)
RBC: 2.75 MIL/uL — ABNORMAL LOW (ref 4.22–5.81)
RDW: 15.9 % — ABNORMAL HIGH (ref 11.5–15.5)
WBC: 12.3 10*3/uL — ABNORMAL HIGH (ref 4.0–10.5)

## 2016-02-23 LAB — GLUCOSE, CAPILLARY
GLUCOSE-CAPILLARY: 128 mg/dL — AB (ref 65–99)
GLUCOSE-CAPILLARY: 130 mg/dL — AB (ref 65–99)
Glucose-Capillary: 128 mg/dL — ABNORMAL HIGH (ref 65–99)
Glucose-Capillary: 111 mg/dL — ABNORMAL HIGH (ref 65–99)
Glucose-Capillary: 98 mg/dL (ref 65–99)

## 2016-02-23 MED ORDER — FAT EMULSION 20 % IV EMUL
240.0000 mL | INTRAVENOUS | Status: AC
  Administered 2016-02-23: 240 mL via INTRAVENOUS
  Filled 2016-02-23: qty 250

## 2016-02-23 MED ORDER — TRACE MINERALS CR-CU-MN-SE-ZN 10-1000-500-60 MCG/ML IV SOLN
INTRAVENOUS | Status: AC
  Administered 2016-02-23: 18:00:00 via INTRAVENOUS
  Filled 2016-02-23: qty 2000

## 2016-02-23 NOTE — Care Management Note (Signed)
Case Management Note  Patient Details  Name: [name] MRN: [id number] Date of Birth: [birth year]  Subjective/Objective:Attending agree to explore LTACH-[facility], & [facility] reps are following for auth.Will await assessments & acceptance.CM/CSW following.                    Action/Plan:d/c LTACH   Expected Discharge Date:                  Expected Discharge Plan:  Long Term Acute Care (LTAC)  In-House Referral:  Clinical Social Work  Discharge planning Services  CM Consult  Post Acute Care Choice:  Home Health (Active w/[facility] infusion-TPN) Choice offered to:  Patient, Adult Children  DME Arranged:    DME Agency:     HH Arranged:    HH Agency:     Status of Service:  In process, will continue to follow  If discussed at Long Length of Stay Meetings, dates discussed:    Additional Comments:  Lanier ClamMahabir, Derik Fults, RN 02/23/2016, 10:29 AM

## 2016-02-23 NOTE — Progress Notes (Signed)
Physical Therapy Treatment Patient Details Name: [name] MRN: [id number] DOB: 1940/09/01 Today's Date: 02/23/2016    [facility] of [facility]Dx N/V r/o ileus. S/P 02/06/16 forEXPLORATORY LAPAROTOMY, LYSIS OF ADHESIONS, SEGMENTAL SMALL BOWEL RESECTION, GASTROJEJUNOSTOMY 12/23    PT Comments    Assisted OOB to amb a greater distance.  Daughter assisted by following with recliner.  Pt stated, 'my insides feel like mush".  PCA x 2  Follow Up Recommendations  SNF;Supervision/Assistance - 24 hour;Home health PT;LTACH (pending medical)     Equipment Recommendations       Recommendations for Other Services       Precautions / Restrictions Precautions Precautions: Fall Precaution Comments: recent LAP with mod ABD drainage, 1 drains, 1 bulb     TRACH collar  PCA Restrictions Weight Bearing Restrictions: No    Mobility  Bed Mobility Overal bed mobility: Needs Assistance Bed Mobility: Supine to Sit     Supine to sit: Min assist     General bed mobility comments: assist with upper body and 50% VC's to complete self scooting to EOB  Transfers Overall transfer level: Needs assistance Equipment used: Rolling walker (2 wheeled) Transfers: Sit to/from [employer] CorporationStand;Stand Pivot Transfers Sit to Stand: Min assist;+2 physical assistance;From elevated surface         General transfer comment: 25% VC's for direction and pt able to self rise with proper hand placement  Ambulation/Gait Ambulation/Gait assistance: Min assist;+2 safety/equipment Ambulation Distance (Feet): 135 Feet (45 feet x 3 sitting rest breaks between) Assistive device: Rolling walker (2 wheeled) Gait Pattern/deviations: Step-through pattern;Decreased stride length;Drifts right/left Gait velocity: decreased   General Gait Details: pt tolerated an increased distance.  Daughter assisted by following with recliner   Stairs            Wheelchair Mobility    Modified Rankin  (Stroke Patients Only)       Balance                                    Cognition Arousal/Alertness: Awake/alert Behavior During Therapy: WFL for tasks assessed/performed Overall Cognitive Status: Within Functional Limits for tasks assessed                      Exercises      General Comments        Pertinent Vitals/Pain Pain Assessment: Faces Faces Pain Scale: Hurts little more Pain Location: "my insides are mush" Pain Descriptors / Indicators: Cramping Pain Intervention(s): Monitored during session    Home Living                      Prior Function            PT Goals (current goals can now be found in the care plan section) Progress towards PT goals: Progressing toward goals    Frequency    Min 3X/week      PT Plan      Co-evaluation             End of Session Equipment Utilized During Treatment: Gait belt Activity Tolerance: Patient tolerated treatment well Patient left: in chair;with chair alarm set;with family/visitor present     Time: [id number] PT Time Calculation (min) (ACUTE ONLY): 33 min  Charges:  $Gait Training: 8-22 mins $Therapeutic Activity: 8-22 mins  G Codes:      Rica Koyanagi  PTA [facility]  Acute  Rehab Pager      [phone]

## 2016-02-23 NOTE — Care Management Important Message (Signed)
Important Message  Patient Details  Name: [name] MRN: [id number] Date of Birth: November 06, 1940   Medicare Important Message Given:  Yes    Caren MacadamFuller, Eliga Arvie 02/23/2016, 1:50 PMImportant Message  Patient Details  Name: [name] MRN: [id number] Date of Birth: November 06, 1940   Medicare Important Message Given:  Yes    Caren MacadamFuller, Allex Lapoint 02/23/2016, 1:49 PM

## 2016-02-23 NOTE — Care Management Note (Signed)
Case Management Note  Patient Details  Name: Danny GalRalph T [name] MRN: [id number] Date of Birth: 04-26-1940  Subjective/Objective:  Will check if appropriate for LTACH-attending paged. CM/CSW following.                  Action/Plan:d/c plan ?LTACH.   Expected Discharge Date:                  Expected Discharge Plan:  Long Term Acute Care (LTAC)  In-House Referral:  Clinical Social Work  Discharge planning Services  CM Consult  Post Acute Care Choice:  Home Health (Active w/[facility] infusion-TPN) Choice offered to:  Patient, Adult Children  DME Arranged:    DME Agency:     HH Arranged:    HH Agency:     Status of Service:  In process, will continue to follow  If discussed at Long Length of Stay Meetings, dates discussed:    Additional Comments:  Lanier ClamMahabir, [name], RN 02/23/2016, 9:30 AM

## 2016-02-23 NOTE — Progress Notes (Signed)
Referring Physician(s): Martin,M  Supervising Physician: Malachy Moan  Patient Status:  [facility] - In-pt  Chief Complaint:  LLQ abscess  Subjective:  Pt sitting up in chair; has ambulated; no new c/o  Allergies: Flomax [tamsulosin hcl]; Lisinopril; Lorazepam; and Uroxatral [alfuzosin hcl er]  Medications: Prior to Admission medications   Medication Sig Start Date End Date Taking? Authorizing Provider  aluminum-magnesium hydroxide-simethicone (MAALOX) 200-200-20 MG/5ML SUSP Take 30 mLs by mouth every 6 (six) hours as needed.   Yes Historical Provider, MD  Amino Ac Elect-Calc in D10W (CLINIMIX E/DEXTROSE, 2.75/10,) 2.75 % SOLN Inject 2,160 mLs into the vein as needed.   Yes Historical Provider, MD  aspirin 81 MG chewable tablet Place 81 mg into feeding tube every evening.   Yes Historical Provider, MD  atorvastatin (LIPITOR) 40 MG tablet Place 40 mg into feeding tube at bedtime.    Yes Historical Provider, MD  bisacodyl (DULCOLAX) 10 MG suppository Place 10 mg rectally daily as needed for moderate constipation or severe constipation.   Yes Historical Provider, MD  bismuth subsalicylate (PEPTO BISMOL) 262 MG/15ML suspension Place 30 mLs into feeding tube every 8 (eight) hours as needed for indigestion or diarrhea or loose stools.   Yes Historical Provider, MD  calcium-vitamin D (OSCAL WITH D) 500-200 MG-UNIT tablet Place 1 tablet into feeding tube daily with breakfast.    Yes Historical Provider, MD  Cyanocobalamin (VITAMIN B-12) 1000 MCG SUBL Place 1 tablet under the tongue daily with breakfast.   Yes Historical Provider, MD  Ferrous Sulfate 220 (44 Fe) MG/5ML LIQD Place 7.5 mLs into feeding tube at bedtime.    Yes Historical Provider, MD  FLUoxetine (PROZAC) 20 MG/5ML solution Place 20 mg into feeding tube daily.   Yes Historical Provider, MD  haloperidol (HALDOL) 0.5 MG tablet Take 0.25-0.5 mg by mouth 3 (three) times daily. At 6 am and 4 PM and 9 PM   Yes Historical Provider, MD   haloperidol (HALDOL) 0.5 MG tablet Place 0.5 mg into feeding tube as needed for agitation. STOP DATE 01/24/2016 01/10/16 01/24/16 Yes Historical Provider, MD  loperamide (IMODIUM) 1 MG/5ML solution 10 mg. Take 10 mls via tube every 8 hours as needed for more than 2 bms every 8 hours.   Yes Historical Provider, MD  magic mouthwash SOLN Take 15 mLs by mouth 4 (four) times daily as needed for mouth pain (sore throat). 12/30/15  Yes Karie Soda, MD  metoCLOPramide (REGLAN) 5 MG/5ML solution Place 10 mg into feeding tube every 6 (six) hours as needed for nausea or vomiting.   Yes Historical Provider, MD  metoprolol tartrate (LOPRESSOR) 25 MG tablet Place 25 mg into feeding tube daily.   Yes Historical Provider, MD  nicotine (NICODERM CQ - DOSED IN MG/24 HR) 7 mg/24hr patch Place 7 mg onto the skin daily. Rotate site   Yes Historical Provider, MD  nitroGLYCERIN (NITROSTAT) 0.4 MG SL tablet Place 0.4 mg under the tongue every 5 (five) minutes as needed for chest pain.   Yes Historical Provider, MD  Nutritional Supplements (FEEDING SUPPLEMENT, OSMOLITE 1.2 CAL,) LIQD Place 237 mLs into feeding tube. Give 1 can via G-tube every day at  10 am and  6 pm.   Yes Historical Provider, MD  omeprazole (PRILOSEC) 40 MG capsule Take 40 mg by mouth 2 (two) times daily.   Yes Historical Provider, MD  Polyvinyl Alcohol-Povidone (REFRESH OP) Place 2 drops into both eyes as needed (For dry eyes.).    Yes Historical Provider, MD  potassium chloride (KCL) 2 mEq/mL SOLN oral liquid Take 30 mEq by mouth daily.   Yes Historical Provider, MD  RESTASIS 0.05 % ophthalmic emulsion Place 1 drop into both eyes 2 (two) times daily.  04/16/13  Yes Historical Provider, MD  traZODone (DESYREL) 50 MG tablet Place 100 mg into feeding tube at bedtime.    Yes Historical Provider, MD  Water For Irrigation, Sterile (STERILE WATER FOR IRRIGATION) Irrigate with 250 mLs as directed every 6 (six) hours.    Historical Provider, MD     Vital  Signs: BP 138/66 (BP Location: Left Arm)   Pulse 92   Temp 98.6 F (37 C) (Oral)   Resp 20   Ht 6\' 2"  (1.88 m)   Wt 167 lb 15.9 oz (76.2 kg)   SpO2 97%   BMI 21.57 kg/m   Physical Exam LLQ drain intact, insertion site ok, output 30 cc; drain irrigated without difficulty  Imaging: Ct Abdomen Pelvis W Contrast  Result Date: 02/19/2016 CLINICAL DATA:  Post exploratory laparotomy, lysis of adhesions and segmental small bowel resection 02/06/2016, fish 11 test into abdominal wall, history hypertension, GERD, coronary artery disease EXAM: CT ABDOMEN AND PELVIS WITH CONTRAST TECHNIQUE: Multidetector CT imaging of the abdomen and pelvis was performed using the standard protocol following bolus administration of intravenous contrast. CONTRAST:  100 mL ISOVUE-300 IOPAMIDOL (ISOVUE-300) INJECTION 61% IV. No oral contrast administered. COMPARISON:  02/14/2016, 02/12/2016 FINDINGS: Lower chest: Bibasilar pleural effusions larger on LEFT. Subsegmental atelectasis BILATERAL lower lobes greater on RIGHT. Hepatobiliary: Subtle irregularity of hepatic margins, cannot completely exclude cirrhosis. Gallbladder surgically absent. No focal hepatic mass lesion. Pancreas: Normal appearance Spleen: Normal appearance Adrenals/Urinary Tract: Adrenal glands normal appearance. Kidneys, ureters, bladder, and prostate gland normal appearance. Stomach/Bowel: Prior gastrojejunostomy. Jejunostomy through gastrostomy tube into proximal small bowel. Dense retained barium within RIGHT colon and rectum, minimally on LEFT. No evidence of bowel obstruction. Scattered sigmoid diverticula. No bowel wall thickening. Vascular/Lymphatic: Atherosclerotic calcifications aorta without aneurysm. Scattered normal sized retroperitoneal nodes. Reproductive: N/A Other: Loculated fluid collection in the anterior upper RIGHT pelvis compatible with abscess, decreased in size since previous exam, now measuring 5.7 x 2.2 x 4.6 cm. Additional loculated fluid  collection in the mid RIGHT abdomen question additional abscess, 3.1 x 6.0 x 5.0 cm. Additional tiny gas and fluid collection at the anterior mid abdomen, 4.0 x 1.0 x 2.4 cm, decreased. Small mild fluid within ventral surgical wound. Persistent visualization of a gas and fluid collection adjacent to the lateral segment LEFT lobe liver. Decreased fluid collection in the mid abdomen and pelvis since prior pigtail drainage catheter placement. No hernia. Small focus of free air in the LEFT mid abdomen, nonspecific in the setting of a percutaneous drain and G-tube. Musculoskeletal: Osseous structures unremarkable. IMPRESSION: Interval decreases in sizes of loculated fluid collections likely abscesses within the abdomen since previous exam. No new intra- abdominal or intrapelvic abnormalities. Bibasilar effusions and atelectasis as above. Aortic atherosclerosis. Fluid collection identified at the dependent portion of the patient's ventral surgical wound though definite communication to bowel is not established by this study; consider follow-up imaging following GI contrast administration to assess for presence of and extent of any potential fistula. Electronically Signed   By: Ulyses Southward M.D.   On: 02/19/2016 17:06    Labs:  CBC:  Recent Labs  02/18/16 0422 02/19/16 0448 02/22/16 0451 02/23/16 0554  WBC 14.5* 15.9* 11.5* 12.3*  HGB 8.3* 8.5* 8.0* 8.0*  HCT 25.3* 25.5* 24.3* 25.0*  PLT  776* 837* 686* 681*    COAGS:  Recent Labs  12/04/15 2020 12/11/15 0405 12/13/15 1402 02/13/16 1140  INR 1.62 1.25 1.26 1.18  APTT 39*  --  38*  --     BMP:  Recent Labs  02/16/16 0421 02/18/16 0835 02/20/16 0330 02/22/16 0451  NA 134* 133* 135 135  K 3.6 3.8 3.8 4.1  CL 104 104 104 105  CO2 25 24 26 23   GLUCOSE 121* 115* 111* 107*  BUN 24* 25* 27* 31*  CALCIUM 8.3* 8.4* 8.6* 8.6*  CREATININE 0.63 0.68 0.67 0.78  GFRNONAA >60 >60 >60 >60  GFRAA >60 >60 >60 >60    LIVER FUNCTION  TESTS:  Recent Labs  02/13/16 0330 02/15/16 0346 02/18/16 0835 02/22/16 0451  BILITOT 0.3 0.3 0.2* <0.1*  AST 30 25 21 27   ALT 32 27 20 20   ALKPHOS 257* 287* 283* 249*  PROT 5.4* 5.6* 6.2* 6.2*  ALBUMIN 1.2* 1.3* 1.4* 1.4*    Assessment and Plan: S/p ex lap with SBR and now with EC fistula and LLQ abscess, s/p perc drain on 12/31; WBC 12.3, HGB 8.0; cont current tx/drain irrigation; will need drain injection before removal, f/u CT later this week   Electronically Signed: D. Jeananne RamaKevin Allred 02/23/2016, 1:00 PM   I spent a total of 15 minutes at the the patient's bedside AND on the patient's hospital floor or unit, greater than 50% of which was counseling/coordinating care for abdominal abscess drain    Patient ID: [name], male   DOB: Jul 21, 1940, 76 y.o.   MRN: [id number]

## 2016-02-23 NOTE — Progress Notes (Signed)
PHARMACY - ADULT TOTAL PARENTERAL NUTRITION CONSULT NOTE   Pharmacy Consult for TPN Indication: PTA TPN continuation; bowel perforation  Insulin Requirements:  SSI:  2 units Novolog sensitive SSI in past 24 hours TPN: 10 units regular insulin per day in TPN   Current Nutrition:  - NPO - cyclic TPN  IVF:  none  Central access: PICC TPN start date: PTA, restarted on admission on 12/6    Recent Labs  02/22/16 0451 02/22/16 0500  NA 135  --   K 4.1  --   CL 105  --   CO2 23  --   GLUCOSE 107*  --   BUN 31*  --   CREATININE 0.78  --   CALCIUM 8.6*  --   PHOS 4.2  --   MG 1.8  --   ALBUMIN 1.4*  --   ALKPHOS 249*  --   AST 27  --   ALT 20  --   BILITOT <0.1*  --   TRIG  --  146  PREALBUMIN 14.0*  --   Corr Ca: 10.2 on 12/30  ASSESSMENT                                                                                                          HPI: 68 yoM with hx PUD, CAD, GERD, BPH,  s/p multiple procedures  and extended hospitalization for recurrent partial GOO 2/2 pyloric stricture on TPN for several days, discharged 11/15 with J/G tube in place on TFs, unable to tolerate and had to transition back to TPN via [facility] on 11/22, readmitted 12/6 with partial SBO. Pharmacy consulted to resume TPN. Note: PTA TNA from [place] was as follows: Totals per day: 405 grams (1376 kcal)  of dextrose, 75 grams (300 kcal) of protein and 50 grams ( 480 kcal) of lipid for a total of 2156 kcal/day   Significant events:  12/6: TPN continued on admission.  12/7: Pt pulled out NGT, not replaced, remains NPO 12/11: Awaiting MBS, if no aspiration, will start to advance diet.  Having bowel function.  Continue TPN for now.   12/12: Severe aspiration risk per MBS yesterday - recommend continuing NPO. Osmolite 1.2 CAL via TF BID ordered 12/11 at 6pm. Per discussion with CCS PA, continue TPN at current goal rate today, wean tomorrow if tolerates TF. 12/13: Did not tolerate bolus feeds through  G-tube; overnight emesis and aspiration event, started on Zosyn. CCS trying trickle feeds today. 12/14 transition to cyclic TPN - appears recovery may be prolonged. Not tolerating trickle feeds; G-tube to drainage.  12/15: TPN was not run at cyclic rate overnight d/t conflicting information in TPN order. To provide patient full needed nutrition, will continue to run TPN at 83 ml/hr over 24 hr and begin 18-hr cycle with tonight's TPN 12/16: start 12h TPN cycle 12/18: resume trickle tube feed at 5 ml/hr 12/19: Nausea/vomiting, tube feed held. TPN formula changed to E 5/20 from E 5/15 12/21: successful transition of G tube to [place] tube. Plan scan for patency before resuming  tube feeds.  12/22: Upper GI scan in IR today to ensure J-tube working.  12/23: exp lap, lysis of adhesions, segmental small bowel resection, gastrojejunostomy 12/25: TPN converted from cyclic back to continuous due to hypoglycemic event following end of TPN cycle and patient likely to remain hospitalized for at least several more days.  Prealbumin low, falling - Fat Emulsion increased to 10 mL/hr.  Clinimix already at 2L/day. 12/28: to start trickle feeds thru J-tube; G-tube remains on drainage 12/29: TF increased from 10 ml/hr to 20 ml/hr  12/29 PM: TF was found draining from midline incision, CT abdomen c/w bowel perforation at G-J suture site; interval development of abscesses.  TF was stopped and is to remain off until MD orders otherwise. 12/31: percutaneous drainage of LLQ fluid collection. 1/3: EC Fistula - feculent material from drain placed 12/31 after perc drainage  & in midline wound.  1/4: Changed to cyclic TPN over 18 hours 1/8: cycle TPN over 16 hours  Today, 02/23/2016:   Glucose: within goal (100-150), range 96-125  Electrolytes:  WNL (1/8)  Renal: SCr WNL, BUN elevated    LFTs:  Alk phos elevated but decreased (1/8).  All others < ULN.  TGs (12/24) 134, (1/1) 145, 146 (1/8)  Prealbumin:  15.3 ( 11/13),  10.8 (12/7), 12.8 (12/11), 13.1 (12/18), 8.9 (12/25), 10 (1/1), 14 (1/8)  Total drain output (PEG/jejunostomy & JP): decreased to 385 ml/24 hr   NUTRITIONAL GOALS                                                                                             RD recs (12/26): Kcal 2000-2200, Protein 100-115g, Fluid 2L/day [facility] TPN: 2156 Kcal/day, Protein 75g/day  Clinimix E 5/20 2000 ml/day + 20% fat emulsion 240 ml/day: 100 g/day protein, 2240 Kcal/day. (+++There is currently a Psychologist, prison and probation services of Clinimix solution.  Pharmacy will use Clinimix product based on what we have available in stock+++)  PLAN                                                                                   At 1800 today:  Continue cyclic TPN over 16 hours: Clinimix E 5/20 at 50 mls/hr x 1 hour then increase to 135 mls/hr x 14 hours then 50 mls/hr x 1 hour then stop.  Cycle  20% fat emulsion 236m over 16 hours.  Continue 10 units regular insulin per day in TPN.   Continue standard multivitamins and trace elements in TPN.  Continue sensitive SSI and CBG checks q6h.  TPN lab panels on Mondays & Thursdays  Follow clinical course daily.  BMET with AM labs.  CGretta ArabPharmD, BCPS Pager [phone]/10/2016 6:42 AM

## 2016-02-23 NOTE — Progress Notes (Signed)
[facility] Progress Note  17 Days Post-Op  Subjective: Pt feeling okay today. Less tired. Was up walking with PT today. In good spirits. Daughter was at bedside. No new complaints. No change in abdominal discomfort.  Objective: Vital signs in last 24 hours: Temp:  [98 F (36.7 C)-98.6 F (37 C)] 98.6 F (37 C) (01/09 0700) Pulse Rate:  [89-107] 92 (01/09 0700) Resp:  [11-20] 20 (01/09 0955) BP: (120-138)/(48-71) 138/66 (01/09 0700) SpO2:  [92 %-99 %] 97 % (01/09 0955) FiO2 (%):  [28 %] 28 % (01/09 0312) Weight:  [167 lb 15.9 oz (76.2 kg)] 167 lb 15.9 oz (76.2 kg) (01/09 0700) Last BM Date: 02/08/16  Intake/Output from previous day: 01/08 0701 - 01/09 0700 In: 2077.9 [P.O.:30; I.V.:1977.9; IV Piggyback:50] Out: 1690 [Urine:1275; Drains:415] Intake/Output this shift: Total I/O In: 10 [I.V.:10] Out: 275 [Urine:275]  PE: Gen:  Alert, NAD, pleasant, cooperative, appears more awake and energetic  Card:  RRR, no M/G/R heard Pulm:  effort normal, CTA bilaterally, no wheezes rales or rhonchi noted. Mildly decreased breath sounds in the bases. Abd: Soft, non distended, +BS, Eakin's pouch in place with feculent looking material in pouch, IR drain with purulent drainage and Gtube with bilious drainage.   Skin: no rashes noted, warm and dry, pale  Lab Results:   Recent Labs  02/22/16 0451 02/23/16 0554  WBC 11.5* 12.3*  HGB 8.0* 8.0*  HCT 24.3* 25.0*  PLT 686* 681*   BMET  Recent Labs  02/22/16 0451  NA 135  K 4.1  CL 105  CO2 23  GLUCOSE 107*  BUN 31*  CREATININE 0.78  CALCIUM 8.6*   PT/INR No results for input(s): LABPROT, INR in the last 72 hours. CMP     Component Value Date/Time   NA 135 02/22/2016 0451   NA 139 01/14/2016   K 4.1 02/22/2016 0451   CL 105 02/22/2016 0451   CO2 23 02/22/2016 0451   GLUCOSE 107 (H) 02/22/2016 0451   BUN 31 (H) 02/22/2016 0451   BUN 23 (A) 01/14/2016   CREATININE 0.78 02/22/2016 0451   CALCIUM 8.6 (L)  02/22/2016 0451   PROT 6.2 (L) 02/22/2016 0451   ALBUMIN 1.4 (L) 02/22/2016 0451   AST 27 02/22/2016 0451   ALT 20 02/22/2016 0451   ALKPHOS 249 (H) 02/22/2016 0451   BILITOT <0.1 (L) 02/22/2016 0451   GFRNONAA >60 02/22/2016 0451   GFRAA >60 02/22/2016 0451   Lipase     Component Value Date/Time   LIPASE 55 (H) 01/20/2016 0158       Studies/Results: No results found.  Anti-infectives: Anti-infectives    Start     Dose/Rate Route Frequency Ordered Stop   02/17/16 2000  cefTRIAXone (ROCEPHIN) 2 g in dextrose 5 % 50 mL IVPB     2 g 100 mL/hr over 30 Minutes Intravenous Every 24 hours 02/17/16 1434     02/12/16 2200  vancomycin (VANCOCIN) 1,500 mg in sodium chloride 0.9 % 500 mL IVPB  Status:  Discontinued     1,500 mg 250 mL/hr over 120 Minutes Intravenous Every 24 hours 02/12/16 1618 02/16/16 1340   02/09/16 0200  vancomycin (VANCOCIN) IVPB 1000 mg/200 mL premix  Status:  Discontinued     1,000 mg 200 mL/hr over 60 Minutes Intravenous Every 12 hours 02/08/16 1300 02/12/16 1359   02/08/16 1400  vancomycin (VANCOCIN) IVPB 1000 mg/200 mL premix  Status:  Discontinued     1,000 mg 200 mL/hr over 60 Minutes  Intravenous Every 12 hours 02/08/16 1259 02/08/16 1300   02/08/16 1300  vancomycin (VANCOCIN) IVPB 1000 mg/200 mL premix     1,000 mg 200 mL/hr over 60 Minutes Intravenous  Once 02/08/16 1217 02/08/16 1336   02/08/16 1200  piperacillin-tazobactam (ZOSYN) IVPB 3.375 g  Status:  Discontinued     3.375 g 12.5 mL/hr over 240 Minutes Intravenous Every 8 hours 02/08/16 1136 02/17/16 1433   02/06/16 1415  piperacillin-tazobactam (ZOSYN) IVPB 3.375 g     3.375 g 100 mL/hr over 30 Minutes Intravenous  Once 02/06/16 1413 02/06/16 1409   01/26/16 2300  piperacillin-tazobactam (ZOSYN) IVPB 3.375 g  Status:  Discontinued     3.375 g 12.5 mL/hr over 240 Minutes Intravenous Every 8 hours 01/26/16 2256 02/04/16 0905   01/26/16 2230  vancomycin (VANCOCIN) IVPB 1000 mg/200 mL premix   Status:  Discontinued     1,000 mg 200 mL/hr over 60 Minutes Intravenous Every 12 hours 01/26/16 2227 01/26/16 2227   01/20/16 0400  vancomycin (VANCOCIN) IVPB 1000 mg/200 mL premix  Status:  Discontinued     1,000 mg 200 mL/hr over 60 Minutes Intravenous Every 12 hours 01/20/16 0323 01/21/16 0900   01/20/16 0330  ceFEPIme (MAXIPIME) 1 g in dextrose 5 % 50 mL IVPB     1 g 100 mL/hr over 30 Minutes Intravenous  Once 01/20/16 0317 01/20/16 0735       Assessment/Plan  PSBO with 2 days of nausea and vomiting on readmit 01/20/16 prior procedures above S/p EXPLORATORY LAPAROTOMY, LYSIS OF ADHESIONS, SEGMENTAL SMALL BOWEL RESECTION, GASTROJEJUNOSTOMY , 02/06/16, Dr. Chevis Pretty G tube dislodged 02/10/16, bed tansfer New EC fistula midline 02/13/16/CT shows LLQ intraabdominal abscess CT guided LLQabdominal abscess drainage, 02/14/16; IR Dr. Fredia Sorrow Post op Ventricular tachycardia Tracheostomy Aspiration pneumonia/pleural effusion Malnutrition/deconditioning Anemia  Stage II sacral decubitus Malnutrition formerly on TF/now on TNA  FEN: NPO/TNA  ID: Zosyn 02/06/16 thru 02/17/16; Vancomycin 02/08/16 thru 02/15/16; Rocephin started 02/17/16 =>>day 6 DVT: Heparin/SCD   Plan: Continue TPN, monitor fistula output. G tube output was less than day before. Continue supportive care.  WBC slightly elevated from yesterday. Hg stable. AM labs. Possible removal of trach???  Appreciated IR's recommendations: no additional drains needed at this time; collections smaller in size; cont current tx; will need LLQ drain injection before removal contemplated.  Case mngr: [facility] reps are following for auth    LOS: 34 days    Jerre Simon , [facility] 02/23/2016, 10:46 AM Pager: [phone] Consults: [phone] Mon-Fri 7:00 am-4:30 pm Sat-Sun 7:00 am-11:30 am

## 2016-02-24 ENCOUNTER — Inpatient Hospital Stay (HOSPITAL_COMMUNITY): Admit: 2016-02-24 | Payer: Medicare Other

## 2016-02-24 DIAGNOSIS — R531 Weakness: Secondary | ICD-10-CM

## 2016-02-24 DIAGNOSIS — J9611 Chronic respiratory failure with hypoxia: Secondary | ICD-10-CM

## 2016-02-24 DIAGNOSIS — Z930 Tracheostomy status: Secondary | ICD-10-CM

## 2016-02-24 LAB — BASIC METABOLIC PANEL
ANION GAP: 5 (ref 5–15)
BUN: 37 mg/dL — ABNORMAL HIGH (ref 6–20)
CALCIUM: 8.7 mg/dL — AB (ref 8.9–10.3)
CO2: 25 mmol/L (ref 22–32)
Chloride: 105 mmol/L (ref 101–111)
Creatinine, Ser: 0.69 mg/dL (ref 0.61–1.24)
Glucose, Bld: 124 mg/dL — ABNORMAL HIGH (ref 65–99)
POTASSIUM: 4.1 mmol/L (ref 3.5–5.1)
SODIUM: 135 mmol/L (ref 135–145)

## 2016-02-24 LAB — GLUCOSE, CAPILLARY
GLUCOSE-CAPILLARY: 132 mg/dL — AB (ref 65–99)
GLUCOSE-CAPILLARY: 88 mg/dL (ref 65–99)
GLUCOSE-CAPILLARY: 92 mg/dL (ref 65–99)
Glucose-Capillary: 117 mg/dL — ABNORMAL HIGH (ref 65–99)

## 2016-02-24 LAB — CBC
HEMATOCRIT: 25.1 % — AB (ref 39.0–52.0)
HEMOGLOBIN: 8.1 g/dL — AB (ref 13.0–17.0)
MCH: 29.3 pg (ref 26.0–34.0)
MCHC: 32.3 g/dL (ref 30.0–36.0)
MCV: 90.9 fL (ref 78.0–100.0)
Platelets: 676 10*3/uL — ABNORMAL HIGH (ref 150–400)
RBC: 2.76 MIL/uL — ABNORMAL LOW (ref 4.22–5.81)
RDW: 15.7 % — ABNORMAL HIGH (ref 11.5–15.5)
WBC: 11.4 10*3/uL — AB (ref 4.0–10.5)

## 2016-02-24 MED ORDER — TRACE MINERALS CR-CU-MN-SE-ZN 10-1000-500-60 MCG/ML IV SOLN
INTRAVENOUS | Status: AC
  Administered 2016-02-24: 18:00:00 via INTRAVENOUS
  Filled 2016-02-24: qty 1990

## 2016-02-24 MED ORDER — FAT EMULSION 20 % IV EMUL
250.0000 mL | INTRAVENOUS | Status: AC
  Administered 2016-02-24: 250 mL via INTRAVENOUS
  Filled 2016-02-24: qty 250

## 2016-02-24 NOTE — Progress Notes (Signed)
[facility] Progress Note  18 Days Post-Op  Subjective: Pt states his morning pain meds were late. Otherwise no new complaints.   Objective: Vital signs in last 24 hours: Temp:  [97.6 F (36.4 C)-98.8 F (37.1 C)] 98.5 F (36.9 C) (01/10 1000) Pulse Rate:  [72-95] 95 (01/10 1000) Resp:  [16-20] 16 (01/10 1000) BP: (116-149)/(46-75) 133/52 (01/10 1000) SpO2:  [93 %-100 %] 100 % (01/10 0856) FiO2 (%):  [28 %] 28 % (01/10 0856) Weight:  [167 lb 12.3 oz (76.1 kg)] 167 lb 12.3 oz (76.1 kg) (01/10 0500) Last BM Date: 02/08/16  Intake/Output from previous day: 01/09 0701 - 01/10 0700 In: 314 [I.V.:254; IV Piggyback:50] Out: 2115 [Urine:1375; Drains:740] Intake/Output this shift: Total I/O In: -  Out: 125 [Drains:125]  PE: Gen: Alert, NAD, pleasant, cooperative, lying in bed  Card: RRR, no M/G/R heard Pulm: effort normal, CTA bilaterally anteriorly, no wheezes rales or rhonchi noted.  Abd: Soft, non distended, +BS, Eakin's pouch in place with feculent looking material in pouch, IR drain with purulent drainage and Gtube with bilious drainage. Black eschar noted around G tube with tenderness no blood or erythema noted.  Skin: no rashes noted, warm and dry, pale  Lab Results:   Recent Labs  02/23/16 0554 02/24/16 0519  WBC 12.3* 11.4*  HGB 8.0* 8.1*  HCT 25.0* 25.1*  PLT 681* 676*   BMET  Recent Labs  02/22/16 0451 02/24/16 0519  NA 135 135  K 4.1 4.1  CL 105 105  CO2 23 25  GLUCOSE 107* 124*  BUN 31* 37*  CREATININE 0.78 0.69  CALCIUM 8.6* 8.7*   PT/INR No results for input(s): LABPROT, INR in the last 72 hours. CMP     Component Value Date/Time   NA 135 02/24/2016 0519   NA 139 01/14/2016   K 4.1 02/24/2016 0519   CL 105 02/24/2016 0519   CO2 25 02/24/2016 0519   GLUCOSE 124 (H) 02/24/2016 0519   BUN 37 (H) 02/24/2016 0519   BUN 23 (A) 01/14/2016   CREATININE 0.69 02/24/2016 0519   CALCIUM 8.7 (L) 02/24/2016 0519   PROT 6.2 (L)  02/22/2016 0451   ALBUMIN 1.4 (L) 02/22/2016 0451   AST 27 02/22/2016 0451   ALT 20 02/22/2016 0451   ALKPHOS 249 (H) 02/22/2016 0451   BILITOT <0.1 (L) 02/22/2016 0451   GFRNONAA >60 02/24/2016 0519   GFRAA >60 02/24/2016 0519   Lipase     Component Value Date/Time   LIPASE 55 (H) 01/20/2016 0158       Studies/Results: No results found.  Anti-infectives: Anti-infectives    Start     Dose/Rate Route Frequency Ordered Stop   02/17/16 2000  cefTRIAXone (ROCEPHIN) 2 g in dextrose 5 % 50 mL IVPB     2 g 100 mL/hr over 30 Minutes Intravenous Every 24 hours 02/17/16 1434     02/12/16 2200  vancomycin (VANCOCIN) 1,500 mg in sodium chloride 0.9 % 500 mL IVPB  Status:  Discontinued     1,500 mg 250 mL/hr over 120 Minutes Intravenous Every 24 hours 02/12/16 1618 02/16/16 1340   02/09/16 0200  vancomycin (VANCOCIN) IVPB 1000 mg/200 mL premix  Status:  Discontinued     1,000 mg 200 mL/hr over 60 Minutes Intravenous Every 12 hours 02/08/16 1300 02/12/16 1359   02/08/16 1400  vancomycin (VANCOCIN) IVPB 1000 mg/200 mL premix  Status:  Discontinued     1,000 mg 200 mL/hr over 60 Minutes Intravenous Every 12  hours 02/08/16 1259 02/08/16 1300   02/08/16 1300  vancomycin (VANCOCIN) IVPB 1000 mg/200 mL premix     1,000 mg 200 mL/hr over 60 Minutes Intravenous  Once 02/08/16 1217 02/08/16 1336   02/08/16 1200  piperacillin-tazobactam (ZOSYN) IVPB 3.375 g  Status:  Discontinued     3.375 g 12.5 mL/hr over 240 Minutes Intravenous Every 8 hours 02/08/16 1136 02/17/16 1433   02/06/16 1415  piperacillin-tazobactam (ZOSYN) IVPB 3.375 g     3.375 g 100 mL/hr over 30 Minutes Intravenous  Once 02/06/16 1413 02/06/16 1409   01/26/16 2300  piperacillin-tazobactam (ZOSYN) IVPB 3.375 g  Status:  Discontinued     3.375 g 12.5 mL/hr over 240 Minutes Intravenous Every 8 hours 01/26/16 2256 02/04/16 0905   01/26/16 2230  vancomycin (VANCOCIN) IVPB 1000 mg/200 mL premix  Status:  Discontinued     1,000  mg 200 mL/hr over 60 Minutes Intravenous Every 12 hours 01/26/16 2227 01/26/16 2227   01/20/16 0400  vancomycin (VANCOCIN) IVPB 1000 mg/200 mL premix  Status:  Discontinued     1,000 mg 200 mL/hr over 60 Minutes Intravenous Every 12 hours 01/20/16 0323 01/21/16 0900   01/20/16 0330  ceFEPIme (MAXIPIME) 1 g in dextrose 5 % 50 mL IVPB     1 g 100 mL/hr over 30 Minutes Intravenous  Once 01/20/16 0317 01/20/16 0735       Assessment/Plan  PSBO with 2 days of nausea and vomiting on readmit 01/20/16 prior procedures above S/p EXPLORATORY LAPAROTOMY, LYSIS OF ADHESIONS, SEGMENTAL SMALL BOWEL RESECTION, GASTROJEJUNOSTOMY , 02/06/16, Dr. Chevis PrettyPaul Toth G tube dislodged 02/10/16, bed tansfer New EC fistula midline 02/13/16/CT shows LLQ intraabdominal abscess CT guided LLQabdominal abscess drainage, 02/14/16; IR Dr. Fredia SorrowYamagata Post op Ventricular tachycardia Tracheostomy Aspiration pneumonia/pleural effusion Malnutrition/deconditioning Anemia  Stage II sacral decubitus Malnutrition formerly on TF/now on TNA  FEN: NPO/TNA  ID: Zosyn 02/06/16 thru 02/17/16; Vancomycin 02/08/16 thru 02/15/16; Rocephin started 02/17/16 =>>day 7 DVT: Heparin/SCD   Plan: Continue TPN, monitor fistula output. G tube output increased from day before. Continue supportive care.  WBC trending down. Hg stable. CCM plans for trach decannulation today. WOC to look at [place].  Appreciated IR's recommendations: no additional drains needed at this time; collections smaller in size; cont current tx; will need LLQ drain injection before removal contemplated.  Case mngr: [facility] reps are following for auth   LOS: 35 days    Jerre SimonJessica L Teryn Boerema , [facility] 02/24/2016, 10:34 AM Pager: [phone] Consults: [phone] Mon-Fri 7:00 am-4:30 pm Sat-Sun 7:00 am-11:30 am

## 2016-02-24 NOTE — Progress Notes (Signed)
Patient tolerating decannulation well. No distress noted and patient denies any breathing trouble. Family at bedside with patient. RT will continue to monitor patient.

## 2016-02-24 NOTE — Progress Notes (Signed)
Patient decannulated per NP order; patient tolerated well.  Patient placed on 2 L [place]. No distress noted at this time. RT will continue to monitor patient.

## 2016-02-24 NOTE — Progress Notes (Signed)
[facility]  Subjective:  On t collar 25/7. Ambulated in hall with t collar. #6 cuffed trach with out secretions. Speech clear.   Objective: Vitals:   02/24/16 0500 02/24/16 0528 02/24/16 0800 02/24/16 0856  BP:  137/75    Pulse:  72  75  Resp:  16 16 16   Temp:  98.6 F (37 C)    TempSrc:  Oral    SpO2:  98% 96% 100%  Weight: 167 lb 12.3 oz (76.1 kg)     Height:         Exam: Awake, alert, resting quietly in bed, no distress Tracheostomy in place, no drainage nearby, PMV in place , voice clear Lungs clear with normal effort, occasional cough Cardiovascular regular rate and rhythm no murmurs gallops or rubs Neurologic: No distress, spontaneous movement of ext's    01/21/2016 chest x-ray with very mild left lower lobe airspace disease 12/11 SLP Eval >> mild oral phase dysphagia, moderate pharyngeal phase dysphagia, mild cervical esophageal dysphagia  12/11/15 perc trach by DF.  Impression: Tracheostomy status since 10/17.  Small bowel obstruction per surgery  Discussion: Mr. [name] is well-known to our service from a recent ICU stay where he required tracheostomy placement. He has been ambulating in hall with t collar. He is having trouble being placed [facility]. Plan: Decannulation 1/10 All other issues per CCS   PCCM will be available PRN.  Please call if new needs arise.    [name] ACNP [facility] Pager [phone] till 3 pm If no answer page [phone] 02/24/2016, 9:29 AM

## 2016-02-24 NOTE — Progress Notes (Signed)
Received a call from [facility], insurance approval is complete, they have a bed available and are able to accept patient today. Notified Dr. Michaell CowingGross, patient, and spouse.

## 2016-02-24 NOTE — Progress Notes (Signed)
PHARMACY - ADULT TOTAL PARENTERAL NUTRITION CONSULT NOTE   Pharmacy Consult for TPN Indication: PTA TPN continuation; bowel perforation  Insulin Requirements:  SSI:  2 units Novolog sensitive SSI in past 24 hours TPN: 10 units regular insulin per day in TPN   Current Nutrition:  - NPO - cyclic TPN  IVF:  none  Central access: PICC TPN start date: PTA, restarted on admission on 12/6    Recent Labs  02/22/16 0451 02/22/16 0500 02/24/16 0519  NA 135  --  135  K 4.1  --  4.1  CL 105  --  105  CO2 23  --  25  GLUCOSE 107*  --  124*  BUN 31*  --  37*  CREATININE 0.78  --  0.69  CALCIUM 8.6*  --  8.7*  PHOS 4.2  --   --   MG 1.8  --   --   ALBUMIN 1.4*  --   --   ALKPHOS 249*  --   --   AST 27  --   --   ALT 20  --   --   BILITOT <0.1*  --   --   TRIG  --  146  --   PREALBUMIN 14.0*  --   --   Corr Ca: 10.2 on 12/30  ASSESSMENT                                                                                                          HPI: 51 yoM with hx PUD, CAD, GERD, BPH,  s/p multiple procedures  and extended hospitalization for recurrent partial GOO 2/2 pyloric stricture on TPN for several days, discharged 11/15 with J/G tube in place on TFs, unable to tolerate and had to transition back to TPN via [facility] on 11/22, readmitted 12/6 with partial SBO. Pharmacy consulted to resume TPN. Note: PTA TNA from [place] was as follows: Totals per day: 405 grams (1376 kcal)  of dextrose, 75 grams (300 kcal) of protein and 50 grams ( 480 kcal) of lipid for a total of 2156 kcal/day   Significant events:  12/6: TPN continued on admission.  12/7: Pt pulled out NGT, not replaced, remains NPO 12/11: Awaiting MBS, if no aspiration, will start to advance diet.  Having bowel function.  Continue TPN for now.   12/12: Severe aspiration risk per MBS yesterday - recommend continuing NPO. Osmolite 1.2 CAL via TF BID ordered 12/11 at 6pm. Per discussion with CCS PA, continue TPN at current  goal rate today, wean tomorrow if tolerates TF. 12/13: Did not tolerate bolus feeds through G-tube; overnight emesis and aspiration event, started on Zosyn. CCS trying trickle feeds today. 12/14 transition to cyclic TPN - appears recovery may be prolonged. Not tolerating trickle feeds; G-tube to drainage.  12/15: TPN was not run at cyclic rate overnight d/t conflicting information in TPN order. To provide patient full needed nutrition, will continue to run TPN at 83 ml/hr over 24 hr and begin 18-hr cycle with tonight's TPN 12/16: start 12h TPN cycle  12/18: resume trickle tube feed at 5 ml/hr 12/19: Nausea/vomiting, tube feed held. TPN formula changed to E 5/20 from E 5/15 12/21: successful transition of G tube to [place] tube. Plan scan for patency before resuming tube feeds.  12/22: Upper GI scan in IR today to ensure J-tube working.  12/23: exp lap, lysis of adhesions, segmental small bowel resection, gastrojejunostomy 12/25: TPN converted from cyclic back to continuous due to hypoglycemic event following end of TPN cycle and patient likely to remain hospitalized for at least several more days.  Prealbumin low, falling - Fat Emulsion increased to 10 mL/hr.  Clinimix already at 2L/day. 12/28: to start trickle feeds thru J-tube; G-tube remains on drainage 12/29: TF increased from 10 ml/hr to 20 ml/hr  12/29 PM: TF was found draining from midline incision, CT abdomen c/w bowel perforation at G-J suture site; interval development of abscesses.  TF was stopped and is to remain off until MD orders otherwise. 12/31: percutaneous drainage of LLQ fluid collection. 1/3: EC Fistula - feculent material from drain placed 12/31 after perc drainage  & in midline wound.  1/4: Changed to cyclic TPN over 18 hours 1/8: cycle TPN over 16 hours  Today, 02/24/2016:   Glucose: within goal (100-150), range 98-132  Electrolytes:  WNL   Renal: SCr WNL, BUN elevated    LFTs:  Alk phos elevated but decreased (1/8).  All  others < ULN.  TGs (12/24) 134, (1/1) 145, 146 (1/8)  Prealbumin:  15.3 ( 11/13), 10.8 (12/7), 12.8 (12/11), 13.1 (12/18), 8.9 (12/25), 10 (1/1), 14 (1/8)  Total drain output (PEG/jejunostomy & JP): decreased to 385 ml/24 hr   NUTRITIONAL GOALS                                                                                             RD recs (12/26): Kcal 2000-2200, Protein 100-115g, Fluid 2L/day [facility] TPN: 2156 Kcal/day, Protein 75g/day  Clinimix E 5/20 2000 ml/day + 20% fat emulsion 240 ml/day: 100 g/day protein, 2240 Kcal/day. (+++There is currently a Psychologist, prison and probation services of Clinimix solution.  Pharmacy will use Clinimix product based on what we have available in stock+++)  PLAN                                                                                   At 1800 today:  Continue cyclic TPN over 16 hours: Clinimix E 5/20 at 50 mls/hr x 1 hour then increase to 135 mls/hr x 14 hours then 50 mls/hr x 1 hour then stop.  Cycle  20% fat emulsion 268m over 16 hours.  Continue 10 units regular insulin per day in TPN.   Continue standard multivitamins and trace elements in TPN.  Continue sensitive SSI and CBG checks q6h.  TPN lab panels on Mondays & Thursdays  Follow clinical  course daily.   Dolly Rias RPh 02/24/2016, 9:44 AM Pager [phone]

## 2016-02-24 NOTE — Progress Notes (Signed)
Occupational Therapy Treatment Patient Details Name: Ardyth GalRalph T Kelm MRN: [id number] DOB: 09/20/1940 Today's Date: 02/24/2016    [facility]Dx N/V r/o ileus. S/P 02/06/16 forEXPLORATORY LAPAROTOMY, LYSIS OF ADHESIONS, SEGMENTAL SMALL BOWEL RESECTION, GASTROJEJUNOSTOMY 12/23   OT comments  Pt had trach removed this day!    Follow Up Recommendations  CIR;Home health OT;Supervision/Assistance - 24 hour;LTACH          Precautions / Restrictions Precautions Precautions: Fall Precaution Comments: recent LAP with mod ABD drainage, 1 drains, 1 bulb    , PCA, trach capped Restrictions Weight Bearing Restrictions: No       Mobility Bed Mobility Overal bed mobility: Needs Assistance Bed Mobility: Supine to Sit     Supine to sit: Min guard;Min assist     General bed mobility comments: pt sitting in chair  Transfers Overall transfer level: Needs assistance Equipment used: Rolling walker (2 wheeled) Transfers: Sit to/from Stand Sit to Stand: Mod assist         General transfer comment: 25% VC's for direction and pt able to self rise with proper hand placement        ADL Overall ADL's : Needs assistance/impaired   Eating/Feeding Details (indicate cue type and reason): pt NPO Grooming: Sitting;Minimal assistance                                 General ADL Comments: Noted pts trach removed.  OT called  admissions at inpatient rehab to see if pt a candidate for inpt rehab.  Rehab admissions will look at pts chart.  Feel pt would be a candidate as he is progressing with therapy                Cognition   Behavior During Therapy: WFL for tasks assessed/performed Overall Cognitive Status: Within Functional Limits for tasks assessed                       Extremity/Trunk Assessment               Exercises  Pts family working on DC plan- pt stated he would do BUE later today            Pertinent Vitals/ Pain       Pain Location: pt did not complain of pain during OT session         Frequency  Min 2X/week        Progress Toward Goals  OT Goals(current goals can now be found in the care plan section)     Acute Rehab OT Goals Patient Stated Goal: get well and get home  Plan Discharge plan remains appropriate       End of Session Equipment Utilized During Treatment: Rolling walker   Activity Tolerance Patient tolerated treatment well   Patient Left in chair;with call bell/phone within reach;with family/visitor present   Nurse Communication Mobility status        Time: 1455-1510 OT Time Calculation (min): 15 min  Charges: OT General Charges $OT Visit: 1 Procedure OT Treatments $Self Care/Home Management : 8-22 mins  Preslynn Bier, Karin GoldenLorraine D 02/24/2016, 3:25 PM

## 2016-02-24 NOTE — Discharge Summary (Signed)
[facility] Discharge Summary   Patient ID: [name] MRN: [id number] DOB/AGE: 07/28/1940 76 y.o.  Admit date: 01/19/2016 Discharge date: 02/26/2016  Admitting Diagnosis:  Partial Small Bowel Obstruction  Discharge Diagnosis Patient Active Problem List   Diagnosis Date Noted  . Tracheostomy status (HCC)   . Chronic respiratory failure with hypoxia (HCC)   . Enterocutaneous fistula 02/13/2016  . Anemia of chronic disease 01/31/2016  . Stage 2 skin ulcer of sacral region 01/31/2016  . Hypomagnesemia 01/21/2016  . Hypokalemia 01/21/2016  . SBO (small bowel obstruction) 01/20/2016  . Pressure injury of skin 01/20/2016  . Tracheostomy care (HCC)   . Pleural effusion   . Gastroesophageal reflux disease 01/19/2016  . Insomnia 01/19/2016  . Aspiration pneumonia (HCC) 01/17/2016  . On total parenteral nutrition (TPN) 01/17/2016  . Metabolic acidosis 01/17/2016  . Skin ulcer of abdominal wall, limited to breakdown of skin (HCC) 01/17/2016  . Dysphagia 01/17/2016  . Generalized weakness 01/17/2016  . 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 Vitamin B12 deficiency 01/02/2016  . BPH (benign prostatic hyperplasia)   . Posterior vitreous detachment   . Salzmann's nodular dystrophy of left eye   . Hyperopia of both eyes with astigmatism and presbyopia   . Central pterygium of right eye   . Pressure ulcer of buttock, right, unstageable (HCC) 12/30/2015  . Hypernatremia 12/28/2015  . Tracheostomy in place [facility]) 12/25/2015  . Tobacco abuse 12/09/2015  . Ischemic right index finger at tip 12/09/2015  . Gastritis 12/09/2015  . Gastric tube present (LUQ) 12/09/2015  . Acute respiratory failure with hypoxemia (HCC)   . Protein-calorie malnutrition, severe 12/04/2015  . Encephalopathy acute 12/03/2015  . Pyloric stricture 11/24/2015  . Anxiety state 09/28/2015  . Hyperlipidemia   . 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 09/25/2015  09/25/2015  . Coronary atherosclerosis of native coronary artery 04/24/2013  . Essential hypertension, benign 04/24/2013  . Other and unspecified hyperlipidemia 04/24/2013  . Central pterygium 10/30/2012  . 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 Cataract, nuclear sclerotic senile 10/30/2012    Consultants Dr. Annett Fabian, Critical Care Dr. Coral Else, Vascular Surgery Dr. Irish Lack, Vascular and Interventional Radiology Dr. Willis Modena, GI Dr. Thad Ranger, Internal Medicine  Procedures Dr. Carolynne Edouard (02/06/16) - Exploratory Laparotomy, Lysis of adhesions, Segmental small bowel resection, gastrojejunostomy  Hospital Course:  Pt is a 76 male who presented to [facility] ED with nausea, vomiting, diarrhea. Two days prior to arrival to the ED the pt was receiving all of his feeds via G-tube. 2 days prior to ED arrival he began gradually increasing his oral intake after passing a blue dye swallow study at the nursing facility. Pt on TPN and with gastric tube in place. Workup showed partial small bowel obstruction with transition point in anterior RLQ. Pt was admitted to [facility] service. NG tube was placed. Next day NG tube accidentally got pulled out. It was not replaced and bowel function appeared to be returning by day 5. Swallow study done on day 5 revealed pt was a high aspiration risk and pt was NPO and bolus tube feeds were restarted and TPN continued. Pt was found to have aspiration PNA and was placed on Zosyn and tube feeds backed off to trickle G-tube feeds. pt started on reglan. Day 8 pt began having abdominal pain. Abd xray it showed a dilated stomach and all tube feeds were discontinued and g-tube was placed to gravity drainage. On day 12, abd  xray showed some improvement in bowel dilation so trickle g-tube feeds were restarted. Pt did not tolerate the g-tube feeds and they were again discontinued and g-tube again placed to gravity. CCM was consulted due to  concerns for aspiration and new small left pleural effusion on chest xray. On day 14; UGI with small bowel showed a gastric outlet obstruction. IR was consulted to replace gtube with gjtube. Day 15, IR converted g-tube to GJ tube. UGI with small bowel follow through was again obtained and revealed obstruction. On day 17 pt was taken to the OR and underwent procedure listed above. He tolerated the procedure well. Pt had an open midline wound with BID wound care. On day 21 pt was moved out of the ICU onto the floor. On day 23, J-tube feeds were started and his magnesium was replenished. On day 24 it was noted that the pt was having enteric contents draining from his midline wound and he now had a EC fistula and an eakins pouch was placed. A CT with limited contrast through his g tube was done and revealed multiple intraabdominal abscesses and evidence for perforation adjacent to the gj-tube. IR was consulted for drain placement. Tube feedings discontinued. On day 30 a CT was preformed due to elevated WBC and showed decrease in size of abscesses. On day 35 pt's trach was removed. Day 36 IR drain study showed persistent SB fistula. [facility] and [facility].    Current Facility-Administered Medications:  .  0.9 %  sodium chloride infusion, , Intravenous, Once, Elisabeth CaraLacey A Armistead, CRNA .  albuterol (PROVENTIL) (2.5 MG/3ML) 0.083% nebulizer solution 2.5 mg, 2.5 mg, Nebulization, Q6H PRN, Calvert CantorSaima Rizwan, MD .  alteplase (CATHFLO ACTIVASE) injection 2 mg, 2 mg, Intracatheter, Once, Harriette Bouillonhomas Cornett, MD .  cefTRIAXone (ROCEPHIN) 2 g in dextrose 5 % 50 mL IVPB, 2 g, Intravenous, Q24H, Terri L Green, RPH, 2 g at 02/25/16 2047 .  chlorhexidine (PERIDEX) 0.12 % solution 15 mL, 15 mL, Mouth Rinse, BID, Chevis PrettyPaul Toth III, MD, 15 mL at 02/26/16 1124 .  cycloSPORINE (RESTASIS) 0.05 % ophthalmic emulsion 1 drop, 1 drop, Both Eyes, BID, Romie LeveeAlicia Thomas, MD, 1 drop  at 02/26/16 1056 .  diphenhydrAMINE (BENADRYL) injection 12.5-25 mg, 12.5-25 mg, Intravenous, Q6H PRN, Karie SodaSteven Gross, MD, 25 mg at 02/24/16 2242 .  enoxaparin (LOVENOX) injection 40 mg, 40 mg, Subcutaneous, Q24H, Emelia LoronMatthew Wakefield, MD, 40 mg at 02/25/16 2250 .  TPN (CLINIMIX-E) Adult, , Intravenous, Cyclic-See Admin Instructions **AND** fat emulsion 20 % infusion 240 mL, 240 mL, Intravenous, Cyclic-See Admin Instructions, Anh P Pham, RPH .  haloperidol lactate (HALDOL) injection 2-5 mg, 2-5 mg, Intravenous, Q6H PRN, Karie SodaSteven Gross, MD, 5 mg at 02/26/16 0455 .  hydrALAZINE (APRESOLINE) injection 5-20 mg, 5-20 mg, Intravenous, Q6H PRN, Karie SodaSteven Gross, MD .  insulin aspart (novoLOG) injection 0-9 Units, 0-9 Units, Subcutaneous, 4 times per day, Maurice MarchRachel E Jackson, [facility]PH, 1 Units at 02/26/16 0510 .  lip balm (CARMEX) ointment 1 application, 1 application, Topical, BID, Karie SodaSteven Gross, MD, 1 application at 02/26/16 1125 .  liver oil-zinc oxide (DESITIN) 40 % ointment, , Topical, BID, Ripudeep K Rai, MD .  magic mouthwash, 15 mL, Oral, QID PRN, Francine GravenElizabeth S Simaan, PA-C .  MEDLINE mouth rinse, 15 mL, Mouth Rinse, q12n4p, Chevis PrettyPaul Toth III, MD, 15 mL at 02/24/16 1600 .  menthol-cetylpyridinium (CEPACOL) lozenge 3 mg, 1 lozenge, Oral, PRN, Karie SodaSteven Gross, MD .  methocarbamol (  ROBAXIN) 500 mg in dextrose 5 % 50 mL IVPB, 500 mg, Intravenous, Q6H PRN, Karie Soda, MD, 500 mg at 02/21/16 0113 .  metoprolol (LOPRESSOR) injection 5 mg, 5 mg, Intravenous, Q6H PRN, Karie Soda, MD .  morphine (MORPHINE) 2 mg/mL PCA injection, , Intravenous, Q4H, Emelia Loron, MD .  naloxone [facility]) injection 0.4 mg, 0.4 mg, Intravenous, PRN **AND** sodium chloride flush (NS) 0.9 % injection 9 mL, 9 mL, Intravenous, PRN, Emelia Loron, MD .  nicotine (NICODERM CQ - dosed in mg/24 hr) patch 7 mg, 7 mg, Transdermal, Daily, Romie Levee, MD, 7 mg at 02/26/16 1125 .  nitroGLYCERIN (NITROSTAT) SL tablet 0.4 mg, 0.4 mg, Sublingual, Q5 min PRN,  Karie Soda, MD .  ondansetron [facility]) injection 4 mg, 4 mg, Intravenous, Q6H PRN, Emelia Loron, MD, 4 mg at 02/19/16 0523 .  pantoprazole (PROTONIX) injection 40 mg, 40 mg, Intravenous, Daily, Brooke A Miller, PA-C, 40 mg at 02/26/16 1124 .  phenol (CHLORASEPTIC) mouth spray 2 spray, 2 spray, Mouth/Throat, PRN, Karie Soda, MD .  polyvinyl alcohol (LIQUIFILM TEARS) 1.4 % ophthalmic solution 1 drop, 1 drop, Both Eyes, Q8H PRN, Chevis Pretty III, MD .  prochlorperazine (COMPAZINE) injection 5-10 mg, 5-10 mg, Intravenous, Q4H PRN, Karie Soda, MD, 10 mg at 02/18/16 1742 .  sodium chloride flush (NS) 0.9 % injection 10-40 mL, 10-40 mL, Intracatheter, PRN, April Palumbo, MD, 40 mL at 02/25/16 0400   Follow-up Information    MARTIN,MATTHEW B, MD. Schedule an appointment as soon as possible for a visit in 3 week(s).   Specialty:  General Surgery Why:  for follow up from your recent hospitalization. Contact information: [address] [place] [address] [phone]        Shelby Mattocks, NP Follow up on 02/24/2016.   Specialties:  Nurse Practitioner, Acute Care Why:  Report to [facility] Admitting to check in. Appointment at 11:00 AM. Bring additional #6 uncuffed trach with you to your appointment. Contact information: [address] [place] [address] [phone]           Signed: Joyce Copa [facility] 02/26/2016, 4:31 PM Pager: [phone] Consults: [phone] Mon-Fri 7:00 am-4:30 pm Sat-Sun 7:00 am-11:30 am

## 2016-02-24 NOTE — Progress Notes (Signed)
Physical Therapy Treatment Patient Details Name: [name] MRN: [id number] DOB: 1940/04/07 Today's Date: 02/24/2016    History of Present Illness re [facility].Dx N/V r/o ileus. S/P 02/06/16 forEXPLORATORY LAPAROTOMY, LYSIS OF ADHESIONS, SEGMENTAL SMALL BOWEL RESECTION, GASTROJEJUNOSTOMY 12/23    PT Comments    Pt progressing well with mobility.  Assisted OOB to amb a greater distance using walker and on RA.  Pt demonstrates some anxiety about walking in hallway and feels more comfortable when a second assist is following with recliner.  Pt does demonstrate limited activity tolerance and requires a seated rest break between walks.    Follow Up Recommendations  SNF;Supervision/Assistance - 24 hour;Home health PT;LTACH (pending medical and family input)     Equipment Recommendations  Rolling walker with 5" wheels    Recommendations for Other Services       Precautions / Restrictions Precautions Precautions: Fall Precaution Comments: recent LAP with mod ABD drainage, 1 drains, 1 bulb    , PCA, trach capped Restrictions Weight Bearing Restrictions: No    Mobility  Bed Mobility Overal bed mobility: Needs Assistance Bed Mobility: Supine to Sit     Supine to sit: Min guard;Min assist     General bed mobility comments: pt sitting in chair  Transfers Overall transfer level: Needs assistance Equipment used: Rolling walker (2 wheeled) Transfers: Sit to/from Stand Sit to Stand: Mod assist         General transfer comment: 25% VC's for direction and pt able to self rise with proper hand placement  Ambulation/Gait Ambulation/Gait assistance: Min assist;+2 safety/equipment Ambulation Distance (Feet): 225 Feet (75 feet x 3 with sitting rest breaks between) Assistive device: Rolling walker (2 wheeled) Gait Pattern/deviations: Step-through pattern;Decreased stride length;Drifts right/left Gait velocity: decreased   General Gait Details: pt tolerated an  increased distance.  Spouse assisted by following with recliner.  RA avg 92% and HR increases to 120's with activity.     Stairs            Wheelchair Mobility    Modified Rankin (Stroke Patients Only)       Balance                                    Cognition Arousal/Alertness: Awake/alert Behavior During Therapy: WFL for tasks assessed/performed Overall Cognitive Status: Within Functional Limits for tasks assessed                      Exercises      General Comments        Pertinent Vitals/Pain Pain Location: pt did not complain of pain during OT session    Home Living                      Prior Function            PT Goals (current goals can now be found in the care plan section) Progress towards PT goals: Progressing toward goals    Frequency    Min 3X/week      PT Plan Current plan remains appropriate    Co-evaluation             End of Session Equipment Utilized During Treatment: Gait belt Activity Tolerance: Patient tolerated treatment well Patient left: in chair;with chair alarm set;with family/visitor present     Time: 1335-1405 PT Time Calculation (min) (ACUTE ONLY): 30  min  Charges:  $Gait Training: 8-22 mins $Therapeutic Activity: 8-22 mins                    G Codes:      Rica Koyanagi  PTA [facility]  Acute  Rehab Pager      [phone]

## 2016-02-25 ENCOUNTER — Encounter (HOSPITAL_COMMUNITY): Payer: Self-pay | Admitting: Interventional Radiology

## 2016-02-25 ENCOUNTER — Inpatient Hospital Stay (HOSPITAL_COMMUNITY): Payer: Medicare Other

## 2016-02-25 DIAGNOSIS — J9621 Acute and chronic respiratory failure with hypoxia: Secondary | ICD-10-CM

## 2016-02-25 HISTORY — PX: IR GENERIC HISTORICAL: IMG1180011

## 2016-02-25 LAB — CBC
HCT: 24.8 % — ABNORMAL LOW (ref 39.0–52.0)
HEMOGLOBIN: 7.9 g/dL — AB (ref 13.0–17.0)
MCH: 28.8 pg (ref 26.0–34.0)
MCHC: 31.9 g/dL (ref 30.0–36.0)
MCV: 90.5 fL (ref 78.0–100.0)
Platelets: 585 10*3/uL — ABNORMAL HIGH (ref 150–400)
RBC: 2.74 MIL/uL — ABNORMAL LOW (ref 4.22–5.81)
RDW: 15.6 % — ABNORMAL HIGH (ref 11.5–15.5)
WBC: 10.7 10*3/uL — ABNORMAL HIGH (ref 4.0–10.5)

## 2016-02-25 LAB — GLUCOSE, CAPILLARY
GLUCOSE-CAPILLARY: 115 mg/dL — AB (ref 65–99)
Glucose-Capillary: 103 mg/dL — ABNORMAL HIGH (ref 65–99)
Glucose-Capillary: 116 mg/dL — ABNORMAL HIGH (ref 65–99)
Glucose-Capillary: 99 mg/dL (ref 65–99)

## 2016-02-25 LAB — MAGNESIUM: Magnesium: 1.7 mg/dL (ref 1.7–2.4)

## 2016-02-25 LAB — PHOSPHORUS: Phosphorus: 4.1 mg/dL (ref 2.5–4.6)

## 2016-02-25 MED ORDER — IOPAMIDOL (ISOVUE-300) INJECTION 61%
INTRAVENOUS | Status: AC
  Administered 2016-02-25: 10 mL via INTRAVENOUS
  Filled 2016-02-25: qty 50

## 2016-02-25 MED ORDER — FAT EMULSION 20 % IV EMUL
250.0000 mL | INTRAVENOUS | Status: AC
  Administered 2016-02-25: 250 mL via INTRAVENOUS
  Filled 2016-02-25: qty 250

## 2016-02-25 MED ORDER — TRACE MINERALS CR-CU-MN-SE-ZN 10-1000-500-60 MCG/ML IV SOLN
INTRAVENOUS | Status: AC
  Administered 2016-02-25: 18:00:00 via INTRAVENOUS
  Filled 2016-02-25: qty 1990

## 2016-02-25 MED ORDER — IOPAMIDOL (ISOVUE-300) INJECTION 61%
50.0000 mL | Freq: Once | INTRAVENOUS | Status: AC | PRN
  Administered 2016-02-25: 10 mL via INTRAVENOUS

## 2016-02-25 NOTE — Progress Notes (Signed)
PCCM PROGRESS NOTE  Subjective: Breathing okay.  Not having cough.  Denies neck pain.  Vital signs: BP (!) 120/42 (BP Location: Left Arm)   Pulse 96   Temp 98.1 F (36.7 C) (Oral)   Resp 18   Ht 6\' 2"  (1.88 m)   Wt 167 lb 15.9 oz (76.2 kg)   SpO2 100%   BMI 21.57 kg/m   Intake/outpt: I/O last 3 completed shifts: In: 274 [I.V.:224; IV Piggyback:50] Out: 2998 [Urine:1720; Drains:1278]  General: pleasant Neuro: follows commands HEENT: trach stoma clean, no stridor Cardiac: regular Chest: no wheeze Abd: soft Ext: no edema Skin: no rashes   CMP Latest Ref Rng & Units 02/24/2016 02/22/2016 02/20/2016  Glucose 65 - 99 mg/dL 147(W124(H) 295(A107(H) 213(Y111(H)  BUN 6 - 20 mg/dL 86(V37(H) 78(I31(H) 69(G27(H)  Creatinine 0.61 - 1.24 mg/dL 2.950.69 2.840.78 1.320.67  Sodium 135 - 145 mmol/L 135 135 135  Potassium 3.5 - 5.1 mmol/L 4.1 4.1 3.8  Chloride 101 - 111 mmol/L 105 105 104  CO2 22 - 32 mmol/L 25 23 26   Calcium 8.9 - 10.3 mg/dL 4.4(W8.7(L) 1.0(U8.6(L) 7.2(Z8.6(L)  Total Protein 6.5 - 8.1 g/dL - 6.2(L) -  Total Bilirubin 0.3 - 1.2 mg/dL - <0.1(L) -  Alkaline Phos 38 - 126 U/L - 249(H) -  AST 15 - 41 U/L - 27 -  ALT 17 - 63 U/L - 20 -    CBC Latest Ref Rng & Units 02/25/2016 02/24/2016 02/23/2016  WBC 4.0 - 10.5 K/uL 10.7(H) 11.4(H) 12.3(H)  Hemoglobin 13.0 - 17.0 g/dL 7.9(L) 8.1(L) 8.0(L)  Hematocrit 39.0 - 52.0 % 24.8(L) 25.1(L) 25.0(L)  Platelets 150 - 400 K/uL 585(H) 676(H) 681(H)    Assessment/plan:  Tracheostomy status after prolonged hospital stay. - successfully decannulated 1/10 - discuss process of trach stoma healing  PCCM will sign off.  Please call if additional help needed while he is in hospital.  Updated pt's family at bedside.  Coralyn HellingVineet Alaiya Martindelcampo, MD [facility]Critical [facility] 02/25/2016, 8:30 AM Pager:  [phone] After 3pm call: [phone]

## 2016-02-25 NOTE — Progress Notes (Signed)
Nutrition Follow-up  DOCUMENTATION CODES:   Severe malnutrition in context of acute illness/injury  INTERVENTION:   - Continue cyclic TPN per Pharmacy. - RD will continue to follow.  NUTRITION DIAGNOSIS:   Inadequate oral intake related to inability to eat as evidenced by NPO status.  Ongoing.[name].  GOAL:   Patient will meet greater than or equal to 90% of their needs  Meeting with TPN.  MONITOR:   Labs, Weight trends, Skin, I & O's, Other (Comment) (TPN, TF initiation?)  ASSESSMENT:   76 year-old male with a medical history of GERD, PUD, and CAD who presented to [facility] via EMS with a cc of nausea, vomiting, and diarrhea. Patient is s/p Robotic anterior and posterior vagotomy, BI anastomosis, DOR fundoplication, omentopexy 09/2015 for partial GOO secondary to pyloric stricture, followed by placement of feeding jejunostomy and gastrostomy tube and EGD balloon dilation of duodenal stricture 12/01/15 for recurrent GOO secondary to edema at anastomosis and failure to thrive with malnutrition. On 12/03/15 the patient was taken to the OR for a diagnostic laparoscopy, omentopexy of jejunal disruption, and washout for jejunal disruption (pt pulled out jejunostomy tube) and gastric leak.   1/11: -Pt remains NPO. -Cyclic TPN continues, now over 16 hours. -Wt stable, 167 lb since 1/8. -Trach removed 1/10 -Per surgery, plan is for drain study today via IR  Medications reviewed. Labs reviewed: CBGs: 99-115 Mg/Phos WNL  Plan per Pharmacy 1/11: At 1800 today:  Continue cyclic TPN over 16 hours: Clinimix E 5/20 at 50 mls/hr x 1 hour then increase to 135 mls/hr x 14 hours then 50 mls/hr x 1 hour then stop.  Cycle  20% fat emulsion 240mL over 16 hours.  1/8: Pt remains NPO. Consult received outlined above. Cyclic TPN to continue and remain the same as was documented by this RD on 02/19/16. Weight has been trending down since that time and is now -7.6  kg from 02/19/16.   1/5: - Pt transitioned to cyclic TPN yesterday.  - Plan per pharmacy outlined below.  - Pt will receive 1988 mL Clinimix E 5/20 + 234 mL 20% ILE per day. This will provide 2218 kcal and 99 grams of protein/day.  - Weight stable from 1/2-1/4 but now +3.4 kg from yesterday.   Diet Order:  Diet NPO time specified Except for: Ice Chips TPN (CLINIMIX-E) Adult  Skin:     Last BM:  12/25  Height:   Ht Readings from Last 1 Encounters:  01/21/16 6\' 2"  (1.88 m)    Weight:   Wt Readings from Last 1 Encounters:  02/25/16 167 lb 15.9 oz (76.2 kg)    Ideal Body Weight:  86.4 kg  BMI:  Body mass index is 21.57 kg/m.  Estimated Nutritional Needs:   Kcal:  2000-2200  Protein:  100-115g  Fluid:  2L/day  EDUCATION NEEDS:   No education needs identified at this time  Tilda FrancoLindsey Valarie Farace, MS, RD, LDN Pager: [phone] After Hours Pager: [phone]

## 2016-02-25 NOTE — Progress Notes (Signed)
Rehab admissions - I am following for potential acute inpatient rehab admission.  I have asked MD to review case for potential rehab admit.  I will await feedback from rehab MD.  Call me for questions.  #[phone]

## 2016-02-25 NOTE — Progress Notes (Signed)
Physical Therapy Treatment Patient Details Name: [name] MRN: [id number] DOB: 10-10-1940 Today's Date: 02/25/2016    History of Present Illness re admit from [facility] 12/05.Dx N/V r/o ileus. S/P 02/06/16 forEXPLORATORY LAPAROTOMY, LYSIS OF ADHESIONS, SEGMENTAL SMALL BOWEL RESECTION, GASTROJEJUNOSTOMY 12/23    PT Comments    Pt has a very supportive spouse and daughter.  Pt progressing each day and needs to cont Rehab to achieve his long term goal of home.   Follow Up Recommendations  CIR (believe pt would benefit and tolerate)     Equipment Recommendations       Recommendations for Other Services       Precautions / Restrictions Precautions Precautions: Fall Precaution Comments: recent LAP with mod ABD drainage, 1 drains, 1 bulb    , PCA, trach capped Restrictions Weight Bearing Restrictions: No    Mobility  Bed Mobility               General bed mobility comments: OOB in recliner  Transfers Overall transfer level: Needs assistance Equipment used: Rolling walker (2 wheeled) Transfers: Sit to/from Stand Sit to Stand: Min assist;Mod assist Stand pivot transfers: Min assist;Mod assist       General transfer comment: 25% VC's for direction and pt able to self rise with proper hand placement and with a count of 1, 2, 3  Ambulation/Gait Ambulation/Gait assistance: Min assist;+2 safety/equipment Ambulation Distance (Feet): 240 Feet (80 feet x 3 with sitting rest breaks between) Assistive device: Rolling walker (2 wheeled) Gait Pattern/deviations: Step-through pattern;Decreased stride length;Drifts right/left Gait velocity: decreased   General Gait Details: pt tolerated an increased distance.  Spouse assisted by following with recliner. demonstartes some anxiety about falling.    Stairs            Wheelchair Mobility    Modified Rankin (Stroke Patients Only)       Balance                                    Cognition  Arousal/Alertness: Awake/alert Behavior During Therapy: WFL for tasks assessed/performed Overall Cognitive Status: Within Functional Limits for tasks assessed                      Exercises      General Comments        Pertinent Vitals/Pain Pain Assessment: Faces Faces Pain Scale: Hurts a little bit Pain Location: ABD "not bad' Pain Descriptors / Indicators: Discomfort Pain Intervention(s): Monitored during session    Home Living                      Prior Function            PT Goals (current goals can now be found in the care plan section) Progress towards PT goals: Progressing toward goals    Frequency           PT Plan      Co-evaluation             End of Session Equipment Utilized During Treatment: Gait belt Activity Tolerance: Patient tolerated treatment well Patient left: in chair;with chair alarm set;with family/visitor present     Time: [id number] PT Time Calculation (min) (ACUTE ONLY): 25 min  Charges:  $Gait Training: 8-22 mins $Therapeutic Activity: 8-22 mins  G Codes:      Rica Koyanagi  PTA [facility]  Acute  Rehab Pager      [phone]

## 2016-02-25 NOTE — [place] FL2 (Signed)
MEDICAID FL2 LEVEL OF CARE SCREENING TOOL     IDENTIFICATION  Patient Name: [name] Birthdate: September 03, 1940 Sex: male Admission Date (Current Location): 01/19/2016  [facility] and [place] Number:  Producer, television/film/video and Address:  [facility],  [address] [place] [address]      Provider Number: [id number]  Attending Physician Name and Address:  Md Montez Morita, MD  Relative Name and Phone Number:       Current Level of Care: Hospital Recommended Level of Care: Skilled Nursing Facility Prior Approval Number:    Date Approved/Denied:   PASRR Number:  [id number])  Discharge Plan: SNF    Current Diagnoses: Patient Active Problem List   Diagnosis Date Noted  . Tracheostomy status (HCC)   . Chronic respiratory failure with hypoxia (HCC)   . Enterocutaneous fistula 02/13/2016  . Anemia of chronic disease 01/31/2016  . Stage 2 skin ulcer of sacral region 01/31/2016  . Hypomagnesemia 01/21/2016  . Hypokalemia 01/21/2016  . SBO (small bowel obstruction) 01/20/2016  . Pressure injury of skin 01/20/2016  . Tracheostomy care (HCC)   . Pleural effusion   . Gastroesophageal reflux disease 01/19/2016  . Insomnia 01/19/2016  . Aspiration pneumonia (HCC) 01/17/2016  . On total parenteral nutrition (TPN) 01/17/2016  . Metabolic acidosis 01/17/2016  . Skin ulcer of abdominal wall, limited to breakdown of skin (HCC) 01/17/2016  . Dysphagia 01/17/2016  . Generalized weakness 01/17/2016  . 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 Vitamin B12 deficiency 01/02/2016  . BPH (benign prostatic hyperplasia)   . Posterior vitreous detachment   . Salzmann's nodular dystrophy of left eye   . Hyperopia of both eyes with astigmatism and presbyopia   . Central pterygium of right eye   . Pressure ulcer of buttock, right, unstageable (HCC) 12/30/2015  . Hypernatremia 12/28/2015  . Tracheostomy in place [facility]) 12/25/2015  . Tobacco abuse 12/09/2015  . Ischemic  right index finger at tip 12/09/2015  . Gastritis 12/09/2015  . Gastric tube present (LUQ) 12/09/2015  . Acute respiratory failure with hypoxemia (HCC)   . Protein-calorie malnutrition, severe 12/04/2015  . Encephalopathy acute 12/03/2015  . Pyloric stricture 11/24/2015  . Anxiety state 09/28/2015  . Hyperlipidemia   . 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 Coronary atherosclerosis of native coronary artery 04/24/2013  . Essential hypertension, benign 04/24/2013  . Other and unspecified hyperlipidemia 04/24/2013  . Central pterygium 10/30/2012  . 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 Cataract, nuclear sclerotic senile 10/30/2012    Orientation RESPIRATION BLADDER Height & Weight     Self, Place  Normal Incontinent, External catheter Weight: 167 lb 15.9 oz (76.2 kg) Height:  6\' 2"  (188 cm)  BEHAVIORAL SYMPTOMS/MOOD NEUROLOGICAL BOWEL NUTRITION STATUS   (NONE )  (NONE ) Incontinent Feeding tube (PEG )  AMBULATORY STATUS COMMUNICATION OF NEEDS Skin   Limited Assist Verbally Surgical wounds     PU Stage 3 Dressing: No Dressing                 Personal Care Assistance Level of Assistance  Total care Bathing Assistance: Maximum assistance     Total Care Assistance: Maximum assistance   Functional Limitations Info  Speech, Sight, Hearing Sight Info: Impaired Hearing Info: Adequate Speech Info: Adequate    SPECIAL CARE FACTORS FREQUENCY  PT (By licensed PT), OT (By licensed OT)     PT Frequency: 3 OT Frequency: 2  Contractures      Additional Factors Info  Code Status, Allergies Code Status Info: FULL CODE  Allergies Info: Flomax Tamsulosin Hcl, Lisinopril, Lorazepam, Uroxatral Alfuzosin Hcl Er           Current Medications (02/25/2016):  This is the current hospital active medication list Current Facility-Administered Medications  Medication Dose Route Frequency  Provider Last Rate Last Dose  . 0.9 %  sodium chloride infusion   Intravenous Once Elisabeth CaraLacey A Armistead, CRNA      . albuterol (PROVENTIL) (2.5 MG/3ML) 0.083% nebulizer solution 2.5 mg  2.5 mg Nebulization Q6H PRN Calvert CantorSaima Rizwan, MD      . alteplase (CATHFLO ACTIVASE) injection 2 mg  2 mg Intracatheter Once Marshall & Ilsleyhomas Cornett, MD      . cefTRIAXone (ROCEPHIN) 2 g in dextrose 5 % 50 mL IVPB  2 g Intravenous Q24H Otho Bellowserri L Green, RPH   2 g at 02/24/16 2027  . chlorhexidine (PERIDEX) 0.12 % solution 15 mL  15 mL Mouth Rinse BID Chevis PrettyPaul Toth III, MD   15 mL at 02/25/16 1043  . cycloSPORINE (RESTASIS) 0.05 % ophthalmic emulsion 1 drop  1 drop Both Eyes BID Romie LeveeAlicia Thomas, MD   1 drop at 02/25/16 1016  . diphenhydrAMINE (BENADRYL) injection 12.5-25 mg  12.5-25 mg Intravenous Q6H PRN Karie SodaSteven Gross, MD   25 mg at 02/24/16 2242  . enoxaparin (LOVENOX) injection 40 mg  40 mg Subcutaneous Q24H Emelia LoronMatthew Wakefield, MD   40 mg at 02/24/16 2242  . TPN (CLINIMIX-E) Adult   Intravenous Cyclic-See Admin Instructions Karie SodaSteven Gross, MD       And  . fat emulsion 20 % infusion 250 mL  250 mL Intravenous Cyclic-See Admin Instructions Karie SodaSteven Gross, MD      . haloperidol lactate (HALDOL) injection 2-5 mg  2-5 mg Intravenous Q6H PRN Karie SodaSteven Gross, MD   5 mg at 02/25/16 0106  . hydrALAZINE (APRESOLINE) injection 5-20 mg  5-20 mg Intravenous Q6H PRN Karie SodaSteven Gross, MD      . insulin aspart (novoLOG) injection 0-9 Units  0-9 Units Subcutaneous 4 times per day Maurice MarchRachel E Jackson, [facility] IncRPH   1 Units at 02/24/16 0534  . lip balm (CARMEX) ointment 1 application  1 application Topical BID Karie SodaSteven Gross, MD   1 application at 02/25/16 1000  . liver oil-zinc oxide (DESITIN) 40 % ointment   Topical BID Ripudeep K Rai, MD      . magic mouthwash  15 mL Oral QID PRN Adam PhenixElizabeth S Simaan, PA-C      . MEDLINE mouth rinse  15 mL Mouth Rinse q12n4p Chevis PrettyPaul Toth III, MD   15 mL at 02/24/16 1600  . menthol-cetylpyridinium (CEPACOL) lozenge 3 mg  1 lozenge Oral PRN Karie SodaSteven  Gross, MD      . methocarbamol (ROBAXIN) 500 mg in dextrose 5 % 50 mL IVPB  500 mg Intravenous Q6H PRN Karie SodaSteven Gross, MD   500 mg at 02/21/16 0113  . metoprolol (LOPRESSOR) injection 5 mg  5 mg Intravenous Q6H PRN Karie SodaSteven Gross, MD      . morphine (MORPHINE) 2 mg/mL PCA injection   Intravenous Q4H Emelia LoronMatthew Wakefield, MD      . naloxone [facility](NARCAN) injection 0.4 mg  0.4 mg Intravenous PRN Emelia LoronMatthew Wakefield, MD       And  . sodium chloride flush (NS) 0.9 % injection 9 mL  9 mL Intravenous PRN Emelia LoronMatthew Wakefield, MD      . nicotine (NICODERM CQ - dosed in mg/24 hr) patch 7  mg  7 mg Transdermal Daily Romie Levee, MD   7 mg at 02/25/16 1016  . nitroGLYCERIN (NITROSTAT) SL tablet 0.4 mg  0.4 mg Sublingual Q5 min PRN Karie Soda, MD      . ondansetron [facility]) injection 4 mg  4 mg Intravenous Q6H PRN Emelia Loron, MD   4 mg at 02/19/16 0523  . pantoprazole (PROTONIX) injection 40 mg  40 mg Intravenous Daily Edson Snowball, PA-C   40 mg at 02/25/16 1043  . phenol (CHLORASEPTIC) mouth spray 2 spray  2 spray Mouth/Throat PRN Karie Soda, MD      . polyvinyl alcohol (LIQUIFILM TEARS) 1.4 % ophthalmic solution 1 drop  1 drop Both Eyes Q8H PRN Chevis Pretty III, MD      . prochlorperazine (COMPAZINE) injection 5-10 mg  5-10 mg Intravenous Q4H PRN Karie Soda, MD   10 mg at 02/18/16 1742  . sodium chloride flush (NS) 0.9 % injection 10-40 mL  10-40 mL Intracatheter PRN April Palumbo, MD   40 mL at 02/25/16 0400     Discharge Medications: Please see discharge summary for a list of discharge medications.  Relevant Imaging Results:  Relevant Lab Results:   Additional Information SSN [id number]   Derenda Fennel, MSW [phone] 02/25/2016 12:45 PM

## 2016-02-25 NOTE — Progress Notes (Signed)
Occupational Therapy Treatment Patient Details Name: [name] MRN: [id number] DOB: July 20, 1940 Today's Date: 02/25/2016    [facility] of [facility]Dx N/V r/o ileus. S/P 02/06/16 forEXPLORATORY LAPAROTOMY, LYSIS OF ADHESIONS, SEGMENTAL SMALL BOWEL RESECTION, GASTROJEJUNOSTOMY 12/23   OT comments  Spoke with [name]- from rehab admission who is going to open the case as pt would benefit from this prior to DC home  Follow Up Recommendations  CIR          Precautions / Restrictions Precautions Precautions: Fall Precaution Comments: recent LAP with mod ABD drainage, 1 drains, 1 bulb    , PCA, trach capped Restrictions Weight Bearing Restrictions: No       Mobility Bed Mobility               General bed mobility comments: pt on toilet  Transfers Overall transfer level: Needs assistance Equipment used: Rolling walker (2 wheeled) Transfers: Sit to/from Stand Sit to Stand: Min assist;Mod assist Stand pivot transfers: Min assist;Mod assist                ADL Overall ADL's : Needs assistance/impaired Eating/Feeding: Set up Eating/Feeding Details (indicate cue type and reason): pt can have a little water every hour Grooming: Sitting;Minimal assistance                   Toilet Transfer: Cueing for safety;RW;Minimal assistance Toilet Transfer Details (indicate cue type and reason): Pt was on BSC and walker to recliner on the other side of the bed with increased time and encouragement Toileting- Clothing Manipulation and Hygiene: Maximal assistance;Sit to/from stand;Cueing for safety;Cueing for sequencing         General ADL Comments: Rehab admissions going to look at pts chart                Cognition   Behavior During Therapy: [facility] LtdWFL for tasks assessed/performed Overall Cognitive Status: Within Functional Limits for tasks assessed                         Exercises   Encouraged pt to relax and depress  shoulders as well as perform gentle neck ROM  Shoulder Instructions            Pertinent Vitals/ Pain       Pain Location: pt did not complain of pain during OT session         Frequency  Min 3X/week        Progress Toward Goals  OT Goals(current goals can now be found in the care plan section)  Progress towards OT goals: Progressing toward goals     Plan Discharge plan needs to be updated    Co-evaluation                 End of Session Equipment Utilized During Treatment: Rolling walker   Activity Tolerance Patient tolerated treatment well   Patient Left in chair   Nurse Communication Mobility status        Time: 1115-1140 OT Time Calculation (min): 25 min  Charges: OT General Charges $OT Visit: 1 Procedure OT Treatments $Self Care/Home Management : 23-37 mins  [name], Metro KungLorraine [name] 02/25/2016, 12:40 PM

## 2016-02-25 NOTE — Progress Notes (Signed)
[facility] Progress Note  19 Days Post-Op  Subjective: Pt is complaining of hip pain which is chronic according to the daughter. He states his abdominal pain is a little worse today than yesterday. No additional complaints. Wife and daughter at bedside state they do not want him to go to [facility]. I discussed the IR will do a drain study today with possibility of removing the drain.  Objective: Vital signs in last 24 hours: Temp:  [97.3 F (36.3 C)-98.5 F (36.9 C)] 98.1 F (36.7 C) (01/11 0537) Pulse Rate:  [66-102] 96 (01/11 0537) Resp:  [16-18] 18 (01/11 0800) BP: (120-152)/(42-77) 120/42 (01/11 0537) SpO2:  [95 %-100 %] 100 % (01/11 0800) FiO2 (%):  [28 %-34 %] 34 % (01/10 2027) Weight:  [167 lb 15.9 oz (76.2 kg)] 167 lb 15.9 oz (76.2 kg) (01/11 0400) Last BM Date: 02/08/16  Intake/Output from previous day: 01/10 0701 - 01/11 0700 In: 0  Out: 1658 [Urine:820; Drains:838] Intake/Output this shift: No intake/output data recorded.  PE: Gen: Alert, NAD, pleasant, cooperative, lying in bed  Card: RRR, no M/G/R heard Pulm: effort normal, CTA bilaterally, no wheezes, rales, or rhonchi noted.  Abd: Soft, non distended, +BS, Eakin's pouch in place with feculent looking material in pouch, IR drain with scant purulent drainage and Gtube with bilious drainage. Black eschar noted around G tube with tenderness no blood or erythema noted. Generalized abdominal TTP. Skin: no rashes noted, warm and dry, pale  Lab Results:   Recent Labs  02/24/16 0519 02/25/16 0351  WBC 11.4* 10.7*  HGB 8.1* 7.9*  HCT 25.1* 24.8*  PLT 676* 585*   BMET  Recent Labs  02/24/16 0519  NA 135  K 4.1  CL 105  CO2 25  GLUCOSE 124*  BUN 37*  CREATININE 0.69  CALCIUM 8.7*   PT/INR No results for input(s): LABPROT, INR in the last 72 hours. CMP     Component Value Date/Time   NA 135 02/24/2016 0519   NA 139 01/14/2016   K 4.1 02/24/2016 0519   CL 105 02/24/2016 0519   CO2 25  02/24/2016 0519   GLUCOSE 124 (H) 02/24/2016 0519   BUN 37 (H) 02/24/2016 0519   BUN 23 (A) 01/14/2016   CREATININE 0.69 02/24/2016 0519   CALCIUM 8.7 (L) 02/24/2016 0519   PROT 6.2 (L) 02/22/2016 0451   ALBUMIN 1.4 (L) 02/22/2016 0451   AST 27 02/22/2016 0451   ALT 20 02/22/2016 0451   ALKPHOS 249 (H) 02/22/2016 0451   BILITOT <0.1 (L) 02/22/2016 0451   GFRNONAA >60 02/24/2016 0519   GFRAA >60 02/24/2016 0519   Lipase     Component Value Date/Time   LIPASE 55 (H) 01/20/2016 0158       Studies/Results: No results found.  Anti-infectives: Anti-infectives    Start     Dose/Rate Route Frequency Ordered Stop   02/17/16 2000  cefTRIAXone (ROCEPHIN) 2 g in dextrose 5 % 50 mL IVPB     2 g 100 mL/hr over 30 Minutes Intravenous Every 24 hours 02/17/16 1434     02/12/16 2200  vancomycin (VANCOCIN) 1,500 mg in sodium chloride 0.9 % 500 mL IVPB  Status:  Discontinued     1,500 mg 250 mL/hr over 120 Minutes Intravenous Every 24 hours 02/12/16 1618 02/16/16 1340   02/09/16 0200  vancomycin (VANCOCIN) IVPB 1000 mg/200 mL premix  Status:  Discontinued     1,000 mg 200 mL/hr over 60 Minutes Intravenous Every 12 hours 02/08/16  1300 02/12/16 1359   02/08/16 1400  vancomycin (VANCOCIN) IVPB 1000 mg/200 mL premix  Status:  Discontinued     1,000 mg 200 mL/hr over 60 Minutes Intravenous Every 12 hours 02/08/16 1259 02/08/16 1300   02/08/16 1300  vancomycin (VANCOCIN) IVPB 1000 mg/200 mL premix     1,000 mg 200 mL/hr over 60 Minutes Intravenous  Once 02/08/16 1217 02/08/16 1336   02/08/16 1200  piperacillin-tazobactam (ZOSYN) IVPB 3.375 g  Status:  Discontinued     3.375 g 12.5 mL/hr over 240 Minutes Intravenous Every 8 hours 02/08/16 1136 02/17/16 1433   02/06/16 1415  piperacillin-tazobactam (ZOSYN) IVPB 3.375 g     3.375 g 100 mL/hr over 30 Minutes Intravenous  Once 02/06/16 1413 02/06/16 1409   01/26/16 2300  piperacillin-tazobactam (ZOSYN) IVPB 3.375 g  Status:  Discontinued      3.375 g 12.5 mL/hr over 240 Minutes Intravenous Every 8 hours 01/26/16 2256 02/04/16 0905   01/26/16 2230  vancomycin (VANCOCIN) IVPB 1000 mg/200 mL premix  Status:  Discontinued     1,000 mg 200 mL/hr over 60 Minutes Intravenous Every 12 hours 01/26/16 2227 01/26/16 2227   01/20/16 0400  vancomycin (VANCOCIN) IVPB 1000 mg/200 mL premix  Status:  Discontinued     1,000 mg 200 mL/hr over 60 Minutes Intravenous Every 12 hours 01/20/16 0323 01/21/16 0900   01/20/16 0330  ceFEPIme (MAXIPIME) 1 g in dextrose 5 % 50 mL IVPB     1 g 100 mL/hr over 30 Minutes Intravenous  Once 01/20/16 0317 01/20/16 0735       Assessment/Plan  PSBO with 2 days of nausea and vomiting on readmit 01/20/16 prior procedures above S/p EXPLORATORY LAPAROTOMY, LYSIS OF ADHESIONS, SEGMENTAL SMALL BOWEL RESECTION, GASTROJEJUNOSTOMY , 02/06/16, Dr. Chevis PrettyPaul Toth G tube dislodged 02/10/16, bed tansfer New EC fistula midline 02/13/16/CT shows LLQ intraabdominal abscess CT guided LLQabdominal abscess drainage, 02/14/16; IR Dr. Fredia SorrowYamagata Post op Ventricular tachycardia Tracheostomy - removed 02/24/16 Aspiration pneumonia/pleural effusion Malnutrition/deconditioning Anemia  Stage II sacral decubitus Malnutrition formerly on TF/now on TNA  FEN: NPO/TNA  ID: Zosyn 02/06/16 thru 02/17/16; Vancomycin 02/08/16 thru 02/15/16; Rocephin started 02/17/16 =>>day 8 DVT: Heparin/SCD   Appreciated IR's recommendations: no additional drains needed at this time; collections smaller in size;  cont current tx/drain irrigation; will need drain injection before removal, f/u CT later this week, Marisue IvanLiz spoke with Jeananne RamaKevin Allred who stated they will add the pt to their list today for a drain study.  Plan: Continue TPN, monitor fistula output. G tube output decreased from day before. Continue supportive care.  WBC trending down.Hg stable. Trach removed. [facility] and possible debridement bedside. IR will perform drain study today and  possible removal of perc drain. Dispo pending placement.   LOS: 36 days    Jerre SimonJessica L Darlean Warmoth , [facility] 02/25/2016, 9:20 AM Pager: [phone] Consults: [phone] Mon-Fri 7:00 am-4:30 pm Sat-Sun 7:00 am-11:30 am

## 2016-02-25 NOTE — Progress Notes (Signed)
PHARMACY - ADULT TOTAL PARENTERAL NUTRITION CONSULT NOTE   Pharmacy Consult for TPN Indication: PTA TPN continuation; bowel perforation  Insulin Requirements:  SSI:  no sensitive SSI needed in past 24 hours TPN: 10 units regular insulin per day in TPN   Current Nutrition:  - NPO - cyclic TPN  IVF:  none  Central access: PICC TPN start date: PTA, restarted on admission on 12/6    Recent Labs  02/24/16 0519 02/25/16 0351  NA 135  --   K 4.1  --   CL 105  --   CO2 25  --   GLUCOSE 124*  --   BUN 37*  --   CREATININE 0.69  --   CALCIUM 8.7*  --   PHOS  --  4.1  MG  --  1.7  Corr Ca: 10.2 on 12/30  ASSESSMENT                                                                                                          HPI: 82 yoM with hx PUD, CAD, GERD, BPH,  s/p multiple procedures  and extended hospitalization for recurrent partial GOO 2/2 pyloric stricture on TPN for several days, discharged 11/15 with J/G tube in place on TFs, unable to tolerate and had to transition back to TPN via [facility] on 11/22, readmitted 12/6 with partial SBO. Pharmacy consulted to resume TPN. Note: PTA TNA from [place] was as follows: Totals per day: 405 grams (1376 kcal)  of dextrose, 75 grams (300 kcal) of protein and 50 grams ( 480 kcal) of lipid for a total of 2156 kcal/day   Significant events:  12/6: TPN continued on admission.  12/7: Pt pulled out NGT, not replaced, remains NPO 12/11: Awaiting MBS, if no aspiration, will start to advance diet.  Having bowel function.  Continue TPN for now.   12/12: Severe aspiration risk per MBS yesterday - recommend continuing NPO. Osmolite 1.2 CAL via TF BID ordered 12/11 at 6pm. Per discussion with CCS PA, continue TPN at current goal rate today, wean tomorrow if tolerates TF. 12/13: Did not tolerate bolus feeds through G-tube; overnight emesis and aspiration event, started on Zosyn. CCS trying trickle feeds today. 12/14 transition to cyclic TPN -  appears recovery may be prolonged. Not tolerating trickle feeds; G-tube to drainage.  12/15: TPN was not run at cyclic rate overnight d/t conflicting information in TPN order. To provide patient full needed nutrition, will continue to run TPN at 83 ml/hr over 24 hr and begin 18-hr cycle with tonight's TPN 12/16: start 12h TPN cycle 12/18: resume trickle tube feed at 5 ml/hr 12/19: Nausea/vomiting, tube feed held. TPN formula changed to E 5/20 from E 5/15 12/21: successful transition of G tube to [place] tube. Plan scan for patency before resuming tube feeds.  12/22: Upper GI scan in IR today to ensure J-tube working.  12/23: exp lap, lysis of adhesions, segmental small bowel resection, gastrojejunostomy 12/25: TPN converted from cyclic back to continuous due to hypoglycemic event following end of TPN cycle  and patient likely to remain hospitalized for at least several more days.  Prealbumin low, falling - Fat Emulsion increased to 10 mL/hr.  Clinimix already at 2L/day. 12/28: to start trickle feeds thru J-tube; G-tube remains on drainage 12/29: TF increased from 10 ml/hr to 20 ml/hr  12/29 PM: TF was found draining from midline incision, CT abdomen c/w bowel perforation at G-J suture site; interval development of abscesses.  TF was stopped and is to remain off until MD orders otherwise. 12/31: percutaneous drainage of LLQ fluid collection. 1/3: EC Fistula - feculent material from drain placed 12/31 after perc drainage  & in midline wound.  1/4: Changed to cyclic TPN over 18 hours 1/8: cycle TPN over 16 hours  Today, 02/25/2016:   Glucose: within goal (100-150)  Electrolytes:  WNL   Renal: SCr WNL, BUN elevated    LFTs:  Alk phos elevated but decreased (1/8).  All others < ULN.  TGs (12/24) 134, (1/1) 145, 146 (1/8)  Prealbumin:  15.3 ( 11/13), 10.8 (12/7), 12.8 (12/11), 13.1 (12/18), 8.9 (12/25), 10 (1/1), 14 (1/8)  Total drain output (PEG/jejunostomy & JP): decreased to 385 ml/24 hr    NUTRITIONAL GOALS                                                                                             RD recs (12/26): Kcal 2000-2200, Protein 100-115g, Fluid 2L/day [facility] TPN: 2156 Kcal/day, Protein 75g/day  Clinimix E 5/20 2000 ml/day + 20% fat emulsion 240 ml/day: 100 g/day protein, 2240 Kcal/day. (+++There is currently a Psychologist, prison and probation services of Clinimix solution.  Pharmacy will use Clinimix product based on what we have available in stock+++)  PLAN                                                                                   At 1800 today:  Continue cyclic TPN over 16 hours: Clinimix E 5/20 at 50 mls/hr x 1 hour then increase to 135 mls/hr x 14 hours then 50 mls/hr x 1 hour then stop.  Cycle  20% fat emulsion 246m over 16 hours.  Continue 10 units regular insulin per day in TPN.   Continue standard multivitamins and trace elements in TPN.  Continue sensitive SSI and CBG checks q6h.  TPN lab panels on Mondays & Thursdays  Follow clinical course daily.   EDolly RiasRPh 02/25/2016, 9:49 AM Pager [phone]

## 2016-02-25 NOTE — Clinical Social Work Note (Signed)
Per RNCM, [facility]   MSW to extend SNF search due to removal of trach.   No further concerns reported at this time.   Derenda FennelBashira Jedediah Noda, MSW [phone] 02/25/2016 12:37 PM

## 2016-02-26 ENCOUNTER — Encounter (HOSPITAL_COMMUNITY): Payer: Self-pay | Admitting: *Deleted

## 2016-02-26 ENCOUNTER — Inpatient Hospital Stay (HOSPITAL_COMMUNITY)
Admission: RE | Admit: 2016-02-26 | Discharge: 2016-03-30 | DRG: 947 | Disposition: A | Discharge status: Home Health | Payer: Medicare Other | Source: Intra-hospital | Attending: Physical Medicine & Rehabilitation | Admitting: Physical Medicine & Rehabilitation

## 2016-02-26 DIAGNOSIS — T424X5A Adverse effect of benzodiazepines, initial encounter: Secondary | ICD-10-CM | POA: Diagnosis not present

## 2016-02-26 DIAGNOSIS — K632 Fistula of intestine: Secondary | ICD-10-CM | POA: Diagnosis present

## 2016-02-26 DIAGNOSIS — R627 Adult failure to thrive: Secondary | ICD-10-CM | POA: Diagnosis present

## 2016-02-26 DIAGNOSIS — F411 Generalized anxiety disorder: Secondary | ICD-10-CM | POA: Diagnosis present

## 2016-02-26 DIAGNOSIS — Z930 Tracheostomy status: Secondary | ICD-10-CM

## 2016-02-26 DIAGNOSIS — J449 Chronic obstructive pulmonary disease, unspecified: Secondary | ICD-10-CM | POA: Diagnosis present

## 2016-02-26 DIAGNOSIS — Z934 Other artificial openings of gastrointestinal tract status: Secondary | ICD-10-CM | POA: Diagnosis not present

## 2016-02-26 DIAGNOSIS — R443 Hallucinations, unspecified: Secondary | ICD-10-CM | POA: Diagnosis not present

## 2016-02-26 DIAGNOSIS — I159 Secondary hypertension, unspecified: Secondary | ICD-10-CM | POA: Diagnosis not present

## 2016-02-26 DIAGNOSIS — R1312 Dysphagia, oropharyngeal phase: Secondary | ICD-10-CM | POA: Diagnosis present

## 2016-02-26 DIAGNOSIS — E43 Unspecified severe protein-calorie malnutrition: Secondary | ICD-10-CM | POA: Diagnosis present

## 2016-02-26 DIAGNOSIS — D638 Anemia in other chronic diseases classified elsewhere: Secondary | ICD-10-CM | POA: Diagnosis present

## 2016-02-26 DIAGNOSIS — R739 Hyperglycemia, unspecified: Secondary | ICD-10-CM | POA: Diagnosis not present

## 2016-02-26 DIAGNOSIS — K3184 Gastroparesis: Secondary | ICD-10-CM | POA: Diagnosis present

## 2016-02-26 DIAGNOSIS — K2970 Gastritis, unspecified, without bleeding: Secondary | ICD-10-CM | POA: Diagnosis present

## 2016-02-26 DIAGNOSIS — K651 Peritoneal abscess: Secondary | ICD-10-CM | POA: Diagnosis present

## 2016-02-26 DIAGNOSIS — K3189 Other diseases of stomach and duodenum: Secondary | ICD-10-CM

## 2016-02-26 DIAGNOSIS — R1313 Dysphagia, pharyngeal phase: Secondary | ICD-10-CM | POA: Diagnosis not present

## 2016-02-26 DIAGNOSIS — R471 Dysarthria and anarthria: Secondary | ICD-10-CM | POA: Diagnosis present

## 2016-02-26 DIAGNOSIS — T8183XA Persistent postprocedural fistula, initial encounter: Secondary | ICD-10-CM | POA: Diagnosis not present

## 2016-02-26 DIAGNOSIS — R1310 Dysphagia, unspecified: Secondary | ICD-10-CM

## 2016-02-26 DIAGNOSIS — Z9049 Acquired absence of other specified parts of digestive tract: Secondary | ICD-10-CM

## 2016-02-26 DIAGNOSIS — J9811 Atelectasis: Secondary | ICD-10-CM | POA: Diagnosis present

## 2016-02-26 DIAGNOSIS — Z9889 Other specified postprocedural states: Secondary | ICD-10-CM

## 2016-02-26 DIAGNOSIS — Z789 Other specified health status: Secondary | ICD-10-CM

## 2016-02-26 DIAGNOSIS — N179 Acute kidney failure, unspecified: Secondary | ICD-10-CM | POA: Diagnosis not present

## 2016-02-26 DIAGNOSIS — Y838 Other surgical procedures as the cause of abnormal reaction of the patient, or of later complication, without mention of misadventure at the time of the procedure: Secondary | ICD-10-CM | POA: Diagnosis not present

## 2016-02-26 DIAGNOSIS — E46 Unspecified protein-calorie malnutrition: Secondary | ICD-10-CM

## 2016-02-26 DIAGNOSIS — K311 Adult hypertrophic pyloric stenosis: Secondary | ICD-10-CM | POA: Diagnosis present

## 2016-02-26 DIAGNOSIS — Z7982 Long term (current) use of aspirin: Secondary | ICD-10-CM

## 2016-02-26 DIAGNOSIS — E860 Dehydration: Secondary | ICD-10-CM | POA: Diagnosis present

## 2016-02-26 DIAGNOSIS — E871 Hypo-osmolality and hyponatremia: Secondary | ICD-10-CM | POA: Diagnosis not present

## 2016-02-26 DIAGNOSIS — G479 Sleep disorder, unspecified: Secondary | ICD-10-CM

## 2016-02-26 DIAGNOSIS — Z681 Body mass index (BMI) 19 or less, adult: Secondary | ICD-10-CM

## 2016-02-26 DIAGNOSIS — Z434 Encounter for attention to other artificial openings of digestive tract: Secondary | ICD-10-CM

## 2016-02-26 DIAGNOSIS — Z8711 Personal history of peptic ulcer disease: Secondary | ICD-10-CM

## 2016-02-26 DIAGNOSIS — D62 Acute posthemorrhagic anemia: Secondary | ICD-10-CM | POA: Diagnosis present

## 2016-02-26 DIAGNOSIS — I959 Hypotension, unspecified: Secondary | ICD-10-CM | POA: Diagnosis not present

## 2016-02-26 DIAGNOSIS — I2510 Atherosclerotic heart disease of native coronary artery without angina pectoris: Secondary | ICD-10-CM | POA: Diagnosis present

## 2016-02-26 DIAGNOSIS — G9340 Encephalopathy, unspecified: Secondary | ICD-10-CM | POA: Diagnosis present

## 2016-02-26 DIAGNOSIS — F419 Anxiety disorder, unspecified: Secondary | ICD-10-CM

## 2016-02-26 DIAGNOSIS — D72829 Elevated white blood cell count, unspecified: Secondary | ICD-10-CM

## 2016-02-26 DIAGNOSIS — F418 Other specified anxiety disorders: Secondary | ICD-10-CM | POA: Diagnosis not present

## 2016-02-26 DIAGNOSIS — Z955 Presence of coronary angioplasty implant and graft: Secondary | ICD-10-CM

## 2016-02-26 DIAGNOSIS — R4189 Other symptoms and signs involving cognitive functions and awareness: Secondary | ICD-10-CM | POA: Diagnosis present

## 2016-02-26 DIAGNOSIS — Z888 Allergy status to other drugs, medicaments and biological substances status: Secondary | ICD-10-CM

## 2016-02-26 DIAGNOSIS — E8809 Other disorders of plasma-protein metabolism, not elsewhere classified: Secondary | ICD-10-CM

## 2016-02-26 DIAGNOSIS — L89152 Pressure ulcer of sacral region, stage 2: Secondary | ICD-10-CM | POA: Diagnosis not present

## 2016-02-26 DIAGNOSIS — R5381 Other malaise: Principal | ICD-10-CM | POA: Diagnosis present

## 2016-02-26 DIAGNOSIS — G4700 Insomnia, unspecified: Secondary | ICD-10-CM | POA: Diagnosis present

## 2016-02-26 DIAGNOSIS — Z79899 Other long term (current) drug therapy: Secondary | ICD-10-CM

## 2016-02-26 DIAGNOSIS — N400 Enlarged prostate without lower urinary tract symptoms: Secondary | ICD-10-CM | POA: Diagnosis present

## 2016-02-26 DIAGNOSIS — F329 Major depressive disorder, single episode, unspecified: Secondary | ICD-10-CM

## 2016-02-26 DIAGNOSIS — I951 Orthostatic hypotension: Secondary | ICD-10-CM

## 2016-02-26 DIAGNOSIS — Z6820 Body mass index (BMI) 20.0-20.9, adult: Secondary | ICD-10-CM

## 2016-02-26 DIAGNOSIS — K219 Gastro-esophageal reflux disease without esophagitis: Secondary | ICD-10-CM | POA: Diagnosis present

## 2016-02-26 DIAGNOSIS — T85528A Displacement of other gastrointestinal prosthetic devices, implants and grafts, initial encounter: Secondary | ICD-10-CM

## 2016-02-26 DIAGNOSIS — K9429 Other complications of gastrostomy: Secondary | ICD-10-CM | POA: Diagnosis not present

## 2016-02-26 DIAGNOSIS — Z87891 Personal history of nicotine dependence: Secondary | ICD-10-CM

## 2016-02-26 DIAGNOSIS — L98499 Non-pressure chronic ulcer of skin of other sites with unspecified severity: Secondary | ICD-10-CM | POA: Diagnosis present

## 2016-02-26 HISTORY — DX: Pneumonitis due to inhalation of food and vomit: J69.0

## 2016-02-26 HISTORY — DX: Chronic respiratory failure with hypoxia: J96.11

## 2016-02-26 HISTORY — DX: Unspecified intestinal obstruction, unspecified as to partial versus complete obstruction: K56.609

## 2016-02-26 HISTORY — DX: Other disorder of circulatory system: I99.8

## 2016-02-26 HISTORY — DX: Adult hypertrophic pyloric stenosis: K31.1

## 2016-02-26 LAB — CBC
HCT: 24.2 % — ABNORMAL LOW (ref 39.0–52.0)
Hemoglobin: 7.7 g/dL — ABNORMAL LOW (ref 13.0–17.0)
MCH: 28.4 pg (ref 26.0–34.0)
MCHC: 31.8 g/dL (ref 30.0–36.0)
MCV: 89.3 fL (ref 78.0–100.0)
PLATELETS: 622 10*3/uL — AB (ref 150–400)
RBC: 2.71 MIL/uL — ABNORMAL LOW (ref 4.22–5.81)
RDW: 15.5 % (ref 11.5–15.5)
WBC: 10.1 10*3/uL (ref 4.0–10.5)

## 2016-02-26 LAB — COMPREHENSIVE METABOLIC PANEL
ALK PHOS: 204 U/L — AB (ref 38–126)
ALT: 22 U/L (ref 17–63)
ANION GAP: 6 (ref 5–15)
AST: 30 U/L (ref 15–41)
Albumin: 1.6 g/dL — ABNORMAL LOW (ref 3.5–5.0)
BUN: 33 mg/dL — ABNORMAL HIGH (ref 6–20)
CALCIUM: 8.7 mg/dL — AB (ref 8.9–10.3)
CHLORIDE: 105 mmol/L (ref 101–111)
CO2: 25 mmol/L (ref 22–32)
CREATININE: 0.61 mg/dL (ref 0.61–1.24)
Glucose, Bld: 118 mg/dL — ABNORMAL HIGH (ref 65–99)
Potassium: 3.9 mmol/L (ref 3.5–5.1)
Sodium: 136 mmol/L (ref 135–145)
Total Bilirubin: 0.2 mg/dL — ABNORMAL LOW (ref 0.3–1.2)
Total Protein: 6.7 g/dL (ref 6.5–8.1)

## 2016-02-26 LAB — GLUCOSE, CAPILLARY
GLUCOSE-CAPILLARY: 134 mg/dL — AB (ref 65–99)
GLUCOSE-CAPILLARY: 103 mg/dL — AB (ref 65–99)
GLUCOSE-CAPILLARY: 89 mg/dL (ref 65–99)

## 2016-02-26 MED ORDER — DIPHENHYDRAMINE HCL 50 MG/ML IJ SOLN: 12.5000 mg | Freq: Four times a day (QID) | INTRAMUSCULAR | 0 refills | Status: DC | PRN

## 2016-02-26 MED ORDER — HALOPERIDOL LACTATE 5 MG/ML IJ SOLN
2.5000 mg | Freq: Four times a day (QID) | INTRAMUSCULAR | Status: DC | PRN
  Administered 2016-03-01: 2.5 mg via INTRAVENOUS
  Filled 2016-02-26: qty 1

## 2016-02-26 MED ORDER — FAT EMULSION 20 % IV EMUL
240.0000 mL | INTRAVENOUS | Status: DC
  Administered 2016-02-26: 240 mL via INTRAVENOUS
  Filled 2016-02-26: qty 250

## 2016-02-26 MED ORDER — FAT EMULSION 20 % IV EMUL
240.0000 mL | INTRAVENOUS | Status: AC
  Filled 2016-02-26: qty 250

## 2016-02-26 MED ORDER — FAT EMULSION 20 % IV EMUL: 240.0000 mL | INTRAVENOUS | Status: DC

## 2016-02-26 MED ORDER — ALTEPLASE 2 MG IJ SOLR: 2.0000 mg | Freq: Once | INTRAMUSCULAR | Status: DC

## 2016-02-26 MED ORDER — HALOPERIDOL LACTATE 5 MG/ML IJ SOLN: 2.0000 mg | Freq: Four times a day (QID) | INTRAMUSCULAR | Status: DC | PRN

## 2016-02-26 MED ORDER — ZINC OXIDE 40 % EX OINT: TOPICAL_OINTMENT | Freq: Two times a day (BID) | CUTANEOUS | 0 refills | Status: DC

## 2016-02-26 MED ORDER — CYCLOSPORINE 0.05 % OP EMUL
1.0000 [drp] | Freq: Two times a day (BID) | OPHTHALMIC | Status: DC
  Administered 2016-02-26 – 2016-03-30 (×66): 1 [drp] via OPHTHALMIC
  Filled 2016-02-26 (×66): qty 1

## 2016-02-26 MED ORDER — NITROGLYCERIN 0.4 MG SL SUBL: 0.4000 mg | SUBLINGUAL_TABLET | SUBLINGUAL | Status: DC | PRN

## 2016-02-26 MED ORDER — METHOCARBAMOL 1000 MG/10ML IJ SOLN
500.0000 mg | Freq: Four times a day (QID) | INTRAMUSCULAR | Status: DC | PRN
  Administered 2016-03-01: 500 mg via INTRAVENOUS
  Filled 2016-02-26 (×3): qty 5

## 2016-02-26 MED ORDER — PHENOL 1.4 % MT LIQD: 2.0000 | OROMUCOSAL | Status: DC | PRN

## 2016-02-26 MED ORDER — SODIUM CHLORIDE 0.9% FLUSH
10.0000 mL | INTRAVENOUS | Status: DC | PRN
  Administered 2016-02-27 – 2016-02-28 (×7): 10 mL
  Administered 2016-02-29: 30 mL
  Administered 2016-03-01 (×3): 20 mL
  Administered 2016-03-02: 10 mL
  Administered 2016-03-04 – 2016-03-05 (×2): 20 mL
  Administered 2016-03-05 – 2016-03-06 (×2): 30 mL
  Administered 2016-03-07: 10 mL
  Administered 2016-03-09: 30 mL
  Administered 2016-03-09 – 2016-03-10 (×2): 10 mL
  Administered 2016-03-13: 40 mL
  Administered 2016-03-13: 10 mL
  Administered 2016-03-14 – 2016-03-21 (×4): 30 mL
  Administered 2016-03-22: 20 mL
  Administered 2016-03-23 – 2016-03-29 (×3): 30 mL
  Filled 2016-02-26 (×30): qty 40

## 2016-02-26 MED ORDER — METOPROLOL TARTRATE 5 MG/5ML IV SOLN: 5.0000 mg | Freq: Four times a day (QID) | INTRAVENOUS | Status: DC | PRN

## 2016-02-26 MED ORDER — SODIUM CHLORIDE 0.9% FLUSH: 10.0000 mL | INTRAVENOUS | Status: DC | PRN

## 2016-02-26 MED ORDER — DEXTROSE 5 % IV SOLN
2.0000 g | INTRAVENOUS | Status: DC
  Administered 2016-02-27 – 2016-02-28 (×2): 2 g via INTRAVENOUS
  Filled 2016-02-26 (×3): qty 2

## 2016-02-26 MED ORDER — HALOPERIDOL LACTATE 5 MG/ML IJ SOLN
2.0000 mg | Freq: Four times a day (QID) | INTRAMUSCULAR | Status: DC | PRN
Start: 2016-02-26 — End: 2016-02-26

## 2016-02-26 MED ORDER — FLEET ENEMA 7-19 GM/118ML RE ENEM: 1.0000 | ENEMA | Freq: Once | RECTAL | Status: DC | PRN

## 2016-02-26 MED ORDER — ORAL CARE MOUTH RINSE: 15.0000 mL | Freq: Two times a day (BID) | OROMUCOSAL | 0 refills | Status: DC

## 2016-02-26 MED ORDER — MORPHINE SULFATE (PF) 4 MG/ML IV SOLN
1.0000 mg | INTRAVENOUS | Status: DC | PRN
  Administered 2016-03-01 – 2016-03-02 (×5): 1 mg via INTRAVENOUS
  Filled 2016-02-26 (×5): qty 1

## 2016-02-26 MED ORDER — INSULIN ASPART 100 UNIT/ML ~~LOC~~ SOLN
0.0000 [IU] | SUBCUTANEOUS | Status: DC
  Administered 2016-02-27: 1 [IU] via SUBCUTANEOUS

## 2016-02-26 MED ORDER — DIPHENHYDRAMINE HCL 50 MG/ML IJ SOLN
12.5000 mg | Freq: Four times a day (QID) | INTRAMUSCULAR | Status: DC | PRN
  Administered 2016-03-02: 25 mg via INTRAVENOUS
  Filled 2016-02-26: qty 1

## 2016-02-26 MED ORDER — TRACE MINERALS CR-CU-MN-SE-ZN 10-1000-500-60 MCG/ML IV SOLN
INTRAVENOUS | Status: AC
  Filled 2016-02-26: qty 1990

## 2016-02-26 MED ORDER — CEFTRIAXONE SODIUM 2 G IJ SOLR: 2.0000 g | INTRAMUSCULAR | Status: DC

## 2016-02-26 MED ORDER — NITROGLYCERIN 0.4 MG SL SUBL: 0.4000 mg | SUBLINGUAL_TABLET | SUBLINGUAL | 12 refills | Status: DC | PRN

## 2016-02-26 MED ORDER — MORPHINE SULFATE 2 MG/ML IV SOLN: 2.0000 mg | INTRAVENOUS | Status: DC

## 2016-02-26 MED ORDER — SODIUM CHLORIDE 0.9% FLUSH: 9.0000 mL | INTRAVENOUS | Status: DC | PRN

## 2016-02-26 MED ORDER — TRACE MINERALS CR-CU-MN-SE-ZN 10-1000-500-60 MCG/ML IV SOLN
INTRAVENOUS | Status: DC
  Administered 2016-02-26: 17:00:00 via INTRAVENOUS
  Filled 2016-02-26 (×2): qty 1990

## 2016-02-26 MED ORDER — TRACE MINERALS CR-CU-MN-SE-ZN 10-1000-500-60 MCG/ML IV SOLN
INTRAVENOUS | Status: DC
  Filled 2016-02-26 (×2): qty 1990

## 2016-02-26 MED ORDER — ORAL CARE MOUTH RINSE
15.0000 mL | Freq: Two times a day (BID) | OROMUCOSAL | Status: DC
  Administered 2016-02-27 – 2016-03-27 (×44): 15 mL via OROMUCOSAL

## 2016-02-26 MED ORDER — PROCHLORPERAZINE EDISYLATE 5 MG/ML IJ SOLN
5.0000 mg | INTRAMUSCULAR | Status: DC | PRN
  Administered 2016-03-06: 10 mg via INTRAVENOUS
  Administered 2016-03-07 – 2016-03-13 (×2): 5 mg via INTRAVENOUS
  Administered 2016-03-16: 10 mg via INTRAVENOUS
  Filled 2016-02-26 (×4): qty 2

## 2016-02-26 MED ORDER — NALOXONE HCL 0.4 MG/ML IJ SOLN: 0.4000 mg | INTRAMUSCULAR | Status: DC | PRN

## 2016-02-26 MED ORDER — POLYVINYL ALCOHOL 1.4 % OP SOLN: 1.0000 [drp] | Freq: Three times a day (TID) | OPHTHALMIC | 0 refills | Status: DC | PRN

## 2016-02-26 MED ORDER — ALBUTEROL SULFATE (2.5 MG/3ML) 0.083% IN NEBU: 2.5000 mg | INHALATION_SOLUTION | Freq: Four times a day (QID) | RESPIRATORY_TRACT | 12 refills | Status: DC | PRN

## 2016-02-26 MED ORDER — MAGIC MOUTHWASH: 15.0000 mL | Freq: Four times a day (QID) | ORAL | Status: DC | PRN

## 2016-02-26 MED ORDER — PROCHLORPERAZINE EDISYLATE 5 MG/ML IJ SOLN: 5.0000 mg | INTRAMUSCULAR | Status: DC | PRN

## 2016-02-26 MED ORDER — NICOTINE 7 MG/24HR TD PT24
7.0000 mg | MEDICATED_PATCH | Freq: Every day | TRANSDERMAL | Status: DC
  Administered 2016-02-27 – 2016-03-28 (×31): 7 mg via TRANSDERMAL
  Filled 2016-02-26 (×33): qty 1

## 2016-02-26 MED ORDER — ALBUTEROL SULFATE (2.5 MG/3ML) 0.083% IN NEBU: 2.5000 mg | INHALATION_SOLUTION | Freq: Four times a day (QID) | RESPIRATORY_TRACT | Status: DC | PRN

## 2016-02-26 MED ORDER — HYDRALAZINE HCL 20 MG/ML IJ SOLN: 5.0000 mg | Freq: Four times a day (QID) | INTRAMUSCULAR | Status: DC | PRN

## 2016-02-26 MED ORDER — FAT EMULSION 20 % IV EMUL
250.0000 mL | INTRAVENOUS | Status: DC
  Filled 2016-02-26: qty 250

## 2016-02-26 MED ORDER — POLYVINYL ALCOHOL 1.4 % OP SOLN
1.0000 [drp] | Freq: Three times a day (TID) | OPHTHALMIC | Status: DC | PRN
  Administered 2016-03-13: 1 [drp] via OPHTHALMIC
  Filled 2016-02-26: qty 15

## 2016-02-26 MED ORDER — MENTHOL 3 MG MT LOZG: 1.0000 | LOZENGE | OROMUCOSAL | 12 refills | Status: DC | PRN

## 2016-02-26 MED ORDER — ONDANSETRON HCL 4 MG/2ML IJ SOLN: 4.0000 mg | Freq: Four times a day (QID) | INTRAMUSCULAR | 0 refills | Status: DC | PRN

## 2016-02-26 MED ORDER — ENOXAPARIN SODIUM 40 MG/0.4ML ~~LOC~~ SOLN
40.0000 mg | SUBCUTANEOUS | Status: DC
  Administered 2016-02-26 – 2016-03-29 (×33): 40 mg via SUBCUTANEOUS
  Filled 2016-02-26 (×34): qty 0.4

## 2016-02-26 MED ORDER — HALOPERIDOL 0.5 MG PO TABS: 0.5000 mg | ORAL_TABLET | ORAL | Status: DC | PRN

## 2016-02-26 MED ORDER — HALOPERIDOL LACTATE 5 MG/ML IJ SOLN: 2.5000 mg | Freq: Four times a day (QID) | INTRAMUSCULAR | Status: DC | PRN

## 2016-02-26 MED ORDER — LIP MEDEX EX OINT
1.0000 "application " | TOPICAL_OINTMENT | Freq: Two times a day (BID) | CUTANEOUS | Status: DC
  Administered 2016-02-27 – 2016-03-28 (×46): 1 via TOPICAL
  Filled 2016-02-26 (×4): qty 7

## 2016-02-26 MED ORDER — SODIUM CHLORIDE 0.9% FLUSH
10.0000 mL | Freq: Two times a day (BID) | INTRAVENOUS | Status: DC
  Administered 2016-02-29 – 2016-03-02 (×3): 10 mL
  Administered 2016-03-03: 30 mL
  Administered 2016-03-03 – 2016-03-11 (×7): 10 mL
  Administered 2016-03-12: 30 mL
  Administered 2016-03-14 – 2016-03-30 (×9): 10 mL

## 2016-02-26 MED ORDER — MENTHOL 3 MG MT LOZG
1.0000 | LOZENGE | OROMUCOSAL | Status: DC | PRN
  Filled 2016-02-26: qty 9

## 2016-02-26 MED ORDER — MAGIC MOUTHWASH: 15.0000 mL | Freq: Four times a day (QID) | ORAL | 0 refills | Status: DC | PRN

## 2016-02-26 MED ORDER — ZINC OXIDE 40 % EX OINT
TOPICAL_OINTMENT | Freq: Two times a day (BID) | CUTANEOUS | Status: DC
  Administered 2016-02-27 – 2016-03-03 (×12): via TOPICAL
  Administered 2016-03-04: 1 via TOPICAL
  Administered 2016-03-04 – 2016-03-07 (×6): via TOPICAL
  Administered 2016-03-07 – 2016-03-08 (×2): 1 via TOPICAL
  Administered 2016-03-08 – 2016-03-09 (×3): via TOPICAL
  Administered 2016-03-10: 1 via TOPICAL
  Administered 2016-03-10 – 2016-03-16 (×12): via TOPICAL
  Filled 2016-02-26 (×3): qty 114

## 2016-02-26 MED ORDER — NICOTINE 7 MG/24HR TD PT24: 7.0000 mg | MEDICATED_PATCH | Freq: Every day | TRANSDERMAL | 0 refills | Status: DC

## 2016-02-26 MED ORDER — CYCLOSPORINE 0.05 % OP EMUL: 1.0000 [drp] | Freq: Two times a day (BID) | OPHTHALMIC | Status: DC

## 2016-02-26 MED ORDER — CHLORHEXIDINE GLUCONATE 0.12 % MT SOLN: 15.0000 mL | Freq: Two times a day (BID) | OROMUCOSAL | 0 refills | Status: DC

## 2016-02-26 MED ORDER — FAT EMULSION 20 % IV EMUL: 250.0000 mL | INTRAVENOUS | Status: DC

## 2016-02-26 MED ORDER — METHOCARBAMOL 1000 MG/10ML IJ SOLN: 500.0000 mg | Freq: Four times a day (QID) | INTRAVENOUS | Status: DC | PRN

## 2016-02-26 MED ORDER — TRAZODONE HCL 50 MG PO TABS
50.0000 mg | ORAL_TABLET | Freq: Every evening | ORAL | Status: DC | PRN
  Administered 2016-02-26: 50 mg
  Filled 2016-02-26: qty 1

## 2016-02-26 MED ORDER — INSULIN ASPART 100 UNIT/ML ~~LOC~~ SOLN: 0.0000 [IU] | Freq: Four times a day (QID) | SUBCUTANEOUS | 11 refills | Status: DC

## 2016-02-26 MED ORDER — LIP MEDEX EX OINT: 1.0000 "application " | TOPICAL_OINTMENT | Freq: Two times a day (BID) | CUTANEOUS | 0 refills | Status: DC

## 2016-02-26 MED ORDER — PHENOL 1.4 % MT LIQD: 2.0000 | OROMUCOSAL | 0 refills | Status: DC | PRN

## 2016-02-26 MED ORDER — PANTOPRAZOLE SODIUM 40 MG IV SOLR: 40.0000 mg | Freq: Every day | INTRAVENOUS | Status: DC

## 2016-02-26 MED ORDER — METOPROLOL TARTRATE 5 MG/5ML IV SOLN
5.0000 mg | Freq: Four times a day (QID) | INTRAVENOUS | Status: DC | PRN
  Filled 2016-02-26: qty 5

## 2016-02-26 MED ORDER — ENOXAPARIN SODIUM 40 MG/0.4ML ~~LOC~~ SOLN: 40.0000 mg | SUBCUTANEOUS | Status: DC

## 2016-02-26 NOTE — Progress Notes (Signed)
Patient PCA pump DC per MD order because [facility] unable to transport pt with PCA pump.

## 2016-02-26 NOTE — Care Management Note (Signed)
Case Management Note  Patient Details  Name: [name] MRN: [id number] Date of Birth: August 02, 1940  Subjective/Objective:  Received call from CIR rep-Genie able to accept for  CIR @ [facility].Grateful! No further CM needs.                 Action/Plan:d/c CIR   Expected Discharge Date:                  Expected Discharge Plan:  IP Rehab Facility  In-House Referral:  Clinical Social Work  Discharge planning Services  CM Consult  Post Acute Care Choice:  Home Health (Active w/[facility] infusion-TPN) Choice offered to:  Patient, Adult Children  DME Arranged:    DME Agency:     HH Arranged:    HH Agency:     Status of Service:  Completed, signed off  If discussed at [employer] Meetings, dates discussed:    Additional Comments:  [name], Jacque Garrels, RN 02/26/2016, 12:34 PM

## 2016-02-26 NOTE — Progress Notes (Signed)
Referring Physician(s): Martin,M  Supervising Physician: Irish Lack  Patient Status:  [facility] - In-pt  Chief Complaint:  LLQ abscess  Subjective:  Pt still with some confusion; otherwise denies sig abd pain,N/V  Allergies: Flomax [tamsulosin hcl]; Lisinopril; Lorazepam; and Uroxatral [alfuzosin hcl er]  Medications: Prior to Admission medications   Medication Sig Start Date End Date Taking? Authorizing Provider  aluminum-magnesium hydroxide-simethicone (MAALOX) 200-200-20 MG/5ML SUSP Take 30 mLs by mouth every 6 (six) hours as needed.   Yes Historical Provider, MD  Amino Ac Elect-Calc in D10W (CLINIMIX E/DEXTROSE, 2.75/10,) 2.75 % SOLN Inject 2,160 mLs into the vein as needed.   Yes Historical Provider, MD  aspirin 81 MG chewable tablet Place 81 mg into feeding tube every evening.   Yes Historical Provider, MD  atorvastatin (LIPITOR) 40 MG tablet Place 40 mg into feeding tube at bedtime.    Yes Historical Provider, MD  bisacodyl (DULCOLAX) 10 MG suppository Place 10 mg rectally daily as needed for moderate constipation or severe constipation.   Yes Historical Provider, MD  bismuth subsalicylate (PEPTO BISMOL) 262 MG/15ML suspension Place 30 mLs into feeding tube every 8 (eight) hours as needed for indigestion or diarrhea or loose stools.   Yes Historical Provider, MD  calcium-vitamin D (OSCAL WITH D) 500-200 MG-UNIT tablet Place 1 tablet into feeding tube daily with breakfast.    Yes Historical Provider, MD  Cyanocobalamin (VITAMIN B-12) 1000 MCG SUBL Place 1 tablet under the tongue daily with breakfast.   Yes Historical Provider, MD  Ferrous Sulfate 220 (44 Fe) MG/5ML LIQD Place 7.5 mLs into feeding tube at bedtime.    Yes Historical Provider, MD  FLUoxetine (PROZAC) 20 MG/5ML solution Place 20 mg into feeding tube daily.   Yes Historical Provider, MD  haloperidol (HALDOL) 0.5 MG tablet Take 0.25-0.5 mg by mouth 3 (three) times daily. At 6 am and 4 PM and 9 PM   Yes Historical  Provider, MD  haloperidol (HALDOL) 0.5 MG tablet Place 0.5 mg into feeding tube as needed for agitation. STOP DATE 01/24/2016 01/10/16 01/24/16 Yes Historical Provider, MD  loperamide (IMODIUM) 1 MG/5ML solution 10 mg. Take 10 mls via tube every 8 hours as needed for more than 2 bms every 8 hours.   Yes Historical Provider, MD  magic mouthwash SOLN Take 15 mLs by mouth 4 (four) times daily as needed for mouth pain (sore throat). 12/30/15  Yes Karie Soda, MD  metoCLOPramide (REGLAN) 5 MG/5ML solution Place 10 mg into feeding tube every 6 (six) hours as needed for nausea or vomiting.   Yes Historical Provider, MD  metoprolol tartrate (LOPRESSOR) 25 MG tablet Place 25 mg into feeding tube daily.   Yes Historical Provider, MD  nicotine (NICODERM CQ - DOSED IN MG/24 HR) 7 mg/24hr patch Place 7 mg onto the skin daily. Rotate site   Yes Historical Provider, MD  nitroGLYCERIN (NITROSTAT) 0.4 MG SL tablet Place 0.4 mg under the tongue every 5 (five) minutes as needed for chest pain.   Yes Historical Provider, MD  Nutritional Supplements (FEEDING SUPPLEMENT, OSMOLITE 1.2 CAL,) LIQD Place 237 mLs into feeding tube. Give 1 can via G-tube every day at  10 am and  6 pm.   Yes Historical Provider, MD  omeprazole (PRILOSEC) 40 MG capsule Take 40 mg by mouth 2 (two) times daily.   Yes Historical Provider, MD  Polyvinyl Alcohol-Povidone (REFRESH OP) Place 2 drops into both eyes as needed (For dry eyes.).    Yes Historical Provider, MD  potassium chloride (KCL) 2 mEq/mL SOLN oral liquid Take 30 mEq by mouth daily.   Yes Historical Provider, MD  RESTASIS 0.05 % ophthalmic emulsion Place 1 drop into both eyes 2 (two) times daily.  04/16/13  Yes Historical Provider, MD  traZODone (DESYREL) 50 MG tablet Place 100 mg into feeding tube at bedtime.    Yes Historical Provider, MD  Water For Irrigation, Sterile (STERILE WATER FOR IRRIGATION) Irrigate with 250 mLs as directed every 6 (six) hours.    Historical Provider, MD      Vital Signs: BP 116/65 (BP Location: Left Arm)   Pulse 90   Temp 98.4 F (36.9 C) (Oral)   Resp 17   Ht 6\' 2"  (1.88 m)   Wt 173 lb 4.5 oz (78.6 kg)   SpO2 98%   BMI 22.25 kg/m   Physical Exam LLQ drain intact, insertion site ok, NT, output about 10 cc, drain flushed without difficulty  Imaging: Ir Sinus/fist Tube Chk-non Gi  Result Date: 02/25/2016 INDICATION: Status post percutaneous drainage of peritoneal abscess on 02/14/2016 following prior gastric surgery, laparoscopic gastrostomy and small bowel resection. EXAM: SINUS TRACT INJECTION / FISTULOGRAM CONTRAST:  10 mL Isovue-300 FLUOROSCOPY TIME:  1 minute.  22.9 mGy. MEDICATIONS: None. ANESTHESIA/SEDATION: None. COMPLICATIONS: None. TECHNIQUE: The pre-existing left lower quadrant peritoneal percutaneous drainage catheter was injected with contrast. Multiple fluoroscopic spot images were obtained. The drainage catheter was then flushed and reconnected to a suction bulb. PROCEDURE: See above. FINDINGS: There is initial filling of an irregular abscess cavity. With further distension of the cavity, there is opacification of adjacent small bowel near the level of a pre-existing gastrojejunal feeding catheter. Transit of contrast is seen more distally into small bowel within the right lower quadrant. IMPRESSION: The percutaneous drainage catheter injection is positive for fistula to adjacent small bowel. Electronically Signed   By: Irish Lack M.D.   On: 02/25/2016 16:34    Labs:  CBC:  Recent Labs  02/23/16 0554 02/24/16 0519 02/25/16 0351 02/26/16 0405  WBC 12.3* 11.4* 10.7* 10.1  HGB 8.0* 8.1* 7.9* 7.7*  HCT 25.0* 25.1* 24.8* 24.2*  PLT 681* 676* 585* 622*    COAGS:  Recent Labs  12/04/15 2020 12/11/15 0405 12/13/15 1402 02/13/16 1140  INR 1.62 1.25 1.26 1.18  APTT 39*  --  38*  --     BMP:  Recent Labs  02/20/16 0330 02/22/16 0451 02/24/16 0519 02/26/16 0405  NA 135 135 135 136  K 3.8 4.1 4.1 3.9   CL 104 105 105 105  CO2 26 23 25 25   GLUCOSE 111* 107* 124* 118*  BUN 27* 31* 37* 33*  CALCIUM 8.6* 8.6* 8.7* 8.7*  CREATININE 0.67 0.78 0.69 0.61  GFRNONAA >60 >60 >60 >60  GFRAA >60 >60 >60 >60    LIVER FUNCTION TESTS:  Recent Labs  02/15/16 0346 02/18/16 0835 02/22/16 0451 02/26/16 0405  BILITOT 0.3 0.2* <0.1* 0.2*  AST 25 21 27 30   ALT 27 20 20 22   ALKPHOS 287* 283* 249* 204*  PROT 5.6* 6.2* 6.2* 6.7  ALBUMIN 1.3* 1.4* 1.4* 1.6*    Assessment and Plan: S/p ex lap with SBR and now with EC fistula and LLQ abscess, s/p perc drain on 12/31; WBC nl; hgb 7.7(7.9), creat nl; alb 1.6; LLQ drain injection yesterday revealed fistula to SB as expected; JP bulb changed out for gravity bag; cont current tx; additional plans as per CCS   Electronically Signed: D. Jeananne Rama 02/26/2016, 10:34 AM  I spent a total of 15 minutes at the the patient's bedside AND on the patient's hospital floor or unit, greater than 50% of which was counseling/coordinating care for pelvic abscess drain    Patient ID: [name], male   DOB: 02/12/1941, 76 y.o.   MRN: [id number]

## 2016-02-26 NOTE — Progress Notes (Signed)
Rehab admissions - I have received approval for acute inpatient rehab admission.  I will come see patient this am and will contact the family.  I do have rehab beds available today.  Call me for questions.  #[phone]

## 2016-02-26 NOTE — Progress Notes (Signed)
[facility] Progress Note  20 Days Post-Op  Subjective: Pt states his abdomin feels the same. No new complaints. Sitting up in bed about to work with PT.   Objective: Vital signs in last 24 hours: Temp:  [97.6 F (36.4 C)-98.4 F (36.9 C)] 98.4 F (36.9 C) (01/12 0538) Pulse Rate:  [66-99] 90 (01/12 0538) Resp:  [16-18] 17 (01/12 0538) BP: (116-139)/(47-65) 116/65 (01/12 0538) SpO2:  [96 %-100 %] 98 % (01/12 0538) FiO2 (%):  [32 %-34 %] 34 % (01/12 0359) Weight:  [173 lb 4.5 oz (78.6 kg)] 173 lb 4.5 oz (78.6 kg) (01/12 0333) Last BM Date: 02/08/16  Intake/Output from previous day: 01/11 0701 - 01/12 0700 In: 118 [I.V.:68; IV Piggyback:50] Out: 1350 [Urine:700; Drains:650] Intake/Output this shift: Total I/O In: -  Out: 400 [Urine:300; Drains:100]  PE: Gen: Alert, NAD, pleasant, cooperative, sitting on edge of bed Card: RRR, no M/G/R heard Pulm: effort normal, CTA bilaterally, no wheezes, rales, or rhonchi noted.  Abd: Soft, non distended, +BS, Eakin's pouch in place with feculent looking material in pouch, IR drain in place, new bag no output at this time, and Gtube with bilious drainage. Black eschar noted around G tube with tenderness no blood or erythema noted. Generalized abdominal TTP. Skin: no rashes noted, warm and dry, pale  Lab Results:   Recent Labs  02/25/16 0351 02/26/16 0405  WBC 10.7* 10.1  HGB 7.9* 7.7*  HCT 24.8* 24.2*  PLT 585* 622*   BMET  Recent Labs  02/24/16 0519 02/26/16 0405  NA 135 136  K 4.1 3.9  CL 105 105  CO2 25 25  GLUCOSE 124* 118*  BUN 37* 33*  CREATININE 0.69 0.61  CALCIUM 8.7* 8.7*   PT/INR No results for input(s): LABPROT, INR in the last 72 hours. CMP     Component Value Date/Time   NA 136 02/26/2016 0405   NA 139 01/14/2016   K 3.9 02/26/2016 0405   CL 105 02/26/2016 0405   CO2 25 02/26/2016 0405   GLUCOSE 118 (H) 02/26/2016 0405   BUN 33 (H) 02/26/2016 0405   BUN 23 (A) 01/14/2016   CREATININE 0.61 02/26/2016 0405   CALCIUM 8.7 (L) 02/26/2016 0405   PROT 6.7 02/26/2016 0405   ALBUMIN 1.6 (L) 02/26/2016 0405   AST 30 02/26/2016 0405   ALT 22 02/26/2016 0405   ALKPHOS 204 (H) 02/26/2016 0405   BILITOT 0.2 (L) 02/26/2016 0405   GFRNONAA >60 02/26/2016 0405   GFRAA >60 02/26/2016 0405   Lipase     Component Value Date/Time   LIPASE 55 (H) 01/20/2016 0158       Studies/Results: Ir Sinus/fist Tube Chk-non Gi  Result Date: 02/25/2016 INDICATION: Status post percutaneous drainage of peritoneal abscess on 02/14/2016 following prior gastric surgery, laparoscopic gastrostomy and small bowel resection. EXAM: SINUS TRACT INJECTION / FISTULOGRAM CONTRAST:  10 mL Isovue-300 FLUOROSCOPY TIME:  1 minute.  22.9 mGy. MEDICATIONS: None. ANESTHESIA/SEDATION: None. COMPLICATIONS: None. TECHNIQUE: The pre-existing left lower quadrant peritoneal percutaneous drainage catheter was injected with contrast. Multiple fluoroscopic spot images were obtained. The drainage catheter was then flushed and reconnected to a suction bulb. PROCEDURE: See above. FINDINGS: There is initial filling of an irregular abscess cavity. With further distension of the cavity, there is opacification of adjacent small bowel near the level of a pre-existing gastrojejunal feeding catheter. Transit of contrast is seen more distally into small bowel within the right lower quadrant. IMPRESSION: The percutaneous drainage catheter injection is positive  for fistula to adjacent small bowel. Electronically Signed   By: Irish LackGlenn  Yamagata M.D.   On: 02/25/2016 16:34    Anti-infectives: Anti-infectives    Start     Dose/Rate Route Frequency Ordered Stop   02/17/16 2000  cefTRIAXone (ROCEPHIN) 2 g in dextrose 5 % 50 mL IVPB     2 g 100 mL/hr over 30 Minutes Intravenous Every 24 hours 02/17/16 1434     02/12/16 2200  vancomycin (VANCOCIN) 1,500 mg in sodium chloride 0.9 % 500 mL IVPB  Status:  Discontinued     1,500 mg 250 mL/hr  over 120 Minutes Intravenous Every 24 hours 02/12/16 1618 02/16/16 1340   02/09/16 0200  vancomycin (VANCOCIN) IVPB 1000 mg/200 mL premix  Status:  Discontinued     1,000 mg 200 mL/hr over 60 Minutes Intravenous Every 12 hours 02/08/16 1300 02/12/16 1359   02/08/16 1400  vancomycin (VANCOCIN) IVPB 1000 mg/200 mL premix  Status:  Discontinued     1,000 mg 200 mL/hr over 60 Minutes Intravenous Every 12 hours 02/08/16 1259 02/08/16 1300   02/08/16 1300  vancomycin (VANCOCIN) IVPB 1000 mg/200 mL premix     1,000 mg 200 mL/hr over 60 Minutes Intravenous  Once 02/08/16 1217 02/08/16 1336   02/08/16 1200  piperacillin-tazobactam (ZOSYN) IVPB 3.375 g  Status:  Discontinued     3.375 g 12.5 mL/hr over 240 Minutes Intravenous Every 8 hours 02/08/16 1136 02/17/16 1433   02/06/16 1415  piperacillin-tazobactam (ZOSYN) IVPB 3.375 g     3.375 g 100 mL/hr over 30 Minutes Intravenous  Once 02/06/16 1413 02/06/16 1409   01/26/16 2300  piperacillin-tazobactam (ZOSYN) IVPB 3.375 g  Status:  Discontinued     3.375 g 12.5 mL/hr over 240 Minutes Intravenous Every 8 hours 01/26/16 2256 02/04/16 0905   01/26/16 2230  vancomycin (VANCOCIN) IVPB 1000 mg/200 mL premix  Status:  Discontinued     1,000 mg 200 mL/hr over 60 Minutes Intravenous Every 12 hours 01/26/16 2227 01/26/16 2227   01/20/16 0400  vancomycin (VANCOCIN) IVPB 1000 mg/200 mL premix  Status:  Discontinued     1,000 mg 200 mL/hr over 60 Minutes Intravenous Every 12 hours 01/20/16 0323 01/21/16 0900   01/20/16 0330  ceFEPIme (MAXIPIME) 1 g in dextrose 5 % 50 mL IVPB     1 g 100 mL/hr over 30 Minutes Intravenous  Once 01/20/16 0317 01/20/16 0735       Assessment/Plan  PSBO with 2 days of nausea and vomiting on readmit 01/20/16 prior procedures above S/p EXPLORATORY LAPAROTOMY, LYSIS OF ADHESIONS, SEGMENTAL SMALL BOWEL RESECTION, GASTROJEJUNOSTOMY , 02/06/16, Dr. Chevis PrettyPaul Toth G tube dislodged 02/10/16, bed tansfer New EC fistula midline 02/13/16/CT  shows LLQ intraabdominal abscess CT guided LLQabdominal abscess drainage, 02/14/16; IR Dr. Fredia SorrowYamagata Post op Ventricular tachycardia Tracheostomy - removed 02/24/16 Aspiration pneumonia/pleural effusion Malnutrition/deconditioning Anemia  Stage II sacral decubitus Malnutrition formerly on TF/now on TNA  FEN: NPO/TNA  ID: Zosyn 02/06/16 thru 02/17/16; Vancomycin 02/08/16 thru 02/15/16; Rocephin started 02/17/16 =>>day 9 DVT: Heparin/SCD   Appreciated IR's recommendations: LLQ drain injection yesterday revealed fistula to SB as expected; JP bulb changed out for gravity bag; cont current tx;   Plan: Continue TPN, monitor fistula output, drain study yesterday will keep drain. G tube output decreased from daybefore. Continue supportive care.  WBC trending down.Hg stable. Trach removed. [facility] and possible debridement bedside. No additional surgeries at this time. Give time for the enterocutaneous fistula to close once his  severe malnutrition is improved.  Dispo: Pt will be discharged today to [facility] rehab. Family is agreeable with this.   LOS: 37 days    Jerre Simon , [facility] 02/26/2016, 11:57 AM Pager: [phone] Consults: [phone] Mon-Fri 7:00 am-4:30 pm Sat-Sun 7:00 am-11:30 am

## 2016-02-26 NOTE — Progress Notes (Signed)
Rehab admissions - I have sent all information to insurance case manager for review requesting acute inpatient rehab admission.  I will update all once I hear back from insurance case manager.  Call me for questions.  #[phone]

## 2016-02-26 NOTE — Progress Notes (Signed)
Physical Therapy Treatment Patient Details Name: Ardyth GalRalph T Payes MRN: [id number] DOB: [birth year] Today's Date: 02/26/2016    [facility]Dx N/V r/o ileus. S/P 02/06/16 forEXPLORATORY LAPAROTOMY, LYSIS OF ADHESIONS, SEGMENTAL SMALL BOWEL RESECTION, GASTROJEJUNOSTOMY 12/23    PT Comments    Assisted OOB to amb.  Pt progressing well.  Demonstrates increased ability to rise   Initial posterior lean/LOB.    Follow Up Recommendations  CIR     Equipment Recommendations       Recommendations for Other Services       Precautions / Restrictions Precautions Precautions: Fall Precaution Comments: recent LAP with mod ABD drainage, 1 drains, 1 bulb    , PCA, trach capped Restrictions Weight Bearing Restrictions: No    Mobility  Bed Mobility Overal bed mobility: Needs Assistance Bed Mobility: Supine to Sit     Supine to sit: Min assist;Mod assist     General bed mobility comments: increased time  Transfers Overall transfer level: Needs assistance Equipment used: Rolling walker (2 wheeled) Transfers: Sit to/from Stand Sit to Stand: Min assist;Mod assist         General transfer comment: 25% VC's for direction and pt able to self rise with proper hand placement and with a count of 1, 2, 3  Ambulation/Gait Ambulation/Gait assistance: Min assist;+2 safety/equipment Ambulation Distance (Feet): 185 Feet (60, 60, 62 feet with sitting rest break between ) Assistive device: Rolling walker (2 wheeled) Gait Pattern/deviations: Step-through pattern;Decreased stride length;Drifts right/left Gait velocity: decreased   General Gait Details: progressing well with mobility.  Demonstartes initial posterior lean/LOB   Information systems managertairs            Wheelchair Mobility    Modified Rankin (Stroke Patients Only)       Balance                                    Cognition Arousal/Alertness: Awake/alert Behavior During Therapy:  WFL for tasks assessed/performed Overall Cognitive Status: Within Functional Limits for tasks assessed                      Exercises      General Comments        Pertinent Vitals/Pain Pain Assessment: Faces Pain Location: c/o headache Pain Descriptors / Indicators: Discomfort    Home Living                      Prior Function            PT Goals (current goals can now be found in the care plan section) Progress towards PT goals: Progressing toward goals    Frequency    Min 3X/week      PT Plan Current plan remains appropriate    Co-evaluation             End of Session Equipment Utilized During Treatment: Gait belt Activity Tolerance: Patient tolerated treatment well Patient left: in chair;with chair alarm set;with family/visitor present     Time: [id number] PT Time Calculation (min) (ACUTE ONLY): 43 min  Charges:  $Gait Training: 23-37 mins $Therapeutic Activity: 8-22 mins                    G Codes:      Felecia ShellingLori Meshach Perry  PTA [facility]  Acute  Rehab Pager      [phone]

## 2016-02-26 NOTE — Progress Notes (Signed)
[facility]. Pt was alert with stable vitals on room air. Picc line intact with TPN and lipids infusing through line. Day shift RN called [facility] to give RN receiving report. Pt GI tubes intact. Family at bedside. [facility]

## 2016-02-26 NOTE — Progress Notes (Signed)
PHARMACY - ADULT TOTAL PARENTERAL NUTRITION CONSULT NOTE   Pharmacy Consult for TPN Indication: PTA TPN continuation; bowel perforation, then new EC fistula 12/30  Insulin Requirements:  SSI:  1 units SSI needed in past 24 hours TPN: 10 units regular insulin per day in TPN   Current Nutrition:  - NPO - cyclic TPN  IVF:  none  Central access: PICC TPN start date: PTA, restarted on admission on 12/6    Recent Labs  02/24/16 0519 02/25/16 0351 02/26/16 0405  NA 135  --  136  K 4.1  --  3.9  CL 105  --  105  CO2 25  --  25  GLUCOSE 124*  --  118*  BUN 37*  --  33*  CREATININE 0.69  --  0.61  CALCIUM 8.7*  --  8.7*  PHOS  --  4.1  --   MG  --  1.7  --   ALBUMIN  --   --  1.6*  ALKPHOS  --   --  204*  AST  --   --  30  ALT  --   --  22  BILITOT  --   --  0.2*   ASSESSMENT                                                                                                          HPI: 16 yoM with hx PUD, CAD, GERD, BPH,  s/p multiple procedures  and extended hospitalization for recurrent partial GOO 2/2 pyloric stricture on TPN for several days, discharged 11/15 with J/G tube in place on TFs, unable to tolerate and had to transition back to TPN via [facility] on 11/22, readmitted 12/6 with partial SBO. Pharmacy consulted to resume TPN. Note: PTA TNA from [place] was as follows: Totals per day: 405 grams (1376 kcal)  of dextrose, 75 grams (300 kcal) of protein and 50 grams ( 480 kcal) of lipid for a total of 2156 kcal/day   Significant events:  12/6: TPN continued on admission.  12/7: Pt pulled out NGT, not replaced, remains NPO 12/11: Awaiting MBS, if no aspiration, will start to advance diet.  Having bowel function.  Continue TPN for now.   12/12: Severe aspiration risk per MBS yesterday - recommend continuing NPO. Osmolite 1.2 CAL via TF BID ordered 12/11 at 6pm. Per discussion with CCS PA, continue TPN at current goal rate today, wean tomorrow if tolerates TF. 12/13: Did  not tolerate bolus feeds through G-tube; overnight emesis and aspiration event, started on Zosyn. CCS trying trickle feeds today. 12/14 transition to cyclic TPN - appears recovery may be prolonged. Not tolerating trickle feeds; G-tube to drainage.  12/15: TPN was not run at cyclic rate overnight d/t conflicting information in TPN order. To provide patient full needed nutrition, will continue to run TPN at 83 ml/hr over 24 hr and begin 18-hr cycle with tonight's TPN 12/16: start 12h TPN cycle 12/18: resume trickle tube feed at 5 ml/hr 12/19: Nausea/vomiting, tube feed held. TPN formula changed to E 5/20  from E 5/15 12/21: successful transition of G tube to [place] tube. Plan scan for patency before resuming tube feeds.  12/22: Upper GI scan in IR today to ensure J-tube working.  12/23: exp lap, lysis of adhesions, segmental small bowel resection, gastrojejunostomy 12/25: TPN converted from cyclic back to continuous due to hypoglycemic event following end of TPN cycle and patient likely to remain hospitalized for at least several more days.  Prealbumin low, falling - Fat Emulsion increased to 10 mL/hr.  Clinimix already at 2L/day. 12/28: to start trickle feeds thru J-tube; G-tube remains on drainage 12/29: TF increased from 10 ml/hr to 20 ml/hr  12/29 PM: TF was found draining from midline incision, CT abdomen c/w bowel perforation at G-J suture site; interval development of abscesses.  TF was stopped and is to remain off until MD orders otherwise. 12/31: percutaneous drainage of LLQ fluid collection. 1/3: EC Fistula - feculent material from drain placed 12/31 after perc drainage  & in midline wound.  1/4: Changed to cyclic TPN over 18 hours 1/8: cycle TPN over 16 hours  Today, 02/26/2016:   Glucose: CBGs controlled. CBG 2 hours following end of cyclic TPN 85-462 past couple of days.   Electrolytes:  WNL   Renal: SCr WNL, BUN slightly elevated  LFTs:  Alk phos elevated but decreasing.  All others <  ULN.  TGs: (12/24) 134, (1/1) 145, 146 (1/8)  Prealbumin:  15.3 ( 11/13), 10.8 (12/7), 12.8 (12/11), 13.1 (12/18), 8.9 (12/25), 10 (1/1), 14 (1/8)  Total drain output: 650 ml/24 hr   NUTRITIONAL GOALS                                                                                             RD recs (12/26): Kcal 2000-2200, Protein 100-115g, Fluid 2L/day [facility] TPN: 2156 Kcal/day, Protein 75g/day  Clinimix E 5/20 2000 ml/day + 20% fat emulsion 240 ml/day: 100 g/day protein, 2240 Kcal/day. (+++There is currently a Psychologist, prison and probation services of Clinimix solution.  Pharmacy will use Clinimix product based on what we have available in stock+++)  PLAN                                                                                  Note: pending potential move to acute inpatient rehab today. Will prepare TPN for this evening and transfer with patient if moved today.  At 1800 today:  Cycle TPN over 16 hours: Clinimix E 5/20 at 50 mls/hr x 1 hour then increase to 135 mls/hr x 14 hours then 50 mls/hr x 1 hour then stop.  Cycle 20% fat emulsion 234m over 16 hours.  Continue 10 units regular insulin per day in TPN.   Continue standard multivitamins and trace elements in TPN.  Continue sensitive SSI and CBG checks q6h.  TPN lab panels on Mondays &  Thursdays  Follow clinical course daily.   Hershal Coria, PharmD, BCPS Pager: [phone] 02/26/2016 11:38 AM

## 2016-02-26 NOTE — Clinical Social Work Note (Signed)
Currently patient being considering for IP REAHB, awaiting insurance authorization at this time.   MSW remains available as needed.   Derenda FennelBashira Tynesia Harral, MSW [phone] 02/26/2016 11:01 AM

## 2016-02-26 NOTE — H&P (Signed)
Physical Medicine and Rehabilitation Admission H&P     Chief Complaint  Patient presents with  . debility  HPI: [name] is a 76 year old male with history of CAD, chronic back pain, COPD, GERD with dysphonia, PUD due to ulcer and abdominal pain who underwent distal gastrectomy with vagotomy 09/2015 due to acquired stricture. Post op course complicated by gastric ileus requiring jejunostomy, encephalopathy and FTT. He developed gastric leak due to jejunal disruption after pulling out his tube and required wash out with omentopexy 10/19, VDRF --tracheostomy, TPN as well as tube feeds and discharged to SNF for care and monitoring. He was readmitted on 01/19/16 due to abdominal pain with severe vomiting and unable to tolerate oral intake. He was found to have partial SBO treated conservatively without improvement and taken to OR on 12/23 for exp lap with lysis of adhesions, segmental SB resection and gastrojejunostomy. He developed drainage of enteric contents from midline incision on 12/29 and was found to have LLQ abscess with EC fistula requiring percutaneous drain placed by IVR on 12/31. Drainage from EC fistula from midline incision managed with use of Eakin pouch.  Intraabdominal wound cultures positive for moderate Klebsiella pneumoniae, moderate E coli and moderate bacteriodes thetaiotaomicron betalactamase positive. Respiratory status stable without significant secretions and patient decannulated on 01/10. Dr. Michaell Cowing feels that fistula likely gastrojejunostomy and should heal with bowel rest --NPO and TNA to help with nutritional supplement. Surgery if fistula present at 6-12 months. LLQ drain injection 01/11 revealed fistula to SB--JP drain changed out to gravity bag. Antibiotics narrowed to Rocephin on 01/3 Mentation improving with confusion resolving and ongoing anxiety/agitation at nights. Therapy ongoing and patient showing improvement in activity tolerance and mobility. CIR recommended by rehab  team.  Review of Systems  Constitutional: Negative for fever.  HENT: Negative for ear discharge and ear pain.  Eyes: Negative for blurred vision and double vision.  Respiratory: Positive for shortness of breath. Negative for cough.  Cardiovascular: Positive for leg swelling.  Gastrointestinal: Positive for abdominal pain and nausea. Negative for heartburn.  Genitourinary: Negative for dysuria.  Musculoskeletal: Positive for myalgias.  Skin: Positive for rash.  Neurological: Positive for dizziness.  Endo/Heme/Allergies: Does not bruise/bleed easily.  Psychiatric/Behavioral: Negative for suicidal ideas.       Past Medical History:  Diagnosis Date  . Bilateral dry eyes   . Bile peritonitis due to gastric tube dislodgement 12/03/2015  . BPH (benign prostatic hyperplasia)   . Central pterygium of right eye   . Coronary artery disease 1997, 2009   bare mental stent right coronart artery, occlusive followed by Dr. Garnette Scheuermann in [place]  . Degenerative disc disease    LOWER BACK  . Dysphonia   . ED (erectile dysfunction)   . Gastric outlet obstruction 11/24/2015  . Gastroesophageal reflux disease   . Hyperlipidemia   . Hyperopia of both eyes with astigmatism and presbyopia   . Hypertension   . Nuclear senile cataract    Dr. [facility] in [place]  . Peptic ulcer   . Pneumatosis of intestines 12/11/2015  . Pneumonia    HISTORY OF IN CHILDHOOD  . Posterior vitreous detachment 10/30/2012  . Salzmann's nodular dystrophy of left eye   . Urinary hesitancy   . Wears glasses         Past Surgical History:  Procedure Laterality Date  . CARDIAC CATHETERIZATION    . CHOLECYSTECTOMY    . ESOPHAGOGASTRODUODENOSCOPY N/A 12/01/2015   Procedure: ESOPHAGOGASTRODUODENOSCOPY (EGD) BALLOON DILATION OF  DUODENAL STRICTURE; Surgeon: Karie Soda, MD; Location: [facility]; Service: General; Laterality: N/A;  . ESOPHAGOGASTRODUODENOSCOPY N/A 12/14/2015   Procedure:  ESOPHAGOGASTRODUODENOSCOPY (EGD); Surgeon: Vida Rigger, MD; Location: [facility]; Service: Endoscopy; Laterality: N/A; Patient has tracheostomy  . ESOPHAGOGASTRODUODENOSCOPY (EGD) WITH PROPOFOL N/A 07/01/2015   Procedure: ESOPHAGOGASTRODUODENOSCOPY (EGD) WITH PROPOFOL; Surgeon: Willis Modena, MD; Location: [facility]; Service: Endoscopy; Laterality: N/A;  . ESOPHAGOGASTRODUODENOSCOPY (EGD) WITH PROPOFOL N/A 11/23/2015   Procedure: ESOPHAGOGASTRODUODENOSCOPY (EGD) WITH PROPOFOL; Surgeon: Willis Modena, MD; Location: [facility]; Service: Endoscopy; Laterality: N/A; may need to intubate  . EYE SURGERY     growth on right eye removed   . IR GENERIC HISTORICAL  12/13/2015   IR REPLC DUODEN/JEJUNO TUBE PERCUT W/FLUORO 12/13/2015 [name], MD [facility]  . IR GENERIC HISTORICAL  02/03/2016   IR REPLC GASTRO/COLONIC TUBE PERCUT W/FLUORO 02/03/2016 Berdine Dance, MD [facility]  . IR GENERIC HISTORICAL  02/04/2016   IR GASTR TUBE CONVERT GASTR-JEJ PER W/FL MOD SED 02/04/2016 Malachy Moan, MD [facility]  . IR GENERIC HISTORICAL  02/25/2016   IR SINUS/FIST TUBE CHK-NON GI 02/25/2016 Irish Lack, MD [facility]  . LAPAROSCOPIC GASTROSTOMY N/A 12/01/2015   Procedure: LAPAROSCOPIC PLACEMENT OF FEEDING JEJUNOSTOMY AND GASTROSTOMY TUBE; Surgeon: Karie Soda, MD; Location: [facility]; Service: General; Laterality: N/A;  . LAPAROSCOPY N/A 12/03/2015   Procedure: LAPAROSCOPY DIAGNOSTIC, OMENTOPEXY, JEJUNOSTOMY, WASH OUT; Surgeon: Karie Soda, MD; Location: [facility]; Service: General; Laterality: N/A;  . LAPAROTOMY N/A 02/06/2016   Procedure: EXPLORATORY LAPAROTOMY, LYSIS OF ADHESIONS, SEGMENTAL SMALL BOWEL RESECTION, GASTROJEJUNOSTOMY; Surgeon: Chevis Pretty III, MD; Location: [facility]; Service: General; Laterality: N/A;  . STENT TO HEART     2 1997 AND 1 IN 1996  . TONSILLECTOMY    . XI ROBOTIC VAGOTOMY AND ANTRECTOMY N/A 09/25/2015   Procedure: XI ROBOTIC ANTERIOR AND POSTERIOR VAGOTOMY, BILROTH  I ANASTOMOSIS DOR FUNDIPLICATION, OMENTOPEXY UPPER ENDOSCOPY; Surgeon: Karie Soda, MD; Location: [facility]; Service: General; Laterality: N/A;        Family History  Problem Relation Age of Onset  . Heart attack Mother    Social History: reports that he quit smoking about 21 years ago. His smoking use included Cigarettes. He has a 40.00 pack-year smoking history. He has never used smokeless tobacco. He reports that he does not drink alcohol or use drugs.       Allergies  Allergen Reactions  . Flomax [Tamsulosin Hcl] Other (See Comments)    Leg weakness  . Lisinopril Cough  . Lorazepam     "i don't like it" Prefers Xanax or valium  . Uroxatral [Alfuzosin Hcl Er] Other (See Comments)    Leg weakness         Medications Prior to Admission  Medication Sig Dispense Refill  . aluminum-magnesium hydroxide-simethicone (MAALOX) 200-200-20 MG/5ML SUSP Take 30 mLs by mouth every 6 (six) hours as needed.    . Amino Ac Elect-Calc in D10W (CLINIMIX E/DEXTROSE, 2.75/10,) 2.75 % SOLN Inject 2,160 mLs into the vein as needed.    [name] aspirin 81 MG chewable tablet Place 81 mg into feeding tube every evening.    [name] atorvastatin (LIPITOR) 40 MG tablet Place 40 mg into feeding tube at bedtime.     . bisacodyl (DULCOLAX) 10 MG suppository Place 10 mg rectally daily as needed for moderate constipation or severe constipation.    . bismuth subsalicylate (PEPTO BISMOL) 262 MG/15ML suspension Place 30 mLs into feeding tube every 8 (eight) hours as needed for indigestion or diarrhea or loose stools.    [name]  calcium-vitamin D (OSCAL WITH D) 500-200 MG-UNIT tablet Place 1 tablet into feeding tube daily with breakfast.     . Cyanocobalamin (VITAMIN B-12) 1000 MCG SUBL Place 1 tablet under the tongue daily with breakfast.    . Ferrous Sulfate 220 (44 Fe) MG/5ML LIQD Place 7.5 mLs into feeding tube at bedtime.     [name] FLUoxetine (PROZAC) 20 MG/5ML solution Place 20 mg into feeding tube daily.    . haloperidol (HALDOL) 0.5 MG  tablet Take 0.25-0.5 mg by mouth 3 (three) times daily. At 6 am and 4 PM and 9 PM    . haloperidol (HALDOL) 0.5 MG tablet Place 0.5 mg into feeding tube as needed for agitation. STOP DATE 01/24/2016    . loperamide (IMODIUM) 1 MG/5ML solution 10 mg. Take 10 mls via tube every 8 hours as needed for more than 2 bms every 8 hours.    . magic mouthwash SOLN Take 15 mLs by mouth 4 (four) times daily as needed for mouth pain (sore throat). 90 mL 5  . metoCLOPramide (REGLAN) 5 MG/5ML solution Place 10 mg into feeding tube every 6 (six) hours as needed for nausea or vomiting.    . metoprolol tartrate (LOPRESSOR) 25 MG tablet Place 25 mg into feeding tube daily.    . nicotine (NICODERM CQ - DOSED IN MG/24 HR) 7 mg/24hr patch Place 7 mg onto the skin daily. Rotate site    . nitroGLYCERIN (NITROSTAT) 0.4 MG SL tablet Place 0.4 mg under the tongue every 5 (five) minutes as needed for chest pain.    . Nutritional Supplements (FEEDING SUPPLEMENT, OSMOLITE 1.2 CAL,) LIQD Place 237 mLs into feeding tube. Give 1 can via G-tube every day at 10 am and 6 pm.    . omeprazole (PRILOSEC) 40 MG capsule Take 40 mg by mouth 2 (two) times daily.    . Polyvinyl Alcohol-Povidone (REFRESH OP) Place 2 drops into both eyes as needed (For dry eyes.).     [name] potassium chloride (KCL) 2 mEq/mL SOLN oral liquid Take 30 mEq by mouth daily.    . RESTASIS 0.05 % ophthalmic emulsion Place 1 drop into both eyes 2 (two) times daily.     . traZODone (DESYREL) 50 MG tablet Place 100 mg into feeding tube at bedtime.     . Water For Irrigation, Sterile (STERILE WATER FOR IRRIGATION) Irrigate with 250 mLs as directed every 6 (six) hours.     Home:  Home Living  Family/patient expects to be discharged to:: Skilled nursing facility  Living Arrangements: Spouse/significant other  Available Help at Discharge: Family  Type of Home: House  Home Access: Stairs to enter  Secretary/administrator of Steps: 3  Entrance Stairs-Rails: Left, Right  Home  Layout: One level  Bathroom Shower/Tub: Pension scheme manager: Handicapped height  Home Equipment: None  Additional Comments: prior to last admission in October, pt discharged to SNF last admission and admitted from SNF  Functional History:  Prior Function  Level of Independence: Independent  Functional Status:  Mobility:  Bed Mobility  Overal bed mobility: Needs Assistance  Bed Mobility: Supine to Sit  Supine to sit: Min guard, Min assist  Sit to supine: Min guard  General bed mobility comments: OOB in recliner  Transfers  Overall transfer level: Needs assistance  Equipment used: Rolling walker (2 wheeled)  Transfers: Sit to/from Stand  Sit to Stand: Min assist, Mod assist  Stand pivot transfers: Min assist, Mod assist  General transfer comment: 25% VC's for  direction and pt able to self rise with proper hand placement and with a count of 1, 2, 3  Ambulation/Gait  Ambulation/Gait assistance: Min assist, +2 safety/equipment  Ambulation Distance (Feet): 240 Feet (80 feet x 3 with sitting rest breaks between)  Assistive device: Rolling walker (2 wheeled)  Gait Pattern/deviations: Step-through pattern, Decreased stride length, Drifts right/left  General Gait Details: pt tolerated an increased distance. Spouse assisted by following with recliner. demonstartes some anxiety about falling.  Gait velocity: decreased   ADL:  ADL  Overall ADL's : Needs assistance/impaired  Eating/Feeding: Set up  Eating/Feeding Details (indicate cue type and reason): pt can have a little water every hour  Grooming: Sitting, Minimal assistance  Grooming Details (indicate cue type and reason): pt needed max encouragement- declined BUE exercise even with much encouragement  Toilet Transfer: Cueing for safety, RW, Minimal assistance  Toilet Transfer Details (indicate cue type and reason): Pt was on BSC and walker to recliner on the other side of the bed with increased time and encouragement    Toileting- Clothing Manipulation and Hygiene: Maximal assistance, Sit to/from stand, Cueing for safety, Cueing for sequencing  General ADL Comments: Rehab admissions going to look at pts chart  Cognition:  Cognition  Overall Cognitive Status: Within Functional Limits for tasks assessed  Orientation Level: Oriented to person, Oriented to situation, Disoriented to time, Oriented to place  Cognition  Arousal/Alertness: Awake/alert  Behavior During Therapy: WFL for tasks assessed/performed  Overall Cognitive Status: Within Functional Limits for tasks assessed  General Comments: in good spirits- watching [employer] game  Difficult to assess due to: Tracheostomy  Blood pressure 116/65, pulse 90, temperature 98.4 F (36.9 C), temperature source Oral, resp. rate 17, height 6\' 2"  (1.88 m), weight 78.6 kg (173 lb 4.5 oz), SpO2 98 %.  Physical Exam  Constitutional: He is oriented to person, place, and time. No distress.  Frail appearing.  HENT:  Head: Normocephalic and atraumatic.  Right Ear: External ear normal.  Left Ear: External ear normal.  Eyes: EOM are normal. Pupils are equal, round, and reactive to light.  Neck:  Trach stoma closed  Cardiovascular: Normal rate and regular rhythm. Exam reveals no gallop and no friction rub.  No murmur heard.  Respiratory: No respiratory distress. He has no wheezes. He has no rales.  GI: He exhibits no distension. There is tenderness. There is rebound.  Large Eakins pouch in place over central abdomen with wound drainage contained/no leakage. LUQ drain with dry,brown material near entry point, brownish/yellow material through tube. LLQ drain without drainage at present.  Genitourinary: No penile tenderness.  Musculoskeletal: He exhibits edema (trace LE edema). He exhibits no deformity.  Neurological: He is alert and oriented to person, place, and time. He has normal reflexes. He displays normal reflexes. No cranial nerve deficit.  UE grossly 3-4/5 prox to  distal. LE: 2+ to 3/5 HF, 3/5 KE and 4-/5 ADF/PF. No gross sensory deficits in all 4 limbs.  Skin: Skin is warm and dry.   Lab Results Last 48 Hours  Imaging Results (Last 48 hours)     Medical Problem List and Plan:  1. Debility and encephalopathy secondary to small bowel obstruction and associated complications  -admit to inpatient rehab  2. DVT Prophylaxis/Anticoagulation: Pharmaceutical: Lovenox  3. Pain Management: Changed PCA to IV morphine every 4 hours prn severe pan  4. Mood: team to provide ego support. LCSW to follow for evaluation and support.  5. Neuropsych: This patient is not fully capable of making decisions on his own behalf.  6. Skin/Wound Care: Continue air mattress overlay. Continue large Eakin's pouch to manage wound drainage. WOC to follow and help with management. Encourage side lying to help manage MASD.  7. Malnutrition/Fluids/Electrolytes/Nutrition: continue TPN.  8. Abdominal abscess: Leucocytosis resolving. Continue rocephin  9. Anemia of chronic illness: Will recheck CBC in am. Transfuse if H/H drops lower or patient symptomatic.  10. Electrolyte abnormality/Acute renal insufficiency: Managed by TNA  11. Encephalopathy: Will change haldol to IM prn for management of agitation/anxiety. (pt aware of IM administration route)  Post Admission Physician Evaluation:  1. Functional deficits secondary to debility/encephalopathy. 2. Patient is admitted  to receive collaborative, interdisciplinary care between the physiatrist, rehab nursing staff, and therapy team. 3. Patient's level of medical complexity and substantial therapy needs in context of that medical necessity cannot be provided at a lesser intensity of care such as a SNF. 4. Patient has experienced substantial functional loss from his/her baseline which was documented above under the "Functional History" and "Functional Status" headings. Judging by the patient's diagnosis, physical exam, and functional history, the patient has potential for functional progress which will result in measurable gains while on inpatient rehab. These gains will be of substantial and practical use upon discharge in facilitating mobility and self-care at the household level. 5. Physiatrist will provide 24 hour management of medical needs as well as oversight of the therapy plan/treatment and provide guidance as appropriate regarding the interaction of the two. 6. The Preadmission Screening has been reviewed and patient status is unchanged unless otherwise stated above. 7. 24 hour rehab nursing will assist with bladder management, bowel management, safety, skin/wound care, disease management, medication administration, pain management and patient education and help integrate therapy concepts, techniques,education, etc. 8. PT will assess and treat for/with: Lower extremity strength, range of motion, stamina, balance, functional mobility, safety, adaptive techniques and equipment, wound mgt, pain control, family education, community re-entry. Goals are: mod I to supervision. 9. OT will assess and treat for/with: ADL's, functional mobility, safety, upper extremity strength, adaptive techniques and equipment, activity tolerance, family ed, community reintegration. Goals are: mod I to supervision. Therapy may not yet proceed with showering this patient. 10. SLP will assess and treat for/with: cognition, family ed. Goals are:  supervision. 11. Case Management and Social Worker will assess and treat for psychological issues and discharge planning. 12. Team conference will be held weekly to assess progress toward goals and to determine barriers to discharge. 13. Patient will receive at least 3 hours of therapy per day at least 5 days per week. 14. ELOS: 10-14 days  15. Prognosis: excellent Ranelle Oyster, MD, [facility]  [facility] Physical Medicine & Rehabilitation  02/26/2016  Jacquelynn Cree, Cordelia Poche  02/26/2016

## 2016-02-26 NOTE — H&P (Signed)
Physical Medicine and Rehabilitation Admission H&P    Chief Complaint  Patient presents with  . debility    HPI:  [name] is a 76 year old male with history of CAD, chronic back pain, COPD, GERD with dysphonia, PUD due to ulcer and abdominal pain who underwent distal gastrectomy with vagotomy 09/2015 due to acquired stricture. Post op course complicated by gastric ileus requiring jejunostomy, encephalopathy and FTT. He developed gastric leak due to jejunal disruption after pulling out his tube and required wash out with omentopexy 10/19,  VDRF --tracheostomy,  TPN as well as tube feeds and discharged to SNF for care and monitoring.  He was readmitted on 01/19/16 due to abdominal pain with severe vomiting and unable to tolerate oral intake.  He was found to have partial SBO treated conservatively without improvement and taken to OR on 12/23 for exp lap with lysis of adhesions, segmental SB resection and gastrojejunostomy.  He developed drainage of enteric contents from midline incision on 12/29 and was found to have LLQ abscess with EC fistula requiring percutaneous drain placed by IVR on 12/31.  Drainage from EC fistula from midline incision managed with use of Eakin pouch.   Intraabdominal wound cultures positive for moderate Klebsiella pneumoniae, moderate E coli and moderate bacteriodes thetaiotaomicron betalactamase positive. Respiratory status stable without significant secretions and patient decannulated on 01/10.  Dr. Johney Maine feels that fistula likely gastrojejunostomy and  should heal with bowel rest --NPO and TNA to help with nutritional supplement. Surgery if fistula present at 6-12 months. LLQ drain injection 01/11 revealed fistula to SB--JP drain changed out to gravity bag. Antibiotics narrowed to Rocephin on 01/3 Mentation improving with confusion resolving and ongoing anxiety/agitation at nights. Therapy ongoing and patient showing improvement in activity tolerance and mobility. CIR  recommended by rehab team.    Review of Systems  Constitutional: Negative for fever.  HENT: Negative for ear discharge and ear pain.   Eyes: Negative for blurred vision and double vision.  Respiratory: Positive for shortness of breath. Negative for cough.   Cardiovascular: Positive for leg swelling.  Gastrointestinal: Positive for abdominal pain and nausea. Negative for heartburn.  Genitourinary: Negative for dysuria.  Musculoskeletal: Positive for myalgias.  Skin: Positive for rash.  Neurological: Positive for dizziness.  Endo/Heme/Allergies: Does not bruise/bleed easily.  Psychiatric/Behavioral: Negative for suicidal ideas.      Past Medical History:  Diagnosis Date  . Bilateral dry eyes   . Bile peritonitis due to gastric tube dislodgement 12/03/2015  . BPH (benign prostatic hyperplasia)   . Central pterygium of right eye   . Coronary artery disease 1997, 2009   bare mental stent right coronart artery, occlusive followed by Dr. Pernell Dupre in [place]  . Degenerative disc disease    LOWER BACK  . Dysphonia   . ED (erectile dysfunction)   . Gastric outlet obstruction 11/24/2015  . Gastroesophageal reflux disease   . Hyperlipidemia   . Hyperopia of both eyes with astigmatism and presbyopia   . Hypertension   . Nuclear senile cataract    Dr. [facility] in [place]  . Peptic ulcer   . Pneumatosis of intestines 12/11/2015  . Pneumonia    HISTORY OF IN CHILDHOOD  . Posterior vitreous detachment 10/30/2012  . Salzmann's nodular dystrophy of left eye   . Urinary hesitancy   . Wears glasses     Past Surgical History:  Procedure Laterality Date  . CARDIAC CATHETERIZATION    . CHOLECYSTECTOMY    .  ESOPHAGOGASTRODUODENOSCOPY N/A 12/01/2015   Procedure: ESOPHAGOGASTRODUODENOSCOPY (EGD) BALLOON DILATION OF DUODENAL STRICTURE;  Surgeon: Michael Boston, MD;  Location: [facility];  Service: General;  Laterality: N/A;  . ESOPHAGOGASTRODUODENOSCOPY N/A 12/14/2015    Procedure: ESOPHAGOGASTRODUODENOSCOPY (EGD);  Surgeon: Clarene Essex, MD;  Location: [facility];  Service: Endoscopy;  Laterality: N/A;  Patient has tracheostomy  . ESOPHAGOGASTRODUODENOSCOPY (EGD) WITH PROPOFOL N/A 07/01/2015   Procedure: ESOPHAGOGASTRODUODENOSCOPY (EGD) WITH PROPOFOL;  Surgeon: Arta Silence, MD;  [facility]: [facility];  Service: Endoscopy;  Laterality: N/A;  . ESOPHAGOGASTRODUODENOSCOPY (EGD) WITH PROPOFOL N/A 11/23/2015   Procedure: ESOPHAGOGASTRODUODENOSCOPY (EGD) WITH PROPOFOL;  Surgeon: Arta Silence, MD;  Location: [facility] END[facility]COPY;  Service: Endoscopy;  Laterality: N/A;  may need to intubate  . EYE SURGERY     growth on right eye removed   . IR GENERIC HISTORICAL  12/13/2015   IR REPLC DUODEN/JEJUNO TUBE PERCUT W/FLUORO 12/13/2015 Marybelle Killings, MD [facility]  . IR GENERIC HISTORICAL  02/03/2016   IR [place] GASTRO/COLONIC TUBE PERCUT W/FLUORO 02/03/2016 Greggory Keen, MD [facility]  . IR GENERIC HISTORICAL  02/04/2016   IR GASTR TUBE CONVERT GASTR-JEJ PER W/FL MOD SED 02/04/2016 Jacqulynn Cadet, MD [facility]  . IR GENERIC HISTORICAL  02/25/2016   IR SINUS/FIST TUBE CHK-NON GI 02/25/2016 Aletta Edouard, MD [facility]  . LAPAROSCOPIC GASTROSTOMY N/A 12/01/2015   Procedure: LAPAROSCOPIC PLACEMENT OF FEEDING JEJUNOSTOMY AND GASTROSTOMY TUBE;  Surgeon: Michael Boston, MD;  Location[facility];  Service: General;  Laterality: N/A;  . LAPAROSCOPY N/A 12/03/2015   Procedure: LAPAROSCOPY DIAGNOSTIC, OMENTOPEXY, JEJUNOSTOMY, [place] OUT;  Surgeon: Michael Boston, MD;  Location[facility];  Service: General;  Laterality: N/A;  . LAPAROTOMY N/A 02/06/2016   Procedure: EXPLORATORY LAPAROTOMY, LYSIS OF ADHESIONS, SEGMENTAL SMALL BOWEL RESECTION, GASTROJEJUNOSTOMY;  Surgeon: Autumn Messing III, MD;  Location: [facility];  Service: General;  Laterality: N/A;  . STENT TO HEART     2 1997 AND 1 IN 1996  . TONSILLECTOMY    . XI ROBOTIC VAGOTOMY AND ANTRECTOMY N/A 09/25/2015   Procedure: XI ROBOTIC  ANTERIOR AND POSTERIOR VAGOTOMY, BILROTH I  ANASTOMOSIS DOR FUNDIPLICATION, OMENTOPEXY  UPPER ENDOSCOPY;  Surgeon: Michael Boston, MD;  Location: [facility];  Service: General;  Laterality: N/A;    Family History  Problem Relation Age of Onset  . Heart attack Mother     Social History:  reports that he quit smoking about 21 years ago. His smoking use included Cigarettes. He has a 40.00 pack-year smoking history. He has never used smokeless tobacco. He reports that he does not drink alcohol or use drugs.    Allergies  Allergen Reactions  . Flomax [Tamsulosin Hcl] Other (See Comments)    Leg weakness  . Lisinopril Cough  . Lorazepam     "i don't like it"  Prefers Xanax or valium  . Uroxatral [Alfuzosin Hcl Er] Other (See Comments)    Leg weakness    Medications Prior to Admission  Medication Sig Dispense Refill  . aluminum-magnesium hydroxide-simethicone (MAALOX) 665-993-57 MG/5ML SUSP Take 30 mLs by mouth every 6 (six) hours as needed.    . Amino Ac Elect-Calc in D10W (CLINIMIX E/DEXTROSE, 2.75/10,) 2.75 % SOLN Inject 2,160 mLs into the vein as needed.    [name] aspirin 81 MG chewable tablet Place 81 mg into feeding tube every evening.    [name] atorvastatin (LIPITOR) 40 MG tablet Place 40 mg into feeding tube at bedtime.     . bisacodyl (DULCOLAX) 10 MG suppository Place 10 mg rectally daily  as needed for moderate constipation or severe constipation.    . bismuth subsalicylate (PEPTO BISMOL) 262 MG/15ML suspension Place 30 mLs into feeding tube every 8 (eight) hours as needed for indigestion or diarrhea or loose stools.    . calcium-vitamin D (OSCAL WITH D) 500-200 MG-UNIT tablet Place 1 tablet into feeding tube daily with breakfast.     . Cyanocobalamin (VITAMIN B-12) 1000 MCG SUBL Place 1 tablet under the tongue daily with breakfast.    . Ferrous Sulfate 220 (44 Fe) MG/5ML LIQD Place 7.5 mLs into feeding tube at bedtime.     [name] FLUoxetine (PROZAC) 20 MG/5ML solution Place 20 mg into feeding tube  daily.    . haloperidol (HALDOL) 0.5 MG tablet Take 0.25-0.5 mg by mouth 3 (three) times daily. At 6 am and 4 PM and 9 PM    . haloperidol (HALDOL) 0.5 MG tablet Place 0.5 mg into feeding tube as needed for agitation. STOP DATE 01/24/2016    . loperamide (IMODIUM) 1 MG/5ML solution 10 mg. Take 10 mls via tube every 8 hours as needed for more than 2 bms every 8 hours.    . magic mouthwash SOLN Take 15 mLs by mouth 4 (four) times daily as needed for mouth pain (sore throat). 90 mL 5  . metoCLOPramide (REGLAN) 5 MG/5ML solution Place 10 mg into feeding tube every 6 (six) hours as needed for nausea or vomiting.    . metoprolol tartrate (LOPRESSOR) 25 MG tablet Place 25 mg into feeding tube daily.    . nicotine (NICODERM CQ - DOSED IN MG/24 HR) 7 mg/24hr patch Place 7 mg onto the skin daily. Rotate site    . nitroGLYCERIN (NITROSTAT) 0.4 MG SL tablet Place 0.4 mg under the tongue every 5 (five) minutes as needed for chest pain.    . Nutritional Supplements (FEEDING SUPPLEMENT, OSMOLITE 1.2 CAL,) LIQD Place 237 mLs into feeding tube. Give 1 can via G-tube every day at  10 am and  6 pm.    . omeprazole (PRILOSEC) 40 MG capsule Take 40 mg by mouth 2 (two) times daily.    . Polyvinyl Alcohol-Povidone (REFRESH OP) Place 2 drops into both eyes as needed (For dry eyes.).     [name] potassium chloride (KCL) 2 mEq/mL SOLN oral liquid Take 30 mEq by mouth daily.    . RESTASIS 0.05 % ophthalmic emulsion Place 1 drop into both eyes 2 (two) times daily.     . traZODone (DESYREL) 50 MG tablet Place 100 mg into feeding tube at bedtime.     . Water For Irrigation, Sterile (STERILE WATER FOR IRRIGATION) Irrigate with 250 mLs as directed every 6 (six) hours.      Home: Home Living Family/patient expects to be discharged to:: [place]: Spouse/significant other Available Help at Discharge: Family Type of Home: House Home Access: Stairs to enter Technical brewer of Steps:  3 Entrance Stairs-Rails: Left, Right Home Layout: One level Bathroom Shower/Tub: Multimedia programmer: Handicapped height Home Equipment: None Additional Comments: prior to last admission in October, pt discharged to SNF last admission and admitted from SNF   Functional History: Prior Function Level of Independence: Independent  Functional Status:  Mobility: Bed Mobility Overal bed mobility: Needs Assistance Bed Mobility: Supine to Sit Supine to sit: Min guard, Min assist Sit to supine: Min guard General bed mobility comments: OOB in recliner Transfers Overall transfer level: Needs assistance Equipment used: Rolling walker (2 wheeled) Transfers: Sit to/from Stand Sit to  Stand: Min assist, Mod assist Stand pivot transfers: Min assist, Mod assist General transfer comment: 25% VC's for direction and pt able to self rise with proper hand placement and with a count of 1, 2, 3 Ambulation/Gait Ambulation/Gait assistance: Min assist, +2 safety/equipment Ambulation Distance (Feet): 240 Feet (80 feet x 3 with sitting rest breaks between) Assistive device: Rolling walker (2 wheeled) Gait Pattern/deviations: Step-through pattern, Decreased stride length, Drifts right/left General Gait Details: pt tolerated an increased distance.  Spouse assisted by following with recliner. demonstartes some anxiety about falling.  Gait velocity: decreased    ADL: ADL Overall ADL's : Needs assistance/impaired Eating/Feeding: Set up Eating/Feeding Details (indicate cue type and reason): pt can have a little water every hour Grooming: Sitting, Minimal assistance Grooming Details (indicate cue type and reason): pt needed max encouragement- declined BUE exercise even with much encouragement Toilet Transfer: Cueing for safety, RW, Minimal assistance Toilet Transfer Details (indicate cue type and reason): Pt was on BSC and walker to recliner on the other side of the bed with increased time and  encouragement Toileting- Clothing Manipulation and Hygiene: Maximal assistance, Sit to/from stand, Cueing for safety, Cueing for sequencing General ADL Comments: Rehab admissions going to look at pts chart  Cognition: Cognition Overall Cognitive Status: Within Functional Limits for tasks assessed Orientation Level: Oriented to person, Oriented to situation, Disoriented to time, Oriented to place Cognition Arousal/Alertness: Awake/alert Behavior During Therapy: WFL for tasks assessed/performed Overall Cognitive Status: Within Functional Limits for tasks assessed General Comments: in good spirits- watching [employer] game Difficult to assess due to: Tracheostomy   Blood pressure 116/65, pulse 90, temperature 98.4 F (36.9 C), temperature source Oral, resp. rate 17, height 6' 2"  (1.88 m), weight 78.6 kg (173 lb 4.5 oz), SpO2 98 %. Physical Exam  Constitutional: He is oriented to person, place, and time. No distress.  Frail appearing.   HENT:  Head: Normocephalic and atraumatic.  Right Ear: External ear normal.  Left Ear: External ear normal.  Eyes: EOM are normal. Pupils are equal, round, and reactive to light.  Neck:  Trach stoma closed  Cardiovascular: Normal rate and regular rhythm.  Exam reveals no gallop and no friction rub.   No murmur heard. Respiratory: No respiratory distress. He has no wheezes. He has no rales.  GI: He exhibits no distension. There is tenderness. There is rebound.  Large Eakins pouch in place over central abdomen with wound drainage contained/no leakage. LUQ drain with dry,brown material near entry point, brownish/yellow material through tube. LLQ drain without drainage at present.   Genitourinary: No penile tenderness.  Musculoskeletal: He exhibits edema (trace LE edema). He exhibits no deformity.  Neurological: He is alert and oriented to person, place, and time. He has normal reflexes. He displays normal reflexes. No cranial nerve deficit.  UE grossly 3-4/5  prox to distal. LE: 2+ to 3/5 HF, 3/5 KE and 4-/5 ADF/PF. No gross sensory deficits in all 4 limbs.   Skin: Skin is warm and dry.    Results for orders placed or performed during the hospital encounter of 01/19/16 (from the past 48 hour(s))  Glucose, capillary     Status: None   Collection Time: 02/24/16 11:56 AM  Result Value Ref Range   Glucose-Capillary 88 65 - 99 mg/dL  Glucose, capillary     Status: None   Collection Time: 02/24/16  6:10 PM  Result Value Ref Range   Glucose-Capillary 92 65 - 99 mg/dL  Glucose, capillary     Status: Abnormal  Collection Time: 02/24/16 11:36 PM  Result Value Ref Range   Glucose-Capillary 117 (H) 65 - 99 mg/dL  Magnesium     Status: None   Collection Time: 02/25/16  3:51 AM  Result Value Ref Range   Magnesium 1.7 1.7 - 2.4 mg/dL  Phosphorus     Status: None   Collection Time: 02/25/16  3:51 AM  Result Value Ref Range   Phosphorus 4.1 2.5 - 4.6 mg/dL  CBC     Status: Abnormal   Collection Time: 02/25/16  3:51 AM  Result Value Ref Range   WBC 10.7 (H) 4.0 - 10.5 K/uL   RBC 2.74 (L) 4.22 - 5.81 MIL/uL   Hemoglobin 7.9 (L) 13.0 - 17.0 g/dL   HCT 24.8 (L) 39.0 - 52.0 %   MCV 90.5 78.0 - 100.0 fL   MCH 28.8 26.0 - 34.0 pg   MCHC 31.9 30.0 - 36.0 g/dL   RDW 15.6 (H) 11.5 - 15.5 %   Platelets 585 (H) 150 - 400 K/uL  Glucose, capillary     Status: Abnormal   Collection Time: 02/25/16  5:57 AM  Result Value Ref Range   Glucose-Capillary 115 (H) 65 - 99 mg/dL  Glucose, capillary     Status: None   Collection Time: 02/25/16 12:52 PM  Result Value Ref Range   Glucose-Capillary 99 65 - 99 mg/dL  Glucose, capillary     Status: Abnormal   Collection Time: 02/25/16  6:37 PM  Result Value Ref Range   Glucose-Capillary 103 (H) 65 - 99 mg/dL  Glucose, capillary     Status: Abnormal   Collection Time: 02/25/16 11:35 PM  Result Value Ref Range   Glucose-Capillary 116 (H) 65 - 99 mg/dL  CBC     Status: Abnormal   Collection Time: 02/26/16  4:05 AM    Result Value Ref Range   WBC 10.1 4.0 - 10.5 K/uL   RBC 2.71 (L) 4.22 - 5.81 MIL/uL   Hemoglobin 7.7 (L) 13.0 - 17.0 g/dL   HCT 24.2 (L) 39.0 - 52.0 %   MCV 89.3 78.0 - 100.0 fL   MCH 28.4 26.0 - 34.0 pg   MCHC 31.8 30.0 - 36.0 g/dL   RDW 15.5 11.5 - 15.5 %   Platelets 622 (H) 150 - 400 K/uL  Comprehensive metabolic panel     Status: Abnormal   Collection Time: 02/26/16  4:05 AM  Result Value Ref Range   Sodium 136 135 - 145 mmol/L   Potassium 3.9 3.5 - 5.1 mmol/L   Chloride 105 101 - 111 mmol/L   CO2 25 22 - 32 mmol/L   Glucose, Bld 118 (H) 65 - 99 mg/dL   BUN 33 (H) 6 - 20 mg/dL   Creatinine, Ser 0.61 0.61 - 1.24 mg/dL   Calcium 8.7 (L) 8.9 - 10.3 mg/dL   Total Protein 6.7 6.5 - 8.1 g/dL   Albumin 1.6 (L) 3.5 - 5.0 g/dL   AST 30 15 - 41 U/L   ALT 22 17 - 63 U/L   Alkaline Phosphatase 204 (H) 38 - 126 U/L   Total Bilirubin 0.2 (L) 0.3 - 1.2 mg/dL   GFR calc non Af Amer >60 >60 mL/min   GFR calc Af Amer >60 >60 mL/min    Comment: (NOTE) The eGFR has been calculated using the CKD EPI equation. This calculation has not been validated in all clinical situations. eGFR's persistently <60 mL/min signify possible Chronic Kidney Disease.    Anion gap  6 5 - 15  Glucose, capillary     Status: Abnormal   Collection Time: 02/26/16  5:05 AM  Result Value Ref Range   Glucose-Capillary 134 (H) 65 - 99 mg/dL   Ir Sinus/fist Tube Chk-non Gi  Result Date: 02/25/2016 INDICATION: Status post percutaneous drainage of peritoneal abscess on 02/14/2016 following prior gastric surgery, laparoscopic gastrostomy and small bowel resection. EXAM: SINUS TRACT INJECTION / FISTULOGRAM CONTRAST:  10 mL Isovue-300 FLUOROSCOPY TIME:  1 minute.  22.9 mGy. MEDICATIONS: None. ANESTHESIA/SEDATION: None. COMPLICATIONS: None. TECHNIQUE: The pre-existing left lower quadrant peritoneal percutaneous drainage catheter was injected with contrast. Multiple fluoroscopic spot images were obtained. The drainage catheter  was then flushed and reconnected to a suction bulb. PROCEDURE: See above. FINDINGS: There is initial filling of an irregular abscess cavity. With further distension of the cavity, there is opacification of adjacent small bowel near the level of a pre-existing gastrojejunal feeding catheter. Transit of contrast is seen more distally into small bowel within the right lower quadrant. IMPRESSION: The percutaneous drainage catheter injection is positive for fistula to adjacent small bowel. Electronically Signed   By: Aletta Edouard M.D.   On: 02/25/2016 16:34       Medical Problem List and Plan: 1.  Debility and encephalopathy secondary to small bowel obstruction and associated complications  -admit to inpatient rehab 2.  DVT Prophylaxis/Anticoagulation: Pharmaceutical: Lovenox 3. Pain Management: Changed PCA to IV morphine every 4 hours prn severe pan 4. Mood: team to provide ego support. LCSW to follow for evaluation and support. 5. Neuropsych: This patient is not fully  capable of making decisions on his own behalf. 6. Skin/Wound Care: Continue air mattress overlay. Continue large Eakin's pouch to manage wound drainage. WOC to follow and help with management. Encourage side lying to help manage MASD.  7. Malnutrition/Fluids/Electrolytes/Nutrition: continue TPN.  8. Abdominal abscess: Leucocytosis resolving. Continue rocephin  9.  Anemia of chronic illness: Will recheck CBC in am. Transfuse if H/H drops lower or patient symptomatic.  10. Electrolyte abnormality/Acute renal insufficiency: Managed by TNA 11. Encephalopathy: Will change haldol to IM prn for management of agitation/anxiety. (pt aware of IM administration route)   Post Admission Physician Evaluation: 1. Functional deficits secondary  to debility/encephalopathy. 2. Patient is admitted to receive collaborative, interdisciplinary care between the physiatrist, rehab nursing staff, and therapy team. 3. Patient's level of medical  complexity and substantial therapy needs in context of that medical necessity cannot be provided at a lesser intensity of care such as a SNF. 4. Patient has experienced substantial functional loss from his/her baseline which was documented above under the "Functional History" and "Functional Status" headings.  Judging by the patient's diagnosis, physical exam, and functional history, the patient has potential for functional progress which will result in measurable gains while on inpatient rehab.  These gains will be of substantial and practical use upon discharge  in facilitating mobility and self-care at the household level. 5. Physiatrist will provide 24 hour management of medical needs as well as oversight of the therapy plan/treatment and provide guidance as appropriate regarding the interaction of the two. 6. The Preadmission Screening has been reviewed and patient status is unchanged unless otherwise stated above. 7. 24 hour rehab nursing will assist with bladder management, bowel management, safety, skin/wound care, disease management, medication administration, pain management and patient education  and help integrate therapy concepts, techniques,education, etc. 8. PT will assess and treat for/with: Lower extremity strength, range of motion, stamina, balance, functional mobility, safety, adaptive techniques  and equipment, wound mgt, pain control, family education, community re-entry.   Goals are: mod I to supervision. 9. OT will assess and treat for/with: ADL's, functional mobility, safety, upper extremity strength, adaptive techniques and equipment, activity tolerance, family ed, community reintegration.   Goals are: mod I to supervision. Therapy may not yet proceed with showering this patient. 10. SLP will assess and treat for/with: cognition, family ed.  Goals are: supervision. 11. Case Management and Social Worker will assess and treat for psychological issues and discharge planning. 12. Team  conference will be held weekly to assess progress toward goals and to determine barriers to discharge. 13. Patient will receive at least 3 hours of therapy per day at least 5 days per week. 14. ELOS: 10-14 days       15. Prognosis:  excellent     Meredith Staggers, MD, [facility] 02/26/2016  Bary Leriche, Hershal Coria 02/26/2016

## 2016-02-26 NOTE — Progress Notes (Signed)
[facility]  Mr. [name] [facility].  [name] [facility]. I have been in contact with [name], the patients daughter every few days to"check in" to support pt and family with [facility].  I spoke with [name] this a.m. and advised we will continue to follow pt and support DC planning as the hospital and family decide. [name] voiced they would like to take pt home, but realize he needs significant rehab and conditioning first. [facility] will follow in CIR and support as needed.   If patient discharges after hours, please call [phone].   [name] 02/26/2016, 12:43 PM

## 2016-02-26 NOTE — PMR Pre-admission (Signed)
[place] PMR Admission Coordinator Pre-Admission Assessment  Patient: [name] is an 76 y.o., male MRN: [id number] DOB: 04-03-1940 Height: 6' 2"  (188 cm) Weight: 78.6 kg (173 lb 4.5 oz)  Insurance Information HMO: Yes   PPO:       PCP:       IPA:       80/20:       OTHER:  Group # [id number] PRIMARY:  [employer] medicare      Policy#: [id number]      Subscriber:  Tye Savoy CM Name:  Sherlynn Stalls      Phone#: [phone]     Fax#: [phone] Pre-Cert#: [id number]      Employer: Retired Benefits:  Phone #: [phone]     Name:  Hanley Seamen. Date: 02-15-16     Deduct:  $0      Out of Pocket Max: $4400      Life Max: unlimited CIR: $345 days 1-5      SNF: $0 days 1-20; $160 days 21-48; $0 days 49-100 Outpatient:  Medical necessity     Co-Pay: $40/visit Home Health: 100%      Co-Pay: none DME: 80%     Co-Pay: 20% Providers: in network  Emergency Contact Information Contact Information    Name Relation Home Work Mobile   Kyley, Laurel Spouse [phone]  [phone]   Blackman,[name] Daughter   [phone]      Current Medical History  Patient Admitting Diagnosis:  Debility  History of Present Illness: A 76 year old male with history of CAD, chronic back pain, COPD, GERD with dysphonia, PUD due to ulcer and abdominal pain who underwent distal gastrectomy with vagotomy 09/2015 due to acquired stricture. Post op course complicated by gastric ileus requiring jejunostomy, encephalopathy and FTT. He developed gastric leak due to jejunal disruption after pulling out his tube and required wash out with omentopexy 10/19,  VDRF --tracheostomy,  TPN as well as tube feeds and discharged to SNF for care and monitoring.  He was readmitted on 01/19/16 due to abdominal pain with severe vomiting and unable to tolerate oral intake.  He was found to have partial SBO treated conservatively without improvement and taken to OR on 12/23 for exp lap with lysis of adhesions, segmental SB  resection and gastrojejunostomy.  He developed drainage of enteric contents from midline incision on 12/29 and was found to have LLQ abscess with ED fistula requiring percutaneous drain placed by IVR on 12/31.  He continues on TPN for severe malnutrition and a few ice chips.  He was decanulated 02/24/16 and trach dressing is in place.    Patient's medical record from  [facility] has been reviewed by the rehabilitation admission coordinator and physician.  Past Medical History  Past Medical History:  Diagnosis Date  . Bilateral dry eyes   . Bile peritonitis due to gastric tube dislodgement 12/03/2015  . BPH (benign prostatic hyperplasia)   . Central pterygium of right eye   . Coronary artery disease 1997, 2009   bare mental stent right coronart artery, occlusive followed by Dr. Pernell Dupre in [place]  . Degenerative disc disease    LOWER BACK  . Dysphonia   . ED (erectile dysfunction)   . Gastric outlet obstruction 11/24/2015  . Gastroesophageal reflux disease   . Hyperlipidemia   . Hyperopia of both eyes with astigmatism and presbyopia   . Hypertension   . Nuclear senile cataract    [facility]. [facility] in [place]  .  Peptic ulcer   . Pneumatosis of intestines 12/11/2015  . Pneumonia    HISTORY OF IN CHILDHOOD  . Posterior vitreous detachment 10/30/2012  . Salzmann's nodular dystrophy of left eye   . Urinary hesitancy   . Wears glasses     Family History   family history includes Heart attack in his mother.  Prior Rehab/Hospitalizations Has the patient had major surgery during 100 days prior to admission? Yes.  Initially had surgery 09/25/15, then again 12/01/15, then 12/03/15 and then recent surgery 02/06/16.  He went to [facility] after surgery 12/01/15 for 20 days.  He had [facility] for [facility] for 4 weeks after surgery in August.    Current Medications See MAR from [facility]  Patients Current Diet:   NPO with TPN ongoing.  Precautions  / Restrictions Precautions Precautions: Fall Precaution Comments: recent LAP with mod ABD drainage, 1 drains, 1 bulb    , PCA, trach capped Restrictions Weight Bearing Restrictions: No   Has the patient had 2 or more falls or a fall with injury in the past year?No  Prior Activity Level Community (5-7x/wk): Went to exercise class 3 X a week up until 04/17.  Went out of house 3-4 X a week until 09/29/15.  Prior Functional Level Self Care: Did the patient need help bathing, dressing, using the toilet or eating?  Independent  Indoor Mobility: Did the patient need assistance with walking from room to room (with or without device)? Independent  Stairs: Did the patient need assistance with internal or external stairs (with or without device)? Independent  Functional Cognition: Did the patient need help planning regular tasks such as shopping or remembering to take medications? [place] / Equipment Home Assistive Devices/Equipment: Enteral Feeding Supplies, Feeding equipment, Eyeglasses, Grab bars around toilet, Grab bars in shower, Hand-held shower hose, Wheelchair, [place] [facility] bed, Oxygen, Nebulizer, Vent/Trach supplies Home Equipment: None  Prior Device Use: Indicate devices/aids used by the patient prior to current illness, exacerbation or injury? Manual wheelchair and Walker   Prior Functional Level Current Functional Level  Bed Mobility  Independent  Mod assist   Transfers  Independent  Mod assist   Mobility - Walk/Wheelchair  Independent  Min assist (Ambulated 185 ft +2 RW with rest breaks.  Initial LOB and posterior lean)   Upper Body Dressing  Independent      Lower Body Dressing  Independent      Grooming  Independent  Min assist   Eating/Drinking  Other (Has not eaten for the past 4 months.)  Other (Set up for few ice chips only.)   Toilet Transfer  Independent  Min assist (Max assist for toilet hygiene and clothing  manipulation.)   Bladder Continence   WDL  Using urinal   Bowel Management  WDL  No BM since 02/08/16 according to chart.  No BM since 12/11 according to wife/daughter.   Stair Retail banker Other (Not tried.)   Communication  Verbal and intact  Speaks softly, verbal   Memory  Mild confusion early mornings, but then clears.  Mild impairment   Cooking/Meal Prep  Wife does cooking and meal prep.      Housework  Wife does housework.    Money Management  Independent    Driving  Independent     Special needs/care consideration BiPAP/CPAP No CPM No Continuous Drip IV TPN for malnutrition Dialysis No      Life Vest No Oxygen No Special Bed No Trach  Size No Wound Vac (area) No     Skin Has stage II sacral decubitus; has open abdominal wound with pouch and drain present in left abdomen.                          Bowel mgmt: Last BM 02/08/16 per chart; wife reports no BM since 01/25/16 Bladder mgmt: Using urinal Diabetic mgmt No  Previous Home Environment Living Arrangements: Spouse/significant other Available Help at Discharge: Family Type of Home: [place] Name: [place] and rehab Home Layout: One level Home Access: Stairs to enter Entrance Stairs-Rails: Left, Right Entrance Stairs-Number of Steps: 3 Bathroom Shower/Tub: Multimedia programmer: Handicapped height [place]: No Additional Comments: prior to last admission in October, pt discharged to SNF last admission and admitted from SNF  Discharge Living Setting Plans for Discharge Living Setting: Patient's home, House, Lives with (comment) (Lives with wife.) Type of Home at Discharge: House Discharge Home Layout: One level Discharge Home Access: Stairs to enter Entrance Stairs-Number of Steps: 5 steps per wife. Does the patient have any problems obtaining your medications?: No  Social/Family/Support Systems Patient Roles: Spouse, Parent (Has a wife and a  daughter.) Contact Information: Clancy Mullarkey - wife Anticipated Caregiver: wife Anticipated Caregiver's Contact Information: [name] - wife - (h) [phone] (c) [phone] Ability/Limitations of Caregiver: wife Caregiver Availability: 24/7 Discharge Plan Discussed with Primary Caregiver: Yes Is Caregiver In Agreement with Plan?: Yes Does Caregiver/Family have Issues with Lodging/Transportation while Pt is in Rehab?: No  Goals/Additional Needs Patient/Family Goal for Rehab: PT/OT mod I and supervision, SLF supervision to min assist goals Expected length of stay: 10-14 days Cultural Considerations: None Dietary Needs: Currently NPO with TPN on going Equipment Needs: TBD Special Service Needs: Currently patient is on TPN and likely will go home on TPN. Additional Information: Patient has a long term care policy per wife and she will have some assistance from them after discharge. Pt/Family Agrees to Admission and willing to participate: Yes Program Orientation Provided & Reviewed with Pt/Caregiver Including Roles  & Responsibilities: Yes  Patient Condition:  I met with patient, his wife and his daughter at the bedside.  I have reviewed all clinicals and feel this patient would benefit from the intense therapies of the inpatient rehab program.  He can tolerate and benefit from 3 hours of therapy a day to regain functional independence so that he can discharge home with his family.  I have shared all findings with rehab MD and I have approval for acute inpatient rehab admission for today.  Preadmission Screen Completed By:  Retta Diones, 02/26/2016 1:54 PM ______________________________________________________________________   Discussed status with Dr. Naaman Plummer on 02/26/16 at 1437 and received telephone approval for admission today.  Admission Coordinator:  Retta Diones, time 1437/Date 02/26/16   Assessment/Plan: Diagnosis: debility related to small bowel obstruction and  subsequent complications 1. Does the need for close, 24 hr/day  Medical supervision in concert with the patient's rehab needs make it unreasonable for this patient to be served in a less intensive setting? Yes 2. Co-Morbidities requiring supervision/potential complications: chronic daily wound care/ostomy mgt, malnutrition requiring TPN, pain control requiring daily adjustment 3. Due to bladder management, bowel management, safety, skin/wound care, disease management, medication administration, pain management and patient education, does the patient require 24 hr/day rehab nursing? Yes 4. Does the patient require coordinated care of a physician, rehab nurse, PT (1-2 hrs/day, 5 days/week), OT (1-2  hrs/day, 5 days/week) and SLP (1-2 hrs/day, 5 days/week) to address physical and functional deficits in the context of the above medical diagnosis(es)? Yes Addressing deficits in the following areas: balance, endurance, locomotion, strength, transferring, bowel/bladder control, bathing, dressing, feeding, grooming, toileting, cognition, swallowing and psychosocial support 5. Can the patient actively participate in an intensive therapy program of at least 3 hrs of therapy 5 days a week? Yes 6. The potential for patient to make measurable gains while on inpatient rehab is excellent 7. Anticipated functional outcomes upon discharge from inpatients are: modified independent and supervision PT, modified independent and supervision OT, supervision and min assist SLP 8. Estimated rehab length of stay to reach the above functional goals is: 10-14 days 9. Does the patient have adequate social supports to accommodate these discharge functional goals? Yes 10. Anticipated D/C setting: Home 11. Anticipated post D/C treatments: [place] therapy 12. Overall Rehab/Functional Prognosis: excellent    RECOMMENDATIONS: This patient's condition is appropriate for continued rehabilitative care in the following setting: CIR Patient  has agreed to participate in recommended program. Yes Note that insurance prior authorization may be required for reimbursement for recommended care.  Comment: admit to inpatient rehab today.   Meredith Staggers, MD, [facility] 02/26/2016   Retta Diones 02/26/2016

## 2016-02-26 NOTE — Consult Note (Signed)
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 Pouch applied Monday is intact with no evidence of pectin wasting or leakage. Measurement: (Per Monday) 18cm x 1.8cm x 2cm Wound GNF:[id number]:Visualized through pouch today:  Red, moist Drainage (amount, consistency, odor) dark yellow to brown effluent in pouch. Periwound:Not seen today Dressing procedure/placement/frequency: Procedure remains unchanged in the face of previous pouch's success. Next scheduled pouch change should be Monday or Tuesday, 1/15 or 1/16. (Spare pouch at bedside with 3 skin barrier rings, paper tape.  Pattern also at bedside.) Procedure: 1. Cleanse wound and surrounding skin with NS 2. Shave or clip any hair growth in area of pouch or taping area 3. Cut and stretch skin barrier rings to affix as "border" around wound, overlapping as required.  4. Place Eakin pouch 5. Place hands over pouch and apply gentle warming pressure.  Verify condensation "cloud" appears over midline wound 6. Apply 1-inch paper tape as border for pouch  Nursing staff to keep patient's pouch emptied whenever effluent accumulates in the tail closure or most distal portion of pouch.  [facility] after transfer to that campus. Report provided to D. Sylvie FarrierEngels and M. Austin.  Thanks. It has been a pleasure assisting in the care of this patient. Ladona MowLaurie Tykira Wachs, MSN, RN, GNP, Hans EdenCWOCN, CWON-AP, FAAN  Pager# [phone]

## 2016-02-26 NOTE — Clinical Social Work Note (Signed)
Patient being admitted into IP REHAB.  Medical Social Worker will sign off for now as social work intervention is no longer needed. Please consult us again if new need arises.  Derenda FennelBashira Kentley Cedillo, MSW [phone] 02/26/2016 12:43 PM

## 2016-02-26 NOTE — Progress Notes (Signed)
12ml of IV morphine from PCA pump wasted with 2nd RN Fuller CanadaJessica Good.

## 2016-02-27 ENCOUNTER — Inpatient Hospital Stay (HOSPITAL_COMMUNITY): Payer: Medicare Other | Admitting: *Deleted

## 2016-02-27 ENCOUNTER — Inpatient Hospital Stay (HOSPITAL_COMMUNITY): Payer: Medicare Other | Admitting: Physical Therapy

## 2016-02-27 ENCOUNTER — Inpatient Hospital Stay (HOSPITAL_COMMUNITY): Payer: Medicare Other | Admitting: Occupational Therapy

## 2016-02-27 DIAGNOSIS — R627 Adult failure to thrive: Secondary | ICD-10-CM | POA: Insufficient documentation

## 2016-02-27 DIAGNOSIS — G8918 Other acute postprocedural pain: Secondary | ICD-10-CM | POA: Insufficient documentation

## 2016-02-27 DIAGNOSIS — R739 Hyperglycemia, unspecified: Secondary | ICD-10-CM

## 2016-02-27 LAB — CBC WITH DIFFERENTIAL/PLATELET
BASOS ABS: 0.1 10*3/uL (ref 0.0–0.1)
Basophils Relative: 1 %
EOS PCT: 1 %
Eosinophils Absolute: 0.1 10*3/uL (ref 0.0–0.7)
HCT: 25.4 % — ABNORMAL LOW (ref 39.0–52.0)
HEMOGLOBIN: 8.3 g/dL — AB (ref 13.0–17.0)
LYMPHS ABS: 3.9 10*3/uL (ref 0.7–4.0)
Lymphocytes Relative: 39 %
MCH: 29.7 pg (ref 26.0–34.0)
MCHC: 32.7 g/dL (ref 30.0–36.0)
MCV: 91 fL (ref 78.0–100.0)
MONOS PCT: 12 %
Monocytes Absolute: 1.2 10*3/uL — ABNORMAL HIGH (ref 0.1–1.0)
NEUTROS ABS: 4.7 10*3/uL (ref 1.7–7.7)
Neutrophils Relative %: 47 %
Platelets: 599 10*3/uL — ABNORMAL HIGH (ref 150–400)
RBC: 2.79 MIL/uL — AB (ref 4.22–5.81)
RDW: 15.8 % — ABNORMAL HIGH (ref 11.5–15.5)
WBC: 10 10*3/uL (ref 4.0–10.5)

## 2016-02-27 LAB — COMPREHENSIVE METABOLIC PANEL
ALBUMIN: 1.4 g/dL — AB (ref 3.5–5.0)
ALK PHOS: 199 U/L — AB (ref 38–126)
ALT: 25 U/L (ref 17–63)
ANION GAP: 6 (ref 5–15)
AST: 31 U/L (ref 15–41)
BILIRUBIN TOTAL: 0.4 mg/dL (ref 0.3–1.2)
BUN: 27 mg/dL — AB (ref 6–20)
CALCIUM: 8.9 mg/dL (ref 8.9–10.3)
CO2: 24 mmol/L (ref 22–32)
CREATININE: 0.68 mg/dL (ref 0.61–1.24)
Chloride: 106 mmol/L (ref 101–111)
GFR calc Af Amer: >60 mL/min (ref >60)
GFR calc non Af Amer: >60 mL/min (ref >60)
GLUCOSE: 103 mg/dL — AB (ref 65–99)
Potassium: 3.8 mmol/L (ref 3.5–5.1)
SODIUM: 136 mmol/L (ref 135–145)
TOTAL PROTEIN: 6.5 g/dL (ref 6.5–8.1)

## 2016-02-27 LAB — GLUCOSE, CAPILLARY
GLUCOSE-CAPILLARY: 104 mg/dL — AB (ref 65–99)
GLUCOSE-CAPILLARY: 96 mg/dL (ref 65–99)
Glucose-Capillary: 120 mg/dL — ABNORMAL HIGH (ref 65–99)
Glucose-Capillary: 129 mg/dL — ABNORMAL HIGH (ref 65–99)

## 2016-02-27 LAB — MAGNESIUM: Magnesium: 1.7 mg/dL (ref 1.7–2.4)

## 2016-02-27 MED ORDER — MAGNESIUM SULFATE 2 GM/50ML IV SOLN
2.0000 g | Freq: Once | INTRAVENOUS | Status: AC
  Administered 2016-02-27: 2 g via INTRAVENOUS
  Filled 2016-02-27 (×3): qty 50

## 2016-02-27 MED ORDER — FAT EMULSION 20 % IV EMUL
250.0000 mL | INTRAVENOUS | Status: AC
  Administered 2016-02-27: 250 mL via INTRAVENOUS
  Filled 2016-02-27: qty 250

## 2016-02-27 MED ORDER — ALPRAZOLAM 0.5 MG PO TABS
0.5000 mg | ORAL_TABLET | Freq: Every evening | ORAL | Status: DC | PRN
  Administered 2016-02-28: 0.5 mg
  Filled 2016-02-27: qty 1

## 2016-02-27 MED ORDER — TRACE MINERALS CR-CU-MN-SE-ZN 10-1000-500-60 MCG/ML IV SOLN
INTRAVENOUS | Status: AC
Start: 2016-02-27 — End: 2016-02-28
  Administered 2016-02-27: 18:00:00 via INTRAVENOUS
  Filled 2016-02-27 (×2): qty 2000

## 2016-02-27 NOTE — Progress Notes (Signed)
[place] PHYSICAL MEDICINE & REHABILITATION     PROGRESS NOTE  Subjective/Complaints:  Pt seen laying in bed this AM.  He does not follow commands.    ROS: Unable to assess due to cognition.   Objective: Vital Signs: Blood pressure 131/82, pulse 96, temperature 97.7 F (36.5 C), temperature source Oral, resp. rate 19, height 6\' 2"  (1.88 m), weight 72.5 kg (159 lb 13.3 oz), SpO2 96 %. Ir Sinus/fist Tube Chk-non Gi  Result Date: 02/25/2016 INDICATION: Status post percutaneous drainage of peritoneal abscess on 02/14/2016 following prior gastric surgery, laparoscopic gastrostomy and small bowel resection. EXAM: SINUS TRACT INJECTION / FISTULOGRAM CONTRAST:  10 mL Isovue-300 FLUOROSCOPY TIME:  1 minute.  22.9 mGy. MEDICATIONS: None. ANESTHESIA/SEDATION: None. COMPLICATIONS: None. TECHNIQUE: The pre-existing left lower quadrant peritoneal percutaneous drainage catheter was injected with contrast. Multiple fluoroscopic spot images were obtained. The drainage catheter was then flushed and reconnected to a suction bulb. PROCEDURE: See above. FINDINGS: There is initial filling of an irregular abscess cavity. With further distension of the cavity, there is opacification of adjacent small bowel near the level of a pre-existing gastrojejunal feeding catheter. Transit of contrast is seen more distally into small bowel within the right lower quadrant. IMPRESSION: The percutaneous drainage catheter injection is positive for fistula to adjacent small bowel. Electronically Signed   By: Irish Lack M.D.   On: 02/25/2016 16:34    Recent Labs  02/26/16 0405 02/27/16 0421  WBC 10.1 10.0  HGB 7.7* 8.3*  HCT 24.2* 25.4*  PLT 622* 599*    Recent Labs  02/26/16 0405 02/27/16 0421  NA 136 136  K 3.9 3.8  CL 105 106  GLUCOSE 118* 103*  BUN 33* 27*  CREATININE 0.61 0.68  CALCIUM 8.7* 8.9   CBG (last 3)   Recent Labs  02/26/16 1703 02/27/16 0017 02/27/16 0553  GLUCAP 89 129* 120*    Wt  Readings from Last 3 Encounters:  02/27/16 72.5 kg (159 lb 13.3 oz)  02/26/16 78.6 kg (173 lb 4.5 oz)  01/19/16 79.2 kg (174 lb 8 oz)    Physical Exam:  BP 131/82 (BP Location: Left Arm)   Pulse 96   Temp 97.7 F (36.5 C) (Oral)   Resp 19   Ht 6\' 2"  (1.88 m)   Wt 72.5 kg (159 lb 13.3 oz)   SpO2 96%   BMI 20.52 kg/m  Constitutional: No distress. Vital signs reviewed. Frail appearing.  HENT: Normocephalic and atraumatic.  Eyes: Keeps eyes closed. No discharge.  Cardiovascular: RRR. No JVD. Respiratory: No respiratory distress. Unlabored. GI: He exhibits no distension. BS+. +Eakins pouch LUQ drain  LLQ drain  Musculoskeletal: He exhibits edema (trace LE edema). He exhibits no tenderness.  Neurological: He is alert.  Unable to assess MMT and sensation due to lack of participation, but moving all extremities (previously noted: UE grossly 3-4/5 prox to distal. LE: 2+ to 3/5 HF, 3/5 KE and 4-/5 ADF/PF.) Skin: Skin is warm and dry.   Assessment/Plan: 1. Functional deficits secondary to debility and encephalopathy which require 3+ hours per day of interdisciplinary therapy in a comprehensive inpatient rehab setting. Physiatrist is providing close team supervision and 24 hour management of active medical problems listed below. Physiatrist and rehab team continue to assess barriers to discharge/monitor patient progress toward functional and medical goals.  Function:  Bathing Bathing position      Bathing parts      Bathing assist        Upper Body Dressing/Undressing  Upper body dressing                    Upper body assist        Lower Body Dressing/Undressing Lower body dressing                                  Lower body assist        Toileting Toileting   Toileting steps completed by patient: Adjust clothing prior to toileting      Toileting assist Assist level: More than reasonable time, Set up/obtain supplies   Transfers Chair/bed  transfer             Locomotion Ambulation           Wheelchair          Cognition Comprehension Comprehension assist level: Understands basic 75 - 89% of the time/ requires cueing 10 - 24% of the time  Expression Expression assist level: Expresses basic 75 - 89% of the time/requires cueing 10 - 24% of the time. Needs helper to occlude trach/needs to repeat words.  Social Interaction Social Interaction assist level: Interacts appropriately 25 - 49% of time - Needs frequent redirection.  Problem Solving Problem solving assist level: Solves basic 25 - 49% of the time - needs direction more than half the time to initiate, plan or complete simple activities  Memory Memory assist level: Recognizes or recalls 25 - 49% of the time/requires cueing 50 - 75% of the time    Medical Problem List and Plan: 1. Debility and encephalopathy secondary to small bowel obstruction and associated complications   Begin CIR 2. DVT Prophylaxis/Anticoagulation: Pharmaceutical: Lovenox  3. Pain Management: Changed PCA to IV morphine every 4 hours prn severe pain  4. Mood: team to provide ego support. LCSW to follow for evaluation and support.  5. Neuropsych: This patient is not fully capable of making decisions on his own behalf.  6. Skin/Wound Care: Continue air mattress overlay. Continue large Eakin's pouch to manage wound drainage. WOC for management. Encourage side lying to help manage MASD.  7. Malnutrition/Fluids/Electrolytes/Nutrition: continue TPN.  8. Abdominal abscess: Leucocytosis resolving. Continue rocephin  9. Anemia of chronic illness:   Hb 8.3 on 1/13  Cont to monitor 10. Electrolyte abnormality/Acute renal insufficiency: Managed by TNA  11. Encephalopathy: Changed haldol to IM prn for management of agitation/anxiety.  12. Hyperglycemia  Secondary to TPN, overall controlled 1/13   LOS (Days) 1 A FACE TO FACE EVALUATION WAS PERFORMED  Ikran Patman Karis Jubanil Karne Ozga 02/27/2016 9:55 AM

## 2016-02-27 NOTE — IPOC Note (Addendum)
Overall Plan of Care [facility]) Patient Details Name: [name] MRN: [id number] DOB: Sep 20, 1940  Admitting Diagnosis: Debility  Hospital Problems: Principal Problem:   Debility Active Problems:   Encephalopathy acute   Protein-calorie malnutrition, severe   Enterocutaneous fistula   Hyperglycemia   FTT (failure to thrive) in adult   Post-operative pain     Functional Problem List: Nursing Behavior, Bladder, Bowel, Medication Management, Endurance, Motor, Nutrition, Pain, Safety, Perception, Skin Integrity  PT Balance, Behavior, Endurance, Motor, Nutrition, Pain, Perception, Safety, Sensory, Skin Integrity  OT Balance, Behavior, Cognition, Endurance, Motor, Pain, Perception, Safety, Sensory, Skin Integrity, Vision  SLP    TR         Basic ADL's: OT Eating, Grooming, Bathing, Dressing, Toileting     Advanced  ADL's: OT       Transfers: PT Bed Mobility, Car, Bed to Chair, [employer], Civil Service fast streamer, Research scientist (life sciences): PT Psychologist, prison and probation services, Ambulation, Stairs     Additional Impairments: OT None  SLP        TR      Anticipated Outcomes Item Anticipated Outcome  Self Feeding NO goal at this time  Swallowing      Basic self-care  Min assist  Toileting  Min assist   Bathroom Transfers Min assist  Bowel/Bladder  Manage bowel with min assist   Transfers  Supervision with LRAD   Locomotion  Supervision assist with LRAD   Communication     Cognition     Pain  pain less than 4  Safety/Judgment  Remain safe while on Rehab   Therapy Plan: PT Intensity: Minimum of 1-2 x/day ,45 to 90 minutes PT Frequency: 5 out of 7 days PT Duration Estimated Length of Stay: 14-18 OT Intensity: Minimum of 1-2 x/day, 45 to 90 minutes OT Frequency: Total of 15 hours over 7 days of combined therapies OT Duration/Estimated Length of Stay: 18-21 days         Team Interventions: Nursing Interventions Patient/Family Education, Bladder Management, Bowel  Management, Disease Management/Prevention, Pain Management, Medication Management, Skin Care/Wound Management, Cognitive Remediation/Compensation, Discharge Planning, Psychosocial Support  PT interventions Ambulation/gait training, Warden/ranger, Community reintegration, Discharge planning, Disease management/prevention, Cognitive remediation/compensation, DME/adaptive equipment instruction, Functional electrical stimulation, Functional mobility training, Neuromuscular re-education, Pain management, Patient/family education, Psychosocial support, Skin care/wound management, Splinting/orthotics, Stair training, Therapeutic Activities, Therapeutic Exercise, UE/LE Strength taining/ROM, UE/LE Coordination activities, Visual/perceptual remediation/compensation, Wheelchair propulsion/positioning  OT Interventions Warden/ranger, Cognitive remediation/compensation, Discharge planning, Disease mangement/prevention, DME/adaptive equipment instruction, Functional mobility training, Pain management, Patient/family education, Psychosocial support, Self Care/advanced ADL retraining, Skin care/wound managment, Therapeutic Activities, Therapeutic Exercise, UE/LE Strength taining/ROM, UE/LE Coordination activities, Visual/perceptual remediation/compensation, Wheelchair propulsion/positioning  SLP Interventions    TR Interventions    SW/CM Interventions  Psychsocial Assessment, Discharge Planning, Pt/family Education    Team Discharge Planning: Destination: PT-Home ,OT- Home , SLP-  Projected Follow-up: PT-Home health PT, OT-  24 hour supervision/assistance, Home health OT, SLP-  Projected Equipment Needs: PT-Wheelchair (measurements), Wheelchair cushion (measurements), Rolling walker with 5" wheels, To be determined, OT- To be determined, SLP-  Equipment Details: PT- , OT-  Patient/family involved in discharge planning: PT- Patient, Family member/caregiver,  OT-Patient unable/family or  caregiver not available, SLP-   MD ELOS: 15-18 days Medical Rehab Prognosis:  Good Assessment: 76 year old male with history of CAD, chronic back pain, COPD, GERD with dysphonia, PUD due to ulcer and abdominal pain who underwent distal gastrectomy with vagotomy 09/2015 due to acquired stricture. Post  op course complicated by gastric ileus requiring jejunostomy, encephalopathy and FTT. He developed gastric leak due to jejunal disruption after pulling out his tube and required wash out with omentopexy 10/19, VDRF --tracheostomy, TPN as well as tube feeds and discharged to SNF for care and monitoring. He was readmitted on 01/19/16 due to abdominal pain with severe vomiting and unable to tolerate oral intake. He was found to have partial SBO treated conservatively without improvement and taken to OR on 12/23 for exp lap with lysis of adhesions, segmental SB resection and gastrojejunostomy. He developed drainage of enteric contents from midline incision on 12/29 and was found to have LLQ abscess with EC fistula requiring percutaneous drain placed by IVR on 12/31. Drainage from EC fistula from midline incision managed with use of Eakin pouch.  Intraabdominal wound cultures positive for moderate Klebsiella pneumoniae, moderate E coli and moderate bacteriodes thetaiotaomicron betalactamase positive. Respiratory status stable without significant secretions and patient decannulated on 01/10. Dr. Michaell CowingGross feels that fistula likely gastrojejunostomy and should heal with bowel rest --NPO and TNA to help with nutritional supplement. Surgery if fistula present at 6-12 months. LLQ drain injection 01/11 revealed fistula to SB--JP drain changed out to gravity bag. Antibiotics narrowed to Rocephin on 01/3 Mentation improving with confusion resolving and ongoing anxiety/agitation at nights. Therapy ongoing and patient showing improvement in activity tolerance and mobility. Will set goals for Supervision/Min A with therapies.   See  Team Conference Notes for weekly updates to the plan of care

## 2016-02-27 NOTE — Evaluation (Signed)
Occupational Therapy Assessment and Plan  Patient Details  Name: [name] MRN: [id number] Date of Birth: [birth year]  OT Diagnosis: abnormal posture, muscle weakness (generalized) and impaired cognition Rehab Potential: Rehab Potential (ACUTE ONLY): Fair ELOS: 18-21 days   Today's Date: 02/27/2016 OT Individual Time: 1300-1400 OT Individual Time Calculation (min): 60 min      Problem List: Patient Active Problem List   Diagnosis Date Noted  . Hyperglycemia   . FTT (failure to thrive) in adult   . Post-operative pain   . Tracheostomy status ([place])   . Chronic respiratory failure with hypoxia ([place])   . Enterocutaneous fistula 02/13/2016  . Anemia of chronic disease 01/31/2016  . Stage 2 skin ulcer of sacral region 01/31/2016  . Hypomagnesemia 01/21/2016  . Hypokalemia 01/21/2016  . SBO (small bowel obstruction) 01/20/2016  . Pressure injury of skin 01/20/2016  . Tracheostomy care ([place])   . Pleural effusion   . Gastroesophageal reflux disease 01/19/2016  . Insomnia 01/19/2016  . Aspiration pneumonia ([place]) 01/17/2016  . On total parenteral nutrition (TPN) 01/17/2016  . Metabolic acidosis 02/22/3233  . Skin ulcer of abdominal wall, limited to breakdown of skin ([place]) 01/17/2016  . Dysphagia 01/17/2016  . Debility 01/17/2016  . 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 Vitamin B12 deficiency 01/02/2016  . BPH (benign prostatic hyperplasia)   . Posterior vitreous detachment   . Salzmann's nodular dystrophy of left eye   . Hyperopia of both eyes with astigmatism and presbyopia   . Central pterygium of right eye   . Pressure ulcer of buttock, right, unstageable ([place]) 12/30/2015  . Hypernatremia 12/28/2015  . Tracheostomy in place [facility]) 12/25/2015  . Tobacco abuse 12/09/2015  . Ischemic right index finger at tip 12/09/2015  . Gastritis 12/09/2015  . Gastric tube present (LUQ) 12/09/2015  . Acute respiratory failure with hypoxemia ([place])   . Protein-calorie  malnutrition, severe 12/04/2015  . Encephalopathy acute 12/03/2015  . Pyloric stricture 11/24/2015  . Anxiety state 09/28/2015  . Hyperlipidemia   . 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 Coronary atherosclerosis of native coronary artery 04/24/2013  . Essential hypertension, benign 04/24/2013  . Other and unspecified hyperlipidemia 04/24/2013  . Central pterygium 10/30/2012  . 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 Cataract, nuclear sclerotic senile 10/30/2012    Past Medical History:  Past Medical History:  Diagnosis Date  . Bilateral dry eyes   . Bile peritonitis due to gastric tube dislodgement 12/03/2015  . BPH (benign prostatic hyperplasia)   . Central pterygium of right eye   . Coronary artery disease 1997, 2009   bare mental stent right coronart artery, occlusive followed by Dr. Pernell Dupre in [place]  . Degenerative disc disease    LOWER BACK  . Dysphonia   . ED (erectile dysfunction)   . Gastric outlet obstruction 11/24/2015  . Gastroesophageal reflux disease   . Hyperlipidemia   . Hyperopia of both eyes with astigmatism and presbyopia   . Hypertension   . Nuclear senile cataract    Dr. [facility] in [place]  . Peptic ulcer   . Pneumatosis of intestines 12/11/2015  . Pneumonia    HISTORY OF IN CHILDHOOD  . Posterior vitreous detachment 10/30/2012  . Salzmann's nodular dystrophy of left eye   . Urinary hesitancy   . Wears glasses    Past Surgical History:  Past Surgical History:  Procedure Laterality Date  . CARDIAC CATHETERIZATION    .  CHOLECYSTECTOMY    . ESOPHAGOGASTRODUODENOSCOPY N/A 12/01/2015   Procedure: ESOPHAGOGASTRODUODENOSCOPY (EGD) BALLOON DILATION OF DUODENAL STRICTURE;  Surgeon: Michael Boston, MD;  Location: [facility];  Service: General;  Laterality: N/A;  . ESOPHAGOGASTRODUODENOSCOPY N/A 12/14/2015   Procedure: ESOPHAGOGASTRODUODENOSCOPY (EGD);   Surgeon: Clarene Essex, MD;  Location: [facility];  Service: Endoscopy;  Laterality: N/A;  Patient has tracheostomy  . ESOPHAGOGASTRODUODENOSCOPY (EGD) WITH PROPOFOL N/A 07/01/2015   Procedure: ESOPHAGOGASTRODUODENOSCOPY (EGD) WITH PROPOFOL;  Surgeon: Arta Silence, MD;  Location: [facility];  Service: Endoscopy;  Laterality: N/A;  . ESOPHAGOGASTRODUODENOSCOPY (EGD) WITH PROPOFOL N/A 11/23/2015   Procedure: ESOPHAGOGASTRODUODENOSCOPY (EGD) WITH PROPOFOL;  Surgeon: Arta Silence, MD;  Location: [facility] ENDOSCOPY;  Service: Endoscopy;  Laterality: N/A;  may need to intubate  . EYE SURGERY     growth on right eye removed   . IR GENERIC HISTORICAL  12/13/2015   IR REPLC DUODEN/JEJUNO TUBE PERCUT W/FLUORO 12/13/2015 Marybelle Killings, MD [facility]INTER[facility] RAD  . IR GENERIC HISTORICAL  02/03/2016   IR [place] GASTRO/COLONIC TUBE PERCUT W/FLUORO 02/03/2016 Greggory Keen, MD [facility]  . IR GENERIC HISTORICAL  02/04/2016   IR GASTR TUBE CONVERT GASTR-JEJ PER W/FL MOD SED 02/04/2016 Jacqulynn Cadet, MD [facility]  . IR GENERIC HISTORICAL  02/25/2016   IR SINUS/FIST TUBE CHK-NON GI 02/25/2016 Aletta Edouard, MD [facility]  . LAPAROSCOPIC GASTROSTOMY N/A 12/01/2015   Procedure: LAPAROSCOPIC PLACEMENT OF FEEDING JEJUNOSTOMY AND GASTROSTOMY TUBE;  Surgeon: Michael Boston, MD;  Location: [facility];  Service: General;  Laterality: N/A;  . LAPAROSCOPY N/A 12/03/2015   Procedure: LAPAROSCOPY DIAGNOSTIC, OMENTOPEXY, JEJUNOSTOMY, [place] OUT;  Surgeon: Michael Boston, MD;  Location: [facility];  Service: General;  Laterality: N/A;  . LAPAROTOMY N/A 02/06/2016   Procedure: EXPLORATORY LAPAROTOMY, LYSIS OF ADHESIONS, SEGMENTAL SMALL BOWEL RESECTION, GASTROJEJUNOSTOMY;  Surgeon: Autumn Messing III, MD;  Location: [facility];  Service: General;  Laterality: N/A;  . STENT TO HEART     2 1997 AND 1 IN 1996  . TONSILLECTOMY    . XI ROBOTIC VAGOTOMY AND ANTRECTOMY N/A 09/25/2015   Procedure: XI ROBOTIC ANTERIOR AND POSTERIOR VAGOTOMY, BILROTH I   ANASTOMOSIS DOR FUNDIPLICATION, OMENTOPEXY  UPPER ENDOSCOPY;  Surgeon: Michael Boston, MD;  Location: [facility];  Service: General;  Laterality: N/A;    Assessment & Plan Clinical Impression: Patient is a 76 y.o. year old male with history of CAD, chronic back pain, COPD, GERD with dysphonia, PUD due to ulcer and abdominal pain who underwent distal gastrectomy with vagotomy 09/2015 due to acquired stricture. Post op course complicated by gastric ileus requiring jejunostomy, encephalopathy and FTT. He developed gastric leak due to jejunal disruption after pulling out his tube and required wash out with omentopexy 10/19, VDRF --tracheostomy, TPN as well as tube feeds and discharged to SNF for care and monitoring. He was readmitted on 01/19/16 due to abdominal pain with severe vomiting and unable to tolerate oral intake. He was found to have partial SBO treated conservatively without improvement and taken to OR on 12/23 for exp lap with lysis of adhesions, segmental SB resection and gastrojejunostomy. He developed drainage of enteric contents from midline incision on 12/29 and was found to have LLQ abscess with EC fistula requiring percutaneous drain placed by IVR on 12/31. Drainage from EC fistula from midline incision managed with use of Eakin pouch.  Intraabdominal wound cultures positive for moderate Klebsiella pneumoniae, moderate E coli and moderate bacteriodes thetaiotaomicron betalactamase positive. Respiratory status stable without significant secretions and patient decannulated on 01/10. Dr. Johney Maine  feels that fistula likely gastrojejunostomy and should heal with bowel rest --NPO and TNA to help with nutritional supplement. Surgery if fistula present at 6-12 months. LLQ drain injection 01/11 revealed fistula to SB--JP drain changed out to gravity bag. Antibiotics narrowed to Rocephin on 01/3 Mentation improving with confusion resolving and ongoing anxiety/agitation at nights. Therapy ongoing and patient showing  improvement in activity tolerance and mobility. CIR recommended by rehab team.  Patient transferred to CIR on 02/26/2016 .    Patient currently requires total with basic self-care skills secondary to muscle weakness, decreased cardiorespiratoy endurance, decreased initiation, decreased awareness, decreased problem solving, decreased safety awareness and delayed processing and decreased sitting balance, decreased standing balance, decreased postural control and decreased balance strategies.  Prior to hospitalization, patient could complete ADLs with modified independent .  Patient will benefit from skilled intervention to decrease level of assist with basic self-care skills prior to discharge home with care partner.  Anticipate patient will require 24 hour supervision and minimal physical assistance and follow up home health.  OT - End of Session Activity Tolerance: Tolerates 10 - 20 min activity with multiple rests Endurance Deficit: Yes Endurance Deficit Description: Pt with decreased arousal with eyes rolling back even when upright engaging in bathing at sink. OT Assessment Rehab Potential (ACUTE ONLY): Fair Barriers to Discharge:  (Unsure, no family on eval) OT Patient demonstrates impairments in the following area(s): Balance;Behavior;Cognition;Endurance;Motor;Pain;Perception;Safety;Sensory;Skin Integrity;Vision OT Basic ADL's Functional Problem(s): Eating;Grooming;Bathing;Dressing;Toileting OT Transfers Functional Problem(s): Toilet;Tub/Shower OT Additional Impairment(s): None OT Plan OT Intensity: Minimum of 1-2 x/day, 45 to 90 minutes OT Frequency: Total of 15 hours over 7 days of combined therapies OT Duration/Estimated Length of Stay: 18-21 days OT Treatment/Interventions: Balance/vestibular training;Cognitive remediation/compensation;Discharge planning;Disease mangement/prevention;DME/adaptive equipment instruction;Functional mobility training;Pain management;Patient/family  education;Psychosocial support;Self Care/advanced ADL retraining;Skin care/wound managment;Therapeutic Activities;Therapeutic Exercise;UE/LE Strength taining/ROM;UE/LE Coordination activities;Visual/perceptual remediation/compensation;Wheelchair propulsion/positioning OT Self Feeding Anticipated Outcome(s): NO goal at this time OT Basic Self-Care Anticipated Outcome(s): Min assist OT Toileting Anticipated Outcome(s): Min assist OT Bathroom Transfers Anticipated Outcome(s): Min assist OT Recommendation Recommendations for Other Services: Speech consult Patient destination: Home Follow Up Recommendations: 24 hour supervision/assistance;Home health OT Equipment Recommended: To be determined   Skilled Therapeutic Intervention OT eval completed addressing rehab process, OT purpose, POC, ELOS, and goals.  Limited ADL assessment secondary to decreased arousal and pt participation.  Pt initially alert and responsive to therapist.  Engaged in sit > stand with max assist with manual facilitation for anterior weight shift secondary to posterior lean in standing and during transitional movements.  Ambulated 5 steps with max assist then pt leaning backwards requiring seated break.  Stand pivot with RW to w/c with max assist and shuffling gait.  Attempted to engage in bathing at sink with pt able to initiate and follow one step commands, then eyes rolling back in head and pt posturing backwards with difficulty opening eyes to stimulus.  Notified RN who assisted with assessing vitals, BP WNL and O2 sats stable, HR elevated to 117.  Stand pivot transfer +2 back to bed, once seated at EOB pt opened eyes and reports that he had felt dizzy and felt like he "passed out" but was still somewhat engaged.  Assisted into bed and set bed into chair position for increased support while maintaining mostly upright posture.  OT Evaluation Precautions/Restrictions  Precautions Precautions: Fall Precaution Comments: recent LAP  with mod ABD drainage, 1 drains, 1 bulb    , PCA, trach capped Pain Pain Assessment Pain Assessment: (P) Faces Faces Pain  Scale: (P) Hurts a little bit Pain Type: (P) Surgical pain Home Living/Prior Functioning Home Living Family/patient expects to be discharged to:: Private residence Available Help at Discharge: Family Type of Home: House Home Access: Stairs to enter Technical brewer of Steps: 3 Entrance Stairs-Rails: Left, Right Home Layout: One level Bathroom Shower/Tub: Walk-in shower (has a shower seat and grab bars) Bathroom Toilet: Handicapped height Additional Comments: prior to last admission in October, pt discharged to SNF last admission and admitted from SNF  Lives With: Spouse Prior Function Level of Independence: Requires assistive device for independence  Able to Take Stairs?: Yes Vocation: Full time employment ADL  See Function Navigator Vision/Perception  Vision- History Baseline Vision/History: Wears glasses Wears Glasses: Reading only Patient Visual Report: No change from baseline Vision- Assessment Vision Assessment?: Vision impaired- to be further tested in functional context Additional Comments: difficult to assess secondary to decreased arousal and difficulty following directions  Cognition Overall Cognitive Status: No family/caregiver present to determine baseline cognitive functioning Arousal/Alertness: Lethargic Orientation Level: Person (pt reports he is in [place] and decreased awareness of situation) Year:  (Unable to assess secondary to decreased arousal, attention, and difficulty following directions) Memory: Impaired Memory Impairment: Decreased short term memory;Retrieval deficit Attention: Focused Focused Attention: Impaired Focused Attention Impairment: Functional basic Sustained Attention: Impaired Sustained Attention Impairment: Functional basic Awareness: Impaired Awareness Impairment: Intellectual impairment;Emergent  impairment Problem Solving: Impaired Behaviors: Restless Safety/Judgment: Impaired Sensation Sensation Light Touch: Impaired by gross assessment Proprioception: Impaired by gross assessment Additional Comments: poor foot placement with gait and transfers  Coordination Gross Motor Movements are Fluid and Coordinated: No Fine Motor Movements are Fluid and Coordinated: No Coordination and Movement Description: poor coordination of BLE with functional movement.  Extremity/Trunk Assessment RUE Assessment RUE Assessment: Exceptions to WFL (ROM WFL, decreased strength grossly 3-4/5 proximal to distal.  Difficult to assess secondary to decreased attention) LUE Assessment LUE Assessment: Exceptions to WFL (ROM WFL, decreased strength grossly 3-4/5 proximal to distal.  Difficult to assess secondary to decreased attention)   See Function Navigator for Current Functional Status.   Refer to Care Plan for Long Term Goals  Recommendations for other services: None    Discharge Criteria: Patient will be discharged from OT if patient refuses treatment 3 consecutive times without medical reason, if treatment goals not met, if there is a change in medical status, if patient makes no progress towards goals or if patient is discharged from hospital.  The above assessment, treatment plan, treatment alternatives and goals were discussed and mutually agreed upon: No family available/patient unable  Aquia Harbour, [facility] 02/27/2016, 2:01 PM

## 2016-02-27 NOTE — Evaluation (Signed)
Physical Therapy Assessment and Plan  Patient Details  Name: [name] MRN: [id number] Date of Birth: 1940-09-13  PT Diagnosis: Abnormality of gait, Coordination disorder, Difficulty walking, Impaired cognition and Muscle weakness Rehab Potential: Good ELOS: 14-18   Today's Date: 02/27/2016 PT Individual Time: 0800-0930 PT Individual Time Calculation (min): 90 min     Problem List: Patient Active Problem List   Diagnosis Date Noted  . Hyperglycemia   . FTT (failure to thrive) in adult   . Post-operative pain   . Tracheostomy status ([place])   . Chronic respiratory failure with hypoxia ([place])   . Enterocutaneous fistula 02/13/2016  . Anemia of chronic disease 01/31/2016  . Stage 2 skin ulcer of sacral region 01/31/2016  . Hypomagnesemia 01/21/2016  . Hypokalemia 01/21/2016  . SBO (small bowel obstruction) 01/20/2016  . Pressure injury of skin 01/20/2016  . Tracheostomy care ([place])   . Pleural effusion   . Gastroesophageal reflux disease 01/19/2016  . Insomnia 01/19/2016  . Aspiration pneumonia ([place]) 01/17/2016  . On total parenteral nutrition (TPN) 01/17/2016  . Metabolic acidosis 47/82/9562  . Skin ulcer of abdominal wall, limited to breakdown of skin ([place]) 01/17/2016  . Dysphagia 01/17/2016  . Debility 01/17/2016  . 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 Vitamin B12 deficiency 01/02/2016  . BPH (benign prostatic hyperplasia)   . Posterior vitreous detachment   . Salzmann's nodular dystrophy of left eye   . Hyperopia of both eyes with astigmatism and presbyopia   . Central pterygium of right eye   . Pressure ulcer of buttock, right, unstageable ([place]) 12/30/2015  . Hypernatremia 12/28/2015  . Tracheostomy in place [facility]) 12/25/2015  . Tobacco abuse 12/09/2015  . Ischemic right index finger at tip 12/09/2015  . Gastritis 12/09/2015  . Gastric tube present (LUQ) 12/09/2015  . Acute respiratory failure with hypoxemia ([place])   . Protein-calorie malnutrition,  severe 12/04/2015  . Encephalopathy acute 12/03/2015  . Pyloric stricture 11/24/2015  . Anxiety state 09/28/2015  . Hyperlipidemia   . 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 Coronary atherosclerosis of native coronary artery 04/24/2013  . Essential hypertension, benign 04/24/2013  . Other and unspecified hyperlipidemia 04/24/2013  . Central pterygium 10/30/2012  . 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 Cataract, nuclear sclerotic senile 10/30/2012    Past Medical History:  Past Medical History:  Diagnosis Date  . Bilateral dry eyes   . Bile peritonitis due to gastric tube dislodgement 12/03/2015  . BPH (benign prostatic hyperplasia)   . Central pterygium of right eye   . Coronary artery disease 1997, 2009   bare mental stent right coronart artery, occlusive followed by Dr. Pernell Dupre in [place]  . Degenerative disc disease    LOWER BACK  . Dysphonia   . ED (erectile dysfunction)   . Gastric outlet obstruction 11/24/2015  . Gastroesophageal reflux disease   . Hyperlipidemia   . Hyperopia of both eyes with astigmatism and presbyopia   . Hypertension   . Nuclear senile cataract    Dr. [facility] in [place]  . Peptic ulcer   . Pneumatosis of intestines 12/11/2015  . Pneumonia    HISTORY OF IN CHILDHOOD  . Posterior vitreous detachment 10/30/2012  . Salzmann's nodular dystrophy of left eye   . Urinary hesitancy   . Wears glasses    Past Surgical History:  Past Surgical History:  Procedure Laterality Date  . CARDIAC CATHETERIZATION    .  CHOLECYSTECTOMY    . ESOPHAGOGASTRODUODENOSCOPY N/A 12/01/2015   Procedure: ESOPHAGOGASTRODUODENOSCOPY (EGD) BALLOON DILATION OF DUODENAL STRICTURE;  Surgeon: Michael Boston, MD;  Location: [facility];  Service: General;  Laterality: N/A;  . ESOPHAGOGASTRODUODENOSCOPY N/A 12/14/2015   Procedure: ESOPHAGOGASTRODUODENOSCOPY (EGD);  Surgeon: Clarene Essex, MD;  Location: [facility];  Service: Endoscopy;  Laterality: N/A;  Patient has tracheostomy  . ESOPHAGOGASTRODUODENOSCOPY (EGD) WITH PROPOFOL N/A 07/01/2015   Procedure: ESOPHAGOGASTRODUODENOSCOPY (EGD) WITH PROPOFOL;  Surgeon: Arta Silence, MD;  Location: [facility];  Service: Endoscopy;  Laterality: N/A;  . ESOPHAGOGASTRODUODENOSCOPY (EGD) WITH PROPOFOL N/A 11/23/2015   Procedure: ESOPHAGOGASTRODUODENOSCOPY (EGD) WITH PROPOFOL;  Surgeon: Arta Silence, MD;  Location: [facility] ENDOSCOPY;  Service: Endoscopy;  Laterality: N/A;  may need to intubate  . EYE SURGERY     growth on right eye removed   . IR GENERIC HISTORICAL  12/13/2015   IR REPLC DUODEN/JEJUNO TUBE PERCUT W/FLUORO 12/13/2015 Marybelle Killings, MD [facility]INTER[facility] RAD  . IR GENERIC HISTORICAL  02/03/2016   IR [place] GASTRO/COLONIC TUBE PERCUT W/FLUORO 02/03/2016 Greggory Keen, MD [facility]  . IR GENERIC HISTORICAL  02/04/2016   IR GASTR TUBE CONVERT GASTR-JEJ PER W/FL MOD SED 02/04/2016 Jacqulynn Cadet, MD [facility]  . IR GENERIC HISTORICAL  02/25/2016   IR SINUS/FIST TUBE CHK-NON GI 02/25/2016 Aletta Edouard, MD [facility]  . LAPAROSCOPIC GASTROSTOMY N/A 12/01/2015   Procedure: LAPAROSCOPIC PLACEMENT OF FEEDING JEJUNOSTOMY AND GASTROSTOMY TUBE;  Surgeon: Michael Boston, MD;  Location[facility];  Service: General;  Laterality: N/A;  . LAPAROSCOPY N/A 12/03/2015   Procedure: LAPAROSCOPY DIAGNOSTIC, OMENTOPEXY, JEJUNOSTOMY, [place] OUT;  Surgeon: Michael Boston, MD;  [facility];  Service: General;  Laterality: N/A;  . LAPAROTOMY N/A 02/06/2016   Procedure: EXPLORATORY LAPAROTOMY, LYSIS OF ADHESIONS, SEGMENTAL SMALL BOWEL RESECTION, GASTROJEJUNOSTOMY;  Surgeon: Autumn Messing III, MD;  Location: [facility];  Service: General;  Laterality: N/A;  . STENT TO HEART     2 1997 AND 1 IN 1996  . TONSILLECTOMY    . XI ROBOTIC VAGOTOMY AND ANTRECTOMY N/A 09/25/2015   Procedure: XI ROBOTIC ANTERIOR AND POSTERIOR VAGOTOMY, BILROTH I  ANASTOMOSIS DOR  FUNDIPLICATION, OMENTOPEXY  UPPER ENDOSCOPY;  Surgeon: Michael Boston, MD;  [facility];  Service: General;  Laterality: N/A;    Assessment & Plan Clinical Impression: Patient is a 75 year old male with history of CAD, chronic back pain, COPD, GERD with dysphonia, PUD due to ulcer and abdominal pain who underwent distal gastrectomy with vagotomy 09/2015 due to acquired stricture. Post op course complicated by gastric ileus requiring jejunostomy, encephalopathy and FTT. He developed gastric leak due to jejunal disruption after pulling out his tube and required wash out with omentopexy 10/19, VDRF --tracheostomy, TPN as well as tube feeds and discharged to SNF for care and monitoring. He was readmitted on 01/19/16 due to abdominal pain with severe vomiting and unable to tolerate oral intake. He was found to have partial SBO treated conservatively without improvement and taken to OR on 12/23 for exp lap with lysis of adhesions, segmental SB resection and gastrojejunostomy. He developed drainage of enteric contents from midline incision on 12/29 and was found to have LLQ abscess with EC fistula requiring percutaneous drain placed by IVR on 12/31. Drainage from EC fistula from midline incision managed with use of [place] pouch.  Intraabdominal wound cultures positive for moderate Klebsiella pneumoniae, moderate E coli and moderate bacteriodes thetaiotaomicron betalactamase positive. Respiratory status stable without significant secretions and patient decannulated on 01/10. Dr. Johney Maine feels  that fistula likely gastrojejunostomy and should heal with bowel rest --NPO and TNA to help with nutritional supplement. Surgery if fistula present at 6-12 months. LLQ drain injection 01/11 revealed fistula to SB--JP drain changed out to gravity bag. Antibiotics narrowed to Rocephin on 01/3 Mentation improving with confusion resolving and ongoing anxiety/agitation at nights.  Patient transferred to CIR on 02/26/2016 .   Patient  currently requires max with mobility secondary to muscle weakness, decreased cardiorespiratoy endurance, decreased attention, decreased awareness, decreased problem solving, decreased safety awareness and delayed processing and decreased sitting balance, decreased standing balance, decreased postural control and decreased balance strategies.  Prior to hospitalization, patient was modified independent  with mobility and lived with   in a House home.  Home access is 3Stairs to enter.  Patient will benefit from skilled PT intervention to maximize safe functional mobility, minimize fall risk and decrease caregiver burden for planned discharge home with 24 hour supervision.  Anticipate patient will benefit from follow up [place] at discharge.  PT - End of Session Activity Tolerance: Tolerates 30+ min activity with multiple rests Endurance Deficit: Yes PT Assessment Rehab Potential (ACUTE/IP ONLY): Good Barriers to Discharge: Inaccessible home environment PT Patient demonstrates impairments in the following area(s): Balance;Behavior;Endurance;Motor;Nutrition;Pain;Perception;Safety;Sensory;Skin Integrity PT Transfers Functional Problem(s): Bed Mobility;Car;Bed to Chair;Furniture;Floor PT Locomotion Functional Problem(s): Wheelchair Mobility;Ambulation;Stairs PT Plan PT Intensity: Minimum of 1-2 x/day ,45 to 90 minutes PT Frequency: 5 out of 7 days PT Duration Estimated Length of Stay: 14-18 PT Treatment/Interventions: Ambulation/gait training;Balance/vestibular training;Community reintegration;Discharge planning;Disease management/prevention;Cognitive remediation/compensation;DME/adaptive equipment instruction;Functional electrical stimulation;Functional mobility training;Neuromuscular re-education;Pain management;Patient/family education;Psychosocial support;Skin care/wound management;Splinting/orthotics;Stair training;Therapeutic Activities;Therapeutic Exercise;UE/LE Strength taining/ROM;UE/LE Coordination  activities;Visual/perceptual remediation/compensation;Wheelchair propulsion/positioning PT Transfers Anticipated Outcome(s): Supervision with LRAD  PT Locomotion Anticipated Outcome(s): Gait with Min assist with LRAD, Supervision WC level.   PT Recommendation Follow Up Recommendations: Home health PT Patient destination: Home Equipment Recommended: Wheelchair (measurements);Wheelchair cushion (measurements);Rolling walker with 5" wheels;To be determined      Skilled Therapeutic Intervention PT instructed patient in Evaluation and initiated treatment intervention. Patient instructed in bed mobility, basic transfers, car transfers, gait, WC mobility, as listed below. Pt required max assist with transfers without AD and mod-max assist with RW and heavy cues for anterior weight shift. Mod assist x 12 ft for gait with RW. PT also educated patient and Daughter in [place] as well as rehab potential, and discharge recommendations. Patient left supine in bed with all needs met.    PT Evaluation Precautions/Restrictions Precautions Precautions: Fall Precaution Comments: recent LAP with mod ABD drainage, 1 drains, 1 bulb    , PCA, trach capped Restrictions Weight Bearing Restrictions: No General   Vital Signs  Pain Pain Assessment Pain Assessment: Faces Faces Pain Scale: Hurts a little bit Pain Type: Surgical pain Pain Location: Abdomen Patients Stated Pain Goal: 2 Home Living/Prior Functioning Home Living Available Help at Discharge: Family Type of Home: House Home Access: Stairs to enter Technical brewer of Steps: 3 Entrance Stairs-Rails: Left;Right Home Layout: One level Bathroom Shower/Tub: Multimedia programmer: Handicapped height Additional Comments: prior to last admission in October, pt discharged to SNF last admission and admitted from SNF Prior Function Level of Independence: Requires assistive device for independence  Able to Take Stairs?: Yes Vocation: Full  time employment  Cognition Orientation Level: Oriented to person;Oriented to place;Disoriented to time;Disoriented to situation Attention: Sustained;Focused Focused Attention: Impaired Focused Attention Impairment: Functional basic Sustained Attention: Impaired Sustained Attention Impairment: Functional basic Memory: Impaired Memory Impairment: Decreased short term memory;Retrieval deficit Awareness: Impaired  Awareness Impairment: Emergent impairment;Intellectual impairment Problem Solving: Impaired Behaviors: Restless Safety/Judgment: Impaired Sensation Sensation Light Touch: Impaired by gross assessment Proprioception: Impaired by gross assessment Additional Comments: poor foot placement with gait and transfers  Coordination Gross Motor Movements are Fluid and Coordinated: No Coordination and Movement Description: poor coordination of BLE with functional movement.  Motor  Motor Motor: Other (comment) Motor - Skilled Clinical Observations: Generalized weakness  Mobility Bed Mobility Bed Mobility: Rolling Right;Rolling Left;Supine to Sit;Sit to Supine Rolling Right: 4: Min assist Rolling Right Details: Verbal cues for technique;Verbal cues for precautions/safety;Manual facilitation for weight shifting Rolling Left: 4: Min assist Rolling Left Details: Verbal cues for technique;Verbal cues for precautions/safety;Tactile cues for placement Supine to Sit: 3: Mod assist Supine to Sit Details: Verbal cues for sequencing;Verbal cues for technique;Verbal cues for precautions/safety;Verbal cues for safe use of DME/AE Transfers Transfers: Yes Sit to Stand: 2: Max assist (without AD) Sit to Stand Details: Verbal cues for precautions/safety;Verbal cues for technique;Verbal cues for gait pattern;Verbal cues for safe use of DME/AE;Manual facilitation for weight shifting Sit to Stand Details (indicate cue type and reason): without AD. constant posterior Lean Stand Pivot Transfers: 2: Max  assist (without AD) Stand Pivot Transfer Details: Verbal cues for sequencing;Verbal cues for technique;Verbal cues for gait pattern;Visual cues/gestures for sequencing;Verbal cues for precautions/safety;Manual facilitation for weight shifting Locomotion  Ambulation Ambulation: Yes Ambulation/Gait Assistance: 3: Mod assist Ambulation Distance (Feet): 12 Feet Assistive device: Rolling walker Ambulation/Gait Assistance Details: Verbal cues for sequencing;Verbal cues for technique;Verbal cues for precautions/safety;Verbal cues for gait pattern;Manual facilitation for weight shifting;Verbal cues for safe use of DME/AE;Tactile cues for placement Gait Gait: Yes Gait Pattern: Impaired Gait Pattern: Step-through pattern (poor heel contact and constant posterior lean) Stairs / Additional Locomotion Stairs: Yes Stairs Assistance: 3: Mod assist Stairs Assistance Details: Verbal cues for technique;Verbal cues for sequencing;Verbal cues for gait pattern;Manual facilitation for weight shifting;Verbal cues for precautions/safety;Verbal cues for safe use of DME/AE;Tactile cues for placement;Tactile cues for weight shifting Stair Management Technique: Two rails Number of Stairs: 2 Height of Stairs: 3 Wheelchair Mobility Wheelchair Mobility: Yes Wheelchair Assistance: 4: Min guard (for IV pole and WC parts management) Wheelchair Propulsion: Both upper extremities Wheelchair Parts Management: Needs assistance Distance: 129f   Trunk/Postural Assessment  Cervical Assessment Cervical Assessment: Exceptions to [facility](Forward head) Thoracic Assessment Thoracic Assessment: Exceptions to [facility](decreased mobility with rotation and flexion. slighty kyphotic ) Lumbar Assessment Lumbar Assessment: Exceptions to [facility](Posterior pelvic tilt. poor mobility) Postural Control Postural Control: Deficits on evaluation Righting Reactions: deficits - constant posteiror LOB.  Protective Responses: Deficits   Balance Balance Balance Assessed: Yes Static Sitting Balance Static Sitting - Balance Support: No upper extremity supported Static Sitting - Level of Assistance: 4: Min assist Static Sitting - Comment/# of Minutes: Posteiror LOB Dynamic Sitting Balance Dynamic Sitting - Balance Support: No upper extremity supported Dynamic Sitting - Level of Assistance: 4: Min assist Dynamic Sitting - Balance Activities: Reaching for objects Sitting balance - Comments: lateral R and Posterior LOB Static Standing Balance Static Standing - Balance Support: Bilateral upper extremity supported Static Standing - Level of Assistance: 3: Mod assist Static Standing - Comment/# of Minutes: Constant posterior LOB Dynamic Standing Balance Dynamic Standing - Balance Support: Bilateral upper extremity supported Dynamic Standing - Level of Assistance: 2: Max assist Dynamic Standing - Comments: with functional movement Extremity Assessment      RLE Assessment RLE Assessment: Exceptions to [facility] AROM (degrees) RLE Overall AROM Comments: lacking ~5 degrees DF from  neutral RLE Strength RLE Overall Strength Comments: 4/5 overall proximal to distal  LLE Assessment LLE Assessment: Exceptions to WFL LLE AROM (degrees) LLE Overall AROM Comments: lacking ~5 degrees DF from neurtal  LLE PROM (degrees) LLE Overall PROM Comments: unable to come to [place] DF in standing  LLE Strength LLE Overall Strength Comments: 4/5 overall proximal to distal    See Function Navigator for Current Functional Status.   Refer to Care Plan for Long Term Goals  Recommendations for other services: None   Discharge Criteria: Patient will be discharged from PT if patient refuses treatment 3 consecutive times without medical reason, if treatment goals not met, if there is a change in medical status, if patient makes no progress towards goals or if patient is discharged from hospital.  The above assessment, treatment plan, treatment  alternatives and goals were discussed and mutually agreed upon: by patient and by family  Lorie Phenix 02/27/2016, 12:50 PM

## 2016-02-27 NOTE — Progress Notes (Signed)
Initial Nutrition Assessment  DOCUMENTATION CODES:  Non-severe (moderate) malnutrition in context of chronic illness  INTERVENTION:  TPN per pharmacy.  Patient may need slightly more Kcal, however, acknowledge limitations with clinimix  NUTRITION DIAGNOSIS:  Inadequate oral intake related to altered GI function (Enteric fistula) as evidenced by NPO status TPN dependant)  GOAL:  Patient will meet greater than or equal to 90% of their needs  MONITOR:  Diet advancement, Labs, Weight trends  REASON FOR ASSESSMENT:  Malnutrition Screening Tool    ASSESSMENT:  76 y/o mle w/ complex PMHx including COPD, GERD, CAD, PUD. Underwent gastrectomy in August due to stricture w/ post op complications requiring jejunostomy. He was most recently admitted for SBO and enteric fistula. He is on bowel rest, TPN dependant.   Pt today reports that he still has feeling of hunger. Encouraged that this should dissipate with time. He has ice chips ordered. He denies any N/V. He says he is constipated, but may be more due to lack of PO intake. He should still have an infrequent BM, mucous consistency.   Current TPN providing 100 gm protein and 1920 kcal/day.   By estimated requirements, he is receiving sufficient protein. Do believe he is slightly under receiving calories, target is ~30 kcal/kg or 2200 kcals. However, understand limitations with clinimix.   NFPE: mod muscle/fat wasting.   Medications: IV abx, TPN cyclic  Labs: Albumin: 1.4,    Recent Labs Lab 02/22/16 0451 02/24/16 0519 02/25/16 0351 02/26/16 0405 02/27/16 0421  NA 135 135  --  136 136  K 4.1 4.1  --  3.9 3.8  CL 105 105  --  105 106  CO2 23 25  --  25 24  BUN 31* 37*  --  33* 27*  CREATININE 0.78 0.69  --  0.61 0.68  CALCIUM 8.6* 8.7*  --  8.7* 8.9  MG 1.8  --  1.7  --  1.7  PHOS 4.2  --  4.1  --   --   GLUCOSE 107* 124*  --  118* 103*    Diet Order:  Diet NPO time specified Except for: Ice Chips TPN (CLINIMIX-E)  Adult  Skin: Multiple healing surgical wounds  Last BM:  1/13  Height:  Ht Readings from Last 1 Encounters:  02/27/16 6\' 2"  (1.88 m)   Weight:  Wt Readings from Last 1 Encounters:  02/27/16 159 lb 13.3 oz (72.5 kg)   Wt Readings from Last 10 Encounters:  02/27/16 159 lb 13.3 oz (72.5 kg)  02/26/16 173 lb 4.5 oz (78.6 kg)  01/19/16 174 lb 8 oz (79.2 kg)  01/15/16 166 lb (75.3 kg)  01/14/16 166 lb (75.3 kg)  01/13/16 166 lb (75.3 kg)  01/12/16 166 lb (75.3 kg)  01/11/16 166 lb (75.3 kg)  12/31/15 177 lb (80.3 kg)  12/30/15 168 lb 3.4 oz (76.3 kg)    Ideal Body Weight:  86.36 kg  BMI:  Body mass index is 20.52 kg/m.  Estimated Nutritional Needs:  Kcal:  2100-2300 (29-32 kcal/kg bw) Protein:  95-110 g (1.3-1.5 g/kg bw) Fluid:  >1.8 L (25 ml/kg bw)  EDUCATION NEEDS:  No education needs identified at this time  Christophe LouisNathan Franks RD, LDN, CNSC Clinical Nutrition Pager: [phone] 02/27/2016 4:50 PM

## 2016-02-27 NOTE — Progress Notes (Signed)
Physical Therapy Session Note  Patient Details  Name: [name] T Harrel MRN: [id number] Date of Birth: [birth year]  Today's Date: 02/27/2016 PT Individual Time: [id number] PT Individual Time Calculation (min): 32 min   Skilled Therapeutic Interventions/Progress Updates:   Tx focused no functional mobility training and functional anterior weight shifting/heel cord stretching. Pt sleeping in bed upon arrival, daughter present. Pt not bothered by present pain level, but needing to toilet.   Supine>sit via logrolling and Max A for each step.  Pt need min A for static sitting balance and Mod A for dynamic sitting balance EOB x5 min during functional tasks due to posterior lean and heavy UE compensation, wanting to pull on PT.   Sit<>stand x5 throughout with Max A and Max cues for technique and blocking feet to prevent slipping. Pt needs multimodal cues for postural control and upright head/trunk. Pt stood x731min with Max A and strong posterior lean, unable to modifiy with cues.  Squat/stand-pivot transfer with Max A 4 throughout with best success while fully flexing trunk prior to transition to prevent posterior lean. Toilet transfer as well with continent bowel movement.   Pt left up in recliner with all needs in reach. NT bringing lap belt for safety. Modified tx prn for pain.   Therapy Documentation Precautions:  Precautions Precautions: Fall Precaution Comments: recent LAP with mod ABD drainage, 1 drains, 1 bulb    , PCA, trach capped Restrictions Weight Bearing Restrictions: No General:   Vital Signs:   Pain: Pain Assessment Pain Assessment: Faces Faces Pain Scale: Hurts a little bit Pain Type: Surgical pain Pain Location: Abdomen Patients Stated Pain Goal: 2   See Function Navigator for Current Functional Status.   Therapy/Group: Individual Therapy  Silvestre MomentKAMPEN, Rupinder Livingston M  Clio Gerhart, [place]PT [phone]   02/27/2016, 12:36 PM

## 2016-02-27 NOTE — Progress Notes (Signed)
PHARMACY - ADULT TOTAL PARENTERAL NUTRITION CONSULT NOTE   Pharmacy Consult for TPN Indication: PTA TPN continuation; bowel perforation, then new EC fistula 12/30  Insulin Requirements:  SSI:  1 units SSI needed in past 24 hours TPN: 10 units regular insulin per day in TPN   Current Nutrition:  - NPO - cyclic TPN  IVF:  none  Central access: PICC TPN start date: PTA, restarted on admission on 12/6    Recent Labs  02/25/16 0351 02/26/16 0405 02/27/16 0421  NA  --  136 136  K  --  3.9 3.8  CL  --  105 106  CO2  --  25 24  GLUCOSE  --  118* 103*  BUN  --  33* 27*  CREATININE  --  0.61 0.68  CALCIUM  --  8.7* 8.9  PHOS 4.1  --   --   MG 1.7  --  1.7  ALBUMIN  --  1.6* 1.4*  ALKPHOS  --  204* 199*  AST  --  30 31  ALT  --  22 25  BILITOT  --  0.2* 0.4   ASSESSMENT                                                                                                          HPI: 40 yoM with hx PUD, CAD, GERD, BPH,  s/p multiple procedures  and extended hospitalization for recurrent partial GOO 2/2 pyloric stricture on TPN for several days, discharged 11/15 with J/G tube in place on TFs, unable to tolerate and had to transition back to TPN via [facility] on 11/22, readmitted 12/6 with partial SBO. Pharmacy consulted to resume TPN. Note: PTA TNA from [place] was as follows: Totals per day: 405 grams (1376 kcal)  of dextrose, 75 grams (300 kcal) of protein and 50 grams ( 480 kcal) of lipid for a total of 2156 kcal/day   Significant events:  12/6: TPN continued on admission.  12/7: Pt pulled out NGT, not replaced, remains NPO 12/11: Awaiting MBS, if no aspiration, will start to advance diet.  Having bowel function.  Continue TPN for now.   12/12: Severe aspiration risk per MBS yesterday - recommend continuing NPO. Osmolite 1.2 CAL via TF BID ordered 12/11 at 6pm. Per discussion with CCS PA, continue TPN at current goal rate today, wean tomorrow if tolerates TF. 12/13: Did not  tolerate bolus feeds through G-tube; overnight emesis and aspiration event, started on Zosyn. CCS trying trickle feeds today. 12/14 transition to cyclic TPN - appears recovery may be prolonged. Not tolerating trickle feeds; G-tube to drainage.  12/15: TPN was not run at cyclic rate overnight d/t conflicting information in TPN order. To provide patient full needed nutrition, will continue to run TPN at 83 ml/hr over 24 hr and begin 18-hr cycle with tonight's TPN 12/16: start 12h TPN cycle 12/18: resume trickle tube feed at 5 ml/hr 12/19: Nausea/vomiting, tube feed held. TPN formula changed to E 5/20 from E 5/15 12/21: successful transition of G tube to [place] tube.  Plan scan for patency before resuming tube feeds.  12/22: Upper GI scan in IR today to ensure J-tube working.  12/23: exp lap, lysis of adhesions, segmental small bowel resection, gastrojejunostomy 12/25: TPN converted from cyclic back to continuous due to hypoglycemic event following end of TPN cycle and patient likely to remain hospitalized for at least several more days.  Prealbumin low, falling - Fat Emulsion increased to 10 mL/hr.  Clinimix already at 2L/day. 12/28: to start trickle feeds thru J-tube; G-tube remains on drainage 12/29: TF increased from 10 ml/hr to 20 ml/hr  12/29 PM: TF was found draining from midline incision, CT abdomen c/w bowel perforation at G-J suture site; interval development of abscesses.  TF was stopped and is to remain off until MD orders otherwise. 12/31: percutaneous drainage of LLQ fluid collection. 1/3: EC Fistula - feculent material from drain placed 12/31 after perc drainage  & in midline wound.  1/4: Changed to cyclic TPN over 18 hours 1/8: cycle TPN over 16 hours  Today, 02/27/2016  Glucose: CBGs controlled. 10 units insulin in TPN  Electrolytes: K 3.8 mag 1.7  Renal: SCr WNL, BUN slightly elevated  LFTs:  Alk phos elevated but decreasing.  All others < ULN.  TGs: WNL  Prealbumin:  15.3 (  11/13), 10.8 (12/7), 12.8 (12/11), 13.1 (12/18), 8.9 (12/25), 10 (1/1), 14 (1/8)  Total drain output: 400 ml/24 hr   NUTRITIONAL GOALS                                                                                             RD recs (12/26): Kcal 2000-2200, Protein 100-115g, Fluid 2L/day [facility] TPN: 2156 Kcal/day, Protein 75g/day  Clinimix E 5/15 2000 ml/day + 20% fat emulsion 250 ml/day:100 g/day protein, 1920 Kcal/day. (+++There is currently a Psychologist, prison and probation services of Clinimix solution.  Pharmacy will use Clinimix product based on what we have available in stock+++)  PLAN                                                                                  At 1800 today:  Change formula from 5/20 to 5/15 (we are out of 5/20)  change Cycle from 16 hours to 12 hours: TPN over 12 hours: Clinimix E 5/15 at 50 mls/hr x 1 hour then increase to 190 mls/hr x 14 hours then 50 mls/hr x 1 hour then stop.  This provides 100 gm protein and 1920 kcal/day for 100% protein and 96% kcal needs  Cycle 20% fat emulsion 218m over 12 hours.  Mag 2 gm bolus for repletion, recheck lab on Monday  Take insulin out of TPN  Continue standard multivitamins and trace elements in TPN.  Continue sensitive SSI and custom CBG checks - if OK will DC CBGs and SSI  TPN lab panels on Mondays &  Thursdays  Follow clinical course daily.  Eudelia Bunch, Pharm.D. [phone] 02/27/2016 8:53 AM

## 2016-02-27 NOTE — Progress Notes (Signed)
Patient arrived to rehab at 2135 via [facility] with daughter and wife at the bedside. Admission completed with family present. Rehab routine and safety precautions explained and patient and family verbalized understanding. [name], Phill MutterMelissa Rebecca

## 2016-02-28 ENCOUNTER — Inpatient Hospital Stay (HOSPITAL_COMMUNITY): Payer: Medicare Other | Admitting: Physical Therapy

## 2016-02-28 ENCOUNTER — Inpatient Hospital Stay (HOSPITAL_COMMUNITY): Payer: Medicare Other

## 2016-02-28 DIAGNOSIS — D72829 Elevated white blood cell count, unspecified: Secondary | ICD-10-CM

## 2016-02-28 LAB — GLUCOSE, CAPILLARY
GLUCOSE-CAPILLARY: 104 mg/dL — AB (ref 65–99)
GLUCOSE-CAPILLARY: 105 mg/dL — AB (ref 65–99)
Glucose-Capillary: 102 mg/dL — ABNORMAL HIGH (ref 65–99)
Glucose-Capillary: 118 mg/dL — ABNORMAL HIGH (ref 65–99)

## 2016-02-28 LAB — URINALYSIS, ROUTINE W REFLEX MICROSCOPIC
BILIRUBIN URINE: NEGATIVE
Glucose, UA: NEGATIVE mg/dL
Hgb urine dipstick: NEGATIVE
KETONES UR: NEGATIVE mg/dL
Leukocytes, UA: NEGATIVE
Nitrite: NEGATIVE
PROTEIN: NEGATIVE mg/dL
RBC / HPF: NONE SEEN RBC/hpf (ref 0–5)
SPECIFIC GRAVITY, URINE: 1.018 (ref 1.005–1.030)
SQUAMOUS EPITHELIAL / LPF: NONE SEEN
pH: 5 (ref 5.0–8.0)

## 2016-02-28 LAB — CBC
HCT: 26.9 % — ABNORMAL LOW (ref 39.0–52.0)
Hemoglobin: 8.7 g/dL — ABNORMAL LOW (ref 13.0–17.0)
MCH: 29.1 pg (ref 26.0–34.0)
MCHC: 32.3 g/dL (ref 30.0–36.0)
MCV: 90 fL (ref 78.0–100.0)
PLATELETS: 651 10*3/uL — AB (ref 150–400)
RBC: 2.99 MIL/uL — ABNORMAL LOW (ref 4.22–5.81)
RDW: 15.8 % — AB (ref 11.5–15.5)
WBC: 15.3 10*3/uL — ABNORMAL HIGH (ref 4.0–10.5)

## 2016-02-28 MED ORDER — TRACE MINERALS CR-CU-MN-SE-ZN 10-1000-500-60 MCG/ML IV SOLN
INTRAVENOUS | Status: AC
  Administered 2016-02-28: 18:00:00 via INTRAVENOUS
  Filled 2016-02-28 (×2): qty 2000

## 2016-02-28 MED ORDER — FAT EMULSION 20 % IV EMUL
250.0000 mL | INTRAVENOUS | Status: AC
  Administered 2016-02-28: 250 mL via INTRAVENOUS
  Filled 2016-02-28: qty 250

## 2016-02-28 NOTE — Progress Notes (Signed)
PHARMACY - ADULT TOTAL PARENTERAL NUTRITION CONSULT NOTE   Pharmacy Consult for TPN Indication: PTA TPN continuation; bowel perforation, then new EC fistula 12/30  Insulin Requirements:  SSI: no SSI needed in past 24 hours TPN: no insulin in TPN   Current Nutrition:  - NPO - cyclic TPN  IVF:  none  Central access: PICC TPN start date: PTA, restarted on admission on 12/6    Recent Labs  02/26/16 0405 02/27/16 0421  NA 136 136  K 3.9 3.8  CL 105 106  CO2 25 24  GLUCOSE 118* 103*  BUN 33* 27*  CREATININE 0.61 0.68  CALCIUM 8.7* 8.9  MG  --  1.7  ALBUMIN 1.6* 1.4*  ALKPHOS 204* 199*  AST 30 31  ALT 22 25  BILITOT 0.2* 0.4   ASSESSMENT                                                                                                          HPI: 30 yoM with hx PUD, CAD, GERD, BPH,  s/p multiple procedures  and extended hospitalization for recurrent partial GOO 2/2 pyloric stricture on TPN for several days, discharged 11/15 with J/G tube in place on TFs, unable to tolerate and had to transition back to TPN via [facility] on 11/22, readmitted 12/6 with partial SBO. Pharmacy consulted to resume TPN. Note: PTA TNA from [place] was as follows: Totals per day: 405 grams (1376 kcal)  of dextrose, 75 grams (300 kcal) of protein and 50 grams ( 480 kcal) of lipid for a total of 2156 kcal/day   Significant events:  12/6: TPN continued on admission.  12/7: Pt pulled out NGT, not replaced, remains NPO 12/11: Awaiting MBS, if no aspiration, will start to advance diet.  Having bowel function.  Continue TPN for now.   12/12: Severe aspiration risk per MBS yesterday - recommend continuing NPO. Osmolite 1.2 CAL via TF BID ordered 12/11 at 6pm. Per discussion with CCS PA, continue TPN at current goal rate today, wean tomorrow if tolerates TF. 12/13: Did not tolerate bolus feeds through G-tube; overnight emesis and aspiration event, started on Zosyn. CCS trying trickle feeds today. 12/14  transition to cyclic TPN - appears recovery may be prolonged. Not tolerating trickle feeds; G-tube to drainage.  12/15: TPN was not run at cyclic rate overnight d/t conflicting information in TPN order. To provide patient full needed nutrition, will continue to run TPN at 83 ml/hr over 24 hr and begin 18-hr cycle with tonight's TPN 12/16: start 12h TPN cycle 12/18: resume trickle tube feed at 5 ml/hr 12/19: Nausea/vomiting, tube feed held. TPN formula changed to E 5/20 from E 5/15 12/21: successful transition of G tube to [place] tube. Plan scan for patency before resuming tube feeds.  12/22: Upper GI scan in IR today to ensure J-tube working.  12/23: exp lap, lysis of adhesions, segmental small bowel resection, gastrojejunostomy 12/25: TPN converted from cyclic back to continuous due to hypoglycemic event following end of TPN cycle and patient likely to remain hospitalized for at  least several more days.  Prealbumin low, falling - Fat Emulsion increased to 10 mL/hr.  Clinimix already at 2L/day. 12/28: to start trickle feeds thru J-tube; G-tube remains on drainage 12/29: TF increased from 10 ml/hr to 20 ml/hr  12/29 PM: TF was found draining from midline incision, CT abdomen c/w bowel perforation at G-J suture site; interval development of abscesses.  TF was stopped and is to remain off until MD orders otherwise. 12/31: percutaneous drainage of LLQ fluid collection. 1/3: EC Fistula - feculent material from drain placed 12/31 after perc drainage  & in midline wound.  1/4: Changed to cyclic TPN over 18 hours 1/8: cycle TPN over 16 hours 1/13 cycle TPN over 12 hours, TPN formula changed from E 5/20 to E 5/15 due to shortage,  insulin removed from TPN  Today, 02/28/2016  Glucose: CBGs controlled. CBG 118 while TPN infusing at 190 ml/hr   Electrolytes: no labs today, yesterday: K 3.8 mag 1.7 and given 2 gm mag bolus  Renal: SCr WNL, BUN slightly elevated  LFTs:  Alk phos elevated but decreasing.  All  others < ULN.  TGs: WNL  Prealbumin:  15.3 ( 11/13), 10.8 (12/7), 12.8 (12/11), 13.1 (12/18), 8.9 (12/25), 10 (1/1), 14 (1/8)  Total drain output: 400 ml/24 hr   NUTRITIONAL GOALS                                                                                             RD recs (02/27/16): Kcal 2100-2300, Protein 95 - 110 g, Fluid  > 1.8 L/day [facility] TPN: 2156 Kcal/day, Protein 75g/day  Clinimix E 5/15 2000 ml/day + 20% fat emulsion 250 ml/day:100 g/day protein, 1920 Kcal/day. (+++There is currently a Psychologist, prison and probation services of Clinimix solution.  Pharmacy will use Clinimix product based on what we have available in stock+++)  PLAN                                                                                  At 1800 today:  Cycle TPN over 12 hours: Clinimix E 5/15 at 50 mls/hr x 1 hour then increase to 190 mls/hr x 14 hours then 50 mls/hr x 1 hour then stop.  This provides 100 gm protein and 1920 kcal/day for 100% protein and 91% kcal needs  Cycle 20% fat emulsion 264m over 12 hours.  Mag 2 gm bolus for repletion given on Sat 1/13, recheck lab on Monday  Continue standard multivitamins and trace elements in TPN.  DC CBGs and SSI  TPN lab panels on Mondays & Thursdays  Follow clinical course daily.  MEudelia Bunch Pharm.D. [phone]/14/2018 8:04 AM

## 2016-02-28 NOTE — Consult Note (Cosign Needed)
WOC Nurse wound consult note Reason for Consult:[facility]  Wife contacted RN Ramond Marrow(P. Armstrong) from [facility] and reported that she did not bring pattern or spare wound pouch with her when husband was transferred on Friday evening. Nurse Hilda BladesArmstrong notified this Clinical research associatewriter who was working [facility]. Wife happy to see WOC nurse for fistula pouch change today. Wound type:Midline EC fistula Pressure Injury POA: No Measurement:17.5cm x 1.8cm at proximal end, 0.8cm at distal end, depth is 2cm at proximal end Wound bed: 70% red, 30% yellow slough (stringy) at proximal end Drainage (amount, consistency, odor) Effluent is more orange today than it has been in the past.  Periwound:Intact, mild periwound erythema from 5-7 o'clock measuring 1.5cm today Dressing procedure/placement/frequency: Midline pouch due to be changed tomorrow or Tuesday; changed today. Communicated to RN the order information for new (replacement) supplies to be delivered to room (2 Large Eakin pouches, 1 box skin barrier rings).  Old pouch removed using SensiCare adhesive releaser. New pouch cut to fit wound. Wound and periwound skin cleansed, gently patted dry.  Skin barrier rings placed around periphery of wound, pouch attached.  Condensation evident.  Pouch picture-framed with 1-inch paper tape. Patient did not wake during procedure. WOC nursing team will follow for routine fistula pouch changes, and will remain available to this patient, the nursing, surgical and medical teams.  Thanks, Ladona MowLaurie Arrietty Dercole, MSN, RN, GNP, [place] EdenCWOCN, CWON-AP, FAAN  Pager# [phone]

## 2016-02-28 NOTE — Progress Notes (Signed)
[place] PHYSICAL MEDICINE & REHABILITATION     PROGRESS NOTE  Subjective/Complaints:  Pt seen laying in bed this AM.  He slept well overnight per nursing after family requested additional medication.  This AM he is sleepy, however, per nursing pt more alert not too long ago and recently fell back asleep.    ROS: Unable to assess due to cognition.   Objective: Vital Signs: Blood pressure (!) 124/52, pulse 89, temperature 98.6 F (37 C), temperature source Oral, resp. rate 16, height 6\' 2"  (1.88 m), weight 73.6 kg (162 lb 4.1 oz), SpO2 96 %. No results found.  Recent Labs  02/27/16 0421 02/28/16 0615  WBC 10.0 15.3*  HGB 8.3* 8.7*  HCT 25.4* 26.9*  PLT 599* 651*    Recent Labs  02/26/16 0405 02/27/16 0421  NA 136 136  K 3.9 3.8  CL 105 106  GLUCOSE 118* 103*  BUN 33* 27*  CREATININE 0.61 0.68  CALCIUM 8.7* 8.9   CBG (last 3)   Recent Labs  02/27/16 1823 02/28/16 0008 02/28/16 0621  GLUCAP 96 118* 102*    Wt Readings from Last 3 Encounters:  02/28/16 73.6 kg (162 lb 4.1 oz)  02/26/16 78.6 kg (173 lb 4.5 oz)  01/19/16 79.2 kg (174 lb 8 oz)    Physical Exam:  BP (!) 124/52 (BP Location: Left Arm)   Pulse 89   Temp 98.6 F (37 C) (Oral)   Resp 16   Ht 6\' 2"  (1.88 m)   Wt 73.6 kg (162 lb 4.1 oz)   SpO2 96%   BMI 20.83 kg/m  Constitutional: NAD. Vital signs reviewed. Frail.  HENT: Normocephalic and atraumatic.  Eyes: Keeps eyes closed. No discharge.  Cardiovascular: RRR. No JVD. Respiratory: No respiratory distress. Unlabored. GI: He exhibits no distension. BS+. +Eakins pouch LUQ drain  LLQ drain  Musculoskeletal: He exhibits no edema. He exhibits no tenderness.  Neurological: He is alert.  Unable to assess MMT and sensation due to lack of participation, but moving all extremities (previously noted: UE grossly 3-4/5 prox to distal. LE: 2+ to 3/5 HF, 3/5 KE and 4-/5 ADF/PF.) Skin: Skin is warm and dry.   Assessment/Plan: 1. Functional deficits  secondary to debility and encephalopathy which require 3+ hours per day of interdisciplinary therapy in a comprehensive inpatient rehab setting. Physiatrist is providing close team supervision and 24 hour management of active medical problems listed below. Physiatrist and rehab team continue to assess barriers to discharge/monitor patient progress toward functional and medical goals.  Function:  Bathing Bathing position Bathing activity did not occur: Safety/medical concerns    Bathing parts      Bathing assist        Upper Body Dressing/Undressing Upper body dressing Upper body dressing/undressing activity did not occur: Safety/medical concerns                  Upper body assist        Lower Body Dressing/Undressing Lower body dressing Lower body dressing/undressing activity did not occur: Safety/medical concerns                                Lower body assist        Toileting Toileting   Toileting steps completed by patient: Performs perineal hygiene Toileting steps completed by helper: Adjust clothing prior to toileting, Adjust clothing after toileting    Toileting assist Assist level: More than reasonable time, Set  up/obtain supplies   Transfers Chair/bed transfer   Chair/bed transfer method: Stand pivot Chair/bed transfer assist level: Maximal assist (Pt 25 - 49%/lift and lower) Chair/bed transfer assistive device: Armrests     Locomotion Ambulation     Max distance: 12 Assist level: Moderate assist (Pt 50 - 74%)   Wheelchair   Type: Manual Max wheelchair distance: 177ft  Assist Level: Touching or steadying assistance (Pt > 75%)  Cognition Comprehension Comprehension assist level: Understands basic 75 - 89% of the time/ requires cueing 10 - 24% of the time  Expression Expression assist level: Expresses basic 50 - 74% of the time/requires cueing 25 - 49% of the time. Needs to repeat parts of sentences.  Social Interaction Social  Interaction assist level: Interacts appropriately 25 - 49% of time - Needs frequent redirection.  Problem Solving Problem solving assist level: Solves basic 25 - 49% of the time - needs direction more than half the time to initiate, plan or complete simple activities  Memory Memory assist level: Recognizes or recalls 25 - 49% of the time/requires cueing 50 - 75% of the time    Medical Problem List and Plan: 1. Debility and encephalopathy secondary to small bowel obstruction and associated complications   Cont CIR 2. DVT Prophylaxis/Anticoagulation: Pharmaceutical: Lovenox  3. Pain Management: Changed PCA to IV morphine every 4 hours prn severe pain  4. Mood: team to provide ego support. LCSW to follow for evaluation and support.  5. Neuropsych: This patient is not fully capable of making decisions on his own behalf.  6. Skin/Wound Care: Continue air mattress overlay. Continue large Eakin's pouch to manage wound drainage. WOC for management. Encourage side lying to help manage MASD.  7. Malnutrition/Fluids/Electrolytes/Nutrition: continue TPN.  8. Abdominal abscess: Leucocytosis resolving. Continue rocephin  9. Anemia of chronic illness:   Hb 8.3 on 1/13  Cont to monitor 10. Electrolyte abnormality/Acute renal insufficiency: Managed by TNA  11. Encephalopathy: Changed haldol to IM prn for management of agitation/anxiety.  12. Hyperglycemia  Controlled 1/14 13. Leukocytosis  WBCs 15/3 on 1/14  UA/Ucx ordered  CXR ordered  Labs ordered for tomorrow   LOS (Days) 2 A FACE TO FACE EVALUATION WAS PERFORMED  Ankit Karis Juba 02/28/2016 7:59 AM

## 2016-02-28 NOTE — Progress Notes (Signed)
Physical Therapy Note  Patient Details  Name: [name] MRN: [id number] Date of Birth: 07-Jun-1940 Today's Date: 02/28/2016    Time: 1445-1526 41 minutes  1:1 No c/o pain.  Pt asleep upon PT arrival but easily aroused to light touch. Pt performed bed mobility rolling and supine to sit with max A, cues for UE placement.  Squat pivot transfer to w/c with mod/max A, pt able to initiate scooting to edge of bed and reaching for w/c to assist with transfer.  Sit to stand multiple attempts with focus on standing balance with anterior wt shift. Pt performed sit to stand x 6 during session initially max A, progressing to mod A with repetition. Pt able to stand standing and perform wt shifts with mod manual facilitation for balance.  Pt able to remain standing 30 seconds to 1 minute each trial.  Pt's wife present and states she is pleased with progress.    [name],[name] 02/28/2016, 3:26 PM

## 2016-02-29 ENCOUNTER — Inpatient Hospital Stay (HOSPITAL_COMMUNITY): Payer: Medicare Other | Admitting: Occupational Therapy

## 2016-02-29 ENCOUNTER — Inpatient Hospital Stay (HOSPITAL_COMMUNITY): Payer: Medicare Other | Admitting: Physical Therapy

## 2016-02-29 DIAGNOSIS — Z789 Other specified health status: Secondary | ICD-10-CM

## 2016-02-29 LAB — URINE CULTURE: CULTURE: NO GROWTH

## 2016-02-29 LAB — COMPREHENSIVE METABOLIC PANEL
ALT: 32 U/L (ref 17–63)
AST: 39 U/L (ref 15–41)
Albumin: 1.6 g/dL — ABNORMAL LOW (ref 3.5–5.0)
Alkaline Phosphatase: 184 U/L — ABNORMAL HIGH (ref 38–126)
Anion gap: 8 (ref 5–15)
BUN: 54 mg/dL — AB (ref 6–20)
CHLORIDE: 106 mmol/L (ref 101–111)
CO2: 20 mmol/L — AB (ref 22–32)
CREATININE: 0.84 mg/dL (ref 0.61–1.24)
Calcium: 9 mg/dL (ref 8.9–10.3)
GFR calc non Af Amer: >60 mL/min (ref >60)
Glucose, Bld: 97 mg/dL (ref 65–99)
POTASSIUM: 4.5 mmol/L (ref 3.5–5.1)
SODIUM: 134 mmol/L — AB (ref 135–145)
Total Bilirubin: 0.2 mg/dL — ABNORMAL LOW (ref 0.3–1.2)
Total Protein: 6.9 g/dL (ref 6.5–8.1)

## 2016-02-29 LAB — TRIGLYCERIDES: TRIGLYCERIDES: 122 mg/dL (ref <150)

## 2016-02-29 LAB — CBC
HCT: 27.8 % — ABNORMAL LOW (ref 39.0–52.0)
Hemoglobin: 8.9 g/dL — ABNORMAL LOW (ref 13.0–17.0)
MCH: 29.2 pg (ref 26.0–34.0)
MCHC: 32 g/dL (ref 30.0–36.0)
MCV: 91.1 fL (ref 78.0–100.0)
PLATELETS: 571 10*3/uL — AB (ref 150–400)
RBC: 3.05 MIL/uL — AB (ref 4.22–5.81)
RDW: 15.9 % — AB (ref 11.5–15.5)
WBC: 13.7 10*3/uL — AB (ref 4.0–10.5)

## 2016-02-29 LAB — DIFFERENTIAL
BASOS ABS: 0.1 10*3/uL (ref 0.0–0.1)
Basophils Relative: 1 %
EOS PCT: 1 %
Eosinophils Absolute: 0.1 10*3/uL (ref 0.0–0.7)
LYMPHS ABS: 4.8 10*3/uL — AB (ref 0.7–4.0)
LYMPHS PCT: 35 %
MONOS PCT: 14 %
Monocytes Absolute: 1.9 10*3/uL — ABNORMAL HIGH (ref 0.1–1.0)
Neutro Abs: 6.8 10*3/uL (ref 1.7–7.7)
Neutrophils Relative %: 49 %

## 2016-02-29 LAB — PREALBUMIN: Prealbumin: 21.5 mg/dL (ref 18–38)

## 2016-02-29 LAB — PHOSPHORUS: PHOSPHORUS: 4.9 mg/dL — AB (ref 2.5–4.6)

## 2016-02-29 LAB — MAGNESIUM: MAGNESIUM: 2 mg/dL (ref 1.7–2.4)

## 2016-02-29 MED ORDER — DEXTROSE 5 % IV SOLN
2.0000 g | INTRAVENOUS | Status: AC
  Administered 2016-02-29 – 2016-03-02 (×3): 2 g via INTRAVENOUS
  Filled 2016-02-29 (×3): qty 2

## 2016-02-29 MED ORDER — FAT EMULSION 20 % IV EMUL
250.0000 mL | INTRAVENOUS | Status: AC
  Administered 2016-02-29: 250 mL via INTRAVENOUS
  Filled 2016-02-29: qty 250

## 2016-02-29 MED ORDER — SODIUM CHLORIDE 0.9 % IV SOLN: 75.0000 mL/h | INTRAVENOUS | Status: DC

## 2016-02-29 MED ORDER — SODIUM CHLORIDE 0.9 % IV SOLN
INTRAVENOUS | Status: DC
  Administered 2016-02-29 – 2016-03-02 (×5): via INTRAVENOUS

## 2016-02-29 MED ORDER — M.V.I. ADULT IV INJ
INTRAVENOUS | Status: AC
  Administered 2016-02-29: 18:00:00 via INTRAVENOUS
  Filled 2016-02-29: qty 2000

## 2016-02-29 NOTE — Progress Notes (Addendum)
Occupational Therapy Session Note  Patient Details  Name: [name] MRN: [id number] Date of Birth: September 22, 1940  Today's Date: 02/29/2016  Session 1 OT Individual Time: 0800-0900 OT Individual Time Calculation (min): 60 min   Session 2 OT Individual Time: 1425-1520 OT Individual Time Calculation (min): 55 min     Short Term Goals: Week 1:  OT Short Term Goal 1 (Week 1): Pt will complete stand pivot transfers to toilet with mod assist OT Short Term Goal 2 (Week 1): Pt will complete bathing with mod assist at sit > stand level OT Short Term Goal 3 (Week 1): Pt will sustain attention to task for 4 mins with min cues OT Short Term Goal 4 (Week 1): Pt will complete sit > stand with mod assist to decrease burden of care with LB dressing/hygiene  Skilled Therapeutic Interventions/Progress Updates:  Session 1   1:1 OT session focused on activity tolerance, sitting balance, and bed level bathing. OOB transfers and ADL participation limited this session 2/2 symptomatic orthostatic hypotension. Max A sup<>sit w/ pt reporting increased dizziness upon transition. Instructed pt on ankle pumps, but pt reported worsening dizziness. OT assisted pt back to supine. BP in supine- 123/53. Pt assisted back to sitting EOB- after 2-3 minutes sitting- BP 105/44. Pt returned to supine, and bed level bathing/dressing completed with overall Max A. Pt declined to wear pants or t-shirt today despite encouragement. OT donned thigh high TED hose to help with hypotension and bed placed in more upright position to pt tolerance. Pt left with needs met and will notify team of pt status.   Session 2 Pt greeted in bed with daughter present. BP 126/45 in supine prior to activity. Pt participated in LB there-ex- leg raises, ankle pumps, knee extension/knee flexion, and heel slides 10x 2 sets. Pt then completed log roll and side>sit w/ Max A. Upon sitting EOB, increased dizziness, unable to get BP reading, pt reporting  worsening dizziness so returned to supine- BP 97/42 right after return to bed.  29 point drop systolic indicating orthostatic hypotension. Deferred further OOB activities. Pt participated in UB there ex using level 1 yellow/orange thera-band. Triceps press, bicep curl 10x2 sets with rest breaks in between. Pt left with needs met and daughter present at end of session.   Therapy Documentation Precautions:  Precautions Precautions: Fall Precaution Comments: recent LAP with mod ABD drainage, 1 drains, 1 bulb    , PCA, trach capped Restrictions Weight Bearing Restrictions: No  General: General OT Amount of Missed Time: 20 Minutes 2/2 Orthostatic hypotension and fatigue Pain: Pain Assessment Pain Assessment: 0-10 Pain Score: 4  Pain Type: Acute pain Pain Location: Head Pain Descriptors / Indicators: Headache Pain Onset: Gradual Pain Intervention(s): RN made aware;Rest  See Function Navigator for Current Functional Status.   Therapy/Group: Individual Therapy  Valma Cava 02/29/2016, 3:26 PM

## 2016-02-29 NOTE — Progress Notes (Signed)
PHARMACY - ADULT TOTAL PARENTERAL NUTRITION CONSULT NOTE   Pharmacy Consult for TPN Indication: PTA continuation; bowel perforation and new EC fistula 02/13/16  Patient Measurements: Height: 6\' 2"  (188 cm) Weight: 160 lb 11.5 oz (72.9 kg) IBW/kg (Calculated) : 82.2   Body mass index is 20.63 kg/m.  Assessment: [age] yoM with hx PUD, CAD, GERD, BPH,  s/p multiple procedures  and extended hospitalization for recurrent partial GOO 2/2 pyloric stricture on TPN for several days, discharged 11/15 with J/G tube in place on TFs, unable to tolerate and had [facility], readmitted 12/6 with partial SBO. Pharmacy consulted to resume TPN. Now patient in CIR, transferred 02/26/16 Note: PTA TNA from [facility] was as follows: Totals per day: 405 grams (1376 kcal)  of dextrose, 75 grams (300 kcal) of protein and 50 grams ( 480 kcal) of lipid for a total of 2156 kcal/day  Of note, patient failed MBS on 12/12 and did not tolerate bolus feeds through G-tube 12/13. On 12/23, patient had ex lap, lysis of lesions, segmental small bowel resection, and Gastrojejunostomy. On 12/29, TF were found draining from midline insision with CT abd showing bowel perf at GJ sit with development of abscesses - TF off until further notice.   GI: albumin 1.6, prealbumin 21.5 Endo: CBGs contolled - randing from 96-105; SSI has been discontinued Insulin requirements in the past 24 hours: 0 Lytes: Na 134, K 4.5, Phos 4.9 (will continue to monitor - somewhat elevated from 1/11 - 4.1), Mg 2 Renal: Scr 0.84 Pulm:no issues Cards: no issues Hepatobil:LFTs WNL, Trigs 122 Neuro: patient on IV morphine for pain and haldol IM prn for agitation/axiety  ID: WBC 13.7, urine cx pending, CXR improving atelectasis   Ceftriaxone 1/13>>  Best Practices: enoxaparin TPN Access: PICC TPN start date: PTA; [facility]  Nutritional Goals (per RD recommendation on 1/13): kCal: 2100 - 2300/day   Protein: 95 - 110 g/day Fluid: > 1.8 L/day  Current Nutrition:  NPO, ice chips Cyclic TPN  Plan:  Clinimix E 5/15 cyclic TPN at 50 ml/hr x 1 hr, then 190 mL/hr x 10 hrs, then 50 mL/hr x 1 hr then stop Cyclic 20% lipid emulsion at 21 ml/hr x 12 hrs  This provides 100 g of protein and 1920 kCals per day meeting 100% of protein and 91% of kCal needs  Continue MVI  Holding trace elements 1/15 d/t shortage (will add every other day beginning 1/16) Monitor TPN labs Mon and Thu F/U Phos level Tue AM  Isaac BlissMichael Elianah Karis, PharmD, BCPS, BCCCP  Clinical Pharmacist Clinical phone for 02/29/2016 from 7a-3:30p: [phone] If after 3:30p, please call main pharmacy at: [phone] 02/29/2016 8:56 AM

## 2016-02-29 NOTE — Progress Notes (Signed)
Orthopedic Tech Progress Note Patient Details:  Ardyth GalRalph T Bee January 05, 1941 [id number]  Ortho Devices Type of Ortho Device: Abdominal binder Ortho Device/Splint Location: Provided Abdominial Binder as requested by nurse.  Binder placed at bedside and nurse notified.    Alvina ChouWilliams, Britt Petroni C 02/29/2016, 2:46 PM

## 2016-02-29 NOTE — Progress Notes (Signed)
Social Work Assessment and Plan Social Work Assessment and Plan  Patient Details  Name: [name] MRN: [id number] Date of Birth: 1940-04-20  Today's Date: 02/29/2016  Problem List:  Patient Active Problem List   Diagnosis Date Noted  . Leukocytosis   . Hyperglycemia   . FTT (failure to thrive) in adult   . Post-operative pain   . Tracheostomy status (HCC)   . Chronic respiratory failure with hypoxia (HCC)   . Enterocutaneous fistula 02/13/2016  . Anemia of chronic disease 01/31/2016  . Stage 2 skin ulcer of sacral region 01/31/2016  . Hypomagnesemia 01/21/2016  . Hypokalemia 01/21/2016  . SBO (small bowel obstruction) 01/20/2016  . Pressure injury of skin 01/20/2016  . Tracheostomy care (HCC)   . Pleural effusion   . Gastroesophageal reflux disease 01/19/2016  . Insomnia 01/19/2016  . Aspiration pneumonia (HCC) 01/17/2016  . On total parenteral nutrition (TPN) 01/17/2016  . Metabolic acidosis 01/17/2016  . Skin ulcer of abdominal wall, limited to breakdown of skin (HCC) 01/17/2016  . Dysphagia 01/17/2016  . Debility 01/17/2016  . 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 Vitamin B12 deficiency 01/02/2016  . BPH (benign prostatic hyperplasia)   . Posterior vitreous detachment   . Salzmann's nodular dystrophy of left eye   . Hyperopia of both eyes with astigmatism and presbyopia   . Central pterygium of right eye   . Pressure ulcer of buttock, right, unstageable (HCC) 12/30/2015  . Hypernatremia 12/28/2015  . Tracheostomy in place [facility](HCC) 12/25/2015  . Tobacco abuse 12/09/2015  . Ischemic right index finger at tip 12/09/2015  . Gastritis 12/09/2015  . Gastric tube present (LUQ) 12/09/2015  . Acute respiratory failure with hypoxemia (HCC)   . Protein-calorie malnutrition, severe 12/04/2015  . Encephalopathy acute 12/03/2015  . Pyloric stricture 11/24/2015  . Anxiety state 09/28/2015  . Hyperlipidemia   . Acquired stricture of pylorus s/p vagatomy & distal  gastrectomy 09/25/2015 09/25/2015  . Coronary atherosclerosis of native coronary artery 04/24/2013  . Essential hypertension, benign 04/24/2013  . Other and unspecified hyperlipidemia 04/24/2013  . Central pterygium 10/30/2012  . 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 Cataract, nuclear sclerotic senile 10/30/2012   Past Medical History:  Past Medical History:  Diagnosis Date  . Bilateral dry eyes   . Bile peritonitis due to gastric tube dislodgement 12/03/2015  . BPH (benign prostatic hyperplasia)   . Central pterygium of right eye   . Coronary artery disease 1997, 2009   bare mental stent right coronart artery, occlusive followed by Dr. Garnette Scheu[place]Hank Smith in [place]  . Degenerative disc disease    LOWER BACK  . Dysphonia   . ED (erectile dysfunction)   . Gastric outlet obstruction 11/24/2015  . Gastroesophageal reflux disease   . Hyperlipidemia   . Hyperopia of both eyes with astigmatism and presbyopia   . Hypertension   . Nuclear senile cataract    Dr. Bernadette [facility]Hayden [facility] in [place]  . Peptic ulcer   . Pneumatosis of intestines 12/11/2015  . Pneumonia    HISTORY OF IN CHILDHOOD  . Posterior vitreous detachment 10/30/2012  . Salzmann's nodular dystrophy of left eye   . Urinary hesitancy   . Wears glasses    Past Surgical History:  Past Surgical History:  Procedure Laterality Date  . CARDIAC CATHETERIZATION    . CHOLECYSTECTOMY    . ESOPHAGOGASTRODUODENOSCOPY N/A 12/01/2015   Procedure: ESOPHAGOGASTRODUODENOSCOPY (EGD) BALLOON DILATION OF DUODENAL STRICTURE;  Surgeon: Karie SodaSteven Gross, MD;  [facility];  Service: General;  Laterality: N/A;  . ESOPHAGOGASTRODUODENOSCOPY N/A 12/14/2015   Procedure: ESOPHAGOGASTRODUODENOSCOPY (EGD);  Surgeon: Vida Rigger, MD;  [facility];  Service: Endoscopy;  Laterality: N/A;  Patient has tracheostomy  . ESOPHAGOGASTRODUODENOSCOPY (EGD) WITH PROPOFOL N/A 07/01/2015   Procedure:  ESOPHAGOGASTRODUODENOSCOPY (EGD) WITH PROPOFOL;  Surgeon: Willis Modena, MD;  [facility]: [facility];  Service: Endoscopy;  Laterality: N/A;  . ESOPHAGOGASTRODUODENOSCOPY (EGD) WITH PROPOFOL N/A 11/23/2015   Procedure: ESOPHAGOGASTRODUODENOSCOPY (EGD) WITH PROPOFOL;  Surgeon: Willis Modena, MD;  Location: [facility];  Service: Endoscopy;  Laterality: N/A;  may need to intubate  . EYE SURGERY     growth on right eye removed   . IR GENERIC HISTORICAL  12/13/2015   IR REPLC DUODEN/JEJUNO TUBE PERCUT W/FLUORO 12/13/2015 [name], MD [facility]  . IR GENERIC HISTORICAL  02/03/2016   IR REPLC GASTRO/COLONIC TUBE PERCUT W/FLUORO 02/03/2016 Berdine Dance, MD [facility]  . IR GENERIC HISTORICAL  02/04/2016   IR GASTR TUBE CONVERT GASTR-JEJ PER W/FL MOD SED 02/04/2016 Malachy Moan, MD [facility]  . IR GENERIC HISTORICAL  02/25/2016   IR SINUS/FIST TUBE CHK-NON GI 02/25/2016 Irish Lack, MD [facility]  . LAPAROSCOPIC GASTROSTOMY N/A 12/01/2015   Procedure: LAPAROSCOPIC PLACEMENT OF FEEDING JEJUNOSTOMY AND GASTROSTOMY TUBE;  Surgeon: Karie Soda, MD;  Location: [facility];  Service: General;  Laterality: N/A;  . LAPAROSCOPY N/A 12/03/2015   Procedure: LAPAROSCOPY DIAGNOSTIC, OMENTOPEXY, JEJUNOSTOMY, WASH OUT;  Surgeon: Karie Soda, MD;  Location: [facility];  Service: General;  Laterality: N/A;  . LAPAROTOMY N/A 02/06/2016   Procedure: EXPLORATORY LAPAROTOMY, LYSIS OF ADHESIONS, SEGMENTAL SMALL BOWEL RESECTION, GASTROJEJUNOSTOMY;  Surgeon: Chevis Pretty III, MD;  Location: [facility];  Service: General;  Laterality: N/A;  . STENT TO HEART     2 1997 AND 1 IN 1996  . TONSILLECTOMY    . XI ROBOTIC VAGOTOMY AND ANTRECTOMY N/A 09/25/2015   Procedure: XI ROBOTIC ANTERIOR AND POSTERIOR VAGOTOMY, BILROTH I  ANASTOMOSIS DOR FUNDIPLICATION, OMENTOPEXY  UPPER ENDOSCOPY;  Surgeon: Karie Soda, MD;  Location: [facility];  Service: General;  Laterality: N/A;   Social History:  reports that he quit smoking about  21 years ago. His smoking use included Cigarettes. He has a 40.00 pack-year smoking history. He has never used smokeless tobacco. He reports that he does not drink alcohol or use drugs.  Family / Support Systems Marital Status: Married Patient Roles: Spouse, Parent Spouse/Significant Other: Kara Mead [phone]-home  [phone] Children: Estate manager/land agent [phone] Other Supports: Friends and church members Anticipated Caregiver: Wife and daughter Ability/Limitations of Caregiver: Both in good health and aware he may need 24 hr care Caregiver Availability: 24/7 Family Dynamics: Close knit small family, just the three of them. Daughter has taken a leave from work to be here and provide emotional support to pt while here. She and Mom-pt's wife are here daily and are pt's cheerleaders.  Social History Preferred language: English Religion: Christian Cultural Background: No issues Education: Automotive engineer Read: Yes Write: Yes Employment Status: Retired Fish farm manager Issues: No issues Guardian/Conservator: none-according to MD pt is not capable at this time to make his own decisions, will look toward his wife if any need to be made while here.   Abuse/Neglect Physical Abuse: Denies Verbal Abuse: Denies Sexual Abuse: Denies Exploitation of patient/patient's resources: Denies Self-Neglect: Denies  Emotional Status Pt's affect, behavior adn adjustment status: Pt is motivated but so weak. He was haivng blood pressure issues this am during his therapies. He wants to be  able to give it his all, he realizes it will take time to regain his strength since he has been in and out of  a hospital for months. Recent Psychosocial Issues: Multiple medical issues since Aug 2017 when he first had surgery Pyschiatric History: No history deferred depression screen due to exhausted and clsoing his eyes when talking with he and family. Daughter states: " He will do all that he can to get  better." This worker does feel he would benefit from seeing neuro-psych while here due to long hospitalization and multiple surgeries since 11/2015. Substance Abuse History: No issues  Patient / Family Perceptions, Expectations & Goals Pt/Family understanding of illness & functional limitations: Daughter addressed she and Mom talk with the MD's and feel they have a good understanding of dad's condition and plan of care. Both are hopeful he will continue to heal and regain his strength while here on rehab. Premorbid pt/family roles/activities: Husband, father, retiree, church member, Chief Financial Officer, etc Anticipated changes in roles/activities/participation: resume Pt/family expectations/goals: Pt states: " I want to do well here."  Daughter states: " We hope he can get his strength back and heal more to be able to go home."  [employer]: Other (Comment) (Was at [facility] prior to coming back into the hospital) Premorbid Home Care/DME Agencies: Other (Comment) (Has had [facility] after first surgery) Transportation available at discharge: Wife and daughter Resource referrals recommended: Neuropsychology, Support group (specify)  Discharge Planning Living Arrangements: Spouse/significant other Support Systems: Spouse/significant other, Children, Friends/neighbors, Psychologist, clinical community Type of Residence: Private residence Insurance Resources: Media planner (specify) Investment banker, operational) Financial Resources: Restaurant manager, fast food Screen Referred: No Living Expenses: Own Money Management: Spouse, Patient Does the patient have any problems obtaining your medications?: No Home Management: Wife does home management Patient/Family Preliminary Plans: Return home with wife and daughter assisting. Daughter has taken a leave from work to be able to provide support and assist with pt's care. Will await pt's progress and medical issues. He is having blood pressure issues today and  team could only do bedside therapies this morning. Social Work Anticipated Follow Up Needs: HH/OP, Support Group  Clinical Impression Pleasant sleepy gentleman who is willing to try his best to recover from his multiple surgeries and get his strength back to get back home. This is his main goal is to return home after here. Wife and daughter very involved and Supportive and one of them is here daily to provide support to husband/dad. Will benefit from neuro-psych while here, once more awake and able to participate fully. Will work with family on a realistic discharge plan. Will see if daughter's Leave has a set end date.   Lucy Chris 02/29/2016, 1:48 PM

## 2016-02-29 NOTE — Progress Notes (Signed)
Spoke with CIR PA-C this AM over the phone.  - plan to continue rocephin until 03/02/16 to complete a 2 week course - NO enteral meds. Concern than meds placed through G-tube will be expressed through the fistula.    Hosie SpangleElizabeth Maguadalupe Lata, PA-C [facility] Pager: [phone] Consults: [phone] Mon-Fri 7:00 am-4:30 pm Sat-Sun 7:00 am-11:30 am

## 2016-02-29 NOTE — Progress Notes (Signed)
Meredith Staggers, MD Physician Signed Physical Medicine and Rehabilitation  PMR Pre-admission Date of Service: 02/26/2016 1:54 PM  Related encounter: ED to Hosp-Admission (Discharged) from 01/19/2016 in [place]       [] Hide copied text   [employer] PMR Admission Coordinator Pre-Admission Assessment  Patient: [name] is an 76 y.o., male MRN: [id number] DOB: [birth year] Height: 6' 2"  (188 cm) Weight: 78.6 kg (173 lb 4.5 oz)  Insurance Information HMO: Yes   PPO:       PCP:       IPA:       80/20:       OTHER:  Group # [id number] PRIMARY:  [place]      Policy#: [id number]      Subscriber:  Tye Savoy CM Name:  Sherlynn Stalls      Phone#: [phone]     Fax#: [phone] Pre-Cert#: [id number]      Employer: Retired Benefits:  Phone #: [phone]     Name:  Hanley Seamen. Date: 02-15-16     Deduct:  $0      Out of Pocket Max: $4400      Life Max: unlimited CIR: $345 days 1-5      SNF: $0 days 1-20; $160 days 21-48; $0 days 49-100 Outpatient:  Medical necessity     Co-Pay: $40/visit Home Health: 100%      Co-Pay: none DME: 80%     Co-Pay: 20% Providers: in network  Emergency Contact Information        Contact Information    Name Relation Home Work Mobile   Quavon, Keisling Spouse [phone]  [phone]   Blackman,[name] Daughter   [phone]      Current Medical History  Patient Admitting Diagnosis:  Debility  History of Present Illness: A 76 year old male with history of CAD, chronic back pain, COPD, GERD with dysphonia, PUD due to ulcer and abdominal pain who underwent distal gastrectomy with vagotomy 09/2015 due to acquired stricture. Post op course complicated by gastric ileus requiring jejunostomy, encephalopathy and FTT. He developed gastric leak due to jejunal disruption after pulling out his tube and required wash out with omentopexy 10/19, VDRF --tracheostomy, TPN as well as tube feeds and  discharged to SNF for care and monitoring. He was readmitted on 01/19/16 due to abdominal pain with severe vomiting and unable to tolerate oral intake. He was found to have partial SBO treated conservatively without improvement and taken to OR on 12/23 for exp lap with lysis of adhesions, segmental SB resection and gastrojejunostomy. He developed drainage of enteric contents from midline incision on 12/29 and was found to have LLQ abscess with ED fistula requiring percutaneous drain placed by IVR on 12/31.  He continues on TPN for severe malnutrition and a few ice chips.  He was decanulated 02/24/16 and trach dressing is in place.    Patient's medical record from  [facility] has been reviewed by the rehabilitation admission coordinator and physician.  Past Medical History      Past Medical History:  Diagnosis Date  . Bilateral dry eyes   . Bile peritonitis due to gastric tube dislodgement 12/03/2015  . BPH (benign prostatic hyperplasia)   . Central pterygium of right eye   . Coronary artery disease 1997, 2009   bare mental stent right coronart artery, occlusive followed by Dr. Pernell Dupre in [place]  . Degenerative disc disease    LOWER BACK  . Dysphonia   .  ED (erectile dysfunction)   . Gastric outlet obstruction 11/24/2015  . Gastroesophageal reflux disease   . Hyperlipidemia   . Hyperopia of both eyes with astigmatism and presbyopia   . Hypertension   . Nuclear senile cataract    Dr. [facility] in [place]  . Peptic ulcer   . Pneumatosis of intestines 12/11/2015  . Pneumonia    HISTORY OF IN CHILDHOOD  . Posterior vitreous detachment 10/30/2012  . Salzmann's nodular dystrophy of left eye   . Urinary hesitancy   . Wears glasses     Family History   family history includes Heart attack in his mother.  Prior Rehab/Hospitalizations Has the patient had major surgery during 100 days prior to admission? Yes.  Initially  had surgery 09/25/15, then again 12/01/15, then 12/03/15 and then recent surgery 02/06/16.  He went to [facility] after surgery 12/01/15 for 20 days.  He had [facility] for 4 weeks after surgery in August.               Current Medications See MAR from [facility]  Patients Current Diet:   NPO with TPN ongoing.  Precautions / Restrictions Precautions Precautions: Fall Precaution Comments: recent LAP with mod ABD drainage, 1 drains, 1 bulb    , PCA, trach capped Restrictions Weight Bearing Restrictions: No   Has the patient had 2 or more falls or a fall with injury in the past year?No  Prior Activity Level Community (5-7x/wk): Went to exercise class 3 X a week up until 04/17.  Went out of house 3-4 X a week until 09/29/15.  Prior Functional Level Self Care: Did the patient need help bathing, dressing, using the toilet or eating?  Independent  Indoor Mobility: Did the patient need assistance with walking from room to room (with or without device)? Independent  Stairs: Did the patient need assistance with internal or external stairs (with or without device)? Independent  Functional Cognition: Did the patient need help planning regular tasks such as shopping or remembering to take medications? [place] / Equipment Home Assistive Devices/Equipment: Enteral Feeding Supplies, Feeding equipment, Eyeglasses, Grab bars around toilet, Grab bars in shower, Hand-held shower hose, Wheelchair, [place] [facility] bed, Oxygen, Nebulizer, Vent/Trach supplies Home Equipment: None  Prior Device Use: Indicate devices/aids used by the patient prior to current illness, exacerbation or injury? Manual wheelchair and Walker   Prior Functional Level Current Functional Level  Bed Mobility Independent Mod assist  Transfers Independent Mod assist  Mobility - Walk/Wheelchair Independent Min assist (Ambulated 185 ft +2 RW with rest breaks.  Initial LOB and  posterior lean)  Upper Body Dressing Independent    Lower Body Dressing Independent    Grooming Independent Min assist  Eating/Drinking Other (Has not eaten for the past 4 months.) Other (Set up for few ice chips only.)  Toilet Transfer Independent Min assist (Max assist for toilet hygiene and clothing manipulation.)  Bladder Continence  WDL Using urinal  Bowel Management WDL No BM since 02/08/16 according to chart.  No BM since 12/11 according to wife/daughter.  Stair Mining engineer Other (Not tried.)  Communication Verbal and intact Speaks softly, verbal  Memory Mild confusion early mornings, but then clears. Mild impairment  Cooking/Meal Prep Wife does cooking and meal prep.     Housework Wife does housework.   Money Management Independent   Driving Independent     Special needs/care consideration BiPAP/CPAP No CPM No Continuous Drip IV TPN for  malnutrition Dialysis No      Life Vest No Oxygen No Special Bed No Trach Size No Wound Vac (area) No     Skin Has stage II sacral decubitus; has open abdominal wound with pouch and drain present in left abdomen.                          Bowel mgmt: Last BM 02/08/16 per chart; wife reports no BM since 01/25/16 Bladder mgmt: Using urinal Diabetic mgmt No  Previous Home Environment Living Arrangements: Spouse/significant other Available Help at Discharge: Family Type of Home: [place] Name: [place] and rehab Home Layout: One level Home Access: Stairs to enter Entrance Stairs-Rails: Left, Right Entrance Stairs-Number of Steps: 3 Bathroom Shower/Tub: Multimedia programmer: Handicapped height [place]: No Additional Comments: prior to last admission in October, pt discharged to SNF last admission and admitted from SNF  Discharge Living Setting Plans for Discharge Living Setting: Patient's home, House, Lives with (comment) (Lives with wife.) Type of Home at Discharge:  House Discharge Home Layout: One level Discharge Home Access: Stairs to enter Entrance Stairs-Number of Steps: 5 steps per wife. Does the patient have any problems obtaining your medications?: No  Social/Family/Support Systems Patient Roles: Spouse, Parent (Has a wife and a daughter.) Contact Information: Alberto Pina - wife Anticipated Caregiver: wife Anticipated Caregiver's Contact Information: [name] - wife - (h) [phone] (c) [phone] Ability/Limitations of Caregiver: wife Caregiver Availability: 24/7 Discharge Plan Discussed with Primary Caregiver: Yes Is Caregiver In Agreement with Plan?: Yes Does Caregiver/Family have Issues with Lodging/Transportation while Pt is in Rehab?: No  Goals/Additional Needs Patient/Family Goal for Rehab: PT/OT mod I and supervision, SLF supervision to min assist goals Expected length of stay: 10-14 days Cultural Considerations: None Dietary Needs: Currently NPO with TPN on going Equipment Needs: TBD Special Service Needs: Currently patient is on TPN and likely will go home on TPN. Additional Information: Patient has a long term care policy per wife and she will have some assistance from them after discharge. Pt/Family Agrees to Admission and willing to participate: Yes Program Orientation Provided & Reviewed with Pt/Caregiver Including Roles  & Responsibilities: Yes  Patient Condition:  I met with patient, his wife and his daughter at the bedside.  I have reviewed all clinicals and feel this patient would benefit from the intense therapies of the inpatient rehab program.  He can tolerate and benefit from 3 hours of therapy a day to regain functional independence so that he can discharge home with his family.  I have shared all findings with rehab MD and I have approval for acute inpatient rehab admission for today.  Preadmission Screen Completed By:  Retta Diones, 02/26/2016 1:54  PM ______________________________________________________________________   Discussed status with Dr. Naaman Plummer on 02/26/16 at 1437 and received telephone approval for admission today.  Admission Coordinator:  Retta Diones, time 1437/Date 02/26/16   Assessment/Plan: Diagnosis: debility related to small bowel obstruction and subsequent complications 1. Does the need for close, 24 hr/day  Medical supervision in concert with the patient's rehab needs make it unreasonable for this patient to be served in a less intensive setting? Yes 2. Co-Morbidities requiring supervision/potential complications: chronic daily wound care/ostomy mgt, malnutrition requiring TPN, pain control requiring daily adjustment 3. Due to bladder management, bowel management, safety, skin/wound care, disease management, medication administration, pain management and patient education, does the patient require 24 hr/day rehab nursing? Yes 4. Does  the patient require coordinated care of a physician, rehab nurse, PT (1-2 hrs/day, 5 days/week), OT (1-2 hrs/day, 5 days/week) and SLP (1-2 hrs/day, 5 days/week) to address physical and functional deficits in the context of the above medical diagnosis(es)? Yes Addressing deficits in the following areas: balance, endurance, locomotion, strength, transferring, bowel/bladder control, bathing, dressing, feeding, grooming, toileting, cognition, swallowing and psychosocial support 5. Can the patient actively participate in an intensive therapy program of at least 3 hrs of therapy 5 days a week? Yes 6. The potential for patient to make measurable gains while on inpatient rehab is excellent 7. Anticipated functional outcomes upon discharge from inpatients are: modified independent and supervision PT, modified independent and supervision OT, supervision and min assist SLP 8. Estimated rehab length of stay to reach the above functional goals is: 10-14 days 9. Does the patient have adequate social  supports to accommodate these discharge functional goals? Yes 10. Anticipated D/C setting: Home 11. Anticipated post D/C treatments: [place] therapy 12. Overall Rehab/Functional Prognosis: excellent    RECOMMENDATIONS: This patient's condition is appropriate for continued rehabilitative care in the following setting: CIR Patient has agreed to participate in recommended program. Yes Note that insurance prior authorization may be required for reimbursement for recommended care.  Comment: admit to inpatient rehab today.   Meredith Staggers, MD, [place] Physical Medicine & Rehabilitation 02/26/2016   Retta Diones 02/26/2016

## 2016-02-29 NOTE — Progress Notes (Signed)
Nursing reports decrease in UOP and concerns about dehydration with dizziness today. Chart reviewed patient was on 10 cc/hr of IVF but now with increased needs. Discussed with pharmacy who reports reviewed records and reports fluid loss from drain with > 2000 cc output in 24 hour period.  They do not handle IVF and that fluids are usually managed prn depending on renal status and fluid needs. Will start IVF 100 cc/hr and check daily labs for now to help determine fluid needs.

## 2016-02-29 NOTE — Care Management Note (Signed)
Inpatient Rehabilitation Center Individual Statement of Services  Patient Name:  Danny GalRalph T [name]  Date:  02/29/2016  Welcome to the Inpatient Rehabilitation Center.  Our goal is to provide you with an individualized program based on your diagnosis and situation, designed to meet your specific needs.  With this comprehensive rehabilitation program, you will be expected to participate in at least 3 hours of rehabilitation therapies Monday-Friday, with modified therapy programming on the weekends.  Your rehabilitation program will include the following services:  Physical Therapy (PT), Occupational Therapy (OT), Speech Therapy (ST), 24 hour per day rehabilitation nursing, Neuropsychology, Case Management (Social Worker), Rehabilitation Medicine, Nutrition Services and Pharmacy Services  Weekly team conferences will be held on Wednesday to discuss your progress.  Your Social Worker will talk with you frequently to get your input and to update you on team discussions.  Team conferences with you and your family in attendance may also be held.  Expected length of stay: 14-21 days  Overall anticipated outcome: min assist level  Depending on your progress and recovery, your program may change. Your Social Worker will coordinate services and will keep you informed of any changes. Your Social Worker's name and contact numbers are listed  below.  The following services may also be recommended but are not provided by the Inpatient Rehabilitation Center:   Driving Evaluations  Home Health Rehabiltiation Services  Outpatient Rehabilitation Services    Arrangements will be made to provide these services after discharge if needed.  Arrangements include referral to agencies that provide these services.  Your insurance has been verified to be:  UHC-Medicare Your primary doctor is:  Kirby FunkJohn Griffin  Pertinent information will be shared with your doctor and your insurance company.  Social Worker:  Dossie DerBecky  Jarvis Knodel, SW [phone] or [phone]) [phone]  Information discussed with and copy given to patient by: Lucy Chrisupree, Lanee Chain G, 02/29/2016, 1:31 PM

## 2016-02-29 NOTE — Progress Notes (Signed)
PHYSICAL MEDICINE & REHABILITATION     PROGRESS NOTE  Subjective/Complaints:  Pt seen laying in bed.  He is more alert this AM, states he slept well overnight.    ROS: Appears to deny CP, SOB, N/V/D.  Objective: Vital Signs: Blood pressure (!) 120/50, pulse 98, temperature 98.7 F (37.1 C), temperature source Oral, resp. rate 18, height 6\' 2"  (1.88 m), weight 72.9 kg (160 lb 11.5 oz), SpO2 93 %. Dg Chest 2 View  Result Date: 02/28/2016 CLINICAL DATA:  Leukocytosis.  History of hypertension. EXAM: CHEST  2 VIEW COMPARISON:  02/04/2016 FINDINGS: Cardiac silhouette is normal in size. No mediastinal or hilar masses. No evidence of adenopathy. Small right pleural effusion. Mild bibasilar lung opacity consistent with atelectasis. This has improved from the prior study. Remainder of the lungs is clear. No pneumothorax. Right PICC is stable with its tip in the lower superior vena cava. Tracheostomy tube has been removed since the prior exam. Skeletal structures are intact. IMPRESSION: 1. No acute cardiopulmonary disease. 2. Small right pleural effusion. Bibasilar lung opacity consistent with atelectasis, improved from the prior study. No evidence of pneumonia or pulmonary edema. Electronically Signed   By: Amie Portland M.D.   On: 02/28/2016 13:21    Recent Labs  02/28/16 0615 02/29/16 0605  WBC 15.3* 13.7*  HGB 8.7* 8.9*  HCT 26.9* 27.8*  PLT 651* 571*    Recent Labs  02/27/16 0421 02/29/16 0605  NA 136 134*  K 3.8 4.5  CL 106 106  GLUCOSE 103* 97  BUN 27* 54*  CREATININE 0.68 0.84  CALCIUM 8.9 9.0   CBG (last 3)   Recent Labs  02/28/16 0621 02/28/16 1134 02/28/16 1908  GLUCAP 102* 104* 105*    Wt Readings from Last 3 Encounters:  02/29/16 72.9 kg (160 lb 11.5 oz)  02/26/16 78.6 kg (173 lb 4.5 oz)  01/19/16 79.2 kg (174 lb 8 oz)    Physical Exam:  BP (!) 120/50 (BP Location: Left Arm)   Pulse 98   Temp 98.7 F (37.1 C) (Oral)   Resp 18   Ht 6\' 2"  (1.88  m)   Wt 72.9 kg (160 lb 11.5 oz)   SpO2 93%   BMI 20.63 kg/m  Constitutional: NAD. Vital signs reviewed. Frail.  HENT: Normocephalic and atraumatic.  Eyes: EOMI. No discharge.  Cardiovascular: RRR. No JVD. Respiratory: No respiratory distress. Unlabored. GI: He exhibits no distension. BS+. +Eakins pouch LUQ drain  LLQ drain  Musculoskeletal: He exhibits no edema. He exhibits no tenderness.  Neurological: He is alert.  Motor: B/l UE grossly 4/5 prox to distal.  B/l LE: 3/5 HF, 3/5 KE and 4-/5 ADF/PF. Skin: Skin is warm and dry. Abdominal wounds with dressing.   Assessment/Plan: 1. Functional deficits secondary to debility and encephalopathy which require 3+ hours per day of interdisciplinary therapy in a comprehensive inpatient rehab setting. Physiatrist is providing close team supervision and 24 hour management of active medical problems listed below. Physiatrist and rehab team continue to assess barriers to discharge/monitor patient progress toward functional and medical goals.  Function:  Bathing Bathing position Bathing activity did not occur: Safety/medical concerns    Bathing parts      Bathing assist        Upper Body Dressing/Undressing Upper body dressing Upper body dressing/undressing activity did not occur: Safety/medical concerns                  Upper body assist  Lower Body Dressing/Undressing Lower body dressing Lower body dressing/undressing activity did not occur: Safety/medical concerns                                Lower body assist        Toileting Toileting Toileting activity did not occur: No continent bowel/bladder event Toileting steps completed by patient: Performs perineal hygiene Toileting steps completed by helper: Adjust clothing prior to toileting, Adjust clothing after toileting    Toileting assist Assist level: More than reasonable time, Set up/obtain supplies   Transfers Chair/bed transfer   Chair/bed  transfer method: Stand pivot Chair/bed transfer assist level: Maximal assist (Pt 25 - 49%/lift and lower) Chair/bed transfer assistive device: Armrests     Locomotion Ambulation     Max distance: 12 Assist level: Moderate assist (Pt 50 - 74%)   Wheelchair   Type: Manual Max wheelchair distance: 14100ft  Assist Level: Touching or steadying assistance (Pt > 75%)  Cognition Comprehension Comprehension assist level: Understands basic 75 - 89% of the time/ requires cueing 10 - 24% of the time  Expression Expression assist level: Expresses basic 50 - 74% of the time/requires cueing 25 - 49% of the time. Needs to repeat parts of sentences.  Social Interaction Social Interaction assist level: Interacts appropriately 25 - 49% of time - Needs frequent redirection.  Problem Solving Problem solving assist level: Solves basic 25 - 49% of the time - needs direction more than half the time to initiate, plan or complete simple activities  Memory Memory assist level: Recognizes or recalls 25 - 49% of the time/requires cueing 50 - 75% of the time    Medical Problem List and Plan: 1. Debility and encephalopathy secondary to small bowel obstruction and associated complications   Cont CIR 2. DVT Prophylaxis/Anticoagulation: Pharmaceutical: Lovenox  3. Pain Management: Changed PCA to IV morphine every 4 hours prn severe pain  4. Mood: team to provide ego support. LCSW to follow for evaluation and support.  5. Neuropsych: This patient is not fully capable of making decisions on his own behalf.  6. Skin/Wound Care: Continue air mattress overlay. Continue large Eakin's pouch to manage wound drainage. WOC for management. Encourage side lying to help manage MASD.  7. Malnutrition/Fluids/Electrolytes/Nutrition: continue TPN.  8. Abdominal abscess: Leucocytosis resolving. Continue rocephin  9. Anemia of chronic illness:   Hb 8.9 on 1/15  Cont to monitor 10. Electrolyte abnormality/Acute renal insufficiency:  Managed by TNA   Currently with mild Na+ 11. Encephalopathy: Changed haldol to IM prn for management of agitation/anxiety.  12. Hyperglycemia  Controlled 1/15 13. Leukocytosis  WBCs 13.7 on 1/15  UA ?-, Ucx pending  CXR reviewed, improved atelectasis   LOS (Days) 3 A FACE TO FACE EVALUATION WAS PERFORMED  Issabelle Mcraney Karis Jubanil Saray Capasso 02/29/2016 8:36 AM

## 2016-02-29 NOTE — Progress Notes (Signed)
Patient noted to be orthostatic during therapy sessions today.  Patient verbalized dizziness and was assisted back to lying position in bed until dizziness resolved.  Dr. Allena KatzPatel made aware, no new changes at this time.  Patient is already wearing thigh high ted hose and has abdominal binder order which is not used continuously due to multiple abdominal wounds/drains.  Recommended consulting pharmacy or IV team to adjust IVF order during TPN tonight.  Will follow up.  Dani Gobbleeardon, Lexey Fletes J, RN

## 2016-02-29 NOTE — Progress Notes (Signed)
No xanax needed last night. Incontinent of B & B during night. PVR=160. Foam dressing to sacrum. Tilted side to side, uncomfortable completely off back. Bilateral heels elevated. JP drain flushed with 10ml, with 10ml returned. At 0330, Midline Pouch emptied for 200ml and Gtube emptied for 200ml. [name], [name]

## 2016-02-29 NOTE — Progress Notes (Signed)
Physical Therapy Session Note  Patient Details  Name: [name] MRN: [id number] Date of Birth: 1941-01-07  Today's Date: 02/29/2016 PT Individual Time: 1100-1153 PT Individual Time Calculation (min): 53 min    Short Term Goals: Week 1:  PT Short Term Goal 1 (Week 1): Patient will perform bed mobility with min assist consistently  PT Short Term Goal 2 (Week 1): Patient will ambulate 5050ft with RW and min assist.  PT Short Term Goal 3 (Week 1): Patient will perform WC mobility x15450ft with supervison assist.  PT Short Term Goal 4 (Week 1): Patient will perform sit<>stand and stand pivot transfers with min assist  PT Short Term Goal 5 (Week 1): Patient will maintain standing balance with min assist x 3 minutes   Skilled Therapeutic Interventions/Progress Updates:    Patient sitting up in bed with thigh high TED hose donned and no abdominal binder in room reporting fatigue, asymptomatic and BP at rest 119/45, HR 96, and Sp02 99% on ra. Patient instructed in supine > sit with HOB raised and use of rail with mod-max A overall. Patient sat EOB 2-3 minutes with supervision and verbal cues for anterior weight shift until he c/o dizziness with BP 129/73 siting EOB and patient returned to supine with mod A with supine BP 103/45, RN made aware. Performed rolling R/L using rails with min A and increased time to straighten chuck pad underneath patient and total A +2 to scoot up in bed. Patient with continued c/o dizziness after supine rest break. Instructed in bed level BLE thereapeutic exercise for strengthening and ROM due to orthostatic hypotension and fatigue: heel slides 1 x 10 each LE, SLR with pause 1 x 10 each LE, ankle pumps x 30, SAQ over pillow 1 x 10 each LE, supine hip abduction 1 x 10 each LE, hooklying isometric hip adduction with 5 sec hold 1 x 10 each LE, and bridges x 10. At end of session, supine BP 113/50. Patient left semi reclined in bed with all needs in reach.   Therapy  Documentation Precautions:  Precautions Precautions: Fall Precaution Comments: recent LAP with mod ABD drainage, 1 drains, 1 bulb    , PCA, trach capped Restrictions Weight Bearing Restrictions: No Vital Signs: Therapy Vitals Pulse Rate: 96 BP: (!) 103/45 Patient Position (if appropriate): Lying (after sitting EOB) Oxygen Therapy SpO2: 99 % O2 Device: Not Delivered Pain: Pain Assessment Pain Assessment: No/denies pain Faces Pain Scale: No hurt   See Function Navigator for Current Functional Status.   Therapy/Group: Individual Therapy  Jadalyn Oliveri, Prudencio PairRebecca A 02/29/2016, 12:12 PM

## 2016-02-29 NOTE — Evaluation (Signed)
Speech Language Pathology Assessment and Plan  Patient Details  Name: [name] MRN: [id number] Date of Birth: Apr 22, 1940  SLP Diagnosis: Cognitive Impairments;Dysarthria;Dysphagia  Rehab Potential: Fair ELOS: 14 to 41    Today's Date: 02/29/2016 SLP Individual Time: 6712-4580 SLP Individual Time Calculation (min): 40 min    Problem List: Patient Active Problem List   Diagnosis Date Noted  . Leukocytosis   . Hyperglycemia   . FTT (failure to thrive) in adult   . Post-operative pain   . Tracheostomy status ([place])   . Chronic respiratory failure with hypoxia ([place])   . Enterocutaneous fistula 02/13/2016  . Anemia of chronic disease 01/31/2016  . Stage 2 skin ulcer of sacral region 01/31/2016  . Hypomagnesemia 01/21/2016  . Hypokalemia 01/21/2016  . SBO (small bowel obstruction) 01/20/2016  . Pressure injury of skin 01/20/2016  . Tracheostomy care ([place])   . Pleural effusion   . Gastroesophageal reflux disease 01/19/2016  . Insomnia 01/19/2016  . Aspiration pneumonia ([place]) 01/17/2016  . On total parenteral nutrition (TPN) 01/17/2016  . Metabolic acidosis 99/83/3825  . Skin ulcer of abdominal wall, limited to breakdown of skin ([place]) 01/17/2016  . Dysphagia 01/17/2016  . Debility 01/17/2016  . 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 Vitamin B12 deficiency 01/02/2016  . BPH (benign prostatic hyperplasia)   . Posterior vitreous detachment   . Salzmann's nodular dystrophy of left eye   . Hyperopia of both eyes with astigmatism and presbyopia   . Central pterygium of right eye   . Pressure ulcer of buttock, right, unstageable ([place]) 12/30/2015  . Hypernatremia 12/28/2015  . Tracheostomy in place [facility]) 12/25/2015  . Tobacco abuse 12/09/2015  . Ischemic right index finger at tip 12/09/2015  . Gastritis 12/09/2015  . Gastric tube present (LUQ) 12/09/2015  . Acute respiratory failure with hypoxemia ([place])   . Protein-calorie malnutrition, severe 12/04/2015  .  Encephalopathy acute 12/03/2015  . Pyloric stricture 11/24/2015  . Anxiety state 09/28/2015  . Hyperlipidemia   . 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 Coronary atherosclerosis of native coronary artery 04/24/2013  . Essential hypertension, benign 04/24/2013  . Other and unspecified hyperlipidemia 04/24/2013  . Central pterygium 10/30/2012  . 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 Cataract, nuclear sclerotic senile 10/30/2012   Past Medical History:  Past Medical History:  Diagnosis Date  . Bilateral dry eyes   . Bile peritonitis due to gastric tube dislodgement 12/03/2015  . BPH (benign prostatic hyperplasia)   . Central pterygium of right eye   . Coronary artery disease 1997, 2009   bare mental stent right coronart artery, occlusive followed by Dr. Pernell Dupre in [place]  . Degenerative disc disease    LOWER BACK  . Dysphonia   . ED (erectile dysfunction)   . Gastric outlet obstruction 11/24/2015  . Gastroesophageal reflux disease   . Hyperlipidemia   . Hyperopia of both eyes with astigmatism and presbyopia   . Hypertension   . Nuclear senile cataract    Dr. [facility] in [place]  . Peptic ulcer   . Pneumatosis of intestines 12/11/2015  . Pneumonia    HISTORY OF IN CHILDHOOD  . Posterior vitreous detachment 10/30/2012  . Salzmann's nodular dystrophy of left eye   . Urinary hesitancy   . Wears glasses    Past Surgical History:  Past Surgical History:  Procedure Laterality Date  . CARDIAC CATHETERIZATION    . CHOLECYSTECTOMY    .  ESOPHAGOGASTRODUODENOSCOPY N/A 12/01/2015   Procedure: ESOPHAGOGASTRODUODENOSCOPY (EGD) BALLOON DILATION OF DUODENAL STRICTURE;  Surgeon: Michael Boston, MD;  Location: [facility];  Service: General;  Laterality: N/A;  . ESOPHAGOGASTRODUODENOSCOPY N/A 12/14/2015   Procedure: ESOPHAGOGASTRODUODENOSCOPY (EGD);  Surgeon: Clarene Essex, MD;  Location: [facility];  Service: Endoscopy;  Laterality: N/A;  Patient has tracheostomy  . ESOPHAGOGASTRODUODENOSCOPY (EGD) WITH PROPOFOL N/A 07/01/2015   Procedure: ESOPHAGOGASTRODUODENOSCOPY (EGD) WITH PROPOFOL;  Surgeon: Arta Silence, MD;  Location: [facility];  Service: Endoscopy;  Laterality: N/A;  . ESOPHAGOGASTRODUODENOSCOPY (EGD) WITH PROPOFOL N/A 11/23/2015   Procedure: ESOPHAGOGASTRODUODENOSCOPY (EGD) WITH PROPOFOL;  Surgeon: Arta Silence, MD;  Location: [facility] ENDOSCOPY;  Service: Endoscopy;  Laterality: N/A;  may need to intubate  . EYE SURGERY     growth on right eye removed   . IR GENERIC HISTORICAL  12/13/2015   IR REPLC DUODEN/JEJUNO TUBE PERCUT W/FLUORO 12/13/2015 Marybelle Killings, MD [facility]INTER[facility]  . IR GENERIC HISTORICAL  02/03/2016   IR [place] GASTRO/COLONIC TUBE PERCUT W/FLUORO 02/03/2016 Greggory Keen, MD [facility]  . IR GENERIC HISTORICAL  02/04/2016   IR GASTR TUBE CONVERT GASTR-JEJ PER W/FL MOD SED 02/04/2016 Jacqulynn Cadet, MD [facility]  . IR GENERIC HISTORICAL  02/25/2016   IR SINUS/FIST TUBE CHK-NON GI 02/25/2016 Aletta Edouard, MD [facility]  . LAPAROSCOPIC GASTROSTOMY N/A 12/01/2015   Procedure: LAPAROSCOPIC PLACEMENT OF FEEDING JEJUNOSTOMY AND GASTROSTOMY TUBE;  Surgeon: Michael Boston, MD;  Location[facility];  Service: General;  Laterality: N/A;  . LAPAROSCOPY N/A 12/03/2015   Procedure: LAPAROSCOPY DIAGNOSTIC, OMENTOPEXY, JEJUNOSTOMY, [place] OUT;  Surgeon: Michael Boston, MD;  Location: [facility];  Service: General;  Laterality: N/A;  . LAPAROTOMY N/A 02/06/2016   Procedure: EXPLORATORY LAPAROTOMY, LYSIS OF ADHESIONS, SEGMENTAL SMALL BOWEL RESECTION, GASTROJEJUNOSTOMY;  Surgeon: Autumn Messing III, MD;  Location: [facility];  Service: General;  Laterality: N/A;  . STENT TO HEART     2 1997 AND 1 IN 1996  . TONSILLECTOMY    . XI ROBOTIC VAGOTOMY AND ANTRECTOMY N/A 09/25/2015   Procedure: XI ROBOTIC ANTERIOR AND POSTERIOR VAGOTOMY, BILROTH I  ANASTOMOSIS DOR FUNDIPLICATION, OMENTOPEXY   UPPER ENDOSCOPY;  Surgeon: Michael Boston, MD;  Location: [facility];  Service: General;  Laterality: N/A;    Assessment / Plan / Recommendation Clinical Impression [name] is a 76 year old male with history of CAD, chronic back pain, COPD, GERD with dysphonia, PUD due to ulcer and abdominal pain who underwent distal gastrectomy with vagotomy 09/2015 due to acquired stricture. Post op course complicated by gastric ileus requiring jejunostomy, encephalopathy and FTT. He developed gastric leak due to jejunal disruption after pulling out his tube and required wash out with omentopexy 10/19, VDRF --tracheostomy, TPN as well as tube feeds and discharged to SNF for care and monitoring. He was readmitted on 01/19/16 due to abdominal pain with severe vomiting and unable to tolerate oral intake. He was found to have partial SBO treated conservatively without improvement and taken to OR on 12/23 for exp lap with lysis of adhesions, segmental SB resection and gastrojejunostomy. He developed drainage of enteric contents from midline incision on 12/29 and was found to have LLQ abscess with EC fistula requiring percutaneous drain placed by IVR on 12/31. Drainage from EC fistula from midline incision managed with use of Eakin pouch. Intraabdominal wound cultures positive for moderate Klebsiella pneumoniae, moderate E coli and moderate bacteriodes thetaiotaomicron betalactamase positive. Respiratory status stable without significant secretions and patient decannulated on 01/10. Dr. Johney Maine feels that fistula likely  gastrojejunostomy and should heal with bowel rest --NPO and TNA to help with nutritional supplement. Surgery if fistula present at 6-12 months. LLQ drain injection 01/11 revealed fistula to SB--JP drain changed out to gravity bag. Antibiotics narrowed to Rocephin on 01/3 Mentation improving with confusion resolving and ongoing anxiety/agitation at nights. Therapy ongoing and patient showing improvement in activity  tolerance and mobility. CIR recommended by rehab team. Pt admitted to [facility] on 01/25/17.   Of note, chart review of BSE on 09/29/15,  indicates that "pt's dysarthria presentation is concerning for potential progressive neuro diagnosis - as he demonstrates rapid rate with imprecise articulation and apparent word finding deficits.  Pt's wife reports pt's speech to be more slurred since his medications this am but pt denies this to be accurate and states this is the same.  Also spouse reports pt has been drooling for the last 1 1/2 years."   Bedside Swallow Evaluation and Cognitive-Linguisitc Evaluations performed on 01/28/17. Pt is currently NPO d/t  bowel rest with the exception of ice chips following oral care. When following general aspiration precautions, pt consumed 2 ice chips this session without overt s/s of aspiration. Pt refused further intake.  Pt presents with moderate to severe cognitive deficits complicated by moderately reduced speech intelligibility. Pt given the [facility] as pt was fatigued and didn't want to write (seen after PT session). Pt obtained score of 14 out of possible 22 with average score considered to be above 18. Pt demonstrates deficits in the areas of memory, basic problem solving, attention, and awareness further complicated by decreased speech intelligibility. Pt has history of dysphonia and continues to demonstrate low vocal intensity with overall decreased articulation d/t general weakness. Pt is ~25% intelligible at the phrase level.  Pt would benefit from skilled SLP intervention in order to maximize his functional independence prior to discharge. Anticipate that pt will require 24 hour supervision at time of discharge and follow-up ST services.        SLP Assessment  Patient will need skilled Speech Lanaguage Pathology Services during CIR admission    Recommendations  SLP Diet Recommendations: NPO;Ice chips PRN after oral care Postural Changes and/or Swallow  Maneuvers: Seated upright 90 degrees Oral Care Recommendations: Oral care QID Recommendations for Other Services: Neuropsych consult Patient destination: Home Follow up Recommendations: 24 hour supervision/assistance Equipment Recommended: To be determined    SLP Frequency 3 to 5 out of 7 days   SLP Duration  SLP Intensity  SLP Treatment/Interventions 14 to 18  Minumum of 1-2 x/day, 30 to 90 minutes  Cognitive remediation/compensation;Cueing hierarchy;Speech/Language facilitation;Multimodal communication approach;Patient/family education;Therapeutic Activities    Pain Pain Assessment Pain Assessment: No/denies pain Faces Pain Scale: No hurt  Prior Functioning Cognitive/Linguistic Baseline: Within functional limits Type of Home: House  Lives With: Spouse Available Help at Discharge: Family Vocation: Full time employment  Function:  Eating Eating   Modified Consistency Diet:  (Trials of ice chips)             Cognition Comprehension Comprehension assist level: Understands basic 75 - 89% of the time/ requires cueing 10 - 24% of the time  Expression   Expression assist level: Expresses basic 50 - 74% of the time/requires cueing 25 - 49% of the time. Needs to repeat parts of sentences.  Social Interaction Social Interaction assist level: Interacts appropriately 25 - 49% of time - Needs frequent redirection.  Problem Solving Problem solving assist level: Solves basic 25 - 49% of the time - needs direction  more than half the time to initiate, plan or complete simple activities  Memory Memory assist level: Recognizes or recalls 25 - 49% of the time/requires cueing 50 - 75% of the time   Short Term Goals: Week 1: SLP Short Term Goal 1 (Week 1): Pt will utilize speech intelligibility strategies at the pharse level with Mod A verbal cues to achieve 75% intelligibility.  SLP Short Term Goal 2 (Week 1): Pt will complete basic, familiar tasks iwth Mod A verbal cues for functional  problem solving.  SLP Short Term Goal 3 (Week 1): Pt will sustain attention to functional tasks for ~10 minutes with Mod A verbal cues.  SLP Short Term Goal 4 (Week 1): Pt will utilize external memory aids to recall new, daily information with Mod A verbal cues.  SLP Short Term Goal 5 (Week 1): Pt will consistently demonstrate O x 4 with Mod A verbal cues.   Refer to Care Plan for Long Term Goals  Recommendations for other services: Neuropsych  Discharge Criteria: Patient will be discharged from SLP if patient refuses treatment 3 consecutive times without medical reason, if treatment goals not met, if there is a change in medical status, if patient makes no progress towards goals or if patient is discharged from hospital.  The above assessment, treatment plan, treatment alternatives and goals were discussed and mutually agreed upon: by patient  [name] 02/29/2016, 1:12 PM

## 2016-02-29 NOTE — Progress Notes (Signed)
Patient examined--daughter reports that drainage from midline incision as well as G tube has increased com [facility]  Discussed amount of drainage from midline as well as G tube with Dr. Doylene Canardonner.  She felt that this was to be expected and to manage dehydration with IVF. They will follow up with patient in am for evaluation and input.

## 2016-03-01 ENCOUNTER — Inpatient Hospital Stay (HOSPITAL_COMMUNITY): Payer: Medicare Other | Admitting: Occupational Therapy

## 2016-03-01 ENCOUNTER — Inpatient Hospital Stay (HOSPITAL_COMMUNITY): Payer: Medicare Other | Admitting: Physical Therapy

## 2016-03-01 ENCOUNTER — Inpatient Hospital Stay (HOSPITAL_COMMUNITY): Payer: Medicare Other | Admitting: Speech Pathology

## 2016-03-01 ENCOUNTER — Inpatient Hospital Stay (HOSPITAL_COMMUNITY): Payer: Medicare Other

## 2016-03-01 DIAGNOSIS — I951 Orthostatic hypotension: Secondary | ICD-10-CM

## 2016-03-01 DIAGNOSIS — D638 Anemia in other chronic diseases classified elsewhere: Secondary | ICD-10-CM

## 2016-03-01 DIAGNOSIS — G479 Sleep disorder, unspecified: Secondary | ICD-10-CM

## 2016-03-01 LAB — BASIC METABOLIC PANEL
ANION GAP: 6 (ref 5–15)
BUN: 51 mg/dL — ABNORMAL HIGH (ref 6–20)
CALCIUM: 8.5 mg/dL — AB (ref 8.9–10.3)
CO2: 21 mmol/L — ABNORMAL LOW (ref 22–32)
CREATININE: 0.73 mg/dL (ref 0.61–1.24)
Chloride: 108 mmol/L (ref 101–111)
GFR calc Af Amer: >60 mL/min (ref >60)
Glucose, Bld: 99 mg/dL (ref 65–99)
Potassium: 4.3 mmol/L (ref 3.5–5.1)
SODIUM: 135 mmol/L (ref 135–145)

## 2016-03-01 LAB — CBC
HCT: 26.1 % — ABNORMAL LOW (ref 39.0–52.0)
Hemoglobin: 8.3 g/dL — ABNORMAL LOW (ref 13.0–17.0)
MCH: 29.1 pg (ref 26.0–34.0)
MCHC: 31.8 g/dL (ref 30.0–36.0)
MCV: 91.6 fL (ref 78.0–100.0)
PLATELETS: 527 10*3/uL — AB (ref 150–400)
RBC: 2.85 MIL/uL — AB (ref 4.22–5.81)
RDW: 16.2 % — AB (ref 11.5–15.5)
WBC: 10.6 10*3/uL — AB (ref 4.0–10.5)

## 2016-03-01 LAB — PHOSPHORUS: PHOSPHORUS: 4.2 mg/dL (ref 2.5–4.6)

## 2016-03-01 MED ORDER — TRACE MINERALS CR-CU-MN-SE-ZN 10-1000-500-60 MCG/ML IV SOLN
INTRAVENOUS | Status: AC
  Administered 2016-03-01 (×2): via INTRAVENOUS
  Filled 2016-03-01: qty 2000

## 2016-03-01 MED ORDER — LIDOCAINE HCL 2 % EX GEL
CUTANEOUS | Status: DC | PRN
  Administered 2016-03-17: 5 via TOPICAL

## 2016-03-01 MED ORDER — LORAZEPAM 2 MG/ML IJ SOLN
0.5000 mg | Freq: Every day | INTRAMUSCULAR | Status: DC
  Administered 2016-03-01 – 2016-03-02 (×2): 0.5 mg via INTRAVENOUS
  Filled 2016-03-01 (×2): qty 1

## 2016-03-01 MED ORDER — FAT EMULSION 20 % IV EMUL
250.0000 mL | INTRAVENOUS | Status: AC
  Administered 2016-03-01: 250 mL via INTRAVENOUS
  Filled 2016-03-01: qty 250

## 2016-03-01 MED ORDER — IOPAMIDOL (ISOVUE-300) INJECTION 61%
INTRAVENOUS | Status: AC
  Administered 2016-03-01: 120 mL
  Filled 2016-03-01: qty 150

## 2016-03-01 MED ORDER — IOPAMIDOL (ISOVUE-300) INJECTION 61%
150.0000 mL | Freq: Once | INTRAVENOUS | Status: AC | PRN
  Administered 2016-03-01: 120 mL

## 2016-03-01 NOTE — Progress Notes (Signed)
[facility] Progress Note     Subjective: Asked to evaluate patient due to hypotension and increasing drain output. Denies abdominal pain. Having bowel function. Having dizziness upon standing.  Abdominal drain left abdomen: 15 cc today, 35 cc/24h Ventral abdominal drain: 25 cc today, 600 cc/24h Gastrostomy tube: 450 cc today, 875cc/24h >1.5 L fluid loss in past 24 h  Objective: Vital signs in last 24 hours: Temp:  [98.1 F (36.7 C)-98.8 F (37.1 C)] 98.1 F (36.7 C) (01/16 0415) Pulse Rate:  [88-96] 88 (01/16 0415) Resp:  [17-18] 17 (01/16 0415) BP: (103-126)/(36-51) 106/42 (01/16 0816) SpO2:  [98 %-99 %] 98 % (01/16 0415) Weight:  [69.5 kg (153 lb 3.2 oz)] 69.5 kg (153 lb 3.2 oz) (01/16 0500) Last BM Date: 02/28/16  Intake/Output from previous day: 01/15 0701 - 01/16 0700 In: 3534.6 [I.V.:3484.6; IV Piggyback:50] Out: 1520 [Urine:500; Drains:1020] Intake/Output this shift: Total I/O In: -  Out: 490 [Drains:490]  PE: Gen:  Alert, NAD, pleasant Abd: Soft, NT/ND, Eakin pouch in place with good seal, LLQ drain site c/d/i, G-tube to gravity  Lab Results:   Recent Labs  02/29/16 0605 03/01/16 0657  WBC 13.7* 10.6*  HGB 8.9* 8.3*  HCT 27.8* 26.1*  PLT 571* 527*   BMET  Recent Labs  02/29/16 0605 03/01/16 0657  NA 134* 135  K 4.5 4.3  CL 106 108  CO2 20* 21*  GLUCOSE 97 99  BUN 54* 51*  CREATININE 0.84 0.73  CALCIUM 9.0 8.5*   CMP     Component Value Date/Time   NA 135 03/01/2016 0657   NA 139 01/14/2016   K 4.3 03/01/2016 0657   CL 108 03/01/2016 0657   CO2 21 (L) 03/01/2016 0657   GLUCOSE 99 03/01/2016 0657   BUN 51 (H) 03/01/2016 0657   BUN 23 (A) 01/14/2016   CREATININE 0.73 03/01/2016 0657   CALCIUM 8.5 (L) 03/01/2016 0657   PROT 6.9 02/29/2016 0605   ALBUMIN 1.6 (L) 02/29/2016 0605   AST 39 02/29/2016 0605   ALT 32 02/29/2016 0605   ALKPHOS 184 (H) 02/29/2016 0605   BILITOT 0.2 (L) 02/29/2016 0605   GFRNONAA >60  03/01/2016 0657   GFRAA >60 03/01/2016 0657   Lipase     Component Value Date/Time   LIPASE 55 (H) 01/20/2016 0158   Anti-infectives: Anti-infectives    Start     Dose/Rate Route Frequency Ordered Stop   02/29/16 2000  cefTRIAXone (ROCEPHIN) 2 g in dextrose 5 % 50 mL IVPB     2 g 100 mL/hr over 30 Minutes Intravenous Every 24 hours 02/29/16 1804 03/03/16 1959   02/27/16 2000  cefTRIAXone (ROCEPHIN) 2 g in dextrose 5 % 50 mL IVPB  Status:  Discontinued     2 g 100 mL/hr over 30 Minutes Intravenous Every 24 hours 02/26/16 2137 02/29/16 1804     Assessment/Plan  S/P  1. Robotic anterior and posterior vagotomy, BI anastomosis, DOR fundoplication, omentopexy for partial GOO 09/2015, DR. Viviann SpareSTEVEN GROSS  2. Feeding Jejunostomy and gastrostomy/EGD balloon dilatation, secondary to edema at anastomosis, 12/01/15, DR. Karie SodaSteven Gross 3. Diagnostic laparoscopy, omentopexy of jejunal disruption, and washout for jejunal disruption (pt pulled out jejunostomy tube)and gastric leak. 12/03/15, Dr. Karie SodaSteven Gross  4. EXPLORATORY LAPAROTOMY, LYSIS OF ADHESIONS, SEGMENTAL SMALL BOWEL RESECTION, GASTROJEJUNOSTOMY , 02/06/16, Dr. Chevis PrettyPaul Toth (for pSBO and persistent sxs GOO)  - New EC fistula midline 02/13/16/CT shows LLQ intraabdominal abscess - CT guided LLQabdominal abscess drainage, 02/14/16; IR Dr. Fredia SorrowYamagata -  high drain output: order placed for 1 to 1 fluid replacements. Continue to replace fluids to prevent hypotension. - will order UGI with small bowel follow through to evaluate ability to start distal tube feeds per jejunostomy tube - discontinue IV abx tomorrow to complete a 2 weeks course  FEN: NPO/TNA  ID: Zosyn 02/06/16 thru 02/17/16; Vancomycin 02/08/16 thru 02/15/16; Rocephin started 02/17/16 d/c 03/02/16 DVT: Heparin/SCD     LOS: 4 days    [name] , [facility] 03/01/2016, 10:06 AM Pager: [phone] Consults: [phone] Mon-Fri 7:00 am-4:30 pm Sat-Sun 7:00  am-11:30 am

## 2016-03-01 NOTE — Progress Notes (Signed)
Occupational Therapy Session Note  Patient Details  Name: [name] MRN: [id number] Date of Birth: [birth year]   Today's Date: 03/01/2016 OT Individual Time: 9518-8416 OT Individual Time Calculation (min): 61 min   Short Term Goals: Week 1:  OT Short Term Goal 1 (Week 1): Pt will complete stand pivot transfers to toilet with mod assist OT Short Term Goal 2 (Week 1): Pt will complete bathing with mod assist at sit > stand level OT Short Term Goal 3 (Week 1): Pt will sustain attention to task for 4 mins with min cues OT Short Term Goal 4 (Week 1): Pt will complete sit > stand with mod assist to decrease burden of care with LB dressing/hygiene  Skilled Therapeutic Interventions/Progress Updates:    1:1 OT session focused on modified bathing/dressing, activity tolerance, improved sit<>stand, and transfer training. Pt initially declined OOB activity, but agreeable with encouragement. BP monitored throughout session.   Max A stand-pivot <>w/c<>recliner.   Grooming tasks completed at the sink with focus on B UE strength/coordination. Pt able to open containers, adjust water temp, and apply toothpaste with encouragement and set-up A. Max A to thread LE's into pants, Max A sit<>stand, and assistance required to pull pants over hips. RN entered room to thread IV through sleeve for UB dressing. Multiple rest breaks required during ADL session. Stand-pivot transfer from w/c to recliner w/ Max A. Pt left with call bell in reach, family present, and needs met.   Supine: 104/36 Bed Chair position: 107/45 Sitting EOB: 115/43 S/p ADLs and functional transfers: 133/42  Therapy Documentation Precautions:  Precautions Precautions: Fall Precaution Comments: recent LAP with mod ABD drainage, 1 drains, 1 bulb    , PCA, trach capped Restrictions Weight Bearing Restrictions: No Pain: Pain Assessment Pain Assessment: No/denies pain Pain Score: 0-No pain  See Function Navigator for Current  Functional Status.   Therapy/Group: Individual Therapy  Valma Cava 03/01/2016, 12:21 PM

## 2016-03-01 NOTE — Progress Notes (Signed)
[place] PHYSICAL MEDICINE & REHABILITATION     PROGRESS NOTE  Subjective/Complaints:  Pt seen laying in bed this AM.  He states he did not sleep well overnight.  Confirmed with nursing.    ROS: Appears to deny CP, SOB, N/V/D.  Objective: Vital Signs: Blood pressure (!) 106/42, pulse 88, temperature 98.1 F (36.7 C), temperature source Oral, resp. rate 17, height 6\' 2"  (1.88 m), weight 69.5 kg (153 lb 3.2 oz), SpO2 98 %. Dg Chest 2 View  Result Date: 02/28/2016 CLINICAL DATA:  Leukocytosis.  History of hypertension. EXAM: CHEST  2 VIEW COMPARISON:  02/04/2016 FINDINGS: Cardiac silhouette is normal in size. No mediastinal or hilar masses. No evidence of adenopathy. Small right pleural effusion. Mild bibasilar lung opacity consistent with atelectasis. This has improved from the prior study. Remainder of the lungs is clear. No pneumothorax. Right PICC is stable with its tip in the lower superior vena cava. Tracheostomy tube has been removed since the prior exam. Skeletal structures are intact. IMPRESSION: 1. No acute cardiopulmonary disease. 2. Small right pleural effusion. Bibasilar lung opacity consistent with atelectasis, improved from the prior study. No evidence of pneumonia or pulmonary edema. Electronically Signed   By: Amie Portland M.D.   On: 02/28/2016 13:21    Recent Labs  02/29/16 0605 03/01/16 0657  WBC 13.7* 10.6*  HGB 8.9* 8.3*  HCT 27.8* 26.1*  PLT 571* 527*    Recent Labs  02/29/16 0605 03/01/16 0657  NA 134* 135  K 4.5 4.3  CL 106 108  GLUCOSE 97 99  BUN 54* 51*  CREATININE 0.84 0.73  CALCIUM 9.0 8.5*   CBG (last 3)   Recent Labs  02/28/16 0621 02/28/16 1134 02/28/16 1908  GLUCAP 102* 104* 105*    Wt Readings from Last 3 Encounters:  03/01/16 69.5 kg (153 lb 3.2 oz)  02/26/16 78.6 kg (173 lb 4.5 oz)  01/19/16 79.2 kg (174 lb 8 oz)    Physical Exam:  BP (!) 106/42   Pulse 88   Temp 98.1 F (36.7 C) (Oral)   Resp 17   Ht 6\' 2"  (1.88 m)   Wt  69.5 kg (153 lb 3.2 oz)   SpO2 98%   BMI 19.67 kg/m  Constitutional: NAD. Vital signs reviewed. Frail.  HENT: Normocephalic and atraumatic.  Eyes: EOMI. No discharge.  Cardiovascular: RRR. No JVD. Respiratory: No respiratory distress. Unlabored. GI: He exhibits no distension. BS+. +Eakins pouch LUQ drain  LLQ drain  Musculoskeletal: He exhibits no edema. He exhibits no tenderness.  Neurological: He is alert.  Motor: B/l UE grossly 4/5 prox to distal.  B/l LE: 3/5 HF, 3/5 KE and 4-/5 ADF/PF (stable). Skin: Skin is warm and dry. Abdominal wounds with dressing.   Assessment/Plan: 1. Functional deficits secondary to debility and encephalopathy which require 3+ hours per day of interdisciplinary therapy in a comprehensive inpatient rehab setting. Physiatrist is providing close team supervision and 24 hour management of active medical problems listed below. Physiatrist and rehab team continue to assess barriers to discharge/monitor patient progress toward functional and medical goals.  Function:  Bathing Bathing position Bathing activity did not occur: Safety/medical concerns Position: Bed  Bathing parts Body parts bathed by patient: Right arm, Left arm, Chest Body parts bathed by helper: Left lower leg, Right lower leg, Left upper leg, Right upper leg  Bathing assist Assist Level:  (Total A)      Upper Body Dressing/Undressing Upper body dressing Upper body dressing/undressing activity did not occur:  Safety/medical concerns What is the patient wearing?: Hospital gown                Upper body assist        Lower Body Dressing/Undressing Lower body dressing Lower body dressing/undressing activity did not occur: Safety/medical concerns What is the patient wearing?: Socks, Kellogged Hose               Socks - Performed by helper: Don/doff left sock, Don/doff right sock           TED Hose - Performed by helper: Don/doff left TED hose, Don/doff right TED hose  Lower  body assist Assist for lower body dressing:  (Total A)      Toileting Toileting Toileting activity did not occur: No continent bowel/bladder event Toileting steps completed by patient: Performs perineal hygiene Toileting steps completed by helper: Adjust clothing prior to toileting, Adjust clothing after toileting    Toileting assist Assist level: Two helpers (per [employer], NT report)   Transfers Chair/bed transfer   Chair/bed transfer method: Stand pivot Chair/bed transfer assist level: 2 helpers (per NT report) Chair/bed transfer assistive device: Armrests     Locomotion Ambulation     Max distance: 12 Assist level: Moderate assist (Pt 50 - 74%)   Wheelchair   Type: Manual Max wheelchair distance: 12800ft  Assist Level: Touching or steadying assistance (Pt > 75%)  Cognition Comprehension Comprehension assist level: Understands basic 75 - 89% of the time/ requires cueing 10 - 24% of the time  Expression Expression assist level: Expresses basic 50 - 74% of the time/requires cueing 25 - 49% of the time. Needs to repeat parts of sentences.  Social Interaction Social Interaction assist level: Interacts appropriately 25 - 49% of time - Needs frequent redirection.  Problem Solving Problem solving assist level: Solves basic 25 - 49% of the time - needs direction more than half the time to initiate, plan or complete simple activities  Memory Memory assist level: Recognizes or recalls 25 - 49% of the time/requires cueing 50 - 75% of the time    Medical Problem List and Plan: 1. Debility and encephalopathy secondary to small bowel obstruction and associated complications   Cont CIR  Increasing outpt from pouch, surgery to evaluate, appreciate recs  No meds through feeding tube 2. DVT Prophylaxis/Anticoagulation: Pharmaceutical: Lovenox  3. Pain Management: Changed PCA to IV morphine every 4 hours prn severe pain  4. Mood: team to provide ego support. LCSW to follow for evaluation and  support.  5. Neuropsych: This patient is not fully capable of making decisions on his own behalf.  6. Skin/Wound Care: Continue air mattress overlay. Continue large Eakin's pouch to manage wound drainage. WOC for management. Encourage side lying to help manage MASD.  7. Malnutrition/Fluids/Electrolytes/Nutrition: continue TPN.  8. Abdominal abscess: Leucocytosis resolving. Continue rocephin until 1/17 9. Anemia of chronic illness:   Hb 8.9 on 1/15  Cont to monitor 10. Electrolyte abnormality/Acute renal insufficiency: Managed by TNA  11. Encephalopathy: Changed haldol to IM prn for management of agitation/anxiety.  12. Hyperglycemia  Controlled 1/16 13. Leukocytosis: Improving  Afebrile  WBCs 10.6 on 1/16  Ucx NG  CXR reviewed, improved atelectasis 14. Orthostasis  Symptomatic, due to sig output from pouch  After discussion with pharmacy, IVF initiated 15. Sleep disturbance  Has tried Haldol without benefit  IV ativan started 1/16   LOS (Days) 4 A FACE TO FACE EVALUATION WAS PERFORMED  Ankit Karis Jubanil [name] 03/01/2016 8:52 AM

## 2016-03-01 NOTE — Plan of Care (Signed)
Problem: RH SKIN INTEGRITY Goal: RH STG SKIN FREE OF INFECTION/BREAKDOWN Remain free from skin breakdown while on rehab unit with min assist  Outcome: Not Progressing Refuses turns Q2-4H.

## 2016-03-01 NOTE — Plan of Care (Signed)
Problem: RH BLADDER ELIMINATION Goal: RH STG MANAGE BLADDER WITH ASSISTANCE STG Manage Bladder With min Assistance   Outcome: Not Progressing Condom cath  Problem: RH SKIN INTEGRITY Goal: RH STG SKIN FREE OF INFECTION/BREAKDOWN Remain free from skin breakdown while on rehab unit with min assist  Outcome: Not Progressing Max assist- refusing turning Goal: RH STG ABLE TO PERFORM INCISION/WOUND CARE W/ASSISTANCE STG Able To Perform Incision/Wound Care With min Assistance.   Outcome: Not Progressing Total assist from caregiver

## 2016-03-01 NOTE — Plan of Care (Signed)
Problem: RH SKIN INTEGRITY Goal: RH STG SKIN FREE OF INFECTION/BREAKDOWN Remain free from skin breakdown while on rehab unit with min assist  Outcome: Not Progressing Continues to be noncompliant with Q4H turns.

## 2016-03-01 NOTE — Progress Notes (Signed)
Physical Therapy Session Note  Patient Details  Name: [name] MRN: [id number] Date of Birth: December 03, 1940  Today's Date: 03/01/2016 PT Individual Time: 1415-1440 PT Individual Time Calculation (min): 25 min    Short Term Goals: Week 1:  PT Short Term Goal 1 (Week 1): Patient will perform bed mobility with min assist consistently  PT Short Term Goal 2 (Week 1): Patient will ambulate 1650ft with RW and min assist.  PT Short Term Goal 3 (Week 1): Patient will perform WC mobility x16150ft with supervison assist.  PT Short Term Goal 4 (Week 1): Patient will perform sit<>stand and stand pivot transfers with min assist  PT Short Term Goal 5 (Week 1): Patient will maintain standing balance with min assist x 3 minutes   Skilled Therapeutic Interventions/Progress Updates:    Patient in bed with wife present initially. BP assessed throughout session with c/o unresolving dizziness 7/10 with sitting upright, see below. Session focused on bed mobility using rail with supervision for supine > sit and mod A to lift BLE onto bed with sit > supine, static sitting tolerance with supervision, and sit <> stand and stand pivot transfers bed <> wheelchair using RW with mod A overall. RN requested patient be returned to bed and transport present to take patient for imaging.   Supine in bed 117/41 Seated EOB 116/57 Seated in wheelchair after stand pivot transfer 122/55  Therapy Documentation Precautions:  Precautions Precautions: Fall Precaution Comments: recent LAP with mod ABD drainage, 1 drains, 1 bulb    , PCA, trach capped Restrictions Weight Bearing Restrictions: No General: PT Amount of Missed Time (min): 20 Minutes PT Missed Treatment Reason: CT/MRI Vital Signs: Therapy Vitals Temp: 98 F (36.7 C) Temp Source: Oral Pulse Rate: 83 Resp: 18 BP: (!) 122/55 Patient Position (if appropriate): Sitting Oxygen Therapy SpO2: 98 % O2 Device: Not Delivered Pain:  Denies pain  See Function  Navigator for Current Functional Status.   Therapy/Group: Individual Therapy  Lipa Knauff, Prudencio PairRebecca A 03/01/2016, 3:45 PM

## 2016-03-01 NOTE — Progress Notes (Signed)
Speech Language Pathology Daily Session Notes  Patient Details  Name: Danny GalRalph T Dethloff MRN: [id number] Date of Birth: 1940/06/20  Today's Date: 03/01/2016  Session 1 SLP Individual Time: [id number] SLP Individual Time Calculation (min): 15 min Session 2 SLP Individual Time: 1305-1330 SLP Individual Time Calculation (min): 25 min   Short Term Goals: Week 1: SLP Short Term Goal 1 (Week 1): Pt will utilize speech intelligibility strategies at the pharse level with Mod A verbal cues to achieve 75% intelligibility.  SLP Short Term Goal 2 (Week 1): Pt will complete basic, familiar tasks iwth Mod A verbal cues for functional problem solving.  SLP Short Term Goal 3 (Week 1): Pt will sustain attention to functional tasks for ~10 minutes with Mod A verbal cues.  SLP Short Term Goal 4 (Week 1): Pt will utilize external memory aids to recall new, daily information with Mod A verbal cues.  SLP Short Term Goal 5 (Week 1): Pt will consistently demonstrate O x 4 with Mod A verbal cues.   Skilled Therapeutic Interventions:   Session 1 Skilled treatment session focused on addressing cognition goals. SLP facilitated session by providing Min question cues for use of external aids for orientation with errors in date and hospital.  SLP also facilitated session with Mod assist question cues to problem solve use of the call bell and identify why he needed to use it.  Continue with current plan of care.  Session 2 Skilled treatment session focused on addressing cognition goals. SLP facilitated session by providing education with patient and wife regarding ways to stimulate his attention.  As patient was a reader audio books in short time periods and discussions following were recommended.  Patient requested to go back to bed and RN and SLP provided Mod Assist +2 to sequence and problem solve task due to lines and drains.  Continue with current plan of care.   Function:  Cognition Comprehension Comprehension  assist level: Understands basic 75 - 89% of the time/ requires cueing 10 - 24% of the time  Expression   Expression assist level: Expresses basic 50 - 74% of the time/requires cueing 25 - 49% of the time. Needs to repeat parts of sentences.  Social Interaction Social Interaction assist level: Interacts appropriately 25 - 49% of time - Needs frequent redirection.  Problem Solving Problem solving assist level: Solves basic 25 - 49% of the time - needs direction more than half the time to initiate, plan or complete simple activities  Memory Memory assist level: Recognizes or recalls 25 - 49% of the time/requires cueing 50 - 75% of the time    Pain Pain Assessment Pain Assessment: No/denies pain x2  Therapy/Group: Individual Therapy x2  Charlane FerrettiMelissa Leeza Heiner, M.A., CCC-SLP [phone]  Olga Seyler 03/01/2016, 4:27 PM

## 2016-03-01 NOTE — Progress Notes (Signed)
PHARMACY - ADULT TOTAL PARENTERAL NUTRITION CONSULT NOTE   Pharmacy Consult for TPN Indication: PTA continuation; bowel perforation and new EC fistula 02/13/16  Patient Measurements: Height: 6\' 2"  (188 cm) Weight: 153 lb 3.2 oz (69.5 kg) IBW/kg (Calculated) : 82.2   Body mass index is 19.67 kg/m.  Assessment: 89 yoM with hx PUD, CAD, GERD, BPH,  s/p multiple procedures  and extended hospitalization for recurrent partial GOO 2/2 pyloric stricture on TPN for several days, discharged 11/15 with J/G tube in place on TFs, unable to tolerate and [facility], readmitted 12/6 with partial SBO. Pharmacy consulted to resume TPN. Now patient in CIR, transferred 02/26/16 Note: PTA TNA from [facility] was as follows: Totals per day: 405 grams (1376 kcal)  of dextrose, 75 grams (300 kcal) of protein and 50 grams ( 480 kcal) of lipid for a total of 2156 kcal/day  Of note, patient failed MBS on 12/12 and did not tolerate bolus feeds through G-tube 12/13. On 12/23, patient had ex lap, lysis of lesions, segmental small bowel resection, and Gastrojejunostomy. On 12/29, TF were found draining from midline insision with CT abd showing bowel perf at GJ sit with development of abscesses - TF off until further notice.   GI: albumin 1.6, prealbumin 21.5, LBM 1/14. Drains output: 1305 mL/24hrs. I/O 24hrs + 2L.  Endo: CBGs contolled - randing from 97-105; SSI has been discontinued Insulin requirements in the past 24 hours: 0 Lytes: Na 135, K 4.3, Phos 4.2 (down to wnl), Mg 2 on 1/15 - IVF NS @ 100 cc/hr.  Renal: Scr 0.73 Pulm:no issues Cards: Orthostatic hypotension - thought to be volume issue, IVF added.  Hepatobil:LFTs WNL, Trigs 122 Neuro: patient on IV morphine for pain and haldol IM prn for agitation/axiety  ID: WBC down 10.6, urine cx pending, CXR improving atelectasis   Ceftriaxone 1/13>> (1/17)  Best Practices: enoxaparin TPN Access: PICC TPN start date: PTA;  [facility]  Nutritional Goals (per RD recommendation on 1/13): kCal: 2100 - 2300/day  Protein: 95 - 110 g/day Fluid: > 1.8 L/day  Current Nutrition:  NPO, ice chips Cyclic TPN  Plan:  Continue Clinimix E 5/15 cyclic TPN at 50 ml/hr x 1 hr, then 190 mL/hr x 10 hrs, then 50 mL/hr x 1 hr then stop. Cyclic 20% lipid emulsion at 21 ml/hr x 12 hrs This provides 100 g of protein and 1920 kCals per day meeting 100% of protein and 91% of kCal needs  Continue MVI in TPN Add trace elements to TPN 1/16 (giving every other day due to national shortage) Monitor TPN labs Mon and Thu Primary team managing IVF - monitor volume status with TPN and possible need to decrease IVF.   Link SnufferJessica Tanzie Rothschild, PharmD, BCPS Clinical Pharmacist Clinical phone for 03/01/2016 from 7a-3:30p: [phone] If after 3:30p, please call main pharmacy at: [phone] 03/01/2016 9:38 AM

## 2016-03-01 NOTE — Consult Note (Signed)
[place] Nurse wound follow up Wound type: Midline wound with EC fistula Recent transfer from [facility] inpatient to inpatient rehab here on the [place] has been increased and more watery, CCS is aware and is in to assess patient with [place] today Drainage (amount, consistency, odor) liquid, watery, light brown Periwound: pouch intact from change per [facility] nurse on Sunday Met with patient and his daughter to introduce myself.  Discussed with patient and his daughter the request from the PA on the rehab unit to hook patient's Eakin's pouch to a BSD bag to allow the higher volumes of output to drain a bit more from the pouch.  Daughter verbalized understanding of rationale.  We can certainly disconnect from this system if his drainage decreases.  He has also had increasing volumes from his Gtube.    Connected Eakin's to BSD bag and secured connection with waterproof tape.  Discussed with CCS team and bedside nurse.  [place] nurse team will follow along with you for support with fistula management.   Anselm Aumiller [facility] MSN, [place], [place], [place]

## 2016-03-01 NOTE — Progress Notes (Signed)
Nutrition Follow-up  DOCUMENTATION CODES:   Non-severe (moderate) malnutrition in context of chronic illness  INTERVENTION:  TPN per Pharmacy.   RD to continue to monitor.   NUTRITION DIAGNOSIS:   Inadequate oral intake related to altered GI function (Enteric fistula) as evidenced by NPO status (TPN dependant).; ongoing  GOAL:   Patient will meet greater than or equal to 90% of their needs; met  MONITOR:   Labs, Weight trends, Skin, I & O's (TPN tolerance)  REASON FOR ASSESSMENT:   Malnutrition Screening Tool    ASSESSMENT:   76 y/o mle w/ complex PMHx including COPD, GERD, CAD, PUD. Underwent gastrectomy in August due to stricture w/ post op complications requiring jejunostomy. He was most recently admitted for SBO and enteric fistula. He is on bowel rest, TPN dependent.   Pt with increased output. Per surgery MD note, Abdominal drain left abdomen: 15 cc today, 35 cc/24h; Ventral abdominal drain: 25 cc today, 600 cc/24h; Gastrostomy tube: 450 cc today, 875cc/24h with total  >1.5 L fluid loss in past 24 h. Plans for fluid replacement and UGI with small bowel follow through to evaluate ability to start distal tube feeds per jejunostomy tube.   Pt continues to be NPO by mouth and by tube. Pt on cyclic TPN. Per Pharmacy note, plans to continue Clinimix E 4/49 cyclic TPN at 50 ml/hr x 1 hr, then 190 mL/hr x 10 hrs, then 50 mL/hr x 1 hr then stop. Cyclic 20% lipid emulsion at 21 ml/hr x 12 hrs TPN regimen to provide 1920 kcal (91% of kcal needs), and 100 grams of protein (100% of protein needs).   RD to continue to monitor. Labs and medications reviewed.   Diet Order:  Diet NPO time specified Except for: Ice Chips TPN (CLINIMIX-E) Adult  Skin:  Wound (see comment) (Midline wound with EC fistula)  Last BM:  1/14  Height:   Ht Readings from Last 1 Encounters:  02/27/16 _0  (1.88 m)    Weight:   Wt Readings from Last 1 Encounters:  03/01/16 153 lb 3.2 oz (69.5 kg)     Ideal Body Weight:  86.36 kg  BMI:  Body mass index is 19.67 kg/m.  Estimated Nutritional Needs:   Kcal:  2100-2300 (29-32 kcal/kg bw)  Protein:  95-110 g (1.3-1.5 g/kg bw)  Fluid:  >1.8 L (25 ml/kg bw)  EDUCATION NEEDS:   No education needs identified at this time  Corrin Parker, MS, RD, LDN Pager # [phone] After hours/ weekend pager # [phone]

## 2016-03-01 NOTE — Progress Notes (Signed)
Physical Therapy Session Note  Patient Details  Name: [name] MRN: [id number] Date of Birth: 04-09-1940  Today's Date: 03/01/2016 PT Individual Time: 0800-0900 PT Individual Time Calculation (min): 60 min    Short Term Goals: Week 1:  PT Short Term Goal 1 (Week 1): Patient will perform bed mobility with min assist consistently  PT Short Term Goal 2 (Week 1): Patient will ambulate 6350ft with RW and min assist.  PT Short Term Goal 3 (Week 1): Patient will perform WC mobility x16650ft with supervison assist.  PT Short Term Goal 4 (Week 1): Patient will perform sit<>stand and stand pivot transfers with min assist  PT Short Term Goal 5 (Week 1): Patient will maintain standing balance with min assist x 3 minutes      Skilled Therapeutic Interventions/Progress Updates:    Pt was found in bed and was willing to do therapy. Pt's vitals in supine before therapy were 106/36. Pt then had bilateral thigh high stockings put on by therapist dependently. Recently ordered BP abdominal binder was placed on pt. To don binder, pt performed L and R bed rolling with use of bedrails and min assist from therapist. Vitals were reassessed after donning abdominal binder and stockings and BP was 106/42 for this reading.   Pt performed therapeutic exercises in supine due to orthostatic hypotension concerns. Pt performed 2x10 supine heel slides and supine SLR bilaterally. 1x10 SAQ with 5 sec hold bilaterally. Pt demonstrated fatigue with later reps of SAQ via inability to maintain 5 sec hold.  2x15 ankle pumps. Pt's vitals were taken again and BP was 114/37 after previously listed exercises.   Pt was repositioned into more upright seated position in the bed by [facility] to begin progression to sitting at EOB. Vitals were taken again and BP was 107/45.   Therapeutic exercises were continued after repostioning with 1x10 seated scapular retractions with yellow theraband and resisted shoulder flexion with yellow  theraband bilaterally. Last exercises performed was 1x15 ankle pumps. Pt was instructed to perform ankle pumps every hour to promote blood return and help alleviate orthostatic symptoms.   Vitals were measured again while still sitting upright in bed. BP was 108/42. PA was informed about steady BP readings from today's morning session. Pt was left in bed with HOB slightly elevated and all needs within reach before leaving room.   Therapy Documentation Precautions:  Precautions Precautions: Fall Precaution Comments: recent LAP with mod ABD drainage, 1 drains, 1 bulb    , PCA, trach capped Restrictions Weight Bearing Restrictions: No General:   Vital Signs: Therapy Vitals BP: (!) 106/42 Patient Position (if appropriate): Lying Pain: Pain Assessment Pain Score: 4    See Function Navigator for Current Functional Status.   Therapy/Group: Individual Therapy  Rudie MeyerJeffrey Avianna Moynahan 03/01/2016, 8:23 AM

## 2016-03-02 ENCOUNTER — Inpatient Hospital Stay (HOSPITAL_COMMUNITY): Payer: Medicare Other | Admitting: Speech Pathology

## 2016-03-02 ENCOUNTER — Inpatient Hospital Stay (HOSPITAL_COMMUNITY): Payer: Medicare Other | Admitting: Physical Therapy

## 2016-03-02 ENCOUNTER — Inpatient Hospital Stay (HOSPITAL_COMMUNITY): Payer: Medicare Other | Admitting: Occupational Therapy

## 2016-03-02 LAB — BASIC METABOLIC PANEL
Anion gap: 6 (ref 5–15)
BUN: 36 mg/dL — AB (ref 6–20)
CALCIUM: 7.9 mg/dL — AB (ref 8.9–10.3)
CHLORIDE: 115 mmol/L — AB (ref 101–111)
CO2: 19 mmol/L — AB (ref 22–32)
CREATININE: 0.59 mg/dL — AB (ref 0.61–1.24)
GLUCOSE: 89 mg/dL (ref 65–99)
POTASSIUM: 3.8 mmol/L (ref 3.5–5.1)
SODIUM: 140 mmol/L (ref 135–145)

## 2016-03-02 LAB — CBC
HEMATOCRIT: 24.8 % — AB (ref 39.0–52.0)
HEMOGLOBIN: 7.8 g/dL — AB (ref 13.0–17.0)
MCH: 29.1 pg (ref 26.0–34.0)
MCHC: 31.5 g/dL (ref 30.0–36.0)
MCV: 92.5 fL (ref 78.0–100.0)
Platelets: 456 10*3/uL — ABNORMAL HIGH (ref 150–400)
RBC: 2.68 MIL/uL — AB (ref 4.22–5.81)
RDW: 16.3 % — ABNORMAL HIGH (ref 11.5–15.5)
WBC: 10.6 10*3/uL — ABNORMAL HIGH (ref 4.0–10.5)

## 2016-03-02 LAB — GLUCOSE, CAPILLARY
GLUCOSE-CAPILLARY: 129 mg/dL — AB (ref 65–99)
Glucose-Capillary: 83 mg/dL (ref 65–99)
Glucose-Capillary: 73 mg/dL (ref 65–99)

## 2016-03-02 MED ORDER — MORPHINE SULFATE 10 MG/5ML PO SOLN
2.5000 mg | ORAL | Status: DC | PRN
  Administered 2016-03-02 – 2016-03-10 (×11): 2.5 mg
  Filled 2016-03-02 (×15): qty 2

## 2016-03-02 MED ORDER — BISACODYL 10 MG RE SUPP
10.0000 mg | RECTAL | Status: DC
  Administered 2016-03-02 – 2016-03-10 (×5): 10 mg via RECTAL
  Filled 2016-03-02 (×6): qty 1

## 2016-03-02 MED ORDER — FAT EMULSION 20 % IV EMUL
240.0000 mL | INTRAVENOUS | Status: AC
  Administered 2016-03-02: 240 mL via INTRAVENOUS
  Filled 2016-03-02: qty 250

## 2016-03-02 MED ORDER — JEVITY 1.2 CAL PO LIQD
1000.0000 mL | Freq: Every day | ORAL | Status: DC
  Administered 2016-03-02 – 2016-03-06 (×5): 1000 mL
  Filled 2016-03-02 (×7): qty 1000

## 2016-03-02 MED ORDER — TROLAMINE SALICYLATE 10 % EX CREA
TOPICAL_CREAM | Freq: Three times a day (TID) | CUTANEOUS | Status: DC
  Filled 2016-03-02: qty 85

## 2016-03-02 MED ORDER — NONFORMULARY OR COMPOUNDED ITEM
0.5000 mg | Freq: Every evening | Status: DC | PRN
  Administered 2016-03-02 – 2016-03-04 (×3): 0.5 mg
  Filled 2016-03-02 (×9): qty 1

## 2016-03-02 MED ORDER — LORAZEPAM 2 MG/ML IJ SOLN: 1.0000 mg | Freq: Every day | INTRAMUSCULAR | Status: DC

## 2016-03-02 MED ORDER — MUSCLE RUB 10-15 % EX CREA
TOPICAL_CREAM | Freq: Three times a day (TID) | CUTANEOUS | Status: DC
  Administered 2016-03-02 – 2016-03-08 (×17): via TOPICAL
  Administered 2016-03-08: 1 via TOPICAL
  Administered 2016-03-09 (×2): via TOPICAL
  Administered 2016-03-10: 1 via TOPICAL
  Administered 2016-03-10 – 2016-03-19 (×20): via TOPICAL
  Administered 2016-03-20: 1 via TOPICAL
  Administered 2016-03-20 – 2016-03-23 (×5): via TOPICAL
  Administered 2016-03-23: 1 via TOPICAL
  Administered 2016-03-23 – 2016-03-29 (×15): via TOPICAL
  Filled 2016-03-02 (×3): qty 85

## 2016-03-02 MED ORDER — MORPHINE SULFATE (PF) 4 MG/ML IV SOLN: 2.0000 mg | INTRAVENOUS | Status: DC | PRN

## 2016-03-02 MED ORDER — ALPRAZOLAM 0.5 MG PO TABS: 0.5000 mg | ORAL_TABLET | Freq: Every day | ORAL | Status: DC

## 2016-03-02 MED ORDER — ADULT MULTIVITAMIN LIQUID CH
15.0000 mL | Freq: Every day | ORAL | Status: DC
  Administered 2016-03-02: 15 mL
  Filled 2016-03-02 (×2): qty 15

## 2016-03-02 MED ORDER — CLINIMIX E/DEXTROSE (4.25/25) 4.25 % IV SOLN
INTRAVENOUS | Status: AC
  Administered 2016-03-02 (×2): via INTRAVENOUS
  Filled 2016-03-02: qty 1000

## 2016-03-02 MED ORDER — SODIUM CHLORIDE 0.9 % IV SOLN
Freq: Every day | INTRAVENOUS | Status: DC
  Administered 2016-03-02 – 2016-03-03 (×2): via INTRAVENOUS
  Administered 2016-03-04: 1000 mL via INTRAVENOUS
  Administered 2016-03-05 – 2016-03-06 (×2): via INTRAVENOUS
  Administered 2016-03-07: 1000 mL via INTRAVENOUS
  Administered 2016-03-08 – 2016-03-09 (×2): via INTRAVENOUS
  Administered 2016-03-10: 950 mL via INTRAVENOUS
  Administered 2016-03-13: 20:00:00 via INTRAVENOUS
  Administered 2016-03-14: 100 mL/h via INTRAVENOUS

## 2016-03-02 MED ORDER — BISACODYL 10 MG RE SUPP: 10.0000 mg | RECTAL | Status: DC | PRN

## 2016-03-02 MED ORDER — FLEET ENEMA 7-19 GM/118ML RE ENEM: 1.0000 | ENEMA | Freq: Once | RECTAL | Status: DC

## 2016-03-02 MED ORDER — LIDOCAINE 5 % EX PTCH
1.0000 | MEDICATED_PATCH | CUTANEOUS | Status: DC
  Administered 2016-03-02 – 2016-03-29 (×27): 1 via TRANSDERMAL
  Filled 2016-03-02 (×28): qty 1

## 2016-03-02 NOTE — Progress Notes (Signed)
[place] NOTE   Pharmacy Consult for TPN Indication: PTA continuation; bowel perforation and new EC fistula 02/13/16  Patient Measurements: Height: 6' 2"  (188 cm) Weight: 152 lb 6.4 oz (69.1 kg) IBW/kg (Calculated) : 82.2   Body mass index is 19.57 kg/m.  Assessment: 55 yoM with hx PUD, CAD, GERD, BPH,  s/p multiple procedures  and extended hospitalization for recurrent partial GOO 2/2 pyloric stricture on TPN for several days, discharged 11/15 with J/G tube in place on TFs, unable to tolerate and had to transition back to TPN via [facility] on 11/22, readmitted 12/6 with partial SBO.  Note: PTA TNA from [place] was as follows: Totals per day: 405 grams (1376 kcal) of dextrose, 75 grams (300 kcal) of protein and 50 grams (480 kcal) of lipid for a total of 2156 kcal/day  Of note, patient failed MBS on 12/12 and did not tolerate bolus feeds through G-tube 12/13. On 12/23, patient had ex lap, lysis of lesions, segmental small bowel resection, and Gastrojejunostomy. On 12/29, TF were found draining from midline incision with CT abd showing bowel perf at [place] sit with development of abscesses.   GI: albumin 1.6, prealbumin 21.5, LBM 1/14. Drains output: 1125 mL/24hr Per surgery, may be able to trial low volume tube feeds vis J tube. DG small bowel study on 1/16 showed retrograde flow of contrast injected into J tube demonstrating EC fistula, no evidence of contrast entering percutaneous drainage catheter in LLQ, and contrast flowing anterograde into cecum  Abdominal drain left abdomen: 15 mL/24h Ventral abdominal drain: 450 mL/24h, decreased Gastrostomy tube: 800 mL/24h  Endo: CBGs contolled - randing from 89-99; SSI has been discontinued Insulin requirements in the past 24 hours: 0 Lytes: Na 140, K 3.8; Phos 4.2 & Mag 2 on 1/15.   Renal: Scr 0.59- IVF NS @ 116m/hr per surgery Pulm:no issues Cards: Orthostatic hypotension - thought to be volume issue,  IVF added- improving Hepatobil: LFTs WNL, alk phos 184 on 1/15, Trigs 122 on 1/15 Neuro: patient on IV morphine for pain and haldol IM prn for agitation/axiety  ID: WBC down 10.6, urine cx pending, CXR improving atelectasis   Ceftriaxone 1/13>> 1/17  Best Practices: enoxaparin TPN Access: PICC TPN start date: PTA[facility]  Nutritional Goals (per RD recommendations): kCal: 2100 - 2300/day  Protein: 95 - 110 g/day Fluid: > 1.8 L/day  Current Nutrition:  NPO, ice chips Cyclic TPN  Plan:  - to start Jevity 1.2kcal today- plan is to start at 290mhr, and increase by 1075m every 4h to goal rate of 36m75m  (if he tolerates/absorbs ~30mL9mof Jevity 1.2 that would give him ~900kcal and ~41g protein over 24h) - Spoke with Dr. KinsiKieth Brightlywith reduction in TPN rate to allow for majority of nutrition to come from TF. Will change to Clinimix E 4.25/1.75/10lic TPN at 50 ml/hr x 1 hr, then 90 mL/hr x 10 hrs, then 50 mL/hr x 1 hr, then stop + cyclic 20% l25%d emulsion at 20 ml/hr x 12hr- provides 42.5g of protein and 1500kcals (based on TF estimates above + TPN order- would provide 2400kcal and 83.5g protein a day, meeting 100% and ~88% of needs, respectively) - Will change multivitamin to 15mL 49my per tube - Add trace elements to TPN every other day due to national shortage- last given 1/16 (if remains on TF, will not need this added to TPN) - Monitor TPN labs Mon and Thu - Primary  team managing IVF  Lian Pounds D. Tamella Tuccillo, PharmD, BCPS Clinical Pharmacist Pager: [phone] 03/02/2016 9:16 AM

## 2016-03-02 NOTE — Patient Care Conference (Signed)
Inpatient RehabilitationTeam Conference and Plan of Care Update Date: 03/02/2016   Time: 2:25 PM    Patient Name: Danny GalRalph T Guardino      Medical Record Number: [id number]  Date of Birth: [birth year] Sex: Male         Room/Bed: 4W06C/4W06C-01 Payor Info: Payor: [employer] MEDICARE / Plan: UHC MEDICARE / Product Type: *No Product type* /    Admitting Diagnosis: Debility  Admit Date/Time:  02/26/2016  8:56 PM Admission Comments: No comment available   Primary Diagnosis:  Debility Principal Problem: Debility  Patient Active Problem List   Diagnosis Date Noted  . Sleep disturbance   . Orthostasis   . Leukocytosis   . Hyperglycemia   . FTT (failure to thrive) in adult   . Post-operative pain   . Tracheostomy status (HCC)   . Chronic respiratory failure with hypoxia (HCC)   . Enterocutaneous fistula 02/13/2016  . Anemia of chronic disease 01/31/2016  . Stage 2 skin ulcer of sacral region 01/31/2016  . Hypomagnesemia 01/21/2016  . Hypokalemia 01/21/2016  . SBO (small bowel obstruction) 01/20/2016  . Pressure injury of skin 01/20/2016  . Tracheostomy care (HCC)   . Pleural effusion   . Gastroesophageal reflux disease 01/19/2016  . Insomnia 01/19/2016  . Aspiration pneumonia (HCC) 01/17/2016  . On total parenteral nutrition (TPN) 01/17/2016  . Metabolic acidosis 01/17/2016  . Skin ulcer of abdominal wall, limited to breakdown of skin (HCC) 01/17/2016  . Dysphagia 01/17/2016  . Debility 01/17/2016  . 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 Vitamin B12 deficiency 01/02/2016  . BPH (benign prostatic hyperplasia)   . Posterior vitreous detachment   . Salzmann's nodular dystrophy of left eye   . Hyperopia of both eyes with astigmatism and presbyopia   . Central pterygium of right eye   . Pressure ulcer of buttock, right, unstageable (HCC) 12/30/2015  . Hypernatremia 12/28/2015  . Tracheostomy in place [facility](HCC) 12/25/2015  . Tobacco abuse 12/09/2015  . Ischemic right  index finger at tip 12/09/2015  . Gastritis 12/09/2015  . Gastric tube present (LUQ) 12/09/2015  . Acute respiratory failure with hypoxemia (HCC)   . Protein-calorie malnutrition, severe 12/04/2015  . Encephalopathy acute 12/03/2015  . Pyloric stricture 11/24/2015  . Anxiety state 09/28/2015  . Hyperlipidemia   . 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 Coronary atherosclerosis of native coronary artery 04/24/2013  . Essential hypertension, benign 04/24/2013  . Other and unspecified hyperlipidemia 04/24/2013  . Central pterygium 10/30/2012  . 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 Cataract, nuclear sclerotic senile 10/30/2012    Expected Discharge Date: Expected Discharge Date: 03/17/16  Team Members Present: Physician leading conference: Dr. Maryla MorrowAnkit [name] Social Worker Present: Dossie DerBecky Sharronda Schweers, LCSW PT Present: Katherine Mantleodney Wishart, PT OT Present: Kearney HardElisabeth Doe, OT SLP Present: Jackalyn LombardNicole Page, SLP PPS Coordinator present : Edson SnowballBecky Windsor, PT     Current Status/Progress Goal Weekly Team Focus  Medical   Debility and encephalopathy secondary to small bowel obstruction and associated complications   Improve pain, sleep disturbance, SBO, anemia, orthostasis  See above   Bowel/Bladder   Incontinent B/B. LBM 02/28/16. Condom cath r/t strict I/Os. PVR TID.  Maintain regular BMs.  Offer toileting Q2H and PRN. Administer PRN meds as appropriate.   Swallow/Nutrition/ Hydration             ADL's   mod-max A, limited by symptomatic orthostatic hypotension and deconditioning  supervision-min A overall  upright tolerance, modified  bathing/dressing,  functional mobility training, generalized strengthening, activity tolerance   Mobility   Mod-Max A, A limited by orthostatic hypotension and deconditioning  Supervision-Min A overall  Upright tolerance, functional mobility training, generalized strengthening, activity tolerance    Communication   max assist   mod assist   education and carryover of intelligiiblity strategies    Safety/Cognition/ Behavioral Observations  decreased orientation and attention to tasks   mod assist   use of external aids to facilitate recall of orientaiton information and basic daily information    Pain   Back pain with 1mg  IV morphine Q3H PRN ineffective. Refuse Q4H turns, prefers supine.  Maintain acceptable pain level of 4/10.  Assess pain level Q shift and PRN. Monitor for PRN meds and alternative therapy for effectiveness.   Skin   Abdominal surgical incision with eakins pouch for drain. Change Q Mon and Thurs. Gastrostomy tube to gravity drain. LLQ JP drain to gravity. Prophylactic foam to sacrum change Q3D. Skin otherwise clean, dry and intact.  Maintain skin integrity with no new skin break down and no s/s of infection.  Assess and monitor skin integrity Q shift and PRN. Change dressings as ordered.      *See Care Plan and progress notes for long and short-term goals.  Barriers to Discharge: Pain, sleep, SBO, anemia, orthostasis, pouch, cognition    Possible Resolutions to Barriers:  Improve pain meds, optimize sleep meds, surgery following for trial trickle feeds, follow labs    Discharge Planning/Teaching Needs:  Home with wife and daughter who want to get pt home. One of them is here daily and observing in therapies.      Team Discussion:  Goals min-supervision level. Currently max level of assist. Trying tube feedings tonight.Sleep issues-MD addressing with different meds. BP issues resolved. Dizziness today with whatever he was doing. Working on activity tolerance. Family wants to take him home will depend upon his medical issues if feasible-ie ability to be tube fed and pain meds.  Revisions to Treatment Plan:  None   Continued Need for Acute Rehabilitation Level of Care: The patient requires daily medical management by a physician with specialized training in physical  medicine and rehabilitation for the following conditions: Daily direction of a multidisciplinary physical rehabilitation program to ensure safe treatment while eliciting the highest outcome that is of practical value to the patient.: Yes Daily medical management of patient stability for increased activity during participation in an intensive rehabilitation regime.: Yes Daily analysis of laboratory values and/or radiology reports with any subsequent need for medication adjustment of medical intervention for : Nutritional problems;Post surgical problems;Wound care problems;Mood/behavior problems  Lucy Chris 03/02/2016, 2:57 PM

## 2016-03-02 NOTE — Progress Notes (Signed)
[place] PHYSICAL MEDICINE & REHABILITATION     PROGRESS NOTE  Subjective/Complaints:  Pt seen laying in bed.  Pt states he slept well, however, nursing states pt did not sleep well.  Ativan made pt hallucinate. Per nursing, pt also had back pain overnight.  Pt wife requesting to speak with me, updated by phone.  ROS: Appears to deny CP, SOB, N/V/D, however, unreliable.    Objective: Vital Signs: Blood pressure (!) 128/56, pulse 86, temperature 97.9 F (36.6 C), temperature source Oral, resp. rate 17, height 6\' 2"  (1.88 m), weight 69.1 kg (152 lb 6.4 oz), SpO2 100 %. Dg Small Bowel  Result Date: 03/01/2016 CLINICAL DATA:  Intra-abdominal abscesses. Gastrojejunostomy. Percutaneous gastrostomy tube with J-tube extension. Percutaneous drainage catheter in the mid abdomen from LEFT approach EXAM: SMALL BOWEL SERIES COMPARISON:  02/19/2016 TECHNIQUE: Following ingestion of thin barium, serial small bowel images were obtained including spot views of the terminal ileum. FLUOROSCOPY TIME:  Fluoroscopy Time:  2 minutes 12 seconds Radiation Exposure Index (if provided by the fluoroscopic device): 31 mGy Number of Acquired Spot Images: 14 FINDINGS: Initial spot few of the abdomen were obtained demonstrating a gastrostomy tube which j arm extension and a LEFT lower quadrant percutaneous drainage. Hand injection of water-soluble contrast through the percutaneous gastrostomy tube which J -arm extension. Contrast flows into the distal small bowel from the jejunostomy tube. Contrast flows initially antegrade and subsequently retrograde. Retrograde Contrast enters the stomach through the gastrojejunostomy. There is a linear collection of contrast paralleling the RIGHT border of the spine. This continues to increase in density over the course of the study ultimately measuring approximately 12 cm in length and 1.2 cm in width. In comparison to most recent CT, collection represents a cutaneous fluid collection along the  ventral abdominal wall with an enteric cutaneous fistula between the gastro jejunostomy anastomosis and the ventral abdominal wound. Contrast ultimately flows distally into the ascending colon. No contrast collects in the abscess associated with the percutaneous drainage catheter. IMPRESSION: 1. 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 2. No evidence of contrast entering the percutaneous drainage catheter in the LEFT lower quadrant. 3. Contrast flows antegrade into the cecum. These results will be called to the ordering clinician or representative by the Radiologist Assistant, and communication documented in the PACS or zVision Dashboard. Electronically Signed   By: Genevive Bi M.D.   On: 03/01/2016 16:16    Recent Labs  03/01/16 0657 03/02/16 0547  WBC 10.6* 10.6*  HGB 8.3* 7.8*  HCT 26.1* 24.8*  PLT 527* 456*    Recent Labs  03/01/16 0657 03/02/16 0547  NA 135 140  K 4.3 3.8  CL 108 115*  GLUCOSE 99 89  BUN 51* 36*  CREATININE 0.73 0.59*  CALCIUM 8.5* 7.9*   CBG (last 3)   Recent Labs  02/28/16 1134 02/28/16 1908  GLUCAP 104* 105*    Wt Readings from Last 3 Encounters:  03/02/16 69.1 kg (152 lb 6.4 oz)  02/26/16 78.6 kg (173 lb 4.5 oz)  01/19/16 79.2 kg (174 lb 8 oz)    Physical Exam:  BP (!) 128/56 (BP Location: Left Arm)   Pulse 86   Temp 97.9 F (36.6 C) (Oral)   Resp 17   Ht 6\' 2"  (1.88 m)   Wt 69.1 kg (152 lb 6.4  oz)   SpO2 100%   BMI 19.57 kg/m  Constitutional: NAD. Vital signs reviewed. Frail.  HENT: Normocephalic and atraumatic.  Eyes: EOMI. No discharge.  Cardiovascular: RRR. No JVD. Respiratory: No respiratory distress. Unlabored. GI: He exhibits no distension. BS+. +Eakins pouch LUQ drain  LLQ drain  Musculoskeletal: He  exhibits no edema. He exhibits no tenderness.  Neurological: He is alert.  Motor: B/l UE grossly 4/5 prox to distal.  B/l LE: 3/5 HF, 3/5 KE and 4-/5 ADF/PF (unchanged). Skin: Skin is warm and dry. Abdominal wounds with dressing.   Assessment/Plan: 1. Functional deficits secondary to debility and encephalopathy which require 3+ hours per day of interdisciplinary therapy in a comprehensive inpatient rehab setting. Physiatrist is providing close team supervision and 24 hour management of active medical problems listed below. Physiatrist and rehab team continue to assess barriers to discharge/monitor patient progress toward functional and medical goals.  Function:  Bathing Bathing position Bathing activity did not occur: Refused Position: Bed  Bathing parts Body parts bathed by patient: Right arm, Left arm, Chest Body parts bathed by helper: Left lower leg, Right lower leg, Left upper leg, Right upper leg  Bathing assist Assist Level:  (Total A)      Upper Body Dressing/Undressing Upper body dressing Upper body dressing/undressing activity did not occur: Safety/medical concerns What is the patient wearing?: Pull over shirt/dress     Pull over shirt/dress - Perfomed by patient: Thread/unthread left sleeve Pull over shirt/dress - Perfomed by helper: Pull shirt over trunk, Put head through opening, Thread/unthread right sleeve        Upper body assist Assist Level: Touching or steadying assistance(Pt > 75%)      Lower Body Dressing/Undressing Lower body dressing Lower body dressing/undressing activity did not occur: Safety/medical concerns What is the patient wearing?: Pants, Socks       Pants- Performed by helper: Thread/unthread right pants leg, Thread/unthread left pants leg, Pull pants up/down       Socks - Performed by helper: Don/doff right sock, Don/doff left sock           TED Hose - Performed by helper: Don/doff left TED hose, Don/doff right TED hose  Lower body  assist Assist for lower body dressing:  (Max A)      Toileting Toileting Toileting activity did not occur: N/A (wears hospital gown) Toileting steps completed by patient: Performs perineal hygiene Toileting steps completed by helper: Adjust clothing prior to toileting, Adjust clothing after toileting    Toileting assist Assist level: Two helpers (per [employer], NT report)   Transfers Chair/bed transfer   Chair/bed transfer method: Stand pivot Chair/bed transfer assist level: Maximal assist (Pt 25 - 49%/lift and lower) Chair/bed transfer assistive device: Walker, Armrests, Bedrails     Locomotion Ambulation     Max distance: 12 Assist level: Moderate assist (Pt 50 - 74%)   Wheelchair   Type: Manual Max wheelchair distance: 16400ft  Assist Level: Touching or steadying assistance (Pt > 75%)  Cognition Comprehension Comprehension assist level: Understands basic 75 - 89% of the time/ requires cueing 10 - 24% of the time  Expression Expression assist level: Expresses basic 50 - 74% of the time/requires cueing 25 - 49% of the time. Needs to repeat parts of sentences.  Social Interaction Social Interaction assist level: Interacts appropriately 25 - 49% of time - Needs frequent redirection.  Problem Solving Problem solving assist level: Solves basic 25 - 49% of the time - needs direction more than half the  time to initiate, plan or complete simple activities  Memory Memory assist level: Recognizes or recalls 25 - 49% of the time/requires cueing 50 - 75% of the time    Medical Problem List and Plan: 1. Debility and encephalopathy secondary to small bowel obstruction and associated complications   Cont CIR  Surgery evaluated, plan on using J-tube for trial of trickle feeds, appreciate recs  No meds through feeding tube 2. DVT Prophylaxis/Anticoagulation: Pharmaceutical: Lovenox  3. Pain Management: Changed PCA to IV morphine every 4 hours prn severe pain  4. Mood: team to provide ego  support. LCSW to follow for evaluation and support.  5. Neuropsych: This patient is not fully capable of making decisions on his own behalf.  6. Skin/Wound Care: Continue air mattress overlay. Continue large Eakin's pouch to manage wound drainage. WOC for management. Encourage side lying to help manage MASD.  7. Malnutrition/Fluids/Electrolytes/Nutrition: continue TPN.  8. Abdominal abscess: Leucocytosis resolving. Continue rocephin until 1/17 9. Anemia of chronic illness:   Hb 7.8 on 1/17  Cont to monitor  Hemoccult ordered 10. Electrolyte abnormality/Acute renal insufficiency: Managed by TNA  11. Encephalopathy: Changed haldol to IM prn for management of agitation/anxiety.  12. Hyperglycemia  Controlled 1/17 13. Leukocytosis: Improving  Afebrile  WBCs 10.6 on 1/17  Ucx NG  CXR reviewed, improved atelectasis 14. Orthostasis  Symptomatic, due to sig output from pouch  After discussion with pharmacy, IVF initiated 15. Sleep disturbance  Has tried Haldol without benefit, ativan causes hallucinations  Will speak to pharmacy regarding alternative means of administering xanax as this has worked  LOS (Days) 5 A FACE TO FACE EVALUATION WAS PERFORMED  Felesia Stahlecker Karis Juba 03/02/2016 9:38 AM

## 2016-03-02 NOTE — Progress Notes (Signed)
Speech Language Pathology Daily Session Note  Patient Details  Name: Ardyth GalRalph T Wadleigh MRN: [id number] Date of Birth: 07-Jun-1940  Today's Date: 03/02/2016 SLP Individual Time: 1530-1600 SLP Individual Time Calculation (min): 30 min   Short Term Goals: Week 1: SLP Short Term Goal 1 (Week 1): Pt will utilize speech intelligibility strategies at the pharse level with Mod A verbal cues to achieve 75% intelligibility.  SLP Short Term Goal 2 (Week 1): Pt will complete basic, familiar tasks iwth Mod A verbal cues for functional problem solving.  SLP Short Term Goal 3 (Week 1): Pt will sustain attention to functional tasks for ~10 minutes with Mod A verbal cues.  SLP Short Term Goal 4 (Week 1): Pt will utilize external memory aids to recall new, daily information with Mod A verbal cues.  SLP Short Term Goal 5 (Week 1): Pt will consistently demonstrate O x 4 with Mod A verbal cues.   Skilled Therapeutic Interventions:   Skilled treatment session focused on addressing cognitive-linguistic goals. Patient participated in a structured card task to address sustained attention as well as speech intelligibility.  SLP facilitated session by providing Mod verbal cues for sustained attention to card task for 10 minutes x2.  Patient required no cues to accurately identify card of greatest value from a field of two, but required Max assist question cues to initiate verbal expression with strong vocal intensity.  Patient attempts for phrase level verbal expression was unintelligible due to words running together.  Continue with current plan of care.    Function:   Cognition Comprehension Comprehension assist level: Understands basic 75 - 89% of the time/ requires cueing 10 - 24% of the time  Expression   Expression assist level: Expresses basic 50 - 74% of the time/requires cueing 25 - 49% of the time. Needs to repeat parts of sentences.  Social Interaction Social Interaction assist level: Interacts  appropriately 25 - 49% of time - Needs frequent redirection.  Problem Solving Problem solving assist level: Solves basic 25 - 49% of the time - needs direction more than half the time to initiate, plan or complete simple activities  Memory Memory assist level: Recognizes or recalls 25 - 49% of the time/requires cueing 50 - 75% of the time    Pain Pain Assessment Pain Assessment: No/denies pain  Therapy/Group: Individual Therapy  Charlane FerrettiMelissa Elmon Shader, M.A., CCC-SLP [phone]  Alvia Tory 03/02/2016, 3:58 PM

## 2016-03-02 NOTE — Progress Notes (Signed)
[facility] Progress Note     Subjective: Slept poorly last night. Denies nausea or vomiting. Having increased abdominal pain s/p increased physical activity yesterday. Dizziness improved with initiation of IVF.  Abdominal drain left abdomen: 15 cc/24h Ventral abdominal drain: 450 cc/24, decreased from 575 cc Gastrostomy tube: 800 cc/24 h (increased s/p contrast UGI study)  Objective: Vital signs in last 24 hours: Temp:  [97.9 F (36.6 C)-98 F (36.7 C)] 97.9 F (36.6 C) (01/17 0510) Pulse Rate:  [83-86] 86 (01/17 0510) Resp:  [17-18] 17 (01/17 0510) BP: (106-128)/(36-56) 128/56 (01/17 0510) SpO2:  [98 %-100 %] 100 % (01/17 0510) Weight:  [69.1 kg (152 lb 6.4 oz)] 69.1 kg (152 lb 6.4 oz) (01/17 0510) Last BM Date: 02/28/16  Intake/Output from previous day: 01/16 0701 - 01/17 0700 In: 4894.9 [I.V.:4844.9; IV Piggyback:50] Out: 2465 [Urine:1450; Drains:1015] Intake/Output this shift: Total I/O In: -  Out: 600 [Drains:600]  PE: Gen:  Alert, NAD, pleasant Abd: Soft, NT/ND, Eakin pouch in place with good seal, LLQ drain site c/d/i, G-tube to gravity   Lab Results:   Recent Labs  03/01/16 0657 03/02/16 0547  WBC 10.6* 10.6*  HGB 8.3* 7.8*  HCT 26.1* 24.8*  PLT 527* 456*   BMET  Recent Labs  03/01/16 0657 03/02/16 0547  NA 135 140  K 4.3 3.8  CL 108 115*  CO2 21* 19*  GLUCOSE 99 89  BUN 51* 36*  CREATININE 0.73 0.59*  CALCIUM 8.5* 7.9*   CMP     Component Value Date/Time   NA 140 03/02/2016 0547   NA 139 01/14/2016   K 3.8 03/02/2016 0547   CL 115 (H) 03/02/2016 0547   CO2 19 (L) 03/02/2016 0547   GLUCOSE 89 03/02/2016 0547   BUN 36 (H) 03/02/2016 0547   BUN 23 (A) 01/14/2016   CREATININE 0.59 (L) 03/02/2016 0547   CALCIUM 7.9 (L) 03/02/2016 0547   PROT 6.9 02/29/2016 0605   ALBUMIN 1.6 (L) 02/29/2016 0605   AST 39 02/29/2016 0605   ALT 32 02/29/2016 0605   ALKPHOS 184 (H) 02/29/2016 0605   BILITOT 0.2 (L) 02/29/2016 0605    GFRNONAA >60 03/02/2016 0547   GFRAA >60 03/02/2016 0547   Lipase     Component Value Date/Time   LIPASE 55 (H) 01/20/2016 0158    Studies/Results: Dg Small Bowel  Result Date: 03/01/2016 CLINICAL DATA:  Intra-abdominal abscesses. Gastrojejunostomy. Percutaneous gastrostomy tube with J-tube extension. Percutaneous drainage catheter in the mid abdomen from LEFT approach EXAM: SMALL BOWEL SERIES COMPARISON:  02/19/2016 TECHNIQUE: Following ingestion of thin barium, serial small bowel images were obtained including spot views of the terminal ileum. FLUOROSCOPY TIME:  Fluoroscopy Time:  2 minutes 12 seconds Radiation Exposure Index (if provided by the fluoroscopic device): 31 mGy Number of Acquired Spot Images: 14 FINDINGS: Initial spot few of the abdomen were obtained demonstrating a gastrostomy tube which j arm extension and a LEFT lower quadrant percutaneous drainage. Hand injection of water-soluble contrast through the percutaneous gastrostomy tube which J -arm extension. Contrast flows into the distal small bowel from the jejunostomy tube. Contrast flows initially antegrade and subsequently retrograde. Retrograde Contrast enters the stomach through the gastrojejunostomy. There is a linear collection of contrast paralleling the RIGHT border of the spine. This continues to increase in density over the course of the study ultimately measuring approximately 12 cm in length and 1.2 cm in width. In comparison to most recent CT, collection represents a cutaneous fluid collection along the  ventral abdominal wall with an enteric cutaneous fistula between the gastro jejunostomy anastomosis and the ventral abdominal wound. Contrast ultimately flows distally into the ascending colon. No contrast collects in the abscess associated with the percutaneous drainage catheter. IMPRESSION: 1. Retrograde flow of contrast injected into the jejunostomy tube demonstrates an enteric cutaneous fistula between the  gastrojejunostomy anastomosis and the ventral abdominal wall with a ventral fluid collection paralleling the spine within the abdominal wall wound. 2. No evidence of contrast entering the percutaneous drainage catheter in the LEFT lower quadrant. 3. Contrast flows antegrade into the cecum. These results will be called to the ordering clinician or representative by the Radiologist Assistant, and communication documented in the PACS or zVision Dashboard. Electronically Signed   By: Genevive BiStewart  Edmunds M.D.   On: 03/01/2016 16:16    Anti-infectives: Anti-infectives    Start     Dose/Rate Route Frequency Ordered Stop   02/29/16 2000  cefTRIAXone (ROCEPHIN) 2 g in dextrose 5 % 50 mL IVPB     2 g 100 mL/hr over 30 Minutes Intravenous Every 24 hours 02/29/16 1804 03/03/16 1959   02/27/16 2000  cefTRIAXone (ROCEPHIN) 2 g in dextrose 5 % 50 mL IVPB  Status:  Discontinued     2 g 100 mL/hr over 30 Minutes Intravenous Every 24 hours 02/26/16 2137 02/29/16 1804     Assessment/Plan S/P  1. Robotic anterior and posterior vagotomy, BI anastomosis, DOR fundoplication, omentopexy for partial GOO 09/2015, DR. Viviann SpareSTEVEN GROSS  2. Feeding Jejunostomy and gastrostomy/EGD balloon dilatation, secondary to edema at anastomosis, 12/01/15, DR. Karie SodaSteven Gross 3. Diagnostic laparoscopy, omentopexy of jejunal disruption, and washout for jejunal disruption (pt pulled out jejunostomy tube)and gastric leak. 12/03/15, Dr. Karie SodaSteven Gross  4. EXPLORATORY LAPAROTOMY, LYSIS OF ADHESIONS, SEGMENTAL SMALL BOWEL RESECTION, GASTROJEJUNOSTOMY , 02/06/16, Dr. Chevis PrettyPaul Toth (for pSBO and persistent sxs GOO)  - New EC fistula midline 02/13/16/CT shows LLQ intraabdominal abscess - CT guided LLQabdominal abscess drainage, 02/14/16; IR Dr. Fredia SorrowYamagata - high drain output: order placed for 1 to 1 fluid replacements. Continue to replace fluids to prevent hypotension. -UGI w SB follow through 03/01/16: majority of contrast through j-tube progressed in an  antegrade fashion with some retrograde flow.  - discontinue IV abx tomorrow to complete a 2 weeks course  FEN: NPO/TNA  ID: Zosyn 02/06/16 thru 02/17/16; Vancomycin 02/08/16 thru 02/15/16; Rocephin started 02/17/16-03/02/16 DVT: Heparin/SCD   Plan: Blood pressures improved with IV fluid replacement. Total drain output down-trending. Increased PEG output, attributable to drain study yesterday. DG small bowel study shows mostly anterograde flow of contrast into the cecum.  Will likely start cyclic trickle tube feeds today via J-port of GJ tube (10 cc/hr). Should run from 1500-0600. Continue Gastrostomy port to gravity.    LOS: 5 days    Adam PhenixElizabeth S Esmay Amspacher , [facility] 03/02/2016, 7:53 AM Pager: [phone] Consults: [phone] Mon-Fri 7:00 am-4:30 pm Sat-Sun 7:00 am-11:30 am

## 2016-03-02 NOTE — Progress Notes (Signed)
Nurse called on call Md, Delle Reiningamela Love and informed MD of patient continuing to c/o unrelieved back pain and insomnia with scheduled and PRN medication administrations. Nurse informed MD of administering scheduled HS ativan and pt picking at gown and nurse expressed concerns of administering second dose of ativan, patient will pick and pull at lines and drains. MD stated to continue and administer the additional one time dose of ativan and PRN morphine as appropriate for the night. If pt becomes more confused and pulls at lines and drains with admin of ativan, use restraints as an alternative. Nurse will admin meds and observe for changes in conditions.

## 2016-03-02 NOTE — Plan of Care (Signed)
Problem: RH BOWEL ELIMINATION Goal: RH STG MANAGE BOWEL WITH ASSISTANCE STG Manage Bowel with min Assistance.   Outcome: Not Progressing Suppository  Goal: RH STG MANAGE BOWEL W/MEDICATION W/ASSISTANCE STG Manage Bowel with Medication with min Assistance.   Outcome: Not Progressing Suppository   Problem: RH BLADDER ELIMINATION Goal: RH STG MANAGE BLADDER WITH ASSISTANCE STG Manage Bladder With min Assistance   Outcome: Not Progressing Condom cath

## 2016-03-02 NOTE — Progress Notes (Signed)
Progress Note: General Surgery Service   Subjective: Complains of back pain, no bm yesterday  Objective: Vital signs in last 24 hours: Temp:  [97.9 F (36.6 C)-98 F (36.7 C)] 97.9 F (36.6 C) (01/17 0510) Pulse Rate:  [83-86] 86 (01/17 0510) Resp:  [17-18] 17 (01/17 0510) BP: (122-128)/(55-56) 128/56 (01/17 0510) SpO2:  [98 %-100 %] 100 % (01/17 0510) Weight:  [69.1 kg (152 lb 6.4 oz)] 69.1 kg (152 lb 6.4 oz) (01/17 0510) Last BM Date: 02/28/16  Intake/Output from previous day: 01/16 0701 - 01/17 0700 In: 4894.9 [I.V.:4844.9; IV Piggyback:50] Out: 2465 [Urine:1450; Drains:1015] Intake/Output this shift: Total I/O In: -  Out: 875 [Urine:275; Drains:600]  Lungs: CTAB  Cardiovascular: RRR  Abd: eakins in place with minimal drainage in bag, g tube with dark liquid in bag  Extremities: no edema  Neuro: AOx4, slurred speech  Lab Results: CBC   Recent Labs  03/01/16 0657 03/02/16 0547  WBC 10.6* 10.6*  HGB 8.3* 7.8*  HCT 26.1* 24.8*  PLT 527* 456*   BMET  Recent Labs  03/01/16 0657 03/02/16 0547  NA 135 140  K 4.3 3.8  CL 108 115*  CO2 21* 19*  GLUCOSE 99 89  BUN 51* 36*  CREATININE 0.73 0.59*  CALCIUM 8.5* 7.9*   PT/INR No results for input(s): LABPROT, INR in the last 72 hours. ABG No results for input(s): PHART, HCO3 in the last 72 hours.  Invalid input(s): PCO2, PO2  Studies/Results:  Anti-infectives: Anti-infectives    Start     Dose/Rate Route Frequency Ordered Stop   02/29/16 2000  cefTRIAXone (ROCEPHIN) 2 g in dextrose 5 % 50 mL IVPB     2 g 100 mL/hr over 30 Minutes Intravenous Every 24 hours 02/29/16 1804 03/03/16 1959   02/27/16 2000  cefTRIAXone (ROCEPHIN) 2 g in dextrose 5 % 50 mL IVPB  Status:  Discontinued     2 g 100 mL/hr over 30 Minutes Intravenous Every 24 hours 02/26/16 2137 02/29/16 1804      Medications: Scheduled Meds: . ALPRAZolam  0.5 mg Oral QHS  . bisacodyl  10 mg Rectal QODAY  . cefTRIAXone (ROCEPHIN)  IV   2 g Intravenous Q24H  . cycloSPORINE  1 drop Both Eyes BID  . enoxaparin (LOVENOX) injection  40 mg Subcutaneous Q24H  . lidocaine  1 patch Transdermal Q24H  . lip balm  1 application Topical BID  . liver oil-zinc oxide   Topical BID  . mouth rinse  15 mL Mouth Rinse q12n4p  . nicotine  7 mg Transdermal Daily  . sodium chloride flush  10-40 mL Intracatheter Q12H  . trolamine salicylate   Topical TID AC   Continuous Infusions: . sodium chloride 100 mL/hr at 03/02/16 0917   PRN Meds:.albuterol, diphenhydrAMINE, lidocaine, magic mouthwash, menthol-cetylpyridinium, methocarbamol (ROBAXIN)  IV, morphine, nitroGLYCERIN, phenol, polyvinyl alcohol, prochlorperazine, sodium chloride flush, sodium chloride flush, sodium phosphate  Assessment/Plan: Patient Active Problem List   Diagnosis Date Noted  . Sleep disturbance   . Orthostasis   . Leukocytosis   . Hyperglycemia   . FTT (failure to thrive) in adult   . Post-operative pain   . Tracheostomy status (HCC)   . Chronic respiratory failure with hypoxia (HCC)   . Enterocutaneous fistula 02/13/2016  . Anemia of chronic disease 01/31/2016  . Stage 2 skin ulcer of sacral region 01/31/2016  . Hypomagnesemia 01/21/2016  . Hypokalemia 01/21/2016  . SBO (small bowel obstruction) 01/20/2016  . Pressure injury of skin 01/20/2016  .  Tracheostomy care (HCC)   . Pleural effusion   . Gastroesophageal reflux disease 01/19/2016  . Insomnia 01/19/2016  . Aspiration pneumonia (HCC) 01/17/2016  . On total parenteral nutrition (TPN) 01/17/2016  . Metabolic acidosis 01/17/2016  . Skin ulcer of abdominal wall, limited to breakdown of skin (HCC) 01/17/2016  . Dysphagia 01/17/2016  . Debility 01/17/2016  . 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 Vitamin B12 deficiency 01/02/2016  . BPH (benign prostatic hyperplasia)   . Posterior vitreous detachment   . Salzmann's nodular dystrophy of left eye   . Hyperopia of both eyes with astigmatism and  presbyopia   . Central pterygium of right eye   . Pressure ulcer of buttock, right, unstageable (HCC) 12/30/2015  . Hypernatremia 12/28/2015  . Tracheostomy in place [facility](HCC) 12/25/2015  . Tobacco abuse 12/09/2015  . Ischemic right index finger at tip 12/09/2015  . Gastritis 12/09/2015  . Gastric tube present (LUQ) 12/09/2015  . Acute respiratory failure with hypoxemia (HCC)   . Protein-calorie malnutrition, severe 12/04/2015  . Encephalopathy acute 12/03/2015  . Pyloric stricture 11/24/2015  . Anxiety state 09/28/2015  . Hyperlipidemia   . 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 Coronary atherosclerosis of native coronary artery 04/24/2013  . Essential hypertension, benign 04/24/2013  . Other and unspecified hyperlipidemia 04/24/2013  . Central pterygium 10/30/2012  . 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 Cataract, nuclear sclerotic senile 10/30/2012  EC fistula. Yesterday SBFT showing leak from GJ anastomosis approximately 30cm proximal to J tube tip. -will start pm continuous tube feeds tonight ( I do expect that 20-35% of feeds will come back through eakins but this would be 40-2760ml/24h total and would not put a different strain on closure rate but could vastly improve nutrition over a few weeks time, if larger drainages occur: eakins >56100ml/24h total we may have to again withdraw enteral feeds. Given 50ml wasted tube feeds is pennies compared to cost of TPN, there is little downside) -ok for liquid medications via J portion of GJ tube, absolutely no crushed meds!!! -decrease IV fluids to pm only at same rate   LOS: 5 days   Rodman PickleLuke Aaron Kinsinger, MD Pg# [phone] [facility]

## 2016-03-02 NOTE — Progress Notes (Signed)
Occupational Therapy Session Note  Patient Details  Name: [name] MRN: [id number] Date of Birth: [birth year]  Today's Date: 03/02/2016 OT Individual Time: 1100-1200 OT Individual Time Calculation (min): 60 min     Short Term Goals: Week 1:  OT Short Term Goal 1 (Week 1): Pt will complete stand pivot transfers to toilet with mod assist OT Short Term Goal 2 (Week 1): Pt will complete bathing with mod assist at sit > stand level OT Short Term Goal 3 (Week 1): Pt will sustain attention to task for 4 mins with min cues OT Short Term Goal 4 (Week 1): Pt will complete sit > stand with mod assist to decrease burden of care with LB dressing/hygiene  Skilled Therapeutic Interventions/Progress Updates:    Pt on bed-pan after receiving suppository. Pt lethargic after getting morphine for abdominal pain, but responding to commands. Pt rolled with Mod A for dependent hygiene after small Bm. Total A to don pants in bed with focus on hip bridging. Despite lethargy, pt agreeable to bed level there-ex. Arm raises, heel slides, leg raises 4 sets of 10.  Pt stand-pivot transfer to recliner w/ Max A. Pt then participated in graded ball toss activity with focus on activity tolerance, and UB strength/coordination. Pt slightly confused during session, requesting to return to room. OT re-oriented pt and pt able to demonstrate how to call nursing staff using call bell.   Therapy Documentation Precautions:  Precautions Precautions: Fall Precaution Comments: recent LAP with mod ABD drainage, 1 drains, 1 bulb    , PCA, trach capped Restrictions Weight Bearing Restrictions: No Pain: Pain Assessment Pain Assessment: Faces Pain Score: 5  Faces Pain Scale: Hurts even more Pain Type: Acute pain Pain Location: Back Pain Orientation: Mid Pain Descriptors / Indicators: Aching Pain Onset: On-going Patients Stated Pain Goal: 3 Pain Intervention(s): Repositioned; Rn administered pain meds  See Function  Navigator for Current Functional Status.   Therapy/Group: Individual Therapy  [name] 03/02/2016, 11:12 AM

## 2016-03-02 NOTE — Progress Notes (Signed)
Physical Therapy Session Note  Patient Details  Name: [name] MRN: [id number] Date of Birth: [birth year]  Today's Date: 03/02/2016 PT Individual Time: 1435-1530 PT Individual Time Calculation (min): 55 min    Short Term Goals: Week 1:  PT Short Term Goal 1 (Week 1): Patient will perform bed mobility with min assist consistently  PT Short Term Goal 2 (Week 1): Patient will ambulate 79f with RW and min assist.  PT Short Term Goal 3 (Week 1): Patient will perform WC mobility x1510fwith supervison assist.  PT Short Term Goal 4 (Week 1): Patient will perform sit<>stand and stand pivot transfers with min assist  PT Short Term Goal 5 (Week 1): Patient will maintain standing balance with min assist x 3 minutes   Skilled Therapeutic Interventions/Progress Updates:     PT received sitting in recliner and agreeable to PT.   Gait in room with RW and mod assist x 10 ft. Gait in rehab gym x 4062fith min assist from PT and min-mod cues for improved posture and increased step width to improve stability and prevent Lateral R LOB.  Pts BP assessed following gait 110/60 asymptomatic.   WC mobility x 64f44fth supervision assist from PT with min cues for improved use of the R UE to maintain straight path and avoid obstacle on R.   Transfer training instructed by PT with mod assist from Recliner and min assist from raised chair with arm rests. Mod cues for improved positioning prior to transfer, increased anterior weight shift, and proper UE positioning.    Seated therex for LE. LAQ, reciprocal marches, hip abduction, HS curls; all completed with level 2 tband x10 BLE.. Isometric hip abduction x 10. Calf stretch in sitting with knee extension 2x 30 seconds   Patient returned too room and left sitting in WC with call bell in reach and all needs met.     Therapy Documentation Precautions:  Precautions Precautions: Fall Precaution Comments: recent LAP with mod ABD drainage, 1 drains, 1 bulb     , PCA, trach capped Restrictions Weight Bearing Restrictions: No General:   Vital Signs: Therapy Vitals Temp: 97.9 F (36.6 C) Temp Source: Oral Pulse Rate: 86 Resp: 18 BP: (!) 125/46 Patient Position (if appropriate): Sitting Oxygen Therapy SpO2: 98 % O2 Device: Not Delivered Pain:   4/10:  abdomin   See Function Navigator for Current Functional Status.   Therapy/Group: Individual Therapy  AustLorie Phenix7/2018, 3:28 PM

## 2016-03-02 NOTE — Progress Notes (Signed)
Recreational Therapy Session Note  Patient Details  Name: Danny GalRalph T [name] MRN: [id number] Date of Birth: [birth year] Today's Date: 03/02/2016   Order received, chart reviewed, and per team discussion with team, TR eval deferred at this time due to low activity tolerance. Will continue to monitor for appropriateness through team. Eletha Culbertson 03/02/2016, 8:33 AM

## 2016-03-02 NOTE — Progress Notes (Signed)
Physical Therapy Session Note  Patient Details  Name: [name] MRN: [name] Date of Birth: 1940-12-05  Today's Date: 03/02/2016 PT Individual Time: 0900-1000 PT Individual Time Calculation (min): 60 min    Short Term Goals: Week 1:  PT Short Term Goal 1 (Week 1): Patient will perform bed mobility with min assist consistently  PT Short Term Goal 2 (Week 1): Patient will ambulate 5950ft with RW and min assist.  PT Short Term Goal 3 (Week 1): Patient will perform WC mobility x17850ft with supervison assist.  PT Short Term Goal 4 (Week 1): Patient will perform sit<>stand and stand pivot transfers with min assist  PT Short Term Goal 5 (Week 1): Patient will maintain standing balance with min assist x 3 minutes   Skilled Therapeutic Interventions/Progress Updates:   Pt received supine in bed, heard yelling "Help!" from hallway; upon arrival of therapist pt reporting he wants to get up. C/o pain in back but does not rate. Therapist dons TEDs and non-skid socks totalA. Supine>sit maxA with HOB flat and bedrails. Sitting balance close S overall, however with dynamic dressing tasks pt has several slow posterior LOBs requiring modA to recover. MaxA for donning pants, modA for donning shirt to A with pulling over head and thread IV through R arm sleeve. Sit <>stand to pull up pants maxA from low bed. Stand pivot transfer minA to stand from elevated bed height, mod/maxA during pivotal steps due to posterior bias. Sitting in w/c BP 122/54 with HR 80bpm. After several minute rest break, pt performed sit <>stand to allow therapist to tighten abdominal binder. While in standing, utilized therapist's hand in front of hips to facilitate anterior weight shift; pt reports "tight" in gastroc/soleus, however able to come to neutral after several attempts and maintain midline standing balance while therapist adjusted binder. BP after standing 144/58 HR 88bpm. Pt washed face with wet washcloth with setupA. Sit <>stand  for second trial, improved anterior weight shift with less cueing required. Standing marching in place while maintaining anterior weight shift with visual cues; poor foot clearance but good weight shifting and min cues to regain balance once beginning to lean posteriorly. BP remained stable 142/52 HR 73 bpm follow standing trial. Stand pivot to return to bed modA. Sit >supine modA for LE management. Remained supine in bed at end of session, alarm intact and all needs in reach. RN alerted to pt request for medication for pain.   Therapy Documentation Precautions:  Precautions Precautions: Fall Precaution Comments: recent LAP with mod ABD drainage, 1 drains, 1 bulb    , PCA, trach capped Restrictions Weight Bearing Restrictions: No   See Function Navigator for Current Functional Status.   Therapy/Group: Individual Therapy  Vista Lawmanlizabeth J Tygielski 03/02/2016, 9:56 AM

## 2016-03-03 ENCOUNTER — Inpatient Hospital Stay (HOSPITAL_COMMUNITY): Payer: Medicare Other | Admitting: Physical Therapy

## 2016-03-03 ENCOUNTER — Inpatient Hospital Stay (HOSPITAL_COMMUNITY): Payer: Medicare Other | Admitting: Speech Pathology

## 2016-03-03 ENCOUNTER — Inpatient Hospital Stay (HOSPITAL_COMMUNITY): Payer: Medicare Other | Admitting: Occupational Therapy

## 2016-03-03 DIAGNOSIS — F329 Major depressive disorder, single episode, unspecified: Secondary | ICD-10-CM

## 2016-03-03 DIAGNOSIS — R7309 Other abnormal glucose: Secondary | ICD-10-CM

## 2016-03-03 DIAGNOSIS — F418 Other specified anxiety disorders: Secondary | ICD-10-CM

## 2016-03-03 DIAGNOSIS — F419 Anxiety disorder, unspecified: Secondary | ICD-10-CM

## 2016-03-03 DIAGNOSIS — E46 Unspecified protein-calorie malnutrition: Secondary | ICD-10-CM

## 2016-03-03 LAB — GLUCOSE, CAPILLARY
GLUCOSE-CAPILLARY: 101 mg/dL — AB (ref 65–99)
GLUCOSE-CAPILLARY: 130 mg/dL — AB (ref 65–99)
GLUCOSE-CAPILLARY: 132 mg/dL — AB (ref 65–99)
GLUCOSE-CAPILLARY: 93 mg/dL (ref 65–99)
Glucose-Capillary: 112 mg/dL — ABNORMAL HIGH (ref 65–99)
Glucose-Capillary: 128 mg/dL — ABNORMAL HIGH (ref 65–99)
Glucose-Capillary: 99 mg/dL (ref 65–99)

## 2016-03-03 LAB — COMPREHENSIVE METABOLIC PANEL
ALBUMIN: 1.6 g/dL — AB (ref 3.5–5.0)
ALK PHOS: 174 U/L — AB (ref 38–126)
ALT: 54 U/L (ref 17–63)
AST: 59 U/L — AB (ref 15–41)
Anion gap: 5 (ref 5–15)
BILIRUBIN TOTAL: 0.3 mg/dL (ref 0.3–1.2)
BUN: 30 mg/dL — AB (ref 6–20)
CALCIUM: 8.4 mg/dL — AB (ref 8.9–10.3)
CO2: 20 mmol/L — ABNORMAL LOW (ref 22–32)
CREATININE: 0.71 mg/dL (ref 0.61–1.24)
Chloride: 115 mmol/L — ABNORMAL HIGH (ref 101–111)
GFR calc Af Amer: >60 mL/min (ref >60)
GFR calc non Af Amer: >60 mL/min (ref >60)
GLUCOSE: 128 mg/dL — AB (ref 65–99)
Potassium: 3.9 mmol/L (ref 3.5–5.1)
Sodium: 140 mmol/L (ref 135–145)
TOTAL PROTEIN: 6.1 g/dL — AB (ref 6.5–8.1)

## 2016-03-03 LAB — MAGNESIUM: Magnesium: 1.7 mg/dL (ref 1.7–2.4)

## 2016-03-03 LAB — CBC
HEMATOCRIT: 25.3 % — AB (ref 39.0–52.0)
Hemoglobin: 7.9 g/dL — ABNORMAL LOW (ref 13.0–17.0)
MCH: 29 pg (ref 26.0–34.0)
MCHC: 31.2 g/dL (ref 30.0–36.0)
MCV: 93 fL (ref 78.0–100.0)
Platelets: 452 10*3/uL — ABNORMAL HIGH (ref 150–400)
RBC: 2.72 MIL/uL — ABNORMAL LOW (ref 4.22–5.81)
RDW: 16.7 % — AB (ref 11.5–15.5)
WBC: 8.2 10*3/uL (ref 4.0–10.5)

## 2016-03-03 LAB — PHOSPHORUS: Phosphorus: 3.9 mg/dL (ref 2.5–4.6)

## 2016-03-03 LAB — OCCULT BLOOD X 1 CARD TO LAB, STOOL: Fecal Occult Bld: POSITIVE — AB

## 2016-03-03 MED ORDER — CLINIMIX E/DEXTROSE (4.25/25) 4.25 % IV SOLN
INTRAVENOUS | Status: AC
  Administered 2016-03-03: 17:00:00 via INTRAVENOUS
  Filled 2016-03-03: qty 1000

## 2016-03-03 MED ORDER — FAT EMULSION 20 % IV EMUL
240.0000 mL | INTRAVENOUS | Status: AC
  Administered 2016-03-03: 240 mL via INTRAVENOUS
  Filled 2016-03-03: qty 250

## 2016-03-03 MED ORDER — ADULT MULTIVITAMIN LIQUID CH
15.0000 mL | Freq: Every day | ORAL | Status: DC
  Administered 2016-03-03 – 2016-03-11 (×8): 15 mL
  Filled 2016-03-03 (×10): qty 15

## 2016-03-03 MED ORDER — FLUOXETINE HCL 20 MG/5ML PO SOLN
10.0000 mg | Freq: Every day | ORAL | Status: DC
  Administered 2016-03-03 – 2016-03-16 (×13): 10 mg
  Filled 2016-03-03 (×17): qty 5

## 2016-03-03 NOTE — Progress Notes (Signed)
Occupational Therapy Session Note  Patient Details  Name: Ardyth GalRalph T Lemmerman MRN: [id number] Date of Birth: [birth year]  Today's Date: 03/03/2016 OT Individual Time: 1430-1450 OT Individual Time Calculation (min): 20 min    Short Term Goals: Week 1:  OT Short Term Goal 1 (Week 1): Pt will complete stand pivot transfers to toilet with mod assist OT Short Term Goal 2 (Week 1): Pt will complete bathing with mod assist at sit > stand level OT Short Term Goal 3 (Week 1): Pt will sustain attention to task for 4 mins with min cues OT Short Term Goal 4 (Week 1): Pt will complete sit > stand with mod assist to decrease burden of care with LB dressing/hygiene  Skilled Therapeutic Interventions/Progress Updates:    Treatment session with focus on activity tolerance and bed mobility.  Initially attempted to see pt, however RN present to change midline fistula.  Returned at a later time, to which pt reports his stomach was sore but that he would attempt to engage in therapy session.  Attempted bed mobility with pt requiring mod assist, upon sitting EOB pt reports dizziness and extreme light headedness.  Attempted to encourage pt to sit EOB to obtain vitals, but pt refusing and requesting to return to bed. Repositioned and notified RN of pain and dizziness.  Therapy Documentation Precautions:  Precautions Precautions: Fall Precaution Comments: NPO, gastrostomy tube, LLQ JP drain, abdominal surgical incision with pouch Restrictions Weight Bearing Restrictions: No General: General OT Amount of Missed Time: 25 Minutes Vital Signs: Therapy Vitals Temp:  (wrong vital doc) Temp Source: Oral Pulse Rate: 82 Resp: 16 BP: (!) 110/42 Patient Position (if appropriate): Lying Oxygen Therapy SpO2: 98 % O2 Device: Not Delivered Pain:  Pt with c/o pain in stomach, unrated.  RN aware.  See Function Navigator for Current Functional Status.   Therapy/Group: Individual Therapy  Rosalio LoudHOXIE, Dennie Moltz 03/03/2016,  2:58 PM
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
## 2016-03-03 NOTE — Progress Notes (Signed)
[facility] [facility] PHYSICAL MEDICINE & REHABILITATION     PROGRESS NOTE  Subjective/Complaints:  Pt seen laying in bed this AM working with PT.  He slept fairly overnight.  He states he had abdominal pain after TFs overnight.   ROS: Appears to deny CP, SOB, N/V/D, however, unreliable.    Objective: Vital Signs: Blood pressure (!) 112/42, pulse 79, temperature 98.4 F (36.9 C), temperature source Oral, resp. rate 18, height 6\' 2"  (1.88 m), weight 76 kg (167 lb 8.8 oz), SpO2 98 %. Dg Small Bowel  Result Date: 03/01/2016 CLINICAL DATA:  Intra-abdominal abscesses. Gastrojejunostomy. Percutaneous gastrostomy tube with J-tube extension. Percutaneous drainage catheter in the mid abdomen from LEFT approach EXAM: SMALL BOWEL SERIES COMPARISON:  02/19/2016 TECHNIQUE: Following ingestion of thin barium, serial small bowel images were obtained including spot views of the terminal ileum. FLUOROSCOPY TIME:  Fluoroscopy Time:  2 minutes 12 seconds Radiation Exposure Index (if provided by the fluoroscopic device): 31 mGy Number of Acquired Spot Images: 14 FINDINGS: Initial spot few of the abdomen were obtained demonstrating a gastrostomy tube which j arm extension and a LEFT lower quadrant percutaneous drainage. Hand injection of water-soluble contrast through the percutaneous gastrostomy tube which J -arm extension. Contrast flows into the distal small bowel from the jejunostomy tube. Contrast flows initially antegrade and subsequently retrograde. Retrograde Contrast enters the stomach through the gastrojejunostomy. There is a linear collection of contrast paralleling the RIGHT border of the spine. This continues to increase in density over the course of the study ultimately measuring approximately 12 cm in length and 1.2 cm in width. 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 Contrast ultimately flows distally into the ascending colon. No contrast collects in the abscess associated with the percutaneous drainage catheter. IMPRESSION: 1. 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 2. No evidence of contrast entering the percutaneous drainage catheter in the LEFT lower quadrant. 3. Contrast flows antegrade into the cecum. These results will be called to the ordering clinician or representative by the Radiologist Assistant, and communication documented in the PACS or zVision Dashboard. Electronically Signed   By: Genevive Bi M.D.   On: 03/01/2016 16:16    Recent Labs  03/02/16 0547 03/03/16 0544  WBC 10.6* 8.2  HGB 7.8* 7.9*  HCT 24.8* 25.3*  PLT 456* 452*    Recent Labs  03/02/16 0547 03/03/16 0544  NA 140 140  K 3.8 3.9  CL 115* 115*  GLUCOSE 89 128*  BUN 36* 30*  CREATININE 0.59* 0.71  CALCIUM 7.9* 8.4*   CBG (last 3)   Recent Labs  03/03/16 0012 03/03/16 0409 03/03/16 0741  GLUCAP 130* 128* 99    Wt Readings from Last 3 Encounters:  03/03/16 76 kg (167 lb 8.8 oz)  02/26/16 78.6 kg (173 lb 4.5 oz)  01/19/16 79.2 kg (174 lb 8 oz)    Physical Exam:  BP (!) 112/42 (BP Location: Left Arm)   Pulse 79   Temp 98.4 F (36.9 C) (Oral)   Resp 18   Ht 6\' 2"  (1.88 m)   Wt 76 kg (167 lb 8.8 oz)   SpO2 98%   BMI 21.51 kg/m  Constitutional: NAD. Vital  signs reviewed. Frail.  HENT: Normocephalic and atraumatic.  Eyes: EOMI. No discharge.  Cardiovascular: RRR. No JVD. Respiratory: No respiratory distress. Unlabored. GI: He exhibits no distension. BS+. +Eakins pouch LUQ drain  LLQ drain  Musculoskeletal: He exhibits no edema. He exhibits no tenderness.  Neurological: He is alert.  Motor:  B/l UE grossly 4/5 prox to distal.  B/l LE: 3/5 HF, 3/5 KE and 4-/5 ADF/PF (stable). Skin: Skin is warm and dry. Abdominal wounds with dressing.   Assessment/Plan: 1. Functional deficits secondary to debility and encephalopathy which require 3+ hours per day of interdisciplinary therapy in a comprehensive inpatient rehab setting. Physiatrist is providing close team supervision and 24 hour management of active medical problems listed below. Physiatrist and rehab team continue to assess barriers to discharge/monitor patient progress toward functional and medical goals.  Function:  Bathing Bathing position Bathing activity did not occur: Refused Position: Bed  Bathing parts Body parts bathed by patient: Right arm, Left arm, Chest Body parts bathed by helper: Left lower leg, Right lower leg, Left upper leg, Right upper leg  Bathing assist Assist Level:  (Total A)      Upper Body Dressing/Undressing Upper body dressing Upper body dressing/undressing activity did not occur: Safety/medical concerns What is the patient wearing?: Pull over shirt/dress     Pull over shirt/dress - Perfomed by patient: Thread/unthread right sleeve, Thread/unthread left sleeve Pull over shirt/dress - Perfomed by helper: Put head through opening, Pull shirt over trunk        Upper body assist Assist Level: Touching or steadying assistance(Pt > 75%)      Lower Body Dressing/Undressing Lower body dressing Lower body dressing/undressing activity did not occur: Safety/medical concerns What is the patient wearing?: Pants, Ted Hose, Non-skid slipper socks       Pants- Performed by helper: Thread/unthread right pants leg, Thread/unthread left pants leg, Pull pants up/down   Non-skid slipper socks- Performed by helper: Don/doff right sock, Don/doff left sock   Socks - Performed by helper: Don/doff right sock, Don/doff left sock           TED Hose - Performed by helper: Don/doff left TED hose, Don/doff right  TED hose  Lower body assist Assist for lower body dressing:  (Max A)      Toileting Toileting Toileting activity did not occur: N/A (wears hospital gown) Toileting steps completed by patient: Performs perineal hygiene Toileting steps completed by helper: Adjust clothing prior to toileting, Adjust clothing after toileting, Performs perineal hygiene    Toileting assist Assist level: Two helpers   Transfers Chair/bed transfer   Chair/bed transfer method: Ambulatory Chair/bed transfer assist level: Moderate assist (Pt 50 - 74%/lift or lower) Chair/bed transfer assistive device: Armrests, Patent attorneyWalker     Locomotion Ambulation     Max distance: 5440ft Assist level: Touching or steadying assistance (Pt > 75%)   Wheelchair   Type: Manual Max wheelchair distance: 90 Assist Level: Supervision or verbal cues  Cognition Comprehension Comprehension assist level: Understands basic 75 - 89% of the time/ requires cueing 10 - 24% of the time  Expression Expression assist level: Expresses basic 50 - 74% of the time/requires cueing 25 - 49% of the time. Needs to repeat parts of sentences.  Social Interaction Social Interaction assist level: Interacts appropriately 25 - 49% of time - Needs frequent redirection.  Problem Solving Problem solving assist level: Solves basic 25 - 49% of the time - needs direction more than half the time to initiate, plan or complete simple  activities  Memory Memory assist level: Recognizes or recalls 25 - 49% of the time/requires cueing 50 - 75% of the time    Medical Problem List and Plan: 1. Debility and encephalopathy secondary to small bowel obstruction and associated complications   Cont CIR  Surgery evaluated, trickle feeds per Surgery, spoke to Surgery, appreciate recs  No crushed meds through feeding tube 2. DVT Prophylaxis/Anticoagulation: Pharmaceutical: Lovenox  3. Pain Management: Changed PCA to IV morphine every 4 hours prn severe pain  4. Mood: team to  provide ego support. LCSW to follow for evaluation and support.   Prozaac started 1/18 5. Neuropsych: This patient is not fully capable of making decisions on his own behalf.  6. Skin/Wound Care: Continue air mattress overlay. Continue large Eakin's pouch to manage wound drainage. WOC for management. Encourage side lying to help manage MASD.  7. Malnutrition/Fluids/Electrolytes/Nutrition: continue TPN.  8. Abdominal abscess: Leucocytosis resolving. Completed rocephin 1/17 9. Anemia of chronic illness:   Hb 7.9 on 1/18 (stable)  Cont to monitor  Hemoccult + 10. Electrolyte abnormality/Acute renal insufficiency: Managed by TNA  11. Encephalopathy: Changed haldol to IM prn for management of agitation/anxiety.  12. Hyperglycemia  Overall controlled 1/18 13. Leukocytosis: Resolved  Afebrile  Ucx NG  CXR reviewed, improved atelectasis 14. Orthostasis  Symptomatic, due to sig output from pouch  After discussion with pharmacy, IVF initiated 15. Sleep disturbance  Has tried Haldol without benefit, ativan causes hallucinations  Xanax  LOS (Days) 6 A FACE TO FACE EVALUATION WAS PERFORMED  Rekita Miotke Karis Juba 03/03/2016 10:03 AM

## 2016-03-03 NOTE — Progress Notes (Signed)
Physical Therapy Session Note  Patient Details  Name: [name] MRN: [id number] Date of Birth: 10-06-1940  Today's Date: 03/03/2016 PT Individual Time: 0211-1735 PT Individual Time Calculation (min): 11 min  and Today's Date: 03/03/2016 PT Concurrent Time: 0906-1005 PT Concurrent Time Calculation (min): 59 min  Short Term Goals: Week 1:  PT Short Term Goal 1 (Week 1): Patient will perform bed mobility with min assist consistently  PT Short Term Goal 2 (Week 1): Patient will ambulate 82f with RW and min assist.  PT Short Term Goal 3 (Week 1): Patient will perform WC mobility [id number] supervison assist.  PT Short Term Goal 4 (Week 1): Patient will perform sit<>stand and stand pivot transfers with min assist  PT Short Term Goal 5 (Week 1): Patient will maintain standing balance with min assist x 3 minutes   Therapy Documentation Precautions:  Precautions Precautions: Fall Precaution Comments: NPO, gastrostomy tube, LLQ JP drain, abdominal surgical incision with pouch Restrictions Weight Bearing Restrictions: No   Pain: Patient denied any pain currently   Short sit to and from supine in bed mod assist Rolling to the left and right min assist with use of bedrail.  Stand pivot transfer to and from bed and wheelchair mod assist  Patient ambulated 25 feetx2 with use of RW. Patient ambulated with a step through gait pattern. Patient required min assist. Patient required verbal and tactile cues for upright posture, increased step length and RW management.   Patient stand pivot transfer to and from elevated toilet seat mod assist. Mod assist for clothing management, patient continent of bowel. Patient performed hygiene with supervision on toilet.   Patient required frequent and prolonged rest breaks throughout session. Patient returned to bed at end of session with all needs met and RN [name] present for continued care.      See Function Navigator for Current Functional  Status.   Therapy/Group: Individual and Concurrent   WIRetta Diones/18/2018, 11:31 AM

## 2016-03-03 NOTE — Progress Notes (Signed)
Speech Language Pathology Daily Session Note  Patient Details  Name: Ardyth GalRalph T Nick MRN: [id number] Date of Birth: 1940/10/24  Today's Date: 03/03/2016 SLP Individual Time: 1345-1430 SLP Individual Time Calculation (min): 45 min  Short Term Goals: Week 1: SLP Short Term Goal 1 (Week 1): Pt will utilize speech intelligibility strategies at the pharse level with Mod A verbal cues to achieve 75% intelligibility.  SLP Short Term Goal 2 (Week 1): Pt will complete basic, familiar tasks iwth Mod A verbal cues for functional problem solving.  SLP Short Term Goal 3 (Week 1): Pt will sustain attention to functional tasks for ~10 minutes with Mod A verbal cues.  SLP Short Term Goal 4 (Week 1): Pt will utilize external memory aids to recall new, daily information with Mod A verbal cues.  SLP Short Term Goal 5 (Week 1): Pt will consistently demonstrate O x 4 with Mod A verbal cues.   Skilled Therapeutic Interventions: Pt was seen for skilled ST targeting communication goals.  Pt with ropey, dry secretions upon arrival which appeared to be impacting pt comfort as well as intelligibility and vocal quality.  Thorough oral care was provided to remove a moderate amount of thick, dry secretions from throughout oral cavity including hard and soft palate.  Pt's vocal quality and intensity were greatly improved post oral care.  Discussed with RN recommendations that pt receive oral care QID, particularly while NPO.  Breath support remains decreased for adequate vocal intensity to achieve intelligibility in conversations. Suspect fatigue and pain to be impacting function due to the nature of his hospitalization as well as site and number of his lines and drains.  Discussed compensatory intelligibility strategies emphasizing slow rate and loud voice to improve functional communication.  Pt used the abovementioned strategies in a verbal picture description task with mod faded to min assist verbal cues.  Pt was left in bed  with call bell within reach.  Continue per current plan of care.       Function:  Eating Eating                 Cognition Comprehension Comprehension assist level: Understands basic 90% of the time/cues < 10% of the time  Expression   Expression assist level: Expresses basic 50 - 74% of the time/requires cueing 25 - 49% of the time. Needs to repeat parts of sentences.  Social Interaction Social Interaction assist level: Interacts appropriately 75 - 89% of the time - Needs redirection for appropriate language or to initiate interaction.  Problem Solving Problem solving assist level: Solves basic 50 - 74% of the time/requires cueing 25 - 49% of the time  Memory Memory assist level: Recognizes or recalls 25 - 49% of the time/requires cueing 50 - 75% of the time    Pain Pain Assessment Pain Assessment: No/denies pain  Therapy/Group: Individual Therapy  Yordin Rhoda, Melanee SpryNicole L 03/03/2016, 3:24 PM

## 2016-03-03 NOTE — Progress Notes (Signed)
Patient ID: [name], male   DOB: 06/18/1940, 76 y.o.   MRN: [id number]  [facility]     Subjective: Continues to complain of abdominal pain. States that it is the same as yesterday. No worse with TF during the night. Denies n/v. 2 BM's yesterday.  Objective: Vital signs in last 24 hours: Temp:  [97.9 F (36.6 C)-98.4 F (36.9 C)] 98.4 F (36.9 C) (01/18 0516) Pulse Rate:  [79-86] 79 (01/18 0516) Resp:  [18] 18 (01/18 0516) BP: (112-125)/(42-46) 112/42 (01/18 0516) SpO2:  [98 %] 98 % (01/18 0516) Weight:  [166 lb (75.3 kg)-167 lb 8.8 oz (76 kg)] 167 lb 8.8 oz (76 kg) (01/18 0516) Last BM Date: 02/28/16  Intake/Output from previous day: 01/17 0701 - 01/18 0700 In: 2158.7 [I.V.:2018; NG/GT:140.7] Out: 1819 [Urine:479; Drains:1340] Intake/Output this shift: No intake/output data recorded.  PE: Gen:  Alert, NAD, pleasant Card:  RRR, no M/G/R heard Pulm:  CTAB, no W/R/R, effort normal Abd: Soft, ND, mild left sided tenderness, +BS, eakins pouch in place with minimal drainage in bag, G-tube with small amount dark/green liquid in bag, LLQ drain with minimal serosanguinous fluid in bag  Lab Results:   Recent Labs  03/01/16 0657 03/02/16 0547  WBC 10.6* 10.6*  HGB 8.3* 7.8*  HCT 26.1* 24.8*  PLT 527* 456*   BMET  Recent Labs  03/02/16 0547 03/03/16 0544  NA 140 140  K 3.8 3.9  CL 115* 115*  CO2 19* 20*  GLUCOSE 89 128*  BUN 36* 30*  CREATININE 0.59* 0.71  CALCIUM 7.9* 8.4*   PT/INR No results for input(s): LABPROT, INR in the last 72 hours. CMP     Component Value Date/Time   NA 140 03/03/2016 0544   NA 139 01/14/2016   K 3.9 03/03/2016 0544   CL 115 (H) 03/03/2016 0544   CO2 20 (L) 03/03/2016 0544   GLUCOSE 128 (H) 03/03/2016 0544   BUN 30 (H) 03/03/2016 0544   BUN 23 (A) 01/14/2016   CREATININE 0.71 03/03/2016 0544   CALCIUM 8.4 (L) 03/03/2016 0544   PROT 6.1 (L) 03/03/2016 0544   ALBUMIN 1.6 (L) 03/03/2016 0544    AST 59 (H) 03/03/2016 0544   ALT 54 03/03/2016 0544   ALKPHOS 174 (H) 03/03/2016 0544   BILITOT 0.3 03/03/2016 0544   GFRNONAA >60 03/03/2016 0544   GFRAA >60 03/03/2016 0544   Lipase     Component Value Date/Time   LIPASE 55 (H) 01/20/2016 0158       Studies/Results: Dg Small Bowel  Result Date: 03/01/2016 CLINICAL DATA:  Intra-abdominal abscesses. Gastrojejunostomy. Percutaneous gastrostomy tube with J-tube extension. Percutaneous drainage catheter in the mid abdomen from LEFT approach EXAM: SMALL BOWEL SERIES COMPARISON:  02/19/2016 TECHNIQUE: Following ingestion of thin barium, serial small bowel images were obtained including spot views of the terminal ileum. FLUOROSCOPY TIME:  Fluoroscopy Time:  2 minutes 12 seconds Radiation Exposure Index (if provided by the fluoroscopic device): 31 mGy Number of Acquired Spot Images: 14 FINDINGS: Initial spot few of the abdomen were obtained demonstrating a gastrostomy tube which j arm extension and a LEFT lower quadrant percutaneous drainage. Hand injection of water-soluble contrast through the percutaneous gastrostomy tube which J -arm extension. Contrast flows into the distal small bowel from the jejunostomy tube. Contrast flows initially antegrade and subsequently retrograde. Retrograde Contrast enters the stomach through the gastrojejunostomy. There is a linear collection of contrast paralleling the RIGHT border of the spine. This continues to  increase in density over the course of the study ultimately measuring approximately 12 cm in length and 1.2 cm in width. 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 Contrast ultimately flows distally into the ascending colon. No contrast collects in the abscess associated with the percutaneous drainage catheter. IMPRESSION: 1.  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 2. No evidence of contrast entering the percutaneous drainage catheter in the LEFT lower quadrant. 3. Contrast flows antegrade into the cecum. These results will be called to the ordering clinician or representative by the Radiologist Assistant, and communication documented in the PACS or zVision Dashboard. Electronically Signed   By: Genevive Bi M.D.   On: 03/01/2016 16:16    Anti-infectives: Anti-infectives    Start     Dose/Rate Route Frequency Ordered Stop   02/29/16 2000  cefTRIAXone (ROCEPHIN) 2 g in dextrose 5 % 50 mL IVPB     2 g 100 mL/hr over 30 Minutes Intravenous Every 24 hours 02/29/16 1804 03/02/16 2017   02/27/16 2000  cefTRIAXone (ROCEPHIN) 2 g in dextrose 5 % 50 mL IVPB  Status:  Discontinued     2 g 100 mL/hr over 30 Minutes Intravenous Every 24 hours 02/26/16 2137 02/29/16 1804       Assessment/Plan EC fistula - SBFT 1/16 shows leak from GJ anastomosis approximately 30cm proximal to J tube tip - tolerated continuous TF last night at 73mL/hr - 525cc output from eakins pouch over last 24 hours - continue only liquid medications via J tube and no crushed meds - continue G-tube to gravity  FEN - NPO, IVF, TPN, TF at night  Plan - will confirm TF order with MD on how to slowly increase rate (?start at 8mL/hr and increase by 10 mL/hr every 4 hours to goal of 50 mL/hr)   LOS: 6 days    Edson Snowball , [facility] Surgery 03/03/2016, 8:09 AM Pager: [phone] Consults: [phone] Mon-Fri 7:00 am-4:30 pm Sat-Sun 7:00 am-11:30 am

## 2016-03-03 NOTE — Progress Notes (Signed)
[place] NOTE   Pharmacy Consult for TPN Indication: PTA continuation; bowel perforation and new EC fistula 02/13/16  Patient Measurements: Height: 6' 2"  (188 cm) Weight: 167 lb 8.8 oz (76 kg) IBW/kg (Calculated) : 82.2   Body mass index is 21.51 kg/m.  Assessment: 52 yoM with hx PUD, CAD, GERD, BPH,  s/p multiple procedures  and extended hospitalization for recurrent partial GOO 2/2 pyloric stricture on TPN for several days, discharged 11/15 with J/G tube in place on TFs, unable to tolerate and had to transition back to TPN via [facility] on 11/22, readmitted 12/6 with partial SBO.  Note: PTA TNA from [place] was as follows: Totals per day: 405 grams (1376 kcal) of dextrose, 75 grams (300 kcal) of protein and 50 grams (480 kcal) of lipid for a total of 2156 kcal/day  Of note, patient failed MBS on 12/12 and did not tolerate bolus feeds through G-tube 12/13. On 12/23, patient had ex lap, lysis of lesions, segmental small bowel resection, and Gastrojejunostomy. On 12/29, TF were found draining from midline incision with CT abd showing bowel perf at [place] sit with development of abscesses.   GI: albumin 1.6, prealbumin 21.5, LBM 1/14.  DG small bowel study on 1/16 showed retrograde flow of contrast injected into J tube demonstrating EC fistula, no evidence of contrast entering percutaneous drainage catheter in LLQ, and contrast flowing anterograde into cecum TF trialed yesterday - tolerated at 5m/hr, charted as off this morning at ~0630. Eakin's pouch with 5261mout in last 24h  Endo: CBGs contolled - randing from 83-129; SSI has been discontinued Insulin requirements in the past 24 hours: 0 Lytes: Na 140, K 3.9, Phos 3.9, Mag 1.7, CorCa ~10.3 Renal: Scr 0.71- IVF NS @ 10021mr per surgery Pulm: no issues Cards: Orthostatic hypotension - thought to be volume issue, IVF added- improving Hepatobil: AST up slightly at 59, ALT normal, alk phos 174, trigs  122 on 1/15 Neuro: patient on IV morphine for pain, scheduled alprazolam suspension ID: WBC down 10.6, urine cx pending, CXR improving atelectasis   Ceftriaxone 1/13>> 1/17  Best Practices: enoxaparin TPN Access: PICC TPN start date: PTA; [facility]  Nutritional Goals (per RD recommendations): kCal: 2100 - 2300/day  Protein: 95 - 110 g/day Fluid: > 1.8 L/day  Current Nutrition:  Jevity 1.2- start at 38m51m, increase by 10mL74mq4h if tolerating to goal rate of 50mL/49monly being run over 16h Cy74Jc TPN as below  Plan:  - Per surgery, to continue Jevity 1.2kcal today- plan is to start at 38mL/h50mnd increase by 10mL/h 82my 4h to goal rate of 50mL/hr 53m he tolerates TF as written, he would receive ~672kcal and ~31g protein) - In order to allow majority of nutrition to come from TF and continue with wean of TPN- Continue Clinimix E 4.25/25- 2.87/86TPN at 50 ml/hr x 1 hr, then 90 mL/hr x 10 hrs, then 50 mL/hr x 1 hr, then stop + cyclic 20% lipid76%ulsion at 20 ml/hr x 12hr- provides 42.5g of protein and 1500kcals (based on TF estimates above + TPN order- would provide ~2170kcal and ~73.5g protein a day, meeting ~100% and ~77% of needs, respectively) - Multivitamin to 15mL q1803mr tube - No trace elements required as patient is receiving these from TF - Monitor TPN labs Mon and Thu - Primary team managing IVF  Ayansh Feutz D. Ivyanna Sibert, PharmD, BCPS Clinical Pharmacist Pager: [phone] 7:41 AM

## 2016-03-03 NOTE — Progress Notes (Signed)
Occupational Therapy Session Note  Patient Details  Name: [name] MRN: [id number] Date of Birth: May 13, 1940  Today's Date: 03/03/2016 OT Individual Time: 8657-84691030-1115 OT Individual Time Calculation (min): 45 min    Short Term Goals: Week 1:  OT Short Term Goal 1 (Week 1): Pt will complete stand pivot transfers to toilet with mod assist OT Short Term Goal 2 (Week 1): Pt will complete bathing with mod assist at sit > stand level OT Short Term Goal 3 (Week 1): Pt will sustain attention to task for 4 mins with min cues OT Short Term Goal 4 (Week 1): Pt will complete sit > stand with mod assist to decrease burden of care with LB dressing/hygiene  Skilled Therapeutic Interventions/Progress Updates:    Pt seen for OT session addressing functional standing balance and endurance. Pt in supine upon arrival, requiring increased time and encouragement for participation. RN present administering pain medication, pt with complints of abdominal pain- not rated.  He transferred to EOB requiring verbal and tactile cuing throughout for sequencing.  Stand pivots completed throughout session, initially requiring mod A to stand, progressing to min A at end of session. He self propelled w/c towards therapy gym with min A and rest breaks required. Pt completed standing cognitive task, matching playing cards by color and shape He was required to reach into various planes to obtain cards addressing dynamic standing balance. . Completed x3 trials, pt tolerating ~1 minute, ~30 seconds, and ~2 minutes for respective trials with seated rest breaks btwn trials.  Pt returned to room at end of session, despite encouragement pt wanting to return to bed. Mod A required for management of LEs and positioning once in supine. Pt left in supine, all needs in reach.   Therapy Documentation Precautions:  Precautions Precautions: Fall Precaution Comments: NPO, gastrostomy tube, LLQ JP drain, abdominal surgical incision with  pouch Restrictions Weight Bearing Restrictions: No  See Function Navigator for Current Functional Status.   Therapy/Group: Individual Therapy  Lewis, Sanjana Folz C 03/03/2016, 12:13 PM

## 2016-03-04 ENCOUNTER — Inpatient Hospital Stay (HOSPITAL_COMMUNITY): Payer: Medicare Other | Admitting: Occupational Therapy

## 2016-03-04 ENCOUNTER — Inpatient Hospital Stay (HOSPITAL_COMMUNITY): Payer: Medicare Other | Admitting: Speech Pathology

## 2016-03-04 ENCOUNTER — Inpatient Hospital Stay (HOSPITAL_COMMUNITY): Payer: Medicare Other | Admitting: Physical Therapy

## 2016-03-04 LAB — GLUCOSE, CAPILLARY
GLUCOSE-CAPILLARY: 137 mg/dL — AB (ref 65–99)
Glucose-Capillary: 125 mg/dL — ABNORMAL HIGH (ref 65–99)
Glucose-Capillary: 95 mg/dL (ref 65–99)
Glucose-Capillary: 88 mg/dL (ref 65–99)
Glucose-Capillary: 205 mg/dL — ABNORMAL HIGH (ref 65–99)

## 2016-03-04 MED ORDER — FAT EMULSION 20 % IV EMUL
240.0000 mL | INTRAVENOUS | Status: AC
  Administered 2016-03-04: 240 mL via INTRAVENOUS
  Filled 2016-03-04: qty 250

## 2016-03-04 MED ORDER — CLINIMIX E/DEXTROSE (4.25/25) 4.25 % IV SOLN
INTRAVENOUS | Status: AC
  Administered 2016-03-04: 18:00:00 via INTRAVENOUS
  Filled 2016-03-04: qty 2000

## 2016-03-04 NOTE — Progress Notes (Signed)
Occupational Therapy Session Note  Patient Details  Name: [name] MRN: [id number] Date of Birth: 1940-05-17  Today's Date: 03/04/2016 OT Individual Time: 0930-1030 OT Individual Time Calculation (min): 60 min    Short Term Goals: Week 1:  OT Short Term Goal 1 (Week 1): Pt will complete stand pivot transfers to toilet with mod assist OT Short Term Goal 2 (Week 1): Pt will complete bathing with mod assist at sit > stand level OT Short Term Goal 3 (Week 1): Pt will sustain attention to task for 4 mins with min cues OT Short Term Goal 4 (Week 1): Pt will complete sit > stand with mod assist to decrease burden of care with LB dressing/hygiene      Skilled Therapeutic Interventions/Progress Updates:    Pt was already dressed for the day. Declined bathing but agreeable to exercise for activity tolerance and balance. Pt worked on sit to stand with min A with cues for foot and hand placement.  Tolerated standing for only 20 sec prior to dizziness and decrease in blood pressure (pt had on binder and thigh high TEDs).  Rested and attempted again for only 20 sec again. UE AROM with arm circles. LE AROM with alternating "swimming" kicks. Chair push ups 5x, 3 sets. Pt tolerated this exercise quite well and it facilitates movement and strength needed for smooth sit to stand. Pt very fatigued, requesting to get back in bed. Used RW with stand pivot with min A.  Pt adjusted in bed with all needs met. Bed alarm set.    Therapy Documentation Precautions:  Precautions Precautions: Fall Precaution Comments: NPO, gastrostomy tube, LLQ JP drain, abdominal surgical incision with pouch Restrictions Weight Bearing Restrictions: No     Pain: Pain Assessment Pain Assessment: No/denies pain ADL:  See Function Navigator for Current Functional Status.   Therapy/Group: Individual Therapy  Tyresha Fede 03/04/2016, 10:18 AM

## 2016-03-04 NOTE — Progress Notes (Signed)
Physical Therapy Weekly Progress Note  Patient Details  Name: [name] MRN: [id number] Date of Birth: 08-06-1940  Beginning of progress report period: February 27, 2016 End of progress report period: March 04, 2016  Today's Date: 03/04/2016 PT Individual Time: 0800-0902 PT Individual Time Calculation (min): 62 min   Patient has met 2 of 5 short term goals. Pt's barriers to reaching goals are persistent dizziness and general muscular weakness/reduced endurance. Pt is progressing towards goals due to increasing activity tolerance in sitting and standing.     Patient continues to demonstrate the following deficits muscle weakness, dizziness,  decreased endurance, decreased sitting balance, decreased standing balance and decreased balance strategies and therefore will continue to benefit from skilled PT intervention to increase functional independence with mobility.  Patient progressing toward long term goals..  Continue plan of care.  PT Short Term Goals Week 1:  PT Short Term Goal 1 (Week 1): Patient will perform bed mobility with min assist consistently  PT Short Term Goal 1 - Progress (Week 1): Met PT Short Term Goal 2 (Week 1): Patient will ambulate 1f with RW and min assist.  PT Short Term Goal 2 - Progress (Week 1): Progressing toward goal PT Short Term Goal 3 (Week 1): Patient will perform WC mobility [id number] supervison assist.  PT Short Term Goal 3 - Progress (Week 1): Progressing toward goal PT Short Term Goal 4 (Week 1): Patient will perform sit<>stand and stand pivot transfers with min assist  PT Short Term Goal 4 - Progress (Week 1): Met PT Short Term Goal 5 (Week 1): Patient will maintain standing balance with min assist x 3 minutes  PT Short Term Goal 5 - Progress (Week 1): Progressing toward goal Week 2:  PT Short Term Goal 1 (Week 2): Patient will perform bed mobility with supervision consistently PT Short Term Goal 2 (Week 2): Patient will perform  sit-to-stand and stand pivot transfers using RW with min A and minimal cues   PT Short Term Goal 3 (Week 2): Patient will ambulate 75 ft with RW and min A PT Short Term Goal 4 (Week 2): Patient will maintain standing balance with min A for 3 minutes PT Short Term Goal 5 (Week 2): Patient will have initiated stairs training   Skilled Therapeutic Interventions/Progress Updates:  Pt was found in bed. Pt had thigh high stockings and non-slip socks donned dependently by therapy.   Pt transferred from supine-to-sit at EOB min A with handrails. Pt performed sit-to-stand min A with RW. Pt required CGA with ambulating 12 ft to restroom and min assist with stand pivot transfer on elevated commode. Pt required total assistance with dropping and donning briefs and pants during toileting.   Pt performed hygiene with supervision on commode. Sit-to-stand transfer from elevated commode and tolerated 2 min of standing with CGA and with RW and grab bars to allow additional cleaning and donning of new brief. Pants were donned dependently before ambulating 8 ft CGA with RW from commode to sit up in W/C.   Pt performed two more bouts of gait of 25 ft each with RW and CGA in the hallway. Pt then self-propelled 50 ft back to room in W/C with BUE method and supervision. Sit-to-stand transfers continued to require CGA and use of handrails and RW.   Pt complained of mild dizziness throughout treatment no worse than 7/10 that improved as therapy progressed. Pt was left in room in W/C with all needs within reach.  Therapy Documentation Precautions:  Precautions Precautions: Fall Precaution Comments: NPO, gastrostomy tube, LLQ JP drain, abdominal surgical incision with pouch Restrictions Weight Bearing Restrictions: No General:   See Function Navigator for Current Functional Status.  Therapy/Group: Individual Therapy  [name] 03/04/2016, 7:51 AM

## 2016-03-04 NOTE — Progress Notes (Signed)
Social Work Patient ID: [name], male   DOB: [birth year], 76 y.o.   MRN: [id number]  Met with pt-who was sleeping, wife and daughter to update on team conference goals-min assist level and target discharge date Of 2/1. Both are pleased he is sleeping and able to participate in therapies. The tube feedings are working so far so good. Either wife or daughter is here daily to provide support. Will continue to work on discharge plans And provide support.

## 2016-03-04 NOTE — Progress Notes (Signed)
Occupational Therapy Weekly Progress Note  Patient Details  Name: [name] MRN: [id number] Date of Birth: Jan 17, 1941  Beginning of progress report period: February 27, 2016 End of progress report period: March 05, 2015  Today's Date: 03/04/2016 OT Individual Time: 1445-1530 OT Individual Time Calculation (min): 45 min    Patient has met 3 of 4 short term goals. Pt has been inconsistent with ability to participate in ADL tasks 2/2 previous orthostatic hypotension, pain, and fatigue. He is able to complete transfers with use of the RW and overall mod assist.  He continues to be limited by low activity tolerance and abdominal pain.  Will continue with current OT treatment POC.    Patient continues to demonstrate the following deficits: muscle weakness, decreased endurance and decreased standing balance, decreased postural control and decreased balance strategies and therefore will continue to benefit from skilled OT intervention to enhance overall performance with BADL and Reduce care partner burden.  Patient progressing toward long term goals..  Continue plan of care.  OT Short Term Goals Week 1:  OT Short Term Goal 1 (Week 1): Pt will complete stand pivot transfers to toilet with mod assist OT Short Term Goal 1 - Progress (Week 1): Met OT Short Term Goal 2 (Week 1): Pt will complete bathing with mod assist at sit > stand level OT Short Term Goal 2 - Progress (Week 1): Partly met OT Short Term Goal 3 (Week 1): Pt will sustain attention to task for 4 mins with min cues OT Short Term Goal 3 - Progress (Week 1): Met OT Short Term Goal 4 (Week 1): Pt will complete sit > stand with mod assist to decrease burden of care with LB dressing/hygiene OT Short Term Goal 4 - Progress (Week 1): Met Week 2:  OT Short Term Goal 1 (Week 2): Pt will complete bathing with mod assist at sit > stand level OT Short Term Goal 2 (Week 2): Pt will tolerate 5 minutes of standing activity in preparation for  ADL tasks OT Short Term Goal 3 (Week 2): Pt will complete LB dressing with Mod A  Skilled Therapeutic Interventions/Progress Updates:    1:1 OT session focused on activity tolerance for ADL tasks and transfers. Pt asleep in bed, and required encouragement to participate in OT initially. Pt agreeable to get up to the sink to shave. Stand-pivot transfer with overall Mod A with verbal and tactile cues for anterior weight shift. Grooming task completed in wc at the sink with multiple rest breaks, increased time, and Min A for thoroughness. Pt then transferred to recliner in same fashion as above and was left with needs met.   Therapy Documentation Precautions:  Precautions Precautions: Fall Precaution Comments: NPO, gastrostomy tube, LLQ JP drain, abdominal surgical incision with pouch Restrictions Weight Bearing Restrictions: No Pain: Pain Assessment Pain Assessment: No/denies pain  See Function Navigator for Current Functional Status.   Therapy/Group: Individual Therapy  Valma Cava 03/04/2016, 12:56 PM

## 2016-03-04 NOTE — Progress Notes (Signed)
Speech Language Pathology Daily Session Note  Patient Details  Name: [name] MRN: [id number] Date of Birth: [birth year]  Today's Date: 03/04/2016 SLP Individual Time: 1033-1100 SLP Individual Time Calculation (min): 27 min  Short Term Goals: Week 1: SLP Short Term Goal 1 (Week 1): Pt will utilize speech intelligibility strategies at the pharse level with Mod A verbal cues to achieve 75% intelligibility.  SLP Short Term Goal 2 (Week 1): Pt will complete basic, familiar tasks iwth Mod A verbal cues for functional problem solving.  SLP Short Term Goal 3 (Week 1): Pt will sustain attention to functional tasks for ~10 minutes with Mod A verbal cues.  SLP Short Term Goal 4 (Week 1): Pt will utilize external memory aids to recall new, daily information with Mod A verbal cues.  SLP Short Term Goal 5 (Week 1): Pt will consistently demonstrate O x 4 with Mod A verbal cues.   Skilled Therapeutic Interventions:  Pt was seen for skilled ST targeting cognitive-linguistic goals.  Pt very lethargic upon arrival but agreeable to participating in bedside therapies.  SLP introduced the incentive spirometer to educate pt on diaphragmatic breathing techniques with visual feedback to improve breath support for speech.  Pt returned demonstration of device use x10 at 500 mL with min-mod assist verbal cues.  Pt needed min assist verbal cues to slow rate and increase vocal intensity to achieve intelligibility in phrases and sentences.  He also needed min question cues to reorient to exact date but was otherwise oriented to place and situation with mod I.  A calendar was left in pt's room to facilitate carryover of temporal orientation information in between therapy sessions.  Pt left in bed with call bell within reach.  Continue per current plan of care.       Function:  Eating Eating                 Cognition Comprehension Comprehension assist level: Understands basic 90% of the time/cues < 10% of the  time  Expression   Expression assist level: Expresses basic 50 - 74% of the time/requires cueing 25 - 49% of the time. Needs to repeat parts of sentences.  Social Interaction Social Interaction assist level: Interacts appropriately 75 - 89% of the time - Needs redirection for appropriate language or to initiate interaction.  Problem Solving Problem solving assist level: Solves basic 50 - 74% of the time/requires cueing 25 - 49% of the time  Memory Memory assist level: Recognizes or recalls 25 - 49% of the time/requires cueing 50 - 75% of the time    Pain Pain Assessment Pain Assessment: No/denies pain  Therapy/Group: Individual Therapy  Jenelle Drennon, Melanee SpryNicole L 03/04/2016, 12:24 PM

## 2016-03-04 NOTE — Progress Notes (Addendum)
[place] NOTE   Pharmacy Consult for TPN Indication: PTA continuation; bowel perforation and new EC fistula 02/13/16  Patient Measurements: Height: _0  (188 cm) Weight: 166 lb 14.2 oz (75.7 kg) IBW/kg (Calculated) : 82.2   Body mass index is 21.43 kg/m.  Assessment: 5 yoM with hx PUD, CAD, GERD, BPH,  s/p multiple procedures  and extended hospitalization for recurrent partial GOO 2/2 pyloric stricture on TPN for several days, discharged 11/15 with J/G tube in place on TFs, unable to tolerate and had to transition back to TPN via [facility] on 11/22, readmitted 12/6 with partial SBO.  Note: PTA TNA from [place] was as follows: Totals per day: 405 grams (1376 kcal) of dextrose, 75 grams (300 kcal) of protein and 50 grams (480 kcal) of lipid for a total of 2156 kcal/day  Of note, patient failed MBS on 12/12 and did not tolerate bolus feeds through G-tube 12/13. On 12/23, patient had ex lap, lysis of lesions, segmental small bowel resection, and Gastrojejunostomy. On 12/29, TF were found draining from midline incision with CT abd showing bowel perf at [place] sit with development of abscesses.   GI: albumin 1.6, prealbumin 21.5, LBM 1/14.  DG small bowel study on 1/16 showed retrograde flow of contrast injected into J tube demonstrating EC fistula, no evidence of contrast entering percutaneous drainage catheter in LLQ, and contrast flowing anterograde into cecum TF never increased past 10 mL/hr, now on hold due to abdominal pain and increased output. LBM 1/18. Eakin's pouch with 169m out in last 24h - much decreased overnight with trickle rate of 135mhr. Per surgical team - trickle only over weekend then retry to advance on Monday.  Endo: CBGs contolled - randing from 83-129; SSI has been discontinued Insulin requirements in the past 24 hours: 0 Lytes: Na 140, K 3.9, Phos 3.9, Mag 1.7, CorCa ~10.3 Renal: Scr 0.71 on 1/18- IVF per surgery. Currently prn  only- received 10hrs at 10055mr 1/17-1/18.  Pulm: no issues Cards: Orthostatic hypotension - thought to be volume issue, IVF added- improving Hepatobil: AST up slightly at 59, ALT normal, alk phos 174, trigs 122 on 1/15 Neuro: patient on low dose IV morphine for pain, scheduled alprazolam suspension ID: WBC down to wnl, urine cx pending, CXR improving atelectasis   Ceftriaxone 1/13>> 1/17  Best Practices: enoxaparin TPN Access: PICC TPN start date: PTA; [facility]  Nutritional Goals (per RD recommendations): kCal: 2100 - 2300/day  Protein: 95 - 110 g/day Fluid: > 1.8 L/day  Current Nutrition:  Jevity 1.2- started at 41m47m without advancement for now Cyclic TPN as below  Plan:  - Per surgery, to continue Jevity 1.2kcal today- plan to continue 10 mL/hr through weekend and retry advancement on Monday (tolerating 41mL30mso he would receive ~288kcal and ~13g protein) -Due to TF not advancing, Increase Clinimix E 4.25/7.85/88yclic TPN at 50 ml/hr x 1 hr, then 190 mL/hr x 10 hrs, then 50 mL/hr x 1 hr, then stop + cyclic 20% l50%d emulsion at 20 ml/hr x 12hr- provides 85g of protein and 2040kcals (based on TF estimates above + TPN order- would provide ~2328kcal and ~98g protein a day, meeting ~100% of needs) - Multivitamin to 15mL 20m0 per tube - No trace elements required as patient is receiving these from TF  - Monitor TPN labs Mon and Thu - Primary team managing IVF  JessicSloan LeitermD, BCPS Clinical Pharmacist Clinical Phone 03/04/2016 until 3:30 PM, #  [phone] After hours, please call #[phone] 03/04/2016 7:26 AM

## 2016-03-04 NOTE — Progress Notes (Signed)
Nutrition Follow-up  DOCUMENTATION CODES:   Non-severe (moderate) malnutrition in context of chronic illness  INTERVENTION:  Continue TPN per Pharmacy.  Continue Jevity 1.2 formula via J-tube at trickle feeds at 10 ml/hr x 16 hours which provides 192 kcal, 13 grams of protein, and 130 ml of free water.   Once pt able to advance past trickle feeds, recommend switching to Jevity 1.5 formula and advance by 10 ml every 4-6 hours or as tolerated to goal rate of 65 ml/hr x 20 hours (may be off TF for up to 4 hours for therapy) with 30 ml Prostat BID to provide 2150 kcal, 113 grams of protein, 988 ml of free water.   RD to continue to monitor.   NUTRITION DIAGNOSIS:   Inadequate oral intake related to altered GI function (Enteric fistula) as evidenced by NPO status (TPN dependant); ongoing  GOAL:   Patient will meet greater than or equal to 90% of their needs; met  MONITOR:   Labs, Weight trends, Skin, I & O's (TPN tolerance)  REASON FOR ASSESSMENT:   Malnutrition Screening Tool    ASSESSMENT:   76 y/o mle w/ complex PMHx including COPD, GERD, CAD, PUD. Underwent gastrectomy in August due to stricture w/ post op complications requiring jejunostomy. He was most recently admitted for SBO and enteric fistula. He is on bowel rest, TPN dependent.   Pt has been tolerating his trickle tube feeds at 10 ml/hr. Per surgery MD, plan to continue with trickle tube feeds over the weekend and may increase tube feed rate next week. RN denies tube feeds being expressed through EC fistula.   TPN and trickle feeds of TF has been infusing nocturnally. Per Pharmacy note, plans to increase Clinimix E 6.65/99 to cyclic TPN at 50 ml/hr x 1 hr, then 190 mL/hr x 10 hrs, then 50 mL/hr x 1 hr, then stop + cyclic 35% lipid emulsion at 20 ml/hr x 12hr- which provides 2040 kcal and 85 grams of protein.   TPN and trickle TF will provide 2232 kcal (100% of needs) and 98 grams of protein.   Recommendations for  advancing tube feeds have been stated above.  RD to continue to monitor.  Diet Order:  Diet NPO time specified Except for: Ice Chips TPN (CLINIMIX-E) Adult  Skin:  Wound (see comment) (Midline wound with EC fistula)  Last BM:  1/19  Height:   Ht Readings from Last 1 Encounters:  02/27/16 6' 2"  (1.88 m)    Weight:   Wt Readings from Last 1 Encounters:  03/04/16 166 lb 14.2 oz (75.7 kg)    Ideal Body Weight:  86.36 kg  BMI:  Body mass index is 21.43 kg/m.  Estimated Nutritional Needs:   Kcal:  2100-2300 (29-32 kcal/kg bw)  Protein:  95-110 g (1.3-1.5 g/kg bw)  Fluid:  >1.8 L (25 ml/kg bw)  EDUCATION NEEDS:   No education needs identified at this time  Corrin Parker, MS, RD, LDN Pager # [phone] After hours/ weekend pager # [phone]

## 2016-03-04 NOTE — Progress Notes (Signed)
[place] PHYSICAL MEDICINE & REHABILITATION     PROGRESS NOTE  Subjective/Complaints: Patient seen getting PT transfer assistance onto the commode.  ROS: Appears to deny CP, SOB, N/V/D, however, unreliable.    Objective: Vital Signs: Blood pressure (!) 120/46, pulse 76, temperature 98.2 F (36.8 C), temperature source Oral, resp. rate 18, height 6\' 2"  (1.88 m), weight 75.7 kg (166 lb 14.2 oz), SpO2 98 %. No results found.  Recent Labs  03/02/16 0547 03/03/16 0544  WBC 10.6* 8.2  HGB 7.8* 7.9*  HCT 24.8* 25.3*  PLT 456* 452*    Recent Labs  03/02/16 0547 03/03/16 0544  NA 140 140  K 3.8 3.9  CL 115* 115*  GLUCOSE 89 128*  BUN 36* 30*  CREATININE 0.59* 0.71  CALCIUM 7.9* 8.4*   CBG (last 3)   Recent Labs  03/03/16 2357 03/04/16 0437 03/04/16 1200  GLUCAP 112* 125* 95    Wt Readings from Last 3 Encounters:  03/04/16 75.7 kg (166 lb 14.2 oz)  02/26/16 78.6 kg (173 lb 4.5 oz)  01/19/16 79.2 kg (174 lb 8 oz)    Physical Exam:  BP (!) 120/46 (BP Location: Left Arm)   Pulse 76   Temp 98.2 F (36.8 C) (Oral)   Resp 18   Ht 6\' 2"  (1.88 m)   Wt 75.7 kg (166 lb 14.2 oz)   SpO2 98%   BMI 21.43 kg/m  Constitutional: NAD. Vital signs reviewed. Frail.  HENT: Normocephalic and atraumatic.  Eyes: EOMI. No discharge.  Cardiovascular: RRR. No JVD. Respiratory: No respiratory distress. Unlabored.Rales or rhonchi GI: He exhibits no distension. BS+. +Eakins pouch LUQ drain  LLQ drain  Musculoskeletal: He exhibits no edema. He exhibits no tenderness.  Neurological: He is alert.  Sitting balance is good Speech with dysarthria, poor breath support Skin: Skin is warm and dry. Abdominal wounds with dressing.   Assessment/Plan: 1. Functional deficits secondary to debility and encephalopathy which require 3+ hours per day of interdisciplinary therapy in a comprehensive inpatient rehab setting. Physiatrist is providing close team supervision and 24 hour management of  active medical problems listed below. Physiatrist and rehab team continue to assess barriers to discharge/monitor patient progress toward functional and medical goals.  Function:  Bathing Bathing position Bathing activity did not occur: Refused Position: Bed  Bathing parts Body parts bathed by patient: Right arm, Left arm, Chest Body parts bathed by helper: Left lower leg, Right lower leg, Left upper leg, Right upper leg  Bathing assist Assist Level:  (Total A)      Upper Body Dressing/Undressing Upper body dressing Upper body dressing/undressing activity did not occur: Safety/medical concerns What is the patient wearing?: Pull over shirt/dress     Pull over shirt/dress - Perfomed by patient: Thread/unthread right sleeve, Thread/unthread left sleeve Pull over shirt/dress - Perfomed by helper: Put head through opening, Pull shirt over trunk        Upper body assist Assist Level: Touching or steadying assistance(Pt > 75%)      Lower Body Dressing/Undressing Lower body dressing Lower body dressing/undressing activity did not occur: Safety/medical concerns What is the patient wearing?: Pants, Ted Hose, Non-skid slipper socks       Pants- Performed by helper: Thread/unthread right pants leg, Thread/unthread left pants leg, Pull pants up/down   Non-skid slipper socks- Performed by helper: Don/doff right sock, Don/doff left sock   Socks - Performed by helper: Don/doff right sock, Don/doff left sock  TED Hose - Performed by helper: Don/doff left TED hose, Don/doff right TED hose  Lower body assist Assist for lower body dressing:  (Max A)      Toileting Toileting Toileting activity did not occur: N/A (wears hospital gown) Toileting steps completed by patient: Performs perineal hygiene Toileting steps completed by helper: Adjust clothing prior to toileting, Performs perineal hygiene, Adjust clothing after toileting    Toileting assist Assist level: Two helpers    Transfers Chair/bed transfer   Chair/bed transfer method: Ambulatory Chair/bed transfer assist level: Moderate assist (Pt 50 - 74%/lift or lower) Chair/bed transfer assistive device: Armrests, Patent attorneyWalker     Locomotion Ambulation     Max distance: 6940ft Assist level: Touching or steadying assistance (Pt > 75%)   Wheelchair   Type: Manual Max wheelchair distance: 90 Assist Level: Supervision or verbal cues  Cognition Comprehension Comprehension assist level: Understands basic 90% of the time/cues < 10% of the time  Expression Expression assist level: Expresses basic 50 - 74% of the time/requires cueing 25 - 49% of the time. Needs to repeat parts of sentences.  Social Interaction Social Interaction assist level: Interacts appropriately 75 - 89% of the time - Needs redirection for appropriate language or to initiate interaction.  Problem Solving Problem solving assist level: Solves basic 50 - 74% of the time/requires cueing 25 - 49% of the time  Memory Memory assist level: Recognizes or recalls 25 - 49% of the time/requires cueing 50 - 75% of the time    Medical Problem List and Plan: 1. Debility and encephalopathy secondary to small bowel obstruction and associated complications   Cont CIR PT, OT, speech therapy  Surgery evaluated, trickle feeds per Surgery,  No crushed meds through feeding tube 2. DVT Prophylaxis/Anticoagulation: Pharmaceutical: Lovenox  3. Pain Management: Changed PCA to IV morphine every 4 hours prn severe pain  4. Mood: team to provide ego support. LCSW to follow for evaluation and support.   Prozac started 1/18 5. Neuropsych: This patient is not fully capable of making decisions on his own behalf.  6. Skin/Wound Care: Continue air mattress overlay. Continue large Eakin's pouch to manage wound drainage. WOC for management. Encourage side lying to help manage MASD.  7. Malnutrition/Fluids/Electrolytes/Nutrition: continue TPN.  8. Abdominal abscess: Leucocytosis  resolving. Completed rocephin 1/17 9. Anemia of chronic illness:   Hb 7.9 on 1/18 (stable)  Cont to monitor  Hemoccult + 10. Electrolyte abnormality/Acute renal insufficiency: Managed by TNA  11. Encephalopathy: Changed haldol to IM prn for management of agitation/anxiety.  12. Hyperglycemia  Overall controlled 1/18 13. Leukocytosis: Resolved, WBC 8.2 on 03/03/2016  Afebrile  Ucx NG   14. Orthostasis, will order orthostatic vital signs daily  Symptomatic, due to sig output from pouch  15. Sleep disturbance  Has tried Haldol without benefit, ativan causes hallucinations  Xanax, point 5 mg via tube daily at bedtime, may repeat times 1  LOS (Days) 7 A FACE TO FACE EVALUATION WAS PERFORMED  Claudette LawsKIRSTEINS,ANDREW E 03/04/2016 12:08 PM

## 2016-03-04 NOTE — Progress Notes (Signed)
[facility] Progress Note     Subjective: Patient is sitting on the toilet having a bowel movement. He reports abdominal pain is improved compared to yesterday. Denies nausea or vomiting.   Per nurse, pt had some abdominal pain overnight and did not sleep well. Patient was able to rest and pain improved after Xanax. Nurse denies noticing any tube feeds being expressed through EC fistula.  Objective: Vital signs in last 24 hours: Temp:  [97.9 F (36.6 C)-98.2 F (36.8 C)] 98.2 F (36.8 C) (01/19 0441) Pulse Rate:  [76-82] 76 (01/19 0441) Resp:  [16-18] 18 (01/19 0441) BP: (110-120)/(42-46) 120/46 (01/19 0441) SpO2:  [98 %] 98 % (01/19 0441) Weight:  [75.7 kg (166 lb 14.2 oz)] 75.7 kg (166 lb 14.2 oz) (01/19 0441) Last BM Date: 03/03/16  Intake/Output from previous day: 01/18 0701 - 01/19 0700 In: 10  Out: 1945 [Urine:1125; Drains:820] Intake/Output this shift: No intake/output data recorded.  PE: Gen:  Alert, NAD, pleasant Card:  RRR, no M/G/R heard Pulm:  CTAB, no W/R/R, effort normal Abd: Abdominal binder in place, ND, eakins pouch in place with minimal drainage in bag, LLQ drain with minimal serosanguinous fluid in bag  Left bulb drain: 20 cc/24h serosanguinous  EC fistuala/eakin pouch: 150 cc/24h  Gastrostomy tube: 650 cc/24h  Lab Results:   Recent Labs  03/02/16 0547 03/03/16 0544  WBC 10.6* 8.2  HGB 7.8* 7.9*  HCT 24.8* 25.3*  PLT 456* 452*   BMET  Recent Labs  03/02/16 0547 03/03/16 0544  NA 140 140  K 3.8 3.9  CL 115* 115*  CO2 19* 20*  GLUCOSE 89 128*  BUN 36* 30*  CREATININE 0.59* 0.71  CALCIUM 7.9* 8.4*   CMP     Component Value Date/Time   NA 140 03/03/2016 0544   NA 139 01/14/2016   K 3.9 03/03/2016 0544   CL 115 (H) 03/03/2016 0544   CO2 20 (L) 03/03/2016 0544   GLUCOSE 128 (H) 03/03/2016 0544   BUN 30 (H) 03/03/2016 0544   BUN 23 (A) 01/14/2016   CREATININE 0.71 03/03/2016 0544   CALCIUM 8.4 (L) 03/03/2016 0544   PROT 6.1 (L) 03/03/2016 0544   ALBUMIN 1.6 (L) 03/03/2016 0544   AST 59 (H) 03/03/2016 0544   ALT 54 03/03/2016 0544   ALKPHOS 174 (H) 03/03/2016 0544   BILITOT 0.3 03/03/2016 0544   GFRNONAA >60 03/03/2016 0544   GFRAA >60 03/03/2016 0544   Lipase     Component Value Date/Time   LIPASE 55 (H) 01/20/2016 0158   Anti-infectives: Anti-infectives    Start     Dose/Rate Route Frequency Ordered Stop   02/29/16 2000  cefTRIAXone (ROCEPHIN) 2 g in dextrose 5 % 50 mL IVPB     2 g 100 mL/hr over 30 Minutes Intravenous Every 24 hours 02/29/16 1804 03/02/16 2017   02/27/16 2000  cefTRIAXone (ROCEPHIN) 2 g in dextrose 5 % 50 mL IVPB  Status:  Discontinued     2 g 100 mL/hr over 30 Minutes Intravenous Every 24 hours 02/26/16 2137 02/29/16 1804      Assessment/Plan  EC fistula - blood pressure (120/50) and hgb (7.9) stable  - SBFT 1/16 shows leak from GJ anastomosis approximately 30cm proximal to J tube tip - tolerated continuous TF last night at 7210mL/hr - only 150cc output from eakins pouch documented over last 24 hours - continue only liquid medications via J tube and no crushed meds - continue G-tube to gravity  FEN -  NPO, IVF, TPN, TF at night VTE - Lovenox   Plan - continue cyclic trickle TF at 10 mL/hr. Will consider advancing rate on Monday 1/22    LOS: 7 days    Adam Phenix , [facility] 03/04/2016, 8:15 AM Pager: [phone] Consults: [phone] Mon-Fri 7:00 am-4:30 pm Sat-Sun 7:00 am-11:30 am

## 2016-03-05 ENCOUNTER — Inpatient Hospital Stay (HOSPITAL_COMMUNITY): Payer: Medicare Other | Admitting: Occupational Therapy

## 2016-03-05 LAB — GLUCOSE, CAPILLARY
GLUCOSE-CAPILLARY: 139 mg/dL — AB (ref 65–99)
GLUCOSE-CAPILLARY: 142 mg/dL — AB (ref 65–99)
Glucose-Capillary: 85 mg/dL (ref 65–99)
Glucose-Capillary: 183 mg/dL — ABNORMAL HIGH (ref 65–99)

## 2016-03-05 MED ORDER — FAT EMULSION 20 % IV EMUL
240.0000 mL | INTRAVENOUS | Status: AC
  Administered 2016-03-05: 240 mL via INTRAVENOUS
  Filled 2016-03-05: qty 250

## 2016-03-05 MED ORDER — CLINIMIX E/DEXTROSE (4.25/25) 4.25 % IV SOLN
INTRAVENOUS | Status: AC
  Administered 2016-03-05: 18:00:00 via INTRAVENOUS
  Filled 2016-03-05: qty 2000

## 2016-03-05 MED ORDER — ALPRAZOLAM 0.5 MG PO TABS
0.5000 mg | ORAL_TABLET | Freq: Every evening | ORAL | Status: DC | PRN
  Administered 2016-03-05 – 2016-03-06 (×2): 0.5 mg
  Filled 2016-03-05 (×2): qty 1

## 2016-03-05 MED ORDER — ACETAMINOPHEN 160 MG/5ML PO SOLN
500.0000 mg | Freq: Four times a day (QID) | ORAL | Status: DC | PRN
  Administered 2016-03-05 – 2016-03-13 (×5): 500 mg via JEJUNOSTOMY
  Filled 2016-03-05 (×5): qty 20.3

## 2016-03-05 NOTE — Progress Notes (Signed)
Occupational Therapy Session Note  Patient Details  Name: Ardyth GalRalph T Siever MRN: [id number] Date of Birth: 20-Jan-1941  Today's Date: 03/05/2016 OT Individual Time: 1341-1416 OT Individual Time Calculation (min): 35 min   Skilled Therapeutic Interventions/Progress Updates: Pt was lying in bed at time of arrival, reported pain in pelvic area but agreeable to tx. RN made aware. Pt participated in session at EOB, requiring Mod A for supine<sit with max multimodal cues for technique. Tx focus on cognition, sitting balance, and activity tolerance. Pt taught OT simple meaningful card game with instruction accuracy (per spouse [name] report). Pt exhibited right LOBs when removing bilateral UEs from bed support. Max cues for upright posture and Min-Mod A for balance. Pt then reported feeling very dizzy. He was returned to bed and BP assessed: 116/39. Pt placed in trendelenberg position to manage BP, with BP decreasing to 105/60. Pt still reported feeling dizzy. Bed was then repositioned back to normal. BP 124/51. Dizziness did not subside. RN retrieved and notified. Pt was left in care of spouse and RN at time of departure.   Therapy Documentation Precautions:  Precautions Precautions: Fall Precaution Comments: NPO, gastrostomy tube, LLQ JP drain, abdominal surgical incision with pouch Restrictions Weight Bearing Restrictions: No General:   Vital Signs: Therapy Vitals Temp: 97.4 F (36.3 C) Temp Source: Oral Pulse Rate: (!) 58 Resp: 18 BP: 123/64 Patient Position (if appropriate): Sitting Oxygen Therapy SpO2: 92 % O2 Device: Not Delivered Pain: Pain Assessment Pain Assessment: 0-10 Pain Score: 4  Pain Type: Acute pain Pain Location: Abdomen Pain Orientation: Mid Pain Descriptors / Indicators: Aching Pain Frequency: Intermittent Pain Onset: With Activity Patients Stated Pain Goal: 2 Pain Intervention(s): Medication (See eMAR) Multiple Pain Sites: No ADL:      See Function Navigator  for Current Functional Status.   Therapy/Group: Individual Therapy  Laylonie Marzec A Aubri Gathright 03/05/2016, 3:52 PM

## 2016-03-05 NOTE — Progress Notes (Signed)
[place] [facility] PHYSICAL MEDICINE & REHABILITATION     PROGRESS NOTE  Subjective/Complaints:  In bed. States he's been doing pretty well although this morning he awoke and his belly was more sore  ROS: pt denies nausea, vomiting, diarrhea, cough, shortness of breath or chest pain  Objective: Vital Signs: Blood pressure (!) 126/48, pulse 93, temperature 98.6 F (37 C), temperature source Oral, resp. rate 18, height 6\' 2"  (1.88 m), weight 76 kg (167 lb 8.8 oz), SpO2 98 %. No results found.  Recent Labs  03/03/16 0544  WBC 8.2  HGB 7.9*  HCT 25.3*  PLT 452*    Recent Labs  03/03/16 0544  NA 140  K 3.9  CL 115*  GLUCOSE 128*  BUN 30*  CREATININE 0.71  CALCIUM 8.4*   CBG (last 3)   Recent Labs  03/04/16 1958 03/04/16 2334 03/05/16 0410  GLUCAP 205* 137* 142*    Wt Readings from Last 3 Encounters:  03/05/16 76 kg (167 lb 8.8 oz)  02/26/16 78.6 kg (173 lb 4.5 oz)  01/19/16 79.2 kg (174 lb 8 oz)    Physical Exam:  BP (!) 126/48 (BP Location: Left Arm)   Pulse 93   Temp 98.6 F (37 C) (Oral)   Resp 18   Ht 6\' 2"  (1.88 m)   Wt 76 kg (167 lb 8.8 oz)   SpO2 98%   BMI 21.51 kg/m  Constitutional: NAD. Vital signs reviewed. Frail.  HENT: Normocephalic and atraumatic. Large stoma/concavity from trach and his anatomy Eyes: EOMI. No discharge.  Cardiovascular: RRR. No JVD. Respiratory: No respiratory distress. Unlabored.Rales or rhonchi GI: He exhibits no distension. BS+. +Eakins pouch remains sealed.  LUQ drain  LLQ drain  Stomach in general tender to palpation Musculoskeletal: He exhibits no edema. He exhibits no tenderness.  Neurological: He is alert.  Sitting balance is good Speech with dysarthria, improving breath support Skin: Skin is warm and dry. Abdominal wounds with dressing.   Assessment/Plan: 1. Functional deficits secondary to debility and encephalopathy which require 3+ hours per day of interdisciplinary therapy in a comprehensive inpatient rehab  setting. Physiatrist is providing close team supervision and 24 hour management of active medical problems listed below. Physiatrist and rehab team continue to assess barriers to discharge/monitor patient progress toward functional and medical goals.  Function:  Bathing Bathing position Bathing activity did not occur: Refused Position: Bed  Bathing parts Body parts bathed by patient: Right arm, Left arm, Chest Body parts bathed by helper: Left lower leg, Right lower leg, Left upper leg, Right upper leg  Bathing assist Assist Level:  (Total A)      Upper Body Dressing/Undressing Upper body dressing Upper body dressing/undressing activity did not occur: Safety/medical concerns What is the patient wearing?: Pull over shirt/dress     Pull over shirt/dress - Perfomed by patient: Thread/unthread right sleeve, Thread/unthread left sleeve Pull over shirt/dress - Perfomed by helper: Put head through opening, Pull shirt over trunk        Upper body assist Assist Level: Touching or steadying assistance(Pt > 75%)      Lower Body Dressing/Undressing Lower body dressing Lower body dressing/undressing activity did not occur: Safety/medical concerns What is the patient wearing?: Pants, Ted Hose, Non-skid slipper socks       Pants- Performed by helper: Thread/unthread right pants leg, Thread/unthread left pants leg, Pull pants up/down   Non-skid slipper socks- Performed by helper: Don/doff right sock, Don/doff left sock   Socks - Performed by helper: Don/doff right  sock, Don/doff left sock           TED Hose - Performed by helper: Don/doff left TED hose, Don/doff right TED hose  Lower body assist Assist for lower body dressing: 2 Helpers      Toileting Toileting Toileting activity did not occur: N/A Toileting steps completed by patient: Performs perineal hygiene Toileting steps completed by helper: Adjust clothing after toileting Toileting Assistive Devices: Grab bar or rail   Toileting assist Assist level: Touching or steadying assistance (Pt.75%)   Transfers Chair/bed transfer   Chair/bed transfer method: Ambulatory Chair/bed transfer assist level: Touching or steadying assistance (Pt > 75%) Chair/bed transfer assistive device: Armrests, Patent attorneyWalker     Locomotion Ambulation     Max distance: 25 ft Assist level: Touching or steadying assistance (Pt > 75%)   Wheelchair   Type: Manual Max wheelchair distance: 100 Assist Level: Supervision or verbal cues  Cognition Comprehension Comprehension assist level: Understands complex 90% of the time/cues 10% of the time  Expression Expression assist level: Expresses basic 25 - 49% of the time/requires cueing 50 - 75% of the time. Uses single words/gestures.  Social Interaction Social Interaction assist level: Interacts appropriately 75 - 89% of the time - Needs redirection for appropriate language or to initiate interaction.  Problem Solving Problem solving assist level: Solves basic 50 - 74% of the time/requires cueing 25 - 49% of the time  Memory Memory assist level: Recognizes or recalls 50 - 74% of the time/requires cueing 25 - 49% of the time    Medical Problem List and Plan: 1. Debility and encephalopathy secondary to small bowel obstruction and associated complications   Cont CIR PT, OT, speech therapy  Surgery evaluated, trickle feeds per Surgery,  No crushed meds through feeding tube 2. DVT Prophylaxis/Anticoagulation: Pharmaceutical: Lovenox  3. Pain Management: Changed PCA to IV morphine every 4 hours prn severe pain   -pain in general has been better controlled. Pt aware there will be ebbs and flows 4. Mood: team to provide ego support. LCSW to follow for evaluation and support.   Prozac started 1/18 5. Neuropsych: This patient is not fully capable of making decisions on his own behalf.  6. Skin/Wound Care: Continue air mattress overlay. Continue large Eakin's pouch to manage wound drainage. WOC for  management. Encourage side lying to help manage MASD.   -I see no changes in abdomen or pouch/drains 7. Malnutrition/Fluids/Electrolytes/Nutrition: continue TPN.  8. Abdominal abscess: Leucocytosis resolving. Completed rocephin 1/17 9. Anemia of chronic illness:   Hb 7.9 on 1/18 (stable)  Cont to monitor  Hemoccult + 10. Electrolyte abnormality/Acute renal insufficiency: Managed by TNA  11. Encephalopathy: Changed haldol to IM prn for management of agitation/anxiety.  12. Hyperglycemia  Overall controlled 1/18 13. Leukocytosis: Resolved, WBC 8.2 on 03/03/2016  Afebrile  Ucx NG   14. Orthostasis,   orthostatic vital signs daily--negative. Can stop  Symptomatic, due to sig output from pouch  15. Sleep disturbance  Has tried Haldol without benefit, ativan causes hallucinations  Continue Xanax, point 5 mg via tube daily at bedtime, may repeat times 1  LOS (Days) 8 A FACE TO FACE EVALUATION WAS PERFORMED  [place] Rico T 03/05/2016 8:48 AM

## 2016-03-05 NOTE — Progress Notes (Signed)
[facility] NOTE   Pharmacy Consult for TPN Indication: PTA continuation; bowel perforation and new EC fistula 02/13/16  Patient Measurements: Height: 6' 2"  (188 cm) Weight: 167 lb 8.8 oz (76 kg) IBW/kg (Calculated) : 82.2   Body mass index is 21.51 kg/m.  Assessment: 52 yoM with hx PUD, CAD, GERD, BPH,  s/p multiple procedures  and extended hospitalization for recurrent partial GOO 2/2 pyloric stricture on TPN for several days, discharged 11/15 with J/G tube in place on TFs, unable to tolerate and had to transition back to TPN via [facility] on 11/22, readmitted 12/6 with partial SBO.  Note: PTA TNA from [facility] was as follows: Totals per day: 405 grams (1376 kcal) of dextrose, 75 grams (300 kcal) of protein and 50 grams (480 kcal) of lipid for a total of 2156 kcal/day  Of note, patient failed MBS on 12/12 and did not tolerate bolus feeds through G-tube 12/13. On 12/23, patient had ex lap, lysis of lesions, segmental small bowel resection, and Gastrojejunostomy. On 12/29, TF were found draining from midline incision with CT abd showing bowel perf at [place] sit with development of abscesses.   GI: albumin 1.6, prealbumin 21.5, LBM 1/14.  DG small bowel study on 1/16 showed retrograde flow of contrast injected into J tube demonstrating EC fistula, no evidence of contrast entering percutaneous drainage catheter in LLQ, and contrast flowing anterograde into cecum TF never increased past 10 mL/hr due to abdominal pain and increased output. LBM 1/18. Eakin's pouch with 274m out in last 24h. Per surgical team - trickle only over weekend then retry to advance on Monday.  Endo: No hx DM. CBGs ranging on low side 88-95 when TPN and TF off (*low of 73 on 1/17), 137-205 during "on" cycle (205 was only elevated CBG, rest remain >150); SSI has been discontinued. No insulin in TPN. Watch closely as dextrose amount increased to 500g with 4.25/25 formulation and increased  rate.  Insulin requirements in the past 24 hours: 0 Lytes: Na 140, K 3.9, Phos 3.9, Mag 1.7, CorCa ~10.3 - last labs 1/18 Renal: Scr 0.71 on 1/18- IVF per surgery, currently prn only Pulm: no issues Cards: Orthostatic hypotension improved  Hepatobil: AST up slightly at 59, ALT normal, alk phos 174, trigs 122 on 1/15 Neuro: patient on low dose IV morphine for pain, scheduled alprazolam suspension ID: WBC wnl, urine cx pending, CXR improving atelectasis   Ceftriaxone 1/13>> 1/17  Best Practices: enoxaparin TPN Access: PICC TPN start date: P[facility]  Nutritional Goals (per RD recommendations 1/19): kCal: 2100 - 2300/day  Protein: 95 - 110 g/day Fluid: > 1.8 L/day  Current Nutrition:  Jevity 1.2- started at 180mhr over 16 hours without advancement for now (192 kcal, 1353Urotein) Cyclic TPN as below (providing 2040 kcal, 85g protein)  Plan:  - Per surgery, to continue Jevity 1.2kcal at 10 mL/hr over 16 hours (1600 to 0800) through weekend and retry advancement on Monday -Continue Clinimix E 4.0.23/34o cyclic TPN at 50 ml/hr x 1 hr, then 190 mL/hr x 10 hrs, then 50 mL/hr x 1 hr, then stop + cyclic 2035%ipid emulsion at 20 ml/hr x 12hr.  (TF at 10 mL/hr + TPN order provides 2232kcal and 98g protein a day, meeting ~100% of needs) - Multivitamin to 1545m1800 per tube - No trace elements required as patient is receiving these from TF and on national shortage - Monitor TPN labs Mon and Thu -  Primary team managing IVF - Watch CBGs when TPN and TF are off - running on low side. Consider moving TF times? - will discuss with RD.   Sloan Leiter, PharmD, BCPS Clinical Pharmacist Clinical Phone 03/05/2016 until 3:30 PM, [phone] After hours, please call [phone] 03/05/2016 7:55 AM

## 2016-03-05 NOTE — Plan of Care (Signed)
Problem: RH BLADDER ELIMINATION Goal: RH STG MANAGE BLADDER WITH ASSISTANCE STG Manage Bladder With min Assistance   Outcome: Not Progressing Condom cath

## 2016-03-05 NOTE — Progress Notes (Signed)
Occupational Therapy Session Note  Patient Details  Name: [name] MRN: [id number] Date of Birth: 1940-09-08  Today's Date: 03/05/2016 OT Individual Time: 1105-1210 OT Individual Time Calculation (min): 65 min   Short Term Goals: Week 2:  OT Short Term Goal 1 (Week 2): Pt will complete bathing with mod assist at sit > stand level OT Short Term Goal 2 (Week 2): Pt will tolerate 5 minutes of standing activity in preparation for ADL tasks OT Short Term Goal 3 (Week 2): Pt will complete LB dressing with Mod A  Skilled Therapeutic Interventions/Progress Updates:    Pt greeted in bed, reporting 6/10 abdominal pain. Discussed administering pain meds 1/2 way through therapy and pt agreeable. Stand-pivot transfer with Mod A. Modified bathing/dressing completed in w/c at the sink with focus on sit<>stand, standing endurance, and activity modifications with abdominal wounds. Pt required increased time, multiple rest breaks, and encouragement for increased participation with bathing/dressing. Pt progressed to min A sit<>stand and was able to tolerate 3 minutes of standing during ADL. Pt left seated in recliner at end of session with needs met and family present.   Therapy Documentation Precautions:  Precautions Precautions: Fall Precaution Comments: NPO, gastrostomy tube, LLQ JP drain, abdominal surgical incision with pouch Restrictions Weight Bearing Restrictions: No Pain: Pain Assessment Pain Assessment: 0-10 Pain Score: 6  Pain Type: Acute pain Pain Location: Abdomen Pain Orientation: Mid Pain Descriptors / Indicators: Aching Pain Frequency: Constant Pain Onset: On-going Patients Stated Pain Goal: 2 Pain Intervention(s): RN Administered pain meds Multiple Pain Sites: No     See Function Navigator for Current Functional Status.   Therapy/Group: Individual Therapy  Valma Cava 03/05/2016, 12:23 PM

## 2016-03-05 NOTE — Plan of Care (Signed)
Problem: RH PAIN MANAGEMENT Goal: RH STG PAIN MANAGED AT OR BELOW PT'S PAIN GOAL Remain less than 4  Outcome: Not Progressing Pt with pain levels between 4-6.

## 2016-03-06 ENCOUNTER — Inpatient Hospital Stay (HOSPITAL_COMMUNITY): Payer: Medicare Other | Admitting: Physical Therapy

## 2016-03-06 LAB — GLUCOSE, CAPILLARY
GLUCOSE-CAPILLARY: 96 mg/dL (ref 65–99)
GLUCOSE-CAPILLARY: 86 mg/dL (ref 65–99)
GLUCOSE-CAPILLARY: 171 mg/dL — AB (ref 65–99)
Glucose-Capillary: 144 mg/dL — ABNORMAL HIGH (ref 65–99)
Glucose-Capillary: 98 mg/dL (ref 65–99)

## 2016-03-06 MED ORDER — FAT EMULSION 20 % IV EMUL
240.0000 mL | INTRAVENOUS | Status: AC
  Administered 2016-03-06: 240 mL via INTRAVENOUS
  Filled 2016-03-06: qty 250

## 2016-03-06 MED ORDER — TRACE MINERALS CR-CU-MN-SE-ZN 10-1000-500-60 MCG/ML IV SOLN
INTRAVENOUS | Status: AC
  Administered 2016-03-06: 18:00:00 via INTRAVENOUS
  Filled 2016-03-06: qty 2000

## 2016-03-06 NOTE — Progress Notes (Signed)
[place] NOTE   Pharmacy Consult for TPN Indication: PTA continuation; bowel perforation and new EC fistula 02/13/16  Patient Measurements: Height: '6\' 2"'$  (188 cm) Weight: 167 lb 8.8 oz (76 kg) IBW/kg (Calculated) : 82.2   Body mass index is 21.51 kg/m.  Assessment: 37 yoM with hx PUD, CAD, GERD, BPH,  s/p multiple procedures  and extended hospitalization for recurrent partial GOO 2/2 pyloric stricture on TPN for several days, discharged 11/15 with J/G tube in place on TFs, unable to tolerate and had to transition back to TPN via [facility] on 11/22, readmitted 12/6 with partial SBO.  Note: PTA TNA from [place] was as follows: Totals per day: 405 grams (1376 kcal) of dextrose, 75 grams (300 kcal) of protein and 50 grams (480 kcal) of lipid for a total of 2156 kcal/day  Of note, patient failed MBS on 12/12 and did not tolerate bolus feeds through G-tube 12/13. On 12/23, patient had ex lap, lysis of lesions, segmental small bowel resection, and Gastrojejunostomy. On 12/29, TF were found draining from midline incision with CT abd showing bowel perf at [place] sit with development of abscesses.   GI: albumin 1.6, prealbumin 21.5, LBM 1/14.  DG small bowel study on 1/16 showed retrograde flow of contrast injected into J tube demonstrating EC fistula, no evidence of contrast entering percutaneous drainage catheter in LLQ, and contrast flowing anterograde into cecum TF never increased past 10 mL/hr due to abdominal pain and increased output. LBM 1/19. Eakin's pouch with 234m out in last 24h. Per surgical team - trickle only over weekend then retry to advance on Monday.  Endo: No hx DM. CBGs 85 *only one charted* when TPN and TF off (*low of 73 on 1/17), 139-183 during "on" cycle (205 was only elevated CBG, rest remain >150); SSI has been discontinued. No insulin in TPN. Watch closely as dextrose amount increased to 500g with 4.25/25 formulation and increased rate.   Insulin requirements in the past 24 hours: 0 Lytes: Na 140, K 3.9, Phos 3.9, Mag 1.7, CorCa ~10.3 - last labs 1/18 Renal: Scr 0.71 on 1/18- IVF per surgery, currently prn only Pulm: no issues Cards: Orthostatic hypotension improved  Hepatobil: AST up slightly at 59, ALT normal, alk phos 174, trigs 122 on 1/15 Neuro: patient on low dose IV morphine for pain, scheduled alprazolam suspension ID: WBC wnl, urine cx pending, CXR improving atelectasis   Ceftriaxone 1/13>> 1/17  Best Practices: enoxaparin TPN Access: PICC TPN start date: PTA; [place]  Nutritional Goals (per RD recommendations 1/19): kCal: 2100 - 2300/day  Protein: 95 - 110 g/day Fluid: > 1.8 L/day  Current Nutrition:  Jevity 1.2- started at 164mhr over 16 hours without advancement for now (192 kcal, 1302Xrotein) Cyclic TPN as below (providing 2040 kcal, 85g protein) *Confirmed with RN 1/21 that rate changes did occur appropriately overnight  Plan:  - Per surgery, to continue Jevity 1.2kcal at 10 mL/hr over 16 hours (1600 to 0800) through weekend and retry advancement on Monday -Continue Clinimix E 4.1.15/52o cyclic TPN at 50 ml/hr x 1 hr, then 190 mL/hr x 10 hrs, then 50 mL/hr x 1 hr, then stop + cyclic 2008%ipid emulsion at 20 ml/hr x 12hr.  (TF at 10 mL/hr + TPN order provides 2232kcal and 98g protein a day, meeting ~100% of needs) - Multivitamin to 1527m1800 per tube - Will add trace elements today (on national shortage, dosing every other day  and will hold if TF advanced).  - Monitor TPN labs Mon and Thu - Primary team managing IVF - Watch CBGs when TPN and TF are off - running on low side.  - Consider moving TF times or extending TF time to 20 hrs to account for this  Sloan Leiter, PharmD, BCPS Clinical Pharmacist Clinical Phone 03/06/2016 until 3:30 PM, [phone] After hours, please call #[phone] 03/06/2016 7:49 AM

## 2016-03-06 NOTE — Progress Notes (Signed)
Physical Therapy Session Note  Patient Details  Name: [name] MRN: [id number] Date of Birth: 11-Aug-1940  Today's Date: 03/06/2016 PT Individual Time: 0932-1018 PT Individual Time Calculation (min): 46 min   Short Term Goals: Week 2:  PT Short Term Goal 1 (Week 2): Patient will perform bed mobility with supervision consistently PT Short Term Goal 2 (Week 2): Patient will perform sit-to-stand and stand pivot transfers using RW with min A and minimal cues   PT Short Term Goal 3 (Week 2): Patient will ambulate 75 ft with RW and min A PT Short Term Goal 4 (Week 2): Patient will maintain standing balance with min A for 3 minutes PT Short Term Goal 5 (Week 2): Patient will have initiated stair training   Skilled Therapeutic Interventions/Progress Updates:  Pt was found in bed with toileting completed and ted hose/socks on by nursing. Pt stated having dizziness symptoms in supine that were rated 5/10.  Supine to sit at EOB with HOB elevated and using handrails required mod assist with verbal and tactile cues for sequencing.  Ambulatory transfer of 8 ft with RW from EOB to W/C required min A to lift and lower and verbal cues for sequence, handplacement, and proper use of Rw.  Sit-to-stand transfers throughout therapy required min A lifting and lowering.   Dizziness symptoms increased to 7/10 in W/C. Vitals in W/C were BP: 106/45; HR: 88; SpO2: 100%. Immediately after standing from W/C, BP was 103/47.   Pt performed 3 trials of standing tolerance with RW and min A to maintain proper anterior hip position. Pt achieved 1 min, 1 min 15 sec, and 30 sec with each trial respectively.    Seated therapeutic exercises in W/C include:   - 1x10 alternating high knees   - 1x10 LAQ with 5 sec hold bilaterally  - 1x10 isometric hip abduction with 5 sec hold  - 1x10 isometric hip adduction with 5 sec hold with a pillow.   Pt complained of no pain with therapy  Pt was left in W/C with all needs  within reach. Pt verbalized having dizziness symptoms of 4/10 in W/C after therapy. Pt was instructed to remain in W/C for minimum 30 minutes and to call nursing when time is up. Nursing was notified about 30 min sitting objective.  Therapy Documentation Precautions:  Precautions Precautions: Fall Precaution Comments: NPO, gastrostomy tube, LLQ JP drain, abdominal surgical incision with pouch Restrictions Weight Bearing Restrictions: No General:    Vitals:  BP : 106/45 sitting; 103/47 standing 0 min later HR : 88 in sitting SpO2 : 100% in sitting   See Function Navigator for Current Functional Status.   Therapy/Group: Individual Therapy  [name] 03/06/2016, 10:07 AM

## 2016-03-06 NOTE — Progress Notes (Signed)
[place] PHYSICAL MEDICINE & REHABILITATION     PROGRESS NOTE  Subjective/Complaints:  Complaining of nausea this morning with mild abdominal pain. Able to sleep last night  ROS: pt denies   vomiting, diarrhea, cough, shortness of breath or chest pain   Objective: Vital Signs: Blood pressure (!) 142/48, pulse 81, temperature 98 F (36.7 C), temperature source Oral, resp. rate 16, height 6\' 2"  (1.88 m), weight 76 kg (167 lb 8.8 oz), SpO2 92 %. No results found. No results for input(s): WBC, HGB, HCT, PLT in the last 72 hours. No results for input(s): NA, K, CL, GLUCOSE, BUN, CREATININE, CALCIUM in the last 72 hours.  Invalid input(s): CO CBG (last 3)   Recent Labs  03/05/16 2359 03/06/16 0402 03/06/16 0830  GLUCAP 139* 144* 98    Wt Readings from Last 3 Encounters:  03/06/16 76 kg (167 lb 8.8 oz)  02/26/16 78.6 kg (173 lb 4.5 oz)  01/19/16 79.2 kg (174 lb 8 oz)    Physical Exam:  BP (!) 142/48 (BP Location: Left Arm)   Pulse 81   Temp 98 F (36.7 C) (Oral)   Resp 16   Ht 6\' 2"  (1.88 m)   Wt 76 kg (167 lb 8.8 oz)   SpO2 92%   BMI 21.51 kg/m  Constitutional: NAD. Vital signs reviewed. Frail.  HENT: Normocephalic and atraumatic. Large stoma/concavity from trach and his anatomy Eyes: EOMI. No discharge.  Cardiovascular: RRR. No JVD. Respiratory: CTA bilaterally GI: He exhibits no distension. BS+. +Eakins pouch remains sealed-unchanged  LUQ drain  LLQ drain  Stomach remains generallyl tender to palpation Musculoskeletal: He exhibits no edema. He exhibits no tenderness.  Neurological: He is alert.  Sitting balance is good Speech with dysarthria, improving breath support Skin: Skin is warm and dry. Abdominal wounds with dressing.   Assessment/Plan: 1. Functional deficits secondary to debility and encephalopathy which require 3+ hours per day of interdisciplinary therapy in a comprehensive inpatient rehab setting. Physiatrist is providing close team supervision  and 24 hour management of active medical problems listed below. Physiatrist and rehab team continue to assess barriers to discharge/monitor patient progress toward functional and medical goals.  Function:  Bathing Bathing position Bathing activity did not occur: Refused Position: Bed  Bathing parts Body parts bathed by patient: Right arm, Left arm, Chest Body parts bathed by helper: Left lower leg, Right lower leg, Left upper leg, Right upper leg  Bathing assist Assist Level:  (Total A)      Upper Body Dressing/Undressing Upper body dressing Upper body dressing/undressing activity did not occur: Safety/medical concerns What is the patient wearing?: Pull over shirt/dress     Pull over shirt/dress - Perfomed by patient: Thread/unthread right sleeve, Thread/unthread left sleeve Pull over shirt/dress - Perfomed by helper: Put head through opening, Pull shirt over trunk        Upper body assist Assist Level: Touching or steadying assistance(Pt > 75%)      Lower Body Dressing/Undressing Lower body dressing Lower body dressing/undressing activity did not occur: Safety/medical concerns What is the patient wearing?: Pants, Ted Hose, Non-skid slipper socks       Pants- Performed by helper: Thread/unthread right pants leg, Thread/unthread left pants leg, Pull pants up/down   Non-skid slipper socks- Performed by helper: Don/doff right sock, Don/doff left sock   Socks - Performed by helper: Don/doff right sock, Don/doff left sock           TED Hose - Performed by helper: Don/doff left TED  hose, Don/doff right TED hose  Lower body assist Assist for lower body dressing: 2 Helpers      Toileting Toileting Toileting activity did not occur: N/A Toileting steps completed by patient: Performs perineal hygiene Toileting steps completed by helper: Adjust clothing after toileting Toileting Assistive Devices: Grab bar or rail  Toileting assist Assist level: Touching or steadying assistance  (Pt.75%)   Transfers Chair/bed transfer   Chair/bed transfer method: Ambulatory Chair/bed transfer assist level: Touching or steadying assistance (Pt > 75%) Chair/bed transfer assistive device: Armrests, Patent attorneyWalker     Locomotion Ambulation     Max distance: 25 ft Assist level: Touching or steadying assistance (Pt > 75%)   Wheelchair   Type: Manual Max wheelchair distance: 100 Assist Level: Supervision or verbal cues  Cognition Comprehension Comprehension assist level: Understands complex 90% of the time/cues 10% of the time  Expression Expression assist level: Expresses basic 25 - 49% of the time/requires cueing 50 - 75% of the time. Uses single words/gestures.  Social Interaction Social Interaction assist level: Interacts appropriately 75 - 89% of the time - Needs redirection for appropriate language or to initiate interaction.  Problem Solving Problem solving assist level: Solves basic 50 - 74% of the time/requires cueing 25 - 49% of the time  Memory Memory assist level: Recognizes or recalls 50 - 74% of the time/requires cueing 25 - 49% of the time    Medical Problem List and Plan: 1. Debility and encephalopathy secondary to small bowel obstruction and associated complications   Cont CIR PT, OT, speech therapy  Surgery evaluated, trickle feeds per Surgery   No crushed meds through feeding tube 2. DVT Prophylaxis/Anticoagulation: Pharmaceutical: Lovenox  3. Pain Management: Changed PCA to IV morphine every 4 hours prn severe pain   -pain in general has been better controlled. Pt aware there will be ebbs and flows 4. Mood: team to provide ego support. LCSW to follow for evaluation and support.   Prozac started 1/18 5. Neuropsych: This patient is not fully capable of making decisions on his own behalf.  6. Skin/Wound Care: Continue air mattress overlay. Continue large Eakin's pouch to manage wound drainage. WOC for management. Encourage side lying to help manage MASD.   -I see no  changes in abdomen or pouch/drains 7. Malnutrition/Fluids/Electrolytes/Nutrition: continue TPN.  8. Abdominal abscess: Leucocytosis resolving. Completed rocephin 1/17 9. Anemia of chronic illness:   Hb 7.9 on 1/18 (stable)  Cont to monitor/recheck this week  Hemoccult + 10. Electrolyte abnormality/Acute renal insufficiency: Managed by TNA  11. Encephalopathy: Changed haldol to IM prn for management of agitation/anxiety.  12. Hyperglycemia  Overall controlled 1/18 13. Leukocytosis: Resolved, WBC 8.2 on 03/03/2016  Afebrile  Ucx NG   14. Orthostasis,   orthostatic vital signs daily--negative. Can stop  Symptomatic, due to sig output from pouch  15. Sleep disturbance  Has tried Haldol without benefit, ativan causes hallucinations  Continue Xanax, point 5 mg via tube daily at bedtime, may repeat times 1  LOS (Days) 9 A FACE TO FACE EVALUATION WAS PERFORMED  SWARTZ,ZACHARY T 03/06/2016 9:06 AM

## 2016-03-07 ENCOUNTER — Inpatient Hospital Stay (HOSPITAL_COMMUNITY): Payer: Medicare Other | Admitting: Speech Pathology

## 2016-03-07 ENCOUNTER — Inpatient Hospital Stay (HOSPITAL_COMMUNITY): Payer: Medicare Other | Admitting: Occupational Therapy

## 2016-03-07 ENCOUNTER — Inpatient Hospital Stay (HOSPITAL_COMMUNITY): Payer: Medicare Other | Admitting: Physical Therapy

## 2016-03-07 LAB — PHOSPHORUS: PHOSPHORUS: 3.3 mg/dL (ref 2.5–4.6)

## 2016-03-07 LAB — COMPREHENSIVE METABOLIC PANEL
ALBUMIN: 1.6 g/dL — AB (ref 3.5–5.0)
ALT: 37 U/L (ref 17–63)
ANION GAP: 5 (ref 5–15)
AST: 33 U/L (ref 15–41)
Alkaline Phosphatase: 145 U/L — ABNORMAL HIGH (ref 38–126)
BILIRUBIN TOTAL: 0.1 mg/dL — AB (ref 0.3–1.2)
BUN: 27 mg/dL — ABNORMAL HIGH (ref 6–20)
CO2: 21 mmol/L — ABNORMAL LOW (ref 22–32)
Calcium: 8 mg/dL — ABNORMAL LOW (ref 8.9–10.3)
Chloride: 109 mmol/L (ref 101–111)
Creatinine, Ser: 0.6 mg/dL — ABNORMAL LOW (ref 0.61–1.24)
GFR calc non Af Amer: >60 mL/min (ref >60)
GLUCOSE: 74 mg/dL (ref 65–99)
POTASSIUM: 3.7 mmol/L (ref 3.5–5.1)
SODIUM: 135 mmol/L (ref 135–145)
TOTAL PROTEIN: 5.8 g/dL — AB (ref 6.5–8.1)

## 2016-03-07 LAB — GLUCOSE, CAPILLARY
GLUCOSE-CAPILLARY: 157 mg/dL — AB (ref 65–99)
GLUCOSE-CAPILLARY: 97 mg/dL (ref 65–99)
Glucose-Capillary: 100 mg/dL — ABNORMAL HIGH (ref 65–99)
Glucose-Capillary: 149 mg/dL — ABNORMAL HIGH (ref 65–99)
Glucose-Capillary: 159 mg/dL — ABNORMAL HIGH (ref 65–99)
Glucose-Capillary: 164 mg/dL — ABNORMAL HIGH (ref 65–99)
Glucose-Capillary: 91 mg/dL (ref 65–99)

## 2016-03-07 LAB — CBC
HEMATOCRIT: 26.1 % — AB (ref 39.0–52.0)
Hemoglobin: 8.4 g/dL — ABNORMAL LOW (ref 13.0–17.0)
MCH: 29.5 pg (ref 26.0–34.0)
MCHC: 32.2 g/dL (ref 30.0–36.0)
MCV: 91.6 fL (ref 78.0–100.0)
PLATELETS: 365 10*3/uL (ref 150–400)
RBC: 2.85 MIL/uL — ABNORMAL LOW (ref 4.22–5.81)
RDW: 16.3 % — AB (ref 11.5–15.5)
WBC: 10.9 10*3/uL — AB (ref 4.0–10.5)

## 2016-03-07 LAB — DIFFERENTIAL
BASOS ABS: 0 10*3/uL (ref 0.0–0.1)
BASOS PCT: 0 %
EOS ABS: 0.1 10*3/uL (ref 0.0–0.7)
EOS PCT: 1 %
Lymphocytes Relative: 21 %
Lymphs Abs: 2.3 10*3/uL (ref 0.7–4.0)
Monocytes Absolute: 1.3 10*3/uL — ABNORMAL HIGH (ref 0.1–1.0)
Monocytes Relative: 12 %
NEUTROS PCT: 66 %
Neutro Abs: 7.2 10*3/uL (ref 1.7–7.7)

## 2016-03-07 LAB — TRIGLYCERIDES: Triglycerides: 118 mg/dL (ref <150)

## 2016-03-07 LAB — OCCULT BLOOD X 1 CARD TO LAB, STOOL: FECAL OCCULT BLD: NEGATIVE

## 2016-03-07 LAB — MAGNESIUM: MAGNESIUM: 1.6 mg/dL — AB (ref 1.7–2.4)

## 2016-03-07 LAB — PREALBUMIN: Prealbumin: 24 mg/dL (ref 18–38)

## 2016-03-07 MED ORDER — CLINIMIX E/DEXTROSE (4.25/25) 4.25 % IV SOLN
INTRAVENOUS | Status: AC
  Administered 2016-03-07: 19:00:00 via INTRAVENOUS
  Filled 2016-03-07: qty 2000

## 2016-03-07 MED ORDER — MAGNESIUM SULFATE 2 GM/50ML IV SOLN
2.0000 g | Freq: Once | INTRAVENOUS | Status: AC
  Administered 2016-03-07: 2 g via INTRAVENOUS
  Filled 2016-03-07: qty 50

## 2016-03-07 MED ORDER — JEVITY 1.2 CAL PO LIQD
1000.0000 mL | Freq: Every day | ORAL | Status: DC
  Administered 2016-03-07 – 2016-03-08 (×2): 1000 mL
  Filled 2016-03-07 (×4): qty 1000

## 2016-03-07 MED ORDER — NONFORMULARY OR COMPOUNDED ITEM
0.5000 mg | Freq: Every evening | Status: DC | PRN
  Administered 2016-03-07 – 2016-03-20 (×13): 0.5 mg
  Administered 2016-03-22: 0.5 mL
  Administered 2016-03-23 – 2016-03-29 (×7): 0.5 mg
  Filled 2016-03-07 (×49): qty 1

## 2016-03-07 NOTE — Progress Notes (Signed)
[name] PHYSICAL MEDICINE & REHABILITATION     PROGRESS NOTE  Subjective/Complaints: Pt seen laying in bed this AM.  He slept well overnight.  He is more alert and interactive this AM with improved speech.   ROS: Denies vomiting, diarrhea, shortness of breath or chest pain  Objective: Vital Signs: Blood pressure (!) 128/52, pulse 88, temperature 98 F (36.7 C), temperature source Oral, resp. rate 18, height 6\' 2"  (1.88 m), weight 78 kg (171 lb 15.3 oz), SpO2 99 %. No results found.  Recent Labs  03/07/16 0555  WBC 10.9*  HGB 8.4*  HCT 26.1*  PLT 365    Recent Labs  03/07/16 0555  NA 135  K 3.7  CL 109  GLUCOSE 74  BUN 27*  CREATININE 0.60*  CALCIUM 8.0*   CBG (last 3)   Recent Labs  03/06/16 1933 03/06/16 2338 03/07/16 0351  GLUCAP 171* 157* 149*    Wt Readings from Last 3 Encounters:  03/07/16 78 kg (171 lb 15.3 oz)  02/26/16 78.6 kg (173 lb 4.5 oz)  01/19/16 79.2 kg (174 lb 8 oz)    Physical Exam:  BP (!) 128/52 (BP Location: Left Arm)   Pulse 88   Temp 98 F (36.7 C) (Oral)   Resp 18   Ht 6\' 2"  (1.88 m)   Wt 78 kg (171 lb 15.3 oz)   SpO2 99%   BMI 22.08 kg/m  Constitutional: NAD. Vital signs reviewed. Frail.  HENT: Normocephalic and atraumatic. +Stoma. Eyes: EOMI. No discharge.  Cardiovascular: RRR. No JVD. Respiratory: CTA bilaterally. Unlabored.  GI: He exhibits no distension. BS+. +Eakins pouch  LUQ drain  LLQ drain  Musculoskeletal: He exhibits no edema. He exhibits no tenderness.  Neurological: He is alert.  Sitting balance is good Speech with dysarthria, improving  Motor: 4-/5 throughout. Skin: Skin is warm and dry. Abdominal wounds with dressing.   Assessment/Plan: 1. Functional deficits secondary to debility and encephalopathy which require 3+ hours per day of interdisciplinary therapy in a comprehensive inpatient rehab setting. Physiatrist is providing close team supervision and 24 hour management of active medical problems  listed below. Physiatrist and rehab team continue to assess barriers to discharge/monitor patient progress toward functional and medical goals.  Function:  Bathing Bathing position Bathing activity did not occur: Refused Position: Bed  Bathing parts Body parts bathed by patient: Right arm, Left arm, Chest Body parts bathed by helper: Left lower leg, Right lower leg, Left upper leg, Right upper leg  Bathing assist Assist Level:  (Total A)      Upper Body Dressing/Undressing Upper body dressing Upper body dressing/undressing activity did not occur: Safety/medical concerns What is the patient wearing?: Pull over shirt/dress     Pull over shirt/dress - Perfomed by patient: Thread/unthread right sleeve, Thread/unthread left sleeve Pull over shirt/dress - Perfomed by helper: Put head through opening, Pull shirt over trunk        Upper body assist Assist Level: Touching or steadying assistance(Pt > 75%)      Lower Body Dressing/Undressing Lower body dressing Lower body dressing/undressing activity did not occur: Safety/medical concerns What is the patient wearing?: Pants, Ted Hose, Non-skid slipper socks       Pants- Performed by helper: Thread/unthread right pants leg, Thread/unthread left pants leg, Pull pants up/down   Non-skid slipper socks- Performed by helper: Don/doff right sock, Don/doff left sock   Socks - Performed by helper: Don/doff right sock, Don/doff left sock  TED Hose - Performed by helper: Don/doff left TED hose, Don/doff right TED hose  Lower body assist Assist for lower body dressing: 2 Helpers      Toileting Toileting Toileting activity did not occur: N/A Toileting steps completed by patient: Performs perineal hygiene Toileting steps completed by helper: Adjust clothing after toileting Toileting Assistive Devices: Grab bar or rail  Toileting assist Assist level: Touching or steadying assistance (Pt.75%)   Transfers Chair/bed transfer    Chair/bed transfer method: Ambulatory Chair/bed transfer assist level: Maximal assist (Pt 25 - 49%/lift and lower) Chair/bed transfer assistive device: Armrests, Patent attorneyWalker     Locomotion Ambulation     Max distance: 8 ft Assist level: Touching or steadying assistance (Pt > 75%)   Wheelchair   Type: Manual Max wheelchair distance: 100 Assist Level: Supervision or verbal cues  Cognition Comprehension Comprehension assist level: Understands complex 90% of the time/cues 10% of the time  Expression Expression assist level: Expresses basic 25 - 49% of the time/requires cueing 50 - 75% of the time. Uses single words/gestures.  Social Interaction Social Interaction assist level: Interacts appropriately 75 - 89% of the time - Needs redirection for appropriate language or to initiate interaction.  Problem Solving Problem solving assist level: Solves basic 50 - 74% of the time/requires cueing 25 - 49% of the time  Memory Memory assist level: Recognizes or recalls 50 - 74% of the time/requires cueing 25 - 49% of the time    Medical Problem List and Plan: 1. Debility and encephalopathy secondary to small bowel obstruction and associated complications   Cont CIR   Surgery evaluated, TFs per Surgery   No crushed meds through feeding tube 2. DVT Prophylaxis/Anticoagulation: Pharmaceutical: Lovenox  3. Pain Management: Changed PCA to IV morphine every 4 hours prn severe pain   pain in general has been better controlled. Pt aware there will be ebbs and flows 4. Mood: team to provide ego support. LCSW to follow for evaluation and support.   Prozac started 1/18 5. Neuropsych: This patient is not fully capable of making decisions on his own behalf.  6. Skin/Wound Care: Continue air mattress overlay. Continue large Eakin's pouch to manage wound drainage. WOC for management. Encourage side lying to help manage MASD.   -I see no changes in abdomen or pouch/drains 7.  Malnutrition/Fluids/Electrolytes/Nutrition: continue TPN.  8. Abdominal abscess: Leucocytosis resolving. Completed rocephin 1/17 9. Anemia of chronic illness:   Hb 8.4 on 1/22 (stable)  Cont to monitor  Hemoccult + 10. Electrolyte abnormality/Acute renal insufficiency: Managed by TNA   Will need to increased Mag and protein, will speak to pharmacy 11. Encephalopathy: Changed haldol to IM prn for management of agitation/anxiety.  12. Hyperglycemia  Elevated 1/22 13. Leukocytosis: Stable  WBC 10.9 on 03/07/2016  Afebrile  Ucx NG 14. Orthostasis: Improved  Symptomatic, due to sig output from pouch 15. Sleep disturbance  Has tried Haldol without benefit, ativan causes hallucinations  Continue Xanax elixer, no meds per tube  LOS (Days) 10 A FACE TO FACE EVALUATION WAS PERFORMED  Zahira Brummond Karis Jubanil Kaaliyah Kita 03/07/2016 9:32 AM

## 2016-03-07 NOTE — Progress Notes (Signed)
Occupational Therapy Session Note  Patient Details  Name: [name] MRN: [id number] Date of Birth: 25-Aug-1940  Today's Date: 03/07/2016 OT Individual Time: 2883-3744 OT Individual Time Calculation (min): 60 min    Short Term Goals: Week 2:  OT Short Term Goal 1 (Week 2): Pt will complete bathing with mod assist at sit > stand level OT Short Term Goal 2 (Week 2): Pt will tolerate 5 minutes of standing activity in preparation for ADL tasks OT Short Term Goal 3 (Week 2): Pt will complete LB dressing with Mod A  Skilled Therapeutic Interventions/Progress Updates:    received in w/c with 2/10 dizziness Pt stated he was fatigued from prior PT session.  In light of the fact that pt had another PT session scheduled after this OT session, tx session focused on UE strength, activity tolerance, postural strength.  From w/c: Pilates arm circles in sh flex, sh abd, and overhead 10 x 3 Push pull exercises with arms 10 x3 Elbow and forearm AROM  Sitting up tall in chair with alternating reach overhead of L and R 1x each, for 3 sets. Sit to partial stand with chair push up 7 x 2.  Pt requested to lay down for 30 min prior to PT session. Sit to stand and stand pivot to bed with mod A. Needs full A with legs due to abdominal pain. Pt set up in bed with all needs met. Bed alarm set.   Therapy Documentation Precautions:  Precautions Precautions: Fall Precaution Comments: NPO, gastrostomy tube, LLQ JP drain, abdominal surgical incision with pouch Restrictions Weight Bearing Restrictions: No    Vital Signs: Therapy Vitals Pulse Rate: 94 BP: (!) 115/94 Patient Position (if appropriate): Lying Pain:  Pain Assessment Pain Assessment: No/denies pain ADL:   See Function Navigator for Current Functional Status.   Therapy/Group: Individual Therapy  [place] 03/07/2016, 12:22 PM

## 2016-03-07 NOTE — Progress Notes (Signed)
Occupational Therapy Session Note  Patient Details  Name: [name] MRN: [id number] Date of Birth: May 12, 1940  Today's Date: 03/07/2016 OT Individual Time: 1533-1600 OT Individual Time Calculation (min): 27 min    Week 2:  OT Short Term Goal 1 (Week 2): Pt will complete bathing with mod assist at sit > stand level OT Short Term Goal 2 (Week 2): Pt will tolerate 5 minutes of standing activity in preparation for ADL tasks OT Short Term Goal 3 (Week 2): Pt will complete LB dressing with Mod A  Skilled Therapeutic Interventions/Progress Updates:   Upon entering the room, pt supine in bed with wife present. Pt transitions from SLP session with no c/o pain but reports, " I am extremely tired." Pt declined OOB activities these session secondary to fatigue. SLP just finished oral care and ice chips provided this session per pt request. OT provided handouts regarding energy conservation with self care tasks and general principles of conservation. OT educated pt on topic and discussed several every day tasks related to this topic. Education to continue. Pt remained in bed with call bell and all needed items within reach upon exiting the room.      Therapy Documentation Precautions:  Precautions Precautions: Fall Precaution Comments: NPO, gastrostomy tube, LLQ JP drain, abdominal surgical incision with pouch Restrictions Weight Bearing Restrictions: No General:   Vital Signs: Therapy Vitals Temp: 98.8 F (37.1 C) Temp Source: Oral Pulse Rate: 94 Resp: 18 BP: (!) 123/47 Patient Position (if appropriate): Lying Oxygen Therapy SpO2: 98 % O2 Device: Not Delivered Pain: Pain Assessment Pain Assessment: No/denies pain ADL:   Exercises:   Other Treatments:    See Function Navigator for Current Functional Status.   Therapy/Group: Individual Therapy  Alen BleacherBradsher, Daliana Leverett P 03/07/2016, 4:13 PM

## 2016-03-07 NOTE — Progress Notes (Signed)
[facility] Progress Note     Subjective: Pt not having much abdominal pain. No need for pain meds over the last few days. He is not having nausea or vomiting. He is having soft BM's. He states he is tired.   Objective: Vital signs in last 24 hours: Temp:  [98 F (36.7 C)] 98 F (36.7 C) (01/22 0511) Pulse Rate:  [84-88] 88 (01/22 0511) Resp:  [17-18] 18 (01/22 0511) BP: (104-128)/(43-52) 128/52 (01/22 0511) SpO2:  [99 %-100 %] 99 % (01/21 1403) Weight:  [171 lb 15.3 oz (78 kg)] 171 lb 15.3 oz (78 kg) (01/22 0511) Last BM Date: 03/06/16  Intake/Output from previous day: 01/21 0701 - 01/22 0700 In: 20 [I.V.:20] Out: 1526 [Urine:550; Drains:975; Stool:1] Intake/Output this shift: No intake/output data recorded.  PE: Gen: Alert, NAD, pleasant, sitting up in the chair Card: RRR, no M/G/R heard Pulm: rate and effort normal Abd: Abdominal binder in place, abdomen soft, non distended, +BS, eakins pouch in place with minimal drainage in bag, LLQ drain with minimal serosanguinous fluid in bag, very mild abdominal tenderness.              Left bulb drain: nothing charted in last 24h             EC fistuala/eakin pouch: nothing charted in last 24h             Gastrostomy tube: 975 cc/24h  Lab Results:   Recent Labs  03/07/16 0555  WBC 10.9*  HGB 8.4*  HCT 26.1*  PLT 365   BMET  Recent Labs  03/07/16 0555  NA 135  K 3.7  CL 109  CO2 21*  GLUCOSE 74  BUN 27*  CREATININE 0.60*  CALCIUM 8.0*   PT/INR No results for input(s): LABPROT, INR in the last 72 hours. CMP     Component Value Date/Time   NA 135 03/07/2016 0555   NA 139 01/14/2016   K 3.7 03/07/2016 0555   CL 109 03/07/2016 0555   CO2 21 (L) 03/07/2016 0555   GLUCOSE 74 03/07/2016 0555   BUN 27 (H) 03/07/2016 0555   BUN 23 (A) 01/14/2016   CREATININE 0.60 (L) 03/07/2016 0555   CALCIUM 8.0 (L) 03/07/2016 0555   PROT 5.8 (L) 03/07/2016 0555   ALBUMIN 1.6 (L) 03/07/2016 0555   AST 33  03/07/2016 0555   ALT 37 03/07/2016 0555   ALKPHOS 145 (H) 03/07/2016 0555   BILITOT 0.1 (L) 03/07/2016 0555   GFRNONAA >60 03/07/2016 0555   GFRAA >60 03/07/2016 0555   Lipase     Component Value Date/Time   LIPASE 55 (H) 01/20/2016 0158       Studies/Results: No results found.  Anti-infectives: Anti-infectives    Start     Dose/Rate Route Frequency Ordered Stop   02/29/16 2000  cefTRIAXone (ROCEPHIN) 2 g in dextrose 5 % 50 mL IVPB     2 g 100 mL/hr over 30 Minutes Intravenous Every 24 hours 02/29/16 1804 03/02/16 2017   02/27/16 2000  cefTRIAXone (ROCEPHIN) 2 g in dextrose 5 % 50 mL IVPB  Status:  Discontinued     2 g 100 mL/hr over 30 Minutes Intravenous Every 24 hours 02/26/16 2137 02/29/16 1804       Assessment/Plan  EC fistula  - SBFT 1/16showsleak from GJ anastomosis approximately 30cm proximal to J tube tip - continues to tolerate continuous TF's at 39mL/hr - no output documented from eaken's pouch or bulb drain. Discussed importance of  I&O's with nurse - continue only liquid medications via J tube and no crushed meds - continue G-tube to gravity  FEN - NPO, IVF, TPN, TF at night VTE - Lovenox   Plan - with increase continous cyclic trickle TF to 15 mL/hr. Will continue to monitor. If he develops nausea or vomiting decrease TF's back to 10 mL.hr.    LOS: 10 days    Jerre SimonJessica L Kennice Finnie , [facility] 03/07/2016, 9:18 AM Pager: [phone] Consults: [phone] Mon-Fri 7:00 am-4:30 pm Sat-Sun 7:00 am-11:30 am

## 2016-03-07 NOTE — Progress Notes (Signed)
PHARMACY - TOTAL PARENTERAL NUTRITION CONSULT NOTE   Pharmacy Consult:  TPN Indication:  SBO and new EC fistula 02/13/16  Patient Measurements: Height: _0  (188 cm) Weight: 171 lb 15.3 oz (78 kg) IBW/kg (Calculated) : 82.2   Body mass index is 22.08 kg/m.  Assessment:  65 YOM with history of multiple procedures and extended hospitalization for recurrent partial GOO 2/2 pyloric stricture.  Patient was on TPN for several days, then discharged on 12/29/16 with J/G tube in place for TF. He was unable to tolerate TF and was transitioned back to TPN by [facility] on 01/05/17.  Patient was readmitted on 01/19/17 with partial SBO.  Patient failed MBS on 01/25/17 and did not tolerate bolus feeds through G-tube on 01/26/17. On 12/23, he underwent ex-lap with LoA, segmental SBR, and gastrojejunostomy. On 02/11/14, TF were found draining from midline incision with CT abd showing bowel perforation at [place] sit and development of abscesses.   GI: Prealbumin WNL at 24.  1/16 imaging showed EC fistula.  O/P 955m.  Bisacodyl PR, LBM 1/21 Endo: no hx DM - CBGs adequately controlled (CBGs 86-98 off TPN, 145-171 on TPN) Insulin requirements in the past 24 hours:  SSI d/c'ed 1/14 Lytes: slightly low CL, Mag 1.6, others WNL Renal: SCr 0.6, CrCL 87 ml/min - low UOP 0.3 ml/kg/hr Pulm: stable on RA - nicotine patch Cards: orthostatic hypotension improved  Hepatobil: LFTs WNL except alk phos.  TG WNL. Neuro: Prozac, Xanax, lidocaine patch, PRN morphine ID: s/p CTX 1/13 > 1/17 for UTI - afebrile, WBC 10.9 Best Practices: Lovenox, MC TPN Access: PICC TPN start date: PTA > [facility] 01/20/16 > [facility] 1/12  Nutritional Goals: 2100 - 2300 kCal and 95 - 110 g protein per day  Home TPN: 2156 kCal and 75gm protein per day (405g CHO = 1376 kCal, 50g lipid = 480 kCal)  Current Nutrition:  Jevity 1.2 at 10 ml/hr for 16 hrs >> increase to 15 ml/hr (288 kCal, 13gm protein) Clinimix without lipid (providing 2040 kCal, 85g  protein)   Plan:  - Jevity 1.2 increased to 15 ml/hr over 16 hours (1600 to 0800).   - Continue cyclic Clinimix E 40.09/38 infuse 2000 mls over 12 hrs:  50 ml/hr x 1 hr, then 190 ml/hr x 10 hrs, then 50 mL/hr x 1 hr. - Hold lipid to avoid overfeeding - Clinimix and TF will provide 2328 kCal and 98g protein a day, meeting ~100% of needs - Multivitamin PT daily.  Trace elements every other day b/c med on national backorder (next 1/23 - doesn't need since on TF but will give on days when other TPN patients are getting it). - Mag sulfate 2gm IV x 1.  Recheck labs on Wed. - F/U with TF advancement to wean off of TPN   Danny Beynon D. DMina Marble PharmD, BCPS Pager:  [phone]/22/2018, 10:05 AM

## 2016-03-07 NOTE — Progress Notes (Signed)
Physical Therapy Session Note  Patient Details  Name: [name] MRN: [id number] Date of Birth: 03-17-1940  Today's Date: 03/07/2016 PT Individual Time: 0800-0900 PT Individual Time Calculation (min): 60 min   Short Term Goals: Week 2:  PT Short Term Goal 1 (Week 2): Patient will perform bed mobility with supervision consistently PT Short Term Goal 2 (Week 2): Patient will perform sit-to-stand and stand pivot transfers using RW with min A and minimal cues   PT Short Term Goal 3 (Week 2): Patient will ambulate 75 ft with RW and min A PT Short Term Goal 4 (Week 2): Patient will maintain standing balance with min A for 3 minutes PT Short Term Goal 5 (Week 2): Patient will have initiated stair training   Skilled Therapeutic Interventions/Progress Updates:   Pt in bed upon arrival for PT session. Working on bed mobility for sitting EOB as well as donning pants. BP taken as noted in vitals section. Pt reports feeling dizzy with transitions from supine to sitting and to standing. Additional time and support needed for sitting balance initially but improving with time. Able to ambulate 6 ft from bed to w/c with multimodal cues for safety. Repeating sit/stand transfers during session but pt refusing to attempt ambulation due to reports of feeling dizzy (improved with sitting). Pt unable to tolerate static standing long enough to take standing BP. Incorporating general LE strengthening into rest breaks (LAQ, hip flexion, hip abd/add). Pt up in w/c with belt applied and call light in reach following session.   Therapy Documentation Precautions:  Precautions Precautions: Fall Precaution Comments: NPO, gastrostomy tube, LLQ JP drain, abdominal surgical incision with pouch Restrictions Weight Bearing Restrictions: No General:   Vital Signs: BP:  117/49 supine  116/46 sitting  119/55 sitting end of session.    See Function Navigator for Current Functional Status.   Therapy/Group:  Individual Therapy  Delton SeeBenjamin Shemaiah Round, PT, CSCS 03/07/2016, 12:44 PM

## 2016-03-07 NOTE — Progress Notes (Addendum)
Reported feeling better, no further gagging, stomach better  but "still upset" and nausea has subsided, resting comfortably. Pamelia HoitSharp, Snow Peoples B

## 2016-03-07 NOTE — Progress Notes (Signed)
Speech Language Pathology Daily Session Note  Patient Details  Name: [name] MRN: [id number] Date of Birth: Sep 09, 1940  Today's Date: 03/07/2016 SLP Individual Time: [id number] SLP Individual Time Calculation (min): 28 min  Short Term Goals: Week 1: SLP Short Term Goal 1 (Week 1): Pt will utilize speech intelligibility strategies at the pharse level with Mod A verbal cues to achieve 75% intelligibility.  SLP Short Term Goal 2 (Week 1): Pt will complete basic, familiar tasks iwth Mod A verbal cues for functional problem solving.  SLP Short Term Goal 3 (Week 1): Pt will sustain attention to functional tasks for ~10 minutes with Mod A verbal cues.  SLP Short Term Goal 4 (Week 1): Pt will utilize external memory aids to recall new, daily information with Mod A verbal cues.  SLP Short Term Goal 5 (Week 1): Pt will consistently demonstrate O x 4 with Mod A verbal cues.   Skilled Therapeutic Interventions:  Pt was seen for skilled ST targeting communication goals. Pt's vocal intensity was improved today in comparison to previous therapy session and he was intelligible at the sentence level with min assist verbal cues for increased vocal intensity and overarticulation.  Pt returned demonstration of incentive spirometer to facilitate improved respiratory drive for maintenance of increased vocal intensity for x20 repetitions at 500 mL.  Pt was left in bed with wife at bedside, handed off to OT.  Continue per current plan of care.    Of note, per discussion with MD pt is still on strict bowel rest.  Therapist often offers pt trials of ice chips during therapy sessions which pt usually declines.  Educated pt and wife on the importance of consuming ice chips periodically following oral care to prevent disuse atrophy as bowel rest will likely last 3-6 months per surgical team.       Function:  Eating Eating                 Cognition Comprehension Comprehension assist level: Understands  basic 90% of the time/cues < 10% of the time  Expression   Expression assist level: Expresses basic 75 - 89% of the time/requires cueing 10 - 24% of the time. Needs helper to occlude trach/needs to repeat words.  Social Interaction Social Interaction assist level: Interacts appropriately 75 - 89% of the time - Needs redirection for appropriate language or to initiate interaction.  Problem Solving Problem solving assist level: Solves basic 50 - 74% of the time/requires cueing 25 - 49% of the time  Memory Memory assist level: Recognizes or recalls 50 - 74% of the time/requires cueing 25 - 49% of the time    Pain Pain Assessment Pain Assessment: No/denies pain  Therapy/Group: Individual Therapy  Marquice Uddin, Melanee SpryNicole L 03/07/2016, 3:54 PM

## 2016-03-07 NOTE — Progress Notes (Signed)
Physical Therapy Session Note  Patient Details  Name: [name] MRN: [id number] Date of Birth: 23-Jun-1940  Today's Date: 03/07/2016 PT Individual Time: [phone] PT Individual Time Calculation (min): 29 min   Short Term Goals: Week 2:  PT Short Term Goal 1 (Week 2): Patient will perform bed mobility with supervision consistently PT Short Term Goal 2 (Week 2): Patient will perform sit-to-stand and stand pivot transfers using RW with min A and minimal cues   PT Short Term Goal 3 (Week 2): Patient will ambulate 75 ft with RW and min A PT Short Term Goal 4 (Week 2): Patient will maintain standing balance with min A for 3 minutes PT Short Term Goal 5 (Week 2): Patient will have initiated stair training       Therapy Documentation Precautions:  Precautions Precautions: Fall Precaution Comments: NPO, gastrostomy tube, LLQ JP drain, abdominal surgical incision with pouch Restrictions Weight Bearing Restrictions: No General:   Vital Signs: Therapy Vitals Temp: 98.8 F (37.1 C) Temp Source: Oral Pulse Rate: 94 Resp: 18 BP: (!) 123/47 Patient Position (if appropriate): Lying Oxygen Therapy SpO2: 98 % O2 Device: Not Delivered Pain: Patient denies any pain.  Short sit to and from supine in bed min assist Rolling to the left and right supervision with use of bedrail. short sit to supine mod assist   Stand pivot transfer to and from bed and wheelchair min assist  Ambulatory transfer with RW min assist; mod assist from a low surface.  Patient ambulated 35 feet with use of RW. Patient ambulated with a step through gait pattern. Patient required min assist. Patient anxious  Patient required verbal and tactile cues for upright posture, increased step length and RW management.    Patient returned to bed at end of session with all needs met. Patient reported increased fatigue from previous sessions. Patient required rest breaks after all acticity. Patient vitals monitored and  remained stable throughout session.    See Function Navigator for Current Functional Status.    Therapy/Group: Individual Therapy  Retta Diones 03/07/2016, 2:47 PM

## 2016-03-07 NOTE — Progress Notes (Signed)
Pt reported back pain and nausea. Gagging and heaving p coughing up thick secretions, stated doesnt feel well, stomach upset. Given pain medication and compazine, repositioned in bed, TF decreased back to 10 cc/hr. Pamelia HoitSharp, Lareen Mullings B

## 2016-03-08 ENCOUNTER — Inpatient Hospital Stay (HOSPITAL_COMMUNITY): Payer: Medicare Other | Admitting: Occupational Therapy

## 2016-03-08 ENCOUNTER — Inpatient Hospital Stay (HOSPITAL_COMMUNITY): Payer: Medicare Other | Admitting: Speech Pathology

## 2016-03-08 ENCOUNTER — Inpatient Hospital Stay (HOSPITAL_COMMUNITY): Payer: Medicare Other | Admitting: Physical Therapy

## 2016-03-08 LAB — GLUCOSE, CAPILLARY
GLUCOSE-CAPILLARY: 157 mg/dL — AB (ref 65–99)
Glucose-Capillary: 153 mg/dL — ABNORMAL HIGH (ref 65–99)
Glucose-Capillary: 106 mg/dL — ABNORMAL HIGH (ref 65–99)
Glucose-Capillary: 103 mg/dL — ABNORMAL HIGH (ref 65–99)
Glucose-Capillary: 81 mg/dL (ref 65–99)

## 2016-03-08 MED ORDER — ERYTHROMYCIN ETHYLSUCCINATE 200 MG/5ML PO SUSR
200.0000 mg | Freq: Three times a day (TID) | ORAL | Status: DC
  Administered 2016-03-08 – 2016-03-14 (×16): 200 mg
  Filled 2016-03-08 (×19): qty 5

## 2016-03-08 MED ORDER — FAT EMULSION 20 % IV EMUL
240.0000 mL | INTRAVENOUS | Status: AC
  Administered 2016-03-08: 240 mL via INTRAVENOUS
  Filled 2016-03-08: qty 250

## 2016-03-08 MED ORDER — CLINIMIX E/DEXTROSE (4.25/25) 4.25 % IV SOLN
INTRAVENOUS | Status: AC
  Administered 2016-03-08: 18:00:00 via INTRAVENOUS
  Filled 2016-03-08: qty 2000

## 2016-03-08 NOTE — Progress Notes (Signed)
PHARMACY - TOTAL PARENTERAL NUTRITION CONSULT NOTE   Pharmacy Consult:  TPN Indication:  SBO and new EC fistula 02/13/16  Patient Measurements: Height: 6' 2"  (188 cm) Weight: 158 lb (71.7 kg) IBW/kg (Calculated) : 82.2   Body mass index is 20.29 kg/m.  Assessment:  25 YOM with history of multiple procedures and extended hospitalization for recurrent partial GOO 2/2 pyloric stricture.  Patient was on TPN for several days, then discharged on 12/29/16 with J/G tube in place for TF. He was unable to tolerate TF and was transitioned back to TPN by [facility] on 01/05/17.  Patient was readmitted on 01/19/17 with partial SBO.  Patient failed MBS on 01/25/17 and did not tolerate bolus feeds through G-tube on 01/26/17. On 12/23, he underwent ex-lap with LoA, segmental SBR, and gastrojejunostomy. On 02/11/14, TF were found draining from midline incision with CT abd showing bowel perforation at [place] sit and development of abscesses.   GI: Prealbumin WNL at 24.  1/16 imaging showed EC fistula.  Drain O/P 1772m.  Bisacodyl PR, LBM 1/21.  Unable to advance TF to 15 ml/hr Endo: no hx DM - CBGs adequately controlled (CBGs 91-100 off TPN, 153-164 on TPN) Insulin requirements in the past 24 hours:  SSI d/c'ed 1/14 Lytes: 1/22 labs - slightly low CL, Mag 1.6, others WNL Renal: SCr 0.6, CrCL 87 ml/min - low UOP 0.2 ml/kg/hr Pulm: stable on RA - nicotine patch Cards: orthostatic hypotension improved  Hepatobil: LFTs WNL except alk phos.  TG WNL. Neuro: Prozac, Xanax, lidocaine patch, PRN morphine ID: s/p CTX 1/13 > 1/17 for UTI - afebrile, WBC 10.9 Best Practices: Lovenox, MC TPN Access: PICC TPN start date: PTA > [facility] 01/20/16 > [facility] 1/12  Home TPN: 2156 kCal and 75gm protein per day (405g CHO = 1376 kCal, 50g lipid = 480 kCal)  Nutritional Goals: 2100 - 2300 kCal and 95 - 110 g protein per day  Current Nutrition:  Jevity 1.2 at 10 ml/hr for 16 hrs (192 kCal, 9 gm protein) Clinimix + lipid twice weekly  (daily avg of = 2177 kCal, 85g protein)   Plan:  - Jevity 1.2 back at 10 ml/hr over 16 hours (1600 to 0800).   - Continue cyclic Clinimix E 44.16/38 infuse 2000 mls over 12 hrs:  50 ml/hr x 1 hr, then 190 ml/hr x 10 hrs, then 50 mL/hr x 1 hr. - Lipid 20 ml/hr x 12 hrs on Tues and Thurs only to minimize kCal provision. - Clinimix and TF at 10 ml/hr will provide a weekly avg of 2369 kCal and 94g protein per day, meeting ~100% of needs - Multivitamin PT daily.  Trace elements every other day because med is on national backorder (give today). - BMET / Mag in AM   [name] D. DMina Marble PharmD, BCPS Pager:  [phone]/23/2018, 10:50 AM

## 2016-03-08 NOTE — Progress Notes (Signed)
Social Work Patient ID: [name], male   DOB: 12/31/1940, 75 y.o.   MRN: [id number]  Met with daughter and wife who had questions regarding pt's progress and wanted to see MD to ask medical questions. Have asked Pam-PA to see them and answer any medical questions. She planned to see them in the pt's room.  

## 2016-03-08 NOTE — Consult Note (Signed)
WOC nurse follow up Patient with large Eakins pouch to bedside drainage bag. Last changed on Thursday 03/03/16 by one of my WOC nurse partners.  Very minimal output in the pouch, liquid light brown. Patient up in the wheelchair. Pouch intact.  Supplies ordered for next pouch change most likely on Thursday.  New pattern for Eakins pouch on the wall in the patient's room.  Patient more awake today and conversational.  No family in the room.   WOC team will follow along with you for support with care of EC fistula.  Xaiden Fleig [facility]p [facility] MSN, RN,CWOCN, CNS [phone]

## 2016-03-08 NOTE — Progress Notes (Signed)
[place] PHYSICAL MEDICINE & REHABILITATION     PROGRESS NOTE  Subjective/Complaints: Pt laying in bed this AM.  He slept well overnight.  He indicates he is tolerating the tube feeds.  ROS: Denies vomiting, diarrhea, shortness of breath or chest pain  Objective: Vital Signs: Blood pressure (!) 115/50, pulse 86, temperature 98.6 F (37 C), temperature source Oral, resp. rate 18, height 6\' 2"  (1.88 m), weight 71.7 kg (158 lb), SpO2 98 %. No results found.  Recent Labs  03/07/16 0555  WBC 10.9*  HGB 8.4*  HCT 26.1*  PLT 365    Recent Labs  03/07/16 0555  NA 135  K 3.7  CL 109  GLUCOSE 74  BUN 27*  CREATININE 0.60*  CALCIUM 8.0*   CBG (last 3)   Recent Labs  03/07/16 2350 03/08/16 0414 03/08/16 0826  GLUCAP 164* 153* 106*    Wt Readings from Last 3 Encounters:  03/08/16 71.7 kg (158 lb)  02/26/16 78.6 kg (173 lb 4.5 oz)  01/19/16 79.2 kg (174 lb 8 oz)    Physical Exam:  BP (!) 115/50 (BP Location: Left Arm)   Pulse 86   Temp 98.6 F (37 C) (Oral)   Resp 18   Ht 6\' 2"  (1.88 m)   Wt 71.7 kg (158 lb)   SpO2 98%   BMI 20.29 kg/m  Constitutional: NAD. Vital signs reviewed. Frail.  HENT: Normocephalic and atraumatic. +Stoma. Eyes: EOMI. No discharge.  Cardiovascular: RRR. No JVD. Respiratory: CTA bilaterally. Unlabored.  GI: He exhibits no distension. BS+. +Eakins pouch  LUQ drain  LLQ drain  Musculoskeletal: He exhibits no edema. He exhibits no tenderness.  Neurological: He is alert.  Sitting balance is good Speech with dysarthria, improving  Motor: 4-/5 throughout (unchanged) Skin: Skin is warm and dry. Abdominal wounds with dressing.   Assessment/Plan: 1. Functional deficits secondary to debility and encephalopathy which require 3+ hours per day of interdisciplinary therapy in a comprehensive inpatient rehab setting. Physiatrist is providing close team supervision and 24 hour management of active medical problems listed below. Physiatrist and  rehab team continue to assess barriers to discharge/monitor patient progress toward functional and medical goals.  Function:  Bathing Bathing position Bathing activity did not occur: Refused Position: Bed  Bathing parts Body parts bathed by patient: Right arm, Left arm, Chest Body parts bathed by helper: Left lower leg, Right lower leg, Left upper leg, Right upper leg  Bathing assist Assist Level:  (Total A)      Upper Body Dressing/Undressing Upper body dressing Upper body dressing/undressing activity did not occur: Safety/medical concerns What is the patient wearing?: Pull over shirt/dress     Pull over shirt/dress - Perfomed by patient: Thread/unthread right sleeve, Thread/unthread left sleeve Pull over shirt/dress - Perfomed by helper: Put head through opening, Pull shirt over trunk        Upper body assist Assist Level: Touching or steadying assistance(Pt > 75%)      Lower Body Dressing/Undressing Lower body dressing Lower body dressing/undressing activity did not occur: Safety/medical concerns What is the patient wearing?: Pants, Ted Hose, Non-skid slipper socks       Pants- Performed by helper: Thread/unthread right pants leg, Thread/unthread left pants leg, Pull pants up/down   Non-skid slipper socks- Performed by helper: Don/doff right sock, Don/doff left sock   Socks - Performed by helper: Don/doff right sock, Don/doff left sock           TED Hose - Performed by helper: Don/doff left  TED hose, Don/doff right TED hose  Lower body assist Assist for lower body dressing: 2 Helpers      Toileting Toileting Toileting activity did not occur: N/A Toileting steps completed by patient: Performs perineal hygiene Toileting steps completed by helper: Adjust clothing prior to toileting, Performs perineal hygiene, Adjust clothing after toileting Toileting Assistive Devices: Grab bar or rail  Toileting assist Assist level: More than reasonable time, Touching or steadying  assistance (Pt.75%)   Transfers Chair/bed transfer   Chair/bed transfer method: Ambulatory, Stand pivot Chair/bed transfer assist level: Moderate assist (Pt 50 - 74%/lift or lower) Chair/bed transfer assistive device: Armrests, Patent attorneyWalker     Locomotion Ambulation     Max distance: 35 Assist level: Touching or steadying assistance (Pt > 75%)   Wheelchair   Type: Manual Max wheelchair distance: 40 ft Assist Level: Supervision or verbal cues  Cognition Comprehension Comprehension assist level: Understands complex 90% of the time/cues 10% of the time  Expression Expression assist level: Expresses basic 75 - 89% of the time/requires cueing 10 - 24% of the time. Needs helper to occlude trach/needs to repeat words.  Social Interaction Social Interaction assist level: Interacts appropriately 75 - 89% of the time - Needs redirection for appropriate language or to initiate interaction.  Problem Solving Problem solving assist level: Solves basic 50 - 74% of the time/requires cueing 25 - 49% of the time  Memory Memory assist level: Recognizes or recalls 50 - 74% of the time/requires cueing 25 - 49% of the time    Medical Problem List and Plan: 1. Debility and encephalopathy secondary to small bowel obstruction and associated complications   Cont CIR   Surgery evaluated, TFs per Surgery   No crushed meds through feeding tube 2. DVT Prophylaxis/Anticoagulation: Pharmaceutical: Lovenox  3. Pain Management: Changed PCA to IV morphine every 4 hours prn severe pain   pain in general has been better controlled. Pt aware there will be ebbs and flows 4. Mood: team to provide ego support. LCSW to follow for evaluation and support.   Prozac started 1/18 5. Neuropsych: This patient is not fully capable of making decisions on his own behalf.  6. Skin/Wound Care: Continue air mattress overlay. Continue large Eakin's pouch to manage wound drainage. WOC for management. Encourage side lying to help manage  MASD.   -I see no changes in abdomen or pouch/drains 7. Malnutrition/Fluids/Electrolytes/Nutrition: continue TPN.  8. Abdominal abscess: Leucocytosis resolving. Completed rocephin 1/17 9. Anemia of chronic illness:   Hb 8.4 on 1/22 (stable)  Cont to monitor  Hemoccult + 10. Electrolyte abnormality/Acute renal insufficiency: Managed by TNA   Spoke to pharmacy regarding increasing Mag and protein 11. Encephalopathy: Changed haldol to IM prn for management of agitation/anxiety.  12. Hyperglycemia  Fairly controlled 1/23 13. Leukocytosis: Stable  WBC 10.9 on 03/07/2016  Afebrile  Ucx NG 14. Orthostasis: Improved 15. Sleep disturbance  Has tried Haldol without benefit, ativan causes hallucinations  Continue Xanax elixer, no meds per tube  LOS (Days) 11 A FACE TO FACE EVALUATION WAS PERFORMED  [name] 03/08/2016 8:45 AM

## 2016-03-08 NOTE — Progress Notes (Signed)
Speech Language Pathology Weekly Progress and Session Note  Patient Details  Name: [name] MRN: [id number] Date of Birth: 06-23-1940  Beginning of progress report period: March 01, 2016  End of progress report period: March 08, 2016   Today's Date: 03/08/2016 SLP Individual Time: 1100-1200 SLP Individual Time Calculation (min): 60 min  Short Term Goals: Week 1: SLP Short Term Goal 1 (Week 1): Pt will utilize speech intelligibility strategies at the pharse level with Mod A verbal cues to achieve 75% intelligibility.  SLP Short Term Goal 1 - Progress (Week 1): Met SLP Short Term Goal 2 (Week 1): Pt will complete basic, familiar tasks iwth Mod A verbal cues for functional problem solving.  SLP Short Term Goal 2 - Progress (Week 1): Met SLP Short Term Goal 3 (Week 1): Pt will sustain attention to functional tasks for ~10 minutes with Mod A verbal cues.  SLP Short Term Goal 3 - Progress (Week 1): Progressing toward goal SLP Short Term Goal 4 (Week 1): Pt will utilize external memory aids to recall new, daily information with Mod A verbal cues.  SLP Short Term Goal 4 - Progress (Week 1): Progressing toward goal SLP Short Term Goal 5 (Week 1): Pt will consistently demonstrate O x 4 with Mod A verbal cues.  SLP Short Term Goal 5 - Progress (Week 1): Met    New Short Term Goals: Week 2: SLP Short Term Goal 1 (Week 2): Pt will utilize speech intelligibility strategies at the sentence level with Min A verbal cues to achieve intelligibility.  SLP Short Term Goal 2 (Week 2): Pt will complete basic, familiar tasks iwth Min A verbal cues for functional problem solving.  SLP Short Term Goal 3 (Week 2): Pt will sustain attention to functional tasks for ~10 minutes with Mod A verbal cues.  SLP Short Term Goal 4 (Week 2): Pt will utilize external memory aids to recall new, daily information with Mod A verbal cues.  SLP Short Term Goal 5 (Week 2): Pt will consistently demonstrate O x 4 with  supervision verbal cues.   Weekly Progress Updates: Pt has made slow functional gains this reporting period and has met 3 out of 5 short term goals.  Pt is currently an overall mod assist for intelligibility at the phrase/sentence level due to decreased vocal intensity and oral motor weakness.  Pt also needs mod assist during basic tasks due to moderate cognitive impairments characterized by impairments of recall of daily information,  functional problem solving, and awareness of deficits.  Pt's mentation and speech intelligibility fluctuate depending on fatigue.  Pt and family education is ongoing.  Pt would continue to benefit from skilled ST while inpatient in order to maximize functional independence and reduce burden of care prior to discharge.      Intensity: Minumum of 1-2 x/day, 30 to 90 minutes Frequency: 3 to 5 out of 7 days Duration/Length of Stay: 14 to 18 Treatment/Interventions: Cognitive remediation/compensation;Cueing hierarchy;Speech/Language facilitation;Multimodal communication approach;Patient/family education;Therapeutic Activities   Daily Session  Skilled Therapeutic Interventions: Pt was seen for skilled ST targeting cognitive goals.  SLP facilitated the session with a novel card game to address memory and problem solving.  Pt initially required mod assist verbal cues for working memory of task protocols and procedures in order to plan and execute a problem solving strategy; however, as task progressed and with repetition of information therapist was able to fade cues to supervision verbal cues.  Pt was returned to room and transferred back  to bed with wife and daughter at bedside.  Goals updated on this date to reflect current progress and plan of care.       Function:   Eating Eating                 Cognition Comprehension Comprehension assist level: Understands basic 90% of the time/cues < 10% of the time  Expression   Expression assist level: Expresses basic  50 - 74% of the time/requires cueing 25 - 49% of the time. Needs to repeat parts of sentences.  Social Interaction Social Interaction assist level: Interacts appropriately 50 - 74% of the time - May be physically or verbally inappropriate.  Problem Solving Problem solving assist level: Solves basic 50 - 74% of the time/requires cueing 25 - 49% of the time  Memory Memory assist level: Recognizes or recalls 50 - 74% of the time/requires cueing 25 - 49% of the time   General    Pain Pain Assessment Pain Assessment: No/denies pain  Therapy/Group: Individual Therapy  [name], Selinda Orion 03/08/2016, 2:57 PM

## 2016-03-08 NOTE — Progress Notes (Signed)
Occupational Therapy Session Note  Patient Details  Name: [name] MRN: [name] Date of Birth: 1940/09/30  Today's Date: 03/08/2016 OT Individual Time: 1717-1820 OT Individual Time Calculation (min): 63 min    Short Term Goals: Week 2:  OT Short Term Goal 1 (Week 2): Pt will complete bathing with mod assist at sit > stand level OT Short Term Goal 2 (Week 2): Pt will tolerate 5 minutes of standing activity in preparation for ADL tasks OT Short Term Goal 3 (Week 2): Pt will complete LB dressing with Mod A  Skilled Therapeutic Interventions/Progress Updates: though patient complained of fatigue, he concurred to work with extra rest breaks as follows:  Bed mobility to increase independence in bed and assist nursing with pain patch care when he is supine = Mod assistance    (patien required extra time and prompting to try and initiate the task)  Supine with head of bed elevated to Edge of bed transfer = Mod tactile and verbal cues to initiate and for technique and Moderate assistance with extra time  Patient was able to sit edge of bed with fair balance and complete low level endurance activities, with numerous rest breaks.   Patient grimaced upon this clinician helping him get to the edge of bed to put feet on floor and in prep for standing.     * when asked if he was fearful of falling, he stated, "Yes."  As well, he required total assist to don abdominal binder (to help decrease dizziness and drops in blood pressure upon standing)  His BP taken by nurse tech right before session started was 114/47 (patient supine in bed with head of bed elevated about 30 degrees)  He was able to complete sit to stand with CGA and encouragement  Patient was able to take maintain dynamic standing balance and functional mobility of 2 side steps to his right in order to be higher up in bed upon sitting again = CGA  So, with the aforementioned and standing balance (dynamic and static), patient was  able to tolerate about 30 seconds standing/side step combo before starting to sit down (due to fatigue and fear of falling)  He required more asisstance to transfer edge of bed to supine with head of bed elevated with max A (due to fatigued and decreased core and  lower extremity strength)  Patient exhibited somewhat rigid movements.  As well, patient was able to sustain attention to tasks during the activities; however, he was neither able to recall how he has completed supine to edge of bed transfers with this previous therapists, nor how many staff members are required to asssist him to the edge of bed  He was able to recall/demonstrate call bell use during the session.  IV nurse and his nurse came in to provide patient care during the session.  At end of session, patient was left supine with head of bed elevated approximately 30 degrees and with call bell in place.  He stated he did not want the phone near and that he does not ever answer or talk on his phone.  Continue with primary therapists     Therapy Documentation Precautions:  Precautions Precautions: Fall Precaution Comments: NPO, gastrostomy tube, LLQ JP drain, abdominal surgical incision with pouch Restrictions Weight Bearing Restrictions: No  Pain: Pain Assessment Pain Assessment: No/denies pain  See Function Navigator for Current Functional Status.   Therapy/Group: Individual Therapy  Rozelle Loganickett, Cristen Murcia Yeary 03/08/2016, 6:39 PM

## 2016-03-08 NOTE — Progress Notes (Signed)
Physical Therapy Session Note  Patient Details  Name: Danny GalRalph T Filsinger MRN: [id number] Date of Birth: Aug 13, 1940  Today's Date: 03/08/2016 PT Individual Time: 0900-1003 and 14:45-15:15 PT Individual Time Calculation (min): 63 min and 30 min  Short Term Goals: Week 2:  PT Short Term Goal 1 (Week 2): Patient will perform bed mobility with supervision consistently PT Short Term Goal 2 (Week 2): Patient will perform sit-to-stand and stand pivot transfers using RW with min A and minimal cues   PT Short Term Goal 3 (Week 2): Patient will ambulate 75 ft with RW and min A PT Short Term Goal 4 (Week 2): Patient will maintain standing balance with min A for 3 minutes PT Short Term Goal 5 (Week 2): Patient will have initiated stair training   Skilled Therapeutic Interventions/Progress Updates:  Session 1:  Pt was found in bed and complained of no pain. Pt initially refused therapy to proceeded having an bowel movement in bed without awareness to notify the nursing staff. Nurse tech made aware. Pt was educated on the need to call nursing when he has to urge so they can help him transfer to the toilet.   Pt required mod A to transfer from supine to sit.  Pt required touching assist with standing from seated position but required min A with lowering throughout therapy. Verbal cues for timing and sequence were required.  Pt performed ambulation transfer Min A for 12 ft and 10 ft with RW to toilet and back to W/C with max cues for handplacement and proper use of DME.  Pt tolerated 3 bouts of standing tolerance of 2 min each min A with BUE support with RW and handrails during toileting to clean after bowel movement and replace foam dressing. Pt required total assistance to remove and don  brief.   Pt verbalized having 6/10 dizziness when sitting on commode.  Pt performed therapeutic exercises in W/C that included  - alternating hight knees 1x10  - LAQ with 5 sec hold bilaterally 1x10  - Ankle pumps 2  x 10  Pt performed one episode of standing tolerance for 1 min 25 sec with RW and min A to maintain upright position  Pt was left in W/C with all needs within reach.   Session 2: Pt was found in bed with wife and daughter present. Pt was complaining of no pain. Pt performed supine to sit Mod A with HOB elevated, use of handrails, and cues for sequence and handplacement.  Patient ambulated 80 ft and 60 ft Min A with RW and a +2 to pull W/C and IV pole along.   Sit-to-stand transfers during therapy were min A for lowering only with verbal cues for handplacement and sequence.   Pt complained of dizziness that at worse was 7/10 but lowered to 5/10 as therapy progressed.  Pt was left in W/C with all needs within reach.      Therapy Documentation Precautions:  Precautions Precautions: Fall Precaution Comments: NPO, gastrostomy tube, LLQ JP drain, abdominal surgical incision with pouch Restrictions Weight Bearing Restrictions: No   See Function Navigator for Current Functional Status.   Therapy/Group: Individual Therapy  Rudie MeyerJeffrey Clydia Nieves 03/08/2016, 9:51 AM

## 2016-03-09 ENCOUNTER — Inpatient Hospital Stay (HOSPITAL_COMMUNITY): Payer: Medicare Other | Admitting: Occupational Therapy

## 2016-03-09 ENCOUNTER — Inpatient Hospital Stay (HOSPITAL_COMMUNITY): Payer: Medicare Other | Admitting: Physical Therapy

## 2016-03-09 ENCOUNTER — Inpatient Hospital Stay (HOSPITAL_COMMUNITY): Payer: Medicare Other | Admitting: Speech Pathology

## 2016-03-09 DIAGNOSIS — E871 Hypo-osmolality and hyponatremia: Secondary | ICD-10-CM

## 2016-03-09 LAB — GLUCOSE, CAPILLARY
GLUCOSE-CAPILLARY: 102 mg/dL — AB (ref 65–99)
GLUCOSE-CAPILLARY: 148 mg/dL — AB (ref 65–99)
GLUCOSE-CAPILLARY: 157 mg/dL — AB (ref 65–99)
GLUCOSE-CAPILLARY: 176 mg/dL — AB (ref 65–99)
Glucose-Capillary: 75 mg/dL (ref 65–99)

## 2016-03-09 LAB — BASIC METABOLIC PANEL
ANION GAP: 5 (ref 5–15)
BUN: 27 mg/dL — ABNORMAL HIGH (ref 6–20)
CALCIUM: 8.1 mg/dL — AB (ref 8.9–10.3)
CHLORIDE: 107 mmol/L (ref 101–111)
CO2: 22 mmol/L (ref 22–32)
Creatinine, Ser: 0.57 mg/dL — ABNORMAL LOW (ref 0.61–1.24)
GFR calc Af Amer: >60 mL/min (ref >60)
GFR calc non Af Amer: >60 mL/min (ref >60)
Glucose, Bld: 162 mg/dL — ABNORMAL HIGH (ref 65–99)
POTASSIUM: 3.8 mmol/L (ref 3.5–5.1)
Sodium: 134 mmol/L — ABNORMAL LOW (ref 135–145)

## 2016-03-09 LAB — MAGNESIUM: Magnesium: 1.8 mg/dL (ref 1.7–2.4)

## 2016-03-09 MED ORDER — CLINIMIX E/DEXTROSE (4.25/25) 4.25 % IV SOLN
INTRAVENOUS | Status: AC
  Administered 2016-03-09: 18:00:00 via INTRAVENOUS
  Filled 2016-03-09: qty 2000

## 2016-03-09 MED ORDER — MAGNESIUM SULFATE IN D5W 1-5 GM/100ML-% IV SOLN
1.0000 g | Freq: Once | INTRAVENOUS | Status: AC
  Administered 2016-03-09: 1 g via INTRAVENOUS
  Filled 2016-03-09: qty 100

## 2016-03-09 MED ORDER — CLINIMIX E/DEXTROSE (4.25/25) 4.25 % IV SOLN: INTRAVENOUS | Status: DC

## 2016-03-09 MED ORDER — JEVITY 1.2 CAL PO LIQD
1000.0000 mL | Freq: Every day | ORAL | Status: DC
  Administered 2016-03-09 – 2016-03-13 (×5): 1000 mL
  Filled 2016-03-09: qty 237
  Filled 2016-03-09 (×6): qty 1000

## 2016-03-09 MED ORDER — CLINIMIX E/DEXTROSE (4.25/25) 4.25 % IV SOLN
INTRAVENOUS | Status: DC
  Filled 2016-03-09: qty 2000

## 2016-03-09 MED FILL — Medication: Qty: 1 | Status: AC

## 2016-03-09 NOTE — Progress Notes (Signed)
PHARMACY - TOTAL PARENTERAL NUTRITION CONSULT NOTE   Pharmacy Consult:  TPN Indication:  SBO and new EC fistula 02/13/16  Patient Measurements: Height: 6' 2"  (188 cm) Weight: 156 lb 3.8 oz (70.9 kg) IBW/kg (Calculated) : 82.2   Body mass index is 20.06 kg/m.  Assessment:  32 YOM with history of multiple procedures and extended hospitalization for recurrent partial GOO 2/2 pyloric stricture.  Patient was on TPN for several days, then discharged on 12/29/16 with J/G tube in place for TF. He was unable to tolerate TF and was transitioned back to TPN by [facility] on 01/05/17.  Patient was readmitted on 01/19/17 with partial SBO.  Patient failed MBS on 01/25/17 and did not tolerate bolus feeds through G-tube on 01/26/17. On 12/23, he underwent ex-lap with LoA, segmental SBR, and gastrojejunostomy. On 02/11/14, TF were found draining from midline incision with CT abd showing bowel perforation at [place] sit and development of abscesses.   GI: Prealbumin WNL at 24.  1/16 imaging showed EC fistula.  Drain O/P 870m.  Bisacodyl PR, LBM 1/21.  Unable to advance TF to 15 ml/hr, erythromycin started 1/23 Endo: no hx DM - CBGs adequately controlled (CBGs 81-106 off TPN, 148-162 on TPN) Insulin requirements in the past 24 hours:  SSI d/c'ed 1/14 Lytes:  slightly low Na, others WNL Renal: SCr 0.57, CrCL 87 ml/min - low UOP 0.3 ml/kg/hr Pulm: stable on RA - nicotine patch Cards: orthostatic hypotension improved  Hepatobil: LFTs WNL except alk phos.  TG WNL. Neuro: Prozac, Xanax, lidocaine patch, PRN morphine ID: s/p CTX 1/13 > 1/17 for UTI - afebrile, WBC 10.9 Best Practices: Lovenox, [facility] TPN Access: PICC TPN start date: [facility] > [facility] 01/20/16 > [facility] 1/12  Home TPN: 2156 kCal and 75gm protein per day (405g CHO = 1376 kCal, 50g lipid = 480 kCal)  Nutritional Goals: 2100 - 2300 kCal and 95 - 110 g protein per day  Current Nutrition:  Jevity 1.2 at 10 ml/hr for 16 hrs (192 kCal, 9 gm protein) Clinimix + lipid  twice weekly (daily average of 2177 kCal, 85g protein)   Plan:  - Jevity 1.2 at 10 ml/hr over 16 hours (1600 to 0800).  Charted as infusing at 15 ml/hr per order but actually infusing at 10 ml/hr, confirmed with RN. - Continue cyclic Clinimix E 49.83/38 infuse 2000 mls over 12 hrs:  50 ml/hr x 1 hr, then 190 ml/hr x 10 hrs, then 50 mL/hr x 1 hr. - Lipid 20 ml/hr x 12 hrs on Tues and Thurs only to minimize kCal provision. - Clinimix and TF at 10 ml/hr will provide a weekly avg of 2369 kCal and 94g protein per day, meeting ~100% of needs - Multivitamin PT daily.  Trace elements every other day because med is on national backorder (next 1/25). - Mag 1gm IV x 1 - F/U standard AM labs   Icey Tello D. DMina Marble PharmD, BCPS Pager:  [phone]/24/2018, 10:30 AM

## 2016-03-09 NOTE — Progress Notes (Signed)
Physical Therapy Session Note  Patient Details  Name: [name] MRN: [id number] Date of Birth: [birth year]  Today's Date: 03/09/2016 PT Individual Time: 1106-1201 PT Individual Time Calculation (min): 55 min   Short Term Goals: Week 2:  PT Short Term Goal 1 (Week 2): Patient will perform bed mobility with supervision consistently PT Short Term Goal 2 (Week 2): Patient will perform sit-to-stand and stand pivot transfers using RW with min A and minimal cues   PT Short Term Goal 3 (Week 2): Patient will ambulate 75 ft with RW and min A PT Short Term Goal 4 (Week 2): Patient will maintain standing balance with min A for 3 minutes PT Short Term Goal 5 (Week 2): Patient will have initiated stair training  Skilled Therapeutic Interventions/Progress Updates:  Pt was sitting in W/C upon arrival. Pt was complaining of no pain starting and throughout therapy.  Pt self-propelled in W/C 80 ft with supervision and verbal cues for maneuvering in hallway before fatiguing.  Pt required CGA with lifting and min A lowering with stand-to-sit and stand pivot transfer throughout therapy with frequent verbal cues for weight shifting and handplacement.   Pt ambulated 90 ft Min A with RW . Verbal cues for sequence and proper use of DME was required.  Pt ascended forward and descended backwards six 3 inch step with mod A and with bilateral rails. Demonstration and verbal cues for proper sequence was required.   Pt demonstrated 1 car transfer. To enter car, pt required min A with one leg only. To exit, pt required supervision. Max verbal cues for sequence and handplacement was required both ways.   Pt was rolled back to room in W/C. Pt was left in room sitting in W/C with all needs within reach and with family. Pt complained of dizziness of 6/10 that remained steady throughout therapy.      Therapy Documentation Precautions:  Precautions Precautions: Fall Precaution Comments: NPO, gastrostomy tube, LLQ  JP drain, abdominal surgical incision with pouch Restrictions Weight Bearing Restrictions: No   See Function Navigator for Current Functional Status.   Therapy/Group: Individual Therapy  [name] 03/09/2016, 7:41 AM

## 2016-03-09 NOTE — Progress Notes (Signed)
[facility] Progress Note     Subjective: No complaints of pain. Just tired.  Objective: Vital signs in last 24 hours: Temp:  [97.5 F (36.4 C)-98.4 F (36.9 C)] 97.5 F (36.4 C) (01/24 0400) Pulse Rate:  [76-90] 90 (01/24 0400) Resp:  [17-18] 18 (01/24 0400) BP: (110-116)/(45-47) 110/45 (01/24 0400) SpO2:  [99 %] 99 % (01/24 0400) Weight:  [156 lb 3.8 oz (70.9 kg)] 156 lb 3.8 oz (70.9 kg) (01/24 0400) Last BM Date: 03/09/16  Intake/Output from previous day: 01/23 0701 - 01/24 0700 In: 1560 [I.V.:820; NG/GT:140] Out: 1395 [Urine:500; Drains:895] Intake/Output this shift: Total I/O In: 40 [I.V.:30; NG/GT:10] Out: 400 [Urine:400]  PE: Gen: Alert, NAD, pleasant, sitting up in the chair Card: RRR, no M/G/R heard Pulm: rate and effort normal Abd: abdomen soft, non distended, +BS, eakins pouch in place with minimal drainage in bag, LLQ drain with scant serosanguinous fluid in bag, no TTP.    Lab Results:   Recent Labs  03/07/16 0555  WBC 10.9*  HGB 8.4*  HCT 26.1*  PLT 365   BMET  Recent Labs  03/07/16 0555 03/09/16 0507  NA 135 134*  K 3.7 3.8  CL 109 107  CO2 21* 22  GLUCOSE 74 162*  BUN 27* 27*  CREATININE 0.60* 0.57*  CALCIUM 8.0* 8.1*   PT/INR No results for input(s): LABPROT, INR in the last 72 hours. CMP     Component Value Date/Time   NA 134 (L) 03/09/2016 0507   NA 139 01/14/2016   K 3.8 03/09/2016 0507   CL 107 03/09/2016 0507   CO2 22 03/09/2016 0507   GLUCOSE 162 (H) 03/09/2016 0507   BUN 27 (H) 03/09/2016 0507   BUN 23 (A) 01/14/2016   CREATININE 0.57 (L) 03/09/2016 0507   CALCIUM 8.1 (L) 03/09/2016 0507   PROT 5.8 (L) 03/07/2016 0555   ALBUMIN 1.6 (L) 03/07/2016 0555   AST 33 03/07/2016 0555   ALT 37 03/07/2016 0555   ALKPHOS 145 (H) 03/07/2016 0555   BILITOT 0.1 (L) 03/07/2016 0555   GFRNONAA >60 03/09/2016 0507   GFRAA >60 03/09/2016 0507   Lipase     Component Value Date/Time   LIPASE 55 (H)  01/20/2016 0158       Studies/Results: No results found.  Anti-infectives: Anti-infectives    Start     Dose/Rate Route Frequency Ordered Stop   03/08/16 1730  erythromycin ethylsuccinate (EES) 200 MG/5ML suspension 200 mg     200 mg Per Tube Every 8 hours 03/08/16 1634     02/29/16 2000  cefTRIAXone (ROCEPHIN) 2 g in dextrose 5 % 50 mL IVPB     2 g 100 mL/hr over 30 Minutes Intravenous Every 24 hours 02/29/16 1804 03/02/16 2017   02/27/16 2000  cefTRIAXone (ROCEPHIN) 2 g in dextrose 5 % 50 mL IVPB  Status:  Discontinued     2 g 100 mL/hr over 30 Minutes Intravenous Every 24 hours 02/26/16 2137 02/29/16 1804       Assessment/Plan  EC fistula  - SBFT 1/16showsleak from GJ anastomosis approximately 30cm proximal to J tube tip - continues to tolerate continuous TF's at 5410mL/hr - decreased output from eaken's pouch and perc drain. Discussed importance of I&O's with nurse - continue only liquid medications via J tube and no crushed meds - continue G-tube to gravity  FEN - NPO, IVF, TPN, TF at night VTE - Lovenox   Plan - Pt did not tolerate 4415mL/hr tube feeds so  went back to 10mL. Concerns that there is tube feeding in G tube bag. Hopefully going back to 20mL/hr will resolve this. Will continue to monitor. Fistula looks smaller.    LOS: 12 days    Jerre Simon , [facility] 03/09/2016, 3:55 PM Pager: [phone] Consults: [phone] Mon-Fri 7:00 am-4:30 pm Sat-Sun 7:00 am-11:30 am

## 2016-03-09 NOTE — Progress Notes (Signed)
Speech Language Pathology Daily Session Note  Patient Details  Name: [name] MRN: [id number] Date of Birth: 28-Aug-1940  Today's Date: 03/09/2016 SLP Individual Time: 1002-1100 SLP Individual Time Calculation (min): 58 min  Short Term Goals: Week 2: SLP Short Term Goal 1 (Week 2): Pt will utilize speech intelligibility strategies at the sentence level with Min A verbal cues to achieve intelligibility.  SLP Short Term Goal 2 (Week 2): Pt will complete basic, familiar tasks iwth Min A verbal cues for functional problem solving.  SLP Short Term Goal 3 (Week 2): Pt will sustain attention to functional tasks for ~10 minutes with Mod A verbal cues.  SLP Short Term Goal 4 (Week 2): Pt will utilize external memory aids to recall new, daily information with Mod A verbal cues.  SLP Short Term Goal 5 (Week 2): Pt will consistently demonstrate O x 4 with supervision verbal cues.   Skilled Therapeutic Interventions:  Pt was seen for skilled ST targeting cognitive goals.  Pt needed mod encouragement to initiate and sequence steps of transfer to get out of bed rather than asking for assistance from therapist.  SLP facilitated the session with a money management task to address problem solving goals.  Pt was 100% accurate with supervision verbal cues for counting money and making change but needed mod-max assist multimodal cues for organization and working memory when balancing a checkbook.  Pt was returned to room and left in wheelchair with daughter at bedside.  Continue per current plan of care.       Function:  Eating Eating                 Cognition Comprehension Comprehension assist level: Understands basic 90% of the time/cues < 10% of the time  Expression   Expression assist level: Expresses basic 75 - 89% of the time/requires cueing 10 - 24% of the time. Needs helper to occlude trach/needs to repeat words.  Social Interaction Social Interaction assist level: Interacts appropriately  50 - 74% of the time - May be physically or verbally inappropriate.  Problem Solving Problem solving assist level: Solves basic 50 - 74% of the time/requires cueing 25 - 49% of the time  Memory Memory assist level: Recognizes or recalls 50 - 74% of the time/requires cueing 25 - 49% of the time    Pain Pain Assessment Pain Assessment: No/denies pain  Therapy/Group: Individual Therapy  Timberlynn Kizziah, Melanee SpryNicole L 03/09/2016, 12:38 PM

## 2016-03-09 NOTE — Progress Notes (Signed)
Pt was unable to tolerate tube feedings at 5315ml/hr and did infuse at a rate of 2710ml/hr.last night.

## 2016-03-09 NOTE — Progress Notes (Signed)
[facility]     PROGRESS NOTE  Subjective/Complaints: Pt seen laying in bed this AM.  He indicated he slept well and that he has not had any issues with his TFs.  ROS: Denies vomiting, diarrhea, shortness of breath or chest pain  Objective: Vital Signs: Blood pressure (!) 110/45, pulse 90, temperature 97.5 F (36.4 C), temperature source Oral, resp. rate 18, height 6\' 2"  (1.88 m), weight 70.9 kg (156 lb 3.8 oz), SpO2 99 %. No results found.  Recent Labs  03/07/16 0555  WBC 10.9*  HGB 8.4*  HCT 26.1*  PLT 365    Recent Labs  03/07/16 0555 03/09/16 0507  NA 135 134*  K 3.7 3.8  CL 109 107  GLUCOSE 74 162*  BUN 27* 27*  CREATININE 0.60* 0.57*  CALCIUM 8.0* 8.1*   CBG (last 3)   Recent Labs  03/08/16 2050 03/09/16 0006 03/09/16 0405  GLUCAP 157* 148* 157*    Wt Readings from Last 3 Encounters:  03/09/16 70.9 kg (156 lb 3.8 oz)  02/26/16 78.6 kg (173 lb 4.5 oz)  01/19/16 79.2 kg (174 lb 8 oz)    Physical Exam:  BP (!) 110/45 (BP Location: Left Arm)   Pulse 90   Temp 97.5 F (36.4 C) (Oral)   Resp 18   Ht 6\' 2"  (1.88 m)   Wt 70.9 kg (156 lb 3.8 oz)   SpO2 99%   BMI 20.06 kg/m  Constitutional: NAD. Vital signs reviewed. Frail.  HENT: Normocephalic and atraumatic. +Stoma. Eyes: EOMI. No discharge.  Cardiovascular: RRR. No JVD. Respiratory: CTA bilaterally. Unlabored.  GI: He exhibits no distension. BS+. +Eakins pouch  LUQ drain  LLQ drain  Musculoskeletal: He exhibits no edema. He exhibits no tenderness.  Neurological: He is alert.  Sitting balance is good Speech with dysarthria, improving  Motor: 4-/5 throughout (stable) Skin: Skin is warm and dry. Abdominal wounds with dressing.   Assessment/Plan: 1. Functional deficits secondary to debility and encephalopathy which require 3+ hours per day of interdisciplinary therapy in a comprehensive inpatient rehab setting. Physiatrist is providing close team supervision  and 24 hour management of active medical problems listed below. Physiatrist and rehab team continue to assess barriers to discharge/monitor patient progress toward functional and medical goals.  Function:  Bathing Bathing position Bathing activity did not occur: Refused Position: Bed  Bathing parts Body parts bathed by patient: Right arm, Left arm, Chest Body parts bathed by helper: Left lower leg, Right lower leg, Left upper leg, Right upper leg  Bathing assist Assist Level:  (Total A)      Upper Body Dressing/Undressing Upper body dressing Upper body dressing/undressing activity did not occur: Safety/medical concerns What is the patient wearing?: Pull over shirt/dress     Pull over shirt/dress - Perfomed by patient: Thread/unthread right sleeve, Thread/unthread left sleeve Pull over shirt/dress - Perfomed by helper: Put head through opening, Pull shirt over trunk        Upper body assist Assist Level: Touching or steadying assistance(Pt > 75%)      Lower Body Dressing/Undressing Lower body dressing Lower body dressing/undressing activity did not occur: Safety/medical concerns What is the patient wearing?: Pants, Ted Hose, Non-skid slipper socks       Pants- Performed by helper: Thread/unthread right pants leg, Thread/unthread left pants leg, Pull pants up/down   Non-skid slipper socks- Performed by helper: Don/doff right sock, Don/doff left sock   Socks - Performed by helper: Don/doff right sock, Don/doff left  sock           TED Hose - Performed by helper: Don/doff left TED hose, Don/doff right TED hose  Lower body assist Assist for lower body dressing: 2 Helpers      Toileting Toileting Toileting activity did not occur: N/A Toileting steps completed by patient: Performs perineal hygiene Toileting steps completed by helper: Adjust clothing prior to toileting, Performs perineal hygiene, Adjust clothing after toileting Toileting Assistive Devices: Grab bar or rail   Toileting assist Assist level: Touching or steadying assistance (Pt.75%)   Transfers Chair/bed transfer   Chair/bed transfer method: Ambulatory Chair/bed transfer assist level: Moderate assist (Pt 50 - 74%/lift or lower) Chair/bed transfer assistive device: Armrests, Patent attorney     Max distance: 80 ft Assist level: Touching or steadying assistance (Pt > 75%)   Wheelchair   Type: Manual Max wheelchair distance: 40 ft Assist Level: Supervision or verbal cues  Cognition Comprehension Comprehension assist level: Understands basic 90% of the time/cues < 10% of the time  Expression Expression assist level: Expresses basic 50 - 74% of the time/requires cueing 25 - 49% of the time. Needs to repeat parts of sentences.  Social Interaction Social Interaction assist level: Interacts appropriately 50 - 74% of the time - May be physically or verbally inappropriate.  Problem Solving Problem solving assist level: Solves basic 50 - 74% of the time/requires cueing 25 - 49% of the time  Memory Memory assist level: Recognizes or recalls 50 - 74% of the time/requires cueing 25 - 49% of the time    Medical Problem List and Plan: 1. Debility and encephalopathy secondary to small bowel obstruction and associated complications   Cont CIR   Surgery evaluated, TFs per Surgery   No crushed meds through feeding tube 2. DVT Prophylaxis/Anticoagulation: Pharmaceutical: Lovenox  3. Pain Management: Changed PCA to IV morphine every 4 hours prn severe pain   pain in general has been better controlled. Pt aware there will be ebbs and flows 4. Mood: team to provide ego support. LCSW to follow for evaluation and support.   Prozac started 1/18 5. Neuropsych: This patient is not fully capable of making decisions on his own behalf.  6. Skin/Wound Care: Continue air mattress overlay. Continue large Eakin's pouch to manage wound drainage. WOC for management. Encourage side lying to help manage  MASD.   -I see no changes in abdomen or pouch/drains 7. Malnutrition/Fluids/Electrolytes/Nutrition: continue TPN.  8. Abdominal abscess: Leucocytosis resolving. Completed rocephin 1/17 9. Anemia of chronic illness:   Hb 8.4 on 1/22 (stable)  Cont to monitor  Hemoccult + 10. Electrolyte abnormality/Acute renal insufficiency: Managed by TNA   Mag improved to 1/8 on 1/24 11. Encephalopathy: Changed haldol to IM prn for management of agitation/anxiety.  12. Hyperglycemia  Due to TFs  Slightly labile, will cont to monitor 13. Leukocytosis: Stable  WBC 10.9 on 03/07/2016  Afebrile  Ucx NG 14. Orthostasis: Improved 15. Sleep disturbance  Has tried Haldol without benefit, ativan causes hallucinations  Continue Xanax elixer, no meds per tube 16. Mild hyponatremia  Na+ 134 on 1/24  LOS (Days) 12 A FACE TO FACE EVALUATION WAS PERFORMED  Ka Bench Karis Juba 03/09/2016 8:43 AM

## 2016-03-09 NOTE — Progress Notes (Signed)
Occupational Therapy Session Note  Patient Details  Name: [name] MRN: [id number] Date of Birth: May 02, 1940  Today's Date: 03/09/2016 OT Individual Time: [id number] OT Individual Time Calculation (min): 30 min    Skilled Therapeutic Interventions/Progress Updates:    1:1 OT session focused on activity tolerance and general strengthening. Pt brought to sitting EOB with Max A. Increased dizziness with positional changes. LB and UB there-ex completed sitting EOB- knee extension, seated marches, ankle pumps, bicep curl and triceps press with orange thera-band. Verbal and tactile cues to facilitate anterior weight shift in sitting. Pt declined transfer to recliner, but completed lateral scoot along EOB with overall supervision and verbal cues. Pt returned to supine and left in care of RN and family.   See Function Navigator for Current Functional Status.   Therapy/Group: Individual Therapy  [name] 03/09/2016, 3:08 PM

## 2016-03-10 ENCOUNTER — Inpatient Hospital Stay (HOSPITAL_COMMUNITY): Payer: Medicare Other | Admitting: Speech Pathology

## 2016-03-10 ENCOUNTER — Inpatient Hospital Stay (HOSPITAL_COMMUNITY): Payer: Medicare Other | Admitting: Physical Therapy

## 2016-03-10 ENCOUNTER — Encounter (HOSPITAL_COMMUNITY): Payer: Self-pay | Admitting: Surgery

## 2016-03-10 ENCOUNTER — Inpatient Hospital Stay (HOSPITAL_COMMUNITY): Payer: Medicare Other | Admitting: Occupational Therapy

## 2016-03-10 DIAGNOSIS — K9189 Other postprocedural complications and disorders of digestive system: Secondary | ICD-10-CM | POA: Insufficient documentation

## 2016-03-10 DIAGNOSIS — Z934 Other artificial openings of gastrointestinal tract status: Secondary | ICD-10-CM | POA: Insufficient documentation

## 2016-03-10 LAB — COMPREHENSIVE METABOLIC PANEL
ALBUMIN: 1.4 g/dL — AB (ref 3.5–5.0)
ALK PHOS: 167 U/L — AB (ref 38–126)
ALT: 41 U/L (ref 17–63)
ANION GAP: 4 — AB (ref 5–15)
AST: 40 U/L (ref 15–41)
BUN: 27 mg/dL — ABNORMAL HIGH (ref 6–20)
CALCIUM: 7.9 mg/dL — AB (ref 8.9–10.3)
CHLORIDE: 110 mmol/L (ref 101–111)
CO2: 23 mmol/L (ref 22–32)
Creatinine, Ser: 0.54 mg/dL — ABNORMAL LOW (ref 0.61–1.24)
GFR calc Af Amer: >60 mL/min (ref >60)
GFR calc non Af Amer: >60 mL/min (ref >60)
GLUCOSE: 135 mg/dL — AB (ref 65–99)
Potassium: 3.9 mmol/L (ref 3.5–5.1)
SODIUM: 137 mmol/L (ref 135–145)
Total Bilirubin: 0.3 mg/dL (ref 0.3–1.2)
Total Protein: 5.5 g/dL — ABNORMAL LOW (ref 6.5–8.1)

## 2016-03-10 LAB — GLUCOSE, CAPILLARY
GLUCOSE-CAPILLARY: 91 mg/dL (ref 65–99)
GLUCOSE-CAPILLARY: 150 mg/dL — AB (ref 65–99)
GLUCOSE-CAPILLARY: 106 mg/dL — AB (ref 65–99)
GLUCOSE-CAPILLARY: 94 mg/dL (ref 65–99)
GLUCOSE-CAPILLARY: 71 mg/dL (ref 65–99)
Glucose-Capillary: 133 mg/dL — ABNORMAL HIGH (ref 65–99)
Glucose-Capillary: 155 mg/dL — ABNORMAL HIGH (ref 65–99)

## 2016-03-10 LAB — CBC
HEMATOCRIT: 25.9 % — AB (ref 39.0–52.0)
Hemoglobin: 8.2 g/dL — ABNORMAL LOW (ref 13.0–17.0)
MCH: 29 pg (ref 26.0–34.0)
MCHC: 31.7 g/dL (ref 30.0–36.0)
MCV: 91.5 fL (ref 78.0–100.0)
Platelets: 313 10*3/uL (ref 150–400)
RBC: 2.83 MIL/uL — ABNORMAL LOW (ref 4.22–5.81)
RDW: 16 % — AB (ref 11.5–15.5)
WBC: 7.2 10*3/uL (ref 4.0–10.5)

## 2016-03-10 LAB — PHOSPHORUS: Phosphorus: 4.1 mg/dL (ref 2.5–4.6)

## 2016-03-10 LAB — MAGNESIUM: Magnesium: 1.7 mg/dL (ref 1.7–2.4)

## 2016-03-10 MED ORDER — MAGNESIUM SULFATE 50 % IJ SOLN
3.0000 g | Freq: Once | INTRAVENOUS | Status: AC
  Administered 2016-03-10: 3 g via INTRAVENOUS
  Filled 2016-03-10 (×3): qty 6

## 2016-03-10 MED ORDER — OMEPRAZOLE 2 MG/ML ORAL SUSPENSION
40.0000 mg | Freq: Every day | ORAL | Status: DC
  Administered 2016-03-11 – 2016-03-29 (×18): 40 mg
  Filled 2016-03-10 (×23): qty 20

## 2016-03-10 MED ORDER — FAT EMULSION 20 % IV EMUL
240.0000 mL | INTRAVENOUS | Status: AC
  Administered 2016-03-10: 240 mL via INTRAVENOUS
  Filled 2016-03-10: qty 250

## 2016-03-10 MED ORDER — TRACE MINERALS CR-CU-MN-SE-ZN 10-1000-500-60 MCG/ML IV SOLN
INTRAVENOUS | Status: AC
  Administered 2016-03-10: 18:00:00 via INTRAVENOUS
  Filled 2016-03-10: qty 2000

## 2016-03-10 NOTE — Progress Notes (Signed)
Physical Therapy Session Note  Patient Details  Name: [name] MRN: [id number] Date of Birth: 1941/01/09  Today's Date: 03/10/2016 PT Individual Time: 1100-1158 PT Individual Time Calculation (min): 58 min   Short Term Goals: Week 2:  PT Short Term Goal 1 (Week 2): Patient will perform bed mobility with supervision consistently PT Short Term Goal 2 (Week 2): Patient will perform sit-to-stand and stand pivot transfers using RW with min A and minimal cues   PT Short Term Goal 3 (Week 2): Patient will ambulate 75 ft with RW and min A PT Short Term Goal 4 (Week 2): Patient will maintain standing balance with min A for 3 minutes PT Short Term Goal 5 (Week 2): Patient will have initiated stair training   Skilled Therapeutic Interventions/Progress Updates:    Session focused on functional transfers with RW, overall endurance and activity tolerance, and standing balance and endurance while performing functional reaching task. Pt requires min to mod assist for transfers (mod assist out of recliner due to low surface and mod assist at end of session due to fatigue) with cues for hand placement and facilitating weightshift. Pt requires min assist for balance in standing. Requires rest breaks in semi-reclined position between standing trials. Vitals WFL despite reports of dizziness throughout. End of session returned to bed to rest before afternoon session.   Therapy Documentation Precautions:  Precautions Precautions: Fall Precaution Comments: NPO, gastrostomy tube, LLQ JP drain, abdominal surgical incision with pouch Restrictions Weight Bearing Restrictions: No Vital Signs: Therapy Vitals Pulse Rate: 78 BP: 126/71 Patient Position (if appropriate): Sitting Pain:  Denies pain.   See Function Navigator for Current Functional Status.   Therapy/Group: Individual Therapy  [name], [name]  Tyquez Hollibaugh B. Francella Barnett, PT, DPT  03/10/2016, 12:03 PM

## 2016-03-10 NOTE — Progress Notes (Addendum)
Occupational Therapy Session Note  Patient Details  Name: [name] MRN: [id number] Date of Birth: Jul 02, 1940  Today's Date: 03/10/2016   Session 1 OT Individual Time: 1000-1100 OT Individual Time Calculation (min): 60 min   Session 2 OT Individual Time: 1430-1500 OT Individual Time Calculation (min): 30 min    Short Term Goals: Week 2:  OT Short Term Goal 1 (Week 2): Pt will complete bathing with mod assist at sit > stand level OT Short Term Goal 2 (Week 2): Pt will tolerate 5 minutes of standing activity in preparation for ADL tasks OT Short Term Goal 3 (Week 2): Pt will complete LB dressing with Mod A  Skilled Therapeutic Interventions/Progress Updates:    1:1 OT session focused on modified bathing/dressing, improved sit<>stand, activity tolerance, and standing endurance. Pt greeted in bed, incontinent of bowel, requiring total A for peri-care and brief change. Stand-pivot transfer bed>w/c w/ RW and Min A and Vc. Modified bathing/dressing completed at the sink with focus on LB with overall Mod/Max A. Pt requires encouragement and verbal cues to attempt ADLs that he thinks he "can't do". Sit<>stands at the sink with overall Min A and Min A to maintain balance. Pt unable to remove unilateral UE from sink to assist with pulling up pants 2/2 fatigue. Pt tolerated 3 minutes at longest standing bout. Stand-pivot to recliner in similar fashion as above and handed off to PT for next therapy session.   Session 2 OT session focsued on activity tolerance and fine motor coordination. Pt completed stand-pivot from bed>w.c with Mod A stand. Min A pivot.Graded handwriting prep activity with horizontal and vertical lines. Pt then progressed to writing name. OT provided R elbow support for improved distal control of pencil. Pt returned to room and transferred back to bed in similar fashion as above. Pt left with needs met and family present.   Therapy Documentation Precautions:   Precautions Precautions: Fall Precaution Comments: NPO, gastrostomy tube, LLQ JP drain, abdominal surgical incision with pouch Restrictions Weight Bearing Restrictions: No Pain: Pain Assessment Pain Assessment: No/denies pain  See Function Navigator for Current Functional Status.   Therapy/Group: Individual Therapy  Valma Cava 03/10/2016, 4:17 PM

## 2016-03-10 NOTE — Patient Care Conference (Signed)
Inpatient RehabilitationTeam Conference and Plan of Care Update Date: 03/09/2016   Time: 2:00 PM    Patient Name: [name]      Medical Record Number: [id number]  Date of Birth: 15-Apr-1940 Sex: Male         Room/Bed: 4W06C/4W06C-01 Payor Info: Payor: Multimedia programmer / Plan: [employer] MEDICARE / Product Type: *No Product type* /    Admitting Diagnosis: Debility  Admit Date/Time:  02/26/2016  8:56 PM Admission Comments: No comment available   Primary Diagnosis:  Debility Principal Problem: Debility  Patient Active Problem List   Diagnosis Date Noted  . Hyponatremia   . Malnutrition (HCC)   . Anxiety and depression   . Sleep disturbance   . Orthostasis   . Leukocytosis   . Hyperglycemia   . FTT (failure to thrive) in adult   . Post-operative pain   . Tracheostomy status (HCC)   . Chronic respiratory failure with hypoxia (HCC)   . Enterocutaneous fistula 02/13/2016  . Anemia of chronic disease 01/31/2016  . Stage 2 skin ulcer of sacral region 01/31/2016  . Hypomagnesemia 01/21/2016  . Hypokalemia 01/21/2016  . SBO (small bowel obstruction) 01/20/2016  . Pressure injury of skin 01/20/2016  . Tracheostomy care (HCC)   . Pleural effusion   . Gastroesophageal reflux disease 01/19/2016  . Insomnia 01/19/2016  . Aspiration pneumonia (HCC) 01/17/2016  . On total parenteral nutrition (TPN) 01/17/2016  . Metabolic acidosis 01/17/2016  . Skin ulcer of abdominal wall, limited to breakdown of skin (HCC) 01/17/2016  . Dysphagia 01/17/2016  . Debility 01/17/2016  . 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 Vitamin B12 deficiency 01/02/2016  . BPH (benign prostatic hyperplasia)   . Posterior vitreous detachment   . Salzmann's nodular dystrophy of left eye   . Hyperopia of both eyes with astigmatism and presbyopia   . Central pterygium of right eye   . Pressure ulcer of buttock, right, unstageable (HCC) 12/30/2015  . Hypernatremia 12/28/2015  . Tracheostomy in place  [facility]) 12/25/2015  . Tobacco abuse 12/09/2015  . Ischemic right index finger at tip 12/09/2015  . Gastritis 12/09/2015  . Gastric tube present (LUQ) 12/09/2015  . Acute respiratory failure with hypoxemia (HCC)   . Protein-calorie malnutrition, severe 12/04/2015  . Encephalopathy acute 12/03/2015  . Pyloric stricture 11/24/2015  . Anxiety state 09/28/2015  . Hyperlipidemia   . 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 Coronary atherosclerosis of native coronary artery 04/24/2013  . Essential hypertension, benign 04/24/2013  . Other and unspecified hyperlipidemia 04/24/2013  . Central pterygium 10/30/2012  . 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 Cataract, nuclear sclerotic senile 10/30/2012    Expected Discharge Date: Expected Discharge Date: 03/17/16  Team Members Present: Physician leading conference: Dr. Maryla Morrow Social Worker Present: Dossie Der, LCSW Nurse Present: Carmie End, RN PT Present: Bayard Hugger, PT OT Present: Kearney Hard, OT SLP Present: Jackalyn Lombard, SLP PPS Coordinator present : Tora Duck, RN, CRRN     Current Status/Progress Goal Weekly Team Focus  Medical   Debility and encephalopathy secondary to small bowel obstruction and associated complications   Improve pain, sleep, toleration of tube feeds, pain  See above   Bowel/Bladder   incontinent of b/b, LBM 1/23, condom cath, bladder scans= 0 residual,   assess b/b q shift and prn  encourage toileting and prn meds for constipation as needed   Swallow/Nutrition/ Hydration   Remains NPO with JG tube,  strict bowel rest          ADL's   Mod A overall  supervision-Min A overall  standing tolernace, activity tolerance, functional transfers, modified b/d, pt/family ed   Mobility   Supervison-Max A overall and capable of ambulating short distances with RW. pt continues to be limited due to dizziness and deconditioning.    Supervision-Min A overall with bed mobility being Mod I  upright and activity tolerance, ambulation, transfers/bed mobility, general strengthening   Communication   mod assist   supervision, upgraded   continue to address increased vocal intensity, overarticulation and slow rate for intelligibility    Safety/Cognition/ Behavioral Observations  mod assist   supervision, upgraded   recall of daily information, attention to tasks, awareness of deficits, problem solving    Pain   current shift pt denies pain, last shift tylenol given, voltaren scheduled. occasional morphine, last dose was 1/21.  maintain pain level as tolerated  assess pain q shift and prn.   Skin   pt cannot tolerate side lying, turns to supine position.  sacral foam prophylaxis, no breakdown, change q 3 days.  Abdominal surgical incision with Eakins pouch for drain, [facility] G=J [facility]  site reddened and crusty, zinc oxide ointment. JP drain site pink.   maintain skin integrity due to pressure and tubes/drains/incision, no s/s of infection, no further skin breakdown.  assess skin q shift and prn, dressing changes as ordered.      *See Care Plan and progress notes for long and short-term goals.  Barriers to Discharge: Pain, sleep, SBO, anemia, pouch, cognition,     Possible Resolutions to Barriers:  Pain control, tolerating tube, follow labs    Discharge Planning/Teaching Needs:  Family concerned abut his progress and wanting to get him home. Do not want to have to go to NH, do not feel he is medically stable for transferring to next venue      Team Discussion:  Making progress toward his goals-still complains of dizziness laying down and while sitting. MD is aware of this. RN working on bowel and bladder continence. Tolerating tube feedings not increased. Ques CT ordered-per daughter. Complex medical issues question if family can take home and provide all of this. Daughter and wife here daily.  Neuro-psych to see tomorrow.   Revisions to Treatment Plan:  Question discharge plan at this time.   Continued Need for Acute Rehabilitation Level of Care: The patient requires daily medical management by a physician with specialized training in physical medicine and rehabilitation for the following conditions: Daily direction of a multidisciplinary physical rehabilitation program to ensure safe treatment while eliciting the highest outcome that is of practical value to the patient.: Yes Daily medical management of patient stability for increased activity during participation in an intensive rehabilitation regime.: Yes Daily analysis of laboratory values and/or radiology reports with any subsequent need for medication adjustment of medical intervention for : Nutritional problems;Post surgical problems;Wound care problems;Mood/behavior problems  Lucy ChrisDupree, Iasha Mccalister G 03/10/2016, 9:04 AM

## 2016-03-10 NOTE — Progress Notes (Signed)
Social Work Patient ID: [name], male   DOB: Aug 22, 1940, 76 y.o.   MRN: [id number]  Met with daughter to inform team conference progress toward his goals and discharge date not changed. Family was hoping Team could extend the discharge date since he is improving and has made progress-MD feels if he becomes NHP it will take time to find a bed with pt's complex medical issues. Daughter reports Surgical PA has come in And ordered a CT scan to check healing and make sure tube feeding. Daughter upset more medically could not be accomplished while here so not so difficult to manage if goes home from rehab. 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 Both wife and daughter here daily and attend therapies with pt.

## 2016-03-10 NOTE — Progress Notes (Signed)
PHARMACY - TOTAL PARENTERAL NUTRITION CONSULT NOTE   Pharmacy Consult:  TPN Indication:  SBO and new EC fistula 02/13/16  Patient Measurements: Height: _0  (188 cm) Weight: 157 lb 11.2 oz (71.5 kg) IBW/kg (Calculated) : 82.2   Body mass index is 20.25 kg/m.  Assessment:  58 YOM with history of multiple procedures and extended hospitalization for recurrent partial GOO 2/2 pyloric stricture.  Patient was on TPN for several days, then discharged on 12/29/16 with J/G tube in place for TF. He was unable to tolerate TF and was transitioned back to TPN by [facility] on 01/05/17.  Patient was readmitted on 01/19/17 with partial SBO.  Patient failed MBS on 01/25/17 and did not tolerate bolus feeds through G-tube on 01/26/17. On 12/23, he underwent ex-lap with LoA, segmental SBR, and gastrojejunostomy. On 02/11/14, TF were found draining from midline incision with CT abd showing bowel perforation at [place] sit and development of abscesses.   GI: Prealbumin WNL at 24.  1/16 imaging showed EC fistula.  Drain O/P 537m.  Bisacodyl PR, BM x2.  Unable to advance TF to 15 ml/hr, erythromycin started 1/23.  Fistula is smaller per documentation, concerned that TF is in Gtube bag. Endo: no hx DM - CBGs adequately controlled (CBGs 75-102 off TPN, 135-176 on TPN) Insulin requirements in the past 24 hours:  SSI d/c'ed 1/14 Lytes: all WNL (Mag decreased despite supplementation) Renal: SCr 0.57, CrCL 87 ml/min - good UOP 0.6 ml/kg/hr Pulm: stable on RA - nicotine patch Cards: orthostatic hypotension improved  Hepatobil: LFTs WNL except alk phos.  TG WNL. Neuro: Prozac, Xanax, lidocaine patch, PRN morphine ID: s/p CTX 1/13 > 1/17 for UTI - afebrile, WBC 10.9 Best Practices: Lovenox, MC TPN Access: PICC TPN start date: [facility] > [facility] 01/20/16 > [facility] 1/12  Home TPN: 2156 kCal and 75gm protein per day (405g CHO = 1376 kCal, 50g lipid = 480 kCal)  Nutritional Goals: 2100 - 2300 kCal and 95 - 110 g protein per day  Current  Nutrition:  Jevity 1.2 at 10 ml/hr for 16 hrs (192 kCal, 9 gm protein) Clinimix + lipid twice weekly (daily average of 2177 kCal, 85g protein)   Plan:  - Jevity 1.2 at 10 ml/hr over 16 hours (1600 to 0800) - Switch to Clinimix E 5/15, infuse 2000 mls over 12 hrs:  50 ml/hr x 1 hr, then 190 ml/hr x 10 hrs, then 50 mL/hr x 1 hr.  Continue lipid 20 ml/hr x 12 hrs. - Clinimix and TF at 10 ml/hr will provide 2092 kCal and 109gm protein per day, meeting ~100% of patient's needs. - Multivitamin PT daily.  Trace elements every other day because med is on critical shortage (give today).  - Mag 3gm IV x 1.  Hold off on rechecking level. - F/U with TF tolerance to advance   [name] D. DMina Marble PharmD, BCPS Pager:  [phone]/25/2018, 8:22 AM

## 2016-03-10 NOTE — Consult Note (Addendum)
PSYCHODIAGNOSTIC EVALUATION - [facility] [name]   [name] is a 76 year old man, who was seen for an initial psychodiagnostic evaluation in the setting of debility to assess for potential depression, anxiety, or other mental illness.    During the session, Mr. [name] reported that he is generally doing "okay," though he lamented the slow speed of physical recovery.  He denied symptoms of major depression or anxiety, including suicidal ideation and described his overall mood as "pretty good."  He said that he has been able to appreciate "a little" of the physical progress that he has made thus far.  He denied having significant worries or concerns at this time.  He attributed his ability to keep a positive attitude to thinking about his wife and daughter who serve as a strong support system for him.  He denied issues sleeping, but noted significant daytime fatigue.  He denied experience of pain and noted that he remains highly motivated to participate in the various therapies on this unit.    IMPRESSION:  Mr. [name] seems to be coping well emotionally with ramifications of his stroke.  There was no evidence of a more pervasive mood disorder.  He was provided with psychoeducation regarding risk for development of depression and was encouraged to inform his care team should he develop any symptoms.  Continued follow-up with neuropsychology could be requested should the treatment team feel that it would be beneficial in informing care.    DIAGNOSIS:   Debility  Leavy CellaKaren Teva Bronkema, Psy.D.  Clinical Neuropsychologist

## 2016-03-10 NOTE — Progress Notes (Signed)
[place] PHYSICAL MEDICINE & REHABILITATION     PROGRESS NOTE  Subjective/Complaints: Pt seen laying in bed this AM. He states he slept well overnight.  He needs "nothing".  ROS: Denies vomiting, diarrhea, shortness of breath or chest pain  Objective: Vital Signs: Blood pressure 126/71, pulse 78, temperature 97.8 F (36.6 C), temperature source Oral, resp. rate 18, height 6\' 2"  (1.88 m), weight 71.5 kg (157 lb 11.2 oz), SpO2 98 %. No results found.  Recent Labs  03/10/16 0506  WBC 7.2  HGB 8.2*  HCT 25.9*  PLT 313    Recent Labs  03/09/16 0507 03/10/16 0506  NA 134* 137  K 3.8 3.9  CL 107 110  GLUCOSE 162* 135*  BUN 27* 27*  CREATININE 0.57* 0.54*  CALCIUM 8.1* 7.9*   CBG (last 3)   Recent Labs  03/10/16 0442 03/10/16 0852 03/10/16 1210  GLUCAP 150* 106* 94    Wt Readings from Last 3 Encounters:  03/10/16 71.5 kg (157 lb 11.2 oz)  02/26/16 78.6 kg (173 lb 4.5 oz)  01/19/16 79.2 kg (174 lb 8 oz)    Physical Exam:  BP 126/71 (BP Location: Left Arm)   Pulse 78   Temp 97.8 F (36.6 C) (Oral)   Resp 18   Ht 6\' 2"  (1.88 m)   Wt 71.5 kg (157 lb 11.2 oz)   SpO2 98%   BMI 20.25 kg/m  Constitutional: NAD. Vital signs reviewed. Frail.  HENT: Normocephalic and atraumatic. +Stoma. Eyes: EOMI. No discharge.  Cardiovascular: RRR. No JVD. Respiratory: CTA bilaterally. Unlabored.  GI: He exhibits no distension. BS+. +Eakins pouch  LUQ drain  LLQ drain  Musculoskeletal: He exhibits no edema. He exhibits no tenderness.  Neurological: He is alert.  Sitting balance is good Speech with dysarthria, improving  Motor: 4-/5 throughout (unchanged) Skin: Skin is warm and dry. Abdominal wounds with dressing.   Assessment/Plan: 1. Functional deficits secondary to debility and encephalopathy which require 3+ hours per day of interdisciplinary therapy in a comprehensive inpatient rehab setting. Physiatrist is providing close team supervision and 24 hour management of  active medical problems listed below. Physiatrist and rehab team continue to assess barriers to discharge/monitor patient progress toward functional and medical goals.  Function:  Bathing Bathing position Bathing activity did not occur: Refused Position: Wheelchair/chair at sink  Bathing parts Body parts bathed by patient: Right arm, Left arm, Chest, Front perineal area, Right upper leg, Left upper leg Body parts bathed by helper: Right lower leg, Buttocks, Back, Left lower leg  Bathing assist Assist Level: Touching or steadying assistance(Pt > 75%)      Upper Body Dressing/Undressing Upper body dressing Upper body dressing/undressing activity did not occur: Safety/medical concerns What is the patient wearing?: Pull over shirt/dress     Pull over shirt/dress - Perfomed by patient: Thread/unthread right sleeve, Thread/unthread left sleeve, Put head through opening Pull over shirt/dress - Perfomed by helper: Pull shirt over trunk        Upper body assist Assist Level: Touching or steadying assistance(Pt > 75%)      Lower Body Dressing/Undressing Lower body dressing Lower body dressing/undressing activity did not occur: Safety/medical concerns What is the patient wearing?: Pants, Non-skid slipper socks, Ted Hose     Pants- Performed by patient: Thread/unthread right pants leg Pants- Performed by helper: Thread/unthread left pants leg, Pull pants up/down   Non-skid slipper socks- Performed by helper: Don/doff left sock, Don/doff right sock   Socks - Performed by helper: Don/doff right  sock, Don/doff left sock           TED Hose - Performed by helper: Don/doff right TED hose, Don/doff left TED hose  Lower body assist Assist for lower body dressing:  (Max A)      Toileting Toileting Toileting activity did not occur: N/A Toileting steps completed by patient: Performs perineal hygiene Toileting steps completed by helper: Adjust clothing prior to toileting, Performs perineal  hygiene, Adjust clothing after toileting Toileting Assistive Devices: Grab bar or rail  Toileting assist Assist level:  (total assist)   Transfers Chair/bed transfer   Chair/bed transfer method: Stand pivot Chair/bed transfer assist level: Moderate assist (Pt 50 - 74%/lift or lower) Chair/bed transfer assistive device: Armrests, Patent attorney     Max distance: 90 ft Assist level: Touching or steadying assistance (Pt > 75%)   Wheelchair   Type: Manual Max wheelchair distance: 80 ft Assist Level: Dependent (Pt equals 0%) (time management and conserve energy)  Cognition Comprehension Comprehension assist level: Understands basic 90% of the time/cues < 10% of the time  Expression Expression assist level: Expresses basic 50 - 74% of the time/requires cueing 25 - 49% of the time. Needs to repeat parts of sentences.  Social Interaction Social Interaction assist level: Interacts appropriately 90% of the time - Needs monitoring or encouragement for participation or interaction.  Problem Solving Problem solving assist level: Solves basic 50 - 74% of the time/requires cueing 25 - 49% of the time  Memory Memory assist level: Recognizes or recalls 50 - 74% of the time/requires cueing 25 - 49% of the time    Medical Problem List and Plan: 1. Debility and encephalopathy secondary to small bowel obstruction and associated complications   Cont CIR   Surgery evaluated, TFs per Surgery, rate decreased to 10ml as pt unable to tolerate 15mg    No crushed meds through feeding tube 2. DVT Prophylaxis/Anticoagulation: Pharmaceutical: Lovenox  3. Pain Management: Changed PCA to IV morphine every 4 hours prn severe pain   pain in general has been better controlled. Pt aware there will be ebbs and flows 4. Mood: team to provide ego support. LCSW to follow for evaluation and support.   Prozac started 1/18 5. Neuropsych: This patient is not fully capable of making decisions on his own  behalf.  6. Skin/Wound Care: Continue air mattress overlay. Continue large Eakin's pouch to manage wound drainage. WOC for management. Encourage side lying to help manage MASD.  7. Malnutrition/Fluids/Electrolytes/Nutrition: continue TPN.   Albumin continues to trend down 8. Abdominal abscess: Leucocytosis resolving. Completed rocephin 1/17 9. Anemia of chronic illness:   Hb 8.2 on 1/25 (stable)  Cont to monitor 10. Electrolyte abnormality/Acute renal insufficiency: Managed by TNA   Mag improved to 1/25 11. Encephalopathy: Changed haldol to IM prn for management of agitation/anxiety.  12. Hyperglycemia  Due to TFs  Slightly labile, overall controlled 13. Leukocytosis: Stable  WBC 10.9 on 03/07/2016  Afebrile  Ucx NG 14. Orthostasis: Improved 15. Sleep disturbance  Has tried Haldol without benefit, ativan causes hallucinations  Continue Xanax elixer, no meds per tube 16. Mild hyponatremia: Improving  Na+ 137 on 1/25  LOS (Days) 13 A FACE TO FACE EVALUATION WAS PERFORMED  Hiroko Tregre Karis Juba 03/10/2016 1:01 PM

## 2016-03-10 NOTE — Progress Notes (Signed)
Nutrition Follow-up  DOCUMENTATION CODES:   Non-severe (moderate) malnutrition in context of chronic illness  INTERVENTION:  Continue TPN per Pharmacy.  Continue Jevity 1.2 formula via J-tube at trickle feeds at 10 ml/hr x 16 hours which provides 192 kcal, 9 grams of protein, and 130 ml of free water.   RD to continue to monitor.   NUTRITION DIAGNOSIS:   Inadequate oral intake related to altered GI function (Enteric fistula) as evidenced by NPO status (TPN dependant); ongoing  GOAL:   Patient will meet greater than or equal to 90% of their needs; met  MONITOR:   Labs, Weight trends, Skin, I & O's (TPN tolerance)  REASON FOR ASSESSMENT:   Malnutrition Screening Tool    ASSESSMENT:   76 y/o mle w/ complex PMHx including COPD, GERD, CAD, PUD. Underwent gastrectomy in August due to stricture w/ post op complications requiring jejunostomy. He was most recently admitted for SBO and enteric fistula. He is on bowel rest, TPN dependent.    Pt unable to tolerate tube feeds at 15 ml/hr, thus, TF decreased back down to 10 ml/hr trickle rate.   Per Pharmacy note, plans to Switch to Clinimix E 5/15, infuse 2000 mls over 12 hrs:  50 ml/hr x 1 hr, then 190 ml/hr x 10 hrs, then 50 mL/hr x 1 hr.  Continue lipid 20 ml/hr x 12 hrs. Clinimix and TF at 10 ml/hr will provide 2092 kcal (100% of needs) and 109 grams of protein.   Once pt able to advance past trickle feeds as tolerated, recommend switching to Jevity 1.5 formula and advance by 10 ml every 4-6 hours or as tolerated to goal rate of 65 ml/hr x 20 hours (may be off TF for up to 4 hours for therapy) with 30 ml Prostat BID to provide 2150 kcal, 113 grams of protein, 988 ml of free water.   RD to continue to monitor.   Labs and medications reviewed.   Diet Order:  Diet NPO time specified Except for: Ice Chips TPN (CLINIMIX-E) Adult  Skin:  Wound (see comment) (Midline wound with EC fistula)  Last BM:  1/24  Height:   Ht Readings  from Last 1 Encounters:  02/27/16 6' 2"  (1.88 m)    Weight:   Wt Readings from Last 1 Encounters:  03/10/16 157 lb 11.2 oz (71.5 kg)    Ideal Body Weight:  86.36 kg  BMI:  Body mass index is 20.25 kg/m.  Estimated Nutritional Needs:   Kcal:  2100-2300 (29-32 kcal/kg bw)  Protein:  95-110 g (1.3-1.5 g/kg bw)  Fluid:  >1.8 L (25 ml/kg bw)  EDUCATION NEEDS:   No education needs identified at this time  Corrin Parker, MS, RD, LDN Pager # [phone] After hours/ weekend pager # [phone]

## 2016-03-10 NOTE — Progress Notes (Signed)
Physical Therapy Session Note  Patient Details  Name: [name] MRN: [id number] Date of Birth: 04/19/1940  Today's Date: 03/10/2016 PT Individual Time: 1312-1400 PT Individual Time Calculation (min): 48 min   Short Term Goals: Week 2:  PT Short Term Goal 1 (Week 2): Patient will perform bed mobility with supervision consistently PT Short Term Goal 2 (Week 2): Patient will perform sit-to-stand and stand pivot transfers using RW with min A and minimal cues   PT Short Term Goal 3 (Week 2): Patient will ambulate 75 ft with RW and min A PT Short Term Goal 4 (Week 2): Patient will maintain standing balance with min A for 3 minutes PT Short Term Goal 5 (Week 2): Patient will have initiated stair training   Skilled Therapeutic Interventions/Progress Updates:    Pt was found in bed and was complaining of no pain. Pt's G tube bag was emptied by nursing before leaving room.   Pt required supervision, HOB elevated, use of bedrails, and max cues for sequence and handplacement to transfer from supine to sitting at EOB.   Pt reported dizziness of 3/10 after sitting at EOB that remained consistent throughout therapy.  All sit to stand transfers during treatment required min-mod A with lifting and lowering and frequent cueing for handplacement. Frequency of cues increased at pt fatigued with treatment.   Pt performed two trials of standing tolerance with Dynavision. Pt stood with Single UE support with RW for 1 min for the first trial with a score of 32 and 40 seconds for the second trial with a score of 17. Pt stopped for back pain, fatigue, and dizziness for each trial.      Pt ambulated 80 ft and 130 ft with RW and CGA. Pt required max cues for proper use of DME and posture. One episode of LOB occurred that required min A to correct.   Pt was left in room in W/C with wife and all needs within reach.   Therapy Documentation Precautions:  Precautions Precautions: Fall Precaution Comments:  NPO, gastrostomy tube, LLQ JP drain, abdominal surgical incision with pouch Restrictions Weight Bearing Restrictions: No   See Function Navigator for Current Functional Status.   Therapy/Group: Individual Therapy  Rudie MeyerJeffrey Emmalin Jaquess 03/10/2016, 2:09 PM

## 2016-03-11 ENCOUNTER — Inpatient Hospital Stay (HOSPITAL_COMMUNITY): Payer: Medicare Other | Admitting: Occupational Therapy

## 2016-03-11 ENCOUNTER — Inpatient Hospital Stay (HOSPITAL_COMMUNITY): Payer: Medicare Other | Admitting: Physical Therapy

## 2016-03-11 ENCOUNTER — Inpatient Hospital Stay (HOSPITAL_COMMUNITY): Payer: Medicare Other | Admitting: Speech Pathology

## 2016-03-11 DIAGNOSIS — K311 Adult hypertrophic pyloric stenosis: Secondary | ICD-10-CM

## 2016-03-11 DIAGNOSIS — K9189 Other postprocedural complications and disorders of digestive system: Secondary | ICD-10-CM

## 2016-03-11 DIAGNOSIS — Z934 Other artificial openings of gastrointestinal tract status: Secondary | ICD-10-CM

## 2016-03-11 LAB — GLUCOSE, CAPILLARY
GLUCOSE-CAPILLARY: 129 mg/dL — AB (ref 65–99)
GLUCOSE-CAPILLARY: 97 mg/dL (ref 65–99)
GLUCOSE-CAPILLARY: 80 mg/dL (ref 65–99)
Glucose-Capillary: 107 mg/dL — ABNORMAL HIGH (ref 65–99)
Glucose-Capillary: 124 mg/dL — ABNORMAL HIGH (ref 65–99)
Glucose-Capillary: 130 mg/dL — ABNORMAL HIGH (ref 65–99)

## 2016-03-11 MED ORDER — FAT EMULSION 20 % IV EMUL
240.0000 mL | INTRAVENOUS | Status: AC
  Administered 2016-03-11: 240 mL via INTRAVENOUS
  Filled 2016-03-11: qty 250

## 2016-03-11 MED ORDER — CLINIMIX E/DEXTROSE (5/15) 5 % IV SOLN
INTRAVENOUS | Status: AC
  Administered 2016-03-11: 18:00:00 via INTRAVENOUS
  Filled 2016-03-11: qty 2000

## 2016-03-11 NOTE — Progress Notes (Addendum)
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   Jejunal anastomotic leak   Anxiety state   Encephalopathy acute   Tobacco abuse   Gastritis   On total parenteral nutrition (TPN)   Skin ulcer of abdominal wall, limited to breakdown of skin (HCC)   Anemia of chronic disease   Hyperglycemia   FTT (failure to thrive) in adult   Post-operative pain   Leukocytosis   Sleep disturbance   Orthostasis   Anxiety and depression   Hyponatremia   Gastrojejunostomy tube 02/06/2016      SURGERY   02/06/2016  POST-OPERATIVE DIAGNOSIS:  SMALL BOWEL OBSTRUCTION  PROCEDURE:   EXPLORATORY LAPAROTOMY & LYSIS OF ADHESIONS SMALL BOWEL RESECTION GASTROJEJUNOSTOMY Startex tube placement  SURGEON:   Autumn Messing III, MD - Primary.  Alphonsa Overall, MD - Assisting  SURGERY 12/03/2015  POST-OPERATIVE DIAGNOSIS:   Peritonitis Jejunal disruption Gastric leak  PROCEDURE:   LAPAROSCOPY DIAGNOSTIC OMENTOPEXY of jejunal disruption [place] OUT x 21L with drains placement  SURGEON: Michael Boston, MD   Post-Op 12/01/2015  POST-OPERATIVE DIAGNOSIS:   RECURRENT GASTRIC OUTLET OBSTRUCTION DUE to significant edema at gastroduodenal anastomosis. GASTRITIS Failure to thrive with malnutrition  PROCEDURE:   LAPAROSCOPIC PLACEMENT OF FEEDING JEJUNOSTOMY AND GASTROSTOMY TUBEs ESOPHAGOGASTRODUODENOSCOPY (EGD) BALLOON DILATION OF DUODENAL STRICTURE Gastric biopsies  Closure of gastrotomy  SURGEON: Michael Boston, MD  Prior surgery 09/25/2015  POST-OPERATIVE DIAGNOSIS: Partial gastric outlet obstruction due to  chronic pyloric stricture  PROCEDURE:  XI ROBOTIC distal gastrectomy BILROTH I ANASTOMOSIS  ANTERIOR AND POSTERIOR VAGOTOMY DOR (Anterior 038 degree) FUNDIPLICATION OMENTOPEXY UPPER ENDOSCOPY  SURGEON: Michael Boston, MD    Assessment  Recovering  Plan:  -EC fistula from SB jejunal anastomosis - closing w smaller pouch & low output a hopeful sign  -I removed the perc drain - scant output & SBFT shows no leak to it per se.  Dr. Kathlene Cote with interventional radiology aware  -trophic TFs in jejunal end of [place] tube.  Tip 30cm distal to jejunal leak but some reflux to it.  Inc rate again to 69m this weekend, then maybe 30 mL/hour next week.  -TNA for severe malnutrition.  Albumin remains <2, but prealbumin in the 20s now which is a hopeful sign.  I suspect his recovery will be slow until Alb>2.5.  -Retry oral intake.  Repeat modified barium swallow now that his nutrition is better, the tracheostomy is out and closed, and he is breathing/speaking more normally with minimal secretions.  He was able to eat before, he does not have evidence of a stroke, he is mentally improved; therefore, he should be able to eat again.  Reasonable to reevaluate since it has been several weeks since the last time.  If he does not fly, so be it.  Check again in a month.  If he flies with his modified barium swallow, allow him to have ice chips and sips of clears, 12083mvolume restricted.  It will just strain out his G-tube portion of the gastrojejunostomy.  If suddenly his enterocutaneous fistula output increases, then go back to nothing by mouth.  If it does not change the output, can advance to a dysphagia one diet.  -  VTE prophylaxis- SCDs, etc  -PPI - restarted omeprazole  -mobilize as tolerated to help recovery.  Making progress w PT/OT/SLP in <2 weeks. I strongly believe rehab CRITICAL for recovery and would maximize inpatient LOS as much as possible  My overall plan would be:  Continue to  control drainage of the low output jejunal fistula.  As that stops, advance tube feeds to goal.  Then wean TNA.  Then gastric tube clamping trials.  Then switch to medicines by mouth or through G-tube instead of liquids only in the jejunostomy tube or per IV  Advance oral diet.  Pull out his gastrostomy tube when he is tolerating everything by mouth more than three weeks.  I suspect this will take several months.  His severe malnutrition will need to be corrected.  That is why IV TNA nutrition and aggressive physical therapy will be absolutely essential.   -Disposition.  I suspect that he is getting be too much to handle to go directly home from rehabilitation.  Probably needs some type of skilled nursing facility as long as they can handle the IV nutrition, gastrojejunostomy tube, and can provide consistent physical therapy to for him to recover.  Hopefully can get to the point where he will not need all these components and will be simpler to manage at home and transition to outpatient rehabilitation.  Again I think these step will take several months; but, He has made definite progress in the past two weeks, so I am hopeful.  I updated the patient's status to the patient and family.  Long discussion over an hour.  Also the rehabilitation nursing team.  Recommendations were made.  Questions were answered.  They expressed understanding & appreciation.   [name], M.D., F.A.C.S. Gastrointestinal and Minimally Invasive Surgery [facility] [address] [place], [place] [address] [phone] Main / Paging   03/11/2016  CARE TEAM:  PCP: Irven Shelling, MD  Outpatient Care Team: Patient Care Team: Lavone Orn, MD as PCP - General (Internal Medicine) Michael Boston, MD as Consulting Physician (General Surgery) Arta Silence, MD as Consulting Physician (Gastroenterology) Chesley Mires, MD as Consulting Physician (Pulmonary  Disease)  Inpatient Treatment Team: Treatment Team: Attending Provider: Jamse Arn, MD; Social Worker: Elease Hashimoto, LCSW; Consulting Physician: Nolon Nations, MD; Physical Therapist: Murray Hodgkins Ward, PT; Registered Nurse: Lars Masson, RN; Registered Nurse: Jama Flavors, RN; Technician: Roxan Diesel, NT; Consulting Physician: Michael Boston, MD; Registered Nurse: Elliot Cousin, RN; Technician: Prudencio Burly, NT  Subjective:  Wife and daughter at bedside.  Rehabilitation staff intermittently out of the room.  Patient exercising during the day.  Cycling parenteral nutrition and trophic tube feeds so can be off for eight hours to allow rehabilitation.  Patient tired but feeling a little better.  Objective:  Vital signs:  Vitals:   03/10/16 0500 03/10/16 1128 03/10/16 1407 03/11/16 0423  BP:  126/71 134/69 (!) 110/46  Pulse:  78 99 85  Resp:   18 18  Temp:   97.7 F (36.5 C) 97.6 F (36.4 C)  TempSrc:   Oral Oral  SpO2:   98% 98%  Weight: 71.5 kg (157 lb 11.2 oz)     Height:        Last BM Date: 03/10/16  Intake/Output   Yesterday:  01/25 0701 - 01/26 0700 In: 20 [I.V.:20] Out: 2025 [Urine:900; Drains:1125] This shift:  No intake/output data recorded.  Bowel function:  Flatus: YES  BM:  YES  Drain: Thin succus in EC bag.  Perc drain scant thin serous   Physical Exam:  General: Pt awake/alert/oriented x4 in No acute distress.  Tired but not toxic.  More color on the face.  More interactive Eyes: PERRL, normal EOM.  Sclera clear.  No icterus Neuro: CN II-XII intact w/o focal sensory/motor deficits. Lymph: No head/neck/groin lymphadenopathy Psych:  No delerium/psychosis/paranoia.  Not agitated.  No evidence of dementia. HENT: Normocephalic, Mucus membranes moist.  No thrush.  I removed his anterior neck dressing.  Tracheostomy site closed Neck: Supple, No tracheal deviation Chest: No chest wall pain w good excursion CV:  Pulses intact.  Regular  rhythm MS: Normal AROM mjr joints.  No obvious deformity  Abdomen: Somewhat firm.  Nondistended.  Tenderness at G tube & drain site only.  No evidence of peritonitis.  No incarcerated hernias.  Gastrostomy tube site heal down and close.  No necrotic tissue.  Left lower quadrant percutaneous drain site clean.  Minimal output.  I removed the perc drain.  He tolerated well.  Midline incision closing down.  Much smaller superior wound controlled with Eakin's pouch consistent with closing incision and persistent low volume I dictated fistula.  Much improved  Ext:  SCDs BLE.  No mjr edema.  No cyanosis Skin: No petechiae / purpura  Results:   Labs: Results for orders placed or performed during the hospital encounter of 02/26/16 (from the past 48 hour(s))  Glucose, capillary     Status: Abnormal   Collection Time: 03/09/16 12:06 PM  Result Value Ref Range   Glucose-Capillary 102 (H) 65 - 99 mg/dL   Comment 1 Notify RN   Glucose, capillary     Status: None   Collection Time: 03/09/16  4:54 PM  Result Value Ref Range   Glucose-Capillary 75 65 - 99 mg/dL   Comment 1 Notify RN   Glucose, capillary     Status: Abnormal   Collection Time: 03/09/16  8:41 PM  Result Value Ref Range   Glucose-Capillary 176 (H) 65 - 99 mg/dL  Glucose, capillary     Status: Abnormal   Collection Time: 03/10/16 12:11 AM  Result Value Ref Range   Glucose-Capillary 155 (H) 65 - 99 mg/dL  Glucose, capillary     Status: Abnormal   Collection Time: 03/10/16  4:42 AM  Result Value Ref Range   Glucose-Capillary 150 (H) 65 - 99 mg/dL  Comprehensive metabolic panel     Status: Abnormal   Collection Time: 03/10/16  5:06 AM  Result Value Ref Range   Sodium 137 135 - 145 mmol/L   Potassium 3.9 3.5 - 5.1 mmol/L   Chloride 110 101 - 111 mmol/L   CO2 23 22 - 32 mmol/L   Glucose, Bld 135 (H) 65 - 99 mg/dL   BUN 27 (H) 6 - 20 mg/dL   Creatinine, Ser 0.54 (L) 0.61 - 1.24 mg/dL   Calcium 7.9 (L) 8.9 - 10.3 mg/dL   Total  Protein 5.5 (L) 6.5 - 8.1 g/dL   Albumin 1.4 (L) 3.5 - 5.0 g/dL   AST 40 15 - 41 U/L   ALT 41 17 - 63 U/L   Alkaline Phosphatase 167 (H) 38 - 126 U/L   Total Bilirubin 0.3 0.3 - 1.2 mg/dL   GFR calc non Af Amer >60 >60 mL/min   GFR calc Af Amer >60 >60 mL/min    Comment: (NOTE) The eGFR has been calculated using the CKD EPI equation. This calculation has not  been validated in all clinical situations. eGFR's persistently <60 mL/min signify possible Chronic Kidney Disease.    Anion gap 4 (L) 5 - 15  Magnesium     Status: None   Collection Time: 03/10/16  5:06 AM  Result Value Ref Range   Magnesium 1.7 1.7 - 2.4 mg/dL  Phosphorus     Status: None   Collection Time: 03/10/16  5:06 AM  Result Value Ref Range   Phosphorus 4.1 2.5 - 4.6 mg/dL  CBC     Status: Abnormal   Collection Time: 03/10/16  5:06 AM  Result Value Ref Range   WBC 7.2 4.0 - 10.5 K/uL   RBC 2.83 (L) 4.22 - 5.81 MIL/uL   Hemoglobin 8.2 (L) 13.0 - 17.0 g/dL   HCT 25.9 (L) 39.0 - 52.0 %   MCV 91.5 78.0 - 100.0 fL   MCH 29.0 26.0 - 34.0 pg   MCHC 31.7 30.0 - 36.0 g/dL   RDW 16.0 (H) 11.5 - 15.5 %   Platelets 313 150 - 400 K/uL  Glucose, capillary     Status: Abnormal   Collection Time: 03/10/16  8:52 AM  Result Value Ref Range   Glucose-Capillary 106 (H) 65 - 99 mg/dL  Glucose, capillary     Status: None   Collection Time: 03/10/16 12:10 PM  Result Value Ref Range   Glucose-Capillary 94 65 - 99 mg/dL  Glucose, capillary     Status: None   Collection Time: 03/10/16  4:37 PM  Result Value Ref Range   Glucose-Capillary 71 65 - 99 mg/dL  Glucose, capillary     Status: Abnormal   Collection Time: 03/10/16  7:20 PM  Result Value Ref Range   Glucose-Capillary 133 (H) 65 - 99 mg/dL  Glucose, capillary     Status: Abnormal   Collection Time: 03/11/16  4:07 AM  Result Value Ref Range   Glucose-Capillary 129 (H) 65 - 99 mg/dL    Imaging / Studies: No results found.  Medications / Allergies: per  chart  Antibiotics: Anti-infectives    Start     Dose/Rate Route Frequency Ordered Stop   03/08/16 1730  erythromycin ethylsuccinate (EES) 200 MG/5ML suspension 200 mg     200 mg Per Tube Every 8 hours 03/08/16 1634     02/29/16 2000  cefTRIAXone (ROCEPHIN) 2 g in dextrose 5 % 50 mL IVPB     2 g 100 mL/hr over 30 Minutes Intravenous Every 24 hours 02/29/16 1804 03/02/16 2017   02/27/16 2000  cefTRIAXone (ROCEPHIN) 2 g in dextrose 5 % 50 mL IVPB  Status:  Discontinued     2 g 100 mL/hr over 30 Minutes Intravenous Every 24 hours 02/26/16 2137 02/29/16 1804        Note: Portions of this report may have been transcribed using voice recognition software. Every effort was made to ensure accuracy; however, inadvertent computerized transcription errors may be present.   Any transcriptional errors that result from this process are unintentional.     [name], M.D., F.A.C.S. Gastrointestinal and Minimally Invasive Surgery [facility] [address], [place], [place] [address] [phone] Main / Paging   03/11/2016

## 2016-03-11 NOTE — Evaluation (Signed)
Bedside Swallow Assessment, Plan, and Daily Treatment Note   Patient Details  Name: [name] MRN: [id number] Date of Birth: 02-23-1940  SLP Diagnosis: Cognitive Impairments;Dysarthria;Dysphagia  Rehab Potential: Fair ELOS: 14 to 19    Today's Date: 03/11/2016 SLP Individual Time: 3646-8032 SLP Individual Time Calculation (min): 40 min   Problem List:  Patient Active Problem List   Diagnosis Date Noted  . Gastrojejunostomy tube 02/06/2016 03/10/2016  . Jejunal anastomotic leak 03/10/2016  . Hyponatremia   . Anxiety and depression   . Sleep disturbance   . Orthostasis   . Leukocytosis   . Hyperglycemia   . FTT (failure to thrive) in adult   . Post-operative pain   . 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 Anemia of chronic disease 01/31/2016  . Stage 2 skin ulcer of sacral region 01/31/2016  . Hypomagnesemia 01/21/2016  . Hypokalemia 01/21/2016  . Pressure injury of skin 01/20/2016  . Pleural effusion   . Gastroesophageal reflux disease 01/19/2016  . Insomnia 01/19/2016  . On total parenteral nutrition (TPN) 01/17/2016  . Metabolic acidosis 02/06/8249  . Skin ulcer of abdominal wall, limited to breakdown of skin ([place]) 01/17/2016  . Dysphagia 01/17/2016  . Debility 01/17/2016  . 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 Vitamin B12 deficiency 01/02/2016  . BPH (benign prostatic hyperplasia)   . Posterior vitreous detachment   . Salzmann's nodular dystrophy of left eye   . Hyperopia of both eyes with astigmatism and presbyopia   . Central pterygium of right eye   . Pressure ulcer of buttock, right, unstageable ([place]) 12/30/2015  . Hypernatremia 12/28/2015  . Tobacco abuse 12/09/2015  . Gastritis 12/09/2015  . Acute respiratory failure with hypoxemia ([place])   . Protein-calorie malnutrition, severe 12/04/2015  . Encephalopathy acute 12/03/2015  . Anxiety state 09/28/2015  . Hyperlipidemia   . Acquired stricture of pylorus  s/p vagatomy & distal gastrectomy 09/25/2015 09/25/2015  . Coronary atherosclerosis of native coronary artery 04/24/2013  . Essential hypertension, benign 04/24/2013  . Other and unspecified hyperlipidemia 04/24/2013  . Central pterygium 10/30/2012  . 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 Cataract, nuclear sclerotic senile 10/30/2012   Past Medical History:  Past Medical History:  Diagnosis Date  . Aspiration pneumonia ([place]) 01/17/2016  . Bilateral dry eyes   . Bile peritonitis due to gastric tube dislodgement 12/03/2015  . BPH (benign prostatic hyperplasia)   . Central pterygium of right eye   . Chronic respiratory failure with hypoxia ([place])   . Coronary artery disease 1997, 2009   bare mental stent right coronart artery, occlusive followed by Dr. Pernell Dupre in [place]  . Degenerative disc disease    LOWER BACK  . Dysphonia   . ED (erectile dysfunction)   . Gastric outlet obstruction 11/24/2015  . Gastroesophageal reflux disease   . Hyperlipidemia   . Hyperopia of both eyes with astigmatism and presbyopia   . Hypertension   . Ischemic right index finger at tip 12/09/2015  . Nuclear senile cataract    Dr. [facility] in [place]  . Peptic ulcer   . Pneumatosis of intestines 12/11/2015  . Pneumonia    HISTORY OF IN CHILDHOOD  . Posterior vitreous detachment 10/30/2012  . Pyloric stricture 11/24/2015  . Salzmann's nodular dystrophy of left eye   . SBO (small bowel obstruction) 01/20/2016  . Urinary hesitancy   . Wears glasses    Past Surgical History:  Past Surgical History:  Procedure Laterality  Date  . CARDIAC CATHETERIZATION    . CHOLECYSTECTOMY    . ESOPHAGOGASTRODUODENOSCOPY N/A 12/01/2015   Procedure: ESOPHAGOGASTRODUODENOSCOPY (EGD) BALLOON DILATION OF DUODENAL STRICTURE;  Surgeon: Michael Boston, MD;  Location: [facility];  Service: General;  Laterality: N/A;  . ESOPHAGOGASTRODUODENOSCOPY N/A 12/14/2015   Procedure:  ESOPHAGOGASTRODUODENOSCOPY (EGD);  Surgeon: Clarene Essex, MD;  Location: [facility];  Service: Endoscopy;  Laterality: N/A;  Patient has tracheostomy  . ESOPHAGOGASTRODUODENOSCOPY (EGD) WITH PROPOFOL N/A 07/01/2015   Procedure: ESOPHAGOGASTRODUODENOSCOPY (EGD) WITH PROPOFOL;  Surgeon: Arta Silence, MD;  Location: [facility];  Service: Endoscopy;  Laterality: N/A;  . ESOPHAGOGASTRODUODENOSCOPY (EGD) WITH PROPOFOL N/A 11/23/2015   Procedure: ESOPHAGOGASTRODUODENOSCOPY (EGD) WITH PROPOFOL;  Surgeon: Arta Silence, MD;  Location: [facility] ENDOSCOPY;  Service: Endoscopy;  Laterality: N/A;  may need to intubate  . EYE SURGERY     growth on right eye removed   . IR GENERIC HISTORICAL  12/13/2015   IR REPLC DUODEN/JEJUNO TUBE PERCUT W/FLUORO 12/13/2015 Marybelle Killings, MD [facility]  . IR GENERIC HISTORICAL  02/03/2016   IR [place] GASTRO/COLONIC TUBE PERCUT W/FLUORO 02/03/2016 Greggory Keen, MD [facility]  . IR GENERIC HISTORICAL  02/04/2016   IR GASTR TUBE CONVERT GASTR-JEJ PER W/FL MOD SED 02/04/2016 Jacqulynn Cadet, MD [facility]  . IR GENERIC HISTORICAL  02/25/2016   IR SINUS/FIST TUBE CHK-NON GI 02/25/2016 Aletta Edouard, MD [facility]  . LAPAROSCOPIC GASTROSTOMY N/A 12/01/2015   Procedure: LAPAROSCOPIC PLACEMENT OF FEEDING JEJUNOSTOMY AND GASTROSTOMY TUBE;  Surgeon: Michael Boston, MD;  Location: [facility];  Service: General;  Laterality: N/A;  . LAPAROSCOPY N/A 12/03/2015   Procedure: LAPAROSCOPY DIAGNOSTIC, OMENTOPEXY, JEJUNOSTOMY, [place] OUT;  Surgeon: Michael Boston, MD;  Location: [facility];  Service: General;  Laterality: N/A;  . LAPAROTOMY N/A 02/06/2016   Procedure: EXPLORATORY LAPAROTOMY, LYSIS OF ADHESIONS, SEGMENTAL SMALL BOWEL RESECTION, GASTROJEJUNOSTOMY;  Surgeon: Autumn Messing III, MD;  Location: [facility];  Service: General;  Laterality: N/A;  . STENT TO HEART     2 1997 AND 1 IN 1996  . TONSILLECTOMY    . XI ROBOTIC VAGOTOMY AND ANTRECTOMY N/A 09/25/2015   Procedure: XI ROBOTIC ANTERIOR AND  POSTERIOR VAGOTOMY, BILROTH I  ANASTOMOSIS DOR FUNDIPLICATION, OMENTOPEXY  UPPER ENDOSCOPY;  Surgeon: Michael Boston, MD;  Location: [facility];  Service: General;  Laterality: N/A;    Assessment / Plan / Recommendation Clinical Impression   Lott Seelbach is a 76 year old male with history of CAD, chronic back pain, COPD, GERD with dysphonia, PUD due to ulcer and abdominal pain who underwent distal gastrectomy with vagotomy 09/2015 due to acquired stricture. Post op course complicated by gastric ileus requiring jejunostomy, encephalopathy and FTT. He developed gastric leak due to jejunal disruption after pulling out his tube and required wash out with omentopexy 10/19, VDRF --tracheostomy, TPN as well as tube feeds and discharged to SNF for care and monitoring. He was readmitted on 01/19/16 due to abdominal pain with severe vomiting and unable to tolerate oral intake. He was found to have partial SBO treated conservatively without improvement and taken to OR on 12/23 for exp lap with lysis of adhesions, segmental SB resection and gastrojejunostomy. He developed drainage of enteric contents from midline incision on 12/29 and was found to have LLQ abscess with EC fistula requiring percutaneous drain placed by IVR on 12/31. Drainage from EC fistula from midline incision managed with use of [place] pouch. Intraabdominal wound cultures positive for moderate Klebsiella pneumoniae, moderate E coli and moderate bacteriodes thetaiotaomicron betalactamase positive. Respiratory  status stable without significant secretions and patient decannulated on 01/10. Dr. Johney Maine feels that fistula likely gastrojejunostomy and should heal with bowel rest --NPO and TNA to help with nutritional supplement. Surgery if fistula present at 6-12 months. LLQ drain injection 01/11 revealed fistula to SB--JP drain changed out to gravity bag. Antibiotics narrowed to Rocephin on 01/3 Mentation improving with confusion resolving and ongoing anxiety/agitation  at nights. Therapy ongoing and patient showing improvement in activity tolerance and mobility. CIR recommended by rehab team.  Pt now cleared for trials of POs with SLP per surgical team.  As a result, MBS and BSE ordered to determine readiness for advancement.  Results are as follows:  Pt presents with s/s of a moderately severe oropharyngeal dysphagia that appears pretty consistent with most recent MBS despite study having been completed >1 month prior.  Pt demonstrates what appears to be a delay in swallow initiation and weak hyolaryngeal elevation per palpation which is followed by multiple swallows and delayed coughing (with thin liquids via cup sips) and is concerning for ongoing silent aspiration during and after the swallow.  No overt s/s of aspiration were evident with ice chips.  Recommend that pt remain on the ice chips protocol following oral care to continue working towards diet progression.  Will defer MBS at this time until pt can demonstrate improved clinical s/s of airway protection at bedside.     Skilled Therapeutic Interventions          Bedside swallow evaluation completed with results and recommendations reviewed with pt and family.  Reviewed results of previous MBS and how they compare with today's clinical presentation with pt's daughter.   [name] is in agreement with deferring MBS at this time given ongoing risk of aspiration.  Pt overall appeared very disinterested in consuming thin liquids and ice chips despite prolonged NPO status and needed mod encouragement for intake during assessment.  Pt handed off to PT.  All questions answered to pt's and daughter's satisfaction at this time.      SLP Assessment  Patient will need skilled Speech Lanaguage Pathology Services during CIR admission    Recommendations  SLP Diet Recommendations: NPO;Ice chips PRN after oral care Compensations: Slow rate;Small sips/bites;Multiple dry swallows after each bite/sip;Chin tuck;Other  (Comment) Postural Changes and/or Swallow Maneuvers: Seated upright 90 degrees Oral Care Recommendations: Oral care QID Recommendations for Other Services: Neuropsych consult Patient destination: Home Follow up Recommendations: 24 hour supervision/assistance;Home Health SLP;Outpatient SLP;Skilled Nursing facility Equipment Recommended: To be determined    SLP Frequency 3 to 5 out of 7 days   SLP Duration  SLP Intensity  SLP Treatment/Interventions 14 to 18  Minumum of 1-2 x/day, 30 to 90 minutes  Cognitive remediation/compensation;Cueing hierarchy;Speech/Language facilitation;Multimodal communication approach;Patient/family education;Therapeutic Activities;Dysphagia/aspiration precaution training    Pain Pain Assessment Pain Assessment: No/denies pain  Prior Functioning    Function:  Eating Eating   Modified Consistency Diet:  (NPO with JG tube ) Eating Assist Level: Supervision or verbal cues (during trials with SLP )           Cognition Comprehension Comprehension assist level: Follows basic conversation/direction with extra time/assistive device  Expression   Expression assist level: Expresses basic 75 - 89% of the time/requires cueing 10 - 24% of the time. Needs helper to occlude trach/needs to repeat words.  Social Interaction Social Interaction assist level: Interacts appropriately 75 - 89% of the time - Needs redirection for appropriate language or to initiate interaction.  Problem Solving Problem solving assist  level: Solves basic 50 - 74% of the time/requires cueing 25 - 49% of the time  Memory Memory assist level: Recognizes or recalls 50 - 74% of the time/requires cueing 25 - 49% of the time   Short Term Goals: Week 1: SLP Short Term Goal 1 (Week 1): Pt will utilize speech intelligibility strategies at the pharse level with Mod A verbal cues to achieve 75% intelligibility.  SLP Short Term Goal 1 - Progress (Week 1): Met SLP Short Term Goal 2 (Week 1): Pt  will complete basic, familiar tasks iwth Mod A verbal cues for functional problem solving.  SLP Short Term Goal 2 - Progress (Week 1): Met SLP Short Term Goal 3 (Week 1): Pt will sustain attention to functional tasks for ~10 minutes with Mod A verbal cues.  SLP Short Term Goal 3 - Progress (Week 1): Progressing toward goal SLP Short Term Goal 4 (Week 1): Pt will utilize external memory aids to recall new, daily information with Mod A verbal cues.  SLP Short Term Goal 4 - Progress (Week 1): Progressing toward goal SLP Short Term Goal 5 (Week 1): Pt will consistently demonstrate O x 4 with Mod A verbal cues.  SLP Short Term Goal 5 - Progress (Week 1): Met  Refer to Care Plan for Long Term Goals  Recommendations for other services: Neuropsych  Discharge Criteria: Patient will be discharged from SLP if patient refuses treatment 3 consecutive times without medical reason, if treatment goals not met, if there is a change in medical status, if patient makes no progress towards goals or if patient is discharged from hospital.  The above assessment, treatment plan, treatment alternatives and goals were discussed and mutually agreed upon: by patient and by family  Koreen Lizaola, Selinda Orion 03/11/2016, 4:21 PM

## 2016-03-11 NOTE — Progress Notes (Signed)
PHARMACY - TOTAL PARENTERAL NUTRITION CONSULT NOTE   Pharmacy Consult:  TPN Indication:  SBO and new EC fistula 02/13/16  Patient Measurements: Height: 6' 2"  (188 cm) Weight: 157 lb 11.2 oz (71.5 kg) IBW/kg (Calculated) : 82.2   Body mass index is 20.25 kg/m.  Assessment:  78 YOM with history of multiple procedures and extended hospitalization for recurrent partial GOO 2/2 pyloric stricture.  Patient was on TPN for several days, then discharged on 12/29/16 with J/G tube in place for TF. He was unable to tolerate TF and was transitioned back to TPN by [facility] on 01/05/17.  Patient was readmitted on 01/19/17 with partial SBO.  Patient failed MBS on 01/25/17 and did not tolerate bolus feeds through G-tube on 01/26/17. On 12/23, he underwent ex-lap with LoA, segmental SBR, and gastrojejunostomy. On 02/11/14, TF were found draining from midline incision with CT abd showing bowel perforation at [place] sit and development of abscesses.   GI: Prealbumin WNL at 24.  1/16 imaging showed EC fistula.  Drain O/P 149m.  Bisacodyl PR QOD, LBM 1/25.  Unable to advance TF to 15 ml/hr, erythromycin started 1/23.  Fistula is smaller per documentation, concerned that TF is in Gtube bag. Endo: no hx DM - CBGs adequately controlled (CBGs 75-102 off TPN, 135-176 on TPN) Insulin requirements in the past 24 hours:  SSI d/c'ed 1/14 Lytes: all WNL (Mag decreased despite supplementation) Renal: SCr 0.54, CrCL ~80 ml/min - good UOP 0.5 ml/kg/hr Pulm: stable on RA - nicotine patch Cards: orthostatic hypotension improved  Hepatobil: LFTs WNL except alk phos.  TG WNL. Neuro: Prozac, Xanax, lidocaine patch, PRN morphine ID: s/p CTX 1/13 > 1/17 for UTI - afebrile, WBC 7.2 Best Practices: Lovenox, MC TPN Access: PICC TPN start date: [facility] > [facility] 01/20/16 > [facility] 1/12  Home TPN: 2156 kCal and 75gm protein per day (405g CHO = 1376 kCal, 50g lipid = 480 kCal)  Nutritional Goals: 2100 - 2300 kCal and 95 - 110 g protein per  day  Current Nutrition:  Jevity 1.2 at 10 ml/hr for 16 hrs (192 kCal, 9 gm protein) Clinimix + lipid twice weekly (daily average of 2177 kCal, 85g protein)   Plan:  - Jevity 1.2 at 10 ml/hr over 16 hours (1600 to 0800) - Continue Clinimix E 5/15, infuse 2000 mls over 12 hrs:  50 ml/hr x 1 hr, then 190 ml/hr x 10 hrs, then 50 mL/hr x 1 hr.  Continue lipid 20 ml/hr x 12 hrs. - Clinimix and TF at 10 ml/hr will provide 2092 kCal and 109gm protein per day, meeting ~100% of patient's needs. - Multivitamin PT daily.  Trace elements every other day because med is on critical shortage (none today).  - F/U with TF tolerance to advance   JRenold Genta PharmD, BCPS Clinical Pharmacist Phone for today - xRiley- [phone]/26/2018 7:44 AM

## 2016-03-11 NOTE — Progress Notes (Signed)
[facility] Progress Note     Subjective: Sitting up in chair. Denies abdominal pain. Having flatus. EC fistula output decreasing.  EC fistula output - 25 cc/24h  G-tube to drainage 1,375 cc/24h  Objective: Vital signs in last 24 hours: Temp:  [97.6 F (36.4 C)-97.7 F (36.5 C)] 97.6 F (36.4 C) (01/26 0423) Pulse Rate:  [78-99] 85 (01/26 0423) Resp:  [18] 18 (01/26 0423) BP: (110-134)/(46-71) 110/46 (01/26 0423) SpO2:  [98 %] 98 % (01/26 0423) Last BM Date: 03/10/16  Intake/Output from previous day: 01/25 0701 - 01/26 0700 In: 20 [I.V.:20] Out: 2300 [Urine:900; Drains:1400] Intake/Output this shift: No intake/output data recorded.  PE: Gen:  Alert, NAD, pleasant Card:  RRR Pulm:  CTA, no W/R/R Abd: Soft, NT/ND, +BS, EC fistula decreasing in size - pouch in place with good seal. G tube site c/d/i  Lab Results:   Recent Labs  03/10/16 0506  WBC 7.2  HGB 8.2*  HCT 25.9*  PLT 313   BMET  Recent Labs  03/09/16 0507 03/10/16 0506  NA 134* 137  K 3.8 3.9  CL 107 110  CO2 22 23  GLUCOSE 162* 135*  BUN 27* 27*  CREATININE 0.57* 0.54*  CALCIUM 8.1* 7.9*   PT/INR No results for input(s): LABPROT, INR in the last 72 hours. CMP     Component Value Date/Time   NA 137 03/10/2016 0506   NA 139 01/14/2016   K 3.9 03/10/2016 0506   CL 110 03/10/2016 0506   CO2 23 03/10/2016 0506   GLUCOSE 135 (H) 03/10/2016 0506   BUN 27 (H) 03/10/2016 0506   BUN 23 (A) 01/14/2016   CREATININE 0.54 (L) 03/10/2016 0506   CALCIUM 7.9 (L) 03/10/2016 0506   PROT 5.5 (L) 03/10/2016 0506   ALBUMIN 1.4 (L) 03/10/2016 0506   AST 40 03/10/2016 0506   ALT 41 03/10/2016 0506   ALKPHOS 167 (H) 03/10/2016 0506   BILITOT 0.3 03/10/2016 0506   GFRNONAA >60 03/10/2016 0506   GFRAA >60 03/10/2016 0506   Lipase     Component Value Date/Time   LIPASE 55 (H) 01/20/2016 0158       Studies/Results: No results found.  Anti-infectives: Anti-infectives    Start      Dose/Rate Route Frequency Ordered Stop   03/08/16 1730  erythromycin ethylsuccinate (EES) 200 MG/5ML suspension 200 mg     200 mg Per Tube Every 8 hours 03/08/16 1634     02/29/16 2000  cefTRIAXone (ROCEPHIN) 2 g in dextrose 5 % 50 mL IVPB     2 g 100 mL/hr over 30 Minutes Intravenous Every 24 hours 02/29/16 1804 03/02/16 2017   02/27/16 2000  cefTRIAXone (ROCEPHIN) 2 g in dextrose 5 % 50 mL IVPB  Status:  Discontinued     2 g 100 mL/hr over 30 Minutes Intravenous Every 24 hours 02/26/16 2137 02/29/16 1804       Assessment/Plan EC fistula - located at SB jejunal anastomosis - clinically improving, smaller wound/pouch and lower output - continues to tolerate continuous TF's at30mL/hr; continue erythromycin  - perc drain removed 1/25 - continue only liquid medications via J tube and no crushed meds - continue G-tube to gravity  FEN - NPO, IVF, TPN, TF at night VTE - Lovenox   Plan - Repeat barium swallow today - if passes patient may have limited clears (1200 mL) and ice chips, per Dr. Michaell Cowing. Maintain G-tube to gravity. Will continue TF to 10 cc/hr and likely increase on  Monday 1/29.     LOS: 14 days    Adam PhenixElizabeth S Simaan , [name] 03/11/2016, 11:24 AM Pager: [phone] Consults: [phone] Mon-Fri 7:00 am-4:30 pm Sat-Sun 7:00 am-11:30 am

## 2016-03-11 NOTE — Progress Notes (Signed)
[facility] PHYSICAL MEDICINE & REHABILITATION     PROGRESS NOTE  Subjective/Complaints: Pt seen laying in bed this AM.  He states he slept well overnight, but has abdominal pain this AM.  Per nursing, pt has not complained of abdominal pain and slept well overnight.    ROS: Denies vomiting, diarrhea, shortness of breath or chest pain  Objective: Vital Signs: Blood pressure (!) 110/46, pulse 85, temperature 97.6 F (36.4 C), temperature source Oral, resp. rate 18, height 6\' 2"  (1.88 m), weight 71.5 kg (157 lb 11.2 oz), SpO2 98 %. No results found.  Recent Labs  03/10/16 0506  WBC 7.2  HGB 8.2*  HCT 25.9*  PLT 313    Recent Labs  03/09/16 0507 03/10/16 0506  NA 134* 137  K 3.8 3.9  CL 107 110  GLUCOSE 162* 135*  BUN 27* 27*  CREATININE 0.57* 0.54*  CALCIUM 8.1* 7.9*   CBG (last 3)   Recent Labs  03/10/16 1920 03/11/16 0407 03/11/16 0806  GLUCAP 133* 129* 107*    Wt Readings from Last 3 Encounters:  03/10/16 71.5 kg (157 lb 11.2 oz)  02/26/16 78.6 kg (173 lb 4.5 oz)  01/19/16 79.2 kg (174 lb 8 oz)    Physical Exam:  BP (!) 110/46 (BP Location: Left Arm)   Pulse 85   Temp 97.6 F (36.4 C) (Oral)   Resp 18   Ht 6\' 2"  (1.88 m)   Wt 71.5 kg (157 lb 11.2 oz)   SpO2 98%   BMI 20.25 kg/m  Constitutional: NAD. Vital signs reviewed. Frail.  HENT: Normocephalic and atraumatic. +Stoma. Eyes: EOMI. No discharge.  Cardiovascular: RRR. No JVD. Respiratory: CTA bilaterally. Unlabored.  GI: He exhibits no distension. BS+. +Eakins pouch  Musculoskeletal: He exhibits no edema. He exhibits no tenderness.  Neurological: He is alert.  Sitting balance is good Speech with dysarthria, improving  Motor: 4-/5 throughout (stable) Skin: Skin is warm and dry. Abdominal wounds with dressing.   Assessment/Plan: 1. Functional deficits secondary to debility and encephalopathy which require 3+ hours per day of interdisciplinary therapy in a comprehensive inpatient rehab  setting. Physiatrist is providing close team supervision and 24 hour management of active medical problems listed below. Physiatrist and rehab team continue to assess barriers to discharge/monitor patient progress toward functional and medical goals.  Function:  Bathing Bathing position Bathing activity did not occur: Refused Position: Wheelchair/chair at sink  Bathing parts Body parts bathed by patient: Right arm, Left arm, Chest, Front perineal area, Right upper leg, Left upper leg Body parts bathed by helper: Right lower leg, Buttocks, Back, Left lower leg  Bathing assist Assist Level: Touching or steadying assistance(Pt > 75%)      Upper Body Dressing/Undressing Upper body dressing Upper body dressing/undressing activity did not occur: Safety/medical concerns What is the patient wearing?: Pull over shirt/dress     Pull over shirt/dress - Perfomed by patient: Thread/unthread right sleeve, Thread/unthread left sleeve, Put head through opening Pull over shirt/dress - Perfomed by helper: Pull shirt over trunk        Upper body assist Assist Level: Touching or steadying assistance(Pt > 75%)      Lower Body Dressing/Undressing Lower body dressing Lower body dressing/undressing activity did not occur: Safety/medical concerns What is the patient wearing?: Pants, Shoes, [employer]- Performed by patient: Thread/unthread right pants leg Pants- Performed by helper: Thread/unthread right pants leg, Thread/unthread left pants leg, Pull pants up/down, Fasten/unfasten pants   Non-skid slipper  socks- Performed by helper: Don/doff left sock, Don/doff right sock   Socks - Performed by helper: Don/doff right sock, Don/doff left sock   Shoes - Performed by helper: Don/doff right shoe, Don/doff left shoe       TED Hose - Performed by helper: Don/doff right TED hose, Don/doff left TED hose  Lower body assist Assist for lower body dressing:  (Max A)      Toileting Toileting  Toileting activity did not occur: N/A Toileting steps completed by patient: Performs perineal hygiene Toileting steps completed by helper: Adjust clothing prior to toileting, Adjust clothing after toileting, Performs perineal hygiene Toileting Assistive Devices: Grab bar or rail  Toileting assist Assist level:  (total assist)   Transfers Chair/bed transfer   Chair/bed transfer method: Stand pivot Chair/bed transfer assist level: Moderate assist (Pt 50 - 74%/lift or lower) Chair/bed transfer assistive device: Armrests, Patent attorneyWalker     Locomotion Ambulation     Max distance: 140 ft Assist level: Touching or steadying assistance (Pt > 75%)   Wheelchair   Type: Manual Max wheelchair distance: 80 ft Assist Level: Dependent (Pt equals 0%) (time management and conserve energy)  Cognition Comprehension Comprehension assist level: Follows basic conversation/direction with extra time/assistive device  Expression Expression assist level: Expresses basic 75 - 89% of the time/requires cueing 10 - 24% of the time. Needs helper to occlude trach/needs to repeat words.  Social Interaction Social Interaction assist level: Interacts appropriately 90% of the time - Needs monitoring or encouragement for participation or interaction.  Problem Solving Problem solving assist level: Solves basic 50 - 74% of the time/requires cueing 25 - 49% of the time  Memory Memory assist level: Recognizes or recalls 50 - 74% of the time/requires cueing 25 - 49% of the time    Medical Problem List and Plan: 1. Debility and encephalopathy secondary to small bowel obstruction and associated complications   Cont CIR   Surgery evaluated, TFs per Surgery, now plan for swallow study and oral intake based on results of study and then tube outpt.  Will cont to monitor.   No crushed meds through feeding tube 2. DVT Prophylaxis/Anticoagulation: Pharmaceutical: Lovenox  3. Pain Management: Changed PCA to IV morphine every 4 hours prn  severe pain   pain in general has been better controlled. Pt aware there will be ebbs and flows 4. Mood: team to provide ego support. LCSW to follow for evaluation and support.   Prozac started 1/18 5. Neuropsych: This patient is not fully capable of making decisions on his own behalf.  6. Skin/Wound Care: Continue air mattress overlay. Continue large Eakin's pouch to manage wound drainage. WOC for management. Encourage side lying to help manage MASD.  7. Malnutrition/Fluids/Electrolytes/Nutrition: continue TPN.   Albumin continues to trend down 8. Abdominal abscess: Leucocytosis resolving. Completed rocephin 1/17 9. Anemia of chronic illness:   Hb 8.2 on 1/25 (stable)  Cont to monitor 10. Electrolyte abnormality/Acute renal insufficiency: Managed by TNA   Mag improved to 1/25 11. Encephalopathy: Changed haldol to IM prn for management of agitation/anxiety.  12. Hyperglycemia  Due to TFs  Slightly labile, overall controlled 1/26 13. Leukocytosis: Stable  WBC 7.2 on 03/10/2016  Afebrile  Ucx NG 14. Orthostasis: Improved 15. Sleep disturbance  Has tried Haldol without benefit, ativan causes hallucinations  Continue Xanax elixer, no meds per tube 16. Mild hyponatremia: Improving  Na+ 137 on 1/25  LOS (Days) 14 A FACE TO FACE EVALUATION WAS PERFORMED  Saren Corkern Karis Jubanil Ulrick Methot 03/11/2016 10:15 AM

## 2016-03-11 NOTE — Progress Notes (Signed)
Physical Therapy Weekly Progress Note  Patient Details  Name: [name] MRN: [id number] Date of Birth: 05/08/1940  Beginning of progress report period: March 04, 2016 End of progress report period: March 11, 2016  Today's Date: 03/11/2016 PT Individual Time: 0802-0902 and 1345 - 1414  PT Individual Time Calculation (min): 60 min and 29 min  Patient has met 2 of 5 short term goals. Pt is improving with assist level required for bed mobility, transfers, and gait due to increase strength and endurance as seen in function tab. Pt continues to be limited due to dizziness that doesn't improve or worsen with activity, deconditioning, and cognitive impairments.     Patient continues to demonstrate the following deficits muscle weakness, decreased cardiorespiratoy endurance and decreased standing balance, decreased postural control and decreased balance strategies and therefore will continue to benefit from skilled PT intervention to increase functional independence with mobility.  Patient progressing toward long term goals..  Plan of care revisions: ambulation goal upgraded to supervision.  PT Short Term Goals Week 2:  PT Short Term Goal 1 (Week 2): Patient will perform bed mobility with supervision consistently PT Short Term Goal 1 - Progress (Week 2): Progressing toward goal PT Short Term Goal 2 (Week 2): Patient will perform sit-to-stand and stand pivot transfers using RW with min A and minimal cues   PT Short Term Goal 2 - Progress (Week 2): Progressing toward goal PT Short Term Goal 3 (Week 2): Patient will ambulate 75 ft with RW and min A PT Short Term Goal 3 - Progress (Week 2): Met PT Short Term Goal 4 (Week 2): Patient will maintain standing balance with min A for 3 minutes PT Short Term Goal 4 - Progress (Week 2): Progressing toward goal PT Short Term Goal 5 (Week 2): Patient will have initiated stair training  PT Short Term Goal 5 - Progress (Week 2): Met Week 3:  PT Short  Term Goal 1 (Week 3): Pt will ambulate 100 ft supervision with RW  PT Short Term Goal 2 (Week 3): Pt will demonstrate sit-to-stand and stand pivot transfers Supervision with RW consistently PT Short Term Goal 3 (Week 3): Pt will ascend and descend 3 standard steps with bilateral rails min A PT Short Term Goal 4 (Week 3): Pt will demonstrate ability to travel 100 ft supervision in W/C with minimal need for rest breaks before continung with therapy. PT Short Term Goal 5 (Week 3): Pt will demonstrate ability to perform BUE supported standing tolerance for 3 min consistently  Skilled Therapeutic Interventions/Progress Updates:  Session 1:  Pt was in bed upon arrival. Pt was complaining of no pain however was experiencing an upset stomach and dizziness of 7/10 throughout therapy. Nursing was aware prior to therapy.   Pt demonstrated supervision with supine to sitting with HOB elevated and using bedrails with max cues for handplacement and sequence.   Pt tolerated 10 minutes of static sitting at EOB to don ted hose, shoes, and pants. Pt required total assist with dressing.   Pt's stand pivot transfers throughout required mod A with lowering and progressed to CGA with standing as therapy continued. Verbal cues for sequence and handplacement for safety.   Pt ambulated 100 ft CGA with RW and verbal cues for posture and proper use of RW .  RW was adjusted to improve forward flexed posture.   Pt participated in single UE support standing tolerance with RW with lateral reaches beyond BOS using Velcro cards game. Pt stood for  2 min 30 sec before needing rest break due to fatigue and dizziness.   Pt was rolled back to room and remained in W/C with all needs within reach. Nursing was notified of condom catheter coming off during treatment.   Session 2:  Pt was in W/C with daughter upon arrival with no complaint of pain. Daughter was present throughout therapy to observe pt's progress. Pt was complaining of  dizziness that continued throughout therapy.   Ambulatory and stand pivot transfers supervision to min A with RW and verbal cues for proper use of RW and sequencing throughout treatment.     Pt performed car transfer with Mod A with lowering into car chair and to prevent LOB during pivot from W/C. Pivot into and out of car was supervision with min verbal cues for sequence. Pt required a rest break before exiting car due to dizziness.   Pt self-propelled in W/C 80 ft to return to room.   Pt was left in W/C with daughter and all needs within reach.     Therapy Documentation Precautions:  Precautions Precautions: Fall Precaution Comments: NPO, gastrostomy tube, LLQ JP drain, abdominal surgical incision with pouch Restrictions Weight Bearing Restrictions: No  See Function Navigator for Current Functional Status.  Therapy/Group: Individual Therapy  [name] 03/11/2016, 7:31 AM

## 2016-03-11 NOTE — Progress Notes (Signed)
Occupational Therapy Weekly Progress Note  Patient Details  Name: [name] MRN: [id number] Date of Birth: 07/07/1940  Beginning of progress report period: February 27, 2016 End of progress report period: March 11, 2016  Today's Date: 03/11/2016 OT Individual Time: 1000-1100 OT Individual Time Calculation (min): 60 min    Patient has met 1 of 3 short term goals.  Pt has made progress towards all of his short term goals. He is able to complete bathing with mod A using LH sponge. He continues to require encouragement to participate in self-care tasks 2/2 learned helplessness from extended hospital stay. Pt tolerated ~ 3 minutes in standing during self-care tasks and is progressing to his standing endurance goal. LB dressing continues to be challenging to pt, but he is making progress towards LB dressing goal with encouragement and modified strategies. Pt continues to be limited by fatigue, but has demonstrated improved activity tolerance, requiring less rest breaks between ADLs. Continue current plan of care.   Patient continues to demonstrate the following deficits: muscle weakness, decreased activity tolerance, decreased initiation, decreased awareness, decreased memory and delayed processing,  decreased sitting balance, decreased standing balance, decreased postural control, decreased balance strategies and difficulty maintaining precautions and therefore will continue to benefit from skilled OT intervention to enhance overall performance with BADL and Reduce care partner burden.  Patient progressing toward long term goals..  Continue plan of care.  OT Short Term Goals Week 3:  OT Short Term Goal 1 (Week 3): Pt will tolerate 5 minutes of standing in preparation for ADL tasks OT Short Term Goal 2 (Week 3): Pt will complete LB dressing with Mod A and ADL AE OT Short Term Goal 3 (Week 3): Pt will recall energy conservation techniques with min verbal cues during ADL task  Skilled  Therapeutic Interventions/Progress Updates:    1:1 OT session focused on LB dressing techniques, dynamic sitting balance, sit<>stands, and activity tolerance. Stand-pivot transfers with RW and Mod A w/ focus on anterior weight shift. LB dressing completed with Mod/ Max A with focus on positioning and reaching while protecting abdominal incision. Pt then completed grooming tasks at the sink with encouragement and multiple rest breaks. Oral care completed with overall set-up- reiterated to pt not to swallow any water, suction ready, however pt still swallowed some water intentionally and had coughing and gagging s/p, RN notified.  Shaving completed with Min A and multiple rest breaks 2/2 UE fatigue.  OT educated on energy conservation techniques 2/2 fatigue with self-care tasks. Stand-pivot w/ RW to recliner w/ overall Min A. Pt left with needs met and family present.  Therapy Documentation Precautions:  Precautions Precautions: Fall Precaution Comments: NPO, gastrostomy tube, LLQ JP drain, abdominal surgical incision with pouch Restrictions Weight Bearing Restrictions: No Pain: Pain Assessment Pain Assessment: No/denies pain  See Function Navigator for Current Functional Status.   Therapy/Group: Individual Therapy  Valma Cava 03/11/2016, 4:29 PM

## 2016-03-12 ENCOUNTER — Inpatient Hospital Stay (HOSPITAL_COMMUNITY): Payer: Medicare Other | Admitting: Occupational Therapy

## 2016-03-12 DIAGNOSIS — F411 Generalized anxiety disorder: Secondary | ICD-10-CM

## 2016-03-12 LAB — GLUCOSE, CAPILLARY
GLUCOSE-CAPILLARY: 125 mg/dL — AB (ref 65–99)
GLUCOSE-CAPILLARY: 134 mg/dL — AB (ref 65–99)
Glucose-Capillary: 122 mg/dL — ABNORMAL HIGH (ref 65–99)
Glucose-Capillary: 101 mg/dL — ABNORMAL HIGH (ref 65–99)
Glucose-Capillary: 77 mg/dL (ref 65–99)

## 2016-03-12 MED ORDER — FAT EMULSION 20 % IV EMUL
240.0000 mL | INTRAVENOUS | Status: AC
  Administered 2016-03-12: 240 mL via INTRAVENOUS
  Filled 2016-03-12: qty 250

## 2016-03-12 MED ORDER — ADULT MULTIVITAMIN W/MINERALS CH: 1.0000 | ORAL_TABLET | Freq: Every day | ORAL | Status: DC

## 2016-03-12 MED ORDER — TRACE MINERALS CR-CU-MN-SE-ZN 10-1000-500-60 MCG/ML IV SOLN
INTRAVENOUS | Status: AC
  Administered 2016-03-12: 18:00:00 via INTRAVENOUS
  Filled 2016-03-12: qty 2000

## 2016-03-12 NOTE — Plan of Care (Signed)
Problem: RH BLADDER ELIMINATION Goal: RH STG MANAGE BLADDER WITH ASSISTANCE STG Manage Bladder With min Assistance   Outcome: Not Progressing [facility].

## 2016-03-12 NOTE — Progress Notes (Signed)
Refused supp. This  AM. Small incontinent stool during night. Alfredo MartinezMurray, Aleanna Menge A

## 2016-03-12 NOTE — Progress Notes (Signed)
PHARMACY - TOTAL PARENTERAL NUTRITION CONSULT NOTE   Pharmacy Consult:  TPN Indication:  SBO and new EC fistula 02/13/16  Patient Measurements: Height: 6' 2"  (188 cm) Weight: 160 lb (72.6 kg) IBW/kg (Calculated) : 82.2   Body mass index is 20.54 kg/m.  Assessment:  15 YOM with history of multiple procedures and extended hospitalization for recurrent partial GOO 2/2 pyloric stricture.  Patient was on TPN for several days, then discharged on 12/29/16 with J/G tube in place for TF. He was unable to tolerate TF and was transitioned back to TPN by [facility] on 01/05/17.  Patient was readmitted on 01/19/17 with partial SBO.  Patient failed MBS on 01/25/17 and did not tolerate bolus feeds through G-tube on 01/26/17. On 12/23, he underwent ex-lap with LoA, segmental SBR, and gastrojejunostomy. On 02/11/14, TF were found draining from midline incision with CT abd showing bowel perforation at [place] sit and development of abscesses.   GI: EC fistula at SB jejunal anastomosis >> smaller.  Prealbumin WNL at 24.  Drain O/P 744m.  Bisacodyl PR QOD, erythromycin started 1/23, BM x1.  Nauseous when TF rate advanced.  Failed 11/26 MBS. Endo: no hx DM - CBGs adequately controlled (CBGs 80-107 off TPN, 122-130 on TPN) Insulin requirements in the past 24 hours:  SSI d/c'ed 1/14 Lytes: 1/25 labs - all WNL  Renal: SCr 0.54, CrCL ~80 ml/min - good UOP 0.4 ml/kg/hr Pulm: stable on RA - nicotine patch Cards: orthostatic hypotension improved - VSS Hepatobil: LFTs WNL except alk phos.  TG WNL. Neuro: Prozac, Xanax, lidocaine patch, PRN morphine ID: s/p CTX 1/13 > 1/17 for UTI, UCx negative - afebrile, WBC WNL Best Practices: Lovenox, [facility] TPN Access: PICC TPN start date: [facility] > [facility] 01/20/16 > [facility] 02/26/15  Home TPN: 2156 kCal and 75gm protein per day (405g CHO = 1376 kCal, 50g lipid = 480 kCal)  Nutritional Goals: 2100 - 2300 kCal and 95 - 110 g protein per day  Current Nutrition:  Jevity 1.2 at 10 ml/hr for 16 hrs  (192 kCal, 9 gm protein) Clinimix + lipid twice weekly (daily average of 2177 kCal, 85g protein)   Plan:  - Per Surgery, Jevity 1.2 at 10 ml/hr over 16 hours (1600 to 0800).  Plan to increase to 20 ml/hr tomorrow or Monday. - Continue Clinimix E 5/15, infuse 2000 mls over 12 hrs:  50 ml/hr x 1 hr, then 190 ml/hr x 10 hrs, then 50 mL/hr x 1 hr.  Continue lipid 20 ml/hr x 12 hrs. - Clinimix and TF at 10 ml/hr will provide 2092 kCal and 109gm protein per day, meeting ~100% of patient's needs. - Multivitamin PT daily (none in TPN).  Trace elements every other day because med is on critical shortage (give today).  - F/U with TF tolerance to advance - Standard TPN labs on Mon and Thurs   Jaelee Laughter D. DMina Marble PharmD, BCPS Pager:  [phone]/27/2018, 7:25 AM

## 2016-03-12 NOTE — Progress Notes (Signed)
Occupational Therapy Session Note  Patient Details  Name: [name] MRN: [id number] Date of Birth: 02/14/1941  Today's Date: 03/12/2016 OT Individual Time: 1415-1445 OT Individual Time Calculation (min): 30 min    Short Term Goals: Week 2:  OT Short Term Goal 1 (Week 2): Pt will complete bathing with mod assist at sit > stand level OT Short Term Goal 1 - Progress (Week 2): Met OT Short Term Goal 2 (Week 2): Pt will tolerate 5 minutes of standing activity in preparation for ADL tasks OT Short Term Goal 2 - Progress (Week 2): Partly met OT Short Term Goal 3 (Week 2): Pt will complete LB dressing with Mod A OT Short Term Goal 3 - Progress (Week 2): Progressing toward goal Week 3:  OT Short Term Goal 1 (Week 3): Pt will tolerate 5 minutes of standing in preparation for ADL tasks OT Short Term Goal 2 (Week 3): Pt will complete LB dressing with Mod A and ADL AE OT Short Term Goal 3 (Week 3): Pt will recall energy conservation techniques with min verbal cues during ADL task  Skilled Therapeutic Interventions/Progress Updates:    Pt seen this session to facilitate sit to stand, standing balance/ tolerance and UE strength. Pt received in bed, sat to EOB with A.  Sit to stand to RW with slight steadying A. 4 trials for a max of 30 sec endurance 3x and a full minute 1x.  Pt sat EOB to engage in UE AROM exercises. His wife was present and also participated, encouraged wife to have pt work on these for a few minutes at a time when he does not have therapy. Worked on full upright sitting as pt has a forward flexed posture. Pt requested to lay back down. Adjusted in bed with all needs met.   Therapy Documentation Precautions:  Precautions Precautions: Fall Precaution Comments: NPO, gastrostomy tube, LLQ JP drain, abdominal surgical incision with pouch Restrictions Weight Bearing Restrictions: No    Pain: no c/o pain   ADL:    See Function Navigator for Current Functional  Status.   Therapy/Group: Individual Therapy  SAGUIER,JULIA 03/12/2016, 11:58 AM

## 2016-03-12 NOTE — Plan of Care (Signed)
Problem: RH BOWEL ELIMINATION Goal: RH STG MANAGE BOWEL WITH ASSISTANCE STG Manage Bowel with min Assistance.   Outcome: Not Progressing Incontinent, supp. Every other day.

## 2016-03-12 NOTE — Plan of Care (Signed)
Problem: RH SKIN INTEGRITY Goal: RH STG ABLE TO PERFORM INCISION/WOUND CARE W/ASSISTANCE STG Able To Perform Incision/Wound Care With min Assistance.   Outcome: Not Progressing Total assist to change dressings.

## 2016-03-12 NOTE — Progress Notes (Signed)
[name] is a 76 y.o. male 01/18/1941 [id number]  Subjective:  No new problems.   Objective: Vital signs in last 24 hours: Temp:  [98.2 F (36.8 C)-98.3 F (36.8 C)] 98.3 F (36.8 C) (01/27 1430) Pulse Rate:  [85] 85 (01/27 0437) Resp:  [18] 18 (01/27 1430) BP: (112-133)/(42-54) 112/42 (01/27 1430) SpO2:  [96 %-97 %] 96 % (01/27 1430) Weight:  [160 lb (72.6 kg)] 160 lb (72.6 kg) (01/27 0437) Weight change:  Last BM Date: 03/12/16 (small per night shift report)  Intake/Output from previous day: 01/26 0701 - 01/27 0700 In: -  Out: 1285 [Urine:550; Drains:735] Last cbgs: CBG (last 3)   Recent Labs  03/12/16 1117 03/12/16 1638 03/12/16 2020  GLUCAP 101* 77 134*     Physical Exam General: No apparent distress   HEENT: not dry Lungs: Normal effort. Lungs clear to auscultation, no crackles or wheezes. Cardiovascular: Regular rate and rhythm, no edema Abdomen: S/NT/ND; BS(+) Musculoskeletal:  unchanged Neurological: No new neurological deficits Wounds: w/tubes    Skin: clear  Aging changes Mental state: Alert, cooperative    Lab Results: BMET    Component Value Date/Time   NA 137 03/10/2016 0506   NA 139 01/14/2016   K 3.9 03/10/2016 0506   CL 110 03/10/2016 0506   CO2 23 03/10/2016 0506   GLUCOSE 135 (H) 03/10/2016 0506   BUN 27 (H) 03/10/2016 0506   BUN 23 (A) 01/14/2016   CREATININE 0.54 (L) 03/10/2016 0506   CALCIUM 7.9 (L) 03/10/2016 0506   GFRNONAA >60 03/10/2016 0506   GFRAA >60 03/10/2016 0506   CBC    Component Value Date/Time   WBC 7.2 03/10/2016 0506   RBC 2.83 (L) 03/10/2016 0506   HGB 8.2 (L) 03/10/2016 0506   HCT 25.9 (L) 03/10/2016 0506   PLT 313 03/10/2016 0506   MCV 91.5 03/10/2016 0506   MCH 29.0 03/10/2016 0506   MCHC 31.7 03/10/2016 0506   RDW 16.0 (H) 03/10/2016 0506   LYMPHSABS 2.3 03/07/2016 0555   MONOABS 1.3 (H) 03/07/2016 0555   EOSABS 0.1 03/07/2016 0555   BASOSABS 0.0 03/07/2016 0555     Studies/Results: No results found.  Medications: I have reviewed the patient's current medications.  Assessment/Plan:   1. Complications of SBO - debility and encephalopathy.    -CIR    No crushed meds through feeding tube 2. DVT proph - Lovenox 3. Pain Management: Changed PCA to IV morphine every 4 hours prn severe pain  4. Depression - Prozac 5. Neuropsych: This patient is not fully capable of making decisions on his own behalf.  6. Skin/Wound Care: Continue air mattress overlay. Continue large Eakin's pouch to manage wound drainage. WOC for management. Encourage side lying to help manage MASD.  7.Malnutrition. Cont w/TPN              Albumin continues to trend down 8. Abdominal abscess: Leucocytosis resolving. Completed rocephin 1/17 9. Anemia:              Monitor CBC 10. Electrolyte abnormality/Acute renal insufficiency: Managed by TNA              Mag improved  11.Agitation Haldol IM prn for management of agitation/anxiety.  12. Elev glu             Due to TFs             13. Leukocytosis: Stable             WBC 7.2  on 03/10/2016             Afebrile             Ucx NG 14. Orthostasis: Improved 15. Sleep disturbance             Has tried Haldol without benefit, ativan causes hallucinations             Continue Xanax elixer, no meds per tube 16. Mild hyponatremia: Improving             Na+ 137 on 1/25   Length of stay, days: 15  Sonda Primes , MD 03/12/2016, 8:46 PM

## 2016-03-13 ENCOUNTER — Inpatient Hospital Stay (HOSPITAL_COMMUNITY): Payer: Medicare Other | Admitting: *Deleted

## 2016-03-13 LAB — GLUCOSE, CAPILLARY
GLUCOSE-CAPILLARY: 116 mg/dL — AB (ref 65–99)
GLUCOSE-CAPILLARY: 82 mg/dL (ref 65–99)
Glucose-Capillary: 102 mg/dL — ABNORMAL HIGH (ref 65–99)
Glucose-Capillary: 123 mg/dL — ABNORMAL HIGH (ref 65–99)
Glucose-Capillary: 147 mg/dL — ABNORMAL HIGH (ref 65–99)
Glucose-Capillary: 91 mg/dL (ref 65–99)

## 2016-03-13 MED ORDER — FAT EMULSION 20 % IV EMUL
240.0000 mL | INTRAVENOUS | Status: AC
  Administered 2016-03-13: 240 mL via INTRAVENOUS
  Filled 2016-03-13: qty 250

## 2016-03-13 MED ORDER — M.V.I. ADULT IV INJ
INJECTION | INTRAVENOUS | Status: AC
  Administered 2016-03-13: 18:00:00 via INTRAVENOUS
  Filled 2016-03-13: qty 2000

## 2016-03-13 NOTE — Progress Notes (Signed)
[name] is a 76 y.o. male 1940/06/30 [id number]  Subjective: No new complaints. No new problems.   Objective: Vital signs in last 24 hours: Temp:  [97.3 F (36.3 C)-98.3 F (36.8 C)] 97.3 F (36.3 C) (01/28 0440) Pulse Rate:  [79] 79 (01/28 0440) Resp:  [18] 18 (01/28 0440) BP: (112-125)/(42-51) 125/51 (01/28 0440) SpO2:  [96 %] 96 % (01/28 0440) Weight:  [165 lb (74.8 kg)] 165 lb (74.8 kg) (01/28 0440) Weight change: 5 lb (2.268 kg) Last BM Date: 03/12/16  Intake/Output from previous day: 01/27 0701 - 01/28 0700 In: -  Out: 2450 [Urine:1350; Drains:1100] Last cbgs: CBG (last 3)   Recent Labs  03/13/16 0011 03/13/16 0411 03/13/16 0919  GLUCAP 123* 116* 102*     Physical Exam General: No apparent distress  Chronically ill appearing HEENT: tongue is coated Lungs: Normal effort. Lungs clear to auscultation, no crackles or wheezes. Cardiovascular: Regular rate and rhythm, no edema Abdomen: S/NT/ND; BS(+) Musculoskeletal:  unchanged Neurological: No new neurological deficits Wounds w/drains  Skin: clear  Aging changes Mental state: Alert, cooperative    Lab Results: BMET    Component Value Date/Time   NA 137 03/10/2016 0506   NA 139 01/14/2016   K 3.9 03/10/2016 0506   CL 110 03/10/2016 0506   CO2 23 03/10/2016 0506   GLUCOSE 135 (H) 03/10/2016 0506   BUN 27 (H) 03/10/2016 0506   BUN 23 (A) 01/14/2016   CREATININE 0.54 (L) 03/10/2016 0506   CALCIUM 7.9 (L) 03/10/2016 0506   GFRNONAA >60 03/10/2016 0506   GFRAA >60 03/10/2016 0506   CBC    Component Value Date/Time   WBC 7.2 03/10/2016 0506   RBC 2.83 (L) 03/10/2016 0506   HGB 8.2 (L) 03/10/2016 0506   HCT 25.9 (L) 03/10/2016 0506   PLT 313 03/10/2016 0506   MCV 91.5 03/10/2016 0506   MCH 29.0 03/10/2016 0506   MCHC 31.7 03/10/2016 0506   RDW 16.0 (H) 03/10/2016 0506   LYMPHSABS 2.3 03/07/2016 0555   MONOABS 1.3 (H) 03/07/2016 0555   EOSABS 0.1 03/07/2016 0555   BASOSABS 0.0  03/07/2016 0555    Studies/Results: No results found.  Medications: I have reviewed the patient's current medications.  A/P:  1. Small bowel obstruction with complications. Debility and encephalopathy. Continue with inpatient rehabilitation therapies. 2. DVT prophylaxis with Lovenox 3. Pain management. Continue as before 4. Depression. Continue with Prozac 5. Malnutrition. Continue with TPN. 6. Anemia. Monitor CBC. 7. Renal insufficiency. Continue to monitor chemistries. 8. Abdominal abscess. 9. Agitation, periodic. Haldol intramuscular as needed. 10. Elevated glucose. Will monitor 11. Mild hyponatremia - continue to monitor chemistries.   Length of stay, days: 16  Sonda PrimesAlex Plotnikov , MD 03/13/2016, 11:04 AM

## 2016-03-13 NOTE — Plan of Care (Signed)
Problem: RH BLADDER ELIMINATION Goal: RH STG MANAGE BLADDER WITH ASSISTANCE STG Manage Bladder With min Assistance   Outcome: Not Progressing Condom cath at HS, total assist.

## 2016-03-13 NOTE — Progress Notes (Signed)
PHARMACY - TOTAL PARENTERAL NUTRITION CONSULT NOTE   Pharmacy Consult:  TPN Indication:  SBO and new EC fistula 02/13/16  Patient Measurements: Height: 6' 2"  (188 cm) Weight: 165 lb (74.8 kg) IBW/kg (Calculated) : 82.2   Body mass index is 21.18 kg/m.  Assessment:  38 YOM with history of multiple procedures and extended hospitalization for recurrent partial GOO 2/2 pyloric stricture.  Patient was on TPN for several days, then discharged on 12/29/16 with J/G tube in place for TF. He was unable to tolerate TF and was transitioned back to TPN by [facility] on 01/05/17.  Patient was readmitted on 01/19/17 with partial SBO.  Patient failed MBS on 01/25/17 and did not tolerate bolus feeds through G-tube on 01/26/17. On 12/23, he underwent ex-lap with LoA, segmental SBR, and gastrojejunostomy. On 02/11/14, TF were found draining from midline incision with CT abd showing bowel perforation at [place] sit and development of abscesses.   GI: EC fistula at SB jejunal anastomosis >> smaller.  Prealbumin WNL at 24.  Drain O/P 725m.  Bisacodyl PR QOD, erythromycin started 1/23, Prilosec, BM x1.  Nauseous when TF rate advanced.  Failed 11/26 MBS. Endo: no hx DM - CBGs adequately controlled (CBGs 77-101 off TPN, 116-134 on TPN) Insulin requirements in the past 24 hours:  SSI d/c'ed 1/14 Lytes: 1/25 labs - all WNL  Renal: SCr 0.54, CrCL ~80 ml/min - good UOP 0.8 ml/kg/hr Pulm: stable on RA - nicotine patch Cards: orthostatic hypotension improved - VSS Hepatobil: LFTs WNL except alk phos.  TG WNL. Neuro: Prozac, Xanax, lidocaine patch, PRN morphine ID: s/p CTX 1/13 > 1/17 for UTI, UCx negative - afebrile, WBC WNL Best Practices: Lovenox, MC TPN Access: PICC TPN start date: [facility] > [facility] 01/20/16 > [facility] 02/26/15  Home TPN: 2156 kCal and 75gm protein per day (405g CHO = 1376 kCal, 50g lipid = 480 kCal)  Nutritional Goals: 2100 - 2300 kCal and 95 - 110 g protein per day  Current Nutrition:  Jevity 1.2 at 10 ml/hr for  16 hrs (192 kCal, 9 gm protein) Clinimix + lipid twice weekly (daily average of 2177 kCal, 85g protein)   Plan:  - Per Surgery, Jevity 1.2 at 10 ml/hr over 16 hours (1600 to 0800).  Plan to increase to 20 ml/hr today (no orders yet) - Continue Clinimix E 5/15, infuse 2000 mls over 12 hrs:  50 ml/hr x 1 hr, then 190 ml/hr x 10 hrs, then 50 mL/hr x 1 hr.  Continue lipid 20 ml/hr x 12 hrs. - Clinimix and TF at 10 ml/hr will provide 2092 kCal and 109gm protein per day, meeting ~100% of patient's needs. - Daily multivitamin in TPN (liquid multivitamin not available) - Trace elements every other day because med is on critical shortage (next 1/29) - F/U with TF tolerance to advance - Standard TPN labs on Mon and Thurs   Kassy Mcenroe D. DMina Marble PharmD, BCPS Pager:  [phone] 74751/28/2018, 12:25 PM

## 2016-03-13 NOTE — Plan of Care (Signed)
Problem: RH BOWEL ELIMINATION Goal: RH STG MANAGE BOWEL WITH ASSISTANCE STG Manage Bowel with min Assistance.   Outcome: Not Progressing Total assist, incontinent.

## 2016-03-13 NOTE — Plan of Care (Signed)
Problem: RH BOWEL ELIMINATION Goal: RH STG MANAGE BOWEL WITH ASSISTANCE STG Manage Bowel with min Assistance.   Outcome: Not Progressing Incont-max assist at this time  Problem: RH BLADDER ELIMINATION Goal: RH STG MANAGE BLADDER WITH ASSISTANCE STG Manage Bladder With min Assistance   Outcome: Not Progressing Condom cath at this time

## 2016-03-13 NOTE — Progress Notes (Signed)
Physical Therapy Session Note  Patient Details  Name: [name] MRN: [id number] Date of Birth: 12-04-1940  Today's Date: 03/13/2016 PT Individual Time: 0900-0945 PT Individual Time Calculation (min): 45 min    Skilled Therapeutic Interventions/Progress Updates:   Patient in bed agrees to therapy intervention, no pain reported, max A to don pants in bed. Supine to Sit EOb with mod to max A, good sitting balance EOB. Patient complains of dizziness upon sitting with BP at 127/79, ( supine 118/42) , after 3 min dizziness subsided. Training in sit to stand with Supervision from raised bed and RW in front, standing up to 45 seconds with min A , BP drop noted with reading at 111/47 and symptomatic ( dizziness) Attempted standing x 5 with continued dizziness.  Training in w/c propulsion with use of B UE and LE 3 x 50- feet with min A and increased cues for participation.  Patient returned to room, transfer back to bed(per his request -did not feel he can sit up ), all needs within reach.  Therapy Documentation Precautions:  Precautions Precautions: Fall Precaution Comments: NPO, gastrostomy tube, LLQ JP drain, abdominal surgical incision with pouch Restrictions Weight Bearing Restrictions: No   See Function Navigator for Current Functional Status.   Therapy/Group: Individual Therapy  Dorna MaiCzajkowska, Harryette Shuart W 03/13/2016, 12:28 PM

## 2016-03-13 NOTE — Plan of Care (Signed)
Problem: RH SKIN INTEGRITY Goal: RH STG ABLE TO PERFORM INCISION/WOUND CARE W/ASSISTANCE STG Able To Perform Incision/Wound Care With min Assistance.   Outcome: Not Progressing Total assist with wound care.

## 2016-03-14 ENCOUNTER — Inpatient Hospital Stay (HOSPITAL_COMMUNITY): Payer: Medicare Other

## 2016-03-14 ENCOUNTER — Inpatient Hospital Stay (HOSPITAL_COMMUNITY): Payer: Medicare Other | Admitting: Occupational Therapy

## 2016-03-14 ENCOUNTER — Inpatient Hospital Stay (HOSPITAL_COMMUNITY): Payer: Medicare Other | Admitting: Physical Therapy

## 2016-03-14 ENCOUNTER — Inpatient Hospital Stay (HOSPITAL_COMMUNITY): Payer: Medicare Other | Admitting: Speech Pathology

## 2016-03-14 DIAGNOSIS — E8809 Other disorders of plasma-protein metabolism, not elsewhere classified: Secondary | ICD-10-CM

## 2016-03-14 DIAGNOSIS — Z9889 Other specified postprocedural states: Secondary | ICD-10-CM

## 2016-03-14 DIAGNOSIS — E46 Unspecified protein-calorie malnutrition: Secondary | ICD-10-CM

## 2016-03-14 DIAGNOSIS — R1310 Dysphagia, unspecified: Secondary | ICD-10-CM

## 2016-03-14 LAB — DIFFERENTIAL
Basophils Absolute: 0 10*3/uL (ref 0.0–0.1)
Basophils Relative: 0 %
EOS PCT: 2 %
Eosinophils Absolute: 0.1 10*3/uL (ref 0.0–0.7)
LYMPHS PCT: 37 %
Lymphs Abs: 3.3 10*3/uL (ref 0.7–4.0)
Monocytes Absolute: 1.1 10*3/uL — ABNORMAL HIGH (ref 0.1–1.0)
Monocytes Relative: 12 %
NEUTROS PCT: 49 %
Neutro Abs: 4.3 10*3/uL (ref 1.7–7.7)

## 2016-03-14 LAB — GLUCOSE, CAPILLARY
GLUCOSE-CAPILLARY: 120 mg/dL — AB (ref 65–99)
GLUCOSE-CAPILLARY: 101 mg/dL — AB (ref 65–99)
Glucose-Capillary: 121 mg/dL — ABNORMAL HIGH (ref 65–99)
Glucose-Capillary: 89 mg/dL (ref 65–99)
Glucose-Capillary: 133 mg/dL — ABNORMAL HIGH (ref 65–99)

## 2016-03-14 LAB — COMPREHENSIVE METABOLIC PANEL
ALT: 40 U/L (ref 17–63)
AST: 45 U/L — AB (ref 15–41)
Albumin: 1.6 g/dL — ABNORMAL LOW (ref 3.5–5.0)
Alkaline Phosphatase: 203 U/L — ABNORMAL HIGH (ref 38–126)
Anion gap: 4 — ABNORMAL LOW (ref 5–15)
BUN: 30 mg/dL — AB (ref 6–20)
CHLORIDE: 103 mmol/L (ref 101–111)
CO2: 27 mmol/L (ref 22–32)
Calcium: 8.5 mg/dL — ABNORMAL LOW (ref 8.9–10.3)
Creatinine, Ser: 0.62 mg/dL (ref 0.61–1.24)
GFR calc Af Amer: >60 mL/min (ref >60)
Glucose, Bld: 100 mg/dL — ABNORMAL HIGH (ref 65–99)
POTASSIUM: 4.5 mmol/L (ref 3.5–5.1)
Sodium: 134 mmol/L — ABNORMAL LOW (ref 135–145)
Total Bilirubin: 0.2 mg/dL — ABNORMAL LOW (ref 0.3–1.2)
Total Protein: 6.2 g/dL — ABNORMAL LOW (ref 6.5–8.1)

## 2016-03-14 LAB — CBC
HCT: 27.7 % — ABNORMAL LOW (ref 39.0–52.0)
Hemoglobin: 8.8 g/dL — ABNORMAL LOW (ref 13.0–17.0)
MCH: 28.6 pg (ref 26.0–34.0)
MCHC: 31.8 g/dL (ref 30.0–36.0)
MCV: 89.9 fL (ref 78.0–100.0)
PLATELETS: 365 10*3/uL (ref 150–400)
RBC: 3.08 MIL/uL — AB (ref 4.22–5.81)
RDW: 15.6 % — ABNORMAL HIGH (ref 11.5–15.5)
WBC: 9.2 10*3/uL (ref 4.0–10.5)

## 2016-03-14 LAB — PREALBUMIN: Prealbumin: 26.7 mg/dL (ref 18–38)

## 2016-03-14 LAB — MAGNESIUM: Magnesium: 1.7 mg/dL (ref 1.7–2.4)

## 2016-03-14 LAB — PHOSPHORUS: PHOSPHORUS: 4.1 mg/dL (ref 2.5–4.6)

## 2016-03-14 LAB — TRIGLYCERIDES: Triglycerides: 116 mg/dL (ref <150)

## 2016-03-14 MED ORDER — ERYTHROMYCIN ETHYLSUCCINATE 200 MG/5ML PO SUSR
200.0000 mg | Freq: Two times a day (BID) | ORAL | Status: DC
  Administered 2016-03-14 – 2016-03-16 (×4): 200 mg
  Filled 2016-03-14 (×5): qty 5

## 2016-03-14 MED ORDER — SODIUM CHLORIDE 0.9 % IV SOLN
500.0000 mg | Freq: Every day | INTRAVENOUS | Status: AC
  Administered 2016-03-14 – 2016-03-15 (×2): 500 mg via INTRAVENOUS
  Filled 2016-03-14 (×2): qty 10

## 2016-03-14 MED ORDER — TRACE MINERALS CR-CU-MN-SE-ZN 10-1000-500-60 MCG/ML IV SOLN
INTRAVENOUS | Status: AC
  Administered 2016-03-14 (×2): via INTRAVENOUS
  Filled 2016-03-14: qty 2000

## 2016-03-14 MED ORDER — JEVITY 1.2 CAL PO LIQD
1000.0000 mL | ORAL | Status: DC
  Administered 2016-03-14: 1000 mL
  Filled 2016-03-14: qty 1000

## 2016-03-14 MED ORDER — FAT EMULSION 20 % IV EMUL
240.0000 mL | INTRAVENOUS | Status: AC
  Administered 2016-03-14: 240 mL via INTRAVENOUS
  Filled 2016-03-14: qty 250

## 2016-03-14 MED ORDER — JEVITY 1.2 CAL PO LIQD
1000.0000 mL | ORAL | Status: DC
  Filled 2016-03-14: qty 1000

## 2016-03-14 NOTE — Progress Notes (Addendum)
[place] PHYSICAL MEDICINE & REHABILITATION     PROGRESS NOTE  Subjective/Complaints: Pt seen laying in bed.  She states he had a "very nice" weekend.  He slept well overnight.  He denies abd pain this AM.  Weekend notes reviewed, no issues.   ROS: Denies vomiting, diarrhea, shortness of breath or chest pain  Objective: Vital Signs: Blood pressure (!) 125/46, pulse 85, temperature 98.4 F (36.9 C), temperature source Oral, resp. rate 18, height 6\' 2"  (1.88 m), weight 77.1 kg (170 lb), SpO2 97 %. No results found.  Recent Labs  03/14/16 0534  WBC 9.2  HGB 8.8*  HCT 27.7*  PLT 365    Recent Labs  03/14/16 0526  NA 134*  K 4.5  CL 103  GLUCOSE 100*  BUN 30*  CREATININE 0.62  CALCIUM 8.5*   CBG (last 3)   Recent Labs  03/13/16 2017 03/14/16 0123 03/14/16 0403  GLUCAP 147* 120* 121*    Wt Readings from Last 3 Encounters:  03/14/16 77.1 kg (170 lb)  02/26/16 78.6 kg (173 lb 4.5 oz)  01/19/16 79.2 kg (174 lb 8 oz)    Physical Exam:  BP (!) 125/46 (BP Location: Left Arm)   Pulse 85   Temp 98.4 F (36.9 C) (Oral)   Resp 18   Ht 6\' 2"  (1.88 m)   Wt 77.1 kg (170 lb)   SpO2 97%   BMI 21.83 kg/m  Constitutional: NAD. Vital signs reviewed. Frail.  HENT: Normocephalic and atraumatic. +Stoma. Eyes: EOMI. No discharge.  Cardiovascular: RRR. No JVD. Respiratory: CTA bilaterally. Unlabored.  GI: He exhibits no distension. BS+. +Eakins pouch  Musculoskeletal: He exhibits no edema. He exhibits no tenderness.  Neurological: He is alert.  Sitting balance is good Speech with dysarthria, improving  Motor: 4-/5 throughout (unchanged) Skin: Skin is warm and dry. Abdominal wounds with dressing.   Assessment/Plan: 1. Functional deficits secondary to debility and encephalopathy which require 3+ hours per day of interdisciplinary therapy in a comprehensive inpatient rehab setting. Physiatrist is providing close team supervision and 24 hour management of active medical  problems listed below. Physiatrist and rehab team continue to assess barriers to discharge/monitor patient progress toward functional and medical goals.  Function:  Bathing Bathing position Bathing activity did not occur: Refused Position: Wheelchair/chair at sink  Bathing parts Body parts bathed by patient: Right arm, Left arm, Chest, Front perineal area, Right upper leg, Left upper leg Body parts bathed by helper: Right lower leg, Buttocks, Back, Left lower leg  Bathing assist Assist Level: Touching or steadying assistance(Pt > 75%)      Upper Body Dressing/Undressing Upper body dressing Upper body dressing/undressing activity did not occur: Safety/medical concerns What is the patient wearing?: Pull over shirt/dress     Pull over shirt/dress - Perfomed by patient: Thread/unthread right sleeve, Thread/unthread left sleeve, Put head through opening Pull over shirt/dress - Perfomed by helper: Pull shirt over trunk        Upper body assist Assist Level: Touching or steadying assistance(Pt > 75%)      Lower Body Dressing/Undressing Lower body dressing Lower body dressing/undressing activity did not occur: Safety/medical concerns What is the patient wearing?: Pants, Shoes, [employer] Hose     Pants- Performed by patient: Thread/unthread right pants leg Pants- Performed by helper: Thread/unthread right pants leg, Thread/unthread left pants leg, Pull pants up/down, Fasten/unfasten pants   Non-skid slipper socks- Performed by helper: Don/doff left sock, Don/doff right sock   Socks - Performed by helper: Don/doff  right sock, Don/doff left sock   Shoes - Performed by helper: Don/doff right shoe, Don/doff left shoe       TED Hose - Performed by helper: Don/doff right TED hose, Don/doff left TED hose  Lower body assist Assist for lower body dressing:  (Max A)      Toileting Toileting Toileting activity did not occur: No continent bowel/bladder event Toileting steps completed by patient:  Performs perineal hygiene Toileting steps completed by helper: Adjust clothing prior to toileting, Adjust clothing after toileting, Performs perineal hygiene Toileting Assistive Devices: Grab bar or rail  Toileting assist Assist level:  (total assist)   Transfers Chair/bed transfer   Chair/bed transfer method: Stand pivot, Ambulatory Chair/bed transfer assist level: Moderate assist (Pt 50 - 74%/lift or lower) Chair/bed transfer assistive device: Armrests, Patent attorney     Max distance: 100 ft Assist level: Touching or steadying assistance (Pt > 75%)   Wheelchair   Type: Manual Max wheelchair distance: 90 ft Assist Level: Touching or steadying assistance (Pt > 75%)  Cognition Comprehension Comprehension assist level: Follows basic conversation/direction with extra time/assistive device  Expression Expression assist level: Expresses basic 75 - 89% of the time/requires cueing 10 - 24% of the time. Needs helper to occlude trach/needs to repeat words.  Social Interaction Social Interaction assist level: Interacts appropriately 75 - 89% of the time - Needs redirection for appropriate language or to initiate interaction.  Problem Solving Problem solving assist level: Solves basic 50 - 74% of the time/requires cueing 25 - 49% of the time  Memory Memory assist level: Recognizes or recalls 50 - 74% of the time/requires cueing 25 - 49% of the time    Medical Problem List and Plan: 1. Debility and encephalopathy secondary to small bowel obstruction and associated complications   Cont CIR   Surgery evaluated, TFs per Surgery  Cont NPO   No crushed meds through feeding tube 2. DVT Prophylaxis/Anticoagulation: Pharmaceutical: Lovenox  3. Pain Management: Changed PCA to IV morphine every 4 hours prn severe pain   pain in general has been better controlled. Pt aware there will be ebbs and flows 4. Mood: team to provide ego support. LCSW to follow for evaluation and support.    Prozac started 1/18 5. Neuropsych: This patient is not fully capable of making decisions on his own behalf.  6. Skin/Wound Care: Continue air mattress overlay. Continue large Eakin's pouch to manage wound drainage. WOC for management. Encourage side lying to help manage MASD.  7. Malnutrition/Fluids/Electrolytes/Nutrition: continue TPN.   Albumin improving 8. Abdominal abscess: Leucocytosis resolving. Completed rocephin 1/17 9. Anemia of chronic illness:   Hb 8.8 on 1/29  Cont to monitor 10. Electrolyte abnormality/Acute renal insufficiency: Managed by TNA   Mag improved to 1/25 11. Encephalopathy: Changed haldol to IM prn for management of agitation/anxiety.  12. Hyperglycemia  Due to TFs  Slightly labile, overall controlled 1/29 13. Leukocytosis: Stable  WBC 9.2 on 03/14/2016  Afebrile  Ucx NG 14. Orthostasis: Improved 15. Sleep disturbance  Has tried Haldol without benefit, ativan causes hallucinations  Continue Xanax elixer, no meds per tube  Improved 16. Mild hyponatremia:   Na+ 134 on 1/29  LOS (Days) 17 A FACE TO FACE EVALUATION WAS PERFORMED  Arney Mayabb Karis Juba 03/14/2016 9:29 AM

## 2016-03-14 NOTE — Plan of Care (Signed)
Problem: RH SKIN INTEGRITY Goal: RH STG ABLE TO PERFORM INCISION/WOUND CARE W/ASSISTANCE STG Able To Perform Incision/Wound Care With min Assistance.   Outcome: Not Progressing Total assistance with dressing changes.

## 2016-03-14 NOTE — Progress Notes (Signed)
Physical Therapy Session Note  Patient Details  Name: KYZER BLOWE MRN: [id number] Date of Birth: [birth year]  Today's Date: 03/14/2016 PT Individual Time: 8757-9728 PT Individual Time Calculation (min): 30 min   Short Term Goals: Week 3:  PT Short Term Goal 1 (Week 3): Pt will ambulate 100 ft supervision with RW  PT Short Term Goal 2 (Week 3): Pt will demonstrate sit-to-stand and stand pivot transfers Supervision with RW consistently PT Short Term Goal 3 (Week 3): Pt will ascend and descend 3 standard steps with bilateral rails min A PT Short Term Goal 4 (Week 3): Pt will demonstrate ability to travel 100 ft supervision in W/C with minimal need for rest breaks before continung with therapy. PT Short Term Goal 5 (Week 3): Pt will demonstrate ability to perform BUE supported standing tolerance for 3 min consistently  Skilled Therapeutic Interventions/Progress Updates:    no c/o pain, but fatigued.  Session focus on BLE PROM stretching to heel cords, distal and proximal hamstrings, glutes, piriformis, sartorius, and hip IR/ER.  Pt perform supine<>sit with mod assist overall and cues for focusing on target to reduce dizziness during transitions.  Noted strong L beat nystagmus when rolling from R side lying to back.  Pt positioned to comfort in chair position in bed with call bell in reach and needs met at end of session.    Therapy Documentation Precautions:  Precautions Precautions: Fall Precaution Comments: NPO, gastrostomy tube, LLQ JP drain, abdominal surgical incision with pouch Restrictions Weight Bearing Restrictions: No   See Function Navigator for Current Functional Status.   Therapy/Group: Individual Therapy  Earnest Conroy Penven-Crew 03/14/2016, 4:38 PM

## 2016-03-14 NOTE — Progress Notes (Signed)
Coughing up thick white sputum, using [place]. Complained of feeling nauseated, PRN IV compazine given at 2243. No further complaints of nausea during night. Refused to turn during night. Prevalon boots applied. Incontinent of stool.

## 2016-03-14 NOTE — Progress Notes (Signed)
Physical Therapy Session Note  Patient Details  Name: [name] MRN: [id number] Date of Birth: [birth year]  Today's Date: 03/14/2016 PT Individual Time: [id number] PT Individual Time Calculation (min): 30 min   Short Term Goals: Week 3:  PT Short Term Goal 1 (Week 3): Pt will ambulate 100 ft supervision with RW  PT Short Term Goal 2 (Week 3): Pt will demonstrate sit-to-stand and stand pivot transfers Supervision with RW consistently PT Short Term Goal 3 (Week 3): Pt will ascend and descend 3 standard steps with bilateral rails min A PT Short Term Goal 4 (Week 3): Pt will demonstrate ability to travel 100 ft supervision in W/C with minimal need for rest breaks before continung with therapy. PT Short Term Goal 5 (Week 3): Pt will demonstrate ability to perform BUE supported standing tolerance for 3 min consistently  Skilled Therapeutic Interventions/Progress Updates:   Session focused on assessing reports of dizziness with mobility which appears have vestibular nature and functional transfer OOB to recliner. Pt with fast R beating nystagmus with rolling to the L and minimal L beating nystagmus with rolling to the R. Pt symptomatic with both but significantly more with rolling L. Pt requires about 30 seconds once returned to supine to report relief from symptoms. During bed mobility to come to EOB with mod assist to the L, verbal cues for maintaining eyes fixed on a target which appeared to help with transitional movements. Pt required min assist to transfer to recliner using RW with verbal cues for sequencing and min assist for upright posture and balance. Set up with all needs in reach and quick release belt on. Encouraged pt to sit up OOB for 30 min - 1 hour to increase overall endurance. Pt verbalized understanding.   Therapy Documentation Precautions:  Precautions Precautions: Fall Precaution Comments: NPO, gastrostomy tube, LLQ JP drain, abdominal surgical incision with  pouch Restrictions Weight Bearing Restrictions: No Pain: Denies pain. C/o dizziness increasing this weekend/today.   See Function Navigator for Current Functional Status.   Therapy/Group: Individual Therapy  Karolee StampsGray, Mariza Bourget Darrol PokeBrescia  Rosaleen Mazer B. Karem Farha, PT, DPT  03/14/2016, 1:44 PM

## 2016-03-14 NOTE — Progress Notes (Signed)
[facility] Progress Note     Subjective: No new complaints. Abdominal pain is unchanged and minimal.  Objective: Vital signs in last 24 hours: Temp:  [98.4 F (36.9 C)-98.5 F (36.9 C)] 98.4 F (36.9 C) (01/29 0424) Pulse Rate:  [74-85] 85 (01/29 0424) Resp:  [18-19] 18 (01/29 0424) BP: (125-129)/(46-51) 125/46 (01/29 0424) SpO2:  [97 %] 97 % (01/29 0424) Weight:  [170 lb (77.1 kg)] 170 lb (77.1 kg) (01/29 0424) Last BM Date: 03/13/16  Intake/Output from previous day: 01/28 0701 - 01/29 0700 In: 30 [I.V.:30] Out: 2700 [Urine:1850; Drains:850] Intake/Output this shift: Total I/O In: 30 [I.V.:30] Out: -   PE: Gen:  Alert, NAD, pleasant, cooperative, lying in bed Card:  RRR, no M/G/R heard Pulm:  Rate and effort normal Abd: Soft, nondistended, +BS, EC fistula decreasing in size as is the eakens pouch. No TTP. G tube site clean, dressed and intact Skin: no rashes noted, warm and dry, not diaphoretic  Lab Results:   Recent Labs  03/14/16 0534  WBC 9.2  HGB 8.8*  HCT 27.7*  PLT 365   BMET  Recent Labs  03/14/16 0526  NA 134*  K 4.5  CL 103  CO2 27  GLUCOSE 100*  BUN 30*  CREATININE 0.62  CALCIUM 8.5*   PT/INR No results for input(s): LABPROT, INR in the last 72 hours. CMP     Component Value Date/Time   NA 134 (L) 03/14/2016 0526   NA 139 01/14/2016   K 4.5 03/14/2016 0526   CL 103 03/14/2016 0526   CO2 27 03/14/2016 0526   GLUCOSE 100 (H) 03/14/2016 0526   BUN 30 (H) 03/14/2016 0526   BUN 23 (A) 01/14/2016   CREATININE 0.62 03/14/2016 0526   CALCIUM 8.5 (L) 03/14/2016 0526   PROT 6.2 (L) 03/14/2016 0526   ALBUMIN 1.6 (L) 03/14/2016 0526   AST 45 (H) 03/14/2016 0526   ALT 40 03/14/2016 0526   ALKPHOS 203 (H) 03/14/2016 0526   BILITOT 0.2 (L) 03/14/2016 0526   GFRNONAA >60 03/14/2016 0526   GFRAA >60 03/14/2016 0526   Lipase     Component Value Date/Time   LIPASE 55 (H) 01/20/2016 0158       Studies/Results: No  results found.  Anti-infectives: Anti-infectives    Start     Dose/Rate Route Frequency Ordered Stop   03/14/16 2000  erythromycin ethylsuccinate (EES) 200 MG/5ML suspension 200 mg    Comments:  Give via G tube   200 mg Per Tube Every 12 hours 03/14/16 0805     03/08/16 1730  erythromycin ethylsuccinate (EES) 200 MG/5ML suspension 200 mg  Status:  Discontinued     200 mg Per Tube Every 8 hours 03/08/16 1634 03/14/16 0805   02/29/16 2000  cefTRIAXone (ROCEPHIN) 2 g in dextrose 5 % 50 mL IVPB     2 g 100 mL/hr over 30 Minutes Intravenous Every 24 hours 02/29/16 1804 03/02/16 2017   02/27/16 2000  cefTRIAXone (ROCEPHIN) 2 g in dextrose 5 % 50 mL IVPB  Status:  Discontinued     2 g 100 mL/hr over 30 Minutes Intravenous Every 24 hours 02/26/16 2137 02/29/16 1804       Assessment/Plan  EC fistula - located at SB jejunal anastomosis - clinically improving, smaller wound/pouch and lower output - continues to tolerate continuous TF's at210mL/hr; continue erythromycin  - perc drain removed 1/25 - continue only liquid medications via J tube and no crushed meds - continue G-tube to  gravity  FEN - NPO, IVF, TPN, TF at night VTE - Lovenox   Plan - Repeat swallow study recommending continuing ice chips, per Dr. Michaell Cowing. Maintain G-tube to gravity. Will increase TF up to 58mL/hr and see if he tolerates. Last time he did not tolerate the increase. Will increase today and if he tolerates will increase to 27mL/hr on Wednesday.     LOS: 17 days    Jerre Simon , [facility] 03/14/2016, 9:02 AM Pager: [phone] Consults: [phone] Mon-Fri 7:00 am-4:30 pm Sat-Sun 7:00 am-11:30 am

## 2016-03-14 NOTE — Plan of Care (Signed)
Problem: RH BLADDER ELIMINATION Goal: RH STG MANAGE BLADDER WITH ASSISTANCE STG Manage Bladder With min Assistance   Outcome: Not Progressing Incontinent, condom cath at HS-total assistance.

## 2016-03-14 NOTE — Progress Notes (Signed)
PHARMACY - TOTAL PARENTERAL NUTRITION CONSULT NOTE   Pharmacy Consult:  TPN Indication:  SBO and new EC fistula 02/13/16  Patient Measurements: Height: 6' 2"  (188 cm) Weight: 170 lb (77.1 kg) IBW/kg (Calculated) : 82.2   Body mass index is 21.83 kg/m.  Assessment:  31 YOM with history of multiple procedures and extended hospitalization for recurrent partial GOO 2/2 pyloric stricture.  Patient was on TPN for several days, then discharged on 12/29/16 with J/G tube in place for TF. He was unable to tolerate TF and was transitioned back to TPN by [facility] on 01/05/17.  Patient was readmitted on 01/19/17 with partial SBO.  Patient failed MBS on 01/26/16 and did not tolerate bolus feeds through G-tube on 01/27/16. On 12/23, he underwent ex-lap with LoA, segmental SBR, and gastrojejunostomy. On 02/12/16, TF were found draining from midline incision with CT abd showing bowel perforation at [place] site and development of abscesses.   GI: EC fistula at SB jejunal anastomosis >> smaller. Prealbumin WNL at 24>26.7. Drain O/P 831m. Erythromycin started 1/23, Prilosec, having BM. Nauseous when TF rate advanced. Compazine PRN. Failed 11/26 MBS. TF not advanced over weekend (RN did not see order) Endo: no hx DM - CBGs adequately controlled (CBGs 82-91 off TPN, 100-147 on TPN) Insulin requirements in the past 24 hours:  SSI d/c'd 1/14 Lytes: Na 134, coCa~10.4  Renal: SCr 0.62 stable, CrCL ~85 ml/min - good UOP 0.5 ml/kg/hr Pulm: stable on RA - nicotine patch Cards: orthostatic hypotension improved - VSS Hepatobil: AST 45, ALT wnl, alk phos up 203. TG WNL. Neuro: Prozac, Xanax, lidocaine patch, PRN morphine Heme/Onc: Iron dextran x 1 on 1/29 ID: s/p CTX 1/13 > 1/17 for UTI, UCx negative - afebrile, WBC WNL Best Practices: Lovenox, [facility] TPN Access: PICC TPN start date: PTA > [facility] 01/20/16 > [facility] 02/26/15  Home TPN: 2156 kCal and 75gm protein per day (405g CHO = 1376 kCal, 50g lipid = 480 kCal)  Nutritional  Goals: 2100 - 2300 kCal and 95 - 110 g protein per day  Current Nutrition:  Jevity 1.2 at 10 >> 15 ml/hr for 16 hrs (288 kCal, 13 gm protein) Clinimix + lipids (1900 kCal, 100g protein)   Plan:  - Per Surgery, Jevity 1.2 increased to 15 ml/hr over 16 hours (1400 to 0600). If tolerates, increase to 20 ml/hr on Wed - Cycle Clinimix E 5/15, infuse 2000 mls over 12 hrs:  50 ml/hr x 1 hr, then 190 ml/hr x 10 hrs, then 50 mL/hr x 1 hr. Continue lipids 20 ml/hr x 12 hrs - Clinimix and TF at 15 ml/hr will provide 2188 kCal and 113gm protein per day, meeting ~100% of patient's needs. - Daily multivitamin in TPN (liquid multivitamin not available) - Trace elements every other day due to med on critical shortage (next 1/29) - F/U with TF tolerance to advance - Standard TPN labs on Mon and Thurs   HElicia Lamp PharmD, BCPS Clinical Pharmacist 03/14/2016 8:29 AM

## 2016-03-14 NOTE — Progress Notes (Signed)
Speech Language Pathology Daily Session Note  Patient Details  Name: [name] MRN: [id number] Date of Birth: 1940-12-28  Today's Date: 03/14/2016 SLP Individual Time: 1030-1130 SLP Individual Time Calculation (min): 60 min  Short Term Goals: Week 2: SLP Short Term Goal 1 (Week 2): Pt will utilize speech intelligibility strategies at the sentence level with Min A verbal cues to achieve intelligibility.  SLP Short Term Goal 2 (Week 2): Pt will complete basic, familiar tasks iwth Min A verbal cues for functional problem solving.  SLP Short Term Goal 3 (Week 2): Pt will sustain attention to functional tasks for ~10 minutes with Mod A verbal cues.  SLP Short Term Goal 4 (Week 2): Pt will utilize external memory aids to recall new, daily information with Mod A verbal cues.  SLP Short Term Goal 5 (Week 2): Pt will consistently demonstrate O x 4 with supervision verbal cues.  SLP Short Term Goal 6 (Week 2): Pt will consume therapeutic trials of ice chips with minimal overt s/s of aspiration over 2 consecutive sessions to demonstrate readiness for repeat MBS.   Skilled Therapeutic Interventions: Skilled treatment session focused on cognition goals. SLP facilitated session by providing Mod A verbal cues to sustain attention to functional tasks (connect four) for ~15 minutes. Pt refused any trials of ice chips this session but oral care provided. Pt required Mod A verbal cues for completing semi-complex tasks. Pt left in bed with all needs within reach. Continue per current plan of care.      Function:    Cognition Comprehension Comprehension assist level: Follows basic conversation/direction with extra time/assistive device  Expression   Expression assist level: Expresses basic 75 - 89% of the time/requires cueing 10 - 24% of the time. Needs helper to occlude trach/needs to repeat words.  Social Interaction Social Interaction assist level: Interacts appropriately 75 - 89% of the time - Needs  redirection for appropriate language or to initiate interaction.  Problem Solving Problem solving assist level: Solves basic 50 - 74% of the time/requires cueing 25 - 49% of the time  Memory Memory assist level: Recognizes or recalls 50 - 74% of the time/requires cueing 25 - 49% of the time    Pain Pain Assessment Pain Assessment: Faces Faces Pain Scale: Hurts a little bit Pain Type: Acute pain Pain Location: Abdomen Pain Orientation: Mid Pain Descriptors / Indicators: Aching Pain Intervention(s): Rest  Therapy/Group: Individual Therapy  [name], M.S., CCC-SLP Speech-Language Pathologist  [name] 03/14/2016, 12:08 PM

## 2016-03-14 NOTE — Progress Notes (Signed)
Occupational Therapy Session Note  Patient Details  Name: [name] MRN: [id number] Date of Birth: [birth year]  Today's Date: 03/14/2016 OT Individual Time: 8916-9450 OT Individual Time Calculation (min): 73 min    Short Term Goals: Week 2:  OT Short Term Goal 1 (Week 2): Pt will complete bathing with mod assist at sit > stand level OT Short Term Goal 1 - Progress (Week 2): Met OT Short Term Goal 2 (Week 2): Pt will tolerate 5 minutes of standing activity in preparation for ADL tasks OT Short Term Goal 2 - Progress (Week 2): Partly met OT Short Term Goal 3 (Week 2): Pt will complete LB dressing with Mod A OT Short Term Goal 3 - Progress (Week 2): Progressing toward goal Week 3:  OT Short Term Goal 1 (Week 3): Pt will tolerate 5 minutes of standing in preparation for ADL tasks OT Short Term Goal 2 (Week 3): Pt will complete LB dressing with Mod A and ADL AE OT Short Term Goal 3 (Week 3): Pt will recall energy conservation techniques with min verbal cues during ADL task     Skilled Therapeutic Interventions/Progress Updates:    Pt seen this session to facilitate functional mobility of sit to stand, transfers, standing balance and tolerance during ADL retraining. Pt received in bed and agreeable to B/D from w/c. Pt continues to use a condom cath during the day. Pt states he can sense when he needs to urinate. Discussed with pt and RN trialing a day without condom cath with pt working on initiation to call for assist to use urinal.  Pt agreed to try. Pt used RW to stand and stand pivot to w/c with min A.  Pt taken to sink and he worked on oral care, grooming, and UB bathing and dressing with set up/supervision. Pt stood to sink to doff briefs and pants. His brief was soiled with stool. Pt complained of dizziness. Blood pressue slightly low. After bathing, donned thigh high TED hose before pants. Declined use of abdominal binder.  Pt able to tolerate standing 2 more times for brief periods.   Pt needed numerous breaks and extended time to complete tasks. Pt insisted on getting back to bed. Used RW to bed with min A.   During each sit to stand pt needs cues of "1, 2,3" as when to stand and cues to push up from w/c/sitting surface and cues to reach back. Pt adjusted in bed into supine. Noted nystagmus to the R in B eyes. Adjusted HOB elevated slightly. After a few minutes dizziness passed. Will need to f/u with some vestibular exercises. Pt in bed with all needs met.     Therapy Documentation Precautions:  Precautions Precautions: Fall Precaution Comments: NPO, gastrostomy tube, LLQ JP drain, abdominal surgical incision with pouch Restrictions Weight Bearing Restrictions: No  Therapy Vitals BP: (!) 130/53 Pain: Pain Assessment Pain Assessment: Faces Faces Pain Scale: Hurts a little bit Pain Type: Acute pain Pain Location: Abdomen Pain Orientation: Mid Pain Descriptors / Indicators: Aching Pain Intervention(s): Rest ADL:   See Function Navigator for Current Functional Status.   Therapy/Group: Individual Therapy  Ronnell Makarewicz 03/14/2016, 10:25 AM

## 2016-03-14 NOTE — Progress Notes (Signed)
Refused AM supp.[name], [name]

## 2016-03-14 NOTE — Plan of Care (Signed)
Problem: RH BOWEL ELIMINATION Goal: RH STG MANAGE BOWEL WITH ASSISTANCE STG Manage Bowel with min Assistance.   Outcome: Not Progressing Incontinent Total assist.

## 2016-03-15 ENCOUNTER — Inpatient Hospital Stay (HOSPITAL_COMMUNITY): Payer: Medicare Other | Admitting: Occupational Therapy

## 2016-03-15 ENCOUNTER — Inpatient Hospital Stay (HOSPITAL_COMMUNITY): Payer: Medicare Other | Admitting: Physical Therapy

## 2016-03-15 ENCOUNTER — Inpatient Hospital Stay (HOSPITAL_COMMUNITY): Payer: Medicare Other

## 2016-03-15 ENCOUNTER — Inpatient Hospital Stay (HOSPITAL_COMMUNITY): Payer: Medicare Other | Admitting: Speech Pathology

## 2016-03-15 DIAGNOSIS — K2900 Acute gastritis without bleeding: Secondary | ICD-10-CM

## 2016-03-15 DIAGNOSIS — K3189 Other diseases of stomach and duodenum: Secondary | ICD-10-CM

## 2016-03-15 LAB — GLUCOSE, CAPILLARY
GLUCOSE-CAPILLARY: 103 mg/dL — AB (ref 65–99)
GLUCOSE-CAPILLARY: 78 mg/dL (ref 65–99)
Glucose-Capillary: 103 mg/dL — ABNORMAL HIGH (ref 65–99)
Glucose-Capillary: 122 mg/dL — ABNORMAL HIGH (ref 65–99)
Glucose-Capillary: 125 mg/dL — ABNORMAL HIGH (ref 65–99)
Glucose-Capillary: 129 mg/dL — ABNORMAL HIGH (ref 65–99)
Glucose-Capillary: 96 mg/dL (ref 65–99)

## 2016-03-15 MED ORDER — SODIUM CHLORIDE 0.9 % IV SOLN
Freq: Every day | INTRAVENOUS | Status: DC
  Administered 2016-03-15: 1000 mL via INTRAVENOUS
  Administered 2016-03-16 – 2016-03-21 (×6): via INTRAVENOUS
  Administered 2016-03-22: 950 mL via INTRAVENOUS
  Administered 2016-03-23 – 2016-03-28 (×6): via INTRAVENOUS

## 2016-03-15 MED ORDER — JEVITY 1.2 CAL PO LIQD
1000.0000 mL | ORAL | Status: DC
  Administered 2016-03-15: 1000 mL
  Administered 2016-03-16: 20 mL/h
  Filled 2016-03-15 (×3): qty 1000

## 2016-03-15 MED ORDER — M.V.I. ADULT IV INJ
INTRAVENOUS | Status: AC
  Administered 2016-03-15: 18:00:00 via INTRAVENOUS
  Filled 2016-03-15: qty 1800

## 2016-03-15 MED ORDER — ACETAMINOPHEN 160 MG/5ML PO SOLN
1000.0000 mg | Freq: Three times a day (TID) | ORAL | Status: DC
  Administered 2016-03-15 – 2016-03-21 (×16): 1000 mg via JEJUNOSTOMY
  Filled 2016-03-15 (×17): qty 40.6

## 2016-03-15 MED ORDER — FAT EMULSION 20 % IV EMUL
240.0000 mL | INTRAVENOUS | Status: AC
  Administered 2016-03-15: 240 mL via INTRAVENOUS
  Filled 2016-03-15: qty 250

## 2016-03-15 NOTE — Progress Notes (Signed)
Speech Language Pathology Weekly Progress and Session Note  Patient Details  Name: [name] MRN: [id number] Date of Birth: 10/02/1940  Beginning of progress report period: March 15, 2016 End of progress report period: March 18, 2016  Today's Date: 03/15/2016 SLP Individual Time: 0930-1000 SLP Individual Time Calculation (min): 30 min  Short Term Goals: Week 2: SLP Short Term Goal 1 (Week 2): Pt will utilize speech intelligibility strategies at the sentence level with Min A verbal cues to achieve intelligibility.  SLP Short Term Goal 1 - Progress (Week 2): Not met SLP Short Term Goal 2 (Week 2): Pt will complete basic, familiar tasks iwth Min A verbal cues for functional problem solving.  SLP Short Term Goal 2 - Progress (Week 2): Met SLP Short Term Goal 3 (Week 2): Pt will sustain attention to functional tasks for ~10 minutes with Mod A verbal cues.  SLP Short Term Goal 3 - Progress (Week 2): Met SLP Short Term Goal 4 (Week 2): Pt will utilize external memory aids to recall new, daily information with Mod A verbal cues.  SLP Short Term Goal 4 - Progress (Week 2): Met SLP Short Term Goal 5 (Week 2): Pt will consistently demonstrate O x 4 with supervision verbal cues.  SLP Short Term Goal 5 - Progress (Week 2): Partly met SLP Short Term Goal 6 (Week 2): Pt will consume therapeutic trials of ice chips with minimal overt s/s of aspiration over 2 consecutive sessions to demonstrate readiness for repeat MBS.  SLP Short Term Goal 6 - Progress (Week 2): Not met    New Short Term Goals: Week 3: SLP Short Term Goal 1 (Week 3): Pt will utilize speech intelligibility strategies at the sentence level with Min A verbal cues to achieve  ~50% intelligibility.  SLP Short Term Goal 2 (Week 3): Pt will complete semi-complex tasks with Mod A verbal cues. SLP Short Term Goal 3 (Week 3): Pt will sustain attention to functional tasks for ~ 30 minutes with Min A verbal cues.  SLP Short Term Goal 4  (Week 3): Pt will utilize external memory aids to recall new, daily information with Min A verbal cues.  SLP Short Term Goal 5 (Week 3): Pt will consistently demonstrate O x 4 with supervision verbal cues.  SLP Short Term Goal 6 (Week 3): Pt will consume therapeutic trials of ice chips with minimal overt s/s of aspiration over 2 consecutive sessions to demonstrate readiness for repeat MBS.   Weekly Progress Updates: Pt has made slow steady progress during this reporting period as evidenced by meeting 3 of 6 STG's. Pt has made functional gains in the area of sustained attention and basic problem solving. Pt continues to decline trials of ice chips to assist with improved swallow function. Max education and encouragement given to target swallow function. Pt also requires Mod A verbal cues to utilize speech intelligibility strategies at the sentence level to achieve ~25% intelligibility. Pt continues to require skilled ST services to target areas of speech intelligibility, dysphagia, semi-complex problem solving and memory to increase functional independence prior to discharge.    Intensity: Minumum of 1-2 x/day, 30 to 90 minutes Frequency: 3 to 5 out of 7 days Duration/Length of Stay: 14 to 18 days Treatment/Interventions: Cognitive remediation/compensation;Cueing hierarchy;Speech/Language facilitation;Multimodal communication approach;Patient/family education;Therapeutic Activities;Dysphagia/aspiration precaution training   Daily Session  Skilled Therapeutic Interventions: Skilled treatment session focused on cognition goals. SLP facilitated session by providing Min A verbal cues for recall of orientation information and recall of  dysphagia goals. Dispute Max A encouragement for consumption of ice chips and purpose, pt refused trials. Review of goals and plan of care shared with pt including remaining areas of deficits. Pt able to recall areas of deficits and recall strategies to improve speech  intelligibility with Min A verbal cues. Pt was intelligible at the simple sentence level for ~25 with Mod A verbal cues for use of strategies. Pt was left upright in wheelchair with safety belt donned and all needs within reach. Continue per current plan of care.     Function:     Cognition Comprehension Comprehension assist level: Follows basic conversation/direction with extra time/assistive device  Expression   Expression assist level: Expresses basic 75 - 89% of the time/requires cueing 10 - 24% of the time. Needs helper to occlude trach/needs to repeat words.  Social Interaction Social Interaction assist level: Interacts appropriately 75 - 89% of the time - Needs redirection for appropriate language or to initiate interaction.  Problem Solving Problem solving assist level: Solves basic 50 - 74% of the time/requires cueing 25 - 49% of the time  Memory Memory assist level: Recognizes or recalls 50 - 74% of the time/requires cueing 25 - 49% of the time   General    Pain    Therapy/Group: Individual Therapy  [name] 03/15/2016, 10:00 AM

## 2016-03-15 NOTE — Progress Notes (Signed)
Occupational Therapy Session Note  Patient Details  Name: [name] MRN: [id number] Date of Birth: 1940/12/24  Today's Date: 03/15/2016 OT Individual Time: 1000-1100 OT Individual Time Calculation (min): 60 min   Short Term Goals: Week 3:  OT Short Term Goal 1 (Week 3): Pt will tolerate 5 minutes of standing in preparation for ADL tasks OT Short Term Goal 2 (Week 3): Pt will complete LB dressing with Mod A and ADL AE OT Short Term Goal 3 (Week 3): Pt will recall energy conservation techniques with min verbal cues during ADL task  Skilled Therapeutic Interventions/Progress Updates:    Pt greeted in w/c upon OT arrival.  OT treatment session focused on increased activity tolerance, modified bathing/dressing, and standing endurance. Grooming completed at the sink with overall set-up A and cues to initiate activity. Reviewed LB bathing/dressing technique of crossing ankle over knee to reach feet. Pt able to doff socks and wash his feet using this technique with overall Min A. Rest breaks required in between bathing/dressing tasks 2/2 fatigue. Verbal cues for technique with sit<>stand at sink with overall close supervision and min guard A when reaching to pull pants over hips. Mod A to don socks 2/2 fatigue. Pt demonstrated overall improved initiation today and increased activity tolerance during bathing/dressing tasks. Pt tolerated ~4 minutes standing today. Pt declined staying OOB, despite encouragement, and returned to bed at end of session via stand-pivot w/ min A; needs met and be alarm on.   Therapy Documentation Precautions:  Precautions Precautions: Fall Precaution Comments: NPO, gastrostomy tube, LLQ JP drain, abdominal surgical incision with pouch Restrictions Weight Bearing Restrictions: No Pain: Pain Assessment Pain Assessment: No/denies pain  See Function Navigator for Current Functional Status.   Therapy/Group: Individual Therapy  Valma Cava 03/15/2016, 10:22 AM

## 2016-03-15 NOTE — Progress Notes (Signed)
PHARMACY - TOTAL PARENTERAL NUTRITION CONSULT NOTE   Pharmacy Consult:  TPN Indication:  SBO and new EC fistula 02/13/16  Patient Measurements: Height: 6' 2"  (188 cm) Weight: 170 lb (77.1 kg) IBW/kg (Calculated) : 82.2   Body mass index is 21.83 kg/m.  Assessment:  49 YOM with history of multiple procedures and extended hospitalization for recurrent partial GOO 2/2 pyloric stricture.  Patient was on TPN for several days, then discharged on 12/29/16 with J/G tube in place for TF. He was unable to tolerate TF and was transitioned back to TPN by [facility] on 01/05/17.  Patient was readmitted on 01/19/17 with partial SBO.  Patient failed MBS on 01/26/16 and did not tolerate bolus feeds through G-tube on 01/27/16. On 12/23, he underwent ex-lap with LoA, segmental SBR, and gastrojejunostomy. On 02/12/16, TF were found draining from midline incision with CT abd showing bowel perforation at [place] site and development of abscesses.   GI: EC fistula at SB jejunal anastomosis >> smaller. Prealbumin WNL at 24>26.7. Drain O/P 480m. Erythromycin started 1/23, Prilosec, having BM. Nauseous when TF rate advanced previously. Compazine PRN. Failed 11/26 MBS. TF not advanced over weekend (RN did not see order) Endo: no hx DM - CBGs adequately controlled (CBGs 89-101 off TPN, 100-133 on TPN) Insulin requirements in the past 24 hours:  SSI d/c'd 1/14 Lytes: Na 134, coCa~10.4  Renal: SCr 0.62 stable, CrCL ~85 ml/min - good UOP 0.5 ml/kg/hr Pulm: stable on RA - nicotine patch Cards: orthostatic hypotension improved - VSS Hepatobil: AST 45, ALT wnl, alk phos up 203. TG WNL. Neuro: Prozac, Xanax, lidocaine patch, PRN morphine Heme/Onc: Iron dextran ID: s/p CTX 1/13 > 1/17 for UTI, UCx negative - afebrile, WBC WNL Best Practices: Lovenox, [facility] TPN Access: PICC TPN start date: PTA > [facility] 01/20/16 > [facility] 02/26/15  Home TPN: 2156 kCal and 75gm protein per day (405g CHO = 1376 kCal, 50g lipid = 480 kCal)  Nutritional  Goals: 2100 - 2300 kCal and 95 - 110 g protein per day  Current Nutrition:  Jevity 1.2 at 20 ml/hr for 16 hrs (384 kCal, 18 gm protein) Clinimix + lipids (1900 kCal, 100g protein)   Plan:  - Per Surgery, continue Jevity 1.2 at 20 ml/hr over 16 hours (1400 to 0600). Per RN, tolerated overnight - to resume at this rate today - Cycle Clinimix E 5/15, infuse 1800 mls over 12 hrs:  50 ml/hr x 1 hr, then 170 ml/hr x 10 hrs, then 50 mL/hr x 1 hr. Continue lipids 20 ml/hr x 12 hrs - Clinimix and TF at 15 ml/hr will provide 2142 kCal and 108 gm protein per day, meeting ~100% of patient's needs. - Daily multivitamin in TPN (liquid multivitamin not available) - Trace elements every other day due to med on critical shortage (next 1/31) - F/U with TF tolerance to advance, ability to wean TPN - Standard TPN labs on Mon and Thurs   HElicia Lamp PharmD, BCPS Clinical Pharmacist 03/15/2016 8:23 AM

## 2016-03-15 NOTE — Progress Notes (Signed)
Physical Therapy Session Note  Patient Details  Name: [name] MRN: [id number] Date of Birth: 11/23/1940  Today's Date: 03/15/2016 PT Individual Time: 1300-1400 PT Individual Time Calculation (min): 60 min   Short Term Goals: Week 3:  PT Short Term Goal 1 (Week 3): Pt will ambulate 100 ft supervision with RW  PT Short Term Goal 2 (Week 3): Pt will demonstrate sit-to-stand and stand pivot transfers Supervision with RW consistently PT Short Term Goal 3 (Week 3): Pt will ascend and descend 3 standard steps with bilateral rails min A PT Short Term Goal 4 (Week 3): Pt will demonstrate ability to travel 100 ft supervision in W/C with minimal need for rest breaks before continung with therapy. PT Short Term Goal 5 (Week 3): Pt will demonstrate ability to perform BUE supported standing tolerance for 3 min consistently  Skilled Therapeutic Interventions/Progress Updates:    Patient seen for vestibular assessment with wife present.  In room performed supine to sit and sat EOB for initial assessment.  Ambulated partway to gym with min A and wife followed with w/c.  In gym on edge of mat performed sidelying test for BPPV.  Noted in R sidelying (with limited cervical extension and rotation due to limited ROM) pt with mild L beat nystagmus and no c/o symptoms.  In L sidelying test noted significant R beat nystagmus (ageotropic) and initiated treatment via technique outlined by Rivka BarbaraZuma e Maia, F.  "New Treatment Strategy for Apogeotropic Horizontal Canal Benign Paroxysmal Positional Vertigo."  Audiol Res. 2016 Aug 23; 6(2):163.  However, pt with nausea and vomiting on second position of maneuver so aborted and assisted to back upright.  Planned to attempt at later date after premedicated for nausea.  See below for Vestibular eval details.    03/15/16 0001  Vestibular Assessment  General Observation Generally slow and stiff with movement; reports dizziness per wife noted when entering rehab and pt moving  more; describes as woozy headedness, but not like passing out.  Denies previous nausea.  No hearing changes.  Symptom Behavior  Type of Dizziness "Funny feeling in head" (and imbalance; wooziness)  Frequency of Dizziness intermittent  Duration of Dizziness minutes  Aggravating Factors Supine to sit;Moving eyes;Activity in general;Rolling to left  Relieving Factors Rest  Occulomotor Exam  Occulomotor Alignment Normal  Spontaneous Absent  Gaze-induced Left beating nystagmus with L gaze  Head shaking Horizontal Absent  Smooth Pursuits Intact (but induces symptoms)  Saccades Intact  Vestibulo-Occular Reflex  VOR 1 Head Only (x 1 viewing) horizontal x 30 sec no symptoms, vertical 20 sec c/o dizziness  VOR to Slow Head Movement Negative right;Negative left  VOR Cancellation Unable to maintain gaze  Auditory  Comments intact to scratch test and equal bilateral  Positional Testing  Sidelying Test Sidelying Right;Sidelying Left  Sidelying Right  Sidelying Right Duration 45 sec  Sidelying Right Symptoms Left nystagmus (mild and no c/o symptoms)  Sidelying Left  Sidelying Left Duration 3 minutes  Sidelying Left Symptoms Right nystagmus (ageotropic, moderately severe and lasting most of time)    Therapy Documentation Precautions:  Precautions Precautions: Fall Precaution Comments: NPO, gastrostomy tube, LLQ JP drain, abdominal surgical incision with pouch Restrictions Weight Bearing Restrictions: No Pain: Pain Assessment Pain Assessment: No/denies pain   See Function Navigator for Current Functional Status.   Therapy/Group: Individual Therapy  Elray McgregorCynthia Asa Baudoin  [name], [place] CarolinaPT [phone] 03/15/2016  03/15/2016, 6:02 PM

## 2016-03-15 NOTE — Progress Notes (Signed)
[facility] PHYSICAL MEDICINE & REHABILITATION     PROGRESS NOTE  Subjective/Complaints: Pt seen laying in bed this AM.  He states he slept well overnight.  He denies abdominal pain.   ROS: Denies vomiting, diarrhea, shortness of breath or chest pain.  Objective: Vital Signs: Blood pressure (!) 128/59, pulse 80, temperature 98.4 F (36.9 C), temperature source Oral, resp. rate 18, height 6\' 2"  (1.88 m), weight 77.1 kg (170 lb), SpO2 98 %. No results found.  Recent Labs  03/14/16 0534  WBC 9.2  HGB 8.8*  HCT 27.7*  PLT 365    Recent Labs  03/14/16 0526  NA 134*  K 4.5  CL 103  GLUCOSE 100*  BUN 30*  CREATININE 0.62  CALCIUM 8.5*   CBG (last 3)   Recent Labs  03/15/16 0008 03/15/16 0533 03/15/16 0840  GLUCAP 129* 103* 103*    Wt Readings from Last 3 Encounters:  03/14/16 77.1 kg (170 lb)  02/26/16 78.6 kg (173 lb 4.5 oz)  01/19/16 79.2 kg (174 lb 8 oz)    Physical Exam:  BP (!) 128/59 (BP Location: Left Arm)   Pulse 80   Temp 98.4 F (36.9 C) (Oral)   Resp 18   Ht 6\' 2"  (1.88 m)   Wt 77.1 kg (170 lb)   SpO2 98%   BMI 21.83 kg/m  Constitutional: NAD. Vital signs reviewed. Frail.  HENT: Normocephalic and atraumatic. +Stoma. Eyes: EOMI. No discharge.  Cardiovascular: RRR. No JVD. Respiratory: CTA bilaterally. Unlabored.  GI: He exhibits no distension. BS+. +Eakins pouch  Musculoskeletal: He exhibits no edema. He exhibits no tenderness.  Neurological: He is alert.  Sitting balance is good Speech with dysarthria, improving  Motor: 4/5 throughout  Skin: Skin is warm and dry. Abdominal wounds with dressing.   Assessment/Plan: 1. Functional deficits secondary to debility and encephalopathy which require 3+ hours per day of interdisciplinary therapy in a comprehensive inpatient rehab setting. Physiatrist is providing close team supervision and 24 hour management of active medical problems listed below. Physiatrist and rehab team continue to assess  barriers to discharge/monitor patient progress toward functional and medical goals.  Function:  Bathing Bathing position Bathing activity did not occur: Refused Position: Wheelchair/chair at sink  Bathing parts Body parts bathed by patient: Right arm, Left arm, Chest, Front perineal area, Right upper leg, Left upper leg Body parts bathed by helper: Buttocks, Right lower leg, Left lower leg, Back  Bathing assist Assist Level: Touching or steadying assistance(Pt > 75%)      Upper Body Dressing/Undressing Upper body dressing Upper body dressing/undressing activity did not occur: Safety/medical concerns What is the patient wearing?: Pull over shirt/dress     Pull over shirt/dress - Perfomed by patient: Thread/unthread right sleeve, Thread/unthread left sleeve, Put head through opening, Pull shirt over trunk Pull over shirt/dress - Perfomed by helper: Pull shirt over trunk        Upper body assist Assist Level: Set up      Lower Body Dressing/Undressing Lower body dressing Lower body dressing/undressing activity did not occur: Safety/medical concerns What is the patient wearing?: Pants, Shoes, [employer]- Performed by patient: Thread/unthread right pants leg Pants- Performed by helper: Thread/unthread right pants leg, Thread/unthread left pants leg, Pull pants up/down   Non-skid slipper socks- Performed by helper: Don/doff left sock, Don/doff right sock   Socks - Performed by helper: Don/doff right sock, Don/doff left sock   Shoes - Performed by helper: Don/doff right shoe,  Don/doff left shoe, Fasten right, Fasten left       TED Hose - Performed by helper: Don/doff right TED hose, Don/doff left TED hose  Lower body assist Assist for lower body dressing:  (Max A)      Toileting Toileting Toileting activity did not occur: No continent bowel/bladder event Toileting steps completed by patient: Adjust clothing prior to toileting Toileting steps completed by helper:  Performs perineal hygiene, Adjust clothing after toileting Toileting Assistive Devices: Grab bar or rail  Toileting assist Assist level:  (total assist)   Transfers Chair/bed transfer   Chair/bed transfer method: Stand pivot Chair/bed transfer assist level: Touching or steadying assistance (Pt > 75%) Chair/bed transfer assistive device: Armrests, Patent attorneyWalker     Locomotion Ambulation     Max distance: 100 ft Assist level: Touching or steadying assistance (Pt > 75%)   Wheelchair   Type: Manual Max wheelchair distance: 90 ft Assist Level: Touching or steadying assistance (Pt > 75%)  Cognition Comprehension Comprehension assist level: Follows basic conversation/direction with extra time/assistive device  Expression Expression assist level: Expresses basic 75 - 89% of the time/requires cueing 10 - 24% of the time. Needs helper to occlude trach/needs to repeat words.  Social Interaction Social Interaction assist level: Interacts appropriately 75 - 89% of the time - Needs redirection for appropriate language or to initiate interaction.  Problem Solving Problem solving assist level: Solves basic 50 - 74% of the time/requires cueing 25 - 49% of the time  Memory Memory assist level: Recognizes or recalls 50 - 74% of the time/requires cueing 25 - 49% of the time    Medical Problem List and Plan: 1. Debility and encephalopathy secondary to small bowel obstruction and associated complications   Cont CIR   Surgery evaluated, TFs per Surgery  Cont NPO   No crushed meds through feeding tube 2. DVT Prophylaxis/Anticoagulation: Pharmaceutical: Lovenox  3. Pain Management: Changed PCA to IV morphine every 4 hours prn severe pain   pain in general has been better controlled. Pt aware there will be ebbs and flows 4. Mood: team to provide ego support. LCSW to follow for evaluation and support.   Prozac started 1/18 5. Neuropsych: This patient is not fully capable of making decisions on his own behalf.   6. Skin/Wound Care: Continue air mattress overlay. Continue large Eakin's pouch to manage wound drainage. WOC for management. Encourage side lying to help manage MASD.  7. Malnutrition/Fluids/Electrolytes/Nutrition: continue TPN.   Albumin improving 8. Abdominal abscess: Leucocytosis resolving. Completed rocephin 1/17 9. Anemia of chronic illness:   Hb 8.8 on 1/29  Cont to monitor 10. Electrolyte abnormality/Acute renal insufficiency: Managed by TNA   Mag improved to 1/29 11. Encephalopathy: Changed haldol to IM prn for management of agitation/anxiety.  12. Hyperglycemia  Due to TFs  Slightly labile, overall controlled 1/30 13. Leukocytosis: Stable  WBC 9.2 on 03/14/2016  Afebrile  Ucx NG 14. Orthostasis: resolved 15. Sleep disturbance  Has tried Haldol without benefit, ativan causes hallucinations  Continue Xanax elixer, no meds per tube  Improved 16. Mild hyponatremia:   Na+ 134 on 1/29  LOS (Days) 18 A FACE TO FACE EVALUATION WAS PERFORMED  Ankit Karis Jubanil [name] 03/15/2016 9:13 AM

## 2016-03-15 NOTE — Progress Notes (Signed)
  Subjective: Pt sitting in chair   No complaints   Objective: Vital signs in last 24 hours: Temp:  [98.4 F (36.9 C)] 98.4 F (36.9 C) (01/30 0448) Pulse Rate:  [80] 80 (01/30 0448) Resp:  [18] 18 (01/30 0448) BP: (128-130)/(53-59) 128/59 (01/30 0448) SpO2:  [98 %] 98 % (01/30 0448) Last BM Date: 03/14/16  Intake/Output from previous day: 01/29 0701 - 01/30 0700 In: 30 [I.V.:30] Out: 901 [Urine:900; Drains:1] Intake/Output this shift: Total I/O In: -  Out: 400 [Urine:400]  Incision/Wound:open wound with Eakins pouch minimal drainage  Soft Abdomen   On TF   Lab Results:   Recent Labs  03/14/16 0534  WBC 9.2  HGB 8.8*  HCT 27.7*  PLT 365   BMET  Recent Labs  03/14/16 0526  NA 134*  K 4.5  CL 103  CO2 27  GLUCOSE 100*  BUN 30*  CREATININE 0.62  CALCIUM 8.5*   PT/INR No results for input(s): LABPROT, INR in the last 72 hours. ABG No results for input(s): PHART, HCO3 in the last 72 hours.  Invalid input(s): PCO2, PO2  Studies/Results: No results found.  Anti-infectives: Anti-infectives    Start     Dose/Rate Route Frequency Ordered Stop   03/14/16 2000  erythromycin ethylsuccinate (EES) 200 MG/5ML suspension 200 mg    Comments:  Give via G tube   200 mg Per Tube Every 12 hours 03/14/16 0805     03/08/16 1730  erythromycin ethylsuccinate (EES) 200 MG/5ML suspension 200 mg  Status:  Discontinued     200 mg Per Tube Every 8 hours 03/08/16 1634 03/14/16 0805   02/29/16 2000  cefTRIAXone (ROCEPHIN) 2 g in dextrose 5 % 50 mL IVPB     2 g 100 mL/hr over 30 Minutes Intravenous Every 24 hours 02/29/16 1804 03/02/16 2017   02/27/16 2000  cefTRIAXone (ROCEPHIN) 2 g in dextrose 5 % 50 mL IVPB  Status:  Discontinued     2 g 100 mL/hr over 30 Minutes Intravenous Every 24 hours 02/26/16 2137 02/29/16 1804      Assessment/Plan:  EC fistula - located at SB jejunal anastomosis - clinically improving, smaller wound/pouch and lower output - continues to  tolerate continuous TF's at3210mL/hr; continue erythromycin  - perc drain removed 1/25 - continue only liquid medications via J tube and no crushed meds - continue G-tube to gravity  FEN - NPO, IVF, TPN, TF at night VTE - Lovenox   Plan - ADVANCE TF  TO 20 CC  LOS: 18 days    [name] A. 03/15/2016

## 2016-03-15 NOTE — NC FL2 (Signed)
[place] MEDICAID FL2 LEVEL OF CARE SCREENING TOOL     IDENTIFICATION  Patient Name: [name] Birthdate: May 19, 1940 Sex: male Admission Date (Current Location): 02/26/2016  [facility] and [place] Number:  Producer, television/film/video and Address:  [facility] [place], [address], [place], [place] [address]      Provider Number: [id number]  Attending Physician Name and Address:  Ankit Karis Juba, MD  Relative Name and Phone Number:  Jamas Lav [phone]    Current Level of Care: Other (Comment) (Rehab Unit) Recommended Level of Care: Skilled Nursing Facility Prior Approval Number:    Date Approved/Denied:   PASRR Number: [id number]  Discharge Plan: SNF    Current Diagnoses: Patient Active Problem List   Diagnosis Date Noted  . Gastric dysmotility   . Post-operative state   . Hypoalbuminemia due to protein-calorie malnutrition (HCC)   . Gastrojejunostomy tube 02/06/2016 03/10/2016  . Jejunal anastomotic leak 03/10/2016  . Hyponatremia   . Anxiety and depression   . Sleep disturbance   . Orthostasis   . Leukocytosis   . Hyperglycemia   . FTT (failure to thrive) in adult   . Post-operative pain   . 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 Anemia of chronic disease 01/31/2016  . Stage 2 skin ulcer of sacral region 01/31/2016  . Hypomagnesemia 01/21/2016  . Hypokalemia 01/21/2016  . Pressure injury of skin 01/20/2016  . Pleural effusion   . Gastroesophageal reflux disease 01/19/2016  . Insomnia 01/19/2016  . On total parenteral nutrition (TPN) 01/17/2016  . Metabolic acidosis 01/17/2016  . Skin ulcer of abdominal wall, limited to breakdown of skin (HCC) 01/17/2016  . Dysphagia 01/17/2016  . Debility 01/17/2016  . 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 Vitamin B12 deficiency 01/02/2016  . BPH (benign prostatic hyperplasia)   . Posterior vitreous detachment   . Salzmann's nodular dystrophy  of left eye   . Hyperopia of both eyes with astigmatism and presbyopia   . Central pterygium of right eye   . Pressure ulcer of buttock, right, unstageable (HCC) 12/30/2015  . Hypernatremia 12/28/2015  . Tobacco abuse 12/09/2015  . Gastritis 12/09/2015  . Acute respiratory failure with hypoxemia (HCC)   . Protein-calorie malnutrition, severe 12/04/2015  . Encephalopathy acute 12/03/2015  . Anxiety state 09/28/2015  . Hyperlipidemia   . 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 Coronary atherosclerosis of native coronary artery 04/24/2013  . Essential hypertension, benign 04/24/2013  . Other and unspecified hyperlipidemia 04/24/2013  . Central pterygium 10/30/2012  . 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 Cataract, nuclear sclerotic senile 10/30/2012    Orientation RESPIRATION BLADDER Height & Weight     Self, Situation, Place  Normal Incontinent Weight: 170 lb (77.1 kg) Height:  6\' 2"  (188 cm)  BEHAVIORAL SYMPTOMS/MOOD NEUROLOGICAL BOWEL NUTRITION STATUS      Incontinent Feeding tube, TNA (Tube feeds continuous 20 cc-16 hrs begun)  AMBULATORY STATUS COMMUNICATION OF NEEDS Skin   Limited Assist Verbally Surgical wounds, Other (Comment) (Eakins Pouch changed every mon & thurs)                       Personal Care Assistance Level of Assistance  Bathing, Dressing Bathing Assistance: Maximum assistance   Dressing Assistance: Limited assistance Total Care Assistance: Limited assistance   Functional Limitations Info  Sight, Hearing, Speech Sight Info: Impaired Hearing Info: Adequate Speech Info: Adequate  SPECIAL CARE FACTORS FREQUENCY  PT (By licensed PT), OT (By licensed OT), Bowel and bladder program, Speech therapy     PT Frequency: 5x week OT Frequency: 5x week Bowel and Bladder Program Frequency: Timed toileting for both bowel and bladder   Speech Therapy Frequency: 5x week       Contractures Contractures Info: Not present    Additional Factors Info  Code Status, Allergies Code Status Info: Full Code Allergies Info: Flomax, Tamsulosin, Hcl, Lisinopril, Uroxatral Alfuzosin Hcl           Current Medications (03/15/2016):  This is the current hospital active medication list Current Facility-Administered Medications  Medication Dose Route Frequency Provider Last Rate Last Dose  . 0.9 %  sodium chloride infusion   Intravenous Q2200 De BlanchLuke Aaron Kinsinger, MD 100 mL/hr at 03/14/16 2202 100 mL/hr at 03/14/16 2202  . acetaminophen (TYLENOL) solution 1,000 mg  1,000 mg Per J Tube TID Karie SodaSteven Gross, MD   1,000 mg at 03/15/16 1410  . albuterol (PROVENTIL) (2.5 MG/3ML) 0.083% nebulizer solution 2.5 mg  2.5 mg Nebulization Q6H PRN Jacquelynn CreePamela S Love, PA-C      . Alprazolam 1 mg/ml suspension  0.5 mg Per Tube QHS,MR X 1 Evlyn Kanneramela S Love, PA-C   0.5 mg at 03/14/16 2203  . bisacodyl (DULCOLAX) suppository 10 mg  10 mg Rectal Q48H PRN De BlanchLuke Aaron Kinsinger, MD      . cycloSPORINE (RESTASIS) 0.05 % ophthalmic emulsion 1 drop  1 drop Both Eyes BID Jacquelynn CreePamela S Love, PA-C   1 drop at 03/15/16 [id number]  . diphenhydrAMINE (BENADRYL) injection 12.5-25 mg  12.5-25 mg Intravenous Q6H PRN Jacquelynn Creeamela S Love, PA-C   25 mg at 03/02/16 0018  . enoxaparin (LOVENOX) injection 40 mg  40 mg Subcutaneous Q24H Jacquelynn Creeamela S Love, PA-C   40 mg at 03/14/16 2205  . erythromycin ethylsuccinate (EES) 200 MG/5ML suspension 200 mg  200 mg Per Tube Q12H Karie SodaSteven Gross, MD   200 mg at 03/15/16 0932  . TPN (CLINIMIX-E) Adult   Intravenous Cyclic-See Admin Instructions Almon HerculesHaley P Baird, [facility]CRPH       And  . fat emulsion 20 % infusion 240 mL  240 mL Intravenous Continuous TPN Almon HerculesHaley P Baird, [facility]      . feeding supplement (JEVITY 1.2 CAL) liquid 1,000 mL  1,000 mL Per Tube Q24H Ankit Karis JubaAnil [name], MD 20 mL/hr at 03/15/16 1412 1,000 mL at 03/15/16 1412  . FLUoxetine (PROZAC) 20 MG/5ML solution 10 mg  10 mg Per Tube Daily Ankit Karis JubaAnil [name], MD   10 mg  at 03/15/16 1120  . lidocaine (LIDODERM) 5 % 1 patch  1 patch Transdermal Q24H Jacquelynn CreePamela S Love, PA-C   1 patch at 03/14/16 1739  . lidocaine (XYLOCAINE) 2 % jelly   Topical PRN Evlyn KannerPamela S Love, PA-C      . lip balm (CARMEX) ointment 1 application  1 application Topical BID Jacquelynn Creeamela S Love, PA-C   1 application at 03/13/16 2000  . liver oil-zinc oxide (DESITIN) 40 % ointment   Topical BID Pamela S Love, PA-C      . magic mouthwash  15 mL Oral QID PRN Jacquelynn CreePamela S Love, PA-C      . MEDLINE mouth rinse  15 mL Mouth Rinse q12n4p Evlyn Kanneramela S Love, PA-C   15 mL at 03/15/16 1121  . menthol-cetylpyridinium (CEPACOL) lozenge 3 mg  1 lozenge Oral PRN Evlyn KannerPamela S Love, PA-C      . methocarbamol (ROBAXIN) 500 mg in dextrose 5 %  50 mL IVPB  500 mg Intravenous Q6H PRN Jacquelynn Cree, PA-C   500 mg at 03/01/16 1857  . morphine 10 MG/5ML solution 2.5 mg  2.5 mg Per Tube Q3H PRN Jacquelynn Cree, PA-C   2.5 mg at 03/10/16 0930  . MUSCLE RUB CREA   Topical TID AC Pamela S Love, PA-C      . nicotine (NICODERM CQ - dosed in mg/24 hr) patch 7 mg  7 mg Transdermal Daily Jacquelynn Cree, PA-C   7 mg at 03/15/16 1610  . nitroGLYCERIN (NITROSTAT) SL tablet 0.4 mg  0.4 mg Sublingual Q5 min PRN Jacquelynn Cree, PA-C      . omeprazole (PRILOSEC) 2 mg/mL oral suspension SUSP 40 mg  40 mg Per Tube Daily Karie Soda, MD   40 mg at 03/15/16 0933  . phenol (CHLORASEPTIC) mouth spray 2 spray  2 spray Mouth/Throat PRN Evlyn Kanner Love, PA-C      . polyvinyl alcohol (LIQUIFILM TEARS) 1.4 % ophthalmic solution 1 drop  1 drop Both Eyes Q8H PRN Jacquelynn Cree, PA-C   1 drop at 03/13/16 1810  . prochlorperazine (COMPAZINE) injection 5-10 mg  5-10 mg Intravenous Q4H PRN Jacquelynn Cree, PA-C   5 mg at 03/13/16 2243  . sodium chloride flush (NS) 0.9 % injection 10-40 mL  10-40 mL Intracatheter Q12H Evlyn Kanner Love, PA-C   10 mL at 03/15/16 0612  . sodium chloride flush (NS) 0.9 % injection 10-40 mL  10-40 mL Intracatheter PRN Evlyn Kanner Love, PA-C   30 mL at 03/14/16 0750   . sodium phosphate (FLEET) 7-19 GM/118ML enema 1 enema  1 enema Rectal Once PRN Jacquelynn Cree, PA-C         Discharge Medications: Please see discharge summary for a list of discharge medications.  Relevant Imaging Results:  Relevant Lab Results:   Additional Information SSN: [id number] Takes liquid meds, NPO  Dianara Smullen, Lemar Livings, LCSW

## 2016-03-15 NOTE — Progress Notes (Signed)
Physical Therapy Session Note  Patient Details  Name: [name] MRN: [id number] Date of Birth: 1941-01-12  Today's Date: 03/15/2016 PT Individual Time: 0800-0905 PT Individual Time Calculation (min): 65 min   Short Term Goals: Week 3:  PT Short Term Goal 1 (Week 3): Pt will ambulate 100 ft supervision with RW  PT Short Term Goal 2 (Week 3): Pt will demonstrate sit-to-stand and stand pivot transfers Supervision with RW consistently PT Short Term Goal 3 (Week 3): Pt will ascend and descend 3 standard steps with bilateral rails min A PT Short Term Goal 4 (Week 3): Pt will demonstrate ability to travel 100 ft supervision in W/C with minimal need for rest breaks before continung with therapy. PT Short Term Goal 5 (Week 3): Pt will demonstrate ability to perform BUE supported standing tolerance for 3 min consistently  Skilled Therapeutic Interventions/Progress Updates:  Patient awake in bed upon arrival. Patient transferred supine > sit to R using rail with [facility] raised, stating," Help me!" with encouragement to attempt on his own requiring verbal/tactile cues for sequencing and technique with mod A overall. Patient performed sit > stand from EOB with min A lifting assist to facilitate anterior weight shift. Patient stood using RW to obtain BP, see below. Patient unaware of bowel incontinence. Ambulated to and from bathroom using RW with min A and cues for safe management of AD for toileting with steady assist and total A for hygiene and clothing management after bowel movement on commode. Performed stand pivot transfer using RW from wheelchair <> therapy mat with min A for anterior weight shift. Patient c/o dizziness throughout session with bed mobility, sit <> stand, standing, and seated EOM with eye/head turns. Patient left sitting in wheelchair with all needs within reach.   Blood Pressure: Supine____115/53______Sitting_____127/63______Standing____117/53______  Therapy  Documentation Precautions:  Precautions Precautions: Fall Precaution Comments: NPO, gastrostomy tube, LLQ JP drain, abdominal surgical incision with pouch Restrictions Weight Bearing Restrictions: No Pain: Pain Assessment Pain Assessment: No/denies pain  See Function Navigator for Current Functional Status.   Therapy/Group: Individual Therapy  Maison Agrusa, Prudencio PairRebecca A 03/15/2016, 10:04 AM

## 2016-03-16 ENCOUNTER — Inpatient Hospital Stay (HOSPITAL_COMMUNITY): Payer: Medicare Other | Admitting: Occupational Therapy

## 2016-03-16 ENCOUNTER — Inpatient Hospital Stay (HOSPITAL_COMMUNITY): Payer: Medicare Other | Admitting: Physical Therapy

## 2016-03-16 ENCOUNTER — Inpatient Hospital Stay (HOSPITAL_COMMUNITY): Payer: Medicare Other | Admitting: Speech Pathology

## 2016-03-16 LAB — GLUCOSE, CAPILLARY
GLUCOSE-CAPILLARY: 117 mg/dL — AB (ref 65–99)
GLUCOSE-CAPILLARY: 98 mg/dL (ref 65–99)
GLUCOSE-CAPILLARY: 85 mg/dL (ref 65–99)
Glucose-Capillary: 89 mg/dL (ref 65–99)
Glucose-Capillary: 128 mg/dL — ABNORMAL HIGH (ref 65–99)

## 2016-03-16 MED ORDER — FLUOXETINE HCL 20 MG/5ML PO SOLN
20.0000 mg | Freq: Every day | ORAL | Status: DC
  Administered 2016-03-18 – 2016-03-30 (×13): 20 mg
  Filled 2016-03-16 (×14): qty 5

## 2016-03-16 MED ORDER — TRACE MINERALS CR-CU-MN-SE-ZN 10-1000-500-60 MCG/ML IV SOLN
INTRAVENOUS | Status: AC
  Administered 2016-03-16: 18:00:00 via INTRAVENOUS
  Filled 2016-03-16: qty 1800

## 2016-03-16 MED ORDER — JEVITY 1.2 CAL PO LIQD
1000.0000 mL | ORAL | Status: DC
  Filled 2016-03-16 (×2): qty 1000

## 2016-03-16 MED ORDER — ERYTHROMYCIN ETHYLSUCCINATE 200 MG/5ML PO SUSR
200.0000 mg | Freq: Three times a day (TID) | ORAL | Status: DC
  Administered 2016-03-16 – 2016-03-25 (×22): 200 mg
  Filled 2016-03-16 (×28): qty 5

## 2016-03-16 MED ORDER — FAT EMULSION 20 % IV EMUL
240.0000 mL | INTRAVENOUS | Status: AC
  Administered 2016-03-16: 240 mL via INTRAVENOUS
  Filled 2016-03-16: qty 250

## 2016-03-16 MED ORDER — ZINC OXIDE 40 % EX OINT
TOPICAL_OINTMENT | Freq: Every day | CUTANEOUS | Status: DC
  Administered 2016-03-17: 08:00:00 via TOPICAL
  Administered 2016-03-20: 1 via TOPICAL
  Administered 2016-03-21 – 2016-03-28 (×8): via TOPICAL
  Administered 2016-03-29: 1 via TOPICAL
  Administered 2016-03-30: 08:00:00 via TOPICAL
  Filled 2016-03-16: qty 114

## 2016-03-16 MED ORDER — SODIUM PHOSPHATES 15 MMOLE/5ML IV SOLN: INTRAVENOUS | Status: DC

## 2016-03-16 NOTE — Consult Note (Signed)
WOC by to check Eakin's pouch and patient status.  Patient reports feeling ok and still not sleeping well.  He is in the bed when I arrive today.  His wife reports he may be DC to [facility] tomorrow.  Eakin pouch still intact with good seal and minimal drainage.  I will plan to change Eakin's pouch first thing in the am to prepare for possible DC and have a pattern for the SNF to use as well.  SNF will need small Eakin pouches available for patient, changing 1x per week.   Dali Kraner [facility] MSN,RN,CWOCN, CNS [phone]

## 2016-03-16 NOTE — Progress Notes (Signed)
Physical Therapy Session Note  Patient Details  Name: [name] MRN: [id number] Date of Birth: 04-17-1940  Today's Date: 03/16/2016 PT Individual Time: 1600-1630 PT Individual Time Calculation (min): 30 min   Short Term Goals: Week 3:  PT Short Term Goal 1 (Week 3): Pt will ambulate 100 ft supervision with RW  PT Short Term Goal 2 (Week 3): Pt will demonstrate sit-to-stand and stand pivot transfers Supervision with RW consistently PT Short Term Goal 3 (Week 3): Pt will ascend and descend 3 standard steps with bilateral rails min A PT Short Term Goal 4 (Week 3): Pt will demonstrate ability to travel 100 ft supervision in W/C with minimal need for rest breaks before continung with therapy. PT Short Term Goal 5 (Week 3): Pt will demonstrate ability to perform BUE supported standing tolerance for 3 min consistently  Skilled Therapeutic Interventions/Progress Updates:    no c/o pain but reporting fatigue.  Session focus on activity tolerance, transfers, and ambulation.    PT applied TEDs, pants, and gripper socks total assist for time management.  Supine>sit with HOB elevated and bed rails with supervision.  Sit<>stand with steady assist and verbal cues to push up from seated surface and forward weight shift.  Gait training 2x100' with RW and close supervision with verbal cues for upright posture.  Pt returned to bed at end of session, min assist to lift LLE into bed and mod assist to roll to supine.  Pt positioned to comfort with call bell in reach and needs met.   Therapy Documentation Precautions:  Precautions Precautions: Fall Precaution Comments: NPO, gastrostomy tube, LLQ JP drain, abdominal surgical incision with pouch Restrictions Weight Bearing Restrictions: No   See Function Navigator for Current Functional Status.   Therapy/Group: Individual Therapy  Earnest Conroy Penven-Crew 03/16/2016, 4:47 PM

## 2016-03-16 NOTE — Progress Notes (Signed)
Social Work Patient ID: [name], male   DOB: 07-29-1940, 76 y.o.   MRN: [id number]  Met with pt, wife and daughter to discuss team conference progress toward his goals and medical readiness for transfer to next venue of care. MD feels he is medically stable as long as surgery continues to follow. They are adjusting tube feeds and increasing with their monitoring. Spoke with Claiborne Billings from [facility] regarding coverage at [place], due to out of network also spoke with [facility] who will try to get approval. This is the facility both wife and daughter would like him to go to after rehab. Will await input from [facility] regarding approval, all aware and agreeable. Both are pleased with the progress he has made this week with the tube feedings.

## 2016-03-16 NOTE — Progress Notes (Signed)
Occupational Therapy Session Note  Patient Details  Name: [name] MRN: [id number] Date of Birth: 1940/03/02  Today's Date: 03/16/2016   OT Individual Time: 1001-1100 OT Individual Time Calculation (min): 59 min   Short Term Goals: Week 3:  OT Short Term Goal 1 (Week 3): Pt will tolerate 5 minutes of standing in preparation for ADL tasks OT Short Term Goal 2 (Week 3): Pt will complete LB dressing with Mod A and ADL AE OT Short Term Goal 3 (Week 3): Pt will recall energy conservation techniques with min verbal cues during ADL task  Skilled Therapeutic Interventions/Progress Updates:    1:1 OT session focused on increased activity tolerance, functional mobility, standing endurance, and modified bathing/dressing. Pt greeted in bed with nursing finishing wound care. Pt transferred OOB and ambulated 5 feet to wc w/ Mod A. 1 posterior LOB requiring Max A to correct. Pt copleted grooming tasks at the sink including shaving with overall set-up A and 2 rest breaks. Sit<>stand at the sink with Mod A. Worked on dynamic standing balance and increased independence with bathing tasks. Min/Mod A to maintain standing balance while reaching to wash buttocks today- OT assist for thoroughness. Pt is progressing with LB dressing, requiring overall Mod/Max A depending on level of fatigue. Pt left seated in wc at end of session with needs met.   Therapy Documentation Precautions:  Precautions Precautions: Fall Precaution Comments: NPO, gastrostomy tube, LLQ JP drain, abdominal surgical incision with pouch Restrictions Weight Bearing Restrictions: No Pain:  denies pain  See Function Navigator for Current Functional Status.   Therapy/Group: Individual Therapy  Valma Cava 03/16/2016, 10:40 AM

## 2016-03-16 NOTE — Progress Notes (Addendum)
Nutrition Follow-up  DOCUMENTATION CODES:   Non-severe (moderate) malnutrition in context of chronic illness  INTERVENTION:  Continue TPN per Pharmacy.  Continue Jevity 1.2 formula via J-tube at 20 ml/hr x 16 hours (2pm-6am) which provides 384 kcal, 18 grams of protein, and 259 ml of free water.   Once pt able to advance past 20 ml/hr, recommend switching formula to Jevity 1.5 formulaand advance by 10 ml every 4-6 hours or as tolerated to goal rate of 65 ml/hr x 20 hours (may be off TF for up to 4 hours) with 30 ml Prostat BID to provide 2150 kcal, 113 grams of protein, 988 ml of free water with TPN wean as tolerated.  NUTRITION DIAGNOSIS:   Inadequate oral intake related to altered GI function (Enteric fistula) as evidenced by NPO status (TPN dependant); ongoing  GOAL:   Patient will meet greater than or equal to 90% of their needs; met  MONITOR:   Labs, Weight trends, TF tolerance, Skin, I & O's, Other (Comment) (TPN tolerance)  REASON FOR ASSESSMENT:   Malnutrition Screening Tool    ASSESSMENT:   76 y/o mle w/ complex PMHx including COPD, GERD, CAD, PUD. Underwent gastrectomy in August due to stricture w/ post op complications requiring jejunostomy. He was most recently admitted for SBO and enteric fistula. He is on bowel rest, TPN dependent.   Pt has been tolerating his tube feeds at 20 ml/hr. Per Surgery MD, EC fistula wound observed to be smaller with less output. Plans to continue at 20 ml/hr for now and will advance on Friday as tolerated. Once pt able to advance past 20 ml/hr, recommend switching formula to Jevity 1.5 formulaand advance by 10 ml every 4-6 hours or as tolerated to goal rate of 65 ml/hr x 20 hours (may be off TF for up to 4 hours) with 30 ml Prostat BID to provide 2150 kcal, 113 grams of protein, 988 ml of free water. Per wife at bedside, she reports possible discharge to facility tomorrow.   Per Pharmacy note, Cycle Clinimix E 5/15, infuse 1800 mls  over 12 hrs:  50 ml/hr x 1 hr, then 170 ml/hr x 10 hrs, then 50 mL/hr x 1 hr. Continue lipids 20 ml/hr x 12 hrs.  Current TPN and TF at 20 ml/hr provides 2238 kcal (100% of needs), and 113 grams of protein.   RD to continue to monitor.   Diet Order:  Diet NPO time specified Except for: Ice Chips TPN (CLINIMIX 5/15) Adult with electrolyte additives  Skin:  Wound (see comment) (Midline wound with EC fistula)  Last BM:  1/31  Height:   Ht Readings from Last 1 Encounters:  02/27/16 6' 2" (1.88 m)    Weight:   Wt Readings from Last 1 Encounters:  03/16/16 170 lb 6.7 oz (77.3 kg)    Ideal Body Weight:  86.36 kg  BMI:  Body mass index is 21.88 kg/m.  Estimated Nutritional Needs:   Kcal:  2100-2300 (29-32 kcal/kg bw)  Protein:  95-110 g (1.3-1.5 g/kg bw)  Fluid:  >1.8 L (25 ml/kg bw)  EDUCATION NEEDS:   No education needs identified at this time   , MS, RD, LDN Pager # [phone] After hours/ weekend pager # [phone]  

## 2016-03-16 NOTE — Consult Note (Signed)
WOC re-consulted by surgery team for Eakins pouch and EC fistula.  WOC nurse team has followed patient since inpatient admission and continued to follow for inpatient rehab admission at [facility].  See consultation notes.  I did stop by to check on the patient today, he seems in good spirits, was getting back to bed when I arrived. Wife in the room. Smaller Eakin's pouch is working very well.  Will plan to change on Thursday this week.   Makyna Niehoff [facility] MSN,RN,CWOCN, CNS [phone]

## 2016-03-16 NOTE — Progress Notes (Signed)
Physical Therapy Session Note  Patient Details  Name: [name] MRN: [id number] Date of Birth: 04/15/1940  Today's Date: 03/16/2016 PT Individual Time: 1302-1401 PT Individual Time Calculation (min): 59 min   Short Term Goals: Week 3:  PT Short Term Goal 1 (Week 3): Pt will ambulate 100 ft supervision with RW  PT Short Term Goal 2 (Week 3): Pt will demonstrate sit-to-stand and stand pivot transfers Supervision with RW consistently PT Short Term Goal 3 (Week 3): Pt will ascend and descend 3 standard steps with bilateral rails min A PT Short Term Goal 4 (Week 3): Pt will demonstrate ability to travel 100 ft supervision in W/C with minimal need for rest breaks before continung with therapy. PT Short Term Goal 5 (Week 3): Pt will demonstrate ability to perform BUE supported standing tolerance for 3 min consistently  Skilled Therapeutic Interventions/Progress Updates:  Pt was in bed upon arrival. Wife was present and observed therapy today. Pt was complaining of no pain before or after therapy.   Pt transferred into sitting at EOB with supervision and with HOB elevated and use of handrails.   All stand pivots and ambulatory transfers with RW were min A with lowering down only.   Treated L apogeotropic horizontal canal BPPV via technique outlined by Rivka BarbaraZuma e Maia, F.  "New Treatment Strategy for Apogeotropic Horizontal Canal Benign Paroxysmal Positional Vertigo."  Audiol Res. 2016 Aug 23; 6(2):163. Pt was premedicated for nausea.   Pt ascended forward and descended backwards on 3 six inch step with min A after a 10 ft ambulation to staircase with RW that required supervision. Pt stopped before reaching 4th step due to fatigue and dizziness.    Pt self propelled 40 ft with W/C supervision until pt expressed feeling fatigued and stopped pushing.   Pt was left in W/C in room with wife and all needs within reach. Reassessment of dizziness symptoms should continue to determine need to repeat  vestibular treatment.       Therapy Documentation Precautions:  Precautions Precautions: Fall Precaution Comments: NPO, gastrostomy tube, LLQ JP drain, abdominal surgical incision with pouch Restrictions Weight Bearing Restrictions: No  Pain:  No complaint of pain   See Function Navigator for Current Functional Status.   Therapy/Group: Individual Therapy  Rudie MeyerJeffrey Hosie Sharman 03/16/2016, 12:50 PM

## 2016-03-16 NOTE — Plan of Care (Signed)
Problem: RH Car Transfers Goal: LTG Patient will perform car transfers with assist (PT) LTG: Patient will perform car transfers with assistance (PT).  Outcome: Not Applicable Date Met: 99/69/24 DC changed to SNF  Problem: RH Ambulation Goal: LTG Patient will ambulate in home environment (PT) LTG: Patient will ambulate in home environment, # of feet with assistance (PT).  Outcome: Not Applicable Date Met: 93/24/19 DC changed to SNF  Problem: RH Wheelchair Mobility Goal: LTG Patient will propel w/c in home environment (PT) LTG: Patient will propel wheelchair in home environment, # of feet with assistance (PT).  Outcome: Not Applicable Date Met: 91/44/45 DC changed to SNF

## 2016-03-16 NOTE — Progress Notes (Signed)
[place] PHYSICAL MEDICINE & REHABILITATION     PROGRESS NOTE  Subjective/Complaints: Pt laying in bed this AM.  He tolerated increase in tube feeds, confirmed with nursing.  He states he slept well overnight.   ROS: Denies vomiting, diarrhea, shortness of breath or chest pain.  Objective: Vital Signs: Blood pressure 122/66, pulse 71, temperature 97.9 F (36.6 C), temperature source Oral, resp. rate 18, height $RemoveBefor [id number]$6\' 2" %. No results found.  Recent Labs  03/14/16 0534  WBC 9.2  HGB 8.8*  HCT 27.7*  PLT 365    Recent Labs  03/14/16 0526  NA 134*  K 4.5  CL 103  GLUCOSE 100*  BUN 30*  CREATININE 0.62  CALCIUM 8.5*   CBG (last 3)   Recent Labs  03/15/16 2351 03/16/16 0408 03/16/16 0807  GLUCAP 122* 117* 98    Wt Readings from Last 3 Encounters:  03/16/16 77.3 kg (170 lb 6.7 oz)  02/26/16 78.6 kg (173 lb 4.5 oz)  01/19/16 79.2 kg (174 lb 8 oz)    Physical Exam:  BP 122/66 (BP Location: Left Arm)   Pulse 71   Temp 97.9 F (36.6 C) (Oral)   Resp 18   Ht 6\' 2"  (1.88 m)   Wt 77.3 kg (170 lb 6.7 oz)   SpO2 98%   BMI 21.88 kg/m  Constitutional: NAD. Vital signs reviewed. Frail.  HENT: Normocephalic and atraumatic. +Stoma. Eyes: EOMI. No discharge.  Cardiovascular: RRR. No JVD. Respiratory: CTA bilaterally. Unlabored.  GI: He exhibits no distension. BS+. +Eakins pouch  Musculoskeletal: He exhibits no edema. He exhibits no tenderness.  Neurological: He is alert.  Sitting balance is good Speech with dysarthria Motor: 4/5 throughout (unchanged) Skin: Skin is warm and dry. Abdominal wounds with dressing.   Assessment/Plan: 1. Functional deficits secondary to debility and encephalopathy which require 3+ hours per day of interdisciplinary therapy in a comprehensive inpatient rehab setting. Physiatrist is providing close team supervision and 24 hour management of active medical problems listed  below. Physiatrist and rehab team continue to assess barriers to discharge/monitor patient progress toward functional and medical goals.  Function:  Bathing Bathing position Bathing activity did not occur: Refused Position: Wheelchair/chair at sink  Bathing parts Body parts bathed by patient: Right arm, Left arm, Chest, Front perineal area, Right upper leg, Right lower leg, Left upper leg, Left lower leg Body parts bathed by helper: Buttocks  Bathing assist Assist Level: Touching or steadying assistance(Pt > 75%), Set up   Set up : To obtain items, To open containers, To adjust water temperature  Upper Body Dressing/Undressing Upper body dressing Upper body dressing/undressing activity did not occur: Safety/medical concerns What is the patient wearing?: Pull over shirt/dress     Pull over shirt/dress - Perfomed by patient: Thread/unthread right sleeve, Thread/unthread left sleeve, Put head through opening Pull over shirt/dress - Perfomed by helper: Pull shirt over trunk        Upper body assist Assist Level: Touching or steadying assistance(Pt > 75%)      Lower Body Dressing/Undressing Lower body dressing Lower body dressing/undressing activity did not occur: Safety/medical concerns What is the patient wearing?: Pants, Non-skid slipper socks     Pants- Performed by patient: Thread/unthread left pants leg, Thread/unthread right pants leg Pants- Performed by helper: Pull pants up/down Non-skid slipper socks- Performed by patient: Don/doff right sock Non-skid slipper socks- Performed by helper: Don/doff left sock, Don/doff right sock  Socks - Performed by helper: Don/doff right sock, Don/doff left sock   Shoes - Performed by helper: Don/doff right shoe, Don/doff left shoe, Fasten right, Fasten left       TED Hose - Performed by helper: Don/doff right TED hose, Don/doff left TED hose  Lower body assist Assist for lower body dressing:  (Mod A)      Toileting Toileting  Toileting activity did not occur: No continent bowel/bladder event Toileting steps completed by patient: Adjust clothing prior to toileting Toileting steps completed by helper: Performs perineal hygiene, Adjust clothing after toileting Toileting Assistive Devices: Grab bar or rail  Toileting assist Assist level: Touching or steadying assistance (Pt.75%)   Transfers Chair/bed transfer   Chair/bed transfer method: Ambulatory Chair/bed transfer assist level: Touching or steadying assistance (Pt > 75%) Chair/bed transfer assistive device: Armrests, Patent attorney     Max distance: 112' Assist level: Touching or steadying assistance (Pt > 75%)   Wheelchair   Type: Manual Max wheelchair distance: 150 Assist Level: Total assistance (Pt < 25%)  Cognition Comprehension Comprehension assist level: Follows basic conversation/direction with extra time/assistive device  Expression Expression assist level: Expresses basic 75 - 89% of the time/requires cueing 10 - 24% of the time. Needs helper to occlude trach/needs to repeat words.  Social Interaction Social Interaction assist level: Interacts appropriately with others with medication or extra time (anti-anxiety, antidepressant).  Problem Solving Problem solving assist level: Solves basic 50 - 74% of the time/requires cueing 25 - 49% of the time  Memory Memory assist level: Recognizes or recalls 75 - 89% of the time/requires cueing 10 - 24% of the time    Medical Problem List and Plan: 1. Debility and encephalopathy secondary to small bowel obstruction and associated complications   Cont CIR   Surgery evaluated, TFs per Surgery, tolerating  Cont NPO   No crushed meds through feeding tube 2. DVT Prophylaxis/Anticoagulation: Pharmaceutical: Lovenox  3. Pain Management: IV morphine every 4 hours prn severe pain  4. Mood: team to provide ego support. LCSW to follow for evaluation and support.   Prozac started 1/18 5.  Neuropsych: This patient is not fully capable of making decisions on his own behalf.  6. Skin/Wound Care: Continue air mattress overlay. Continue large Eakin's pouch to manage wound drainage. WOC for management. Encourage side lying to help manage MASD.  7. Malnutrition/Fluids/Electrolytes/Nutrition: continue TPN.   Albumin improving 8. Abdominal abscess: Leucocytosis resolving. Completed rocephin 1/17 9. Anemia of chronic illness:   Hb 8.8 on 1/29  Cont to monitor 10. Electrolyte abnormality/Acute renal insufficiency: Managed by TNA   Mag improved to 1/29 11. Encephalopathy: Changed haldol to IM prn for management of agitation/anxiety.  12. Hyperglycemia  Due to TPN  Overall controlled 1/31 13. Leukocytosis: Stable  WBC 9.2 on 03/14/2016  Afebrile  Ucx NG 14. Orthostasis: resolved 15. Sleep disturbance  Has tried Haldol without benefit, ativan causes hallucinations  Continue Xanax elixer, no meds per tube  Improved 16. Mild hyponatremia:   Na+ 134 on 1/29  LOS (Days) 19 A FACE TO FACE EVALUATION WAS PERFORMED  [name] Karis Juba 03/16/2016 9:04 AM

## 2016-03-16 NOTE — Progress Notes (Signed)
PHARMACY - TOTAL PARENTERAL NUTRITION CONSULT NOTE   Pharmacy Consult:  TPN Indication:  SBO and new EC fistula 02/13/16  Patient Measurements: Height: _0  (188 cm) Weight: 170 lb 6.7 oz (77.3 kg) IBW/kg (Calculated) : 82.2   Body mass index is 21.88 kg/m.  Assessment:  77 YOM with history of multiple procedures and extended hospitalization for recurrent partial GOO 2/2 pyloric stricture.  Patient was on TPN for several days, then discharged on 12/29/16 with J/G tube in place for TF. He was unable to tolerate TF and was transitioned back to TPN by [facility] on 01/05/17.  Patient was readmitted on 01/19/17 with partial SBO.  Patient failed MBS on 01/26/16 and did not tolerate bolus feeds through G-tube on 01/27/16. On 12/23, he underwent ex-lap with LoA, segmental SBR, and gastrojejunostomy. On 02/12/16, TF were found draining from midline incision with CT abd showing bowel perforation at [place] site and development of abscesses.   GI: EC fistula at SB jejunal anastomosis >> much smaller. Prealbumin WNL at 24>26.7. Drain O/P 1316m. Erythromycin started 1/23, Prilosec, having BM. Nauseous when TF rate advanced previously. Compazine PRN. Failed 11/26 MBS. Tolerating TF at 20 ml/h x 2 days Endo: no hx DM - CBGs adequately controlled (CBGs 78-98 off TPN, 117-125 on TPN) Insulin requirements in the past 24 hours:  SSI d/c'd 1/14 Lytes: Na 134, coCa~10.4  Renal: SCr 0.62 stable, CrCL ~85 ml/min - good UOP 0.5 ml/kg/hr Pulm: stable on RA - nicotine patch Cards: orthostatic hypotension improved - VSS Hepatobil: AST 45, ALT wnl, alk phos up 203. TG WNL. Neuro: Prozac, Xanax, lidocaine patch, PRN morphine, PRN APAP Heme/Onc: S/p Iron dextran ID: s/p CTX 1/13 > 1/17 for UTI, UCx negative - afebrile, WBC WNL Best Practices: Lovenox, MC TPN Access: PICC TPN start date: P[facility] > [facility] 01/20/16 > [facility] 02/26/15  Home TPN: 2156 kCal and 75gm protein per day (405g CHO = 1376 kCal, 50g lipid = 480 kCal)  Nutritional  Goals: 2100 - 2300 kCal and 95 - 110 g protein per day  Current Nutrition:  Jevity 1.2 at 20 ml/hr for 16 hrs (384 kCal, 18 gm protein) Clinimix E 5/15 + lipids (1758 kCal, 90g protein)   Plan:  - Per Surgery, continue Jevity 1.2 at 20 ml/hr over 16 hours (1400 to 0600). Per discussion with Surgery PA, to continue at this rate for now, possibly increase again on Friday - Cycle Clinimix E 5/15, infuse 1800 mls over 12 hrs:  50 ml/hr x 1 hr, then 170 ml/hr x 10 hrs, then 50 mL/hr x 1 hr. Continue lipids 20 ml/hr x 12 hrs - Clinimix and TF at 15 ml/hr will provide 2142 kCal and 108 gm protein per day, meeting ~100% of patient's needs. - Daily multivitamin in TPN (liquid multivitamin not available) - Trace elements every other day due to med on critical shortage (next 1/31) - F/U with TF tolerance to advance, ability to wean TPN - Standard TPN labs on Mon and Thurs   HElicia Lamp PharmD, BCPS Clinical Pharmacist 03/16/2016 8:22 AM

## 2016-03-16 NOTE — Progress Notes (Signed)
Speech Language Pathology Daily Session Note  Patient Details  Name: [name] Weakland MRN: [id number] Date of Birth: [birth year]  Today's Date: 03/16/2016 SLP Individual Time: [id number] SLP Individual Time Calculation (min): 45 min  Short Term Goals: Week 3: SLP Short Term Goal 1 (Week 3): Pt will utilize speech intelligibility strategies at the sentence level with Min A verbal cues to achieve  ~50% intelligibility.  SLP Short Term Goal 2 (Week 3): Pt will complete semi-complex tasks with Mod A verbal cues. SLP Short Term Goal 3 (Week 3): Pt will sustain attention to functional tasks for ~ 30 minutes with Min A verbal cues.  SLP Short Term Goal 4 (Week 3): Pt will utilize external memory aids to recall new, daily information with Min A verbal cues.  SLP Short Term Goal 5 (Week 3): Pt will consistently demonstrate O x 4 with supervision verbal cues.  SLP Short Term Goal 6 (Week 3): Pt will consume therapeutic trials of ice chips with minimal overt s/s of aspiration over 2 consecutive sessions to demonstrate readiness for repeat MBS.   Skilled Therapeutic Interventions: Skilled treatment session focused on dysphagia and cognition goals. SLP facilitated the session by providing trials of 1/2 tsp to 1 tsp boluses of room temperature water after oral care. Pt with the appearance of timely swallow initiation and visible hyolaryngeal excursion with 10 trials. Pt's voice remained clear for all vocal checks. Pt required Max a multimodal cues for recall of orientation information, calendar made and placed on wall close to pt. Pt required Max a multimodal cues for correct use of calendar. Pt able to sustain his attention in moderately distracting environment for ~30 with supervision cues. Overall, pt with significant improvement in vocal intensity during session for overall improvement in speech intelligibility at the sentence level for > 50% intelligibility at the phrase level. At the end of the session, pt  began gagging but no vomiting possibly d/[name] increase intake of thin liquids. Nursing aware and pt stopped before SLP left room. Pt left upright in bed with all needs within reach and nursing aware of pt's gagging. Continue current plan of care.      Function:  Eating Eating   Modified Consistency Diet: Yes (Trials of ice chips/water with SLP)             Cognition Comprehension Comprehension assist level: Follows basic conversation/direction with extra time/assistive device  Expression   Expression assist level: Expresses basic 75 - 89% of the time/requires cueing 10 - 24% of the time. Needs helper to occlude trach/needs to repeat words.  Social Interaction Social Interaction assist level: Interacts appropriately with others with medication or extra time (anti-anxiety, antidepressant).  Problem Solving Problem solving assist level: Solves basic 75 - 89% of the time/requires cueing 10 - 24% of the time  Memory Memory assist level: Recognizes or recalls 75 - 89% of the time/requires cueing 10 - 24% of the time    Pain    Therapy/Group: Individual Therapy Avi Kerschner B. Dreama Saaverton, M.S., CCC-SLP Speech-Language Pathologist  Saafir Abdullah 03/16/2016, 11:21 AM

## 2016-03-16 NOTE — Progress Notes (Signed)
[facility] Progress Note     Subjective: Pt states he feels "great". He is not having abdominal pain, nausea or vomiting. No new complaints.   Objective: Vital signs in last 24 hours: Temp:  [97.7 F (36.5 C)-97.9 F (36.6 C)] 97.9 F (36.6 C) (01/31 0435) Pulse Rate:  [71-79] 71 (01/31 0435) Resp:  [18-20] 18 (01/31 0435) BP: (122-129)/(57-66) 122/66 (01/31 0435) SpO2:  [98 %] 98 % (01/31 0435) Weight:  [170 lb 6.7 oz (77.3 kg)] 170 lb 6.7 oz (77.3 kg) (01/31 0435) Last BM Date: 03/16/16  Intake/Output from previous day: 01/30 0701 - 01/31 0700 In: 1850 [I.V.:1040; NG/GT:330] Out: 2600 [Urine:900; Drains:1700] Intake/Output this shift: No intake/output data recorded.  PE: Gen:  Alert, NAD, pleasant, cooperative, lying in bed, well appearing Card:  RRR, no M/G/R heard Pulm:  Rate and effort normal Abd: Soft, nondistended, +BS, EC fistula appears smaller. No TTP. G tube site clean, dressed and intact Skin: no rashes noted, warm and dry, not diaphoretic  Lab Results:   Recent Labs  03/14/16 0534  WBC 9.2  HGB 8.8*  HCT 27.7*  PLT 365   BMET  Recent Labs  03/14/16 0526  NA 134*  K 4.5  CL 103  CO2 27  GLUCOSE 100*  BUN 30*  CREATININE 0.62  CALCIUM 8.5*   PT/INR No results for input(s): LABPROT, INR in the last 72 hours. CMP     Component Value Date/Time   NA 134 (L) 03/14/2016 0526   NA 139 01/14/2016   K 4.5 03/14/2016 0526   CL 103 03/14/2016 0526   CO2 27 03/14/2016 0526   GLUCOSE 100 (H) 03/14/2016 0526   BUN 30 (H) 03/14/2016 0526   BUN 23 (A) 01/14/2016   CREATININE 0.62 03/14/2016 0526   CALCIUM 8.5 (L) 03/14/2016 0526   PROT 6.2 (L) 03/14/2016 0526   ALBUMIN 1.6 (L) 03/14/2016 0526   AST 45 (H) 03/14/2016 0526   ALT 40 03/14/2016 0526   ALKPHOS 203 (H) 03/14/2016 0526   BILITOT 0.2 (L) 03/14/2016 0526   GFRNONAA >60 03/14/2016 0526   GFRAA >60 03/14/2016 0526   Lipase     Component Value Date/Time   LIPASE 55 (H)  01/20/2016 0158       Studies/Results: No results found.  Anti-infectives: Anti-infectives    Start     Dose/Rate Route Frequency Ordered Stop   03/14/16 2000  erythromycin ethylsuccinate (EES) 200 MG/5ML suspension 200 mg    Comments:  Give via G tube   200 mg Per Tube Every 12 hours 03/14/16 0805     03/08/16 1730  erythromycin ethylsuccinate (EES) 200 MG/5ML suspension 200 mg  Status:  Discontinued     200 mg Per Tube Every 8 hours 03/08/16 1634 03/14/16 0805   02/29/16 2000  cefTRIAXone (ROCEPHIN) 2 g in dextrose 5 % 50 mL IVPB     2 g 100 mL/hr over 30 Minutes Intravenous Every 24 hours 02/29/16 1804 03/02/16 2017   02/27/16 2000  cefTRIAXone (ROCEPHIN) 2 g in dextrose 5 % 50 mL IVPB  Status:  Discontinued     2 g 100 mL/hr over 30 Minutes Intravenous Every 24 hours 02/26/16 2137 02/29/16 1804       Assessment/Plan  EC fistula - located at SB jejunal anastomosis - clinically improving, much smaller wound/pouch and lower output - continues to tolerate continuous TF's at19mL/hr; continue erythromycin  - perc drain removed 1/25 - continue only liquid medications via J tube and  no crushed meds - continue G-tube to gravity, appears to have increased output based on chart but it is clear and does not appear to be from tube feeds?  FEN - NPO except for ice chips, IVF, TPN, TF at night VTE - Lovenox   Plan - tolerating increased tube feeds and working with speech today, swallow eval. Pt looks and appears to be feeling better. Continue TF and TPN. We will continue to follow.     LOS: 19 days    Jerre SimonJessica L Lamaria Hildebrandt , [facility] 03/16/2016, 8:46 AM Pager: [phone] Consults: [phone] Mon-Fri 7:00 am-4:30 pm Sat-Sun 7:00 am-11:30 am

## 2016-03-17 ENCOUNTER — Inpatient Hospital Stay (HOSPITAL_COMMUNITY): Payer: Medicare Other | Admitting: Speech Pathology

## 2016-03-17 ENCOUNTER — Inpatient Hospital Stay (HOSPITAL_COMMUNITY): Payer: Medicare Other | Admitting: Occupational Therapy

## 2016-03-17 ENCOUNTER — Inpatient Hospital Stay (HOSPITAL_COMMUNITY): Payer: Medicare Other | Admitting: Physical Therapy

## 2016-03-17 DIAGNOSIS — T85528A Displacement of other gastrointestinal prosthetic devices, implants and grafts, initial encounter: Secondary | ICD-10-CM

## 2016-03-17 DIAGNOSIS — Z434 Encounter for attention to other artificial openings of digestive tract: Secondary | ICD-10-CM

## 2016-03-17 LAB — COMPREHENSIVE METABOLIC PANEL
ALBUMIN: 1.8 g/dL — AB (ref 3.5–5.0)
ALK PHOS: 167 U/L — AB (ref 38–126)
ALT: 52 U/L (ref 17–63)
AST: 54 U/L — ABNORMAL HIGH (ref 15–41)
Anion gap: 3 — ABNORMAL LOW (ref 5–15)
BUN: 27 mg/dL — AB (ref 6–20)
CALCIUM: 8.3 mg/dL — AB (ref 8.9–10.3)
CO2: 24 mmol/L (ref 22–32)
CREATININE: 0.68 mg/dL (ref 0.61–1.24)
Chloride: 108 mmol/L (ref 101–111)
GFR calc Af Amer: >60 mL/min (ref >60)
GFR calc non Af Amer: >60 mL/min (ref >60)
GLUCOSE: 97 mg/dL (ref 65–99)
Potassium: 4.3 mmol/L (ref 3.5–5.1)
SODIUM: 135 mmol/L (ref 135–145)
Total Bilirubin: 0.5 mg/dL (ref 0.3–1.2)
Total Protein: 6.2 g/dL — ABNORMAL LOW (ref 6.5–8.1)

## 2016-03-17 LAB — GLUCOSE, CAPILLARY
GLUCOSE-CAPILLARY: 113 mg/dL — AB (ref 65–99)
GLUCOSE-CAPILLARY: 103 mg/dL — AB (ref 65–99)
GLUCOSE-CAPILLARY: 92 mg/dL (ref 65–99)
Glucose-Capillary: 119 mg/dL — ABNORMAL HIGH (ref 65–99)
Glucose-Capillary: 142 mg/dL — ABNORMAL HIGH (ref 65–99)
Glucose-Capillary: 82 mg/dL (ref 65–99)

## 2016-03-17 LAB — CBC
HEMATOCRIT: 28.1 % — AB (ref 39.0–52.0)
Hemoglobin: 8.8 g/dL — ABNORMAL LOW (ref 13.0–17.0)
MCH: 28.7 pg (ref 26.0–34.0)
MCHC: 31.3 g/dL (ref 30.0–36.0)
MCV: 91.5 fL (ref 78.0–100.0)
Platelets: 344 10*3/uL (ref 150–400)
RBC: 3.07 MIL/uL — ABNORMAL LOW (ref 4.22–5.81)
RDW: 15.8 % — AB (ref 11.5–15.5)
WBC: 7.3 10*3/uL (ref 4.0–10.5)

## 2016-03-17 LAB — MAGNESIUM: Magnesium: 1.8 mg/dL (ref 1.7–2.4)

## 2016-03-17 LAB — PHOSPHORUS: Phosphorus: 3.8 mg/dL (ref 2.5–4.6)

## 2016-03-17 MED ORDER — LIDOCAINE-EPINEPHRINE 2 %-1:100000 IJ SOLN
20.0000 mL | Freq: Once | INTRAMUSCULAR | Status: DC
  Filled 2016-03-17: qty 20

## 2016-03-17 MED ORDER — LIDOCAINE HCL 2 % EX GEL
1.0000 "application " | Freq: Once | CUTANEOUS | Status: AC
  Administered 2016-03-17: 1 via TOPICAL
  Filled 2016-03-17: qty 5

## 2016-03-17 MED ORDER — SODIUM PHOSPHATES 15 MMOLE/5ML IV SOLN
INTRAVENOUS | Status: AC
  Administered 2016-03-17 (×2): via INTRAVENOUS
  Filled 2016-03-17: qty 1800

## 2016-03-17 MED ORDER — LIDOCAINE-EPINEPHRINE (PF) 2 %-1:200000 IJ SOLN
20.0000 mL | Freq: Once | INTRAMUSCULAR | Status: AC
  Administered 2016-03-17: 20 mL via INTRADERMAL
  Filled 2016-03-17: qty 20

## 2016-03-17 MED ORDER — ACETAMINOPHEN 10 MG/ML IV SOLN
1000.0000 mg | Freq: Once | INTRAVENOUS | Status: AC
  Administered 2016-03-17: 1000 mg via INTRAVENOUS
  Filled 2016-03-17: qty 100

## 2016-03-17 MED ORDER — MORPHINE SULFATE (PF) 4 MG/ML IV SOLN
2.0000 mg | INTRAVENOUS | Status: DC | PRN
  Filled 2016-03-17: qty 1

## 2016-03-17 MED ORDER — JEVITY 1.2 CAL PO LIQD
1000.0000 mL | ORAL | Status: DC
  Administered 2016-03-18 – 2016-03-24 (×7): 1000 mL
  Filled 2016-03-17 (×5): qty 1000
  Filled 2016-03-17: qty 237
  Filled 2016-03-17: qty 1000
  Filled 2016-03-17: qty 237
  Filled 2016-03-17: qty 1000
  Filled 2016-03-17 (×2): qty 237
  Filled 2016-03-17 (×2): qty 1000

## 2016-03-17 MED ORDER — FAT EMULSION 20 % IV EMUL
240.0000 mL | INTRAVENOUS | Status: AC
  Administered 2016-03-17: 240 mL via INTRAVENOUS
  Filled 2016-03-17: qty 250

## 2016-03-17 NOTE — Progress Notes (Signed)
[facility] Progress Note     Subjective: Pt feeling good. No new complaints. G tube stitch came out and G tube came out a few inches.   Objective: Vital signs in last 24 hours: Temp:  [97.5 F (36.4 C)-98 F (36.7 C)] 97.5 F (36.4 C) (02/01 0500) Pulse Rate:  [62-94] 62 (02/01 0500) Resp:  [18] 18 (02/01 0500) BP: (129-135)/(53-63) 135/53 (02/01 0500) SpO2:  [96 %-98 %] 98 % (02/01 0500) Weight:  [177 lb 14.4 oz (80.7 kg)] 177 lb 14.4 oz (80.7 kg) (02/01 0500) Last BM Date: 03/16/16  Intake/Output from previous day: 01/31 0701 - 02/01 0700 In: 30 [I.V.:30] Out: 1300 [Urine:700; Drains:600] Intake/Output this shift: No intake/output data recorded.  PE: Gen: Alert, NAD, pleasant, cooperative, lying in bed, well appearing Pulm: Rate and effort normal Abd: Soft, nondistended, EC fistula much smaller (see photo below). G tube advanced by Dr. Andrey CampanileWilson, site clean and dry.  Skin: no rashes noted, warm and dry, not diaphoretic      Lab Results:   Recent Labs  03/17/16 0536  WBC 7.3  HGB 8.8*  HCT 28.1*  PLT 344   BMET  Recent Labs  03/17/16 0536  NA 135  K 4.3  CL 108  CO2 24  GLUCOSE 97  BUN 27*  CREATININE 0.68  CALCIUM 8.3*   PT/INR No results for input(s): LABPROT, INR in the last 72 hours. CMP     Component Value Date/Time   NA 135 03/17/2016 0536   NA 139 01/14/2016   K 4.3 03/17/2016 0536   CL 108 03/17/2016 0536   CO2 24 03/17/2016 0536   GLUCOSE 97 03/17/2016 0536   BUN 27 (H) 03/17/2016 0536   BUN 23 (A) 01/14/2016   CREATININE 0.68 03/17/2016 0536   CALCIUM 8.3 (L) 03/17/2016 0536   PROT 6.2 (L) 03/17/2016 0536   ALBUMIN 1.8 (L) 03/17/2016 0536   AST 54 (H) 03/17/2016 0536   ALT 52 03/17/2016 0536   ALKPHOS 167 (H) 03/17/2016 0536   BILITOT 0.5 03/17/2016 0536   GFRNONAA >60 03/17/2016 0536   GFRAA >60 03/17/2016 0536   Lipase     Component Value Date/Time   LIPASE 55 (H) 01/20/2016 0158        Studies/Results: No results found.  Anti-infectives: Anti-infectives    Start     Dose/Rate Route Frequency Ordered Stop   03/16/16 2200  erythromycin ethylsuccinate (EES) 200 MG/5ML suspension 200 mg    Comments:  Give via G tube   200 mg Per Tube Every 8 hours 03/16/16 1746     03/14/16 2000  erythromycin ethylsuccinate (EES) 200 MG/5ML suspension 200 mg  Status:  Discontinued    Comments:  Give via G tube   200 mg Per Tube Every 12 hours 03/14/16 0805 03/16/16 1746   03/08/16 1730  erythromycin ethylsuccinate (EES) 200 MG/5ML suspension 200 mg  Status:  Discontinued     200 mg Per Tube Every 8 hours 03/08/16 1634 03/14/16 0805   02/29/16 2000  cefTRIAXone (ROCEPHIN) 2 g in dextrose 5 % 50 mL IVPB     2 g 100 mL/hr over 30 Minutes Intravenous Every 24 hours 02/29/16 1804 03/02/16 2017   02/27/16 2000  cefTRIAXone (ROCEPHIN) 2 g in dextrose 5 % 50 mL IVPB  Status:  Discontinued     2 g 100 mL/hr over 30 Minutes Intravenous Every 24 hours 02/26/16 2137 02/29/16 1804       Assessment/Plan  EC fistula -  located at [facility] jejunal anastomosis - clinically improving, much smaller wound/pouch and lower output - continues to tolerate continuous TF's at52mL/hr; continue erythromycin  - perc drain removed 1/25 - continue only liquid medications via J tube and no crushed meds - continue G-tube to gravity, appears to have increased output based on chart but it is clear and does not appear to be from tube feeds?  FEN - NPO except for ice chips, IVF, TPN, TF at night VTE - Lovenox   Plan - tolerating increased tube feeds, now at 2mL/hr with a goal of 25mL/hr for 20 hours. Continue TPN until close to the TF goal. Inpatient rehab stated pt was to be discharged today.  G tube came out some. Before tube was advanced, baloon was checked and it was found to be nonfunction. I sutured the Gtube securely to the skin and the GJ tube will be replaced by IR tomorrow. HOLD TUBE FEEDS  pending tube replacement tomorrow.        LOS: 20 days    Jerre Simon , [facility] 03/17/2016, 9:34 AM Pager: [phone] Consults: [phone] Mon-Fri 7:00 am-4:30 pm Sat-Sun 7:00 am-11:30 am

## 2016-03-17 NOTE — Progress Notes (Signed)
Occupational Therapy Note  Patient Details  Name: [name] MRN: [id number] Date of Birth: 05/09/1940  Pt missed 90 mins scheduled OT treatment session secondary to on hold due to G-tube not secure.  Awaiting RN to suture and clear pt for therapy.  [name], [name] 03/17/2016, 10:12 AM

## 2016-03-17 NOTE — Plan of Care (Signed)
Problem: RH Grooming Goal: LTG Patient will perform grooming w/assist,cues/equip (OT) LTG: Patient will perform grooming with assist, with/without cues using equipment (OT)  Upgraded goal 2/1 ESD

## 2016-03-17 NOTE — Progress Notes (Signed)
Reported by HS nurse TPN was increased to 170 ml/h after a hour, then decreased to 50 ml/h one hour before it was disconnected. Will continue to monitor patient.[place]

## 2016-03-17 NOTE — Progress Notes (Signed)
[place] PHYSICAL MEDICINE & REHABILITATION     PROGRESS NOTE  Subjective/Complaints: Patient seen lying in bed this morning. Patient states she slept well overnight. Informed by nursing student about leakage around J-tube. Spoke to nursing regarding tube feed residual.  ROS: Denies vomiting, diarrhea, shortness of breath or chest pain.  Objective: Vital Signs: Blood pressure (!) 135/53, pulse 62, temperature 97.5 F (36.4 C), temperature source Oral, resp. rate 18, height 6\' 2"  (1.88 m), weight 80.7 kg (177 lb 14.4 oz), SpO2 98 %. No results found.  Recent Labs  03/17/16 0536  WBC 7.3  HGB 8.8*  HCT 28.1*  PLT 344    Recent Labs  03/17/16 0536  NA 135  K 4.3  CL 108  GLUCOSE 97  BUN 27*  CREATININE 0.68  CALCIUM 8.3*   CBG (last 3)   Recent Labs  03/17/16 0415 03/17/16 0759 03/17/16 1148  GLUCAP 113* 103* 92    Wt Readings from Last 3 Encounters:  03/17/16 80.7 kg (177 lb 14.4 oz)  02/26/16 78.6 kg (173 lb 4.5 oz)  01/19/16 79.2 kg (174 lb 8 oz)    Physical Exam:  BP (!) 135/53 (BP Location: Left Arm)   Pulse 62   Temp 97.5 F (36.4 C) (Oral)   Resp 18   Ht 6\' 2"  (1.88 m)   Wt 80.7 kg (177 lb 14.4 oz)   SpO2 98%   BMI 22.84 kg/m  Constitutional: NAD. Vital signs reviewed. Frail.  HENT: Normocephalic and atraumatic. +Stoma. Eyes: EOMI. No discharge.  Cardiovascular: RRR. No JVD. Respiratory: CTA bilaterally. Unlabored.  GI: He exhibits no distension. BS+. +Eakins pouch. Leakage around J-tube, appears dislodged. Musculoskeletal: He exhibits no edema. He exhibits no tenderness.  Neurological: He is alert.  Sitting balance is good Speech with dysarthria Motor: 4/5 throughout (Stable) Skin: Skin is warm and dry. Abdominal wounds with dressing.   Assessment/Plan: 1. Functional deficits secondary to debility and encephalopathy which require 3+ hours per day of interdisciplinary therapy in a comprehensive inpatient rehab setting. Physiatrist is  providing close team supervision and 24 hour management of active medical problems listed below. Physiatrist and rehab team continue to assess barriers to discharge/monitor patient progress toward functional and medical goals.  Function:  Bathing Bathing position Bathing activity did not occur: Refused Position: Wheelchair/chair at sink  Bathing parts Body parts bathed by patient: Right arm, Left arm, Chest, Front perineal area, Right upper leg, Right lower leg, Left upper leg, Left lower leg Body parts bathed by helper: Buttocks  Bathing assist Assist Level: Touching or steadying assistance(Pt > 75%), Set up   Set up : To obtain items, To open containers, To adjust water temperature  Upper Body Dressing/Undressing Upper body dressing Upper body dressing/undressing activity did not occur: Safety/medical concerns What is the patient wearing?: Pull over shirt/dress     Pull over shirt/dress - Perfomed by patient: Thread/unthread right sleeve, Thread/unthread left sleeve, Put head through opening Pull over shirt/dress - Perfomed by helper: Pull shirt over trunk        Upper body assist Assist Level: Touching or steadying assistance(Pt > 75%)      Lower Body Dressing/Undressing Lower body dressing Lower body dressing/undressing activity did not occur: Safety/medical concerns What is the patient wearing?: Pants, Non-skid slipper socks     Pants- Performed by patient: Thread/unthread left pants leg, Thread/unthread right pants leg Pants- Performed by helper: Pull pants up/down Non-skid slipper socks- Performed by patient: Don/doff right sock Non-skid slipper socks- Performed  by helper: Don/doff left sock, Don/doff right sock   Socks - Performed by helper: Don/doff right sock, Don/doff left sock   Shoes - Performed by helper: Don/doff right shoe, Don/doff left shoe, Fasten right, Fasten left       TED Hose - Performed by helper: Don/doff right TED hose, Don/doff left TED hose   Lower body assist Assist for lower body dressing:  (Mod A)      Toileting Toileting Toileting activity did not occur: No continent bowel/bladder event Toileting steps completed by patient: Adjust clothing prior to toileting Toileting steps completed by helper: Performs perineal hygiene, Adjust clothing after toileting Toileting Assistive Devices: Grab bar or rail  Toileting assist Assist level: Touching or steadying assistance (Pt.75%)   Transfers Chair/bed transfer   Chair/bed transfer method: Ambulatory Chair/bed transfer assist level: Touching or steadying assistance (Pt > 75%) Chair/bed transfer assistive device: Armrests, Patent attorney     Max distance: 100 Assist level: Touching or steadying assistance (Pt > 75%)   Wheelchair   Type: Manual Max wheelchair distance: 40  Assist Level: Supervision or verbal cues  Cognition Comprehension Comprehension assist level: Follows basic conversation/direction with extra time/assistive device  Expression Expression assist level: Expresses basic 75 - 89% of the time/requires cueing 10 - 24% of the time. Needs helper to occlude trach/needs to repeat words.  Social Interaction Social Interaction assist level: Interacts appropriately with others with medication or extra time (anti-anxiety, antidepressant).  Problem Solving Problem solving assist level: Solves basic 75 - 89% of the time/requires cueing 10 - 24% of the time  Memory Memory assist level: Recognizes or recalls 75 - 89% of the time/requires cueing 10 - 24% of the time    Medical Problem List and Plan: 1. Debility and encephalopathy secondary to small bowel obstruction and associated complications   Cont CIR   Surgery evaluated, TFs per Surgery, tolerating  Cont NPO   No crushed meds through feeding tube  Leakage around J-tube today. Will speak to GI/Surg. 2. DVT Prophylaxis/Anticoagulation: Pharmaceutical: Lovenox  3. Pain Management: IV morphine every  4 hours prn severe pain  4. Mood: team to provide ego support. LCSW to follow for evaluation and support.   Prozac started 1/18 5. Neuropsych: This patient is not fully capable of making decisions on his own behalf.  6. Skin/Wound Care: Continue air mattress overlay. Continue large Eakin's pouch to manage wound drainage. WOC for management. Encourage side lying to help manage MASD.  7. Malnutrition/Fluids/Electrolytes/Nutrition: continue TPN.   Albumin continues to improve 8. Abdominal abscess: Leucocytosis resolving. Completed rocephin 1/17 9. Anemia of chronic illness:   Hb 8.8 on 2/1  Cont to monitor 10. Electrolyte abnormality/Acute renal insufficiency: Managed by TNA   Mag continues to improve on 2/1 11. Encephalopathy: Changed haldol to IM prn for management of agitation/anxiety.  12. Hyperglycemia  Due to TPN  Overall controlled 2/1 13. Leukocytosis: Resolved  WBC 7.3 on 2/1  Afebrile  Ucx NG 14. Orthostasis: resolved 15. Sleep disturbance  Has tried Haldol without benefit, ativan causes hallucinations  Continue Xanax elixer, no meds per tube  Improved 16. Mild hyponatremia:   Na+ 135 on 2/1  LOS (Days) 20 A FACE TO FACE EVALUATION WAS PERFORMED  [name] 03/17/2016 12:35 PM

## 2016-03-17 NOTE — Progress Notes (Signed)
Speech Language Pathology Note  Patient Details  Name: [name] MRN: [id number] Date of Birth: 1940-04-25 Today's Date: 03/17/2016  Patient missed 30 minutes of skilled SLP intervention due to being on medical hold. Patient declined cognitive therapy while upright in bed due to having a "rough morning."    Alfredo Collymore 03/17/2016, 4:03 PM

## 2016-03-17 NOTE — Progress Notes (Addendum)
Physical Therapy Note  Patient Details  Name: [name] MRN: [id number] Date of Birth: March 08, 1940 Today's Date: 03/17/2016  Patient missed 60 min skilled physical therapy due to medical hold (J tube displaced). Verbal order received from PA Pam Love to hold PM therapies as well. Will f/u as able.   Norie Latendresse, Prudencio PairRebecca A 03/17/2016, 12:43 PM

## 2016-03-17 NOTE — Progress Notes (Signed)
This am noted leakage from around the j-tube, made Allena KatzPatel, MD aware. No new orders given at this time. Approximately 15 minutes later student nurse, Lucrezia Europeoyce noted tube was dislodged. J-tube was stabilized to abdomen and thigh with tape. Notified Marissa NestlePam Love, PA and general surgery was notified as well.  Patient resting in bed with no complaints of pain/discomfort at this time. Will continue to monitor patient. [place]

## 2016-03-17 NOTE — Patient Care Conference (Signed)
Inpatient RehabilitationTeam Conference and Plan of Care Update Date: 03/16/2016   Time: 2:00 PM    Patient Name: [name]      Medical Record Number: [id number]  Date of Birth: [birth year] Sex: Male         Room/Bed: 4W06C/4W06C-01 Payor Info: Payor: [employer]ARE MEDICARE / Plan: UHC MEDICARE / Product Type: *No Product type* /    Admitting Diagnosis: Debility  Admit Date/Time:  02/26/2016  8:56 PM Admission Comments: No comment available   Primary Diagnosis:  Debility Principal Problem: Debility  Patient Active Problem List   Diagnosis Date Noted  . Gastric dysmotility   . Post-operative state   . Hypoalbuminemia due to protein-calorie malnutrition (HCC)   . Gastrojejunostomy tube 02/06/2016 03/10/2016  . Jejunal anastomotic leak 03/10/2016  . Hyponatremia   . Anxiety and depression   . Sleep disturbance   . Orthostasis   . Leukocytosis   . Hyperglycemia   . FTT (failure to thrive) in adult   . Post-operative pain   . 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 Anemia of chronic disease 01/31/2016  . Stage 2 skin ulcer of sacral region 01/31/2016  . Hypomagnesemia 01/21/2016  . Hypokalemia 01/21/2016  . Pressure injury of skin 01/20/2016  . Pleural effusion   . Gastroesophageal reflux disease 01/19/2016  . Insomnia 01/19/2016  . On total parenteral nutrition (TPN) 01/17/2016  . Metabolic acidosis 01/17/2016  . Skin ulcer of abdominal wall, limited to breakdown of skin (HCC) 01/17/2016  . Dysphagia 01/17/2016  . Debility 01/17/2016  . 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 Vitamin B12 deficiency 01/02/2016  . BPH (benign prostatic hyperplasia)   . Posterior vitreous detachment   . Salzmann's nodular dystrophy of left eye   . Hyperopia of both eyes with astigmatism and presbyopia   . Central pterygium of right eye   . Pressure ulcer of buttock, right, unstageable (HCC) 12/30/2015  . Hypernatremia 12/28/2015  .  Tobacco abuse 12/09/2015  . Gastritis 12/09/2015  . Acute respiratory failure with hypoxemia (HCC)   . Protein-calorie malnutrition, severe 12/04/2015  . Encephalopathy acute 12/03/2015  . Anxiety state 09/28/2015  . Hyperlipidemia   . 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 Coronary atherosclerosis of native coronary artery 04/24/2013  . Essential hypertension, benign 04/24/2013  . Other and unspecified hyperlipidemia 04/24/2013  . Central pterygium 10/30/2012  . 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 Cataract, nuclear sclerotic senile 10/30/2012    Expected Discharge Date: Expected Discharge Date: 03/17/16  Team Members Present: Physician leading conference: Dr. Maryla MorrowAnkit [name] Social Worker Present: Dossie DerBecky Humza Tallerico, LCSW Nurse Present: Kennyth ArnoldStacey Jennings, RN PT Present: Bayard Huggerebecca Varner, PT OT Present: Kearney HardElisabeth Doe, OT SLP Present:  Othella Boyer(Happi Overton-SP) PPS Coordinator present : Tora DuckMarie Noel, RN, CRRN     Current Status/Progress Goal Weekly Team Focus  Medical   Debility and encephalopathy secondary to small bowel obstruction and associated complications   Improve nutrition, endurance, strength  See above   Bowel/Bladder     Timed tolieting and encouraging to use call bell   Incont B & B     Swallow/Nutrition/ Hydration   Remains NPO with JG tube, ice chip protocol initiated  Min A when consuming thin liquids -   Timely swallow initiation with ice chips or 1/2 tsp room temp water   ADL's   Min A overall  Supervision/Min A overall  activity tolerance, standing endurance ,functional transfers, modified bathing/dressing  Mobility   min-mod A overall. Capable of ambulating moderare distances with RW close supervision. deconditioning continues to be a barrier for pt. Will reassess if treatment for BPPV helped with dizziness  Supervision-Min A overall with bed mobility being Mod I  Activity tolerance, bed  mobility, gait, general strengthening, upright tolerance,    Communication   Mod A  supervision  addressing increased vocal intensity, overarticulation and slow rate for increased intelligibility   Safety/Cognition/ Behavioral Observations  Mod A  Min A  recall of daily information, attention to tasks, awareness of deficits, semi-complex problem solving   Pain        less than 3 monitoring     Skin        monitor at time no skin issues.        *See Care Plan and progress notes for long and short-term goals.  Barriers to Discharge: Sleep, fistula, pouch, cognition, TPN, tube feeds     Possible Resolutions to Barriers:  Advance tube feeds as tolerated, cont TPN, follow labs, therapies    Discharge Planning/Teaching Needs:  Family feels pt is too medically complex to try to take him home and want to pursue short term NHP. Will begin NH process and await medical stability       Team Discussion:  Progressing toward his goals, medically progressing with tube feeds. Eakins pouch changed M&Th, less drainage form this. Takes encouragement to push himself. NPO-Speech working on swallowing. Too much care medically for the family to take him home at this time-plan on short tern NHP. Surgery following and recommending increase in tube feedings. MD feels medically stable to look for NH bed  Revisions to Treatment Plan:  Changed to NHP   Continued Need for Acute Rehabilitation Level of Care: The patient requires daily medical management by a physician with specialized training in physical medicine and rehabilitation for the following conditions: Daily direction of a multidisciplinary physical rehabilitation program to ensure safe treatment while eliciting the highest outcome that is of practical value to the patient.: Yes Daily medical management of patient stability for increased activity during participation in an intensive rehabilitation regime.: Yes Daily analysis of laboratory values and/or  radiology reports with any subsequent need for medication adjustment of medical intervention for : Nutritional problems;Post surgical problems;Wound care problems;Mood/behavior problems  Lucy Chris 03/17/2016, 8:30 AM

## 2016-03-17 NOTE — Progress Notes (Signed)
Social Work Patient ID: [name], male   DOB: 01-15-1941, 76 y.o.   MRN: [id number] Spoke with [facility] regarding bed, she reports some of her residents are in the hospital with the flu and she will not have a bed At this time. Wife and daughter aware and will plan to look at other facilities in network. Spoke with [facility] medical issue this am and contacting surgery to come in this am. Will work on NH bed and await medical stability.

## 2016-03-17 NOTE — Progress Notes (Signed)
Occupational Therapy Note  Patient Details  Name: [name] MRN: [id number] Date of Birth: [birth year]  Today's Date: 03/17/2016 OT Missed Time: 30 Minutes Missed Time Reason: MD hold (comment)  Patient missed 30 min skilled occupational therapy due to medical hold (G tube displaced). Will continue OT once pt is cleared to resume therapy.   Merlene Laughterlisabeth S Emarion Toral 03/17/2016, 1:05 PM

## 2016-03-17 NOTE — Progress Notes (Signed)
PHARMACY - TOTAL PARENTERAL NUTRITION CONSULT NOTE   Pharmacy Consult:  TPN Indication:  SBO and new EC fistula 02/13/16  Patient Measurements: Height: _0  (188 cm) Weight: 177 lb 14.4 oz (80.7 kg) IBW/kg (Calculated) : 82.2   Body mass index is 22.84 kg/m.  Assessment:  64 YOM with history of multiple procedures and extended hospitalization for recurrent partial GOO 2/2 pyloric stricture.  Patient was on TPN for several days, then discharged on 12/29/16 with J/G tube in place for TF. He was unable to tolerate TF and was transitioned back to TPN by [facility] on 01/05/17.  Patient was readmitted on 01/19/17 with partial SBO.  Patient failed MBS on 01/26/16 and did not tolerate bolus feeds through G-tube on 01/27/16. On 12/23, he underwent ex-lap with LoA, segmental SBR, and gastrojejunostomy. On 02/12/16, TF were found draining from midline incision with CT abd showing bowel perforation at [place] site and development of abscesses.   GI: EC fistula at SB jejunal anastomosis >> much smaller. Prealbumin WNL at 24>26.7. Drain O/P 975m. Erythromycin started 1/23, Prilosec, having BM. Nauseous when TF rate advanced previously. Compazine PRN. Failed 11/26 MBS. Tolerating TF at 20 ml/h x several days - increased to 30 ml/hr overnight and did not tolerate. To confirm tube placement this AM and resume at previous rate per Surgery Endo: no hx DM - CBGs adequately controlled (CBGs 85-89 off TPN, 97-128 on TPN) Insulin requirements in the past 24 hours:  SSI d/c'd 1/14 Lytes: coCa~10  Renal: SCr 0.68 stable, CrCL ~85-90 ml/min - UOP 0.4 ml/kg/hr Pulm: stable on RA - nicotine patch Cards: orthostatic hypotension improved - VSS Hepatobil: AST 54, ALT wnl, alk phos down 167. TG WNL. Neuro: Prozac, Xanax, lidocaine patch, PRN morphine, APAP TID Heme/Onc: S/p Iron dextran ID: s/p CTX 1/13 > 1/17 for UTI, UCx negative - afebrile, WBC WNL Best Practices: Lovenox, MC TPN Access: PICC TPN start date: [facility] > [facility]  01/20/16 > [facility] 02/26/15  Home TPN: 2156 kCal and 75gm protein per day (405g CHO = 1376 kCal, 50g lipid = 480 kCal)  Nutritional Goals: 2100 - 2300 kCal and 95 - 110 g protein per day  Current Nutrition:  Jevity 1.2 at 20 ml/hr for 16 hrs (384 kCal, 18 gm protein) Clinimix E 5/15 + lipids (1758 kCal, 90g protein) NPO except ice chips   Plan:  - Per Surgery, plan today is to resume Jevity 1.2 at 20 ml/hr over 16 hours (1400 to 0600) - Cycle Clinimix E 5/15, infuse 1800 mls over 12 hrs:  50 ml/hr x 1 hr, then 170 ml/hr x 10 hrs, then 50 mL/hr x 1 hr. Continue lipids 20 ml/hr x 12 hrs - Clinimix and TF at 15 ml/hr will provide 2142 kCal and 108 gm protein per day, meeting ~100% of patient's needs. - Daily multivitamin in TPN (liquid multivitamin not available) - Trace elements every other day due to med on critical shortage (next 2/2) - F/U with TF tolerance to advance, ability to wean TPN - Standard TPN labs on Mon and Thurs - F/u d/c plan - possible transition to NH soon   HElicia Lamp PharmD, BCPS Clinical Pharmacist 03/17/2016 9:29 AM

## 2016-03-17 NOTE — Consult Note (Signed)
WOC Nurse wound follow up Wound type: open surgical wound with EC fistula centrally Measurement:3cm x 1.5cm x 0.1cm with beginning of somatized fistula Wound ZOX:WRUEAbed:beefy red, hypergranulation tissue Drainage (amount, consistency, odor) minimal, see nursing flow sheets, thin bowel contents Periwound:intact and stable Dressing procedure/placement/frequency: Old Eakin's pouch removed without difficulty, much less painful for patient with smaller Eakin pouch Clean site and surrounding skin, allowed to air dry. Lined wound edges with ostomy barrier ring Used Eakins material to fill long healed surgical wound distal to open area to flatten for pouch placement Apply new pouch, pattern left and taped to the wall for use by bedside nursing staff if needed Covered distal edge of the old wound/over the pectin strips with small silicone foam Secured edges of the fistula pouch with hypafix tape. Planned for Q wk (Thursday) changes as long as pouch continues to work well for this patient.  WOC Nurse team will follow along with you for weekly wound assessments.  Please notify me of any acute changes in the wounds or any new areas of concerns Summer Parthasarathy [facility] MSN, RN,CWOCN, CNS [phone]

## 2016-03-18 ENCOUNTER — Inpatient Hospital Stay (HOSPITAL_COMMUNITY): Payer: Medicare Other | Admitting: Occupational Therapy

## 2016-03-18 ENCOUNTER — Inpatient Hospital Stay (HOSPITAL_COMMUNITY): Payer: Medicare Other | Admitting: Physical Therapy

## 2016-03-18 ENCOUNTER — Inpatient Hospital Stay (HOSPITAL_COMMUNITY): Payer: Medicare Other

## 2016-03-18 ENCOUNTER — Inpatient Hospital Stay (HOSPITAL_COMMUNITY): Payer: Medicare Other | Admitting: Speech Pathology

## 2016-03-18 ENCOUNTER — Encounter (HOSPITAL_COMMUNITY): Payer: Self-pay | Admitting: Interventional Radiology

## 2016-03-18 HISTORY — PX: IR GENERIC HISTORICAL: IMG1180011

## 2016-03-18 LAB — GLUCOSE, CAPILLARY
Glucose-Capillary: 127 mg/dL — ABNORMAL HIGH (ref 65–99)
Glucose-Capillary: 140 mg/dL — ABNORMAL HIGH (ref 65–99)
Glucose-Capillary: 147 mg/dL — ABNORMAL HIGH (ref 65–99)
Glucose-Capillary: 85 mg/dL (ref 65–99)
Glucose-Capillary: 99 mg/dL (ref 65–99)
Glucose-Capillary: 99 mg/dL (ref 65–99)

## 2016-03-18 MED ORDER — TRACE MINERALS CR-CU-MN-SE-ZN 10-1000-500-60 MCG/ML IV SOLN
INTRAVENOUS | Status: DC
  Filled 2016-03-18 (×2): qty 1800

## 2016-03-18 MED ORDER — TRACE MINERALS CR-CU-MN-SE-ZN 10-1000-500-60 MCG/ML IV SOLN
INTRAVENOUS | Status: AC
  Administered 2016-03-18: 18:00:00 via INTRAVENOUS
  Filled 2016-03-18 (×2): qty 1800

## 2016-03-18 MED ORDER — LIDOCAINE VISCOUS 2 % MT SOLN
OROMUCOSAL | Status: AC
  Filled 2016-03-18: qty 15

## 2016-03-18 MED ORDER — IOPAMIDOL (ISOVUE-300) INJECTION 61%
INTRAVENOUS | Status: AC
  Administered 2016-03-18: 20 mL
  Filled 2016-03-18: qty 50

## 2016-03-18 MED ORDER — FAT EMULSION 20 % IV EMUL
240.0000 mL | INTRAVENOUS | Status: AC
  Administered 2016-03-18: 240 mL via INTRAVENOUS
  Filled 2016-03-18: qty 250

## 2016-03-18 MED ORDER — FAT EMULSION 20 % IV EMUL
240.0000 mL | INTRAVENOUS | Status: DC
  Filled 2016-03-18: qty 250

## 2016-03-18 MED ORDER — IOPAMIDOL (ISOVUE-300) INJECTION 61%
INTRAVENOUS | Status: AC
  Filled 2016-03-18: qty 50

## 2016-03-18 NOTE — Progress Notes (Signed)
PHARMACY - TOTAL PARENTERAL NUTRITION CONSULT NOTE   Pharmacy Consult:  TPN Indication:  SBO and new EC fistula 02/13/16  Patient Measurements: Height: 6\' 2"  (188 cm) Weight: 178 lb 9.2 oz (81 kg) IBW/kg (Calculated) : 82.2   Body mass index is 22.93 kg/m.  Assessment:  [age] YOM with history of multiple procedures and extended hospitalization for recurrent partial GOO 2/2 pyloric stricture.  Patient was on TPN for several days, then discharged on 12/29/16 with J/G tube in place for TF. He was unable to tolerate TF and [facility]  Patient was readmitted on 01/19/17 with partial SBO.  Patient failed MBS on 01/26/16 and did not tolerate bolus feeds through G-tube on 01/27/16. On 12/23, he underwent ex-lap with LoA, segmental SBR, and gastrojejunostomy. On 02/12/16, TF were found draining from midline incision with CT abd showing bowel perforation at GJ site and development of abscesses.   GI: EC fistula at SB jejunal anastomosis >> much smaller. Prealbumin WNL at 24>26.7. Drain O/P 1.1 L / 24 hr. Erythromycin started 1/23, Prilosec, having BM. Nauseous when TF rate advanced previously. Compazine PRN. Pt was tolerating TF at 20 ml/h x several days  2/1: Gtube dislodged, planning to formally replace JG tube in IR today, all tube feeds were held 2/1-2/2 Endo: no hx DM - CBGs 90-150 no ssi on board Lytes: coCa~10, lytes wnl Renal: SCr 0.68 stable, uop 0.4 ml/kg/hr Pulm: stable on RA - nicotine patch Cards: orthostatic hypotension improved - VSS Hepatobil: AST 54, ALT wnl, Tbili wnl, TG WNL Neuro: Prozac, Xanax, lidocaine patch, PRN morphine, APAP TID Heme/Onc: S/p Iron dextran ID: s/p CTX 1/13 > 1/17 for UTI, UCx negative - afebrile, WBC WNL Best Practices: Lovenox, MC TPN Access: PICC TPN start date: [facility] > [facility] 01/20/16 > [facility] 02/26/15  Home TPN: 2156 kCal and 75gm protein per day (405g CHO = 1376 kCal, 50g lipid = 480 kCal)  Nutritional Goals: 2100 -  2300 kCal and 95 - 110 g protein per day  Current Nutrition:  Clinimix E 5/15 + lipids (1758 kCal, 90g protein) NPO except ice chips   Plan:  - Cycle Clinimix E 5/15, infuse 1800 mls over 12 hrs:  50 ml/hr x 1 hr, then 170 ml/hr x 10 hrs, then 50 mL/hr x 1 hr. Continue lipids 20 ml/hr x 12 hrs - Clinimix + lipids will provide 1758 kCal and 90 gm protein per day, meeting 83% of kcal goal and 94% of protein goal - Will not advance rate, with the assumption that JG tube will be replaced today and TF will be started later - Daily multivitamin in TPN  - Trace elements every other day due to med on critical shortage (next 2/2) - Standard TPN labs on Mon and Thurs - F/u TF after JG tube replacement - F/u discharge plans     Agapito GamesAlison Tasmia Blumer, PharmD, BCPS Clinical Pharmacist 03/18/2016 8:32 AM

## 2016-03-18 NOTE — Progress Notes (Signed)
[facility] Progress Note     Subjective: Pt feel well. No complaints.   Objective: Vital signs in last 24 hours: Temp:  [97.8 F (36.6 C)-98.2 F (36.8 C)] 98.2 F (36.8 C) (02/02 1414) Pulse Rate:  [74-77] 74 (02/02 1414) Resp:  [18] 18 (02/02 1414) BP: (124-133)/(57-76) 133/57 (02/02 1414) SpO2:  [95 %-96 %] 96 % (02/02 1414) Weight:  [178 lb 9.2 oz (81 kg)] 178 lb 9.2 oz (81 kg) (02/02 0502) Last BM Date: 03/18/16  Intake/Output from previous day: 02/01 0701 - 02/02 0700 In: 30 [I.V.:30] Out: 3275 [Urine:2100; Drains:1175] Intake/Output this shift: Total I/O In: 0  Out: 1050 [Drains:525; Other:525]  PE: Gen: Alert, NAD, pleasant, cooperative, lying in bed, well appearing Pulm: Rate and effort normal Abd: Soft, nondistended, +BS, EC fistula much smaller with scant discharge in bad. J portion of G tube with slow leak Skin: no rashes noted, warm and dry, not diaphoretic  Lab Results:   Recent Labs  03/17/16 0536  WBC 7.3  HGB 8.8*  HCT 28.1*  PLT 344   BMET  Recent Labs  03/17/16 0536  NA 135  K 4.3  CL 108  CO2 24  GLUCOSE 97  BUN 27*  CREATININE 0.68  CALCIUM 8.3*   PT/INR No results for input(s): LABPROT, INR in the last 72 hours. CMP     Component Value Date/Time   NA 135 03/17/2016 0536   NA 139 01/14/2016   K 4.3 03/17/2016 0536   CL 108 03/17/2016 0536   CO2 24 03/17/2016 0536   GLUCOSE 97 03/17/2016 0536   BUN 27 (H) 03/17/2016 0536   BUN 23 (A) 01/14/2016   CREATININE 0.68 03/17/2016 0536   CALCIUM 8.3 (L) 03/17/2016 0536   PROT 6.2 (L) 03/17/2016 0536   ALBUMIN 1.8 (L) 03/17/2016 0536   AST 54 (H) 03/17/2016 0536   ALT 52 03/17/2016 0536   ALKPHOS 167 (H) 03/17/2016 0536   BILITOT 0.5 03/17/2016 0536   GFRNONAA >60 03/17/2016 0536   GFRAA >60 03/17/2016 0536   Lipase     Component Value Date/Time   LIPASE 55 (H) 01/20/2016 0158       Studies/Results: Ir Cm Inj Any Colonic Tube W/fluoro  Result  Date: 03/18/2016 INDICATION: History of gastrojejunostomy catheter, now with concern for catheter dislodgement. EXAM: FLUOROSCOPIC GUIDED GASTROJEJUNOSTOMY TUBE INJECTION COMPARISON:  Small bowel series - 03/01/2016 MEDICATIONS: None. CONTRAST:  20 cc Isovue 300, administered via both the gastric and jejunal lumens FLUOROSCOPY TIME:  36 seconds (1 mGy) COMPLICATIONS: None. PROCEDURE: The patient was positioned supine on the fluoroscopy table. A preprocedural spot fluoroscopic image was obtained. Contrast injection of the gastric lumen demonstrates brisk opacification of the stomach lumen. Contrast injection of the gym lumen demonstrates brisk opacification of the small bowel. No evidence of catheter kink, fracture or contrast extravasation. A dressing was placed. The patient tolerated procedure well without immediate postprocedural complication. IMPRESSION: Appropriate positioned and functioning gastrojejunostomy tube. No exchange performed. Electronically Signed   By: Simonne Come M.D.   On: 03/18/2016 13:02    Anti-infectives: Anti-infectives    Start     Dose/Rate Route Frequency Ordered Stop   03/16/16 2200  erythromycin ethylsuccinate (EES) 200 MG/5ML suspension 200 mg    Comments:  Give via G tube   200 mg Per Tube Every 8 hours 03/16/16 1746     03/14/16 2000  erythromycin ethylsuccinate (EES) 200 MG/5ML suspension 200 mg  Status:  Discontinued    Comments:  Give via G tube   200 mg Per Tube Every 12 hours 03/14/16 0805 03/16/16 1746   03/08/16 1730  erythromycin ethylsuccinate (EES) 200 MG/5ML suspension 200 mg  Status:  Discontinued     200 mg Per Tube Every 8 hours 03/08/16 1634 03/14/16 0805   02/29/16 2000  cefTRIAXone (ROCEPHIN) 2 g in dextrose 5 % 50 mL IVPB     2 g 100 mL/hr over 30 Minutes Intravenous Every 24 hours 02/29/16 1804 03/02/16 2017   02/27/16 2000  cefTRIAXone (ROCEPHIN) 2 g in dextrose 5 % 50 mL IVPB  Status:  Discontinued     2 g 100 mL/hr over 30 Minutes Intravenous  Every 24 hours 02/26/16 2137 02/29/16 1804       Assessment/Plan  EC fistula - located at SB jejunal anastomosis - clinically improving, much smaller wound/pouch and lower output - continues to tolerate continuous TF's at2930mL/hr - perc drain removed 1/25 - continue only liquid medications via J tube and no crushed meds - continue G-tube to gravity, appears to have increased output based on chart but it is clear and does not appear to be from tube feeds?  FEN - NPO except for ice chips, IVF, TPN, TF at night VTE - Lovenox   Plan - tolerating increased tube feeds, now at 2230mL/hr with a goal of 6665mL/hr for 20 hours. Continue TPN until close to the TF goal. Inpatient rehab stated pt was to be discharged today.  Yesterday G tube came out some. Before tube was advanced, baloon was checked and it was found to be nonfunction. Gtube was secured to the skin with a suture and the GJ tube will be replaced by IR today or monday. I spoke with Dr. Grace IsaacWatts today who did the drain study and was unaware that the tube needed to be replaced. He states he will try to replace today and if not then on Monday. Thank you Dr. Grace IsaacWatts for your time and help with this patient.   HOLD TUBE FEEDS pending tube replacement today if it doesn't happen today then start tube feeds at 5pm tonight.   LOS: 21 days    Jerre SimonJessica L Focht , [facility] 03/18/2016, 2:50 PM Pager: [phone] Consults: [phone] Mon-Fri 7:00 am-4:30 pm Sat-Sun 7:00 am-11:30 am

## 2016-03-18 NOTE — Procedures (Signed)
Appropriately positioned and functioning GJ tube. No exchange performed.  Danny [name], MD Pager #: [phone]

## 2016-03-18 NOTE — Progress Notes (Signed)
Occupational Therapy Weekly Progress Note  Patient Details  Name: [name] MRN: [id number] Date of Birth: May 11, 1940  Beginning of progress report period: February 27, 2016 End of progress report period: March 18, 2016  Today's Date: 03/18/2016 OT Individual Time: 3335-4562 OT Individual Time Calculation (min): 60 min   Patient has met 3 of 3 short term goals. Overall, pt has been making steady progress with OT treatments until placed on medical hold yesterday for issues with GJ tube- pt limited to therapy In the room today and required more assistance and longer rest breaks to recover from activity. Prior to medical hold,  Pt  was able to complete transfers with use of the RW and Min A, and Min A for bathing/dressing. Hopeful pt will quickly recover functional gains once cleared for more activity.  Will continue with current OT treatment POC.    Patient continues to demonstrate the following deficits: muscle weakness and decreased sitting balance, decreased standing balance, decreased postural control and decreased activity tolerance. and therefore will continue to benefit from skilled OT intervention to enhance overall performance with BADL and Reduce care partner burden.  Patient progressing toward long term goals..  Continue plan of care.  OT Short Term Goals Week 3:  OT Short Term Goal 1 (Week 3): Pt will tolerate 5 minutes of standing in preparation for ADL tasks OT Short Term Goal 1 - Progress (Week 3): Met OT Short Term Goal 2 (Week 3): Pt will complete LB dressing with Mod A and ADL AE OT Short Term Goal 2 - Progress (Week 3): Met OT Short Term Goal 3 (Week 3): Pt will recall energy conservation techniques with min verbal cues during ADL task OT Short Term Goal 3 - Progress (Week 3): Met Week 4:  OT Short Term Goal 1 (Week 4): LTG=STG 2/2 estimated dc date  Skilled Therapeutic Interventions/Progress Updates:    Pt greeted supine in bed with spouse and daughter present.  Surgical PA entered room and gave verbal orders for therapy to resume within the room only until [name] tube replaced. Pt reported he did not get out of bed yesterday 2/2 medical hold- pt notably weaker today, requiring Max A for LB dressing at EOB. Pt also incontinent of bowel, requiring total A for peri-care in standing. Pt completed 4 sit<>stands with Min/Mod with OT facilitating anterior weight shift and trunk extension. Pt tolerated 5 minutes at longest standing bout. UB strengthening seated EOB 10x3  sets of shoulder Ff, shoulder ABd/ADd. Pt reached max fatigue and returned to bed with Mod A. Pt left with needs met and family present.   Therapy Documentation Precautions:  Precautions Precautions: Fall Precaution Comments: NPO, gastrostomy tube, LLQ JP drain, abdominal surgical incision with pouch Restrictions Weight Bearing Restrictions: No General: General OT Amount of Missed Time: 15 Minutes Pain:  denies pain See Function Navigator for Current Functional Status.   Therapy/Group: Individual Therapy  Valma Cava 03/18/2016, 3:33 PM

## 2016-03-18 NOTE — Progress Notes (Signed)
Speech Language Pathology Daily Session Note  Patient Details  Name: Danny GalRalph T Bovee MRN: [id number] Date of Birth: 06-04-1940  Today's Date: 03/18/2016 SLP Individual Time: 1315-1400 SLP Individual Time Calculation (min): 45 min  Short Term Goals: Week 3: SLP Short Term Goal 1 (Week 3): Pt will utilize speech intelligibility strategies at the sentence level with Min A verbal cues to achieve  ~50% intelligibility.  SLP Short Term Goal 2 (Week 3): Pt will complete semi-complex tasks with Mod A verbal cues. SLP Short Term Goal 3 (Week 3): Pt will sustain attention to functional tasks for ~ 30 minutes with Min A verbal cues.  SLP Short Term Goal 4 (Week 3): Pt will utilize external memory aids to recall new, daily information with Min A verbal cues.  SLP Short Term Goal 5 (Week 3): Pt will consistently demonstrate O x 4 with supervision verbal cues.  SLP Short Term Goal 6 (Week 3): Pt will consume therapeutic trials of ice chips with minimal overt s/s of aspiration over 2 consecutive sessions to demonstrate readiness for repeat MBS.   Skilled Therapeutic Interventions: Skilled treatment session focused on cogntion goals. Wife and daughter present. SLP facilitated session by re-administering MOCA Blind. Pt scored 15 out of 22 possible points (n>/=18) which is one point higher than previous score of 14 out 22 on 02/29/16. Education provided that deficits remain in the areas of memory which impact orientation and the ability to effectively implement speech intelligibility strategies and sustained attention to task. Pt was ~90% intelligibility with Min A verbal cues at the sentence level. Pt was left with wife and daughter and all needs within reach.   Of note, pt continues with chronic cough that produces thick yellowish mucus that he can expectrate orally. No PO trials this session.      Function:    Cognition Comprehension Comprehension assist level: Follows basic conversation/direction with  extra time/assistive device  Expression   Expression assist level: Expresses basic 75 - 89% of the time/requires cueing 10 - 24% of the time. Needs helper to occlude trach/needs to repeat words.  Social Interaction Social Interaction assist level: Interacts appropriately with others with medication or extra time (anti-anxiety, antidepressant).  Problem Solving Problem solving assist level: Solves basic 75 - 89% of the time/requires cueing 10 - 24% of the time  Memory Memory assist level: Recognizes or recalls 75 - 89% of the time/requires cueing 10 - 24% of the time    Pain    Therapy/Group: Individual Therapy  Derran Sear B. Dreama Saaverton, M.S., CCC-SLP Speech-Language Pathologist  Wynetta Seith Dreama Saaverton 03/18/2016, 2:26 PM

## 2016-03-18 NOTE — Progress Notes (Signed)
Pt returned from Ir. Frutoso ChaseGtube in place. Pt alert and oriented, no c/o pain at this time, SR up x 2, call light in reach, family at bedside. Continue plan of care.

## 2016-03-18 NOTE — Progress Notes (Signed)
Physical Therapy Weekly Progress Note  Patient Details  Name: [name] MRN: [id number] Date of Birth: 03/20/1940  Beginning of progress report period: March 11, 2016 End of progress report period: March 18, 2016  Today's Date: 03/18/2016 PT Individual Time: 1520-1550 PT Individual Time Calculation (min): 30 min   Patient has met 2 of 5 short term goals. Pt is supervision - Min A with all functional mobility and is progressing towards LTG's. Pt was on medical hold yesterday due to dislodged GJ tube. Therapy was restricted to room and pt needed increased assistance with all exercises and tasks.   Patient continues to demonstrate the following deficits dizziness muscle weakness, decreased cardiorespiratoy endurance, decreased problem solving and delayed processing, peripheral and decreased standing balance and decreased balance strategies and therefore will continue to benefit from skilled PT intervention to increase functional independence with mobility.  Patient not progressing toward long term goals.  See goal revision..  Plan of care revisions: bed mobility downgraded to supervision; Chair/bed transfer downgraded to min A.  PT Short Term Goals Week 3:  PT Short Term Goal 1 (Week 3): Pt will ambulate 100 ft supervision with RW  PT Short Term Goal 1 - Progress (Week 3): Met PT Short Term Goal 2 (Week 3): Pt will demonstrate sit-to-stand and stand pivot transfers Supervision with RW consistently PT Short Term Goal 2 - Progress (Week 3): Progressing toward goal PT Short Term Goal 3 (Week 3): Pt will ascend and descend 3 standard steps with bilateral rails min A PT Short Term Goal 3 - Progress (Week 3): Met PT Short Term Goal 4 (Week 3): Pt will demonstrate ability to travel 100 ft supervision in W/C with minimal need for rest breaks before continung with therapy. PT Short Term Goal 4 - Progress (Week 3): Met PT Short Term Goal 5 (Week 3): Pt will demonstrate ability to perform BUE  supported standing tolerance for 3 min consistently PT Short Term Goal 5 - Progress (Week 3): Progressing toward goal Week 4:  PT Short Term Goal 1 (Week 4): = LTG due to estimated LOS  Skilled Therapeutic Interventions/Progress Updates:  Pt was in bed upon arrival. Verbal orders from nursing to remain in room for therapy. Pt was complaining of no pain.  Pt performed supine exercises in bed. SLR and heel slides 1x10.  quad sets 1x15, ankle pumps 1 x 40. Pt required Supervision to transfer to EOB with HOB elevated and handrails. Pt performed 4 sit-to-stand transfers Min A lift and lowering with RW . Pt performed 1x10 seated 5 sec hold hip adduction isometrics  with pillow between knees. Patient returned to bed Mod A. Pt missed 30 minutes skilled PT due to transport arriving for IR.    Therapy Documentation Precautions:  Precautions Precautions: Fall Precaution Comments: NPO, gastrostomy tube, LLQ JP drain, abdominal surgical incision with pouch Restrictions Weight Bearing Restrictions: No   See Function Navigator for Current Functional Status.  Therapy/Group: Individual Therapy  [name] 03/18/2016, 7:27 AM

## 2016-03-18 NOTE — Progress Notes (Signed)
[facility]IC[facility] & REHABILITATION     PROGRESS NOTE  Subjective/Complaints: Patient is getting J-tube replaced today No new issues overnight. Daughter is at bedside  ROS: Denies vomiting, diarrhea, shortness of breath or chest pain.  Objective: Vital Signs: Blood pressure 124/76, pulse 77, temperature 97.8 F (36.6 C), temperature source Oral, resp. rate 18, height 6\' 2"  (1.88 m), weight 81 kg (178 lb 9.2 oz), SpO2 95 %. No results found.  Recent Labs  03/17/16 0536  WBC 7.3  HGB 8.8*  HCT 28.1*  PLT 344    Recent Labs  03/17/16 0536  NA 135  K 4.3  CL 108  GLUCOSE 97  BUN 27*  CREATININE 0.68  CALCIUM 8.3*   CBG (last 3)   Recent Labs  03/17/16 1938 03/18/16 0005 03/18/16 0439  GLUCAP 142* 127* 147*    Wt Readings from Last 3 Encounters:  03/18/16 81 kg (178 lb 9.2 oz)  02/26/16 78.6 kg (173 lb 4.5 oz)  01/19/16 79.2 kg (174 lb 8 oz)    Physical Exam:  BP 124/76 (BP Location: Left Arm)   Pulse 77   Temp 97.8 F (36.6 C) (Oral)   Resp 18   Ht 6\' 2"  (1.88 m)   Wt 81 kg (178 lb 9.2 oz)   SpO2 95%   BMI 22.93 kg/m  Constitutional: NAD. Vital signs reviewed. Frail.  HENT: Normocephalic and atraumatic. +Stoma . No air  Eyes: EOMI. No discharge.  Cardiovascular: RRR. No JVD. Respiratory: CTA bilaterally. Unlabored.  GI: He exhibits no distension. BS+. +Eakins pouch. J tube without drainage Musculoskeletal: He exhibits no edema. He exhibits no tenderness.  Neurological: He is alert.  Sitting balance is good Speech with dysarthria Motor: 4/5 throughout (Stable) Skin: Skin is warm and dry. Abdominal wounds with dressing.   Assessment/Plan: 1. Functional deficits secondary to debility and encephalopathy which require 3+ hours per day of interdisciplinary therapy in a comprehensive inpatient rehab setting. Physiatrist is providing close team supervision and 24 hour management of active medical problems listed below. Physiatrist and rehab  team continue to assess barriers to discharge/monitor patient progress toward functional and medical goals.  Function:  Bathing Bathing position Bathing activity did not occur: Refused Position: Wheelchair/chair at sink  Bathing parts Body parts bathed by patient: Right arm, Left arm, Chest, Front perineal area, Right upper leg, Right lower leg, Left upper leg, Left lower leg Body parts bathed by helper: Buttocks  Bathing assist Assist Level: Touching or steadying assistance(Pt > 75%), Set up   Set up : To obtain items, To open containers, To adjust water temperature  Upper Body Dressing/Undressing Upper body dressing Upper body dressing/undressing activity did not occur: Safety/medical concerns What is the patient wearing?: Pull over shirt/dress     Pull over shirt/dress - Perfomed by patient: Thread/unthread right sleeve, Thread/unthread left sleeve, Put head through opening Pull over shirt/dress - Perfomed by helper: Pull shirt over trunk        Upper body assist Assist Level: Touching or steadying assistance(Pt > 75%)      Lower Body Dressing/Undressing Lower body dressing Lower body dressing/undressing activity did not occur: Safety/medical concerns What is the patient wearing?: Pants, Non-skid slipper socks     Pants- Performed by patient: Thread/unthread left pants leg, Thread/unthread right pants leg Pants- Performed by helper: Pull pants up/down Non-skid slipper socks- Performed by patient: Don/doff right sock Non-skid slipper socks- Performed by helper: Don/doff left sock, Don/doff right sock   Socks - Performed  by helper: Don/doff right sock, Don/doff left sock   Shoes - Performed by helper: Don/doff right shoe, Don/doff left shoe, Fasten right, Fasten left       TED Hose - Performed by helper: Don/doff right TED hose, Don/doff left TED hose  Lower body assist Assist for lower body dressing:  (Mod A)      Toileting Toileting Toileting activity did not occur:  No continent bowel/bladder event Toileting steps completed by patient: Adjust clothing prior to toileting Toileting steps completed by helper: Performs perineal hygiene, Adjust clothing after toileting Toileting Assistive Devices: Grab bar or rail  Toileting assist Assist level: Touching or steadying assistance (Pt.75%)   Transfers Chair/bed transfer   Chair/bed transfer method: Ambulatory Chair/bed transfer assist level: Touching or steadying assistance (Pt > 75%) Chair/bed transfer assistive device: Armrests, Patent attorney     Max distance: 100 Assist level: Touching or steadying assistance (Pt > 75%)   Wheelchair   Type: Manual Max wheelchair distance: 40  Assist Level: Supervision or verbal cues  Cognition Comprehension Comprehension assist level: Follows basic conversation/direction with extra time/assistive device  Expression Expression assist level: Expresses basic 75 - 89% of the time/requires cueing 10 - 24% of the time. Needs helper to occlude trach/needs to repeat words.  Social Interaction Social Interaction assist level: Interacts appropriately with others with medication or extra time (anti-anxiety, antidepressant).  Problem Solving Problem solving assist level: Solves basic 75 - 89% of the time/requires cueing 10 - 24% of the time  Memory Memory assist level: Recognizes or recalls 75 - 89% of the time/requires cueing 10 - 24% of the time    Medical Problem List and Plan: 1. Debility and encephalopathy secondary to small bowel obstruction and associated complications   Cont CIR PT, OT, speech    Cont NPO   No crushed meds through feeding tube  Leakage around J-tube plan replace per GEN SURG today 2. DVT Prophylaxis/Anticoagulation: Pharmaceutical: Lovenox  3. Pain Management: IV morphine every 4 hours prn severe pain  4. Mood: team to provide ego support. LCSW to follow for evaluation and support.   Prozac started 1/18 5. Neuropsych: This  patient is not fully capable of making decisions on his own behalf.  6. Skin/Wound Care: Continue air mattress overlay. Continue large Eakin's pouch to manage wound drainage. WOC for management. Encourage side lying to help manage MASD.  7. Malnutrition/Fluids/Electrolytes/Nutrition: continue TPN.   Albumin continues to improve 8. Abdominal abscess: Leucocytosis resolving. Completed rocephin 1/17 9. Anemia of chronic illness:   Hb 8.8 on 2/1  Cont to monitor 10. Electrolyte abnormality/Acute renal insufficiency: Managed by TNA   Mag continues to improve on 2/1 11. Encephalopathy: Changed haldol to IM prn for management of agitation/anxiety.  12. Hyperglycemia  Due to TPN  Overall controlled 2/1   13 Sleep disturbance  Has tried Haldol without benefit, ativan causes hallucinations  Continue Xanax elixer,  Improved 14. Mild hyponatremia:   Na+ 135 on 2/1  LOS (Days) 21 A FACE TO FACE EVALUATION WAS PERFORMED  Zayleigh Stroh E 03/18/2016 8:45 AM

## 2016-03-18 NOTE — Progress Notes (Signed)
Social Work Patient ID: [name], male   DOB: 02-22-1940, 76 y.o.   MRN: [id number]  Have send out FL2 and am looking for short term NH bed. Wife and daughter feel at this time they are not able to provide the care He requires at this time, medically complex. Wife and daughter here daily and providing support to pt. Spoke with [facility] who reports medical director will be reviewing pt's chart.

## 2016-03-18 NOTE — Progress Notes (Signed)
Pt down to IR for Gtube replacement.

## 2016-03-18 NOTE — Progress Notes (Signed)
Called IR to clarify that bulb intact.  [facility]

## 2016-03-19 ENCOUNTER — Inpatient Hospital Stay (HOSPITAL_COMMUNITY): Payer: Medicare Other | Admitting: Physical Therapy

## 2016-03-19 LAB — GLUCOSE, CAPILLARY
GLUCOSE-CAPILLARY: 126 mg/dL — AB (ref 65–99)
GLUCOSE-CAPILLARY: 94 mg/dL (ref 65–99)
GLUCOSE-CAPILLARY: 93 mg/dL (ref 65–99)
GLUCOSE-CAPILLARY: 86 mg/dL (ref 65–99)
GLUCOSE-CAPILLARY: 123 mg/dL — AB (ref 65–99)
Glucose-Capillary: 119 mg/dL — ABNORMAL HIGH (ref 65–99)

## 2016-03-19 MED ORDER — TRACE MINERALS CR-CU-MN-SE-ZN 10-1000-500-60 MCG/ML IV SOLN
INTRAVENOUS | Status: AC
  Administered 2016-03-19: 18:00:00 via INTRAVENOUS
  Filled 2016-03-19: qty 1560

## 2016-03-19 MED ORDER — FAT EMULSION 20 % IV EMUL
240.0000 mL | INTRAVENOUS | Status: AC
  Administered 2016-03-19: 240 mL via INTRAVENOUS
  Filled 2016-03-19: qty 250

## 2016-03-19 NOTE — Progress Notes (Signed)
[facility] MEDIC[facility] & RE[facility]ABILITATION     PROGRESS NOTE  Subjective/Complaints: No problems with GJ tube replacement  ROS: Denies vomiting, diarrhea, shortness of breath or chest pain.  Objective: Vital Signs: Blood pressure (!) 137/51, pulse 76, temperature 97.9 F (36.6 C), temperature source Oral, resp. rate 18, height 6\' 2"  (1.88 m), weight 79.5 kg (175 lb 4.3 oz), SpO2 98 %. Ir Gj Tube Change  Result Date: 03/18/2016 INDICATION: Ruptured gastrostomy balloon. Please perform fluoroscopic guided gastrojejunostomy catheter exchange. EXAM: FLUOROSCOPIC GUIDED REPLACEMENT OF GASTROJEJUNOSTOMY TUBE COMPARISON:  Gastrojejunostomy catheter injection - earlier same day MEDICATIONS: None. CONTRAST:  10 cc Isovue-300 FLUOROSCOPY TIME:  1 minute 6 seconds (4.9 mGy) COMPLICATIONS: None. PROCEDURE: Informed written consent was obtained from the patient after a discussion of the risks and benefits. The upper abdomen and the external portion of the existing gastrojejunostomy tube was prepped and draped in the usual sterile fashion, and a sterile drape was applied covering the operative field. Maximum barrier sterile technique with sterile gowns and gloves were used for the procedure. A timeout was performed prior to the initiation of the procedure. The jejunal lumen of the existing gastrojejunostomy catheter was cannulated with a stiff Glidewire advanced into the proximal small bowel. Under intermittent fluoroscopic guidance, the existing gastric jejunostomy catheter was exchanged for a new 30 French GJ tube with tip ultimately terminating within the proximal small bowel. The gastrostomy balloon was inflated with saline and dilute contrast and pulled taut against the ventral wall of the stomach. The disc was cinched. Contrast injection via the jejunostomy and gastric lumens confirmed appropriate functioning and positioning. A dressing was placed. The patient tolerated procedure well without immediate  postprocedural complication. IMPRESSION: Successful fluoroscopic guided replacement of 30 French gastrojejunostomy catheter. The tip of the jejunostomy lumen lies within the proximal jejunum. Both lumens ready for immediate use. Electronically Signed   By: Simonne Come M.D.   On: 03/18/2016 18:08   Ir Cm Inj Any Colonic Tube W/fluoro  Result Date: 03/18/2016 INDICATION: History of gastrojejunostomy catheter, now with concern for catheter dislodgement. EXAM: FLUOROSCOPIC GUIDED GASTROJEJUNOSTOMY TUBE INJECTION COMPARISON:  Small bowel series - 03/01/2016 MEDICATIONS: None. CONTRAST:  20 cc Isovue 300, administered via both the gastric and jejunal lumens FLUOROSCOPY TIME:  36 seconds (1 mGy) COMPLICATIONS: None. PROCEDURE: The patient was positioned supine on the fluoroscopy table. A preprocedural spot fluoroscopic image was obtained. Contrast injection of the gastric lumen demonstrates brisk opacification of the stomach lumen. Contrast injection of the gym lumen demonstrates brisk opacification of the small bowel. No evidence of catheter kink, fracture or contrast extravasation. A dressing was placed. The patient tolerated procedure well without immediate postprocedural complication. IMPRESSION: Appropriate positioned and functioning gastrojejunostomy tube. No exchange performed. Electronically Signed   By: Simonne Come M.D.   On: 03/18/2016 13:02    Recent Labs  03/17/16 0536  WBC 7.3  HGB 8.8*  HCT 28.1*  PLT 344    Recent Labs  03/17/16 0536  NA 135  K 4.3  CL 108  GLUCOSE 97  BUN 27*  CREATININE 0.68  CALCIUM 8.3*   CBG (last 3)   Recent Labs  03/19/16 0432 03/19/16 0433 03/19/16 0748  GLUCAP <10* 126* 94    Wt Readings from Last 3 Encounters:  03/19/16 79.5 kg (175 lb 4.3 oz)  02/26/16 78.6 kg (173 lb 4.5 oz)  01/19/16 79.2 kg (174 lb 8 oz)    Physical Exam:  BP (!) 137/51 (BP Location: Left Arm)  Pulse 76   Temp 97.9 F (36.6 C) (Oral)   Resp 18   Ht 6\' 2"  (1.88 m)    Wt 79.5 kg (175 lb 4.3 oz)   SpO2 98%   BMI 22.50 kg/m  Constitutional: NAD. Vital signs reviewed. Frail.  HENT: Normocephalic and atraumatic. +Stoma . No air  Eyes: EOMI. No discharge.  Cardiovascular: RRR. No JVD. Respiratory: CTA bilaterally. Unlabored.  GI: He exhibits no distension. BS+. +Eakins pouch. J tube without drainage Musculoskeletal: He exhibits no edema. He exhibits no tenderness.  Neurological: He is alert.  Sitting balance is good Speech with dysarthria Motor: 4/5 throughout (Stable) Skin: Skin is warm and dry. Abdominal wounds with dressing.   Assessment/Plan: 1. Functional deficits secondary to debility and encephalopathy which require 3+ hours per day of interdisciplinary therapy in a comprehensive inpatient rehab setting. Physiatrist is providing close team supervision and 24 hour management of active medical problems listed below. Physiatrist and rehab team continue to assess barriers to discharge/monitor patient progress toward functional and medical goals.  Function:  Bathing Bathing position Bathing activity did not occur: Refused Position: Wheelchair/chair at sink  Bathing parts Body parts bathed by patient: Right arm, Left arm, Chest, Front perineal area, Right upper leg, Right lower leg, Left upper leg, Left lower leg Body parts bathed by helper: Buttocks  Bathing assist Assist Level: Touching or steadying assistance(Pt > 75%), Set up   Set up : To obtain items, To open containers, To adjust water temperature  Upper Body Dressing/Undressing Upper body dressing Upper body dressing/undressing activity did not occur: Safety/medical concerns What is the patient wearing?: Pull over shirt/dress     Pull over shirt/dress - Perfomed by patient: Thread/unthread right sleeve, Thread/unthread left sleeve Pull over shirt/dress - Perfomed by helper: Pull shirt over trunk, Put head through opening        Upper body assist Assist Level: Touching or steadying  assistance(Pt > 75%)      Lower Body Dressing/Undressing Lower body dressing Lower body dressing/undressing activity did not occur: Safety/medical concerns What is the patient wearing?: Pants, Ted Hose, Non-skid slipper socks     Pants- Performed by patient: Thread/unthread left pants leg Pants- Performed by helper: Thread/unthread right pants leg, Pull pants up/down Non-skid slipper socks- Performed by patient: Don/doff right sock Non-skid slipper socks- Performed by helper: Don/doff right sock, Don/doff left sock   Socks - Performed by helper: Don/doff right sock, Don/doff left sock   Shoes - Performed by helper: Don/doff right shoe, Don/doff left shoe, Fasten right, Fasten left       TED Hose - Performed by helper: Don/doff left TED hose, Don/doff right TED hose  Lower body assist Assist for lower body dressing:  (Max A)      Toileting Toileting Toileting activity did not occur: No continent bowel/bladder event Toileting steps completed by patient: Adjust clothing prior to toileting Toileting steps completed by helper: Performs perineal hygiene, Adjust clothing after toileting Toileting Assistive Devices: Grab bar or rail  Toileting assist Assist level: Touching or steadying assistance (Pt.75%)   Transfers Chair/bed transfer   Chair/bed transfer method: Ambulatory Chair/bed transfer assist level: Touching or steadying assistance (Pt > 75%) Chair/bed transfer assistive device: Armrests, Patent attorney     Max distance: 100 Assist level: Touching or steadying assistance (Pt > 75%)   Wheelchair   Type: Manual Max wheelchair distance: 40  Assist Level: Supervision or verbal cues  Cognition Comprehension Comprehension assist level: Follows basic conversation/direction  with extra time/assistive device  Expression Expression assist level: Expresses basic 75 - 89% of the time/requires cueing 10 - 24% of the time. Needs helper to occlude trach/needs to  repeat words.  Social Interaction Social Interaction assist level: Interacts appropriately with others with medication or extra time (anti-anxiety, antidepressant).  Problem Solving Problem solving assist level: Solves basic 75 - 89% of the time/requires cueing 10 - 24% of the time  Memory Memory assist level: Recognizes or recalls 75 - 89% of the time/requires cueing 10 - 24% of the time    Medical Problem List and Plan: 1. Debility and encephalopathy secondary to small bowel obstruction and associated complications   Cont CIR PT, OT, speech    Cont NPO   No crushed meds through feeding tube  Enterocutaneous fistula, limiting tube feedings to 30 cc/h 2. DVT Prophylaxis/Anticoagulation: Pharmaceutical: Lovenox  3. Pain Management: IV morphine every 4 hours prn severe pain  4. Mood: team to provide ego support. LCSW to follow for evaluation and support.   Prozac started 1/18 5. Neuropsych: This patient is not fully capable of making decisions on his own behalf.  6. Skin/Wound Care: Continue air mattress overlay. Continue large Eakin's pouch to manage wound drainage. WOC for management. Encourage side lying to help manage MASD.  7. Malnutrition/Fluids/Electrolytes/Nutrition: continue TPN.   Albumin continues to improve 8. Abdominal abscess: Leucocytosis resolving. Completed rocephin 1/17 9. Anemia of chronic illness:   Hb 8.8 on 2/1  Cont to monitor 10. Electrolyte abnormality/Acute renal insufficiency: Managed by TNA   Mag continues to improve on 2/1 11. Encephalopathy: Changed haldol to IM prn for management of agitation/anxiety.  12. Hyperglycemia  Due to TPN   CBG (last 3) 0432 value in error  Recent Labs  03/19/16 0432 03/19/16 0433 03/19/16 0748  GLUCAP <10* 126* 94      13 Sleep disturbance  Has tried Haldol without benefit, ativan causes hallucinations  Continue Xanax elixer,  Improved 14.  hyponatremia: resolved    LOS (Days) 22 A FACE TO FACE EVALUATION WAS  PERFORMED  Davinder Haff E 03/19/2016 10:52 AM

## 2016-03-19 NOTE — Progress Notes (Signed)
Physical Therapy Session Note  Patient Details  Name: Ardyth GalRalph T Ketcham MRN: [id number] Date of Birth: [birth year]  Today's Date: 03/19/2016 PT Individual Time: 1303-1401 PT Individual Time Calculation (min): 58 min   Short Term Goals: Week 4:  PT Short Term Goal 1 (Week 4): = LTG due to estimated LOS  Skilled Therapeutic Interventions/Progress Updates:  Pt was supine in bed with wife present in room. Pt was not complaining of any pain. Pt was incontinent with recent bowel movement and required assistance. Pt required min A with supine to sit transfer with HOB elevated and use of handrails. Sit-to-stand transfers throughout therapy were supervision - min A on lifting with pt requiring supervision as treatment continued. Pt ambulated 10 ft CGA to bathroom to sit on elevated commode. Pt required total assistance with perineal cleaning, don/doff pants, and remove and apply new briefs. Pt ambulated 100 ft and 70 ft with RW supervision. Pt self-propelled in W/C 90 ft supervision with verbal cues on sequencing. Pt participated in standing balance exercise with dynavision using Rw. Pt stood for 1 min and 2.5 min with a score of 19 and 44 lights during the session. Pt was left in room sitting up in W/C with all needs within reach and wife present.      Therapy Documentation Precautions:  Precautions Precautions: Fall Precaution Comments: NPO, gastrostomy tube, LLQ JP drain, abdominal surgical incision with pouch Restrictions Weight Bearing Restrictions: No   See Function Navigator for Current Functional Status.   Therapy/Group: Individual Therapy    Rudie MeyerJeffrey Burnis Halling 03/19/2016, 12:50 PM

## 2016-03-19 NOTE — Progress Notes (Signed)
PHARMACY - TOTAL PARENTERAL NUTRITION CONSULT NOTE   Pharmacy Consult:  TPN Indication:  SBO and new EC fistula 02/13/16  Patient Measurements: Height: _0  (188 cm) Weight: 175 lb 4.3 oz (79.5 kg) IBW/kg (Calculated) : 82.2   Body mass index is 22.5 kg/m.  Assessment:  37 YOM with history of multiple procedures and extended hospitalization for recurrent partial GOO 2/2 pyloric stricture.  Patient was on TPN for several days, then discharged on 12/29/16 with J/G tube in place for TF. He was unable to tolerate TF and was transitioned back to TPN by [facility] on 01/05/17.  Patient was readmitted on 01/19/17 with partial SBO.  Patient failed MBS on 01/26/16 and did not tolerate bolus feeds through G-tube on 01/27/16. On 12/23, he underwent ex-lap with LoA, segmental SBR, and gastrojejunostomy. On 02/12/16, TF were found draining from midline incision with CT abd showing bowel perforation at [place] site and development of abscesses.   GI: EC fistula at SB jejunal anastomosis which is decreasing in size. Has had trouble advancing TFs so had been advancing very slowly per surgery. On 2/2 GJ tube needed to be replaced so TFs were held. Now TFs have been restarted and are running at 18m/hr. Surgery plans to leave at 368mhr until fistula closes. Albumin low at 1.8, prealbumin ok at 26.7. Drain output remains high at 1.1 L yesterday. Endo: no hx DM. CBGs 80-120s. No SSI  Lytes: wnl. CoCa 10. Mg and Phos ok. Renal: SCr stable. BUN stable at 27. NS at 10050mr Pulm: RA - nicotine patch Cards: orthostatic hypotension improved - VSS Hepatobil:AST and Alk Phos slightly elevated. ALT and Tbilli wnl. TG ok at 116. Neuro: Prozac, Xanax, lidocaine patch, PRN morphine, APAP TID Heme/Onc: S/p Iron dextran ID: S/p ceftriaxone for UTI. UCx negative. Afebrile, WBC wnl Best Practices: Lovenox, MC TPN Access: PICC TPN start date: P[facility] > [facility] 01/20/16 > [facility] 02/26/15  Home TPN: 2156 kCal and 75gm protein per day (405g CHO  = 1376 kCal, 50g lipid = 480 kCal)  Nutritional Goals: per RD recs on 1/31 Kcal: 2100-2300 Protein: 95-110 g  Current Nutrition:  Clinimix E 5/15 + lipids Jevity 1.2 at 45m73m for 16 hrs (576 kcal and 29 g of protein) NPO except ice chips  Plan: Decrease cyclic Clinimix E 5/159/52fuse 1560 ml over 12 hrs: 50 mL/hr x 1 hr, then 146 mL/hr x 10 hrs, then 50 mL/hr x 1 hr. Continue IV lipid emulsions 20ml5mover 12 hrs to help meet kcal goal since TG ok Continue Jevity 1.2 at 45ml/45mor 16 hrs per surgery TPN, IV lipid emulsions, and TFs provides 107 g of protein and 2164 kCals per day meeting 100% of protein and kCal needs Add MVI  in TPN Add TE (every other day) in TPN Monitor TPN labs, Bmet tomorrow F/U tolerance of TFs  NathanElenor QuinonesmD, BCPS [facility] Pharmacist Pager [phone] 7:31 AM

## 2016-03-20 ENCOUNTER — Inpatient Hospital Stay (HOSPITAL_COMMUNITY): Payer: Medicare Other | Admitting: Occupational Therapy

## 2016-03-20 LAB — BASIC METABOLIC PANEL
ANION GAP: 5 (ref 5–15)
BUN: 26 mg/dL — ABNORMAL HIGH (ref 6–20)
CO2: 22 mmol/L (ref 22–32)
Calcium: 7.5 mg/dL — ABNORMAL LOW (ref 8.9–10.3)
Chloride: 111 mmol/L (ref 101–111)
Creatinine, Ser: 0.63 mg/dL (ref 0.61–1.24)
GFR calc non Af Amer: >60 mL/min (ref >60)
GLUCOSE: 89 mg/dL (ref 65–99)
POTASSIUM: 3.6 mmol/L (ref 3.5–5.1)
Sodium: 138 mmol/L (ref 135–145)

## 2016-03-20 LAB — GLUCOSE, CAPILLARY
GLUCOSE-CAPILLARY: 104 mg/dL — AB (ref 65–99)
GLUCOSE-CAPILLARY: 97 mg/dL (ref 65–99)
GLUCOSE-CAPILLARY: 95 mg/dL (ref 65–99)
Glucose-Capillary: 114 mg/dL — ABNORMAL HIGH (ref 65–99)
Glucose-Capillary: 75 mg/dL (ref 65–99)
Glucose-Capillary: 127 mg/dL — ABNORMAL HIGH (ref 65–99)

## 2016-03-20 MED ORDER — POTASSIUM CHLORIDE 2 MEQ/ML IV SOLN
30.0000 meq | Freq: Once | INTRAVENOUS | Status: AC
  Administered 2016-03-20: 30 meq via INTRAVENOUS
  Filled 2016-03-20: qty 15

## 2016-03-20 MED ORDER — FAT EMULSION 20 % IV EMUL
240.0000 mL | INTRAVENOUS | Status: AC
  Administered 2016-03-20: 240 mL via INTRAVENOUS
  Filled 2016-03-20: qty 250

## 2016-03-20 MED ORDER — M.V.I. ADULT IV INJ
INTRAVENOUS | Status: AC
  Administered 2016-03-20: 18:00:00 via INTRAVENOUS
  Filled 2016-03-20: qty 1560

## 2016-03-20 NOTE — Progress Notes (Signed)
Occupational Therapy Session Note  Patient Details  Name: [name] MRN: [id number] Date of Birth: February 18, 1940  Today's Date: 03/20/2016 OT Individual Time:  - 0900-1000  (60 min)      Short Term Goals: Week 1:  OT Short Term Goal 1 (Week 1): Pt will complete stand pivot transfers to toilet with mod assist OT Short Term Goal 1 - Progress (Week 1): Met OT Short Term Goal 2 (Week 1): Pt will complete bathing with mod assist at sit > stand level OT Short Term Goal 2 - Progress (Week 1): Partly met OT Short Term Goal 3 (Week 1): Pt will sustain attention to task for 4 mins with min cues OT Short Term Goal 3 - Progress (Week 1): Met OT Short Term Goal 4 (Week 1): Pt will complete sit > stand with mod assist to decrease burden of care with LB dressing/hygiene OT Short Term Goal 4 - Progress (Week 1): Met Week 2:  OT Short Term Goal 1 (Week 2): Pt will complete bathing with mod assist at sit > stand level OT Short Term Goal 1 - Progress (Week 2): Met OT Short Term Goal 2 (Week 2): Pt will tolerate 5 minutes of standing activity in preparation for ADL tasks OT Short Term Goal 2 - Progress (Week 2): Partly met OT Short Term Goal 3 (Week 2): Pt will complete LB dressing with Mod A OT Short Term Goal 3 - Progress (Week 2): Progressing toward goal  Skilled Therapeutic Interventions/Progress Updates:    Pt  Lying in bed.  Addressed bed mobility, sitting balance, transfers to wc, toilet, and AROM.    Peformed bed mobility and supine to sit with min assist.  Ppt resistant to do things for self, but improved with time and more willing to try.  Transfer with min assist to wc and toilet.  Stood for peri care and clothes with min assist.  Pt was incontinent of BM and was unaware that he had had a large BM.  Total assist with pericare and LB dressing.  Transferred from toilet to wc with min assist.  Transfer pt to bed and left with all needs in reach.    Therapy Documentation Precautions:   Precautions Precautions: Fall Precaution Comments: NPO, gastrostomy tube, LLQ JP drain, abdominal surgical incision with pouch Restrictions Weight Bearing Restrictions: No   Pain:  none  See Function Navigator for Current Functional Status.   Therapy/Group: Individual Therapy  [name], [name] 03/20/2016, 9:23 AM

## 2016-03-20 NOTE — Progress Notes (Signed)
PHARMACY - TOTAL PARENTERAL NUTRITION CONSULT NOTE   Pharmacy Consult:  TPN Indication:  SBO and new EC fistula 02/13/16  Patient Measurements: Height: 6' 2"  (188 cm) Weight: 156 lb 4.8 oz (70.9 kg) IBW/kg (Calculated) : 82.2   Body mass index is 20.07 kg/m.  Assessment:  23 YOM with history of multiple procedures and extended hospitalization for recurrent partial GOO 2/2 pyloric stricture.  Patient was on TPN for several days, then discharged on 12/29/16 with J/G tube in place for TF. He was unable to tolerate TF and was transitioned back to TPN by [facility] on 01/05/17.  Patient was readmitted on 01/19/17 with partial SBO.  Patient failed MBS on 01/26/16 and did not tolerate bolus feeds through G-tube on 01/27/16. On 12/23, he underwent ex-lap with LoA, segmental SBR, and gastrojejunostomy. On 02/12/16, TF were found draining from midline incision with CT abd showing bowel perforation at [place] site and development of abscesses.   GI: EC fistula at SB jejunal anastomosis which is decreasing in size. Has had trouble advancing TFs so had been advancing very slowly per surgery. On 2/2 GJ tube needed to be replaced so TFs were held. Now TFs have been restarted and are running at 75m/hr. Surgery plans to leave at 326mhr until fistula closes. Albumin low at 1.8, prealbumin ok at 26.7. Drain output down to 82550mesterday. Endo: no hx DM. CBGs well controlled (80-120s). No SSI  Lytes: wnl exc K down to 3.6. CoCa 9.2. Mg and Phos ok on 2/1. Renal: SCr stable. BUN stable at 27. NS at 100m45m Pulm: RA - nicotine patch Cards: orthostatic hypotension improved - VSS Hepatobil:AST and Alk Phos slightly elevated. ALT and Tbilli wnl. TG ok at 116. Neuro: Prozac, Xanax, lidocaine patch, PRN morphine, APAP TID Heme/Onc: S/p Iron dextran ID: S/p ceftriaxone for UTI. UCx negative. Afebrile, WBC wnl Best Practices: Lovenox, MC TPN Access: PICC TPN start date: PTA > [facility] 01/20/16 > [facility] 02/26/15  Home TPN: 2156  kCal and 75gm protein per day (405g CHO = 1376 kCal, 50g lipid = 480 kCal)  Nutritional Goals: per RD recs on 1/31 Kcal: 2100-2300 Protein: 95-110 g  Current Nutrition:  Clinimix E 5/15 + lipids Jevity 1.2 at 30ml28mfor 16 hrs (576 kcal and 29 g of protein) NPO except ice chips  Plan: Continue cyclic Clinimix E 5/15.1/77use 1560 ml over 12 hrs: 50 mL/hr x 1 hr, then 146 mL/hr x 10 hrs, then 50 mL/hr x 1 hr. Continue IV lipid emulsions 20ml/20mver 12 hrs to help meet kcal goal since TG and LFTs ok Continue Jevity 1.2 at 30ml/h36mr 16 hrs per surgery NS at 100ml/hr46m MD TPN, IV lipid emulsions, and TFs provides 107 g of protein and 2164 kCals per day meeting 100% of protein and kCal needs Add MVI  in TPN Add TE every other day in TPN due to national backorder (Next dose 2/5) Monitor TPN labs tomorrow F/U ability to advance TFs  Give potassium chloride 30mEq x 30mathan BaElenor [name] BCPS [facility] Pharmacist Pager [phone] 7:47 AM

## 2016-03-20 NOTE — Progress Notes (Signed)
[place] PHYSICAL MEDICINE & REHABILITATION     PROGRESS NOTE  Subjective/Complaints:  complained of dizziness to nursing earlier this morning, now feels okay. Denies any abdominal pain at this time  ROS: Denies vomiting, diarrhea, shortness of breath or chest pain.  Objective: Vital Signs: Blood pressure 123/68, pulse 80, temperature 97.4 F (36.3 C), temperature source Oral, resp. rate 18, height 6\' 2"  (1.88 m), weight 70.9 kg (156 lb 4.8 oz), SpO2 98 %. Ir Gj Tube Change  Result Date: 03/18/2016 INDICATION: Ruptured gastrostomy balloon. Please perform fluoroscopic guided gastrojejunostomy catheter exchange. EXAM: FLUOROSCOPIC GUIDED REPLACEMENT OF GASTROJEJUNOSTOMY TUBE COMPARISON:  Gastrojejunostomy catheter injection - earlier same day MEDICATIONS: None. CONTRAST:  10 cc Isovue-300 FLUOROSCOPY TIME:  1 minute 6 seconds (4.9 mGy) COMPLICATIONS: None. PROCEDURE: Informed written consent was obtained from the patient after a discussion of the risks and benefits. The upper abdomen and the external portion of the existing gastrojejunostomy tube was prepped and draped in the usual sterile fashion, and a sterile drape was applied covering the operative field. Maximum barrier sterile technique with sterile gowns and gloves were used for the procedure. A timeout was performed prior to the initiation of the procedure. The jejunal lumen of the existing gastrojejunostomy catheter was cannulated with a stiff Glidewire advanced into the proximal small bowel. Under intermittent fluoroscopic guidance, the existing gastric jejunostomy catheter was exchanged for a new 30 French GJ tube with tip ultimately terminating within the proximal small bowel. The gastrostomy balloon was inflated with saline and dilute contrast and pulled taut against the ventral wall of the stomach. The disc was cinched. Contrast injection via the jejunostomy and gastric lumens confirmed appropriate functioning and positioning. A dressing  was placed. The patient tolerated procedure well without immediate postprocedural complication. IMPRESSION: Successful fluoroscopic guided replacement of 30 French gastrojejunostomy catheter. The tip of the jejunostomy lumen lies within the proximal jejunum. Both lumens ready for immediate use. Electronically Signed   By: Simonne Come M.D.   On: 03/18/2016 18:08   Ir Cm Inj Any Colonic Tube W/fluoro  Result Date: 03/18/2016 INDICATION: History of gastrojejunostomy catheter, now with concern for catheter dislodgement. EXAM: FLUOROSCOPIC GUIDED GASTROJEJUNOSTOMY TUBE INJECTION COMPARISON:  Small bowel series - 03/01/2016 MEDICATIONS: None. CONTRAST:  20 cc Isovue 300, administered via both the gastric and jejunal lumens FLUOROSCOPY TIME:  36 seconds (1 mGy) COMPLICATIONS: None. PROCEDURE: The patient was positioned supine on the fluoroscopy table. A preprocedural spot fluoroscopic image was obtained. Contrast injection of the gastric lumen demonstrates brisk opacification of the stomach lumen. Contrast injection of the gym lumen demonstrates brisk opacification of the small bowel. No evidence of catheter kink, fracture or contrast extravasation. A dressing was placed. The patient tolerated procedure well without immediate postprocedural complication. IMPRESSION: Appropriate positioned and functioning gastrojejunostomy tube. No exchange performed. Electronically Signed   By: Simonne Come M.D.   On: 03/18/2016 13:02   No results for input(s): WBC, HGB, HCT, PLT in the last 72 hours.  Recent Labs  03/20/16 0539  NA 138  K 3.6  CL 111  GLUCOSE 89  BUN 26*  CREATININE 0.63  CALCIUM 7.5*   CBG (last 3)   Recent Labs  03/20/16 0101 03/20/16 0430 03/20/16 0808  GLUCAP 114* 104* 97    Wt Readings from Last 3 Encounters:  03/20/16 70.9 kg (156 lb 4.8 oz)  02/26/16 78.6 kg (173 lb 4.5 oz)  01/19/16 79.2 kg (174 lb 8 oz)    Physical Exam:  BP 123/68 (BP  Location: Left Arm)   Pulse 80   Temp 97.4  F (36.3 C) (Oral)   Resp 18   Ht 6\' 2"  (1.88 m)   Wt 70.9 kg (156 lb 4.8 oz)   SpO2 98%   BMI 20.07 kg/m  Constitutional: NAD. Vital signs reviewed. Frail.  HENT: Normocephalic and atraumatic. +Stoma . No air  Eyes: EOMI. No discharge.  Cardiovascular: RRR. No JVD. Respiratory: CTA bilaterally. Unlabored.  GI: He exhibits no distension. BS+. +Eakins pouch. J tube without drainage . No pain to palpation                                                                                                                            Musculoskeletal: He exhibits no edema. He exhibits no tenderness.  Neurological: He is alert.  Sitting balance is good Speech with dysarthria Motor: 4/5 throughout (Stable) Skin: Skin is warm and dry. Abdominal wounds with dressing.   Assessment/Plan: 1. Functional deficits secondary to debility and encephalopathy which require 3+ hours per day of interdisciplinary therapy in a comprehensive inpatient rehab setting. Physiatrist is providing close team supervision and 24 hour management of active medical problems listed below. Physiatrist and rehab team continue to assess barriers to discharge/monitor patient progress toward functional and medical goals.  Function:  Bathing Bathing position Bathing activity did not occur: Refused Position: Wheelchair/chair at sink  Bathing parts Body parts bathed by patient: Right arm, Left arm, Chest, Front perineal area, Right upper leg, Right lower leg, Left upper leg, Left lower leg Body parts bathed by helper: Buttocks  Bathing assist Assist Level: Touching or steadying assistance(Pt > 75%), Set up   Set up : To obtain items, To open containers, To adjust water temperature  Upper Body Dressing/Undressing Upper body dressing Upper body dressing/undressing activity did not occur: Safety/medical concerns What is the patient wearing?: Pull over shirt/dress     Pull over shirt/dress - Perfomed by patient: Thread/unthread  right sleeve, Thread/unthread left sleeve Pull over shirt/dress - Perfomed by helper: Pull shirt over trunk, Put head through opening        Upper body assist Assist Level: Touching or steadying assistance(Pt > 75%)      Lower Body Dressing/Undressing Lower body dressing Lower body dressing/undressing activity did not occur: Safety/medical concerns What is the patient wearing?: Pants, Ted Hose, Non-skid slipper socks     Pants- Performed by patient: Thread/unthread left pants leg Pants- Performed by helper: Thread/unthread right pants leg, Pull pants up/down, Thread/unthread left pants leg Non-skid slipper socks- Performed by patient: Don/doff right sock Non-skid slipper socks- Performed by helper: Don/doff right sock, Don/doff left sock   Socks - Performed by helper: Don/doff right sock, Don/doff left sock   Shoes - Performed by helper: Don/doff right shoe, Don/doff left shoe, Fasten right, Fasten left       TED Hose - Performed by helper: Don/doff left TED hose, Don/doff right TED hose  Lower body assist Assist for lower  body dressing: 2 Designer, multimedia activity did not occur: No continent bowel/bladder event Toileting steps completed by patient: Adjust clothing prior to toileting Toileting steps completed by helper: Performs perineal hygiene, Adjust clothing after toileting Toileting Assistive Devices: Grab bar or rail  Toileting assist Assist level: Touching or steadying assistance (Pt.75%)   Transfers Chair/bed transfer   Chair/bed transfer method: Ambulatory Chair/bed transfer assist level: Touching or steadying assistance (Pt > 75%) Chair/bed transfer assistive device: Armrests, Patent attorney     Max distance: 100 Assist level: Supervision or verbal cues   Wheelchair   Type: Manual Max wheelchair distance: 90 ft Assist Level: Supervision or verbal cues  Cognition Comprehension Comprehension assist level: Follows  basic conversation/direction with extra time/assistive device  Expression Expression assist level: Expresses basic 75 - 89% of the time/requires cueing 10 - 24% of the time. Needs helper to occlude trach/needs to repeat words.  Social Interaction Social Interaction assist level: Interacts appropriately with others with medication or extra time (anti-anxiety, antidepressant).  Problem Solving Problem solving assist level: Solves basic 75 - 89% of the time/requires cueing 10 - 24% of the time  Memory Memory assist level: Recognizes or recalls 75 - 89% of the time/requires cueing 10 - 24% of the time    Medical Problem List and Plan: 1. Debility and encephalopathy secondary to small bowel obstruction and associated complications   Cont CIR PT, OT, speech    Cont NPO   No crushed meds through feeding tube  Enterocutaneous fistula, limiting tube feedings to 30 cc/h 2. DVT Prophylaxis/Anticoagulation: Pharmaceutical: Lovenox  3. Pain Management: IV morphine every 4 hours prn severe pain  4. Mood: team to provide ego support. LCSW to follow for evaluation and support.   Prozac started 1/18 5. Neuropsych: This patient is not fully capable of making decisions on his own behalf.  6. Skin/Wound Care: Continue air mattress overlay. Continue large Eakin's pouch to manage wound drainage. WOC for management. Encourage side lying to help manage MASD.  7. Malnutrition/Fluids/Electrolytes/Nutrition: continue TPN.   Albumin continues to improve 8. Abdominal abscess: Leucocytosis resolving. Completed rocephin 1/17 9. Anemia of chronic illness:   Hb 8.8 on 2/1  Cont to monitor 10. Electrolyte abnormality/Acute renal insufficiency: Managed by TNA   Mag continues to improve on 2/1 11. Encephalopathy: Changed haldol to IM prn for management of agitation/anxiety.  12. Hyperglycemia  improved   CBG (last 3) 0432 value in error  Recent Labs  03/20/16 0101 03/20/16 0430 03/20/16 0808  GLUCAP 114* 104* 97       13 Sleep disturbance  Has tried Haldol without benefit, ativan causes hallucinations  Continue Xanax elixer,  Improved     LOS (Days) 23 A FACE TO FACE EVALUATION WAS PERFORMED  Barbarita Hutmacher E 03/20/2016 10:57 AM

## 2016-03-21 ENCOUNTER — Inpatient Hospital Stay (HOSPITAL_COMMUNITY): Payer: Medicare Other | Admitting: Occupational Therapy

## 2016-03-21 ENCOUNTER — Inpatient Hospital Stay (HOSPITAL_COMMUNITY): Payer: Medicare Other | Admitting: Speech Pathology

## 2016-03-21 ENCOUNTER — Inpatient Hospital Stay (HOSPITAL_COMMUNITY): Payer: Medicare Other

## 2016-03-21 LAB — GLUCOSE, CAPILLARY
GLUCOSE-CAPILLARY: 105 mg/dL — AB (ref 65–99)
GLUCOSE-CAPILLARY: 121 mg/dL — AB (ref 65–99)
GLUCOSE-CAPILLARY: 93 mg/dL (ref 65–99)
GLUCOSE-CAPILLARY: 95 mg/dL (ref 65–99)
Glucose-Capillary: 84 mg/dL (ref 65–99)
Glucose-Capillary: 127 mg/dL — ABNORMAL HIGH (ref 65–99)

## 2016-03-21 LAB — PHOSPHORUS: Phosphorus: 3.9 mg/dL (ref 2.5–4.6)

## 2016-03-21 LAB — COMPREHENSIVE METABOLIC PANEL
ALBUMIN: 1.9 g/dL — AB (ref 3.5–5.0)
ALK PHOS: 204 U/L — AB (ref 38–126)
ALT: 50 U/L (ref 17–63)
AST: 47 U/L — ABNORMAL HIGH (ref 15–41)
Anion gap: 7 (ref 5–15)
BUN: 28 mg/dL — AB (ref 6–20)
CALCIUM: 8.2 mg/dL — AB (ref 8.9–10.3)
CO2: 22 mmol/L (ref 22–32)
CREATININE: 0.63 mg/dL (ref 0.61–1.24)
Chloride: 105 mmol/L (ref 101–111)
GFR calc Af Amer: >60 mL/min (ref >60)
GFR calc non Af Amer: >60 mL/min (ref >60)
GLUCOSE: 100 mg/dL — AB (ref 65–99)
Potassium: 4.4 mmol/L (ref 3.5–5.1)
SODIUM: 134 mmol/L — AB (ref 135–145)
Total Bilirubin: 0.4 mg/dL (ref 0.3–1.2)
Total Protein: 5.9 g/dL — ABNORMAL LOW (ref 6.5–8.1)

## 2016-03-21 LAB — DIFFERENTIAL
Basophils Absolute: 0 10*3/uL (ref 0.0–0.1)
Basophils Relative: 1 %
EOS PCT: 1 %
Eosinophils Absolute: 0.1 10*3/uL (ref 0.0–0.7)
LYMPHS PCT: 35 %
Lymphs Abs: 3.1 10*3/uL (ref 0.7–4.0)
MONO ABS: 1 10*3/uL (ref 0.1–1.0)
Monocytes Relative: 12 %
NEUTROS ABS: 4.4 10*3/uL (ref 1.7–7.7)
NEUTROS PCT: 51 %

## 2016-03-21 LAB — MAGNESIUM: Magnesium: 1.8 mg/dL (ref 1.7–2.4)

## 2016-03-21 LAB — CBC
HEMATOCRIT: 28.6 % — AB (ref 39.0–52.0)
HEMOGLOBIN: 9.2 g/dL — AB (ref 13.0–17.0)
MCH: 29.6 pg (ref 26.0–34.0)
MCHC: 32.2 g/dL (ref 30.0–36.0)
MCV: 92 fL (ref 78.0–100.0)
Platelets: 338 10*3/uL (ref 150–400)
RBC: 3.11 MIL/uL — ABNORMAL LOW (ref 4.22–5.81)
RDW: 16 % — AB (ref 11.5–15.5)
WBC: 8.6 10*3/uL (ref 4.0–10.5)

## 2016-03-21 LAB — TRIGLYCERIDES: Triglycerides: 131 mg/dL (ref <150)

## 2016-03-21 LAB — PREALBUMIN: Prealbumin: 28.9 mg/dL (ref 18–38)

## 2016-03-21 MED ORDER — TRACE MINERALS CR-CU-MN-SE-ZN 10-1000-500-60 MCG/ML IV SOLN
INTRAVENOUS | Status: AC
  Administered 2016-03-21: 18:00:00 via INTRAVENOUS
  Filled 2016-03-21: qty 1560

## 2016-03-21 MED ORDER — FAT EMULSION 20 % IV EMUL
240.0000 mL | INTRAVENOUS | Status: AC
  Administered 2016-03-21: 240 mL via INTRAVENOUS
  Filled 2016-03-21: qty 250

## 2016-03-21 NOTE — Progress Notes (Signed)
Physical Therapy Session Note  Patient Details  Name: Danny GalRalph T Narducci MRN: [id number] Date of Birth: Mar 28, 1940  Today's Date: 03/21/2016 PT Individual Time: [id number] PT Individual Time Calculation (min): 27 min   Short Term Goals: Week 4:  PT Short Term Goal 1 (Week 4): = LTG due to estimated LOS  Skilled Therapeutic Interventions/Progress Updates:    Session focused on bed mobility, transfers with RW, gait training with RW, stair negotiation and overall endurance. Pt required min assist overall with transfers; heavier min assist for initial sit -> stand with cues for weightshifting forward using RW for support. Pt able to progress gait x 225' today with steadying assist overall and cues for foot clearance as fatiguing. Stair negotiation x 4 steps with bilateral rails and overall min assist for balance. Pt's wife present during session to observe.   Therapy Documentation Precautions:  Precautions Precautions: Fall Precaution Comments: NPO, gastrostomy tube, LLQ JP drain, abdominal surgical incision with pouch Restrictions Weight Bearing Restrictions: No General:   Vital Signs:  HR = 98 bpm O2 = 97-98% on room air Pain:  No complaints   See Function Navigator for Current Functional Status.   Therapy/Group: Individual Therapy  Karolee StampsGray, Isola Mehlman Darrol PokeBrescia  Dontrel Smethers B. Haruto Demaria, PT, DPT  03/21/2016, 2:52 PM

## 2016-03-21 NOTE — Discharge Summary (Signed)
Physician Discharge Summary  Patient ID: [name] MRN: [id number] DOB/AGE: [birth year] 76 y.o.  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 Gastrojejunostomy. Percutaneous gastrostomy tube with J-tube extension. Percutaneous drainage catheter in the mid abdomen from LEFT approach EXAM: SMALL BOWEL SERIES COMPARISON:  02/19/2016 TECHNIQUE: Following ingestion of thin barium, serial small bowel images were obtained including spot views of the terminal ileum. FLUOROSCOPY TIME:  Fluoroscopy Time:  2 minutes 12 seconds Radiation Exposure Index (if provided by the fluoroscopic device): 31 mGy Number of Acquired Spot Images: 14 FINDINGS: Initial spot few of the abdomen were obtained demonstrating a gastrostomy tube which j arm extension and a LEFT lower quadrant percutaneous drainage. Hand injection of water-soluble contrast through the percutaneous gastrostomy tube which J -arm extension. Contrast flows into the distal small bowel from the jejunostomy tube. Contrast flows initially antegrade and subsequently retrograde. Retrograde Contrast enters the stomach through the gastrojejunostomy. There is a linear collection of contrast paralleling the RIGHT border of the spine. This continues to increase in density over the course of the study ultimately measuring approximately 12 cm in length and 1.2 cm in width. In comparison to most  recent CT, collection represents a cutaneous fluid collection along the ventral abdominal wall with an enteric cutaneous fistula between the gastro jejunostomy anastomosis and the ventral abdominal wound. Contrast ultimately flows distally into the ascending colon. No contrast collects in the abscess associated with the percutaneous drainage catheter. IMPRESSION: 1. 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 2. No evidence of contrast entering the percutaneous drainage catheter in the LEFT lower quadrant. 3. Contrast flows antegrade into the cecum. These results will be called to the ordering clinician or representative by the Radiologist Assistant, and communication documented in the PACS or zVision Dashboard. Electronically Signed   By: Genevive Bi M.D.   On: 03/01/2016 16:16   Ir Gj Tube Change  Result Date: 03/18/2016 INDICATION: Ruptured gastrostomy balloon. Please perform fluoroscopic guided gastrojejunostomy catheter exchange. EXAM: FLUOROSCOPIC GUIDED REPLACEMENT OF GASTROJEJUNOSTOMY TUBE COMPARISON:  Gastrojejunostomy catheter injection - earlier same day MEDICATIONS: None. CONTRAST:  10 cc Isovue-300 FLUOROSCOPY TIME:  1 minute 6 seconds (4.9 mGy) COMPLICATIONS: None. PROCEDURE: Informed written consent was obtained from the patient after a discussion of the risks and benefits. The upper abdomen and the external portion of the existing gastrojejunostomy tube was prepped and draped in the usual sterile fashion, and a sterile drape was applied covering the operative field. Maximum barrier sterile technique with sterile gowns and gloves were used for the procedure. A timeout was performed prior to the initiation of the procedure. The jejunal lumen of the existing gastrojejunostomy  catheter was cannulated with a stiff Glidewire advanced into the proximal small bowel. Under intermittent fluoroscopic guidance, the existing gastric jejunostomy catheter was exchanged for a new 30  [place] GJ tube with tip ultimately terminating within the proximal small bowel. The gastrostomy balloon was inflated with saline and dilute contrast and pulled taut against the ventral wall of the stomach. The disc was cinched. Contrast injection via the jejunostomy and gastric lumens confirmed appropriate functioning and positioning. A dressing was placed. The patient tolerated procedure well without immediate postprocedural complication. IMPRESSION: Successful fluoroscopic guided replacement of 30 French gastrojejunostomy catheter. The tip of the jejunostomy lumen lies within the proximal jejunum. Both lumens ready for immediate use. Electronically Signed   By: Simonne Come M.D.   On: 03/18/2016 18:08   Ir Cm Inj Any Colonic Tube W/fluoro  Result Date: 03/22/2016 INDICATION: Inadvertent retraction of gastrojejunostomy catheter. EXAM: FLUOROSCOPIC GUIDED INJECTION OF GASTROJEJUNOSTOMY TUBE COMPARISON:  Fluoroscopic guided gastrojejunostomy catheter exchange - 03/18/2016 MEDICATIONS: None. CONTRAST:  20mL ISOVUE-300 IOPAMIDOL (ISOVUE-300) INJECTION 61% - administered into the stomach and small bowel FLUOROSCOPY TIME:  36 seconds (2 mGy) COMPLICATIONS: None. PROCEDURE: The patient was positioned supine on the fluoroscopy table. Contrast was injected via both the gastrostomy and jejunostomy lumens demonstrating appropriate position functionality of both lumens. The gastrostomy balloon is normally positioned against the ventral wall of the gastric lumen. Both lumens were flushed with a small amount of saline and capped. A dressing was placed. The patient tolerated procedure well without immediate postprocedural complication. IMPRESSION: Appropriately positioned and functioning and gastrojejunostomy catheter. No exchange  performed. Electronically Signed   By: Simonne Come M.D.   On: 03/22/2016 17:26   Ir Cm Inj Any Colonic Tube W/fluoro  Result Date: 03/18/2016 INDICATION: History of gastrojejunostomy catheter, now with concern for catheter dislodgement. EXAM: FLUOROSCOPIC GUIDED GASTROJEJUNOSTOMY TUBE INJECTION COMPARISON:  Small bowel series - 03/01/2016 MEDICATIONS: None. CONTRAST:  20 cc Isovue 300, administered via both the gastric and jejunal lumens FLUOROSCOPY TIME:  36 seconds (1 mGy) COMPLICATIONS: None. PROCEDURE: The patient was positioned supine on the fluoroscopy table. A preprocedural spot fluoroscopic image was obtained. Contrast injection of the gastric lumen demonstrates brisk opacification of the stomach lumen. Contrast injection of the gym lumen demonstrates brisk opacification of the small bowel. No evidence of catheter kink, fracture or contrast extravasation. A dressing was placed. The patient tolerated procedure well without immediate postprocedural complication. IMPRESSION: Appropriate positioned and functioning gastrojejunostomy tube. No exchange performed. Electronically Signed   By: Simonne Come M.D.   On: 03/18/2016 13:02         Labs:  Basic Metabolic Panel: BMP Latest Ref Rng & Units 03/28/2016 03/24/2016 03/21/2016  Glucose 65 - 99 mg/dL 161(W) 90 960(A)  BUN 6 - 20 mg/dL 54(U) 98(J) 19(J)  Creatinine 0.61 - 1.24 mg/dL 4.78 2.95(A) 2.13  Sodium 135 - 145 mmol/L 134(L) 137 134(L)  Potassium 3.5 - 5.1 mmol/L 4.1 3.9 4.4  Chloride 101 - 111 mmol/L 104 109 105  CO2 22 - 32 mmol/L 25 21(L) 22  Calcium 8.9 - 10.3 mg/dL 0.8(M) 7.4(L) 8.2(L)    CBC: CBC Latest Ref Rng & Units 03/28/2016 03/24/2016 03/21/2016  WBC 4.0 - 10.5 K/uL 6.8 7.1 8.6  Hemoglobin 13.0 - 17.0 g/dL 10.7(L) 8.5(L) 9.2(L)  Hematocrit 39.0 - 52.0 % 33.0(L) 26.9(L) 28.6(L)  Platelets 150 - 400 K/uL 321 284 338    CBG:  Recent Labs Lab 03/24/16 1146 03/24/16 1633 03/24/16 2023 03/25/16 0041 03/25/16 0342  GLUCAP 96 78  119* 120* 117*     Brief HPI:   [name] is a 76 year old male with history of CAD, chronic back pain,  COPD, GERD with dysphonia, PUD due to ulcer and abdominal pain who underwent distal gastrectomy with vagotomy 09/2015 due to acquired stricture. Post op course complicated by gastric ileus requiring jejunostomy, encephalopathy and FTT. He developed gastric leak due to jejunal disruption after pulling out his tube and required wash out with omentopexy 10/19, VDRF --tracheostomy, TPN as well as tube feeds and discharged to SNF for care and monitoring. He was readmitted on 01/19/16 due to abdominal pain with severe vomiting and unable to tolerate oral intake. He was found to have partial SBO treated conservatively without improvement and taken to OR on 12/23 for exp lap with lysis of adhesions, segmental SB resection and gastrojejunostomy.  He developed drainage of enteric contents from midline incision on 12/29 and was found to have LLQ abscess with EC fistula requiring percutaneous drain placed by IVR on 12/31. Drainage from EC fistula from midline incision managed with use of Eakin pouch. Intraabdominal wound cultures positive for moderate Klebsiella pneumoniae, moderate E coli and moderate bacteriodes thetaiotaomicron betalactamase positive. He was decannulated on 1/10 and antibiotics narrowed to rocephin for Klebsiella and E coli abscess. Dr. Michaell Cowing recommended bowel rest with TNA for nutritional support.  Mentation was improving with confusion resolving and ongoing anxiety/agitation at nights.  Patient was showing improvement in activity tolerance and mobility. CIR was recommended for follow up therapy.    Hospital Course: [name] was admitted to rehab 02/26/2016 for inpatient therapies to consist of PT, ST and OT at least three hours five days a week. Past admission physiatrist, therapy team and rehab RN have worked together to provide customized collaborative inpatient rehab. He was  maintained on rocephin thorough 01/17 and reactive leucocytosis has resolved. He was noted to be dehydrated and orthostatic therefore IVF was added to help with acute renal failure. He has had high levels of anxiety and insomnia. Family reported intolerance of haldol and this was changed to xanax at bedtime with good results.  Prozac was added for mood stabilization. Anxiety has resolved and mood has slowly improved with lot of ego support provided by team as well as his supportive family. Ventral abdominal wound is closing with significant decrease in output and wound measures 3 cm X 1 cm X 0.1 cm. He was maintained on TNA for nutritional support with daily labs and electrolyte abnormalities have been supplemented as needed.  He was started on trickle feeds via J-tube and had issues with nausea and abdominal pain. Erythromycin was added to help with gastroparesis and he  has tolerated slow advancement to 40 cc/hr for 16 hours.   Percutaneous LLQ drain was pulled without difficulty on 1/25 due to scant output. Drainage from G -tube has almost resolved and he tolerated since 2/12. Family educated on checking residuals from G -tube and to contact [facility] for input if residual >/+ 40 cc.  Gastrostomy balloon was found to be ruptured on 02/1 and GJ tube was replaced on 2/2. The tube did dislodge during therapy 2/6 but was repositioned and found to be appropriately positioned and functioning when examined under fluorsoscopy.  He was cleared for oral trials but has refused ice chips due to oral discomfort. Repeat MBS was performed on 2/9 and he showed timely swallow of thins. His anxiety levels regarding intake have improved and he is tolerating small sips by tsp with ST.  Follow up labs shows steady improvement in prealbumin to 31.1.  ABLA is improving with rise in H/H to 10.7/33.0. Blood sugars have been stable overall  with in blood sugar to 120's last pm with increase in rate of tube feeds. He has had improvement in  activity tolerance, mood as well as cognition. Extensive family education was done with family as they declined LTAC search and plan on hiring assist at home. He is currently at supervision to min assist level and will continue to receive follow up HHPT, HHOT, HHST, HHRN and HHaide by [facility] after discharge.    Rehab course: During patient's stay in rehab weekly team conferences were held to monitor patient's progress, set goals and discuss barriers to discharge. At admission, patient required max assist with mobility and total assist with basic self care tasks. He displayed moderate to sever cognitive deficits complicated by reduced speech intelligibility with MoCA Blind score 12/22. He has had improvement in activity tolerance, balance, postural control, as well as ability to compensate for deficits.  He is able to complete UB bathing and dressing with set up assist and needs min assist with LB ADLs with use of AE. He requires supervision to min assist for transfers and is able to ambulate 220' with RW and supervision. Speech intelligibility is improving with moderate cues for intelligibility at phrase level. He continues to demonstrate oropharyngeal dysphagia and needs max encouragement due to resistance with trials of thin liquids. He requires moderate assistance for semi-complex problem solving and higher level attention deficits.  Patient and wife have been educated on use of  Expiratory trainer as well as memory book. Family education was completed regarding all aspects of care as well as TNA/TF administration.   Disposition: Home  Diet: Nothing by mouth  Special Instructions: 1. Change Eakin's pouch weekly.  2. Needs to continue HEP at least twice a day. Needs to stay up for couple of hours 3 times a day and slowly increase time as over next few weeks.  3. Contact Dr. Michaell Cowing for input on wound care, tube feedings or change in GI status.   Discharge Instructions    Ambulatory  referral to Physical Medicine Rehab    Complete by:  As directed    One week follow up transitional care     Allergies as of 03/30/2016      Reactions   Flomax [tamsulosin Hcl] Other (See Comments)   Leg weakness   Lisinopril Cough   Lorazepam Other (See Comments)   "i don't like it"  Prefers Xanax or valium   Uroxatral [alfuzosin Hcl Er] Other (See Comments)   Leg weakness      Medication List    STOP taking these medications   albuterol (2.5 MG/3ML) 0.083% nebulizer solution Commonly known as:  PROVENTIL   alteplase 2 MG injection Commonly known as:  CATHFLO ACTIVASE   cefTRIAXone 2 g in dextrose 5 % 50 mL   diphenhydrAMINE 50 MG/ML injection Commonly known as:  BENADRYL Replaced by:  diphenhydrAMINE 12.5 MG/5ML elixir   enoxaparin 40 MG/0.4ML injection Commonly known as:  LOVENOX   haloperidol 0.5 MG tablet Commonly known as:  HALDOL   haloperidol lactate 5 MG/ML injection Commonly known as:  HALDOL   hydrALAZINE 20 MG/ML injection Commonly known as:  APRESOLINE   methocarbamol 500 mg in dextrose 5 % 50 mL   metoprolol 5 MG/5ML Soln injection Commonly known as:  LOPRESSOR   morphine 2 mg/mL injection Commonly known as:  MORPHINE   naloxone 0.4 MG/ML injection Commonly known as:  NARCAN   nicotine 7 mg/24hr patch Commonly known as:  NICODERM CQ - dosed in  mg/24 hr   ondansetron 4 MG/2ML Soln injection Commonly known as:  ZOFRAN   pantoprazole 40 MG injection Commonly known as:  PROTONIX   phenol 1.4 % Liqd Commonly known as:  CHLORASEPTIC   prochlorperazine 5 MG/ML injection Commonly known as:  COMPAZINE     TAKE these medications   Alprazolam 1 MG/ML Conc--Rx # 20 ml Take 0.5 mLs (0.5 mg total) by mouth at bedtime and may repeat dose one time if needed.   chlorhexidine 0.12 % solution Commonly known as:  PERIDEX 15 mLs by Mouth Rinse route 2 (two) times daily.   cycloSPORINE 0.05 % ophthalmic emulsion Commonly known as:  RESTASIS Place  1 drop into both eyes 2 (two) times daily.   diphenhydrAMINE 12.5 MG/5ML elixir Commonly known as:  BENADRYL Place 5 mLs (12.5 mg total) into feeding tube every 8 (eight) hours as needed for itching or sleep. Replaces:  diphenhydrAMINE 50 MG/ML injection   erythromycin ethylsuccinate 200 MG/5ML suspension Commonly known as:  EES Place 10 mLs (400 mg total) into feeding tube every 6 (six) hours.   fat emulsion 20 % infusion Inject 240 mLs into the vein continuous. What changed:  how much to take  when to take this   feeding supplement (JEVITY 1.2 CAL) Liqd Place 1,000 mLs into feeding tube daily.   FLUoxetine 20 MG/5ML solution Commonly known as:  PROZAC Place 5 mLs (20 mg total) into feeding tube daily.   insulin aspart 100 UNIT/ML injection Commonly known as:  novoLOG Inject 0-9 Units into the skin 4 (four) times daily.   lidocaine 5 % Commonly known as:  LIDODERM Apply to back at 7 pm and remove at 7 am daily   lip balm ointment Apply 1 application topically 2 (two) times daily.   liver oil-zinc oxide 40 % ointment Commonly known as:  DESITIN Apply topically 2 (two) times daily.   magic mouthwash Soln Take 15 mLs by mouth 4 (four) times daily as needed for mouth pain (sore throat).   menthol-cetylpyridinium 3 MG lozenge Commonly known as:  CEPACOL Take 1 lozenge (3 mg total) by mouth as needed for sore throat.   mouth rinse Liqd solution 15 mLs by Mouth Rinse route 2 times daily at 12 noon and 4 pm.   multivitamin Liqd Place 15 mLs into feeding tube daily. Start taking on:  03/31/2016   MUSCLE RUB 10-15 % Crea Apply 1 application topically 3 (three) times daily before meals.   nitroGLYCERIN 0.4 MG SL tablet Commonly known as:  NITROSTAT Place 1 tablet (0.4 mg total) under the tongue every 5 (five) minutes as needed for chest pain.   omeprazole 2 mg/mL Susp Commonly known as:  PRILOSEC Place 20 mLs (40 mg total) into feeding tube daily.   polyvinyl  alcohol 1.4 % ophthalmic solution Commonly known as:  LIQUIFILM TEARS Place 1 drop into both eyes every 8 (eight) hours as needed (dry).   prochlorperazine 25 MG suppository Commonly known as:  COMPAZINE Place 1 suppository (25 mg total) rectally every 12 (twelve) hours as needed for nausea or vomiting.   sodium chloride flush 0.9 % Soln Commonly known as:  NS 10-40 mLs by Intracatheter route as needed (flush). What changed:  Another medication with the same name was removed. Continue taking this medication, and follow the directions you see here.      Follow-up Information    Ankit Karis Juba, MD Follow up.   Specialty:  Physical Medicine and Rehabilitation Why:  office  will call you with follow up appointment Contact information: [address] [place] [address] [phone]        Ardeth SportsmanGROSS,STEVEN C., MD. Nyra CapesGo on 04/11/2016.   Specialty:  General Surgery Why:  Your appointment is 04/11/2016 at 11:45AM. Please arrive 15 minutes prior to your appointment to check in and fill out necessary paperwork. Contact information: [address] [place] [address] [phone]        Lillia MountainGRIFFIN,JOHN JOSEPH, MD Follow up on 04/11/2016.   Specialty:  Internal Medicine Why:  Appointment @ 2:45 PM--office to call you to reschedule appt Contact information: [address] [place] [address] [phone]           Signed: Jacquelynn CreeLove, Caprice Wasko S 03/30/2016, 4:53 PM

## 2016-03-21 NOTE — Progress Notes (Signed)
Occupational Therapy Session Note  Patient Details  Name: [name] MRN: [id number] Date of Birth: 1941-02-14  Today's Date: 03/21/2016 OT Individual Time: 1000-1100 OT Individual Time Calculation (min): 60 min    Short Term Goals: Week 4:  OT Short Term Goal 1 (Week 4): LTG=STG 2/2 estimated dc date  Skilled Therapeutic Interventions/Progress Updates:   Pt seen for skilled OT treatment addressing timed toileting, toilet transfers, activity tolerance, and modified bathing/dressing. Discussed time toileting again with pt and pt agreeable to transfer onto toilet even though he did not have the urge. Min A stand-pivot toilet transfers. Pt incontinent of small Bm, but able to finish BM on toilet. Pt attempted toileting today,but required assistance from OT for thoroughness. Pt continues to be reluctant to participate in self-care tasks, but is willing to with encouragement. Instructed on ADL AE today including reacher and sock-aid and pt able to demonstrate correct use to assist with LB dressing. Min A to stead sock-aid when placing sock; Min A sit<>stand with tactile and verbal cues to facilitate anterior weight shift while pulling up pants. Pt left seated in wc at end of session with needs met and PA present.   Therapy Documentation Precautions:  Precautions Precautions: Fall Precaution Comments: NPO, gastrostomy tube, LLQ JP drain, abdominal surgical incision with pouch Restrictions Weight Bearing Restrictions: No Pain:  denies pain  See Function Navigator for Current Functional Status.   Therapy/Group: Individual Therapy  Valma Cava 03/21/2016, 10:20 AM

## 2016-03-21 NOTE — Progress Notes (Signed)
Social Work Patient ID: [name], male   DOB: 19-Jul-1940, 76 y.o.   MRN: [id number]  Met with wife to discuss looking for NH bed for pt now medically more stable. She is here to observe him in therapies. Have faxed out [facility] and will begin contacting facilities. Pt requires much medical care and is a high cost pt with TPN, Tube feedings, drain and wound issues.

## 2016-03-21 NOTE — Progress Notes (Signed)
[place] PHYSICAL MEDICINE & REHABILITATION     PROGRESS NOTE  Subjective/Complaints: Pt seen laying in bed this AM.  He states he slept well and had a good weekend.  No issues reported per nursing tech.   ROS: Denies vomiting, diarrhea, shortness of breath or chest pain.  Objective: Vital Signs: Blood pressure 119/62, pulse 80, temperature 97.8 F (36.6 C), temperature source Oral, resp. rate 18, height 6\' 2"  (1.88 m), weight 69.1 kg (152 lb 5.4 oz), SpO2 98 %. No results found.  Recent Labs  03/21/16 0600  WBC 8.6  HGB 9.2*  HCT 28.6*  PLT 338    Recent Labs  03/20/16 0539 03/21/16 0643  NA 138 134*  K 3.6 4.4  CL 111 105  GLUCOSE 89 100*  BUN 26* 28*  CREATININE 0.63 0.63  CALCIUM 7.5* 8.2*   CBG (last 3)   Recent Labs  03/21/16 0029 03/21/16 0449 03/21/16 0811  GLUCAP 105* 121* 93    Wt Readings from Last 3 Encounters:  03/21/16 69.1 kg (152 lb 5.4 oz)  02/26/16 78.6 kg (173 lb 4.5 oz)  01/19/16 79.2 kg (174 lb 8 oz)    Physical Exam:  BP 119/62 (BP Location: Left Arm)   Pulse 80   Temp 97.8 F (36.6 C) (Oral)   Resp 18   Ht 6\' 2"  (1.88 m)   Wt 69.1 kg (152 lb 5.4 oz)   SpO2 98%   BMI 19.56 kg/m  Constitutional: NAD. Vital signs reviewed. Frail.  HENT: Normocephalic and atraumatic. +Stoma .  Eyes: EOMI. No discharge.  Cardiovascular: RRR. No JVD. Respiratory: CTA bilaterally. Unlabored.  GI: He exhibits no distension. BS+. +Eakins pouch. J tube without drainage . No pain to palpation                                                             Musculoskeletal: He exhibits no edema. He exhibits no tenderness.  Neurological: He is alert.  Speech with dysarthria Motor: 4/5 throughout (unchanged) Skin: Skin is warm and dry. Abdominal wounds with dressing.   Assessment/Plan: 1. Functional deficits secondary to debility and encephalopathy which require 3+ hours per day of interdisciplinary therapy in a comprehensive inpatient rehab  setting. Physiatrist is providing close team supervision and 24 hour management of active medical problems listed below. Physiatrist and rehab team continue to assess barriers to discharge/monitor patient progress toward functional and medical goals.  Function:  Bathing Bathing position Bathing activity did not occur: Refused Position: Wheelchair/chair at sink  Bathing parts Body parts bathed by patient: Right arm, Left arm, Chest, Front perineal area, Right upper leg, Right lower leg, Left upper leg, Left lower leg Body parts bathed by helper: Buttocks  Bathing assist Assist Level: Touching or steadying assistance(Pt > 75%), Set up   Set up : To obtain items, To open containers, To adjust water temperature  Upper Body Dressing/Undressing Upper body dressing Upper body dressing/undressing activity did not occur: Safety/medical concerns What is the patient wearing?: Pull over shirt/dress     Pull over shirt/dress - Perfomed by patient: Thread/unthread right sleeve, Thread/unthread left sleeve Pull over shirt/dress - Perfomed by helper: Pull shirt over trunk, Put head through opening        Upper body assist Assist Level: Touching or steadying assistance(Pt >  75%)      Lower Body Dressing/Undressing Lower body dressing Lower body dressing/undressing activity did not occur: Safety/medical concerns What is the patient wearing?: Pants, Ted Hose, Non-skid slipper socks     Pants- Performed by patient: Thread/unthread left pants leg Pants- Performed by helper: Thread/unthread right pants leg, Pull pants up/down, Thread/unthread left pants leg Non-skid slipper socks- Performed by patient: Don/doff right sock Non-skid slipper socks- Performed by helper: Don/doff right sock, Don/doff left sock   Socks - Performed by helper: Don/doff right sock, Don/doff left sock   Shoes - Performed by helper: Don/doff right shoe, Don/doff left shoe, Fasten right, Fasten left       TED Hose -  Performed by helper: Don/doff left TED hose, Don/doff right TED hose  Lower body assist Assist for lower body dressing: Touching or steadying assistance (Pt > 75%)      Toileting Toileting Toileting activity did not occur: No continent bowel/bladder event Toileting steps completed by patient: Adjust clothing prior to toileting Toileting steps completed by helper: Performs perineal hygiene, Adjust clothing after toileting Toileting Assistive Devices: Grab bar or rail  Toileting assist Assist level: Touching or steadying assistance (Pt.75%)   Transfers Chair/bed transfer   Chair/bed transfer method: Stand pivot Chair/bed transfer assist level: Touching or steadying assistance (Pt > 75%) Chair/bed transfer assistive device: Armrests, Patent attorney     Max distance: 100 Assist level: Supervision or verbal cues   Wheelchair   Type: Manual Max wheelchair distance: 90 ft Assist Level: Supervision or verbal cues  Cognition Comprehension Comprehension assist level: Follows basic conversation/direction with extra time/assistive device  Expression Expression assist level: Expresses basic 75 - 89% of the time/requires cueing 10 - 24% of the time. Needs helper to occlude trach/needs to repeat words.  Social Interaction Social Interaction assist level: Interacts appropriately with others with medication or extra time (anti-anxiety, antidepressant).  Problem Solving Problem solving assist level: Solves basic 75 - 89% of the time/requires cueing 10 - 24% of the time  Memory Memory assist level: Recognizes or recalls 75 - 89% of the time/requires cueing 10 - 24% of the time    Medical Problem List and Plan: 1. Debility and encephalopathy secondary to small bowel obstruction and associated complications   Cont CIR PT, OT, speech  Cont NPO   No crushed meds through feeding tube  Enterocutaneous fistula, limiting tube feedings to 30 cc/h, cont TPN  Last week and weekend notes  reviewed.   No issues per on-call physician. 2. DVT Prophylaxis/Anticoagulation: Pharmaceutical: Lovenox  3. Pain Management: IV morphine every 4 hours prn severe pain  4. Mood: team to provide ego support. LCSW to follow for evaluation and support.   Prozac started 1/18 5. Neuropsych: This patient is not fully capable of making decisions on his own behalf.  6. Skin/Wound Care: Continue air mattress overlay. Continue large Eakin's pouch to manage wound drainage. WOC for management. Encourage side lying to help manage MASD.  7. Malnutrition/Fluids/Electrolytes/Nutrition: continue TPN.   Albumin continues to improve on 2/5 8. Abdominal abscess: Leucocytosis resolving. Completed rocephin 1/17 9. Anemia of chronic illness:   Hb 9.2 on 2/5  Cont to monitor 10. Electrolyte abnormality/Acute renal insufficiency: Managed by TNA   Mag stable 2/5 11. Encephalopathy: Changed haldol to IM prn for management of agitation/anxiety.  12. Hyperglycemia  Overall controlled 2/5 13 Sleep disturbance  Has tried Haldol without benefit, ativan causes hallucinations  Continue Xanax elixer,  Improved 14. Mild hyponatremia  Na+ 134 on 2/5     LOS (Days) 24 A FACE TO FACE EVALUATION WAS PERFORMED  Ankit Karis Juba 03/21/2016 8:22 AM

## 2016-03-21 NOTE — Progress Notes (Signed)
Speech Language Pathology Daily Session Note  Patient Details  Name: Danny GalRalph T Saline MRN: [id number] Date of Birth: Jul 10, 1940  Today's Date: 03/21/2016 SLP Individual Time: 1445-1530 SLP Individual Time Calculation (min): 45 min  Short Term Goals: Week 3: SLP Short Term Goal 1 (Week 3): Pt will utilize speech intelligibility strategies at the sentence level with Min A verbal cues to achieve  ~50% intelligibility.  SLP Short Term Goal 2 (Week 3): Pt will complete semi-complex tasks with Mod A verbal cues. SLP Short Term Goal 3 (Week 3): Pt will sustain attention to functional tasks for ~ 30 minutes with Min A verbal cues.  SLP Short Term Goal 4 (Week 3): Pt will utilize external memory aids to recall new, daily information with Min A verbal cues.  SLP Short Term Goal 5 (Week 3): Pt will consistently demonstrate O x 4 with supervision verbal cues.  SLP Short Term Goal 6 (Week 3): Pt will consume therapeutic trials of ice chips with minimal overt s/s of aspiration over 2 consecutive sessions to demonstrate readiness for repeat MBS.   Skilled Therapeutic Interventions: Skilled treatment session focused on speech and dysphagia goals. SLP facilitated session by providing Mod A verbal cues to utilize memory aides to recall orientation information. Pt required Mod A verbal cues to recall date even with use of aides. Pt able to utilize increased vocal intensity at the phrase level for ~ 75% intelligibility and ~25% at the sentence level with Min A cues. Pt refused trials of PO but able to implement several set of effortful swallows (3 reps each set) with Min A verbal cues. Pt left upright in wheelchair without safety belt (implementing trials without safety belt), wife and daughter present and nursing aware to monitor pt. All needs were within reach. Continue with current plan of care.      Function:  Eating Eating Eating activity did not occur: Safety/medical concerns (total assist)                Cognition Comprehension Comprehension assist level: Follows basic conversation/direction with extra time/assistive device  Expression   Expression assist level: Expresses basic 75 - 89% of the time/requires cueing 10 - 24% of the time. Needs helper to occlude trach/needs to repeat words.  Social Interaction Social Interaction assist level: Interacts appropriately with others with medication or extra time (anti-anxiety, antidepressant).  Problem Solving Problem solving assist level: Solves basic 75 - 89% of the time/requires cueing 10 - 24% of the time  Memory Memory assist level: Recognizes or recalls 75 - 89% of the time/requires cueing 10 - 24% of the time    Pain    Therapy/Group: Individual Therapy  Heddy Vidana B. Dreama Saaverton, M.S., CCC-SLP Speech-Language Pathologist  Karim Aiello 03/21/2016, 3:45 PM

## 2016-03-21 NOTE — Progress Notes (Signed)
PHARMACY - TOTAL PARENTERAL NUTRITION CONSULT NOTE   Pharmacy Consult:  TPN Indication:  SBO and new EC fistula 02/13/16  Patient Measurements: Height: 6' 2"  (188 cm) Weight: 152 lb 5.4 oz (69.1 kg) IBW/kg (Calculated) : 82.2   Body mass index is 19.56 kg/m.  Assessment:  73 YOM with history of multiple procedures and extended hospitalization for recurrent partial GOO 2/2 pyloric stricture.  Patient was on TPN for several days, then discharged on 12/29/16 with J/G tube in place for TF. He was unable to tolerate TF and was transitioned back to TPN by [facility] on 01/05/17.  Patient was readmitted on 01/19/17 with partial SBO.  Patient failed MBS on 01/26/16 and did not tolerate bolus feeds through G-tube on 01/27/16. On 12/23, he underwent ex-lap with LoA, segmental SBR, and gastrojejunostomy. On 02/12/16, TF were found draining from midline incision with CT abd showing bowel perforation at [place] site and development of abscesses.   GI: EC fistula at SB jejunal anastomosis which is decreasing in size. Has had trouble advancing TFs so had been advancing very slowly per surgery. On 2/2 GJ tube needed to be replaced so TFs were held. Now TFs have been restarted and are running at 23m/hr. Surgery plans to leave at 345mhr until fistula closes. Albumin low at 1.9, prealbumin ok at 28.9. Drain output remains high at > 1 L yesterday. Endo: no hx DM. CBGs 80-120s. No SSI  Lytes: Na slightly low at 134, CoCa 9.9. Mg and Phos WNL. Renal: SCr and BUN stable. NS at 10043mr x 10 hrs overnight Pulm: RA - nicotine patch Cards: orthostatic hypotension improved - VSS Hepatobil:AST and Alk Phos slightly elevated. ALT and Tbilli wnl. TG good at 131 Neuro: Prozac, Xanax, lidocaine patch, PRN morphine, APAP TID Heme/Onc: S/p Iron dextran ID: S/p ceftriaxone for UTI. UCx negative. Afebrile, WBC wnl Best Practices: Lovenox, MC TPN Access: PICC TPN start date: [facility] > [facility] 01/20/16 > [facility] 02/26/15  Home TPN: 2156 kCal and  75gm protein per day (405g CHO = 1376 kCal, 50g lipid = 480 kCal)  Nutritional Goals: per RD recs on 1/31 Kcal: 2100-2300 Protein: 95-110 g  Current Nutrition:  Clinimix E 5/15 + lipids Jevity 1.2 at 79m21m for 16 hrs (576 kcal and 27 g of protein) NPO except ice chips  Plan: Continue cyclic Clinimix E 5/152/80fuse 1560 ml over 12 hrs: 50 mL/hr x 1 hr, then 146 mL/hr x 10 hrs, then 50 mL/hr x 1 hr. Continue IV lipid emulsions 20ml57mover 12 hrs to help meet kcal goal since TG ok Continue Jevity 1.2 at 79ml/53mor 16 hrs per surgery TPN, IV lipid emulsions, and TFs provides 105 g of protein and 2164 kCals per day meeting 100% of protein and kCal needs Add MVI  in TPN Add TE (every other day) in TPN - added 2/5 Monitor TPN labs, Bmet (next planned is Thursday) F/U tolerance of TFs, surgery plans  MichaeLevester FreshmD, BCPS, BCCCP Clinical Pharmacist Clinical phone for 03/21/2016 from 7a-3:30p: [phone] 3:30p, please call main pharmacy at: [phone] 03/21/2016 8:02 AM

## 2016-03-21 NOTE — Progress Notes (Signed)
[facility] Progress Note     Subjective: Pt is feeling good. No abdominal pain. No new complaints.   Objective: Vital signs in last 24 hours: Temp:  [97.8 F (36.6 C)-98.5 F (36.9 C)] 97.8 F (36.6 C) (02/05 0449) Pulse Rate:  [75-80] 80 (02/05 0449) Resp:  [18] 18 (02/05 0449) BP: (119)/(62) 119/62 (02/04 1355) SpO2:  [98 %] 98 % (02/05 0449) Weight:  [152 lb 5.4 oz (69.1 kg)] 152 lb 5.4 oz (69.1 kg) (02/05 0449) Last BM Date: 03/20/16  Intake/Output from previous day: 02/04 0701 - 02/05 0700 In: 675 [I.V.:450; NG/GT:225] Out: 2200 [Urine:725; Drains:1475] Intake/Output this shift: Total I/O In: 90 [NG/GT:90] Out: 300 [Urine:300]  PE: Gen:  Alert, NAD, pleasant, cooperative, sitting up in wheelchair, just finished PT Card:  RRR, no M/G/R heard Pulm:  Rate and effort normal Abd: Soft, nondistended, +BS, EC fistula much smaller with scant discharge in bad. G tube in place  Skin: no rashes noted, warm and dry, not diaphoretic  Lab Results:   Recent Labs  03/21/16 0600  WBC 8.6  HGB 9.2*  HCT 28.6*  PLT 338   BMET  Recent Labs  03/20/16 0539 03/21/16 0643  NA 138 134*  K 3.6 4.4  CL 111 105  CO2 22 22  GLUCOSE 89 100*  BUN 26* 28*  CREATININE 0.63 0.63  CALCIUM 7.5* 8.2*   PT/INR No results for input(s): LABPROT, INR in the last 72 hours. CMP     Component Value Date/Time   NA 134 (L) 03/21/2016 0643   NA 139 01/14/2016   K 4.4 03/21/2016 0643   CL 105 03/21/2016 0643   CO2 22 03/21/2016 0643   GLUCOSE 100 (H) 03/21/2016 0643   BUN 28 (H) 03/21/2016 0643   BUN 23 (A) 01/14/2016   CREATININE 0.63 03/21/2016 0643   CALCIUM 8.2 (L) 03/21/2016 0643   PROT 5.9 (L) 03/21/2016 0643   ALBUMIN 1.9 (L) 03/21/2016 0643   AST 47 (H) 03/21/2016 0643   ALT 50 03/21/2016 0643   ALKPHOS 204 (H) 03/21/2016 0643   BILITOT 0.4 03/21/2016 0643   GFRNONAA >60 03/21/2016 0643   GFRAA >60 03/21/2016 0643   Lipase     Component Value  Date/Time   LIPASE 55 (H) 01/20/2016 0158       Studies/Results: No results found.  Anti-infectives: Anti-infectives    Start     Dose/Rate Route Frequency Ordered Stop   03/16/16 2200  erythromycin ethylsuccinate (EES) 200 MG/5ML suspension 200 mg    Comments:  Give via G tube   200 mg Per Tube Every 8 hours 03/16/16 1746     03/14/16 2000  erythromycin ethylsuccinate (EES) 200 MG/5ML suspension 200 mg  Status:  Discontinued    Comments:  Give via G tube   200 mg Per Tube Every 12 hours 03/14/16 0805 03/16/16 1746   03/08/16 1730  erythromycin ethylsuccinate (EES) 200 MG/5ML suspension 200 mg  Status:  Discontinued     200 mg Per Tube Every 8 hours 03/08/16 1634 03/14/16 0805   02/29/16 2000  cefTRIAXone (ROCEPHIN) 2 g in dextrose 5 % 50 mL IVPB     2 g 100 mL/hr over 30 Minutes Intravenous Every 24 hours 02/29/16 1804 03/02/16 2017   02/27/16 2000  cefTRIAXone (ROCEPHIN) 2 g in dextrose 5 % 50 mL IVPB  Status:  Discontinued     2 g 100 mL/hr over 30 Minutes Intravenous Every 24 hours 02/26/16 2137 02/29/16 1804  Assessment/Plan  [facility] clinically improving, much smaller wound/pouch and lower output - continues to tolerate continuous TF's at5730mL/hr - perc drain removed 1/25 - continue only liquid medications via J tube and no crushed meds - continue G-tube to gravity  FEN - NPO except for ice chips, IVF, TPN, TF at night VTE - Lovenox   Plan - tolerating increased tube feeds, now at 4330mL/hr with a goal of 4165mL/hr for 20 hours. Continue TPN until close to the TF goal. GJtube replaced 03/18/16.   Per Dr. Michaell CowingGross: Once the fistula is fully closed and no leaking from his incision, advance tube feeds to goal.  I am inclined to leave it at 30 mL an hour until the fistula is fully closed.  Then advanced to goal. Then G tube clamping trials.  We will continue to follow.   LOS: 24 days    Jerre SimonJessica L Darral Rishel , [facility] 03/21/2016, 10:57 AM Pager: [phone] Consults: [phone] Mon-Fri 7:00 am-4:30 pm Sat-Sun 7:00 am-11:30 am

## 2016-03-22 ENCOUNTER — Inpatient Hospital Stay (HOSPITAL_COMMUNITY): Payer: Medicare Other | Admitting: Speech Pathology

## 2016-03-22 ENCOUNTER — Inpatient Hospital Stay (HOSPITAL_COMMUNITY): Payer: Medicare Other | Admitting: Physical Therapy

## 2016-03-22 ENCOUNTER — Encounter (HOSPITAL_COMMUNITY): Payer: Self-pay | Admitting: Interventional Radiology

## 2016-03-22 ENCOUNTER — Inpatient Hospital Stay (HOSPITAL_COMMUNITY): Payer: Medicare Other | Admitting: Occupational Therapy

## 2016-03-22 ENCOUNTER — Inpatient Hospital Stay (HOSPITAL_COMMUNITY): Payer: Medicare Other

## 2016-03-22 HISTORY — PX: IR GENERIC HISTORICAL: IMG1180011

## 2016-03-22 LAB — GLUCOSE, CAPILLARY
GLUCOSE-CAPILLARY: 112 mg/dL — AB (ref 65–99)
GLUCOSE-CAPILLARY: 87 mg/dL (ref 65–99)
Glucose-Capillary: 112 mg/dL — ABNORMAL HIGH (ref 65–99)
Glucose-Capillary: 91 mg/dL (ref 65–99)
Glucose-Capillary: 94 mg/dL (ref 65–99)
Glucose-Capillary: 95 mg/dL (ref 65–99)

## 2016-03-22 MED ORDER — ACETAMINOPHEN 160 MG/5ML PO SOLN
650.0000 mg | Freq: Three times a day (TID) | ORAL | Status: DC
  Administered 2016-03-23 – 2016-03-28 (×15): 650 mg
  Filled 2016-03-22 (×15): qty 20.3

## 2016-03-22 MED ORDER — CLINIMIX E/DEXTROSE (5/15) 5 % IV SOLN
INTRAVENOUS | Status: AC
  Administered 2016-03-22: 18:00:00 via INTRAVENOUS
  Filled 2016-03-22: qty 1560

## 2016-03-22 MED ORDER — IOPAMIDOL (ISOVUE-300) INJECTION 61%
INTRAVENOUS | Status: AC
  Administered 2016-03-22: 20 mL
  Filled 2016-03-22: qty 50

## 2016-03-22 MED ORDER — DIPHENHYDRAMINE HCL 12.5 MG/5ML PO ELIX: 12.5000 mg | ORAL_SOLUTION | Freq: Three times a day (TID) | ORAL | Status: DC | PRN

## 2016-03-22 MED ORDER — ACETAMINOPHEN 160 MG/5ML PO SOLN
650.0000 mg | Freq: Three times a day (TID) | ORAL | Status: DC
  Administered 2016-03-22: 650 mg
  Filled 2016-03-22: qty 20.3

## 2016-03-22 MED ORDER — MORPHINE SULFATE 10 MG/5ML PO SOLN: 2.5000 mg | ORAL | Status: DC | PRN

## 2016-03-22 MED ORDER — FAT EMULSION 20 % IV EMUL
240.0000 mL | INTRAVENOUS | Status: AC
  Administered 2016-03-22: 240 mL via INTRAVENOUS
  Filled 2016-03-22: qty 250

## 2016-03-22 NOTE — Procedures (Signed)
Appropriately positioned and functioning GJ tube. No exchange performed. Both lumens are ready for immediate use.  Katherina RightJay Yiannis Tulloch, MD Pager #: [phone]

## 2016-03-22 NOTE — Progress Notes (Signed)
Physical Therapy Session Note  Patient Details  Name: MAXIMILIEN HAYASHI MRN: [id number] Date of Birth: [birth year]  Today's Date: 03/22/2016 PT Individual Time: 1300-1330 PT Individual Time Calculation (min): 30 min   Short Term Goals: Week 4:  PT Short Term Goal 1 (Week 4): = LTG due to estimated LOS  Skilled Therapeutic Interventions/Progress Updates: Pt presented in bed, performed bed mobility with minA for truncal support. MinA sit to stand from bed with cues for forefoot wt shifting upon standing. Ambulated 161f with RW with cues for posture. Decreased bilateral foot clearance noted. Pt with c/o L calf discomfort/soreness. No heat redness noted nor tender to touch. Performed manual gastroc/heel cord stretch 30x 3 with improved results per pt. Pt returned to room requesting to return to bed. Transferred in same manner as previous. Pt left in bed with alarm on and all current needs met.      Therapy Documentation Precautions:  Precautions Precautions: Fall Precaution Comments: NPO, gastrostomy tube, LLQ JP drain, abdominal surgical incision with pouch Restrictions Weight Bearing Restrictions: No   See Function Navigator for Current Functional Status.   Therapy/Group: Individual Therapy  Malikah Principato  Shayne Deerman, PTA  03/22/2016, 4:04 PM

## 2016-03-22 NOTE — Progress Notes (Signed)
Occupational Therapy Session Note  Patient Details  Name: VERLIE HELLENBRAND MRN: [id number] Date of Birth: 07/25/1940  Today's Date: 03/22/2016 OT Individual Time: 0900-1000 OT Individual Time Calculation (min): 60 min    Short Term Goals: Week 4:  OT Short Term Goal 1 (Week 4): LTG=STG 2/2 estimated dc date  Skilled Therapeutic Interventions/Progress Updates:     1:1 OT session focused on sit<>stand, functional transfers, improved activity tolerance, and increased independence with LB self-care. TED hose donned with max A while supine in bed, then pt ambulated to bathroom with Rw, Min A, +mod A for turning to transfer onto toilet 2/2 posterior LOB. Pt unaware of incontinence in brief, able to void more into commode, then completed toileting with OT providing assistance for thoroughness. UB dressing completed w/ set-up A. Then addressed LB dressing and problem solving using reacher. Pt required multiple rest breaks and questioning cues to prompt problem solving on how to thread LE's through pant legs using reacher. Sit<>stand with min A. Required encouragement to attempt pulling pants over hips which pt did with min A and min guard A for balance. After extended rest break, pt then ambulated from sink>recliner on opposite side of room w/ RW and Min A. Pt left seated in recliner at end of session and left with needs met and call bell within reach.   Therapy Documentation Precautions:  Precautions Precautions: Fall Precaution Comments: NPO, gastrostomy tube, LLQ JP drain, abdominal surgical incision with pouch Restrictions Weight Bearing Restrictions: No Pain:  None/denies pain  See Function Navigator for Current Functional Status.   Therapy/Group: Individual Therapy  Valma Cava 03/22/2016, 9:28 AM

## 2016-03-22 NOTE — Consult Note (Signed)
WOC by to check status of small Eakin's pouch, continues to work very well for management of EC fistula.  Will plan Q week changes on Thursdays. Supplies in the room should bedside nurses need, image in chart of placement of barrier rings and eakin strip prior to placement of pouch.   Patient in good spirits this am.  WOC Nurse team will follow along with you for weekly wound assessments.  Please notify me of any acute changes in the wounds or any new areas of concerns Kiri Hinderliter [facility] MSN, RN,CWOCN, CNS [phone]

## 2016-03-22 NOTE — Progress Notes (Signed)
GJ tube was dislodged during PT. Gastric tube and Bulb out with part of jejunostomy tube still in. Area covered and taped in place. Radiology and surgery contacted. Have put in order for radiology to replace tube. Family updated

## 2016-03-22 NOTE — Progress Notes (Signed)
Gastro-jejunostomy tube dislodged again.  Balloon was intact and outside of the stomach. Tip of the J tube was in the tract.   I deflated it and advanced it back into the stomach, then re inflated the balloon..  IR will need to reposition the tube and then may need to sew in place.

## 2016-03-22 NOTE — Progress Notes (Signed)
[facility]IT[facility]     PROGRESS NOTE  Subjective/Complaints: Pt laying in bed this AM. He slept well overnight.  He appears to have dried blood in his stoma, spoke with nursing regarding removal.   ROS: Denies vomiting, diarrhea, shortness of breath or chest pain.  Objective: Vital Signs: Blood pressure (!) 144/63, pulse 79, temperature 97.4 F (36.3 C), temperature source Oral, resp. rate 16, height 6\' 2"  (1.88 m), weight 69.3 kg (152 lb 12.5 oz), SpO2 100 %. No results found.  Recent Labs  03/21/16 0600  WBC 8.6  HGB 9.2*  HCT 28.6*  PLT 338    Recent Labs  03/20/16 0539 03/21/16 0643  NA 138 134*  K 3.6 4.4  CL 111 105  GLUCOSE 89 100*  BUN 26* 28*  CREATININE 0.63 0.63  CALCIUM 7.5* 8.2*   CBG (last 3)   Recent Labs  03/22/16 0008 03/22/16 0358 03/22/16 0804  GLUCAP 112* 112* 95    Wt Readings from Last 3 Encounters:  03/22/16 69.3 kg (152 lb 12.5 oz)  02/26/16 78.6 kg (173 lb 4.5 oz)  01/19/16 79.2 kg (174 lb 8 oz)    Physical Exam:  BP (!) 144/63 (BP Location: Left Arm)   Pulse 79   Temp 97.4 F (36.3 C) (Oral)   Resp 16   Ht 6\' 2"  (1.88 m)   Wt 69.3 kg (152 lb 12.5 oz)   SpO2 100%   BMI 19.62 kg/m  Constitutional: NAD. Vital signs reviewed. Frail.  HENT: Normocephalic and atraumatic. +Stoma .  Eyes: EOMI. No discharge.  Cardiovascular: RRR. No JVD. Respiratory: CTA bilaterally. Unlabored.  GI: He exhibits no distension. BS+. +Eakins pouch. J tube without drainage . No pain to palpation                                                             Musculoskeletal: He exhibits no edema. He exhibits no tenderness.  Neurological: He is alert.  Speech with dysarthria Motor: 4/5 throughout (stable) Skin: Skin is warm and dry. Abdominal wounds with dressing.   Assessment/Plan: 1. Functional deficits secondary to debility and encephalopathy which require 3+ hours per day of interdisciplinary therapy in a comprehensive  inpatient rehab setting. Physiatrist is providing close team supervision and 24 hour management of active medical problems listed below. Physiatrist and rehab team continue to assess barriers to discharge/monitor patient progress toward functional and medical goals.  Function:  Bathing Bathing position Bathing activity did not occur: Refused Position: Wheelchair/chair at sink  Bathing parts Body parts bathed by patient: Right arm, Left arm, Chest, Right upper leg, Left upper leg, Left lower leg, Right lower leg Body parts bathed by helper: Buttocks, Front perineal area  Bathing assist Assist Level: Touching or steadying assistance(Pt > 75%), Set up   Set up : To obtain items  Upper Body Dressing/Undressing Upper body dressing Upper body dressing/undressing activity did not occur: Safety/medical concerns What is the patient wearing?: Pull over shirt/dress     Pull over shirt/dress - Perfomed by patient: Thread/unthread right sleeve, Put head through opening, Thread/unthread left sleeve, Pull shirt over trunk Pull over shirt/dress - Perfomed by helper: Pull shirt over trunk, Put head through opening        Upper body assist Assist Level: Set  up   Set up : To obtain clothing/put away  Lower Body Dressing/Undressing Lower body dressing Lower body dressing/undressing activity did not occur: Safety/medical concerns What is the patient wearing?: Pants, Non-skid slipper socks, Ted Hose     Pants- Performed by patient: Thread/unthread right pants leg, Thread/unthread left pants leg Pants- Performed by helper: Pull pants up/down Non-skid slipper socks- Performed by patient: Don/doff left sock, Don/doff right sock Non-skid slipper socks- Performed by helper: Don/doff right sock, Don/doff left sock   Socks - Performed by helper: Don/doff right sock, Don/doff left sock   Shoes - Performed by helper: Don/doff right shoe, Don/doff left shoe, Fasten right, Fasten left       TED Hose -  Performed by helper: Don/doff right TED hose, Don/doff left TED hose  Lower body assist Assist for lower body dressing: Touching or steadying assistance (Pt > 75%)      Toileting Toileting Toileting activity did not occur: No continent bowel/bladder event Toileting steps completed by patient: Adjust clothing prior to toileting Toileting steps completed by helper: Adjust clothing prior to toileting, Adjust clothing after toileting, Performs perineal hygiene Toileting Assistive Devices: Grab bar or rail  Toileting assist Assist level: Touching or steadying assistance (Pt.75%)   Transfers Chair/bed transfer   Chair/bed transfer method: Ambulatory Chair/bed transfer assist level: Touching or steadying assistance (Pt > 75%) Chair/bed transfer assistive device: Environmental consultantWalker, Designer, fashion/clothingArmrests     Locomotion Ambulation     Max distance: 225 Assist level: Supervision or verbal cues   Wheelchair   Type: Manual Max wheelchair distance: 90 ft Assist Level: Supervision or verbal cues  Cognition Comprehension Comprehension assist level: Follows basic conversation/direction with extra time/assistive device  Expression Expression assist level: Expresses basic 75 - 89% of the time/requires cueing 10 - 24% of the time. Needs helper to occlude trach/needs to repeat words.  Social Interaction Social Interaction assist level: Interacts appropriately with others with medication or extra time (anti-anxiety, antidepressant).  Problem Solving Problem solving assist level: Solves basic 75 - 89% of the time/requires cueing 10 - 24% of the time  Memory Memory assist level: Recognizes or recalls 75 - 89% of the time/requires cueing 10 - 24% of the time    Medical Problem List and Plan: 1. Debility and encephalopathy secondary to small bowel obstruction and associated complications   Cont CIR  Cont NPO   No crushed meds through feeding tube  Enterocutaneous fistula, limiting tube feedings to 30 cc/h, cont TPN  Last  week and weekend notes reviewed.   No issues per on-call physician. 2. DVT Prophylaxis/Anticoagulation: Pharmaceutical: Lovenox  3. Pain Management: IV morphine every 4 hours prn severe pain  4. Mood: team to provide ego support. LCSW to follow for evaluation and support.   Prozac started 1/18 5. Neuropsych: This patient is not fully capable of making decisions on his own behalf.  6. Skin/Wound Care: Continue air mattress overlay. Continue large Eakin's pouch to manage wound drainage. WOC for management. Encourage side lying to help manage MASD.  7. Malnutrition/Fluids/Electrolytes/Nutrition: continue TPN.   Albumin continues to improve on 2/5 8. Abdominal abscess: Leucocytosis resolving. Completed rocephin 1/17 9. Anemia of chronic illness:   Hb 9.2 on 2/5  Cont to monitor 10. Electrolyte abnormality/Acute renal insufficiency: Managed by TNA   Mag stable 2/5 11. Encephalopathy: Changed haldol to IM prn for management of agitation/anxiety.  12. Hyperglycemia  Overall controlled 2/6 13 Sleep disturbance  Has tried Haldol without benefit, ativan causes hallucinations  Continue Xanax elixer,  Improved 14. Mild hyponatremia  Na+ 134 on 2/5     LOS (Days) 25 A FACE TO FACE EVALUATION WAS PERFORMED  Soraya Paquette Karis Juba 03/22/2016 8:38 AM

## 2016-03-22 NOTE — Progress Notes (Signed)
PHARMACY - TOTAL PARENTERAL NUTRITION CONSULT NOTE   Pharmacy Consult:  TPN Indication:  SBO and new EC fistula 02/13/16  Patient Measurements: Height: _0  (188 cm) Weight: 152 lb 12.5 oz (69.3 kg) IBW/kg (Calculated) : 82.2   Body mass index is 19.62 kg/m.  Assessment:  [name] with history of multiple procedures and extended hospitalization for recurrent partial GOO 2/2 pyloric stricture.  Patient was on TPN for several days, then discharged on 12/29/16 with J/G tube in place for TF. He was unable to tolerate TF and was transitioned back to TPN by [facility] on 01/05/17.  Patient was readmitted on 01/19/17 with partial SBO.  Patient failed MBS on 01/26/16 and did not tolerate bolus feeds through G-tube on 01/27/16. On 12/23, he underwent ex-lap with LoA, segmental SBR, and gastrojejunostomy. On 02/12/16, TF were found draining from midline incision with CT abd showing bowel perforation at [place] site and development of abscesses.   GI: EC fistula at SB jejunal anastomosis which is decreasing in size. Has had trouble advancing TFs so had been advancing very slowly per surgery. On 2/2 GJ tube needed to be replaced so TFs were held. Now TFs have been restarted and are running at 53m/hr. Surgery plans to leave at 377mhr until fistula closes. Albumin low at 1.9, prealbumin ok at 28.9. Drain output remains high at > 1 L yesterday. Endo: no hx DM. CBGs 80-120s. No SSI  Lytes: Na slightly low at 134, CoCa 9.9. Mg and Phos WNL (from 2/5) Renal: SCr and BUN stable. NS at 10021mr x 10 hrs overnight Pulm: RA - nicotine patch Cards: orthostatic hypotension improved - VSS Hepatobil:AST and Alk Phos slightly elevated. ALT and Tbilli wnl. TG good at 131 (2/5) Neuro: Prozac, Xanax, lidocaine patch, PRN morphine, APAP TID Heme/Onc: S/p Iron dextran ID: S/p ceftriaxone for UTI. UCx negative. Afebrile, WBC wnl Best Practices: Lovenox, MC TPN Access: PICC TPN start date: P[facility] > [facility] 01/20/16 > [facility] 02/26/15  Home  TPN: 2156 kCal and 75gm protein per day (405g CHO = 1376 kCal, 50g lipid = 480 kCal)  Nutritional Goals: per RD recs on 1/31 Kcal: 2100-2300 Protein: 95-110 g  Current Nutrition:  Clinimix E 5/15 + lipids Jevity 1.2 at 75m41m for 16 hrs (576 kcal and 27 g of protein) NPO except ice chips  Plan: Continue cyclic Clinimix E 5/153/38fuse 1560 ml over 12 hrs: 50 mL/hr x 1 hr, then 146 mL/hr x 10 hrs, then 50 mL/hr x 1 hr. Continue IV lipid emulsions 20ml45mover 12 hrs to help meet kcal goal since TG ok Continue Jevity 1.2 at 75ml/25mor 16 hrs per surgery TPN, IV lipid emulsions, and TFs provides 105 g of protein and 2164 kCals per day meeting 100% of protein and kCal needs Add MVI  in TPN Add TE (every other day) in TPN - due 2/7 Monitor TPN labs, Bmet (next planned is Thursday) F/U tolerance of TFs, surgery plans  MichaeLevester FreshmD, BCPS, BCCCP Clinical Pharmacist Clinical phone for 03/22/2016 from 7a-3:30p: [phone] 3:30p, please call main pharmacy at: [phone] 03/22/2016 7:41 AM

## 2016-03-22 NOTE — Progress Notes (Signed)
Speech Language Pathology Weekly Progress and Session Note  Patient Details  Name: [name] MRN: [id number] Date of Birth: November 26, 1940  Beginning of progress report period: March 15, 2016 End of progress report period: March 22, 2016  Today's Date: 03/22/2016 SLP Individual Time: 7353-2992 SLP Individual Time Calculation (min): 45 min  Short Term Goals: Week 3: SLP Short Term Goal 1 (Week 3): Pt will utilize speech intelligibility strategies at the sentence level with Min A verbal cues to achieve  ~50% intelligibility.  SLP Short Term Goal 1 - Progress (Week 3): Met SLP Short Term Goal 2 (Week 3): Pt will complete semi-complex tasks with Mod A verbal cues. SLP Short Term Goal 2 - Progress (Week 3): Met SLP Short Term Goal 3 (Week 3): Pt will sustain attention to functional tasks for ~ 30 minutes with Min A verbal cues.  SLP Short Term Goal 3 - Progress (Week 3): Met SLP Short Term Goal 4 (Week 3): Pt will utilize external memory aids to recall new, daily information with Min A verbal cues.  SLP Short Term Goal 4 - Progress (Week 3): Not met SLP Short Term Goal 5 (Week 3): Pt will consistently demonstrate O x 4 with supervision verbal cues.  SLP Short Term Goal 5 - Progress (Week 3): Discontinued (comment) (has not been able to meet with Max cues over several reporting periods) SLP Short Term Goal 6 (Week 3): Pt will consume therapeutic trials of ice chips with minimal overt s/s of aspiration over 2 consecutive sessions to demonstrate readiness for repeat MBS.  SLP Short Term Goal 6 - Progress (Week 3): Not met    New Short Term Goals: Week 4: SLP Short Term Goal 1 (Week 4): Pt will utilize speech intelligibility strategies at the sentence level with Min A verbal cues to achieve  ~90% intelligibility.  SLP Short Term Goal 2 (Week 4): Pt will complete semi-complex tasks with Min A verbal cues. SLP Short Term Goal 3 (Week 4): Pt will sustain attention to functional tasks for ~ 30  minutes with supervision cues.  SLP Short Term Goal 4 (Week 4): Pt will utilize external memory aids to recall new, daily information with Min A verbal cues.  SLP Short Term Goal 5 (Week 4): Pt will use memory aide to recall informaiton from previous therapy sessions with Min A verbal cues.  SLP Short Term Goal 6 (Week 4): Pt will consume therapeutic trials of ice chips with minimal overt s/s of aspiration over 2 consecutive sessions to demonstrate readiness for repeat MBS.   Weekly Progress Updates: Pt has made good functional progress this reporting period as has met 3 out of 6 STG's. Pt demonstrates improved speech intelligibility d/t increased vocal intensity at the sentence level, improved sustained attention and completion of semi-complex problems. Dysphagia has not been targeted this reporting period d/t pt refusal and some issues with JG tube. Pt continues to demonstrate deficits in the areas of recall, attention and dysphagia. Skilled ST is required to address deficits in these areas and to increase functional independence and reduce caregiver burden prior to discharge.     Intensity: Minumum of 1-2 x/day, 30 to 90 minutes Frequency: 3 to 5 out of 7 days Duration/Length of Stay: Pending SNF placement Treatment/Interventions:     Daily Session  Skilled Therapeutic Interventions: Skilled ST treatment session focused on cognition goals. SLP facilitated session by providing Mod A to Max A verbal cues for use of external aides to recall orientation information. Pt  continues to required Max a multimodal cues for orientation information and continues to be unable to recall. SLP further facilitated session by reviewing ST goals and plan of care. Pt feels orientation goals have been unsuccessful and new goals set for recall of therapy activities to make recall goal more successful. Also plan to add some swallow strengthening exercises ( d/t pt refusal of PO trials) and possibly respiratory muscle  strengthening exercises as appropriate. Pt agreeable to change in plan of care. Pt left upright in recliner with all needs within reach.       Function:     Cognition Comprehension Comprehension assist level: Follows basic conversation/direction with extra time/assistive device  Expression   Expression assist level: Expresses basic 75 - 89% of the time/requires cueing 10 - 24% of the time. Needs helper to occlude trach/needs to repeat words.  Social Interaction Social Interaction assist level: Interacts appropriately with others with medication or extra time (anti-anxiety, antidepressant).  Problem Solving Problem solving assist level: Solves basic 90% of the time/requires cueing < 10% of the time  Memory Memory assist level: Recognizes or recalls 75 - 89% of the time/requires cueing 10 - 24% of the time   General    Pain    Therapy/Group: Individual Therapy  Jaeley Wiker B. Rutherford Nail, M.S., [place] 03/22/2016, 10:54 AM

## 2016-03-22 NOTE — Progress Notes (Signed)
Physical Therapy Session Note  Patient Details  Name: Ardyth GalRalph T Scheer MRN: [id number] Date of Birth: 1941/01/10  Today's Date: 03/22/2016 PT Individual Time: 1415-1430 PT Individual Time Calculation (min): 15 min  Short Term Goals: Week 4:  PT Short Term Goal 1 (Week 4): = LTG due to estimated LOS  Skilled Therapeutic Interventions/Progress Updates:   Patient awake in bed in good spirits. Patient transferred to EOB with supervision using rail. During stand pivot transfer to wheelchair with min A, patient's GJ tube was dislodged with patient reporting he was unable to feel tension on tube until it came out. RN and PA-C Pam Love notified and present to assess patient. Patient transferred back to bed with min A and sit > supine with mod A to lift BLE in bed. Patient left semi reclined in bed with RN and PA-C in room. Patient missed 45 min due to GJ tube dislodged.    Therapy Documentation Precautions:  Precautions Precautions: Fall Precaution Comments: NPO, gastrostomy tube, LLQ JP drain, abdominal surgical incision with pouch Restrictions Weight Bearing Restrictions: No Pain: Pain Assessment Pain Assessment: No/denies pain  See Function Navigator for Current Functional Status.   Therapy/Group: Individual Therapy  Ely Ballen, Prudencio PairRebecca A 03/22/2016, 2:47 PM

## 2016-03-22 NOTE — Progress Notes (Signed)
Social Work Patient ID: [name], male   DOB: 1940-08-29, 76 y.o.   MRN: [id number]  Met with pt and daughter to discuss many nursing homes do not take TPN due to cost and needing to have on-site pharmacy  And labs need to be checked daily. The places that the family wants, [name] [place] along with [facility] do not take TPN. Discussed pursuing an LTACH both daughter and wife do not want to Pursue this option. Will look at [facility] since closer to daughter. Pt having tube placed back in since in therapy got dislodged.

## 2016-03-23 ENCOUNTER — Inpatient Hospital Stay (HOSPITAL_COMMUNITY): Payer: Medicare Other | Admitting: Physical Therapy

## 2016-03-23 ENCOUNTER — Inpatient Hospital Stay (HOSPITAL_COMMUNITY): Payer: Medicare Other | Admitting: Speech Pathology

## 2016-03-23 ENCOUNTER — Inpatient Hospital Stay (HOSPITAL_COMMUNITY): Payer: Medicare Other | Admitting: Occupational Therapy

## 2016-03-23 LAB — GLUCOSE, CAPILLARY
GLUCOSE-CAPILLARY: 106 mg/dL — AB (ref 65–99)
GLUCOSE-CAPILLARY: 117 mg/dL — AB (ref 65–99)
GLUCOSE-CAPILLARY: 122 mg/dL — AB (ref 65–99)
GLUCOSE-CAPILLARY: 122 mg/dL — AB (ref 65–99)
Glucose-Capillary: 103 mg/dL — ABNORMAL HIGH (ref 65–99)
Glucose-Capillary: 98 mg/dL (ref 65–99)
Glucose-Capillary: 85 mg/dL (ref 65–99)
Glucose-Capillary: 123 mg/dL — ABNORMAL HIGH (ref 65–99)

## 2016-03-23 MED ORDER — M.V.I. ADULT IV INJ
INJECTION | INTRAVENOUS | Status: AC
  Administered 2016-03-23 (×2): via INTRAVENOUS
  Filled 2016-03-23: qty 1560

## 2016-03-23 MED ORDER — FAT EMULSION 20 % IV EMUL
240.0000 mL | INTRAVENOUS | Status: AC
  Administered 2016-03-23 (×2): 240 mL via INTRAVENOUS
  Filled 2016-03-23: qty 250

## 2016-03-23 NOTE — Progress Notes (Signed)
Physical Therapy Session Note  Patient Details  Name: Ardyth GalRalph T Donelan MRN: [id number] Date of Birth: 07/15/1940  Today's Date: 03/23/2016 PT Individual Time: 1300-1400 PT Individual Time Calculation (min): 60 min   Short Term Goals: Week 4:  PT Short Term Goal 1 (Week 4): = LTG due to estimated LOS  Skilled Therapeutic Interventions/Progress Updates:  Pt was sitting up in recliner with wife present in room. Pt was not complaining of any pain prior to or during therapy. Ambulatory transfers with RW between W/C and recliner and throughout therapy were min A due to maintaining upright posture when pivoting to sit down.  Pt self-propelled 100 ft and 60 ft in RW supervision. Pt ambulated 90 ft, 180 ft, and 80 ft Supervision with RW and verbal cues for deep breathing and proper use of RW. Pt ascended and descended forward 8 three inch steps with CGA and BUE support on rails. Pt performed 8 sit-to-stands from W/C supervision to strengthen BLE. Pt was left in room sitting up in W/C with wife and family friends present. All needs were within reach of pt before leaving the room.       Therapy Documentation Precautions:  Precautions Precautions: Fall Precaution Comments: NPO, gastrostomy tube, LLQ JP drain, abdominal surgical incision with pouch Restrictions Weight Bearing Restrictions: No   See Function Navigator for Current Functional Status.   Therapy/Group: Individual Therapy  Rudie MeyerJeffrey Nalina Yeatman 03/23/2016, 12:34 PM

## 2016-03-23 NOTE — Progress Notes (Signed)
Occupational Therapy Session Note  Patient Details  Name: Danny GalRalph T [name] MRN: [id number] Date of Birth: Aug 12, 1940  Today's Date: 03/23/2016 OT Individual Time: 1534-1600 OT Individual Time Calculation (min): 26 min    Short Term Goals: Week 4:  OT Short Term Goal 1 (Week 4): LTG=STG 2/2 estimated dc date  Skilled Therapeutic Interventions/Progress Updates:    Upon entering the room, pt supine in bed with daughter and wife present in the room. Pt requiring coaxing for participation as he reports being very tired from prior therapy sessions. Pt performed supine >sit with min A to EOB. Pt engaged in therapeutic exercises for B LE long arc quads and hip flexion. B UE's performing alternating punches 3 sets of 15. Pt needing rest breaks for each set secondary to fatigue. Pt needing mod verbal cues to swallow/manage saliva during therapeutic exercises. Pt returned to supine at end of session with steady assistance. Call bell and all needed items within reach.   Therapy Documentation Precautions:  Precautions Precautions: Fall Precaution Comments: NPO, gastrostomy tube, LLQ JP drain, abdominal surgical incision with pouch Restrictions Weight Bearing Restrictions: No General:   Vital Signs: Therapy Vitals Temp: 98.6 F (37 C) Temp Source: Oral Pulse Rate: 80 Resp: 16 BP: (!) 140/55 Patient Position (if appropriate): Lying Oxygen Therapy SpO2: 98 % O2 Device: Not Delivered Pain: Pain Assessment Pain Assessment: No/denies pain Faces Pain Scale: No hurt ADL:   Exercises:   Other Treatments:    See Function Navigator for Current Functional Status.   Therapy/Group: Individual Therapy  Alen BleacherBradsher, Parthiv Mucci P 03/23/2016, 4:24 PM

## 2016-03-23 NOTE — Patient Care Conference (Signed)
Inpatient RehabilitationTeam Conference and Plan of Care Update Date: 03/23/2016   Time: 2:00 PM    Patient Name: [name]      Medical Record Number: [id number]  Date of Birth: [birth year] Sex: Male         Room/Bed: 4W06C/4W06C-01 Payor Info: Payor: Multimedia programmer / Plan: [employer] MEDICARE / Product Type: *No Product type* /    Admitting Diagnosis: Debility  Admit Date/Time:  02/26/2016  8:56 PM Admission Comments: No comment available   Primary Diagnosis:  Debility Principal Problem: Debility  Patient Active Problem List   Diagnosis Date Noted  . Gastrojejunostomy tube dislodgement (HCC)   . Gastric dysmotility   . Post-operative state   . Hypoalbuminemia due to protein-calorie malnutrition (HCC)   . Gastrojejunostomy tube 02/06/2016 03/10/2016  . Jejunal anastomotic leak 03/10/2016  . Hyponatremia   . Anxiety and depression   . Sleep disturbance   . Orthostasis   . Leukocytosis   . Hyperglycemia   . FTT (failure to thrive) in adult   . Post-operative pain   . 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 Anemia of chronic disease 01/31/2016  . Stage 2 skin ulcer of sacral region 01/31/2016  . Hypomagnesemia 01/21/2016  . Hypokalemia 01/21/2016  . Pressure injury of skin 01/20/2016  . Pleural effusion   . Gastroesophageal reflux disease 01/19/2016  . Insomnia 01/19/2016  . On total parenteral nutrition (TPN) 01/17/2016  . Metabolic acidosis 01/17/2016  . Skin ulcer of abdominal wall, limited to breakdown of skin (HCC) 01/17/2016  . Dysphagia 01/17/2016  . Debility 01/17/2016  . 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 Vitamin B12 deficiency 01/02/2016  . BPH (benign prostatic hyperplasia)   . Posterior vitreous detachment   . Salzmann's nodular dystrophy of left eye   . Hyperopia of both eyes with astigmatism and presbyopia   . Central pterygium of right eye   . Pressure ulcer of buttock, right, unstageable (HCC)  12/30/2015  . Hypernatremia 12/28/2015  . Tobacco abuse 12/09/2015  . Gastritis 12/09/2015  . Acute respiratory failure with hypoxemia (HCC)   . Protein-calorie malnutrition, severe 12/04/2015  . Encephalopathy acute 12/03/2015  . Anxiety state 09/28/2015  . Hyperlipidemia   . 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 Coronary atherosclerosis of native coronary artery 04/24/2013  . Essential hypertension, benign 04/24/2013  . Other and unspecified hyperlipidemia 04/24/2013  . Central pterygium 10/30/2012  . 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 Cataract, nuclear sclerotic senile 10/30/2012    Expected Discharge Date: Expected Discharge Date: 03/17/16  Team Members Present: Physician leading conference: Dr. Maryla Morrow Social Worker Present: Dossie Der, LCSW Nurse Present: Carmie End, RN PT Present: Bayard Hugger, PT OT Present: Kearney Hard, OT SLP Present: Other (comment) (Happi Overton-SP) PPS Coordinator present : Tora Duck, RN, CRRN     Current Status/Progress Goal Weekly Team Focus  Medical   Debility and encephalopathy secondary to small bowel obstruction and associated complications   Improve nutrition, endurance, strength, more permanant placement of PEG  See above   Bowel/Bladder   incontinent of bowel and bladder at times /uses urinal with assistance LBM 03/22/15  min assist  assess bowel and bladder q shift   Swallow/Nutrition/ Hydration   remains NPO with JG tube, ice chip protocol initiated  Not address this week d/t JG issues and pt refusal  Respiratory muscle training, timely swallow initiation   ADL's   Min A/supervision  overall  Supervision/Min A   activity tolerance, modified bathing/dressing, endurance,    Mobility   Supervision - Min A overall with functional mobility.   Supervision overall; min for stairs and chair/bed transfers  W/C mobility; activity tolerance, bed  mobility, general strengthening, endurance, transfers   Communication   Min A  Supervision  addressing overarticulation and slow rate for increased intelligibility   Safety/Cognition/ Behavioral Observations  no unsafe behavior/MOD A  min A  assess saftey q shift   Pain   no complaints of pain  maintain pain level as tolerated  assess pain q shift and prn   Skin   no skin breakdown/turn q 2 hours/abd surgical incision improving/ smaller Eakin pouch used for drain/change q mon and thurs  no skin breakdown this admission  continue to monitor q shift    Rehab Goals Patient on target to meet rehab goals: Yes *See Care Plan and progress notes for long and short-term goals.  Barriers to Discharge: fistula, pouch, cognition, TPN, tube feeds, PEG dislodgement    Possible Resolutions to Barriers:  Cont TFs, cont TPN, follow labs, therapies, IR for PEG replacment    Discharge Planning/Teaching Needs:  Currently looking for a NH that takes TPN-high cost pt due to medical complexity. Wife and daughter have been here daily and observe in therapies.      Team Discussion:  Tube replaced yesterday after becoming dislodged in therapies. Progressing in his therapies and getting stronger. Dizziness improved. Abd wound healing now once a week changing eakins pouch. MBS on Friday with Speech therapy. Timed toileting with bowel and bladder. Looking for NH bed-high cost pt with TPN, wound, and tube feeds.  Revisions to Treatment Plan:  NHP   Continued Need for Acute Rehabilitation Level of Care: The patient requires daily medical management by a physician with specialized training in physical medicine and rehabilitation for the following conditions: Daily direction of a multidisciplinary physical rehabilitation program to ensure safe treatment while eliciting the highest outcome that is of practical value to the patient.: Yes Daily medical management of patient stability for increased activity during  participation in an intensive rehabilitation regime.: Yes Daily analysis of laboratory values and/or radiology reports with any subsequent need for medication adjustment of medical intervention for : Nutritional problems;Post surgical problems;Wound care problems;Other  [name], [name] 03/23/2016, 3:33 PM

## 2016-03-23 NOTE — Progress Notes (Signed)
[place] [facility] & [facility]ILITATION     PROGRESS NOTE  Subjective/Complaints: Pt seen laying in bed this AM.  He states he slept well overnight. Pt's GJ tube dislodged yesterday and advanced by surgery, will likely need IR intervention.   ROS: Denies vomiting, diarrhea, shortness of breath or chest pain.  Objective: Vital Signs: Blood pressure (!) 145/68, pulse 77, temperature 98 F (36.7 C), temperature source Oral, resp. rate 18, height 6\' 2"  (1.88 m), weight 68.9 kg (151 lb 14.4 oz), SpO2 98 %. Ir Cm Inj Any Colonic Tube W/fluoro  Result Date: 03/22/2016 INDICATION: Inadvertent retraction of gastrojejunostomy catheter. EXAM: FLUOROSCOPIC GUIDED INJECTION OF GASTROJEJUNOSTOMY TUBE COMPARISON:  Fluoroscopic guided gastrojejunostomy catheter exchange - 03/18/2016 MEDICATIONS: None. CONTRAST:  20mL ISOVUE-300 IOPAMIDOL (ISOVUE-300) INJECTION 61% - administered into the stomach and small bowel FLUOROSCOPY TIME:  36 seconds (2 mGy) COMPLICATIONS: None. PROCEDURE: The patient was positioned supine on the fluoroscopy table. Contrast was injected via both the gastrostomy and jejunostomy lumens demonstrating appropriate position functionality of both lumens. The gastrostomy balloon is normally positioned against the ventral wall of the gastric lumen. Both lumens were flushed with a small amount of saline and capped. A dressing was placed. The patient tolerated procedure well without immediate postprocedural complication. IMPRESSION: Appropriately positioned and functioning and gastrojejunostomy catheter. No exchange performed. Electronically Signed   By: Simonne Come M.D.   On: 03/22/2016 17:26    Recent Labs  03/21/16 0600  WBC 8.6  HGB 9.2*  HCT 28.6*  PLT 338    Recent Labs  03/21/16 0643  NA 134*  K 4.4  CL 105  GLUCOSE 100*  BUN 28*  CREATININE 0.63  CALCIUM 8.2*   CBG (last 3)   Recent Labs  03/23/16 0028 03/23/16 0438 03/23/16 0821  GLUCAP 122* 106* 103*    Wt  Readings from Last 3 Encounters:  03/23/16 68.9 kg (151 lb 14.4 oz)  02/26/16 78.6 kg (173 lb 4.5 oz)  01/19/16 79.2 kg (174 lb 8 oz)    Physical Exam:  BP (!) 145/68 (BP Location: Left Arm)   Pulse 77   Temp 98 F (36.7 C) (Oral)   Resp 18   Ht 6\' 2"  (1.88 m)   Wt 68.9 kg (151 lb 14.4 oz)   SpO2 98%   BMI 19.50 kg/m  Constitutional: NAD. Vital signs reviewed. Frail.  HENT: Normocephalic and atraumatic. +Stoma .  Eyes: EOMI. No discharge.  Cardiovascular: RRR. No JVD. Respiratory: CTA bilaterally. Unlabored.  GI: He exhibits no distension. BS+. +Eakins pouch. J tube. No pain to palpation                                                              Musculoskeletal: He exhibits no edema. He exhibits no tenderness.  Neurological: He is alert.  Speech with dysarthria Motor: 4/5 throughout (unchanged) Skin: Skin is warm and dry. Abdominal wounds with dressing.   Assessment/Plan: 1. Functional deficits secondary to debility and encephalopathy which require 3+ hours per day of interdisciplinary therapy in a comprehensive inpatient rehab setting. Physiatrist is providing close team supervision and 24 hour management of active medical problems listed below. Physiatrist and rehab team continue to assess barriers to discharge/monitor patient progress toward functional and medical goals.  Function:  Bathing Bathing position  Bathing activity did not occur: Refused Position: Wheelchair/chair at sink  Bathing parts Body parts bathed by patient: Right arm, Left arm, Chest, Right upper leg, Left upper leg, Left lower leg, Right lower leg Body parts bathed by helper: Buttocks, Front perineal area  Bathing assist Assist Level: Touching or steadying assistance(Pt > 75%), Set up   Set up : To obtain items  Upper Body Dressing/Undressing Upper body dressing Upper body dressing/undressing activity did not occur: Safety/medical concerns What is the patient wearing?: Pull over shirt/dress      Pull over shirt/dress - Perfomed by patient: Thread/unthread right sleeve, Put head through opening, Thread/unthread left sleeve, Pull shirt over trunk Pull over shirt/dress - Perfomed by helper: Pull shirt over trunk, Put head through opening        Upper body assist Assist Level: Set up   Set up : To obtain clothing/put away  Lower Body Dressing/Undressing Lower body dressing Lower body dressing/undressing activity did not occur: Safety/medical concerns What is the patient wearing?: Pants, Non-skid slipper socks, Ted Hose     Pants- Performed by patient: Thread/unthread right pants leg, Thread/unthread left pants leg Pants- Performed by helper: Pull pants up/down Non-skid slipper socks- Performed by patient: Don/doff left sock, Don/doff right sock Non-skid slipper socks- Performed by helper: Don/doff right sock, Don/doff left sock   Socks - Performed by helper: Don/doff right sock, Don/doff left sock   Shoes - Performed by helper: Don/doff right shoe, Don/doff left shoe, Fasten right, Fasten left       TED Hose - Performed by helper: Don/doff right TED hose, Don/doff left TED hose  Lower body assist Assist for lower body dressing: Touching or steadying assistance (Pt > 75%)      Toileting Toileting Toileting activity did not occur: No continent bowel/bladder event Toileting steps completed by patient: Adjust clothing prior to toileting Toileting steps completed by helper: Performs perineal hygiene, Adjust clothing after toileting Toileting Assistive Devices: Grab bar or rail  Toileting assist Assist level: Touching or steadying assistance (Pt.75%)   Transfers Chair/bed transfer   Chair/bed transfer method: Stand pivot Chair/bed transfer assist level: Touching or steadying assistance (Pt > 75%) Chair/bed transfer assistive device: Armrests     Locomotion Ambulation     Max distance: 225 Assist level: Supervision or verbal cues   Wheelchair   Type: Manual Max  wheelchair distance: 90 ft Assist Level: Supervision or verbal cues  Cognition Comprehension Comprehension assist level: Follows basic conversation/direction with no assist  Expression Expression assist level: Expresses basic 90% of the time/requires cueing < 10% of the time.  Social Interaction Social Interaction assist level: Interacts appropriately 90% of the time - Needs monitoring or encouragement for participation or interaction.  Problem Solving Problem solving assist level: Solves basic 90% of the time/requires cueing < 10% of the time  Memory Memory assist level: Recognizes or recalls 75 - 89% of the time/requires cueing 10 - 24% of the time    Medical Problem List and Plan: 1. Debility and encephalopathy secondary to small bowel obstruction and associated complications   Cont CIR  Cont NPO   No crushed meds through feeding tube  Enterocutaneous fistula, limiting tube feedings to 30 cc/h, cont TPN  Pt to go to IR for tube adjustment and placement 2. DVT Prophylaxis/Anticoagulation: Pharmaceutical: Lovenox  3. Pain Management: IV morphine every 4 hours prn severe pain  4. Mood: team to provide ego support. LCSW to follow for evaluation and support.   Prozac started 1/18 5.  Neuropsych: This patient is not fully capable of making decisions on his own behalf.  6. Skin/Wound Care: Continue air mattress overlay. Continue large Eakin's pouch to manage wound drainage. WOC for management. Encourage side lying to help manage MASD.  7. Malnutrition/Fluids/Electrolytes/Nutrition: continue TPN.   Albumin continues to improve on 2/5 8. Abdominal abscess: Leucocytosis resolving. Completed rocephin 1/17 9. Anemia of chronic illness:   Hb 9.2 on 2/5  Labs ordered for tomorrow  Cont to monitor 10. Electrolyte abnormality/Acute renal insufficiency: Managed by TNA   Mag stable 2/5  Labs ordered for tomorrow 11. Encephalopathy: Changed haldol to IM prn for management of agitation/anxiety.  12.  Hyperglycemia  Overall controlled 2/7 13 Sleep disturbance  Has tried Haldol without benefit, ativan causes hallucinations  Continue Xanax elixer,  Improved 14. Mild hyponatremia  Na+ 134 on 2/5  Labs ordered for tomorrow   LOS (Days) 26 A FACE TO FACE EVALUATION WAS PERFORMED  Macdonald Rigor Karis Juba 03/23/2016 8:45 AM

## 2016-03-23 NOTE — Progress Notes (Signed)
Subjective: He is getting therapy now.  No real complaints at this time.    Objective: Vital signs in last 24 hours: Temp:  [98 F (36.7 C)-98.3 F (36.8 C)] 98 F (36.7 C) (02/07 0401) Pulse Rate:  [77-81] 77 (02/07 0401) Resp:  [17-18] 18 (02/07 0401) BP: (145-164)/(56-68) 145/68 (02/07 0401) SpO2:  [97 %-98 %] 98 % (02/07 0401) Weight:  [68.9 kg (151 lb 14.4 oz)] 68.9 kg (151 lb 14.4 oz) (02/07 0401) Last BM Date: 03/22/16 Drain 500 recorded Urine 1100 recorded, nothing else listed in I/O Afebrile, VSS Glucose OK last labs 03/21/16 IR 03/22/16:  Appropriately positioned and functioning and gastrojejunostomy catheter. No exchange performed. Intake/Output from previous day: 02/06 0701 - 02/07 0700 In: -  Out: 1600 [Urine:1100; Drains:500] Intake/Output this shift: Total I/O In: 30 [I.V.:30] Out: -   General appearance: alert, cooperative and no distress GI: soft, non-tender; bowel sounds normal; no masses,  no organomegaly and EC fistula is stable, FT back in place.  Lab Results:   Recent Labs  03/21/16 0600  WBC 8.6  HGB 9.2*  HCT 28.6*  PLT 338    BMET  Recent Labs  03/21/16 0643  NA 134*  K 4.4  CL 105  CO2 22  GLUCOSE 100*  BUN 28*  CREATININE 0.63  CALCIUM 8.2*   PT/INR No results for input(s): LABPROT, INR in the last 72 hours.   Recent Labs Lab 03/17/16 0536 03/21/16 0643  AST 54* 47*  ALT 52 50  ALKPHOS 167* 204*  BILITOT 0.5 0.4  PROT 6.2* 5.9*  ALBUMIN 1.8* 1.9*     Lipase     Component Value Date/Time   LIPASE 55 (H) 01/20/2016 0158     Studies/Results: Ir Cm Inj Any Colonic Tube W/fluoro  Result Date: 03/22/2016 INDICATION: Inadvertent retraction of gastrojejunostomy catheter. EXAM: FLUOROSCOPIC GUIDED INJECTION OF GASTROJEJUNOSTOMY TUBE COMPARISON:  Fluoroscopic guided gastrojejunostomy catheter exchange - 03/18/2016 MEDICATIONS: None. CONTRAST:  20mL ISOVUE-300 IOPAMIDOL (ISOVUE-300) INJECTION 61% - administered into the  stomach and small bowel FLUOROSCOPY TIME:  36 seconds (2 mGy) COMPLICATIONS: None. PROCEDURE: The patient was positioned supine on the fluoroscopy table. Contrast was injected via both the gastrostomy and jejunostomy lumens demonstrating appropriate position functionality of both lumens. The gastrostomy balloon is normally positioned against the ventral wall of the gastric lumen. Both lumens were flushed with a small amount of saline and capped. A dressing was placed. The patient tolerated procedure well without immediate postprocedural complication. IMPRESSION: Appropriately positioned and functioning and gastrojejunostomy catheter. No exchange performed. Electronically Signed   By: Simonne ComeJohn  Watts M.D.   On: 03/22/2016 17:26    Medications: . sodium chloride   Intravenous Q2200  . acetaminophen (TYLENOL) oral liquid 160 mg/5 mL  650 mg Per Tube TID  . Alprazolam 1 mg/ml suspension  0.5 mg Per Tube QHS,MR X 1  . cycloSPORINE  1 drop Both Eyes BID  . enoxaparin (LOVENOX) injection  40 mg Subcutaneous Q24H  . erythromycin ethylsuccinate  200 mg Per Tube Q8H  . feeding supplement (JEVITY 1.2 CAL)  1,000 mL Per Tube Q24H  . FLUoxetine  20 mg Per Tube Daily  . lidocaine  1 patch Transdermal Q24H  . lip balm  1 application Topical BID  . liver oil-zinc oxide   Topical Daily  . mouth rinse  15 mL Mouth Rinse q12n4p  . MUSCLE RUB   Topical TID AC  . nicotine  7 mg Transdermal Daily  . omeprazole  40 mg  Per Tube Daily  . sodium chloride flush  10-40 mL Intracatheter Q12H    Assessment/Plan S/P Robotic anterior and posterior vagotomy, BI anastomosis, DOR fundoplication, omentopexy for partial GOO 09/2015, DR. Viviann Spare GROSS  - Feeding Jejunostomy and gastrostomy/EGD balloon dilatation, secondary to edema at anastomosis, 12/01/15, DR. Karie Soda - S/p diagnostic laparoscopy, omentopexy of jejunal disruption, and washout for jejunal disruption (pt pulled out jejunostomy tube)and gastric leak. 12/03/15, Dr.  Karie Soda (pt pulled out jejunostomy tube) PSBO with 2 days of nausea and vomiting on readmit 01/20/16 prior procedures above S/p EXPLORATORY LAPAROTOMY, LYSIS OF ADHESIONS, SEGMENTAL SMALL BOWEL RESECTION, GASTROJEJUNOSTOMY , 02/06/16, Dr. Chevis Pretty G tube dislodged 02/10/16, 2/2, & 2/6, replaced by IR: bed tansfers New EC fistula midline 02/13/16/CT shows LLQ intraabdominal abscess CT guided LLQabdominal abscess drainage, 02/14/16; IR Dr. Theo Dills op Ventricular tachycardia Tracheostomy Aspiration pneumonia/pleural effusion Malnutrition/deconditioning Anemia  Stage II sacral decubitus Malnutrition formerly on TF/now on TNA FEN: Tube feeding/TNA ID: Erythromycin  started 02/14/21/18 =>> day 15 DVT: Lovenox/SCD   Plan:  Resume tube feeds and therapies.                LOS: 26 days    Mahaley Schwering 03/23/2016 [id number]

## 2016-03-23 NOTE — Progress Notes (Signed)
Speech Language Pathology Daily Session Note  Patient Details  Name: [name] MRN: [id number] Date of Birth: 1941/01/10  Today's Date: 03/23/2016 SLP Individual Time: 0900-0930 SLP Individual Time Calculation (min): 30 min  Short Term Goals: Week 4: SLP Short Term Goal 1 (Week 4): Pt will utilize speech intelligibility strategies at the sentence level with Min A verbal cues to achieve  ~90% intelligibility.  SLP Short Term Goal 2 (Week 4): Pt will complete semi-complex tasks with Min A verbal cues. SLP Short Term Goal 3 (Week 4): Pt will sustain attention to functional tasks for ~ 30 minutes with supervision cues.  SLP Short Term Goal 4 (Week 4): Pt will utilize external memory aids to recall new, daily information with Min A verbal cues.  SLP Short Term Goal 5 (Week 4): Pt will use memory aide to recall informaiton from previous therapy sessions with Min A verbal cues.  SLP Short Term Goal 6 (Week 4): Pt will consume therapeutic trials of ice chips with minimal overt s/s of aspiration over 2 consecutive sessions to demonstrate readiness for repeat MBS.   Skilled Therapeutic Interventions: Skilled treatment focused on diagnostic testing for respiratory muscle training performed. Pt obtained a Maximum Inspiratory Pressure (MIP) of 22 cm H2O which is substancially below the reference value for his age of 77.84 cmH20. Pt also obtained a Maximum Expiratory Pressure (MEP) of 56 cmH20 which is below reference for his age of [address].92. Given current values and pt's functional deficts with pharyngeal dysphagia, pt appears to be a good candidate for RMT. MD approval given. Education provided to pt on benefits of RMT which include increase respirtory support for better airway protection, secretion management, increased vocal intensity for communicating basic wants and needs. Will initiate inspiratory muscle training at 16.5 cmH2O and expiratory muscle training at 42 cmH2O. Pt required Min A verbal cues for  increased vocal intensity and to cough to aid in clearing wet vocal quality. Pt was intelligiblity at the phrase level with 100%. Education provided to pt on PA recommendation for pt to cough and spit or swallow, not use oral suction to promote increase independence with secretion management and swallow function. Pt left upright in bed, bed alarm on and all needs within reach.       Function:    Cognition Comprehension Comprehension assist level: Follows basic conversation/direction with no assist  Expression   Expression assist level: Expresses basic 90% of the time/requires cueing < 10% of the time.  Social Interaction Social Interaction assist level: Interacts appropriately 90% of the time - Needs monitoring or encouragement for participation or interaction.  Problem Solving Problem solving assist level: Solves basic 90% of the time/requires cueing < 10% of the time  Memory Memory assist level: Recognizes or recalls 75 - 89% of the time/requires cueing 10 - 24% of the time    Pain    Therapy/Group: Individual Therapy   Carrel Leather B. Dreama Saaverton, M.S., CCC-SLP Speech-Language Pathologist   Daneli Butkiewicz 03/23/2016, 10:41 AM

## 2016-03-23 NOTE — Progress Notes (Signed)
PHARMACY - TOTAL PARENTERAL NUTRITION CONSULT NOTE   Pharmacy Consult:  TPN Indication:  SBO and new EC fistula 02/13/16  Patient Measurements: Height: _0  (188 cm) Weight: 151 lb 14.4 oz (68.9 kg) IBW/kg (Calculated) : 82.2   Body mass index is 19.5 kg/m.  Assessment:  39 YOM with history of multiple procedures and extended hospitalization for recurrent partial GOO 2/2 pyloric stricture.  Patient was on TPN for several days, then discharged on 12/29/16 with J/G tube in place for TF. He was unable to tolerate TF and was transitioned back to TPN by [facility] on 01/05/17.  Patient was readmitted on 01/19/17 with partial SBO.  Patient failed MBS on 01/26/16 and did not tolerate bolus feeds through G-tube on 01/27/16. On 12/23, he underwent ex-lap with LoA, segmental SBR, and gastrojejunostomy. On 02/12/16, TF were found draining from midline incision with CT abd showing bowel perforation at Merna site and development of abscesses.   GI: EC fistula at SB jejunal anastomosis which is decreasing in size. Has had trouble advancing TFs so had been advancing very slowly per surgery. On 2/2 GJ tube needed to be replaced so TFs were held. Now TFs have been restarted and are running at 45m/hr. Surgery plans to leave at 355mhr until fistula closes - GTube out of place yesterday but IR confirmed placement is now good. Albumin low at 1.9, prealbumin ok at 28.9. Drain output remains high at 500 mL yesterday. Endo: no hx DM. CBGs 91-112. No SSI  Lytes: Na slightly low at 134, CoCa 9.9. Mg and Phos WNL (next labs thurs) Renal: SCr and BUN stable. NS at 10011mr x 10 hrs overnight Pulm: RA - nicotine patch Cards: orthostatic hypotension improved - VSS Hepatobil:AST and Alk Phos slightly elevated. ALT and Tbilli wnl. TG good at 131 (2/5) Neuro: Prozac, Xanax, lidocaine patch, PRN morphine, APAP TID Heme/Onc: S/p Iron dextran ID: S/p ceftriaxone for UTI. UCx negative. Afebrile, WBC wnl Best Practices: Lovenox,  MC TPN Access: PICC TPN start date: [facility] > [facility] 01/20/16 > [facility] 02/26/15  Home TPN: 2156 kCal and 75gm protein per day (405g CHO = 1376 kCal, 50g lipid = 480 kCal)  Nutritional Goals: per RD recs on 1/31 Kcal: 2100-2300 Protein: 95-110 g  Current Nutrition:  Clinimix E 5/15 + lipids Jevity 1.2 at 56m15m for 16 hrs (576 kcal and 27 g of protein) NPO except ice chips  Plan: Continue cyclic Clinimix E 5/150/96fuse 1560 ml over 12 hrs: 50 mL/hr x 1 hr, then 146 mL/hr x 10 hrs, then 50 mL/hr x 1 hr. Continue IV lipid emulsions 20ml37mover 12 hrs to help meet kcal goal since TG ok Continue Jevity 1.2 at 56ml/32mor 16 hrs per surgery TPN, IV lipid emulsions, and TFs provides 105 g of protein and 2164 kCals per day meeting 100% of protein and kCal needs Add MVI  in TPN Add TE (every other day) in TPN - due 2/9 Monitor TPN labs, Bmet (next planned is Thursday) F/U tolerance of TFs, surgery plans  MichaeLevester FreshmD, BCPS, BCCCP Clinical Pharmacist Clinical phone for 03/23/2016 from 7a-3:30p: [phone] 3:30p, please call main pharmacy at: [phone] 03/23/2016 8:19 AM

## 2016-03-23 NOTE — Progress Notes (Signed)
Occupational Therapy Session Note  Patient Details  Name: BOLDEN HAGERMAN MRN: [id number] Date of Birth: [birth year]  Today's Date: 03/23/2016 OT Individual Time: 1000-1115 OT Individual Time Calculation (min): 75 min    Short Term Goals: Week 4:  OT Short Term Goal 1 (Week 4): LTG=STG 2/2 estimated dc date  Skilled Therapeutic Interventions/Progress Updates:   1:1 OT session focused on modified bathing/derssing, functional transfers, standing endurance, and activity tolerance. Pt ambulated w/ RW to bathroom with min guard A for timed toileting. Pt unable to void. Ambulated back to sink for modified bathing/dressing with overall min A. OT provided pt with long-handled sponge for increased independence with LB bathing. Pt able to stand to wash bottom today with close supervision for standing balance. Pt tolerated ~ 8 minutes at longest standing bout. Pt with increased initiation with self-care tasks and able to tolerate consecutive ADLs with less rest in between. Pt left seated in recliner at end of session with needs met.   Therapy Documentation Precautions:  Precautions Precautions: Fall Precaution Comments: NPO, gastrostomy tube, LLQ JP drain, abdominal surgical incision with pouch Restrictions Weight Bearing Restrictions: No Pain:  denies pain  See Function Navigator for Current Functional Status.   Therapy/Group: Individual Therapy  Valma Cava 03/23/2016, 11:08 AM

## 2016-03-23 NOTE — Progress Notes (Signed)
Nutrition Follow-up  DOCUMENTATION CODES:   Non-severe (moderate) malnutrition in context of chronic illness  INTERVENTION:  Continue TPN per Pharmacy.  Continue Jevity 1.2 formula via J-tube at 30 ml/hr x 16 hours (2pm-6am) which provides 576 kcal, 27grams of protein, and 389 ml of free water.   Once EC fistula is fully closed and there is no leaking from the incision, recommend switching formula to Jevity 1.5 formulaand advance by 10 ml every 4-6 hours or as tolerated to goal rate of 65 ml/hr x 20 hours (may be off TF for up to 4 hours) with 30 ml Prostat BID to provide 2150 kcal, 113 grams of protein, 988 ml of free water with TPN wean as tolerated.  NUTRITION DIAGNOSIS:   Inadequate oral intake related to altered GI function (Enteric fistula) as evidenced by NPO status (TPN dependant); ongoing  GOAL:   Patient will meet greater than or equal to 90% of their needs; met  MONITOR:   Labs, Weight trends, TF tolerance, Skin, I & O's, Other (Comment) (TPN tolerance)  REASON FOR ASSESSMENT:   Malnutrition Screening Tool    ASSESSMENT:   76 y/o mle w/ complex PMHx including COPD, GERD, CAD, PUD. Underwent gastrectomy in August due to stricture w/ post op complications requiring jejunostomy. He was most recently admitted for SBO and enteric fistula. He is on bowel rest, TPN dependent.   Pt has been tolerating her tube feeds at 30 ml/hr. Plans to continue TPN until TF goal rate is close to being met. Per Surgery note, plans to continue TF goal rate at 30 ml/hr until fistula is fully closed and no leaking occurs, then further tube feeding advancement may be made.   Per Pharmacy note, continue cyclic Clinimix E 1/61. Infuse 1560 ml over 12 hrs: 50 mL/hr x 1 hr, then 146 mL/hr x 10 hrs, then 50 mL/hr x 1 hr. Continue IV lipid emulsions 16m/hr over 12 hrs   TPN and tube feeding provides 2164 kcal (100% of needs) and 105 grams of protein.   Diet Order:  Diet NPO time specified  Except for: Ice Chips TPN (CLINIMIX-E) Adult  Skin:  Wound (see comment) (Midline wound with EC fistula)  Last BM:  2/6  Height:   Ht Readings from Last 1 Encounters:  02/27/16 6' 2"  (1.88 m)    Weight:   Wt Readings from Last 1 Encounters:  03/23/16 151 lb 14.4 oz (68.9 kg)    Ideal Body Weight:  86.36 kg  BMI:  Body mass index is 19.5 kg/m.  Estimated Nutritional Needs:   Kcal:  2100-2300 (29-32 kcal/kg bw)  Protein:  95-110 g (1.3-1.5 g/kg bw)  Fluid:  >1.8 L (25 ml/kg bw)  EDUCATION NEEDS:   No education needs identified at this time  SCorrin Parker MS, RD, LDN Pager # [phone] hours/ weekend pager # [phone]

## 2016-03-24 ENCOUNTER — Inpatient Hospital Stay (HOSPITAL_COMMUNITY): Payer: Medicare Other | Admitting: Physical Therapy

## 2016-03-24 ENCOUNTER — Inpatient Hospital Stay (HOSPITAL_COMMUNITY): Payer: Medicare Other | Admitting: Occupational Therapy

## 2016-03-24 ENCOUNTER — Inpatient Hospital Stay (HOSPITAL_COMMUNITY): Payer: Medicare Other | Admitting: Speech Pathology

## 2016-03-24 DIAGNOSIS — I159 Secondary hypertension, unspecified: Secondary | ICD-10-CM

## 2016-03-24 LAB — GLUCOSE, CAPILLARY
GLUCOSE-CAPILLARY: 119 mg/dL — AB (ref 65–99)
GLUCOSE-CAPILLARY: 78 mg/dL (ref 65–99)
GLUCOSE-CAPILLARY: 119 mg/dL — AB (ref 65–99)
Glucose-Capillary: 99 mg/dL (ref 65–99)
Glucose-Capillary: 96 mg/dL (ref 65–99)

## 2016-03-24 LAB — COMPREHENSIVE METABOLIC PANEL
ALT: 36 U/L (ref 17–63)
AST: 34 U/L (ref 15–41)
Albumin: 1.6 g/dL — ABNORMAL LOW (ref 3.5–5.0)
Alkaline Phosphatase: 175 U/L — ABNORMAL HIGH (ref 38–126)
Anion gap: 7 (ref 5–15)
BUN: 24 mg/dL — AB (ref 6–20)
CO2: 21 mmol/L — ABNORMAL LOW (ref 22–32)
CREATININE: 0.58 mg/dL — AB (ref 0.61–1.24)
Calcium: 7.4 mg/dL — ABNORMAL LOW (ref 8.9–10.3)
Chloride: 109 mmol/L (ref 101–111)
GFR calc Af Amer: >60 mL/min (ref >60)
Glucose, Bld: 90 mg/dL (ref 65–99)
Potassium: 3.9 mmol/L (ref 3.5–5.1)
Sodium: 137 mmol/L (ref 135–145)
Total Bilirubin: 0.2 mg/dL — ABNORMAL LOW (ref 0.3–1.2)
Total Protein: 5.3 g/dL — ABNORMAL LOW (ref 6.5–8.1)

## 2016-03-24 LAB — CBC
HEMATOCRIT: 26.9 % — AB (ref 39.0–52.0)
HEMOGLOBIN: 8.5 g/dL — AB (ref 13.0–17.0)
MCH: 29.1 pg (ref 26.0–34.0)
MCHC: 31.6 g/dL (ref 30.0–36.0)
MCV: 92.1 fL (ref 78.0–100.0)
Platelets: 284 10*3/uL (ref 150–400)
RBC: 2.92 MIL/uL — ABNORMAL LOW (ref 4.22–5.81)
RDW: 16.1 % — ABNORMAL HIGH (ref 11.5–15.5)
WBC: 7.1 10*3/uL (ref 4.0–10.5)

## 2016-03-24 LAB — PHOSPHORUS: Phosphorus: 3.9 mg/dL (ref 2.5–4.6)

## 2016-03-24 LAB — MAGNESIUM: MAGNESIUM: 1.6 mg/dL — AB (ref 1.7–2.4)

## 2016-03-24 MED ORDER — TRACE MINERALS CR-CU-MN-SE-ZN 10-1000-500-60 MCG/ML IV SOLN
INTRAVENOUS | Status: AC
  Administered 2016-03-24 (×2): via INTRAVENOUS
  Filled 2016-03-24: qty 1560

## 2016-03-24 MED ORDER — FAT EMULSION 20 % IV EMUL
240.0000 mL | INTRAVENOUS | Status: AC
  Administered 2016-03-24: 240 mL via INTRAVENOUS
  Filled 2016-03-24: qty 250

## 2016-03-24 NOTE — Progress Notes (Signed)
Occupational Therapy Session Note  Patient Details  Name: [name] MRN: [id number] Date of Birth: 1940/12/23  Today's Date: 03/24/2016 OT Individual Time: 1300-1400 OT Individual Time Calculation (min): 60 min    Short Term Goals: Week 4:  OT Short Term Goal 1 (Week 4): LTG=STG 2/2 estimated dc date  Skilled Therapeutic Interventions/Progress Updates:    1:1 OT session focused on pt/family education, transfer training, timed toileting, and dc planning. Discussed timed toileting with pt and family- discussed trying to use urinal during for urgency during daytime and provided pt with male urinal as pt reports spilling urin often when using male urinal. Pt ambulated to bathroom and transferred onto toilet with supervision and intermitent min A when turning to toilet. Educated pt on safe way to manage clothing for toileting and pt able to pius pants off of hips with min guard for balance. [name] and condom catheter removed. Pt then ambulated to the sink and washed hands in standing with supervision and sink used for support. Pt then brought to therapy apartment and educated pt and fmaily on safe walk-in shower transfer for when pt is able to shower. Pt demonstrates transfer with min A. Provided family with home measurement sheet and pt left in wc with needs met.   Therapy Documentation Precautions:  Precautions Precautions: Fall Precaution Comments: NPO, gastrostomy tube, LLQ JP drain, abdominal surgical incision with pouch Restrictions Weight Bearing Restrictions: No Pain: Pain Assessment Pain Assessment: No/denies pain Pain Score: 0-No pain  See Function Navigator for Current Functional Status.   Therapy/Group: Individual Therapy  Valma Cava 03/24/2016, 2:07 PM

## 2016-03-24 NOTE — Consult Note (Signed)
WOC Nurse wound follow up Wound type: surgical with EC fistula Measurement:open area now measures 3cm x 1.0cm x 0.1 Wound bed: fistula appears to be stomatizing, hypergranulation tissue present, no depth.  One additional area of hypergranulation tissue along healed incision distally Drainage (amount, consistency, odor) scant, green, strong fecal odor Periwound: intact  Dressing procedure/placement/frequency: Removed current pouch and cleansed site and surrounding skin Verified current Eakin pattern still appropriate for site New small Eakin cut using current pattern.  2" barrier ring cut in half and each half used along edge of fistula site 1 strip of Eakin pouch material used over the healed distal portion of the incision New small Eakin applied and patient placed hand over the pouch to allow for body heat to warm and seal pouch.  Window framed pouch with medifix tape. Small silicone foam placed over the strip of eakin material distally that is not covered by the pouch. Pouch change has been each Thursday.   Patient's daughter at the bedside for teaching today on pouch change.    WOC Nurse team will follow along with you for weekly wound assessments and management of fistula pouching.   Please notify me of any acute changes in the wounds or any new areas of concerns Sachiko Methot [facility] MSN, RN,CWOCN, CNS [phone]

## 2016-03-24 NOTE — Progress Notes (Signed)
[place] PHYSICAL MEDICINE & REHABILITATION     PROGRESS NOTE  Subjective/Complaints: Pt seen laying in bed this AM.  He states he slept well and is doing well this AM.  Tube feeds noted to be malfunctioning, informed Nurse tech.   ROS: Denies vomiting, diarrhea, shortness of breath or chest pain.  Objective: Vital Signs: Blood pressure (!) 140/52, pulse 71, temperature 97.6 F (36.4 C), temperature source Oral, resp. rate 16, height 6\' 2"  (1.88 m), weight 77.2 kg (170 lb 3.1 oz), SpO2 97 %. Ir Cm Inj Any Colonic Tube W/fluoro  Result Date: 03/22/2016 INDICATION: Inadvertent retraction of gastrojejunostomy catheter. EXAM: FLUOROSCOPIC GUIDED INJECTION OF GASTROJEJUNOSTOMY TUBE COMPARISON:  Fluoroscopic guided gastrojejunostomy catheter exchange - 03/18/2016 MEDICATIONS: None. CONTRAST:  20mL ISOVUE-300 IOPAMIDOL (ISOVUE-300) INJECTION 61% - administered into the stomach and small bowel FLUOROSCOPY TIME:  36 seconds (2 mGy) COMPLICATIONS: None. PROCEDURE: The patient was positioned supine on the fluoroscopy table. Contrast was injected via both the gastrostomy and jejunostomy lumens demonstrating appropriate position functionality of both lumens. The gastrostomy balloon is normally positioned against the ventral wall of the gastric lumen. Both lumens were flushed with a small amount of saline and capped. A dressing was placed. The patient tolerated procedure well without immediate postprocedural complication. IMPRESSION: Appropriately positioned and functioning and gastrojejunostomy catheter. No exchange performed. Electronically Signed   By: Simonne ComeJohn  Watts M.D.   On: 03/22/2016 17:26    Recent Labs  03/24/16 0600  WBC 7.1  HGB 8.5*  HCT 26.9*  PLT 284    Recent Labs  03/24/16 0500  NA 137  K 3.9  CL 109  GLUCOSE 90  BUN 24*  CREATININE 0.58*  CALCIUM 7.4*   CBG (last 3)   Recent Labs  03/23/16 2026 03/23/16 2338 03/24/16 0351  GLUCAP 123* 117* 119*    Wt Readings from  Last 3 Encounters:  03/24/16 77.2 kg (170 lb 3.1 oz)  02/26/16 78.6 kg (173 lb 4.5 oz)  01/19/16 79.2 kg (174 lb 8 oz)    Physical Exam:  BP (!) 140/52 (BP Location: Left Arm)   Pulse 71   Temp 97.6 F (36.4 C) (Oral)   Resp 16   Ht 6\' 2"  (1.88 m)   Wt 77.2 kg (170 lb 3.1 oz)   SpO2 97%   BMI 21.85 kg/m  Constitutional: NAD. Vital signs reviewed. Frail.  HENT: Normocephalic and atraumatic.  Eyes: EOMI. No discharge.  Cardiovascular: RRR. No JVD. Respiratory: CTA bilaterally. Unlabored.  GI: He exhibits no distension. BS+. +Eakins pouch. GJ tube. No pain to palpation                                                              Musculoskeletal: He exhibits no edema. He exhibits no tenderness.  Neurological: He is alert.  Speech with dysarthria Motor: 4/5 throughout (stable) Skin: Skin is warm and dry. Abdominal wounds with dressing.   Assessment/Plan: 1. Functional deficits secondary to debility and encephalopathy which require 3+ hours per day of interdisciplinary therapy in a comprehensive inpatient rehab setting. Physiatrist is providing close team supervision and 24 hour management of active medical problems listed below. Physiatrist and rehab team continue to assess barriers to discharge/monitor patient progress toward functional and medical goals.  Function:  Bathing Bathing position Bathing  activity did not occur: Refused Position: Wheelchair/chair at sink  Bathing parts Body parts bathed by patient: Right arm, Left arm, Buttocks, Right upper leg, Left upper leg, Right lower leg, Left lower leg, Front perineal area, Chest Body parts bathed by helper: Buttocks, Front perineal area  Bathing assist Assist Level: Touching or steadying assistance(Pt > 75%), Assistive device Assistive Device Comment: long-handled sponge Set up : To obtain items  Upper Body Dressing/Undressing Upper body dressing Upper body dressing/undressing activity did not occur: Safety/medical  concerns What is the patient wearing?: Pull over shirt/dress     Pull over shirt/dress - Perfomed by patient: Thread/unthread right sleeve, Thread/unthread left sleeve, Put head through opening Pull over shirt/dress - Perfomed by helper: Pull shirt over trunk        Upper body assist Assist Level: Touching or steadying assistance(Pt > 75%)   Set up : To obtain clothing/put away  Lower Body Dressing/Undressing Lower body dressing Lower body dressing/undressing activity did not occur: Safety/medical concerns What is the patient wearing?: Non-skid slipper socks, Ted Hose, Pants     Pants- Performed by patient: Thread/unthread right pants leg, Thread/unthread left pants leg Pants- Performed by helper: Pull pants up/down Non-skid slipper socks- Performed by patient: Don/doff right sock Non-skid slipper socks- Performed by helper: Don/doff left sock   Socks - Performed by helper: Don/doff right sock, Don/doff left sock   Shoes - Performed by helper: Don/doff right shoe, Don/doff left shoe, Fasten right, Fasten left       TED Hose - Performed by helper: Don/doff right TED hose, Don/doff left TED hose  Lower body assist Assist for lower body dressing: Touching or steadying assistance (Pt > 75%)      Toileting Toileting Toileting activity did not occur: No continent bowel/bladder event Toileting steps completed by patient: Adjust clothing prior to toileting Toileting steps completed by helper: Performs perineal hygiene, Adjust clothing after toileting Toileting Assistive Devices: Grab bar or rail  Toileting assist Assist level: Touching or steadying assistance (Pt.75%)   Transfers Chair/bed transfer   Chair/bed transfer method: Ambulatory Chair/bed transfer assist level: Touching or steadying assistance (Pt > 75%) Chair/bed transfer assistive device: Armrests, Patent attorney     Max distance: 180 ft Assist level: Supervision or verbal cues   Wheelchair    Type: Manual Max wheelchair distance: 100 ft Assist Level: Supervision or verbal cues  Cognition Comprehension Comprehension assist level: Follows basic conversation/direction with no assist  Expression Expression assist level: Expresses basic needs/ideas: With extra time/assistive device  Social Interaction Social Interaction assist level: Interacts appropriately with others with medication or extra time (anti-anxiety, antidepressant).  Problem Solving Problem solving assist level: Solves basic problems with no assist  Memory Memory assist level: Recognizes or recalls 75 - 89% of the time/requires cueing 10 - 24% of the time    Medical Problem List and Plan: 1. Debility and encephalopathy secondary to small bowel obstruction and associated complications   Cont CIR  Cont NPO   No crushed meds through feeding tube  Enterocutaneous fistula, limiting tube feedings to 30 cc/h, cont TPN 2. DVT Prophylaxis/Anticoagulation: Pharmaceutical: Lovenox  3. Pain Management: IV morphine every 4 hours prn severe pain  4. Mood: team to provide ego support. LCSW to follow for evaluation and support.   Prozac started 1/18 5. Neuropsych: This patient is not fully capable of making decisions on his own behalf.  6. Skin/Wound Care: Continue air mattress overlay. Continue large Eakin's pouch to manage wound  drainage. WOC for management. Encourage side lying to help manage MASD.  7. Malnutrition/Fluids/Electrolytes/Nutrition: continue TPN.   Albumin down to 1.6 on 2/8  Plan for swallow tomorrow, however, pt will likely not be able to sustain nutrition, even if he passes swallow 8. Abdominal abscess: Leucocytosis resolved. Completed rocephin 1/17 9. Anemia of chronic illness:   Hb 8.5 on 2/8  Cont to monitor 10. Electrolyte abnormality/Acute renal insufficiency: Managed by TNA   Mag stable 1.6 2/8 11. Encephalopathy: Improving.  12. Hyperglycemia  Overall controlled 2/8 13 Sleep disturbance  Has tried  Haldol without benefit, ativan causes hallucinations  Continue Xanax elixer,  Improved 14. Mild hyponatremia: Improved  Na+ 137 on 2/8 15. Secondary HTN  Elevated, but stable this AM  Cont to monitor  LOS (Days) 27 A FACE TO FACE EVALUATION WAS PERFORMED  Haseeb Fiallos Karis Juba 03/24/2016 8:20 AM

## 2016-03-24 NOTE — Progress Notes (Signed)
Speech Language Pathology Daily Session Note  Patient Details  Name: [name] MRN: [id number] Date of Birth: [birth year]  Today's Date: 03/24/2016 SLP Individual Time: 0900-0930 SLP Individual Time Calculation (min): 30 min  Short Term Goals: Week 4: SLP Short Term Goal 1 (Week 4): Pt will utilize speech intelligibility strategies at the sentence level with Min A verbal cues to achieve  ~90% intelligibility.  SLP Short Term Goal 2 (Week 4): Pt will complete semi-complex tasks with Min A verbal cues. SLP Short Term Goal 3 (Week 4): Pt will sustain attention to functional tasks for ~ 30 minutes with supervision cues.  SLP Short Term Goal 4 (Week 4): Pt will utilize external memory aids to recall new, daily information with Min A verbal cues.  SLP Short Term Goal 5 (Week 4): Pt will use memory aide to recall informaiton from previous therapy sessions with Min A verbal cues.  SLP Short Term Goal 6 (Week 4): Pt will consume therapeutic trials of ice chips with minimal overt s/s of aspiration over 2 consecutive sessions to demonstrate readiness for repeat MBS.  SLP Short Term Goal 6 - Progress (Week 4): Discontinued (comment) (Replace with Pt will improve MEP by 10% to improve cough strength, decrease frequency of suctioning. )  Skilled Therapeutic Interventions: Skilled treatment focused on respiratory training with expiratory trainer set at 40 cmH20 with perceived exerted effort at 7. RMT initiated to increased cough strength, decreased suctioning and improve airway closure for improvement swallow function. [name] present and able to return demonstration of activity. SLP further facilitated by creating a memory folder to aid with pt's recall of salient information throughout day. Pt with scheduled Modified Barium Swallow Study scheduled for 03/25/16. Education provided to pt and daughter. Both are agreeable. Pt left with daughter and all needs within reach. Continue per current plan of care.        Function:  Cognition Comprehension Comprehension assist level: Follows basic conversation/direction with no assist  Expression   Expression assist level: Expresses basic needs/ideas: With extra time/assistive device  Social Interaction Social Interaction assist level: Interacts appropriately with others with medication or extra time (anti-anxiety, antidepressant).  Problem Solving Problem solving assist level: Solves basic problems with no assist  Memory Memory assist level: Recognizes or recalls 75 - 89% of the time/requires cueing 10 - 24% of the time    Pain    Therapy/Group: Individual Therapy   Madia Carvell B. Dreama Saaverton, M.S., CCC-SLP Speech-Language Pathologist   Andreus Cure 03/24/2016, 11:14 AM

## 2016-03-24 NOTE — Progress Notes (Signed)
Physical Therapy Session Note  Patient Details  Name: [name] MRN: [id number] Date of Birth: 12-03-1940  Today's Date: 03/24/2016 PT Individual Time: 1000-1100 and 1430 - 1518 PT Individual Time Calculation (min): 60 min and 48 min  Short Term Goals: Week 4:  PT Short Term Goal 1 (Week 4): = LTG due to estimated LOS  Skilled Therapeutic Interventions/Progress Updates:  Session 1: Pt was lying in bed after dependent cleaning of incontinent bowel. Pt was dependent with donning ted hoes, pants, and socks in supine with assistance from pt's daughter. Pt sat up to EOB with bed flat min A with verbal cues for handplacement and sequence. Pt tolerated 6 min of standing balance supervision with RW while playing checkers in TBI rehab room. The rest of therapy focused on family training with daughter, who demonstrated proper and safe guarding for transfers, ambulation, negotiating up/down curbs, and stairs. Pt ambulated 120 ft with RW supervision. Sit-to-stands throughout family training were supervision with Rw, while ambulatory transfers were min A with verbal cues for proper use of Rw. Pt ascended and descended 4 six inch steps Mod A while facing towards the R hand rail to mimic home environment. Daughter reports plan to install another rail at home to allow BUE support when entering and exiting the home. Pt was left sitting up in W/C with all needs within reach and daughter present in room .    Session 2: Pt was sitting up in W/C with wife and daughter present in room. Pt was complaining of no pain. Therapy session focused on continuing family education with daughter. Daughter demonstrated proper and safe guarding with ambulation, car transfer, and ambulatory transfers. Pt performed car transfer x 2 with therapist and with daughter Mod A with verbal cues for sequence, handplacement, and manual assist with lifting BLE. Pt ambulated 141 ft, 80 ft CGA with daughter using Rw. Pt self-propelled W/C  supervision 80 ft to room supervision. Pt required min A with ambulatory and sit-to-supine transfers to return to bed. Pt was left in bed with daughter and wife present in room and all needs within reach.    Therapy Documentation Precautions:  Precautions Precautions: Fall Precaution Comments: NPO, gastrostomy tube, LLQ JP drain, abdominal surgical incision with pouch Restrictions Weight Bearing Restrictions: No  Pain: Pain Assessment Pain Assessment: No/denies pain Pain Score: 0-No pain  See Function Navigator for Current Functional Status.   Therapy/Group: Individual Therapy  Rudie MeyerJeffrey Taveon Enyeart 03/24/2016, 7:52 AM

## 2016-03-24 NOTE — Progress Notes (Signed)
PHARMACY - TOTAL PARENTERAL NUTRITION CONSULT NOTE   Pharmacy Consult:  TPN Indication:  SBO and new EC fistula 02/13/16  Patient Measurements: Height: _0  (188 cm) Weight: 170 lb 3.1 oz (77.2 kg) IBW/kg (Calculated) : 82.2   Body mass index is 21.85 kg/m.  Assessment:  5 YOM with history of multiple procedures and extended hospitalization for recurrent partial GOO 2/2 pyloric stricture.  Patient was on TPN for several days, then discharged on 12/29/16 with J/G tube in place for TF. He was unable to tolerate TF and was transitioned back to TPN by [facility] on 01/05/17.  Patient was readmitted on 01/19/17 with partial SBO.  Patient failed MBS on 01/26/16 and did not tolerate bolus feeds through G-tube on 01/27/16. On 12/23, he underwent ex-lap with LoA, segmental SBR, and gastrojejunostomy. On 02/12/16, TF were found draining from midline incision with CT abd showing bowel perforation at [place] site and development of abscesses.   GI: EC fistula at SB jejunal anastomosis which is decreasing in size. Has had trouble advancing TFs so had been advancing very slowly per surgery. On 2/2 GJ tube needed to be replaced so TFs were held. Now TFs have been restarted and are running at 75m/hr. Surgery plans to leave at 350mhr until fistula closes - GTube out of place yesterday but IR confirmed placement is now good. Albumin low at 1.6 (down from 1.9), prealbumin ok at 28.9. Drain output remains high at 900 mL yesterday. Endo: no hx DM. CBGs 98-123. No SSI  Lytes: Na slightly low at 137, CoCa 9.3. Mg 1.6, Phos 3.9 Renal: SCr and BUN stable. NS at 10020mr x 10 hrs overnight Pulm: RA - nicotine patch Cards: orthostatic hypotension improved - VSS Hepatobil:AST and Alk Phos slightly elevated. ALT and Tbilli wnl. TG good at 131 (2/5) Neuro: Prozac, Xanax, lidocaine patch, PRN morphine, APAP TID Heme/Onc: S/p Iron dextran ID: S/p ceftriaxone for UTI. UCx negative. Afebrile, WBC wnl Best Practices: Lovenox,  [facility] TPN Access: PICC TPN start date: [facility] > [facility] 01/20/16 > [facility] 02/26/15  Home TPN: 2156 kCal and 75gm protein per day (405g CHO = 1376 kCal, 50g lipid = 480 kCal)  Nutritional Goals: per RD recs on 1/31 Kcal: 2100-2300 Protein: 95-110 g  Current Nutrition:  Clinimix E 5/15 + lipids Jevity 1.2 at 59m63m for 16 hrs (576 kcal and 27 g of protein) NPO except ice chips  Plan: Continue cyclic Clinimix E 5/154/38fuse 1560 ml over 12 hrs: 50 mL/hr x 1 hr, then 146 mL/hr x 10 hrs, then 50 mL/hr x 1 hr. Continue IV lipid emulsions 20ml52mover 12 hrs to help meet kcal goal since TG ok Continue Jevity 1.2 at 59ml/16mor 16 hrs per surgery TPN, IV lipid emulsions, and TFs provides 105 g of protein and 2164 kCals per day meeting 100% of protein and kCal needs Add MVI  in TPN Add TE (every other day) in TPN - due 2/9 Monitor TPN labs, Bmet F/U tolerance of TFs, surgery plans  MichaeLevester FreshmD, BCPS, BCCCP Clinical Pharmacist Clinical phone for 03/24/2016 from 7a-3:30p: [phone] 3:30p, please call main pharmacy at: [phone] 03/24/2016 8:05 AM

## 2016-03-25 ENCOUNTER — Inpatient Hospital Stay (HOSPITAL_COMMUNITY): Payer: Medicare Other | Admitting: Speech Pathology

## 2016-03-25 ENCOUNTER — Inpatient Hospital Stay (HOSPITAL_COMMUNITY): Payer: Medicare Other | Admitting: Occupational Therapy

## 2016-03-25 ENCOUNTER — Inpatient Hospital Stay (HOSPITAL_COMMUNITY): Payer: Medicare Other | Admitting: Physical Therapy

## 2016-03-25 ENCOUNTER — Inpatient Hospital Stay (HOSPITAL_COMMUNITY): Payer: Medicare Other

## 2016-03-25 LAB — GLUCOSE, CAPILLARY
GLUCOSE-CAPILLARY: 117 mg/dL — AB (ref 65–99)
Glucose-Capillary: 120 mg/dL — ABNORMAL HIGH (ref 65–99)

## 2016-03-25 MED ORDER — MAGNESIUM SULFATE 2 GM/50ML IV SOLN
2.0000 g | Freq: Once | INTRAVENOUS | Status: AC
  Administered 2016-03-25: 2 g via INTRAVENOUS
  Filled 2016-03-25: qty 50

## 2016-03-25 MED ORDER — FAT EMULSION 20 % IV EMUL
240.0000 mL | INTRAVENOUS | Status: AC
  Administered 2016-03-25: 240 mL via INTRAVENOUS
  Filled 2016-03-25: qty 250

## 2016-03-25 MED ORDER — JEVITY 1.2 CAL PO LIQD
1000.0000 mL | ORAL | Status: DC
  Administered 2016-03-25 – 2016-03-28 (×4): 1000 mL
  Filled 2016-03-25 (×5): qty 1000
  Filled 2016-03-25: qty 237

## 2016-03-25 MED ORDER — TRACE MINERALS CR-CU-MN-SE-ZN 10-1000-500-60 MCG/ML IV SOLN
INTRAVENOUS | Status: AC
  Administered 2016-03-25 (×2): via INTRAVENOUS
  Filled 2016-03-25 (×2): qty 1560

## 2016-03-25 MED ORDER — ERYTHROMYCIN ETHYLSUCCINATE 200 MG/5ML PO SUSR
400.0000 mg | Freq: Four times a day (QID) | ORAL | Status: DC
  Administered 2016-03-25 – 2016-03-30 (×18): 400 mg
  Filled 2016-03-25 (×22): qty 10

## 2016-03-25 NOTE — Progress Notes (Signed)
[place] PHYSICAL MEDICINE & REHABILITATION     PROGRESS NOTE  Subjective/Complaints: Pt seen laying in bed this AM.  He slept well overnight.  His TFs/IVF timing was changed due to drainage.  Per Surgery, okay to resume.   ROS: Denies vomiting, diarrhea, shortness of breath or chest pain.  Objective: Vital Signs: Blood pressure (!) 125/56, pulse 72, temperature 98.1 F (36.7 C), temperature source Oral, resp. rate 17, height 6\' 2"  (1.88 m), weight 77.2 kg (170 lb 3.1 oz), SpO2 98 %. No results found.  Recent Labs  03/24/16 0600  WBC 7.1  HGB 8.5*  HCT 26.9*  PLT 284    Recent Labs  03/24/16 0500  NA 137  K 3.9  CL 109  GLUCOSE 90  BUN 24*  CREATININE 0.58*  CALCIUM 7.4*   CBG (last 3)   Recent Labs  03/24/16 2023 03/25/16 0041 03/25/16 0342  GLUCAP 119* 120* 117*    Wt Readings from Last 3 Encounters:  03/24/16 77.2 kg (170 lb 3.1 oz)  02/26/16 78.6 kg (173 lb 4.5 oz)  01/19/16 79.2 kg (174 lb 8 oz)    Physical Exam:  BP (!) 125/56 (BP Location: Left Arm)   Pulse 72   Temp 98.1 F (36.7 C) (Oral)   Resp 17   Ht 6\' 2"  (1.88 m)   Wt 77.2 kg (170 lb 3.1 oz)   SpO2 98%   BMI 21.85 kg/m  Constitutional: NAD. Vital signs reviewed. Frail.  HENT: Normocephalic and atraumatic.  Eyes: EOMI. No discharge.  Cardiovascular: RRR. No JVD. Respiratory: CTA bilaterally. Unlabored.  GI: He exhibits no distension. BS+. +Eakins pouch. GJ tube. No pain to palpation                                                              Musculoskeletal: He exhibits no edema. He exhibits no tenderness.  Neurological: He is alert.  Speech with dysarthria Motor: 4/5 throughout (unchanged) Skin: Skin is warm and dry. Abdominal wounds with dressing.   Assessment/Plan: 1. Functional deficits secondary to debility and encephalopathy which require 3+ hours per day of interdisciplinary therapy in a comprehensive inpatient rehab setting. Physiatrist is providing close team  supervision and 24 hour management of active medical problems listed below. Physiatrist and rehab team continue to assess barriers to discharge/monitor patient progress toward functional and medical goals.  Function:  Bathing Bathing position Bathing activity did not occur: Refused Position: Wheelchair/chair at sink  Bathing parts Body parts bathed by patient: Right arm, Left arm, Buttocks, Right upper leg, Left upper leg, Right lower leg, Left lower leg, Front perineal area, Chest Body parts bathed by helper: Buttocks, Front perineal area  Bathing assist Assist Level: Touching or steadying assistance(Pt > 75%), Assistive device Assistive Device Comment: long-handled sponge Set up : To obtain items  Upper Body Dressing/Undressing Upper body dressing Upper body dressing/undressing activity did not occur: Safety/medical concerns What is the patient wearing?: Pull over shirt/dress     Pull over shirt/dress - Perfomed by patient: Thread/unthread right sleeve, Thread/unthread left sleeve, Put head through opening Pull over shirt/dress - Perfomed by helper: Pull shirt over trunk        Upper body assist Assist Level: Touching or steadying assistance(Pt > 75%)   Set up : To obtain  clothing/put away  Lower Body Dressing/Undressing Lower body dressing Lower body dressing/undressing activity did not occur: Safety/medical concerns What is the patient wearing?: Pants, Ted Hose, Shoes, Non-skid slipper socks     Pants- Performed by patient: Thread/unthread right pants leg, Thread/unthread left pants leg Pants- Performed by helper: Pull pants up/down, Thread/unthread right pants leg, Thread/unthread left pants leg Non-skid slipper socks- Performed by patient: Don/doff right sock Non-skid slipper socks- Performed by helper: Don/doff left sock, Don/doff right sock   Socks - Performed by helper: Don/doff right sock, Don/doff left sock   Shoes - Performed by helper: Don/doff right shoe, Don/doff  left shoe, Fasten right, Fasten left       TED Hose - Performed by helper: Don/doff right TED hose, Don/doff left TED hose  Lower body assist Assist for lower body dressing: 2 Helpers      Financial trader activity did not occur: No continent bowel/bladder event Toileting steps completed by patient: Adjust clothing prior to toileting Toileting steps completed by helper: Performs perineal hygiene, Adjust clothing after toileting Toileting Assistive Devices: Grab bar or rail  Toileting assist Assist level: Two helpers   Transfers Chair/bed transfer   Chair/bed transfer method: Ambulatory Chair/bed transfer assist level: Touching or steadying assistance (Pt > 75%) Chair/bed transfer assistive device: Armrests, Patent attorney     Max distance: 141 ft Assist level: Touching or steadying assistance (Pt > 75%)   Wheelchair   Type: Manual Max wheelchair distance: 80 ft Assist Level: Supervision or verbal cues  Cognition Comprehension Comprehension assist level: Follows basic conversation/direction with no assist  Expression Expression assist level: Expresses basic 75 - 89% of the time/requires cueing 10 - 24% of the time. Needs helper to occlude trach/needs to repeat words.  Social Interaction Social Interaction assist level: Interacts appropriately 75 - 89% of the time - Needs redirection for appropriate language or to initiate interaction.  Problem Solving Problem solving assist level: Solves basic 75 - 89% of the time/requires cueing 10 - 24% of the time  Memory Memory assist level: Recognizes or recalls 75 - 89% of the time/requires cueing 10 - 24% of the time    Medical Problem List and Plan: 1. Debility and encephalopathy secondary to small bowel obstruction and associated complications   Cont CIR  Cont NPO   No crushed meds through feeding tube  Enterocutaneous fistula, limiting tube feedings to 30 cc/h, cont TPN 2. DVT  Prophylaxis/Anticoagulation: Pharmaceutical: Lovenox  3. Pain Management: IV morphine every 4 hours prn severe pain  4. Mood: team to provide ego support. LCSW to follow for evaluation and support.   Prozac started 1/18 5. Neuropsych: This patient is not fully capable of making decisions on his own behalf.  6. Skin/Wound Care: Continue air mattress overlay. Continue large Eakin's pouch to manage wound drainage. WOC for management. Encourage side lying to help manage MASD.  7. Malnutrition/Fluids/Electrolytes/Nutrition: continue TPN.   Albumin down to 1.6 on 2/8  Plan for swallow today, however, pt will likely not be able to sustain nutrition, even if he passes swallow 8. Abdominal abscess: Leucocytosis resolved. Completed rocephin 1/17 9. Anemia of chronic illness:   Hb 8.5 on 2/8  Cont to monitor 10. Electrolyte abnormality/Acute renal insufficiency: Managed by TNA   Mag stable 1.6 2/8 11. Encephalopathy: Improving.  12. Hyperglycemia  Overall controlled 2/9 13 Sleep disturbance  Has tried Haldol without benefit, ativan causes hallucinations  Continue Xanax elixer,  Improved 14. Mild hyponatremia: Improved  Na+ 137 on 2/8 15. Secondary HTN  Improved 2/9  Cont to monitor  LOS (Days) 28 A FACE TO FACE EVALUATION WAS PERFORMED  Ankit Karis Jubanil [name] 03/25/2016 8:45 AM

## 2016-03-25 NOTE — Progress Notes (Signed)
Speech Language Pathology Note  Patient Details  Name: [name] MRN: [id number] Date of Birth: 07/08/1940 Today's Date: 03/25/2016  MBSS complete see report under imaging section.  Sophina Mitten B. Dreama Saaverton, M.S., CCC-SLP Speech-Language Pathologist    Hodari Chuba 03/25/2016, 12:55 PM

## 2016-03-25 NOTE — Progress Notes (Signed)
Occupational Therapy Session Note  Patient Details  Name: [name] MRN: [id number] Date of Birth: [birth year]  Today's Date: 03/25/2016 OT Individual Time: 1300-1400 OT Individual Time Calculation (min): 60 min    Short Term Goals: Week 4:  OT Short Term Goal 1 (Week 4): LTG=STG 2/2 estimated dc date  Skilled Therapeutic Interventions/Progress Updates:    1:1 OT session focused on LB dressing techniques using ADL Ae, activity tolerance, and pt/family education. Pt in the bathroom with spouse upon OT arrival. Pt required assistance attaching brief and managing clothing after toileting. Per spouse report, pt was able to toilet himself. Pt then sat EOB and OT demonstrated technique for donning socks using sock-aid. Pt able to doff socks by crossing ankle over knee, but used sock-aid with min A to don socks. Pt then utilized shoe horn with OT instruction and min A to don shoes. Pt then able to tie shoes crossing ankle over knee with increased time and 2 rest breaks. Pt then ambulated to day room with close supervision. Educated on safety when transitioning from tile to carpeted surfaces. Verbal cues to clear foot when walking on carpet. Pt returned to room at end of session and left seated EOB with family present and needs met.   Therapy Documentation Precautions:  Precautions Precautions: Fall Precaution Comments: NPO, gastrostomy tube, LLQ JP drain, abdominal surgical incision with pouch Restrictions Weight Bearing Restrictions: No Pain: Pain Assessment Pain Assessment: No/denies pain     See Function Navigator for Current Functional Status.   Therapy/Group: Individual Therapy  Valma Cava 03/25/2016, 2:02 PM

## 2016-03-25 NOTE — Progress Notes (Signed)
Speech Language Pathology Daily Session Note  Patient Details  Name: [name] MRN: [id number] Date of Birth: 1940-04-30  Today's Date: 03/25/2016 SLP Individual Time: 0945-1000 SLP Individual Time Calculation (min): 15 min  Short Term Goals: Week 4: SLP Short Term Goal 1 (Week 4): Pt will utilize speech intelligibility strategies at the sentence level with Min A verbal cues to achieve  ~90% intelligibility.  SLP Short Term Goal 2 (Week 4): Pt will complete semi-complex tasks with Min A verbal cues. SLP Short Term Goal 3 (Week 4): Pt will sustain attention to functional tasks for ~ 30 minutes with supervision cues.  SLP Short Term Goal 4 (Week 4): Pt will utilize external memory aids to recall new, daily information with Min A verbal cues.  SLP Short Term Goal 5 (Week 4): Pt will use memory aide to recall informaiton from previous therapy sessions with Min A verbal cues.  SLP Short Term Goal 6 (Week 4): Pt will consume therapeutic trials of ice chips with minimal overt s/s of aspiration over 2 consecutive sessions to demonstrate readiness for repeat MBS.  SLP Short Term Goal 6 - Progress (Week 4): Discontinued (comment) (Replace with Pt will improve MEP by 10% to improve cough strength, decrease frequency of suctioning. )  Skilled Therapeutic Interventions: Skilled treatment focused on dysphagia therapy. SLP facilitated session by providing extensive education on results of Modified Barium Swallow Study. Information updated in memory book for pt recall. Pt left in bed with all needs within reach. Continue per plan of care.      Function:  Eating Eating   Modified Consistency Diet: Yes (Modified Barium Swallow Study)             Cognition Comprehension Comprehension assist level: Follows basic conversation/direction with no assist  Expression   Expression assist level: Expresses basic 75 - 89% of the time/requires cueing 10 - 24% of the time. Needs helper to occlude trach/needs  to repeat words.  Social Interaction Social Interaction assist level: Interacts appropriately 75 - 89% of the time - Needs redirection for appropriate language or to initiate interaction.  Problem Solving Problem solving assist level: Solves basic 75 - 89% of the time/requires cueing 10 - 24% of the time  Memory Memory assist level: Recognizes or recalls 75 - 89% of the time/requires cueing 10 - 24% of the time    Pain    Therapy/Group: Individual Therapy  [name] 03/25/2016, 12:56 PM

## 2016-03-25 NOTE — Progress Notes (Signed)
Physical Therapy Weekly Progress Note  Patient Details  Name: [name] MRN: [id number] Date of Birth: 06/20/1940  Beginning of progress report period: March 18, 2016 End of progress report period: March 25, 2016  Today's Date: 03/25/2016 PT Individual Time: 1000-1045 and 14:05-15:03 PT Individual Time Calculation (min): 45 min and 58 min  Patient has met  7 of 9 long term goals.  Pt is S-Min A overall with functional mobility using RW with increased assistance needed when fatigued. Initiated family training with daughter this week.  Patient continues to demonstrate the following deficits muscle weakness, decreased cardiorespiratoy endurance and decreased standing balance, decreased postural control and decreased balance strategies and therefore will continue to benefit from skilled PT intervention to increase functional independence with mobility.  Patient progressing toward long term goals.  Continue plan of care.  PT Short Term Goals Week 5:  PT Short Term Goal 1 (Week 5): = LTG due to anticipated LOS  Skilled Therapeutic Interventions/Progress Updates:  Session 1:  Pt was lying in bed upon arrival. Pt was not complaining of any pain before or during therapy. Pt unaware of bladder incontinence and required assistance with toileting. Pt was supervision for transfer from supine to sit with HOB elevated and use of bedrails and to stand up with Rw. Pt was supervision with ambulating 10 ft to toilet. Pt required total assistance with doffing wet brief and donning new brief after tolieting. Pt ambulated 100 ft, 80 ft supervision with RW . Pt performed the TUG outcome measure and scored 31 seconds, indicating a high risk for falling. Pt performed 10 sit-to-stands with RW before returning to room. Pt was left in W/C with all needs within reach.  Session 2:  Pt was sitting at EOB with family present in room. Pt wasn't complaining of any pain prior to or during our PT session. Pt  performed sit-to-stand and ambulation of 8 ft to sit in w/c supervision. Stand pivot, ambulatory, and sit-to-stand transfers throughout therapy was supervision with min cues for safe use of Rw. Pt performed 10 min total on Nustep level 3 with several rest breaks. Pt ambulated 20 ft supervision with Rw. Pt ascended and descended forward 4 six inch steps twice with BUE support and close supervision. Pt was instructed through standing BLE therapeutic exercises, which included 1 x 5 reps of LAQ, standing knee flexions, standing mini-squats, standing hip abduction. Pt was returned to room and required min A to position into bed. Pt was left in bed with all needs within reach.      Therapy Documentation Precautions:  Precautions Precautions: Fall Precaution Comments: NPO, gastrostomy tube, LLQ JP drain, abdominal surgical incision with pouch Restrictions Weight Bearing Restrictions: No   See Function Navigator for Current Functional Status.  Therapy/Group: Individual Therapy    03/25/2016, 7:50 AM  

## 2016-03-25 NOTE — Progress Notes (Signed)
PHARMACY - TOTAL PARENTERAL NUTRITION CONSULT NOTE   Pharmacy Consult:  TPN Indication:  SBO and new EC fistula 02/13/16  Patient Measurements: Height: 6' 2"  (188 cm) Weight: 170 lb 3.1 oz (77.2 kg) IBW/kg (Calculated) : 82.2   Body mass index is 21.85 kg/m.  Assessment:  52 YOM with history of multiple procedures and extended hospitalization for recurrent partial GOO 2/2 pyloric stricture.  Patient was on TPN for several days, then discharged on 12/29/16 with J/G tube in place for TF. He was unable to tolerate TF and was transitioned back to TPN by [facility] on 01/05/17.  Patient was readmitted on 01/19/17 with partial SBO.  Patient failed MBS on 01/26/16 and did not tolerate bolus feeds through G-tube on 01/27/16. On 12/23, he underwent ex-lap with LoA, segmental SBR, and gastrojejunostomy. On 02/12/16, TF were found draining from midline incision with CT abd showing bowel perforation at [place] site and development of abscesses.   GI: EC fistula at SB jejunal anastomosis which is decreasing in size. Has had trouble advancing TFs so had been advancing very slowly per surgery. Surgery plans to leave at 35m/hr until fistula closes . TF resumed at 30 ml/hr per surgery  This morning.  Albumin low at 1.6 (down from 1.9), prealbumin ok at 28.9. Drain output remains high at 1200 mL yesterday. Endo: no hx DM. CBGs 98-123. No SSI  Lytes:no labs today, Mag was 1.6 yesterday Renal: SCr and BUN stable. NS at 100 ml/hr daily for 10 hours Pulm: RA - nicotine patch Cards: orthostatic hypotension improved - VSS Hepatobil:AST and Alk Phos slightly elevated. ALT and Tbilli wnl. TG good at 131 (2/5) Neuro: Prozac, Xanax, lidocaine patch, PRN morphine, APAP TID Heme/Onc: S/p Iron dextran ID: S/p ceftriaxone for UTI. UCx negative. Afebrile, WBC wnl Best Practices: Lovenox, MC TPN Access: PICC TPN start date: P[facility] > [facility] 01/20/16 > [facility] 02/26/15  Home TPN: 2156 kCal and 75gm protein per day (405g CHO = 1376 kCal, 50g  lipid = 480 kCal)  Nutritional Goals: per RD recs on 1/31 Kcal: 2100-2300 Protein: 95-110 g  Current Nutrition:  Clinimix E 5/15 + lipids Jevity 1.2 at 35mhr for 16 hrs (576 kcal and 27 g of protein) NPO except ice chips  Plan: Continue cyclic Clinimix E 5/9/31Infuse 1560 ml over 12 hrs: 50 mL/hr x 1 hr, then 146 mL/hr x 10 hrs, then 50 mL/hr x 1 hr. Continue IV lipid emulsions 2070mr over 12 hrs to help meet kcal goal since TG ok Continue Jevity 1.2 at 68m37m for 16 hrs per surgery TPN, IV lipid emulsions, and TFs provides 105 g of protein and 2164 kCals per day meeting 100% of protein and kCal needs Magnesium 2 gm bolus- recheck on Monday  MVI in TPN Add TE (every other day) in TPN - due 2/9 Monitor TPN labs, Bmet F/U tolerance of TFs, surgery plans DC CBGs  MichEudelia Buncharm.D. [phone] phone for 03/25/2016 from 7a-3:30p: [phone] 3:30p, please call main pharmacy at: [phone] 03/25/2016 8:49 AM

## 2016-03-25 NOTE — Plan of Care (Signed)
Problem: RH SKIN INTEGRITY Goal: RH STG SKIN FREE OF INFECTION/BREAKDOWN Remain free from skin breakdown while on rehab unit with min assist  Outcome: Progressing Wound healing observed to be improving.

## 2016-03-25 NOTE — Progress Notes (Signed)
Subjective: Pt without complaint   Objective: Vital signs in last 24 hours: Temp:  [98.1 F (36.7 C)] 98.1 F (36.7 C) (02/09 0340) Pulse Rate:  [72] 72 (02/09 0340) Resp:  [17] 17 (02/09 0340) BP: (125)/(56) 125/56 (02/09 0340) SpO2:  [98 %] 98 % (02/09 0340) Last BM Date: 03/24/16  Intake/Output from previous day: 02/08 0701 - 02/09 0700 In: 6205.6 [I.V.:5557.6; NG/GT:648] Out: 1201 [Drains:1200; Stool:1] Intake/Output this shift: Total I/O In: 39.5 [NG/GT:39.5] Out: -   Incision/Wound:eakins pouch in place minimal output  Enteric tube in place     Lab Results:   Recent Labs  03/24/16 0600  WBC 7.1  HGB 8.5*  HCT 26.9*  PLT 284   BMET  Recent Labs  03/24/16 0500  NA 137  K 3.9  CL 109  CO2 21*  GLUCOSE 90  BUN 24*  CREATININE 0.58*  CALCIUM 7.4*   PT/INR No results for input(s): LABPROT, INR in the last 72 hours. ABG No results for input(s): PHART, HCO3 in the last 72 hours.  Invalid input(s): PCO2, PO2  Studies/Results: No results found.  Anti-infectives: Anti-infectives    Start     Dose/Rate Route Frequency Ordered Stop   03/16/16 2200  erythromycin ethylsuccinate (EES) 200 MG/5ML suspension 200 mg    Comments:  Give via G tube   200 mg Per Tube Every 8 hours 03/16/16 1746     03/14/16 2000  erythromycin ethylsuccinate (EES) 200 MG/5ML suspension 200 mg  Status:  Discontinued    Comments:  Give via G tube   200 mg Per Tube Every 12 hours 03/14/16 0805 03/16/16 1746   03/08/16 1730  erythromycin ethylsuccinate (EES) 200 MG/5ML suspension 200 mg  Status:  Discontinued     200 mg Per Tube Every 8 hours 03/08/16 1634 03/14/16 0805   02/29/16 2000  cefTRIAXone (ROCEPHIN) 2 g in dextrose 5 % 50 mL IVPB     2 g 100 mL/hr over 30 Minutes Intravenous Every 24 hours 02/29/16 1804 03/02/16 2017   02/27/16 2000  cefTRIAXone (ROCEPHIN) 2 g in dextrose 5 % 50 mL IVPB  Status:  Discontinued     2 g 100 mL/hr over 30 Minutes Intravenous Every 24  hours 02/26/16 2137 02/29/16 1804      Assessment/Plan: Patient Active Problem List   Diagnosis Date Noted  . Secondary hypertension   . Gastrojejunostomy tube dislodgement (HCC)   . Gastric dysmotility   . Post-operative state   . Hypoalbuminemia due to protein-calorie malnutrition (HCC)   . Gastrojejunostomy tube 02/06/2016 03/10/2016  . Jejunal anastomotic leak 03/10/2016  . Hyponatremia   . Anxiety and depression   . Sleep disturbance   . Orthostasis   . Leukocytosis   . Hyperglycemia   . FTT (failure to thrive) in adult   . Post-operative pain   . 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 Anemia of chronic disease 01/31/2016  . Stage 2 skin ulcer of sacral region 01/31/2016  . Hypomagnesemia 01/21/2016  . Hypokalemia 01/21/2016  . Pressure injury of skin 01/20/2016  . Pleural effusion   . Gastroesophageal reflux disease 01/19/2016  . Insomnia 01/19/2016  . On total parenteral nutrition (TPN) 01/17/2016  . Metabolic acidosis 01/17/2016  . Skin ulcer of abdominal wall, limited to breakdown of skin (HCC) 01/17/2016  . Dysphagia 01/17/2016  . Debility 01/17/2016  . 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 Vitamin B12 deficiency 01/02/2016  . BPH (benign prostatic hyperplasia)   . Posterior  vitreous detachment   . Salzmann's nodular dystrophy of left eye   . Hyperopia of both eyes with astigmatism and presbyopia   . Central pterygium of right eye   . Pressure ulcer of buttock, right, unstageable (HCC) 12/30/2015  . Hypernatremia 12/28/2015  . Tobacco abuse 12/09/2015  . Gastritis 12/09/2015  . Acute respiratory failure with hypoxemia (HCC)   . Protein-calorie malnutrition, severe 12/04/2015  . Encephalopathy acute 12/03/2015  . Anxiety state 09/28/2015  . Hyperlipidemia   . 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 Coronary atherosclerosis of native coronary artery 04/24/2013  .  Essential hypertension, benign 04/24/2013  . Other and unspecified hyperlipidemia 04/24/2013  . Central pterygium 10/30/2012  . 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 Cataract, nuclear sclerotic senile 10/30/2012    Ok to restart TF   With goal of 30 cc /hr  Fistula seems to be draining less    LOS: 28 days    [name] A. 03/25/2016

## 2016-03-26 ENCOUNTER — Inpatient Hospital Stay (HOSPITAL_COMMUNITY): Payer: Medicare Other | Admitting: Physical Therapy

## 2016-03-26 DIAGNOSIS — R1313 Dysphagia, pharyngeal phase: Secondary | ICD-10-CM

## 2016-03-26 MED ORDER — CLINIMIX E/DEXTROSE (5/15) 5 % IV SOLN
INTRAVENOUS | Status: AC
  Administered 2016-03-26: 18:00:00 via INTRAVENOUS
  Filled 2016-03-26 (×2): qty 1560

## 2016-03-26 MED ORDER — ADULT MULTIVITAMIN LIQUID CH
15.0000 mL | Freq: Every day | ORAL | Status: DC
  Administered 2016-03-26 – 2016-03-30 (×5): 15 mL
  Filled 2016-03-26 (×5): qty 15

## 2016-03-26 MED ORDER — FAT EMULSION 20 % IV EMUL
240.0000 mL | INTRAVENOUS | Status: AC
  Administered 2016-03-26: 240 mL via INTRAVENOUS
  Filled 2016-03-26: qty 250

## 2016-03-26 NOTE — Progress Notes (Signed)
[facility]ICINE & [facility]ILITATION     PROGRESS NOTE  Subjective/Complaints: Pt seen laying in bed this AM. He states he slept well overnight.  Spoke to night and day nurse regarding TFs.  ROS: Denies vomiting, diarrhea, shortness of breath or chest pain.  Objective: Vital Signs: Blood pressure (!) 121/53, pulse 82, temperature 98.5 F (36.9 C), temperature source Oral, resp. rate 16, height 6\' 2"  (1.88 m), weight 68.4 kg (150 lb 12.8 oz), SpO2 94 %. Dg Swallowing Func-speech Pathology  Result Date: 03/25/2016 Objective Swallowing Evaluation: Type of Study: MBS-Modified Barium Swallow Study Patient Details Name: LOUDEN HOUSEWORTH MRN: [id number] Date of Birth: 1940-04-15 Today's Date: 03/25/2016 Time: SLP Start Time (ACUTE ONLY): 1100-SLP Stop Time (ACUTE ONLY): 1130 SLP Time Calculation (min) (ACUTE ONLY): 30 min Past Medical History: Past Medical History: Diagnosis Date . Aspiration pneumonia (HCC) 01/17/2016 . Bilateral dry eyes  . Bile peritonitis due to gastric tube dislodgement 12/03/2015 . BPH (benign prostatic hyperplasia)  . Central pterygium of right eye  . Chronic respiratory failure with hypoxia (HCC)  . Coronary artery disease 1997, 2009  bare mental stent right coronart artery, occlusive followed by Dr. Garnette Scheuermann in [place] . Degenerative disc disease   LOWER BACK . Dysphonia  . ED (erectile dysfunction)  . Gastric outlet obstruction 11/24/2015 . Gastroesophageal reflux disease  . Hyperlipidemia  . Hyperopia of both eyes with astigmatism and presbyopia  . Hypertension  . Ischemic right index finger at tip 12/09/2015 . Nuclear senile cataract   Dr. [facility] in [place] . Peptic ulcer  . Pneumatosis of intestines 12/11/2015 . Pneumonia   HISTORY OF IN CHILDHOOD . Posterior vitreous detachment 10/30/2012 . Pyloric stricture 11/24/2015 . Salzmann's nodular dystrophy of left eye  . SBO (small bowel obstruction) 01/20/2016 . Urinary hesitancy  . Wears glasses  Past  Surgical History: Past Surgical History: Procedure Laterality Date . CARDIAC CATHETERIZATION   . CHOLECYSTECTOMY   . ESOPHAGOGASTRODUODENOSCOPY N/A 12/01/2015  Procedure: ESOPHAGOGASTRODUODENOSCOPY (EGD) BALLOON DILATION OF DUODENAL STRICTURE;  Surgeon: Karie Soda, MD;  Location: [facility];  Service: General;  Laterality: N/A; . ESOPHAGOGASTRODUODENOSCOPY N/A 12/14/2015  Procedure: ESOPHAGOGASTRODUODENOSCOPY (EGD);  Surgeon: Vida Rigger, MD;  Location: [facility];  Service: Endoscopy;  Laterality: N/A;  Patient has tracheostomy . ESOPHAGOGASTRODUODENOSCOPY (EGD) WITH PROPOFOL N/A 07/01/2015  Procedure: ESOPHAGOGASTRODUODENOSCOPY (EGD) WITH PROPOFOL;  Surgeon: Willis Modena, MD;  Location: [facility];  Service: Endoscopy;  Laterality: N/A; . ESOPHAGOGASTRODUODENOSCOPY (EGD) WITH PROPOFOL N/A 11/23/2015  Procedure: ESOPHAGOGASTRODUODENOSCOPY (EGD) WITH PROPOFOL;  Surgeon: Willis Modena, MD;  Location: [facility];  Service: Endoscopy;  Laterality: N/A;  may need to intubate . EYE SURGERY    growth on right eye removed  . IR GENERIC HISTORICAL  12/13/2015  IR REPLC DUODEN/JEJUNO TUBE PERCUT W/FLUORO 12/13/2015 [name], MD [facility] . IR GENERIC HISTORICAL  02/03/2016  IR REPLC GASTRO/COLONIC TUBE PERCUT W/FLUORO 02/03/2016 Berdine Dance, MD [facility] . IR GENERIC HISTORICAL  02/04/2016  IR GASTR TUBE CONVERT GASTR-JEJ PER W/FL MOD SED 02/04/2016 Malachy Moan, MD [facility] . IR GENERIC HISTORICAL  02/25/2016  IR SINUS/FIST TUBE CHK-NON GI 02/25/2016 Irish Lack, MD [facility] . IR GENERIC HISTORICAL  03/18/2016  IR CM INJ ANY COLONIC TUBE W/FLUORO 03/18/2016 Simonne Come, MD [facility] . IR GENERIC HISTORICAL  03/18/2016  IR GJ TUBE CHANGE 03/18/2016 Simonne Come, MD [facility] . IR GENERIC HISTORICAL  03/22/2016  IR CM INJ ANY COLONIC TUBE W/FLUORO 03/22/2016 Simonne Come, MD [facility] . LAPAROSCOPIC GASTROSTOMY  N/A 12/01/2015  Procedure: LAPAROSCOPIC PLACEMENT OF FEEDING JEJUNOSTOMY AND GASTROSTOMY  TUBE;  Surgeon: Karie Soda, MD;  Location: [facility];  Service: General;  Laterality: N/A; . LAPAROSCOPY N/A 12/03/2015  Procedure: LAPAROSCOPY DIAGNOSTIC, OMENTOPEXY, JEJUNOSTOMY, WASH OUT;  Surgeon: Karie Soda, MD;  Location: [facility];  Service: General;  Laterality: N/A; . LAPAROTOMY N/A 02/06/2016  Procedure: EXPLORATORY LAPAROTOMY, LYSIS OF ADHESIONS, SEGMENTAL SMALL BOWEL RESECTION, GASTROJEJUNOSTOMY;  Surgeon: Chevis Pretty III, MD;  Location: [facility];  Service: General;  Laterality: N/A; . STENT TO HEART    2 1997 AND 1 IN 1996 . TONSILLECTOMY   . XI ROBOTIC VAGOTOMY AND ANTRECTOMY N/A 09/25/2015  Procedure: XI ROBOTIC ANTERIOR AND POSTERIOR VAGOTOMY, BILROTH I  ANASTOMOSIS DOR FUNDIPLICATION, OMENTOPEXY  UPPER ENDOSCOPY;  Surgeon: Karie Soda, MD;  Location: [facility];  Service: General;  Laterality: N/A; HPI: MCKENZIE TORUNO is a 76 y.o. male with h/o CAD and GERD who presents to the Emergency Department from [facility] by EMS complaining of nausea, vomiting, and diarrhea onset onset two days ago, worsening yesterday. Pt also has associated mild abdominal pain. Per daughter, pt has been admitted to the hospital recently for surgery in his abdomen; he has been staying at [facility] since then. Daughter reported to MD that pt has been fed through a G tube which gave him diarrhea that eventually resolved with a change in formula.   Daughter also informed MD that pt gradually started feeding by mouth again x2 days with return of diarrhea along with nausea and vomiting. Daughter notes that the facility staff have tried to change his formula but with no relief to his symptoms.  Pt has known h/o dysphagia dysphonia, pna, peptic ulcer, GERD.  Last MBS done 12/29/15 showed moderate oropharyngeal dysphagia that looked marginally worse than test ono 12/17/15.  Pt left on ice chips only after oral care.  CXR 12/6 showed LLL consolidation ? ATX or pna.   Subjective: pt awake in chair Assessment / Plan /  Recommendation CHL IP CLINICAL IMPRESSIONS 03/25/2016 Therapy Diagnosis Moderate pharyngeal phase dysphagia Clinical Impression Pt presents with moderate sensorimotor pharyngeal dysphagia resulting in silent aspiration of nectar thick liquids. Swallow function continues to be delayed and weak complicated by very weak cough mechanism. This results in delayed swallow initiaiton to the valluclua, intermittent flash penetration, one occurance of shallow frank penetration after the swallow and significant residue at the pyriform sinuses that was silently aspirated after the swallow with nectar thick liquids. Pt unable to clear pharyngeal residue with cued cough or double swallow. After 4 nectar thick boluses, pt gagged  and hocked up pharyngeal residue. Two trials of thin liquids were presented via spoon with what appeared to be timely swallow initiation. Safety with thin liquids was difficult to assess as pt began to immediately excessively wretch. Trials were discontinued as excessive wretching continued. Pt didn't produce any vomitous. These same wretching behaviors are present in ST session after consuming 2 to 3 tsp trials of thin liquids. If wretching had not taken place, swallow study could have continued with further trials of thin liquids. Pt continues to be at high risk for aspiration complicated by GI issues that are present after ~ 6 tsp boluses of liquids. As a results, I recommend that pt remain NPO with small sips of thin H2O allowed under free water protocol as pt tolerates. Pt would also benefit from respiratory muscle training to strengthen cough and speech intensity. Education provided to PA. Of note,  pt with wretching during ST session after only 3 to 4 tsp trials. Pt appears to have a gastrointestinal component that will affect his ability to return to PO intake for nutrition.  Impact on safety and function Risk for inadequate nutrition/hydration;Moderate aspiration risk   CHL IP TREATMENT  RECOMMENDATION 03/25/2016 Treatment Recommendations Therapy as outlined in treatment plan below   Prognosis 03/25/2016 Prognosis for Safe Diet Advancement Guarded Barriers to Reach Goals Cognitive deficits;Time post onset;Severity of deficits;Other (Comment) Barriers/Prognosis Comment -- CHL IP DIET RECOMMENDATION 03/25/2016 SLP Diet Recommendations Free water protocol after oral care Liquid Administration via Spoon Medication Administration Via alternative means Compensations Small sips/bites Postural Changes Remain semi-upright after after feeds/meals (Comment);Seated upright at 90 degrees   CHL IP OTHER RECOMMENDATIONS 03/25/2016 Recommended Consults Consider GI evaluation Oral Care Recommendations Oral care QID Other Recommendations Have oral suction available   CHL IP FOLLOW UP RECOMMENDATIONS 03/25/2016 Follow up Recommendations Skilled Nursing facility   [facility] IP FREQUENCY AND DURATION 01/25/2016 Speech Therapy Frequency (ACUTE ONLY) min 2x/week Treatment Duration 1 week      CHL IP ORAL PHASE 03/25/2016 Oral Phase WFL Oral - Pudding Teaspoon -- Oral - Pudding Cup -- Oral - Honey Teaspoon -- Oral - Honey Cup -- Oral - Nectar Teaspoon WFL Oral - Nectar Cup WFL Oral - Nectar Straw NT Oral - Thin Teaspoon WFL Oral - Thin Cup WFL Oral - Thin Straw NT Oral - Puree NT Oral - Mech Soft NT Oral - Regular -- Oral - Multi-Consistency -- Oral - Pill -- Oral Phase - Comment --  CHL IP PHARYNGEAL PHASE 03/25/2016 Pharyngeal Phase Impaired Pharyngeal- Pudding Teaspoon -- Pharyngeal -- Pharyngeal- Pudding Cup -- Pharyngeal -- Pharyngeal- Honey Teaspoon -- Pharyngeal -- Pharyngeal- Honey Cup -- Pharyngeal -- Pharyngeal- Nectar Teaspoon Delayed swallow initiation-vallecula;Penetration/Apiration after swallow;Significant aspiration (Amount);Pharyngeal residue - pyriform;Pharyngeal residue - valleculae;Pharyngeal residue - posterior pharnyx;Reduced pharyngeal peristalsis Pharyngeal Material enters airway, passes BELOW cords without attempt by  patient to eject out (silent aspiration) Pharyngeal- Nectar Cup Delayed swallow initiation-vallecula;Reduced pharyngeal peristalsis;Penetration/Apiration after swallow;Significant aspiration (Amount);Pharyngeal residue - pyriform;Pharyngeal residue - valleculae;Pharyngeal residue - posterior pharnyx;Compensatory strategies attempted (with notebox) Pharyngeal Material enters airway, passes BELOW cords without attempt by patient to eject out (silent aspiration) Pharyngeal- Nectar Straw NT Pharyngeal -- Pharyngeal- Thin Teaspoon Delayed swallow initiation-vallecula;Pharyngeal residue - pyriform Pharyngeal Material enters airway, remains ABOVE vocal cords then ejected out Pharyngeal- Thin Cup NT Pharyngeal -- Pharyngeal- Thin Straw NT Pharyngeal -- Pharyngeal- Puree NT Pharyngeal -- Pharyngeal- Mechanical Soft -- Pharyngeal -- Pharyngeal- Regular NT Pharyngeal -- Pharyngeal- Multi-consistency -- Pharyngeal -- Pharyngeal- Pill -- Pharyngeal -- Pharyngeal Comment --  CHL IP CERVICAL ESOPHAGEAL PHASE 01/25/2016 Cervical Esophageal Phase Impaired Pudding Teaspoon -- Pudding Cup -- Honey Teaspoon -- Honey Cup -- Nectar Teaspoon Reduced cricopharyngeal relaxation Nectar Cup Reduced cricopharyngeal relaxation Nectar Straw -- Thin Teaspoon Reduced cricopharyngeal relaxation Thin Cup Reduced cricopharyngeal relaxation Thin Straw Reduced cricopharyngeal relaxation Puree Reduced cricopharyngeal relaxation;Esophageal backflow into cervical esophagus Mechanical Soft -- Regular -- Multi-consistency -- Pill -- Cervical Esophageal Comment -- No flowsheet data found. Happi B. Dreama Saa, M.S., CCC-SLP Speech-Language Pathologist Happi Overton 03/25/2016, 12:53 PM               Recent Labs  03/24/16 0600  WBC 7.1  HGB 8.5*  HCT 26.9*  PLT 284    Recent Labs  03/24/16 0500  NA 137  K 3.9  CL 109  GLUCOSE 90  BUN 24*  CREATININE 0.58*  CALCIUM 7.4*  CBG (last 3)   Recent Labs  03/24/16 2023 03/25/16 0041  03/25/16 0342  GLUCAP 119* 120* 117*    Wt Readings from Last 3 Encounters:  03/26/16 68.4 kg (150 lb 12.8 oz)  02/26/16 78.6 kg (173 lb 4.5 oz)  01/19/16 79.2 kg (174 lb 8 oz)    Physical Exam:  BP (!) 121/53 (BP Location: Left Arm)   Pulse 82   Temp 98.5 F (36.9 C) (Oral)   Resp 16   Ht 6\' 2"  (1.88 m)   Wt 68.4 kg (150 lb 12.8 oz)   SpO2 94%   BMI 19.36 kg/m  Constitutional: NAD. Vital signs reviewed. Frail.  HENT: Normocephalic and atraumatic.  Eyes: EOMI. No discharge.  Cardiovascular: RRR. No JVD. Respiratory: CTA bilaterally. Unlabored.  GI: He exhibits no distension. BS+. +Eakins pouch. GJ tube. No pain to palpation                                                              Musculoskeletal: He exhibits no edema. He exhibits no tenderness.  Neurological: He is alert.  Speech with dysarthria Motor: 4/5 throughout (stable) Skin: Skin is warm and dry. Abdominal wounds with dressing.   Assessment/Plan: 1. Functional deficits secondary to debility and encephalopathy which require 3+ hours per day of interdisciplinary therapy in a comprehensive inpatient rehab setting. Physiatrist is providing close team supervision and 24 hour management of active medical problems listed below. Physiatrist and rehab team continue to assess barriers to discharge/monitor patient progress toward functional and medical goals.  Function:  Bathing Bathing position Bathing activity did not occur: Refused Position: Wheelchair/chair at sink  Bathing parts Body parts bathed by patient: Right arm, Left arm, Buttocks, Right upper leg, Left upper leg, Right lower leg, Left lower leg, Front perineal area, Chest Body parts bathed by helper: Buttocks, Front perineal area  Bathing assist Assist Level: Touching or steadying assistance(Pt > 75%), Assistive device Assistive Device Comment: long-handled sponge Set up : To obtain items  Upper Body Dressing/Undressing Upper body dressing Upper body  dressing/undressing activity did not occur: Safety/medical concerns What is the patient wearing?: Pull over shirt/dress     Pull over shirt/dress - Perfomed by patient: Thread/unthread right sleeve, Thread/unthread left sleeve, Put head through opening Pull over shirt/dress - Perfomed by helper: Pull shirt over trunk        Upper body assist Assist Level: Touching or steadying assistance(Pt > 75%)   Set up : To obtain clothing/put away  Lower Body Dressing/Undressing Lower body dressing Lower body dressing/undressing activity did not occur: Safety/medical concerns What is the patient wearing?: Pants, Ted Hose, Shoes, Non-skid slipper socks     Pants- Performed by patient: Thread/unthread right pants leg, Thread/unthread left pants leg Pants- Performed by helper: Pull pants up/down, Thread/unthread right pants leg, Thread/unthread left pants leg Non-skid slipper socks- Performed by patient: Don/doff right sock Non-skid slipper socks- Performed by helper: Don/doff left sock, Don/doff right sock   Socks - Performed by helper: Don/doff right sock, Don/doff left sock   Shoes - Performed by helper: Don/doff right shoe, Don/doff left shoe, Fasten right, Fasten left       TED Hose - Performed by helper: Don/doff right TED hose, Don/doff left TED hose  Lower body assist Assist for lower body  dressing: 2 Helpers      Toileting Toileting Toileting activity did not occuFinancial traderr: No continent bowel/bladder event Toileting steps completed by patient: Performs perineal hygiene Toileting steps completed by helper: Adjust clothing prior to toileting, Adjust clothing after toileting Toileting Assistive Devices: Grab bar or rail  Toileting assist Assist level: Touching or steadying assistance (Pt.75%)   Transfers Chair/bed transfer   Chair/bed transfer method: Ambulatory Chair/bed transfer assist level: Touching or steadying assistance (Pt > 75%) Chair/bed transfer assistive device: Armrests,  Patent attorneyWalker     Locomotion Ambulation     Max distance: 100 ft Assist level: Supervision or verbal cues   Wheelchair   Type: Manual Max wheelchair distance: 80 ft Assist Level: Supervision or verbal cues  Cognition Comprehension Comprehension assist level: Follows basic conversation/direction with no assist  Expression Expression assist level: Expresses basic 75 - 89% of the time/requires cueing 10 - 24% of the time. Needs helper to occlude trach/needs to repeat words.  Social Interaction Social Interaction assist level: Interacts appropriately 90% of the time - Needs monitoring or encouragement for participation or interaction.  Problem Solving Problem solving assist level: Solves basic 75 - 89% of the time/requires cueing 10 - 24% of the time  Memory Memory assist level: Recognizes or recalls 75 - 89% of the time/requires cueing 10 - 24% of the time    Medical Problem List and Plan: 1. Debility and encephalopathy secondary to small bowel obstruction and associated complications   Cont CIR  Cont NPO   No crushed meds through feeding tube  Enterocutaneous fistula, ?limiting tube feedings to 30 cc/h, however, Dr. Michaell CowingGross placed order for 16 hours/day @35ccs , cont TPN 2. DVT Prophylaxis/Anticoagulation: Pharmaceutical: Lovenox  3. Pain Management: IV morphine every 4 hours prn severe pain  4. Mood: team to provide ego support. LCSW to follow for evaluation and support.   Prozac started 1/18 5. Neuropsych: This patient is not fully capable of making decisions on his own behalf.  6. Skin/Wound Care: Continue air mattress overlay. Continue large Eakin's pouch to manage wound drainage. WOC for management. Encourage side lying to help manage MASD.  7. Malnutrition/Fluids/Electrolytes/Nutrition: continue TPN.   Albumin down to 1.6 on 2/8  Swallow eval completed with recs for free water protocol. 8. Abdominal abscess: Leucocytosis resolved. Completed rocephin 1/17 9. Anemia of chronic illness:    Hb 8.5 on 2/8  Cont to monitor 10. Electrolyte abnormality/Acute renal insufficiency: Managed by TNA   Mag stable 1.6 2/8 11. Encephalopathy: Improving.  12. Hyperglycemia  Overall controlled 2/9 13 Sleep disturbance  Has tried Haldol without benefit, ativan causes hallucinations  Continue Xanax elixer,  Improved 14. Mild hyponatremia: Improved  Na+ 137 on 2/8 15. Secondary HTN  Controlled 2/10  Cont to monitor  LOS (Days) 29 A FACE TO FACE EVALUATION WAS PERFORMED  Delaine Hernandez Karis Jubanil Binta Statzer 03/26/2016 9:25 AM

## 2016-03-26 NOTE — Progress Notes (Signed)
PHARMACY - TOTAL PARENTERAL NUTRITION CONSULT NOTE   Pharmacy Consult:  TPN Indication:  SBO and new EC fistula 02/13/16  Patient Measurements: Height: 6' 2"  (188 cm) Weight: 150 lb 12.8 oz (68.4 kg) IBW/kg (Calculated) : 82.2   Body mass index is 19.36 kg/m.  Assessment:  46 YOM with history of multiple procedures and extended hospitalization for recurrent partial GOO 2/2 pyloric stricture.  Patient was on TPN for several days, then discharged on 12/29/16 with J/G tube in place for TF. He was unable to tolerate TF and was transitioned back to TPN by [facility] on 01/05/17.  Patient was readmitted on 01/19/17 with partial SBO.  Patient failed MBS on 01/26/16 and did not tolerate bolus feeds through G-tube on 01/27/16. On 12/23, he underwent ex-lap with LoA, segmental SBR, and gastrojejunostomy. On 02/12/16, TF were found draining from midline incision with CT abd showing bowel perforation at [place] site and development of abscesses. Failed MBS on 03/25/16.   GI: EC fistula at SB jejunal anastomosis which is decreasing in size. Has had trouble advancing TFs so had been advancing very slowly per surgery. Surgery advanced TF to 35 ml/hr on 2/9 PM.  Failed MBS 03/25/16.  Albumin low at 1.6 (down from 1.9), prealbumin ok at 28.9. Drain output 900 mls/24 hrs, 520 overnight.  Endo: no hx DM. CBGs dc'd on 2/9. No SSI  Lytes:no labs today, Mag was 1.6 no 2/8 Renal: SCr and BUN stable. NS at 100 ml/hr daily for 10 hours Pulm: RA - nicotine patch Cards: orthostatic hypotension improved - VSS Hepatobil:AST and Alk Phos slightly elevated. ALT and Tbilli wnl. TG good at 131 (2/5) Neuro: Prozac, Xanax, lidocaine patch, PRN morphine, APAP TID Heme/Onc: S/p Iron dextran ID: S/p ceftriaxone for UTI. UCx negative. Afebrile, WBC wnl Best Practices: Lovenox, MC TPN Access: PICC TPN start date: [facility] > [facility] 01/20/16 > [facility] 02/26/15  Home TPN: 2156 kCal and 75gm protein per day (405g CHO = 1376 kCal, 50g lipid = 480  kCal)  Nutritional Goals: per RD recs on 2/7 Kcal: 2100-2300 Protein: 95-110 g  Current Nutrition:  Clinimix E 5/15 + lipids Jevity 1.2 at 96m/hr for 16 hrs (672 kcal and 31.5 g of protein) NPO except ice chips  Plan: Continue cyclic Clinimix E 50/62 Infuse 1560 ml over 12 hrs: 50 mL/hr x 1 hr, then 146 mL/hr x 10 hrs, then 50 mL/hr x 1 hr. Continue IV lipid emulsions 275mhr over 12 hrs Continue Jevity 1.2 at 352mr for 16 hrs per surgery TPN, IV lipid emulsions, and TFs provides ~ 110 g of protein and 2260 kCals per day meeting 100% of protein and kCal needs Change MVI to per tube Monitor TPN labs - next due Monday 2/12 F/U tolerance of TFs, surgery plans   MicEudelia Bunchharm.D. [id number] phone for 03/26/2016 from 7a-3:30p: [phone] after 3:30p, please call main pharmacy at: [phone] 03/26/2016 8:01 AM

## 2016-03-26 NOTE — Progress Notes (Signed)
Physical Therapy Session Note  Patient Details  Name: [name] MRN: [id number] Date of Birth: Jul 09, 1940  Today's Date: 03/26/2016 PT Individual Time: 1410-1437 PT Individual Time Calculation (min): 27 min    Skilled Therapeutic Interventions/Progress Updates:    Pt found in bed.  Wife expresses desire to have pt walk.  Pt denies pain but reports that he is tired.  Pt with condom cath which came off during transfer. Pt performed lower body dressing with PT at edge of bed and upper body dressing as well.  Requires max assist for donning/doffing shirt.   Nursing notified and nursing changed pt while therapist having pt stand with Rw.  Pt ambulated 100 ft with PT with RW with CGA.  Session worked on improving endurance and functional mobility.  Pt returned to bed with all rails up, needs met, and wife in room.  Therapy Documentation Precautions:  Precautions Precautions: Fall Precaution Comments: NPO, gastrostomy tube, LLQ JP drain, abdominal surgical incision with pouch Restrictions Weight Bearing Restrictions: No    See Function Navigator for Current Functional Status.   Therapy/Group: Individual Therapy  Lizanne Erker Hilario Quarry 03/26/2016, 3:31 PM

## 2016-03-27 ENCOUNTER — Inpatient Hospital Stay (HOSPITAL_COMMUNITY): Payer: Medicare Other | Admitting: Physical Therapy

## 2016-03-27 MED ORDER — CLINIMIX E/DEXTROSE (5/15) 5 % IV SOLN
INTRAVENOUS | Status: AC
  Administered 2016-03-27: 18:00:00 via INTRAVENOUS
  Filled 2016-03-27 (×2): qty 1560

## 2016-03-27 MED ORDER — FAT EMULSION 20 % IV EMUL
240.0000 mL | INTRAVENOUS | Status: AC
  Administered 2016-03-27: 240 mL via INTRAVENOUS
  Filled 2016-03-27: qty 250

## 2016-03-27 NOTE — Progress Notes (Signed)
PHARMACY - TOTAL PARENTERAL NUTRITION CONSULT NOTE   Pharmacy Consult:  TPN Indication:  SBO and new EC fistula 02/13/16  Patient Measurements: Height: 6' 2"  (188 cm) Weight: 154 lb 9.6 oz (70.1 kg) IBW/kg (Calculated) : 82.2   Body mass index is 19.85 kg/m.  Assessment:  15 YOM with history of multiple procedures and extended hospitalization for recurrent partial GOO 2/2 pyloric stricture.  Patient was on TPN for several days, then discharged on 12/29/16 with J/G tube in place for TF. He was unable to tolerate TF and was transitioned back to TPN by [facility] on 01/05/17.  Patient was readmitted on 01/19/17 with partial SBO.  Patient failed MBS on 01/26/16 and did not tolerate bolus feeds through G-tube on 01/27/16. On 12/23, he underwent ex-lap with LoA, segmental SBR, and gastrojejunostomy. On 02/12/16, TF were found draining from midline incision with CT abd showing bowel perforation at [place] site and development of abscesses. Failed MBS on 03/25/16.   GI: EC fistula at SB jejunal anastomosis which is decreasing in size. Has had trouble advancing TFs so had been advancing very slowly per surgery. Surgery advanced TF to 35 ml/hr on 2/9 PM.  Failed MBS 03/25/16.  Albumin low at 1.6 (down from 1.9), prealbumin ok at 28.9. Drain output 1020 mls/24 hrs, 350 overnight.  Endo: no hx DM. CBGs dc'd on 2/9. No SSI  Lytes:no labs today, Mag was 1.6 on 2/8 Renal: SCr and BUN stable. NS at 100 ml/hr daily for 10 hours Pulm: RA - nicotine patch Cards: orthostatic hypotension improved - VSS Hepatobil: Alk Phos slightly elevated.AST/ALT and Tbilli wnl. TG good at 131 (2/5) Neuro: Prozac, Xanax, lidocaine patch, PRN morphine, APAP TID Heme/Onc: S/p Iron dextran ID: S/p ceftriaxone for UTI. UCx negative. Afebrile, WBC wnl Best Practices: Lovenox, MC TPN Access: PICC TPN start date: [facility] > [facility] 01/20/16 > [facility] 02/26/15  Home TPN: 2156 kCal and 75gm protein per day (405g CHO = 1376 kCal, 50g lipid = 480  kCal)  Nutritional Goals: per RD recs on 2/7 Kcal: 2100-2300 Protein: 95-110 g  Current Nutrition:  Clinimix E 5/15 + lipids Jevity 1.2 at 8m/hr for 16 hrs (672 kcal and 31.5 g of protein) NPO except ice chips  Plan: Continue cyclic Clinimix E 55/18 Infuse 1560 ml over 12 hrs: 50 mL/hr x 1 hr, then 146 mL/hr x 10 hrs, then 50 mL/hr x 1 hr. Continue IV lipid emulsions 261mhr over 12 hrs Continue Jevity 1.2 at 3543mr for 16 hrs per surgery TPN, IV lipid emulsions, and TFs provides ~ 110 g of protein and 2260 kCals per day meeting 100% of protein and kCal needs MVI to per tube Monitor TPN labs - next due Monday 2/12 F/U tolerance of TFs, surgery plans   MicEudelia Bunchharm.D. [id number] phone for 03/27/2016 from 7a-3:30p: [phone] after 3:30p, please call main pharmacy at: [phone] 03/27/2016 7:54 AM

## 2016-03-27 NOTE — Plan of Care (Signed)
Problem: RH BLADDER ELIMINATION Goal: RH STG MANAGE BLADDER WITH ASSISTANCE STG Manage Bladder With min Assistance   Outcome: Not Progressing Condom cath incontinet

## 2016-03-27 NOTE — Progress Notes (Signed)
[place] PHYSICAL MEDICINE & REHABILITATION     PROGRESS NOTE  Subjective/Complaints: Pt seen laying in bed this AM.  He slept well overnight. Asks me to keep his door closed.   ROS: Denies vomiting, diarrhea, shortness of breath or chest pain.  Objective: Vital Signs: Blood pressure (!) 144/62, pulse 72, temperature 97.6 F (36.4 C), temperature source Oral, resp. rate 17, height 6\' 2"  (1.88 m), weight 70.1 kg (154 lb 9.6 oz), SpO2 97 %. No results found. No results for input(s): WBC, HGB, HCT, PLT in the last 72 hours. No results for input(s): NA, K, CL, GLUCOSE, BUN, CREATININE, CALCIUM in the last 72 hours.  Invalid input(s): CO CBG (last 3)   Recent Labs  03/24/16 2023 03/25/16 0041 03/25/16 0342  GLUCAP 119* 120* 117*    Wt Readings from Last 3 Encounters:  03/27/16 70.1 kg (154 lb 9.6 oz)  02/26/16 78.6 kg (173 lb 4.5 oz)  01/19/16 79.2 kg (174 lb 8 oz)    Physical Exam:  BP (!) 144/62 (BP Location: Left Arm)   Pulse 72   Temp 97.6 F (36.4 C) (Oral)   Resp 17   Ht 6\' 2"  (1.88 m)   Wt 70.1 kg (154 lb 9.6 oz)   SpO2 97%   BMI 19.85 kg/m  Constitutional: NAD. Vital signs reviewed. Frail.  HENT: Normocephalic and atraumatic.  Eyes: EOMI. No discharge.  Cardiovascular: RRR. No JVD. Respiratory: CTA bilaterally. Unlabored.  GI: He exhibits no distension. BS+. +Eakins pouch. GJ tube. No pain to palpation                                                              Musculoskeletal: He exhibits no edema. He exhibits no tenderness.  Neurological: He is alert.  Speech with dysarthria Motor: 4/5 throughout (unchanged) Skin: Skin is warm and dry. Abdominal wounds with dressing.   Assessment/Plan: 1. Functional deficits secondary to debility and encephalopathy which require 3+ hours per day of interdisciplinary therapy in a comprehensive inpatient rehab setting. Physiatrist is providing close team supervision and 24 hour management of active medical problems  listed below. Physiatrist and rehab team continue to assess barriers to discharge/monitor patient progress toward functional and medical goals.  Function:  Bathing Bathing position Bathing activity did not occur: Refused Position: Wheelchair/chair at sink  Bathing parts Body parts bathed by patient: Right arm, Left arm, Buttocks, Right upper leg, Left upper leg, Right lower leg, Left lower leg, Front perineal area, Chest Body parts bathed by helper: Buttocks, Front perineal area  Bathing assist Assist Level: Touching or steadying assistance(Pt > 75%), Assistive device Assistive Device Comment: long-handled sponge Set up : To obtain items  Upper Body Dressing/Undressing Upper body dressing Upper body dressing/undressing activity did not occur: Safety/medical concerns What is the patient wearing?: Pull over shirt/dress     Pull over shirt/dress - Perfomed by patient: Thread/unthread right sleeve, Thread/unthread left sleeve, Put head through opening Pull over shirt/dress - Perfomed by helper: Pull shirt over trunk        Upper body assist Assist Level: Touching or steadying assistance(Pt > 75%)   Set up : To obtain clothing/put away  Lower Body Dressing/Undressing Lower body dressing Lower body dressing/undressing activity did not occur: Safety/medical concerns What is the patient wearing?: Pants,  [employer], Shoes, Non-skid Buyer, retail- Performed by patient: Thread/unthread right pants leg, Thread/unthread left pants leg Pants- Performed by helper: Pull pants up/down, Thread/unthread right pants leg, Thread/unthread left pants leg Non-skid slipper socks- Performed by patient: Don/doff right sock Non-skid slipper socks- Performed by helper: Don/doff left sock, Don/doff right sock   Socks - Performed by helper: Don/doff right sock, Don/doff left sock   Shoes - Performed by helper: Don/doff right shoe, Don/doff left shoe, Fasten right, Fasten left       TED Hose -  Performed by helper: Don/doff right TED hose, Don/doff left TED hose  Lower body assist Assist for lower body dressing: 2 Designer, multimedia activity did not occur: No continent bowel/bladder event Toileting steps completed by patient: Performs perineal hygiene Toileting steps completed by helper: Adjust clothing prior to toileting, Adjust clothing after toileting Toileting Assistive Devices: Grab bar or rail  Toileting assist Assist level: Touching or steadying assistance (Pt.75%)   Transfers Chair/bed transfer   Chair/bed transfer method: Ambulatory Chair/bed transfer assist level: Touching or steadying assistance (Pt > 75%) Chair/bed transfer assistive device: Armrests, Patent attorney     Max distance:  (100 ft) Assist level: Touching or steadying assistance (Pt > 75%)   Wheelchair   Type: Manual Max wheelchair distance: 80 ft Assist Level: Supervision or verbal cues  Cognition Comprehension Comprehension assist level: Follows basic conversation/direction with no assist  Expression Expression assist level: Expresses basic 75 - 89% of the time/requires cueing 10 - 24% of the time. Needs helper to occlude trach/needs to repeat words.  Social Interaction Social Interaction assist level: Interacts appropriately 75 - 89% of the time - Needs redirection for appropriate language or to initiate interaction.  Problem Solving Problem solving assist level: Solves basic 75 - 89% of the time/requires cueing 10 - 24% of the time  Memory Memory assist level: Recognizes or recalls 75 - 89% of the time/requires cueing 10 - 24% of the time    Medical Problem List and Plan: 1. Debility and encephalopathy secondary to small bowel obstruction and associated complications   Cont CIR  Cont NPO   No crushed meds through feeding tube  Enterocutaneous fistula, ?limiting tube feedings to 30 cc/h, however, Dr. Michaell Cowing placed order for 16 hours/day @35ccs , cont  TPN 2. DVT Prophylaxis/Anticoagulation: Pharmaceutical: Lovenox  3. Pain Management: IV morphine every 4 hours prn severe pain  4. Mood: team to provide ego support. LCSW to follow for evaluation and support.   Prozac started 1/18 5. Neuropsych: This patient is not fully capable of making decisions on his own behalf.  6. Skin/Wound Care: Continue air mattress overlay. Continue large Eakin's pouch to manage wound drainage. WOC for management. Encourage side lying to help manage MASD.  7. Malnutrition/Fluids/Electrolytes/Nutrition: continue TPN.   Albumin down to 1.6 on 2/8  Swallow eval completed with recs for free water protocol. 8. Abdominal abscess: Leucocytosis resolved. Completed rocephin 1/17 9. Anemia of chronic illness:   Hb 8.5 on 2/8  Cont to monitor 10. Electrolyte abnormality/Acute renal insufficiency: Managed by TNA   Mag stable 1.6 2/8 11. Encephalopathy: Improving.  12. Hyperglycemia  Overall controlled 2/9 13 Sleep disturbance  Has tried Haldol without benefit, ativan causes hallucinations  Continue Xanax elixer,  Improved 14. Mild hyponatremia: Improved  Na+ 137 on 2/8 15. Secondary HTN  Slightly elevated 2/11  Cont to monitor  LOS (Days) 30 A  FACE TO FACE EVALUATION WAS PERFORMED  Ankit Karis Jubanil [name] 03/27/2016 9:36 AM

## 2016-03-27 NOTE — Progress Notes (Signed)
Physical Therapy Session Note  Patient Details  Name: [name] MRN: [id number] Date of Birth: 06/02/1940  Today's Date: 03/27/2016 PT Individual Time: 1100-1200 PT Individual Time Calculation (min): 60 min   Short Term Goals: Week 1:  PT Short Term Goal 1 (Week 1): Patient will perform bed mobility with min assist consistently  PT Short Term Goal 1 - Progress (Week 1): Met PT Short Term Goal 2 (Week 1): Patient will ambulate 50ft with RW and min assist.  PT Short Term Goal 2 - Progress (Week 1): Progressing toward goal PT Short Term Goal 3 (Week 1): Patient will perform WC mobility x150ft with supervison assist.  PT Short Term Goal 3 - Progress (Week 1): Progressing toward goal PT Short Term Goal 4 (Week 1): Patient will perform sit<>stand and stand pivot transfers with min assist  PT Short Term Goal 4 - Progress (Week 1): Met PT Short Term Goal 5 (Week 1): Patient will maintain standing balance with min assist x 3 minutes  PT Short Term Goal 5 - Progress (Week 1): Progressing toward goal  Skilled Therapeutic Interventions/Progress Updates:  Pt was seen bedside in the am. Pt rolled R/L with side rails and min A to assist with hygiene. Pt dependent for hygiene and changing brief. Pt transferred supine to edge of bed with mod A and verbal cues. Pt transferred edge of bed to w/c with rolling walker and min A with verbal cues. Pt transported to gym. Pt ambulated 150 and 125 feet with rolling walker and c/s to min guard with verbal cues. Pt performed multiple sit to stand transfers with rolling walker and min guard to min A. Pt c/o dizziness following ambulation. BP as below. Pt returned to room following treatment. Pt transferred w/c to edge of bed with rolling walker and min A. Pt transferred edge of bed to supine with mod A and verbal cues. Pt left sitting up in bed with call bell within reach.   Therapy Documentation Precautions:  Precautions Precautions: Fall Precaution Comments:  NPO, gastrostomy tube, LLQ JP drain, abdominal surgical incision with pouch Restrictions Weight Bearing Restrictions: No General:   Vitals: BP: 129/57 HR: 62 O2 sat: 97%  Pain: No c/o pain.  See Function Navigator for Current Functional Status.   Therapy/Group: Individual Therapy  ,  G 03/27/2016, 12:14 PM  

## 2016-03-28 ENCOUNTER — Inpatient Hospital Stay (HOSPITAL_COMMUNITY): Payer: Medicare Other | Admitting: Occupational Therapy

## 2016-03-28 ENCOUNTER — Inpatient Hospital Stay (HOSPITAL_COMMUNITY): Payer: Medicare Other | Admitting: Physical Therapy

## 2016-03-28 ENCOUNTER — Inpatient Hospital Stay (HOSPITAL_COMMUNITY): Payer: Medicare Other | Admitting: Speech Pathology

## 2016-03-28 LAB — DIFFERENTIAL
BASOS PCT: 1 %
Basophils Absolute: 0 10*3/uL (ref 0.0–0.1)
Eosinophils Absolute: 0.1 10*3/uL (ref 0.0–0.7)
Eosinophils Relative: 2 %
LYMPHS PCT: 50 %
Lymphs Abs: 3.7 10*3/uL (ref 0.7–4.0)
MONO ABS: 0.8 10*3/uL (ref 0.1–1.0)
MONOS PCT: 11 %
NEUTROS ABS: 2.6 10*3/uL (ref 1.7–7.7)
Neutrophils Relative %: 36 %

## 2016-03-28 LAB — CBC
HEMATOCRIT: 33 % — AB (ref 39.0–52.0)
HEMOGLOBIN: 10.7 g/dL — AB (ref 13.0–17.0)
MCH: 29.7 pg (ref 26.0–34.0)
MCHC: 32.4 g/dL (ref 30.0–36.0)
MCV: 91.7 fL (ref 78.0–100.0)
Platelets: 321 10*3/uL (ref 150–400)
RBC: 3.6 MIL/uL — ABNORMAL LOW (ref 4.22–5.81)
RDW: 15.8 % — ABNORMAL HIGH (ref 11.5–15.5)
WBC: 6.8 10*3/uL (ref 4.0–10.5)

## 2016-03-28 LAB — COMPREHENSIVE METABOLIC PANEL
ALBUMIN: 2 g/dL — AB (ref 3.5–5.0)
ALK PHOS: 209 U/L — AB (ref 38–126)
ALT: 42 U/L (ref 17–63)
ANION GAP: 5 (ref 5–15)
AST: 40 U/L (ref 15–41)
BUN: 22 mg/dL — ABNORMAL HIGH (ref 6–20)
CALCIUM: 8.5 mg/dL — AB (ref 8.9–10.3)
CO2: 25 mmol/L (ref 22–32)
Chloride: 104 mmol/L (ref 101–111)
Creatinine, Ser: 0.69 mg/dL (ref 0.61–1.24)
GFR calc Af Amer: >60 mL/min (ref >60)
GFR calc non Af Amer: >60 mL/min (ref >60)
Glucose, Bld: 101 mg/dL — ABNORMAL HIGH (ref 65–99)
Potassium: 4.1 mmol/L (ref 3.5–5.1)
SODIUM: 134 mmol/L — AB (ref 135–145)
Total Bilirubin: 0.2 mg/dL — ABNORMAL LOW (ref 0.3–1.2)
Total Protein: 6.5 g/dL (ref 6.5–8.1)

## 2016-03-28 LAB — PHOSPHORUS: Phosphorus: 4.1 mg/dL (ref 2.5–4.6)

## 2016-03-28 LAB — MAGNESIUM: Magnesium: 1.7 mg/dL (ref 1.7–2.4)

## 2016-03-28 LAB — TRIGLYCERIDES: TRIGLYCERIDES: 135 mg/dL (ref <150)

## 2016-03-28 LAB — PREALBUMIN: Prealbumin: 31.1 mg/dL (ref 18–38)

## 2016-03-28 MED ORDER — ACETAMINOPHEN 160 MG/5ML PO SOLN: 650.0000 mg | Freq: Four times a day (QID) | ORAL | Status: DC | PRN

## 2016-03-28 MED ORDER — ERYTHROMYCIN ETHYLSUCCINATE 200 MG/5ML PO SUSR: 400.0000 mg | Freq: Four times a day (QID) | ORAL | 0 refills | Status: DC

## 2016-03-28 MED ORDER — ALPRAZOLAM 1 MG/ML PO CONC: 0.5000 mg | Freq: Every evening | ORAL | 0 refills | Status: DC | PRN

## 2016-03-28 MED ORDER — JEVITY 1.2 CAL PO LIQD
1000.0000 mL | ORAL | Status: DC
  Administered 2016-03-29: 1000 mL
  Filled 2016-03-28 (×2): qty 1000
  Filled 2016-03-28: qty 237
  Filled 2016-03-28: qty 1000

## 2016-03-28 MED ORDER — CHLORHEXIDINE GLUCONATE 0.12 % MT SOLN: 15.0000 mL | Freq: Two times a day (BID) | OROMUCOSAL | 0 refills | Status: DC

## 2016-03-28 MED ORDER — FAT EMULSION 20 % IV EMUL
240.0000 mL | INTRAVENOUS | Status: AC
  Administered 2016-03-28: 240 mL via INTRAVENOUS
  Filled 2016-03-28: qty 250

## 2016-03-28 MED ORDER — CLINIMIX E/DEXTROSE (5/15) 5 % IV SOLN
INTRAVENOUS | Status: AC
  Administered 2016-03-28: 18:00:00 via INTRAVENOUS
  Filled 2016-03-28 (×2): qty 1560

## 2016-03-28 MED ORDER — FLUOXETINE HCL 20 MG/5ML PO SOLN: 20.0000 mg | Freq: Every day | ORAL | 0 refills | Status: DC

## 2016-03-28 MED ORDER — DIPHENHYDRAMINE HCL 12.5 MG/5ML PO ELIX: 12.5000 mg | ORAL_SOLUTION | Freq: Three times a day (TID) | ORAL | 0 refills | Status: DC | PRN

## 2016-03-28 MED ORDER — NONFORMULARY OR COMPOUNDED ITEM: 0.5000 mg | Freq: Every evening | 0 refills | Status: DC | PRN

## 2016-03-28 MED ORDER — MUSCLE RUB 10-15 % EX CREA: 1.0000 "application " | TOPICAL_CREAM | Freq: Three times a day (TID) | CUTANEOUS | 0 refills | Status: DC

## 2016-03-28 MED ORDER — MAGIC MOUTHWASH: 15.0000 mL | Freq: Four times a day (QID) | ORAL | 0 refills | Status: DC | PRN

## 2016-03-28 MED ORDER — JEVITY 1.2 CAL PO LIQD: ORAL | 0 refills | Status: DC

## 2016-03-28 MED ORDER — LIDOCAINE 5 % EX PTCH: MEDICATED_PATCH | CUTANEOUS | 0 refills | Status: DC

## 2016-03-28 MED ORDER — OMEPRAZOLE 2 MG/ML ORAL SUSPENSION: 40.0000 mg | Freq: Every day | ORAL | 0 refills | Status: DC

## 2016-03-28 NOTE — Progress Notes (Signed)
Social Work Patient ID: Ardyth Galalph T Dikes, male   DOB: [birth year], 76 y.o.   MRN: [id number]  Spoke with wife via telephone to ask if changed education regarding tube feedings. Wife reports: " [name] is not feeling well and Plan to come in tomorrow at 8:00 Am." Have re-scheduled tube feeding education at 8:00 and then have therapies after for them to see, to make sure comfortable with pt's care prior to discharge on Wed.  Pam-PA to meet with wife when arrives to [facility] In case liquid medicines take longer to fill. Will work toward discharge Wed.

## 2016-03-28 NOTE — Progress Notes (Signed)
Per order by Karie SodaSteven Gross, MD, G-port clamped and flushed with air. Patient tolerated procedure well with no adverse effects. G-tube put out approximately 30 mL of gastric residual. G-port placed back to gravity bag.

## 2016-03-28 NOTE — Progress Notes (Addendum)
PHARMACY - TOTAL PARENTERAL NUTRITION CONSULT NOTE   Pharmacy Consult:  TPN  Indication:  SBO and new EC fistula 02/13/16  Patient Measurements: Height: 6\' 2"  (188 cm) Weight: 158 lb 11.7 oz (72 kg) IBW/kg (Calculated) : 82.2 Body mass index is 20.38 kg/m.  Assessment:  [age] YOM with history of multiple procedures and extended hospitalization for recurrent partial GOO 2/2 pyloric stricture.  Patient was on TPN for several days, then discharged on 12/29/16 with J/G tube in place for TF. He was unable to tolerate TF and [facility].  Patient was readmitted on 01/19/17 with partial SBO.  Patient failed MBS on 01/26/16 and did not tolerate bolus feeds through G-tube on 01/27/16. On 12/23, he underwent ex-lap with LoA, segmental SBR, and gastrojejunostomy. On 02/12/16, TF were found draining from midline incision with CT abd showing bowel perforation at GJ site and development of abscesses. Failed MBS on 03/25/16.   GI: EC fistula at SB jejunal anastomosis which is decreasing in size. Has had trouble advancing TFs so had been advancing very slowly per surgery. Failed MBS 03/25/16.  Albumin 2, prealbumin 31. Drain output -1.1 L / 24h. On Erythromycin Endo: no hx DM, am glucose wnl, no SSI  Lytes: Mg 1.7, Coca 10.1, other lytes wnl Renal: SCr and BUN stable. NS at 100 ml/hr daily for 10 hours Pulm: RA Cards: orthostatic hypotension improved - VSS Hepatobil: LFTs wnl, Tbili 0.2, TG 135 Neuro: Prozac, lidocaine patch, PRN morphine, prn xanax, APAP TID  Nicoderm Heme/Onc: S/p Iron dextran ID: S/p ceftriaxone for UTI. UCx negative. Afebrile, WBC wnl Best Practices: Lovenox, MC TPN Access: PICC TPN start date: P[facility] > [facility] 01/20/16 > [facility] 02/26/15  Home TPN: 2156 kCal and 75gm protein per day (405g CHO = 1376 kCal, 50g lipid = 480 kCal)  Nutritional Goals: per RD recs on 2/7 Kcal: 2100-2300 Protein: 95-110 g  Current Nutrition:  Clinimix E 5/15 + lipids Jevity 1.2  at 8735ml/hr for 16 hrs (672 kcal and 31.5 g of protein) NPO except ice chips  Plan: -Continue cyclic Clinimix E 5/15. Infuse 1560 ml over 12 hrs: 50 mL/hr x 1 hr, then 146 mL/hr x 10 hrs, then 50 mL/hr x 1 hr. -Continue IV lipid emulsions 6320ml/hr over 12 hrs -Continue Jevity 1.2 at 1935ml/hr for 16 hrs per surgery -TPN, IV lipid emulsions, and TFs provides ~ 110 g of protein and 2260 kCals per day meeting 100% of protein and kCal needs -MVI to per tube -Monitor TPN labs -F/U tolerance of TFs, surgery plans, if pt continues to tolerate TF, consider weaning TPN    Agapito GamesAlison Judith Campillo, PharmD, BCPS Clinical Pharmacist 03/28/2016 7:53 AM

## 2016-03-28 NOTE — Progress Notes (Signed)
   Occupational Therapy Session Note  Patient Details  Name: [name] MRN: [id number] Date of Birth: [birth year]  Today's Date: 03/28/2016 OT Individual Time: 0900-1000 OT Individual Time Calculation (min): 60 min    Short Term Goals: Week 4:  OT Short Term Goal 1 (Week 4): LTG=STG 2/2 estimated dc date  Skilled Therapeutic Interventions/Progress Updates:     Pt greeted in bed reporting incontinence of bowel. Pt rolled with Min A for clean-up, then transferred to EOB and ambulated to wc at the sink with Min guard. Discussed toileting before bed to decrease incontinent episodes at night.  Addressed activity tolerance and standing endurance while pt washed bottom and peri-area thoroughly. 90% supervision with intermittent min guard A to correct posterior lean during standing ADL. Worked on task initiation and sequencing during self-care. Pt continued to require mod cues recall modified dressing techniques from previous OT sessions- pt able to don shirt with set-up and Min A for LB dressing. Pt then ambulated to the recliner with supervision and min guard A for balance when turning to sit onto chair. Pt left with needs met and call bell in reach.    Therapy Documentation Precautions:  Precautions Precautions: Fall Precaution Comments: NPO, gastrostomy tube, LLQ JP drain, abdominal surgical incision with pouch Restrictions Weight Bearing Restrictions: No Pain: Denies pain See Function Navigator for Current Functional Status.   Therapy/Group: Individual Therapy  Valma Cava 03/28/2016, 12:52 PM

## 2016-03-28 NOTE — Plan of Care (Signed)
Problem: RH BLADDER ELIMINATION Goal: RH STG MANAGE BLADDER WITH ASSISTANCE STG Manage Bladder With min Assistance   Outcome: Not Progressing Patient uses  Condom cath

## 2016-03-28 NOTE — Progress Notes (Signed)
Occupational Therapy Session Note  Patient Details  Name: Danny GalRalph T Bureau MRN: [id number] Date of Birth: 1941-01-06  Today's Date: 03/28/2016 OT Individual Time: 1300-1326 OT Individual Time Calculation (min): 26 min    Short Term Goals: Week 4:  OT Short Term Goal 1 (Week 4): LTG=STG 2/2 estimated dc date  Skilled Therapeutic Interventions/Progress Updates:    Upon entering the room, pt supine in bed sleeping but easily awakened for OT intervention. Pt performed supine >sit with min A to EOB. Pt unable to verbalize what he had participated in during prior therapies. Memory book utilized and pt continues to report that he doesn't remember participating in "any of those activities." Pt crossing B ankles to opposite knee and able to don B shoes on foot and able to tie L shoe but needing assistance tying R shoe. Pt declined toileting this session and OT encouraged secondary to pts incontinence. Pt continues to decline. Pt performs stand pivot transfer with RW and min A from bed >wheelchair. Pt remained in wheelchair at end of session with call bell and all needed items within reach.   Therapy Documentation Precautions:  Precautions Precautions: Fall Precaution Comments: NPO, gastrostomy tube, LLQ JP drain, abdominal surgical incision with pouch Restrictions Weight Bearing Restrictions: No  See Function Navigator for Current Functional Status.   Therapy/Group: Individual Therapy  Alen BleacherBradsher, Cleland Simkins P 03/28/2016, 1:30 PM

## 2016-03-28 NOTE — Progress Notes (Signed)
Physical Therapy Session Note  Patient Details  Name: [name] MRN: [id number] Date of Birth: 04/19/1940  Today's Date: 03/28/2016 PT Individual Time: 1400-1500 PT Individual Time Calculation (min): 60 min   Short Term Goals: Week 5:  PT Short Term Goal 1 (Week 5): = LTG due to anticipated LOS  Skilled Therapeutic Interventions/Progress Updates:  Pt was sitting up in W/C upon arrival to room. Pt showed +4 edema on bilateral forearms from W/C armrests. Nursing was notified. Throughout therapy, pt demonstrated sit-to-stand transfers supervision with one episode requiring min A to prevent posterior LOB. Pt ambulated 220 ft to rehab gym supervision with RW . Pt performed the TUG outcome measure from chair with arm rest and using Rw. Pt scored 26 sec, which indicates an increased risk for falling. Pt participated in several walking courses roughly 20 ft long. The first course required the pt to walk around 5 stationary cones before walking over foam floor mat to simulate uneven surface and reversing the course to return to chair. The second course included walking around 5 stationary cones and stepping over foam wedges before reversing the course to return to the chair. Pt demonstrated one arm supported standing tolerance with RW for 2 min and 3 min 30 sec while playing connect 4. Pt was returned to room and left sitting up in W/C with pillows underneath bilateral forearms and all needs within reach.       Therapy Documentation Precautions:  Precautions Precautions: Fall Precaution Comments: NPO, gastrostomy tube, LLQ JP drain, abdominal surgical incision with pouch Restrictions Weight Bearing Restrictions: No Pain: Pain Assessment Pain Assessment: No/denies pain Balance: Balance Balance Assessed: Yes Standardized Balance Assessment Standardized Balance Assessment: Timed Up and Go Test Timed Up and Go Test TUG: Normal TUG Normal TUG (seconds): 26  See Function Navigator for  Current Functional Status.   Therapy/Group: Individual Therapy  [name] 03/28/2016, 3:12 PM

## 2016-03-28 NOTE — Progress Notes (Signed)
Speech Language Pathology Daily Session Note  Patient Details  Name: [name] MRN: [id number] Date of Birth: 03/02/1940  Today's Date: 03/28/2016 SLP Individual Time: 1030-1130 SLP Individual Time Calculation (min): 60 min  Short Term Goals: Week 4: SLP Short Term Goal 1 (Week 4): Pt will utilize speech intelligibility strategies at the sentence level with Min A verbal cues to achieve  ~90% intelligibility.  SLP Short Term Goal 2 (Week 4): Pt will complete semi-complex tasks with Min A verbal cues. SLP Short Term Goal 3 (Week 4): Pt will sustain attention to functional tasks for ~ 30 minutes with supervision cues.  SLP Short Term Goal 4 (Week 4): Pt will utilize external memory aids to recall new, daily information with Min A verbal cues.  SLP Short Term Goal 5 (Week 4): Pt will use memory aide to recall informaiton from previous therapy sessions with Min A verbal cues.  SLP Short Term Goal 6 (Week 4): Pt will consume therapeutic trials of ice chips with minimal overt s/s of aspiration over 2 consecutive sessions to demonstrate readiness for repeat MBS.  SLP Short Term Goal 6 - Progress (Week 4): Discontinued (comment) (Replace with Pt will improve MEP by 10% to improve cough strength, decrease frequency of suctioning. )  Skilled Therapeutic Interventions: Skilled treatment session focused on dysphagia and cognition goals. SLP facilitated session by providing supervision cues for use of memory book. Pt had written in memory book items of interest from weekend. Pt required Mod A verbal cues to navigate calendar for correct location of information. Pt required Mod A verbal cues for completion of semi-complex problem solving tasks. Pt required Min A verbal cues for use of expiratory muscle trainer set at 45 cmH2O with set of 5 reps with perceived exertion of 7. Pt was handed off to nursing for tolieting. Continue current plan of care.      Function:  Eating Eating   Modified Consistency  Diet: Yes (Trials of water wth ST only) Eating Assist Level: Swallowing techniques: self managed;More than reasonable amount of time;Set up assist for           Cognition Comprehension Comprehension assist level: Follows basic conversation/direction with no assist  Expression   Expression assist level: Expresses basic 75 - 89% of the time/requires cueing 10 - 24% of the time. Needs helper to occlude trach/needs to repeat words.  Social Interaction Social Interaction assist level: Interacts appropriately 75 - 89% of the time - Needs redirection for appropriate language or to initiate interaction.  Problem Solving Problem solving assist level: Solves basic 75 - 89% of the time/requires cueing 10 - 24% of the time  Memory Memory assist level: Recognizes or recalls 75 - 89% of the time/requires cueing 10 - 24% of the time    Pain    Therapy/Group: Individual Therapy  Bravlio Luca B. Dreama Saaverton, M.S., CCC-SLP Speech-Language Pathologist  Flynn Gwyn 03/28/2016, 11:20 AM

## 2016-03-28 NOTE — Progress Notes (Signed)
Social Work Patient ID: [name], male   DOB: March 26, 1940, 76 y.o.   MRN: [id number]  [facility] who reports the medical director has given pt until Wed for discharge. Family has decided to take pt home and did do some training Friday. Will be here today to do tube feeding education and therapy education. Have ordered equipment to be delivered tomorrow in anticipation Of discharge Wed. Will work toward discharge Wed. Family aware of insurance covering until Wed.

## 2016-03-28 NOTE — Progress Notes (Addendum)
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 02/06/2016   Jejunal anastomotic leak   Anxiety state   Encephalopathy acute   Tobacco abuse   On total parenteral nutrition (TPN)   Skin ulcer of abdominal wall, limited to breakdown of skin (HCC)   Anemia of chronic disease   Hyperglycemia   FTT (failure to thrive) in adult   Leukocytosis   Sleep disturbance   Orthostasis   Anxiety and depression   Hyponatremia   Gastrojejunostomy tube 02/06/2016   Hypoalbuminemia due to protein-calorie malnutrition (HCC)   Gastric dysmotility   Gastrojejunostomy tube dislodgement ([place])   Secondary hypertension      SURGERY   02/06/2016  POST-OPERATIVE DIAGNOSIS:  SMALL BOWEL OBSTRUCTION  PROCEDURE:   EXPLORATORY LAPAROTOMY & LYSIS OF ADHESIONS SMALL BOWEL RESECTION GASTROJEJUNOSTOMY Kinder tube placement  SURGEON:   Autumn Messing III, MD - Primary.  Alphonsa Overall, MD - Assisting  SURGERY 12/03/2015  POST-OPERATIVE DIAGNOSIS:   Peritonitis Jejunal disruption Gastric leak  PROCEDURE:   LAPAROSCOPY DIAGNOSTIC OMENTOPEXY of jejunal disruption [place] OUT x 21L with drains placement  SURGEON: Michael Boston, MD   Post-Op 12/01/2015  POST-OPERATIVE DIAGNOSIS:   RECURRENT GASTRIC OUTLET OBSTRUCTION DUE to significant edema at gastroduodenal anastomosis. GASTRITIS Failure to thrive with malnutrition  PROCEDURE:   LAPAROSCOPIC PLACEMENT OF FEEDING JEJUNOSTOMY AND GASTROSTOMY TUBEs ESOPHAGOGASTRODUODENOSCOPY (EGD) BALLOON DILATION OF DUODENAL STRICTURE Gastric biopsies  Closure of gastrotomy  SURGEON: Michael Boston, MD  Prior surgery 09/25/2015  POST-OPERATIVE DIAGNOSIS:  Partial gastric outlet obstruction due to chronic pyloric stricture  PROCEDURE:  XI ROBOTIC distal gastrectomy BILROTH I ANASTOMOSIS  ANTERIOR AND POSTERIOR VAGOTOMY DOR (Anterior 426 degree) FUNDIPLICATION OMENTOPEXY UPPER ENDOSCOPY  SURGEON: Michael Boston, MD    Assessment  Recovering  Plan:  -EC fistula from SB jejunal anastomosis - closing w smaller pouch & low output a hopeful sign  -try clamping gastrostomy tube as output is less and EC fistula nearly closed with better nutrition.  Check residuals every 6 hours.  If he feels increasing pain or bloating, return to gravity bag or place to low intermittent wall suction.  Otherwise, keep clamped at all times.  -TFs in jejunal end of GJ tube.  Tip 30cm distal to jejunal leak but some reflux to it.  Inc rate again to 1m/hr.  If tolerates gastric port tubeclamping trials with no change or resolution of enterocutaneous fistula output, may try to advance to goal  -TNA for severe malnutrition.  Albumin finally = 2 & prealbumin in the 30s now which is a hopeful sign.  I suspect his recovery will be slow until Alb>2.5.  -Retry oral intake.  Ultimately up to [place] but allow him to have ice chips and sips of clears, 1205mvolume restricted.  It will just strain out his G-tube portion of the gastrojejunostomy.  If suddenly his enterocutaneous fistula output increases, then go back to nothing by mouth.  If it does not change the output, can advance to a dysphagia one diet.  -VTE prophylaxis- SCDs, etc  -PPI - restarted omeprazole  -mobilize as tolerated to help recovery.  Making progress w PT/OT/SLP. I strongly believe rehab CRITICAL for recovery and would maximize inpatient LOS as much as possible  My overall plan  would be:  Continue to control drainage of the low output jejunal fistula.  Advance tube feeds to goal.  Gastric tube clamping trials.  Then wean TNA.  Then switch to medicines by mouth or through  G-tube instead of liquids only in the jejunostomy tube or per IV  Advance oral diet.  Pull out his gastrostomy tube when he is tolerating everything by mouth more than three weeks.  I suspect this will take several months.  His severe malnutrition will need to be corrected.  That is why IV TNA nutrition & eventual eneteral tube & ultimately PO nutrition and aggressive physical therapy will be absolutely essential.   -Disposition.  I suspect that he is getting be too much to handle to go directly home from rehabilitation.  Probably needs some type of skilled nursing facility as long as they can handle the IV nutrition, gastrojejunostomy tube, and can provide consistent physical therapy to for him to recover.  Hopefully can get to the point where he will not need all these components and will be simpler to manage at home and transition to outpatient rehabilitation.  Again I think these step will take several months; but, He has made definite progress in the past few weeks, so I am hopeful.  I updated the patient's status to the patient.  Long discussion over an hour.  Also the rehabilitation nursing team.  Recommendations were made.  Questions were answered.  He expressed understanding & appreciation.   [name], M.D., F.A.C.S. Gastrointestinal and Minimally Invasive Surgery [facility] [address] [place], [place] [address] [phone] Main / Paging   03/28/2016  CARE TEAM:  PCP: Irven Shelling, MD  Outpatient Care Team: Patient Care Team: Lavone Orn, MD as PCP - General (Internal Medicine) Michael Boston, MD as Consulting Physician (General Surgery) Arta Silence, MD as Consulting Physician (Gastroenterology) Chesley Mires, MD as Consulting Physician (Pulmonary Disease)  Inpatient Treatment Team: Treatment Team: Attending Provider: Jamse Arn, MD; Social Worker: Elease Hashimoto, LCSW; Consulting Physician: Nolon Nations, MD; Physical  Therapist: Murray Hodgkins Ward, PT; Consulting Physician: Michael Boston, MD; Physical Therapist: [name], Student-PT; Registered Nurse: Joline Maxcy, RN; Technician: Olegario Messier, NT; Registered Nurse: Darnelle Spangle, RN; Registered Nurse: Margarito Liner, RN; Registered Nurse: Jama Flavors, RN; Registered Nurse: Renato Gails Royal, RN; Technician: Benjaman Pott Adger, NT; Registered Nurse: Suella Grove, RN; Technician: Asencion Islam, NT; Physical Therapist: Luberta Mutter, PT  Subjective:  Patient awake and more alert.  Watched the [facility] basketball games.  Trying ice chips.  Working with rehabilitation more.  He denies any abdominal pain.  Does not think the EC fistula is draining much.  Having flatus and bowel movements  Objective:  Vital signs:  Vitals:   03/26/16 1349 03/27/16 0509 03/27/16 1404 03/28/16 0438  BP: (!) 141/50 (!) 144/62 (!) 138/54 140/60  Pulse: 68 72 65 72  Resp: 17 17 16    Temp: 98.4 F (36.9 C) 97.6 F (36.4 C) 97.6 F (36.4 C) 98.5 F (36.9 C)  TempSrc: Oral Oral Oral Oral  SpO2: 98% 97% 98% 98%  Weight:  70.1 kg (154 lb 9.6 oz)  72 kg (158 lb 11.7 oz)  Height:        Last BM Date: 03/27/16  Intake/Output   Yesterday:  02/11 0701 - 02/12 0700 In: 2035 [DH/RC:1638] Out: 1875 [Urine:775; Drains:1100] This shift:  No intake/output data recorded.  Bowel function:  Flatus: YES  BM:  YES  Drain: Scant succus in [place] bag.     Physical Exam:  General: Pt awake/alert/oriented x4 in No acute distress.  Tired but not toxic.  More color on the face.  More interactive Eyes: PERRL, normal EOM.  Sclera clear.  No icterus Neuro: CN II-XII intact w/o focal sensory/motor deficits. Lymph: No head/neck/groin lymphadenopathy Psych:  No delerium/psychosis/paranoia.  Not agitated.  No evidence of dementia. HENT: Normocephalic, Mucus membranes moist.  No thrush.  I removed his anterior neck dressing.  Tracheostomy site closed Neck: Supple, No  tracheal deviation Chest: No chest wall pain w good excursion CV:  Pulses intact.  Regular rhythm MS: Normal AROM mjr joints.  No obvious deformity  Abdomen: Soft.  Nondistended.  Nontender.  No evidence of peritonitis.  No incarcerated hernias.  Gastrostomy tube site heal down and closed.  No necrotic tissue.    Midline incision closing down.  Much smaller superior wound controlled with Eakin's pouch consistent with closing incision and persistent low volume EC fistula.  Much improved  Ext:  SCDs BLE.  No mjr edema.  No cyanosis Skin: No petechiae / purpura  Results:   Labs: Results for orders placed or performed during the hospital encounter of 02/26/16 (from the past 48 hour(s))  Comprehensive metabolic panel     Status: Abnormal   Collection Time: 03/28/16  4:58 AM  Result Value Ref Range   Sodium 134 (L) 135 - 145 mmol/L   Potassium 4.1 3.5 - 5.1 mmol/L   Chloride 104 101 - 111 mmol/L   CO2 25 22 - 32 mmol/L   Glucose, Bld 101 (H) 65 - 99 mg/dL   BUN 22 (H) 6 - 20 mg/dL   Creatinine, Ser 0.69 0.61 - 1.24 mg/dL   Calcium 8.5 (L) 8.9 - 10.3 mg/dL   Total Protein 6.5 6.5 - 8.1 g/dL   Albumin 2.0 (L) 3.5 - 5.0 g/dL   AST 40 15 - 41 U/L   ALT 42 17 - 63 U/L   Alkaline Phosphatase 209 (H) 38 - 126 U/L   Total Bilirubin 0.2 (L) 0.3 - 1.2 mg/dL   GFR calc non Af Amer >60 >60 mL/min   GFR calc Af Amer >60 >60 mL/min    Comment: (NOTE) The eGFR has been calculated using the CKD EPI equation. This calculation has not been validated in all clinical situations. eGFR's persistently <60 mL/min signify possible Chronic Kidney Disease.    Anion gap 5 5 - 15  Differential     Status: None   Collection Time: 03/28/16  4:58 AM  Result Value Ref Range   Neutrophils Relative % 36 %   Neutro Abs 2.6 1.7 - 7.7 K/uL   Lymphocytes Relative 50 %   Lymphs Abs 3.7 0.7 - 4.0 K/uL   Monocytes Relative 11 %   Monocytes Absolute 0.8 0.1 - 1.0 K/uL   Eosinophils Relative 2 %   Eosinophils  Absolute 0.1 0.0 - 0.7 K/uL   Basophils Relative 1 %   Basophils Absolute 0.0 0.0 - 0.1 K/uL  Magnesium     Status: None   Collection Time: 03/28/16  4:58 AM  Result Value Ref Range   Magnesium 1.7 1.7 - 2.4 mg/dL  Phosphorus     Status: None   Collection Time: 03/28/16  4:58 AM  Result Value Ref Range   Phosphorus 4.1 2.5 - 4.6 mg/dL  Prealbumin     Status: None   Collection Time: 03/28/16  4:58 AM  Result Value Ref Range   Prealbumin 31.1 18 - 38 mg/dL  Triglycerides     Status: None   Collection Time: 03/28/16  4:58 AM  Result Value Ref Range   Triglycerides 135 <150 mg/dL  CBC     Status: Abnormal   Collection Time: 03/28/16  4:58 AM  Result Value Ref Range   WBC 6.8 4.0 - 10.5 K/uL   RBC 3.60 (L) 4.22 - 5.81 MIL/uL   Hemoglobin 10.7 (L) 13.0 - 17.0 g/dL   HCT 33.0 (L) 39.0 - 52.0 %   MCV 91.7 78.0 - 100.0 fL   MCH 29.7 26.0 - 34.0 pg   MCHC 32.4 30.0 - 36.0 g/dL   RDW 15.8 (H) 11.5 - 15.5 %   Platelets 321 150 - 400 K/uL    Imaging / Studies: No results found.  Medications / Allergies: per chart  Antibiotics: Anti-infectives    Start     Dose/Rate Route Frequency Ordered Stop   03/25/16 1600  erythromycin ethylsuccinate (EES) 200 MG/5ML suspension 400 mg    Comments:  Give via G tube   400 mg Per Tube Every 6 hours 03/25/16 1447     03/16/16 2200  erythromycin ethylsuccinate (EES) 200 MG/5ML suspension 200 mg  Status:  Discontinued    Comments:  Give via G tube   200 mg Per Tube Every 8 hours 03/16/16 1746 03/25/16 1447   03/14/16 2000  erythromycin ethylsuccinate (EES) 200 MG/5ML suspension 200 mg  Status:  Discontinued    Comments:  Give via G tube   200 mg Per Tube Every 12 hours 03/14/16 0805 03/16/16 1746   03/08/16 1730  erythromycin ethylsuccinate (EES) 200 MG/5ML suspension 200 mg  Status:  Discontinued     200 mg Per Tube Every 8 hours 03/08/16 1634 03/14/16 0805   02/29/16 2000  cefTRIAXone (ROCEPHIN) 2 g in dextrose 5 % 50 mL IVPB     2 g 100  mL/hr over 30 Minutes Intravenous Every 24 hours 02/29/16 1804 03/02/16 2017   02/27/16 2000  cefTRIAXone (ROCEPHIN) 2 g in dextrose 5 % 50 mL IVPB  Status:  Discontinued     2 g 100 mL/hr over 30 Minutes Intravenous Every 24 hours 02/26/16 2137 02/29/16 1804        Note: Portions of this report may have been transcribed using voice recognition software. Every effort was made to ensure accuracy; however, inadvertent computerized transcription errors may be present.   Any transcriptional errors that result from this process are unintentional.     [name], M.D., F.A.C.S. Gastrointestinal and Minimally Invasive Surgery [facility] [address] [place], [place] [address] [phone] Main / Paging   03/28/2016

## 2016-03-28 NOTE — Progress Notes (Signed)
[place] PHYSICAL MEDICINE & REHABILITATION     PROGRESS NOTE  Subjective/Complaints: Pt seen laying in bed this AM.  He slept well overnight.  He tells me to close his door.   ROS: Denies vomiting, diarrhea, shortness of breath or chest pain.  Objective: Vital Signs: Blood pressure 140/60, pulse 72, temperature 98.5 F (36.9 C), temperature source Oral, resp. rate 16, height 6\' 2"  (1.88 m), weight 72 kg (158 lb 11.7 oz), SpO2 98 %. No results found.  Recent Labs  03/28/16 0458  WBC 6.8  HGB 10.7*  HCT 33.0*  PLT 321    Recent Labs  03/28/16 0458  NA 134*  K 4.1  CL 104  GLUCOSE 101*  BUN 22*  CREATININE 0.69  CALCIUM 8.5*   CBG (last 3)  No results for input(s): GLUCAP in the last 72 hours.  Wt Readings from Last 3 Encounters:  03/28/16 72 kg (158 lb 11.7 oz)  02/26/16 78.6 kg (173 lb 4.5 oz)  01/19/16 79.2 kg (174 lb 8 oz)    Physical Exam:  BP 140/60 (BP Location: Left Arm)   Pulse 72   Temp 98.5 F (36.9 C) (Oral)   Resp 16   Ht 6\' 2"  (1.88 m)   Wt 72 kg (158 lb 11.7 oz)   SpO2 98%   BMI 20.38 kg/m  Constitutional: NAD. Vital signs reviewed. Frail.  HENT: Normocephalic and atraumatic.  Eyes: EOMI. No discharge.  Cardiovascular: RRR. No JVD. Respiratory: CTA bilaterally. Unlabored.  GI: He exhibits no distension. BS+. +Eakins pouch. GJ tube. No pain to palpation                                                              Musculoskeletal: He exhibits no edema. He exhibits no tenderness.  Neurological: He is alert.  Speech with dysarthria Motor: 4+/5 throughout  Skin: Skin is warm and dry. Abdominal wounds with dressing.  Psych: Normal mood and affect.   Assessment/Plan: 1. Functional deficits secondary to debility and encephalopathy which require 3+ hours per day of interdisciplinary therapy in a comprehensive inpatient rehab setting. Physiatrist is providing close team supervision and 24 hour management of active medical problems listed  below. Physiatrist and rehab team continue to assess barriers to discharge/monitor patient progress toward functional and medical goals.  Function:  Bathing Bathing position Bathing activity did not occur: Refused Position: Wheelchair/chair at sink  Bathing parts Body parts bathed by patient: Right arm, Left arm, Buttocks, Right upper leg, Left upper leg, Right lower leg, Left lower leg, Front perineal area, Chest Body parts bathed by helper: Buttocks, Front perineal area  Bathing assist Assist Level: Touching or steadying assistance(Pt > 75%), Assistive device Assistive Device Comment: long-handled sponge Set up : To obtain items  Upper Body Dressing/Undressing Upper body dressing Upper body dressing/undressing activity did not occur: Safety/medical concerns What is the patient wearing?: Pull over shirt/dress     Pull over shirt/dress - Perfomed by patient: Thread/unthread right sleeve, Thread/unthread left sleeve, Put head through opening Pull over shirt/dress - Perfomed by helper: Pull shirt over trunk        Upper body assist Assist Level: Touching or steadying assistance(Pt > 75%)   Set up : To obtain clothing/put away  Lower Body Dressing/Undressing Lower body dressing Lower  body dressing/undressing activity did not occur: Safety/medical concerns What is the patient wearing?: Pants, Ted Hose, Shoes, Non-skid slipper socks     Pants- Performed by patient: Thread/unthread right pants leg, Thread/unthread left pants leg Pants- Performed by helper: Pull pants up/down, Thread/unthread right pants leg, Thread/unthread left pants leg Non-skid slipper socks- Performed by patient: Don/doff right sock Non-skid slipper socks- Performed by helper: Don/doff left sock, Don/doff right sock   Socks - Performed by helper: Don/doff right sock, Don/doff left sock   Shoes - Performed by helper: Don/doff right shoe, Don/doff left shoe, Fasten right, Fasten left       TED Hose - Performed  by helper: Don/doff right TED hose, Don/doff left TED hose  Lower body assist Assist for lower body dressing: 2 Designer, multimediaHelpers      Toileting Toileting Toileting activity did not occur: No continent bowel/bladder event Toileting steps completed by patient: Performs perineal hygiene Toileting steps completed by helper: Adjust clothing prior to toileting, Adjust clothing after toileting Toileting Assistive Devices: Grab bar or rail  Toileting assist Assist level: Touching or steadying assistance (Pt.75%)   Transfers Chair/bed transfer   Chair/bed transfer method: Ambulatory Chair/bed transfer assist level: Touching or steadying assistance (Pt > 75%) Chair/bed transfer assistive device: Armrests, Patent attorneyWalker     Locomotion Ambulation     Max distance: 150 Assist level: Touching or steadying assistance (Pt > 75%)   Wheelchair   Type: Manual Max wheelchair distance: 80 ft Assist Level: Supervision or verbal cues  Cognition Comprehension Comprehension assist level: Follows basic conversation/direction with no assist  Expression Expression assist level: Expresses basic needs/ideas: With no assist  Social Interaction Social Interaction assist level: Interacts appropriately 50 - 74% of the time - May be physically or verbally inappropriate.  Problem Solving Problem solving assist level: Solves basic 50 - 74% of the time/requires cueing 25 - 49% of the time  Memory Memory assist level: Recognizes or recalls 50 - 74% of the time/requires cueing 25 - 49% of the time    Medical Problem List and Plan: 1. Debility and encephalopathy secondary to small bowel obstruction and associated complications   Cont CIR  Cont NPO   No crushed meds through feeding tube  Enterocutaneous fistula, ?limiting tube feedings to 30 cc/h, however, Dr. Michaell CowingGross placed order for 16 hours/day @35ccs , cont TPN 2. DVT Prophylaxis/Anticoagulation: Pharmaceutical: Lovenox  3. Pain Management: IV morphine every 4 hours prn severe  pain  4. Mood: team to provide ego support. LCSW to follow for evaluation and support.   Prozac started 1/18 5. Neuropsych: This patient is not fully capable of making decisions on his own behalf.  6. Skin/Wound Care: Continue air mattress overlay. Continue large Eakin's pouch to manage wound drainage. WOC for management. Encourage side lying to help manage MASD.  7. Malnutrition/Fluids/Electrolytes/Nutrition: continue TPN.   Albumin 1.7 on 2/12  Swallow eval completed with recs for free water protocol. 8. Abdominal abscess: Leucocytosis resolved. Completed rocephin 1/17 9. Anemia of chronic illness:   Hb 10.7 on 2/12  Cont to monitor 10. Electrolyte abnormality/Acute renal insufficiency: Managed by TNA   Mag stable 1.7 2/12 11. Encephalopathy: Improving.  12. Hyperglycemia  Overall controlled 2/12 13 Sleep disturbance  Has tried Haldol without benefit, ativan causes hallucinations  Continue Xanax elixer,  Improved 14. Mild hyponatremia:   Na+ 134 on 2/12 15. Secondary HTN  Controlled 2/12  Cont to monitor  LOS (Days) 31 A FACE TO FACE EVALUATION WAS PERFORMED  Prerana Strayer Karis Jubanil [place] Forquer 03/28/2016  9:36 AM

## 2016-03-28 NOTE — Progress Notes (Signed)
Family plans on taking patient home and would like to make sure all meds available at discharge as it has to be in elixir form. [facility]. Rx for alprazolam 1 mg/ml--0.5 ml at bedtime/MR x 1 # 20 ml given to daughter. Contacted [facility] rep--they will manage TPN, IVF as well as insulin needs and cbgs not needed after discharge.  Advised then to call [facility] for input/recommendations.

## 2016-03-29 ENCOUNTER — Inpatient Hospital Stay (HOSPITAL_COMMUNITY): Payer: Medicare Other | Admitting: Occupational Therapy

## 2016-03-29 ENCOUNTER — Inpatient Hospital Stay (HOSPITAL_COMMUNITY): Payer: Medicare Other | Admitting: Speech Pathology

## 2016-03-29 ENCOUNTER — Inpatient Hospital Stay (HOSPITAL_COMMUNITY): Payer: Medicare Other | Admitting: Physical Therapy

## 2016-03-29 MED ORDER — FAT EMULSION 20 % IV EMUL
240.0000 mL | INTRAVENOUS | Status: AC
  Administered 2016-03-29: 240 mL via INTRAVENOUS
  Filled 2016-03-29: qty 250

## 2016-03-29 MED ORDER — CLINIMIX E/DEXTROSE (5/15) 5 % IV SOLN
INTRAVENOUS | Status: AC
  Administered 2016-03-29: 18:00:00 via INTRAVENOUS
  Filled 2016-03-29 (×2): qty 1560

## 2016-03-29 MED ORDER — SODIUM CHLORIDE 0.9% FLUSH: 10.0000 mL | INTRAVENOUS | Status: DC | PRN

## 2016-03-29 NOTE — Progress Notes (Signed)
PHARMACY - TOTAL PARENTERAL NUTRITION CONSULT NOTE   Pharmacy Consult:  TPN  Indication:  SBO and new EC fistula 02/13/16  Patient Measurements: Height: 6\' 2"  (188 cm) Weight: 160 lb 15 oz (73 kg) IBW/kg (Calculated) : 82.2 Body mass index is 20.66 kg/m.  Assessment:  [age] YOM with history of multiple procedures and extended hospitalization for recurrent partial GOO 2/2 pyloric stricture.  Patient was on TPN for several days, then discharged on 12/29/16 with J/G tube in place for TF. He was unable to tolerate TF and [facility]  Patient was readmitted on 01/19/17 with partial SBO.  Patient failed MBS on 01/26/16 and did not tolerate bolus feeds through G-tube on 01/27/16. On 12/23, he underwent ex-lap with LoA, segmental SBR, and gastrojejunostomy. On 02/12/16, TF were found draining from midline incision with CT abd showing bowel perforation at GJ site and development of abscesses. Failed MBS on 03/25/16.   GI: EC fistula at SB jejunal anastomosis which is decreasing in size. Has had trouble advancing TFs so had been advancing very slowly per surgery. Failed MBS 03/25/16.  Albumin 2, prealbumin 31. Drain output -885 mL / 24h. On Erythromycin Endo: no hx DM, am glucose wnl, no SSI  Lytes: Mg 1.7, Coca 10.1, other lytes wnl. Ca x Phos product = 41 Renal: SCr and BUN stable. NS at 100 ml/hr daily for 10 hours Pulm: RA  Cards: orthostatic hypotension improved - VSS Hepatobil: LFTs wnl, Tbili 0.2, TG 135 Neuro: Prozac, lidocaine patch, PRN morphine, prn xanax, APAP TID  Nicoderm Heme/Onc: S/p Iron dextran ID: S/p ceftriaxone for UTI. UCx negative. Afebrile, WBC wnl Best Practices: Lovenox, MC TPN Access: PICC TPN start date: [facility] > [facility] 01/20/16 > [facility] 02/26/15  Home TPN: 2156 kCal and 75gm protein per day (405g CHO = 1376 kCal, 50g lipid = 480 kCal)  Nutritional Goals: per RD recs on 2/7 Kcal: 2100-2300 Protein: 95-110 g  Current Nutrition:  Clinimix E  5/15 + lipids Jevity 1.2 at 3035ml/hr for 16 hrs (672 kcal and 31.5 g of protein) NPO except ice chips  Plan: -Continue cyclic Clinimix E 5/15. Infuse 1560 ml over 12 hrs: 50 mL/hr x 1 hr, then 146 mL/hr x 10 hrs, then 50 mL/hr x 1 hr. -Continue IV lipid emulsions 2820ml/hr over 12 hrs -Continue Jevity 1.2 at 10235ml/hr for 16 hrs -TPN, IV lipid emulsions, and TFs provides ~ 110 g of protein and 2260 kCals per day meeting 100% of protein and kCal needs -MVI to per tube -Monitor TPN labs -See Surgery's note 2/12 for overall plan    Agapito GamesAlison Inocente Krach, PharmD, BCPS Clinical Pharmacist 03/29/2016 8:15 AM

## 2016-03-29 NOTE — Progress Notes (Signed)
[place] PHYSICAL MEDICINE & REHABILITATION     PROGRESS NOTE  Subjective/Complaints: Pt seen laying in bed this AM.  He slept well overnight.  His vocal output is improving.  He is looking forward to going home tomorrow.   ROS: Denies vomiting, diarrhea, shortness of breath or chest pain.  Objective: Vital Signs: Blood pressure 130/60, pulse 80, temperature 98.3 F (36.8 C), temperature source Oral, resp. rate 18, height 6\' 2"  (1.88 m), weight 73 kg (160 lb 15 oz), SpO2 98 %. No results found.  Recent Labs  03/28/16 0458  WBC 6.8  HGB 10.7*  HCT 33.0*  PLT 321    Recent Labs  03/28/16 0458  NA 134*  K 4.1  CL 104  GLUCOSE 101*  BUN 22*  CREATININE 0.69  CALCIUM 8.5*   CBG (last 3)  No results for input(s): GLUCAP in the last 72 hours.  Wt Readings from Last 3 Encounters:  03/29/16 73 kg (160 lb 15 oz)  02/26/16 78.6 kg (173 lb 4.5 oz)  01/19/16 79.2 kg (174 lb 8 oz)    Physical Exam:  BP 130/60 (BP Location: Left Arm)   Pulse 80   Temp 98.3 F (36.8 C) (Oral)   Resp 18   Ht 6\' 2"  (1.88 m)   Wt 73 kg (160 lb 15 oz)   SpO2 98%   BMI 20.66 kg/m  Constitutional: NAD. Vital signs reviewed. Frail.  HENT: Normocephalic and atraumatic.  Eyes: EOMI. No discharge.  Cardiovascular: RRR. No JVD. Respiratory: CTA bilaterally. Unlabored.  GI: He exhibits no distension. BS+. +Eakins pouch. GJ tube.  Musculoskeletal: He exhibits no edema. He exhibits no tenderness.  Neurological: He is alert.  Speech with dysarthria, improving Motor: 4+/5 throughout (stable) Skin: Skin is warm and dry. Abdominal wounds with dressing.  Psych: Normal mood and affect.   Assessment/Plan: 1. Functional deficits secondary to debility and encephalopathy which require 3+ hours per day of interdisciplinary therapy in a comprehensive inpatient rehab setting. Physiatrist is providing close team supervision and 24 hour management of active medical problems listed below. Physiatrist and  rehab team continue to assess barriers to discharge/monitor patient progress toward functional and medical goals.  Function:  Bathing Bathing position Bathing activity did not occur: Refused Position: Wheelchair/chair at sink  Bathing parts Body parts bathed by patient: Right arm, Left arm, Buttocks, Right upper leg, Left upper leg, Right lower leg, Left lower leg, Front perineal area, Chest Body parts bathed by helper: Buttocks, Front perineal area  Bathing assist Assist Level: Touching or steadying assistance(Pt > 75%), Assistive device Assistive Device Comment: long-handled sponge Set up : To obtain items  Upper Body Dressing/Undressing Upper body dressing Upper body dressing/undressing activity did not occur: Safety/medical concerns What is the patient wearing?: Pull over shirt/dress     Pull over shirt/dress - Perfomed by patient: Thread/unthread right sleeve, Thread/unthread left sleeve, Put head through opening Pull over shirt/dress - Perfomed by helper: Pull shirt over trunk        Upper body assist Assist Level: Touching or steadying assistance(Pt > 75%)   Set up : To obtain clothing/put away  Lower Body Dressing/Undressing Lower body dressing Lower body dressing/undressing activity did not occur: Safety/medical concerns What is the patient wearing?: Shoes     Pants- Performed by patient: Thread/unthread right pants leg, Thread/unthread left pants leg Pants- Performed by helper: Pull pants up/down, Thread/unthread right pants leg, Thread/unthread left pants leg Non-skid slipper socks- Performed by patient: Don/doff right sock Non-skid slipper  socks- Performed by helper: Don/doff left sock, Don/doff right sock   Socks - Performed by helper: Don/doff right sock, Don/doff left sock Shoes - Performed by patient: Don/doff right shoe, Don/doff left shoe, Fasten left Shoes - Performed by helper: Fasten right       TED Hose - Performed by helper: Don/doff right TED hose,  Don/doff left TED hose  Lower body assist Assist for lower body dressing: 2 Helpers      Toileting Toileting Toileting activity did not occur: No continent bowel/bladder event Toileting steps completed by patient: Performs perineal hygiene Toileting steps completed by helper: Adjust clothing prior to toileting, Adjust clothing after toileting Toileting Assistive Devices: Grab bar or rail  Toileting assist Assist level: Touching or steadying assistance (Pt.75%)   Transfers Chair/bed transfer   Chair/bed transfer method: Ambulatory Chair/bed transfer assist level: Supervision or verbal cues Chair/bed transfer assistive device: Armrests, Patent attorneyWalker     Locomotion Ambulation     Max distance: 220 ft Assist level: Supervision or verbal cues   Wheelchair   Type: Manual Max wheelchair distance: 80 ft Assist Level: Supervision or verbal cues  Cognition Comprehension Comprehension assist level: Understands basic 90% of the time/cues < 10% of the time  Expression Expression assist level: Expresses basic 75 - 89% of the time/requires cueing 10 - 24% of the time. Needs helper to occlude trach/needs to repeat words.  Social Interaction Social Interaction assist level: Interacts appropriately 75 - 89% of the time - Needs redirection for appropriate language or to initiate interaction.  Problem Solving Problem solving assist level: Solves basic 75 - 89% of the time/requires cueing 10 - 24% of the time  Memory Memory assist level: Recognizes or recalls 75 - 89% of the time/requires cueing 10 - 24% of the time    Medical Problem List and Plan: 1. Debility and encephalopathy secondary to small bowel obstruction and associated complications   Cont CIR, plan to d/c tomorrow  Cont NPO   No crushed meds through feeding tube  Enterocutaneous fistula, ?limiting tube feedings to 30 cc/h, however, Dr. Michaell CowingGross placed order for 16 hours/day @35ccs , cont TPN 2. DVT Prophylaxis/Anticoagulation: Pharmaceutical:  Lovenox  3. Pain Management: IV morphine every 4 hours prn severe pain  4. Mood: team to provide ego support. LCSW to follow for evaluation and support.   Prozac started 1/18 5. Neuropsych: This patient is not fully capable of making decisions on his own behalf.  6. Skin/Wound Care: Continue air mattress overlay. Continue large Eakin's pouch to manage wound drainage. WOC for management. Encourage side lying to help manage MASD.  7. Malnutrition/Fluids/Electrolytes/Nutrition: continue TPN.   Albumin 1.7 on 2/12  Swallow eval completed with recs for free water protocol. 8. Abdominal abscess: Leucocytosis resolved. Completed rocephin 1/17 9. Anemia of chronic illness:   Hb 10.7 on 2/12  Cont to monitor 10. Electrolyte abnormality/Acute renal insufficiency: Managed by TNA   Mag stable 1.7 2/12 11. Encephalopathy: Improving.  12. Hyperglycemia  Overall controlled 2/13 13 Sleep disturbance  Has tried Haldol without benefit, ativan causes hallucinations  Continue Xanax elixer,  Improved 14. Mild hyponatremia:   Na+ 134 on 2/12 15. Secondary HTN  Controlled 2/13  Cont to monitor  LOS (Days) 32 A FACE TO FACE EVALUATION WAS PERFORMED  Renesha Lizama Karis Jubanil Terrel Manalo 03/29/2016 10:13 AM

## 2016-03-29 NOTE — Progress Notes (Signed)
Speech Language Pathology Discharge Summary  Patient Details  Name: [name] MRN: [id number] Date of Birth: Apr 09, 1940  Today's Date: 03/29/2016 SLP Individual Time: 0830-0930 SLP Individual Time Calculation (min): 60 min   Skilled Therapeutic Interventions:  Skilled treatment session focused on cognition and dysphagia goals. SLP facilitated session by completing discharge education, wife and pt on use of respiratory trainer, pt able to return demonstration of expiratory trainer. Education provided on benefits of trainer on swallow function and speech intelligibility. Also reviewed use of memory book and speech intelligibility strategies.  Pt's speech intelligibility was reduced this session and required Mod A verbal cues to achieve ~75% intelligibility at the phrase level. Pt completed 5 reps with trainer set at 45 cmH2O with perceived level of exertion of 8. Pt able to recall orientation information with supervision cues for use of memory book. All questions to pt and family satisfaction at this time.     Patient has met 2 of 5 long term goals.  Patient to discharge at overall Mod level.  Reasons goals not met: Pt required Mod A verbal assist for speech intelligiblity at the sentence level, emergent awareness and swallowing   Clinical Impression/Discharge Summary: Pt continues to exhibit moderate deficits in the areas of emergent awareness, speech intelligibility, semi-complex problem solving and higher level attention deficits. Pt also continues to demonstrate severe oropharyngeal dysphagia and remains NPO at this time with trials of thin liquids via 1/2 tsp with SLP only. Modified Barium Swallow Study on 03/25/16 revealed aspiration of nectar thick with excessive gagging on thin liquids. Pt is frequently resistant to trials of thin liquids and required Max encouragement.    Care Partner:  Caregiver Able to Provide Assistance: Yes  Type of Caregiver Assistance:  Cognitive;Physical  Recommendation:  Home Health SLP  Rationale for SLP Follow Up: Maximize functional communication;Maximize cognitive function and independence;Maximize swallowing safety;Reduce caregiver burden   Equipment: None   Reasons for discharge: Lack of progress towards goals   Patient/Family Agrees with Progress Made and Goals Achieved: Yes   Function:   Cognition Comprehension Comprehension assist level: Understands basic 90% of the time/cues < 10% of the time  Expression   Expression assist level: Expresses basic 75 - 89% of the time/requires cueing 10 - 24% of the time. Needs helper to occlude trach/needs to repeat words.  Social Interaction Social Interaction assist level: Interacts appropriately 75 - 89% of the time - Needs redirection for appropriate language or to initiate interaction.  Problem Solving Problem solving assist level: Solves basic 75 - 89% of the time/requires cueing 10 - 24% of the time  Memory Memory assist level: Recognizes or recalls 75 - 89% of the time/requires cueing 10 - 24% of the time    [name] B. Rutherford Nail, M.S., CCC-SLP Speech-Language Pathologist [name] 03/29/2016, 8:58 AM

## 2016-03-29 NOTE — Plan of Care (Signed)
Problem: RH BLADDER ELIMINATION Goal: RH STG MANAGE BLADDER WITH ASSISTANCE STG Manage Bladder With min Assistance   Outcome: Not Progressing Patient still using condom cath

## 2016-03-29 NOTE — Progress Notes (Signed)
Occupational Therapy Discharge Summary  Patient Details  Name: [name] MRN: [id number] Date of Birth: [birth year]  Today's Date: 03/29/2016 OT Individual Time: 1000-1059 OT Individual Time Calculation (min): 59 min   Pt greeted in bed with family present. Pt/family education completed regarding functional transfers, timed toileting, and activity modifications. Collaborated with caregivers on safe ADL participation within home environment and discussed adaptations to pt's daily routine once dc. Pt ambulated from bed>bathroom with close supervision and min guard  A when turning to sit onto raised toilet. Pt required Min A for toileting after + Bm. Pt completed UB bathing/dressing with overall set-up A and overall Min A for LB ADLs using ADL Ae. Returned to bed at end of session per RN request for picc line.   Patient has met 11 of 11 long term goals due to improved activity tolerance, improved balance, postural control, ability to compensate for deficits, improved attention, improved awareness and improved coordination.  Patient to discharge at [facility] Assist level.  Patient's care partner is independent to provide the necessary physical and cognitive assistance at discharge.    Reasons goals not met: n/a  Recommendation:  Patient will benefit from ongoing skilled OT services in home health setting to continue to advance functional skills in the area of BADL and Reduce care partner burden.  Equipment: 3-in-1 BSC  Reasons for discharge: treatment goals met and discharge from hospital  Patient/family agrees with progress made and goals achieved: Yes  OT Discharge Precautions/Restrictions  Precautions Precautions: Fall Restrictions Weight Bearing Restrictions: No Pain Pain Assessment Pain Assessment: No/denies pain ADL ADL Equipment Provided: Reacher, Sock aid, Long-handled shoe horn, Long-handled sponge Eating: NPO Grooming: Supervision/safety, Setup Where  Assessed-Grooming: Sitting at sink Upper Body Bathing: Supervision/safety, Setup Where Assessed-Upper Body Bathing: Sitting at sink Lower Body Bathing: Minimal assistance Where Assessed-Lower Body Bathing: Sitting at sink Upper Body Dressing: Supervision/safety, Setup Where Assessed-Upper Body Dressing: Sitting at sink Lower Body Dressing: Minimal assistance Where Assessed-Lower Body Dressing: Sitting at sink Toileting: Minimal assistance Where Assessed-Toileting: Glass blower/designer: Psychiatric nurse Method: Counselling psychologist: Raised toilet seat, Grab bars ADL Comments: Please see functional navigator Cognition Overall Cognitive Status: Impaired/Different from baseline Arousal/Alertness: Awake/alert Orientation Level: Oriented X4 Attention: Sustained Focused Attention: Appears intact Focused Attention Impairment: Verbal complex;Functional complex Sustained Attention: Appears intact Sustained Attention Impairment: Verbal basic;Functional basic Memory: Impaired Memory Impairment: Decreased short term memory;Retrieval deficit Decreased Short Term Memory: Verbal basic;Functional basic Awareness: Impaired Awareness Impairment: Emergent impairment Problem Solving: Impaired Problem Solving Impairment: Verbal complex;Functional complex Executive Function: Reasoning;Initiating;Self Monitoring;Self Correcting Reasoning: Impaired Reasoning Impairment: Verbal complex;Functional complex Initiating: Impaired Initiating Impairment: Verbal basic;Functional basic Self Monitoring: Impaired Self Monitoring Impairment: Verbal basic;Functional basic Self Correcting: Impaired Self Correcting Impairment: Verbal basic;Functional basic Behaviors: Lability Safety/Judgment: Impaired Forensic psychologist Sitting Balance Static Sitting - Balance Support: No upper extremity supported Static Sitting - Level of Assistance: 6: Modified independent (Device/Increase  time) Static Standing Balance Static Standing - Balance Support: During functional activity Static Standing - Level of Assistance: 5: Stand by assistance Dynamic Standing Balance Dynamic Standing - Balance Support: During functional activity Dynamic Standing - Level of Assistance: 4: Min assist Dynamic Standing - Comments: bathing/dressing tasks Extremity/Trunk Assessment RUE Assessment RUE Assessment: Within Functional Limits LUE Assessment LUE Assessment: Within Functional Limits   See Function Navigator for Current Functional Status.  Daneen Schick Trice Aspinall 03/29/2016, 4:19 PM

## 2016-03-29 NOTE — Progress Notes (Signed)
Per order G-port clamped and flushed with air. Patient tolerated procedure well with no adverse effect. G-tube gastric residual 35 mL. G-port draining with gravity.

## 2016-03-29 NOTE — Plan of Care (Signed)
Problem: RH Bed Mobility Goal: LTG Patient will perform bed mobility with assist (PT) LTG: Patient will perform bed mobility with assistance, with/without cues (PT).  Outcome: Not Met (add Reason) Pt unable to achieve sit<-> supine transfers supervision without HOB elevated or bedrails

## 2016-03-29 NOTE — Progress Notes (Signed)
Per MD order G-port clamped and flushed with air. Patient tolerated procedure well, no adverse effect noted. Gastric residual was 25 mL.

## 2016-03-29 NOTE — Progress Notes (Signed)
Social Work Patient ID: [name], male   DOB: March 14, 1940, 76 y.o.   MRN: [id number]  Daughter and wife here to go over tube feedings education in preparation for discharge tomorrow. Equipment to be delivered to home today-wife plans to go home to be there for it and daughter to stay here to finish up education with therapy team. Pam-PA has given wife prescriptions to get filled today so Pharmacy will have for tomorrow.

## 2016-03-29 NOTE — Progress Notes (Signed)
Peripherally Inserted Central Catheter/Midline Placement  The IV Nurse has discussed with the patient and/or persons authorized to consent for the patient, the purpose of this procedure and the potential benefits and risks involved with this procedure.  The benefits include less needle sticks, lab draws from the catheter, and the patient may be discharged home with the catheter. Risks include, but not limited to, infection, bleeding, blood clot (thrombus formation), and puncture of an artery; nerve damage and irregular heartbeat and possibility to perform a PICC exchange if needed/ordered by physician.  Alternatives to this procedure were also discussed.  Bard Power PICC patient education guide, fact sheet on infection prevention and patient information card has been provided to patient /or left at bedside.    PICC/Midline Placement Documentation  PICC Single Lumen 03/29/16 PICC Left Basilic 46 cm 0 cm (Active)  Indication for Insertion or Continuance of Line Home intravenous therapies (PICC only) 03/29/2016 11:50 AM  Exposed Catheter (cm) 0 cm 03/29/2016 11:50 AM  Dressing Change Due 04/05/16 03/29/2016 11:50 AM   Consent signed by the wife per patient's request.    Romie JumperAlford, Macky Galik Terry 03/29/2016, 11:54 AM

## 2016-03-29 NOTE — Progress Notes (Signed)
Physical Therapy Discharge Summary  Patient Details  Name: [name] MRN: [id number] Date of Birth: 1941/01/22  Today's Date: 03/29/2016 PT Individual Time: 1400-1515 PT Individual Time Calculation (min): 75 min    Patient has met 9 of 11 long term goals due to improved activity tolerance, improved balance, improved postural control, increased strength, decreased pain and improved attention.  Patient to discharge at an ambulatory level [place].   Patient's care partner is independent to provide the necessary physical and cognitive assistance at discharge.   Reasons goals not met: Pt didn't reach car transfer goal of min A due to not having the necessary LE strength to stand up and exit vehicle without increased level of assistance from caregiver or therapy. Pt requires up to min A with bed mobility in hospital bed with bedrails and HOB elevated.   Recommendation:  Patient will benefit from ongoing skilled PT services in home health setting to continue to advance safe functional mobility, address ongoing impairments in BLE strength, endurance, balance, activity tolerance, functional mobility, and to minimize fall risk and reduce caregiver burden.  Equipment: Hospital bed and transport chair  Reasons for discharge: discharge from hospital  Patient/family agrees with progress made and goals achieved: Yes   Summary Pt is overall supervision with ambulation, transfers, standing tolerance, and bed rolling. Pt continues to require min A with sit<->supine when unable to use bedrails or elevate the HOB. Pt requires min-mod A with car transfers. Pt demonstrated steady improvement in TUG balance scores from 31 seconds to 25 seconds. Daughter has demonstrated adequate ability to provide the necessary assist and supervision for the pt upon discharge to home. Daughter was informed and agreed with plan to have HHPT.   Skilled Intervention Pt was supine in bed with daughter present in room upon  arrival. Pt wasn't complaining of any pain, but needed assistance with recent urinary incontinence episode in bed. Pt performed supine to sit at EOB supervision with HOB elevated and with bedrails. Pt ambulated 10 ft with RW supervision to commode to clean up and change clothes. Pt was dependent with removing pants, briefs, and shirt to avoid continued contact with soiled clothing. Pt was supervision with sit-to-stand with RW from commode and throughout therapy. Pt was dependent with donning new brief, pants, and shoes and supervision with donning shirt after toileting. Pt ambulated 225 ft to rehab gym using RW supervision with daughter providing adequate guarding. Daughter provided mod A lifting both legs to help pt perform sit-> supine and min A with return to EOB on standard mattress. Pt demonstrated ascending and descending forward 12 steps close supervision. With car transfer, pt demonstrated entering car at supervision level, however required mod A and several attempts to exit the lower level car. Pt demonstrated ambulating 10 ft over uneven surface with close supervision and verbal cues for sequence and proper use of Rw. Pt required min A to pick up a small item off the floor without falling or using a Rw. Pt performed the TUG outcome measure and scored an average of 25 seconds, indicating an increased chance of falling. Pt was returned to room and left sitting up in W/C with all needs within reach and daughter present in room. Daughter and family with no further questions/concerns regarding D/C to home.    PT Discharge Precautions/Restrictions Precautions Precautions: Fall Restrictions Weight Bearing Restrictions: No Pain Pain Assessment Pain Assessment: No/denies pain Vision/Perception  Baseline: wears glasses Wears glasses: reading only Patient visual report: no change from baseline  Cognition Overall Cognitive Status: Impaired/Different from baseline Arousal/Alertness:  Awake/alert Orientation Level: Oriented X4 Attention: Sustained Focused Attention: Appears intact Focused Attention Impairment: Verbal complex;Functional complex Sustained Attention: Appears intact Sustained Attention Impairment: Verbal basic;Functional basic Memory: Impaired Memory Impairment: Decreased short term memory;Retrieval deficit Decreased Short Term Memory: Verbal basic;Functional basic Awareness: Impaired Awareness Impairment: Emergent impairment Problem Solving: Impaired Problem Solving Impairment: Verbal complex;Functional complex Executive Function: Reasoning;Initiating;Self Monitoring;Self Correcting Reasoning: Impaired Reasoning Impairment: Verbal complex;Functional complex Initiating: Impaired Initiating Impairment: Verbal basic;Functional basic Self Monitoring: Impaired Self Monitoring Impairment: Verbal basic;Functional basic Self Correcting: Impaired Self Correcting Impairment: Verbal basic;Functional basic Behaviors: Lability Safety/Judgment: Impaired Sensation Sensation Light Touch: Appears Intact Proprioception: Appears Intact Coordination Gross Motor Movements are Fluid and Coordinated: No Fine Motor Movements are Fluid and Coordinated: No Motor  Motor Motor: Other (comment) Motor - Discharge Observations: general weakness but significant improvement from admissions  Mobility Bed Mobility Bed Mobility: Rolling Right;Rolling Left;Supine to Sit;Sit to Supine Rolling Right: 5: Supervision Rolling Right Details: Verbal cues for technique;Verbal cues for sequencing Rolling Left: 5: Supervision Rolling Left Details: Verbal cues for technique;Verbal cues for sequencing Supine to Sit: 5: Supervision Supine to Sit Details: Verbal cues for sequencing;Verbal cues for technique;Verbal cues for precautions/safety;Manual facilitation for weight shifting Sit to Supine: 4: Min assist Sit to Supine - Details: Verbal cues for technique;Verbal cues for  sequencing;Manual facilitation for weight shifting Transfers Transfers: Yes Sit to Stand: 5: Supervision;With upper extremity assist;With armrests Sit to Stand Details: Verbal cues for precautions/safety;Verbal cues for technique;Verbal cues for sequencing;Verbal cues for safe use of DME/AE Stand to Sit: 5: Supervision Stand to Sit Details (indicate cue type and reason): Verbal cues for precautions/safety;Verbal cues for technique;Verbal cues for sequencing;Verbal cues for safe use of DME/AE Stand Pivot Transfers: 5: Supervision Stand Pivot Transfer Details: Verbal cues for precautions/safety;Verbal cues for safe use of DME/AE;Verbal cues for technique;Verbal cues for sequencing Locomotion  Ambulation Ambulation: Yes Ambulation/Gait Assistance: 5: Supervision Assistive device: Rolling walker Ambulation/Gait Assistance Details: Verbal cues for technique;Verbal cues for sequencing;Verbal cues for safe use of DME/AE Gait Gait: Yes Gait Pattern: Within Functional Limits Gait Pattern: Within Functional Limits Stairs / Additional Locomotion Stairs: Yes Stairs Assistance: 5: Supervision Stairs Assistance Details: Verbal cues for sequencing;Verbal cues for technique Stair Management Technique: Two rails;Forwards;Step to pattern Height of Stairs: 3 Ramp: Not tested (comment) (due to fatigue) Curb: 5: Psychiatric nurse: Yes Wheelchair Assistance: 5: Investment banker, operational Details: Verbal cues for technique;Verbal cues for Astronomer: Both upper extremities Wheelchair Parts Management: Needs assistance  Trunk/Postural Assessment  Cervical Assessment Cervical Assessment: Exceptions to [facility] (forward head position) Thoracic Assessment Thoracic Assessment: Exceptions to [facility] (kyphotic posture) Lumbar Assessment Lumbar Assessment: Exceptions to [facility] (posterior pelvic tilt; increased lumbar flexion) Postural Control Postural  Control: Deficits on evaluation Righting Reactions: improved since evaluation- reduced posterior LOB episodes Protective Responses: reduced  Balance Balance Balance Assessed: Yes Standardized Balance Assessment Standardized Balance Assessment: Timed Up and Go Test Timed Up and Go Test TUG: Normal TUG Normal TUG (seconds): 25 Static Sitting Balance Static Sitting - Balance Support: No upper extremity supported Static Sitting - Level of Assistance: 6: Modified independent (Device/Increase time) Dynamic Sitting Balance Dynamic Sitting - Balance Support: No upper extremity supported Dynamic Sitting - Level of Assistance: 6: Modified independent (Device/Increase time) Static Standing Balance Static Standing - Balance Support: During functional activity Static Standing - Level of Assistance: 5: Stand by assistance Dynamic Standing Balance Dynamic Standing - Balance Support: During functional activity  Dynamic Standing - Level of Assistance: 5: Stand by assistance Extremity Assessment  RUE Assessment RUE Assessment: Within Functional Limits LUE Assessment LUE Assessment: Within Functional Limits RLE Assessment RLE Assessment: Within Functional Limits RLE Strength RLE Overall Strength Comments: 4/5 for hip flexion; 4+/5 for knee ext/flex, DF/PF LLE Assessment LLE Assessment: Within Functional Limits LLE Strength LLE Overall Strength Comments: 4+/5 proximal to distal    See Function Navigator for Current Functional Status.  [name] 03/29/2016, 5:14 PM

## 2016-03-29 NOTE — Progress Notes (Signed)
Per order by Karie SodaSteven Gross, MD, G-port clamped at 0800.G-tube put out approximately 17 mL of gastric residual. Per order G-tube flushed with air. Patient tolerated procedure well with no adverse effects.

## 2016-03-29 NOTE — Progress Notes (Signed)
At 2200 G tube residual checked to be 15ml. Per order G tube flushed with air. Patient tolerated well.

## 2016-03-29 NOTE — Plan of Care (Signed)
Problem: RH BOWEL ELIMINATION Goal: RH STG MANAGE BOWEL WITH ASSISTANCE STG Manage Bowel with min Assistance.   Outcome: Not Progressing Patient with occasional incontinent episodes-max assist  Problem: RH BLADDER ELIMINATION Goal: RH STG MANAGE BLADDER WITH ASSISTANCE STG Manage Bladder With min Assistance   Outcome: Not Progressing Patient incontinent at times- [facility] brief change max assist  Problem: RH SKIN INTEGRITY Goal: RH STG ABLE TO PERFORM INCISION/WOUND CARE W/ASSISTANCE STG Able To Perform Incision/Wound Care With min Assistance.   Outcome: Not Progressing Will require total assist with dressing changes at discharge

## 2016-03-30 MED ORDER — PROCHLORPERAZINE 25 MG RE SUPP: 25.0000 mg | Freq: Two times a day (BID) | RECTAL | 0 refills | Status: DC | PRN

## 2016-03-30 MED ORDER — ADULT MULTIVITAMIN LIQUID CH: 15.0000 mL | Freq: Every day | ORAL | Status: DC

## 2016-03-30 MED ORDER — JEVITY 1.2 CAL PO LIQD: 1000.0000 mL | ORAL | 0 refills | Status: DC

## 2016-03-30 MED ORDER — BLISTEX MEDICATED EX OINT
TOPICAL_OINTMENT | Freq: Two times a day (BID) | CUTANEOUS | Status: DC
  Filled 2016-03-30: qty 6.3

## 2016-03-30 MED ORDER — FAT EMULSION 20 % IV EMUL: 240.0000 mL | INTRAVENOUS | Status: DC

## 2016-03-30 NOTE — Progress Notes (Signed)
G tube gastric residuals were 50ml at 0515. Bolus of air given per order. Patient tolerated well.

## 2016-03-30 NOTE — Progress Notes (Addendum)
[facility]ILIT[facility]     PROGRESS NOTE  Subjective/Complaints: Pt seen laying in bed this AM.  He slept well overnight and is excited about being discharged this AM.    ROS: Denies vomiting, diarrhea, shortness of breath or chest pain.  Objective: Vital Signs: Blood pressure (!) 158/48, pulse 74, temperature 98 F (36.7 C), temperature source Oral, resp. rate 18, height 6\' 2"  (1.88 m), weight 74 kg (163 lb 2.3 oz), SpO2 98 %. No results found.  Recent Labs  03/28/16 0458  WBC 6.8  HGB 10.7*  HCT 33.0*  PLT 321    Recent Labs  03/28/16 0458  NA 134*  K 4.1  CL 104  GLUCOSE 101*  BUN 22*  CREATININE 0.69  CALCIUM 8.5*   CBG (last 3)  No results for input(s): GLUCAP in the last 72 hours.  Wt Readings from Last 3 Encounters:  03/30/16 74 kg (163 lb 2.3 oz)  02/26/16 78.6 kg (173 lb 4.5 oz)  01/19/16 79.2 kg (174 lb 8 oz)    Physical Exam:  BP (!) 158/48 (BP Location: Right Arm)   Pulse 74   Temp 98 F (36.7 C) (Oral)   Resp 18   Ht 6\' 2"  (1.88 m)   Wt 74 kg (163 lb 2.3 oz)   SpO2 98%   BMI 20.95 kg/m  Constitutional: NAD. Vital signs reviewed. Frail.  HENT: Normocephalic and atraumatic.  Eyes: EOMI. No discharge.  Cardiovascular: RRR. No JVD. Respiratory: CTA bilaterally. Unlabored.  GI: He exhibits no distension. BS+. +Eakins pouch. GJ tube.  Musculoskeletal: He exhibits no edema. He exhibits no tenderness.  Neurological: He is alert.  Speech with dysarthria, improving Motor: 4+/5 throughout (unchanged) Skin: Skin is warm and dry. Abdominal wounds with dressing.  Psych: Normal mood and affect.   Assessment/Plan: 1. Functional deficits secondary to debility and encephalopathy which require 3+ hours per day of interdisciplinary therapy in a comprehensive inpatient rehab setting. Physiatrist is providing close team supervision and 24 hour management of active medical problems listed below. Physiatrist and rehab team continue to  assess barriers to discharge/monitor patient progress toward functional and medical goals.  Function:  Bathing Bathing position Bathing activity did not occur: Refused Position: Wheelchair/chair at sink  Bathing parts Body parts bathed by patient: Right arm, Left arm, Chest, Front perineal area, Buttocks, Right upper leg, Left upper leg, Left lower leg, Right lower leg, Back Body parts bathed by helper: Buttocks, Front perineal area  Bathing assist Assist Level: Set up, Supervision or verbal cues Assistive Device Comment: long-handled sponge Set up : To obtain items  Upper Body Dressing/Undressing Upper body dressing Upper body dressing/undressing activity did not occur: Safety/medical concerns What is the patient wearing?: Pull over shirt/dress     Pull over shirt/dress - Perfomed by patient: Thread/unthread right sleeve, Thread/unthread left sleeve, Put head through opening, Pull shirt over trunk Pull over shirt/dress - Perfomed by helper: Pull shirt over trunk        Upper body assist Assist Level: Set up   Set up : To obtain clothing/put away  Lower Body Dressing/Undressing Lower body dressing Lower body dressing/undressing activity did not occur: Safety/medical concerns What is the patient wearing?: Pants, Socks, Shoes     Pants- Performed by patient: Thread/unthread right pants leg, Thread/unthread left pants leg, Pull pants up/down Pants- Performed by helper: Pull pants up/down, Thread/unthread right pants leg, Thread/unthread left pants leg Non-skid slipper socks- Performed by patient: Don/doff right sock Non-skid slipper  socks- Performed by helper: Don/doff left sock, Don/doff right sock Socks - Performed by patient: Don/doff right sock, Don/doff left sock Socks - Performed by helper: Don/doff right sock, Don/doff left sock Shoes - Performed by patient: Don/doff right shoe, Don/doff left shoe, Fasten left, Fasten right Shoes - Performed by helper: Don/doff right shoe,  Don/doff left shoe, Fasten right, Fasten left       TED Hose - Performed by helper: Don/doff right TED hose, Don/doff left TED hose  Lower body assist Assist for lower body dressing: 2 Helpers      Toileting Toileting Toileting activity did not occur: No continent bowel/bladder event Toileting steps completed by patient: Adjust clothing prior to toileting, Performs perineal hygiene Toileting steps completed by helper: Adjust clothing after toileting, Adjust clothing prior to toileting Toileting Assistive Devices: Grab bar or rail, Other (comment) (RW)  Toileting assist Assist level: Touching or steadying assistance (Pt.75%)   Transfers Chair/bed transfer   Chair/bed transfer method: Stand pivot, Ambulatory Chair/bed transfer assist level: Supervision or verbal cues Chair/bed transfer assistive device: Armrests, Patent attorney     Max distance: 225 ft Assist level: Supervision or verbal cues   Wheelchair   Type: Manual Max wheelchair distance: 80 ft Assist Level: Supervision or verbal cues  Cognition Comprehension Comprehension assist level: Understands basic 90% of the time/cues < 10% of the time  Expression Expression assist level: Expresses basic 75 - 89% of the time/requires cueing 10 - 24% of the time. Needs helper to occlude trach/needs to repeat words.  Social Interaction Social Interaction assist level: Interacts appropriately 90% of the time - Needs monitoring or encouragement for participation or interaction.  Problem Solving Problem solving assist level: Solves basic 75 - 89% of the time/requires cueing 10 - 24% of the time  Memory Memory assist level: Recognizes or recalls 75 - 89% of the time/requires cueing 10 - 24% of the time    Medical Problem List and Plan: 1. Debility and encephalopathy secondary to small bowel obstruction and associated complications   D/c today, extensive time has been spent educating family, arranging for appropriate  meds, nutrition, etc, at discharge. Family is aware of responsibilities and has chosen to take patient home.   Will see patient within 7 days for transitional care management  Cont NPO   No crushed meds through feeding tube  Enterocutaneous fistula, ?limiting tube feedings to 30 cc/h, however, Dr. Michaell Cowing placed order for 16 hours/day @35ccs , cont TPN 2. DVT Prophylaxis/Anticoagulation: Pharmaceutical: Lovenox  3. Pain Management: IV morphine every 4 hours prn severe pain  4. Mood: team to provide ego support. LCSW to follow for evaluation and support.   Prozac started 1/18 5. Neuropsych: This patient is not fully capable of making decisions on his own behalf.  6. Skin/Wound Care: Continue air mattress overlay. Continue large Eakin's pouch to manage wound drainage. WOC for management. Encourage side lying to help manage MASD.  7. Malnutrition/Fluids/Electrolytes/Nutrition: continue TPN.   Albumin 1.7 on 2/12  Swallow eval completed with recs for free water protocol. 8. Abdominal abscess: Leucocytosis resolved. Completed rocephin 1/17 9. Anemia of chronic illness:   Hb 10.7 on 2/12  Cont to monitor 10. Electrolyte abnormality/Acute renal insufficiency: Managed by TNA   Mag stable 1.7 2/12 11. Encephalopathy: Improving.  12. Hyperglycemia  Overall controlled  13 Sleep disturbance  Has tried Haldol without benefit, ativan causes hallucinations  Continue Xanax elixer,  Improved 14. Mild hyponatremia:   Na+ 134 on 2/12  15. Secondary HTN  Overalll controlled   Cont to monitor  LOS (Days) 33 A FACE TO FACE EVALUATION WAS PERFORMED  Sriman Tally Karis Jubanil Mariah Harn 03/30/2016 9:28 AM

## 2016-03-30 NOTE — Discharge Instructions (Signed)
Inpatient Rehab Discharge Instructions  [name] Discharge date and time:  03/30/16  Activities/Precautions/ Functional Status: Activity: no lifting, driving, or strenuous exercise for till cleared by MD Diet: Nothing by mouth. Tube feeds at 40 cc/hr --Dr. Michaell CowingGross to advise on adjusting rate.  Wound Care: Eakin's pouch to abdomen.Nurse to change weekly. Cleanse around GJ tube with soap and water. Pat dry. Gastric tube plugged. [name]. Apply desitin to sacral area.   Functional status:  ___ No restrictions     ___ Walk up steps independently _X__ 24/7 supervision/assistance   ___ Walk up steps with assistance ___ Intermittent supervision/assistance  ___ Bathe/dress independently ___ Walk with walker     _X__ Bathe/dress with assistance ___ Walk Independently    ___ Shower independently ___ Walk with assistance    ___ Shower with assistance _X__ No alcohol     ___ Return to work/school ________  Special Instructions: 1. Administer dulcolax suppository or enema if no BM in 48 hours.  2. Needs to sit out of bed for 1-2 hours three times a day.  3. Contact MD if you develop any problems with your incision/wound--redness, swelling, increase in pain, drainage or if you develop fever or chills. OR if you develop nausea and/or  vomiting.  4. Check for residual via G- tube twice a day. Contact MD if residuals are high.    COMMUNITY REFERRALS UPON DISCHARGE:    Home Health:   PT,OT,SP,RN, AIDE, SW  Agency:[facility] Phone:[phone]   Date of last service:03/30/2016  Medical Equipment/Items Ordered:HOSPITAL BED, TRANSPORT CHAIR, 3IN1, JEVITY TUBE FEEDINGS, IV POLE, KANGAROO PUMP,YONKERS SUCTION MACHINE  Agency/Supplier:[facility]   [phone]  Other:PRIVATE DUTY AGENCY LIST  GENERAL COMMUNITY RESOURCES FOR PATIENT/FAMILY: Support Groups:[facility]PPORT [facility] SECOND Tuesday OF EACH MONTH @ 12:30-2:00 PM AT [facility] [address] [facility] [phone]-RESOURCES   My questions have been answered and I understand these instructions. I will adhere to these goals and the provided educational materials after my discharge from the hospital.  Patient/Caregiver Signature _______________________________ Date __________  Clinician Signature _______________________________________ Date __________  Please bring this form and your medication list with you to all your follow-up doctor's appointments.

## 2016-03-30 NOTE — Consult Note (Signed)
WOC Nurse wound follow up Wound type: surgical with EC fistula Measurement: 3cm x 1.0cm x 0.1 Wound bed: fistula appears to be stomatizing, hypergranulation tissue present, no depth.  One additional area of hypergranulation tissue along healed incision distally Drainage (amount, consistency, odor) scant, brown/yellow Periwound: intact  Dressing procedure/placement/frequency: Patient's daughter videoed procedure for use at home for pouch change Removed current pouch and cleansed site and surrounding skin Verified current Eakin pattern still appropriate for site New small Eakin cut using current pattern.  2" barrier ring cut in half and each half used along edge of fistula site 1 strip of Eakin pouch material used over the healed distal portion of the incision New small Eakin applied and patient placed hand over the pouch to allow for body heat to warm and seal pouch.  Window framed pouch with tape. Small silicone foam placed over the strip of eakin material distally that is not covered by the pouch. Pouch change has been each Thursday.   Patient's daughter at the bedside for teaching today on pouch change.    WOC Nurse team will follow along with you for weekly wound assessments and management of fistula pouching.   Please notify me of any acute changes in the wounds or any new areas of concerns Sadiyah Kangas [facility] MSN, RN,CWOCN, CNS [phone]

## 2016-03-30 NOTE — Plan of Care (Signed)
Problem: RH Bed Mobility Goal: LTG Patient will perform bed mobility with assist (PT) LTG: Patient will perform bed mobility with assistance, with/without cues (PT).  Outcome: Not Met (add Reason) Patient requires up to min A for bed mobility using hospital bed functions.   Problem: RH Car Transfers Goal: LTG Patient will perform car transfers with assist (PT) LTG: Patient will perform car transfers with assistance (PT).   Outcome: Not Met (add Reason) Patient requires up to mod A for sit > stand from car at sedan height due to weakness and fatigue.

## 2016-03-30 NOTE — Progress Notes (Signed)
Social Work  Discharge Note  The overall goal for the admission was met for:   Discharge location: Yes-HOME WITH WIFE AND DAUGHTER WHO CAN PROVIDE 24 HR CARE  Length of Stay: No  Discharge activity level: Yes-SUPERVISION-MIN ASSIST LEVEL  Home/community participation: Yes  Services provided included: MD, RD, PT, OT, SLP, RN, CM, Pharmacy, Neuropsych and SW  Financial Services: Private Insurance: [facility]  Follow-up services arranged: Home Health: [place]-PT,OT,SP,RN,AIDE,SW, DME: [facility]-HOSPITAL BED, TRANSPORT CHAIR, JEVITY TUBE FEEDS, YONKERS SUNCTION MACHINE, KANAGROO PUMP TPN, 3IN1,  and Patient/Family request agency HH: FOLLOWING WHILE ON REHAB, DME: PREF USED BEFORE  Comments (or additional information):WIFE AND DAUGHTER DECIDED TO TAKE PT HOME, AFTER MUCH TRAINING, BOTH FEEL COMFORTABLE WITH THIS PLAN AND THE SUPPORT OF [place]. GIVEN PRIVATE DUTY LIST AND THEY WILL PURSUE ON THEIR OWN. PT DID WELL AND REACHED SUPERVISION-MIN ASSIST LEVEL GOALS AND WANTED TO BE HOME. WOC-MELODY DID TEACHING ON EAKINS POUCH CHANGE, AWARE THIS IS DONE ONCE PER WEEK-Thursday.   Patient/Family verbalized understanding of follow-up arrangements: Yes  Individual responsible for coordination of the follow-up plan: WIFE & [name]-DAUGHTER  Confirmed correct DME delivered: Elease Hashimoto 03/30/2016    Elease Hashimoto

## 2016-03-31 ENCOUNTER — Telehealth: Payer: Self-pay | Admitting: *Deleted

## 2016-03-31 ENCOUNTER — Telehealth: Payer: Self-pay

## 2016-03-31 NOTE — Telephone Encounter (Signed)
Transitional Care call  Patient name: [name], [name] DOB__26-Nov-1942_ 1. Are you/is patient experiencing any problems since coming home?  Yes, bladder inconsistently empting a. Are there any questions regarding any aspect of care?  no 2. Are there any questions regarding medications administration/dosing?  no a. Are meds being taken as prescribed?  yes b. "Patient should review meds with caller to confirm" no 3. Have there been any falls? no 4. Has Home Health been to the house and/or have they contacted you?  yes a. If not, have you tried to contact them?  no b. Can we help you contact them? no 5. Are bowels and bladder emptying properly?  No, has been inconsistant a. Are there any unexpected incontinence issues?  yes b. If applicable, is patient following bowel/bladder programs? no 6. Any fevers, problems with breathing, unexpected pain?  no 7. Are there any skin problems or new areas of breakdown? no 8. Has the patient/family member arranged specialty MD follow up (ie cardiology/neurology/renal/surgical/etc.)?   yes a. Can we help arrange?  Yes in case need 9. Does the patient need any other services or support that we can help arrange? no 10. Are caregivers following through as expected in assisting the patient? yes 11. Has the patient quit smoking, drinking alcohol, or using drugs as recommended? no  Appointment time 1120am 04-07-2016 arrive time 1100 and who it is with here Dr Allena KatzPatel [address]

## 2016-03-31 NOTE — Telephone Encounter (Signed)
Prior authorization submitted and approved for lidocaine 5% patches #30

## 2016-03-31 NOTE — Telephone Encounter (Signed)
Prior Authorization submitted for alprazolam concentrate 1mg /mL and approved. Dr. Allena KatzPatel is not going to continue prescribing this medication.  He will encourage patient to work on refills through PCP

## 2016-04-01 ENCOUNTER — Telehealth: Payer: Self-pay

## 2016-04-01 NOTE — Telephone Encounter (Signed)
Rosanne AshingJim PT called requesting verbal orders for 2xwk for 2wks then 1xwk for 1wk for fall risk reduction along with functionality.

## 2016-04-01 NOTE — Telephone Encounter (Signed)
Patty RN from Home health care called, states needs orders for tube feeding, fistula care, TPN.  Was originally told that a Dr. Michaell CowingGross was to be completing the orders but was told that it should be the attending Dr. Allena KatzPatel

## 2016-04-03 NOTE — Progress Notes (Signed)
Patient discharged to home with daughter. Educated and daughter demonstrated how to administer medications via J tube. Educated daughter on skin care and Melody, WOC RN educated family on dressing change of Eakin pouch. Daughter able to verbalize understanding and demonstrated successfully how to manage Jtube and administer medications and water via J tube. Patient and daughter verbalized understanding of discharge instructions given by P. Love, PA.  [name], [name]

## 2016-04-07 ENCOUNTER — Encounter: Payer: Medicare Other | Attending: Physical Medicine & Rehabilitation | Admitting: Physical Medicine & Rehabilitation

## 2016-04-07 ENCOUNTER — Encounter: Payer: Self-pay | Admitting: Physical Medicine & Rehabilitation

## 2016-04-07 VITALS — BP 166/73 | HR 75 | Resp 16

## 2016-04-07 DIAGNOSIS — F329 Major depressive disorder, single episode, unspecified: Secondary | ICD-10-CM

## 2016-04-07 DIAGNOSIS — K3189 Other diseases of stomach and duodenum: Secondary | ICD-10-CM

## 2016-04-07 DIAGNOSIS — K9189 Other postprocedural complications and disorders of digestive system: Secondary | ICD-10-CM

## 2016-04-07 DIAGNOSIS — I10 Essential (primary) hypertension: Secondary | ICD-10-CM | POA: Diagnosis not present

## 2016-04-07 DIAGNOSIS — R1310 Dysphagia, unspecified: Secondary | ICD-10-CM | POA: Insufficient documentation

## 2016-04-07 DIAGNOSIS — K56609 Unspecified intestinal obstruction, unspecified as to partial versus complete obstruction: Secondary | ICD-10-CM | POA: Insufficient documentation

## 2016-04-07 DIAGNOSIS — M5136 Other intervertebral disc degeneration, lumbar region: Secondary | ICD-10-CM | POA: Diagnosis not present

## 2016-04-07 DIAGNOSIS — E785 Hyperlipidemia, unspecified: Secondary | ICD-10-CM | POA: Insufficient documentation

## 2016-04-07 DIAGNOSIS — I2510 Atherosclerotic heart disease of native coronary artery without angina pectoris: Secondary | ICD-10-CM | POA: Diagnosis not present

## 2016-04-07 DIAGNOSIS — K219 Gastro-esophageal reflux disease without esophagitis: Secondary | ICD-10-CM | POA: Diagnosis not present

## 2016-04-07 DIAGNOSIS — Z934 Other artificial openings of gastrointestinal tract status: Secondary | ICD-10-CM

## 2016-04-07 DIAGNOSIS — Z87891 Personal history of nicotine dependence: Secondary | ICD-10-CM | POA: Insufficient documentation

## 2016-04-07 DIAGNOSIS — K632 Fistula of intestine: Secondary | ICD-10-CM

## 2016-04-07 DIAGNOSIS — J449 Chronic obstructive pulmonary disease, unspecified: Secondary | ICD-10-CM | POA: Diagnosis not present

## 2016-04-07 DIAGNOSIS — M545 Low back pain: Secondary | ICD-10-CM | POA: Insufficient documentation

## 2016-04-07 DIAGNOSIS — K279 Peptic ulcer, site unspecified, unspecified as acute or chronic, without hemorrhage or perforation: Secondary | ICD-10-CM | POA: Diagnosis not present

## 2016-04-07 DIAGNOSIS — N400 Enlarged prostate without lower urinary tract symptoms: Secondary | ICD-10-CM | POA: Insufficient documentation

## 2016-04-07 DIAGNOSIS — F411 Generalized anxiety disorder: Secondary | ICD-10-CM

## 2016-04-07 DIAGNOSIS — Z789 Other specified health status: Secondary | ICD-10-CM

## 2016-04-07 DIAGNOSIS — E8809 Other disorders of plasma-protein metabolism, not elsewhere classified: Secondary | ICD-10-CM

## 2016-04-07 DIAGNOSIS — R490 Dysphonia: Secondary | ICD-10-CM | POA: Diagnosis not present

## 2016-04-07 DIAGNOSIS — G8929 Other chronic pain: Secondary | ICD-10-CM | POA: Insufficient documentation

## 2016-04-07 DIAGNOSIS — F418 Other specified anxiety disorders: Secondary | ICD-10-CM | POA: Diagnosis not present

## 2016-04-07 DIAGNOSIS — R1313 Dysphagia, pharyngeal phase: Secondary | ICD-10-CM

## 2016-04-07 DIAGNOSIS — R3911 Hesitancy of micturition: Secondary | ICD-10-CM | POA: Insufficient documentation

## 2016-04-07 DIAGNOSIS — R5381 Other malaise: Secondary | ICD-10-CM | POA: Diagnosis not present

## 2016-04-07 DIAGNOSIS — E43 Unspecified severe protein-calorie malnutrition: Secondary | ICD-10-CM

## 2016-04-07 DIAGNOSIS — G479 Sleep disorder, unspecified: Secondary | ICD-10-CM | POA: Diagnosis not present

## 2016-04-07 DIAGNOSIS — F32A Depression, unspecified: Secondary | ICD-10-CM

## 2016-04-07 DIAGNOSIS — E46 Unspecified protein-calorie malnutrition: Secondary | ICD-10-CM

## 2016-04-07 DIAGNOSIS — D638 Anemia in other chronic diseases classified elsewhere: Secondary | ICD-10-CM

## 2016-04-07 DIAGNOSIS — F419 Anxiety disorder, unspecified: Secondary | ICD-10-CM

## 2016-04-07 NOTE — Progress Notes (Signed)
Subjective:    Patient ID: [name], male    DOB: [birth year], 76 y.o.   MRN: [id number]  HPI  76 year old male with history of CAD, chronic back pain, COPD, GERD with dysphonia, PUD due to ulcer and abdominal pain presents for transitional care management after receiving CIR for debility related to SBO and enterocutaneous fistula.    Admit date: 02/26/2016 Discharge date: 03/30/2016  Presents with daughter and wife, who provide all of the history. At that times, pt was instructed to change the Eakin's pouch, which daughter changed yesterday. He is doing doing his HEP and staying out of bed for the majority of the day. He denies issues with tube feeds, TPN.  Pt has appointment with Surgery on Monday, PCP later today. He remains NPO.  He has increased TFs to 50cc/hr now.  He denies pain. He sleeps well, appears to be less reliant on medications. Currently cough with gagging appears to be main issues.  His BP is now elevated.   DME: Hospital bed, previously had other Therapies: 2/week for PT/OT/SLP. Mobility: Walker at all times.   Pain Inventory Average Pain 0 Pain Right Now 0 My pain is no pain  In the last 24 hours, has pain interfered with the following? General activity no pain Relation with others no pain Enjoyment of life no pain What TIME of day is your pain at its worst? no pain Sleep (in general) NA  Pain is worse with: no pain Pain improves with: no pain Relief from Meds: no pain  Mobility walk with assistance use a walker how many minutes can you walk? 5 ability to climb steps?  yes do you drive?  no use a wheelchair needs help with transfers  Function retired I need assistance with the following:  feeding, dressing, bathing, toileting, meal prep, household duties and shopping  Neuro/Psych bladder control problems bowel control problems weakness trouble walking dizziness confusion depression  Prior Studies Any changes since last visit?   no  Physicians involved in your care Primary care Dr [name] Surgeon Dr Star Age   Family History  Problem Relation Age of Onset  . Heart attack Mother    Social History   Social History  . Marital status: Married    Spouse name: N/A  . Number of children: N/A  . Years of education: N/A   Social History Main Topics  . Smoking status: Former Smoker    Packs/day: 2.00    Years: 20.00    Types: Cigarettes    Quit date: 02/15/1995  . Smokeless tobacco: Never Used  . Alcohol use No  . Drug use: No  . Sexual activity: No   Other Topics Concern  . None   Social History Narrative  . None   Past Surgical History:  Procedure Laterality Date  . CARDIAC CATHETERIZATION    . CHOLECYSTECTOMY    . ESOPHAGOGASTRODUODENOSCOPY N/A 12/01/2015   Procedure: ESOPHAGOGASTRODUODENOSCOPY (EGD) BALLOON DILATION OF DUODENAL STRICTURE;  Surgeon: Karie Soda, MD;  Location: [facility];  Service: General;  Laterality: N/A;  . ESOPHAGOGASTRODUODENOSCOPY N/A 12/14/2015   Procedure: ESOPHAGOGASTRODUODENOSCOPY (EGD);  Surgeon: Vida Rigger, MD;  Location: [facility];  Service: Endoscopy;  Laterality: N/A;  Patient has tracheostomy  . ESOPHAGOGASTRODUODENOSCOPY (EGD) WITH PROPOFOL N/A 07/01/2015   Procedure: ESOPHAGOGASTRODUODENOSCOPY (EGD) WITH PROPOFOL;  Surgeon: Willis Modena, MD;  Location: [facility];  Service: Endoscopy;  Laterality: N/A;  . ESOPHAGOGASTRODUODENOSCOPY (EGD) WITH PROPOFOL N/A 11/23/2015   Procedure: ESOPHAGOGASTRODUODENOSCOPY (EGD) WITH PROPOFOL;  Surgeon: Willis ModenaWilliam Outlaw, MD;  [facility];  Service: Endoscopy;  Laterality: N/A;  may need to intubate  . EYE SURGERY     growth on right eye removed   . IR GENERIC HISTORICAL  12/13/2015   IR REPLC DUODEN/JEJUNO TUBE PERCUT W/FLUORO 12/13/2015 Jolaine ClickArthur Hoss, MD [facility]  . IR GENERIC HISTORICAL  02/03/2016   IR REPLC GASTRO/COLONIC TUBE PERCUT W/FLUORO 02/03/2016 Berdine DanceMichael Shick, MD [facility]  . IR GENERIC  HISTORICAL  02/04/2016   IR GASTR TUBE CONVERT GASTR-JEJ PER W/FL MOD SED 02/04/2016 Malachy MoanHeath McCullough, MD [facility]  . IR GENERIC HISTORICAL  02/25/2016   IR SINUS/FIST TUBE CHK-NON GI 02/25/2016 Irish LackGlenn Yamagata, MD [facility]  . IR GENERIC HISTORICAL  03/18/2016   IR CM INJ ANY COLONIC TUBE W/FLUORO 03/18/2016 Simonne ComeJohn Watts, MD [facility]  . IR GENERIC HISTORICAL  03/18/2016   IR GJ TUBE CHANGE 03/18/2016 Simonne ComeJohn Watts, MD [facility]  . IR GENERIC HISTORICAL  03/22/2016   IR CM INJ ANY COLONIC TUBE W/FLUORO 03/22/2016 Simonne ComeJohn Watts, MD [facility]  . LAPAROSCOPIC GASTROSTOMY N/A 12/01/2015   Procedure: LAPAROSCOPIC PLACEMENT OF FEEDING JEJUNOSTOMY AND GASTROSTOMY TUBE;  Surgeon: Karie SodaSteven Gross, MD;  Location: [facility];  Service: General;  Laterality: N/A;  . LAPAROSCOPY N/A 12/03/2015   Procedure: LAPAROSCOPY DIAGNOSTIC, OMENTOPEXY, JEJUNOSTOMY, WASH OUT;  Surgeon: Karie SodaSteven Gross, MD;  Location: [facility];  Service: General;  Laterality: N/A;  . LAPAROTOMY N/A 02/06/2016   Procedure: EXPLORATORY LAPAROTOMY, LYSIS OF ADHESIONS, SEGMENTAL SMALL BOWEL RESECTION, GASTROJEJUNOSTOMY;  Surgeon: Chevis PrettyPaul Toth III, MD;  Location: [facility];  Service: General;  Laterality: N/A;  . STENT TO HEART     2 1997 AND 1 IN 1996  . TONSILLECTOMY    . XI ROBOTIC VAGOTOMY AND ANTRECTOMY N/A 09/25/2015   Procedure: XI ROBOTIC ANTERIOR AND POSTERIOR VAGOTOMY, BILROTH I  ANASTOMOSIS DOR FUNDIPLICATION, OMENTOPEXY  UPPER ENDOSCOPY;  Surgeon: Karie SodaSteven Gross, MD;  Location: [facility];  Service: General;  Laterality: N/A;   Past Medical History:  Diagnosis Date  . Aspiration pneumonia (HCC) 01/17/2016  . Bilateral dry eyes   . Bile peritonitis due to gastric tube dislodgement 12/03/2015  . BPH (benign prostatic hyperplasia)   . Central pterygium of right eye   . Chronic respiratory failure with hypoxia (HCC)   . Coronary artery disease 1997, 2009   bare mental stent right coronart artery, occlusive followed [place] Dr. Garn[place]ank [place]  .  Degenerative disc disease    LOWER BACK  . Dysphonia   . ED (erectile dysfunction)   . Gastric outlet obstruction 11/24/2015  . Gastroesophageal reflux disease   . Hyperlipidemia   . Hyperopia of both eyes with astigmatism and presbyopia   . Hypertension   . Ischemic right index finger at tip 12/09/2015  . Nuclear senile cataract    Dr. Bernadette HoitHayden [facility] in [place]  . Peptic ulcer   . Pneumatosis of intestines 12/11/2015  . Pneumonia    HISTORY OF IN CHILDHOOD  . Posterior vitreous detachment 10/30/2012  . Pyloric stricture 11/24/2015  . Salzmann's nodular dystrophy of left eye   . SBO (small bowel obstruction) 01/20/2016  . Urinary hesitancy   . Wears glasses    BP (!) 166/73   Pulse 75   Resp 16   SpO2 90%   Opioid Risk Score:   Fall Risk Score:  `1  Depression screen PHQ 2/9  Depression screen PHQ 2/9 04/07/2016  Decreased Interest 3  Down, Depressed, Hopeless 1  PHQ - 2 Score 4  Altered sleeping 3  Tired, decreased energy 3  Change in appetite 0  Feeling bad or failure about yourself  0  Trouble concentrating 3  Suicidal thoughts 0  PHQ-9 Score 13  Difficult doing work/chores Not difficult at all    Review of Systems  HENT: Negative.   Eyes: Negative.   Respiratory: Negative.   Cardiovascular: Negative.   Gastrointestinal: Positive for abdominal distention and abdominal pain.  Endocrine: Negative.   Genitourinary: Negative.   Musculoskeletal: Positive for gait problem.  Skin: Negative.   Allergic/Immunologic: Negative.   Neurological: Positive for dizziness and weakness.  Hematological: Negative.   Psychiatric/Behavioral: Positive for confusion. The patient is nervous/anxious.   All other systems reviewed and are negative.      Objective:   Physical Exam Constitutional: NAD. Well-developed. Frail.  HENT: Normocephalic and atraumatic.  Eyes: EOMI. No discharge.  Cardiovascular: RRR. No JVD. Respiratory: CTA bilaterally. Unlabored.  GI:  He exhibits no distension. BS+. +Eakins pouch. GJ tube.  Musculoskeletal: +diffuse edema. He exhibits no tenderness.  Neurological: He is alert.  Speech with dysarthria, improving Motor: 4+/5 throughout (stable) Skin: Skin is warm and dry. Abdominal wounds with dressing.  Psych: Normal mood and affect.     Assessment & Plan:  76 year old male with history of CAD, chronic back pain, COPD, GERD with dysphonia, PUD due to ulcer and abdominal pain presents for transitional care management after receiving CIR for debility related to SBO and enterocolonic fistula.    1. Debility secondary to small bowel obstruction and associated complications, including enterocutaneous fistula  Cont NPO             No crushed meds through feeding tube             Enterocutaneous fistula, TFs increased to 50c 2 days ago, pt with gagging and abdominal discomfort at times, may need to decrease dose  Cont TPN, pt with increase in edema, may need to adjust protein  2. Mood  Improved, cont prozac started 1/18, family would like to continue  3.. Malnutrition/Fluids/Electrolytes/Nutrition:   Continue TPN/TFs.   Latest albumin on record 2.0 on 2/12  4. Anemia of chronic illness:   Follow up with PCP, appointment today  5. Sleep disturbance  Improved with Xanax 0.25qhs  6. Essential HTN  Was taken off meds, but BPs running high again, follow up with PCP later this afternoon for resuming meds  7. Dysphagia  NPO  Cont SLP, advance diet as tolerated  Referrals reviewed Meds reviewed All questions answered

## 2016-04-08 ENCOUNTER — Encounter: Payer: Medicare Other | Admitting: Physical Medicine & Rehabilitation

## 2016-04-11 ENCOUNTER — Other Ambulatory Visit (HOSPITAL_COMMUNITY): Payer: Self-pay | Admitting: Surgery

## 2016-04-11 DIAGNOSIS — Z434 Encounter for attention to other artificial openings of digestive tract: Principal | ICD-10-CM

## 2016-04-11 DIAGNOSIS — T85528A Displacement of other gastrointestinal prosthetic devices, implants and grafts, initial encounter: Secondary | ICD-10-CM

## 2016-04-13 ENCOUNTER — Other Ambulatory Visit (HOSPITAL_COMMUNITY): Payer: Self-pay | Admitting: Surgery

## 2016-04-13 ENCOUNTER — Encounter (HOSPITAL_COMMUNITY): Payer: Self-pay | Admitting: Diagnostic Radiology

## 2016-04-13 ENCOUNTER — Ambulatory Visit (HOSPITAL_COMMUNITY)
Admission: RE | Admit: 2016-04-13 | Discharge: 2016-04-13 | Disposition: A | Discharge status: Home / Self Care | Payer: Medicare Other | Source: Ambulatory Visit | Attending: Surgery | Admitting: Surgery

## 2016-04-13 DIAGNOSIS — Z434 Encounter for attention to other artificial openings of digestive tract: Principal | ICD-10-CM

## 2016-04-13 DIAGNOSIS — K9429 Other complications of gastrostomy: Secondary | ICD-10-CM | POA: Insufficient documentation

## 2016-04-13 DIAGNOSIS — J9600 Acute respiratory failure, unspecified whether with hypoxia or hypercapnia: Secondary | ICD-10-CM | POA: Diagnosis not present

## 2016-04-13 DIAGNOSIS — I110 Hypertensive heart disease with heart failure: Secondary | ICD-10-CM | POA: Diagnosis not present

## 2016-04-13 DIAGNOSIS — T85528A Displacement of other gastrointestinal prosthetic devices, implants and grafts, initial encounter: Secondary | ICD-10-CM

## 2016-04-13 HISTORY — PX: IR GENERIC HISTORICAL: IMG1180011

## 2016-04-13 MED ORDER — LIDOCAINE VISCOUS 2 % MT SOLN
OROMUCOSAL | Status: AC
  Filled 2016-04-13: qty 15

## 2016-04-13 MED ORDER — IOPAMIDOL (ISOVUE-300) INJECTION 61%
INTRAVENOUS | Status: AC
  Administered 2016-04-13: 20 mL
  Filled 2016-04-13: qty 50

## 2016-04-13 NOTE — Procedures (Signed)
Jejunal lumen of GJ was occluded.  The occlusion was easily cleared with flushing.  Tube injection confirmed good placement in small bowel.  Patient's heart rate appeared to be irregular and patient was placed on monitor.  HR 70-80s with PACs.  Discussed with PCP, Dr. Kirby FunkJohn [name].  He will have home health get new labs today.  Patient in no distress.  Discussed drain management with wife and daughter.

## 2016-04-14 ENCOUNTER — Other Ambulatory Visit: Payer: Self-pay | Admitting: Physical Medicine and Rehabilitation

## 2016-04-14 ENCOUNTER — Telehealth: Payer: Self-pay | Admitting: Physical Medicine & Rehabilitation

## 2016-04-14 NOTE — Telephone Encounter (Signed)
Allen DerryAllison Mallory ST with [facility] called to let us know patient's blood pressure was elevated.  Blood pressure was 190/60, no other symptoms.  I told her that she would need to call the primary doctor because we don't handle blood pressure issues.  She was okay with that, but if you need to call her back, please call her at [phone].

## 2016-04-15 ENCOUNTER — Observation Stay (HOSPITAL_BASED_OUTPATIENT_CLINIC_OR_DEPARTMENT_OTHER): Payer: Medicare Other

## 2016-04-15 ENCOUNTER — Emergency Department (HOSPITAL_COMMUNITY): Payer: Medicare Other

## 2016-04-15 ENCOUNTER — Encounter (HOSPITAL_COMMUNITY): Payer: Self-pay

## 2016-04-15 ENCOUNTER — Observation Stay (HOSPITAL_COMMUNITY): Payer: Medicare Other

## 2016-04-15 ENCOUNTER — Inpatient Hospital Stay (HOSPITAL_COMMUNITY)
Admission: EM | Admit: 2016-04-15 | Discharge: 2016-04-22 | DRG: 291 | Disposition: A | Discharge status: Home Health | Payer: Medicare Other | Attending: Internal Medicine | Admitting: Internal Medicine

## 2016-04-15 DIAGNOSIS — E785 Hyperlipidemia, unspecified: Secondary | ICD-10-CM | POA: Diagnosis not present

## 2016-04-15 DIAGNOSIS — I2510 Atherosclerotic heart disease of native coronary artery without angina pectoris: Secondary | ICD-10-CM | POA: Diagnosis present

## 2016-04-15 DIAGNOSIS — J9601 Acute respiratory failure with hypoxia: Secondary | ICD-10-CM | POA: Diagnosis present

## 2016-04-15 DIAGNOSIS — E8809 Other disorders of plasma-protein metabolism, not elsewhere classified: Secondary | ICD-10-CM

## 2016-04-15 DIAGNOSIS — Z934 Other artificial openings of gastrointestinal tract status: Secondary | ICD-10-CM

## 2016-04-15 DIAGNOSIS — Z79899 Other long term (current) drug therapy: Secondary | ICD-10-CM

## 2016-04-15 DIAGNOSIS — I10 Essential (primary) hypertension: Secondary | ICD-10-CM | POA: Diagnosis not present

## 2016-04-15 DIAGNOSIS — R778 Other specified abnormalities of plasma proteins: Secondary | ICD-10-CM | POA: Diagnosis present

## 2016-04-15 DIAGNOSIS — R7889 Finding of other specified substances, not normally found in blood: Secondary | ICD-10-CM | POA: Diagnosis not present

## 2016-04-15 DIAGNOSIS — J9600 Acute respiratory failure, unspecified whether with hypoxia or hypercapnia: Secondary | ICD-10-CM | POA: Diagnosis not present

## 2016-04-15 DIAGNOSIS — D649 Anemia, unspecified: Secondary | ICD-10-CM | POA: Diagnosis present

## 2016-04-15 DIAGNOSIS — R627 Adult failure to thrive: Secondary | ICD-10-CM | POA: Diagnosis present

## 2016-04-15 DIAGNOSIS — I5033 Acute on chronic diastolic (congestive) heart failure: Secondary | ICD-10-CM | POA: Diagnosis not present

## 2016-04-15 DIAGNOSIS — R1313 Dysphagia, pharyngeal phase: Secondary | ICD-10-CM | POA: Diagnosis present

## 2016-04-15 DIAGNOSIS — F411 Generalized anxiety disorder: Secondary | ICD-10-CM | POA: Diagnosis present

## 2016-04-15 DIAGNOSIS — I5020 Unspecified systolic (congestive) heart failure: Secondary | ICD-10-CM

## 2016-04-15 DIAGNOSIS — I248 Other forms of acute ischemic heart disease: Secondary | ICD-10-CM | POA: Diagnosis present

## 2016-04-15 DIAGNOSIS — J690 Pneumonitis due to inhalation of food and vomit: Secondary | ICD-10-CM | POA: Diagnosis present

## 2016-04-15 DIAGNOSIS — E784 Other hyperlipidemia: Secondary | ICD-10-CM

## 2016-04-15 DIAGNOSIS — F419 Anxiety disorder, unspecified: Secondary | ICD-10-CM

## 2016-04-15 DIAGNOSIS — J9811 Atelectasis: Secondary | ICD-10-CM | POA: Diagnosis present

## 2016-04-15 DIAGNOSIS — Z87891 Personal history of nicotine dependence: Secondary | ICD-10-CM

## 2016-04-15 DIAGNOSIS — E43 Unspecified severe protein-calorie malnutrition: Secondary | ICD-10-CM | POA: Diagnosis present

## 2016-04-15 DIAGNOSIS — E870 Hyperosmolality and hypernatremia: Secondary | ICD-10-CM | POA: Diagnosis present

## 2016-04-15 DIAGNOSIS — K219 Gastro-esophageal reflux disease without esophagitis: Secondary | ICD-10-CM | POA: Diagnosis present

## 2016-04-15 DIAGNOSIS — Z720 Tobacco use: Secondary | ICD-10-CM | POA: Diagnosis present

## 2016-04-15 DIAGNOSIS — R0602 Shortness of breath: Secondary | ICD-10-CM

## 2016-04-15 DIAGNOSIS — F329 Major depressive disorder, single episode, unspecified: Secondary | ICD-10-CM | POA: Diagnosis present

## 2016-04-15 DIAGNOSIS — I4729 Other ventricular tachycardia: Secondary | ICD-10-CM

## 2016-04-15 DIAGNOSIS — I472 Ventricular tachycardia: Secondary | ICD-10-CM | POA: Diagnosis present

## 2016-04-15 DIAGNOSIS — R5381 Other malaise: Secondary | ICD-10-CM | POA: Diagnosis present

## 2016-04-15 DIAGNOSIS — J9621 Acute and chronic respiratory failure with hypoxia: Secondary | ICD-10-CM | POA: Diagnosis present

## 2016-04-15 DIAGNOSIS — J9690 Respiratory failure, unspecified, unspecified whether with hypoxia or hypercapnia: Secondary | ICD-10-CM | POA: Diagnosis present

## 2016-04-15 DIAGNOSIS — K30 Functional dyspepsia: Secondary | ICD-10-CM

## 2016-04-15 DIAGNOSIS — Z888 Allergy status to other drugs, medicaments and biological substances status: Secondary | ICD-10-CM

## 2016-04-15 DIAGNOSIS — K311 Adult hypertrophic pyloric stenosis: Secondary | ICD-10-CM | POA: Diagnosis present

## 2016-04-15 DIAGNOSIS — I110 Hypertensive heart disease with heart failure: Principal | ICD-10-CM | POA: Diagnosis present

## 2016-04-15 DIAGNOSIS — I5022 Chronic systolic (congestive) heart failure: Secondary | ICD-10-CM

## 2016-04-15 DIAGNOSIS — Z6826 Body mass index (BMI) 26.0-26.9, adult: Secondary | ICD-10-CM

## 2016-04-15 DIAGNOSIS — E876 Hypokalemia: Secondary | ICD-10-CM | POA: Diagnosis present

## 2016-04-15 DIAGNOSIS — Z959 Presence of cardiac and vascular implant and graft, unspecified: Secondary | ICD-10-CM

## 2016-04-15 DIAGNOSIS — F0390 Unspecified dementia without behavioral disturbance: Secondary | ICD-10-CM | POA: Diagnosis present

## 2016-04-15 DIAGNOSIS — F32A Depression, unspecified: Secondary | ICD-10-CM | POA: Diagnosis present

## 2016-04-15 DIAGNOSIS — R7989 Other specified abnormal findings of blood chemistry: Secondary | ICD-10-CM | POA: Diagnosis present

## 2016-04-15 HISTORY — DX: Fistula of intestine: K63.2

## 2016-04-15 HISTORY — DX: Adult hypertrophic pyloric stenosis: K31.1

## 2016-04-15 LAB — CBC WITH DIFFERENTIAL/PLATELET
BASOS ABS: 0 10*3/uL (ref 0.0–0.1)
Basophils Relative: 0 %
EOS ABS: 0.1 10*3/uL (ref 0.0–0.7)
EOS PCT: 1 %
HCT: 32.4 % — ABNORMAL LOW (ref 39.0–52.0)
Hemoglobin: 10.2 g/dL — ABNORMAL LOW (ref 13.0–17.0)
LYMPHS ABS: 2 10*3/uL (ref 0.7–4.0)
LYMPHS PCT: 27 %
MCH: 29.7 pg (ref 26.0–34.0)
MCHC: 31.5 g/dL (ref 30.0–36.0)
MCV: 94.2 fL (ref 78.0–100.0)
MONO ABS: 0.6 10*3/uL (ref 0.1–1.0)
Monocytes Relative: 8 %
Neutro Abs: 4.8 10*3/uL (ref 1.7–7.7)
Neutrophils Relative %: 64 %
PLATELETS: 276 10*3/uL (ref 150–400)
RBC: 3.44 MIL/uL — AB (ref 4.22–5.81)
RDW: 15.8 % — AB (ref 11.5–15.5)
WBC: 7.5 10*3/uL (ref 4.0–10.5)

## 2016-04-15 LAB — BASIC METABOLIC PANEL
ANION GAP: 10 (ref 5–15)
BUN: 22 mg/dL — ABNORMAL HIGH (ref 6–20)
CALCIUM: 8.5 mg/dL — AB (ref 8.9–10.3)
CO2: 28 mmol/L (ref 22–32)
CREATININE: 0.79 mg/dL (ref 0.61–1.24)
Chloride: 110 mmol/L (ref 101–111)
GFR calc Af Amer: >60 mL/min (ref >60)
GLUCOSE: 108 mg/dL — AB (ref 65–99)
Potassium: 3 mmol/L — ABNORMAL LOW (ref 3.5–5.1)
Sodium: 148 mmol/L — ABNORMAL HIGH (ref 135–145)

## 2016-04-15 LAB — BASIC METABOLIC PANEL WITH GFR
Anion gap: 6 (ref 5–15)
Anion gap: 8 (ref 5–15)
BUN: 23 mg/dL — ABNORMAL HIGH (ref 6–20)
BUN: 23 mg/dL — ABNORMAL HIGH (ref 6–20)
CO2: 28 mmol/L (ref 22–32)
CO2: 29 mmol/L (ref 22–32)
Calcium: 8.3 mg/dL — ABNORMAL LOW (ref 8.9–10.3)
Calcium: 8.6 mg/dL — ABNORMAL LOW (ref 8.9–10.3)
Chloride: 114 mmol/L — ABNORMAL HIGH (ref 101–111)
Chloride: 111 mmol/L (ref 101–111)
Creatinine, Ser: 0.69 mg/dL (ref 0.61–1.24)
Creatinine, Ser: 0.81 mg/dL (ref 0.61–1.24)
GFR calc Af Amer: >60 mL/min
GFR calc Af Amer: >60 mL/min
GFR calc non Af Amer: >60 mL/min
GFR calc non Af Amer: >60 mL/min
Glucose, Bld: 95 mg/dL (ref 65–99)
Glucose, Bld: 112 mg/dL — ABNORMAL HIGH (ref 65–99)
Potassium: 2.9 mmol/L — ABNORMAL LOW (ref 3.5–5.1)
Potassium: 3.3 mmol/L — ABNORMAL LOW (ref 3.5–5.1)
Sodium: 148 mmol/L — ABNORMAL HIGH (ref 135–145)
Sodium: 148 mmol/L — ABNORMAL HIGH (ref 135–145)

## 2016-04-15 LAB — COMPREHENSIVE METABOLIC PANEL
ALT: 22 U/L (ref 17–63)
ANION GAP: 9 (ref 5–15)
AST: 23 U/L (ref 15–41)
Albumin: 2 g/dL — ABNORMAL LOW (ref 3.5–5.0)
Alkaline Phosphatase: 113 U/L (ref 38–126)
BUN: 25 mg/dL — ABNORMAL HIGH (ref 6–20)
CHLORIDE: 115 mmol/L — AB (ref 101–111)
CO2: 26 mmol/L (ref 22–32)
Calcium: 8.4 mg/dL — ABNORMAL LOW (ref 8.9–10.3)
Creatinine, Ser: 0.75 mg/dL (ref 0.61–1.24)
GFR calc Af Amer: >60 mL/min (ref >60)
GFR calc non Af Amer: >60 mL/min (ref >60)
Glucose, Bld: 106 mg/dL — ABNORMAL HIGH (ref 65–99)
POTASSIUM: 3 mmol/L — AB (ref 3.5–5.1)
SODIUM: 150 mmol/L — AB (ref 135–145)
Total Bilirubin: 0.3 mg/dL (ref 0.3–1.2)
Total Protein: 6.2 g/dL — ABNORMAL LOW (ref 6.5–8.1)

## 2016-04-15 LAB — PHOSPHORUS: PHOSPHORUS: 3.4 mg/dL (ref 2.5–4.6)

## 2016-04-15 LAB — TROPONIN I
Troponin I: 0.07 ng/mL (ref <0.03)
Troponin I: 0.06 ng/mL
Troponin I: 0.06 ng/mL (ref <0.03)

## 2016-04-15 LAB — ECHOCARDIOGRAM COMPLETE
Height: 74 in
Weight: 2608 [oz_av]

## 2016-04-15 LAB — MAGNESIUM: MAGNESIUM: 1.7 mg/dL (ref 1.7–2.4)

## 2016-04-15 LAB — BRAIN NATRIURETIC PEPTIDE: B NATRIURETIC PEPTIDE 5: 1832.6 pg/mL — AB (ref 0.0–100.0)

## 2016-04-15 MED ORDER — OMEPRAZOLE 2 MG/ML ORAL SUSPENSION
40.0000 mg | Freq: Every day | ORAL | Status: DC
  Administered 2016-04-15 – 2016-04-22 (×8): 40 mg
  Filled 2016-04-15 (×9): qty 20

## 2016-04-15 MED ORDER — NYSTATIN 100000 UNIT/ML MT SUSP
30.0000 mL | Freq: Four times a day (QID) | OROMUCOSAL | Status: DC
  Administered 2016-04-15: 3000000 [IU]
  Filled 2016-04-15 (×3): qty 30

## 2016-04-15 MED ORDER — LIDOCAINE 5 % EX PTCH: 1.0000 | MEDICATED_PATCH | CUTANEOUS | Status: DC

## 2016-04-15 MED ORDER — IPRATROPIUM-ALBUTEROL 0.5-2.5 (3) MG/3ML IN SOLN
3.0000 mL | Freq: Four times a day (QID) | RESPIRATORY_TRACT | Status: DC
  Administered 2016-04-15 – 2016-04-16 (×5): 3 mL via RESPIRATORY_TRACT
  Filled 2016-04-15 (×5): qty 3

## 2016-04-15 MED ORDER — CHLORHEXIDINE GLUCONATE 0.12 % MT SOLN
15.0000 mL | Freq: Two times a day (BID) | OROMUCOSAL | Status: DC
  Administered 2016-04-15: 15 mL via OROMUCOSAL
  Filled 2016-04-15: qty 15

## 2016-04-15 MED ORDER — FLUOXETINE HCL 20 MG/5ML PO SOLN
20.0000 mg | Freq: Every day | ORAL | Status: DC
  Administered 2016-04-15 – 2016-04-22 (×8): 20 mg
  Filled 2016-04-15 (×8): qty 5

## 2016-04-15 MED ORDER — CYCLOSPORINE 0.05 % OP EMUL
1.0000 [drp] | Freq: Two times a day (BID) | OPHTHALMIC | Status: DC
  Administered 2016-04-15 – 2016-04-22 (×14): 1 [drp] via OPHTHALMIC
  Filled 2016-04-15 (×16): qty 1

## 2016-04-15 MED ORDER — GUAIFENESIN 100 MG/5ML PO SOLN
10.0000 mL | Freq: Three times a day (TID) | ORAL | Status: AC
  Administered 2016-04-15 – 2016-04-22 (×13): 200 mg via ORAL
  Filled 2016-04-15 (×4): qty 10
  Filled 2016-04-15 (×2): qty 5
  Filled 2016-04-15 (×5): qty 10
  Filled 2016-04-15 (×2): qty 5
  Filled 2016-04-15 (×2): qty 10
  Filled 2016-04-15: qty 5
  Filled 2016-04-15 (×2): qty 10

## 2016-04-15 MED ORDER — DEXTROSE 5 % IV SOLN
500.0000 mg | INTRAVENOUS | Status: DC
  Administered 2016-04-15: 500 mg via INTRAVENOUS
  Filled 2016-04-15: qty 500

## 2016-04-15 MED ORDER — ORAL CARE MOUTH RINSE
15.0000 mL | Freq: Two times a day (BID) | OROMUCOSAL | Status: DC
  Administered 2016-04-15: 15 mL via OROMUCOSAL

## 2016-04-15 MED ORDER — LIDOCAINE 5 % EX PTCH
1.0000 | MEDICATED_PATCH | CUTANEOUS | Status: DC
  Administered 2016-04-15 – 2016-04-21 (×7): 1 via TRANSDERMAL
  Filled 2016-04-15 (×7): qty 1

## 2016-04-15 MED ORDER — ALPRAZOLAM 1 MG/ML PO CONC
0.5000 mg | Freq: Every evening | ORAL | Status: DC | PRN
  Administered 2016-04-15 – 2016-04-21 (×7): 0.5 mg
  Filled 2016-04-15 (×17): qty 0.5

## 2016-04-15 MED ORDER — PERFLUTREN LIPID MICROSPHERE
1.0000 mL | INTRAVENOUS | Status: AC | PRN
  Administered 2016-04-15: 2 mL via INTRAVENOUS
  Filled 2016-04-15: qty 10

## 2016-04-15 MED ORDER — ASPIRIN 325 MG PO TABS
325.0000 mg | ORAL_TABLET | Freq: Every day | ORAL | Status: DC
  Administered 2016-04-16 – 2016-04-22 (×7): 325 mg via ORAL
  Filled 2016-04-15 (×7): qty 1

## 2016-04-15 MED ORDER — NYSTATIN 100000 UNIT/ML MT SUSP
5.0000 mL | Freq: Four times a day (QID) | OROMUCOSAL | Status: DC
  Administered 2016-04-15 – 2016-04-22 (×25): 500000 [IU]
  Filled 2016-04-15 (×25): qty 5

## 2016-04-15 MED ORDER — ADULT MULTIVITAMIN LIQUID CH
15.0000 mL | Freq: Every day | ORAL | Status: DC
  Administered 2016-04-16: 15 mL
  Filled 2016-04-15: qty 15

## 2016-04-15 MED ORDER — DEXTROSE 5 % IV SOLN
1.0000 g | Freq: Once | INTRAVENOUS | Status: AC
  Administered 2016-04-15: 1 g via INTRAVENOUS
  Filled 2016-04-15: qty 10

## 2016-04-15 MED ORDER — ZINC OXIDE 40 % EX OINT
TOPICAL_OINTMENT | Freq: Two times a day (BID) | CUTANEOUS | Status: DC
  Administered 2016-04-15 – 2016-04-17 (×4): via TOPICAL
  Administered 2016-04-17: 1 via TOPICAL
  Administered 2016-04-18 – 2016-04-19 (×3): via TOPICAL
  Administered 2016-04-19: 1 via TOPICAL
  Administered 2016-04-20 – 2016-04-21 (×4): via TOPICAL
  Administered 2016-04-22: 1 via TOPICAL
  Filled 2016-04-15: qty 114

## 2016-04-15 MED ORDER — POLYVINYL ALCOHOL 1.4 % OP SOLN
1.0000 [drp] | Freq: Three times a day (TID) | OPHTHALMIC | Status: DC | PRN
  Administered 2016-04-22: 1 [drp] via OPHTHALMIC
  Filled 2016-04-15: qty 15

## 2016-04-15 MED ORDER — POTASSIUM CHLORIDE 20 MEQ/15ML (10%) PO SOLN
40.0000 meq | Freq: Two times a day (BID) | ORAL | Status: AC
  Administered 2016-04-15 (×2): 40 meq via ORAL
  Filled 2016-04-15 (×2): qty 30

## 2016-04-15 MED ORDER — MENTHOL 3 MG MT LOZG
1.0000 | LOZENGE | OROMUCOSAL | Status: DC | PRN
  Filled 2016-04-15: qty 9

## 2016-04-15 MED ORDER — ALBUTEROL SULFATE (2.5 MG/3ML) 0.083% IN NEBU: 2.5000 mg | INHALATION_SOLUTION | RESPIRATORY_TRACT | Status: DC | PRN

## 2016-04-15 MED ORDER — JEVITY 1.2 CAL PO LIQD
1000.0000 mL | ORAL | Status: DC
  Administered 2016-04-15 – 2016-04-17 (×3): 1000 mL
  Filled 2016-04-15 (×6): qty 1000

## 2016-04-15 MED ORDER — SODIUM CHLORIDE 0.9% FLUSH
10.0000 mL | INTRAVENOUS | Status: DC | PRN
  Administered 2016-04-21 – 2016-04-22 (×2): 10 mL
  Filled 2016-04-15 (×2): qty 40

## 2016-04-15 MED ORDER — SODIUM CHLORIDE 0.9% FLUSH
3.0000 mL | Freq: Two times a day (BID) | INTRAVENOUS | Status: DC
  Administered 2016-04-15 – 2016-04-22 (×9): 3 mL via INTRAVENOUS

## 2016-04-15 MED ORDER — BLISTEX MEDICATED EX OINT
1.0000 "application " | TOPICAL_OINTMENT | Freq: Two times a day (BID) | CUTANEOUS | Status: DC
  Administered 2016-04-15 – 2016-04-22 (×14): 1 via TOPICAL
  Filled 2016-04-15 (×3): qty 6.3

## 2016-04-15 MED ORDER — POTASSIUM CHLORIDE 20 MEQ/15ML (10%) PO SOLN
40.0000 meq | Freq: Every day | ORAL | Status: AC
  Administered 2016-04-16: 40 meq via ORAL
  Filled 2016-04-15: qty 30

## 2016-04-15 MED ORDER — SODIUM CHLORIDE 0.9 % IV SOLN: INTRAVENOUS | Status: DC

## 2016-04-15 MED ORDER — FUROSEMIDE 10 MG/ML IJ SOLN
40.0000 mg | Freq: Two times a day (BID) | INTRAMUSCULAR | Status: DC
  Administered 2016-04-15 – 2016-04-16 (×2): 40 mg via INTRAVENOUS
  Filled 2016-04-15 (×2): qty 4

## 2016-04-15 MED ORDER — PROCHLORPERAZINE 25 MG RE SUPP
25.0000 mg | Freq: Two times a day (BID) | RECTAL | Status: DC | PRN
  Filled 2016-04-15: qty 1

## 2016-04-15 MED ORDER — NITROGLYCERIN 0.4 MG SL SUBL: 0.4000 mg | SUBLINGUAL_TABLET | SUBLINGUAL | Status: DC | PRN

## 2016-04-15 NOTE — Progress Notes (Signed)
  Echocardiogram 2D Echocardiogram with definity has been performed.  Leta JunglingCooper, Leslye Puccini M 04/15/2016, 3:01 PM

## 2016-04-15 NOTE — ED Notes (Signed)
Pt going to Echo then to inpt bed.

## 2016-04-15 NOTE — Consult Note (Signed)
The patient has been seen in conjunction with [name], PAC. All aspects of care have been considered and discussed. The patient has been personally interviewed, examined, and all clinical data has been reviewed.   The stent was is well known to me from prior management of underlying coronary disease. Sad 2 year of all the significant medical problems that he has had over the past 12 months.  Presents now with dyspnea and lower extremity edema. There is a component of orthopnea. No chest discomfort.  Cardiac exam reveals no gallop or murmur. Mildly elevated neck veins. Bilateral 2+ peripheral edema in both lower extremities. EF is preserved by echo, 55-60%. Serum albumin is 2.0 due to malnutrition. Kidney function is normal. No liver enzyme elevation is noted. BNP 1832. Potassium 2.9. Chest x-ray with bilateral effusions. Possible pulmonary congestion.  IMPRESSION: Overall, there is pulmonary congestion and peripheral edema likely multifactorial, related to low oncotic pressure (albumin 2.0) and a component of acute on chronic diastolic heart failure.  RECOMMENDATION: Add IV furosemide; replete potassium; improve nutrition if possible to help support oncotic pressure. We will follow. Follow urine output, kidney function, and potassium.   CARDIOLOGY CONSULT NOTE   Patient ID: [name] MRN: [id number] DOB/AGE: 08/31/1940 76 y.o.  Admit date: 04/15/2016  Primary Physician   Lillia Mountain, MD Primary Cardiologist   Dr. Katrinka Blazing Reason for Consultation   CH[facility] Requesting Physician  Dr. Melynda Ripple  HPI: [name] is a 76 y.o. male with a history of CAD (RCA bare-metal stent 1997 and LAD DES 2000), HTN, HLD, chronic respiratory failure, recent complex GI procedure and discharged from inpatient rehab 03/30/16 presents for dyspnea x 4 days.  He has long-standing history of coronary artery disease. First coronary event occurred in 1997. Last seen by Dr. Katrinka Blazing 04/2013. Last Myoview  04/2013 normal.   Hx of PUD due to ulcer and abdominal pain who underwent distal gastrectomy with vagotomy 09/2015 due to acquired stricture. Post op course complicated by gastric ileus requiring jejunostomy, encephalopathy and FTT. He developed gastric leak due to jejunal disruption after pulling out his tube and required wash out with omentopexy 10/19, VDRF --tracheostomy, TPN as well as tube feeds and discharged to SNF for care and monitoring. He was readmitted on 01/19/16 due to abdominal pain with severe vomiting and unable to tolerate oral intake. He was found to have partial SBO treated conservatively without improvement and taken to OR on 12/23 for exp lap with lysis of adhesions, segmental SB resection and gastrojejunostomy. Later developed LLQ abscess with EC fistula requiring drain placement.  Admitted to inpatient rehab 02/26/16-03/29/16.  This patient has a dysphagia and on feeding tube. Came to ER this morning with 3-4 days history of shortness of breath. Admits to having orthopnea and lower extremity edema. No chest pain, palpitation or syncope. Noted blood pressure of 180/90 at home.  His bp was 165/87 at presentation. BNP 1832. Troponin 0.07-->0.06. K of 2.9. EKG showed sinus rhythm with PACS. Echo this admission showed normal EF.   CXR showed: IMPRESSION: Bilateral effusions with dependent atelectasis/lower lung pneumonia. Possible fluid overload/ CHF as well.  On Abx and supplement K.    Past Medical History:  Diagnosis Date  . Aspiration pneumonia (HCC) 01/17/2016  . Bilateral dry eyes   . Bile peritonitis due to gastric tube dislodgement 12/03/2015  . BPH (benign prostatic hyperplasia)   . Central pterygium of right eye   . Chronic respiratory failure with hypoxia (HCC)   . Coronary  artery disease 1997, 2009   bare mental stent right coronart artery, occlusive followed by Dr. Garnette Scheuermann in [place]  . Degenerative disc disease    LOWER BACK  . Dysphonia   . ED (erectile  dysfunction)   . Gastric outlet obstruction 11/24/2015  . Gastroesophageal reflux disease   . Hyperlipidemia   . Hyperopia of both eyes with astigmatism and presbyopia   . Hypertension   . Ischemic right index finger at tip 12/09/2015  . Nuclear senile cataract    Dr. [facility] in [place]  . Peptic ulcer   . Pneumatosis of intestines 12/11/2015  . Pneumonia    HISTORY OF IN CHILDHOOD  . Posterior vitreous detachment 10/30/2012  . Pyloric stricture 11/24/2015  . Salzmann's nodular dystrophy of left eye   . SBO (small bowel obstruction) 01/20/2016  . Urinary hesitancy   . Wears glasses      Past Surgical History:  Procedure Laterality Date  . CARDIAC CATHETERIZATION    . CHOLECYSTECTOMY    . ESOPHAGOGASTRODUODENOSCOPY N/A 12/01/2015   Procedure: ESOPHAGOGASTRODUODENOSCOPY (EGD) BALLOON DILATION OF DUODENAL STRICTURE;  Surgeon: Karie Soda, MD;  Location: [facility];  Service: General;  Laterality: N/A;  . ESOPHAGOGASTRODUODENOSCOPY N/A 12/14/2015   Procedure: ESOPHAGOGASTRODUODENOSCOPY (EGD);  Surgeon: Vida Rigger, MD;  Location: [facility];  Service: Endoscopy;  Laterality: N/A;  Patient has tracheostomy  . ESOPHAGOGASTRODUODENOSCOPY (EGD) WITH PROPOFOL N/A 07/01/2015   Procedure: ESOPHAGOGASTRODUODENOSCOPY (EGD) WITH PROPOFOL;  Surgeon: Willis Modena, MD;  Location: [facility];  Service: Endoscopy;  Laterality: N/A;  . ESOPHAGOGASTRODUODENOSCOPY (EGD) WITH PROPOFOL N/A 11/23/2015   Procedure: ESOPHAGOGASTRODUODENOSCOPY (EGD) WITH PROPOFOL;  Surgeon: Willis Modena, MD;  Location: [facility];  Service: Endoscopy;  Laterality: N/A;  may need to intubate  . EYE SURGERY     growth on right eye removed   . IR GENERIC HISTORICAL  12/13/2015   IR REPLC DUODEN/JEJUNO TUBE PERCUT W/FLUORO 12/13/2015 [name], MD [facility]  . IR GENERIC HISTORICAL  02/03/2016   IR REPLC GASTRO/COLONIC TUBE PERCUT W/FLUORO 02/03/2016 Berdine Dance, MD [facility]  . IR GENERIC  HISTORICAL  02/04/2016   IR GASTR TUBE CONVERT GASTR-JEJ PER W/FL MOD SED 02/04/2016 Malachy Moan, MD [facility]  . IR GENERIC HISTORICAL  02/25/2016   IR SINUS/FIST TUBE CHK-NON GI 02/25/2016 Irish Lack, MD [facility]  . IR GENERIC HISTORICAL  03/18/2016   IR CM INJ ANY COLONIC TUBE W/FLUORO 03/18/2016 Simonne Come, MD [facility]  . IR GENERIC HISTORICAL  03/18/2016   IR GJ TUBE CHANGE 03/18/2016 Simonne Come, MD [facility]  . IR GENERIC HISTORICAL  03/22/2016   IR CM INJ ANY COLONIC TUBE W/FLUORO 03/22/2016 Simonne Come, MD [facility]  . IR GENERIC HISTORICAL  04/13/2016   IR CM INJ ANY COLONIC TUBE W/FLUORO 04/13/2016 Richarda Overlie, MD [facility]  . LAPAROSCOPIC GASTROSTOMY N/A 12/01/2015   Procedure: LAPAROSCOPIC PLACEMENT OF FEEDING JEJUNOSTOMY AND GASTROSTOMY TUBE;  Surgeon: Karie Soda, MD;  Location: [facility];  Service: General;  Laterality: N/A;  . LAPAROSCOPY N/A 12/03/2015   Procedure: LAPAROSCOPY DIAGNOSTIC, OMENTOPEXY, JEJUNOSTOMY, WASH OUT;  Surgeon: Karie Soda, MD;  Location: [facility];  Service: General;  Laterality: N/A;  . LAPAROTOMY N/A 02/06/2016   Procedure: EXPLORATORY LAPAROTOMY, LYSIS OF ADHESIONS, SEGMENTAL SMALL BOWEL RESECTION, GASTROJEJUNOSTOMY;  Surgeon: Chevis Pretty III, MD;  Location: [facility];  Service: General;  Laterality: N/A;  . STENT TO HEART     2 1997 AND 1 IN 1996  . TONSILLECTOMY    .  XI ROBOTIC VAGOTOMY AND ANTRECTOMY N/A 09/25/2015   Procedure: XI ROBOTIC ANTERIOR AND POSTERIOR VAGOTOMY, BILROTH I  ANASTOMOSIS DOR FUNDIPLICATION, OMENTOPEXY  UPPER ENDOSCOPY;  Surgeon: Karie Soda, MD;  Location: [facility];  Service: General;  Laterality: N/A;    Allergies  Allergen Reactions  . Flomax [Tamsulosin Hcl] Other (See Comments)    Leg weakness  . Lisinopril Cough  . Lorazepam Other (See Comments)    "i don't like it"  Prefers Xanax or valium  . Uroxatral [Alfuzosin Hcl Er] Other (See Comments)    Leg weakness    I have reviewed the patient's current  medications . Alprazolam  0.5 mg Per Tube QHS,MR X 1  . [START ON 04/16/2016] aspirin  325 mg Oral Daily  . azithromycin  500 mg Intravenous Q24H  . cefTRIAXone (ROCEPHIN)  IV  1 g Intravenous Once  . chlorhexidine  15 mL Mouth Rinse BID  . cycloSPORINE  1 drop Both Eyes BID  . feeding supplement (JEVITY 1.2 CAL)  1,000 mL Per Tube Q24H  . FLUoxetine  20 mg Per Tube Daily  . guaiFENesin  10 mL Oral Q8H  . ipratropium-albuterol  3 mL Nebulization Q6H  . lidocaine  1 patch Transdermal Q24H  . lip balm  1 application Topical BID  . liver oil-zinc oxide   Topical BID  . mouth rinse  15 mL Mouth Rinse q12n4p  . [START ON 04/16/2016] multivitamin  15 mL Per Tube Daily  . nystatin  30 mL Per Tube QID  . omeprazole  40 mg Per Tube Daily  . potassium chloride  40 mEq Oral BID  . sodium chloride flush  3 mL Intravenous Q12H   . sodium chloride     albuterol, menthol-cetylpyridinium, nitroGLYCERIN, perflutren lipid microspheres (DEFINITY) IV suspension, polyvinyl alcohol, prochlorperazine, sodium chloride flush  Prior to Admission medications   Medication Sig Start Date End Date Taking? Authorizing Provider  Alprazolam 1 MG/ML CONC Take 0.5 mLs (0.5 mg total) by mouth at bedtime and may repeat dose one time if needed. Patient taking differently: Place 0.5 mg into feeding tube at bedtime and may repeat dose one time if needed.  03/28/16  Yes Evlyn Kanner Love, PA-C  chlorhexidine (PERIDEX) 0.12 % solution 15 mLs by Mouth Rinse route 2 (two) times daily. 03/28/16  Yes Evlyn Kanner Love, PA-C  cycloSPORINE (RESTASIS) 0.05 % ophthalmic emulsion Place 1 drop into both eyes 2 (two) times daily. 02/26/16  Yes Jerre Simon, PA  erythromycin ethylsuccinate (EES) 200 MG/5ML suspension Place 10 mLs (400 mg total) into feeding tube every 6 (six) hours. 03/28/16  Yes Evlyn Kanner Love, PA-C  FLUoxetine (PROZAC) 20 MG/5ML solution GIVE INTO FEEDING TUBE ONCE DAILY. 04/14/16  Yes Ankit Karis Juba, MD  lidocaine (LIDODERM) 5  % Apply to back at 7 pm and remove at 7 am daily 03/28/16  Yes Evlyn Kanner Love, PA-C  lip balm (CARMEX) ointment Apply 1 application topically 2 (two) times daily. 02/26/16  Yes Jerre Simon, PA  liver oil-zinc oxide (DESITIN) 40 % ointment Apply topically 2 (two) times daily. 02/26/16  Yes Jerre Simon, PA  magic mouthwash SOLN Take 15 mLs by mouth 4 (four) times daily as needed for mouth pain (sore throat). 03/28/16  Yes Evlyn Kanner Love, PA-C  menthol-cetylpyridinium (CEPACOL) 3 MG lozenge Take 1 lozenge (3 mg total) by mouth as needed for sore throat. 02/26/16  Yes Jerre Simon, PA  Menthol-Methyl Salicylate (MUSCLE RUB) 10-15 % CREA  Apply 1 application topically 3 (three) times daily before meals. 03/28/16  Yes Evlyn KannerPamela S Love, PA-C  mouth rinse LIQD solution 15 mLs by Mouth Rinse route 2 times daily at 12 noon and 4 pm. 02/26/16  Yes Jerre SimonJessica L Focht, PA  Multiple Vitamin (MULTIVITAMIN) LIQD Place 15 mLs into feeding tube daily. 03/31/16  Yes Evlyn KannerPamela S Love, PA-C  nitroGLYCERIN (NITROSTAT) 0.4 MG SL tablet Place 1 tablet (0.4 mg total) under the tongue every 5 (five) minutes as needed for chest pain. 02/26/16  Yes Jerre SimonJessica L Focht, PA  Nutritional Supplements (FEEDING SUPPLEMENT, JEVITY 1.2 CAL,) LIQD Place 1,000 mLs into feeding tube daily. 03/30/16  Yes Evlyn KannerPamela S Love, PA-C  nystatin (MYCOSTATIN) 100000 UNIT/ML suspension Place 30 mLs into feeding tube 4 (four) times daily. 04/13/16  Yes Historical Provider, MD  omeprazole (PRILOSEC) 2 mg/mL SUSP Place 20 mLs (40 mg total) into feeding tube daily. 03/29/16  Yes Evlyn KannerPamela S Love, PA-C  polyvinyl alcohol (LIQUIFILM TEARS) 1.4 % ophthalmic solution Place 1 drop into both eyes every 8 (eight) hours as needed (dry). 02/26/16  Yes Jerre SimonJessica L Focht, PA  prochlorperazine (COMPAZINE) 25 MG suppository Place 1 suppository (25 mg total) rectally every 12 (twelve) hours as needed for nausea or vomiting. 03/30/16  Yes Evlyn KannerPamela S Love, PA-C  sodium chloride flush (NS) 0.9 % SOLN  10-40 mLs by Intracatheter route as needed (flush). 02/26/16  Yes Jerre SimonJessica L Focht, PA     Social History   Social History  . Marital status: Married    Spouse name: N/A  . Number of children: N/A  . Years of education: N/A   Occupational History  . Not on file.   Social History Main Topics  . Smoking status: Former Smoker    Packs/day: 2.00    Years: 20.00    Types: Cigarettes    Quit date: 02/15/1995  . Smokeless tobacco: Never Used  . Alcohol use No  . Drug use: No  . Sexual activity: No   Other Topics Concern  . Not on file   Social History Narrative  . No narrative on file    Family Status  Relation Status  . Mother Deceased  . Father Deceased   Family History  Problem Relation Age of Onset  . Heart attack Mother      ROS:  Full 14 point review of systems complete and found to be negative unless listed above.  Physical Exam: Blood pressure (!) 164/69, pulse 68, temperature 98.4 F (36.9 C), temperature source Oral, resp. rate 20, height 6' (1.829 m), weight 192 lb 6.4 oz (87.3 kg), SpO2 98 %.  General: Chronically ill appearing  male in no acute distress Head: Eyes PERRLA, No xanthomas. Normocephalic and atraumatic, oropharynx without edema or exudate.  Lungs: Resp regular and unlabored, diminished breath sound with rales Heart: RRR no s3, s4, or murmurs.   Neck: No carotid bruits. No lymphadenopathy. +  JVD. Abdomen: Bowel sounds present, abdomen soft and non-tender without masses or hernias noted. Msk:  No spine or cva tenderness. No weakness, no joint deformities or effusions. Extremities: No clubbing, cyanosis. 1+ Bl LE  edema. DP/PT/Radials 2+ and equal bilaterally. Neuro: Alert and oriented X 3. No focal deficits noted. Psych:  Good affect, responds appropriately Skin: No rashes or lesions noted.  Labs:   Lab Results  Component Value Date   WBC 7.5 04/15/2016   HGB 10.2 (L) 04/15/2016   HCT 32.4 (L) 04/15/2016   MCV 94.2 04/15/2016  PLT 276  04/15/2016   No results for input(s): INR in the last 72 hours.  Recent Labs Lab 04/15/16 1031 04/15/16 1400  NA 150* 148*  K 3.0* 2.9*  CL 115* 114*  CO2 26 28  BUN 25* 23*  CREATININE 0.75 0.69  CALCIUM 8.4* 8.3*  PROT 6.2*  --   BILITOT 0.3  --   ALKPHOS 113  --   ALT 22  --   AST 23  --   GLUCOSE 106* 95  ALBUMIN 2.0*  --    Magnesium  Date Value Ref Range Status  04/15/2016 1.7 1.7 - 2.4 mg/dL Final    Recent Labs  40/98/11 1031 04/15/16 1400  TROPONINI 0.07* 0.06*   No results for input(s): TROPIPOC in the last 72 hours. No results found for: PROBNP Lab Results  Component Value Date   TRIG 135 03/28/2016   No results found for: DDIMER Lipase  Date/Time Value Ref Range Status  01/20/2016 01:58 AM 55 (H) 11 - 51 U/L Final   No results found for: TSH, T4TOTAL, T3FREE, THYROIDAB Vitamin B-12  Date/Time Value Ref Range Status  01/21/2016 04:37 AM 1,224 (H) 180 - 914 pg/mL Final    Comment:    (NOTE) This assay is not validated for testing neonatal or myeloproliferative syndrome specimens for Vitamin B12 levels. Performed at [facility]    Folate  Date/Time Value Ref Range Status  01/21/2016 04:37 AM 12.0 >5.9 ng/mL Final    Comment:    Performed at [facility]   Ferritin  Date/Time Value Ref Range Status  01/21/2016 04:37 AM 194 24 - 336 ng/mL Final    Comment:    Performed at [facility]   TIBC  Date/Time Value Ref Range Status  01/22/2016 05:11 AM 120 (L) 250 - 450 ug/dL Final    Comment:    REPEATED TO VERIFY   Iron  Date/Time Value Ref Range Status  01/22/2016 05:11 AM 290 (H) 45 - 182 ug/dL Final    Comment:    REPEATED TO VERIFY   Retic Ct Pct  Date/Time Value Ref Range Status  01/21/2016 04:37 AM 3.9 (H) 0.4 - 3.1 % Final    Echo: 04/15/16 Study Conclusions  - Left ventricle: The cavity size was normal. Wall thickness was   increased in a pattern of mild LVH. Systolic function was normal.   The  estimated ejection fraction was in the range of 50% to 55%.   Diffuse hypokinesis. Doppler parameters are consistent with   abnormal left ventricular relaxation (grade 1 diastolic   dysfunction). - Aortic valve: There was mild regurgitation.  Impressions:  - Extremely limited; definity used; normal LV systolic function;   grade 1 diastolic function; mild AI.  Radiology:  Dg Chest 2 View  Result Date: 04/15/2016 CLINICAL DATA:  Shortness of breath over the last week. EXAM: CHEST  2 VIEW COMPARISON:  02/28/2016 FINDINGS: Left arm PICC tip in the SVC 3 cm above the right atrium. Bilateral pleural effusions with dependent pulmonary atelectasis and/or pneumonia. Possible fluid overload/ interstitial edema. No acute bone finding. IMPRESSION: Bilateral effusions with dependent atelectasis/lower lung pneumonia. Possible fluid overload/ CHF as well. Electronically Signed   By: Paulina Fusi M.D.   On: 04/15/2016 11:24   Ct Chest Wo Contrast  Result Date: 04/15/2016 CLINICAL DATA:  Acute respiratory failure, dyspnea for a few months EXAM: CT CHEST WITHOUT CONTRAST TECHNIQUE: Multidetector CT imaging of the chest was performed following the standard protocol  without IV contrast. COMPARISON:  CXR 04/15/2016 and 02/28/2016 FINDINGS: Cardiovascular: PICC line noted with tip in the distal SVC. Normal size cardiac chambers without pericardial effusion. Coronary arteriosclerosis along the LAD and circumflex. Aortic atherosclerosis. No aortic aneurysm. Mediastinum/Nodes: No supraclavicular, axillary nor mediastinal lymphadenopathy. Normal branch pattern of the great vessels with minimal atherosclerosis at the origins. No thyromegaly or nodules. The mainstem bronchi and trachea are unremarkable apart from some minimal mucous along the dependent aspect of the distal trachea and proximal left mainstem bronchus. Lungs/Pleura: Small to moderate bilateral pleural effusions left greater than right with compressive  atelectasis. Trace fluid extends into both major fissures. Centrilobular emphysema. Mild peribronchial thickening consistent with inflammation/ bronchitic change. Upper Abdomen: Partially visualized GJ tube. No acute abdominal abnormality to the extent visualized. Musculoskeletal: No chest wall mass or suspicious bone lesions identified. IMPRESSION: 1. Small to moderate bilateral pleural effusions with adjacent compressive atelectasis. 2. Mild bilateral peribronchial thickening suggesting inflammation/bronchitic change. 3. Coronary arteriosclerosis and aortic atherosclerosis. Electronically Signed   By: Tollie Eth M.D.   On: 04/15/2016 13:29    ASSESSMENT AND PLAN:     1. Acute diastolic heart failure - BNP 1832. Echo this admission showed normal LV systolic function; grade 1 diastolic function; mild AI. His albumin level in 2. Will start IV lasix 40mg  BID. Continue supplemental potassium. Strict I & O and daily weight.   2. Elevated troponin - Flat trend. Dement in setting of acute illness and CHF.   3. CAD s/p BMS to RCA in 1997 and DES to LAD in 2000 - No angina.  Last Myoview 04/2013 normal.   4. HTN - Elevated BP. IV PRN hydralazine.   5. Dysphagia -  On feeding tube  Patient will need DVT prophylaxis per primary team.  Principal Problem:   Respiratory failure, acute (HCC) Active Problems:   Essential hypertension, benign   Hyperlipidemia   Anxiety state   Tobacco abuse   Hypernatremia   Hypokalemia   Elevated troponin   Signed: Bhagat,Bhavinkumar, PA 04/15/2016, 4:02 PM Pager [phone]  Co-Sign MD

## 2016-04-15 NOTE — ED Notes (Signed)
Patient returned back from CT. 

## 2016-04-15 NOTE — Progress Notes (Signed)
K= 3.0. Had 11 runs of PVCs. MD paged.

## 2016-04-15 NOTE — Progress Notes (Signed)
[facility]  Mr. R[facility]wards [facility]. Pt was receiving Skilled home care services and home enteral therapy and TPN at home prior to this admission. [facility] team will follow Mr. Pollett while an inpatient to support transition back home when ordered.  See current home TPN formula below.     If patient discharges after hours, please call [phone].   [name] 04/15/2016, 1:24 PM

## 2016-04-15 NOTE — ED Provider Notes (Signed)
[facility]Y [facility]PT Provider Note   CSN: [id number] Arrival date & time: 04/15/16  0946     History   Chief Complaint Chief Complaint  Patient presents with  . Weakness  . Shortness of Breath    HPI [name] is a 76 y.o. male.  76 year old male presents for shortness of breath times several days. Patient has history of dysphagia and does use a feeding tube. He has a history of aspiration pneumonia. He only takes water by mouth at this time. Has had slight cough without fever or chills. No chest pain. His shortness of breath has been persistently worse and nothing seems to make it better. Denies any orthopnea. Has had some lower extremity edema. Patient has also had hypertension at home with a systolic of 190 and diastolic up to 90. She called his doctor was told to come here for further evaluation.      Past Medical History:  Diagnosis Date  . Aspiration pneumonia (HCC) 01/17/2016  . Bilateral dry eyes   . Bile peritonitis due to gastric tube dislodgement 12/03/2015  . BPH (benign prostatic hyperplasia)   . Central pterygium of right eye   . Chronic respiratory failure with hypoxia (HCC)   . Coronary artery disease 1997, 2009   bare mental stent right coronart artery, occlusive followed by Dr. Garnette Scheuermann in [place]  . Degenerative disc disease    LOWER BACK  . Dysphonia   . ED (erectile dysfunction)   . Gastric outlet obstruction 11/24/2015  . Gastroesophageal reflux disease   . Hyperlipidemia   . Hyperopia of both eyes with astigmatism and presbyopia   . Hypertension   . Ischemic right index finger at tip 12/09/2015  . Nuclear senile cataract    Dr. [facility] in [place]  . Peptic ulcer   . Pneumatosis of intestines 12/11/2015  . Pneumonia    HISTORY OF IN CHILDHOOD  . Posterior vitreous detachment 10/30/2012  . Pyloric stricture 11/24/2015  . Salzmann's nodular dystrophy of left eye   . SBO (small bowel obstruction) 01/20/2016  . Urinary  hesitancy   . Wears glasses     Patient Active Problem List   Diagnosis Date Noted  . Secondary hypertension   . Gastrojejunostomy tube dislodgement (HCC)   . Gastric dysmotility   . Hypoalbuminemia due to protein-calorie malnutrition (HCC)   . Gastrojejunostomy tube 02/06/2016 03/10/2016  . Jejunal anastomotic leak 03/10/2016  . Hyponatremia   . Anxiety and depression   . Sleep disturbance   . Orthostasis   . Leukocytosis   . Hyperglycemia   . FTT (failure to thrive) in adult   . 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 Anemia of chronic disease 01/31/2016  . Stage 2 skin ulcer of sacral region 01/31/2016  . Hypomagnesemia 01/21/2016  . Hypokalemia 01/21/2016  . Pressure injury of skin 01/20/2016  . Pleural effusion   . Gastroesophageal reflux disease 01/19/2016  . Insomnia 01/19/2016  . On total parenteral nutrition (TPN) 01/17/2016  . Metabolic acidosis 01/17/2016  . Skin ulcer of abdominal wall, limited to breakdown of skin (HCC) 01/17/2016  . Dysphagia 01/17/2016  . Debility 01/17/2016  . 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 Vitamin B12 deficiency 01/02/2016  . BPH (benign prostatic hyperplasia)   . Posterior vitreous detachment   . Salzmann's nodular dystrophy of left eye   . Hyperopia of both eyes with astigmatism and presbyopia   . Central pterygium of right eye   . Pressure ulcer  of buttock, right, unstageable (HCC) 12/30/2015  . Hypernatremia 12/28/2015  . Tobacco abuse 12/09/2015  . Gastritis 12/09/2015  . Acute respiratory failure with hypoxemia (HCC)   . Protein-calorie malnutrition, severe 12/04/2015  . Encephalopathy acute 12/03/2015  . Anxiety state 09/28/2015  . Hyperlipidemia   . 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 Coronary atherosclerosis of native coronary artery 04/24/2013  . Essential hypertension, benign 04/24/2013  . Other and unspecified  hyperlipidemia 04/24/2013  . Central pterygium 10/30/2012  . 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 Cataract, nuclear sclerotic senile 10/30/2012    Past Surgical History:  Procedure Laterality Date  . CARDIAC CATHETERIZATION    . CHOLECYSTECTOMY    . ESOPHAGOGASTRODUODENOSCOPY N/A 12/01/2015   Procedure: ESOPHAGOGASTRODUODENOSCOPY (EGD) BALLOON DILATION OF DUODENAL STRICTURE;  Surgeon: Karie Soda, MD;  Location: [facility];  Service: General;  Laterality: N/A;  . ESOPHAGOGASTRODUODENOSCOPY N/A 12/14/2015   Procedure: ESOPHAGOGASTRODUODENOSCOPY (EGD);  Surgeon: Vida Rigger, MD;  Location: [facility];  Service: Endoscopy;  Laterality: N/A;  Patient has tracheostomy  . ESOPHAGOGASTRODUODENOSCOPY (EGD) WITH PROPOFOL N/A 07/01/2015   Procedure: ESOPHAGOGASTRODUODENOSCOPY (EGD) WITH PROPOFOL;  Surgeon: Willis Modena, MD;  Location: [facility];  Service: Endoscopy;  Laterality: N/A;  . ESOPHAGOGASTRODUODENOSCOPY (EGD) WITH PROPOFOL N/A 11/23/2015   Procedure: ESOPHAGOGASTRODUODENOSCOPY (EGD) WITH PROPOFOL;  Surgeon: Willis Modena, MD;  Location: [facility];  Service: Endoscopy;  Laterality: N/A;  may need to intubate  . EYE SURGERY     growth on right eye removed   . IR GENERIC HISTORICAL  12/13/2015   IR REPLC DUODEN/JEJUNO TUBE PERCUT W/FLUORO 12/13/2015 [name], MD [facility]  . IR GENERIC HISTORICAL  02/03/2016   IR REPLC GASTRO/COLONIC TUBE PERCUT W/FLUORO 02/03/2016 Berdine Dance, MD [facility]  . IR GENERIC HISTORICAL  02/04/2016   IR GASTR TUBE CONVERT GASTR-JEJ PER W/FL MOD SED 02/04/2016 Malachy Moan, MD [facility]  . IR GENERIC HISTORICAL  02/25/2016   IR SINUS/FIST TUBE CHK-NON GI 02/25/2016 Irish Lack, MD [facility]  . IR GENERIC HISTORICAL  03/18/2016   IR CM INJ ANY COLONIC TUBE W/FLUORO 03/18/2016 Simonne Come, MD [facility]  . IR GENERIC HISTORICAL  03/18/2016   IR GJ TUBE CHANGE 03/18/2016 Simonne Come, MD [facility]  . IR  GENERIC HISTORICAL  03/22/2016   IR CM INJ ANY COLONIC TUBE W/FLUORO 03/22/2016 Simonne Come, MD [facility]  . IR GENERIC HISTORICAL  04/13/2016   IR CM INJ ANY COLONIC TUBE W/FLUORO 04/13/2016 Richarda Overlie, MD [facility]  . LAPAROSCOPIC GASTROSTOMY N/A 12/01/2015   Procedure: LAPAROSCOPIC PLACEMENT OF FEEDING JEJUNOSTOMY AND GASTROSTOMY TUBE;  Surgeon: Karie Soda, MD;  Location: [facility];  Service: General;  Laterality: N/A;  . LAPAROSCOPY N/A 12/03/2015   Procedure: LAPAROSCOPY DIAGNOSTIC, OMENTOPEXY, JEJUNOSTOMY, WASH OUT;  Surgeon: Karie Soda, MD;  Location: [facility];  Service: General;  Laterality: N/A;  . LAPAROTOMY N/A 02/06/2016   Procedure: EXPLORATORY LAPAROTOMY, LYSIS OF ADHESIONS, SEGMENTAL SMALL BOWEL RESECTION, GASTROJEJUNOSTOMY;  Surgeon: Chevis Pretty III, MD;  Location: [facility];  Service: General;  Laterality: N/A;  . STENT TO HEART     2 1997 AND 1 IN 1996  . TONSILLECTOMY    . XI ROBOTIC VAGOTOMY AND ANTRECTOMY N/A 09/25/2015   Procedure: XI ROBOTIC ANTERIOR AND POSTERIOR VAGOTOMY, BILROTH I  ANASTOMOSIS DOR FUNDIPLICATION, OMENTOPEXY  UPPER ENDOSCOPY;  Surgeon: Karie Soda, MD;  Location: [facility];  Service: General;  Laterality: N/A;  Home Medications    Prior to Admission medications   Medication Sig Start Date End Date Taking? Authorizing Provider  Alprazolam 1 MG/ML CONC Take 0.5 mLs (0.5 mg total) by mouth at bedtime and may repeat dose one time if needed. 03/28/16   Jacquelynn CreePamela S Love, PA-C  chlorhexidine (PERIDEX) 0.12 % solution 15 mLs by Mouth Rinse route 2 (two) times daily. 03/28/16   Jacquelynn CreePamela S Love, PA-C  cycloSPORINE (RESTASIS) 0.05 % ophthalmic emulsion Place 1 drop into both eyes 2 (two) times daily. 02/26/16   Jerre SimonJessica L Focht, PA  erythromycin ethylsuccinate (EES) 200 MG/5ML suspension Place 10 mLs (400 mg total) into feeding tube every 6 (six) hours. 03/28/16   Evlyn KannerPamela S Love, PA-C  FLUoxetine (PROZAC) 20 MG/5ML solution GIVE 5ML INTO FEEDING TUBE ONCE DAILY. 04/14/16    Ankit Karis JubaAnil [name], MD  lidocaine (LIDODERM) 5 % Apply to back at 7 pm and remove at 7 am daily 03/28/16   Evlyn KannerPamela S Love, PA-C  lip balm (CARMEX) ointment Apply 1 application topically 2 (two) times daily. 02/26/16   Jerre SimonJessica L Focht, PA  liver oil-zinc oxide (DESITIN) 40 % ointment Apply topically 2 (two) times daily. 02/26/16   Jerre SimonJessica L Focht, PA  magic mouthwash SOLN Take 15 mLs by mouth 4 (four) times daily as needed for mouth pain (sore throat). 03/28/16   Jacquelynn CreePamela S Love, PA-C  menthol-cetylpyridinium (CEPACOL) 3 MG lozenge Take 1 lozenge (3 mg total) by mouth as needed for sore throat. 02/26/16   Jerre SimonJessica L Focht, PA  Menthol-Methyl Salicylate (MUSCLE RUB) 10-15 % CREA Apply 1 application topically 3 (three) times daily before meals. 03/28/16   Jacquelynn CreePamela S Love, PA-C  mouth rinse LIQD solution 15 mLs by Mouth Rinse route 2 times daily at 12 noon and 4 pm. 02/26/16   Jerre SimonJessica L Focht, PA  Multiple Vitamin (MULTIVITAMIN) LIQD Place 15 mLs into feeding tube daily. 03/31/16   Jacquelynn CreePamela S Love, PA-C  nitroGLYCERIN (NITROSTAT) 0.4 MG SL tablet Place 1 tablet (0.4 mg total) under the tongue every 5 (five) minutes as needed for chest pain. 02/26/16   Jerre SimonJessica L Focht, PA  Nutritional Supplements (FEEDING SUPPLEMENT, JEVITY 1.2 CAL,) LIQD Place 1,000 mLs into feeding tube daily. 03/30/16   Jacquelynn CreePamela S Love, PA-C  omeprazole (PRILOSEC) 2 mg/mL SUSP Place 20 mLs (40 mg total) into feeding tube daily. 03/29/16   Jacquelynn CreePamela S Love, PA-C  polyvinyl alcohol (LIQUIFILM TEARS) 1.4 % ophthalmic solution Place 1 drop into both eyes every 8 (eight) hours as needed (dry). 02/26/16   Jerre SimonJessica L Focht, PA  prochlorperazine (COMPAZINE) 25 MG suppository Place 1 suppository (25 mg total) rectally every 12 (twelve) hours as needed for nausea or vomiting. 03/30/16   Evlyn KannerPamela S Love, PA-C  sodium chloride flush (NS) 0.9 % SOLN 10-40 mLs by Intracatheter route as needed (flush). 02/26/16   Jerre SimonJessica L Focht, PA    Family History Family History  Problem  Relation Age of Onset  . Heart attack Mother     Social History Social History  Substance Use Topics  . Smoking status: Former Smoker    Packs/day: 2.00    Years: 20.00    Types: Cigarettes    Quit date: 02/15/1995  . Smokeless tobacco: Never Used  . Alcohol use No     Allergies   Flomax [tamsulosin hcl]; Lisinopril; Lorazepam; and Uroxatral [alfuzosin hcl er]   Review of Systems Review of Systems  All other systems reviewed and are negative.    Physical  Exam Updated Vital Signs BP 165/87 (BP Location: Right Arm)   Pulse 79   Temp 97.6 F (36.4 C) (Oral)   Resp 17   Ht 6\' 2"  (1.88 m)   Wt 73.9 kg   SpO2 97%   BMI 20.93 kg/m   Physical Exam  Constitutional: He is oriented to person, place, and time. He appears well-developed and well-nourished.  Non-toxic appearance. No distress.  HENT:  Head: Normocephalic and atraumatic.  Eyes: Conjunctivae, EOM and lids are normal. Pupils are equal, round, and reactive to light.  Neck: Normal range of motion. Neck supple. No tracheal deviation present. No thyroid mass present.  Cardiovascular: Normal rate, regular rhythm and normal heart sounds.  Exam reveals no gallop.   No murmur heard. Pulmonary/Chest: Effort normal. No stridor. No respiratory distress. He has decreased breath sounds in the right lower field and the left lower field. He has no wheezes. He has no rhonchi. He has no rales.  Abdominal: Soft. Normal appearance and bowel sounds are normal. He exhibits no distension. There is no tenderness. There is no rigidity, no rebound, no guarding and no CVA tenderness.  Musculoskeletal: Normal range of motion. He exhibits no edema or tenderness.  Neurological: He is alert and oriented to person, place, and time. He displays atrophy. No cranial nerve deficit. GCS eye subscore is 4. GCS verbal subscore is 5. GCS motor subscore is 6.  Skin: Skin is warm and dry. No abrasion and no rash noted.  Psychiatric: He has a normal mood and  affect. His speech is normal and behavior is normal.  Nursing note and vitals reviewed.    ED Treatments / Results  Labs (all labs ordered are listed, but only abnormal results are displayed) Labs Reviewed  CBC WITH DIFFERENTIAL/PLATELET  COMPREHENSIVE METABOLIC PANEL  TROPONIN I  BRAIN NATRIURETIC PEPTIDE    EKG  EKG Interpretation None       Radiology No results found.  Procedures Procedures (including critical care time)  Medications Ordered in ED Medications  0.9 %  sodium chloride infusion (not administered)     Initial Impression / Assessment and Plan / ED Course  I have reviewed the triage vital signs and the nursing notes.  Pertinent labs & imaging results that were available during my care of the patient were reviewed by me and considered in my medical decision making (see chart for details).     Patient has evidence of CHF here as well as hypernatremia. Also has mild hypokalemia. Patient's troponin is elevated as well 2. Discuss with hospitalist will admit the patient.  Final Clinical Impressions(s) / ED Diagnoses   Final diagnoses:  SOB (shortness of breath)    New Prescriptions New Prescriptions   No medications on file     Lorre Nick, MD 04/15/16 1211

## 2016-04-15 NOTE — Progress Notes (Signed)
New pt admission from ED. Pt brought to the floor in stable condition. Vitals taken. Initial Assessment done. Pt has PEG to L upper quadrant abdomen, Pt has PICC to L arm, pt has healing wound to mid abdomen covered in guaze with paper tape. Skin witnessed by Leotis ShamesLauren RN.  All immediate pertinent needs to patient addressed. Patient Guide given to patient. Important safety instructions relating to hospitalization reviewed with patient. Patient verbalized understanding. Will continue to monitor pt.  Jilda PandaBethany Toia Micale RN

## 2016-04-15 NOTE — H&P (Signed)
[facility] History and Physical  MASUD HOLUB ZOX:[id number] DOB: 09-17-1940 DOA: 04/15/2016  Referring physician: PCP: Lillia Mountain, MD   Chief Complaint: "I had trouble breathing."  HPI: GOLDMAN BIRCHALL is a 76 y.o. male  past mental history significant for chronic hypoxic respiratory failure, feeding tube, high blood pressure, low back pain, who presents emergency room with complaint shortness of breath. History mostly gathered from patient's wife as patient is demented at baseline. Per patient's wife patient has been in the hospital the last 2 months. Patient has been acutely short of breath for the last 2 days. Patient has been having a productive cough. Patient has had no fevers, chills, nausea, vomiting, diarrhea. No medications tried for patient's symptoms. Patient was brought to the emergency room for continuous complaints of shortness of breath.  ED course: X-ray showed nebulous abnormalities. Troponin mildly elevated at 0.07. Hospitalists consulted for admission.  Review of Systems:  As per HPI otherwise 10 point review of systems negative.    Past Medical History:  Diagnosis Date  . Aspiration pneumonia (HCC) 01/17/2016  . Bilateral dry eyes   . Bile peritonitis due to gastric tube dislodgement 12/03/2015  . BPH (benign prostatic hyperplasia)   . Central pterygium of right eye   . Chronic respiratory failure with hypoxia (HCC)   . Coronary artery disease 1997, 2009   bare mental stent right coronart artery, occlusive followed by Dr. Garnette Scheuermann in [place]  . Degenerative disc disease    LOWER BACK  . Dysphonia   . ED (erectile dysfunction)   . Gastric outlet obstruction 11/24/2015  . Gastroesophageal reflux disease   . Hyperlipidemia   . Hyperopia of both eyes with astigmatism and presbyopia   . Hypertension   . Ischemic right index finger at tip 12/09/2015  . Nuclear senile cataract    Dr. [facility] in [place]  . Peptic ulcer     . Pneumatosis of intestines 12/11/2015  . Pneumonia    HISTORY OF IN CHILDHOOD  . Posterior vitreous detachment 10/30/2012  . Pyloric stricture 11/24/2015  . Salzmann's nodular dystrophy of left eye   . SBO (small bowel obstruction) 01/20/2016  . Urinary hesitancy   . Wears glasses    Past Surgical History:  Procedure Laterality Date  . CARDIAC CATHETERIZATION    . CHOLECYSTECTOMY    . ESOPHAGOGASTRODUODENOSCOPY N/A 12/01/2015   Procedure: ESOPHAGOGASTRODUODENOSCOPY (EGD) BALLOON DILATION OF DUODENAL STRICTURE;  Surgeon: Karie Soda, MD;  Location: [facility];  Service: General;  Laterality: N/A;  . ESOPHAGOGASTRODUODENOSCOPY N/A 12/14/2015   Procedure: ESOPHAGOGASTRODUODENOSCOPY (EGD);  Surgeon: Vida Rigger, MD;  Location: [facility];  Service: Endoscopy;  Laterality: N/A;  Patient has tracheostomy  . ESOPHAGOGASTRODUODENOSCOPY (EGD) WITH PROPOFOL N/A 07/01/2015   Procedure: ESOPHAGOGASTRODUODENOSCOPY (EGD) WITH PROPOFOL;  Surgeon: Willis Modena, MD;  Location: [facility];  Service: Endoscopy;  Laterality: N/A;  . ESOPHAGOGASTRODUODENOSCOPY (EGD) WITH PROPOFOL N/A 11/23/2015   Procedure: ESOPHAGOGASTRODUODENOSCOPY (EGD) WITH PROPOFOL;  Surgeon: Willis Modena, MD;  Location: [facility];  Service: Endoscopy;  Laterality: N/A;  may need to intubate  . EYE SURGERY     growth on right eye removed   . IR GENERIC HISTORICAL  12/13/2015   IR REPLC DUODEN/JEJUNO TUBE PERCUT W/FLUORO 12/13/2015 [name], MD [facility]  . IR GENERIC HISTORICAL  02/03/2016   IR REPLC GASTRO/COLONIC TUBE PERCUT W/FLUORO 02/03/2016 Berdine Dance, MD [facility]  . IR GENERIC HISTORICAL  02/04/2016   IR GASTR TUBE CONVERT GASTR-JEJ PER W/FL MOD SED  02/04/2016 Malachy Moan, MD [facility]  . IR GENERIC HISTORICAL  02/25/2016   IR SINUS/FIST TUBE CHK-NON GI 02/25/2016 Irish Lack, MD [facility]  . IR GENERIC HISTORICAL  03/18/2016   IR CM INJ ANY COLONIC TUBE W/FLUORO 03/18/2016 Simonne Come, MD  [facility]  . IR GENERIC HISTORICAL  03/18/2016   IR GJ TUBE CHANGE 03/18/2016 Simonne Come, MD [facility]  . IR GENERIC HISTORICAL  03/22/2016   IR CM INJ ANY COLONIC TUBE W/FLUORO 03/22/2016 Simonne Come, MD [facility]  . IR GENERIC HISTORICAL  04/13/2016   IR CM INJ ANY COLONIC TUBE W/FLUORO 04/13/2016 Richarda Overlie, MD [facility]  . LAPAROSCOPIC GASTROSTOMY N/A 12/01/2015   Procedure: LAPAROSCOPIC PLACEMENT OF FEEDING JEJUNOSTOMY AND GASTROSTOMY TUBE;  Surgeon: Karie Soda, MD;  Location: [facility];  Service: General;  Laterality: N/A;  . LAPAROSCOPY N/A 12/03/2015   Procedure: LAPAROSCOPY DIAGNOSTIC, OMENTOPEXY, JEJUNOSTOMY, WASH OUT;  Surgeon: Karie Soda, MD;  Location: [facility];  Service: General;  Laterality: N/A;  . LAPAROTOMY N/A 02/06/2016   Procedure: EXPLORATORY LAPAROTOMY, LYSIS OF ADHESIONS, SEGMENTAL SMALL BOWEL RESECTION, GASTROJEJUNOSTOMY;  Surgeon: Chevis Pretty III, MD;  Location: [facility];  Service: General;  Laterality: N/A;  . STENT TO HEART     2 1997 AND 1 IN 1996  . TONSILLECTOMY    . XI ROBOTIC VAGOTOMY AND ANTRECTOMY N/A 09/25/2015   Procedure: XI ROBOTIC ANTERIOR AND POSTERIOR VAGOTOMY, BILROTH I  ANASTOMOSIS DOR FUNDIPLICATION, OMENTOPEXY  UPPER ENDOSCOPY;  Surgeon: Karie Soda, MD;  Location: [facility];  Service: General;  Laterality: N/A;   Social History:  reports that he quit smoking about 21 years ago. His smoking use included Cigarettes. He has a 40.00 pack-year smoking history. He has never used smokeless tobacco. He reports that he does not drink alcohol or use drugs.  Allergies  Allergen Reactions  . Flomax [Tamsulosin Hcl] Other (See Comments)    Leg weakness  . Lisinopril Cough  . Lorazepam Other (See Comments)    "i don't like it"  Prefers Xanax or valium  . Uroxatral [Alfuzosin Hcl Er] Other (See Comments)    Leg weakness    Family History  Problem Relation Age of Onset  . Heart attack Mother      Prior to Admission medications   Medication Sig Start  Date End Date Taking? Authorizing Provider  Alprazolam 1 MG/ML CONC Take 0.5 mLs (0.5 mg total) by mouth at bedtime and may repeat dose one time if needed. Patient taking differently: Place 0.5 mg into feeding tube at bedtime and may repeat dose one time if needed.  03/28/16  Yes Evlyn Kanner Love, PA-C  chlorhexidine (PERIDEX) 0.12 % solution 15 mLs by Mouth Rinse route 2 (two) times daily. 03/28/16  Yes Evlyn Kanner Love, PA-C  cycloSPORINE (RESTASIS) 0.05 % ophthalmic emulsion Place 1 drop into both eyes 2 (two) times daily. 02/26/16  Yes Jerre Simon, PA  erythromycin ethylsuccinate (EES) 200 MG/5ML suspension Place 10 mLs (400 mg total) into feeding tube every 6 (six) hours. 03/28/16  Yes Evlyn Kanner Love, PA-C  FLUoxetine (PROZAC) 20 MG/5ML solution GIVE INTO FEEDING TUBE ONCE DAILY. 04/14/16  Yes Ankit Karis Juba, MD  lidocaine (LIDODERM) 5 % Apply to back at 7 pm and remove at 7 am daily 03/28/16  Yes Evlyn Kanner Love, PA-C  lip balm (CARMEX) ointment Apply 1 application topically 2 (two) times daily. 02/26/16  Yes Jerre Simon, PA  liver oil-zinc oxide (DESITIN) 40 % ointment  Apply topically 2 (two) times daily. 02/26/16  Yes Jerre SimonJessica L Focht, PA  magic mouthwash SOLN Take 15 mLs by mouth 4 (four) times daily as needed for mouth pain (sore throat). 03/28/16  Yes Evlyn KannerPamela S Love, PA-C  menthol-cetylpyridinium (CEPACOL) 3 MG lozenge Take 1 lozenge (3 mg total) by mouth as needed for sore throat. 02/26/16  Yes Jerre SimonJessica L Focht, PA  Menthol-Methyl Salicylate (MUSCLE RUB) 10-15 % CREA Apply 1 application topically 3 (three) times daily before meals. 03/28/16  Yes Evlyn KannerPamela S Love, PA-C  mouth rinse LIQD solution 15 mLs by Mouth Rinse route 2 times daily at 12 noon and 4 pm. 02/26/16  Yes Jerre SimonJessica L Focht, PA  Multiple Vitamin (MULTIVITAMIN) LIQD Place 15 mLs into feeding tube daily. 03/31/16  Yes Evlyn KannerPamela S Love, PA-C  nitroGLYCERIN (NITROSTAT) 0.4 MG SL tablet Place 1 tablet (0.4 mg total) under the tongue every 5 (five)  minutes as needed for chest pain. 02/26/16  Yes Jerre SimonJessica L Focht, PA  Nutritional Supplements (FEEDING SUPPLEMENT, JEVITY 1.2 CAL,) LIQD Place 1,000 mLs into feeding tube daily. 03/30/16  Yes Evlyn KannerPamela S Love, PA-C  nystatin (MYCOSTATIN) 100000 UNIT/ML suspension Place 30 mLs into feeding tube 4 (four) times daily. 04/13/16  Yes Historical Provider, MD  omeprazole (PRILOSEC) 2 mg/mL SUSP Place 20 mLs (40 mg total) into feeding tube daily. 03/29/16  Yes Evlyn KannerPamela S Love, PA-C  polyvinyl alcohol (LIQUIFILM TEARS) 1.4 % ophthalmic solution Place 1 drop into both eyes every 8 (eight) hours as needed (dry). 02/26/16  Yes Jerre SimonJessica L Focht, PA  prochlorperazine (COMPAZINE) 25 MG suppository Place 1 suppository (25 mg total) rectally every 12 (twelve) hours as needed for nausea or vomiting. 03/30/16  Yes Evlyn KannerPamela S Love, PA-C  sodium chloride flush (NS) 0.9 % SOLN 10-40 mLs by Intracatheter route as needed (flush). 02/26/16  Yes Jerre SimonJessica L Focht, PA   Physical Exam: Vitals:   04/15/16 0950 04/15/16 0952 04/15/16 1015 04/15/16 1030  BP: 165/87  160/65 174/61  Pulse: 79  (!) 55 94  Resp: 17  18 22   Temp: 97.6 F (36.4 C)     TempSrc: Oral     SpO2: 97%  97% 98%  Weight:  73.9 kg (163 lb)    Height:  6\' 2"  (1.88 m)      Wt Readings from Last 3 Encounters:  04/15/16 73.9 kg (163 lb)  03/30/16 74 kg (163 lb 2.3 oz)  02/26/16 78.6 kg (173 lb 4.5 oz)     General:  Appears calm and comfortable, Alert and oriented to person only Eyes:  PERRL, EOMI, normal lids, iris ENT:  grossly normal hearing, lips & tongue Neck:  no LAD, masses or thyromegaly Cardiovascular:  RRR, no m/r/g. Nonpitting 1+ edema most likely due to deconditioning  Respiratory:  CTA bilaterally, no w/r/r. Normal respiratory effort. Abdomen:  soft, ntnd Skin:  no rash or induration seen on limited exam, healing wound in midline abdomen covered with gauze Musculoskeletal:  grossly normal tone BUE/BLE Psychiatric:  grossly normal mood and affect,  speech fluent and appropriate Neurologic:  CN 2-12 grossly intact, moves all extremities in coordinated fashion.          Labs on Admission:  Basic Metabolic Panel:  Recent Labs Lab 04/15/16 1031  NA 150*  K 3.0*  CL 115*  CO2 26  GLUCOSE 106*  BUN 25*  CREATININE 0.75  CALCIUM 8.4*   Liver Function Tests:  Recent Labs Lab 04/15/16 1031  AST 23  ALT 22  ALKPHOS 113  BILITOT 0.3  PROT 6.2*  ALBUMIN 2.0*   No results for input(s): LIPASE, AMYLASE in the last 168 hours. No results for input(s): AMMONIA in the last 168 hours. CBC:  Recent Labs Lab 04/15/16 1031  WBC 7.5  NEUTROABS 4.8  HGB 10.2*  HCT 32.4*  MCV 94.2  PLT 276   Cardiac Enzymes:  Recent Labs Lab 04/15/16 1031  TROPONINI 0.07*    BNP (last 3 results)  Recent Labs  04/15/16 1031  BNP 1,832.6*    ProBNP (last 3 results) No results for input(s): PROBNP in the last 8760 hours.   Serum creatinine: 0.75 mg/dL 16/10/96 0454 Estimated creatinine clearance: 82.1 mL/min  CBG: No results for input(s): GLUCAP in the last 168 hours.  Radiological Exams on Admission: Dg Chest 2 View  Result Date: 04/15/2016 CLINICAL DATA:  Shortness of breath over the last week. EXAM: CHEST  2 VIEW COMPARISON:  02/28/2016 FINDINGS: Left arm PICC tip in the SVC 3 cm above the right atrium. Bilateral pleural effusions with dependent pulmonary atelectasis and/or pneumonia. Possible fluid overload/ interstitial edema. No acute bone finding. IMPRESSION: Bilateral effusions with dependent atelectasis/lower lung pneumonia. Possible fluid overload/ CHF as well. Electronically Signed   By: Paulina Fusi M.D.   On: 04/15/2016 11:24    EKG: Independently reviewed. No STEMI.  Assessment/Plan Principal Problem:   Respiratory failure, acute (HCC) Active Problems:   Essential hypertension, benign   Hyperlipidemia   Anxiety state   Tobacco abuse   Hypernatremia   Hypokalemia   Elevated troponin  Acute hypoxic  respiratory failure X-ray with nebulous features Ordering CT chest without contrast Scheduled DuoNeb's When necessary albuterol Empiric Rocephin based on risk of aspiration, ordered 1 dose  Low K Will replace via J tube Recheck in AM  Troponin elevation Likely due to hypoxia Serial troponin Echo tomorrow Aspirin 325mg  qd Cardio consult req by caregiver  Hypernatremia Serial Na Increased free water flushes  Fungal infxn Cont Mycostatin 4 times a day  FTT/J tube for feeding Erythromycin every 6h  Mood disorder Prozac daily Xanax daily at bedtime  Back pain Continue Lidoderm patches  Nausea Cont prn supp of compazine  Eye Cont restasis and liquifilm  CP Prn ntg sl Aspirin Serial trop ECHO  GERD Cont PPI  Skin breakdown All desitin bid  Code Status: FULL DVT Prophylaxis:  Family Communication: SCDs Disposition Plan: Pending Improvement  Status: tele obs  Haydee Salter, MD Family Medicine [facility] [email or web address] Password [email or web address]

## 2016-04-15 NOTE — ED Triage Notes (Signed)
Pt brought in by EMS due to having SOB and weakness x2 days. Per EMS pt oxygen sat was 88-90% on room air. Oxygen sat is 95-985 on 2L. Pt has PICC line and feeding tube. Per EMS pt has hx of dysphagia and slurred speech.

## 2016-04-15 NOTE — ED Notes (Signed)
Patient transported to CT 

## 2016-04-15 NOTE — Telephone Encounter (Signed)
Thanks

## 2016-04-16 DIAGNOSIS — F411 Generalized anxiety disorder: Secondary | ICD-10-CM | POA: Diagnosis present

## 2016-04-16 DIAGNOSIS — E876 Hypokalemia: Secondary | ICD-10-CM | POA: Diagnosis present

## 2016-04-16 DIAGNOSIS — E785 Hyperlipidemia, unspecified: Secondary | ICD-10-CM | POA: Diagnosis present

## 2016-04-16 DIAGNOSIS — E870 Hyperosmolality and hypernatremia: Secondary | ICD-10-CM | POA: Diagnosis present

## 2016-04-16 DIAGNOSIS — I248 Other forms of acute ischemic heart disease: Secondary | ICD-10-CM | POA: Diagnosis present

## 2016-04-16 DIAGNOSIS — Z888 Allergy status to other drugs, medicaments and biological substances status: Secondary | ICD-10-CM | POA: Diagnosis not present

## 2016-04-16 DIAGNOSIS — J9690 Respiratory failure, unspecified, unspecified whether with hypoxia or hypercapnia: Secondary | ICD-10-CM | POA: Diagnosis present

## 2016-04-16 DIAGNOSIS — Z87891 Personal history of nicotine dependence: Secondary | ICD-10-CM | POA: Diagnosis not present

## 2016-04-16 DIAGNOSIS — Z6826 Body mass index (BMI) 26.0-26.9, adult: Secondary | ICD-10-CM | POA: Diagnosis not present

## 2016-04-16 DIAGNOSIS — K219 Gastro-esophageal reflux disease without esophagitis: Secondary | ICD-10-CM | POA: Diagnosis present

## 2016-04-16 DIAGNOSIS — F329 Major depressive disorder, single episode, unspecified: Secondary | ICD-10-CM | POA: Diagnosis present

## 2016-04-16 DIAGNOSIS — I5033 Acute on chronic diastolic (congestive) heart failure: Secondary | ICD-10-CM | POA: Diagnosis not present

## 2016-04-16 DIAGNOSIS — J9621 Acute and chronic respiratory failure with hypoxia: Secondary | ICD-10-CM | POA: Diagnosis present

## 2016-04-16 DIAGNOSIS — Z79899 Other long term (current) drug therapy: Secondary | ICD-10-CM | POA: Diagnosis not present

## 2016-04-16 DIAGNOSIS — I2510 Atherosclerotic heart disease of native coronary artery without angina pectoris: Secondary | ICD-10-CM | POA: Diagnosis present

## 2016-04-16 DIAGNOSIS — I472 Ventricular tachycardia: Secondary | ICD-10-CM | POA: Diagnosis present

## 2016-04-16 DIAGNOSIS — R627 Adult failure to thrive: Secondary | ICD-10-CM | POA: Diagnosis present

## 2016-04-16 DIAGNOSIS — R748 Abnormal levels of other serum enzymes: Secondary | ICD-10-CM | POA: Diagnosis not present

## 2016-04-16 DIAGNOSIS — Z959 Presence of cardiac and vascular implant and graft, unspecified: Secondary | ICD-10-CM | POA: Diagnosis not present

## 2016-04-16 DIAGNOSIS — F0390 Unspecified dementia without behavioral disturbance: Secondary | ICD-10-CM | POA: Diagnosis present

## 2016-04-16 DIAGNOSIS — J9811 Atelectasis: Secondary | ICD-10-CM | POA: Diagnosis present

## 2016-04-16 DIAGNOSIS — R5381 Other malaise: Secondary | ICD-10-CM | POA: Diagnosis not present

## 2016-04-16 DIAGNOSIS — J690 Pneumonitis due to inhalation of food and vomit: Secondary | ICD-10-CM | POA: Diagnosis present

## 2016-04-16 DIAGNOSIS — K30 Functional dyspepsia: Secondary | ICD-10-CM | POA: Diagnosis present

## 2016-04-16 DIAGNOSIS — J9600 Acute respiratory failure, unspecified whether with hypoxia or hypercapnia: Secondary | ICD-10-CM | POA: Diagnosis present

## 2016-04-16 DIAGNOSIS — I10 Essential (primary) hypertension: Secondary | ICD-10-CM | POA: Diagnosis not present

## 2016-04-16 DIAGNOSIS — R1313 Dysphagia, pharyngeal phase: Secondary | ICD-10-CM | POA: Diagnosis present

## 2016-04-16 DIAGNOSIS — R0602 Shortness of breath: Secondary | ICD-10-CM

## 2016-04-16 DIAGNOSIS — J9601 Acute respiratory failure with hypoxia: Secondary | ICD-10-CM | POA: Diagnosis not present

## 2016-04-16 DIAGNOSIS — E43 Unspecified severe protein-calorie malnutrition: Secondary | ICD-10-CM | POA: Diagnosis present

## 2016-04-16 DIAGNOSIS — I4729 Other ventricular tachycardia: Secondary | ICD-10-CM

## 2016-04-16 DIAGNOSIS — I110 Hypertensive heart disease with heart failure: Secondary | ICD-10-CM | POA: Diagnosis present

## 2016-04-16 LAB — BASIC METABOLIC PANEL
Anion gap: 6 (ref 5–15)
Anion gap: 6 (ref 5–15)
BUN: 20 mg/dL (ref 6–20)
BUN: 21 mg/dL — AB (ref 6–20)
CALCIUM: 8.5 mg/dL — AB (ref 8.9–10.3)
CHLORIDE: 111 mmol/L (ref 101–111)
CO2: 30 mmol/L (ref 22–32)
CO2: 30 mmol/L (ref 22–32)
CREATININE: 0.76 mg/dL (ref 0.61–1.24)
CREATININE: 0.81 mg/dL (ref 0.61–1.24)
Calcium: 8.5 mg/dL — ABNORMAL LOW (ref 8.9–10.3)
Chloride: 111 mmol/L (ref 101–111)
GFR calc Af Amer: >60 mL/min (ref >60)
GFR calc Af Amer: >60 mL/min (ref >60)
GFR calc non Af Amer: >60 mL/min (ref >60)
Glucose, Bld: 112 mg/dL — ABNORMAL HIGH (ref 65–99)
Glucose, Bld: 117 mg/dL — ABNORMAL HIGH (ref 65–99)
POTASSIUM: 3.2 mmol/L — AB (ref 3.5–5.1)
Potassium: 3.4 mmol/L — ABNORMAL LOW (ref 3.5–5.1)
SODIUM: 147 mmol/L — AB (ref 135–145)
SODIUM: 147 mmol/L — AB (ref 135–145)

## 2016-04-16 LAB — GLUCOSE, CAPILLARY: Glucose-Capillary: 112 mg/dL — ABNORMAL HIGH (ref 65–99)

## 2016-04-16 LAB — CBC
HEMATOCRIT: 33 % — AB (ref 39.0–52.0)
Hemoglobin: 10.2 g/dL — ABNORMAL LOW (ref 13.0–17.0)
MCH: 29.1 pg (ref 26.0–34.0)
MCHC: 30.9 g/dL (ref 30.0–36.0)
MCV: 94 fL (ref 78.0–100.0)
PLATELETS: 295 10*3/uL (ref 150–400)
RBC: 3.51 MIL/uL — ABNORMAL LOW (ref 4.22–5.81)
RDW: 15.6 % — ABNORMAL HIGH (ref 11.5–15.5)
WBC: 8.2 10*3/uL (ref 4.0–10.5)

## 2016-04-16 LAB — TROPONIN I: Troponin I: 0.06 ng/mL (ref <0.03)

## 2016-04-16 MED ORDER — FUROSEMIDE 10 MG/ML IJ SOLN
80.0000 mg | Freq: Two times a day (BID) | INTRAMUSCULAR | Status: DC
  Administered 2016-04-16 – 2016-04-17 (×2): 80 mg via INTRAVENOUS
  Filled 2016-04-16 (×2): qty 8

## 2016-04-16 MED ORDER — PIPERACILLIN-TAZOBACTAM 3.375 G IVPB
3.3750 g | Freq: Three times a day (TID) | INTRAVENOUS | Status: DC
  Administered 2016-04-16 – 2016-04-22 (×19): 3.375 g via INTRAVENOUS
  Filled 2016-04-16 (×23): qty 50

## 2016-04-16 MED ORDER — SODIUM PHOSPHATES 15 MMOLE/5ML IV SOLN
INTRAVENOUS | Status: AC
  Administered 2016-04-16: 18:00:00 via INTRAVENOUS
  Filled 2016-04-16: qty 960

## 2016-04-16 MED ORDER — POTASSIUM CHLORIDE CRYS ER 20 MEQ PO TBCR: 40.0000 meq | EXTENDED_RELEASE_TABLET | Freq: Once | ORAL | Status: DC

## 2016-04-16 MED ORDER — POTASSIUM CHLORIDE 20 MEQ/15ML (10%) PO SOLN
40.0000 meq | Freq: Once | ORAL | Status: AC
  Administered 2016-04-16: 40 meq
  Filled 2016-04-16: qty 30

## 2016-04-16 MED ORDER — SODIUM CHLORIDE 0.9 % IV SOLN
30.0000 meq | Freq: Once | INTRAVENOUS | Status: AC
  Administered 2016-04-16: 30 meq via INTRAVENOUS
  Filled 2016-04-16: qty 15

## 2016-04-16 MED ORDER — IPRATROPIUM-ALBUTEROL 0.5-2.5 (3) MG/3ML IN SOLN
3.0000 mL | Freq: Three times a day (TID) | RESPIRATORY_TRACT | Status: DC
  Administered 2016-04-17 – 2016-04-21 (×13): 3 mL via RESPIRATORY_TRACT
  Filled 2016-04-16 (×14): qty 3

## 2016-04-16 MED ORDER — ORAL CARE MOUTH RINSE
15.0000 mL | Freq: Two times a day (BID) | OROMUCOSAL | Status: DC
  Administered 2016-04-16 – 2016-04-22 (×12): 15 mL via OROMUCOSAL

## 2016-04-16 MED ORDER — CHLORHEXIDINE GLUCONATE 0.12 % MT SOLN
15.0000 mL | Freq: Two times a day (BID) | OROMUCOSAL | Status: DC
  Administered 2016-04-16 – 2016-04-22 (×13): 15 mL via OROMUCOSAL
  Filled 2016-04-16 (×13): qty 15

## 2016-04-16 MED ORDER — CARVEDILOL 3.125 MG PO TABS
3.1250 mg | ORAL_TABLET | Freq: Two times a day (BID) | ORAL | Status: DC
  Administered 2016-04-16 – 2016-04-17 (×2): 3.125 mg via ORAL
  Filled 2016-04-16 (×2): qty 1

## 2016-04-16 MED ORDER — MAGNESIUM SULFATE 2 GM/50ML IV SOLN
2.0000 g | Freq: Once | INTRAVENOUS | Status: AC
  Administered 2016-04-16: 2 g via INTRAVENOUS
  Filled 2016-04-16: qty 50

## 2016-04-16 NOTE — Progress Notes (Signed)
PHARMACY - ADULT TOTAL PARENTERAL NUTRITION CONSULT NOTE   Pharmacy Consult for TPN Indication:Chronic TPN for EC fistula  Patient Measurements: Height: 6' (182.9 cm) Weight: 177 lb 14.4 oz (80.7 kg) IBW/kg (Calculated) : 77.6 TPN AdjBW (KG): 87.3 Body mass index is 24.13 kg/m.  Assessment:  [age] YOM presents on 3/2 with trouble breathing. 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  On TFs and TPN PTA for nutrition. Pharmacy consulted to continue chronic TPN while inpatient.  GI: Has closed drain for abdomen and PEG tube. Albumin low at 2.0 Endo: No hx of DM. CBGs generally well controlled on TPN and TFs. Has not needed SSI in the past. Insulin requirements in the past 24 hours:  Lytes: wnl exc K low at 3.2 (active replacement) CoCa 10. Mg and Phos ok on 3/2 Renal: SCr stable, CrCl ~5885ml/min Pulm: 2L of [place] Cards: BP elevated and HR ok Hepatobil: LFTs ok, Tbili wnl. Neuro: fluoxetine, alprazolam ID: Continues on Zosyn.  Afebrile, WBC wnl.  Best Practices: SCDs TPN Access: PICC single lumen 3/2 >> (May get double lumen or a peripheral line for other IVs) TPN start date: Chronic TPN >>  Nutritional Goals (per RD recommendation on 2/7): KCal: 2100-2300 Protein: 95-110 g  Current Nutrition:  NPO Jevity 1.2 at 5240ml/hr (53 g of protein and 1,152 kcal)  [facility] TPN Formula 78 g of protein, 250 g of dextrose, 50 g of lipids (total kcal 1661) Gets TE in home TPN Also gets TFs PTA that meet rest of patient needs  Plan:  Start Clinimix E 5/15 cycle. Infuse 960 mL over 12 hrs: 50 mL/hr x 1 hr, then 86 mL/hr x 10 hrs, then 50 mL/hr x 1 hr. Hold IV lipid emulsions while on TFs Continue Jevity 1.2 at 7840ml/hr TPN + Jevity 1.2 provides 101 g of protein and 1,834 kCals per day meeting 100% of protein needs and ~83% of kCal needs Stop liquid MVI, add MVI in TPN  Add trace elements in TPN every other day,  next 3/4 Monitor glucose off of SSI for now Monitor TPN labs tomorrow  Enzo BiNathan Yariel Ferraris, PharmD, BCPS Clinical Pharmacist Pager [phone] 04/16/2016 9:25 AM

## 2016-04-16 NOTE — Progress Notes (Signed)
Pharmacy Antibiotic Note  [name] is a 76 y.o. male admitted on 04/15/2016 with possible aspiration pneumonia. Pharmacy has been consulted for Zosyn dosing. Patient is afebrile with a normal wbc. Scr is 0.81, normalized CrCl ~60-65 ml/min.   Plan: Zosyn 3.375g IV q8h (4 hour infusion).  Monitor clinical improvement and renal function   Height: 6' (182.9 cm) Weight: 177 lb 14.4 oz (80.7 kg) IBW/kg (Calculated) : 77.6  Temp (24hrs), Avg:97.9 F (36.6 C), Min:97.4 F (36.3 C), Max:98.5 F (36.9 C)   Recent Labs Lab 04/15/16 1031 04/15/16 1400 04/15/16 1950 04/15/16 2206 04/16/16 0157 04/16/16 0441  WBC 7.5  --   --   --   --  8.2  CREATININE 0.75 0.69 0.81 0.79 0.76 0.81    Estimated Creatinine Clearance: 85.2 mL/min (by C-G formula based on SCr of 0.81 mg/dL).    Allergies  Allergen Reactions  . Flomax [Tamsulosin Hcl] Other (See Comments)    Leg weakness  . Lisinopril Cough  . Lorazepam Other (See Comments)    "i don't like it"  Prefers Xanax or valium  . Uroxatral [Alfuzosin Hcl Er] Other (See Comments)    Leg weakness    Antimicrobials this admission:  3/2 Azithro + CTX x 1 dose Zosyn 3/3 >>  Thank you for allowing pharmacy to be a part of this patient's care.  Allie BossierApryl Anderson, PharmD PGY1 Pharmacy Resident [phone] (Pager) 04/16/2016 9:25 AM

## 2016-04-16 NOTE — Progress Notes (Signed)
Progress Note  Patient Name: [name] Date of Encounter: 04/16/2016  Primary Cardiologist:   Subjective   The patient feels tired, minimally improved SOB.  Inpatient Medications    Scheduled Meds: . Alprazolam  0.5 mg Per Tube QHS,MR X 1  . aspirin  325 mg Oral Daily  . chlorhexidine  15 mL Mouth Rinse BID  . cycloSPORINE  1 drop Both Eyes BID  . feeding supplement (JEVITY 1.2 CAL)  1,000 mL Per Tube Q24H  . FLUoxetine  20 mg Per Tube Daily  . furosemide  40 mg Intravenous BID  . guaiFENesin  10 mL Oral Q8H  . ipratropium-albuterol  3 mL Nebulization Q6H  . lidocaine  1 patch Transdermal Q24H  . lip balm  1 application Topical BID  . liver oil-zinc oxide   Topical BID  . mouth rinse  15 mL Mouth Rinse q12n4p  . nystatin  5 mL Per Tube QID  . omeprazole  40 mg Per Tube Daily  . piperacillin-tazobactam (ZOSYN)  IV  3.375 g Intravenous Q8H  . sodium chloride flush  3 mL Intravenous Q12H   Continuous Infusions: . sodium chloride    . TPN (CLINIMIX 5/15) Adult with electrolyte additives     PRN Meds: albuterol, menthol-cetylpyridinium, nitroGLYCERIN, polyvinyl alcohol, prochlorperazine, sodium chloride flush   Vital Signs    Vitals:   04/16/16 0024 04/16/16 0341 04/16/16 0509 04/16/16 0732  BP: (!) 141/69  (!) 160/72   Pulse: 84  67   Resp: 18  20   Temp: 97.8 F (36.6 C)  97.4 F (36.3 C)   TempSrc: Oral  Oral   SpO2: 98% 97% 97% 97%  Weight:   177 lb 14.4 oz (80.7 kg)   Height:        Intake/Output Summary (Last 24 hours) at 04/16/16 1253 Last data filed at 04/16/16 1015  Gross per 24 hour  Intake          1212.67 ml  Output              900 ml  Net           312.67 ml   Filed Weights   04/15/16 0952 04/15/16 1536 04/16/16 0509  Weight: 163 lb (73.9 kg) 192 lb 6.4 oz (87.3 kg) 177 lb 14.4 oz (80.7 kg)    Telemetry    SR, 1 episode of pSVT - 14 beats, 1 episode of nsVT - 16 beats, asymptomatic - Personally Reviewed   Physical Exam    Sleeping GEN: No acute distress.   Neck: No JVD Cardiac: RRR, no murmurs, rubs, or gallops.  Respiratory: Clear to auscultation bilaterally. GI: Soft, nontender, non-distended  MS: No edema; No deformity. Neuro:  Nonfocal  Psych: Normal affect   Labs    Chemistry Recent Labs Lab 04/15/16 1031  04/15/16 2206 04/16/16 0157 04/16/16 0441  NA 150*  < > 148* 147* 147*  K 3.0*  < > 3.0* 3.4* 3.2*  CL 115*  < > 110 111 111  CO2 26  < > 28 30 30   GLUCOSE 106*  < > 108* 112* 117*  BUN 25*  < > 22* 20 21*  CREATININE 0.75  < > 0.79 0.76 0.81  CALCIUM 8.4*  < > 8.5* 8.5* 8.5*  PROT 6.2*  --   --   --   --   ALBUMIN 2.0*  --   --   --   --   AST 23  --   --   --   --  ALT 22  --   --   --   --   ALKPHOS 113  --   --   --   --   BILITOT 0.3  --   --   --   --   GFRNONAA >60  < > >60 >60 >60  GFRAA >60  < > >60 >60 >60  ANIONGAP 9  < > 10 6 6   < > = values in this interval not displayed.   Hematology Recent Labs Lab 04/15/16 1031 04/16/16 0441  WBC 7.5 8.2  RBC 3.44* 3.51*  HGB 10.2* 10.2*  HCT 32.4* 33.0*  MCV 94.2 94.0  MCH 29.7 29.1  MCHC 31.5 30.9  RDW 15.8* 15.6*  PLT 276 295    Cardiac Enzymes Recent Labs Lab 04/15/16 1031 04/15/16 1400 04/15/16 1950 04/16/16 0157  TROPONINI 0.07* 0.06* 0.06* 0.06*   No results for input(s): TROPIPOC in the last 168 hours.   BNP Recent Labs Lab 04/15/16 1031  BNP 1,832.6*     DDimer No results for input(s): DDIMER in the last 168 hours.   Radiology    Dg Chest 2 View  Result Date: 04/15/2016 CLINICAL DATA:  Shortness of breath over the last week. EXAM: CHEST  2 VIEW COMPARISON:  02/28/2016 FINDINGS: Left arm PICC tip in the SVC 3 cm above the right atrium. Bilateral pleural effusions with dependent pulmonary atelectasis and/or pneumonia. Possible fluid overload/ interstitial edema. No acute bone finding. IMPRESSION: Bilateral effusions with dependent atelectasis/lower lung pneumonia. Possible fluid overload/ CHF  as well. Electronically Signed   By: Paulina FusiMark  Shogry M.D.   On: 04/15/2016 11:24   Ct Chest Wo Contrast  Result Date: 04/15/2016 CLINICAL DATA:  Acute respiratory failure, dyspnea for a few months EXAM: CT CHEST WITHOUT CONTRAST TECHNIQUE: Multidetector CT imaging of the chest was performed following the standard protocol without IV contrast. COMPARISON:  CXR 04/15/2016 and 02/28/2016 FINDINGS: Cardiovascular: PICC line noted with tip in the distal SVC. Normal size cardiac chambers without pericardial effusion. Coronary arteriosclerosis along the LAD and circumflex. Aortic atherosclerosis. No aortic aneurysm. Mediastinum/Nodes: No supraclavicular, axillary nor mediastinal lymphadenopathy. Normal branch pattern of the great vessels with minimal atherosclerosis at the origins. No thyromegaly or nodules. The mainstem bronchi and trachea are unremarkable apart from some minimal mucous along the dependent aspect of the distal trachea and proximal left mainstem bronchus. Lungs/Pleura: Small to moderate bilateral pleural effusions left greater than right with compressive atelectasis. Trace fluid extends into both major fissures. Centrilobular emphysema. Mild peribronchial thickening consistent with inflammation/ bronchitic change. Upper Abdomen: Partially visualized GJ tube. No acute abdominal abnormality to the extent visualized. Musculoskeletal: No chest wall mass or suspicious bone lesions identified. IMPRESSION: 1. Small to moderate bilateral pleural effusions with adjacent compressive atelectasis. 2. Mild bilateral peribronchial thickening suggesting inflammation/bronchitic change. 3. Coronary arteriosclerosis and aortic atherosclerosis. Electronically Signed   By: Tollie Ethavid  Kwon M.D.   On: 04/15/2016 13:29    Cardiac Studies     Assessment & Plan    Principal Problem:   Respiratory failure, acute (HCC) Active Problems:   Essential hypertension, benign   Hyperlipidemia   Anxiety state   Tobacco abuse    Hypernatremia   Hypokalemia   Elevated troponin  1. Acute on chronic diastolic heart failure - BNP 1832. Echo this admission showed normal LV systolic function; grade 1 diastolic function; mild AI. His albumin level in 2. Will started IV lasix 40mg  BID with positive fluid balance, this is affected by hypoalbuminemia. Continue  supplemental potassium. Strict I & O and daily weight. Increase lasix to 80 mg iv BID. Follow K and Crea closely.  2. Elevated troponin - Flat trend. Dement in setting of acute illness and CHF.   3. CAD s/p BMS to RCA in 1997 and DES to LAD in 2000 - No angina.  Last Myoview 04/2013 normal.   4. HTN - Elevated BP. IV PRN hydralazine.   5. Dysphagia -  On feeding tube  6. NsVT, pSVT - start carvedilol 3.125 mg po BID  Signed, Tobias Alexander, MD  04/16/2016, 12:53 PM

## 2016-04-16 NOTE — Progress Notes (Signed)
Initial Nutrition Assessment  DOCUMENTATION CODES:   Not applicable  INTERVENTION:  - Continue Jevity 1.2 @ 40 mL/hr. - TPN per Pharmacy. - RD will monitor for needs for adjustment of nutrition support regimens.   NUTRITION DIAGNOSIS:   Inadequate oral intake related to altered GI function as evidenced by other (see comment) (multiple past abdominal surgeries requiring nutrition support to meet needs).  GOAL:   Patient will meet greater than or equal to 90% of their needs  MONITOR:   TF tolerance, Weight trends, Labs, I & O's, Other (Comment) (TPN regimen)  REASON FOR ASSESSMENT:   Malnutrition Screening Tool, Consult New TPN/TNA  ASSESSMENT:   76 y.o. male  past mental history significant for chronic hypoxic respiratory failure, feeding tube, high blood pressure, low back pain, who presents emergency room with complaint shortness of breath. History mostly gathered from patient's wife as patient is demented at baseline. Per patient's wife patient has been in the hospital the last 2 months. Patient has been acutely short of breath for the last 2 days. Patient has been having a productive cough. Patient has had no fevers, chills, nausea, vomiting, diarrhea. No medications tried for patient's symptoms. Patient was brought to the emergency room for continuous complaints of shortness of breath.  Pt seen for MST and new TPN. BMI indicates overweight status, appropriate for age. Pt sleeping soundly with no family/visitors present. Pt very well known to this RD from previous admissions and has a very extensive abdominal surgical hx; previously with G-J tube and now with PEG only.   Pt currently receiving Jevity 1.2 @ 40 mL/hr which is providing 1152 kcal (57% minimum estimated kcal need), 53 grams of protein (53% minimum estimated protein need), and 775 mL free water. Order in place for cyclic TPN via single-lumen PICC: 960 mL Clinimix E 5/15 and no ILE while on TF; TPN will provide 682  kcal and 48 grams of protein. Total nutrition support regimen provides 1844 kcal (91% minimum estimated kcal need) and 101 grams of protein (minimum estimated protein need).   Unable to perform physical assessment at this time. Per chart review, weight +14 lbs from 03/30/16 and now consistent with weights on 01/19/16 and 02/26/16. Will monitor weight trends closely. Pt with hx of malnutrition but unable to document on this at this time without physical assessment.   Medications reviewed; 40 mg IV Lasix BID, 2 g IV Mg sulfate x1 dose today, 40 mEq oral KCl TID. Labs reviewed; Na: 147 mmol/L, K: 3.2 mmol/L, Ca: 8.5 mg/dL.    Diet Order:  TPN Mackie Pai(CLINIMIX 5/15) Adult with electrolyte additives  Skin:  Reviewed, no issues  Last BM:  PTA/unknown  Height:   Ht Readings from Last 1 Encounters:  04/15/16 6' (1.829 m)    Weight:   Wt Readings from Last 1 Encounters:  04/16/16 177 lb 14.4 oz (80.7 kg)    Ideal Body Weight:  80.91 kg  BMI:  Body mass index is 24.13 kg/m.  Estimated Nutritional Needs:   Kcal:  2020-2260 (25-28 kcal/kg)  Protein:  100-115 grams (~1.2-1.4 grams/kg)  Fluid:  >/= 2 L/day  EDUCATION NEEDS:   No education needs identified at this time    Trenton GammonJessica Dantavious Snowball, MS, RD, LDN, CNSC Inpatient Clinical Dietitian Pager # [phone] After hours/weekend pager # [phone]

## 2016-04-16 NOTE — Progress Notes (Signed)
[facility]                                                                              Patient Demographics  [name], is a 76 y.o. male, DOB - 1940-04-18, ZOX:[id number]  Admit date - 04/15/2016   Admitting Physician Haydee Salter, MD  Outpatient Primary MD for the patient is Lillia Mountain, MD  Outpatient specialists:   LOS - 0  days    Chief Complaint  Patient presents with  . Weakness  . Shortness of Breath       Brief summary    [name] is a 76 y.o. male  past mental history significant for chronic hypoxic respiratory failure, feeding tube, high blood pressure, low back pain, Who presented to ED with complaints of shortness of breath, productive cough for the last 2 days. Patient has dementia at the baseline and history was provided by his wife.    Assessment & Plan    Principal problem Acute hypoxic respiratory failure likely secondary to aspiration and acute diastolic CHF - Chest x-ray showed bilateral pleural effusions with dependent atelectasis/lower lung pneumonia possible fluid overload/CHF - CT chest showed small to moderate bilateral pleural effusions, mild bilateral peribronchial thickening suggesting inflammation/bronchitic changes - Continue duo nebs, placed on IV Zosyn  Active problems Acute diastolic CHF -2-D echo 3/2 showed EF of 50-55%, grade 1 diastolic dysfunction with diffuse hypokinesis  - Placed on strict I's and O's - Cardiology consulted, placed on IV Lasix 40 mg q12hrs  Elevated troponins - Possibly due to demand ischemia, cardiology following, 2-D echo with EF of 55% with grade 1 diastolic dysfunction, diffuse hypokinesis  Hypokalemia - Replaced  Dysphagia with G-tube feeding, severe malnutrition, hypoalbuminemia/failure to thrive - Albumin 2.0 - Continue Jevity per nutrition - Placed on TPN per pharmacy  Hypernatremia Increased free water flushes  Fungal infection  Cont Mycostatin 4 times  a day  Mood disorder Prozac daily Xanax daily at bedtime  Back pain Continue Lidoderm patches  GERD Cont PPI  Code Status: full  DVT Prophylaxis:   SCD's Family Communication: Discussed in detail with the patient, all imaging results, lab results explained to the patient    Disposition Plan:   Time Spent in minutes 25 minutes  Procedures:   Consultants:   Cardiology  Antimicrobials:   IV Zosyn 3/3   Medications  Scheduled Meds: . Alprazolam  0.5 mg Per Tube QHS,MR X 1  . aspirin  325 mg Oral Daily  . chlorhexidine  15 mL Mouth Rinse BID  . cycloSPORINE  1 drop Both Eyes BID  . feeding supplement (JEVITY 1.2 CAL)  1,000 mL Per Tube Q24H  . FLUoxetine  20 mg Per Tube Daily  . furosemide  40 mg Intravenous BID  . guaiFENesin  10 mL Oral Q8H  . ipratropium-albuterol  3 mL Nebulization Q6H  . lidocaine  1 patch Transdermal Q24H  . lip balm  1 application Topical BID  . liver oil-zinc oxide   Topical BID  . mouth rinse  15 mL Mouth Rinse q12n4p  . nystatin  5 mL Per Tube QID  . omeprazole  40 mg Per Tube Daily  . piperacillin-tazobactam (ZOSYN)  IV  3.375 g Intravenous Q8H  . sodium chloride flush  3 mL Intravenous Q12H   Continuous Infusions: . sodium chloride    . TPN (CLINIMIX 5/15) Adult with electrolyte additives     PRN Meds:.albuterol, menthol-cetylpyridinium, nitroGLYCERIN, polyvinyl alcohol, prochlorperazine, sodium chloride flush   Antibiotics   Anti-infectives    Start     Dose/Rate Route Frequency Ordered Stop   04/16/16 1000  piperacillin-tazobactam (ZOSYN) IVPB 3.375 g     3.375 g 12.5 mL/hr over 240 Minutes Intravenous Every 8 hours 04/16/16 0922     04/15/16 1645  azithromycin (ZITHROMAX) 500 mg in dextrose 5 % 250 mL IVPB  Status:  Discontinued     500 mg 250 mL/hr over 60 Minutes Intravenous Every 24 hours 04/15/16 1542 04/16/16 0922   04/15/16 1245  cefTRIAXone (ROCEPHIN) 1 g in dextrose 5 % 50 mL IVPB     1 g 100 mL/hr over 30  Minutes Intravenous  Once 04/15/16 1243 04/15/16 1812        Subjective:   [name] was seen and examined today.  Patient denies any complaints himself however has dementia and unable to obtain review of systems from the patient himself. No fevers this morning. Denies any chest pain.     Objective:   Vitals:   04/16/16 0024 04/16/16 0341 04/16/16 0509 04/16/16 0732  BP: (!) 141/69  (!) 160/72   Pulse: 84  67   Resp: 18  20   Temp: 97.8 F (36.6 C)  97.4 F (36.3 C)   TempSrc: Oral  Oral   SpO2: 98% 97% 97% 97%  Weight:   80.7 kg (177 lb 14.4 oz)   Height:        Intake/Output Summary (Last 24 hours) at 04/16/16 1201 Last data filed at 04/16/16 1015  Gross per 24 hour  Intake          1212.67 ml  Output              900 ml  Net           312.67 ml     Wt Readings from Last 3 Encounters:  04/16/16 80.7 kg (177 lb 14.4 oz)  03/30/16 74 kg (163 lb 2.3 oz)  02/26/16 78.6 kg (173 lb 4.5 oz)     Exam  General: Alert and oriented to self, ill-appearing, frail  HEENT:    Neck: Supple, no JVD, no masses  Cardiovascular: S1 S2 auscultated, no rubs, murmurs or gallops. Regular rate and rhythm.  Respiratory: Diminished breath sounds at the bases with rales   Gastrointestinal: Soft, nontender, nondistended, + bowel sounds, J tube  Ext: no cyanosis clubbing, 2+ edema, PICC in left arm  Neuro: AAOx3, Cr N's II- XII. Strength 5/5 upper and lower extremities bilaterally  Skin: No rashes  Psych: Normal affect and demeanor, alert and oriented x self   Data Reviewed:  I have personally reviewed following labs and imaging studies  Micro Results No results found for this or any previous visit (from the past 240 hour(s)).  Radiology Reports Dg Chest 2 View  Result Date: 04/15/2016 CLINICAL DATA:  Shortness of breath over the last week. EXAM: CHEST  2 VIEW COMPARISON:  02/28/2016 FINDINGS: Left arm PICC tip in the SVC 3 cm above the right atrium. Bilateral pleural  effusions with dependent pulmonary atelectasis and/or pneumonia. Possible fluid overload/ interstitial edema. No acute bone finding. IMPRESSION: Bilateral effusions  with dependent atelectasis/lower lung pneumonia. Possible fluid overload/ CHF as well. Electronically Signed   By: Paulina Fusi M.D.   On: 04/15/2016 11:24   Ct Chest Wo Contrast  Result Date: 04/15/2016 CLINICAL DATA:  Acute respiratory failure, dyspnea for a few months EXAM: CT CHEST WITHOUT CONTRAST TECHNIQUE: Multidetector CT imaging of the chest was performed following the standard protocol without IV contrast. COMPARISON:  CXR 04/15/2016 and 02/28/2016 FINDINGS: Cardiovascular: PICC line noted with tip in the distal SVC. Normal size cardiac chambers without pericardial effusion. Coronary arteriosclerosis along the LAD and circumflex. Aortic atherosclerosis. No aortic aneurysm. Mediastinum/Nodes: No supraclavicular, axillary nor mediastinal lymphadenopathy. Normal branch pattern of the great vessels with minimal atherosclerosis at the origins. No thyromegaly or nodules. The mainstem bronchi and trachea are unremarkable apart from some minimal mucous along the dependent aspect of the distal trachea and proximal left mainstem bronchus. Lungs/Pleura: Small to moderate bilateral pleural effusions left greater than right with compressive atelectasis. Trace fluid extends into both major fissures. Centrilobular emphysema. Mild peribronchial thickening consistent with inflammation/ bronchitic change. Upper Abdomen: Partially visualized GJ tube. No acute abdominal abnormality to the extent visualized. Musculoskeletal: No chest wall mass or suspicious bone lesions identified. IMPRESSION: 1. Small to moderate bilateral pleural effusions with adjacent compressive atelectasis. 2. Mild bilateral peribronchial thickening suggesting inflammation/bronchitic change. 3. Coronary arteriosclerosis and aortic atherosclerosis. Electronically Signed   By: Tollie Eth  M.D.   On: 04/15/2016 13:29   Ir Gj Tube Change  Result Date: 03/18/2016 INDICATION: Ruptured gastrostomy balloon. Please perform fluoroscopic guided gastrojejunostomy catheter exchange. EXAM: FLUOROSCOPIC GUIDED REPLACEMENT OF GASTROJEJUNOSTOMY TUBE COMPARISON:  Gastrojejunostomy catheter injection - earlier same day MEDICATIONS: None. CONTRAST:  10 cc Isovue-300 FLUOROSCOPY TIME:  1 minute 6 seconds (4.9 mGy) COMPLICATIONS: None. PROCEDURE: Informed written consent was obtained from the patient after a discussion of the risks and benefits. The upper abdomen and the external portion of the existing gastrojejunostomy tube was prepped and draped in the usual sterile fashion, and a sterile drape was applied covering the operative field. Maximum barrier sterile technique with sterile gowns and gloves were used for the procedure. A timeout was performed prior to the initiation of the procedure. The jejunal lumen of the existing gastrojejunostomy catheter was cannulated with a stiff Glidewire advanced into the proximal small bowel. Under intermittent fluoroscopic guidance, the existing gastric jejunostomy catheter was exchanged for a new 30 French GJ tube with tip ultimately terminating within the proximal small bowel. The gastrostomy balloon was inflated with saline and dilute contrast and pulled taut against the ventral wall of the stomach. The disc was cinched. Contrast injection via the jejunostomy and gastric lumens confirmed appropriate functioning and positioning. A dressing was placed. The patient tolerated procedure well without immediate postprocedural complication. IMPRESSION: Successful fluoroscopic guided replacement of 30 French gastrojejunostomy catheter. The tip of the jejunostomy lumen lies within the proximal jejunum. Both lumens ready for immediate use. Electronically Signed   By: Simonne Come M.D.   On: 03/18/2016 18:08   Ir Cm Inj Any Colonic Tube W/fluoro  Result Date: 04/13/2016 INDICATION:  76 year old with a GJ feeding tube. The jejunal lumen has been occluded for few days. Patient presents for tube evaluation and possible exchange. EXAM: GI TUBE INJECTION MEDICATIONS: None ANESTHESIA/SEDATION: None CONTRAST:  20 mL Omnipaque 300 - administered into the gastric lumen. FLUOROSCOPY TIME:  Fluoroscopy Time: 30 seconds, 1.4 mGy COMPLICATIONS: None immediate. PROCEDURE: Informed written consent was obtained from the patient's daughter after a thorough discussion of the  procedural risks, benefits and alternatives. All questions were addressed. Maximal Sterile Barrier Technique was utilized including caps, mask, sterile gowns, sterile gloves, sterile drape, hand hygiene and skin antiseptic. A timeout was performed prior to the initiation of the procedure. Patient was placed supine on the interventional table. The jejunal lumen was clogged. The lumen was flushed with a 10 cc syringe and was successfully unclogged. Contrast injection confirmed tube placement in the small bowel. The gastric lumen was also injected and confirmed placement in the stomach. Retention balloon remains inflated within the stomach. Patient's heart rate was irregular when he presented. Patient was placed on a monitor and appeared to have frequent premature atrial contractions. This finding was discussed with the patient's PCP, Dr. [name]. Patient was asymptomatic. IMPRESSION: The GJ tube was successfully unclogged. Jejunal tube is appropriately positioned in the small bowel and ready for use. Electronically Signed   By: Richarda Overlie M.D.   On: 04/13/2016 10:26   Ir Cm Inj Any Colonic Tube W/fluoro  Result Date: 03/22/2016 INDICATION: Inadvertent retraction of gastrojejunostomy catheter. EXAM: FLUOROSCOPIC GUIDED INJECTION OF GASTROJEJUNOSTOMY TUBE COMPARISON:  Fluoroscopic guided gastrojejunostomy catheter exchange - 03/18/2016 MEDICATIONS: None. CONTRAST:  20mL ISOVUE-300 IOPAMIDOL (ISOVUE-300) INJECTION 61% - administered into  the stomach and small bowel FLUOROSCOPY TIME:  36 seconds (2 mGy) COMPLICATIONS: None. PROCEDURE: The patient was positioned supine on the fluoroscopy table. Contrast was injected via both the gastrostomy and jejunostomy lumens demonstrating appropriate position functionality of both lumens. The gastrostomy balloon is normally positioned against the ventral wall of the gastric lumen. Both lumens were flushed with a small amount of saline and capped. A dressing was placed. The patient tolerated procedure well without immediate postprocedural complication. IMPRESSION: Appropriately positioned and functioning and gastrojejunostomy catheter. No exchange performed. Electronically Signed   By: Simonne Come M.D.   On: 03/22/2016 17:26   Ir Cm Inj Any Colonic Tube W/fluoro  Result Date: 03/18/2016 INDICATION: History of gastrojejunostomy catheter, now with concern for catheter dislodgement. EXAM: FLUOROSCOPIC GUIDED GASTROJEJUNOSTOMY TUBE INJECTION COMPARISON:  Small bowel series - 03/01/2016 MEDICATIONS: None. CONTRAST:  20 cc Isovue 300, administered via both the gastric and jejunal lumens FLUOROSCOPY TIME:  36 seconds (1 mGy) COMPLICATIONS: None. PROCEDURE: The patient was positioned supine on the fluoroscopy table. A preprocedural spot fluoroscopic image was obtained. Contrast injection of the gastric lumen demonstrates brisk opacification of the stomach lumen. Contrast injection of the gym lumen demonstrates brisk opacification of the small bowel. No evidence of catheter kink, fracture or contrast extravasation. A dressing was placed. The patient tolerated procedure well without immediate postprocedural complication. IMPRESSION: Appropriate positioned and functioning gastrojejunostomy tube. No exchange performed. Electronically Signed   By: Simonne Come M.D.   On: 03/18/2016 13:02   Dg Swallowing Func-speech Pathology  Result Date: 03/25/2016 Objective Swallowing Evaluation: Type of Study: MBS-Modified Barium  Swallow Study Patient Details Name: MARQUESE BURKLAND MRN: [id number] Date of Birth: 04-Sep-1940 Today's Date: 03/25/2016 Time: SLP Start Time (ACUTE ONLY): 1100-SLP Stop Time (ACUTE ONLY): 1130 SLP Time Calculation (min) (ACUTE ONLY): 30 min Past Medical History: Past Medical History: Diagnosis Date . Aspiration pneumonia (HCC) 01/17/2016 . Bilateral dry eyes  . Bile peritonitis due to gastric tube dislodgement 12/03/2015 . BPH (benign prostatic hyperplasia)  . Central pterygium of right eye  . Chronic respiratory failure with hypoxia (HCC)  . Coronary artery disease 1997, 2009  bare mental stent right coronart artery, occlusive followed by Dr. Garnette Scheuermann in [place] . Degenerative disc disease  LOWER BACK . Dysphonia  . ED (erectile dysfunction)  . Gastric outlet obstruction 11/24/2015 . Gastroesophageal reflux disease  . Hyperlipidemia  . Hyperopia of both eyes with astigmatism and presbyopia  . Hypertension  . Ischemic right index finger at tip 12/09/2015 . Nuclear senile cataract   Dr. [facility] in [place] . Peptic ulcer  . Pneumatosis of intestines 12/11/2015 . Pneumonia   HISTORY OF IN CHILDHOOD . Posterior vitreous detachment 10/30/2012 . Pyloric stricture 11/24/2015 . Salzmann's nodular dystrophy of left eye  . SBO (small bowel obstruction) 01/20/2016 . Urinary hesitancy  . Wears glasses  Past Surgical History: Past Surgical History: Procedure Laterality Date . CARDIAC CATHETERIZATION   . CHOLECYSTECTOMY   . ESOPHAGOGASTRODUODENOSCOPY N/A 12/01/2015  Procedure: ESOPHAGOGASTRODUODENOSCOPY (EGD) BALLOON DILATION OF DUODENAL STRICTURE;  Surgeon: Karie Soda, MD;  Location: [facility];  Service: General;  Laterality: N/A; . ESOPHAGOGASTRODUODENOSCOPY N/A 12/14/2015  Procedure: ESOPHAGOGASTRODUODENOSCOPY (EGD);  Surgeon: Vida Rigger, MD;  Location: [facility];  Service: Endoscopy;  Laterality: N/A;  Patient has tracheostomy . ESOPHAGOGASTRODUODENOSCOPY (EGD) WITH PROPOFOL N/A 07/01/2015  Procedure:  ESOPHAGOGASTRODUODENOSCOPY (EGD) WITH PROPOFOL;  Surgeon: Willis Modena, MD;  Location: [facility];  Service: Endoscopy;  Laterality: N/A; . ESOPHAGOGASTRODUODENOSCOPY (EGD) WITH PROPOFOL N/A 11/23/2015  Procedure: ESOPHAGOGASTRODUODENOSCOPY (EGD) WITH PROPOFOL;  Surgeon: Willis Modena, MD;  Location: [facility];  Service: Endoscopy;  Laterality: N/A;  may need to intubate . EYE SURGERY    growth on right eye removed  . IR GENERIC HISTORICAL  12/13/2015  IR REPLC DUODEN/JEJUNO TUBE PERCUT W/FLUORO 12/13/2015 [name], MD [facility] . IR GENERIC HISTORICAL  02/03/2016  IR REPLC GASTRO/COLONIC TUBE PERCUT W/FLUORO 02/03/2016 Berdine Dance, MD [facility] . IR GENERIC HISTORICAL  02/04/2016  IR GASTR TUBE CONVERT GASTR-JEJ PER W/FL MOD SED 02/04/2016 Malachy Moan, MD [facility] . IR GENERIC HISTORICAL  02/25/2016  IR SINUS/FIST TUBE CHK-NON GI 02/25/2016 Irish Lack, MD [facility] . IR GENERIC HISTORICAL  03/18/2016  IR CM INJ ANY COLONIC TUBE W/FLUORO 03/18/2016 Simonne Come, MD [facility] . IR GENERIC HISTORICAL  03/18/2016  IR GJ TUBE CHANGE 03/18/2016 Simonne Come, MD [facility] . IR GENERIC HISTORICAL  03/22/2016  IR CM INJ ANY COLONIC TUBE W/FLUORO 03/22/2016 Simonne Come, MD [facility] . LAPAROSCOPIC GASTROSTOMY N/A 12/01/2015  Procedure: LAPAROSCOPIC PLACEMENT OF FEEDING JEJUNOSTOMY AND GASTROSTOMY TUBE;  Surgeon: Karie Soda, MD;  Location: [facility];  Service: General;  Laterality: N/A; . LAPAROSCOPY N/A 12/03/2015  Procedure: LAPAROSCOPY DIAGNOSTIC, OMENTOPEXY, JEJUNOSTOMY, WASH OUT;  Surgeon: Karie Soda, MD;  Location: [facility];  Service: General;  Laterality: N/A; . LAPAROTOMY N/A 02/06/2016  Procedure: EXPLORATORY LAPAROTOMY, LYSIS OF ADHESIONS, SEGMENTAL SMALL BOWEL RESECTION, GASTROJEJUNOSTOMY;  Surgeon: Chevis Pretty III, MD;  Location: [facility];  Service: General;  Laterality: N/A; . STENT TO HEART    2 1997 AND 1 IN 1996 . TONSILLECTOMY   . XI ROBOTIC VAGOTOMY AND ANTRECTOMY N/A 09/25/2015   Procedure: XI ROBOTIC ANTERIOR AND POSTERIOR VAGOTOMY, BILROTH I  ANASTOMOSIS DOR FUNDIPLICATION, OMENTOPEXY  UPPER ENDOSCOPY;  Surgeon: Karie Soda, MD;  Location: [facility];  Service: General;  Laterality: N/A; HPI: LAUREN AGUAYO is a 76 y.o. male with h/o CAD and GERD who presents to the Emergency Department from [facility] by EMS complaining of nausea, vomiting, and diarrhea onset onset two days ago, worsening yesterday. Pt also has associated mild abdominal pain. Per daughter, pt has been admitted to the hospital recently for surgery in his abdomen; he has been  staying at [facility] since then. Daughter reported to MD that pt has been fed through a G tube which gave him diarrhea that eventually resolved with a change in formula.   Daughter also informed MD that pt gradually started feeding by mouth again x2 days with return of diarrhea along with nausea and vomiting. Daughter notes that the facility staff have tried to change his formula but with no relief to his symptoms.  Pt has known h/o dysphagia dysphonia, pna, peptic ulcer, GERD.  Last MBS done 12/29/15 showed moderate oropharyngeal dysphagia that looked marginally worse than test ono 12/17/15.  Pt left on ice chips only after oral care.  CXR 12/6 showed LLL consolidation ? ATX or pna.   Subjective: pt awake in chair Assessment / Plan / Recommendation CHL IP CLINICAL IMPRESSIONS 03/25/2016 Therapy Diagnosis Moderate pharyngeal phase dysphagia Clinical Impression Pt presents with moderate sensorimotor pharyngeal dysphagia resulting in silent aspiration of nectar thick liquids. Swallow function continues to be delayed and weak complicated by very weak cough mechanism. This results in delayed swallow initiaiton to the valluclua, intermittent flash penetration, one occurance of shallow frank penetration after the swallow and significant residue at the pyriform sinuses that was silently aspirated after the swallow with nectar thick  liquids. Pt unable to clear pharyngeal residue with cued cough or double swallow. After 4 nectar thick boluses, pt gagged  and hocked up pharyngeal residue. Two trials of thin liquids were presented via spoon with what appeared to be timely swallow initiation. Safety with thin liquids was difficult to assess as pt began to immediately excessively wretch. Trials were discontinued as excessive wretching continued. Pt didn't produce any vomitous. These same wretching behaviors are present in ST session after consuming 2 to 3 tsp trials of thin liquids. If wretching had not taken place, swallow study could have continued with further trials of thin liquids. Pt continues to be at high risk for aspiration complicated by GI issues that are present after ~ 6 tsp boluses of liquids. As a results, I recommend that pt remain NPO with small sips of thin H2O allowed under free water protocol as pt tolerates. Pt would also benefit from respiratory muscle training to strengthen cough and speech intensity. Education provided to PA. Of note, pt with wretching during ST session after only 3 to 4 tsp trials. Pt appears to have a gastrointestinal component that will affect his ability to return to PO intake for nutrition.  Impact on safety and function Risk for inadequate nutrition/hydration;Moderate aspiration risk   CHL IP TREATMENT RECOMMENDATION 03/25/2016 Treatment Recommendations Therapy as outlined in treatment plan below   Prognosis 03/25/2016 Prognosis for Safe Diet Advancement Guarded Barriers to Reach Goals Cognitive deficits;Time post onset;Severity of deficits;Other (Comment) Barriers/Prognosis Comment -- CHL IP DIET RECOMMENDATION 03/25/2016 SLP Diet Recommendations Free water protocol after oral care Liquid Administration via Spoon Medication Administration Via alternative means Compensations Small sips/bites Postural Changes Remain semi-upright after after feeds/meals (Comment);Seated upright at 90 degrees   CHL IP OTHER  RECOMMENDATIONS 03/25/2016 Recommended Consults Consider GI evaluation Oral Care Recommendations Oral care QID Other Recommendations Have oral suction available   CHL IP FOLLOW UP RECOMMENDATIONS 03/25/2016 Follow up Recommendations Skilled Nursing facility   [facility] IP FREQUENCY AND DURATION 01/25/2016 Speech Therapy Frequency (ACUTE ONLY) min 2x/week Treatment Duration 1 week      CHL IP ORAL PHASE 03/25/2016 Oral Phase WFL Oral - Pudding Teaspoon -- Oral - Pudding Cup -- Oral - Honey Teaspoon -- Oral - Honey  Cup -- Oral - Nectar Teaspoon WFL Oral - Nectar Cup WFL Oral - Nectar Straw NT Oral - Thin Teaspoon WFL Oral - Thin Cup WFL Oral - Thin Straw NT Oral - Puree NT Oral - Mech Soft NT Oral - Regular -- Oral - Multi-Consistency -- Oral - Pill -- Oral Phase - Comment --  CHL IP PHARYNGEAL PHASE 03/25/2016 Pharyngeal Phase Impaired Pharyngeal- Pudding Teaspoon -- Pharyngeal -- Pharyngeal- Pudding Cup -- Pharyngeal -- Pharyngeal- Honey Teaspoon -- Pharyngeal -- Pharyngeal- Honey Cup -- Pharyngeal -- Pharyngeal- Nectar Teaspoon Delayed swallow initiation-vallecula;Penetration/Apiration after swallow;Significant aspiration (Amount);Pharyngeal residue - pyriform;Pharyngeal residue - valleculae;Pharyngeal residue - posterior pharnyx;Reduced pharyngeal peristalsis Pharyngeal Material enters airway, passes BELOW cords without attempt by patient to eject out (silent aspiration) Pharyngeal- Nectar Cup Delayed swallow initiation-vallecula;Reduced pharyngeal peristalsis;Penetration/Apiration after swallow;Significant aspiration (Amount);Pharyngeal residue - pyriform;Pharyngeal residue - valleculae;Pharyngeal residue - posterior pharnyx;Compensatory strategies attempted (with notebox) Pharyngeal Material enters airway, passes BELOW cords without attempt by patient to eject out (silent aspiration) Pharyngeal- Nectar Straw NT Pharyngeal -- Pharyngeal- Thin Teaspoon Delayed swallow initiation-vallecula;Pharyngeal residue - pyriform  Pharyngeal Material enters airway, remains ABOVE vocal cords then ejected out Pharyngeal- Thin Cup NT Pharyngeal -- Pharyngeal- Thin Straw NT Pharyngeal -- Pharyngeal- Puree NT Pharyngeal -- Pharyngeal- Mechanical Soft -- Pharyngeal -- Pharyngeal- Regular NT Pharyngeal -- Pharyngeal- Multi-consistency -- Pharyngeal -- Pharyngeal- Pill -- Pharyngeal -- Pharyngeal Comment --  CHL IP CERVICAL ESOPHAGEAL PHASE 01/25/2016 Cervical Esophageal Phase Impaired Pudding Teaspoon -- Pudding Cup -- Honey Teaspoon -- Honey Cup -- Nectar Teaspoon Reduced cricopharyngeal relaxation Nectar Cup Reduced cricopharyngeal relaxation Nectar Straw -- Thin Teaspoon Reduced cricopharyngeal relaxation Thin Cup Reduced cricopharyngeal relaxation Thin Straw Reduced cricopharyngeal relaxation Puree Reduced cricopharyngeal relaxation;Esophageal backflow into cervical esophagus Mechanical Soft -- Regular -- Multi-consistency -- Pill -- Cervical Esophageal Comment -- No flowsheet data found. Happi B. Dreama Saa, M.S., CCC-SLP Speech-Language Pathologist Happi Overton 03/25/2016, 12:53 PM               Lab Data:  CBC:  Recent Labs Lab 04/15/16 1031 04/16/16 0441  WBC 7.5 8.2  NEUTROABS 4.8  --   HGB 10.2* 10.2*  HCT 32.4* 33.0*  MCV 94.2 94.0  PLT 276 295   Basic Metabolic Panel:  Recent Labs Lab 04/15/16 1218 04/15/16 1400 04/15/16 1950 04/15/16 2206 04/16/16 0157 04/16/16 0441  NA  --  148* 148* 148* 147* 147*  K  --  2.9* 3.3* 3.0* 3.4* 3.2*  CL  --  114* 111 110 111 111  CO2  --  28 29 28 30 30   GLUCOSE  --  95 112* 108* 112* 117*  BUN  --  23* 23* 22* 20 21*  CREATININE  --  0.69 0.81 0.79 0.76 0.81  CALCIUM  --  8.3* 8.6* 8.5* 8.5* 8.5*  MG 1.7  --   --   --   --   --   PHOS 3.4  --   --   --   --   --    GFR: Estimated Creatinine Clearance: 85.2 mL/min (by C-G formula based on SCr of 0.81 mg/dL). Liver Function Tests:  Recent Labs Lab 04/15/16 1031  AST 23  ALT 22  ALKPHOS 113  BILITOT 0.3  PROT  6.2*  ALBUMIN 2.0*   No results for input(s): LIPASE, AMYLASE in the last 168 hours. No results for input(s): AMMONIA in the last 168 hours. Coagulation Profile: No results for input(s): INR, PROTIME in the last 168 hours. Cardiac Enzymes:  Recent Labs Lab 04/15/16 1031 04/15/16 1400 04/15/16 1950 04/16/16 0157  TROPONINI 0.07* 0.06* 0.06* 0.06*   BNP (last 3 results) No results for input(s): PROBNP in the last 8760 hours. HbA1C: No results for input(s): HGBA1C in the last 72 hours. CBG: No results for input(s): GLUCAP in the last 168 hours. Lipid Profile: No results for input(s): CHOL, HDL, LDLCALC, TRIG, CHOLHDL, LDLDIRECT in the last 72 hours. Thyroid Function Tests: No results for input(s): TSH, T4TOTAL, FREET4, T3FREE, THYROIDAB in the last 72 hours. Anemia Panel: No results for input(s): VITAMINB12, FOLATE, FERRITIN, TIBC, IRON, RETICCTPCT in the last 72 hours. Urine analysis:    Component Value Date/Time   COLORURINE YELLOW 02/28/2016 1036   APPEARANCEUR CLEAR 02/28/2016 1036   LABSPEC 1.018 02/28/2016 1036   PHURINE 5.0 02/28/2016 1036   GLUCOSEU NEGATIVE 02/28/2016 1036   HGBUR NEGATIVE 02/28/2016 1036   BILIRUBINUR NEGATIVE 02/28/2016 1036   KETONESUR NEGATIVE 02/28/2016 1036   PROTEINUR NEGATIVE 02/28/2016 1036   NITRITE NEGATIVE 02/28/2016 1036   LEUKOCYTESUR NEGATIVE 02/28/2016 1036     Lorita Forinash M.D. [facility] 04/16/2016, 12:01 PM  Pager: [phone] Between 7am to 7pm - call Pager - [phone]  After 7pm go to [email or web address] - password [email or web address]  Call night coverage person covering after 7pm

## 2016-04-17 LAB — COMPREHENSIVE METABOLIC PANEL
ALK PHOS: 120 U/L (ref 38–126)
ALT: 23 U/L (ref 17–63)
ANION GAP: 4 — AB (ref 5–15)
AST: 29 U/L (ref 15–41)
Albumin: 1.9 g/dL — ABNORMAL LOW (ref 3.5–5.0)
BILIRUBIN TOTAL: 0.4 mg/dL (ref 0.3–1.2)
BUN: 21 mg/dL — ABNORMAL HIGH (ref 6–20)
CALCIUM: 8.5 mg/dL — AB (ref 8.9–10.3)
CO2: 34 mmol/L — AB (ref 22–32)
Chloride: 107 mmol/L (ref 101–111)
Creatinine, Ser: 0.93 mg/dL (ref 0.61–1.24)
GFR calc non Af Amer: >60 mL/min (ref >60)
Glucose, Bld: 109 mg/dL — ABNORMAL HIGH (ref 65–99)
POTASSIUM: 3.7 mmol/L (ref 3.5–5.1)
SODIUM: 145 mmol/L (ref 135–145)
TOTAL PROTEIN: 6.5 g/dL (ref 6.5–8.1)

## 2016-04-17 LAB — CBC
HCT: 33.7 % — ABNORMAL LOW (ref 39.0–52.0)
HEMOGLOBIN: 10.5 g/dL — AB (ref 13.0–17.0)
MCH: 29.7 pg (ref 26.0–34.0)
MCHC: 31.2 g/dL (ref 30.0–36.0)
MCV: 95.2 fL (ref 78.0–100.0)
Platelets: 309 10*3/uL (ref 150–400)
RBC: 3.54 MIL/uL — ABNORMAL LOW (ref 4.22–5.81)
RDW: 15.9 % — AB (ref 11.5–15.5)
WBC: 8.6 10*3/uL (ref 4.0–10.5)

## 2016-04-17 LAB — TRIGLYCERIDES: TRIGLYCERIDES: 102 mg/dL (ref <150)

## 2016-04-17 LAB — PREALBUMIN: Prealbumin: 17.9 mg/dL — ABNORMAL LOW (ref 18–38)

## 2016-04-17 LAB — DIFFERENTIAL
BASOS ABS: 0 10*3/uL (ref 0.0–0.1)
Basophils Relative: 0 %
Eosinophils Absolute: 0.3 10*3/uL (ref 0.0–0.7)
Eosinophils Relative: 4 %
LYMPHS ABS: 2 10*3/uL (ref 0.7–4.0)
LYMPHS PCT: 23 %
Monocytes Absolute: 0.9 10*3/uL (ref 0.1–1.0)
Monocytes Relative: 11 %
NEUTROS ABS: 5.4 10*3/uL (ref 1.7–7.7)
NEUTROS PCT: 62 %

## 2016-04-17 LAB — PHOSPHORUS: PHOSPHORUS: 4.4 mg/dL (ref 2.5–4.6)

## 2016-04-17 LAB — MAGNESIUM: Magnesium: 2 mg/dL (ref 1.7–2.4)

## 2016-04-17 MED ORDER — TRACE MINERALS CR-CU-MN-SE-ZN 10-1000-500-60 MCG/ML IV SOLN
INTRAVENOUS | Status: AC
  Administered 2016-04-17: 19:00:00 via INTRAVENOUS
  Filled 2016-04-17: qty 960

## 2016-04-17 MED ORDER — SODIUM CHLORIDE 0.9 % IV SOLN
30.0000 meq | INTRAVENOUS | Status: AC
  Administered 2016-04-17 (×2): 30 meq via INTRAVENOUS
  Filled 2016-04-17 (×3): qty 15

## 2016-04-17 MED ORDER — FUROSEMIDE 10 MG/ML IJ SOLN
80.0000 mg | Freq: Two times a day (BID) | INTRAMUSCULAR | Status: DC
  Administered 2016-04-17 – 2016-04-19 (×4): 80 mg via INTRAVENOUS
  Filled 2016-04-17 (×4): qty 8

## 2016-04-17 MED ORDER — HYDROCODONE-ACETAMINOPHEN 7.5-325 MG/15ML PO SOLN
10.0000 mL | ORAL | Status: DC | PRN
  Administered 2016-04-17 (×3): 10 mL
  Filled 2016-04-17 (×3): qty 15

## 2016-04-17 MED ORDER — TRACE MINERALS CR-CU-MN-SE-ZN 10-1000-500-60 MCG/ML IV SOLN
INTRAVENOUS | Status: DC
  Filled 2016-04-17: qty 960

## 2016-04-17 MED ORDER — CARVEDILOL 6.25 MG PO TABS
6.2500 mg | ORAL_TABLET | Freq: Two times a day (BID) | ORAL | Status: DC
  Administered 2016-04-17 – 2016-04-19 (×4): 6.25 mg via ORAL
  Filled 2016-04-17 (×4): qty 1

## 2016-04-17 NOTE — Progress Notes (Signed)
PHARMACY - ADULT TOTAL PARENTERAL NUTRITION CONSULT NOTE   Pharmacy Consult for TPN Indication:Chronic TPN for EC fistula  Patient Measurements: Height: 6' (182.9 cm) Weight: 180 lb 6.4 oz (81.8 kg) (c scale) IBW/kg (Calculated) : 77.6 TPN AdjBW (KG): 87.3 Body mass index is 24.47 kg/m.  Assessment:  89 YOM presents on 3/2 with trouble breathing. 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  On TFs and TPN PTA for nutrition. Pharmacy consulted to continue chronic TPN while inpatient.  GI: Has closed drain for abdomen and PEG tube. Albumin low at 1.9. Prealbumin slightly low at 17.9. Cycle not charted overnight.  Endo: No hx of DM. CBGs generally well controlled on TPN and TFs. Has not needed SSI in the past. AM glucose remains ok today Insulin requirements in the past 24 hours: 0 units Lytes: wnl exc K borderline low at 3.7 after several replacements yesterday. CoCa 10.1. Mg and Phos wnl today Renal: SCr stable, CrCl ~7685ml/min Pulm: 2L of [place] Cards: BP elevated and HR ok Hepatobil: LFTs ok, Tbili wnl. Trig good at 102. Neuro: fluoxetine, alprazolam ID: Continues on Zosyn. Afebrile, WBC wnl.  Best Practices: SCDs TPN Access: PICC single lumen 3/2 >> (May get double lumen or a peripheral line for other IVs) TPN start date: Chronic TPN >>  Nutritional Goals (per RD recommendation on 3/3): KCal: 2020-2260 Protein: 100-115 g  Current Nutrition:  NPO Clinimix 5/15 with electrolyte additives Jevity 1.2 at 1540ml/hr (53 g of protein and 1,152 kcal)  [facility] TPN Formula 78 g of protein, 250 g of dextrose, 50 g of lipids (total kcal 1661) Gets TE in home TPN Also gets TFs PTA that meet rest of patient needs  Plan:  Continue Clinimix 5/15 with electrolyte additives cycle. Infuse 960 mL over 12 hrs: 50 mL/hr x 1 hr, then 86 mL/hr x 10 hrs, then 50 mL/hr x 1 hr. Hold IV lipid emulsions while on  TFs Continue Jevity 1.2 at 10640ml/hr TPN + Jevity 1.2 provides 101 g of protein and 1,834 kCals per day meeting 100% of protein needs and ~87% of kCal needs Add MVI in TPN  Add trace elements in TPN every other day, next 3/4 Monitor AM glucose off of SSI for now Monitor TPN labs tomorrow  Give KCl 30mEq x 2 runs today  Enzo BiNathan Alliya Marcon, PharmD, [facility] CenterBCPS Clinical Pharmacist Pager [phone] 04/17/2016 8:05 AM

## 2016-04-17 NOTE — Progress Notes (Signed)
S: Looks good this morning.   Vitals, labs, intake/output, and orders reviewed at this time.  Gen: Alert, no distress H&N: EOMI, atraumatic, neck supple Chest: unlabored respirations, RRR Abd: soft, nontender, nondistended, incision looks great. LUQ GJ with j tube feeds running Ext: warm, no edema Neuro: grossly normal  A/P:  Looks to be doing well from the abdominal standpoint. No changes from gen surg standpoint.    Danny Blakeshelsea Avey Mcmanamon, MD [facility], [place] Pager [phone]

## 2016-04-17 NOTE — Progress Notes (Signed)
[facility]                                                                              Patient Demographics  [name], is a 76 y.o. male, DOB - 09-08-1940, FAO:[id number]  Admit date - 04/15/2016   Admitting Physician Haydee Salter, MD  Outpatient Primary MD for the patient is Lillia Mountain, MD  Outpatient specialists:   LOS - 1  days    Chief Complaint  Patient presents with  . Weakness  . Shortness of Breath       Brief summary    [name] is a 76 y.o. male  past mental history significant for chronic hypoxic respiratory failure, feeding tube, high blood pressure, low back pain, Who presented to ED with complaints of shortness of breath, productive cough for the last 2 days. Patient has dementia at the baseline and history was provided by his wife.    Assessment & Plan    Principal problem Acute hypoxic respiratory failure likely secondary to aspiration and acute diastolic CHF - Chest x-ray showed bilateral pleural effusions with dependent atelectasis/lower lung pneumonia possible fluid overload/CHF - CT chest showed small to moderate bilateral pleural effusions, mild bilateral peribronchial thickening suggesting inflammation/bronchitic changes - Continue duo nebs, placed on IV Zosyn  Active problems Acute diastolic CHF -2-D echo 3/2 showed EF of 50-55%, grade 1 diastolic dysfunction with diffuse hypokinesis  - Placed on strict I's and O's, Negative balance of 2.9 L - Cardiology consulted, Lasix increased to 80mg  q12hrs IV   Elevated troponins - Possibly due to demand ischemia, cardiology following, 2-D echo with EF of 55% with grade 1 diastolic dysfunction, diffuse hypokinesis  Hypokalemia - Replaced  Dysphagia with G-tube feeding, severe malnutrition, hypoalbuminemia/failure to thrive - Albumin 1.9 - Continue Jevity per nutrition - Placed on TPN per pharmacy  Hypernatremia Increased free water flushes  Fungal  infection  Cont Mycostatin 4 times a day  Mood disorder Prozac daily Xanax daily at bedtime  Back pain Continue Lidoderm patches  GERD Cont PPI  Code Status: full  DVT Prophylaxis:   SCD's Family Communication: Discussed in detail with the patient, all imaging results, lab results explained to the patient    Disposition Plan:   Time Spent in minutes 25 minutes  Procedures:   Consultants:   Cardiology  Antimicrobials:   IV Zosyn 3/3   Medications  Scheduled Meds: . Alprazolam  0.5 mg Per Tube QHS,MR X 1  . aspirin  325 mg Oral Daily  . carvedilol  3.125 mg Oral BID WC  . chlorhexidine  15 mL Mouth Rinse BID  . cycloSPORINE  1 drop Both Eyes BID  . feeding supplement (JEVITY 1.2 CAL)  1,000 mL Per Tube Q24H  . FLUoxetine  20 mg Per Tube Daily  . furosemide  80 mg Intravenous BID  . guaiFENesin  10 mL Oral Q8H  . ipratropium-albuterol  3 mL Nebulization TID  . lidocaine  1 patch Transdermal Q24H  . lip balm  1 application Topical BID  . liver oil-zinc oxide   Topical BID  . mouth rinse  15 mL Mouth  Rinse q12n4p  . nystatin  5 mL Per Tube QID  . omeprazole  40 mg Per Tube Daily  . piperacillin-tazobactam (ZOSYN)  IV  3.375 g Intravenous Q8H  . potassium chloride (KCL MULTIRUN) 30 mEq in 265 mL IVPB  30 mEq Intravenous Q4H  . sodium chloride flush  3 mL Intravenous Q12H   Continuous Infusions: . sodium chloride    . TPN (CLINIMIX 5/15) Adult with electrolyte additives     PRN Meds:.albuterol, HYDROcodone-acetaminophen, menthol-cetylpyridinium, nitroGLYCERIN, polyvinyl alcohol, prochlorperazine, sodium chloride flush   Antibiotics   Anti-infectives    Start     Dose/Rate Route Frequency Ordered Stop   04/16/16 1000  piperacillin-tazobactam (ZOSYN) IVPB 3.375 g     3.375 g 12.5 mL/hr over 240 Minutes Intravenous Every 8 hours 04/16/16 0922     04/15/16 1645  azithromycin (ZITHROMAX) 500 mg in dextrose 5 % 250 mL IVPB  Status:  Discontinued     500  mg 250 mL/hr over 60 Minutes Intravenous Every 24 hours 04/15/16 1542 04/16/16 0922   04/15/16 1245  cefTRIAXone (ROCEPHIN) 1 g in dextrose 5 % 50 mL IVPB     1 g 100 mL/hr over 30 Minutes Intravenous  Once 04/15/16 1243 04/15/16 1812        Subjective:   [name] was seen and examined today.  Denies any complaints although states he doesn't feel too good today. Has dementia and difficult to obtain review of systems from the patient.  No fevers this morning. Denies any chest pain.     Objective:   Vitals:   04/16/16 2122 04/17/16 0048 04/17/16 0544 04/17/16 0859  BP: 133/68 (!) 145/82 139/76   Pulse: 72 76 (!) 103   Resp: 20     Temp: 97.8 F (36.6 C) 98.2 F (36.8 C) 98.2 F (36.8 C)   TempSrc: Oral Oral Oral   SpO2: 93% 96% 95% 91%  Weight:   81.8 kg (180 lb 6.4 oz)   Height:        Intake/Output Summary (Last 24 hours) at 04/17/16 1053 Last data filed at 04/17/16 0900  Gross per 24 hour  Intake              455 ml  Output             3426 ml  Net            -2971 ml     Wt Readings from Last 3 Encounters:  04/17/16 81.8 kg (180 lb 6.4 oz)  03/30/16 74 kg (163 lb 2.3 oz)  02/26/16 78.6 kg (173 lb 4.5 oz)     Exam  General: Alert and oriented to self, ill-appearing, frail  HEENT:    Neck: Supple, no JVD  Cardiovascular: S1 S2 clear, RRR  Respiratory: Diminished breath sounds at the bases with rales   Gastrointestinal: Soft, nontender, nondistended, + bowel sounds, J tube  Ext: no cyanosis clubbing, 1+ edema, PICC in left arm  Neuro: no new deficits  Skin: No rashes  Psych: Normal affect and demeanor, alert and oriented x self   Data Reviewed:  I have personally reviewed following labs and imaging studies  Micro Results No results found for this or any previous visit (from the past 240 hour(s)).  Radiology Reports Dg Chest 2 View  Result Date: 04/15/2016 CLINICAL DATA:  Shortness of breath over the last week. EXAM: CHEST  2 VIEW  COMPARISON:  02/28/2016 FINDINGS: Left arm PICC tip in the SVC 3  cm above the right atrium. Bilateral pleural effusions with dependent pulmonary atelectasis and/or pneumonia. Possible fluid overload/ interstitial edema. No acute bone finding. IMPRESSION: Bilateral effusions with dependent atelectasis/lower lung pneumonia. Possible fluid overload/ CHF as well. Electronically Signed   By: Paulina Fusi M.D.   On: 04/15/2016 11:24   Ct Chest Wo Contrast  Result Date: 04/15/2016 CLINICAL DATA:  Acute respiratory failure, dyspnea for a few months EXAM: CT CHEST WITHOUT CONTRAST TECHNIQUE: Multidetector CT imaging of the chest was performed following the standard protocol without IV contrast. COMPARISON:  CXR 04/15/2016 and 02/28/2016 FINDINGS: Cardiovascular: PICC line noted with tip in the distal SVC. Normal size cardiac chambers without pericardial effusion. Coronary arteriosclerosis along the LAD and circumflex. Aortic atherosclerosis. No aortic aneurysm. Mediastinum/Nodes: No supraclavicular, axillary nor mediastinal lymphadenopathy. Normal branch pattern of the great vessels with minimal atherosclerosis at the origins. No thyromegaly or nodules. The mainstem bronchi and trachea are unremarkable apart from some minimal mucous along the dependent aspect of the distal trachea and proximal left mainstem bronchus. Lungs/Pleura: Small to moderate bilateral pleural effusions left greater than right with compressive atelectasis. Trace fluid extends into both major fissures. Centrilobular emphysema. Mild peribronchial thickening consistent with inflammation/ bronchitic change. Upper Abdomen: Partially visualized GJ tube. No acute abdominal abnormality to the extent visualized. Musculoskeletal: No chest wall mass or suspicious bone lesions identified. IMPRESSION: 1. Small to moderate bilateral pleural effusions with adjacent compressive atelectasis. 2. Mild bilateral peribronchial thickening suggesting  inflammation/bronchitic change. 3. Coronary arteriosclerosis and aortic atherosclerosis. Electronically Signed   By: Tollie Eth M.D.   On: 04/15/2016 13:29   Ir Gj Tube Change  Result Date: 03/18/2016 INDICATION: Ruptured gastrostomy balloon. Please perform fluoroscopic guided gastrojejunostomy catheter exchange. EXAM: FLUOROSCOPIC GUIDED REPLACEMENT OF GASTROJEJUNOSTOMY TUBE COMPARISON:  Gastrojejunostomy catheter injection - earlier same day MEDICATIONS: None. CONTRAST:  10 cc Isovue-300 FLUOROSCOPY TIME:  1 minute 6 seconds (4.9 mGy) COMPLICATIONS: None. PROCEDURE: Informed written consent was obtained from the patient after a discussion of the risks and benefits. The upper abdomen and the external portion of the existing gastrojejunostomy tube was prepped and draped in the usual sterile fashion, and a sterile drape was applied covering the operative field. Maximum barrier sterile technique with sterile gowns and gloves were used for the procedure. A timeout was performed prior to the initiation of the procedure. The jejunal lumen of the existing gastrojejunostomy catheter was cannulated with a stiff Glidewire advanced into the proximal small bowel. Under intermittent fluoroscopic guidance, the existing gastric jejunostomy catheter was exchanged for a new 30 French GJ tube with tip ultimately terminating within the proximal small bowel. The gastrostomy balloon was inflated with saline and dilute contrast and pulled taut against the ventral wall of the stomach. The disc was cinched. Contrast injection via the jejunostomy and gastric lumens confirmed appropriate functioning and positioning. A dressing was placed. The patient tolerated procedure well without immediate postprocedural complication. IMPRESSION: Successful fluoroscopic guided replacement of 30 French gastrojejunostomy catheter. The tip of the jejunostomy lumen lies within the proximal jejunum. Both lumens ready for immediate use. Electronically  Signed   By: Simonne Come M.D.   On: 03/18/2016 18:08   Ir Cm Inj Any Colonic Tube W/fluoro  Result Date: 04/13/2016 INDICATION: 76 year old with a GJ feeding tube. The jejunal lumen has been occluded for few days. Patient presents for tube evaluation and possible exchange. EXAM: GI TUBE INJECTION MEDICATIONS: None ANESTHESIA/SEDATION: None CONTRAST:  20 mL Omnipaque 300 - administered into the gastric lumen. FLUOROSCOPY TIME:  Fluoroscopy Time: 30 seconds, 1.4 mGy COMPLICATIONS: None immediate. PROCEDURE: Informed written consent was obtained from the patient's daughter after a thorough discussion of the procedural risks, benefits and alternatives. All questions were addressed. Maximal Sterile Barrier Technique was utilized including caps, mask, sterile gowns, sterile gloves, sterile drape, hand hygiene and skin antiseptic. A timeout was performed prior to the initiation of the procedure. Patient was placed supine on the interventional table. The jejunal lumen was clogged. The lumen was flushed with a 10 cc syringe and was successfully unclogged. Contrast injection confirmed tube placement in the small bowel. The gastric lumen was also injected and confirmed placement in the stomach. Retention balloon remains inflated within the stomach. Patient's heart rate was irregular when he presented. Patient was placed on a monitor and appeared to have frequent premature atrial contractions. This finding was discussed with the patient's PCP, Dr. [name]. Patient was asymptomatic. IMPRESSION: The GJ tube was successfully unclogged. Jejunal tube is appropriately positioned in the small bowel and ready for use. Electronically Signed   By: Richarda Overlie M.D.   On: 04/13/2016 10:26   Ir Cm Inj Any Colonic Tube W/fluoro  Result Date: 03/22/2016 INDICATION: Inadvertent retraction of gastrojejunostomy catheter. EXAM: FLUOROSCOPIC GUIDED INJECTION OF GASTROJEJUNOSTOMY TUBE COMPARISON:  Fluoroscopic guided gastrojejunostomy  catheter exchange - 03/18/2016 MEDICATIONS: None. CONTRAST:  20mL ISOVUE-300 IOPAMIDOL (ISOVUE-300) INJECTION 61% - administered into the stomach and small bowel FLUOROSCOPY TIME:  36 seconds (2 mGy) COMPLICATIONS: None. PROCEDURE: The patient was positioned supine on the fluoroscopy table. Contrast was injected via both the gastrostomy and jejunostomy lumens demonstrating appropriate position functionality of both lumens. The gastrostomy balloon is normally positioned against the ventral wall of the gastric lumen. Both lumens were flushed with a small amount of saline and capped. A dressing was placed. The patient tolerated procedure well without immediate postprocedural complication. IMPRESSION: Appropriately positioned and functioning and gastrojejunostomy catheter. No exchange performed. Electronically Signed   By: Simonne Come M.D.   On: 03/22/2016 17:26   Ir Cm Inj Any Colonic Tube W/fluoro  Result Date: 03/18/2016 INDICATION: History of gastrojejunostomy catheter, now with concern for catheter dislodgement. EXAM: FLUOROSCOPIC GUIDED GASTROJEJUNOSTOMY TUBE INJECTION COMPARISON:  Small bowel series - 03/01/2016 MEDICATIONS: None. CONTRAST:  20 cc Isovue 300, administered via both the gastric and jejunal lumens FLUOROSCOPY TIME:  36 seconds (1 mGy) COMPLICATIONS: None. PROCEDURE: The patient was positioned supine on the fluoroscopy table. A preprocedural spot fluoroscopic image was obtained. Contrast injection of the gastric lumen demonstrates brisk opacification of the stomach lumen. Contrast injection of the gym lumen demonstrates brisk opacification of the small bowel. No evidence of catheter kink, fracture or contrast extravasation. A dressing was placed. The patient tolerated procedure well without immediate postprocedural complication. IMPRESSION: Appropriate positioned and functioning gastrojejunostomy tube. No exchange performed. Electronically Signed   By: Simonne Come M.D.   On: 03/18/2016 13:02    Dg Swallowing Func-speech Pathology  Result Date: 03/25/2016 Objective Swallowing Evaluation: Type of Study: MBS-Modified Barium Swallow Study Patient Details Name: JONERIC STREIGHT MRN: [id number] Date of Birth: 13-Nov-1940 Today's Date: 03/25/2016 Time: SLP Start Time (ACUTE ONLY): 1100-SLP Stop Time (ACUTE ONLY): 1130 SLP Time Calculation (min) (ACUTE ONLY): 30 min Past Medical History: Past Medical History: Diagnosis Date . Aspiration pneumonia (HCC) 01/17/2016 . Bilateral dry eyes  . Bile peritonitis due to gastric tube dislodgement 12/03/2015 . BPH (benign prostatic hyperplasia)  . Central pterygium of right eye  . Chronic respiratory failure with hypoxia (HCC)  . Coronary  artery disease 1997, 2009  bare mental stent right coronart artery, occlusive followed by Dr. Garnette Scheuermann in [place] . Degenerative disc disease   LOWER BACK . Dysphonia  . ED (erectile dysfunction)  . Gastric outlet obstruction 11/24/2015 . Gastroesophageal reflux disease  . Hyperlipidemia  . Hyperopia of both eyes with astigmatism and presbyopia  . Hypertension  . Ischemic right index finger at tip 12/09/2015 . Nuclear senile cataract   Dr. [facility] in [place] . Peptic ulcer  . Pneumatosis of intestines 12/11/2015 . Pneumonia   HISTORY OF IN CHILDHOOD . Posterior vitreous detachment 10/30/2012 . Pyloric stricture 11/24/2015 . Salzmann's nodular dystrophy of left eye  . SBO (small bowel obstruction) 01/20/2016 . Urinary hesitancy  . Wears glasses  Past Surgical History: Past Surgical History: Procedure Laterality Date . CARDIAC CATHETERIZATION   . CHOLECYSTECTOMY   . ESOPHAGOGASTRODUODENOSCOPY N/A 12/01/2015  Procedure: ESOPHAGOGASTRODUODENOSCOPY (EGD) BALLOON DILATION OF DUODENAL STRICTURE;  Surgeon: Karie Soda, MD;  Location: [facility];  Service: General;  Laterality: N/A; . ESOPHAGOGASTRODUODENOSCOPY N/A 12/14/2015  Procedure: ESOPHAGOGASTRODUODENOSCOPY (EGD);  Surgeon: Vida Rigger, MD;  Location: [facility];  Service:  Endoscopy;  Laterality: N/A;  Patient has tracheostomy . ESOPHAGOGASTRODUODENOSCOPY (EGD) WITH PROPOFOL N/A 07/01/2015  Procedure: ESOPHAGOGASTRODUODENOSCOPY (EGD) WITH PROPOFOL;  Surgeon: Willis Modena, MD;  Location: [facility];  Service: Endoscopy;  Laterality: N/A; . ESOPHAGOGASTRODUODENOSCOPY (EGD) WITH PROPOFOL N/A 11/23/2015  Procedure: ESOPHAGOGASTRODUODENOSCOPY (EGD) WITH PROPOFOL;  Surgeon: Willis Modena, MD;  Location: [facility];  Service: Endoscopy;  Laterality: N/A;  may need to intubate . EYE SURGERY    growth on right eye removed  . IR GENERIC HISTORICAL  12/13/2015  IR REPLC DUODEN/JEJUNO TUBE PERCUT W/FLUORO 12/13/2015 [name], MD [facility] . IR GENERIC HISTORICAL  02/03/2016  IR REPLC GASTRO/COLONIC TUBE PERCUT W/FLUORO 02/03/2016 Berdine Dance, MD [facility] . IR GENERIC HISTORICAL  02/04/2016  IR GASTR TUBE CONVERT GASTR-JEJ PER W/FL MOD SED 02/04/2016 Malachy Moan, MD [facility] . IR GENERIC HISTORICAL  02/25/2016  IR SINUS/FIST TUBE CHK-NON GI 02/25/2016 Irish Lack, MD [facility] . IR GENERIC HISTORICAL  03/18/2016  IR CM INJ ANY COLONIC TUBE W/FLUORO 03/18/2016 Simonne Come, MD [facility] . IR GENERIC HISTORICAL  03/18/2016  IR GJ TUBE CHANGE 03/18/2016 Simonne Come, MD [facility] . IR GENERIC HISTORICAL  03/22/2016  IR CM INJ ANY COLONIC TUBE W/FLUORO 03/22/2016 Simonne Come, MD [facility] . LAPAROSCOPIC GASTROSTOMY N/A 12/01/2015  Procedure: LAPAROSCOPIC PLACEMENT OF FEEDING JEJUNOSTOMY AND GASTROSTOMY TUBE;  Surgeon: Karie Soda, MD;  Location: [facility];  Service: General;  Laterality: N/A; . LAPAROSCOPY N/A 12/03/2015  Procedure: LAPAROSCOPY DIAGNOSTIC, OMENTOPEXY, JEJUNOSTOMY, WASH OUT;  Surgeon: Karie Soda, MD;  Location: [facility];  Service: General;  Laterality: N/A; . LAPAROTOMY N/A 02/06/2016  Procedure: EXPLORATORY LAPAROTOMY, LYSIS OF ADHESIONS, SEGMENTAL SMALL BOWEL RESECTION, GASTROJEJUNOSTOMY;  Surgeon: Chevis Pretty III, MD;  Location: [facility];  Service: General;   Laterality: N/A; . STENT TO HEART    2 1997 AND 1 IN 1996 . TONSILLECTOMY   . XI ROBOTIC VAGOTOMY AND ANTRECTOMY N/A 09/25/2015  Procedure: XI ROBOTIC ANTERIOR AND POSTERIOR VAGOTOMY, BILROTH I  ANASTOMOSIS DOR FUNDIPLICATION, OMENTOPEXY  UPPER ENDOSCOPY;  Surgeon: Karie Soda, MD;  Location: [facility];  Service: General;  Laterality: N/A; HPI: [name] is a 76 y.o. male with h/o CAD and GERD who presents to the Emergency Department from [facility] EMS complaining of nausea, vomiting, and diarrhea onset onset two days ago, worsening yesterday.  Pt also has associated mild abdominal pain. Per daughter, pt has been admitted to the hospital recently for surgery in his abdomen; he has been staying at [facility] since then. Daughter reported to MD that pt has been fed through a G tube which gave him diarrhea that eventually resolved with a change in formula.   Daughter also informed MD that pt gradually started feeding by mouth again x2 days with return of diarrhea along with nausea and vomiting. Daughter notes that the facility staff have tried to change his formula but with no relief to his symptoms.  Pt has known h/o dysphagia dysphonia, pna, peptic ulcer, GERD.  Last MBS done 12/29/15 showed moderate oropharyngeal dysphagia that looked marginally worse than test ono 12/17/15.  Pt left on ice chips only after oral care.  CXR 12/6 showed LLL consolidation ? ATX or pna.   Subjective: pt awake in chair Assessment / Plan / Recommendation CHL IP CLINICAL IMPRESSIONS 03/25/2016 Therapy Diagnosis Moderate pharyngeal phase dysphagia Clinical Impression Pt presents with moderate sensorimotor pharyngeal dysphagia resulting in silent aspiration of nectar thick liquids. Swallow function continues to be delayed and weak complicated by very weak cough mechanism. This results in delayed swallow initiaiton to the valluclua, intermittent flash penetration, one occurance of shallow frank penetration after the  swallow and significant residue at the pyriform sinuses that was silently aspirated after the swallow with nectar thick liquids. Pt unable to clear pharyngeal residue with cued cough or double swallow. After 4 nectar thick boluses, pt gagged  and hocked up pharyngeal residue. Two trials of thin liquids were presented via spoon with what appeared to be timely swallow initiation. Safety with thin liquids was difficult to assess as pt began to immediately excessively wretch. Trials were discontinued as excessive wretching continued. Pt didn't produce any vomitous. These same wretching behaviors are present in ST session after consuming 2 to 3 tsp trials of thin liquids. If wretching had not taken place, swallow study could have continued with further trials of thin liquids. Pt continues to be at high risk for aspiration complicated by GI issues that are present after ~ 6 tsp boluses of liquids. As a results, I recommend that pt remain NPO with small sips of thin H2O allowed under free water protocol as pt tolerates. Pt would also benefit from respiratory muscle training to strengthen cough and speech intensity. Education provided to PA. Of note, pt with wretching during ST session after only 3 to 4 tsp trials. Pt appears to have a gastrointestinal component that will affect his ability to return to PO intake for nutrition.  Impact on safety and function Risk for inadequate nutrition/hydration;Moderate aspiration risk   CHL IP TREATMENT RECOMMENDATION 03/25/2016 Treatment Recommendations Therapy as outlined in treatment plan below   Prognosis 03/25/2016 Prognosis for Safe Diet Advancement Guarded Barriers to Reach Goals Cognitive deficits;Time post onset;Severity of deficits;Other (Comment) Barriers/Prognosis Comment -- CHL IP DIET RECOMMENDATION 03/25/2016 SLP Diet Recommendations Free water protocol after oral care Liquid Administration via Spoon Medication Administration Via alternative means Compensations Small sips/bites  Postural Changes Remain semi-upright after after feeds/meals (Comment);Seated upright at 90 degrees   CHL IP OTHER RECOMMENDATIONS 03/25/2016 Recommended Consults Consider GI evaluation Oral Care Recommendations Oral care QID Other Recommendations Have oral suction available   CHL IP FOLLOW UP RECOMMENDATIONS 03/25/2016 Follow up Recommendations Skilled Nursing facility   [facility] IP FREQUENCY AND DURATION 01/25/2016 Speech Therapy Frequency (ACUTE ONLY) min 2x/week Treatment Duration 1 week      CHL  IP ORAL PHASE 03/25/2016 Oral Phase WFL Oral - Pudding Teaspoon -- Oral - Pudding Cup -- Oral - Honey Teaspoon -- Oral - Honey Cup -- Oral - Nectar Teaspoon WFL Oral - Nectar Cup WFL Oral - Nectar Straw NT Oral - Thin Teaspoon WFL Oral - Thin Cup WFL Oral - Thin Straw NT Oral - Puree NT Oral - Mech Soft NT Oral - Regular -- Oral - Multi-Consistency -- Oral - Pill -- Oral Phase - Comment --  CHL IP PHARYNGEAL PHASE 03/25/2016 Pharyngeal Phase Impaired Pharyngeal- Pudding Teaspoon -- Pharyngeal -- Pharyngeal- Pudding Cup -- Pharyngeal -- Pharyngeal- Honey Teaspoon -- Pharyngeal -- Pharyngeal- Honey Cup -- Pharyngeal -- Pharyngeal- Nectar Teaspoon Delayed swallow initiation-vallecula;Penetration/Apiration after swallow;Significant aspiration (Amount);Pharyngeal residue - pyriform;Pharyngeal residue - valleculae;Pharyngeal residue - posterior pharnyx;Reduced pharyngeal peristalsis Pharyngeal Material enters airway, passes BELOW cords without attempt by patient to eject out (silent aspiration) Pharyngeal- Nectar Cup Delayed swallow initiation-vallecula;Reduced pharyngeal peristalsis;Penetration/Apiration after swallow;Significant aspiration (Amount);Pharyngeal residue - pyriform;Pharyngeal residue - valleculae;Pharyngeal residue - posterior pharnyx;Compensatory strategies attempted (with notebox) Pharyngeal Material enters airway, passes BELOW cords without attempt by patient to eject out (silent aspiration) Pharyngeal- Nectar Straw NT  Pharyngeal -- Pharyngeal- Thin Teaspoon Delayed swallow initiation-vallecula;Pharyngeal residue - pyriform Pharyngeal Material enters airway, remains ABOVE vocal cords then ejected out Pharyngeal- Thin Cup NT Pharyngeal -- Pharyngeal- Thin Straw NT Pharyngeal -- Pharyngeal- Puree NT Pharyngeal -- Pharyngeal- Mechanical Soft -- Pharyngeal -- Pharyngeal- Regular NT Pharyngeal -- Pharyngeal- Multi-consistency -- Pharyngeal -- Pharyngeal- Pill -- Pharyngeal -- Pharyngeal Comment --  CHL IP CERVICAL ESOPHAGEAL PHASE 01/25/2016 Cervical Esophageal Phase Impaired Pudding Teaspoon -- Pudding Cup -- Honey Teaspoon -- Honey Cup -- Nectar Teaspoon Reduced cricopharyngeal relaxation Nectar Cup Reduced cricopharyngeal relaxation Nectar Straw -- Thin Teaspoon Reduced cricopharyngeal relaxation Thin Cup Reduced cricopharyngeal relaxation Thin Straw Reduced cricopharyngeal relaxation Puree Reduced cricopharyngeal relaxation;Esophageal backflow into cervical esophagus Mechanical Soft -- Regular -- Multi-consistency -- Pill -- Cervical Esophageal Comment -- No flowsheet data found. Happi B. Dreama Saa, M.S., CCC-SLP Speech-Language Pathologist Happi Overton 03/25/2016, 12:53 PM               Lab Data:  CBC:  Recent Labs Lab 04/15/16 1031 04/16/16 0441 04/17/16 0444  WBC 7.5 8.2 8.6  NEUTROABS 4.8  --  5.4  HGB 10.2* 10.2* 10.5*  HCT 32.4* 33.0* 33.7*  MCV 94.2 94.0 95.2  PLT 276 295 309   Basic Metabolic Panel:  Recent Labs Lab 04/15/16 1218  04/15/16 1950 04/15/16 2206 04/16/16 0157 04/16/16 0441 04/17/16 0444  NA  --   < > 148* 148* 147* 147* 145  K  --   < > 3.3* 3.0* 3.4* 3.2* 3.7  CL  --   < > 111 110 111 111 107  CO2  --   < > 29 28 30 30  34*  GLUCOSE  --   < > 112* 108* 112* 117* 109*  BUN  --   < > 23* 22* 20 21* 21*  CREATININE  --   < > 0.81 0.79 0.76 0.81 0.93  CALCIUM  --   < > 8.6* 8.5* 8.5* 8.5* 8.5*  MG 1.7  --   --   --   --   --  2.0  PHOS 3.4  --   --   --   --   --  4.4  < > =  values in this interval not displayed. GFR: Estimated Creatinine Clearance: 74.2 mL/min (by C-G formula based on SCr of  0.93 mg/dL). Liver Function Tests:  Recent Labs Lab 04/15/16 1031 04/17/16 0444  AST 23 29  ALT 22 23  ALKPHOS 113 120  BILITOT 0.3 0.4  PROT 6.2* 6.5  ALBUMIN 2.0* 1.9*   No results for input(s): LIPASE, AMYLASE in the last 168 hours. No results for input(s): AMMONIA in the last 168 hours. Coagulation Profile: No results for input(s): INR, PROTIME in the last 168 hours. Cardiac Enzymes:  Recent Labs Lab 04/15/16 1031 04/15/16 1400 04/15/16 1950 04/16/16 0157  TROPONINI 0.07* 0.06* 0.06* 0.06*   BNP (last 3 results) No results for input(s): PROBNP in the last 8760 hours. HbA1C: No results for input(s): HGBA1C in the last 72 hours. CBG:  Recent Labs Lab 04/16/16 2110  GLUCAP 112*   Lipid Profile:  Recent Labs  04/17/16 0444  TRIG 102   Thyroid Function Tests: No results for input(s): TSH, T4TOTAL, FREET4, T3FREE, THYROIDAB in the last 72 hours. Anemia Panel: No results for input(s): VITAMINB12, FOLATE, FERRITIN, TIBC, IRON, RETICCTPCT in the last 72 hours. Urine analysis:    Component Value Date/Time   COLORURINE YELLOW 02/28/2016 1036   APPEARANCEUR CLEAR 02/28/2016 1036   LABSPEC 1.018 02/28/2016 1036   PHURINE 5.0 02/28/2016 1036   GLUCOSEU NEGATIVE 02/28/2016 1036   HGBUR NEGATIVE 02/28/2016 1036   BILIRUBINUR NEGATIVE 02/28/2016 1036   KETONESUR NEGATIVE 02/28/2016 1036   PROTEINUR NEGATIVE 02/28/2016 1036   NITRITE NEGATIVE 02/28/2016 1036   LEUKOCYTESUR NEGATIVE 02/28/2016 1036     Jacquelynn Friend M.D. [facility] 04/17/2016, 10:53 AM  Pager: [phone] Between 7am to 7pm - call Pager - [phone]  After 7pm go to [email or web address] - password [email or web address]  Call night coverage person covering after 7pm

## 2016-04-17 NOTE — Progress Notes (Signed)
Progress Note  Patient Name: Danny GalRalph T Vitelli Date of Encounter: 04/17/2016  Primary Cardiologist:   Subjective   The patient feels tired, weak. Diuresed well with 2.9L out.  Inpatient Medications    Scheduled Meds: . Alprazolam  0.5 mg Per Tube QHS,MR X 1  . aspirin  325 mg Oral Daily  . carvedilol  3.125 mg Oral BID WC  . chlorhexidine  15 mL Mouth Rinse BID  . cycloSPORINE  1 drop Both Eyes BID  . feeding supplement (JEVITY 1.2 CAL)  1,000 mL Per Tube Q24H  . FLUoxetine  20 mg Per Tube Daily  . furosemide  80 mg Intravenous BID  . guaiFENesin  10 mL Oral Q8H  . ipratropium-albuterol  3 mL Nebulization TID  . lidocaine  1 patch Transdermal Q24H  . lip balm  1 application Topical BID  . liver oil-zinc oxide   Topical BID  . mouth rinse  15 mL Mouth Rinse q12n4p  . nystatin  5 mL Per Tube QID  . omeprazole  40 mg Per Tube Daily  . piperacillin-tazobactam (ZOSYN)  IV  3.375 g Intravenous Q8H  . potassium chloride (KCL MULTIRUN) 30 mEq in 265 mL IVPB  30 mEq Intravenous Q4H  . sodium chloride flush  3 mL Intravenous Q12H   Continuous Infusions: . sodium chloride    . TPN (CLINIMIX 5/15) Adult with electrolyte additives     PRN Meds: albuterol, HYDROcodone-acetaminophen, menthol-cetylpyridinium, nitroGLYCERIN, polyvinyl alcohol, prochlorperazine, sodium chloride flush   Vital Signs    Vitals:   04/16/16 2122 04/17/16 0048 04/17/16 0544 04/17/16 0859  BP: 133/68 (!) 145/82 139/76   Pulse: 72 76 (!) 103   Resp: 20     Temp: 97.8 F (36.6 C) 98.2 F (36.8 C) 98.2 F (36.8 C)   TempSrc: Oral Oral Oral   SpO2: 93% 96% 95% 91%  Weight:   180 lb 6.4 oz (81.8 kg)   Height:        Intake/Output Summary (Last 24 hours) at 04/17/16 1226 Last data filed at 04/17/16 0900  Gross per 24 hour  Intake              455 ml  Output             3426 ml  Net            -2971 ml   Filed Weights   04/15/16 1536 04/16/16 0509 04/17/16 0544  Weight: 192 lb 6.4 oz (87.3 kg) 177  lb 14.4 oz (80.7 kg) 180 lb 6.4 oz (81.8 kg)    Telemetry    SR, 1 episode of pSVT - 14 beats, 1 episode of nsVT - 16 beats, asymptomatic - Personally Reviewed   Physical Exam   GEN: No acute distress.   Neck: No JVD Cardiac: RRR, no murmurs, rubs, or gallops.  Respiratory: Clear to auscultation bilaterally. GI: Soft, nontender, non-distended  MS: No edema; No deformity. Neuro:  Nonfocal  Psych: Normal affect   Labs    Chemistry Recent Labs Lab 04/15/16 1031  04/16/16 0157 04/16/16 0441 04/17/16 0444  NA 150*  < > 147* 147* 145  K 3.0*  < > 3.4* 3.2* 3.7  CL 115*  < > 111 111 107  CO2 26  < > 30 30 34*  GLUCOSE 106*  < > 112* 117* 109*  BUN 25*  < > 20 21* 21*  CREATININE 0.75  < > 0.76 0.81 0.93  CALCIUM 8.4*  < >  8.5* 8.5* 8.5*  PROT 6.2*  --   --   --  6.5  ALBUMIN 2.0*  --   --   --  1.9*  AST 23  --   --   --  29  ALT 22  --   --   --  23  ALKPHOS 113  --   --   --  120  BILITOT 0.3  --   --   --  0.4  GFRNONAA >60  < > >60 >60 >60  GFRAA >60  < > >60 >60 >60  ANIONGAP 9  < > 6 6 4*  < > = values in this interval not displayed.   Hematology  Recent Labs Lab 04/15/16 1031 04/16/16 0441 04/17/16 0444  WBC 7.5 8.2 8.6  RBC 3.44* 3.51* 3.54*  HGB 10.2* 10.2* 10.5*  HCT 32.4* 33.0* 33.7*  MCV 94.2 94.0 95.2  MCH 29.7 29.1 29.7  MCHC 31.5 30.9 31.2  RDW 15.8* 15.6* 15.9*  PLT 276 295 309    Cardiac Enzymes  Recent Labs Lab 04/15/16 1031 04/15/16 1400 04/15/16 1950 04/16/16 0157  TROPONINI 0.07* 0.06* 0.06* 0.06*   No results for input(s): TROPIPOC in the last 168 hours.   BNP  Recent Labs Lab 04/15/16 1031  BNP 1,832.6*     DDimer No results for input(s): DDIMER in the last 168 hours.   Radiology    Ct Chest Wo Contrast  Result Date: 04/15/2016 CLINICAL DATA:  Acute respiratory failure, dyspnea for a few months EXAM: CT CHEST WITHOUT CONTRAST TECHNIQUE: Multidetector CT imaging of the chest was performed following the standard  protocol without IV contrast. COMPARISON:  CXR 04/15/2016 and 02/28/2016 FINDINGS: Cardiovascular: PICC line noted with tip in the distal SVC. Normal size cardiac chambers without pericardial effusion. Coronary arteriosclerosis along the LAD and circumflex. Aortic atherosclerosis. No aortic aneurysm. Mediastinum/Nodes: No supraclavicular, axillary nor mediastinal lymphadenopathy. Normal branch pattern of the great vessels with minimal atherosclerosis at the origins. No thyromegaly or nodules. The mainstem bronchi and trachea are unremarkable apart from some minimal mucous along the dependent aspect of the distal trachea and proximal left mainstem bronchus. Lungs/Pleura: Small to moderate bilateral pleural effusions left greater than right with compressive atelectasis. Trace fluid extends into both major fissures. Centrilobular emphysema. Mild peribronchial thickening consistent with inflammation/ bronchitic change. Upper Abdomen: Partially visualized GJ tube. No acute abdominal abnormality to the extent visualized. Musculoskeletal: No chest wall mass or suspicious bone lesions identified. IMPRESSION: 1. Small to moderate bilateral pleural effusions with adjacent compressive atelectasis. 2. Mild bilateral peribronchial thickening suggesting inflammation/bronchitic change. 3. Coronary arteriosclerosis and aortic atherosclerosis. Electronically Signed   By: Tollie Eth M.D.   On: 04/15/2016 13:29    Cardiac Studies   - Left ventricle: The cavity size was normal. Wall thickness was   increased in a pattern of mild LVH. Systolic function was normal.   The estimated ejection fraction was in the range of 50% to 55%.   Diffuse hypokinesis. Doppler parameters are consistent with   abnormal left ventricular relaxation (grade 1 diastolic   dysfunction). - Aortic valve: There was mild regurgitation.  Assessment & Plan    Principal Problem:   Respiratory failure, acute (HCC) Active Problems:   Essential  hypertension, benign   Hyperlipidemia   Anxiety state   Tobacco abuse   Hypernatremia   Hypokalemia   Elevated troponin  1. Acute on chronic diastolic heart failure - BNP 1832. Echo this admission showed normal LV systolic  function; grade 1 diastolic function; mild AI. His albumin level in 2. Diuresing well with IV lasix out 2.9L net.  Continue supplemental potassium. Strict I & O and daily weight. Increase lasix to 80 mg iv BID. Creatinine remains stable.  2. Elevated troponin - Flat trend. Dement in setting of acute illness and CHF.   3. CAD s/p BMS to RCA in 1997 and DES to LAD in 2000 - No angina.  Last Myoview 04/2013 normal.   4. HTN - Elevated BP. IV PRN hydralazine.   5. Dysphagia -  On feeding tube  6. NsVT, pSVT - increase coreg to 6.25 mg BID  Signed, Shaunda Tipping Jorja Loa, MD  04/17/2016, 12:26 PM

## 2016-04-18 LAB — COMPREHENSIVE METABOLIC PANEL
ALBUMIN: 1.9 g/dL — AB (ref 3.5–5.0)
ALK PHOS: 117 U/L (ref 38–126)
ALT: 22 U/L (ref 17–63)
ANION GAP: 5 (ref 5–15)
AST: 27 U/L (ref 15–41)
BILIRUBIN TOTAL: 0.3 mg/dL (ref 0.3–1.2)
BUN: 23 mg/dL — AB (ref 6–20)
CALCIUM: 8.4 mg/dL — AB (ref 8.9–10.3)
CO2: 35 mmol/L — ABNORMAL HIGH (ref 22–32)
Chloride: 100 mmol/L — ABNORMAL LOW (ref 101–111)
Creatinine, Ser: 1.02 mg/dL (ref 0.61–1.24)
GFR calc Af Amer: >60 mL/min (ref >60)
GFR calc non Af Amer: >60 mL/min (ref >60)
GLUCOSE: 125 mg/dL — AB (ref 65–99)
Potassium: 3.8 mmol/L (ref 3.5–5.1)
Sodium: 140 mmol/L (ref 135–145)
TOTAL PROTEIN: 6.8 g/dL (ref 6.5–8.1)

## 2016-04-18 LAB — TRIGLYCERIDES: Triglycerides: 124 mg/dL (ref <150)

## 2016-04-18 LAB — CBC
HEMATOCRIT: 33.9 % — AB (ref 39.0–52.0)
HEMOGLOBIN: 10.8 g/dL — AB (ref 13.0–17.0)
MCH: 29.9 pg (ref 26.0–34.0)
MCHC: 31.9 g/dL (ref 30.0–36.0)
MCV: 93.9 fL (ref 78.0–100.0)
Platelets: 306 10*3/uL (ref 150–400)
RBC: 3.61 MIL/uL — ABNORMAL LOW (ref 4.22–5.81)
RDW: 15.5 % (ref 11.5–15.5)
WBC: 8.2 10*3/uL (ref 4.0–10.5)

## 2016-04-18 LAB — DIFFERENTIAL
BASOS ABS: 0 10*3/uL (ref 0.0–0.1)
BASOS PCT: 1 %
EOS ABS: 0.3 10*3/uL (ref 0.0–0.7)
Eosinophils Relative: 4 %
Lymphocytes Relative: 39 %
Lymphs Abs: 3.2 10*3/uL (ref 0.7–4.0)
MONOS PCT: 9 %
Monocytes Absolute: 0.7 10*3/uL (ref 0.1–1.0)
NEUTROS PCT: 49 %
Neutro Abs: 4 10*3/uL (ref 1.7–7.7)

## 2016-04-18 LAB — PREALBUMIN: PREALBUMIN: 19.2 mg/dL (ref 18–38)

## 2016-04-18 LAB — MAGNESIUM: MAGNESIUM: 1.9 mg/dL (ref 1.7–2.4)

## 2016-04-18 LAB — PHOSPHORUS: Phosphorus: 5 mg/dL — ABNORMAL HIGH (ref 2.5–4.6)

## 2016-04-18 MED ORDER — JEVITY 1.5 CAL/FIBER PO LIQD
1000.0000 mL | ORAL | Status: DC
  Administered 2016-04-18 (×2): 1000 mL
  Filled 2016-04-18 (×2): qty 1000

## 2016-04-18 MED ORDER — ADULT MULTIVITAMIN LIQUID CH
15.0000 mL | Freq: Every day | ORAL | Status: DC
  Administered 2016-04-18 – 2016-04-22 (×5): 15 mL
  Filled 2016-04-18 (×5): qty 15

## 2016-04-18 MED ORDER — FAT EMULSION 20 % IV EMUL
96.0000 mL | INTRAVENOUS | Status: AC
  Administered 2016-04-18: 96 mL via INTRAVENOUS
  Filled 2016-04-18: qty 100

## 2016-04-18 MED ORDER — CLINIMIX/DEXTROSE (5/15) 5 % IV SOLN
INTRAVENOUS | Status: AC
  Administered 2016-04-18: 18:00:00 via INTRAVENOUS
  Filled 2016-04-18: qty 1000

## 2016-04-18 MED ORDER — POTASSIUM CHLORIDE 20 MEQ/15ML (10%) PO SOLN
30.0000 meq | Freq: Once | ORAL | Status: AC
  Administered 2016-04-18: 30 meq
  Filled 2016-04-18: qty 30

## 2016-04-18 NOTE — Progress Notes (Signed)
Progress Note  Patient Name: [name] Date of Encounter: 04/18/2016  Primary Cardiologist: Dr Katrinka BlazingSmith 04/2013  Patient Profile     76 y.o. male w/ hx BMS RCA 1997 and LAD DES 2000, HTN, HLD, chronic respiratory failure, recent complex GI procedure, has ostomy and feeding tube D/C from inpatient rehab 03/30/16. Admitted 03/02 w/ dyspnea x 4 days.  Subjective   Breathing better, no chest pain or palpitations  Inpatient Medications    Scheduled Meds: . Alprazolam  0.5 mg Per Tube QHS,MR X 1  . aspirin  325 mg Oral Daily  . carvedilol  6.25 mg Oral BID WC  . chlorhexidine  15 mL Mouth Rinse BID  . cycloSPORINE  1 drop Both Eyes BID  . FLUoxetine  20 mg Per Tube Daily  . furosemide  80 mg Intravenous BID AC  . guaiFENesin  10 mL Oral Q8H  . ipratropium-albuterol  3 mL Nebulization TID  . lidocaine  1 patch Transdermal Q24H  . lip balm  1 application Topical BID  . liver oil-zinc oxide   Topical BID  . mouth rinse  15 mL Mouth Rinse q12n4p  . multivitamin  15 mL Per Tube Daily  . nystatin  5 mL Per Tube QID  . omeprazole  40 mg Per Tube Daily  . piperacillin-tazobactam (ZOSYN)  IV  3.375 g Intravenous Q8H  . potassium chloride  30 mEq Per Tube Once  . sodium chloride flush  3 mL Intravenous Q12H   Continuous Infusions: . sodium chloride    . TPN (CLINIMIX) Adult without lytes     And  . fat emulsion    . feeding supplement (JEVITY 1.5 CAL/FIBER)     PRN Meds: albuterol, HYDROcodone-acetaminophen, menthol-cetylpyridinium, nitroGLYCERIN, polyvinyl alcohol, prochlorperazine, sodium chloride flush   Vital Signs    Vitals:   04/17/16 1450 04/17/16 2135 04/18/16 0550 04/18/16 0804  BP:  138/72 128/65   Pulse:  82 91   Resp:  20 18   Temp:  97.7 F (36.5 C) 97.9 F (36.6 C)   TempSrc:  Oral Oral   SpO2: 93% 93% 95% 96%  Weight:   174 lb 6.4 oz (79.1 kg)   Height:        Intake/Output Summary (Last 24 hours) at 04/18/16 0930 Last data filed at 04/18/16 0600  Gross per 24 hour  Intake          2347.67 ml  Output             5400 ml  Net         -3052.33 ml   Filed Weights   04/16/16 0509 04/17/16 0544 04/18/16 0550  Weight: 177 lb 14.4 oz (80.7 kg) 180 lb 6.4 oz (81.8 kg) 174 lb 6.4 oz (79.1 kg)    Telemetry    SR, episodes of ?SVT vs atrial tach - Personally Reviewed  ECG    n/a - Personally Reviewed  Physical Exam   General: elderly male, chronically ill-appearing, in no acute distress. Head: Normocephalic, atraumatic.  Neck: Supple without bruits, JVD minimal elevation. Lungs:  Resp regular and unlabored, decreased BS bases. Heart: Irreg R&R, S1, S2, no S3, S4, soft murmur; no rub. Abdomen: Soft, non-tender, non-distended with normoactive bowel sounds. No hepatomegaly. No rebound/guarding. No obvious abdominal masses. Extremities: No clubbing, cyanosis, no edema. Distal pedal pulses are 2+ bilaterally. Neuro: Alert and oriented X 3. Moves all extremities spontaneously. Psych: Normal affect.  Labs    Hematology Recent Labs Lab  04/16/16 0441 04/17/16 0444 04/18/16 0447  WBC 8.2 8.6 8.2  RBC 3.51* 3.54* 3.61*  HGB 10.2* 10.5* 10.8*  HCT 33.0* 33.7* 33.9*  MCV 94.0 95.2 93.9  MCH 29.1 29.7 29.9  MCHC 30.9 31.2 31.9  RDW 15.6* 15.9* 15.5  PLT 295 309 306    Chemistry Recent Labs Lab 04/15/16 1031  04/16/16 0441 04/17/16 0444 04/18/16 0447  NA 150*  < > 147* 145 140  K 3.0*  < > 3.2* 3.7 3.8  CL 115*  < > 111 107 100*  CO2 26  < > 30 34* 35*  GLUCOSE 106*  < > 117* 109* 125*  BUN 25*  < > 21* 21* 23*  CREATININE 0.75  < > 0.81 0.93 1.02  CALCIUM 8.4*  < > 8.5* 8.5* 8.4*  PROT 6.2*  --   --  6.5 6.8  ALBUMIN 2.0*  --   --  1.9* 1.9*  AST 23  --   --  29 27  ALT 22  --   --  23 22  ALKPHOS 113  --   --  120 117  BILITOT 0.3  --   --  0.4 0.3  GFRNONAA >60  < > >60 >60 >60  GFRAA >60  < > >60 >60 >60  ANIONGAP 9  < > 6 4* 5  < > = values in this interval not displayed.   Cardiac Enzymes Recent  Labs Lab 04/15/16 1031 04/15/16 1400 04/15/16 1950 04/16/16 0157  TROPONINI 0.07* 0.06* 0.06* 0.06*    BNP Recent Labs Lab 04/15/16 1031  BNP 1,832.6*     Radiology    Dg Chest 2 View Result Date: 04/15/2016 CLINICAL DATA:  Shortness of breath over the last week. EXAM: CHEST  2 VIEW COMPARISON:  02/28/2016 FINDINGS: Left arm PICC tip in the SVC 3 cm above the right atrium. Bilateral pleural effusions with dependent pulmonary atelectasis and/or pneumonia. Possible fluid overload/ interstitial edema. No acute bone finding. IMPRESSION: Bilateral effusions with dependent atelectasis/lower lung pneumonia. Possible fluid overload/ CHF as well. Electronically Signed   By: Paulina Fusi M.D.   On: 04/15/2016 11:24   Ct Chest Wo Contrast Result Date: 04/15/2016 CLINICAL DATA:  Acute respiratory failure, dyspnea for a few months EXAM: CT CHEST WITHOUT CONTRAST TECHNIQUE: Multidetector CT imaging of the chest was performed following the standard protocol without IV contrast. COMPARISON:  CXR 04/15/2016 and 02/28/2016 FINDINGS: Cardiovascular: PICC line noted with tip in the distal SVC. Normal size cardiac chambers without pericardial effusion. Coronary arteriosclerosis along the LAD and circumflex. Aortic atherosclerosis. No aortic aneurysm. Mediastinum/Nodes: No supraclavicular, axillary nor mediastinal lymphadenopathy. Normal branch pattern of the great vessels with minimal atherosclerosis at the origins. No thyromegaly or nodules. The mainstem bronchi and trachea are unremarkable apart from some minimal mucous along the dependent aspect of the distal trachea and proximal left mainstem bronchus. Lungs/Pleura: Small to moderate bilateral pleural effusions left greater than right with compressive atelectasis. Trace fluid extends into both major fissures. Centrilobular emphysema. Mild peribronchial thickening consistent with inflammation/ bronchitic change. Upper Abdomen: Partially visualized GJ tube. No acute  abdominal abnormality to the extent visualized. Musculoskeletal: No chest wall mass or suspicious bone lesions identified. IMPRESSION: 1. Small to moderate bilateral pleural effusions with adjacent compressive atelectasis. 2. Mild bilateral peribronchial thickening suggesting inflammation/bronchitic change. 3. Coronary arteriosclerosis and aortic atherosclerosis. Electronically Signed   By: Tollie Eth M.D.   On: 04/15/2016 13:29     Cardiac Studies   ECHO:  04/15/2016 - Left ventricle: The cavity size was normal. Wall thickness was   increased in a pattern of mild LVH. Systolic function was normal.   The estimated ejection fraction was in the range of 50% to 55%.   Diffuse hypokinesis. Doppler parameters are consistent with   abnormal left ventricular relaxation (grade 1 diastolic dysfunction). - Aortic valve: There was mild regurgitation. Impressions: - Extremely limited; definity used; normal LV systolic function;   grade 1 diastolic function; mild AI.   CATH: n/a   Patient Profile     76 y.o. male w/ hx BMS RCA 1997 and LAD DES 2000, HTN, HLD, chronic respiratory failure, recent complex GI procedure, has ostomy and feeding tube D/C from inpatient rehab 03/30/16. Admitted 03/02 w/ dyspnea x 4 days.  Assessment & Plan    1. Acute diastolic heart failure - BNP 1832 on admit. Echo this admission showed normal LV systolic function; grade 1 diastolic function; mild AI. His albumin level is 1.9.  - IV lasix 40mg  BID>>80 mg IV BID on 03/04.  - Continue supplemental potassium.  I & O net neg 6 L since admit - wt down ?18 lbs - BUN/Cr trending up slightly, would continue IV Lasix thru today, reassess in am  2. Elevated troponin - Flat trend. Demand ischemia in setting of acute illness and CHF.  - no further eval planned, EF nl on echo  3. CAD s/p BMS to RCA in 1997 and DES to LAD in 2000 - No angina.  Last Myoview 04/2013 normal.   4. HTN - Elevated BP had IV PRN hydralazine.    - improved w/ diuresis  5. Dysphagia -  On feeding tube - per IM  6.  Arrhythmia: NSVT (nonsustained ventricular tachycardia) (HCC), mainly w/ episodes narrow complex tachycardia - on Coreg 6.25 mg bid - no wheezing, no baseline bradycardia - increase Coreg to 12.5 mg bid  Otherwise, per IM Principal Problem:   Respiratory failure, acute (HCC) Active Problems:   Essential hypertension, benign   Hyperlipidemia   Anxiety state   Tobacco abuse   Hypernatremia   Hypokalemia   Elevated troponin   Acute on chronic diastolic heart failure (HCC)   SOB (shortness of breath)   Respiratory failure (HCC)    Signed, Barrett, Rhonda , PA-C 9:30 AM 04/18/2016 Pager: [phone]  Patient seen, examined. Available data reviewed. Agree with findings, assessment, and plan as outlined by Theodore Demark, PA-C. The patient is independently interviewed and examined. His wife is at the bedside. JVP is normal, lungs with diminished breath sounds in the bases but otherwise good air movement, heart is regular rate and rhythm without murmur, extremities with trace edema. Laboratory, echo, and radiographic data reviewed. Troponin is minimally elevated. Other labs are unremarkable with the exception of an albumin of 1.9. He also has chronic anemia but his hemoglobin is stable at 10.8. Reviewed notes of general surgery and internal medicine. The patient has had a complex course over the past year. Improvement in his nutritional status and rehabilitation with physical therapy or felt to be critical parts of his recovery and long-term prognosis. Will continue medical management for diastolic heart failure. Would not pursue any invasive cardiac evaluation at this point as I do not think this would be in his best interest considering all of the issues described above. Agree with medication changes as outlined by Theodore Demark, PA-C.   Tonny Bollman, M.D. 04/18/2016 2:09 PM

## 2016-04-18 NOTE — Progress Notes (Signed)
Nutrition Follow-up  INTERVENTION:  Recommend the following: Advance Jevity 1.5 by 5 ml/hr every 12 hrs to goal rate of 75 ml/hr (20 hrs daily) via feeding tube to provide 2250 kcal, 96 grams of protein, and 1140 ml of water.   Wean TPN when tolerating goal feeds  Family requesting SLP evaluation; monitor for results  NUTRITION DIAGNOSIS:   Inadequate oral intake related to altered GI function as evidenced by other (see comment) (multiple past abdominal surgeries requiring nutrition support to meet needs).  ongoing  GOAL:   Patient will meet greater than or equal to 90% of their needs  Being met  MONITOR:   TF tolerance, Weight trends, Labs, I & O's, Other (Comment) (TPN regimen)  REASON FOR ASSESSMENT:   Malnutrition Screening Tool, Consult New TPN/TNA  ASSESSMENT:   76 y.o. male  past mental history significant for chronic hypoxic respiratory failure, feeding tube, high blood pressure, low back pain, who presents emergency room with complaint shortness of breath. History mostly gathered from patient's wife as patient is demented at baseline. Per patient's wife patient has been in the hospital the last 2 months. Patient has been acutely short of breath for the last 2 days. Patient has been having a productive cough. Patient has had no fevers, chills, nausea, vomiting, diarrhea. No medications tried for patient's symptoms. Patient was brought to the emergency room for continuous complaints of shortness of breath.  Per MD note, TNA for severe malnutrition, give 1/2 volume now.  Stop once pt tolerating TFs at goal. Currently Jevity 1.5_0  ml/hr x 20 hrs is ordered, but pt is receiving Jevity 1.2 _1  ml/hr. Wife states that pt was on Jevity 1.5 PTA. Pt reports having pain with Jevity 1.5 PTA, but he is agreeable to trying Jevity 1.5 again to maximize nutrition intake. Per wife, J-tube is used for feeds.  Pt's wife states that pt has been able to swallow water and ice chips for the  past 2 weeks. Wife is requested a SLP evaluation.    Clinimix (682 kCal and 48gm protein) Jevity 1.2 at 40 ml/hr x 16 hrs (768 kCal and 36 g of protein)  Labs: elevated phosphorus, low calcium, low hemoglobin  Diet Order:  TPN (CLINIMIX) Adult without lytes  Skin:  Reviewed, no issues  Last BM:  3/3  Height:   Ht Readings from Last 1 Encounters:  04/15/16 6' (1.829 m)    Weight:   Wt Readings from Last 1 Encounters:  04/18/16 174 lb 6.4 oz (79.1 kg)    Ideal Body Weight:  80.91 kg  BMI:  Body mass index is 23.65 kg/m.  Estimated Nutritional Needs:   Kcal:  2020-2260 (25-28 kcal/kg)  Protein:  100-115 grams (~1.2-1.4 grams/kg)  Fluid:  >/= 2 L/day  EDUCATION NEEDS:   No education needs identified at this time  Scarlette Ar RD, LDN, CSP Inpatient Clinical Dietitian Pager: [phone] After Hours Pager: [phone]

## 2016-04-18 NOTE — Progress Notes (Signed)
Patient resting quietly. No complaints

## 2016-04-18 NOTE — Care Management Note (Addendum)
Case Management Note  Patient Details  Name: [name] MRN: [id number] Date of Birth: 05/10/1940  Subjective/Objective:    Admitted with Resp Failure                Action/Plan: Patient was recently discharged from [facility] home with [facility]; PCP is Dr Lillia MountainGRIFFIN,JOHN JOSEPH, MD; [facility] with [facility] drug coverage; CM talked to pt with spouse present about DCP; [facility]; he is refusing SNF at this time; pt also has private caregivers at home to assist in his care  12:15pm- spouse is requesting a bed extension for his hospital bed at home; order placed as requested.  Expected Discharge Date:     Possibly 04/23/2016             Expected Discharge Plan:  Home w Home Health Services  Discharge planning Services  CM Consult Choice offered to:  Patient, Spouse  HH Arranged:  RN, PT, Nurse's Aide HH Agency:  [facility]  Status of Service:  In process, will continue to follow  Reola MosherChandler, Nalu Troublefield L, RN,MHA,BSN [phone] 04/18/2016, 12:02 PM

## 2016-04-18 NOTE — Progress Notes (Addendum)
[facility]                                                                              Patient Demographics  [name], is a 76 y.o. male, DOB - [birth year], ZOX:[id number]  Admit date - 04/15/2016   Admitting Physician Haydee Salter, MD  Outpatient Primary MD for the patient is Lillia Mountain, MD  Outpatient specialists:   LOS - 2  days    Chief Complaint  Patient presents with  . Weakness  . Shortness of Breath       Brief summary    [name] is a 76 y.o. male  past mental history significant for chronic hypoxic respiratory failure, feeding tube, high blood pressure, low back pain, Who presented to ED with complaints of shortness of breath, productive cough for the last 2 days. Patient has dementia at the baseline and history was provided by his wife.    Assessment & Plan    Principal problem Acute hypoxic respiratory failure likely secondary to aspiration and acute diastolic CHF - Chest x-ray showed bilateral pleural effusions with dependent atelectasis/lower lung pneumonia possible fluid overload/CHF - CT chest showed small to moderate bilateral pleural effusions, mild bilateral peribronchial thickening suggesting inflammation/bronchitic changes - Continue duo nebs, placed on IV Zosyn  Active problems Acute diastolic CHF -2-D echo 3/2 showed EF of 50-55%, grade 1 diastolic dysfunction with diffuse hypokinesis  - Placed on strict I's and O's, Negative balance of 7 L  - Cardiology following, Lasix increased to 80mg  q12hrs IV   Elevated troponins - Possibly due to demand ischemia, cardiology following, 2-D echo with EF of 55% with grade 1 diastolic dysfunction, diffuse hypokinesis  NSVT Increased Coreg to 12.5 mg twice a day, no wheezing, cardiology following   Hypokalemia - Currently stable, follow closely  Dysphagia with G-tube feeding, severe malnutrition, hypoalbuminemia/failure to thrive. 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 surgery following, Dr Michaell Cowing updated me with patient's extensive surgical history and his slow and progressive recovery up until this point.  Hypernatremia - Improving, Increased free water flushes  Fungal infection  Cont Mycostatin 4 times a day  Mood disorder Prozac daily Xanax daily at bedtime  Back pain Continue Lidoderm patches  GERD Cont PPI  Code Status: full  DVT Prophylaxis:   SCD's Family Communication: Discussed in detail with the patient, all imaging results, lab results explained to the patient and discussed with wife in detail    Disposition Plan:   Time Spent in minutes 25 minutes  Procedures:   Consultants:   Cardiology Gen. surgery  Antimicrobials:   IV Zosyn 3/3   Medications  Scheduled Meds: . Alprazolam  0.5 mg Per Tube QHS,MR X 1  . aspirin  325 mg Oral Daily  . carvedilol  6.25 mg Oral BID WC  . chlorhexidine  15 mL Mouth Rinse BID  . cycloSPORINE  1 drop Both Eyes BID  . FLUoxetine  20 mg Per  Tube Daily  . furosemide  80 mg Intravenous BID AC  . guaiFENesin  10 mL Oral Q8H  . ipratropium-albuterol  3 mL Nebulization TID  . lidocaine  1 patch Transdermal Q24H  . lip balm  1 application Topical BID  . liver oil-zinc oxide   Topical BID  . mouth rinse  15 mL Mouth Rinse q12n4p  . multivitamin  15 mL Per Tube Daily  . nystatin  5 mL Per Tube QID  . omeprazole  40 mg Per Tube Daily  . piperacillin-tazobactam (ZOSYN)  IV  3.375 g Intravenous Q8H  . sodium chloride flush  3 mL Intravenous Q12H   Continuous Infusions: . sodium chloride    . TPN (CLINIMIX) Adult without lytes     And  . fat emulsion    . feeding supplement (JEVITY 1.5 CAL/FIBER) 1,000 mL (04/18/16 0845)   PRN Meds:.albuterol, HYDROcodone-acetaminophen,  menthol-cetylpyridinium, nitroGLYCERIN, polyvinyl alcohol, prochlorperazine, sodium chloride flush   Antibiotics   Anti-infectives    Start     Dose/Rate Route Frequency Ordered Stop   04/16/16 1000  piperacillin-tazobactam (ZOSYN) IVPB 3.375 g     3.375 g 12.5 mL/hr over 240 Minutes Intravenous Every 8 hours 04/16/16 0922     04/15/16 1645  azithromycin (ZITHROMAX) 500 mg in dextrose 5 % 250 mL IVPB  Status:  Discontinued     500 mg 250 mL/hr over 60 Minutes Intravenous Every 24 hours 04/15/16 1542 04/16/16 0922   04/15/16 1245  cefTRIAXone (ROCEPHIN) 1 g in dextrose 5 % 50 mL IVPB     1 g 100 mL/hr over 30 Minutes Intravenous  Once 04/15/16 1243 04/15/16 1812        Subjective:   [name] was seen and examined today.  Shortness of breath is improving, peripheral edema is improving. No fevers or chills, no chest pain.  Has dementia and difficult to obtain review of systems from the patient.    Objective:   Vitals:   04/17/16 1450 04/17/16 2135 04/18/16 0550 04/18/16 0804  BP:  138/72 128/65   Pulse:  82 91   Resp:  20 18   Temp:  97.7 F (36.5 C) 97.9 F (36.6 C)   TempSrc:  Oral Oral   SpO2: 93% 93% 95% 96%  Weight:   79.1 kg (174 lb 6.4 oz)   Height:        Intake/Output Summary (Last 24 hours) at 04/18/16 1117 Last data filed at 04/18/16 0955  Gross per 24 hour  Intake          1172.67 ml  Output             5500 ml  Net         -4327.33 ml     Wt Readings from Last 3 Encounters:  04/18/16 79.1 kg (174 lb 6.4 oz)  03/30/16 74 kg (163 lb 2.3 oz)  02/26/16 78.6 kg (173 lb 4.5 oz)     Exam  General: Alert and oriented to self, ill-appearing, frail  HEENT:    Neck: Supple, no JVD  Cardiovascular: S1 S2 clear,Irregularly irregular  Respiratory: Diminished breath sounds at the bases w  Gastrointestinal: Soft, nontender, nondistended, + bowel sounds, J tube  Ext: no cyanosis clubbing, 1+ edema, PICC in left arm  Neuro: no new  deficits  Skin: No rashes  Psych: Normal affect and demeanor, alert and oriented x self   Data Reviewed:  I have personally reviewed following labs and imaging studies  Micro  Results No results found for this or any previous visit (from the past 240 hour(s)).  Radiology Reports Dg Chest 2 View  Result Date: 04/15/2016 CLINICAL DATA:  Shortness of breath over the last week. EXAM: CHEST  2 VIEW COMPARISON:  02/28/2016 FINDINGS: Left arm PICC tip in the SVC 3 cm above the right atrium. Bilateral pleural effusions with dependent pulmonary atelectasis and/or pneumonia. Possible fluid overload/ interstitial edema. No acute bone finding. IMPRESSION: Bilateral effusions with dependent atelectasis/lower lung pneumonia. Possible fluid overload/ CHF as well. Electronically Signed   By: Paulina Fusi M.D.   On: 04/15/2016 11:24   Ct Chest Wo Contrast  Result Date: 04/15/2016 CLINICAL DATA:  Acute respiratory failure, dyspnea for a few months EXAM: CT CHEST WITHOUT CONTRAST TECHNIQUE: Multidetector CT imaging of the chest was performed following the standard protocol without IV contrast. COMPARISON:  CXR 04/15/2016 and 02/28/2016 FINDINGS: Cardiovascular: PICC line noted with tip in the distal SVC. Normal size cardiac chambers without pericardial effusion. Coronary arteriosclerosis along the LAD and circumflex. Aortic atherosclerosis. No aortic aneurysm. Mediastinum/Nodes: No supraclavicular, axillary nor mediastinal lymphadenopathy. Normal branch pattern of the great vessels with minimal atherosclerosis at the origins. No thyromegaly or nodules. The mainstem bronchi and trachea are unremarkable apart from some minimal mucous along the dependent aspect of the distal trachea and proximal left mainstem bronchus. Lungs/Pleura: Small to moderate bilateral pleural effusions left greater than right with compressive atelectasis. Trace fluid extends into both major fissures. Centrilobular emphysema. Mild peribronchial  thickening consistent with inflammation/ bronchitic change. Upper Abdomen: Partially visualized GJ tube. No acute abdominal abnormality to the extent visualized. Musculoskeletal: No chest wall mass or suspicious bone lesions identified. IMPRESSION: 1. Small to moderate bilateral pleural effusions with adjacent compressive atelectasis. 2. Mild bilateral peribronchial thickening suggesting inflammation/bronchitic change. 3. Coronary arteriosclerosis and aortic atherosclerosis. Electronically Signed   By: Tollie Eth M.D.   On: 04/15/2016 13:29   Ir Cm Inj Any Colonic Tube W/fluoro  Result Date: 04/13/2016 INDICATION: 76 year old with a GJ feeding tube. The jejunal lumen has been occluded for few days. Patient presents for tube evaluation and possible exchange. EXAM: GI TUBE INJECTION MEDICATIONS: None ANESTHESIA/SEDATION: None CONTRAST:  20 mL Omnipaque 300 - administered into the gastric lumen. FLUOROSCOPY TIME:  Fluoroscopy Time: 30 seconds, 1.4 mGy COMPLICATIONS: None immediate. PROCEDURE: Informed written consent was obtained from the patient's daughter after a thorough discussion of the procedural risks, benefits and alternatives. All questions were addressed. Maximal Sterile Barrier Technique was utilized including caps, mask, sterile gowns, sterile gloves, sterile drape, hand hygiene and skin antiseptic. A timeout was performed prior to the initiation of the procedure. Patient was placed supine on the interventional table. The jejunal lumen was clogged. The lumen was flushed with a 10 cc syringe and was successfully unclogged. Contrast injection confirmed tube placement in the small bowel. The gastric lumen was also injected and confirmed placement in the stomach. Retention balloon remains inflated within the stomach. Patient's heart rate was irregular when he presented. Patient was placed on a monitor and appeared to have frequent premature atrial contractions. This finding was discussed with the patient's  PCP, Dr. [name]. Patient was asymptomatic. IMPRESSION: The GJ tube was successfully unclogged. Jejunal tube is appropriately positioned in the small bowel and ready for use. Electronically Signed   By: Richarda Overlie M.D.   On: 04/13/2016 10:26   Ir Cm Inj Any Colonic Tube W/fluoro  Result Date: 03/22/2016 INDICATION: Inadvertent retraction of gastrojejunostomy catheter. EXAM: FLUOROSCOPIC GUIDED INJECTION  OF GASTROJEJUNOSTOMY TUBE COMPARISON:  Fluoroscopic guided gastrojejunostomy catheter exchange - 03/18/2016 MEDICATIONS: None. CONTRAST:  20mL ISOVUE-300 IOPAMIDOL (ISOVUE-300) INJECTION 61% - administered into the stomach and small bowel FLUOROSCOPY TIME:  36 seconds (2 mGy) COMPLICATIONS: None. PROCEDURE: The patient was positioned supine on the fluoroscopy table. Contrast was injected via both the gastrostomy and jejunostomy lumens demonstrating appropriate position functionality of both lumens. The gastrostomy balloon is normally positioned against the ventral wall of the gastric lumen. Both lumens were flushed with a small amount of saline and capped. A dressing was placed. The patient tolerated procedure well without immediate postprocedural complication. IMPRESSION: Appropriately positioned and functioning and gastrojejunostomy catheter. No exchange performed. Electronically Signed   By: Simonne Come M.D.   On: 03/22/2016 17:26   Dg Swallowing Func-speech Pathology  Result Date: 03/25/2016 Objective Swallowing Evaluation: Type of Study: MBS-Modified Barium Swallow Study Patient Details Name: LAJUANE LEATHAM MRN: [id number] Date of Birth: 06/16/1940 Today's Date: 03/25/2016 Time: SLP Start Time (ACUTE ONLY): 1100-SLP Stop Time (ACUTE ONLY): 1130 SLP Time Calculation (min) (ACUTE ONLY): 30 min Past Medical History: Past Medical History: Diagnosis Date . Aspiration pneumonia (HCC) 01/17/2016 . Bilateral dry eyes  . Bile peritonitis due to gastric tube dislodgement 12/03/2015 . BPH (benign prostatic  hyperplasia)  . Central pterygium of right eye  . Chronic respiratory failure with hypoxia (HCC)  . Coronary artery disease 1997, 2009  bare mental stent right coronart artery, occlusive followed by Dr. Garnette Scheuermann in [place] . Degenerative disc disease   LOWER BACK . Dysphonia  . ED (erectile dysfunction)  . Gastric outlet obstruction 11/24/2015 . Gastroesophageal reflux disease  . Hyperlipidemia  . Hyperopia of both eyes with astigmatism and presbyopia  . Hypertension  . Ischemic right index finger at tip 12/09/2015 . Nuclear senile cataract   Dr. [facility] in [place] . Peptic ulcer  . Pneumatosis of intestines 12/11/2015 . Pneumonia   HISTORY OF IN CHILDHOOD . Posterior vitreous detachment 10/30/2012 . Pyloric stricture 11/24/2015 . Salzmann's nodular dystrophy of left eye  . SBO (small bowel obstruction) 01/20/2016 . Urinary hesitancy  . Wears glasses  Past Surgical History: Past Surgical History: Procedure Laterality Date . CARDIAC CATHETERIZATION   . CHOLECYSTECTOMY   . ESOPHAGOGASTRODUODENOSCOPY N/A 12/01/2015  Procedure: ESOPHAGOGASTRODUODENOSCOPY (EGD) BALLOON DILATION OF DUODENAL STRICTURE;  Surgeon: Karie Soda, MD;  Location: [facility];  Service: General;  Laterality: N/A; . ESOPHAGOGASTRODUODENOSCOPY N/A 12/14/2015  Procedure: ESOPHAGOGASTRODUODENOSCOPY (EGD);  Surgeon: Vida Rigger, MD;  Location: [facility];  Service: Endoscopy;  Laterality: N/A;  Patient has tracheostomy . ESOPHAGOGASTRODUODENOSCOPY (EGD) WITH PROPOFOL N/A 07/01/2015  Procedure: ESOPHAGOGASTRODUODENOSCOPY (EGD) WITH PROPOFOL;  Surgeon: Willis Modena, MD;  Location: [facility];  Service: Endoscopy;  Laterality: N/A; . ESOPHAGOGASTRODUODENOSCOPY (EGD) WITH PROPOFOL N/A 11/23/2015  Procedure: ESOPHAGOGASTRODUODENOSCOPY (EGD) WITH PROPOFOL;  Surgeon: Willis Modena, MD;  Location: [facility];  Service: Endoscopy;  Laterality: N/A;  may need to intubate . EYE SURGERY    growth on right eye removed  . IR GENERIC  HISTORICAL  12/13/2015  IR REPLC DUODEN/JEJUNO TUBE PERCUT W/FLUORO 12/13/2015 [name], MD [facility] . IR GENERIC HISTORICAL  02/03/2016  IR REPLC GASTRO/COLONIC TUBE PERCUT W/FLUORO 02/03/2016 Berdine Dance, MD [facility] . IR GENERIC HISTORICAL  02/04/2016  IR GASTR TUBE CONVERT GASTR-JEJ PER W/FL MOD SED 02/04/2016 Malachy Moan, MD [facility] . IR GENERIC HISTORICAL  02/25/2016  IR SINUS/FIST TUBE CHK-NON GI 02/25/2016 Irish Lack, MD [facility] . IR GENERIC HISTORICAL  03/18/2016  IR CM INJ  ANY COLONIC TUBE W/FLUORO 03/18/2016 Simonne ComeJohn Watts, MD [facility] . IR GENERIC HISTORICAL  03/18/2016  IR GJ TUBE CHANGE 03/18/2016 Simonne ComeJohn Watts, MD [facility] . IR GENERIC HISTORICAL  03/22/2016  IR CM INJ ANY COLONIC TUBE W/FLUORO 03/22/2016 Simonne ComeJohn Watts, MD [facility] . LAPAROSCOPIC GASTROSTOMY N/A 12/01/2015  Procedure: LAPAROSCOPIC PLACEMENT OF FEEDING JEJUNOSTOMY AND GASTROSTOMY TUBE;  Surgeon: Karie SodaSteven Gross, MD;  Location: [facility];  Service: General;  Laterality: N/A; . LAPAROSCOPY N/A 12/03/2015  Procedure: LAPAROSCOPY DIAGNOSTIC, OMENTOPEXY, JEJUNOSTOMY, WASH OUT;  Surgeon: Karie SodaSteven Gross, MD;  Location: [facility];  Service: General;  Laterality: N/A; . LAPAROTOMY N/A 02/06/2016  Procedure: EXPLORATORY LAPAROTOMY, LYSIS OF ADHESIONS, SEGMENTAL SMALL BOWEL RESECTION, GASTROJEJUNOSTOMY;  Surgeon: Chevis PrettyPaul Toth III, MD;  Location: [facility];  Service: General;  Laterality: N/A; . STENT TO HEART    2 1997 AND 1 IN 1996 . TONSILLECTOMY   . XI ROBOTIC VAGOTOMY AND ANTRECTOMY N/A 09/25/2015  Procedure: XI ROBOTIC ANTERIOR AND POSTERIOR VAGOTOMY, BILROTH I  ANASTOMOSIS DOR FUNDIPLICATION, OMENTOPEXY  UPPER ENDOSCOPY;  Surgeon: Karie SodaSteven Gross, MD;  Location: [facility];  Service: General;  Laterality: N/A; HPI: [name] is a 76 y.o. male with h/o CAD and GERD who presents [facility] Emergency Department [facility] by EMS complaining of nausea, vomiting, and diarrhea onset onset two days ago, worsening  yesterday. Pt also has associated mild abdominal pain. Per daughter, pt has been admitted to the hospital recently for surgery in his abdomen; he has been staying at [facility] since then. Daughter reported to MD that pt has been fed through a G tube which gave him diarrhea that eventually resolved with a change in formula.   Daughter also informed MD that pt gradually started feeding by mouth again x2 days with return of diarrhea along with nausea and vomiting. Daughter notes that the facility staff have tried to change his formula but with no relief to his symptoms.  Pt has known h/o dysphagia dysphonia, pna, peptic ulcer, GERD.  Last MBS done 12/29/15 showed moderate oropharyngeal dysphagia that looked marginally worse than test ono 12/17/15.  Pt left on ice chips only after oral care.  CXR 12/6 showed LLL consolidation ? ATX or pna.   Subjective: pt awake in chair Assessment / Plan / Recommendation CHL IP CLINICAL IMPRESSIONS 03/25/2016 Therapy Diagnosis Moderate pharyngeal phase dysphagia Clinical Impression Pt presents with moderate sensorimotor pharyngeal dysphagia resulting in silent aspiration of nectar thick liquids. Swallow function continues to be delayed and weak complicated by very weak cough mechanism. This results in delayed swallow initiaiton to the valluclua, intermittent flash penetration, one occurance of shallow frank penetration after the swallow and significant residue at the pyriform sinuses that was silently aspirated after the swallow with nectar thick liquids. Pt unable to clear pharyngeal residue with cued cough or double swallow. After 4 nectar thick boluses, pt gagged  and hocked up pharyngeal residue. Two trials of thin liquids were presented via spoon with what appeared to be timely swallow initiation. Safety with thin liquids was difficult to assess as pt began to immediately excessively wretch. Trials were discontinued as excessive wretching continued. Pt didn't produce any  vomitous. These same wretching behaviors are present in ST session after consuming 2 to 3 tsp trials of thin liquids. If wretching had not taken place, swallow study could have continued with further trials of thin liquids. Pt continues to be at high risk for aspiration complicated by GI issues that are present  after ~ 6 tsp boluses of liquids. As a results, I recommend that pt remain NPO with small sips of thin H2O allowed under free water protocol as pt tolerates. Pt would also benefit from respiratory muscle training to strengthen cough and speech intensity. Education provided to PA. Of note, pt with wretching during ST session after only 3 to 4 tsp trials. Pt appears to have a gastrointestinal component that will affect his ability to return to PO intake for nutrition.  Impact on safety and function Risk for inadequate nutrition/hydration;Moderate aspiration risk   CHL IP TREATMENT RECOMMENDATION 03/25/2016 Treatment Recommendations Therapy as outlined in treatment plan below   Prognosis 03/25/2016 Prognosis for Safe Diet Advancement Guarded Barriers to Reach Goals Cognitive deficits;Time post onset;Severity of deficits;Other (Comment) Barriers/Prognosis Comment -- CHL IP DIET RECOMMENDATION 03/25/2016 SLP Diet Recommendations Free water protocol after oral care Liquid Administration via Spoon Medication Administration Via alternative means Compensations Small sips/bites Postural Changes Remain semi-upright after after feeds/meals (Comment);Seated upright at 90 degrees   CHL IP OTHER RECOMMENDATIONS 03/25/2016 Recommended Consults Consider GI evaluation Oral Care Recommendations Oral care QID Other Recommendations Have oral suction available   CHL IP FOLLOW UP RECOMMENDATIONS 03/25/2016 Follow up Recommendations Skilled Nursing facility   [facility] IP FREQUENCY AND DURATION 01/25/2016 Speech Therapy Frequency (ACUTE ONLY) min 2x/week Treatment Duration 1 week      CHL IP ORAL PHASE 03/25/2016 Oral Phase WFL Oral - Pudding  Teaspoon -- Oral - Pudding Cup -- Oral - Honey Teaspoon -- Oral - Honey Cup -- Oral - Nectar Teaspoon WFL Oral - Nectar Cup WFL Oral - Nectar Straw NT Oral - Thin Teaspoon WFL Oral - Thin Cup WFL Oral - Thin Straw NT Oral - Puree NT Oral - Mech Soft NT Oral - Regular -- Oral - Multi-Consistency -- Oral - Pill -- Oral Phase - Comment --  CHL IP PHARYNGEAL PHASE 03/25/2016 Pharyngeal Phase Impaired Pharyngeal- Pudding Teaspoon -- Pharyngeal -- Pharyngeal- Pudding Cup -- Pharyngeal -- Pharyngeal- Honey Teaspoon -- Pharyngeal -- Pharyngeal- Honey Cup -- Pharyngeal -- Pharyngeal- Nectar Teaspoon Delayed swallow initiation-vallecula;Penetration/Apiration after swallow;Significant aspiration (Amount);Pharyngeal residue - pyriform;Pharyngeal residue - valleculae;Pharyngeal residue - posterior pharnyx;Reduced pharyngeal peristalsis Pharyngeal Material enters airway, passes BELOW cords without attempt by patient to eject out (silent aspiration) Pharyngeal- Nectar Cup Delayed swallow initiation-vallecula;Reduced pharyngeal peristalsis;Penetration/Apiration after swallow;Significant aspiration (Amount);Pharyngeal residue - pyriform;Pharyngeal residue - valleculae;Pharyngeal residue - posterior pharnyx;Compensatory strategies attempted (with notebox) Pharyngeal Material enters airway, passes BELOW cords without attempt by patient to eject out (silent aspiration) Pharyngeal- Nectar Straw NT Pharyngeal -- Pharyngeal- Thin Teaspoon Delayed swallow initiation-vallecula;Pharyngeal residue - pyriform Pharyngeal Material enters airway, remains ABOVE vocal cords then ejected out Pharyngeal- Thin Cup NT Pharyngeal -- Pharyngeal- Thin Straw NT Pharyngeal -- Pharyngeal- Puree NT Pharyngeal -- Pharyngeal- Mechanical Soft -- Pharyngeal -- Pharyngeal- Regular NT Pharyngeal -- Pharyngeal- Multi-consistency -- Pharyngeal -- Pharyngeal- Pill -- Pharyngeal -- Pharyngeal Comment --  CHL IP CERVICAL ESOPHAGEAL PHASE 01/25/2016 Cervical Esophageal  Phase Impaired Pudding Teaspoon -- Pudding Cup -- Honey Teaspoon -- Honey Cup -- Nectar Teaspoon Reduced cricopharyngeal relaxation Nectar Cup Reduced cricopharyngeal relaxation Nectar Straw -- Thin Teaspoon Reduced cricopharyngeal relaxation Thin Cup Reduced cricopharyngeal relaxation Thin Straw Reduced cricopharyngeal relaxation Puree Reduced cricopharyngeal relaxation;Esophageal backflow into cervical esophagus Mechanical Soft -- Regular -- Multi-consistency -- Pill -- Cervical Esophageal Comment -- No flowsheet data found. Happi B. Dreama Saa, M.S., CCC-SLP Speech-Language Pathologist Happi Overton 03/25/2016, 12:53 PM  Lab Data:  CBC:  Recent Labs Lab 04/15/16 1031 04/16/16 0441 04/17/16 0444 04/18/16 0447  WBC 7.5 8.2 8.6 8.2  NEUTROABS 4.8  --  5.4 4.0  HGB 10.2* 10.2* 10.5* 10.8*  HCT 32.4* 33.0* 33.7* 33.9*  MCV 94.2 94.0 95.2 93.9  PLT 276 295 309 306   Basic Metabolic Panel:  Recent Labs Lab 04/15/16 1218  04/15/16 2206 04/16/16 0157 04/16/16 0441 04/17/16 0444 04/18/16 0447  NA  --   < > 148* 147* 147* 145 140  K  --   < > 3.0* 3.4* 3.2* 3.7 3.8  CL  --   < > 110 111 111 107 100*  CO2  --   < > 28 30 30  34* 35*  GLUCOSE  --   < > 108* 112* 117* 109* 125*  BUN  --   < > 22* 20 21* 21* 23*  CREATININE  --   < > 0.79 0.76 0.81 0.93 1.02  CALCIUM  --   < > 8.5* 8.5* 8.5* 8.5* 8.4*  MG 1.7  --   --   --   --  2.0 1.9  PHOS 3.4  --   --   --   --  4.4 5.0*  < > = values in this interval not displayed. GFR: Estimated Creatinine Clearance: 67.6 mL/min (by C-G formula based on SCr of 1.02 mg/dL). Liver Function Tests:  Recent Labs Lab 04/15/16 1031 04/17/16 0444 04/18/16 0447  AST 23 29 27   ALT 22 23 22   ALKPHOS 113 120 117  BILITOT 0.3 0.4 0.3  PROT 6.2* 6.5 6.8  ALBUMIN 2.0* 1.9* 1.9*   No results for input(s): LIPASE, AMYLASE in the last 168 hours. No results for input(s): AMMONIA in the last 168 hours. Coagulation Profile: No results for  input(s): INR, PROTIME in the last 168 hours. Cardiac Enzymes:  Recent Labs Lab 04/15/16 1031 04/15/16 1400 04/15/16 1950 04/16/16 0157  TROPONINI 0.07* 0.06* 0.06* 0.06*   BNP (last 3 results) No results for input(s): PROBNP in the last 8760 hours. HbA1C: No results for input(s): HGBA1C in the last 72 hours. CBG:  Recent Labs Lab 04/16/16 2110  GLUCAP 112*   Lipid Profile:  Recent Labs  04/17/16 0444 04/18/16 0447  TRIG 102 124   Thyroid Function Tests: No results for input(s): TSH, T4TOTAL, FREET4, T3FREE, THYROIDAB in the last 72 hours. Anemia Panel: No results for input(s): VITAMINB12, FOLATE, FERRITIN, TIBC, IRON, RETICCTPCT in the last 72 hours. Urine analysis:    Component Value Date/Time   COLORURINE YELLOW 02/28/2016 1036   APPEARANCEUR CLEAR 02/28/2016 1036   LABSPEC 1.018 02/28/2016 1036   PHURINE 5.0 02/28/2016 1036   GLUCOSEU NEGATIVE 02/28/2016 1036   HGBUR NEGATIVE 02/28/2016 1036   BILIRUBINUR NEGATIVE 02/28/2016 1036   KETONESUR NEGATIVE 02/28/2016 1036   PROTEINUR NEGATIVE 02/28/2016 1036   NITRITE NEGATIVE 02/28/2016 1036   LEUKOCYTESUR NEGATIVE 02/28/2016 1036     Ocia Simek M.D. [facility] 04/18/2016, 11:17 AM  Pager: [phone] Between 7am to 7pm - call Pager - [phone]  After 7pm go to [email or web address] - password [email or web address]  Call night coverage person covering after 7pm

## 2016-04-18 NOTE — Evaluation (Signed)
Physical Therapy Evaluation Patient Details Name: [name] MRN: [id number] DOB: 1940-05-26 Today's Date: 04/18/2016   History of Present Illness  Pt is a 76 yo male with PMH significant for chronic hypoxic respiratory failure, NSVT, dysphagia, feeding tube, HTN, LBP, dementia, HLD, gastric outlet obstruction, aspiration pneumonia, small bowel obstruction who presented to ED on 04/15/16 for SOB. Pt recently d/c from CIR on 2/14. CXR shows bilat pleural effusions. Echo shows EF 50-55%.   Clinical Impression  Pt presents to PT with generalized weakness, dyspnea on exertion, decreased balance, and an overall significant decrease in functional mobility. PTA, pt amb limited household distances with RW and assist from wife. Per CIR PT note, pt amb 225' with minA before d/c from CIR on 2/14. Today, pt able to stand 2x from chair with modA +2; pt significantly limited by dyspnea 4/4 with mobility. Educ on importance of mobility and therex. Pt's wife adamantly refusing SNF placement, stating she will get the necessary support he needs at home from family, friends, and [facility] services. Recommend continued acute PT services to maximize functional mobility and independence. If pt does go home, recommend continuing with HHPT services.    Follow Up Recommendations CIR    Equipment Recommendations  None recommended by PT    Recommendations for Other Services Rehab consult     Precautions / Restrictions Precautions Precautions: Fall Precaution Comments: NPO, gastrostomy tube; watch O2 and HR Restrictions Weight Bearing Restrictions: No      Mobility  Bed Mobility               General bed mobility comments: Pt received sitting in chair  Transfers Overall transfer level: Needs assistance Equipment used: Rolling walker (2 wheeled) Transfers: Sit to/from Stand Sit to Stand: +2 physical assistance;Mod assist         General transfer comment: ModA +2 to scoot to edge of chair and stand x2;  pt relied on resting legs on back of chair. Dyspnea 3-4/4 with standing. Pt only able to stand >30 sec before needing to sit secondary to "feeling weak" and SOB.   Ambulation/Gait                Stairs            Wheelchair Mobility    Modified Rankin (Stroke Patients Only)       Balance Overall balance assessment: Needs assistance Sitting-balance support: Bilateral upper extremity supported;Feet supported Sitting balance-Leahy Scale: Poor     Standing balance support: Bilateral upper extremity supported;During functional activity Standing balance-Leahy Scale: Poor                               Pertinent Vitals/Pain Pain Assessment: No/denies pain    Home Living Family/patient expects to be discharged to:: Private residence Living Arrangements: Spouse/significant other Available Help at Discharge: Family;Available 24 hours/day;Home health Type of Home: House Home Access: Stairs to enter Entrance Stairs-Rails: Lawyer of Steps: 3 Home Layout: One level Home Equipment: Walker - 2 wheels Additional Comments: Wife reports she will have as much help as needed upon d/c between family, friends, and HH services.     Prior Function Level of Independence: Needs assistance   Gait / Transfers Assistance Needed: Pt reports limited household amb with RW and assist from wife.   ADL's / Homemaking Assistance Needed: Requires assist for bathing        Hand Dominance  Extremity/Trunk Assessment   Upper Extremity Assessment Upper Extremity Assessment: Generalized weakness    Lower Extremity Assessment Lower Extremity Assessment: Generalized weakness       Communication   Communication: Expressive difficulties (Dysphagia)  Cognition Arousal/Alertness: Awake/alert Behavior During Therapy: WFL for tasks assessed/performed Overall Cognitive Status: Impaired/Different from baseline Area of Impairment: Problem  solving;Following commands       Following Commands: Follows multi-step commands with increased time     Problem Solving: Decreased initiation      General Comments General comments (skin integrity, edema, etc.): HR 90s-130s with activity. SpO2 remained >88% on 2L O2 [name].     Exercises Total Joint Exercises Ankle Circles/Pumps: AROM;Both;10 reps Long Arc Quad: Strengthening;10 reps;Both General Exercises - Lower Extremity Hip Flexion/Marching: Strengthening;Both;10 reps   Assessment/Plan    PT Assessment Patient needs continued PT services  PT Problem List Decreased strength;Decreased balance;Decreased mobility;Decreased activity tolerance;Decreased safety awareness;Cardiopulmonary status limiting activity       PT Treatment Interventions Gait training;Stair training;Therapeutic exercise;Therapeutic activities;Functional mobility training;Balance training;Patient/family education    PT Goals (Current goals can be found in the Care Plan section)  Acute Rehab PT Goals Patient Stated Goal: Return home PT Goal Formulation: With patient Time For Goal Achievement: 05/02/16 Potential to Achieve Goals: Fair    Frequency Min 3X/week   Barriers to discharge        Co-evaluation               End of Session Equipment Utilized During Treatment: Gait belt;Oxygen Activity Tolerance: Patient limited by fatigue;Treatment limited secondary to medical complications (Comment) (Dyspnea) Patient left: in chair;with chair alarm set;with family/visitor present;with call bell/phone within reach Nurse Communication: Mobility status PT Visit Diagnosis: Muscle weakness (generalized) (M62.81);Other abnormalities of gait and mobility (R26.89)         Time: [id number] PT Time Calculation (min) (ACUTE ONLY): 24 min   Charges:   PT Evaluation $PT Eval Moderate Complexity: 1 Procedure PT Treatments $Therapeutic Activity: 8-22 mins   PT G Codes:       [name],  SPT Office-[phone]  [name] 04/18/2016, 4:32 PM

## 2016-04-18 NOTE — Progress Notes (Addendum)
[address] [place], [place] [address] Phone: [phone] FAX: [phone]   [name] [id number] 1940-02-27    Problem List:   Principal Problem:   Respiratory failure, acute [facility]) Active Problems:   Essential hypertension, benign   Hyperlipidemia   Anxiety state   Tobacco abuse   Hypernatremia   Hypokalemia   Elevated troponin   Acute on chronic diastolic heart failure (HCC)   SOB (shortness of breath)   NSVT (nonsustained ventricular tachycardia) (HCC)   Respiratory failure ([place])      SURGERY   02/06/2016  POST-OPERATIVE DIAGNOSIS:  SMALL BOWEL OBSTRUCTION  PROCEDURE:   EXPLORATORY LAPAROTOMY & LYSIS OF ADHESIONS SMALL BOWEL RESECTION GASTROJEJUNOSTOMY [place] tube placement  SURGEON:   Jovita Kussmaul, MD - Primary.  Alphonsa Overall, MD - Assisting  SURGERY 12/03/2015  POST-OPERATIVE DIAGNOSIS:   Peritonitis Jejunal disruption Gastric leak  PROCEDURE:   LAPAROSCOPY DIAGNOSTIC OMENTOPEXY of jejunal disruption [place] OUT x 21L with drains placement  SURGEON: Michael Boston, MD   Post-Op 12/01/2015  POST-OPERATIVE DIAGNOSIS:   RECURRENT GASTRIC OUTLET OBSTRUCTION DUE to significant edema at gastroduodenal anastomosis. GASTRITIS Failure to thrive with malnutrition  PROCEDURE:   LAPAROSCOPIC PLACEMENT OF FEEDING JEJUNOSTOMY AND GASTROSTOMY TUBEs ESOPHAGOGASTRODUODENOSCOPY (EGD) BALLOON DILATION OF DUODENAL STRICTURE Gastric biopsies  Closure of gastrotomy  SURGEON: Michael Boston, MD  Prior surgery 09/25/2015  POST-OPERATIVE DIAGNOSIS: Partial gastric outlet obstruction due to chronic pyloric stricture  PROCEDURE:  XI ROBOTIC distal gastrectomy BILROTH I ANASTOMOSIS  ANTERIOR AND POSTERIOR VAGOTOMY DOR (Anterior 226 degree) FUNDIPLICATION OMENTOPEXY UPPER ENDOSCOPY  SURGEON: Michael Boston, MD    Assessment  Recovering  Plan:  -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  Emycin BID & PRN.  Reglan or venting PRN bloating  -TFs in jejunal end of GJ tube.  Adv to goal.    May use Jevity 1.5 or Nepro 2 instead.  Nutrition may adjust but recommend fluid restriction given h/o chronic CHF.   Once tolerating TF's at goal: -wean off TNA -Switch to bolus feeds ~2 cans TID via the G port of the GJ tube.  Nutrition may adjust   -TNA for severe malnutrition.  Give 1/2 volume now since at 1/2 goal of TFs now & problem w volume overload/CHF.  Stop once pt tolerating TFs at goal  Albumin finally = 2 & prealbumin in the 30s now which is a hopeful sign.  I suspect his recovery will be slow until Alb>3.  -Retry oral intake.  Ultimately up to [place], but allow him to have ice chips and sips of clears, 62m q1H volume restricted.  -consider Neuro eval for slurred speech  -VTE prophylaxis- SCDs, etc  -PPI - restarted omeprazole  -mobilize as tolerated to help recovery.  Making progress w PT/OT/SLP. I strongly believe rehab CRITICAL for recovery and would maximize inpatient LOS as much as possible  My overall plan would be:  Advance tube feeds to goal.  Then wean TNA.  Switch to 2cans TID Jevity 1.5  Advance oral diet as tolerated  Pull out his gastrostomy tube when he is tolerating everything by mouth more than three weeks.  I suspect this will take several months.  His severe malnutrition will need to be corrected.  That is why nutrition and aggressive physical therapy will be absolutely essential.  I updated the  patient's status to the patient.  Dr Tana Coast with [facility].  Long discussion over an hour.  Also the rehabilitation nursing team.  Recommendations were made.  Questions were answered.  He expressed understanding & appreciation.   [name], M.D., F.A.C.S. Gastrointestinal and Minimally Invasive Surgery [facility] [address], [place], [place] [address] [phone] Main / Paging   04/18/2016  CARE TEAM:  PCP: Irven Shelling, MD  Outpatient Care Team: Patient Care Team: Lavone Orn, MD as PCP - General (Internal Medicine) Michael Boston, MD as Consulting Physician (General Surgery) Arta Silence, MD as Consulting Physician (Gastroenterology) Chesley Mires, MD as Consulting Physician (Pulmonary Disease)  Inpatient Treatment Team: Treatment Team: Attending Provider: Mendel Corning, MD; Consulting Physician: Michae Kava Lbcardiology, MD; Consulting Physician: Michael Boston, MD; Rounding Team: Kelvin Cellar, MD; Technician: Ignacia Bayley  Subjective:  Patient awake and more alert.   Staying in bed Dr Tana Coast [facility] at bedside  Objective:  Vital signs:  Vitals:   04/17/16 1230 04/17/16 1450 04/17/16 2135 04/18/16 0550  BP: (!) 141/94  138/72 128/65  Pulse: 86  82 91  Resp: _0 Temp: 98.4 F (36.9 C)  97.7 F (36.5 C) 97.9 F (36.6 C)  TempSrc: Oral  Oral Oral  SpO2: 94% 93% 93% 95%  Weight:    79.1 kg (174 lb 6.4 oz)  Height:        Last BM Date: 04/16/16  Intake/Output   Yesterday:  03/04 0701 - 03/05 0700 In: 2347.7 [I.V.:912.7; NG/GT:920; IV Piggyback:515] Out: 2458 [Urine:5400] This shift:  Total I/O In: 1005.2 [I.V.:905.2; IV Piggyback:100] Out: 2300 [Urine:2300]  Bowel function:  Flatus: YES  BM:  YES  Drain: (No drain)   Physical Exam:  General: Pt awake/alert/oriented x4 in No acute distress.  Tired but not toxic.  Mostly withdrawn. Eyes: PERRL, normal EOM.  Sclera clear.  No icterus Neuro: CN II-XII intact w/o focal sensory/motor deficits.  Still with slurred speech Lymph: No head/neck/groin lymphadenopathy Psych:  No delerium/psychosis/paranoia.  Not agitated.  No evidence of dementia. HENT: Normocephalic, Mucus membranes moist.  No thrush.  Tracheostomy site closed Neck: Supple, No tracheal deviation Chest: No chest  wall pain w good excursion CV:  Pulses intact.  Regular rhythm MS: Normal AROM mjr joints.  No obvious deformity  Abdomen: Soft.  Nondistended.  Nontender.  No evidence of peritonitis.  No incarcerated hernias.  Gastrostomy tube site heal down and closed.  No necrotic tissue.    Midline wound with minimal scab.  No drainage  Much improved  Ext:  SCDs BLE.  No mjr edema.  No cyanosis Skin: No petechiae / purpura  Results:   Labs: Results for orders placed or performed during the hospital encounter of 04/15/16 (from the past 48 hour(s))  Glucose, capillary     Status: Abnormal   Collection Time: 04/16/16  9:10 PM  Result Value Ref Range   Glucose-Capillary 112 (H) 65 - 99 mg/dL   Comment 1 Notify RN    Comment 2 Document in Chart   CBC     Status: Abnormal   Collection Time: 04/17/16  4:44 AM  Result Value Ref Range   WBC 8.6 4.0 - 10.5 K/uL   RBC 3.54 (L) 4.22 - 5.81 MIL/uL   Hemoglobin 10.5 (L) 13.0 - 17.0 g/dL   HCT 33.7 (L) 39.0 - 52.0 %   MCV 95.2 78.0 - 100.0 fL   MCH 29.7 26.0 - 34.0  pg   MCHC 31.2 30.0 - 36.0 g/dL   RDW 00.7 (H) 13.0 - 14.7 %   Platelets 309 150 - 400 K/uL  Comprehensive metabolic panel     Status: Abnormal   Collection Time: 04/17/16  4:44 AM  Result Value Ref Range   Sodium 145 135 - 145 mmol/L   Potassium 3.7 3.5 - 5.1 mmol/L   Chloride 107 101 - 111 mmol/L   CO2 34 (H) 22 - 32 mmol/L   Glucose, Bld 109 (H) 65 - 99 mg/dL   BUN 21 (H) 6 - 20 mg/dL   Creatinine, Ser 5.45 0.61 - 1.24 mg/dL   Calcium 8.5 (L) 8.9 - 10.3 mg/dL   Total Protein 6.5 6.5 - 8.1 g/dL   Albumin 1.9 (L) 3.5 - 5.0 g/dL   AST 29 15 - 41 U/L   ALT 23 17 - 63 U/L   Alkaline Phosphatase 120 38 - 126 U/L   Total Bilirubin 0.4 0.3 - 1.2 mg/dL   GFR calc non Af Amer >60 >60 mL/min   GFR calc Af Amer >60 >60 mL/min    Comment: (NOTE) The eGFR has been calculated using the CKD EPI equation. This calculation has not been validated in all clinical situations. eGFR's persistently  <60 mL/min signify possible Chronic Kidney Disease.    Anion gap 4 (L) 5 - 15  Prealbumin     Status: Abnormal   Collection Time: 04/17/16  4:44 AM  Result Value Ref Range   Prealbumin 17.9 (L) 18 - 38 mg/dL  Magnesium     Status: None   Collection Time: 04/17/16  4:44 AM  Result Value Ref Range   Magnesium 2.0 1.7 - 2.4 mg/dL  Phosphorus     Status: None   Collection Time: 04/17/16  4:44 AM  Result Value Ref Range   Phosphorus 4.4 2.5 - 4.6 mg/dL  Differential     Status: None   Collection Time: 04/17/16  4:44 AM  Result Value Ref Range   Neutrophils Relative % 62 %   Neutro Abs 5.4 1.7 - 7.7 K/uL   Lymphocytes Relative 23 %   Lymphs Abs 2.0 0.7 - 4.0 K/uL   Monocytes Relative 11 %   Monocytes Absolute 0.9 0.1 - 1.0 K/uL   Eosinophils Relative 4 %   Eosinophils Absolute 0.3 0.0 - 0.7 K/uL   Basophils Relative 0 %   Basophils Absolute 0.0 0.0 - 0.1 K/uL  Triglycerides     Status: None   Collection Time: 04/17/16  4:44 AM  Result Value Ref Range   Triglycerides 102 <150 mg/dL  CBC     Status: Abnormal   Collection Time: 04/18/16  4:47 AM  Result Value Ref Range   WBC 8.2 4.0 - 10.5 K/uL   RBC 3.61 (L) 4.22 - 5.81 MIL/uL   Hemoglobin 10.8 (L) 13.0 - 17.0 g/dL   HCT 73.4 (L) 57.8 - 99.0 %   MCV 93.9 78.0 - 100.0 fL   MCH 29.9 26.0 - 34.0 pg   MCHC 31.9 30.0 - 36.0 g/dL   RDW 41.7 92.0 - 45.4 %   Platelets 306 150 - 400 K/uL  Comprehensive metabolic panel     Status: Abnormal   Collection Time: 04/18/16  4:47 AM  Result Value Ref Range   Sodium 140 135 - 145 mmol/L   Potassium 3.8 3.5 - 5.1 mmol/L   Chloride 100 (L) 101 - 111 mmol/L   CO2 35 (H) 22 - 32 mmol/L  Glucose, Bld 125 (H) 65 - 99 mg/dL   BUN 23 (H) 6 - 20 mg/dL   Creatinine, Ser 1.02 0.61 - 1.24 mg/dL   Calcium 8.4 (L) 8.9 - 10.3 mg/dL   Total Protein 6.8 6.5 - 8.1 g/dL   Albumin 1.9 (L) 3.5 - 5.0 g/dL   AST 27 15 - 41 U/L   ALT 22 17 - 63 U/L   Alkaline Phosphatase 117 38 - 126 U/L   Total  Bilirubin 0.3 0.3 - 1.2 mg/dL   GFR calc non Af Amer >60 >60 mL/min   GFR calc Af Amer >60 >60 mL/min    Comment: (NOTE) The eGFR has been calculated using the CKD EPI equation. This calculation has not been validated in all clinical situations. eGFR's persistently <60 mL/min signify possible Chronic Kidney Disease.    Anion gap 5 5 - 15  Magnesium     Status: None   Collection Time: 04/18/16  4:47 AM  Result Value Ref Range   Magnesium 1.9 1.7 - 2.4 mg/dL  Phosphorus     Status: Abnormal   Collection Time: 04/18/16  4:47 AM  Result Value Ref Range   Phosphorus 5.0 (H) 2.5 - 4.6 mg/dL  Differential     Status: None   Collection Time: 04/18/16  4:47 AM  Result Value Ref Range   Neutrophils Relative % 49 %   Neutro Abs 4.0 1.7 - 7.7 K/uL   Lymphocytes Relative 39 %   Lymphs Abs 3.2 0.7 - 4.0 K/uL   Monocytes Relative 9 %   Monocytes Absolute 0.7 0.1 - 1.0 K/uL   Eosinophils Relative 4 %   Eosinophils Absolute 0.3 0.0 - 0.7 K/uL   Basophils Relative 1 %   Basophils Absolute 0.0 0.0 - 0.1 K/uL  Prealbumin     Status: None   Collection Time: 04/18/16  4:47 AM  Result Value Ref Range   Prealbumin 19.2 18 - 38 mg/dL  Triglycerides     Status: None   Collection Time: 04/18/16  4:47 AM  Result Value Ref Range   Triglycerides 124 <150 mg/dL    Imaging / Studies: No results found.  Medications / Allergies: per chart  Antibiotics: Anti-infectives    Start     Dose/Rate Route Frequency Ordered Stop   04/16/16 1000  piperacillin-tazobactam (ZOSYN) IVPB 3.375 g     3.375 g 12.5 mL/hr over 240 Minutes Intravenous Every 8 hours 04/16/16 0922     04/15/16 1645  azithromycin (ZITHROMAX) 500 mg in dextrose 5 % 250 mL IVPB  Status:  Discontinued     500 mg 250 mL/hr over 60 Minutes Intravenous Every 24 hours 04/15/16 1542 04/16/16 0922   04/15/16 1245  cefTRIAXone (ROCEPHIN) 1 g in dextrose 5 % 50 mL IVPB     1 g 100 mL/hr over 30 Minutes Intravenous  Once 04/15/16 1243 04/15/16  1812        Note: Portions of this report may have been transcribed using voice recognition software. Every effort was made to ensure accuracy; however, inadvertent computerized transcription errors may be present.   Any transcriptional errors that result from this process are unintentional.     [name], M.D., F.A.C.S. Gastrointestinal and Minimally Invasive Surgery [facility] [address], [place],  [address] [phone] Main / Paging   04/18/2016

## 2016-04-18 NOTE — Progress Notes (Signed)
PHARMACY - ADULT TOTAL PARENTERAL NUTRITION CONSULT NOTE   Pharmacy Consult:  TPN Indication:  EC fistula  Patient Measurements: Height: 6' (182.9 cm) Weight: 174 lb 6.4 oz (79.1 kg) (c scale) IBW/kg (Calculated) : 77.6 TPN AdjBW (KG): 87.3 Body mass index is 23.65 kg/m.  Assessment:  [age] YOM presented on 04/15/16 with trouble breathing.  He has a recent history of multiple procedures and extended hospitalization for recurrent partial GOO 2/2 pyloric stricture.  Patient was on TF and TPN PTA for nutrition.  Pharmacy consulted to continue chronic TPN while inpatient.  GI: has closed drain for abdomen and PEG tube.  Prealbumin WNL at 19.2.  Endo: no hx DM - did not need SSI in the past.  AM glucose controlled. Lytes: Phos 5, CO2 elevated, low CL (Ca x Phos = 50, goal < 55) Renal: BPH - SCr up 1.02, CrCL 68 ml/min - good UOP 2.8 ml/kg/hr, net -6L since admit Pulm: chronic resp failure on 2L [place] - guaifenesin, Duonebs Cards: CAD / HLD / HTN (EF 50-55%) - VSS - ASA, Coreg, Lasix IV Hepatobil: LFTs / tbili / TG WNL Neuro: DJD - fluoxetine, alprazolam, lidocaine patch ID: Zosyn / Nystatin powder - afebrile, WBC WNL. Best Practices: SCDs, MC TPN Access: PICC single lumen 04/15/16 TPN start date: chronic TPN  Nutritional Goals (per RD recommendation on 3/3): 2020-2260 kCal and 100-115 gm protein per day  Current Nutrition:  Clinimix (682 kCal and 48gm protein) Jevity 1.2 at 40 ml/hr x 16 hrs (768 kCal and 36 g of protein)  [facility] TPN Formula: 1661 kCal and 78 g of protein (250 g of dextrose, 50 g of lipids) TE in  TPN   Plan:  - Continue Clinimix 5/15, remove electrolytes today to minimize the risk of Ca-Phos precipitation.  Infuse 1000 mL over 12 hrs: 50 mL/hr x 1 hr, then 90 mL/hr x 10 hrs, then 50 mL/hr x 1 hr.  Lipids at 8 ml/hr x 12 hrs. - Continue Jevity 1.2 at 40 ml/hr - TPN + TF will provide 1670 kCal and 86gm protein per day, meeting 83% of minimal kCal and 86% of  minimal protein needs. - Daily multivitamin PT - Trace elements in TPN every other day d/t shortage, next dose 3/6 - KCL 30mEq PT given TPN without electrolytes and on scheduled IV Lasix - F/U AM labs   Felicitas Sine D. Laney Potashang, PharmD, BCPS Pager:  [phone] 04/18/2016, 8:22 AM

## 2016-04-18 NOTE — Consult Note (Signed)
[facility] Nurse wound consult note Reason for Consult: history of EC fistula, assess to PEG tube site Met with patient but he was unable to really give me history on the fistula site.  This [place] nurse followed him during his last admission and when I last saw him during his inpatient rehab stay he was using an [place] for management of this.  I contact his daughter and she reports the drainage has dropped off enough that they are able to manage the site with dry dressings now.  She also reports that the PEG tube has been sewn to the skin and that the sutures have come out, she is requesting the PEG tube be sewn down again. That will be surgical team decision and I will offer  Wound type: abdominal wound, two small areas that have a small bit of hypergranulation tissue present, no EC drainage noted at the time of my assessment  Attempted to use tube stabilizer device to secure the PEG tube, however due to the bumper on the device it was very difficult and the tube stabilizer device was not able to be placed.  I did not feel the device would improve the concern for the family.  CCS will need to address suturing bumper if the family still desires this.  Pressure Injury POA: No Dressing procedure/placement/frequency: Clean abdominal wound with normal saline, cover with dry dressing. Change daily.  Discussed POC with bedside nurse.  Re consult if needed, will not follow at this time. Thanks  Maygan Koeller [employer], RN,CWOCN, CNS [phone])

## 2016-04-18 NOTE — Progress Notes (Signed)
Rehab Admissions Coordinator Note:  Patient was screened by Clois DupesBoyette, [place] Nylund [place] for appropriateness for an Inpatient Acute Rehab Consult per PT recommendation.  At this time, we are recommending an inpt rehab consult if pt and wife would like to be considered for readmission to CIR.[name]. Recent d/c from CIR 03/30/16.  Clois DupesBoyette, Kaho Selle [place] 04/18/2016, 5:43 PM  I can be reached at [phone].

## 2016-04-19 DIAGNOSIS — R5381 Other malaise: Secondary | ICD-10-CM

## 2016-04-19 LAB — GLUCOSE, CAPILLARY
GLUCOSE-CAPILLARY: 106 mg/dL — AB (ref 65–99)
Glucose-Capillary: 119 mg/dL — ABNORMAL HIGH (ref 65–99)
Glucose-Capillary: 108 mg/dL — ABNORMAL HIGH (ref 65–99)

## 2016-04-19 LAB — BASIC METABOLIC PANEL
Anion gap: 8 (ref 5–15)
BUN: 29 mg/dL — AB (ref 6–20)
CO2: 32 mmol/L (ref 22–32)
Calcium: 8.4 mg/dL — ABNORMAL LOW (ref 8.9–10.3)
Chloride: 97 mmol/L — ABNORMAL LOW (ref 101–111)
Creatinine, Ser: 1.07 mg/dL (ref 0.61–1.24)
GFR calc Af Amer: >60 mL/min (ref >60)
Glucose, Bld: 111 mg/dL — ABNORMAL HIGH (ref 65–99)
POTASSIUM: 3.5 mmol/L (ref 3.5–5.1)
SODIUM: 137 mmol/L (ref 135–145)

## 2016-04-19 LAB — CBC
HCT: 34.6 % — ABNORMAL LOW (ref 39.0–52.0)
Hemoglobin: 11.3 g/dL — ABNORMAL LOW (ref 13.0–17.0)
MCH: 30.3 pg (ref 26.0–34.0)
MCHC: 32.7 g/dL (ref 30.0–36.0)
MCV: 92.8 fL (ref 78.0–100.0)
PLATELETS: 318 10*3/uL (ref 150–400)
RBC: 3.73 MIL/uL — AB (ref 4.22–5.81)
RDW: 15.3 % (ref 11.5–15.5)
WBC: 9.9 10*3/uL (ref 4.0–10.5)

## 2016-04-19 LAB — PHOSPHORUS: Phosphorus: 3.8 mg/dL (ref 2.5–4.6)

## 2016-04-19 MED ORDER — SODIUM PHOSPHATES 15 MMOLE/5ML IV SOLN
INTRAVENOUS | Status: DC
  Administered 2016-04-19: 18:00:00 via INTRAVENOUS
  Filled 2016-04-19: qty 600

## 2016-04-19 MED ORDER — JEVITY 1.5 CAL/FIBER PO LIQD
1000.0000 mL | ORAL | Status: DC
  Administered 2016-04-19: 1000 mL
  Filled 2016-04-19 (×2): qty 1000

## 2016-04-19 MED ORDER — SODIUM PHOSPHATES 15 MMOLE/5ML IV SOLN
INTRAVENOUS | Status: DC
  Filled 2016-04-19: qty 600

## 2016-04-19 MED ORDER — CARVEDILOL 12.5 MG PO TABS
12.5000 mg | ORAL_TABLET | Freq: Two times a day (BID) | ORAL | Status: DC
  Administered 2016-04-19 – 2016-04-22 (×6): 12.5 mg via ORAL
  Filled 2016-04-19 (×7): qty 1

## 2016-04-19 MED ORDER — FUROSEMIDE 80 MG PO TABS: 80.0000 mg | ORAL_TABLET | ORAL | Status: DC

## 2016-04-19 MED ORDER — CLINIMIX E/DEXTROSE (5/15) 5 % IV SOLN
INTRAVENOUS | Status: DC
  Filled 2016-04-19: qty 600

## 2016-04-19 MED ORDER — JEVITY 1.5 CAL/FIBER PO LIQD
1000.0000 mL | ORAL | Status: DC
Start: 2016-04-20 — End: 2016-04-20
  Filled 2016-04-19 (×3): qty 1000

## 2016-04-19 MED ORDER — CARVEDILOL 6.25 MG PO TABS
6.2500 mg | ORAL_TABLET | Freq: Once | ORAL | Status: AC
  Administered 2016-04-19: 6.25 mg via ORAL
  Filled 2016-04-19: qty 1

## 2016-04-19 MED ORDER — POTASSIUM CHLORIDE 20 MEQ PO PACK
40.0000 meq | PACK | Freq: Once | ORAL | Status: AC
  Administered 2016-04-19: 40 meq via ORAL
  Filled 2016-04-19: qty 2

## 2016-04-19 MED ORDER — FUROSEMIDE 40 MG PO TABS: 40.0000 mg | ORAL_TABLET | ORAL | Status: DC

## 2016-04-19 MED ORDER — JEVITY 1.5 CAL/FIBER PO LIQD
1000.0000 mL | ORAL | Status: AC
  Administered 2016-04-19: 1000 mL
  Filled 2016-04-19 (×2): qty 1000

## 2016-04-19 MED ORDER — PRO-STAT SUGAR FREE PO LIQD
30.0000 mL | Freq: Every day | ORAL | Status: DC
  Administered 2016-04-19 – 2016-04-22 (×4): 30 mL
  Filled 2016-04-19 (×4): qty 30

## 2016-04-19 MED ORDER — VITAL HIGH PROTEIN PO LIQD
1000.0000 mL | ORAL | Status: DC
  Filled 2016-04-19 (×2): qty 1000

## 2016-04-19 MED ORDER — FUROSEMIDE 40 MG PO TABS
40.0000 mg | ORAL_TABLET | ORAL | Status: DC
  Administered 2016-04-20 – 2016-04-22 (×3): 40 mg via ORAL
  Filled 2016-04-19 (×3): qty 1

## 2016-04-19 NOTE — Progress Notes (Addendum)
Nutrition Follow-up   INTERVENTION:  3/6: Provide Jevity 1.5 @ 60 ml/hr continuous with 30 ml Pro-stat once daily to provide 2260 kcal, 107 grams of protein, and 1094 ml of water. Increase by 5 ml every 8 hours to goal rate of 75 ml/hr  3/7: Provide Jevity 1.5@75  ml/hr via J-tube for 20 hours daily with 30 ml Pro-stat once daily to provide 2350 kcal, 111 grams of protein, and 1140 ml of water.   Home Health Needs: 6 cans of Jevity 1.5 daily and 30 ml Pro-stat daily. Provide Jevity 1.5@75  ml/hr for 19 hrs daily and 30 ml Pro-stat daily to provide 2233 kcal, 106 grams of protein, and 1081 ml of fluid daily.   NUTRITION DIAGNOSIS:   Inadequate oral intake related to altered GI function as evidenced by other (see comment) (multiple past abdominal surgeries requiring nutrition support to meet needs).  ongoing  GOAL:   Patient will meet greater than or equal to 90% of their needs  Progressing  MONITOR:   TF tolerance, Weight trends, Labs, I & O's, Other (Comment) (TPN regimen)  REASON FOR ASSESSMENT:   Consult Enteral/tube feeding initiation and management  ASSESSMENT:   76 y.o. male  past mental history significant for chronic hypoxic respiratory failure, feeding tube, high blood pressure, low back pain, who presents emergency room with complaint shortness of breath. History mostly gathered from patient's wife as patient is demented at baseline. Per patient's wife patient has been in the hospital the last 2 months. Patient has been acutely short of breath for the last 2 days. Patient has been having a productive cough. Patient has had no fevers, chills, nausea, vomiting, diarrhea. No medications tried for patient's symptoms. Patient was brought to the emergency room for continuous complaints of shortness of breath.  Consult received to manage TF. TF order changed to Jevity 1.5 @60  ml/hr this morning. Pt previously receiving 50 ml/hr. RN reports that patient is tolerating TF well so far  without complaints of abdominal pain.  Discussed plan with pt/wife later this morning. Pt continues to deny abdominal pain. Wife requested instructions on how many cans of TF to provide daily at home.   Labs: low chloride, low calcium, elevated BUN, low hemoglobin, elevated phosphorus  Diet Order:  TPN Mackie Pai(CLINIMIX 5/15) Adult with electrolyte additivesNPO  Skin:  Reviewed, no issues  Last BM:  3/5  Height:   Ht Readings from Last 1 Encounters:  04/15/16 6' (1.829 m)    Weight:   Wt Readings from Last 1 Encounters:  04/19/16 170 lb 1.6 oz (77.2 kg)    Ideal Body Weight:  80.91 kg  BMI:  Body mass index is 23.07 kg/m.  Estimated Nutritional Needs:   Kcal:  2150-2320  Protein:  100-116 grams  Fluid:  2 L/day  EDUCATION NEEDS:   No education needs identified at this time  Dorothea Ogleeanne Patterson Hollenbaugh RD, LDN, CSP Inpatient Clinical Dietitian Pager: [phone] After Hours Pager: [phone]

## 2016-04-19 NOTE — Progress Notes (Signed)
Progress Note  Patient Name: [name] Date of Encounter: 04/19/2016  Primary Cardiologist: Dr Katrinka Blazing 04/2013  Patient Profile     76 y.o. male w/ hx BMS RCA 1997 and LAD DES 2000, HTN, HLD, chronic respiratory failure, recent complex GI procedure, has ostomy and feeding tube D/C from inpatient rehab 03/30/16. Admitted 03/02 w/ dyspnea x 4 days.  Subjective   Breathing better, no chest pain or palpitations. Not aware of tachycardia or skipped beats  Inpatient Medications    Scheduled Meds: . Alprazolam  0.5 mg Per Tube QHS,MR X 1  . aspirin  325 mg Oral Daily  . carvedilol  12.5 mg Oral BID WC  . chlorhexidine  15 mL Mouth Rinse BID  . cycloSPORINE  1 drop Both Eyes BID  . FLUoxetine  20 mg Per Tube Daily  . furosemide  80 mg Intravenous BID AC  . guaiFENesin  10 mL Oral Q8H  . ipratropium-albuterol  3 mL Nebulization TID  . lidocaine  1 patch Transdermal Q24H  . lip balm  1 application Topical BID  . liver oil-zinc oxide   Topical BID  . mouth rinse  15 mL Mouth Rinse q12n4p  . multivitamin  15 mL Per Tube Daily  . nystatin  5 mL Per Tube QID  . omeprazole  40 mg Per Tube Daily  . piperacillin-tazobactam (ZOSYN)  IV  3.375 g Intravenous Q8H  . potassium chloride  30 mEq Per Tube Once  . sodium chloride flush  3 mL Intravenous Q12H   Continuous Infusions: . sodium chloride    . feeding supplement (JEVITY 1.5 CAL/FIBER)     PRN Meds: albuterol, HYDROcodone-acetaminophen, menthol-cetylpyridinium, nitroGLYCERIN, polyvinyl alcohol, prochlorperazine, sodium chloride flush   Vital Signs    Vitals:   04/18/16 2041 04/18/16 2139 04/19/16 0614 04/19/16 0802  BP:  107/68 134/90   Pulse:  84 86   Resp:  18 18   Temp:  97.6 F (36.4 C) 98.4 F (36.9 C)   TempSrc:  Oral Oral   SpO2: 97% 95% 95% 98%  Weight:   170 lb 1.6 oz (77.2 kg)   Height:        Intake/Output Summary (Last 24 hours) at 04/19/16 0827 Last data filed at 04/19/16 0810  Gross per 24 hour    Intake           647.97 ml  Output             2500 ml  Net         -1852.03 ml   Filed Weights   04/17/16 0544 04/18/16 0550 04/19/16 0614  Weight: 180 lb 6.4 oz (81.8 kg) 174 lb 6.4 oz (79.1 kg) 170 lb 1.6 oz (77.2 kg)    Telemetry    SR, fewer and episodes of ?SVT vs atrial tach than previous, also w/ PACs - Personally Reviewed  ECG    n/a - Personally Reviewed  Physical Exam   General: elderly male, chronically ill-appearing, in no acute distress. Head: Normocephalic, atraumatic.  Neck: Supple without bruits, JVD minimal elevation. Lungs:  Resp regular and unlabored, decreased BS bases with some rales. Heart: Irreg R&R, S1, S2, no S3, S4, soft murmur; no rub. Abdomen: Soft, non-tender, non-distended with normoactive bowel sounds. No hepatomegaly. No rebound/guarding. No obvious abdominal masses. Extremities: No clubbing, cyanosis, no edema. Distal pedal pulses are 2+ bilaterally. Neuro: Alert and oriented X 3. Moves all extremities spontaneously. Psych: Normal affect.  Labs    Hematology  Recent Labs Lab 04/17/16 0444 04/18/16 0447 04/19/16 0424  WBC 8.6 8.2 9.9  RBC 3.54* 3.61* 3.73*  HGB 10.5* 10.8* 11.3*  HCT 33.7* 33.9* 34.6*  MCV 95.2 93.9 92.8  MCH 29.7 29.9 30.3  MCHC 31.2 31.9 32.7  RDW 15.9* 15.5 15.3  PLT 309 306 318    Chemistry Recent Labs Lab 04/15/16 1031  04/17/16 0444 04/18/16 0447 04/19/16 0424  NA 150*  < > 145 140 137  K 3.0*  < > 3.7 3.8 3.5  CL 115*  < > 107 100* 97*  CO2 26  < > 34* 35* 32  GLUCOSE 106*  < > 109* 125* 111*  BUN 25*  < > 21* 23* 29*  CREATININE 0.75  < > 0.93 1.02 1.07  CALCIUM 8.4*  < > 8.5* 8.4* 8.4*  PROT 6.2*  --  6.5 6.8  --   ALBUMIN 2.0*  --  1.9* 1.9*  --   AST 23  --  29 27  --   ALT 22  --  23 22  --   ALKPHOS 113  --  120 117  --   BILITOT 0.3  --  0.4 0.3  --   GFRNONAA >60  < > >60 >60 >60  GFRAA >60  < > >60 >60 >60  ANIONGAP 9  < > 4* 5 8  < > = values in this interval not displayed.    Cardiac Enzymes  Recent Labs Lab 04/15/16 1031 04/15/16 1400 04/15/16 1950 04/16/16 0157  TROPONINI 0.07* 0.06* 0.06* 0.06*    BNP  Recent Labs Lab 04/15/16 1031  BNP 1,832.6*     Radiology    Dg Chest 2 View Result Date: 04/15/2016 CLINICAL DATA:  Shortness of breath over the last week. EXAM: CHEST  2 VIEW COMPARISON:  02/28/2016 FINDINGS: Left arm PICC tip in the SVC 3 cm above the right atrium. Bilateral pleural effusions with dependent pulmonary atelectasis and/or pneumonia. Possible fluid overload/ interstitial edema. No acute bone finding. IMPRESSION: Bilateral effusions with dependent atelectasis/lower lung pneumonia. Possible fluid overload/ CHF as well. Electronically Signed   By: Paulina Fusi M.D.   On: 04/15/2016 11:24   Ct Chest Wo Contrast Result Date: 04/15/2016 CLINICAL DATA:  Acute respiratory failure, dyspnea for a few months EXAM: CT CHEST WITHOUT CONTRAST TECHNIQUE: Multidetector CT imaging of the chest was performed following the standard protocol without IV contrast. COMPARISON:  CXR 04/15/2016 and 02/28/2016 FINDINGS: Cardiovascular: PICC line noted with tip in the distal SVC. Normal size cardiac chambers without pericardial effusion. Coronary arteriosclerosis along the LAD and circumflex. Aortic atherosclerosis. No aortic aneurysm. Mediastinum/Nodes: No supraclavicular, axillary nor mediastinal lymphadenopathy. Normal branch pattern of the great vessels with minimal atherosclerosis at the origins. No thyromegaly or nodules. The mainstem bronchi and trachea are unremarkable apart from some minimal mucous along the dependent aspect of the distal trachea and proximal left mainstem bronchus. Lungs/Pleura: Small to moderate bilateral pleural effusions left greater than right with compressive atelectasis. Trace fluid extends into both major fissures. Centrilobular emphysema. Mild peribronchial thickening consistent with inflammation/ bronchitic change. Upper Abdomen:  Partially visualized GJ tube. No acute abdominal abnormality to the extent visualized. Musculoskeletal: No chest wall mass or suspicious bone lesions identified. IMPRESSION: 1. Small to moderate bilateral pleural effusions with adjacent compressive atelectasis. 2. Mild bilateral peribronchial thickening suggesting inflammation/bronchitic change. 3. Coronary arteriosclerosis and aortic atherosclerosis. Electronically Signed   By: Tollie Eth M.D.   On: 04/15/2016 13:29  Cardiac Studies   ECHO: 04/15/2016 - Left ventricle: The cavity size was normal. Wall thickness was   increased in a pattern of mild LVH. Systolic function was normal.   The estimated ejection fraction was in the range of 50% to 55%.   Diffuse hypokinesis. Doppler parameters are consistent with   abnormal left ventricular relaxation (grade 1 diastolic dysfunction). - Aortic valve: There was mild regurgitation. Impressions: - Extremely limited; definity used; normal LV systolic function;   grade 1 diastolic function; mild AI.   CATH: n/a   Patient Profile     76 y.o. male w/ hx BMS RCA 1997 and LAD DES 2000, HTN, HLD, chronic respiratory failure, recent complex GI procedure, has ostomy and feeding tube D/C from inpatient rehab 03/30/16. Admitted 03/02 w/ dyspnea x 4 days.  Assessment & Plan    1. Acute diastolic heart failure - BNP 1832 on admit. Echo this admission showed normal LV systolic function; grade 1 diastolic function; mild AI. His albumin level is 1.9.  - IV lasix 40mg  BID>>80 mg IV BID on 03/04.  - Will change to po today, not on diuretic pta, will start with 40 mg daily. Has already had IV Lasix this am - Continue supplemental potassium.  I & O net neg 7.9 L since admit - wt down 22 lbs - BUN/Cr trending up slightly, got IV Lasix this am, now po, reassess in am  2. Elevated troponin - Flat trend. Demand ischemia in setting of acute illness and CHF.  - no further eval planned, EF nl on echo  3.  CAD s/p BMS to RCA in 1997 and DES to LAD in 2000 - No angina.  Last Myoview 04/2013 normal.   4. HTN - Elevated BP had IV PRN hydralazine.  - improved w/ diuresis  5. Dysphagia -  On feeding tube - per IM  6.  Arrhythmia: NSVT (nonsustained ventricular tachycardia) (HCC), mainly w/ episodes narrow complex tachycardia - on Coreg 6.25 mg bid - no wheezing, no baseline bradycardia - increased Coreg to 12.5 mg bid  Otherwise, per IM Principal Problem:   Respiratory failure, acute (HCC) Active Problems:   Essential hypertension, benign   Hyperlipidemia   Anxiety state   Tobacco abuse   Hypernatremia   Hypokalemia   Elevated troponin   Acute on chronic diastolic heart failure (HCC)   SOB (shortness of breath)   Respiratory failure (HCC)  Signed, Theodore DemarkBarrett, Rhonda , PA-C 8:27 AM 04/19/2016 Pager: [phone]  Patient seen, examined. Available data reviewed. Agree with findings, assessment, and plan as outlined by Theodore Demarkhonda Barrett, PA-C. The patient is independently interviewed and examined. His wife is at the bedside. Lungs are clear bilaterally with diminished breath sounds at the bases. JVP is not elevated. Heart is regular rate and rhythm without murmur. Abdomen is soft and nontender. Extremities with trace bilateral pretibial edema.  The patient's BUN/creatinine ratios increasing and chloride is decreasing, clinical picture somewhat suggestive of intravascular volume contraction. Will transition him to oral furosemide 40 mg daily. Otherwise plan as outlined above. Patient is stable from a cardiac perspective please call if any further questions arise.  Tonny BollmanMichael Rabiah Goeser, M.D. 04/19/2016 11:40 AM

## 2016-04-19 NOTE — Progress Notes (Signed)
Pt was extremely unsteady on feet this a.m. Had to catch pt before falling [facility] Using bedpan for remainder of shift for pt and staff safety.

## 2016-04-19 NOTE — Clinical Social Work Note (Signed)
CSW met with patient. Wife at bedside. CSW introduced role and explained that PT recommendations would be discussed. Patient's wife refusing SNF. She wants him to go to either CIR or home with home health. Patient's wife states that he has plenty of support at home to care for him there. RNCM notified.   CSW signing off. Consult again if any other social work needs arise.  [name], [place]

## 2016-04-19 NOTE — Progress Notes (Addendum)
[name]  [birth year] [id number]  Patient Care Team: [name], MD as PCP - General (Internal Medicine) Karie Soda, MD as Consulting Physician (General Surgery) Willis Modena, MD as Consulting Physician (Gastroenterology) Coralyn Helling, MD as Consulting Physician (Pulmonary Disease)  Called by pharmacy.  Hoping to wean off TNA completely.  Already 1/2 rate.  Patient is at goal with his tube feeds for 24hrs.  Seems to be some discrepancy between what is this patient's actual goal of tube feeds.  Most likely related to the concentration of formula given.  Overall I think it would be a good idea to get him off the TNA as long as he is getting 100% nutrition.  Would simplify care and less volume issues since he can get into heart failure with fluid overload.   See my recommendations ase noted yesterday.  Hopefully gradually transition to bolus feeds through gastrostomy tube if he can tolerate continuous feeds for at least 48 hours.  We'll make sure he can tolerate enteral nutritional first.  Nutritional services on board and seem motivated to meet these goals.  Pharmacy on board as well  Called by patient's primary care physician, Dr. Valentina Lucks.  Dr. Valentina Lucks concerned that the patient with mental and swallowing decline over the past year by the patient could be evidence of ALS.  Has not fully discussed with the patient or family just yet.    Dr. Valentina Lucks wanted to have neurology see the patient again for more workup.  Family has been wanting neurology re-evaluation as well.  That was being set up until this readmission with heart failure.  The patient would benefit from ALS workup including EMG evaluation.  This unfortunately could explain some of his mental and swallowing decline beyond the severe deconditioning of his numerous surgeries.  I agree that could be a possibility.  I will defer to Dr. Valentina Lucks to discuss with the patient and family 1st.  Discuss with neurology.  ?Discuss with rehabilitation  as well.  Ardeth Sportsman, M.D., F.A.C.S. Gastrointestinal and Minimally Invasive Surgery [facility] [address], [address] [place], [place] [address] [phone] Main / Paging    Patient Active Problem List   Diagnosis Date Noted  . 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 Protein-calorie malnutrition, severe 12/04/2015    Priority: High  . Jejunal anastomotic leak 03/10/2016    Priority: Medium  . Gastritis 12/09/2015    Priority: Medium  . 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 Respiratory failure (HCC) 04/16/2016  . SOB (shortness of breath)   . NSVT (nonsustained ventricular tachycardia) (HCC)   . Elevated troponin 04/15/2016  . Acute on chronic diastolic heart failure (HCC)   . Secondary hypertension   . Gastrojejunostomy tube dislodgement (HCC)   . Gastric dysmotility   . Hypoalbuminemia   . Gastrojejunostomy tube 02/06/2016 03/10/2016  . Hyponatremia   . Anxiety and depression   . Sleep disturbance   . Orthostasis   . Leukocytosis   . Hyperglycemia   . FTT (failure to thrive) in adult   . Anemia of chronic disease 01/31/2016  . Stage 2 skin ulcer of sacral region 01/31/2016  . Hypomagnesemia 01/21/2016  . Hypokalemia 01/21/2016  . Pressure injury of skin 01/20/2016  . Pleural effusion   . Gastroesophageal reflux disease 01/19/2016  . Insomnia 01/19/2016  . On total parenteral nutrition (TPN) 01/17/2016  .  Metabolic acidosis 01/17/2016  . Skin ulcer of abdominal wall, limited to breakdown of skin (HCC) 01/17/2016  . Dysphagia 01/17/2016  . Debility 01/17/2016  . 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 Vitamin B12 deficiency 01/02/2016  . BPH (benign prostatic hyperplasia)   . Posterior vitreous detachment   . Salzmann's nodular dystrophy of left eye   . Hyperopia of both eyes with astigmatism and  presbyopia   . Central pterygium of right eye   . Pressure ulcer of buttock, right, unstageable (HCC) 12/30/2015  . Hypernatremia 12/28/2015  . Tobacco abuse 12/09/2015  . Respiratory failure, acute (HCC)   . Encephalopathy acute 12/03/2015  . Anxiety state 09/28/2015  . Hyperlipidemia   . Coronary atherosclerosis of native coronary artery 04/24/2013  . Essential hypertension, benign 04/24/2013  . Other and unspecified hyperlipidemia 04/24/2013  . Central pterygium 10/30/2012  . 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 Cataract, nuclear sclerotic senile 10/30/2012    Past Medical History:  Diagnosis Date  . Aspiration pneumonia (HCC) 01/17/2016  . Bilateral dry eyes   . Bile peritonitis due to gastric tube dislodgement 12/03/2015  . BPH (benign prostatic hyperplasia)   . Central pterygium of right eye   . Chronic respiratory failure with hypoxia (HCC)   . Coronary artery disease 1997, 2009   bare mental stent right coronart artery, occlusive followed [place]. Garnette [place]ank Smith [place]  . Degenerative disc disease    LOWER BACK  . Dysphonia   . ED (erectile dysfunction)   . Gastric outlet obstruction 11/24/2015  . Gastroesophageal reflux disease   . Hyperlipidemia   . Hyperopia of both eyes with astigmatism and presbyopia   . Hypertension   . Ischemic right index finger at tip 12/09/2015  . Nuclear senile cataract    [facility] [place]  . Peptic ulcer   . Pneumatosis of intestines 12/11/2015  . Pneumonia    HISTORY OF IN CHILDHOOD  . Posterior vitreous detachment 10/30/2012  . Pyloric stricture 11/24/2015  . Salzmann's nodular dystrophy of left eye   . SBO (small bowel obstruction) 01/20/2016  . Urinary hesitancy   . Wears glasses     Past Surgical History:  Procedure Laterality Date  . CARDIAC CATHETERIZATION    . CHOLECYSTECTOMY    . ESOPHAGOGASTRODUODENOSCOPY N/A 12/01/2015   Procedure: ESOPHAGOGASTRODUODENOSCOPY  (EGD) BALLOON DILATION OF DUODENAL STRICTURE;  Surgeon: Karie SodaSteven Marilea Gwynne, MD;  [facility];  Service: General;  Laterality: N/A;  . ESOPHAGOGASTRODUODENOSCOPY N/A 12/14/2015   Procedure: ESOPHAGOGASTRODUODENOSCOPY (EGD);  Surgeon: Vida RiggerMarc Magod, MD;  [facility];  Service: Endoscopy;  Laterality: N/A;  Patient has tracheostomy  . ESOPHAGOGASTRODUODENOSCOPY (EGD) WITH PROPOFOL N/A 07/01/2015   Procedure: ESOPHAGOGASTRODUODENOSCOPY (EGD) WITH PROPOFOL;  Surgeon: Willis ModenaWilliam Outlaw, MD;  [facility];  Service: Endoscopy;  Laterality: N/A;  . ESOPHAGOGASTRODUODENOSCOPY (EGD) WITH PROPOFOL N/A 11/23/2015   Procedure: ESOPHAGOGASTRODUODENOSCOPY (EGD) WITH PROPOFOL;  Surgeon: Willis ModenaWilliam Outlaw, MD;  [facility];  Service: Endoscopy;  Laterality: N/A;  may need to intubate  . EYE SURGERY     growth on right eye removed   . IR GENERIC HISTORICAL  12/13/2015   IR REPLC DUODEN/JEJUNO TUBE PERCUT W/FLUORO 12/13/2015 Jolaine ClickArthur Hoss, MD [facility]  . IR GENERIC HISTORICAL  02/03/2016   IR REPLC GASTRO/COLONIC TUBE PERCUT W/FLUORO 02/03/2016 Berdine DanceMichael Shick, MD [facility]  . IR GENERIC HISTORICAL  02/04/2016   IR GASTR TUBE CONVERT GASTR-JEJ PER W/FL MOD SED 02/04/2016 Malachy MoanHeath McCullough, MD [facility]  .  IR GENERIC HISTORICAL  02/25/2016   IR SINUS/FIST TUBE CHK-NON GI 02/25/2016 Irish Lack, MD [facility]  . IR GENERIC HISTORICAL  03/18/2016   IR CM INJ ANY COLONIC TUBE W/FLUORO 03/18/2016 Simonne Come, MD [facility]  . IR GENERIC HISTORICAL  03/18/2016   IR GJ TUBE CHANGE 03/18/2016 Simonne Come, MD [facility]  . IR GENERIC HISTORICAL  03/22/2016   IR CM INJ ANY COLONIC TUBE W/FLUORO 03/22/2016 Simonne Come, MD [facility]  . IR GENERIC HISTORICAL  04/13/2016   IR CM INJ ANY COLONIC TUBE W/FLUORO 04/13/2016 Richarda Overlie, MD [facility]  . LAPAROSCOPIC GASTROSTOMY N/A 12/01/2015   Procedure: LAPAROSCOPIC PLACEMENT OF FEEDING JEJUNOSTOMY AND GASTROSTOMY TUBE;  Surgeon: Karie Soda, MD;   [facility];  Service: General;  Laterality: N/A;  . LAPAROSCOPY N/A 12/03/2015   Procedure: LAPAROSCOPY DIAGNOSTIC, OMENTOPEXY, JEJUNOSTOMY, WASH OUT;  Surgeon: Karie Soda, MD;  [facility];  Service: General;  Laterality: N/A;  . LAPAROTOMY N/A 02/06/2016   Procedure: EXPLORATORY LAPAROTOMY, LYSIS OF ADHESIONS, SEGMENTAL SMALL BOWEL RESECTION, GASTROJEJUNOSTOMY;  Surgeon: Chevis Pretty III, MD;  [facility];  Service: General;  Laterality: N/A;  . STENT TO HEART     2 1997 AND 1 IN 1996  . TONSILLECTOMY    . XI ROBOTIC VAGOTOMY AND ANTRECTOMY N/A 09/25/2015   Procedure: XI ROBOTIC ANTERIOR AND POSTERIOR VAGOTOMY, BILROTH I  ANASTOMOSIS DOR FUNDIPLICATION, OMENTOPEXY  UPPER ENDOSCOPY;  Surgeon: Karie Soda, MD;  [facility];  Service: General;  Laterality: N/A;    Social History   Social History  . Marital status: Married    Spouse name: N/A  . Number of children: N/A  . Years of education: N/A   Occupational History  . Not on file.   Social History Main Topics  . Smoking status: Former Smoker    Packs/day: 2.00    Years: 20.00    Types: Cigarettes    Quit date: 02/15/1995  . Smokeless tobacco: Never Used  . Alcohol use No  . Drug use: No  . Sexual activity: No   Other Topics Concern  . Not on file   Social History Narrative  . No narrative on file    Family History  Problem Relation Age of Onset  . Heart attack Mother     Current Facility-Administered Medications  Medication Dose Route Frequency Provider Last Rate Last Dose  . 0.9 %  sodium chloride infusion   Intravenous Continuous Lorre Nick, MD      . albuterol (PROVENTIL) (2.5 MG/3ML) 0.083% nebulizer solution 2.5 mg  2.5 mg Nebulization Q2H PRN Haydee Salter, MD      . Alprazolam CONC 0.5 mg  0.5 mg Per Tube QHS,MR X 1 Haydee Salter, MD   0.5 mg at 04/18/16 2327  . aspirin tablet 325 mg  325 mg Oral Daily Haydee Salter, MD   325 mg at 04/19/16 0945  . carvedilol (COREG) tablet 12.5  mg  12.5 mg Oral BID WC Rhonda G Barrett, PA-C      . chlorhexidine (PERIDEX) 0.12 % solution 15 mL  15 mL Mouth Rinse BID Haydee Salter, MD   15 mL at 04/19/16 0946  . cycloSPORINE (RESTASIS) 0.05 % ophthalmic emulsion 1 drop  1 drop Both Eyes BID Haydee Salter, MD   1 drop at 04/19/16 0944  . feeding supplement (JEVITY 1.5 CAL/FIBER) liquid 1,000 mL  1,000 mL Per Tube Continuous Ripudeep Jenna Luo, MD      . [  START ON 04/20/2016] feeding supplement (JEVITY 1.5 CAL/FIBER) liquid 1,000 mL  1,000 mL Per Tube Continuous Ripudeep K Rai, MD      . feeding supplement (PRO-STAT SUGAR FREE 64) liquid 30 mL  30 mL Per Tube Daily Ripudeep K Rai, MD      . FLUoxetine (PROZAC) 20 MG/5ML solution 20 mg  20 mg Per Tube Daily Haydee Salter, MD   20 mg at 04/19/16 0943  . [START ON 04/20/2016] furosemide (LASIX) tablet 40 mg  40 mg Oral Q24H Tonny Bollman, MD      . guaiFENesin (ROBITUSSIN) 100 MG/5ML solution 200 mg  10 mL Oral Q8H Haydee Salter, MD   200 mg at 04/18/16 0549  . HYDROcodone-acetaminophen (HYCET) 7.5-325 mg/15 ml solution 10 mL  10 mL Per Tube Q4H PRN Michael Litter, MD   10 mL at 04/17/16 1238  . ipratropium-albuterol (DUONEB) 0.5-2.5 (3) MG/3ML nebulizer solution 3 mL  3 mL Nebulization TID Ripudeep Jenna Luo, MD   3 mL at 04/19/16 0802  . lidocaine (LIDODERM) 5 % 1 patch  1 patch Transdermal Q24H Haydee Salter, MD   1 patch at 04/18/16 2000  . lip balm (BLISTEX) ointment 1 application  1 application Topical BID Haydee Salter, MD   1 application at 04/19/16 [phone]  . liver oil-zinc oxide (DESITIN) 40 % ointment   Topical BID Haydee Salter, MD   1 application at 04/19/16 [phone]  . MEDLINE mouth rinse  15 mL Mouth Rinse q12n4p Haydee Salter, MD   15 mL at 04/18/16 1600  . menthol-cetylpyridinium (CEPACOL) lozenge 3 mg  1 lozenge Oral PRN Haydee Salter, MD      . multivitamin liquid 15 mL  15 mL Per Tube Daily Gerilyn Nestle, RPH   15 mL at 04/19/16 0948  . nitroGLYCERIN (NITROSTAT) SL tablet 0.4 mg   0.4 mg Sublingual Q5 min PRN Haydee Salter, MD      . nystatin (MYCOSTATIN) 100000 UNIT/ML suspension 500,000 Units  5 mL Per Tube QID Haydee Salter, MD   500,000 Units at 04/19/16 [phone]  . omeprazole (PRILOSEC) 2 mg/mL oral suspension SUSP 40 mg  40 mg Per Tube Daily Haydee Salter, MD   40 mg at 04/19/16 0944  . piperacillin-tazobactam (ZOSYN) IVPB 3.375 g  3.375 g Intravenous Q8H Ripudeep K Rai, MD   3.375 g at 04/19/16 0300  . polyvinyl alcohol (LIQUIFILM TEARS) 1.4 % ophthalmic solution 1 drop  1 drop Both Eyes Q8H PRN Haydee Salter, MD      . potassium chloride (KLOR-CON) packet 40 mEq  40 mEq Oral Once Cherre Huger, [facility]      . prochlorperazine (COMPAZINE) suppository 25 mg  25 mg Rectal Q12H PRN Haydee Salter, MD      . sodium chloride flush (NS) 0.9 % injection 10-40 mL  10-40 mL Intracatheter PRN Haydee Salter, MD      . sodium chloride flush (NS) 0.9 % injection 3 mL  3 mL Intravenous Q12H Haydee Salter, MD   3 mL at 04/19/16 0947  . TPN (CLINIMIX 5/15) Adult with electrolyte additives   Intravenous Continuous TPN Cherre Huger, [facility]         Allergies  Allergen Reactions  . Flomax [Tamsulosin Hcl] Other (See Comments)    Leg weakness  . Lisinopril Cough  . Lorazepam Other (See Comments)    "i don't like it"  Prefers Xanax or valium  .  Uroxatral [Alfuzosin Hcl Er] Other (See Comments)    Leg weakness    BP 134/90 (BP Location: Right Arm)   Pulse 86   Temp 98.4 F (36.9 C) (Oral)   Resp 18   Ht 6' (1.829 m)   Wt 77.2 kg (170 lb 1.6 oz)   SpO2 98%   BMI 23.07 kg/m   Dg Chest 2 View  Result Date: 04/15/2016 CLINICAL DATA:  Shortness of breath over the last week. EXAM: CHEST  2 VIEW COMPARISON:  02/28/2016 FINDINGS: Left arm PICC tip in the SVC 3 cm above the right atrium. Bilateral pleural effusions with dependent pulmonary atelectasis and/or pneumonia. Possible fluid overload/ interstitial edema. No acute bone finding. IMPRESSION: Bilateral effusions with  dependent atelectasis/lower lung pneumonia. Possible fluid overload/ CHF as well. Electronically Signed   By: Paulina Fusi M.D.   On: 04/15/2016 11:24   Ct Chest Wo Contrast  Result Date: 04/15/2016 CLINICAL DATA:  Acute respiratory failure, dyspnea for a few months EXAM: CT CHEST WITHOUT CONTRAST TECHNIQUE: Multidetector CT imaging of the chest was performed following the standard protocol without IV contrast. COMPARISON:  CXR 04/15/2016 and 02/28/2016 FINDINGS: Cardiovascular: PICC line noted with tip in the distal SVC. Normal size cardiac chambers without pericardial effusion. Coronary arteriosclerosis along the LAD and circumflex. Aortic atherosclerosis. No aortic aneurysm. Mediastinum/Nodes: No supraclavicular, axillary nor mediastinal lymphadenopathy. Normal branch pattern of the great vessels with minimal atherosclerosis at the origins. No thyromegaly or nodules. The mainstem bronchi and trachea are unremarkable apart from some minimal mucous along the dependent aspect of the distal trachea and proximal left mainstem bronchus. Lungs/Pleura: Small to moderate bilateral pleural effusions left greater than right with compressive atelectasis. Trace fluid extends into both major fissures. Centrilobular emphysema. Mild peribronchial thickening consistent with inflammation/ bronchitic change. Upper Abdomen: Partially visualized GJ tube. No acute abdominal abnormality to the extent visualized. Musculoskeletal: No chest wall mass or suspicious bone lesions identified. IMPRESSION: 1. Small to moderate bilateral pleural effusions with adjacent compressive atelectasis. 2. Mild bilateral peribronchial thickening suggesting inflammation/bronchitic change. 3. Coronary arteriosclerosis and aortic atherosclerosis. Electronically Signed   By: Tollie Eth M.D.   On: 04/15/2016 13:29   Ir Cm Inj Any Colonic Tube W/fluoro  Result Date: 04/13/2016 INDICATION: 76 year old with a GJ feeding tube. The jejunal lumen has been  occluded for few days. Patient presents for tube evaluation and possible exchange. EXAM: GI TUBE INJECTION MEDICATIONS: None ANESTHESIA/SEDATION: None CONTRAST:  20 mL Omnipaque 300 - administered into the gastric lumen. FLUOROSCOPY TIME:  Fluoroscopy Time: 30 seconds, 1.4 mGy COMPLICATIONS: None immediate. PROCEDURE: Informed written consent was obtained from the patient's daughter after a thorough discussion of the procedural risks, benefits and alternatives. All questions were addressed. Maximal Sterile Barrier Technique was utilized including caps, mask, sterile gowns, sterile gloves, sterile drape, hand hygiene and skin antiseptic. A timeout was performed prior to the initiation of the procedure. Patient was placed supine on the interventional table. The jejunal lumen was clogged. The lumen was flushed with a 10 cc syringe and was successfully unclogged. Contrast injection confirmed tube placement in the small bowel. The gastric lumen was also injected and confirmed placement in the stomach. Retention balloon remains inflated within the stomach. Patient's heart rate was irregular when he presented. Patient was placed on a monitor and appeared to have frequent premature atrial contractions. This finding was discussed with the patient's PCP, Dr. [name]. Patient was asymptomatic. IMPRESSION: The GJ tube was successfully unclogged. Jejunal tube is appropriately positioned  in the small bowel and ready for use. Electronically Signed   By: Richarda Overlie M.D.   On: 04/13/2016 10:26   Ir Cm Inj Any Colonic Tube W/fluoro  Result Date: 03/22/2016 INDICATION: Inadvertent retraction of gastrojejunostomy catheter. EXAM: FLUOROSCOPIC GUIDED INJECTION OF GASTROJEJUNOSTOMY TUBE COMPARISON:  Fluoroscopic guided gastrojejunostomy catheter exchange - 03/18/2016 MEDICATIONS: None. CONTRAST:  20mL ISOVUE-300 IOPAMIDOL (ISOVUE-300) INJECTION 61% - administered into the stomach and small bowel FLUOROSCOPY TIME:  36 seconds (2  mGy) COMPLICATIONS: None. PROCEDURE: The patient was positioned supine on the fluoroscopy table. Contrast was injected via both the gastrostomy and jejunostomy lumens demonstrating appropriate position functionality of both lumens. The gastrostomy balloon is normally positioned against the ventral wall of the gastric lumen. Both lumens were flushed with a small amount of saline and capped. A dressing was placed. The patient tolerated procedure well without immediate postprocedural complication. IMPRESSION: Appropriately positioned and functioning and gastrojejunostomy catheter. No exchange performed. Electronically Signed   By: Simonne Come M.D.   On: 03/22/2016 17:26   Dg Swallowing Func-speech Pathology  Result Date: 03/25/2016 Objective Swallowing Evaluation: Type of Study: MBS-Modified Barium Swallow Study Patient Details Name: [name] MRN: [id number] Date of Birth: [birth year] Today's Date: 03/25/2016 Time: SLP Start Time (ACUTE ONLY): 1100-SLP Stop Time (ACUTE ONLY): 1130 SLP Time Calculation (min) (ACUTE ONLY): 30 min Past Medical History: Past Medical History: Diagnosis Date . Aspiration pneumonia (HCC) 01/17/2016 . Bilateral dry eyes  . Bile peritonitis due to gastric tube dislodgement 12/03/2015 . BPH (benign prostatic hyperplasia)  . Central pterygium of right eye  . Chronic respiratory failure with hypoxia (HCC)  . Coronary artery disease 1997, 2009  bare mental stent right coronart artery, occlusive followed by Dr. Garnette Scheuermann in [place] . Degenerative disc disease   LOWER BACK . Dysphonia  . ED (erectile dysfunction)  . Gastric outlet obstruction 11/24/2015 . Gastroesophageal reflux disease  . Hyperlipidemia  . Hyperopia of both eyes with astigmatism and presbyopia  . Hypertension  . Ischemic right index finger at tip 12/09/2015 . Nuclear senile cataract   [facility] in [place] . Peptic ulcer  . Pneumatosis of intestines 12/11/2015 . Pneumonia   HISTORY OF IN CHILDHOOD .  Posterior vitreous detachment 10/30/2012 . Pyloric stricture 11/24/2015 . Salzmann's nodular dystrophy of left eye  . SBO (small bowel obstruction) 01/20/2016 . Urinary hesitancy  . Wears glasses  Past Surgical History: Past Surgical History: Procedure Laterality Date . CARDIAC CATHETERIZATION   . CHOLECYSTECTOMY   . ESOPHAGOGASTRODUODENOSCOPY N/A 12/01/2015  Procedure: ESOPHAGOGASTRODUODENOSCOPY (EGD) BALLOON DILATION OF DUODENAL STRICTURE;  Surgeon: Karie Soda, MD;  [facility];  Service: General;  Laterality: N/A; . ESOPHAGOGASTRODUODENOSCOPY N/A 12/14/2015  Procedure: ESOPHAGOGASTRODUODENOSCOPY (EGD);  Surgeon: Vida Rigger, MD;  [facility];  Service: Endoscopy;  Laterality: N/A;  Patient has tracheostomy . ESOPHAGOGASTRODUODENOSCOPY (EGD) WITH PROPOFOL N/A 07/01/2015  Procedure: ESOPHAGOGASTRODUODENOSCOPY (EGD) WITH PROPOFOL;  Surgeon: Willis Modena, MD;  [facility];  Service: Endoscopy;  Laterality: N/A; . ESOPHAGOGASTRODUODENOSCOPY (EGD) WITH PROPOFOL N/A 11/23/2015  Procedure: ESOPHAGOGASTRODUODENOSCOPY (EGD) WITH PROPOFOL;  Surgeon: Willis Modena, MD;  [facility];  Service: Endoscopy;  Laterality: N/A;  may need to intubate . EYE SURGERY    growth on right eye removed  . IR GENERIC HISTORICAL  12/13/2015  IR REPLC DUODEN/JEJUNO TUBE PERCUT W/FLUORO 12/13/2015 [name], MD [facility] . IR GENERIC HISTORICAL  02/03/2016  IR REPLC GASTRO/COLONIC TUBE PERCUT W/FLUORO 02/03/2016 Berdine Dance, MD [facility] . IR GENERIC HISTORICAL  02/04/2016  IR GASTR TUBE CONVERT GASTR-JEJ PER W/FL MOD SED 02/04/2016 Malachy Moan, MD [facility] . IR GENERIC HISTORICAL  02/25/2016  IR SINUS/FIST TUBE CHK-NON GI 02/25/2016 Irish Lack, MD [facility] . IR GENERIC HISTORICAL  03/18/2016  IR CM INJ ANY COLONIC TUBE W/FLUORO 03/18/2016 Simonne Come, MD [facility] . IR GENERIC HISTORICAL  03/18/2016  IR GJ TUBE CHANGE 03/18/2016 Simonne Come, MD [facility] . IR GENERIC HISTORICAL   03/22/2016  IR CM INJ ANY COLONIC TUBE W/FLUORO 03/22/2016 Simonne Come, MD [facility] . LAPAROSCOPIC GASTROSTOMY N/A 12/01/2015  Procedure: LAPAROSCOPIC PLACEMENT OF FEEDING JEJUNOSTOMY AND GASTROSTOMY TUBE;  Surgeon: Karie Soda, MD;  [facility];  Service: General;  Laterality: N/A; . LAPAROSCOPY N/A 12/03/2015  Procedure: LAPAROSCOPY DIAGNOSTIC, OMENTOPEXY, JEJUNOSTOMY, WASH OUT;  Surgeon: Karie Soda, MD;  [facility];  Service: General;  Laterality: N/A; . LAPAROTOMY N/A 02/06/2016  Procedure: EXPLORATORY LAPAROTOMY, LYSIS OF ADHESIONS, SEGMENTAL SMALL BOWEL RESECTION, GASTROJEJUNOSTOMY;  Surgeon: Chevis Pretty III, MD;  [facility];  Service: General;  Laterality: N/A; . STENT TO HEART    2 1997 AND 1 IN 1996 . TONSILLECTOMY   . XI ROBOTIC VAGOTOMY AND ANTRECTOMY N/A 09/25/2015  Procedure: XI ROBOTIC ANTERIOR AND POSTERIOR VAGOTOMY, BILROTH I  ANASTOMOSIS DOR FUNDIPLICATION, OMENTOPEXY  UPPER ENDOSCOPY;  Surgeon: Karie Soda, MD;  [facility];  Service: General;  Laterality: N/A; HPI: DAIQUAN RESNIK is a 76 y.o. male with h/o CAD and GERD who presents to the Emergency Department from [facility] by EMS complaining of nausea, vomiting, and diarrhea onset onset two days ago, worsening yesterday. Pt also has associated mild abdominal pain. Per daughter, pt has been admitted to the hospital recently for surgery in his abdomen; he has been staying at [facility] since then. Daughter reported to MD that pt has been fed through a G tube which gave him diarrhea that eventually resolved with a change in formula.   Daughter also informed MD that pt gradually started feeding by mouth again x2 days with return of diarrhea along with nausea and vomiting. Daughter notes that the facility staff have tried to change his formula but with no relief to his symptoms.  Pt has known h/o dysphagia dysphonia, pna, peptic ulcer, GERD.  Last MBS done 12/29/15 showed moderate oropharyngeal  dysphagia that looked marginally worse than test ono 12/17/15.  Pt left on ice chips only after oral care.  CXR 12/6 showed LLL consolidation ? ATX or pna.   Subjective: pt awake in chair Assessment / Plan / Recommendation CHL IP CLINICAL IMPRESSIONS 03/25/2016 Therapy Diagnosis Moderate pharyngeal phase dysphagia Clinical Impression Pt presents with moderate sensorimotor pharyngeal dysphagia resulting in silent aspiration of nectar thick liquids. Swallow function continues to be delayed and weak complicated by very weak cough mechanism. This results in delayed swallow initiaiton to the valluclua, intermittent flash penetration, one occurance of shallow frank penetration after the swallow and significant residue at the pyriform sinuses that was silently aspirated after the swallow with nectar thick liquids. Pt unable to clear pharyngeal residue with cued cough or double swallow. After 4 nectar thick boluses, pt gagged  and hocked up pharyngeal residue. Two trials of thin liquids were presented via spoon with what appeared to be timely swallow initiation. Safety with thin liquids was difficult to assess as pt began to immediately excessively wretch. Trials were discontinued as excessive wretching continued. Pt didn't produce any vomitous. These same wretching behaviors are present in  ST session after consuming 2 to 3 tsp trials of thin liquids. If wretching had not taken place, swallow study could have continued with further trials of thin liquids. Pt continues to be at high risk for aspiration complicated by GI issues that are present after ~ 6 tsp boluses of liquids. As a results, I recommend that pt remain NPO with small sips of thin H2O allowed under free water protocol as pt tolerates. Pt would also benefit from respiratory muscle training to strengthen cough and speech intensity. Education provided to PA. Of note, pt with wretching during ST session after only 3 to 4 tsp trials. Pt appears to have a  gastrointestinal component that will affect his ability to return to PO intake for nutrition.  Impact on safety and function Risk for inadequate nutrition/hydration;Moderate aspiration risk   [facility] IP TREATMENT RECOMMENDATION 03/25/2016 Treatment Recommendations Therapy as outlined in treatment plan below   Prognosis 03/25/2016 Prognosis for Safe Diet Advancement Guarded Barriers to Reach Goals Cognitive deficits;Time post onset;Severity of deficits;Other (Comment) Barriers/Prognosis Comment -- [facility] IP DIET RECOMMENDATION 03/25/2016 SLP Diet Recommendations Free water protocol after oral care Liquid Administration via Spoon Medication Administration Via alternative means Compensations Small sips/bites Postural Changes Remain semi-upright after after feeds/meals (Comment);Seated upright at 90 degrees   CH[facility] IP OTHER RECOMMENDATIONS 03/25/2016 Recommended Consults Consider GI evaluation Oral Care Recommendations Oral care QID Other Recommendations Have oral suction available   [facility] IP FOLLOW UP RECOMMENDATIONS 03/25/2016 Follow up Recommendations Skilled Nursing facility   [facility] IP FREQUENCY AND DURATION 01/25/2016 Speech Therapy Frequency (ACUTE ONLY) min 2x/week Treatment Duration 1 week      [facility]L IP ORAL PHASE 03/25/2016 Oral Phase WFL Oral - Pudding Teaspoon -- Oral - Pudding Cup -- Oral - Honey Teaspoon -- Oral - Honey Cup -- Oral - Nectar Teaspoon WFL Oral - Nectar Cup WFL Oral - Nectar Straw NT Oral - Thin Teaspoon WFL Oral - Thin Cup WFL Oral - Thin Straw NT Oral - Puree NT Oral - Mech Soft NT Oral - Regular -- Oral - Multi-Consistency -- Oral - Pill -- Oral Phase - Comment --  [facility] IP PHARYNGEAL PHASE 03/25/2016 Pharyngeal Phase Impaired Pharyngeal- Pudding Teaspoon -- Pharyngeal -- Pharyngeal- Pudding Cup -- Pharyngeal -- Pharyngeal- Honey Teaspoon -- Pharyngeal -- Pharyngeal- Honey Cup -- Pharyngeal -- Pharyngeal- Nectar Teaspoon Delayed swallow initiation-vallecula;Penetration/Apiration after swallow;Significant aspiration  (Amount);Pharyngeal residue - pyriform;Pharyngeal residue - valleculae;Pharyngeal residue - posterior pharnyx;Reduced pharyngeal peristalsis Pharyngeal Material enters airway, passes BELOW cords without attempt by patient to eject out (silent aspiration) Pharyngeal- Nectar Cup Delayed swallow initiation-vallecula;Reduced pharyngeal peristalsis;Penetration/Apiration after swallow;Significant aspiration (Amount);Pharyngeal residue - pyriform;Pharyngeal residue - valleculae;Pharyngeal residue - posterior pharnyx;Compensatory strategies attempted (with notebox) Pharyngeal Material enters airway, passes BELOW cords without attempt by patient to eject out (silent aspiration) Pharyngeal- Nectar Straw NT Pharyngeal -- Pharyngeal- Thin Teaspoon Delayed swallow initiation-vallecula;Pharyngeal residue - pyriform Pharyngeal Material enters airway, remains ABOVE vocal cords then ejected out Pharyngeal- Thin Cup NT Pharyngeal -- Pharyngeal- Thin Straw NT Pharyngeal -- Pharyngeal- Puree NT Pharyngeal -- Pharyngeal- Mechanical Soft -- Pharyngeal -- Pharyngeal- Regular NT Pharyngeal -- Pharyngeal- Multi-consistency -- Pharyngeal -- Pharyngeal- Pill -- Pharyngeal -- Pharyngeal Comment --  CHL IP CERVICAL ESOPHAGEAL PHASE 01/25/2016 Cervical Esophageal Phase Impaired Pudding Teaspoon -- Pudding Cup -- Honey Teaspoon -- Honey Cup -- Nectar Teaspoon Reduced cricopharyngeal relaxation Nectar Cup Reduced cricopharyngeal relaxation Nectar Straw -- Thin Teaspoon Reduced cricopharyngeal relaxation Thin Cup Reduced cricopharyngeal relaxation Thin Straw Reduced cricopharyngeal relaxation Puree Reduced cricopharyngeal relaxation;Esophageal  backflow into cervical esophagus Mechanical Soft -- Regular -- Multi-consistency -- Pill -- Cervical Esophageal Comment -- No flowsheet data found. Happi B. Dreama Saa M.S., CCC-SLP Speech-Language Pathologist Happi Dreama Saa 03/25/2016, 12:53 PM               Note: This dictation was prepared with  Dragon/digital dictation along with [facility] technology. Any transcriptional errors that result from this process are unintentional.   .Ardeth Sportsman, M.D., F.A.C.S. Gastrointestinal and Minimally Invasive Surgery [facility] [address] [place], [place] [address] [phone] Main / Paging  04/19/2016 12:48 PM

## 2016-04-19 NOTE — Progress Notes (Signed)
Pharmacy Antibiotic Note  [name] is a 76 y.o. male admitted on 04/15/2016 with possible aspiration pneumonia. Pharmacy was consulted on 04/16/16 for Zosyn dosing.  Continues on day # 4 of Zosyn 3.375 g IV q8h for pneumonia. Patient is afebrile with a normal wbc. Scr  is 1.07, trending up slightly, normalized CrCl ~60-65 ml/min.   Plan: Continue Zosyn 3.375g IV q8h (4 hour infusion).  Monitor clinical improvement and renal function   Height: 6' (182.9 cm) Weight: 170 lb 1.6 oz (77.2 kg) IBW/kg (Calculated) : 77.6  Temp (24hrs), Avg:97.9 F (36.6 C), Min:97.6 F (36.4 C), Max:98.4 F (36.9 C)   Recent Labs Lab 04/15/16 1031  04/16/16 0157 04/16/16 0441 04/17/16 0444 04/18/16 0447 04/19/16 0424  WBC 7.5  --   --  8.2 8.6 8.2 9.9  CREATININE 0.75  < > 0.76 0.81 0.93 1.02 1.07  < > = values in this interval not displayed.  Estimated Creatinine Clearance: 64.1 mL/min (by C-G formula based on SCr of 1.07 mg/dL).    Allergies  Allergen Reactions  . Flomax [Tamsulosin Hcl] Other (See Comments)    Leg weakness  . Lisinopril Cough  . Lorazepam Other (See Comments)    "i don't like it"  Prefers Xanax or valium  . Uroxatral [Alfuzosin Hcl Er] Other (See Comments)    Leg weakness    Antimicrobials this admission:  3/2 Azithro + CTX x 1 dose Zosyn 3/3 >>  Thank you for allowing pharmacy to be a part of this patient's care.  Noah Delaineuth Angel Weedon, RPh Clinical Pharmacist Pager: [phone] Main Pharmacy [phone]  04/19/2016 9:57 AM

## 2016-04-19 NOTE — Progress Notes (Addendum)
PHARMACY - ADULT TOTAL PARENTERAL NUTRITION CONSULT NOTE   Pharmacy Consult:  TPN Indication:  EC fistula  Patient Measurements: Height: 6' (182.9 cm) Weight: 170 lb 1.6 oz (77.2 kg) IBW/kg (Calculated) : 77.6 TPN AdjBW (KG): 87.3 Body mass index is 23.07 kg/m.  Assessment:  [age] YOM presented on 04/15/16 with trouble breathing. He has a recent history of multiple procedures and extended hospitalization for recurrent partial GOO 2/2 pyloric stricture. Patient was on TF and TPN PTA for nutrition. Pharmacy consulted to continue chronic TPN while inpatient.  GI: has closed drain for abdomen and PEG tube. Prealbumin WNL at 19.2. Albumin 1.9. LBM 3/5 Endo: no hx DM - did not need SSI in the past.  Glucose controlled. Lytes: all WNL except CL at 97.  Free H20 for hyperNa (Na down to 137) Renal: BPH - SCr 0.69 > 1.07, BUN 29, UOP marginal yesterday 0.4 ml/kg/hr, net -8L since admit. IVF KVO. Pulm: chronic resp failure on 2L [place] - guaifenesin, Duonebs Cards: CAD / HLD / HTN / dCHF (EF 50-55%) - VSS - ASA, increasing Coreg, Lasix IV>PO Hepatobil: LFTs / tbili / TG WNL Neuro: baseline dementia, DJD - fluoxetine, alprazolam, lidocaine patch ID: Zosyn / Nystatin powder - afebrile, WBC WNL. Best Practices: SCDs, MC TPN Access: PICC single lumen 04/15/16 TPN start date: chronic TPN  Nutritional Goals (per RD recommendation on 3/5): 2020-2260 kCal and 100-115 gm protein per day  Current Nutrition:  TPN Jevity 1.5 at 60 ml/hr (2160 kCal and 91 gm protein)  [facility] TPN Formula: 1661 kCal and 78 g of protein (250 g of dextrose, 50 g of lipids) TE in TPN   Plan:  - Change Clinimix 5/15 with electrolyte additives to 25 ml/hr continuous infusion.  Hold lipid with full enteral nutrition. - Continue Jevity 1.2 at 60 ml/hr per MD (goal 75 ml/hr) - TPN + TF will provide 2586 kCal and 121 g protein per day, meeting >100% of kCal and protein needs.  - Daily multivitamin PT - No trace elements  in TPN as patient is on EN - KCL 40mEq PT - F/U AM labs (BMP) - Monitor Cal-Phos product  - F/U with order to discontinue TPN in AM  York CeriseKatherine Cook, PharmD Pharmacy Resident  Pager [phone] 04/19/16 10:05 AM  Chelsea Aushuy D. Laney Potashang, PharmD, BCPS Pager:  [phone] 04/19/2016, 11:08 AM

## 2016-04-19 NOTE — Evaluation (Signed)
Occupational Therapy Evaluation Patient Details Name: [name] MRN: [id number] DOB: 1940/11/04 Today's Date: 04/19/2016    History of Present Illness Pt is a 76 yo male with PMH significant for chronic hypoxic respiratory failure, NSVT, dysphagia, feeding tube, HTN, LBP, dementia, HLD, gastric outlet obstruction, aspiration pneumonia, small bowel obstruction who presented to ED on 04/15/16 for SOB. Pt recently d/c from CIR on 2/14. CXR shows bilat pleural effusions. Echo shows EF 50-55%.    Clinical Impression   PTA, pt returned home from CIR and was home with wife for nine days before presenting with weakness and returning to hospital. Pt received A for dressing and bathing and would performing functional mobility with RW and wife assistance/supervision. Currently, pt has general weakness and decreased balance. Pt performs grooming at EOB with Mod A for seated balance due to postural lean. Pt requires Mod A and RW for functional mobility. Pt would benefit from skilled acute OT to increase occupational participation and performance. Recommend CIR for intense therapy to increase independence and safety as well as decreasing caregiver burden.     Follow Up Recommendations  CIR;Supervision/Assistance - 24 hour    Equipment Recommendations  None recommended by OT    Recommendations for Other Services Rehab consult     Precautions / Restrictions Precautions Precautions: Fall Precaution Comments: NPO, gastrostomy tube; watch O2 and HR Restrictions Weight Bearing Restrictions: No      Mobility Bed Mobility Overal bed mobility: Needs Assistance Bed Mobility: Supine to Sit     Supine to sit: Min assist;HOB elevated     General bed mobility comments: Pt benefited from Mod VCs to sequence  Transfers Overall transfer level: Needs assistance Equipment used: Rolling walker (2 wheeled) Transfers: Sit to/from Stand Sit to Stand: Mod assist;+2 safety/equipment         General  transfer comment: Pt performed sit to stand with Mod A for balance once standing; pt demonstrated good hand placement to perform transfer    Balance Overall balance assessment: Needs assistance Sitting-balance support: Feet supported Sitting balance-Leahy Scale: Fair Sitting balance - Comments: Pt able to maintain sitting balance initially' required Mod A for postural lean during ADLs Postural control: Posterior lean Standing balance support: Bilateral upper extremity supported;During functional activity Standing balance-Leahy Scale: Poor Standing balance comment: required Mod A                            ADL Overall ADL's : Needs assistance/impaired Eating/Feeding: NPO   Grooming: Wash/dry face;Applying deodorant;Brushing hair;Moderate assistance;Sitting Grooming Details (indicate cue type and reason): Pt able to perform grooming at EOB, but needed more support for balance as task progressed Upper Body Bathing: Moderate assistance;Sitting   Lower Body Bathing: Maximal assistance;Sitting/lateral leans   Upper Body Dressing : Moderate assistance;Sitting   Lower Body Dressing: Maximal assistance;Sitting/lateral leans   Toilet Transfer: Moderate assistance;+2 for safety/equipment;Stand-pivot;BSC;RW (simulated to recliner)   Toileting- Clothing Manipulation and Hygiene: Moderate assistance;Sitting/lateral lean       Functional mobility during ADLs: Moderate assistance;+2 for safety/equipment;Rolling walker General ADL Comments: Pt performed grooming at EOB with decreased dynamic sitting balance. Pt performed functional mobility to recliner with Mod A and RW for decreased balance.      Vision Baseline Vision/History: Wears glasses Wears Glasses: Reading only       Perception     Praxis      Pertinent Vitals/Pain Pain Assessment: Faces Faces Pain Scale: Hurts even more Pain Location: Lower back  Pain Descriptors / Indicators: Discomfort;Grimacing (With  activity) Pain Intervention(s): Monitored during session     Hand Dominance Left (writes with right)   Extremity/Trunk Assessment Upper Extremity Assessment Upper Extremity Assessment: Generalized weakness   Lower Extremity Assessment Lower Extremity Assessment: Generalized weakness       Communication Communication Communication: Expressive difficulties   Cognition Arousal/Alertness: Awake/alert Behavior During Therapy: WFL for tasks assessed/performed Overall Cognitive Status: Impaired/Different from baseline Area of Impairment: Memory;Following commands;Safety/judgement;Problem solving     Memory: Decreased short-term memory Following Commands: Follows one step commands with increased time Safety/Judgement: Decreased awareness of deficits   Problem Solving: Slow processing;Decreased initiation     General Comments  VSS; SpO2 dropped to 92 during activity    Exercises       Shoulder Instructions      Home Living Family/patient expects to be discharged to:: Private residence Living Arrangements: Spouse/significant other Available Help at Discharge: Family;Available 24 hours/day;Home health Type of Home: House Home Access: Stairs to enter Entergy CorporationEntrance Stairs-Number of Steps: 3 Entrance Stairs-Rails: Left;Right Home Layout: One level     Bathroom Shower/Tub: Producer, television/film/videoWalk-in shower   Bathroom Toilet: Handicapped height     Home Equipment: Environmental consultantWalker - 2 wheels;Bedside commode;Shower seat;Grab bars - tub/shower;Adaptive equipment Adaptive Equipment: Reacher;Sock aid;Long-handled sponge;Long-handled shoe horn Additional Comments: Wife reports she will have as much help as needed upon d/c between family, friends, and HH services.   Lives With: Spouse    Prior Functioning/Environment Level of Independence: Needs assistance  Gait / Transfers Assistance Needed: Pt reports limited household amb with RW and assist from wife.  ADL's / Homemaking Assistance Needed: Requires assist  for bathing and LB dressing   Comments: Pt wife states they recieve a service (EV[employer]?) that is described like an aide; service comes from 7am till dinner time        OT Problem List: Decreased strength;Decreased range of motion;Decreased activity tolerance;Impaired balance (sitting and/or standing);Decreased coordination;Decreased safety awareness;Decreased knowledge of use of DME or AE;Decreased cognition;Pain      OT Treatment/Interventions: Self-care/ADL training;Therapeutic exercise;Energy conservation;Therapeutic activities;Patient/family education    OT Goals(Current goals can be found in the care plan section) Acute Rehab OT Goals Patient Stated Goal: Return home OT Goal Formulation: With patient Time For Goal Achievement: 05/03/16 Potential to Achieve Goals: Good ADL Goals Pt Will Perform Grooming: with supervision;with set-up;sitting Pt Will Perform Upper Body Bathing: with set-up;with supervision;sitting Pt Will Transfer to Toilet: with min guard assist;stand pivot transfer;bedside commode Pt Will Perform Toileting - Clothing Manipulation and hygiene: with set-up;with supervision;sitting/lateral leans  OT Frequency: Min 2X/week   Barriers to D/C:            Co-evaluation              End of Session Equipment Utilized During Treatment: Gait belt;Rolling walker;Oxygen Nurse Communication: Mobility status  Activity Tolerance: Patient tolerated treatment well Patient left: in chair;with call bell/phone within reach;with chair alarm set;with family/visitor present  OT Visit Diagnosis: Unsteadiness on feet (R26.81);Muscle weakness (generalized) (M62.81)                ADL either performed or assessed with clinical judgement  Time: 1057-1130 OT Time Calculation (min): 33 min Charges:  OT General Charges $OT Visit: 1 Procedure OT Evaluation $OT Eval Moderate Complexity: 1 Procedure OT Treatments $Self Care/Home Management : 8-22 mins G-Codes:     Charter CommunicationsCharis  Janika Jedlicka, OTR/L [phone]   Theodoro GristCharis M Alif Petrak 04/19/2016, 1:37 PM

## 2016-04-19 NOTE — Progress Notes (Signed)
[facility]                                                                              Patient Demographics  [name], is a 76 y.o. male, DOB - 12-26-1940, KGM:[id number]  Admit date - 04/15/2016   Admitting Physician Haydee Salter, MD  Outpatient Primary MD for the patient is Lillia Mountain, MD  Outpatient specialists:   LOS - 3  days    Chief Complaint  Patient presents with  . Weakness  . Shortness of Breath       Brief summary    [name] is a 76 y.o. male  past mental history significant for chronic hypoxic respiratory failure, feeding tube, high blood pressure, low back pain, Who presented to ED with complaints of shortness of breath, productive cough for the last 2 days. Patient has dementia at the baseline and history was provided by his wife.    Assessment & Plan    Principal problem Acute hypoxic respiratory failure likely secondary to aspiration and acute diastolic CHF - Chest x-ray showed bilateral pleural effusions with dependent atelectasis/lower lung pneumonia possible fluid overload/CHF - CT chest showed small to moderate bilateral pleural effusions, mild bilateral peribronchial thickening suggesting inflammation/bronchitic changes - Continue duo nebs, placed on IV Zosyn  Active problems Acute diastolic CHF -2-D echo 3/2 showed EF of 50-55%, grade 1 diastolic dysfunction with diffuse hypokinesis  - Placed on strict I's and O's, Negative balance of 8.6 L, patient was placed on aggressive IV Lasix for diuresis - Cardiology consulted, Lasix transitioned to oral from tomorrow, stable from cardiac standpoint   Elevated troponins - Possibly due to demand ischemia, cardiology following, 2-D echo with EF of 55% with grade 1 diastolic dysfunction, diffuse hypokinesis  Dysphagia with G-tube feeding, severe malnutrition, hypoalbuminemia/failure to thrive - Albumin 1.9 - Continue Jevity per nutrition - Placed on TPN per  pharmacy  Hypernatremia Increased free water flushes  Fungal infection  Cont Mycostatin 4 times a day  Mood disorder Prozac daily Xanax daily at bedtime  Back pain Continue Lidoderm patches  GERD Cont PPI  Code Status: full  DVT Prophylaxis:   SCD's Family Communication: Discussed in detail with the patient, all imaging results, lab results explained to the patient. Discussed with patient's wife in detail yesterday    Disposition Plan: Inpatient rehabilitation consult placed.   Time Spent in minutes 25 minutes  Procedures:   Consultants:   Cardiology  Antimicrobials:   IV Zosyn 3/3   Medications  Scheduled Meds: . Alprazolam  0.5 mg Per Tube QHS,MR X 1  . aspirin  325 mg Oral Daily  . carvedilol  12.5 mg Oral BID WC  . chlorhexidine  15 mL Mouth Rinse BID  . cycloSPORINE  1 drop Both Eyes BID  . feeding supplement (PRO-STAT SUGAR FREE 64)  30 mL Per Tube Daily  . FLUoxetine  20 mg Per Tube Daily  . [START ON 04/20/2016] furosemide  40 mg Oral Q24H  . guaiFENesin  10 mL Oral Q8H  . ipratropium-albuterol  3 mL Nebulization TID  . lidocaine  1 patch Transdermal Q24H  .  lip balm  1 application Topical BID  . liver oil-zinc oxide   Topical BID  . mouth rinse  15 mL Mouth Rinse q12n4p  . multivitamin  15 mL Per Tube Daily  . nystatin  5 mL Per Tube QID  . omeprazole  40 mg Per Tube Daily  . piperacillin-tazobactam (ZOSYN)  IV  3.375 g Intravenous Q8H  . potassium chloride  40 mEq Oral Once  . sodium chloride flush  3 mL Intravenous Q12H   Continuous Infusions: . sodium chloride    . feeding supplement (JEVITY 1.5 CAL/FIBER)    . [START ON 04/20/2016] feeding supplement (JEVITY 1.5 CAL/FIBER)    . TPN [facility] 5/15) Adult with electrolyte additives     PRN Meds:.albuterol, HYDROcodone-acetaminophen, menthol-cetylpyridinium, nitroGLYCERIN, polyvinyl alcohol, prochlorperazine, sodium chloride flush   Antibiotics   Anti-infectives    Start      Dose/Rate Route Frequency Ordered Stop   04/16/16 1000  piperacillin-tazobactam (ZOSYN) IVPB 3.375 g     3.375 g 12.5 mL/hr over 240 Minutes Intravenous Every 8 hours 04/16/16 0922     04/15/16 1645  azithromycin (ZITHROMAX) 500 mg in dextrose 5 % 250 mL IVPB  Status:  Discontinued     500 mg 250 mL/hr over 60 Minutes Intravenous Every 24 hours 04/15/16 1542 04/16/16 0922   04/15/16 1245  cefTRIAXone (ROCEPHIN) 1 g in dextrose 5 % 50 mL IVPB     1 g 100 mL/hr over 30 Minutes Intravenous  Once 04/15/16 1243 04/15/16 1812        Subjective:   [name] was seen and examined today. The patient denies any complaints. Still very deconditioned and weak. No peripheral edema, feels a lot better today, no shortness of breath. Has dementia and difficult to obtain review of systems from the patient.  No fevers. Denies any chest pain.     Objective:   Vitals:   04/18/16 2041 04/18/16 2139 04/19/16 0614 04/19/16 0802  BP:  107/68 134/90   Pulse:  84 86   Resp:  18 18   Temp:  97.6 F (36.4 C) 98.4 F (36.9 C)   TempSrc:  Oral Oral   SpO2: 97% 95% 95% 98%  Weight:   77.2 kg (170 lb 1.6 oz)   Height:        Intake/Output Summary (Last 24 hours) at 04/19/16 1237 Last data filed at 04/19/16 1047  Gross per 24 hour  Intake           647.97 ml  Output             2250 ml  Net         -1602.03 ml     Wt Readings from Last 3 Encounters:  04/19/16 77.2 kg (170 lb 1.6 oz)  03/30/16 74 kg (163 lb 2.3 oz)  02/26/16 78.6 kg (173 lb 4.5 oz)     Exam  General: Alert and oriented   HEENT:    Neck: Supple, no JVD  Cardiovascular: S1 S2 clear, RRR  Respiratory: Diminished breath sounds at the bases with rales   Gastrointestinal: Soft, nontender, nondistended, + bowel sounds, J tube  Ext: no cyanosis clubbing, no edema, PICC in left arm  Neuro: no new deficits  Skin: No rashes  Psych: Normal affect and demeanor, alert and oriented    Data Reviewed:  I have personally  reviewed following labs and imaging studies  Micro Results No results found for this or any previous visit (from the past 240  hour(s)).  Radiology Reports Dg Chest 2 View  Result Date: 04/15/2016 CLINICAL DATA:  Shortness of breath over the last week. EXAM: CHEST  2 VIEW COMPARISON:  02/28/2016 FINDINGS: Left arm PICC tip in the SVC 3 cm above the right atrium. Bilateral pleural effusions with dependent pulmonary atelectasis and/or pneumonia. Possible fluid overload/ interstitial edema. No acute bone finding. IMPRESSION: Bilateral effusions with dependent atelectasis/lower lung pneumonia. Possible fluid overload/ CHF as well. Electronically Signed   By: Paulina Fusi M.D.   On: 04/15/2016 11:24   Ct Chest Wo Contrast  Result Date: 04/15/2016 CLINICAL DATA:  Acute respiratory failure, dyspnea for a few months EXAM: CT CHEST WITHOUT CONTRAST TECHNIQUE: Multidetector CT imaging of the chest was performed following the standard protocol without IV contrast. COMPARISON:  CXR 04/15/2016 and 02/28/2016 FINDINGS: Cardiovascular: PICC line noted with tip in the distal SVC. Normal size cardiac chambers without pericardial effusion. Coronary arteriosclerosis along the LAD and circumflex. Aortic atherosclerosis. No aortic aneurysm. Mediastinum/Nodes: No supraclavicular, axillary nor mediastinal lymphadenopathy. Normal branch pattern of the great vessels with minimal atherosclerosis at the origins. No thyromegaly or nodules. The mainstem bronchi and trachea are unremarkable apart from some minimal mucous along the dependent aspect of the distal trachea and proximal left mainstem bronchus. Lungs/Pleura: Small to moderate bilateral pleural effusions left greater than right with compressive atelectasis. Trace fluid extends into both major fissures. Centrilobular emphysema. Mild peribronchial thickening consistent with inflammation/ bronchitic change. Upper Abdomen: Partially visualized GJ tube. No acute abdominal  abnormality to the extent visualized. Musculoskeletal: No chest wall mass or suspicious bone lesions identified. IMPRESSION: 1. Small to moderate bilateral pleural effusions with adjacent compressive atelectasis. 2. Mild bilateral peribronchial thickening suggesting inflammation/bronchitic change. 3. Coronary arteriosclerosis and aortic atherosclerosis. Electronically Signed   By: Tollie Eth M.D.   On: 04/15/2016 13:29   Ir Cm Inj Any Colonic Tube W/fluoro  Result Date: 04/13/2016 INDICATION: 76 year old with a GJ feeding tube. The jejunal lumen has been occluded for few days. Patient presents for tube evaluation and possible exchange. EXAM: GI TUBE INJECTION MEDICATIONS: None ANESTHESIA/SEDATION: None CONTRAST:  20 mL Omnipaque 300 - administered into the gastric lumen. FLUOROSCOPY TIME:  Fluoroscopy Time: 30 seconds, 1.4 mGy COMPLICATIONS: None immediate. PROCEDURE: Informed written consent was obtained from the patient's daughter after a thorough discussion of the procedural risks, benefits and alternatives. All questions were addressed. Maximal Sterile Barrier Technique was utilized including caps, mask, sterile gowns, sterile gloves, sterile drape, hand hygiene and skin antiseptic. A timeout was performed prior to the initiation of the procedure. Patient was placed supine on the interventional table. The jejunal lumen was clogged. The lumen was flushed with a 10 cc syringe and was successfully unclogged. Contrast injection confirmed tube placement in the small bowel. The gastric lumen was also injected and confirmed placement in the stomach. Retention balloon remains inflated within the stomach. Patient's heart rate was irregular when he presented. Patient was placed on a monitor and appeared to have frequent premature atrial contractions. This finding was discussed with the patient's PCP, Dr. [name]. Patient was asymptomatic. IMPRESSION: The GJ tube was successfully unclogged. Jejunal tube is  appropriately positioned in the small bowel and ready for use. Electronically Signed   By: Richarda Overlie M.D.   On: 04/13/2016 10:26   Ir Cm Inj Any Colonic Tube W/fluoro  Result Date: 03/22/2016 INDICATION: Inadvertent retraction of gastrojejunostomy catheter. EXAM: FLUOROSCOPIC GUIDED INJECTION OF GASTROJEJUNOSTOMY TUBE COMPARISON:  Fluoroscopic guided gastrojejunostomy catheter exchange - 03/18/2016 MEDICATIONS: None.  CONTRAST:  20mL ISOVUE-300 IOPAMIDOL (ISOVUE-300) INJECTION 61% - administered into the stomach and small bowel FLUOROSCOPY TIME:  36 seconds (2 mGy) COMPLICATIONS: None. PROCEDURE: The patient was positioned supine on the fluoroscopy table. Contrast was injected via both the gastrostomy and jejunostomy lumens demonstrating appropriate position functionality of both lumens. The gastrostomy balloon is normally positioned against the ventral wall of the gastric lumen. Both lumens were flushed with a small amount of saline and capped. A dressing was placed. The patient tolerated procedure well without immediate postprocedural complication. IMPRESSION: Appropriately positioned and functioning and gastrojejunostomy catheter. No exchange performed. Electronically Signed   By: Simonne Come M.D.   On: 03/22/2016 17:26   Dg Swallowing Func-speech Pathology  Result Date: 03/25/2016 Objective Swallowing Evaluation: Type of Study: MBS-Modified Barium Swallow Study Patient Details Name: TAVARUS POTEETE MRN: [id number] Date of Birth: 04-Nov-1940 Today's Date: 03/25/2016 Time: SLP Start Time (ACUTE ONLY): 1100-SLP Stop Time (ACUTE ONLY): 1130 SLP Time Calculation (min) (ACUTE ONLY): 30 min Past Medical History: Past Medical History: Diagnosis Date . Aspiration pneumonia (HCC) 01/17/2016 . Bilateral dry eyes  . Bile peritonitis due to gastric tube dislodgement 12/03/2015 . BPH (benign prostatic hyperplasia)  . Central pterygium of right eye  . Chronic respiratory failure with hypoxia (HCC)  . Coronary artery disease  1997, 2009  bare mental stent right coronart artery, occlusive followed by Dr. Garnette Scheuermann in [place] . Degenerative disc disease   LOWER BACK . Dysphonia  . ED (erectile dysfunction)  . Gastric outlet obstruction 11/24/2015 . Gastroesophageal reflux disease  . Hyperlipidemia  . Hyperopia of both eyes with astigmatism and presbyopia  . Hypertension  . Ischemic right index finger at tip 12/09/2015 . Nuclear senile cataract   Dr. [facility] in [place] . Peptic ulcer  . Pneumatosis of intestines 12/11/2015 . Pneumonia   HISTORY OF IN CHILDHOOD . Posterior vitreous detachment 10/30/2012 . Pyloric stricture 11/24/2015 . Salzmann's nodular dystrophy of left eye  . SBO (small bowel obstruction) 01/20/2016 . Urinary hesitancy  . Wears glasses  Past Surgical History: Past Surgical History: Procedure Laterality Date . CARDIAC CATHETERIZATION   . CHOLECYSTECTOMY   . ESOPHAGOGASTRODUODENOSCOPY N/A 12/01/2015  Procedure: ESOPHAGOGASTRODUODENOSCOPY (EGD) BALLOON DILATION OF DUODENAL STRICTURE;  Surgeon: Karie Soda, MD;  Location: [facility];  Service: General;  Laterality: N/A; . ESOPHAGOGASTRODUODENOSCOPY N/A 12/14/2015  Procedure: ESOPHAGOGASTRODUODENOSCOPY (EGD);  Surgeon: Vida Rigger, MD;  Location: [facility];  Service: Endoscopy;  Laterality: N/A;  Patient has tracheostomy . ESOPHAGOGASTRODUODENOSCOPY (EGD) WITH PROPOFOL N/A 07/01/2015  Procedure: ESOPHAGOGASTRODUODENOSCOPY (EGD) WITH PROPOFOL;  Surgeon: Willis Modena, MD;  Location: [facility];  Service: Endoscopy;  Laterality: N/A; . ESOPHAGOGASTRODUODENOSCOPY (EGD) WITH PROPOFOL N/A 11/23/2015  Procedure: ESOPHAGOGASTRODUODENOSCOPY (EGD) WITH PROPOFOL;  Surgeon: Willis Modena, MD;  Location: [facility] ENDOSCOPY;  Service: Endoscopy;  Laterality: N/A;  may need to intubate . EYE SURGERY    growth on right eye removed  . IR GENERIC HISTORICAL  12/13/2015  IR REPLC DUODEN/JEJUNO TUBE PERCUT W/FLUORO 12/13/2015 [name], MD [facility] . IR GENERIC HISTORICAL   02/03/2016  IR REPLC GASTRO/COLONIC TUBE PERCUT W/FLUORO 02/03/2016 Berdine Dance, MD [facility] . IR GENERIC HISTORICAL  02/04/2016  IR GASTR TUBE CONVERT GASTR-JEJ PER W/FL MOD SED 02/04/2016 Malachy Moan, MD [facility] . IR GENERIC HISTORICAL  02/25/2016  IR SINUS/FIST TUBE CHK-NON GI 02/25/2016 Irish Lack, MD [facility] . IR GENERIC HISTORICAL  03/18/2016  IR CM INJ ANY COLONIC TUBE W/FLUORO 03/18/2016 Simonne Come, MD [facility] . IR GENERIC HISTORICAL  03/18/2016  IR GJ TUBE CHANGE 03/18/2016 Simonne Come, MD [facility] . IR GENERIC HISTORICAL  03/22/2016  IR CM INJ ANY COLONIC TUBE W/FLUORO 03/22/2016 Simonne Come, MD [facility] . LAPAROSCOPIC GASTROSTOMY N/A 12/01/2015  Procedure: LAPAROSCOPIC PLACEMENT OF FEEDING JEJUNOSTOMY AND GASTROSTOMY TUBE;  Surgeon: Karie Soda, MD;  Location: [facility];  Service: General;  Laterality: N/A; . LAPAROSCOPY N/A 12/03/2015  Procedure: LAPAROSCOPY DIAGNOSTIC, OMENTOPEXY, JEJUNOSTOMY, WASH OUT;  Surgeon: Karie Soda, MD;  Location: [facility];  Service: General;  Laterality: N/A; . LAPAROTOMY N/A 02/06/2016  Procedure: EXPLORATORY LAPAROTOMY, LYSIS OF ADHESIONS, SEGMENTAL SMALL BOWEL RESECTION, GASTROJEJUNOSTOMY;  Surgeon: Chevis Pretty III, MD;  Location: [facility];  Service: General;  Laterality: N/A; . STENT TO HEART    2 1997 AND 1 IN 1996 . TONSILLECTOMY   . XI ROBOTIC VAGOTOMY AND ANTRECTOMY N/A 09/25/2015  Procedure: XI ROBOTIC ANTERIOR AND POSTERIOR VAGOTOMY, BILROTH I  ANASTOMOSIS DOR FUNDIPLICATION, OMENTOPEXY  UPPER ENDOSCOPY;  Surgeon: Karie Soda, MD;  Location: [facility];  Service: General;  Laterality: N/A; HPI: KYNAN PEASLEY is a 76 y.o. male with h/o CAD and GERD who presents to the Emergency Department from [facility] by EMS complaining of nausea, vomiting, and diarrhea onset onset two days ago, worsening yesterday. Pt also has associated mild abdominal pain. Per daughter, pt has been admitted to the hospital recently for surgery in his abdomen;  he has been staying at [facility] since then. Daughter reported to MD that pt has been fed through a G tube which gave him diarrhea that eventually resolved with a change in formula.   Daughter also informed MD that pt gradually started feeding by mouth again x2 days with return of diarrhea along with nausea and vomiting. Daughter notes that the facility staff have tried to change his formula but with no relief to his symptoms.  Pt has known h/o dysphagia dysphonia, pna, peptic ulcer, GERD.  Last MBS done 12/29/15 showed moderate oropharyngeal dysphagia that looked marginally worse than test ono 12/17/15.  Pt left on ice chips only after oral care.  CXR 12/6 showed LLL consolidation ? ATX or pna.   Subjective: pt awake in chair Assessment / Plan / Recommendation CHL IP CLINICAL IMPRESSIONS 03/25/2016 Therapy Diagnosis Moderate pharyngeal phase dysphagia Clinical Impression Pt presents with moderate sensorimotor pharyngeal dysphagia resulting in silent aspiration of nectar thick liquids. Swallow function continues to be delayed and weak complicated by very weak cough mechanism. This results in delayed swallow initiaiton to the valluclua, intermittent flash penetration, one occurance of shallow frank penetration after the swallow and significant residue at the pyriform sinuses that was silently aspirated after the swallow with nectar thick liquids. Pt unable to clear pharyngeal residue with cued cough or double swallow. After 4 nectar thick boluses, pt gagged  and hocked up pharyngeal residue. Two trials of thin liquids were presented via spoon with what appeared to be timely swallow initiation. Safety with thin liquids was difficult to assess as pt began to immediately excessively wretch. Trials were discontinued as excessive wretching continued. Pt didn't produce any vomitous. These same wretching behaviors are present in ST session after consuming 2 to 3 tsp trials of thin liquids. If wretching had not taken  place, swallow study could have continued with further trials of thin liquids. Pt continues to be at high risk for aspiration complicated by GI issues that are present after ~ 6 tsp boluses of liquids. As a results, I recommend that pt remain  NPO with small sips of thin H2O allowed under free water protocol as pt tolerates. Pt would also benefit from respiratory muscle training to strengthen cough and speech intensity. Education provided to PA. Of note, pt with wretching during ST session after only 3 to 4 tsp trials. Pt appears to have a gastrointestinal component that will affect his ability to return to PO intake for nutrition.  Impact on safety and function Risk for inadequate nutrition/hydration;Moderate aspiration risk   CHL IP TREATMENT RECOMMENDATION 03/25/2016 Treatment Recommendations Therapy as outlined in treatment plan below   Prognosis 03/25/2016 Prognosis for Safe Diet Advancement Guarded Barriers to Reach Goals Cognitive deficits;Time post onset;Severity of deficits;Other (Comment) Barriers/Prognosis Comment -- CHL IP DIET RECOMMENDATION 03/25/2016 SLP Diet Recommendations Free water protocol after oral care Liquid Administration via Spoon Medication Administration Via alternative means Compensations Small sips/bites Postural Changes Remain semi-upright after after feeds/meals (Comment);Seated upright at 90 degrees   CHL IP OTHER RECOMMENDATIONS 03/25/2016 Recommended Consults Consider GI evaluation Oral Care Recommendations Oral care QID Other Recommendations Have oral suction available   CHL IP FOLLOW UP RECOMMENDATIONS 03/25/2016 Follow up Recommendations [facility] D[facility] 01/25/2016 Speech Therapy Frequency (ACUTE ONLY) min 2x/week Treatment Duration 1 week      CHL IP ORAL PHASE 03/25/2016 Oral Phase WFL Oral - Pudding Teaspoon -- Oral - Pudding Cup -- Oral - Honey Teaspoon -- Oral - Honey Cup -- Oral - Nectar Teaspoon WFL Oral - Nectar Cup WFL Oral - Nectar Straw NT  Oral - Thin Teaspoon WFL Oral - Thin Cup WFL Oral - Thin Straw NT Oral - Puree NT Oral - Mech Soft NT Oral - Regular -- Oral - Multi-Consistency -- Oral - Pill -- Oral Phase - Comment --  CHL IP PHARYNGEAL PHASE 03/25/2016 Pharyngeal Phase Impaired Pharyngeal- Pudding Teaspoon -- Pharyngeal -- Pharyngeal- Pudding Cup -- Pharyngeal -- Pharyngeal- Honey Teaspoon -- Pharyngeal -- Pharyngeal- Honey Cup -- Pharyngeal -- Pharyngeal- Nectar Teaspoon Delayed swallow initiation-vallecula;Penetration/Apiration after swallow;Significant aspiration (Amount);Pharyngeal residue - pyriform;Pharyngeal residue - valleculae;Pharyngeal residue - posterior pharnyx;Reduced pharyngeal peristalsis Pharyngeal Material enters airway, passes BELOW cords without attempt by patient to eject out (silent aspiration) Pharyngeal- Nectar Cup Delayed swallow initiation-vallecula;Reduced pharyngeal peristalsis;Penetration/Apiration after swallow;Significant aspiration (Amount);Pharyngeal residue - pyriform;Pharyngeal residue - valleculae;Pharyngeal residue - posterior pharnyx;Compensatory strategies attempted (with notebox) Pharyngeal Material enters airway, passes BELOW cords without attempt by patient to eject out (silent aspiration) Pharyngeal- Nectar Straw NT Pharyngeal -- Pharyngeal- Thin Teaspoon Delayed swallow initiation-vallecula;Pharyngeal residue - pyriform Pharyngeal Material enters airway, remains ABOVE vocal cords then ejected out Pharyngeal- Thin Cup NT Pharyngeal -- Pharyngeal- Thin Straw NT Pharyngeal -- Pharyngeal- Puree NT Pharyngeal -- Pharyngeal- Mechanical Soft -- Pharyngeal -- Pharyngeal- Regular NT Pharyngeal -- Pharyngeal- Multi-consistency -- Pharyngeal -- Pharyngeal- Pill -- Pharyngeal -- Pharyngeal Comment --  CHL IP CERVICAL ESOPHAGEAL PHASE 01/25/2016 Cervical Esophageal Phase Impaired Pudding Teaspoon -- Pudding Cup -- Honey Teaspoon -- Honey Cup -- Nectar Teaspoon Reduced cricopharyngeal relaxation Nectar Cup Reduced  cricopharyngeal relaxation Nectar Straw -- Thin Teaspoon Reduced cricopharyngeal relaxation Thin Cup Reduced cricopharyngeal relaxation Thin Straw Reduced cricopharyngeal relaxation Puree Reduced cricopharyngeal relaxation;Esophageal backflow into cervical esophagus Mechanical Soft -- Regular -- Multi-consistency -- Pill -- Cervical Esophageal Comment -- No flowsheet data found. Happi B. Dreama Saaverton, M.S., CCC-SLP Speech-Language Pathologist Happi Overton 03/25/2016, 12:53 PM               Lab Data:  CBC:  Recent Labs Lab 04/15/16 1031 04/16/16 0441 04/17/16  7829 04/18/16 0447 04/19/16 0424  WBC 7.5 8.2 8.6 8.2 9.9  NEUTROABS 4.8  --  5.4 4.0  --   HGB 10.2* 10.2* 10.5* 10.8* 11.3*  HCT 32.4* 33.0* 33.7* 33.9* 34.6*  MCV 94.2 94.0 95.2 93.9 92.8  PLT 276 295 309 306 318   Basic Metabolic Panel:  Recent Labs Lab 04/15/16 1218  04/16/16 0157 04/16/16 0441 04/17/16 0444 04/18/16 0447 04/19/16 0424  NA  --   < > 147* 147* 145 140 137  K  --   < > 3.4* 3.2* 3.7 3.8 3.5  CL  --   < > 111 111 107 100* 97*  CO2  --   < > 30 30 34* 35* 32  GLUCOSE  --   < > 112* 117* 109* 125* 111*  BUN  --   < > 20 21* 21* 23* 29*  CREATININE  --   < > 0.76 0.81 0.93 1.02 1.07  CALCIUM  --   < > 8.5* 8.5* 8.5* 8.4* 8.4*  MG 1.7  --   --   --  2.0 1.9  --   PHOS 3.4  --   --   --  4.4 5.0* 3.8  < > = values in this interval not displayed. GFR: Estimated Creatinine Clearance: 64.1 mL/min (by C-G formula based on SCr of 1.07 mg/dL). Liver Function Tests:  Recent Labs Lab 04/15/16 1031 04/17/16 0444 04/18/16 0447  AST 23 29 27   ALT 22 23 22   ALKPHOS 113 120 117  BILITOT 0.3 0.4 0.3  PROT 6.2* 6.5 6.8  ALBUMIN 2.0* 1.9* 1.9*   No results for input(s): LIPASE, AMYLASE in the last 168 hours. No results for input(s): AMMONIA in the last 168 hours. Coagulation Profile: No results for input(s): INR, PROTIME in the last 168 hours. Cardiac Enzymes:  Recent Labs Lab 04/15/16 1031 04/15/16 1400  04/15/16 1950 04/16/16 0157  TROPONINI 0.07* 0.06* 0.06* 0.06*   BNP (last 3 results) No results for input(s): PROBNP in the last 8760 hours. HbA1C: No results for input(s): HGBA1C in the last 72 hours. CBG:  Recent Labs Lab 04/16/16 2110 04/19/16 1205  GLUCAP 112* 119*   Lipid Profile:  Recent Labs  04/17/16 0444 04/18/16 0447  TRIG 102 124   Thyroid Function Tests: No results for input(s): TSH, T4TOTAL, FREET4, T3FREE, THYROIDAB in the last 72 hours. Anemia Panel: No results for input(s): VITAMINB12, FOLATE, FERRITIN, TIBC, IRON, RETICCTPCT in the last 72 hours. Urine analysis:    Component Value Date/Time   COLORURINE YELLOW 02/28/2016 1036   APPEARANCEUR CLEAR 02/28/2016 1036   LABSPEC 1.018 02/28/2016 1036   PHURINE 5.0 02/28/2016 1036   GLUCOSEU NEGATIVE 02/28/2016 1036   HGBUR NEGATIVE 02/28/2016 1036   BILIRUBINUR NEGATIVE 02/28/2016 1036   KETONESUR NEGATIVE 02/28/2016 1036   PROTEINUR NEGATIVE 02/28/2016 1036   NITRITE NEGATIVE 02/28/2016 1036   LEUKOCYTESUR NEGATIVE 02/28/2016 1036     RAI,RIPUDEEP M.D. [facility] 04/19/2016, 12:37 PM  Pager: [phone] Between 7am to 7pm - call Pager - [phone]  After 7pm go to [email or web address] - password [email or web address]  Call night coverage person covering after 7pm

## 2016-04-19 NOTE — Consult Note (Signed)
Physical Medicine and Rehabilitation Consult   Reason for Consult: Debility with SOB Referring Physician: Dr. Isidoro Donning.    HPI: [name] is a 76 y.o. male with history of CAD, HTN, BPH, GERD, PUD with abdominal surgery with complicated prolonged hospital course with debility and CIR stay 1/12- 03/30/16. He was discharged to home on TNA with tube feeds that was slowly being increased. Family was providing supervision and patient was able to ambulate household distances with walker.  He has had problems with tube dislodging on 2/28 and electrolyte abnormality due to lack of nutrition. He was readmitted on 04/15/16  issues with SOB, electrolyte abnormalities and elevated troponin. He was placed on IV antibiotics due to concerns of aspiration and acute on chronic CHF treated with IV diuresis.  Dr. Michaell Cowing consulted for input and recommended weaning of TNA with start of bolus tube feeds.   PT evaluation done and limited by weakness and SOB. CIR recommended for follow up therapy.     Review of Systems  Constitutional: Positive for malaise/fatigue.  HENT: Negative for hearing loss and tinnitus.   Eyes: Negative for blurred vision and double vision.  Respiratory: Positive for shortness of breath.   Cardiovascular: Positive for leg swelling. Negative for chest pain and palpitations.  Gastrointestinal: Positive for abdominal pain. Negative for nausea and vomiting.  Genitourinary: Positive for urgency. Negative for dysuria and frequency.  Musculoskeletal: Positive for back pain and myalgias.  Neurological: Positive for speech change (dysphonia) and weakness. Negative for dizziness and focal weakness.  Psychiatric/Behavioral: The patient does not have insomnia.       Past Medical History:  Diagnosis Date  . Aspiration pneumonia (HCC) 01/17/2016  . Bilateral dry eyes   . Bile peritonitis due to gastric tube dislodgement 12/03/2015  . BPH (benign prostatic hyperplasia)   . Central pterygium of  right eye   . Chronic respiratory failure with hypoxia (HCC)   . Coronary artery disease 1997, 2009   bare mental stent right coronart artery, occlusive followed by Dr. Garnette Scheuermann in [place]  . Degenerative disc disease    LOWER BACK  . Dysphonia   . ED (erectile dysfunction)   . Gastric outlet obstruction 11/24/2015  . Gastroesophageal reflux disease   . Hyperlipidemia   . Hyperopia of both eyes with astigmatism and presbyopia   . Hypertension   . Ischemic right index finger at tip 12/09/2015  . Nuclear senile cataract    Dr. [facility] in [place]  . Peptic ulcer   . Pneumatosis of intestines 12/11/2015  . Pneumonia    HISTORY OF IN CHILDHOOD  . Posterior vitreous detachment 10/30/2012  . Pyloric stricture 11/24/2015  . Salzmann's nodular dystrophy of left eye   . SBO (small bowel obstruction) 01/20/2016  . Urinary hesitancy   . Wears glasses     Past Surgical History:  Procedure Laterality Date  . CARDIAC CATHETERIZATION    . CHOLECYSTECTOMY    . ESOPHAGOGASTRODUODENOSCOPY N/A 12/01/2015   Procedure: ESOPHAGOGASTRODUODENOSCOPY (EGD) BALLOON DILATION OF DUODENAL STRICTURE;  Surgeon: Karie Soda, MD;  Location: [facility];  Service: General;  Laterality: N/A;  . ESOPHAGOGASTRODUODENOSCOPY N/A 12/14/2015   Procedure: ESOPHAGOGASTRODUODENOSCOPY (EGD);  Surgeon: Vida Rigger, MD;  Location: [facility];  Service: Endoscopy;  Laterality: N/A;  Patient has tracheostomy  . ESOPHAGOGASTRODUODENOSCOPY (EGD) WITH PROPOFOL N/A 07/01/2015   Procedure: ESOPHAGOGASTRODUODENOSCOPY (EGD) WITH PROPOFOL;  Surgeon: Willis Modena, MD;  Location: [facility];  Service: Endoscopy;  Laterality: N/A;  . ESOPHAGOGASTRODUODENOSCOPY (  EGD) WITH PROPOFOL N/A 11/23/2015   Procedure: ESOPHAGOGASTRODUODENOSCOPY (EGD) WITH PROPOFOL;  Surgeon: Willis Modena, MD;  Location: [facility];  Service: Endoscopy;  Laterality: N/A;  may need to intubate  . EYE SURGERY     growth on right eye removed     . IR GENERIC HISTORICAL  12/13/2015   IR REPLC DUODEN/JEJUNO TUBE PERCUT W/FLUORO 12/13/2015 [name], MD [facility]  . IR GENERIC HISTORICAL  02/03/2016   IR REPLC GASTRO/COLONIC TUBE PERCUT W/FLUORO 02/03/2016 Berdine Dance, MD [facility]  . IR GENERIC HISTORICAL  02/04/2016   IR GASTR TUBE CONVERT GASTR-JEJ PER W/FL MOD SED 02/04/2016 Malachy Moan, MD [facility]  . IR GENERIC HISTORICAL  02/25/2016   IR SINUS/FIST TUBE CHK-NON GI 02/25/2016 Irish Lack, MD [facility]  . IR GENERIC HISTORICAL  03/18/2016   IR CM INJ ANY COLONIC TUBE W/FLUORO 03/18/2016 Simonne Come, MD [facility]  . IR GENERIC HISTORICAL  03/18/2016   IR GJ TUBE CHANGE 03/18/2016 Simonne Come, MD [facility]  . IR GENERIC HISTORICAL  03/22/2016   IR CM INJ ANY COLONIC TUBE W/FLUORO 03/22/2016 Simonne Come, MD [facility]  . IR GENERIC HISTORICAL  04/13/2016   IR CM INJ ANY COLONIC TUBE W/FLUORO 04/13/2016 Richarda Overlie, MD [facility]  . LAPAROSCOPIC GASTROSTOMY N/A 12/01/2015   Procedure: LAPAROSCOPIC PLACEMENT OF FEEDING JEJUNOSTOMY AND GASTROSTOMY TUBE;  Surgeon: Karie Soda, MD;  Location: [facility];  Service: General;  Laterality: N/A;  . LAPAROSCOPY N/A 12/03/2015   Procedure: LAPAROSCOPY DIAGNOSTIC, OMENTOPEXY, JEJUNOSTOMY, WASH OUT;  Surgeon: Karie Soda, MD;  Location: [facility];  Service: General;  Laterality: N/A;  . LAPAROTOMY N/A 02/06/2016   Procedure: EXPLORATORY LAPAROTOMY, LYSIS OF ADHESIONS, SEGMENTAL SMALL BOWEL RESECTION, GASTROJEJUNOSTOMY;  Surgeon: Chevis Pretty III, MD;  Location: [facility];  Service: General;  Laterality: N/A;  . STENT TO HEART     2 1997 AND 1 IN 1996  . TONSILLECTOMY    . XI ROBOTIC VAGOTOMY AND ANTRECTOMY N/A 09/25/2015   Procedure: XI ROBOTIC ANTERIOR AND POSTERIOR VAGOTOMY, BILROTH I  ANASTOMOSIS DOR FUNDIPLICATION, OMENTOPEXY  UPPER ENDOSCOPY;  Surgeon: Karie Soda, MD;  Location: [facility];  Service: General;  Laterality: N/A;    Family History  Problem Relation Age of Onset   . Heart attack Mother     Social History:  Married. Needed supervision with mobility and ADL tasks. Daughter had returned to work.  He  reports that he quit smoking about 21 years ago. His smoking use included Cigarettes. He has a 40.00 pack-year smoking history. He has never used smokeless tobacco. He reports that he does not drink alcohol or use drugs.     Allergies  Allergen Reactions  . Flomax [Tamsulosin Hcl] Other (See Comments)    Leg weakness  . Lisinopril Cough  . Lorazepam Other (See Comments)    "i don't like it"  Prefers Xanax or valium  . Uroxatral [Alfuzosin Hcl Er] Other (See Comments)    Leg weakness   Medications Prior to Admission  Medication Sig Dispense Refill  . Alprazolam 1 MG/ML CONC Take 0.5 mLs (0.5 mg total) by mouth at bedtime and may repeat dose one time if needed. (Patient taking differently: Place 0.5 mg into feeding tube at bedtime and may repeat dose one time if needed. ) 20 mL 0  . chlorhexidine (PERIDEX) 0.12 % solution 15 mLs by Mouth Rinse route 2 (two) times daily. 1893 mL 0  . cycloSPORINE (RESTASIS) 0.05 % ophthalmic emulsion Place 1  drop into both eyes 2 (two) times daily. 0.4 mL   . erythromycin ethylsuccinate (EES) 200 MG/5ML suspension Place 10 mLs (400 mg total) into feeding tube every 6 (six) hours. 1200 mL 0  . FLUoxetine (PROZAC) 20 MG/5ML solution GIVE 5ML INTO FEEDING TUBE ONCE DAILY. 120 mL 0  . lidocaine (LIDODERM) 5 % Apply to back at 7 pm and remove at 7 am daily 30 patch 0  . lip balm (CARMEX) ointment Apply 1 application topically 2 (two) times daily. 7 g 0  . liver oil-zinc oxide (DESITIN) 40 % ointment Apply topically 2 (two) times daily. 56.7 g 0  . magic mouthwash SOLN Take 15 mLs by mouth 4 (four) times daily as needed for mouth pain (sore throat). 300 mL 0  . menthol-cetylpyridinium (CEPACOL) 3 MG lozenge Take 1 lozenge (3 mg total) by mouth as needed for sore throat. 100 tablet 12  . Menthol-Methyl Salicylate (MUSCLE RUB)  10-15 % CREA Apply 1 application topically 3 (three) times daily before meals. 113 g 0  . mouth rinse LIQD solution 15 mLs by Mouth Rinse route 2 times daily at 12 noon and 4 pm.  0  . Multiple Vitamin (MULTIVITAMIN) LIQD Place 15 mLs into feeding tube daily.    . nitroGLYCERIN (NITROSTAT) 0.4 MG SL tablet Place 1 tablet (0.4 mg total) under the tongue every 5 (five) minutes as needed for chest pain.  12  . Nutritional Supplements (FEEDING SUPPLEMENT, JEVITY 1.2 CAL,) LIQD Place 1,000 mLs into feeding tube daily.  0  . nystatin (MYCOSTATIN) 100000 UNIT/ML suspension Place 30 mLs into feeding tube 4 (four) times daily.    [name]. omeprazole (PRILOSEC) 2 mg/mL SUSP Place 20 mLs (40 mg total) into feeding tube daily. 600 mL 0  . polyvinyl alcohol (LIQUIFILM TEARS) 1.4 % ophthalmic solution Place 1 drop into both eyes every 8 (eight) hours as needed (dry). 15 mL 0  . prochlorperazine (COMPAZINE) 25 MG suppository Place 1 suppository (25 mg total) rectally every 12 (twelve) hours as needed for nausea or vomiting. 12 suppository 0  . sodium chloride flush (NS) 0.9 % SOLN 10-40 mLs by Intracatheter route as needed (flush).      Home: Home Living Family/patient expects to be discharged to:: Private residence Living Arrangements: Spouse/significant other Available Help at Discharge: Family, Available 24 hours/day, Home health Type of Home: House Home Access: Stairs to enter Entergy CorporationEntrance Stairs-Number of Steps: 3 Entrance Stairs-Rails: Left, Right Home Layout: One level Bathroom Shower/Tub: Health visitorWalk-in shower Bathroom Toilet: Handicapped height Home Equipment: Environmental consultantWalker - 2 wheels, Bedside commode, Shower seat, Grab bars - tub/shower, Scientist, physiologicalAdaptive equipment Adaptive Equipment: Reacher, Sock aid, Long-handled sponge, Long-handled shoe horn Additional Comments: Wife reports she will have as much help as needed upon d/c between family, friends, and HH services.   Lives With: Spouse  Functional History: Prior  Function Level of Independence: Needs assistance Gait / Transfers Assistance Needed: Pt reports limited household amb with RW and assist from wife.  ADL's / Homemaking Assistance Needed: Requires assist for bathing and LB dressing Comments: EVPs? there throughout the day Functional Status:  Mobility: Bed Mobility Overal bed mobility: Needs Assistance Bed Mobility: Supine to Sit Supine to sit: Min assist, HOB elevated General bed mobility comments: Pt benefited from Mod VCs to sequence Transfers Overall transfer level: Needs assistance Equipment used: Rolling walker (2 wheeled) Transfers: Sit to/from Stand Sit to Stand: Mod assist, +2 safety/equipment General transfer comment: Pt performed sit to stand with Mod A for balance once  standing; pt demonstrated good hand placement to perform transfer      ADL: ADL Overall ADL's : Needs assistance/impaired Eating/Feeding: NPO Grooming: Wash/dry face, Applying deodorant, Brushing hair, Moderate assistance, Sitting Grooming Details (indicate cue type and reason): Pt able to perform grooming at EOB, but needed more support for balance as task progressed Upper Body Bathing: Moderate assistance, Sitting Lower Body Bathing: Maximal assistance, Sitting/lateral leans Upper Body Dressing : Moderate assistance, Sitting Lower Body Dressing: Maximal assistance, Sitting/lateral leans Toilet Transfer: Moderate assistance, +2 for safety/equipment, Stand-pivot, BSC, RW (simulated to recliner) Toileting- Clothing Manipulation and Hygiene: Moderate assistance, Sitting/lateral lean Functional mobility during ADLs: Moderate assistance, +2 for safety/equipment, Rolling walker General ADL Comments: Pt performed grooming at EOB with decreased dynamic sitting balance. Pt performed functional mobility to recliner with Mod A and RW for decreased balance.   Cognition: Cognition Overall Cognitive Status: Impaired/Different from baseline Orientation Level:  Oriented X4 Cognition Arousal/Alertness: Awake/alert Behavior During Therapy: WFL for tasks assessed/performed Overall Cognitive Status: Impaired/Different from baseline Area of Impairment: Memory, Following commands, Safety/judgement, Problem solving Memory: Decreased short-term memory Following Commands: Follows one step commands with increased time Safety/Judgement: Decreased awareness of deficits Problem Solving: Slow processing, Decreased initiation   Blood pressure (!) 99/54, pulse 63, temperature 97.4 F (36.3 C), temperature source Oral, resp. rate 18, height 6' (1.829 m), weight 77.2 kg (170 lb 1.6 oz), SpO2 97 %. Physical Exam  Nursing note and vitals reviewed. Constitutional: He is oriented to person, place, and time. He appears well-developed and well-nourished. No distress. Nasal cannula in place.  Sitting up in chair.   HENT:  Head: Normocephalic and atraumatic.  Mouth/Throat: Oropharynx is clear and moist.  Eyes: Conjunctivae are normal. Pupils are equal, round, and reactive to light.  Neck: Normal range of motion. Neck supple.  Cardiovascular: Normal rate and regular rhythm.   Respiratory: Effort normal. No stridor. He has decreased breath sounds in the right lower field and the left lower field. He exhibits no tenderness.  GI: Soft. Bowel sounds are normal. He exhibits distension. There is no tenderness.  Dry dressing on midline incision. J-tube infusing at 75 cc/hr.  Musculoskeletal: He exhibits edema.  Neurological: He is alert and oriented to person, place, and time.  Alert and more interactive (compaired to last admission). Slow to initiate but able to answer basic questions and follow simple motor commands. Dysphonic speech with increase in volume. UE 4/5 prox to distal. LE: 4/5HF, KE and ADF/PF. Sensory exam grossly intact  Skin: Skin is warm and dry. He is not diaphoretic.  Psychiatric: His affect is blunt. His speech is delayed. He is slowed. Cognition and  memory are impaired.    Results for orders placed or performed during the hospital encounter of 04/15/16 (from the past 24 hour(s))  CBC     Status: Abnormal   Collection Time: 04/19/16  4:24 AM  Result Value Ref Range   WBC 9.9 4.0 - 10.5 K/uL   RBC 3.73 (L) 4.22 - 5.81 MIL/uL   Hemoglobin 11.3 (L) 13.0 - 17.0 g/dL   HCT 40.9 (L) 81.1 - 91.4 %   MCV 92.8 78.0 - 100.0 fL   MCH 30.3 26.0 - 34.0 pg   MCHC 32.7 30.0 - 36.0 g/dL   RDW 78.2 95.6 - 21.3 %   Platelets 318 150 - 400 K/uL  Basic metabolic panel     Status: Abnormal   Collection Time: 04/19/16  4:24 AM  Result Value Ref Range   Sodium 137  135 - 145 mmol/L   Potassium 3.5 3.5 - 5.1 mmol/L   Chloride 97 (L) 101 - 111 mmol/L   CO2 32 22 - 32 mmol/L   Glucose, Bld 111 (H) 65 - 99 mg/dL   BUN 29 (H) 6 - 20 mg/dL   Creatinine, Ser 1.61 0.61 - 1.24 mg/dL   Calcium 8.4 (L) 8.9 - 10.3 mg/dL   GFR calc non Af Amer >60 >60 mL/min   GFR calc Af Amer >60 >60 mL/min   Anion gap 8 5 - 15  Phosphorus     Status: None   Collection Time: 04/19/16  4:24 AM  Result Value Ref Range   Phosphorus 3.8 2.5 - 4.6 mg/dL  Glucose, capillary     Status: Abnormal   Collection Time: 04/19/16 12:05 PM  Result Value Ref Range   Glucose-Capillary 119 (H) 65 - 99 mg/dL   No results found.  Assessment/Plan: Diagnosis: Debility related to multiple medical/surgical issues above. Pt with recent decline after complications surrounding g-tube. He did quite well with [place] on inpatient rehab last month 1. Does the need for close, 24 hr/day medical supervision in concert with the patient's rehab needs make it unreasonable for this patient to be served in a less intensive setting? Yes 2. Co-Morbidities requiring supervision/potential complications: nutrition, skin and wound care, pain mgt, volume/CV management 3. Due to bladder management, bowel management, safety, skin/wound care, disease management, medication administration, pain management and patient  education, does the patient require 24 hr/day rehab nursing? Yes 4. Does the patient require coordinated care of a physician, rehab nurse, PT (1-2 hrs/day, 5 days/week) and OT (1-2 hrs/day, 5 days/week) to address physical and functional deficits in the context of the above medical diagnosis(es)? Yes Addressing deficits in the following areas: balance, endurance, locomotion, strength, transferring, bowel/bladder control, bathing, dressing, feeding, grooming, toileting and psychosocial support 5. Can the patient actively participate in an intensive therapy program of at least 3 hrs of therapy per day at least 5 days per week? Yes 6. The potential for patient to make measurable gains while on inpatient rehab is excellent 7. Anticipated functional outcomes upon discharge from inpatient rehab are supervision  with PT, supervision and min assist with OT, n/a with SLP. 8. Estimated rehab length of stay to reach the above functional goals is: 10-12 days 9. Does the patient have adequate social supports and living environment to accommodate these discharge functional goals? Yes 10. Anticipated D/C setting: Home 11. Anticipated post D/C treatments: HH therapy 12. Overall Rehab/Functional Prognosis: excellent  RECOMMENDATIONS: This patient's condition is appropriate for continued rehabilitative care in the following setting: CIR Patient has agreed to participate in recommended program. Yes Note that insurance prior authorization may be required for reimbursement for recommended care.  Comment: Rehab Admissions Coordinator to follow up.  Thanks,  Ranelle Oyster, MD, Earlie Counts, PA-C 04/19/2016

## 2016-04-20 ENCOUNTER — Telehealth: Payer: Self-pay | Admitting: Neurology

## 2016-04-20 LAB — GLUCOSE, CAPILLARY
GLUCOSE-CAPILLARY: 105 mg/dL — AB (ref 65–99)
GLUCOSE-CAPILLARY: 107 mg/dL — AB (ref 65–99)
GLUCOSE-CAPILLARY: 108 mg/dL — AB (ref 65–99)
GLUCOSE-CAPILLARY: 109 mg/dL — AB (ref 65–99)
GLUCOSE-CAPILLARY: 115 mg/dL — AB (ref 65–99)
Glucose-Capillary: 114 mg/dL — ABNORMAL HIGH (ref 65–99)

## 2016-04-20 LAB — BASIC METABOLIC PANEL
Anion gap: 9 (ref 5–15)
BUN: 38 mg/dL — AB (ref 6–20)
CO2: 31 mmol/L (ref 22–32)
Calcium: 8.7 mg/dL — ABNORMAL LOW (ref 8.9–10.3)
Chloride: 97 mmol/L — ABNORMAL LOW (ref 101–111)
Creatinine, Ser: 1.2 mg/dL (ref 0.61–1.24)
GFR calc Af Amer: >60 mL/min (ref >60)
GFR calc non Af Amer: 57 mL/min — ABNORMAL LOW (ref >60)
Glucose, Bld: 118 mg/dL — ABNORMAL HIGH (ref 65–99)
POTASSIUM: 3.4 mmol/L — AB (ref 3.5–5.1)
SODIUM: 137 mmol/L (ref 135–145)

## 2016-04-20 LAB — PHOSPHORUS: Phosphorus: 3.8 mg/dL (ref 2.5–4.6)

## 2016-04-20 MED ORDER — POTASSIUM CHLORIDE 20 MEQ PO PACK
40.0000 meq | PACK | Freq: Two times a day (BID) | ORAL | Status: AC
  Administered 2016-04-20 – 2016-04-21 (×2): 40 meq via ORAL
  Filled 2016-04-20 (×2): qty 2

## 2016-04-20 MED ORDER — JEVITY 1.5 CAL/FIBER PO LIQD
1000.0000 mL | ORAL | Status: DC
  Administered 2016-04-20 – 2016-04-21 (×2): 1000 mL
  Filled 2016-04-20 (×6): qty 1000

## 2016-04-20 MED ORDER — POTASSIUM CHLORIDE 20 MEQ/15ML (10%) PO SOLN
40.0000 meq | Freq: Four times a day (QID) | ORAL | Status: AC
  Administered 2016-04-20: 40 meq
  Filled 2016-04-20: qty 30

## 2016-04-20 MED ORDER — JEVITY 1.5 CAL/FIBER PO LIQD
1000.0000 mL | ORAL | Status: AC
  Administered 2016-04-20: 1000 mL
  Filled 2016-04-20 (×2): qty 1000

## 2016-04-20 NOTE — Progress Notes (Signed)
PHARMACY - ADULT TOTAL PARENTERAL NUTRITION CONSULT NOTE   Pharmacy Consult:  TPN Indication:  EC fistula  Patient Measurements: Height: 6' (182.9 cm) Weight: 168 lb 11.2 oz (76.5 kg) (scale c) IBW/kg (Calculated) : 77.6 TPN AdjBW (KG): 87.3 Body mass index is 22.88 kg/m.  Assessment:  [age] YOM presented on 04/15/16 with trouble breathing. He has a recent history of multiple procedures and extended hospitalization for recurrent partial GOO 2/2 pyloric stricture. Patient was on TF and TPN PTA for nutrition. Pharmacy consulted to continue chronic TPN while inpatient.  GI: has closed drain for abdomen and PEG tube. Prealbumin WNL at 19.2. Albumin 1.9. LBM 3/5 Endo: no hx DM - did not need SSI in the past.  Glucose controlled. Lytes: all WNL except CL at 97, K 3.4.  Free H20 for hyperNa (Na 148>137). CorCal 10.4 Renal: BPH - SCr 0.69 > 1.2, BUN 38, UOP marginal yesterday 1 ml/kg/hr, net -8L since admit. IVF KVO. Pulm: chronic resp failure on 2L [place] - guaifenesin, Duonebs Cards: CAD / HLD / HTN / dCHF (EF 50-55%) - VSS - ASA, increasing Coreg, Lasix IV>PO Hepatobil: LFTs / tbili / TG WNL Neuro: baseline dementia, DJD - fluoxetine, alprazolam, lidocaine patch ID: Zosyn / Nystatin powder - afebrile, WBC WNL. Best Practices: SCDs, [facility] TPN Access: PICC single lumen 04/15/16 TPN start date: chronic TPN  Nutritional Goals (per RD recommendation on 3/5): 2020-2260 kCal and 100-115 gm protein per day  Current Nutrition:  TPN Jevity 1.5 at 75 ml/hr x20 hrs (2250 kCal and 96 g protein) Prostat (100 kcal, 15 g protein)  [facility] TPN Formula: 1661 kCal and 78 g of protein (250 g of dextrose, 50 g of lipids) TE in TPN  Plan:  - D/C TPN  - Continue Jevity 1.5 at 75 ml/hr per MD  - TF will provide 100% of kCal and protein needs - Daily multivitamin PT - KCL 40mEq x2 PT - Pharmacy to sign off, please re-consult if needed   York CeriseKatherine Cook, PharmD Pharmacy Resident  Pager  [phone] 04/20/16 8:50 AM  Chelsea Aushuy D. Laney Potashang, PharmD, BCPS Pager:  [phone] 04/20/2016, 11:35 AM

## 2016-04-20 NOTE — Care Management Important Message (Signed)
Important Message  Patient Details  Name: Danny GalRalph T Abril MRN: [id number] Date of Birth: 05-16-1940   Medicare Important Message Given:  Yes    Dorena BodoIris Trinidee Schrag 04/20/2016, 2:02 PM

## 2016-04-20 NOTE — Progress Notes (Signed)
Pt. with 6 beat run of Vtach. Pt. asymptomatic and resting in bed. BP 119/59, HR 78. No distress noted. Pt. Denies pain/discomfort. [facility] test page. RN will continue to monitor for changes in condition. [name], [name]

## 2016-04-20 NOTE — Progress Notes (Signed)
Inpatient Rehabilitation  Met with patient and spouse to discuss team's recommendation for IP Rehab.  Shared booklets and answered questions.  Spouse reports that patient is severely debilitated.  Insurance authorization has been initiated.  Plan to update the team as I know.  Please call with questions.   Carmelia Roller., CCC/SLP Admission Coordinator  [place]  Cell [phone]

## 2016-04-20 NOTE — Progress Notes (Signed)
[facility]                                                                              Patient Demographics  [name], is a 76 y.o. male, DOB - 1940-03-06, ZOX:[id number]  Admit date - 04/15/2016   Admitting Physician Haydee Salter, MD  Outpatient Primary MD for the patient is Lillia Mountain, MD  Outpatient specialists:   LOS - 4  days    Chief Complaint  Patient presents with  . Weakness  . Shortness of Breath       Brief summary    [name] is a 76 y.o. male  past mental history significant for chronic hypoxic respiratory failure, feeding tube, high blood pressure, low back pain, Who presented to ED with complaints of shortness of breath, productive cough for the last 2 days. Patient has dementia at the baseline and history was provided by his wife.    Assessment & Plan    Principal problem Acute hypoxic respiratory failure likely secondary to aspiration and acute diastolic CHF - Chest x-ray showed bilateral pleural effusions with dependent atelectasis/lower lung pneumonia possible fluid overload/CHF - CT chest showed small to moderate bilateral pleural effusions, mild bilateral peribronchial thickening suggesting inflammation/bronchitic changes - Continue duo nebs, placed on IV Zosyn  Active problems Acute diastolic CHF -2-D echo 3/2 showed EF of 50-55%, grade 1 diastolic dysfunction with diffuse hypokinesis  - Placed on strict I's and O's, Negative balance of 8.6 L, patient was placed on aggressive IV Lasix for diuresis - Cardiology consulted, Lasix transitioned to oral form, stable from cardiac standpoint   Elevated troponins - Possibly due to demand ischemia, cardiology following, 2-D echo with EF of 55% with grade 1 diastolic dysfunction, diffuse hypokinesis but no further workup per cardiology  Dysphagia with G-tube feeding, severe malnutrition, hypoalbuminemia/failure to thrive - Albumin 1.9 - Continue Jevity per  nutrition - Was on TPN which has been stopped on 04/20/2016, discussed with dietitian - needs outpatient neurology workup  Hypernatremia Improved after free water flushes  Fungal infection  Cont Mycostatin 4 times a day  Mood disorder Prozac daily Xanax daily at bedtime  Back pain Continue Lidoderm patches  GERD Cont PPI  Code Status: full  DVT Prophylaxis:   SCD's Family Communication: Family bedside today  Disposition Plan: Inpatient rehabilitation consult placed.   Time Spent in minutes 25 minutes  Procedures:   Consultants:   Cardiology, Gen.surgey  Antimicrobials:   IV Zosyn 3/3   Medications  Scheduled Meds: . Alprazolam  0.5 mg Per Tube QHS,MR X 1  . aspirin  325 mg Oral Daily  . carvedilol  12.5 mg Oral BID WC  . chlorhexidine  15 mL Mouth Rinse BID  . cycloSPORINE  1 drop Both Eyes BID  . feeding supplement (PRO-STAT SUGAR FREE 64)  30 mL Per Tube Daily  . FLUoxetine  20 mg Per Tube Daily  . furosemide  40 mg Oral Q24H  . guaiFENesin  10 mL Oral Q8H  . ipratropium-albuterol  3 mL Nebulization TID  . lidocaine  1 patch Transdermal Q24H  . lip balm  1 application  Topical BID  . liver oil-zinc oxide   Topical BID  . mouth rinse  15 mL Mouth Rinse q12n4p  . multivitamin  15 mL Per Tube Daily  . nystatin  5 mL Per Tube QID  . omeprazole  40 mg Per Tube Daily  . piperacillin-tazobactam (ZOSYN)  IV  3.375 g Intravenous Q8H  . potassium chloride  40 mEq Oral BID  . potassium chloride  40 mEq Per Tube Q6H  . sodium chloride flush  3 mL Intravenous Q12H   Continuous Infusions: . sodium chloride    . feeding supplement (JEVITY 1.5 CAL/FIBER) 1,000 mL (04/20/16 1303)  . feeding supplement (JEVITY 1.5 CAL/FIBER)     PRN Meds:.albuterol, HYDROcodone-acetaminophen, menthol-cetylpyridinium, nitroGLYCERIN, polyvinyl alcohol, prochlorperazine, sodium chloride flush   Antibiotics   Anti-infectives    Start     Dose/Rate Route Frequency Ordered  Stop   04/16/16 1000  piperacillin-tazobactam (ZOSYN) IVPB 3.375 g     3.375 g 12.5 mL/hr over 240 Minutes Intravenous Every 8 hours 04/16/16 0922     04/15/16 1645  azithromycin (ZITHROMAX) 500 mg in dextrose 5 % 250 mL IVPB  Status:  Discontinued     500 mg 250 mL/hr over 60 Minutes Intravenous Every 24 hours 04/15/16 1542 04/16/16 0922   04/15/16 1245  cefTRIAXone (ROCEPHIN) 1 g in dextrose 5 % 50 mL IVPB     1 g 100 mL/hr over 30 Minutes Intravenous  Once 04/15/16 1243 04/15/16 1812        Subjective:   [name] was In the bed, pleasantly confused but denies any fever or chills, no headache or chest pain, denies any focal weakness.     Objective:   Vitals:   04/20/16 0231 04/20/16 0537 04/20/16 0547 04/20/16 0914  BP: (!) 119/59 116/87    Pulse: 78 80  80  Resp: 16 18  18   Temp: 97.8 F (36.6 C) 97.6 F (36.4 C)    TempSrc: Oral Oral    SpO2: 99% 96%  97%  Weight:   76.5 kg (168 lb 11.2 oz)   Height:        Intake/Output Summary (Last 24 hours) at 04/20/16 1312 Last data filed at 04/20/16 7425  Gross per 24 hour  Intake             1250 ml  Output              425 ml  Net              825 ml     Wt Readings from Last 3 Encounters:  04/20/16 76.5 kg (168 lb 11.2 oz)  03/30/16 74 kg (163 lb 2.3 oz)  02/26/16 78.6 kg (173 lb 4.5 oz)     Exam  General: Alert and oriented   HEENT:    Neck: Supple, no JVD  Cardiovascular: S1 S2 clear, RRR  Respiratory: Diminished breath sounds at the bases with rales   Gastrointestinal: Soft, nontender, nondistended, + bowel sounds, J tube  Ext: no cyanosis clubbing, no edema, PICC in left arm  Neuro: no new deficits  Skin: No rashes  Psych: Normal affect and demeanor, alert and oriented    Data Reviewed:  I have personally reviewed following labs and imaging studies  Micro Results No results found for this or any previous visit (from the past 240 hour(s)).  Radiology Reports Dg Chest 2  View  Result Date: 04/15/2016 CLINICAL DATA:  Shortness of breath over the last week. EXAM:  CHEST  2 VIEW COMPARISON:  02/28/2016 FINDINGS: Left arm PICC tip in the SVC 3 cm above the right atrium. Bilateral pleural effusions with dependent pulmonary atelectasis and/or pneumonia. Possible fluid overload/ interstitial edema. No acute bone finding. IMPRESSION: Bilateral effusions with dependent atelectasis/lower lung pneumonia. Possible fluid overload/ CHF as well. Electronically Signed   By: Paulina Fusi M.D.   On: 04/15/2016 11:24   Ct Chest Wo Contrast  Result Date: 04/15/2016 CLINICAL DATA:  Acute respiratory failure, dyspnea for a few months EXAM: CT CHEST WITHOUT CONTRAST TECHNIQUE: Multidetector CT imaging of the chest was performed following the standard protocol without IV contrast. COMPARISON:  CXR 04/15/2016 and 02/28/2016 FINDINGS: Cardiovascular: PICC line noted with tip in the distal SVC. Normal size cardiac chambers without pericardial effusion. Coronary arteriosclerosis along the LAD and circumflex. Aortic atherosclerosis. No aortic aneurysm. Mediastinum/Nodes: No supraclavicular, axillary nor mediastinal lymphadenopathy. Normal branch pattern of the great vessels with minimal atherosclerosis at the origins. No thyromegaly or nodules. The mainstem bronchi and trachea are unremarkable apart from some minimal mucous along the dependent aspect of the distal trachea and proximal left mainstem bronchus. Lungs/Pleura: Small to moderate bilateral pleural effusions left greater than right with compressive atelectasis. Trace fluid extends into both major fissures. Centrilobular emphysema. Mild peribronchial thickening consistent with inflammation/ bronchitic change. Upper Abdomen: Partially visualized GJ tube. No acute abdominal abnormality to the extent visualized. Musculoskeletal: No chest wall mass or suspicious bone lesions identified. IMPRESSION: 1. Small to moderate bilateral pleural effusions with  adjacent compressive atelectasis. 2. Mild bilateral peribronchial thickening suggesting inflammation/bronchitic change. 3. Coronary arteriosclerosis and aortic atherosclerosis. Electronically Signed   By: Tollie Eth M.D.   On: 04/15/2016 13:29   Ir Cm Inj Any Colonic Tube W/fluoro  Result Date: 04/13/2016 INDICATION: 76 year old with a GJ feeding tube. The jejunal lumen has been occluded for few days. Patient presents for tube evaluation and possible exchange. EXAM: GI TUBE INJECTION MEDICATIONS: None ANESTHESIA/SEDATION: None CONTRAST:  20 mL Omnipaque 300 - administered into the gastric lumen. FLUOROSCOPY TIME:  Fluoroscopy Time: 30 seconds, 1.4 mGy COMPLICATIONS: None immediate. PROCEDURE: Informed written consent was obtained from the patient's daughter after a thorough discussion of the procedural risks, benefits and alternatives. All questions were addressed. Maximal Sterile Barrier Technique was utilized including caps, mask, sterile gowns, sterile gloves, sterile drape, hand hygiene and skin antiseptic. A timeout was performed prior to the initiation of the procedure. Patient was placed supine on the interventional table. The jejunal lumen was clogged. The lumen was flushed with a 10 cc syringe and was successfully unclogged. Contrast injection confirmed tube placement in the small bowel. The gastric lumen was also injected and confirmed placement in the stomach. Retention balloon remains inflated within the stomach. Patient's heart rate was irregular when he presented. Patient was placed on a monitor and appeared to have frequent premature atrial contractions. This finding was discussed with the patient's PCP, Dr. [name]. Patient was asymptomatic. IMPRESSION: The GJ tube was successfully unclogged. Jejunal tube is appropriately positioned in the small bowel and ready for use. Electronically Signed   By: Richarda Overlie M.D.   On: 04/13/2016 10:26   Ir Cm Inj Any Colonic Tube W/fluoro  Result Date:  03/22/2016 INDICATION: Inadvertent retraction of gastrojejunostomy catheter. EXAM: FLUOROSCOPIC GUIDED INJECTION OF GASTROJEJUNOSTOMY TUBE COMPARISON:  Fluoroscopic guided gastrojejunostomy catheter exchange - 03/18/2016 MEDICATIONS: None. CONTRAST:  20mL ISOVUE-300 IOPAMIDOL (ISOVUE-300) INJECTION 61% - administered into the stomach and small bowel FLUOROSCOPY TIME:  36 seconds (2 mGy)  COMPLICATIONS: None. PROCEDURE: The patient was positioned supine on the fluoroscopy table. Contrast was injected via both the gastrostomy and jejunostomy lumens demonstrating appropriate position functionality of both lumens. The gastrostomy balloon is normally positioned against the ventral wall of the gastric lumen. Both lumens were flushed with a small amount of saline and capped. A dressing was placed. The patient tolerated procedure well without immediate postprocedural complication. IMPRESSION: Appropriately positioned and functioning and gastrojejunostomy catheter. No exchange performed. Electronically Signed   By: Simonne Come M.D.   On: 03/22/2016 17:26   Dg Swallowing Func-speech Pathology  Result Date: 03/25/2016 Objective Swallowing Evaluation: Type of Study: MBS-Modified Barium Swallow Study Patient Details Name: [name] MRN: [id number] Date of Birth: [birth year] Today's Date: 03/25/2016 Time: SLP Start Time (ACUTE ONLY): 1100-SLP Stop Time (ACUTE ONLY): 1130 SLP Time Calculation (min) (ACUTE ONLY): 30 min Past Medical History: Past Medical History: Diagnosis Date . Aspiration pneumonia (HCC) 01/17/2016 . Bilateral dry eyes  . Bile peritonitis due to gastric tube dislodgement 12/03/2015 . BPH (benign prostatic hyperplasia)  . Central pterygium of right eye  . Chronic respiratory failure with hypoxia (HCC)  . Coronary artery disease 1997, 2009  bare mental stent right coronart artery, occlusive followed by Dr. Garnette Scheuermann in [place] . Degenerative disc disease   LOWER BACK . Dysphonia  . ED (erectile dysfunction)   . Gastric outlet obstruction 11/24/2015 . Gastroesophageal reflux disease  . Hyperlipidemia  . Hyperopia of both eyes with astigmatism and presbyopia  . Hypertension  . Ischemic right index finger at tip 12/09/2015 . Nuclear senile cataract   Dr. [facility] in [place] . Peptic ulcer  . Pneumatosis of intestines 12/11/2015 . Pneumonia   HISTORY OF IN CHILDHOOD . Posterior vitreous detachment 10/30/2012 . Pyloric stricture 11/24/2015 . Salzmann's nodular dystrophy of left eye  . SBO (small bowel obstruction) 01/20/2016 . Urinary hesitancy  . Wears glasses  Past Surgical History: Past Surgical History: Procedure Laterality Date . CARDIAC CATHETERIZATION   . CHOLECYSTECTOMY   . ESOPHAGOGASTRODUODENOSCOPY N/A 12/01/2015  Procedure: ESOPHAGOGASTRODUODENOSCOPY (EGD) BALLOON DILATION OF DUODENAL STRICTURE;  Surgeon: Karie Soda, MD;  Location: [facility];  Service: General;  Laterality: N/A; . ESOPHAGOGASTRODUODENOSCOPY N/A 12/14/2015  Procedure: ESOPHAGOGASTRODUODENOSCOPY (EGD);  Surgeon: Vida Rigger, MD;  Location: [facility];  Service: Endoscopy;  Laterality: N/A;  Patient has tracheostomy . ESOPHAGOGASTRODUODENOSCOPY (EGD) WITH PROPOFOL N/A 07/01/2015  Procedure: ESOPHAGOGASTRODUODENOSCOPY (EGD) WITH PROPOFOL;  Surgeon: Willis Modena, MD;  Location: [facility];  Service: Endoscopy;  Laterality: N/A; . ESOPHAGOGASTRODUODENOSCOPY (EGD) WITH PROPOFOL N/A 11/23/2015  Procedure: ESOPHAGOGASTRODUODENOSCOPY (EGD) WITH PROPOFOL;  Surgeon: Willis Modena, MD;  Location: [facility] END[facility];  Service: Endoscopy;  Laterality: N/A;  may need to intubate . EYE SURGERY    growth on right eye removed  . IR GENERIC HISTORICAL  12/13/2015  IR REPLC DUODEN/JEJUNO TUBE PERCUT W/FLUORO 12/13/2015 [name], MD [facility] . IR GENERIC HISTORICAL  02/03/2016  IR REPLC GASTRO/COLONIC TUBE PERCUT W/FLUORO 02/03/2016 Berdine Dance, MD [facility] . IR GENERIC HISTORICAL  02/04/2016  IR GASTR TUBE CONVERT GASTR-JEJ PER W/FL MOD SED  02/04/2016 Malachy Moan, MD [facility] . IR GENERIC HISTORICAL  02/25/2016  IR SINUS/FIST TUBE CHK-NON GI 02/25/2016 Irish Lack, MD [facility] . IR GENERIC HISTORICAL  03/18/2016  IR CM INJ ANY COLONIC TUBE W/FLUORO 03/18/2016 Simonne Come, MD [facility] . IR GENERIC HISTORICAL  03/18/2016  IR GJ TUBE CHANGE 03/18/2016 Simonne Come, MD [facility] . IR GENERIC HISTORICAL  03/22/2016  IR CM INJ  ANY COLONIC TUBE W/FLUORO 03/22/2016 Simonne ComeJohn Watts, MD [facility] . LAPAROSCOPIC GASTROSTOMY N/A 12/01/2015  Procedure: LAPAROSCOPIC PLACEMENT OF FEEDING JEJUNOSTOMY AND GASTROSTOMY TUBE;  Surgeon: Karie SodaSteven Gross, MD;  Location: [facility];  Service: General;  Laterality: N/A; . LAPAROSCOPY N/A 12/03/2015  Procedure: LAPAROSCOPY DIAGNOSTIC, OMENTOPEXY, JEJUNOSTOMY, WASH OUT;  Surgeon: Karie SodaSteven Gross, MD;  Location: [facility];  Service: General;  Laterality: N/A; . LAPAROTOMY N/A 02/06/2016  Procedure: EXPLORATORY LAPAROTOMY, LYSIS OF ADHESIONS, SEGMENTAL SMALL BOWEL RESECTION, GASTROJEJUNOSTOMY;  Surgeon: Chevis PrettyPaul Toth III, MD;  Location: [facility];  Service: General;  Laterality: N/A; . STENT TO HEART    2 1997 AND 1 IN 1996 . TONSILLECTOMY   . XI ROBOTIC VAGOTOMY AND ANTRECTOMY N/A 09/25/2015  Procedure: XI ROBOTIC ANTERIOR AND POSTERIOR VAGOTOMY, BILROTH I  ANASTOMOSIS DOR FUNDIPLICATION, OMENTOPEXY  UPPER ENDOSCOPY;  Surgeon: Karie SodaSteven Gross, MD;  Location: [facility];  Service: General;  Laterality: N/A; HPI: [name] is a 76 y.o. male with h/o CAD and GERD who presents to the Emergency Department from [facility] by EMS complaining of nausea, vomiting, and diarrhea onset onset two days ago, worsening yesterday. Pt also has associated mild abdominal pain. Per daughter, pt has been admitted to the hospital recently for surgery in his abdomen; he has been staying at [facility] since then. Daughter reported to MD that pt has been fed through a G tube which gave him diarrhea that eventually resolved with a change in  formula.   Daughter also informed MD that pt gradually started feeding by mouth again x2 days with return of diarrhea along with nausea and vomiting. Daughter notes that the facility staff have tried to change his formula but with no relief to his symptoms.  Pt has known h/o dysphagia dysphonia, pna, peptic ulcer, GERD.  Last MBS done 12/29/15 showed moderate oropharyngeal dysphagia that looked marginally worse than test ono 12/17/15.  Pt left on ice chips only after oral care.  CXR 12/6 showed LLL consolidation ? ATX or pna.   Subjective: pt awake in chair Assessment / Plan / Recommendation CHL IP CLINICAL IMPRESSIONS 03/25/2016 Therapy Diagnosis Moderate pharyngeal phase dysphagia Clinical Impression Pt presents with moderate sensorimotor pharyngeal dysphagia resulting in silent aspiration of nectar thick liquids. Swallow function continues to be delayed and weak complicated by very weak cough mechanism. This results in delayed swallow initiaiton to the valluclua, intermittent flash penetration, one occurance of shallow frank penetration after the swallow and significant residue at the pyriform sinuses that was silently aspirated after the swallow with nectar thick liquids. Pt unable to clear pharyngeal residue with cued cough or double swallow. After 4 nectar thick boluses, pt gagged  and hocked up pharyngeal residue. Two trials of thin liquids were presented via spoon with what appeared to be timely swallow initiation. Safety with thin liquids was difficult to assess as pt began to immediately excessively wretch. Trials were discontinued as excessive wretching continued. Pt didn't produce any vomitous. These same wretching behaviors are present in ST session after consuming 2 to 3 tsp trials of thin liquids. If wretching had not taken place, swallow study could have continued with further trials of thin liquids. Pt continues to be at high risk for aspiration complicated by GI issues that are present after ~ 6 tsp  boluses of liquids. As a results, I recommend that pt remain NPO with small sips of thin H2O allowed under free water protocol as pt tolerates. Pt would also benefit from respiratory muscle  training to strengthen cough and speech intensity. Education provided to PA. Of note, pt with wretching during ST session after only 3 to 4 tsp trials. Pt appears to have a gastrointestinal component that will affect his ability to return to PO intake for nutrition.  Impact on safety and function Risk for inadequate nutrition/hydration;Moderate aspiration risk   CHL IP TREATMENT RECOMMENDATION 03/25/2016 Treatment Recommendations Therapy as outlined in treatment plan below   Prognosis 03/25/2016 Prognosis for Safe Diet Advancement Guarded Barriers to Reach Goals Cognitive deficits;Time post onset;Severity of deficits;Other (Comment) Barriers/Prognosis Comment -- CHL IP DIET RECOMMENDATION 03/25/2016 SLP Diet Recommendations Free water protocol after oral care Liquid Administration via Spoon Medication Administration Via alternative means Compensations Small sips/bites Postural Changes Remain semi-upright after after feeds/meals (Comment);Seated upright at 90 degrees   CHL IP OTHER RECOMMENDATIONS 03/25/2016 Recommended Consults Consider GI evaluation Oral Care Recommendations Oral care QID Other Recommendations Have oral suction available   CHL IP FOLLOW UP RECOMMENDATIONS 03/25/2016 Follow up Recommendations Skilled Nursing facility   [facility] IP FREQUENCY AND DURATION 01/25/2016 Speech Therapy Frequency (ACUTE ONLY) min 2x/week Treatment Duration 1 week      CHL IP ORAL PHASE 03/25/2016 Oral Phase WFL Oral - Pudding Teaspoon -- Oral - Pudding Cup -- Oral - Honey Teaspoon -- Oral - Honey Cup -- Oral - Nectar Teaspoon WFL Oral - Nectar Cup WFL Oral - Nectar Straw NT Oral - Thin Teaspoon WFL Oral - Thin Cup WFL Oral - Thin Straw NT Oral - Puree NT Oral - Mech Soft NT Oral - Regular -- Oral - Multi-Consistency -- Oral - Pill -- Oral Phase - Comment  --  CHL IP PHARYNGEAL PHASE 03/25/2016 Pharyngeal Phase Impaired Pharyngeal- Pudding Teaspoon -- Pharyngeal -- Pharyngeal- Pudding Cup -- Pharyngeal -- Pharyngeal- Honey Teaspoon -- Pharyngeal -- Pharyngeal- Honey Cup -- Pharyngeal -- Pharyngeal- Nectar Teaspoon Delayed swallow initiation-vallecula;Penetration/Apiration after swallow;Significant aspiration (Amount);Pharyngeal residue - pyriform;Pharyngeal residue - valleculae;Pharyngeal residue - posterior pharnyx;Reduced pharyngeal peristalsis Pharyngeal Material enters airway, passes BELOW cords without attempt by patient to eject out (silent aspiration) Pharyngeal- Nectar Cup Delayed swallow initiation-vallecula;Reduced pharyngeal peristalsis;Penetration/Apiration after swallow;Significant aspiration (Amount);Pharyngeal residue - pyriform;Pharyngeal residue - valleculae;Pharyngeal residue - posterior pharnyx;Compensatory strategies attempted (with notebox) Pharyngeal Material enters airway, passes BELOW cords without attempt by patient to eject out (silent aspiration) Pharyngeal- Nectar Straw NT Pharyngeal -- Pharyngeal- Thin Teaspoon Delayed swallow initiation-vallecula;Pharyngeal residue - pyriform Pharyngeal Material enters airway, remains ABOVE vocal cords then ejected out Pharyngeal- Thin Cup NT Pharyngeal -- Pharyngeal- Thin Straw NT Pharyngeal -- Pharyngeal- Puree NT Pharyngeal -- Pharyngeal- Mechanical Soft -- Pharyngeal -- Pharyngeal- Regular NT Pharyngeal -- Pharyngeal- Multi-consistency -- Pharyngeal -- Pharyngeal- Pill -- Pharyngeal -- Pharyngeal Comment --  CHL IP CERVICAL ESOPHAGEAL PHASE 01/25/2016 Cervical Esophageal Phase Impaired Pudding Teaspoon -- Pudding Cup -- Honey Teaspoon -- Honey Cup -- Nectar Teaspoon Reduced cricopharyngeal relaxation Nectar Cup Reduced cricopharyngeal relaxation Nectar Straw -- Thin Teaspoon Reduced cricopharyngeal relaxation Thin Cup Reduced cricopharyngeal relaxation Thin Straw Reduced cricopharyngeal relaxation Puree  Reduced cricopharyngeal relaxation;Esophageal backflow into cervical esophagus Mechanical Soft -- Regular -- Multi-consistency -- Pill -- Cervical Esophageal Comment -- No flowsheet data found. [name], M.S., CCC-SLP Speech-Language Pathologist Danny Overton 03/25/2016, 12:53 PM               Lab Data:  CBC:  Recent Labs Lab 04/15/16 1031 04/16/16 0441 04/17/16 0444 04/18/16 0447 04/19/16 0424  WBC 7.5 8.2 8.6 8.2 9.9  NEUTROABS 4.8  --  5.4 4.0  --  HGB 10.2* 10.2* 10.5* 10.8* 11.3*  HCT 32.4* 33.0* 33.7* 33.9* 34.6*  MCV 94.2 94.0 95.2 93.9 92.8  PLT 276 295 309 306 318   Basic Metabolic Panel:  Recent Labs Lab 04/15/16 1218  04/16/16 0441 04/17/16 0444 04/18/16 0447 04/19/16 0424 04/20/16 0639  NA  --   < > 147* 145 140 137 137  K  --   < > 3.2* 3.7 3.8 3.5 3.4*  CL  --   < > 111 107 100* 97* 97*  CO2  --   < > 30 34* 35* 32 31  GLUCOSE  --   < > 117* 109* 125* 111* 118*  BUN  --   < > 21* 21* 23* 29* 38*  CREATININE  --   < > 0.81 0.93 1.02 1.07 1.20  CALCIUM  --   < > 8.5* 8.5* 8.4* 8.4* 8.7*  MG 1.7  --   --  2.0 1.9  --   --   PHOS 3.4  --   --  4.4 5.0* 3.8  --   < > = values in this interval not displayed. GFR: Estimated Creatinine Clearance: 56.7 mL/min (by C-G formula based on SCr of 1.2 mg/dL). Liver Function Tests:  Recent Labs Lab 04/15/16 1031 04/17/16 0444 04/18/16 0447  AST 23 29 27   ALT 22 23 22   ALKPHOS 113 120 117  BILITOT 0.3 0.4 0.3  PROT 6.2* 6.5 6.8  ALBUMIN 2.0* 1.9* 1.9*   No results for input(s): LIPASE, AMYLASE in the last 168 hours. No results for input(s): AMMONIA in the last 168 hours. Coagulation Profile: No results for input(s): INR, PROTIME in the last 168 hours. Cardiac Enzymes:  Recent Labs Lab 04/15/16 1031 04/15/16 1400 04/15/16 1950 04/16/16 0157  TROPONINI 0.07* 0.06* 0.06* 0.06*   BNP (last 3 results) No results for input(s): PROBNP in the last 8760 hours. HbA1C: No results for input(s): HGBA1C  in the last 72 hours. CBG:  Recent Labs Lab 04/19/16 2026 04/19/16 2343 04/20/16 0521 04/20/16 0747 04/20/16 1254  GLUCAP 108* 115* 107* 114* 105*   Lipid Profile:  Recent Labs  04/18/16 0447  TRIG 124   Thyroid Function Tests: No results for input(s): TSH, T4TOTAL, FREET4, T3FREE, THYROIDAB in the last 72 hours. Anemia Panel: No results for input(s): VITAMINB12, FOLATE, FERRITIN, TIBC, IRON, RETICCTPCT in the last 72 hours. Urine analysis:    Component Value Date/Time   COLORURINE YELLOW 02/28/2016 1036   APPEARANCEUR CLEAR 02/28/2016 1036   LABSPEC 1.018 02/28/2016 1036   PHURINE 5.0 02/28/2016 1036   GLUCOSEU NEGATIVE 02/28/2016 1036   HGBUR NEGATIVE 02/28/2016 1036   BILIRUBINUR NEGATIVE 02/28/2016 1036   KETONESUR NEGATIVE 02/28/2016 1036   PROTEINUR NEGATIVE 02/28/2016 1036   NITRITE NEGATIVE 02/28/2016 1036   LEUKOCYTESUR NEGATIVE 02/28/2016 1036    Signature  Sharnice Bosler K M.D on 04/20/2016 at 1:12 PM  Between 7am to 7pm - Pager - [phone], After 7pm go to [email or web address] - password [facility]  [facility]  - Office  [phone]

## 2016-04-20 NOTE — Progress Notes (Signed)
Physical Therapy Treatment Patient Details Name: Ardyth GalRalph T Kampa MRN: [id number] DOB: 09-21-1940 Today's Date: 04/20/2016    History of Present Illness Pt is a 76 yo male with PMH significant for chronic hypoxic respiratory failure, NSVT, dysphagia, feeding tube, HTN, LBP, dementia, HLD, gastric outlet obstruction, aspiration pneumonia, small bowel obstruction who presented to ED on 04/15/16 for SOB. Pt recently d/c from CIR on 2/14. CXR shows bilat pleural effusions. Echo shows EF 50-55%.     PT Comments    Progressing slowly with basic mobility and gait stability.  Challenges to balance result in increased instability, gait still suffers from unsteadiness, with R knee snap as well as weak lower trunk.   Follow Up Recommendations  CIR     Equipment Recommendations  None recommended by PT    Recommendations for Other Services Rehab consult     Precautions / Restrictions Precautions Precautions: Fall Precaution Comments: NPO, gastrostomy tube; watch O2 and HR    Mobility  Bed Mobility Overal bed mobility: Needs Assistance Bed Mobility: Supine to Sit;Sit to Supine     Supine to sit: Min assist;HOB elevated Sit to supine: Mod assist;+2 for physical assistance   General bed mobility comments: pt unable to scoot forward or backward on the air overlay.  Pt able to bridge and assist [facility]  Needed assist to get legs into bed.  Transfers Overall transfer level: Needs assistance Equipment used: Rolling walker (2 wheeled) Transfers: Sit to/from [employer];Stand Pivot Transfers Sit to Stand: Mod assist Stand pivot transfers: Mod assist       General transfer comment: higher assist level reflective of pt's instability standing up on and pivoting on the fall risk matting on the floor, which challenged pt significantly  Ambulation/Gait Ambulation/Gait assistance: Min assist;+2 safety/equipment Ambulation Distance (Feet): 105 Feet Assistive device: Rolling walker (2  wheeled) Gait Pattern/deviations: Step-through pattern Gait velocity: slower Gait velocity interpretation: Below normal speed for age/gender General Gait Details: Episodes of instability with R knee snapback x2.  VC's for better use of and positioning in the RW   Stairs            Wheelchair Mobility    Modified Rankin (Stroke Patients Only)       Balance Overall balance assessment: Needs assistance Sitting-balance support: Feet supported Sitting balance-Leahy Scale: Good     Standing balance support: Bilateral upper extremity supported Standing balance-Leahy Scale: Poor Standing balance comment: reliant on RW and external support                    Cognition Arousal/Alertness: Awake/alert Behavior During Therapy: WFL for tasks assessed/performed Overall Cognitive Status: Within Functional Limits for tasks assessed         Following Commands: Follows one step commands with increased time     Problem Solving: Slow processing      Exercises General Exercises - Lower Extremity Heel Slides: AROM;Strengthening;Both;10 reps;Supine (graded resistance extension)    General Comments General comments (skin integrity, edema, etc.): pt's SpO2 on RA was 93% at 53 bpm; and during gait at 92/93% at 73 bpm.      Pertinent Vitals/Pain Pain Assessment: Faces Faces Pain Scale: Hurts a little bit Pain Location: vague Pain Descriptors / Indicators: Grimacing Pain Intervention(s): Monitored during session    Home Living                      Prior Function  PT Goals (current goals can now be found in the care plan section) Acute Rehab PT Goals Patient Stated Goal: Return home Time For Goal Achievement: 05/02/16 Potential to Achieve Goals: Fair Progress towards PT goals: Progressing toward goals    Frequency    Min 3X/week      PT Plan Current plan remains appropriate    Co-evaluation             End of Session   Activity  Tolerance: Patient tolerated treatment well Patient left: in bed;with call bell/phone within reach;with family/visitor present Nurse Communication: Mobility status PT Visit Diagnosis: Unsteadiness on feet (R26.81);Muscle weakness (generalized) (M62.81)     Time: 9604-5409 PT Time Calculation (min) (ACUTE ONLY): 34 min  Charges:  $Gait Training: 8-22 mins $Therapeutic Activity: 8-22 mins                    G CodesEliseo Gum Atiana Levier 04/20/2016, 12:23 PM 04/20/2016  [place], [place] [phone]  (pager)

## 2016-04-20 NOTE — Telephone Encounter (Signed)
Dr Kirby FunkJohn Griffin called request to Dr Anne HahnWillis to call him to discuss the pt, [phone]

## 2016-04-20 NOTE — Telephone Encounter (Signed)
Dr. Valentina LucksGriffin called concerning this patient. He has been hospitalized again. He will likely go to rehabilitation. He continues to have problems with dysphagia, he is on TPN.  He can be worked in rapidly once he is discharged, I have recommended at least checking an acetylcholine receptor antibody to exclude myasthenia gravis.  The may need EMG and nerve conduction study to evaluate for ALS if the above blood test is unremarkable.

## 2016-04-20 NOTE — Progress Notes (Signed)
Nutrition Follow-up   INTERVENTION:  Provide Jevity 1.5@75  ml/hr via J-tube for 20 hours daily with 30 ml Pro-stat once daily to provide 2350 kcal, 111 grams of protein, and 1140 ml of water.   D/C TPN  Home Health Needs: 6 cans of Jevity 1.5 daily and 30 ml Pro-stat daily. Provide Jevity 1.5@75  ml/hr for 19 hrs daily and 30 ml Pro-stat daily to provide 2233 kcal, 106 grams of protein, and 1081 ml of fluid daily.   NUTRITION DIAGNOSIS:   Inadequate oral intake related to altered GI function as evidenced by other (see comment) (multiple past abdominal surgeries requiring nutrition support to meet needs).  ongoing  GOAL:   Patient will meet greater than or equal to 90% of their needs  Being met  MONITOR:   TF tolerance, Weight trends, Labs, I & O's, Other (Comment) (TPN regimen)  REASON FOR ASSESSMENT:   Consult Enteral/tube feeding initiation and management  ASSESSMENT:   76 y.o. male  past mental history significant for chronic hypoxic respiratory failure, feeding tube, high blood pressure, low back pain, who presents emergency room with complaint shortness of breath. History mostly gathered from patient's wife as patient is demented at baseline. Per patient's wife patient has been in the hospital the last 2 months. Patient has been acutely short of breath for the last 2 days. Patient has been having a productive cough. Patient has had no fevers, chills, nausea, vomiting, diarrhea. No medications tried for patient's symptoms. Patient was brought to the emergency room for continuous complaints of shortness of breath.  Pt receiving Jevity 1.5 @ goal rate of 75 ml/hr and tolerating well. He denies any abdominal pain or discomfort. TF can be turned off for 4 hours today, beginning infusion time of 20 hours daily.   Labs: low chloride, low calcium, low potassium, elevated BUN  Diet Order:  TPN Valentino Nose 5/15) Adult with electrolyte additives  Skin:  Reviewed, no issues  Last BM:   3/6  Height:   Ht Readings from Last 1 Encounters:  04/15/16 6' (1.829 m)    Weight:   Wt Readings from Last 1 Encounters:  04/20/16 168 lb 11.2 oz (76.5 kg)    Ideal Body Weight:  80.91 kg  BMI:  Body mass index is 22.88 kg/m.  Estimated Nutritional Needs:   Kcal:  2150-2320  Protein:  100-116 grams  Fluid:  2 L/day  EDUCATION NEEDS:   No education needs identified at this time  Scarlette Ar RD, LDN, CSP Inpatient Clinical Dietitian Pager: [phone] After Hours Pager: [phone]

## 2016-04-21 ENCOUNTER — Encounter (HOSPITAL_COMMUNITY): Payer: Self-pay | Admitting: Surgery

## 2016-04-21 LAB — GLUCOSE, CAPILLARY
GLUCOSE-CAPILLARY: 101 mg/dL — AB (ref 65–99)
GLUCOSE-CAPILLARY: 93 mg/dL (ref 65–99)
GLUCOSE-CAPILLARY: 98 mg/dL (ref 65–99)
Glucose-Capillary: 93 mg/dL (ref 65–99)
Glucose-Capillary: 113 mg/dL — ABNORMAL HIGH (ref 65–99)
Glucose-Capillary: 92 mg/dL (ref 65–99)

## 2016-04-21 LAB — CBC
HEMATOCRIT: 34.5 % — AB (ref 39.0–52.0)
HEMOGLOBIN: 10.7 g/dL — AB (ref 13.0–17.0)
MCH: 29.3 pg (ref 26.0–34.0)
MCHC: 31 g/dL (ref 30.0–36.0)
MCV: 94.5 fL (ref 78.0–100.0)
Platelets: 369 10*3/uL (ref 150–400)
RBC: 3.65 MIL/uL — ABNORMAL LOW (ref 4.22–5.81)
RDW: 15.1 % (ref 11.5–15.5)
WBC: 7.3 10*3/uL (ref 4.0–10.5)

## 2016-04-21 LAB — BASIC METABOLIC PANEL
ANION GAP: 5 (ref 5–15)
BUN: 40 mg/dL — ABNORMAL HIGH (ref 6–20)
CALCIUM: 8.5 mg/dL — AB (ref 8.9–10.3)
CHLORIDE: 105 mmol/L (ref 101–111)
CO2: 30 mmol/L (ref 22–32)
Creatinine, Ser: 1.12 mg/dL (ref 0.61–1.24)
GFR calc non Af Amer: >60 mL/min (ref >60)
GLUCOSE: 92 mg/dL (ref 65–99)
Potassium: 4.7 mmol/L (ref 3.5–5.1)
Sodium: 140 mmol/L (ref 135–145)

## 2016-04-21 LAB — MAGNESIUM: Magnesium: 2.4 mg/dL (ref 1.7–2.4)

## 2016-04-21 MED ORDER — IPRATROPIUM-ALBUTEROL 0.5-2.5 (3) MG/3ML IN SOLN: 3.0000 mL | RESPIRATORY_TRACT | Status: DC | PRN

## 2016-04-21 NOTE — Progress Notes (Signed)
[facility]                                                                              Patient Demographics  [name], is a 76 y.o. male, DOB - 1940-11-15, ZOX:[id number]  Admit date - 04/15/2016   Admitting Physician Haydee Salter, MD  Outpatient Primary MD for the patient is Lillia Mountain, MD  Outpatient specialists:   LOS - 5  days    Chief Complaint  Patient presents with  . Weakness  . Shortness of Breath       Brief summary    [name] is a 76 y.o. male  past mental history significant for chronic hypoxic respiratory failure, feeding tube, high blood pressure, low back pain, Who presented to ED with complaints of shortness of breath, productive cough for the last 2 days. Patient has dementia at the baseline and history was provided by his wife.    Assessment & Plan    Principal problem Acute hypoxic respiratory failure likely secondary to aspiration and acute diastolic CHF - Chest x-ray showed bilateral pleural effusions with dependent atelectasis/lower lung pneumonia possible fluid overload/CHF - CT chest showed small to moderate bilateral pleural effusions, mild bilateral peribronchial thickening suggesting inflammation/bronchitic changes - Continue duo nebs, placed on IV Zosyn  Active problems Acute diastolic CHF -2-D echo 3/2 showed EF of 50-55%, grade 1 diastolic dysfunction with diffuse hypokinesis  - Placed on strict I's and O's, Negative balance of 8.6 L, patient was placed on aggressive IV Lasix for diuresis - Cardiology consulted, Lasix transitioned to oral form, stable from cardiac standpoint   Elevated troponins - Possibly due to demand ischemia, cardiology following, 2-D echo with EF of 55% with grade 1 diastolic dysfunction, diffuse hypokinesis but no further workup per cardiology  Dysphagia with G-tube feeding, severe malnutrition, hypoalbuminemia/failure to thrive - Albumin 1.9 - Continue Jevity per  nutrition - Was on TPN which has been stopped on 04/20/2016, discussed with dietitian - needs outpatient neurology workup, discussed with patient's primary physician Dr. Valentina Lucks this morning, myasthenia gravis workup has been initiated, he will get EMG and ALS workup per Dr. Anne Hahn outpatient.  Hypernatremia Improved after free water flushes  Fungal infection  Cont Mycostatin 4 times a day  Mood disorder Prozac daily Xanax daily at bedtime  Back pain Continue Lidoderm patches  GERD Cont PPI  Code Status: full  DVT Prophylaxis:   SCD's Family Communication: Family bedside today  Disposition Plan: SNF or CIR on 04-22-16 if stable   Time Spent in minutes 25 minutes  Procedures:   Consultants:   Cardiology, Gen.surgey  Antimicrobials:   IV Zosyn 3/3   Medications  Scheduled Meds: . Alprazolam  0.5 mg Per Tube QHS,MR X 1  . aspirin  325 mg Oral Daily  . carvedilol  12.5 mg Oral BID WC  . chlorhexidine  15 mL Mouth Rinse BID  . cycloSPORINE  1 drop Both Eyes BID  . feeding supplement (PRO-STAT SUGAR FREE 64)  30 mL Per Tube Daily  . FLUoxetine  20 mg Per Tube Daily  . furosemide  40 mg Oral Q24H  .  guaiFENesin  10 mL Oral Q8H  . lidocaine  1 patch Transdermal Q24H  . lip balm  1 application Topical BID  . liver oil-zinc oxide   Topical BID  . mouth rinse  15 mL Mouth Rinse q12n4p  . multivitamin  15 mL Per Tube Daily  . nystatin  5 mL Per Tube QID  . omeprazole  40 mg Per Tube Daily  . piperacillin-tazobactam (ZOSYN)  IV  3.375 g Intravenous Q8H  . sodium chloride flush  3 mL Intravenous Q12H   Continuous Infusions: . sodium chloride    . feeding supplement (JEVITY 1.5 CAL/FIBER) 1,000 mL (04/21/16 0131)   PRN Meds:.albuterol, HYDROcodone-acetaminophen, ipratropium-albuterol, menthol-cetylpyridinium, nitroGLYCERIN, polyvinyl alcohol, prochlorperazine, sodium chloride flush   Antibiotics   Anti-infectives    Start     Dose/Rate Route Frequency  Ordered Stop   04/16/16 1000  piperacillin-tazobactam (ZOSYN) IVPB 3.375 g     3.375 g 12.5 mL/hr over 240 Minutes Intravenous Every 8 hours 04/16/16 0922     04/15/16 1645  azithromycin (ZITHROMAX) 500 mg in dextrose 5 % 250 mL IVPB  Status:  Discontinued     500 mg 250 mL/hr over 60 Minutes Intravenous Every 24 hours 04/15/16 1542 04/16/16 0922   04/15/16 1245  cefTRIAXone (ROCEPHIN) 1 g in dextrose 5 % 50 mL IVPB     1 g 100 mL/hr over 30 Minutes Intravenous  Once 04/15/16 1243 04/15/16 1812        Subjective:   [name] was In the bed, pleasantly confused but denies any fever or chills, no headache or chest pain, denies any focal weakness.     Objective:   Vitals:   04/20/16 2004 04/21/16 0444 04/21/16 0919 04/21/16 1144  BP:  (!) 127/54    Pulse:  78  96  Resp:  18    Temp:  97.3 F (36.3 C)  97.7 F (36.5 C)  TempSrc:  Oral  Oral  SpO2: 97% 96% 98% 98%  Weight:  75.9 kg (167 lb 6.4 oz)    Height:        Intake/Output Summary (Last 24 hours) at 04/21/16 1237 Last data filed at 04/21/16 0143  Gross per 24 hour  Intake              200 ml  Output             1150 ml  Net             -950 ml     Wt Readings from Last 3 Encounters:  04/21/16 75.9 kg (167 lb 6.4 oz)  03/30/16 74 kg (163 lb 2.3 oz)  02/26/16 78.6 kg (173 lb 4.5 oz)     Exam  General: Alert and oriented   HEENT:    Neck: Supple, no JVD  Cardiovascular: S1 S2 clear, RRR  Respiratory: Diminished breath sounds at the bases with rales   Gastrointestinal: Soft, nontender, nondistended, + bowel sounds, J tube  Ext: no cyanosis clubbing, no edema, PICC in left arm  Neuro: no new deficits  Skin: No rashes  Psych: Normal affect and demeanor, alert and oriented    Data Reviewed:  I have personally reviewed following labs and imaging studies  Micro Results No results found for this or any previous visit (from the past 240 hour(s)).  Radiology Reports Dg Chest 2 View  Result  Date: 04/15/2016 CLINICAL DATA:  Shortness of breath over the last week. EXAM: CHEST  2 VIEW COMPARISON:  02/28/2016  FINDINGS: Left arm PICC tip in the SVC 3 cm above the right atrium. Bilateral pleural effusions with dependent pulmonary atelectasis and/or pneumonia. Possible fluid overload/ interstitial edema. No acute bone finding. IMPRESSION: Bilateral effusions with dependent atelectasis/lower lung pneumonia. Possible fluid overload/ CHF as well. Electronically Signed   By: Paulina Fusi M.D.   On: 04/15/2016 11:24   Ct Chest Wo Contrast  Result Date: 04/15/2016 CLINICAL DATA:  Acute respiratory failure, dyspnea for a few months EXAM: CT CHEST WITHOUT CONTRAST TECHNIQUE: Multidetector CT imaging of the chest was performed following the standard protocol without IV contrast. COMPARISON:  CXR 04/15/2016 and 02/28/2016 FINDINGS: Cardiovascular: PICC line noted with tip in the distal SVC. Normal size cardiac chambers without pericardial effusion. Coronary arteriosclerosis along the LAD and circumflex. Aortic atherosclerosis. No aortic aneurysm. Mediastinum/Nodes: No supraclavicular, axillary nor mediastinal lymphadenopathy. Normal branch pattern of the great vessels with minimal atherosclerosis at the origins. No thyromegaly or nodules. The mainstem bronchi and trachea are unremarkable apart from some minimal mucous along the dependent aspect of the distal trachea and proximal left mainstem bronchus. Lungs/Pleura: Small to moderate bilateral pleural effusions left greater than right with compressive atelectasis. Trace fluid extends into both major fissures. Centrilobular emphysema. Mild peribronchial thickening consistent with inflammation/ bronchitic change. Upper Abdomen: Partially visualized GJ tube. No acute abdominal abnormality to the extent visualized. Musculoskeletal: No chest wall mass or suspicious bone lesions identified. IMPRESSION: 1. Small to moderate bilateral pleural effusions with adjacent  compressive atelectasis. 2. Mild bilateral peribronchial thickening suggesting inflammation/bronchitic change. 3. Coronary arteriosclerosis and aortic atherosclerosis. Electronically Signed   By: Tollie Eth M.D.   On: 04/15/2016 13:29   Ir Cm Inj Any Colonic Tube W/fluoro  Result Date: 04/13/2016 INDICATION: 76 year old with a GJ feeding tube. The jejunal lumen has been occluded for few days. Patient presents for tube evaluation and possible exchange. EXAM: GI TUBE INJECTION MEDICATIONS: None ANESTHESIA/SEDATION: None CONTRAST:  20 mL Omnipaque 300 - administered into the gastric lumen. FLUOROSCOPY TIME:  Fluoroscopy Time: 30 seconds, 1.4 mGy COMPLICATIONS: None immediate. PROCEDURE: Informed written consent was obtained from the patient's daughter after a thorough discussion of the procedural risks, benefits and alternatives. All questions were addressed. Maximal Sterile Barrier Technique was utilized including caps, mask, sterile gowns, sterile gloves, sterile drape, hand hygiene and skin antiseptic. A timeout was performed prior to the initiation of the procedure. Patient was placed supine on the interventional table. The jejunal lumen was clogged. The lumen was flushed with a 10 cc syringe and was successfully unclogged. Contrast injection confirmed tube placement in the small bowel. The gastric lumen was also injected and confirmed placement in the stomach. Retention balloon remains inflated within the stomach. Patient's heart rate was irregular when he presented. Patient was placed on a monitor and appeared to have frequent premature atrial contractions. This finding was discussed with the patient's PCP, Dr. [name]. Patient was asymptomatic. IMPRESSION: The GJ tube was successfully unclogged. Jejunal tube is appropriately positioned in the small bowel and ready for use. Electronically Signed   By: Richarda Overlie M.D.   On: 04/13/2016 10:26   Ir Cm Inj Any Colonic Tube W/fluoro  Result Date:  03/22/2016 INDICATION: Inadvertent retraction of gastrojejunostomy catheter. EXAM: FLUOROSCOPIC GUIDED INJECTION OF GASTROJEJUNOSTOMY TUBE COMPARISON:  Fluoroscopic guided gastrojejunostomy catheter exchange - 03/18/2016 MEDICATIONS: None. CONTRAST:  20mL ISOVUE-300 IOPAMIDOL (ISOVUE-300) INJECTION 61% - administered into the stomach and small bowel FLUOROSCOPY TIME:  36 seconds (2 mGy) COMPLICATIONS: None. PROCEDURE: The patient was positioned  supine on the fluoroscopy table. Contrast was injected via both the gastrostomy and jejunostomy lumens demonstrating appropriate position functionality of both lumens. The gastrostomy balloon is normally positioned against the ventral wall of the gastric lumen. Both lumens were flushed with a small amount of saline and capped. A dressing was placed. The patient tolerated procedure well without immediate postprocedural complication. IMPRESSION: Appropriately positioned and functioning and gastrojejunostomy catheter. No exchange performed. Electronically Signed   By: Simonne Come M.D.   On: 03/22/2016 17:26   Dg Swallowing Func-speech Pathology  Result Date: 03/25/2016 Objective Swallowing Evaluation: Type of Study: MBS-Modified Barium Swallow Study Patient Details Name: [name] MRN: [id number] Date of Birth: [birth year] Today's Date: 03/25/2016 Time: SLP Start Time (ACUTE ONLY): 1100-SLP Stop Time (ACUTE ONLY): 1130 SLP Time Calculation (min) (ACUTE ONLY): 30 min Past Medical History: Past Medical History: Diagnosis Date . Aspiration pneumonia (HCC) 01/17/2016 . Bilateral dry eyes  . Bile peritonitis due to gastric tube dislodgement 12/03/2015 . BPH (benign prostatic hyperplasia)  . Central pterygium of right eye  . Chronic respiratory failure with hypoxia (HCC)  . Coronary artery disease 1997, 2009  bare mental stent right coronart artery, occlusive followed by Dr. Garnette Scheuermann in [place] . Degenerative disc disease   LOWER BACK . Dysphonia  . ED (erectile dysfunction)   . Gastric outlet obstruction 11/24/2015 . Gastroesophageal reflux disease  . Hyperlipidemia  . Hyperopia of both eyes with astigmatism and presbyopia  . Hypertension  . Ischemic right index finger at tip 12/09/2015 . Nuclear senile cataract   Dr. [facility] in [place] . Peptic ulcer  . Pneumatosis of intestines 12/11/2015 . Pneumonia   HISTORY OF IN CHILDHOOD . Posterior vitreous detachment 10/30/2012 . Pyloric stricture 11/24/2015 . Salzmann's nodular dystrophy of left eye  . SBO (small bowel obstruction) 01/20/2016 . Urinary hesitancy  . Wears glasses  Past Surgical History: Past Surgical History: Procedure Laterality Date . CARDIAC CATHETERIZATION   . CHOLECYSTECTOMY   . ESOPHAGOGASTRODUODENOSCOPY N/A 12/01/2015  Procedure: ESOPHAGOGASTRODUODENOSCOPY (EGD) BALLOON DILATION OF DUODENAL STRICTURE;  Surgeon: Karie Soda, MD;  Location: [facility];  Service: General;  Laterality: N/A; . ESOPHAGOGASTRODUODENOSCOPY N/A 12/14/2015  Procedure: ESOPHAGOGASTRODUODENOSCOPY (EGD);  Surgeon: Vida Rigger, MD;  Location: [facility];  Service: Endoscopy;  Laterality: N/A;  Patient has tracheostomy . ESOPHAGOGASTRODUODENOSCOPY (EGD) WITH PROPOFOL N/A 07/01/2015  Procedure: ESOPHAGOGASTRODUODENOSCOPY (EGD) WITH PROPOFOL;  Surgeon: Willis Modena, MD;  Location: [facility];  Service: Endoscopy;  Laterality: N/A; . ESOPHAGOGASTRODUODENOSCOPY (EGD) WITH PROPOFOL N/A 11/23/2015  Procedure: ESOPHAGOGASTRODUODENOSCOPY (EGD) WITH PROPOFOL;  Surgeon: Willis Modena, MD;  Location: [facility];  Service: Endoscopy;  Laterality: N/A;  may need to intubate . EYE SURGERY    growth on right eye removed  . IR GENERIC HISTORICAL  12/13/2015  IR REPLC DUODEN/JEJUNO TUBE PERCUT W/FLUORO 12/13/2015 [name], MD [facility] . IR GENERIC HISTORICAL  02/03/2016  IR REPLC GASTRO/COLONIC TUBE PERCUT W/FLUORO 02/03/2016 Berdine Dance, MD [facility] . IR GENERIC HISTORICAL  02/04/2016  IR GASTR TUBE CONVERT GASTR-JEJ PER W/FL MOD SED  02/04/2016 Malachy Moan, MD [facility] . IR GENERIC HISTORICAL  02/25/2016  IR SINUS/FIST TUBE CHK-NON GI 02/25/2016 Irish Lack, MD [facility] . IR GENERIC HISTORICAL  03/18/2016  IR CM INJ ANY COLONIC TUBE W/FLUORO 03/18/2016 Simonne Come, MD [facility] . IR GENERIC HISTORICAL  03/18/2016  IR GJ TUBE CHANGE 03/18/2016 Simonne Come, MD [facility] . IR GENERIC HISTORICAL  03/22/2016  IR CM INJ ANY COLONIC TUBE W/FLUORO 03/22/2016 Simonne Come,  MD [facility] . LAPAROSCOPIC GASTROSTOMY N/A 12/01/2015  Procedure: LAPAROSCOPIC PLACEMENT OF FEEDING JEJUNOSTOMY AND GASTROSTOMY TUBE;  Surgeon: Karie Soda, MD;  Location: [facility];  Service: General;  Laterality: N/A; . LAPAROSCOPY N/A 12/03/2015  Procedure: LAPAROSCOPY DIAGNOSTIC, OMENTOPEXY, JEJUNOSTOMY, WASH OUT;  Surgeon: Karie Soda, MD;  Location: [facility];  Service: General;  Laterality: N/A; . LAPAROTOMY N/A 02/06/2016  Procedure: EXPLORATORY LAPAROTOMY, LYSIS OF ADHESIONS, SEGMENTAL SMALL BOWEL RESECTION, GASTROJEJUNOSTOMY;  Surgeon: Chevis Pretty III, MD;  Location: [facility];  Service: General;  Laterality: N/A; . STENT TO HEART    2 1997 AND 1 IN 1996 . TONSILLECTOMY   . XI ROBOTIC VAGOTOMY AND ANTRECTOMY N/A 09/25/2015  Procedure: XI ROBOTIC ANTERIOR AND POSTERIOR VAGOTOMY, BILROTH I  ANASTOMOSIS DOR FUNDIPLICATION, OMENTOPEXY  UPPER ENDOSCOPY;  Surgeon: Karie Soda, MD;  Location: [facility];  Service: General;  Laterality: N/A; HPI: EVERRETT LACASSE is a 76 y.o. male with h/o CAD and GERD who presents to the Emergency Department from [facility] by EMS complaining of nausea, vomiting, and diarrhea onset onset two days ago, worsening yesterday. Pt also has associated mild abdominal pain. Per daughter, pt has been admitted to the hospital recently for surgery in his abdomen; he has been staying at [facility] since then. Daughter reported to MD that pt has been fed through a G tube which gave him diarrhea that eventually resolved with a change in  formula.   Daughter also informed MD that pt gradually started feeding by mouth again x2 days with return of diarrhea along with nausea and vomiting. Daughter notes that the facility staff have tried to change his formula but with no relief to his symptoms.  Pt has known h/o dysphagia dysphonia, pna, peptic ulcer, GERD.  Last MBS done 12/29/15 showed moderate oropharyngeal dysphagia that looked marginally worse than test ono 12/17/15.  Pt left on ice chips only after oral care.  CXR 12/6 showed LLL consolidation ? ATX or pna.   Subjective: pt awake in chair Assessment / Plan / Recommendation CHL IP CLINICAL IMPRESSIONS 03/25/2016 Therapy Diagnosis Moderate pharyngeal phase dysphagia Clinical Impression Pt presents with moderate sensorimotor pharyngeal dysphagia resulting in silent aspiration of nectar thick liquids. Swallow function continues to be delayed and weak complicated by very weak cough mechanism. This results in delayed swallow initiaiton to the valluclua, intermittent flash penetration, one occurance of shallow frank penetration after the swallow and significant residue at the pyriform sinuses that was silently aspirated after the swallow with nectar thick liquids. Pt unable to clear pharyngeal residue with cued cough or double swallow. After 4 nectar thick boluses, pt gagged  and hocked up pharyngeal residue. Two trials of thin liquids were presented via spoon with what appeared to be timely swallow initiation. Safety with thin liquids was difficult to assess as pt began to immediately excessively wretch. Trials were discontinued as excessive wretching continued. Pt didn't produce any vomitous. These same wretching behaviors are present in ST session after consuming 2 to 3 tsp trials of thin liquids. If wretching had not taken place, swallow study could have continued with further trials of thin liquids. Pt continues to be at high risk for aspiration complicated by GI issues that are present after ~ 6 tsp  boluses of liquids. As a results, I recommend that pt remain NPO with small sips of thin H2O allowed under free water protocol as pt tolerates. Pt would also benefit from respiratory muscle training to strengthen cough and speech intensity.  Education provided to PA. Of note, pt with wretching during ST session after only 3 to 4 tsp trials. Pt appears to have a gastrointestinal component that will affect his ability to return to PO intake for nutrition.  Impact on safety and function Risk for inadequate nutrition/hydration;Moderate aspiration risk   CHL IP TREATMENT RECOMMENDATION 03/25/2016 Treatment Recommendations Therapy as outlined in treatment plan below   Prognosis 03/25/2016 Prognosis for Safe Diet Advancement Guarded Barriers to Reach Goals Cognitive deficits;Time post onset;Severity of deficits;Other (Comment) Barriers/Prognosis Comment -- CHL IP DIET RECOMMENDATION 03/25/2016 SLP Diet Recommendations Free water protocol after oral care Liquid Administration via Spoon Medication Administration Via alternative means Compensations Small sips/bites Postural Changes Remain semi-upright after after feeds/meals (Comment);Seated upright at 90 degrees   CHL IP OTHER RECOMMENDATIONS 03/25/2016 Recommended Consults Consider GI evaluation Oral Care Recommendations Oral care QID Other Recommendations Have oral suction available   CHL IP FOLLOW UP RECOMMENDATIONS 03/25/2016 Follow up Recommendations Skilled Nursing facility   Circles Of Care IP FREQUENCY AND DURATION 01/25/2016 Speech Therapy Frequency (ACUTE ONLY) min 2x/week Treatment Duration 1 week      CHL IP ORAL PHASE 03/25/2016 Oral Phase WFL Oral - Pudding Teaspoon -- Oral - Pudding Cup -- Oral - Honey Teaspoon -- Oral - Honey Cup -- Oral - Nectar Teaspoon WFL Oral - Nectar Cup WFL Oral - Nectar Straw NT Oral - Thin Teaspoon WFL Oral - Thin Cup WFL Oral - Thin Straw NT Oral - Puree NT Oral - Mech Soft NT Oral - Regular -- Oral - Multi-Consistency -- Oral - Pill -- Oral Phase - Comment  --  CHL IP PHARYNGEAL PHASE 03/25/2016 Pharyngeal Phase Impaired Pharyngeal- Pudding Teaspoon -- Pharyngeal -- Pharyngeal- Pudding Cup -- Pharyngeal -- Pharyngeal- Honey Teaspoon -- Pharyngeal -- Pharyngeal- Honey Cup -- Pharyngeal -- Pharyngeal- Nectar Teaspoon Delayed swallow initiation-vallecula;Penetration/Apiration after swallow;Significant aspiration (Amount);Pharyngeal residue - pyriform;Pharyngeal residue - valleculae;Pharyngeal residue - posterior pharnyx;Reduced pharyngeal peristalsis Pharyngeal Material enters airway, passes BELOW cords without attempt by patient to eject out (silent aspiration) Pharyngeal- Nectar Cup Delayed swallow initiation-vallecula;Reduced pharyngeal peristalsis;Penetration/Apiration after swallow;Significant aspiration (Amount);Pharyngeal residue - pyriform;Pharyngeal residue - valleculae;Pharyngeal residue - posterior pharnyx;Compensatory strategies attempted (with notebox) Pharyngeal Material enters airway, passes BELOW cords without attempt by patient to eject out (silent aspiration) Pharyngeal- Nectar Straw NT Pharyngeal -- Pharyngeal- Thin Teaspoon Delayed swallow initiation-vallecula;Pharyngeal residue - pyriform Pharyngeal Material enters airway, remains ABOVE vocal cords then ejected out Pharyngeal- Thin Cup NT Pharyngeal -- Pharyngeal- Thin Straw NT Pharyngeal -- Pharyngeal- Puree NT Pharyngeal -- Pharyngeal- Mechanical Soft -- Pharyngeal -- Pharyngeal- Regular NT Pharyngeal -- Pharyngeal- Multi-consistency -- Pharyngeal -- Pharyngeal- Pill -- Pharyngeal -- Pharyngeal Comment --  CHL IP CERVICAL ESOPHAGEAL PHASE 01/25/2016 Cervical Esophageal Phase Impaired Pudding Teaspoon -- Pudding Cup -- Honey Teaspoon -- Honey Cup -- Nectar Teaspoon Reduced cricopharyngeal relaxation Nectar Cup Reduced cricopharyngeal relaxation Nectar Straw -- Thin Teaspoon Reduced cricopharyngeal relaxation Thin Cup Reduced cricopharyngeal relaxation Thin Straw Reduced cricopharyngeal relaxation Puree  Reduced cricopharyngeal relaxation;Esophageal backflow into cervical esophagus Mechanical Soft -- Regular -- Multi-consistency -- Pill -- Cervical Esophageal Comment -- No flowsheet data found. Happi B. Dreama Saa, M.S., CCC-SLP Speech-Language Pathologist Happi Overton 03/25/2016, 12:53 PM               Lab Data:  CBC:  Recent Labs Lab 04/15/16 1031 04/16/16 0441 04/17/16 0444 04/18/16 0447 04/19/16 0424 04/21/16 0403  WBC 7.5 8.2 8.6 8.2 9.9 7.3  NEUTROABS 4.8  --  5.4 4.0  --   --  HGB 10.2* 10.2* 10.5* 10.8* 11.3* 10.7*  HCT 32.4* 33.0* 33.7* 33.9* 34.6* 34.5*  MCV 94.2 94.0 95.2 93.9 92.8 94.5  PLT 276 295 309 306 318 369   Basic Metabolic Panel:  Recent Labs Lab 04/15/16 1218  04/17/16 0444 04/18/16 0447 04/19/16 0424 04/20/16 0639 04/20/16 1330 04/21/16 0403  NA  --   < > 145 140 137 137  --  140  K  --   < > 3.7 3.8 3.5 3.4*  --  4.7  CL  --   < > 107 100* 97* 97*  --  105  CO2  --   < > 34* 35* 32 31  --  30  GLUCOSE  --   < > 109* 125* 111* 118*  --  92  BUN  --   < > 21* 23* 29* 38*  --  40*  CREATININE  --   < > 0.93 1.02 1.07 1.20  --  1.12  CALCIUM  --   < > 8.5* 8.4* 8.4* 8.7*  --  8.5*  MG 1.7  --  2.0 1.9  --   --   --  2.4  PHOS 3.4  --  4.4 5.0* 3.8  --  3.8  --   < > = values in this interval not displayed. GFR: Estimated Creatinine Clearance: 60.2 mL/min (by C-G formula based on SCr of 1.12 mg/dL). Liver Function Tests:  Recent Labs Lab 04/15/16 1031 04/17/16 0444 04/18/16 0447  AST 23 29 27   ALT 22 23 22   ALKPHOS 113 120 117  BILITOT 0.3 0.4 0.3  PROT 6.2* 6.5 6.8  ALBUMIN 2.0* 1.9* 1.9*   No results for input(s): LIPASE, AMYLASE in the last 168 hours. No results for input(s): AMMONIA in the last 168 hours. Coagulation Profile: No results for input(s): INR, PROTIME in the last 168 hours. Cardiac Enzymes:  Recent Labs Lab 04/15/16 1031 04/15/16 1400 04/15/16 1950 04/16/16 0157  TROPONINI 0.07* 0.06* 0.06* 0.06*   BNP (last 3  results) No results for input(s): PROBNP in the last 8760 hours. HbA1C: No results for input(s): HGBA1C in the last 72 hours. CBG:  Recent Labs Lab 04/20/16 1943 04/21/16 0016 04/21/16 0442 04/21/16 0723 04/21/16 1142  GLUCAP 109* 101* 93 113* 92   Lipid Profile: No results for input(s): CHOL, HDL, LDLCALC, TRIG, CHOLHDL, LDLDIRECT in the last 72 hours. Thyroid Function Tests: No results for input(s): TSH, T4TOTAL, FREET4, T3FREE, THYROIDAB in the last 72 hours. Anemia Panel: No results for input(s): VITAMINB12, FOLATE, FERRITIN, TIBC, IRON, RETICCTPCT in the last 72 hours. Urine analysis:    Component Value Date/Time   COLORURINE YELLOW 02/28/2016 1036   APPEARANCEUR CLEAR 02/28/2016 1036   LABSPEC 1.018 02/28/2016 1036   PHURINE 5.0 02/28/2016 1036   GLUCOSEU NEGATIVE 02/28/2016 1036   HGBUR NEGATIVE 02/28/2016 1036   BILIRUBINUR NEGATIVE 02/28/2016 1036   KETONESUR NEGATIVE 02/28/2016 1036   PROTEINUR NEGATIVE 02/28/2016 1036   NITRITE NEGATIVE 02/28/2016 1036   LEUKOCYTESUR NEGATIVE 02/28/2016 1036    Signature  Jaxton Casale K M.D on 04/21/2016 at 12:37 PM  Between 7am to 7pm - Pager - [phone], After 7pm go to [email or web address] - password [email or web address]  [facility]  - Office  [phone]

## 2016-04-21 NOTE — Progress Notes (Addendum)
Inpatient Rehabilitation  [facility].  Spouse expressed strong desire for IP Rehab yesterday.  As a result, I have initiated an expedited appeal with up to a 72 hours for insurance to make another determination. Updated wife via voicemail this morning.  Plan to follow up with team as I know.  Please call with questions.   Charlane FerrettiMelissa Peniel Biel, M.A., CCC/SLP Admission Coordinator  [facility]  Cell [phone]

## 2016-04-21 NOTE — Evaluation (Signed)
Occupational Therapy Evaluation Patient Details Name: Danny GalRalph T [name] MRN: [id number] DOB: Mar 12, 1940 Today's Date: 04/21/2016    History of Present Illness Pt is a 76 yo male with PMH significant for chronic hypoxic respiratory failure, NSVT, dysphagia, feeding tube, HTN, LBP, dementia, HLD, gastric outlet obstruction, aspiration pneumonia, small bowel obstruction who presented to ED on 04/15/16 for SOB. Pt recently d/c from CIR on 2/14. CXR shows bilat pleural effusions. Echo shows EF 50-55%.    Clinical Impression   Pt performed bed mobility and toilet transfer with min assist. Sat EOB for grooming with set up and assisted to change soiled gown after condom cath came off. Pt progressing well.    Follow Up Recommendations  CIR;Supervision/Assistance - 24 hour    Equipment Recommendations  None recommended by OT    Recommendations for Other Services       Precautions / Restrictions Precautions Precautions: Fall Precaution Comments: NPO, gastrostomy tube; watch O2 and HR Restrictions Weight Bearing Restrictions: No      Mobility Bed Mobility Overal bed mobility: Needs Assistance Bed Mobility: Supine to Sit;Sit to Supine     Supine to sit: Min guard;HOB elevated Sit to supine: Min assist   General bed mobility comments: cues for technique, assisted minimally for LEs back into bed  Transfers Overall transfer level: Needs assistance Equipment used: Rolling walker (2 wheeled) Transfers: Sit to/from [employer] CorporationStand;Stand Pivot Transfers Sit to Stand: Min assist Stand pivot transfers: Min assist       General transfer comment: cues for hand placement and technique, steadying assist.    Balance   Sitting-balance support: Feet supported Sitting balance-Leahy Scale: Good       Standing balance-Leahy Scale: Poor Standing balance comment: reliant on RW and external support                            ADL Overall ADL's : Needs assistance/impaired Eating/Feeding:  NPO   Grooming: Oral care;Sitting;Set up           Upper Body Dressing : Moderate assistance;Sitting Upper Body Dressing Details (indicate cue type and reason): assist mostly due to lines     Toilet Transfer: Minimal assistance;Stand-pivot;BSC;RW   Toileting- Clothing Manipulation and Hygiene: Maximal assistance;Sit to/from stand Toileting - Clothing Manipulation Details (indicate cue type and reason): for pericare after BM     Functional mobility during ADLs: Minimal assistance;Cueing for safety;Rolling walker;+2 for safety/equipment General ADL Comments: Pt with loose stool, RN aware.     Vision         Perception     Praxis      Pertinent Vitals/Pain Pain Assessment: No/denies pain     Hand Dominance     Extremity/Trunk Assessment             Communication     Cognition Arousal/Alertness: Awake/alert Behavior During Therapy: Flat affect Overall Cognitive Status: Impaired/Different from baseline Area of Impairment: Memory;Following commands;Problem solving     Memory: Decreased short-term memory Following Commands: Follows one step commands with increased time     Problem Solving: Slow processing     General Comments       Exercises       Shoulder Instructions      Home Living Family/patient expects to be discharged to:: Private residence  Prior Functioning/Environment                   OT Problem List:        OT Treatment/Interventions:      OT Goals(Current goals can be found in the care plan section) Acute Rehab OT Goals Patient Stated Goal: Return home Time For Goal Achievement: 05/03/16 Potential to Achieve Goals: Good  OT Frequency: Min 2X/week   Barriers to D/C:            Co-evaluation              End of Session Equipment Utilized During Treatment: Gait belt;Rolling walker;Oxygen Nurse Communication:  (aware that condom cath fell off)  Activity  Tolerance: Patient tolerated treatment well Patient left: in bed;with call bell/phone within reach  OT Visit Diagnosis: Unsteadiness on feet (R26.81);Muscle weakness (generalized) (M62.81)                ADL either performed or assessed with clinical judgement  Time: 1136-1205 OT Time Calculation (min): 29 min Charges:  OT General Charges $OT Visit: 1 Procedure OT Treatments $Self Care/Home Management : 23-37 mins G-Codes:     Evern Bio 04/21/2016, 12:25 PM  [phone]

## 2016-04-21 NOTE — Progress Notes (Signed)
Nutrition Follow-up   INTERVENTION:  Change feeds to gastric today: Provide Jevity 1.5@75  ml/hr via G-tube for 20 hours today with 30 ml Pro-stat once daily to provide 2350 kcal, 111grams of protein, and 1140 ml of water.   Tomorrow, transition to bolus TF of Jevity 1.5 via PEG. Start with 120 ml bolus every 3 hours and add 60 ml to each bolus until pt is able to tolerate 480 ml (2 cans).   Home Health Needs: 6 cans of Jevity 1.5 daily and 30 ml Pro-stat daily. Provide 2 cans of Jevity 1.5 TID provide 2233 kcal, 106 grams of protein, and 1081 ml of fluid daily.    NUTRITION DIAGNOSIS:   Inadequate oral intake related to altered GI function as evidenced by other (see comment) (multiple past abdominal surgeries requiring nutrition support to meet needs).  ongoing  GOAL:   Patient will meet greater than or equal to 90% of their needs  Being met  MONITOR:   TF tolerance, Weight trends, Labs, I & O's, Other (Comment) (TPN regimen)  REASON FOR ASSESSMENT:   Consult Enteral/tube feeding initiation and management  ASSESSMENT:   76 y.o. male  past mental history significant for chronic hypoxic respiratory failure, feeding tube, high blood pressure, low back pain, who presents emergency room with complaint shortness of breath. History mostly gathered from patient's wife as patient is demented at baseline. Per patient's wife patient has been in the hospital the last 2 months. Patient has been acutely short of breath for the last 2 days. Patient has been having a productive cough. Patient has had no fevers, chills, nausea, vomiting, diarrhea. No medications tried for patient's symptoms. Patient was brought to the emergency room for continuous complaints of shortness of breath.  New TF consulted received stating "Switch enteral nutrition to 2 cans TID via G tube of supplemental shakes. Consider Jevity 1.5. May switch formula PRN If patient cannot tolerate, go back to tube feeds & trying to  cycle rate to more nightly". ? Readiness to start bolus feeds via g-tube without first trying continuous feeds. Attempted to contact Dr. Johney Maine to discuss, but he is unavailable today due to surgeries.  Discussed with RN plan to use G-tube today. If pt can tolerate continuous feeds via G-tube over 20 hours, will transition to bolus feeds tomorrow. Pt asleep at time of visit, no family at bedside. His weight continues to trend down. Pt receiving Jevity 1.5@75  ml/hr at time of visit.   Labs: low calcium, elevated BUN, low hemoglobin  Diet Order:     Skin:  Reviewed, no issues  Last BM:  3/8  Height:   Ht Readings from Last 1 Encounters:  04/15/16 6' (1.829 m)    Weight:   Wt Readings from Last 1 Encounters:  04/21/16 167 lb 6.4 oz (75.9 kg)    Ideal Body Weight:  80.91 kg  BMI:  Body mass index is 22.7 kg/m.  Estimated Nutritional Needs:   Kcal:  2150-2320  Protein:  100-116 grams  Fluid:  2 L/day  EDUCATION NEEDS:   No education needs identified at this time  Scarlette Ar RD, LDN, CSP Inpatient Clinical Dietitian Pager: [phone] After Hours Pager: [phone]

## 2016-04-22 DIAGNOSIS — K30 Functional dyspepsia: Secondary | ICD-10-CM

## 2016-04-22 LAB — CBC WITH DIFFERENTIAL/PLATELET
BASOS ABS: 0.1 10*3/uL (ref 0.0–0.1)
BASOS PCT: 2 %
EOS ABS: 0.3 10*3/uL (ref 0.0–0.7)
Eosinophils Relative: 4 %
HCT: 34.6 % — ABNORMAL LOW (ref 39.0–52.0)
Hemoglobin: 10.8 g/dL — ABNORMAL LOW (ref 13.0–17.0)
Lymphocytes Relative: 38 %
Lymphs Abs: 2.9 10*3/uL (ref 0.7–4.0)
MCH: 29.8 pg (ref 26.0–34.0)
MCHC: 31.2 g/dL (ref 30.0–36.0)
MCV: 95.6 fL (ref 78.0–100.0)
MONO ABS: 0.9 10*3/uL (ref 0.1–1.0)
MONOS PCT: 12 %
Neutro Abs: 3.4 10*3/uL (ref 1.7–7.7)
Neutrophils Relative %: 44 %
PLATELETS: 344 10*3/uL (ref 150–400)
RBC: 3.62 MIL/uL — ABNORMAL LOW (ref 4.22–5.81)
RDW: 15 % (ref 11.5–15.5)
WBC: 7.6 10*3/uL (ref 4.0–10.5)

## 2016-04-22 LAB — PREALBUMIN: PREALBUMIN: 28.9 mg/dL (ref 18–38)

## 2016-04-22 LAB — GLUCOSE, CAPILLARY
GLUCOSE-CAPILLARY: 92 mg/dL (ref 65–99)
GLUCOSE-CAPILLARY: 98 mg/dL (ref 65–99)
Glucose-Capillary: 93 mg/dL (ref 65–99)
Glucose-Capillary: 103 mg/dL — ABNORMAL HIGH (ref 65–99)

## 2016-04-22 LAB — MAGNESIUM: MAGNESIUM: 2.4 mg/dL (ref 1.7–2.4)

## 2016-04-22 LAB — PHOSPHORUS: PHOSPHORUS: 2.7 mg/dL (ref 2.5–4.6)

## 2016-04-22 MED ORDER — JEVITY 1.5 CAL/FIBER PO LIQD
474.0000 mL | Freq: Three times a day (TID) | ORAL | Status: DC
  Filled 2016-04-22 (×2): qty 1000

## 2016-04-22 MED ORDER — FREE WATER: 0 refills | Status: DC

## 2016-04-22 MED ORDER — IPRATROPIUM-ALBUTEROL 0.5-2.5 (3) MG/3ML IN SOLN: 3.0000 mL | RESPIRATORY_TRACT | 0 refills | Status: DC | PRN

## 2016-04-22 MED ORDER — JEVITY 1.5 CAL/FIBER PO LIQD
237.0000 mL | Freq: Every day | ORAL | Status: DC
  Administered 2016-04-22 (×2): 237 mL
  Filled 2016-04-22 (×6): qty 1000

## 2016-04-22 MED ORDER — CARVEDILOL 12.5 MG PO TABS: 12.5000 mg | ORAL_TABLET | Freq: Two times a day (BID) | ORAL | 0 refills | Status: DC

## 2016-04-22 MED ORDER — PRO-STAT SUGAR FREE PO LIQD: 30.0000 mL | Freq: Every day | ORAL | 0 refills | Status: DC

## 2016-04-22 MED ORDER — ALPRAZOLAM 1 MG/ML PO CONC: 0.5000 mg | Freq: Every evening | ORAL | Status: DC | PRN

## 2016-04-22 MED ORDER — FUROSEMIDE 40 MG PO TABS: 40.0000 mg | ORAL_TABLET | ORAL | 0 refills | Status: DC

## 2016-04-22 MED ORDER — JEVITY 1.5 CAL/FIBER PO LIQD: 474.0000 mL | Freq: Three times a day (TID) | ORAL | 0 refills | Status: DC

## 2016-04-22 MED ORDER — JEVITY 1.5 CAL/FIBER PO LIQD: ORAL | 0 refills | Status: DC

## 2016-04-22 MED ORDER — AMOXICILLIN-POT CLAVULANATE 875-125 MG PO TABS: 1.0000 | ORAL_TABLET | Freq: Two times a day (BID) | ORAL | 0 refills | Status: DC

## 2016-04-22 MED ORDER — JEVITY 1.5 CAL/FIBER PO LIQD: 237.0000 mL | Freq: Every day | ORAL | 0 refills | Status: DC

## 2016-04-22 MED ORDER — ASPIRIN 325 MG PO TABS: 325.0000 mg | ORAL_TABLET | Freq: Every day | ORAL | 0 refills | Status: DC

## 2016-04-22 MED ORDER — JEVITY 1.2 CAL PO LIQD: ORAL | 0 refills | Status: DC

## 2016-04-22 NOTE — Progress Notes (Signed)
Left upper arm single lumen PICC (46cm)  removed using aseptic technique. Tip intact at 46cm. Therapy at hospital completed. MD order to remove prior to DC. Wife and patient instructed to lay in bed for at least 30 minutes prior to discharge and to keep dressing C,D,I for at least 24 hours prior to removing. No bleeding noted. RN aware.   IV Team, Gordy LevanJennifer Yulissa Needham RN, BSN

## 2016-04-22 NOTE — Progress Notes (Signed)
Pt's wife aske to speak with Dr. Thedore MinsSingh and notified MD via text paged and called back & and explained to him that pt's wife has questions about pt. Being d/c to day.  MD asked to have case manager and SW speak to her.  Notified C M as instructed and she said she will be in to see spouse.  Amanda PeaNellie Calyb Mcquarrie, Charity fundraiserN.

## 2016-04-22 NOTE — Discharge Summary (Addendum)
[name]:[id number] DOB: 13-Feb-1941 DOA: 04/15/2016  PCP: Lillia Mountain, MD  Admit date: 04/15/2016  Discharge date: 04/22/2016  Admitted From: Home   Disposition:  Home (CIR not approved x 4 days, family refused SNF)   Recommendations for Outpatient Follow-up:   Follow up with PCP in 1-2 weeks  PCP Please obtain BMP/CBC, 2 view CXR in 1week,  (see Discharge instructions)   PCP Please follow up on the following pending results: Acetylcholine receptor antibodies   Home Health: PT, RN, aide, social work Insurance claims handler speech   Equipment/Devices: Hospital bed  Consultations: Cardiology, Gen.surgey  Discharge Condition: Guarded  CODE STATUS: Full   Diet Recommendation: NPO by mouth by mouth, all medications and diet via PEG tube   Chief Complaint  Patient presents with  . Weakness  . Shortness of Breath     Brief history of present illness from the day of admission and additional interim summary    [name] a 76 y.o.malepast mental history significant for chronic hypoxic respiratory failure, feeding tube, high blood pressure, low back pain, Who presented to ED with complaints of shortness of breath, productive cough for the last 2 days. Patient has dementia at the baseline and history was provided by his wife.                                                                 Hospital Course    Acute hypoxic respiratory failure likely secondary to aspiration and acute diastolic CHF - Chest x-ray showed bilateral pleural effusions with dependent atelectasis/lower lung pneumonia possible fluid overload/CHF - CT chest showed small to moderate bilateral pleural effusions, mild bilateral peribronchial thickening suggesting inflammation/bronchitic changes, received IV Zosyn and will complete  treatment with oral Augmentin. No signs of infiltrate or infection. Overall remains nothing by mouth due to underlying dysphagia or diet via PEG tube.     Acute on chronic diastolic CHF -2-D echo 3/2 showed EF of 50-55%, grade 1 diastolic dysfunction with diffuse hypokinesis  - Placed on strict I's and O's, Negative balance of 9 L, patient was placed on aggressive IV Lasix for diuresis - Cardiology consulted, Lasix transitioned to oral form, stable from cardiac standpoint   Elevated troponins - Possibly due to demand ischemia, cardiology saw the patient, 2-D echo with EF of 55% with grade 1 diastolic dysfunction, diffuse hypokinesis but no further workup per cardiology. Started on aspirin and beta blocker along with Lasix, monitor BMP & diuretic dose closely,  he can get dehydrated, may need addition of free water flushes.  Dysphagia with G-tube feeding, severe malnutrition, hypoalbuminemia/failure to thrive - Albumin 1.9 - Continue Jevity per nutrition - Was on TPN which has been stopped on 04/20/2016, discussed with dietitian - needs outpatient neurology workup, discussed with patient's primary physician Dr. Valentina Lucks  this morning, myasthenia gravis workup has been initiated, he will get EMG and ALS workup per Dr. Anne HahnWillis outpatient.   Fungal infection ContMycostatin 4 times a day  Mood disorder Prozac daily, Xanax daily at bedtime  Back pain Continue Lidoderm patches  GERD  Cont PPI    Discharge diagnosis     Principal Problem:   Acute on chronic diastolic heart failure (HCC) Active Problems:   Essential hypertension, benign   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 09/25/2015   Hyperlipidemia   Anxiety state   Protein-calorie malnutrition, severe   Respiratory failure, acute (HCC)   Tobacco abuse   Hypernatremia   Debility   Hypokalemia   Anxiety and depression   Gastrojejunostomy tube 02/06/2016   Elevated troponin   SOB (shortness of  breath)   NSVT (nonsustained ventricular tachycardia) (HCC)   Respiratory failure (HCC)   Delayed gastric emptying    Discharge instructions    Discharge Instructions    Discharge instructions    Complete by:  As directed    Follow with Primary MD Lillia MountainGRIFFIN,JOHN JOSEPH, MD in 3-4 days   Get CBC, CMP, 2 view Chest X ray checked  by Primary MD in 3-4 days ( we routinely change or add medications that can affect your baseline labs and fluid status, therefore we recommend that you get the mentioned basic workup next visit with your PCP, your PCP may decide not to get them or add new tests based on their clinical decision)  Activity: As tolerated with Full fall precautions use walker/cane & assistance as needed  Disposition Home    Diet:  Nothing by mouth by mouth, all medications and diet via PEG tube  For Heart failure patients - Check your Weight same time everyday, if you gain over 2 pounds, or you develop in leg swelling, experience more shortness of breath or chest pain, call your Primary MD immediately. Follow Cardiac Low Salt Diet and 1.5 lit/day fluid restriction.  On your next visit with your primary care physician please Get Medicines reviewed and adjusted.  Please request your Prim.MD to go over all Hospital Tests and Procedure/Radiological results at the follow up, please get all Hospital records sent to your Prim MD by signing hospital release before you go home.  If you experience worsening of your admission symptoms, develop shortness of breath, life threatening emergency, suicidal or homicidal thoughts you must seek medical attention immediately by calling 911 or calling your MD immediately  if symptoms less severe.  You Must read complete instructions/literature along with all the possible adverse reactions/side effects for all the Medicines you take and that have been prescribed to you. Take any new Medicines after you have completely understood and accpet all the possible  adverse reactions/side effects.   Do not drive, operate heavy machinery, perform activities at heights, swimming or participation in water activities or provide baby sitting services if your were admitted for syncope or siezures until you have seen by Primary MD or a Neurologist and advised to do so again.  Do not drive when taking Pain medications.    Do not take more than prescribed Pain, Sleep and Anxiety Medications  Special Instructions: If you have smoked or chewed Tobacco  in the last 2 yrs please stop smoking, stop any regular Alcohol  and or any Recreational drug use.  Wear Seat belts while driving.   Please note  You were cared for by a hospitalist during your hospital stay. If you have any  questions about your discharge medications or the care you received while you were in the hospital after you are discharged, you can call the unit and asked to speak with the hospitalist on call if the hospitalist that took care of you is not available. Once you are discharged, your primary care physician will handle any further medical issues. Please note that NO REFILLS for any discharge medications will be authorized once you are discharged, as it is imperative that you return to your primary care physician (or establish a relationship with a primary care physician if you do not have one) for your aftercare needs so that they can reassess your need for medications and monitor your lab values.   Increase activity slowly    Complete by:  As directed       Discharge Medications   Allergies as of 04/22/2016      Reactions   Flomax [tamsulosin Hcl] Other (See Comments)   Leg weakness   Lisinopril Cough   Lorazepam Other (See Comments)   "i don't like it"  Prefers Xanax or valium   Uroxatral [alfuzosin Hcl Er] Other (See Comments)   Leg weakness      Medication List    TAKE these medications   Alprazolam 1 MG/ML Conc Place 0.5 mLs (0.5 mg total) into feeding tube at bedtime and may  repeat dose one time if needed.   amoxicillin-clavulanate 875-125 MG tablet Commonly known as:  AUGMENTIN Place 1 tablet into feeding tube 2 (two) times daily.   aspirin 325 MG tablet Place 1 tablet (325 mg total) into feeding tube daily. Start taking on:  04/23/2016   carvedilol 12.5 MG tablet Commonly known as:  COREG Place 1 tablet (12.5 mg total) into feeding tube 2 (two) times daily with a meal.   chlorhexidine 0.12 % solution Commonly known as:  PERIDEX 15 mLs by Mouth Rinse route 2 (two) times daily.   cycloSPORINE 0.05 % ophthalmic emulsion Commonly known as:  RESTASIS Place 1 drop into both eyes 2 (two) times daily.   erythromycin ethylsuccinate 200 MG/5ML suspension Commonly known as:  EES Place 10 mLs (400 mg total) into feeding tube every 6 (six) hours.   feeding supplement (JEVITY 1.5 CAL/FIBER) Liqd Via PEG - Start with 120 ml bolus and increase by 60 ml every 2 hours until pt is able to tolerate 2 cans 1200 hr-120 ml, 1400 hr-180 ml, 1600hr-240 ml, 1800 hr-347ml, 2000 hr- 360 ml, 2200 hr- 420 ml - advance as tolerated. What changed:  how much to take  how to take this  when to take this  additional instructions   feeding supplement (JEVITY 1.5 CAL/FIBER) Liqd Place 474 mLs into feeding tube 3 (three) times daily. Use this dose once patient has tolerated the gradual advance dose well and is upto 2 cans at a time. Start taking on:  04/23/2016 What changed:  You were already taking a medication with the same name, and this prescription was added. Make sure you understand how and when to take each.   feeding supplement (PRO-STAT SUGAR FREE 64) Liqd Place 30 mLs into feeding tube daily. Start taking on:  04/23/2016   FLUoxetine 20 MG/5ML solution Commonly known as:  PROZAC GIVE INTO FEEDING TUBE ONCE DAILY.   free water Soln Flush feeding tube with 100cc water after each tube feed   furosemide 40 MG tablet Commonly known as:  LASIX Take 1 tablet (40  mg total) by mouth daily. Start taking on:  04/23/2016  ipratropium-albuterol 0.5-2.5 (3) MG/3ML Soln Commonly known as:  DUONEB Take 3 mLs by nebulization every 4 (four) hours as needed.   lidocaine 5 % Commonly known as:  LIDODERM Apply to back at 7 pm and remove at 7 am daily   lip balm ointment Apply 1 application topically 2 (two) times daily.   liver oil-zinc oxide 40 % ointment Commonly known as:  DESITIN Apply topically 2 (two) times daily.   magic mouthwash Soln Take 15 mLs by mouth 4 (four) times daily as needed for mouth pain (sore throat).   menthol-cetylpyridinium 3 MG lozenge Commonly known as:  CEPACOL Take 1 lozenge (3 mg total) by mouth as needed for sore throat.   mouth rinse Liqd solution 15 mLs by Mouth Rinse route 2 times daily at 12 noon and 4 pm.   multivitamin Liqd Place 15 mLs into feeding tube daily.   MUSCLE RUB 10-15 % Crea Apply 1 application topically 3 (three) times daily before meals.   nitroGLYCERIN 0.4 MG SL tablet Commonly known as:  NITROSTAT Place 1 tablet (0.4 mg total) under the tongue every 5 (five) minutes as needed for chest pain.   nystatin 100000 UNIT/ML suspension Commonly known as:  MYCOSTATIN Place 30 mLs into feeding tube 4 (four) times daily.   omeprazole 2 mg/mL Susp Commonly known as:  PRILOSEC Place 20 mLs (40 mg total) into feeding tube daily.   polyvinyl alcohol 1.4 % ophthalmic solution Commonly known as:  LIQUIFILM TEARS Place 1 drop into both eyes every 8 (eight) hours as needed (dry).   prochlorperazine 25 MG suppository Commonly known as:  COMPAZINE Place 1 suppository (25 mg total) rectally every 12 (twelve) hours as needed for nausea or vomiting.   sodium chloride flush 0.9 % Soln Commonly known as:  NS 10-40 mLs by Intracatheter route as needed (flush).            Durable Medical Equipment        Start     Ordered   04/18/16 1215  For home use only DME Other see comment  Once      Comments:  Patient is requesting a bed extension for his hospital bed at home   04/18/16 1215      Follow-up Information    Lillia Mountain, MD On 05/02/2016.   Specialty:  Internal Medicine Why:  At 2:30pm for hospital follow up  Contact information: [address] [place] [address] [phone]        Lesly Dukes, MD On 04/23/2016.   Specialty:  Neurology Why:  Graciella Belton will call patient with f/u information Contact information: [address] [place] [address] [phone]        [facility].   Why:  They will do your home health care at your home Contact information: [address] [place] [address] [phone]        Ardeth Sportsman., MD On 05/03/2016.   Specialty:  General Surgery Why:  AT 11:45am for hospital follow up  Contact information: [address] [place] [address] [phone]           Major procedures and Radiology Reports - PLEASE review detailed and final reports thoroughly  -         Dg Chest 2 View  Result Date: 04/15/2016 CLINICAL DATA:  Shortness of breath over the last week. EXAM: CHEST  2 VIEW COMPARISON:  02/28/2016 FINDINGS: Left arm PICC tip in  the SVC 3 cm above the right atrium. Bilateral pleural effusions with dependent pulmonary atelectasis and/or pneumonia. Possible fluid overload/ interstitial edema. No acute bone finding. IMPRESSION: Bilateral effusions with dependent atelectasis/lower lung pneumonia. Possible fluid overload/ CHF as well. Electronically Signed   By: Paulina Fusi M.D.   On: 04/15/2016 11:24   Ct Chest Wo Contrast  Result Date: 04/15/2016 CLINICAL DATA:  Acute respiratory failure, dyspnea for a few months EXAM: CT CHEST WITHOUT CONTRAST TECHNIQUE: Multidetector CT imaging of the chest was performed following the standard protocol without IV contrast. COMPARISON:  CXR 04/15/2016 and 02/28/2016 FINDINGS: Cardiovascular:  PICC line noted with tip in the distal SVC. Normal size cardiac chambers without pericardial effusion. Coronary arteriosclerosis along the LAD and circumflex. Aortic atherosclerosis. No aortic aneurysm. Mediastinum/Nodes: No supraclavicular, axillary nor mediastinal lymphadenopathy. Normal branch pattern of the great vessels with minimal atherosclerosis at the origins. No thyromegaly or nodules. The mainstem bronchi and trachea are unremarkable apart from some minimal mucous along the dependent aspect of the distal trachea and proximal left mainstem bronchus. Lungs/Pleura: Small to moderate bilateral pleural effusions left greater than right with compressive atelectasis. Trace fluid extends into both major fissures. Centrilobular emphysema. Mild peribronchial thickening consistent with inflammation/ bronchitic change. Upper Abdomen: Partially visualized GJ tube. No acute abdominal abnormality to the extent visualized. Musculoskeletal: No chest wall mass or suspicious bone lesions identified. IMPRESSION: 1. Small to moderate bilateral pleural effusions with adjacent compressive atelectasis. 2. Mild bilateral peribronchial thickening suggesting inflammation/bronchitic change. 3. Coronary arteriosclerosis and aortic atherosclerosis. Electronically Signed   By: Tollie Eth M.D.   On: 04/15/2016 13:29   Ir Cm Inj Any Colonic Tube W/fluoro  Result Date: 04/13/2016 INDICATION: 76 year old with a GJ feeding tube. The jejunal lumen has been occluded for few days. Patient presents for tube evaluation and possible exchange. EXAM: GI TUBE INJECTION MEDICATIONS: None ANESTHESIA/SEDATION: None CONTRAST:  20 mL Omnipaque 300 - administered into the gastric lumen. FLUOROSCOPY TIME:  Fluoroscopy Time: 30 seconds, 1.4 mGy COMPLICATIONS: None immediate. PROCEDURE: Informed written consent was obtained from the patient's daughter after a thorough discussion of the procedural risks, benefits and alternatives. All questions were  addressed. Maximal Sterile Barrier Technique was utilized including caps, mask, sterile gowns, sterile gloves, sterile drape, hand hygiene and skin antiseptic. A timeout was performed prior to the initiation of the procedure. Patient was placed supine on the interventional table. The jejunal lumen was clogged. The lumen was flushed with a 10 cc syringe and was successfully unclogged. Contrast injection confirmed tube placement in the small bowel. The gastric lumen was also injected and confirmed placement in the stomach. Retention balloon remains inflated within the stomach. Patient's heart rate was irregular when he presented. Patient was placed on a monitor and appeared to have frequent premature atrial contractions. This finding was discussed with the patient's PCP, Dr. [name]. Patient was asymptomatic. IMPRESSION: The GJ tube was successfully unclogged. Jejunal tube is appropriately positioned in the small bowel and ready for use. Electronically Signed   By: Richarda Overlie M.D.   On: 04/13/2016 10:26   Dg Swallowing Func-speech Pathology  Result Date: 03/25/2016 Objective Swallowing Evaluation: Type of Study: MBS-Modified Barium Swallow Study Patient Details Name: TIM CORRIHER MRN: [id number] Date of Birth: [birth year] Today's Date: 03/25/2016 Time: SLP Start Time (ACUTE ONLY): 1100-SLP Stop Time (ACUTE ONLY): 1130 SLP Time Calculation (min) (ACUTE ONLY): 30 min Past Medical History: Past Medical History: Diagnosis Date . Aspiration pneumonia (HCC) 01/17/2016 . Bilateral dry eyes  .  Bile peritonitis due to gastric tube dislodgement 12/03/2015 . BPH (benign prostatic hyperplasia)  . Central pterygium of right eye  . Chronic respiratory failure with hypoxia (HCC)  . Coronary artery disease 1997, 2009  bare mental stent right coronart artery, occlusive followed by Dr. Garnette Scheuermann in [place] . Degenerative disc disease   LOWER BACK . Dysphonia  . ED (erectile dysfunction)  . Gastric outlet obstruction  11/24/2015 . Gastroesophageal reflux disease  . Hyperlipidemia  . Hyperopia of both eyes with astigmatism and presbyopia  . Hypertension  . Ischemic right index finger at tip 12/09/2015 . Nuclear senile cataract   Dr. [facility] in [place] . Peptic ulcer  . Pneumatosis of intestines 12/11/2015 . Pneumonia   HISTORY OF IN CHILDHOOD . Posterior vitreous detachment 10/30/2012 . Pyloric stricture 11/24/2015 . Salzmann's nodular dystrophy of left eye  . SBO (small bowel obstruction) 01/20/2016 . Urinary hesitancy  . Wears glasses  Past Surgical History: Past Surgical History: Procedure Laterality Date . CARDIAC CATHETERIZATION   . CHOLECYSTECTOMY   . ESOPHAGOGASTRODUODENOSCOPY N/A 12/01/2015  Procedure: ESOPHAGOGASTRODUODENOSCOPY (EGD) BALLOON DILATION OF DUODENAL STRICTURE;  Surgeon: Karie Soda, MD;  Location: [facility];  Service: General;  Laterality: N/A; . ESOPHAGOGASTRODUODENOSCOPY N/A 12/14/2015  Procedure: ESOPHAGOGASTRODUODENOSCOPY (EGD);  Surgeon: Vida Rigger, MD;  Location: [facility];  Service: Endoscopy;  Laterality: N/A;  Patient has tracheostomy . ESOPHAGOGASTRODUODENOSCOPY (EGD) WITH PROPOFOL N/A 07/01/2015  Procedure: ESOPHAGOGASTRODUODENOSCOPY (EGD) WITH PROPOFOL;  Surgeon: Willis Modena, MD;  Location: [facility];  Service: Endoscopy;  Laterality: N/A; . ESOPHAGOGASTRODUODENOSCOPY (EGD) WITH PROPOFOL N/A 11/23/2015  Procedure: ESOPHAGOGASTRODUODENOSCOPY (EGD) WITH PROPOFOL;  Surgeon: Willis Modena, MD;  Location: [facility];  Service: Endoscopy;  Laterality: N/A;  may need to intubate . EYE SURGERY    growth on right eye removed  . IR GENERIC HISTORICAL  12/13/2015  IR REPLC DUODEN/JEJUNO TUBE PERCUT W/FLUORO 12/13/2015 [name], MD [facility] . IR GENERIC HISTORICAL  02/03/2016  IR REPLC GASTRO/COLONIC TUBE PERCUT W/FLUORO 02/03/2016 Berdine Dance, MD [facility] . IR GENERIC HISTORICAL  02/04/2016  IR GASTR TUBE CONVERT GASTR-JEJ PER W/FL MOD SED 02/04/2016 Malachy Moan,  MD [facility] . IR GENERIC HISTORICAL  02/25/2016  IR SINUS/FIST TUBE CHK-NON GI 02/25/2016 Irish Lack, MD [facility] . IR GENERIC HISTORICAL  03/18/2016  IR CM INJ ANY COLONIC TUBE W/FLUORO 03/18/2016 Simonne Come, MD [facility] . IR GENERIC HISTORICAL  03/18/2016  IR GJ TUBE CHANGE 03/18/2016 Simonne Come, MD [facility] . IR GENERIC HISTORICAL  03/22/2016  IR CM INJ ANY COLONIC TUBE W/FLUORO 03/22/2016 Simonne Come, MD [facility] . LAPAROSCOPIC GASTROSTOMY N/A 12/01/2015  Procedure: LAPAROSCOPIC PLACEMENT OF FEEDING JEJUNOSTOMY AND GASTROSTOMY TUBE;  Surgeon: Karie Soda, MD;  Location: [facility];  Service: General;  Laterality: N/A; . LAPAROSCOPY N/A 12/03/2015  Procedure: LAPAROSCOPY DIAGNOSTIC, OMENTOPEXY, JEJUNOSTOMY, WASH OUT;  Surgeon: Karie Soda, MD;  Location: [facility];  Service: General;  Laterality: N/A; . LAPAROTOMY N/A 02/06/2016  Procedure: EXPLORATORY LAPAROTOMY, LYSIS OF ADHESIONS, SEGMENTAL SMALL BOWEL RESECTION, GASTROJEJUNOSTOMY;  Surgeon: Chevis Pretty III, MD;  Location: [facility];  Service: General;  Laterality: N/A; . STENT TO HEART    2 1997 AND 1 IN 1996 . TONSILLECTOMY   . XI ROBOTIC VAGOTOMY AND ANTRECTOMY N/A 09/25/2015  Procedure: XI ROBOTIC ANTERIOR AND POSTERIOR VAGOTOMY, BILROTH I  ANASTOMOSIS DOR FUNDIPLICATION, OMENTOPEXY  UPPER ENDOSCOPY;  Surgeon: Karie Soda, MD;  Location: [facility];  Service: General;  Laterality: N/A; HPI: AMILCAR REEVER is a 76 y.o. male with  h/o CAD and GERD who presents to the Emergency Department from [facility] by EMS complaining of nausea, vomiting, and diarrhea onset onset two days ago, worsening yesterday. Pt also has associated mild abdominal pain. Per daughter, pt has been admitted to the hospital recently for surgery in his abdomen; he has been staying at [facility] since then. Daughter reported to MD that pt has been fed through a G tube which gave him diarrhea that eventually resolved with a change in formula.   Daughter also informed  MD that pt gradually started feeding by mouth again x2 days with return of diarrhea along with nausea and vomiting. Daughter notes that the facility staff have tried to change his formula but with no relief to his symptoms.  Pt has known h/o dysphagia dysphonia, pna, peptic ulcer, GERD.  Last MBS done 12/29/15 showed moderate oropharyngeal dysphagia that looked marginally worse than test ono 12/17/15.  Pt left on ice chips only after oral care.  CXR 12/6 showed LLL consolidation ? ATX or pna.   Subjective: pt awake in chair Assessment / Plan / Recommendation CHL IP CLINICAL IMPRESSIONS 03/25/2016 Therapy Diagnosis Moderate pharyngeal phase dysphagia Clinical Impression Pt presents with moderate sensorimotor pharyngeal dysphagia resulting in silent aspiration of nectar thick liquids. Swallow function continues to be delayed and weak complicated by very weak cough mechanism. This results in delayed swallow initiaiton to the valluclua, intermittent flash penetration, one occurance of shallow frank penetration after the swallow and significant residue at the pyriform sinuses that was silently aspirated after the swallow with nectar thick liquids. Pt unable to clear pharyngeal residue with cued cough or double swallow. After 4 nectar thick boluses, pt gagged  and hocked up pharyngeal residue. Two trials of thin liquids were presented via spoon with what appeared to be timely swallow initiation. Safety with thin liquids was difficult to assess as pt began to immediately excessively wretch. Trials were discontinued as excessive wretching continued. Pt didn't produce any vomitous. These same wretching behaviors are present in ST session after consuming 2 to 3 tsp trials of thin liquids. If wretching had not taken place, swallow study could have continued with further trials of thin liquids. Pt continues to be at high risk for aspiration complicated by GI issues that are present after ~ 6 tsp boluses of liquids. As a results, I  recommend that pt remain NPO with small sips of thin H2O allowed under free water protocol as pt tolerates. Pt would also benefit from respiratory muscle training to strengthen cough and speech intensity. Education provided to PA. Of note, pt with wretching during ST session after only 3 to 4 tsp trials. Pt appears to have a gastrointestinal component that will affect his ability to return to PO intake for nutrition.  Impact on safety and function Risk for inadequate nutrition/hydration;Moderate aspiration risk   CHL IP TREATMENT RECOMMENDATION 03/25/2016 Treatment Recommendations Therapy as outlined in treatment plan below   Prognosis 03/25/2016 Prognosis for Safe Diet Advancement Guarded Barriers to Reach Goals Cognitive deficits;Time post onset;Severity of deficits;Other (Comment) Barriers/Prognosis Comment -- CHL IP DIET RECOMMENDATION 03/25/2016 SLP Diet Recommendations Free water protocol after oral care Liquid Administration via Spoon Medication Administration Via alternative means Compensations Small sips/bites Postural Changes Remain semi-upright after after feeds/meals (Comment);Seated upright at 90 degrees   CHL IP OTHER RECOMMENDATIONS 03/25/2016 Recommended Consults Consider GI evaluation Oral Care Recommendations Oral care QID Other Recommendations Have oral suction available   CHL IP FOLLOW UP RECOMMENDATIONS 03/25/2016  Follow up Recommendations Skilled Nursing facility   [facility] IP FREQUENCY AND DURATION 01/25/2016 Speech Therapy Frequency (ACUTE ONLY) min 2x/week Treatment Duration 1 week      CHL IP ORAL PHASE 03/25/2016 Oral Phase WFL Oral - Pudding Teaspoon -- Oral - Pudding Cup -- Oral - Honey Teaspoon -- Oral - Honey Cup -- Oral - Nectar Teaspoon WFL Oral - Nectar Cup WFL Oral - Nectar Straw NT Oral - Thin Teaspoon WFL Oral - Thin Cup WFL Oral - Thin Straw NT Oral - Puree NT Oral - Mech Soft NT Oral - Regular -- Oral - Multi-Consistency -- Oral - Pill -- Oral Phase - Comment --  CHL IP PHARYNGEAL PHASE  03/25/2016 Pharyngeal Phase Impaired Pharyngeal- Pudding Teaspoon -- Pharyngeal -- Pharyngeal- Pudding Cup -- Pharyngeal -- Pharyngeal- Honey Teaspoon -- Pharyngeal -- Pharyngeal- Honey Cup -- Pharyngeal -- Pharyngeal- Nectar Teaspoon Delayed swallow initiation-vallecula;Penetration/Apiration after swallow;Significant aspiration (Amount);Pharyngeal residue - pyriform;Pharyngeal residue - valleculae;Pharyngeal residue - posterior pharnyx;Reduced pharyngeal peristalsis Pharyngeal Material enters airway, passes BELOW cords without attempt by patient to eject out (silent aspiration) Pharyngeal- Nectar Cup Delayed swallow initiation-vallecula;Reduced pharyngeal peristalsis;Penetration/Apiration after swallow;Significant aspiration (Amount);Pharyngeal residue - pyriform;Pharyngeal residue - valleculae;Pharyngeal residue - posterior pharnyx;Compensatory strategies attempted (with notebox) Pharyngeal Material enters airway, passes BELOW cords without attempt by patient to eject out (silent aspiration) Pharyngeal- Nectar Straw NT Pharyngeal -- Pharyngeal- Thin Teaspoon Delayed swallow initiation-vallecula;Pharyngeal residue - pyriform Pharyngeal Material enters airway, remains ABOVE vocal cords then ejected out Pharyngeal- Thin Cup NT Pharyngeal -- Pharyngeal- Thin Straw NT Pharyngeal -- Pharyngeal- Puree NT Pharyngeal -- Pharyngeal- Mechanical Soft -- Pharyngeal -- Pharyngeal- Regular NT Pharyngeal -- Pharyngeal- Multi-consistency -- Pharyngeal -- Pharyngeal- Pill -- Pharyngeal -- Pharyngeal Comment --  CHL IP CERVICAL ESOPHAGEAL PHASE 01/25/2016 Cervical Esophageal Phase Impaired Pudding Teaspoon -- Pudding Cup -- Honey Teaspoon -- Honey Cup -- Nectar Teaspoon Reduced cricopharyngeal relaxation Nectar Cup Reduced cricopharyngeal relaxation Nectar Straw -- Thin Teaspoon Reduced cricopharyngeal relaxation Thin Cup Reduced cricopharyngeal relaxation Thin Straw Reduced cricopharyngeal relaxation Puree Reduced cricopharyngeal  relaxation;Esophageal backflow into cervical esophagus Mechanical Soft -- Regular -- Multi-consistency -- Pill -- Cervical Esophageal Comment -- No flowsheet data found. Happi B. Dreama Saa M.S., CCC-SLP Speech-Language Pathologist Happi Dreama Saa 03/25/2016, 12:53 PM               Micro Results     No results found for this or any previous visit (from the past 240 hour(s)).  Today   Subjective    Roque Schill today has no headache,no chest abdominal pain,no new weakness tingling or numbness, feels much better wants to go home today.    Objective   Blood pressure 126/73, pulse 69, temperature 98.4 F (36.9 C), temperature source Oral, resp. rate 18, height 6' (1.829 m), weight 88.4 kg (194 lb 12.8 oz), SpO2 96 %.   Intake/Output Summary (Last 24 hours) at 04/22/16 1418 Last data filed at 04/22/16 1330  Gross per 24 hour  Intake              330 ml  Output              451 ml  Net             -121 ml    Exam Awake Alert, Oriented x 3, No new F.N deficits, Normal affect, does have generalized weakness and dysphagia [place].AT,PERRAL Supple Neck,No JVD, No cervical lymphadenopathy appriciated.  Symmetrical Chest wall movement, Good air movement bilaterally, CTAB RRR,No Gallops,Rubs or new Murmurs, No  Parasternal Heave +ve B.Sounds, Abd Soft, Non tender, No organomegaly appriciated, No rebound -guarding or rigidity. No Cyanosis, Clubbing or edema, No new Rash or bruise   Data Review   CBC w Diff:  Lab Results  Component Value Date   WBC 7.3 04/21/2016   HGB 10.7 (L) 04/21/2016   HCT 34.5 (L) 04/21/2016   PLT 369 04/21/2016   LYMPHOPCT 38 04/21/2016   MONOPCT 12 04/21/2016   EOSPCT 4 04/21/2016   BASOPCT 2 04/21/2016    CMP:  Lab Results  Component Value Date   NA 140 04/21/2016   NA 139 01/14/2016   K 4.7 04/21/2016   CL 105 04/21/2016   CO2 30 04/21/2016   BUN 40 (H) 04/21/2016   BUN 23 (A) 01/14/2016   CREATININE 1.12 04/21/2016   GLU 139 01/14/2016   PROT 6.8  04/18/2016   ALBUMIN 1.9 (L) 04/18/2016   BILITOT 0.3 04/18/2016   ALKPHOS 117 04/18/2016   AST 27 04/18/2016   ALT 22 04/18/2016  .   Total Time in preparing paper work, data evaluation and todays exam - 35 minutes  Leroy Sea M.D on 04/22/2016 at 2:18 PM  [facility]   Office  [phone]

## 2016-04-22 NOTE — Progress Notes (Signed)
Pt's wife watched and observed how to TF pt and says she has done it before and has idea.  Says she understands how to do tube feeds.  Return demonstration and says dietitian came in and gave instructions.  Verbalized understanding.  Amanda PeaNellie Corbyn Wildey, Charity fundraiserN.

## 2016-04-22 NOTE — Progress Notes (Signed)
Physical Therapy Treatment Patient Details Name: [name] MRN: [id number] DOB: 1940/06/13 Today's Date: 04/22/2016    History of Present Illness Pt is a 76 yo male with PMH significant for chronic hypoxic respiratory failure, NSVT, dysphagia, feeding tube, HTN, LBP, dementia, HLD, gastric outlet obstruction, aspiration pneumonia, small bowel obstruction who presented to ED on 04/15/16 for SOB. Pt recently d/c from [facility] on 2/14. CXR shows bilat pleural effusions. Echo shows EF 50-55%.     PT Comments    Pt's o2 sat 95-97% on room air with 2-3/4 dyspnea present.  Discussed home d/c plan with wife as CIR declined pt and she did not want SNF. Pt has DME and will be getting [facility] home which is a good option as pt has steps to enter home and feel he would have difficulty with them at this time.  Recommend HHPT.   Follow Up Recommendations  Home health PT     Equipment Recommendations  None recommended by PT    Recommendations for Other Services       Precautions / Restrictions Precautions Precautions: Fall Precaution Comments: NPO, gastrostomy tube; watch O2 and HR Restrictions Weight Bearing Restrictions: No    Mobility  Bed Mobility Overal bed mobility: Needs Assistance Bed Mobility: Supine to Sit     Supine to sit: Min guard;HOB elevated     General bed mobility comments: MIN/guard with increased time  Transfers Overall transfer level: Needs assistance Equipment used: Rolling walker (2 wheeled) Transfers: Sit to/from Stand Sit to Stand: Min assist         General transfer comment: heavy reliance on back of legs against bed. Cues for safe technique.  Ambulation/Gait Ambulation/Gait assistance: Min guard;+2 safety/equipment Ambulation Distance (Feet): 95 Feet Assistive device: Rolling walker (2 wheeled) Gait Pattern/deviations: Step-through pattern Gait velocity: decreased   General Gait Details: o2 sat 95-97% on room air with gait, although 2-3/4  dyspnea   Stairs            Wheelchair Mobility    Modified Rankin (Stroke Patients Only)       Balance   Sitting-balance support: Feet supported Sitting balance-Leahy Scale: Good       Standing balance-Leahy Scale: Poor Standing balance comment: reliant on RW and external support                    Cognition Arousal/Alertness: Awake/alert Behavior During Therapy: Flat affect         Memory: Decreased short-term memory Following Commands: Follows one step commands with increased time     Problem Solving: Slow processing      Exercises      General Comments General comments (skin integrity, edema, etc.): discussed home d/c plan with wife which includes [facility] home and HHPT      Pertinent Vitals/Pain Pain Assessment: No/denies pain    Home Living                      Prior Function            PT Goals (current goals can now be found in the care plan section) Acute Rehab PT Goals Patient Stated Goal: Return home PT Goal Formulation: With patient Time For Goal Achievement: 05/02/16 Potential to Achieve Goals: Good Progress towards PT goals: Progressing toward goals    Frequency    Min 3X/week      PT Plan Discharge plan needs to be updated    Co-evaluation  End of Session Equipment Utilized During Treatment: Gait belt Activity Tolerance: Patient tolerated treatment well Patient left: in chair;with call bell/phone within reach;with chair alarm set Nurse Communication: Mobility status PT Visit Diagnosis: Unsteadiness on feet (R26.81);Muscle weakness (generalized) (M62.81)     Time: [id number] PT Time Calculation (min) (ACUTE ONLY): 24 min  Charges:  $Gait Training: 8-22 mins $Therapeutic Activity: 8-22 mins                    G Codes:       Erby Sanderson LUBECK 04/22/2016, 2:44 PM

## 2016-04-22 NOTE — Progress Notes (Signed)
Notified [facility] dietitian that pt's wife is here as she had asked to let her know when she is here.  Mikiah Demond,RN

## 2016-04-22 NOTE — Discharge Instructions (Signed)
Follow with Primary MD Lillia MountainGRIFFIN,JOHN JOSEPH, MD in 3-4 days   Get CBC, CMP, 2 view Chest X ray checked  by Primary MD in 3-4 days ( we routinely change or add medications that can affect your baseline labs and fluid status, therefore we recommend that you get the mentioned basic workup next visit with your PCP, your PCP may decide not to get them or add new tests based on their clinical decision)  Activity: As tolerated with Full fall precautions use walker/cane & assistance as needed  Disposition Home    Diet:  Nothing by mouth by mouth, all medications and diet via PEG tube  For Heart failure patients - Check your Weight same time everyday, if you gain over 2 pounds, or you develop in leg swelling, experience more shortness of breath or chest pain, call your Primary MD immediately. Follow Cardiac Low Salt Diet and 1.5 lit/day fluid restriction.  On your next visit with your primary care physician please Get Medicines reviewed and adjusted.  Please request your Prim.MD to go over all Hospital Tests and Procedure/Radiological results at the follow up, please get all Hospital records sent to your Prim MD by signing hospital release before you go home.  If you experience worsening of your admission symptoms, develop shortness of breath, life threatening emergency, suicidal or homicidal thoughts you must seek medical attention immediately by calling 911 or calling your MD immediately  if symptoms less severe.  You Must read complete instructions/literature along with all the possible adverse reactions/side effects for all the Medicines you take and that have been prescribed to you. Take any new Medicines after you have completely understood and accpet all the possible adverse reactions/side effects.   Do not drive, operate heavy machinery, perform activities at heights, swimming or participation in water activities or provide baby sitting services if your were admitted for syncope or siezures until  you have seen by Primary MD or a Neurologist and advised to do so again.  Do not drive when taking Pain medications.    Do not take more than prescribed Pain, Sleep and Anxiety Medications  Special Instructions: If you have smoked or chewed Tobacco  in the last 2 yrs please stop smoking, stop any regular Alcohol  and or any Recreational drug use.  Wear Seat belts while driving.   Please note  You were cared for by a hospitalist during your hospital stay. If you have any questions about your discharge medications or the care you received while you were in the hospital after you are discharged, you can call the unit and asked to speak with the hospitalist on call if the hospitalist that took care of you is not available. Once you are discharged, your primary care physician will handle any further medical issues. Please note that NO REFILLS for any discharge medications will be authorized once you are discharged, as it is imperative that you return to your primary care physician (or establish a relationship with a primary care physician if you do not have one) for your aftercare needs so that they can reassess your need for medications and monitor your lab values.   Gastrostomy Tube Home Guide, Adult A gastrostomy tube is a tube that is surgically placed into the stomach. It is also called a G-tube. G-tubes are used when a person is unable to eat and drink enough on their own to stay healthy. The tube is inserted into the stomach through a small cut (incision) in the skin. This tube  is used for:  Feeding.  Giving medication. Gastrostomy tube care  Wash your hands with soap and water.  Remove the old dressing (if any). Some styles of G-tubes may need a dressing inserted between the skin and the G-tube. Other types of G-tubes do not require a dressing. Ask your health care provider if a dressing is needed.  Check the area where the tube enters the skin (insertion site) for redness,  swelling, or pus-like (purulent) drainage. A small amount of clear or tan liquid drainage is normal. Check to make sure scar tissue (skin) is not growing around the insertion site. This could have a raised, bumpy appearance.  A cotton swab can be used to clean the skin around the tube:  When the G-tube is first put in, a normal saline solution or water can be used to clean the skin.  Mild soap and warm water can be used when the skin around the G-tube site has healed.  Roll the cotton swab around the G-tube insertion site to remove any drainage or crusting at the insertion site. Stomach residuals Feeding tube residuals are the amount of liquids that are in the stomach at any given time. Residuals may be checked before giving feedings, medications, or as instructed by your health care provider.  Ask your health care provider if there are instances when you would not start tube feedings depending on the amount or type of contents withdrawn from the stomach.  Check residuals by attaching a syringe to the G-tube and pulling back on the syringe plunger. Note the amount, and return the residual back into the stomach. Flushing the G-tube  The G-tube should be periodically flushed with clean warm water to keep it from clogging.  Flush the G-tube after feedings or medications. Draw up 30 mL of warm water in a syringe. Connect the syringe to the G-tube and slowly push the water into the tube.  Do not push feedings, medications, or flushes rapidly. Flush the G-tube gently and slowly.  Only use syringes made for G-tubes to flush medications or feedings.  Your health care provider may want the G-tube flushed more often or with more water. If this is the case, follow your health care provider's instructions. Feedings Your health care provider will determine whether feedings are given as a bolus (a certain amount given at one time and at scheduled times) or whether feedings will be given continuously on  a feeding pump.  Formulas should be given at room temperature.  If feedings are continuous, no more than 4 hours worth of feedings should be placed in the feeding bag. This helps prevent spoilage or accidental excess infusion.  Cover and place unused formula in the refrigerator.  If feedings are continuous, stop the feedings when medications or flushes are given. Be sure to restart the feedings.  Feeding bags and syringes should be replaced as instructed by your health care provider. Giving medication  In general, it is best if all medications are in a liquid form for G-tube administration. Liquid medications are less likely to clog the G-tube.  Mix the liquid medication with 30 mL (or amount recommended by your health care provider) of warm water.  Draw up the medication into the syringe.  Attach the syringe to the G-tube and slowly push the mixture into the G-tube.  After giving the medication, draw up 30 mL of warm water in the syringe and slowly flush the G-tube.  For pills or capsules, check with your health care provider  first before crushing medications. Some pills are not effective if they are crushed. Some capsules are sustained-release medications.  If appropriate, crush the pill or capsule and mix with 30 mL of warm water. Using the syringe, slowly push the medication through the tube, then flush the tube with another 30 mL of tap water. G-tube problems G-tube was pulled out.  Cause: May have been pulled out accidentally.  Solutions: Cover the opening with clean dressing and tape. Call your health care provider right away. The G-tube should be put in as soon as possible (within 4 hours) so the G-tube opening (tract) does not close. The G-tube needs to be put in at a health care setting. An X-ray needs to be done to confirm placement before the G-tube can be used again. Redness, irritation, soreness, or foul odor around the gastrostomy site.  Cause: May be caused by  leakage or infection.  Solutions: Call your health care provider right away. Large amount of leakage of fluid or mucus-like liquid present (a large amount means it soaks clothing).  Cause: Many reasons could cause the G-tube to leak.  Solutions: Call your health care provider to discuss the amount of leakage. Skin or scar tissue appears to be growing where tube enters skin.  Cause: Tissue growth may develop around the insertion site if the G-tube is moved or pulled on excessively.  Solutions: Secure tube with tape so that excess movement does not occur. Call your health care provider. G-tube is clogged.  Cause: Thick formula or medication.  Solutions: Try to slowly push warm water into the tube with a large syringe. Never try to push any object into the tube to unclog it. Do not force fluid into the G-tube. If you are unable to unclog the tube, call your health care provider right away. Tips  Head of bed (HOB) position refers to the upright position of a person's upper body.  When giving medications or a feeding bolus, keep the [facility] up as told by your health care provider. Do this during the feeding and for 1 hour after the feeding or medication administration.  If continuous feedings are being given, it is best to keep the [facility] up as told by your health care provider. When ADLs (activities of daily living) are performed and the [facility] needs to be flat, be sure to turn the feeding pump off. Restart the feeding pump when the [facility] is returned to the recommended height.  Do not pull or put tension on the tube.  To prevent fluid backflow, kink the G-tube before removing the cap or disconnecting a syringe.  Check the G-tube length every day. Measure from the insertion site to the end of the G-tube. If the length is longer than previous measurements, the tube may be coming out. Call your health care provider if you notice increasing G-tube length.  Oral care, such as brushing teeth, must be  continued.  You may need to remove excess air (vent) from the G-tube. Your health care provider will tell you if this is needed.  Always call your health care provider if you have questions or problems with the G-tube. Get help right away if:  You have severe abdominal pain, tenderness, or abdominal bloating (distension).  You have nausea or vomiting.  You are constipated or have problems moving your bowels.  The G-tube insertion site is red, swollen, has a foul smell, or has yellow or brown drainage.  You have difficulty breathing or shortness of breath.  You  have a fever.  You have a large amount of feeding tube residuals.  The G-tube is clogged and cannot be flushed. This information is not intended to replace advice given to you by your health care provider. Make sure you discuss any questions you have with your health care provider. Document Released: 04/11/2001 Document Revised: 07/09/2015 Document Reviewed: 10/08/2012 Elsevier Interactive Patient Education  2017 [employer].

## 2016-04-22 NOTE — Progress Notes (Addendum)
CM and JaKeema Assist Director in to talk to patient about Discharge home today; Spouse was informed that patient is medically stable for discharge home today ( JaKeema talked to Attending Md to verify discharge home today); all questions answered. [facility]; Lupita LeashDonna with [facility] to talk to patient today prior to going home; patient stated that she wanted to talk to her daughter again before I call for ambulance transportation home. Alexis GoodellB Elvis Boot RN,MHA,BSN [phone]  2:36 pm- Lupita Leashonna and Nida BoatmanBrad with [facility] in to talk to the spouse, all questions answered; [facility] the [facility] at his home tonight and his bed extension will be delivered to the home in the am. [facility] called ( home address verified by spouse) for 6 pm pick up as requested. Abelino DerrickB Trelyn Vanderlinde [facility] Medical CenterRN,MHA,BSN [phone]

## 2016-04-22 NOTE — Progress Notes (Signed)
Patient's wife came to nurse's station wanting to speak with case management, social work, and Careers advisersurgeon regarding patient's discharge. Patient and wife were offered inpatient rehab which was denied by insurance at this time. Skilled nursing was offered as an alternative and the wife declined and wanted to take the patient home initially. Dr. Thedore MinsSingh was called to verify that patient is ready for discharge because the wife was stating the the surgeon told her otherwise. It has been explained again to the wife in the presence of case management and the primary RN that it has been clarified that the patient is to be discharged. Patient's wife verbalizes understanding although her preference is to remain hospitalized unit rehab is approved. This is not an option at this time. RN will discharge the patient and home health has been set up by case management.

## 2016-04-22 NOTE — Progress Notes (Signed)
[name]  14-Aug-1940 [id number]  Patient Care Team: [name], MD as PCP - General (Internal Medicine) Karie Soda, MD as Consulting Physician (General Surgery) Willis Modena, MD as Consulting Physician (Gastroenterology) Coralyn Helling, MD as Consulting Physician (Pulmonary Disease)   EC fistula has remained closed.  Patient finally able to transition off of TNA parenteral nutrition.  Finally started tolerating tube feeds at goal.  I think it would simplify care to transition into bolus feeds to the gastrostomy tube.  He is had issues with complications of jejunostomy clogging requiring replacement and poor tolerance.  He has had issues of intermittent nausea & vomiting in the past.  PNA.  I worry that readmission rate is high.  Would strongly recommend that patient is tolerating bolus gastric tube feeds before transitioning to home health.  Rehabilitation refused.  SNF Nursing facility does not seem to be an option at this time.  I called & discussed with the patient's daughter.  They are hoping for a few more days to be able to arrange home care by Monday.  Do not have that set up well for this weekend.    Hopefully gradually transition to bolus feeds through gastrostomy tube over the weekend.  Nutritional services on board and seem motivated to meet these goals.  Pharmacy on board as well  Dr. Valentina Lucks wanted to have neurology see the patient again for more workup.  Family has been wanting neurology re-evaluation as well.  That was being set up until this readmission with heart failure.  The patient would benefit from ALS and / or MG workup including EMG evaluation.  This unfortunately could explain some of his mental and swallowing decline beyond the severe deconditioning of his numerous surgeries.  I agree that could be a possibility.  I will defer to Dr. Valentina Lucks to discuss with the patient and family 1st.  Discuss with neurology.  ?Discuss with rehabilitation as well.  Ardeth Sportsman, M.D., F.A.C.S. Gastrointestinal and Minimally Invasive Surgery [facility] [address] [place], [place] [address] [phone] Main / Paging    Patient Active Problem List   Diagnosis Date Noted  . Protein-calorie malnutrition, severe 12/04/2015    Priority: High  . Gastritis 12/09/2015    Priority: Medium  . 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 Delayed gastric emptying 04/22/2016  . Respiratory failure (HCC) 04/16/2016  . SOB (shortness of breath)   . NSVT (nonsustained ventricular tachycardia) (HCC)   . Elevated troponin 04/15/2016  . Acute on chronic diastolic heart failure (HCC)   . Gastric dysmotility   . Gastrojejunostomy tube 02/06/2016 03/10/2016  . Hyponatremia   . Anxiety and depression   . Sleep disturbance   . Orthostasis   . Leukocytosis   . Hyperglycemia   . FTT (failure to thrive) in adult   . Anemia of chronic disease 01/31/2016  . Stage 2 skin ulcer of sacral region 01/31/2016  . Hypomagnesemia 01/21/2016  . Hypokalemia 01/21/2016  . Pressure injury of skin 01/20/2016  . Pleural effusion   . Gastroesophageal reflux disease 01/19/2016  . Insomnia 01/19/2016  . Skin ulcer of abdominal wall, limited to breakdown of skin (HCC) 01/17/2016  . Dysphagia 01/17/2016  . Debility 01/17/2016  . 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 Vitamin B12 deficiency 01/02/2016  . BPH (benign prostatic hyperplasia)   . Posterior vitreous detachment   . Salzmann's nodular dystrophy of  left eye   . Hyperopia of both eyes with astigmatism and presbyopia   . Central pterygium of right eye   . Pressure ulcer of buttock, right, unstageable (HCC) 12/30/2015  . Hypernatremia 12/28/2015  . Tobacco abuse 12/09/2015  . Respiratory failure, acute (HCC)   . Encephalopathy acute 12/03/2015  . Anxiety state 09/28/2015  . Hyperlipidemia   . Coronary  atherosclerosis of native coronary artery 04/24/2013  . Essential hypertension, benign 04/24/2013  . Other and unspecified hyperlipidemia 04/24/2013  . Central pterygium 10/30/2012  . 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 Cataract, nuclear sclerotic senile 10/30/2012    Past Medical History:  Diagnosis Date  . 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 Aspiration pneumonia (HCC) 01/17/2016  . Bilateral dry eyes   . Bile peritonitis due to gastric tube dislodgement 12/03/2015  . BPH (benign prostatic hyperplasia)   . Central pterygium of right eye   . Chronic respiratory failure with hypoxia (HCC)   . Coronary artery disease 1997, 2009   bare mental stent right coronart artery, occlusive followed by Dr. Garnette Scheuermann in [place]  . Degenerative disc disease    LOWER BACK  . Dysphonia   . ED (erectile dysfunction)   . 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 Gastric outlet obstruction 11/24/2015  . Gastroesophageal reflux disease   . Hyperlipidemia   . Hyperopia of both eyes with astigmatism and presbyopia   . Hypertension   . Ischemic right index finger at tip 12/09/2015  . Nuclear senile cataract    [facility]. [facility] in [place]  . Peptic ulcer   . Pneumatosis of intestines 12/11/2015  . Pneumonia    HISTORY OF IN CHILDHOOD  . Posterior vitreous detachment 10/30/2012  . Pyloric stricture 11/24/2015  . Salzmann's nodular dystrophy of left eye   . SBO (small bowel obstruction) 01/20/2016  . Urinary hesitancy   . Wears glasses     Past Surgical History:  Procedure Laterality Date  . CARDIAC CATHETERIZATION    . CHOLECYSTECTOMY    . ESOPHAGOGASTRODUODENOSCOPY N/A 12/01/2015   Procedure: ESOPHAGOGASTRODUODENOSCOPY (EGD) BALLOON DILATION OF DUODENAL STRICTURE;  Surgeon: Karie Soda, MD;  [facility];  Service: General;  Laterality: N/A;  .  ESOPHAGOGASTRODUODENOSCOPY N/A 12/14/2015   Procedure: ESOPHAGOGASTRODUODENOSCOPY (EGD);  Surgeon: Vida Rigger, MD;  Location: [facility];  Service: Endoscopy;  Laterality: N/A;  Patient has tracheostomy  . ESOPHAGOGASTRODUODENOSCOPY (EGD) WITH PROPOFOL N/A 07/01/2015   Procedure: ESOPHAGOGASTRODUODENOSCOPY (EGD) WITH PROPOFOL;  Surgeon: Willis Modena, MD;  [facility];  Service: Endoscopy;  Laterality: N/A;  . ESOPHAGOGASTRODUODENOSCOPY (EGD) WITH PROPOFOL N/A 11/23/2015   Procedure: ESOPHAGOGASTRODUODENOSCOPY (EGD) WITH PROPOFOL;  Surgeon: Willis Modena, MD;  Location: [facility];  Service: Endoscopy;  Laterality: N/A;  may need to intubate  . EYE SURGERY     growth on right eye removed   . IR GENERIC HISTORICAL  12/13/2015   IR REPLC DUODEN/JEJUNO TUBE PERCUT W/FLUORO 12/13/2015 [name], MD [facility]  . IR GENERIC HISTORICAL  02/03/2016   IR REPLC GASTRO/COLONIC TUBE PERCUT W/FLUORO 02/03/2016 Berdine Dance, MD [facility]  . IR GENERIC HISTORICAL  02/04/2016   IR GASTR TUBE CONVERT GASTR-JEJ PER W/FL MOD SED 02/04/2016 Malachy Moan, MD [facility]  . IR GENERIC HISTORICAL  02/25/2016   IR SINUS/FIST TUBE CHK-NON GI 02/25/2016 Irish Lack, MD [facility]  . IR GENERIC HISTORICAL  03/18/2016   IR CM INJ ANY COLONIC TUBE  W/FLUORO 03/18/2016 Simonne Come, MD [facility]  . IR GENERIC HISTORICAL  03/18/2016   IR GJ TUBE CHANGE 03/18/2016 Simonne Come, MD [facility]  . IR GENERIC HISTORICAL  03/22/2016   IR CM INJ ANY COLONIC TUBE W/FLUORO 03/22/2016 Simonne Come, MD [facility]  . IR GENERIC HISTORICAL  04/13/2016   IR CM INJ ANY COLONIC TUBE W/FLUORO 04/13/2016 Richarda Overlie, MD [facility]  . LAPAROSCOPIC GASTROSTOMY N/A 12/01/2015   Procedure: LAPAROSCOPIC PLACEMENT OF FEEDING JEJUNOSTOMY AND GASTROSTOMY TUBE;  Surgeon: Karie Soda, MD;  [facility];  Service: General;  Laterality: N/A;  . LAPAROSCOPY N/A 12/03/2015   Procedure: LAPAROSCOPY DIAGNOSTIC, OMENTOPEXY,  JEJUNOSTOMY, WASH OUT;  Surgeon: Karie Soda, MD;  [facility];  Service: General;  Laterality: N/A;  . LAPAROTOMY N/A 02/06/2016   Procedure: EXPLORATORY LAPAROTOMY, LYSIS OF ADHESIONS, SEGMENTAL SMALL BOWEL RESECTION, GASTROJEJUNOSTOMY;  Surgeon: Chevis Pretty III, MD;  [facility];  Service: General;  Laterality: N/A;  . STENT TO HEART     2 1997 AND 1 IN 1996  . TONSILLECTOMY    . XI ROBOTIC VAGOTOMY AND ANTRECTOMY N/A 09/25/2015   Procedure: XI ROBOTIC ANTERIOR AND POSTERIOR VAGOTOMY, BILROTH I  ANASTOMOSIS DOR FUNDIPLICATION, OMENTOPEXY  UPPER ENDOSCOPY;  Surgeon: Karie Soda, MD;  [facility];  Service: General;  Laterality: N/A;    Social History   Social History  . Marital status: Married    Spouse name: N/A  . Number of children: N/A  . Years of education: N/A   Occupational History  . Not on file.   Social History Main Topics  . Smoking status: Former Smoker    Packs/day: 2.00    Years: 20.00    Types: Cigarettes    Quit date: 02/15/1995  . Smokeless tobacco: Never Used  . Alcohol use No  . Drug use: No  . Sexual activity: No   Other Topics Concern  . Not on file   Social History Narrative  . No narrative on file    Family History  Problem Relation Age of Onset  . Heart attack Mother     Current Facility-Administered Medications  Medication Dose Route Frequency Provider Last Rate Last Dose  . 0.9 %  sodium chloride infusion   Intravenous Continuous Lorre Nick, MD      . albuterol (PROVENTIL) (2.5 MG/3ML) 0.083% nebulizer solution 2.5 mg  2.5 mg Nebulization Q2H PRN Haydee Salter, MD      . Alprazolam CONC 0.5 mg  0.5 mg Per Tube QHS,MR X 1 Haydee Salter, MD   0.5 mg at 04/21/16 2142  . aspirin tablet 325 mg  325 mg Oral Daily Haydee Salter, MD   325 mg at 04/22/16 1043  . carvedilol (COREG) tablet 12.5 mg  12.5 mg Oral BID WC Rhonda G Barrett, PA-C   12.5 mg at 04/22/16 1045  . chlorhexidine (PERIDEX) 0.12 % solution 15 mL  15 mL Mouth  Rinse BID Haydee Salter, MD   15 mL at 04/22/16 1042  . cycloSPORINE (RESTASIS) 0.05 % ophthalmic emulsion 1 drop  1 drop Both Eyes BID Haydee Salter, MD   1 drop at 04/21/16 2130  . feeding supplement (JEVITY 1.5 CAL/FIBER) liquid 237 mL  237 mL Per Tube 6 X Daily Leroy Sea, MD      . Melene Muller ON 04/23/2016] feeding supplement (JEVITY 1.5 CAL/FIBER) liquid 474 mL  474 mL Per Tube TID Leroy Sea, MD      .  feeding supplement (PRO-STAT SUGAR FREE 64) liquid 30 mL  30 mL Per Tube Daily Leroy Sea, MD   30 mL at 04/22/16 1047  . FLUoxetine (PROZAC) 20 MG/5ML solution 20 mg  20 mg Per Tube Daily Haydee Salter, MD   20 mg at 04/22/16 1045  . furosemide (LASIX) tablet 40 mg  40 mg Oral Q24H Tonny Bollman, MD   40 mg at 04/22/16 1044  . guaiFENesin (ROBITUSSIN) 100 MG/5ML solution 200 mg  10 mL Oral Q8H Haydee Salter, MD   200 mg at 04/22/16 1041  . HYDROcodone-acetaminophen (HYCET) 7.5-325 mg/15 ml solution 10 mL  10 mL Per Tube Q4H PRN Michael Litter, MD   10 mL at 04/17/16 1238  . ipratropium-albuterol (DUONEB) 0.5-2.5 (3) MG/3ML nebulizer solution 3 mL  3 mL Nebulization Q4H PRN Leroy Sea, MD      . lidocaine (LIDODERM) 5 % 1 patch  1 patch Transdermal Q24H Haydee Salter, MD   1 patch at 04/21/16 2130  . lip balm (BLISTEX) ointment 1 application  1 application Topical BID Haydee Salter, MD   1 application at 04/22/16 1120  . liver oil-zinc oxide (DESITIN) 40 % ointment   Topical BID Haydee Salter, MD   1 application at 04/22/16 1000  . MEDLINE mouth rinse  15 mL Mouth Rinse q12n4p Haydee Salter, MD   15 mL at 04/21/16 1600  . menthol-cetylpyridinium (CEPACOL) lozenge 3 mg  1 lozenge Oral PRN Haydee Salter, MD      . multivitamin liquid 15 mL  15 mL Per Tube Daily Gerilyn Nestle, RPH   15 mL at 04/22/16 1042  . nitroGLYCERIN (NITROSTAT) SL tablet 0.4 mg  0.4 mg Sublingual Q5 min PRN Haydee Salter, MD      . nystatin (MYCOSTATIN) 100000 UNIT/ML suspension 500,000  Units  5 mL Per Tube QID Haydee Salter, MD   500,000 Units at 04/22/16 1043  . omeprazole (PRILOSEC) 2 mg/mL oral suspension SUSP 40 mg  40 mg Per Tube Daily Haydee Salter, MD   40 mg at 04/22/16 1101  . piperacillin-tazobactam (ZOSYN) IVPB 3.375 g  3.375 g Intravenous Q8H Ripudeep K Rai, MD   3.375 g at 04/22/16 1101  . polyvinyl alcohol (LIQUIFILM TEARS) 1.4 % ophthalmic solution 1 drop  1 drop Both Eyes Q8H PRN Haydee Salter, MD   1 drop at 04/22/16 1122  . prochlorperazine (COMPAZINE) suppository 25 mg  25 mg Rectal Q12H PRN Haydee Salter, MD      . sodium chloride flush (NS) 0.9 % injection 10-40 mL  10-40 mL Intracatheter PRN Haydee Salter, MD   10 mL at 04/22/16 0224  . sodium chloride flush (NS) 0.9 % injection 3 mL  3 mL Intravenous Q12H Haydee Salter, MD   3 mL at 04/22/16 1102     Allergies  Allergen Reactions  . Flomax [Tamsulosin Hcl] Other (See Comments)    Leg weakness  . Lisinopril Cough  . Lorazepam Other (See Comments)    "i don't like it"  Prefers Xanax or valium  . Uroxatral [Alfuzosin Hcl Er] Other (See Comments)    Leg weakness    BP 136/87   Pulse 70   Temp 97.5 F (36.4 C) (Oral)   Resp 19   Ht 6' (1.829 m)   Wt 88.4 kg (194 lb 12.8 oz) Comment: Scale C  SpO2 100%   BMI 26.42 kg/m  Dg Chest 2 View  Result Date: 04/15/2016 CLINICAL DATA:  Shortness of breath over the last week. EXAM: CHEST  2 VIEW COMPARISON:  02/28/2016 FINDINGS: Left arm PICC tip in the SVC 3 cm above the right atrium. Bilateral pleural effusions with dependent pulmonary atelectasis and/or pneumonia. Possible fluid overload/ interstitial edema. No acute bone finding. IMPRESSION: Bilateral effusions with dependent atelectasis/lower lung pneumonia. Possible fluid overload/ CHF as well. Electronically Signed   By: Paulina Fusi M.D.   On: 04/15/2016 11:24   Ct Chest Wo Contrast  Result Date: 04/15/2016 CLINICAL DATA:  Acute respiratory failure, dyspnea for a few months EXAM: CT  CHEST WITHOUT CONTRAST TECHNIQUE: Multidetector CT imaging of the chest was performed following the standard protocol without IV contrast. COMPARISON:  CXR 04/15/2016 and 02/28/2016 FINDINGS: Cardiovascular: PICC line noted with tip in the distal SVC. Normal size cardiac chambers without pericardial effusion. Coronary arteriosclerosis along the LAD and circumflex. Aortic atherosclerosis. No aortic aneurysm. Mediastinum/Nodes: No supraclavicular, axillary nor mediastinal lymphadenopathy. Normal branch pattern of the great vessels with minimal atherosclerosis at the origins. No thyromegaly or nodules. The mainstem bronchi and trachea are unremarkable apart from some minimal mucous along the dependent aspect of the distal trachea and proximal left mainstem bronchus. Lungs/Pleura: Small to moderate bilateral pleural effusions left greater than right with compressive atelectasis. Trace fluid extends into both major fissures. Centrilobular emphysema. Mild peribronchial thickening consistent with inflammation/ bronchitic change. Upper Abdomen: Partially visualized GJ tube. No acute abdominal abnormality to the extent visualized. Musculoskeletal: No chest wall mass or suspicious bone lesions identified. IMPRESSION: 1. Small to moderate bilateral pleural effusions with adjacent compressive atelectasis. 2. Mild bilateral peribronchial thickening suggesting inflammation/bronchitic change. 3. Coronary arteriosclerosis and aortic atherosclerosis. Electronically Signed   By: Tollie Eth M.D.   On: 04/15/2016 13:29   Ir Cm Inj Any Colonic Tube W/fluoro  Result Date: 04/13/2016 INDICATION: 75 year old with a GJ feeding tube. The jejunal lumen has been occluded for few days. Patient presents for tube evaluation and possible exchange. EXAM: GI TUBE INJECTION MEDICATIONS: None ANESTHESIA/SEDATION: None CONTRAST:  20 mL Omnipaque 300 - administered into the gastric lumen. FLUOROSCOPY TIME:  Fluoroscopy Time: 30 seconds, 1.4 mGy  COMPLICATIONS: None immediate. PROCEDURE: Informed written consent was obtained from the patient's daughter after a thorough discussion of the procedural risks, benefits and alternatives. All questions were addressed. Maximal Sterile Barrier Technique was utilized including caps, mask, sterile gowns, sterile gloves, sterile drape, hand hygiene and skin antiseptic. A timeout was performed prior to the initiation of the procedure. Patient was placed supine on the interventional table. The jejunal lumen was clogged. The lumen was flushed with a 10 cc syringe and was successfully unclogged. Contrast injection confirmed tube placement in the small bowel. The gastric lumen was also injected and confirmed placement in the stomach. Retention balloon remains inflated within the stomach. Patient's heart rate was irregular when he presented. Patient was placed on a monitor and appeared to have frequent premature atrial contractions. This finding was discussed with the patient's PCP, Dr. [name]. Patient was asymptomatic. IMPRESSION: The GJ tube was successfully unclogged. Jejunal tube is appropriately positioned in the small bowel and ready for use. Electronically Signed   By: Richarda Overlie M.D.   On: 04/13/2016 10:26   Dg Swallowing Func-speech Pathology  Result Date: 03/25/2016 Objective Swallowing Evaluation: Type of Study: MBS-Modified Barium Swallow Study Patient Details Name: MUSA REWERTS MRN: [id number] Date of Birth: 1941-01-11 Today's Date: 03/25/2016 Time: SLP Start Time (ACUTE ONLY):  1100-SLP Stop Time (ACUTE ONLY): 1130 SLP Time Calculation (min) (ACUTE ONLY): 30 min Past Medical History: Past Medical History: Diagnosis Date . Aspiration pneumonia (HCC) 01/17/2016 . Bilateral dry eyes  . Bile peritonitis due to gastric tube dislodgement 12/03/2015 . BPH (benign prostatic hyperplasia)  . Central pterygium of right eye  . Chronic respiratory failure with hypoxia (HCC)  . Coronary artery disease 1997, 2009  bare  mental stent right coronart artery, occlusive followed by Dr. Garnette Scheuermann in [place] . Degenerative disc disease   LOWER BACK . Dysphonia  . ED (erectile dysfunction)  . Gastric outlet obstruction 11/24/2015 . Gastroesophageal reflux disease  . Hyperlipidemia  . Hyperopia of both eyes with astigmatism and presbyopia  . Hypertension  . Ischemic right index finger at tip 12/09/2015 . Nuclear senile cataract   [facility]. [facility] in [place] . Peptic ulcer  . Pneumatosis of intestines 12/11/2015 . Pneumonia   HISTORY OF IN CHILDHOOD . Posterior vitreous detachment 10/30/2012 . Pyloric stricture 11/24/2015 . Salzmann's nodular dystrophy of left eye  . SBO (small bowel obstruction) 01/20/2016 . Urinary hesitancy  . Wears glasses  Past Surgical History: Past Surgical History: Procedure Laterality Date . CARDIAC CATHETERIZATION   . CHOLECYSTECTOMY   . ESOPHAGOGASTRODUODENOSCOPY N/A 12/01/2015  Procedure: ESOPHAGOGASTRODUODENOSCOPY (EGD) BALLOON DILATION OF DUODENAL STRICTURE;  Surgeon: Karie Soda, MD;  [facility];  Service: General;  Laterality: N/A; . ESOPHAGOGASTRODUODENOSCOPY N/A 12/14/2015  Procedure: ESOPHAGOGASTRODUODENOSCOPY (EGD);  Surgeon: Vida Rigger, MD;  Location: [facility];  Service: Endoscopy;  Laterality: N/A;  Patient has tracheostomy . ESOPHAGOGASTRODUODENOSCOPY (EGD) WITH PROPOFOL N/A 07/01/2015  Procedure: ESOPHAGOGASTRODUODENOSCOPY (EGD) WITH PROPOFOL;  Surgeon: Willis Modena, MD;  [facility];  Service: Endoscopy;  Laterality: N/A; . ESOPHAGOGASTRODUODENOSCOPY (EGD) WITH PROPOFOL N/A 11/23/2015  Procedure: ESOPHAGOGASTRODUODENOSCOPY (EGD) WITH PROPOFOL;  Surgeon: Willis Modena, MD;  Location: [facility];  Service: Endoscopy;  Laterality: N/A;  may need to intubate . EYE SURGERY    growth on right eye removed  . IR GENERIC HISTORICAL  12/13/2015  IR REPLC DUODEN/JEJUNO TUBE PERCUT W/FLUORO 12/13/2015 [name], MD [facility] . IR GENERIC HISTORICAL  02/03/2016  IR  REPLC GASTRO/COLONIC TUBE PERCUT W/FLUORO 02/03/2016 Berdine Dance, MD [facility] . IR GENERIC HISTORICAL  02/04/2016  IR GASTR TUBE CONVERT GASTR-JEJ PER W/FL MOD SED 02/04/2016 Malachy Moan, MD [facility] . IR GENERIC HISTORICAL  02/25/2016  IR SINUS/FIST TUBE CHK-NON GI 02/25/2016 Irish Lack, MD [facility] . IR GENERIC HISTORICAL  03/18/2016  IR CM INJ ANY COLONIC TUBE W/FLUORO 03/18/2016 Simonne Come, MD [facility] . IR GENERIC HISTORICAL  03/18/2016  IR GJ TUBE CHANGE 03/18/2016 Simonne Come, MD [facility] . IR GENERIC HISTORICAL  03/22/2016  IR CM INJ ANY COLONIC TUBE W/FLUORO 03/22/2016 Simonne Come, MD [facility] . LAPAROSCOPIC GASTROSTOMY N/A 12/01/2015  Procedure: LAPAROSCOPIC PLACEMENT OF FEEDING JEJUNOSTOMY AND GASTROSTOMY TUBE;  Surgeon: Karie Soda, MD;  [facility];  Service: General;  Laterality: N/A; . LAPAROSCOPY N/A 12/03/2015  Procedure: LAPAROSCOPY DIAGNOSTIC, OMENTOPEXY, JEJUNOSTOMY, WASH OUT;  Surgeon: Karie Soda, MD;  [facility];  Service: General;  Laterality: N/A; . LAPAROTOMY N/A 02/06/2016  Procedure: EXPLORATORY LAPAROTOMY, LYSIS OF ADHESIONS, SEGMENTAL SMALL BOWEL RESECTION, GASTROJEJUNOSTOMY;  Surgeon: Chevis Pretty III, MD;  [facility];  Service: General;  Laterality: N/A; . STENT TO HEART    2 1997 AND 1 IN 1996 . TONSILLECTOMY   . XI ROBOTIC VAGOTOMY AND ANTRECTOMY N/A 09/25/2015  Procedure: XI ROBOTIC ANTERIOR AND POSTERIOR VAGOTOMY, BILROTH I  ANASTOMOSIS DOR FUNDIPLICATION, OMENTOPEXY  UPPER ENDOSCOPY;  Surgeon: Karie SodaSteven Avan Gullett, MD;  Location: [facility];  Service: General;  Laterality: N/A; HPI: [name] is a 76 y.o. male with h/o CAD and GERD who presents to the Emergency Department from [facility] by EMS complaining of nausea, vomiting, and diarrhea onset onset two days ago, worsening yesterday. Pt also has associated mild abdominal pain. Per daughter, pt has been admitted to the hospital recently for surgery in his abdomen; he has been  staying at [facility] since then. Daughter reported to MD that pt has been fed through a G tube which gave him diarrhea that eventually resolved with a change in formula.   Daughter also informed MD that pt gradually started feeding by mouth again x2 days with return of diarrhea along with nausea and vomiting. Daughter notes that the facility staff have tried to change his formula but with no relief to his symptoms.  Pt has known h/o dysphagia dysphonia, pna, peptic ulcer, GERD.  Last MBS done 12/29/15 showed moderate oropharyngeal dysphagia that looked marginally worse than test ono 12/17/15.  Pt left on ice chips only after oral care.  CXR 12/6 showed LLL consolidation ? ATX or pna.   Subjective: pt awake in chair Assessment / Plan / Recommendation CHL IP CLINICAL IMPRESSIONS 03/25/2016 Therapy Diagnosis Moderate pharyngeal phase dysphagia Clinical Impression Pt presents with moderate sensorimotor pharyngeal dysphagia resulting in silent aspiration of nectar thick liquids. Swallow function continues to be delayed and weak complicated by very weak cough mechanism. This results in delayed swallow initiaiton to the valluclua, intermittent flash penetration, one occurance of shallow frank penetration after the swallow and significant residue at the pyriform sinuses that was silently aspirated after the swallow with nectar thick liquids. Pt unable to clear pharyngeal residue with cued cough or double swallow. After 4 nectar thick boluses, pt gagged  and hocked up pharyngeal residue. Two trials of thin liquids were presented via spoon with what appeared to be timely swallow initiation. Safety with thin liquids was difficult to assess as pt began to immediately excessively wretch. Trials were discontinued as excessive wretching continued. Pt didn't produce any vomitous. These same wretching behaviors are present in ST session after consuming 2 to 3 tsp trials of thin liquids. If wretching had not taken place, swallow  study could have continued with further trials of thin liquids. Pt continues to be at high risk for aspiration complicated by GI issues that are present after ~ 6 tsp boluses of liquids. As a results, I recommend that pt remain NPO with small sips of thin H2O allowed under free water protocol as pt tolerates. Pt would also benefit from respiratory muscle training to strengthen cough and speech intensity. Education provided to PA. Of note, pt with wretching during ST session after only 3 to 4 tsp trials. Pt appears to have a gastrointestinal component that will affect his ability to return to PO intake for nutrition.  Impact on safety and function Risk for inadequate nutrition/hydration;Moderate aspiration risk   [facility] IP TREATMENT RECOMMENDATION 03/25/2016 Treatment Recommendations Therapy as outlined in treatment plan below   Prognosis 03/25/2016 Prognosis for Safe Diet Advancement Guarded Barriers to Reach Goals Cognitive deficits;Time post onset;Severity of deficits;Other (Comment) Barriers/Prognosis Comment -- [facility] IP DIET RECOMMENDATION 03/25/2016 SLP Diet Recommendations Free water protocol after oral care Liquid Administration via Spoon Medication Administration Via alternative means Compensations Small sips/bites Postural Changes Remain semi-upright after after feeds/meals (Comment);Seated upright at 90 degrees  [facility] IP OTHER RECOMMENDATIONS 03/25/2016 Recommended Consults Consider GI evaluation Oral Care Recommendations Oral care QID Other Recommendations Have oral suction available   [facility] IP FOLLOW UP RECOMMENDATIONS 03/25/2016 Follow up Recommendations Skilled Nursing facility   [facility] IP FREQUENCY AND DURATION 01/25/2016 Speech Therapy Frequency (ACUTE ONLY) min 2x/week Treatment Duration 1 week      [facility] IP ORAL PHASE 03/25/2016 Oral Phase WFL Oral - Pudding Teaspoon -- Oral - Pudding Cup -- Oral - Honey Teaspoon -- Oral - Honey Cup -- Oral - Nectar Teaspoon WFL Oral - Nectar Cup WFL Oral - Nectar Straw NT Oral - Thin  Teaspoon WFL Oral - Thin Cup WFL Oral - Thin Straw NT Oral - Puree NT Oral - Mech Soft NT Oral - Regular -- Oral - Multi-Consistency -- Oral - Pill -- Oral Phase - Comment --  [facility] IP PHARYNGEAL PHASE 03/25/2016 Pharyngeal Phase Impaired Pharyngeal- Pudding Teaspoon -- Pharyngeal -- Pharyngeal- Pudding Cup -- Pharyngeal -- Pharyngeal- Honey Teaspoon -- Pharyngeal -- Pharyngeal- Honey Cup -- Pharyngeal -- Pharyngeal- Nectar Teaspoon Delayed swallow initiation-vallecula;Penetration/Apiration after swallow;Significant aspiration (Amount);Pharyngeal residue - pyriform;Pharyngeal residue - valleculae;Pharyngeal residue - posterior pharnyx;Reduced pharyngeal peristalsis Pharyngeal Material enters airway, passes BELOW cords without attempt by patient to eject out (silent aspiration) Pharyngeal- Nectar Cup Delayed swallow initiation-vallecula;Reduced pharyngeal peristalsis;Penetration/Apiration after swallow;Significant aspiration (Amount);Pharyngeal residue - pyriform;Pharyngeal residue - valleculae;Pharyngeal residue - posterior pharnyx;Compensatory strategies attempted (with notebox) Pharyngeal Material enters airway, passes BELOW cords without attempt by patient to eject out (silent aspiration) Pharyngeal- Nectar Straw NT Pharyngeal -- Pharyngeal- Thin Teaspoon Delayed swallow initiation-vallecula;Pharyngeal residue - pyriform Pharyngeal Material enters airway, remains ABOVE vocal cords then ejected out Pharyngeal- Thin Cup NT Pharyngeal -- Pharyngeal- Thin Straw NT Pharyngeal -- Pharyngeal- Puree NT Pharyngeal -- Pharyngeal- Mechanical Soft -- Pharyngeal -- Pharyngeal- Regular NT Pharyngeal -- Pharyngeal- Multi-consistency -- Pharyngeal -- Pharyngeal- Pill -- Pharyngeal -- Pharyngeal Comment --  [facility] IP CERVICAL ESOPHAGEAL PHASE 01/25/2016 Cervical Esophageal Phase Impaired Pudding Teaspoon -- Pudding Cup -- Honey Teaspoon -- Honey Cup -- Nectar Teaspoon Reduced cricopharyngeal relaxation Nectar Cup Reduced cricopharyngeal  relaxation Nectar Straw -- Thin Teaspoon Reduced cricopharyngeal relaxation Thin Cup Reduced cricopharyngeal relaxation Thin Straw Reduced cricopharyngeal relaxation Puree Reduced cricopharyngeal relaxation;Esophageal backflow into cervical esophagus Mechanical Soft -- Regular -- Multi-consistency -- Pill -- Cervical Esophageal Comment -- No flowsheet data found. Happi B. Dreama Saa M.S., CCC-SLP Speech-Language Pathologist Happi Dreama Saa 03/25/2016, 12:53 PM               Note: This dictation was prepared with Dragon/digital dictation along with [employer] technology. Any transcriptional errors that result from this process are unintentional.   .Ardeth Sportsman, M.D., F.A.C.S. Gastrointestinal and Minimally Invasive Surgery [facility] [address] [place], [place] [address] [phone] Main / Paging  04/22/2016 11:43 AM

## 2016-04-22 NOTE — Progress Notes (Signed)
Dietician page to [phone] per Dr. Thedore MinsSingh

## 2016-04-22 NOTE — Progress Notes (Signed)
Nutrition Brief Note  Met with pt's wife at bedside and provided detailed instructions on how to gradually increase volume of tube feedings at home. Wrote down times and volumes of feeds to provide today and tomorrow and discussed goal tube feeding regimen. Wife voiced understanding of information provided. Home health to meet with patient and wife tonight once they get home. Wife denies any further questions or concerns regarding tube feeding regimen at this time.  Scarlette Ar RD, LDN, CSP Inpatient Clinical Dietitian Pager: [phone] After Hours Pager: [phone]

## 2016-04-22 NOTE — Progress Notes (Signed)
Inpatient Rehabilitation  Continuing to follow along for potential IP Rehab admission.  [facility] to determine if denial will be over turned.  Plan to update the team as I know.  If patient continues to have medical need for inpatient status and remains in house we would consider admission.  Please call with questions.   Charlane FerrettiMelissa Katrenia Alkins, M.A., CCC/SLP Admission Coordinator  [facility]  Cell [phone]

## 2016-04-22 NOTE — Progress Notes (Signed)
Inpatient Rehabilitation  Received call from Jiles CrockerBrenda Chandler, who notified me of medical readiness and discharge today per MD.  I am not likely to receive insurance authorization today and shared this with her.  I called spouse to offer private pay admission to IP Rehab; however, they cannot afford and has elected to go home with home health.  Please call with questions.   Charlane FerrettiMelissa Dwayn Moravek, M.A., CCC/SLP Admission Coordinator  [facility]  Cell [phone]

## 2016-04-22 NOTE — Progress Notes (Signed)
All d/c instructions explained and given to pt's wife.  D/c off floor at 1820 via ambulance.  Amanda PeaNellie Lacharles Altschuler, Charity fundraiserN.

## 2016-04-22 NOTE — Progress Notes (Addendum)
Nutrition Follow-up   INTERVENTION:   Today: Transition to bolus TF of Jevity 1.5 via PEG/G-tube; start with 120 ml bolus and increase by 60 ml every 2 hours until pt is able to tolerate 474 ml (2 cans). 1200 hr-120 ml, 1400 hr-180 ml, 1600hr-240 ml, 1800 hr-337m, 2000 hr- 360 ml, 2200 hr- 420 ml  04/23/16: Provide Goal TF Regimen: 0800-474 ml (2 cans), 1300-474 ml (2 cans), 1800hr-474 ml (2 cans)  Home Health Needs: Provide 2 cans (4764m of Jevity 1.5 TID plus 30 ml Pro-stat once daily via G-tube to provide 2233 kcal, 106 grams of protein, and 1081 ml of fluid daily. Provide 30 ml water flush before and after every feeding and medication.    NUTRITION DIAGNOSIS:   Inadequate oral intake related to altered GI function as evidenced by other (see comment) (multiple past abdominal surgeries requiring nutrition support to meet needs).  ongoing  GOAL:   Patient will meet greater than or equal to 90% of their needs  Being met  MONITOR:   TF tolerance, Weight trends, Labs, I & O's, Other (Comment) (TPN regimen)  REASON FOR ASSESSMENT:   Consult Enteral/tube feeding initiation and management  ASSESSMENT:   [age].o. male  past mental history significant for chronic hypoxic respiratory failure, feeding tube, high blood pressure, low back pain, who presents emergency room with complaint shortness of breath. History mostly gathered from patient's wife as patient is demented at baseline. Per patient's wife patient has been in the hospital the last 2 months. Patient has been acutely short of breath for the last 2 days. Patient has been having a productive cough. Patient has had no fevers, chills, nausea, vomiting, diarrhea. No medications tried for patient's symptoms. Patient was brought to the emergency room for continuous complaints of shortness of breath.  Pt transition to TF via PEG yesterday. He received Jevity 1.5@75ml /hr via PEG until ~10 AM this morning. RN reports that patient has  tolerated TF well without complications. Pt asleep at time of visit; no family at bedside. Weight is up 27 lbs today-?accuracy of this. Discussed with RN plan to start bolus TF , start with low volume and increase by 60 ml every 2 hours.   Labs: low calcium  Diet Order:   NPO  Skin:  Reviewed, no issues  Last BM:  3/9  Height:   Ht Readings from Last 1 Encounters:  04/15/16 6' (1.829 m)    Weight:   Wt Readings from Last 1 Encounters:  04/22/16 194 lb 12.8 oz (88.4 kg)    Ideal Body Weight:  80.91 kg  BMI:  Body mass index is 26.42 kg/m.  Estimated Nutritional Needs:   Kcal:  2150-2320  Protein:  100-116 grams  Fluid:  2 L/day  EDUCATION NEEDS:   No education needs identified at this time  ReScarlette ArD, LDN, CSP Inpatient Clinical Dietitian Pager: [phone] Hours Pager: [phone]

## 2016-04-22 NOTE — Progress Notes (Addendum)
Attending MD informed CM that patient is ready for discharge today. TCT Melissa with Inpt Rehab about the Appeal Process results, she is not sure of if a decision will be made today. TCT patient's spouse; [facility] ;No DME is needed; she will make arrange for additional support in the home; She had several questions about Inpt Rehab, CM referred her to contact Geraldo PitterMelissa; B Katsumi Wisler RN,MHA,BSN [phone]  09:58 am Received call from patient's spouse; she is requesting a late discharge home today around 6 pm; patient will be transported home via ambulance- address verified with spouse; [facility] called, [facility]Donna [facility] the spouse about his tube feedings (noted no TPN); Also patient is to have an extension placed on his hospital bed; Brad with [facility] called to have it delivered today; Spouse is calling her private caregivers for assistance; B Ave Filterhandler RN,MHA,BSN

## 2016-04-27 ENCOUNTER — Telehealth: Payer: Self-pay | Admitting: *Deleted

## 2016-04-27 NOTE — Telephone Encounter (Signed)
Requesting vo for ST 1wk4 . Approval given.

## 2016-04-28 LAB — ACETYLCHOLINE RECEPTOR AB, ALL
ACETYLCHOL BLOCK AB: 17 % (ref 0–25)
Acetylcholine Modulat Ab: <12 % (ref 0–20)

## 2016-05-04 ENCOUNTER — Encounter: Payer: Self-pay | Admitting: Neurology

## 2016-05-04 ENCOUNTER — Ambulatory Visit (INDEPENDENT_AMBULATORY_CARE_PROVIDER_SITE_OTHER): Payer: Medicare Other | Admitting: Neurology

## 2016-05-04 VITALS — BP 120/54 | HR 89 | Ht 72.0 in | Wt 167.5 lb

## 2016-05-04 DIAGNOSIS — R471 Dysarthria and anarthria: Secondary | ICD-10-CM

## 2016-05-04 NOTE — Progress Notes (Signed)
Reason for visit: Dysarthria, dysphagia  Referring physician: Dr. Kirk Ruths [name] is a 76 y.o. male  History of present illness:  [name] is a 76 year old right-handed white male with a history of problems with hoarseness of the voice and voice alteration that began in February 2017. The patient developed a memory disorder around the same time. He underwent MRI of the brain in May 2017 which did not show evidence of significant small vessel ischemic changes. The patient was seen through Dr. Everlena Cooper from neurology in July 2017, further workup was not recommended at that time. The patient has been seen through ENT in November 2016 and inflammation of the vocal cords was noted at that time associated with gastroesophageal reflux disease. The patient eventually was found to have a peptic ulcer, he underwent surgery, but he has been in and out of the hospital since August or September through December 2017. The patient had required intubation, and eventually required a tracheotomy. The patient has been noted to have oropharyngeal dysphagia, he has had at least 2 modified barium swallow evaluations through speech therapy showing a moderate level of pharyngeal dysfunction and weakness resulting in aspiration. The patient has required a feeding tube placement. He has lost about 40-50 pounds during this period of time. The patient continues to have hoarseness, with a raspy quality to the speech and some dysarthria. The patient has not noted any numbness or weakness of the extremities, he does have gait instability, he is using a walker for ambulation. He is getting physical and speech therapy ongoing. He was recently in the hospital in March 2018 with congestive heart failure and shortness of breath. He is not on oxygen at home. The patient does have some low back pain issues, he denies any neck pain, he denies any issues controlling the bowels or the bladder. He does some ongoing memory problems, he  has not had any headaches or dizziness. He denies double vision or ptosis of the eyes. He has recently undergone an acetylcholine receptor antibody test that was negative. He is sent to this office for further evaluation.  Past Medical History:  Diagnosis Date  . 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 Aspiration pneumonia (HCC) 01/17/2016  . Bilateral dry eyes   . Bile peritonitis due to gastric tube dislodgement 12/03/2015  . BPH (benign prostatic hyperplasia)   . Central pterygium of right eye   . Chronic respiratory failure with hypoxia (HCC)   . Coronary artery disease 1997, 2009   bare mental stent right coronart artery, occlusive followed by Dr. Garnette Scheuermann in [place]  . Degenerative disc disease    LOWER BACK  . Dysphonia   . ED (erectile dysfunction)   . 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 Gastric outlet obstruction 11/24/2015  . Gastroesophageal reflux disease   . Hyperlipidemia   . Hyperopia of both eyes with astigmatism and presbyopia   . Hypertension   . Ischemic right index finger at tip 12/09/2015  . Nuclear senile cataract    Dr. [facility] in [place]  . Peptic ulcer   . Pneumatosis of intestines 12/11/2015  . Pneumonia    HISTORY OF IN CHILDHOOD  . Posterior vitreous detachment 10/30/2012  . Pyloric stricture 11/24/2015  . Salzmann's nodular dystrophy of left eye   . SBO (small bowel obstruction) 01/20/2016  . Urinary hesitancy   . Wears glasses     Past Surgical History:  Procedure Laterality Date  . CARDIAC CATHETERIZATION    . CHOLECYSTECTOMY    . ESOPHAGOGASTRODUODENOSCOPY N/A 12/01/2015   Procedure: ESOPHAGOGASTRODUODENOSCOPY (EGD) BALLOON DILATION OF DUODENAL STRICTURE;  Surgeon: Karie SodaSteven Gross, MD;  Location: [facility];  Service: General;  Laterality: N/A;  . ESOPHAGOGASTRODUODENOSCOPY N/A 12/14/2015   Procedure: ESOPHAGOGASTRODUODENOSCOPY (EGD);   Surgeon: Vida RiggerMarc Magod, MD;  [facility];  Service: Endoscopy;  Laterality: N/A;  Patient has tracheostomy  . ESOPHAGOGASTRODUODENOSCOPY (EGD) WITH PROPOFOL N/A 07/01/2015   Procedure: ESOPHAGOGASTRODUODENOSCOPY (EGD) WITH PROPOFOL;  Surgeon:  ModenaWilliam Outlaw, MD;  Location: [facility];  Service: Endoscopy;  Laterality: N/A;  . ESOPHAGOGASTRODUODENOSCOPY (EGD) WITH PROPOFOL N/A 11/23/2015   Procedure: ESOPHAGOGASTRODUODENOSCOPY (EGD) WITH PROPOFOL;  Surgeon:  ModenaWilliam Outlaw, MD;  [facility];  Service: Endoscopy;  Laterality: N/A;  may need to intubate  . EYE SURGERY     growth on right eye removed   . IR GENERIC HISTORICAL  12/13/2015   IR REPLC DUODEN/JEJUNO TUBE PERCUT W/FLUORO 12/13/2015 Jolaine ClickArthur Hoss, MD [facility]  . IR GENERIC HISTORICAL  02/03/2016   IR REPLC GASTRO/COLONIC TUBE PERCUT W/FLUORO 02/03/2016 Berdine DanceMichael Shick, MD [facility]  . IR GENERIC HISTORICAL  02/04/2016   IR GASTR TUBE CONVERT GASTR-JEJ PER W/FL MOD SED 02/04/2016 Malachy MoanHeath McCullough, MD [facility]  . IR GENERIC HISTORICAL  02/25/2016   IR SINUS/FIST TUBE CHK-NON GI 02/25/2016 Irish LackGlenn Yamagata, MD [facility]  . IR GENERIC HISTORICAL  03/18/2016   IR CM INJ ANY COLONIC TUBE W/FLUORO 03/18/2016 Simonne ComeJohn Watts, MD [facility]  . IR GENERIC HISTORICAL  03/18/2016   IR GJ TUBE CHANGE 03/18/2016 Simonne ComeJohn Watts, MD [facility]  . IR GENERIC HISTORICAL  03/22/2016   IR CM INJ ANY COLONIC TUBE W/FLUORO 03/22/2016 Simonne ComeJohn Watts, MD [facility]  . IR GENERIC HISTORICAL  04/13/2016   IR CM INJ ANY COLONIC TUBE W/FLUORO 04/13/2016 Richarda OverlieAdam Henn, MD [facility]  . LAPAROSCOPIC GASTROSTOMY N/A 12/01/2015   Procedure: LAPAROSCOPIC PLACEMENT OF FEEDING JEJUNOSTOMY AND GASTROSTOMY TUBE;  Surgeon: Karie SodaSteven Gross, MD;  Location: [facility];  Service: General;  Laterality: N/A;  . LAPAROSCOPY N/A 12/03/2015   Procedure: LAPAROSCOPY DIAGNOSTIC, OMENTOPEXY, JEJUNOSTOMY, WASH OUT;  Surgeon: Karie SodaSteven Gross, MD;  Location: [facility];  Service: General;   Laterality: N/A;  . LAPAROTOMY N/A 02/06/2016   Procedure: EXPLORATORY LAPAROTOMY, LYSIS OF ADHESIONS, SEGMENTAL SMALL BOWEL RESECTION, GASTROJEJUNOSTOMY;  Surgeon: Chevis PrettyPaul Toth III, MD;  Location: [facility];  Service: General;  Laterality: N/A;  . STENT TO HEART     2 1997 AND 1 IN 1996  . TONSILLECTOMY    . XI ROBOTIC VAGOTOMY AND ANTRECTOMY N/A 09/25/2015   Procedure: XI ROBOTIC ANTERIOR AND POSTERIOR VAGOTOMY, BILROTH I  ANASTOMOSIS DOR FUNDIPLICATION, OMENTOPEXY  UPPER ENDOSCOPY;  Surgeon: Karie SodaSteven Gross, MD;  Location: [facility];  Service: General;  Laterality: N/A;    Family History  Problem Relation Age of Onset  . Heart attack Mother     Social history:  reports that he quit smoking about 21 years ago. His smoking use included Cigarettes. He has a 40.00 pack-year smoking history. He has never used smokeless tobacco. He reports that he does not drink alcohol or use drugs.  Medications:  Prior to Admission medications   Medication Sig Start Date End Date Taking? Authorizing Provider  Alprazolam 1 MG/ML CONC Place 0.5 mLs (0.5 mg total) into feeding tube at bedtime and may repeat dose one time if needed. 04/22/16  Yes Leroy SeaPrashant K Singh, MD  Amino Acids-Protein Hydrolys (FEEDING SUPPLEMENT, PRO-STAT  SUGAR FREE 64,) LIQD Place 30 mLs into feeding tube daily. 04/23/16  Yes Leroy Sea, MD  aspirin 81 MG tablet 81 mg by Per J Tube route daily.   Yes Historical Provider, MD  chlorhexidine (PERIDEX) 0.12 % solution 15 mLs by Mouth Rinse route 2 (two) times daily. 03/28/16  Yes Evlyn Kanner Love, PA-C  cycloSPORINE (RESTASIS) 0.05 % ophthalmic emulsion Place 1 drop into both eyes 2 (two) times daily. 02/26/16  Yes Jessica L Focht, PA  FLUoxetine (PROZAC) 20 MG/5ML solution GIVE INTO FEEDING TUBE ONCE DAILY. 04/14/16  Yes Ankit Karis Juba, MD  furosemide (LASIX) 40 MG tablet Take 1 tablet (40 mg total) by mouth daily. 04/23/16  Yes Leroy Sea, MD  ipratropium-albuterol (DUONEB) 0.5-2.5 (3) MG/3ML SOLN  Take 3 mLs by nebulization every 4 (four) hours as needed. 04/22/16  Yes Leroy Sea, MD  lidocaine (LIDODERM) 5 % Apply to back at 7 pm and remove at 7 am daily 03/28/16  Yes Evlyn Kanner Love, PA-C  lip balm (CARMEX) ointment Apply 1 application topically 2 (two) times daily. 02/26/16  Yes Jerre Simon, PA  liver oil-zinc oxide (DESITIN) 40 % ointment Apply topically 2 (two) times daily. 02/26/16  Yes Jerre Simon, PA  magic mouthwash SOLN Take 15 mLs by mouth 4 (four) times daily as needed for mouth pain (sore throat). 03/28/16  Yes Evlyn Kanner Love, PA-C  menthol-cetylpyridinium (CEPACOL) 3 MG lozenge Take 1 lozenge (3 mg total) by mouth as needed for sore throat. 02/26/16  Yes Jerre Simon, PA  Menthol-Methyl Salicylate (MUSCLE RUB) 10-15 % CREA Apply 1 application topically 3 (three) times daily before meals. 03/28/16  Yes Evlyn Kanner Love, PA-C  mouth rinse LIQD solution 15 mLs by Mouth Rinse route 2 times daily at 12 noon and 4 pm. 02/26/16  Yes Jerre Simon, PA  Multiple Vitamin (MULTIVITAMIN) LIQD Place 15 mLs into feeding tube daily. 03/31/16  Yes Evlyn Kanner Love, PA-C  nitroGLYCERIN (NITROSTAT) 0.4 MG SL tablet Place 1 tablet (0.4 mg total) under the tongue every 5 (five) minutes as needed for chest pain. 02/26/16  Yes Jerre Simon, PA  Nutritional Supplements (FEEDING SUPPLEMENT, JEVITY 1.5 CAL/FIBER,) LIQD Via PEG - Start with 120 ml bolus and increase by 60 ml every 2 hours until pt is able to tolerate 2 cans 1200 hr-120 ml, 1400 hr-180 ml, 1600hr-240 ml, 1800 hr-396ml, 2000 hr- 360 ml, 2200 hr- 420 ml - advance as tolerated. 04/22/16  Yes Leroy Sea, MD  Nutritional Supplements (FEEDING SUPPLEMENT, JEVITY 1.5 CAL/FIBER,) LIQD Place 474 mLs into feeding tube 3 (three) times daily. Use this dose once patient has tolerated the gradual advance dose well and is upto 2 cans at a time. 04/23/16  Yes Leroy Sea, MD  nystatin (MYCOSTATIN) 100000 UNIT/ML suspension Place 30 mLs into feeding  tube 4 (four) times daily. 04/13/16  Yes Historical Provider, MD  omeprazole (PRILOSEC) 2 mg/mL SUSP Place 20 mLs (40 mg total) into feeding tube daily. 03/29/16  Yes Evlyn Kanner Love, PA-C  polyvinyl alcohol (LIQUIFILM TEARS) 1.4 % ophthalmic solution Place 1 drop into both eyes every 8 (eight) hours as needed (dry). 02/26/16  Yes Jerre Simon, PA  prochlorperazine (COMPAZINE) 25 MG suppository Place 1 suppository (25 mg total) rectally every 12 (twelve) hours as needed for nausea or vomiting. 03/30/16  Yes Evlyn Kanner Love, PA-C  sodium chloride flush (NS) 0.9 % SOLN 10-40 mLs by Intracatheter route as  needed (flush). 02/26/16  Yes Jerre Simon, PA  Water For Irrigation, Sterile (FREE WATER) SOLN Flush feeding tube with 100cc water after each tube feed 04/22/16  Yes Leroy Sea, MD      Allergies  Allergen Reactions  . Flomax [Tamsulosin Hcl] Other (See Comments)    Leg weakness  . Lisinopril Cough  . Lorazepam Other (See Comments)    "i don't like it"  Prefers Xanax or valium  . Uroxatral [Alfuzosin Hcl Er] Other (See Comments)    Leg weakness    ROS:  Out of a complete 14 system review of symptoms, the patient complains only of the following symptoms, and all other reviewed systems are negative.  Weight loss, fatigue Blurred vision Shortness of breath, cough, snoring Difficulty with swallowing Diarrhea Memory loss, confusion, weakness, slurred speech, difficulty swallowing Too much sleep, decreased energy, change in appetite, disinterest in activities, hallucinations Snoring  Blood pressure (!) 120/54, pulse 89, height 6' (1.829 m), weight 167 lb 8 oz (76 kg).  Physical Exam  General: The patient is alert and cooperative at the time of the examination.  Eyes: Pupils are equal, round, and reactive to light. Discs are flat bilaterally.  Neck: The neck is supple, no carotid bruits are noted.  Respiratory: The respiratory examination is clear.  Cardiovascular: The  cardiovascular examination reveals an irregular rhythm, no obvious murmurs or rubs are noted.  Skin: Extremities are without significant edema.  Neurologic Exam  Mental status: The patient is alert and oriented x 3 at the time of the examination. The patient has apparent normal recent and remote memory, with an apparently normal attention span and concentration ability.  Cranial nerves: Facial symmetry is present. There is good sensation of the face to pinprick and soft touch bilaterally. The strength of the facial muscles and the muscles to head turning and shoulder shrug are normal bilaterally. Speech is hypophonic, raspy and quality, but there is some evidence of dysarthria as well, no aphasia. Extraocular movements are full. Visual fields are full. The tongue is midline, and the patient has symmetric elevation of the soft palate. The patient has a very prominent gag reflex. No obvious hearing deficits are noted.  Motor: The motor testing reveals 5 over 5 strength of all 4 extremities. Good symmetric motor tone is noted throughout.  Sensory: Sensory testing is intact to pinprick, soft touch, vibration sensation, and position sense on all 4 extremities. No evidence of extinction is noted.  Coordination: Cerebellar testing reveals good finger-nose-finger and heel-to-shin bilaterally.  Gait and station: Gait is slightly wide-based, unsteady, the patient is able to ambulate independently. Tandem gait was not attempted. Romberg is negative. No drift is seen.  Reflexes: Deep tendon reflexes are symmetric and normal bilaterally. Toes are downgoing bilaterally.   Assessment/Plan:  1. Dysphonia, dysarthria, dysphasia  2. Memory disturbance  The patient has had some issues with a change in voice quality with a raspy quality and hypophonia. These changes are likely related to the chronic issues with gastroesophageal reflux disease and irritation of the vocal cords. The patient does have some  evidence of dysarthria as well, and swallow studies have revealed pharyngeal weakness. The patient is at risk for aspiration. The patient has no evidence of muscle atrophy or fasciculations on clinical examination. The tongue is without atrophy, and with good strength, I suspect that the risk of ALS is low in this patient. The etiology of the dysphagia is not clear. The patient will be sent for further blood  work, he will have nerve conduction studies on one arm and one leg, EMG of one arm, one leg, and with some of the cranial nerve innervated muscles. The patient is deconditioned, this could be a factor in his dysphagia. He will follow-up for the EMG evaluation.   Marlan Palau MD 05/04/2016 10:00 AM  [facility] [address] [place], [place] [address]  Phone [phone] Fax [phone]

## 2016-05-06 ENCOUNTER — Ambulatory Visit
Admission: RE | Admit: 2016-05-06 | Discharge: 2016-05-06 | Disposition: A | Discharge status: Home / Self Care | Payer: Medicare Other | Source: Ambulatory Visit | Attending: Internal Medicine | Admitting: Internal Medicine

## 2016-05-06 ENCOUNTER — Other Ambulatory Visit: Payer: Self-pay | Admitting: Internal Medicine

## 2016-05-06 DIAGNOSIS — J40 Bronchitis, not specified as acute or chronic: Secondary | ICD-10-CM

## 2016-05-11 LAB — PARANEOPLASTIC PROFILE 1
Neuronal Nuc Ab (Ri), IFA: <1:10 {titer}
Neuronal Nuclear (Hu) Antibody (IB): <1:10 {titer}
Purkinje Cell (Yo) Autoantobodies- IFA: <1:10 {titer}

## 2016-05-11 LAB — ANGIOTENSIN CONVERTING ENZYME: Angio Convert Enzyme: 38 U/L (ref 14–82)

## 2016-05-11 LAB — ANA: Anti Nuclear Antibody(ANA): NEGATIVE

## 2016-05-11 LAB — CK: Total CK: 42 U/L (ref 24–204)

## 2016-05-11 LAB — ACETYLCHOLINE RECEPTOR, MODULATING

## 2016-05-11 LAB — ACETYLCHOLINE RECEPTOR, BLOCKING: ACETYLCHOL BLOCK AB: 17 % (ref 0–25)

## 2016-05-11 LAB — VGCC ANTIBODY: VGCC Antibody: NEGATIVE

## 2016-05-12 ENCOUNTER — Other Ambulatory Visit (HOSPITAL_COMMUNITY): Payer: Self-pay | Admitting: Surgery

## 2016-05-12 DIAGNOSIS — R1310 Dysphagia, unspecified: Secondary | ICD-10-CM

## 2016-05-16 ENCOUNTER — Telehealth: Payer: Self-pay | Admitting: Physical Medicine & Rehabilitation

## 2016-05-16 NOTE — Telephone Encounter (Signed)
Allen Derry SP with [facility] would like an order to extend therapy to continue for swallow function 1 x week for 2 more weeks current cert will end 04/14 please advise - [phone]

## 2016-05-16 NOTE — Telephone Encounter (Signed)
Approval given

## 2016-05-19 ENCOUNTER — Ambulatory Visit: Payer: Medicare Other | Admitting: Physical Medicine & Rehabilitation

## 2016-05-19 ENCOUNTER — Ambulatory Visit (HOSPITAL_COMMUNITY)
Admission: RE | Admit: 2016-05-19 | Discharge: 2016-05-19 | Disposition: A | Discharge status: Home / Self Care | Payer: Medicare Other | Source: Ambulatory Visit | Attending: Surgery | Admitting: Surgery

## 2016-05-19 DIAGNOSIS — R490 Dysphonia: Secondary | ICD-10-CM | POA: Insufficient documentation

## 2016-05-19 DIAGNOSIS — R1313 Dysphagia, pharyngeal phase: Secondary | ICD-10-CM | POA: Insufficient documentation

## 2016-05-19 DIAGNOSIS — H5203 Hypermetropia, bilateral: Secondary | ICD-10-CM | POA: Diagnosis not present

## 2016-05-19 DIAGNOSIS — J9611 Chronic respiratory failure with hypoxia: Secondary | ICD-10-CM | POA: Diagnosis not present

## 2016-05-19 DIAGNOSIS — I10 Essential (primary) hypertension: Secondary | ICD-10-CM | POA: Diagnosis not present

## 2016-05-19 DIAGNOSIS — K56609 Unspecified intestinal obstruction, unspecified as to partial versus complete obstruction: Secondary | ICD-10-CM | POA: Diagnosis not present

## 2016-05-19 DIAGNOSIS — Z8711 Personal history of peptic ulcer disease: Secondary | ICD-10-CM | POA: Diagnosis not present

## 2016-05-19 DIAGNOSIS — R1310 Dysphagia, unspecified: Secondary | ICD-10-CM | POA: Diagnosis present

## 2016-05-19 DIAGNOSIS — H2513 Age-related nuclear cataract, bilateral: Secondary | ICD-10-CM | POA: Insufficient documentation

## 2016-05-19 DIAGNOSIS — N400 Enlarged prostate without lower urinary tract symptoms: Secondary | ICD-10-CM | POA: Diagnosis not present

## 2016-05-25 ENCOUNTER — Telehealth: Payer: Self-pay | Admitting: *Deleted

## 2016-05-25 NOTE — Telephone Encounter (Signed)
Allen Derry, Speech pathologist, [facility] left a message asking for verbal to increase visit frequency to 2 times a week. She intends to recertify and asks for verbal orders to continue visits 2week2 followed by 1week2.  I contacted speech pathologist and gave verbal orders per office protocol.

## 2016-06-07 ENCOUNTER — Ambulatory Visit (INDEPENDENT_AMBULATORY_CARE_PROVIDER_SITE_OTHER): Payer: Self-pay | Admitting: Neurology

## 2016-06-07 ENCOUNTER — Encounter: Payer: Self-pay | Admitting: Neurology

## 2016-06-07 ENCOUNTER — Ambulatory Visit (INDEPENDENT_AMBULATORY_CARE_PROVIDER_SITE_OTHER): Payer: Medicare Other | Admitting: Neurology

## 2016-06-07 DIAGNOSIS — R5381 Other malaise: Secondary | ICD-10-CM

## 2016-06-07 DIAGNOSIS — R471 Dysarthria and anarthria: Secondary | ICD-10-CM | POA: Diagnosis not present

## 2016-06-07 NOTE — Progress Notes (Signed)
EMG and nerve conduction study done today shows no evidence of an anterior horn cell disease process. The patient will be followed over time, he will follow-up in about 4 months.

## 2016-06-07 NOTE — Progress Notes (Signed)
Please refer to EMG and nerve conduction study procedure note. 

## 2016-06-07 NOTE — Procedures (Signed)
HISTORY:  [name] is a 76 year old gentleman with a history of dysphagia, weight loss, deconditioning and generalized weakness. There have been concerns about a possible anterior horn cell disease process. The patient is being evaluated for the weakness and swallowing problems.  NERVE CONDUCTION STUDIES:  Nerve conduction studies were performed on the left upper extremity. The distal motor latencies and motor amplitudes for the median and ulnar nerves were within normal limits. The F wave latencies and nerve conduction velocities for these nerves were also normal, with exception that the F wave latency for the ulnar nerve was prolonged. The sensory latencies for the median and ulnar nerves were normal.  Nerve conduction studies were performed on the left lower extremity. The distal motor latency for the left posterior tibial nerve was prolonged with a low motor amplitude. No response was seen for the left peroneal nerve. The nerve conduction velocity for the left posterior tibial nerve was normal. The left sural and peroneal sensory latencies were unobtainable. The F wave latency for the left posterior tibial nerve was unobtainable.  EMG STUDIES:  EMG study was performed on the left upper extremity:  The first dorsal interosseous muscle reveals 2 to 4 K units with full recruitment. No fibrillations or positive waves were noted. The abductor pollicis brevis muscle reveals 2 to 4 K units with full recruitment. No fibrillations or positive waves were noted. The extensor indicis proprius muscle reveals 1 to 3 K units with full recruitment. No fibrillations or positive waves were noted. The biceps muscle reveals 1 to 2 K units with full recruitment. No fibrillations or positive waves were noted. The triceps muscle reveals 2 to 4 K units with full recruitment. No fibrillations or positive waves were noted. The anterior deltoid muscle reveals 2 to 3 K units with full recruitment. No  fibrillations or positive waves were noted. The cervical paraspinal muscles were tested at 2 levels. No abnormalities of insertional activity were seen at either level tested. There was good relaxation.  EMG study was performed on the left lower extremity:  The tibialis anterior muscle reveals 2 to 4K motor units with full recruitment. No fibrillations or positive waves were seen. The peroneus tertius muscle reveals 2 to 4K motor units with full recruitment. No fibrillations or positive waves were seen. The medial gastrocnemius muscle reveals 1 to 3K motor units with full recruitment. No fibrillations or positive waves were seen. The vastus lateralis muscle reveals 2 to 4K motor units with full recruitment. No fibrillations or positive waves were seen. The iliopsoas muscle reveals 2 to 4K motor units with full recruitment. No fibrillations or positive waves were seen. The lumbosacral paraspinal muscles were tested at 3 levels, and revealed no abnormalities of insertional activity at all 3 levels tested. There was good relaxation.  EMG evaluation was done on some of the left cranial nerve innervated muscles:  The frontalis muscle reveals 0.2 to 1K motor units with full recruitment. No fibrillations or positive waves were seen. The orbicularis oris muscle reveals 0.2 to 0.5K motor units with full recruitment. No fibrillations or positive waves were seen. The genioglossus muscle reveals 0.2 to 1K motor units with full recruitment. No fibrillations or positive waves were seen.  Fasciculations were not seen in any muscle tested of the left leg, arm, or cranial nerve innervated muscles.  IMPRESSION:  Nerve conduction studies done on the left upper and lower extremity shows evidence of a primarily axonal peripheral neuropathy of moderate severity. EMG evaluation of  the left upper and left lower extremities were relatively unremarkable, without evidence of an overlying radiculopathy. No evidence of a  myopathic disorder was seen, no evidence of fasciculations were seen. The left cranial nerve innervated muscles were unremarkable. Overall, no evidence of an anterior horn cell disease process was seen on this evaluation.  Marlan Palau MD 06/07/2016 12:56 PM  [facility] [address] [place], [place] [address]  Phone [phone] Fax [phone]

## 2016-06-24 DIAGNOSIS — R1310 Dysphagia, unspecified: Secondary | ICD-10-CM | POA: Diagnosis not present

## 2016-06-24 DIAGNOSIS — I110 Hypertensive heart disease with heart failure: Secondary | ICD-10-CM | POA: Diagnosis not present

## 2016-06-24 DIAGNOSIS — E43 Unspecified severe protein-calorie malnutrition: Secondary | ICD-10-CM | POA: Diagnosis not present

## 2016-06-24 DIAGNOSIS — K56699 Other intestinal obstruction unspecified as to partial versus complete obstruction: Secondary | ICD-10-CM | POA: Diagnosis not present

## 2016-06-24 DIAGNOSIS — J9600 Acute respiratory failure, unspecified whether with hypoxia or hypercapnia: Secondary | ICD-10-CM | POA: Diagnosis not present

## 2016-06-24 DIAGNOSIS — I5031 Acute diastolic (congestive) heart failure: Secondary | ICD-10-CM | POA: Diagnosis not present

## 2016-06-24 DIAGNOSIS — D638 Anemia in other chronic diseases classified elsewhere: Secondary | ICD-10-CM | POA: Diagnosis not present

## 2016-06-24 DIAGNOSIS — Z5181 Encounter for therapeutic drug level monitoring: Secondary | ICD-10-CM | POA: Diagnosis not present

## 2016-06-24 DIAGNOSIS — I2510 Atherosclerotic heart disease of native coronary artery without angina pectoris: Secondary | ICD-10-CM | POA: Diagnosis not present

## 2016-06-24 DIAGNOSIS — Z452 Encounter for adjustment and management of vascular access device: Secondary | ICD-10-CM | POA: Diagnosis not present

## 2016-06-24 DIAGNOSIS — L989 Disorder of the skin and subcutaneous tissue, unspecified: Secondary | ICD-10-CM | POA: Diagnosis not present

## 2016-06-24 DIAGNOSIS — K632 Fistula of intestine: Secondary | ICD-10-CM | POA: Diagnosis not present

## 2016-06-29 ENCOUNTER — Ambulatory Visit: Payer: Medicare Other

## 2016-06-29 ENCOUNTER — Ambulatory Visit: Payer: Medicare Other | Admitting: Physical Therapy

## 2016-07-07 ENCOUNTER — Ambulatory Visit: Payer: Medicare Other | Attending: Internal Medicine | Admitting: Rehabilitation

## 2016-07-07 ENCOUNTER — Encounter: Payer: Self-pay | Admitting: Rehabilitation

## 2016-07-07 ENCOUNTER — Ambulatory Visit: Payer: Medicare Other | Admitting: Speech Pathology

## 2016-07-07 DIAGNOSIS — R471 Dysarthria and anarthria: Secondary | ICD-10-CM

## 2016-07-07 DIAGNOSIS — M6281 Muscle weakness (generalized): Secondary | ICD-10-CM | POA: Diagnosis present

## 2016-07-07 DIAGNOSIS — R41841 Cognitive communication deficit: Secondary | ICD-10-CM

## 2016-07-07 DIAGNOSIS — R2681 Unsteadiness on feet: Secondary | ICD-10-CM | POA: Diagnosis present

## 2016-07-07 DIAGNOSIS — R2689 Other abnormalities of gait and mobility: Secondary | ICD-10-CM | POA: Insufficient documentation

## 2016-07-07 DIAGNOSIS — R1310 Dysphagia, unspecified: Secondary | ICD-10-CM | POA: Insufficient documentation

## 2016-07-07 NOTE — Therapy (Signed)
[facility] [address] [place], [place], [address] Phone: [phone]   Fax:  [phone]  Physical Therapy Evaluation  Patient Details  Name: Danny GalRalph T Kinyon MRN: [id number] Date of Birth: Aug 19, 1940 Referring Provider: Kirby FunkJohn Griffin, MD  Encounter Date: 07/07/2016      PT End of Session - 07/07/16 1619    Visit Number 1   Number of Visits 9   Date for PT Re-Evaluation 09/05/16   Authorization Type [employer] MCR-G Code on every 10th visit   PT Start Time 0932   PT Stop Time 1015   PT Time Calculation (min) 43 min   Activity Tolerance Patient tolerated treatment well   Behavior During Therapy [facility] for tasks assessed/performed      Past Medical History:  Diagnosis Date  . 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 Aspiration pneumonia (HCC) 01/17/2016  . Bilateral dry eyes   . Bile peritonitis due to gastric tube dislodgement 12/03/2015  . BPH (benign prostatic hyperplasia)   . Central pterygium of right eye   . Chronic respiratory failure with hypoxia (HCC)   . Coronary artery disease 1997, 2009   bare mental stent right coronart artery, occlusive followed [place] Dr. Garnette Scheu[place]ank [place]  . Degenerative disc disease    LOWER BACK  . Dysphonia   . ED (erectile dysfunction)   . 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 Gastric outlet obstruction 11/24/2015  . Gastroesophageal reflux disease   . Hyperlipidemia   . Hyperopia of both eyes with astigmatism and presbyopia   . Hypertension   . Ischemic right index finger at tip 12/09/2015  . Nuclear senile cataract    Dr. Bernadette HoitHayden [facility] [place]  . Peptic ulcer   . Pneumatosis of intestines 12/11/2015  . Pneumonia    HISTORY OF IN CHILDHOOD  . Posterior vitreous detachment 10/30/2012  . Pyloric stricture 11/24/2015  . Salzmann's nodular dystrophy of left eye   . SBO (small  bowel obstruction) (HCC) 01/20/2016  . Urinary hesitancy   . Wears glasses     Past Surgical History:  Procedure Laterality Date  . CARDIAC CATHETERIZATION    . CHOLECYSTECTOMY    . ESOPHAGOGASTRODUODENOSCOPY N/A 12/01/2015   Procedure: ESOPHAGOGASTRODUODENOSCOPY (EGD) BALLOON DILATION OF DUODENAL STRICTURE;  Surgeon: Karie SodaSteven Gross, MD;  Location: [facility];  Service: General;  Laterality: N/A;  . ESOPHAGOGASTRODUODENOSCOPY N/A 12/14/2015   Procedure: ESOPHAGOGASTRODUODENOSCOPY (EGD);  Surgeon: Vida RiggerMarc Magod, MD;  [facility];  Service: Endoscopy;  Laterality: N/A;  Patient has tracheostomy  . ESOPHAGOGASTRODUODENOSCOPY (EGD) WITH PROPOFOL N/A 07/01/2015   Procedure: ESOPHAGOGASTRODUODENOSCOPY (EGD) WITH PROPOFOL;  Surgeon: Willis ModenaWilliam Outlaw, MD;  Location: [facility];  Service: Endoscopy;  Laterality: N/A;  . ESOPHAGOGASTRODUODENOSCOPY (EGD) WITH PROPOFOL N/A 11/23/2015   Procedure: ESOPHAGOGASTRODUODENOSCOPY (EGD) WITH PROPOFOL;  Surgeon: Willis ModenaWilliam Outlaw, MD;  [facility];  Service: Endoscopy;  Laterality: N/A;  may need to intubate  . EYE SURGERY     growth on right eye removed   . IR GENERIC HISTORICAL  12/13/2015   IR REPLC DUODEN/JEJUNO TUBE PERCUT W/FLUORO 12/13/2015 Jolaine ClickArthur Hoss, MD [facility]  . IR GENERIC HISTORICAL  02/03/2016   IR REPLC GASTRO/COLONIC TUBE PERCUT W/FLUORO 02/03/2016 Berdine DanceMichael Shick, MD [facility]  . IR GENERIC HISTORICAL  02/04/2016   IR GASTR TUBE CONVERT GASTR-JEJ PER W/FL MOD SED 02/04/2016 Malachy MoanHeath McCullough, MD [facility]  . IR GENERIC HISTORICAL  02/25/2016   IR SINUS/FIST TUBE  CHK-NON GI 02/25/2016 Irish Lack, MD [facility]  . IR GENERIC HISTORICAL  03/18/2016   IR CM INJ ANY COLONIC TUBE W/FLUORO 03/18/2016 Simonne Come, MD [facility]  . IR GENERIC HISTORICAL  03/18/2016   IR GJ TUBE CHANGE 03/18/2016 Simonne Come, MD [facility]  . IR GENERIC HISTORICAL  03/22/2016   IR CM INJ ANY COLONIC TUBE W/FLUORO 03/22/2016 Simonne Come, MD [facility]  . IR GENERIC HISTORICAL  04/13/2016   IR CM INJ ANY COLONIC TUBE W/FLUORO 04/13/2016 Richarda Overlie, MD [facility]  . LAPAROSCOPIC GASTROSTOMY N/A 12/01/2015   Procedure: LAPAROSCOPIC PLACEMENT OF FEEDING JEJUNOSTOMY AND GASTROSTOMY TUBE;  Surgeon: Karie Soda, MD;  Location: [facility];  Service: General;  Laterality: N/A;  . LAPAROSCOPY N/A 12/03/2015   Procedure: LAPAROSCOPY DIAGNOSTIC, OMENTOPEXY, JEJUNOSTOMY, WASH OUT;  Surgeon: Karie Soda, MD;  Location: [facility];  Service: General;  Laterality: N/A;  . LAPAROTOMY N/A 02/06/2016   Procedure: EXPLORATORY LAPAROTOMY, LYSIS OF ADHESIONS, SEGMENTAL SMALL BOWEL RESECTION, GASTROJEJUNOSTOMY;  Surgeon: Chevis Pretty III, MD;  Location: [facility];  Service: General;  Laterality: N/A;  . STENT TO HEART     2 1997 AND 1 IN 1996  . TONSILLECTOMY    . XI ROBOTIC VAGOTOMY AND ANTRECTOMY N/A 09/25/2015   Procedure: XI ROBOTIC ANTERIOR AND POSTERIOR VAGOTOMY, BILROTH I  ANASTOMOSIS DOR FUNDIPLICATION, OMENTOPEXY  UPPER ENDOSCOPY;  Surgeon: Karie Soda, MD;  Location: [facility];  Service: General;  Laterality: N/A;    There were no vitals filed for this visit.       Subjective Assessment - 07/07/16 0939    Subjective "I had about 4 operations and got really weak.  I"ve been doing therapy at home, but now I need a more organized program."   Patient is accompained by: Family member  Carney Bern, wife; Doctor, hospital   Pertinent History History of several GI surgeries, dementia, swallowing/speech difficulties   Limitations House hold activities;Walking;Standing   Patient Stated Goals "I want to be able to walk."    Currently in Pain? No/denies            [facility] PT Assessment - 07/07/16 0941      Assessment   Medical Diagnosis Physical deconditioning   Referring Provider [name], MD   Onset Date/Surgical Date --  Since last August, had 4 operations and gotten weaker   Prior Therapy acute care then to IP rehab (was there 4 weeks), went home got asp PNA  and was rehospitalized and went home with HHPT/RN     Precautions   Precautions Fall     Restrictions   Weight Bearing Restrictions No     Balance Screen   Has the patient fallen in the past 6 months No   Has the patient had a decrease in activity level because of a fear of falling?  Yes   Is the patient reluctant to leave their home because of a fear of falling?  Yes     Home Environment   Living Environment Private residence   Living Arrangements Spouse/significant other;Non-relatives/Friends   Available Help at Discharge Family;Personal care attendant  Caregiver 7x/wk 7-2   Type of Home House   Home Access Stairs to enter   Entrance Stairs-Number of Steps 3   Entrance Stairs-Rails Right;Left;Cannot reach both   Home Layout One level   Home Equipment Walker - 2 wheels;Cane - single point;Shower seat;Grab bars - tub/shower;Hand held shower head  walk in shower      Prior Function  Level of Independence Independent   Vocation Retired   Leisure [employer], reading newspaper, would like to return to exercise group 3x/wk     Cognition   Overall Cognitive Status Impaired/Different from baseline   Area of Impairment Memory   Memory Decreased short-term memory     Sensation   Light Touch Appears Intact   Hot/Cold Appears Intact   Proprioception Appears Intact     Coordination   Gross Motor Movements are Fluid and Coordinated Yes  LEs   Fine Motor Movements are Fluid and Coordinated Yes     Posture/Postural Control   Posture/Postural Control Postural limitations   Postural Limitations Rounded Shoulders;Forward head;Flexed trunk     ROM / Strength   AROM / PROM / Strength Strength     Strength   Overall Strength Deficits   Overall Strength Comments R hip flex 4/5, L hip flex 3+/5, B knee ext 5/5, B knee flex 3+/5, B ankle DF 3+/5, B hip abd/add gross 5/5 in sitting     Transfers   Transfers Sit to Stand;Stand to Sit   Sit to Stand 5: Supervision   Sit to  Stand Details Verbal cues for sequencing;Verbal cues for technique;Verbal cues for precautions/safety   Five time sit to stand comments  19.91 secs with single arm rest and opposite hand on leg    Stand to Sit 5: Supervision     Ambulation/Gait   Ambulation/Gait Yes   Ambulation/Gait Assistance 5: Supervision;4: Min guard   Ambulation Distance (Feet) 115 Feet   Assistive device Straight cane   Gait Pattern Step-through pattern;Decreased stride length;Right flexed knee in stance;Left flexed knee in stance;Trunk flexed   Ambulation Surface Level;Indoor   Gait velocity 1.61 ft/sec with SPC   Stairs Yes   Stairs Assistance 5: Supervision   Stair Management Technique One rail Right;With cane;Alternating pattern;Forwards   Number of Stairs 4   Height of Stairs 6     Standardized Balance Assessment   Standardized Balance Assessment Berg Balance Test     Berg Balance Test   Sit to Stand Able to stand  independently using hands   Standing Unsupported Able to stand safely 2 minutes   Sitting with Back Unsupported but Feet Supported on Floor or Stool Able to sit safely and securely 2 minutes   Stand to Sit Sits independently, has uncontrolled descent   Transfers Able to transfer safely, definite need of hands   Standing Unsupported with Eyes Closed Able to stand 10 seconds with supervision   Standing Ubsupported with Feet Together Able to place feet together independently and stand for 1 minute with supervision   From Standing, Reach Forward with Outstretched Arm Can reach forward >12 cm safely (5")   From Standing Position, Pick up Object from Floor Able to pick up shoe, needs supervision   From Standing Position, Turn to Look Behind Over each Shoulder Looks behind one side only/other side shows less weight shift   Turn 360 Degrees Able to turn 360 degrees safely but slowly   Standing Unsupported, Alternately Place Feet on Step/Stool Able to complete 4 steps without aid or supervision    Standing Unsupported, One Foot in Front Able to plae foot ahead of the other independently and hold 30 seconds   Standing on One Leg Tries to lift leg/unable to hold 3 seconds but remains standing independently   Total Score 38   Berg comment: 37-45 significant (>80%) 52-55 lower (> 25%)  PT Education - 07/07/16 1618    Education provided Yes   Education Details education on POC, goals, evaluation findings   Person(s) Educated Patient;Spouse;Caregiver(s)   Methods Explanation   Comprehension Verbalized understanding          PT Short Term Goals - 07/07/16 1909      PT SHORT TERM GOAL #1   Title Pt/family/caregiver will be independent with initial HEP in order to indicate improved functional mobility and decreased fall risk.  (Target Date: 08/06/16)   Status New     PT SHORT TERM GOAL #2   Title Pt will improve BERG balance score to 41/56 in order to indicate decreased fall risk.     Status New     PT SHORT TERM GOAL #3   Title Pt will perform 5TSS in <17 secs with single UE support in order to indicate improved fuctional strength and decreased fall risk.     Status New     PT SHORT TERM GOAL #4   Title Pt will improve gait speed to 2.21 ft/sec w/ LRAD in order to indicate improved efficiency of gait and decreased fall risk.     Status New     PT SHORT TERM GOAL #5   Title Will assess and improve distance by 26' from baseline in order to indicate improved functional endurance.     Status New           PT Long Term Goals - 07/07/16 1913      PT LONG TERM GOAL #1   Title Pt/wife/caregiver will be independent with final HEP in order to indicate continued fitness and decreased fall risk.  (Target Date: 09/05/16)   Status New     PT LONG TERM GOAL #2   Title Pt will improve BERG balance score to 44/56 in order to indicate decreased fall risk.     Status New     PT LONG TERM GOAL #3   Title Pt will perform 5TSS in </=15  secs without UE support in order to indicate decreased fall risk and improved functional strength.     Status New     PT LONG TERM GOAL #4   Title Pt will improve gait speed to >/=2.62 w/ LRAD in order to indicate improved efficiency of gait and decreased fall risk.     Status New     PT LONG TERM GOAL #5   Title Pt will ambulate up to 500' over unlevel paved outdoor surfaces w/ LRAD at S level in order to indicate return to community negotiation.     Status New     Additional Long Term Goals   Additional Long Term Goals Yes     PT LONG TERM GOAL #6   Title Pt will improve by 150' from baseline in order to indicate improved functional endurance.     Status New               Plan - 07/07/16 1620    Clinical Impression Statement Pt presents with physical deconditioning following 4 abdominal/GI related surgeries, beginning in August of 2017.  Note that there was suspicion of anterior horn cell disease, however nerve conduction was unremarkable for this at this time, but did show moderate severity peripheral neuropathy in LUE/LE.  Pt with difficulty swallowing and speech difficulties and is also having SLP evaluation today to address these deficits.  Note history of lumbar DDD, CAD, and dementia that could all impact progress with therapy, also  the unknown process causing these current deficits.  Upon PT evaluation, note gait speed of 1.61 ft/sec, 5TSS time of 19.91 secs with single UE support and BERG score of 38/56 all indicative of significant fall risk and decreased functional strength.  Pt is of evolving presentation and moderate complexity and will benefit from skilled OP neuro PT in order to address deficits.     Rehab Potential Good   PT Frequency 1x / week  due to high copay, would like to see 2x/wk   PT Duration 8 weeks   PT Treatment/Interventions ADLs/Self Care Home Management;Electrical Stimulation;DME Instruction;Gait training;Stair training;Functional mobility  training;Therapeutic activities;Therapeutic exercise;Balance training;Neuromuscular re-education;Patient/family education;Orthotic Fit/Training;Passive range of motion;Vestibular   PT Next Visit Plan , initiate HEP for BLE strength (sit<>stand) and balance, gait speed with improved quality of gait, increased stride length   Consulted and Agree with Plan of Care Patient;Family member/caregiver   Family Member Consulted wife and caregiver      Patient will benefit from skilled therapeutic intervention in order to improve the following deficits and impairments:  Abnormal gait, Decreased activity tolerance, Decreased balance, Decreased cognition, Decreased endurance, Decreased knowledge of use of DME, Decreased mobility, Decreased range of motion, Decreased strength, Impaired perceived functional ability, Impaired flexibility, Postural dysfunction  Visit Diagnosis: Unsteadiness on feet - Plan: PT plan of care cert/re-cert  Muscle weakness (generalized) - Plan: PT plan of care cert/re-cert  Other abnormalities of gait and mobility - Plan: PT plan of care cert/re-cert      G-Codes - 2016/08/05 1918    Functional Assessment Tool Used (Outpatient Only) BERG: 38/56   Functional Limitation Mobility: Walking and moving around   Mobility: Walking and Moving Around Current Status (R6045) At least 20 percent but less than 40 percent impaired, limited or restricted   Mobility: Walking and Moving Around Goal Status (W0981) At least 20 percent but less than 40 percent impaired, limited or restricted  38 to 44/56       Problem List Patient Active Problem List   Diagnosis Date Noted  . Delayed gastric emptying 04/22/2016  . Respiratory failure (HCC) 04/16/2016  . SOB (shortness of breath)   . NSVT (nonsustained ventricular tachycardia) (HCC)   . Elevated troponin 04/15/2016  . Acute on chronic diastolic heart failure (HCC)   . Gastric dysmotility   . Gastrojejunostomy tube 02/06/2016 03/10/2016   . Hyponatremia   . Anxiety and depression   . Sleep disturbance   . Orthostasis   . Leukocytosis   . Hyperglycemia   . FTT (failure to thrive) in adult   . Anemia of chronic disease 01/31/2016  . Stage 2 skin ulcer of sacral region (HCC) 01/31/2016  . Hypomagnesemia 01/21/2016  . Hypokalemia 01/21/2016  . Pressure injury of skin 01/20/2016  . Pleural effusion   . Gastroesophageal reflux disease 01/19/2016  . Insomnia 01/19/2016  . Skin ulcer of abdominal wall, limited to breakdown of skin (HCC) 01/17/2016  . Dysphagia 01/17/2016  . Debility 01/17/2016  . 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 Vitamin B12 deficiency 01/02/2016  . BPH (benign prostatic hyperplasia)   . Posterior vitreous detachment   . Salzmann's nodular dystrophy of left eye   . Hyperopia of both eyes with astigmatism and presbyopia   . Central pterygium of right eye   . Pressure ulcer of buttock, right, unstageable (HCC) 12/30/2015  . Hypernatremia 12/28/2015  . Tobacco abuse 12/09/2015  . Gastritis 12/09/2015  . Respiratory failure, acute (HCC)   . Protein-calorie malnutrition, severe 12/04/2015  .  Encephalopathy acute 12/03/2015  . Anxiety state 09/28/2015  . Hyperlipidemia   . 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 Coronary atherosclerosis of native coronary artery 04/24/2013  . Essential hypertension, benign 04/24/2013  . Other and unspecified hyperlipidemia 04/24/2013  . Central pterygium 10/30/2012  . 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 Cataract, nuclear sclerotic senile 10/30/2012    Harriet Butte, PT, MPT [facility] [address] [place], [place], [address] Phone: [phone]   Fax:  [phone] 07/07/16, 7:21 PM  Name: CORNEY KNIGHTON MRN: [id number] Date of Birth: 26-Jan-1941

## 2016-07-07 NOTE — Patient Instructions (Signed)
See assigned exercises handout

## 2016-07-08 DIAGNOSIS — R2681 Unsteadiness on feet: Secondary | ICD-10-CM | POA: Diagnosis not present

## 2016-07-08 NOTE — Therapy (Signed)
[facility] [address] [place], [place], [address] Phone: [phone]   Fax:  [phone]  Speech Language Pathology Evaluation  Patient Details  Name: [name] MRN: [id number] Date of Birth: 1940-07-16 Referring Provider: [name], MD  Encounter Date: 07/07/2016      End of Session - 07/08/16 0004    Visit Number 1   Number of Visits 9   Date for SLP Re-Evaluation 09/02/16   SLP Start Time 1016   SLP Stop Time  1100   SLP Time Calculation (min) 44 min   Activity Tolerance Patient tolerated treatment well      Past Medical History:  Diagnosis Date   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 09/25/2015 09/25/2015   Aspiration pneumonia (HCC) 01/17/2016   Bilateral dry eyes    Bile peritonitis due to gastric tube dislodgement 12/03/2015   BPH (benign prostatic hyperplasia)    Central pterygium of right eye    Chronic respiratory failure with hypoxia (HCC)    Coronary artery disease 1997, 2009   bare mental stent right coronart artery, occlusive followed by Dr. 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 [facility] in [place]   Peptic ulcer    Pneumatosis of intestines 12/11/2015   Pneumonia    HISTORY OF IN CHILDHOOD   Posterior vitreous detachment 10/30/2012   Pyloric stricture 11/24/2015   Salzmann's nodular dystrophy of left eye    SBO (small bowel obstruction) (HCC) 01/20/2016   Urinary hesitancy    Wears glasses     Past Surgical History:   Procedure Laterality Date   CARDIAC CATHETERIZATION     CHOLECYSTECTOMY     ESOPHAGOGASTRODUODENOSCOPY N/A 12/01/2015   Procedure: ESOPHAGOGASTRODUODENOSCOPY (EGD) BALLOON DILATION OF DUODENAL STRICTURE;  Surgeon: Karie Soda, MD;  Location: [facility];  Service: General;  Laterality: N/A;   ESOPHAGOGASTRODUODENOSCOPY N/A 12/14/2015   Procedure: ESOPHAGOGASTRODUODENOSCOPY (EGD);  Surgeon: Vida Rigger, MD;  Location: [facility];  Service: Endoscopy;  Laterality: N/A;  Patient has tracheostomy   ESOPHAGOGASTRODUODENOSCOPY (EGD) WITH PROPOFOL N/A 07/01/2015   Procedure: ESOPHAGOGASTRODUODENOSCOPY (EGD) WITH PROPOFOL;  Surgeon: Willis Modena, MD;  Location: [facility];  Service: Endoscopy;  Laterality: N/A;   ESOPHAGOGASTRODUODENOSCOPY (EGD) WITH PROPOFOL N/A 11/23/2015   Procedure: ESOPHAGOGASTRODUODENOSCOPY (EGD) WITH PROPOFOL;  Surgeon: Willis Modena, MD;  Location: [facility];  Service: Endoscopy;  Laterality: N/A;  may need to intubate   EYE SURGERY     growth on right eye removed    IR GENERIC HISTORICAL  12/13/2015   IR REPLC DUODEN/JEJUNO TUBE PERCUT W/FLUORO 12/13/2015 [name], MD [facility]   IR GENERIC HISTORICAL  02/03/2016   IR REPLC GASTRO/COLONIC TUBE PERCUT W/FLUORO 02/03/2016 Berdine Dance, MD [facility]   IR GENERIC HISTORICAL  02/04/2016   IR GASTR TUBE CONVERT GASTR-JEJ PER W/FL MOD SED 02/04/2016 Malachy Moan, MD [facility]   IR GENERIC HISTORICAL  02/25/2016   IR SINUS/FIST TUBE CHK-NON GI 02/25/2016 Irish Lack, MD [facility]   IR GENERIC HISTORICAL  03/18/2016   IR CM  INJ ANY COLONIC TUBE W/FLUORO 03/18/2016 Simonne Come, MD [facility]   IR GENERIC HISTORICAL  03/18/2016   IR GJ TUBE CHANGE 03/18/2016 Simonne Come, MD [facility]   IR GENERIC HISTORICAL  03/22/2016   IR CM INJ ANY COLONIC TUBE W/FLUORO 03/22/2016 Simonne Come, MD [facility]   IR GENERIC HISTORICAL  04/13/2016   IR CM INJ ANY COLONIC TUBE W/FLUORO 04/13/2016 Richarda Overlie, MD  [facility]   LAPAROSCOPIC GASTROSTOMY N/A 12/01/2015   Procedure: LAPAROSCOPIC PLACEMENT OF FEEDING JEJUNOSTOMY AND GASTROSTOMY TUBE;  Surgeon: Karie Soda, MD;  Location: [facility];  Service: General;  Laterality: N/A;   LAPAROSCOPY N/A 12/03/2015   Procedure: LAPAROSCOPY DIAGNOSTIC, OMENTOPEXY, JEJUNOSTOMY, WASH OUT;  Surgeon: Karie Soda, MD;  Location: [facility];  Service: General;  Laterality: N/A;   LAPAROTOMY N/A 02/06/2016   Procedure: EXPLORATORY LAPAROTOMY, LYSIS OF ADHESIONS, SEGMENTAL SMALL BOWEL RESECTION, GASTROJEJUNOSTOMY;  Surgeon: Chevis Pretty III, MD;  Location: [facility];  Service: General;  Laterality: N/A;   STENT TO HEART     2 1997 AND 1 IN 1996   TONSILLECTOMY     XI ROBOTIC VAGOTOMY AND ANTRECTOMY N/A 09/25/2015   Procedure: XI ROBOTIC ANTERIOR AND POSTERIOR VAGOTOMY, BILROTH I  ANASTOMOSIS DOR FUNDIPLICATION, OMENTOPEXY  UPPER ENDOSCOPY;  Surgeon: Karie Soda, MD;  Location: [facility];  Service: General;  Laterality: N/A;    There were no vitals filed for this visit.      Subjective Assessment - 07/07/16 1022    Subjective "because the way my voice is." re why here for ST            SLP Evaluation [facility] - 07/07/16 1018      SLP Visit Information   SLP Received On 07/07/16   Referring Provider Danny Funk, MD     Subjective   Patient/Family Stated Goal "get my voice better"     Pain Assessment   Currently in Pain? No/denies     General Information   HPI [name] is a 76 year old gentleman with a history of dysphagia, memory loss, weight loss, deconditioning and generalized weakness. Pt currently followed by neurology; there have been concerns about a possible anterior horn cell disease process, however recent EMG/nerve conduction study (06/07/16) with no evidence of an anterior horn cell disease process per MD. Complex medical hx including vagotomy and antrectomy 09/2015, laprosopic surgery with small bowel resection, respiratory failure resulting in  need for mechanical ventilation and trach placement, dysphagia with need for PEG for nutritional support, aspiration pneumonia, GERD, PUD, dysphonia.  Pt has undergone multiple MBS studies, several of which were interrupted by pts retching/gagging. Most recent MBS 05/19/16 showed moderate pharyngeal sensorimotor based dysphagia with improved function using chin tuck. SLP recommended starting po with strict precautions given silent nature of dysphagia. Pt worked with [facility] SLP and is tolerating regular diet with thin liquids with use of chin tuck. Pt has not had pneumonias nor weight loss per family since most recent exam and initiation of PO diet.    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;Personal care attendant   Vocation Retired     IT consultant   Overall Cognitive Status Impaired/Different from baseline   Area of Impairment Memory   Memory Decreased short-term memory;Decreased recall of precautions   Attention Sustained  Sustained Attention Appears intact   Memory Impaired   Memory Impairment Decreased recall of new information;Decreased short term memory   Decreased Short Term Memory Verbal basic;Functional basic   Awareness Impaired   Awareness Impairment Emergent impairment     Auditory Comprehension   Overall Auditory Comprehension Appears within functional limits for tasks assessed   Yes/No Questions Within Functional Limits   Commands Within Functional Limits   Conversation Moderately complex     Visual Recognition/Discrimination   Discrimination Not tested     Reading Comprehension   Reading Status Not tested     Expression   Primary Mode of Expression Verbal     Verbal Expression   Overall Verbal Expression Appears within functional limits for tasks assessed   Initiation  No impairment   Automatic Speech Name;Social Response   Level of Generative/Spontaneous Verbalization Conversation     Written Expression   Dominant Hand Right   Written Expression Not tested     Oral Motor/Sensory Function   Overall Oral Motor/Sensory Function Appears within functional limits for tasks assessed   Labial ROM Within Functional Limits   Labial Symmetry Within Functional Limits   Labial Strength Within Functional Limits   Labial Sensation Within Functional Limits   Labial Coordination WFL   Lingual ROM Within Functional Limits   Lingual Symmetry Within Functional Limits   Lingual Strength Within Functional Limits   Lingual Sensation Within Functional Limits   Lingual Coordination Reduced   Facial ROM Within Functional Limits   Facial Symmetry Within Functional Limits   Facial Strength Within Functional Limits   Facial Sensation Within Functional Limits   Velum Within Functional Limits   Mandible Within Functional Limits     Motor Speech   Overall Motor Speech Impaired   Respiration Impaired   Level of Impairment Sentence   Phonation (P)  Other (comment);Hoarse  intermittently dsyphonic   Resonance (P)  Hypernasality   Articulation (P)  Impaired   Level of Impairment (P)  Word   Intelligibility (P)  Intelligibility reduced   Word (P)  75-100% accurate   Phrase (P)  75-100% accurate   Sentence (P)  50-74% accurate   Conversation (P)  50-74% accurate   Motor Speech Errors (P)  Inconsistent   Effective Techniques (P)  Slow rate;Over-articulate   Phonation (P)  Impaired   Volume (P)  --  variable   Pitch (P)  Low                         SLP Education - 07/08/16 0004    Education provided Yes   Education Details evaluation findings, speech tasks for dysarthria   Person(s) Educated Patient;Spouse;Caregiver(s)   Methods Explanation;Handout   Comprehension Verbalized understanding;Need further instruction          SLP Short Term Goals  - 07/08/16 0009      SLP SHORT TERM GOAL #1   Title pt will complete HEP for dysarthria with rare min A over three sessions   Time 4   Period Weeks   Status New     SLP SHORT TERM GOAL #2   Title pt will use compensatory strategies for dysarthria 80% of the time in sentence level responses   Time 4   Period Weeks     SLP SHORT TERM GOAL #3   Title Pt will participate in assessment of cognitive communication within first 2 sessions.   Time 4   Period Weeks   Status New  SLP Long Term Goals - 07/08/16 0012      SLP LONG TERM GOAL #1   Title pt will demo HEP with modified independence over two sessions   Time 8   Period Weeks   Status New     SLP LONG TERM GOAL #2   Title Pt will use compensatory strategies for dysarthria to improve intelligiblity to 75% over 5 minutes mod complex conversation.   Time 8   Period Weeks   Status New     SLP LONG TERM GOAL #3   Title pt will tell SLP 3 s/s aspiration PNA with modified independence   Time 8   Period Weeks   Status New          Plan - 07/08/16 0005    Clinical Impression Statement Patient presents with moderate dysarthria, sensorimotor-based dysphagia(per recent MBS) and memory impairment. While questionable anterior horn disease process was not confirmed during most recent neurology visit, this patient's impairments in swallowing, speech and cognition may be consistent with neurodegenerative etiology. Pt reports relatively simultaneous onset of these deficits beginning 1.5-2 years ago. Speech is characterized by imprecise consonants, slow rate with intermittent breaks in phonation, irregular articulatory breakdowns, telescoping, intermittent strained/strangled vocal quality, slow and irregular alternating motion rates. Sustained /s/z/ ratio 2.2, sustained /z/ noted with pitch breaks. Oral motor examination is unremarkable, however pt observed to have reduced management of saliva and lingual incoordination during  speech sound tasks. Pt, wife and caregiver report he has been following swallowing precautions (recommended at time of MBS with [facility]) and has been tolerating regular diet with thin liquids with chin tuck. Given pt's intelligibility, which is moderately reduced in conversation for this trained listener, history of dysphagia with silent aspiration and complaints of short term memory problems, recommend skilled ST to address these deficits in order to improve intelligibility, reduce risks for aspiration and to improve quality of life. Cognitive deficits to be assessed in next 1-2 sessions.    Speech Therapy Frequency 1x /week   Duration --  8 weeks or 9 visits   Treatment/Interventions Aspiration precaution training;Cognitive reorganization;SLP instruction and feedback;Oral motor exercises;Diet toleration management by SLP;Patient/family education;Functional tasks;Pharyngeal strengthening exercises;Environmental controls;Compensatory strategies   Potential to Achieve Goals Good   SLP Home Exercise Plan speech tasks for dysarthria   Consulted and Agree with Plan of Care Patient;Family member/caregiver      Patient will benefit from skilled therapeutic intervention in order to improve the following deficits and impairments:   Dysarthria and anarthria  Dysphagia, unspecified type  Cognitive communication deficit      G-Codes - 07/08/16 0018    Functional Assessment Tool Used NOMS   Functional Limitations Motor speech   Motor Speech Current Status [phone]([id number]) At least 40 percent but less than 60 percent impaired, limited or restricted   Motor Speech Goal Status ([id number]) At least 20 percent but less than 40 percent impaired, limited or restricted      Problem List Patient Active Problem List   Diagnosis Date Noted   Delayed gastric emptying 04/22/2016   Respiratory failure (HCC) 04/16/2016   SOB (shortness of breath)    NSVT (nonsustained ventricular tachycardia) (HCC)     Elevated troponin 04/15/2016   Acute on chronic diastolic heart failure (HCC)    Gastric dysmotility    Gastrojejunostomy tube 02/06/2016 03/10/2016   Hyponatremia    Anxiety and depression    Sleep disturbance    Orthostasis    Leukocytosis    Hyperglycemia  FTT (failure to thrive) in adult    Anemia of chronic disease 01/31/2016   Stage 2 skin ulcer of sacral region (HCC) 01/31/2016   Hypomagnesemia 01/21/2016   Hypokalemia 01/21/2016   Pressure injury of skin 01/20/2016   Pleural effusion    Gastroesophageal reflux disease 01/19/2016   Insomnia 01/19/2016   Skin ulcer of abdominal wall, limited to breakdown of skin (HCC) 01/17/2016   Dysphagia 01/17/2016   Debility 01/17/2016   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   Vitamin B12 deficiency 01/02/2016   BPH (benign prostatic hyperplasia)    Posterior vitreous detachment    Salzmann's nodular dystrophy of left eye    Hyperopia of both eyes with astigmatism and presbyopia    Central pterygium of right eye    Pressure ulcer of buttock, right, unstageable (HCC) 12/30/2015   Hypernatremia 12/28/2015   Tobacco abuse 12/09/2015   Gastritis 12/09/2015   Respiratory failure, acute (HCC)    Protein-calorie malnutrition, severe 12/04/2015   Encephalopathy acute 12/03/2015   Anxiety state 09/28/2015   Hyperlipidemia    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 09/25/2015 09/25/2015   Coronary atherosclerosis of native coronary artery 04/24/2013   Essential hypertension, benign 04/24/2013   Other and unspecified hyperlipidemia 04/24/2013   Central pterygium 10/30/2012   Far-sighted 10/30/2012   Dystrophy, Salzmann's nodular 10/30/2012   Cataract, nuclear sclerotic senile 10/30/2012   Rondel Baton, MS, CCC-SLP Speech-Language Pathologist  [facility] [address] [place], [place],  [address] Phone: [phone]   Fax:  [phone]  Name: [name] MRN: [id number] Date of Birth: May 09, 1940

## 2016-07-14 ENCOUNTER — Ambulatory Visit: Payer: Medicare Other | Admitting: Physical Therapy

## 2016-07-14 ENCOUNTER — Encounter: Payer: Medicare Other | Admitting: Speech Pathology

## 2016-07-21 ENCOUNTER — Ambulatory Visit: Payer: Medicare Other | Admitting: Speech Pathology

## 2016-07-21 ENCOUNTER — Ambulatory Visit: Payer: Medicare Other | Attending: Internal Medicine | Admitting: Physical Therapy

## 2016-07-21 ENCOUNTER — Encounter: Payer: Self-pay | Admitting: Physical Therapy

## 2016-07-21 DIAGNOSIS — R41841 Cognitive communication deficit: Secondary | ICD-10-CM

## 2016-07-21 DIAGNOSIS — R471 Dysarthria and anarthria: Secondary | ICD-10-CM | POA: Insufficient documentation

## 2016-07-21 DIAGNOSIS — R1310 Dysphagia, unspecified: Secondary | ICD-10-CM | POA: Diagnosis present

## 2016-07-21 DIAGNOSIS — R2681 Unsteadiness on feet: Secondary | ICD-10-CM | POA: Insufficient documentation

## 2016-07-21 DIAGNOSIS — M6281 Muscle weakness (generalized): Secondary | ICD-10-CM | POA: Diagnosis present

## 2016-07-21 DIAGNOSIS — R2689 Other abnormalities of gait and mobility: Secondary | ICD-10-CM | POA: Insufficient documentation

## 2016-07-21 NOTE — Therapy (Signed)
[facility] [address] [place], [place], [address] Phone: [phone]   Fax:  [phone]  Speech Language Pathology Treatment  Patient Details  Name: [name] MRN: [id number] Date of Birth: [birth year] Referring Provider: [name], MD  Encounter Date: 07/21/2016      End of Session - 07/21/16 1734    Visit Number 2   Number of Visits 9   Date for SLP Re-Evaluation 09/02/16   SLP Start Time 1538   SLP Stop Time  1615   SLP Time Calculation (min) 37 min   Activity Tolerance Patient tolerated treatment well      Past Medical History:  Diagnosis Date  . 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 Aspiration pneumonia (HCC) 01/17/2016  . Bilateral dry eyes   . Bile peritonitis due to gastric tube dislodgement 12/03/2015  . BPH (benign prostatic hyperplasia)   . Central pterygium of right eye   . Chronic respiratory failure with hypoxia (HCC)   . Coronary artery disease 1997, 2009   bare mental stent right coronart artery, occlusive followed by Dr. Garnette Scheuermann in [place]  . Degenerative disc disease    LOWER BACK  . Dysphonia   . ED (erectile dysfunction)   . 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 Gastric outlet obstruction 11/24/2015  . Gastroesophageal reflux disease   . Hyperlipidemia   . Hyperopia of both eyes with astigmatism and presbyopia   . Hypertension   . Ischemic right index finger at tip 12/09/2015  . Nuclear senile cataract    Dr. [facility] in [place]  . Peptic ulcer   . Pneumatosis of intestines 12/11/2015  . Pneumonia    HISTORY OF IN CHILDHOOD  . Posterior vitreous detachment 10/30/2012  . Pyloric stricture 11/24/2015  . Salzmann's nodular dystrophy of left eye   . SBO (small bowel obstruction) (HCC) 01/20/2016  . Urinary hesitancy   . Wears glasses     Past Surgical History:   Procedure Laterality Date  . CARDIAC CATHETERIZATION    . CHOLECYSTECTOMY    . ESOPHAGOGASTRODUODENOSCOPY N/A 12/01/2015   Procedure: ESOPHAGOGASTRODUODENOSCOPY (EGD) BALLOON DILATION OF DUODENAL STRICTURE;  Surgeon: Karie Soda, MD;  Location: [facility];  Service: General;  Laterality: N/A;  . ESOPHAGOGASTRODUODENOSCOPY N/A 12/14/2015   Procedure: ESOPHAGOGASTRODUODENOSCOPY (EGD);  Surgeon: Vida Rigger, MD;  Location: [facility];  Service: Endoscopy;  Laterality: N/A;  Patient has tracheostomy  . ESOPHAGOGASTRODUODENOSCOPY (EGD) WITH PROPOFOL N/A 07/01/2015   Procedure: ESOPHAGOGASTRODUODENOSCOPY (EGD) WITH PROPOFOL;  Surgeon: Willis Modena, MD;  Location: [facility];  Service: Endoscopy;  Laterality: N/A;  . ESOPHAGOGASTRODUODENOSCOPY (EGD) WITH PROPOFOL N/A 11/23/2015   Procedure: ESOPHAGOGASTRODUODENOSCOPY (EGD) WITH PROPOFOL;  Surgeon: Willis Modena, MD;  Location: [facility];  Service: Endoscopy;  Laterality: N/A;  may need to intubate  . EYE SURGERY     growth on right eye removed   . IR GENERIC HISTORICAL  12/13/2015   IR REPLC DUODEN/JEJUNO TUBE PERCUT W/FLUORO 12/13/2015 [name], MD [facility]  . IR GENERIC HISTORICAL  02/03/2016   IR REPLC GASTRO/COLONIC TUBE PERCUT W/FLUORO 02/03/2016 Berdine Dance, MD [facility]  . IR GENERIC HISTORICAL  02/04/2016   IR GASTR TUBE CONVERT GASTR-JEJ PER W/FL MOD SED 02/04/2016 Malachy Moan, MD [facility]  . IR GENERIC HISTORICAL  02/25/2016   IR SINUS/FIST TUBE CHK-NON GI 02/25/2016 Irish Lack, MD [facility]  . IR GENERIC HISTORICAL  03/18/2016   IR CM  INJ ANY COLONIC TUBE W/FLUORO 03/18/2016 Simonne Come, MD [facility]  . IR GENERIC HISTORICAL  03/18/2016   IR GJ TUBE CHANGE 03/18/2016 Simonne Come, MD [facility]  . IR GENERIC HISTORICAL  03/22/2016   IR CM INJ ANY COLONIC TUBE W/FLUORO 03/22/2016 Simonne Come, MD [facility]  . IR GENERIC HISTORICAL  04/13/2016   IR CM INJ ANY COLONIC TUBE W/FLUORO 04/13/2016 Richarda Overlie, MD  [facility]  . LAPAROSCOPIC GASTROSTOMY N/A 12/01/2015   Procedure: LAPAROSCOPIC PLACEMENT OF FEEDING JEJUNOSTOMY AND GASTROSTOMY TUBE;  Surgeon: Karie Soda, MD;  Location: [facility];  Service: General;  Laterality: N/A;  . LAPAROSCOPY N/A 12/03/2015   Procedure: LAPAROSCOPY DIAGNOSTIC, OMENTOPEXY, JEJUNOSTOMY, WASH OUT;  Surgeon: Karie Soda, MD;  Location: [facility];  Service: General;  Laterality: N/A;  . LAPAROTOMY N/A 02/06/2016   Procedure: EXPLORATORY LAPAROTOMY, LYSIS OF ADHESIONS, SEGMENTAL SMALL BOWEL RESECTION, GASTROJEJUNOSTOMY;  Surgeon: Chevis Pretty III, MD;  Location: [facility];  Service: General;  Laterality: N/A;  . STENT TO HEART     2 1997 AND 1 IN 1996  . TONSILLECTOMY    . XI ROBOTIC VAGOTOMY AND ANTRECTOMY N/A 09/25/2015   Procedure: XI ROBOTIC ANTERIOR AND POSTERIOR VAGOTOMY, BILROTH I  ANASTOMOSIS DOR FUNDIPLICATION, OMENTOPEXY  UPPER ENDOSCOPY;  Surgeon: Karie Soda, MD;  Location: [facility];  Service: General;  Laterality: N/A;    There were no vitals filed for this visit.      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 No/denies               ADULT SLP TREATMENT - 07/21/16 1530      General Information   Behavior/Cognition Alert;Cooperative   Patient Positioning Upright in chair   Oral care provided N/A     Treatment Provided   Treatment provided Cognitive-Linquistic     Pain Assessment   Pain Assessment No/denies pain     Cognitive-Linquistic Treatment   Treatment focused on Cognition   Skilled Treatment Provided education re: results of evaluation, administered MOCA for assessment of cognition.      Assessment / Recommendations / Plan   Plan Continue with current plan of care;Goals updated     Progression Toward Goals   Progression toward goals Progressing toward goals          SLP Education - 07/21/16 1733    Education provided Yes   Education Details  Cognitive activities for home, attention and memory strategies, use of a planner   Person(s) Educated Patient;Spouse   Methods Explanation;Handout   Comprehension Verbalized understanding;Need further instruction          SLP Short Term Goals - 07/21/16 1741      SLP SHORT TERM GOAL #1   Title pt will complete HEP for dysarthria with rare min A over three sessions   Time 4   Period Weeks   Status On-going     SLP SHORT TERM GOAL #2   Title pt will use compensatory strategies for dysarthria 80% of the time in sentence level responses   Time 4   Period Weeks   Status On-going     SLP SHORT TERM GOAL #3   Title Pt will participate in assessment of cognitive communication within first 2 sessions.   Time 4   Period Weeks   Status Achieved     SLP SHORT TERM GOAL #4   Title  Pt will tell SLP 4 strategies to improve attention, memory.    Time 4   Period Weeks   Status New          SLP Long Term Goals - 07/21/16 1742      SLP LONG TERM GOAL #1   Title pt will demo HEP with modified independence over two sessions   Time 8   Period Weeks   Status On-going     SLP LONG TERM GOAL #2   Title Pt will use compensatory strategies for dysarthria to improve intelligiblity to 75% over 5 minutes mod complex conversation.   Time 8   Period Weeks   Status On-going     SLP LONG TERM GOAL #3   Title pt will tell SLP 3 s/s aspiration PNA with modified independence   Time 8   Period Weeks   Status On-going     SLP LONG TERM GOAL #4   Title Pt will report demo use of compensatory strategies for recall of daily activities, appointments or details from written materials in 2 therapy sessions.   Time 8   Period Weeks   Status New          Plan - 07/21/16 1735    Clinical Impression Statement Patient presents cognitive deficits in attention, memory in addition to moderate dysarthria, sensorimotor-based dysphagia(per recent MBS). Administered MOCA form 7.3; pt scored 18/30 (>26  is within normal limits). Pt with difficulty storing words (4/5 immediate recall), delayed recall (0/5), digit repetition (1/2). He reports difficulty recalling the date; today is oriented to year but not month or day. Pt enjoys reading but has had difficulty lately, states he is unable to recall what he reads. Recommend skilled ST to address deficits in order to improve intelligibility, maximize cognitive function, reduce risks for aspiration and to improve quality of life.    Speech Therapy Frequency 1x /week   Treatment/Interventions Aspiration precaution training;Cognitive reorganization;SLP instruction and feedback;Oral motor exercises;Diet toleration management by SLP;Patient/family education;Functional tasks;Pharyngeal strengthening exercises;Environmental controls;Compensatory strategies   Potential to Achieve Goals Good   SLP Home Exercise Plan speech tasks for dysarthria, cognitive home program   Consulted and Agree with Plan of Care Patient;Family member/caregiver      Patient will benefit from skilled therapeutic intervention in order to improve the following deficits and impairments:   Cognitive communication deficit  Dysarthria and anarthria  Dysphagia, unspecified type    Problem List Patient Active Problem List   Diagnosis Date Noted  . Delayed gastric emptying 04/22/2016  . Respiratory failure (HCC) 04/16/2016  . SOB (shortness of breath)   . NSVT (nonsustained ventricular tachycardia) (HCC)   . Elevated troponin 04/15/2016  . Acute on chronic diastolic heart failure (HCC)   . Gastric dysmotility   . Gastrojejunostomy tube 02/06/2016 03/10/2016  . Hyponatremia   . Anxiety and depression   . Sleep disturbance   . Orthostasis   . Leukocytosis   . Hyperglycemia   . FTT (failure to thrive) in adult   . Anemia of chronic disease 01/31/2016  . Stage 2 skin ulcer of sacral region (HCC) 01/31/2016  . Hypomagnesemia 01/21/2016  . Hypokalemia 01/21/2016  . Pressure  injury of skin 01/20/2016  . Pleural effusion   . Gastroesophageal reflux disease 01/19/2016  . Insomnia 01/19/2016  . Skin ulcer of abdominal wall, limited to breakdown of skin (HCC) 01/17/2016  . Dysphagia 01/17/2016  . Debility 01/17/2016  . 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 Vitamin B12 deficiency 01/02/2016  .  BPH (benign prostatic hyperplasia)   . Posterior vitreous detachment   . Salzmann's nodular dystrophy of left eye   . Hyperopia of both eyes with astigmatism and presbyopia   . Central pterygium of right eye   . Pressure ulcer of buttock, right, unstageable (HCC) 12/30/2015  . Hypernatremia 12/28/2015  . Tobacco abuse 12/09/2015  . Gastritis 12/09/2015  . Respiratory failure, acute (HCC)   . Protein-calorie malnutrition, severe 12/04/2015  . Encephalopathy acute 12/03/2015  . Anxiety state 09/28/2015  . Hyperlipidemia   . 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 Coronary atherosclerosis of native coronary artery 04/24/2013  . Essential hypertension, benign 04/24/2013  . Other and unspecified hyperlipidemia 04/24/2013  . Central pterygium 10/30/2012  . 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 Cataract, nuclear sclerotic senile 10/30/2012   Rondel BatonMary Beth Council Munguia, MS, CCC-SLP Speech-Language Pathologist  Arlana LindauMary E Bynum Mccullars 07/21/2016, 6:00 PM  [facility] [address] [place], [place], [address] Phone: [phone]   Fax:  [phone]   Name: [name] MRN: [id number] Date of Birth: 1940/05/14

## 2016-07-21 NOTE — Patient Instructions (Addendum)
Functional Quadriceps: Sit to Stand    Sit on edge of chair, feet flat on floor. Stand upright, extending knees fully. Use hands on knees as needed. Repeat 10 times per set. Do _1_ sets per session. Do _1-2_ sessions per day.  http://orth.exer.us/734   Copyright  VHI. All rights reserved.   Perform these at the counter top for balance assist as needed:  .  "I love a Parade" Lift   At counter for balance as needed: high knee marching forward and then backward. 3 second pauses with each knee lift.  Repeat 3 laps each way. Do _1-2_ sessions per day. http://gt2.exer.us/345   Copyright  VHI. All rights reserved.  Feet Heel-Toe "Tandem"   At counter: Arms at sides, walk a straight line forward bringing one foot directly in front of the other, and then a straight line backwards bringing one foot directly behind the other one.  Repeat for _3 laps each way. Do _1-2_ sessions per day.  Copyright  VHI. All rights reserved.

## 2016-07-23 NOTE — Therapy (Signed)
[facility] [address] [place], [place], [address] Phone: [phone]   Fax:  [phone]  Physical Therapy Treatment  Patient Details  Name: [name] MRN: [id number] Date of Birth: Jul 12, 1940 Referring Provider: [name], MD  Encounter Date: 07/21/2016     07/21/16 1455  PT Visits / Re-Eval  Visit Number 2  Number of Visits 9  Date for PT Re-Evaluation 09/05/16  Authorization  Authorization Type [employer] MCR-G Code on every 10th visit  PT Time Calculation  PT Start Time 1450  PT Stop Time 1530  PT Time Calculation (min) 40 min  PT - End of Session  Equipment Utilized During Treatment Gait belt  Activity Tolerance Patient tolerated treatment well  Behavior During Therapy [facility] for tasks assessed/performed    Past Medical History:  Diagnosis Date  . 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 Aspiration pneumonia (HCC) 01/17/2016  . Bilateral dry eyes   . Bile peritonitis due to gastric tube dislodgement 12/03/2015  . BPH (benign prostatic hyperplasia)   . Central pterygium of right eye   . Chronic respiratory failure with hypoxia (HCC)   . Coronary artery disease 1997, 2009   bare mental stent right coronart artery, occlusive followed by Dr. Garnette Scheuermann in [place]  . Degenerative disc disease    LOWER BACK  . Dysphonia   . ED (erectile dysfunction)   . 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 Gastric outlet obstruction 11/24/2015  . Gastroesophageal reflux disease   . Hyperlipidemia   . Hyperopia of both eyes with astigmatism and presbyopia   . Hypertension   . Ischemic right index finger at tip 12/09/2015  . Nuclear senile cataract    Dr. [facility] in [place]  . Peptic ulcer   . Pneumatosis of intestines 12/11/2015  . Pneumonia    HISTORY OF IN CHILDHOOD  . Posterior vitreous detachment 10/30/2012    . Pyloric stricture 11/24/2015  . Salzmann's nodular dystrophy of left eye   . SBO (small bowel obstruction) (HCC) 01/20/2016  . Urinary hesitancy   . Wears glasses     Past Surgical History:  Procedure Laterality Date  . CARDIAC CATHETERIZATION    . CHOLECYSTECTOMY    . ESOPHAGOGASTRODUODENOSCOPY N/A 12/01/2015   Procedure: ESOPHAGOGASTRODUODENOSCOPY (EGD) BALLOON DILATION OF DUODENAL STRICTURE;  Surgeon: Karie Soda, MD;  Location: [facility];  Service: General;  Laterality: N/A;  . ESOPHAGOGASTRODUODENOSCOPY N/A 12/14/2015   Procedure: ESOPHAGOGASTRODUODENOSCOPY (EGD);  Surgeon: Vida Rigger, MD;  Location: [facility];  Service: Endoscopy;  Laterality: N/A;  Patient has tracheostomy  . ESOPHAGOGASTRODUODENOSCOPY (EGD) WITH PROPOFOL N/A 07/01/2015   Procedure: ESOPHAGOGASTRODUODENOSCOPY (EGD) WITH PROPOFOL;  Surgeon: Willis Modena, MD;  Location: [facility];  Service: Endoscopy;  Laterality: N/A;  . ESOPHAGOGASTRODUODENOSCOPY (EGD) WITH PROPOFOL N/A 11/23/2015   Procedure: ESOPHAGOGASTRODUODENOSCOPY (EGD) WITH PROPOFOL;  Surgeon: Willis Modena, MD;  Location: [facility];  Service: Endoscopy;  Laterality: N/A;  may need to intubate  . EYE SURGERY     growth on right eye removed   . IR GENERIC HISTORICAL  12/13/2015   IR REPLC DUODEN/JEJUNO TUBE PERCUT W/FLUORO 12/13/2015 [name], MD [facility]  . IR GENERIC HISTORICAL  02/03/2016   IR REPLC GASTRO/COLONIC TUBE PERCUT W/FLUORO 02/03/2016 Berdine Dance, MD [facility]  . IR GENERIC HISTORICAL  02/04/2016   IR GASTR TUBE CONVERT GASTR-JEJ PER W/FL MOD SED 02/04/2016 Malachy Moan, MD [facility]  . IR GENERIC HISTORICAL  02/25/2016   IR SINUS/FIST TUBE CHK-NON GI 02/25/2016 Irish Lack, MD [facility]  . IR GENERIC HISTORICAL  03/18/2016   IR CM INJ ANY COLONIC TUBE W/FLUORO 03/18/2016 Simonne Come, MD [facility]  . IR GENERIC HISTORICAL  03/18/2016   IR GJ TUBE CHANGE 03/18/2016 Simonne Come, MD [facility]  . IR GENERIC  HISTORICAL  03/22/2016   IR CM INJ ANY COLONIC TUBE W/FLUORO 03/22/2016 Simonne Come, MD [facility]  . IR GENERIC HISTORICAL  04/13/2016   IR CM INJ ANY COLONIC TUBE W/FLUORO 04/13/2016 Richarda Overlie, MD [facility]  . LAPAROSCOPIC GASTROSTOMY N/A 12/01/2015   Procedure: LAPAROSCOPIC PLACEMENT OF FEEDING JEJUNOSTOMY AND GASTROSTOMY TUBE;  Surgeon: Karie Soda, MD;  Location: [facility];  Service: General;  Laterality: N/A;  . LAPAROSCOPY N/A 12/03/2015   Procedure: LAPAROSCOPY DIAGNOSTIC, OMENTOPEXY, JEJUNOSTOMY, WASH OUT;  Surgeon: Karie Soda, MD;  Location: [facility];  Service: General;  Laterality: N/A;  . LAPAROTOMY N/A 02/06/2016   Procedure: EXPLORATORY LAPAROTOMY, LYSIS OF ADHESIONS, SEGMENTAL SMALL BOWEL RESECTION, GASTROJEJUNOSTOMY;  Surgeon: Chevis Pretty III, MD;  Location: [facility];  Service: General;  Laterality: N/A;  . STENT TO HEART     2 1997 AND 1 IN 1996  . TONSILLECTOMY    . XI ROBOTIC VAGOTOMY AND ANTRECTOMY N/A 09/25/2015   Procedure: XI ROBOTIC ANTERIOR AND POSTERIOR VAGOTOMY, BILROTH I  ANASTOMOSIS DOR FUNDIPLICATION, OMENTOPEXY  UPPER ENDOSCOPY;  Surgeon: Karie Soda, MD;  Location: [facility];  Service: General;  Laterality: N/A;    There were no vitals filed for this visit.   07/21/16 1454  Symptoms/Limitations  Subjective No new complaints. No new falls or any pain to report.  Patient is accompained by: Carney Bern, spouse)  Pertinent History History of several GI surgeries, dementia, swallowing/speech difficulties  Limitations House hold activities;Walking;Standing  Patient Stated Goals "I want to be able to walk."   Pain Assessment  Currently in Pain? No/denies      07/21/16 1458  6 Minute Walk- Baseline  6 Minute Walk- Baseline y  BP (mmHg) 144/62  HR (bpm) 70  02 Sat (%RA) 98 %  Modified Borg Scale for Dyspnea 0- Nothing at all  Perceived Rate of Exertion (Borg) 6-  6 Minute walk- Post Test  6 Minute Walk Post Test y  BP (mmHg) 140/64  HR (bpm) 107  02 Sat (%RA) 97 %    Modified Borg Scale for Dyspnea 2- Mild shortness of breath  Perceived Rate of Exertion (Borg) 12-  6 minute walk test results   Aerobic Endurance Distance Walked 662  Endurance additional comments with cane assist, no balance issues noted   issued the following to pt's HEP today: Functional Quadriceps: Sit to Stand    Sit on edge of chair, feet flat on floor. Stand upright, extending knees fully. Use hands on knees as needed. Repeat 10 times per set. Do _1_ sets per session. Do _1-2_ sessions per day.  http://orth.exer.us/734   Copyright  VHI. All rights reserved.   Perform these at the counter top for balance assist as needed:  .  "I love a Parade" Lift   At counter for balance as needed: high knee marching forward and then backward. 3 second pauses with each knee lift.  Repeat 3 laps each way. Do _1-2_ sessions per day. http://gt2.exer.us/345   Copyright  VHI. All rights reserved.  Feet Heel-Toe "Tandem"   At counter: Arms at sides, walk a straight line forward bringing one foot directly in front of the  other, and then a straight line backwards bringing one foot directly behind the other one.  Repeat for _3 laps each way. Do _1-2_ sessions per day.  Copyright  VHI. All rights reserved.      07/21/16 1630  PT Education  Education provided Yes  Education Details results of 6 minute walk test;HEP for home  Person(s) Educated Patient;Spouse  Methods Explanation;Demonstration;Verbal cues;Handout  Comprehension Verbalized understanding;Returned demonstration;Verbal cues required         PT Short Term Goals - 07/07/16 1909      PT SHORT TERM GOAL #1   Title Pt/family/caregiver will be independent with initial HEP in order to indicate improved functional mobility and decreased fall risk.  (Target Date: 08/06/16)   Status New     PT SHORT TERM GOAL #2   Title Pt will improve BERG balance score to 41/56 in order to indicate decreased fall risk.     Status New      PT SHORT TERM GOAL #3   Title Pt will perform 5TSS in <17 secs with single UE support in order to indicate improved fuctional strength and decreased fall risk.     Status New     PT SHORT TERM GOAL #4   Title Pt will improve gait speed to 2.21 ft/sec w/ LRAD in order to indicate improved efficiency of gait and decreased fall risk.     Status New     PT SHORT TERM GOAL #5   Title Will assess 6MWT and improve distance by 4675' from baseline in order to indicate improved functional endurance.     Status New           PT Long Term Goals - 07/07/16 1913      PT LONG TERM GOAL #1   Title Pt/wife/caregiver will be independent with final HEP in order to indicate continued fitness and decreased fall risk.  (Target Date: 09/05/16)   Status New     PT LONG TERM GOAL #2   Title Pt will improve BERG balance score to 44/56 in order to indicate decreased fall risk.     Status New     PT LONG TERM GOAL #3   Title Pt will perform 5TSS in </=15 secs without UE support in order to indicate decreased fall risk and improved functional strength.     Status New     PT LONG TERM GOAL #4   Title Pt will improve gait speed to >/=2.62 w/ LRAD in order to indicate improved efficiency of gait and decreased fall risk.     Status New     PT LONG TERM GOAL #5   Title Pt will ambulate up to 500' over unlevel paved outdoor surfaces w/ LRAD at S level in order to indicate return to community negotiation.     Status New     Additional Long Term Goals   Additional Long Term Goals Yes     PT LONG TERM GOAL #6   Title Pt will improve 6MWT by 150' from baseline in order to indicate improved functional endurance.     Status New        07/21/16 1455  Plan  Clinical Impression Statement Today's skilled session established base line values for the 6 minute walk test. (FYI- Pt has A-fib and the machine does not register his BP. Must do it manually). Remainder of session address initiation of HEP for home. Pt is  progressign toward goals and should benefit from continued PT to progress  toward unmet goals.                          Pt will benefit from skilled therapeutic intervention in order to improve on the following deficits Abnormal gait;Decreased activity tolerance;Decreased balance;Decreased cognition;Decreased endurance;Decreased knowledge of use of DME;Decreased mobility;Decreased range of motion;Decreased strength;Impaired perceived functional ability;Impaired flexibility;Postural dysfunction  Rehab Potential Good  PT Frequency 1x / week (due to high copay, would like to see 2x/wk)  PT Duration 8 weeks  PT Treatment/Interventions ADLs/Self Care Home Management;Electrical Stimulation;DME Instruction;Gait training;Stair training;Functional mobility training;Therapeutic activities;Therapeutic exercise;Balance training;Neuromuscular re-education;Patient/family education;Orthotic Fit/Training;Passive range of motion;Vestibular  PT Next Visit Plan work on gait speed with improved quality of gait, increased stride length;continue to work on balance. advance/add to HEP as needed  Consulted and Agree with Plan of Care Patient;Family member/caregiver  Family Member Consulted wife and caregiver     Patient will benefit from skilled therapeutic intervention in order to improve the following deficits and impairments:  Abnormal gait, Decreased activity tolerance, Decreased balance, Decreased cognition, Decreased endurance, Decreased knowledge of use of DME, Decreased mobility, Decreased range of motion, Decreased strength, Impaired perceived functional ability, Impaired flexibility, Postural dysfunction  Visit Diagnosis: Unsteadiness on feet  Muscle weakness (generalized)  Other abnormalities of gait and mobility     Problem List Patient Active Problem List   Diagnosis Date Noted  . Delayed gastric emptying 04/22/2016  . Respiratory failure (HCC) 04/16/2016  . SOB (shortness of breath)   . NSVT  (nonsustained ventricular tachycardia) (HCC)   . Elevated troponin 04/15/2016  . Acute on chronic diastolic heart failure (HCC)   . Gastric dysmotility   . Gastrojejunostomy tube 02/06/2016 03/10/2016  . Hyponatremia   . Anxiety and depression   . Sleep disturbance   . Orthostasis   . Leukocytosis   . Hyperglycemia   . FTT (failure to thrive) in adult   . Anemia of chronic disease 01/31/2016  . Stage 2 skin ulcer of sacral region (HCC) 01/31/2016  . Hypomagnesemia 01/21/2016  . Hypokalemia 01/21/2016  . Pressure injury of skin 01/20/2016  . Pleural effusion   . Gastroesophageal reflux disease 01/19/2016  . Insomnia 01/19/2016  . Skin ulcer of abdominal wall, limited to breakdown of skin (HCC) 01/17/2016  . Dysphagia 01/17/2016  . Debility 01/17/2016  . 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 Vitamin B12 deficiency 01/02/2016  . BPH (benign prostatic hyperplasia)   . Posterior vitreous detachment   . Salzmann's nodular dystrophy of left eye   . Hyperopia of both eyes with astigmatism and presbyopia   . Central pterygium of right eye   . Pressure ulcer of buttock, right, unstageable (HCC) 12/30/2015  . Hypernatremia 12/28/2015  . Tobacco abuse 12/09/2015  . Gastritis 12/09/2015  . Respiratory failure, acute (HCC)   . Protein-calorie malnutrition, severe 12/04/2015  . Encephalopathy acute 12/03/2015  . Anxiety state 09/28/2015  . Hyperlipidemia   . 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 Coronary atherosclerosis of native coronary artery 04/24/2013  . Essential hypertension, benign 04/24/2013  . Other and unspecified hyperlipidemia 04/24/2013  . Central pterygium 10/30/2012  . 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 Cataract, nuclear sclerotic senile 10/30/2012    Sallyanne Kuster, PTA, [facility] [address], [address] [place], [place]  [address] [phone] 07/23/16, 2:00 PM   Name: [name] MRN: [id number] Date of Birth: [birth year]

## 2016-07-25 ENCOUNTER — Ambulatory Visit: Payer: Medicare Other | Admitting: Rehabilitation

## 2016-07-25 ENCOUNTER — Ambulatory Visit: Payer: Medicare Other | Admitting: Speech Pathology

## 2016-07-25 ENCOUNTER — Encounter: Payer: Self-pay | Admitting: Rehabilitation

## 2016-07-25 DIAGNOSIS — R2681 Unsteadiness on feet: Secondary | ICD-10-CM | POA: Diagnosis not present

## 2016-07-25 DIAGNOSIS — R1310 Dysphagia, unspecified: Secondary | ICD-10-CM

## 2016-07-25 DIAGNOSIS — R471 Dysarthria and anarthria: Secondary | ICD-10-CM

## 2016-07-25 DIAGNOSIS — M6281 Muscle weakness (generalized): Secondary | ICD-10-CM

## 2016-07-25 DIAGNOSIS — R2689 Other abnormalities of gait and mobility: Secondary | ICD-10-CM

## 2016-07-25 DIAGNOSIS — R41841 Cognitive communication deficit: Secondary | ICD-10-CM

## 2016-07-25 NOTE — Patient Instructions (Signed)
Functional Quadriceps: Sit to Stand    Sit on edge of chair, feet flat on floor. Stand upright, extending knees fully. Use hands on knees as needed.  Once you are standing, stretch arms out by your side to stretch your chest and stand as tall as you can.  Repeat 10 times per set. Do _1_ sets per session. Do _1-2_ sessions per day.  http://orth.exer.us/734   Copyright  VHI. All rights reserved.   Perform these at the counter top for balance assist as needed:  .  "I love a Parade" Lift   At counter for balance as needed: high knee marching forward and then backward. 3 second pauses with each knee lift.  Repeat 3 laps each way. Do _1-2_ sessions per day. http://gt2.exer.us/345   Copyright  VHI. All rights reserved.  Feet Heel-Toe "Tandem"   At counter: Arms at sides, walk a straight line forward bringing one foot directly in front of the other, and then a straight line backwards bringing one foot directly behind the other one.  Repeat for _3 laps each way. Do _1-2_ sessions per day.

## 2016-07-25 NOTE — Therapy (Signed)
[facility] [address] [place], [place], [address] Phone: [phone]   Fax:  [phone]  Physical Therapy Treatment  Patient Details  Name: [name] MRN: [id number] Date of Birth: 18-Dec-1940 Referring Provider: [name], MD  Encounter Date: 07/25/2016      PT End of Session - 07/25/16 1940    Visit Number 3   Number of Visits 9   Date for PT Re-Evaluation 09/05/16   Authorization Type [employer] MCR-G Code on every 10th visit   PT Start Time 1532   PT Stop Time 1615   PT Time Calculation (min) 43 min   Equipment Utilized During Treatment Gait belt   Activity Tolerance Patient tolerated treatment well   Behavior During Therapy [facility] for tasks assessed/performed      Past Medical History:  Diagnosis Date  . 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 Aspiration pneumonia (HCC) 01/17/2016  . Bilateral dry eyes   . Bile peritonitis due to gastric tube dislodgement 12/03/2015  . BPH (benign prostatic hyperplasia)   . Central pterygium of right eye   . Chronic respiratory failure with hypoxia (HCC)   . Coronary artery disease 1997, 2009   bare mental stent right coronart artery, occlusive followed by Dr. Garnette Scheuermann in [place]  . Degenerative disc disease    LOWER BACK  . Dysphonia   . ED (erectile dysfunction)   . 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 Gastric outlet obstruction 11/24/2015  . Gastroesophageal reflux disease   . Hyperlipidemia   . Hyperopia of both eyes with astigmatism and presbyopia   . Hypertension   . Ischemic right index finger at tip 12/09/2015  . Nuclear senile cataract    Dr. [facility] in [place]  . Peptic ulcer   . Pneumatosis of intestines 12/11/2015  . Pneumonia    HISTORY OF IN CHILDHOOD  . Posterior vitreous detachment 10/30/2012  . Pyloric stricture 11/24/2015  . Salzmann's  nodular dystrophy of left eye   . SBO (small bowel obstruction) (HCC) 01/20/2016  . Urinary hesitancy   . Wears glasses     Past Surgical History:  Procedure Laterality Date  . CARDIAC CATHETERIZATION    . CHOLECYSTECTOMY    . ESOPHAGOGASTRODUODENOSCOPY N/A 12/01/2015   Procedure: ESOPHAGOGASTRODUODENOSCOPY (EGD) BALLOON DILATION OF DUODENAL STRICTURE;  Surgeon: Karie Soda, MD;  Location: [facility];  Service: General;  Laterality: N/A;  . ESOPHAGOGASTRODUODENOSCOPY N/A 12/14/2015   Procedure: ESOPHAGOGASTRODUODENOSCOPY (EGD);  Surgeon: Vida Rigger, MD;  Location: [facility];  Service: Endoscopy;  Laterality: N/A;  Patient has tracheostomy  . ESOPHAGOGASTRODUODENOSCOPY (EGD) WITH PROPOFOL N/A 07/01/2015   Procedure: ESOPHAGOGASTRODUODENOSCOPY (EGD) WITH PROPOFOL;  Surgeon: Willis Modena, MD;  Location: [facility];  Service: Endoscopy;  Laterality: N/A;  . ESOPHAGOGASTRODUODENOSCOPY (EGD) WITH PROPOFOL N/A 11/23/2015   Procedure: ESOPHAGOGASTRODUODENOSCOPY (EGD) WITH PROPOFOL;  Surgeon: Willis Modena, MD;  [facility];  Service: Endoscopy;  Laterality: N/A;  may need to intubate  . EYE SURGERY     growth on right eye removed   . IR GENERIC HISTORICAL  12/13/2015   IR REPLC DUODEN/JEJUNO TUBE PERCUT W/FLUORO 12/13/2015 [name], MD [facility]  . IR GENERIC HISTORICAL  02/03/2016   IR REPLC GASTRO/COLONIC TUBE PERCUT W/FLUORO 02/03/2016 Berdine Dance, MD [facility]  . IR GENERIC HISTORICAL  02/04/2016   IR GASTR TUBE CONVERT GASTR-JEJ PER W/FL MOD SED 02/04/2016 Malachy Moan, MD [facility]  . IR GENERIC  HISTORICAL  02/25/2016   IR SINUS/FIST TUBE CHK-NON GI 02/25/2016 Irish Lack, MD [facility]  . IR GENERIC HISTORICAL  03/18/2016   IR CM INJ ANY COLONIC TUBE W/FLUORO 03/18/2016 Simonne Come, MD [facility]  . IR GENERIC HISTORICAL  03/18/2016   IR GJ TUBE CHANGE 03/18/2016 Simonne Come, MD [facility]  . IR GENERIC HISTORICAL  03/22/2016   IR CM INJ ANY COLONIC  TUBE W/FLUORO 03/22/2016 Simonne Come, MD [facility]  . IR GENERIC HISTORICAL  04/13/2016   IR CM INJ ANY COLONIC TUBE W/FLUORO 04/13/2016 Richarda Overlie, MD [facility]  . LAPAROSCOPIC GASTROSTOMY N/A 12/01/2015   Procedure: LAPAROSCOPIC PLACEMENT OF FEEDING JEJUNOSTOMY AND GASTROSTOMY TUBE;  Surgeon: Karie Soda, MD;  Location: [facility];  Service: General;  Laterality: N/A;  . LAPAROSCOPY N/A 12/03/2015   Procedure: LAPAROSCOPY DIAGNOSTIC, OMENTOPEXY, JEJUNOSTOMY, WASH OUT;  Surgeon: Karie Soda, MD;  Location: [facility];  Service: General;  Laterality: N/A;  . LAPAROTOMY N/A 02/06/2016   Procedure: EXPLORATORY LAPAROTOMY, LYSIS OF ADHESIONS, SEGMENTAL SMALL BOWEL RESECTION, GASTROJEJUNOSTOMY;  Surgeon: Chevis Pretty III, MD;  Location: [facility];  Service: General;  Laterality: N/A;  . STENT TO HEART     2 1997 AND 1 IN 1996  . TONSILLECTOMY    . XI ROBOTIC VAGOTOMY AND ANTRECTOMY N/A 09/25/2015   Procedure: XI ROBOTIC ANTERIOR AND POSTERIOR VAGOTOMY, BILROTH I  ANASTOMOSIS DOR FUNDIPLICATION, OMENTOPEXY  UPPER ENDOSCOPY;  Surgeon: Karie Soda, MD;  Location: [facility];  Service: General;  Laterality: N/A;    There were no vitals filed for this visit.      Subjective Assessment - 07/25/16 1539    Subjective "My throat hurts since this morning on the right side."    Patient is accompained by: Family member  jean   Pertinent History History of several GI surgeries, dementia, swallowing/speech difficulties   Limitations House hold activities;Walking;Standing   Patient Stated Goals "I want to be able to walk."    Currently in Pain? Yes   Pain Score 3    Pain Orientation Right   Pain Descriptors / Indicators Aching   Pain Type Acute pain   Pain Onset Today   Pain Frequency Intermittent   Aggravating Factors  swallowing   Pain Relieving Factors not swallowing                          [facility] Adult PT Treatment/Exercise - 07/25/16 0001      Ambulation/Gait   Ambulation/Gait Yes    Ambulation/Gait Assistance 5: Supervision   Ambulation/Gait Assistance Details Worked on gait with SPC with emphasis on improved stride length, upright posture, increased gait speed and improved heel to toe contact.    Ambulation Distance (Feet) 300 Feet   Assistive device Straight cane   Gait Pattern Step-through pattern;Decreased stride length;Right flexed knee in stance;Left flexed knee in stance;Trunk flexed   Ambulation Surface Level;Indoor     High Level Balance   High Level Balance Comments Went over current HEP at counter top with marching forwards/backwards x 2 reps down and back, walking heel to toe x 2 reps down and back with cues for posture and upright gaze.  Performed alternating cone tapping x 10 reps wihle standing on red therapy mat to encourage SLS with min/guard A progressing to tipping cone over and back upright in order to increase time in SLS.  Cues for slower more controlled movement.  Progressed to forward tapping/stepping over cones sequence x 10  reps (2 sets) with min a and max cues for sequencing.  Maintaining balance on BOSU ball (black top up) x 3 reps of 15 secs (progressed from BUE>single UE>no UE support), performing mini squats on BOSU x 10 reps with BUE support>single UE support>no UE support.  Ended with wall bumps while standing on foam balance beam to improve hip extension and hip strategy.  Performed x 8 reps with 5 sec holds and cues for posture.       Exercises   Exercises Other Exercises   Other Exercises  sit<>stand x 10 reps with cues for scooting to EOM, and once in standing outstretching BUEs to encourage more upright posture.  Added to HEP.                   PT Education - 07/25/16 1540    Education provided Yes   Education Details adding outstretched arms to sit<>stand once standing.    Person(s) Educated Patient;Spouse   Methods Explanation   Comprehension Verbalized understanding          PT Short Term Goals - 07/07/16 1909      PT  SHORT TERM GOAL #1   Title Pt/family/caregiver will be independent with initial HEP in order to indicate improved functional mobility and decreased fall risk.  (Target Date: 08/06/16)   Status New     PT SHORT TERM GOAL #2   Title Pt will improve BERG balance score to 41/56 in order to indicate decreased fall risk.     Status New     PT SHORT TERM GOAL #3   Title Pt will perform 5TSS in <17 secs with single UE support in order to indicate improved fuctional strength and decreased fall risk.     Status New     PT SHORT TERM GOAL #4   Title Pt will improve gait speed to 2.21 ft/sec w/ LRAD in order to indicate improved efficiency of gait and decreased fall risk.     Status New     PT SHORT TERM GOAL #5   Title Will assess and improve distance by 67' from baseline in order to indicate improved functional endurance.     Status New           PT Long Term Goals - 07/07/16 1913      PT LONG TERM GOAL #1   Title Pt/wife/caregiver will be independent with final HEP in order to indicate continued fitness and decreased fall risk.  (Target Date: 09/05/16)   Status New     PT LONG TERM GOAL #2   Title Pt will improve BERG balance score to 44/56 in order to indicate decreased fall risk.     Status New     PT LONG TERM GOAL #3   Title Pt will perform 5TSS in </=15 secs without UE support in order to indicate decreased fall risk and improved functional strength.     Status New     PT LONG TERM GOAL #4   Title Pt will improve gait speed to >/=2.62 w/ LRAD in order to indicate improved efficiency of gait and decreased fall risk.     Status New     PT LONG TERM GOAL #5   Title Pt will ambulate up to 500' over unlevel paved outdoor surfaces w/ LRAD at S level in order to indicate return to community negotiation.     Status New     Additional Long Term Goals   Additional Long Term  Goals Yes     PT LONG TERM GOAL #6   Title Pt will improve 6MWT by 150' from baseline in order to  indicate improved functional endurance.     Status New               Plan - 07/25/16 1603    Clinical Impression Statement Skilled session focusd on high level balance with emphasis on SLS and ankle/hip strategy.  Also briefly went over HEP to ensure compliance and appropriate technique, along with gait for improved quality, posture and improved heel to toe contact.  Pt tolerated all tasks very well.     Rehab Potential Good   PT Frequency 1x / week  due to high copay, would like to see 2x/wk   PT Duration 8 weeks   PT Treatment/Interventions ADLs/Self Care Home Management;Electrical Stimulation;DME Instruction;Gait training;Stair training;Functional mobility training;Therapeutic activities;Therapeutic exercise;Balance training;Neuromuscular re-education;Patient/family education;Orthotic Fit/Training;Passive range of motion;Vestibular   PT Next Visit Plan work on gait speed with improved quality of gait, increased stride length;continue to work on balance. advance/add to HEP as needed   Consulted and Agree with Plan of Care Patient;Family member/caregiver   Family Member Consulted wife and caregiver      Patient will benefit from skilled therapeutic intervention in order to improve the following deficits and impairments:  Abnormal gait, Decreased activity tolerance, Decreased balance, Decreased cognition, Decreased endurance, Decreased knowledge of use of DME, Decreased mobility, Decreased range of motion, Decreased strength, Impaired perceived functional ability, Impaired flexibility, Postural dysfunction  Visit Diagnosis: Unsteadiness on feet  Muscle weakness (generalized)  Other abnormalities of gait and mobility     Problem List Patient Active Problem List   Diagnosis Date Noted  . Delayed gastric emptying 04/22/2016  . Respiratory failure (HCC) 04/16/2016  . SOB (shortness of breath)   . NSVT (nonsustained ventricular tachycardia) (HCC)   . Elevated troponin  04/15/2016  . Acute on chronic diastolic heart failure (HCC)   . Gastric dysmotility   . Gastrojejunostomy tube 02/06/2016 03/10/2016  . Hyponatremia   . Anxiety and depression   . Sleep disturbance   . Orthostasis   . Leukocytosis   . Hyperglycemia   . FTT (failure to thrive) in adult   . Anemia of chronic disease 01/31/2016  . Stage 2 skin ulcer of sacral region (HCC) 01/31/2016  . Hypomagnesemia 01/21/2016  . Hypokalemia 01/21/2016  . Pressure injury of skin 01/20/2016  . Pleural effusion   . Gastroesophageal reflux disease 01/19/2016  . Insomnia 01/19/2016  . Skin ulcer of abdominal wall, limited to breakdown of skin (HCC) 01/17/2016  . Dysphagia 01/17/2016  . Debility 01/17/2016  . 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 Vitamin B12 deficiency 01/02/2016  . BPH (benign prostatic hyperplasia)   . Posterior vitreous detachment   . Salzmann's nodular dystrophy of left eye   . Hyperopia of both eyes with astigmatism and presbyopia   . Central pterygium of right eye   . Pressure ulcer of buttock, right, unstageable (HCC) 12/30/2015  . Hypernatremia 12/28/2015  . Tobacco abuse 12/09/2015  . Gastritis 12/09/2015  . Respiratory failure, acute (HCC)   . Protein-calorie malnutrition, severe 12/04/2015  . Encephalopathy acute 12/03/2015  . Anxiety state 09/28/2015  . Hyperlipidemia   . 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 Coronary atherosclerosis of native coronary artery 04/24/2013  . Essential hypertension, benign 04/24/2013  . Other and unspecified hyperlipidemia 04/24/2013  . Central pterygium 10/30/2012  . 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 Cataract, nuclear sclerotic senile 10/30/2012    Harriet Butte, PT, MPT [facility] [address] [place], [place], [address] Phone: [phone]   Fax:  [phone] 07/25/16, 7:47 PM  Name:  [name] MRN: [id number] Date of Birth: 1940-08-15

## 2016-07-27 NOTE — Therapy (Signed)
[facility] [address] [place], [place], [address] Phone: [phone]   Fax:  [phone]  Speech Language Pathology Treatment  Patient Details  Name: [name] MRN: [id number] Date of Birth: [birth year] Referring Provider: [name], MD  Encounter Date: 07/25/2016     07/25/16 1620  Symptoms/Limitations  Subjective "It's like I scraped it or something," pt complains of right throat pain with swallowing and speaking  Patient is accompained by: Family member  Pain Assessment  Currently in Pain? Yes  Pain Score 4  Pain Location Throat  Pain Orientation Right  Pain Descriptors / Indicators Sore  Pain Type Acute pain  Pain Onset Today  Aggravating Factors  swallowing  Pain Relieving Factors not swallowing    07/25/16 1622  General Information  Behavior/Cognition Alert;Cooperative  Patient Positioning Upright in chair  Oral care provided N/A  Treatment Provided  Treatment provided Cognitive-Linquistic  Cognitive-Linquistic Treatment  Treatment focused on Dysarthria;Voice  Skilled Treatment Pt brought his expiratory RMT trainer to today's session. SLP evaluated pt's maximum inspiratory and expiratory pressure via manometer. Pt described HEP for dysarthria. He stated he had throat pain today after his home practice just before lunch, as well as noted some coughing/choking during his meal. SLP instructed to bring all materials next session, refrain from practicing if he notes pain. Provided education re: swallowing precautions, oral care, monitoring for signs of pneumonia. Pt observed with chest breathing, reduced breath support. Provided training in AB.  Assessment / Recommendations / Plan  Plan Continue with current plan of care;Goals updated  Progression Toward Goals  Progression toward goals Progressing toward goals     07/25/16 1422  Education  Education provided Yes  PT Education  Education Details  swallowing precautions, oral care, signs of pneumonia, abdominal breathing  Person(s) Educated Patient;Spouse  Methods Explanation;Demonstration;Verbal cues;Tactile cues;Handout  Comprehension Verbalized understanding;Returned demonstration;Need further instruction     07/25/16 1422  SLP Visits / Re-Eval  Visit Number 3  Number of Visits 9  Date for SLP Re-Evaluation 09/02/16  SLP Time Calculation  SLP Start Time 1420  SLP Stop Time  1504  SLP Time Calculation (min) 44 min  SLP - End of Session  Activity Tolerance Patient tolerated treatment well      07/25/16 1422  SLP Assessment/Plan  Clinical Impression Statement Pt presents with ataxic dysarthria as well as impairments in swallowing, cognition. Maximum inspiratory pressure below lower limit of normal for age. Recommend skilled ST  in order to improve intelligibility, maximize cognitive function, reduce risks for aspiration and to improve quality of life.  Speech Therapy Frequency 1x /week  Duration (8 weeks)  Treatment/Interventions Aspiration precaution training;Cognitive reorganization;SLP instruction and feedback;Oral motor exercises;Diet toleration management by SLP;Patient/family education;Functional tasks;Pharyngeal strengthening exercises;Environmental controls;Compensatory strategies  Potential to Achieve Goals Good  SLP Home Exercise Plan abdominal breathing  Consulted and Agree with Plan of Care Patient;Family member/caregiver  Family Member Consulted wife     Past Medical History:  Diagnosis Date  . 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 Aspiration pneumonia (HCC) 01/17/2016  . Bilateral dry eyes   . Bile peritonitis due to gastric tube dislodgement 12/03/2015  . BPH (benign prostatic hyperplasia)   . Central pterygium of right eye   . Chronic respiratory failure with hypoxia (HCC)   . Coronary artery disease 1997, 2009   bare mental stent right coronart artery,  occlusive followed by Dr. Garnette Scheuermann in [place]  . Degenerative disc  disease    LOWER BACK  . Dysphonia   . ED (erectile dysfunction)   . 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 Gastric outlet obstruction 11/24/2015  . Gastroesophageal reflux disease   . Hyperlipidemia   . Hyperopia of both eyes with astigmatism and presbyopia   . Hypertension   . Ischemic right index finger at tip 12/09/2015  . Nuclear senile cataract    Dr. Bernadette [facility]itHayden [facility] [place]  . Peptic ulcer   . Pneumatosis of intestines 12/11/2015  . Pneumonia    HISTORY OF IN CHILDHOOD  . Posterior vitreous detachment 10/30/2012  . Pyloric stricture 11/24/2015  . Salzmann's nodular dystrophy of left eye   . SBO (small bowel obstruction) (HCC) 01/20/2016  . Urinary hesitancy   . Wears glasses     Past Surgical History:  Procedure Laterality Date  . CARDIAC CATHETERIZATION    . CHOLECYSTECTOMY    . ESOPHAGOGASTRODUODENOSCOPY N/A 12/01/2015   Procedure: ESOPHAGOGASTRODUODENOSCOPY (EGD) BALLOON DILATION OF DUODENAL STRICTURE;  Surgeon: Karie SodaSteven Gross, MD;  Location: [facility];  Service: General;  Laterality: N/A;  . ESOPHAGOGASTRODUODENOSCOPY N/A 12/14/2015   Procedure: ESOPHAGOGASTRODUODENOSCOPY (EGD);  Surgeon: Vida RiggerMarc Magod, MD;  [facility];  Service: Endoscopy;  Laterality: N/A;  Patient has tracheostomy  . ESOPHAGOGASTRODUODENOSCOPY (EGD) WITH PROPOFOL N/A 07/01/2015   Procedure: ESOPHAGOGASTRODUODENOSCOPY (EGD) WITH PROPOFOL;  Surgeon: Willis ModenaWilliam Outlaw, MD;  Location: [facility];  Service: Endoscopy;  Laterality: N/A;  . ESOPHAGOGASTRODUODENOSCOPY (EGD) WITH PROPOFOL N/A 11/23/2015   Procedure: ESOPHAGOGASTRODUODENOSCOPY (EGD) WITH PROPOFOL;  Surgeon: Willis ModenaWilliam Outlaw, MD;  [facility];  Service: Endoscopy;  Laterality: N/A;  may need to intubate  . EYE SURGERY     growth on right eye removed   . IR GENERIC HISTORICAL  12/13/2015   IR REPLC DUODEN/JEJUNO  TUBE PERCUT W/FLUORO 12/13/2015 Jolaine ClickArthur Hoss, MD [facility]  . IR GENERIC HISTORICAL  02/03/2016   IR REPLC GASTRO/COLONIC TUBE PERCUT W/FLUORO 02/03/2016 Berdine DanceMichael Shick, MD [facility]  . IR GENERIC HISTORICAL  02/04/2016   IR GASTR TUBE CONVERT GASTR-JEJ PER W/FL MOD SED 02/04/2016 Malachy MoanHeath McCullough, MD [facility]  . IR GENERIC HISTORICAL  02/25/2016   IR SINUS/FIST TUBE CHK-NON GI 02/25/2016 Irish LackGlenn Yamagata, MD [facility]  . IR GENERIC HISTORICAL  03/18/2016   IR CM INJ ANY COLONIC TUBE W/FLUORO 03/18/2016 Simonne ComeJohn Watts, MD [facility]  . IR GENERIC HISTORICAL  03/18/2016   IR GJ TUBE CHANGE 03/18/2016 Simonne ComeJohn Watts, MD [facility]  . IR GENERIC HISTORICAL  03/22/2016   IR CM INJ ANY COLONIC TUBE W/FLUORO 03/22/2016 Simonne ComeJohn Watts, MD [facility]  . IR GENERIC HISTORICAL  04/13/2016   IR CM INJ ANY COLONIC TUBE W/FLUORO 04/13/2016 Richarda OverlieAdam Henn, MD [facility]  . LAPAROSCOPIC GASTROSTOMY N/A 12/01/2015   Procedure: LAPAROSCOPIC PLACEMENT OF FEEDING JEJUNOSTOMY AND GASTROSTOMY TUBE;  Surgeon: Karie SodaSteven Gross, MD;  Location: [facility];  Service: General;  Laterality: N/A;  . LAPAROSCOPY N/A 12/03/2015   Procedure: LAPAROSCOPY DIAGNOSTIC, OMENTOPEXY, JEJUNOSTOMY, WASH OUT;  Surgeon: Karie SodaSteven Gross, MD;  Location: [facility];  Service: General;  Laterality: N/A;  . LAPAROTOMY N/A 02/06/2016   Procedure: EXPLORATORY LAPAROTOMY, LYSIS OF ADHESIONS, SEGMENTAL SMALL BOWEL RESECTION, GASTROJEJUNOSTOMY;  Surgeon: Chevis PrettyPaul Toth III, MD;  Location: [facility];  Service: General;  Laterality: N/A;  . STENT TO HEART     2 1997 AND 1 IN 1996  . TONSILLECTOMY    . XI ROBOTIC VAGOTOMY AND ANTRECTOMY N/A 09/25/2015   Procedure: XI ROBOTIC ANTERIOR AND  POSTERIOR VAGOTOMY, BILROTH I  ANASTOMOSIS DOR FUNDIPLICATION, OMENTOPEXY  UPPER ENDOSCOPY;  Surgeon: Karie Soda, MD;  Location: [facility];  Service: General;  Laterality: N/A;    There were no vitals filed for this visit.                 SLP Short Term Goals - 07/25/16 1422       SLP SHORT TERM GOAL #1   Title pt will complete HEP for dysarthria with rare min A over three sessions   Time 3   Period Weeks   Status On-going     SLP SHORT TERM GOAL #2   Title pt will use compensatory strategies for dysarthria 80% of the time in sentence level responses   Time 3   Period Weeks   Status On-going     SLP SHORT TERM GOAL #3   Title Pt will participate in assessment of cognitive communication within first 2 sessions.   Status Achieved          SLP Long Term Goals - 07/27/16 1026      SLP LONG TERM GOAL #1   Title pt will demo HEP with modified independence over two sessions   Time 7   Period Weeks   Status On-going     SLP LONG TERM GOAL #2   Title Pt will use compensatory strategies for dysarthria to improve intelligiblity to 75% over 5 minutes mod complex conversation.   Time 7   Period Weeks   Status On-going     SLP LONG TERM GOAL #3   Title pt will tell SLP 3 s/s aspiration PNA with modified independence   Time 7   Period Weeks   Status On-going        Patient will benefit from skilled therapeutic intervention in order to improve the following deficits and impairments:   Dysphagia, unspecified type  Dysarthria and anarthria  Cognitive communication deficit    Problem List Patient Active Problem List   Diagnosis Date Noted  . Delayed gastric emptying 04/22/2016  . Respiratory failure (HCC) 04/16/2016  . SOB (shortness of breath)   . NSVT (nonsustained ventricular tachycardia) (HCC)   . Elevated troponin 04/15/2016  . Acute on chronic diastolic heart failure (HCC)   . Gastric dysmotility   . Gastrojejunostomy tube 02/06/2016 03/10/2016  . Hyponatremia   . Anxiety and depression   . Sleep disturbance   . Orthostasis   . Leukocytosis   . Hyperglycemia   . FTT (failure to thrive) in adult   . Anemia of chronic disease 01/31/2016  . Stage 2 skin ulcer of sacral region (HCC) 01/31/2016  . Hypomagnesemia 01/21/2016  .  Hypokalemia 01/21/2016  . Pressure injury of skin 01/20/2016  . Pleural effusion   . Gastroesophageal reflux disease 01/19/2016  . Insomnia 01/19/2016  . Skin ulcer of abdominal wall, limited to breakdown of skin (HCC) 01/17/2016  . Dysphagia 01/17/2016  . Debility 01/17/2016  . 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 Vitamin B12 deficiency 01/02/2016  . BPH (benign prostatic hyperplasia)   . Posterior vitreous detachment   . Salzmann's nodular dystrophy of left eye   . Hyperopia of both eyes with astigmatism and presbyopia   . Central pterygium of right eye   . Pressure ulcer of buttock, right, unstageable (HCC) 12/30/2015  . Hypernatremia 12/28/2015  . Tobacco abuse 12/09/2015  . Gastritis 12/09/2015  . Respiratory failure, acute (HCC)   . Protein-calorie malnutrition, severe 12/04/2015  . Encephalopathy acute 12/03/2015  .  Anxiety state 09/28/2015  . Hyperlipidemia   . 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 Coronary atherosclerosis of native coronary artery 04/24/2013  . Essential hypertension, benign 04/24/2013  . Other and unspecified hyperlipidemia 04/24/2013  . Central pterygium 10/30/2012  . 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 Cataract, nuclear sclerotic senile 10/30/2012    Rondel Baton, MS, CCC-SLP Speech-Language Pathologist   Arlana Lindau 07/27/2016, 10:27 AM  [facility] [address] [place], [place], [address] Phone: [phone]   Fax:  [phone]   Name: [name] MRN: [id number] Date of Birth: January 30, 1941

## 2016-08-02 ENCOUNTER — Ambulatory Visit: Payer: Medicare Other | Admitting: Physical Therapy

## 2016-08-02 ENCOUNTER — Encounter: Payer: Self-pay | Admitting: Physical Therapy

## 2016-08-02 ENCOUNTER — Ambulatory Visit: Payer: Medicare Other | Admitting: Speech Pathology

## 2016-08-02 DIAGNOSIS — R41841 Cognitive communication deficit: Secondary | ICD-10-CM

## 2016-08-02 DIAGNOSIS — R471 Dysarthria and anarthria: Secondary | ICD-10-CM

## 2016-08-02 DIAGNOSIS — R2681 Unsteadiness on feet: Secondary | ICD-10-CM | POA: Diagnosis not present

## 2016-08-02 DIAGNOSIS — R1310 Dysphagia, unspecified: Secondary | ICD-10-CM

## 2016-08-02 DIAGNOSIS — R2689 Other abnormalities of gait and mobility: Secondary | ICD-10-CM

## 2016-08-02 DIAGNOSIS — M6281 Muscle weakness (generalized): Secondary | ICD-10-CM

## 2016-08-04 DIAGNOSIS — Z5181 Encounter for therapeutic drug level monitoring: Secondary | ICD-10-CM

## 2016-08-04 DIAGNOSIS — F418 Other specified anxiety disorders: Secondary | ICD-10-CM

## 2016-08-04 DIAGNOSIS — Z903 Acquired absence of stomach [part of]: Secondary | ICD-10-CM

## 2016-08-04 DIAGNOSIS — J9600 Acute respiratory failure, unspecified whether with hypoxia or hypercapnia: Secondary | ICD-10-CM | POA: Diagnosis not present

## 2016-08-04 DIAGNOSIS — Z87891 Personal history of nicotine dependence: Secondary | ICD-10-CM

## 2016-08-04 DIAGNOSIS — K632 Fistula of intestine: Secondary | ICD-10-CM

## 2016-08-04 DIAGNOSIS — I5031 Acute diastolic (congestive) heart failure: Secondary | ICD-10-CM

## 2016-08-04 DIAGNOSIS — Z79899 Other long term (current) drug therapy: Secondary | ICD-10-CM

## 2016-08-04 DIAGNOSIS — E43 Unspecified severe protein-calorie malnutrition: Secondary | ICD-10-CM

## 2016-08-04 DIAGNOSIS — I2510 Atherosclerotic heart disease of native coronary artery without angina pectoris: Secondary | ICD-10-CM | POA: Diagnosis not present

## 2016-08-04 DIAGNOSIS — I110 Hypertensive heart disease with heart failure: Secondary | ICD-10-CM | POA: Diagnosis not present

## 2016-08-04 DIAGNOSIS — L989 Disorder of the skin and subcutaneous tissue, unspecified: Secondary | ICD-10-CM | POA: Diagnosis not present

## 2016-08-04 DIAGNOSIS — Z452 Encounter for adjustment and management of vascular access device: Secondary | ICD-10-CM

## 2016-08-04 DIAGNOSIS — R1310 Dysphagia, unspecified: Secondary | ICD-10-CM

## 2016-08-04 DIAGNOSIS — K56699 Other intestinal obstruction unspecified as to partial versus complete obstruction: Secondary | ICD-10-CM

## 2016-08-04 DIAGNOSIS — D638 Anemia in other chronic diseases classified elsewhere: Secondary | ICD-10-CM

## 2016-08-04 NOTE — Therapy (Signed)
[facility] [address] [place], [place], [address] Phone: [phone]   Fax:  [phone]  Physical Therapy Treatment  Patient Details  Name: [name] MRN: [id number] Date of Birth: [birth year] Referring Provider: [name], MD  Encounter Date: 08/02/2016   08/02/16 1453  PT Visits / Re-Eval  Visit Number 4  Number of Visits 9  Date for PT Re-Evaluation 09/05/16  Authorization  Authorization Type [employer] MCR-G Code on every 10th visit  PT Time Calculation  PT Start Time 1450  PT Stop Time 1530  PT Time Calculation (min) 40 min  PT - End of Session  Equipment Utilized During Treatment Gait belt  Activity Tolerance Patient tolerated treatment well  Behavior During Therapy [facility] for tasks assessed/performed     Past Medical History:  Diagnosis Date  . 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 Aspiration pneumonia (HCC) 01/17/2016  . Bilateral dry eyes   . Bile peritonitis due to gastric tube dislodgement 12/03/2015  . BPH (benign prostatic hyperplasia)   . Central pterygium of right eye   . Chronic respiratory failure with hypoxia (HCC)   . Coronary artery disease 1997, 2009   bare mental stent right coronart artery, occlusive followed by Dr. Garnette Scheuermann in [place]  . Degenerative disc disease    LOWER BACK  . Dysphonia   . ED (erectile dysfunction)   . 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 Gastric outlet obstruction 11/24/2015  . Gastroesophageal reflux disease   . Hyperlipidemia   . Hyperopia of both eyes with astigmatism and presbyopia   . Hypertension   . Ischemic right index finger at tip 12/09/2015  . Nuclear senile cataract    Dr. [facility] in [place]  . Peptic ulcer   . Pneumatosis of intestines 12/11/2015  . Pneumonia    HISTORY OF IN CHILDHOOD  . Posterior vitreous detachment 10/30/2012   . Pyloric stricture 11/24/2015  . Salzmann's nodular dystrophy of left eye   . SBO (small bowel obstruction) (HCC) 01/20/2016  . Urinary hesitancy   . Wears glasses     Past Surgical History:  Procedure Laterality Date  . CARDIAC CATHETERIZATION    . CHOLECYSTECTOMY    . ESOPHAGOGASTRODUODENOSCOPY N/A 12/01/2015   Procedure: ESOPHAGOGASTRODUODENOSCOPY (EGD) BALLOON DILATION OF DUODENAL STRICTURE;  Surgeon: Karie Soda, MD;  Location: [facility];  Service: General;  Laterality: N/A;  . ESOPHAGOGASTRODUODENOSCOPY N/A 12/14/2015   Procedure: ESOPHAGOGASTRODUODENOSCOPY (EGD);  Surgeon: Vida Rigger, MD;  Location: [facility];  Service: Endoscopy;  Laterality: N/A;  Patient has tracheostomy  . ESOPHAGOGASTRODUODENOSCOPY (EGD) WITH PROPOFOL N/A 07/01/2015   Procedure: ESOPHAGOGASTRODUODENOSCOPY (EGD) WITH PROPOFOL;  Surgeon: Willis Modena, MD;  Location: [facility];  Service: Endoscopy;  Laterality: N/A;  . ESOPHAGOGASTRODUODENOSCOPY (EGD) WITH PROPOFOL N/A 11/23/2015   Procedure: ESOPHAGOGASTRODUODENOSCOPY (EGD) WITH PROPOFOL;  Surgeon: Willis Modena, MD;  Location: [facility];  Service: Endoscopy;  Laterality: N/A;  may need to intubate  . EYE SURGERY     growth on right eye removed   . IR GENERIC HISTORICAL  12/13/2015   IR REPLC DUODEN/JEJUNO TUBE PERCUT W/FLUORO 12/13/2015 [name], MD [facility]  . IR GENERIC HISTORICAL  02/03/2016   IR REPLC GASTRO/COLONIC TUBE PERCUT W/FLUORO 02/03/2016 Berdine Dance, MD [facility]  . IR GENERIC HISTORICAL  02/04/2016   IR GASTR TUBE CONVERT GASTR-JEJ PER W/FL MOD SED 02/04/2016 Malachy Moan, MD [facility]  . IR GENERIC HISTORICAL  02/25/2016  IR SINUS/FIST TUBE CHK-NON GI 02/25/2016 Irish LackGlenn Yamagata, MD [facility]  . IR GENERIC HISTORICAL  03/18/2016   IR CM INJ ANY COLONIC TUBE W/FLUORO 03/18/2016 Simonne ComeJohn Watts, MD [facility]  . IR GENERIC HISTORICAL  03/18/2016   IR GJ TUBE CHANGE 03/18/2016 Simonne ComeJohn Watts, MD [facility]  . IR GENERIC  HISTORICAL  03/22/2016   IR CM INJ ANY COLONIC TUBE W/FLUORO 03/22/2016 Simonne ComeJohn Watts, MD [facility]  . IR GENERIC HISTORICAL  04/13/2016   IR CM INJ ANY COLONIC TUBE W/FLUORO 04/13/2016 Richarda OverlieAdam Henn, MD [facility]  . LAPAROSCOPIC GASTROSTOMY N/A 12/01/2015   Procedure: LAPAROSCOPIC PLACEMENT OF FEEDING JEJUNOSTOMY AND GASTROSTOMY TUBE;  Surgeon: Karie SodaSteven Gross, MD;  Location: [facility];  Service: General;  Laterality: N/A;  . LAPAROSCOPY N/A 12/03/2015   Procedure: LAPAROSCOPY DIAGNOSTIC, OMENTOPEXY, JEJUNOSTOMY, WASH OUT;  Surgeon: Karie SodaSteven Gross, MD;  Location: [facility];  Service: General;  Laterality: N/A;  . LAPAROTOMY N/A 02/06/2016   Procedure: EXPLORATORY LAPAROTOMY, LYSIS OF ADHESIONS, SEGMENTAL SMALL BOWEL RESECTION, GASTROJEJUNOSTOMY;  Surgeon: Chevis PrettyPaul Toth III, MD;  Location: [facility];  Service: General;  Laterality: N/A;  . STENT TO HEART     2 1997 AND 1 IN 1996  . TONSILLECTOMY    . XI ROBOTIC VAGOTOMY AND ANTRECTOMY N/A 09/25/2015   Procedure: XI ROBOTIC ANTERIOR AND POSTERIOR VAGOTOMY, BILROTH I  ANASTOMOSIS DOR FUNDIPLICATION, OMENTOPEXY  UPPER ENDOSCOPY;  Surgeon: Karie SodaSteven Gross, MD;  Location: [facility];  Service: General;  Laterality: N/A;    There were no vitals filed for this visit.     08/02/16 1453  Symptoms/Limitations  Subjective No new complaints. No falls or pain to report.   Patient is accompained by: Family member (spouse, [name])  Pertinent History History of several GI surgeries, dementia, swallowing/speech difficulties  Limitations House hold activities;Walking;Standing  Pain Assessment  Currently in Pain? No/denies  Pain Score 0      08/02/16 1455  Transfers  Transfers Sit to Stand;Stand to Sit  Sit to Stand 5: Supervision;With upper extremity assist;From bed;From chair/3-in-1  Stand to Sit 5: Supervision;With upper extremity assist;Without upper extremity assist;To bed;To chair/3-in-1  Ambulation/Gait  Ambulation/Gait Yes  Ambulation/Gait Assistance 5: Supervision   Ambulation/Gait Assistance Details cues on posture and for increased step length with gait. no balance issues noted with gait without AD  Ambulation Distance (Feet) 220 Feet (x1 cane, 220 x1 no AD)  Assistive device Straight cane;None  Gait Pattern Step-through pattern;Decreased stride length;Right flexed knee in stance;Left flexed knee in stance;Trunk flexed  Ambulation Surface Level;Indoor     08/02/16 1502  Balance Exercises: Standing  Rockerboard Anterior/posterior;Lateral;Head turns;EO;EC;20 seconds;10 reps  Balance Beam standing across blue foam balance beam: alternating fwd stepping to floor, back onto beam x 10 reps each leg, then alternating stepping backwards to floor and back onto beam x 10 reps each leg. Intermittent UE touch to bars for balance with emphasis on larger steps and weight shifting with activity                                     Balance Exercises: Standing  Rebounder Limitations performed both ways on balance board with no UE support: EO rocking board with emphasis on tall posture, EC alternating UE raises, bil UE raises and angel wings x 10 reps each; EC no head movements, progressing to EC head movements left<>right and up<>down. min to mod assist for balance with cues on posture,  stance position on board and weight shifting to assist with balance.          PT Short Term Goals - 07/07/16 1909      PT SHORT TERM GOAL #1   Title Pt/family/caregiver will be independent with initial HEP in order to indicate improved functional mobility and decreased fall risk.  (Target Date: 08/06/16)   Status New     PT SHORT TERM GOAL #2   Title Pt will improve BERG balance score to 41/56 in order to indicate decreased fall risk.     Status New     PT SHORT TERM GOAL #3   Title Pt will perform 5TSS in <17 secs with single UE support in order to indicate improved fuctional strength and decreased fall risk.     Status New     PT SHORT TERM GOAL #4   Title Pt will improve  gait speed to 2.21 ft/sec w/ LRAD in order to indicate improved efficiency of gait and decreased fall risk.     Status New     PT SHORT TERM GOAL #5   Title Will assess and improve distance by 62' from baseline in order to indicate improved functional endurance.     Status New           PT Long Term Goals - 07/07/16 1913      PT LONG TERM GOAL #1   Title Pt/wife/caregiver will be independent with final HEP in order to indicate continued fitness and decreased fall risk.  (Target Date: 09/05/16)   Status New     PT LONG TERM GOAL #2   Title Pt will improve BERG balance score to 44/56 in order to indicate decreased fall risk.     Status New     PT LONG TERM GOAL #3   Title Pt will perform 5TSS in </=15 secs without UE support in order to indicate decreased fall risk and improved functional strength.     Status New     PT LONG TERM GOAL #4   Title Pt will improve gait speed to >/=2.62 w/ LRAD in order to indicate improved efficiency of gait and decreased fall risk.     Status New     PT LONG TERM GOAL #5   Title Pt will ambulate up to 500' over unlevel paved outdoor surfaces w/ LRAD at S level in order to indicate return to community negotiation.     Status New     Additional Long Term Goals   Additional Long Term Goals Yes     PT LONG TERM GOAL #6   Title Pt will improve by 150' from baseline in order to indicate improved functional endurance.     Status New        08/02/16 1453  Plan  Clinical Impression Statement Today's skilled session continued to address gait with and without AD and high level balance.  No issues noted in session today. Pt is making steady progress toward goals and should benefit from continued PT to progress toward unmet goals.                 Pt will benefit from skilled therapeutic intervention in order to improve on the following deficits Abnormal gait;Decreased activity tolerance;Decreased balance;Decreased cognition;Decreased  endurance;Decreased knowledge of use of DME;Decreased mobility;Decreased range of motion;Decreased strength;Impaired perceived functional ability;Impaired flexibility;Postural dysfunction  Rehab Potential Good  PT Frequency 1x / week (due to high copay, would like to see 2x/wk)  PT Duration 8 weeks  PT Treatment/Interventions ADLs/Self Care Home Management;Electrical Stimulation;DME Instruction;Gait training;Stair training;Functional mobility training;Therapeutic activities;Therapeutic exercise;Balance training;Neuromuscular re-education;Patient/family education;Orthotic Fit/Training;Passive range of motion;Vestibular  PT Next Visit Plan work on gait speed with improved quality of gait, increased stride length;continue to work on balance. advance/add to HEP as needed  Consulted and Agree with Plan of Care Patient;Family member/caregiver  Family Member Consulted wife and caregiver     Patient will benefit from skilled therapeutic intervention in order to improve the following deficits and impairments:  Abnormal gait, Decreased activity tolerance, Decreased balance, Decreased cognition, Decreased endurance, Decreased knowledge of use of DME, Decreased mobility, Decreased range of motion, Decreased strength, Impaired perceived functional ability, Impaired flexibility, Postural dysfunction  Visit Diagnosis: Unsteadiness on feet  Muscle weakness (generalized)  Other abnormalities of gait and mobility     Problem List Patient Active Problem List   Diagnosis Date Noted  . Delayed gastric emptying 04/22/2016  . Respiratory failure (HCC) 04/16/2016  . SOB (shortness of breath)   . NSVT (nonsustained ventricular tachycardia) (HCC)   . Elevated troponin 04/15/2016  . Acute on chronic diastolic heart failure (HCC)   . Gastric dysmotility   . Gastrojejunostomy tube 02/06/2016 03/10/2016  . Hyponatremia   . Anxiety and depression   . Sleep disturbance   . Orthostasis   . Leukocytosis   .  Hyperglycemia   . FTT (failure to thrive) in adult   . Anemia of chronic disease 01/31/2016  . Stage 2 skin ulcer of sacral region (HCC) 01/31/2016  . Hypomagnesemia 01/21/2016  . Hypokalemia 01/21/2016  . Pressure injury of skin 01/20/2016  . Pleural effusion   . Gastroesophageal reflux disease 01/19/2016  . Insomnia 01/19/2016  . Skin ulcer of abdominal wall, limited to breakdown of skin (HCC) 01/17/2016  . Dysphagia 01/17/2016  . Debility 01/17/2016  . 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 Vitamin B12 deficiency 01/02/2016  . BPH (benign prostatic hyperplasia)   . Posterior vitreous detachment   . Salzmann's nodular dystrophy of left eye   . Hyperopia of both eyes with astigmatism and presbyopia   . Central pterygium of right eye   . Pressure ulcer of buttock, right, unstageable (HCC) 12/30/2015  . Hypernatremia 12/28/2015  . Tobacco abuse 12/09/2015  . Gastritis 12/09/2015  . Respiratory failure, acute (HCC)   . Protein-calorie malnutrition, severe 12/04/2015  . Encephalopathy acute 12/03/2015  . Anxiety state 09/28/2015  . Hyperlipidemia   . 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 Coronary atherosclerosis of native coronary artery 04/24/2013  . Essential hypertension, benign 04/24/2013  . Other and unspecified hyperlipidemia 04/24/2013  . Central pterygium 10/30/2012  . 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 Cataract, nuclear sclerotic senile 10/30/2012    Sallyanne Kuster, PTA, [facility] [address] [place], [place] [address] [phone] 08/04/16, 12:01 PM   Name: [name] MRN: [id number] Date of Birth: 09/21/1940

## 2016-08-04 NOTE — Therapy (Signed)
[facility] [address] [place], [place], [address] Phone: [phone]   Fax:  [phone]  Speech Language Pathology Treatment  Patient Details  Name: [name] MRN: [id number] Date of Birth: 1940/06/18 Referring Provider: Kirby FunkGriffin, John, MD  Encounter Date: 08/02/2016    Past Medical History:  Diagnosis Date   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 09/25/2015 09/25/2015   Aspiration pneumonia (HCC) 01/17/2016   Bilateral dry eyes    Bile peritonitis due to gastric tube dislodgement 12/03/2015   BPH (benign prostatic hyperplasia)    Central pterygium of right eye    Chronic respiratory failure with hypoxia (HCC)    Coronary artery disease 1997, 2009   bare mental stent right coronart artery, occlusive followed [place] Dr. 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 [place]   Peptic ulcer    Pneumatosis of intestines 12/11/2015   Pneumonia    HISTORY OF IN CHILDHOOD   Posterior vitreous detachment 10/30/2012   Pyloric stricture 11/24/2015   Salzmann's nodular dystrophy of left eye    SBO (small bowel obstruction) (HCC) 01/20/2016   Urinary hesitancy    Wears glasses     Past Surgical History:  Procedure Laterality Date   CARDIAC CATHETERIZATION     CHOLECYSTECTOMY     ESOPHAGOGASTRODUODENOSCOPY N/A 12/01/2015   Procedure: ESOPHAGOGASTRODUODENOSCOPY (EGD) BALLOON DILATION OF DUODENAL STRICTURE;  Surgeon: Karie SodaSteven Gross, MD;  [facility]: [facility];   Service: General;  Laterality: N/A;   ESOPHAGOGASTRODUODENOSCOPY N/A 12/14/2015   Procedure: ESOPHAGOGASTRODUODENOSCOPY (EGD);  Surgeon: Vida RiggerMarc Magod, MD;  [facility];  Service: Endoscopy;  Laterality: N/A;  Patient has tracheostomy   ESOPHAGOGASTRODUODENOSCOPY (EGD) WITH PROPOFOL N/A 07/01/2015   Procedure: ESOPHAGOGASTRODUODENOSCOPY (EGD) WITH PROPOFOL;  Surgeon: Willis ModenaWilliam Outlaw, MD;  [facility];  Service: Endoscopy;  Laterality: N/A;   ESOPHAGOGASTRODUODENOSCOPY (EGD) WITH PROPOFOL N/A 11/23/2015   Procedure: ESOPHAGOGASTRODUODENOSCOPY (EGD) WITH PROPOFOL;  Surgeon: Willis ModenaWilliam Outlaw, MD;  [facility];  Service: Endoscopy;  Laterality: N/A;  may need to intubate   EYE SURGERY     growth on right eye removed    IR GENERIC HISTORICAL  12/13/2015   IR REPLC DUODEN/JEJUNO TUBE PERCUT W/FLUORO 12/13/2015 Jolaine ClickArthur Hoss, MD [facility]   IR GENERIC HISTORICAL  02/03/2016   IR REPLC GASTRO/COLONIC TUBE PERCUT W/FLUORO 02/03/2016 Berdine DanceMichael Shick, MD [facility]   IR GENERIC HISTORICAL  02/04/2016   IR GASTR TUBE CONVERT GASTR-JEJ PER W/FL MOD SED 02/04/2016 Malachy MoanHeath McCullough, MD [facility]   IR GENERIC HISTORICAL  02/25/2016   IR SINUS/FIST TUBE CHK-NON GI 02/25/2016 Irish LackGlenn Yamagata, MD [facility]   IR GENERIC HISTORICAL  03/18/2016   IR CM INJ ANY COLONIC TUBE W/FLUORO 03/18/2016 Simonne ComeJohn Watts, MD [facility]   IR GENERIC HISTORICAL  03/18/2016   IR GJ TUBE CHANGE 03/18/2016 Simonne ComeJohn Watts, MD [facility]   IR GENERIC HISTORICAL  03/22/2016   IR CM INJ ANY COLONIC TUBE W/FLUORO 03/22/2016 Simonne ComeJohn Watts, MD [facility]   IR GENERIC HISTORICAL  04/13/2016  IR CM INJ ANY COLONIC TUBE W/FLUORO 04/13/2016 Richarda Overlie, MD [facility]   LAPAROSCOPIC GASTROSTOMY N/A 12/01/2015   Procedure: LAPAROSCOPIC PLACEMENT OF FEEDING JEJUNOSTOMY AND GASTROSTOMY TUBE;  Surgeon: Karie Soda, MD;  [facility];  Service: General;  Laterality: N/A;   LAPAROSCOPY N/A 12/03/2015    Procedure: LAPAROSCOPY DIAGNOSTIC, OMENTOPEXY, JEJUNOSTOMY, WASH OUT;  Surgeon: Karie Soda, MD;  [facility];  Service: General;  Laterality: N/A;   LAPAROTOMY N/A 02/06/2016   Procedure: EXPLORATORY LAPAROTOMY, LYSIS OF ADHESIONS, SEGMENTAL SMALL BOWEL RESECTION, GASTROJEJUNOSTOMY;  Surgeon: Chevis Pretty III, MD;  [facility];  Service: General;  Laterality: N/A;   STENT TO HEART     2 1997 AND 1 IN 1996   TONSILLECTOMY     XI ROBOTIC VAGOTOMY AND ANTRECTOMY N/A 09/25/2015   Procedure: XI ROBOTIC ANTERIOR AND POSTERIOR VAGOTOMY, BILROTH I  ANASTOMOSIS DOR FUNDIPLICATION, OMENTOPEXY  UPPER ENDOSCOPY;  Surgeon: Karie Soda, MD;  [facility];  Service: General;  Laterality: N/A;    There were no vitals filed for this visit.     08/02/16 1532  Symptoms/Limitations  Subjective "Baker Janus got [id number]" (clobbered) re: favorite sports team  Patient is accompained by: Family member  Pain Assessment  Currently in Pain? No/denies     08/02/16 1534  General Information  Behavior/Cognition Alert;Cooperative  Patient Positioning Upright in chair  Oral care provided N/A  Treatment Provided  Treatment provided Cognitive-Linquistic  Pain Assessment  Pain Assessment No/denies pain  Cognitive-Linquistic Treatment  Treatment focused on Dysarthria;Voice;Patient/family/caregiver education  Skilled Treatment SLP provided education re: RMT and patients inhalation/exhalation pressures assessed during prior session. Provided inspiratory trainer device, demonstrated use, calibrated device to pts settings. With device set to 18cm pressure, pt completed 5x5 repetitions with SLP providing initial mod A fading to rare min A for technique. Continued training in AB; pt with improved success. SLP instructed in phonation of /a/ with mildly increased vocal amplitude, focus on AB. Pt with laryngeal push; SLP demonstrated and provided feedback for pt to use breath vs. laryngeal tension,  improving pts technique 85% accuracy. AB at phrase level 85% accuracy, occasional min A. Pt to write 5-10 functional phrases for training in ST sessions/home exercises to improve carryover of AB and over-articulation.  Assessment / Recommendations / Plan  Plan Continue with current plan of care;Goals updated  Progression Toward Goals  Progression toward goals Progressing toward goals     08/02/16 1535  Education  Education provided Yes  PT Education  Education Details AB, overarticulation, inspiratory training  Person(s) Educated Patient;Spouse  Methods Explanation  Comprehension Verbalized understanding;Returned demonstration;Need further instruction      08/02/16 1535  SLP Visits / Re-Eval  Visit Number 4  Number of Visits 9  Date for SLP Re-Evaluation 09/02/16  SLP Time Calculation  SLP Start Time 1533  SLP Stop Time  1617  SLP Time Calculation (min) 44 min  SLP - End of Session  Activity Tolerance Patient tolerated treatment well     08/02/16 1535  SLP Assessment/Plan  Clinical Impression Statement Pt presents with ataxic dysarthria as well as impairments in swallowing, cognition. Maximum inspiratory pressure below lower limit of normal for age. Recommend skilled ST  in order to improve intelligibility, maximize cognitive function, reduce risks for aspiration and to improve quality of life.  Speech Therapy Frequency 1x /week  Treatment/Interventions Aspiration precaution training;Cognitive reorganization;SLP instruction and feedback;Oral motor exercises;Diet toleration management by SLP;Patient/family education;Functional tasks;Pharyngeal strengthening exercises;Environmental controls;Compensatory strategies  Potential to Achieve Goals Good  SLP Home Exercise Plan abdominal breathing  Consulted and Agree with Plan of Care Patient;Family member/caregiver  Family Member Consulted wife                SLP Short Term Goals - 08/02/16 1535      SLP SHORT TERM  GOAL #1   Title pt will complete HEP for dysarthria with rare min A over two sessions   Baseline 6.18.18   Time 2   Period Weeks   Status Revised     SLP SHORT TERM GOAL #2   Title pt will use compensatory strategies for dysarthria 80% of the time in sentence level responses   Baseline phrase level 6.18.18   Time 2   Period Weeks   Status On-going     SLP SHORT TERM GOAL #3   Title Pt will participate in assessment of cognitive communication within first 2 sessions.   Status Achieved     SLP SHORT TERM GOAL #4   Title Pt will tell SLP 4 strategies to improve attention, memory.    Time 2   Period Weeks   Status On-going          SLP Long Term Goals - 08/02/16 1535      SLP LONG TERM GOAL #1   Title pt will demo HEP with modified independence over two sessions   Time 6   Period Weeks   Status On-going     SLP LONG TERM GOAL #2   Title Pt will use compensatory strategies for dysarthria to improve intelligiblity to 75% over 5 minutes mod complex conversation.   Time 6   Period Weeks   Status On-going     SLP LONG TERM GOAL #3   Title pt will tell SLP 3 s/s aspiration PNA with modified independence   Time 6   Period Weeks   Status On-going     SLP LONG TERM GOAL #4   Title Pt will report demo use of compensatory strategies for recall of daily activities, appointments or details from written materials in 2 therapy sessions.   Time 6   Period Weeks   Status On-going        Patient will benefit from skilled therapeutic intervention in order to improve the following deficits and impairments:   Dysarthria and anarthria  Dysphagia, unspecified type  Cognitive communication deficit    Problem List Patient Active Problem List   Diagnosis Date Noted   Delayed gastric emptying 04/22/2016   Respiratory failure (HCC) 04/16/2016   SOB (shortness of breath)    NSVT (nonsustained ventricular tachycardia) (HCC)    Elevated troponin 04/15/2016   Acute on  chronic diastolic heart failure (HCC)    Gastric dysmotility    Gastrojejunostomy tube 02/06/2016 03/10/2016   Hyponatremia    Anxiety and depression    Sleep disturbance    Orthostasis    Leukocytosis    Hyperglycemia    FTT (failure to thrive) in adult    Anemia of chronic disease 01/31/2016   Stage 2 skin ulcer of sacral region (HCC) 01/31/2016   Hypomagnesemia 01/21/2016   Hypokalemia 01/21/2016   Pressure injury of skin 01/20/2016   Pleural effusion    Gastroesophageal reflux disease 01/19/2016   Insomnia 01/19/2016   Skin ulcer of abdominal wall, limited to breakdown of skin (HCC) 01/17/2016   Dysphagia 01/17/2016   Debility 01/17/2016   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   Vitamin B12 deficiency 01/02/2016   BPH (benign prostatic hyperplasia)  Posterior vitreous detachment    Salzmann's nodular dystrophy of left eye    Hyperopia of both eyes with astigmatism and presbyopia    Central pterygium of right eye    Pressure ulcer of buttock, right, unstageable (HCC) 12/30/2015   Hypernatremia 12/28/2015   Tobacco abuse 12/09/2015   Gastritis 12/09/2015   Respiratory failure, acute (HCC)    Protein-calorie malnutrition, severe 12/04/2015   Encephalopathy acute 12/03/2015   Anxiety state 09/28/2015   Hyperlipidemia    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 09/25/2015 09/25/2015   Coronary atherosclerosis of native coronary artery 04/24/2013   Essential hypertension, benign 04/24/2013   Other and unspecified hyperlipidemia 04/24/2013   Central pterygium 10/30/2012   Far-sighted 10/30/2012   Dystrophy, Salzmann's nodular 10/30/2012   Cataract, nuclear sclerotic senile 10/30/2012   Danny Baton, MS, CCC-SLP Speech-Language Pathologist  Arlana Lindau 08/04/2016, 6:11 AM  [facility] [address] [place], [place],  [address] Phone: [phone]   Fax:  [phone]   Name: [name] MRN: [id number] Date of Birth: [birth year]

## 2016-08-19 ENCOUNTER — Ambulatory Visit: Payer: Medicare Other | Attending: Internal Medicine | Admitting: Rehabilitation

## 2016-08-19 ENCOUNTER — Encounter: Payer: Self-pay | Admitting: Rehabilitation

## 2016-08-19 ENCOUNTER — Ambulatory Visit: Payer: Medicare Other

## 2016-08-19 DIAGNOSIS — R1310 Dysphagia, unspecified: Secondary | ICD-10-CM | POA: Insufficient documentation

## 2016-08-19 DIAGNOSIS — R471 Dysarthria and anarthria: Secondary | ICD-10-CM | POA: Diagnosis present

## 2016-08-19 DIAGNOSIS — R2689 Other abnormalities of gait and mobility: Secondary | ICD-10-CM | POA: Diagnosis present

## 2016-08-19 DIAGNOSIS — M6281 Muscle weakness (generalized): Secondary | ICD-10-CM | POA: Diagnosis present

## 2016-08-19 DIAGNOSIS — R41841 Cognitive communication deficit: Secondary | ICD-10-CM | POA: Diagnosis present

## 2016-08-19 DIAGNOSIS — R2681 Unsteadiness on feet: Secondary | ICD-10-CM | POA: Insufficient documentation

## 2016-08-19 NOTE — Therapy (Signed)
[facility] [address] [place], [place], [address] Phone: [phone]   Fax:  [phone]  Speech Language Pathology Treatment  Patient Details  Name: [name] MRN: [id number] Date of Birth: [birth year] Referring Provider: [name], MD  Encounter Date: 08/19/2016      End of Session - 08/19/16 1752    Visit Number 5   Number of Visits 9   Date for SLP Re-Evaluation 09/02/16   SLP Start Time 1150   SLP Stop Time  1230   SLP Time Calculation (min) 40 min   Activity Tolerance Patient tolerated treatment well      Past Medical History:  Diagnosis Date  . 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 Aspiration pneumonia (HCC) 01/17/2016  . Bilateral dry eyes   . Bile peritonitis due to gastric tube dislodgement 12/03/2015  . BPH (benign prostatic hyperplasia)   . Central pterygium of right eye   . Chronic respiratory failure with hypoxia (HCC)   . Coronary artery disease 1997, 2009   bare mental stent right coronart artery, occlusive followed by Dr. Garnette Scheuermann in [place]  . Degenerative disc disease    LOWER BACK  . Dysphonia   . ED (erectile dysfunction)   . 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 Gastric outlet obstruction 11/24/2015  . Gastroesophageal reflux disease   . Hyperlipidemia   . Hyperopia of both eyes with astigmatism and presbyopia   . Hypertension   . Ischemic right index finger at tip 12/09/2015  . Nuclear senile cataract    [facility]. [facility] in [place]  . Peptic ulcer   . Pneumatosis of intestines 12/11/2015  . Pneumonia    HISTORY OF IN CHILDHOOD  . Posterior vitreous detachment 10/30/2012  . Pyloric stricture 11/24/2015  . Salzmann's nodular dystrophy of left eye   . SBO (small bowel obstruction) (HCC) 01/20/2016  . Urinary hesitancy   . Wears glasses     Past Surgical History:   Procedure Laterality Date  . CARDIAC CATHETERIZATION    . CHOLECYSTECTOMY    . ESOPHAGOGASTRODUODENOSCOPY N/A 12/01/2015   Procedure: ESOPHAGOGASTRODUODENOSCOPY (EGD) BALLOON DILATION OF DUODENAL STRICTURE;  Surgeon: Karie Soda, MD;  [facility];  Service: General;  Laterality: N/A;  . ESOPHAGOGASTRODUODENOSCOPY N/A 12/14/2015   Procedure: ESOPHAGOGASTRODUODENOSCOPY (EGD);  Surgeon: Vida Rigger, MD;  [facility];  Service: Endoscopy;  Laterality: N/A;  Patient has tracheostomy  . ESOPHAGOGASTRODUODENOSCOPY (EGD) WITH PROPOFOL N/A 07/01/2015   Procedure: ESOPHAGOGASTRODUODENOSCOPY (EGD) WITH PROPOFOL;  Surgeon: Willis Modena, MD;  [facility];  Service: Endoscopy;  Laterality: N/A;  . ESOPHAGOGASTRODUODENOSCOPY (EGD) WITH PROPOFOL N/A 11/23/2015   Procedure: ESOPHAGOGASTRODUODENOSCOPY (EGD) WITH PROPOFOL;  Surgeon: Willis Modena, MD;  [facility];  Service: Endoscopy;  Laterality: N/A;  may need to intubate  . EYE SURGERY     growth on right eye removed   . IR GENERIC HISTORICAL  12/13/2015   IR REPLC DUODEN/JEJUNO TUBE PERCUT W/FLUORO 12/13/2015 [name], MD [facility]  . IR GENERIC HISTORICAL  02/03/2016   IR REPLC GASTRO/COLONIC TUBE PERCUT W/FLUORO 02/03/2016 Berdine Dance, MD [facility]  . IR GENERIC HISTORICAL  02/04/2016   IR GASTR TUBE CONVERT GASTR-JEJ PER W/FL MOD SED 02/04/2016 Malachy Moan, MD [facility]  . IR GENERIC HISTORICAL  02/25/2016   IR SINUS/FIST TUBE CHK-NON GI 02/25/2016 Irish Lack, MD [facility]  . IR GENERIC HISTORICAL  03/18/2016   IR CM  INJ ANY COLONIC TUBE W/FLUORO 03/18/2016 Simonne Come, MD [facility]  . IR GENERIC HISTORICAL  03/18/2016   IR GJ TUBE CHANGE 03/18/2016 Simonne Come, MD [facility]  . IR GENERIC HISTORICAL  03/22/2016   IR CM INJ ANY COLONIC TUBE W/FLUORO 03/22/2016 Simonne Come, MD [facility]  . IR GENERIC HISTORICAL  04/13/2016   IR CM INJ ANY COLONIC TUBE W/FLUORO 04/13/2016 Richarda Overlie, MD  [facility]  . LAPAROSCOPIC GASTROSTOMY N/A 12/01/2015   Procedure: LAPAROSCOPIC PLACEMENT OF FEEDING JEJUNOSTOMY AND GASTROSTOMY TUBE;  Surgeon: Karie Soda, MD;  [facility];  Service: General;  Laterality: N/A;  . LAPAROSCOPY N/A 12/03/2015   Procedure: LAPAROSCOPY DIAGNOSTIC, OMENTOPEXY, JEJUNOSTOMY, WASH OUT;  Surgeon: Karie Soda, MD;  [facility];  Service: General;  Laterality: N/A;  . LAPAROTOMY N/A 02/06/2016   Procedure: EXPLORATORY LAPAROTOMY, LYSIS OF ADHESIONS, SEGMENTAL SMALL BOWEL RESECTION, GASTROJEJUNOSTOMY;  Surgeon: Chevis Pretty III, MD;  [facility];  Service: General;  Laterality: N/A;  . STENT TO HEART     2 1997 AND 1 IN 1996  . TONSILLECTOMY    . XI ROBOTIC VAGOTOMY AND ANTRECTOMY N/A 09/25/2015   Procedure: XI ROBOTIC ANTERIOR AND POSTERIOR VAGOTOMY, BILROTH I  ANASTOMOSIS DOR FUNDIPLICATION, OMENTOPEXY  UPPER ENDOSCOPY;  Surgeon: Karie Soda, MD;  [facility];  Service: General;  Laterality: N/A;    There were no vitals filed for this visit.      Subjective Assessment - 08/19/16 1153    Subjective "he coughs when he leans back in his chair with liquids."   Patient is accompained by: Family member  wife   Currently in Pain? No/denies               ADULT SLP TREATMENT - 08/19/16 1213      General Information   Behavior/Cognition Alert;Cooperative;Pleasant mood     Treatment Provided   Treatment provided Cognitive-Linquistic     Dysphagia Treatment   Other treatment/comments Discussed remaining upright during meals/liquid administration during the day as pt reclines and drinks. SLP used model of larynx/pharynx to demo why reclining and drinking is not advised.      Cognitive-Linquistic Treatment   Treatment focused on Dysarthria   Skilled Treatment Pt with approx 50% success with inhalation device at 18cmH2O. Had much difficulty with exhalation device with nasal emission and weakened VP muscualture complicating performance. Pt  admitted to some nasal regurgitation during meals. SLP reviewed intelligibility strategies for speech by reading phrases - pt req'd mod A usually in first 10/25 reps and then cues were faded to min A rarely as last 15 reps were completed. Pt with self-correction x2 and SLP made sure to comment that self correction will benefit pt in conversation especially when nonverbal listener cues are noticed.           SLP Education - 08/19/16 1752    Education provided Yes   Education Details look for cues that listener did not comprehend and repeat message when needed   Person(s) Educated Patient;Spouse   Methods Explanation   Comprehension Verbalized understanding          SLP Short Term Goals - 08/02/16 1535      SLP SHORT TERM GOAL #1   Title pt will complete HEP for dysarthria with rare min A over two sessions   Baseline 6.18.18   Time 2   Period Weeks   Status Revised     SLP SHORT TERM GOAL #2   Title pt will  use compensatory strategies for dysarthria 80% of the time in sentence level responses   Baseline phrase level 6.18.18   Time 2   Period Weeks   Status On-going     SLP SHORT TERM GOAL #3   Title Pt will participate in assessment of cognitive communication within first 2 sessions.   Status Achieved     SLP SHORT TERM GOAL #4   Title Pt will tell SLP 4 strategies to improve attention, memory.    Time 2   Period Weeks   Status On-going          SLP Long Term Goals - 08/19/16 1754      SLP LONG TERM GOAL #1   Title pt will demo HEP with modified independence over two sessions   Time 5   Period Weeks   Status On-going     SLP LONG TERM GOAL #2   Title Pt will use compensatory strategies for dysarthria to improve intelligiblity to 75% over 5 minutes mod complex conversation.   Time 5   Period Weeks   Status On-going     SLP LONG TERM GOAL #3   Title pt will tell SLP 3 s/s aspiration PNA with modified independence   Time 5   Period Weeks   Status On-going      SLP LONG TERM GOAL #4   Title Pt will report demo use of compensatory strategies for recall of daily activities, appointments or details from written materials in 2 therapy sessions.   Time 5   Period Weeks   Status On-going          Plan - 08/19/16 1753    Clinical Impression Statement Pt presents with ataxic dysarthria today. Inspiratory pressure and speech intelligiblity were targeted today. Recommend cont'd skilled ST  in order to improve intelligibility, maximize cognitive function, reduce risks for aspiration and to improve quality of life.   Speech Therapy Frequency 1x /week   Duration --  8 weeks   Treatment/Interventions Aspiration precaution training;Cognitive reorganization;SLP instruction and feedback;Oral motor exercises;Diet toleration management by SLP;Patient/family education;Functional tasks;Pharyngeal strengthening exercises;Environmental controls;Compensatory strategies   Potential to Achieve Goals Good      Patient will benefit from skilled therapeutic intervention in order to improve the following deficits and impairments:   Dysarthria and anarthria  Dysphagia, unspecified type  Cognitive communication deficit    Problem List Patient Active Problem List   Diagnosis Date Noted  . Delayed gastric emptying 04/22/2016  . Respiratory failure (HCC) 04/16/2016  . SOB (shortness of breath)   . NSVT (nonsustained ventricular tachycardia) (HCC)   . Elevated troponin 04/15/2016  . Acute on chronic diastolic heart failure (HCC)   . Gastric dysmotility   . Gastrojejunostomy tube 02/06/2016 03/10/2016  . Hyponatremia   . Anxiety and depression   . Sleep disturbance   . Orthostasis   . Leukocytosis   . Hyperglycemia   . FTT (failure to thrive) in adult   . Anemia of chronic disease 01/31/2016  . Stage 2 skin ulcer of sacral region (HCC) 01/31/2016  . Hypomagnesemia 01/21/2016  . Hypokalemia 01/21/2016  . Pressure injury of skin 01/20/2016  . Pleural  effusion   . Gastroesophageal reflux disease 01/19/2016  . Insomnia 01/19/2016  . Skin ulcer of abdominal wall, limited to breakdown of skin (HCC) 01/17/2016  . Dysphagia 01/17/2016  . Debility 01/17/2016  . 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 Vitamin B12 deficiency 01/02/2016  . BPH (benign prostatic hyperplasia)   . Posterior  vitreous detachment   . Salzmann's nodular dystrophy of left eye   . Hyperopia of both eyes with astigmatism and presbyopia   . Central pterygium of right eye   . Pressure ulcer of buttock, right, unstageable (HCC) 12/30/2015  . Hypernatremia 12/28/2015  . Tobacco abuse 12/09/2015  . Gastritis 12/09/2015  . Respiratory failure, acute (HCC)   . Protein-calorie malnutrition, severe 12/04/2015  . Encephalopathy acute 12/03/2015  . Anxiety state 09/28/2015  . Hyperlipidemia   . 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 Coronary atherosclerosis of native coronary artery 04/24/2013  . Essential hypertension, benign 04/24/2013  . Other and unspecified hyperlipidemia 04/24/2013  . Central pterygium 10/30/2012  . 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 Cataract, nuclear sclerotic senile 10/30/2012    [facility] ,MS, CCC-SLP  08/19/2016, 5:54 PM  [facility] [address] [place], [place], [address] Phone: [phone]   Fax:  [phone]   Name: JODI KAPPES MRN: [id number] Date of Birth: 1940-04-16

## 2016-08-19 NOTE — Therapy (Signed)
[facility] [address] [place], [place], [address] Phone: [phone]   Fax:  [phone]  Physical Therapy Treatment  Patient Details  Name: [name] MRN: [id number] Date of Birth: 1940-09-25 Referring Provider: Lavone Orn, MD  Encounter Date: 08/19/2016      PT End of Session - 08/19/16 1304    Visit Number 5   Number of Visits 9   Date for PT Re-Evaluation 09/05/16   Authorization Type [employer] MCR-G Code on every 10th visit   PT Start Time 1100   PT Stop Time 1148   PT Time Calculation (min) 48 min   Equipment Utilized During Treatment Gait belt   Activity Tolerance Patient tolerated treatment well   Behavior During Therapy [facility] for tasks assessed/performed      Past Medical History:  Diagnosis Date  . 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 Aspiration pneumonia ([place]) 01/17/2016  . Bilateral dry eyes   . Bile peritonitis due to gastric tube dislodgement 12/03/2015  . BPH (benign prostatic hyperplasia)   . Central pterygium of right eye   . Chronic respiratory failure with hypoxia ([place])   . Coronary artery disease 1997, 2009   bare mental stent right coronart artery, occlusive followed by Dr. Pernell Dupre in [place]  . Degenerative disc disease    LOWER BACK  . Dysphonia   . ED (erectile dysfunction)   . 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 Gastric outlet obstruction 11/24/2015  . Gastroesophageal reflux disease   . Hyperlipidemia   . Hyperopia of both eyes with astigmatism and presbyopia   . Hypertension   . Ischemic right index finger at tip 12/09/2015  . Nuclear senile cataract    Dr. [facility] in [place]  . Peptic ulcer   . Pneumatosis of intestines 12/11/2015  . Pneumonia    HISTORY OF IN CHILDHOOD  . Posterior vitreous detachment 10/30/2012  . Pyloric stricture 11/24/2015  . Salzmann's  nodular dystrophy of left eye   . SBO (small bowel obstruction) ([place]) 01/20/2016  . Urinary hesitancy   . Wears glasses     Past Surgical History:  Procedure Laterality Date  . CARDIAC CATHETERIZATION    . CHOLECYSTECTOMY    . ESOPHAGOGASTRODUODENOSCOPY N/A 12/01/2015   Procedure: ESOPHAGOGASTRODUODENOSCOPY (EGD) BALLOON DILATION OF DUODENAL STRICTURE;  Surgeon: Michael Boston, MD;  Location: [facility];  Service: General;  Laterality: N/A;  . ESOPHAGOGASTRODUODENOSCOPY N/A 12/14/2015   Procedure: ESOPHAGOGASTRODUODENOSCOPY (EGD);  Surgeon: Clarene Essex, MD;  Location: [facility];  Service: Endoscopy;  Laterality: N/A;  Patient has tracheostomy  . ESOPHAGOGASTRODUODENOSCOPY (EGD) WITH PROPOFOL N/A 07/01/2015   Procedure: ESOPHAGOGASTRODUODENOSCOPY (EGD) WITH PROPOFOL;  Surgeon: Arta Silence, MD;  Location: [facility];  Service: Endoscopy;  Laterality: N/A;  . ESOPHAGOGASTRODUODENOSCOPY (EGD) WITH PROPOFOL N/A 11/23/2015   Procedure: ESOPHAGOGASTRODUODENOSCOPY (EGD) WITH PROPOFOL;  Surgeon: Arta Silence, MD;  Location: [facility];  Service: Endoscopy;  Laterality: N/A;  may need to intubate  . EYE SURGERY     growth on right eye removed   . IR GENERIC HISTORICAL  12/13/2015   IR REPLC DUODEN/JEJUNO TUBE PERCUT W/FLUORO 12/13/2015 Marybelle Killings, MD [facility]  . IR GENERIC HISTORICAL  02/03/2016   IR [place] GASTRO/COLONIC TUBE PERCUT W/FLUORO 02/03/2016 Greggory Keen, MD [facility]  . IR GENERIC HISTORICAL  02/04/2016   IR GASTR TUBE CONVERT GASTR-JEJ PER W/FL MOD SED 02/04/2016 Jacqulynn Cadet, MD [facility]  . IR GENERIC  HISTORICAL  02/25/2016   IR SINUS/FIST TUBE CHK-NON GI 02/25/2016 Aletta Edouard, MD [facility]  . IR GENERIC HISTORICAL  03/18/2016   IR CM INJ ANY COLONIC TUBE W/FLUORO 03/18/2016 Sandi Mariscal, MD [facility]  . IR GENERIC HISTORICAL  03/18/2016   IR GJ TUBE CHANGE 03/18/2016 Sandi Mariscal, MD [facility]  . IR GENERIC HISTORICAL  03/22/2016   IR CM INJ ANY COLONIC  TUBE W/FLUORO 03/22/2016 Sandi Mariscal, MD [facility]  . IR GENERIC HISTORICAL  04/13/2016   IR CM INJ ANY COLONIC TUBE W/FLUORO 04/13/2016 Markus Daft, MD [facility]  . LAPAROSCOPIC GASTROSTOMY N/A 12/01/2015   Procedure: LAPAROSCOPIC PLACEMENT OF FEEDING JEJUNOSTOMY AND GASTROSTOMY TUBE;  Surgeon: Michael Boston, MD;  Location[facility];  Service: General;  Laterality: N/A;  . LAPAROSCOPY N/A 12/03/2015   Procedure: LAPAROSCOPY DIAGNOSTIC, OMENTOPEXY, JEJUNOSTOMY, [place] OUT;  Surgeon: Michael Boston, MD;  Location[facility];  Service: General;  Laterality: N/A;  . LAPAROTOMY N/A 02/06/2016   Procedure: EXPLORATORY LAPAROTOMY, LYSIS OF ADHESIONS, SEGMENTAL SMALL BOWEL RESECTION, GASTROJEJUNOSTOMY;  Surgeon: Autumn Messing III, MD;  Location: [facility];  Service: General;  Laterality: N/A;  . STENT TO HEART     2 1997 AND 1 IN 1996  . TONSILLECTOMY    . XI ROBOTIC VAGOTOMY AND ANTRECTOMY N/A 09/25/2015   Procedure: XI ROBOTIC ANTERIOR AND POSTERIOR VAGOTOMY, BILROTH I  ANASTOMOSIS [place], OMENTOPEXY  UPPER ENDOSCOPY;  Surgeon: Michael Boston, MD;  Location: [facility];  Service: General;  Laterality: N/A;    There were no vitals filed for this visit.      Subjective Assessment - 08/19/16 1104    Subjective No falls, no complaints, no changes to report.    Patient is accompained by: Family member   Pertinent History History of several GI surgeries, dementia, swallowing/speech difficulties   Limitations House hold activities;Walking;Standing   Patient Stated Goals "I want to be able to walk."    Currently in Pain? No/denies            [facility] PT Assessment - 08/19/16 1108      6 Minute Walk- Baseline   6 Minute Walk- Baseline yes   BP (mmHg) 104/66   HR (bpm) 76   02 Sat (%RA) 94 %   Modified Borg Scale for Dyspnea 0- Nothing at all   Perceived Rate of Exertion (Borg) 7- Very, very light     6 Minute walk- Post Test   6 Minute Walk Post Test yes   BP (mmHg) 128/65   HR (bpm) 87   02 Sat  (%RA) 96 %   Modified Borg Scale for Dyspnea 4- somewhat severe   Perceived Rate of Exertion (Borg) 12-     6 minute walk test results    Aerobic Endurance Distance Walked 830   Endurance additional comments with SPC, no LOB                     [facility] Adult PT Treatment/Exercise - 08/19/16 1124      Transfers   Five time sit to stand comments  17.25 secs with single UE support on arm chair     Ambulation/Gait   Gait velocity 3.31 ft/sec w/o cane     Standardized Balance Assessment   Standardized Balance Assessment Berg Balance Test     Berg Balance Test   Sit to Stand Able to stand without using hands and stabilize independently   Standing Unsupported Able to stand safely 2 minutes  Sitting with Back Unsupported but Feet Supported on Floor or Stool Able to sit safely and securely 2 minutes   Stand to Sit Controls descent by using hands   Transfers Able to transfer safely, definite need of hands   Standing Unsupported with Eyes Closed Able to stand 10 seconds safely   Standing Ubsupported with Feet Together Able to place feet together independently and stand 1 minute safely   From Standing, Reach Forward with Outstretched Arm Can reach confidently >25 cm (10")   From Standing Position, Pick up Object from Floor Able to pick up shoe safely and easily   From Standing Position, Turn to Look Behind Over each Shoulder Looks behind from both sides and weight shifts well   Turn 360 Degrees Able to turn 360 degrees safely but slowly   Standing Unsupported, Alternately Place Feet on Step/Stool Able to complete 4 steps without aid or supervision   Standing Unsupported, One Foot in Front Able to plae foot ahead of the other independently and hold 30 seconds   Standing on One Leg Able to lift leg independently and hold equal to or more than 3 seconds   Total Score 47                PT Education - 08/19/16 1106    Education provided Yes   Education Details progress  towards LTGs.    Person(s) Educated Patient;Spouse   Methods Explanation   Comprehension Verbalized understanding          PT Short Term Goals - 08/19/16 1106      PT SHORT TERM GOAL #1   Title Pt/family/caregiver will be independent with initial HEP in order to indicate improved functional mobility and decreased fall risk.  (Target Date: 08/06/16)   Status New     PT SHORT TERM GOAL #2   Title Pt will improve BERG balance score to 41/56 in order to indicate decreased fall risk.     Baseline 47/56 on 08/19/16   Status Achieved     PT SHORT TERM GOAL #3   Title Pt will perform 5TSS in <17 secs with single UE support in order to indicate improved fuctional strength and decreased fall risk.     Baseline 17.25 secs with single UE support 08/19/16   Status Partially Met     PT SHORT TERM GOAL #4   Title Pt will improve gait speed to 2.21 ft/sec w/ LRAD in order to indicate improved efficiency of gait and decreased fall risk.     Baseline 3.31 ft/sec on 08/19/16   Status Achieved     PT SHORT TERM GOAL #5   Title Will assess 6MWT and improve distance by 75' from baseline in order to indicate improved functional endurance.     Baseline 662' at baseline to 830' on 08/19/16   Status Achieved           PT Long Term Goals - 08/19/16 1137      PT LONG TERM GOAL #1   Title Pt/wife/caregiver will be independent with final HEP in order to indicate continued fitness and decreased fall risk.  (Target Date: 09/05/16)   Status New     PT LONG TERM GOAL #2   Title Pt will improve BERG balance score to >/=51//56 in order to indicate decreased fall risk.     Baseline 47/56 on 08/19/16   Status Revised     PT LONG TERM GOAL #3   Title Pt will perform 5TSS in </=15  secs without UE support in order to indicate decreased fall risk and improved functional strength.     Baseline --   Status New     PT LONG TERM GOAL #4   Title Pt will improve gait speed to >/=2.62 w/ LRAD in order to indicate  improved efficiency of gait and decreased fall risk.     Baseline 3.31 ft/sec on 08/19/16   Status Achieved     PT LONG TERM GOAL #5   Title Pt will ambulate up to 500' over unlevel paved outdoor surfaces w/ LRAD at S level in order to indicate return to community negotiation.     Status New     PT LONG TERM GOAL #6   Title Pt will improve 6MWT by 300' from baseline in order to indicate improved functional endurance.     Baseline 168' improvement at STG   Status Revised               Plan - 08/19/16 1304    Clinical Impression Statement Skilled session began to look at [place].  Pt making excellent progress and has met 3/5 STGs partially meeting 4th STG for 5TSS.  Will check HEP at next session.  Pt has also met 2 LTGs that will be updated based on progress.     Rehab Potential Good   PT Frequency 1x / week  due to high copay, would like to see 2x/wk   PT Duration 8 weeks   PT Treatment/Interventions ADLs/Self Care Home Management;Electrical Stimulation;DME Instruction;Gait training;Stair training;Functional mobility training;Therapeutic activities;Therapeutic exercise;Balance training;Neuromuscular re-education;Patient/family education;Orthotic Fit/Training;Passive range of motion;Vestibular   PT Next Visit Plan check HEP and add to address STGs, work on gait speed with improved quality of gait, increased stride length;continue to work on balance. advance/add to HEP as needed   Consulted and Agree with Plan of Care Patient;Family member/caregiver   Family Member Consulted wife and caregiver      Patient will benefit from skilled therapeutic intervention in order to improve the following deficits and impairments:  Abnormal gait, Decreased activity tolerance, Decreased balance, Decreased cognition, Decreased endurance, Decreased knowledge of use of DME, Decreased mobility, Decreased range of motion, Decreased strength, Impaired perceived functional ability, Impaired flexibility, Postural  dysfunction  Visit Diagnosis: Unsteadiness on feet  Muscle weakness (generalized)  Other abnormalities of gait and mobility     Problem List Patient Active Problem List   Diagnosis Date Noted  . Delayed gastric emptying 04/22/2016  . Respiratory failure ([place]) 04/16/2016  . SOB (shortness of breath)   . NSVT (nonsustained ventricular tachycardia) ([place])   . Elevated troponin 04/15/2016  . Acute on chronic diastolic heart failure ([place])   . Gastric dysmotility   . Gastrojejunostomy tube 02/06/2016 03/10/2016  . Hyponatremia   . Anxiety and depression   . Sleep disturbance   . Orthostasis   . Leukocytosis   . Hyperglycemia   . FTT (failure to thrive) in adult   . Anemia of chronic disease 01/31/2016  . Stage 2 skin ulcer of sacral region ([place]) 01/31/2016  . Hypomagnesemia 01/21/2016  . Hypokalemia 01/21/2016  . Pressure injury of skin 01/20/2016  . Pleural effusion   . Gastroesophageal reflux disease 01/19/2016  . Insomnia 01/19/2016  . Skin ulcer of abdominal wall, limited to breakdown of skin ([place]) 01/17/2016  . Dysphagia 01/17/2016  . Debility 01/17/2016  . 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 Vitamin B12 deficiency 01/02/2016  . BPH (benign prostatic hyperplasia)   . Posterior vitreous detachment   .  Salzmann's nodular dystrophy of left eye   . Hyperopia of both eyes with astigmatism and presbyopia   . Central pterygium of right eye   . Pressure ulcer of buttock, right, unstageable ([place]) 12/30/2015  . Hypernatremia 12/28/2015  . Tobacco abuse 12/09/2015  . Gastritis 12/09/2015  . Respiratory failure, acute ([place])   . Protein-calorie malnutrition, severe 12/04/2015  . Encephalopathy acute 12/03/2015  . Anxiety state 09/28/2015  . Hyperlipidemia   . 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 Coronary atherosclerosis of native coronary artery 04/24/2013  . Essential hypertension, benign 04/24/2013  . Other  and unspecified hyperlipidemia 04/24/2013  . Central pterygium 10/30/2012  . 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 Cataract, nuclear sclerotic senile 10/30/2012    Cameron Sprang, PT, MPT [facility] [address] [place], [place], [address] Phone: [phone]   Fax:  [phone] 08/19/16, 1:09 PM  Name: [name] MRN: [id number] Date of Birth: 1940-08-18

## 2016-08-25 ENCOUNTER — Ambulatory Visit: Payer: Medicare Other | Admitting: Speech Pathology

## 2016-08-25 ENCOUNTER — Ambulatory Visit: Payer: Medicare Other | Admitting: Rehabilitation

## 2016-08-25 ENCOUNTER — Encounter: Payer: Self-pay | Admitting: Rehabilitation

## 2016-08-25 DIAGNOSIS — R2689 Other abnormalities of gait and mobility: Secondary | ICD-10-CM

## 2016-08-25 DIAGNOSIS — R1310 Dysphagia, unspecified: Secondary | ICD-10-CM

## 2016-08-25 DIAGNOSIS — R2681 Unsteadiness on feet: Secondary | ICD-10-CM

## 2016-08-25 DIAGNOSIS — R41841 Cognitive communication deficit: Secondary | ICD-10-CM

## 2016-08-25 DIAGNOSIS — M6281 Muscle weakness (generalized): Secondary | ICD-10-CM

## 2016-08-25 DIAGNOSIS — R471 Dysarthria and anarthria: Secondary | ICD-10-CM

## 2016-08-25 NOTE — Patient Instructions (Signed)
Functional Quadriceps: Sit to Stand    Sit on edge of chair, feet flat on floor. Stand upright, extending knees fully. Use hands on knees as needed.  Once you are standing, stretch arms out by your side to stretch your chest and stand as tall as you can.  Repeat 10 times per set. Do _1_ sets per session. Do _1-2_ sessions per day.  http://orth.exer.us/734  Copyright  VHI. All rights reserved.  Perform these at the counter top for balance assist as needed:  ."I love a Parade" Lift   At counter for balance as needed: high knee marching forward and then backward. 3 second pauses with each knee lift.  Repeat 3 laps each way. Do _1-2_ sessions per day. http://gt2.exer.us/345  Copyright  VHI. All rights reserved. Feet Heel-Toe "Tandem"   At counter: Arms at sides, walk a straight line forward bringing one foot directly in front of the other, and then a straight line backwards bringing one foot directly behind the other one.  Repeat for _3 laps each way. Do _1-2_ sessions per day.  Feet Together (Compliant Surface) Varied Arm Positions - Eyes Closed    Stand on compliant surface: __pillow or cushion______ with feet together and arms by your side.  Close eyes and visualize upright position. Hold__30__ seconds. Repeat __3__ times per session. Do __1-2__ sessions per day.  Copyright  VHI. All rights reserved.   Feet Together (Compliant Surface) Head Motion - Eyes Closed    Stand on compliant surface: __pillow or cushion______ with feet together. Close eyes and move head slowly, up and down x 10 reps, side to side x 10 reps, and diagonally in both directions x 10 reps.  Repeat __1__ times per session. Do __1__ sessions per day.  Copyright  VHI. All rights reserved.

## 2016-08-25 NOTE — Therapy (Signed)
[facility] [address] [place], [place], [address] Phone: [phone]   Fax:  [phone]  Physical Therapy Treatment  Patient Details  Name: [name] MRN: [id number] Date of Birth: 22-Dec-1940 Referring Provider: Lavone Orn, MD  Encounter Date: 08/25/2016      PT End of Session - 08/25/16 1427    Visit Number 6   Number of Visits 9   Date for PT Re-Evaluation 09/05/16   Authorization Type [employer] MCR-G Code on every 10th visit   PT Start Time 1403   PT Stop Time 1446   PT Time Calculation (min) 43 min   Equipment Utilized During Treatment Gait belt   Activity Tolerance Patient tolerated treatment well   Behavior During Therapy [facility] for tasks assessed/performed      Past Medical History:  Diagnosis Date  . 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 Aspiration pneumonia ([place]) 01/17/2016  . Bilateral dry eyes   . Bile peritonitis due to gastric tube dislodgement 12/03/2015  . BPH (benign prostatic hyperplasia)   . Central pterygium of right eye   . Chronic respiratory failure with hypoxia ([place])   . Coronary artery disease 1997, 2009   bare mental stent right coronart artery, occlusive followed by Dr. Pernell Dupre in [place]  . Degenerative disc disease    LOWER BACK  . Dysphonia   . ED (erectile dysfunction)   . 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 Gastric outlet obstruction 11/24/2015  . Gastroesophageal reflux disease   . Hyperlipidemia   . Hyperopia of both eyes with astigmatism and presbyopia   . Hypertension   . Ischemic right index finger at tip 12/09/2015  . Nuclear senile cataract    Dr. [facility] in [place]  . Peptic ulcer   . Pneumatosis of intestines 12/11/2015  . Pneumonia    HISTORY OF IN CHILDHOOD  . Posterior vitreous detachment 10/30/2012  . Pyloric stricture 11/24/2015  . Salzmann's  nodular dystrophy of left eye   . SBO (small bowel obstruction) ([place]) 01/20/2016  . Urinary hesitancy   . Wears glasses     Past Surgical History:  Procedure Laterality Date  . CARDIAC CATHETERIZATION    . CHOLECYSTECTOMY    . ESOPHAGOGASTRODUODENOSCOPY N/A 12/01/2015   Procedure: ESOPHAGOGASTRODUODENOSCOPY (EGD) BALLOON DILATION OF DUODENAL STRICTURE;  Surgeon: Michael Boston, MD;  Location: [facility];  Service: General;  Laterality: N/A;  . ESOPHAGOGASTRODUODENOSCOPY N/A 12/14/2015   Procedure: ESOPHAGOGASTRODUODENOSCOPY (EGD);  Surgeon: Clarene Essex, MD;  Location: [facility];  Service: Endoscopy;  Laterality: N/A;  Patient has tracheostomy  . ESOPHAGOGASTRODUODENOSCOPY (EGD) WITH PROPOFOL N/A 07/01/2015   Procedure: ESOPHAGOGASTRODUODENOSCOPY (EGD) WITH PROPOFOL;  Surgeon: Arta Silence, MD;  Location: [facility];  Service: Endoscopy;  Laterality: N/A;  . ESOPHAGOGASTRODUODENOSCOPY (EGD) WITH PROPOFOL N/A 11/23/2015   Procedure: ESOPHAGOGASTRODUODENOSCOPY (EGD) WITH PROPOFOL;  Surgeon: Arta Silence, MD;  [facility]: [facility] END[facility];  Service: Endoscopy;  Laterality: N/A;  may need to intubate  . EYE SURGERY     growth on right eye removed   . IR GENERIC HISTORICAL  12/13/2015   IR REPLC DUODEN/JEJUNO TUBE PERCUT W/FLUORO 12/13/2015 Marybelle Killings, MD [facility]  . IR GENERIC HISTORICAL  02/03/2016   IR [place] GASTRO/COLONIC TUBE PERCUT W/FLUORO 02/03/2016 Greggory Keen, MD [facility]  . IR GENERIC HISTORICAL  02/04/2016   IR GASTR TUBE CONVERT GASTR-JEJ PER W/FL MOD SED 02/04/2016 Jacqulynn Cadet, MD [facility]  . IR GENERIC  HISTORICAL  02/25/2016   IR SINUS/FIST TUBE CHK-NON GI 02/25/2016 Aletta Edouard, MD [facility]  . IR GENERIC HISTORICAL  03/18/2016   IR CM INJ ANY COLONIC TUBE W/FLUORO 03/18/2016 Sandi Mariscal, MD [facility]  . IR GENERIC HISTORICAL  03/18/2016   IR GJ TUBE CHANGE 03/18/2016 Sandi Mariscal, MD [facility]  . IR GENERIC HISTORICAL  03/22/2016   IR CM INJ ANY COLONIC  TUBE W/FLUORO 03/22/2016 Sandi Mariscal, MD [facility]  . IR GENERIC HISTORICAL  04/13/2016   IR CM INJ ANY COLONIC TUBE W/FLUORO 04/13/2016 Markus Daft, MD [facility]  . LAPAROSCOPIC GASTROSTOMY N/A 12/01/2015   Procedure: LAPAROSCOPIC PLACEMENT OF FEEDING JEJUNOSTOMY AND GASTROSTOMY TUBE;  Surgeon: Michael Boston, MD;  Location: [facility];  Service: General;  Laterality: N/A;  . LAPAROSCOPY N/A 12/03/2015   Procedure: LAPAROSCOPY DIAGNOSTIC, OMENTOPEXY, JEJUNOSTOMY, [place] OUT;  Surgeon: Michael Boston, MD;  [facility];  Service: General;  Laterality: N/A;  . LAPAROTOMY N/A 02/06/2016   Procedure: EXPLORATORY LAPAROTOMY, LYSIS OF ADHESIONS, SEGMENTAL SMALL BOWEL RESECTION, GASTROJEJUNOSTOMY;  Surgeon: Autumn Messing III, MD;  Location: [facility];  Service: General;  Laterality: N/A;  . STENT TO HEART     2 1997 AND 1 IN 1996  . TONSILLECTOMY    . XI ROBOTIC VAGOTOMY AND ANTRECTOMY N/A 09/25/2015   Procedure: XI ROBOTIC ANTERIOR AND POSTERIOR VAGOTOMY, BILROTH I  ANASTOMOSIS [place], OMENTOPEXY  UPPER ENDOSCOPY;  Surgeon: Michael Boston, MD;  Location: [facility];  Service: General;  Laterality: N/A;    There were no vitals filed for this visit.      Subjective Assessment - 08/25/16 1405    Subjective Pt reports no falls, no complaints.    Patient is accompained by: Family member   Pertinent History History of several GI surgeries, dementia, swallowing/speech difficulties   Limitations House hold activities;Walking;Standing   Patient Stated Goals "I want to be able to walk."    Currently in Pain? No/denies                         [facility] Adult PT Treatment/Exercise - 08/25/16 1428      Transfers   Transfers Sit to Stand;Stand to Sit   Sit to Stand 6: Modified independent (Device/Increase time)   Stand to Sit 6: Modified independent (Device/Increase time)   Comments Pt demonstrated x 5 reps today to go over HEP.  Pt did much better recalling scooting forward and increasing  forward weight shift today.       Ambulation/Gait   Ambulation/Gait Yes   Ambulation/Gait Assistance 5: Supervision;4: Min guard   Ambulation/Gait Assistance Details Assessed gait over indoor and outdoor surfaces without AD.  Note that over indoor surfaces, pt safe to ambulate at S level.  When assessed outdoor gait, esp over grass, recommend he continue to use cane and have S at all times.  Recommend he begin walking on grass some at home when doing walking program to challenge balance and endurance as our short walk made him moderately fatigued.     Ambulation Distance (Feet) 200 Feet  indoors and another 400' outdoors    Assistive device None   Gait Pattern Step-through pattern;Decreased stride length;Right flexed knee in stance;Left flexed knee in stance;Trunk flexed   Ambulation Surface Level;Unlevel;Indoor;Outdoor;Paved;Grass   Stairs Yes   Stairs Assistance 6: Modified independent (Device/Increase time)   Stair Management Technique One rail Right;Alternating pattern;Forwards   Number of Stairs 4  x 2 reps  Height of Stairs 6   Ramp 5: Supervision   Curb 5: Supervision   Curb Details (indicate cue type and reason) Min cues for stepping through at top and bottom of curb step.      Neuro Re-ed    Neuro Re-ed Details  Assessed balance with corner exercises as follows; feet apart, EC with head turns>feet togther EC with head turns x 10 reps in each direction.  Note no sway or difficulty, therefore had pt stand on compliant surface and perform feet apart EC x 30 secs, feet apart EC with head turns x 10 in each direction.  Again note this was providing no increase in challenge,therefore added two additional corner exercises, see pt instruction for details on exercises and reps performed.                 PT Education - 08/25/16 1443    Education provided Yes   Education Details additional HEP exercises   Person(s) Educated Patient;Spouse   Methods  Explanation;Demonstration;Handout   Comprehension Verbalized understanding;Returned demonstration          PT Short Term Goals - 08/25/16 1443      PT SHORT TERM GOAL #1   Title Pt/family/caregiver will be independent with initial HEP in order to indicate improved functional mobility and decreased fall risk.  (Target Date: 08/06/16)   Baseline met 08/25/16   Status Achieved     PT SHORT TERM GOAL #2   Title Pt will improve BERG balance score to 41/56 in order to indicate decreased fall risk.     Baseline 47/56 on 08/19/16   Status Achieved     PT SHORT TERM GOAL #3   Title Pt will perform 5TSS in <17 secs with single UE support in order to indicate improved fuctional strength and decreased fall risk.     Baseline 17.25 secs with single UE support 08/19/16   Status Partially Met     PT SHORT TERM GOAL #4   Title Pt will improve gait speed to 2.21 ft/sec w/ LRAD in order to indicate improved efficiency of gait and decreased fall risk.     Baseline 3.31 ft/sec on 08/19/16   Status Achieved     PT SHORT TERM GOAL #5   Title Will assess 6MWT and improve distance by 75' from baseline in order to indicate improved functional endurance.     Baseline 662' at baseline to 830' on 08/19/16   Status Achieved           PT Long Term Goals - 08/19/16 1137      PT LONG TERM GOAL #1   Title Pt/wife/caregiver will be independent with final HEP in order to indicate continued fitness and decreased fall risk.  (Target Date: 09/05/16)   Status New     PT LONG TERM GOAL #2   Title Pt will improve BERG balance score to >/=51//56 in order to indicate decreased fall risk.     Baseline 47/56 on 08/19/16   Status Revised     PT LONG TERM GOAL #3   Title Pt will perform 5TSS in </=15 secs without UE support in order to indicate decreased fall risk and improved functional strength.     Baseline --   Status New     PT LONG TERM GOAL #4   Title Pt will improve gait speed to >/=2.62 w/ LRAD in order to  indicate improved efficiency of gait and decreased fall risk.     Baseline 3.31 ft/sec  on 08/19/16   Status Achieved     PT LONG TERM GOAL #5   Title Pt will ambulate up to 500' over unlevel paved outdoor surfaces w/ LRAD at S level in order to indicate return to community negotiation.     Status New     PT LONG TERM GOAL #6   Title Pt will improve 6MWT by 300' from baseline in order to indicate improved functional endurance.     Baseline 168' improvement at STG   Status Revised               Plan - 08/25/16 1919    Clinical Impression Statement Pt met final STG for HEP during this session.  Discussed further community fitness during session.  Note that wife attends [employer] classes 1x/wk and therefore would like to have pt attend these for the next two weeks and bring any concerns to therapy in anticipation for D/C towards end of month.  Pt and wife verbalized understanding.  Updated HEP during session and also assess outdoor gait.  Pt making good progress towards goals.     Rehab Potential Good   PT Frequency 1x / week  due to high copay, would like to see 2x/wk   PT Duration 8 weeks   PT Treatment/Interventions ADLs/Self Care Home Management;Electrical Stimulation;DME Instruction;Gait training;Stair training;Functional mobility training;Therapeutic activities;Therapeutic exercise;Balance training;Neuromuscular re-education;Patient/family education;Orthotic Fit/Training;Passive range of motion;Vestibular   PT Next Visit Plan work on gait speed with improved quality of gait, increased stride length;continue to work on balance. advance/add to HEP as needed, gait outdoors with [facility]   Consulted and Agree with Plan of Care Patient;Family member/caregiver   Family Member Consulted wife       Patient will benefit from skilled therapeutic intervention in order to improve the following deficits and impairments:  Abnormal gait, Decreased activity tolerance, Decreased balance, Decreased cognition,  Decreased endurance, Decreased knowledge of use of DME, Decreased mobility, Decreased range of motion, Decreased strength, Impaired perceived functional ability, Impaired flexibility, Postural dysfunction  Visit Diagnosis: Unsteadiness on feet  Muscle weakness (generalized)  Other abnormalities of gait and mobility     Problem List Patient Active Problem List   Diagnosis Date Noted  . Delayed gastric emptying 04/22/2016  . Respiratory failure ([place]) 04/16/2016  . SOB (shortness of breath)   . NSVT (nonsustained ventricular tachycardia) ([place])   . Elevated troponin 04/15/2016  . Acute on chronic diastolic heart failure ([place])   . Gastric dysmotility   . Gastrojejunostomy tube 02/06/2016 03/10/2016  . Hyponatremia   . Anxiety and depression   . Sleep disturbance   . Orthostasis   . Leukocytosis   . Hyperglycemia   . FTT (failure to thrive) in adult   . Anemia of chronic disease 01/31/2016  . Stage 2 skin ulcer of sacral region ([place]) 01/31/2016  . Hypomagnesemia 01/21/2016  . Hypokalemia 01/21/2016  . Pressure injury of skin 01/20/2016  . Pleural effusion   . Gastroesophageal reflux disease 01/19/2016  . Insomnia 01/19/2016  . Skin ulcer of abdominal wall, limited to breakdown of skin ([place]) 01/17/2016  . Dysphagia 01/17/2016  . Debility 01/17/2016  . 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 Vitamin B12 deficiency 01/02/2016  . BPH (benign prostatic hyperplasia)   . Posterior vitreous detachment   . Salzmann's nodular dystrophy of left eye   . Hyperopia of both eyes with astigmatism and presbyopia   . Central pterygium of right eye   . Pressure ulcer of buttock, right, unstageable ([place]) 12/30/2015  .  Hypernatremia 12/28/2015  . Tobacco abuse 12/09/2015  . Gastritis 12/09/2015  . Respiratory failure, acute ([place])   . Protein-calorie malnutrition, severe 12/04/2015  . Encephalopathy acute 12/03/2015  . Anxiety state 09/28/2015  . Hyperlipidemia   . Acquired  stricture of pylorus s/p vagatomy & distal gastrectomy 09/25/2015 09/25/2015  . Coronary atherosclerosis of native coronary artery 04/24/2013  . Essential hypertension, benign 04/24/2013  . Other and unspecified hyperlipidemia 04/24/2013  . Central pterygium 10/30/2012  . 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 Cataract, nuclear sclerotic senile 10/30/2012    Cameron Sprang, PT, MPT [facility] [address] [place], [place], [address] Phone: [phone]   Fax:  [phone] 08/25/16, 7:24 PM  Name: [name] MRN: [id number] Date of Birth: [birth year]

## 2016-08-25 NOTE — Therapy (Signed)
[facility] [address] [place], [place], [address] Phone: [phone]   Fax:  [phone]  Speech Language Pathology Treatment  Patient Details  Name: [name] MRN: [id number] Date of Birth: [birth year] Referring Provider: Lavone Orn, MD  Encounter Date: 08/25/2016      End of Session - 08/25/16 1832    Visit Number 6   Number of Visits 9   Date for SLP Re-Evaluation 09/02/16   SLP Start Time 6962   SLP Stop Time  1533   SLP Time Calculation (min) 44 min   Activity Tolerance Patient tolerated treatment well      Past Medical History:  Diagnosis Date  . 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 Aspiration pneumonia ([place]) 01/17/2016  . Bilateral dry eyes   . Bile peritonitis due to gastric tube dislodgement 12/03/2015  . BPH (benign prostatic hyperplasia)   . Central pterygium of right eye   . Chronic respiratory failure with hypoxia ([place])   . Coronary artery disease 1997, 2009   bare mental stent right coronart artery, occlusive followed by Dr. Pernell Dupre in [place]  . Degenerative disc disease    LOWER BACK  . Dysphonia   . ED (erectile dysfunction)   . 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 Gastric outlet obstruction 11/24/2015  . Gastroesophageal reflux disease   . Hyperlipidemia   . Hyperopia of both eyes with astigmatism and presbyopia   . Hypertension   . Ischemic right index finger at tip 12/09/2015  . Nuclear senile cataract    Dr. [facility] in [place]  . Peptic ulcer   . Pneumatosis of intestines 12/11/2015  . Pneumonia    HISTORY OF IN CHILDHOOD  . Posterior vitreous detachment 10/30/2012  . Pyloric stricture 11/24/2015  . Salzmann's nodular dystrophy of left eye   . SBO (small bowel obstruction) ([place]) 01/20/2016  . Urinary hesitancy   . Wears glasses     Past Surgical History:   Procedure Laterality Date  . CARDIAC CATHETERIZATION    . CHOLECYSTECTOMY    . ESOPHAGOGASTRODUODENOSCOPY N/A 12/01/2015   Procedure: ESOPHAGOGASTRODUODENOSCOPY (EGD) BALLOON DILATION OF DUODENAL STRICTURE;  Surgeon: Michael Boston, MD;  Location: [facility];  Service: General;  Laterality: N/A;  . ESOPHAGOGASTRODUODENOSCOPY N/A 12/14/2015   Procedure: ESOPHAGOGASTRODUODENOSCOPY (EGD);  Surgeon: Clarene Essex, MD;  Location: [facility];  Service: Endoscopy;  Laterality: N/A;  Patient has tracheostomy  . ESOPHAGOGASTRODUODENOSCOPY (EGD) WITH PROPOFOL N/A 07/01/2015   Procedure: ESOPHAGOGASTRODUODENOSCOPY (EGD) WITH PROPOFOL;  Surgeon: Arta Silence, MD;  Location: [facility];  Service: Endoscopy;  Laterality: N/A;  . ESOPHAGOGASTRODUODENOSCOPY (EGD) WITH PROPOFOL N/A 11/23/2015   Procedure: ESOPHAGOGASTRODUODENOSCOPY (EGD) WITH PROPOFOL;  Surgeon: Arta Silence, MD;  Location: [facility] END[facility]COPY;  Service: Endoscopy;  Laterality: N/A;  may need to intubate  . EYE SURGERY     growth on right eye removed   . IR GENERIC HISTORICAL  12/13/2015   IR REPLC DUODEN/JEJUNO TUBE PERCUT W/FLUORO 12/13/2015 Marybelle Killings, MD [facility]  . IR GENERIC HISTORICAL  02/03/2016   IR [place] GASTRO/COLONIC TUBE PERCUT W/FLUORO 02/03/2016 Greggory Keen, MD [facility]  . IR GENERIC HISTORICAL  02/04/2016   IR GASTR TUBE CONVERT GASTR-JEJ PER W/FL MOD SED 02/04/2016 Jacqulynn Cadet, MD [facility]  . IR GENERIC HISTORICAL  02/25/2016   IR SINUS/FIST TUBE CHK-NON GI 02/25/2016 Aletta Edouard, MD [facility]  . IR GENERIC HISTORICAL  03/18/2016   IR CM  INJ ANY COLONIC TUBE W/FLUORO 03/18/2016 Sandi Mariscal, MD [facility]  . IR GENERIC HISTORICAL  03/18/2016   IR GJ TUBE CHANGE 03/18/2016 Sandi Mariscal, MD [facility]  . IR GENERIC HISTORICAL  03/22/2016   IR CM INJ ANY COLONIC TUBE W/FLUORO 03/22/2016 Sandi Mariscal, MD [facility]  . IR GENERIC HISTORICAL  04/13/2016   IR CM INJ ANY COLONIC TUBE W/FLUORO 04/13/2016 Markus Daft, MD  [facility]  . LAPAROSCOPIC GASTROSTOMY N/A 12/01/2015   Procedure: LAPAROSCOPIC PLACEMENT OF FEEDING JEJUNOSTOMY AND GASTROSTOMY TUBE;  Surgeon: Michael Boston, MD;  Location: [facility];  Service: General;  Laterality: N/A;  . LAPAROSCOPY N/A 12/03/2015   Procedure: LAPAROSCOPY DIAGNOSTIC, OMENTOPEXY, JEJUNOSTOMY, [place] OUT;  Surgeon: Michael Boston, MD;  Location: [facility];  Service: General;  Laterality: N/A;  . LAPAROTOMY N/A 02/06/2016   Procedure: EXPLORATORY LAPAROTOMY, LYSIS OF ADHESIONS, SEGMENTAL SMALL BOWEL RESECTION, GASTROJEJUNOSTOMY;  Surgeon: Autumn Messing III, MD;  Location: [facility];  Service: General;  Laterality: N/A;  . STENT TO HEART     2 1997 AND 1 IN 1996  . TONSILLECTOMY    . XI ROBOTIC VAGOTOMY AND ANTRECTOMY N/A 09/25/2015   Procedure: XI ROBOTIC ANTERIOR AND POSTERIOR VAGOTOMY, BILROTH I  ANASTOMOSIS [place], OMENTOPEXY  UPPER ENDOSCOPY;  Surgeon: Michael Boston, MD;  Location: [facility];  Service: General;  Laterality: N/A;    There were no vitals filed for this visit.      Subjective Assessment - 08/25/16 1449    Subjective "Why am I having this problem?" re: speech changes   Patient is accompained by: Family member   Currently in Pain? No/denies               ADULT SLP TREATMENT - 08/25/16 1453      General Information   Behavior/Cognition Alert;Cooperative;Pleasant mood   Patient Positioning Upright in chair     Treatment Provided   Treatment provided Cognitive-Linquistic;Dysphagia     Dysphagia Treatment   Temperature Spikes Noted No   Treatment Methods Skilled observation;Therapeutic exercise;Compensation strategy training;Patient/caregiver education   Patient observed directly with PO's Yes   Type of PO's observed Thin liquids   Feeding Able to feed self   Liquids provided via Cup   Pharyngeal Phase Signs & Symptoms Immediate cough;Delayed cough;Wet vocal quality   Type of cueing Verbal   Amount of cueing Minimal   Other treatment/comments  Pt with immediate cough x 2 following large cup sips of thin liquid. Patient noted with improper use of chin tuck (delayed and minimal tucking of chin). SLP provided consistent min-mod cues for reducing bolus size, increasing head tilt for reduced s/sx of aspiration and faded support to supervision.     Pain Assessment   Pain Assessment No/denies pain     Cognitive-Linquistic Treatment   Treatment focused on Dysarthria   Skilled Treatment Pt demo'd HEP for dysphagia/dysarthria. With inspiratory trainer, pt with consistent nasal emission which is alleviated with nose clip. With nose clip, pt with 95% success at 18cmH20 pressure for 25 repetitions with rare min A. For expiratory training, SLP readjusted setting to 30 cm H20 pressure and gradually built up to 45 cm with final two repetitions. Targeted use of dysarthria strategies at sentence level with 60% accuracy and consistent min A. Patient recalled 1/4 of strategies for memory.     Assessment / Recommendations / Plan   Plan Continue with current plan of care     Progression Toward Goals   Progression toward goals Progressing toward  goals          SLP Education - 08/25/16 1814    Education provided Yes   Education Details reduced velopharyngeal closure, increase head tilt during chin tuck, reduce bolus size, potential MD referrals to investigate cause of his speech/swallowing deficits   Person(s) Educated Patient   Methods Explanation;Demonstration;Verbal cues   Comprehension Verbalized understanding          SLP Short Term Goals - 08/25/16 1454      SLP SHORT TERM GOAL #1   Title pt will complete HEP for dysarthria with rare min A over two sessions   Baseline 6.18.18, 7.12.18   Time 1   Period Weeks   Status Achieved     SLP SHORT TERM GOAL #2   Title pt will use compensatory strategies for dysarthria 80% of the time in sentence level responses   Baseline phrase level 6.18.18   Time 1   Period Weeks   Status Not Met      SLP SHORT TERM GOAL #3   Title Pt will participate in assessment of cognitive communication within first 2 sessions.   Status Achieved     SLP SHORT TERM GOAL #4   Title Pt will tell SLP 4 strategies to improve attention, memory.    Time 1   Period Weeks   Status Not Met          SLP Long Term Goals - 08/25/16 1836      SLP LONG TERM GOAL #1   Title pt will demo HEP with modified independence over two sessions   Time 5   Period Weeks   Status On-going     SLP LONG TERM GOAL #2   Title Pt will use compensatory strategies for dysarthria to improve intelligiblity to 75% over 5 minutes mod complex conversation.   Time 5   Period Weeks   Status On-going     SLP LONG TERM GOAL #3   Title pt will tell SLP 3 s/s aspiration PNA with modified independence   Time 5   Period Weeks   Status On-going     SLP LONG TERM GOAL #4   Title Pt will report demo use of compensatory strategies for recall of daily activities, appointments or details from written materials in 2 therapy sessions.   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 Inspiratory and expiratory muscle strength training, swallowing compensatory strategies and speech intelligibility were targeted today. Recommend cont'd skilled ST  in order to improve intelligibility, maximize cognitive function, reduce risks for aspiration and to improve quality of life.   Speech Therapy Frequency 1x /week   Treatment/Interventions Aspiration precaution training;Cognitive reorganization;SLP instruction and feedback;Oral motor exercises;Diet toleration management by SLP;Patient/family education;Functional tasks;Pharyngeal strengthening exercises;Environmental controls;Compensatory strategies   Potential to Achieve Goals Good   Potential Considerations Severity of impairments   SLP Home Exercise Plan IMST, EMST, dysarthria strategies    Consulted and Agree with Plan of Care Patient;Family member/caregiver   Family Member Consulted wife      Patient will benefit from skilled therapeutic intervention in order to improve the following deficits and impairments:   Dysarthria and anarthria  Dysphagia, unspecified type  Cognitive communication deficit    Problem List Patient Active Problem List   Diagnosis Date Noted  . Delayed gastric emptying 04/22/2016  . Respiratory failure ([place])  04/16/2016  . SOB (shortness of breath)   . NSVT (nonsustained ventricular tachycardia) ([place])   . Elevated troponin 04/15/2016  . Acute on chronic diastolic heart failure ([place])   . Gastric dysmotility   . Gastrojejunostomy tube 02/06/2016 03/10/2016  . Hyponatremia   . Anxiety and depression   . Sleep disturbance   . Orthostasis   . Leukocytosis   . Hyperglycemia   . FTT (failure to thrive) in adult   . Anemia of chronic disease 01/31/2016  . Stage 2 skin ulcer of sacral region ([place]) 01/31/2016  . Hypomagnesemia 01/21/2016  . Hypokalemia 01/21/2016  . Pressure injury of skin 01/20/2016  . Pleural effusion   . Gastroesophageal reflux disease 01/19/2016  . Insomnia 01/19/2016  . Skin ulcer of abdominal wall, limited to breakdown of skin ([place]) 01/17/2016  . Dysphagia 01/17/2016  . Debility 01/17/2016  . 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 Vitamin B12 deficiency 01/02/2016  . BPH (benign prostatic hyperplasia)   . Posterior vitreous detachment   . Salzmann's nodular dystrophy of left eye   . Hyperopia of both eyes with astigmatism and presbyopia   . Central pterygium of right eye   . Pressure ulcer of buttock, right, unstageable ([place]) 12/30/2015  . Hypernatremia 12/28/2015  . Tobacco abuse 12/09/2015  . Gastritis 12/09/2015  . Respiratory failure, acute ([place])   . Protein-calorie malnutrition, severe 12/04/2015  . Encephalopathy acute 12/03/2015  . Anxiety state 09/28/2015  . Hyperlipidemia   . Acquired  stricture of pylorus s/p vagatomy & distal gastrectomy 09/25/2015 09/25/2015  . Coronary atherosclerosis of native coronary artery 04/24/2013  . Essential hypertension, benign 04/24/2013  . Other and unspecified hyperlipidemia 04/24/2013  . Central pterygium 10/30/2012  . 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 Cataract, nuclear sclerotic senile 10/30/2012   Deneise Lever, [place], CCC-SLP Speech-Language Pathologist  Aliene Altes 08/25/2016, 6:37 PM  [facility] [address] [place], [place], [address] Phone: [phone]   Fax:  [phone]   Name: YERIEL MINEO MRN: [id number] Date of Birth: 06-11-1940

## 2016-08-29 ENCOUNTER — Ambulatory Visit: Payer: Medicare Other | Admitting: Physical Therapy

## 2016-08-29 ENCOUNTER — Encounter: Payer: Self-pay | Admitting: Physical Therapy

## 2016-08-29 ENCOUNTER — Ambulatory Visit: Payer: Medicare Other | Admitting: Speech Pathology

## 2016-08-29 DIAGNOSIS — R2689 Other abnormalities of gait and mobility: Secondary | ICD-10-CM

## 2016-08-29 DIAGNOSIS — R2681 Unsteadiness on feet: Secondary | ICD-10-CM

## 2016-08-29 DIAGNOSIS — R41841 Cognitive communication deficit: Secondary | ICD-10-CM

## 2016-08-29 DIAGNOSIS — M6281 Muscle weakness (generalized): Secondary | ICD-10-CM

## 2016-08-29 DIAGNOSIS — R471 Dysarthria and anarthria: Secondary | ICD-10-CM

## 2016-08-29 DIAGNOSIS — R1310 Dysphagia, unspecified: Secondary | ICD-10-CM

## 2016-08-29 NOTE — Therapy (Signed)
[facility] [address] [place], [place], [address] Phone: [phone]   Fax:  [phone]  Physical Therapy Treatment  Patient Details  Name: [name] MRN: [id number] Date of Birth: Oct 22, 1940 Referring Provider: Lavone Orn, MD  Encounter Date: 08/29/2016      PT End of Session - 08/29/16 1608    Visit Number 7   Number of Visits 9   Date for PT Re-Evaluation 09/05/16   Authorization Type [employer] MCR-G Code on every 10th visit   PT Start Time 1446   PT Stop Time 1530   PT Time Calculation (min) 44 min   Equipment Utilized During Treatment Gait belt   Activity Tolerance Patient tolerated treatment well   Behavior During Therapy [facility] for tasks assessed/performed      Past Medical History:  Diagnosis Date  . 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 Aspiration pneumonia ([place]) 01/17/2016  . Bilateral dry eyes   . Bile peritonitis due to gastric tube dislodgement 12/03/2015  . BPH (benign prostatic hyperplasia)   . Central pterygium of right eye   . Chronic respiratory failure with hypoxia ([place])   . Coronary artery disease 1997, 2009   bare mental stent right coronart artery, occlusive followed by Dr. Pernell Dupre in [place]  . Degenerative disc disease    LOWER BACK  . Dysphonia   . ED (erectile dysfunction)   . 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 Gastric outlet obstruction 11/24/2015  . Gastroesophageal reflux disease   . Hyperlipidemia   . Hyperopia of both eyes with astigmatism and presbyopia   . Hypertension   . Ischemic right index finger at tip 12/09/2015  . Nuclear senile cataract    Dr. [facility] in [place]  . Peptic ulcer   . Pneumatosis of intestines 12/11/2015  . Pneumonia    HISTORY OF IN CHILDHOOD  . Posterior vitreous detachment 10/30/2012  . Pyloric stricture 11/24/2015  . Salzmann's  nodular dystrophy of left eye   . SBO (small bowel obstruction) ([place]) 01/20/2016  . Urinary hesitancy   . Wears glasses     Past Surgical History:  Procedure Laterality Date  . CARDIAC CATHETERIZATION    . CHOLECYSTECTOMY    . ESOPHAGOGASTRODUODENOSCOPY N/A 12/01/2015   Procedure: ESOPHAGOGASTRODUODENOSCOPY (EGD) BALLOON DILATION OF DUODENAL STRICTURE;  Surgeon: Michael Boston, MD;  Location: [facility];  Service: General;  Laterality: N/A;  . ESOPHAGOGASTRODUODENOSCOPY N/A 12/14/2015   Procedure: ESOPHAGOGASTRODUODENOSCOPY (EGD);  Surgeon: Clarene Essex, MD;  Location: [facility];  Service: Endoscopy;  Laterality: N/A;  Patient has tracheostomy  . ESOPHAGOGASTRODUODENOSCOPY (EGD) WITH PROPOFOL N/A 07/01/2015   Procedure: ESOPHAGOGASTRODUODENOSCOPY (EGD) WITH PROPOFOL;  Surgeon: Arta Silence, MD;  Location: [facility];  Service: Endoscopy;  Laterality: N/A;  . ESOPHAGOGASTRODUODENOSCOPY (EGD) WITH PROPOFOL N/A 11/23/2015   Procedure: ESOPHAGOGASTRODUODENOSCOPY (EGD) WITH PROPOFOL;  Surgeon: Arta Silence, MD;  Location: [facility];  Service: Endoscopy;  Laterality: N/A;  may need to intubate  . EYE SURGERY     growth on right eye removed   . IR GENERIC HISTORICAL  12/13/2015   IR REPLC DUODEN/JEJUNO TUBE PERCUT W/FLUORO 12/13/2015 Marybelle Killings, MD [facility]  . IR GENERIC HISTORICAL  02/03/2016   IR [place] GASTRO/COLONIC TUBE PERCUT W/FLUORO 02/03/2016 Greggory Keen, MD [facility]  . IR GENERIC HISTORICAL  02/04/2016   IR GASTR TUBE CONVERT GASTR-JEJ PER W/FL MOD SED 02/04/2016 Jacqulynn Cadet, MD [facility]  . IR GENERIC  HISTORICAL  02/25/2016   IR SINUS/FIST TUBE CHK-NON GI 02/25/2016 Aletta Edouard, MD [facility]  . IR GENERIC HISTORICAL  03/18/2016   IR CM INJ ANY COLONIC TUBE W/FLUORO 03/18/2016 Sandi Mariscal, MD [facility]  . IR GENERIC HISTORICAL  03/18/2016   IR GJ TUBE CHANGE 03/18/2016 Sandi Mariscal, MD [facility]  . IR GENERIC HISTORICAL  03/22/2016   IR CM INJ ANY COLONIC  TUBE W/FLUORO 03/22/2016 Sandi Mariscal, MD [facility]  . IR GENERIC HISTORICAL  04/13/2016   IR CM INJ ANY COLONIC TUBE W/FLUORO 04/13/2016 Markus Daft, MD [facility]  . LAPAROSCOPIC GASTROSTOMY N/A 12/01/2015   Procedure: LAPAROSCOPIC PLACEMENT OF FEEDING JEJUNOSTOMY AND GASTROSTOMY TUBE;  Surgeon: Michael Boston, MD;  Location: [facility];  Service: General;  Laterality: N/A;  . LAPAROSCOPY N/A 12/03/2015   Procedure: LAPAROSCOPY DIAGNOSTIC, OMENTOPEXY, JEJUNOSTOMY, [place] OUT;  Surgeon: Michael Boston, MD;  Location: [facility];  Service: General;  Laterality: N/A;  . LAPAROTOMY N/A 02/06/2016   Procedure: EXPLORATORY LAPAROTOMY, LYSIS OF ADHESIONS, SEGMENTAL SMALL BOWEL RESECTION, GASTROJEJUNOSTOMY;  Surgeon: Autumn Messing III, MD;  Location: [facility];  Service: General;  Laterality: N/A;  . STENT TO HEART     2 1997 AND 1 IN 1996  . TONSILLECTOMY    . XI ROBOTIC VAGOTOMY AND ANTRECTOMY N/A 09/25/2015   Procedure: XI ROBOTIC ANTERIOR AND POSTERIOR VAGOTOMY, BILROTH I  ANASTOMOSIS [place], OMENTOPEXY  UPPER ENDOSCOPY;  Surgeon: Michael Boston, MD;  Location: [facility];  Service: General;  Laterality: N/A;    There were no vitals filed for this visit.         [facility] Adult PT Treatment/Exercise - 08/29/16 0001      Transfers   Transfers Sit to Stand;Stand to Sit   Sit to Stand 6: Modified independent (Device/Increase time)   Stand to Sit 6: Modified independent (Device/Increase time)     Ambulation/Gait   Ambulation/Gait Yes   Ambulation/Gait Assistance 5: Supervision   Ambulation Distance (Feet) 500 Feet   Assistive device Straight cane   Gait Pattern Step-through pattern;Decreased stride length;Trunk flexed;Right flexed knee in stance;Left flexed knee in stance   Ambulation Surface Level;Unlevel;Indoor;Paved;Outdoor;Gravel;Grass   Ramp 5: Supervision   Curb 5: Supervision   Curb Details (indicate cue type and reason) VC's for technique ascending curb, for sequencing of cane in R hand, LLE, then  RLE; without VC, Pt. ascended curb with cane and RLE resulting in small LOB towards right; required cues for 2/3 trials; discussed importance and rationale for sequencing for safety and balance.   Gait Comments Traveled over 250 feet through tall grass with SPC with supervision and no LOB, noted decreased pace, and step length through grass which improved when returned to paved outdoor surface, intermittent VC to increase step length.     High Level Balance   High Level Balance Activities Sudden stops;Turns;Head turns   High Level Balance Comments Block practice: walk, stop and turn on command, pt. safely performed all trials with control and no LOB using SPC, supervision. Walk with head turns R<>L, supervision to min assist for LOB to R x2, VC for posture.     Neuro Re-ed    Neuro Re-ed Details  Large Rockerboard      Large Rockerboard: board placed for anterior/posterior rock: --EO, static balance x20 seconds x3, no UE support; head turns R<>L x10, no UE support, rock board anterior/posterior x10, unilateral support to no UE support as balance improved;  --board placed for lateral rock: static, EO x20  seconds x3, lateral rock x10 side<>side; unilateral UE support to no UE support as balance improved for all trials with board placed laterally; All trials on rockerboard required VC for weight shifting and posture and min assist to min guard as balance improved.  Level surface: SLS with 3 second hold, alternating LE, unilateral UE support, VC for weight shifting, stand tall on top of supporting leg, posture, min assist to min guard as independence with balance improved throughout duration of trials.          PT Education - 08/29/16 1605    Education provided Yes   Education Details Sequencing for ascending curb with SPC, reinforced and discussed importance/safety of ascending curb with left LE and cane in R hand and danger of ascending with cane in R hand and RLE=LOB; also reinforced SLP  rec's for swallowing with thin liquids due to cough noted after each sip of water.   Person(s) Educated Patient;Spouse   Methods Explanation;Demonstration   Comprehension Verbalized understanding;Returned demonstration;Need further instruction          PT Short Term Goals - 08/29/16 1621      PT SHORT TERM GOAL #1   Title Pt/family/caregiver will be independent with initial HEP in order to indicate improved functional mobility and decreased fall risk.  (Target Date: 08/06/16)   Baseline met 08/25/16   Status Achieved     PT SHORT TERM GOAL #2   Title Pt will improve BERG balance score to 41/56 in order to indicate decreased fall risk.     Baseline 47/56 on 08/19/16   Status Achieved     PT SHORT TERM GOAL #3   Title Pt will perform 5TSS in <17 secs with single UE support in order to indicate improved fuctional strength and decreased fall risk.     Baseline 17.25 secs with single UE support 08/19/16   Status Partially Met     PT SHORT TERM GOAL #4   Title Pt will improve gait speed to 2.21 ft/sec w/ LRAD in order to indicate improved efficiency of gait and decreased fall risk.     Baseline 3.31 ft/sec on 08/19/16   Status Achieved     PT SHORT TERM GOAL #5   Title Will assess 6MWT and improve distance by 75' from baseline in order to indicate improved functional endurance.     Baseline 662' at baseline to 830' on 08/19/16   Status Achieved           PT Long Term Goals - 08/19/16 1137      PT LONG TERM GOAL #1   Title Pt/wife/caregiver will be independent with final HEP in order to indicate continued fitness and decreased fall risk.  (Target Date: 09/05/16)   Status New     PT LONG TERM GOAL #2   Title Pt will improve BERG balance score to >/=51//56 in order to indicate decreased fall risk.     Baseline 47/56 on 08/19/16   Status Revised     PT LONG TERM GOAL #3   Title Pt will perform 5TSS in </=15 secs without UE support in order to indicate decreased fall risk and improved  functional strength.     Baseline --   Status New     PT LONG TERM GOAL #4   Title Pt will improve gait speed to >/=2.62 w/ LRAD in order to indicate improved efficiency of gait and decreased fall risk.     Baseline 3.31 ft/sec on 08/19/16   Status Achieved  PT LONG TERM GOAL #5   Title Pt will ambulate up to 500' over unlevel paved outdoor surfaces w/ LRAD at S level in order to indicate return to community negotiation.     Status New     PT LONG TERM GOAL #6   Title Pt will improve 6MWT by 300' from baseline in order to indicate improved functional endurance.     Baseline 168' improvement at STG   Status Revised            Plan - 08/29/16 1619    Clinical Impression Statement Today's skilled session focused on gait training over uneven surfaces, dynamic balance with gait, as well as static/dynamic balance in standing. Patient demonstrated increased endurance using SPC outdoors over a variety of uneven surfaces, including a 250 foot stretch plush grassy surface, with no LOB. Pt tolerated increased distance well, recovering after a 500 foot walk outdoors with a short rest break. He demonstrated improved balance as tasks progressed. Initial min assist during first few trials of balance tasks facilitated increased independence with weight shifting requiring min guard as tasks progressed. Patient is progressing well towards LTG's and will benefit from continued PT to progress towards unmet goals.   PT Frequency 1x / week   PT Duration 8 weeks   PT Treatment/Interventions ADLs/Self Care Home Management;Electrical Stimulation;DME Instruction;Gait training;Stair training;Functional mobility training;Therapeutic activities;Therapeutic exercise;Balance training;Neuromuscular re-education;Patient/family education;Orthotic Fit/Training;Passive range of motion;Vestibular   PT Next Visit Plan Continue to focus on high level balance, check sequencing for curbs,    Consulted and Agree with Plan of  Care Patient;Family member/caregiver   Family Member Consulted wife       Patient will benefit from skilled therapeutic intervention in order to improve the following deficits and impairments:  Abnormal gait, Decreased activity tolerance, Decreased balance, Decreased cognition, Decreased endurance, Decreased knowledge of use of DME, Decreased mobility, Decreased range of motion, Decreased strength, Impaired perceived functional ability, Impaired flexibility, Postural dysfunction  Visit Diagnosis: Unsteadiness on feet  Muscle weakness (generalized)  Other abnormalities of gait and mobility     Problem List Patient Active Problem List   Diagnosis Date Noted  . Delayed gastric emptying 04/22/2016  . Respiratory failure ([place]) 04/16/2016  . SOB (shortness of breath)   . NSVT (nonsustained ventricular tachycardia) ([place])   . Elevated troponin 04/15/2016  . Acute on chronic diastolic heart failure ([place])   . Gastric dysmotility   . Gastrojejunostomy tube 02/06/2016 03/10/2016  . Hyponatremia   . Anxiety and depression   . Sleep disturbance   . Orthostasis   . Leukocytosis   . Hyperglycemia   . FTT (failure to thrive) in adult   . Anemia of chronic disease 01/31/2016  . Stage 2 skin ulcer of sacral region ([place]) 01/31/2016  . Hypomagnesemia 01/21/2016  . Hypokalemia 01/21/2016  . Pressure injury of skin 01/20/2016  . Pleural effusion   . Gastroesophageal reflux disease 01/19/2016  . Insomnia 01/19/2016  . Skin ulcer of abdominal wall, limited to breakdown of skin ([place]) 01/17/2016  . Dysphagia 01/17/2016  . Debility 01/17/2016  . 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 Vitamin B12 deficiency 01/02/2016  . BPH (benign prostatic hyperplasia)   . Posterior vitreous detachment   . Salzmann's nodular dystrophy of left eye   . Hyperopia of both eyes with astigmatism and presbyopia   . Central pterygium of right eye   . Pressure ulcer of buttock, right, unstageable ([place])  12/30/2015  . Hypernatremia 12/28/2015  . Tobacco abuse 12/09/2015  .  Gastritis 12/09/2015  . Respiratory failure, acute ([place])   . Protein-calorie malnutrition, severe 12/04/2015  . Encephalopathy acute 12/03/2015  . Anxiety state 09/28/2015  . Hyperlipidemia   . 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 Coronary atherosclerosis of native coronary artery 04/24/2013  . Essential hypertension, benign 04/24/2013  . Other and unspecified hyperlipidemia 04/24/2013  . Central pterygium 10/30/2012  . 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 Cataract, nuclear sclerotic senile 10/30/2012    Vaughan Sine 08/29/2016, 4:30 PM  [facility] [address] [place], [place], [address] Phone: [phone]   Fax:  [phone]  Name: [name] MRN: [id number] Date of Birth: 04-Apr-1940

## 2016-08-30 NOTE — Therapy (Signed)
[facility] [address] [place], [place], [address] Phone: [phone]   Fax:  [phone]  Speech Language Pathology Treatment  Patient Details  Name: [name] MRN: [id number] Date of Birth: Jul 10, 1940 Referring Provider: Lavone Orn, MD  Encounter Date: 08/29/2016      End of Session - 08/29/16 1357    Visit Number 7   Number of Visits 9   Date for SLP Re-Evaluation 09/02/16   SLP Start Time 6384   SLP Stop Time  1617   SLP Time Calculation (min) 44 min   Activity Tolerance Patient tolerated treatment well      Past Medical History:  Diagnosis Date  . 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 Aspiration pneumonia ([place]) 01/17/2016  . Bilateral dry eyes   . Bile peritonitis due to gastric tube dislodgement 12/03/2015  . BPH (benign prostatic hyperplasia)   . Central pterygium of right eye   . Chronic respiratory failure with hypoxia ([place])   . Coronary artery disease 1997, 2009   bare mental stent right coronart artery, occlusive followed by Dr. Pernell Dupre in [place]  . Degenerative disc disease    LOWER BACK  . Dysphonia   . ED (erectile dysfunction)   . 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 Gastric outlet obstruction 11/24/2015  . Gastroesophageal reflux disease   . Hyperlipidemia   . Hyperopia of both eyes with astigmatism and presbyopia   . Hypertension   . Ischemic right index finger at tip 12/09/2015  . Nuclear senile cataract    Dr. [facility] in [place]  . Peptic ulcer   . Pneumatosis of intestines 12/11/2015  . Pneumonia    HISTORY OF IN CHILDHOOD  . Posterior vitreous detachment 10/30/2012  . Pyloric stricture 11/24/2015  . Salzmann's nodular dystrophy of left eye   . SBO (small bowel obstruction) ([place]) 01/20/2016  . Urinary hesitancy   . Wears glasses     Past Surgical History:   Procedure Laterality Date  . CARDIAC CATHETERIZATION    . CHOLECYSTECTOMY    . ESOPHAGOGASTRODUODENOSCOPY N/A 12/01/2015   Procedure: ESOPHAGOGASTRODUODENOSCOPY (EGD) BALLOON DILATION OF DUODENAL STRICTURE;  Surgeon: Michael Boston, MD;  Location: [facility];  Service: General;  Laterality: N/A;  . ESOPHAGOGASTRODUODENOSCOPY N/A 12/14/2015   Procedure: ESOPHAGOGASTRODUODENOSCOPY (EGD);  Surgeon: Clarene Essex, MD;  Location: [facility];  Service: Endoscopy;  Laterality: N/A;  Patient has tracheostomy  . ESOPHAGOGASTRODUODENOSCOPY (EGD) WITH PROPOFOL N/A 07/01/2015   Procedure: ESOPHAGOGASTRODUODENOSCOPY (EGD) WITH PROPOFOL;  Surgeon: Arta Silence, MD;  Location: [facility];  Service: Endoscopy;  Laterality: N/A;  . ESOPHAGOGASTRODUODENOSCOPY (EGD) WITH PROPOFOL N/A 11/23/2015   Procedure: ESOPHAGOGASTRODUODENOSCOPY (EGD) WITH PROPOFOL;  Surgeon: Arta Silence, MD;  Location: [facility];  Service: Endoscopy;  Laterality: N/A;  may need to intubate  . EYE SURGERY     growth on right eye removed   . IR GENERIC HISTORICAL  12/13/2015   IR REPLC DUODEN/JEJUNO TUBE PERCUT W/FLUORO 12/13/2015 Marybelle Killings, MD [facility]  . IR GENERIC HISTORICAL  02/03/2016   IR [place] GASTRO/COLONIC TUBE PERCUT W/FLUORO 02/03/2016 Greggory Keen, MD [facility]  . IR GENERIC HISTORICAL  02/04/2016   IR GASTR TUBE CONVERT GASTR-JEJ PER W/FL MOD SED 02/04/2016 Jacqulynn Cadet, MD [facility]  . IR GENERIC HISTORICAL  02/25/2016   IR SINUS/FIST TUBE CHK-NON GI 02/25/2016 Aletta Edouard, MD [facility]  . IR GENERIC HISTORICAL  03/18/2016   IR CM  INJ ANY COLONIC TUBE W/FLUORO 03/18/2016 Sandi Mariscal, MD [facility]  . IR GENERIC HISTORICAL  03/18/2016   IR GJ TUBE CHANGE 03/18/2016 Sandi Mariscal, MD [facility]  . IR GENERIC HISTORICAL  03/22/2016   IR CM INJ ANY COLONIC TUBE W/FLUORO 03/22/2016 Sandi Mariscal, MD [facility]  . IR GENERIC HISTORICAL  04/13/2016   IR CM INJ ANY COLONIC TUBE W/FLUORO 04/13/2016 Markus Daft, MD  [facility]  . LAPAROSCOPIC GASTROSTOMY N/A 12/01/2015   Procedure: LAPAROSCOPIC PLACEMENT OF FEEDING JEJUNOSTOMY AND GASTROSTOMY TUBE;  Surgeon: Michael Boston, MD;  Location: [facility];  Service: General;  Laterality: N/A;  . LAPAROSCOPY N/A 12/03/2015   Procedure: LAPAROSCOPY DIAGNOSTIC, OMENTOPEXY, JEJUNOSTOMY, [place] OUT;  Surgeon: Michael Boston, MD;  Location: [facility];  Service: General;  Laterality: N/A;  . LAPAROTOMY N/A 02/06/2016   Procedure: EXPLORATORY LAPAROTOMY, LYSIS OF ADHESIONS, SEGMENTAL SMALL BOWEL RESECTION, GASTROJEJUNOSTOMY;  Surgeon: Autumn Messing III, MD;  Location: [facility];  Service: General;  Laterality: N/A;  . STENT TO HEART     2 1997 AND 1 IN 1996  . TONSILLECTOMY    . XI ROBOTIC VAGOTOMY AND ANTRECTOMY N/A 09/25/2015   Procedure: XI ROBOTIC ANTERIOR AND POSTERIOR VAGOTOMY, BILROTH I  ANASTOMOSIS [place], OMENTOPEXY  UPPER ENDOSCOPY;  Surgeon: Michael Boston, MD;  Location: [facility];  Service: General;  Laterality: N/A;    There were no vitals filed for this visit.      Subjective Assessment - 08/29/16 1534    Subjective Pt reports coughing with water during PT session.   Patient is accompained by: Family member   Currently in Pain? No/denies               ADULT SLP TREATMENT - 08/29/16 1533      General Information   Behavior/Cognition Alert;Cooperative;Pleasant mood   Patient Positioning Upright in chair   Oral care provided N/A     Treatment Provided   Treatment provided Dysphagia;Cognitive-Linquistic     Dysphagia Treatment   Temperature Spikes Noted No   Respiratory Status Room air   Oral Cavity - Dentition Adequate natural dentition   Treatment Methods Skilled observation;Therapeutic exercise;Compensation strategy training;Patient/caregiver education   Patient observed directly with PO's Yes   Type of PO's observed Thin liquids   Feeding Able to feed self   Liquids provided via Cup;Straw   Amount of cueing Modified independent   Other  treatment/comments Pt demo'd HEP for dysphagia/dysarthria, as well as swallowing precautions with modified independence. SLP noted pt lifted his head during chin tuck prior to completion of swallow; instructed pt to complete the swallow prior to lifting his chin. For remaining PO trials, pt demonstrated correct use of compensatory techniques with no need for additional cues, no overt signs of aspiration. Pt's wife recorded video of pt completing chin tuck accurately.      Pain Assessment   Pain Assessment No/denies pain     Cognitive-Linquistic Treatment   Treatment focused on Dysarthria   Skilled Treatment With min A pt named 3/4 memory strategies and recalled intelligibility strategies. At sentence level, pt implements intelligibility strategies with occasional min A, 75% accuracy.      Assessment / Recommendations / Plan   Plan Continue with current plan of care     Dysphagia Recommendations   Diet recommendations Regular;Thin liquid   Liquids provided via Cup;Straw   Supervision Patient able to self feed   Compensations Clear throat intermittently;Effortful swallow  Chin tuck   Postural Changes and/or Swallow Maneuvers  Chin tuck     Progression Toward Goals   Progression toward goals Progressing toward goals          SLP Education - 08/29/16 1358    Education provided Yes   Education Details hold chin tuck during swallow with sufficient time to complete swallow prior to lifting chin   Person(s) Educated Patient;Spouse   Methods Explanation;Demonstration;Verbal cues   Comprehension Verbalized understanding;Returned demonstration          SLP Short Term Goals - 08/29/16 1545      SLP SHORT TERM GOAL #1   Title pt will complete HEP for dysarthria with rare min A over two sessions   Status Achieved     SLP SHORT TERM GOAL #2   Title pt will use compensatory strategies for dysarthria 80% of the time in sentence level responses   Status Not Met     SLP SHORT TERM GOAL #3    Title Pt will participate in assessment of cognitive communication within first 2 sessions.   Status Achieved     SLP SHORT TERM GOAL #4   Title Pt will tell SLP 4 strategies to improve attention, memory.    Status Not Met          SLP Long Term Goals - 08/29/16 1545      SLP LONG TERM GOAL #1   Title pt will demo HEP with modified independence over two sessions   Baseline 7.16.18   Time 3   Period Weeks   Status On-going     SLP LONG TERM GOAL #2   Title Pt will use compensatory strategies for dysarthria to improve intelligiblity to 75% over 5 minutes mod complex conversation.   Time 3   Period Weeks   Status On-going     SLP LONG TERM GOAL #3   Title pt will tell SLP 3 s/s aspiration PNA with modified independence   Time 3   Period Weeks   Status On-going     SLP LONG TERM GOAL #4   Title Pt will report demo use of compensatory strategies for recall of daily activities, appointments or details from written materials in 2 therapy sessions.   Time 3   Period Weeks   Status On-going          Plan - 08/29/16 1545    Clinical Impression Statement Pt continues to present with ataxic dysarthria, impaired memory, and dysphagia. Pt with overt signs of aspiration when drinking water during PT session prior to SLP treatment. Demos HEP and compensatory techniques for swallowing today during session with modified independence and no overt signs of aspiration. Continues to require cues at sentence level for use of dysarthria strategies to improve intelligibility. Recommend cont'd skilled ST in order to improve intelligibility, maximize cognitive function, reduce risks for aspiration and to improve quality of life. Will discuss renewal for 4 additional weeks next session; pt may benefit from increase in frequency to improve carryover of trained strategies.   Speech Therapy Frequency --  consider increasing frequency to 2x per week   Treatment/Interventions Aspiration precaution  training;Cognitive reorganization;SLP instruction and feedback;Oral motor exercises;Diet toleration management by SLP;Patient/family education;Functional tasks;Pharyngeal strengthening exercises;Environmental controls;Compensatory strategies   Potential to Achieve Goals Good   Potential Considerations Severity of impairments   SLP Home Exercise Plan IMST, EMST, dysarthria strategies   Consulted and Agree with Plan of Care Patient;Family member/caregiver   Family Member Consulted wife      Patient will benefit from skilled therapeutic intervention  in order to improve the following deficits and impairments:   Dysarthria and anarthria  Dysphagia, unspecified type  Cognitive communication deficit    Problem List Patient Active Problem List   Diagnosis Date Noted  . Delayed gastric emptying 04/22/2016  . Respiratory failure ([place]) 04/16/2016  . SOB (shortness of breath)   . NSVT (nonsustained ventricular tachycardia) ([place])   . Elevated troponin 04/15/2016  . Acute on chronic diastolic heart failure ([place])   . Gastric dysmotility   . Gastrojejunostomy tube 02/06/2016 03/10/2016  . Hyponatremia   . Anxiety and depression   . Sleep disturbance   . Orthostasis   . Leukocytosis   . Hyperglycemia   . FTT (failure to thrive) in adult   . Anemia of chronic disease 01/31/2016  . Stage 2 skin ulcer of sacral region ([place]) 01/31/2016  . Hypomagnesemia 01/21/2016  . Hypokalemia 01/21/2016  . Pressure injury of skin 01/20/2016  . Pleural effusion   . Gastroesophageal reflux disease 01/19/2016  . Insomnia 01/19/2016  . Skin ulcer of abdominal wall, limited to breakdown of skin ([place]) 01/17/2016  . Dysphagia 01/17/2016  . Debility 01/17/2016  . 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 Vitamin B12 deficiency 01/02/2016  . BPH (benign prostatic hyperplasia)   . Posterior vitreous detachment   . Salzmann's nodular dystrophy of left eye   . Hyperopia of both eyes with astigmatism and  presbyopia   . Central pterygium of right eye   . Pressure ulcer of buttock, right, unstageable ([place]) 12/30/2015  . Hypernatremia 12/28/2015  . Tobacco abuse 12/09/2015  . Gastritis 12/09/2015  . Respiratory failure, acute ([place])   . Protein-calorie malnutrition, severe 12/04/2015  . Encephalopathy acute 12/03/2015  . Anxiety state 09/28/2015  . Hyperlipidemia   . 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 Coronary atherosclerosis of native coronary artery 04/24/2013  . Essential hypertension, benign 04/24/2013  . Other and unspecified hyperlipidemia 04/24/2013  . Central pterygium 10/30/2012  . 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 Cataract, nuclear sclerotic senile 10/30/2012   Deneise Lever, [place], CCC-SLP Speech-Language Pathologist  Aliene Altes 08/30/2016, Howie Ill PM  [facility] [address] [place], [place], [address] Phone: [phone]   Fax:  [phone]   Name: [name] MRN: [id number] Date of Birth: September 26, 1940

## 2016-09-05 ENCOUNTER — Encounter: Payer: Self-pay | Admitting: Rehabilitation

## 2016-09-05 ENCOUNTER — Ambulatory Visit: Payer: Medicare Other | Admitting: Rehabilitation

## 2016-09-05 ENCOUNTER — Ambulatory Visit: Payer: Medicare Other | Admitting: Speech Pathology

## 2016-09-05 DIAGNOSIS — R41841 Cognitive communication deficit: Secondary | ICD-10-CM

## 2016-09-05 DIAGNOSIS — R2689 Other abnormalities of gait and mobility: Secondary | ICD-10-CM

## 2016-09-05 DIAGNOSIS — M6281 Muscle weakness (generalized): Secondary | ICD-10-CM

## 2016-09-05 DIAGNOSIS — R2681 Unsteadiness on feet: Secondary | ICD-10-CM | POA: Diagnosis not present

## 2016-09-05 DIAGNOSIS — R1310 Dysphagia, unspecified: Secondary | ICD-10-CM

## 2016-09-05 DIAGNOSIS — R471 Dysarthria and anarthria: Secondary | ICD-10-CM

## 2016-09-05 NOTE — Therapy (Signed)
[facility] [address] [place], [place], [address] Phone: [phone]   Fax:  [phone]  Physical Therapy Treatment and D/C Summary  Patient Details  Name: [name] MRN: [id number] Date of Birth: 1940-10-24 Referring Provider: Lavone Orn, MD  Encounter Date: 09/05/2016      PT End of Session - 09/05/16 1405    Visit Number 8   Number of Visits 9   Date for PT Re-Evaluation 09/05/16   Authorization Type [employer] MCR-G Code on every 10th visit   PT Start Time 1402   PT Stop Time 1445   PT Time Calculation (min) 43 min   Equipment Utilized During Treatment Gait belt   Activity Tolerance Patient tolerated treatment well   Behavior During Therapy [facility] for tasks assessed/performed      Past Medical History:  Diagnosis Date  . 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 Aspiration pneumonia ([place]) 01/17/2016  . Bilateral dry eyes   . Bile peritonitis due to gastric tube dislodgement 12/03/2015  . BPH (benign prostatic hyperplasia)   . Central pterygium of right eye   . Chronic respiratory failure with hypoxia ([place])   . Coronary artery disease 1997, 2009   bare mental stent right coronart artery, occlusive followed by Dr. Pernell Dupre in [place]  . Degenerative disc disease    LOWER BACK  . Dysphonia   . ED (erectile dysfunction)   . 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 Gastric outlet obstruction 11/24/2015  . Gastroesophageal reflux disease   . Hyperlipidemia   . Hyperopia of both eyes with astigmatism and presbyopia   . Hypertension   . Ischemic right index finger at tip 12/09/2015  . Nuclear senile cataract    Dr. [facility] in [place]  . Peptic ulcer   . Pneumatosis of intestines 12/11/2015  . Pneumonia    HISTORY OF IN CHILDHOOD  . Posterior vitreous detachment 10/30/2012  . Pyloric stricture  11/24/2015  . Salzmann's nodular dystrophy of left eye   . SBO (small bowel obstruction) ([place]) 01/20/2016  . Urinary hesitancy   . Wears glasses     Past Surgical History:  Procedure Laterality Date  . CARDIAC CATHETERIZATION    . CHOLECYSTECTOMY    . ESOPHAGOGASTRODUODENOSCOPY N/A 12/01/2015   Procedure: ESOPHAGOGASTRODUODENOSCOPY (EGD) BALLOON DILATION OF DUODENAL STRICTURE;  Surgeon: Michael Boston, MD;  Location: [facility];  Service: General;  Laterality: N/A;  . ESOPHAGOGASTRODUODENOSCOPY N/A 12/14/2015   Procedure: ESOPHAGOGASTRODUODENOSCOPY (EGD);  Surgeon: Clarene Essex, MD;  Location: [facility];  Service: Endoscopy;  Laterality: N/A;  Patient has tracheostomy  . ESOPHAGOGASTRODUODENOSCOPY (EGD) WITH PROPOFOL N/A 07/01/2015   Procedure: ESOPHAGOGASTRODUODENOSCOPY (EGD) WITH PROPOFOL;  Surgeon: Arta Silence, MD;  Location: [facility];  Service: Endoscopy;  Laterality: N/A;  . ESOPHAGOGASTRODUODENOSCOPY (EGD) WITH PROPOFOL N/A 11/23/2015   Procedure: ESOPHAGOGASTRODUODENOSCOPY (EGD) WITH PROPOFOL;  Surgeon: Arta Silence, MD;  Location[facility];  Service: Endoscopy;  Laterality: N/A;  may need to intubate  . EYE SURGERY     growth on right eye removed   . IR GENERIC HISTORICAL  12/13/2015   IR REPLC DUODEN/JEJUNO TUBE PERCUT W/FLUORO 12/13/2015 Marybelle Killings, MD [facility]  . IR GENERIC HISTORICAL  02/03/2016   IR [place] GASTRO/COLONIC TUBE PERCUT W/FLUORO 02/03/2016 Greggory Keen, MD [facility]  . IR GENERIC HISTORICAL  02/04/2016   IR GASTR TUBE CONVERT GASTR-JEJ PER W/FL MOD SED 02/04/2016 Jacqulynn Cadet, MD [facility]  .  IR GENERIC HISTORICAL  02/25/2016   IR SINUS/FIST TUBE CHK-NON GI 02/25/2016 Aletta Edouard, MD [facility]  . IR GENERIC HISTORICAL  03/18/2016   IR CM INJ ANY COLONIC TUBE W/FLUORO 03/18/2016 Sandi Mariscal, MD [facility]  . IR GENERIC HISTORICAL  03/18/2016   IR GJ TUBE CHANGE 03/18/2016 Sandi Mariscal, MD [facility]  . IR GENERIC HISTORICAL  03/22/2016    IR CM INJ ANY COLONIC TUBE W/FLUORO 03/22/2016 Sandi Mariscal, MD [facility]  . IR GENERIC HISTORICAL  04/13/2016   IR CM INJ ANY COLONIC TUBE W/FLUORO 04/13/2016 Markus Daft, MD [facility]  . LAPAROSCOPIC GASTROSTOMY N/A 12/01/2015   Procedure: LAPAROSCOPIC PLACEMENT OF FEEDING JEJUNOSTOMY AND GASTROSTOMY TUBE;  Surgeon: Michael Boston, MD;  Location: [facility];  Service: General;  Laterality: N/A;  . LAPAROSCOPY N/A 12/03/2015   Procedure: LAPAROSCOPY DIAGNOSTIC, OMENTOPEXY, JEJUNOSTOMY, [place] OUT;  Surgeon: Michael Boston, MD;  Location[facility];  Service: General;  Laterality: N/A;  . LAPAROTOMY N/A 02/06/2016   Procedure: EXPLORATORY LAPAROTOMY, LYSIS OF ADHESIONS, SEGMENTAL SMALL BOWEL RESECTION, GASTROJEJUNOSTOMY;  Surgeon: Autumn Messing III, MD;  Location: [facility];  Service: General;  Laterality: N/A;  . STENT TO HEART     2 1997 AND 1 IN 1996  . TONSILLECTOMY    . XI ROBOTIC VAGOTOMY AND ANTRECTOMY N/A 09/25/2015   Procedure: XI ROBOTIC ANTERIOR AND POSTERIOR VAGOTOMY, BILROTH I  ANASTOMOSIS [place], OMENTOPEXY  UPPER ENDOSCOPY;  Surgeon: Michael Boston, MD;  Location: [facility];  Service: General;  Laterality: N/A;    There were no vitals filed for this visit.      Subjective Assessment - 09/05/16 1404    Subjective Pt reports no complaints, no falls.     Patient is accompained by: Family member   Pertinent History History of several GI surgeries, dementia, swallowing/speech difficulties   Limitations House hold activities;Walking;Standing   Patient Stated Goals "I want to be able to walk."    Currently in Pain? No/denies            [facility] PT Assessment - 09/05/16 1431      6 Minute Walk- Baseline   6 Minute Walk- Baseline yes   BP (mmHg) 119/59   HR (bpm) 74   02 Sat (%RA) 96 %   Modified Borg Scale for Dyspnea 0- Nothing at all   Perceived Rate of Exertion (Borg) 6-     6 Minute walk- Post Test   BP (mmHg) (!)  87/57  112/67 following 3 mins of rest   HR (bpm) 125   02  Sat (%RA) 96 %   Modified Borg Scale for Dyspnea 8-   Perceived Rate of Exertion (Borg) 13- Somewhat hard     6 minute walk test results    Aerobic Endurance Distance Walked 1068   Endurance additional comments half way without SPC, half way with [facility]                     [facility] Adult PT Treatment/Exercise - 09/05/16 1431      Transfers   Transfers Sit to Stand;Stand to Sit   Sit to Stand 7: Independent   Five time sit to stand comments  11.62 secs without UE support   Stand to Sit 7: Independent     Standardized Balance Assessment   Standardized Balance Assessment Berg Balance Test     Berg Balance Test   Sit to Stand Able to stand without using hands and stabilize independently  Standing Unsupported Able to stand safely 2 minutes   Sitting with Back Unsupported but Feet Supported on Floor or Stool Able to sit safely and securely 2 minutes   Stand to Sit Sits safely with minimal use of hands   Transfers Able to transfer safely, minor use of hands   Standing Unsupported with Eyes Closed Able to stand 10 seconds safely   Standing Ubsupported with Feet Together Able to place feet together independently and stand 1 minute safely   From Standing, Reach Forward with Outstretched Arm Can reach confidently >25 cm (10")   From Standing Position, Pick up Object from Floor Able to pick up shoe safely and easily   From Standing Position, Turn to Look Behind Over each Shoulder Looks behind from both sides and weight shifts well   Turn 360 Degrees Able to turn 360 degrees safely one side only in 4 seconds or less   Standing Unsupported, Alternately Place Feet on Step/Stool Able to stand independently and safely and complete 8 steps in 20 seconds   Standing Unsupported, One Foot in Front Able to plae foot ahead of the other independently and hold 30 seconds   Standing on One Leg Able to lift leg independently and hold 5-10 seconds   Total Score 53     Neuro Re-ed    Neuro Re-ed  Details  Went over marching and tandem walking along counter top as part of HEP.  Pt doing much better with these.  Only verbally went over corner tasks.                    PT Short Term Goals - 08/29/16 1621      PT SHORT TERM GOAL #1   Title Pt/family/caregiver will be independent with initial HEP in order to indicate improved functional mobility and decreased fall risk.  (Target Date: 08/06/16)   Baseline met 08/25/16   Status Achieved     PT SHORT TERM GOAL #2   Title Pt will improve BERG balance score to 41/56 in order to indicate decreased fall risk.     Baseline 47/56 on 08/19/16   Status Achieved     PT SHORT TERM GOAL #3   Title Pt will perform 5TSS in <17 secs with single UE support in order to indicate improved fuctional strength and decreased fall risk.     Baseline 17.25 secs with single UE support 08/19/16   Status Partially Met     PT SHORT TERM GOAL #4   Title Pt will improve gait speed to 2.21 ft/sec w/ LRAD in order to indicate improved efficiency of gait and decreased fall risk.     Baseline 3.31 ft/sec on 08/19/16   Status Achieved     PT SHORT TERM GOAL #5   Title Will assess 6MWT and improve distance by 75' from baseline in order to indicate improved functional endurance.     Baseline 662' at baseline to 830' on 08/19/16   Status Achieved           PT Long Term Goals - 09/05/16 1405      PT LONG TERM GOAL #1   Title Pt/wife/caregiver will be independent with final HEP in order to indicate continued fitness and decreased fall risk.  (Target Date: 09/05/16)   Baseline met with cues from wife 09/05/16   Status Achieved     PT LONG TERM GOAL #2   Title Pt will improve BERG balance score to >/=51//56 in order to indicate  decreased fall risk.     Baseline 53/56 on Sep 23, 2016   Status Achieved     PT LONG TERM GOAL #3   Title Pt will perform 5TSS in </=15 secs without UE support in order to indicate decreased fall risk and improved functional strength.      Baseline 11.62 secs    Status Achieved     PT LONG TERM GOAL #4   Title Pt will improve gait speed to >/=2.62 w/ LRAD in order to indicate improved efficiency of gait and decreased fall risk.     Baseline 3.31 ft/sec on 08/19/16   Status Achieved     PT LONG TERM GOAL #5   Title Pt will ambulate up to 500' over unlevel paved outdoor surfaces w/ LRAD at S level in order to indicate return to community negotiation.     Baseline met 09-23-16   Status Achieved     PT LONG TERM GOAL #6   Title Pt will improve 6MWT by 300' from baseline in order to indicate improved functional endurance.     Baseline 168' improvement at STG, another 54' improvement from STG 09-23-16   Status Achieved               Plan - 09/23/16 1959    Clinical Impression Statement Skilled session focused on assessment of LTGs and D/C from therapy.  Pt has met 6/6 LTGs and is ready for D/C.  Pt has returned to community fitness group/program with wife one day a week which will pick back up to 3 times per week in the fall.     PT Frequency 1x / week   PT Duration 8 weeks   PT Treatment/Interventions ADLs/Self Care Home Management;Electrical Stimulation;DME Instruction;Gait training;Stair training;Functional mobility training;Therapeutic activities;Therapeutic exercise;Balance training;Neuromuscular re-education;Patient/family education;Orthotic Fit/Training;Passive range of motion;Vestibular   Consulted and Agree with Plan of Care Patient;Family member/caregiver   Family Member Consulted wife       Patient will benefit from skilled therapeutic intervention in order to improve the following deficits and impairments:  Abnormal gait, Decreased activity tolerance, Decreased balance, Decreased cognition, Decreased endurance, Decreased knowledge of use of DME, Decreased mobility, Decreased range of motion, Decreased strength, Impaired perceived functional ability, Impaired flexibility, Postural dysfunction  Visit  Diagnosis: Unsteadiness on feet  Muscle weakness (generalized)  Other abnormalities of gait and mobility       G-Codes - 2016/09/23 04/14/2002    Functional Assessment Tool Used (Outpatient Only) BERG: 53/56   Functional Limitation Mobility: Walking and moving around   Mobility: Walking and Moving Around Current Status [phone]) At least 1 percent but less than 20 percent impaired, limited or restricted   Mobility: Walking and Moving Around Goal Status [phone]) At least 20 percent but less than 40 percent impaired, limited or restricted   Mobility: Walking and Moving Around Discharge Status [phone]) At least 1 percent but less than 20 percent impaired, limited or restricted      PHYSICAL THERAPY DISCHARGE SUMMARY  Visits from Start of Care: 8  Current functional level related to goals / functional outcomes: See LTGs above   Remaining deficits: Pt with high level balance and endurance deficits, has HEP to address these and is getting back into community fitness program.    Education / Equipment: HEP  Plan: Patient agrees to discharge.  Patient goals were met. Patient is being discharged due to meeting the stated rehab goals.  ?????       Problem List Patient Active Problem List  Diagnosis Date Noted  . Delayed gastric emptying 04/22/2016  . Respiratory failure ([place]) 04/16/2016  . SOB (shortness of breath)   . NSVT (nonsustained ventricular tachycardia) ([place])   . Elevated troponin 04/15/2016  . Acute on chronic diastolic heart failure ([place])   . Gastric dysmotility   . Gastrojejunostomy tube 02/06/2016 03/10/2016  . Hyponatremia   . Anxiety and depression   . Sleep disturbance   . Orthostasis   . Leukocytosis   . Hyperglycemia   . FTT (failure to thrive) in adult   . Anemia of chronic disease 01/31/2016  . Stage 2 skin ulcer of sacral region ([place]) 01/31/2016  . Hypomagnesemia 01/21/2016  . Hypokalemia 01/21/2016  . Pressure injury of skin 01/20/2016  . Pleural effusion    . Gastroesophageal reflux disease 01/19/2016  . Insomnia 01/19/2016  . Skin ulcer of abdominal wall, limited to breakdown of skin ([place]) 01/17/2016  . Dysphagia 01/17/2016  . Debility 01/17/2016  . 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 Vitamin B12 deficiency 01/02/2016  . BPH (benign prostatic hyperplasia)   . Posterior vitreous detachment   . Salzmann's nodular dystrophy of left eye   . Hyperopia of both eyes with astigmatism and presbyopia   . Central pterygium of right eye   . Pressure ulcer of buttock, right, unstageable ([place]) 12/30/2015  . Hypernatremia 12/28/2015  . Tobacco abuse 12/09/2015  . Gastritis 12/09/2015  . Respiratory failure, acute ([place])   . Protein-calorie malnutrition, severe 12/04/2015  . Encephalopathy acute 12/03/2015  . Anxiety state 09/28/2015  . Hyperlipidemia   . 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 Coronary atherosclerosis of native coronary artery 04/24/2013  . Essential hypertension, benign 04/24/2013  . Other and unspecified hyperlipidemia 04/24/2013  . Central pterygium 10/30/2012  . 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 Cataract, nuclear sclerotic senile 10/30/2012    Cameron Sprang, PT, MPT [facility] [address] [place], [place], [address] Phone: [phone]   Fax:  [phone] 09/05/16, 8:05 PM  Name: [name] MRN: [id number] Date of Birth: [birth year]

## 2016-09-07 NOTE — Therapy (Signed)
[facility] [address] [place], [place], [address] Phone: [phone]   Fax:  [phone]  Speech Language Pathology Treatment and Discharge Summary  Patient Details  Name: [name] MRN: [id number] Date of Birth: June 07, 1940 Referring Provider: [name], MD  Encounter Date: 09/05/2016    Past Medical History:  Diagnosis Date  . 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 Aspiration pneumonia (HCC) 01/17/2016  . Bilateral dry eyes   . Bile peritonitis due to gastric tube dislodgement 12/03/2015  . BPH (benign prostatic hyperplasia)   . Central pterygium of right eye   . Chronic respiratory failure with hypoxia (HCC)   . Coronary artery disease 1997, 2009   bare mental stent right coronart artery, occlusive followed by Dr. Garnette Scheuermann in [place]  . Degenerative disc disease    LOWER BACK  . Dysphonia   . ED (erectile dysfunction)   . 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 Gastric outlet obstruction 11/24/2015  . Gastroesophageal reflux disease   . Hyperlipidemia   . Hyperopia of both eyes with astigmatism and presbyopia   . Hypertension   . Ischemic right index finger at tip 12/09/2015  . Nuclear senile cataract    Dr. [facility] in [place]  . Peptic ulcer   . Pneumatosis of intestines 12/11/2015  . Pneumonia    HISTORY OF IN CHILDHOOD  . Posterior vitreous detachment 10/30/2012  . Pyloric stricture 11/24/2015  . Salzmann's nodular dystrophy of left eye   . SBO (small bowel obstruction) (HCC) 01/20/2016  . Urinary hesitancy   . Wears glasses     Past Surgical History:  Procedure Laterality Date  . CARDIAC CATHETERIZATION    . CHOLECYSTECTOMY    . ESOPHAGOGASTRODUODENOSCOPY N/A 12/01/2015   Procedure: ESOPHAGOGASTRODUODENOSCOPY (EGD) BALLOON DILATION OF DUODENAL STRICTURE;  Surgeon: Karie Soda,  MD;  Location: [facility];  Service: General;  Laterality: N/A;  . ESOPHAGOGASTRODUODENOSCOPY N/A 12/14/2015   Procedure: ESOPHAGOGASTRODUODENOSCOPY (EGD);  Surgeon: Vida Rigger, MD;  Location: [facility];  Service: Endoscopy;  Laterality: N/A;  Patient has tracheostomy  . ESOPHAGOGASTRODUODENOSCOPY (EGD) WITH PROPOFOL N/A 07/01/2015   Procedure: ESOPHAGOGASTRODUODENOSCOPY (EGD) WITH PROPOFOL;  Surgeon: Willis Modena, MD;  Location: [facility];  Service: Endoscopy;  Laterality: N/A;  . ESOPHAGOGASTRODUODENOSCOPY (EGD) WITH PROPOFOL N/A 11/23/2015   Procedure: ESOPHAGOGASTRODUODENOSCOPY (EGD) WITH PROPOFOL;  Surgeon: Willis Modena, MD;  Location: [facility];  Service: Endoscopy;  Laterality: N/A;  may need to intubate  . EYE SURGERY     growth on right eye removed   . IR GENERIC HISTORICAL  12/13/2015   IR REPLC DUODEN/JEJUNO TUBE PERCUT W/FLUORO 12/13/2015 [name], MD [facility]  . IR GENERIC HISTORICAL  02/03/2016   IR REPLC GASTRO/COLONIC TUBE PERCUT W/FLUORO 02/03/2016 Berdine Dance, MD [facility]  . IR GENERIC HISTORICAL  02/04/2016   IR GASTR TUBE CONVERT GASTR-JEJ PER W/FL MOD SED 02/04/2016 Malachy Moan, MD [facility]  . IR GENERIC HISTORICAL  02/25/2016   IR SINUS/FIST TUBE CHK-NON GI 02/25/2016 Irish Lack, MD [facility]  . IR GENERIC HISTORICAL  03/18/2016   IR CM INJ ANY COLONIC TUBE W/FLUORO 03/18/2016 Simonne Come, MD [facility]  . IR GENERIC HISTORICAL  03/18/2016   IR GJ TUBE CHANGE 03/18/2016 Simonne Come, MD [facility]  . IR GENERIC HISTORICAL  03/22/2016   IR CM INJ ANY COLONIC TUBE W/FLUORO 03/22/2016 Simonne Come, MD [facility]  . IR GENERIC HISTORICAL  04/13/2016   IR CM INJ ANY COLONIC TUBE W/FLUORO 04/13/2016 Richarda Overlie, MD [facility]  . LAPAROSCOPIC GASTROSTOMY N/A 12/01/2015   Procedure: LAPAROSCOPIC PLACEMENT OF FEEDING JEJUNOSTOMY AND GASTROSTOMY TUBE;  Surgeon: Karie Soda, MD;  Location: [facility];  Service: General;  Laterality: N/A;  . LAPAROSCOPY  N/A 12/03/2015   Procedure: LAPAROSCOPY DIAGNOSTIC, OMENTOPEXY, JEJUNOSTOMY, WASH OUT;  Surgeon: Karie Soda, MD;  Location: [facility];  Service: General;  Laterality: N/A;  . LAPAROTOMY N/A 02/06/2016   Procedure: EXPLORATORY LAPAROTOMY, LYSIS OF ADHESIONS, SEGMENTAL SMALL BOWEL RESECTION, GASTROJEJUNOSTOMY;  Surgeon: Chevis Pretty III, MD;  Location: [facility];  Service: General;  Laterality: N/A;  . STENT TO HEART     2 1997 AND 1 IN 1996  . TONSILLECTOMY    . XI ROBOTIC VAGOTOMY AND ANTRECTOMY N/A 09/25/2015   Procedure: XI ROBOTIC ANTERIOR AND POSTERIOR VAGOTOMY, BILROTH I  ANASTOMOSIS DOR FUNDIPLICATION, OMENTOPEXY  UPPER ENDOSCOPY;  Surgeon: Karie Soda, MD;  Location: [facility];  Service: General;  Laterality: N/A;    There were no vitals filed for this visit.     09/05/16 1405  SLP Visits / Re-Eval  Visit Number 8  Number of Visits 9  Date for SLP Re-Evaluation 09/02/16  SLP Time Calculation  SLP Start Time 1445  SLP Stop Time  1530  SLP Time Calculation (min) 45 min  SLP - End of Session  Activity Tolerance Patient tolerated treatment well       09/05/16 1445  Symptoms/Limitations  Subjective Pt reports he has been tucking his chin when drinking water after brushing his teeth and no longer coughs if he remembers to do so.  Patient is accompained by: Family member  Pain Assessment  Currently in Pain? No/denies      09/05/16 1445  Education  Education provided Yes  PT Education  Education Details Diet recommendation, compensatory strategies, aspiration precautions, ongoing HEP for dysarthria and dysphagia  Person(s) Educated Patient;Spouse  Methods Explanation;Handout  Comprehension Verbalized understanding     09/05/16 1445  General Information  Behavior/Cognition Alert;Cooperative;Pleasant mood  Patient Positioning Upright in chair  Oral care provided N/A  Treatment Provided  Treatment provided Dysphagia;Cognitive-Linquistic  Dysphagia Treatment  Temperature  Spikes Noted No  Respiratory Status Room air  Oral Cavity - Dentition Adequate natural dentition  Treatment Methods Patient/caregiver education  Patient observed directly with PO's No  Other treatment/comments Dysphagia education only. Pt described his HEP and told SLP 3 signs of aspiration pneumonia. SLP provided feedback, written recommendations re: diet, compensatory strategies, ongoing HEP, close monitoring for s/sx of pneumonia and f/u for changes in swallow fx.  Pain Assessment  Pain Assessment No/denies pain  Cognitive-Linquistic Treatment  Treatment focused on Dysarthria  Skilled Treatment Pt demo'd HEP, SLP engaged pt in multiple conversations >5 min. Pt with 75% accuracy for use of intelligibility strategies with initial instruction and occasional min A from his wife.   Assessment / Recommendations / Plan  Plan Discharge SLP treatment due to (comment)  Dysphagia Recommendations  Diet recommendations Regular;Thin liquid  Liquids provided via Cup;Straw  Medication Administration Whole meds with liquid  Supervision Patient able to self feed  Compensations Clear throat intermittently;Effortful swallow  Postural Changes and/or Swallow Maneuvers Chin tuck  General Recommendations  General recommendations Other(comment) (f/u with PCP, neurologist)  Oral Care Recommendations Oral care BID;Oral care before and after PO  Progression Toward Goals  Progression toward goals Goals met, education completed, patient discharged from SLP  General Information  Reason PO's not observed  Other (comment) (education only)     09/05/16 1445  SLP Assessment/Plan  Clinical Impression Statement Pt continues to present with ataxic dysarthria, impaired memory, and dysphagia. He reports use of chin tuck at home with no overt s/sx of aspiration. Demos HEP and compensatory techniques for swallowing and dysarthria today during session. In structured conversations of 5 min, pt utilizes dysarthria strategies  with 75% accuracy with initial instruction. Discussed treatment options including recertification today for an additional 4 weeks, or follow up with MD, neurology as severity of pt?Ts symptoms are not explained by any of his medical diagnoses. At this time pt and his wife state they plan to follow up with primary care provider and may benefit from further neurology consult. Pt to be discharged today, however recommend pt follow up with SLP in 3 months or sooner should pt experience deterioration in intelligibility, cognitive-communication or swallowing function.  Speech Therapy Frequency (d/c)  Duration (d/c)  Treatment/Interventions Aspiration precaution training;Cognitive reorganization;SLP instruction and feedback;Oral motor exercises;Diet toleration management by SLP;Patient/family education;Functional tasks;Pharyngeal strengthening exercises;Environmental controls;Compensatory strategies  Potential to Achieve Goals Good  Potential Considerations Severity of impairments  SLP Home Exercise Plan IMST, EMST, dysarthria strategies  Consulted and Agree with Plan of Care Patient;Family member/caregiver  Family Member Consulted wife        SLP Short Term Goals - 09/05/16 1521      SLP SHORT TERM GOAL #1   Title pt will complete HEP for dysarthria with rare min A over two sessions   Status Achieved     SLP SHORT TERM GOAL #2   Title pt will use compensatory strategies for dysarthria 80% of the time in sentence level responses   Status Not Met     SLP SHORT TERM GOAL #3   Title Pt will participate in assessment of cognitive communication within first 2 sessions.   Status Achieved     SLP SHORT TERM GOAL #4   Title Pt will tell SLP 4 strategies to improve attention, memory.    Status Not Met          SLP Long Term Goals - 09/05/16 1502      SLP LONG TERM GOAL #1   Title pt will demo HEP with modified independence over two sessions   Baseline 7.16.18, 09/05/16   Time 2   Period  Weeks   Status Achieved     SLP LONG TERM GOAL #2   Title Pt will use compensatory strategies for dysarthria to improve intelligiblity to 75% over 5 minutes mod complex conversation.   Time 2   Period Weeks   Status Achieved     SLP LONG TERM GOAL #3   Title pt will tell SLP 3 s/s aspiration PNA with modified independence   Time 1   Period Weeks   Status Achieved     SLP LONG TERM GOAL #4   Title Pt will report demo use of compensatory strategies for recall of daily activities, appointments or details from written materials in 2 therapy sessions.   Time 2   Status Not Met        Patient will benefit from skilled therapeutic intervention in order to improve the following deficits and impairments:   Dysphagia, unspecified type  Dysarthria and anarthria  Cognitive communication deficit    Problem List Patient Active Problem List   Diagnosis Date Noted  . Delayed gastric emptying 04/22/2016  . Respiratory failure (HCC) 04/16/2016  . SOB (shortness of breath)   . NSVT (  nonsustained ventricular tachycardia) (HCC)   . Elevated troponin 04/15/2016  . Acute on chronic diastolic heart failure (HCC)   . Gastric dysmotility   . Gastrojejunostomy tube 02/06/2016 03/10/2016  . Hyponatremia   . Anxiety and depression   . Sleep disturbance   . Orthostasis   . Leukocytosis   . Hyperglycemia   . FTT (failure to thrive) in adult   . Anemia of chronic disease 01/31/2016  . Stage 2 skin ulcer of sacral region (HCC) 01/31/2016  . Hypomagnesemia 01/21/2016  . Hypokalemia 01/21/2016  . Pressure injury of skin 01/20/2016  . Pleural effusion   . Gastroesophageal reflux disease 01/19/2016  . Insomnia 01/19/2016  . Skin ulcer of abdominal wall, limited to breakdown of skin (HCC) 01/17/2016  . Dysphagia 01/17/2016  . Debility 01/17/2016  . 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 Vitamin B12 deficiency 01/02/2016  . BPH (benign prostatic hyperplasia)   . Posterior vitreous  detachment   . Salzmann's nodular dystrophy of left eye   . Hyperopia of both eyes with astigmatism and presbyopia   . Central pterygium of right eye   . Pressure ulcer of buttock, right, unstageable (HCC) 12/30/2015  . Hypernatremia 12/28/2015  . Tobacco abuse 12/09/2015  . Gastritis 12/09/2015  . Respiratory failure, acute (HCC)   . Protein-calorie malnutrition, severe 12/04/2015  . Encephalopathy acute 12/03/2015  . Anxiety state 09/28/2015  . Hyperlipidemia   . 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 Coronary atherosclerosis of native coronary artery 04/24/2013  . Essential hypertension, benign 04/24/2013  . Other and unspecified hyperlipidemia 04/24/2013  . Central pterygium 10/30/2012  . 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 Cataract, nuclear sclerotic senile 10/30/2012   SPEECH THERAPY DISCHARGE SUMMARY  Visits from Start of Care: 8  Current functional level related to goals / functional outcomes: Pt achieved 2/4 short term goals and 3/4 long term goals. He demonstrates ability to use compensatory techniques to improve swallowing function and intelligibility, though he occasionally requires external cues to do so.    Remaining deficits: Pt continues to display deficits in swallowing function requiring use of compensatory maneuvers to prevent aspiration, reduced intelligibility, and impairments in attention, memory.   Education / Equipment: Home exercise program provided as well as education re: swallowing precautions and aspiration risks. Recommend follow-up with neurology re: persisting dysarthria, dysphagia and cognitive communication deficits. Plan: Patient agrees to discharge.  Patient goals were partially met. Patient is being discharged due to the patient's request.  ?????    Rondel Baton, [place], CCC-SLP Speech-Language Pathologist   Arlana Lindau 09/07/2016, 7:36 PM  [facility] [address] [place], [place], [address] Phone: [phone]   Fax:  [phone]   Name: [name] MRN: [id number] Date of Birth: 1940-07-08

## 2016-10-05 ENCOUNTER — Ambulatory Visit: Payer: Medicare Other | Admitting: Neurology

## 2017-03-30 ENCOUNTER — Ambulatory Visit: Payer: Medicare Other | Admitting: Neurology

## 2017-04-25 ENCOUNTER — Ambulatory Visit: Payer: Medicare Other | Admitting: Neurology

## 2017-04-25 ENCOUNTER — Encounter: Payer: Self-pay | Admitting: Neurology

## 2017-04-25 VITALS — BP 122/59 | HR 58 | Ht 72.0 in | Wt 177.0 lb

## 2017-04-25 DIAGNOSIS — R471 Dysarthria and anarthria: Secondary | ICD-10-CM | POA: Diagnosis not present

## 2017-04-25 DIAGNOSIS — R1310 Dysphagia, unspecified: Secondary | ICD-10-CM | POA: Diagnosis not present

## 2017-04-25 HISTORY — DX: Dysarthria and anarthria: R47.1

## 2017-04-25 NOTE — Progress Notes (Signed)
Reason for visit: Dysarthria, dysphagia  [name] is an 76 y.o. male  History of present illness:  [name] is a 77 year old right-handed white male with a history of problems with dysarthria and dysphagia issues.  The patient has last been seen about a year ago.  Over the last year he has had some improvement in the vocal cord function, his speech is no longer raspy or hypophonic.  The patient continues to have significant problems with dysarthria and dysphagia.  The patient has been able to gain 10 pounds of weight since last seen.  He is eating relatively well.  He does have to be careful about how he swallows his food.  The patient has had good strength in the arms and legs.  Prior EMG and nerve conduction study did not show evidence of an anterior horn cell disease process, this has been a consideration.  Blood work has been unremarkable, MRI of the brain has not shown ischemic changes that would explain his speech.  The etiology of his swallowing problems has not been delineated.  He returns for an evaluation.  Past Medical History:  Diagnosis Date  . 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 Aspiration pneumonia (HCC) 01/17/2016  . Bilateral dry eyes   . Bile peritonitis due to gastric tube dislodgement 12/03/2015  . BPH (benign prostatic hyperplasia)   . Central pterygium of right eye   . Chronic respiratory failure with hypoxia (HCC)   . Coronary artery disease 1997, 2009   bare mental stent right coronart artery, occlusive followed by Dr. Garnette Scheuermann in [place]  . Degenerative disc disease    LOWER BACK  . Dysphonia   . ED (erectile dysfunction)   . 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 Gastric outlet obstruction 11/24/2015  . Gastroesophageal reflux disease   . Hyperlipidemia   . Hyperopia of both eyes with astigmatism and presbyopia   . Hypertension   . Ischemic right index  finger at tip 12/09/2015  . Nuclear senile cataract    [facility]. [facility] in [place]  . Peptic ulcer   . Pneumatosis of intestines 12/11/2015  . Pneumonia    HISTORY OF IN CHILDHOOD  . Posterior vitreous detachment 10/30/2012  . Pyloric stricture 11/24/2015  . Salzmann's nodular dystrophy of left eye   . SBO (small bowel obstruction) (HCC) 01/20/2016  . Urinary hesitancy   . Wears glasses     Past Surgical History:  Procedure Laterality Date  . CARDIAC CATHETERIZATION    . CHOLECYSTECTOMY    . ESOPHAGOGASTRODUODENOSCOPY N/A 12/01/2015   Procedure: ESOPHAGOGASTRODUODENOSCOPY (EGD) BALLOON DILATION OF DUODENAL STRICTURE;  Surgeon: Karie Soda, MD;  [facility];  Service: General;  Laterality: N/A;  . ESOPHAGOGASTRODUODENOSCOPY N/A 12/14/2015   Procedure: ESOPHAGOGASTRODUODENOSCOPY (EGD);  Surgeon: Vida Rigger, MD;  Location: [facility];  Service: Endoscopy;  Laterality: N/A;  Patient has tracheostomy  . ESOPHAGOGASTRODUODENOSCOPY (EGD) WITH PROPOFOL N/A 07/01/2015   Procedure: ESOPHAGOGASTRODUODENOSCOPY (EGD) WITH PROPOFOL;  Surgeon:  Modena, MD;  [facility];  Service: Endoscopy;  Laterality: N/A;  . ESOPHAGOGASTRODUODENOSCOPY (EGD) WITH PROPOFOL N/A 11/23/2015   Procedure: ESOPHAGOGASTRODUODENOSCOPY (EGD) WITH PROPOFOL;  Surgeon:  Modena, MD;  Location: [facility];  Service: Endoscopy;  Laterality: N/A;  may need to intubate  . EYE SURGERY     growth on right eye removed   . IR GENERIC HISTORICAL  12/13/2015   IR REPLC DUODEN/JEJUNO TUBE PERCUT W/FLUORO 12/13/2015 Merton Border  Hoss, MD [facility]  . IR GENERIC HISTORICAL  02/03/2016   IR REPLC GASTRO/COLONIC TUBE PERCUT W/FLUORO 02/03/2016 Berdine Dance, MD [facility]  . IR GENERIC HISTORICAL  02/04/2016   IR GASTR TUBE CONVERT GASTR-JEJ PER W/FL MOD SED 02/04/2016 Malachy Moan, MD [facility]  . IR GENERIC HISTORICAL  02/25/2016   IR SINUS/FIST TUBE CHK-NON GI 02/25/2016 Irish Lack, MD [facility]  . IR GENERIC HISTORICAL  03/18/2016   IR CM INJ ANY COLONIC TUBE W/FLUORO 03/18/2016 Simonne Come, MD [facility]  . IR GENERIC HISTORICAL  03/18/2016   IR GJ TUBE CHANGE 03/18/2016 Simonne Come, MD [facility]  . IR GENERIC HISTORICAL  03/22/2016   IR CM INJ ANY COLONIC TUBE W/FLUORO 03/22/2016 Simonne Come, MD [facility]  . IR GENERIC HISTORICAL  04/13/2016   IR CM INJ ANY COLONIC TUBE W/FLUORO 04/13/2016 Richarda Overlie, MD [facility]  . LAPAROSCOPIC GASTROSTOMY N/A 12/01/2015   Procedure: LAPAROSCOPIC PLACEMENT OF FEEDING JEJUNOSTOMY AND GASTROSTOMY TUBE;  Surgeon: Karie Soda, MD;  [facility];  Service: General;  Laterality: N/A;  . LAPAROSCOPY N/A 12/03/2015   Procedure: LAPAROSCOPY DIAGNOSTIC, OMENTOPEXY, JEJUNOSTOMY, WASH OUT;  Surgeon: Karie Soda, MD;  [facility];  Service: General;  Laterality: N/A;  . LAPAROTOMY N/A 02/06/2016   Procedure: EXPLORATORY LAPAROTOMY, LYSIS OF ADHESIONS, SEGMENTAL SMALL BOWEL RESECTION, GASTROJEJUNOSTOMY;  Surgeon: Chevis Pretty III, MD;  [facility];  Service: General;  Laterality: N/A;  . STENT TO HEART     2 1997 AND 1 IN 1996  . TONSILLECTOMY    . XI ROBOTIC VAGOTOMY AND ANTRECTOMY N/A 09/25/2015   Procedure: XI ROBOTIC ANTERIOR AND POSTERIOR VAGOTOMY, BILROTH I  ANASTOMOSIS DOR FUNDIPLICATION, OMENTOPEXY  UPPER ENDOSCOPY;  Surgeon: Karie Soda, MD;  [facility];  Service: General;  Laterality: N/A;    Family History  Problem Relation Age of Onset  . Heart attack Mother     Social history:  reports that he quit smoking about 22 years ago. His smoking use included cigarettes. He has a 40.00 pack-year smoking history. he has never used smokeless tobacco. He reports that he does not drink alcohol or use drugs.    Allergies  Allergen Reactions  . Flomax [Tamsulosin Hcl] Other (See Comments)    Leg weakness  . Lisinopril Cough  . Lorazepam Other (See Comments)    "i don't like it"  Prefers Xanax or valium  .  Uroxatral [Alfuzosin Hcl Er] Other (See Comments)    Leg weakness    Medications:  Prior to Admission medications   Medication Sig Start Date End Date Taking? Authorizing Provider  aspirin 81 MG tablet 81 mg by Per J Tube route daily.   Yes [provider]  chlorhexidine (PERIDEX) 0.12 % solution 15 mLs by Mouth Rinse route 2 (two) times daily. 03/28/16  Yes Love, Evlyn Kanner, PA-C  cycloSPORINE (RESTASIS) 0.05 % ophthalmic emulsion Place 1 drop into both eyes 2 (two) times daily. 02/26/16  Yes Focht, Jessica L, PA  FLUoxetine (PROZAC) 20 MG/5ML solution GIVE INTO FEEDING TUBE ONCE DAILY. Patient taking differently: GIVE INTO FEEDING TUBE ONCE DAILY. (10mg  daily) 04/14/16  Yes [name], Maryln Gottron, MD  ipratropium-albuterol (DUONEB) 0.5-2.5 (3) MG/3ML SOLN Take 3 mLs by nebulization every 4 (four) hours as needed. 04/22/16  Yes Leroy Sea, MD  lidocaine (LIDODERM) 5 % Apply to back at 7 pm and remove at 7 am daily 03/28/16  Yes Love, Evlyn Kanner, PA-C  lip  balm (CARMEX) ointment Apply 1 application topically 2 (two) times daily. 02/26/16  Yes Focht, Jessica L, PA  liver oil-zinc oxide (DESITIN) 40 % ointment Apply topically 2 (two) times daily. 02/26/16  Yes Focht, Joyce Copa, PA  magic mouthwash SOLN Take 15 mLs by mouth 4 (four) times daily as needed for mouth pain (sore throat). 03/28/16  Yes Love, Evlyn Kanner, PA-C  menthol-cetylpyridinium (CEPACOL) 3 MG lozenge Take 1 lozenge (3 mg total) by mouth as needed for sore throat. 02/26/16  Yes Focht, Joyce Copa, PA  Menthol-Methyl Salicylate (MUSCLE RUB) 10-15 % CREA Apply 1 application topically 3 (three) times daily before meals. 03/28/16  Yes Love, Evlyn Kanner, PA-C  mouth rinse LIQD solution 15 mLs by Mouth Rinse route 2 times daily at 12 noon and 4 pm. 02/26/16  Yes Focht, Joyce Copa, PA  Multiple Vitamin (MULTIVITAMIN) LIQD Place 15 mLs into feeding tube daily. 03/31/16  Yes Love, Evlyn Kanner, PA-C  nitroGLYCERIN (NITROSTAT) 0.4 MG SL tablet Place 1  tablet (0.4 mg total) under the tongue every 5 (five) minutes as needed for chest pain. 02/26/16  Yes Focht, Jessica L, PA  Nutritional Supplements (FEEDING SUPPLEMENT, JEVITY 1.5 CAL/FIBER,) LIQD Place 474 mLs into feeding tube 3 (three) times daily. Use this dose once patient has tolerated the gradual advance dose well and is upto 2 cans at a time. 04/23/16  Yes Leroy Sea, MD  nystatin (MYCOSTATIN) 100000 UNIT/ML suspension Place 30 mLs into feeding tube 4 (four) times daily. 04/13/16  Yes [provider]  omeprazole (PRILOSEC) 2 mg/mL SUSP Place 20 mLs (40 mg total) into feeding tube daily. 03/29/16  Yes Love, Evlyn Kanner, PA-C  polyvinyl alcohol (LIQUIFILM TEARS) 1.4 % ophthalmic solution Place 1 drop into both eyes every 8 (eight) hours as needed (dry). 02/26/16  Yes Focht, Joyce Copa, PA  prochlorperazine (COMPAZINE) 25 MG suppository Place 1 suppository (25 mg total) rectally every 12 (twelve) hours as needed for nausea or vomiting. 03/30/16  Yes Love, Evlyn Kanner, PA-C  sodium chloride flush (NS) 0.9 % SOLN 10-40 mLs by Intracatheter route as needed (flush). 02/26/16  Yes Focht, Joyce Copa, PA  Water For Irrigation, Sterile (FREE WATER) SOLN Flush feeding tube with 100cc water after each tube feed 04/22/16  Yes Leroy Sea, MD    ROS:  Out of a complete 14 system review of symptoms, the patient complains only of the following symptoms, and all other reviewed systems are negative.  Fatigue Difficulty swallowing Back pain Memory loss, speech difficulty Confusion, decreased concentration  Blood pressure (!) 122/59, pulse (!) 58, height 6' (1.829 m), weight 177 lb (80.3 kg).  Physical Exam  General: The patient is alert and cooperative at the time of the examination.  Skin: No significant peripheral edema is noted.   Neurologic Exam  Mental status: The patient is alert and oriented x 3 at the time of the examination. The patient has apparent normal recent and remote memory,  with an apparently normal attention span and concentration ability.   Cranial nerves: Facial symmetry is present. Speech is slow, monosyllabic, and dysarthric. Extraocular movements are full. Visual fields are full.  Motor: The patient has good strength in all 4 extremities.  Sensory examination: Soft touch sensation is symmetric on the face, arms, and legs.  Coordination: The patient has good finger-nose-finger and heel-to-shin bilaterally.  Gait and station: The patient has a normal gait. Tandem gait is unsteady. Romberg is negative. No drift is seen.  Reflexes: Deep tendon  reflexes are symmetric.   Assessment/Plan:  1.  Dysarthria, dysphasia  The exact etiology of his speech problems is not clear.  The patient has had improvement in vocal cord function since last seen.  He continues to have isolated dysphagia and dysarthria issues.  He has been able to gain weight, he has no weakness of the extremities.  The stability of the speech and swallowing problem tends to go against the diagnosis of ALS, but the patient will need to be followed over time for this issue.  The patient will follow-up in about 6 months.  Marlan Palau. Keith Serine Kea MD 04/25/2017 12:24 PM  [facility] [address] [place] [id number]  Phone [phone] Fax [phone]

## 2017-10-11 IMAGING — DX DG ABD PORTABLE 1V
1 series · 1 of 1 positions shown · non-contrast
Comparison: 12/02/2015

CLINICAL DATA: Evaluate percutaneous jejunostomy tube.

EXAM:
PORTABLE ABDOMEN - 1 VIEW

[abdomen kub]
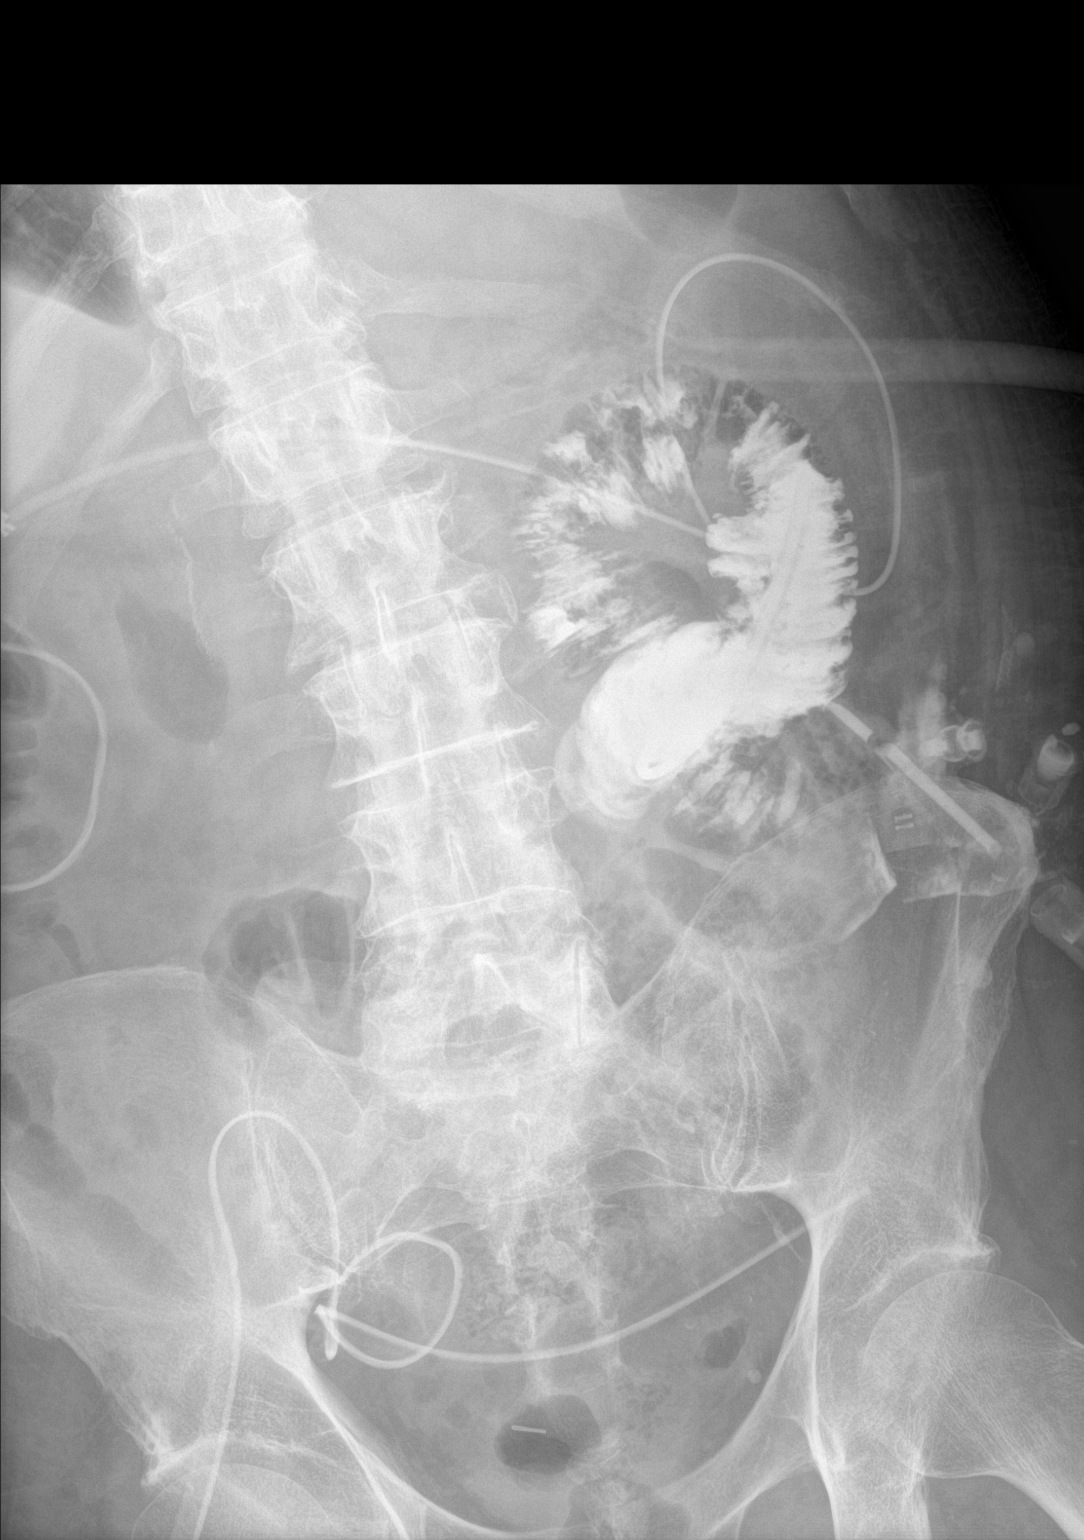

[1 of 1 positions shown; findings below may reference images not displayed]

FINDINGS: There is oral contrast in the jejunum in the left upper quadrant. I
do not see any definite leaking intraperitoneal contrast. There is
some aching contrast laterally which appears to be in the abdominal
wall.
IMPRESSION: Leakage of contrast near the percutaneous site, likely in the
abdominal wall. No definite intraperitoneal leakage of contrast.

## 2017-10-13 IMAGING — DX DG CHEST 1V PORT
1 series · 2 of 2 positions shown · non-contrast
Comparison: 12/08/2015.

CLINICAL DATA: Respiratory failure.

EXAM:
PORTABLE CHEST 1 VIEW

[Series 1: chest ap · 0.14mm/px · 2 of 2 slices shown]
[im 1/2]
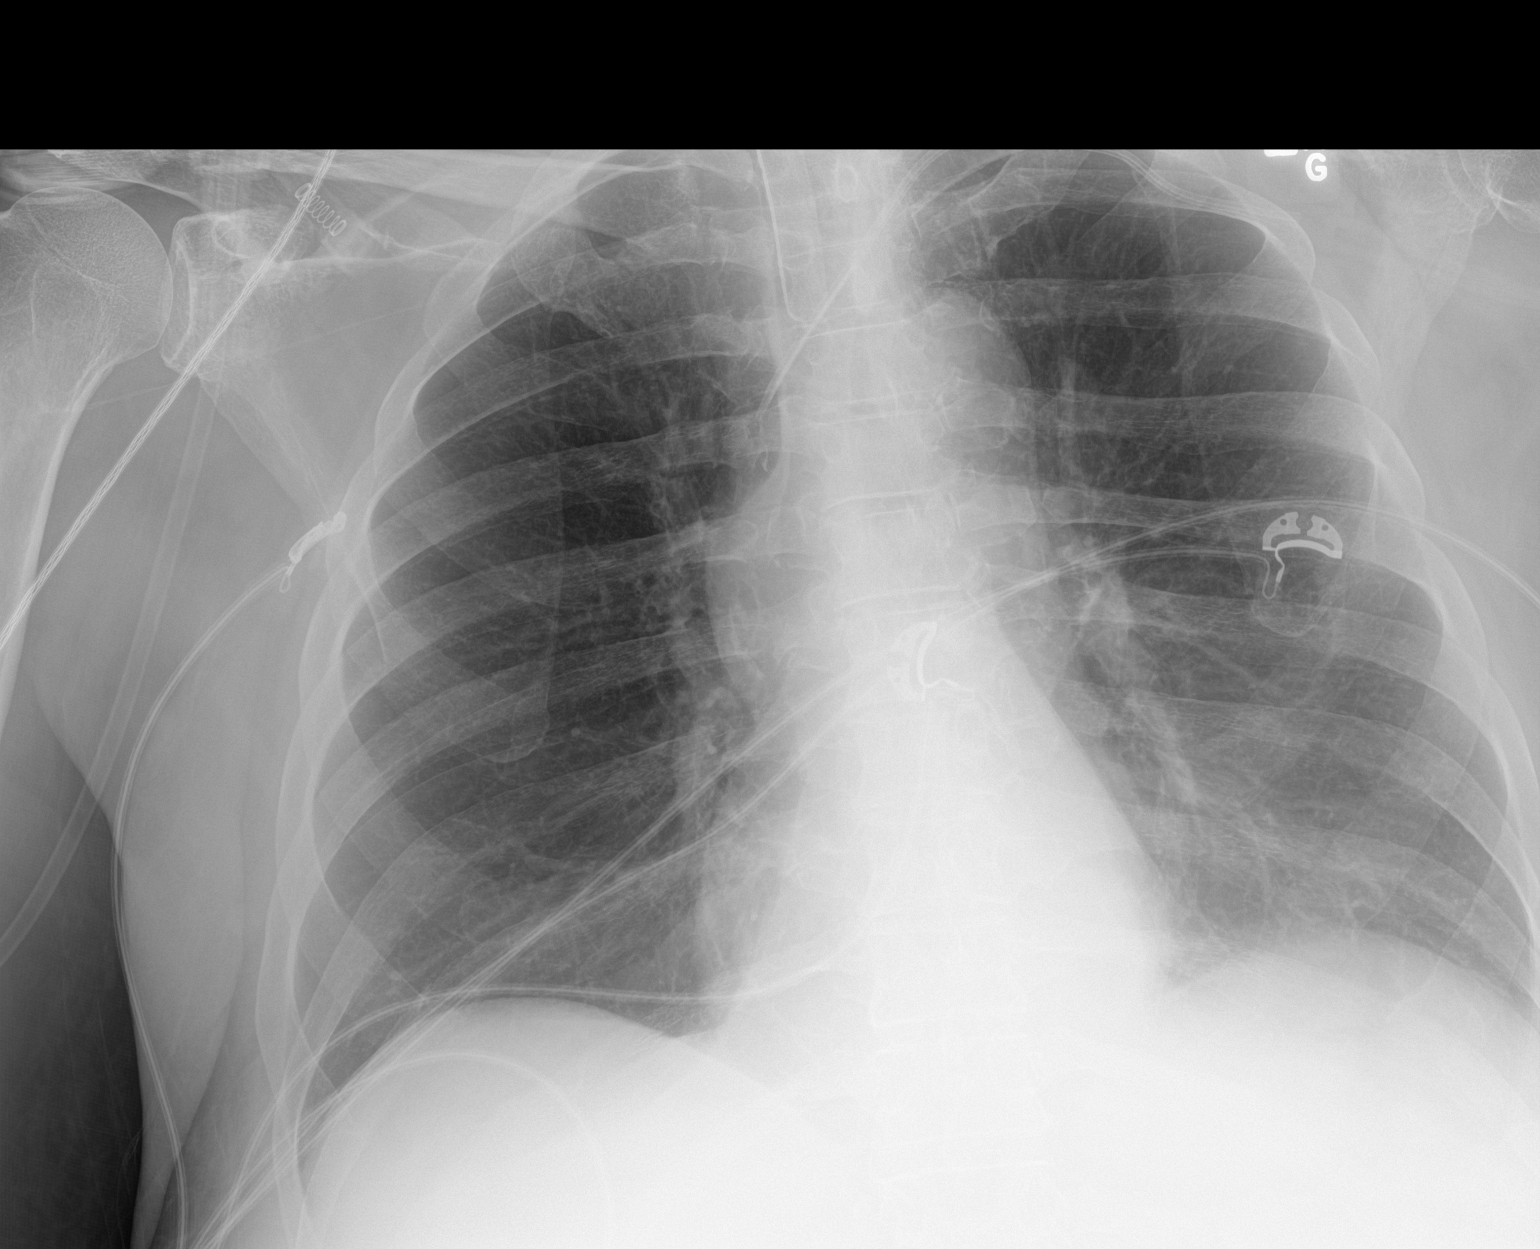
[im 2/2]
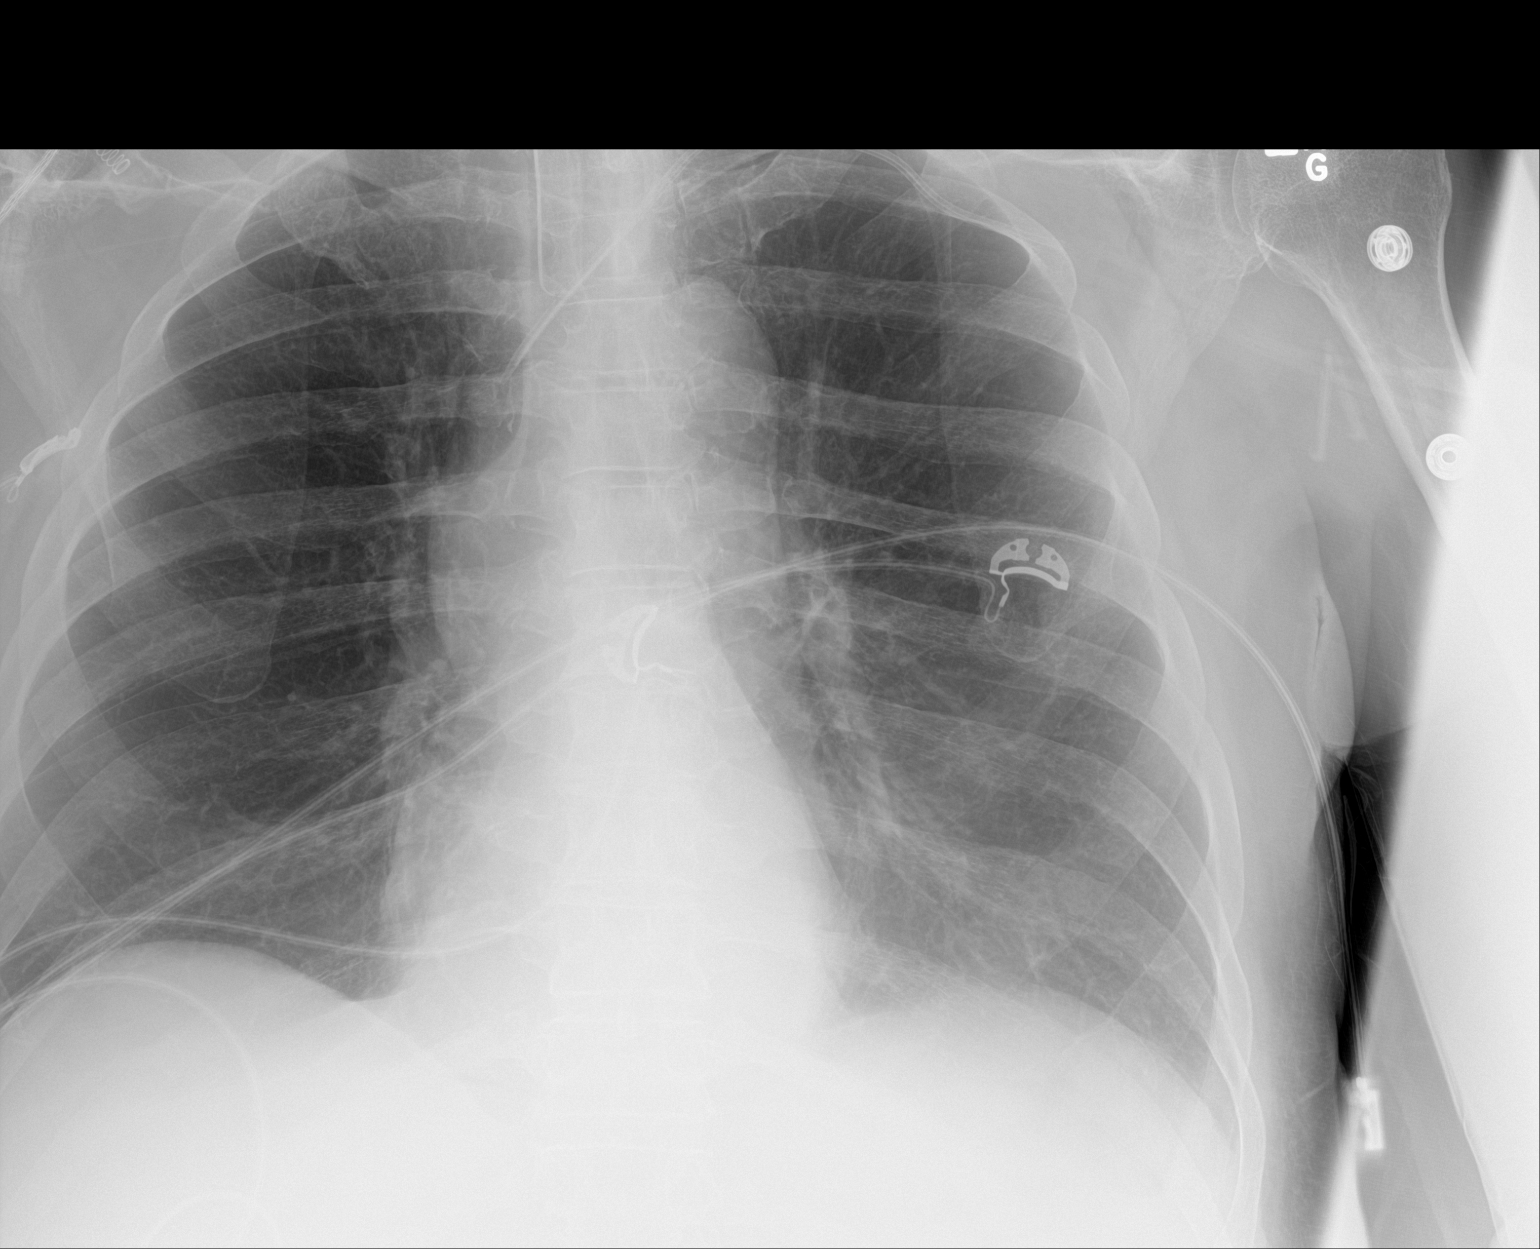

[2 of 2 positions shown; findings below may reference images not displayed]

FINDINGS: Endotracheal tube and left subclavian line stable position. Heart
size stable. Persistent atelectatic changes left lung base.
Associated left lower lobe infiltrate cannot be excluded. No
prominent pleural effusion or pneumothorax.
IMPRESSION: 1. Lines and tubes in stable position.

2. Persistent atelectatic changes left lung base. Mild left base
infiltrate cannot be excluded. No change from prior exam .

## 2017-10-13 IMAGING — DX DG CHEST 1V PORT
1 series · 1 of 1 positions shown · non-contrast
Comparison: 12/09/2015

CLINICAL DATA: Readjusted ETT tube

EXAM:
PORTABLE CHEST 1 VIEW

[chest ap]
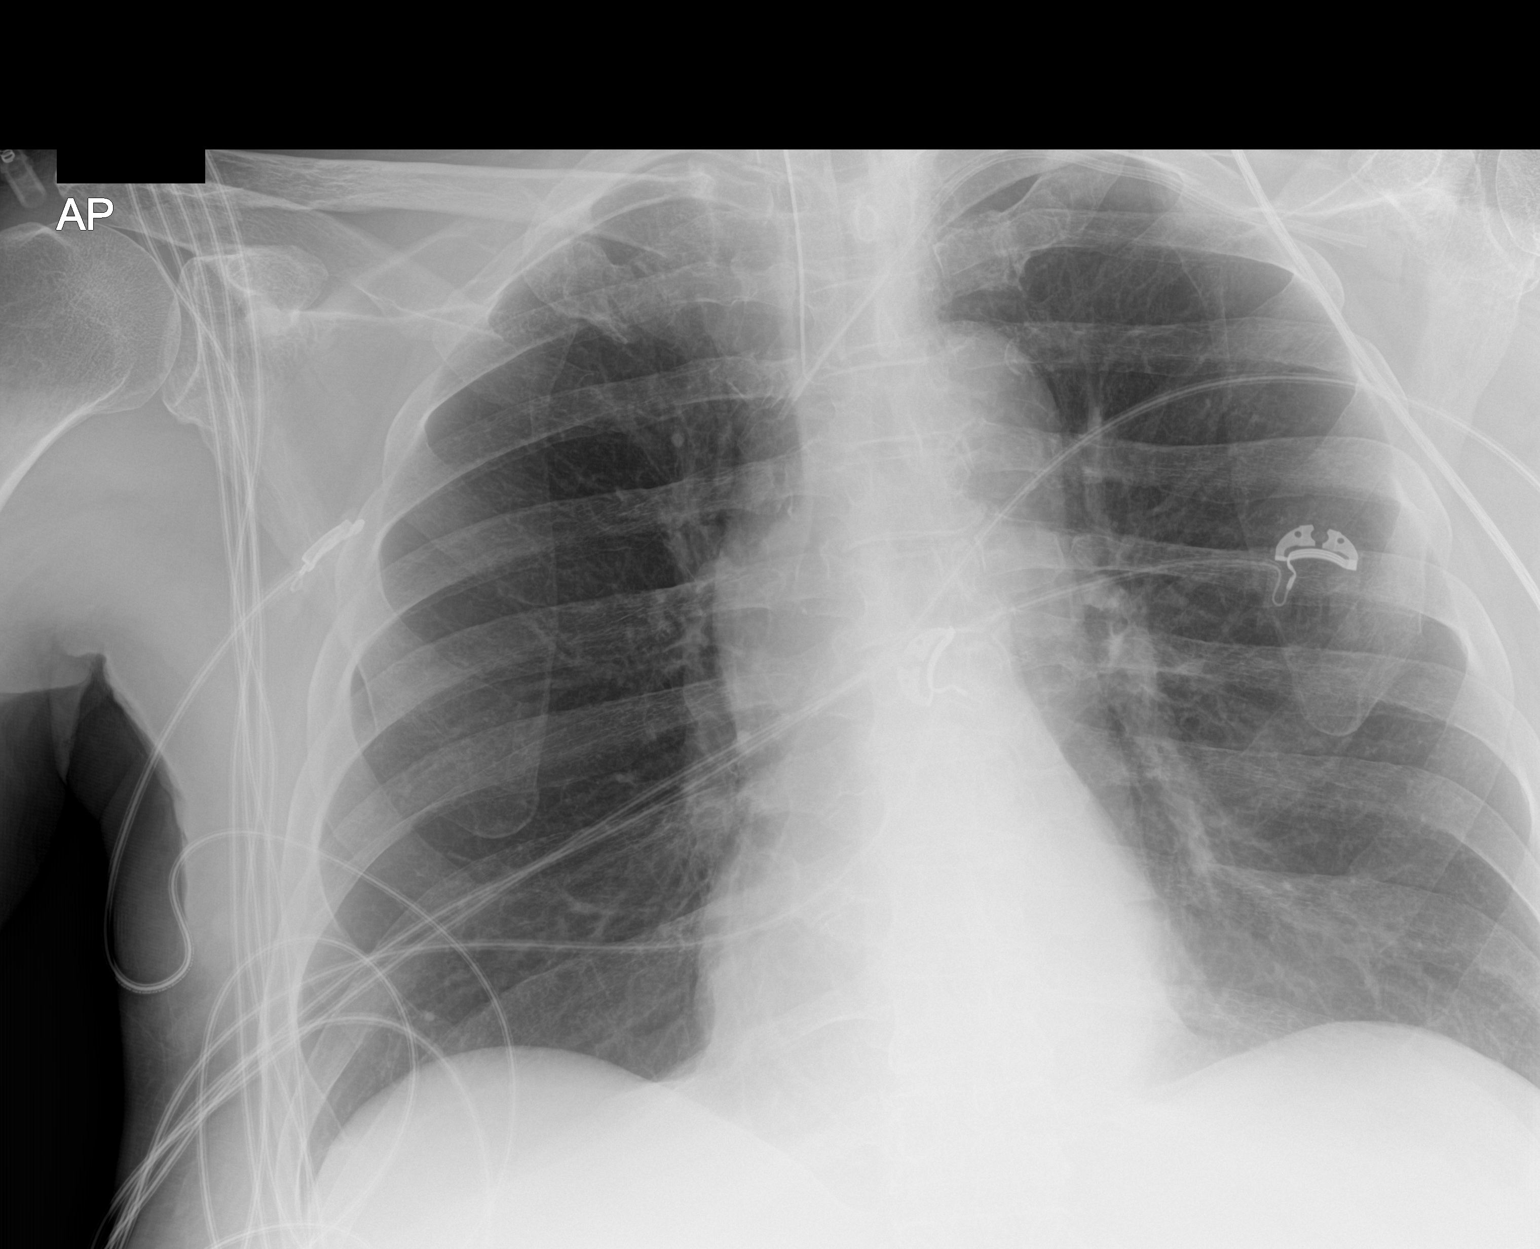

[1 of 1 positions shown; findings below may reference images not displayed]

FINDINGS: Endotracheal tube tip is approximately 4.7 cm superior to the
carina. Left-sided central venous catheter tip overlies the SVC.
Left CP angle is not included.

Hazy left base atelectasis or infiltrate unchanged. Stable
cardiomediastinal silhouette. No pneumothorax.
IMPRESSION: 1. Support lines and tubes as above
2. Stable hazy left basilar atelectasis or infiltrate

## 2017-10-17 IMAGING — XA IR REPLC DUODEN/JEJUNO TUBE PERCUT W/FLUORO
1 series · 2 of 2 positions shown · non-contrast
Comparison: none

INDICATION: Jejunostomy tube pulled out

[Series 300: tube placements · 2 of 2 slices shown]
[im 1/2]
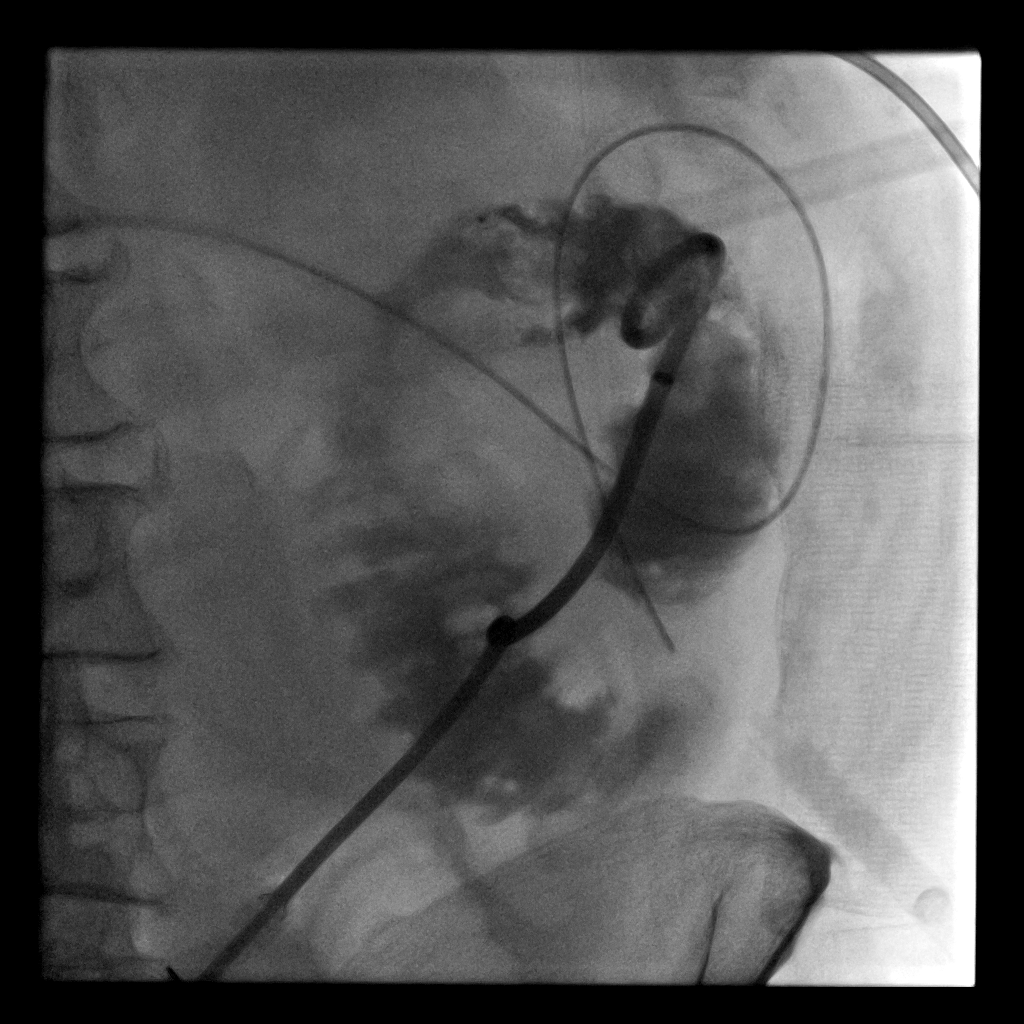
[im 2/2]
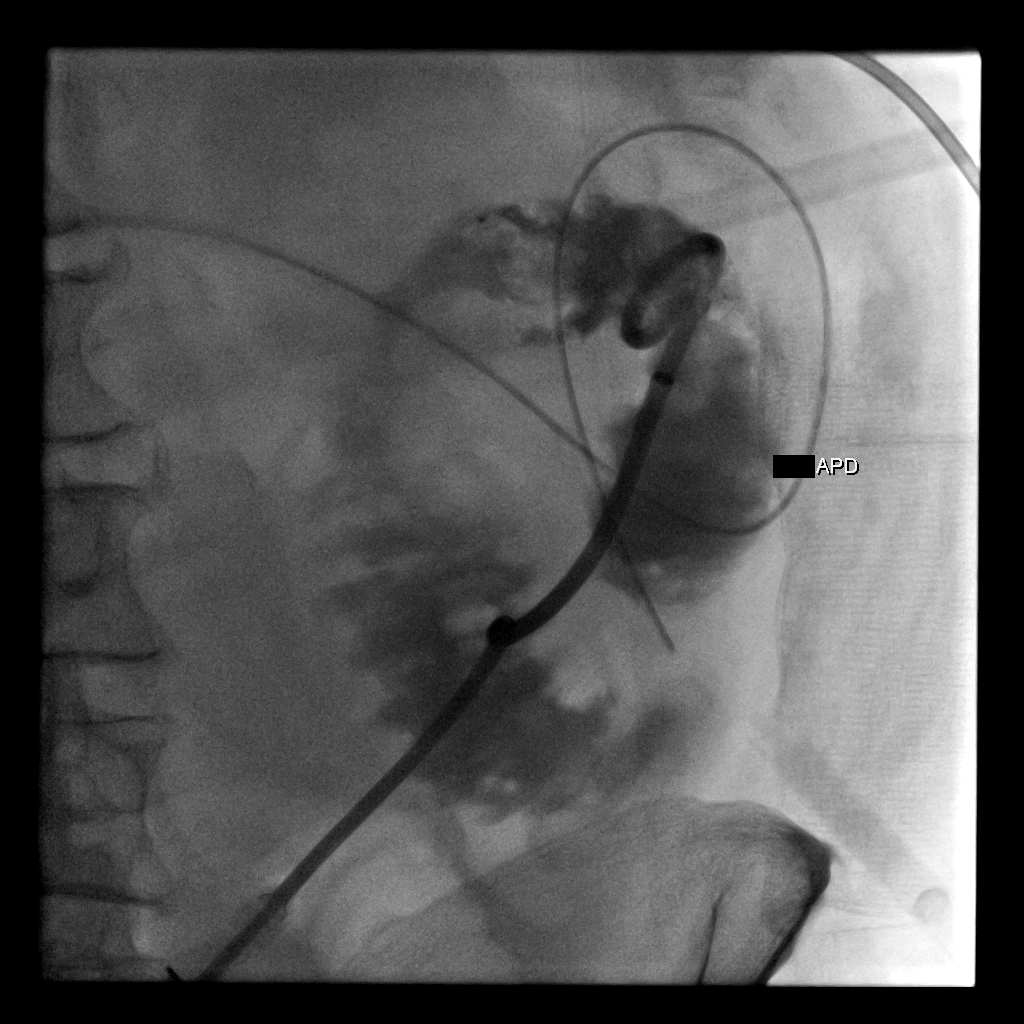

[2 of 2 positions shown; findings below may reference images not displayed]

EXAM:
IR REPLACE DUODEN/JEJUNO TUBE PERCUT WITH FLOURO

MEDICATIONS:
None

ANESTHESIA/SEDATION:
None

CONTRAST:  15mL 6JFO8L-YSS IOPAMIDOL (6JFO8L-YSS) INJECTION 61% -
administered into the gastric lumen.

FLUOROSCOPY TIME:  Fluoroscopy Time: 1 minutes 30 seconds (11.4
mGy).

COMPLICATIONS:
None immediate.

PROCEDURE:
Informed written consent was obtained from the patient after a
thorough discussion of the procedural risks, benefits and
alternatives. All questions were addressed. Maximal Sterile Barrier
Technique was utilized including caps, mask, sterile gowns, sterile
gloves, sterile drape, hand hygiene and skin antiseptic. A timeout
was performed prior to the initiation of the procedure.

The jejunostomy tube was completely pulled out. A Kumpe catheter was
advanced into the jejunostomy tube entry site. Contrast was
injected. The catheter was advanced over Christelle Reich into the loop of
jejunum. The Kumpe be was exchanged over Christelle Reich for a 12 French
pigtail catheter. This was advanced into the jejunum, looped, and
string fixed, then sewn to the skin. Contrast was injected. It was
then attached to a gravity drainage bag.
FINDINGS: Imaging confirms replacement of the jejunostomy tube in the left
lower quadrant as described.
IMPRESSION: Successful left lower quadrant jejunostomy tube replacement.

## 2017-10-31 ENCOUNTER — Ambulatory Visit: Payer: Medicare Other | Admitting: Neurology

## 2017-10-31 ENCOUNTER — Encounter: Payer: Self-pay | Admitting: Neurology

## 2017-10-31 VITALS — BP 116/59 | HR 64 | Ht 72.0 in | Wt 169.1 lb

## 2017-10-31 DIAGNOSIS — R471 Dysarthria and anarthria: Secondary | ICD-10-CM | POA: Diagnosis not present

## 2017-10-31 MED ORDER — MEMANTINE HCL 5 MG PO TABS: ORAL_TABLET | ORAL | 0 refills | Status: DC

## 2017-10-31 NOTE — Patient Instructions (Signed)
We will start Namenda for the memory, watch out for dizziness.

## 2017-10-31 NOTE — Progress Notes (Signed)
Reason for visit: Dysarthria, memory disturbance  [name] is an 77 y.o. male  History of present illness:  [name] is a 77 year old right-handed white male with a history of what he claims was a relatively sudden onset of problems with speech and swallowing that began in February 2017.  The patient eventually had MRI of the brain done in May 2017, this did not show evidence of a stroke.  The patient has had ongoing problems with dysarthria and dysphagia that has been remained stable over the last 2-1/2 years.  EMG nerve conduction study evaluation did not show any evidence of an anterior horn cell disease process.  The patient continues to have some troubles with memory, his wife believes that this is getting worse over time.  The patient has been losing some weight, he is lost 8 pounds over the last 6 months.  The patient returns to the office today for an evaluation.  He is not on any medications for memory.  The patient needs assistance keeping up with medications and appointments, he no longer does the finances, he does operate a motor vehicle.  Past Medical History:  Diagnosis Date  . 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 Aspiration pneumonia (HCC) 01/17/2016  . Bilateral dry eyes   . Bile peritonitis due to gastric tube dislodgement 12/03/2015  . BPH (benign prostatic hyperplasia)   . Central pterygium of right eye   . Chronic respiratory failure with hypoxia (HCC)   . Coronary artery disease 1997, 2009   bare mental stent right coronart artery, occlusive followed by [name] in [place]  . Degenerative disc disease    LOWER BACK  . Dysarthria 04/25/2017  . Dysphonia   . ED (erectile dysfunction)   . 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 Gastric outlet obstruction 11/24/2015  . Gastroesophageal reflux disease   . Hyperlipidemia   . Hyperopia of both eyes with astigmatism and  presbyopia   . Hypertension   . Ischemic right index finger at tip 12/09/2015  . Nuclear senile cataract    [name] [place]  . Peptic ulcer   . Pneumatosis of intestines 12/11/2015  . Pneumonia    HISTORY OF IN CHILDHOOD  . Posterior vitreous detachment 10/30/2012  . Pyloric stricture 11/24/2015  . Salzmann's nodular dystrophy of left eye   . SBO (small bowel obstruction) (HCC) 01/20/2016  . Urinary hesitancy   . Wears glasses     Past Surgical History:  Procedure Laterality Date  . CARDIAC CATHETERIZATION    . CHOLECYSTECTOMY    . ESOPHAGOGASTRODUODENOSCOPY N/A 12/01/2015   Procedure: ESOPHAGOGASTRODUODENOSCOPY (EGD) BALLOON DILATION OF DUODENAL STRICTURE;  Surgeon: Danny Soda, MD;  [facility];  Service: General;  Laterality: N/A;  . ESOPHAGOGASTRODUODENOSCOPY N/A 12/14/2015   Procedure: ESOPHAGOGASTRODUODENOSCOPY (EGD);  Surgeon: Danny Rigger, MD;  Location: [facility];  Service: Endoscopy;  Laterality: N/A;  Patient has tracheostomy  . ESOPHAGOGASTRODUODENOSCOPY (EGD) WITH PROPOFOL N/A 07/01/2015   Procedure: ESOPHAGOGASTRODUODENOSCOPY (EGD) WITH PROPOFOL;  Surgeon:  Modena, MD;  [facility];  Service: Endoscopy;  Laterality: N/A;  . ESOPHAGOGASTRODUODENOSCOPY (EGD) WITH PROPOFOL N/A 11/23/2015   Procedure: ESOPHAGOGASTRODUODENOSCOPY (EGD) WITH PROPOFOL;  Surgeon:  Modena, MD;  Location: [facility] ENDOSCOPY;  Service: Endoscopy;  Laterality: N/A;  may need to intubate  . EYE SURGERY     growth on right eye removed   . IR GENERIC HISTORICAL  12/13/2015   IR  REPLC DUODEN/JEJUNO TUBE PERCUT W/FLUORO 12/13/2015 Danny Click, MD [facility]  . IR GENERIC HISTORICAL  02/03/2016   IR REPLC GASTRO/COLONIC TUBE PERCUT W/FLUORO 02/03/2016 Danny Dance, MD [facility]  . IR GENERIC HISTORICAL  02/04/2016   IR GASTR TUBE CONVERT GASTR-JEJ PER W/FL MOD SED 02/04/2016 Danny Moan, MD [facility]  . IR GENERIC HISTORICAL  02/25/2016     IR SINUS/FIST TUBE CHK-NON GI 02/25/2016 Danny Lack, MD [facility]  . IR GENERIC HISTORICAL  03/18/2016   IR CM INJ ANY COLONIC TUBE W/FLUORO 03/18/2016 Danny Come, MD [facility]  . IR GENERIC HISTORICAL  03/18/2016   IR GJ TUBE CHANGE 03/18/2016 Danny Come, MD [facility]  . IR GENERIC HISTORICAL  03/22/2016   IR CM INJ ANY COLONIC TUBE W/FLUORO 03/22/2016 Danny Come, MD [facility]  . IR GENERIC HISTORICAL  04/13/2016   IR CM INJ ANY COLONIC TUBE W/FLUORO 04/13/2016 Danny Overlie, MD [facility]  . LAPAROSCOPIC GASTROSTOMY N/A 12/01/2015   Procedure: LAPAROSCOPIC PLACEMENT OF FEEDING JEJUNOSTOMY AND GASTROSTOMY TUBE;  Surgeon: Danny Soda, MD;  [facility];  Service: General;  Laterality: N/A;  . LAPAROSCOPY N/A 12/03/2015   Procedure: LAPAROSCOPY DIAGNOSTIC, OMENTOPEXY, JEJUNOSTOMY, WASH OUT;  Surgeon: Danny Soda, MD;  [facility];  Service: General;  Laterality: N/A;  . LAPAROTOMY N/A 02/06/2016   Procedure: EXPLORATORY LAPAROTOMY, LYSIS OF ADHESIONS, SEGMENTAL SMALL BOWEL RESECTION, GASTROJEJUNOSTOMY;  Surgeon: Danny Pretty III, MD;  [facility]: [facility];  Service: General;  Laterality: N/A;  . STENT TO HEART     2 1997 AND 1 IN 1996  . TONSILLECTOMY    . XI ROBOTIC VAGOTOMY AND ANTRECTOMY N/A 09/25/2015   Procedure: XI ROBOTIC ANTERIOR AND POSTERIOR VAGOTOMY, BILROTH I  ANASTOMOSIS DOR FUNDIPLICATION, OMENTOPEXY  UPPER ENDOSCOPY;  Surgeon: Danny Soda, MD;  Location: [facility];  Service: General;  Laterality: N/A;    Family History  Problem Relation Age of Onset  . Heart attack Mother     Social history:  reports that he quit smoking about 22 years ago. His smoking use included cigarettes. He has a 40.00 pack-year smoking history. He has never used smokeless tobacco. He reports that he does not drink alcohol or use drugs.    Allergies  Allergen Reactions  . Flomax [Tamsulosin Hcl] Other (See Comments)    Leg weakness  . Lisinopril Cough  . Lorazepam Other (See Comments)     "i don't like it"  Prefers Xanax or valium  . Uroxatral [Alfuzosin Hcl Er] Other (See Comments)    Leg weakness    Medications:  Prior to Admission medications   Medication Sig Start Date End Date Taking? Authorizing Provider  aspirin 81 MG tablet 81 mg by Per J Tube route daily.   Yes [provider]  FLUoxetine (PROZAC) 10 MG capsule 20 mg.  10/11/17  Yes [provider]  Multiple Vitamin (MULTI-VITAMINS) TABS 1 tablet every morning.   Yes [provider]  nitroGLYCERIN (NITROSTAT) 0.4 MG SL tablet Place 1 tablet (0.4 mg total) under the tongue every 5 (five) minutes as needed for chest pain. 02/26/16  Yes Focht, Joyce Copa, PA  pantoprazole (PROTONIX) 40 MG tablet  10/19/17  Yes [provider]  pravastatin (PRAVACHOL) 40 MG tablet  09/06/17  Yes [provider]    ROS:  Out of a complete 14 system review of symptoms, the patient complains only of the following symptoms, and all other reviewed systems are negative.  Fatigue, weight loss Hearing  loss, runny nose, trouble swallowing Choking Snoring Back pain Memory loss, speech difficulty, weakness Confusion, decreased concentration  Blood pressure (!) 116/59, pulse 64, height 6' (1.829 m), weight 169 lb 1.6 oz (76.7 kg).  Physical Exam  General: The patient is alert and cooperative at the time of the examination.  Skin: No significant peripheral edema is noted.   Neurologic Exam  Mental status: The patient is alert and oriented x 3 at the time of the examination. The patient has apparent normal recent and remote memory, with an apparently normal attention span and concentration ability.   Cranial nerves: Facial symmetry is present. Speech is dysarthric, not a phasic. Extraocular movements are full. Visual fields are full.  Range of motion of the tongue is full, good strength of the tongue.  Motor: The patient has good strength in all 4 extremities.  Sensory examination: Soft  touch sensation is symmetric on the face, arms, and legs.  Coordination: The patient has good finger-nose-finger and heel-to-shin bilaterally.  Gait and station: The patient has a normal gait. Tandem gait is unsteady. Romberg is negative. No drift is seen.  Reflexes: Deep tendon reflexes are symmetric.   Assessment/Plan:  1.  Dysarthria  2.  Memory disturbance  The patient will be started on Namenda, they will call for a maintenance dose prescription if he is able to tolerate the medication.  The patient indicates a rapid onset of the dysarthria, this which is suggestive of a stroke event that may have been missed on MRI of the brain.  The patient has no evidence of an anterior horn cell disease process.  He will follow-up in 6 months.  The patient has remained stable with his speech and swallowing.  Marlan Palau. Keith Gaius Ishaq MD 10/31/2017 12:31 PM  [facility] [address] [place] [address]  Phone [phone] Fax [phone]

## 2017-10-31 NOTE — Progress Notes (Signed)
Faxed printed/signed rx [facility] 5mg  [facility] at [phone]. Received fax confirmation.

## 2017-12-01 ENCOUNTER — Other Ambulatory Visit: Payer: Self-pay | Admitting: Internal Medicine

## 2017-12-01 ENCOUNTER — Ambulatory Visit
Admission: RE | Admit: 2017-12-01 | Discharge: 2017-12-01 | Disposition: A | Discharge status: Home / Self Care | Payer: Medicare Other | Source: Ambulatory Visit | Attending: Internal Medicine | Admitting: Internal Medicine

## 2017-12-01 DIAGNOSIS — R05 Cough: Secondary | ICD-10-CM

## 2017-12-01 DIAGNOSIS — R059 Cough, unspecified: Secondary | ICD-10-CM

## 2017-12-04 ENCOUNTER — Telehealth: Payer: Self-pay | Admitting: Neurology

## 2017-12-04 MED ORDER — MEMANTINE HCL 10 MG PO TABS: 10.0000 mg | ORAL_TABLET | Freq: Two times a day (BID) | ORAL | 5 refills | Status: DC

## 2017-12-04 NOTE — Telephone Encounter (Signed)
Spoke with wife, who sts. pt. completed Namenda titration and tolerated it well.  Would like new rx. for 10mg  bid sent to [facility], with request for pharmacy to deliver the rx.  Rx. sent as requested/fim

## 2017-12-04 NOTE — Telephone Encounter (Signed)
Pts wife(Jean) called stating the medication memantine (NAMENDA) 5 MG tablet has caused no s/e and pt would like to continue medication

## 2018-05-14 ENCOUNTER — Telehealth: Payer: Self-pay

## 2018-05-14 NOTE — Telephone Encounter (Signed)
I contacted the pt and Due to current COVID 19 pandemic, our office is severely reducing in office visits for at least the next 2 weeks, in order to minimize the risk to our patients and healthcare providers.   Pt was advised we could do a telephone visit and has ben scheduled for 05/17/18 at 2:30 pm.   Pt understands that although there may be some limitations with this type of visit, we will take all precautions to reduce any security or privacy concerns.  Pt understands that this will be treated like an in office visit and we will file with pt's insurance, and there may be a patient responsible charge related to this service.

## 2018-05-17 ENCOUNTER — Ambulatory Visit (INDEPENDENT_AMBULATORY_CARE_PROVIDER_SITE_OTHER): Payer: Medicare Other | Admitting: Neurology

## 2018-05-17 ENCOUNTER — Other Ambulatory Visit: Payer: Self-pay

## 2018-05-17 ENCOUNTER — Encounter: Payer: Self-pay | Admitting: Neurology

## 2018-05-17 DIAGNOSIS — R471 Dysarthria and anarthria: Secondary | ICD-10-CM

## 2018-05-17 MED ORDER — MEMANTINE HCL 10 MG PO TABS: 10.0000 mg | ORAL_TABLET | Freq: Two times a day (BID) | ORAL | 3 refills | Status: DC

## 2018-05-17 MED ORDER — MIRTAZAPINE 15 MG PO TABS: 15.0000 mg | ORAL_TABLET | Freq: Every day | ORAL | 2 refills | Status: DC

## 2018-05-17 NOTE — Progress Notes (Signed)
     Virtual Visit via Telephone Note  I connected with [name] on 05/17/18 at  2:30 PM EDT by telephone and verified that I am speaking with the correct person using two identifiers.   I discussed the limitations, risks, security and privacy concerns of performing an evaluation and management service by telephone and the availability of in person appointments. I also discussed with the patient that there may be a patient responsible charge related to this service. The patient expressed understanding and agreed to proceed.   History of Present Illness: [name] is a 78 year old right-handed white male with a history of dysarthria.  Etiology of dysarthria is not clear, it has not progressed much over about 3 years, he does have some mild dysphagia at times.  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  The patient is sleeping fairly well at night, he seems somewhat sleepy during the day.  He currently is off Prozac.  The patient is on Namenda for his memory problems which seem to have gotten worse slowly over time, he has significant short-term memory issues according to the wife.  The patient is walking fairly well, no significant balance issues have been noted.  No change in strength or sensation has been noted.  The patient has a current weight of 159 pounds, down 10 pounds from September 2019.   Observations/Objective: On the telephone interview, the patient has a slow deliberate speech, significant dysarthria is noted, no aphasia.  He is answering questions appropriately.  Assessment and Plan: 1.  Dysarthria  2.  Memory disorder  The patient is continued to have some progression of his memory.  He is losing weight, this may in part be related to a "dumping syndrome", but the wife indicates that his appetite has also decreased.  We will add low-dose Remeron at night.  The patient will  continue the Namenda, a prescription was sent in.  He will follow-up in about 5 or 6 months.  Follow Up Instructions: 16-month follow-up with me.   I discussed the assessment and treatment plan with the patient. The patient was provided an opportunity to ask questions and all were answered. The patient agreed with the plan and demonstrated an understanding of the instructions.   The patient was advised to call back or seek an in-person evaluation if the symptoms worsen or if the condition fails to improve as anticipated.  I provided 25 minutes of non-face-to-face time during this encounter.   York Spaniel, MD

## 2018-05-18 ENCOUNTER — Ambulatory Visit: Payer: Medicare Other | Admitting: Neurology

## 2018-09-04 ENCOUNTER — Telehealth: Payer: Self-pay | Admitting: Neurology

## 2018-09-04 NOTE — Telephone Encounter (Addendum)
I reached out to the pt's wife. I advised I would be on the look out for the pt's forms, we have not received as of yet. Wife wanted to know if the mirtazapine 15 mg could be causing the pt to sleep most of the day? Best call back # is [phone].

## 2018-09-04 NOTE — Telephone Encounter (Addendum)
FYI Pt wife has called to inform and for Dr Jannifer Franklin and RN to be on the lookout for paperwork re: pt about a [place] from [place].  Wife has asked that Dr Jannifer Franklin be aware that pt's primary Dr Laurann Montana will also be filling out the same paperwork re: assisting them in getting the policy.  Wife is asking that RN calls her to discuss memantine (NAMENDA) 10 MG tablet and mirtazapine (REMERON) 15 MG tablet.  Wife wants to know which of the 2 could cause pt to sleep so much

## 2018-09-04 NOTE — Addendum Note (Signed)
Addended by: Kathrynn Ducking on: 09/04/2018 12:58 PM   Modules accepted: Orders

## 2018-09-04 NOTE — Telephone Encounter (Signed)
I called the wife.  The mirtazapine is the likely source of the drowsiness, they are to stop the medication, this was initially was started because of poor appetite.  In regards to his abilities with activities of daily living, he is unable to fix himself a meal, he cannot drive a car, he requires some supervision with bathing and dressing.  The patient is no longer wanting to do personal care activities such as shaving.  The wife has gotten an Environmental consultant in the house to help out.

## 2018-09-13 ENCOUNTER — Telehealth: Payer: Self-pay | Admitting: Neurology

## 2018-09-13 NOTE — Telephone Encounter (Signed)
error 

## 2018-11-08 ENCOUNTER — Ambulatory Visit (INDEPENDENT_AMBULATORY_CARE_PROVIDER_SITE_OTHER): Payer: Medicare Other | Admitting: Neurology

## 2018-11-08 ENCOUNTER — Encounter: Payer: Self-pay | Admitting: Neurology

## 2018-11-08 ENCOUNTER — Other Ambulatory Visit: Payer: Self-pay

## 2018-11-08 VITALS — BP 138/60 | HR 95 | Temp 97.7°F | Ht 72.0 in | Wt 149.0 lb

## 2018-11-08 DIAGNOSIS — R413 Other amnesia: Secondary | ICD-10-CM | POA: Diagnosis not present

## 2018-11-08 DIAGNOSIS — R471 Dysarthria and anarthria: Secondary | ICD-10-CM | POA: Diagnosis not present

## 2018-11-08 MED ORDER — DIVALPROEX SODIUM 125 MG PO DR TAB: DELAYED_RELEASE_TABLET | ORAL | 3 refills | Status: DC

## 2018-11-08 NOTE — Patient Instructions (Signed)
We will start depakote 125 mg capsule twice a day for 3 weeks, then take 2 capsules twice a day.  Depakote (valproic acid) is a seizure medication that also has an FDA approval for migraine headache. The most common potential side effects of this medication include weight gain, tremor, or possible stomach upset. This medication can potentially cause liver problems. If confusion is noted on this medication, contact our office immediately.

## 2018-11-08 NOTE — Progress Notes (Signed)
Reason for visit: Dysarthria, memory disturbance  [name] is an 78 y.o. male  History of present illness:  [name] is a 78 year old right-handed white male with a history of dysarthria that has been present since 2017.  The patient had onset of issues around the time he had a peptic ulcer and was quite sick, he was intubated and required tracheotomy.  The patient did require feeding tube placement, he has unfortunately continued to lose weight.  Over the last 12 months, he is lost another 20 pounds of weight.  He is having some good days with appetite but usually is not eating well at all.  He continues to have dysarthria that has remained stable, he denies any dysphasia issues with liquids or solids.  He continues to have decline in memory, he needs supervision with bathing and dressing.  He does not operate a motor vehicle.  He requires an Geophysicist/field seismologist in the house to help him day-to-day.  Unfortunately, he recently had the COVID virus infection, his wife almost died from this, but she has fortunately started to recover.  The patient returns to the office today for an evaluation.  He at times will have days where he is not walking well, other days he does better, he has not had any falls.  Past Medical History:  Diagnosis Date  . 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 Aspiration pneumonia (HCC) 01/17/2016  . Bilateral dry eyes   . Bile peritonitis due to gastric tube dislodgement 12/03/2015  . BPH (benign prostatic hyperplasia)   . Central pterygium of right eye   . Chronic respiratory failure with hypoxia (HCC)   . Coronary artery disease 1997, 2009   bare mental stent right coronart artery, occlusive followed by Dr. Garnette Scheuermann in [place]  . Degenerative disc disease    LOWER BACK  . Dysarthria 04/25/2017  . Dysphonia   . ED (erectile dysfunction)   . 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  Gastric outlet obstruction 11/24/2015  . Gastroesophageal reflux disease   . Hyperlipidemia   . Hyperopia of both eyes with astigmatism and presbyopia   . Hypertension   . Ischemic right index finger at tip 12/09/2015  . Nuclear senile cataract    Dr. [facility] in [place]  . Peptic ulcer   . Pneumatosis of intestines 12/11/2015  . Pneumonia    HISTORY OF IN CHILDHOOD  . Posterior vitreous detachment 10/30/2012  . Pyloric stricture 11/24/2015  . Salzmann's nodular dystrophy of left eye   . SBO (small bowel obstruction) (HCC) 01/20/2016  . Urinary hesitancy   . Wears glasses     Past Surgical History:  Procedure Laterality Date  . CARDIAC CATHETERIZATION    . CHOLECYSTECTOMY    . ESOPHAGOGASTRODUODENOSCOPY N/A 12/01/2015   Procedure: ESOPHAGOGASTRODUODENOSCOPY (EGD) BALLOON DILATION OF DUODENAL STRICTURE;  Surgeon: Karie Soda, MD;  Location: [facility];  Service: General;  Laterality: N/A;  . ESOPHAGOGASTRODUODENOSCOPY N/A 12/14/2015   Procedure: ESOPHAGOGASTRODUODENOSCOPY (EGD);  Surgeon: Vida Rigger, MD;  Location: [facility];  Service: Endoscopy;  Laterality: N/A;  Patient has tracheostomy  . ESOPHAGOGASTRODUODENOSCOPY (EGD) WITH PROPOFOL N/A 07/01/2015   Procedure: ESOPHAGOGASTRODUODENOSCOPY (EGD) WITH PROPOFOL;  Surgeon:  Modena, MD;  Location: [facility];  Service: Endoscopy;  Laterality: N/A;  . ESOPHAGOGASTRODUODENOSCOPY (EGD) WITH PROPOFOL N/A 11/23/2015   Procedure: ESOPHAGOGASTRODUODENOSCOPY (EGD) WITH PROPOFOL;  Surgeon:  Modena, MD;  Location: [facility] END[facility];  Service: Endoscopy;  Laterality: N/A;  may need to intubate  . EYE SURGERY     growth on right eye removed   . IR GENERIC HISTORICAL  12/13/2015   IR REPLC DUODEN/JEJUNO TUBE PERCUT W/FLUORO 12/13/2015 Jolaine ClickArthur Hoss, MD [facility]  . IR GENERIC HISTORICAL  02/03/2016   IR REPLC GASTRO/COLONIC TUBE PERCUT W/FLUORO 02/03/2016 Berdine DanceMichael Shick, MD [facility]  . IR GENERIC HISTORICAL   02/04/2016   IR GASTR TUBE CONVERT GASTR-JEJ PER W/FL MOD SED 02/04/2016 Malachy MoanHeath McCullough, MD [facility]  . IR GENERIC HISTORICAL  02/25/2016   IR SINUS/FIST TUBE CHK-NON GI 02/25/2016 Irish LackGlenn Yamagata, MD [facility]  . IR GENERIC HISTORICAL  03/18/2016   IR CM INJ ANY COLONIC TUBE W/FLUORO 03/18/2016 Simonne ComeJohn Watts, MD [facility]  . IR GENERIC HISTORICAL  03/18/2016   IR GJ TUBE CHANGE 03/18/2016 Simonne ComeJohn Watts, MD [facility]  . IR GENERIC HISTORICAL  03/22/2016   IR CM INJ ANY COLONIC TUBE W/FLUORO 03/22/2016 Simonne ComeJohn Watts, MD [facility]  . IR GENERIC HISTORICAL  04/13/2016   IR CM INJ ANY COLONIC TUBE W/FLUORO 04/13/2016 Richarda OverlieAdam Henn, MD [facility]  . LAPAROSCOPIC GASTROSTOMY N/A 12/01/2015   Procedure: LAPAROSCOPIC PLACEMENT OF FEEDING JEJUNOSTOMY AND GASTROSTOMY TUBE;  Surgeon: Karie SodaSteven Gross, MD;  Location: [facility];  Service: General;  Laterality: N/A;  . LAPAROSCOPY N/A 12/03/2015   Procedure: LAPAROSCOPY DIAGNOSTIC, OMENTOPEXY, JEJUNOSTOMY, WASH OUT;  Surgeon: Karie SodaSteven Gross, MD;  Location: [facility];  Service: General;  Laterality: N/A;  . LAPAROTOMY N/A 02/06/2016   Procedure: EXPLORATORY LAPAROTOMY, LYSIS OF ADHESIONS, SEGMENTAL SMALL BOWEL RESECTION, GASTROJEJUNOSTOMY;  Surgeon: Chevis PrettyPaul Toth III, MD;  Location: [facility];  Service: General;  Laterality: N/A;  . STENT TO HEART     2 1997 AND 1 IN 1996  . TONSILLECTOMY    . XI ROBOTIC VAGOTOMY AND ANTRECTOMY N/A 09/25/2015   Procedure: XI ROBOTIC ANTERIOR AND POSTERIOR VAGOTOMY, BILROTH I  ANASTOMOSIS DOR FUNDIPLICATION, OMENTOPEXY  UPPER ENDOSCOPY;  Surgeon: Karie SodaSteven Gross, MD;  Location: [facility];  Service: General;  Laterality: N/A;    Family History  Problem Relation Age of Onset  . Heart attack Mother     Social history:  reports that he quit smoking about 23 years ago. His smoking use included cigarettes. He has a 40.00 pack-year smoking history. He has never used smokeless tobacco. He reports that he does not drink alcohol or use drugs.    Allergies   Allergen Reactions  . Flomax [Tamsulosin Hcl] Other (See Comments)    Leg weakness  . Lisinopril Cough  . Lorazepam Other (See Comments)    "i don't like it"  Prefers Xanax or valium  . Uroxatral [Alfuzosin Hcl Er] Other (See Comments)    Leg weakness    Medications:  Prior to Admission medications   Medication Sig Start Date End Date Taking? Authorizing Provider  aspirin 81 MG tablet 81 mg by Per J Tube route daily.    [provider]  memantine (NAMENDA) 10 MG tablet Take 1 tablet (10 mg total) by mouth 2 (two) times daily. 05/17/18   York SpanielWillis, Antony Sian K, MD  Multiple Vitamin (MULTI-VITAMINS) TABS 1 tablet every morning.    [provider]  nitroGLYCERIN (NITROSTAT) 0.4 MG SL tablet Place 1 tablet (0.4 mg total) under the tongue every 5 (five) minutes as needed for chest pain. 02/26/16   Jerre SimonFocht, Jessica L, PA  pantoprazole (PROTONIX) 40 MG tablet  10/19/17   [provider]  pravastatin (PRAVACHOL) 40 MG tablet  09/06/17  [provider]    ROS:  Out of a complete 14 system review of symptoms, the patient complains only of the following symptoms, and all other reviewed systems are negative.  Memory disturbance Speech problems Decreased appetite, weight loss  Blood pressure 138/60, pulse 95, temperature 97.7 F (36.5 C), temperature source Temporal, height 6' (1.829 m), weight 149 lb (67.6 kg), SpO2 97 %.  Physical Exam  General: The patient is alert and cooperative at the time of the examination.  Skin: No significant peripheral edema is noted.   Neurologic Exam  Mental status: The patient is alert and oriented x 3 at the time of the examination. The Mini-Mental status examination done today shows a total score 23/30.   Cranial nerves: Facial symmetry is present. Speech is dysarthric, not aphasic. Extraocular movements are full. Visual fields are full.  Motor: The patient has good strength in all 4 extremities.  Sensory examination: Soft  touch sensation is symmetric on the face, arms, and legs.  Coordination: The patient has good finger-nose-finger and heel-to-shin bilaterally.  Gait and station: The patient has a normal gait. Tandem gait is minimally unsteady. Romberg is negative. No drift is seen.  Reflexes: Deep tendon reflexes are symmetric.   Assessment/Plan:  1.  Dysarthria, stable  2.  Progressive memory disturbance  The patient is on Namenda, he will not be placed on Aricept secondary to ongoing weight loss.  He has continued to lose another 20 pounds over the last 12 months.  He was placed on mirtazapine for appetite but he had too much drowsiness on this and the medication was stopped.  We will try him on Depakote to boost appetite starting at 125 mg twice daily for 2 weeks then go to 250 mg twice daily.  The family will call for any dose adjustments.  He will follow-up for a revisit in 6 months.  Jill Alexanders MD 11/08/2018 11:51 AM  [facility] [address] [place], [place] [address]  Phone [phone] Fax [phone]

## 2019-03-17 ENCOUNTER — Ambulatory Visit: Payer: Medicare Other

## 2019-03-22 ENCOUNTER — Other Ambulatory Visit: Payer: Self-pay | Admitting: Neurology

## 2019-03-22 ENCOUNTER — Other Ambulatory Visit: Payer: Self-pay

## 2019-03-22 MED ORDER — DIVALPROEX SODIUM 125 MG PO DR TAB: DELAYED_RELEASE_TABLET | ORAL | 6 refills | Status: DC

## 2019-03-23 ENCOUNTER — Ambulatory Visit: Payer: Medicare Other | Attending: Internal Medicine

## 2019-03-23 DIAGNOSIS — Z23 Encounter for immunization: Secondary | ICD-10-CM

## 2019-03-23 NOTE — Progress Notes (Signed)
   Covid-19 Vaccination Clinic  Name:  [name]    MRN: [id number] DOB: 20-May-1940  03/23/2019  [name] was observed post Covid-19 immunization for 15 minutes without incidence. He was provided with Vaccine Information Sheet and instruction to access the V-Safe system.   [name] was instructed to call 911 with any severe reactions post vaccine: [name] Difficulty breathing  . Swelling of your face and throat  . A fast heartbeat  . A bad rash all over your body  . Dizziness and weakness    Immunizations Administered    Name Date Dose VIS Date Route   Pfizer COVID-19 Vaccine 03/23/2019 10:30 AM 0.3 mL 01/25/2019 Intramuscular   Manufacturer: [employer], [employer]   Lot: [id number]   NDC: [address]-0

## 2019-03-28 ENCOUNTER — Ambulatory Visit: Payer: Medicare Other

## 2019-04-17 ENCOUNTER — Ambulatory Visit: Payer: Medicare Other | Attending: Internal Medicine

## 2019-04-17 DIAGNOSIS — Z23 Encounter for immunization: Secondary | ICD-10-CM | POA: Insufficient documentation

## 2019-04-17 NOTE — Progress Notes (Signed)
   Covid-19 Vaccination Clinic  Name:  [name]    MRN: [id number] DOB: 27-Aug-1940  04/17/2019  Mr. [name] was observed post Covid-19 immunization for 15 minutes without incident. He was provided with Vaccine Information Sheet and instruction to access the V-Safe system.   Mr. [name] was instructed to call 911 with any severe reactions post vaccine: [name] Difficulty breathing  . Swelling of face and throat  . A fast heartbeat  . A bad rash all over body  . Dizziness and weakness   Immunizations Administered    Name Date Dose VIS Date Route   Pfizer COVID-19 Vaccine 04/17/2019 10:30 AM 0.3 mL 01/25/2019 Intramuscular   Manufacturer: [employer], [employer]   Lot: [id number]   NDC: [address]-0

## 2019-05-14 ENCOUNTER — Ambulatory Visit: Payer: Medicare Other | Admitting: Neurology

## 2019-06-10 ENCOUNTER — Other Ambulatory Visit: Payer: Self-pay | Admitting: Neurology

## 2019-09-16 ENCOUNTER — Other Ambulatory Visit: Payer: Self-pay | Admitting: Neurology

## 2019-10-15 ENCOUNTER — Ambulatory Visit: Payer: Medicare Other | Admitting: Neurology

## 2019-10-15 ENCOUNTER — Other Ambulatory Visit: Payer: Self-pay | Admitting: Neurology

## 2019-10-15 ENCOUNTER — Other Ambulatory Visit: Payer: Self-pay

## 2019-10-15 ENCOUNTER — Encounter: Payer: Self-pay | Admitting: Neurology

## 2019-10-15 VITALS — BP 139/58 | HR 55 | Ht 72.0 in | Wt 162.0 lb

## 2019-10-15 DIAGNOSIS — R471 Dysarthria and anarthria: Secondary | ICD-10-CM | POA: Diagnosis not present

## 2019-10-15 DIAGNOSIS — R413 Other amnesia: Secondary | ICD-10-CM | POA: Diagnosis not present

## 2019-10-15 MED ORDER — ESCITALOPRAM OXALATE 5 MG PO TABS: 5.0000 mg | ORAL_TABLET | Freq: Every day | ORAL | 3 refills | Status: DC

## 2019-10-15 NOTE — Progress Notes (Signed)
Reason for visit: Memory disturbance, dysarthria  [name] is an 79 y.o. male  History of present illness:  [name] is a 79 year old right-handed white male with a history of significant dysarthria that has been present since 2017.  This occurred after a significant illness with a peptic ulcer and he required intubation and required a tracheotomy.  He has had significant problems with appetite and weight loss as well but was placed on Depakote on his last visit and has gained 13 pounds.  He now is eating quite well.  The caretaker comes in with him today, they indicate that he is having some troubles with drooling and he has had some a chronic issue with intermittent diarrhea.  He has had recent annual blood work done through his primary care physician.  I do not have the results of these studies.  The patient is on Namenda, he seems to tolerate this well.  He has had some problems with anxiety in the evenings, no hallucinations have been noted and no agitation is occurring.  He has had an ongoing progression of his memory.  He is no longer reading the paper or interested in shows on TV as much.  He may nap during the day.  He does have some underlying fatigue.  He has a chronic gait disturbance but he has not had any falls.  Past Medical History:  Diagnosis Date  . 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 Aspiration pneumonia (HCC) 01/17/2016  . Bilateral dry eyes   . Bile peritonitis due to gastric tube dislodgement 12/03/2015  . BPH (benign prostatic hyperplasia)   . Central pterygium of right eye   . Chronic respiratory failure with hypoxia (HCC)   . Coronary artery disease 1997, 2009   bare mental stent right coronart artery, occlusive followed by Dr. Garnette Scheuermann in [place]  . Degenerative disc disease    LOWER BACK  . Dysarthria 04/25/2017  . Dysphonia   . ED (erectile dysfunction)   . Enterocutaneous fistula from jejunal  anastomosis 02/06/2016 02/13/2016  . Gastric outlet obstruction 11/24/2015  . Gastroesophageal reflux disease   . Hyperlipidemia   . Hyperopia of both eyes with astigmatism and presbyopia   . Hypertension   . Ischemic right index finger at tip 12/09/2015  . Nuclear senile cataract    [facility]. [facility] in [place]  . Peptic ulcer   . Pneumatosis of intestines 12/11/2015  . Pneumonia    HISTORY OF IN CHILDHOOD  . Posterior vitreous detachment 10/30/2012  . Pyloric stricture 11/24/2015  . Salzmann's nodular dystrophy of left eye   . SBO (small bowel obstruction) (HCC) 01/20/2016  . Urinary hesitancy   . Wears glasses     Past Surgical History:  Procedure Laterality Date  . CARDIAC CATHETERIZATION    . CHOLECYSTECTOMY    . ESOPHAGOGASTRODUODENOSCOPY N/A 12/01/2015   Procedure: ESOPHAGOGASTRODUODENOSCOPY (EGD) BALLOON DILATION OF DUODENAL STRICTURE;  Surgeon: Karie Soda, MD;  Location: [facility];  Service: General;  Laterality: N/A;  . ESOPHAGOGASTRODUODENOSCOPY N/A 12/14/2015   Procedure: ESOPHAGOGASTRODUODENOSCOPY (EGD);  Surgeon: Vida Rigger, MD;  Location: [facility];  Service: Endoscopy;  Laterality: N/A;  Patient has tracheostomy  . ESOPHAGOGASTRODUODENOSCOPY (EGD) WITH PROPOFOL N/A 07/01/2015   Procedure: ESOPHAGOGASTRODUODENOSCOPY (EGD) WITH PROPOFOL;  Surgeon:  Modena, MD;  [facility]: [facility];  Service: Endoscopy;  Laterality: N/A;  . ESOPHAGOGASTRODUODENOSCOPY (EGD) WITH PROPOFOL N/A 11/23/2015   Procedure: ESOPHAGOGASTRODUODENOSCOPY (EGD) WITH PROPOFOL;  Surgeon: Chrissie Noa  Dulce Sellar, MD;  Location: [facility];  Service: Endoscopy;  Laterality: N/A;  may need to intubate  . EYE SURGERY     growth on right eye removed   . IR GENERIC HISTORICAL  12/13/2015   IR REPLC DUODEN/JEJUNO TUBE PERCUT W/FLUORO 12/13/2015 [name], MD [facility]  . IR GENERIC HISTORICAL  02/03/2016   IR REPLC GASTRO/COLONIC TUBE PERCUT W/FLUORO 02/03/2016 Berdine Dance, MD  [facility]  . IR GENERIC HISTORICAL  02/04/2016   IR GASTR TUBE CONVERT GASTR-JEJ PER W/FL MOD SED 02/04/2016 Malachy Moan, MD [facility]  . IR GENERIC HISTORICAL  02/25/2016   IR SINUS/FIST TUBE CHK-NON GI 02/25/2016 Irish Lack, MD [facility]  . IR GENERIC HISTORICAL  03/18/2016   IR CM INJ ANY COLONIC TUBE W/FLUORO 03/18/2016 Simonne Come, MD [facility]  . IR GENERIC HISTORICAL  03/18/2016   IR GJ TUBE CHANGE 03/18/2016 Simonne Come, MD [facility]  . IR GENERIC HISTORICAL  03/22/2016   IR CM INJ ANY COLONIC TUBE W/FLUORO 03/22/2016 Simonne Come, MD [facility]  . IR GENERIC HISTORICAL  04/13/2016   IR CM INJ ANY COLONIC TUBE W/FLUORO 04/13/2016 Richarda Overlie, MD [facility]  . LAPAROSCOPIC GASTROSTOMY N/A 12/01/2015   Procedure: LAPAROSCOPIC PLACEMENT OF FEEDING JEJUNOSTOMY AND GASTROSTOMY TUBE;  Surgeon: Karie Soda, MD;  [facility];  Service: General;  Laterality: N/A;  . LAPAROSCOPY N/A 12/03/2015   Procedure: LAPAROSCOPY DIAGNOSTIC, OMENTOPEXY, JEJUNOSTOMY, WASH OUT;  Surgeon: Karie Soda, MD;  [facility];  Service: General;  Laterality: N/A;  . LAPAROTOMY N/A 02/06/2016   Procedure: EXPLORATORY LAPAROTOMY, LYSIS OF ADHESIONS, SEGMENTAL SMALL BOWEL RESECTION, GASTROJEJUNOSTOMY;  Surgeon: Chevis Pretty III, MD;  [facility];  Service: General;  Laterality: N/A;  . STENT TO HEART     2 1997 AND 1 IN 1996  . TONSILLECTOMY    . XI ROBOTIC VAGOTOMY AND ANTRECTOMY N/A 09/25/2015   Procedure: XI ROBOTIC ANTERIOR AND POSTERIOR VAGOTOMY, BILROTH I  ANASTOMOSIS DOR FUNDIPLICATION, OMENTOPEXY  UPPER ENDOSCOPY;  Surgeon: Karie Soda, MD;  Location: [facility];  Service: General;  Laterality: N/A;    Family History  Problem Relation Age of Onset  . Heart attack Mother     Social history:  reports that he quit smoking about 24 years ago. His smoking use included cigarettes. He has a 40.00 pack-year smoking history. He has never used smokeless tobacco. He reports that he does not  drink alcohol and does not use drugs.    Allergies  Allergen Reactions  . Flomax [Tamsulosin Hcl] Other (See Comments)    Leg weakness  . Lisinopril Cough  . Lorazepam Other (See Comments)    "i don't like it"  Prefers Xanax or valium  . Uroxatral [Alfuzosin Hcl Er] Other (See Comments)    Leg weakness    Medications:  Prior to Admission medications   Medication Sig Start Date End Date Taking? Authorizing Provider  aspirin 81 MG tablet 81 mg by Per J Tube route daily.    [provider]  divalproex (DEPAKOTE) 125 MG DR tablet TAKE (2) TABLETS TWICE DAILY. 09/16/19   York Spaniel, MD  memantine (NAMENDA) 10 MG tablet TAKE 1 TABLET BY MOUTH TWICE DAILY. 06/10/19   York Spaniel, MD  Multiple Vitamin (MULTI-VITAMINS) TABS 1 tablet every morning.    [provider]  nitroGLYCERIN (NITROSTAT) 0.4 MG SL tablet Place 1 tablet (0.4 mg total) under the tongue every 5 (five) minutes as needed for chest pain. 02/26/16  Jerre Simon, PA  pantoprazole (PROTONIX) 40 MG tablet  10/19/17   [provider]  pravastatin (PRAVACHOL) 40 MG tablet  09/06/17   [provider]    ROS:  Out of a complete 14 system review of symptoms, the patient complains only of the following symptoms, and all other reviewed systems are negative.  Memory problems Drooling Diarrhea  Blood pressure (!) 139/58, pulse (!) 55, height 6' (1.829 m), weight 162 lb (73.5 kg).  Physical Exam  General: The patient is alert and cooperative at the time of the examination.  Skin: No significant peripheral edema is noted.   Neurologic Exam  Mental status: The patient is alert and oriented x 3 at the time of the examination. The Mini-Mental status examination done today shows a total score of 19/30.   Cranial nerves: Facial symmetry is present. Speech is dysarthric, not aphasic. Extraocular movements are full. Visual fields are full.  Motor: The patient has good strength in all 4  extremities.  Sensory examination: Soft touch sensation is symmetric on the face, arms, and legs.  Coordination: The patient has good finger-nose-finger and heel-to-shin bilaterally.  Gait and station: The patient is able to walk independently, he may have a slightly wide-based gait.  Good symmetric arm swing is seen.  Romberg is negative.  Reflexes: Deep tendon reflexes are symmetric.   Assessment/Plan:  1.  Memory disturbance  2.  Mild gait disturbance  3.  Chronic dysarthria, stable  The patient has had stabilization of his weight and has been able to gain 13 pounds since last seen on Depakote.  We will continue the Depakote for now.  He has had recent blood work done through his primary care physician.  The patient will be placed on Lexapro for anxiety issues.  This may also help his appetite.  We will not start Aricept at this time as the caretaker reports that he already has problems with diarrhea and drooling.  The patient will follow up here in 6 months.  Marlan Palau MD 10/15/2019 7:21 AM  [facility] [address] [place], [place] [address]  Phone [phone] Fax [phone]

## 2019-12-09 ENCOUNTER — Other Ambulatory Visit: Payer: Self-pay | Admitting: Neurology

## 2019-12-12 ENCOUNTER — Encounter: Payer: Self-pay | Admitting: Neurology

## 2019-12-12 ENCOUNTER — Institutional Professional Consult (permissible substitution): Payer: Medicare Other | Admitting: Neurology

## 2020-02-20 DIAGNOSIS — M545 Low back pain, unspecified: Secondary | ICD-10-CM | POA: Diagnosis not present

## 2020-02-20 DIAGNOSIS — Z23 Encounter for immunization: Secondary | ICD-10-CM | POA: Diagnosis not present

## 2020-03-06 ENCOUNTER — Institutional Professional Consult (permissible substitution): Payer: Medicare Other | Admitting: Neurology

## 2020-03-12 DIAGNOSIS — I2510 Atherosclerotic heart disease of native coronary artery without angina pectoris: Secondary | ICD-10-CM | POA: Diagnosis not present

## 2020-03-12 DIAGNOSIS — R195 Other fecal abnormalities: Secondary | ICD-10-CM | POA: Diagnosis not present

## 2020-03-12 DIAGNOSIS — Z1389 Encounter for screening for other disorder: Secondary | ICD-10-CM | POA: Diagnosis not present

## 2020-03-12 DIAGNOSIS — N1832 Chronic kidney disease, stage 3b: Secondary | ICD-10-CM | POA: Diagnosis not present

## 2020-03-12 DIAGNOSIS — Z23 Encounter for immunization: Secondary | ICD-10-CM | POA: Diagnosis not present

## 2020-03-12 DIAGNOSIS — K219 Gastro-esophageal reflux disease without esophagitis: Secondary | ICD-10-CM | POA: Diagnosis not present

## 2020-03-12 DIAGNOSIS — E7800 Pure hypercholesterolemia, unspecified: Secondary | ICD-10-CM | POA: Diagnosis not present

## 2020-03-12 DIAGNOSIS — Z0000 Encounter for general adult medical examination without abnormal findings: Secondary | ICD-10-CM | POA: Diagnosis not present

## 2020-04-05 ENCOUNTER — Other Ambulatory Visit: Payer: Self-pay | Admitting: Neurology

## 2020-05-13 ENCOUNTER — Encounter: Payer: Self-pay | Admitting: *Deleted

## 2020-05-14 ENCOUNTER — Ambulatory Visit: Payer: Medicare Other | Admitting: Neurology

## 2020-05-14 ENCOUNTER — Encounter: Payer: Self-pay | Admitting: Neurology

## 2020-05-14 VITALS — Ht 72.0 in | Wt 166.6 lb

## 2020-05-14 DIAGNOSIS — I2510 Atherosclerotic heart disease of native coronary artery without angina pectoris: Secondary | ICD-10-CM | POA: Diagnosis not present

## 2020-05-14 DIAGNOSIS — R413 Other amnesia: Secondary | ICD-10-CM | POA: Diagnosis not present

## 2020-05-14 DIAGNOSIS — I252 Old myocardial infarction: Secondary | ICD-10-CM | POA: Diagnosis not present

## 2020-05-14 DIAGNOSIS — N183 Chronic kidney disease, stage 3 unspecified: Secondary | ICD-10-CM | POA: Diagnosis not present

## 2020-05-14 DIAGNOSIS — E7800 Pure hypercholesterolemia, unspecified: Secondary | ICD-10-CM | POA: Diagnosis not present

## 2020-05-14 DIAGNOSIS — I10 Essential (primary) hypertension: Secondary | ICD-10-CM | POA: Diagnosis not present

## 2020-05-14 DIAGNOSIS — K219 Gastro-esophageal reflux disease without esophagitis: Secondary | ICD-10-CM | POA: Diagnosis not present

## 2020-05-14 MED ORDER — BUSPIRONE HCL 5 MG PO TABS: 5.0000 mg | ORAL_TABLET | Freq: Two times a day (BID) | ORAL | 3 refills | Status: DC

## 2020-05-14 MED ORDER — DONEPEZIL HCL 5 MG PO TABS: 5.0000 mg | ORAL_TABLET | Freq: Every day | ORAL | 1 refills | Status: DC

## 2020-05-14 NOTE — Patient Instructions (Signed)
We will start Aricept 5 mg at night to use with the namenda for the memory. Watch out for weight loss.  We will begin buspar for the anxiety issues.  Begin Aricept (donepezil) at 5 mg at night for one month. If this medication is well-tolerated, please call our office and we will call in a prescription for the 10 mg tablets. Look out for side effects that may include nausea, diarrhea, weight loss, or stomach cramps. This medication will also cause a runny nose, therefore there is no need for allergy medications for this purpose.

## 2020-05-14 NOTE — Progress Notes (Signed)
Reason for visit: Memory disorder, anxiety  Referring physician: Dr. Kirk Ruths [name] is a 80 y.o. male  History of present illness:  [name] is an 80 year old right-handed white male with a history of a memory disturbance, he has been followed for this over the past.  He is on Namenda for the memory, he has had problems in the past with weight loss.  He comes in today with a caretaker who indicates that he has been eating quite well recently, he has gained weight since last seen, he has gained at least 4 pounds over the last 6 months.  The patient lives at home, he requires some assistance with keeping up with medications and appointments, his daughter manages the finances.  The patient is able to bathe and dress himself.  He has very little interest in any activities, he generally will sit around the house all day, the caretakers have to strongly encourage him at times to get out of the house to go to the park or walk.  The patient sleeps well at night but he does have some issues with anxiety that seem to be worse in the evening hours.  He has been on Lexapro, but this resulted in too much drowsiness.  This has been stopped.  The patient remains on Depakote as a mood stabilizer but also this will boost appetite.  He takes 250 mg twice daily.  The patient denies any hallucinations, he does have some drooling at times.  He has mild issues with gait instability, he has not had any recent falls.  He reports no headaches, shortness of breath, or any weakness or numbness of the extremities.  She does have some low back pain.  He returns for further evaluation.  Past Medical History:  Diagnosis Date  . 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 Aspiration pneumonia (HCC) 01/17/2016  . Bilateral dry eyes   . Bile peritonitis due to gastric tube dislodgement 12/03/2015  . BPH (benign prostatic hyperplasia)   . Central pterygium of right eye   .  Chronic respiratory failure with hypoxia (HCC)   . Coronary artery disease 1997, 2009   bare mental stent right coronart artery, occlusive followed by Dr. Garnette Scheuermann in [place]  . Degenerative disc disease    LOWER BACK  . Dementia (HCC)   . Dysarthria 04/25/2017  . Dysphonia   . ED (erectile dysfunction)   . 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 Gastric outlet obstruction 11/24/2015  . Gastroesophageal reflux disease   . Hyperlipidemia   . Hyperopia of both eyes with astigmatism and presbyopia   . Hypertension   . Ischemic right index finger at tip 12/09/2015  . Nuclear senile cataract    [facility]. [facility] in [place]  . Peptic ulcer   . Pneumatosis of intestines 12/11/2015  . Pneumonia    HISTORY OF IN CHILDHOOD  . Posterior vitreous detachment 10/30/2012  . Pyloric stricture 11/24/2015  . Salzmann's nodular dystrophy of left eye   . SBO (small bowel obstruction) (HCC) 01/20/2016  . Urinary hesitancy   . Wears glasses     Past Surgical History:  Procedure Laterality Date  . CARDIAC CATHETERIZATION    . CHOLECYSTECTOMY    . ESOPHAGOGASTRODUODENOSCOPY N/A 12/01/2015   Procedure: ESOPHAGOGASTRODUODENOSCOPY (EGD) BALLOON DILATION OF DUODENAL STRICTURE;  Surgeon: Karie Soda, MD;  [facility]: [facility];  Service: General;  Laterality: N/A;  . ESOPHAGOGASTRODUODENOSCOPY N/A 12/14/2015  Procedure: ESOPHAGOGASTRODUODENOSCOPY (EGD);  Surgeon: Vida Rigger, MD;  Location: [facility];  Service: Endoscopy;  Laterality: N/A;  Patient has tracheostomy  . ESOPHAGOGASTRODUODENOSCOPY (EGD) WITH PROPOFOL N/A 07/01/2015   Procedure: ESOPHAGOGASTRODUODENOSCOPY (EGD) WITH PROPOFOL;  Surgeon: Leeza Heiner Modena, MD;  [facility];  Service: Endoscopy;  Laterality: N/A;  . ESOPHAGOGASTRODUODENOSCOPY (EGD) WITH PROPOFOL N/A 11/23/2015   Procedure: ESOPHAGOGASTRODUODENOSCOPY (EGD) WITH PROPOFOL;  Surgeon: Alysha Doolan Modena, MD;  Location: [facility];   Service: Endoscopy;  Laterality: N/A;  may need to intubate  . EYE SURGERY     growth on right eye removed   . IR GENERIC HISTORICAL  12/13/2015   IR REPLC DUODEN/JEJUNO TUBE PERCUT W/FLUORO 12/13/2015 [name], MD [facility]  . IR GENERIC HISTORICAL  02/03/2016   IR REPLC GASTRO/COLONIC TUBE PERCUT W/FLUORO 02/03/2016 Berdine Dance, MD [facility]  . IR GENERIC HISTORICAL  02/04/2016   IR GASTR TUBE CONVERT GASTR-JEJ PER W/FL MOD SED 02/04/2016 Malachy Moan, MD [facility]  . IR GENERIC HISTORICAL  02/25/2016   IR SINUS/FIST TUBE CHK-NON GI 02/25/2016 Irish Lack, MD [facility]  . IR GENERIC HISTORICAL  03/18/2016   IR CM INJ ANY COLONIC TUBE W/FLUORO 03/18/2016 Simonne Come, MD [facility]  . IR GENERIC HISTORICAL  03/18/2016   IR GJ TUBE CHANGE 03/18/2016 Simonne Come, MD [facility]  . IR GENERIC HISTORICAL  03/22/2016   IR CM INJ ANY COLONIC TUBE W/FLUORO 03/22/2016 Simonne Come, MD [facility]  . IR GENERIC HISTORICAL  04/13/2016   IR CM INJ ANY COLONIC TUBE W/FLUORO 04/13/2016 Richarda Overlie, MD [facility]  . LAPAROSCOPIC GASTROSTOMY N/A 12/01/2015   Procedure: LAPAROSCOPIC PLACEMENT OF FEEDING JEJUNOSTOMY AND GASTROSTOMY TUBE;  Surgeon: Karie Soda, MD;  Location: [facility];  Service: General;  Laterality: N/A;  . LAPAROSCOPY N/A 12/03/2015   Procedure: LAPAROSCOPY DIAGNOSTIC, OMENTOPEXY, JEJUNOSTOMY, WASH OUT;  Surgeon: Karie Soda, MD;  [facility]: [facility];  Service: General;  Laterality: N/A;  . LAPAROTOMY N/A 02/06/2016   Procedure: EXPLORATORY LAPAROTOMY, LYSIS OF ADHESIONS, SEGMENTAL SMALL BOWEL RESECTION, GASTROJEJUNOSTOMY;  Surgeon: Chevis Pretty III, MD;  Location: [facility];  Service: General;  Laterality: N/A;  . STENT TO HEART     2 1997 AND 1 IN 1996  . TONSILLECTOMY    . XI ROBOTIC VAGOTOMY AND ANTRECTOMY N/A 09/25/2015   Procedure: XI ROBOTIC ANTERIOR AND POSTERIOR VAGOTOMY, BILROTH I  ANASTOMOSIS DOR FUNDIPLICATION, OMENTOPEXY  UPPER ENDOSCOPY;  Surgeon: Karie Soda, MD;   Location: [facility];  Service: General;  Laterality: N/A;    Family History  Problem Relation Age of Onset  . Heart attack Mother     Social history:  reports that he quit smoking about 25 years ago. His smoking use included cigarettes. He has a 40.00 pack-year smoking history. He has never used smokeless tobacco. He reports that he does not drink alcohol and does not use drugs.  Medications:  Prior to Admission medications   Medication Sig Start Date End Date Taking? Authorizing Provider  aspirin 81 MG tablet 81 mg by Per J Tube route daily.   Yes [provider]  divalproex (DEPAKOTE) 125 MG DR tablet TAKE (2) TABLETS TWICE DAILY. 04/06/20  Yes York Spaniel, MD  memantine (NAMENDA) 10 MG tablet TAKE 1 TABLET BY MOUTH TWICE DAILY. 12/10/19  Yes York Spaniel, MD  Multiple Vitamin (MULTI-VITAMINS) TABS 1 tablet every morning.   Yes [provider]  nitroGLYCERIN (NITROSTAT) 0.4 MG SL tablet Place 1 tablet (0.4 mg total) under  the tongue every 5 (five) minutes as needed for chest pain. 02/26/16  Yes Focht, Jessica L, PA  pantoprazole (PROTONIX) 40 MG tablet  10/19/17  Yes [provider]  pravastatin (PRAVACHOL) 40 MG tablet  09/06/17  Yes [provider]  escitalopram (LEXAPRO) 5 MG tablet Take 1 tablet (5 mg total) by mouth daily. 10/15/19   York Spaniel, MD      Allergies  Allergen Reactions  . Flomax [Tamsulosin Hcl] Other (See Comments)    Leg weakness  . Lisinopril Cough  . Lorazepam Other (See Comments)    "i don't like it"  Prefers Xanax or valium  . Uroxatral [Alfuzosin Hcl Er] Other (See Comments)    Leg weakness    ROS:  Out of a complete 14 system review of symptoms, the patient complains only of the following symptoms, and all other reviewed systems are negative.  Memory troubles Back pain Fatigue Anxiety  Height 6' (1.829 m), weight 166 lb 9.6 oz (75.6 kg).  Physical Exam  General: The patient is alert and  cooperative at the time of the examination.  Eyes: Pupils are equal, round, and reactive to light. Discs are flat bilaterally.  Neck: The neck is supple, no carotid bruits are noted.  Respiratory: The respiratory examination is clear.  Cardiovascular: The cardiovascular examination reveals an occasionally irregular rhythm, no obvious murmurs or rubs are noted.  Skin: Extremities are with 1+ edema at the ankles bilaterally.  Neurologic Exam  Mental status: The patient is alert and oriented x 3 at the time of the examination. The Mini-Mental status examination done today shows a total score 23/30.  Cranial nerves: Facial symmetry is present. There is good sensation of the face to pinprick and soft touch bilaterally. The strength of the facial muscles and the muscles to head turning and shoulder shrug are normal bilaterally. Speech is dysarthric, not aphasic. Extraocular movements are full. Visual fields are full. The tongue is midline, and the patient has symmetric elevation of the soft palate. No obvious hearing deficits are noted.  Motor: The motor testing reveals 5 over 5 strength of all 4 extremities. Good symmetric motor tone is noted throughout.  Sensory: Sensory testing is intact to pinprick, soft touch and vibration sensation on all 4 extremities. No evidence of extinction is noted.  Coordination: Cerebellar testing reveals good finger-nose-finger and heel-to-shin bilaterally.  Gait and station: Gait is minimally wide-based, slightly stooped, slow and deliberate. Tandem gait is slightly unsteady. Romberg is negative. No drift is seen.  Reflexes: Deep tendon reflexes are symmetric and normal bilaterally. Toes are downgoing bilaterally.   Assessment/Plan:  1.  Memory disturbance  2.  Anxiety  The patient is minimally active, likely he is deconditioned.  He is having some issues with anxiety, he was unable to tolerate the Lexapro.  We will add BuSpar taking 5 mg twice daily and  increase the dose as needed.  He is now eating well and is gaining weight, we may try a low-dose of the Aricept but the caretaker is to watch out for any abdominal symptoms.  He will follow-up here in 6 months.  He will remain on the Depakote for now.  Marlan Palau MD 05/14/2020 8:34 AM  [facility] [address] [place], [place] [address]  Phone [phone] Fax [phone]

## 2020-05-31 ENCOUNTER — Other Ambulatory Visit: Payer: Self-pay | Admitting: Neurology

## 2020-06-10 DIAGNOSIS — K219 Gastro-esophageal reflux disease without esophagitis: Secondary | ICD-10-CM | POA: Diagnosis not present

## 2020-06-10 DIAGNOSIS — N1832 Chronic kidney disease, stage 3b: Secondary | ICD-10-CM | POA: Diagnosis not present

## 2020-06-10 DIAGNOSIS — I252 Old myocardial infarction: Secondary | ICD-10-CM | POA: Diagnosis not present

## 2020-06-10 DIAGNOSIS — I10 Essential (primary) hypertension: Secondary | ICD-10-CM | POA: Diagnosis not present

## 2020-06-10 DIAGNOSIS — E7800 Pure hypercholesterolemia, unspecified: Secondary | ICD-10-CM | POA: Diagnosis not present

## 2020-06-10 DIAGNOSIS — N183 Chronic kidney disease, stage 3 unspecified: Secondary | ICD-10-CM | POA: Diagnosis not present

## 2020-06-10 DIAGNOSIS — I2510 Atherosclerotic heart disease of native coronary artery without angina pectoris: Secondary | ICD-10-CM | POA: Diagnosis not present

## 2020-07-06 ENCOUNTER — Emergency Department (HOSPITAL_COMMUNITY): Payer: Medicare Other

## 2020-07-06 ENCOUNTER — Inpatient Hospital Stay (HOSPITAL_COMMUNITY): Payer: Medicare Other

## 2020-07-06 ENCOUNTER — Encounter (HOSPITAL_COMMUNITY): Payer: Self-pay | Admitting: Emergency Medicine

## 2020-07-06 ENCOUNTER — Other Ambulatory Visit: Payer: Self-pay

## 2020-07-06 ENCOUNTER — Inpatient Hospital Stay (HOSPITAL_COMMUNITY)
Admission: EM | Admit: 2020-07-06 | Discharge: 2020-07-10 | DRG: 522 | Disposition: A | Discharge status: Skilled Nursing Facility | Payer: Medicare Other | Attending: Family Medicine | Admitting: Family Medicine

## 2020-07-06 DIAGNOSIS — S72002D Fracture of unspecified part of neck of left femur, subsequent encounter for closed fracture with routine healing: Secondary | ICD-10-CM | POA: Diagnosis not present

## 2020-07-06 DIAGNOSIS — W010XXA Fall on same level from slipping, tripping and stumbling without subsequent striking against object, initial encounter: Secondary | ICD-10-CM | POA: Diagnosis present

## 2020-07-06 DIAGNOSIS — N179 Acute kidney failure, unspecified: Secondary | ICD-10-CM

## 2020-07-06 DIAGNOSIS — N400 Enlarged prostate without lower urinary tract symptoms: Secondary | ICD-10-CM | POA: Diagnosis present

## 2020-07-06 DIAGNOSIS — Z87891 Personal history of nicotine dependence: Secondary | ICD-10-CM

## 2020-07-06 DIAGNOSIS — M25552 Pain in left hip: Secondary | ICD-10-CM | POA: Diagnosis not present

## 2020-07-06 DIAGNOSIS — S72012A Unspecified intracapsular fracture of left femur, initial encounter for closed fracture: Secondary | ICD-10-CM | POA: Diagnosis not present

## 2020-07-06 DIAGNOSIS — R21 Rash and other nonspecific skin eruption: Secondary | ICD-10-CM | POA: Diagnosis not present

## 2020-07-06 DIAGNOSIS — K219 Gastro-esophageal reflux disease without esophagitis: Secondary | ICD-10-CM | POA: Diagnosis present

## 2020-07-06 DIAGNOSIS — Z8249 Family history of ischemic heart disease and other diseases of the circulatory system: Secondary | ICD-10-CM | POA: Diagnosis not present

## 2020-07-06 DIAGNOSIS — E785 Hyperlipidemia, unspecified: Secondary | ICD-10-CM | POA: Diagnosis not present

## 2020-07-06 DIAGNOSIS — Z66 Do not resuscitate: Secondary | ICD-10-CM | POA: Diagnosis not present

## 2020-07-06 DIAGNOSIS — Z743 Need for continuous supervision: Secondary | ICD-10-CM | POA: Diagnosis not present

## 2020-07-06 DIAGNOSIS — N183 Chronic kidney disease, stage 3 unspecified: Secondary | ICD-10-CM | POA: Diagnosis not present

## 2020-07-06 DIAGNOSIS — F419 Anxiety disorder, unspecified: Secondary | ICD-10-CM | POA: Diagnosis present

## 2020-07-06 DIAGNOSIS — Z01810 Encounter for preprocedural cardiovascular examination: Secondary | ICD-10-CM

## 2020-07-06 DIAGNOSIS — R1312 Dysphagia, oropharyngeal phase: Secondary | ICD-10-CM | POA: Diagnosis not present

## 2020-07-06 DIAGNOSIS — W19XXXA Unspecified fall, initial encounter: Secondary | ICD-10-CM | POA: Diagnosis not present

## 2020-07-06 DIAGNOSIS — I10 Essential (primary) hypertension: Secondary | ICD-10-CM | POA: Diagnosis not present

## 2020-07-06 DIAGNOSIS — D696 Thrombocytopenia, unspecified: Secondary | ICD-10-CM | POA: Diagnosis not present

## 2020-07-06 DIAGNOSIS — F0390 Unspecified dementia without behavioral disturbance: Secondary | ICD-10-CM | POA: Diagnosis present

## 2020-07-06 DIAGNOSIS — Z7982 Long term (current) use of aspirin: Secondary | ICD-10-CM | POA: Diagnosis not present

## 2020-07-06 DIAGNOSIS — R6889 Other general symptoms and signs: Secondary | ICD-10-CM | POA: Diagnosis not present

## 2020-07-06 DIAGNOSIS — H11001 Unspecified pterygium of right eye: Secondary | ICD-10-CM | POA: Diagnosis not present

## 2020-07-06 DIAGNOSIS — E860 Dehydration: Secondary | ICD-10-CM | POA: Diagnosis not present

## 2020-07-06 DIAGNOSIS — Z96642 Presence of left artificial hip joint: Secondary | ICD-10-CM | POA: Diagnosis not present

## 2020-07-06 DIAGNOSIS — S72002A Fracture of unspecified part of neck of left femur, initial encounter for closed fracture: Secondary | ICD-10-CM | POA: Diagnosis present

## 2020-07-06 DIAGNOSIS — Z8781 Personal history of (healed) traumatic fracture: Secondary | ICD-10-CM

## 2020-07-06 DIAGNOSIS — I5030 Unspecified diastolic (congestive) heart failure: Secondary | ICD-10-CM | POA: Diagnosis not present

## 2020-07-06 DIAGNOSIS — D519 Vitamin B12 deficiency anemia, unspecified: Secondary | ICD-10-CM | POA: Diagnosis not present

## 2020-07-06 DIAGNOSIS — Z9181 History of falling: Secondary | ICD-10-CM | POA: Diagnosis not present

## 2020-07-06 DIAGNOSIS — I110 Hypertensive heart disease with heart failure: Secondary | ICD-10-CM | POA: Diagnosis not present

## 2020-07-06 DIAGNOSIS — Z79899 Other long term (current) drug therapy: Secondary | ICD-10-CM

## 2020-07-06 DIAGNOSIS — Z20822 Contact with and (suspected) exposure to covid-19: Secondary | ICD-10-CM | POA: Diagnosis not present

## 2020-07-06 DIAGNOSIS — Z8719 Personal history of other diseases of the digestive system: Secondary | ICD-10-CM

## 2020-07-06 DIAGNOSIS — Z043 Encounter for examination and observation following other accident: Secondary | ICD-10-CM | POA: Diagnosis not present

## 2020-07-06 DIAGNOSIS — R1310 Dysphagia, unspecified: Secondary | ICD-10-CM | POA: Diagnosis not present

## 2020-07-06 DIAGNOSIS — I25119 Atherosclerotic heart disease of native coronary artery with unspecified angina pectoris: Secondary | ICD-10-CM | POA: Diagnosis not present

## 2020-07-06 DIAGNOSIS — Z888 Allergy status to other drugs, medicaments and biological substances status: Secondary | ICD-10-CM

## 2020-07-06 DIAGNOSIS — Y92019 Unspecified place in single-family (private) house as the place of occurrence of the external cause: Secondary | ICD-10-CM | POA: Diagnosis not present

## 2020-07-06 DIAGNOSIS — M6259 Muscle wasting and atrophy, not elsewhere classified, multiple sites: Secondary | ICD-10-CM | POA: Diagnosis not present

## 2020-07-06 DIAGNOSIS — Z9889 Other specified postprocedural states: Secondary | ICD-10-CM

## 2020-07-06 DIAGNOSIS — I5033 Acute on chronic diastolic (congestive) heart failure: Secondary | ICD-10-CM | POA: Diagnosis not present

## 2020-07-06 DIAGNOSIS — R2689 Other abnormalities of gait and mobility: Secondary | ICD-10-CM | POA: Diagnosis not present

## 2020-07-06 DIAGNOSIS — E46 Unspecified protein-calorie malnutrition: Secondary | ICD-10-CM | POA: Diagnosis not present

## 2020-07-06 DIAGNOSIS — D539 Nutritional anemia, unspecified: Secondary | ICD-10-CM | POA: Diagnosis not present

## 2020-07-06 DIAGNOSIS — S72045A Nondisplaced fracture of base of neck of left femur, initial encounter for closed fracture: Secondary | ICD-10-CM | POA: Diagnosis not present

## 2020-07-06 DIAGNOSIS — M6281 Muscle weakness (generalized): Secondary | ICD-10-CM | POA: Diagnosis not present

## 2020-07-06 DIAGNOSIS — I2510 Atherosclerotic heart disease of native coronary artery without angina pectoris: Secondary | ICD-10-CM | POA: Diagnosis not present

## 2020-07-06 DIAGNOSIS — S0990XA Unspecified injury of head, initial encounter: Secondary | ICD-10-CM | POA: Diagnosis not present

## 2020-07-06 DIAGNOSIS — Z471 Aftercare following joint replacement surgery: Secondary | ICD-10-CM | POA: Diagnosis not present

## 2020-07-06 LAB — BASIC METABOLIC PANEL
Anion gap: 5 (ref 5–15)
BUN: 29 mg/dL — ABNORMAL HIGH (ref 8–23)
CO2: 24 mmol/L (ref 22–32)
Calcium: 8.4 mg/dL — ABNORMAL LOW (ref 8.9–10.3)
Chloride: 109 mmol/L (ref 98–111)
Creatinine, Ser: 1.5 mg/dL — ABNORMAL HIGH (ref 0.61–1.24)
GFR, Estimated: 47 mL/min — ABNORMAL LOW (ref >60)
Glucose, Bld: 95 mg/dL (ref 70–99)
Potassium: 4.1 mmol/L (ref 3.5–5.1)
Sodium: 138 mmol/L (ref 135–145)

## 2020-07-06 LAB — CBC WITH DIFFERENTIAL/PLATELET
Abs Immature Granulocytes: 0.06 10*3/uL (ref 0.00–0.07)
Basophils Absolute: 0.1 10*3/uL (ref 0.0–0.1)
Basophils Relative: 1 %
Eosinophils Absolute: 0 10*3/uL (ref 0.0–0.5)
Eosinophils Relative: 0 %
HCT: 38.3 % — ABNORMAL LOW (ref 39.0–52.0)
Hemoglobin: 12.5 g/dL — ABNORMAL LOW (ref 13.0–17.0)
Immature Granulocytes: 1 %
Lymphocytes Relative: 19 %
Lymphs Abs: 1.5 10*3/uL (ref 0.7–4.0)
MCH: 33 pg (ref 26.0–34.0)
MCHC: 32.6 g/dL (ref 30.0–36.0)
MCV: 101.1 fL — ABNORMAL HIGH (ref 80.0–100.0)
Monocytes Absolute: 0.8 10*3/uL (ref 0.1–1.0)
Monocytes Relative: 10 %
Neutro Abs: 5.4 10*3/uL (ref 1.7–7.7)
Neutrophils Relative %: 69 %
Platelets: 168 10*3/uL (ref 150–400)
RBC: 3.79 MIL/uL — ABNORMAL LOW (ref 4.22–5.81)
RDW: 12.2 % (ref 11.5–15.5)
WBC: 7.9 10*3/uL (ref 4.0–10.5)
nRBC: 0 % (ref 0.0–0.2)

## 2020-07-06 LAB — RESP PANEL BY RT-PCR (FLU A&B, COVID) ARPGX2
Influenza A by PCR: NEGATIVE
Influenza B by PCR: NEGATIVE
SARS Coronavirus 2 by RT PCR: NEGATIVE

## 2020-07-06 LAB — SURGICAL PCR SCREEN
MRSA, PCR: POSITIVE — AB
Staphylococcus aureus: POSITIVE — AB

## 2020-07-06 MED ORDER — OXYCODONE HCL 5 MG PO TABS: 2.5000 mg | ORAL_TABLET | ORAL | Status: DC | PRN

## 2020-07-06 MED ORDER — DONEPEZIL HCL 10 MG PO TABS
5.0000 mg | ORAL_TABLET | Freq: Every day | ORAL | Status: DC
  Administered 2020-07-06 – 2020-07-10 (×5): 5 mg via ORAL
  Filled 2020-07-06 (×5): qty 1

## 2020-07-06 MED ORDER — OXYCODONE HCL 5 MG PO TABS
5.0000 mg | ORAL_TABLET | ORAL | Status: DC | PRN
  Administered 2020-07-06 – 2020-07-07 (×3): 5 mg via ORAL
  Filled 2020-07-06 (×3): qty 1

## 2020-07-06 MED ORDER — ASPIRIN EC 81 MG PO TBEC
81.0000 mg | DELAYED_RELEASE_TABLET | Freq: Every day | ORAL | Status: DC
  Administered 2020-07-08 – 2020-07-10 (×3): 81 mg via ORAL
  Filled 2020-07-06 (×3): qty 1

## 2020-07-06 MED ORDER — ACETAMINOPHEN 500 MG PO TABS
1000.0000 mg | ORAL_TABLET | Freq: Three times a day (TID) | ORAL | Status: DC
  Administered 2020-07-06 – 2020-07-07 (×3): 1000 mg via ORAL
  Filled 2020-07-06 (×3): qty 2

## 2020-07-06 MED ORDER — PRAVASTATIN SODIUM 20 MG PO TABS
40.0000 mg | ORAL_TABLET | Freq: Every day | ORAL | Status: DC
  Administered 2020-07-08 – 2020-07-10 (×3): 40 mg via ORAL
  Filled 2020-07-06 (×3): qty 2

## 2020-07-06 MED ORDER — DIVALPROEX SODIUM 125 MG PO DR TAB
125.0000 mg | DELAYED_RELEASE_TABLET | Freq: Two times a day (BID) | ORAL | Status: DC
  Administered 2020-07-06 – 2020-07-10 (×7): 125 mg via ORAL
  Filled 2020-07-06 (×8): qty 1

## 2020-07-06 MED ORDER — MEMANTINE HCL 10 MG PO TABS
10.0000 mg | ORAL_TABLET | Freq: Two times a day (BID) | ORAL | Status: DC
  Administered 2020-07-06 – 2020-07-10 (×7): 10 mg via ORAL
  Filled 2020-07-06 (×7): qty 1

## 2020-07-06 MED ORDER — POLYETHYLENE GLYCOL 3350 17 G PO PACK
17.0000 g | PACK | Freq: Every day | ORAL | Status: DC
  Administered 2020-07-08 – 2020-07-09 (×2): 17 g via ORAL
  Filled 2020-07-06 (×2): qty 1

## 2020-07-06 MED ORDER — MORPHINE SULFATE (PF) 2 MG/ML IV SOLN: 1.0000 mg | INTRAVENOUS | Status: DC | PRN

## 2020-07-06 MED ORDER — LACTATED RINGERS IV SOLN: INTRAVENOUS | Status: AC

## 2020-07-06 MED ORDER — PANTOPRAZOLE SODIUM 40 MG PO TBEC
40.0000 mg | DELAYED_RELEASE_TABLET | Freq: Every day | ORAL | Status: DC
  Administered 2020-07-08 – 2020-07-10 (×3): 40 mg via ORAL
  Filled 2020-07-06 (×3): qty 1

## 2020-07-06 MED ORDER — MUPIROCIN 2 % EX OINT
1.0000 "application " | TOPICAL_OINTMENT | Freq: Two times a day (BID) | CUTANEOUS | Status: DC
  Administered 2020-07-06 – 2020-07-10 (×8): 1 via NASAL
  Filled 2020-07-06: qty 22

## 2020-07-06 MED ORDER — ACETAMINOPHEN 325 MG PO TABS: 650.0000 mg | ORAL_TABLET | Freq: Four times a day (QID) | ORAL | Status: DC | PRN

## 2020-07-06 MED ORDER — BUSPIRONE HCL 5 MG PO TABS
5.0000 mg | ORAL_TABLET | Freq: Two times a day (BID) | ORAL | Status: DC
  Administered 2020-07-06 – 2020-07-10 (×7): 5 mg via ORAL
  Filled 2020-07-06 (×7): qty 1

## 2020-07-06 MED ORDER — ENSURE PRE-SURGERY PO LIQD
296.0000 mL | Freq: Once | ORAL | Status: DC
  Filled 2020-07-06: qty 296

## 2020-07-06 NOTE — ED Provider Notes (Addendum)
[facility]-EMERGENCY DEPT Provider Note   CSN: [id number] Arrival date & time: 07/06/20  1220     History Chief Complaint  Patient presents with  . Hip Pain    [name] is a 80 y.o. male.  Patient with mechanical fall at home.  Pain to the left hip.  Unable to ambulate since.  Not on blood thinners.  History of dementia.  The history is provided by the patient.  Hip Pain This is a new problem. The current episode started less than 1 hour ago. The problem occurs constantly. The problem has not changed since onset.Pertinent negatives include no chest pain, no abdominal pain, no headaches and no shortness of breath. Nothing aggravates the symptoms. Nothing relieves the symptoms. He has tried nothing for the symptoms. The treatment provided no relief.       Past Medical History:  Diagnosis Date  . 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 Aspiration pneumonia (HCC) 01/17/2016  . Bilateral dry eyes   . Bile peritonitis due to gastric tube dislodgement 12/03/2015  . BPH (benign prostatic hyperplasia)   . Central pterygium of right eye   . Chronic respiratory failure with hypoxia (HCC)   . Coronary artery disease 1997, 2009   bare mental stent right coronart artery, occlusive followed by Dr. Garnette Scheuermann in [place]  . Degenerative disc disease    LOWER BACK  . Dementia (HCC)   . Dysarthria 04/25/2017  . Dysphonia   . ED (erectile dysfunction)   . 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 Gastric outlet obstruction 11/24/2015  . Gastroesophageal reflux disease   . Hyperlipidemia   . Hyperopia of both eyes with astigmatism and presbyopia   . Hypertension   . Ischemic right index finger at tip 12/09/2015  . Nuclear senile cataract    Dr. [facility] in [place]  . Peptic ulcer   . Pneumatosis of intestines 12/11/2015  . Pneumonia    HISTORY OF IN CHILDHOOD   . Posterior vitreous detachment 10/30/2012  . Pyloric stricture 11/24/2015  . Salzmann's nodular dystrophy of left eye   . SBO (small bowel obstruction) (HCC) 01/20/2016  . Urinary hesitancy   . Wears glasses     Patient Active Problem List   Diagnosis Date Noted  . Memory disorder 11/08/2018  . Dysarthria 04/25/2017  . Delayed gastric emptying 04/22/2016  . Respiratory failure (HCC) 04/16/2016  . SOB (shortness of breath)   . NSVT (nonsustained ventricular tachycardia) (HCC)   . Elevated troponin 04/15/2016  . Acute on chronic diastolic heart failure (HCC)   . Gastric dysmotility   . Gastrojejunostomy tube 02/06/2016 03/10/2016  . Hyponatremia   . Anxiety and depression   . Sleep disturbance   . Orthostasis   . Leukocytosis   . Hyperglycemia   . FTT (failure to thrive) in adult   . Anemia of chronic disease 01/31/2016  . Stage 2 skin ulcer of sacral region (HCC) 01/31/2016  . Hypomagnesemia 01/21/2016  . Hypokalemia 01/21/2016  . Pressure injury of skin 01/20/2016  . Pleural effusion   . Gastroesophageal reflux disease 01/19/2016  . Insomnia 01/19/2016  . Skin ulcer of abdominal wall, limited to breakdown of skin (HCC) 01/17/2016  . Dysphagia 01/17/2016  . Debility 01/17/2016  . 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 Vitamin B12 deficiency 01/02/2016  . BPH (benign prostatic hyperplasia)   . Posterior vitreous detachment   . Salzmann's nodular dystrophy of  left eye   . Hyperopia of both eyes with astigmatism and presbyopia   . Central pterygium of right eye   . Pressure ulcer of buttock, right, unstageable (HCC) 12/30/2015  . Hypernatremia 12/28/2015  . Tobacco abuse 12/09/2015  . Gastritis 12/09/2015  . Respiratory failure, acute (HCC)   . Protein-calorie malnutrition, severe 12/04/2015  . Encephalopathy acute 12/03/2015  . Anxiety state 09/28/2015  . Hyperlipidemia   . 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 09/25/2015 09/25/2015   . Coronary atherosclerosis of native coronary artery 04/24/2013  . Essential hypertension, benign 04/24/2013  . Other and unspecified hyperlipidemia 04/24/2013  . Central pterygium 10/30/2012  . 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 Cataract, nuclear sclerotic senile 10/30/2012    Past Surgical History:  Procedure Laterality Date  . CARDIAC CATHETERIZATION    . CHOLECYSTECTOMY    . ESOPHAGOGASTRODUODENOSCOPY N/A 12/01/2015   Procedure: ESOPHAGOGASTRODUODENOSCOPY (EGD) BALLOON DILATION OF DUODENAL STRICTURE;  Surgeon: Karie Soda, MD;  Location: [facility];  Service: General;  Laterality: N/A;  . ESOPHAGOGASTRODUODENOSCOPY N/A 12/14/2015   Procedure: ESOPHAGOGASTRODUODENOSCOPY (EGD);  Surgeon: Vida Rigger, MD;  Location: [facility];  Service: Endoscopy;  Laterality: N/A;  Patient has tracheostomy  . ESOPHAGOGASTRODUODENOSCOPY (EGD) WITH PROPOFOL N/A 07/01/2015   Procedure: ESOPHAGOGASTRODUODENOSCOPY (EGD) WITH PROPOFOL;  Surgeon: Willis Modena, MD;  Location: [facility];  Service: Endoscopy;  Laterality: N/A;  . ESOPHAGOGASTRODUODENOSCOPY (EGD) WITH PROPOFOL N/A 11/23/2015   Procedure: ESOPHAGOGASTRODUODENOSCOPY (EGD) WITH PROPOFOL;  Surgeon: Willis Modena, MD;  Location[facility];  Service: Endoscopy;  Laterality: N/A;  may need to intubate  . EYE SURGERY     growth on right eye removed   . IR GENERIC HISTORICAL  12/13/2015   IR REPLC DUODEN/JEJUNO TUBE PERCUT W/FLUORO 12/13/2015 [name], MD [facility]  . IR GENERIC HISTORICAL  02/03/2016   IR REPLC GASTRO/COLONIC TUBE PERCUT W/FLUORO 02/03/2016 Berdine Dance, MD [facility]  . IR GENERIC HISTORICAL  02/04/2016   IR GASTR TUBE CONVERT GASTR-JEJ PER W/FL MOD SED 02/04/2016 Malachy Moan, MD [facility]  . IR GENERIC HISTORICAL  02/25/2016   IR SINUS/FIST TUBE CHK-NON GI 02/25/2016 Irish Lack, MD [facility]  . IR GENERIC HISTORICAL  03/18/2016   IR CM INJ ANY COLONIC TUBE W/FLUORO  03/18/2016 Simonne Come, MD [facility]  . IR GENERIC HISTORICAL  03/18/2016   IR GJ TUBE CHANGE 03/18/2016 Simonne Come, MD [facility]  . IR GENERIC HISTORICAL  03/22/2016   IR CM INJ ANY COLONIC TUBE W/FLUORO 03/22/2016 Simonne Come, MD [facility]  . IR GENERIC HISTORICAL  04/13/2016   IR CM INJ ANY COLONIC TUBE W/FLUORO 04/13/2016 Richarda Overlie, MD [facility]  . LAPAROSCOPIC GASTROSTOMY N/A 12/01/2015   Procedure: LAPAROSCOPIC PLACEMENT OF FEEDING JEJUNOSTOMY AND GASTROSTOMY TUBE;  Surgeon: Karie Soda, MD;  Location: [facility];  Service: General;  Laterality: N/A;  . LAPAROSCOPY N/A 12/03/2015   Procedure: LAPAROSCOPY DIAGNOSTIC, OMENTOPEXY, JEJUNOSTOMY, WASH OUT;  Surgeon: Karie Soda, MD;  Location: [facility];  Service: General;  Laterality: N/A;  . LAPAROTOMY N/A 02/06/2016   Procedure: EXPLORATORY LAPAROTOMY, LYSIS OF ADHESIONS, SEGMENTAL SMALL BOWEL RESECTION, GASTROJEJUNOSTOMY;  Surgeon: Chevis Pretty III, MD;  Location: [facility];  Service: General;  Laterality: N/A;  . STENT TO HEART     2 1997 AND 1 IN 1996  . TONSILLECTOMY    . XI ROBOTIC VAGOTOMY AND ANTRECTOMY N/A 09/25/2015   Procedure: XI ROBOTIC ANTERIOR AND POSTERIOR VAGOTOMY, BILROTH I  ANASTOMOSIS DOR FUNDIPLICATION,  OMENTOPEXY  UPPER ENDOSCOPY;  Surgeon: Karie Soda, MD;  Location: [facility];  Service: General;  Laterality: N/A;       Family History  Problem Relation Age of Onset  . Heart attack Mother     Social History   Tobacco Use  . Smoking status: Former Smoker    Packs/day: 2.00    Years: 20.00    Pack years: 40.00    Types: Cigarettes    Quit date: 02/15/1995    Years since quitting: 25.4  . Smokeless tobacco: Never Used  Substance Use Topics  . Alcohol use: No  . Drug use: No    Home Medications Prior to Admission medications   Medication Sig Start Date End Date Taking? Authorizing Provider  aspirin 81 MG tablet 81 mg by Per J Tube route daily.    [provider]  busPIRone (BUSPAR) 5 MG tablet Take 1  tablet (5 mg total) by mouth 2 (two) times daily. 05/14/20   York Spaniel, MD  divalproex (DEPAKOTE) 125 MG DR tablet TAKE (2) TABLETS TWICE DAILY. 04/06/20   York Spaniel, MD  donepezil (ARICEPT) 5 MG tablet Take 1 tablet (5 mg total) by mouth at bedtime. 05/14/20   York Spaniel, MD  memantine (NAMENDA) 10 MG tablet TAKE 1 TABLET BY MOUTH TWICE DAILY. 06/01/20   York Spaniel, MD  Multiple Vitamin (MULTI-VITAMINS) TABS 1 tablet every morning.    [provider]  nitroGLYCERIN (NITROSTAT) 0.4 MG SL tablet Place 1 tablet (0.4 mg total) under the tongue every 5 (five) minutes as needed for chest pain. 02/26/16   Jerre Simon, PA  pantoprazole (PROTONIX) 40 MG tablet  10/19/17   [provider]  pravastatin (PRAVACHOL) 40 MG tablet  09/06/17   [provider]    Allergies    Flomax [tamsulosin hcl], Lisinopril, Lorazepam, and Uroxatral [alfuzosin hcl er]  Review of Systems   Review of Systems  Constitutional: Negative for chills and fever.  HENT: Negative for ear pain and sore throat.   Eyes: Negative for pain and visual disturbance.  Respiratory: Negative for cough and shortness of breath.   Cardiovascular: Negative for chest pain and palpitations.  Gastrointestinal: Negative for abdominal pain and vomiting.  Genitourinary: Negative for dysuria and hematuria.  Musculoskeletal: Positive for arthralgias and gait problem. Negative for back pain.  Skin: Negative for color change and rash.  Neurological: Negative for seizures, syncope and headaches.  All other systems reviewed and are negative.   Physical Exam Updated Vital Signs  ED Triage Vitals  Enc Vitals Group     BP 07/06/20 1239 (!) 196/55     Pulse Rate 07/06/20 1239 65     Resp 07/06/20 1239 19     Temp 07/06/20 1239 98.8 F (37.1 C)     Temp Source 07/06/20 1239 Oral     SpO2 07/06/20 1239 97 %     Weight 07/06/20 1237 169 lb 12.1 oz (77 kg)     Height 07/06/20 1237 6' (1.829 m)      Head Circumference --      Peak Flow --      Pain Score 07/06/20 1237 8     Pain Loc --      Pain Edu? --      Excl. in GC? --     Physical Exam Vitals and nursing note reviewed.  Constitutional:      General: He is not in acute distress.    Appearance:  He is well-developed. He is not ill-appearing.  HENT:     Head: Normocephalic and atraumatic.     Nose: Nose normal.     Mouth/Throat:     Mouth: Mucous membranes are moist.  Eyes:     Conjunctiva/sclera: Conjunctivae normal.     Pupils: Pupils are equal, round, and reactive to light.  Cardiovascular:     Rate and Rhythm: Normal rate and regular rhythm.     Pulses: Normal pulses.     Heart sounds: Normal heart sounds. No murmur heard.   Pulmonary:     Effort: Pulmonary effort is normal. No respiratory distress.     Breath sounds: Normal breath sounds.  Abdominal:     Palpations: Abdomen is soft.     Tenderness: There is no abdominal tenderness.  Musculoskeletal:        General: Tenderness present. Normal range of motion.     Cervical back: Normal range of motion and neck supple.     Comments: Tenderness to the left hip with pain with range of motion of the left hip, no midline spinal pain  Skin:    General: Skin is warm and dry.     Capillary Refill: Capillary refill takes less than 2 seconds.  Neurological:     General: No focal deficit present.     Mental Status: He is alert.     Sensory: No sensory deficit.     Motor: No weakness.  Psychiatric:        Mood and Affect: Mood normal.     ED Results / Procedures / Treatments   Labs (all labs ordered are listed, but only abnormal results are displayed) Labs Reviewed  CBC WITH DIFFERENTIAL/PLATELET - Abnormal; Notable for the following components:      Result Value   RBC 3.79 (*)    Hemoglobin 12.5 (*)    HCT 38.3 (*)    MCV 101.1 (*)    All other components within normal limits  BASIC METABOLIC PANEL - Abnormal; Notable for the following components:   BUN  29 (*)    Creatinine, Ser 1.50 (*)    Calcium 8.4 (*)    GFR, Estimated 47 (*)    All other components within normal limits  RESP PANEL BY RT-PCR (FLU A&B, COVID) [id number]    EKG EKG Interpretation  Date/Time:  Monday Jul 06 2020 16:22:09 EDT Ventricular Rate:  52 PR Interval:  183 QRS Duration: 91 QT Interval:  520 QTC Calculation: 484 R Axis:   85 Text Interpretation: Sinus rhythm Borderline right axis deviation Borderline prolonged QT interval Confirmed by Virgina Norfolk ([id number]) on 07/06/2020 4:26:00 PM   Radiology CT Head Wo Contrast  Result Date: 07/06/2020 CLINICAL DATA:  Fall EXAM: CT HEAD WITHOUT CONTRAST TECHNIQUE: Contiguous axial images were obtained from the base of the skull through the vertex without intravenous contrast. COMPARISON:  None. FINDINGS: Brain: There is no acute intracranial hemorrhage, mass effect, or edema. Gray-white differentiation is preserved. There is no extra-axial fluid collection. Prominence of the ventricles and sulci reflects generalized parenchymal volume loss. Patchy hypoattenuation in the supratentorial white matter is nonspecific but probably reflects moderate chronic microvascular ischemic changes. Vascular: There is atherosclerotic calcification at the skull base. Skull: Calvarium is unremarkable. Sinuses/Orbits: No acute finding. Other: None. IMPRESSION: No evidence of acute intracranial injury. Electronically Signed   By: Guadlupe Spanish M.D.   On: 07/06/2020 15:47   CT Cervical Spine Wo Contrast  Result Date: 07/06/2020 CLINICAL DATA:  Fall EXAM:  CT CERVICAL SPINE WITHOUT CONTRAST TECHNIQUE: Multidetector CT imaging of the cervical spine was performed without intravenous contrast. Multiplanar CT image reconstructions were also generated. COMPARISON:  None. FINDINGS: Alignment: No significant anteroposterior listhesis. Skull base and vertebrae: No acute cervical spine fracture. Vertebral body heights are maintained. Soft tissues and spinal canal:  No prevertebral fluid or swelling. No visible canal hematoma. Disc levels: Multilevel degenerative changes are present including disc space narrowing, endplate osteophytes, and facet and uncovertebral hypertrophy. Upper chest: No apical lung mass. Other: None. IMPRESSION: No acute cervical spine fracture. Electronically Signed   By: Guadlupe SpanishPraneil  [name] M.D.   On: 07/06/2020 15:50   DG Hip Unilat With Pelvis 2-3 Views Left  Result Date: 07/06/2020 CLINICAL DATA:  Left hip and femur pain after a fall today. Initial encounter. EXAM: DG HIP (WITH OR WITHOUT PELVIS) 2-3V LEFT COMPARISON:  None. FINDINGS: The patient has an acute subcapital fracture of the left hip. Nondisplaced component of the fracture extends more inferiorly into the femoral neck. The fracture is minimally impacted. No other acute abnormality is seen. IMPRESSION: Acute left femoral neck fracture. Electronically Signed   By: Drusilla Kannerhomas  Dalessio M.D.   On: 07/06/2020 15:46   DG FEMUR MIN 2 VIEWS LEFT  Result Date: 07/06/2020 CLINICAL DATA:  Left hip and femur pain after a fall today. Initial encounter. EXAM: LEFT FEMUR 2 VIEWS COMPARISON:  None. FINDINGS: Left hip fracture is identified joint abnormality. No other acute bony or joint abnormality is seen. Soft tissues are negative. IMPRESSION: Acute left hip fracture. Please see report of dedicated plain films of the left hip today. Electronically Signed   By: Drusilla Kannerhomas  Dalessio M.D.   On: 07/06/2020 15:45    Procedures Procedures   Medications Ordered in ED Medications - No data to display  ED Course  I have reviewed the triage vital signs and the nursing notes.  Pertinent labs & imaging results that were available during my care of the patient were reviewed by me and considered in my medical decision making (see chart for details).    MDM Rules/Calculators/A&P                          Ardyth GalRalph T Shifflet is an 80 year old male with history of high cholesterol, hypertension, reflux, dementia  who presents the ED with hip pain after fall.  Pain on the left side.  X-ray showed fracture.  Head and neck CT are normal.  Medical screening labs are overall unremarkable.  No significant anemia, electrolyte abnormality.  COVID test is negative.  Family updated.  Case discussed with orthopedics, Dr. Yevette Edwardsumonski and patient to be admitted to medicine for further care.  This chart was dictated using voice recognition software.  Despite best efforts to proofread,  errors can occur which can change the documentation meaning.    Final Clinical Impression(s) / ED Diagnoses Final diagnoses:  Closed fracture of left hip, initial encounter [facility](HCC)    Rx / DC Orders ED Discharge Orders    None       Virgina NorfolkCuratolo, Lagina Reader, DO 07/06/20 1559    Virgina Norfolkuratolo, Dhaval Woo, DO 07/06/20 1626

## 2020-07-06 NOTE — ED Triage Notes (Signed)
BIB EMS from home, C/C unwitnessed fall on carpet, might have been picking up a paper towel, denies LOC or hitting head, complains of L hip pain w/ movement. Pedal pulses 2+ bilaterally, good capillary refill. Alert and oriented x3, disoriented to time. Caregiver was present.

## 2020-07-06 NOTE — Plan of Care (Signed)
  Problem: Activity: Goal: Risk for activity intolerance will decrease Outcome: Progressing   Problem: Nutrition: Goal: Adequate nutrition will be maintained Outcome: Progressing   Problem: Pain Managment: Goal: General experience of comfort will improve Outcome: Progressing   Problem: Safety: Goal: Ability to remain free from injury will improve Outcome: Progressing   

## 2020-07-06 NOTE — H&P (Signed)
History and Physical    [name] ZOX:[id number] DOB: Nov 28, 1940 DOA: 07/06/2020  PCP: Kirby FunkGriffin, John, MD  Patient coming from: home  I have personally briefly reviewed patient's old medical records in [facility]  Chief Complaint: fall  HPI: [name] is [name] 80 y.o. male with medical history significant of CAD, dementia, hx SBO s/p g tube (now removed), hypertension, and multiple other medical problems presenting after [name] fall.  History is limited due to the patient's dementia.  Fall was unwitness.  His caregiver notes at baseline he showers/bathes independently.  Dresses independently and toilets independently.  Walks without using cane, though there's one at home.  She notes he was found walking from bathroom, down.  Thinks he may have fallen to hands/knees?  The fall was unwitness, but no noted loss of trauma.  No head trauma known.  He denies CP, SOB, abdominal pain, N/V, fevers, chills, weakness.  No smoking or etoh.  ED Course: L femoral neck fracture.  Ortho c/s, admit to medicine.  Review of Systems: As per HPI otherwise all other systems reviewed and are negative.  Past Medical History:  Diagnosis Date  . 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 Aspiration pneumonia (HCC) 01/17/2016  . Bilateral dry eyes   . Bile peritonitis due to gastric tube dislodgement 12/03/2015  . BPH (benign prostatic hyperplasia)   . Central pterygium of right eye   . Chronic respiratory failure with hypoxia (HCC)   . Coronary artery disease 1997, 2009   bare mental stent right coronart artery, occlusive followed by Dr. Garnette ScheuermannHank Smith in [place]  . Degenerative disc disease    LOWER BACK  . Dementia (HCC)   . Dysarthria 04/25/2017  . Dysphonia   . ED (erectile dysfunction)   . 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 Gastric outlet obstruction 11/24/2015  . Gastroesophageal reflux disease   .  Hyperlipidemia   . Hyperopia of both eyes with astigmatism and presbyopia   . Hypertension   . Ischemic right index finger at tip 12/09/2015  . Nuclear senile cataract    Dr. Bernadette HoitHayden [facility] in [place]  . Peptic ulcer   . Pneumatosis of intestines 12/11/2015  . Pneumonia    HISTORY OF IN CHILDHOOD  . Posterior vitreous detachment 10/30/2012  . Pyloric stricture 11/24/2015  . Salzmann's nodular dystrophy of left eye   . SBO (small bowel obstruction) (HCC) 01/20/2016  . Urinary hesitancy   . Wears glasses     Past Surgical History:  Procedure Laterality Date  . CARDIAC CATHETERIZATION    . CHOLECYSTECTOMY    . ESOPHAGOGASTRODUODENOSCOPY N/Nasim Habeeb 12/01/2015   Procedure: ESOPHAGOGASTRODUODENOSCOPY (EGD) BALLOON DILATION OF DUODENAL STRICTURE;  Surgeon: Karie SodaSteven Gross, MD;  Location: [facility];  Service: General;  Laterality: N/Tishanna Dunford;  . ESOPHAGOGASTRODUODENOSCOPY N/Macky Galik 12/14/2015   Procedure: ESOPHAGOGASTRODUODENOSCOPY (EGD);  Surgeon: Vida RiggerMarc Magod, MD;  [facility];  Service: Endoscopy;  Laterality: N/Isiaha Greenup;  Patient has tracheostomy  . ESOPHAGOGASTRODUODENOSCOPY (EGD) WITH PROPOFOL N/Akilah Cureton 07/01/2015   Procedure: ESOPHAGOGASTRODUODENOSCOPY (EGD) WITH PROPOFOL;  Surgeon: Willis ModenaWilliam Outlaw, MD;  Location: [facility];  Service: Endoscopy;  Laterality: N/Mitchell Iwanicki;  . ESOPHAGOGASTRODUODENOSCOPY (EGD) WITH PROPOFOL N/Malissia Rabbani 11/23/2015   Procedure: ESOPHAGOGASTRODUODENOSCOPY (EGD) WITH PROPOFOL;  Surgeon: Willis ModenaWilliam Outlaw, MD;  [facility];  Service: Endoscopy;  Laterality: N/Alexis Mizuno;  may need to intubate  . EYE SURGERY     growth on right eye removed   . IR GENERIC HISTORICAL  12/13/2015  IR REPLC DUODEN/JEJUNO TUBE PERCUT W/FLUORO 12/13/2015 [name], MD [facility]  . IR GENERIC HISTORICAL  02/03/2016   IR REPLC GASTRO/COLONIC TUBE PERCUT W/FLUORO 02/03/2016 Berdine Dance, MD [facility]  . IR GENERIC HISTORICAL  02/04/2016   IR GASTR TUBE CONVERT GASTR-JEJ PER W/FL MOD SED 02/04/2016 Malachy Moan, MD [facility]  . IR GENERIC HISTORICAL  02/25/2016   IR SINUS/FIST TUBE CHK-NON GI 02/25/2016 Irish Lack, MD [facility]  . IR GENERIC HISTORICAL  03/18/2016   IR CM INJ ANY COLONIC TUBE W/FLUORO 03/18/2016 Simonne Come, MD [facility]  . IR GENERIC HISTORICAL  03/18/2016   IR GJ TUBE CHANGE 03/18/2016 Simonne Come, MD [facility]  . IR GENERIC HISTORICAL  03/22/2016   IR CM INJ ANY COLONIC TUBE W/FLUORO 03/22/2016 Simonne Come, MD [facility]  . IR GENERIC HISTORICAL  04/13/2016   IR CM INJ ANY COLONIC TUBE W/FLUORO 04/13/2016 Richarda Overlie, MD [facility]  . LAPAROSCOPIC GASTROSTOMY N/Sachin Ferencz 12/01/2015   Procedure: LAPAROSCOPIC PLACEMENT OF FEEDING JEJUNOSTOMY AND GASTROSTOMY TUBE;  Surgeon: Karie Soda, MD;  Location: [facility];  Service: General;  Laterality: N/Gia Lusher;  . LAPAROSCOPY N/Chaim Gatley 12/03/2015   Procedure: LAPAROSCOPY DIAGNOSTIC, OMENTOPEXY, JEJUNOSTOMY, WASH OUT;  Surgeon: Karie Soda, MD;  Location: [facility];  Service: General;  Laterality: N/Fumi Guadron;  . LAPAROTOMY N/Maahir Horst 02/06/2016   Procedure: EXPLORATORY LAPAROTOMY, LYSIS OF ADHESIONS, SEGMENTAL SMALL BOWEL RESECTION, GASTROJEJUNOSTOMY;  Surgeon: Chevis Pretty III, MD;  Location: [facility];  Service: General;  Laterality: N/Autum Benfer;  . STENT TO HEART     2 1997 AND 1 IN 1996  . TONSILLECTOMY    . XI ROBOTIC VAGOTOMY AND ANTRECTOMY N/Senai Ramnath 09/25/2015   Procedure: XI ROBOTIC ANTERIOR AND POSTERIOR VAGOTOMY, BILROTH I  ANASTOMOSIS DOR FUNDIPLICATION, OMENTOPEXY  UPPER ENDOSCOPY;  Surgeon: Karie Soda, MD;  Location: [facility];  Service: General;  Laterality: N/Pryce Folts;    Social History  reports that he quit smoking about 25 years ago. His smoking use included cigarettes. He has Kasyn Rolph 40.00 pack-year smoking history. He has never used smokeless tobacco. He reports that he does not drink alcohol and does not use drugs.  Allergies  Allergen Reactions  . Flomax [Tamsulosin Hcl] Other (See Comments)    Leg weakness  . Lisinopril Cough  . Lorazepam Other (See Comments)    "i  don't like it"  Prefers Xanax or valium  . Uroxatral [Alfuzosin Hcl Er] Other (See Comments)    Leg weakness    Family History  Problem Relation Age of Onset  . Heart attack Mother    Prior to Admission medications   Medication Sig Start Date End Date Taking? Authorizing Provider  aspirin 81 MG tablet 81 mg by Per J Tube route daily.    [provider]  busPIRone (BUSPAR) 5 MG tablet Take 1 tablet (5 mg total) by mouth 2 (two) times daily. 05/14/20   York Spaniel, MD  divalproex (DEPAKOTE) 125 MG DR tablet TAKE (2) TABLETS TWICE DAILY. 04/06/20   York Spaniel, MD  donepezil (ARICEPT) 5 MG tablet Take 1 tablet (5 mg total) by mouth at bedtime. 05/14/20   York Spaniel, MD  memantine (NAMENDA) 10 MG tablet TAKE 1 TABLET BY MOUTH TWICE DAILY. 06/01/20   York Spaniel, MD  Multiple Vitamin (MULTI-VITAMINS) TABS 1 tablet every morning.    [provider]  nitroGLYCERIN (NITROSTAT) 0.4 MG SL tablet Place 1 tablet (0.4 mg total) under the tongue every 5 (five) minutes as needed for  chest pain. 02/26/16   Jerre Simon, PA  pantoprazole (PROTONIX) 40 MG tablet  10/19/17   [provider]  pravastatin (PRAVACHOL) 40 MG tablet  09/06/17   [provider]    Physical Exam: Vitals:   07/06/20 1240 07/06/20 1525 07/06/20 1630 07/06/20 1645  BP: (!) 186/58 (!) 172/54 (!) 173/54   Pulse:  (!) 51 (!) 51 (!) 48  Resp:  18 (!) 22 18  Temp:      TempSrc:      SpO2:  98% 97% 97%  Weight:      Height:        Constitutional: NAD, calm, comfortable Vitals:   07/06/20 1240 07/06/20 1525 07/06/20 1630 07/06/20 1645  BP: (!) 186/58 (!) 172/54 (!) 173/54   Pulse:  (!) 51 (!) 51 (!) 48  Resp:  18 (!) 22 18  Temp:      TempSrc:      SpO2:  98% 97% 97%  Weight:      Height:       Eyes: PERRL, lids and conjunctivae normal ENMT: Mucous membranes are moist. Posterior pharynx clear of any exudate or lesions.Normal dentition.  Neck: normal, supple, no  masses, no thyromegaly Respiratory: clear to auscultation bilaterally, no wheezing, no crackles. Cardiovascular: Regular rate and rhythm, no murmurs / rubs / gallops. No extremity edema. Abdomen: no tenderness, no masses palpated. No hepatosplenomegaly. Bowel sounds positive.  Musculoskeletal: LLE shortened and externally rotated Skin: no rashes, lesions, ulcers. No induration Neurologic: CN 2-12 grossly intact. Sensation intact.  Psychiatric: Normal judgment and insight. Alert, but disoriented. Normal mood.   Labs on Admission: I have personally reviewed following labs and imaging studies  CBC: Recent Labs  Lab 07/06/20 1406  WBC 7.9  NEUTROABS 5.4  HGB 12.5*  HCT 38.3*  MCV 101.1*  PLT 168    Basic Metabolic Panel: Recent Labs  Lab 07/06/20 1406  NA 138  K 4.1  CL 109  CO2 24  GLUCOSE 95  BUN 29*  CREATININE 1.50*  CALCIUM 8.4*    GFR: Estimated Creatinine Clearance: 42.8 mL/min (Keiri Solano) (by C-G formula based on SCr of 1.5 mg/dL (H)).  Liver Function Tests: No results for input(s): AST, ALT, ALKPHOS, BILITOT, PROT, ALBUMIN in the last 168 hours.  Urine analysis:    Component Value Date/Time   COLORURINE YELLOW 02/28/2016 1036   APPEARANCEUR CLEAR 02/28/2016 1036   LABSPEC 1.018 02/28/2016 1036   PHURINE 5.0 02/28/2016 1036   GLUCOSEU NEGATIVE 02/28/2016 1036   HGBUR NEGATIVE 02/28/2016 1036   BILIRUBINUR NEGATIVE 02/28/2016 1036   KETONESUR NEGATIVE 02/28/2016 1036   PROTEINUR NEGATIVE 02/28/2016 1036   NITRITE NEGATIVE 02/28/2016 1036   LEUKOCYTESUR NEGATIVE 02/28/2016 1036    Radiological Exams on Admission: CT Head Wo Contrast  Result Date: 07/06/2020 CLINICAL DATA:  Fall EXAM: CT HEAD WITHOUT CONTRAST TECHNIQUE: Contiguous axial images were obtained from the base of the skull through the vertex without intravenous contrast. COMPARISON:  None. FINDINGS: Brain: There is no acute intracranial hemorrhage, mass effect, or edema. Gray-white differentiation is  preserved. There is no extra-axial fluid collection. Prominence of the ventricles and sulci reflects generalized parenchymal volume loss. Patchy hypoattenuation in the supratentorial white matter is nonspecific but probably reflects moderate chronic microvascular ischemic changes. Vascular: There is atherosclerotic calcification at the skull base. Skull: Calvarium is unremarkable. Sinuses/Orbits: No acute finding. Other: None. IMPRESSION: No evidence of acute intracranial injury. Electronically Signed   By: Guadlupe Spanish M.D.   On:  07/06/2020 15:47   CT Cervical Spine Wo Contrast  Result Date: 07/06/2020 CLINICAL DATA:  Fall EXAM: CT CERVICAL SPINE WITHOUT CONTRAST TECHNIQUE: Multidetector CT imaging of the cervical spine was performed without intravenous contrast. Multiplanar CT image reconstructions were also generated. COMPARISON:  None. FINDINGS: Alignment: No significant anteroposterior listhesis. Skull base and vertebrae: No acute cervical spine fracture. Vertebral body heights are maintained. Soft tissues and spinal canal: No prevertebral fluid or swelling. No visible canal hematoma. Disc levels: Multilevel degenerative changes are present including disc space narrowing, endplate osteophytes, and facet and uncovertebral hypertrophy. Upper chest: No apical lung mass. Other: None. IMPRESSION: No acute cervical spine fracture. Electronically Signed   By: Guadlupe Spanish M.D.   On: 07/06/2020 15:50   DG Hip Unilat With Pelvis 2-3 Views Left  Result Date: 07/06/2020 CLINICAL DATA:  Left hip and femur pain after Oval Cavazos fall today. Initial encounter. EXAM: DG HIP (WITH OR WITHOUT PELVIS) 2-3V LEFT COMPARISON:  None. FINDINGS: The patient has an acute subcapital fracture of the left hip. Nondisplaced component of the fracture extends more inferiorly into the femoral neck. The fracture is minimally impacted. No other acute abnormality is seen. IMPRESSION: Acute left femoral neck fracture. Electronically Signed    By: Drusilla Kanner M.D.   On: 07/06/2020 15:46   DG FEMUR MIN 2 VIEWS LEFT  Result Date: 07/06/2020 CLINICAL DATA:  Left hip and femur pain after Marley Charlot fall today. Initial encounter. EXAM: LEFT FEMUR 2 VIEWS COMPARISON:  None. FINDINGS: Left hip fracture is identified joint abnormality. No other acute bony or joint abnormality is seen. Soft tissues are negative. IMPRESSION: Acute left hip fracture. Please see report of dedicated plain films of the left hip today. Electronically Signed   By: Drusilla Kanner M.D.   On: 07/06/2020 15:45    EKG: Independently reviewed. Sinus rhythm  Assessment/Plan Active Problems:   Closed left hip fracture, initial encounter (HCC)  Acute L femoral neck fracture 2/2 mechanical fall Plain films as noted above Follow CXR for preop purposes RCRI at least 1 (? Stent in the past, years ago - CAD).  Daughter understands surgical risks.  He was ambulatory before surgery, independent with bathing, showering, dressing, toileting.  Follow CXR, likely will not need any additional preop w/u after that.  Scheduled APAP, oxy, morphine prn. Bowel regimen. Post op DVT ppx per orthopedics Post op therapy, expect he'll need SNF.   Will need PCP follow up for management of osteoporosis outpatient  Acute Kidney Injury Mild, follow with IVF  Dementia  Anxiety  Continue aricept, namenda buspar for anxiety Depakote mood stabilization Delirium precautions Please allow daughter to stay overnight given dementia, risk of delirium   CAD Distant hx stent Continue aspirin, pravastatin  No cp  GERD PPI  Hx SBO s/p g tube g tube has been d/c'd  DVT prophylaxis: SCD  Code Status:   DNR - confirmed on discussion with daughter  Family Communication:  Daughter over phone Disposition Plan:   Patient is from:  home  Anticipated DC to:  SNF  Anticipated DC date:  3-4 days  Anticipated DC barriers: Pending surgery and improvement  Consults called:  Orthopedics by ED  (discussed with Dumonski) Admission status:  inpatient  Severity of Illness: The appropriate patient status for this patient is INPATIENT. Inpatient status is judged to be reasonable and necessary in order to provide the required intensity of service to ensure the patient's safety. The patient's presenting symptoms, physical exam findings, and initial radiographic and  laboratory data in the context of their chronic comorbidities is felt to place them at high risk for further clinical deterioration. Furthermore, it is not anticipated that the patient will be medically stable for discharge from the hospital within 2 midnights of admission. The following factors support the patient status of inpatient.   " The patient's presenting symptoms include L hip pain. " The worrisome physical exam findings include TTP to L hip. " The initial radiographic and laboratory data are worrisome because of L hip fracture. " The chronic co-morbidities include dementia.   * I certify that at the point of admission it is my clinical judgment that the patient will require inpatient hospital care spanning beyond 2 midnights from the point of admission due to high intensity of service, high risk for further deterioration and high frequency of surveillance required.Lacretia Nicks MD [facility]  How to contact the [facility] Attending or Consulting provider 7A - 7P or covering provider during after hours 7P -7A, for this patient?   1. Check the care team in [facility] and look for Jamiria Langill) attending/consulting [facility] provider listed and b) the [facility] team listed 2. Log into [email or web address] and use [place]'s universal password to access. If you do not have the password, please contact the hospital operator. 3. Locate the [facility] provider you are looking for under [facility] and page to Jazzma Neidhardt number that you can be directly reached. 4. If you still have difficulty reaching the provider, please page the [facility] (Director on Call) for  the Hospitalists listed on amion for assistance.  07/06/2020, 5:12 PM

## 2020-07-06 NOTE — ED Notes (Signed)
Patient to xray.

## 2020-07-06 NOTE — Progress Notes (Signed)
Patient admitted to room. Alert and oriented x3. Skin dry and warm to touch. No pain or discomfort. Respiration even and non labor.  Oriented to room and staffs. Will continue to monitor. Daughter is with patient.

## 2020-07-06 NOTE — ED Notes (Signed)
Report given to Tu, RN on 3W.

## 2020-07-07 ENCOUNTER — Inpatient Hospital Stay (HOSPITAL_COMMUNITY): Payer: Medicare Other | Admitting: Anesthesiology

## 2020-07-07 ENCOUNTER — Encounter (HOSPITAL_COMMUNITY)
Admission: EM | Disposition: A | Discharge status: Skilled Nursing Facility | Payer: Self-pay | Source: Home / Self Care | Attending: Family Medicine

## 2020-07-07 ENCOUNTER — Inpatient Hospital Stay (HOSPITAL_COMMUNITY): Payer: Medicare Other

## 2020-07-07 ENCOUNTER — Encounter (HOSPITAL_COMMUNITY): Payer: Self-pay | Admitting: Family Medicine

## 2020-07-07 DIAGNOSIS — S72002A Fracture of unspecified part of neck of left femur, initial encounter for closed fracture: Secondary | ICD-10-CM | POA: Diagnosis not present

## 2020-07-07 HISTORY — PX: HIP ARTHROPLASTY: SHX981

## 2020-07-07 LAB — COMPREHENSIVE METABOLIC PANEL
ALT: 13 U/L (ref 0–44)
AST: 16 U/L (ref 15–41)
Albumin: 2.8 g/dL — ABNORMAL LOW (ref 3.5–5.0)
Alkaline Phosphatase: 75 U/L (ref 38–126)
Anion gap: 5 (ref 5–15)
BUN: 27 mg/dL — ABNORMAL HIGH (ref 8–23)
CO2: 27 mmol/L (ref 22–32)
Calcium: 8.4 mg/dL — ABNORMAL LOW (ref 8.9–10.3)
Chloride: 108 mmol/L (ref 98–111)
Creatinine, Ser: 1.48 mg/dL — ABNORMAL HIGH (ref 0.61–1.24)
GFR, Estimated: 48 mL/min — ABNORMAL LOW (ref >60)
Glucose, Bld: 101 mg/dL — ABNORMAL HIGH (ref 70–99)
Potassium: 4.4 mmol/L (ref 3.5–5.1)
Sodium: 140 mmol/L (ref 135–145)
Total Bilirubin: 0.3 mg/dL (ref 0.3–1.2)
Total Protein: 5.8 g/dL — ABNORMAL LOW (ref 6.5–8.1)

## 2020-07-07 LAB — CBC
HCT: 34.4 % — ABNORMAL LOW (ref 39.0–52.0)
Hemoglobin: 11.1 g/dL — ABNORMAL LOW (ref 13.0–17.0)
MCH: 32.5 pg (ref 26.0–34.0)
MCHC: 32.3 g/dL (ref 30.0–36.0)
MCV: 100.6 fL — ABNORMAL HIGH (ref 80.0–100.0)
Platelets: 148 10*3/uL — ABNORMAL LOW (ref 150–400)
RBC: 3.42 MIL/uL — ABNORMAL LOW (ref 4.22–5.81)
RDW: 12.2 % (ref 11.5–15.5)
WBC: 7.1 10*3/uL (ref 4.0–10.5)
nRBC: 0 % (ref 0.0–0.2)

## 2020-07-07 LAB — PROTIME-INR
INR: 1.1 (ref 0.8–1.2)
Prothrombin Time: 14.5 seconds (ref 11.4–15.2)

## 2020-07-07 LAB — APTT: aPTT: 31 seconds (ref 24–36)

## 2020-07-07 SURGERY — HEMIARTHROPLASTY, HIP, DIRECT ANTERIOR APPROACH, FOR FRACTURE
Anesthesia: Spinal | Site: Hip | Laterality: Left

## 2020-07-07 MED ORDER — PROPOFOL 1000 MG/100ML IV EMUL
INTRAVENOUS | Status: AC
  Filled 2020-07-07: qty 100

## 2020-07-07 MED ORDER — ENSURE PRE-SURGERY PO LIQD
296.0000 mL | Freq: Once | ORAL | Status: AC
  Administered 2020-07-07: 296 mL via ORAL
  Filled 2020-07-07: qty 296

## 2020-07-07 MED ORDER — TRAMADOL HCL 50 MG PO TABS
50.0000 mg | ORAL_TABLET | Freq: Four times a day (QID) | ORAL | Status: DC | PRN
Start: 2020-07-07 — End: 2020-07-10
  Administered 2020-07-08 – 2020-07-10 (×4): 50 mg via ORAL
  Filled 2020-07-07 (×5): qty 1

## 2020-07-07 MED ORDER — CEFAZOLIN SODIUM-DEXTROSE 2-4 GM/100ML-% IV SOLN
2.0000 g | INTRAVENOUS | Status: AC
  Administered 2020-07-07: 2 g via INTRAVENOUS
  Filled 2020-07-07: qty 100

## 2020-07-07 MED ORDER — DOCUSATE SODIUM 100 MG PO CAPS
100.0000 mg | ORAL_CAPSULE | Freq: Two times a day (BID) | ORAL | Status: DC
  Administered 2020-07-07 – 2020-07-10 (×6): 100 mg via ORAL
  Filled 2020-07-07 (×6): qty 1

## 2020-07-07 MED ORDER — ONDANSETRON HCL 4 MG PO TABS: 4.0000 mg | ORAL_TABLET | Freq: Four times a day (QID) | ORAL | Status: DC | PRN

## 2020-07-07 MED ORDER — ROCURONIUM BROMIDE 100 MG/10ML IV SOLN
INTRAVENOUS | Status: DC | PRN
  Administered 2020-07-07: 70 mg via INTRAVENOUS
  Administered 2020-07-07: 20 mg via INTRAVENOUS

## 2020-07-07 MED ORDER — LIDOCAINE HCL (CARDIAC) PF 100 MG/5ML IV SOSY
PREFILLED_SYRINGE | INTRAVENOUS | Status: DC | PRN
  Administered 2020-07-07: 60 mg via INTRATRACHEAL
  Administered 2020-07-07: 20 mg via INTRATRACHEAL

## 2020-07-07 MED ORDER — FENTANYL CITRATE (PF) 100 MCG/2ML IJ SOLN
INTRAMUSCULAR | Status: DC | PRN
  Administered 2020-07-07: 50 ug via INTRAVENOUS

## 2020-07-07 MED ORDER — SODIUM CHLORIDE 0.9 % IR SOLN
Status: DC | PRN
  Administered 2020-07-07: 3000 mL

## 2020-07-07 MED ORDER — PROPOFOL 500 MG/50ML IV EMUL
INTRAVENOUS | Status: DC | PRN
  Administered 2020-07-07: 125 ug/kg/min via INTRAVENOUS

## 2020-07-07 MED ORDER — MENTHOL 3 MG MT LOZG: 1.0000 | LOZENGE | OROMUCOSAL | Status: DC | PRN

## 2020-07-07 MED ORDER — POLYETHYLENE GLYCOL 3350 17 G PO PACK: 17.0000 g | PACK | Freq: Every day | ORAL | Status: DC | PRN

## 2020-07-07 MED ORDER — CHLORHEXIDINE GLUCONATE CLOTH 2 % EX PADS
6.0000 | MEDICATED_PAD | Freq: Every day | CUTANEOUS | Status: DC
  Administered 2020-07-07: 6 via TOPICAL

## 2020-07-07 MED ORDER — PHENYLEPHRINE 40 MCG/ML (10ML) SYRINGE FOR IV PUSH (FOR BLOOD PRESSURE SUPPORT)
PREFILLED_SYRINGE | INTRAVENOUS | Status: DC | PRN
  Administered 2020-07-07 (×3): 160 ug via INTRAVENOUS
  Administered 2020-07-07: 80 ug via INTRAVENOUS

## 2020-07-07 MED ORDER — ACETAMINOPHEN 500 MG PO TABS
500.0000 mg | ORAL_TABLET | Freq: Four times a day (QID) | ORAL | Status: AC
  Administered 2020-07-07 – 2020-07-08 (×4): 500 mg via ORAL
  Filled 2020-07-07 (×4): qty 1

## 2020-07-07 MED ORDER — TRANEXAMIC ACID-NACL 1000-0.7 MG/100ML-% IV SOLN
1000.0000 mg | INTRAVENOUS | Status: AC
  Administered 2020-07-07: 1000 mg via INTRAVENOUS
  Filled 2020-07-07: qty 100

## 2020-07-07 MED ORDER — ALUM & MAG HYDROXIDE-SIMETH 200-200-20 MG/5ML PO SUSP: 30.0000 mL | ORAL | Status: DC | PRN

## 2020-07-07 MED ORDER — CHLORHEXIDINE GLUCONATE 4 % EX LIQD
60.0000 mL | Freq: Once | CUTANEOUS | Status: AC
  Administered 2020-07-07: 4 via TOPICAL
  Filled 2020-07-07: qty 60

## 2020-07-07 MED ORDER — SUGAMMADEX SODIUM 200 MG/2ML IV SOLN
INTRAVENOUS | Status: DC | PRN
  Administered 2020-07-07: 200 mg via INTRAVENOUS

## 2020-07-07 MED ORDER — LACTATED RINGERS IV SOLN: INTRAVENOUS | Status: DC

## 2020-07-07 MED ORDER — ONDANSETRON HCL 4 MG/2ML IJ SOLN: 4.0000 mg | Freq: Four times a day (QID) | INTRAMUSCULAR | Status: DC | PRN

## 2020-07-07 MED ORDER — ONDANSETRON HCL 4 MG/2ML IJ SOLN
INTRAMUSCULAR | Status: DC | PRN
  Administered 2020-07-07: 4 mg via INTRAVENOUS

## 2020-07-07 MED ORDER — PHENYLEPHRINE HCL-NACL 10-0.9 MG/250ML-% IV SOLN
INTRAVENOUS | Status: DC | PRN
  Administered 2020-07-07: 20 ug/min via INTRAVENOUS

## 2020-07-07 MED ORDER — FENTANYL CITRATE (PF) 100 MCG/2ML IJ SOLN: 25.0000 ug | INTRAMUSCULAR | Status: DC | PRN

## 2020-07-07 MED ORDER — 0.9 % SODIUM CHLORIDE (POUR BTL) OPTIME
TOPICAL | Status: DC | PRN
  Administered 2020-07-07: 1000 mL

## 2020-07-07 MED ORDER — VANCOMYCIN HCL 1000 MG IV SOLR
INTRAVENOUS | Status: AC
  Filled 2020-07-07: qty 1000

## 2020-07-07 MED ORDER — ACETAMINOPHEN 325 MG PO TABS
325.0000 mg | ORAL_TABLET | Freq: Four times a day (QID) | ORAL | Status: DC | PRN
  Administered 2020-07-08 – 2020-07-10 (×3): 650 mg via ORAL
  Filled 2020-07-07 (×3): qty 2

## 2020-07-07 MED ORDER — POTASSIUM CHLORIDE IN NACL 20-0.45 MEQ/L-% IV SOLN
INTRAVENOUS | Status: DC
  Filled 2020-07-07 (×2): qty 1000

## 2020-07-07 MED ORDER — FENTANYL CITRATE (PF) 100 MCG/2ML IJ SOLN
INTRAMUSCULAR | Status: AC
  Filled 2020-07-07: qty 2

## 2020-07-07 MED ORDER — TRANEXAMIC ACID-NACL 1000-0.7 MG/100ML-% IV SOLN
1000.0000 mg | Freq: Once | INTRAVENOUS | Status: AC
  Administered 2020-07-07: 1000 mg via INTRAVENOUS
  Filled 2020-07-07: qty 100

## 2020-07-07 MED ORDER — CEFAZOLIN SODIUM-DEXTROSE 2-4 GM/100ML-% IV SOLN
2.0000 g | Freq: Four times a day (QID) | INTRAVENOUS | Status: AC
  Administered 2020-07-07 – 2020-07-08 (×2): 2 g via INTRAVENOUS
  Filled 2020-07-07 (×2): qty 100

## 2020-07-07 MED ORDER — POVIDONE-IODINE 10 % EX SWAB
2.0000 "application " | Freq: Once | CUTANEOUS | Status: AC
  Administered 2020-07-07: 2 via TOPICAL

## 2020-07-07 MED ORDER — EPHEDRINE SULFATE-NACL 50-0.9 MG/10ML-% IV SOSY
PREFILLED_SYRINGE | INTRAVENOUS | Status: DC | PRN
  Administered 2020-07-07: 10 mg via INTRAVENOUS
  Administered 2020-07-07: 5 mg via INTRAVENOUS
  Administered 2020-07-07: 10 mg via INTRAVENOUS

## 2020-07-07 MED ORDER — PHENOL 1.4 % MT LIQD
1.0000 | OROMUCOSAL | Status: DC | PRN
  Administered 2020-07-08 (×2): 1 via OROMUCOSAL
  Filled 2020-07-07: qty 177

## 2020-07-07 MED ORDER — ENOXAPARIN SODIUM 40 MG/0.4ML IJ SOSY
40.0000 mg | PREFILLED_SYRINGE | INTRAMUSCULAR | Status: DC
  Administered 2020-07-08 – 2020-07-10 (×3): 40 mg via SUBCUTANEOUS
  Filled 2020-07-07 (×3): qty 0.4

## 2020-07-07 MED ORDER — FERROUS SULFATE 325 (65 FE) MG PO TABS
325.0000 mg | ORAL_TABLET | Freq: Three times a day (TID) | ORAL | Status: DC
  Administered 2020-07-07 – 2020-07-10 (×7): 325 mg via ORAL
  Filled 2020-07-07 (×7): qty 1

## 2020-07-07 MED ORDER — MAGNESIUM CITRATE PO SOLN: 1.0000 | Freq: Once | ORAL | Status: DC | PRN

## 2020-07-07 MED ORDER — SENNA 8.6 MG PO TABS
1.0000 | ORAL_TABLET | Freq: Two times a day (BID) | ORAL | Status: DC
  Administered 2020-07-07 – 2020-07-10 (×6): 8.6 mg via ORAL
  Filled 2020-07-07 (×6): qty 1

## 2020-07-07 MED ORDER — PROPOFOL 10 MG/ML IV BOLUS
INTRAVENOUS | Status: DC | PRN
  Administered 2020-07-07 (×4): 20 mg via INTRAVENOUS
  Administered 2020-07-07: 50 mg via INTRAVENOUS

## 2020-07-07 MED ORDER — BISACODYL 10 MG RE SUPP: 10.0000 mg | Freq: Every day | RECTAL | Status: DC | PRN

## 2020-07-07 SURGICAL SUPPLY — 54 items
BIT DRILL 2.0X128 (BIT) ×2 IMPLANT
BLADE SAW SAG 73X25 THK (BLADE) ×1
BLADE SAW SGTL 73X25 THK (BLADE) ×1 IMPLANT
BRUSH FEMORAL CANAL (MISCELLANEOUS) ×1 IMPLANT
CEMENT BONE DEPUY (Cement) ×2 IMPLANT
CEMENT RESTRICTOR DEPUY SZ 3 (Cement) ×1 IMPLANT
COVER SURGICAL LIGHT HANDLE (MISCELLANEOUS) ×2 IMPLANT
COVER WAND RF STERILE (DRAPES) IMPLANT
DRAPE INCISE IOBAN 66X45 STRL (DRAPES) ×4 IMPLANT
DRAPE ORTHO SPLIT 77X108 STRL (DRAPES) ×4
DRAPE SURG ORHT 6 SPLT 77X108 (DRAPES) ×2 IMPLANT
DRAPE U-SHAPE 47X51 STRL (DRAPES) ×2 IMPLANT
DRSG MEPILEX BORDER 4X8 (GAUZE/BANDAGES/DRESSINGS) ×2 IMPLANT
DURAPREP 26ML APPLICATOR (WOUND CARE) ×2 IMPLANT
ELECT REM PT RETURN 15FT ADLT (MISCELLANEOUS) ×2 IMPLANT
GLOVE SRG 8 PF TXTR STRL LF DI (GLOVE) ×1 IMPLANT
GLOVE SURG ENC MOIS LTX SZ7 (GLOVE) ×2 IMPLANT
GLOVE SURG ORTHO LTX SZ7.5 (GLOVE) ×2 IMPLANT
GLOVE SURG UNDER POLY LF SZ7 (GLOVE) ×2 IMPLANT
GLOVE SURG UNDER POLY LF SZ8 (GLOVE) ×2
GOWN STRL REUS W/TWL LRG LVL3 (GOWN DISPOSABLE) ×4 IMPLANT
HANDPIECE INTERPULSE COAX TIP (DISPOSABLE) ×2
HEAD FEM UNIPOLAR 56 OD STRL (Hips) ×1 IMPLANT
IMMOBILIZER KNEE 20 (SOFTGOODS) ×2
IMMOBILIZER KNEE 20 THIGH 36 (SOFTGOODS) IMPLANT
KIT BASIN OR (CUSTOM PROCEDURE TRAY) ×2 IMPLANT
KIT TURNOVER KIT A (KITS) ×2 IMPLANT
NS IRRIG 1000ML POUR BTL (IV SOLUTION) ×2 IMPLANT
PACK TOTAL JOINT (CUSTOM PROCEDURE TRAY) ×2 IMPLANT
PENCIL SMOKE EVACUATOR (MISCELLANEOUS) IMPLANT
PRESSURIZER FEMORAL UNIV (MISCELLANEOUS) ×1 IMPLANT
PROTECTOR NERVE ULNAR (MISCELLANEOUS) ×2 IMPLANT
RETRIEVER SUT HEWSON (MISCELLANEOUS) ×2 IMPLANT
SET HNDPC FAN SPRY TIP SCT (DISPOSABLE) IMPLANT
SPACER DEPUY (Hips) ×1 IMPLANT
SPONGE LAP 4X18 RFD (DISPOSABLE) IMPLANT
STEM DIST FEM CENTRALIZR 11 (Hips) ×1 IMPLANT
STEM SUMMIT CEMENTED BASIC SZ6 (Joint) IMPLANT
STRIP CLOSURE SKIN 1/4X4 (GAUZE/BANDAGES/DRESSINGS) ×4 IMPLANT
SUMMIT CEMENTED BASIC SZ 6 (Joint) ×2 IMPLANT
SUT ETHIBOND NAB CT1 #1 30IN (SUTURE) ×2 IMPLANT
SUT FIBERWIRE #2 38 REV NDL BL (SUTURE) ×6
SUT MNCRL AB 4-0 PS2 18 (SUTURE) IMPLANT
SUT VIC AB 0 CT1 36 (SUTURE) ×2 IMPLANT
SUT VIC AB 1 CT1 27 (SUTURE)
SUT VIC AB 1 CT1 27XBRD ANTBC (SUTURE) IMPLANT
SUT VIC AB 2-0 CT1 27 (SUTURE) ×4
SUT VIC AB 2-0 CT1 TAPERPNT 27 (SUTURE) ×2 IMPLANT
SUT VIC AB 3-0 SH 8-18 (SUTURE) ×2 IMPLANT
SUTURE FIBERWR#2 38 REV NDL BL (SUTURE) ×3 IMPLANT
TOWEL OR 17X26 10 PK STRL BLUE (TOWEL DISPOSABLE) ×6 IMPLANT
TOWER CARTRIDGE SMART MIX (DISPOSABLE) IMPLANT
TRAY FOLEY MTR SLVR 16FR STAT (SET/KITS/TRAYS/PACK) IMPLANT
WATER STERILE IRR 1000ML POUR (IV SOLUTION) ×2 IMPLANT

## 2020-07-07 NOTE — Anesthesia Procedure Notes (Signed)
Procedure Name: Intubation Performed by: Sudie Grumbling, CRNA Pre-anesthesia Checklist: Patient identified, Emergency Drugs available, Suction available and Patient being monitored Patient Re-evaluated:Patient Re-evaluated prior to induction Oxygen Delivery Method: Circle system utilized Preoxygenation: Pre-oxygenation with 100% oxygen Induction Type: IV induction Ventilation: Mask ventilation without difficulty Laryngoscope Size: Glidescope Grade View: Grade I Tube type: Oral Tube size: 6.5 mm Number of attempts: 1 Airway Equipment and Method: Stylet Placement Confirmation: ETT inserted through vocal cords under direct vision,  positive ETCO2 and breath sounds checked- equal and bilateral Secured at: 22 cm Tube secured with: Tape Dental Injury: Teeth and Oropharynx as per pre-operative assessment

## 2020-07-07 NOTE — Consult Note (Signed)
ORTHOPAEDIC CONSULTATION  Chief Complaint: Left hip pain  HPI: [name] is a 80 y.o. male  With history of dementia, CAD, GERD, hx of SBO s/p g tube, HTN who complains of left hip pain. History per daughter and patient. States he fell at home yesterday while with caregiver, noticed immediate left hip pain. Today he has no left hip pain at rest, pain severe with any movement. Denies pain at any other joints. Denies chest pain, shortness of breath, abdominal pain. He is not on a blood thinner.   Past Medical History:  Diagnosis Date  . 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 Aspiration pneumonia (HCC) 01/17/2016  . Bilateral dry eyes   . Bile peritonitis due to gastric tube dislodgement 12/03/2015  . BPH (benign prostatic hyperplasia)   . Central pterygium of right eye   . Chronic respiratory failure with hypoxia (HCC)   . Coronary artery disease 1997, 2009   bare mental stent right coronart artery, occlusive followed by Dr. Garnette Scheuermann in [place]  . Degenerative disc disease    LOWER BACK  . Dementia (HCC)   . Dysarthria 04/25/2017  . Dysphonia   . ED (erectile dysfunction)   . 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 Gastric outlet obstruction 11/24/2015  . Gastroesophageal reflux disease   . Hyperlipidemia   . Hyperopia of both eyes with astigmatism and presbyopia   . Hypertension   . Ischemic right index finger at tip 12/09/2015  . Nuclear senile cataract    Dr. [facility] in [place]  . Peptic ulcer   . Pneumatosis of intestines 12/11/2015  . Pneumonia    HISTORY OF IN CHILDHOOD  . Posterior vitreous detachment 10/30/2012  . Pyloric stricture 11/24/2015  . Salzmann's nodular dystrophy of left eye   . SBO (small bowel obstruction) (HCC) 01/20/2016  . Urinary hesitancy   . Wears glasses    Past Surgical History:  Procedure Laterality Date  . CARDIAC CATHETERIZATION     . CHOLECYSTECTOMY    . ESOPHAGOGASTRODUODENOSCOPY N/A 12/01/2015   Procedure: ESOPHAGOGASTRODUODENOSCOPY (EGD) BALLOON DILATION OF DUODENAL STRICTURE;  Surgeon: Karie Soda, MD;  Location: [facility];  Service: General;  Laterality: N/A;  . ESOPHAGOGASTRODUODENOSCOPY N/A 12/14/2015   Procedure: ESOPHAGOGASTRODUODENOSCOPY (EGD);  Surgeon: Vida Rigger, MD;  Location: [facility];  Service: Endoscopy;  Laterality: N/A;  Patient has tracheostomy  . ESOPHAGOGASTRODUODENOSCOPY (EGD) WITH PROPOFOL N/A 07/01/2015   Procedure: ESOPHAGOGASTRODUODENOSCOPY (EGD) WITH PROPOFOL;  Surgeon: Willis Modena, MD;  Location: [facility];  Service: Endoscopy;  Laterality: N/A;  . ESOPHAGOGASTRODUODENOSCOPY (EGD) WITH PROPOFOL N/A 11/23/2015   Procedure: ESOPHAGOGASTRODUODENOSCOPY (EGD) WITH PROPOFOL;  Surgeon: Willis Modena, MD;  Location: [facility];  Service: Endoscopy;  Laterality: N/A;  may need to intubate  . EYE SURGERY     growth on right eye removed   . IR GENERIC HISTORICAL  12/13/2015   IR REPLC DUODEN/JEJUNO TUBE PERCUT W/FLUORO 12/13/2015 [name], MD [facility]  . IR GENERIC HISTORICAL  02/03/2016   IR REPLC GASTRO/COLONIC TUBE PERCUT W/FLUORO 02/03/2016 Berdine Dance, MD [facility]  . IR GENERIC HISTORICAL  02/04/2016   IR GASTR TUBE CONVERT GASTR-JEJ PER W/FL MOD SED 02/04/2016 Malachy Moan, MD [facility]  . IR GENERIC HISTORICAL  02/25/2016   IR SINUS/FIST TUBE CHK-NON GI 02/25/2016 Irish Lack, MD [facility]  . IR GENERIC HISTORICAL  03/18/2016   IR CM INJ ANY COLONIC TUBE W/FLUORO 03/18/2016 Simonne Come, MD [facility]  .  IR GENERIC HISTORICAL  03/18/2016   IR GJ TUBE CHANGE 03/18/2016 Simonne Come, MD [facility]  . IR GENERIC HISTORICAL  03/22/2016   IR CM INJ ANY COLONIC TUBE W/FLUORO 03/22/2016 Simonne Come, MD [facility]  . IR GENERIC HISTORICAL  04/13/2016   IR CM INJ ANY COLONIC TUBE W/FLUORO 04/13/2016 Richarda Overlie, MD [facility]  . LAPAROSCOPIC GASTROSTOMY N/A 12/01/2015    Procedure: LAPAROSCOPIC PLACEMENT OF FEEDING JEJUNOSTOMY AND GASTROSTOMY TUBE;  Surgeon: Karie Soda, MD;  Location: [facility];  Service: General;  Laterality: N/A;  . LAPAROSCOPY N/A 12/03/2015   Procedure: LAPAROSCOPY DIAGNOSTIC, OMENTOPEXY, JEJUNOSTOMY, WASH OUT;  Surgeon: Karie Soda, MD;  Location: [facility];  Service: General;  Laterality: N/A;  . LAPAROTOMY N/A 02/06/2016   Procedure: EXPLORATORY LAPAROTOMY, LYSIS OF ADHESIONS, SEGMENTAL SMALL BOWEL RESECTION, GASTROJEJUNOSTOMY;  Surgeon: Chevis Pretty III, MD;  Location: [facility];  Service: General;  Laterality: N/A;  . STENT TO HEART     2 1997 AND 1 IN 1996  . TONSILLECTOMY    . XI ROBOTIC VAGOTOMY AND ANTRECTOMY N/A 09/25/2015   Procedure: XI ROBOTIC ANTERIOR AND POSTERIOR VAGOTOMY, BILROTH I  ANASTOMOSIS DOR FUNDIPLICATION, OMENTOPEXY  UPPER ENDOSCOPY;  Surgeon: Karie Soda, MD;  Location: [facility];  Service: General;  Laterality: N/A;   Social History   Socioeconomic History  . Marital status: Widowed    Spouse name: Not on file  . Number of children: 1  . Years of education: BA  . Highest education level: Not on file  Occupational History  . Occupation: Retired  Tobacco Use  . Smoking status: Former Smoker    Packs/day: 2.00    Years: 20.00    Pack years: 40.00    Types: Cigarettes    Quit date: 02/15/1995    Years since quitting: 25.4  . Smokeless tobacco: Never Used  Substance and Sexual Activity  . Alcohol use: No  . Drug use: No  . Sexual activity: Never  Other Topics Concern  . Not on file  Social History Narrative   Lives at home. Has home health aids   Drinks 1 cups caffeine daily   Right-handed   Social Determinants of Health   Financial Resource Strain: Not on file  Food Insecurity: Not on file  Transportation Needs: Not on file  Physical Activity: Not on file  Stress: Not on file  Social Connections: Not on file   Family History  Problem Relation Age of Onset  . Heart attack Mother    Allergies   Allergen Reactions  . Flomax [Tamsulosin Hcl] Other (See Comments)    Leg weakness  . Lisinopril Cough  . Lorazepam Other (See Comments)    "i don't like it"  Prefers Xanax or valium  . Uroxatral [Alfuzosin Hcl Er] Other (See Comments)    Leg weakness     Positive ROS: All other systems have been reviewed and were otherwise negative with the exception of those mentioned in the HPI and as above.  Physical Exam: General: Alert, no acute distress Cardiovascular: No pedal edema Respiratory: No cyanosis, no use of accessory musculature GI: No organomegaly, abdomen is soft and non-tender Skin: No lesions in the area of chief complaint Neurologic: Sensation intact distally Psychiatric: Pleasantly confused, able to answer questions regarding pain and fall.  Lymphatic: No axillary or cervical lymphadenopathy  MUSCULOSKELETAL: No TTP to left hip. Immediate severe pain with passive external or internal rotation. Endorses sensation to all aspects of left foot. Able to Dorsiflex and plantarflex  at left ankle. Able to flex and extend all toes. No TTP to left knee. 2+ DP and PT pulses bilaterally.   Assessment/Plan: Left femoral neck fracture - spoke with patient and daughter in room, risks benefits and alternatives of a left hip hemiarthroplasty were discussed with both and they have agreed to move forward with surgical fixation - continue NPO, plan for hemiarthroplasty with Dr. [name] this afternoon   Armida Sans, PA-C    07/07/2020 10:22 AM

## 2020-07-07 NOTE — Anesthesia Postprocedure Evaluation (Signed)
Anesthesia Post Note  Patient: [name]  Procedure(s) Performed: ARTHROPLASTY BIPOLAR HIP (HEMIARTHROPLASTY) (Left Hip)     Patient location during evaluation: PACU Anesthesia Type: General Level of consciousness: awake and alert Pain management: pain level controlled Vital Signs Assessment: post-procedure vital signs reviewed and stable Respiratory status: spontaneous breathing, nonlabored ventilation, respiratory function stable and patient connected to nasal cannula oxygen Cardiovascular status: blood pressure returned to baseline and stable Postop Assessment: no apparent nausea or vomiting Anesthetic complications: no   No complications documented.  Last Vitals:  Vitals:   07/07/20 1700 07/07/20 1715  BP: (!) 145/58 (!) 116/53  Pulse: 63 61  Resp: 19 20  Temp:  36.4 C  SpO2: 92% 95%    Last Pain:  Vitals:   07/07/20 1639  TempSrc:   PainSc: Asleep                 Pervis Macintyre S

## 2020-07-07 NOTE — Plan of Care (Signed)
  Problem: Activity: Goal: Risk for activity intolerance will decrease 07/07/2020 0824 by Penelope Galas, RN Outcome: Progressing 07/06/2020 1849 by Penelope Galas, RN Outcome: Progressing   Problem: Nutrition: Goal: Adequate nutrition will be maintained 07/07/2020 0824 by Penelope Galas, RN Outcome: Progressing 07/06/2020 1849 by Penelope Galas, RN Outcome: Progressing   Problem: Pain Managment: Goal: General experience of comfort will improve 07/07/2020 0824 by Penelope Galas, RN Outcome: Progressing 07/06/2020 1849 by Penelope Galas, RN Outcome: Progressing

## 2020-07-07 NOTE — Hospital Course (Addendum)
80 year old male PMH dementia presented after a fall at home resulting in left femoral neck fracture.  A & P  Acute left femoral neck fracture secondary to mechanical fall --Status post left hip hemiarthroplasty 5/24 Weightbearing: WBAT LLE Insicional and dressing care: Reinforce dressings as needed Orthopedic device(s): patient no longer needs knee immobilizer. VTE prophylaxis: lovenox x 4 weeks Pain control: tylenol prn, tramadol prn q 6 hours Follow - up plan: 2 weeks with Dr. [name]   Acute kidney injury versus CKDStage II or III cannot determine further. --Creatinine appears stable.  Can follow-up in the outpatient setting.  Mild thrombocytopenia and macrocytic anemia --Check anemia panel, follow-up as an outpatient.  Dementia, anxiety --At baseline per daughter --Continue Aricept, Namenda, BuSpar, Depakote  CAD --Quiescent.  Continue aspirin and pravastatin.

## 2020-07-07 NOTE — Transfer of Care (Signed)
Immediate Anesthesia Transfer of Care Note  Patient: [name]  Procedure(s) Performed: ARTHROPLASTY BIPOLAR HIP (HEMIARTHROPLASTY) (Left Hip)  Patient Location: PACU  Anesthesia Type:General  Level of Consciousness: awake, drowsy and responds to stimulation  Airway & Oxygen Therapy: Patient Spontanous Breathing and Patient connected to face mask  Post-op Assessment: Report given to RN and Post -op Vital signs reviewed and stable  Post vital signs: Reviewed and stable  Last Vitals:  Vitals Value Taken Time  BP 119/67 07/07/20 1645  Temp 35.7 C 07/07/20 1639  Pulse 54 07/07/20 1648  Resp 16 07/07/20 1648  SpO2 100 % 07/07/20 1648  Vitals shown include unvalidated device data.  Last Pain:  Vitals:   07/07/20 1639  TempSrc:   PainSc: Asleep      Patients Stated Pain Goal: 2 (07/07/20 0610)  Complications: No complications documented.

## 2020-07-07 NOTE — Progress Notes (Signed)
PROGRESS NOTE    [name]   QPR:[id number]  DOB: [birth year]  PCP: [name], MD    DOA: 07/06/2020 LOS: 1   Brief Narrative   [name] is a 80 y.o. male with medical history significant of CAD, dementia, hx SBO s/p g tube (now removed), hypertension, and multiple other medical problems presenting after an unwitnessed fall at home. He was found to have an acute left femoral neck fracture.  No other acute injuries or abnormalities were identified on CT head, CT cervical spine, and CXR.  Admitted to [facility] service.  Dr. Yevette Galasso with orthopedics was consulted by ED provider to address the Left Hip fracture.   Assessment & Plan   Active Problems:   Closed left hip fracture, initial encounter (HCC)  Acute left femoral neck fracture secondary to mechanical fall -  Orthopedics consulted, taken patient to surgery today. Postop PT evaluation tomorrow.  Expect need for SNF. Pain control per orders. Bowel regimen. DVT prophylaxis per Ortho. Outpatient follow-up with PCP for management of osteoporosis.  Acute kidney injury -likely due to mild dehydration.  Continue IV fluids.  Monitor BMP.  Dementia/anxiety -stable.  Continue Aricept, Namenda.  BuSpar for anxiety.  Depakote for mood stabilization. --Delirium precautions:     -Lights and TV off, minimize interruptions at night    -Blinds open and lights on during day    -Glasses/hearing aid with patient    -Frequent reorientation    -PT/OT when able    -Avoid sedation medications/Beers list medications -- Allow family to visit 24 hours a day to reduce risk for delirium and agitation.  CAD -chronic, stable with no chest pain.  Distant history of stent. Continue aspirin, pravastatin  GERD -continue PPI  History of SBO requiring G-tube -D tube has been DC'd Monitor for signs or symptoms.   Patient BMI: Body mass index is 22.93 kg/m.   DVT prophylaxis: SCDs Start: 07/06/20 1808   Diet:  Diet Orders (From  admission, onward)    Start     Ordered   07/07/20 0430  Diet NPO time specified  Diet effective ____        07/06/20 1817            Code Status: DNR    Subjective 07/07/20    Patient seen just before going to surgery today.  He denied having any significant pain.  His daughter was at bedside had been present for the morning and reported no issues or concerns.     Disposition Plan & Communication   Status is: Inpatient  Remains inpatient appropriate because:Inpatient level of care appropriate due to severity of illness   Dispo: The patient is from: Home              Anticipated d/c is to: SNF              Patient currently is not medically stable to d/c.   Difficult to place patient No  Family Communication: Daughter at bedside on rounds today   Consults, Procedures, Significant Events   Consultants:   Orthopedic surgery  Procedures:   Left hip hemiarthroplasty 5/24  Antimicrobials:  Anti-infectives (From admission, onward)   None        Micro    Objective   Vitals:   07/06/20 2057 07/07/20 0139 07/07/20 0500 07/07/20 0523  BP: (!) 164/56 (!) 148/51  (!) 183/57  Pulse: (!) 54 (!) 49  (!) 54  Resp: 16 16  16   Temp:  98.5 F (36.9 C) 97.6 F (36.4 C)  98.1 F (36.7 C)  TempSrc: Oral Oral  Oral  SpO2: 96% 95%  98%  Weight:   76.7 kg   Height:        Intake/Output Summary (Last 24 hours) at 07/07/2020 0724 Last data filed at 07/07/2020 0600 Gross per 24 hour  Intake 1442.59 ml  Output 900 ml  Net 542.59 ml   Filed Weights   07/06/20 1237 07/07/20 0500  Weight: 77 kg 76.7 kg    Physical Exam:  General exam: awake, alert, no acute distress HEENT: moist mucus membranes, hearing grossly normal  Respiratory system: normal respiratory effort, symmetric chest rise, on room air. Cardiovascular system: RRR, no pedal edema.   Gastrointestinal system: soft, NT, ND,  Central nervous system: Alert, oriented to self, no gross focal neurologic  deficits, normal speech Extremities: Left lower extremity shortened and externally rotated, no edema Skin: dry, intact, normal temperature Psychiatry: normal mood, congruent affect, abnormal judgement and insight due to dementia  Labs   Data Reviewed: I have personally reviewed following labs and imaging studies  CBC: Recent Labs  Lab 07/06/20 1406 07/07/20 0258  WBC 7.9 7.1  NEUTROABS 5.4  --   HGB 12.5* 11.1*  HCT 38.3* 34.4*  MCV 101.1* 100.6*  PLT 168 148*   Basic Metabolic Panel: Recent Labs  Lab 07/06/20 1406 07/07/20 0258  NA 138 140  K 4.1 4.4  CL 109 108  CO2 24 27  GLUCOSE 95 101*  BUN 29* 27*  CREATININE 1.50* 1.48*  CALCIUM 8.4* 8.4*   GFR: Estimated Creatinine Clearance: 43.2 mL/min (A) (by C-G formula based on SCr of 1.48 mg/dL (H)). Liver Function Tests: Recent Labs  Lab 07/07/20 0258  AST 16  ALT 13  ALKPHOS 75  BILITOT 0.3  PROT 5.8*  ALBUMIN 2.8*   No results for input(s): LIPASE, AMYLASE in the last 168 hours. No results for input(s): AMMONIA in the last 168 hours. Coagulation Profile: Recent Labs  Lab 07/07/20 0258  INR 1.1   Cardiac Enzymes: No results for input(s): CKTOTAL, CKMB, CKMBINDEX, TROPONINI in the last 168 hours. BNP (last 3 results) No results for input(s): PROBNP in the last 8760 hours. HbA1C: No results for input(s): HGBA1C in the last 72 hours. CBG: No results for input(s): GLUCAP in the last 168 hours. Lipid Profile: No results for input(s): CHOL, HDL, LDLCALC, TRIG, CHOLHDL, LDLDIRECT in the last 72 hours. Thyroid Function Tests: No results for input(s): TSH, T4TOTAL, FREET4, T3FREE, THYROIDAB in the last 72 hours. Anemia Panel: No results for input(s): VITAMINB12, FOLATE, FERRITIN, TIBC, IRON, RETICCTPCT in the last 72 hours. Sepsis Labs: No results for input(s): PROCALCITON, LATICACIDVEN in the last 168 hours.  Recent Results (from the past 240 hour(s))  Resp Panel by RT-PCR (Flu A&B, Covid)  Nasopharyngeal Swab     Status: None   Collection Time: 07/06/20  2:06 PM   Specimen: Nasopharyngeal Swab; Nasopharyngeal(NP) swabs in vial transport medium  Result Value Ref Range Status   SARS Coronavirus 2 by RT PCR NEGATIVE NEGATIVE Final    Comment: (NOTE) SARS-CoV-2 target nucleic acids are NOT DETECTED.  The SARS-CoV-2 RNA is generally detectable in upper respiratory specimens during the acute phase of infection. The lowest concentration of SARS-CoV-2 viral copies this assay can detect is 138 copies/mL. A negative result does not preclude SARS-Cov-2 infection and should not be used as the sole basis for treatment or other patient management decisions. A negative result may  occur with  improper specimen collection/handling, submission of specimen other than nasopharyngeal swab, presence of viral mutation(s) within the areas targeted by this assay, and inadequate number of viral copies(<138 copies/mL). A negative result must be combined with clinical observations, patient history, and epidemiological information. The expected result is Negative.  Fact Sheet for Patients:  BloggerCourse.com  Fact Sheet for Healthcare Providers:  SeriousBroker.it  This test is no t yet approved or cleared by the [place] FDA and  has been authorized for detection and/or diagnosis of SARS-CoV-2 by FDA under an Emergency Use Authorization (EUA). This EUA will remain  in effect (meaning this test can be used) for the duration of the COVID-19 declaration under Section 564(b)(1) of the Act, 21 U.S.C.section 360bbb-3(b)(1), unless the authorization is terminated  or revoked sooner.       Influenza A by PCR NEGATIVE NEGATIVE Final   Influenza B by PCR NEGATIVE NEGATIVE Final    Comment: (NOTE) The Xpert Xpress SARS-CoV-2/FLU/RSV plus assay is intended as an aid in the diagnosis of influenza from Nasopharyngeal swab specimens and should not be  used as a sole basis for treatment. Nasal washings and aspirates are unacceptable for Xpert Xpress SARS-CoV-2/FLU/RSV testing.  Fact Sheet for Patients: BloggerCourse.com  Fact Sheet for Healthcare Providers: SeriousBroker.it  This test is not yet approved or cleared by the [place] FDA and has been authorized for detection and/or diagnosis of SARS-CoV-2 by FDA under an Emergency Use Authorization (EUA). This EUA will remain in effect (meaning this test can be used) for the duration of the COVID-19 declaration under Section 564(b)(1) of the Act, 21 U.S.C. section 360bbb-3(b)(1), unless the authorization is terminated or revoked.  Performed at [facility], [address] [place], [place] [address]   Surgical PCR screen     Status: Abnormal   Collection Time: 07/06/20  6:27 PM   Specimen: Nasal Mucosa; Nasal Swab  Result Value Ref Range Status   MRSA, PCR POSITIVE (A) NEGATIVE Final    Comment: RESULT CALLED TO, READ BACK BY AND VERIFIED WITH: rn c anderson at  2116 07/06/20 [place]hank a    Staphylococcus aureus POSITIVE (A) NEGATIVE Final    Comment: (NOTE) The Xpert SA Assay (FDA approved for NASAL specimens in patients 39 years of age and older), is one component of a comprehensive surveillance program. It is not intended to diagnose infection nor to guide or monitor treatment. Performed at [facility], [address], [place], [place] [address]       Imaging Studies   CT Head Wo Contrast  Result Date: 07/06/2020 CLINICAL DATA:  Fall EXAM: CT HEAD WITHOUT CONTRAST TECHNIQUE: Contiguous axial images were obtained from the base of the skull through the vertex without intravenous contrast. COMPARISON:  None. FINDINGS: Brain: There is no acute intracranial hemorrhage, mass effect, or edema. Gray-white differentiation is preserved. There is no extra-axial fluid collection.  Prominence of the ventricles and sulci reflects generalized parenchymal volume loss. Patchy hypoattenuation in the supratentorial white matter is nonspecific but probably reflects moderate chronic microvascular ischemic changes. Vascular: There is atherosclerotic calcification at the skull base. Skull: Calvarium is unremarkable. Sinuses/Orbits: No acute finding. Other: None. IMPRESSION: No evidence of acute intracranial injury. Electronically Signed   By: Guadlupe Spanish M.D.   On: 07/06/2020 15:47   CT Cervical Spine Wo Contrast  Result Date: 07/06/2020 CLINICAL DATA:  Fall EXAM: CT CERVICAL SPINE WITHOUT CONTRAST TECHNIQUE: Multidetector CT imaging of the cervical spine was performed without  intravenous contrast. Multiplanar CT image reconstructions were also generated. COMPARISON:  None. FINDINGS: Alignment: No significant anteroposterior listhesis. Skull base and vertebrae: No acute cervical spine fracture. Vertebral body heights are maintained. Soft tissues and spinal canal: No prevertebral fluid or swelling. No visible canal hematoma. Disc levels: Multilevel degenerative changes are present including disc space narrowing, endplate osteophytes, and facet and uncovertebral hypertrophy. Upper chest: No apical lung mass. Other: None. IMPRESSION: No acute cervical spine fracture. Electronically Signed   By: Guadlupe SpanishPraneil  [name] M.D.   On: 07/06/2020 15:50   DG CHEST PORT 1 VIEW  Result Date: 07/06/2020 CLINICAL DATA:  80 year old male with fall. EXAM: PORTABLE CHEST 1 VIEW COMPARISON:  Chest radiograph dated 12/01/2017 FINDINGS: No focal consolidation, pleural effusion or pneumothorax. The cardiac silhouette is within limits. Atherosclerotic calcification of the aorta. No acute osseous pathology. IMPRESSION: No active disease. Electronically Signed   By: Elgie CollardArash  Radparvar M.D.   On: 07/06/2020 17:55   DG Hip Unilat With Pelvis 2-3 Views Left  Result Date: 07/06/2020 CLINICAL DATA:  Left hip and femur pain after  a fall today. Initial encounter. EXAM: DG HIP (WITH OR WITHOUT PELVIS) 2-3V LEFT COMPARISON:  None. FINDINGS: The patient has an acute subcapital fracture of the left hip. Nondisplaced component of the fracture extends more inferiorly into the femoral neck. The fracture is minimally impacted. No other acute abnormality is seen. IMPRESSION: Acute left femoral neck fracture. Electronically Signed   By: Drusilla Kannerhomas  Dalessio M.D.   On: 07/06/2020 15:46   DG FEMUR MIN 2 VIEWS LEFT  Result Date: 07/06/2020 CLINICAL DATA:  Left hip and femur pain after a fall today. Initial encounter. EXAM: LEFT FEMUR 2 VIEWS COMPARISON:  None. FINDINGS: Left hip fracture is identified joint abnormality. No other acute bony or joint abnormality is seen. Soft tissues are negative. IMPRESSION: Acute left hip fracture. Please see report of dedicated plain films of the left hip today. Electronically Signed   By: Drusilla Kannerhomas  Dalessio M.D.   On: 07/06/2020 15:45     Medications   Scheduled Meds: . acetaminophen  1,000 mg Oral Q8H  . aspirin EC  81 mg Oral Daily  . busPIRone  5 mg Oral BID  . Chlorhexidine Gluconate Cloth  6 each Topical Q0600  . divalproex  125 mg Oral Q12H  . donepezil  5 mg Oral QHS  . memantine  10 mg Oral BID  . mupirocin ointment  1 application Nasal BID  . pantoprazole  40 mg Oral Daily  . polyethylene glycol  17 g Oral Daily  . pravastatin  40 mg Oral Daily   Continuous Infusions: . lactated ringers 75 mL/hr at 07/06/20 1818       LOS: 1 day    Time spent: 25 minutes    Pennie BanterKelly A Damarea Merkel, DO [facility]  07/07/2020, 7:24 AM      If 7PM-7AM, please contact night-coverage. How to contact the [facility] Attending or Consulting provider 7A - 7P or covering provider during after hours 7P -7A, for this patient?    1. Check the care team in [facility] and look for a) attending/consulting [facility] provider listed and b) the [facility] team listed 2. Log into [email or web address] and use [place]'s universal  password to access. If you do not have the password, please contact the hospital operator. 3. Locate the [facility] provider you are looking for under [facility] and page to a number that you can be directly reached. 4. If you still have difficulty reaching the provider,  please page the [facility] (Director on Call) for the Hospitalists listed on amion for assistance.

## 2020-07-07 NOTE — Anesthesia Preprocedure Evaluation (Addendum)
Anesthesia Evaluation  Patient identified by MRN, date of birth, ID band Patient awake    Reviewed: Allergy & Precautions, NPO status , Patient's Chart, lab work & pertinent test results  Airway Mallampati: III  TM Distance: >3 FB Neck ROM: Full    Dental  (+) Teeth Intact, Dental Advisory Given   Pulmonary neg pulmonary ROS, former smoker,    Pulmonary exam normal breath sounds clear to auscultation       Cardiovascular hypertension, Pt. on medications + CAD and + Cardiac Stents  Normal cardiovascular exam Rhythm:Regular Rate:Normal  TTE 2018 - Extremely limited; definity used; normal LV systolic function; grade 1 diastolic function; mild AI  Stress Test 2015 negative   Neuro/Psych PSYCHIATRIC DISORDERS Anxiety Depression Dementia negative neurological ROS     GI/Hepatic Neg liver ROS, PUD, GERD  ,  Endo/Other  negative endocrine ROS  Renal/GU Renal InsufficiencyRenal disease (Cr 1.48, K 4.4)  negative genitourinary   Musculoskeletal negative musculoskeletal ROS (+)   Abdominal   Peds  Hematology negative hematology ROS (+)   Anesthesia Other Findings   Reproductive/Obstetrics                            Anesthesia Physical Anesthesia Plan  ASA: III  Anesthesia Plan: Spinal   Post-op Pain Management:    Induction:   PONV Risk Score and Plan: 1 and Treatment may vary due to age or medical condition, Propofol infusion and Ondansetron  Airway Management Planned: Natural Airway  Additional Equipment:   Intra-op Plan:   Post-operative Plan:   Informed Consent: I have reviewed the patients History and Physical, chart, labs and discussed the procedure including the risks, benefits and alternatives for the proposed anesthesia with the patient or authorized representative who has indicated his/her understanding and acceptance.   Patient has DNR.  Discussed DNR with patient and  Suspend DNR.   Dental advisory given  Plan Discussed with: CRNA  Anesthesia Plan Comments:         Anesthesia Quick Evaluation

## 2020-07-07 NOTE — Op Note (Signed)
07/06/2020 - 07/07/2020  3:59 PM  PATIENT:  [name]   MRN: [id number]  PRE-OPERATIVE DIAGNOSIS: Left femoral neck fracture  POST-OPERATIVE DIAGNOSIS: Left femoral neck fracture  PROCEDURE:  Procedure(s): Cemented left hip hemiarthroplasty  PREOPERATIVE INDICATIONS:  [name] is an 80 y.o. male who was admitted 07/06/2020 with a diagnosis of left hip femoral neck fracture and elected for surgical management.  The risks benefits and alternatives were discussed with the patient including but not limited to the risks of nonoperative treatment, versus surgical intervention including infection, bleeding, nerve injury, periprosthetic fracture, the need for revision surgery, dislocation, leg length discrepancy, blood clots, cardiopulmonary complications, morbidity, mortality, among others, and they were willing to proceed.  Predicted outcome is good, although there will be at least a six to nine month expected recovery.   OPERATIVE REPORT     SURGEON:  Marchia Bond, MD    ASSISTANT:  Merlene Pulling, PA-C,   (Present throughout the entire procedure,  necessary for completion of procedure in a timely manner, assisting with retraction, instrumentation, and closure)     ANESTHESIA: Spinal converted to general  ESTIMATED BLOOD LOSS: 568 mL    COMPLICATIONS:  None.   UNIQUE ASPECTS OF THE CASE: The femoral neck fracture had a posterior component of it that extended down distal that may be a little bit uncomfortable with press fitting, and additionally after the trial prosthesis was in, it was rotationally unstable with a size 6.  I worked the size 7 broach fairly aggressively but was not able to get it down, and so I elected to convert to cementing the prosthesis.     COMPONENTS:  Depuy [place] Basic cemented femoral Fracture stem size 6, with a -3 spacer and a size 56 fracture head unipolar hip ball.    PROCEDURE IN DETAIL: The patient was met in the holding area and identified.  The  appropriate hip  was marked at the operative site. The patient was then transported to the OR and  placed under anesthesia.  At that point, the patient was  placed in the lateral decubitus position with the operative side up and  secured to the operating room table and all bony prominences padded.     The operative lower extremity was prepped from the iliac crest to the toes.  Sterile draping was performed.  Time out was performed prior to incision.      A routine posterolateral approach was utilized via sharp dissection  carried down to the subcutaneous tissue.  Gross bleeders were Bovie  coagulated.  The iliotibial band was identified and incised  along the length of the skin incision.  Self-retaining retractors were  inserted.  With the hip internally rotated, the short external rotators  were identified. The piriformis was tagged with FiberWire, and the hip capsule released in a T-type fashion.  The femoral neck was exposed, and I resected the femoral neck using the appropriate jig. This was performed at approximately a thumb's breadth above the lesser trochanter.    I then exposed the deep acetabulum, cleared out any tissue including the ligamentum teres, and included the hip capsule in the FiberWire used above and below the T.    I then prepared the proximal femur using the cookie-cutter, the lateralizing reamer, and then sequentially broached.  A trial utilized, and I reduced the hip and it was found to have excellent stability with functional range of motion. The trial components were then removed.  The trial broach was rotationally  unstable, and so I tried to go up to a 7 but could not get it fully down despite lateralization and gentle broaching, so I elected to cement the size 6 into place.  I prepared the canal with the cement brush, placed a size 3 canal restrictor, and then cemented the real implant and allowed it to cure, and then and I impacted the real head ball into place. The hip was  then reduced and taken through functional range of motion and found to have excellent stability. Leg lengths were restored.  I then used a 2 mm drill bits to pass the FiberWire suture from the capsule and piriformis through the greater trochanter, and secured this. Excellent posterior capsular repair was achieved. I also closed the T in the capsule.  I then irrigated the hip copiously again with pulse lavage, and repaired the fascia with Vicryl, followed by Vicryl for the subcutaneous tissue, Monocryl for the skin, Steri-Strips and sterile gauze. The wounds were injected. The patient was then awakened and returned to PACU in stable and satisfactory condition. There were no complications.   , MD Orthopedic Surgeon [phone]   07/07/2020 3:59 PM    

## 2020-07-07 NOTE — Discharge Instructions (Signed)
INSTRUCTIONS AFTER JOINT REPLACEMENT   o Remove items at home which could result in a fall. This includes throw rugs or furniture in walking pathways o ICE to the affected joint every three hours while awake for 30 minutes at a time, for at least the first 3-5 days, and then as needed for pain and swelling.  Continue to use ice for pain and swelling. You may notice swelling that will progress down to the foot and ankle.  This is normal after surgery.  Elevate your leg when you are not up walking on it.   o Continue to use the breathing machine you got in the hospital (incentive spirometer) which will help keep your temperature down.  It is common for your temperature to cycle up and down following surgery, especially at night when you are not up moving around and exerting yourself.  The breathing machine keeps your lungs expanded and your temperature down.   DIET:  As you were doing prior to hospitalization, we recommend a well-balanced diet.  DRESSING / WOUND CARE / SHOWERING  You may change your dressing 3-5 days after surgery.  Then change the dressing every day with sterile gauze.  Please use good hand washing techniques before changing the dressing.  Do not use any lotions or creams on the incision until instructed by your surgeon.  ACTIVITY  o Increase activity slowly as tolerated, but follow the weight bearing instructions below.   o No driving for 6 weeks or until further direction given by your physician.  You cannot drive while taking narcotics.  o No lifting or carrying greater than 10 lbs. until further directed by your surgeon. o Avoid periods of inactivity such as sitting longer than an hour when not asleep. This helps prevent blood clots.  o You may return to work once you are authorized by your doctor.     WEIGHT BEARING   Weight bearing as tolerated with assist device (walker, cane, etc) as directed, use it as long as suggested by your surgeon or therapist, typically at  least 4-6 weeks.   EXERCISES  Results after joint replacement surgery are often greatly improved when you follow the exercise, range of motion and muscle strengthening exercises prescribed by your doctor. Safety measures are also important to protect the joint from further injury. Any time any of these exercises cause you to have increased pain or swelling, decrease what you are doing until you are comfortable again and then slowly increase them. If you have problems or questions, call your caregiver or physical therapist for advice.   Rehabilitation is important following a joint replacement. After just a few days of immobilization, the muscles of the leg can become weakened and shrink (atrophy).  These exercises are designed to build up the tone and strength of the thigh and leg muscles and to improve motion. Often times heat used for twenty to thirty minutes before working out will loosen up your tissues and help with improving the range of motion but do not use heat for the first two weeks following surgery (sometimes heat can increase post-operative swelling).   These exercises can be done on a training (exercise) mat, on the floor, on a table or on a bed. Use whatever works the best and is most comfortable for you.    Use music or television while you are exercising so that the exercises are a pleasant break in your day. This will make your life better with the exercises acting as a break   in your routine that you can look forward to.   Perform all exercises about fifteen times, three times per day or as directed.  You should exercise both the operative leg and the other leg as well.  Exercises include:   . Quad Sets - Tighten up the muscle on the front of the thigh (Quad) and hold for 5-10 seconds.   . Straight Leg Raises - With your knee straight (if you were given a brace, keep it on), lift the leg to 60 degrees, hold for 3 seconds, and slowly lower the leg.  Perform this exercise against  resistance later as your leg gets stronger.  . Leg Slides: Lying on your back, slowly slide your foot toward your buttocks, bending your knee up off the floor (only go as far as is comfortable). Then slowly slide your foot back down until your leg is flat on the floor again.  . Angel Wings: Lying on your back spread your legs to the side as far apart as you can without causing discomfort.  . Hamstring Strength:  Lying on your back, push your heel against the floor with your leg straight by tightening up the muscles of your buttocks.  Repeat, but this time bend your knee to a comfortable angle, and push your heel against the floor.  You may put a pillow under the heel to make it more comfortable if necessary.   A rehabilitation program following joint replacement surgery can speed recovery and prevent re-injury in the future due to weakened muscles. Contact your doctor or a physical therapist for more information on knee rehabilitation.    CONSTIPATION  Constipation is defined medically as fewer than three stools per week and severe constipation as less than one stool per week.  Even if you have a regular bowel pattern at home, your normal regimen is likely to be disrupted due to multiple reasons following surgery.  Combination of anesthesia, postoperative narcotics, change in appetite and fluid intake all can affect your bowels.   YOU MUST use at least one of the following options; they are listed in order of increasing strength to get the job done.  They are all available over the counter, and you may need to use some, POSSIBLY even all of these options:    Drink plenty of fluids (prune juice may be helpful) and high fiber foods Colace 100 mg by mouth twice a day  Senokot for constipation as directed and as needed Dulcolax (bisacodyl), take with full glass of water  Miralax (polyethylene glycol) once or twice a day as needed.  If you have tried all these things and are unable to have a bowel  movement in the first 3-4 days after surgery call either your surgeon or your primary doctor.    If you experience loose stools or diarrhea, hold the medications until you stool forms back up.  If your symptoms do not get better within 1 week or if they get worse, check with your doctor.  If you experience "the worst abdominal pain ever" or develop nausea or vomiting, please contact the office immediately for further recommendations for treatment.   ITCHING:  If you experience itching with your medications, try taking only a single pain pill, or even half a pain pill at a time.  You can also use Benadryl over the counter for itching or also to help with sleep.   TED HOSE STOCKINGS:  Use stockings on both legs until for at least 2 weeks or as   directed by physician office. They may be removed at night for sleeping.  MEDICATIONS:  See your medication summary on the "After Visit Summary" that nursing will review with you.  You may have some home medications which will be placed on hold until you complete the course of blood thinner medication.  It is important for you to complete the blood thinner medication as prescribed.  PRECAUTIONS:  If you experience chest pain or shortness of breath - call 911 immediately for transfer to the hospital emergency department.   If you develop a fever greater that 101 F, purulent drainage from wound, increased redness or drainage from wound, foul odor from the wound/dressing, or calf pain - CONTACT YOUR SURGEON.                                                   FOLLOW-UP APPOINTMENTS:  If you do not already have a post-op appointment, please call the office for an appointment to be seen by your surgeon.  Guidelines for how soon to be seen are listed in your "After Visit Summary", but are typically between 1-4 weeks after surgery.  OTHER INSTRUCTIONS:   POST-OPERATIVE OPIOID TAPER INSTRUCTIONS: . It is important to wean off of your opioid medication as soon as  possible. If you do not need pain medication after your surgery it is ok to stop day one. . Opioids include: o Codeine, Hydrocodone(Norco, Vicodin), Oxycodone(Percocet, oxycontin) and hydromorphone amongst others.  . Long term and even short term use of opiods can cause: o Increased pain response o Dependence o Constipation o Depression o Respiratory depression o And more.  . Withdrawal symptoms can include o Flu like symptoms o Nausea, vomiting o And more . Techniques to manage these symptoms o Hydrate well o Eat regular healthy meals o Stay active o Use relaxation techniques(deep breathing, meditating, yoga) . Do Not substitute Alcohol to help with tapering . If you have been on opioids for less than two weeks and do not have pain than it is ok to stop all together.  . Plan to wean off of opioids o This plan should start within one week post op of your joint replacement. o Maintain the same interval or time between taking each dose and first decrease the dose.  o Cut the total daily intake of opioids by one tablet each day o Next start to increase the time between doses. o The last dose that should be eliminated is the evening dose.     MAKE SURE YOU:  . Understand these instructions.  . Get help right away if you are not doing well or get worse.    Thank you for letting us be a part of your medical care team.  It is a privilege we respect greatly.  We hope these instructions will help you stay on track for a fast and full recovery!      

## 2020-07-08 ENCOUNTER — Encounter (HOSPITAL_COMMUNITY): Payer: Self-pay | Admitting: Orthopedic Surgery

## 2020-07-08 DIAGNOSIS — D539 Nutritional anemia, unspecified: Secondary | ICD-10-CM

## 2020-07-08 DIAGNOSIS — N179 Acute kidney failure, unspecified: Secondary | ICD-10-CM

## 2020-07-08 DIAGNOSIS — D696 Thrombocytopenia, unspecified: Secondary | ICD-10-CM

## 2020-07-08 DIAGNOSIS — S72002A Fracture of unspecified part of neck of left femur, initial encounter for closed fracture: Secondary | ICD-10-CM | POA: Diagnosis not present

## 2020-07-08 DIAGNOSIS — F0390 Unspecified dementia without behavioral disturbance: Secondary | ICD-10-CM

## 2020-07-08 LAB — CBC
HCT: 31.8 % — ABNORMAL LOW (ref 39.0–52.0)
Hemoglobin: 10.1 g/dL — ABNORMAL LOW (ref 13.0–17.0)
MCH: 32.6 pg (ref 26.0–34.0)
MCHC: 31.8 g/dL (ref 30.0–36.0)
MCV: 102.6 fL — ABNORMAL HIGH (ref 80.0–100.0)
Platelets: 119 10*3/uL — ABNORMAL LOW (ref 150–400)
RBC: 3.1 MIL/uL — ABNORMAL LOW (ref 4.22–5.81)
RDW: 12.3 % (ref 11.5–15.5)
WBC: 8.9 10*3/uL (ref 4.0–10.5)
nRBC: 0 % (ref 0.0–0.2)

## 2020-07-08 LAB — BASIC METABOLIC PANEL
Anion gap: 7 (ref 5–15)
BUN: 22 mg/dL (ref 8–23)
CO2: 25 mmol/L (ref 22–32)
Calcium: 7.9 mg/dL — ABNORMAL LOW (ref 8.9–10.3)
Chloride: 104 mmol/L (ref 98–111)
Creatinine, Ser: 1.24 mg/dL (ref 0.61–1.24)
GFR, Estimated: 59 mL/min — ABNORMAL LOW (ref >60)
Glucose, Bld: 130 mg/dL — ABNORMAL HIGH (ref 70–99)
Potassium: 4.6 mmol/L (ref 3.5–5.1)
Sodium: 136 mmol/L (ref 135–145)

## 2020-07-08 NOTE — Plan of Care (Signed)
  Problem: Pain Management: Goal: Pain level will decrease Outcome: Progressing   Problem: Safety: Goal: Ability to remain free from injury will improve Outcome: Progressing   

## 2020-07-08 NOTE — Progress Notes (Signed)
PROGRESS NOTE  [name]:[id number] DOB: 1940-07-19 DOA: 07/06/2020 PCP: [name], MD  Brief History   80 year old male PMH dementia presented after a fall at home resulting in left femoral neck fracture.  A & P  Acute left femoral neck fracture secondary to mechanical fall --Status post left hip hemiarthroplasty 5/24 . Weightbearing: WBAT LLE . Insicional and dressing care: Reinforce dressings as needed . Orthopedic device(s): patient no longer needs knee immobilizer. . VTE prophylaxis: lovenox x 4 weeks . Pain control: tylenol prn, tramadol prn q 6 hours . Follow - up plan: 2 weeks with Dr. [name]   Acute kidney injury versus CKDStage II or III cannot determine further. --Creatinine appears stable.  Can follow-up in the outpatient setting.  Mild thrombocytopenia and macrocytic anemia --Check anemia panel, follow-up as an outpatient.  Dementia, anxiety --At baseline per daughter --Continue Aricept, Namenda, BuSpar, Depakote  CAD --Quiescent.  Continue aspirin and pravastatin.   Disposition Plan:  Discussion:   Status is: Inpatient  Remains inpatient appropriate because:Inpatient level of care appropriate due to severity of illness   Dispo: The patient is from: Home              Anticipated d/c is to: SNF              Patient currently is not medically stable to d/c.   Difficult to place patient No   DVT prophylaxis: enoxaparin (LOVENOX) injection 40 mg Start: 07/08/20 0800 SCDs Start: 07/07/20 1749 Place TED hose Start: 07/07/20 1749 SCDs Start: 07/06/20 1808   Code Status: DNR Level of care: Med-Surg Family Communication: daughter at bedside  Brendia Sacks, MD  [facility] Direct contact: see [email or web address] (further directions at bottom of note if needed) 7PM-7AM contact night coverage as at bottom of note 07/08/2020, 6:45 PM  LOS: 2 days   Significant Hospital Events   . 523 admit for hip fx   Consults:  . orthopedics   Procedures:   . Cemented left hip hemiarthroplasty  Interval History/Subjective  CC: f/u hip fx  Feels well daughter reports pt at baseline  Objective   Vitals:  Vitals:   07/08/20 0922 07/08/20 1347  BP: (!) 147/46 (!) 147/67  Pulse: 81 64  Resp: 18 18  Temp: 99.8 F (37.7 C) 98.4 F (36.9 C)  SpO2: 93% 99%    Exam:  Constitutional:   . Appears calm and comfortable ENMT:  . grossly normal hearing  Respiratory:  . CTA bilaterally, no w/r/r.  . Respiratory effort normal.  Cardiovascular:  . RRR, no m/r/g . No sig LE extremity edema   Psychiatric:  . Mental status o Mood, affect appropriate  I have personally reviewed the following:   Today's Data  . Creatinine down to 1.24, hemoglobin down slightly 10.1, platelets down 119  Scheduled Meds: . aspirin EC  81 mg Oral Daily  . busPIRone  5 mg Oral BID  . divalproex  125 mg Oral Q12H  . docusate sodium  100 mg Oral BID  . donepezil  5 mg Oral QHS  . enoxaparin (LOVENOX) injection  40 mg Subcutaneous Q24H  . ferrous sulfate  325 mg Oral TID PC  . memantine  10 mg Oral BID  . mupirocin ointment  1 application Nasal BID  . pantoprazole  40 mg Oral Daily  . polyethylene glycol  17 g Oral Daily  . pravastatin  40 mg Oral Daily  . senna  1 tablet Oral BID   Continuous Infusions:  Principal Problem:   Closed left hip fracture, initial encounter (HCC) Active Problems:   AKI (acute kidney injury) (HCC)   Thrombocytopenia (HCC)   Macrocytic anemia   Dementia (HCC)   LOS: 2 days   How to contact the [facility] Attending or Consulting provider 7A - 7P or covering provider during after hours 7P -7A, for this patient?  1. Check the care team in [facility] and look for a) attending/consulting [facility] provider listed and b) the [facility] team listed 2. Log into [email or web address] and use [place]'s universal password to access. If you do not have the password, please contact the hospital operator. 3. Locate the [facility] provider you are looking for under  [facility] and page to a number that you can be directly reached. 4. If you still have difficulty reaching the provider, please page the [name] (Director on Call) for the Hospitalists listed on amion for assistance.

## 2020-07-08 NOTE — Progress Notes (Signed)
Subjective: 1 Day Post-Op s/p Procedure(s): ARTHROPLASTY BIPOLAR HIP (HEMIARTHROPLASTY)   Patient is alert, pleasantly confused. Able to answer all questions. States left hip pain is moderate today at rest, worse with any movement.  Denies chest pain, SOB, Calf pain. No nausea/vomiting. No other complaints.    Objective:  PE: VITALS:   Vitals:   07/07/20 2252 07/08/20 0150 07/08/20 0500 07/08/20 0554  BP: (!) 148/56 138/70  (!) 154/56  Pulse: 63 71  76  Resp: 16 16  16   Temp: 97.8 F (36.6 C) 98.8 F (37.1 C)  99.8 F (37.7 C)  TempSrc: Oral Oral  Oral  SpO2: 92% 93%  92%  Weight:   76.9 kg   Height:        ABD soft Sensation intact distally Intact pulses distally Dorsiflexion/Plantar flexion intact Incision: no drainage  LABS  Results for orders placed or performed during the hospital encounter of 07/06/20 (from the past 24 hour(s))  Basic metabolic panel     Status: Abnormal   Collection Time: 07/08/20  2:59 AM  Result Value Ref Range   Sodium 136 135 - 145 mmol/L   Potassium 4.6 3.5 - 5.1 mmol/L   Chloride 104 98 - 111 mmol/L   CO2 25 22 - 32 mmol/L   Glucose, Bld 130 (H) 70 - 99 mg/dL   BUN 22 8 - 23 mg/dL   Creatinine, Ser 07/10/20 0.61 - 1.24 mg/dL   Calcium 7.9 (L) 8.9 - 10.3 mg/dL   GFR, Estimated 59 (L) >60 mL/min   Anion gap 7 5 - 15  CBC     Status: Abnormal   Collection Time: 07/08/20  2:59 AM  Result Value Ref Range   WBC 8.9 4.0 - 10.5 K/uL   RBC 3.10 (L) 4.22 - 5.81 MIL/uL   Hemoglobin 10.1 (L) 13.0 - 17.0 g/dL   HCT 07/10/20 (L) 61.4 - 70.9 %   MCV 102.6 (H) 80.0 - 100.0 fL   MCH 32.6 26.0 - 34.0 pg   MCHC 31.8 30.0 - 36.0 g/dL   RDW 29.5 74.7 - 34.0 %   Platelets 119 (L) 150 - 400 K/uL   nRBC 0.0 0.0 - 0.2 %    CT Head Wo Contrast  Result Date: 07/06/2020 CLINICAL DATA:  Fall EXAM: CT HEAD WITHOUT CONTRAST TECHNIQUE: Contiguous axial images were obtained from the base of the skull through the vertex without intravenous contrast.  COMPARISON:  None. FINDINGS: Brain: There is no acute intracranial hemorrhage, mass effect, or edema. Gray-white differentiation is preserved. There is no extra-axial fluid collection. Prominence of the ventricles and sulci reflects generalized parenchymal volume loss. Patchy hypoattenuation in the supratentorial white matter is nonspecific but probably reflects moderate chronic microvascular ischemic changes. Vascular: There is atherosclerotic calcification at the skull base. Skull: Calvarium is unremarkable. Sinuses/Orbits: No acute finding. Other: None. IMPRESSION: No evidence of acute intracranial injury. Electronically Signed   By: 07/08/2020 M.D.   On: 07/06/2020 15:47   CT Cervical Spine Wo Contrast  Result Date: 07/06/2020 CLINICAL DATA:  Fall EXAM: CT CERVICAL SPINE WITHOUT CONTRAST TECHNIQUE: Multidetector CT imaging of the cervical spine was performed without intravenous contrast. Multiplanar CT image reconstructions were also generated. COMPARISON:  None. FINDINGS: Alignment: No significant anteroposterior listhesis. Skull base and vertebrae: No acute cervical spine fracture. Vertebral body heights are maintained. Soft tissues and spinal canal: No prevertebral fluid or swelling. No visible canal hematoma. Disc levels: Multilevel degenerative changes are present including disc space  narrowing, endplate osteophytes, and facet and uncovertebral hypertrophy. Upper chest: No apical lung mass. Other: None. IMPRESSION: No acute cervical spine fracture. Electronically Signed   By: Guadlupe Spanish M.D.   On: 07/06/2020 15:50   Pelvis Portable  Result Date: 07/07/2020 CLINICAL DATA:  80 year old male status post total left hip arthroplasty. EXAM: DG HIP (WITH OR WITHOUT PELVIS) 1V PORT LEFT; PORTABLE PELVIS 1-2 VIEWS COMPARISON:  Left hip radiograph dated 07/06/2020. FINDINGS: There is a total left hip arthroplasty. The arthroplasty components appear intact and in anatomic alignment. There is no acute  fracture or dislocation. The bones are osteopenic. Postsurgical changes of the soft tissues of the left hip. IMPRESSION: Total left hip arthroplasty. No acute fracture or dislocation. Electronically Signed   By: Elgie Collard M.D.   On: 07/07/2020 17:38   DG CHEST PORT 1 VIEW  Result Date: 07/06/2020 CLINICAL DATA:  80 year old male with fall. EXAM: PORTABLE CHEST 1 VIEW COMPARISON:  Chest radiograph dated 12/01/2017 FINDINGS: No focal consolidation, pleural effusion or pneumothorax. The cardiac silhouette is within limits. Atherosclerotic calcification of the aorta. No acute osseous pathology. IMPRESSION: No active disease. Electronically Signed   By: Elgie Collard M.D.   On: 07/06/2020 17:55   DG Hip Port Unilat With Pelvis 1V Left  Result Date: 07/07/2020 CLINICAL DATA:  80 year old male status post total left hip arthroplasty. EXAM: DG HIP (WITH OR WITHOUT PELVIS) 1V PORT LEFT; PORTABLE PELVIS 1-2 VIEWS COMPARISON:  Left hip radiograph dated 07/06/2020. FINDINGS: There is a total left hip arthroplasty. The arthroplasty components appear intact and in anatomic alignment. There is no acute fracture or dislocation. The bones are osteopenic. Postsurgical changes of the soft tissues of the left hip. IMPRESSION: Total left hip arthroplasty. No acute fracture or dislocation. Electronically Signed   By: Elgie Collard M.D.   On: 07/07/2020 17:38   DG Hip Unilat With Pelvis 2-3 Views Left  Result Date: 07/06/2020 CLINICAL DATA:  Left hip and femur pain after a fall today. Initial encounter. EXAM: DG HIP (WITH OR WITHOUT PELVIS) 2-3V LEFT COMPARISON:  None. FINDINGS: The patient has an acute subcapital fracture of the left hip. Nondisplaced component of the fracture extends more inferiorly into the femoral neck. The fracture is minimally impacted. No other acute abnormality is seen. IMPRESSION: Acute left femoral neck fracture. Electronically Signed   By: Drusilla Kanner M.D.   On: 07/06/2020 15:46    DG FEMUR MIN 2 VIEWS LEFT  Result Date: 07/06/2020 CLINICAL DATA:  Left hip and femur pain after a fall today. Initial encounter. EXAM: LEFT FEMUR 2 VIEWS COMPARISON:  None. FINDINGS: Left hip fracture is identified joint abnormality. No other acute bony or joint abnormality is seen. Soft tissues are negative. IMPRESSION: Acute left hip fracture. Please see report of dedicated plain films of the left hip today. Electronically Signed   By: Drusilla Kanner M.D.   On: 07/06/2020 15:45    Assessment/Plan: Left femoral neck fracture 1 Day Post-Op s/p Procedure(s): ARTHROPLASTY BIPOLAR HIP (HEMIARTHROPLASTY)  Weightbearing: WBAT LLE Insicional and dressing care: Reinforce dressings as needed Orthopedic device(s): patient no longer needs knee immobilizer. VTE prophylaxis: lovenox x 4 weeks Pain control: tylenol prn, tramadol prn q 6 hours Follow - up plan: 2 weeks with Dr. [name] Dispo: pending PT eval Contact information:   Weekdays 8-5 Janine Ores, PA-C [phone] A fter hours and holidays please check [email or web address] for group call information for [facility]  Armida Sans 07/08/2020, 8:19 AM

## 2020-07-08 NOTE — Evaluation (Signed)
Physical Therapy Evaluation Patient Details Name: [name] MRN: [id number] DOB: 06-14-1940 Today's Date: 07/08/2020   History of Present Illness  Pt is 80 yo male admitted s/p fall with L femoral neck fracture and is s/p posterior hemiarthroplasty on 07/07/20.  Pt has medical hx including but not limited to CAD, dementia, HTN  Clinical Impression  Pt admitted with above diagnosis. Pt has hx of dementia and 24 hr caregivers at home but is normally ambulatory without AD and does ADLs independently.  Pt very pleasant and able to follow commands.  He required mod A for bed mobility and sit/stand with couple steps/pivots to chair.  Had assist of 2 for safety.  Pt did have increase pain with transfers/movement and was premedicated, pain eased at rest and back pain improved in chair. Pt with good rehab potential. Pt currently with functional limitations due to the deficits listed below (see PT Problem List). Pt will benefit from skilled PT to increase their independence and safety with mobility to allow discharge to the venue listed below.       Follow Up Recommendations SNF    Equipment Recommendations  None recommended by PT    Recommendations for Other Services OT consult (independent ADLs baseline/ posterior precautions)     Precautions / Restrictions Precautions Precautions: Fall;Posterior Hip Precaution Booklet Issued: Yes (comment) Precaution Comments: Educated pt and daughter on precautions, provided handout for daughter and hung handouts on whiteboard Restrictions Weight Bearing Restrictions: No LLE Weight Bearing: Weight bearing as tolerated      Mobility  Bed Mobility Overal bed mobility: Needs Assistance Bed Mobility: Supine to Sit     Supine to sit: Mod assist;HOB elevated     General bed mobility comments: Increased time due to pain, cues for step by step sequence, assisted with L LE and to lift trunk, bed pad to facilitate    Transfers Overall transfer level:  Needs assistance Equipment used: Rolling walker (2 wheeled) Transfers: Sit to/from Stand Sit to Stand: Mod assist;+2 safety/equipment;From elevated surface         General transfer comment: Bed elevated and cued for hip precautions; pt wanting daughter to assist for safety but pt only required mod A to rise with pushing up through R LE and limited weight on L due to pain  Ambulation/Gait Ambulation/Gait assistance: Mod assist;+2 safety/equipment Gait Distance (Feet): 2 Feet Assistive device: Rolling walker (2 wheeled) Gait Pattern/deviations: Step-to pattern;Decreased stride length;Decreased weight shift to left Gait velocity: decreased   General Gait Details: Again pt wanting daughter present for safety but only needing mod A physical assist. He initially took a couple very small steps with cues for sequence and to push inwalker with L stance, but reports very painful.  Then completed transfer to chair by pivoting on R LE and PT assist with RW  Stairs            Wheelchair Mobility    Modified Rankin (Stroke Patients Only)       Balance Overall balance assessment: Needs assistance   Sitting balance-Leahy Scale: Fair Sitting balance - Comments: Using UE but more for pain control than balance   Standing balance support: Bilateral upper extremity supported Standing balance-Leahy Scale: Poor Standing balance comment: required RW                             Pertinent Vitals/Pain Pain Assessment: Faces Faces Pain Scale: Hurts whole lot (8/10 with movement, 2/10 rest) Pain  Location: low back and L hip Pain Descriptors / Indicators: Discomfort;Grimacing Pain Intervention(s): Limited activity within patient's tolerance;Monitored during session;Premedicated before session;Repositioned;Ice applied;Utilized relaxation techniques    Home Living Family/patient expects to be discharged to:: Skilled nursing facility Living Arrangements: Other (Comment);Alone (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) Available Help at Discharge: Personal care attendant;Available 24 hours/day Type of Home: House Home Access: Stairs to enter Entrance Stairs-Rails: Doctor, general practice of Steps: 2 Home Layout: One level Home Equipment: Grab bars - tub/shower;Bedside commode;Walker - 2 wheels;Walker - 4 wheels      Prior Function Level of Independence: Needs assistance   Gait / Transfers Assistance Needed: Ambulated independently; could do short distance ambulation  ADL's / Homemaking Assistance Needed: Pt independent with ADLs; Aides assisted with IADLs and safety        Hand Dominance        Extremity/Trunk Assessment   Upper Extremity Assessment Upper Extremity Assessment: Overall WFL for tasks assessed    Lower Extremity Assessment Lower Extremity Assessment: LLE deficits/detail;RLE deficits/detail;Difficult to assess due to impaired cognition RLE Deficits / Details: WFL LLE Deficits / Details: Expected post op changes, limited due to pain.  ROM: WFL within hip precautions; MMT: 5/5 ankle, 1/5 hip, 2/5 knee    Cervical / Trunk Assessment Cervical / Trunk Assessment: Normal  Communication   Communication: Expressive difficulties  Cognition Arousal/Alertness: Awake/alert Behavior During Therapy: WFL for tasks assessed/performed Overall Cognitive Status: History of cognitive impairments - at baseline                                 General Comments: Pt very pleasant and follows commands.  Oriented to self, place, situation.  No short term memory per daughter. Hx of dementia - reports near baseline, daughter pleased with how he is handling general anesthesia with demenita. Hx of dysarthria and needs increased time to respond.      General Comments      Exercises General Exercises - Lower Extremity Ankle Circles/Pumps: AROM;Both;5 reps;Supine Short Arc Quad: AROM;Both;5 reps;Supine   Assessment/Plan    PT Assessment Patient  needs continued PT services  PT Problem List Decreased strength;Decreased mobility;Decreased safety awareness;Decreased range of motion;Decreased coordination;Decreased knowledge of precautions;Decreased activity tolerance;Decreased cognition;Decreased balance;Decreased knowledge of use of DME;Pain       PT Treatment Interventions DME instruction;Therapeutic activities;Modalities;Gait training;Therapeutic exercise;Patient/family education;Balance training;Functional mobility training    PT Goals (Current goals can be found in the Care Plan section)  Acute Rehab PT Goals Patient Stated Goal: daughter reports rehab at d/c PT Goal Formulation: With patient Time For Goal Achievement: 07/22/20 Potential to Achieve Goals: Good    Frequency Min 3X/week   Barriers to discharge        Co-evaluation               AM-PAC PT "6 Clicks" Mobility  Outcome Measure Help needed turning from your back to your side while in a flat bed without using bedrails?: A Lot Help needed moving from lying on your back to sitting on the side of a flat bed without using bedrails?: A Lot Help needed moving to and from a bed to a chair (including a wheelchair)?: A Lot Help needed standing up from a chair using your arms (e.g., wheelchair or bedside chair)?: A Lot Help needed to walk in hospital room?: Total Help needed climbing 3-5 steps with a railing? : Total 6 Click Score: 10  End of Session Equipment Utilized During Treatment: Gait belt Activity Tolerance: Patient tolerated treatment well Patient left: with chair alarm set;in chair;with family/visitor present;with call bell/phone within reach Nurse Communication: Mobility status;Precautions;Weight bearing status (STEDY vs assist of 2 to pivot) PT Visit Diagnosis: Other abnormalities of gait and mobility (R26.89);Muscle weakness (generalized) (M62.81);History of falling (Z91.81)    Time: 1028-1100 PT Time Calculation (min) (ACUTE ONLY): 32  min   Charges:   PT Evaluation $PT Eval Moderate Complexity: 1 Mod PT Treatments $Therapeutic Activity: 8-22 mins        Anise Salvo, PT Acute Rehab Services Pager [phone] [facility] [phone]    Rayetta Humphrey 07/08/2020, 11:13 AM

## 2020-07-08 NOTE — TOC Initial Note (Addendum)
Transition of Care [facility]) - Initial/Assessment Note    Patient Details  Name: [name] MRN: [id number] Date of Birth: September 09, 1940  Transition of [facility]) CM/SW Contact:    Lennart Pall, LCSW Phone Number: 07/08/2020, 10:46 AM  Clinical Narrative:                 Met with pt and daughter this morning to introduce self/ TOC role with dc planning.  Pt very pleasant and with general awareness of his situation.  Daughter confirms that pt had been living at home with 24/7 caregiver PTA.  They are now agreed that pt will need SNF rehab and daughter requests facility closer to her home in [place]. Currently awaiting therapy evals and will then begin bed search and insurance authorization.  Pt is vaccinated x 2 and booster x 2.  Addendum 1515:  Have received SNF bed offer from [facility] in [place], [place] who can admit pt on Friday - pt and daughter have accepted.  Have begun ins auth (ref# [id number]).    Expected Discharge Plan: Skilled Nursing Facility Barriers to Discharge: Continued Medical Work up   Patient Goals and CMS Choice Patient states their goals for this hospitalization and ongoing recovery are:: rehab closer to his daughter      Expected Discharge Plan and Services Expected Discharge Plan: [place] In-house Referral: Clinical Social Work   Post Acute Care Choice: [place] Living arrangements for the past 2 months: Single Family Home                 DME Arranged: N/A DME Agency: NA                  Prior Living Arrangements/Services Living arrangements for the past 2 months: Single Family Home Lives with:: Other (Comment) (24/7 private caregiver) Patient language and need for interpreter reviewed:: Yes Do you feel safe going back to the place where you live?: Yes      Need for Family Participation in Patient Care: Yes (Comment) Care giver support system in place?: Yes (comment) Current home services: Homehealth  aide Criminal Activity/Legal Involvement Pertinent to Current Situation/Hospitalization: No - Comment as needed  Activities of Daily Living Home Assistive Devices/Equipment: Eyeglasses,Cane (specify quad or straight) ADL Screening (condition at time of admission) Patient's cognitive ability adequate to safely complete daily activities?: Yes Is the patient deaf or have difficulty hearing?: No Does the patient have difficulty seeing, even when wearing glasses/contacts?: Yes Does the patient have difficulty concentrating, remembering, or making decisions?: Yes Patient able to express need for assistance with ADLs?: Yes Does the patient have difficulty dressing or bathing?: No Independently performs ADLs?: No Communication: Independent Dressing (OT): Independent Grooming: Independent Feeding: Independent Bathing: Independent Toileting: Needs assistance Is this a change from baseline?: Pre-admission baseline In/Out Bed: Needs assistance Is this a change from baseline?: Pre-admission baseline Walks in Home: Needs assistance Is this a change from baseline?: Pre-admission baseline Does the patient have difficulty walking or climbing stairs?: Yes Weakness of Legs: Left Weakness of Arms/Hands: None  Permission Sought/Granted Permission sought to share information with : Family Supports Permission granted to share information with : Yes, Verbal Permission Granted  Share Information with NAME: Meryle Ready     Permission granted to share info w Relationship: daughter  Permission granted to share info w Contact Information: [phone]  Emotional Assessment Appearance:: Appears stated age Attitude/Demeanor/Rapport: Gracious Affect (typically observed): Accepting Orientation: : Oriented to Self,Oriented to Situation Alcohol /  Substance Use: Not Applicable Psych Involvement: No (comment)  Admission diagnosis:  Preop cardiovascular exam [Z01.810] Closed fracture of left hip, initial  encounter ([place]) [S72.002A] Closed left hip fracture, initial encounter [facility]) [S72.002A] Patient Active Problem List   Diagnosis Date Noted  . Closed left hip fracture, initial encounter ([place]) 07/06/2020  . Memory disorder 11/08/2018  . Dysarthria 04/25/2017  . Delayed gastric emptying 04/22/2016  . Respiratory failure ([place]) 04/16/2016  . SOB (shortness of breath)   . NSVT (nonsustained ventricular tachycardia) ([place])   . Elevated troponin 04/15/2016  . Acute on chronic diastolic heart failure ([place])   . Gastric dysmotility   . Gastrojejunostomy tube 02/06/2016 03/10/2016  . Hyponatremia   . Anxiety and depression   . Sleep disturbance   . Orthostasis   . Leukocytosis   . Hyperglycemia   . FTT (failure to thrive) in adult   . Anemia of chronic disease 01/31/2016  . Stage 2 skin ulcer of sacral region ([place]) 01/31/2016  . Hypomagnesemia 01/21/2016  . Hypokalemia 01/21/2016  . Pressure injury of skin 01/20/2016  . Pleural effusion   . Gastroesophageal reflux disease 01/19/2016  . Insomnia 01/19/2016  . Skin ulcer of abdominal wall, limited to breakdown of skin ([place]) 01/17/2016  . Dysphagia 01/17/2016  . Debility 01/17/2016  . 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 Vitamin B12 deficiency 01/02/2016  . BPH (benign prostatic hyperplasia)   . Posterior vitreous detachment   . Salzmann's nodular dystrophy of left eye   . Hyperopia of both eyes with astigmatism and presbyopia   . Central pterygium of right eye   . Pressure ulcer of buttock, right, unstageable ([place]) 12/30/2015  . Hypernatremia 12/28/2015  . Tobacco abuse 12/09/2015  . Gastritis 12/09/2015  . Respiratory failure, acute ([place])   . Protein-calorie malnutrition, severe 12/04/2015  . Encephalopathy acute 12/03/2015  . Anxiety state 09/28/2015  . Hyperlipidemia   . 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 Coronary atherosclerosis of native coronary artery 04/24/2013   . Essential hypertension, benign 04/24/2013  . Other and unspecified hyperlipidemia 04/24/2013  . Central pterygium 10/30/2012  . 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 Cataract, nuclear sclerotic senile 10/30/2012   PCP:  Lavone Orn, MD Pharmacy:   [place], [place] [address] Phone: [phone] Fax: [phone]     Social Determinants of Health (SDOH) Interventions    Readmission Risk Interventions No flowsheet data found.

## 2020-07-08 NOTE — NC FL2 (Signed)
[place] MEDICAID FL2 LEVEL OF CARE SCREENING TOOL     IDENTIFICATION  Patient Name: [name] Birthdate: 1940/12/20 Sex: male Admission Date (Current Location): 07/06/2020  [facility] and [place] Number:  Producer, television/film/video and Address:  [facility],  [address] [place], [place] [address]      Provider Number: [id number]  Attending Physician Name and Address:  Standley Brooking, MD  Relative Name and Phone Number:  daughter, Valentino Hue [phone]    Current Level of Care: Hospital Recommended Level of Care: Skilled Nursing Facility Prior Approval Number:    Date Approved/Denied:   PASRR Number: [id number]  Discharge Plan: SNF    Current Diagnoses: Patient Active Problem List   Diagnosis Date Noted  . Closed left hip fracture, initial encounter ([facility]) 07/06/2020  . Memory disorder 11/08/2018  . Dysarthria 04/25/2017  . Delayed gastric emptying 04/22/2016  . Respiratory failure ([facility]) 04/16/2016  . SOB (shortness of breath)   . NSVT (nonsustained ventricular tachycardia) ([facility])   . Elevated troponin 04/15/2016  . Acute on chronic diastolic heart failure (H[facility])   . Gastric dysmotility   . Gastrojejunostomy tube 02/06/2016 03/10/2016  . Hyponatremia   . Anxiety and depression   . Sleep disturbance   . Orthostasis   . Leukocytosis   . Hyperglycemia   . FTT (failure to thrive) in adult   . Anemia of chronic disease 01/31/2016  . Stage 2 skin ulcer of sacral region (H[facility]) 01/31/2016  . Hypomagnesemia 01/21/2016  . Hypokalemia 01/21/2016  . Pressure injury of skin 01/20/2016  . Pleural effusion   . Gastroesophageal reflux disease 01/19/2016  . Insomnia 01/19/2016  . Skin ulcer of abdominal wall, limited to breakdown of skin (H[facility]) 01/17/2016  . Dysphagia 01/17/2016  . Debility 01/17/2016  . 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 Vitamin B12 deficiency 01/02/2016  . BPH (benign prostatic hyperplasia)   . Posterior vitreous detachment    . Salzmann's nodular dystrophy of left eye   . Hyperopia of both eyes with astigmatism and presbyopia   . Central pterygium of right eye   . Pressure ulcer of buttock, right, unstageable (HCC) 12/30/2015  . Hypernatremia 12/28/2015  . Tobacco abuse 12/09/2015  . Gastritis 12/09/2015  . Respiratory failure, acute (HCC)   . Protein-calorie malnutrition, severe 12/04/2015  . Encephalopathy acute 12/03/2015  . Anxiety state 09/28/2015  . Hyperlipidemia   . 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 Coronary atherosclerosis of native coronary artery 04/24/2013  . Essential hypertension, benign 04/24/2013  . Other and unspecified hyperlipidemia 04/24/2013  . Central pterygium 10/30/2012  . 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 Cataract, nuclear sclerotic senile 10/30/2012    Orientation RESPIRATION BLADDER Height & Weight     Self,Situation,Place  Normal Incontinent,External catheter Weight: 169 lb 8.5 oz (76.9 kg) Height:  6' (182.9 cm)  BEHAVIORAL SYMPTOMS/MOOD NEUROLOGICAL BOWEL NUTRITION STATUS      Continent    AMBULATORY STATUS COMMUNICATION OF NEEDS Skin   Limited Assist Verbally Surgical wounds                       Personal Care Assistance Level of Assistance  Bathing,Dressing Bathing Assistance: Limited assistance   Dressing Assistance: Limited assistance     Functional Limitations Info             SPECIAL CARE FACTORS FREQUENCY  PT (By licensed PT),OT (By licensed OT)  PT Frequency: 5x/wk OT Frequency: 5x/wk            Contractures Contractures Info: Not present    Additional Factors Info  Code Status,Allergies,Psychotropic Code Status Info: DNR Allergies Info: see MAR Psychotropic Info: see MAR         Current Medications (07/08/2020):  This is the current hospital active medication list Current Facility-Administered Medications  Medication Dose Route  Frequency Provider Last Rate Last Admin  . 0.45 % NaCl with KCl 20 mEq / L infusion   Intravenous Continuous Armida Sans, PA-C 75 mL/hr at 07/08/20 0946 New Bag at 07/08/20 0946  . acetaminophen (TYLENOL) tablet 325-650 mg  325-650 mg Oral Q6H PRN Janine Ores K, PA-C      . acetaminophen (TYLENOL) tablet 500 mg  500 mg Oral Q6H Janine Ores K, PA-C   500 mg at 07/08/20 0555  . alum & mag hydroxide-simeth (MAALOX/MYLANTA) 200-200-20 MG/5ML suspension 30 mL  30 mL Oral Q4H PRN Janine Ores K, PA-C      . aspirin EC tablet 81 mg  81 mg Oral Daily Janine Ores K, PA-C   81 mg at 07/08/20 0948  . bisacodyl (DULCOLAX) suppository 10 mg  10 mg Rectal Daily PRN Janine Ores K, PA-C      . busPIRone (BUSPAR) tablet 5 mg  5 mg Oral BID Janine Ores K, PA-C   5 mg at 07/08/20 0948  . divalproex (DEPAKOTE) DR tablet 125 mg  125 mg Oral Q12H Janine Ores K, PA-C   125 mg at 07/08/20 4709  . docusate sodium (COLACE) capsule 100 mg  100 mg Oral BID Janine Ores K, PA-C   100 mg at 07/08/20 6283  . donepezil (ARICEPT) tablet 5 mg  5 mg Oral QHS Janine Ores K, PA-C   5 mg at 07/07/20 2235  . enoxaparin (LOVENOX) injection 40 mg  40 mg Subcutaneous Q24H Janine Ores K, PA-C   40 mg at 07/08/20 0815  . ferrous sulfate tablet 325 mg  325 mg Oral TID PC Brown, Blaine K, PA-C   325 mg at 07/08/20 6629  . magnesium citrate solution 1 Bottle  1 Bottle Oral Once PRN Janine Ores K, PA-C      . memantine [facility]) tablet 10 mg  10 mg Oral BID Janine Ores K, PA-C   10 mg at 07/08/20 0949  . menthol-cetylpyridinium (CEPACOL) lozenge 3 mg  1 lozenge Oral PRN Janine Ores K, PA-C       Or  . phenol (CHLORASEPTIC) mouth spray 1 spray  1 spray Mouth/Throat PRN Janine Ores K, PA-C   1 spray at 07/08/20 1005  . mupirocin ointment (BACTROBAN) 2 % 1 application  1 application Nasal BID Armida Sans, PA-C   1 application at 07/08/20 4765  . ondansetron (ZOFRAN) tablet 4 mg  4 mg Oral Q6H PRN Janine Ores K,  PA-C       Or  . ondansetron [facility]) injection 4 mg  4 mg Intravenous Q6H PRN Janine Ores K, PA-C      . pantoprazole (PROTONIX) EC tablet 40 mg  40 mg Oral Daily Janine Ores K, PA-C   40 mg at 07/08/20 0948  . polyethylene glycol (MIRALAX / GLYCOLAX) packet 17 g  17 g Oral Daily Janine Ores K, PA-C   17 g at 07/08/20 0948  . polyethylene glycol (MIRALAX / GLYCOLAX) packet 17 g  17 g Oral Daily PRN Janine Ores K, PA-C      .  pravastatin (PRAVACHOL) tablet 40 mg  40 mg Oral Daily Janine Ores K, PA-C   40 mg at 07/08/20 0949  . senna (SENOKOT) tablet 8.6 mg  1 tablet Oral BID Janine Ores K, PA-C   8.6 mg at 07/08/20 0948  . traMADol (ULTRAM) tablet 50 mg  50 mg Oral Q6H PRN Armida Sans, PA-C         Discharge Medications: Please see discharge summary for a list of discharge medications.  Relevant Imaging Results:  Relevant Lab Results:   Additional Information SSN: [id number]  Amada Jupiter, LCSW

## 2020-07-08 NOTE — Care Management Important Message (Signed)
Important Message  Patient Details IM Letter given to the Patient. Name: [name] MRN: [id number] Date of Birth: 1940-10-03   Medicare Important Message Given:  Yes     Caren Macadam 07/08/2020, 11:30 AM

## 2020-07-09 DIAGNOSIS — D539 Nutritional anemia, unspecified: Secondary | ICD-10-CM | POA: Diagnosis not present

## 2020-07-09 DIAGNOSIS — S72002A Fracture of unspecified part of neck of left femur, initial encounter for closed fracture: Secondary | ICD-10-CM | POA: Diagnosis not present

## 2020-07-09 DIAGNOSIS — N179 Acute kidney failure, unspecified: Secondary | ICD-10-CM | POA: Diagnosis not present

## 2020-07-09 LAB — CBC
HCT: 27.6 % — ABNORMAL LOW (ref 39.0–52.0)
HCT: 29.9 % — ABNORMAL LOW (ref 39.0–52.0)
Hemoglobin: 9 g/dL — ABNORMAL LOW (ref 13.0–17.0)
Hemoglobin: 9.7 g/dL — ABNORMAL LOW (ref 13.0–17.0)
MCH: 33.2 pg (ref 26.0–34.0)
MCH: 32.9 pg (ref 26.0–34.0)
MCHC: 32.6 g/dL (ref 30.0–36.0)
MCHC: 32.4 g/dL (ref 30.0–36.0)
MCV: 101.8 fL — ABNORMAL HIGH (ref 80.0–100.0)
MCV: 101.4 fL — ABNORMAL HIGH (ref 80.0–100.0)
Platelets: 123 10*3/uL — ABNORMAL LOW (ref 150–400)
Platelets: 136 10*3/uL — ABNORMAL LOW (ref 150–400)
RBC: 2.71 MIL/uL — ABNORMAL LOW (ref 4.22–5.81)
RBC: 2.95 MIL/uL — ABNORMAL LOW (ref 4.22–5.81)
RDW: 12.2 % (ref 11.5–15.5)
RDW: 12.2 % (ref 11.5–15.5)
WBC: 8.2 10*3/uL (ref 4.0–10.5)
WBC: 8.6 10*3/uL (ref 4.0–10.5)
nRBC: 0 % (ref 0.0–0.2)
nRBC: 0 % (ref 0.0–0.2)

## 2020-07-09 LAB — SARS CORONAVIRUS 2 (TAT 6-24 HRS): SARS Coronavirus 2: NEGATIVE

## 2020-07-09 MED ORDER — TRAMADOL HCL 50 MG PO TABS: 50.0000 mg | ORAL_TABLET | Freq: Four times a day (QID) | ORAL | 0 refills | Status: DC | PRN

## 2020-07-09 MED ORDER — POLYETHYLENE GLYCOL 3350 17 G PO PACK: 17.0000 g | PACK | Freq: Every day | ORAL | Status: DC

## 2020-07-09 MED ORDER — SENNA 8.6 MG PO TABS: 1.0000 | ORAL_TABLET | Freq: Every day | ORAL | Status: DC

## 2020-07-09 MED ORDER — ENOXAPARIN SODIUM 40 MG/0.4ML IJ SOSY: 40.0000 mg | PREFILLED_SYRINGE | INTRAMUSCULAR | 0 refills | Status: DC

## 2020-07-09 MED ORDER — ASPIRIN 81 MG PO TBEC: 81.0000 mg | DELAYED_RELEASE_TABLET | Freq: Every day | ORAL | Status: DC

## 2020-07-09 MED ORDER — ENOXAPARIN SODIUM 40 MG/0.4ML IJ SOSY: 40.0000 mg | PREFILLED_SYRINGE | INTRAMUSCULAR | Status: DC

## 2020-07-09 NOTE — TOC Progression Note (Signed)
Transition of Care [facility]) - Progression Note    Patient Details  Name: [name] MRN: [id number] Date of Birth: 03-30-1940  Transition of Care [facility]) CM/SW Contact  Amada Jupiter, LCSW Phone Number: 07/09/2020, 2:00 PM  Clinical Narrative:    MD anticipates pt will be medically ready to admit to [facility] (SNF) in [place], [place] tomorrow.  Therapies are strongly recommending stretcher transport and have been able to secure [employer] [phone]) and daughter in agreement.  Daughter has secured the transport with credit card hold and pick up is planned for 9am tomorrow.  MD and RN aware of early dc.     Expected Discharge Plan: Skilled Nursing Facility Barriers to Discharge: Continued Medical Work up  Expected Discharge Plan and Services Expected Discharge Plan: Skilled Nursing Facility In-house Referral: Clinical Social Work   Post Acute Care Choice: Skilled Nursing Facility Living arrangements for the past 2 months: Single Family Home                 DME Arranged: N/A DME Agency: NA                   Social Determinants of Health (SDOH) Interventions    Readmission Risk Interventions No flowsheet data found.

## 2020-07-09 NOTE — Evaluation (Signed)
Occupational Therapy Evaluation Patient Details Name: [name] MRN: [id number] DOB: Sep 04, 1940 Today's Date: 07/09/2020    History of Present Illness Pt is 80 yo male admitted s/p fall with L femoral neck fracture and is s/p posterior hemiarthroplasty on 07/07/20.  Pt has medical hx including but not limited to CAD, dementia, HTN   Clinical Impression   [name] is an 80 year old man s/p hip surgery who presents with decreased ROM and strength of LLE, pain, poor activity tolerance, impaired balance and dementia resulting in a sudden and significant decline in ability to perform transfers, ambulation and perform ADLs. Patient typically able to ambulate with AE and independent with ADLs. Patient required max assist for transfer out of bed, mod x 2 to stand from elevated bed height and min assist x 2 to transfer to recliner. Patient able to brush teeth at side of bed but total assist for LB dressing and toileting. Patient will benefit from skilled OT services while in hospital to improve deficits and learn compensatory strategies as needed in order to return to PLOF.      Follow Up Recommendations  SNF    Equipment Recommendations  None recommended by OT    Recommendations for Other Services       Precautions / Restrictions Precautions Precautions: Fall;Posterior Hip Precaution Comments: Educated pt and daughter on precautions, provided handout for daughter and hung handouts on whiteboard Restrictions Weight Bearing Restrictions: No LLE Weight Bearing: Weight bearing as tolerated      Mobility Bed Mobility Overal bed mobility: Needs Assistance Bed Mobility: Supine to Sit     Supine to sit: Max assist;HOB elevated     General bed mobility comments: Max assist for transfer to side of bed - needing assistance for both LEs and trunk negotiattion, use of bed pad to pivot hips. Patient limited by pain.    Transfers Overall transfer level: Needs assistance Equipment  used: Rolling walker (2 wheeled) Transfers: Sit to/from [employer] Sit to Stand: +2 safety/equipment;From elevated surface;+2 physical assistance;Mod assist Stand pivot transfers: +2 physical assistance;+2 safety/equipment;Min assist       General transfer comment: Needed significanted elevation of bed to stand. Mod x 2 to stand. Patient min x 2 to slide feet to transfer to recliner. Patient demonstrated good upper body strength on wallker but not able to lift legs for an actual steps.    Balance Overall balance assessment: Needs assistance Sitting-balance support: Feet supported Sitting balance-Leahy Scale: Fair Sitting balance - Comments: Using UE but more for pain control than balance   Standing balance support: Bilateral upper extremity supported Standing balance-Leahy Scale: Poor                             ADL either performed or assessed with clinical judgement   ADL Overall ADL's : Needs assistance/impaired Eating/Feeding: Independent   Grooming: Set up;Sitting   Upper Body Bathing: Sitting;Set up;Cueing for sequencing   Lower Body Bathing: Total assistance;Sit to/from stand;+2 for safety/equipment   Upper Body Dressing : Set up;Sitting   Lower Body Dressing: Total assistance;+2 for safety/equipment;Sit to/from stand   Toilet Transfer: RW;BSC;Stand-pivot;+2 for safety/equipment;+2 for physical assistance;Moderate assistance   Toileting- Clothing Manipulation and Hygiene: Total assistance;Sit to/from stand               Vision Patient Visual Report: No change from baseline       Perception     Praxis  Pertinent Vitals/Pain Pain Assessment: 0-10 Pain Score: 9  Pain Location: Left hip Pain Descriptors / Indicators: Discomfort;Grimacing;Guarding;Moaning Pain Intervention(s): Monitored during session;RN gave pain meds during session     Hand Dominance     Extremity/Trunk Assessment Upper Extremity Assessment Upper  Extremity Assessment: Overall WFL for tasks assessed   Lower Extremity Assessment Lower Extremity Assessment: Defer to PT evaluation   Cervical / Trunk Assessment Cervical / Trunk Assessment: Normal   Communication Communication Communication: Expressive difficulties   Cognition Arousal/Alertness: Awake/alert Behavior During Therapy: WFL for tasks assessed/performed Overall Cognitive Status: History of cognitive impairments - at baseline                                 General Comments: Pt very pleasant and follows commands.  Oriented to self and situation.  No short term memory per daughter. Hx of dementia - reports near baseline, daughter pleased with how he is handling general anesthesia with demenita. Hx of dysarthria and needs increased time to respond.   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) Available Help at Discharge: Personal care attendant;Available 24 hours/day Type of Home: House Home Access: Stairs to enter [employer] of Steps: 2 Entrance Stairs-Rails: Right;Left Home Layout: One level     Bathroom Shower/Tub: Producer, television/film/video: Handicapped height     Home Equipment: Grab bars - tub/shower;Bedside commode;Walker - 2 wheels;Walker - 4 wheels          Prior Functioning/Environment Level of Independence: Needs assistance  Gait / Transfers Assistance Needed: Ambulated independently; could do short distance ambulation ADL's / Homemaking Assistance Needed: Pt independent with ADLs; Aides assisted with IADLs and safety            OT Problem List: Decreased strength;Decreased range of motion;Decreased activity tolerance;Impaired balance (sitting and/or standing);Decreased cognition;Decreased knowledge of use of DME or  AE;Pain;Decreased safety awareness      OT Treatment/Interventions: Self-care/ADL training;Therapeutic exercise;DME and/or AE instruction;Therapeutic activities;Balance training;Patient/family education    OT Goals(Current goals can be found in the care plan section) Acute Rehab OT Goals OT Goal Formulation: Patient unable to participate in goal setting Time For Goal Achievement: 07/23/20 Potential to Achieve Goals: Good  OT Frequency: Min 2X/week   Barriers to D/C:            Co-evaluation              AM-PAC OT "6 Clicks" Daily Activity     Outcome Measure Help from another person eating meals?: None Help from another person taking care of personal grooming?: A Little Help from another person toileting, which includes using toliet, bedpan, or urinal?: Total Help from another person bathing (including washing, rinsing, drying)?: A Lot Help from another person to put on and taking off regular upper body clothing?: A Little Help from another person to put on and taking off regular lower body clothing?: Total 6 Click Score: 14   End of Session Equipment Utilized During Treatment: Rolling walker;Gait belt Nurse Communication: Patient requests pain meds  Activity Tolerance: Patient limited by pain Patient left: in chair;with call bell/phone within reach  OT Visit Diagnosis: Unsteadiness on feet (R26.81);Other abnormalities of gait and mobility (R26.89);Muscle weakness (generalized) (M62.81);Pain  Time: 3154-0086 OT Time Calculation (min): 20 min Charges:  OT General Charges $OT Visit: 1 Visit OT Evaluation $OT Eval Moderate Complexity: 1 Mod  Shammond Arave, OTR/L Acute Care Rehab Services  Office [phone] Pager: [phone]   Kelli Churn 07/09/2020, 10:51 AM

## 2020-07-09 NOTE — Progress Notes (Signed)
     Subjective: 2 Days Post-Op s/p Procedure(s): ARTHROPLASTY BIPOLAR HIP (HEMIARTHROPLASTY)   Patient is alert, pleasantly confused. Able to answer all questions. Denies pain this morning.  Denies chest pain, SOB, Calf pain. No nausea/vomiting. No other complaints.    Objective:  PE: VITALS:   Vitals:   07/08/20 0922 07/08/20 1347 07/08/20 2008 07/09/20 0507  BP: (!) 147/46 (!) 147/67 (!) 124/56 123/60  Pulse: 81 64 72 71  Resp: 18 18 18 16   Temp: 99.8 F (37.7 C) 98.4 F (36.9 C) 98.2 F (36.8 C) 97.9 F (36.6 C)  TempSrc: Oral Oral Oral Oral  SpO2: 93% 99% 93% 94%  Weight:      Height:        ABD soft Sensation intact distally Intact pulses distally Dorsiflexion/Plantar flexion intact Incision: no drainage  LABS  Results for orders placed or performed during the hospital encounter of 07/06/20 (from the past 24 hour(s))  CBC     Status: Abnormal   Collection Time: 07/09/20  2:58 AM  Result Value Ref Range   WBC 8.2 4.0 - 10.5 K/uL   RBC 2.71 (L) 4.22 - 5.81 MIL/uL   Hemoglobin 9.0 (L) 13.0 - 17.0 g/dL   HCT 07/11/20 (L) 16.1 - 09.6 %   MCV 101.8 (H) 80.0 - 100.0 fL   MCH 33.2 26.0 - 34.0 pg   MCHC 32.6 30.0 - 36.0 g/dL   RDW 04.5 40.9 - 81.1 %   Platelets 123 (L) 150 - 400 K/uL   nRBC 0.0 0.0 - 0.2 %    Pelvis Portable  Result Date: 07/07/2020 CLINICAL DATA:  80 year old male status post total left hip arthroplasty. EXAM: DG HIP (WITH OR WITHOUT PELVIS) 1V PORT LEFT; PORTABLE PELVIS 1-2 VIEWS COMPARISON:  Left hip radiograph dated 07/06/2020. FINDINGS: There is a total left hip arthroplasty. The arthroplasty components appear intact and in anatomic alignment. There is no acute fracture or dislocation. The bones are osteopenic. Postsurgical changes of the soft tissues of the left hip. IMPRESSION: Total left hip arthroplasty. No acute fracture or dislocation. Electronically Signed   By: 07/08/2020 M.D.   On: 07/07/2020 17:38   DG Hip Port Unilat With  Pelvis 1V Left  Result Date: 07/07/2020 CLINICAL DATA:  80 year old male status post total left hip arthroplasty. EXAM: DG HIP (WITH OR WITHOUT PELVIS) 1V PORT LEFT; PORTABLE PELVIS 1-2 VIEWS COMPARISON:  Left hip radiograph dated 07/06/2020. FINDINGS: There is a total left hip arthroplasty. The arthroplasty components appear intact and in anatomic alignment. There is no acute fracture or dislocation. The bones are osteopenic. Postsurgical changes of the soft tissues of the left hip. IMPRESSION: Total left hip arthroplasty. No acute fracture or dislocation. Electronically Signed   By: 07/08/2020 M.D.   On: 07/07/2020 17:38    Assessment/Plan: Left femoral neck fracture 2 Days Post-Op s/p Procedure(s): ARTHROPLASTY BIPOLAR HIP (HEMIARTHROPLASTY)  Weightbearing: WBAT LLE Insicional and dressing care: Reinforce dressings as needed VTE prophylaxis: lovenox x 4 weeks Pain control: tylenol prn, tramadol prn q 6 hours Follow - up plan: 2 weeks with Dr. 07/09/2020 Dispo: ok to discharge from ortho standpoint, PT recommending SNF  Contact information:   Weekdays 8-5 10-18-2005, Janine Ores [place] A fter hours and holidays please check [email or web address] for group call information for [facility]  [phone] 07/09/2020, 7:11 AM

## 2020-07-09 NOTE — Plan of Care (Signed)

## 2020-07-09 NOTE — Progress Notes (Signed)
Physical Therapy Treatment Patient Details Name: [name] MRN: [id number] DOB: 1940/07/25 Today's Date: 07/09/2020    History of Present Illness Pt is 80 yo male admitted s/p fall with L femoral neck fracture and is s/p posterior hemiarthroplasty on 07/07/20.  Pt has medical hx including but not limited to CAD, dementia, HTN    PT Comments    Pt with slow progress today - as sometimes occurs on POD #2.  Required mod A of 2 for transfers and with increased pain.  Pt was not able to take any steps and had difficulty with pivot.  Max cues for sequencing required, but pt is motivated and tried all exercises and transfer despite pain.  He was premedicated prior to session.  Continue plan of care.  Do recommend EMS transport due to mod A x 2 pivot with difficulty and posterior hip precautions.     Follow Up Recommendations  SNF (EMS transport)     Equipment Recommendations  None recommended by PT    Recommendations for Other Services       Precautions / Restrictions Precautions Precautions: Fall;Posterior Hip Precaution Booklet Issued: Yes (comment) Precaution Comments: Educated pt and daughter on precautions, provided handout for daughter and hung handouts on whiteboard Restrictions LLE Weight Bearing: Weight bearing as tolerated    Mobility  Bed Mobility Overal bed mobility: Needs Assistance Bed Mobility: Sit to Supine;Rolling Rolling: Min assist     Sit to supine: Max assist;+2 for physical assistance   General bed mobility comments: Helicopter technique back to supine for pain control. Min A with rolling for L LE.    Transfers Overall transfer level: Needs assistance Equipment used: Rolling walker (2 wheeled) Transfers: Sit to/from [employer] Sit to Stand: +2 physical assistance;Mod assist Stand pivot transfers: Mod assist;+2 physical assistance       General transfer comment: Pt performed sit to stand x 2 requiring cues for initiation, use of  momentum, and mod A x 2 to rise from low chair.  Assist to maintain hip precautions.  Ambulation/Gait Ambulation/Gait assistance: Mod assist;+2 safety/equipment Gait Distance (Feet): 1 Feet Assistive device: Rolling walker (2 wheeled) Gait Pattern/deviations: Step-to pattern;Decreased stride length;Decreased weight shift to left Gait velocity: decreased   General Gait Details: Pt was not able to take any steps today and only pivoted back toward bed.  Max cues for pivoting on R LE , posture, and use of RW.  Pt had difficulty completing pivot back toward L side requiring mod A for hip guidance to complete pivot when sitting   Stairs             Wheelchair Mobility    Modified Rankin (Stroke Patients Only)       Balance Overall balance assessment: Needs assistance Sitting-balance support: Feet supported Sitting balance-Leahy Scale: Fair Sitting balance - Comments: Using UE but more for pain control than balance   Standing balance support: Bilateral upper extremity supported Standing balance-Leahy Scale: Poor Standing balance comment: required RW                            Cognition Arousal/Alertness: Awake/alert Behavior During Therapy: WFL for tasks assessed/performed Overall Cognitive Status: History of cognitive impairments - at baseline                                 General Comments: Pt very pleasant and follows commands.  Oriented to self and situation.  No short term memory per daughter. Hx of dementia - reports near baseline, daughter pleased with how he is handling general anesthesia with demenita. Hx of dysarthria and needs increased time to respond.      Exercises General Exercises - Lower Extremity Ankle Circles/Pumps: AROM;Both;Supine;10 reps Quad Sets: AROM;Both;10 reps;Supine Long Arc Quad: AROM;Right;AAROM;Left;10 reps;Seated Heel Slides: AAROM;Both;10 reps;Supine;Limitations Heel Slides Limitations: very limited ROM on L due  to pain and precautions Hip ABduction/ADduction: AAROM;Both;10 reps;Supine;Limitations Hip Abduction/Adduction Limitations: very limited ROM on L due to pain and precautions    General Comments General comments (skin integrity, edema, etc.): Spoke with daughter about car transfer vs EMS transport.  Plan is to go to facility near [place] (near daughter) and EMS transport was going to be expensive so considered car transfer.  However, with pain and slower progress today still recommend EMS transport.  Daughter is in agreement.      Pertinent Vitals/Pain Pain Assessment: Faces Faces Pain Scale: Hurts whole lot (8/10 movement, 2/10 rest) Pain Location: Left hip - with transfers Pain Descriptors / Indicators: Discomfort;Grimacing;Guarding;Moaning Pain Intervention(s): Limited activity within patient's tolerance;Monitored during session;Premedicated before session;Repositioned;Ice applied    Home Living                      Prior Function            PT Goals (current goals can now be found in the care plan section) Acute Rehab PT Goals Patient Stated Goal: daughter reports rehab at d/c PT Goal Formulation: With patient Time For Goal Achievement: 07/22/20 Potential to Achieve Goals: Good Progress towards PT goals: Progressing toward goals    Frequency    Min 3X/week      PT Plan Current plan remains appropriate    Co-evaluation              AM-PAC PT "6 Clicks" Mobility   Outcome Measure  Help needed turning from your back to your side while in a flat bed without using bedrails?: A Lot Help needed moving from lying on your back to sitting on the side of a flat bed without using bedrails?: A Lot Help needed moving to and from a bed to a chair (including a wheelchair)?: Total Help needed standing up from a chair using your arms (e.g., wheelchair or bedside chair)?: Total Help needed to walk in hospital room?: Total Help needed climbing 3-5 steps with a  railing? : Total 6 Click Score: 8    End of Session Equipment Utilized During Treatment: Gait belt Activity Tolerance: Patient tolerated treatment well Patient left: with family/visitor present;with call bell/phone within reach;in bed;with bed alarm set Nurse Communication: Mobility status;Precautions;Weight bearing status;Need for lift equipment (white board communication for use of STEDY) PT Visit Diagnosis: Other abnormalities of gait and mobility (R26.89);Muscle weakness (generalized) (M62.81);History of falling (Z91.81)     Time: 9373-4287 PT Time Calculation (min) (ACUTE ONLY): 35 min  Charges:  $Therapeutic Exercise: 8-22 mins $Therapeutic Activity: 8-22 mins                     Anise Salvo, PT Acute Rehab Services Pager [phone] [facility] [phone]     Rayetta Humphrey 07/09/2020, 4:44 PM

## 2020-07-09 NOTE — Discharge Summary (Addendum)
Physician Discharge Summary  [name]:[id number] DOB: 1940-03-07 DOA: 07/06/2020  PCP: [name], MD  Admit date: 07/06/2020 Discharge date: 07/10/2020  Recommendations for Outpatient Follow-up:  1. Follow-up hip fracture 2. Consider outpatient anemia panel. 3. Follow-up possible chronic kidney disease   Follow-up Information    Danny Lucy, MD. Schedule an appointment as soon as possible for a visit in 2 weeks.   Specialty: Orthopedic Surgery Contact information: [address] [place] [address] [phone]              Discharge Diagnoses: Principal diagnosis is #1 Principal Problem:   Closed left hip fracture, initial encounter [facility]) Active Problems:   AKI (acute kidney injury) (HCC)   Thrombocytopenia (HCC)   Macrocytic anemia   Dementia (HCC)   Discharge Condition: improved Disposition: short-term SNF  Diet recommendation:  Diet Orders (From admission, onward)    Start     Ordered   07/07/20 1749  Diet regular Room service appropriate? Yes; Fluid consistency: Thin  Diet effective now       Question Answer Comment  Room service appropriate? Yes   Fluid consistency: Thin      07/07/20 1749           Filed Weights   07/07/20 0500 07/08/20 0500 07/10/20 0500  Weight: 76.7 kg 76.9 kg 77.4 kg    HPI/Hospital Course:   80 year old male PMH dementia presented after a fall at home resulting in left femoral neck fracture.  Underwent operative intervention with apparent complication.  Postoperative course unremarkable.  SNF recommended and bed secured.    Acute left femoral neck fracture secondary to mechanical fall --Status post left hip hemiarthroplasty 5/24 . Weightbearing: WBAT LLE . Insicional and dressing care: Reinforce dressings as needed . Orthopedic device(s): patient no longer needs knee immobilizer. . VTE prophylaxis: lovenox x 4 weeks . Pain control: tylenol prn, tramadol prn q 6 hours . Follow - up  plan: 2 weeks with Dr. [name]   Acute kidney injury versus CKD Stage II or III cannot determine further. -- Creatinine stable.  Can follow-up in the outpatient setting.  Mild thrombocytopenia and macrocytic anemia -- Hemoglobin stable platelets trending up.  Consider outpatient anemia panel.  Dementia, anxiety --At baseline per daughter --Continue Aricept, Namenda, BuSpar, Depakote  CAD --Quiescent.  Continue aspirin and pravastatin.    Significant Hospital Events    523 admit for hip fx  Consults:   orthopedics  Procedures:   Cemented left hip hemiarthroplasty  5/26 assessment: S: CC: f/u hip fx  Feels good, pain controlled.  O: Vitals:  Vitals:   07/09/20 2206 07/10/20 0518  BP: (!) 128/52 130/64  Pulse: 74 67  Resp: 18 17  Temp: 98.7 F (37.1 C) 98.9 F (37.2 C)  SpO2: 93% 91%    Constitutional:  . Appears calm and comfortable ENMT:  . grossly normal hearing  Respiratory:  . CTA bilaterally, no w/r/r.  . Respiratory effort normal.  Cardiovascular:  . RRR, no m/r/g . No LE extremity edema   Musculoskeletal:  . Moves both legs to command Psychiatric:  . Mental status o Mood, affect appropriate  Hgb stable 9.7 stable Plts up to 136  Discharge Instructions   Allergies as of 07/10/2020      Reactions   Flomax [tamsulosin Hcl] Other (See Comments)   Leg weakness   Lisinopril Cough   Lorazepam Other (See Comments)   "i don't like it"  Prefers Xanax or valium  Uroxatral [alfuzosin Hcl Er] Other (See Comments)   Leg weakness      Medication List    STOP taking these medications   aspirin 81 MG tablet Replaced by: aspirin 81 MG EC tablet     TAKE these medications   aspirin 81 MG EC tablet Take 1 tablet (81 mg total) by mouth daily. Swallow whole. Replaces: aspirin 81 MG tablet   busPIRone 5 MG tablet Commonly known as: BUSPAR Take 1 tablet (5 mg total) by mouth 2 (two) times daily.   cholestyramine 4 GM/DOSE powder Commonly  known as: QUESTRAN Take 1 g by mouth daily.   divalproex 125 MG DR tablet Commonly known as: DEPAKOTE TAKE (2) TABLETS TWICE DAILY. What changed: See the new instructions.   donepezil 5 MG tablet Commonly known as: ARICEPT Take 1 tablet (5 mg total) by mouth at bedtime.   enoxaparin 40 MG/0.4ML injection Commonly known as: LOVENOX Inject 0.4 mLs (40 mg total) into the skin daily. Total 4 weeks, finish 6/21.   memantine 10 MG tablet Commonly known as: NAMENDA TAKE 1 TABLET BY MOUTH TWICE DAILY.   Multi-Vitamins Tabs Take 1 tablet by mouth daily.   nitroGLYCERIN 0.4 MG SL tablet Commonly known as: NITROSTAT Place 1 tablet (0.4 mg total) under the tongue every 5 (five) minutes as needed for chest pain.   pantoprazole 40 MG tablet Commonly known as: PROTONIX Take 40 mg by mouth daily.   polyethylene glycol 17 g packet Commonly known as: MIRALAX / GLYCOLAX Take 17 g by mouth daily.   pravastatin 40 MG tablet Commonly known as: PRAVACHOL Take 40 mg by mouth daily.   senna 8.6 MG Tabs tablet Commonly known as: SENOKOT Take 1 tablet (8.6 mg total) by mouth at bedtime.   traMADol 50 MG tablet Commonly known as: Ultram Take 1 tablet (50 mg total) by mouth every 6 (six) hours as needed.      Allergies  Allergen Reactions  . Flomax [Tamsulosin Hcl] Other (See Comments)    Leg weakness  . Lisinopril Cough  . Lorazepam Other (See Comments)    "i don't like it"  Prefers Xanax or valium  . Uroxatral [Alfuzosin Hcl Er] Other (See Comments)    Leg weakness    The results of significant diagnostics from this hospitalization (including imaging, microbiology, ancillary and laboratory) are listed below for reference.    Significant Diagnostic Studies: CT Head Wo Contrast  Result Date: 07/06/2020 CLINICAL DATA:  Fall EXAM: CT HEAD WITHOUT CONTRAST TECHNIQUE: Contiguous axial images were obtained from the base of the skull through the vertex without intravenous contrast.  COMPARISON:  None. FINDINGS: Brain: There is no acute intracranial hemorrhage, mass effect, or edema. Gray-white differentiation is preserved. There is no extra-axial fluid collection. Prominence of the ventricles and sulci reflects generalized parenchymal volume loss. Patchy hypoattenuation in the supratentorial white matter is nonspecific but probably reflects moderate chronic microvascular ischemic changes. Vascular: There is atherosclerotic calcification at the skull base. Skull: Calvarium is unremarkable. Sinuses/Orbits: No acute finding. Other: None. IMPRESSION: No evidence of acute intracranial injury. Electronically Signed   By: Guadlupe SpanishPraneil  [name] M.D.   On: 07/06/2020 15:47   CT Cervical Spine Wo Contrast  Result Date: 07/06/2020 CLINICAL DATA:  Fall EXAM: CT CERVICAL SPINE WITHOUT CONTRAST TECHNIQUE: Multidetector CT imaging of the cervical spine was performed without intravenous contrast. Multiplanar CT image reconstructions were also generated. COMPARISON:  None. FINDINGS: Alignment: No significant anteroposterior listhesis. Skull base and vertebrae: No acute cervical spine fracture.  Vertebral body heights are maintained. Soft tissues and spinal canal: No prevertebral fluid or swelling. No visible canal hematoma. Disc levels: Multilevel degenerative changes are present including disc space narrowing, endplate osteophytes, and facet and uncovertebral hypertrophy. Upper chest: No apical lung mass. Other: None. IMPRESSION: No acute cervical spine fracture. Electronically Signed   By: Guadlupe Spanish M.D.   On: 07/06/2020 15:50   Pelvis Portable  Result Date: 07/07/2020 CLINICAL DATA:  80 year old male status post total left hip arthroplasty. EXAM: DG HIP (WITH OR WITHOUT PELVIS) 1V PORT LEFT; PORTABLE PELVIS 1-2 VIEWS COMPARISON:  Left hip radiograph dated 07/06/2020. FINDINGS: There is a total left hip arthroplasty. The arthroplasty components appear intact and in anatomic alignment. There is no acute  fracture or dislocation. The bones are osteopenic. Postsurgical changes of the soft tissues of the left hip. IMPRESSION: Total left hip arthroplasty. No acute fracture or dislocation. Electronically Signed   By: Elgie Collard M.D.   On: 07/07/2020 17:38   DG CHEST PORT 1 VIEW  Result Date: 07/06/2020 CLINICAL DATA:  80 year old male with fall. EXAM: PORTABLE CHEST 1 VIEW COMPARISON:  Chest radiograph dated 12/01/2017 FINDINGS: No focal consolidation, pleural effusion or pneumothorax. The cardiac silhouette is within limits. Atherosclerotic calcification of the aorta. No acute osseous pathology. IMPRESSION: No active disease. Electronically Signed   By: Elgie Collard M.D.   On: 07/06/2020 17:55   DG Hip Port Unilat With Pelvis 1V Left  Result Date: 07/07/2020 CLINICAL DATA:  80 year old male status post total left hip arthroplasty. EXAM: DG HIP (WITH OR WITHOUT PELVIS) 1V PORT LEFT; PORTABLE PELVIS 1-2 VIEWS COMPARISON:  Left hip radiograph dated 07/06/2020. FINDINGS: There is a total left hip arthroplasty. The arthroplasty components appear intact and in anatomic alignment. There is no acute fracture or dislocation. The bones are osteopenic. Postsurgical changes of the soft tissues of the left hip. IMPRESSION: Total left hip arthroplasty. No acute fracture or dislocation. Electronically Signed   By: Elgie Collard M.D.   On: 07/07/2020 17:38   DG Hip Unilat With Pelvis 2-3 Views Left  Result Date: 07/06/2020 CLINICAL DATA:  Left hip and femur pain after a fall today. Initial encounter. EXAM: DG HIP (WITH OR WITHOUT PELVIS) 2-3V LEFT COMPARISON:  None. FINDINGS: The patient has an acute subcapital fracture of the left hip. Nondisplaced component of the fracture extends more inferiorly into the femoral neck. The fracture is minimally impacted. No other acute abnormality is seen. IMPRESSION: Acute left femoral neck fracture. Electronically Signed   By: Drusilla Kanner M.D.   On: 07/06/2020 15:46    DG FEMUR MIN 2 VIEWS LEFT  Result Date: 07/06/2020 CLINICAL DATA:  Left hip and femur pain after a fall today. Initial encounter. EXAM: LEFT FEMUR 2 VIEWS COMPARISON:  None. FINDINGS: Left hip fracture is identified joint abnormality. No other acute bony or joint abnormality is seen. Soft tissues are negative. IMPRESSION: Acute left hip fracture. Please see report of dedicated plain films of the left hip today. Electronically Signed   By: Drusilla Kanner M.D.   On: 07/06/2020 15:45    Microbiology: Recent Results (from the past 240 hour(s))  Resp Panel by RT-PCR (Flu A&B, Covid) Nasopharyngeal Swab     Status: None   Collection Time: 07/06/20  2:06 PM   Specimen: Nasopharyngeal Swab; Nasopharyngeal(NP) swabs in vial transport medium  Result Value Ref Range Status   SARS Coronavirus 2 by RT PCR NEGATIVE NEGATIVE Final    Comment: (NOTE) SARS-CoV-2 target nucleic acids  are NOT DETECTED.  The SARS-CoV-2 RNA is generally detectable in upper respiratory specimens during the acute phase of infection. The lowest concentration of SARS-CoV-2 viral copies this assay can detect is 138 copies/mL. A negative result does not preclude SARS-Cov-2 infection and should not be used as the sole basis for treatment or other patient management decisions. A negative result may occur with  improper specimen collection/handling, submission of specimen other than nasopharyngeal swab, presence of viral mutation(s) within the areas targeted by this assay, and inadequate number of viral copies(<138 copies/mL). A negative result must be combined with clinical observations, patient history, and epidemiological information. The expected result is Negative.  Fact Sheet for Patients:  BloggerCourse.com  Fact Sheet for Healthcare Providers:  SeriousBroker.it  This test is no t yet approved or cleared by the [place] FDA and  has been authorized for detection  and/or diagnosis of SARS-CoV-2 by FDA under an Emergency Use Authorization (EUA). This EUA will remain  in effect (meaning this test can be used) for the duration of the COVID-19 declaration under Section 564(b)(1) of the Act, 21 U.S.C.section 360bbb-3(b)(1), unless the authorization is terminated  or revoked sooner.       Influenza A by PCR NEGATIVE NEGATIVE Final   Influenza B by PCR NEGATIVE NEGATIVE Final    Comment: (NOTE) The Xpert Xpress SARS-CoV-2/FLU/RSV plus assay is intended as an aid in the diagnosis of influenza from Nasopharyngeal swab specimens and should not be used as a sole basis for treatment. Nasal washings and aspirates are unacceptable for Xpert Xpress SARS-CoV-2/FLU/RSV testing.  Fact Sheet for Patients: BloggerCourse.com  Fact Sheet for Healthcare Providers: SeriousBroker.it  This test is not yet approved or cleared by the [place] FDA and has been authorized for detection and/or diagnosis of SARS-CoV-2 by FDA under an Emergency Use Authorization (EUA). This EUA will remain in effect (meaning this test can be used) for the duration of the COVID-19 declaration under Section 564(b)(1) of the Act, 21 U.S.C. section 360bbb-3(b)(1), unless the authorization is terminated or revoked.  Performed at [facility], [address] [place], [place] [address]   Surgical PCR screen     Status: Abnormal   Collection Time: 07/06/20  6:27 PM   Specimen: Nasal Mucosa; Nasal Swab  Result Value Ref Range Status   MRSA, PCR POSITIVE (A) NEGATIVE Final    Comment: RESULT CALLED TO, READ BACK BY AND VERIFIED WITH: rn c anderson at  2116 07/06/20 [place]hank a    Staphylococcus aureus POSITIVE (A) NEGATIVE Final    Comment: (NOTE) The Xpert SA Assay (FDA approved for NASAL specimens in patients 44 years of age and older), is one component of a comprehensive surveillance program. It is not intended  to diagnose infection nor to guide or monitor treatment. Performed at [facility], [address] [place], [place] [address]   SARS CORONAVIRUS 2 (TAT 6-24 HRS) Nasopharyngeal Nasopharyngeal Swab     Status: None   Collection Time: 07/08/20 10:30 PM   Specimen: Nasopharyngeal Swab  Result Value Ref Range Status   SARS Coronavirus 2 NEGATIVE NEGATIVE Final    Comment: (NOTE) SARS-CoV-2 target nucleic acids are NOT DETECTED.  The SARS-CoV-2 RNA is generally detectable in upper and lower respiratory specimens during the acute phase of infection. Negative results do not preclude SARS-CoV-2 infection, do not rule out co-infections with other pathogens, and should not be used as the sole basis for treatment or other patient management decisions. Negative results must  be combined with clinical observations, patient history, and epidemiological information. The expected result is Negative.  Fact Sheet for Patients: HairSlick.no  Fact Sheet for Healthcare Providers: quierodirigir.com  This test is not yet approved or cleared by the [place] FDA and  has been authorized for detection and/or diagnosis of SARS-CoV-2 by FDA under an Emergency Use Authorization (EUA). This EUA will remain  in effect (meaning this test can be used) for the duration of the COVID-19 declaration under Se ction 564(b)(1) of the Act, 21 U.S.C. section 360bbb-3(b)(1), unless the authorization is terminated or revoked sooner.  Performed at [facility], [address] [place], [place] [address]      Labs: Basic Metabolic Panel: Recent Labs  Lab 07/06/20 1406 07/07/20 0258 07/08/20 0259  NA 138 140 136  K 4.1 4.4 4.6  CL 109 108 104  CO2 GLUCOSE 95 101* 130*  BUN 29* 27* 22  CREATININE 1.50* 1.48* 1.24  CALCIUM 8.4* 8.4* 7.9*   Liver Function Tests: Recent Labs  Lab 07/07/20 0258  AST 16  ALT 13   ALKPHOS 75  BILITOT 0.3  PROT 5.8*  ALBUMIN 2.8*   CBC: Recent Labs  Lab 07/06/20 1406 07/07/20 0258 07/08/20 0259 07/09/20 0258 07/09/20 1143 07/10/20 0311  WBC 7.9 7.1 8.9 8.2 8.6 8.1  NEUTROABS 5.4  --   --   --   --   --   HGB 12.5* 11.1* 10.1* 9.0* 9.7* 8.8*  HCT 38.3* 34.4* 31.8* 27.6* 29.9* 27.1*  MCV 101.1* 100.6* 102.6* 101.8* 101.4* 100.4*  PLT 168 148* 119* 123* 136* 147*   Principal Problem:   Closed left hip fracture, initial encounter (HCC) Active Problems:   AKI (acute kidney injury) (HCC)   Thrombocytopenia (HCC)   Macrocytic anemia   Dementia (HCC)   Time coordinating discharge: 25 minutes  Signed:  Brendia Sacks, MD  [facility]  07/10/2020, 8:11 AM

## 2020-07-10 DIAGNOSIS — Z9181 History of falling: Secondary | ICD-10-CM | POA: Diagnosis not present

## 2020-07-10 DIAGNOSIS — E46 Unspecified protein-calorie malnutrition: Secondary | ICD-10-CM | POA: Diagnosis not present

## 2020-07-10 DIAGNOSIS — Z96642 Presence of left artificial hip joint: Secondary | ICD-10-CM | POA: Diagnosis not present

## 2020-07-10 DIAGNOSIS — E559 Vitamin D deficiency, unspecified: Secondary | ICD-10-CM | POA: Diagnosis not present

## 2020-07-10 DIAGNOSIS — I5030 Unspecified diastolic (congestive) heart failure: Secondary | ICD-10-CM | POA: Diagnosis not present

## 2020-07-10 DIAGNOSIS — N1831 Chronic kidney disease, stage 3a: Secondary | ICD-10-CM | POA: Diagnosis not present

## 2020-07-10 DIAGNOSIS — N183 Chronic kidney disease, stage 3 unspecified: Secondary | ICD-10-CM | POA: Diagnosis not present

## 2020-07-10 DIAGNOSIS — M6281 Muscle weakness (generalized): Secondary | ICD-10-CM | POA: Diagnosis not present

## 2020-07-10 DIAGNOSIS — R54 Age-related physical debility: Secondary | ICD-10-CM | POA: Diagnosis not present

## 2020-07-10 DIAGNOSIS — D649 Anemia, unspecified: Secondary | ICD-10-CM | POA: Diagnosis not present

## 2020-07-10 DIAGNOSIS — R1312 Dysphagia, oropharyngeal phase: Secondary | ICD-10-CM | POA: Diagnosis not present

## 2020-07-10 DIAGNOSIS — I25119 Atherosclerotic heart disease of native coronary artery with unspecified angina pectoris: Secondary | ICD-10-CM | POA: Diagnosis not present

## 2020-07-10 DIAGNOSIS — S72002D Fracture of unspecified part of neck of left femur, subsequent encounter for closed fracture with routine healing: Secondary | ICD-10-CM | POA: Diagnosis not present

## 2020-07-10 DIAGNOSIS — K219 Gastro-esophageal reflux disease without esophagitis: Secondary | ICD-10-CM | POA: Diagnosis not present

## 2020-07-10 DIAGNOSIS — Z5189 Encounter for other specified aftercare: Secondary | ICD-10-CM | POA: Diagnosis not present

## 2020-07-10 DIAGNOSIS — R1310 Dysphagia, unspecified: Secondary | ICD-10-CM | POA: Diagnosis not present

## 2020-07-10 DIAGNOSIS — M6259 Muscle wasting and atrophy, not elsewhere classified, multiple sites: Secondary | ICD-10-CM | POA: Diagnosis not present

## 2020-07-10 DIAGNOSIS — D518 Other vitamin B12 deficiency anemias: Secondary | ICD-10-CM | POA: Diagnosis not present

## 2020-07-10 DIAGNOSIS — R2689 Other abnormalities of gait and mobility: Secondary | ICD-10-CM | POA: Diagnosis not present

## 2020-07-10 DIAGNOSIS — D519 Vitamin B12 deficiency anemia, unspecified: Secondary | ICD-10-CM | POA: Diagnosis not present

## 2020-07-10 DIAGNOSIS — M25552 Pain in left hip: Secondary | ICD-10-CM | POA: Diagnosis not present

## 2020-07-10 DIAGNOSIS — D508 Other iron deficiency anemias: Secondary | ICD-10-CM | POA: Diagnosis not present

## 2020-07-10 DIAGNOSIS — E785 Hyperlipidemia, unspecified: Secondary | ICD-10-CM | POA: Diagnosis not present

## 2020-07-10 LAB — CBC
HCT: 27.1 % — ABNORMAL LOW (ref 39.0–52.0)
Hemoglobin: 8.8 g/dL — ABNORMAL LOW (ref 13.0–17.0)
MCH: 32.6 pg (ref 26.0–34.0)
MCHC: 32.5 g/dL (ref 30.0–36.0)
MCV: 100.4 fL — ABNORMAL HIGH (ref 80.0–100.0)
Platelets: 147 10*3/uL — ABNORMAL LOW (ref 150–400)
RBC: 2.7 MIL/uL — ABNORMAL LOW (ref 4.22–5.81)
RDW: 12 % (ref 11.5–15.5)
WBC: 8.1 10*3/uL (ref 4.0–10.5)
nRBC: 0 % (ref 0.0–0.2)

## 2020-07-10 NOTE — TOC Transition Note (Signed)
Transition of Care [facility]) - CM/SW Discharge Note   Patient Details  Name: [name] MRN: [id number] Date of Birth: 1941-01-19  Transition of Care [facility]) CM/SW Contact:  Amada Jupiter, LCSW Phone Number: 07/10/2020, 9:24 AM   Clinical Narrative:     Pt to dc to [facility] in [place], [place] today with transport via [employer].  Daughter to follow.  No further TOC needs.  Final next level of care: Skilled Nursing Facility Barriers to Discharge: Barriers Resolved   Patient Goals and CMS Choice Patient states their goals for this hospitalization and ongoing recovery are:: rehab closer to his daughter      Discharge Placement   Existing PASRR number confirmed : 07/08/20          Patient chooses bed at:  ([facility] in [place], [place]) Patient to be transferred to facility by: [employer] [phone] Name of family member notified: daughter, Herbert Seta Patient and family notified of of transfer: 07/10/20  Discharge Plan and Services In-house Referral: Clinical Social Work   Post Acute Care Choice: Skilled Nursing Facility          DME Arranged: N/A DME Agency: NA                  Social Determinants of Health (SDOH) Interventions     Readmission Risk Interventions No flowsheet data found.

## 2020-07-13 DIAGNOSIS — D649 Anemia, unspecified: Secondary | ICD-10-CM | POA: Diagnosis not present

## 2020-07-13 DIAGNOSIS — N1831 Chronic kidney disease, stage 3a: Secondary | ICD-10-CM | POA: Diagnosis not present

## 2020-07-13 DIAGNOSIS — R54 Age-related physical debility: Secondary | ICD-10-CM | POA: Diagnosis not present

## 2020-07-15 DIAGNOSIS — D518 Other vitamin B12 deficiency anemias: Secondary | ICD-10-CM | POA: Diagnosis not present

## 2020-07-15 DIAGNOSIS — E559 Vitamin D deficiency, unspecified: Secondary | ICD-10-CM | POA: Diagnosis not present

## 2020-07-15 DIAGNOSIS — D508 Other iron deficiency anemias: Secondary | ICD-10-CM | POA: Diagnosis not present

## 2020-07-20 DIAGNOSIS — D508 Other iron deficiency anemias: Secondary | ICD-10-CM | POA: Diagnosis not present

## 2020-07-21 DIAGNOSIS — Z5189 Encounter for other specified aftercare: Secondary | ICD-10-CM | POA: Diagnosis not present

## 2020-07-21 DIAGNOSIS — M25552 Pain in left hip: Secondary | ICD-10-CM | POA: Diagnosis not present

## 2020-07-28 ENCOUNTER — Other Ambulatory Visit: Payer: Self-pay | Admitting: Neurology

## 2020-07-29 DIAGNOSIS — R54 Age-related physical debility: Secondary | ICD-10-CM | POA: Diagnosis not present

## 2020-07-29 DIAGNOSIS — E785 Hyperlipidemia, unspecified: Secondary | ICD-10-CM | POA: Diagnosis not present

## 2020-07-29 DIAGNOSIS — N1831 Chronic kidney disease, stage 3a: Secondary | ICD-10-CM | POA: Diagnosis not present

## 2020-08-02 DIAGNOSIS — I5032 Chronic diastolic (congestive) heart failure: Secondary | ICD-10-CM | POA: Diagnosis not present

## 2020-08-02 DIAGNOSIS — N1831 Chronic kidney disease, stage 3a: Secondary | ICD-10-CM | POA: Diagnosis not present

## 2020-08-02 DIAGNOSIS — Z471 Aftercare following joint replacement surgery: Secondary | ICD-10-CM | POA: Diagnosis not present

## 2020-08-02 DIAGNOSIS — E46 Unspecified protein-calorie malnutrition: Secondary | ICD-10-CM | POA: Diagnosis not present

## 2020-08-02 DIAGNOSIS — I739 Peripheral vascular disease, unspecified: Secondary | ICD-10-CM | POA: Diagnosis not present

## 2020-08-02 DIAGNOSIS — I130 Hypertensive heart and chronic kidney disease with heart failure and stage 1 through stage 4 chronic kidney disease, or unspecified chronic kidney disease: Secondary | ICD-10-CM | POA: Diagnosis not present

## 2020-08-04 DIAGNOSIS — E46 Unspecified protein-calorie malnutrition: Secondary | ICD-10-CM | POA: Diagnosis not present

## 2020-08-04 DIAGNOSIS — I5032 Chronic diastolic (congestive) heart failure: Secondary | ICD-10-CM | POA: Diagnosis not present

## 2020-08-04 DIAGNOSIS — Z471 Aftercare following joint replacement surgery: Secondary | ICD-10-CM | POA: Diagnosis not present

## 2020-08-04 DIAGNOSIS — N1831 Chronic kidney disease, stage 3a: Secondary | ICD-10-CM | POA: Diagnosis not present

## 2020-08-04 DIAGNOSIS — I130 Hypertensive heart and chronic kidney disease with heart failure and stage 1 through stage 4 chronic kidney disease, or unspecified chronic kidney disease: Secondary | ICD-10-CM | POA: Diagnosis not present

## 2020-08-04 DIAGNOSIS — I739 Peripheral vascular disease, unspecified: Secondary | ICD-10-CM | POA: Diagnosis not present

## 2020-08-06 DIAGNOSIS — N1831 Chronic kidney disease, stage 3a: Secondary | ICD-10-CM | POA: Diagnosis not present

## 2020-08-06 DIAGNOSIS — I739 Peripheral vascular disease, unspecified: Secondary | ICD-10-CM | POA: Diagnosis not present

## 2020-08-06 DIAGNOSIS — I130 Hypertensive heart and chronic kidney disease with heart failure and stage 1 through stage 4 chronic kidney disease, or unspecified chronic kidney disease: Secondary | ICD-10-CM | POA: Diagnosis not present

## 2020-08-06 DIAGNOSIS — E46 Unspecified protein-calorie malnutrition: Secondary | ICD-10-CM | POA: Diagnosis not present

## 2020-08-06 DIAGNOSIS — Z471 Aftercare following joint replacement surgery: Secondary | ICD-10-CM | POA: Diagnosis not present

## 2020-08-06 DIAGNOSIS — I5032 Chronic diastolic (congestive) heart failure: Secondary | ICD-10-CM | POA: Diagnosis not present

## 2020-08-10 DIAGNOSIS — I739 Peripheral vascular disease, unspecified: Secondary | ICD-10-CM | POA: Diagnosis not present

## 2020-08-10 DIAGNOSIS — Z471 Aftercare following joint replacement surgery: Secondary | ICD-10-CM | POA: Diagnosis not present

## 2020-08-10 DIAGNOSIS — I130 Hypertensive heart and chronic kidney disease with heart failure and stage 1 through stage 4 chronic kidney disease, or unspecified chronic kidney disease: Secondary | ICD-10-CM | POA: Diagnosis not present

## 2020-08-10 DIAGNOSIS — I5032 Chronic diastolic (congestive) heart failure: Secondary | ICD-10-CM | POA: Diagnosis not present

## 2020-08-10 DIAGNOSIS — E46 Unspecified protein-calorie malnutrition: Secondary | ICD-10-CM | POA: Diagnosis not present

## 2020-08-10 DIAGNOSIS — N1831 Chronic kidney disease, stage 3a: Secondary | ICD-10-CM | POA: Diagnosis not present

## 2020-08-11 DIAGNOSIS — K219 Gastro-esophageal reflux disease without esophagitis: Secondary | ICD-10-CM | POA: Diagnosis not present

## 2020-08-11 DIAGNOSIS — E46 Unspecified protein-calorie malnutrition: Secondary | ICD-10-CM | POA: Diagnosis not present

## 2020-08-11 DIAGNOSIS — N1831 Chronic kidney disease, stage 3a: Secondary | ICD-10-CM | POA: Diagnosis not present

## 2020-08-11 DIAGNOSIS — I10 Essential (primary) hypertension: Secondary | ICD-10-CM | POA: Diagnosis not present

## 2020-08-11 DIAGNOSIS — I252 Old myocardial infarction: Secondary | ICD-10-CM | POA: Diagnosis not present

## 2020-08-11 DIAGNOSIS — I739 Peripheral vascular disease, unspecified: Secondary | ICD-10-CM | POA: Diagnosis not present

## 2020-08-11 DIAGNOSIS — Z471 Aftercare following joint replacement surgery: Secondary | ICD-10-CM | POA: Diagnosis not present

## 2020-08-11 DIAGNOSIS — E7800 Pure hypercholesterolemia, unspecified: Secondary | ICD-10-CM | POA: Diagnosis not present

## 2020-08-11 DIAGNOSIS — I130 Hypertensive heart and chronic kidney disease with heart failure and stage 1 through stage 4 chronic kidney disease, or unspecified chronic kidney disease: Secondary | ICD-10-CM | POA: Diagnosis not present

## 2020-08-11 DIAGNOSIS — N1832 Chronic kidney disease, stage 3b: Secondary | ICD-10-CM | POA: Diagnosis not present

## 2020-08-11 DIAGNOSIS — I2510 Atherosclerotic heart disease of native coronary artery without angina pectoris: Secondary | ICD-10-CM | POA: Diagnosis not present

## 2020-08-11 DIAGNOSIS — N183 Chronic kidney disease, stage 3 unspecified: Secondary | ICD-10-CM | POA: Diagnosis not present

## 2020-08-11 DIAGNOSIS — I5032 Chronic diastolic (congestive) heart failure: Secondary | ICD-10-CM | POA: Diagnosis not present

## 2020-08-13 DIAGNOSIS — N1831 Chronic kidney disease, stage 3a: Secondary | ICD-10-CM | POA: Diagnosis not present

## 2020-08-13 DIAGNOSIS — I739 Peripheral vascular disease, unspecified: Secondary | ICD-10-CM | POA: Diagnosis not present

## 2020-08-13 DIAGNOSIS — I5032 Chronic diastolic (congestive) heart failure: Secondary | ICD-10-CM | POA: Diagnosis not present

## 2020-08-13 DIAGNOSIS — Z471 Aftercare following joint replacement surgery: Secondary | ICD-10-CM | POA: Diagnosis not present

## 2020-08-13 DIAGNOSIS — I130 Hypertensive heart and chronic kidney disease with heart failure and stage 1 through stage 4 chronic kidney disease, or unspecified chronic kidney disease: Secondary | ICD-10-CM | POA: Diagnosis not present

## 2020-08-13 DIAGNOSIS — E46 Unspecified protein-calorie malnutrition: Secondary | ICD-10-CM | POA: Diagnosis not present

## 2020-08-14 DIAGNOSIS — I5032 Chronic diastolic (congestive) heart failure: Secondary | ICD-10-CM | POA: Diagnosis not present

## 2020-08-14 DIAGNOSIS — I130 Hypertensive heart and chronic kidney disease with heart failure and stage 1 through stage 4 chronic kidney disease, or unspecified chronic kidney disease: Secondary | ICD-10-CM | POA: Diagnosis not present

## 2020-08-14 DIAGNOSIS — I739 Peripheral vascular disease, unspecified: Secondary | ICD-10-CM | POA: Diagnosis not present

## 2020-08-14 DIAGNOSIS — E46 Unspecified protein-calorie malnutrition: Secondary | ICD-10-CM | POA: Diagnosis not present

## 2020-08-14 DIAGNOSIS — N1831 Chronic kidney disease, stage 3a: Secondary | ICD-10-CM | POA: Diagnosis not present

## 2020-08-14 DIAGNOSIS — Z471 Aftercare following joint replacement surgery: Secondary | ICD-10-CM | POA: Diagnosis not present

## 2020-08-27 DIAGNOSIS — I2510 Atherosclerotic heart disease of native coronary artery without angina pectoris: Secondary | ICD-10-CM | POA: Diagnosis not present

## 2020-08-27 DIAGNOSIS — Z8781 Personal history of (healed) traumatic fracture: Secondary | ICD-10-CM | POA: Diagnosis not present

## 2020-09-04 ENCOUNTER — Other Ambulatory Visit: Payer: Self-pay | Admitting: Internal Medicine

## 2020-09-04 DIAGNOSIS — Z8781 Personal history of (healed) traumatic fracture: Secondary | ICD-10-CM

## 2020-10-26 ENCOUNTER — Other Ambulatory Visit: Payer: Self-pay | Admitting: Neurology

## 2020-11-11 DIAGNOSIS — I2510 Atherosclerotic heart disease of native coronary artery without angina pectoris: Secondary | ICD-10-CM | POA: Diagnosis not present

## 2020-11-11 DIAGNOSIS — E7800 Pure hypercholesterolemia, unspecified: Secondary | ICD-10-CM | POA: Diagnosis not present

## 2020-11-11 DIAGNOSIS — I10 Essential (primary) hypertension: Secondary | ICD-10-CM | POA: Diagnosis not present

## 2020-11-11 DIAGNOSIS — K219 Gastro-esophageal reflux disease without esophagitis: Secondary | ICD-10-CM | POA: Diagnosis not present

## 2020-11-11 DIAGNOSIS — N183 Chronic kidney disease, stage 3 unspecified: Secondary | ICD-10-CM | POA: Diagnosis not present

## 2020-11-25 ENCOUNTER — Ambulatory Visit: Payer: Medicare Other | Admitting: Neurology

## 2020-11-25 ENCOUNTER — Other Ambulatory Visit: Payer: Self-pay

## 2020-11-25 ENCOUNTER — Encounter: Payer: Self-pay | Admitting: Neurology

## 2020-11-25 VITALS — BP 151/76 | HR 53 | Ht 72.0 in | Wt 160.4 lb

## 2020-11-25 DIAGNOSIS — R413 Other amnesia: Secondary | ICD-10-CM

## 2020-11-25 NOTE — Progress Notes (Signed)
Reason for visit: Memory disturbance  [name] is an 80 y.o. male  History of present illness:  [name] is an 80 year old right-handed white male with a history of a memory disturbance.  The patient comes in with a caretaker today.  The patient has been relatively stable with his memory since last seen.  He did fall around 06 Jul 2020 and fractured the left hip but he has recovered from this.  He uses a walker for ambulation, he has not had any further falls.  He is sleeping well at night.  He had some weight loss following the hip fracture, but he is now gaining weight.  He remains on Namenda and Aricept.  He has had some anxiety issues previously.  It is not clear whether or not he is still taking the BuSpar.  Past Medical History:  Diagnosis Date   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 09/25/2015 09/25/2015   Aspiration pneumonia (HCC) 01/17/2016   Bilateral dry eyes    Bile peritonitis due to gastric tube dislodgement 12/03/2015   BPH (benign prostatic hyperplasia)    Central pterygium of right eye    Chronic respiratory failure with hypoxia (HCC)    Coronary artery disease 1997, 2009   bare mental stent right coronart artery, occlusive followed by Dr. 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 in [place]   Peptic ulcer    Pneumatosis of intestines 12/11/2015   Pneumonia    HISTORY OF IN CHILDHOOD   Posterior vitreous detachment 10/30/2012   Pyloric stricture 11/24/2015   Salzmann's  nodular dystrophy of left eye    SBO (small bowel obstruction) (HCC) 01/20/2016   Urinary hesitancy    Wears glasses     Past Surgical History:  Procedure Laterality Date   CARDIAC CATHETERIZATION     CHOLECYSTECTOMY     ESOPHAGOGASTRODUODENOSCOPY N/A 12/01/2015   Procedure: ESOPHAGOGASTRODUODENOSCOPY (EGD) BALLOON DILATION OF DUODENAL STRICTURE;  Surgeon: Karie Soda, MD;  [facility]: [facility];  Service: General;  Laterality: N/A;   ESOPHAGOGASTRODUODENOSCOPY N/A 12/14/2015   Procedure: ESOPHAGOGASTRODUODENOSCOPY (EGD);  Surgeon: Vida Rigger, MD;  Location: [facility];  Service: Endoscopy;  Laterality: N/A;  Patient has tracheostomy   ESOPHAGOGASTRODUODENOSCOPY (EGD) WITH PROPOFOL N/A 07/01/2015   Procedure: ESOPHAGOGASTRODUODENOSCOPY (EGD) WITH PROPOFOL;  Surgeon:  Modena, MD;  [facility];  Service: Endoscopy;  Laterality: N/A;   ESOPHAGOGASTRODUODENOSCOPY (EGD) WITH PROPOFOL N/A 11/23/2015   Procedure: ESOPHAGOGASTRODUODENOSCOPY (EGD) WITH PROPOFOL;  Surgeon:  Modena, MD;  Location: [facility];  Service: Endoscopy;  Laterality: N/A;  may need to intubate   EYE SURGERY     growth on right eye removed    HIP ARTHROPLASTY Left 07/07/2020   Procedure: ARTHROPLASTY BIPOLAR HIP (HEMIARTHROPLASTY);  Surgeon: [name], MD;  [facility]: [facility];  Service: Orthopedics;  Laterality: Left;   IR GENERIC HISTORICAL  12/13/2015   IR REPLC DUODEN/JEJUNO TUBE PERCUT W/FLUORO 12/13/2015 [name], MD [facility]   IR GENERIC HISTORICAL  02/03/2016  IR REPLC GASTRO/COLONIC TUBE PERCUT W/FLUORO 02/03/2016 Berdine Dance, MD [facility]   IR GENERIC HISTORICAL  02/04/2016   IR GASTR TUBE CONVERT GASTR-JEJ PER W/FL MOD SED 02/04/2016 Malachy Moan, MD [facility]   IR GENERIC HISTORICAL  02/25/2016   IR SINUS/FIST TUBE CHK-NON GI 02/25/2016 Irish Lack, MD [facility]   IR GENERIC HISTORICAL  03/18/2016   IR CM INJ ANY COLONIC TUBE W/FLUORO 03/18/2016 Simonne Come, MD  [facility]   IR GENERIC HISTORICAL  03/18/2016   IR GJ TUBE CHANGE 03/18/2016 Simonne Come, MD [facility]   IR GENERIC HISTORICAL  03/22/2016   IR CM INJ ANY COLONIC TUBE W/FLUORO 03/22/2016 Simonne Come, MD [facility]   IR GENERIC HISTORICAL  04/13/2016   IR CM INJ ANY COLONIC TUBE W/FLUORO 04/13/2016 Richarda Overlie, MD [facility]   LAPAROSCOPIC GASTROSTOMY N/A 12/01/2015   Procedure: LAPAROSCOPIC PLACEMENT OF FEEDING JEJUNOSTOMY AND GASTROSTOMY TUBE;  Surgeon: Karie Soda, MD;  [facility];  Service: General;  Laterality: N/A;   LAPAROSCOPY N/A 12/03/2015   Procedure: LAPAROSCOPY DIAGNOSTIC, OMENTOPEXY, JEJUNOSTOMY, WASH OUT;  Surgeon: Karie Soda, MD;  [facility];  Service: General;  Laterality: N/A;   LAPAROTOMY N/A 02/06/2016   Procedure: EXPLORATORY LAPAROTOMY, LYSIS OF ADHESIONS, SEGMENTAL SMALL BOWEL RESECTION, GASTROJEJUNOSTOMY;  Surgeon: Chevis Pretty III, MD;  [facility];  Service: General;  Laterality: N/A;   STENT TO HEART     2 1997 AND 1 IN 1996   TONSILLECTOMY     XI ROBOTIC VAGOTOMY AND ANTRECTOMY N/A 09/25/2015   Procedure: XI ROBOTIC ANTERIOR AND POSTERIOR VAGOTOMY, BILROTH I  ANASTOMOSIS DOR FUNDIPLICATION, OMENTOPEXY  UPPER ENDOSCOPY;  Surgeon: Karie Soda, MD;  Location: [facility];  Service: General;  Laterality: N/A;    Family History  Problem Relation Age of Onset   Heart attack Mother     Social history:  reports that he quit smoking about 25 years ago. His smoking use included cigarettes. He has a 40.00 pack-year smoking history. He has never used smokeless tobacco. He reports that he does not drink alcohol and does not use drugs.    Allergies  Allergen Reactions   Flomax [Tamsulosin Hcl] Other (See Comments)    Leg weakness   Lisinopril Cough   Lorazepam Other (See Comments)    "i don't like it"  Prefers Xanax or valium   Uroxatral [Alfuzosin Hcl Er] Other (See Comments)    Leg weakness    Medications:  Prior to Admission medications    Medication Sig Start Date End Date Taking? Authorizing Provider  busPIRone (BUSPAR) 5 MG tablet Take 1 tablet (5 mg total) by mouth 2 (two) times daily. 05/14/20  Yes York Spaniel, MD  cholestyramine Lanetta Inch) 4 GM/DOSE powder Take 1 g by mouth daily. 03/12/20  Yes [provider]  divalproex (DEPAKOTE) 125 MG DR tablet TAKE TWO TABLETS BY MOUTH TWICE DAILY 10/26/20  Yes York Spaniel, MD  donepezil (ARICEPT) 5 MG tablet Take 1 tablet (5 mg total) by mouth at bedtime. 07/28/20  Yes York Spaniel, MD  enoxaparin (LOVENOX) 40 MG/0.4ML injection Inject 0.4 mLs (40 mg total) into the skin daily. Total 4 weeks, finish 6/21. 07/09/20  Yes Standley Brooking, MD  memantine (NAMENDA) 10 MG tablet TAKE 1 TABLET BY MOUTH TWICE DAILY. Patient taking differently: Take 10 mg by mouth 2 (two) times daily. 06/01/20  Yes York Spaniel, MD  Multiple Vitamin (MULTI-VITAMINS) TABS Take 1 tablet by mouth daily.  Yes [provider]  nitroGLYCERIN (NITROSTAT) 0.4 MG SL tablet Place 1 tablet (0.4 mg total) under the tongue every 5 (five) minutes as needed for chest pain. 02/26/16  Yes Focht, Jessica L, PA  pantoprazole (PROTONIX) 40 MG tablet Take 40 mg by mouth daily. 10/19/17  Yes [provider]  polyethylene glycol (MIRALAX / GLYCOLAX) 17 g packet Take 17 g by mouth daily. 07/10/20  Yes Standley Brooking, MD  pravastatin (PRAVACHOL) 40 MG tablet Take 40 mg by mouth daily. 09/06/17  Yes [provider]  senna (SENOKOT) 8.6 MG TABS tablet Take 1 tablet (8.6 mg total) by mouth at bedtime. 07/09/20  Yes Standley Brooking, MD  traMADol (ULTRAM) 50 MG tablet Take 1 tablet (50 mg total) by mouth every 6 (six) hours as needed. 07/09/20 07/09/21 Yes Armida Sans, PA-C  aspirin EC 81 MG EC tablet Take 1 tablet (81 mg total) by mouth daily. Swallow whole. Patient not taking: Reported on 11/25/2020 07/10/20   Standley Brooking, MD    ROS:  Out of a complete 14 system review of  symptoms, the patient complains only of the following symptoms, and all other reviewed systems are negative.  Memory problems Anxiety Walking difficulty  Height 6' (1.829 m), weight 160 lb 6.4 oz (72.8 kg).  Physical Exam  General: The patient is alert and cooperative at the time of the examination.  Skin: No significant peripheral edema is noted.   Neurologic Exam  Mental status: The patient is alert and oriented x 3 at the time of the examination. The Mini-Mental status examination done today shows a total score 22/30.  The patient is able to name 10 four-legged animals in 60 seconds.   Cranial nerves: Facial symmetry is present. Speech is dysarthric, not aphasic. Extraocular movements are full. Visual fields are full.  Motor: The patient has good strength in all 4 extremities.  Sensory examination: Soft touch sensation is symmetric on the face, arms, and legs.  Coordination: The patient has good finger-nose-finger and heel-to-shin bilaterally.  Gait and station: The patient has a slightly wide-based gait, the patient walks with a walker.  He has good stride and stability with a walker.  Tandem gait was not attempted.  Romberg is negative but is slightly unsteady.  Reflexes: Deep tendon reflexes are symmetric.   Assessment/Plan:  1.  Memory disturbance  2.  Anxiety  3.  Gait disturbance  The patient has lost about 6 pounds since last seen, but the caretaker indicates that his appetite is good at this point.  We will follow the weight loss issue, he may need to come off of the Aricept in the future if he continues to lose weight.  He will be maintained on the Namenda.  He will follow-up here in 6 months, in the future he can be seen through Dr. Vickey Huger.  Marlan Palau MD 11/25/2020 11:35 AM  [facility] [address] [place], [place] [address]  Phone [phone] Fax [phone]

## 2020-12-15 DIAGNOSIS — D0439 Carcinoma in situ of skin of other parts of face: Secondary | ICD-10-CM | POA: Diagnosis not present

## 2020-12-15 DIAGNOSIS — D044 Carcinoma in situ of skin of scalp and neck: Secondary | ICD-10-CM | POA: Diagnosis not present

## 2020-12-17 ENCOUNTER — Other Ambulatory Visit: Payer: Self-pay | Admitting: Neurology

## 2020-12-18 DIAGNOSIS — N1832 Chronic kidney disease, stage 3b: Secondary | ICD-10-CM | POA: Diagnosis not present

## 2020-12-18 DIAGNOSIS — I252 Old myocardial infarction: Secondary | ICD-10-CM | POA: Diagnosis not present

## 2020-12-18 DIAGNOSIS — I10 Essential (primary) hypertension: Secondary | ICD-10-CM | POA: Diagnosis not present

## 2020-12-18 DIAGNOSIS — K219 Gastro-esophageal reflux disease without esophagitis: Secondary | ICD-10-CM | POA: Diagnosis not present

## 2020-12-18 DIAGNOSIS — E7800 Pure hypercholesterolemia, unspecified: Secondary | ICD-10-CM | POA: Diagnosis not present

## 2020-12-18 DIAGNOSIS — I2510 Atherosclerotic heart disease of native coronary artery without angina pectoris: Secondary | ICD-10-CM | POA: Diagnosis not present

## 2021-01-21 DIAGNOSIS — I2510 Atherosclerotic heart disease of native coronary artery without angina pectoris: Secondary | ICD-10-CM | POA: Diagnosis not present

## 2021-01-21 DIAGNOSIS — I10 Essential (primary) hypertension: Secondary | ICD-10-CM | POA: Diagnosis not present

## 2021-01-21 DIAGNOSIS — I252 Old myocardial infarction: Secondary | ICD-10-CM | POA: Diagnosis not present

## 2021-01-21 DIAGNOSIS — E7800 Pure hypercholesterolemia, unspecified: Secondary | ICD-10-CM | POA: Diagnosis not present

## 2021-01-21 DIAGNOSIS — K219 Gastro-esophageal reflux disease without esophagitis: Secondary | ICD-10-CM | POA: Diagnosis not present

## 2021-01-21 DIAGNOSIS — N1832 Chronic kidney disease, stage 3b: Secondary | ICD-10-CM | POA: Diagnosis not present

## 2021-02-11 DIAGNOSIS — R197 Diarrhea, unspecified: Secondary | ICD-10-CM | POA: Diagnosis not present

## 2021-02-14 ENCOUNTER — Other Ambulatory Visit: Payer: Self-pay | Admitting: Neurology

## 2021-02-17 DIAGNOSIS — E7800 Pure hypercholesterolemia, unspecified: Secondary | ICD-10-CM | POA: Diagnosis not present

## 2021-02-17 DIAGNOSIS — I252 Old myocardial infarction: Secondary | ICD-10-CM | POA: Diagnosis not present

## 2021-02-17 DIAGNOSIS — I10 Essential (primary) hypertension: Secondary | ICD-10-CM | POA: Diagnosis not present

## 2021-02-17 DIAGNOSIS — I2510 Atherosclerotic heart disease of native coronary artery without angina pectoris: Secondary | ICD-10-CM | POA: Diagnosis not present

## 2021-02-17 DIAGNOSIS — K219 Gastro-esophageal reflux disease without esophagitis: Secondary | ICD-10-CM | POA: Diagnosis not present

## 2021-02-17 DIAGNOSIS — N1831 Chronic kidney disease, stage 3a: Secondary | ICD-10-CM | POA: Diagnosis not present

## 2021-02-22 ENCOUNTER — Other Ambulatory Visit: Payer: Self-pay | Admitting: *Deleted

## 2021-02-22 MED ORDER — DONEPEZIL HCL 5 MG PO TABS: 5.0000 mg | ORAL_TABLET | Freq: Every day | ORAL | 1 refills | Status: DC

## 2021-02-26 ENCOUNTER — Inpatient Hospital Stay: Admission: RE | Admit: 2021-02-26 | Payer: Medicare Other | Source: Ambulatory Visit

## 2021-03-16 DIAGNOSIS — E7800 Pure hypercholesterolemia, unspecified: Secondary | ICD-10-CM | POA: Diagnosis not present

## 2021-03-16 DIAGNOSIS — I10 Essential (primary) hypertension: Secondary | ICD-10-CM | POA: Diagnosis not present

## 2021-03-16 DIAGNOSIS — Z23 Encounter for immunization: Secondary | ICD-10-CM | POA: Diagnosis not present

## 2021-03-16 DIAGNOSIS — I2510 Atherosclerotic heart disease of native coronary artery without angina pectoris: Secondary | ICD-10-CM | POA: Diagnosis not present

## 2021-03-16 DIAGNOSIS — R197 Diarrhea, unspecified: Secondary | ICD-10-CM | POA: Diagnosis not present

## 2021-03-16 DIAGNOSIS — N1831 Chronic kidney disease, stage 3a: Secondary | ICD-10-CM | POA: Diagnosis not present

## 2021-03-16 DIAGNOSIS — Z0000 Encounter for general adult medical examination without abnormal findings: Secondary | ICD-10-CM | POA: Diagnosis not present

## 2021-03-16 DIAGNOSIS — K219 Gastro-esophageal reflux disease without esophagitis: Secondary | ICD-10-CM | POA: Diagnosis not present

## 2021-03-19 DIAGNOSIS — E7800 Pure hypercholesterolemia, unspecified: Secondary | ICD-10-CM | POA: Diagnosis not present

## 2021-03-19 DIAGNOSIS — N1831 Chronic kidney disease, stage 3a: Secondary | ICD-10-CM | POA: Diagnosis not present

## 2021-03-19 DIAGNOSIS — I10 Essential (primary) hypertension: Secondary | ICD-10-CM | POA: Diagnosis not present

## 2021-03-19 DIAGNOSIS — I2510 Atherosclerotic heart disease of native coronary artery without angina pectoris: Secondary | ICD-10-CM | POA: Diagnosis not present

## 2021-04-19 ENCOUNTER — Other Ambulatory Visit: Payer: Self-pay

## 2021-04-19 MED ORDER — DIVALPROEX SODIUM 125 MG PO DR TAB: DELAYED_RELEASE_TABLET | ORAL | 5 refills | Status: DC

## 2021-04-20 DIAGNOSIS — I10 Essential (primary) hypertension: Secondary | ICD-10-CM | POA: Diagnosis not present

## 2021-04-20 DIAGNOSIS — E7800 Pure hypercholesterolemia, unspecified: Secondary | ICD-10-CM | POA: Diagnosis not present

## 2021-05-24 DIAGNOSIS — E7800 Pure hypercholesterolemia, unspecified: Secondary | ICD-10-CM | POA: Diagnosis not present

## 2021-05-24 DIAGNOSIS — N1831 Chronic kidney disease, stage 3a: Secondary | ICD-10-CM | POA: Diagnosis not present

## 2021-05-24 DIAGNOSIS — I2510 Atherosclerotic heart disease of native coronary artery without angina pectoris: Secondary | ICD-10-CM | POA: Diagnosis not present

## 2021-05-24 DIAGNOSIS — I10 Essential (primary) hypertension: Secondary | ICD-10-CM | POA: Diagnosis not present

## 2021-05-24 DIAGNOSIS — K219 Gastro-esophageal reflux disease without esophagitis: Secondary | ICD-10-CM | POA: Diagnosis not present

## 2021-05-27 ENCOUNTER — Encounter: Payer: Self-pay | Admitting: Neurology

## 2021-05-27 ENCOUNTER — Ambulatory Visit: Payer: Medicare Other | Admitting: Neurology

## 2021-05-27 ENCOUNTER — Telehealth: Payer: Self-pay | Admitting: Neurology

## 2021-05-27 VITALS — BP 162/61 | HR 49 | Ht 72.0 in | Wt 164.0 lb

## 2021-05-27 DIAGNOSIS — F324 Major depressive disorder, single episode, in partial remission: Secondary | ICD-10-CM | POA: Insufficient documentation

## 2021-05-27 DIAGNOSIS — F32A Depression, unspecified: Secondary | ICD-10-CM | POA: Diagnosis not present

## 2021-05-27 DIAGNOSIS — E7800 Pure hypercholesterolemia, unspecified: Secondary | ICD-10-CM | POA: Insufficient documentation

## 2021-05-27 DIAGNOSIS — K259 Gastric ulcer, unspecified as acute or chronic, without hemorrhage or perforation: Secondary | ICD-10-CM | POA: Insufficient documentation

## 2021-05-27 DIAGNOSIS — R195 Other fecal abnormalities: Secondary | ICD-10-CM | POA: Insufficient documentation

## 2021-05-27 DIAGNOSIS — R413 Other amnesia: Secondary | ICD-10-CM

## 2021-05-27 DIAGNOSIS — M51369 Other intervertebral disc degeneration, lumbar region without mention of lumbar back pain or lower extremity pain: Secondary | ICD-10-CM | POA: Insufficient documentation

## 2021-05-27 DIAGNOSIS — M5136 Other intervertebral disc degeneration, lumbar region: Secondary | ICD-10-CM | POA: Insufficient documentation

## 2021-05-27 DIAGNOSIS — F419 Anxiety disorder, unspecified: Secondary | ICD-10-CM

## 2021-05-27 DIAGNOSIS — F325 Major depressive disorder, single episode, in full remission: Secondary | ICD-10-CM | POA: Insufficient documentation

## 2021-05-27 DIAGNOSIS — I252 Old myocardial infarction: Secondary | ICD-10-CM | POA: Insufficient documentation

## 2021-05-27 DIAGNOSIS — H04123 Dry eye syndrome of bilateral lacrimal glands: Secondary | ICD-10-CM | POA: Insufficient documentation

## 2021-05-27 DIAGNOSIS — E291 Testicular hypofunction: Secondary | ICD-10-CM | POA: Insufficient documentation

## 2021-05-27 DIAGNOSIS — N1831 Chronic kidney disease, stage 3a: Secondary | ICD-10-CM | POA: Insufficient documentation

## 2021-05-27 DIAGNOSIS — F03B0 Unspecified dementia, moderate, without behavioral disturbance, psychotic disturbance, mood disturbance, and anxiety: Secondary | ICD-10-CM | POA: Diagnosis not present

## 2021-05-27 NOTE — Addendum Note (Signed)
Addended by: Christophe Louis E on: 05/27/2021 03:38 PM ? ? Modules accepted: Orders ? ?

## 2021-05-27 NOTE — Telephone Encounter (Signed)
FYI caregiver Sheri Sweeny(not on [employer] and provided the list of medications that pt is on ?divalproex (DEPAKOTE) 125 MG DR tablet ?loperamide 2mg (Dr ) ?memantine (NAMENDA) 10 MG tablet ?pantoprazole (PROTONIX) 40 MG tablet ?pravastatin (PRAVACHOL) 40 MG tablet ?[place] [address] vitamin 50 plus gummie ?and ?cholestyramine (QUESTRAN) 4 GM/DOSE powder ?No call back has been requested ?

## 2021-05-27 NOTE — Patient Instructions (Signed)
Great to see you today ?Call us about your medications ?I will send note to Dr. Valentina Lucks  ?Return here as needed ?

## 2021-05-27 NOTE — Progress Notes (Signed)
? ? ?Patient: [name] ?Date of Birth: 03/20/1940 ? ?Reason for Visit: Follow up for memory ?History from: Patient, caregiver, Cordelia Pen  ?Primary Neurologist: Dr. Anne Hahn  ? ?ASSESSMENT AND PLAN ?81 y.o. year old male  ? ?1.  Dementia ?2.  Anxiety ? ?-On Depakote, started to help with weight gain, felt that he had " dumping syndrome", the Depakote has been maintained, also helps mood, his weight is stable, up 4 pounds in the last 6 months ?-Continue Namenda 10 mg twice daily, Aricept has been stopped due to diarrhea ?-Reviewing Dr. Clarisa Kindred prior notes, his condition seems to have remained stable, he does have history of significant dysarthria since around 2017, does not appear to have changed much ?-After discussion with the patient and caregiver, have decided he will continue to follow-up with Dr. Valentina Lucks, return to our office on an as-needed basis ?-Caregiver will need to call back to verify medications ? ?HISTORY OF PRESENT ILLNESS: ?Today 05/27/21 ?[name] is here today for follow-up. Lives at his home, with 24 hour sitter care. Stopped Aricept due to diarrhea. Is much better. Likes to watch TV. No falls, using a walker. Does his ADLs, needs some supervision. Good appetite. At night time gets worried about doing being unlocked, does well with reassurance.  On Depakote, was initially started to help with weight gain, felt to have "dumping syndrome".  Weight is stable, up 4 lbs from 6 months ago. Depakote helps the mood.  He goes to the park daily with his sitter.  He has history of dysarthria since 2017, etiology is not clear.  He does have drooling at times.  Feels he has remained overall stable since last seen, no new issues.  Could not complete MMSE, said he didn't know the answers, couldn't answer the orientation questions.  ? ?HISTORY ?11/25/2020 Dr. Anne Hahn: [name] is an 81 year old right-handed white male with a history of a memory disturbance.  The patient comes in with a caretaker today.   The patient has been relatively stable with his memory since last seen.  He did fall around 06 Jul 2020 and fractured the left hip but he has recovered from this.  He uses a walker for ambulation, he has not had any further falls.  He is sleeping well at night.  He had some weight loss following the hip fracture, but he is now gaining weight.  He remains on Namenda and Aricept.  He has had some anxiety issues previously.  It is not clear whether or not he is still taking the BuSpar. ? ?REVIEW OF SYSTEMS: Out of a complete 14 system review of symptoms, the patient complains only of the following symptoms, and all other reviewed systems are negative. ? ?See HPI ? ?ALLERGIES: ?Allergies  ?Allergen Reactions  ? Alfuzosin   ?  Other reaction(s): leg weakness  ? Flomax [Tamsulosin Hcl] Other (See Comments)  ?  Leg weakness  ? Lisinopril Cough  ? Lorazepam Other (See Comments)  ?  "i don't like it"  Prefers Xanax or valium  ? Uroxatral [Alfuzosin Hcl Er] Other (See Comments)  ?  Leg weakness  ? ? ?HOME MEDICATIONS: ?Outpatient Medications Prior to Visit  ?Medication Sig Dispense Refill  ? busPIRone (BUSPAR) 5 MG tablet Take 1 tablet (5 mg total) by mouth 2 (two) times daily. 60 tablet 3  ? cholestyramine (QUESTRAN) 4 GM/DOSE powder Take 1 g by mouth daily.    ? divalproex (DEPAKOTE) 125 MG DR tablet TAKE TWO TABLETS BY MOUTH TWICE  DAILY 120 tablet 5  ? donepezil (ARICEPT) 5 MG tablet Take 1 tablet (5 mg total) by mouth at bedtime. 90 tablet 1  ? memantine (NAMENDA) 10 MG tablet TAKE ONE TABLET BY MOUTH TWICE DAILY 60 tablet 5  ? Multiple Vitamin (MULTI-VITAMINS) TABS Take 1 tablet by mouth daily.    ? nitroGLYCERIN (NITROSTAT) 0.4 MG SL tablet Place 1 tablet (0.4 mg total) under the tongue every 5 (five) minutes as needed for chest pain.  12  ? pantoprazole (PROTONIX) 40 MG tablet Take 40 mg by mouth daily.    ? polyethylene glycol (MIRALAX / GLYCOLAX) 17 g packet Take 17 g by mouth daily.    ? pravastatin (PRAVACHOL) 40  MG tablet Take 40 mg by mouth daily.    ? senna (SENOKOT) 8.6 MG TABS tablet Take 1 tablet (8.6 mg total) by mouth at bedtime.    ? traMADol (ULTRAM) 50 MG tablet Take 1 tablet (50 mg total) by mouth every 6 (six) hours as needed. 20 tablet 0  ? ?No facility-administered medications prior to visit.  ? ? ?PAST MEDICAL HISTORY: ?Past Medical History:  ?Diagnosis Date  ? 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 09/25/2015 09/25/2015  ? Aspiration pneumonia (HCC) 01/17/2016  ? Bilateral dry eyes   ? Bile peritonitis due to gastric tube dislodgement 12/03/2015  ? BPH (benign prostatic hyperplasia)   ? Central pterygium of right eye   ? Chronic respiratory failure with hypoxia (HCC)   ? Coronary artery disease 1997, 2009  ? bare mental stent right coronart artery, occlusive followed by Dr. Garnette Scheuermann in [place]  ? Degenerative disc disease   ? LOWER BACK  ? Dementia (HCC)   ? Dysarthria 04/25/2017  ? Dysphonia   ? ED (erectile dysfunction)   ? 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 Gastric outlet obstruction 11/24/2015  ? Gastroesophageal reflux disease   ? Hyperlipidemia   ? Hyperopia of both eyes with astigmatism and presbyopia   ? Hypertension   ? Ischemic right index finger at tip 12/09/2015  ? Nuclear senile cataract   ? [facility] in [place]  ? Peptic ulcer   ? Pneumatosis of intestines 12/11/2015  ? Pneumonia   ? HISTORY OF IN CHILDHOOD  ? Posterior vitreous detachment 10/30/2012  ? Pyloric stricture 11/24/2015  ? Salzmann's nodular dystrophy of left eye   ? SBO (small bowel obstruction) (HCC) 01/20/2016  ? Urinary hesitancy   ? Wears glasses   ? ? ?PAST SURGICAL HISTORY: ?Past Surgical History:  ?Procedure Laterality Date  ? CARDIAC CATHETERIZATION    ? CHOLECYSTECTOMY    ? ESOPHAGOGASTRODUODENOSCOPY N/A 12/01/2015  ? Procedure: ESOPHAGOGASTRODUODENOSCOPY (EGD) BALLOON DILATION OF DUODENAL STRICTURE;  Surgeon: Karie Soda, MD;  Location:  [facility];  Service: General;  Laterality: N/A;  ? ESOPHAGOGASTRODUODENOSCOPY N/A 12/14/2015  ? Procedure: ESOPHAGOGASTRODUODENOSCOPY (EGD);  Surgeon: Vida Rigger, MD;  Location: [facility];  Service: Endoscopy;  Laterality: N/A;  Patient has tracheostomy  ? ESOPHAGOGASTRODUODENOSCOPY (EGD) WITH PROPOFOL N/A 07/01/2015  ? Procedure: ESOPHAGOGASTRODUODENOSCOPY (EGD) WITH PROPOFOL;  Surgeon: Willis Modena, MD;  [facility];  Service: Endoscopy;  Laterality: N/A;  ? ESOPHAGOGASTRODUODENOSCOPY (EGD) WITH PROPOFOL N/A 11/23/2015  ? Procedure: ESOPHAGOGASTRODUODENOSCOPY (EGD) WITH PROPOFOL;  Surgeon: Willis Modena, MD;  Location: [facility];  Service: Endoscopy;  Laterality: N/A;  may need to intubate  ? EYE SURGERY    ? growth on right eye removed   ? HIP ARTHROPLASTY Left 07/07/2020  ?  Procedure: ARTHROPLASTY BIPOLAR HIP (HEMIARTHROPLASTY);  Surgeon: Teryl LucyLandau, Joshua, MD;  [facility];  Service: Orthopedics;  Laterality: Left;  ? IR GENERIC HISTORICAL  12/13/2015  ? IR REPLC DUODEN/JEJUNO TUBE PERCUT W/FLUORO 12/13/2015 Jolaine ClickArthur Hoss, MD [facility]  ? IR GENERIC HISTORICAL  02/03/2016  ? IR REPLC GASTRO/COLONIC TUBE PERCUT W/FLUORO 02/03/2016 Berdine DanceMichael Shick, MD [facility]  ? IR GENERIC HISTORICAL  02/04/2016  ? IR GASTR TUBE CONVERT GASTR-JEJ PER W/FL MOD SED 02/04/2016 Malachy MoanHeath McCullough, MD [facility]  ? IR GENERIC HISTORICAL  02/25/2016  ? IR SINUS/FIST TUBE CHK-NON GI 02/25/2016 Irish LackGlenn Yamagata, MD [facility]  ? IR GENERIC HISTORICAL  03/18/2016  ? IR CM INJ ANY COLONIC TUBE W/FLUORO 03/18/2016 Simonne ComeJohn Watts, MD [facility]  ? IR GENERIC HISTORICAL  03/18/2016  ? IR GJ TUBE CHANGE 03/18/2016 Simonne ComeJohn Watts, MD [facility]  ? IR GENERIC HISTORICAL  03/22/2016  ? IR CM INJ ANY COLONIC TUBE W/FLUORO 03/22/2016 Simonne ComeJohn Watts, MD [facility]  ? IR GENERIC HISTORICAL  04/13/2016  ? IR CM INJ ANY COLONIC TUBE W/FLUORO 04/13/2016 Richarda OverlieAdam Henn, MD [facility]  ? LAPAROSCOPIC GASTROSTOMY N/A 12/01/2015  ? Procedure:  LAPAROSCOPIC PLACEMENT OF FEEDING JEJUNOSTOMY AND GASTROSTOMY TUBE;  Surgeon: Karie SodaSteven Gross, MD;  Location: [facility];  Service: General;  Laterality: N/A;  ? LAPAROSCOPY N/A 12/03/2015  ? Procedure: LAPAROSCOPY DIAGNO

## 2021-05-27 NOTE — Telephone Encounter (Signed)
Updated med list to reflect changes.

## 2021-06-21 DIAGNOSIS — I10 Essential (primary) hypertension: Secondary | ICD-10-CM | POA: Diagnosis not present

## 2021-06-21 DIAGNOSIS — E7800 Pure hypercholesterolemia, unspecified: Secondary | ICD-10-CM | POA: Diagnosis not present

## 2021-06-21 DIAGNOSIS — K219 Gastro-esophageal reflux disease without esophagitis: Secondary | ICD-10-CM | POA: Diagnosis not present

## 2021-06-21 DIAGNOSIS — I2510 Atherosclerotic heart disease of native coronary artery without angina pectoris: Secondary | ICD-10-CM | POA: Diagnosis not present

## 2021-07-20 DIAGNOSIS — I2510 Atherosclerotic heart disease of native coronary artery without angina pectoris: Secondary | ICD-10-CM | POA: Diagnosis not present

## 2021-07-20 DIAGNOSIS — E7800 Pure hypercholesterolemia, unspecified: Secondary | ICD-10-CM | POA: Diagnosis not present

## 2021-07-20 DIAGNOSIS — K219 Gastro-esophageal reflux disease without esophagitis: Secondary | ICD-10-CM | POA: Diagnosis not present

## 2021-07-20 DIAGNOSIS — I10 Essential (primary) hypertension: Secondary | ICD-10-CM | POA: Diagnosis not present

## 2021-08-10 ENCOUNTER — Other Ambulatory Visit: Payer: Self-pay | Admitting: Internal Medicine

## 2021-08-10 ENCOUNTER — Ambulatory Visit
Admission: RE | Admit: 2021-08-10 | Discharge: 2021-08-10 | Disposition: A | Discharge status: Home / Self Care | Payer: Medicare Other | Source: Ambulatory Visit | Attending: Internal Medicine | Admitting: Internal Medicine

## 2021-08-10 DIAGNOSIS — Z03818 Encounter for observation for suspected exposure to other biological agents ruled out: Secondary | ICD-10-CM | POA: Diagnosis not present

## 2021-08-10 DIAGNOSIS — R062 Wheezing: Secondary | ICD-10-CM

## 2021-08-10 DIAGNOSIS — R0602 Shortness of breath: Secondary | ICD-10-CM | POA: Diagnosis not present

## 2021-08-10 DIAGNOSIS — J069 Acute upper respiratory infection, unspecified: Secondary | ICD-10-CM | POA: Diagnosis not present

## 2021-08-10 DIAGNOSIS — Q676 Pectus excavatum: Secondary | ICD-10-CM | POA: Diagnosis not present

## 2021-08-17 ENCOUNTER — Other Ambulatory Visit: Payer: Self-pay | Admitting: Neurology

## 2021-08-18 NOTE — Telephone Encounter (Signed)
Rx refilled. Pt to follow up with PCP for further refills.

## 2021-08-19 DIAGNOSIS — I10 Essential (primary) hypertension: Secondary | ICD-10-CM | POA: Diagnosis not present

## 2021-08-19 DIAGNOSIS — K219 Gastro-esophageal reflux disease without esophagitis: Secondary | ICD-10-CM | POA: Diagnosis not present

## 2021-08-19 DIAGNOSIS — E7800 Pure hypercholesterolemia, unspecified: Secondary | ICD-10-CM | POA: Diagnosis not present

## 2021-08-19 DIAGNOSIS — I2510 Atherosclerotic heart disease of native coronary artery without angina pectoris: Secondary | ICD-10-CM | POA: Diagnosis not present

## 2021-09-14 ENCOUNTER — Observation Stay (HOSPITAL_COMMUNITY)
Admission: EM | Admit: 2021-09-14 | Discharge: 2021-09-15 | Disposition: A | Discharge status: Home / Self Care | Payer: Medicare Other | Attending: Internal Medicine | Admitting: Internal Medicine

## 2021-09-14 ENCOUNTER — Emergency Department (HOSPITAL_COMMUNITY): Payer: Medicare Other

## 2021-09-14 ENCOUNTER — Encounter (HOSPITAL_COMMUNITY): Payer: Self-pay

## 2021-09-14 DIAGNOSIS — R0602 Shortness of breath: Secondary | ICD-10-CM

## 2021-09-14 DIAGNOSIS — Z743 Need for continuous supervision: Secondary | ICD-10-CM | POA: Diagnosis not present

## 2021-09-14 DIAGNOSIS — Z79899 Other long term (current) drug therapy: Secondary | ICD-10-CM | POA: Insufficient documentation

## 2021-09-14 DIAGNOSIS — J449 Chronic obstructive pulmonary disease, unspecified: Secondary | ICD-10-CM | POA: Diagnosis not present

## 2021-09-14 DIAGNOSIS — I130 Hypertensive heart and chronic kidney disease with heart failure and stage 1 through stage 4 chronic kidney disease, or unspecified chronic kidney disease: Secondary | ICD-10-CM | POA: Insufficient documentation

## 2021-09-14 DIAGNOSIS — Z20822 Contact with and (suspected) exposure to covid-19: Secondary | ICD-10-CM | POA: Insufficient documentation

## 2021-09-14 DIAGNOSIS — N1831 Chronic kidney disease, stage 3a: Secondary | ICD-10-CM | POA: Diagnosis present

## 2021-09-14 DIAGNOSIS — I2510 Atherosclerotic heart disease of native coronary artery without angina pectoris: Secondary | ICD-10-CM | POA: Diagnosis present

## 2021-09-14 DIAGNOSIS — J9601 Acute respiratory failure with hypoxia: Secondary | ICD-10-CM | POA: Diagnosis not present

## 2021-09-14 DIAGNOSIS — K219 Gastro-esophageal reflux disease without esophagitis: Secondary | ICD-10-CM | POA: Diagnosis present

## 2021-09-14 DIAGNOSIS — J189 Pneumonia, unspecified organism: Principal | ICD-10-CM | POA: Diagnosis present

## 2021-09-14 DIAGNOSIS — N400 Enlarged prostate without lower urinary tract symptoms: Secondary | ICD-10-CM | POA: Diagnosis present

## 2021-09-14 DIAGNOSIS — I5022 Chronic systolic (congestive) heart failure: Secondary | ICD-10-CM | POA: Diagnosis not present

## 2021-09-14 DIAGNOSIS — R0600 Dyspnea, unspecified: Secondary | ICD-10-CM | POA: Diagnosis not present

## 2021-09-14 DIAGNOSIS — R600 Localized edema: Secondary | ICD-10-CM | POA: Diagnosis not present

## 2021-09-14 DIAGNOSIS — E785 Hyperlipidemia, unspecified: Secondary | ICD-10-CM | POA: Diagnosis present

## 2021-09-14 DIAGNOSIS — F0390 Unspecified dementia without behavioral disturbance: Secondary | ICD-10-CM | POA: Diagnosis not present

## 2021-09-14 DIAGNOSIS — I5020 Unspecified systolic (congestive) heart failure: Secondary | ICD-10-CM

## 2021-09-14 DIAGNOSIS — R739 Hyperglycemia, unspecified: Secondary | ICD-10-CM | POA: Diagnosis not present

## 2021-09-14 DIAGNOSIS — R609 Edema, unspecified: Secondary | ICD-10-CM | POA: Diagnosis not present

## 2021-09-14 DIAGNOSIS — R6889 Other general symptoms and signs: Secondary | ICD-10-CM | POA: Diagnosis not present

## 2021-09-14 DIAGNOSIS — R0689 Other abnormalities of breathing: Secondary | ICD-10-CM | POA: Diagnosis not present

## 2021-09-14 DIAGNOSIS — M7989 Other specified soft tissue disorders: Secondary | ICD-10-CM

## 2021-09-14 DIAGNOSIS — R001 Bradycardia, unspecified: Secondary | ICD-10-CM | POA: Diagnosis not present

## 2021-09-14 DIAGNOSIS — I10 Essential (primary) hypertension: Secondary | ICD-10-CM | POA: Diagnosis present

## 2021-09-14 DIAGNOSIS — I5021 Acute systolic (congestive) heart failure: Secondary | ICD-10-CM

## 2021-09-14 LAB — RESPIRATORY PANEL BY PCR

## 2021-09-14 LAB — COMPREHENSIVE METABOLIC PANEL
ALT: 13 U/L (ref 0–44)
AST: 21 U/L (ref 15–41)
Albumin: 3.4 g/dL — ABNORMAL LOW (ref 3.5–5.0)
Alkaline Phosphatase: 87 U/L (ref 38–126)
Anion gap: 9 (ref 5–15)
BUN: 23 mg/dL (ref 8–23)
CO2: 23 mmol/L (ref 22–32)
Calcium: 8.8 mg/dL — ABNORMAL LOW (ref 8.9–10.3)
Chloride: 106 mmol/L (ref 98–111)
Creatinine, Ser: 1.34 mg/dL — ABNORMAL HIGH (ref 0.61–1.24)
GFR, Estimated: 53 mL/min — ABNORMAL LOW (ref >60)
Glucose, Bld: 95 mg/dL (ref 70–99)
Potassium: 4.1 mmol/L (ref 3.5–5.1)
Sodium: 138 mmol/L (ref 135–145)
Total Bilirubin: 0.5 mg/dL (ref 0.3–1.2)
Total Protein: 7.5 g/dL (ref 6.5–8.1)

## 2021-09-14 LAB — CBC WITH DIFFERENTIAL/PLATELET
Abs Immature Granulocytes: 0.06 10*3/uL (ref 0.00–0.07)
Basophils Absolute: 0.1 10*3/uL (ref 0.0–0.1)
Basophils Relative: 0 %
Eosinophils Absolute: 0 10*3/uL (ref 0.0–0.5)
Eosinophils Relative: 0 %
HCT: 42.9 % (ref 39.0–52.0)
Hemoglobin: 13.9 g/dL (ref 13.0–17.0)
Immature Granulocytes: 0 %
Lymphocytes Relative: 13 %
Lymphs Abs: 1.7 10*3/uL (ref 0.7–4.0)
MCH: 32.4 pg (ref 26.0–34.0)
MCHC: 32.4 g/dL (ref 30.0–36.0)
MCV: 100 fL (ref 80.0–100.0)
Monocytes Absolute: 1.7 10*3/uL — ABNORMAL HIGH (ref 0.1–1.0)
Monocytes Relative: 13 %
Neutro Abs: 9.8 10*3/uL — ABNORMAL HIGH (ref 1.7–7.7)
Neutrophils Relative %: 74 %
Platelets: 242 10*3/uL (ref 150–400)
RBC: 4.29 MIL/uL (ref 4.22–5.81)
RDW: 12.4 % (ref 11.5–15.5)
WBC: 13.4 10*3/uL — ABNORMAL HIGH (ref 4.0–10.5)
nRBC: 0 % (ref 0.0–0.2)

## 2021-09-14 LAB — MAGNESIUM: Magnesium: 1.8 mg/dL (ref 1.7–2.4)

## 2021-09-14 LAB — D-DIMER, QUANTITATIVE: D-Dimer, Quant: 1.16 ug/mL-FEU — ABNORMAL HIGH (ref 0.00–0.50)

## 2021-09-14 LAB — TROPONIN I (HIGH SENSITIVITY): Troponin I (High Sensitivity): 9 ng/L (ref <18)

## 2021-09-14 LAB — BRAIN NATRIURETIC PEPTIDE: B Natriuretic Peptide: 110.4 pg/mL — ABNORMAL HIGH (ref 0.0–100.0)

## 2021-09-14 LAB — SARS CORONAVIRUS 2 BY RT PCR: SARS Coronavirus 2 by RT PCR: NEGATIVE

## 2021-09-14 LAB — PROCALCITONIN: Procalcitonin: <0.1 ng/mL

## 2021-09-14 MED ORDER — CLONAZEPAM 0.5 MG PO TABS: 0.2500 mg | ORAL_TABLET | Freq: Once | ORAL | Status: DC

## 2021-09-14 MED ORDER — DIVALPROEX SODIUM 250 MG PO DR TAB
250.0000 mg | DELAYED_RELEASE_TABLET | Freq: Two times a day (BID) | ORAL | Status: DC
  Administered 2021-09-14 – 2021-09-15 (×2): 250 mg via ORAL
  Filled 2021-09-14 (×2): qty 1

## 2021-09-14 MED ORDER — ENOXAPARIN SODIUM 40 MG/0.4ML IJ SOSY
40.0000 mg | PREFILLED_SYRINGE | INTRAMUSCULAR | Status: DC
Start: 2021-09-14 — End: 2021-09-15
  Administered 2021-09-14: 40 mg via SUBCUTANEOUS
  Filled 2021-09-14: qty 0.4

## 2021-09-14 MED ORDER — ALPRAZOLAM 0.25 MG PO TABS
0.2500 mg | ORAL_TABLET | Freq: Once | ORAL | Status: AC
  Administered 2021-09-14: 0.25 mg via ORAL
  Filled 2021-09-14: qty 1

## 2021-09-14 MED ORDER — MEMANTINE HCL 10 MG PO TABS
10.0000 mg | ORAL_TABLET | Freq: Two times a day (BID) | ORAL | Status: DC
  Administered 2021-09-14 – 2021-09-15 (×2): 10 mg via ORAL
  Filled 2021-09-14 (×2): qty 1

## 2021-09-14 MED ORDER — SODIUM CHLORIDE 0.9% FLUSH
3.0000 mL | Freq: Two times a day (BID) | INTRAVENOUS | Status: DC
  Administered 2021-09-14 – 2021-09-15 (×2): 3 mL via INTRAVENOUS

## 2021-09-14 MED ORDER — ACETAMINOPHEN 650 MG RE SUPP: 650.0000 mg | Freq: Four times a day (QID) | RECTAL | Status: DC | PRN

## 2021-09-14 MED ORDER — SODIUM CHLORIDE 0.9 % IV SOLN
1.0000 g | Freq: Once | INTRAVENOUS | Status: AC
  Administered 2021-09-14: 1 g via INTRAVENOUS
  Filled 2021-09-14: qty 10

## 2021-09-14 MED ORDER — ACETAMINOPHEN 325 MG PO TABS: 650.0000 mg | ORAL_TABLET | Freq: Four times a day (QID) | ORAL | Status: DC | PRN

## 2021-09-14 MED ORDER — DIVALPROEX SODIUM 125 MG PO DR TAB: 125.0000 mg | DELAYED_RELEASE_TABLET | Freq: Two times a day (BID) | ORAL | Status: DC

## 2021-09-14 MED ORDER — SODIUM CHLORIDE 0.9 % IV SOLN
500.0000 mg | Freq: Once | INTRAVENOUS | Status: AC
  Administered 2021-09-14: 500 mg via INTRAVENOUS
  Filled 2021-09-14: qty 5

## 2021-09-14 MED ORDER — HALOPERIDOL LACTATE 5 MG/ML IJ SOLN
2.0000 mg | Freq: Four times a day (QID) | INTRAMUSCULAR | Status: DC | PRN
Start: 2021-09-14 — End: 2021-09-15

## 2021-09-14 NOTE — Assessment & Plan Note (Signed)
-   2+ bilateral lower extremity pitting edema which is reportedly new over past 2 weeks; possibly related to shortness of breath - Obtain lower extremity duplex to rule out DVT - Hold off on further diuresis at this time - Okay for placing TED hose for now

## 2021-09-14 NOTE — Assessment & Plan Note (Signed)
-   Follow-up med rec 

## 2021-09-14 NOTE — Assessment & Plan Note (Signed)
-  patient has history of CKD3a. Baseline creat ~ 1.2 - 1.4, eGFR 48-55

## 2021-09-14 NOTE — Assessment & Plan Note (Addendum)
-   Follow-up med rec -Can use labetalol or hydralazine as needed for now

## 2021-09-14 NOTE — ED Provider Notes (Addendum)
[facility]-EMERGENCY DEPT Provider Note   CSN: [id number] Arrival date & time: 09/14/21  1221     History  Chief Complaint  Patient presents with   Weakness   Leg Swelling    bilateral    [name] is a 81 y.o. male.  HPI Patient presents with his daughter provides much of the history.  Patient has dementia, and according to the daughter patient has no short-term memory at all.  He does answer questions about his current condition including denying pain, denying dyspnea, cannot provide any additional thoroughness.  Daughter notes that over the past 2 weeks patient has developed bilateral roughly symmetric lower extremity swelling, and apparently dyspnea.  Patient has not complained about pain to his daughter.  Daughter obtained over-the-counter diuretics, but these have not changed his condition.  He has known history of heart failure does have a history of CAD.  Level 5 caveat secondary to dementia    Home Medications Prior to Admission medications   Medication Sig Start Date End Date Taking? Authorizing Provider  cholestyramine (QUESTRAN) 4 GM/DOSE powder Take 1 g by mouth daily. 03/12/20   [provider]  divalproex (DEPAKOTE) 125 MG DR tablet TAKE TWO TABLETS BY MOUTH TWICE DAILY 04/19/21   Glean Salvo, NP  memantine (NAMENDA) 10 MG tablet TAKE ONE TABLET BY MOUTH TWICE DAILY 08/18/21   Levert Feinstein, MD  Multiple Vitamin (MULTI-VITAMINS) TABS Take 1 tablet by mouth daily.    [provider]  nitroGLYCERIN (NITROSTAT) 0.4 MG SL tablet Place 1 tablet (0.4 mg total) under the tongue every 5 (five) minutes as needed for chest pain. Patient not taking: Reported on 05/27/2021 02/26/16   Jerre Simon, PA  pantoprazole (PROTONIX) 40 MG tablet Take 40 mg by mouth daily. 10/19/17   [provider]  pravastatin (PRAVACHOL) 40 MG tablet Take 40 mg by mouth daily. 09/06/17   [provider]      Allergies    Alfuzosin, Flomax  [tamsulosin hcl], Lisinopril, Lorazepam, and Uroxatral [alfuzosin hcl er]    Review of Systems   Review of Systems  Unable to perform ROS: Dementia    Physical Exam Updated Vital Signs BP (!) 177/88   Pulse (!) 48   Temp 98.7 F (37.1 C) (Oral)   Resp 15   SpO2 90%  Physical Exam Vitals and nursing note reviewed.  Constitutional:      General: He is not in acute distress.    Appearance: He is well-developed.  HENT:     Head: Normocephalic and atraumatic.  Eyes:     Conjunctiva/sclera: Conjunctivae normal.  Cardiovascular:     Rate and Rhythm: Normal rate and regular rhythm.  Pulmonary:     Effort: Pulmonary effort is normal. No respiratory distress.     Breath sounds: No stridor.  Abdominal:     General: There is no distension.  Musculoskeletal:     Right lower leg: Edema present.     Left lower leg: Edema present.  Skin:    General: Skin is warm and dry.  Neurological:     Mental Status: He is alert.     Cranial Nerves: No dysarthria.     Motor: Atrophy present. No tremor.     Coordination: Coordination normal.  Psychiatric:        Cognition and Memory: Cognition is impaired. Memory is impaired.     ED Results / Procedures / Treatments   Labs (all labs ordered are listed, but  only abnormal results are displayed) Labs Reviewed  CBC WITH DIFFERENTIAL/PLATELET - Abnormal; Notable for the following components:      Result Value   WBC 13.4 (*)    Neutro Abs 9.8 (*)    Monocytes Absolute 1.7 (*)    All other components within normal limits  COMPREHENSIVE METABOLIC PANEL  BRAIN NATRIURETIC PEPTIDE  MAGNESIUM  TROPONIN I (HIGH SENSITIVITY)  TROPONIN I (HIGH SENSITIVITY)    EKG EKG Interpretation  Date/Time:  Tuesday September 14 2021 14:52:51 EDT Ventricular Rate:  83 PR Interval:  172 QRS Duration: 86 QT Interval:  388 QTC Calculation: 456 R Axis:   75 Text Interpretation: Sinus rhythm Artifact Premature atrial complexes Abnormal ECG Confirmed by  Gerhard Munch [phone]) on 09/14/2021 3:28:25 PM  Radiology DG Chest Portable 1 View  Result Date: 09/14/2021 CLINICAL DATA:  Dyspnea, edema in both lower extremities EXAM: PORTABLE CHEST 1 VIEW COMPARISON:  Previous studies including the examination of 08/10/2021 FINDINGS: Cardiac size is within normal limits. There are no signs of pulmonary edema. Low position of diaphragms may suggest COPD. Linear densities are seen in the medial right lower lung field. There is no focal consolidation. There is no pleural effusion or pneumothorax. IMPRESSION: COPD. Small linear densities in medial right lower lung field may suggest crowding of normal bronchovascular structures or subsegmental atelectasis. There are no signs of alveolar pulmonary edema. Electronically Signed   By: Ernie Avena M.D.   On: 09/14/2021 13:31    Procedures Procedures    Medications Ordered in ED Medications - No data to display  ED Course/ Medical Decision Making/ A&P Clinical Course as of 09/14/21 1443  Tue Sep 14, 2021  1423 DG Chest Portable 1 View [AA]    Clinical Course User Index [AA] Marita Kansas, PA-C                           This patient with a Hx of CAD, dementia presents to the ED for concern of apnea, lower extremity edema, this involves an extensive number of treatment options, and is a complaint that carries with it a high risk of complications and morbidity.    The differential diagnosis includes hepatic, renal, cardiac dysfunction, malnutrition, hypoalbuminemia, infection   Social Determinants of Health:  Dementia, age  Additional history obtained:  Additional history and/or information obtained from daughter at bedside, notable for HPI details as above  Echocardiogram from 2018 included below: Study Conclusions   - Left ventricle: The cavity size was normal. Wall thickness was    increased in a pattern of mild LVH. Systolic function was normal.    The estimated ejection fraction was in the  range of 50% to 55%.    Diffuse hypokinesis. Doppler parameters are consistent with    abnormal left ventricular relaxation (grade 1 diastolic    dysfunction).  - Aortic valve: There was mild regurgitation.   Impressions:   - Extremely limited; definity used; normal LV systolic function;    grade 1 diastolic function; mild AI.     After the initial evaluation, orders, including: X-ray labs ECG were initiated.   Patient placed on Cardiac and Pulse-Oximetry Monitors. The patient was maintained on a cardiac monitor.  The cardiac monitored showed an rhythm of 50 sinus bradycardia abnormal The patient was also maintained on pulse oximetry. The readings were typically 90% room air abnormal   On repeat evaluation of the patient stayed the same  Lab Tests:  I  personally interpreted labs.  The pertinent results include: Leukocytosis, slight elevation in creatinine from baseline  Imaging Studies ordered:  I independently visualized and interpreted imaging which showed changes consistent with pneumonia right medial field I agree with the radiologist interpretation   Dispostion / Final MDM:  After consideration of the diagnostic results and the patient's response to treatment, this adult male presents with swelling, dyspnea, is found have new oxygen requirement, though he does have BNP of 100, past was elevated creatinine, and his symptoms are likely multifactorial.  However, patient also found to have new oxygen requirement, and x-ray is concerning for possible pneumonia.  No obvious pulmonary congestion on x-ray.  PE considered, but patient has negligible risk profile and with elevated creatinine, leukocytosis, other strong etiology consideration for symptoms, CT angiography not currently indicated.  With new oxygen requirement patient was started on antibiotics, admitted for further monitoring, management, including consideration of echocardiogram and is swelling, elevated BNP and last  echocardiogram of 2018.   Final Clinical Impression(s) / ED Diagnoses Final diagnoses:  Community acquired pneumonia of right middle lobe of lung  Leg swelling     Gerhard Munch, MD 09/14/21 1653    Gerhard Munch, MD 09/14/21 1654

## 2021-09-14 NOTE — Hospital Course (Addendum)
[name] is an 81 yo male with PMH dementia, BPH, CAD, DDD, GERD, HLD, HTN who presented with worsening SOB when ambulating and LE edema.  History is provided by patient's daughter who is bedside in the ER as patient cannot provide collateral information due to underlying severe dementia.  She states that he developed lower extremity edema in both legs approximately 2 weeks ago.  After attempt of over-the-counter diuretic, there was no improvement.  He also has developed worsening wheezing, shortness of breath, and cough productive of white sputum which is new for him in regards to productive cough.  He typically walks with a walker to get around the house at home and has 24/7 care.  He does spend most of his time at home sitting on the couch. His daughter also endorses that he has felt a little "warm" recently.  CXR obtained in the ER showed no overt volume overload including pulmonary edema or effusions.  There were also no focal infiltrates to suggest overt pneumonia.  Patient is a non-smoker. BNP very mildly elevated, 110.  WBC 13.4.  Creatinine 1.34

## 2021-09-14 NOTE — H&P (Signed)
History and Physical    [name]  LOV:[id number]  DOB: 20-Nov-1940  DOA: 09/14/2021  PCP: Lavone Orn, MD Patient coming from: Home  Chief Complaint: SOB, LE swelling  HPI:  [name] is an 81 yo male with PMH dementia, BPH, CAD, DDD, GERD, HLD, HTN who presented with worsening SOB when ambulating and LE edema.  History is provided by patient's daughter who is bedside in the ER as patient cannot provide collateral information due to underlying severe dementia.  She states that he developed lower extremity edema in both legs approximately 2 weeks ago.  After attempt of over-the-counter diuretic, there was no improvement.  He also has developed worsening wheezing, shortness of breath, and cough productive of white sputum which is new for him in regards to productive cough.  He typically walks with a walker to get around the house at home and has 24/7 care.  He does spend most of his time at home sitting on the couch. His daughter also endorses that he has felt a little "warm" recently.  CXR obtained in the ER showed no overt volume overload including pulmonary edema or effusions.  There were also no focal infiltrates to suggest overt pneumonia.  Patient is a non-smoker. BNP very mildly elevated, 110.  WBC 13.4.  Creatinine 1.34  I have personally briefly reviewed patient's old medical records in [facility] and discussed patient with the ER provider when appropriate/indicated.  Assessment and Plan: * Shortness of breath - No clear etiology at this time.  This appears to be a newer problem over the prior 2 weeks with associated cough productive of white sputum and wheezing.  Lower extremity edema may also be related but still unclear -Check COVID and RVP swabs - Check D-dimer.  If elevated will pursue CTA chest as CXR unimpressive at this time -Obtain new echo -Check procalcitonin and continue antibiotics for now; if negative will discontinue most likely  Lower extremity  edema - 2+ bilateral lower extremity pitting edema which is reportedly new over past 2 weeks; possibly related to shortness of breath - Obtain lower extremity duplex to rule out DVT - Hold off on further diuresis at this time - Okay for placing TED hose for now  Acute respiratory failure with hypoxia ([place]) - Continue oxygen for now and wean as able.  Patient had oxygen desaturation down to around 85% on room air -See above work-up  Dementia [facility]) - Daughter reports high risk for sundowning - Family plans to stay the night - Continue Namenda and Depakote - Haldol PRN  Chronic kidney disease, stage 3a ([place]) - patient has history of CKD3a. Baseline creat ~ 1.2 - 1.4, eGFR 48-55   Gastroesophageal reflux disease - Follow-up med rec  BPH (benign prostatic hyperplasia) - Follow-up med rec  Hyperlipidemia - Follow-up med rec  Essential hypertension, benign - Follow-up med rec -Can use labetalol or hydralazine as needed for now  Coronary atherosclerosis of native coronary artery - Follow-up med rec    Code Status:     Code Status: DNR  DVT Prophylaxis:   enoxaparin (LOVENOX) injection 40 mg Start: 09/14/21 2200 Place TED hose Start: 09/14/21 1735   Anticipated disposition is to: Home  History: Past Medical History:  Diagnosis Date   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 [facility] in [place]   Peptic ulcer    Pneumatosis of intestines 12/11/2015   Pneumonia    HISTORY OF IN CHILDHOOD   Posterior vitreous detachment 10/30/2012   Pyloric stricture 11/24/2015   Salzmann's nodular dystrophy of left eye    SBO (small bowel obstruction) ([place]) 01/20/2016   Urinary hesitancy    Wears glasses     Past Surgical History:  Procedure Laterality Date   CARDIAC CATHETERIZATION     CHOLECYSTECTOMY     ESOPHAGOGASTRODUODENOSCOPY N/A 12/01/2015   Procedure: ESOPHAGOGASTRODUODENOSCOPY (EGD) BALLOON DILATION OF DUODENAL STRICTURE;  Surgeon: Michael Boston, MD;  Location: [facility];  Service: General;  Laterality: N/A;   ESOPHAGOGASTRODUODENOSCOPY N/A 12/14/2015   Procedure: ESOPHAGOGASTRODUODENOSCOPY (EGD);  Surgeon: Clarene Essex, MD;  Location: [facility];  Service: Endoscopy;  Laterality: N/A;  Patient has tracheostomy   ESOPHAGOGASTRODUODENOSCOPY (EGD) WITH PROPOFOL N/A 07/01/2015   Procedure: ESOPHAGOGASTRODUODENOSCOPY (EGD) WITH PROPOFOL;  Surgeon: Arta Silence, MD;  Location: [facility];  Service: Endoscopy;  Laterality: N/A;   ESOPHAGOGASTRODUODENOSCOPY (EGD) WITH PROPOFOL N/A 11/23/2015   Procedure: ESOPHAGOGASTRODUODENOSCOPY (EGD) WITH PROPOFOL;  Surgeon: Arta Silence, MD;  Location[facility]COPY;  Service: Endoscopy;  Laterality: N/A;  may need to intubate   EYE SURGERY     growth on right eye removed    HIP ARTHROPLASTY Left 07/07/2020   Procedure: ARTHROPLASTY BIPOLAR HIP (HEMIARTHROPLASTY);  Surgeon: Marchia Bond, MD;  Location: [facility];  Service: Orthopedics;  Laterality: Left;   IR GENERIC HISTORICAL  12/13/2015   IR REPLC DUODEN/JEJUNO TUBE PERCUT W/FLUORO 12/13/2015  Marybelle Killings, MD [facility]   IR GENERIC HISTORICAL  02/03/2016   IR REPLC GASTRO/COLONIC TUBE PERCUT W/FLUORO 02/03/2016 Greggory Keen, MD [facility]   IR GENERIC HISTORICAL  02/04/2016   IR GASTR TUBE CONVERT GASTR-JEJ PER W/FL MOD SED 02/04/2016 Jacqulynn Cadet, MD [facility]   IR GENERIC HISTORICAL  02/25/2016   IR SINUS/FIST TUBE CHK-NON GI 02/25/2016 Aletta Edouard, MD [facility]   IR GENERIC HISTORICAL  03/18/2016   IR CM INJ ANY COLONIC TUBE W/FLUORO 03/18/2016 Sandi Mariscal, MD [facility]   IR GENERIC HISTORICAL  03/18/2016   IR GJ TUBE CHANGE 03/18/2016 Sandi Mariscal, MD [facility]   IR GENERIC HISTORICAL  03/22/2016   IR CM INJ ANY COLONIC TUBE W/FLUORO 03/22/2016 Sandi Mariscal, MD [facility]   IR GENERIC HISTORICAL  04/13/2016   IR CM INJ ANY COLONIC TUBE W/FLUORO 04/13/2016 Markus Daft, MD [facility]   LAPAROSCOPIC GASTROSTOMY N/A 12/01/2015   Procedure: LAPAROSCOPIC PLACEMENT OF FEEDING JEJUNOSTOMY AND GASTROSTOMY TUBE;  Surgeon: Michael Boston, MD;  Location: [facility];  Service: General;  Laterality: N/A;   LAPAROSCOPY N/A 12/03/2015   Procedure: LAPAROSCOPY DIAGNOSTIC, OMENTOPEXY, JEJUNOSTOMY, [place] OUT;  Surgeon: Michael Boston, MD;  Location: [facility];  Service: General;  Laterality: N/A;   LAPAROTOMY N/A 02/06/2016   Procedure: EXPLORATORY LAPAROTOMY, LYSIS OF ADHESIONS, SEGMENTAL SMALL BOWEL RESECTION, GASTROJEJUNOSTOMY;  Surgeon: Autumn Messing III, MD;  Location: [facility];  Service: General;  Laterality: N/A;   STENT TO HEART     2 1997 AND 1 [place] N/A 09/25/2015   Procedure: XI ROBOTIC ANTERIOR AND POSTERIOR VAGOTOMY, [place], OMENTOPEXY  UPPER ENDOSCOPY;  Surgeon: Michael Boston, MD;  Location: [facility];  Service: General;  Laterality: N/A;     reports that he quit smoking about 26 years ago. His smoking use included cigarettes. He has a 40.00 pack-year smoking history. He has never used smokeless  tobacco. He reports that he does not drink alcohol and does not use drugs.  Allergies  Allergen Reactions   Alfuzosin     Other reaction(s): leg weakness   Flomax [Tamsulosin Hcl] Other (See Comments)    Leg weakness   Lisinopril Cough   Lorazepam Other (See Comments)    "i don't like it"  Prefers Xanax or valium   Uroxatral [Alfuzosin Hcl Er] Other (See Comments)    Leg weakness    Family History  Problem Relation Age of Onset   Heart attack Mother    Home Medications: Prior to Admission medications   Medication Sig Start Date End Date Taking? Authorizing Provider  cholestyramine (QUESTRAN) 4 GM/DOSE powder Take 1 g by mouth daily. 03/12/20   [provider]  divalproex (DEPAKOTE) 125 MG DR tablet TAKE TWO TABLETS BY MOUTH TWICE DAILY 04/19/21   Suzzanne Cloud, NP  memantine (NAMENDA) 10 MG tablet TAKE ONE TABLET BY MOUTH TWICE DAILY 08/18/21   Marcial Pacas, MD  Multiple Vitamin (MULTI-VITAMINS) TABS Take 1 tablet by mouth daily.    [provider]  nitroGLYCERIN (NITROSTAT) 0.4 MG SL tablet Place 1 tablet (0.4 mg total) under the tongue every 5 (five) minutes as needed for chest pain. Patient not taking: Reported on 05/27/2021 02/26/16   Kalman Drape, PA  pantoprazole (PROTONIX) 40 MG tablet Take 40 mg by mouth daily. 10/19/17   [provider]  pravastatin (PRAVACHOL) 40 MG tablet Take 40 mg by mouth daily. 09/06/17   [provider]    Review of Systems:  Review of Systems  Unable to perform ROS: Mental acuity    Physical Exam:  Vitals:   09/14/21 1545 09/14/21 1630 09/14/21 1700 09/14/21 1715  BP: (!) 188/70 (!) 166/72 (!) 174/104 (!) 186/78  Pulse: 82 84 86 96  Resp: 16 16 17 17   Temp:    99.4 F (37.4 C)  TempSrc:      SpO2: 93% 93% 93% (!) 89%   Physical Exam Constitutional:      General: He is not in acute distress.    Appearance: Normal appearance. He is not ill-appearing.  HENT:     Head: Normocephalic and atraumatic.      Mouth/Throat:     Mouth: Mucous membranes are moist.  Eyes:     Extraocular Movements: Extraocular movements intact.  Cardiovascular:     Rate and Rhythm: Normal rate and regular rhythm.  Pulmonary:     Effort: Pulmonary effort is normal.     Breath sounds: Normal breath sounds. No wheezing or rhonchi.  Abdominal:     General: Bowel sounds are normal. There is no distension.     Palpations:  Abdomen is soft.     Tenderness: There is no abdominal tenderness.  Musculoskeletal:        General: Swelling present.     Comments: 2+ bilateral lower extremity edema to mid leg  Skin:    General: Skin is warm and dry.  Neurological:     Mental Status: He is alert. Mental status is at baseline.      Labs on Admission:  I have personally reviewed following labs and imaging studies Results for orders placed or performed during the hospital encounter of 09/14/21 (from the past 24 hour(s))  Brain natriuretic peptide     Status: Abnormal   Collection Time: 09/14/21  2:06 PM  Result Value Ref Range   B Natriuretic Peptide 110.4 (H) 0.0 - 100.0 pg/mL  CBC with Differential     Status: Abnormal   Collection Time: 09/14/21  2:15 PM  Result Value Ref Range   WBC 13.4 (H) 4.0 - 10.5 K/uL   RBC 4.29 4.22 - 5.81 MIL/uL   Hemoglobin 13.9 13.0 - 17.0 g/dL   HCT 42.9 39.0 - 52.0 %   MCV 100.0 80.0 - 100.0 fL   MCH 32.4 26.0 - 34.0 pg   MCHC 32.4 30.0 - 36.0 g/dL   RDW 12.4 11.5 - 15.5 %   Platelets 242 150 - 400 K/uL   nRBC 0.0 0.0 - 0.2 %   Neutrophils Relative % 74 %   Neutro Abs 9.8 (H) 1.7 - 7.7 K/uL   Lymphocytes Relative 13 %   Lymphs Abs 1.7 0.7 - 4.0 K/uL   Monocytes Relative 13 %   Monocytes Absolute 1.7 (H) 0.1 - 1.0 K/uL   Eosinophils Relative 0 %   Eosinophils Absolute 0.0 0.0 - 0.5 K/uL   Basophils Relative 0 %   Basophils Absolute 0.1 0.0 - 0.1 K/uL   Immature Granulocytes 0 %   Abs Immature Granulocytes 0.06 0.00 - 0.07 K/uL  Comprehensive metabolic panel     Status: Abnormal    Collection Time: 09/14/21  2:15 PM  Result Value Ref Range   Sodium 138 135 - 145 mmol/L   Potassium 4.1 3.5 - 5.1 mmol/L   Chloride 106 98 - 111 mmol/L   CO2 23 22 - 32 mmol/L   Glucose, Bld 95 70 - 99 mg/dL   BUN 23 8 - 23 mg/dL   Creatinine, Ser 1.34 (H) 0.61 - 1.24 mg/dL   Calcium 8.8 (L) 8.9 - 10.3 mg/dL   Total Protein 7.5 6.5 - 8.1 g/dL   Albumin 3.4 (L) 3.5 - 5.0 g/dL   AST 21 15 - 41 U/L   ALT 13 0 - 44 U/L   Alkaline Phosphatase 87 38 - 126 U/L   Total Bilirubin 0.5 0.3 - 1.2 mg/dL   GFR, Estimated 53 (L) >60 mL/min   Anion gap 9 5 - 15  Troponin I (High Sensitivity)     Status: None   Collection Time: 09/14/21  2:15 PM  Result Value Ref Range   Troponin I (High Sensitivity) 9 <18 ng/L  Magnesium     Status: None   Collection Time: 09/14/21  2:15 PM  Result Value Ref Range   Magnesium 1.8 1.7 - 2.4 mg/dL     Radiological Exams on Admission: DG Chest Portable 1 View  Result Date: 09/14/2021 CLINICAL DATA:  Dyspnea, edema in both lower extremities EXAM: PORTABLE CHEST 1 VIEW COMPARISON:  Previous studies including the examination of 08/10/2021 FINDINGS: Cardiac size is within normal  limits. There are no signs of pulmonary edema. Low position of diaphragms may suggest COPD. Linear densities are seen in the medial right lower lung field. There is no focal consolidation. There is no pleural effusion or pneumothorax. IMPRESSION: COPD. Small linear densities in medial right lower lung field may suggest crowding of normal bronchovascular structures or subsegmental atelectasis. There are no signs of alveolar pulmonary edema. Electronically Signed   By: Elmer Picker M.D.   On: 09/14/2021 13:31   DG Chest Portable 1 View  Final Result    VAS [place] LOWER EXTREMITY VENOUS (DVT)    (Results Pending)    Consults called:     EKG: Independently reviewed. NSR   Dwyane Dee, MD [facility] 09/14/2021, 5:38 PM

## 2021-09-14 NOTE — Assessment & Plan Note (Addendum)
-   No clear etiology initially on admission in terms of his SOB; CXR noted with no obvious infiltrates or signs of volume overload - covid and RVP negative - d-dimer mildly elevated and CTA chest pursued. This shows RLL infiltrate consistent with PNA and likely explains much of his SOB, cough - was given rocephin and azithro in ER on admission - will start on augmentin and doxy at this time and discharge with course to complete

## 2021-09-14 NOTE — Assessment & Plan Note (Signed)
-   Follow-up med rec

## 2021-09-14 NOTE — ED Triage Notes (Signed)
Pt arrived [facility] from home.   C/o swelling bilateral lower leg edema   Also c/o generalized weakness and diarrhea  EMS VS: [id number] HR-104 CBG-260 RR-28 91% RA   Hx: heart attack, dementia, kidney disease.

## 2021-09-14 NOTE — Assessment & Plan Note (Signed)
-   see CHF and PNA - able to be weaned to RA prior to discharge - CTA chest negative for PE

## 2021-09-14 NOTE — Assessment & Plan Note (Signed)
-   Daughter reports high risk for sundowning - Family plans to stay the night - Continue Namenda and Depakote - Haldol PRN

## 2021-09-14 NOTE — ED Provider Triage Note (Signed)
Emergency Medicine Provider Triage Evaluation Note  [name] , a 81 y.o. male  was evaluated in triage.  Patient with past medical history of CAD, CHF presents today for evaluation of lower extremity swelling bilaterally.  Patient has history of dementia and does not provide good history.  He denies shortness of breath, chest pain.  Per family via EMS symptoms have been ongoing for about a week.  On my evaluation patient's heart rate was mid 90s, and O2 sats on room air were in the low 90s.  Initially upon arrival patient was satting around 87%.  Per EMS patient was satting low 90s in route.   Review of Systems  Positive: As above Negative: As above  Physical Exam  BP (!) 155/73   Pulse 95   Temp 98.7 F (37.1 C) (Oral)   Resp 16   SpO2 (!) 87%  Gen:   Awake, no distress   Resp:  Normal effort  MSK:   Moves extremities without difficulty  Other:  Bilateral lower extremity pitting edema present.  Conversational dyspnea present (unsure if this is baseline)  Medical Decision Making  Medically screening exam initiated at 12:30 PM.  Appropriate orders placed.  ARSHAWN [name] was informed that the remainder of the evaluation will be completed by another provider, this initial triage assessment does not replace that evaluation, and the importance of remaining in the ED until their evaluation is complete.     Marita [place], PA-C 09/14/21 1233

## 2021-09-15 ENCOUNTER — Encounter (HOSPITAL_COMMUNITY): Payer: Self-pay | Admitting: Internal Medicine

## 2021-09-15 ENCOUNTER — Inpatient Hospital Stay (HOSPITAL_BASED_OUTPATIENT_CLINIC_OR_DEPARTMENT_OTHER): Payer: Medicare Other

## 2021-09-15 ENCOUNTER — Other Ambulatory Visit: Payer: Self-pay

## 2021-09-15 ENCOUNTER — Inpatient Hospital Stay (HOSPITAL_COMMUNITY): Payer: Medicare Other

## 2021-09-15 DIAGNOSIS — J9690 Respiratory failure, unspecified, unspecified whether with hypoxia or hypercapnia: Secondary | ICD-10-CM | POA: Diagnosis not present

## 2021-09-15 DIAGNOSIS — J189 Pneumonia, unspecified organism: Secondary | ICD-10-CM | POA: Diagnosis not present

## 2021-09-15 DIAGNOSIS — R600 Localized edema: Secondary | ICD-10-CM

## 2021-09-15 DIAGNOSIS — J9601 Acute respiratory failure with hypoxia: Secondary | ICD-10-CM | POA: Diagnosis not present

## 2021-09-15 DIAGNOSIS — M7989 Other specified soft tissue disorders: Secondary | ICD-10-CM

## 2021-09-15 DIAGNOSIS — I5022 Chronic systolic (congestive) heart failure: Secondary | ICD-10-CM

## 2021-09-15 DIAGNOSIS — I5021 Acute systolic (congestive) heart failure: Secondary | ICD-10-CM

## 2021-09-15 DIAGNOSIS — R0902 Hypoxemia: Secondary | ICD-10-CM | POA: Diagnosis not present

## 2021-09-15 DIAGNOSIS — J439 Emphysema, unspecified: Secondary | ICD-10-CM | POA: Diagnosis not present

## 2021-09-15 LAB — CBC WITH DIFFERENTIAL/PLATELET
Abs Immature Granulocytes: 0.08 10*3/uL — ABNORMAL HIGH (ref 0.00–0.07)
Basophils Absolute: 0 10*3/uL (ref 0.0–0.1)
Basophils Relative: 0 %
Eosinophils Absolute: 0 10*3/uL (ref 0.0–0.5)
Eosinophils Relative: 0 %
HCT: 34.4 % — ABNORMAL LOW (ref 39.0–52.0)
Hemoglobin: 11.3 g/dL — ABNORMAL LOW (ref 13.0–17.0)
Immature Granulocytes: 1 %
Lymphocytes Relative: 12 %
Lymphs Abs: 1.6 10*3/uL (ref 0.7–4.0)
MCH: 32.6 pg (ref 26.0–34.0)
MCHC: 32.8 g/dL (ref 30.0–36.0)
MCV: 99.1 fL (ref 80.0–100.0)
Monocytes Absolute: 1.8 10*3/uL — ABNORMAL HIGH (ref 0.1–1.0)
Monocytes Relative: 14 %
Neutro Abs: 9.6 10*3/uL — ABNORMAL HIGH (ref 1.7–7.7)
Neutrophils Relative %: 73 %
Platelets: 181 10*3/uL (ref 150–400)
RBC: 3.47 MIL/uL — ABNORMAL LOW (ref 4.22–5.81)
RDW: 12.3 % (ref 11.5–15.5)
WBC: 13.1 10*3/uL — ABNORMAL HIGH (ref 4.0–10.5)
nRBC: 0 % (ref 0.0–0.2)

## 2021-09-15 LAB — ECHOCARDIOGRAM COMPLETE
AR max vel: 2.93 cm2
AV Area VTI: 3.27 cm2
AV Area mean vel: 3.52 cm2
AV Mean grad: 2.5 mmHg
AV Peak grad: 6.2 mmHg
Ao pk vel: 1.24 m/s
Area-P 1/2: 3.99 cm2
Height: 72 in
MV VTI: 2.73 cm2
P 1/2 time: 616 msec
S' Lateral: 3.1 cm
Weight: 2699.2 oz

## 2021-09-15 LAB — BASIC METABOLIC PANEL
Anion gap: 8 (ref 5–15)
BUN: 20 mg/dL (ref 8–23)
CO2: 23 mmol/L (ref 22–32)
Calcium: 8.1 mg/dL — ABNORMAL LOW (ref 8.9–10.3)
Chloride: 110 mmol/L (ref 98–111)
Creatinine, Ser: 1.17 mg/dL (ref 0.61–1.24)
GFR, Estimated: >60 mL/min (ref >60)
Glucose, Bld: 106 mg/dL — ABNORMAL HIGH (ref 70–99)
Potassium: 4.3 mmol/L (ref 3.5–5.1)
Sodium: 141 mmol/L (ref 135–145)

## 2021-09-15 LAB — MAGNESIUM: Magnesium: 1.9 mg/dL (ref 1.7–2.4)

## 2021-09-15 LAB — TSH: TSH: 2.472 u[IU]/mL (ref 0.350–4.500)

## 2021-09-15 LAB — PROCALCITONIN: Procalcitonin: <0.1 ng/mL

## 2021-09-15 MED ORDER — AMOXICILLIN-POT CLAVULANATE 875-125 MG PO TABS
1.0000 | ORAL_TABLET | Freq: Two times a day (BID) | ORAL | Status: DC
  Administered 2021-09-15: 1 via ORAL
  Filled 2021-09-15: qty 1

## 2021-09-15 MED ORDER — AMOXICILLIN-POT CLAVULANATE 875-125 MG PO TABS: 1.0000 | ORAL_TABLET | Freq: Two times a day (BID) | ORAL | 0 refills | Status: AC

## 2021-09-15 MED ORDER — FUROSEMIDE 20 MG PO TABS: 20.0000 mg | ORAL_TABLET | Freq: Every day | ORAL | 2 refills | Status: DC

## 2021-09-15 MED ORDER — FUROSEMIDE 10 MG/ML IJ SOLN
40.0000 mg | Freq: Once | INTRAMUSCULAR | Status: AC
  Administered 2021-09-15: 40 mg via INTRAVENOUS
  Filled 2021-09-15: qty 4

## 2021-09-15 MED ORDER — DOXYCYCLINE HYCLATE 100 MG PO TABS: 100.0000 mg | ORAL_TABLET | Freq: Two times a day (BID) | ORAL | 0 refills | Status: AC

## 2021-09-15 MED ORDER — PERFLUTREN LIPID MICROSPHERE
1.0000 mL | INTRAVENOUS | Status: AC | PRN
  Administered 2021-09-15: 5 mL via INTRAVENOUS

## 2021-09-15 MED ORDER — SODIUM CHLORIDE (PF) 0.9 % IJ SOLN
INTRAMUSCULAR | Status: AC
  Filled 2021-09-15: qty 50

## 2021-09-15 MED ORDER — IOHEXOL 350 MG/ML SOLN
75.0000 mL | Freq: Once | INTRAVENOUS | Status: AC | PRN
  Administered 2021-09-15: 75 mL via INTRAVENOUS

## 2021-09-15 MED ORDER — DOXYCYCLINE HYCLATE 100 MG PO TABS
100.0000 mg | ORAL_TABLET | Freq: Two times a day (BID) | ORAL | Status: DC
  Administered 2021-09-15: 100 mg via ORAL
  Filled 2021-09-15: qty 1

## 2021-09-15 NOTE — Progress Notes (Signed)
SATURATION QUALIFICATIONS: (This note is used to comply with regulatory documentation for home oxygen)  Patient Saturations on Room Air at Rest = 92%  Patient Saturations on Room Air while Ambulating = 94%  Patient Saturations on 0 Liters of oxygen while Ambulating = 92% [place]

## 2021-09-15 NOTE — Plan of Care (Signed)
  Problem: Education: Goal: Knowledge of General Education information will improve Description Including pain rating scale, medication(s)/side effects and non-pharmacologic comfort measures Outcome: Progressing   Problem: Health Behavior/Discharge Planning: Goal: Ability to manage health-related needs will improve Outcome: Progressing   

## 2021-09-15 NOTE — TOC Initial Note (Signed)
Transition of Care [facility]) - Initial/Assessment Note    Patient Details  Name: [name] MRN: [id number] Date of Birth: 08-07-1940  Transition of Care [facility]) CM/SW Contact:    Golda Acre, RN Phone Number: 09/15/2021, 10:15 AM  Clinical Narrative:                  Transition of Care [facility]) Screening Note   Patient Details  Name: [name] Date of Birth: 10/10/1940   Transition of Care [facility]) CM/SW Contact:    Golda Acre, RN Phone Number: 09/15/2021, 10:15 AM    Transition of Care Department [facility]) has reviewed patient and no TOC needs have been identified at this time. We will continue to monitor patient advancement through interdisciplinary progression rounds. If new patient transition needs arise, please place a TOC consult.    Expected Discharge Plan: Home/Self Care Barriers to Discharge: Continued Medical Work up   Patient Goals and CMS Choice Patient states their goals for this hospitalization and ongoing recovery are:: to go home of course CMS Medicare.gov Compare Post Acute Care list provided to:: Patient Represenative (must comment) (daughter) Choice offered to / list presented to : Adult Children  Expected Discharge Plan and Services Expected Discharge Plan: Home/Self Care   Discharge Planning Services: CM Consult   Living arrangements for the past 2 months: Single Family Home                                      Prior Living Arrangements/Services Living arrangements for the past 2 months: Single Family Home Lives with:: Self, Adult Children Patient language and need for interpreter reviewed:: Yes Do you feel safe going back to the place where you live?: Yes      Need for Family Participation in Patient Care: Yes (Comment) (daughter lives with patient) Care giver support system in place?: Yes (comment)   Criminal Activity/Legal Involvement Pertinent to Current Situation/Hospitalization: No - Comment as needed  Activities  of Daily Living Home Assistive Devices/Equipment: Environmental consultant (specify type) ADL Screening (condition at time of admission) Patient's cognitive ability adequate to safely complete daily activities?: No Is the patient deaf or have difficulty hearing?: No Does the patient have difficulty seeing, even when wearing glasses/contacts?: No Does the patient have difficulty concentrating, remembering, or making decisions?: Yes Patient able to express need for assistance with ADLs?: Yes Does the patient have difficulty dressing or bathing?: Yes Independently performs ADLs?: No Communication: Independent Dressing (OT): Needs assistance Is this a change from baseline?: Pre-admission baseline Grooming: Needs assistance Is this a change from baseline?: Pre-admission baseline Feeding: Independent Bathing: Needs assistance Is this a change from baseline?: Pre-admission baseline Toileting: Needs assistance Is this a change from baseline?: Pre-admission baseline In/Out Bed: Needs assistance Is this a change from baseline?: Pre-admission baseline Walks in Home: Needs assistance Is this a change from baseline?: Pre-admission baseline Does the patient have difficulty walking or climbing stairs?: Yes Weakness of Legs: Both Weakness of Arms/Hands: Both  Permission Sought/Granted                  Emotional Assessment Appearance:: Appears stated age Attitude/Demeanor/Rapport: Engaged Affect (typically observed): Calm Orientation: : Oriented to Self, Oriented to Place, Oriented to Situation, Oriented to  Time Alcohol / Substance Use: Tobacco Use (history of smoking has quit) Psych Involvement: No (comment)  Admission diagnosis:  Shortness of breath [R06.02] Leg swelling [M79.89]  Community acquired pneumonia of right middle lobe of lung [J18.9] Patient Active Problem List   Diagnosis Date Noted   Shortness of breath 09/14/2021   Lower extremity edema 09/14/2021   Chronic kidney disease, stage 3a  (HCC) 05/27/2021   Degeneration of lumbar intervertebral disc 05/27/2021   Dry eyes 05/27/2021   Loose stools 05/27/2021   Major depression in complete remission (HCC) 05/27/2021   Major depression single episode, in partial remission (HCC) 05/27/2021   Old myocardial infarction 05/27/2021   Pure hypercholesterolemia 05/27/2021   Pyloric channel ulcer 05/27/2021   Testicular hypofunction 05/27/2021   AKI (acute kidney injury) (HCC) 07/08/2020   Thrombocytopenia (HCC) 07/08/2020   Macrocytic anemia 07/08/2020   Dementia (HCC) 07/08/2020   Closed left hip fracture, initial encounter (HCC) 07/06/2020   Memory disorder 11/08/2018   Dysarthria 04/25/2017   Delayed gastric emptying 04/22/2016   Respiratory failure (HCC) 04/16/2016   SOB (shortness of breath)    NSVT (nonsustained ventricular tachycardia) (HCC)    Elevated troponin 04/15/2016   Acute on chronic diastolic heart failure (HCC)    Gastric dysmotility    Gastrojejunostomy tube 02/06/2016 03/10/2016   Hyponatremia    Anxiety and depression    Sleep disturbance    Orthostasis    Leukocytosis    Hyperglycemia    FTT (failure to thrive) in adult    Anemia of chronic disease 01/31/2016   Stage 2 skin ulcer of sacral region (HCC) 01/31/2016   Hypomagnesemia 01/21/2016   Hypokalemia 01/21/2016   Pressure injury of skin 01/20/2016   Pleural effusion    Gastroesophageal reflux disease 01/19/2016   Insomnia 01/19/2016   Skin ulcer of abdominal wall, limited to breakdown of skin (HCC) 01/17/2016   Dysphagia 01/17/2016   Debility 01/17/2016   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   Vitamin B12 deficiency 01/02/2016   BPH (benign prostatic hyperplasia)    Posterior vitreous detachment    Salzmann's nodular dystrophy of left eye    Hyperopia of both eyes with astigmatism and presbyopia    Central pterygium of right eye    Pressure ulcer of buttock, right, unstageable (HCC) 12/30/2015   Hypernatremia 12/28/2015    Tobacco abuse 12/09/2015   Gastritis 12/09/2015   Acute respiratory failure with hypoxia (HCC)    Protein-calorie malnutrition, severe 12/04/2015   Encephalopathy acute 12/03/2015   Anxiety state 09/28/2015   Hyperlipidemia    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 09/25/2015 09/25/2015   Coronary atherosclerosis of native coronary artery 04/24/2013   Essential hypertension, benign 04/24/2013   Other and unspecified hyperlipidemia 04/24/2013   Central pterygium 10/30/2012   Far-sighted 10/30/2012   Dystrophy, Salzmann's nodular 10/30/2012   Cataract, nuclear sclerotic senile 10/30/2012   PCP:  [name], MD Pharmacy:   [facility] - [place], [place] - [address] [place] [address] Phone: [phone] Fax: [phone]     Social Determinants of Health (SDOH) Interventions    Readmission Risk Interventions     No data to display

## 2021-09-15 NOTE — Assessment & Plan Note (Signed)
-   patient presents with chief complaint of SOB/dyspnea with walking/minimal exertion - mild pulm crackles/rales, bilateral leg edema, DOE/SOB - BNP mildly elevated 110 - echo obtained and noted with: 45-50%, LV global hypokinesis, mild LVH, Gr 1 DD (findings discussed with daughter bedside) - give lasix 40 mg IV x 1 now - discharging with lasix 20 mg PO daily and recommending close outpatient followup to check BMP for tolerance; he will also benefit from referral to cardiology [facility] practice preferred). Above discussed with daughter prior to discharge and understands plan/in agreement

## 2021-09-15 NOTE — Progress Notes (Signed)
Provided and discussed discharge instructions. Addressed all questions and concerns. Ivs removed intact. Jonette Wassel N Lyrick Worland  

## 2021-09-15 NOTE — Care Management Obs Status (Signed)
MEDICARE OBSERVATION STATUS NOTIFICATION   Patient Details  Name: [name] MRN: [id number] Date of Birth: 01-16-1941   Medicare Observation Status Notification Given:  Yes    Georgie Chard, LCSW 09/15/2021, 5:04 PM

## 2021-09-15 NOTE — Care Management CC44 (Signed)
Condition Code 44 Documentation Completed  Patient Details  Name: [name] MRN: [id number] Date of Birth: 03/19/1940   Condition Code 44 given:  Yes Patient signature on Condition Code 44 notice:  Yes Documentation of 2 MD's agreement:  Yes Code 44 added to claim:  Yes    Georgie Chard, LCSW 09/15/2021, 5:04 PM

## 2021-09-15 NOTE — Progress Notes (Signed)
*  PRELIMINARY RESULTS* Echocardiogram 2D Echocardiogram has been performed.  Carolyne Fiscal 09/15/2021, 10:03 AM

## 2021-09-15 NOTE — Discharge Summary (Signed)
Physician Discharge Summary   [name] XTA:[id number] DOB: 06-Mar-1940 DOA: 09/14/2021  PCP: Lavone Orn, MD  Admit date: 09/14/2021 Discharge date:  09/15/2021  Admitted From: Home Disposition:  Home Discharging physician: Dwyane Dee, MD  Recommendations for Outpatient Follow-up:  Refer to cardiology Repeat BMP at follow. Started on lasix prior to discharge Consider d/c amlodipine and trial ARB in setting of mild [place]:  Equipment/Devices:   Discharge Condition: stable CODE STATUS: DNR Diet recommendation:  Diet Orders (From admission, onward)     Start     Ordered   09/15/21 0000  Diet - low sodium heart healthy        09/15/21 1610   09/15/21 0000  Diet - low sodium heart healthy        09/15/21 1648   09/14/21 1727  Diet regular Room service appropriate? Yes; Fluid consistency: Thin  Diet effective now       Question Answer Comment  Room service appropriate? Yes   Fluid consistency: Thin      09/14/21 1726            Hospital Course: Mr. [name] is an 81 yo male with PMH dementia, BPH, CAD, DDD, GERD, HLD, HTN who presented with worsening SOB when ambulating and LE edema.  History is provided by patient's daughter who is bedside in the ER as patient cannot provide collateral information due to underlying severe dementia.  She states that he developed lower extremity edema in both legs approximately 2 weeks ago.  After attempt of over-the-counter diuretic, there was no improvement.  He also has developed worsening wheezing, shortness of breath, and cough productive of white sputum which is new for him in regards to productive cough.  He typically walks with a walker to get around the house at home and has 24/7 care.  He does spend most of his time at home sitting on the couch. His daughter also endorses that he has felt a little "warm" recently.  CXR obtained in the ER showed no overt volume overload including pulmonary edema or effusions.  There  were also no focal infiltrates to suggest overt pneumonia.  Patient is a non-smoker. BNP very mildly elevated, 110.  WBC 13.4.  Creatinine 1.34  Assessment and Plan: * CAP (community acquired pneumonia) - No clear etiology initially on admission in terms of his SOB; CXR noted with no obvious infiltrates or signs of volume overload - covid and RVP negative - d-dimer mildly elevated and CTA chest pursued. This shows RLL infiltrate consistent with PNA and likely explains much of his SOB, cough - was given rocephin and azithro in ER on admission - will start on augmentin and doxy at this time and discharge with course to complete  Lower extremity edema - 2+ bilateral lower extremity pitting edema which is reportedly new over past 2 weeks; possibly related to shortness of breath - LE duplex negative for DVT in either extremity - see CHF; continued on lasix at discharge   Acute systolic CHF (congestive heart failure) ([place]) - patient presents with chief complaint of SOB/dyspnea with walking/minimal exertion - mild pulm crackles/rales, bilateral leg edema, DOE/SOB - BNP mildly elevated 110 - echo obtained and noted with: 45-50%, LV global hypokinesis, mild LVH, Gr 1 DD (findings discussed with daughter bedside) - give lasix 40 mg IV x 1 now - discharging with lasix 20 mg PO daily and recommending close outpatient followup to check BMP for tolerance; he will also benefit from referral  to cardiology [facility] practice preferred). Above discussed with daughter prior to discharge and understands plan/in agreement    Acute respiratory failure with hypoxia (HCC)-resolved as of 09/15/2021 - see CHF and PNA - able to be weaned to RA prior to discharge - CTA chest negative for PE  Dementia ([place]) - Continue Namenda and Depakote  Essential hypertension, benign - Continue amlodipine for now but could trial on ARB outpatient given mildly reduced EF.  Allergy list reviewed noting cough in the past due to  lisinopril  Chronic kidney disease, stage 3a ([place]) - patient has history of CKD3a. Baseline creat ~ 1.2 - 1.4, eGFR 48-55   Gastroesophageal reflux disease - Continue Protonix  Hyperlipidemia - Continue pravastatin       The patient's chronic medical conditions were treated accordingly per the patient's home medication regimen except as noted.  On day of discharge, patient was felt deemed stable for discharge. Patient/family member advised to call PCP or come back to ER if needed.   Principal Diagnosis: CAP (community acquired pneumonia)  Discharge Diagnoses: Active Hospital Problems   Diagnosis Date Noted   CAP (community acquired pneumonia) 09/14/2021    Priority: 1.   Lower extremity edema 09/14/2021    Priority: 2.   Acute systolic CHF (congestive heart failure) ([place]) 09/15/2021    Priority: 3.   Dementia ([place]) 07/08/2020    Priority: 4.   Essential hypertension, benign 04/24/2013    Priority: 4.   Chronic kidney disease, stage 3a ([place]) 05/27/2021   Gastroesophageal reflux disease 01/19/2016   BPH (benign prostatic hyperplasia)    Hyperlipidemia    Coronary atherosclerosis of native coronary artery 04/24/2013    Resolved Hospital Problems   Diagnosis Date Noted Date Resolved   Acute respiratory failure with hypoxia ([place])  09/15/2021    Priority: 3.     Discharge Instructions     Diet - low sodium heart healthy   Complete by: As directed    Diet - low sodium heart healthy   Complete by: As directed    Increase activity slowly   Complete by: As directed    Increase activity slowly   Complete by: As directed       Allergies as of 09/15/2021       Reactions   Alfuzosin Other (See Comments)   Leg weakness   Lisinopril Cough   Lorazepam Other (See Comments)   "i don't like it"  Prefers Xanax or valium   Tamsulosin Hcl Other (See Comments)   Leg weakness   Uroxatral [alfuzosin Hcl Er] Other (See Comments)   Leg weakness        Medication List      TAKE these medications    amLODipine 5 MG tablet Commonly known as: NORVASC Take 5 mg by mouth in the morning.   amoxicillin-clavulanate 875-125 MG tablet Commonly known as: AUGMENTIN Take 1 tablet by mouth every 12 (twelve) hours for 5 days.   bismuth subsalicylate 832 PQ/98YM suspension Commonly known as: PEPTO BISMOL Take 30 mLs by mouth every 6 (six) hours as needed for indigestion.   cholestyramine 4 GM/DOSE powder Commonly known as: QUESTRAN Take 4 g by mouth See admin instructions. Mix 4 grams of powder into a glass of orange juice and drink every morning   divalproex 125 MG DR tablet Commonly known as: DEPAKOTE TAKE TWO TABLETS BY MOUTH TWICE DAILY What changed:  how much to take how to take this when to take this additional instructions   doxycycline 100  MG tablet Commonly known as: VIBRA-TABS Take 1 tablet (100 mg total) by mouth every 12 (twelve) hours for 5 days.   furosemide 20 MG tablet Commonly known as: Lasix Take 1 tablet (20 mg total) by mouth daily. Start taking on: September 16, 2021   memantine 10 MG tablet Commonly known as: NAMENDA TAKE ONE TABLET BY MOUTH TWICE DAILY What changed: when to take this   Multi-Vitamins Tabs Take 1 tablet by mouth daily with breakfast.   nitroGLYCERIN 0.4 MG SL tablet Commonly known as: NITROSTAT Place 1 tablet (0.4 mg total) under the tongue every 5 (five) minutes as needed for chest pain.   pantoprazole 40 MG tablet Commonly known as: PROTONIX Take 40 mg by mouth daily before breakfast.   pravastatin 40 MG tablet Commonly known as: PRAVACHOL Take 40 mg by mouth at bedtime.        Allergies  Allergen Reactions   Alfuzosin Other (See Comments)    Leg weakness    Lisinopril Cough   Lorazepam Other (See Comments)    "i don't like it"  Prefers Xanax or valium   Tamsulosin Hcl Other (See Comments)    Leg weakness   Uroxatral [Alfuzosin Hcl Er] Other (See Comments)    Leg weakness     Consultations:   Procedures:   Discharge Exam: BP (!) 144/54 (BP Location: Right Arm)   Pulse (!) 56   Temp 99.1 F (37.3 C) (Oral)   Resp 19   Ht 6' (1.829 m)   Wt 76.5 kg   SpO2 97%   BMI 22.88 kg/m  Physical Exam Constitutional:      General: He is not in acute distress.    Appearance: Normal appearance.  HENT:     Head: Normocephalic and atraumatic.     Mouth/Throat:     Mouth: Mucous membranes are moist.  Eyes:     Extraocular Movements: Extraocular movements intact.  Cardiovascular:     Rate and Rhythm: Normal rate and regular rhythm.     Heart sounds: Normal heart sounds.  Pulmonary:     Effort: Pulmonary effort is normal. No respiratory distress.     Breath sounds: Normal breath sounds. No wheezing.  Abdominal:     General: Bowel sounds are normal. There is no distension.     Palpations: Abdomen is soft.     Tenderness: There is no abdominal tenderness.  Musculoskeletal:        General: Normal range of motion.     Cervical back: Normal range of motion and neck supple.     Comments: 2+ bilateral lower extremity pitting edema to mid legs  Skin:    General: Skin is warm and dry.  Neurological:     General: No focal deficit present.     Mental Status: He is alert.  Psychiatric:        Mood and Affect: Mood normal.        Behavior: Behavior normal.      The results of significant diagnostics from this hospitalization (including imaging, microbiology, ancillary and laboratory) are listed below for reference.   Microbiology: Recent Results (from the past 240 hour(s))  SARS Coronavirus 2 by RT PCR (hospital order, performed in [facility] hospital lab) *cepheid single result test* Anterior Nasal Swab     Status: None   Collection Time: 09/14/21  7:33 PM   Specimen: Anterior Nasal Swab  Result Value Ref Range Status   SARS Coronavirus 2 by RT PCR NEGATIVE NEGATIVE Final  Comment: (NOTE) SARS-CoV-2 target nucleic acids are NOT DETECTED.  The  SARS-CoV-2 RNA is generally detectable in upper and lower respiratory specimens during the acute phase of infection. The lowest concentration of SARS-CoV-2 viral copies this assay can detect is 250 copies / mL. A negative result does not preclude SARS-CoV-2 infection and should not be used as the sole basis for treatment or other patient management decisions.  A negative result may occur with improper specimen collection / handling, submission of specimen other than nasopharyngeal swab, presence of viral mutation(s) within the areas targeted by this assay, and inadequate number of viral copies (<250 copies / mL). A negative result must be combined with clinical observations, patient history, and epidemiological information.  Fact Sheet for Patients:   https://www.patel.info/  Fact Sheet for Healthcare Providers: https://hall.com/  This test is not yet approved or  cleared by the [place] FDA and has been authorized for detection and/or diagnosis of SARS-CoV-2 by FDA under an Emergency Use Authorization (EUA).  This EUA will remain in effect (meaning this test can be used) for the duration of the COVID-19 declaration under Section 564(b)(1) of the Act, 21 U.S.C. section 360bbb-3(b)(1), unless the authorization is terminated or revoked sooner.  Performed at [facility], [place] [address] [place], [place] [address]   Respiratory (~20 pathogens) panel by PCR     Status: None   Collection Time: 09/14/21  7:33 PM   Specimen: Nasopharyngeal Swab; Respiratory  Result Value Ref Range Status   Adenovirus NOT DETECTED NOT DETECTED Final   Coronavirus 229E NOT DETECTED NOT DETECTED Final    Comment: (NOTE) The Coronavirus on the Respiratory Panel, DOES NOT test for the novel  Coronavirus (2019 nCoV)    Coronavirus HKU1 NOT DETECTED NOT DETECTED Final   Coronavirus NL63 NOT DETECTED NOT DETECTED Final   Coronavirus OC43 NOT  DETECTED NOT DETECTED Final   Metapneumovirus NOT DETECTED NOT DETECTED Final   Rhinovirus / Enterovirus NOT DETECTED NOT DETECTED Final   Influenza A NOT DETECTED NOT DETECTED Final   Influenza B NOT DETECTED NOT DETECTED Final   Parainfluenza Virus 1 NOT DETECTED NOT DETECTED Final   Parainfluenza Virus 2 NOT DETECTED NOT DETECTED Final   Parainfluenza Virus 3 NOT DETECTED NOT DETECTED Final   Parainfluenza Virus 4 NOT DETECTED NOT DETECTED Final   Respiratory Syncytial Virus NOT DETECTED NOT DETECTED Final   Bordetella pertussis NOT DETECTED NOT DETECTED Final   Bordetella Parapertussis NOT DETECTED NOT DETECTED Final   Chlamydophila pneumoniae NOT DETECTED NOT DETECTED Final   Mycoplasma pneumoniae NOT DETECTED NOT DETECTED Final    Comment: Performed at [facility], [place]. [address], [place], [place] [address]     Labs: BNP (last 3 results) Recent Labs    09/14/21 1406  BNP 098.1*   Basic Metabolic Panel: Recent Labs  Lab 09/14/21 1415 09/15/21 0322  NA 138 141  K 4.1 4.3  CL 106 110  CO2 23 23  GLUCOSE 95 106*  BUN 23 20  CREATININE 1.34* 1.17  CALCIUM 8.8* 8.1*  MG 1.8 1.9   Liver Function Tests: Recent Labs  Lab 09/14/21 1415  AST 21  ALT 13  ALKPHOS 87  BILITOT 0.5  PROT 7.5  ALBUMIN 3.4*   No results for input(s): "LIPASE", "AMYLASE" in the last 168 hours. No results for input(s): "AMMONIA" in the last 168 hours. CBC: Recent Labs  Lab 09/14/21 1415 09/15/21 0322  WBC 13.4* 13.1*  NEUTROABS 9.8* 9.6*  HGB 13.9 11.3*  HCT 42.9 34.4*  MCV 100.0 99.1  PLT 242 181   Cardiac Enzymes: No results for input(s): "CKTOTAL", "CKMB", "CKMBINDEX", "TROPONINI" in the last 168 hours. BNP: Invalid input(s): "POCBNP" CBG: No results for input(s): "GLUCAP" in the last 168 hours. D-Dimer Recent Labs    09/14/21 1925  DDIMER 1.16*   Hgb A1c No results for input(s): "HGBA1C" in the last 72 hours. Lipid Profile No results for input(s): "CHOL",  "HDL", "LDLCALC", "TRIG", "CHOLHDL", "LDLDIRECT" in the last 72 hours. Thyroid function studies Recent Labs    09/15/21 0322  TSH 2.472   Anemia work up No results for input(s): "VITAMINB12", "FOLATE", "FERRITIN", "TIBC", "IRON", "RETICCTPCT" in the last 72 hours. Urinalysis    Component Value Date/Time   COLORURINE YELLOW 02/28/2016 1036   APPEARANCEUR CLEAR 02/28/2016 1036   LABSPEC 1.018 02/28/2016 1036   PHURINE 5.0 02/28/2016 1036   GLUCOSEU NEGATIVE 02/28/2016 1036   HGBUR NEGATIVE 02/28/2016 1036   BILIRUBINUR NEGATIVE 02/28/2016 1036   KETONESUR NEGATIVE 02/28/2016 1036   PROTEINUR NEGATIVE 02/28/2016 1036   NITRITE NEGATIVE 02/28/2016 1036   LEUKOCYTESUR NEGATIVE 02/28/2016 1036   Sepsis Labs Recent Labs  Lab 09/14/21 1415 09/15/21 0322  WBC 13.4* 13.1*   Microbiology Recent Results (from the past 240 hour(s))  SARS Coronavirus 2 by RT PCR (hospital order, performed in [facility] hospital lab) *cepheid single result test* Anterior Nasal Swab     Status: None   Collection Time: 09/14/21  7:33 PM   Specimen: Anterior Nasal Swab  Result Value Ref Range Status   SARS Coronavirus 2 by RT PCR NEGATIVE NEGATIVE Final    Comment: (NOTE) SARS-CoV-2 target nucleic acids are NOT DETECTED.  The SARS-CoV-2 RNA is generally detectable in upper and lower respiratory specimens during the acute phase of infection. The lowest concentration of SARS-CoV-2 viral copies this assay can detect is 250 copies / mL. A negative result does not preclude SARS-CoV-2 infection and should not be used as the sole basis for treatment or other patient management decisions.  A negative result may occur with improper specimen collection / handling, submission of specimen other than nasopharyngeal swab, presence of viral mutation(s) within the areas targeted by this assay, and inadequate number of viral copies (<250 copies / mL). A negative result must be combined with clinical observations,  patient history, and epidemiological information.  Fact Sheet for Patients:   https://www.patel.info/  Fact Sheet for Healthcare Providers: https://hall.com/  This test is not yet approved or  cleared by the [place] FDA and has been authorized for detection and/or diagnosis of SARS-CoV-2 by FDA under an Emergency Use Authorization (EUA).  This EUA will remain in effect (meaning this test can be used) for the duration of the COVID-19 declaration under Section 564(b)(1) of the Act, 21 U.S.C. section 360bbb-3(b)(1), unless the authorization is terminated or revoked sooner.  Performed at [facility], [place] [address], [place], [place] [address]   Respiratory (~20 pathogens) panel by PCR     Status: None   Collection Time: 09/14/21  7:33 PM   Specimen: Nasopharyngeal Swab; Respiratory  Result Value Ref Range Status   Adenovirus NOT DETECTED NOT DETECTED Final   Coronavirus 229E NOT DETECTED NOT DETECTED Final    Comment: (NOTE) The Coronavirus on the Respiratory Panel, DOES NOT test for the novel  Coronavirus (2019 nCoV)    Coronavirus HKU1 NOT DETECTED NOT DETECTED Final   Coronavirus NL63 NOT DETECTED NOT DETECTED Final  Coronavirus OC43 NOT DETECTED NOT DETECTED Final   Metapneumovirus NOT DETECTED NOT DETECTED Final   Rhinovirus / Enterovirus NOT DETECTED NOT DETECTED Final   Influenza A NOT DETECTED NOT DETECTED Final   Influenza B NOT DETECTED NOT DETECTED Final   Parainfluenza Virus 1 NOT DETECTED NOT DETECTED Final   Parainfluenza Virus 2 NOT DETECTED NOT DETECTED Final   Parainfluenza Virus 3 NOT DETECTED NOT DETECTED Final   Parainfluenza Virus 4 NOT DETECTED NOT DETECTED Final   Respiratory Syncytial Virus NOT DETECTED NOT DETECTED Final   Bordetella pertussis NOT DETECTED NOT DETECTED Final   Bordetella Parapertussis NOT DETECTED NOT DETECTED Final   Chlamydophila pneumoniae NOT DETECTED NOT DETECTED  Final   Mycoplasma pneumoniae NOT DETECTED NOT DETECTED Final    Comment: Performed at [facility], [place] [address], [place], [place] [address]    Procedures/Studies: CT Angio Chest Pulmonary Embolism (PE) W or WO Contrast  Result Date: 09/15/2021 CLINICAL DATA:  Acute hypoxic respiratory failure EXAM: CT ANGIOGRAPHY CHEST WITH CONTRAST TECHNIQUE: Multidetector CT imaging of the chest was performed using the standard protocol during bolus administration of intravenous contrast. Multiplanar CT image reconstructions and MIPs were obtained to evaluate the vascular anatomy. RADIATION DOSE REDUCTION: This exam was performed according to the departmental dose-optimization program which includes automated exposure control, adjustment of the mA and/or kV according to patient size and/or use of iterative reconstruction technique. CONTRAST:  68m OMNIPAQUE IOHEXOL 350 MG/ML SOLN COMPARISON:  Radiographs 09/14/2021 and CT chest 04/15/2016 FINDINGS: Cardiovascular: Satisfactory opacification of the pulmonary arteries to the segmental level. No evidence of pulmonary embolism. Normal heart size. No pericardial effusion. Coronary artery and aortic atherosclerotic calcification. Mediastinum/Nodes: No enlarged mediastinal, hilar, or axillary lymph nodes. Thyroid gland, trachea, and esophagus demonstrate no significant findings. Lungs/Pleura: Focal consolidation and adjacent ground-glass opacities with peribronchial and interlobular septal thickening in the right lower lobe. Upper lobe predominant emphysema. The 6 mm nodule in the medial left upper lobe (series 6/image 93) is not significantly changed from 05/03/2016 and should be benign. Mild diffuse bronchial wall thickening. No pleural effusion or pneumothorax. Upper Abdomen: Cholecystectomy. Postoperative changes about the stomach. No acute abnormality. Musculoskeletal: No chest wall abnormality. No acute or significant osseous findings. Review of the MIP images  confirms the above findings. IMPRESSION: 1. Right lower lobe infiltrates compatible with pneumonia. Follow-up is recommended in 4-6 weeks to ensure resolution. 2. Negative for acute pulmonary embolism. 3. Aortic Atherosclerosis (ICD10-I70.0) and Emphysema (ICD10-J43.9). Electronically Signed   By: TPlacido SouM.D.   On: 09/15/2021 15:50   ECHOCARDIOGRAM COMPLETE  Result Date: 09/15/2021    ECHOCARDIOGRAM REPORT   Patient Name:   Danny STRUBELDate of Exam: 09/15/2021 Medical Rec #:  [id number]      Height:       72.0 in Accession #:    [id number]     Weight:       168.7 lb Date of Birth:  [birth year]     BSA:          1.982 m Patient Age:    89years        BP:           132/53 mmHg Patient Gender: M               HR:           72 bpm. Exam Location:  Inpatient Procedure: 2D Echo, Cardiac Doppler, Color Doppler and Intracardiac  Opacification Agent Indications:    Edema  History:        Patient has prior history of Echocardiogram examinations, most                 recent 04/15/2016. CHF, CAD, Signs/Symptoms:Shortness of Breath                 and Edema; Risk Factors:Hypertension, Dyslipidemia and Former                 Smoker.  Sonographer:    Wenda Low Referring Phys: [place]  1. Left ventricular ejection fraction, by estimation, is 45 to 50%. The left ventricle has mildly decreased function. The left ventricle demonstrates global hypokinesis. There is mild left ventricular hypertrophy. Left ventricular diastolic parameters are consistent with Grade I diastolic dysfunction (impaired relaxation).  2. Right ventricular systolic function is normal. The right ventricular size is normal. Tricuspid regurgitation signal is inadequate for assessing PA pressure.  3. The mitral valve is normal in structure. No evidence of mitral valve regurgitation. No evidence of mitral stenosis.  4. The aortic valve is tricuspid. There is mild calcification of the aortic valve. Aortic valve  regurgitation is mild. No aortic stenosis is present.  5. The inferior vena cava is normal in size with greater than 50% respiratory variability, suggesting right atrial pressure of 3 mmHg.  6. Technically difficult study with poor acoustic windows. FINDINGS  Left Ventricle: Left ventricular ejection fraction, by estimation, is 45 to 50%. The left ventricle has mildly decreased function. The left ventricle demonstrates global hypokinesis. Definity contrast agent was given IV to delineate the left ventricular  endocardial borders. The left ventricular internal cavity size was normal in size. There is mild left ventricular hypertrophy. Left ventricular diastolic parameters are consistent with Grade I diastolic dysfunction (impaired relaxation). Right Ventricle: The right ventricular size is normal. No increase in right ventricular wall thickness. Right ventricular systolic function is normal. Tricuspid regurgitation signal is inadequate for assessing PA pressure. Left Atrium: Left atrial size was normal in size. Right Atrium: Right atrial size was normal in size. Pericardium: There is no evidence of pericardial effusion. Mitral Valve: The mitral valve is normal in structure. No evidence of mitral valve regurgitation. No evidence of mitral valve stenosis. MV peak gradient, 4.0 mmHg. The mean mitral valve gradient is 1.0 mmHg. Tricuspid Valve: The tricuspid valve is normal in structure. Tricuspid valve regurgitation is trivial. Aortic Valve: The aortic valve is tricuspid. There is mild calcification of the aortic valve. Aortic valve regurgitation is mild. Aortic regurgitation PHT measures 616 msec. No aortic stenosis is present. Aortic valve mean gradient measures 2.5 mmHg. Aortic valve peak gradient measures 6.2 mmHg. Aortic valve area, by VTI measures 3.27 cm. Pulmonic Valve: The pulmonic valve was normal in structure. Pulmonic valve regurgitation is not visualized. Aorta: The aortic root is normal in size and  structure. Venous: The inferior vena cava is normal in size with greater than 50% respiratory variability, suggesting right atrial pressure of 3 mmHg. IAS/Shunts: No atrial level shunt detected by color flow Doppler.  LEFT VENTRICLE PLAX 2D LVIDd:         4.30 cm   Diastology LVIDs:         3.10 cm   LV e' medial:    5.55 cm/s LV PW:         1.00 cm   LV E/e' medial:  12.2 LV IVS:        0.90 cm  LV e' lateral:   7.40 cm/s LVOT diam:     2.20 cm   LV E/e' lateral: 9.1 LV SV:         81 LV SV Index:   41 LVOT Area:     3.80 cm  RIGHT VENTRICLE RV Basal diam:  3.30 cm LEFT ATRIUM           Index        RIGHT ATRIUM           Index LA diam:      2.70 cm 1.36 cm/m   RA Area:     15.70 cm LA Vol (A2C): 21.6 ml 10.90 ml/m  RA Volume:   45.80 ml  23.11 ml/m LA Vol (A4C): 16.0 ml 8.07 ml/m  AORTIC VALVE                    PULMONIC VALVE AV Area (Vmax):    2.93 cm     PV Vmax:       0.72 m/s AV Area (Vmean):   3.52 cm     PV Peak grad:  2.1 mmHg AV Area (VTI):     3.27 cm AV Vmax:           124.00 cm/s AV Vmean:          66.550 cm/s AV VTI:            0.249 m AV Peak Grad:      6.2 mmHg AV Mean Grad:      2.5 mmHg LVOT Vmax:         95.65 cm/s LVOT Vmean:        61.650 cm/s LVOT VTI:          0.214 m LVOT/AV VTI ratio: 0.86 AI PHT:            616 msec  AORTA Ao Root diam: 3.40 cm Ao Asc diam:  3.40 cm MITRAL VALVE MV Area (PHT): 3.99 cm    SHUNTS MV Area VTI:   2.73 cm    Systemic VTI:  0.21 m MV Peak grad:  4.0 mmHg    Systemic Diam: 2.20 cm MV Mean grad:  1.0 mmHg MV Vmax:       1.00 m/s MV Vmean:      41.7 cm/s MV Decel Time: 190 msec MV E velocity: 67.70 cm/s MV A velocity: 74.10 cm/s MV E/A ratio:  0.91 Dalton McleanMD Electronically signed by Franki Monte Signature Date/Time: 09/15/2021/1:49:36 PM    Final    VAS [place] LOWER EXTREMITY VENOUS (DVT)  Result Date: 09/15/2021  Lower Venous DVT Study Patient Name:  HILDA WEXLER  Date of Exam:   09/15/2021 Medical Rec #: [id number]        Accession #:     [id number] Date of Birth: 11-Mar-1940       Patient Gender: M Patient Age:   81 years Exam Location:  [facility] Procedure:      VAS [place] LOWER EXTREMITY VENOUS (DVT) Referring Phys: Dwyane Dee --------------------------------------------------------------------------------  Indications: Swelling.  Risk Factors: None identified. Comparison Study: No prior studies. Performing Technologist: Oliver Hum RVT  Examination Guidelines: A complete evaluation includes B-mode imaging, spectral Doppler, color Doppler, and power Doppler as needed of all accessible portions of each vessel. Bilateral testing is considered an integral part of a complete examination. Limited examinations for reoccurring indications may be performed as noted. The reflux portion of the exam is performed with the patient  in reverse Trendelenburg.  +---------+---------------+---------+-----------+----------+--------------+ RIGHT    CompressibilityPhasicitySpontaneityPropertiesThrombus Aging +---------+---------------+---------+-----------+----------+--------------+ CFV      Full           Yes      Yes                                 +---------+---------------+---------+-----------+----------+--------------+ SFJ      Full                                                        +---------+---------------+---------+-----------+----------+--------------+ FV Prox  Full                                                        +---------+---------------+---------+-----------+----------+--------------+ FV Mid   Full                                                        +---------+---------------+---------+-----------+----------+--------------+ FV DistalFull                                                        +---------+---------------+---------+-----------+----------+--------------+ PFV      Full                                                         +---------+---------------+---------+-----------+----------+--------------+ POP      Full           Yes      Yes                                 +---------+---------------+---------+-----------+----------+--------------+ PTV      Full                                                        +---------+---------------+---------+-----------+----------+--------------+ PERO     Full                                                        +---------+---------------+---------+-----------+----------+--------------+   +---------+---------------+---------+-----------+----------+--------------+ LEFT     CompressibilityPhasicitySpontaneityPropertiesThrombus Aging +---------+---------------+---------+-----------+----------+--------------+ CFV      Full           Yes      Yes                                 +---------+---------------+---------+-----------+----------+--------------+  SFJ      Full                                                        +---------+---------------+---------+-----------+----------+--------------+ FV Prox  Full                                                        +---------+---------------+---------+-----------+----------+--------------+ FV Mid   Full                                                        +---------+---------------+---------+-----------+----------+--------------+ FV DistalFull                                                        +---------+---------------+---------+-----------+----------+--------------+ PFV      Full                                                        +---------+---------------+---------+-----------+----------+--------------+ POP      Full           Yes      Yes                                 +---------+---------------+---------+-----------+----------+--------------+ PTV      Full                                                         +---------+---------------+---------+-----------+----------+--------------+ PERO     Full                                                        +---------+---------------+---------+-----------+----------+--------------+     Summary: RIGHT: - There is no evidence of deep vein thrombosis in the lower extremity.  - No cystic structure found in the popliteal fossa.  LEFT: - There is no evidence of deep vein thrombosis in the lower extremity.  - No cystic structure found in the popliteal fossa.  *See table(s) above for measurements and observations.    Preliminary    DG Chest Portable 1 View  Result Date: 09/14/2021 CLINICAL DATA:  Dyspnea, edema in both lower extremities EXAM: PORTABLE CHEST 1 VIEW COMPARISON:  Previous studies including the examination of 08/10/2021 FINDINGS: Cardiac size is within normal limits. There are no signs  of pulmonary edema. Low position of diaphragms may suggest COPD. Linear densities are seen in the medial right lower lung field. There is no focal consolidation. There is no pleural effusion or pneumothorax. IMPRESSION: COPD. Small linear densities in medial right lower lung field may suggest crowding of normal bronchovascular structures or subsegmental atelectasis. There are no signs of alveolar pulmonary edema. Electronically Signed   By: Elmer Picker M.D.   On: 09/14/2021 13:31     Time coordinating discharge: Over 30 minutes    Dwyane Dee, MD  [facility] 09/15/2021, 4:49 PM

## 2021-09-15 NOTE — Assessment & Plan Note (Signed)
>>  ASSESSMENT AND PLAN FOR ACUTE SYSTOLIC CHF (CONGESTIVE HEART FAILURE) (HCC) WRITTEN ON 09/15/2021  4:36 PM BY Frederick Peers, DAVID, MD  - patient presents with chief complaint of SOB/dyspnea with walking/minimal exertion - mild pulm crackles/rales, bilateral leg edema, DOE/SOB - BNP mildly elevated 110 - echo obtained and noted with: 45-50%, LV global hypokinesis, mild LVH, Gr 1 DD (findings discussed with daughter bedside) - give lasix 40 mg IV x 1 now - discharging with lasix 20 mg PO daily and recommending close outpatient followup to check BMP for tolerance; he will also benefit from referral to cardiology [facility] practice preferred). Above discussed with daughter prior to discharge and understands plan/in agreement

## 2021-09-15 NOTE — Progress Notes (Signed)
Bilateral lower extremity venous duplex has been completed. Preliminary results can be found in CV Proc through chart review.   09/15/21 8:54 AM Olen Cordial RVT

## 2021-09-20 ENCOUNTER — Other Ambulatory Visit: Payer: Self-pay | Admitting: Neurology

## 2021-09-22 DIAGNOSIS — N1832 Chronic kidney disease, stage 3b: Secondary | ICD-10-CM | POA: Diagnosis not present

## 2021-09-22 DIAGNOSIS — K219 Gastro-esophageal reflux disease without esophagitis: Secondary | ICD-10-CM | POA: Diagnosis not present

## 2021-09-22 DIAGNOSIS — R197 Diarrhea, unspecified: Secondary | ICD-10-CM | POA: Diagnosis not present

## 2021-09-22 DIAGNOSIS — I5040 Unspecified combined systolic (congestive) and diastolic (congestive) heart failure: Secondary | ICD-10-CM | POA: Diagnosis not present

## 2021-10-12 ENCOUNTER — Other Ambulatory Visit: Payer: Self-pay | Admitting: Neurology

## 2021-10-13 DIAGNOSIS — E7800 Pure hypercholesterolemia, unspecified: Secondary | ICD-10-CM | POA: Diagnosis not present

## 2021-10-13 DIAGNOSIS — N1832 Chronic kidney disease, stage 3b: Secondary | ICD-10-CM | POA: Diagnosis not present

## 2021-10-13 DIAGNOSIS — K219 Gastro-esophageal reflux disease without esophagitis: Secondary | ICD-10-CM | POA: Diagnosis not present

## 2021-10-13 DIAGNOSIS — I2510 Atherosclerotic heart disease of native coronary artery without angina pectoris: Secondary | ICD-10-CM | POA: Diagnosis not present

## 2021-10-13 DIAGNOSIS — I10 Essential (primary) hypertension: Secondary | ICD-10-CM | POA: Diagnosis not present

## 2021-10-20 DIAGNOSIS — N1832 Chronic kidney disease, stage 3b: Secondary | ICD-10-CM | POA: Diagnosis not present

## 2021-10-22 ENCOUNTER — Other Ambulatory Visit: Payer: Self-pay | Admitting: Neurology

## 2021-11-25 ENCOUNTER — Encounter: Payer: Self-pay | Admitting: Cardiology

## 2021-11-25 ENCOUNTER — Ambulatory Visit: Payer: Medicare Other | Attending: Cardiology | Admitting: Cardiology

## 2021-11-25 VITALS — BP 154/64 | HR 61 | Ht 72.0 in | Wt 161.0 lb

## 2021-11-25 DIAGNOSIS — I5021 Acute systolic (congestive) heart failure: Secondary | ICD-10-CM

## 2021-11-25 DIAGNOSIS — E7800 Pure hypercholesterolemia, unspecified: Secondary | ICD-10-CM

## 2021-11-25 DIAGNOSIS — Z79899 Other long term (current) drug therapy: Secondary | ICD-10-CM | POA: Diagnosis not present

## 2021-11-25 MED ORDER — SPIRONOLACTONE 25 MG PO TABS: 12.5000 mg | ORAL_TABLET | Freq: Every day | ORAL | 3 refills | Status: DC

## 2021-11-25 NOTE — Progress Notes (Signed)
Cardiology Office Note:    Date:  11/25/2021   ID:  Danny GalRalph T Propp, DOB [birth year], MRN [id number]  PCP:  Kirby FunkGriffin, John, MD   [facility] Providers Cardiologist:  None     Referring MD: Emilio AspenHenderson, Jonathan A, *   History of Present Illness:    [name] is a 81 y.o. male here for the evaluation of combined systolic/diastolic congestive heart failure EF 45 to 50% at the request of Kirby FunkJohn Griffin, MD.  He was seen on 09/22/2021 by Eleanora NeighborJonathan Henderson, MD. Per the review of his prior hospitalization, there was reason to presume heart failure exacerbation. His echocardiogram showed mild systolic dysfunction and grade 1 diastolic dysfunction. His BNP was only mildly elevated, but he was fluid overloaded and responded well to diuresis. At his visit with Dr. Orson AloeHenderson amlodipine was discontinued and he was started on telmisartan.  Today, he is accompanied by his daughter. He complains of shortness of breath only with significant exertion.   His blood pressure was high today at 154/64, which is similar to his at home range of 149-154. He is taking new antihypertensives prescribed by Dr. Orson AloeHenderson. Although his blood pressure does not seem to be improving, his daughter has noticed that he appears to have more energy.   He has been taking Lasix for LE edema, which is much improved today. He has been working on lowering his salt intake and drinking more water. Not on potassium supplementation.  He denies any palpitations, or chest pain. No lightheadedness, headaches, syncope, orthopnea, or PND.   Past Medical History:  Diagnosis Date   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 09/25/2015 09/25/2015   Aspiration pneumonia (HCC) 01/17/2016   Bilateral dry eyes    Bile peritonitis due to gastric tube dislodgement 12/03/2015   BPH (benign prostatic hyperplasia)    Central pterygium of right eye    Chronic respiratory failure with hypoxia (HCC)    Coronary artery disease  1997, 2009   bare mental stent right coronart artery, occlusive followed by Dr. 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 Bernadette [facility]itHayden [facility] in [place]   Peptic ulcer    Pneumatosis of intestines 12/11/2015   Pneumonia    HISTORY OF IN CHILDHOOD   Posterior vitreous detachment 10/30/2012   Pyloric stricture 11/24/2015   Salzmann's nodular dystrophy of left eye    SBO (small bowel obstruction) (HCC) 01/20/2016   Urinary hesitancy    Wears glasses     Past Surgical History:  Procedure Laterality Date   CARDIAC CATHETERIZATION     CHOLECYSTECTOMY     ESOPHAGOGASTRODUODENOSCOPY N/A 12/01/2015   Procedure: ESOPHAGOGASTRODUODENOSCOPY (EGD) BALLOON DILATION OF DUODENAL STRICTURE;  Surgeon: Karie SodaSteven Gross, MD;  Location: [facility];  Service: General;  Laterality: N/A;   ESOPHAGOGASTRODUODENOSCOPY N/A 12/14/2015   Procedure: ESOPHAGOGASTRODUODENOSCOPY (EGD);  Surgeon: Vida RiggerMarc Magod, MD;  [facility];  Service: Endoscopy;  Laterality: N/A;  Patient has tracheostomy   ESOPHAGOGASTRODUODENOSCOPY (EGD) WITH PROPOFOL N/A 07/01/2015   Procedure: ESOPHAGOGASTRODUODENOSCOPY (EGD) WITH  PROPOFOL;  Surgeon: Willis Modena, MD;  Location: [facility];  Service: Endoscopy;  Laterality: N/A;   ESOPHAGOGASTRODUODENOSCOPY (EGD) WITH PROPOFOL N/A 11/23/2015   Procedure: ESOPHAGOGASTRODUODENOSCOPY (EGD) WITH PROPOFOL;  Surgeon: Willis Modena, MD;  Location: [facility];  Service: Endoscopy;  Laterality: N/A;  may need to intubate   EYE SURGERY     growth on right eye  removed    HIP ARTHROPLASTY Left 07/07/2020   Procedure: ARTHROPLASTY BIPOLAR HIP (HEMIARTHROPLASTY);  Surgeon: [name], MD;  Location: [facility];  Service: Orthopedics;  Laterality: Left;   IR GENERIC HISTORICAL  12/13/2015   IR REPLC DUODEN/JEJUNO TUBE PERCUT W/FLUORO 12/13/2015 [name], MD [facility]   IR GENERIC HISTORICAL  02/03/2016   IR REPLC GASTRO/COLONIC TUBE PERCUT W/FLUORO 02/03/2016 Berdine Dance, MD [facility]   IR GENERIC HISTORICAL  02/04/2016   IR GASTR TUBE CONVERT GASTR-JEJ PER W/FL MOD SED 02/04/2016 Malachy Moan, MD [facility]   IR GENERIC HISTORICAL  02/25/2016   IR SINUS/FIST TUBE CHK-NON GI 02/25/2016 Irish Lack, MD [facility]   IR GENERIC HISTORICAL  03/18/2016   IR CM INJ ANY COLONIC TUBE W/FLUORO 03/18/2016 Simonne Come, MD [facility]   IR GENERIC HISTORICAL  03/18/2016   IR GJ TUBE CHANGE 03/18/2016 Simonne Come, MD [facility]   IR GENERIC HISTORICAL  03/22/2016   IR CM INJ ANY COLONIC TUBE W/FLUORO 03/22/2016 Simonne Come, MD [facility]   IR GENERIC HISTORICAL  04/13/2016   IR CM INJ ANY COLONIC TUBE W/FLUORO 04/13/2016 Richarda Overlie, MD [facility]   LAPAROSCOPIC GASTROSTOMY N/A 12/01/2015   Procedure: LAPAROSCOPIC PLACEMENT OF FEEDING JEJUNOSTOMY AND GASTROSTOMY TUBE;  Surgeon: Karie Soda, MD;  Location: [facility];  Service: General;  Laterality: N/A;   LAPAROSCOPY N/A 12/03/2015   Procedure: LAPAROSCOPY DIAGNOSTIC, OMENTOPEXY, JEJUNOSTOMY, WASH OUT;  Surgeon: Karie Soda, MD;  Location: [facility];  Service: General;  Laterality: N/A;   LAPAROTOMY N/A 02/06/2016   Procedure: EXPLORATORY LAPAROTOMY, LYSIS OF ADHESIONS, SEGMENTAL SMALL BOWEL RESECTION, GASTROJEJUNOSTOMY;  Surgeon: Chevis Pretty III, MD;  Location: [facility];  Service: General;  Laterality: N/A;   STENT TO HEART     2 1997 AND 1 IN 1996   TONSILLECTOMY     XI ROBOTIC VAGOTOMY AND ANTRECTOMY N/A 09/25/2015   Procedure: XI ROBOTIC ANTERIOR AND POSTERIOR VAGOTOMY, BILROTH I  ANASTOMOSIS DOR  FUNDIPLICATION, OMENTOPEXY  UPPER ENDOSCOPY;  Surgeon: Karie Soda, MD;  Location: [facility];  Service: General;  Laterality: N/A;    Current Medications: Current Meds  Medication Sig   bismuth subsalicylate (PEPTO BISMOL) 262 MG/15ML suspension Take 30 mLs by mouth every 6 (six) hours as needed for indigestion.   cholestyramine (QUESTRAN) 4 GM/DOSE powder Take 4 g by mouth See admin instructions. Mix 4 grams of powder into a glass of orange juice and drink every morning   furosemide (LASIX) 20 MG tablet Take 1 tablet (20 mg total) by mouth daily.   loperamide (IMODIUM) 2 MG capsule Take 2 mg by mouth daily.   memantine (NAMENDA) 10 MG tablet TAKE ONE TABLET BY MOUTH TWICE DAILY Needs appointment for further refills   Multiple Vitamin (MULTI-VITAMINS) TABS Take 1 tablet by mouth daily with breakfast.   nitroGLYCERIN (NITROSTAT) 0.4 MG SL tablet Place 1 tablet (0.4 mg total) under the tongue every 5 (five) minutes as needed for chest pain.   pantoprazole (PROTONIX) 40 MG tablet Take 40 mg by mouth daily before breakfast.   pravastatin (PRAVACHOL) 40 MG tablet Take 40 mg by  mouth at bedtime.   spironolactone (ALDACTONE) 25 MG tablet Take 0.5 tablets (12.5 mg total) by mouth daily.   telmisartan (MICARDIS) 40 MG tablet Take 40 mg by mouth daily.   [DISCONTINUED] amLODipine (NORVASC) 5 MG tablet Take 5 mg by mouth in the morning.   [DISCONTINUED] divalproex (DEPAKOTE) 125 MG DR tablet TAKE TWO TABLETS BY MOUTH TWICE DAILY     Allergies:   Alfuzosin, Lisinopril, Lorazepam, Tamsulosin hcl, and Uroxatral [alfuzosin hcl er]   Social History   Socioeconomic History   Marital status: Widowed    Spouse name: Not on file   Number of children: 1   Years of education: BA   Highest education level: Not on file  Occupational History   Occupation: Retired  Tobacco Use   Smoking status: Former    Packs/day: 2.00    Years: 20.00    Total pack years: 40.00    Types: Cigarettes    Quit date: 02/15/1995     Years since quitting: 26.7   Smokeless tobacco: Never  Substance and Sexual Activity   Alcohol use: No   Drug use: No   Sexual activity: Never  Other Topics Concern   Not on file  Social History Narrative   Lives at home. Has home health aids   Drinks 1 cups caffeine daily   Right-handed   Social Determinants of Health   Financial Resource Strain: Not on file  Food Insecurity: Not on file  Transportation Needs: Not on file  Physical Activity: Not on file  Stress: Not on file  Social Connections: Not on file     Family History: The patient's family history includes Heart attack in his mother.  ROS:   Please see the history of present illness.    (+) Exertional shortness of breath All other systems reviewed and are negative.  EKGs/Labs/Other Studies Reviewed:    The following studies were reviewed today:  CTA Chest  09/15/2021: IMPRESSION: 1. Right lower lobe infiltrates compatible with pneumonia. Follow-up is recommended in 4-6 weeks to ensure resolution. 2. Negative for acute pulmonary embolism. 3. Aortic Atherosclerosis (ICD10-I70.0) and Emphysema (ICD10-J43.9).  Echo  09/15/2021:  1. Left ventricular ejection fraction, by estimation, is 45 to 50%. The  left ventricle has mildly decreased function. The left ventricle  demonstrates global hypokinesis. There is mild left ventricular  hypertrophy. Left ventricular diastolic parameters  are consistent with Grade I diastolic dysfunction (impaired relaxation).   2. Right ventricular systolic function is normal. The right ventricular  size is normal. Tricuspid regurgitation signal is inadequate for assessing  PA pressure.   3. The mitral valve is normal in structure. No evidence of mitral valve  regurgitation. No evidence of mitral stenosis.   4. The aortic valve is tricuspid. There is mild calcification of the  aortic valve. Aortic valve regurgitation is mild. No aortic stenosis is  present.   5. The inferior vena  cava is normal in size with greater than 50%  respiratory variability, suggesting right atrial pressure of 3 mmHg.   6. Technically difficult study with poor acoustic windows.   Bilateral LE Venous Doppler  09/15/2021: Summary:  RIGHT:  - There is no evidence of deep vein thrombosis in the lower extremity.     - No cystic structure found in the popliteal fossa.     LEFT:  - There is no evidence of deep vein thrombosis in the lower extremity.     - No cystic structure found in the popliteal fossa.  EKG:  EKG is personally reviewed and interpreted. 11/25/2021:  EKG was not ordered. 09/14/2021:  Recent Labs: 09/14/2021: ALT 13; B Natriuretic Peptide 110.4 09/15/2021: BUN 20; Creatinine, Ser 1.17; Hemoglobin 11.3; Magnesium 1.9; Platelets 181; Potassium 4.3; Sodium 141; TSH 2.472   Recent Lipid Panel    Component Value Date/Time   TRIG 124 04/18/2016 0447     Risk Assessment/Calculations:          Physical Exam:    VS:  BP (!) 154/64   Pulse 61   Ht 6' (1.829 m)   Wt 161 lb (73 kg)   SpO2 97%   BMI 21.84 kg/m     Wt Readings from Last 3 Encounters:  11/25/21 161 lb (73 kg)  09/14/21 168 lb 11.2 oz (76.5 kg)  05/27/21 164 lb (74.4 kg)     GEN: Well nourished, well developed in no acute distress HEENT: Normal NECK: No JVD; No carotid bruits LYMPHATICS: No lymphadenopathy CARDIAC: RRR, no murmurs, rubs, gallops RESPIRATORY:  Clear to auscultation without rales, wheezing or rhonchi  ABDOMEN: Soft, non-tender, non-distended MUSCULOSKELETAL:  No edema; No deformity  SKIN: Warm and dry NEUROLOGIC:  Alert and oriented x 3 PSYCHIATRIC:  Normal affect   ASSESSMENT:    1. Acute systolic CHF (congestive heart failure) (HCC)   2. Pure hypercholesterolemia   3. Medication management    PLAN:    In order of problems listed above:  Acute systolic heart failure - Recent hospitalization.  Discharge summary and other doctors notes reviewed. - Recent telmisartan  administration.  Agree.  Excellent choice with mildly reduced ejection fraction with likely combination of diastolic dysfunction as well.  Monitoring renal function closely. - I will go ahead and add low-dose spironolactone, MRA, to enhance his goal-directed medical therapy.  In 1 week we will check a basic metabolic profile.  I want to ensure that he does not have any evidence of hyperkalemia or significantly worsened creatinine. -Overall he seems to be doing quite well, NYHA class I-II symptoms.  Uses a walker for ambulation   Dementia - On Namenda and Depakote.  Chronic kidney disease stage IIIa - Continue to monitor closely.  Follow-up: 3 months with APP.  Medication Adjustments/Labs and Tests Ordered: Current medicines are reviewed at length with the patient today.  Concerns regarding medicines are outlined above.   Orders Placed This Encounter  Procedures   Basic metabolic panel   Meds ordered this encounter  Medications   spironolactone (ALDACTONE) 25 MG tablet    Sig: Take 0.5 tablets (12.5 mg total) by mouth daily.    Dispense:  45 tablet    Refill:  3   Patient Instructions  Medication Instructions:  Please start Spironolactone 25 mg - 1/2 tablet daily. Continue all other medications as listed.  *If you need a refill on your cardiac medications before your next appointment, please call your pharmacy*   Lab Work: Please have blood work in 1 week (BMP)  If you have labs (blood work) drawn today and your tests are completely normal, you will receive your results only by: MyChart Message (if you have MyChart) OR A paper copy in the mail If you have any lab test that is abnormal or we need to change your treatment, we will call you to review the results.  Follow-Up: At [facility], you and your health needs are our priority.  As part of our continuing mission to provide you with exceptional heart care, we have created designated Provider Care  Teams.  These  Care Teams include your primary Cardiologist (physician) and Advanced Practice Providers (APPs -  Physician Assistants and Nurse Practitioners) who all work together to provide you with the care you need, when you need it.  We recommend signing up for the patient portal called "MyChart".  Sign up information is provided on this After Visit Summary.  MyChart is used to connect with patients for Virtual Visits (Telemedicine).  Patients are able to view lab/test results, encounter notes, upcoming appointments, etc.  Non-urgent messages can be sent to your provider as well.   To learn more about what you can do with MyChart, go to [email or web address].    Your next appointment:   3 month(s)  The format for your next appointment:   In Person  Provider:   Dr Donato Schultz     Important Information About Sugar         I,Rachel Rivera,acting as a scribe for Donato Schultz, MD.,have documented all relevant documentation on the behalf of Donato Schultz, MD,as directed by  Donato Schultz, MD while in the presence of Donato Schultz, MD.  I, Donato Schultz, MD, have reviewed all documentation for this visit. The documentation on 11/25/21 for the exam, diagnosis, procedures, and orders are all accurate and complete.   Signed, Donato Schultz, MD  11/25/2021 4:42 PM    [facility]

## 2021-11-25 NOTE — Patient Instructions (Signed)
Medication Instructions:  Please start Spironolactone 25 mg - 1/2 tablet daily. Continue all other medications as listed.  *If you need a refill on your cardiac medications before your next appointment, please call your pharmacy*   Lab Work: Please have blood work in 1 week (BMP)  If you have labs (blood work) drawn today and your tests are completely normal, you will receive your results only by: [place] (if you have MyChart) OR A paper copy in the mail If you have any lab test that is abnormal or we need to change your treatment, we will call you to review the results.  Follow-Up: At [facility], you and your health needs are our priority.  As part of our continuing mission to provide you with exceptional heart care, we have created designated Provider Care Teams.  These Care Teams include your primary Cardiologist (physician) and Advanced Practice Providers (APPs -  Physician Assistants and Nurse Practitioners) who all work together to provide you with the care you need, when you need it.  We recommend signing up for the patient portal called "MyChart".  Sign up information is provided on this After Visit Summary.  MyChart is used to connect with patients for Virtual Visits (Telemedicine).  Patients are able to view lab/test results, encounter notes, upcoming appointments, etc.  Non-urgent messages can be sent to your provider as well.   To learn more about what you can do with MyChart, go to [email or web address].    Your next appointment:   3 month(s)  The format for your next appointment:   In Person  Provider:   Dr Candee Furbish     Important Information About Sugar

## 2021-12-02 ENCOUNTER — Other Ambulatory Visit: Payer: Medicare Other

## 2021-12-06 ENCOUNTER — Other Ambulatory Visit: Payer: Medicare Other

## 2021-12-07 ENCOUNTER — Encounter: Payer: Self-pay | Admitting: Cardiology

## 2021-12-07 ENCOUNTER — Ambulatory Visit: Payer: Medicare Other | Attending: Cardiology

## 2021-12-07 DIAGNOSIS — I5021 Acute systolic (congestive) heart failure: Secondary | ICD-10-CM | POA: Diagnosis not present

## 2021-12-07 DIAGNOSIS — Z79899 Other long term (current) drug therapy: Secondary | ICD-10-CM | POA: Diagnosis not present

## 2021-12-07 NOTE — Telephone Encounter (Signed)
Error

## 2021-12-08 LAB — BASIC METABOLIC PANEL
BUN/Creatinine Ratio: 17 (ref 10–24)
BUN: 23 mg/dL (ref 8–27)
CO2: 21 mmol/L (ref 20–29)
Calcium: 8.6 mg/dL (ref 8.6–10.2)
Chloride: 107 mmol/L — ABNORMAL HIGH (ref 96–106)
Creatinine, Ser: 1.33 mg/dL — ABNORMAL HIGH (ref 0.76–1.27)
Glucose: 123 mg/dL — ABNORMAL HIGH (ref 70–99)
Potassium: 4.8 mmol/L (ref 3.5–5.2)
Sodium: 144 mmol/L (ref 134–144)
eGFR: 54 mL/min/{1.73_m2} — ABNORMAL LOW (ref >59)

## 2021-12-10 ENCOUNTER — Encounter: Payer: Self-pay | Admitting: *Deleted

## 2022-01-12 ENCOUNTER — Other Ambulatory Visit: Payer: Self-pay | Admitting: Neurology

## 2022-01-13 DIAGNOSIS — N1831 Chronic kidney disease, stage 3a: Secondary | ICD-10-CM | POA: Diagnosis not present

## 2022-01-13 DIAGNOSIS — I2510 Atherosclerotic heart disease of native coronary artery without angina pectoris: Secondary | ICD-10-CM | POA: Diagnosis not present

## 2022-01-13 DIAGNOSIS — K219 Gastro-esophageal reflux disease without esophagitis: Secondary | ICD-10-CM | POA: Diagnosis not present

## 2022-01-13 DIAGNOSIS — E7800 Pure hypercholesterolemia, unspecified: Secondary | ICD-10-CM | POA: Diagnosis not present

## 2022-01-13 DIAGNOSIS — I10 Essential (primary) hypertension: Secondary | ICD-10-CM | POA: Diagnosis not present

## 2022-02-11 DIAGNOSIS — I10 Essential (primary) hypertension: Secondary | ICD-10-CM | POA: Diagnosis not present

## 2022-02-11 DIAGNOSIS — N1832 Chronic kidney disease, stage 3b: Secondary | ICD-10-CM | POA: Diagnosis not present

## 2022-02-11 DIAGNOSIS — K219 Gastro-esophageal reflux disease without esophagitis: Secondary | ICD-10-CM | POA: Diagnosis not present

## 2022-02-11 DIAGNOSIS — I5040 Unspecified combined systolic (congestive) and diastolic (congestive) heart failure: Secondary | ICD-10-CM | POA: Diagnosis not present

## 2022-02-11 DIAGNOSIS — E7800 Pure hypercholesterolemia, unspecified: Secondary | ICD-10-CM | POA: Diagnosis not present

## 2022-02-15 ENCOUNTER — Telehealth: Payer: Self-pay | Admitting: Cardiology

## 2022-02-15 MED ORDER — SPIRONOLACTONE 25 MG PO TABS: 12.5000 mg | ORAL_TABLET | Freq: Every day | ORAL | 2 refills | Status: DC

## 2022-02-15 NOTE — Telephone Encounter (Signed)
Pt's medication was sent to pt's pharmacy as requested. Confirmation received.  °

## 2022-02-15 NOTE — Telephone Encounter (Signed)
*  STAT* If patient is at the pharmacy, call can be transferred to refill team.   1. Which medications need to be refilled? (please list name of each medication and dose if known)   spironolactone (ALDACTONE) 25 MG tablet   2. Which pharmacy/location (including street and city if local pharmacy) is medication to be sent to?  [facility] - [place], [place] - [place] [address]   3. Do they need a 30 day or 90 day supply?   90 day  Caller stated the patient still has a few tablets left.

## 2022-03-03 ENCOUNTER — Ambulatory Visit: Payer: Medicare Other | Admitting: Cardiology

## 2022-03-03 NOTE — Progress Notes (Signed)
Office Visit    Patient Name: [name] Date of Encounter: 03/04/2022  PCP:  [name], MD   [facility]  Cardiologist:  Donato Schultz, MD  Advanced Practice Provider:  No care team member to display Electrophysiologist:  None   HPI    [name] is a 82 y.o. male with a past medical history significant for combined systolic/diastolic congestive heart failure with EF 45 to 50%, aspiration pneumonia, hyperlipidemia, hypertension presents today for follow-up appointment.  He was seen on 09/22/2021 by Dr. Orson Aloe.  Per review of his prior hospitalization there was reason to presume heart failure exacerbation.  Echocardiogram showed mild systolic dysfunction and grade 1 diastolic dysfunction.  BNP was only mildly elevated but he was fluid overloaded and responded well to diuresis.  Visit with Dr. Orson Aloe discussed discontinuing amlodipine and starting on telmisartan.  He was last seen 11/2021 and at that time his blood pressure had been elevated 154/64.  He had been taking Lasix for lower extremity edema which was much improved.  Working on lowering his salt intake.  Not on any supplemental potassium.  Today, he states that he is having more swelling on the left side due to a hip replacement. No chest pain or SOB. Otherwise he is feeling well. His BP has been elevated in the past but today it is well controlled. He uses a walker to get around. We ended up getting an EKG since his heart sounded irregular on auscultation. He does have a history of PACs so, we wanted to rule out Afib.  Reports no shortness of breath nor dyspnea on exertion. Reports no chest pain, pressure, or tightness. No orthopnea, PND. Reports no palpitations.   Past Medical History    Past Medical History:  Diagnosis Date   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 09/25/2015 09/25/2015   Aspiration pneumonia (HCC) 01/17/2016   Bilateral dry eyes    Bile  peritonitis due to gastric tube dislodgement 12/03/2015   BPH (benign prostatic hyperplasia)    Central pterygium of right eye    Chronic respiratory failure with hypoxia (HCC)    Coronary artery disease 1997, 2009   bare mental stent right coronart artery, occlusive followed by Dr. 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 [facility] in [place]   Peptic ulcer    Pneumatosis of intestines 12/11/2015   Pneumonia    HISTORY OF IN CHILDHOOD   Posterior vitreous detachment 10/30/2012   Pyloric stricture 11/24/2015   Salzmann's nodular dystrophy of left eye    SBO (small bowel obstruction) (HCC) 01/20/2016   Urinary hesitancy    Wears glasses    Past Surgical History:  Procedure Laterality Date   CARDIAC CATHETERIZATION     CHOLECYSTECTOMY     ESOPHAGOGASTRODUODENOSCOPY N/A 12/01/2015   Procedure: ESOPHAGOGASTRODUODENOSCOPY (EGD) BALLOON DILATION OF DUODENAL STRICTURE;  Surgeon: Karie Soda, MD;  Location: [facility];  Service: General;  Laterality: N/A;   ESOPHAGOGASTRODUODENOSCOPY N/A 12/14/2015  Procedure: ESOPHAGOGASTRODUODENOSCOPY (EGD);  Surgeon: Clarene Essex, MD;  Location: [facility];  Service: Endoscopy;  Laterality: N/A;  Patient has tracheostomy   ESOPHAGOGASTRODUODENOSCOPY (EGD) WITH PROPOFOL N/A 07/01/2015   Procedure: ESOPHAGOGASTRODUODENOSCOPY (EGD) WITH PROPOFOL;  Surgeon: Arta Silence, MD;  Location: [facility];  Service: Endoscopy;  Laterality: N/A;   ESOPHAGOGASTRODUODENOSCOPY (EGD) WITH PROPOFOL N/A 11/23/2015   Procedure:  ESOPHAGOGASTRODUODENOSCOPY (EGD) WITH PROPOFOL;  Surgeon: Arta Silence, MD;  Location: [facility];  Service: Endoscopy;  Laterality: N/A;  may need to intubate   EYE SURGERY     growth on right eye removed    HIP ARTHROPLASTY Left 07/07/2020   Procedure: ARTHROPLASTY BIPOLAR HIP (HEMIARTHROPLASTY);  Surgeon: Marchia Bond, MD;  Location: [facility];  Service: Orthopedics;  Laterality: Left;   IR GENERIC HISTORICAL  12/13/2015   IR [place] DUODEN/JEJUNO TUBE PERCUT W/FLUORO 12/13/2015 Marybelle Killings, MD [facility]   IR GENERIC HISTORICAL  02/03/2016   IR [place] TUBE PERCUT W/FLUORO 02/03/2016 Greggory Keen, MD [facility]   IR GENERIC HISTORICAL  02/04/2016   IR GASTR TUBE CONVERT GASTR-JEJ PER W/FL MOD SED 02/04/2016 Jacqulynn Cadet, MD [facility]   IR GENERIC HISTORICAL  02/25/2016   IR SINUS/FIST TUBE CHK-NON GI 02/25/2016 Aletta Edouard, MD [facility]   IR GENERIC HISTORICAL  03/18/2016   IR CM INJ ANY COLONIC TUBE W/FLUORO 03/18/2016 Sandi Mariscal, MD [facility]   IR GENERIC HISTORICAL  03/18/2016   IR GJ TUBE CHANGE 03/18/2016 Sandi Mariscal, MD [facility]   IR GENERIC HISTORICAL  03/22/2016   IR CM INJ ANY COLONIC TUBE W/FLUORO 03/22/2016 Sandi Mariscal, MD [facility]   IR GENERIC HISTORICAL  04/13/2016   IR CM INJ ANY COLONIC TUBE W/FLUORO 04/13/2016 Markus Daft, MD [facility]   LAPAROSCOPIC GASTROSTOMY N/A 12/01/2015   Procedure: LAPAROSCOPIC PLACEMENT OF FEEDING JEJUNOSTOMY AND GASTROSTOMY TUBE;  Surgeon: Michael Boston, MD;  Location: [facility];  Service: General;  Laterality: N/A;   LAPAROSCOPY N/A 12/03/2015   Procedure: LAPAROSCOPY DIAGNOSTIC, OMENTOPEXY, JEJUNOSTOMY, [place] OUT;  Surgeon: Michael Boston, MD;  Location: [facility];  Service: General;  Laterality: N/A;   LAPAROTOMY N/A 02/06/2016   Procedure: EXPLORATORY LAPAROTOMY, LYSIS OF ADHESIONS, SEGMENTAL SMALL BOWEL RESECTION, GASTROJEJUNOSTOMY;  Surgeon: Autumn Messing III, MD;  Location: [facility];  Service: General;  Laterality: N/A;    STENT TO HEART     2 1997 AND 1 [place] N/A 09/25/2015   Procedure: XI ROBOTIC ANTERIOR AND POSTERIOR VAGOTOMY, [place] I  ANASTOMOSIS [place], OMENTOPEXY  UPPER ENDOSCOPY;  Surgeon: Michael Boston, MD;  Location: [facility];  Service: General;  Laterality: N/A;    Allergies  Allergies  Allergen Reactions   Alfuzosin Other (See Comments)    Leg weakness    Lisinopril Cough   Lorazepam Other (See Comments)    "i don't like it"  Prefers Xanax or valium   Tamsulosin Hcl Other (See Comments)    Leg weakness   Uroxatral [Alfuzosin Hcl Er] Other (See Comments)    Leg weakness   EKGs/Labs/Other Studies Reviewed:   The following studies were reviewed today:  CTA Chest  09/15/2021: IMPRESSION: 1. Right lower lobe infiltrates compatible with pneumonia. Follow-up is recommended in 4-6 weeks to ensure resolution. 2. Negative for acute pulmonary embolism. 3. Aortic Atherosclerosis (ICD10-I70.0) and Emphysema (ICD10-J43.9).   Echo  09/15/2021:  1. Left ventricular ejection fraction, by estimation, is 45 to 50%. The  left ventricle has  mildly decreased function. The left ventricle  demonstrates global hypokinesis. There is mild left ventricular  hypertrophy. Left ventricular diastolic parameters  are consistent with Grade I diastolic dysfunction (impaired relaxation).   2. Right ventricular systolic function is normal. The right ventricular  size is normal. Tricuspid regurgitation signal is inadequate for assessing  PA pressure.   3. The mitral valve is normal in structure. No evidence of mitral valve  regurgitation. No evidence of mitral stenosis.   4. The aortic valve is tricuspid. There is mild calcification of the  aortic valve. Aortic valve regurgitation is mild. No aortic stenosis is  present.   5. The inferior vena cava is normal in size with greater than 50%  respiratory variability, suggesting right atrial pressure of  3 mmHg.   6. Technically difficult study with poor acoustic windows.    Bilateral LE Venous Doppler  09/15/2021: Summary:  RIGHT:  - There is no evidence of deep vein thrombosis in the lower extremity.     - No cystic structure found in the popliteal fossa.     LEFT:  - There is no evidence of deep vein thrombosis in the lower extremity.     - No cystic structure found in the popliteal fossa.   EKG:  EKG is  ordered today.  The ekg ordered today demonstrates NSR with premature supraventricular complexes, rate 78 bpm  Recent Labs: 09/14/2021: ALT 13; B Natriuretic Peptide 110.4 09/15/2021: Hemoglobin 11.3; Magnesium 1.9; Platelets 181; TSH 2.472 12/07/2021: BUN 23; Creatinine, Ser 1.33; Potassium 4.8; Sodium 144  Recent Lipid Panel    Component Value Date/Time   TRIG 124 04/18/2016 0447   Home Medications   Current Meds  Medication Sig   bismuth subsalicylate (PEPTO BISMOL) 262 MG/15ML suspension Take 30 mLs by mouth every 6 (six) hours as needed for indigestion.   cholestyramine (QUESTRAN) 4 GM/DOSE powder Take 4 g by mouth See admin instructions. Mix 4 grams of powder into a glass of orange juice and drink every morning   divalproex (DEPAKOTE) 125 MG DR tablet Take 250 mg by mouth 2 (two) times daily. Pt takes 2 tables 125mg .   furosemide (LASIX) 20 MG tablet Take 1 tablet (20 mg total) by mouth daily.   loperamide (IMODIUM) 2 MG capsule Take 2 mg by mouth daily.   memantine (NAMENDA) 10 MG tablet TAKE ONE TABLET BY MOUTH TWICE DAILY   Multiple Vitamin (MULTI-VITAMINS) TABS Take 1 tablet by mouth daily with breakfast.   nitroGLYCERIN (NITROSTAT) 0.4 MG SL tablet Place 1 tablet (0.4 mg total) under the tongue every 5 (five) minutes as needed for chest pain.   pantoprazole (PROTONIX) 40 MG tablet Take 40 mg by mouth daily before breakfast.   pravastatin (PRAVACHOL) 40 MG tablet Take 40 mg by mouth at bedtime.   spironolactone (ALDACTONE) 25 MG tablet Take 0.5 tablets (12.5 mg total)  by mouth daily.   telmisartan (MICARDIS) 40 MG tablet Take 40 mg by mouth daily.     Review of Systems      All other systems reviewed and are otherwise negative except as noted above.  Physical Exam    VS:  BP 112/60   Pulse 67   Ht 6' (1.829 m)   Wt 157 lb 12.8 oz (71.6 kg)   SpO2 97%   BMI 21.40 kg/m  , BMI Body mass index is 21.4 kg/m.  Wt Readings from Last 3 Encounters:  03/04/22 157 lb 12.8 oz (71.6 kg)  11/25/21 161 lb (73  kg)  09/14/21 168 lb 11.2 oz (76.5 kg)     GEN: Well nourished, well developed, in no acute distress. HEENT: normal. Neck: Supple, no JVD, carotid bruits, or masses. Cardiac: RRR with skipped beats, no murmurs, rubs, or gallops. No clubbing, cyanosis, edema.  Radials/PT 2+ and equal bilaterally.  Respiratory:  Respirations regular and unlabored, clear to auscultation bilaterally. GI: Soft, nontender, nondistended. MS: No deformity or atrophy. Skin: Warm and dry, no rash. Neuro:  Strength and sensation are intact. Psych: Normal affect.  Assessment & Plan    PACs -present on EKG today -continue current medications -asymptomatic  Acute systolic heart failure -small amount of LE edema today on exam -continue lasix 20mg  daily, spirolactone 12.5mg  daily -last creatinine 1.3  Dementia -stable, continue namenda -family member with him today  Hypertension -well controlled today 112/60 -continue current medications including Micardis, lasix, and spirolactone  Chronic kidney disease stage IIIa -stable -avoid nephrotoxic medications  6. LE edema -continue diuretics, compression, and elevation -avoid salt -maintain proper hydration       Disposition: Follow up 6 months with , MD or APP.  Signed, Donato Schultz, PA-C 03/04/2022, 4:33 PM [facility]

## 2022-03-04 ENCOUNTER — Ambulatory Visit: Payer: Medicare Other | Attending: Cardiology | Admitting: Physician Assistant

## 2022-03-04 ENCOUNTER — Encounter: Payer: Self-pay | Admitting: Physician Assistant

## 2022-03-04 VITALS — BP 112/60 | HR 67 | Ht 72.0 in | Wt 157.8 lb

## 2022-03-04 DIAGNOSIS — N1831 Chronic kidney disease, stage 3a: Secondary | ICD-10-CM

## 2022-03-04 DIAGNOSIS — I5021 Acute systolic (congestive) heart failure: Secondary | ICD-10-CM

## 2022-03-04 DIAGNOSIS — R600 Localized edema: Secondary | ICD-10-CM

## 2022-03-04 DIAGNOSIS — I5040 Unspecified combined systolic (congestive) and diastolic (congestive) heart failure: Secondary | ICD-10-CM

## 2022-03-04 DIAGNOSIS — Z79899 Other long term (current) drug therapy: Secondary | ICD-10-CM

## 2022-03-04 DIAGNOSIS — I5033 Acute on chronic diastolic (congestive) heart failure: Secondary | ICD-10-CM | POA: Diagnosis not present

## 2022-03-04 NOTE — Patient Instructions (Signed)
Medication Instructions:  Your physician recommends that you continue on your current medications as directed. Please refer to the Current Medication list given to you today.  *If you need a refill on your cardiac medications before your next appointment, please call your pharmacy*   Lab Work: None ordered If you have labs (blood work) drawn today and your tests are completely normal, you will receive your results only by: [place] (if you have MyChart) OR A paper copy in the mail If you have any lab test that is abnormal or we need to change your treatment, we will call you to review the results.   Follow-Up: At [facility], you and your health needs are our priority.  As part of our continuing mission to provide you with exceptional heart care, we have created designated Provider Care Teams.  These Care Teams include your primary Cardiologist (physician) and Advanced Practice Providers (APPs -  Physician Assistants and Nurse Practitioners) who all work together to provide you with the care you need, when you need it.  Your next appointment:   6 month(s)  Provider:   Candee Furbish, MD

## 2022-05-11 ENCOUNTER — Other Ambulatory Visit: Payer: Self-pay | Admitting: Neurology

## 2022-05-16 DIAGNOSIS — H2513 Age-related nuclear cataract, bilateral: Secondary | ICD-10-CM | POA: Diagnosis not present

## 2022-05-16 DIAGNOSIS — H5203 Hypermetropia, bilateral: Secondary | ICD-10-CM | POA: Diagnosis not present

## 2022-08-01 ENCOUNTER — Telehealth: Payer: Self-pay | Admitting: Cardiology

## 2022-08-01 NOTE — Telephone Encounter (Signed)
  Per MyChart scheduling message:  Pt c/o swelling: STAT is pt has developed SOB within 24 hours  If swelling, where is the swelling located?   How much weight have you gained and in what time span?   Have you gained 3 pounds in a day or 5 pounds in a week?   Do you have a log of your daily weights (if so, list)?   Are you currently taking a fluid pill?   Are you currently SOB?   Have you traveled recently?     Increased swelling in ankles/feet. Increased shortness of breath even when walking in the house. Weight gain of 3-5 lbs depending on the day despite increasing lasix and decreasing salt intake.

## 2022-08-01 NOTE — Telephone Encounter (Signed)
Called patient's daughter, DPR, back about message. Patient has been complaining of BLE edema, 3 to 4 lbs weight gain, and SOB with activity and just not feeling well for the last couple of weeks, daughter states this is unusual for patient. Last Wednesday, patient's PCP increase his lasix  to 40 mg. There has been no improvement with increase in lasix. PCP is out of the office this week when they tried to call PCP's office to update them. Patient's daughter describes edema as  2+ pitting edema. Patient has history of CHF and chronic renal failure. Patient is also on spironolactone 12.5 mg. Patient has not had any recent lab work either. Will forward to Dr. Anne Fu for advisement. Made patient an appointment to see Dr. Anne Fu next Friday for 6 month follow-up as well.

## 2022-08-01 NOTE — Telephone Encounter (Signed)
  Please call daughter at [phone].

## 2022-08-01 NOTE — Telephone Encounter (Signed)
Tried to call number provided. No answer. 

## 2022-08-10 NOTE — Telephone Encounter (Signed)
  Agree. Thanks Donato Schultz, MD    No new orders at this time.  Pt to keep appt as scheduled and call back prior to then if any questions or concerns.  Pt did reschedule his appt to 08/26/22 with [name], PA.

## 2022-08-12 ENCOUNTER — Ambulatory Visit: Payer: Medicare Other | Admitting: Cardiology

## 2022-08-25 NOTE — Progress Notes (Signed)
Cardiology Office Note:    Date:  08/26/2022  ID:  [name], DOB November 30, 1940, MRN [id number] PCP: [name], MD (Inactive)  [facility] Providers Cardiologist:  Donato Schultz, MD       Patient Profile:      Coronary artery disease  S/p BMS to the RCA 1997 S/p overlapping DES to LAD in 2009 LHC 11/30/2007: LAD proximal-mid 80-90, 50, D1 ostial 50; OM1 ostial 50; RI ostial 60; RCA mid stent patent with 50 ISR Myoview 05/09/2013: EF 57, no ischemia or scar, low risk HFmrEF (heart failure with mildly reduced ejection fraction)  TTE 09/15/2021: EF 45-50, global HK, mild LVH, GR 1 DD, normal RVSF, mild AI, mild AV calcification, RAP 3 PACs  Hypertension  Hyperlipidemia  Chronic kidney disease  Hx of aspiration pneumonia  Dementia  Leg edema Former smoker Aortic atherosclerosis Erectile dysfunction      History of Present Illness:   [name] is a 82 y.o. male who returns for follow-up of CAD, CHF.  He was last seen by Jari Favre, PA-C in January 2024.  He called in recently with increasing lower extremity edema.  He is here with his daughter. He has had leg edema since his admx to the hospital in 09/2021. Over the past few weeks it has gotten worse. He has also had worsening shortness of breath with exertion (walking down the hall). He sleeps on an incline for years. He has not had chest pain, syncope.   Review of Systems  Constitutional: Negative for fever.  Respiratory:  Positive for cough (chronic).   Gastrointestinal:  Negative for hematochezia and melena.  Genitourinary:  Negative for hematuria.   See HPI    Studies Reviewed:   EKG Interpretation Date/Time:  Friday August 26 2022 12:27:20 EDT Ventricular Rate:  64 PR Interval:  158 QRS Duration:  84 QT Interval:  416 QTC Calculation: 429 R Axis:   66  Text Interpretation: Normal sinus rhythm with frequent Premature atrial complexes Sinus arrhythmia Normal axis No STTW changes Confirmed by [name]  [phone]) on 08/26/2022 12:31:41 PM   Risk Assessment/Calculations:             Physical Exam:   VS:  BP 106/62   Pulse 76   Ht 6' (1.829 m)   Wt 159 lb 3.2 oz (72.2 kg)   SpO2 94%   BMI 21.59 kg/m    Wt Readings from Last 3 Encounters:  08/26/22 159 lb 3.2 oz (72.2 kg)  03/04/22 157 lb 12.8 oz (71.6 kg)  11/25/21 161 lb (73 kg)    Constitutional:      Appearance: Healthy appearance. Not in distress.  Neck:     Vascular: JVD normal.  Pulmonary:     Breath sounds: Normal breath sounds. No wheezing. No rales.  Cardiovascular:     Normal rate. Irregularly irregular rhythm.     Murmurs: There is no murmur.  Edema:    Peripheral edema present.    Pretibial: bilateral 1+ edema of the pretibial area.    Ankle: bilateral 2+ edema of the ankle. Abdominal:     Palpations: Abdomen is soft.  Skin:    General: Skin is warm and dry.      ASSESSMENT AND PLAN:   Heart failure with mildly reduced ejection fraction (HFmrEF) (HCC) EF 45-50 by Echocardiogram in 09/2021. He is NYHA IIb-III. He has had worsening leg edema recently. His lungs are clear and his neck veins are flat. However, his shortness  of breath is worse. He tried higher dose Lasix 40 mg once daily for 1 week without improvement.  DC Lasix Start Torsemide 20 mg once daily  Continue spironolactone 12.5 mg daily CMET, CBC, TSH, BNP today.  BMET 1 week Echocardiogram  Follow up 2 weeks  Consider SGLT2 inhibitor  CAD (coronary artery disease) Hx of BMS to the RCA in 1997, overlapping DES to the LAD in 2009. Myoview in 2015 low risk. He is not having chest pain to suggest angina. He is more short of breath with exertion. If he continues to have symptoms despite euvolemic status, we could consider stress testing. ECG today w/o acute changes. Unclear why he is no longer taking ASA - revisit at next appt Continue pravastatin 40 mg daily  Essential hypertension Blood pressure running somewhat low.  He has had some  lightheadedness.  Decrease telmisartan to 20 mg daily.  Lower extremity edema I suspect his edema is due to volume excess. Proceed with changing Lasix to Torsemide, obtain echocardiogram. Will also get TSH to r/o thyroid disease, CMET to r/o hypoalbuminemia, CBC to r/o anemia.     Dispo:  Return in about 2 weeks (around 09/09/2022) for Routine Follow Up w Dr. Anne Fu, or [name], PA-C.  Signed, [name], PA-C

## 2022-08-26 ENCOUNTER — Encounter: Payer: Self-pay | Admitting: Physician Assistant

## 2022-08-26 ENCOUNTER — Ambulatory Visit: Payer: Medicare Other | Attending: Cardiology | Admitting: Physician Assistant

## 2022-08-26 VITALS — BP 106/62 | HR 76 | Ht 72.0 in | Wt 159.2 lb

## 2022-08-26 DIAGNOSIS — R0602 Shortness of breath: Secondary | ICD-10-CM | POA: Diagnosis not present

## 2022-08-26 DIAGNOSIS — I25118 Atherosclerotic heart disease of native coronary artery with other forms of angina pectoris: Secondary | ICD-10-CM | POA: Diagnosis not present

## 2022-08-26 DIAGNOSIS — R600 Localized edema: Secondary | ICD-10-CM

## 2022-08-26 DIAGNOSIS — I10 Essential (primary) hypertension: Secondary | ICD-10-CM

## 2022-08-26 DIAGNOSIS — I5022 Chronic systolic (congestive) heart failure: Secondary | ICD-10-CM | POA: Diagnosis not present

## 2022-08-26 LAB — CBC
Hematocrit: 37.7 % (ref 37.5–51.0)
Hemoglobin: 12.5 g/dL — ABNORMAL LOW (ref 13.0–17.7)
MCH: 33.7 pg — ABNORMAL HIGH (ref 26.6–33.0)
Platelets: 281 10*3/uL (ref 150–450)
RDW: 11.2 % — ABNORMAL LOW (ref 11.6–15.4)
WBC: 8.5 10*3/uL (ref 3.4–10.8)

## 2022-08-26 LAB — COMPREHENSIVE METABOLIC PANEL

## 2022-08-26 LAB — PRO B NATRIURETIC PEPTIDE

## 2022-08-26 MED ORDER — TELMISARTAN 20 MG PO TABS: 20.0000 mg | ORAL_TABLET | Freq: Every day | ORAL | 3 refills | Status: DC

## 2022-08-26 MED ORDER — TORSEMIDE 20 MG PO TABS: 20.0000 mg | ORAL_TABLET | Freq: Every day | ORAL | 3 refills | Status: DC

## 2022-08-26 NOTE — Assessment & Plan Note (Signed)
I suspect his edema is due to volume excess. Proceed with changing Lasix to Torsemide, obtain echocardiogram. Will also get TSH to r/o thyroid disease, CMET to r/o hypoalbuminemia, CBC to r/o anemia.

## 2022-08-26 NOTE — Assessment & Plan Note (Signed)
Blood pressure running somewhat low.  He has had some lightheadedness.  Decrease telmisartan to 20 mg daily.

## 2022-08-26 NOTE — Assessment & Plan Note (Addendum)
EF 45-50 by Echocardiogram in 09/2021. He is NYHA IIb-III. He has had worsening leg edema recently. His lungs are clear and his neck veins are flat. However, his shortness of breath is worse. He tried higher dose Lasix 40 mg once daily for 1 week without improvement.  DC Lasix Start Torsemide 20 mg once daily  Continue spironolactone 12.5 mg daily CMET, CBC, TSH, BNP today.  BMET 1 week Echocardiogram  Follow up 2 weeks  Consider SGLT2 inhibitor

## 2022-08-26 NOTE — Patient Instructions (Signed)
Medication Instructions:  Your physician has recommended you make the following change in your medication:   STOP Lasix  START Torsemide 20 mg taking 1 daily  REDUCE the Micardis to 20 mg taking only 1 daily *If you need a refill on your cardiac medications before your next appointment, please call your pharmacy*   Lab Work: TODAY:  CMET, CBC, TSH, & PRO BNP  1 WEEK, 09/02/22:  COME ANYTIME AFTER 7:15 AM FOR BMET  If you have labs (blood work) drawn today and your tests are completely normal, you will receive your results only by: MyChart Message (if you have MyChart) OR A paper copy in the mail If you have any lab test that is abnormal or we need to change your treatment, we will call you to review the results.   Testing/Procedures: Your physician has requested that you have an echocardiogram BEFORE 09/20/22. Echocardiography is a painless test that uses sound waves to create images of your heart. It provides your doctor with information about the size and shape of your heart and how well your heart's chambers and valves are working. This procedure takes approximately one hour. There are no restrictions for this procedure. Please do NOT wear cologne, perfume, aftershave, or lotions (deodorant is allowed). Please arrive 15 minutes prior to your appointment time.    Follow-Up: At [facility], you and your health needs are our priority.  As part of our continuing mission to provide you with exceptional heart care, we have created designated Provider Care Teams.  These Care Teams include your primary Cardiologist (physician) and Advanced Practice Providers (APPs -  Physician Assistants and Nurse Practitioners) who all work together to provide you with the care you need, when you need it.  We recommend signing up for the patient portal called "MyChart".  Sign up information is provided on this After Visit Summary.  MyChart is used to connect with patients for Virtual Visits  (Telemedicine).  Patients are able to view lab/test results, encounter notes, upcoming appointments, etc.  Non-urgent messages can be sent to your provider as well.   To learn more about what you can do with MyChart, go to [email or web address].    Your next appointment:   09/20/22   Provider:   Donato Schultz, MD     Other Instructions

## 2022-08-26 NOTE — Assessment & Plan Note (Signed)
Hx of BMS to the RCA in 1997, overlapping DES to the LAD in 2009. Myoview in 2015 low risk. He is not having chest pain to suggest angina. He is more short of breath with exertion. If he continues to have symptoms despite euvolemic status, we could consider stress testing. ECG today w/o acute changes. Unclear why he is no longer taking ASA - revisit at next appt Continue pravastatin 40 mg daily

## 2022-08-27 LAB — CBC
MCHC: 33.2 g/dL (ref 31.5–35.7)
MCV: 102 fL — ABNORMAL HIGH (ref 79–97)
RBC: 3.71 x10E6/uL — ABNORMAL LOW (ref 4.14–5.80)

## 2022-08-27 LAB — COMPREHENSIVE METABOLIC PANEL
ALT: 14 IU/L (ref 0–44)
Albumin: 3.8 g/dL (ref 3.7–4.7)
BUN/Creatinine Ratio: 20 (ref 10–24)
Chloride: 102 mmol/L (ref 96–106)
Creatinine, Ser: 1.53 mg/dL — ABNORMAL HIGH (ref 0.76–1.27)
Potassium: 4.8 mmol/L (ref 3.5–5.2)
Total Protein: 6.5 g/dL (ref 6.0–8.5)
eGFR: 45 mL/min/{1.73_m2} — ABNORMAL LOW (ref >59)

## 2022-08-27 LAB — TSH: TSH: 7.14 u[IU]/mL — ABNORMAL HIGH (ref 0.450–4.500)

## 2022-09-02 ENCOUNTER — Ambulatory Visit: Payer: Medicare Other

## 2022-09-05 ENCOUNTER — Ambulatory Visit: Payer: Medicare Other | Attending: Physician Assistant

## 2022-09-05 DIAGNOSIS — R0602 Shortness of breath: Secondary | ICD-10-CM | POA: Diagnosis not present

## 2022-09-05 DIAGNOSIS — I25118 Atherosclerotic heart disease of native coronary artery with other forms of angina pectoris: Secondary | ICD-10-CM | POA: Diagnosis not present

## 2022-09-05 DIAGNOSIS — I5022 Chronic systolic (congestive) heart failure: Secondary | ICD-10-CM

## 2022-09-06 ENCOUNTER — Telehealth: Payer: Self-pay | Admitting: *Deleted

## 2022-09-06 DIAGNOSIS — Z79899 Other long term (current) drug therapy: Secondary | ICD-10-CM

## 2022-09-06 LAB — BASIC METABOLIC PANEL
BUN/Creatinine Ratio: 34 — ABNORMAL HIGH (ref 10–24)
BUN: 56 mg/dL — ABNORMAL HIGH (ref 8–27)
CO2: 23 mmol/L (ref 20–29)
Calcium: 8.8 mg/dL (ref 8.6–10.2)
Chloride: 103 mmol/L (ref 96–106)
Creatinine, Ser: 1.64 mg/dL — ABNORMAL HIGH (ref 0.76–1.27)
Glucose: 82 mg/dL (ref 70–99)
Potassium: 4.5 mmol/L (ref 3.5–5.2)
Sodium: 143 mmol/L (ref 134–144)
eGFR: 42 mL/min/{1.73_m2} — ABNORMAL LOW (ref >59)

## 2022-09-06 MED ORDER — TORSEMIDE 20 MG PO TABS: 10.0000 mg | ORAL_TABLET | ORAL | Status: DC

## 2022-09-06 NOTE — Telephone Encounter (Signed)
-----   Message from [name] sent at 09/06/2022  9:45 AM EDT ----- Creatinine increased slightly. K+ normal. Pt was to take Torsemide 20 mg once daily x 3 days, then 20 mg every other day. Med list has Torsemide 20 mg once daily  PLAN:  -If pt taking Torsemide 20 mg every day, change to every Mon, Wed, Fri -If pt taking Torsemide 20 mg ever other day, decrease Torsemide to 10 mg every other day -BMET 1 week [name], PA-C    09/06/2022 9:43 AM

## 2022-09-13 ENCOUNTER — Ambulatory Visit: Payer: Medicare Other

## 2022-09-15 ENCOUNTER — Ambulatory Visit: Payer: Medicare Other | Attending: Physician Assistant

## 2022-09-19 ENCOUNTER — Ambulatory Visit (HOSPITAL_COMMUNITY): Payer: Medicare Other | Attending: Internal Medicine

## 2022-09-19 ENCOUNTER — Ambulatory Visit: Payer: Medicare Other

## 2022-09-19 DIAGNOSIS — I5022 Chronic systolic (congestive) heart failure: Secondary | ICD-10-CM | POA: Insufficient documentation

## 2022-09-19 DIAGNOSIS — I25118 Atherosclerotic heart disease of native coronary artery with other forms of angina pectoris: Secondary | ICD-10-CM | POA: Diagnosis not present

## 2022-09-19 DIAGNOSIS — Z79899 Other long term (current) drug therapy: Secondary | ICD-10-CM

## 2022-09-19 DIAGNOSIS — R0602 Shortness of breath: Secondary | ICD-10-CM | POA: Diagnosis not present

## 2022-09-19 LAB — ECHOCARDIOGRAM COMPLETE
Area-P 1/2: 2.58 cm2
S' Lateral: 3 cm

## 2022-09-20 ENCOUNTER — Encounter: Payer: Self-pay | Admitting: Cardiology

## 2022-09-20 ENCOUNTER — Ambulatory Visit: Payer: Medicare Other | Attending: Cardiology | Admitting: Cardiology

## 2022-09-20 VITALS — BP 102/50 | HR 72 | Ht 72.0 in | Wt 160.8 lb

## 2022-09-20 DIAGNOSIS — I5022 Chronic systolic (congestive) heart failure: Secondary | ICD-10-CM

## 2022-09-20 DIAGNOSIS — I25118 Atherosclerotic heart disease of native coronary artery with other forms of angina pectoris: Secondary | ICD-10-CM

## 2022-09-20 MED ORDER — TORSEMIDE 20 MG PO TABS: 20.0000 mg | ORAL_TABLET | Freq: Every day | ORAL | 3 refills | Status: DC

## 2022-09-20 NOTE — Progress Notes (Signed)
  Cardiology Office Note:  .   Date:  09/20/2022  ID:  [name], DOB November 23, 1940, MRN [id number] PCP: Danny Aspen, MD  [facility] Providers Cardiologist:  Danny Schultz, MD    History of Present Illness: .   [name] is a 82 y.o. male here for follow-up coronary artery disease as well as congestive heart failure.  Recently saw Tereso [name] about 3 weeks ago.  Has had increasing lower extremity edema.  Was admitted to the hospital back in August 2023.  Sleeps on an incline for years.  No chest pain no syncope.  Cough chronic.  Memory impairment-medications reviewed.  Still having pedal edema.  No significant shortness of breath at this point.  Daughter helps with historical items  ROS: No syncope.  Low normal blood pressure.  Studies Reviewed: [name] Kitchen        Prior EKG on August 26, 2022 Risk Assessment/Calculations:            Physical Exam:   VS:  BP (!) 102/50   Pulse 72   Ht 6' (1.829 m)   Wt 160 lb 12.8 oz (72.9 kg)   SpO2 97%   BMI 21.81 kg/m    Wt Readings from Last 3 Encounters:  09/20/22 160 lb 12.8 oz (72.9 kg)  08/26/22 159 lb 3.2 oz (72.2 kg)  03/04/22 157 lb 12.8 oz (71.6 kg)    GEN: Well nourished, well developed in no acute distress, fairly quiet, daughter helps with questions NECK: No JVD; No carotid bruits CARDIAC: RRR, no murmurs, rubs, gallops RESPIRATORY:  Clear to auscultation without rales, wheezing or rhonchi  ABDOMEN: Soft, non-tender, non-distended EXTREMITIES: 3+ pedal/lower extremity edema; No deformity   ASSESSMENT AND PLAN: .    Acute systolic heart failure CKD stage IIIa - EF 45 to 50% mildly reduced August 2023.  Echocardiogram most recently showed normal ejection fraction.  Leg edema. - Torsemide 10 mg every other day with spironolactone 12.5 mg a day were utilized at last visit 3 weeks ago. -She still has pedal edema bilaterally.  Creatinine was stable at 1.65.  Potassium in the 4 range. -Lets go ahead and give him  a full tablet of torsemide 20 mg daily over the next week and then cut that back to a half a tablet daily or 10 mg after that. -Continue with compression as well. --Stop telmisartan 20mg   Spoke with daughter about plan.      Dispo: 1 month follow-up with APP  Signed, Danny Schultz, MD

## 2022-09-20 NOTE — Patient Instructions (Addendum)
Medication Instructions:  Please discontinue your Telmisartan. Take Torsemide 20 mg - 1 tablet daily for 1 week then decrease to 1/2 tablet daily thereafter. Continue all other medications as listed.  *If you need a refill on your cardiac medications before your next appointment, please call your pharmacy*  Follow-Up: At [facility], you and your health needs are our priority.  As part of our continuing mission to provide you with exceptional heart care, we have created designated Provider Care Teams.  These Care Teams include your primary Cardiologist (physician) and Advanced Practice Providers (APPs -  Physician Assistants and Nurse Practitioners) who all work together to provide you with the care you need, when you need it.  We recommend signing up for the patient portal called "MyChart".  Sign up information is provided on this After Visit Summary.  MyChart is used to connect with patients for Virtual Visits (Telemedicine).  Patients are able to view lab/test results, encounter notes, upcoming appointments, etc.  Non-urgent messages can be sent to your provider as well.   To learn more about what you can do with MyChart, go to [email or web address].    Your next appointment:   1 month(s)  Provider:   Jari Favre, PA-C, Robin Searing, NP, Jacolyn Reedy, PA-C, Eligha Bridegroom, NP, [name], PA-C, or Perlie Gold, PA-C

## 2022-09-21 ENCOUNTER — Other Ambulatory Visit: Payer: Self-pay

## 2022-09-21 DIAGNOSIS — I77810 Thoracic aortic ectasia: Secondary | ICD-10-CM

## 2022-09-21 NOTE — Progress Notes (Signed)
Weaver, Scott T, PA-C  P Cv Div [facility] Triage  PLAN: -Arrange repeat echo in 1 year (dilated ascending aorta)  Order placed as requested.

## 2022-10-05 DIAGNOSIS — I2510 Atherosclerotic heart disease of native coronary artery without angina pectoris: Secondary | ICD-10-CM | POA: Diagnosis not present

## 2022-10-05 DIAGNOSIS — I130 Hypertensive heart and chronic kidney disease with heart failure and stage 1 through stage 4 chronic kidney disease, or unspecified chronic kidney disease: Secondary | ICD-10-CM | POA: Diagnosis not present

## 2022-10-05 DIAGNOSIS — Z9989 Dependence on other enabling machines and devices: Secondary | ICD-10-CM | POA: Diagnosis not present

## 2022-10-05 DIAGNOSIS — I5040 Unspecified combined systolic (congestive) and diastolic (congestive) heart failure: Secondary | ICD-10-CM | POA: Diagnosis not present

## 2022-10-05 DIAGNOSIS — R197 Diarrhea, unspecified: Secondary | ICD-10-CM | POA: Diagnosis not present

## 2022-10-05 DIAGNOSIS — K219 Gastro-esophageal reflux disease without esophagitis: Secondary | ICD-10-CM | POA: Diagnosis not present

## 2022-10-05 DIAGNOSIS — I77810 Thoracic aortic ectasia: Secondary | ICD-10-CM | POA: Diagnosis not present

## 2022-10-05 DIAGNOSIS — E7800 Pure hypercholesterolemia, unspecified: Secondary | ICD-10-CM | POA: Diagnosis not present

## 2022-10-05 DIAGNOSIS — N1831 Chronic kidney disease, stage 3a: Secondary | ICD-10-CM | POA: Diagnosis not present

## 2022-10-14 ENCOUNTER — Other Ambulatory Visit: Payer: Self-pay | Admitting: Neurology

## 2022-10-14 ENCOUNTER — Other Ambulatory Visit: Payer: Self-pay | Admitting: Cardiology

## 2022-10-20 ENCOUNTER — Other Ambulatory Visit: Payer: Self-pay | Admitting: Neurology

## 2022-10-26 ENCOUNTER — Ambulatory Visit: Payer: Medicare Other | Attending: Cardiology | Admitting: Cardiology

## 2022-10-27 ENCOUNTER — Encounter: Payer: Self-pay | Admitting: Cardiology

## 2022-11-21 ENCOUNTER — Telehealth: Payer: Self-pay | Admitting: Neurology

## 2022-11-21 NOTE — Telephone Encounter (Signed)
Called and spoke to [name] informing her. She stated that she will call the PCP for the refills.

## 2022-11-21 NOTE — Telephone Encounter (Signed)
[name] Blackmon called on behalf of the pt to request refills for the pt's memantine (NAMENDA) 10 MG tablet and his divalproex (DEPAKOTE) 125 MG DR tablet. If there are any questions please call back.

## 2022-11-21 NOTE — Telephone Encounter (Signed)
Phone room: Please let pt know that refills should now come from pcp

## 2023-01-04 DIAGNOSIS — E7800 Pure hypercholesterolemia, unspecified: Secondary | ICD-10-CM | POA: Diagnosis not present

## 2023-01-04 DIAGNOSIS — I2510 Atherosclerotic heart disease of native coronary artery without angina pectoris: Secondary | ICD-10-CM | POA: Diagnosis not present

## 2023-01-04 DIAGNOSIS — I77810 Thoracic aortic ectasia: Secondary | ICD-10-CM | POA: Diagnosis not present

## 2023-01-04 DIAGNOSIS — Z23 Encounter for immunization: Secondary | ICD-10-CM | POA: Diagnosis not present

## 2023-01-04 DIAGNOSIS — I130 Hypertensive heart and chronic kidney disease with heart failure and stage 1 through stage 4 chronic kidney disease, or unspecified chronic kidney disease: Secondary | ICD-10-CM | POA: Diagnosis not present

## 2023-01-04 DIAGNOSIS — N1831 Chronic kidney disease, stage 3a: Secondary | ICD-10-CM | POA: Diagnosis not present

## 2023-01-04 DIAGNOSIS — K9089 Other intestinal malabsorption: Secondary | ICD-10-CM | POA: Diagnosis not present

## 2023-01-04 DIAGNOSIS — R600 Localized edema: Secondary | ICD-10-CM | POA: Diagnosis not present

## 2023-01-04 DIAGNOSIS — Z0000 Encounter for general adult medical examination without abnormal findings: Secondary | ICD-10-CM | POA: Diagnosis not present

## 2023-01-04 DIAGNOSIS — I5040 Unspecified combined systolic (congestive) and diastolic (congestive) heart failure: Secondary | ICD-10-CM | POA: Diagnosis not present

## 2023-02-15 ENCOUNTER — Inpatient Hospital Stay (HOSPITAL_COMMUNITY)
Admission: EM | Admit: 2023-02-15 | Discharge: 2023-02-21 | DRG: 206 | Disposition: A | Discharge status: Skilled Nursing Facility | Payer: Medicare Other | Attending: Internal Medicine | Admitting: Internal Medicine

## 2023-02-15 ENCOUNTER — Emergency Department (HOSPITAL_COMMUNITY): Payer: Medicare Other

## 2023-02-15 ENCOUNTER — Encounter (HOSPITAL_COMMUNITY): Payer: Self-pay | Admitting: *Deleted

## 2023-02-15 ENCOUNTER — Other Ambulatory Visit: Payer: Self-pay

## 2023-02-15 DIAGNOSIS — I130 Hypertensive heart and chronic kidney disease with heart failure and stage 1 through stage 4 chronic kidney disease, or unspecified chronic kidney disease: Secondary | ICD-10-CM | POA: Diagnosis present

## 2023-02-15 DIAGNOSIS — E782 Mixed hyperlipidemia: Secondary | ICD-10-CM | POA: Diagnosis not present

## 2023-02-15 DIAGNOSIS — S199XXA Unspecified injury of neck, initial encounter: Secondary | ICD-10-CM | POA: Diagnosis not present

## 2023-02-15 DIAGNOSIS — N1831 Chronic kidney disease, stage 3a: Secondary | ICD-10-CM | POA: Diagnosis not present

## 2023-02-15 DIAGNOSIS — M79604 Pain in right leg: Secondary | ICD-10-CM | POA: Diagnosis present

## 2023-02-15 DIAGNOSIS — Z8249 Family history of ischemic heart disease and other diseases of the circulatory system: Secondary | ICD-10-CM

## 2023-02-15 DIAGNOSIS — S3991XA Unspecified injury of abdomen, initial encounter: Secondary | ICD-10-CM | POA: Diagnosis not present

## 2023-02-15 DIAGNOSIS — Z79899 Other long term (current) drug therapy: Secondary | ICD-10-CM | POA: Diagnosis not present

## 2023-02-15 DIAGNOSIS — S32592A Other specified fracture of left pubis, initial encounter for closed fracture: Secondary | ICD-10-CM | POA: Diagnosis not present

## 2023-02-15 DIAGNOSIS — I2510 Atherosclerotic heart disease of native coronary artery without angina pectoris: Secondary | ICD-10-CM | POA: Diagnosis not present

## 2023-02-15 DIAGNOSIS — I7143 Infrarenal abdominal aortic aneurysm, without rupture: Secondary | ICD-10-CM | POA: Diagnosis present

## 2023-02-15 DIAGNOSIS — Z888 Allergy status to other drugs, medicaments and biological substances status: Secondary | ICD-10-CM | POA: Diagnosis not present

## 2023-02-15 DIAGNOSIS — W19XXXA Unspecified fall, initial encounter: Principal | ICD-10-CM | POA: Insufficient documentation

## 2023-02-15 DIAGNOSIS — N1832 Chronic kidney disease, stage 3b: Secondary | ICD-10-CM | POA: Diagnosis present

## 2023-02-15 DIAGNOSIS — S79922A Unspecified injury of left thigh, initial encounter: Secondary | ICD-10-CM | POA: Diagnosis not present

## 2023-02-15 DIAGNOSIS — R627 Adult failure to thrive: Secondary | ICD-10-CM | POA: Diagnosis not present

## 2023-02-15 DIAGNOSIS — J432 Centrilobular emphysema: Secondary | ICD-10-CM | POA: Diagnosis not present

## 2023-02-15 DIAGNOSIS — I5032 Chronic diastolic (congestive) heart failure: Secondary | ICD-10-CM | POA: Diagnosis present

## 2023-02-15 DIAGNOSIS — Y92002 Bathroom of unspecified non-institutional (private) residence single-family (private) house as the place of occurrence of the external cause: Secondary | ICD-10-CM

## 2023-02-15 DIAGNOSIS — M25552 Pain in left hip: Secondary | ICD-10-CM | POA: Diagnosis not present

## 2023-02-15 DIAGNOSIS — N400 Enlarged prostate without lower urinary tract symptoms: Secondary | ICD-10-CM | POA: Diagnosis present

## 2023-02-15 DIAGNOSIS — Z87891 Personal history of nicotine dependence: Secondary | ICD-10-CM | POA: Diagnosis not present

## 2023-02-15 DIAGNOSIS — H11021 Central pterygium of right eye: Secondary | ICD-10-CM | POA: Diagnosis not present

## 2023-02-15 DIAGNOSIS — M48061 Spinal stenosis, lumbar region without neurogenic claudication: Secondary | ICD-10-CM | POA: Diagnosis not present

## 2023-02-15 DIAGNOSIS — M545 Low back pain, unspecified: Secondary | ICD-10-CM | POA: Diagnosis not present

## 2023-02-15 DIAGNOSIS — J9611 Chronic respiratory failure with hypoxia: Secondary | ICD-10-CM | POA: Diagnosis not present

## 2023-02-15 DIAGNOSIS — D539 Nutritional anemia, unspecified: Secondary | ICD-10-CM | POA: Diagnosis present

## 2023-02-15 DIAGNOSIS — K219 Gastro-esophageal reflux disease without esophagitis: Secondary | ICD-10-CM | POA: Diagnosis present

## 2023-02-15 DIAGNOSIS — Z66 Do not resuscitate: Secondary | ICD-10-CM | POA: Diagnosis not present

## 2023-02-15 DIAGNOSIS — S2231XA Fracture of one rib, right side, initial encounter for closed fracture: Principal | ICD-10-CM | POA: Diagnosis present

## 2023-02-15 DIAGNOSIS — M79652 Pain in left thigh: Secondary | ICD-10-CM | POA: Diagnosis not present

## 2023-02-15 DIAGNOSIS — Z96642 Presence of left artificial hip joint: Secondary | ICD-10-CM | POA: Diagnosis present

## 2023-02-15 DIAGNOSIS — E785 Hyperlipidemia, unspecified: Secondary | ICD-10-CM | POA: Diagnosis not present

## 2023-02-15 DIAGNOSIS — S2232XA Fracture of one rib, left side, initial encounter for closed fracture: Secondary | ICD-10-CM | POA: Diagnosis not present

## 2023-02-15 DIAGNOSIS — I700 Atherosclerosis of aorta: Secondary | ICD-10-CM | POA: Diagnosis not present

## 2023-02-15 DIAGNOSIS — R262 Difficulty in walking, not elsewhere classified: Secondary | ICD-10-CM | POA: Diagnosis not present

## 2023-02-15 DIAGNOSIS — S0990XA Unspecified injury of head, initial encounter: Secondary | ICD-10-CM | POA: Diagnosis not present

## 2023-02-15 DIAGNOSIS — S20212A Contusion of left front wall of thorax, initial encounter: Secondary | ICD-10-CM | POA: Diagnosis not present

## 2023-02-15 DIAGNOSIS — S7002XA Contusion of left hip, initial encounter: Principal | ICD-10-CM | POA: Diagnosis present

## 2023-02-15 DIAGNOSIS — J439 Emphysema, unspecified: Secondary | ICD-10-CM | POA: Diagnosis not present

## 2023-02-15 DIAGNOSIS — M47816 Spondylosis without myelopathy or radiculopathy, lumbar region: Secondary | ICD-10-CM | POA: Diagnosis not present

## 2023-02-15 DIAGNOSIS — W010XXA Fall on same level from slipping, tripping and stumbling without subsequent striking against object, initial encounter: Secondary | ICD-10-CM | POA: Diagnosis present

## 2023-02-15 DIAGNOSIS — F03B0 Unspecified dementia, moderate, without behavioral disturbance, psychotic disturbance, mood disturbance, and anxiety: Secondary | ICD-10-CM | POA: Diagnosis present

## 2023-02-15 DIAGNOSIS — R6889 Other general symptoms and signs: Secondary | ICD-10-CM | POA: Diagnosis not present

## 2023-02-15 DIAGNOSIS — Z043 Encounter for examination and observation following other accident: Secondary | ICD-10-CM | POA: Diagnosis not present

## 2023-02-15 LAB — CBC WITH DIFFERENTIAL/PLATELET
Abs Immature Granulocytes: 0.05 10*3/uL (ref 0.00–0.07)
Basophils Absolute: 0.1 10*3/uL (ref 0.0–0.1)
Basophils Relative: 1 %
Eosinophils Absolute: 0 10*3/uL (ref 0.0–0.5)
Eosinophils Relative: 1 %
HCT: 39.9 % (ref 39.0–52.0)
Hemoglobin: 12.4 g/dL — ABNORMAL LOW (ref 13.0–17.0)
Immature Granulocytes: 1 %
Lymphocytes Relative: 17 %
Lymphs Abs: 1.5 10*3/uL (ref 0.7–4.0)
MCH: 32.9 pg (ref 26.0–34.0)
MCHC: 31.1 g/dL (ref 30.0–36.0)
MCV: 105.8 fL — ABNORMAL HIGH (ref 80.0–100.0)
Monocytes Absolute: 0.8 10*3/uL (ref 0.1–1.0)
Monocytes Relative: 9 %
Neutro Abs: 6.2 10*3/uL (ref 1.7–7.7)
Neutrophils Relative %: 71 %
Platelets: 176 10*3/uL (ref 150–400)
RBC: 3.77 MIL/uL — ABNORMAL LOW (ref 4.22–5.81)
RDW: 12.3 % (ref 11.5–15.5)
WBC: 8.6 10*3/uL (ref 4.0–10.5)
nRBC: 0 % (ref 0.0–0.2)

## 2023-02-15 LAB — BASIC METABOLIC PANEL
Anion gap: 5 (ref 5–15)
BUN: 32 mg/dL — ABNORMAL HIGH (ref 8–23)
CO2: 25 mmol/L (ref 22–32)
Calcium: 8.5 mg/dL — ABNORMAL LOW (ref 8.9–10.3)
Chloride: 107 mmol/L (ref 98–111)
Creatinine, Ser: 1.34 mg/dL — ABNORMAL HIGH (ref 0.61–1.24)
GFR, Estimated: 53 mL/min — ABNORMAL LOW (ref >60)
Glucose, Bld: 74 mg/dL (ref 70–99)
Potassium: 4.4 mmol/L (ref 3.5–5.1)
Sodium: 137 mmol/L (ref 135–145)

## 2023-02-15 LAB — MAGNESIUM: Magnesium: 2.4 mg/dL (ref 1.7–2.4)

## 2023-02-15 MED ORDER — ONDANSETRON HCL 4 MG/2ML IJ SOLN: 4.0000 mg | Freq: Four times a day (QID) | INTRAMUSCULAR | Status: DC | PRN

## 2023-02-15 MED ORDER — DIVALPROEX SODIUM 250 MG PO DR TAB
250.0000 mg | DELAYED_RELEASE_TABLET | Freq: Two times a day (BID) | ORAL | Status: DC
  Administered 2023-02-15 – 2023-02-21 (×12): 250 mg via ORAL
  Filled 2023-02-15 (×12): qty 1

## 2023-02-15 MED ORDER — PANTOPRAZOLE SODIUM 40 MG PO TBEC
40.0000 mg | DELAYED_RELEASE_TABLET | Freq: Every day | ORAL | Status: DC
  Administered 2023-02-16 – 2023-02-21 (×6): 40 mg via ORAL
  Filled 2023-02-15 (×6): qty 1

## 2023-02-15 MED ORDER — ACETAMINOPHEN 325 MG PO TABS
650.0000 mg | ORAL_TABLET | Freq: Four times a day (QID) | ORAL | Status: DC | PRN
  Administered 2023-02-16: 650 mg via ORAL
  Filled 2023-02-15 (×2): qty 2

## 2023-02-15 MED ORDER — MELATONIN 3 MG PO TABS
3.0000 mg | ORAL_TABLET | Freq: Every evening | ORAL | Status: DC | PRN
  Administered 2023-02-20: 3 mg via ORAL
  Filled 2023-02-15: qty 1

## 2023-02-15 MED ORDER — ALBUTEROL SULFATE (2.5 MG/3ML) 0.083% IN NEBU: 2.5000 mg | INHALATION_SOLUTION | RESPIRATORY_TRACT | Status: DC | PRN

## 2023-02-15 MED ORDER — IOHEXOL 300 MG/ML  SOLN
100.0000 mL | Freq: Once | INTRAMUSCULAR | Status: AC | PRN
  Administered 2023-02-15: 100 mL via INTRAVENOUS

## 2023-02-15 MED ORDER — FENTANYL CITRATE PF 50 MCG/ML IJ SOSY
50.0000 ug | PREFILLED_SYRINGE | Freq: Once | INTRAMUSCULAR | Status: AC
  Administered 2023-02-15: 50 ug via INTRAVENOUS
  Filled 2023-02-15: qty 1

## 2023-02-15 MED ORDER — MEMANTINE HCL 10 MG PO TABS
10.0000 mg | ORAL_TABLET | Freq: Two times a day (BID) | ORAL | Status: DC
  Administered 2023-02-15 – 2023-02-21 (×12): 10 mg via ORAL
  Filled 2023-02-15: qty 1
  Filled 2023-02-15: qty 2
  Filled 2023-02-15 (×10): qty 1

## 2023-02-15 MED ORDER — OXYCODONE-ACETAMINOPHEN 5-325 MG PO TABS
1.0000 | ORAL_TABLET | ORAL | Status: DC | PRN
  Administered 2023-02-16 – 2023-02-20 (×7): 1 via ORAL
  Filled 2023-02-15 (×7): qty 1

## 2023-02-15 MED ORDER — FENTANYL CITRATE PF 50 MCG/ML IJ SOSY: 25.0000 ug | PREFILLED_SYRINGE | INTRAMUSCULAR | Status: DC | PRN

## 2023-02-15 MED ORDER — ACETAMINOPHEN 325 MG PO TABS
650.0000 mg | ORAL_TABLET | Freq: Once | ORAL | Status: AC
  Administered 2023-02-15: 650 mg via ORAL
  Filled 2023-02-15: qty 2

## 2023-02-15 MED ORDER — TORSEMIDE 20 MG PO TABS
10.0000 mg | ORAL_TABLET | Freq: Every day | ORAL | Status: DC
  Administered 2023-02-16 – 2023-02-21 (×5): 10 mg via ORAL
  Filled 2023-02-15 (×6): qty 1

## 2023-02-15 MED ORDER — ACETAMINOPHEN 650 MG RE SUPP: 650.0000 mg | Freq: Four times a day (QID) | RECTAL | Status: DC | PRN

## 2023-02-15 MED ORDER — OXYCODONE HCL 5 MG PO TABS
5.0000 mg | ORAL_TABLET | Freq: Once | ORAL | Status: AC
  Administered 2023-02-15: 5 mg via ORAL
  Filled 2023-02-15: qty 1

## 2023-02-15 MED ORDER — LIDOCAINE 5 % EX PTCH
1.0000 | MEDICATED_PATCH | CUTANEOUS | Status: DC
  Administered 2023-02-15 – 2023-02-20 (×6): 1 via TRANSDERMAL
  Filled 2023-02-15 (×7): qty 1

## 2023-02-15 MED ORDER — NALOXONE HCL 0.4 MG/ML IJ SOLN: 0.4000 mg | INTRAMUSCULAR | Status: DC | PRN

## 2023-02-15 MED ORDER — PRAVASTATIN SODIUM 20 MG PO TABS
40.0000 mg | ORAL_TABLET | Freq: Every day | ORAL | Status: DC
  Administered 2023-02-15 – 2023-02-20 (×6): 40 mg via ORAL
  Filled 2023-02-15 (×6): qty 2

## 2023-02-15 NOTE — Plan of Care (Signed)
°  Problem: Education: Goal: Knowledge of General Education information will improve Description: Including pain rating scale, medication(s)/side effects and non-pharmacologic comfort measures Outcome: Progressing   Problem: Clinical Measurements: Goal: Ability to maintain clinical measurements within normal limits will improve Outcome: Progressing   Problem: Coping: Goal: Level of anxiety will decrease Outcome: Progressing   Problem: Pain Management: Goal: General experience of comfort will improve Outcome: Progressing   Problem: Safety: Goal: Ability to remain free from injury will improve Outcome: Progressing

## 2023-02-15 NOTE — H&P (Addendum)
 History and Physical      [name] FMW:[id number] DOB: [birth year] DOA: 02/15/2023; DOS: 02/15/2023  PCP: Charlott Dorn LABOR, MD  Patient coming from: home   I have personally briefly reviewed patient's old medical records in [facility]  Chief Complaint: fall  HPI: [name] is a 83 y.o. male with medical history significant for dementia, left total hip arthroplasty, essential pretension, hyperlipidemia, chronic diastolic heart failure, CKD 3A with baseline creatinine 1.3-1.7, who is admitted to [facility] on 02/15/2023 with acute left hip pain after presenting from home to [facility] complaining of fall.   Patient with a history of dementia, lives at home with a family member, and ambulates with the aid of a walker at baseline.   He presents for evaluation of ground-level fall incurred earlier today, when stepping out of the shower, he is reported to have tripped, falling to his right side, striking the right portion of his chest on the bathroom floor.  Caregiver heard the thud associated with his fall, and was by the patient's side within a few seconds, confirming that he was conscious at the time that she found him.  The patient does not believe that he hit his head as a component of this fall, and he is not on any blood thinners as an outpatient, including no aspirin .  Denies any preceding, associated, or ensuing dizziness, lightheadedness, or loss of consciousness.  However, he reports new onset right-sided anterior chest wall discomfort, worse with deep inspiration, it started at the time of today's fall in the bathroom.  He denies any associated shortness of breath, nor any left-sided chest discomfort.  Not associate with any palpitations, diaphoresis, nor any recent subjective fever, chills, rigors or generalized myalgias.    Denies any current headache, acute neck pain, but reports acute sharp left hip pain radiating into the left groin, which also started after  today's will the bathroom.  He has a history of left total hip arthroplasty performed in May 2022.  In the setting of associated poor pain control, he reports having difficulty putting weight on the left lower extremity, resulting in difficulty with ambulation relative to his baseline abilities in which he ambulates with the aid of a walker.  No associated any acute focal numbness or paresthesias.  He otherwise denies any significant acute arthralgias or myalgias.  Patient's family member, who serves as his primary caregiver at home, conveys that she is having difficulty providing sufficient assistance with his transfer requirements since the fall earlier today.     ED Course:  Vital signs in the ED were notable for the following: Afebrile; rates in the 50s; systolic blood pressures in the 120s to 150s; respiratory 16, oxygen saturation 94 to 95% on room air.  Labs were notable for the following: BMP notable for the following: Sodium 137, potassium 4.4, creatinine 1.34 compared to most recent prior creatinine data point 1.65 once 09/19/2022, glucose 74, BUN/creatinine ratio 23.9.  CBC notable for Locasol count 8600, hemoglobin 12.4 compared to 12.5 in July 2024.  Per my interpretation, EKG in ED demonstrated the following: No EKG performed in the ED today.  Imaging in the ED, per corresponding formal radiology read, was notable for the following: 2 view chest x-ray showed no evidence of acute cardiopulmonary process, including no evidence of infiltrate, edema, effusion, or pneumothorax.  Plain films of the left hip and pelvis shows status post left total hip arthroplasty with no evidence of acute traumatic injury.  Painfulness of the left femur showed no evidence of acute traumatic injury.  CT head showed no evidence of acute intracranial process, Cleen evidence of intracranial injury evidence of acute infarct.  CT cervical spine showed no evidence of acute cervical spine fracture or subluxation injury.   CT chest, abdomen, pelvis showed acute nondisplaced right 10th rib fracture, but otherwise showed no evidence of acute intrathoracic, acute intra-abdominal, or acute intrapelvic traumatic injury.  This imaging also showed no evidence of acute fracture or traumatic malalignment of the thoracic spine, while demonstrating infrarenal abdominal aortic aneurysm measuring up to 3 cm, with associated radiology recommendation for follow-up ultrasound every 3 years.  CT lumbar spine showed no evidence of acute fracture or traumatic malalignment of the lumbar spine.  While in the ED, the following were administered: Acetaminophen  650 mg p.o. x 1 dose, fentanyl  50 mcg IV x 1 dose, oxycodone  IR 5 mg p.o. x 1 dose.  Subsequently, the patient was admitted for further evaluation management of pain control with the setting of acute left rib fracture as well as acute right 10th rib fracture after presenting with ground-level mechanical fall at home earlier in the day.    Review of Systems: As per HPI otherwise 10 point review of systems negative.   Past Medical History:  Diagnosis Date   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 09/25/2015 09/25/2015   Aspiration pneumonia (HCC) 01/17/2016   Bilateral dry eyes    Bile peritonitis due to gastric tube dislodgement 12/03/2015   BPH (benign prostatic hyperplasia)    Central pterygium of right eye    Chronic respiratory failure with hypoxia (HCC)    Coronary artery disease 1997, 2009   bare mental stent right coronart artery, occlusive followed by Dr. 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 [facility] in [place]   Peptic ulcer    Pneumatosis of intestines 12/11/2015   Pneumonia    HISTORY OF IN CHILDHOOD   Posterior vitreous detachment 10/30/2012   Pyloric stricture 11/24/2015   Salzmann's nodular dystrophy of left eye    SBO (small bowel obstruction) (HCC) 01/20/2016   Urinary hesitancy    Wears glasses     Past Surgical History:  Procedure Laterality Date   CARDIAC CATHETERIZATION     CHOLECYSTECTOMY     ESOPHAGOGASTRODUODENOSCOPY N/A 12/01/2015   Procedure: ESOPHAGOGASTRODUODENOSCOPY (EGD) BALLOON DILATION OF DUODENAL STRICTURE;  Surgeon: Elspeth Schultze, MD;  Location: [facility];  Service: General;  Laterality: N/A;   ESOPHAGOGASTRODUODENOSCOPY N/A 12/14/2015   Procedure: ESOPHAGOGASTRODUODENOSCOPY (EGD);  Surgeon: Oliva Boots, MD;  Location: [facility];  Service: Endoscopy;  Laterality: N/A;  Patient has tracheostomy   ESOPHAGOGASTRODUODENOSCOPY (EGD) WITH PROPOFOL  N/A 07/01/2015   Procedure: ESOPHAGOGASTRODUODENOSCOPY (EGD) WITH PROPOFOL ;  Surgeon: Elsie Cree, MD;  Location: [facility];  Service: Endoscopy;  Laterality: N/A;   ESOPHAGOGASTRODUODENOSCOPY (EGD) WITH PROPOFOL  N/A 11/23/2015   Procedure: ESOPHAGOGASTRODUODENOSCOPY (EGD) WITH PROPOFOL ;  Surgeon: Elsie Cree, MD;  Location: [facility];  Service: Endoscopy;  Laterality: N/A;  may need to intubate   EYE SURGERY     growth on right eye removed    HIP ARTHROPLASTY Left 07/07/2020   Procedure: ARTHROPLASTY BIPOLAR HIP (HEMIARTHROPLASTY);  Surgeon: Josefina Chew, MD;  Location: [facility];  Service: Orthopedics;  Laterality: Left;   IR GENERIC HISTORICAL  12/13/2015   IR REPLC DUODEN/JEJUNO TUBE PERCUT W/FLUORO 12/13/2015 Rome Hall, MD [facility]   IR GENERIC HISTORICAL  02/03/2016   IR REPLC GASTRO/COLONIC TUBE PERCUT W/FLUORO 02/03/2016 Ozell Specking, MD [facility]   IR GENERIC HISTORICAL  02/04/2016   IR  GASTR TUBE CONVERT GASTR-JEJ PER W/FL MOD SED 02/04/2016 Wilkie Lent, MD [facility]   IR GENERIC HISTORICAL  02/25/2016   IR SINUS/FIST TUBE CHK-NON GI 02/25/2016 Marcey Moan, MD [facility]   IR GENERIC HISTORICAL  03/18/2016   IR CM INJ ANY COLONIC TUBE W/FLUORO 03/18/2016 Norleen Roulette, MD [facility]   IR GENERIC HISTORICAL  03/18/2016   IR GJ TUBE CHANGE 03/18/2016 Norleen Roulette, MD [facility]   IR GENERIC HISTORICAL  03/22/2016   IR CM INJ ANY COLONIC TUBE W/FLUORO 03/22/2016 Norleen Roulette, MD [facility]   IR GENERIC HISTORICAL  04/13/2016   IR CM INJ ANY COLONIC TUBE W/FLUORO 04/13/2016 Juliene Balder, MD [facility]   LAPAROSCOPIC GASTROSTOMY N/A 12/01/2015   Procedure: LAPAROSCOPIC PLACEMENT OF FEEDING JEJUNOSTOMY AND GASTROSTOMY TUBE;  Surgeon: Elspeth Schultze, MD;  Location: [facility];  Service: General;  Laterality: N/A;   LAPAROSCOPY N/A 12/03/2015   Procedure: LAPAROSCOPY DIAGNOSTIC, OMENTOPEXY, JEJUNOSTOMY, WASH OUT;  Surgeon: Elspeth Schultze, MD;  Location: [facility];  Service: General;  Laterality: N/A;   LAPAROTOMY N/A 02/06/2016   Procedure: EXPLORATORY LAPAROTOMY, LYSIS OF ADHESIONS, SEGMENTAL SMALL BOWEL RESECTION, GASTROJEJUNOSTOMY;  Surgeon: Deward Null III, MD;  Location: [facility];  Service: General;  Laterality: N/A;   STENT TO HEART     2 1997 AND 1 IN 1996   TONSILLECTOMY     XI ROBOTIC VAGOTOMY AND ANTRECTOMY N/A 09/25/2015   Procedure: XI ROBOTIC ANTERIOR AND POSTERIOR VAGOTOMY, BILROTH I  ANASTOMOSIS DOR FUNDIPLICATION, OMENTOPEXY  UPPER ENDOSCOPY;  Surgeon: Elspeth Schultze, MD;  Location: [facility];  Service: General;  Laterality: N/A;    Social History:  reports that he quit smoking about 28 years ago. His smoking use included cigarettes. He started smoking about 48 years ago. He has a 40 pack-year smoking history. He has never used smokeless tobacco. He reports that he does not drink alcohol  and does not use drugs.   Allergies  Allergen Reactions   Alfuzosin Other (See Comments)     Leg weakness    Lisinopril Cough   Lorazepam  Other (See Comments)    i don't like it  Prefers Xanax  or valium    Tamsulosin Hcl Other (See Comments)    Leg weakness   Uroxatral [Alfuzosin Hcl Er] Other (See Comments)    Leg weakness    Family History  Problem Relation Age of Onset   Heart attack Mother     Family history reviewed and not pertinent    Prior to Admission medications   Medication Sig Start Date End Date Taking? Authorizing Provider  bismuth  subsalicylate (PEPTO BISMOL) 262 MG/15ML suspension Take 30 mLs by mouth every 6 (six) hours as needed for indigestion.    [provider]  cholestyramine ORMA) 4 GM/DOSE powder Take 4 g by mouth See admin instructions. Mix 4 grams of powder into a glass of orange juice and drink every morning 03/12/20   [provider]  divalproex  (DEPAKOTE ) 125 MG DR tablet TAKE TWO TABLETS BY MOUTH TWICE DAILY 05/12/22   Gayland Lauraine PARAS, NP  loperamide  (IMODIUM ) 2 MG capsule Take 2 mg by mouth daily.    [provider]  memantine  (NAMENDA ) 10 MG tablet TAKE ONE TABLET BY MOUTH TWICE DAILY 05/12/22   Gayland Lauraine PARAS, NP  Multiple Vitamin (MULTI-VITAMINS) TABS Take 1 tablet by mouth daily with breakfast.    [provider]  pantoprazole  (PROTONIX ) 40 MG tablet Take 40 mg by mouth daily before breakfast. 10/19/17   [provider]  pravastatin  (PRAVACHOL ) 40 MG tablet Take 40 mg by mouth at bedtime. 09/06/17   [provider]  spironolactone  (ALDACTONE ) 25 MG tablet TAKE 1/2 TABLET BY MOUTH ONCE DAILY 10/18/22   Jeffrie Oneil BROCKS, MD  torsemide  (DEMADEX ) 20 MG tablet Take 1 tablet (20 mg total) by mouth daily. Take 1 tablet daily for 1 week then decrease to 1/2 tab daily 09/20/22   Jeffrie Oneil BROCKS, MD     Objective    Physical Exam: Vitals:   02/15/23 1800 02/15/23 1900 02/15/23 2000 02/15/23 2103  BP: (!) 150/58 (!) 148/60 127/61   Pulse: (!) 48 (!) 55 (!) 53   Resp: 16 16 16    Temp:    98.6 F  (37 C)  TempSrc:    Oral  SpO2: 92% 94% 94%   Weight:      Height:        General: appears to be stated age; alert, confused Skin: warm, dry, no rash Head:  AT/[place] Mouth:  Oral mucosa membranes appear dry, normal dentition Neck: supple; trachea midline Heart:  RRR; did not appreciate any M/R/G Lungs: CTAB, did not appreciate any wheezes, rales, or rhonchi Abdomen: + BS; soft, ND, NT Vascular: 2+ pedal pulses b/l; 2+ radial pulses b/l Extremities: no peripheral edema, no muscle wasting Neuro: sensation intact in upper and lower extremities b/l    Labs on Admission: I have personally reviewed following labs and imaging studies  CBC: Recent Labs  Lab 02/15/23 1723  WBC 8.6  NEUTROABS 6.2  HGB 12.4*  HCT 39.9  MCV 105.8*  PLT 176   Basic Metabolic Panel: Recent Labs  Lab 02/15/23 1723  NA 137  K 4.4  CL 107  CO2 25  GLUCOSE 74  BUN 32*  CREATININE 1.34*  CALCIUM  8.5*   GFR: Estimated Creatinine Clearance: 43.3 mL/min (A) (by C-G formula based on SCr of 1.34 mg/dL (H)). Liver Function Tests: No results for input(s): AST, ALT, ALKPHOS, BILITOT, PROT, ALBUMIN  in the last 168 hours. No results for input(s): LIPASE, AMYLASE in the last 168 hours. No results for input(s): AMMONIA in the last 168 hours. Coagulation Profile: No results for input(s): INR, PROTIME in the last 168 hours. Cardiac Enzymes: No results for input(s): CKTOTAL, CKMB, CKMBINDEX, TROPONINI in the last 168 hours. BNP (last 3 results) Recent Labs    08/26/22 1235  PROBNP 621*   HbA1C: No results for input(s): HGBA1C in the last 72 hours. CBG: No results for input(s): GLUCAP in the last 168 hours. Lipid Profile: No results for input(s): CHOL, HDL, LDLCALC, TRIG, CHOLHDL, LDLDIRECT in the last 72 hours. Thyroid  Function Tests: No results for input(s): TSH, T4TOTAL, FREET4, T3FREE, THYROIDAB in the last 72 hours. Anemia Panel: No  results for input(s): VITAMINB12, FOLATE, FERRITIN, TIBC, IRON , RETICCTPCT in the last 72 hours. Urine analysis:    Component Value Date/Time   COLORURINE YELLOW 02/28/2016 1036  APPEARANCEUR CLEAR 02/28/2016 1036   LABSPEC 1.018 02/28/2016 1036   PHURINE 5.0 02/28/2016 1036   GLUCOSEU NEGATIVE 02/28/2016 1036   HGBUR NEGATIVE 02/28/2016 1036   BILIRUBINUR NEGATIVE 02/28/2016 1036   KETONESUR NEGATIVE 02/28/2016 1036   PROTEINUR NEGATIVE 02/28/2016 1036   NITRITE NEGATIVE 02/28/2016 1036   LEUKOCYTESUR NEGATIVE 02/28/2016 1036    Radiological Exams on Admission: CT CHEST ABDOMEN PELVIS W CONTRAST Result Date: 02/15/2023 CLINICAL DATA:  Polytrauma, blunt EXAM: CT CHEST, ABDOMEN, AND PELVIS WITH CONTRAST TECHNIQUE: Multidetector CT imaging of the chest, abdomen and pelvis was performed following the standard protocol during bolus administration of intravenous contrast. RADIATION DOSE REDUCTION: This exam was performed according to the departmental dose-optimization program which includes automated exposure control, adjustment of the mA and/or kV according to patient size and/or use of iterative reconstruction technique. CONTRAST:  OMNIPAQUE  IOHEXOL  300 MG/ML  SOLN COMPARISON:  CT abdomen pelvis 02/19/2016 FINDINGS: CHEST: Cardiovascular: No aortic injury. The thoracic aorta is normal in caliber. The heart is normal in size. No significant pericardial effusion. Moderate atherosclerotic plaque. Four-vessel coronary artery calcification. Mediastinum/Nodes: No pneumomediastinum. No mediastinal hematoma. The esophagus is unremarkable. The thyroid  is unremarkable. The central airways are patent. No mediastinal, hilar, or axillary lymphadenopathy. Lungs/Pleura: Centrilobular emphysematous changes. No focal consolidation. No pulmonary nodule. No pulmonary mass. No pulmonary contusion or laceration. No pneumatocele formation. No pleural effusion. No pneumothorax. No hemothorax.  Musculoskeletal/Chest wall: No chest wall mass. Acute nondisplaced right tenth rib fracture (3: 55). No spinal fracture. ABDOMEN / PELVIS: Hepatobiliary: Not enlarged. No focal lesion. No laceration or subcapsular hematoma. Status post cholecystectomy.  No biliary ductal dilatation. Pancreas: Normal pancreatic contour. No main pancreatic duct dilatation. Spleen: Not enlarged. No focal lesion. No laceration, subcapsular hematoma, or vascular injury. Adrenals/Urinary Tract: No nodularity bilaterally. Bilateral kidneys enhance symmetrically. Bilateral nephrolithiasis, measuring up to 4 mm on the right. No hydronephrosis. No contusion, laceration, or subcapsular hematoma. No injury to the vascular structures or collecting systems. No hydroureter. The urinary bladder is unremarkable. On delayed imaging, there is no urothelial wall thickening and there are no filling defects in the opacified portions of the bilateral collecting systems or ureters. Stomach/Bowel: No small or large bowel wall thickening or dilatation. Status post appendectomy. Vasculature/Lymphatics: Aneurysmal infrarenal abdominal aorta measuring up to 3 cm. Severe atherosclerotic plaque. No iliac aneurysm. No active contrast extravasation or pseudoaneurysm. No abdominal, pelvic, inguinal lymphadenopathy. Reproductive: Normal. Other: No simple free fluid ascites. No pneumoperitoneum. No hemoperitoneum. No mesenteric hematoma identified. No organized fluid collection. Musculoskeletal: No significant soft tissue hematoma. Diffusely decreased bone density. No acute pelvic fracture. Please see separately dictated CT lumbar spine 02/15/2023. Total left hip arthroplasty partially visualized with no CT evidence of surgical hardware complication. Old healed left hip fracture (3:130). Ports and Devices: None. IMPRESSION: 1. Acute nondisplaced right tenth rib fracture. 2. No acute intrathoracic, intra-abdominal, intrapelvic traumatic injury. 3. No acute fracture or  traumatic malalignment of the thoracic spine. 4. Please see separately dictated CT lumbar spine 02/15/2023. Other imaging findings of potential clinical significance: 1. Atherosclerotic plaque of the abdominal aorta. Aortic Atherosclerosis (ICD10-I70.0). 2. Aneurysmal infrarenal abdominal aorta measuring up to 3 cm. Recommend follow-up ultrasound every 3 years. (Ref.: J Vasc Surg. 2018; 67:2-77 and J Am Coll Radiol 2013;10(10):789-794.) Aortic aneurysm NOS (ICD10-I71.9). 3.  Emphysema (ICD10-J43.9). Electronically Signed   By: Morgane  Naveau M.D.   On: 02/15/2023 20:06   CT L-SPINE NO CHARGE Result Date: 02/15/2023 CLINICAL DATA:  144615 Pain 144615 bathroom, foot was  tangled in rug resulting in fall, no thinners. Pain in left hip area when trying to weight bear. Pt has dementia. No LOC and did not hit head. Lives with daughter. 130/59-76-95% RA EXAM: CT LUMBAR SPINE WITHOUT CONTRAST TECHNIQUE: Multidetector CT imaging of the lumbar spine was performed without intravenous contrast administration. Multiplanar CT image reconstructions were also generated. RADIATION DOSE REDUCTION: This exam was performed according to the departmental dose-optimization program which includes automated exposure control, adjustment of the mA and/or kV according to patient size and/or use of iterative reconstruction technique. COMPARISON:  CT abdomen pelvis 02/19/2016 FINDINGS: Segmentation: 5 lumbar type vertebrae. Alignment: Normal. Vertebrae: Diffusely decreased bone density. Multilevel mild degenerative changes of the spine. Moderate facet arthropathy at the L5-S1 level. No acute fracture or focal pathologic process. Paraspinal and other soft tissues: Negative. Disc levels: Mild intervertebral disc space narrowing at the L2-L3 level. Other: Atherosclerotic plaque of the abdominal aorta. Aneurysmal infrarenal abdominal aorta measuring up to 3 cm. Please see separately dictated CT abdomen pelvis 02/15/2023. IMPRESSION: 1. No acute  displaced fracture or traumatic listhesis of the lumbar spine. 2. Diffusely decreased bone density. 3. Atherosclerotic plaque of the abdominal aorta. Aortic Atherosclerosis (ICD10-I70.0). 4. Aneurysmal infrarenal abdominal aorta measuring up to 3 cm. Recommend follow-up ultrasound every 3 years. (Ref.: J Vasc Surg. 2018; 67:2-77 and J Am Coll Radiol 2013;10(10):789-794.) Aortic aneurysm NOS (ICD10-I71.9). Electronically Signed   By: Morgane  Naveau M.D.   On: 02/15/2023 19:54   CT HEAD WO CONTRAST ( ) Result Date: 02/15/2023 CLINICAL DATA:  Polytrauma, blunt Trauma fall in bathroom, foot was tangled in rug resulting in fall, no thinners. Pain in left hip area when trying to weight bear. Pt has dementia. No LOC and did not hit head. Lives with daughter. 130/59-76-95% RA EXAM: CT HEAD WITHOUT CONTRAST CT CERVICAL SPINE WITHOUT CONTRAST TECHNIQUE: Multidetector CT imaging of the head and cervical spine was performed following the standard protocol without intravenous contrast. Multiplanar CT image reconstructions of the cervical spine were also generated. RADIATION DOSE REDUCTION: This exam was performed according to the departmental dose-optimization program which includes automated exposure control, adjustment of the mA and/or kV according to patient size and/or use of iterative reconstruction technique. COMPARISON:  CT chest 02/15/2023, CT head C-spine 07/06/2020 FINDINGS: CT HEAD FINDINGS Brain: Cerebral ventricle sizes are concordant with the degree of cerebral volume loss. Patchy and confluent areas of decreased attenuation are noted throughout the deep and periventricular white matter of the cerebral hemispheres bilaterally, compatible with chronic microvascular ischemic disease. No evidence of large-territorial acute infarction. No parenchymal hemorrhage. No mass lesion. No extra-axial collection. No mass effect or midline shift. No hydrocephalus. Basilar cisterns are patent. Vascular: No hyperdense vessel.  Skull: No acute fracture or focal lesion. Sinuses/Orbits: Paranasal sinuses and mastoid air cells are clear. The orbits are unremarkable. Other: None. CT CERVICAL SPINE FINDINGS Alignment: Normal. Skull base and vertebrae: Multilevel mild moderate degenerative changes spine. No severe osseous neural foraminal or central canal stenosis. No acute fracture. No aggressive appearing focal osseous lesion or focal pathologic process. Soft tissues and spinal canal: No prevertebral fluid or swelling. No visible canal hematoma. Upper chest: Centrilobular emphysematous changes. Other: Atherosclerotic plaque of the carotid arteries within the neck. IMPRESSION: 1. No acute intracranial abnormality. 2. No acute displaced fracture or traumatic listhesis of the cervical spine. 3.  Emphysema (ICD10-J43.9). Electronically Signed   By: Morgane  Naveau M.D.   On: 02/15/2023 19:49   CT Cervical Spine Wo Contrast Result Date: 02/15/2023 CLINICAL DATA:  Polytrauma, blunt Trauma fall in bathroom, foot was tangled in rug resulting in fall, no thinners. Pain in left hip area when trying to weight bear. Pt has dementia. No LOC and did not hit head. Lives with daughter. 130/59-76-95% RA EXAM: CT HEAD WITHOUT CONTRAST CT CERVICAL SPINE WITHOUT CONTRAST TECHNIQUE: Multidetector CT imaging of the head and cervical spine was performed following the standard protocol without intravenous contrast. Multiplanar CT image reconstructions of the cervical spine were also generated. RADIATION DOSE REDUCTION: This exam was performed according to the departmental dose-optimization program which includes automated exposure control, adjustment of the mA and/or kV according to patient size and/or use of iterative reconstruction technique. COMPARISON:  CT chest 02/15/2023, CT head C-spine 07/06/2020 FINDINGS: CT HEAD FINDINGS Brain: Cerebral ventricle sizes are concordant with the degree of cerebral volume loss. Patchy and confluent areas of decreased  attenuation are noted throughout the deep and periventricular white matter of the cerebral hemispheres bilaterally, compatible with chronic microvascular ischemic disease. No evidence of large-territorial acute infarction. No parenchymal hemorrhage. No mass lesion. No extra-axial collection. No mass effect or midline shift. No hydrocephalus. Basilar cisterns are patent. Vascular: No hyperdense vessel. Skull: No acute fracture or focal lesion. Sinuses/Orbits: Paranasal sinuses and mastoid air cells are clear. The orbits are unremarkable. Other: None. CT CERVICAL SPINE FINDINGS Alignment: Normal. Skull base and vertebrae: Multilevel mild moderate degenerative changes spine. No severe osseous neural foraminal or central canal stenosis. No acute fracture. No aggressive appearing focal osseous lesion or focal pathologic process. Soft tissues and spinal canal: No prevertebral fluid or swelling. No visible canal hematoma. Upper chest: Centrilobular emphysematous changes. Other: Atherosclerotic plaque of the carotid arteries within the neck. IMPRESSION: 1. No acute intracranial abnormality. 2. No acute displaced fracture or traumatic listhesis of the cervical spine. 3.  Emphysema (ICD10-J43.9). Electronically Signed   By: Morgane  Naveau M.D.   On: 02/15/2023 19:49   DG Femur Portable Min 2 Views Left Result Date: 02/15/2023 CLINICAL DATA:  Fall; Leg Injury EXAM: LEFT FEMUR PORTABLE 2 VIEWS COMPARISON:  X-ray left hip FINDINGS: Total left hip arthroplasty. At least mild to moderate tibiofemoral compartment joint degenerative changes. There is no evidence of fracture or other focal bone lesions. Soft tissues are unremarkable. Vascular calcifications. IMPRESSION: Negative for acute traumatic injury. Electronically Signed   By: Morgane  Naveau M.D.   On: 02/15/2023 19:17   DG Chest 2 View Result Date: 02/15/2023 CLINICAL DATA:  fall EXAM: CHEST - 2 VIEW COMPARISON:  Chest x-ray 09/14/2021, CT chest 09/15/2021 FINDINGS:  Patient is slightly rotated making the hilar vascular slightly more prominent than the right. The heart and mediastinal contours are unchanged. Atherosclerotic plaque. No focal consolidation. Chronic coarsened markings with no overt pulmonary edema. No pleural effusion. No pneumothorax. No acute osseous abnormality. IMPRESSION: 1. No active cardiopulmonary disease. 2.  Aortic Atherosclerosis (ICD10-I70.0). Electronically Signed   By: Morgane  Naveau M.D.   On: 02/15/2023 19:16   DG Hip Unilat With Pelvis 2-3 Views Left Result Date: 02/15/2023 CLINICAL DATA:  PAIN, fall EXAM: DG HIP (WITH OR WITHOUT PELVIS) 2-3V LEFT COMPARISON:  None Available. FINDINGS: Total left hip arthroplasty. No radiograph findings to suggest surgical hardware complication. No acute displaced fracture or dislocation of the right hip on frontal view. No acute displaced fracture or diastasis of the bones of the pelvis. There is no evidence of severe arthropathy or other focal bone abnormality. IMPRESSION: 1. Negative for acute traumatic injury. 2. Total left hip arthroplasty. Electronically Signed   By:  Morgane  Naveau M.D.   On: 02/15/2023 19:14      Assessment/Plan    Principal Problem:   Acute hip pain, left Active Problems:   HLD (hyperlipidemia)   GERD (gastroesophageal reflux disease)   CKD stage 3a, GFR 45-59 ml/min (HCC)   Right rib fracture   Fall at home, initial encounter   Infrarenal abdominal aortic aneurysm (AAA) without rupture (HCC)   Chronic diastolic CHF (congestive heart failure) (HCC)    #) Acute left hip pain: The patient presents with acute left hip discomfort stemming from his mechanical ground-level fall in the bathroom earlier today.  This is in the context of a history of left total hip arthroplasty in May 2022, with plain films performed today showing no evidence of acute fracture dislocation or malalignment of existing prosthesis.  However, in the setting of this acute left hip discomfort, the  patient conveys that he is having difficulty putting weight on the left lower extremity as a consequence of current poor pain control, limiting his ambulatory abilities relative to baseline which she is able to ambulate with the aid of a walker at home.  Consequently, we will admit the patient for optimization of pain control and to pursue PT/OT consults.  Left lower extremity appears neurovascularly intact at this time.  Plan: Percocet 5/management 5 mg p.o. every 4 hours as needed.  Fentanyl  25 mg IV every 2 hours as needed for breakthrough pain.  Lidocaine  patch to the left hip.  PT/OT consults physical morning.  Fall precautions ordered.  Incentive spirometry.                #) Acute nondisplaced right 10th rib fracture: Following ground-level mechanical fall experienced earlier today presenting imaging shows evidence of nondisplaced fractures of the right 10th rib in the absence of any additional evidence of acute cardiopulmonary process, including no evidence of pneumothorax.  No other rib fractures noted for this imaging. Will strive to ensure establishment of adequate pain control so as to promote sufficient inspiratory effort in order to decrease subsequent risk for development of pneumonia should insufficient pain control not be achieved.  At this time, the patient does not appear to be in any respiratory distress, and is maintaining oxygen saturations in the mid 90s on room air. Will order prn Percocet for the anti-inflammatory benefits a/w inclusion of acetaminophen .     Plan: prn Percocet, as further described above.  As needed IV fentanyl  for breakthrough pain.   Incentive spirometry to evaluate for and promote adequate inspiratory effort in part as a means of assessing adequacy of current level of pain control.  Check UA as part of evaluation for any underlying reversible factors contributing to precipitating ground-level mechanical fall.                      #)  Ground-level mechanical fall: Ground-level mechanical fall while ambulating out of the shower at home earlier today in the absence of any associated loss of consciousness or apparent head trauma.  Appears to have resulted in presenting acute right nondisplaced rib fracture, as above, as well as the patient's report of acute left hip discomfort without evidence of associated fracture.    Plan: Work-up and management of presenting acute left hip pain as well as acute nondisplaced right-sided 10th rib fracture, as described above.  Fall precautions ordered.  PT/OT consults placed for the morning.  Check urinalysis.                #)  Infrarenal abdominal aortic aneurysm: Noted on today's CT abdomen/pelvis, measuring up to 3 cm, without any evidence of associated dissection or rupture, corresponding radiology recommendation for follow-up ultrasound every 3 years.  Plan: Will convey radiology's recommendation for follow-up ultrasound every 3 years.                   #) Hyperlipidemia: documented h/o such. On pravastatin  as outpatient.   Plan: continue home statin.                   #) GERD: documented h/o such; on Protonix  as outpatient.   Plan: continue home PPI.                    #) Chronic diastolic heart failure: documented history of such, with most recent echocardiogram performed in August 2024, which was notable for LVEF 55 to 60%, no evidence of focal Measurin  values, grade 1 diastolic dysfunction, low normal right ventricular systolic function and trivial aortic regurgitation. No clinical or radiographic evidence to suggest acutely decompensated heart failure at this time. home diuretic regimen reportedly consists of the following: Torsemide  10 mg p.o. daily as well as spironolactone .  Given presenting labs showing evidence of mild acute prerenal azotemia, will resume torsemide , will hold spironolactone  for now, will closely  monitoring ensuing volume status as outlined below.  Plan: monitor strict I's & O's and daily weights. Repeat CMP in AM. Check serum mag level. Continue home torsemide .  Holding spironolactone  for now, as above.                 #) CKD Stage 3A: Documented history of such, with baseline creatinine 1.3-1.7, with presenting creatinine consistent with this baseline.   Plan: Monitor strict I's and O's and daily weights.  Attempt to avoid nephrotoxic agents.  CMP/magnesium  level in the AM.      DVT prophylaxis: SCD's   Code Status: DNR, consistent with active DNR form on file, active MOST form, as well as code status documentation from each of the 2 most recent hospitalizations Family Communication: none Disposition Plan: Per Rounding Team Consults called: none;  Admission status: Observation    I SPENT GREATER THAN 75  MINUTES IN CLINICAL CARE TIME/MEDICAL DECISION-MAKING IN COMPLETING THIS ADMISSION.     Camellia Popescu B Briahnna Harries DO [facility] From 7PM - 7AM   02/15/2023, 9:21 PM

## 2023-02-15 NOTE — ED Triage Notes (Signed)
 BIB EMS after fall in bathroom, foot was tangled in rug resulting in fall, no thinners. Pain in left hip area when trying to weight bear. Pt has dementia. No LOC and did not hit head. Lives with daughter. 130/59-76-95% RA

## 2023-02-15 NOTE — ED Provider Notes (Signed)
 [place] [facility]MENT AT [facility] Provider Note   CSN: [id number] Arrival date & time: 02/15/23  1621     History  Chief Complaint  Patient presents with   Fall   Leg Injury    [name] is a 83 y.o. male.  Patient here with left hip/thigh pain after mechanical fall.  Patient got out of the shower and got his foot stuck in the rug.  He fell back landed more on his left side.  Did not hit his head.  Did not lose consciousness.  Is not on blood thinners.  Patient does have dementia but this was a witnessed fall.  Patient has no pain except for in his left hip left thigh where he has had a hip replacement in the past.  He ambulates at baseline with a walker but he is unable to put any weight on his leg without pain.  But he is not having much pain at rest.  There is no other injury to his arms head or neck he states.  The history is provided by the patient.       Home Medications Prior to Admission medications   Medication Sig Start Date End Date Taking? Authorizing Provider  bismuth  subsalicylate (PEPTO BISMOL) 262 MG/15ML suspension Take 30 mLs by mouth every 6 (six) hours as needed for indigestion.    [provider]  cholestyramine ORMA) 4 GM/DOSE powder Take 4 g by mouth See admin instructions. Mix 4 grams of powder into a glass of orange juice and drink every morning 03/12/20   [provider]  divalproex  (DEPAKOTE ) 125 MG DR tablet TAKE TWO TABLETS BY MOUTH TWICE DAILY 05/12/22   Gayland Lauraine PARAS, NP  loperamide  (IMODIUM ) 2 MG capsule Take 2 mg by mouth daily.    [provider]  memantine  (NAMENDA ) 10 MG tablet TAKE ONE TABLET BY MOUTH TWICE DAILY 05/12/22   Gayland Lauraine PARAS, NP  Multiple Vitamin (MULTI-VITAMINS) TABS Take 1 tablet by mouth daily with breakfast.    [provider]  pantoprazole  (PROTONIX ) 40 MG tablet Take 40 mg by mouth daily before breakfast. 10/19/17   [provider]  pravastatin   (PRAVACHOL ) 40 MG tablet Take 40 mg by mouth at bedtime. 09/06/17   [provider]  spironolactone  (ALDACTONE ) 25 MG tablet TAKE 1/2 TABLET BY MOUTH ONCE DAILY 10/18/22   Jeffrie Oneil BROCKS, MD  torsemide  (DEMADEX ) 20 MG tablet Take 1 tablet (20 mg total) by mouth daily. Take 1 tablet daily for 1 week then decrease to 1/2 tab daily 09/20/22   Jeffrie Oneil BROCKS, MD      Allergies    Alfuzosin, Lisinopril, Lorazepam , Tamsulosin hcl, and Uroxatral [alfuzosin hcl er]    Review of Systems   Review of Systems  Physical Exam Updated Vital Signs BP 127/61   Pulse (!) 53   Temp 98.6 F (37 C) (Oral)   Resp 16   Ht 6' (1.829 m)   Wt 73.3 kg   SpO2 94%   BMI 21.92 kg/m  Physical Exam Vitals and nursing note reviewed.  Constitutional:      General: He is not in acute distress.    Appearance: He is well-developed. He is not ill-appearing.  HENT:     Head: Normocephalic and atraumatic.     Nose: Nose normal.     Mouth/Throat:     Mouth: Mucous membranes are moist.  Eyes:     Extraocular Movements: Extraocular movements intact.  Conjunctiva/sclera: Conjunctivae normal.     Pupils: Pupils are equal, round, and reactive to light.  Cardiovascular:     Rate and Rhythm: Normal rate and regular rhythm.     Pulses: Normal pulses.     Heart sounds: Normal heart sounds. No murmur heard. Pulmonary:     Effort: Pulmonary effort is normal. No respiratory distress.     Breath sounds: Normal breath sounds.  Abdominal:     Palpations: Abdomen is soft.     Tenderness: There is abdominal tenderness.  Musculoskeletal:        General: Tenderness present. No swelling.     Cervical back: Normal range of motion and neck supple.     Comments: Tenderness to the left hip left thigh, too much pain to test range of motion, tenderness to the posterior right sided ribs  Skin:    General: Skin is warm and dry.     Capillary Refill: Capillary refill takes less than 2 seconds.     Comments: Abrasions to the  right flank  Neurological:     General: No focal deficit present.     Mental Status: He is alert and oriented to person, place, and time.     Cranial Nerves: No cranial nerve deficit.     Sensory: No sensory deficit.     Motor: No weakness.     Coordination: Coordination normal.  Psychiatric:        Mood and Affect: Mood normal.     ED Results / Procedures / Treatments   Labs (all labs ordered are listed, but only abnormal results are displayed) Labs Reviewed  CBC WITH DIFFERENTIAL/PLATELET - Abnormal; Notable for the following components:      Result Value   RBC 3.77 (*)    Hemoglobin 12.4 (*)    MCV 105.8 (*)    All other components within normal limits  BASIC METABOLIC PANEL - Abnormal; Notable for the following components:   BUN 32 (*)    Creatinine, Ser 1.34 (*)    Calcium  8.5 (*)    GFR, Estimated 53 (*)    All other components within normal limits  MAGNESIUM   CBC WITH DIFFERENTIAL/PLATELET  COMPREHENSIVE METABOLIC PANEL  MAGNESIUM     EKG None  Radiology CT CHEST ABDOMEN PELVIS W CONTRAST Result Date: 02/15/2023 CLINICAL DATA:  Polytrauma, blunt EXAM: CT CHEST, ABDOMEN, AND PELVIS WITH CONTRAST TECHNIQUE: Multidetector CT imaging of the chest, abdomen and pelvis was performed following the standard protocol during bolus administration of intravenous contrast. RADIATION DOSE REDUCTION: This exam was performed according to the departmental dose-optimization program which includes automated exposure control, adjustment of the mA and/or kV according to patient size and/or use of iterative reconstruction technique. CONTRAST:  OMNIPAQUE  IOHEXOL  300 MG/ML  SOLN COMPARISON:  CT abdomen pelvis 02/19/2016 FINDINGS: CHEST: Cardiovascular: No aortic injury. The thoracic aorta is normal in caliber. The heart is normal in size. No significant pericardial effusion. Moderate atherosclerotic plaque. Four-vessel coronary artery calcification. Mediastinum/Nodes: No pneumomediastinum.  No mediastinal hematoma. The esophagus is unremarkable. The thyroid  is unremarkable. The central airways are patent. No mediastinal, hilar, or axillary lymphadenopathy. Lungs/Pleura: Centrilobular emphysematous changes. No focal consolidation. No pulmonary nodule. No pulmonary mass. No pulmonary contusion or laceration. No pneumatocele formation. No pleural effusion. No pneumothorax. No hemothorax. Musculoskeletal/Chest wall: No chest wall mass. Acute nondisplaced right tenth rib fracture (3: 55). No spinal fracture. ABDOMEN / PELVIS: Hepatobiliary: Not enlarged. No focal lesion. No laceration or subcapsular hematoma. Status post cholecystectomy.  No biliary  ductal dilatation. Pancreas: Normal pancreatic contour. No main pancreatic duct dilatation. Spleen: Not enlarged. No focal lesion. No laceration, subcapsular hematoma, or vascular injury. Adrenals/Urinary Tract: No nodularity bilaterally. Bilateral kidneys enhance symmetrically. Bilateral nephrolithiasis, measuring up to 4 mm on the right. No hydronephrosis. No contusion, laceration, or subcapsular hematoma. No injury to the vascular structures or collecting systems. No hydroureter. The urinary bladder is unremarkable. On delayed imaging, there is no urothelial wall thickening and there are no filling defects in the opacified portions of the bilateral collecting systems or ureters. Stomach/Bowel: No small or large bowel wall thickening or dilatation. Status post appendectomy. Vasculature/Lymphatics: Aneurysmal infrarenal abdominal aorta measuring up to 3 cm. Severe atherosclerotic plaque. No iliac aneurysm. No active contrast extravasation or pseudoaneurysm. No abdominal, pelvic, inguinal lymphadenopathy. Reproductive: Normal. Other: No simple free fluid ascites. No pneumoperitoneum. No hemoperitoneum. No mesenteric hematoma identified. No organized fluid collection. Musculoskeletal: No significant soft tissue hematoma. Diffusely decreased bone density. No acute  pelvic fracture. Please see separately dictated CT lumbar spine 02/15/2023. Total left hip arthroplasty partially visualized with no CT evidence of surgical hardware complication. Old healed left hip fracture (3:130). Ports and Devices: None. IMPRESSION: 1. Acute nondisplaced right tenth rib fracture. 2. No acute intrathoracic, intra-abdominal, intrapelvic traumatic injury. 3. No acute fracture or traumatic malalignment of the thoracic spine. 4. Please see separately dictated CT lumbar spine 02/15/2023. Other imaging findings of potential clinical significance: 1. Atherosclerotic plaque of the abdominal aorta. Aortic Atherosclerosis (ICD10-I70.0). 2. Aneurysmal infrarenal abdominal aorta measuring up to 3 cm. Recommend follow-up ultrasound every 3 years. (Ref.: J Vasc Surg. 2018; 67:2-77 and J Am Coll Radiol 2013;10(10):789-794.) Aortic aneurysm NOS (ICD10-I71.9). 3.  Emphysema (ICD10-J43.9). Electronically Signed   By: Morgane  Naveau M.D.   On: 02/15/2023 20:06   CT L-SPINE NO CHARGE Result Date: 02/15/2023 CLINICAL DATA:  144615 Pain 144615 bathroom, foot was tangled in rug resulting in fall, no thinners. Pain in left hip area when trying to weight bear. Pt has dementia. No LOC and did not hit head. Lives with daughter. 130/59-76-95% RA EXAM: CT LUMBAR SPINE WITHOUT CONTRAST TECHNIQUE: Multidetector CT imaging of the lumbar spine was performed without intravenous contrast administration. Multiplanar CT image reconstructions were also generated. RADIATION DOSE REDUCTION: This exam was performed according to the departmental dose-optimization program which includes automated exposure control, adjustment of the mA and/or kV according to patient size and/or use of iterative reconstruction technique. COMPARISON:  CT abdomen pelvis 02/19/2016 FINDINGS: Segmentation: 5 lumbar type vertebrae. Alignment: Normal. Vertebrae: Diffusely decreased bone density. Multilevel mild degenerative changes of the spine. Moderate  facet arthropathy at the L5-S1 level. No acute fracture or focal pathologic process. Paraspinal and other soft tissues: Negative. Disc levels: Mild intervertebral disc space narrowing at the L2-L3 level. Other: Atherosclerotic plaque of the abdominal aorta. Aneurysmal infrarenal abdominal aorta measuring up to 3 cm. Please see separately dictated CT abdomen pelvis 02/15/2023. IMPRESSION: 1. No acute displaced fracture or traumatic listhesis of the lumbar spine. 2. Diffusely decreased bone density. 3. Atherosclerotic plaque of the abdominal aorta. Aortic Atherosclerosis (ICD10-I70.0). 4. Aneurysmal infrarenal abdominal aorta measuring up to 3 cm. Recommend follow-up ultrasound every 3 years. (Ref.: J Vasc Surg. 2018; 67:2-77 and J Am Coll Radiol 2013;10(10):789-794.) Aortic aneurysm NOS (ICD10-I71.9). Electronically Signed   By: Morgane  Naveau M.D.   On: 02/15/2023 19:54   CT HEAD WO CONTRAST ( ) Result Date: 02/15/2023 CLINICAL DATA:  Polytrauma, blunt Trauma fall in bathroom, foot was tangled in rug resulting in fall, no thinners.  Pain in left hip area when trying to weight bear. Pt has dementia. No LOC and did not hit head. Lives with daughter. 130/59-76-95% RA EXAM: CT HEAD WITHOUT CONTRAST CT CERVICAL SPINE WITHOUT CONTRAST TECHNIQUE: Multidetector CT imaging of the head and cervical spine was performed following the standard protocol without intravenous contrast. Multiplanar CT image reconstructions of the cervical spine were also generated. RADIATION DOSE REDUCTION: This exam was performed according to the departmental dose-optimization program which includes automated exposure control, adjustment of the mA and/or kV according to patient size and/or use of iterative reconstruction technique. COMPARISON:  CT chest 02/15/2023, CT head C-spine 07/06/2020 FINDINGS: CT HEAD FINDINGS Brain: Cerebral ventricle sizes are concordant with the degree of cerebral volume loss. Patchy and confluent areas of decreased  attenuation are noted throughout the deep and periventricular white matter of the cerebral hemispheres bilaterally, compatible with chronic microvascular ischemic disease. No evidence of large-territorial acute infarction. No parenchymal hemorrhage. No mass lesion. No extra-axial collection. No mass effect or midline shift. No hydrocephalus. Basilar cisterns are patent. Vascular: No hyperdense vessel. Skull: No acute fracture or focal lesion. Sinuses/Orbits: Paranasal sinuses and mastoid air cells are clear. The orbits are unremarkable. Other: None. CT CERVICAL SPINE FINDINGS Alignment: Normal. Skull base and vertebrae: Multilevel mild moderate degenerative changes spine. No severe osseous neural foraminal or central canal stenosis. No acute fracture. No aggressive appearing focal osseous lesion or focal pathologic process. Soft tissues and spinal canal: No prevertebral fluid or swelling. No visible canal hematoma. Upper chest: Centrilobular emphysematous changes. Other: Atherosclerotic plaque of the carotid arteries within the neck. IMPRESSION: 1. No acute intracranial abnormality. 2. No acute displaced fracture or traumatic listhesis of the cervical spine. 3.  Emphysema (ICD10-J43.9). Electronically Signed   By: Morgane  Naveau M.D.   On: 02/15/2023 19:49   CT Cervical Spine Wo Contrast Result Date: 02/15/2023 CLINICAL DATA:  Polytrauma, blunt Trauma fall in bathroom, foot was tangled in rug resulting in fall, no thinners. Pain in left hip area when trying to weight bear. Pt has dementia. No LOC and did not hit head. Lives with daughter. 130/59-76-95% RA EXAM: CT HEAD WITHOUT CONTRAST CT CERVICAL SPINE WITHOUT CONTRAST TECHNIQUE: Multidetector CT imaging of the head and cervical spine was performed following the standard protocol without intravenous contrast. Multiplanar CT image reconstructions of the cervical spine were also generated. RADIATION DOSE REDUCTION: This exam was performed according to the  departmental dose-optimization program which includes automated exposure control, adjustment of the mA and/or kV according to patient size and/or use of iterative reconstruction technique. COMPARISON:  CT chest 02/15/2023, CT head C-spine 07/06/2020 FINDINGS: CT HEAD FINDINGS Brain: Cerebral ventricle sizes are concordant with the degree of cerebral volume loss. Patchy and confluent areas of decreased attenuation are noted throughout the deep and periventricular white matter of the cerebral hemispheres bilaterally, compatible with chronic microvascular ischemic disease. No evidence of large-territorial acute infarction. No parenchymal hemorrhage. No mass lesion. No extra-axial collection. No mass effect or midline shift. No hydrocephalus. Basilar cisterns are patent. Vascular: No hyperdense vessel. Skull: No acute fracture or focal lesion. Sinuses/Orbits: Paranasal sinuses and mastoid air cells are clear. The orbits are unremarkable. Other: None. CT CERVICAL SPINE FINDINGS Alignment: Normal. Skull base and vertebrae: Multilevel mild moderate degenerative changes spine. No severe osseous neural foraminal or central canal stenosis. No acute fracture. No aggressive appearing focal osseous lesion or focal pathologic process. Soft tissues and spinal canal: No prevertebral fluid or swelling. No visible canal hematoma. Upper chest: Centrilobular emphysematous  changes. Other: Atherosclerotic plaque of the carotid arteries within the neck. IMPRESSION: 1. No acute intracranial abnormality. 2. No acute displaced fracture or traumatic listhesis of the cervical spine. 3.  Emphysema (ICD10-J43.9). Electronically Signed   By: Morgane  Naveau M.D.   On: 02/15/2023 19:49   DG Femur Portable Min 2 Views Left Result Date: 02/15/2023 CLINICAL DATA:  Fall; Leg Injury EXAM: LEFT FEMUR PORTABLE 2 VIEWS COMPARISON:  X-ray left hip FINDINGS: Total left hip arthroplasty. At least mild to moderate tibiofemoral compartment joint degenerative  changes. There is no evidence of fracture or other focal bone lesions. Soft tissues are unremarkable. Vascular calcifications. IMPRESSION: Negative for acute traumatic injury. Electronically Signed   By: Morgane  Naveau M.D.   On: 02/15/2023 19:17   DG Chest 2 View Result Date: 02/15/2023 CLINICAL DATA:  fall EXAM: CHEST - 2 VIEW COMPARISON:  Chest x-ray 09/14/2021, CT chest 09/15/2021 FINDINGS: Patient is slightly rotated making the hilar vascular slightly more prominent than the right. The heart and mediastinal contours are unchanged. Atherosclerotic plaque. No focal consolidation. Chronic coarsened markings with no overt pulmonary edema. No pleural effusion. No pneumothorax. No acute osseous abnormality. IMPRESSION: 1. No active cardiopulmonary disease. 2.  Aortic Atherosclerosis (ICD10-I70.0). Electronically Signed   By: Morgane  Naveau M.D.   On: 02/15/2023 19:16   DG Hip Unilat With Pelvis 2-3 Views Left Result Date: 02/15/2023 CLINICAL DATA:  PAIN, fall EXAM: DG HIP (WITH OR WITHOUT PELVIS) 2-3V LEFT COMPARISON:  None Available. FINDINGS: Total left hip arthroplasty. No radiograph findings to suggest surgical hardware complication. No acute displaced fracture or dislocation of the right hip on frontal view. No acute displaced fracture or diastasis of the bones of the pelvis. There is no evidence of severe arthropathy or other focal bone abnormality. IMPRESSION: 1. Negative for acute traumatic injury. 2. Total left hip arthroplasty. Electronically Signed   By: Morgane  Naveau M.D.   On: 02/15/2023 19:14    Procedures Procedures    Medications Ordered in ED Medications  acetaminophen  (TYLENOL ) tablet 650 mg (has no administration in time range)    Or  acetaminophen  (TYLENOL ) suppository 650 mg (has no administration in time range)  melatonin tablet 3 mg (has no administration in time range)  ondansetron  (ZOFRAN ) injection 4 mg (has no administration in time range)  naloxone  (NARCAN ) injection  0.4 mg (has no administration in time range)  oxyCODONE -acetaminophen  (PERCOCET/ROXICET) 5-325 MG per tablet 1 tablet (has no administration in time range)  fentaNYL  (SUBLIMAZE ) injection 25 mcg (has no administration in time range)  lidocaine  (LIDODERM ) 5 % 1 patch (has no administration in time range)  acetaminophen  (TYLENOL ) tablet 650 mg (650 mg Oral Given 02/15/23 1731)  fentaNYL  (SUBLIMAZE ) injection 50 mcg (50 mcg Intravenous Given 02/15/23 1730)  iohexol  (OMNIPAQUE ) 300 MG/ML solution 100 mL (100 mLs Intravenous Contrast Given 02/15/23 1915)  oxyCODONE  (Oxy IR/ROXICODONE ) immediate release tablet 5 mg (5 mg Oral Given 02/15/23 2018)    ED Course/ Medical Decision Making/ A&P                                 Medical Decision Making Amount and/or Complexity of Data Reviewed Labs: ordered. Radiology: ordered.  Risk OTC drugs. Prescription drug management. Decision regarding hospitalization.   [name] is here with left thigh and hip pain after fall.  Did not hit his head or lose consciousness.  This was a witnessed fall with family at the  bedside.  He is got tenderness to his left hip, tenderness to his right flank with abrasions and some may be lower abdominal pain.  Ultimately we will get CT scan of the head neck chest abdomen pelvis to evaluate for rib fractures, head injury.  Will get x-rays of the pelvis femur and CT scan should also help further evaluate for pelvic or hip fracture as well.  Will look for compression fracture with the CT lumbar spine as well.  Overall he has a right sided rib fracture, no obvious other injuries on CT images.  Negative hip x-ray and pelvic CT.  He is still in significant pain in the ribs and into his left hip.  Overall we will admit him for further pain control PT and OT.  May need to consider MRI of the hip to further evaluate for fracture or soft tissue injury.  Due to his inability to really ambulate very well at baseline with a walker and family  not really feeling safe with him being at home I think observation stay for pain control PT and OT and likely placement is necessary.  Admitted to medicine.  This chart was dictated using voice recognition software.  Despite best efforts to proofread,  errors can occur which can change the documentation meaning.         Final Clinical Impression(s) / ED Diagnoses Final diagnoses:  Fall, initial encounter  Contusion of left hip, initial encounter  Closed fracture of one rib of right side, initial encounter    Rx / DC Orders ED Discharge Orders     None         Ruthe Cornet, DO 02/15/23 2130

## 2023-02-16 ENCOUNTER — Observation Stay (HOSPITAL_COMMUNITY): Payer: Medicare Other

## 2023-02-16 DIAGNOSIS — R262 Difficulty in walking, not elsewhere classified: Secondary | ICD-10-CM | POA: Diagnosis not present

## 2023-02-16 DIAGNOSIS — M25552 Pain in left hip: Secondary | ICD-10-CM | POA: Diagnosis not present

## 2023-02-16 DIAGNOSIS — Z96642 Presence of left artificial hip joint: Secondary | ICD-10-CM | POA: Diagnosis not present

## 2023-02-16 DIAGNOSIS — M79652 Pain in left thigh: Secondary | ICD-10-CM | POA: Diagnosis not present

## 2023-02-16 LAB — CBC WITH DIFFERENTIAL/PLATELET
Abs Immature Granulocytes: 0.03 10*3/uL (ref 0.00–0.07)
Basophils Absolute: 0 10*3/uL (ref 0.0–0.1)
Basophils Relative: 0 %
Eosinophils Absolute: 0 10*3/uL (ref 0.0–0.5)
Eosinophils Relative: 0 %
HCT: 33.9 % — ABNORMAL LOW (ref 39.0–52.0)
Hemoglobin: 10.7 g/dL — ABNORMAL LOW (ref 13.0–17.0)
Immature Granulocytes: 0 %
Lymphocytes Relative: 17 %
Lymphs Abs: 1.3 10*3/uL (ref 0.7–4.0)
MCH: 33 pg (ref 26.0–34.0)
MCHC: 31.6 g/dL (ref 30.0–36.0)
MCV: 104.6 fL — ABNORMAL HIGH (ref 80.0–100.0)
Monocytes Absolute: 0.8 10*3/uL (ref 0.1–1.0)
Monocytes Relative: 11 %
Neutro Abs: 5.4 10*3/uL (ref 1.7–7.7)
Neutrophils Relative %: 72 %
Platelets: 156 10*3/uL (ref 150–400)
RBC: 3.24 MIL/uL — ABNORMAL LOW (ref 4.22–5.81)
RDW: 12.1 % (ref 11.5–15.5)
WBC: 7.7 10*3/uL (ref 4.0–10.5)
nRBC: 0 % (ref 0.0–0.2)

## 2023-02-16 LAB — COMPREHENSIVE METABOLIC PANEL
ALT: 15 U/L (ref 0–44)
AST: 18 U/L (ref 15–41)
Albumin: 2.8 g/dL — ABNORMAL LOW (ref 3.5–5.0)
Alkaline Phosphatase: 77 U/L (ref 38–126)
Anion gap: 6 (ref 5–15)
BUN: 29 mg/dL — ABNORMAL HIGH (ref 8–23)
CO2: 23 mmol/L (ref 22–32)
Calcium: 8.2 mg/dL — ABNORMAL LOW (ref 8.9–10.3)
Chloride: 108 mmol/L (ref 98–111)
Creatinine, Ser: 1.2 mg/dL (ref 0.61–1.24)
GFR, Estimated: >60 mL/min (ref >60)
Glucose, Bld: 106 mg/dL — ABNORMAL HIGH (ref 70–99)
Potassium: 4.9 mmol/L (ref 3.5–5.1)
Sodium: 137 mmol/L (ref 135–145)
Total Bilirubin: 0.5 mg/dL (ref 0.0–1.2)
Total Protein: 5.7 g/dL — ABNORMAL LOW (ref 6.5–8.1)

## 2023-02-16 LAB — URINALYSIS, COMPLETE (UACMP) WITH MICROSCOPIC
Bacteria, UA: NONE SEEN
Bilirubin Urine: NEGATIVE
Glucose, UA: NEGATIVE mg/dL
Hgb urine dipstick: NEGATIVE
Ketones, ur: 5 mg/dL — AB
Leukocytes,Ua: NEGATIVE
Nitrite: NEGATIVE
Protein, ur: NEGATIVE mg/dL
Specific Gravity, Urine: 1.035 — ABNORMAL HIGH (ref 1.005–1.030)
pH: 5 (ref 5.0–8.0)

## 2023-02-16 LAB — MAGNESIUM: Magnesium: 2 mg/dL (ref 1.7–2.4)

## 2023-02-16 MED ORDER — ACETAMINOPHEN 500 MG PO TABS
500.0000 mg | ORAL_TABLET | Freq: Three times a day (TID) | ORAL | Status: DC
  Administered 2023-02-16 – 2023-02-21 (×14): 500 mg via ORAL
  Filled 2023-02-16 (×16): qty 1

## 2023-02-16 NOTE — Evaluation (Signed)
 Occupational Therapy Evaluation Patient Details Name: [name] MRN: [id number] DOB: [birth year] Today's Date: 02/16/2023   History of Present Illness 83 yr old male who is admitted to [facility] on 02/15/2023 with acute left hip pain after a fall. Pt found to have acute nondisplaced R 10th rib fracture. Imaging negative for acute L hip fracture; additional imaging may be ordered. PMH: dementia, left hip posterior hemiarthroplasty 2022, essential pretension, hyperlipidemia, chronic diastolic heart failure, CKD 3A   Clinical Impression   The pt is currently presenting below his baseline level of functioning for ADL management, as he is limited by the below listed deficits (see OT problem list). Pt currently requires increased assist for tasks including lower body dressing, supine to sit, and toileting. Pt noted to present with increased L LE pain with activity with associated guarding of movements and compromised activity tolerance. Further OT services are recommended to facilitate progressive ADL performance and to decrease the risk for falls and restricted participation in meaningful activities. Patient will benefit from continued inpatient follow up therapy, <3 hours/day.         If plan is discharge home, recommend the following: Assist for transportation;Assistance with cooking/housework;Help with stairs or ramp for entrance;A lot of help with walking and/or transfers;A lot of help with bathing/dressing/bathroom;Direct supervision/assist for medications management;Supervision due to cognitive status;Direct supervision/assist for financial management    Functional Status Assessment  Patient has had a recent decline in their functional status and demonstrates the ability to make significant improvements in function in a reasonable and predictable amount of time.  Equipment Recommendations  Other (comment) (defer to next level of care)    Recommendations for Other Services        Precautions / Restrictions Precautions Precautions: Fall Restrictions Weight Bearing Restrictions Per Provider Order: No Other Position/Activity Restrictions:  (none noted in chart at current, further imaging may be ordered)      Mobility Bed Mobility Overal bed mobility: Needs Assistance Bed Mobility: Supine to Sit, Sit to Supine     Supine to sit: Mod assist, Used rails, HOB elevated Sit to supine: Min assist (required assist for L LE; increased time and effort needed)        Transfers Overall transfer level: Needs assistance Equipment used: Rolling walker (2 wheels) Transfers: Sit to/from Stand Sit to Stand: Min assist, From elevated surface                  Balance     Sitting balance-Leahy Scale: Fair       Standing balance-Leahy Scale: Poor       ADL either performed or assessed with clinical judgement   ADL Overall ADL's : Needs assistance/impaired Eating/Feeding: Set up;Sitting   Grooming: Set up;Sitting           Upper Body Dressing : Supervision/safety;Set up;Sitting   Lower Body Dressing: Maximal assistance;Sitting/lateral leans       Toileting- Clothing Manipulation and Hygiene: Maximal assistance Toileting - Clothing Manipulation Details (indicate cue type and reason): at bedside commode level, based on clinical judgement             Vision Baseline Vision/History: 1 Wears glasses              Pertinent Vitals/Pain Pain Assessment Pain Assessment: Faces Pain Score: 6  Pain Location: L LE with activity Pain Intervention(s): Limited activity within patient's tolerance, Monitored during session, Repositioned     Extremity/Trunk Assessment Upper Extremity Assessment Upper Extremity Assessment: Overall WFL for  tasks assessed;Left hand dominant   Lower Extremity Assessment Lower Extremity Assessment: Defer to PT evaluation       Communication     Cognition Arousal: Alert Behavior During Therapy: [facility] for  tasks assessed/performed Overall Cognitive Status: History of cognitive impairments - at baseline Area of Impairment: Memory          General Comments: impaired memory, oriented to person and place, disoriented to time and situation, able to follow 1 step commands consistently     General Comments               Home Living Family/patient expects to be discharged to:: Unsure Living Arrangements: Children (daughter) Available Help at Discharge: Family Type of Home: House Home Access: Stairs to enter Secretary/administrator of Steps: 3 Entrance Stairs-Rails: Right;Left Home Layout: One level               Home Equipment: Agricultural Consultant (2 wheels);BSC/3in1;Wheelchair - manual;Shower seat   Additional Comments: [name] was present and provided info regarding pt's prior level of functioning and living situation, as the pt had some difficulty with memory/recall.      Prior Functioning/Environment Prior Level of Function : Needs assist  Cognitive Assist : Mobility (cognitive);ADLs (cognitive)     Physical Assist : ADLs (physical)   ADLs (physical): Bathing;Dressing;IADLs Mobility Comments:  (He used a RW inside and outside the home.) ADLs Comments: Pt required occasional supervision for bathing, increased time for dressing tasks & occasional light assistance. He managed toileting without assist and his daughter managed cooking, cleaning, and driving.        OT Problem List: Impaired balance (sitting and/or standing);Decreased activity tolerance;Decreased cognition;Decreased knowledge of use of DME or AE;Decreased knowledge of precautions;Pain;Decreased strength      OT Treatment/Interventions: Self-care/ADL training;Therapeutic exercise;Energy conservation;DME and/or AE instruction;Balance training;Patient/family education;Cognitive remediation/compensation;Therapeutic activities    OT Goals(Current goals can be found in the care plan section) Acute Rehab OT  Goals Patient Stated Goal: decreased pain and to get better OT Goal Formulation: With patient/family Time For Goal Achievement: 03/02/23 Potential to Achieve Goals: Good ADL Goals Pt Will Perform Lower Body Dressing: sit to/from stand;with contact guard assist;sitting/lateral leans;with adaptive equipment Pt Will Transfer to Toilet: with contact guard assist;ambulating;grab bars Pt Will Perform Toileting - Clothing Manipulation and hygiene: with contact guard assist;sit to/from stand  OT Frequency: Min 1X/week       AM-PAC OT 6 Clicks Daily Activity     Outcome Measure Help from another person eating meals?: A Little Help from another person taking care of personal grooming?: A Little Help from another person toileting, which includes using toliet, bedpan, or urinal?: A Lot Help from another person bathing (including washing, rinsing, drying)?: A Lot Help from another person to put on and taking off regular upper body clothing?: A Little Help from another person to put on and taking off regular lower body clothing?: A Lot 6 Click Score: 15   End of Session Equipment Utilized During Treatment: Rolling walker (2 wheels) Nurse Communication: Other (comment)  Activity Tolerance: Patient limited by pain Patient left: in bed;with call bell/phone within reach;with bed alarm set;with family/visitor present  OT Visit Diagnosis: Unsteadiness on feet (R26.81);Pain;Other abnormalities of gait and mobility (R26.89);Muscle weakness (generalized) (M62.81) Pain - Right/Left: Left Pain - part of body: Leg                Time: 1635-1650 OT Time Calculation (min): 15 min Charges:  OT General Charges $OT Visit: 1 Visit  OT Evaluation $OT Eval Moderate Complexity: 1 Mod    Naomy Esham L Whittney Steenson, OTR/L 02/16/2023, 5:41 PM

## 2023-02-16 NOTE — Care Management Obs Status (Signed)
 MEDICARE OBSERVATION STATUS NOTIFICATION   Patient Details  Name: [name] MRN: [id number] Date of Birth: [birth year]   Medicare Observation Status Notification Given:  Yes    Ewing Schlein, LCSW 02/16/2023, 3:20 PM

## 2023-02-16 NOTE — Evaluation (Addendum)
 Physical Therapy Evaluation Patient Details Name: [name] MRN: [id number] DOB: 1941/02/07 Today's Date: 02/16/2023  History of Present Illness  83 y.o.male who is admitted to [facility] on 02/15/2023 with acute left hip pain after presenting from home to [facility] ED complaining of fall. Found to have acute nondisplaced 10th rib fx. xrays negative. CT L hip pending.  PMH: dementia, left hip posterior hemiarthroplasty 2022, essential pretension, hyperlipidemia, chronic diastolic heart failure, CKD 3A,  Clinical Impression  Pt admitted with above diagnosis.  Pt lives with dtr and is ambulatory with RW  at baseline. Dtr is supportive caregiver. pt stood this session ~TDWB LLE on advice of PT; c/o pain mid shaft L femur in standing. no pain with palpation-firm touch, denies pain in sitting. pt was OOB to chair with nursing staff earlier however deferred at this time d/t pain and pending imaging. Continue to follow. Discussed with  dtr. Patient may benefit from continued follow up therapy, <3 hours/day post acute    Pt currently with functional limitations due to the deficits listed below (see PT Problem List). Pt will benefit from acute skilled PT to increase their independence and safety with mobility to allow discharge.           If plan is discharge home, recommend the following: Help with stairs or ramp for entrance;Assistance with cooking/housework;Assist for transportation;A lot of help with walking and/or transfers;A lot of help with bathing/dressing/bathroom   Can travel by private vehicle   No    Equipment Recommendations None recommended by PT  Recommendations for Other Services       Functional Status Assessment       Precautions / Restrictions Precautions Precautions: Fall Restrictions Weight Bearing Restrictions Per Provider Order: No Other Position/Activity Restrictions: none at this time per orders-CT L hip pending. pt stood this session TDWB L, c/o pain mid  shaft L femur in standing. no pain with palpation, firm touch. pt was OOB to chair with nursing staff earlier      Mobility  Bed Mobility Overal bed mobility: Needs Assistance Bed Mobility: Supine to Sit, Sit to Supine     Supine to sit: Mod assist, +2 for safety/equipment Sit to supine: Max assist, +2 for physical assistance, +2 for safety/equipment, HOB elevated   General bed mobility comments: assist to guide LEs off bed, pt able to assist with lateral scooting in supine, assist to elevate trunk; HOB at 30 degrees    Transfers Overall transfer level: Needs assistance Equipment used: Rolling walker (2 wheels) Transfers: Sit to/from Stand Sit to Stand: Min assist, +2 safety/equipment, From elevated surface           General transfer comment: cues to place wt on RLE and for hand placement. pt ~ TDWB LLE in standing and c/o  pain mid shaft L femur in standing; pt advised to continue to avoid wt on LLE, deferred taking steps or transfering to chair at this time    Ambulation/Gait                  Stairs            Wheelchair Mobility     Tilt Bed    Modified Rankin (Stroke Patients Only)       Balance Overall balance assessment: Needs assistance, History of Falls Sitting-balance support: Feet supported, Single extremity supported, No upper extremity supported Sitting balance-Leahy Scale: Fair Sitting balance - Comments: able to maintain midline and balance with =out support after ~  1 minute in sitting Postural control: Posterior lean   Standing balance-Leahy Scale: Poor Standing balance comment: reliant on UEs                             Pertinent Vitals/Pain Pain Assessment Pain Assessment: Faces Faces Pain Scale: Hurts even more Pain Location: back, chest/ribs, L mid thigh Pain Descriptors / Indicators: Grimacing, Sore Pain Intervention(s): Limited activity within patient's tolerance, Monitored during session, Repositioned     Home Living Family/patient expects to be discharged to:: Unsure Living Arrangements: Children Available Help at Discharge: Family Type of Home: House Home Access: Stairs to enter Entrance Stairs-Rails: Right Entrance Stairs-Number of Steps: 2   Home Layout: One level Home Equipment: Agricultural Consultant (2 wheels)      Prior Function Prior Level of Function : Needs assist  Cognitive Assist : Mobility (cognitive);ADLs (cognitive)     Physical Assist : ADLs (physical)   ADLs (physical): Bathing;Dressing;IADLs Mobility Comments: amb with RW; supervision per dtr       Extremity/Trunk Assessment   Upper Extremity Assessment Upper Extremity Assessment: Defer to OT evaluation    Lower Extremity Assessment Lower Extremity Assessment: RLE deficits/detail;LLE deficits/detail RLE Deficits / Details: AAROM grossly WFL, strength testing limited by pain - mostly d/t rib pain with LE movement; denies N or T RLE: Unable to fully assess due to pain LLE Deficits / Details: AAROM grossly WFL, strength testing limited by pain - mostly d/t rib pain with LE movement; denies N or T LLE: Unable to fully assess due to pain       Communication      Cognition Arousal: Alert Behavior During Therapy: WFL for tasks assessed/performed Overall Cognitive Status: History of cognitive impairments - at baseline                                 General Comments: dtr present; pt answers some questions, dtr assists-confirms or denies accuracy pt response; pt is cooperative and follows one step commands consistently        General Comments      Exercises     Assessment/Plan    PT Assessment Patient needs continued PT services  PT Problem List Decreased strength;Decreased range of motion;Decreased activity tolerance;Decreased balance;Decreased mobility;Decreased knowledge of precautions;Decreased knowledge of use of DME;Decreased cognition       PT Treatment Interventions DME  instruction;Gait training;Functional mobility training;Therapeutic activities;Therapeutic exercise;Patient/family education;Balance training    PT Goals (Current goals can be found in the Care Plan section)  Acute Rehab PT Goals Patient Stated Goal: back to being able to move around house PT Goal Formulation: With patient/family Time For Goal Achievement: 03/02/23 Potential to Achieve Goals: Good    Frequency Min 1X/week     Co-evaluation               AM-PAC PT 6 Clicks Mobility  Outcome Measure Help needed turning from your back to your side while in a flat bed without using bedrails?: Total Help needed moving from lying on your back to sitting on the side of a flat bed without using bedrails?: Total Help needed moving to and from a bed to a chair (including a wheelchair)?: Total Help needed standing up from a chair using your arms (e.g., wheelchair or bedside chair)?: A Lot Help needed to walk in hospital room?: Total Help needed climbing 3-5 steps with a railing? :  Total 6 Click Score: 7    End of Session Equipment Utilized During Treatment: Gait belt Activity Tolerance: Patient limited by pain Patient left: with call bell/phone within reach;in bed;with family/visitor present   PT Visit Diagnosis: Other abnormalities of gait and mobility (R26.89)    Time: 8471-8444 PT Time Calculation (min) (ACUTE ONLY): 27 min   Charges:   PT Evaluation $PT Eval Low Complexity: 1 Low PT Treatments $Therapeutic Activity: 8-22 mins PT General Charges $$ ACUTE PT VISIT: 1 Visit         Atlantis Delong, PT  Acute Rehab Dept ([facility]) [phone]  02/16/2023   [facility] 02/16/2023, 4:12 PM

## 2023-02-16 NOTE — Progress Notes (Signed)
 PROGRESS NOTE    [name]  FMW:[id number] DOB: May 07, 1940 DOA: 02/15/2023 PCP: Charlott Dorn LABOR, MD   Brief Narrative: 83 year old past medical history significant for dementia, left total hip arthroplasty, essential hypertension, hyperlipidemia, chronic diastolic heart failure, CKD 3A, with baseline creatinine 1.3--1.7 presented to [facility] with acute left hip pain after a mechanical fall.  Patient with uncontrolled pain admitted for pain management.  He was found to have acute nondisplaced right 10th rib fracture.   Assessment & Plan:   Principal Problem:   Acute hip pain, left Active Problems:   HLD (hyperlipidemia)   GERD (gastroesophageal reflux disease)   CKD stage 3a, GFR 45-59 ml/min (HCC)   Right rib fracture   Fall   Infrarenal abdominal aortic aneurysm (AAA) without rupture (HCC)   Chronic diastolic CHF (congestive heart failure) (HCC)  1-Acute left hip pain, Right leg pain History of total hip arthroplasty May 2022 Presented after mechanical fall. X-ray negative for acute traumatic injury, total left hip arthroplasty.  Femur x-ray negative for acute traumatic injury. He has not been able to put weight on the left leg, pain is located near the knee and thigh. Will proceed with CT femur and CT knee Will need PT OT. Will schedule oral Tylenol . As needed oxycodone   2-Acute nondisplaced right 10th rib fracture -Continue with pain management and incentive spirometry  Ground-level mechanical fall: -CT head no acute intracranial abnormality.  No acute displaced fracture or traumatic listhesis of the cervical spine. -CT abdomen and pelvis acute nondisplaced right 10th rib fracture.  No acute intrathoracic intra-abdominal or intrapelvic traumatic injury.  No acute traumatic injury of the thoracic or lumbar spine.  Infrarenal abdominal aortic aneurysmal: -Notice on CT abdomen and pelvis measuring up to 3 cm.  Follow-up as an  outpatient  Hyperlipidemia -Continue with Pravachol    GERD: -Continue with Protonix   Chronic diastolic heart failure: -Holding spironolactone , continue with torsemide   CKD stage IIIa: Creatinine baseline 1.3--- 1.7 -Holding spironolactone .  Monitor on torsemide    Estimated body mass index is 22.37 kg/m as calculated from the following:   Height as of this encounter: 6' (1.829 m).   Weight as of this encounter: 74.8 kg.   DVT prophylaxis: Lovenox  Code Status: DNR Family Communication: Daughter who was at bedside Disposition Plan:  Status is: Observation The patient remains OBS appropriate and will d/c before 2 midnights.    Consultants:  None  Procedures:  none  Antimicrobials:    Subjective: He report left thigh pain and knee pain.  He has not been able to put weight on the left leg.  He denies left hip pain He report chest pain at the site of the rib fracture.  Objective: Vitals:   02/15/23 2245 02/16/23 0102 02/16/23 0447 02/16/23 0539  BP: (!) 170/65 118/65  (!) 141/79  Pulse: (!) 55 (!) 52  (!) 53  Resp: 16 16  17   Temp: 98.5 F (36.9 C) 99.1 F (37.3 C)  98.7 F (37.1 C)  TempSrc: Oral Oral  Oral  SpO2: 93% 91%  93%  Weight: 74.8 kg  74.8 kg   Height: 6' (1.829 m)       Intake/Output Summary (Last 24 hours) at 02/16/2023 0657 Last data filed at 02/16/2023 0532 Gross per 24 hour  Intake 240 ml  Output 200 ml  Net 40 ml   Filed Weights   02/15/23 1637 02/15/23 2245 02/16/23 0447  Weight: 73.3 kg 74.8 kg 74.8 kg    Examination:  General  exam: Appears calm and comfortable  Respiratory system: Clear to auscultation. Respiratory effort normal. Cardiovascular system: S1 & S2 heard, RRR. No JVD, murmurs, rubs, gallops or clicks. No pedal edema. Gastrointestinal system: Abdomen is nondistended, soft and nontender. No organomegaly or masses felt. Normal bowel sounds heard. Central nervous system: Alert and oriented. No focal neurological  deficits. Extremities: Symmetric 5 x 5 power.   Data Reviewed: I have personally reviewed following labs and imaging studies  CBC: Recent Labs  Lab 02/15/23 1723 02/16/23 0412  WBC 8.6 7.7  NEUTROABS 6.2 5.4  HGB 12.4* 10.7*  HCT 39.9 33.9*  MCV 105.8* 104.6*  PLT 176 156   Basic Metabolic Panel: Recent Labs  Lab 02/15/23 1723 02/15/23 2117 02/16/23 0412  NA 137  --  137  K 4.4  --  4.9  CL 107  --  108  CO2 25  --  23  GLUCOSE 74  --  106*  BUN 32*  --  29*  CREATININE 1.34*  --  1.20  CALCIUM  8.5*  --  8.2*  MG  --  2.4 2.0   GFR: Estimated Creatinine Clearance: 49.3 mL/min (by C-G formula based on SCr of 1.2 mg/dL). Liver Function Tests: Recent Labs  Lab 02/16/23 0412  AST 18  ALT 15  ALKPHOS 77  BILITOT 0.5  PROT 5.7*  ALBUMIN  2.8*   No results for input(s): LIPASE, AMYLASE in the last 168 hours. No results for input(s): AMMONIA in the last 168 hours. Coagulation Profile: No results for input(s): INR, PROTIME in the last 168 hours. Cardiac Enzymes: No results for input(s): CKTOTAL, CKMB, CKMBINDEX, TROPONINI in the last 168 hours. BNP (last 3 results) Recent Labs    08/26/22 1235  PROBNP 621*   HbA1C: No results for input(s): HGBA1C in the last 72 hours. CBG: No results for input(s): GLUCAP in the last 168 hours. Lipid Profile: No results for input(s): CHOL, HDL, LDLCALC, TRIG, CHOLHDL, LDLDIRECT in the last 72 hours. Thyroid  Function Tests: No results for input(s): TSH, T4TOTAL, FREET4, T3FREE, THYROIDAB in the last 72 hours. Anemia Panel: No results for input(s): VITAMINB12, FOLATE, FERRITIN, TIBC, IRON , RETICCTPCT in the last 72 hours. Sepsis Labs: No results for input(s): PROCALCITON, LATICACIDVEN in the last 168 hours.  No results found for this or any previous visit (from the past 240 hours).       Radiology Studies: CT CHEST ABDOMEN PELVIS W CONTRAST Result Date:  02/15/2023 CLINICAL DATA:  Polytrauma, blunt EXAM: CT CHEST, ABDOMEN, AND PELVIS WITH CONTRAST TECHNIQUE: Multidetector CT imaging of the chest, abdomen and pelvis was performed following the standard protocol during bolus administration of intravenous contrast. RADIATION DOSE REDUCTION: This exam was performed according to the departmental dose-optimization program which includes automated exposure control, adjustment of the mA and/or kV according to patient size and/or use of iterative reconstruction technique. CONTRAST:  OMNIPAQUE  IOHEXOL  300 MG/ML  SOLN COMPARISON:  CT abdomen pelvis 02/19/2016 FINDINGS: CHEST: Cardiovascular: No aortic injury. The thoracic aorta is normal in caliber. The heart is normal in size. No significant pericardial effusion. Moderate atherosclerotic plaque. Four-vessel coronary artery calcification. Mediastinum/Nodes: No pneumomediastinum. No mediastinal hematoma. The esophagus is unremarkable. The thyroid  is unremarkable. The central airways are patent. No mediastinal, hilar, or axillary lymphadenopathy. Lungs/Pleura: Centrilobular emphysematous changes. No focal consolidation. No pulmonary nodule. No pulmonary mass. No pulmonary contusion or laceration. No pneumatocele formation. No pleural effusion. No pneumothorax. No hemothorax. Musculoskeletal/Chest wall: No chest wall mass. Acute nondisplaced right tenth rib fracture (3:  55). No spinal fracture. ABDOMEN / PELVIS: Hepatobiliary: Not enlarged. No focal lesion. No laceration or subcapsular hematoma. Status post cholecystectomy.  No biliary ductal dilatation. Pancreas: Normal pancreatic contour. No main pancreatic duct dilatation. Spleen: Not enlarged. No focal lesion. No laceration, subcapsular hematoma, or vascular injury. Adrenals/Urinary Tract: No nodularity bilaterally. Bilateral kidneys enhance symmetrically. Bilateral nephrolithiasis, measuring up to 4 mm on the right. No hydronephrosis. No contusion, laceration, or  subcapsular hematoma. No injury to the vascular structures or collecting systems. No hydroureter. The urinary bladder is unremarkable. On delayed imaging, there is no urothelial wall thickening and there are no filling defects in the opacified portions of the bilateral collecting systems or ureters. Stomach/Bowel: No small or large bowel wall thickening or dilatation. Status post appendectomy. Vasculature/Lymphatics: Aneurysmal infrarenal abdominal aorta measuring up to 3 cm. Severe atherosclerotic plaque. No iliac aneurysm. No active contrast extravasation or pseudoaneurysm. No abdominal, pelvic, inguinal lymphadenopathy. Reproductive: Normal. Other: No simple free fluid ascites. No pneumoperitoneum. No hemoperitoneum. No mesenteric hematoma identified. No organized fluid collection. Musculoskeletal: No significant soft tissue hematoma. Diffusely decreased bone density. No acute pelvic fracture. Please see separately dictated CT lumbar spine 02/15/2023. Total left hip arthroplasty partially visualized with no CT evidence of surgical hardware complication. Old healed left hip fracture (3:130). Ports and Devices: None. IMPRESSION: 1. Acute nondisplaced right tenth rib fracture. 2. No acute intrathoracic, intra-abdominal, intrapelvic traumatic injury. 3. No acute fracture or traumatic malalignment of the thoracic spine. 4. Please see separately dictated CT lumbar spine 02/15/2023. Other imaging findings of potential clinical significance: 1. Atherosclerotic plaque of the abdominal aorta. Aortic Atherosclerosis (ICD10-I70.0). 2. Aneurysmal infrarenal abdominal aorta measuring up to 3 cm. Recommend follow-up ultrasound every 3 years. (Ref.: J Vasc Surg. 2018; 67:2-77 and J Am Coll Radiol 2013;10(10):789-794.) Aortic aneurysm NOS (ICD10-I71.9). 3.  Emphysema (ICD10-J43.9). Electronically Signed   By: Morgane  Naveau M.D.   On: 02/15/2023 20:06   CT L-SPINE NO CHARGE Result Date: 02/15/2023 CLINICAL DATA:  [id number] Pain  144615 bathroom, foot was tangled in rug resulting in fall, no thinners. Pain in left hip area when trying to weight bear. Pt has dementia. No LOC and did not hit head. Lives with daughter. 130/59-76-95% RA EXAM: CT LUMBAR SPINE WITHOUT CONTRAST TECHNIQUE: Multidetector CT imaging of the lumbar spine was performed without intravenous contrast administration. Multiplanar CT image reconstructions were also generated. RADIATION DOSE REDUCTION: This exam was performed according to the departmental dose-optimization program which includes automated exposure control, adjustment of the mA and/or kV according to patient size and/or use of iterative reconstruction technique. COMPARISON:  CT abdomen pelvis 02/19/2016 FINDINGS: Segmentation: 5 lumbar type vertebrae. Alignment: Normal. Vertebrae: Diffusely decreased bone density. Multilevel mild degenerative changes of the spine. Moderate facet arthropathy at the L5-S1 level. No acute fracture or focal pathologic process. Paraspinal and other soft tissues: Negative. Disc levels: Mild intervertebral disc space narrowing at the L2-L3 level. Other: Atherosclerotic plaque of the abdominal aorta. Aneurysmal infrarenal abdominal aorta measuring up to 3 cm. Please see separately dictated CT abdomen pelvis 02/15/2023. IMPRESSION: 1. No acute displaced fracture or traumatic listhesis of the lumbar spine. 2. Diffusely decreased bone density. 3. Atherosclerotic plaque of the abdominal aorta. Aortic Atherosclerosis (ICD10-I70.0). 4. Aneurysmal infrarenal abdominal aorta measuring up to 3 cm. Recommend follow-up ultrasound every 3 years. (Ref.: J Vasc Surg. 2018; 67:2-77 and J Am Coll Radiol 2013;10(10):789-794.) Aortic aneurysm NOS (ICD10-I71.9). Electronically Signed   By: Morgane  Naveau M.D.   On: 02/15/2023 19:54   CT HEAD WO  CONTRAST ( ) Result Date: 02/15/2023 CLINICAL DATA:  Polytrauma, blunt Trauma fall in bathroom, foot was tangled in rug resulting in fall, no thinners. Pain  in left hip area when trying to weight bear. Pt has dementia. No LOC and did not hit head. Lives with daughter. 130/59-76-95% RA EXAM: CT HEAD WITHOUT CONTRAST CT CERVICAL SPINE WITHOUT CONTRAST TECHNIQUE: Multidetector CT imaging of the head and cervical spine was performed following the standard protocol without intravenous contrast. Multiplanar CT image reconstructions of the cervical spine were also generated. RADIATION DOSE REDUCTION: This exam was performed according to the departmental dose-optimization program which includes automated exposure control, adjustment of the mA and/or kV according to patient size and/or use of iterative reconstruction technique. COMPARISON:  CT chest 02/15/2023, CT head C-spine 07/06/2020 FINDINGS: CT HEAD FINDINGS Brain: Cerebral ventricle sizes are concordant with the degree of cerebral volume loss. Patchy and confluent areas of decreased attenuation are noted throughout the deep and periventricular white matter of the cerebral hemispheres bilaterally, compatible with chronic microvascular ischemic disease. No evidence of large-territorial acute infarction. No parenchymal hemorrhage. No mass lesion. No extra-axial collection. No mass effect or midline shift. No hydrocephalus. Basilar cisterns are patent. Vascular: No hyperdense vessel. Skull: No acute fracture or focal lesion. Sinuses/Orbits: Paranasal sinuses and mastoid air cells are clear. The orbits are unremarkable. Other: None. CT CERVICAL SPINE FINDINGS Alignment: Normal. Skull base and vertebrae: Multilevel mild moderate degenerative changes spine. No severe osseous neural foraminal or central canal stenosis. No acute fracture. No aggressive appearing focal osseous lesion or focal pathologic process. Soft tissues and spinal canal: No prevertebral fluid or swelling. No visible canal hematoma. Upper chest: Centrilobular emphysematous changes. Other: Atherosclerotic plaque of the carotid arteries within the neck.  IMPRESSION: 1. No acute intracranial abnormality. 2. No acute displaced fracture or traumatic listhesis of the cervical spine. 3.  Emphysema (ICD10-J43.9). Electronically Signed   By: Morgane  Naveau M.D.   On: 02/15/2023 19:49   CT Cervical Spine Wo Contrast Result Date: 02/15/2023 CLINICAL DATA:  Polytrauma, blunt Trauma fall in bathroom, foot was tangled in rug resulting in fall, no thinners. Pain in left hip area when trying to weight bear. Pt has dementia. No LOC and did not hit head. Lives with daughter. 130/59-76-95% RA EXAM: CT HEAD WITHOUT CONTRAST CT CERVICAL SPINE WITHOUT CONTRAST TECHNIQUE: Multidetector CT imaging of the head and cervical spine was performed following the standard protocol without intravenous contrast. Multiplanar CT image reconstructions of the cervical spine were also generated. RADIATION DOSE REDUCTION: This exam was performed according to the departmental dose-optimization program which includes automated exposure control, adjustment of the mA and/or kV according to patient size and/or use of iterative reconstruction technique. COMPARISON:  CT chest 02/15/2023, CT head C-spine 07/06/2020 FINDINGS: CT HEAD FINDINGS Brain: Cerebral ventricle sizes are concordant with the degree of cerebral volume loss. Patchy and confluent areas of decreased attenuation are noted throughout the deep and periventricular white matter of the cerebral hemispheres bilaterally, compatible with chronic microvascular ischemic disease. No evidence of large-territorial acute infarction. No parenchymal hemorrhage. No mass lesion. No extra-axial collection. No mass effect or midline shift. No hydrocephalus. Basilar cisterns are patent. Vascular: No hyperdense vessel. Skull: No acute fracture or focal lesion. Sinuses/Orbits: Paranasal sinuses and mastoid air cells are clear. The orbits are unremarkable. Other: None. CT CERVICAL SPINE FINDINGS Alignment: Normal. Skull base and vertebrae: Multilevel mild moderate  degenerative changes spine. No severe osseous neural foraminal or central canal stenosis. No acute fracture. No aggressive appearing  focal osseous lesion or focal pathologic process. Soft tissues and spinal canal: No prevertebral fluid or swelling. No visible canal hematoma. Upper chest: Centrilobular emphysematous changes. Other: Atherosclerotic plaque of the carotid arteries within the neck. IMPRESSION: 1. No acute intracranial abnormality. 2. No acute displaced fracture or traumatic listhesis of the cervical spine. 3.  Emphysema (ICD10-J43.9). Electronically Signed   By: Morgane  Naveau M.D.   On: 02/15/2023 19:49   DG Femur Portable Min 2 Views Left Result Date: 02/15/2023 CLINICAL DATA:  Fall; Leg Injury EXAM: LEFT FEMUR PORTABLE 2 VIEWS COMPARISON:  X-ray left hip FINDINGS: Total left hip arthroplasty. At least mild to moderate tibiofemoral compartment joint degenerative changes. There is no evidence of fracture or other focal bone lesions. Soft tissues are unremarkable. Vascular calcifications. IMPRESSION: Negative for acute traumatic injury. Electronically Signed   By: Morgane  Naveau M.D.   On: 02/15/2023 19:17   DG Chest 2 View Result Date: 02/15/2023 CLINICAL DATA:  fall EXAM: CHEST - 2 VIEW COMPARISON:  Chest x-ray 09/14/2021, CT chest 09/15/2021 FINDINGS: Patient is slightly rotated making the hilar vascular slightly more prominent than the right. The heart and mediastinal contours are unchanged. Atherosclerotic plaque. No focal consolidation. Chronic coarsened markings with no overt pulmonary edema. No pleural effusion. No pneumothorax. No acute osseous abnormality. IMPRESSION: 1. No active cardiopulmonary disease. 2.  Aortic Atherosclerosis (ICD10-I70.0). Electronically Signed   By: Morgane  Naveau M.D.   On: 02/15/2023 19:16   DG Hip Unilat With Pelvis 2-3 Views Left Result Date: 02/15/2023 CLINICAL DATA:  PAIN, fall EXAM: DG HIP (WITH OR WITHOUT PELVIS) 2-3V LEFT COMPARISON:  None Available.  FINDINGS: Total left hip arthroplasty. No radiograph findings to suggest surgical hardware complication. No acute displaced fracture or dislocation of the right hip on frontal view. No acute displaced fracture or diastasis of the bones of the pelvis. There is no evidence of severe arthropathy or other focal bone abnormality. IMPRESSION: 1. Negative for acute traumatic injury. 2. Total left hip arthroplasty. Electronically Signed   By: Morgane  Naveau M.D.   On: 02/15/2023 19:14        Scheduled Meds:  divalproex   250 mg Oral BID   lidocaine   1 patch Transdermal Q24H   memantine   10 mg Oral BID   pantoprazole   40 mg Oral QAC breakfast   pravastatin   40 mg Oral QHS   torsemide   10 mg Oral Daily   Continuous Infusions:   LOS: 0 days    Time spent: 35 minutes    Youcef Klas A Vienne Corcoran, MD [facility]   If 7PM-7AM, please contact night-coverage [email or web address]  02/16/2023, 6:57 AM

## 2023-02-17 DIAGNOSIS — M25552 Pain in left hip: Secondary | ICD-10-CM | POA: Diagnosis not present

## 2023-02-17 MED ORDER — POLYETHYLENE GLYCOL 3350 17 G PO PACK
17.0000 g | PACK | Freq: Every day | ORAL | Status: DC
  Administered 2023-02-17 – 2023-02-21 (×5): 17 g via ORAL
  Filled 2023-02-17 (×5): qty 1

## 2023-02-17 MED ORDER — BISACODYL 10 MG RE SUPP: 10.0000 mg | Freq: Every day | RECTAL | Status: DC | PRN

## 2023-02-17 MED ORDER — BISACODYL 5 MG PO TBEC: 5.0000 mg | DELAYED_RELEASE_TABLET | Freq: Every day | ORAL | Status: DC | PRN

## 2023-02-17 NOTE — Plan of Care (Signed)
  Problem: Education: Goal: Knowledge of General Education information will improve Description: Including pain rating scale, medication(s)/side effects and non-pharmacologic comfort measures Outcome: Not Progressing   Problem: Health Behavior/Discharge Planning: Goal: Ability to manage health-related needs will improve Outcome: Not Progressing   Problem: Clinical Measurements: Goal: Ability to maintain clinical measurements within normal limits will improve Outcome: Progressing Goal: Will remain free from infection Outcome: Progressing Goal: Diagnostic test results will improve Outcome: Progressing Goal: Respiratory complications will improve Outcome: Progressing Goal: Cardiovascular complication will be avoided Outcome: Progressing   Problem: Nutrition: Goal: Adequate nutrition will be maintained Outcome: Progressing   Problem: Coping: Goal: Level of anxiety will decrease Outcome: Progressing   Problem: Elimination: Goal: Will not experience complications related to bowel motility Outcome: Progressing Goal: Will not experience complications related to urinary retention Outcome: Progressing   Problem: Pain Management: Goal: General experience of comfort will improve Outcome: Progressing   Problem: Safety: Goal: Ability to remain free from injury will improve Outcome: Progressing   Problem: Skin Integrity: Goal: Risk for impaired skin integrity will decrease Outcome: Progressing

## 2023-02-17 NOTE — Progress Notes (Addendum)
 PROGRESS NOTE    [name]  FMW:[id number]  DOB: 1940/12/12  DOA: 02/15/2023 PCP: Charlott Dorn LABOR, MD Outpatient Specialists:   Hospital course:  83 year old man with moderate dementia, HTN, HFpEF, CKD 3 AA and H/OL THA was admitted with acute left hip pain after mechanical fall from ground.  Trauma workup revealed nondisplaced right rib fracture although x-ray of hip was negative for fracture.  Subjective:  Patient feels he is doing better although he clearly has significant dementia.  Patient's daughter was at bedside and she states that he had a good night and is comfortable at rest, he is in pain when he tries to move his leg.  She denies any knowledge of his macrocytosis.  Both patient and daughter deny any alcohol  use.   Objective: Vitals:   02/16/23 1402 02/16/23 2208 02/17/23 0541 02/17/23 1312  BP: 116/61 (!) 129/59 (!) 146/67 128/64  Pulse: 81 73 77 (!) 54  Resp: 14 16 16 14   Temp: 98.3 F (36.8 C) 98.9 F (37.2 C) 98.2 F (36.8 C) 97.9 F (36.6 C)  TempSrc:  Oral Oral Oral  SpO2: 92% 92% 92% 93%  Weight:      Height:        Intake/Output Summary (Last 24 hours) at 02/17/2023 1715 Last data filed at 02/17/2023 1428 Gross per 24 hour  Intake 720 ml  Output 900 ml  Net -180 ml   Filed Weights   02/15/23 1637 02/15/23 2245 02/16/23 0447  Weight: 73.3 kg 74.8 kg 74.8 kg     Exam:  General: Pleasant patient with memory difficulties lying in bed in NAD Eyes: sclera anicteric, conjuctiva mild injection bilaterally CVS: S1-S2, regular  Respiratory:  decreased air entry bilaterally secondary to decreased inspiratory effort, rales at bases  GI: NABS, soft, NT  LE: Warm and well-perfused Neuro: Dementia but otherwise nonfocal l.    Data Reviewed:  Basic Metabolic Panel: Recent Labs  Lab 02/15/23 1723 02/15/23 2117 02/16/23 0412  NA 137  --  137  K 4.4  --  4.9  CL 107  --  108  CO2 25  --  23  GLUCOSE 74  --  106*  BUN 32*  --  29*   CREATININE 1.34*  --  1.20  CALCIUM  8.5*  --  8.2*  MG  --  2.4 2.0    CBC: Recent Labs  Lab 02/15/23 1723 02/16/23 0412  WBC 8.6 7.7  NEUTROABS 6.2 5.4  HGB 12.4* 10.7*  HCT 39.9 33.9*  MCV 105.8* 104.6*  PLT 176 156     Scheduled Meds:  acetaminophen   500 mg Oral Q8H   divalproex   250 mg Oral BID   lidocaine   1 patch Transdermal Q24H   memantine   10 mg Oral BID   pantoprazole   40 mg Oral QAC breakfast   pravastatin   40 mg Oral QHS   torsemide   10 mg Oral Daily   Continuous Infusions:   Assessment & Plan:   Fracture of left inferior ramus and pubic body Nondisplaced R 10th rib fracture Ground-level fall CT shows nondisplaced fracture of inferior ramus and pubic body Patient evaluated by PT who are recommending inpatient rehab Pain is well-controlled on present regimen Trauma workup including CT abdomen and pelvis showed no intra-abdominal or intrapelvic traumatic injury  Macrocytic anemia Patient with decrease in hematocrit from 40-34 with stable hemodynamic markers Of note MCV is 105, no history of alcohol  use Will order B12, folate and TSH and follow  HFpEF Compensated, continue home doses of torsemide  Parental lactone is being held, can restart as creatinine continues to improve  CKD 3 AA Improving with management  GERD Continue Protonix   3 cm infrarenal AAA Outpatient follow-up with PCP for monitoring   DVT prophylaxis: Lovenox  Code Status: DNR Family Communication: Attentive daughter was at bedside throughout     Studies: CT FEMUR LEFT WO CONTRAST Result Date: 02/16/2023 CLINICAL DATA:  Thigh pain and limping. EXAM: CT OF THE LOWER LEFT EXTREMITY WITHOUT CONTRAST TECHNIQUE: Multidetector CT imaging of the lower left extremity was performed according to the standard protocol. RADIATION DOSE REDUCTION: This exam was performed according to the departmental dose-optimization program which includes automated exposure control, adjustment of the mA  and/or kV according to patient size and/or use of iterative reconstruction technique. COMPARISON:  Radiographs 02/15/2023 FINDINGS: Bones/Joint/Cartilage Bony demineralization.  Left hip hemiarthroplasty. Deformity left inferior pubic ramus suspicious for fracture. Mild irregularity of the left pubic body possibly also reflecting underlying fracture. Slightly obscured superior ramus primarily related to motion. Ligaments Suboptimally assessed by CT. Muscles and Tendons Unremarkable Soft tissues Retracted testicle on the right along the spermatic cord. IMPRESSION: 1. Deformity of the left inferior pubic ramus suspicious for fracture. Mild irregularity of the left pubic body possibly also reflecting underlying fracture. 2. Bony demineralization. 3. Left hip hemiarthroplasty. 4. Retracted testicle on the right along the spermatic cord. Electronically Signed   By: Ryan Salvage M.D.   On: 02/16/2023 19:14   CT CHEST ABDOMEN PELVIS W CONTRAST Result Date: 02/15/2023 CLINICAL DATA:  Polytrauma, blunt EXAM: CT CHEST, ABDOMEN, AND PELVIS WITH CONTRAST TECHNIQUE: Multidetector CT imaging of the chest, abdomen and pelvis was performed following the standard protocol during bolus administration of intravenous contrast. RADIATION DOSE REDUCTION: This exam was performed according to the departmental dose-optimization program which includes automated exposure control, adjustment of the mA and/or kV according to patient size and/or use of iterative reconstruction technique. CONTRAST:  OMNIPAQUE  IOHEXOL  300 MG/ML  SOLN COMPARISON:  CT abdomen pelvis 02/19/2016 FINDINGS: CHEST: Cardiovascular: No aortic injury. The thoracic aorta is normal in caliber. The heart is normal in size. No significant pericardial effusion. Moderate atherosclerotic plaque. Four-vessel coronary artery calcification. Mediastinum/Nodes: No pneumomediastinum. No mediastinal hematoma. The esophagus is unremarkable. The thyroid  is unremarkable. The  central airways are patent. No mediastinal, hilar, or axillary lymphadenopathy. Lungs/Pleura: Centrilobular emphysematous changes. No focal consolidation. No pulmonary nodule. No pulmonary mass. No pulmonary contusion or laceration. No pneumatocele formation. No pleural effusion. No pneumothorax. No hemothorax. Musculoskeletal/Chest wall: No chest wall mass. Acute nondisplaced right tenth rib fracture (3: 55). No spinal fracture. ABDOMEN / PELVIS: Hepatobiliary: Not enlarged. No focal lesion. No laceration or subcapsular hematoma. Status post cholecystectomy.  No biliary ductal dilatation. Pancreas: Normal pancreatic contour. No main pancreatic duct dilatation. Spleen: Not enlarged. No focal lesion. No laceration, subcapsular hematoma, or vascular injury. Adrenals/Urinary Tract: No nodularity bilaterally. Bilateral kidneys enhance symmetrically. Bilateral nephrolithiasis, measuring up to 4 mm on the right. No hydronephrosis. No contusion, laceration, or subcapsular hematoma. No injury to the vascular structures or collecting systems. No hydroureter. The urinary bladder is unremarkable. On delayed imaging, there is no urothelial wall thickening and there are no filling defects in the opacified portions of the bilateral collecting systems or ureters. Stomach/Bowel: No small or large bowel wall thickening or dilatation. Status post appendectomy. Vasculature/Lymphatics: Aneurysmal infrarenal abdominal aorta measuring up to 3 cm. Severe atherosclerotic plaque. No iliac aneurysm. No active contrast extravasation or pseudoaneurysm. No abdominal, pelvic, inguinal  lymphadenopathy. Reproductive: Normal. Other: No simple free fluid ascites. No pneumoperitoneum. No hemoperitoneum. No mesenteric hematoma identified. No organized fluid collection. Musculoskeletal: No significant soft tissue hematoma. Diffusely decreased bone density. No acute pelvic fracture. Please see separately dictated CT lumbar spine 02/15/2023. Total left  hip arthroplasty partially visualized with no CT evidence of surgical hardware complication. Old healed left hip fracture (3:130). Ports and Devices: None. IMPRESSION: 1. Acute nondisplaced right tenth rib fracture. 2. No acute intrathoracic, intra-abdominal, intrapelvic traumatic injury. 3. No acute fracture or traumatic malalignment of the thoracic spine. 4. Please see separately dictated CT lumbar spine 02/15/2023. Other imaging findings of potential clinical significance: 1. Atherosclerotic plaque of the abdominal aorta. Aortic Atherosclerosis (ICD10-I70.0). 2. Aneurysmal infrarenal abdominal aorta measuring up to 3 cm. Recommend follow-up ultrasound every 3 years. (Ref.: J Vasc Surg. 2018; 67:2-77 and J Am Coll Radiol 2013;10(10):789-794.) Aortic aneurysm NOS (ICD10-I71.9). 3.  Emphysema (ICD10-J43.9). Electronically Signed   By: Morgane  Naveau M.D.   On: 02/15/2023 20:06   CT L-SPINE NO CHARGE Result Date: 02/15/2023 CLINICAL DATA:  144615 Pain 144615 bathroom, foot was tangled in rug resulting in fall, no thinners. Pain in left hip area when trying to weight bear. Pt has dementia. No LOC and did not hit head. Lives with daughter. 130/59-76-95% RA EXAM: CT LUMBAR SPINE WITHOUT CONTRAST TECHNIQUE: Multidetector CT imaging of the lumbar spine was performed without intravenous contrast administration. Multiplanar CT image reconstructions were also generated. RADIATION DOSE REDUCTION: This exam was performed according to the departmental dose-optimization program which includes automated exposure control, adjustment of the mA and/or kV according to patient size and/or use of iterative reconstruction technique. COMPARISON:  CT abdomen pelvis 02/19/2016 FINDINGS: Segmentation: 5 lumbar type vertebrae. Alignment: Normal. Vertebrae: Diffusely decreased bone density. Multilevel mild degenerative changes of the spine. Moderate facet arthropathy at the L5-S1 level. No acute fracture or focal pathologic process.  Paraspinal and other soft tissues: Negative. Disc levels: Mild intervertebral disc space narrowing at the L2-L3 level. Other: Atherosclerotic plaque of the abdominal aorta. Aneurysmal infrarenal abdominal aorta measuring up to 3 cm. Please see separately dictated CT abdomen pelvis 02/15/2023. IMPRESSION: 1. No acute displaced fracture or traumatic listhesis of the lumbar spine. 2. Diffusely decreased bone density. 3. Atherosclerotic plaque of the abdominal aorta. Aortic Atherosclerosis (ICD10-I70.0). 4. Aneurysmal infrarenal abdominal aorta measuring up to 3 cm. Recommend follow-up ultrasound every 3 years. (Ref.: J Vasc Surg. 2018; 67:2-77 and J Am Coll Radiol 2013;10(10):789-794.) Aortic aneurysm NOS (ICD10-I71.9). Electronically Signed   By: Morgane  Naveau M.D.   On: 02/15/2023 19:54   CT HEAD WO CONTRAST ( ) Result Date: 02/15/2023 CLINICAL DATA:  Polytrauma, blunt Trauma fall in bathroom, foot was tangled in rug resulting in fall, no thinners. Pain in left hip area when trying to weight bear. Pt has dementia. No LOC and did not hit head. Lives with daughter. 130/59-76-95% RA EXAM: CT HEAD WITHOUT CONTRAST CT CERVICAL SPINE WITHOUT CONTRAST TECHNIQUE: Multidetector CT imaging of the head and cervical spine was performed following the standard protocol without intravenous contrast. Multiplanar CT image reconstructions of the cervical spine were also generated. RADIATION DOSE REDUCTION: This exam was performed according to the departmental dose-optimization program which includes automated exposure control, adjustment of the mA and/or kV according to patient size and/or use of iterative reconstruction technique. COMPARISON:  CT chest 02/15/2023, CT head C-spine 07/06/2020 FINDINGS: CT HEAD FINDINGS Brain: Cerebral ventricle sizes are concordant with the degree of cerebral volume loss. Patchy and confluent areas of decreased attenuation  are noted throughout the deep and periventricular white matter of the  cerebral hemispheres bilaterally, compatible with chronic microvascular ischemic disease. No evidence of large-territorial acute infarction. No parenchymal hemorrhage. No mass lesion. No extra-axial collection. No mass effect or midline shift. No hydrocephalus. Basilar cisterns are patent. Vascular: No hyperdense vessel. Skull: No acute fracture or focal lesion. Sinuses/Orbits: Paranasal sinuses and mastoid air cells are clear. The orbits are unremarkable. Other: None. CT CERVICAL SPINE FINDINGS Alignment: Normal. Skull base and vertebrae: Multilevel mild moderate degenerative changes spine. No severe osseous neural foraminal or central canal stenosis. No acute fracture. No aggressive appearing focal osseous lesion or focal pathologic process. Soft tissues and spinal canal: No prevertebral fluid or swelling. No visible canal hematoma. Upper chest: Centrilobular emphysematous changes. Other: Atherosclerotic plaque of the carotid arteries within the neck. IMPRESSION: 1. No acute intracranial abnormality. 2. No acute displaced fracture or traumatic listhesis of the cervical spine. 3.  Emphysema (ICD10-J43.9). Electronically Signed   By: Morgane  Naveau M.D.   On: 02/15/2023 19:49   CT Cervical Spine Wo Contrast Result Date: 02/15/2023 CLINICAL DATA:  Polytrauma, blunt Trauma fall in bathroom, foot was tangled in rug resulting in fall, no thinners. Pain in left hip area when trying to weight bear. Pt has dementia. No LOC and did not hit head. Lives with daughter. 130/59-76-95% RA EXAM: CT HEAD WITHOUT CONTRAST CT CERVICAL SPINE WITHOUT CONTRAST TECHNIQUE: Multidetector CT imaging of the head and cervical spine was performed following the standard protocol without intravenous contrast. Multiplanar CT image reconstructions of the cervical spine were also generated. RADIATION DOSE REDUCTION: This exam was performed according to the departmental dose-optimization program which includes automated exposure control,  adjustment of the mA and/or kV according to patient size and/or use of iterative reconstruction technique. COMPARISON:  CT chest 02/15/2023, CT head C-spine 07/06/2020 FINDINGS: CT HEAD FINDINGS Brain: Cerebral ventricle sizes are concordant with the degree of cerebral volume loss. Patchy and confluent areas of decreased attenuation are noted throughout the deep and periventricular white matter of the cerebral hemispheres bilaterally, compatible with chronic microvascular ischemic disease. No evidence of large-territorial acute infarction. No parenchymal hemorrhage. No mass lesion. No extra-axial collection. No mass effect or midline shift. No hydrocephalus. Basilar cisterns are patent. Vascular: No hyperdense vessel. Skull: No acute fracture or focal lesion. Sinuses/Orbits: Paranasal sinuses and mastoid air cells are clear. The orbits are unremarkable. Other: None. CT CERVICAL SPINE FINDINGS Alignment: Normal. Skull base and vertebrae: Multilevel mild moderate degenerative changes spine. No severe osseous neural foraminal or central canal stenosis. No acute fracture. No aggressive appearing focal osseous lesion or focal pathologic process. Soft tissues and spinal canal: No prevertebral fluid or swelling. No visible canal hematoma. Upper chest: Centrilobular emphysematous changes. Other: Atherosclerotic plaque of the carotid arteries within the neck. IMPRESSION: 1. No acute intracranial abnormality. 2. No acute displaced fracture or traumatic listhesis of the cervical spine. 3.  Emphysema (ICD10-J43.9). Electronically Signed   By: Morgane  Naveau M.D.   On: 02/15/2023 19:49   DG Femur Portable Min 2 Views Left Result Date: 02/15/2023 CLINICAL DATA:  Fall; Leg Injury EXAM: LEFT FEMUR PORTABLE 2 VIEWS COMPARISON:  X-ray left hip FINDINGS: Total left hip arthroplasty. At least mild to moderate tibiofemoral compartment joint degenerative changes. There is no evidence of fracture or other focal bone lesions. Soft  tissues are unremarkable. Vascular calcifications. IMPRESSION: Negative for acute traumatic injury. Electronically Signed   By: Morgane  Naveau M.D.   On: 02/15/2023 19:17   DG  Chest 2 View Result Date: 02/15/2023 CLINICAL DATA:  fall EXAM: CHEST - 2 VIEW COMPARISON:  Chest x-ray 09/14/2021, CT chest 09/15/2021 FINDINGS: Patient is slightly rotated making the hilar vascular slightly more prominent than the right. The heart and mediastinal contours are unchanged. Atherosclerotic plaque. No focal consolidation. Chronic coarsened markings with no overt pulmonary edema. No pleural effusion. No pneumothorax. No acute osseous abnormality. IMPRESSION: 1. No active cardiopulmonary disease. 2.  Aortic Atherosclerosis (ICD10-I70.0). Electronically Signed   By: Morgane  Naveau M.D.   On: 02/15/2023 19:16   DG Hip Unilat With Pelvis 2-3 Views Left Result Date: 02/15/2023 CLINICAL DATA:  PAIN, fall EXAM: DG HIP (WITH OR WITHOUT PELVIS) 2-3V LEFT COMPARISON:  None Available. FINDINGS: Total left hip arthroplasty. No radiograph findings to suggest surgical hardware complication. No acute displaced fracture or dislocation of the right hip on frontal view. No acute displaced fracture or diastasis of the bones of the pelvis. There is no evidence of severe arthropathy or other focal bone abnormality. IMPRESSION: 1. Negative for acute traumatic injury. 2. Total left hip arthroplasty. Electronically Signed   By: Morgane  Naveau M.D.   On: 02/15/2023 19:14    Principal Problem:   Acute hip pain, left Active Problems:   HLD (hyperlipidemia)   GERD (gastroesophageal reflux disease)   CKD stage 3a, GFR 45-59 ml/min (HCC)   Right rib fracture   Fall   Infrarenal abdominal aortic aneurysm (AAA) without rupture (HCC)   Chronic diastolic CHF (congestive heart failure) (HCC)     Hyatt Capobianco Tublu Nizhoni [place], [facility]  If 7PM-7AM, please contact night-coverage [email or web address]   LOS: 0 days

## 2023-02-17 NOTE — [place] FL2 (Signed)
 [place]  MEDICAID FL2 LEVEL OF CARE FORM     IDENTIFICATION  Patient Name: [name] Birthdate: Sep 16, 1940 Sex: male Admission Date (Current Location): 02/15/2023  [facility] and [place] Number:  Producer, Television/film/video and Address:  [facility],  [address] [place], [place] [address]      Provider Number: [id number]  Attending Physician Name and Address:  Dana Ivan Masse*  Relative Name and Phone Number:  [name] Dutch (daughter) Ph: [phone]    Current Level of Care: Hospital Recommended Level of Care: Skilled Nursing Facility Prior Approval Number:    Date Approved/Denied:   PASRR Number: [id number]  Discharge Plan: SNF    Current Diagnoses: Patient Active Problem List   Diagnosis Date Noted   Acute hip pain, left 02/15/2023   Right rib fracture 02/15/2023   Fall 02/15/2023   Infrarenal abdominal aortic aneurysm (AAA) without rupture (HCC) 02/15/2023   Chronic diastolic CHF (congestive heart failure) (HCC) 02/15/2023   Heart failure with mildly reduced ejection fraction (HFmrEF) (HCC) 09/15/2021   CAP (community acquired pneumonia) 09/14/2021   Lower extremity edema 09/14/2021   CKD stage 3a, GFR 45-59 ml/min (HCC) 05/27/2021   Degeneration of lumbar intervertebral disc 05/27/2021   Dry eyes 05/27/2021   Loose stools 05/27/2021   Major depression in complete remission (HCC) 05/27/2021   Major depression single episode, in partial remission (HCC) 05/27/2021   Old myocardial infarction 05/27/2021   Pure hypercholesterolemia 05/27/2021   Pyloric channel ulcer 05/27/2021   Testicular hypofunction 05/27/2021   AKI (acute kidney injury) (HCC) 07/08/2020   Thrombocytopenia (HCC) 07/08/2020   Macrocytic anemia 07/08/2020   Dementia (HCC) 07/08/2020   Closed left hip fracture, initial encounter (HCC) 07/06/2020   Memory disorder 11/08/2018   Dysarthria 04/25/2017   Delayed gastric emptying 04/22/2016   Respiratory failure (HCC)  04/16/2016   SOB (shortness of breath)    NSVT (nonsustained ventricular tachycardia) (HCC)    Elevated troponin 04/15/2016   Gastric dysmotility    Gastrojejunostomy tube 02/06/2016 03/10/2016   Hyponatremia    Anxiety and depression    Sleep disturbance    Orthostasis    Leukocytosis    Hyperglycemia    FTT (failure to thrive) in adult    Anemia of chronic disease 01/31/2016   Stage 2 skin ulcer of sacral region (HCC) 01/31/2016   Hypomagnesemia 01/21/2016   Hypokalemia 01/21/2016   Pressure injury of skin 01/20/2016   Pleural effusion    GERD (gastroesophageal reflux disease) 01/19/2016   Insomnia 01/19/2016   Skin ulcer of abdominal wall, limited to breakdown of skin (HCC) 01/17/2016   Dysphagia 01/17/2016   Debility 01/17/2016   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   Vitamin B12 deficiency 01/02/2016   BPH (benign prostatic hyperplasia)    Posterior vitreous detachment    Salzmann's nodular dystrophy of left eye    Hyperopia of both eyes with astigmatism and presbyopia    Central pterygium of right eye    Pressure ulcer of buttock, right, unstageable (HCC) 12/30/2015   Hypernatremia 12/28/2015   Tobacco abuse 12/09/2015   Gastritis 12/09/2015   Protein-calorie malnutrition, severe 12/04/2015   Encephalopathy acute 12/03/2015   Anxiety state 09/28/2015   HLD (hyperlipidemia)    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 09/25/2015 09/25/2015   CAD (coronary artery disease) 04/24/2013   Essential hypertension 04/24/2013   Other and unspecified hyperlipidemia 04/24/2013   Central pterygium 10/30/2012   Far-sighted 10/30/2012   Dystrophy, Salzmann's nodular 10/30/2012  Cataract, nuclear sclerotic senile 10/30/2012    Orientation RESPIRATION BLADDER Height & Weight     Self  Normal Incontinent Weight: 164 lb 14.5 oz (74.8 kg) Height:  6' (182.9 cm)  BEHAVIORAL SYMPTOMS/MOOD NEUROLOGICAL BOWEL NUTRITION STATUS      Continent Diet  (Regular diet)  AMBULATORY STATUS COMMUNICATION OF NEEDS Skin   Extensive Assist Verbally Other (Comment) (Ecchymosis: left arm, bilateral knees, rib)                       Personal Care Assistance Level of Assistance  Bathing, Feeding, Dressing Bathing Assistance: Limited assistance Feeding assistance: Independent Dressing Assistance: Limited assistance     Functional Limitations Info  Sight, Hearing, Speech Sight Info: Impaired Hearing Info: Adequate Speech Info: Adequate    SPECIAL CARE FACTORS FREQUENCY  PT (By licensed PT), OT (By licensed OT)     PT Frequency: 5x's/week OT Frequency: 5x's/week            Contractures Contractures Info: Not present    Additional Factors Info  Code Status, Allergies Code Status Info: DNR Allergies Info: Alfuzosin, Lisinopril, Lorazepam , Tamsulosin Hcl, Uroxatral (Alfuzosin Hcl Er)           Current Medications (02/17/2023):  This is the current hospital active medication list Current Facility-Administered Medications  Medication Dose Route Frequency Provider Last Rate Last Admin   acetaminophen  (TYLENOL ) tablet 650 mg  650 mg Oral Q6H PRN Howerter, Justin B, DO   650 mg at 02/16/23 9471   Or   acetaminophen  (TYLENOL ) suppository 650 mg  650 mg Rectal Q6H PRN Howerter, Justin B, DO       acetaminophen  (TYLENOL ) tablet 500 mg  500 mg Oral Q8H Regalado, Belkys A, MD   500 mg at 02/17/23 0536   albuterol  (PROVENTIL ) (2.5 MG/3ML) 0.083% nebulizer solution 2.5 mg  2.5 mg Nebulization Q4H PRN Howerter, Justin B, DO       divalproex  (DEPAKOTE ) DR tablet 250 mg  250 mg Oral BID Howerter, Justin B, DO   250 mg at 02/17/23 0932   fentaNYL  (SUBLIMAZE ) injection 25 mcg  25 mcg Intravenous Q2H PRN Howerter, Justin B, DO       lidocaine  (LIDODERM ) 5 % 1 patch  1 patch Transdermal Q24H Howerter, Justin B, DO   1 patch at 02/16/23 2001   melatonin tablet 3 mg  3 mg Oral QHS PRN Howerter, Justin B, DO       memantine  (NAMENDA ) tablet 10 mg   10 mg Oral BID Howerter, Justin B, DO   10 mg at 02/17/23 0932   naloxone  (NARCAN ) injection 0.4 mg  0.4 mg Intravenous PRN Howerter, Justin B, DO       ondansetron  (ZOFRAN ) injection 4 mg  4 mg Intravenous Q6H PRN Howerter, Justin B, DO       oxyCODONE -acetaminophen  (PERCOCET/ROXICET) 5-325 MG per tablet 1 tablet  1 tablet Oral Q4H PRN Howerter, Justin B, DO   1 tablet at 02/17/23 0932   pantoprazole  (PROTONIX ) EC tablet 40 mg  40 mg Oral QAC breakfast Howerter, Justin B, DO   40 mg at 02/17/23 0932   pravastatin  (PRAVACHOL ) tablet 40 mg  40 mg Oral QHS Howerter, Justin B, DO   40 mg at 02/16/23 1945   torsemide  (DEMADEX ) tablet 10 mg  10 mg Oral Daily Howerter, Justin B, DO   10 mg at 02/16/23 1004     Discharge Medications: Please see discharge summary for a list of discharge medications.  Relevant Imaging Results:  Relevant Lab Results:   Additional Information SSN: [id number]  Duwaine GORMAN Aran, LCSW

## 2023-02-17 NOTE — Progress Notes (Signed)
 Physical Therapy Treatment Patient Details Name: [name] MRN: [id number] DOB: 1940/04/22 Today's Date: 02/17/2023   History of Present Illness 83 y.o.male who is admitted to [facility] on 02/15/2023 with acute left hip pain after presenting from home to [facility] ED complaining of fall. xrays negative. CT L hip pending.  PMH: dementia, left hip posterior hemiarthroplasty 2022, essential pretension, hyperlipidemia, chronic diastolic heart failure, CKD 3A,. CT of L femur 02/16/2023 indicating possible L inferior pubic rami fx, no orthopedic recommendation for change in LLE WB status, WBAT at this time. Pt also found to have B rib fxs.    PT Comments   Pt admitted with above diagnosis.  Pt currently with functional limitations due to the deficits listed below (see PT Problem List). Pt in bed when PT arrived and daughter present. Pt indicated no pain at rest. Pt pain increased with functional mobility tasks and pt is unable to rate at time of tx session on numeric scale, pt required mod A for supine to sit with increased time cues and use of hospital bed, min A for sit to stand  from elevated EOB with cues and pull to stand, pt required CGA, RW, recliner close,  strong cues for use of B UE support to offload LLE in stance phase and limited gait distance of 5 feet due to reported pain. Pt left seated in recliner, daughter present and all needs in place, nurse is aware pt is requesting pain medication. Pt d/c plan remains appropriate, pt will benefit from continued inpatient follow up therapy, <3 hours/day.  Pt will benefit from acute skilled PT to increase their independence and safety with mobility to allow discharge.      If plan is discharge home, recommend the following: Help with stairs or ramp for entrance;Assistance with cooking/housework;Assist for transportation;A lot of help with walking and/or transfers;A lot of help with bathing/dressing/bathroom   Can travel by private vehicle     No   Equipment Recommendations  None recommended by PT    Recommendations for Other Services       Precautions / Restrictions Precautions Precautions: Fall Restrictions Weight Bearing Restrictions Per Provider Order: No     Mobility  Bed Mobility Overal bed mobility: Needs Assistance Bed Mobility: Supine to Sit     Supine to sit: Mod assist, Used rails, HOB elevated     General bed mobility comments: increased time, multimodal cues and use of hospital bed    Transfers Overall transfer level: Needs assistance Equipment used: Rolling walker (2 wheels) Transfers: Sit to/from Stand Sit to Stand: Min assist, From elevated surface           General transfer comment: cues for pull to stand, initial standing posture    Ambulation/Gait Ambulation/Gait assistance: Contact guard assist Gait Distance (Feet): 5 Feet Assistive device: Rolling walker (2 wheels) Gait Pattern/deviations: Step-to pattern, Decreased stance time - right, Decreased weight shift to left, Shuffle, Antalgic Gait velocity: decreased     General Gait Details: heavy reliance on B UE support with L stance phase, with PT providing cues for technique, limited L foot clearance and pt indicated esclated pain and unable to continue gait training with recliner close   Stairs             Wheelchair Mobility     Tilt Bed    Modified Rankin (Stroke Patients Only)       Balance Overall balance assessment: Needs assistance, History of Falls Sitting-balance support: Feet supported, Bilateral upper  extremity supported Sitting balance-Leahy Scale: Fair Sitting balance - Comments: able to maintain seated balance and attention to midline, slight offloading of L hip Postural control: Posterior lean Standing balance support: Bilateral upper extremity supported, During functional activity, Reliant on assistive device for balance Standing balance-Leahy Scale: Poor Standing balance comment: reliant on UEs                             Cognition Arousal: Alert Behavior During Therapy: WFL for tasks assessed/performed Overall Cognitive Status: History of cognitive impairments - at baseline Area of Impairment: Memory                               General Comments: impaired memory, oriented to person and place, disoriented to time and situation, able to follow 1 step commands consistently (daughter present)        Exercises      General Comments        Pertinent Vitals/Pain Pain Assessment Pain Assessment: Faces Faces Pain Scale: Hurts whole lot Breathing: normal Negative Vocalization: none Facial Expression: smiling or inexpressive Body Language: relaxed Consolability: no need to console PAINAD Score: 0 Pain Location: L LE with activity and WB, back secondary to B rib fx Pain Descriptors / Indicators: Grimacing, Sore, Guarding, Discomfort Pain Intervention(s): Limited activity within patient's tolerance, Monitored during session, Repositioned, Patient requesting pain meds-RN notified    Home Living                          Prior Function            PT Goals (current goals can now be found in the care plan section) Acute Rehab PT Goals Patient Stated Goal: back to being able to move around house PT Goal Formulation: With patient/family Time For Goal Achievement: 03/02/23 Potential to Achieve Goals: Good Progress towards PT goals: Progressing toward goals    Frequency    Min 1X/week      PT Plan      Co-evaluation              AM-PAC PT 6 Clicks Mobility   Outcome Measure  Help needed turning from your back to your side while in a flat bed without using bedrails?: A Lot Help needed moving from lying on your back to sitting on the side of a flat bed without using bedrails?: A Lot Help needed moving to and from a bed to a chair (including a wheelchair)?: A Lot Help needed standing up from a chair using your arms (e.g.,  wheelchair or bedside chair)?: A Little Help needed to walk in hospital room?: A Little Help needed climbing 3-5 steps with a railing? : Total 6 Click Score: 13    End of Session Equipment Utilized During Treatment: Gait belt Activity Tolerance: Patient limited by pain;Patient limited by fatigue Patient left: with call bell/phone within reach;with family/visitor present;in chair;with chair alarm set Nurse Communication: Mobility status;Patient requests pain meds PT Visit Diagnosis: Other abnormalities of gait and mobility (R26.89)     Time: 8754-8691 PT Time Calculation (min) (ACUTE ONLY): 23 min  Charges:    $Gait Training: 8-22 mins $Therapeutic Activity: 8-22 mins PT General Charges $$ ACUTE PT VISIT: 1 Visit                     [place], PT Acute Rehab  [place] VEAR Drone 02/17/2023, 3:05 PM

## 2023-02-17 NOTE — TOC Initial Note (Signed)
 Transition of Care [facility]) - Initial/Assessment Note   Patient Details  Name: [name] MRN: [id number] Date of Birth: Feb 22, 1940  Transition of Care [facility]) CM/SW Contact:    Duwaine GORMAN Aran, LCSW Phone Number: 02/17/2023, 10:16 AM  Clinical Narrative: PT/OT evaluations recommended SNF and daughter is agreeable to patient being referred for short-term rehab. Daughter requested referral not be sent to [facility]. FL2 done; PASRR confirmed. Initial referral faxed out. TOC awaiting bed offers.  Expected Discharge Plan: Skilled Nursing Facility Barriers to Discharge: SNF Pending bed offer, Insurance Authorization  Patient Goals and CMS Choice Patient states their goals for this hospitalization and ongoing recovery are:: Go to rehab CMS Medicare.gov Compare Post Acute Care list provided to:: Patient Represenative (must comment) Deniece Dutch (daughter)) Choice offered to / list presented to : Adult Children  Expected Discharge Plan and Services In-house Referral: Clinical Social Work Post Acute Care Choice: Skilled Nursing Facility Living arrangements for the past 2 months: Single Family Home           DME Arranged: N/A DME Agency: NA  Prior Living Arrangements/Services Living arrangements for the past 2 months: Single Family Home Lives with:: Adult Children Patient language and need for interpreter reviewed:: Yes Do you feel safe going back to the place where you live?: Yes      Need for Family Participation in Patient Care: Yes (Comment) (Patient is oriented to self only.) Care giver support system in place?: Yes (comment) Criminal Activity/Legal Involvement Pertinent to Current Situation/Hospitalization: No - Comment as needed  Activities of Daily Living ADL Screening (condition at time of admission) Independently performs ADLs?: No Does the patient have a NEW difficulty with bathing/dressing/toileting/self-feeding that is expected to last >3 days?: No Does the patient have  a NEW difficulty with getting in/out of bed, walking, or climbing stairs that is expected to last >3 days?: No Does the patient have a NEW difficulty with communication that is expected to last >3 days?: No Is the patient deaf or have difficulty hearing?: No Does the patient have difficulty seeing, even when wearing glasses/contacts?: No Does the patient have difficulty concentrating, remembering, or making decisions?: Yes  Permission Sought/Granted Permission sought to share information with : Facility Industrial/product Designer granted to share information with : Yes, Verbal Permission Granted Permission granted to share info w AGENCY: SNFs  Emotional Assessment Orientation: : Oriented to Self Alcohol  / Substance Use: Not Applicable Psych Involvement: No (comment)  Admission diagnosis:  Contusion of left hip, initial encounter [S70.02XA] Fall, initial encounter [W19.XXXA] Acute hip pain, left [M25.552] Closed fracture of one rib of right side, initial encounter [S22.31XA] Patient Active Problem List   Diagnosis Date Noted   Acute hip pain, left 02/15/2023   Right rib fracture 02/15/2023   Fall 02/15/2023   Infrarenal abdominal aortic aneurysm (AAA) without rupture (HCC) 02/15/2023   Chronic diastolic CHF (congestive heart failure) (HCC) 02/15/2023   Heart failure with mildly reduced ejection fraction (HFmrEF) (HCC) 09/15/2021   CAP (community acquired pneumonia) 09/14/2021   Lower extremity edema 09/14/2021   CKD stage 3a, GFR 45-59 ml/min (HCC) 05/27/2021   Degeneration of lumbar intervertebral disc 05/27/2021   Dry eyes 05/27/2021   Loose stools 05/27/2021   Major depression in complete remission (HCC) 05/27/2021   Major depression single episode, in partial remission (HCC) 05/27/2021   Old myocardial infarction 05/27/2021   Pure hypercholesterolemia 05/27/2021   Pyloric channel ulcer 05/27/2021   Testicular hypofunction 05/27/2021   AKI (acute kidney injury) (  HCC)  07/08/2020   Thrombocytopenia (HCC) 07/08/2020   Macrocytic anemia 07/08/2020   Dementia (HCC) 07/08/2020   Closed left hip fracture, initial encounter (HCC) 07/06/2020   Memory disorder 11/08/2018   Dysarthria 04/25/2017   Delayed gastric emptying 04/22/2016   Respiratory failure (HCC) 04/16/2016   SOB (shortness of breath)    NSVT (nonsustained ventricular tachycardia) (HCC)    Elevated troponin 04/15/2016   Gastric dysmotility    Gastrojejunostomy tube 02/06/2016 03/10/2016   Hyponatremia    Anxiety and depression    Sleep disturbance    Orthostasis    Leukocytosis    Hyperglycemia    FTT (failure to thrive) in adult    Anemia of chronic disease 01/31/2016   Stage 2 skin ulcer of sacral region (HCC) 01/31/2016   Hypomagnesemia 01/21/2016   Hypokalemia 01/21/2016   Pressure injury of skin 01/20/2016   Pleural effusion    GERD (gastroesophageal reflux disease) 01/19/2016   Insomnia 01/19/2016   Skin ulcer of abdominal wall, limited to breakdown of skin (HCC) 01/17/2016   Dysphagia 01/17/2016   Debility 01/17/2016   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   Vitamin B12 deficiency 01/02/2016   BPH (benign prostatic hyperplasia)    Posterior vitreous detachment    Salzmann's nodular dystrophy of left eye    Hyperopia of both eyes with astigmatism and presbyopia    Central pterygium of right eye    Pressure ulcer of buttock, right, unstageable (HCC) 12/30/2015   Hypernatremia 12/28/2015   Tobacco abuse 12/09/2015   Gastritis 12/09/2015   Protein-calorie malnutrition, severe 12/04/2015   Encephalopathy acute 12/03/2015   Anxiety state 09/28/2015   HLD (hyperlipidemia)    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 09/25/2015 09/25/2015   CAD (coronary artery disease) 04/24/2013   Essential hypertension 04/24/2013   Other and unspecified hyperlipidemia 04/24/2013   Central pterygium 10/30/2012   Far-sighted 10/30/2012   Dystrophy, Salzmann's  nodular 10/30/2012   Cataract, nuclear sclerotic senile 10/30/2012   PCP:  Charlott Dorn LABOR, MD Pharmacy:   [id number] - [address] [place] - [address] AT [facility] [address] [place] [address] Phone: [phone] Fax: [phone]  [facility] [place]  - [place] - [address] [place] [address] Phone: [phone] Fax: [phone]  Social Drivers of Health (SDOH) Social History: SDOH Screenings   Food Insecurity: No Food Insecurity (02/15/2023)  Housing: Low Risk  (02/15/2023)  Transportation Needs: No Transportation Needs (02/15/2023)  Utilities: Not At Risk (02/15/2023)  Social Connections: Socially Isolated (02/15/2023)  Tobacco Use: Medium Risk (02/15/2023)   SDOH Interventions:    Readmission Risk Interventions     No data to display

## 2023-02-18 DIAGNOSIS — M25552 Pain in left hip: Secondary | ICD-10-CM | POA: Diagnosis not present

## 2023-02-18 LAB — RETICULOCYTES
Immature Retic Fract: 13.2 % (ref 2.3–15.9)
RBC.: 3.43 MIL/uL — ABNORMAL LOW (ref 4.22–5.81)
Retic Count, Absolute: 65.5 10*3/uL (ref 19.0–186.0)
Retic Ct Pct: 1.9 % (ref 0.4–3.1)

## 2023-02-18 LAB — IRON AND TIBC
Iron: 30 ug/dL — ABNORMAL LOW (ref 45–182)
Saturation Ratios: 11 % — ABNORMAL LOW (ref 17.9–39.5)
TIBC: 270 ug/dL (ref 250–450)
UIBC: 240 ug/dL

## 2023-02-18 LAB — CBC WITH DIFFERENTIAL/PLATELET
Abs Immature Granulocytes: 0.05 10*3/uL (ref 0.00–0.07)
Basophils Absolute: 0.1 10*3/uL (ref 0.0–0.1)
Basophils Relative: 1 %
Eosinophils Absolute: 0.2 10*3/uL (ref 0.0–0.5)
Eosinophils Relative: 4 %
HCT: 35.8 % — ABNORMAL LOW (ref 39.0–52.0)
Hemoglobin: 11.2 g/dL — ABNORMAL LOW (ref 13.0–17.0)
Immature Granulocytes: 1 %
Lymphocytes Relative: 24 %
Lymphs Abs: 1.4 10*3/uL (ref 0.7–4.0)
MCH: 32.8 pg (ref 26.0–34.0)
MCHC: 31.3 g/dL (ref 30.0–36.0)
MCV: 105 fL — ABNORMAL HIGH (ref 80.0–100.0)
Monocytes Absolute: 0.9 10*3/uL (ref 0.1–1.0)
Monocytes Relative: 15 %
Neutro Abs: 3.3 10*3/uL (ref 1.7–7.7)
Neutrophils Relative %: 55 %
Platelets: 147 10*3/uL — ABNORMAL LOW (ref 150–400)
RBC: 3.41 MIL/uL — ABNORMAL LOW (ref 4.22–5.81)
RDW: 12.4 % (ref 11.5–15.5)
WBC: 6 10*3/uL (ref 4.0–10.5)
nRBC: 0 % (ref 0.0–0.2)

## 2023-02-18 LAB — BASIC METABOLIC PANEL
Anion gap: 8 (ref 5–15)
BUN: 35 mg/dL — ABNORMAL HIGH (ref 8–23)
CO2: 25 mmol/L (ref 22–32)
Calcium: 8.1 mg/dL — ABNORMAL LOW (ref 8.9–10.3)
Chloride: 104 mmol/L (ref 98–111)
Creatinine, Ser: 1.46 mg/dL — ABNORMAL HIGH (ref 0.61–1.24)
GFR, Estimated: 47 mL/min — ABNORMAL LOW (ref >60)
Glucose, Bld: 88 mg/dL (ref 70–99)
Potassium: 4.4 mmol/L (ref 3.5–5.1)
Sodium: 137 mmol/L (ref 135–145)

## 2023-02-18 LAB — FERRITIN: Ferritin: 58 ng/mL (ref 24–336)

## 2023-02-18 LAB — VITAMIN B12: Vitamin B-12: 572 pg/mL (ref 180–914)

## 2023-02-18 LAB — TSH: TSH: 6.942 u[IU]/mL — ABNORMAL HIGH (ref 0.350–4.500)

## 2023-02-18 LAB — FOLATE: Folate: 21.8 ng/mL (ref >5.9)

## 2023-02-18 NOTE — Plan of Care (Signed)
  Problem: Activity: Goal: Risk for activity intolerance will decrease Outcome: Progressing   Problem: Coping: Goal: Level of anxiety will decrease Outcome: Progressing   Problem: Pain Management: Goal: General experience of comfort will improve Outcome: Progressing

## 2023-02-18 NOTE — Plan of Care (Signed)
   Problem: Education: Goal: Knowledge of General Education information will improve Description Including pain rating scale, medication(s)/side effects and non-pharmacologic comfort measures Outcome: Progressing

## 2023-02-18 NOTE — Progress Notes (Signed)
 Physical Therapy Treatment Patient Details Name: [name] MRN: [id number] DOB: 01/07/1941 Today's Date: 02/18/2023   History of Present Illness       83 y.o.male who is admitted to [facility] on 02/15/2023 with acute left hip pain after presenting from home complaining of fall. pt with acute nondisplaced right 10th rib fracture. xrays L hip negative. CT of L femur 02/16/2023 indicating possible L inferior pubic rami. PMH: dementia, left hip posterior hemiarthroplasty 2022, essential pretension, hyperlipidemia, chronic diastolic heart failure, CKD 3A,   PT Comments  Pt making steady progress toward goals. Incr gait distance/tolerance today however continues to have some limitations d/t pain.    Pt making steady progress, amb distance and activity tolerance improving although continues to have some limitations d/t pain. D/c plan remains appropriate. Continue PT POC in acute setting     If plan is discharge home, recommend the following: Help with stairs or ramp for entrance;Assistance with cooking/housework;Assist for transportation;A lot of help with walking and/or transfers;A lot of help with bathing/dressing/bathroom   Can travel by private vehicle     No  Equipment Recommendations  None recommended by PT    Recommendations for Other Services       Precautions / Restrictions Precautions Precautions: Fall Restrictions Weight Bearing Restrictions Per Provider Order: No     Mobility  Bed Mobility Overal bed mobility: Needs Assistance Bed Mobility: Supine to Sit     Supine to sit: Mod assist, Used rails, HOB elevated     General bed mobility comments: increased time, multimodal cues for technique and self assist. bed pad used to assist lateral scooting    Transfers Overall transfer level: Needs assistance Equipment used: Rolling walker (2 wheels) Transfers: Sit to/from Stand Sit to Stand: Min assist, From elevated surface           General transfer  comment: STS x2, cues for proper foot position and to push from sitting surface    Ambulation/Gait Ambulation/Gait assistance: Contact guard assist, Min assist Gait Distance (Feet): 15 Feet Assistive device: Rolling walker (2 wheels) Gait Pattern/deviations: Step-to pattern, Decreased stance time - right, Decreased weight shift to left, Antalgic Gait velocity: decreased     General Gait Details: heavy reliance on B UE support with L stance phase, multi-modal  cues for technique, sequence, RW position   Stairs             Wheelchair Mobility     Tilt Bed    Modified Rankin (Stroke Patients Only)       Balance Overall balance assessment: Needs assistance, History of Falls Sitting-balance support: Feet supported, No upper extremity supported Sitting balance-Leahy Scale: Fair     Standing balance support: Bilateral upper extremity supported, During functional activity, Reliant on assistive device for balance Standing balance-Leahy Scale: Poor Standing balance comment: reliant on UEs                            Cognition Arousal: Alert Behavior During Therapy: WFL for tasks assessed/performed Overall Cognitive Status: History of cognitive impairments - at baseline Area of Impairment: Memory                               General Comments: impaired memory, oriented to person and place, able to follow 1 step commands consistently (daughter present)        Exercises General Exercises - Lower Extremity  Ankle Circles/Pumps: AROM, Both, 10 reps Heel Slides: Both, 10 reps, AAROM    General Comments        Pertinent Vitals/Pain Pain Assessment Pain Assessment: Faces Faces Pain Scale: Hurts whole lot Pain Location: L LE with activity and WB, back and ribs Pain Descriptors / Indicators: Grimacing, Sore, Guarding, Discomfort Pain Intervention(s): Monitored during session, Limited activity within patient's tolerance, Premedicated before  session, Repositioned    Home Living                          Prior Function            PT Goals (current goals can now be found in the care plan section) Acute Rehab PT Goals Patient Stated Goal: back to being able to move around house PT Goal Formulation: With patient/family Time For Goal Achievement: 03/02/23 Potential to Achieve Goals: Good Progress towards PT goals: Progressing toward goals    Frequency    Min 1X/week      PT Plan      Co-evaluation              AM-PAC PT 6 Clicks Mobility   Outcome Measure  Help needed turning from your back to your side while in a flat bed without using bedrails?: A Lot Help needed moving from lying on your back to sitting on the side of a flat bed without using bedrails?: A Lot Help needed moving to and from a bed to a chair (including a wheelchair)?: A Little Help needed standing up from a chair using your arms (e.g., wheelchair or bedside chair)?: A Little Help needed to walk in hospital room?: A Little Help needed climbing 3-5 steps with a railing? : Total 6 Click Score: 14    End of Session Equipment Utilized During Treatment: Gait belt Activity Tolerance: Patient tolerated treatment well Patient left: with call bell/phone within reach;with family/visitor present;in chair;with chair alarm set   PT Visit Diagnosis: Other abnormalities of gait and mobility (R26.89)     Time: 8852-8787 PT Time Calculation (min) (ACUTE ONLY): 25 min  Charges:    $Gait Training: 23-37 mins PT General Charges $$ ACUTE PT VISIT: 1 Visit                     Rexene, PT  Acute Rehab Dept [facility]) [phone]  02/18/2023    [facility] 02/18/2023, 12:36 PM

## 2023-02-18 NOTE — Progress Notes (Signed)
 PROGRESS NOTE    [name]  FMW:[id number]  DOB: 1940/03/20  DOA: 02/15/2023 PCP: Charlott Dorn LABOR, MD Outpatient Specialists:   Hospital course:  83 year old man with moderate dementia, HTN, HFpEF, CKD 3 AA and H/OL THA was admitted with acute left hip pain after mechanical fall from ground.  Trauma workup revealed nondisplaced right rib fracture although x-ray of hip was negative for fracture.  Subjective:  Patient was sleeping when I went to see him.  Patient's daughter was at bedside and said he had a good night.  She has no acute concerns.  Discussed macrocytosis and normal B12 and folate levels.  She agrees she can get this worked up as an outpatient if necessary.   Objective: Vitals:   02/17/23 2146 02/18/23 0500 02/18/23 0629 02/18/23 1320  BP: (!) 162/58  121/67 135/76  Pulse: (!) 45  (!) 54 80  Resp: 17  17 16   Temp: (!) 97.5 F (36.4 C)  98.2 F (36.8 C)   TempSrc: Oral  Oral   SpO2: 97%  91% 90%  Weight:  76 kg    Height:        Intake/Output Summary (Last 24 hours) at 02/18/2023 1706 Last data filed at 02/18/2023 0600 Gross per 24 hour  Intake 360 ml  Output 300 ml  Net 60 ml   Filed Weights   02/15/23 2245 02/16/23 0447 02/18/23 0500  Weight: 74.8 kg 74.8 kg 76 kg     Exam:  General: Pleasant patient with memory difficulties lying in bed in NAD Eyes: sclera anicteric, conjuctiva mild injection bilaterally CVS: S1-S2, regular  Respiratory:  decreased air entry bilaterally secondary to decreased inspiratory effort, rales at bases  GI: NABS, soft, NT  LE: Warm and well-perfused Neuro: Dementia but otherwise nonfocal l.    Data Reviewed:  Basic Metabolic Panel: Recent Labs  Lab 02/15/23 1723 02/15/23 2117 02/16/23 0412 02/18/23 0359  NA 137  --  137 137  K 4.4  --  4.9 4.4  CL 107  --  108 104  CO2 25  --  23 25  GLUCOSE 74  --  106* 88  BUN 32*  --  29* 35*  CREATININE 1.34*  --  1.20 1.46*  CALCIUM  8.5*  --  8.2* 8.1*   MG  --  2.4 2.0  --     CBC: Recent Labs  Lab 02/15/23 1723 02/16/23 0412 02/18/23 0359  WBC 8.6 7.7 6.0  NEUTROABS 6.2 5.4 3.3  HGB 12.4* 10.7* 11.2*  HCT 39.9 33.9* 35.8*  MCV 105.8* 104.6* 105.0*  PLT 176 156 147*     Scheduled Meds:  acetaminophen   500 mg Oral Q8H   divalproex   250 mg Oral BID   lidocaine   1 patch Transdermal Q24H   memantine   10 mg Oral BID   pantoprazole   40 mg Oral QAC breakfast   polyethylene glycol  17 g Oral Daily   pravastatin   40 mg Oral QHS   torsemide   10 mg Oral Daily   Continuous Infusions:   Assessment & Plan:   Fracture of left inferior ramus and pubic body Nondisplaced R 10th rib fracture Ground-level fall CT shows nondisplaced fracture of inferior ramus and pubic body Patient evaluated by PT who are recommending inpatient rehab Pain is well-controlled on present regimen Trauma workup including CT abdomen and pelvis showed no intra-abdominal or intrapelvic traumatic injury Patient is awaiting transfer to rehab bed  Macrocytic anemia Hemoglobin is stable on repeat today  MCV remains elevated No history of alcohol  use B12 and folate are WNL TSH minimally elevated at 6.9, do not think this is causing macrocytosis Further workup if warranted can be done as an outpatient, daughter is in agreement  CKD 3B Mild elevation in creatinine however this is his baseline No change in management Encourage oral fluids  HFpEF Compensated, continue home doses of torsemide  Parental lactone is being held, can restart as creatinine continues to improve  CKD 3 AA Improving with management  GERD Continue Protonix   3 cm infrarenal AAA Outpatient follow-up with PCP for monitoring   DVT prophylaxis: Lovenox  Code Status: DNR Family Communication: Attentive daughter was at bedside throughout     Studies: No results found.   Principal Problem:   Acute hip pain, left Active Problems:   HLD (hyperlipidemia)   GERD  (gastroesophageal reflux disease)   CKD stage 3a, GFR 45-59 ml/min (HCC)   Right rib fracture   Fall   Infrarenal abdominal aortic aneurysm (AAA) without rupture (HCC)   Chronic diastolic CHF (congestive heart failure) (HCC)     Keandrea Tapley Tublu Bevelyn Arriola, [facility]  If 7PM-7AM, please contact night-coverage [email or web address]   LOS: 0 days

## 2023-02-19 DIAGNOSIS — S2231XA Fracture of one rib, right side, initial encounter for closed fracture: Secondary | ICD-10-CM | POA: Diagnosis present

## 2023-02-19 DIAGNOSIS — Y92002 Bathroom of unspecified non-institutional (private) residence single-family (private) house as the place of occurrence of the external cause: Secondary | ICD-10-CM | POA: Diagnosis not present

## 2023-02-19 DIAGNOSIS — E871 Hypo-osmolality and hyponatremia: Secondary | ICD-10-CM | POA: Diagnosis not present

## 2023-02-19 DIAGNOSIS — M79604 Pain in right leg: Secondary | ICD-10-CM | POA: Diagnosis present

## 2023-02-19 DIAGNOSIS — S7002XA Contusion of left hip, initial encounter: Secondary | ICD-10-CM | POA: Diagnosis present

## 2023-02-19 DIAGNOSIS — M25552 Pain in left hip: Secondary | ICD-10-CM

## 2023-02-19 DIAGNOSIS — Z888 Allergy status to other drugs, medicaments and biological substances status: Secondary | ICD-10-CM | POA: Diagnosis not present

## 2023-02-19 DIAGNOSIS — I951 Orthostatic hypotension: Secondary | ICD-10-CM | POA: Diagnosis not present

## 2023-02-19 DIAGNOSIS — S2231XD Fracture of one rib, right side, subsequent encounter for fracture with routine healing: Secondary | ICD-10-CM | POA: Diagnosis not present

## 2023-02-19 DIAGNOSIS — N400 Enlarged prostate without lower urinary tract symptoms: Secondary | ICD-10-CM | POA: Diagnosis present

## 2023-02-19 DIAGNOSIS — I130 Hypertensive heart and chronic kidney disease with heart failure and stage 1 through stage 4 chronic kidney disease, or unspecified chronic kidney disease: Secondary | ICD-10-CM | POA: Diagnosis present

## 2023-02-19 DIAGNOSIS — D638 Anemia in other chronic diseases classified elsewhere: Secondary | ICD-10-CM | POA: Diagnosis not present

## 2023-02-19 DIAGNOSIS — R627 Adult failure to thrive: Secondary | ICD-10-CM | POA: Diagnosis present

## 2023-02-19 DIAGNOSIS — I7143 Infrarenal abdominal aortic aneurysm, without rupture: Secondary | ICD-10-CM | POA: Diagnosis not present

## 2023-02-19 DIAGNOSIS — Z743 Need for continuous supervision: Secondary | ICD-10-CM | POA: Diagnosis not present

## 2023-02-19 DIAGNOSIS — M6281 Muscle weakness (generalized): Secondary | ICD-10-CM | POA: Diagnosis not present

## 2023-02-19 DIAGNOSIS — D539 Nutritional anemia, unspecified: Secondary | ICD-10-CM | POA: Diagnosis present

## 2023-02-19 DIAGNOSIS — Z66 Do not resuscitate: Secondary | ICD-10-CM | POA: Diagnosis present

## 2023-02-19 DIAGNOSIS — S32592A Other specified fracture of left pubis, initial encounter for closed fracture: Secondary | ICD-10-CM | POA: Diagnosis present

## 2023-02-19 DIAGNOSIS — K219 Gastro-esophageal reflux disease without esophagitis: Secondary | ICD-10-CM | POA: Diagnosis not present

## 2023-02-19 DIAGNOSIS — Z8249 Family history of ischemic heart disease and other diseases of the circulatory system: Secondary | ICD-10-CM | POA: Diagnosis not present

## 2023-02-19 DIAGNOSIS — R278 Other lack of coordination: Secondary | ICD-10-CM | POA: Diagnosis not present

## 2023-02-19 DIAGNOSIS — J9611 Chronic respiratory failure with hypoxia: Secondary | ICD-10-CM | POA: Diagnosis present

## 2023-02-19 DIAGNOSIS — E870 Hyperosmolality and hypernatremia: Secondary | ICD-10-CM | POA: Diagnosis not present

## 2023-02-19 DIAGNOSIS — E785 Hyperlipidemia, unspecified: Secondary | ICD-10-CM | POA: Diagnosis not present

## 2023-02-19 DIAGNOSIS — I2510 Atherosclerotic heart disease of native coronary artery without angina pectoris: Secondary | ICD-10-CM | POA: Diagnosis present

## 2023-02-19 DIAGNOSIS — R600 Localized edema: Secondary | ICD-10-CM | POA: Diagnosis not present

## 2023-02-19 DIAGNOSIS — G4700 Insomnia, unspecified: Secondary | ICD-10-CM | POA: Diagnosis not present

## 2023-02-19 DIAGNOSIS — R404 Transient alteration of awareness: Secondary | ICD-10-CM | POA: Diagnosis not present

## 2023-02-19 DIAGNOSIS — E538 Deficiency of other specified B group vitamins: Secondary | ICD-10-CM | POA: Diagnosis not present

## 2023-02-19 DIAGNOSIS — N1832 Chronic kidney disease, stage 3b: Secondary | ICD-10-CM | POA: Diagnosis present

## 2023-02-19 DIAGNOSIS — H11021 Central pterygium of right eye: Secondary | ICD-10-CM | POA: Diagnosis present

## 2023-02-19 DIAGNOSIS — F03B0 Unspecified dementia, moderate, without behavioral disturbance, psychotic disturbance, mood disturbance, and anxiety: Secondary | ICD-10-CM | POA: Diagnosis present

## 2023-02-19 DIAGNOSIS — W010XXA Fall on same level from slipping, tripping and stumbling without subsequent striking against object, initial encounter: Secondary | ICD-10-CM | POA: Diagnosis present

## 2023-02-19 DIAGNOSIS — Z96642 Presence of left artificial hip joint: Secondary | ICD-10-CM | POA: Diagnosis present

## 2023-02-19 DIAGNOSIS — Z7401 Bed confinement status: Secondary | ICD-10-CM | POA: Diagnosis not present

## 2023-02-19 DIAGNOSIS — Z87891 Personal history of nicotine dependence: Secondary | ICD-10-CM | POA: Diagnosis not present

## 2023-02-19 DIAGNOSIS — R2689 Other abnormalities of gait and mobility: Secondary | ICD-10-CM | POA: Diagnosis not present

## 2023-02-19 DIAGNOSIS — Z79899 Other long term (current) drug therapy: Secondary | ICD-10-CM | POA: Diagnosis not present

## 2023-02-19 DIAGNOSIS — I5032 Chronic diastolic (congestive) heart failure: Secondary | ICD-10-CM | POA: Diagnosis not present

## 2023-02-19 MED ORDER — SODIUM CHLORIDE 0.9 % IV SOLN: INTRAVENOUS | Status: AC

## 2023-02-19 MED ORDER — SODIUM CHLORIDE 0.9 % IV SOLN: INTRAVENOUS | Status: DC

## 2023-02-19 NOTE — Plan of Care (Signed)

## 2023-02-19 NOTE — Progress Notes (Signed)
 PROGRESS NOTE    [name]  FMW:[id number]  DOB: [birth year]  DOA: 02/15/2023 PCP: Charlott Dorn LABOR, MD Outpatient Specialists:   Hospital course:  83 year old man with moderate dementia, HTN, HFpEF, CKD 3 AA and H/OL THA was admitted with acute left hip pain after mechanical fall from ground.  Trauma workup revealed nondisplaced right rib fracture although x-ray of hip was negative for fracture.  Subjective:  Patient was awake and alert and appears comfortable.  He had no complaints but admits to feeling thirsty.  Patient's daughter at bedside has no new concerns but does note that he does not drink very much water  at all, has drank may be 20 ounces total yesterday.  Notes his urine is quite dark  Objective: Vitals:   02/18/23 2153 02/19/23 0500 02/19/23 0618 02/19/23 1342  BP: 138/78  (!) 124/50 (!) 133/54  Pulse: (!) 57  97 (!) 48  Resp: 15  16 16   Temp: 97.9 F (36.6 C)  97.7 F (36.5 C) 98.4 F (36.9 C)  TempSrc: Oral  Oral Oral  SpO2: 94%  93% 93%  Weight:  77.9 kg    Height:        Intake/Output Summary (Last 24 hours) at 02/19/2023 1550 Last data filed at 02/19/2023 1539 Gross per 24 hour  Intake 383.71 ml  Output 1325 ml  Net -941.29 ml   Filed Weights   02/16/23 0447 02/18/23 0500 02/19/23 0500  Weight: 74.8 kg 76 kg 77.9 kg     Exam:  General: Pleasant patient with memory difficulties lying in bed in NAD, mucous membranes are dry Eyes: sclera anicteric, conjuctiva mild injection bilaterally CVS: S1-S2, regular  Respiratory:  decreased air entry bilaterally secondary to decreased inspiratory effort, rales at bases  GI: NABS, soft, NT  LE: Warm and well-perfused Neuro: Dementia but otherwise nonfocal.   Data Reviewed:  Basic Metabolic Panel: Recent Labs  Lab 02/15/23 1723 02/15/23 2117 02/16/23 0412 02/18/23 0359  NA 137  --  137 137  K 4.4  --  4.9 4.4  CL 107  --  108 104  CO2 25  --  23 25  GLUCOSE 74  --  106* 88  BUN 32*   --  29* 35*  CREATININE 1.34*  --  1.20 1.46*  CALCIUM  8.5*  --  8.2* 8.1*  MG  --  2.4 2.0  --     CBC: Recent Labs  Lab 02/15/23 1723 02/16/23 0412 02/18/23 0359  WBC 8.6 7.7 6.0  NEUTROABS 6.2 5.4 3.3  HGB 12.4* 10.7* 11.2*  HCT 39.9 33.9* 35.8*  MCV 105.8* 104.6* 105.0*  PLT 176 156 147*     Scheduled Meds:  acetaminophen   500 mg Oral Q8H   divalproex   250 mg Oral BID   lidocaine   1 patch Transdermal Q24H   memantine   10 mg Oral BID   pantoprazole   40 mg Oral QAC breakfast   polyethylene glycol  17 g Oral Daily   pravastatin   40 mg Oral QHS   torsemide   10 mg Oral Daily   Continuous Infusions:  sodium chloride  50 mL/hr at 02/19/23 1539     Assessment & Plan:   Fracture of left inferior ramus and pubic body Nondisplaced R 10th rib fracture Ground-level fall CT shows nondisplaced fracture of inferior ramus and pubic body Patient evaluated by PT who are recommending inpatient rehab Pain is well-controlled on present regimen Trauma workup including CT abdomen and pelvis showed no intra-abdominal or intrapelvic  traumatic injury Patient is awaiting transfer to rehab bed  CKD 3B HFpEF Patient with decreased p.o. intake Will hydrate patient gently, NS at 50 x 1 L Hold torsemide  today.  I will restart torsemide  tomorrow given history of decompensated HFpEF in past Would have low threshold for discontinuing torsemide  if patient is not able to maintain p.o. hydration Continue to hold spironolactone   Macrocytic anemia Hemoglobin is stable on repeat MCV remains elevated No history of alcohol  use B12 and folate are WNL TSH minimally elevated at 6.9, do not think this is causing macrocytosis Further workup if warranted can be done as an outpatient, daughter is in agreement  GERD Continue Protonix   3 cm infrarenal AAA Outpatient follow-up with PCP for monitoring if warranted   DVT prophylaxis: Lovenox  Code Status: DNR Family Communication: Attentive  daughter was at bedside throughout     Studies: No results found.   Principal Problem:   Acute hip pain, left Active Problems:   HLD (hyperlipidemia)   GERD (gastroesophageal reflux disease)   CKD stage 3a, GFR 45-59 ml/min (HCC)   Right rib fracture   Fall   Infrarenal abdominal aortic aneurysm (AAA) without rupture (HCC)   Chronic diastolic CHF (congestive heart failure) (HCC)   Failure to thrive in adult     [name], [facility]  If 7PM-7AM, please contact night-coverage [email or web address]   LOS: 0 days

## 2023-02-19 NOTE — Plan of Care (Signed)
   Problem: Education: Goal: Knowledge of General Education information will improve Description: Including pain rating scale, medication(s)/side effects and non-pharmacologic comfort measures Outcome: Progressing   Problem: Activity: Goal: Risk for activity intolerance will decrease Outcome: Progressing   Problem: Coping: Goal: Level of anxiety will decrease Outcome: Progressing

## 2023-02-20 DIAGNOSIS — M25552 Pain in left hip: Secondary | ICD-10-CM | POA: Diagnosis not present

## 2023-02-20 LAB — CBC
HCT: 33.6 % — ABNORMAL LOW (ref 39.0–52.0)
Hemoglobin: 11 g/dL — ABNORMAL LOW (ref 13.0–17.0)
MCH: 33.8 pg (ref 26.0–34.0)
MCHC: 32.7 g/dL (ref 30.0–36.0)
MCV: 103.4 fL — ABNORMAL HIGH (ref 80.0–100.0)
Platelets: 164 10*3/uL (ref 150–400)
RBC: 3.25 MIL/uL — ABNORMAL LOW (ref 4.22–5.81)
RDW: 11.9 % (ref 11.5–15.5)
WBC: 6.2 10*3/uL (ref 4.0–10.5)
nRBC: 0 % (ref 0.0–0.2)

## 2023-02-20 LAB — BASIC METABOLIC PANEL
Anion gap: 8 (ref 5–15)
BUN: 35 mg/dL — ABNORMAL HIGH (ref 8–23)
CO2: 23 mmol/L (ref 22–32)
Calcium: 8 mg/dL — ABNORMAL LOW (ref 8.9–10.3)
Chloride: 108 mmol/L (ref 98–111)
Creatinine, Ser: 1.34 mg/dL — ABNORMAL HIGH (ref 0.61–1.24)
GFR, Estimated: 53 mL/min — ABNORMAL LOW (ref >60)
Glucose, Bld: 88 mg/dL (ref 70–99)
Potassium: 4.4 mmol/L (ref 3.5–5.1)
Sodium: 139 mmol/L (ref 135–145)

## 2023-02-20 MED ORDER — POLYETHYLENE GLYCOL 3350 17 G PO PACK: 17.0000 g | PACK | Freq: Every day | ORAL | Status: DC | PRN

## 2023-02-20 MED ORDER — TORSEMIDE 10 MG PO TABS: 10.0000 mg | ORAL_TABLET | Freq: Every day | ORAL | Status: DC

## 2023-02-20 NOTE — Hospital Course (Addendum)
 83 year old man with moderate dementia, HTN, HFpEF, CKD 3 AA and H/OL THA was admitted with acute left hip pain after mechanical fall from ground. Trauma workup revealed nondisplaced right rib fracture although x-ray of hip was negative for fracture.  Patient remains deconditioned weak and frail, PT OT recommending skilled nursing facility, awaiting on insurance approval-which has been obtained 1/7, patient remains medically stable for discharge, daughter is in agreement.

## 2023-02-20 NOTE — Progress Notes (Signed)
 Physical Therapy Treatment Patient Details Name: [name] MRN: [id number] DOB: Oct 25, 1940 Today's Date: 02/20/2023   History of Present Illness 83 y.o.male who is admitted to [facility] on 02/15/2023 with acute left hip pain after presenting from home to [facility] ED complaining of fall. xrays negative. CT L hip   PMH: dementia, left hip posterior hemiarthroplasty 2022, essential pretension, hyperlipidemia, chronic diastolic heart failure, CKD 3A,    PT Comments  Pt is making steady progress toward goals, incr gait distance today with encouragement. L LE pain and rib pain present but improving with mobility.  D/c plan remains appropriate. Continue PT in acute setting.    If plan is discharge home, recommend the following: Help with stairs or ramp for entrance;Assistance with cooking/housework;Assist for transportation;A lot of help with walking and/or transfers;A lot of help with bathing/dressing/bathroom   Can travel by private vehicle     No  Equipment Recommendations  None recommended by PT    Recommendations for Other Services       Precautions / Restrictions Precautions Precautions: Fall Restrictions Weight Bearing Restrictions Per Provider Order: No     Mobility  Bed Mobility Overal bed mobility: Needs Assistance Bed Mobility: Supine to Sit     Supine to sit: Min assist, HOB elevated     General bed mobility comments: increased time, multimodal cues for technique and self assist. bed pad used to assist lateral scooting, assist to progress LEs off bed, pt able to elevate trunk    Transfers Overall transfer level: Needs assistance Equipment used: Rolling walker (2 wheels) Transfers: Sit to/from Stand Sit to Stand: Min assist, From elevated surface, Contact guard assist           General transfer comment: cues for hand placement and to power up wtih RLE    Ambulation/Gait Ambulation/Gait assistance: Contact guard assist Gait Distance (Feet): 30  Feet Assistive device: Rolling walker (2 wheels) Gait Pattern/deviations: Step-to pattern, Decreased stance time - left, Decreased weight shift to left Gait velocity: decreased     General Gait Details: cues for sequence and RW position, use of UEs to offload LLE for pain control   Stairs             Wheelchair Mobility     Tilt Bed    Modified Rankin (Stroke Patients Only)       Balance Overall balance assessment: Needs assistance, History of Falls Sitting-balance support: Feet supported, No upper extremity supported Sitting balance-Leahy Scale: Fair     Standing balance support: Bilateral upper extremity supported, During functional activity, Reliant on assistive device for balance Standing balance-Leahy Scale: Poor                              Cognition Arousal: Alert Behavior During Therapy: WFL for tasks assessed/performed Overall Cognitive Status: History of cognitive impairments - at baseline Area of Impairment: Memory                     Memory: Decreased short-term memory         General Comments: impaired memory, oriented to person and place, able to follow 1 step commands consistently        Exercises General Exercises - Lower Extremity Ankle Circles/Pumps: AROM, Both, 10 reps Heel Slides: AROM, Both, 5 reps    General Comments        Pertinent Vitals/Pain Pain Assessment Pain Assessment: Faces Faces Pain Scale: Hurts  little more Pain Location: L LE with activity and WB, back and ribs Pain Descriptors / Indicators: Grimacing, Sore, Guarding, Discomfort Pain Intervention(s): Limited activity within patient's tolerance, Monitored during session, Repositioned    Home Living                          Prior Function            PT Goals (current goals can now be found in the care plan section) Acute Rehab PT Goals Patient Stated Goal: back to being able to move around house PT Goal Formulation: With  patient/family Time For Goal Achievement: 03/02/23 Potential to Achieve Goals: Good Progress towards PT goals: Progressing toward goals    Frequency    Min 1X/week      PT Plan      Co-evaluation              AM-PAC PT 6 Clicks Mobility   Outcome Measure  Help needed turning from your back to your side while in a flat bed without using bedrails?: A Little Help needed moving from lying on your back to sitting on the side of a flat bed without using bedrails?: A Little Help needed moving to and from a bed to a chair (including a wheelchair)?: A Little Help needed standing up from a chair using your arms (e.g., wheelchair or bedside chair)?: A Little Help needed to walk in hospital room?: A Little Help needed climbing 3-5 steps with a railing? : A Lot 6 Click Score: 17    End of Session Equipment Utilized During Treatment: Gait belt Activity Tolerance: Patient tolerated treatment well Patient left: with call bell/phone within reach;in chair;with chair alarm set   PT Visit Diagnosis: Other abnormalities of gait and mobility (R26.89)     Time: 8967-8948 PT Time Calculation (min) (ACUTE ONLY): 19 min  Charges:    $Gait Training: 8-22 mins PT General Charges $$ ACUTE PT VISIT: 1 Visit                     Rexene, PT  Acute Rehab Dept [facility]) [phone]  02/20/2023    [facility] 02/20/2023, 10:58 AM

## 2023-02-20 NOTE — Progress Notes (Signed)
 PROGRESS NOTE [name]  FMW:[id number] DOB: 05-06-1940 DOA: 02/15/2023 PCP: Charlott Dorn LABOR, MD  Brief Narrative/Hospital Course: 83 year old man with moderate dementia, HTN, HFpEF, CKD 3 AA and H/OL THA was admitted with acute left hip pain after mechanical fall from ground. Trauma workup revealed nondisplaced right rib fracture although x-ray of hip was negative for fracture.      Subjective: Seen and exam my ined Alert awake oriented to self place, has no complaints resting comfortably No family at bedside Overnight afebrile, BP stable, labs with stable macrocytic anemia.  TSH was 6.9, anemia panel with iron  saturation 11 ferritin 58 normal along with folate and B12 normal level.   Assessment and Plan: Principal Problem:   Acute hip pain, left Active Problems:   HLD (hyperlipidemia)   GERD (gastroesophageal reflux disease)   CKD stage 3a, GFR 45-59 ml/min (HCC)   Right rib fracture   Fall   Infrarenal abdominal aortic aneurysm (AAA) without rupture (HCC)   Chronic diastolic CHF (congestive heart failure) (HCC)   Failure to thrive in adult   Fracture of left inferior ramus and pubic body Nondisplaced Rt 10th rib fracture Ground-level fall: Imaging with CT showed nondisplaced fracture of inferior ramus and pubic body. Patient completed trauma workup including CT abdomen pelvis-no traumatic injury.  Continue pain control PT OT awaiting for inpatient rehab   CKD 3B: Renal function remains stable at baseline.  Encourage oral intake.  Received IV Keppra previously. Recent Labs    08/26/22 1235 09/05/22 1322 09/19/22 1448 02/15/23 1723 02/16/23 0412 02/18/23 0359 02/20/23 0355  BUN 30* 56* 37* 32* 29* 35* 35*  CREATININE 1.53* 1.64* 1.65* 1.34* 1.20 1.46* 1.34*  CO2 22 23 20 25 23 25 23   K 4.8 4.5 4.9 4.4 4.9 4.4 4.4    HFpEF: Euvolemic and compensated.  Continue current torsemide  10 mg daily holding Aldactone  for now.   Macrocytic anemia: Hb remains stable  B12 folate normal TSH slightly elevated follow-up outpatient   GERD On ppi   3 cm infrarenal AAA Outpatient follow-up with PCP for monitoring if warranted  DVT prophylaxis: SCDs Start: 02/15/23 2116 Code Status:   Code Status: Limited: Do not attempt resuscitation (DNR) -DNR-LIMITED -Do Not Intubate/DNI  Family Communication: plan of care discussed with patient at bedside. Patient status is: Remains hospitalized because of severity of illness Level of care: Med-Surg   Dispo: The patient is from: home            Anticipated disposition: Awaiting SNF.  Objective: Vitals last 24 hrs: Vitals:   02/19/23 1342 02/19/23 2034 02/20/23 0500 02/20/23 0546  BP: (!) 133/54 (!) 132/52  126/67  Pulse: (!) 48 74  100  Resp: 16 16  18   Temp: 98.4 F (36.9 C) 98.6 F (37 C)  98.5 F (36.9 C)  TempSrc: Oral Oral  Oral  SpO2: 93% 95%  92%  Weight:   73.6 kg   Height:       Weight change: -4.3 kg  Physical Examination: General exam: alert awake, frail-appearing elderly gentleman.  Not in distress  HEENT:Oral mucosa moist, Ear/Nose WNL grossly Respiratory system: Bilaterally clear BS,no use of accessory muscle Cardiovascular system: S1 & S2 +, No JVD. Gastrointestinal system: Abdomen soft,NT,ND, BS+ Nervous System: Alert, awake, moving all extremities,and following commands. Extremities: LE edema neg,distal peripheral pulses palpable and warm.  Skin: No rashes,no icterus. MSK: Normal muscle bulk,tone, power   Medications reviewed:  Scheduled Meds:  acetaminophen   500 mg Oral Q8H  divalproex   250 mg Oral BID   lidocaine   1 patch Transdermal Q24H   memantine   10 mg Oral BID   pantoprazole   40 mg Oral QAC breakfast   polyethylene glycol  17 g Oral Daily   pravastatin   40 mg Oral QHS   torsemide   10 mg Oral Daily  Continuous Infusions:   Diet Order             Diet regular Room service appropriate? Yes; Fluid consistency: Thin  Diet effective now                   Intake/Output Summary (Last 24 hours) at 02/20/2023 1038 Last data filed at 02/20/2023 0742 Gross per 24 hour  Intake 1043.52 ml  Output 1400 ml  Net -356.48 ml   Net IO Since Admission: -1,861.48 mL [02/20/23 1038]  Wt Readings from Last 3 Encounters:  02/20/23 73.6 kg  09/20/22 72.9 kg  08/26/22 72.2 kg     Unresulted Labs (From admission, onward)    None     Data Reviewed: I have personally reviewed following labs and imaging studies CBC: Recent Labs  Lab 02/15/23 1723 02/16/23 0412 02/18/23 0359 02/20/23 0355  WBC 8.6 7.7 6.0 6.2  NEUTROABS 6.2 5.4 3.3  --   HGB 12.4* 10.7* 11.2* 11.0*  HCT 39.9 33.9* 35.8* 33.6*  MCV 105.8* 104.6* 105.0* 103.4*  PLT 176 156 147* 164   Basic Metabolic Panel:  Recent Labs  Lab 02/15/23 1723 02/15/23 2117 02/16/23 0412 02/18/23 0359 02/20/23 0355  NA 137  --  137 137 139  K 4.4  --  4.9 4.4 4.4  CL 107  --  108 104 108  CO2 25  --  23 25 23   GLUCOSE 74  --  106* 88 88  BUN 32*  --  29* 35* 35*  CREATININE 1.34*  --  1.20 1.46* 1.34*  CALCIUM  8.5*  --  8.2* 8.1* 8.0*  MG  --  2.4 2.0  --   --    GFR: Estimated Creatinine Clearance: 43.5 mL/min (A) (by C-G formula based on SCr of 1.34 mg/dL (H)). Liver Function Tests:  Recent Labs  Lab 02/16/23 0412  AST 18  ALT 15  ALKPHOS 77  BILITOT 0.5  PROT 5.7*  ALBUMIN  2.8*   Recent Labs    02/18/23 0359  TSH 6.942*  Antimicrobials/Microbiology: Anti-infectives (From admission, onward)    None         Component Value Date/Time   SDES URINE, CATHETERIZED 02/28/2016 1036   SPECREQUEST NONE 02/28/2016 1036   CULT NO GROWTH 02/28/2016 1036   REPTSTATUS 02/29/2016 FINAL 02/28/2016 1036     Radiology Studies: No results found.   LOS: 1 day   Total time spent in review of labs and imaging, patient evaluation, formulation of plan, documentation and communication with family: 35 minutes  Mennie LAMY, MD [facility]  02/20/2023, 10:38 AM

## 2023-02-20 NOTE — Plan of Care (Signed)
 Care plan

## 2023-02-20 NOTE — TOC Progression Note (Addendum)
 Transition of Care [facility]) - Progression Note    Patient Details  Name: [name] MRN: [id number] Date of Birth: 10-Oct-1940  Transition of Care [facility]) CM/SW Contact  Alfonse JONELLE Rex, RN Phone Number: 02/20/2023, 1:51 PM  Clinical Narrative:  Call to pt's dtr, [name], to present short term rehab bed offers ([facility], [address], [place], [facility], [facility], [facility]), [name] accepted bed offer at [facility], Brittany, admissions coordinator at [facility] [employer] NCM will initiate auth.    -5:17pm Short term rehab/SNF auth initiated via [employer]. Aith ID [id number], shara pending.      Expected Discharge Plan: Skilled Nursing Facility Barriers to Discharge: SNF Pending bed offer, Insurance Authorization  Expected Discharge Plan and Services In-house Referral: Clinical Social Work   Post Acute Care Choice: Skilled Nursing Facility Living arrangements for the past 2 months: Single Family Home                 DME Arranged: N/A DME Agency: NA                   Social Determinants of Health (SDOH) Interventions SDOH Screenings   Food Insecurity: No Food Insecurity (02/15/2023)  Housing: Low Risk  (02/15/2023)  Transportation Needs: No Transportation Needs (02/15/2023)  Utilities: Not At Risk (02/15/2023)  Social Connections: Socially Isolated (02/15/2023)  Tobacco Use: Medium Risk (02/15/2023)    Readmission Risk Interventions     No data to display

## 2023-02-21 DIAGNOSIS — S2231XD Fracture of one rib, right side, subsequent encounter for fracture with routine healing: Secondary | ICD-10-CM | POA: Diagnosis not present

## 2023-02-21 DIAGNOSIS — E871 Hypo-osmolality and hyponatremia: Secondary | ICD-10-CM | POA: Diagnosis not present

## 2023-02-21 DIAGNOSIS — R600 Localized edema: Secondary | ICD-10-CM | POA: Diagnosis not present

## 2023-02-21 DIAGNOSIS — R2689 Other abnormalities of gait and mobility: Secondary | ICD-10-CM | POA: Diagnosis not present

## 2023-02-21 DIAGNOSIS — E785 Hyperlipidemia, unspecified: Secondary | ICD-10-CM | POA: Diagnosis not present

## 2023-02-21 DIAGNOSIS — R627 Adult failure to thrive: Secondary | ICD-10-CM | POA: Diagnosis not present

## 2023-02-21 DIAGNOSIS — I5032 Chronic diastolic (congestive) heart failure: Secondary | ICD-10-CM | POA: Diagnosis not present

## 2023-02-21 DIAGNOSIS — F5102 Adjustment insomnia: Secondary | ICD-10-CM | POA: Diagnosis not present

## 2023-02-21 DIAGNOSIS — D638 Anemia in other chronic diseases classified elsewhere: Secondary | ICD-10-CM | POA: Diagnosis not present

## 2023-02-21 DIAGNOSIS — M6281 Muscle weakness (generalized): Secondary | ICD-10-CM | POA: Diagnosis not present

## 2023-02-21 DIAGNOSIS — R278 Other lack of coordination: Secondary | ICD-10-CM | POA: Diagnosis not present

## 2023-02-21 DIAGNOSIS — E870 Hyperosmolality and hypernatremia: Secondary | ICD-10-CM | POA: Diagnosis not present

## 2023-02-21 DIAGNOSIS — E538 Deficiency of other specified B group vitamins: Secondary | ICD-10-CM | POA: Diagnosis not present

## 2023-02-21 DIAGNOSIS — Z743 Need for continuous supervision: Secondary | ICD-10-CM | POA: Diagnosis not present

## 2023-02-21 DIAGNOSIS — M25552 Pain in left hip: Secondary | ICD-10-CM | POA: Diagnosis not present

## 2023-02-21 DIAGNOSIS — G4700 Insomnia, unspecified: Secondary | ICD-10-CM | POA: Diagnosis not present

## 2023-02-21 DIAGNOSIS — N1831 Chronic kidney disease, stage 3a: Secondary | ICD-10-CM | POA: Diagnosis not present

## 2023-02-21 DIAGNOSIS — Z7401 Bed confinement status: Secondary | ICD-10-CM | POA: Diagnosis not present

## 2023-02-21 DIAGNOSIS — I7143 Infrarenal abdominal aortic aneurysm, without rupture: Secondary | ICD-10-CM | POA: Diagnosis not present

## 2023-02-21 DIAGNOSIS — R2681 Unsteadiness on feet: Secondary | ICD-10-CM | POA: Diagnosis not present

## 2023-02-21 DIAGNOSIS — I951 Orthostatic hypotension: Secondary | ICD-10-CM | POA: Diagnosis not present

## 2023-02-21 DIAGNOSIS — R404 Transient alteration of awareness: Secondary | ICD-10-CM | POA: Diagnosis not present

## 2023-02-21 DIAGNOSIS — F411 Generalized anxiety disorder: Secondary | ICD-10-CM | POA: Diagnosis not present

## 2023-02-21 DIAGNOSIS — K219 Gastro-esophageal reflux disease without esophagitis: Secondary | ICD-10-CM | POA: Diagnosis not present

## 2023-02-21 DIAGNOSIS — F0390 Unspecified dementia without behavioral disturbance: Secondary | ICD-10-CM | POA: Diagnosis not present

## 2023-02-21 DIAGNOSIS — Z4789 Encounter for other orthopedic aftercare: Secondary | ICD-10-CM | POA: Diagnosis not present

## 2023-02-21 DIAGNOSIS — S32501D Unspecified fracture of right pubis, subsequent encounter for fracture with routine healing: Secondary | ICD-10-CM | POA: Diagnosis not present

## 2023-02-21 NOTE — Plan of Care (Signed)

## 2023-02-21 NOTE — Discharge Summary (Signed)
 Physician Discharge Summary  [name] FMW:[id number] DOB: 24-May-1940 DOA: 02/15/2023  PCP: Charlott Dorn LABOR, MD  Admit date: 02/15/2023 Discharge date: 02/21/2023 Recommendations for Outpatient Follow-up:  Follow up with PCP in 1 weeks-call for appointment Please obtain BMP/CBC in one week  Discharge Dispo: SNF Discharge Condition: Stable Code Status:   Code Status: Limited: Do not attempt resuscitation (DNR) -DNR-LIMITED -Do Not Intubate/DNI  Diet recommendation:  Diet Order             Diet regular Room service appropriate? Yes; Fluid consistency: Thin  Diet effective now                    Brief/Interim Summary: 83 year old man with moderate dementia, HTN, HFpEF, CKD 3 AA and H/OL THA was admitted with acute left hip pain after mechanical fall from ground. Trauma workup revealed nondisplaced right rib fracture although x-ray of hip was negative for fracture.  Patient remains deconditioned weak and frail, PT OT recommending skilled nursing facility, awaiting on insurance approval-which has been obtained 1/7, patient remains medically stable for discharge, daughter is in agreement.    Discharge Diagnoses:  Principal Problem:   Acute hip pain, left Active Problems:   HLD (hyperlipidemia)   GERD (gastroesophageal reflux disease)   CKD stage 3a, GFR 45-59 ml/min (HCC)   Right rib fracture   Fall   Infrarenal abdominal aortic aneurysm (AAA) without rupture (HCC)   Chronic diastolic CHF (congestive heart failure) (HCC)   Failure to thrive in adult  Fracture of left inferior ramus and pubic body Nondisplaced Rt 10th rib fracture Ground-level fall: Imaging with CT showed nondisplaced fracture of inferior ramus and pubic body. Patient completed trauma workup including CT abdomen pelvis-no traumatic injury. Continue pain control PT OT awaiting for inpatient rehab at this time   CKD 3B: Renal function remains stable at baseline.Encouraged oral intake he is s/p  ivf Recent Labs    08/26/22 1235 09/05/22 1322 09/19/22 1448 02/15/23 1723 02/16/23 0412 02/18/23 0359 02/20/23 0355  BUN 30* 56* 37* 32* 29* 35* 35*  CREATININE 1.53* 1.64* 1.65* 1.34* 1.20 1.46* 1.34*  CO2 22 23 20 25 23 25 23   K 4.8 4.5 4.9 4.4 4.9 4.4 4.4    HFpEF: Euvolemic and compensated. Continue current torsemide  10 mg daily, holding Aldactone  for now and hopefully resume upon discharge.   Macrocytic anemia: Hb remains stable B12 folate normal TSH slightly elevated iron  saturation 11 ferritin 58 normal   GERD Continueppi   3 cm infrarenal AAA Outpatient follow-up with PCP for monitoring if warranted  Consults: none Subjective: Patient seen and examined Resting comfortably no nausea vomiting test.  No new complaints Daughter the bedside Overnight afebrile, vitals otherwise stable  Discharge Exam: Vitals:   02/20/23 2302 02/21/23 0520  BP: (!) 157/59 (!) 141/54  Pulse: (!) 56 (!) 51  Resp: 17 16  Temp: 97.8 F (36.6 C) (!) 97.5 F (36.4 C)  SpO2: 96% 98%   General: Pt is alert, awake, not in acute distress Cardiovascular: RRR, S1/S2 +, no rubs, no gallops Respiratory: CTA bilaterally, no wheezing, no rhonchi Abdominal: Soft, NT, ND, bowel sounds + Extremities: no edema, no cyanosis  Discharge Instructions   Allergies as of 02/21/2023       Reactions   Alfuzosin Other (See Comments)   Leg weakness   Lisinopril Cough   Lorazepam  Other (See Comments)   i don't like it  Prefers Xanax  or valium    Tamsulosin Hcl Other (See Comments)  Leg weakness   Uroxatral [alfuzosin Hcl Er] Other (See Comments)   Leg weakness        Medication List     TAKE these medications    bismuth  subsalicylate 262 MG/15ML suspension Commonly known as: PEPTO BISMOL Take 30 mLs by mouth every 6 (six) hours as needed for indigestion.   cholestyramine 4 GM/DOSE powder Commonly known as: QUESTRAN Take 4 g by mouth See admin instructions. Mix 4 grams of powder  into a glass of orange juice and drink every morning   divalproex  125 MG DR tablet Commonly known as: DEPAKOTE  TAKE TWO TABLETS BY MOUTH TWICE DAILY   loperamide  2 MG capsule Commonly known as: IMODIUM  Take 2 mg by mouth daily.   memantine  10 MG tablet Commonly known as: NAMENDA  TAKE ONE TABLET BY MOUTH TWICE DAILY   Multi-Vitamins Tabs Take 1 tablet by mouth daily with breakfast.   pantoprazole  40 MG tablet Commonly known as: PROTONIX  Take 40 mg by mouth daily before breakfast.   polyethylene glycol 17 g packet Commonly known as: MIRALAX  / GLYCOLAX  Take 17 g by mouth daily as needed.   pravastatin  40 MG tablet Commonly known as: PRAVACHOL  Take 40 mg by mouth at bedtime.   sertraline  25 MG tablet Commonly known as: ZOLOFT  Take 25 mg by mouth daily.   spironolactone  25 MG tablet Commonly known as: ALDACTONE  TAKE 1/2 TABLET BY MOUTH ONCE DAILY   torsemide  10 MG tablet Commonly known as: DEMADEX  Take 1 tablet (10 mg total) by mouth daily. What changed:  medication strength how much to take additional instructions   Vitamin D  (Ergocalciferol ) 1.25 MG (50000 UNIT) Caps capsule Commonly known as: DRISDOL  Take 50,000 Units by mouth once a week.        Contact information for follow-up providers     Charlott Dorn LABOR, MD Follow up in 1 week(s).   Specialty: Internal Medicine Contact information: [address] [place] [address] [phone]              Contact information for after-discharge care     Destination     [facility]erred [facility] .   Service: Skilled Nursing Contact information: [address] Test Update Address [place]  [address] [phone]                    Allergies  Allergen Reactions   Alfuzosin Other (See Comments)    Leg weakness    Lisinopril Cough   Lorazepam  Other (See Comments)    i don't like it  Prefers Xanax  or valium    Tamsulosin Hcl Other (See  Comments)    Leg weakness   Uroxatral [Alfuzosin Hcl Er] Other (See Comments)    Leg weakness    The results of significant diagnostics from this hospitalization (including imaging, microbiology, ancillary and laboratory) are listed below for reference.    Microbiology: No results found for this or any previous visit (from the past 240 hours).  Procedures/Studies: CT FEMUR LEFT WO CONTRAST Result Date: 02/16/2023 CLINICAL DATA:  Thigh pain and limping. EXAM: CT OF THE LOWER LEFT EXTREMITY WITHOUT CONTRAST TECHNIQUE: Multidetector CT imaging of the lower left extremity was performed according to the standard protocol. RADIATION DOSE REDUCTION: This exam was performed according to the departmental dose-optimization program which includes automated exposure control, adjustment of the mA and/or kV according to patient size and/or use of iterative reconstruction technique. COMPARISON:  Radiographs 02/15/2023 FINDINGS: Bones/Joint/Cartilage Bony demineralization.  Left hip hemiarthroplasty.  Deformity left inferior pubic ramus suspicious for fracture. Mild irregularity of the left pubic body possibly also reflecting underlying fracture. Slightly obscured superior ramus primarily related to motion. Ligaments Suboptimally assessed by CT. Muscles and Tendons Unremarkable Soft tissues Retracted testicle on the right along the spermatic cord. IMPRESSION: 1. Deformity of the left inferior pubic ramus suspicious for fracture. Mild irregularity of the left pubic body possibly also reflecting underlying fracture. 2. Bony demineralization. 3. Left hip hemiarthroplasty. 4. Retracted testicle on the right along the spermatic cord. Electronically Signed   By: Ryan Salvage M.D.   On: 02/16/2023 19:14   CT CHEST ABDOMEN PELVIS W CONTRAST Result Date: 02/15/2023 CLINICAL DATA:  Polytrauma, blunt EXAM: CT CHEST, ABDOMEN, AND PELVIS WITH CONTRAST TECHNIQUE: Multidetector CT imaging of the chest, abdomen and pelvis was  performed following the standard protocol during bolus administration of intravenous contrast. RADIATION DOSE REDUCTION: This exam was performed according to the departmental dose-optimization program which includes automated exposure control, adjustment of the mA and/or kV according to patient size and/or use of iterative reconstruction technique. CONTRAST:  OMNIPAQUE  IOHEXOL  300 MG/ML  SOLN COMPARISON:  CT abdomen pelvis 02/19/2016 FINDINGS: CHEST: Cardiovascular: No aortic injury. The thoracic aorta is normal in caliber. The heart is normal in size. No significant pericardial effusion. Moderate atherosclerotic plaque. Four-vessel coronary artery calcification. Mediastinum/Nodes: No pneumomediastinum. No mediastinal hematoma. The esophagus is unremarkable. The thyroid  is unremarkable. The central airways are patent. No mediastinal, hilar, or axillary lymphadenopathy. Lungs/Pleura: Centrilobular emphysematous changes. No focal consolidation. No pulmonary nodule. No pulmonary mass. No pulmonary contusion or laceration. No pneumatocele formation. No pleural effusion. No pneumothorax. No hemothorax. Musculoskeletal/Chest wall: No chest wall mass. Acute nondisplaced right tenth rib fracture (3: 55). No spinal fracture. ABDOMEN / PELVIS: Hepatobiliary: Not enlarged. No focal lesion. No laceration or subcapsular hematoma. Status post cholecystectomy.  No biliary ductal dilatation. Pancreas: Normal pancreatic contour. No main pancreatic duct dilatation. Spleen: Not enlarged. No focal lesion. No laceration, subcapsular hematoma, or vascular injury. Adrenals/Urinary Tract: No nodularity bilaterally. Bilateral kidneys enhance symmetrically. Bilateral nephrolithiasis, measuring up to 4 mm on the right. No hydronephrosis. No contusion, laceration, or subcapsular hematoma. No injury to the vascular structures or collecting systems. No hydroureter. The urinary bladder is unremarkable. On delayed imaging, there is no  urothelial wall thickening and there are no filling defects in the opacified portions of the bilateral collecting systems or ureters. Stomach/Bowel: No small or large bowel wall thickening or dilatation. Status post appendectomy. Vasculature/Lymphatics: Aneurysmal infrarenal abdominal aorta measuring up to 3 cm. Severe atherosclerotic plaque. No iliac aneurysm. No active contrast extravasation or pseudoaneurysm. No abdominal, pelvic, inguinal lymphadenopathy. Reproductive: Normal. Other: No simple free fluid ascites. No pneumoperitoneum. No hemoperitoneum. No mesenteric hematoma identified. No organized fluid collection. Musculoskeletal: No significant soft tissue hematoma. Diffusely decreased bone density. No acute pelvic fracture. Please see separately dictated CT lumbar spine 02/15/2023. Total left hip arthroplasty partially visualized with no CT evidence of surgical hardware complication. Old healed left hip fracture (3:130). Ports and Devices: None. IMPRESSION: 1. Acute nondisplaced right tenth rib fracture. 2. No acute intrathoracic, intra-abdominal, intrapelvic traumatic injury. 3. No acute fracture or traumatic malalignment of the thoracic spine. 4. Please see separately dictated CT lumbar spine 02/15/2023. Other imaging findings of potential clinical significance: 1. Atherosclerotic plaque of the abdominal aorta. Aortic Atherosclerosis (ICD10-I70.0). 2. Aneurysmal infrarenal abdominal aorta measuring up to 3 cm. Recommend follow-up ultrasound every 3 years. (Ref.: J Vasc Surg. 2018; 67:2-77 and J Am Coll Radiol 2013;10(10):789-794.)  Aortic aneurysm NOS (ICD10-I71.9). 3.  Emphysema (ICD10-J43.9). Electronically Signed   By: Morgane  Naveau M.D.   On: 02/15/2023 20:06   CT L-SPINE NO CHARGE Result Date: 02/15/2023 CLINICAL DATA:  144615 Pain 144615 bathroom, foot was tangled in rug resulting in fall, no thinners. Pain in left hip area when trying to weight bear. Pt has dementia. No LOC and did not hit head.  Lives with daughter. 130/59-76-95% RA EXAM: CT LUMBAR SPINE WITHOUT CONTRAST TECHNIQUE: Multidetector CT imaging of the lumbar spine was performed without intravenous contrast administration. Multiplanar CT image reconstructions were also generated. RADIATION DOSE REDUCTION: This exam was performed according to the departmental dose-optimization program which includes automated exposure control, adjustment of the mA and/or kV according to patient size and/or use of iterative reconstruction technique. COMPARISON:  CT abdomen pelvis 02/19/2016 FINDINGS: Segmentation: 5 lumbar type vertebrae. Alignment: Normal. Vertebrae: Diffusely decreased bone density. Multilevel mild degenerative changes of the spine. Moderate facet arthropathy at the L5-S1 level. No acute fracture or focal pathologic process. Paraspinal and other soft tissues: Negative. Disc levels: Mild intervertebral disc space narrowing at the L2-L3 level. Other: Atherosclerotic plaque of the abdominal aorta. Aneurysmal infrarenal abdominal aorta measuring up to 3 cm. Please see separately dictated CT abdomen pelvis 02/15/2023. IMPRESSION: 1. No acute displaced fracture or traumatic listhesis of the lumbar spine. 2. Diffusely decreased bone density. 3. Atherosclerotic plaque of the abdominal aorta. Aortic Atherosclerosis (ICD10-I70.0). 4. Aneurysmal infrarenal abdominal aorta measuring up to 3 cm. Recommend follow-up ultrasound every 3 years. (Ref.: J Vasc Surg. 2018; 67:2-77 and J Am Coll Radiol 2013;10(10):789-794.) Aortic aneurysm NOS (ICD10-I71.9). Electronically Signed   By: Morgane  Naveau M.D.   On: 02/15/2023 19:54   CT HEAD WO CONTRAST ( ) Result Date: 02/15/2023 CLINICAL DATA:  Polytrauma, blunt Trauma fall in bathroom, foot was tangled in rug resulting in fall, no thinners. Pain in left hip area when trying to weight bear. Pt has dementia. No LOC and did not hit head. Lives with daughter. 130/59-76-95% RA EXAM: CT HEAD WITHOUT CONTRAST CT  CERVICAL SPINE WITHOUT CONTRAST TECHNIQUE: Multidetector CT imaging of the head and cervical spine was performed following the standard protocol without intravenous contrast. Multiplanar CT image reconstructions of the cervical spine were also generated. RADIATION DOSE REDUCTION: This exam was performed according to the departmental dose-optimization program which includes automated exposure control, adjustment of the mA and/or kV according to patient size and/or use of iterative reconstruction technique. COMPARISON:  CT chest 02/15/2023, CT head C-spine 07/06/2020 FINDINGS: CT HEAD FINDINGS Brain: Cerebral ventricle sizes are concordant with the degree of cerebral volume loss. Patchy and confluent areas of decreased attenuation are noted throughout the deep and periventricular white matter of the cerebral hemispheres bilaterally, compatible with chronic microvascular ischemic disease. No evidence of large-territorial acute infarction. No parenchymal hemorrhage. No mass lesion. No extra-axial collection. No mass effect or midline shift. No hydrocephalus. Basilar cisterns are patent. Vascular: No hyperdense vessel. Skull: No acute fracture or focal lesion. Sinuses/Orbits: Paranasal sinuses and mastoid air cells are clear. The orbits are unremarkable. Other: None. CT CERVICAL SPINE FINDINGS Alignment: Normal. Skull base and vertebrae: Multilevel mild moderate degenerative changes spine. No severe osseous neural foraminal or central canal stenosis. No acute fracture. No aggressive appearing focal osseous lesion or focal pathologic process. Soft tissues and spinal canal: No prevertebral fluid or swelling. No visible canal hematoma. Upper chest: Centrilobular emphysematous changes. Other: Atherosclerotic plaque of the carotid arteries within the neck. IMPRESSION: 1. No acute intracranial abnormality. 2. No  acute displaced fracture or traumatic listhesis of the cervical spine. 3.  Emphysema (ICD10-J43.9). Electronically  Signed   By: Morgane  Naveau M.D.   On: 02/15/2023 19:49   CT Cervical Spine Wo Contrast Result Date: 02/15/2023 CLINICAL DATA:  Polytrauma, blunt Trauma fall in bathroom, foot was tangled in rug resulting in fall, no thinners. Pain in left hip area when trying to weight bear. Pt has dementia. No LOC and did not hit head. Lives with daughter. 130/59-76-95% RA EXAM: CT HEAD WITHOUT CONTRAST CT CERVICAL SPINE WITHOUT CONTRAST TECHNIQUE: Multidetector CT imaging of the head and cervical spine was performed following the standard protocol without intravenous contrast. Multiplanar CT image reconstructions of the cervical spine were also generated. RADIATION DOSE REDUCTION: This exam was performed according to the departmental dose-optimization program which includes automated exposure control, adjustment of the mA and/or kV according to patient size and/or use of iterative reconstruction technique. COMPARISON:  CT chest 02/15/2023, CT head C-spine 07/06/2020 FINDINGS: CT HEAD FINDINGS Brain: Cerebral ventricle sizes are concordant with the degree of cerebral volume loss. Patchy and confluent areas of decreased attenuation are noted throughout the deep and periventricular white matter of the cerebral hemispheres bilaterally, compatible with chronic microvascular ischemic disease. No evidence of large-territorial acute infarction. No parenchymal hemorrhage. No mass lesion. No extra-axial collection. No mass effect or midline shift. No hydrocephalus. Basilar cisterns are patent. Vascular: No hyperdense vessel. Skull: No acute fracture or focal lesion. Sinuses/Orbits: Paranasal sinuses and mastoid air cells are clear. The orbits are unremarkable. Other: None. CT CERVICAL SPINE FINDINGS Alignment: Normal. Skull base and vertebrae: Multilevel mild moderate degenerative changes spine. No severe osseous neural foraminal or central canal stenosis. No acute fracture. No aggressive appearing focal osseous lesion or focal  pathologic process. Soft tissues and spinal canal: No prevertebral fluid or swelling. No visible canal hematoma. Upper chest: Centrilobular emphysematous changes. Other: Atherosclerotic plaque of the carotid arteries within the neck. IMPRESSION: 1. No acute intracranial abnormality. 2. No acute displaced fracture or traumatic listhesis of the cervical spine. 3.  Emphysema (ICD10-J43.9). Electronically Signed   By: Morgane  Naveau M.D.   On: 02/15/2023 19:49   DG Femur Portable Min 2 Views Left Result Date: 02/15/2023 CLINICAL DATA:  Fall; Leg Injury EXAM: LEFT FEMUR PORTABLE 2 VIEWS COMPARISON:  X-ray left hip FINDINGS: Total left hip arthroplasty. At least mild to moderate tibiofemoral compartment joint degenerative changes. There is no evidence of fracture or other focal bone lesions. Soft tissues are unremarkable. Vascular calcifications. IMPRESSION: Negative for acute traumatic injury. Electronically Signed   By: Morgane  Naveau M.D.   On: 02/15/2023 19:17   DG Chest 2 View Result Date: 02/15/2023 CLINICAL DATA:  fall EXAM: CHEST - 2 VIEW COMPARISON:  Chest x-ray 09/14/2021, CT chest 09/15/2021 FINDINGS: Patient is slightly rotated making the hilar vascular slightly more prominent than the right. The heart and mediastinal contours are unchanged. Atherosclerotic plaque. No focal consolidation. Chronic coarsened markings with no overt pulmonary edema. No pleural effusion. No pneumothorax. No acute osseous abnormality. IMPRESSION: 1. No active cardiopulmonary disease. 2.  Aortic Atherosclerosis (ICD10-I70.0). Electronically Signed   By: Morgane  Naveau M.D.   On: 02/15/2023 19:16   DG Hip Unilat With Pelvis 2-3 Views Left Result Date: 02/15/2023 CLINICAL DATA:  PAIN, fall EXAM: DG HIP (WITH OR WITHOUT PELVIS) 2-3V LEFT COMPARISON:  None Available. FINDINGS: Total left hip arthroplasty. No radiograph findings to suggest surgical hardware complication. No acute displaced fracture or dislocation of the right hip  on frontal  view. No acute displaced fracture or diastasis of the bones of the pelvis. There is no evidence of severe arthropathy or other focal bone abnormality. IMPRESSION: 1. Negative for acute traumatic injury. 2. Total left hip arthroplasty. Electronically Signed   By: Morgane  Naveau M.D.   On: 02/15/2023 19:14    Labs: BNP (last 3 results) No results for input(s): BNP in the last 8760 hours. Basic Metabolic Panel: Recent Labs  Lab 02/15/23 1723 02/15/23 2117 02/16/23 0412 02/18/23 0359 02/20/23 0355  NA 137  --  137 137 139  K 4.4  --  4.9 4.4 4.4  CL 107  --  108 104 108  CO2 25  --  23 25 23   GLUCOSE 74  --  106* 88 88  BUN 32*  --  29* 35* 35*  CREATININE 1.34*  --  1.20 1.46* 1.34*  CALCIUM  8.5*  --  8.2* 8.1* 8.0*  MG  --  2.4 2.0  --   --    Liver Function Tests: Recent Labs  Lab 02/16/23 0412  AST 18  ALT 15  ALKPHOS 77  BILITOT 0.5  PROT 5.7*  ALBUMIN  2.8*   No results for input(s): LIPASE, AMYLASE in the last 168 hours. No results for input(s): AMMONIA in the last 168 hours. CBC: Recent Labs  Lab 02/15/23 1723 02/16/23 0412 02/18/23 0359 02/20/23 0355  WBC 8.6 7.7 6.0 6.2  NEUTROABS 6.2 5.4 3.3  --   HGB 12.4* 10.7* 11.2* 11.0*  HCT 39.9 33.9* 35.8* 33.6*  MCV 105.8* 104.6* 105.0* 103.4*  PLT 176 156 147* 164  Anemia work up No results for input(s): VITAMINB12, FOLATE, FERRITIN, TIBC, IRON , RETICCTPCT in the last 72 hours. Urinalysis    Component Value Date/Time   COLORURINE YELLOW 02/16/2023 0111   APPEARANCEUR CLEAR 02/16/2023 0111   LABSPEC 1.035 (H) 02/16/2023 0111   PHURINE 5.0 02/16/2023 0111   GLUCOSEU NEGATIVE 02/16/2023 0111   HGBUR NEGATIVE 02/16/2023 0111   BILIRUBINUR NEGATIVE 02/16/2023 0111   KETONESUR 5 (A) 02/16/2023 0111   PROTEINUR NEGATIVE 02/16/2023 0111   NITRITE NEGATIVE 02/16/2023 0111   LEUKOCYTESUR NEGATIVE 02/16/2023 0111   Sepsis Labs Recent Labs  Lab 02/15/23 1723 02/16/23 0412  02/18/23 0359 02/20/23 0355  WBC 8.6 7.7 6.0 6.2   Microbiology No results found for this or any previous visit (from the past 240 hours).   Time coordinating discharge: 25 minutes  SIGNED: Mennie LAMY, MD  [facility] 02/21/2023, 11:27 AM  If 7PM-7AM, please contact night-coverage [email or web address]

## 2023-02-21 NOTE — TOC Transition Note (Signed)
 Transition of Care [facility]) - Discharge Note   Patient Details  Name: [name] MRN: [id number] Date of Birth: Mar 27, 1940  [facility] CM/SW Contact:  Alfonse JONELLE Rex, RN Phone Number: 02/21/2023, 1:18 PM   Clinical Narrative:  DC order to SNF, Brittany,admissions coordinator at [facility], confirmed bed available today. Pt's dtr ETTER Moats) notified of transfer) [facility] for transport. No further TOC needs identified at this time.      Final next level of care: Skilled Nursing Facility Barriers to Discharge: Barriers Resolved   Patient Goals and CMS Choice Patient states their goals for this hospitalization and ongoing recovery are:: short term rehab CMS Medicare.gov Compare Post Acute Care list provided to:: Patient Represenative (must comment) Jeannene Moats (Daughter)  [phone] ([place])) Choice offered to / list presented to : Adult Children Jeannene Moats (Daughter)  [phone] ([place]))      Discharge Placement              Patient chooses bed at: [facility] Patient to be transferred to facility by: [facility] Name of family member notified: Althia Moats (Daughter)  [phone] ([place]) Patient and family notified of of transfer: 02/21/23  Discharge Plan and Services Additional resources added to the After Visit Summary for   In-house Referral: Clinical Social Work   Post Acute Care Choice: Skilled Nursing Facility          DME Arranged: N/A DME Agency: NA                  Social Drivers of Health (SDOH) Interventions SDOH Screenings   Food Insecurity: No Food Insecurity (02/15/2023)  Housing: Low Risk  (02/15/2023)  Transportation Needs: No Transportation Needs (02/15/2023)  Utilities: Not At Risk (02/15/2023)  Social Connections: Socially Isolated (02/15/2023)  Tobacco Use: Medium Risk (02/15/2023)     Readmission Risk Interventions    02/20/2023    2:49 PM  Readmission Risk Prevention Plan  Transportation Screening Complete  PCP or  Specialist Appt within 5-7 Days Complete  Home Care Screening Complete  Medication Review (RN CM) Complete

## 2023-02-21 NOTE — TOC Progression Note (Signed)
 Transition of Care [facility]) - Progression Note    Patient Details  Name: [name] MRN: [id number] Date of Birth: [birth year]  Transition of Care [facility]) CM/SW Contact  Alfonse JONELLE Rex, RN Phone Number: 02/21/2023, 10:59 AM  Clinical Narrative:   SNF auth approved per [employer] Plan Auth ID [id number], days approved 1/6-02/22/2023, team notified.     Expected Discharge Plan: Skilled Nursing Facility Barriers to Discharge: SNF Pending bed offer, Insurance Authorization  Expected Discharge Plan and Services In-house Referral: Clinical Social Work   Post Acute Care Choice: Skilled Nursing Facility Living arrangements for the past 2 months: Single Family Home                 DME Arranged: N/A DME Agency: NA                   Social Determinants of Health (SDOH) Interventions SDOH Screenings   Food Insecurity: No Food Insecurity (02/15/2023)  Housing: Low Risk  (02/15/2023)  Transportation Needs: No Transportation Needs (02/15/2023)  Utilities: Not At Risk (02/15/2023)  Social Connections: Socially Isolated (02/15/2023)  Tobacco Use: Medium Risk (02/15/2023)    Readmission Risk Interventions    02/20/2023    2:49 PM  Readmission Risk Prevention Plan  Transportation Screening Complete  PCP or Specialist Appt within 5-7 Days Complete  Home Care Screening Complete  Medication Review (RN CM) Complete

## 2023-02-21 NOTE — Progress Notes (Deleted)
 PROGRESS NOTE [name]  FMW:[id number] DOB: 01/24/1941 DOA: 02/15/2023 PCP: Charlott Dorn LABOR, MD  Brief Narrative/Hospital Course: 83 year old man with moderate dementia, HTN, HFpEF, CKD 3 AA and H/OL THA was admitted with acute left hip pain after mechanical fall from ground. Trauma workup revealed nondisplaced right rib fracture although x-ray of hip was negative for fracture.  Patient remains deconditioned weak and frail, PT OT recommending skilled nursing facility, awaiting on insurance approval     Subjective: Patient seen and examined Resting comfortably no nausea vomiting test.  No new complaints Throughout the bedside Overnight afebrile, vitals otherwise stable   Assessment and Plan: Principal Problem:   Acute hip pain, left Active Problems:   HLD (hyperlipidemia)   GERD (gastroesophageal reflux disease)   CKD stage 3a, GFR 45-59 ml/min (HCC)   Right rib fracture   Fall   Infrarenal abdominal aortic aneurysm (AAA) without rupture (HCC)   Chronic diastolic CHF (congestive heart failure) (HCC)   Failure to thrive in adult  Fracture of left inferior ramus and pubic body Nondisplaced Rt 10th rib fracture Ground-level fall: Imaging with CT showed nondisplaced fracture of inferior ramus and pubic body. Patient completed trauma workup including CT abdomen pelvis-no traumatic injury. Continue pain control PT OT awaiting for inpatient rehab at this time   CKD 3B: Renal function remains stable at baseline.Encouraged oral intake he is s/p ivf Recent Labs    08/26/22 1235 09/05/22 1322 09/19/22 1448 02/15/23 1723 02/16/23 0412 02/18/23 0359 02/20/23 0355  BUN 30* 56* 37* 32* 29* 35* 35*  CREATININE 1.53* 1.64* 1.65* 1.34* 1.20 1.46* 1.34*  CO2 22 23 20 25 23 25 23   K 4.8 4.5 4.9 4.4 4.9 4.4 4.4    HFpEF: Euvolemic and compensated. Continue current torsemide  10 mg daily, holding Aldactone  for now and hopefully resume upon discharge.   Macrocytic anemia: Hb  remains stable B12 folate normal TSH slightly elevated iron  saturation 11 ferritin 58 normal   GERD Continueppi   3 cm infrarenal AAA Outpatient follow-up with PCP for monitoring if warranted  DVT prophylaxis: SCDs Start: 02/15/23 2116 Code Status:   Code Status: Limited: Do not attempt resuscitation (DNR) -DNR-LIMITED -Do Not Intubate/DNI  Family Communication: plan of care discussed with patient at bedside. Patient status is: Remains hospitalized because of severity of illness Level of care: Med-Surg   Dispo: The patient is from: home            Anticipated disposition: Awaiting SNF, patient is medically stable.  Objective: Vitals last 24 hrs: Vitals:   02/20/23 1358 02/20/23 2302 02/21/23 0500 02/21/23 0520  BP: (!) 137/58 (!) 157/59  (!) 141/54  Pulse: 62 (!) 56  (!) 51  Resp: 16 17  16   Temp: 98.2 F (36.8 C) 97.8 F (36.6 C)  (!) 97.5 F (36.4 C)  TempSrc: Oral Oral  Oral  SpO2: 93% 96%  98%  Weight:   72.5 kg   Height:       Weight change: -1.1 kg  Physical Examination: General exam: alert awake, frail, elderly.   HEENT:Oral mucosa moist, Ear/Nose WNL grossly Respiratory system: Bilaterally clear BS,no use of accessory muscle Cardiovascular system: S1 & S2 +, No JVD. Gastrointestinal system: Abdomen soft,NT,ND, BS+ Nervous System: Alert, awake, moving all extremities,and following commands. Extremities: LE edema neg,distal peripheral pulses palpable and warm.  Skin: No rashes,no icterus. MSK: Normal muscle bulk,tone, power   Medications reviewed:  Scheduled Meds:  acetaminophen   500 mg Oral Q8H   divalproex   250 mg Oral BID   lidocaine   1 patch Transdermal Q24H   memantine   10 mg Oral BID   pantoprazole   40 mg Oral QAC breakfast   polyethylene glycol  17 g Oral Daily   pravastatin   40 mg Oral QHS   torsemide   10 mg Oral Daily  Continuous Infusions:   Diet Order             Diet regular Room service appropriate? Yes; Fluid consistency: Thin  Diet  effective now                  Intake/Output Summary (Last 24 hours) at 02/21/2023 1032 Last data filed at 02/21/2023 9287 Gross per 24 hour  Intake 180 ml  Output 975 ml  Net -795 ml   Net IO Since Admission: -2,656.48 mL [02/21/23 1032]  Wt Readings from Last 3 Encounters:  02/21/23 72.5 kg  09/20/22 72.9 kg  08/26/22 72.2 kg     Unresulted Labs (From admission, onward)    None     Data Reviewed: I have personally reviewed following labs and imaging studies CBC: Recent Labs  Lab 02/15/23 1723 02/16/23 0412 02/18/23 0359 02/20/23 0355  WBC 8.6 7.7 6.0 6.2  NEUTROABS 6.2 5.4 3.3  --   HGB 12.4* 10.7* 11.2* 11.0*  HCT 39.9 33.9* 35.8* 33.6*  MCV 105.8* 104.6* 105.0* 103.4*  PLT 176 156 147* 164   Basic Metabolic Panel:  Recent Labs  Lab 02/15/23 1723 02/15/23 2117 02/16/23 0412 02/18/23 0359 02/20/23 0355  NA 137  --  137 137 139  K 4.4  --  4.9 4.4 4.4  CL 107  --  108 104 108  CO2 25  --  23 25 23   GLUCOSE 74  --  106* 88 88  BUN 32*  --  29* 35* 35*  CREATININE 1.34*  --  1.20 1.46* 1.34*  CALCIUM  8.5*  --  8.2* 8.1* 8.0*  MG  --  2.4 2.0  --   --    GFR: Estimated Creatinine Clearance: 42.8 mL/min (A) (by C-G formula based on SCr of 1.34 mg/dL (H)). Liver Function Tests:  Recent Labs  Lab 02/16/23 0412  AST 18  ALT 15  ALKPHOS 77  BILITOT 0.5  PROT 5.7*  ALBUMIN  2.8*   No results for input(s): TSH, T4TOTAL, FREET4, T3FREE, THYROIDAB in the last 72 hours. Antimicrobials/Microbiology: Anti-infectives (From admission, onward)    None         Component Value Date/Time   SDES URINE, CATHETERIZED 02/28/2016 1036   SPECREQUEST NONE 02/28/2016 1036   CULT NO GROWTH 02/28/2016 1036   REPTSTATUS 02/29/2016 FINAL 02/28/2016 1036     Radiology Studies: No results found.   LOS: 2 days   Total time spent in review of labs and imaging, patient evaluation, formulation of plan, documentation and communication with family: 25  minutes  Mennie LAMY, MD [facility]  02/21/2023, 10:32 AM

## 2023-02-21 NOTE — Progress Notes (Signed)
 Attempted to call report to [facility]. No answer. Voicemail left for call back number.

## 2023-02-22 DIAGNOSIS — K219 Gastro-esophageal reflux disease without esophagitis: Secondary | ICD-10-CM | POA: Diagnosis not present

## 2023-02-22 DIAGNOSIS — E785 Hyperlipidemia, unspecified: Secondary | ICD-10-CM | POA: Diagnosis not present

## 2023-02-22 DIAGNOSIS — D638 Anemia in other chronic diseases classified elsewhere: Secondary | ICD-10-CM | POA: Diagnosis not present

## 2023-02-22 DIAGNOSIS — I5032 Chronic diastolic (congestive) heart failure: Secondary | ICD-10-CM | POA: Diagnosis not present

## 2023-02-23 DIAGNOSIS — N1831 Chronic kidney disease, stage 3a: Secondary | ICD-10-CM | POA: Diagnosis not present

## 2023-02-23 DIAGNOSIS — S32501D Unspecified fracture of right pubis, subsequent encounter for fracture with routine healing: Secondary | ICD-10-CM | POA: Diagnosis not present

## 2023-02-23 DIAGNOSIS — F0390 Unspecified dementia without behavioral disturbance: Secondary | ICD-10-CM | POA: Diagnosis not present

## 2023-02-23 DIAGNOSIS — M25552 Pain in left hip: Secondary | ICD-10-CM | POA: Diagnosis not present

## 2023-03-07 DIAGNOSIS — F411 Generalized anxiety disorder: Secondary | ICD-10-CM | POA: Diagnosis not present

## 2023-03-07 DIAGNOSIS — R627 Adult failure to thrive: Secondary | ICD-10-CM

## 2023-03-07 DIAGNOSIS — I5032 Chronic diastolic (congestive) heart failure: Secondary | ICD-10-CM

## 2023-03-07 DIAGNOSIS — R2681 Unsteadiness on feet: Secondary | ICD-10-CM

## 2023-03-07 DIAGNOSIS — F5102 Adjustment insomnia: Secondary | ICD-10-CM

## 2023-03-07 DIAGNOSIS — M6281 Muscle weakness (generalized): Secondary | ICD-10-CM

## 2023-03-07 DIAGNOSIS — Z4789 Encounter for other orthopedic aftercare: Secondary | ICD-10-CM

## 2023-03-11 DIAGNOSIS — N1831 Chronic kidney disease, stage 3a: Secondary | ICD-10-CM | POA: Diagnosis not present

## 2023-03-11 DIAGNOSIS — I130 Hypertensive heart and chronic kidney disease with heart failure and stage 1 through stage 4 chronic kidney disease, or unspecified chronic kidney disease: Secondary | ICD-10-CM | POA: Diagnosis not present

## 2023-03-11 DIAGNOSIS — I5032 Chronic diastolic (congestive) heart failure: Secondary | ICD-10-CM | POA: Diagnosis not present

## 2023-03-11 DIAGNOSIS — Z556 Problems related to health literacy: Secondary | ICD-10-CM | POA: Diagnosis not present

## 2023-03-11 DIAGNOSIS — K219 Gastro-esophageal reflux disease without esophagitis: Secondary | ICD-10-CM | POA: Diagnosis not present

## 2023-03-11 DIAGNOSIS — E785 Hyperlipidemia, unspecified: Secondary | ICD-10-CM | POA: Diagnosis not present

## 2023-03-11 DIAGNOSIS — S2231XD Fracture of one rib, right side, subsequent encounter for fracture with routine healing: Secondary | ICD-10-CM | POA: Diagnosis not present

## 2023-03-11 DIAGNOSIS — S32592D Other specified fracture of left pubis, subsequent encounter for fracture with routine healing: Secondary | ICD-10-CM | POA: Diagnosis not present

## 2023-03-11 DIAGNOSIS — I7143 Infrarenal abdominal aortic aneurysm, without rupture: Secondary | ICD-10-CM | POA: Diagnosis not present

## 2023-03-11 DIAGNOSIS — Z9181 History of falling: Secondary | ICD-10-CM | POA: Diagnosis not present

## 2023-03-11 DIAGNOSIS — D539 Nutritional anemia, unspecified: Secondary | ICD-10-CM | POA: Diagnosis not present

## 2023-03-13 DIAGNOSIS — S32592D Other specified fracture of left pubis, subsequent encounter for fracture with routine healing: Secondary | ICD-10-CM | POA: Diagnosis not present

## 2023-03-13 DIAGNOSIS — Z9181 History of falling: Secondary | ICD-10-CM | POA: Diagnosis not present

## 2023-03-13 DIAGNOSIS — D539 Nutritional anemia, unspecified: Secondary | ICD-10-CM | POA: Diagnosis not present

## 2023-03-13 DIAGNOSIS — K219 Gastro-esophageal reflux disease without esophagitis: Secondary | ICD-10-CM | POA: Diagnosis not present

## 2023-03-13 DIAGNOSIS — S2231XD Fracture of one rib, right side, subsequent encounter for fracture with routine healing: Secondary | ICD-10-CM | POA: Diagnosis not present

## 2023-03-13 DIAGNOSIS — I130 Hypertensive heart and chronic kidney disease with heart failure and stage 1 through stage 4 chronic kidney disease, or unspecified chronic kidney disease: Secondary | ICD-10-CM | POA: Diagnosis not present

## 2023-03-13 DIAGNOSIS — Z556 Problems related to health literacy: Secondary | ICD-10-CM | POA: Diagnosis not present

## 2023-03-13 DIAGNOSIS — I7143 Infrarenal abdominal aortic aneurysm, without rupture: Secondary | ICD-10-CM | POA: Diagnosis not present

## 2023-03-13 DIAGNOSIS — E785 Hyperlipidemia, unspecified: Secondary | ICD-10-CM | POA: Diagnosis not present

## 2023-03-13 DIAGNOSIS — I5032 Chronic diastolic (congestive) heart failure: Secondary | ICD-10-CM | POA: Diagnosis not present

## 2023-03-13 DIAGNOSIS — N1831 Chronic kidney disease, stage 3a: Secondary | ICD-10-CM | POA: Diagnosis not present

## 2023-03-15 DIAGNOSIS — S2231XD Fracture of one rib, right side, subsequent encounter for fracture with routine healing: Secondary | ICD-10-CM | POA: Diagnosis not present

## 2023-03-15 DIAGNOSIS — Z9181 History of falling: Secondary | ICD-10-CM | POA: Diagnosis not present

## 2023-03-15 DIAGNOSIS — D539 Nutritional anemia, unspecified: Secondary | ICD-10-CM | POA: Diagnosis not present

## 2023-03-15 DIAGNOSIS — E785 Hyperlipidemia, unspecified: Secondary | ICD-10-CM | POA: Diagnosis not present

## 2023-03-15 DIAGNOSIS — K219 Gastro-esophageal reflux disease without esophagitis: Secondary | ICD-10-CM | POA: Diagnosis not present

## 2023-03-15 DIAGNOSIS — Z556 Problems related to health literacy: Secondary | ICD-10-CM | POA: Diagnosis not present

## 2023-03-15 DIAGNOSIS — N1831 Chronic kidney disease, stage 3a: Secondary | ICD-10-CM | POA: Diagnosis not present

## 2023-03-15 DIAGNOSIS — I5032 Chronic diastolic (congestive) heart failure: Secondary | ICD-10-CM | POA: Diagnosis not present

## 2023-03-15 DIAGNOSIS — I130 Hypertensive heart and chronic kidney disease with heart failure and stage 1 through stage 4 chronic kidney disease, or unspecified chronic kidney disease: Secondary | ICD-10-CM | POA: Diagnosis not present

## 2023-03-15 DIAGNOSIS — S32592D Other specified fracture of left pubis, subsequent encounter for fracture with routine healing: Secondary | ICD-10-CM | POA: Diagnosis not present

## 2023-03-15 DIAGNOSIS — I7143 Infrarenal abdominal aortic aneurysm, without rupture: Secondary | ICD-10-CM | POA: Diagnosis not present

## 2023-03-16 DIAGNOSIS — I130 Hypertensive heart and chronic kidney disease with heart failure and stage 1 through stage 4 chronic kidney disease, or unspecified chronic kidney disease: Secondary | ICD-10-CM | POA: Diagnosis not present

## 2023-03-16 DIAGNOSIS — K219 Gastro-esophageal reflux disease without esophagitis: Secondary | ICD-10-CM | POA: Diagnosis not present

## 2023-03-16 DIAGNOSIS — D539 Nutritional anemia, unspecified: Secondary | ICD-10-CM | POA: Diagnosis not present

## 2023-03-16 DIAGNOSIS — I7143 Infrarenal abdominal aortic aneurysm, without rupture: Secondary | ICD-10-CM | POA: Diagnosis not present

## 2023-03-16 DIAGNOSIS — E785 Hyperlipidemia, unspecified: Secondary | ICD-10-CM | POA: Diagnosis not present

## 2023-03-16 DIAGNOSIS — S32592D Other specified fracture of left pubis, subsequent encounter for fracture with routine healing: Secondary | ICD-10-CM | POA: Diagnosis not present

## 2023-03-16 DIAGNOSIS — Z556 Problems related to health literacy: Secondary | ICD-10-CM | POA: Diagnosis not present

## 2023-03-16 DIAGNOSIS — Z9181 History of falling: Secondary | ICD-10-CM | POA: Diagnosis not present

## 2023-03-16 DIAGNOSIS — N1831 Chronic kidney disease, stage 3a: Secondary | ICD-10-CM | POA: Diagnosis not present

## 2023-03-16 DIAGNOSIS — I5032 Chronic diastolic (congestive) heart failure: Secondary | ICD-10-CM | POA: Diagnosis not present

## 2023-03-16 DIAGNOSIS — S2231XD Fracture of one rib, right side, subsequent encounter for fracture with routine healing: Secondary | ICD-10-CM | POA: Diagnosis not present

## 2023-03-17 DIAGNOSIS — N1831 Chronic kidney disease, stage 3a: Secondary | ICD-10-CM | POA: Diagnosis not present

## 2023-03-17 DIAGNOSIS — D539 Nutritional anemia, unspecified: Secondary | ICD-10-CM | POA: Diagnosis not present

## 2023-03-17 DIAGNOSIS — S2231XD Fracture of one rib, right side, subsequent encounter for fracture with routine healing: Secondary | ICD-10-CM | POA: Diagnosis not present

## 2023-03-17 DIAGNOSIS — S32592D Other specified fracture of left pubis, subsequent encounter for fracture with routine healing: Secondary | ICD-10-CM | POA: Diagnosis not present

## 2023-03-17 DIAGNOSIS — E785 Hyperlipidemia, unspecified: Secondary | ICD-10-CM | POA: Diagnosis not present

## 2023-03-17 DIAGNOSIS — I130 Hypertensive heart and chronic kidney disease with heart failure and stage 1 through stage 4 chronic kidney disease, or unspecified chronic kidney disease: Secondary | ICD-10-CM | POA: Diagnosis not present

## 2023-03-17 DIAGNOSIS — I7143 Infrarenal abdominal aortic aneurysm, without rupture: Secondary | ICD-10-CM | POA: Diagnosis not present

## 2023-03-17 DIAGNOSIS — Z556 Problems related to health literacy: Secondary | ICD-10-CM | POA: Diagnosis not present

## 2023-03-17 DIAGNOSIS — K219 Gastro-esophageal reflux disease without esophagitis: Secondary | ICD-10-CM | POA: Diagnosis not present

## 2023-03-17 DIAGNOSIS — I5032 Chronic diastolic (congestive) heart failure: Secondary | ICD-10-CM | POA: Diagnosis not present

## 2023-03-17 DIAGNOSIS — Z9181 History of falling: Secondary | ICD-10-CM | POA: Diagnosis not present

## 2023-03-20 DIAGNOSIS — Z23 Encounter for immunization: Secondary | ICD-10-CM | POA: Diagnosis not present

## 2023-03-20 DIAGNOSIS — E7800 Pure hypercholesterolemia, unspecified: Secondary | ICD-10-CM | POA: Diagnosis not present

## 2023-03-20 DIAGNOSIS — I5040 Unspecified combined systolic (congestive) and diastolic (congestive) heart failure: Secondary | ICD-10-CM | POA: Diagnosis not present

## 2023-03-20 DIAGNOSIS — K219 Gastro-esophageal reflux disease without esophagitis: Secondary | ICD-10-CM | POA: Diagnosis not present

## 2023-03-20 DIAGNOSIS — I77810 Thoracic aortic ectasia: Secondary | ICD-10-CM | POA: Diagnosis not present

## 2023-03-20 DIAGNOSIS — I2510 Atherosclerotic heart disease of native coronary artery without angina pectoris: Secondary | ICD-10-CM | POA: Diagnosis not present

## 2023-03-20 DIAGNOSIS — R600 Localized edema: Secondary | ICD-10-CM | POA: Diagnosis not present

## 2023-03-20 DIAGNOSIS — R350 Frequency of micturition: Secondary | ICD-10-CM | POA: Diagnosis not present

## 2023-03-20 DIAGNOSIS — N1831 Chronic kidney disease, stage 3a: Secondary | ICD-10-CM | POA: Diagnosis not present

## 2023-03-20 DIAGNOSIS — I10 Essential (primary) hypertension: Secondary | ICD-10-CM | POA: Diagnosis not present

## 2023-03-20 DIAGNOSIS — K9089 Other intestinal malabsorption: Secondary | ICD-10-CM | POA: Diagnosis not present

## 2023-03-21 DIAGNOSIS — S2231XD Fracture of one rib, right side, subsequent encounter for fracture with routine healing: Secondary | ICD-10-CM | POA: Diagnosis not present

## 2023-03-21 DIAGNOSIS — N1831 Chronic kidney disease, stage 3a: Secondary | ICD-10-CM | POA: Diagnosis not present

## 2023-03-21 DIAGNOSIS — K219 Gastro-esophageal reflux disease without esophagitis: Secondary | ICD-10-CM | POA: Diagnosis not present

## 2023-03-21 DIAGNOSIS — I5032 Chronic diastolic (congestive) heart failure: Secondary | ICD-10-CM | POA: Diagnosis not present

## 2023-03-21 DIAGNOSIS — D539 Nutritional anemia, unspecified: Secondary | ICD-10-CM | POA: Diagnosis not present

## 2023-03-21 DIAGNOSIS — I130 Hypertensive heart and chronic kidney disease with heart failure and stage 1 through stage 4 chronic kidney disease, or unspecified chronic kidney disease: Secondary | ICD-10-CM | POA: Diagnosis not present

## 2023-03-21 DIAGNOSIS — Z556 Problems related to health literacy: Secondary | ICD-10-CM | POA: Diagnosis not present

## 2023-03-21 DIAGNOSIS — I7143 Infrarenal abdominal aortic aneurysm, without rupture: Secondary | ICD-10-CM | POA: Diagnosis not present

## 2023-03-21 DIAGNOSIS — E785 Hyperlipidemia, unspecified: Secondary | ICD-10-CM | POA: Diagnosis not present

## 2023-03-21 DIAGNOSIS — Z9181 History of falling: Secondary | ICD-10-CM | POA: Diagnosis not present

## 2023-03-21 DIAGNOSIS — S32592D Other specified fracture of left pubis, subsequent encounter for fracture with routine healing: Secondary | ICD-10-CM | POA: Diagnosis not present

## 2023-03-23 DIAGNOSIS — S32592D Other specified fracture of left pubis, subsequent encounter for fracture with routine healing: Secondary | ICD-10-CM | POA: Diagnosis not present

## 2023-03-23 DIAGNOSIS — Z9181 History of falling: Secondary | ICD-10-CM | POA: Diagnosis not present

## 2023-03-23 DIAGNOSIS — I5032 Chronic diastolic (congestive) heart failure: Secondary | ICD-10-CM | POA: Diagnosis not present

## 2023-03-23 DIAGNOSIS — N1831 Chronic kidney disease, stage 3a: Secondary | ICD-10-CM | POA: Diagnosis not present

## 2023-03-23 DIAGNOSIS — K219 Gastro-esophageal reflux disease without esophagitis: Secondary | ICD-10-CM | POA: Diagnosis not present

## 2023-03-23 DIAGNOSIS — D539 Nutritional anemia, unspecified: Secondary | ICD-10-CM | POA: Diagnosis not present

## 2023-03-23 DIAGNOSIS — I7143 Infrarenal abdominal aortic aneurysm, without rupture: Secondary | ICD-10-CM | POA: Diagnosis not present

## 2023-03-23 DIAGNOSIS — S2231XD Fracture of one rib, right side, subsequent encounter for fracture with routine healing: Secondary | ICD-10-CM | POA: Diagnosis not present

## 2023-03-23 DIAGNOSIS — E785 Hyperlipidemia, unspecified: Secondary | ICD-10-CM | POA: Diagnosis not present

## 2023-03-23 DIAGNOSIS — I130 Hypertensive heart and chronic kidney disease with heart failure and stage 1 through stage 4 chronic kidney disease, or unspecified chronic kidney disease: Secondary | ICD-10-CM | POA: Diagnosis not present

## 2023-03-23 DIAGNOSIS — Z556 Problems related to health literacy: Secondary | ICD-10-CM | POA: Diagnosis not present

## 2023-03-25 DIAGNOSIS — Z9181 History of falling: Secondary | ICD-10-CM | POA: Diagnosis not present

## 2023-03-25 DIAGNOSIS — I130 Hypertensive heart and chronic kidney disease with heart failure and stage 1 through stage 4 chronic kidney disease, or unspecified chronic kidney disease: Secondary | ICD-10-CM | POA: Diagnosis not present

## 2023-03-25 DIAGNOSIS — S2231XD Fracture of one rib, right side, subsequent encounter for fracture with routine healing: Secondary | ICD-10-CM | POA: Diagnosis not present

## 2023-03-25 DIAGNOSIS — E785 Hyperlipidemia, unspecified: Secondary | ICD-10-CM | POA: Diagnosis not present

## 2023-03-25 DIAGNOSIS — K219 Gastro-esophageal reflux disease without esophagitis: Secondary | ICD-10-CM | POA: Diagnosis not present

## 2023-03-25 DIAGNOSIS — N1831 Chronic kidney disease, stage 3a: Secondary | ICD-10-CM | POA: Diagnosis not present

## 2023-03-25 DIAGNOSIS — D539 Nutritional anemia, unspecified: Secondary | ICD-10-CM | POA: Diagnosis not present

## 2023-03-25 DIAGNOSIS — I5032 Chronic diastolic (congestive) heart failure: Secondary | ICD-10-CM | POA: Diagnosis not present

## 2023-03-25 DIAGNOSIS — I7143 Infrarenal abdominal aortic aneurysm, without rupture: Secondary | ICD-10-CM | POA: Diagnosis not present

## 2023-03-25 DIAGNOSIS — S32592D Other specified fracture of left pubis, subsequent encounter for fracture with routine healing: Secondary | ICD-10-CM | POA: Diagnosis not present

## 2023-03-25 DIAGNOSIS — Z556 Problems related to health literacy: Secondary | ICD-10-CM | POA: Diagnosis not present

## 2023-03-29 DIAGNOSIS — I5032 Chronic diastolic (congestive) heart failure: Secondary | ICD-10-CM | POA: Diagnosis not present

## 2023-03-29 DIAGNOSIS — S32592D Other specified fracture of left pubis, subsequent encounter for fracture with routine healing: Secondary | ICD-10-CM | POA: Diagnosis not present

## 2023-03-29 DIAGNOSIS — D539 Nutritional anemia, unspecified: Secondary | ICD-10-CM | POA: Diagnosis not present

## 2023-03-29 DIAGNOSIS — Z9181 History of falling: Secondary | ICD-10-CM | POA: Diagnosis not present

## 2023-03-29 DIAGNOSIS — N1831 Chronic kidney disease, stage 3a: Secondary | ICD-10-CM | POA: Diagnosis not present

## 2023-03-29 DIAGNOSIS — S2231XD Fracture of one rib, right side, subsequent encounter for fracture with routine healing: Secondary | ICD-10-CM | POA: Diagnosis not present

## 2023-03-29 DIAGNOSIS — I130 Hypertensive heart and chronic kidney disease with heart failure and stage 1 through stage 4 chronic kidney disease, or unspecified chronic kidney disease: Secondary | ICD-10-CM | POA: Diagnosis not present

## 2023-03-29 DIAGNOSIS — I7143 Infrarenal abdominal aortic aneurysm, without rupture: Secondary | ICD-10-CM | POA: Diagnosis not present

## 2023-03-29 DIAGNOSIS — Z556 Problems related to health literacy: Secondary | ICD-10-CM | POA: Diagnosis not present

## 2023-03-29 DIAGNOSIS — K219 Gastro-esophageal reflux disease without esophagitis: Secondary | ICD-10-CM | POA: Diagnosis not present

## 2023-03-29 DIAGNOSIS — E785 Hyperlipidemia, unspecified: Secondary | ICD-10-CM | POA: Diagnosis not present

## 2023-03-30 DIAGNOSIS — I130 Hypertensive heart and chronic kidney disease with heart failure and stage 1 through stage 4 chronic kidney disease, or unspecified chronic kidney disease: Secondary | ICD-10-CM | POA: Diagnosis not present

## 2023-03-30 DIAGNOSIS — K219 Gastro-esophageal reflux disease without esophagitis: Secondary | ICD-10-CM | POA: Diagnosis not present

## 2023-03-30 DIAGNOSIS — Z9181 History of falling: Secondary | ICD-10-CM | POA: Diagnosis not present

## 2023-03-30 DIAGNOSIS — S2231XD Fracture of one rib, right side, subsequent encounter for fracture with routine healing: Secondary | ICD-10-CM | POA: Diagnosis not present

## 2023-03-30 DIAGNOSIS — E785 Hyperlipidemia, unspecified: Secondary | ICD-10-CM | POA: Diagnosis not present

## 2023-03-30 DIAGNOSIS — N1831 Chronic kidney disease, stage 3a: Secondary | ICD-10-CM | POA: Diagnosis not present

## 2023-03-30 DIAGNOSIS — I5032 Chronic diastolic (congestive) heart failure: Secondary | ICD-10-CM | POA: Diagnosis not present

## 2023-03-30 DIAGNOSIS — S32592D Other specified fracture of left pubis, subsequent encounter for fracture with routine healing: Secondary | ICD-10-CM | POA: Diagnosis not present

## 2023-03-30 DIAGNOSIS — D539 Nutritional anemia, unspecified: Secondary | ICD-10-CM | POA: Diagnosis not present

## 2023-03-30 DIAGNOSIS — I7143 Infrarenal abdominal aortic aneurysm, without rupture: Secondary | ICD-10-CM | POA: Diagnosis not present

## 2023-03-30 DIAGNOSIS — Z556 Problems related to health literacy: Secondary | ICD-10-CM | POA: Diagnosis not present

## 2023-03-31 DIAGNOSIS — N1831 Chronic kidney disease, stage 3a: Secondary | ICD-10-CM | POA: Diagnosis not present

## 2023-03-31 DIAGNOSIS — S32592D Other specified fracture of left pubis, subsequent encounter for fracture with routine healing: Secondary | ICD-10-CM | POA: Diagnosis not present

## 2023-03-31 DIAGNOSIS — S2231XD Fracture of one rib, right side, subsequent encounter for fracture with routine healing: Secondary | ICD-10-CM | POA: Diagnosis not present

## 2023-03-31 DIAGNOSIS — I7143 Infrarenal abdominal aortic aneurysm, without rupture: Secondary | ICD-10-CM | POA: Diagnosis not present

## 2023-03-31 DIAGNOSIS — I5032 Chronic diastolic (congestive) heart failure: Secondary | ICD-10-CM | POA: Diagnosis not present

## 2023-03-31 DIAGNOSIS — Z9181 History of falling: Secondary | ICD-10-CM | POA: Diagnosis not present

## 2023-03-31 DIAGNOSIS — I130 Hypertensive heart and chronic kidney disease with heart failure and stage 1 through stage 4 chronic kidney disease, or unspecified chronic kidney disease: Secondary | ICD-10-CM | POA: Diagnosis not present

## 2023-03-31 DIAGNOSIS — D539 Nutritional anemia, unspecified: Secondary | ICD-10-CM | POA: Diagnosis not present

## 2023-03-31 DIAGNOSIS — Z556 Problems related to health literacy: Secondary | ICD-10-CM | POA: Diagnosis not present

## 2023-03-31 DIAGNOSIS — E785 Hyperlipidemia, unspecified: Secondary | ICD-10-CM | POA: Diagnosis not present

## 2023-03-31 DIAGNOSIS — K219 Gastro-esophageal reflux disease without esophagitis: Secondary | ICD-10-CM | POA: Diagnosis not present

## 2023-04-02 DIAGNOSIS — I130 Hypertensive heart and chronic kidney disease with heart failure and stage 1 through stage 4 chronic kidney disease, or unspecified chronic kidney disease: Secondary | ICD-10-CM | POA: Diagnosis not present

## 2023-04-02 DIAGNOSIS — E785 Hyperlipidemia, unspecified: Secondary | ICD-10-CM | POA: Diagnosis not present

## 2023-04-02 DIAGNOSIS — S32592D Other specified fracture of left pubis, subsequent encounter for fracture with routine healing: Secondary | ICD-10-CM | POA: Diagnosis not present

## 2023-04-02 DIAGNOSIS — S2231XD Fracture of one rib, right side, subsequent encounter for fracture with routine healing: Secondary | ICD-10-CM | POA: Diagnosis not present

## 2023-04-02 DIAGNOSIS — N1831 Chronic kidney disease, stage 3a: Secondary | ICD-10-CM | POA: Diagnosis not present

## 2023-04-02 DIAGNOSIS — I5032 Chronic diastolic (congestive) heart failure: Secondary | ICD-10-CM | POA: Diagnosis not present

## 2023-04-02 DIAGNOSIS — I7143 Infrarenal abdominal aortic aneurysm, without rupture: Secondary | ICD-10-CM | POA: Diagnosis not present

## 2023-04-02 DIAGNOSIS — Z9181 History of falling: Secondary | ICD-10-CM | POA: Diagnosis not present

## 2023-04-02 DIAGNOSIS — K219 Gastro-esophageal reflux disease without esophagitis: Secondary | ICD-10-CM | POA: Diagnosis not present

## 2023-04-02 DIAGNOSIS — D539 Nutritional anemia, unspecified: Secondary | ICD-10-CM | POA: Diagnosis not present

## 2023-04-02 DIAGNOSIS — Z556 Problems related to health literacy: Secondary | ICD-10-CM | POA: Diagnosis not present

## 2023-04-04 ENCOUNTER — Other Ambulatory Visit: Payer: Self-pay | Admitting: Physician Assistant

## 2023-04-04 DIAGNOSIS — D539 Nutritional anemia, unspecified: Secondary | ICD-10-CM | POA: Diagnosis not present

## 2023-04-04 DIAGNOSIS — I5032 Chronic diastolic (congestive) heart failure: Secondary | ICD-10-CM | POA: Diagnosis not present

## 2023-04-04 DIAGNOSIS — S32592D Other specified fracture of left pubis, subsequent encounter for fracture with routine healing: Secondary | ICD-10-CM | POA: Diagnosis not present

## 2023-04-04 DIAGNOSIS — E785 Hyperlipidemia, unspecified: Secondary | ICD-10-CM | POA: Diagnosis not present

## 2023-04-04 DIAGNOSIS — Z556 Problems related to health literacy: Secondary | ICD-10-CM | POA: Diagnosis not present

## 2023-04-04 DIAGNOSIS — Z9181 History of falling: Secondary | ICD-10-CM | POA: Diagnosis not present

## 2023-04-04 DIAGNOSIS — S2231XD Fracture of one rib, right side, subsequent encounter for fracture with routine healing: Secondary | ICD-10-CM | POA: Diagnosis not present

## 2023-04-04 DIAGNOSIS — I130 Hypertensive heart and chronic kidney disease with heart failure and stage 1 through stage 4 chronic kidney disease, or unspecified chronic kidney disease: Secondary | ICD-10-CM | POA: Diagnosis not present

## 2023-04-04 DIAGNOSIS — K219 Gastro-esophageal reflux disease without esophagitis: Secondary | ICD-10-CM | POA: Diagnosis not present

## 2023-04-04 DIAGNOSIS — N1831 Chronic kidney disease, stage 3a: Secondary | ICD-10-CM | POA: Diagnosis not present

## 2023-04-04 DIAGNOSIS — I7143 Infrarenal abdominal aortic aneurysm, without rupture: Secondary | ICD-10-CM | POA: Diagnosis not present

## 2023-04-10 DIAGNOSIS — I130 Hypertensive heart and chronic kidney disease with heart failure and stage 1 through stage 4 chronic kidney disease, or unspecified chronic kidney disease: Secondary | ICD-10-CM | POA: Diagnosis not present

## 2023-04-10 DIAGNOSIS — N1831 Chronic kidney disease, stage 3a: Secondary | ICD-10-CM | POA: Diagnosis not present

## 2023-04-10 DIAGNOSIS — S2231XD Fracture of one rib, right side, subsequent encounter for fracture with routine healing: Secondary | ICD-10-CM | POA: Diagnosis not present

## 2023-04-10 DIAGNOSIS — Z9181 History of falling: Secondary | ICD-10-CM | POA: Diagnosis not present

## 2023-04-10 DIAGNOSIS — S32592D Other specified fracture of left pubis, subsequent encounter for fracture with routine healing: Secondary | ICD-10-CM | POA: Diagnosis not present

## 2023-04-10 DIAGNOSIS — E785 Hyperlipidemia, unspecified: Secondary | ICD-10-CM | POA: Diagnosis not present

## 2023-04-10 DIAGNOSIS — I5032 Chronic diastolic (congestive) heart failure: Secondary | ICD-10-CM | POA: Diagnosis not present

## 2023-04-10 DIAGNOSIS — D539 Nutritional anemia, unspecified: Secondary | ICD-10-CM | POA: Diagnosis not present

## 2023-04-10 DIAGNOSIS — I7143 Infrarenal abdominal aortic aneurysm, without rupture: Secondary | ICD-10-CM | POA: Diagnosis not present

## 2023-04-10 DIAGNOSIS — K219 Gastro-esophageal reflux disease without esophagitis: Secondary | ICD-10-CM | POA: Diagnosis not present

## 2023-04-10 DIAGNOSIS — Z556 Problems related to health literacy: Secondary | ICD-10-CM | POA: Diagnosis not present

## 2023-04-17 DIAGNOSIS — N1831 Chronic kidney disease, stage 3a: Secondary | ICD-10-CM | POA: Diagnosis not present

## 2023-04-17 DIAGNOSIS — Z556 Problems related to health literacy: Secondary | ICD-10-CM | POA: Diagnosis not present

## 2023-04-17 DIAGNOSIS — I130 Hypertensive heart and chronic kidney disease with heart failure and stage 1 through stage 4 chronic kidney disease, or unspecified chronic kidney disease: Secondary | ICD-10-CM | POA: Diagnosis not present

## 2023-04-17 DIAGNOSIS — D539 Nutritional anemia, unspecified: Secondary | ICD-10-CM | POA: Diagnosis not present

## 2023-04-17 DIAGNOSIS — S32592D Other specified fracture of left pubis, subsequent encounter for fracture with routine healing: Secondary | ICD-10-CM | POA: Diagnosis not present

## 2023-04-17 DIAGNOSIS — K219 Gastro-esophageal reflux disease without esophagitis: Secondary | ICD-10-CM | POA: Diagnosis not present

## 2023-04-17 DIAGNOSIS — I5032 Chronic diastolic (congestive) heart failure: Secondary | ICD-10-CM | POA: Diagnosis not present

## 2023-04-17 DIAGNOSIS — Z9181 History of falling: Secondary | ICD-10-CM | POA: Diagnosis not present

## 2023-04-17 DIAGNOSIS — I7143 Infrarenal abdominal aortic aneurysm, without rupture: Secondary | ICD-10-CM | POA: Diagnosis not present

## 2023-04-17 DIAGNOSIS — S2231XD Fracture of one rib, right side, subsequent encounter for fracture with routine healing: Secondary | ICD-10-CM | POA: Diagnosis not present

## 2023-04-17 DIAGNOSIS — E785 Hyperlipidemia, unspecified: Secondary | ICD-10-CM | POA: Diagnosis not present

## 2023-04-19 DIAGNOSIS — E785 Hyperlipidemia, unspecified: Secondary | ICD-10-CM | POA: Diagnosis not present

## 2023-04-19 DIAGNOSIS — Z556 Problems related to health literacy: Secondary | ICD-10-CM | POA: Diagnosis not present

## 2023-04-19 DIAGNOSIS — S32592D Other specified fracture of left pubis, subsequent encounter for fracture with routine healing: Secondary | ICD-10-CM | POA: Diagnosis not present

## 2023-04-19 DIAGNOSIS — I7143 Infrarenal abdominal aortic aneurysm, without rupture: Secondary | ICD-10-CM | POA: Diagnosis not present

## 2023-04-19 DIAGNOSIS — Z9181 History of falling: Secondary | ICD-10-CM | POA: Diagnosis not present

## 2023-04-19 DIAGNOSIS — K219 Gastro-esophageal reflux disease without esophagitis: Secondary | ICD-10-CM | POA: Diagnosis not present

## 2023-04-19 DIAGNOSIS — S2231XD Fracture of one rib, right side, subsequent encounter for fracture with routine healing: Secondary | ICD-10-CM | POA: Diagnosis not present

## 2023-04-19 DIAGNOSIS — N1831 Chronic kidney disease, stage 3a: Secondary | ICD-10-CM | POA: Diagnosis not present

## 2023-04-19 DIAGNOSIS — D539 Nutritional anemia, unspecified: Secondary | ICD-10-CM | POA: Diagnosis not present

## 2023-04-19 DIAGNOSIS — I130 Hypertensive heart and chronic kidney disease with heart failure and stage 1 through stage 4 chronic kidney disease, or unspecified chronic kidney disease: Secondary | ICD-10-CM | POA: Diagnosis not present

## 2023-04-19 DIAGNOSIS — I5032 Chronic diastolic (congestive) heart failure: Secondary | ICD-10-CM | POA: Diagnosis not present

## 2023-04-26 DIAGNOSIS — N1831 Chronic kidney disease, stage 3a: Secondary | ICD-10-CM | POA: Diagnosis not present

## 2023-04-26 DIAGNOSIS — Z556 Problems related to health literacy: Secondary | ICD-10-CM | POA: Diagnosis not present

## 2023-04-26 DIAGNOSIS — I5032 Chronic diastolic (congestive) heart failure: Secondary | ICD-10-CM | POA: Diagnosis not present

## 2023-04-26 DIAGNOSIS — S32592D Other specified fracture of left pubis, subsequent encounter for fracture with routine healing: Secondary | ICD-10-CM | POA: Diagnosis not present

## 2023-04-26 DIAGNOSIS — S2231XD Fracture of one rib, right side, subsequent encounter for fracture with routine healing: Secondary | ICD-10-CM | POA: Diagnosis not present

## 2023-04-26 DIAGNOSIS — K219 Gastro-esophageal reflux disease without esophagitis: Secondary | ICD-10-CM | POA: Diagnosis not present

## 2023-04-26 DIAGNOSIS — D539 Nutritional anemia, unspecified: Secondary | ICD-10-CM | POA: Diagnosis not present

## 2023-04-26 DIAGNOSIS — I7143 Infrarenal abdominal aortic aneurysm, without rupture: Secondary | ICD-10-CM | POA: Diagnosis not present

## 2023-04-26 DIAGNOSIS — Z9181 History of falling: Secondary | ICD-10-CM | POA: Diagnosis not present

## 2023-04-26 DIAGNOSIS — I130 Hypertensive heart and chronic kidney disease with heart failure and stage 1 through stage 4 chronic kidney disease, or unspecified chronic kidney disease: Secondary | ICD-10-CM | POA: Diagnosis not present

## 2023-04-26 DIAGNOSIS — E785 Hyperlipidemia, unspecified: Secondary | ICD-10-CM | POA: Diagnosis not present

## 2023-04-28 DIAGNOSIS — E785 Hyperlipidemia, unspecified: Secondary | ICD-10-CM | POA: Diagnosis not present

## 2023-04-28 DIAGNOSIS — N1831 Chronic kidney disease, stage 3a: Secondary | ICD-10-CM | POA: Diagnosis not present

## 2023-04-28 DIAGNOSIS — D539 Nutritional anemia, unspecified: Secondary | ICD-10-CM | POA: Diagnosis not present

## 2023-04-28 DIAGNOSIS — Z556 Problems related to health literacy: Secondary | ICD-10-CM | POA: Diagnosis not present

## 2023-04-28 DIAGNOSIS — S2231XD Fracture of one rib, right side, subsequent encounter for fracture with routine healing: Secondary | ICD-10-CM | POA: Diagnosis not present

## 2023-04-28 DIAGNOSIS — I7143 Infrarenal abdominal aortic aneurysm, without rupture: Secondary | ICD-10-CM | POA: Diagnosis not present

## 2023-04-28 DIAGNOSIS — I5032 Chronic diastolic (congestive) heart failure: Secondary | ICD-10-CM | POA: Diagnosis not present

## 2023-04-28 DIAGNOSIS — K219 Gastro-esophageal reflux disease without esophagitis: Secondary | ICD-10-CM | POA: Diagnosis not present

## 2023-04-28 DIAGNOSIS — Z9181 History of falling: Secondary | ICD-10-CM | POA: Diagnosis not present

## 2023-04-28 DIAGNOSIS — I130 Hypertensive heart and chronic kidney disease with heart failure and stage 1 through stage 4 chronic kidney disease, or unspecified chronic kidney disease: Secondary | ICD-10-CM | POA: Diagnosis not present

## 2023-04-28 DIAGNOSIS — S32592D Other specified fracture of left pubis, subsequent encounter for fracture with routine healing: Secondary | ICD-10-CM | POA: Diagnosis not present

## 2023-05-02 DIAGNOSIS — Z9181 History of falling: Secondary | ICD-10-CM | POA: Diagnosis not present

## 2023-05-02 DIAGNOSIS — S32592D Other specified fracture of left pubis, subsequent encounter for fracture with routine healing: Secondary | ICD-10-CM | POA: Diagnosis not present

## 2023-05-02 DIAGNOSIS — S2231XD Fracture of one rib, right side, subsequent encounter for fracture with routine healing: Secondary | ICD-10-CM | POA: Diagnosis not present

## 2023-05-02 DIAGNOSIS — I130 Hypertensive heart and chronic kidney disease with heart failure and stage 1 through stage 4 chronic kidney disease, or unspecified chronic kidney disease: Secondary | ICD-10-CM | POA: Diagnosis not present

## 2023-05-02 DIAGNOSIS — D539 Nutritional anemia, unspecified: Secondary | ICD-10-CM | POA: Diagnosis not present

## 2023-05-02 DIAGNOSIS — Z556 Problems related to health literacy: Secondary | ICD-10-CM | POA: Diagnosis not present

## 2023-05-02 DIAGNOSIS — N1831 Chronic kidney disease, stage 3a: Secondary | ICD-10-CM | POA: Diagnosis not present

## 2023-05-02 DIAGNOSIS — E785 Hyperlipidemia, unspecified: Secondary | ICD-10-CM | POA: Diagnosis not present

## 2023-05-02 DIAGNOSIS — K219 Gastro-esophageal reflux disease without esophagitis: Secondary | ICD-10-CM | POA: Diagnosis not present

## 2023-05-02 DIAGNOSIS — I5032 Chronic diastolic (congestive) heart failure: Secondary | ICD-10-CM | POA: Diagnosis not present

## 2023-05-02 DIAGNOSIS — I7143 Infrarenal abdominal aortic aneurysm, without rupture: Secondary | ICD-10-CM | POA: Diagnosis not present

## 2023-05-12 DIAGNOSIS — R350 Frequency of micturition: Secondary | ICD-10-CM | POA: Diagnosis not present

## 2023-05-12 DIAGNOSIS — D649 Anemia, unspecified: Secondary | ICD-10-CM | POA: Diagnosis not present

## 2023-05-12 DIAGNOSIS — I77810 Thoracic aortic ectasia: Secondary | ICD-10-CM | POA: Diagnosis not present

## 2023-05-12 DIAGNOSIS — R600 Localized edema: Secondary | ICD-10-CM | POA: Diagnosis not present

## 2023-05-12 DIAGNOSIS — I5040 Unspecified combined systolic (congestive) and diastolic (congestive) heart failure: Secondary | ICD-10-CM | POA: Diagnosis not present

## 2023-05-12 DIAGNOSIS — I10 Essential (primary) hypertension: Secondary | ICD-10-CM | POA: Diagnosis not present

## 2023-05-12 DIAGNOSIS — N1831 Chronic kidney disease, stage 3a: Secondary | ICD-10-CM | POA: Diagnosis not present

## 2023-05-12 DIAGNOSIS — E559 Vitamin D deficiency, unspecified: Secondary | ICD-10-CM | POA: Diagnosis not present

## 2023-08-14 DIAGNOSIS — I5040 Unspecified combined systolic (congestive) and diastolic (congestive) heart failure: Secondary | ICD-10-CM | POA: Diagnosis not present

## 2023-08-14 DIAGNOSIS — N1831 Chronic kidney disease, stage 3a: Secondary | ICD-10-CM | POA: Diagnosis not present

## 2023-08-14 DIAGNOSIS — I10 Essential (primary) hypertension: Secondary | ICD-10-CM | POA: Diagnosis not present

## 2023-08-14 DIAGNOSIS — E7800 Pure hypercholesterolemia, unspecified: Secondary | ICD-10-CM | POA: Diagnosis not present

## 2023-08-21 ENCOUNTER — Telehealth (HOSPITAL_COMMUNITY): Payer: Self-pay | Admitting: Physician Assistant

## 2023-08-21 NOTE — Telephone Encounter (Signed)
 Will send this message to the ordering Provider and covering CMA, as a general FYI.

## 2023-08-21 NOTE — Telephone Encounter (Signed)
 I called to schedule echocardiogram per the order from [place], [place]. Patient interrupted me and declined to schedule. Order will be removed from the echo WQ. Thank you.

## 2023-08-30 ENCOUNTER — Other Ambulatory Visit: Payer: Self-pay | Admitting: Cardiology

## 2023-08-31 MED ORDER — TORSEMIDE 10 MG PO TABS: 10.0000 mg | ORAL_TABLET | Freq: Every day | ORAL | 0 refills | Status: DC

## 2023-09-14 DIAGNOSIS — E7800 Pure hypercholesterolemia, unspecified: Secondary | ICD-10-CM | POA: Diagnosis not present

## 2023-09-14 DIAGNOSIS — N1831 Chronic kidney disease, stage 3a: Secondary | ICD-10-CM | POA: Diagnosis not present

## 2023-09-14 DIAGNOSIS — I5040 Unspecified combined systolic (congestive) and diastolic (congestive) heart failure: Secondary | ICD-10-CM | POA: Diagnosis not present

## 2023-09-14 DIAGNOSIS — I10 Essential (primary) hypertension: Secondary | ICD-10-CM | POA: Diagnosis not present

## 2023-10-02 ENCOUNTER — Other Ambulatory Visit: Payer: Self-pay | Admitting: Cardiology

## 2023-10-15 DIAGNOSIS — E7800 Pure hypercholesterolemia, unspecified: Secondary | ICD-10-CM | POA: Diagnosis not present

## 2023-10-15 DIAGNOSIS — N1831 Chronic kidney disease, stage 3a: Secondary | ICD-10-CM | POA: Diagnosis not present

## 2023-10-15 DIAGNOSIS — I5040 Unspecified combined systolic (congestive) and diastolic (congestive) heart failure: Secondary | ICD-10-CM | POA: Diagnosis not present

## 2023-10-15 DIAGNOSIS — I10 Essential (primary) hypertension: Secondary | ICD-10-CM | POA: Diagnosis not present

## 2023-10-30 ENCOUNTER — Other Ambulatory Visit: Payer: Self-pay | Admitting: Cardiology

## 2023-11-01 ENCOUNTER — Other Ambulatory Visit: Payer: Self-pay | Admitting: Cardiology

## 2023-11-14 DIAGNOSIS — I5040 Unspecified combined systolic (congestive) and diastolic (congestive) heart failure: Secondary | ICD-10-CM | POA: Diagnosis not present

## 2023-11-14 DIAGNOSIS — N1831 Chronic kidney disease, stage 3a: Secondary | ICD-10-CM | POA: Diagnosis not present

## 2023-11-14 DIAGNOSIS — E7800 Pure hypercholesterolemia, unspecified: Secondary | ICD-10-CM | POA: Diagnosis not present

## 2023-11-14 DIAGNOSIS — I10 Essential (primary) hypertension: Secondary | ICD-10-CM | POA: Diagnosis not present

## 2023-11-15 ENCOUNTER — Other Ambulatory Visit: Payer: Self-pay | Admitting: Cardiology

## 2023-11-28 ENCOUNTER — Other Ambulatory Visit: Payer: Self-pay | Admitting: Cardiology

## 2023-12-11 ENCOUNTER — Other Ambulatory Visit: Payer: Self-pay | Admitting: Cardiology

## 2023-12-27 ENCOUNTER — Other Ambulatory Visit: Payer: Self-pay | Admitting: Cardiology

## 2023-12-30 ENCOUNTER — Emergency Department (HOSPITAL_COMMUNITY)

## 2023-12-30 ENCOUNTER — Inpatient Hospital Stay (HOSPITAL_COMMUNITY): Admitting: Certified Registered Nurse Anesthetist

## 2023-12-30 ENCOUNTER — Encounter (HOSPITAL_COMMUNITY)
Admission: EM | Disposition: A | Discharge status: Skilled Nursing Facility | Payer: Self-pay | Source: Home / Self Care | Attending: Internal Medicine

## 2023-12-30 ENCOUNTER — Other Ambulatory Visit: Payer: Self-pay

## 2023-12-30 ENCOUNTER — Inpatient Hospital Stay (HOSPITAL_COMMUNITY)

## 2023-12-30 ENCOUNTER — Inpatient Hospital Stay (HOSPITAL_COMMUNITY)
Admission: EM | Admit: 2023-12-30 | Discharge: 2024-01-05 | DRG: 480 | Disposition: A | Discharge status: Skilled Nursing Facility | Attending: Internal Medicine | Admitting: Internal Medicine

## 2023-12-30 DIAGNOSIS — D539 Nutritional anemia, unspecified: Secondary | ICD-10-CM | POA: Diagnosis present

## 2023-12-30 DIAGNOSIS — E46 Unspecified protein-calorie malnutrition: Secondary | ICD-10-CM | POA: Diagnosis present

## 2023-12-30 DIAGNOSIS — S72001A Fracture of unspecified part of neck of right femur, initial encounter for closed fracture: Secondary | ICD-10-CM | POA: Diagnosis present

## 2023-12-30 DIAGNOSIS — E875 Hyperkalemia: Secondary | ICD-10-CM | POA: Diagnosis not present

## 2023-12-30 DIAGNOSIS — E860 Dehydration: Secondary | ICD-10-CM | POA: Diagnosis present

## 2023-12-30 DIAGNOSIS — M84651A Pathological fracture in other disease, right femur, initial encounter for fracture: Secondary | ICD-10-CM | POA: Diagnosis present

## 2023-12-30 DIAGNOSIS — J9601 Acute respiratory failure with hypoxia: Secondary | ICD-10-CM | POA: Diagnosis not present

## 2023-12-30 DIAGNOSIS — N400 Enlarged prostate without lower urinary tract symptoms: Secondary | ICD-10-CM | POA: Diagnosis present

## 2023-12-30 DIAGNOSIS — E8809 Other disorders of plasma-protein metabolism, not elsewhere classified: Secondary | ICD-10-CM | POA: Diagnosis present

## 2023-12-30 DIAGNOSIS — K219 Gastro-esophageal reflux disease without esophagitis: Secondary | ICD-10-CM | POA: Diagnosis present

## 2023-12-30 DIAGNOSIS — E872 Acidosis, unspecified: Secondary | ICD-10-CM | POA: Diagnosis present

## 2023-12-30 DIAGNOSIS — M85861 Other specified disorders of bone density and structure, right lower leg: Secondary | ICD-10-CM | POA: Diagnosis present

## 2023-12-30 DIAGNOSIS — N179 Acute kidney failure, unspecified: Secondary | ICD-10-CM | POA: Diagnosis not present

## 2023-12-30 DIAGNOSIS — I5032 Chronic diastolic (congestive) heart failure: Secondary | ICD-10-CM | POA: Diagnosis present

## 2023-12-30 DIAGNOSIS — I2510 Atherosclerotic heart disease of native coronary artery without angina pectoris: Secondary | ICD-10-CM | POA: Diagnosis present

## 2023-12-30 DIAGNOSIS — N1831 Chronic kidney disease, stage 3a: Secondary | ICD-10-CM | POA: Diagnosis present

## 2023-12-30 DIAGNOSIS — R0902 Hypoxemia: Secondary | ICD-10-CM | POA: Diagnosis present

## 2023-12-30 DIAGNOSIS — D696 Thrombocytopenia, unspecified: Secondary | ICD-10-CM | POA: Diagnosis not present

## 2023-12-30 DIAGNOSIS — R627 Adult failure to thrive: Secondary | ICD-10-CM | POA: Diagnosis present

## 2023-12-30 DIAGNOSIS — W19XXXA Unspecified fall, initial encounter: Secondary | ICD-10-CM | POA: Diagnosis present

## 2023-12-30 DIAGNOSIS — I130 Hypertensive heart and chronic kidney disease with heart failure and stage 1 through stage 4 chronic kidney disease, or unspecified chronic kidney disease: Secondary | ICD-10-CM | POA: Diagnosis present

## 2023-12-30 DIAGNOSIS — U071 COVID-19: Secondary | ICD-10-CM | POA: Diagnosis present

## 2023-12-30 DIAGNOSIS — Z66 Do not resuscitate: Secondary | ICD-10-CM | POA: Diagnosis present

## 2023-12-30 DIAGNOSIS — E785 Hyperlipidemia, unspecified: Secondary | ICD-10-CM | POA: Diagnosis present

## 2023-12-30 DIAGNOSIS — S0081XA Abrasion of other part of head, initial encounter: Secondary | ICD-10-CM | POA: Diagnosis present

## 2023-12-30 DIAGNOSIS — D62 Acute posthemorrhagic anemia: Secondary | ICD-10-CM | POA: Diagnosis not present

## 2023-12-30 DIAGNOSIS — Z79899 Other long term (current) drug therapy: Secondary | ICD-10-CM

## 2023-12-30 DIAGNOSIS — S72141A Displaced intertrochanteric fracture of right femur, initial encounter for closed fracture: Principal | ICD-10-CM

## 2023-12-30 DIAGNOSIS — F0390 Unspecified dementia without behavioral disturbance: Secondary | ICD-10-CM | POA: Diagnosis present

## 2023-12-30 DIAGNOSIS — E039 Hypothyroidism, unspecified: Secondary | ICD-10-CM | POA: Diagnosis present

## 2023-12-30 DIAGNOSIS — L89151 Pressure ulcer of sacral region, stage 1: Secondary | ICD-10-CM | POA: Diagnosis present

## 2023-12-30 DIAGNOSIS — Z87891 Personal history of nicotine dependence: Secondary | ICD-10-CM | POA: Diagnosis not present

## 2023-12-30 DIAGNOSIS — Z96642 Presence of left artificial hip joint: Secondary | ICD-10-CM | POA: Diagnosis present

## 2023-12-30 DIAGNOSIS — Z7982 Long term (current) use of aspirin: Secondary | ICD-10-CM

## 2023-12-30 DIAGNOSIS — Z8249 Family history of ischemic heart disease and other diseases of the circulatory system: Secondary | ICD-10-CM

## 2023-12-30 DIAGNOSIS — Z6822 Body mass index (BMI) 22.0-22.9, adult: Secondary | ICD-10-CM

## 2023-12-30 DIAGNOSIS — R55 Syncope and collapse: Secondary | ICD-10-CM | POA: Diagnosis not present

## 2023-12-30 HISTORY — PX: INTRAMEDULLARY (IM) NAIL INTERTROCHANTERIC: SHX5875

## 2023-12-30 LAB — CBC WITH DIFFERENTIAL/PLATELET
Abs Immature Granulocytes: 0.05 K/uL (ref 0.00–0.07)
Basophils Absolute: 0 K/uL (ref 0.0–0.1)
Basophils Relative: 1 %
Eosinophils Absolute: 0 K/uL (ref 0.0–0.5)
Eosinophils Relative: 1 %
HCT: 35.8 % — ABNORMAL LOW (ref 39.0–52.0)
Hemoglobin: 11.5 g/dL — ABNORMAL LOW (ref 13.0–17.0)
Immature Granulocytes: 1 %
Lymphocytes Relative: 18 %
Lymphs Abs: 1.2 K/uL (ref 0.7–4.0)
MCH: 33.1 pg (ref 26.0–34.0)
MCHC: 32.1 g/dL (ref 30.0–36.0)
MCV: 103.2 fL — ABNORMAL HIGH (ref 80.0–100.0)
Monocytes Absolute: 0.8 K/uL (ref 0.1–1.0)
Monocytes Relative: 12 %
Neutro Abs: 4.3 K/uL (ref 1.7–7.7)
Neutrophils Relative %: 67 %
Platelets: 153 K/uL (ref 150–400)
RBC: 3.47 MIL/uL — ABNORMAL LOW (ref 4.22–5.81)
RDW: 12.1 % (ref 11.5–15.5)
WBC: 6.4 K/uL (ref 4.0–10.5)
nRBC: 0 % (ref 0.0–0.2)

## 2023-12-30 LAB — RESP PANEL BY RT-PCR (RSV, FLU A&B, COVID)  RVPGX2
Influenza A by PCR: NEGATIVE
Influenza B by PCR: NEGATIVE
Resp Syncytial Virus by PCR: NEGATIVE
SARS Coronavirus 2 by RT PCR: POSITIVE — AB

## 2023-12-30 LAB — COMPREHENSIVE METABOLIC PANEL WITH GFR
ALT: 17 U/L (ref 0–44)
AST: 23 U/L (ref 15–41)
Albumin: 2.6 g/dL — ABNORMAL LOW (ref 3.5–5.0)
Alkaline Phosphatase: 74 U/L (ref 38–126)
Anion gap: 14 (ref 5–15)
BUN: 35 mg/dL — ABNORMAL HIGH (ref 8–23)
CO2: 22 mmol/L (ref 22–32)
Calcium: 8.1 mg/dL — ABNORMAL LOW (ref 8.9–10.3)
Chloride: 100 mmol/L (ref 98–111)
Creatinine, Ser: 1.52 mg/dL — ABNORMAL HIGH (ref 0.61–1.24)
GFR, Estimated: 45 mL/min — ABNORMAL LOW (ref >60)
Glucose, Bld: 117 mg/dL — ABNORMAL HIGH (ref 70–99)
Potassium: 4.4 mmol/L (ref 3.5–5.1)
Sodium: 136 mmol/L (ref 135–145)
Total Bilirubin: 0.4 mg/dL (ref 0.0–1.2)
Total Protein: 5.8 g/dL — ABNORMAL LOW (ref 6.5–8.1)

## 2023-12-30 LAB — PROTIME-INR
INR: 1.1 (ref 0.8–1.2)
Prothrombin Time: 15.1 s (ref 11.4–15.2)

## 2023-12-30 LAB — SURGICAL PCR SCREEN
MRSA, PCR: NEGATIVE
Staphylococcus aureus: NEGATIVE

## 2023-12-30 LAB — MAGNESIUM: Magnesium: 2 mg/dL (ref 1.7–2.4)

## 2023-12-30 SURGERY — FIXATION, FRACTURE, INTERTROCHANTERIC, WITH INTRAMEDULLARY ROD
Anesthesia: General | Laterality: Right

## 2023-12-30 MED ORDER — LIDOCAINE 2% (20 MG/ML) 5 ML SYRINGE
INTRAMUSCULAR | Status: DC | PRN
  Administered 2023-12-30: 50 mg via INTRAVENOUS

## 2023-12-30 MED ORDER — LOPERAMIDE HCL 2 MG PO CAPS
2.0000 mg | ORAL_CAPSULE | Freq: Every day | ORAL | Status: DC
  Administered 2023-12-30 – 2024-01-04 (×6): 2 mg via ORAL
  Filled 2023-12-30 (×6): qty 1

## 2023-12-30 MED ORDER — FENTANYL CITRATE (PF) 50 MCG/ML IJ SOSY
25.0000 ug | PREFILLED_SYRINGE | INTRAMUSCULAR | Status: DC | PRN
Start: 2023-12-30 — End: 2023-12-30
  Administered 2023-12-30: 25 ug via INTRAVENOUS
  Filled 2023-12-30: qty 1

## 2023-12-30 MED ORDER — BISMUTH SUBSALICYLATE 262 MG/15ML PO SUSP: 30.0000 mL | Freq: Four times a day (QID) | ORAL | Status: DC | PRN

## 2023-12-30 MED ORDER — FENTANYL CITRATE (PF) 250 MCG/5ML IJ SOLN
INTRAMUSCULAR | Status: AC
  Filled 2023-12-30: qty 5

## 2023-12-30 MED ORDER — SERTRALINE HCL 25 MG PO TABS
25.0000 mg | ORAL_TABLET | Freq: Every day | ORAL | Status: DC
  Administered 2023-12-30 – 2024-01-05 (×7): 25 mg via ORAL
  Filled 2023-12-30 (×7): qty 1

## 2023-12-30 MED ORDER — MEMANTINE HCL 10 MG PO TABS
10.0000 mg | ORAL_TABLET | Freq: Two times a day (BID) | ORAL | Status: DC
  Administered 2023-12-30 – 2024-01-05 (×13): 10 mg via ORAL
  Filled 2023-12-30 (×13): qty 1

## 2023-12-30 MED ORDER — SODIUM CHLORIDE 0.9 % IV SOLN: INTRAVENOUS | Status: DC

## 2023-12-30 MED ORDER — FENTANYL CITRATE (PF) 250 MCG/5ML IJ SOLN
INTRAMUSCULAR | Status: DC | PRN
  Administered 2023-12-30: 25 ug via INTRAVENOUS
  Administered 2023-12-30: 75 ug via INTRAVENOUS
  Administered 2023-12-30: 25 ug via INTRAVENOUS

## 2023-12-30 MED ORDER — LACTATED RINGERS IV SOLN: INTRAVENOUS | Status: DC

## 2023-12-30 MED ORDER — ACETAMINOPHEN 325 MG PO TABS
650.0000 mg | ORAL_TABLET | Freq: Four times a day (QID) | ORAL | Status: DC | PRN
  Administered 2024-01-01 – 2024-01-05 (×2): 650 mg via ORAL
  Filled 2023-12-30 (×2): qty 2

## 2023-12-30 MED ORDER — ASPIRIN 81 MG PO TBEC
81.0000 mg | DELAYED_RELEASE_TABLET | Freq: Two times a day (BID) | ORAL | Status: DC
  Administered 2023-12-30 – 2024-01-05 (×13): 81 mg via ORAL
  Filled 2023-12-30 (×13): qty 1

## 2023-12-30 MED ORDER — ONDANSETRON HCL 4 MG/2ML IJ SOLN: 4.0000 mg | Freq: Four times a day (QID) | INTRAMUSCULAR | Status: DC | PRN

## 2023-12-30 MED ORDER — ETOMIDATE 2 MG/ML IV SOLN
INTRAVENOUS | Status: DC | PRN
  Administered 2023-12-30: 20 mg via INTRAVENOUS

## 2023-12-30 MED ORDER — 0.9 % SODIUM CHLORIDE (POUR BTL) OPTIME
TOPICAL | Status: DC | PRN
  Administered 2023-12-30: 1000 mL

## 2023-12-30 MED ORDER — DIVALPROEX SODIUM 250 MG PO DR TAB
250.0000 mg | DELAYED_RELEASE_TABLET | Freq: Two times a day (BID) | ORAL | Status: DC
  Administered 2023-12-30 – 2024-01-05 (×12): 250 mg via ORAL
  Filled 2023-12-30 (×13): qty 1

## 2023-12-30 MED ORDER — GUAIFENESIN 100 MG/5ML PO LIQD
5.0000 mL | ORAL | Status: DC | PRN
  Administered 2023-12-31: 5 mL via ORAL
  Filled 2023-12-30: qty 10

## 2023-12-30 MED ORDER — PANTOPRAZOLE SODIUM 40 MG PO TBEC
40.0000 mg | DELAYED_RELEASE_TABLET | Freq: Every day | ORAL | Status: DC
  Administered 2023-12-31 – 2024-01-05 (×6): 40 mg via ORAL
  Filled 2023-12-30 (×6): qty 1

## 2023-12-30 MED ORDER — DEXAMETHASONE SOD PHOSPHATE PF 10 MG/ML IJ SOLN
INTRAMUSCULAR | Status: DC | PRN
  Administered 2023-12-30: 5 mg via INTRAVENOUS

## 2023-12-30 MED ORDER — ACETAMINOPHEN 650 MG RE SUPP: 650.0000 mg | Freq: Four times a day (QID) | RECTAL | Status: DC | PRN

## 2023-12-30 MED ORDER — METHOCARBAMOL 500 MG PO TABS
500.0000 mg | ORAL_TABLET | Freq: Four times a day (QID) | ORAL | Status: DC
  Administered 2023-12-30: 500 mg via ORAL
  Administered 2023-12-30: 1000 mg via ORAL
  Administered 2023-12-30 – 2024-01-05 (×20): 500 mg via ORAL
  Filled 2023-12-30 (×21): qty 1
  Filled 2023-12-30: qty 2
  Filled 2023-12-30: qty 1

## 2023-12-30 MED ORDER — ROCURONIUM BROMIDE 10 MG/ML (PF) SYRINGE
PREFILLED_SYRINGE | INTRAVENOUS | Status: DC | PRN
  Administered 2023-12-30: 50 mg via INTRAVENOUS

## 2023-12-30 MED ORDER — MELATONIN 3 MG PO TABS: 3.0000 mg | ORAL_TABLET | Freq: Every evening | ORAL | Status: DC | PRN

## 2023-12-30 MED ORDER — PROPOFOL 10 MG/ML IV BOLUS
INTRAVENOUS | Status: AC
Start: 2023-12-30 — End: 2023-12-30
  Filled 2023-12-30: qty 20

## 2023-12-30 MED ORDER — ALBUTEROL SULFATE (2.5 MG/3ML) 0.083% IN NEBU: 2.5000 mg | INHALATION_SOLUTION | RESPIRATORY_TRACT | Status: DC | PRN

## 2023-12-30 MED ORDER — ONDANSETRON HCL 4 MG/2ML IJ SOLN
INTRAMUSCULAR | Status: DC | PRN
  Administered 2023-12-30: 4 mg via INTRAVENOUS

## 2023-12-30 MED ORDER — ONDANSETRON HCL 4 MG/2ML IJ SOLN
4.0000 mg | Freq: Once | INTRAMUSCULAR | Status: AC
  Administered 2023-12-30: 4 mg via INTRAVENOUS
  Filled 2023-12-30: qty 2

## 2023-12-30 MED ORDER — PHENYLEPHRINE 80 MCG/ML (10ML) SYRINGE FOR IV PUSH (FOR BLOOD PRESSURE SUPPORT)
PREFILLED_SYRINGE | INTRAVENOUS | Status: DC | PRN
  Administered 2023-12-30 (×2): 80 ug via INTRAVENOUS

## 2023-12-30 MED ORDER — FENTANYL CITRATE (PF) 50 MCG/ML IJ SOSY: 25.0000 ug | PREFILLED_SYRINGE | INTRAMUSCULAR | Status: DC | PRN

## 2023-12-30 MED ORDER — CHLORHEXIDINE GLUCONATE 0.12 % MT SOLN
OROMUCOSAL | Status: AC
Start: 2023-12-30 — End: 2023-12-30
  Filled 2023-12-30: qty 15

## 2023-12-30 MED ORDER — FENTANYL CITRATE (PF) 50 MCG/ML IJ SOSY
50.0000 ug | PREFILLED_SYRINGE | Freq: Once | INTRAMUSCULAR | Status: AC
  Administered 2023-12-30: 50 ug via INTRAVENOUS
  Filled 2023-12-30: qty 1

## 2023-12-30 MED ORDER — PRAVASTATIN SODIUM 40 MG PO TABS
40.0000 mg | ORAL_TABLET | Freq: Every day | ORAL | Status: DC
  Administered 2023-12-30 – 2024-01-04 (×6): 40 mg via ORAL
  Filled 2023-12-30 (×6): qty 1

## 2023-12-30 MED ORDER — HYDROCODONE-ACETAMINOPHEN 5-325 MG PO TABS
1.0000 | ORAL_TABLET | Freq: Four times a day (QID) | ORAL | Status: DC | PRN
  Administered 2024-01-01 – 2024-01-03 (×2): 2 via ORAL
  Administered 2024-01-04: 1 via ORAL
  Administered 2024-01-05: 2 via ORAL
  Administered 2024-01-05: 1 via ORAL
  Filled 2023-12-30: qty 2
  Filled 2023-12-30 (×2): qty 1
  Filled 2023-12-30 (×2): qty 2

## 2023-12-30 MED ORDER — ORAL CARE MOUTH RINSE: 15.0000 mL | Freq: Once | OROMUCOSAL | Status: AC

## 2023-12-30 MED ORDER — ADULT MULTIVITAMIN W/MINERALS CH
1.0000 | ORAL_TABLET | Freq: Every day | ORAL | Status: DC
  Administered 2023-12-31 – 2024-01-05 (×6): 1 via ORAL
  Filled 2023-12-30 (×6): qty 1

## 2023-12-30 MED ORDER — ALBUMIN HUMAN 5 % IV SOLN: INTRAVENOUS | Status: DC | PRN

## 2023-12-30 MED ORDER — SODIUM CHLORIDE 0.9% FLUSH
3.0000 mL | Freq: Two times a day (BID) | INTRAVENOUS | Status: DC
  Administered 2023-12-30 – 2024-01-04 (×12): 3 mL via INTRAVENOUS

## 2023-12-30 MED ORDER — POLYETHYLENE GLYCOL 3350 17 G PO PACK: 17.0000 g | PACK | Freq: Every day | ORAL | Status: DC | PRN

## 2023-12-30 MED ORDER — CHLORHEXIDINE GLUCONATE 0.12 % MT SOLN
15.0000 mL | Freq: Once | OROMUCOSAL | Status: AC
  Administered 2023-12-30: 15 mL via OROMUCOSAL

## 2023-12-30 MED ORDER — SUGAMMADEX SODIUM 200 MG/2ML IV SOLN
INTRAVENOUS | Status: DC | PRN
  Administered 2023-12-30: 147.8 mg via INTRAVENOUS

## 2023-12-30 MED ORDER — DEXAMETHASONE SOD PHOSPHATE PF 10 MG/ML IJ SOLN
6.0000 mg | Freq: Every day | INTRAMUSCULAR | Status: DC
  Administered 2023-12-30 – 2024-01-04 (×6): 6 mg via INTRAVENOUS

## 2023-12-30 MED ORDER — PHENYLEPHRINE HCL-NACL 20-0.9 MG/250ML-% IV SOLN
INTRAVENOUS | Status: DC | PRN
  Administered 2023-12-30: 60 ug/min via INTRAVENOUS

## 2023-12-30 SURGICAL SUPPLY — 35 items
BAG COUNTER SPONGE SURGICOUNT (BAG) ×1 IMPLANT
BIT DRILL INTERTAN LAG SCREW (BIT) IMPLANT
BIT DRILL LONG 4.0 (BIT) IMPLANT
BLADE SURG 15 STRL LF DISP TIS (BLADE) ×1 IMPLANT
BNDG COHESIVE 4X5 TAN STRL LF (GAUZE/BANDAGES/DRESSINGS) IMPLANT
COVER PERINEAL POST (MISCELLANEOUS) ×1 IMPLANT
COVER SURGICAL LIGHT HANDLE (MISCELLANEOUS) ×1 IMPLANT
DRAPE HALF SHEET 40X57 (DRAPES) ×2 IMPLANT
DRESSING MEPILEX FLEX 4X4 (GAUZE/BANDAGES/DRESSINGS) ×1 IMPLANT
DRSG ADAPTIC 3X8 NADH LF (GAUZE/BANDAGES/DRESSINGS) ×1 IMPLANT
DRSG MEPILEX POST OP 4X8 (GAUZE/BANDAGES/DRESSINGS) ×1 IMPLANT
DURAPREP 26ML APPLICATOR (WOUND CARE) ×1 IMPLANT
ELECTRODE REM PT RTRN 9FT ADLT (ELECTROSURGICAL) ×1 IMPLANT
EVACUATOR 1/8 PVC DRAIN (DRAIN) IMPLANT
GAUZE SPONGE 4X4 12PLY STRL (GAUZE/BANDAGES/DRESSINGS) IMPLANT
GLOVE BIO SURGEON STRL SZ 6.5 (GLOVE) ×1 IMPLANT
GLOVE BIO SURGEON STRL SZ8 (GLOVE) ×1 IMPLANT
GLOVE BIOGEL PI IND STRL 7.0 (GLOVE) ×1 IMPLANT
GLOVE BIOGEL PI IND STRL 8 (GLOVE) ×1 IMPLANT
GOWN STRL REUS W/ TWL LRG LVL3 (GOWN DISPOSABLE) ×2 IMPLANT
GOWN STRL REUS W/ TWL XL LVL3 (GOWN DISPOSABLE) ×1 IMPLANT
GUIDE PIN SLEEVE META-TAN (PIN) IMPLANT
KIT BASIN OR (CUSTOM PROCEDURE TRAY) ×1 IMPLANT
KIT TURNOVER KIT B (KITS) ×1 IMPLANT
MANIFOLD NEPTUNE II (INSTRUMENTS) ×1 IMPLANT
NAIL TRIGEN INTERTAN 10X18CM (Nail) IMPLANT
PACK GENERAL/GYN (CUSTOM PROCEDURE TRAY) ×1 IMPLANT
PAD ARMBOARD POSITIONER FOAM (MISCELLANEOUS) ×2 IMPLANT
SCREW LAG COMPR KIT 105/100 (Screw) IMPLANT
SCREW TRIGEN LOW PROF 5.0X35 (Screw) IMPLANT
SOLN 0.9% NACL POUR BTL 1000ML (IV SOLUTION) ×1 IMPLANT
SOLN STERILE WATER BTL 1000 ML (IV SOLUTION) ×2 IMPLANT
STAPLER SKIN PROX 35W (STAPLE) IMPLANT
SUT VIC AB 2-0 CTB1 (SUTURE) IMPLANT
TAPE CLOTH SURG 4X10 WHT LF (GAUZE/BANDAGES/DRESSINGS) IMPLANT

## 2023-12-30 NOTE — Progress Notes (Signed)
  Carryover admission to the Day Admitter.  I discussed this case with the EDP, Dr. Griselda.  Per these discussions:   This is a 83 year old male with history dementia, chronic diastolic heart failure, who lives at home with his daughter, who is being admitted with acute right intertrochanteric hip fracture after presenting for evaluation of unwitnessed fall in the bathroom yesterday.  Ensuing imaging reveals acute right intertrochanteric hip fracture.   Daughter conveys that the patient has shown evidence of new cough over the last 1 to 2 days, and tested positive for COVID via home COVID test.  Daughter is at similar symptoms over the last 6 days and also tested positive for COVID.   Patient has no known supplemental oxygen requirements.  He has a history of chronic diastolic heart failure, most recent echocardiogram in August 2024 showing LVEF 55 to 60% as well as grade 1 diastolic dysfunction.   Initial oxygen saturation's in the ED notable for O2 sat of 90% on room air, subsequently requiring 2 L in order to maintain oxygen saturation greater than 94%.   Chest x-ray shows no evidence of acute cardiopulmonary process.   EDP d/w on-call [facility], Dr. Beuford, who conveyed that [facility] will consult/see pt in the AM. He requests that patient be kept NPO and anticipates taking pt to OR today.  I have placed an order for inpatient admission for further evaluation and management of the above.  I have placed some additional preliminary admit orders via the adult multi-morbid admission order set. I have also ordered n.p.o., prn IV fentanyl ,.  IV Zofran , fall precautions, type and screen, INR.  We have also completed the focused COVID-19 infection order set, including orders for associated precautions, assess [place], flutter valve, prn albuterol  nebulizer.  Given new supplemental oxygen requirement, also ordered Decadron  6 mg IV daily, and have added on a magnesium  level.  Of note, his  CODE STATUS is DNR/DNI in the context of active DNR form as well as active MOST form reflecting this.     Eva Pore, DO Hospitalist

## 2023-12-30 NOTE — ED Provider Notes (Signed)
 [place] EMERGENCY DEPARTMENT AT [facility] Provider Note   CSN: [id number] Arrival date & time: 12/30/23  9952     Patient presents with: Fall (Fall in the bathroom unwitnessed daughter heard fall.)   [name] is a 83 y.o. male.   The history is provided by the EMS personnel, the patient, a relative and medical records.  Fall  [name] is a 83 y.o. male who presents to the Emergency Department complaining of fall.  He presents to the emergency department by EMS for evaluation of injuries following a an unwitnessed fall at home.  He he does have a history of dementia and lives with his daughter.  He was at the bathroom when his daughter heard a fall.  He he complains of pain to his right hip.  He denies any recent illnesses.  Daughter reports that yesterday he developed fever and cough and today tested positive for COVID-19.  He has received 2 doses of Paxlovid so far.  His daughter was sick last week with COVID.  Hx/o CHF, dementia, glacoma. Prior tracheostomy in 2017 due to need for multiple intestinal surgeries. Two doses of paxlovid so far.      Prior to Admission medications   Medication Sig Start Date End Date Taking? Authorizing Provider  bismuth  subsalicylate (PEPTO BISMOL) 262 MG/15ML suspension Take 30 mLs by mouth every 6 (six) hours as needed for indigestion.    [provider]  cholestyramine ORMA) 4 GM/DOSE powder Take 4 g by mouth See admin instructions. Mix 4 grams of powder into a glass of orange juice and drink every morning 03/12/20   [provider]  divalproex  (DEPAKOTE ) 125 MG DR tablet TAKE TWO TABLETS BY MOUTH TWICE DAILY 05/12/22   Gayland Lauraine PARAS, NP  loperamide  (IMODIUM ) 2 MG capsule Take 2 mg by mouth daily.    [provider]  memantine  (NAMENDA ) 10 MG tablet TAKE ONE TABLET BY MOUTH TWICE DAILY 05/12/22   Gayland Lauraine PARAS, NP  Multiple Vitamin (MULTI-VITAMINS) TABS Take 1 tablet by mouth daily with  breakfast.    [provider]  pantoprazole  (PROTONIX ) 40 MG tablet Take 40 mg by mouth daily before breakfast. 10/19/17   [provider]  polyethylene glycol (MIRALAX  / GLYCOLAX ) 17 g packet Take 17 g by mouth daily as needed. 02/20/23   Christobal Guadalajara, MD  pravastatin  (PRAVACHOL ) 40 MG tablet Take 40 mg by mouth at bedtime. 09/06/17   [provider]  sertraline (ZOLOFT) 25 MG tablet Take 25 mg by mouth daily. 01/31/23   [provider]  spironolactone  (ALDACTONE ) 25 MG tablet TAKE 1/2 TABLET BY MOUTH DAILY *PATIENT NEEDS APPOINTMENT FOR FURTHER REFILLS* 12/01/23   Jeffrie Oneil BROCKS, MD  torsemide  (DEMADEX ) 10 MG tablet TAKE 1 TABLET BY MOUTH DAILY *NEED APPOINTMENT* 12/01/23   Jeffrie Oneil BROCKS, MD  Vitamin D , Ergocalciferol , (DRISDOL) 1.25 MG (50000 UNIT) CAPS capsule Take 50,000 Units by mouth once a week. 01/17/23   [provider]    Allergies: Alfuzosin, Lisinopril, Lorazepam , Tamsulosin hcl, and Uroxatral [alfuzosin hcl er]    Review of Systems  All other systems reviewed and are negative.   Updated Vital Signs BP (!) 128/57   Pulse (!) 45   Resp 16   SpO2 90%   Physical Exam Vitals and nursing note reviewed.  Constitutional:      Appearance: He is well-developed.  HENT:     Head: Normocephalic.     Comments: Abrasion to right  cheek Neck:     Comments: No cervical spine tenderness Cardiovascular:     Rate and Rhythm: Normal rate and regular rhythm.     Heart sounds: No murmur heard. Pulmonary:     Effort: Pulmonary effort is normal. No respiratory distress.     Breath sounds: Normal breath sounds.  Abdominal:     Palpations: Abdomen is soft.     Tenderness: There is no abdominal tenderness. There is no guarding or rebound.  Musculoskeletal:     Comments: Tenderness to palpation over the right hip.  2+ DP pulses.  The right lower extremity is externally rotated and shortened.  Skin:    General: Skin is warm and dry.  Neurological:      Mental Status: He is alert.     Comments: Oriented to person, hospital.  Disoriented to recent events.  Wiggles toes bilaterally.  5 out of 5 grip strength bilaterally.  Psychiatric:        Behavior: Behavior normal.     (all labs ordered are listed, but only abnormal results are displayed) Labs Reviewed - No data to display  EKG: None  Radiology: No results found.   Procedures   Medications Ordered in the ED  fentaNYL  (SUBLIMAZE ) injection 50 mcg (has no administration in time range)  ondansetron  (ZOFRAN ) injection 4 mg (has no administration in time range)                                    Medical Decision Making Amount and/or Complexity of Data Reviewed Labs: ordered. Radiology: ordered.  Risk Prescription drug management. Decision regarding hospitalization.   Patient with history of dementia, CHF here for evaluation following a fall at home.  He is found to have a new oxygen requirement in the emergency department.  He did just test positive for COVID-19 at home.  Imaging is significant for right intertrochanteric femur fracture CBC with stable anemia.  CMP with stable renal insufficiency.  Discussed with Dr. Beuford with orthopedics-will see the patient in consult, recommends patient remain n.p.o.  Hospitalist consulted for admission for ongoing care.  Patient and daughter updated of findings of studies at bedside and are in agreement with admission.     Final diagnoses:  Displaced intertrochanteric fracture of right femur, initial encounter for closed fracture [facility])    ED Discharge Orders     None          Griselda Norris, MD 12/30/23 [phone]

## 2023-12-30 NOTE — Anesthesia Procedure Notes (Signed)
 Procedure Name: Intubation Date/Time: 12/30/2023 12:20 PM  Performed by: Zelphia Norleen HERO, CRNAPre-anesthesia Checklist: Patient identified, Emergency Drugs available, Suction available and Patient being monitored Patient Re-evaluated:Patient Re-evaluated prior to induction Oxygen Delivery Method: Circle system utilized Preoxygenation: Pre-oxygenation with 100% oxygen Induction Type: IV induction Ventilation: Mask ventilation without difficulty Laryngoscope Size: Mac and 3 Grade View: Grade II Tube type: Oral Tube size: 8.0 mm Number of attempts: 1 Airway Equipment and Method: Stylet and Oral airway Placement Confirmation: ETT inserted through vocal cords under direct vision, positive ETCO2 and breath sounds checked- equal and bilateral Secured at: 23 cm Tube secured with: Tape Dental Injury: Teeth and Oropharynx as per pre-operative assessment

## 2023-12-30 NOTE — ED Notes (Signed)
 O2 noticed to be at 88% on RA. 2L applied, spO2 @ 93%

## 2023-12-30 NOTE — Op Note (Addendum)
 PATIENT NAME: [name]   MEDICAL RECORD NO.:   [id number]    DATE OF BIRTH: Jun 07, 1940   DATE OF PROCEDURE: 12/30/2023                               OPERATIVE REPORT     PREOPERATIVE DIAGNOSES: 1.  Right intertrochanteric fracture   POSTOPERATIVE DIAGNOSES: 1.  Right intertrochanteric fracture   PROCEDURES: Closed reduction and intramedullary fixation of right intertrochanteric fracture   SURGEON:  Oneil Priestly, MD.   ASSISTANT:  None   ANESTHESIA:  General endotracheal anesthesia.   COMPLICATIONS:  None.   DISPOSITION:  Stable.   ESTIMATED BLOOD LOSS:  Minimal.   INDICATIONS FOR SURGERY:  Briefly, [name] is an 83 year old male who presented to the emergency department with right hip pain following a fall.  Imaging studies revealed an intertrochanteric fracture on the right.  I did discuss the patient's situation with both the patient and his daughter.  We did discuss the need for surgical intervention, in order to restore his ability to put weight on his right leg.  We did discuss the risks of surgery, including infection, neurovascular injury, malunion, and nonunion, and the need for possible subsequent surgical intervention.  The patient's daughter, who is his power of attorney, did agree to proceed.   OPERATIVE DETAILS:  On 12/30/2023, the patient was brought to surgery and general endotracheal anesthesia was administered.  The patient was placed supine on a fracture table with a perennial post applied.  The foot of the operative extremity was secured to a boot, and traction was applied.  The nonoperative extremity was flexed and abducted.  A reduction maneuver was performed by use of the fracture table.  Once appropriate reduction was obtained, the region of the right hip was prepped and draped in the usual sterile fashion.  Incision was made laterally, proximal to the greater trochanter.  A guidewire was advanced through the greater trochanter, over the  intramedullary canal.  An entry reamer was utilized.  A [employer] short intramedullary nail was advanced across the greater trochanter and into the femoral canal.  Excellent reduction was maintained.  I then made a more distal incision, and advanced the guidewire, laterally to medially, through the nail and into the femoral head.  A compression screw hole was then drilled, and a compression screw was advanced through the nail and into the femoral head. The lag screw hole was then drilled and a lag screw was advanced. Compression was then applied across the fracture site.  The lag screw was then locked to the nail, and then a distal locking screw was advanced into the distal aspect of the nail.  I was very pleased with the final AP and lateral fluoroscopic images.  The wounds were copiously irrigated and closed using #1 Vicryl, followed by 2-0 Vicryl, followed by staples.    Oneil Priestly, MD

## 2023-12-30 NOTE — Plan of Care (Signed)
°  Problem: Education: Goal: Knowledge of risk factors and measures for prevention of condition will improve Outcome: Progressing   Problem: Respiratory: Goal: Will maintain a patent airway Outcome: Progressing   Problem: Nutrition: Goal: Adequate nutrition will be maintained Outcome: Progressing

## 2023-12-30 NOTE — ED Notes (Signed)
 Report called to receiving floor.

## 2023-12-30 NOTE — Progress Notes (Signed)
    Patient underwent uneventful right closed reduction and intramedullary fixation of right intertrochanteric fracture in the OR. Pts daughter states he has dementia and ambulated with a walker at baseline before the fall. He lives with his daughter. We appreciate the medicine teams care for this patient while inpatient. Expectation is he will obtain PT/OT and likely D/C home with daughter vs to rehab facility per their recs. F/U in office with us  2 weeks PO. Maintain/reinforce dressing as needed and keep dry until 7 days PO at which point it can be removed and he can bathe normally.   Ileana Clara, PA-C

## 2023-12-30 NOTE — ED Triage Notes (Signed)
 Pt fell in the bathroom daughter heard fall did not see it. Pt has right sided hip deformity and pain. Mild shortening and rotation

## 2023-12-30 NOTE — Transfer of Care (Signed)
 Immediate Anesthesia Transfer of Care Note  Patient: Danny CAPPIELLO  Procedure(s) Performed: FIXATION, FRACTURE, INTERTROCHANTERIC, WITH INTRAMEDULLARY ROD (Right)  Patient Location: PACU  Anesthesia Type:General  Level of Consciousness: awake and alert   Airway & Oxygen Therapy: Patient Spontanous Breathing and Patient connected to nasal cannula oxygen  Post-op Assessment: Report given to RN and Post -op Vital signs reviewed and stable  Post vital signs: Reviewed and stable  Last Vitals:  Vitals Value Taken Time  BP 104/36 12/30/23 13:45  Temp 36.9 C 12/30/23 13:40  Pulse 108 12/30/23 13:54  Resp 14 12/30/23 13:54  SpO2 87 % 12/30/23 13:54  Vitals shown include unfiled device data.  Last Pain:  Vitals:   12/30/23 1340  TempSrc:   PainSc: Asleep      Patients Stated Pain Goal: 0 (12/30/23 1003)  Complications: No notable events documented.

## 2023-12-30 NOTE — Anesthesia Preprocedure Evaluation (Addendum)
 Anesthesia Evaluation  Patient identified by MRN, date of birth, ID band Patient awake    Reviewed: Allergy & Precautions, H&P , NPO status , Patient's Chart, lab work & pertinent test results  Airway Mallampati: II   Neck ROM: full    Dental   Pulmonary former smoker +COVID   breath sounds clear to auscultation       Cardiovascular hypertension, + CAD, + Past MI, + Cardiac Stents, + Peripheral Vascular Disease and +CHF   Rhythm:regular Rate:Normal     Neuro/Psych  PSYCHIATRIC DISORDERS Anxiety Depression   Dementia    GI/Hepatic PUD,GERD  ,,  Endo/Other    Renal/GU Renal InsufficiencyRenal disease     Musculoskeletal   Abdominal   Peds  Hematology   Anesthesia Other Findings   Reproductive/Obstetrics                              Anesthesia Physical Anesthesia Plan  ASA: 3  Anesthesia Plan: General   Post-op Pain Management:    Induction: Intravenous  PONV Risk Score and Plan: 2 and Ondansetron , Dexamethasone  and Treatment may vary due to age or medical condition  Airway Management Planned: Oral ETT  Additional Equipment:   Intra-op Plan:   Post-operative Plan: Extubation in OR  Informed Consent: I have reviewed the patients History and Physical, chart, labs and discussed the procedure including the risks, benefits and alternatives for the proposed anesthesia with the patient or authorized representative who has indicated his/her understanding and acceptance.     Dental advisory given  Plan Discussed with: CRNA, Anesthesiologist and Surgeon  Anesthesia Plan Comments:          Anesthesia Quick Evaluation

## 2023-12-30 NOTE — H&P (Signed)
 History and Physical    Patient: [name] FMW:[id number] DOB: [birth year] DOA: 12/30/2023 DOS: the patient was seen and examined on 12/30/2023 PCP: Charlott Dorn LABOR, MD  Patient coming from: Home= lives with daughter  Chief Complaint:  Chief Complaint  Patient presents with   Fall    Fall in the bathroom unwitnessed daughter heard fall.   HPI: [name] is a 83 y.o. male with medical history significant of CAD, hypothyroidism, hypertension, dyslipidemia, dementia with protein calorie malnutrition and failure to thrive, GERD, previous stage II sacral decubitus, CKD 3.  Patient was sent to the ER after an unwitnessed fall in the bathroom.  The daughter heard the patient fall.  He was found to have right-sided hip deformity and pain with shortening and rotation.  Labs revealed mild acute kidney injury with current creatinine 1.52 with baseline between 1.2 and 1.3.  He was afebrile with normal vital signs.  Initially oxygen was placed due to apparent O2 sat of 90%.  Subsequently O2 sats remained greater than 98 and oxygen was removed with sats between 95 and 100%.  Chest x-ray was unremarkable.  Daughter reported to EDP that on 11/14 the patient had fever and cough.  Subsequently he has tested positive for COVID.  At some point he was given 2 doses of Paxlovid.  Of note the daughter was sick with COVID last week.  Hospital service has been asked to evaluate the patient for admission.  Upon my evaluation of the patient he had no recollection of the fall or while he was in the hospital.  I told him he was here because he fractured his hip.  I palpated on his right hip and he noted at that time pain that radiated to the back.  He stated he was hungry and thirsty.  On exam he was noted to have an abrasion on his right cheek.   Review of Systems: As mentioned in the history of present illness. All other systems reviewed and are negative. Past Medical History:  Diagnosis Date    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 09/25/2015 09/25/2015   Aspiration pneumonia (HCC) 01/17/2016   Bilateral dry eyes    Bile peritonitis due to gastric tube dislodgement 12/03/2015   BPH (benign prostatic hyperplasia)    Central pterygium of right eye    Chronic respiratory failure with hypoxia (HCC)    Coronary artery disease 1997, 2009   bare mental stent right coronart artery, occlusive followed by Dr. 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 [facility] in [place]   Peptic ulcer    Pneumatosis of intestines 12/11/2015   Pneumonia    HISTORY OF IN CHILDHOOD   Posterior vitreous detachment 10/30/2012   Pyloric stricture 11/24/2015   Salzmann's nodular dystrophy of left eye    SBO (small bowel obstruction) (HCC) 01/20/2016   Urinary hesitancy    Wears glasses    Past Surgical History:  Procedure Laterality Date   CARDIAC CATHETERIZATION  CHOLECYSTECTOMY     ESOPHAGOGASTRODUODENOSCOPY N/A 12/01/2015   Procedure: ESOPHAGOGASTRODUODENOSCOPY (EGD) BALLOON DILATION OF DUODENAL STRICTURE;  Surgeon: Elspeth Schultze, MD;  Location: [facility];  Service: General;  Laterality: N/A;   ESOPHAGOGASTRODUODENOSCOPY N/A 12/14/2015   Procedure: ESOPHAGOGASTRODUODENOSCOPY (EGD);  Surgeon: Oliva Boots, MD;  Location: [facility];  Service: Endoscopy;  Laterality: N/A;  Patient has tracheostomy   ESOPHAGOGASTRODUODENOSCOPY (EGD) WITH PROPOFOL  N/A 07/01/2015   Procedure: ESOPHAGOGASTRODUODENOSCOPY (EGD) WITH PROPOFOL ;  Surgeon: Elsie Cree, MD;  Location: [facility];  Service: Endoscopy;  Laterality: N/A;   ESOPHAGOGASTRODUODENOSCOPY (EGD) WITH PROPOFOL  N/A 11/23/2015   Procedure: ESOPHAGOGASTRODUODENOSCOPY (EGD) WITH PROPOFOL ;  Surgeon: Elsie Cree, MD;  Location: [facility];  Service: Endoscopy;  Laterality: N/A;  may need to intubate   EYE SURGERY     growth on right eye removed    HIP ARTHROPLASTY Left 07/07/2020   Procedure: ARTHROPLASTY BIPOLAR HIP (HEMIARTHROPLASTY);  Surgeon: Josefina Chew, MD;  Location: [facility];  Service: Orthopedics;  Laterality: Left;   IR GENERIC HISTORICAL  12/13/2015   IR REPLC DUODEN/JEJUNO TUBE PERCUT W/FLUORO 12/13/2015 Rome Hall, MD [facility]   IR GENERIC HISTORICAL  02/03/2016   IR REPLC GASTRO/COLONIC TUBE PERCUT W/FLUORO 02/03/2016 Ozell Specking, MD [facility]   IR GENERIC HISTORICAL  02/04/2016   IR GASTR TUBE CONVERT GASTR-JEJ PER W/FL MOD SED 02/04/2016 Wilkie Lent, MD [facility]   IR GENERIC HISTORICAL  02/25/2016   IR SINUS/FIST TUBE CHK-NON GI 02/25/2016 Marcey Moan, MD [facility]   IR GENERIC HISTORICAL  03/18/2016   IR CM INJ ANY COLONIC TUBE W/FLUORO 03/18/2016 Norleen Roulette, MD [facility]   IR GENERIC HISTORICAL  03/18/2016   IR GJ TUBE CHANGE 03/18/2016 Norleen Roulette, MD [facility]   IR GENERIC HISTORICAL  03/22/2016   IR CM INJ ANY COLONIC TUBE W/FLUORO 03/22/2016 Norleen Roulette, MD [facility]   IR GENERIC HISTORICAL  04/13/2016   IR CM INJ ANY COLONIC TUBE W/FLUORO 04/13/2016 Juliene Balder, MD [facility]   LAPAROSCOPIC GASTROSTOMY N/A 12/01/2015   Procedure: LAPAROSCOPIC PLACEMENT OF FEEDING JEJUNOSTOMY AND GASTROSTOMY TUBE;  Surgeon: Elspeth Schultze, MD;  Location: [facility];  Service: General;  Laterality: N/A;   LAPAROSCOPY N/A 12/03/2015   Procedure: LAPAROSCOPY DIAGNOSTIC, OMENTOPEXY, JEJUNOSTOMY, WASH OUT;  Surgeon: Elspeth Schultze, MD;  Location: [facility];  Service: General;  Laterality: N/A;   LAPAROTOMY N/A 02/06/2016   Procedure: EXPLORATORY LAPAROTOMY,  LYSIS OF ADHESIONS, SEGMENTAL SMALL BOWEL RESECTION, GASTROJEJUNOSTOMY;  Surgeon: Deward Null III, MD;  Location: [facility];  Service: General;  Laterality: N/A;   STENT TO HEART     2 1997 AND 1 IN 1996   TONSILLECTOMY     XI ROBOTIC VAGOTOMY AND ANTRECTOMY N/A 09/25/2015   Procedure: XI ROBOTIC ANTERIOR AND POSTERIOR VAGOTOMY, BILROTH I  ANASTOMOSIS DOR FUNDIPLICATION, OMENTOPEXY  UPPER ENDOSCOPY;  Surgeon: Elspeth Schultze, MD;  Location: [facility];  Service: General;  Laterality: N/A;   Social History:  reports that he quit smoking about 28 years ago. His smoking use included cigarettes. He started smoking about 48 years ago. He has a 40 pack-year smoking history. He has never used smokeless tobacco. He reports that he does not drink alcohol  and does not use drugs.  Allergies  Allergen Reactions   Alfuzosin Other (See Comments)    Leg weakness    Lisinopril Cough   Lorazepam  Other (See Comments)    i don't like it  Prefers Xanax  or valium    Tamsulosin Hcl Other (See Comments)  Leg weakness   Uroxatral [Alfuzosin Hcl Er] Other (See Comments)    Leg weakness    Family History  Problem Relation Age of Onset   Heart attack Mother     Prior to Admission medications   Medication Sig Start Date End Date Taking? Authorizing Provider  bismuth  subsalicylate (PEPTO BISMOL) 262 MG/15ML suspension Take 30 mLs by mouth every 6 (six) hours as needed for indigestion.    [provider]  cholestyramine ORMA) 4 GM/DOSE powder Take 4 g by mouth See admin instructions. Mix 4 grams of powder into a glass of orange juice and drink every morning 03/12/20   [provider]  divalproex  (DEPAKOTE ) 125 MG DR tablet TAKE TWO TABLETS BY MOUTH TWICE DAILY 05/12/22   Gayland Lauraine PARAS, NP  loperamide  (IMODIUM ) 2 MG capsule Take 2 mg by mouth daily.    [provider]  memantine  (NAMENDA ) 10 MG tablet TAKE ONE TABLET BY MOUTH TWICE DAILY 05/12/22   Gayland Lauraine PARAS, NP  Multiple Vitamin  (MULTI-VITAMINS) TABS Take 1 tablet by mouth daily with breakfast.    [provider]  pantoprazole  (PROTONIX ) 40 MG tablet Take 40 mg by mouth daily before breakfast. 10/19/17   [provider]  polyethylene glycol (MIRALAX  / GLYCOLAX ) 17 g packet Take 17 g by mouth daily as needed. 02/20/23   Christobal Guadalajara, MD  pravastatin  (PRAVACHOL ) 40 MG tablet Take 40 mg by mouth at bedtime. 09/06/17   [provider]  sertraline (ZOLOFT) 25 MG tablet Take 25 mg by mouth daily. 01/31/23   [provider]  spironolactone  (ALDACTONE ) 25 MG tablet TAKE 1/2 TABLET BY MOUTH DAILY *PATIENT NEEDS APPOINTMENT FOR FURTHER REFILLS* 12/01/23   Jeffrie Oneil BROCKS, MD  torsemide  (DEMADEX ) 10 MG tablet TAKE 1 TABLET BY MOUTH DAILY *NEED APPOINTMENT* 12/01/23   Jeffrie Oneil BROCKS, MD  Vitamin D , Ergocalciferol , (DRISDOL) 1.25 MG (50000 UNIT) CAPS capsule Take 50,000 Units by mouth once a week. 01/17/23   [provider]    Physical Exam: Vitals:   12/30/23 0500 12/30/23 0600 12/30/23 0630 12/30/23 0730  BP: 104/72 101/66 100/62 (!) 112/59  Pulse: (!) 50 97 92 94  Resp: 18 17 15 17   Temp:      TempSrc:      SpO2: 100% 100% 100% 99%   Constitutional: NAD, calm, comfortable Respiratory: Normal respiratory effort. No accessory muscle use.  Room air.  Wet sounding cough but lungs are clear Cardiovascular: Regular rate and rhythm, no murmurs / rubs / gallops. No extremity edema. 2+ pedal pulses.  Abdomen: no tenderness, no masses palpated. No hepatosplenomegaly. Bowel sounds positive.  Musculoskeletal: no clubbing / cyanosis. No joint deformity upper and lower extremities. Good ROM, no contractures. Normal muscle tone.  Skin: Abrasion right cheek Neurologic: CN 2-12 grossly intact. Sensation intact, Strength 4/5.  Patient unable to move right lower extremity secondary to pain Psychiatric:  Alert and oriented x name only.  Pleasant mood.     Data Reviewed:  Sodium 136, potassium 4.4, CO2  22, glucose 117, BUN 35, creatinine 1.52 magnesium  2.0, albumin  2.6, total protein 5.8, LFTs are normal  WBC 6400 with normal differential, hemoglobin 11.5, MCV 103.2, platelets 153,000  Respiratory PCR positive for COVID  Chest x-ray without acute process.  Left hip x-ray showed acute comminuted intertrochanteric fracture of the right femur  CT of the head without contrast without acute intracranial abnormality  CT cervical spine without any acute abnormality  Assessment and Plan: Mechanical fall at  home unwitnessed Acute comminuted closed fracture right femur Operative intervention today-VTE prophylaxis at discretion of orthopedic team PT/OT evaluation a.m. on 11/16-likely will need short-term SNF rehab prior to returning home Utilize combination of narcotic and nonnarcotic pain medications Trend hemoglobin postoperatively-baseline 11.5  Acute COVID infection Currently asymptomatic in regards to hypoxia therefore no indication for steroids- chest x-ray is negative Continue to follow for worsening of symptoms Early mobilization  Dementia Pleasant at baseline Continue preadmission Depakote  and Namenda  Continue Zoloft  CAD/hypertension/dyslipidemia Continue statin Diuretics on hold  CKD 3 Creatinine slightly higher than baseline and patient appears somewhat dehydrated (likely related to acute COVID infection and recent fever) Hold Demadex  and spironolactone  Follow lab  GERD Continue PPI  Protein calorie malnutrition/failure to thrive Nutrition consultation Notable for hypoalbuminemia and hypoproteinemia  History of prior stage II sacral decubitus Unable to examine sacrum upon initial evaluation Recommend reevaluate postoperatively    Advance Care Planning:   Code Status: Limited: Do not attempt resuscitation (DNR) -DNR-LIMITED -Do Not Intubate/DNI    VTE prophylaxis: Currently on SCDs.  Administration of pharmacological DVT prophylaxis at discretion of  orthopedic team  Consults: Orthopedic team  Family Communication: Daughter not at bedside  Severity of Illness: The appropriate patient status for this patient is INPATIENT. Inpatient status is judged to be reasonable and necessary in order to provide the required intensity of service to ensure the patient's safety. The patient's presenting symptoms, physical exam findings, and initial radiographic and laboratory data in the context of their chronic comorbidities is felt to place them at high risk for further clinical deterioration. Furthermore, it is not anticipated that the patient will be medically stable for discharge from the hospital within 2 midnights of admission.   * I certify that at the point of admission it is my clinical judgment that the patient will require inpatient hospital care spanning beyond 2 midnights from the point of admission due to high intensity of service, high risk for further deterioration and high frequency of surveillance required.*  Author: Isaiah Lever, NP 12/30/2023 7:57 AM  For on call review [email or web address].

## 2023-12-30 NOTE — Consult Note (Signed)
 Reason for Consult:Right hip pain  [name] is an 83 y.o. male.  HPI: Patient is an 83 year old male, who walks with a walker at baseline, with dementia, who presented to the emergency department early this a.m. following a fall.  Imaging studies revealed an intertrochanteric right hip fracture.  I was called to evaluate the patient.  Patient does live with his daughter.  [name] does report pain with active and passive range of motion of the right hip.  Past Medical History:  Diagnosis Date   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 09/25/2015 09/25/2015   Aspiration pneumonia (HCC) 01/17/2016   Bilateral dry eyes    Bile peritonitis due to gastric tube dislodgement 12/03/2015   BPH (benign prostatic hyperplasia)    Central pterygium of right eye    Chronic respiratory failure with hypoxia (HCC)    Coronary artery disease 1997, 2009   bare mental stent right coronart artery, occlusive followed by Dr. 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 in [place]   Peptic ulcer    Pneumatosis of intestines 12/11/2015   Pneumonia    HISTORY OF IN CHILDHOOD   Posterior vitreous detachment 10/30/2012   Pyloric stricture 11/24/2015   Salzmann's nodular dystrophy of left eye    SBO (small bowel obstruction) (HCC) 01/20/2016   Urinary hesitancy    Wears glasses     Past Surgical History:  Procedure Laterality Date   CARDIAC CATHETERIZATION     CHOLECYSTECTOMY     ESOPHAGOGASTRODUODENOSCOPY N/A 12/01/2015   Procedure:  ESOPHAGOGASTRODUODENOSCOPY (EGD) BALLOON DILATION OF DUODENAL STRICTURE;  Surgeon: Elspeth Schultze, MD;  Location: [facility];  Service: General;  Laterality: N/A;   ESOPHAGOGASTRODUODENOSCOPY N/A 12/14/2015   Procedure: ESOPHAGOGASTRODUODENOSCOPY (EGD);  Surgeon: Oliva Boots, MD;  Location: [facility];  Service: Endoscopy;  Laterality: N/A;  Patient has tracheostomy   ESOPHAGOGASTRODUODENOSCOPY (EGD) WITH PROPOFOL  N/A 07/01/2015   Procedure: ESOPHAGOGASTRODUODENOSCOPY (EGD) WITH PROPOFOL ;  Surgeon: Elsie Cree, MD;  Location: [facility];  Service: Endoscopy;  Laterality: N/A;   ESOPHAGOGASTRODUODENOSCOPY (EGD) WITH PROPOFOL  N/A 11/23/2015   Procedure: ESOPHAGOGASTRODUODENOSCOPY (EGD) WITH PROPOFOL ;  Surgeon: Elsie Cree, MD;  Location: [facility];  Service: Endoscopy;  Laterality: N/A;  may need to intubate   EYE SURGERY     growth on right eye removed    HIP ARTHROPLASTY Left 07/07/2020   Procedure: ARTHROPLASTY BIPOLAR HIP (HEMIARTHROPLASTY);  Surgeon: Josefina Chew, MD;  Location: [facility];  Service: Orthopedics;  Laterality: Left;   IR GENERIC HISTORICAL  12/13/2015   IR REPLC DUODEN/JEJUNO TUBE PERCUT W/FLUORO 12/13/2015 Rome Hall, MD [facility]   IR GENERIC HISTORICAL  02/03/2016   IR REPLC GASTRO/COLONIC TUBE PERCUT W/FLUORO 02/03/2016 Ozell Specking, MD [facility]   IR GENERIC HISTORICAL  02/04/2016   IR GASTR TUBE CONVERT GASTR-JEJ PER W/FL MOD SED 02/04/2016 Wilkie Lent, MD [facility]   IR GENERIC HISTORICAL  02/25/2016   IR SINUS/FIST TUBE CHK-NON GI 02/25/2016 Marcey Moan, MD [facility]   IR GENERIC  HISTORICAL  03/18/2016   IR CM INJ ANY COLONIC TUBE W/FLUORO 03/18/2016 Norleen Roulette, MD [facility]   IR GENERIC HISTORICAL  03/18/2016   IR GJ TUBE CHANGE 03/18/2016 Norleen Roulette, MD [facility]   IR GENERIC HISTORICAL  03/22/2016   IR CM INJ ANY COLONIC TUBE W/FLUORO 03/22/2016 Norleen Roulette, MD [facility]   IR GENERIC HISTORICAL  04/13/2016   IR CM INJ ANY COLONIC TUBE  W/FLUORO 04/13/2016 Juliene Balder, MD [facility]   LAPAROSCOPIC GASTROSTOMY N/A 12/01/2015   Procedure: LAPAROSCOPIC PLACEMENT OF FEEDING JEJUNOSTOMY AND GASTROSTOMY TUBE;  Surgeon: Elspeth Schultze, MD;  Location: [facility];  Service: General;  Laterality: N/A;   LAPAROSCOPY N/A 12/03/2015   Procedure: LAPAROSCOPY DIAGNOSTIC, OMENTOPEXY, JEJUNOSTOMY, WASH OUT;  Surgeon: Elspeth Schultze, MD;  Location: [facility];  Service: General;  Laterality: N/A;   LAPAROTOMY N/A 02/06/2016   Procedure: EXPLORATORY LAPAROTOMY, LYSIS OF ADHESIONS, SEGMENTAL SMALL BOWEL RESECTION, GASTROJEJUNOSTOMY;  Surgeon: Deward Null III, MD;  Location: [facility];  Service: General;  Laterality: N/A;   STENT TO HEART     2 1997 AND 1 IN 1996   TONSILLECTOMY     XI ROBOTIC VAGOTOMY AND ANTRECTOMY N/A 09/25/2015   Procedure: XI ROBOTIC ANTERIOR AND POSTERIOR VAGOTOMY, BILROTH I  ANASTOMOSIS DOR FUNDIPLICATION, OMENTOPEXY  UPPER ENDOSCOPY;  Surgeon: Elspeth Schultze, MD;  Location: [facility];  Service: General;  Laterality: N/A;    Family History  Problem Relation Age of Onset   Heart attack Mother     Social History:  reports that [name] quit smoking about 28 years ago. His smoking use included cigarettes. [name] started smoking about 48 years ago. [name] has a 40 pack-year smoking history. [name] has never used smokeless tobacco. [name] reports that [name] does not drink alcohol  and does not use drugs.  Allergies:  Allergies  Allergen Reactions   Alfuzosin Other (See Comments)    Leg weakness    Lisinopril Cough   Lorazepam  Other (See Comments)    i don't like it  Prefers Xanax  or valium    Tamsulosin Hcl Other (See Comments)    Leg weakness   Uroxatral [Alfuzosin Hcl Er] Other (See Comments)    Leg weakness    Medications: I have reviewed the patient's current medications.  Results for orders placed or performed during the hospital encounter of 12/30/23 (from the past 48 hours)  Comprehensive metabolic panel     Status: Abnormal   Collection Time:  12/30/23 12:53 AM  Result Value Ref Range   Sodium 136 135 - 145 mmol/L   Potassium 4.4 3.5 - 5.1 mmol/L   Chloride 100 98 - 111 mmol/L   CO2 22 22 - 32 mmol/L   Glucose, Bld 117 (H) 70 - 99 mg/dL    Comment: Glucose reference range applies only to samples taken after fasting for at least 8 hours.   BUN 35 (H) 8 - 23 mg/dL   Creatinine, Ser 8.47 (H) 0.61 - 1.24 mg/dL   Calcium  8.1 (L) 8.9 - 10.3 mg/dL   Total Protein 5.8 (L) 6.5 - 8.1 g/dL   Albumin  2.6 (L) 3.5 - 5.0 g/dL   AST 23 15 - 41 U/L   ALT 17 0 - 44 U/L   Alkaline Phosphatase 74 38 - 126 U/L   Total Bilirubin 0.4 0.0 - 1.2 mg/dL   GFR, Estimated 45 (L) >60 mL/min    Comment: (NOTE) Calculated using the CKD-EPI Creatinine Equation (2021)    Anion gap 14 5 - 15  Comment: Performed at [facility], [address], [place], [place] [address]  CBC with Differential     Status: Abnormal   Collection Time: 12/30/23 12:53 AM  Result Value Ref Range   WBC 6.4 4.0 - 10.5 K/uL   RBC 3.47 (L) 4.22 - 5.81 MIL/uL   Hemoglobin 11.5 (L) 13.0 - 17.0 g/dL   HCT 64.1 (L) 60.9 - 47.9 %   MCV 103.2 (H) 80.0 - 100.0 fL   MCH 33.1 26.0 - 34.0 pg   MCHC 32.1 30.0 - 36.0 g/dL   RDW 87.8 88.4 - 84.4 %   Platelets 153 150 - 400 K/uL   nRBC 0.0 0.0 - 0.2 %   Neutrophils Relative % 67 %   Neutro Abs 4.3 1.7 - 7.7 K/uL   Lymphocytes Relative 18 %   Lymphs Abs 1.2 0.7 - 4.0 K/uL   Monocytes Relative 12 %   Monocytes Absolute 0.8 0.1 - 1.0 K/uL   Eosinophils Relative 1 %   Eosinophils Absolute 0.0 0.0 - 0.5 K/uL   Basophils Relative 1 %   Basophils Absolute 0.0 0.0 - 0.1 K/uL   Immature Granulocytes 1 %   Abs Immature Granulocytes 0.05 0.00 - 0.07 K/uL    Comment: Performed at [facility], [address] [place], [place] [address]  Resp panel by RT-PCR (RSV, Flu A&B, Covid) Anterior Nasal Swab     Status: Abnormal   Collection Time: 12/30/23  2:02 AM   Specimen: Anterior Nasal Swab  Result Value Ref Range   SARS  Coronavirus 2 by RT PCR POSITIVE (A) NEGATIVE   Influenza A by PCR NEGATIVE NEGATIVE   Influenza B by PCR NEGATIVE NEGATIVE    Comment: (NOTE) The Xpert Xpress SARS-CoV-2/FLU/RSV plus assay is intended as an aid in the diagnosis of influenza from Nasopharyngeal swab specimens and should not be used as a sole basis for treatment. Nasal washings and aspirates are unacceptable for Xpert Xpress SARS-CoV-2/FLU/RSV testing.  Fact Sheet for Patients: bloggercourse.com  Fact Sheet for Healthcare Providers: seriousbroker.it  This test is not yet approved or cleared by the [place]  FDA and has been authorized for detection and/or diagnosis of SARS-CoV-2 by FDA under an Emergency Use Authorization (EUA). This EUA will remain in effect (meaning this test can be used) for the duration of the COVID-19 declaration under Section 564(b)(1) of the Act, 21 U.S.C. section 360bbb-3(b)(1), unless the authorization is terminated or revoked.     Resp Syncytial Virus by PCR NEGATIVE NEGATIVE    Comment: (NOTE) Fact Sheet for Patients: bloggercourse.com  Fact Sheet for Healthcare Providers: seriousbroker.it  This test is not yet approved or cleared by the [place]  FDA and has been authorized for detection and/or diagnosis of SARS-CoV-2 by FDA under an Emergency Use Authorization (EUA). This EUA will remain in effect (meaning this test can be used) for the duration of the COVID-19 declaration under Section 564(b)(1) of the Act, 21 U.S.C. section 360bbb-3(b)(1), unless the authorization is terminated or revoked.  Performed at [facility], [address] [place], [place] [address]   Type and screen [facility]     Status: None   Collection Time: 12/30/23  7:30 AM  Result Value Ref Range   ABO/RH(D) A POS    Antibody Screen NEG    Sample Expiration       01/02/2024,2359 Performed at [facility], [address], [place], [place] [address]   Magnesium   Status: None   Collection Time: 12/30/23  7:50 AM  Result Value Ref Range   Magnesium  2.0 1.7 - 2.4 mg/dL    Comment: Performed at [facility], [address], [place], [place] [address]  Protime-INR     Status: None   Collection Time: 12/30/23  7:50 AM  Result Value Ref Range   Prothrombin Time 15.1 11.4 - 15.2 seconds   INR 1.1 0.8 - 1.2    Comment: (NOTE) INR goal varies based on device and disease states. Performed at [facility], [address], [place], [place] [address]     CT Cervical Spine Wo Contrast Result Date: 12/30/2023 EXAM: CT CERVICAL SPINE WITHOUT CONTRAST 12/30/2023 02:10:00 AM TECHNIQUE: CT of the cervical spine was performed without the administration of intravenous contrast. Multiplanar reformatted images are provided for review. Automated exposure control, iterative reconstruction, and/or weight based adjustment of the mA/kV was utilized to reduce the radiation dose to as low as reasonably achievable. COMPARISON: None available. CLINICAL HISTORY: Neck trauma (Age >= 65y). FINDINGS: CERVICAL SPINE: BONES AND ALIGNMENT: No acute fracture or traumatic malalignment. DEGENERATIVE CHANGES: Disc space narrowing and endplate remodeling at C5-C6 in keeping with mild degenerative disc disease. Multilevel uncovertebral and facet arthrosis results in multilevel moderate-to-severe neuroforaminal narrowing, most severe on the right at C5-C6 and on the left at C3-C4. SOFT TISSUES: No prevertebral soft tissue swelling. IMPRESSION: 1. No acute abnormality of the cervical spine related to the reported neck trauma. 2. Mild degenerative disc disease at C5-6 with multilevel uncovertebral and facet arthrosis resulting in multilevel moderate-to-severe neuroforaminal narrowing, most severe on the right at C6 and on the left at C3-4. Electronically signed by: Dorethia Molt MD  12/30/2023 02:23 AM EST RP Workstation: [id number]   CT Head Wo Contrast Result Date: 12/30/2023 EXAM: CT HEAD WITHOUT CONTRAST 12/30/2023 02:10:00 AM TECHNIQUE: CT of the head was performed without the administration of intravenous contrast. Automated exposure control, iterative reconstruction, and/or weight based adjustment of the mA/kV was utilized to reduce the radiation dose to as low as reasonably achievable. COMPARISON: 02/15/2023 CLINICAL HISTORY: Head trauma, minor (Age >= 65y) FINDINGS: BRAIN AND VENTRICLES: No acute hemorrhage. No evidence of acute infarct. No hydrocephalus. No extra-axial collection. No mass effect or midline shift. Global cortical atrophy, cerebellar predominant. Subcortical and periventricular small vessel ischemic changes. ORBITS: No acute abnormality. SINUSES: No acute abnormality. SOFT TISSUES AND SKULL: No acute soft tissue abnormality. No skull fracture. IMPRESSION: 1. No acute intracranial abnormality. Electronically signed by: Pinkie Pebbles MD 12/30/2023 02:19 AM EST RP Workstation: [id number]   DG Hip Unilat W or Wo Pelvis 2-3 Views Right Result Date: 12/30/2023 EXAM: 3 VIEW(S) XRAY OF THE UNILATERAL HIP 12/30/2023 01:30:00 AM COMPARISON: None available. CLINICAL HISTORY: fall FINDINGS: BONES AND JOINTS: Left hip arthroplasty hardware in place. Acute comminuted intertrochanteric fracture of the right femur with associated varus angulation. Mildly displaced lesser trochanteric fragment. SOFT TISSUES: Right lower quadrant ostomy noted. IMPRESSION: 1. Acute comminuted intertrochanteric fracture of the right femur . Electronically signed by: Pinkie Pebbles MD 12/30/2023 01:35 AM EST RP Workstation: [id number]   DG Chest Port 1 View Result Date: 12/30/2023 EXAM: 1 VIEW(S) XRAY OF THE CHEST 12/30/2023 01:30:00 AM COMPARISON: 08/15/2023 CLINICAL HISTORY: fall FINDINGS: LUNGS AND PLEURA: No focal pulmonary opacity. No pleural effusion. No pneumothorax. HEART AND  MEDIASTINUM: No acute abnormality of the cardiac and mediastinal silhouettes. BONES AND SOFT TISSUES: No acute osseous abnormality. IMPRESSION: 1. No acute cardiopulmonary process. Electronically signed by: Pinkie  Verdene MD 12/30/2023 01:34 AM EST RP Workstation: [id number]    Review of Systems Blood pressure (!) 110/54, pulse 98, temperature 98.4 F (36.9 C), temperature source Oral, resp. rate 16, SpO2 98%. Physical Exam Constitutional:      Appearance: [name] is normal weight.  HENT:     Head: Normocephalic and atraumatic.     Nose: Nose normal.  Eyes:     Extraocular Movements: Extraocular movements intact.     Pupils: Pupils are equal, round, and reactive to light.  Cardiovascular:     Rate and Rhythm: Normal rate.     Pulses: Normal pulses.  Pulmonary:     Effort: Pulmonary effort is normal.  Abdominal:     General: Abdomen is flat.     Palpations: Abdomen is soft.  Musculoskeletal:     Cervical back: Neck supple.     Comments:   Pain with passive range of motion of the right hip is noted  Skin:    General: Skin is warm and dry.     Capillary Refill: Capillary refill takes less than 2 seconds.  Neurological:     General: No focal deficit present.     Mental Status: [name] is alert.     Assessment/Plan: Patient is noted to have an intertrochanteric right hip fracture.   I did discuss the patient's situation with his daughter [name].  She is his power of attorney.  I did discuss that given the unstable nature of the hip fracture, intramedullary fixation is indicated.  We did discuss the nature of the procedure, as well as the risks of surgery, including infection, neurovascular injury, nonunion, and malunion, and the possible need for additional surgery.  Will go ahead and get surgery set up at our earliest convenience.   It was explained to the daughter that the patient may need to stay at a rehabilitation facility until [name] is at a functional level that is amenable to him safely  caring for himself at home with the assistance of his daughter.   All of their questions were answered.  Will proceed with surgery this morning.  Joretta Eads L Juhi Lagrange 12/30/2023, 9:39 AM

## 2023-12-30 NOTE — ED Notes (Signed)
 CCMD called for cardiac monitoring.

## 2023-12-30 NOTE — H&P (Signed)
 [name] is an 83 y.o. male.  HPI: Patient is an 83 year old male, who walks with a walker at baseline, with dementia, who presented to the emergency department early this a.m. following a fall.  Imaging studies revealed an intertrochanteric right hip fracture.  I was called to evaluate the patient.  Patient does live with his daughter.  He does report pain with active and passive range of motion of the right hip.       Past Medical History:  Diagnosis Date   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 09/25/2015 09/25/2015   Aspiration pneumonia (HCC) 01/17/2016   Bilateral dry eyes     Bile peritonitis due to gastric tube dislodgement 12/03/2015   BPH (benign prostatic hyperplasia)     Central pterygium of right eye     Chronic respiratory failure with hypoxia (HCC)     Coronary artery disease 1997, 2009    bare mental stent right coronart artery, occlusive followed by Dr. 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 [facility] in [place]   Peptic ulcer     Pneumatosis of intestines 12/11/2015   Pneumonia      HISTORY OF IN CHILDHOOD   Posterior vitreous detachment 10/30/2012   Pyloric stricture 11/24/2015   Salzmann's nodular dystrophy of left eye     SBO (small bowel obstruction) (HCC) 01/20/2016   Urinary hesitancy     Wears glasses                 Past Surgical History:  Procedure Laterality Date   CARDIAC CATHETERIZATION       CHOLECYSTECTOMY       ESOPHAGOGASTRODUODENOSCOPY N/A 12/01/2015     Procedure: ESOPHAGOGASTRODUODENOSCOPY (EGD) BALLOON DILATION OF DUODENAL STRICTURE;  Surgeon: Elspeth Schultze, MD;  Location: [facility];  Service: General;  Laterality: N/A;   ESOPHAGOGASTRODUODENOSCOPY N/A 12/14/2015    Procedure: ESOPHAGOGASTRODUODENOSCOPY (EGD);  Surgeon: Oliva Boots, MD;  Location: [facility];  Service: Endoscopy;  Laterality: N/A;  Patient has tracheostomy   ESOPHAGOGASTRODUODENOSCOPY (EGD) WITH PROPOFOL  N/A 07/01/2015    Procedure: ESOPHAGOGASTRODUODENOSCOPY (EGD) WITH PROPOFOL ;  Surgeon: Elsie Cree, MD;  Location: [facility];  Service: Endoscopy;  Laterality: N/A;   ESOPHAGOGASTRODUODENOSCOPY (EGD) WITH PROPOFOL  N/A 11/23/2015    Procedure: ESOPHAGOGASTRODUODENOSCOPY (EGD) WITH PROPOFOL ;  Surgeon: Elsie Cree, MD;  Location: [facility];  Service: Endoscopy;  Laterality: N/A;  may need to intubate   EYE SURGERY        growth on right eye removed    HIP ARTHROPLASTY Left 07/07/2020    Procedure: ARTHROPLASTY BIPOLAR HIP (HEMIARTHROPLASTY);  Surgeon: Josefina Chew, MD;  Location: [facility];  Service: Orthopedics;  Laterality: Left;   IR GENERIC HISTORICAL   12/13/2015    IR REPLC DUODEN/JEJUNO TUBE PERCUT W/FLUORO 12/13/2015 Rome Hall, MD [facility]   IR GENERIC HISTORICAL   02/03/2016    IR REPLC GASTRO/COLONIC TUBE PERCUT W/FLUORO 02/03/2016 Ozell Specking, MD [facility]  IR GENERIC HISTORICAL   02/04/2016    IR GASTR TUBE CONVERT GASTR-JEJ PER W/FL MOD SED 02/04/2016 Wilkie Lent, MD [facility]   IR GENERIC HISTORICAL   02/25/2016    IR SINUS/FIST TUBE CHK-NON GI 02/25/2016 Marcey Moan, MD [facility]   IR GENERIC HISTORICAL   03/18/2016    IR CM INJ ANY COLONIC TUBE W/FLUORO 03/18/2016 Norleen Roulette, MD [facility]   IR GENERIC HISTORICAL   03/18/2016    IR GJ TUBE CHANGE 03/18/2016 Norleen Roulette, MD [facility]   IR GENERIC HISTORICAL   03/22/2016    IR CM INJ ANY COLONIC TUBE W/FLUORO 03/22/2016 Norleen Roulette, MD [facility]   IR GENERIC HISTORICAL   04/13/2016     IR CM INJ ANY COLONIC TUBE W/FLUORO 04/13/2016 Juliene Balder, MD [facility]   LAPAROSCOPIC GASTROSTOMY N/A 12/01/2015    Procedure: LAPAROSCOPIC PLACEMENT OF FEEDING JEJUNOSTOMY AND GASTROSTOMY TUBE;  Surgeon: Elspeth Schultze, MD;  Location: [facility];  Service: General;  Laterality: N/A;   LAPAROSCOPY N/A 12/03/2015    Procedure: LAPAROSCOPY DIAGNOSTIC, OMENTOPEXY, JEJUNOSTOMY, WASH OUT;  Surgeon: Elspeth Schultze, MD;  Location: [facility];  Service: General;  Laterality: N/A;   LAPAROTOMY N/A 02/06/2016    Procedure: EXPLORATORY LAPAROTOMY, LYSIS OF ADHESIONS, SEGMENTAL SMALL BOWEL RESECTION, GASTROJEJUNOSTOMY;  Surgeon: Deward Null III, MD;  Location: [facility];  Service: General;  Laterality: N/A;   STENT TO HEART        2 1997 AND 1 IN 1996   TONSILLECTOMY       XI ROBOTIC VAGOTOMY AND ANTRECTOMY N/A 09/25/2015    Procedure: XI ROBOTIC ANTERIOR AND POSTERIOR VAGOTOMY, BILROTH I  ANASTOMOSIS DOR FUNDIPLICATION, OMENTOPEXY  UPPER ENDOSCOPY;  Surgeon: Elspeth Schultze, MD;  Location: [facility];  Service: General;  Laterality: N/A;               Family History  Problem Relation Age of Onset   Heart attack Mother            Social History:  reports that he quit smoking about 28 years ago. His smoking use included cigarettes. He started smoking about 48 years ago. He has a 40 pack-year smoking history. He has never used smokeless tobacco. He reports that he does not drink alcohol  and does not use drugs.   Allergies:  Allergies       Allergies  Allergen Reactions   Alfuzosin Other (See Comments)      Leg weakness     Lisinopril Cough   Lorazepam  Other (See Comments)      i don't like it  Prefers Xanax  or valium    Tamsulosin Hcl Other (See Comments)      Leg weakness   Uroxatral [Alfuzosin Hcl Er] Other (See Comments)      Leg weakness        Medications: I have reviewed the patient's current medications.   Lab Results Last 48 Hours        Results for orders placed or performed during the  hospital encounter of 12/30/23 (from the past 48 hours)  Comprehensive metabolic panel     Status: Abnormal    Collection Time: 12/30/23 12:53 AM  Result Value Ref Range    Sodium 136 135 - 145 mmol/L    Potassium 4.4 3.5 - 5.1 mmol/L    Chloride 100 98 - 111 mmol/L    CO2 22 22 - 32 mmol/L    Glucose, Bld 117 (H) 70 - 99 mg/dL  Comment: Glucose reference range applies only to samples taken after fasting for at least 8 hours.    BUN 35 (H) 8 - 23 mg/dL    Creatinine, Ser 8.47 (H) 0.61 - 1.24 mg/dL    Calcium  8.1 (L) 8.9 - 10.3 mg/dL    Total Protein 5.8 (L) 6.5 - 8.1 g/dL    Albumin  2.6 (L) 3.5 - 5.0 g/dL    AST 23 15 - 41 U/L    ALT 17 0 - 44 U/L    Alkaline Phosphatase 74 38 - 126 U/L    Total Bilirubin 0.4 0.0 - 1.2 mg/dL    GFR, Estimated 45 (L) >60 mL/min      Comment: (NOTE) Calculated using the CKD-EPI Creatinine Equation (2021)      Anion gap 14 5 - 15      Comment: Performed at [facility], [address], [place], [place] [address]  CBC with Differential     Status: Abnormal    Collection Time: 12/30/23 12:53 AM  Result Value Ref Range    WBC 6.4 4.0 - 10.5 K/uL    RBC 3.47 (L) 4.22 - 5.81 MIL/uL    Hemoglobin 11.5 (L) 13.0 - 17.0 g/dL    HCT 64.1 (L) 60.9 - 52.0 %    MCV 103.2 (H) 80.0 - 100.0 fL    MCH 33.1 26.0 - 34.0 pg    MCHC 32.1 30.0 - 36.0 g/dL    RDW 87.8 88.4 - 84.4 %    Platelets 153 150 - 400 K/uL    nRBC 0.0 0.0 - 0.2 %    Neutrophils Relative % 67 %    Neutro Abs 4.3 1.7 - 7.7 K/uL    Lymphocytes Relative 18 %    Lymphs Abs 1.2 0.7 - 4.0 K/uL    Monocytes Relative 12 %    Monocytes Absolute 0.8 0.1 - 1.0 K/uL    Eosinophils Relative 1 %    Eosinophils Absolute 0.0 0.0 - 0.5 K/uL    Basophils Relative 1 %    Basophils Absolute 0.0 0.0 - 0.1 K/uL    Immature Granulocytes 1 %    Abs Immature Granulocytes 0.05 0.00 - 0.07 K/uL      Comment: Performed at [facility], [address] [place], [place] [address]  Resp panel by  RT-PCR (RSV, Flu A&B, Covid) Anterior Nasal Swab     Status: Abnormal    Collection Time: 12/30/23  2:02 AM    Specimen: Anterior Nasal Swab  Result Value Ref Range    SARS Coronavirus 2 by RT PCR POSITIVE (A) NEGATIVE    Influenza A by PCR NEGATIVE NEGATIVE    Influenza B by PCR NEGATIVE NEGATIVE      Comment: (NOTE) The Xpert Xpress SARS-CoV-2/FLU/RSV plus assay is intended as an aid in the diagnosis of influenza from Nasopharyngeal swab specimens and should not be used as a sole basis for treatment. Nasal washings and aspirates are unacceptable for Xpert Xpress SARS-CoV-2/FLU/RSV testing.   Fact Sheet for Patients: bloggercourse.com   Fact Sheet for Healthcare Providers: seriousbroker.it   This test is not yet approved or cleared by the [place]  FDA and has been authorized for detection and/or diagnosis of SARS-CoV-2 by FDA under an Emergency Use Authorization (EUA). This EUA will remain in effect (meaning this test can be used) for the duration of the COVID-19 declaration under Section 564(b)(1) of the Act, 21 U.S.C. section 360bbb-3(b)(1), unless the authorization is terminated  or revoked.        Resp Syncytial Virus by PCR NEGATIVE NEGATIVE      Comment: (NOTE) Fact Sheet for Patients: bloggercourse.com   Fact Sheet for Healthcare Providers: seriousbroker.it   This test is not yet approved or cleared by the [place]  FDA and has been authorized for detection and/or diagnosis of SARS-CoV-2 by FDA under an Emergency Use Authorization (EUA). This EUA will remain in effect (meaning this test can be used) for the duration of the COVID-19 declaration under Section 564(b)(1) of the Act, 21 U.S.C. section 360bbb-3(b)(1), unless the authorization is terminated or revoked.   Performed at [facility], [address], [place], [place] [address]    Type and screen  [facility]     Status: None    Collection Time: 12/30/23  7:30 AM  Result Value Ref Range    ABO/RH(D) A POS      Antibody Screen NEG      Sample Expiration          01/02/2024,2359 Performed at [facility], [address] [place], [place] [address]    Magnesium      Status: None    Collection Time: 12/30/23  7:50 AM  Result Value Ref Range    Magnesium  2.0 1.7 - 2.4 mg/dL      Comment: Performed at [facility], [address] [place], [place] [address]  Protime-INR     Status: None    Collection Time: 12/30/23  7:50 AM  Result Value Ref Range    Prothrombin Time 15.1 11.4 - 15.2 seconds    INR 1.1 0.8 - 1.2      Comment: (NOTE) INR goal varies based on device and disease states. Performed at [facility], [address], [place], [place] [address]           Imaging Results (Last 48 hours)  CT Cervical Spine Wo Contrast Result Date: 12/30/2023 EXAM: CT CERVICAL SPINE WITHOUT CONTRAST 12/30/2023 02:10:00 AM TECHNIQUE: CT of the cervical spine was performed without the administration of intravenous contrast. Multiplanar reformatted images are provided for review. Automated exposure control, iterative reconstruction, and/or weight based adjustment of the mA/kV was utilized to reduce the radiation dose to as low as reasonably achievable. COMPARISON: None available. CLINICAL HISTORY: Neck trauma (Age >= 65y). FINDINGS: CERVICAL SPINE: BONES AND ALIGNMENT: No acute fracture or traumatic malalignment. DEGENERATIVE CHANGES: Disc space narrowing and endplate remodeling at C5-C6 in keeping with mild degenerative disc disease. Multilevel uncovertebral and facet arthrosis results in multilevel moderate-to-severe neuroforaminal narrowing, most severe on the right at C5-C6 and on the left at C3-C4. SOFT TISSUES: No prevertebral soft tissue swelling. IMPRESSION: 1. No acute abnormality of the cervical spine related to the reported neck trauma. 2. Mild  degenerative disc disease at C5-6 with multilevel uncovertebral and facet arthrosis resulting in multilevel moderate-to-severe neuroforaminal narrowing, most severe on the right at C6 and on the left at C3-4. Electronically signed by: Dorethia Molt MD 12/30/2023 02:23 AM EST RP Workstation: [id number]    CT Head Wo Contrast Result Date: 12/30/2023 EXAM: CT HEAD WITHOUT CONTRAST 12/30/2023 02:10:00 AM TECHNIQUE: CT of the head was performed without the administration of intravenous contrast. Automated exposure control, iterative reconstruction, and/or weight based adjustment of the mA/kV was utilized to reduce the radiation dose to as low as reasonably achievable. COMPARISON: 02/15/2023 CLINICAL HISTORY: Head trauma, minor (Age >= 65y) FINDINGS: BRAIN AND VENTRICLES: No acute hemorrhage. No  evidence of acute infarct. No hydrocephalus. No extra-axial collection. No mass effect or midline shift. Global cortical atrophy, cerebellar predominant. Subcortical and periventricular small vessel ischemic changes. ORBITS: No acute abnormality. SINUSES: No acute abnormality. SOFT TISSUES AND SKULL: No acute soft tissue abnormality. No skull fracture. IMPRESSION: 1. No acute intracranial abnormality. Electronically signed by: Pinkie Pebbles MD 12/30/2023 02:19 AM EST RP Workstation: [id number]    DG Hip Unilat W or Wo Pelvis 2-3 Views Right Result Date: 12/30/2023 EXAM: 3 VIEW(S) XRAY OF THE UNILATERAL HIP 12/30/2023 01:30:00 AM COMPARISON: None available. CLINICAL HISTORY: fall FINDINGS: BONES AND JOINTS: Left hip arthroplasty hardware in place. Acute comminuted intertrochanteric fracture of the right femur with associated varus angulation. Mildly displaced lesser trochanteric fragment. SOFT TISSUES: Right lower quadrant ostomy noted. IMPRESSION: 1. Acute comminuted intertrochanteric fracture of the right femur . Electronically signed by: Pinkie Pebbles MD 12/30/2023 01:35 AM EST RP Workstation: [id number]    DG  Chest Port 1 View Result Date: 12/30/2023 EXAM: 1 VIEW(S) XRAY OF THE CHEST 12/30/2023 01:30:00 AM COMPARISON: 08/15/2023 CLINICAL HISTORY: fall FINDINGS: LUNGS AND PLEURA: No focal pulmonary opacity. No pleural effusion. No pneumothorax. HEART AND MEDIASTINUM: No acute abnormality of the cardiac and mediastinal silhouettes. BONES AND SOFT TISSUES: No acute osseous abnormality. IMPRESSION: 1. No acute cardiopulmonary process. Electronically signed by: Pinkie Pebbles MD 12/30/2023 01:34 AM EST RP Workstation: [id number]       Review of Systems Blood pressure (!) 110/54, pulse 98, temperature 98.4 F (36.9 C), temperature source Oral, resp. rate 16, SpO2 98%. Physical Exam Constitutional:      Appearance: He is normal weight.  HENT:     Head: Normocephalic and atraumatic.     Nose: Nose normal.  Eyes:     Extraocular Movements: Extraocular movements intact.     Pupils: Pupils are equal, round, and reactive to light.  Cardiovascular:     Rate and Rhythm: Normal rate.     Pulses: Normal pulses.  Pulmonary:     Effort: Pulmonary effort is normal.  Abdominal:     General: Abdomen is flat.     Palpations: Abdomen is soft.  Musculoskeletal:     Cervical back: Neck supple.     Comments:   Pain with passive range of motion of the right hip is noted  Skin:    General: Skin is warm and dry.     Capillary Refill: Capillary refill takes less than 2 seconds.  Neurological:     General: No focal deficit present.     Mental Status: He is alert.       Assessment/Plan: Patient is noted to have an intertrochanteric right hip fracture.   I did discuss the patient's situation with his daughter [name].  She is his power of attorney.  I did discuss that given the unstable nature of the hip fracture, intramedullary fixation is indicated.  We did discuss the nature of the procedure, as well as the risks of surgery, including infection, neurovascular injury, nonunion, and malunion, and the possible  need for additional surgery.  Will go ahead and get surgery set up at our earliest convenience.   It was explained to the daughter that the patient may need to stay at a rehabilitation facility until he is at a functional level that is amenable to him safely caring for himself at home with the assistance of his daughter.   All of their questions were answered.  Will proceed with surgery this morning.

## 2023-12-30 NOTE — ED Notes (Signed)
 Attempted to call transport.

## 2023-12-30 NOTE — Progress Notes (Addendum)
 Preoperative medical evaluation Charlanne dunker al. Estimated Risk Probability for Perioperative Myocardial Infarction or Cardiac Arrest: 3.06% estimated risk probability for perioperative myocardial infarction or cardiac arrest Chest X ray no acute cardiopulmonary process in a patient with acute COVID infection Echocardiogram August 2024 revealed an EF of 55 to 60% with grade 1 diastolic dysfunction, RV systolic function was low normal, there was aortic dilatation of the ascending aorta with normal valves.  Dr. Briana has previously examined the patient.  Patient is asymptomatic regarding his COVID.  As noted above chest x-ray is negative.  We do not plan to treat COVID infection at this time.  His room air sats are 95 to 100%  Recommend : No further workup

## 2023-12-31 ENCOUNTER — Inpatient Hospital Stay (HOSPITAL_COMMUNITY)

## 2023-12-31 DIAGNOSIS — U071 COVID-19: Secondary | ICD-10-CM | POA: Diagnosis not present

## 2023-12-31 DIAGNOSIS — S72001A Fracture of unspecified part of neck of right femur, initial encounter for closed fracture: Secondary | ICD-10-CM | POA: Diagnosis not present

## 2023-12-31 LAB — CBC
HCT: 21 % — ABNORMAL LOW (ref 39.0–52.0)
Hemoglobin: 7 g/dL — ABNORMAL LOW (ref 13.0–17.0)
MCH: 34.1 pg — ABNORMAL HIGH (ref 26.0–34.0)
MCHC: 33.3 g/dL (ref 30.0–36.0)
MCV: 102.4 fL — ABNORMAL HIGH (ref 80.0–100.0)
Platelets: 110 K/uL — ABNORMAL LOW (ref 150–400)
RBC: 2.05 MIL/uL — ABNORMAL LOW (ref 4.22–5.81)
RDW: 12.5 % (ref 11.5–15.5)
WBC: 11.5 K/uL — ABNORMAL HIGH (ref 4.0–10.5)
nRBC: 0 % (ref 0.0–0.2)

## 2023-12-31 LAB — BASIC METABOLIC PANEL WITH GFR
Anion gap: 16 — ABNORMAL HIGH (ref 5–15)
BUN: 55 mg/dL — ABNORMAL HIGH (ref 8–23)
CO2: 21 mmol/L — ABNORMAL LOW (ref 22–32)
Calcium: 7.6 mg/dL — ABNORMAL LOW (ref 8.9–10.3)
Chloride: 100 mmol/L (ref 98–111)
Creatinine, Ser: 2.19 mg/dL — ABNORMAL HIGH (ref 0.61–1.24)
GFR, Estimated: 29 mL/min — ABNORMAL LOW (ref >60)
Glucose, Bld: 155 mg/dL — ABNORMAL HIGH (ref 70–99)
Potassium: 5.3 mmol/L — ABNORMAL HIGH (ref 3.5–5.1)
Sodium: 137 mmol/L (ref 135–145)

## 2023-12-31 MED ORDER — SODIUM ZIRCONIUM CYCLOSILICATE 10 G PO PACK
10.0000 g | PACK | Freq: Once | ORAL | Status: AC
  Administered 2023-12-31: 10 g via ORAL
  Filled 2023-12-31: qty 1

## 2023-12-31 NOTE — Evaluation (Signed)
 Physical Therapy Evaluation Patient Details Name: [name] MRN: [id number] DOB: 31-Jan-1941 Today's Date: 12/31/2023  History of Present Illness  Patient is an 83 year old male,  s/p fall with intertrochanteric right hip fracture.  Underwent intramedullary fixation 12/30/23.  PMH significant for dementia, CRF, CAD, DDD, GERD.  Clinical Impression  Patient presents with dependencies in gait and transfers limited by pain, cognitive abilities and generalized weakness.  Anticipate steady but slow progress and feel patient would benefit from intensive inpatient post-acute therapy < 3 hours/day.          If plan is discharge home, recommend the following: Two people to help with bathing/dressing/bathroom;Two people to help with walking and/or transfers;Help with stairs or ramp for entrance;Assistance with cooking/housework;Assist for transportation;Direct supervision/assist for financial management;Direct supervision/assist for medications management   Can travel by private vehicle   No    Equipment Recommendations BSC/3in1 (has RW)  Recommendations for Other Services       Functional Status Assessment Patient has had a recent decline in their functional status and demonstrates the ability to make significant improvements in function in a reasonable and predictable amount of time.     Precautions / Restrictions Precautions Precautions: Fall Restrictions RLE Weight Bearing Per Provider Order: Weight bearing as tolerated      Mobility  Bed Mobility Overal bed mobility: Needs Assistance Bed Mobility: Supine to Sit, Sit to Supine     Supine to sit: Max assist Sit to supine: Max assist        Transfers Overall transfer level: Needs assistance Equipment used: Rolling walker (2 wheels) Transfers: Sit to/from Stand Sit to Stand: Mod assist                Ambulation/Gait               General Gait Details: unable due to pain  Stairs             Wheelchair Mobility     Tilt Bed    Modified Rankin (Stroke Patients Only)       Balance Overall balance assessment: Needs assistance Sitting-balance support: Bilateral upper extremity supported, Feet supported Sitting balance-Leahy Scale: Fair                                       Pertinent Vitals/Pain Pain Assessment Pain Assessment: Faces Faces Pain Scale: Hurts whole lot Pain Location: right hip Pain Descriptors / Indicators: Discomfort, Grimacing, Moaning Pain Intervention(s): Limited activity within patient's tolerance, Monitored during session    Home Living Family/patient expects to be discharged to:: Skilled nursing facility                   Additional Comments: [name] was present and provided info regarding pt's prior level of functioning and living situation, as the pt had some difficulty with memory/recall.    Prior Function Prior Level of Function : Needs assist  Cognitive Assist : Mobility (cognitive);ADLs (cognitive) Mobility (Cognitive): Intermittent cues   Physical Assist : ADLs (physical)     Mobility Comments: able to ambulate with RW with supervision       Extremity/Trunk Assessment   Upper Extremity Assessment Upper Extremity Assessment: Defer to OT evaluation    Lower Extremity Assessment Lower Extremity Assessment: RLE deficits/detail RLE: Unable to fully assess due to pain    Cervical / Trunk Assessment Cervical / Trunk Assessment: Kyphotic  Communication  Cognition Arousal: Alert Behavior During Therapy: WFL for tasks assessed/performed   PT - Cognitive impairments: History of cognitive impairments                                 Cueing       General Comments      Exercises     Assessment/Plan    PT Assessment Patient needs continued PT services  PT Problem List Decreased strength;Decreased range of motion;Decreased activity tolerance;Decreased balance;Decreased  mobility;Decreased cognition;Pain       PT Treatment Interventions DME instruction;Gait training;Functional mobility training;Therapeutic activities;Therapeutic exercise;Patient/family education    PT Goals (Current goals can be found in the Care Plan section)  Acute Rehab PT Goals Patient Stated Goal: Daughter:  get him into a rehab facility PT Goal Formulation: With family Time For Goal Achievement: 01/14/24 Potential to Achieve Goals: Good    Frequency Min 3X/week     Co-evaluation               AM-PAC PT 6 Clicks Mobility  Outcome Measure Help needed turning from your back to your side while in a flat bed without using bedrails?: A Lot Help needed moving from lying on your back to sitting on the side of a flat bed without using bedrails?: A Lot Help needed moving to and from a bed to a chair (including a wheelchair)?: A Lot Help needed standing up from a chair using your arms (e.g., wheelchair or bedside chair)?: A Lot Help needed to walk in hospital room?: Total Help needed climbing 3-5 steps with a railing? : Total 6 Click Score: 10    End of Session Equipment Utilized During Treatment: Gait belt Activity Tolerance: Patient limited by pain Patient left: in bed;with call bell/phone within reach;with bed alarm set;with family/visitor present Nurse Communication: Mobility status PT Visit Diagnosis: Unsteadiness on feet (R26.81);Other abnormalities of gait and mobility (R26.89);Repeated falls (R29.6);Muscle weakness (generalized) (M62.81);History of falling (Z91.81)    Time: 1430-1455 PT Time Calculation (min) (ACUTE ONLY): 25 min   Charges:   PT Evaluation $PT Eval Moderate Complexity: 1 Mod PT Treatments $Gait Training: 8-22 mins PT General Charges $$ ACUTE PT VISIT: 1 Visit         12/31/2023 Lebron, PT Acute Rehabilitation Services Office:  [phone]   Katharina Venus HERO 12/31/2023, 3:00 PM

## 2023-12-31 NOTE — Progress Notes (Signed)
 Transition of Care [facility]) - CAGE-AID Screening   Patient Details  Name: [name] MRN: [id number] Date of Birth: [birth year]   Darice [name], RN Trauma Response Nurse Phone Number: [phone] 12/31/2023, 4:35 PM      CAGE-AID Screening: Substance Abuse Screening unable to be completed due to: : (S) Patient unable to participate (pt is oriented to person only- does live with daughter, unable to answer questions)

## 2023-12-31 NOTE — Hospital Course (Addendum)
 [name] is a 83 y.o. male with medical history significant of CAD, hypothyroidism, hypertension, dyslipidemia, dementia with protein calorie malnutrition and failure to thrive, GERD, previous stage II sacral decubitus, CKD 3 who was sent to the ER after an unwitnessed fall in the bathroom resulting in right-sided hip deformity and pain with shortening and rotation.  Also was Positive for COVID and was symptomatic on 11/14. Orthopedic Surgery consulted and took the patient to the OR for Closed Reduction and Intramedullary Fixation of the Right Intertrochanteric Fx on 11/15. He is steadily improving and had to be transfused pRBCs but appears stable.  Can discharge to SNF now as the facility will quarantine the patient. He is medically stable and will need to see PCP, Orthopedic Surgery, and Cardiology in the outpatient setting and repeat CXR in 2-3 weeks.   Assessment and Plan:  Suspected Mechanical Unwitnessed Fall with Acute comminuted fracture of Right Femur: Orthopedic Surgery Consulted and took patient for Operative Intervention on 11/15. VTE Prophylaxis per Orthopedic Surgery and now on ASA 81 mg po BID. PT/OT to evaluate and recommending SNF. Pain Control and Bowel Regimen adjusted. IVF as below now stopped. Appears to have improved and appears stable to D/C to SNF; Vitamin D ,25-Hydroxy was 11.33. Start po Cholecalciferol  2000 international units Daily and getting 50,000 international units weekly as well. Follow up with Orthopedic Surgery within 1-2 weeks   Possible Syncopal Episode: Continue Telemetry Monitoring;  PT/OT are recommending SNF; Check Orthostatic VS in the AM. IVF w/ NS @ 75 mL/hr now stopped. Checked a Transthoracic Echocardiogram showed an EF of 40 to 45% with no regional wall motion abnormalities and grade 1 diastolic dysfunction with right ventricular systolic function being normal. Resumed po Furosemide  and then Torsemide    COVID-19 Infection: Patient was actually diagnosed  days prior to admission. Fevers have defervesced. He was on Paxlovid prior to admission which is not continued. -Supportive care and C/w Dexamethasone  6 mg Daily x 10 days. Inflammatory Marker Trend improving  Recent Labs    01/03/24 0510  DDIMER 2.77*  FERRITIN 52  LDH 166  CRP 0.7  Fibrinogen  improving and is now 150; ESR is now 1 Lab Results  Component Value Date   SARSCOV2NAA POSITIVE (A) 12/30/2023   SARSCOV2NAA NEGATIVE 09/14/2021   SARSCOV2NAA NEGATIVE 07/08/2020   SARSCOV2NAA NEGATIVE 07/06/2020  -Add Guaifenesin  1200 mg po BID will continue Albuterol  2.5 mg neb every 4 as needed for wheezing or shortness of breath -SNF and per [facility] can accept the patient and still quarantine. Medically Stable for D/C  Acute Respiratory Failure w/ Hypoxia in the setting of Above: Patient continued to be a little bit hypoxic and on ambulation did drop.  Now off on 2 L of supplemental oxygen and will continue flutter valve and incentive spirometer.  Repeat chest x-ray this AM showed New 7 mm nodular density projecting over the right lower lung, indeterminate; recommend dedicated upright PA and lateral chest radiograph for further evaluation. Repeat CXR yesterday showed New 7 mm nodular density projecting over the right lower lung, indeterminate; recommend dedicated upright PA and lateral chest radiograph for further evaluation.  Nodular Lung Density: Repeat CXR in 2-3 weeks   AKI on CKD Stage 3a / Elevated AG Metabolic Acidosis: B/L Cr is ~1.3. BUN/Cr Trend: Recent Labs  Lab 12/30/23 0053 12/31/23 0714 01/01/24 0414 01/02/24 0509 01/03/24 0510 01/04/24 1055 01/05/24 0547  BUN 35* 55* 59* 63* 60* 56* 56*  CREATININE 1.52* 2.19* 1.92* 1.81* 1.50* 1.54*  1.54*  - IV fluid hydration is now stopped and diuretics resumed. Elevated AG us  improved but still a slight MA as CO2 is20, anion gap of 13, chloride level 100. Avoid Nephrotoxic Medications, Contrast Dyes, Hypotension and  Dehydration to Ensure Adequate Renal Perfusion and will need to Renally Adjust Meds. CTM and Trend Renal Function carefully and repeat CMP in the AM   CAD/Essential Hypertension/Dyslipidemia: Continue Pravastatin  40 mg po at bedtime. Diuretics resumed and give a dose of po furosemide  and then change back to Torsemide    Hyperkalemia: K+ is now gone from 5.2 -> 4.9 -> 5.0 -> 4.8 -> 4.9. S/p 2 doses of 10 grams of Lokelma  x1. IVF now stopped and resumed diuretics. CTM and Trend and repeat CMP in the AM  Leukocytosis: Had resolved but now worsening again and in the setting of Surgery and steroid Demargination (on daily dexamethasone ). WBC went from 6.4 -> 11.5 -> 10.6 -> 9.9 -> 9.2 -> 13.7 -> 13.4. Change to Daily po Dexamethasone  to complete 10 days. Check Inflammatory Markers for COVID. CTM for S/Sx of Infection. CXR showed no acute cardiopulmonary disease. Repeat CBC in the AM  ABLA as a postoperative Cause superimposed on Macrocytic Anemia: Hgb/Hct went from 11.5/35.8 -> 7.0/21.0 -> 6.2/18.6  w/ MCV of 101.6 and is now gone to 8.6/25.8 -> 7.9/24.1 -> 7.9/22.9 -> 9.2/28.0 -> 8.9/27.5 w/ MCV of 97.9. Checked Anemia Panel and showed an iron  level of 18, UIBC of 192, TIBC of 210, saturation ratios of 9%, ferritin of 78, folate level greater than 20 and a vitamin B12 level of 566.  With drop in hemoglobin we will Type and Screen and transfuse 1 unit PRBCs CTM For S/Sx of Bleeding; No overt bleeding noted. Repeat CBC in the AM  Thrombocytopenia: Resolved. Plt Count went from 153 -> 110 -> 98 -> 100 -> 112 -> 182 -> 213. CTM for S/Sx of Bleeding; No overt bleeding noted. Repeat CBC in the AM   Dementia: Pleasant @ baseline. C/w Divalproex  250 mg po BID, Memantine  10 mg po BID, and Sertraline  25 mg po Daily; C/w Delirium Precautions  GERD/GI Prophylaxis: C/w PPI with Pantoprazole  40 mg po daily   Hypoalbuminemia: Patient's Albumin  Lvl was 2.6 -> 2.2 -> 2.5 -> 2.2 -> 2.6 -> 2.7. CTM and Trend and repeat CMP  in the AM  Protein Calorie Malnutrition/Failure to Thrive: Nutrition Consultation and now on Ensure Plus High Protein 237 mL po BID  Hx stage 2 Decubitus Ulcer:  Wound 12/30/23 0948 Pressure Injury Sacrum Medial;Upper Stage 1 -  Intact skin with non-blanchable redness of a localized area usually over a bony prominence. (Active)

## 2023-12-31 NOTE — Plan of Care (Signed)
  Problem: Respiratory: Goal: Will maintain a patent airway Outcome: Progressing   Problem: Activity: Goal: Risk for activity intolerance will decrease Outcome: Progressing   Problem: Elimination: Goal: Will not experience complications related to urinary retention Outcome: Progressing   Problem: Safety: Goal: Ability to remain free from injury will improve Outcome: Progressing   Problem: Skin Integrity: Goal: Risk for impaired skin integrity will decrease Outcome: Progressing

## 2023-12-31 NOTE — Progress Notes (Signed)
 Patient doing well  Patient denies pain presently.  Patient's daughter is at his bedside.   Physical Exam: Vitals:   12/31/23 0441 12/31/23 0900  BP: 101/83 (!) 109/44  Pulse: 66 68  Resp: 19 17  Temp: 97.9 F (36.6 C) 97.9 F (36.6 C)  SpO2: 92% 99%    Patient looks comfortable.  He does have full strength and sensation throughout his bilateral lower extremities. Patient's lower extremity alignment is symmetric  POD #1 s/p closed reduction and intramedullary fixation of right intertrochanteric fracture, doing well  Patient looks excellent.  His pain is well-controlled.  He will continue aching aspirin .  He can weight-bear as tolerated.  He will continue with hydrocodone  as needed, although his pain is currently very well-controlled, and likely amenable to.  Patient will be discharged either to home or to rehabilitation, pending physical therapy recommendations.

## 2023-12-31 NOTE — Progress Notes (Signed)
 PROGRESS NOTE    [name]  FMW:[id number] DOB: [birth year] DOA: 12/30/2023 PCP: Charlott Dorn LABOR, MD   Brief Narrative:  [name] is a 83 y.o. male with medical history significant of CAD, hypothyroidism, hypertension, dyslipidemia, dementia with protein calorie malnutrition and failure to thrive, GERD, previous stage II sacral decubitus, CKD 3 who was sent to the ER after an unwitnessed fall in the bathroom resulting in right-sided hip deformity and pain with shortening and rotation.  Also was Positive for COVID and was symptomatic on 11/14. Orthopedic Surgery consulted and took the patient to the OR for Closed Reduction and Intramedullary Fixation of the Right Intertrochanteric Fx.   Assessment and Plan:  Suspected Mechanical Unwitnessed Fall with Acute comminuted fracture of right femur: Orthopedic Surgery Consulted and took patient for Operative Intervention on 11/15. VTE Prophylaxis per Orthopedic Surgery and now on ASA 81 mg po BID. PT/OT to evaluate. Pain Control and Bowel Regimen. IVF as below   Possible syncopal episode: Continue Telemetry Monitoring; Will need PT/OT to evaluate; Check Orthostatic VS in the AM. IVF w/ NS @ 75 mL/hr. Checking a Transthoracic Echocardiogram   COVID-19 infection: Patient was actually diagnosed days prior to admission. Fevers have defervesced. He was on Paxlovid prior to admission which is not continued. -Supportive care and C/w Dexamethasone  6 mg Daily. Check Inflammatory Markers in the AM and repeat CXR in the AM  AKI on CKD Stage 3a / Elevated AG Metabolic Acidosis: B/L Cr is ~1.3. BUN/Cr Trend: Recent Labs  Lab 12/30/23 0053 12/31/23 0714  BUN 35* 55*  CREATININE 1.52* 2.19*  -C/w NS @ 75 mL/hr. Patient has a slight MA w/ a CO2 of 21, AG of 16 and Chloride Level of 100. Avoid Nephrotoxic Medications, Contrast Dyes, Hypotension and Dehydration to Ensure Adequate Renal Perfusion and will need to Renally Adjust Meds. CTM and Trend  Renal Function carefully and repeat CMP in the AM   CAD/Essential Hypertension/Dyslipidemia: Continue Pravastatin  40 mg po at bedtime. Diuretics on hold  Hyperkalemia: K+ is now 5.3. Give a dose of 10 grams of Lokelma x1. C/w IVF. CTM and Trend and repeat CMP in teh AM  Leukocytosis: In the setting of Surgery. WBC went from 6.4 -> 11.5. Check Inflammatory Markers for COVID. CTM for S/Sx of Infection. CXR showed no acute cardiopulmonary disease. Repeat CBC in the Am  ABLA as a postoperative Cause superimposed on Macrocytic Anemia: Hgb/Hct went from 11.5/35.8 -> 7.0/21.0 w/ MCV of 102.4. Check Anemia Panel in the AM. CTM For S/Sx of Bleeding; No overt bleeding noted. Repeat CBC in the AM  Thrombocytopenia: Plt Count went from 153 -> 110. CTM for S/Sx of Bleeding; No overt bleeding noted. Repeat CBC in the AM   Dementia: Pleasant @ baseline. C/w Divalproex  250 mg po BID, Memantine  10 mg po BID, and Sertraline 25 mg po Daily; Delirium Precautions  GERD/GI Prophylaxis: C/w PPI with Pantoprazole  40 mg po daily   Hypoalbuminemia: Patient's Albumin  Lvl was 2.6. CTM and Trend and repeat CMP in the AM  Protein Calorie Malnutrition/Failure to Thrive: Nutrition Consultation   Hx stage 2 Decubitus Ulcer:  Wound 12/30/23 0948 Pressure Injury Sacrum Medial;Upper Stage 1 -  Intact skin with non-blanchable redness of a localized area usually over a bony prominence. (Active)    DVT prophylaxis: SCDs Start: 12/30/23 0845    Code Status: Limited: Do not attempt resuscitation (DNR) -DNR-LIMITED -Do Not Intubate/DNI  Family Communication: D/w Daughter @ bedside  Disposition Plan:  Level of  care: Telemetry Status is: Inpatient Remains inpatient appropriate because: Needs Orthopedic Clearance and evaluation by PT/OT   Consultants:  Orthopedic Surgery  Procedures:  PROCEDURE by Dr. Oneil Priestly on 12/30/23: Closed reduction and intramedullary fixation of right intertrochanteric fracture    Antimicrobials:  Anti-infectives (From admission, onward)    None       Subjective: Seen and examined at bedside and was doing okay.  Wearing supplemental oxygen.  No nausea or vomiting.  Feels okay and denies any current pain.  Has not moved or been out of bed yet.  No other concerns or complaints at this time.  Objective: Vitals:   12/30/23 2056 12/31/23 0441 12/31/23 0900 12/31/23 1446  BP: 98/76 101/83 (!) 109/44 (!) 100/44  Pulse: 81 66 68 83  Resp: 19 19 17 16   Temp: 98.2 F (36.8 C) 97.9 F (36.6 C) 97.9 F (36.6 C) (!) 97.4 F (36.3 C)  TempSrc:   Oral   SpO2: 97% 92% 99% 97%  Weight:      Height:       No intake or output data in the 24 hours ending 12/31/23 2032 Filed Weights   12/30/23 1116  Weight: 73.9 kg   Examination: Physical Exam:  Constitutional: Chronically ill-appearing elderly Caucasian male in no acute distress Respiratory: Diminished to auscultation bilaterally with some coarse breath sounds, no wheezing, rales, rhonchi or crackles. Normal respiratory effort and patient is not tachypenic. No accessory muscle use.  Supplemental oxygen via nasal cannula Cardiovascular: RRR, no murmurs / rubs / gallops. S1 and S2 auscultated. No extremity edema.  Abdomen: Soft, non-tender, non-distended. Bowel sounds positive.  GU: Deferred. Musculoskeletal: No clubbing / cyanosis of digits/nails. No joint deformity upper and lower extremities.  Skin: No rashes, lesions, ulcers on the limited skin evaluation. No induration; Warm and dry.  Neurologic: CN 2-12 grossly intact with no focal deficits. Romberg sign and cerebellar reflexes not assessed.  Psychiatric: He is awake and alert but pleasantly demented  Data Reviewed: I have personally reviewed following labs and imaging studies  CBC: Recent Labs  Lab 12/30/23 0053 12/31/23 0714  WBC 6.4 11.5*  NEUTROABS 4.3  --   HGB 11.5* 7.0*  HCT 35.8* 21.0*  MCV 103.2* 102.4*  PLT 153 110*   Basic Metabolic  Panel: Recent Labs  Lab 12/30/23 0053 12/30/23 0750 12/31/23 0714  NA 136  --  137  K 4.4  --  5.3*  CL 100  --  100  CO2 22  --  21*  GLUCOSE 117*  --  155*  BUN 35*  --  55*  CREATININE 1.52*  --  2.19*  CALCIUM  8.1*  --  7.6*  MG  --  2.0  --    GFR: Estimated Creatinine Clearance: 26.2 mL/min (A) (by C-G formula based on SCr of 2.19 mg/dL (H)). Liver Function Tests: Recent Labs  Lab 12/30/23 0053  AST 23  ALT 17  ALKPHOS 74  BILITOT 0.4  PROT 5.8*  ALBUMIN  2.6*   No results for input(s): LIPASE, AMYLASE in the last 168 hours. No results for input(s): AMMONIA in the last 168 hours. Coagulation Profile: Recent Labs  Lab 12/30/23 0750  INR 1.1   Cardiac Enzymes: No results for input(s): CKTOTAL, CKMB, CKMBINDEX, TROPONINI in the last 168 hours. BNP (last 3 results) No results for input(s): PROBNP in the last 8760 hours. HbA1C: No results for input(s): HGBA1C in the last 72 hours. CBG: No results for input(s): GLUCAP in the last 168  hours. Lipid Profile: No results for input(s): CHOL, HDL, LDLCALC, TRIG, CHOLHDL, LDLDIRECT in the last 72 hours. Thyroid  Function Tests: No results for input(s): TSH, T4TOTAL, FREET4, T3FREE, THYROIDAB in the last 72 hours. Anemia Panel: No results for input(s): VITAMINB12, FOLATE, FERRITIN, TIBC, IRON , RETICCTPCT in the last 72 hours. Sepsis Labs: No results for input(s): PROCALCITON, LATICACIDVEN in the last 168 hours.  Recent Results (from the past 240 hours)  Resp panel by RT-PCR (RSV, Flu A&B, Covid) Anterior Nasal Swab     Status: Abnormal   Collection Time: 12/30/23  2:02 AM   Specimen: Anterior Nasal Swab  Result Value Ref Range Status   SARS Coronavirus 2 by RT PCR POSITIVE (A) NEGATIVE Final   Influenza A by PCR NEGATIVE NEGATIVE Final   Influenza B by PCR NEGATIVE NEGATIVE Final    Comment: (NOTE) The Xpert Xpress SARS-CoV-2/FLU/RSV plus assay is intended  as an aid in the diagnosis of influenza from Nasopharyngeal swab specimens and should not be used as a sole basis for treatment. Nasal washings and aspirates are unacceptable for Xpert Xpress SARS-CoV-2/FLU/RSV testing.  Fact Sheet for Patients: bloggercourse.com  Fact Sheet for Healthcare Providers: seriousbroker.it  This test is not yet approved or cleared by the [place]  FDA and has been authorized for detection and/or diagnosis of SARS-CoV-2 by FDA under an Emergency Use Authorization (EUA). This EUA will remain in effect (meaning this test can be used) for the duration of the COVID-19 declaration under Section 564(b)(1) of the Act, 21 U.S.C. section 360bbb-3(b)(1), unless the authorization is terminated or revoked.     Resp Syncytial Virus by PCR NEGATIVE NEGATIVE Final    Comment: (NOTE) Fact Sheet for Patients: bloggercourse.com  Fact Sheet for Healthcare Providers: seriousbroker.it  This test is not yet approved or cleared by the [place]  FDA and has been authorized for detection and/or diagnosis of SARS-CoV-2 by FDA under an Emergency Use Authorization (EUA). This EUA will remain in effect (meaning this test can be used) for the duration of the COVID-19 declaration under Section 564(b)(1) of the Act, 21 U.S.C. section 360bbb-3(b)(1), unless the authorization is terminated or revoked.  Performed at [facility], [address], [place], [place] [address]   Surgical pcr screen     Status: None   Collection Time: 12/30/23 11:02 AM   Specimen: Nasal Mucosa; Nasal Swab  Result Value Ref Range Status   MRSA, PCR NEGATIVE NEGATIVE Final   Staphylococcus aureus NEGATIVE NEGATIVE Final    Comment: (NOTE) The Xpert SA Assay (FDA approved for NASAL specimens in patients 85 years of age and older), is one component of a comprehensive surveillance program. It  is not intended to diagnose infection nor to guide or monitor treatment. Performed at [facility], [address] [place], [place] [address]     Radiology Studies: DG HIP UNILAT WITH PELVIS 2-3 VIEWS RIGHT Result Date: 12/30/2023 CLINICAL DATA:  [id number] Surgery, elective [id number] EXAM: DG HIP (WITH OR WITHOUT PELVIS) 2-3V RIGHT COMPARISON:  December 30, 2022 FINDINGS: Spot fluoroscopy images were obtained for surgical planning purposes. This demonstrates placement of an intramedullary rod with improved alignment of an inter trochanteric fracture. Osteopenia. Time: 175 seconds Dose: 49.99 mGy Please reference procedure report for further details. IMPRESSION: Spot fluoroscopy images obtained for surgical planning purposes. Electronically Signed   By: Corean Salter M.D.   On: 12/30/2023 13:31   DG C-Arm 1-60 Min-No Report Result Date: 12/30/2023 Fluoroscopy was utilized by the requesting physician.  No radiographic interpretation.   DG C-Arm 1-60 Min-No Report Result Date: 12/30/2023 Fluoroscopy was utilized by the requesting physician.  No radiographic interpretation.   CT Cervical Spine Wo Contrast Result Date: 12/30/2023 EXAM: CT CERVICAL SPINE WITHOUT CONTRAST 12/30/2023 02:10:00 AM TECHNIQUE: CT of the cervical spine was performed without the administration of intravenous contrast. Multiplanar reformatted images are provided for review. Automated exposure control, iterative reconstruction, and/or weight based adjustment of the mA/kV was utilized to reduce the radiation dose to as low as reasonably achievable. COMPARISON: None available. CLINICAL HISTORY: Neck trauma (Age >= 65y). FINDINGS: CERVICAL SPINE: BONES AND ALIGNMENT: No acute fracture or traumatic malalignment. DEGENERATIVE CHANGES: Disc space narrowing and endplate remodeling at C5-C6 in keeping with mild degenerative disc disease. Multilevel uncovertebral and facet arthrosis results in multilevel moderate-to-severe  neuroforaminal narrowing, most severe on the right at C5-C6 and on the left at C3-C4. SOFT TISSUES: No prevertebral soft tissue swelling. IMPRESSION: 1. No acute abnormality of the cervical spine related to the reported neck trauma. 2. Mild degenerative disc disease at C5-6 with multilevel uncovertebral and facet arthrosis resulting in multilevel moderate-to-severe neuroforaminal narrowing, most severe on the right at C6 and on the left at C3-4. Electronically signed by: Dorethia Molt MD 12/30/2023 02:23 AM EST RP Workstation: [id number]   CT Head Wo Contrast Result Date: 12/30/2023 EXAM: CT HEAD WITHOUT CONTRAST 12/30/2023 02:10:00 AM TECHNIQUE: CT of the head was performed without the administration of intravenous contrast. Automated exposure control, iterative reconstruction, and/or weight based adjustment of the mA/kV was utilized to reduce the radiation dose to as low as reasonably achievable. COMPARISON: 02/15/2023 CLINICAL HISTORY: Head trauma, minor (Age >= 65y) FINDINGS: BRAIN AND VENTRICLES: No acute hemorrhage. No evidence of acute infarct. No hydrocephalus. No extra-axial collection. No mass effect or midline shift. Global cortical atrophy, cerebellar predominant. Subcortical and periventricular small vessel ischemic changes. ORBITS: No acute abnormality. SINUSES: No acute abnormality. SOFT TISSUES AND SKULL: No acute soft tissue abnormality. No skull fracture. IMPRESSION: 1. No acute intracranial abnormality. Electronically signed by: Pinkie Pebbles MD 12/30/2023 02:19 AM EST RP Workstation: [id number]   DG Hip Unilat W or Wo Pelvis 2-3 Views Right Result Date: 12/30/2023 EXAM: 3 VIEW(S) XRAY OF THE UNILATERAL HIP 12/30/2023 01:30:00 AM COMPARISON: None available. CLINICAL HISTORY: fall FINDINGS: BONES AND JOINTS: Left hip arthroplasty hardware in place. Acute comminuted intertrochanteric fracture of the right femur with associated varus angulation. Mildly displaced lesser trochanteric  fragment. SOFT TISSUES: Right lower quadrant ostomy noted. IMPRESSION: 1. Acute comminuted intertrochanteric fracture of the right femur . Electronically signed by: Pinkie Pebbles MD 12/30/2023 01:35 AM EST RP Workstation: [id number]   DG Chest Port 1 View Result Date: 12/30/2023 EXAM: 1 VIEW(S) XRAY OF THE CHEST 12/30/2023 01:30:00 AM COMPARISON: 08/15/2023 CLINICAL HISTORY: fall FINDINGS: LUNGS AND PLEURA: No focal pulmonary opacity. No pleural effusion. No pneumothorax. HEART AND MEDIASTINUM: No acute abnormality of the cardiac and mediastinal silhouettes. BONES AND SOFT TISSUES: No acute osseous abnormality. IMPRESSION: 1. No acute cardiopulmonary process. Electronically signed by: Pinkie Pebbles MD 12/30/2023 01:34 AM EST RP Workstation: [id number]   Scheduled Meds:  aspirin  EC  81 mg Oral BID   dexamethasone  (DECADRON ) injection  6 mg Intravenous Daily   divalproex   250 mg Oral BID   loperamide   2 mg Oral Daily   memantine   10 mg Oral BID   methocarbamol   500-1,000 mg Oral QID   multivitamin with minerals  1 tablet Oral Q breakfast   pantoprazole   40 mg Oral QAC  breakfast   pravastatin   40 mg Oral QHS   sertraline  25 mg Oral Daily   sodium chloride  flush  3 mL Intravenous Q12H   sodium zirconium cyclosilicate  10 g Oral Once   Continuous Infusions:  sodium chloride  75 mL/hr at 12/30/23 1018    LOS: 1 day   Alejandro Marker, DO [facility] Available via Epic secure chat 7am-7pm After these hours, please refer to coverage provider listed on [email or web address] 12/31/2023, 8:32 PM

## 2024-01-01 ENCOUNTER — Inpatient Hospital Stay (HOSPITAL_COMMUNITY)

## 2024-01-01 DIAGNOSIS — D62 Acute posthemorrhagic anemia: Secondary | ICD-10-CM | POA: Diagnosis not present

## 2024-01-01 DIAGNOSIS — J9601 Acute respiratory failure with hypoxia: Secondary | ICD-10-CM

## 2024-01-01 DIAGNOSIS — R55 Syncope and collapse: Secondary | ICD-10-CM

## 2024-01-01 DIAGNOSIS — U071 COVID-19: Secondary | ICD-10-CM | POA: Diagnosis not present

## 2024-01-01 DIAGNOSIS — S72001A Fracture of unspecified part of neck of right femur, initial encounter for closed fracture: Secondary | ICD-10-CM | POA: Diagnosis not present

## 2024-01-01 LAB — RETICULOCYTES
Immature Retic Fract: 28.1 % — ABNORMAL HIGH (ref 2.3–15.9)
RBC.: 1.78 MIL/uL — ABNORMAL LOW (ref 4.22–5.81)
Retic Count, Absolute: 50.7 K/uL (ref 19.0–186.0)
Retic Ct Pct: 2.9 % (ref 0.4–3.1)

## 2024-01-01 LAB — COMPREHENSIVE METABOLIC PANEL WITH GFR
ALT: 12 U/L (ref 0–44)
AST: 23 U/L (ref 15–41)
Albumin: 2.2 g/dL — ABNORMAL LOW (ref 3.5–5.0)
Alkaline Phosphatase: 39 U/L (ref 38–126)
Anion gap: 10 (ref 5–15)
BUN: 59 mg/dL — ABNORMAL HIGH (ref 8–23)
CO2: 22 mmol/L (ref 22–32)
Calcium: 7.3 mg/dL — ABNORMAL LOW (ref 8.9–10.3)
Chloride: 105 mmol/L (ref 98–111)
Creatinine, Ser: 1.92 mg/dL — ABNORMAL HIGH (ref 0.61–1.24)
GFR, Estimated: 34 mL/min — ABNORMAL LOW (ref >60)
Glucose, Bld: 147 mg/dL — ABNORMAL HIGH (ref 70–99)
Potassium: 5.2 mmol/L — ABNORMAL HIGH (ref 3.5–5.1)
Sodium: 137 mmol/L (ref 135–145)
Total Bilirubin: 0.7 mg/dL (ref 0.0–1.2)
Total Protein: 4.7 g/dL — ABNORMAL LOW (ref 6.5–8.1)

## 2024-01-01 LAB — ECHOCARDIOGRAM COMPLETE
AR max vel: 3.2 cm2
AV Area VTI: 3.52 cm2
AV Area mean vel: 3.31 cm2
AV Mean grad: 2 mmHg
AV Peak grad: 2.8 mmHg
Ao pk vel: 0.83 m/s
Area-P 1/2: 3.19 cm2
Height: 72 in
S' Lateral: 3.2 cm
Weight: 2608 [oz_av]

## 2024-01-01 LAB — CBC WITH DIFFERENTIAL/PLATELET
Abs Immature Granulocytes: 0.08 K/uL — ABNORMAL HIGH (ref 0.00–0.07)
Basophils Absolute: 0 K/uL (ref 0.0–0.1)
Basophils Relative: 0 %
Eosinophils Absolute: 0 K/uL (ref 0.0–0.5)
Eosinophils Relative: 0 %
HCT: 18.6 % — ABNORMAL LOW (ref 39.0–52.0)
Hemoglobin: 6.2 g/dL — CL (ref 13.0–17.0)
Immature Granulocytes: 1 %
Lymphocytes Relative: 7 %
Lymphs Abs: 0.8 K/uL (ref 0.7–4.0)
MCH: 33.9 pg (ref 26.0–34.0)
MCHC: 33.3 g/dL (ref 30.0–36.0)
MCV: 101.6 fL — ABNORMAL HIGH (ref 80.0–100.0)
Monocytes Absolute: 0.5 K/uL (ref 0.1–1.0)
Monocytes Relative: 5 %
Neutro Abs: 9.2 K/uL — ABNORMAL HIGH (ref 1.7–7.7)
Neutrophils Relative %: 87 %
Platelets: 98 K/uL — ABNORMAL LOW (ref 150–400)
RBC: 1.83 MIL/uL — ABNORMAL LOW (ref 4.22–5.81)
RDW: 12.6 % (ref 11.5–15.5)
WBC: 10.6 K/uL — ABNORMAL HIGH (ref 4.0–10.5)
nRBC: 0 % (ref 0.0–0.2)

## 2024-01-01 LAB — IRON AND TIBC
Iron: 18 ug/dL — ABNORMAL LOW (ref 45–182)
Saturation Ratios: 9 % — ABNORMAL LOW (ref 17.9–39.5)
TIBC: 210 ug/dL — ABNORMAL LOW (ref 250–450)
UIBC: 192 ug/dL

## 2024-01-01 LAB — C-REACTIVE PROTEIN: CRP: 2.8 mg/dL — ABNORMAL HIGH (ref <1.0)

## 2024-01-01 LAB — HEMOGLOBIN AND HEMATOCRIT, BLOOD
HCT: 25.8 % — ABNORMAL LOW (ref 39.0–52.0)
Hemoglobin: 8.6 g/dL — ABNORMAL LOW (ref 13.0–17.0)

## 2024-01-01 LAB — FERRITIN: Ferritin: 78 ng/mL (ref 24–336)

## 2024-01-01 LAB — PREPARE RBC (CROSSMATCH)

## 2024-01-01 LAB — LACTATE DEHYDROGENASE: LDH: 161 U/L (ref 105–235)

## 2024-01-01 LAB — SEDIMENTATION RATE: Sed Rate: 17 mm/h — ABNORMAL HIGH (ref 0–16)

## 2024-01-01 LAB — FIBRINOGEN: Fibrinogen: 216 mg/dL (ref 210–475)

## 2024-01-01 LAB — D-DIMER, QUANTITATIVE: D-Dimer, Quant: 7.76 ug{FEU}/mL — ABNORMAL HIGH (ref 0.00–0.50)

## 2024-01-01 LAB — VITAMIN B12: Vitamin B-12: 566 pg/mL (ref 180–914)

## 2024-01-01 LAB — PHOSPHORUS: Phosphorus: 5.6 mg/dL — ABNORMAL HIGH (ref 2.5–4.6)

## 2024-01-01 LAB — MAGNESIUM: Magnesium: 2.2 mg/dL (ref 1.7–2.4)

## 2024-01-01 LAB — FOLATE: Folate: >20 ng/mL (ref >5.9)

## 2024-01-01 MED ORDER — SODIUM CHLORIDE 0.9% IV SOLUTION: Freq: Once | INTRAVENOUS | Status: DC

## 2024-01-01 MED ORDER — SODIUM ZIRCONIUM CYCLOSILICATE 10 G PO PACK
10.0000 g | PACK | Freq: Once | ORAL | Status: AC
  Administered 2024-01-01: 10 g via ORAL
  Filled 2024-01-01: qty 1

## 2024-01-01 MED ORDER — GUAIFENESIN ER 600 MG PO TB12
1200.0000 mg | ORAL_TABLET | Freq: Two times a day (BID) | ORAL | Status: DC
  Administered 2024-01-01 – 2024-01-05 (×8): 1200 mg via ORAL
  Filled 2024-01-01 (×8): qty 2

## 2024-01-01 MED ORDER — ENSURE PLUS HIGH PROTEIN PO LIQD
237.0000 mL | Freq: Two times a day (BID) | ORAL | Status: DC
  Administered 2024-01-01 – 2024-01-05 (×8): 237 mL via ORAL

## 2024-01-01 NOTE — Anesthesia Postprocedure Evaluation (Signed)
 Anesthesia Post Note  Patient: [name]  Procedure(s) Performed: FIXATION, FRACTURE, INTERTROCHANTERIC, WITH INTRAMEDULLARY ROD (Right)     Patient location during evaluation: PACU Anesthesia Type: General Level of consciousness: awake and alert Pain management: pain level controlled Vital Signs Assessment: post-procedure vital signs reviewed and stable Respiratory status: spontaneous breathing, nonlabored ventilation, respiratory function stable and patient connected to nasal cannula oxygen Cardiovascular status: blood pressure returned to baseline and stable Postop Assessment: no apparent nausea or vomiting Anesthetic complications: no   No notable events documented.  Last Vitals:  Vitals:   01/01/24 0646 01/01/24 0705  BP: (!) 113/52 (!) 109/54  Pulse: (!) 58 (!) 58  Resp: 16 16  Temp: 36.4 C 36.5 C  SpO2: 91% 100%    Last Pain:  Vitals:   01/01/24 0705  TempSrc: Oral  PainSc:                  [name]

## 2024-01-01 NOTE — Progress Notes (Addendum)
 Critical lab hemoglobin 6.2, MD notified, new orders in chart

## 2024-01-01 NOTE — Progress Notes (Signed)
 PROGRESS NOTE    [name]  FMW:[id number] DOB: [birth year] DOA: 12/30/2023 PCP: Charlott Dorn LABOR, MD   Brief Narrative:  [name] is a 83 y.o. male with medical history significant of CAD, hypothyroidism, hypertension, dyslipidemia, dementia with protein calorie malnutrition and failure to thrive, GERD, previous stage II sacral decubitus, CKD 3 who was sent to the ER after an unwitnessed fall in the bathroom resulting in right-sided hip deformity and pain with shortening and rotation.  Also was Positive for COVID and was symptomatic on 11/14. Orthopedic Surgery consulted and took the patient to the OR for Closed Reduction and Intramedullary Fixation of the Right Intertrochanteric Fx.   Assessment and Plan:  Suspected Mechanical Unwitnessed Fall with Acute comminuted fracture of right femur: Orthopedic Surgery Consulted and took patient for Operative Intervention on 11/15. VTE Prophylaxis per Orthopedic Surgery and now on ASA 81 mg po BID. PT/OT to evaluate and recommending SNF. Pain Control and Bowel Regimen. IVF as below now stopped.    Possible syncopal episode: Continue Telemetry Monitoring;  PT/OT are recommending SNF; Check Orthostatic VS in the AM. IVF w/ NS @ 75 mL/hr now stopped. Checked a Transthoracic Echocardiogram showed an EF of 40 to 45% with no regional wall motion abnormalities and grade 1 diastolic dysfunction with right ventricular systolic function being normal.   COVID-19 infection: Patient was actually diagnosed days prior to admission. Fevers have defervesced. He was on Paxlovid prior to admission which is not continued. -Supportive care and C/w Dexamethasone  6 mg Daily. Inflammatory Marker Trend  Recent Labs    01/01/24 0414  DDIMER 7.76*  FERRITIN 78  LDH 161  CRP 2.8*   Lab Results  Component Value Date   SARSCOV2NAA POSITIVE (A) 12/30/2023   SARSCOV2NAA NEGATIVE 09/14/2021   SARSCOV2NAA NEGATIVE 07/08/2020   SARSCOV2NAA NEGATIVE 07/06/2020   -Add Guaifenesin  1200 mg po BID will continue Albuterol  2.5 mg neb every 4 as needed for wheezing or shortness of breath  Acute Respiratory Failure w/ Hypoxia in the setting of Above: Patient continues to be a little bit hypoxic and on ambulation did drop.  Remains on 2 L of supplemental oxygen and will continue flutter valve and incentive spirometer.  Repeat chest x-ray in AM.  Chest x-ray today showed Hyperinflated lungs with chronic interstitial changes compatible with emphysema and diffuse bronchial wall thickening.  AKI on CKD Stage 3a / Elevated AG Metabolic Acidosis: B/L Cr is ~1.3. BUN/Cr Trend: Recent Labs  Lab 12/30/23 0053 12/31/23 0714 01/01/24 0414  BUN 35* 55* 59*  CREATININE 1.52* 2.19* 1.92*  - IV fluid hydration is now stopped.metabolic acidosis is improved and he has a CO2 22, anion gap of 10, chloride level 105. Avoid Nephrotoxic Medications, Contrast Dyes, Hypotension and Dehydration to Ensure Adequate Renal Perfusion and will need to Renally Adjust Meds. CTM and Trend Renal Function carefully and repeat CMP in the AM   CAD/Essential Hypertension/Dyslipidemia: Continue Pravastatin  40 mg po at bedtime. Diuretics on hold  Hyperkalemia: K+ is now 5.2. Give another dose of 10 grams of Lokelma x1. C/w IVF. CTM and Trend and repeat CMP in teh AM  Leukocytosis: In the setting of Surgery and steroid Demargination. WBC went from 6.4 -> 11.5 -> 10.6. Check Inflammatory Markers for COVID. CTM for S/Sx of Infection. CXR showed no acute cardiopulmonary disease. Repeat CBC in the AM  ABLA as a postoperative Cause superimposed on Macrocytic Anemia: Hgb/Hct went from 11.5/35.8 -> 7.0/21.0 -> 6.2/18.6  w/ MCV of 101.6. Checked Anemia  Panel and showed an iron  level of 18, UIBC of 192, TIBC of 210, saturation ratios of 9%, ferritin of 78, folate level greater than 20 and a vitamin B12 level of 566.  With drop in hemoglobin we will tapestry and transfuse 1 unit PRBCs CTM For S/Sx of Bleeding;  No overt bleeding noted. Repeat CBC in the AM  Thrombocytopenia: Plt Count went from 153 -> 110 -> 98. CTM for S/Sx of Bleeding; No overt bleeding noted. Repeat CBC in the AM   Dementia: Pleasant @ baseline. C/w Divalproex  250 mg po BID, Memantine  10 mg po BID, and Sertraline 25 mg po Daily; Delirium Precautions  GERD/GI Prophylaxis: C/w PPI with Pantoprazole  40 mg po daily   Hypoalbuminemia: Patient's Albumin  Lvl was 2.6 -> 2.2. CTM and Trend and repeat CMP in the AM  Protein Calorie Malnutrition/Failure to Thrive: Nutrition Consultation   Hx stage 2 Decubitus Ulcer:  Wound 12/30/23 0948 Pressure Injury Sacrum Medial;Upper Stage 1 -  Intact skin with non-blanchable redness of a localized area usually over a bony prominence. (Active)    DVT prophylaxis: SCDs Start: 12/30/23 0845    Code Status: Limited: Do not attempt resuscitation (DNR) -DNR-LIMITED -Do Not Intubate/DNI  Family Communication: D/w Daughter @ bedside   Disposition Plan:  Level of care: Telemetry Status is: Inpatient Remains inpatient appropriate because: Needs SNF and clearance by the specialist.  Echo will need to be isolated for SNF per their protocol  Consultants:  Orthopedic Surgery  Procedures:  As delineated as above; Echocardiogram  Antimicrobials:  Anti-infectives (From admission, onward)    None       Subjective: Seen and examined at bedside and was a little short of breath still.  No nausea vomiting.  Unable to really cough up sputum.  Denies any chest pain.  Was in pain today especially with moving and ambulating.  No other concerns or complaints at this time.  Objective: Vitals:   01/01/24 0705 01/01/24 0724 01/01/24 1021 01/01/24 1343  BP: (!) 109/54 103/64 124/61 (!) 90/33  Pulse: (!) 58 (!) 51 (!) 53 78  Resp: 16 17 16 16   Temp: 97.7 F (36.5 C) 97.8 F (36.6 C) (!) 97.5 F (36.4 C)   TempSrc: Oral Oral Oral   SpO2: 100% 100% 96% 93%  Weight:      Height:        Intake/Output  Summary (Last 24 hours) at 01/01/2024 2001 Last data filed at 01/01/2024 1800 Gross per 24 hour  Intake 737 ml  Output 950 ml  Net -213 ml   Filed Weights   12/30/23 1116  Weight: 73.9 kg   Examination: Physical Exam:  Constitutional: Chronically ill-appearing elderly Caucasian male in no acute distress Respiratory: Diminished to auscultation bilaterally with some coarse breath sounds and has some slight rhonchi but no appreciable wheezing or rales or crackles.  Wearing supplemental oxygen via nasal cannula but does not have any increased respiratory effort Cardiovascular: RRR, no murmurs / rubs / gallops. S1 and S2 auscultated.  No appreciable extremity edema Abdomen: Soft, non-tender, non-distended. Bowel sounds positive.  GU: Deferred. Musculoskeletal: No clubbing / cyanosis of digits/nails. No joint deformity upper and lower extremities.  Skin: No rashes, lesions, ulcers on limited skin evaluation. No induration; Warm and dry.  Neurologic: CN 2-12 grossly intact with no focal deficits. Romberg sign and cerebellar reflexes not assessed.  Psychiatric: Normal judgment and insight. Alert and oriented x 3. Normal mood and appropriate affect.   Data Reviewed: I have personally  reviewed following labs and imaging studies  CBC: Recent Labs  Lab 12/30/23 0053 12/31/23 0714 01/01/24 0414  WBC 6.4 11.5* 10.6*  NEUTROABS 4.3  --  9.2*  HGB 11.5* 7.0* 6.2*  HCT 35.8* 21.0* 18.6*  MCV 103.2* 102.4* 101.6*  PLT 153 110* 98*   Basic Metabolic Panel: Recent Labs  Lab 12/30/23 0053 12/30/23 0750 12/31/23 0714 01/01/24 0414  NA 136  --  137 137  K 4.4  --  5.3* 5.2*  CL 100  --  100 105  CO2 22  --  21* 22  GLUCOSE 117*  --  155* 147*  BUN 35*  --  55* 59*  CREATININE 1.52*  --  2.19* 1.92*  CALCIUM  8.1*  --  7.6* 7.3*  MG  --  2.0  --  2.2  PHOS  --   --   --  5.6*   GFR: Estimated Creatinine Clearance: 29.9 mL/min (A) (by C-G formula based on SCr of 1.92 mg/dL  (H)). Liver Function Tests: Recent Labs  Lab 12/30/23 0053 01/01/24 0414  AST 23 23  ALT 17 12  ALKPHOS 74 39  BILITOT 0.4 0.7  PROT 5.8* 4.7*  ALBUMIN  2.6* 2.2*   No results for input(s): LIPASE, AMYLASE in the last 168 hours. No results for input(s): AMMONIA in the last 168 hours. Coagulation Profile: Recent Labs  Lab 12/30/23 0750  INR 1.1   Cardiac Enzymes: No results for input(s): CKTOTAL, CKMB, CKMBINDEX, TROPONINI in the last 168 hours. BNP (last 3 results) No results for input(s): PROBNP in the last 8760 hours. HbA1C: No results for input(s): HGBA1C in the last 72 hours. CBG: No results for input(s): GLUCAP in the last 168 hours. Lipid Profile: No results for input(s): CHOL, HDL, LDLCALC, TRIG, CHOLHDL, LDLDIRECT in the last 72 hours. Thyroid  Function Tests: No results for input(s): TSH, T4TOTAL, FREET4, T3FREE, THYROIDAB in the last 72 hours. Anemia Panel: Recent Labs    01/01/24 0414  VITAMINB12 566  FOLATE >20.0  FERRITIN 78  TIBC 210*  IRON  18*  RETICCTPCT 2.9   Sepsis Labs: No results for input(s): PROCALCITON, LATICACIDVEN in the last 168 hours.  Recent Results (from the past 240 hours)  Resp panel by RT-PCR (RSV, Flu A&B, Covid) Anterior Nasal Swab     Status: Abnormal   Collection Time: 12/30/23  2:02 AM   Specimen: Anterior Nasal Swab  Result Value Ref Range Status   SARS Coronavirus 2 by RT PCR POSITIVE (A) NEGATIVE Final   Influenza A by PCR NEGATIVE NEGATIVE Final   Influenza B by PCR NEGATIVE NEGATIVE Final    Comment: (NOTE) The Xpert Xpress SARS-CoV-2/FLU/RSV plus assay is intended as an aid in the diagnosis of influenza from Nasopharyngeal swab specimens and should not be used as a sole basis for treatment. Nasal washings and aspirates are unacceptable for Xpert Xpress SARS-CoV-2/FLU/RSV testing.  Fact Sheet for Patients: bloggercourse.com  Fact Sheet for  Healthcare Providers: seriousbroker.it  This test is not yet approved or cleared by the [place]  FDA and has been authorized for detection and/or diagnosis of SARS-CoV-2 by FDA under an Emergency Use Authorization (EUA). This EUA will remain in effect (meaning this test can be used) for the duration of the COVID-19 declaration under Section 564(b)(1) of the Act, 21 U.S.C. section 360bbb-3(b)(1), unless the authorization is terminated or revoked.     Resp Syncytial Virus by PCR NEGATIVE NEGATIVE Final    Comment: (NOTE) Fact Sheet for Patients: bloggercourse.com  Fact  Sheet for Healthcare Providers: seriousbroker.it  This test is not yet approved or cleared by the [place]  FDA and has been authorized for detection and/or diagnosis of SARS-CoV-2 by FDA under an Emergency Use Authorization (EUA). This EUA will remain in effect (meaning this test can be used) for the duration of the COVID-19 declaration under Section 564(b)(1) of the Act, 21 U.S.C. section 360bbb-3(b)(1), unless the authorization is terminated or revoked.  Performed at [facility], [address], [place], [place] [address]   Surgical pcr screen     Status: None   Collection Time: 12/30/23 11:02 AM   Specimen: Nasal Mucosa; Nasal Swab  Result Value Ref Range Status   MRSA, PCR NEGATIVE NEGATIVE Final   Staphylococcus aureus NEGATIVE NEGATIVE Final    Comment: (NOTE) The Xpert SA Assay (FDA approved for NASAL specimens in patients 44 years of age and older), is one component of a comprehensive surveillance program. It is not intended to diagnose infection nor to guide or monitor treatment. Performed at [facility], [address], [place], [place] [address]     Radiology Studies: ECHOCARDIOGRAM COMPLETE Result Date: 01/01/2024    ECHOCARDIOGRAM REPORT   Patient Name:   [name] Date of Exam: 01/01/2024  Medical Rec #:  [id number]       Height:       72.0 in Accession #:    [id number]      Weight:       163.0 lb Date of Birth:  March 04, 1940      BSA:          1.953 m Patient Age:    83 years        BP:           121/61 mmHg Patient Gender: M               HR:           63 bpm. Exam Location:  Inpatient Procedure: 2D Echo, Cardiac Doppler and Color Doppler (Both Spectral and Color            Flow Doppler were utilized during procedure). Indications:    Syncope R55  History:        Patient has prior history of Echocardiogram examinations, most                 recent 09/19/2022. CAD; Risk Factors:Hypertension.  Sonographer:    Jayson Gaskins Referring Phys: [phone] Daltin A NETTEY IMPRESSIONS  1. Left ventricular ejection fraction, by estimation, is 45 to 50%. The left ventricle has mildly decreased function. The left ventricle has no regional wall motion abnormalities. Left ventricular diastolic parameters are consistent with Grade I diastolic dysfunction (impaired relaxation).  2. Right ventricular systolic function is normal. The right ventricular size is normal.  3. The mitral valve is normal in structure. No evidence of mitral valve regurgitation. No evidence of mitral stenosis.  4. The aortic valve is normal in structure. Aortic valve regurgitation is not visualized. No aortic stenosis is present.  5. The inferior vena cava is normal in size with greater than 50% respiratory variability, suggesting right atrial pressure of 3 mmHg. Comparison(s): No significant change from prior study. FINDINGS  Left Ventricle: Left ventricular ejection fraction, by estimation, is 45 to 50%. The left ventricle has mildly decreased function. The left ventricle has no regional wall motion abnormalities. The left ventricular internal cavity size was normal in size. There is no left ventricular hypertrophy. Left  ventricular diastolic parameters are consistent with Grade I diastolic dysfunction (impaired relaxation). Right Ventricle: The right  ventricular size is normal. No increase in right ventricular wall thickness. Right ventricular systolic function is normal. Left Atrium: Left atrial size was normal in size. Right Atrium: Right atrial size was normal in size. Pericardium: There is no evidence of pericardial effusion. Mitral Valve: The mitral valve is normal in structure. No evidence of mitral valve regurgitation. No evidence of mitral valve stenosis. Tricuspid Valve: The tricuspid valve is normal in structure. Tricuspid valve regurgitation is not demonstrated. No evidence of tricuspid stenosis. Aortic Valve: The aortic valve is normal in structure. Aortic valve regurgitation is not visualized. No aortic stenosis is present. Aortic valve mean gradient measures 2.0 mmHg. Aortic valve peak gradient measures 2.8 mmHg. Aortic valve area, by VTI measures 3.52 cm. Pulmonic Valve: The pulmonic valve was normal in structure. Pulmonic valve regurgitation is not visualized. No evidence of pulmonic stenosis. Aorta: The aortic root is normal in size and structure. Venous: The inferior vena cava is normal in size with greater than 50% respiratory variability, suggesting right atrial pressure of 3 mmHg. IAS/Shunts: No atrial level shunt detected by color flow Doppler.  LEFT VENTRICLE PLAX 2D LVIDd:         4.20 cm   Diastology LVIDs:         3.20 cm   LV e' medial:    6.42 cm/s LV PW:         0.80 cm   LV E/e' medial:  11.4 LV IVS:        0.90 cm   LV e' lateral:   3.81 cm/s LVOT diam:     2.00 cm   LV E/e' lateral: 19.3 LV SV:         62 LV SV Index:   32 LVOT Area:     3.14 cm  LEFT ATRIUM           Index LA Vol (A4C): 17.0 ml 8.70 ml/m  AORTIC VALVE AV Area (Vmax):    3.20 cm AV Area (Vmean):   3.31 cm AV Area (VTI):     3.52 cm AV Vmax:           83.10 cm/s AV Vmean:          58.100 cm/s AV VTI:            0.175 m AV Peak Grad:      2.8 mmHg AV Mean Grad:      2.0 mmHg LVOT Vmax:         84.60 cm/s LVOT Vmean:        61.300 cm/s LVOT VTI:          0.196  m LVOT/AV VTI ratio: 1.12  AORTA Ao Root diam: 3.00 cm MITRAL VALVE MV Area (PHT): 3.19 cm    SHUNTS MV Decel Time: 238 msec    Systemic VTI:  0.20 m MV E velocity: 73.40 cm/s  Systemic Diam: 2.00 cm MV A velocity: 99.00 cm/s MV E/A ratio:  0.74 Morene Brownie Electronically signed by Morene Brownie Signature Date/Time: 01/01/2024/1:22:51 PM    Final    DG CHEST PORT 1 VIEW Result Date: 01/01/2024 EXAM: 1 VIEW(S) XRAY OF THE CHEST 01/01/2024 05:59:34 AM COMPARISON: None available. CLINICAL HISTORY: [id number] COVID [id number] COVID FINDINGS: LUNGS AND PLEURA: Lungs appear hyperinflated with chronic interstitial changes compatible with emphysema. Diffuse bronchial wall thickening. No pleural effusion. No pneumothorax. HEART AND MEDIASTINUM: Heart size normal. Aortic  atherosclerosis. BONES AND SOFT TISSUES: No acute osseous abnormality. IMPRESSION: 1. Hyperinflated lungs with chronic interstitial changes compatible with emphysema and diffuse bronchial wall thickening. Electronically signed by: Taylor Stroud MD 01/01/2024 06:15 AM EST RP Workstation: [id number]   Scheduled Meds:  sodium chloride    Intravenous Once   aspirin  EC  81 mg Oral BID   dexamethasone  (DECADRON ) injection  6 mg Intravenous Daily   divalproex   250 mg Oral BID   feeding supplement  237 mL Oral BID BM   guaiFENesin   1,200 mg Oral BID   loperamide   2 mg Oral Daily   memantine   10 mg Oral BID   methocarbamol   500-1,000 mg Oral QID   multivitamin with minerals  1 tablet Oral Q breakfast   pantoprazole   40 mg Oral QAC breakfast   pravastatin   40 mg Oral QHS   sertraline  25 mg Oral Daily   sodium chloride  flush  3 mL Intravenous Q12H   Continuous Infusions:   LOS: 2 days   Alejandro Marker, DO [facility] Available via Epic secure chat 7am-7pm After these hours, please refer to coverage provider listed on [email or web address] 01/01/2024, 8:01 PM

## 2024-01-01 NOTE — Evaluation (Signed)
 Occupational Therapy Evaluation Patient Details Name: [name] MRN: [id number] DOB: 1940-02-26 Today's Date: 01/01/2024   History of Present Illness   Patient is an 83 year old male,  s/p fall with intertrochanteric right hip fracture.  Underwent intramedullary fixation 12/30/23.  PMH significant for dementia, CRF, CAD, DDD, GERD.     Clinical Impressions PTA Pt required assistance with ADLs and mobility. Pt currently requires up to Max A for ADL task engagement and Mod A +2 for functional transfer OOB and up to Max A for bed mobility. Pt is primarily limited by RLE pain, impaired cognition, and unsteadiness on feet. OT to continue to follow Pt acutely to facilitate progress towards goals. Patient will benefit from continued inpatient follow up therapy, <3 hours/day.      If plan is discharge home, recommend the following:   A lot of help with walking and/or transfers;A lot of help with bathing/dressing/bathroom;Assistance with cooking/housework;Assistance with feeding;Direct supervision/assist for medications management;Direct supervision/assist for financial management;Assist for transportation;Help with stairs or ramp for entrance;Supervision due to cognitive status     Functional Status Assessment   Patient has had a recent decline in their functional status and/or demonstrates limited ability to make significant improvements in function in a reasonable and predictable amount of time     Equipment Recommendations   Other (comment) (defer to next venue)     Recommendations for Other Services         Precautions/Restrictions   Precautions Precautions: Fall Recall of Precautions/Restrictions: Impaired Restrictions Weight Bearing Restrictions Per Provider Order: Yes RLE Weight Bearing Per Provider Order: Weight bearing as tolerated     Mobility Bed Mobility Overal bed mobility: Needs Assistance Bed Mobility: Supine to Sit     Supine to sit: Max assist,  +2 for physical assistance     General bed mobility comments: Pt required Max A +2 for bed mobility to manage BLE to elevate trunk from bed with use of bed pads. Pt able to maintain sitting balance EOB with close supervision once seated upright.    Transfers Overall transfer level: Needs assistance Equipment used: Rolling walker (2 wheels) Transfers: Sit to/from Stand, Bed to chair/wheelchair/BSC Sit to Stand: Mod assist, +2 physical assistance, From elevated surface     Step pivot transfers: Mod assist, +2 physical assistance     General transfer comment: Pt with three sit to stand attempts this date. Pt successful on first attempt with multimodal cues for proper hand placement and Mod A +2, but initiated return to sitting d/t pain. Pt unable to clear bed on second attempt and so bed height was elevated. Pt able to come to standing on third attempt with Mod A +2. Pt able to step pivot to recliner on L side with dense verbal cues for sequencing steps and management of RW.      Balance Overall balance assessment: Needs assistance Sitting-balance support: Bilateral upper extremity supported, Feet supported Sitting balance-Leahy Scale: Fair Sitting balance - Comments: Pt required close supervision to maintain sitting balance EOB.   Standing balance support: Bilateral upper extremity supported, During functional activity, Reliant on assistive device for balance Standing balance-Leahy Scale: Poor Standing balance comment: dependent on RW and external support                           ADL either performed or assessed with clinical judgement   ADL Overall ADL's : Needs assistance/impaired Eating/Feeding: Set up;Sitting   Grooming: Set up;Cueing for sequencing;Sitting  Upper Body Bathing: Minimal assistance;Sitting   Lower Body Bathing: Maximal assistance   Upper Body Dressing : Minimal assistance   Lower Body Dressing: Maximal assistance   Toilet Transfer: Moderate  assistance;+2 for physical assistance;Stand-pivot;BSC/3in1;Rolling walker (2 wheels)   Toileting- Clothing Manipulation and Hygiene: Total assistance               Vision   Vision Assessment?: No apparent visual deficits     Perception         Praxis         Pertinent Vitals/Pain Pain Assessment Pain Assessment: Faces Faces Pain Scale: Hurts whole lot Pain Location: RLE Pain Descriptors / Indicators: Discomfort, Grimacing, Moaning Pain Intervention(s): Limited activity within patient's tolerance, Monitored during session, Patient requesting pain meds-RN notified     Extremity/Trunk Assessment Upper Extremity Assessment Upper Extremity Assessment: Generalized weakness   Lower Extremity Assessment Lower Extremity Assessment: Defer to PT evaluation   Cervical / Trunk Assessment Cervical / Trunk Assessment: Kyphotic   Communication Communication Communication: Impaired Factors Affecting Communication: Reduced clarity of speech   Cognition Arousal: Alert Behavior During Therapy: WFL for tasks assessed/performed Cognition: History of cognitive impairments             OT - Cognition Comments: baseline dementia                 Following commands: Impaired Following commands impaired: Follows one step commands with increased time, Follows one step commands inconsistently     Cueing  General Comments   Cueing Techniques: Verbal cues;Visual cues;Gestural cues  Session completed on RA. SpO2 initially 81% following supine to sit. Pt guided through pursed lip breathing and with time was able to return to 93%. [place] placed back onto Pt at end of session with Pt seated upright in recliner. SpO2 returned to 95%.   Exercises     Shoulder Instructions      Home Living Family/patient expects to be discharged to:: Skilled nursing facility                                 Additional Comments: Per chart review, [name] was present and provided  info regarding pt's prior level of functioning and living situation, as the pt had some difficulty with memory/recall.      Prior Functioning/Environment Prior Level of Function : Needs assist  Cognitive Assist : Mobility (cognitive);ADLs (cognitive) Mobility (Cognitive): Intermittent cues ADLs (Cognitive): Intermittent cues Physical Assist : ADLs (physical)   ADLs (physical): Bathing;Dressing;IADLs Mobility Comments: able to ambulate with RW with supervision ADLs Comments: Per chart review from previous admission, Pt required occasional supervision for bathing, increased time for dressing tasks & occasional light assistance.    OT Problem List: Decreased strength;Decreased activity tolerance;Impaired balance (sitting and/or standing);Decreased cognition;Decreased safety awareness;Decreased knowledge of use of DME or AE;Pain   OT Treatment/Interventions: Self-care/ADL training;Therapeutic exercise;Energy conservation;Therapeutic activities;Patient/family education;Balance training      OT Goals(Current goals can be found in the care plan section)   Acute Rehab OT Goals Patient Stated Goal: none stated this session OT Goal Formulation: With patient/family Time For Goal Achievement: 01/15/24 Potential to Achieve Goals: Fair ADL Goals Pt Will Perform Lower Body Bathing: with min assist;with adaptive equipment Pt Will Perform Lower Body Dressing: with min assist;with adaptive equipment Pt Will Transfer to Toilet: with contact guard assist;stand pivot transfer;bedside commode   OT Frequency:  Min 1X/week    Co-evaluation PT/OT/SLP Co-Evaluation/Treatment: Yes  Reason for Co-Treatment: Necessary to address cognition/behavior during functional activity;To address functional/ADL transfers   OT goals addressed during session: Proper use of Adaptive equipment and DME;Strengthening/ROM      AM-PAC OT 6 Clicks Daily Activity     Outcome Measure Help from another person eating  meals?: A Little Help from another person taking care of personal grooming?: A Little Help from another person toileting, which includes using toliet, bedpan, or urinal?: Total Help from another person bathing (including washing, rinsing, drying)?: A Lot Help from another person to put on and taking off regular upper body clothing?: A Little Help from another person to put on and taking off regular lower body clothing?: A Lot 6 Click Score: 14   End of Session Equipment Utilized During Treatment: Gait belt;Rolling walker (2 wheels) Nurse Communication: Patient requests pain meds;Mobility status  Activity Tolerance: Patient limited by pain Patient left: in bed;with call bell/phone within reach;with chair alarm set;with family/visitor present  OT Visit Diagnosis: Unsteadiness on feet (R26.81);Other abnormalities of gait and mobility (R26.89);Muscle weakness (generalized) (M62.81);History of falling (Z91.81);Other symptoms and signs involving cognitive function;Pain Pain - Right/Left: Right Pain - part of body: Leg                Time: 1201-1230 OT Time Calculation (min): 29 min Charges:  OT General Charges $OT Visit: 1 Visit OT Evaluation $OT Eval Moderate Complexity: 1 Mod  Maurilio CROME, OTR/L.  [facility]  Office: [phone]   Maurilio PARAS Zohan Shiflet 01/01/2024, 1:03 PM

## 2024-01-01 NOTE — Progress Notes (Signed)
 Not able to reach POA for blood transfusion consent at this time. MD notified

## 2024-01-01 NOTE — Progress Notes (Signed)
 Physical Therapy Treatment  Patient Details Name: [name] MRN: [id number] DOB: 09/16/1940 Today's Date: 01/01/2024   History of Present Illness Patient is an 83 year old male,  s/p fall with intertrochanteric right hip fracture.  Underwent intramedullary fixation 12/30/23.  PMH significant for dementia, CRF, CAD, DDD, GERD.    PT Comments  Pt progressing towards physical therapy goals. Overall motivated to participate however in increased pain during time of therapy session. RN notified pt requesting pain meds. Focus of session was transfer OOB to chair. Daughter present and supportive throughout. Recommend <3 hours/day continued skilled PT services at d/c to maximize functional independence, safety, and encourage return to PLOF. Will continue to follow.     If plan is discharge home, recommend the following: Two people to help with bathing/dressing/bathroom;Two people to help with walking and/or transfers;Help with stairs or ramp for entrance;Assistance with cooking/housework;Assist for transportation;Direct supervision/assist for financial management;Direct supervision/assist for medications management   Can travel by private vehicle     No  Equipment Recommendations  BSC/3in1 (has RW)    Recommendations for Other Services       Precautions / Restrictions Precautions Precautions: Fall Recall of Precautions/Restrictions: Impaired Restrictions Weight Bearing Restrictions Per Provider Order: Yes RLE Weight Bearing Per Provider Order: Weight bearing as tolerated     Mobility  Bed Mobility Overal bed mobility: Needs Assistance Bed Mobility: Supine to Sit     Supine to sit: Max assist, +2 for physical assistance     General bed mobility comments: Pt required Max A +2 for bed mobility to manage BLE to elevate trunk from bed with use of bed pads. Pt able to maintain sitting balance EOB with close supervision once seated upright.    Transfers Overall transfer level:  Needs assistance Equipment used: Rolling walker (2 wheels) Transfers: Sit to/from Stand, Bed to chair/wheelchair/BSC Sit to Stand: Mod assist, +2 physical assistance, From elevated surface   Step pivot transfers: Mod assist, +2 physical assistance       General transfer comment: Pt with three sit to stand attempts this date. Pt successful on first attempt with multimodal cues for proper hand placement and Mod A +2, but initiated return to sitting d/t pain. Pt unable to clear bed on second attempt and so bed height was elevated. Pt able to come to standing on third attempt with Mod A +2. Pt able to step pivot to recliner on L side with dense verbal cues for sequencing steps and management of RW. Pt only able to manage a few pivotal steps before chair needed to be pulled up behind him to sit.    Ambulation/Gait               General Gait Details: Unable to progress to gait training this session.   Stairs             Wheelchair Mobility     Tilt Bed    Modified Rankin (Stroke Patients Only)       Balance Overall balance assessment: Needs assistance Sitting-balance support: Bilateral upper extremity supported, Feet supported Sitting balance-Leahy Scale: Fair Sitting balance - Comments: Pt required close supervision to maintain sitting balance EOB.   Standing balance support: Bilateral upper extremity supported, During functional activity, Reliant on assistive device for balance Standing balance-Leahy Scale: Poor Standing balance comment: dependent on RW and external support  Communication Communication Communication: Impaired Factors Affecting Communication: Reduced clarity of speech  Cognition Arousal: Alert Behavior During Therapy: WFL for tasks assessed/performed   PT - Cognitive impairments: History of cognitive impairments                         Following commands: Impaired Following commands impaired:  Follows one step commands with increased time, Follows one step commands inconsistently    Cueing Cueing Techniques: Verbal cues, Visual cues, Gestural cues  Exercises General Exercises - Lower Extremity Ankle Circles/Pumps: 10 reps, Both Long Arc Quad: 5 reps, Right    General Comments General comments (skin integrity, edema, etc.): Session completed on RA. SpO2 initially 81% following supine to sit. Pt guided through pursed lip breathing and with time was able to return to 93%.  placed back onto Pt at end of session with Pt seated upright in recliner. SpO2 returned to 95%.      Pertinent Vitals/Pain Pain Assessment Pain Assessment: Faces Faces Pain Scale: Hurts whole lot Pain Location: RLE Pain Descriptors / Indicators: Discomfort, Grimacing, Moaning Pain Intervention(s): Limited activity within patient's tolerance, Monitored during session, Repositioned    Home Living Family/patient expects to be discharged to:: Skilled nursing facility                   Additional Comments: Per chart review, [name] was present and provided info regarding pt's prior level of functioning and living situation, as the pt had some difficulty with memory/recall.    Prior Function            PT Goals (current goals can now be found in the care plan section) Acute Rehab PT Goals Patient Stated Goal: Daughter:  get him into a rehab facility PT Goal Formulation: With patient/family Time For Goal Achievement: 01/14/24 Potential to Achieve Goals: Good Progress towards PT goals: Progressing toward goals    Frequency    Min 3X/week      PT Plan      Co-evaluation PT/OT/SLP Co-Evaluation/Treatment: Yes Reason for Co-Treatment: Necessary to address cognition/behavior during functional activity;To address functional/ADL transfers;Complexity of the patient's impairments (multi-system involvement);For patient/therapist safety PT goals addressed during session: Mobility/safety with  mobility;Balance;Proper use of DME;Strengthening/ROM OT goals addressed during session: Proper use of Adaptive equipment and DME;Strengthening/ROM      AM-PAC PT 6 Clicks Mobility   Outcome Measure  Help needed turning from your back to your side while in a flat bed without using bedrails?: A Lot Help needed moving from lying on your back to sitting on the side of a flat bed without using bedrails?: Total Help needed moving to and from a bed to a chair (including a wheelchair)?: Total Help needed standing up from a chair using your arms (e.g., wheelchair or bedside chair)?: Total Help needed to walk in hospital room?: Total Help needed climbing 3-5 steps with a railing? : Total 6 Click Score: 7    End of Session Equipment Utilized During Treatment: Gait belt Activity Tolerance: Patient limited by pain Patient left: in bed;with call bell/phone within reach;with bed alarm set;with family/visitor present Nurse Communication: Mobility status PT Visit Diagnosis: Unsteadiness on feet (R26.81);Other abnormalities of gait and mobility (R26.89);Repeated falls (R29.6);Muscle weakness (generalized) (M62.81);History of falling (Z91.81)     Time: 1201-1230 PT Time Calculation (min) (ACUTE ONLY): 29 min  Charges:    $Gait Training: 8-22 mins PT General Charges $$ ACUTE PT VISIT: 1 Visit  Leita Sable, PT, DPT Acute Rehabilitation Services Secure Chat Preferred Office: [phone]    Leita JONETTA Sable 01/01/2024, 1:22 PM

## 2024-01-02 ENCOUNTER — Inpatient Hospital Stay (HOSPITAL_COMMUNITY)

## 2024-01-02 ENCOUNTER — Encounter (HOSPITAL_COMMUNITY): Payer: Self-pay | Admitting: Orthopedic Surgery

## 2024-01-02 DIAGNOSIS — D62 Acute posthemorrhagic anemia: Secondary | ICD-10-CM | POA: Diagnosis not present

## 2024-01-02 DIAGNOSIS — J9601 Acute respiratory failure with hypoxia: Secondary | ICD-10-CM | POA: Diagnosis not present

## 2024-01-02 DIAGNOSIS — U071 COVID-19: Secondary | ICD-10-CM | POA: Diagnosis not present

## 2024-01-02 DIAGNOSIS — S72001A Fracture of unspecified part of neck of right femur, initial encounter for closed fracture: Secondary | ICD-10-CM | POA: Diagnosis not present

## 2024-01-02 LAB — COMPREHENSIVE METABOLIC PANEL WITH GFR
ALT: 13 U/L (ref 0–44)
AST: 22 U/L (ref 15–41)
Albumin: 2.5 g/dL — ABNORMAL LOW (ref 3.5–5.0)
Alkaline Phosphatase: 39 U/L (ref 38–126)
Anion gap: 13 (ref 5–15)
BUN: 63 mg/dL — ABNORMAL HIGH (ref 8–23)
CO2: 21 mmol/L — ABNORMAL LOW (ref 22–32)
Calcium: 7.8 mg/dL — ABNORMAL LOW (ref 8.9–10.3)
Chloride: 104 mmol/L (ref 98–111)
Creatinine, Ser: 1.81 mg/dL — ABNORMAL HIGH (ref 0.61–1.24)
GFR, Estimated: 36 mL/min — ABNORMAL LOW (ref >60)
Glucose, Bld: 136 mg/dL — ABNORMAL HIGH (ref 70–99)
Potassium: 4.9 mmol/L (ref 3.5–5.1)
Sodium: 138 mmol/L (ref 135–145)
Total Bilirubin: 0.8 mg/dL (ref 0.0–1.2)
Total Protein: 5.3 g/dL — ABNORMAL LOW (ref 6.5–8.1)

## 2024-01-02 LAB — CBC WITH DIFFERENTIAL/PLATELET
Abs Immature Granulocytes: 0.1 K/uL — ABNORMAL HIGH (ref 0.00–0.07)
Basophils Absolute: 0 K/uL (ref 0.0–0.1)
Basophils Relative: 0 %
Eosinophils Absolute: 0 K/uL (ref 0.0–0.5)
Eosinophils Relative: 0 %
HCT: 24.1 % — ABNORMAL LOW (ref 39.0–52.0)
Hemoglobin: 7.9 g/dL — ABNORMAL LOW (ref 13.0–17.0)
Immature Granulocytes: 1 %
Lymphocytes Relative: 6 %
Lymphs Abs: 0.6 K/uL — ABNORMAL LOW (ref 0.7–4.0)
MCH: 31.3 pg (ref 26.0–34.0)
MCHC: 32.8 g/dL (ref 30.0–36.0)
MCV: 95.6 fL (ref 80.0–100.0)
Monocytes Absolute: 0.6 K/uL (ref 0.1–1.0)
Monocytes Relative: 6 %
Neutro Abs: 8.6 K/uL — ABNORMAL HIGH (ref 1.7–7.7)
Neutrophils Relative %: 87 %
Platelets: 100 K/uL — ABNORMAL LOW (ref 150–400)
RBC: 2.52 MIL/uL — ABNORMAL LOW (ref 4.22–5.81)
RDW: 17.2 % — ABNORMAL HIGH (ref 11.5–15.5)
WBC: 9.9 K/uL (ref 4.0–10.5)
nRBC: 0.2 % (ref 0.0–0.2)

## 2024-01-02 LAB — FERRITIN: Ferritin: 68 ng/mL (ref 24–336)

## 2024-01-02 LAB — FIBRINOGEN: Fibrinogen: 181 mg/dL — ABNORMAL LOW (ref 210–475)

## 2024-01-02 LAB — SEDIMENTATION RATE: Sed Rate: 9 mm/h (ref 0–16)

## 2024-01-02 LAB — LACTATE DEHYDROGENASE: LDH: 177 U/L (ref 105–235)

## 2024-01-02 LAB — TYPE AND SCREEN
ABO/RH(D): A POS
Antibody Screen: NEGATIVE
Unit division: 0

## 2024-01-02 LAB — C-REACTIVE PROTEIN: CRP: 1.1 mg/dL — ABNORMAL HIGH (ref <1.0)

## 2024-01-02 LAB — BPAM RBC
Blood Product Expiration Date: 202512052359
ISSUE DATE / TIME: 202511170642
Unit Type and Rh: 6200

## 2024-01-02 LAB — MAGNESIUM: Magnesium: 2.3 mg/dL (ref 1.7–2.4)

## 2024-01-02 LAB — D-DIMER, QUANTITATIVE: D-Dimer, Quant: 4.08 ug{FEU}/mL — ABNORMAL HIGH (ref 0.00–0.50)

## 2024-01-02 LAB — PHOSPHORUS: Phosphorus: 4.6 mg/dL (ref 2.5–4.6)

## 2024-01-02 NOTE — Progress Notes (Signed)
 11/18- Patient under [place] precautions, letter given to nurse to hand to patient.

## 2024-01-02 NOTE — Progress Notes (Signed)
 Heart Failure Navigator Progress Note  Assessed for Heart & Vascular TOC clinic readiness.  Patient does not meet criteria due to per MD note, patient with history of dementia. No HF TOC. .   Navigator will sign off at this time.   Stephane Haddock, BSN, Scientist, Clinical (histocompatibility And Immunogenetics) Only

## 2024-01-02 NOTE — Care Management Important Message (Signed)
 Important Message  Patient Details  Name: [name] MRN: [id number] Date of Birth: 18-Nov-1940   Important Message Given:  Yes - Medicare IM     [name] 01/02/2024, 1:14 PM

## 2024-01-02 NOTE — Progress Notes (Signed)
 PROGRESS NOTE    NASHAWN HILLOCK  FMW:[id number] DOB: September 25, 1940 DOA: 12/30/2023 PCP: Charlott Dorn LABOR, MD   Brief Narrative:  [name] is a 83 y.o. male with medical history significant of CAD, hypothyroidism, hypertension, dyslipidemia, dementia with protein calorie malnutrition and failure to thrive, GERD, previous stage II sacral decubitus, CKD 3 who was sent to the ER after an unwitnessed fall in the bathroom resulting in right-sided hip deformity and pain with shortening and rotation.  Also was Positive for COVID and was symptomatic on 11/14. Orthopedic Surgery consulted and took the patient to the OR for Closed Reduction and Intramedullary Fixation of the Right Intertrochanteric Fx on 11/15. He is steadily improving. Had to be transfused pRBCs but appears stable.    Assessment and Plan:  Suspected Mechanical Unwitnessed Fall with Acute comminuted fracture of Right Femur: Orthopedic Surgery Consulted and took patient for Operative Intervention on 11/15. VTE Prophylaxis per Orthopedic Surgery and now on ASA 81 mg po BID. PT/OT to evaluate and recommending SNF. Pain Control and Bowel Regimen. IVF as below now stopped. Appears to have improved and is approaching medical stability to D/C to SNF   Possible Syncopal Episode: Continue Telemetry Monitoring;  PT/OT are recommending SNF; Check Orthostatic VS in the AM. IVF w/ NS @ 75 mL/hr now stopped. Checked a Transthoracic Echocardiogram showed an EF of 40 to 45% with no regional wall motion abnormalities and grade 1 diastolic dysfunction with right ventricular systolic function being normal.   COVID-19 Infection: Patient was actually diagnosed days prior to admission. Fevers have defervesced. He was on Paxlovid prior to admission which is not continued. -Supportive care and C/w Dexamethasone  6 mg Daily. Inflammatory Marker Trend  Recent Labs    01/01/24 0414 01/02/24 0509  DDIMER 7.76* 4.08*  FERRITIN 78 68  LDH 161 177  CRP  2.8* 1.1*   Lab Results  Component Value Date   SARSCOV2NAA POSITIVE (A) 12/30/2023   SARSCOV2NAA NEGATIVE 09/14/2021   SARSCOV2NAA NEGATIVE 07/08/2020   SARSCOV2NAA NEGATIVE 07/06/2020  -Add Guaifenesin  1200 mg po BID will continue Albuterol  2.5 mg neb every 4 as needed for wheezing or shortness of breath - SNF and per TOC will need a 10-day quarantine prior to being accepted  Acute Respiratory Failure w/ Hypoxia in the setting of Above: Patient continues to be a little bit hypoxic and on ambulation did drop.  Remains on 2 L of supplemental oxygen and will continue flutter valve and incentive spirometer.  Repeat chest x-ray in AM.  Chest x-ray today showed No acute cardiopulmonary process, Aortic atherosclerosis, and  Emphysema.   AKI on CKD Stage 3a / Elevated AG Metabolic Acidosis: B/L Cr is ~1.3. BUN/Cr Trend: Recent Labs  Lab 12/30/23 0053 12/31/23 0714 01/01/24 0414 01/02/24 0509  BUN 35* 55* 59* 63*  CREATININE 1.52* 2.19* 1.92* 1.81*  - IV fluid hydration is now stopped.metabolic acidosis is improved and he has a CO2 22, anion gap of 10, chloride level 105. Avoid Nephrotoxic Medications, Contrast Dyes, Hypotension and Dehydration to Ensure Adequate Renal Perfusion and will need to Renally Adjust Meds. CTM and Trend Renal Function carefully and repeat CMP in the AM   CAD/Essential Hypertension/Dyslipidemia: Continue Pravastatin  40 mg po at bedtime. Diuretics on hold  Hyperkalemia: K+ is now gone from 5.2 -> 4.9. S/p 2 doses of 10 grams of Lokelma x1. C/w IVF. CTM and Trend and repeat CMP in the AM  Leukocytosis: In the setting of Surgery and steroid Demargination. WBC went from  6.4 -> 11.5 -> 10.6 -> 9.9. Check Inflammatory Markers for COVID. CTM for S/Sx of Infection. CXR showed no acute cardiopulmonary disease. Repeat CBC in the AM  ABLA as a postoperative Cause superimposed on Macrocytic Anemia: Hgb/Hct went from 11.5/35.8 -> 7.0/21.0 -> 6.2/18.6  w/ MCV of 101.6 and is now  gone to 8.6/25.8 -> 7.9/24.1 w/ MCV of 95.6. Checked Anemia Panel and showed an iron  level of 18, UIBC of 192, TIBC of 210, saturation ratios of 9%, ferritin of 78, folate level greater than 20 and a vitamin B12 level of 566.  With drop in hemoglobin we will Type and Screen and transfuse 1 unit PRBCs CTM For S/Sx of Bleeding; No overt bleeding noted. Repeat CBC in the AM  Thrombocytopenia: Plt Count went from 153 -> 110 -> 98 -> 100. CTM for S/Sx of Bleeding; No overt bleeding noted. Repeat CBC in the AM   Dementia: Pleasant @ baseline. C/w Divalproex  250 mg po BID, Memantine  10 mg po BID, and Sertraline 25 mg po Daily; Delirium Precautions  GERD/GI Prophylaxis: C/w PPI with Pantoprazole  40 mg po daily   Hypoalbuminemia: Patient's Albumin  Lvl was 2.6 -> 2.2 -> 2.5. CTM and Trend and repeat CMP in the AM  Protein Calorie Malnutrition/Failure to Thrive: Nutrition Consultation   Hx stage 2 Decubitus Ulcer:  Wound 12/30/23 0948 Pressure Injury Sacrum Medial;Upper Stage 1 -  Intact skin with non-blanchable redness of a localized area usually over a bony prominence. (Active)    DVT prophylaxis: SCDs Start: 12/30/23 0845    Code Status: Limited: Do not attempt resuscitation (DNR) -DNR-LIMITED -Do Not Intubate/DNI  Family Communication: D/w Daughter @ bedside   Disposition Plan:  Level of care: Telemetry Status is: Inpatient Remains inpatient appropriate because: Needs further clinical improvement and clearance by specialists  Consultants:  Orthopedic Surgery  Procedures:  As delineated as above; Echocardiogram  Antimicrobials:  Anti-infectives (From admission, onward)    None       Subjective: Seen and examined at bedside and was doing okay.  Still having some pain.  No nausea or vomiting.  Denies any lightheadedness or dizziness passing flatus.  Still has not had a bowel movement yet.  No other concerns or complaints at this time.  Still wearing oxygen but oxygen is weaning and white  count has resolved.  Denies any other concerns or complaint at this time.  Objective: Vitals:   01/01/24 2002 01/02/24 0416 01/02/24 0715 01/02/24 1508  BP: (!) 109/53 (!) 109/39 (!) 142/58 (!) 127/51  Pulse: (!) 58 (!) 56 60 61  Resp: 16  16 16   Temp: 97.8 F (36.6 C) 97.6 F (36.4 C) 97.8 F (36.6 C) 99 F (37.2 C)  TempSrc: Oral Oral    SpO2: 99% 99% 100% 100%  Weight:      Height:        Intake/Output Summary (Last 24 hours) at 01/02/2024 1641 Last data filed at 01/02/2024 1300 Gross per 24 hour  Intake 240 ml  Output 650 ml  Net -410 ml   Filed Weights   12/30/23 1116  Weight: 73.9 kg   Examination: Physical Exam:  Constitutional: Chronically ill-appearing elderly Caucasian male no acute distress Respiratory: Diminished to auscultation bilaterally with some coarse breath sounds-slight rhonchi but no appreciable wheezing, rales or crackles. Normal respiratory effort and patient is not tachypenic. No accessory muscle use.  Wearing supplemental oxygen nasal cannula Cardiovascular: RRR, no murmurs / rubs / gallops. S1 and S2 auscultated. No extremity edema.  Abdomen: Soft, non-tender, non-distended. Bowel sounds positive.  GU: Deferred. Musculoskeletal: No clubbing / cyanosis of digits/nails. No joint deformity upper and lower extremities.  Skin: No rashes, lesions, ulcers on a limited skin evaluation. No induration; Warm and dry.  Neurologic: CN 2-12 grossly intact with no focal deficits. Romberg sign and cerebellar reflexes not assessed.  Psychiatric: Normal judgment and insight. Alert and oriented x 3. Normal mood and appropriate affect.   Data Reviewed: I have personally reviewed following labs and imaging studies  CBC: Recent Labs  Lab 12/30/23 0053 12/31/23 0714 01/01/24 0414 01/01/24 2017 01/02/24 0509  WBC 6.4 11.5* 10.6*  --  9.9  NEUTROABS 4.3  --  9.2*  --  8.6*  HGB 11.5* 7.0* 6.2* 8.6* 7.9*  HCT 35.8* 21.0* 18.6* 25.8* 24.1*  MCV 103.2* 102.4*  101.6*  --  95.6  PLT 153 110* 98*  --  100*   Basic Metabolic Panel: Recent Labs  Lab 12/30/23 0053 12/30/23 0750 12/31/23 0714 01/01/24 0414 01/02/24 0509  NA 136  --  137 137 138  K 4.4  --  5.3* 5.2* 4.9  CL 100  --  100 105 104  CO2 22  --  21* 22 21*  GLUCOSE 117*  --  155* 147* 136*  BUN 35*  --  55* 59* 63*  CREATININE 1.52*  --  2.19* 1.92* 1.81*  CALCIUM  8.1*  --  7.6* 7.3* 7.8*  MG  --  2.0  --  2.2 2.3  PHOS  --   --   --  5.6* 4.6   GFR: Estimated Creatinine Clearance: 31.8 mL/min (A) (by C-G formula based on SCr of 1.81 mg/dL (H)). Liver Function Tests: Recent Labs  Lab 12/30/23 0053 01/01/24 0414 01/02/24 0509  AST 23 23 22   ALT 17 12 13   ALKPHOS 74 39 39  BILITOT 0.4 0.7 0.8  PROT 5.8* 4.7* 5.3*  ALBUMIN  2.6* 2.2* 2.5*   No results for input(s): LIPASE, AMYLASE in the last 168 hours. No results for input(s): AMMONIA in the last 168 hours. Coagulation Profile: Recent Labs  Lab 12/30/23 0750  INR 1.1   Cardiac Enzymes: No results for input(s): CKTOTAL, CKMB, CKMBINDEX, TROPONINI in the last 168 hours. BNP (last 3 results) No results for input(s): PROBNP in the last 8760 hours. HbA1C: No results for input(s): HGBA1C in the last 72 hours. CBG: No results for input(s): GLUCAP in the last 168 hours. Lipid Profile: No results for input(s): CHOL, HDL, LDLCALC, TRIG, CHOLHDL, LDLDIRECT in the last 72 hours. Thyroid  Function Tests: No results for input(s): TSH, T4TOTAL, FREET4, T3FREE, THYROIDAB in the last 72 hours. Anemia Panel: Recent Labs    01/01/24 0414 01/02/24 0509  VITAMINB12 566  --   FOLATE >20.0  --   FERRITIN 78 68  TIBC 210*  --   IRON  18*  --   RETICCTPCT 2.9  --    Sepsis Labs: No results for input(s): PROCALCITON, LATICACIDVEN in the last 168 hours.  Recent Results (from the past 240 hours)  Resp panel by RT-PCR (RSV, Flu A&B, Covid) Anterior Nasal Swab     Status: Abnormal    Collection Time: 12/30/23  2:02 AM   Specimen: Anterior Nasal Swab  Result Value Ref Range Status   SARS Coronavirus 2 by RT PCR POSITIVE (A) NEGATIVE Final   Influenza A by PCR NEGATIVE NEGATIVE Final   Influenza B by PCR NEGATIVE NEGATIVE Final    Comment: (NOTE) The Xpert Xpress SARS-CoV-2/FLU/RSV plus  assay is intended as an aid in the diagnosis of influenza from Nasopharyngeal swab specimens and should not be used as a sole basis for treatment. Nasal washings and aspirates are unacceptable for Xpert Xpress SARS-CoV-2/FLU/RSV testing.  Fact Sheet for Patients: bloggercourse.com  Fact Sheet for Healthcare Providers: seriousbroker.it  This test is not yet approved or cleared by the [place]  FDA and has been authorized for detection and/or diagnosis of SARS-CoV-2 by FDA under an Emergency Use Authorization (EUA). This EUA will remain in effect (meaning this test can be used) for the duration of the COVID-19 declaration under Section 564(b)(1) of the Act, 21 U.S.C. section 360bbb-3(b)(1), unless the authorization is terminated or revoked.     Resp Syncytial Virus by PCR NEGATIVE NEGATIVE Final    Comment: (NOTE) Fact Sheet for Patients: bloggercourse.com  Fact Sheet for Healthcare Providers: seriousbroker.it  This test is not yet approved or cleared by the [place]  FDA and has been authorized for detection and/or diagnosis of SARS-CoV-2 by FDA under an Emergency Use Authorization (EUA). This EUA will remain in effect (meaning this test can be used) for the duration of the COVID-19 declaration under Section 564(b)(1) of the Act, 21 U.S.C. section 360bbb-3(b)(1), unless the authorization is terminated or revoked.  Performed at [facility], [address], [place], [place] [address]   Surgical pcr screen     Status: None   Collection Time: 12/30/23 11:02 AM    Specimen: Nasal Mucosa; Nasal Swab  Result Value Ref Range Status   MRSA, PCR NEGATIVE NEGATIVE Final   Staphylococcus aureus NEGATIVE NEGATIVE Final    Comment: (NOTE) The Xpert SA Assay (FDA approved for NASAL specimens in patients 56 years of age and older), is one component of a comprehensive surveillance program. It is not intended to diagnose infection nor to guide or monitor treatment. Performed at [facility], [address], [place], [place] [address]     Radiology Studies: DG CHEST PORT 1 VIEW Result Date: 01/02/2024 EXAM: 1 VIEW(S) XRAY OF THE CHEST 01/02/2024 06:14:31 AM COMPARISON: 01/01/2024 CLINICAL HISTORY: COVID FINDINGS: LUNGS AND PLEURA: Emphysema. No pleural effusion. No pneumothorax. HEART AND MEDIASTINUM: Aortic atherosclerosis. No acute abnormality of the cardiac silhouette. BONES AND SOFT TISSUES: No acute osseous abnormality. IMPRESSION: 1. No acute cardiopulmonary process. 2. Aortic atherosclerosis. 3. Emphysema. Electronically signed by: Ryan Salvage MD 01/02/2024 11:51 AM EST RP Workstation: [id number]   ECHOCARDIOGRAM COMPLETE Result Date: 01/01/2024    ECHOCARDIOGRAM REPORT   Patient Name:   CORRIE BRANNEN Date of Exam: 01/01/2024 Medical Rec #:  [id number]       Height:       72.0 in Accession #:    [id number]      Weight:       163.0 lb Date of Birth:  Dec 30, 1940      BSA:          1.953 m Patient Age:    83 years        BP:           121/61 mmHg Patient Gender: M               HR:           63 bpm. Exam Location:  Inpatient Procedure: 2D Echo, Cardiac Doppler and Color Doppler (Both Spectral and Color            Flow Doppler were utilized during procedure). Indications:    Syncope R55  History:  Patient has prior history of Echocardiogram examinations, most                 recent 09/19/2022. CAD; Risk Factors:Hypertension.  Sonographer:    Jayson Gaskins Referring Phys: [phone] Samaj A NETTEY IMPRESSIONS  1. Left ventricular ejection fraction, by  estimation, is 45 to 50%. The left ventricle has mildly decreased function. The left ventricle has no regional wall motion abnormalities. Left ventricular diastolic parameters are consistent with Grade I diastolic dysfunction (impaired relaxation).  2. Right ventricular systolic function is normal. The right ventricular size is normal.  3. The mitral valve is normal in structure. No evidence of mitral valve regurgitation. No evidence of mitral stenosis.  4. The aortic valve is normal in structure. Aortic valve regurgitation is not visualized. No aortic stenosis is present.  5. The inferior vena cava is normal in size with greater than 50% respiratory variability, suggesting right atrial pressure of 3 mmHg. Comparison(s): No significant change from prior study. FINDINGS  Left Ventricle: Left ventricular ejection fraction, by estimation, is 45 to 50%. The left ventricle has mildly decreased function. The left ventricle has no regional wall motion abnormalities. The left ventricular internal cavity size was normal in size. There is no left ventricular hypertrophy. Left ventricular diastolic parameters are consistent with Grade I diastolic dysfunction (impaired relaxation). Right Ventricle: The right ventricular size is normal. No increase in right ventricular wall thickness. Right ventricular systolic function is normal. Left Atrium: Left atrial size was normal in size. Right Atrium: Right atrial size was normal in size. Pericardium: There is no evidence of pericardial effusion. Mitral Valve: The mitral valve is normal in structure. No evidence of mitral valve regurgitation. No evidence of mitral valve stenosis. Tricuspid Valve: The tricuspid valve is normal in structure. Tricuspid valve regurgitation is not demonstrated. No evidence of tricuspid stenosis. Aortic Valve: The aortic valve is normal in structure. Aortic valve regurgitation is not visualized. No aortic stenosis is present. Aortic valve mean gradient  measures 2.0 mmHg. Aortic valve peak gradient measures 2.8 mmHg. Aortic valve area, by VTI measures 3.52 cm. Pulmonic Valve: The pulmonic valve was normal in structure. Pulmonic valve regurgitation is not visualized. No evidence of pulmonic stenosis. Aorta: The aortic root is normal in size and structure. Venous: The inferior vena cava is normal in size with greater than 50% respiratory variability, suggesting right atrial pressure of 3 mmHg. IAS/Shunts: No atrial level shunt detected by color flow Doppler.  LEFT VENTRICLE PLAX 2D LVIDd:         4.20 cm   Diastology LVIDs:         3.20 cm   LV e' medial:    6.42 cm/s LV PW:         0.80 cm   LV E/e' medial:  11.4 LV IVS:        0.90 cm   LV e' lateral:   3.81 cm/s LVOT diam:     2.00 cm   LV E/e' lateral: 19.3 LV SV:         62 LV SV Index:   32 LVOT Area:     3.14 cm  LEFT ATRIUM           Index LA Vol (A4C): 17.0 ml 8.70 ml/m  AORTIC VALVE AV Area (Vmax):    3.20 cm AV Area (Vmean):   3.31 cm AV Area (VTI):     3.52 cm AV Vmax:           83.10  cm/s AV Vmean:          58.100 cm/s AV VTI:            0.175 m AV Peak Grad:      2.8 mmHg AV Mean Grad:      2.0 mmHg LVOT Vmax:         84.60 cm/s LVOT Vmean:        61.300 cm/s LVOT VTI:          0.196 m LVOT/AV VTI ratio: 1.12  AORTA Ao Root diam: 3.00 cm MITRAL VALVE MV Area (PHT): 3.19 cm    SHUNTS MV Decel Time: 238 msec    Systemic VTI:  0.20 m MV E velocity: 73.40 cm/s  Systemic Diam: 2.00 cm MV A velocity: 99.00 cm/s MV E/A ratio:  0.74 Morene Brownie Electronically signed by Morene Brownie Signature Date/Time: 01/01/2024/1:22:51 PM    Final    DG CHEST PORT 1 VIEW Result Date: 01/01/2024 EXAM: 1 VIEW(S) XRAY OF THE CHEST 01/01/2024 05:59:34 AM COMPARISON: None available. CLINICAL HISTORY: [id number] COVID [id number] COVID FINDINGS: LUNGS AND PLEURA: Lungs appear hyperinflated with chronic interstitial changes compatible with emphysema. Diffuse bronchial wall thickening. No pleural effusion. No pneumothorax.  HEART AND MEDIASTINUM: Heart size normal. Aortic atherosclerosis. BONES AND SOFT TISSUES: No acute osseous abnormality. IMPRESSION: 1. Hyperinflated lungs with chronic interstitial changes compatible with emphysema and diffuse bronchial wall thickening. Electronically signed by: Taylor Stroud MD 01/01/2024 06:15 AM EST RP Workstation: [id number]   Scheduled Meds:  sodium chloride    Intravenous Once   aspirin  EC  81 mg Oral BID   dexamethasone  (DECADRON ) injection  6 mg Intravenous Daily   divalproex   250 mg Oral BID   feeding supplement  237 mL Oral BID BM   guaiFENesin   1,200 mg Oral BID   loperamide   2 mg Oral Daily   memantine   10 mg Oral BID   methocarbamol   500-1,000 mg Oral QID   multivitamin with minerals  1 tablet Oral Q breakfast   pantoprazole   40 mg Oral QAC breakfast   pravastatin   40 mg Oral QHS   sertraline  25 mg Oral Daily   sodium chloride  flush  3 mL Intravenous Q12H   Continuous Infusions:   LOS: 3 days   Alejandro Marker, DO [facility] Available via Epic secure chat 7am-7pm After these hours, please refer to coverage provider listed on [email or web address] 01/02/2024, 4:41 PM

## 2024-01-02 NOTE — [place] FL2 (Signed)
 [place]  MEDICAID FL2 LEVEL OF CARE FORM     IDENTIFICATION  Patient Name: [name] Birthdate: 05-08-1940 Sex: male Admission Date (Current Location): 12/30/2023  [facility] and [place] Number:  Producer, Television/film/video and Address:  [facility], [address] [place], [place] [address]      Provider Number: [id number]  Attending Physician Name and Address:  Sherrill Alejandro Donovan, DO  Relative Name and Phone Number:  Althia Moats Daughter [phone]  [phone]    Current Level of Care: Hospital Recommended Level of Care: Skilled Nursing Facility Prior Approval Number:    Date Approved/Denied:   PASRR Number: [id number]  Discharge Plan: SNF    Current Diagnoses: Patient Active Problem List   Diagnosis Date Noted   Closed right hip fracture (HCC) 12/30/2023   Failure to thrive in adult 02/19/2023   Acute hip pain, left 02/15/2023   Right rib fracture 02/15/2023   Fall 02/15/2023   Infrarenal abdominal aortic aneurysm (AAA) without rupture 02/15/2023   Chronic diastolic CHF (congestive heart failure) (HCC) 02/15/2023   Heart failure with mildly reduced ejection fraction (HFmrEF) (HCC) 09/15/2021   CAP (community acquired pneumonia) 09/14/2021   Lower extremity edema 09/14/2021   CKD stage 3a, GFR 45-59 ml/min (HCC) 05/27/2021   Degeneration of lumbar intervertebral disc 05/27/2021   Dry eyes 05/27/2021   Loose stools 05/27/2021   Major depression in complete remission 05/27/2021   Major depression single episode, in partial remission 05/27/2021   Old myocardial infarction 05/27/2021   Pure hypercholesterolemia 05/27/2021   Pyloric channel ulcer 05/27/2021   Testicular hypofunction 05/27/2021   AKI (acute kidney injury) 07/08/2020   Thrombocytopenia 07/08/2020   Macrocytic anemia 07/08/2020   Dementia (HCC) 07/08/2020   Closed left hip fracture, initial encounter (HCC) 07/06/2020   Memory disorder 11/08/2018   Dysarthria  04/25/2017   Delayed gastric emptying 04/22/2016   Respiratory failure (HCC) 04/16/2016   SOB (shortness of breath)    NSVT (nonsustained ventricular tachycardia) (HCC)    Elevated troponin 04/15/2016   Gastric dysmotility    Gastrojejunostomy tube 02/06/2016 03/10/2016   Hyponatremia    Anxiety and depression    Sleep disturbance    Orthostasis    Leukocytosis    Hyperglycemia    FTT (failure to thrive) in adult    Anemia of chronic disease 01/31/2016   Stage 2 skin ulcer of sacral region (HCC) 01/31/2016   Hypomagnesemia 01/21/2016   Hypokalemia 01/21/2016   Pressure injury of skin 01/20/2016   Pleural effusion    GERD (gastroesophageal reflux disease) 01/19/2016   Insomnia 01/19/2016   Skin ulcer of abdominal wall, limited to breakdown of skin 01/17/2016   Dysphagia 01/17/2016   Debility 01/17/2016   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   Vitamin B12 deficiency 01/02/2016   BPH (benign prostatic hyperplasia)    Posterior vitreous detachment    Salzmann's nodular dystrophy of left eye    Hyperopia of both eyes with astigmatism and presbyopia    Central pterygium of right eye    Pressure ulcer of buttock, right, unstageable (HCC) 12/30/2015   Hypernatremia 12/28/2015   Tobacco abuse 12/09/2015   Gastritis 12/09/2015   Protein-calorie malnutrition, severe 12/04/2015   Encephalopathy acute 12/03/2015   Anxiety state 09/28/2015   HLD (hyperlipidemia)    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 09/25/2015 09/25/2015   CAD (coronary artery disease) 04/24/2013   Essential hypertension 04/24/2013   Other and unspecified hyperlipidemia 04/24/2013   Central  pterygium 10/30/2012   Far-sighted 10/30/2012   Dystrophy, Salzmann's nodular 10/30/2012   Cataract, nuclear sclerotic senile 10/30/2012    Orientation RESPIRATION BLADDER Height & Weight     Self, Place  O2 Continent, External catheter Weight: 163 lb (73.9 kg) Height:  6' (182.9 cm)   BEHAVIORAL SYMPTOMS/MOOD NEUROLOGICAL BOWEL NUTRITION STATUS        Diet (see discharge summary)  AMBULATORY STATUS COMMUNICATION OF NEEDS Skin   Total Care Verbally Surgical wounds, Other (Comment) (redness)                       Personal Care Assistance Level of Assistance  Bathing, Feeding, Dressing Bathing Assistance: Maximum assistance Feeding assistance: Limited assistance Dressing Assistance: Maximum assistance     Functional Limitations Info             SPECIAL CARE FACTORS FREQUENCY  PT (By licensed PT), OT (By licensed OT)     PT Frequency: 5x week OT Frequency: 5x week            Contractures Contractures Info: Not present    Additional Factors Info  Code Status, Allergies Code Status Info: DNR Allergies Info: Alfuzosin, Lisinopril, Lorazepam , Tamsulosin Hcl, Uroxatral (Alfuzosin Hcl Er)           Current Medications (01/02/2024):  This is the current hospital active medication list Current Facility-Administered Medications  Medication Dose Route Frequency Provider Last Rate Last Admin   0.9 %  sodium chloride  infusion (Manually program via Guardrails IV Fluids)   Intravenous Once Daniels, James K, NP       acetaminophen  (TYLENOL ) tablet 650 mg  650 mg Oral Q6H PRN McKenzie, Kayla J, PA-C   650 mg at 01/01/24 1304   Or   acetaminophen  (TYLENOL ) suppository 650 mg  650 mg Rectal Q6H PRN McKenzie, Kayla J, PA-C       albuterol  (PROVENTIL ) (2.5 MG/3ML) 0.083% nebulizer solution 2.5 mg  2.5 mg Nebulization Q4H PRN McKenzie, Kayla J, PA-C       aspirin  EC tablet 81 mg  81 mg Oral BID McKenzie, Kayla J, PA-C   81 mg at 01/02/24 1015   bismuth  subsalicylate (PEPTO BISMOL) 262 MG/15ML suspension 30 mL  30 mL Oral Q6H PRN McKenzie, Kayla J, PA-C       dexamethasone  (DECADRON ) injection 6 mg  6 mg Intravenous Daily McKenzie, Kayla J, PA-C   6 mg at 01/02/24 1016   divalproex  (DEPAKOTE ) DR tablet 250 mg  250 mg Oral BID Alto Isaiah CROME, NP   250 mg at  01/02/24 1016   feeding supplement (ENSURE PLUS HIGH PROTEIN) liquid 237 mL  237 mL Oral BID BM SheikhAlejandro Latif, DO   237 mL at 01/02/24 1016   fentaNYL  (SUBLIMAZE ) injection 25 mcg  25 mcg Intravenous Q2H PRN McKenzie, Kayla J, PA-C       guaiFENesin  (MUCINEX ) 12 hr tablet 1,200 mg  1,200 mg Oral BID Sheikh, Omair Latif, DO   1,200 mg at 01/02/24 1016   guaiFENesin  (ROBITUSSIN) 100 MG/5ML liquid 5 mL  5 mL Oral Q4H PRN Briana Elgin LABOR, MD   5 mL at 12/31/23 0232   HYDROcodone -acetaminophen  (NORCO/VICODIN) 5-325 MG per tablet 1-2 tablet  1-2 tablet Oral Q6H PRN McKenzie, Kayla J, PA-C   2 tablet at 01/01/24 2013   loperamide  (IMODIUM ) capsule 2 mg  2 mg Oral Daily McKenzie, Kayla J, PA-C   2 mg at 01/02/24 1016   melatonin tablet 3 mg  3 mg Oral QHS PRN McKenzie, Kayla J, PA-C       memantine  (NAMENDA ) tablet 10 mg  10 mg Oral BID Alto Isaiah CROME, NP   10 mg at 01/02/24 1016   methocarbamol  (ROBAXIN ) tablet 500-1,000 mg  500-1,000 mg Oral QID McKenzie, Kayla J, PA-C   500 mg at 01/02/24 1015   multivitamin with minerals tablet 1 tablet  1 tablet Oral Q breakfast McKenzie, Kayla J, PA-C   1 tablet at 01/02/24 1015   ondansetron  (ZOFRAN ) injection 4 mg  4 mg Intravenous Q6H PRN McKenzie, Kayla J, PA-C       pantoprazole  (PROTONIX ) EC tablet 40 mg  40 mg Oral QAC breakfast Alto Isaiah CROME, NP   40 mg at 01/02/24 1016   polyethylene glycol (MIRALAX  / GLYCOLAX ) packet 17 g  17 g Oral Daily PRN McKenzie, Kayla J, PA-C       pravastatin  (PRAVACHOL ) tablet 40 mg  40 mg Oral QHS Alto Isaiah CROME, NP   40 mg at 01/01/24 2001   sertraline (ZOLOFT) tablet 25 mg  25 mg Oral Daily Alto Isaiah CROME, NP   25 mg at 01/02/24 1016   sodium chloride  flush (NS) 0.9 % injection 3 mL  3 mL Intravenous Q12H McKenzie, Kayla J, PA-C   3 mL at 01/02/24 1017     Discharge Medications: Please see discharge summary for a list of discharge medications.  Relevant Imaging Results:  Relevant Lab  Results:   Additional Information SSN: [id number].  Covid positive, test date: 12/30/23.  Bridget Cordella Simmonds, LCSW

## 2024-01-02 NOTE — Progress Notes (Signed)
 Initial Nutrition Assessment  DOCUMENTATION CODES:   Not applicable  INTERVENTION:  Regular diet Automatic trays  Assistance with meals as needed Ensure Plus High Protein po BID, each supplement provides 350 kcal and 20 grams of protein. MVI w/minerals daily  Check Vitamin D  levels    NUTRITION DIAGNOSIS:   Increased nutrient needs related to post-op healing, wound healing as evidenced by  (hip fracture).   GOAL:   Patient will meet greater than or equal to 90% of their needs   MONITOR:   PO intake, Supplement acceptance, Labs  REASON FOR ASSESSMENT:   Malnutrition Screening Tool, Consult Assessment of nutrition requirement/status  ASSESSMENT:   medical history significant of CAD, hypothyroidism, hypertension, dyslipidemia, dementia with protein calorie malnutrition and failure to thrive, GERD, previous stage II sacral decubitus, CKD 3 who was sent to the ER after an unwitnessed fall in the bathroom resulting in right-sided hip deformity and pain with shortening and rotation.  Patient seen in room. Pt COVID positive on airborne precautions, lunch tray was left outside room. RD brought tray into room and helped set patient up to eat, observed patient start eating a few bites of food. Pt with h/o dementia and only oriented to self and unable to provide diet or weight history. Pt required frequent redirection as he kept asking where he was and unaware that he had a surgery to repair his hip after he fell.   Admit/current weight: 73.9 kg  Weight appears stable since the beginning of the year per chart history.  Nutritionally Relevant Medications: imodium , MVI w/minerals, protonix   Labs Reviewed: Glu 136, BUN 63, Cr 1.81, Calcium  7.8   NUTRITION - FOCUSED PHYSICAL EXAM:  Flowsheet Row Most Recent Value  Orbital Region No depletion  Upper Arm Region Mild depletion  Thoracic and Lumbar Region No depletion  Buccal Region No depletion  Temple Region Mild depletion   Clavicle Bone Region No depletion  Clavicle and Acromion Bone Region No depletion  Scapular Bone Region No depletion  Dorsal Hand Mild depletion  Patellar Region No depletion  Anterior Thigh Region No depletion  Posterior Calf Region No depletion  Hair Reviewed  Eyes Reviewed  Mouth Reviewed  Skin Reviewed  Nails Reviewed    Diet Order:   Diet Order             Diet regular Room service appropriate? Yes; Fluid consistency: Thin  Diet effective now                   EDUCATION NEEDS:   Not appropriate for education at this time  Skin:  Skin Assessment: Skin Integrity Issues: Skin Integrity Issues:: Stage I Stage I: medial sacrum  Last BM:  PTA  Height:   Ht Readings from Last 1 Encounters:  12/30/23 6' (1.829 m)    Weight:   Wt Readings from Last 1 Encounters:  12/30/23 73.9 kg     BMI:  Body mass index is 22.11 kg/m.  Estimated Nutritional Needs:   Kcal:  7939-7634  Protein:  89-111 g  Fluid:  2-2.3L/d  Madalyn Potters, MS, RD, LDN Clinical Dietitian  Contact via secure chat. If unavailable, use group chat RD Inpatient.

## 2024-01-02 NOTE — TOC Initial Note (Signed)
 Transition of Care [facility]) - Initial/Assessment Note    Patient Details  Name: [name] MRN: [id number] Date of Birth: 08/01/1940  Transition of [facility]) CM/SW Contact:    Bridget Cordella Simmonds, LCSW Phone Number: 01/02/2024, 2:04 PM  Clinical Narrative:    Pt oriented x1-2 and also covid positive as of 12/30/23.  CSW spoke with pt daughter [name] regarding PT recommendation for SNF.  Pt from home with [name].  No home services except HH aide in place when [name] is not available.  [name] agreeable to plan for SNF, preference would be [facility].  Permission given to send out referral in the hub.  Referral sent out in hub for SNF, CSW reached out to Brittany/[facility] to review.  She will review but reports they are still requiring 10 day quarantine.                 Expected Discharge Plan: Skilled Nursing Facility Barriers to Discharge: Continued Medical Work up, Other (must enter comment) (covid positive)   Patient Goals and CMS Choice     Choice offered to / list presented to : Adult Children (daughter [name] requetsing [employer])      Expected Discharge Plan and Services In-house Referral: Clinical Social Work   Post Acute Care Choice: Skilled Nursing Facility Living arrangements for the past 2 months: Single Family Home                                      Prior Living Arrangements/Services Living arrangements for the past 2 months: Single Family Home Lives with:: Adult Children (daughter [name])          Need for Family Participation in Patient Care: Yes (Comment) Care giver support system in place?: Yes (comment) Current home services: Homehealth aide (when [name] is not present) Criminal Activity/Legal Involvement Pertinent to Current Situation/Hospitalization: No - Comment as needed  Activities of Daily Living   ADL Screening (condition at time of admission) Independently performs ADLs?: No Does the patient have a NEW difficulty with  bathing/dressing/toileting/self-feeding that is expected to last >3 days?: No Does the patient have a NEW difficulty with getting in/out of bed, walking, or climbing stairs that is expected to last >3 days?: No Does the patient have a NEW difficulty with communication that is expected to last >3 days?: No Is the patient deaf or have difficulty hearing?: Yes Does the patient have difficulty seeing, even when wearing glasses/contacts?: No Does the patient have difficulty concentrating, remembering, or making decisions?: Yes  Permission Sought/Granted                  Emotional Assessment Appearance::  (no face to face contact: Covid +) Attitude/Demeanor/Rapport: Unable to Assess Affect (typically observed): Unable to Assess Orientation: : Oriented to Self      Admission diagnosis:  Closed right hip fracture (HCC) [S72.001A] Fall, initial encounter [W19.XXXA] Displaced intertrochanteric fracture of right femur, initial encounter for closed fracture (HCC) [S72.141A] COVID-19 [U07.1] Patient Active Problem List   Diagnosis Date Noted   Closed right hip fracture (HCC) 12/30/2023   Failure to thrive in adult 02/19/2023   Acute hip pain, left 02/15/2023   Right rib fracture 02/15/2023   Fall 02/15/2023   Infrarenal abdominal aortic aneurysm (AAA) without rupture 02/15/2023   Chronic diastolic CHF (congestive heart failure) (HCC) 02/15/2023   Heart failure with mildly reduced ejection fraction (HFmrEF) (HCC) 09/15/2021  CAP (community acquired pneumonia) 09/14/2021   Lower extremity edema 09/14/2021   CKD stage 3a, GFR 45-59 ml/min (HCC) 05/27/2021   Degeneration of lumbar intervertebral disc 05/27/2021   Dry eyes 05/27/2021   Loose stools 05/27/2021   Major depression in complete remission 05/27/2021   Major depression single episode, in partial remission 05/27/2021   Old myocardial infarction 05/27/2021   Pure hypercholesterolemia 05/27/2021   Pyloric channel ulcer 05/27/2021    Testicular hypofunction 05/27/2021   AKI (acute kidney injury) 07/08/2020   Thrombocytopenia 07/08/2020   Macrocytic anemia 07/08/2020   Dementia (HCC) 07/08/2020   Closed left hip fracture, initial encounter (HCC) 07/06/2020   Memory disorder 11/08/2018   Dysarthria 04/25/2017   Delayed gastric emptying 04/22/2016   Respiratory failure (HCC) 04/16/2016   SOB (shortness of breath)    NSVT (nonsustained ventricular tachycardia) (HCC)    Elevated troponin 04/15/2016   Gastric dysmotility    Gastrojejunostomy tube 02/06/2016 03/10/2016   Hyponatremia    Anxiety and depression    Sleep disturbance    Orthostasis    Leukocytosis    Hyperglycemia    FTT (failure to thrive) in adult    Anemia of chronic disease 01/31/2016   Stage 2 skin ulcer of sacral region (HCC) 01/31/2016   Hypomagnesemia 01/21/2016   Hypokalemia 01/21/2016   Pressure injury of skin 01/20/2016   Pleural effusion    GERD (gastroesophageal reflux disease) 01/19/2016   Insomnia 01/19/2016   Skin ulcer of abdominal wall, limited to breakdown of skin 01/17/2016   Dysphagia 01/17/2016   Debility 01/17/2016   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   Vitamin B12 deficiency 01/02/2016   BPH (benign prostatic hyperplasia)    Posterior vitreous detachment    Salzmann's nodular dystrophy of left eye    Hyperopia of both eyes with astigmatism and presbyopia    Central pterygium of right eye    Pressure ulcer of buttock, right, unstageable (HCC) 12/30/2015   Hypernatremia 12/28/2015   Tobacco abuse 12/09/2015   Gastritis 12/09/2015   Protein-calorie malnutrition, severe 12/04/2015   Encephalopathy acute 12/03/2015   Anxiety state 09/28/2015   HLD (hyperlipidemia)    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 [place] [address] Phone: [phone] Fax: [phone]     Social Drivers of Health (SDOH) Social History: SDOH Screenings   Food Insecurity: No Food Insecurity (12/30/2023)  Housing: Low Risk  (12/30/2023)  Transportation Needs: No Transportation Needs (12/30/2023)  Utilities: Not At Risk (12/30/2023)  Social Connections: Socially Isolated (12/30/2023)  Tobacco Use: Medium Risk (02/15/2023)   SDOH Interventions:     Readmission Risk Interventions    02/20/2023    2:49 PM  Readmission Risk Prevention Plan  Transportation Screening Complete  PCP or Specialist Appt within 5-7 Days Complete  Home Care Screening Complete  Medication Review (RN CM) Complete

## 2024-01-03 ENCOUNTER — Inpatient Hospital Stay (HOSPITAL_COMMUNITY)

## 2024-01-03 DIAGNOSIS — S72001A Fracture of unspecified part of neck of right femur, initial encounter for closed fracture: Secondary | ICD-10-CM | POA: Diagnosis not present

## 2024-01-03 DIAGNOSIS — D62 Acute posthemorrhagic anemia: Secondary | ICD-10-CM | POA: Diagnosis not present

## 2024-01-03 DIAGNOSIS — J9601 Acute respiratory failure with hypoxia: Secondary | ICD-10-CM | POA: Diagnosis not present

## 2024-01-03 DIAGNOSIS — U071 COVID-19: Secondary | ICD-10-CM | POA: Diagnosis not present

## 2024-01-03 LAB — CBC WITH DIFFERENTIAL/PLATELET
Abs Immature Granulocytes: 0.07 K/uL (ref 0.00–0.07)
Basophils Absolute: 0 K/uL (ref 0.0–0.1)
Basophils Relative: 0 %
Eosinophils Absolute: 0 K/uL (ref 0.0–0.5)
Eosinophils Relative: 0 %
HCT: 22.9 % — ABNORMAL LOW (ref 39.0–52.0)
Hemoglobin: 7.9 g/dL — ABNORMAL LOW (ref 13.0–17.0)
Immature Granulocytes: 1 %
Lymphocytes Relative: 8 %
Lymphs Abs: 0.7 K/uL (ref 0.7–4.0)
MCH: 31.6 pg (ref 26.0–34.0)
MCHC: 34.5 g/dL (ref 30.0–36.0)
MCV: 91.6 fL (ref 80.0–100.0)
Monocytes Absolute: 0.4 K/uL (ref 0.1–1.0)
Monocytes Relative: 4 %
Neutro Abs: 8 K/uL — ABNORMAL HIGH (ref 1.7–7.7)
Neutrophils Relative %: 87 %
Platelets: 112 K/uL — ABNORMAL LOW (ref 150–400)
RBC: 2.5 MIL/uL — ABNORMAL LOW (ref 4.22–5.81)
RDW: 16.2 % — ABNORMAL HIGH (ref 11.5–15.5)
WBC: 9.2 K/uL (ref 4.0–10.5)
nRBC: 0 % (ref 0.0–0.2)

## 2024-01-03 LAB — COMPREHENSIVE METABOLIC PANEL WITH GFR
ALT: 12 U/L (ref 0–44)
AST: 17 U/L (ref 15–41)
Albumin: 2.2 g/dL — ABNORMAL LOW (ref 3.5–5.0)
Alkaline Phosphatase: 37 U/L — ABNORMAL LOW (ref 38–126)
Anion gap: 10 (ref 5–15)
BUN: 60 mg/dL — ABNORMAL HIGH (ref 8–23)
CO2: 22 mmol/L (ref 22–32)
Calcium: 7.7 mg/dL — ABNORMAL LOW (ref 8.9–10.3)
Chloride: 105 mmol/L (ref 98–111)
Creatinine, Ser: 1.5 mg/dL — ABNORMAL HIGH (ref 0.61–1.24)
GFR, Estimated: 46 mL/min — ABNORMAL LOW (ref >60)
Glucose, Bld: 140 mg/dL — ABNORMAL HIGH (ref 70–99)
Potassium: 5 mmol/L (ref 3.5–5.1)
Sodium: 137 mmol/L (ref 135–145)
Total Bilirubin: 0.8 mg/dL (ref 0.0–1.2)
Total Protein: 4.8 g/dL — ABNORMAL LOW (ref 6.5–8.1)

## 2024-01-03 LAB — MAGNESIUM: Magnesium: 2.5 mg/dL — ABNORMAL HIGH (ref 1.7–2.4)

## 2024-01-03 LAB — PHOSPHORUS: Phosphorus: 4.2 mg/dL (ref 2.5–4.6)

## 2024-01-03 LAB — D-DIMER, QUANTITATIVE: D-Dimer, Quant: 2.77 ug{FEU}/mL — ABNORMAL HIGH (ref 0.00–0.50)

## 2024-01-03 LAB — FIBRINOGEN: Fibrinogen: 150 mg/dL — ABNORMAL LOW (ref 210–475)

## 2024-01-03 LAB — FERRITIN: Ferritin: 52 ng/mL (ref 24–336)

## 2024-01-03 LAB — SEDIMENTATION RATE: Sed Rate: 1 mm/h (ref 0–16)

## 2024-01-03 LAB — VITAMIN D 25 HYDROXY (VIT D DEFICIENCY, FRACTURES): Vit D, 25-Hydroxy: 11.33 ng/mL — ABNORMAL LOW (ref 30–100)

## 2024-01-03 LAB — C-REACTIVE PROTEIN: CRP: 0.7 mg/dL (ref <1.0)

## 2024-01-03 LAB — LACTATE DEHYDROGENASE: LDH: 166 U/L (ref 105–235)

## 2024-01-03 MED ORDER — VITAMIN D (ERGOCALCIFEROL) 1.25 MG (50000 UNIT) PO CAPS
50000.0000 [IU] | ORAL_CAPSULE | ORAL | Status: DC
  Administered 2024-01-03: 50000 [IU] via ORAL
  Filled 2024-01-03: qty 1

## 2024-01-03 MED ORDER — BISACODYL 10 MG RE SUPP
10.0000 mg | Freq: Every day | RECTAL | Status: DC | PRN
  Filled 2024-01-03: qty 1

## 2024-01-03 MED ORDER — VITAMIN D 25 MCG (1000 UNIT) PO TABS
2000.0000 [IU] | ORAL_TABLET | Freq: Every day | ORAL | Status: DC
  Administered 2024-01-03 – 2024-01-05 (×3): 2000 [IU] via ORAL
  Filled 2024-01-03 (×3): qty 2

## 2024-01-03 MED ORDER — POLYETHYLENE GLYCOL 3350 17 G PO PACK
17.0000 g | PACK | Freq: Two times a day (BID) | ORAL | Status: DC
  Administered 2024-01-03 – 2024-01-05 (×5): 17 g via ORAL
  Filled 2024-01-03 (×5): qty 1

## 2024-01-03 MED ORDER — SENNOSIDES-DOCUSATE SODIUM 8.6-50 MG PO TABS
1.0000 | ORAL_TABLET | Freq: Two times a day (BID) | ORAL | Status: DC
  Administered 2024-01-03 – 2024-01-05 (×5): 1 via ORAL
  Filled 2024-01-03 (×5): qty 1

## 2024-01-03 NOTE — Progress Notes (Signed)
 PROGRESS NOTE    [name]  FMW:[id number] DOB: [birth year] DOA: 12/30/2023 PCP: Charlott Dorn LABOR, MD   Brief Narrative:  [name] is a 83 y.o. male with medical history significant of CAD, hypothyroidism, hypertension, dyslipidemia, dementia with protein calorie malnutrition and failure to thrive, GERD, previous stage II sacral decubitus, CKD 3 who was sent to the ER after an unwitnessed fall in the bathroom resulting in right-sided hip deformity and pain with shortening and rotation.  Also was Positive for COVID and was symptomatic on 11/14. Orthopedic Surgery consulted and took the patient to the OR for Closed Reduction and Intramedullary Fixation of the Right Intertrochanteric Fx on 11/15. He is steadily improving and had to be transfused pRBCs but appears stable.  Can discharge to SNF but likely needs quarantine period.   Assessment and Plan:  Suspected Mechanical Unwitnessed Fall with Acute comminuted fracture of Right Femur: Orthopedic Surgery Consulted and took patient for Operative Intervention on 11/15. VTE Prophylaxis per Orthopedic Surgery and now on ASA 81 mg po BID. PT/OT to evaluate and recommending SNF. Pain Control and Bowel Regimen adjusted. IVF as below now stopped. Appears to have improved and appears stable to D/C to SNF; Vitamin D ,25-Hydroxy was 11.33. Start po Cholecalciferol  2000 international units Daily   Possible Syncopal Episode: Continue Telemetry Monitoring;  PT/OT are recommending SNF; Check Orthostatic VS in the AM. IVF w/ NS @ 75 mL/hr now stopped. Checked a Transthoracic Echocardiogram showed an EF of 40 to 45% with no regional wall motion abnormalities and grade 1 diastolic dysfunction with right ventricular systolic function being normal.    COVID-19 Infection: Patient was actually diagnosed days prior to admission. Fevers have defervesced. He was on Paxlovid prior to admission which is not continued. -Supportive care and C/w Dexamethasone  6 mg  Daily. Inflammatory Marker Trend improving  Recent Labs    01/01/24 0414 01/02/24 0509 01/03/24 0510  DDIMER 7.76* 4.08* 2.77*  FERRITIN 78 68 52  LDH 161 177 166  CRP 2.8* 1.1* 0.7  Fibrinogen  improving and is now 150; ESR is now 1 Lab Results  Component Value Date   SARSCOV2NAA POSITIVE (A) 12/30/2023   SARSCOV2NAA NEGATIVE 09/14/2021   SARSCOV2NAA NEGATIVE 07/08/2020   SARSCOV2NAA NEGATIVE 07/06/2020  -Add Guaifenesin  1200 mg po BID will continue Albuterol  2.5 mg neb every 4 as needed for wheezing or shortness of breath -SNF and per TOC will need a 10-day quarantine prior to being accepted but some SNF's require 5 Day quarantine periods  Acute Respiratory Failure w/ Hypoxia in the setting of Above: Patient continues to be a little bit hypoxic and on ambulation did drop.  Remains on 2 L of supplemental oxygen and will continue flutter valve and incentive spirometer.  Repeat chest x-ray in AM.  Chest x-ray again today showed No acute cardiopulmonary findings and Emphysema.   AKI on CKD Stage 3a / Elevated AG Metabolic Acidosis: B/L Cr is ~1.3. BUN/Cr Trend: Recent Labs  Lab 12/30/23 0053 12/31/23 0714 01/01/24 0414 01/02/24 0509 01/03/24 0510  BUN 35* 55* 59* 63* 60*  CREATININE 1.52* 2.19* 1.92* 1.81* 1.50*  - IV fluid hydration is now stopped.metabolic acidosis is improved and he has a CO2 22, anion gap of 10, chloride level 105. Avoid Nephrotoxic Medications, Contrast Dyes, Hypotension and Dehydration to Ensure Adequate Renal Perfusion and will need to Renally Adjust Meds. CTM and Trend Renal Function carefully and repeat CMP in the AM   CAD/Essential Hypertension/Dyslipidemia: Continue Pravastatin  40 mg po at bedtime.  Diuretics on hold  Hyperkalemia: K+ is now gone from 5.2 -> 4.9 -> 5.0 S/p 2 doses of 10 grams of Lokelma  x1. C/w IVF. CTM and Trend and repeat CMP in the AM  Leukocytosis: In the setting of Surgery and steroid Demargination. WBC went from 6.4 -> 11.5 -> 10.6  -> 9.9 -> 9.2. Check Inflammatory Markers for COVID. CTM for S/Sx of Infection. CXR showed no acute cardiopulmonary disease. Repeat CBC in the AM  ABLA as a postoperative Cause superimposed on Macrocytic Anemia: Hgb/Hct went from 11.5/35.8 -> 7.0/21.0 -> 6.2/18.6  w/ MCV of 101.6 and is now gone to 8.6/25.8 -> 7.9/24.1 -> 7.9/22.9 w/ MCV of 91.6. Checked Anemia Panel and showed an iron  level of 18, UIBC of 192, TIBC of 210, saturation ratios of 9%, ferritin of 78, folate level greater than 20 and a vitamin B12 level of 566.  With drop in hemoglobin we will Type and Screen and transfuse 1 unit PRBCs CTM For S/Sx of Bleeding; No overt bleeding noted. Repeat CBC in the AM  Thrombocytopenia: Plt Count went from 153 -> 110 -> 98 -> 100 -> 112. CTM for S/Sx of Bleeding; No overt bleeding noted. Repeat CBC in the AM   Dementia: Pleasant @ baseline. C/w Divalproex  250 mg po BID, Memantine  10 mg po BID, and Sertraline  25 mg po Daily; C/w Delirium Precautions  GERD/GI Prophylaxis: C/w PPI with Pantoprazole  40 mg po daily   Hypoalbuminemia: Patient's Albumin  Lvl was 2.6 -> 2.2 -> 2.5 -> 2.2. CTM and Trend and repeat CMP in the AM  Protein Calorie Malnutrition/Failure to Thrive: Nutrition Consultation and now on Ensure Plus High Protein 237 mL po BID  Hx stage 2 Decubitus Ulcer:  Wound 12/30/23 0948 Pressure Injury Sacrum Medial;Upper Stage 1 -  Intact skin with non-blanchable redness of a localized area usually over a bony prominence. (Active)    DVT prophylaxis: SCDs Start: 12/30/23 0845    Code Status: Limited: Do not attempt resuscitation (DNR) -DNR-LIMITED -Do Not Intubate/DNI  Family Communication: No family present @ bedside   Disposition Plan:  Level of care: Telemetry Status is: Inpatient Remains inpatient appropriate because: Needs SNF and appears medically stable for D/C   Consultants:  Orthopedic Surgery  Procedures:  As delineated as above; Echocardiogram  Antimicrobials:   Anti-infectives (From admission, onward)    None       Subjective: Seen and examined at bedside and was doing okay.  Still has not had a bowel movement yet.  Denies any current pain.  No other concerns or complaints at this time.  Objective: Vitals:   01/02/24 1508 01/02/24 2000 01/03/24 0528 01/03/24 0725  BP: (!) 127/51 116/61 (!) 127/50 (!) 123/52  Pulse: 61 60 63 62  Resp: 16   16  Temp: 99 F (37.2 C) 97.8 F (36.6 C)    TempSrc:  Oral    SpO2: 100% 100% 100% 98%  Weight:      Height:        Intake/Output Summary (Last 24 hours) at 01/03/2024 1323 Last data filed at 01/03/2024 9178 Gross per 24 hour  Intake 480 ml  Output 900 ml  Net -420 ml   Filed Weights   12/30/23 1116  Weight: 73.9 kg   Examination: Physical Exam:  Constitutional: Chronically ill-appearing elderly Caucasian male in no acute distress Respiratory: Diminished to auscultation bilaterally with some coarse breath sounds and has some slight rhonchi but no appreciable wheezing, rales or crackles. Normal respiratory effort and patient  is not tachypenic. No accessory muscle use.  Wearing supplemental oxygen via nasal cannula but does not have any increased respiratory effort or labored breathing Cardiovascular: RRR, no murmurs / rubs / gallops. S1 and S2 auscultated. No extremity edema. Abdomen: Soft, non-tender, non-distended. Bowel sounds positive.  GU: Deferred. Musculoskeletal: No clubbing / cyanosis of digits/nails. No joint deformity upper and lower extremities. Skin: No rashes, lesions, ulcers on a limited skin evaluation. No induration; Warm and dry.  Neurologic: CN 2-12 grossly intact with no focal deficits. Romberg sign and cerebellar reflexes not assessed.  Psychiatric: He is awake and alert and appears calm  Data Reviewed: I have personally reviewed following labs and imaging studies  CBC: Recent Labs  Lab 12/30/23 0053 12/31/23 0714 01/01/24 0414 01/01/24 2017 01/02/24 0509  01/03/24 0510  WBC 6.4 11.5* 10.6*  --  9.9 9.2  NEUTROABS 4.3  --  9.2*  --  8.6* 8.0*  HGB 11.5* 7.0* 6.2* 8.6* 7.9* 7.9*  HCT 35.8* 21.0* 18.6* 25.8* 24.1* 22.9*  MCV 103.2* 102.4* 101.6*  --  95.6 91.6  PLT 153 110* 98*  --  100* 112*   Basic Metabolic Panel: Recent Labs  Lab 12/30/23 0053 12/30/23 0750 12/31/23 0714 01/01/24 0414 01/02/24 0509 01/03/24 0510  NA 136  --  137 137 138 137  K 4.4  --  5.3* 5.2* 4.9 5.0  CL 100  --  100 105 104 105  CO2 22  --  21* 22 21* 22  GLUCOSE 117*  --  155* 147* 136* 140*  BUN 35*  --  55* 59* 63* 60*  CREATININE 1.52*  --  2.19* 1.92* 1.81* 1.50*  CALCIUM  8.1*  --  7.6* 7.3* 7.8* 7.7*  MG  --  2.0  --  2.2 2.3 2.5*  PHOS  --   --   --  5.6* 4.6 4.2   GFR: Estimated Creatinine Clearance: 38.3 mL/min (A) (by C-G formula based on SCr of 1.5 mg/dL (H)). Liver Function Tests: Recent Labs  Lab 12/30/23 0053 01/01/24 0414 01/02/24 0509 01/03/24 0510  AST 23 23 22 17   ALT 17 12 13 12   ALKPHOS 74 39 39 37*  BILITOT 0.4 0.7 0.8 0.8  PROT 5.8* 4.7* 5.3* 4.8*  ALBUMIN  2.6* 2.2* 2.5* 2.2*   No results for input(s): LIPASE, AMYLASE in the last 168 hours. No results for input(s): AMMONIA in the last 168 hours. Coagulation Profile: Recent Labs  Lab 12/30/23 0750  INR 1.1   Cardiac Enzymes: No results for input(s): CKTOTAL, CKMB, CKMBINDEX, TROPONINI in the last 168 hours. BNP (last 3 results) No results for input(s): PROBNP in the last 8760 hours. HbA1C: No results for input(s): HGBA1C in the last 72 hours. CBG: No results for input(s): GLUCAP in the last 168 hours. Lipid Profile: No results for input(s): CHOL, HDL, LDLCALC, TRIG, CHOLHDL, LDLDIRECT in the last 72 hours. Thyroid  Function Tests: No results for input(s): TSH, T4TOTAL, FREET4, T3FREE, THYROIDAB in the last 72 hours. Anemia Panel: Recent Labs    01/01/24 0414 01/02/24 0509 01/03/24 0510  VITAMINB12 566  --   --    FOLATE >20.0  --   --   FERRITIN 78 68 52  TIBC 210*  --   --   IRON  18*  --   --   RETICCTPCT 2.9  --   --    Sepsis Labs: No results for input(s): PROCALCITON, LATICACIDVEN in the last 168 hours.  Recent Results (from the past 240 hours)  Resp panel by RT-PCR (RSV, Flu A&B, Covid) Anterior Nasal Swab     Status: Abnormal   Collection Time: 12/30/23  2:02 AM   Specimen: Anterior Nasal Swab  Result Value Ref Range Status   SARS Coronavirus 2 by RT PCR POSITIVE (A) NEGATIVE Final   Influenza A by PCR NEGATIVE NEGATIVE Final   Influenza B by PCR NEGATIVE NEGATIVE Final    Comment: (NOTE) The Xpert Xpress SARS-CoV-2/FLU/RSV plus assay is intended as an aid in the diagnosis of influenza from Nasopharyngeal swab specimens and should not be used as a sole basis for treatment. Nasal washings and aspirates are unacceptable for Xpert Xpress SARS-CoV-2/FLU/RSV testing.  Fact Sheet for Patients: bloggercourse.com  Fact Sheet for Healthcare Providers: seriousbroker.it  This test is not yet approved or cleared by the [place]  FDA and has been authorized for detection and/or diagnosis of SARS-CoV-2 by FDA under an Emergency Use Authorization (EUA). This EUA will remain in effect (meaning this test can be used) for the duration of the COVID-19 declaration under Section 564(b)(1) of the Act, 21 U.S.C. section 360bbb-3(b)(1), unless the authorization is terminated or revoked.     Resp Syncytial Virus by PCR NEGATIVE NEGATIVE Final    Comment: (NOTE) Fact Sheet for Patients: bloggercourse.com  Fact Sheet for Healthcare Providers: seriousbroker.it  This test is not yet approved or cleared by the [place]  FDA and has been authorized for detection and/or diagnosis of SARS-CoV-2 by FDA under an Emergency Use Authorization (EUA). This EUA will remain in effect (meaning this  test can be used) for the duration of the COVID-19 declaration under Section 564(b)(1) of the Act, 21 U.S.C. section 360bbb-3(b)(1), unless the authorization is terminated or revoked.  Performed at [facility], [address], [place], [place] [address]   Surgical pcr screen     Status: None   Collection Time: 12/30/23 11:02 AM   Specimen: Nasal Mucosa; Nasal Swab  Result Value Ref Range Status   MRSA, PCR NEGATIVE NEGATIVE Final   Staphylococcus aureus NEGATIVE NEGATIVE Final    Comment: (NOTE) The Xpert SA Assay (FDA approved for NASAL specimens in patients 72 years of age and older), is one component of a comprehensive surveillance program. It is not intended to diagnose infection nor to guide or monitor treatment. Performed at [facility], [address], [place], [place] [address]      Radiology Studies: DG CHEST PORT 1 VIEW Result Date: 01/03/2024 CLINICAL DATA:  COVID. EXAM: PORTABLE CHEST 1 VIEW COMPARISON:  01/02/2024. FINDINGS: The heart size and mediastinal contours are unchanged. Aortic atherosclerosis. Emphysema. No focal consolidation, pleural effusion, or pneumothorax. No acute osseous abnormality. IMPRESSION: 1. No acute cardiopulmonary findings. 2. Emphysema. Electronically Signed   By: Harrietta Sherry M.D.   On: 01/03/2024 11:38   DG CHEST PORT 1 VIEW Result Date: 01/02/2024 EXAM: 1 VIEW(S) XRAY OF THE CHEST 01/02/2024 06:14:31 AM COMPARISON: 01/01/2024 CLINICAL HISTORY: COVID FINDINGS: LUNGS AND PLEURA: Emphysema. No pleural effusion. No pneumothorax. HEART AND MEDIASTINUM: Aortic atherosclerosis. No acute abnormality of the cardiac silhouette. BONES AND SOFT TISSUES: No acute osseous abnormality. IMPRESSION: 1. No acute cardiopulmonary process. 2. Aortic atherosclerosis. 3. Emphysema. Electronically signed by: Ryan Salvage MD 01/02/2024 11:51 AM EST RP Workstation: [id number]   Scheduled Meds:  sodium chloride    Intravenous Once   aspirin  EC   81 mg Oral BID   cholecalciferol   2,000 Units Oral Daily   dexamethasone  (DECADRON ) injection  6 mg Intravenous Daily  divalproex   250 mg Oral BID   feeding supplement  237 mL Oral BID BM   guaiFENesin   1,200 mg Oral BID   loperamide   2 mg Oral Daily   memantine   10 mg Oral BID   methocarbamol   500-1,000 mg Oral QID   multivitamin with minerals  1 tablet Oral Q breakfast   pantoprazole   40 mg Oral QAC breakfast   polyethylene glycol  17 g Oral BID   pravastatin   40 mg Oral QHS   senna-docusate  1 tablet Oral BID   sertraline  25 mg Oral Daily   sodium chloride  flush  3 mL Intravenous Q12H   Vitamin D  (Ergocalciferol )  50,000 Units Oral Q7 days   Continuous Infusions:   LOS: 4 days   Alejandro Marker, DO [facility] Available via Epic secure chat 7am-7pm After these hours, please refer to coverage provider listed on [email or web address] 01/03/2024, 1:23 PM

## 2024-01-03 NOTE — Progress Notes (Signed)
 Physical Therapy Treatment Patient Details Name: [name] MRN: [id number] DOB: [birth year] Today's Date: 01/03/2024   History of Present Illness Patient is an 83 year old male who presented 11/15 s/p fall with intertrochanteric right hip fracture.  Underwent intramedullary fixation 12/30/23. COVID+. PMH significant for dementia, CRF, CAD, DDD, GERD.    PT Comments  The pt is making gradual functional progress, now only needing modAx1 for bed mobility and to transfer to stand from an elevated surface. He needs step-by-step cues to sequence bed mobility and reminders to push up from the bed rather than pull up on the RW for transfers though. Performed x3 bouts of pre-gait/gait training with pt marching in place then progressing to a couple steps anterior <> posterior at EOB to laterally stepping at EOB. He remains limited by pain in his R leg, impacting his ability to weight shift to his R leg and thereby lift and step with his L. He remains at high risk for falls. Will continue to follow acutely.   If plan is discharge home, recommend the following: Two people to help with bathing/dressing/bathroom;Two people to help with walking and/or transfers;Help with stairs or ramp for entrance;Assistance with cooking/housework;Assist for transportation;Direct supervision/assist for financial management;Direct supervision/assist for medications management   Can travel by private vehicle     No  Equipment Recommendations  BSC/3in1;Wheelchair (measurements PT);Wheelchair cushion (measurements PT);Hospital bed (has RW)    Recommendations for Other Services       Precautions / Restrictions Precautions Precautions: Fall Recall of Precautions/Restrictions: Impaired Precaution/Restrictions Comments: COVID+ Restrictions Weight Bearing Restrictions Per Provider Order: Yes RLE Weight Bearing Per Provider Order: Weight bearing as tolerated     Mobility  Bed Mobility Overal bed mobility: Needs  Assistance Bed Mobility: Supine to Sit, Sit to Supine     Supine to sit: Mod assist, HOB elevated, Used rails Sit to supine: Mod assist, HOB elevated   General bed mobility comments: Step-by-step cues provided for pt to bring each leg off L EOB one at a time, needing assistance at bil legs (R>L). Cued pt to grab L rail with R UE and come up on L elbow to ascend trunk, modA at trunk and hips to scoot to L EOB. ModA to lift bil legs with return to supine, cuing pt to lean laterally to L elbow to descend trunk    Transfers Overall transfer level: Needs assistance Equipment used: Rolling walker (2 wheels) Transfers: Sit to/from Stand, Bed to chair/wheelchair/BSC Sit to Stand: Mod assist, From elevated surface   Step pivot transfers: Mod assist, +2 physical assistance, +2 safety/equipment       General transfer comment: Daughter present and providing intermittent +2 assistance/safety. Cues provided for pt to push up from bed with 1-2 UEs rather than pull up on RW to stand, x3 reps from elevated EOB with modA to power up and gain balance each rep. ModA for balance, RW management, and cuing for pt to step pivot to L along EOB to transfer towards HOB.    Ambulation/Gait Ambulation/Gait assistance: Mod assist, +2 safety/equipment, +2 physical assistance Gait Distance (Feet): 3 Feet (x2 bouts of ~2-3 ft each bout) Assistive device: Rolling walker (2 wheels) Gait Pattern/deviations: Step-to pattern, Decreased step length - right, Decreased stance time - right, Decreased stride length, Decreased weight shift to right, Trunk flexed, Antalgic Gait velocity: reduced Gait velocity interpretation: <1.31 ft/sec, indicative of household ambulator   General Gait Details: Pt stands with a flexed posture despite cues to stand upright and push down  through RW to offload R leg during R stance phase to allow pt to step with his L foot. Pt performed x3 pre gait/ambulation bouts. First bout pt marched in place  at EOB 3x, needing cues to weight shift and lift contralateral foot. Second bout, pt stepped anterior <> posterior at EOB, needing cues to sequence steps and RW. Third bout, pt step pivoted to L along EOB, often more pivoting heel-to-toe on each foot rather than lifting to take steps. ModAx1-2 (daughter providing intermittent +2) for balance, cuing, and RW management each bout   Stairs             Wheelchair Mobility     Tilt Bed    Modified Rankin (Stroke Patients Only)       Balance Overall balance assessment: Needs assistance Sitting-balance support: Feet supported, No upper extremity supported Sitting balance-Leahy Scale: Fair Sitting balance - Comments: CGA sitting statically EOB   Standing balance support: Bilateral upper extremity supported, During functional activity, Reliant on assistive device for balance Standing balance-Leahy Scale: Poor Standing balance comment: dependent on RW and external support                            Communication Communication Communication: Impaired Factors Affecting Communication: Reduced clarity of speech  Cognition Arousal: Alert Behavior During Therapy: WFL for tasks assessed/performed   PT - Cognitive impairments: History of cognitive impairments                       PT - Cognition Comments: hx of dementia. Needs multi-modal cues and step-by-step cuing to sequence mobility. Following commands: Impaired Following commands impaired: Follows one step commands with increased time, Only follows one step commands consistently, Follows multi-step commands inconsistently, Follows multi-step commands with increased time    Cueing Cueing Techniques: Verbal cues, Visual cues, Gestural cues, Tactile cues  Exercises General Exercises - Lower Extremity Quad Sets: AROM, Both, 5 reps, Supine Heel Slides: AROM, AAROM, Both, 5 reps, Supine (AAROM on R, AROM on L) Hip ABduction/ADduction: AROM, AAROM, Both, 5 reps,  Supine (AAROM on R, AROM on L)    General Comments        Pertinent Vitals/Pain Pain Assessment Pain Assessment: Faces Faces Pain Scale: Hurts whole lot Pain Location: RLE Pain Descriptors / Indicators: Discomfort, Grimacing, Moaning, Guarding, Operative site guarding Pain Intervention(s): Monitored during session, Limited activity within patient's tolerance, Repositioned, Patient requesting pain meds-RN notified    Home Living                          Prior Function            PT Goals (current goals can now be found in the care plan section) Acute Rehab PT Goals Patient Stated Goal: to reduce pain and walk PT Goal Formulation: With patient/family Time For Goal Achievement: 01/14/24 Potential to Achieve Goals: Good Progress towards PT goals: Progressing toward goals    Frequency    Min 2X/week      PT Plan      Co-evaluation              AM-PAC PT 6 Clicks Mobility   Outcome Measure  Help needed turning from your back to your side while in a flat bed without using bedrails?: A Lot Help needed moving from lying on your back to sitting on the side of a flat bed without using  bedrails?: A Lot Help needed moving to and from a bed to a chair (including a wheelchair)?: Total Help needed standing up from a chair using your arms (e.g., wheelchair or bedside chair)?: A Lot Help needed to walk in hospital room?: Total Help needed climbing 3-5 steps with a railing? : Total 6 Click Score: 9    End of Session Equipment Utilized During Treatment: Gait belt;Oxygen Activity Tolerance: Patient limited by pain Patient left: in bed;with call bell/phone within reach;with bed alarm set;with family/visitor present Nurse Communication: Mobility status;Patient requests pain meds PT Visit Diagnosis: Unsteadiness on feet (R26.81);Other abnormalities of gait and mobility (R26.89);Repeated falls (R29.6);Muscle weakness (generalized) (M62.81);History of falling  (Z91.81);Difficulty in walking, not elsewhere classified (R26.2);Pain Pain - Right/Left: Right Pain - part of body: Leg     Time: 8360-8293 PT Time Calculation (min) (ACUTE ONLY): 27 min  Charges:    $Gait Training: 8-22 mins $Therapeutic Activity: 8-22 mins PT General Charges $$ ACUTE PT VISIT: 1 Visit                     Theo Ferretti, PT, DPT Acute Rehabilitation Services  Office: [phone]    Theo CHRISTELLA Ferretti 01/03/2024, 5:39 PM

## 2024-01-04 ENCOUNTER — Inpatient Hospital Stay (HOSPITAL_COMMUNITY)

## 2024-01-04 DIAGNOSIS — J9601 Acute respiratory failure with hypoxia: Secondary | ICD-10-CM | POA: Diagnosis not present

## 2024-01-04 DIAGNOSIS — U071 COVID-19: Secondary | ICD-10-CM | POA: Diagnosis not present

## 2024-01-04 DIAGNOSIS — S72001A Fracture of unspecified part of neck of right femur, initial encounter for closed fracture: Secondary | ICD-10-CM | POA: Diagnosis not present

## 2024-01-04 DIAGNOSIS — D62 Acute posthemorrhagic anemia: Secondary | ICD-10-CM | POA: Diagnosis not present

## 2024-01-04 LAB — PHOSPHORUS: Phosphorus: 3.5 mg/dL (ref 2.5–4.6)

## 2024-01-04 LAB — CBC WITH DIFFERENTIAL/PLATELET
Abs Immature Granulocytes: 0.11 K/uL — ABNORMAL HIGH (ref 0.00–0.07)
Basophils Absolute: 0 K/uL (ref 0.0–0.1)
Basophils Relative: 0 %
Eosinophils Absolute: 0 K/uL (ref 0.0–0.5)
Eosinophils Relative: 0 %
HCT: 28 % — ABNORMAL LOW (ref 39.0–52.0)
Hemoglobin: 9.2 g/dL — ABNORMAL LOW (ref 13.0–17.0)
Immature Granulocytes: 1 %
Lymphocytes Relative: 9 %
Lymphs Abs: 1.3 K/uL (ref 0.7–4.0)
MCH: 32.2 pg (ref 26.0–34.0)
MCHC: 32.9 g/dL (ref 30.0–36.0)
MCV: 97.9 fL (ref 80.0–100.0)
Monocytes Absolute: 0.9 K/uL (ref 0.1–1.0)
Monocytes Relative: 6 %
Neutro Abs: 11.4 K/uL — ABNORMAL HIGH (ref 1.7–7.7)
Neutrophils Relative %: 84 %
Platelets: 182 K/uL (ref 150–400)
RBC: 2.86 MIL/uL — ABNORMAL LOW (ref 4.22–5.81)
RDW: 15.7 % — ABNORMAL HIGH (ref 11.5–15.5)
WBC: 13.7 K/uL — ABNORMAL HIGH (ref 4.0–10.5)
nRBC: 0.4 % — ABNORMAL HIGH (ref 0.0–0.2)

## 2024-01-04 LAB — COMPREHENSIVE METABOLIC PANEL WITH GFR
ALT: 16 U/L (ref 0–44)
AST: 21 U/L (ref 15–41)
Albumin: 2.6 g/dL — ABNORMAL LOW (ref 3.5–5.0)
Alkaline Phosphatase: 44 U/L (ref 38–126)
Anion gap: 12 (ref 5–15)
BUN: 56 mg/dL — ABNORMAL HIGH (ref 8–23)
CO2: 20 mmol/L — ABNORMAL LOW (ref 22–32)
Calcium: 7.7 mg/dL — ABNORMAL LOW (ref 8.9–10.3)
Chloride: 103 mmol/L (ref 98–111)
Creatinine, Ser: 1.54 mg/dL — ABNORMAL HIGH (ref 0.61–1.24)
GFR, Estimated: 44 mL/min — ABNORMAL LOW (ref >60)
Glucose, Bld: 119 mg/dL — ABNORMAL HIGH (ref 70–99)
Potassium: 4.8 mmol/L (ref 3.5–5.1)
Sodium: 135 mmol/L (ref 135–145)
Total Bilirubin: 0.8 mg/dL (ref 0.0–1.2)
Total Protein: 5.3 g/dL — ABNORMAL LOW (ref 6.5–8.1)

## 2024-01-04 LAB — MAGNESIUM: Magnesium: 2.5 mg/dL — ABNORMAL HIGH (ref 1.7–2.4)

## 2024-01-04 NOTE — Progress Notes (Signed)
 PROGRESS NOTE    [name]  FMW:[id number] DOB: March 07, 1940 DOA: 12/30/2023 PCP: Charlott Dorn LABOR, MD   Brief Narrative:  [name] is a 83 y.o. male with medical history significant of CAD, hypothyroidism, hypertension, dyslipidemia, dementia with protein calorie malnutrition and failure to thrive, GERD, previous stage II sacral decubitus, CKD 3 who was sent to the ER after an unwitnessed fall in the bathroom resulting in right-sided hip deformity and pain with shortening and rotation.  Also was Positive for COVID and was symptomatic on 11/14. Orthopedic Surgery consulted and took the patient to the OR for Closed Reduction and Intramedullary Fixation of the Right Intertrochanteric Fx on 11/15. He is steadily improving and had to be transfused pRBCs but appears stable.  Can discharge to SNF but likely needs quarantine period and SNF can take the patient 11/25.    Assessment and Plan:  Suspected Mechanical Unwitnessed Fall with Acute comminuted fracture of Right Femur: Orthopedic Surgery Consulted and took patient for Operative Intervention on 11/15. VTE Prophylaxis per Orthopedic Surgery and now on ASA 81 mg po BID. PT/OT to evaluate and recommending SNF. Pain Control and Bowel Regimen adjusted. IVF as below now stopped. Appears to have improved and appears stable to D/C to SNF; Vitamin D ,25-Hydroxy was 11.33. Start po Cholecalciferol 2000 international units Daily   Possible Syncopal Episode: Continue Telemetry Monitoring;  PT/OT are recommending SNF; Check Orthostatic VS in the AM. IVF w/ NS @ 75 mL/hr now stopped. Checked a Transthoracic Echocardiogram showed an EF of 40 to 45% with no regional wall motion abnormalities and grade 1 diastolic dysfunction with right ventricular systolic function being normal.    COVID-19 Infection: Patient was actually diagnosed days prior to admission. Fevers have defervesced. He was on Paxlovid prior to admission which is not  continued. -Supportive care and C/w Dexamethasone  6 mg Daily x 10 days. Inflammatory [name] Trend improving  Recent Labs    01/02/24 0509 01/03/24 0510  DDIMER 4.08* 2.77*  FERRITIN 68 52  LDH 177 166  CRP 1.1* 0.7  Fibrinogen improving and is now 150; ESR is now 1 Lab Results  Component Value Date   SARSCOV2NAA POSITIVE (A) 12/30/2023   SARSCOV2NAA NEGATIVE 09/14/2021   SARSCOV2NAA NEGATIVE 07/08/2020   SARSCOV2NAA NEGATIVE 07/06/2020  -Add Guaifenesin  1200 mg po BID will continue Albuterol  2.5 mg neb every 4 as needed for wheezing or shortness of breath -SNF and per TOC will need a 10-day quarantine prior to being accepted and cannot discharge until 11/25  Acute Respiratory Failure w/ Hypoxia in the setting of Above: Patient continued to be a little bit hypoxic and on ambulation did drop.  Now off on 2 L of supplemental oxygen and will continue flutter valve and incentive spirometer.  Repeat chest x-ray this AM showed New 7 mm nodular density projecting over the right lower lung, indeterminate; recommend dedicated upright PA and lateral chest radiograph for further evaluation. Repeat CXR in the AM   AKI on CKD Stage 3a / Elevated AG Metabolic Acidosis: B/L Cr is ~1.3. BUN/Cr Trend: Recent Labs  Lab 12/30/23 0053 12/31/23 0714 01/01/24 0414 01/02/24 0509 01/03/24 0510 01/04/24 1055  BUN 35* 55* 59* 63* 60* 56*  CREATININE 1.52* 2.19* 1.92* 1.81* 1.50* 1.54*  - IV fluid hydration is now stopped. Elevated AG us  improved but still a slight MA as CO2 is20, anion gap of 12, chloride level 103. Avoid Nephrotoxic Medications, Contrast Dyes, Hypotension and Dehydration to Ensure Adequate Renal Perfusion and will need  to Renally Adjust Meds. CTM and Trend Renal Function carefully and repeat CMP in the AM   CAD/Essential Hypertension/Dyslipidemia: Continue Pravastatin  40 mg po at bedtime. Diuretics on hold  Hyperkalemia: K+ is now gone from 5.2 -> 4.9 -> 5.0 -> 4.8. S/p 2 doses of 10  grams of Lokelma  x1. C/w IVF. CTM and Trend and repeat CMP in the AM  Leukocytosis: In the setting of Surgery and steroid Demargination (on daily dexamethasone ). WBC went from 6.4 -> 11.5 -> 10.6 -> 9.9 -> 9.2 -> 13.7. Check Inflammatory Markers for COVID. CTM for S/Sx of Infection. CXR showed no acute cardiopulmonary disease. Repeat CBC in the AM  ABLA as a postoperative Cause superimposed on Macrocytic Anemia: Hgb/Hct went from 11.5/35.8 -> 7.0/21.0 -> 6.2/18.6  w/ MCV of 101.6 and is now gone to 8.6/25.8 -> 7.9/24.1 -> 7.9/22.9 -> 9.2/28.0 w/ MCV of 97.9. Checked Anemia Panel and showed an iron  level of 18, UIBC of 192, TIBC of 210, saturation ratios of 9%, ferritin of 78, folate level greater than 20 and a vitamin B12 level of 566.  With drop in hemoglobin we will Type and Screen and transfuse 1 unit PRBCs CTM For S/Sx of Bleeding; No overt bleeding noted. Repeat CBC in the AM  Thrombocytopenia: Resolved. Plt Count went from 153 -> 110 -> 98 -> 100 -> 112 -> 182. CTM for S/Sx of Bleeding; No overt bleeding noted. Repeat CBC in the AM   Dementia: Pleasant @ baseline. C/w Divalproex  250 mg po BID, Memantine  10 mg po BID, and Sertraline  25 mg po Daily; C/w Delirium Precautions  GERD/GI Prophylaxis: C/w PPI with Pantoprazole  40 mg po daily   Hypoalbuminemia: Patient's Albumin  Lvl was 2.6 -> 2.2 -> 2.5 -> 2.2 -> 2.6. CTM and Trend and repeat CMP in the AM  Protein Calorie Malnutrition/Failure to Thrive: Nutrition Consultation and now on Ensure Plus High Protein 237 mL po BID  Hx stage 2 Decubitus Ulcer:  Wound 12/30/23 0948 Pressure Injury Sacrum Medial;Upper Stage 1 -  Intact skin with non-blanchable redness of a localized area usually over a bony prominence. (Active)    DVT prophylaxis: SCDs Start: 12/30/23 0845    Code Status: Limited: Do not attempt resuscitation (DNR) -DNR-LIMITED -Do Not Intubate/DNI  Family Communication: No family present @ bedside  Disposition Plan:  Level of care:  Telemetry Status is: Inpatient Remains inpatient appropriate because: Appears medically stable for D/C    Consultants:  Orthopedic Surgery   Procedures:  As delineated as above; Echocardiogram   Antimicrobials:  Anti-infectives (From admission, onward)    None       Subjective: Seen and examined at bedside was doing okay and states he still has not had bowel movement.  Was weaned off of supplemental oxygen.  Denies any nausea or vomiting.  Pain is fairly well-controlled.  No other concerns or complaints at this time.  Objective: Vitals:   01/03/24 1430 01/03/24 2015 01/04/24 0357 01/04/24 0808  BP: (!) 108/49 (!) 115/44 (!) 126/57 (!) 119/50  Pulse: 66 (!) 56 (!) 103 60  Resp: 17   16  Temp: 97.7 F (36.5 C) 97.7 F (36.5 C) 98.2 F (36.8 C)   TempSrc: Oral     SpO2: 99% 100% 98% 98%  Weight:      Height:        Intake/Output Summary (Last 24 hours) at 01/04/2024 1420 Last data filed at 01/04/2024 0814 Gross per 24 hour  Intake --  Output 1000 ml  Net -  1000 ml   Filed Weights   12/30/23 1116  Weight: 73.9 kg   Examination: Physical Exam:  Constitutional: Chronically ill-appearing elderly Caucasian male in no acute distress Respiratory: Diminished to auscultation bilaterally, no wheezing, rales, rhonchi or crackles. Normal respiratory effort and patient is not tachypenic. No accessory muscle use. Unlabored breathing  Cardiovascular: RRR, no murmurs / rubs / gallops. S1 and S2 auscultated. No extremity edema.  Abdomen: Soft, non-tender, non-distended. Bowel sounds positive.  GU: Deferred. Musculoskeletal: No clubbing / cyanosis of digits/nails. No joint deformity upper and lower extremities.  Skin: No rashes, lesions, ulcers on a limited skin evaluation. No induration; Warm and dry.  Neurologic: CN 2-12 grossly intact with no focal deficits. Romberg sign and cerebellar reflexes not assessed.  Psychiatric: Awake and alert and appears calm  Data Reviewed: I  have personally reviewed following labs and imaging studies  CBC: Recent Labs  Lab 12/30/23 0053 12/31/23 0714 01/01/24 0414 01/01/24 2017 01/02/24 0509 01/03/24 0510 01/04/24 1055  WBC 6.4 11.5* 10.6*  --  9.9 9.2 13.7*  NEUTROABS 4.3  --  9.2*  --  8.6* 8.0* 11.4*  HGB 11.5* 7.0* 6.2* 8.6* 7.9* 7.9* 9.2*  HCT 35.8* 21.0* 18.6* 25.8* 24.1* 22.9* 28.0*  MCV 103.2* 102.4* 101.6*  --  95.6 91.6 97.9  PLT 153 110* 98*  --  100* 112* 182   Basic Metabolic Panel: Recent Labs  Lab 12/30/23 0750 12/31/23 0714 01/01/24 0414 01/02/24 0509 01/03/24 0510 01/04/24 1055  NA  --  137 137 138 137 135  K  --  5.3* 5.2* 4.9 5.0 4.8  CL  --  100 105 104 105 103  CO2  --  21* 22 21* 22 20*  GLUCOSE  --  155* 147* 136* 140* 119*  BUN  --  55* 59* 63* 60* 56*  CREATININE  --  2.19* 1.92* 1.81* 1.50* 1.54*  CALCIUM   --  7.6* 7.3* 7.8* 7.7* 7.7*  MG 2.0  --  2.2 2.3 2.5* 2.5*  PHOS  --   --  5.6* 4.6 4.2 3.5   GFR: Estimated Creatinine Clearance: 37.3 mL/min (A) (by C-G formula based on SCr of 1.54 mg/dL (H)). Liver Function Tests: Recent Labs  Lab 12/30/23 0053 01/01/24 0414 01/02/24 0509 01/03/24 0510 01/04/24 1055  AST 23 23 22 17 21   ALT 17 12 13 12 16   ALKPHOS 74 39 39 37* 44  BILITOT 0.4 0.7 0.8 0.8 0.8  PROT 5.8* 4.7* 5.3* 4.8* 5.3*  ALBUMIN  2.6* 2.2* 2.5* 2.2* 2.6*   No results for input(s): LIPASE, AMYLASE in the last 168 hours. No results for input(s): AMMONIA in the last 168 hours. Coagulation Profile: Recent Labs  Lab 12/30/23 0750  INR 1.1   Cardiac Enzymes: No results for input(s): CKTOTAL, CKMB, CKMBINDEX, TROPONINI in the last 168 hours. BNP (last 3 results) No results for input(s): PROBNP in the last 8760 hours. HbA1C: No results for input(s): HGBA1C in the last 72 hours. CBG: No results for input(s): GLUCAP in the last 168 hours. Lipid Profile: No results for input(s): CHOL, HDL, LDLCALC, TRIG, CHOLHDL, LDLDIRECT in  the last 72 hours. Thyroid  Function Tests: No results for input(s): TSH, T4TOTAL, FREET4, T3FREE, THYROIDAB in the last 72 hours. Anemia Panel: Recent Labs    01/02/24 0509 01/03/24 0510  FERRITIN 68 52   Sepsis Labs: No results for input(s): PROCALCITON, LATICACIDVEN in the last 168 hours.  Recent Results (from the past 240 hours)  Resp panel by RT-PCR (  RSV, Flu A&B, Covid) Anterior Nasal Swab     Status: Abnormal   Collection Time: 12/30/23  2:02 AM   Specimen: Anterior Nasal Swab  Result Value Ref Range Status   SARS Coronavirus 2 by RT PCR POSITIVE (A) NEGATIVE Final   Influenza A by PCR NEGATIVE NEGATIVE Final   Influenza B by PCR NEGATIVE NEGATIVE Final    Comment: (NOTE) The Xpert Xpress SARS-CoV-2/FLU/RSV plus assay is intended as an aid in the diagnosis of influenza from Nasopharyngeal swab specimens and should not be used as a sole basis for treatment. Nasal washings and aspirates are unacceptable for Xpert Xpress SARS-CoV-2/FLU/RSV testing.  Fact Sheet for Patients: bloggercourse.com  Fact Sheet for Healthcare Providers: seriousbroker.it  This test is not yet approved or cleared by the [place]  FDA and has been authorized for detection and/or diagnosis of SARS-CoV-2 by FDA under an Emergency Use Authorization (EUA). This EUA will remain in effect (meaning this test can be used) for the duration of the COVID-19 declaration under Section 564(b)(1) of the Act, 21 U.S.C. section 360bbb-3(b)(1), unless the authorization is terminated or revoked.     Resp Syncytial Virus by PCR NEGATIVE NEGATIVE Final    Comment: (NOTE) Fact Sheet for Patients: bloggercourse.com  Fact Sheet for Healthcare Providers: seriousbroker.it  This test is not yet approved or cleared by the [place]  FDA and has been authorized for detection and/or diagnosis of  SARS-CoV-2 by FDA under an Emergency Use Authorization (EUA). This EUA will remain in effect (meaning this test can be used) for the duration of the COVID-19 declaration under Section 564(b)(1) of the Act, 21 U.S.C. section 360bbb-3(b)(1), unless the authorization is terminated or revoked.  Performed at [facility], [address] [place], [place] [address]   Surgical pcr screen     Status: None   Collection Time: 12/30/23 11:02 AM   Specimen: Nasal Mucosa; Nasal Swab  Result Value Ref Range Status   MRSA, PCR NEGATIVE NEGATIVE Final   Staphylococcus aureus NEGATIVE NEGATIVE Final    Comment: (NOTE) The Xpert SA Assay (FDA approved for NASAL specimens in patients 14 years of age and older), is one component of a comprehensive surveillance program. It is not intended to diagnose infection nor to guide or monitor treatment. Performed at [facility], [address] [place], [place] [address]     Radiology Studies: DG CHEST PORT 1 VIEW Result Date: 01/04/2024 EXAM: 1 VIEW(S) XRAY OF THE CHEST 01/04/2024 09:07:00 AM COMPARISON: 01/03/2024 CLINICAL HISTORY: [id number] COVID [id number] FINDINGS: LUNGS AND PLEURA: A new 7 mm nodular density is projecting over the right lower lung, which is indeterminate. As this was not seen across multiple recent prior chest radiographs, it is of doubtful clinical significance but could be further evaluated with a dedicated upright PA and lateral chest radiograph. No pleural effusion. No pneumothorax. HEART AND MEDIASTINUM: Atherosclerotic aortic calcifications. No acute abnormality of the cardiac and mediastinal silhouettes. BONES AND SOFT TISSUES: No acute osseous abnormality. IMPRESSION: 1. New 7 mm nodular density projecting over the right lower lung, indeterminate; recommend dedicated upright PA and lateral chest radiograph for further evaluation. Electronically signed by: Waddell Calk MD 01/04/2024 12:09 PM EST RP Workstation: [id number]   DG  CHEST PORT 1 VIEW Result Date: 01/03/2024 CLINICAL DATA:  COVID. EXAM: PORTABLE CHEST 1 VIEW COMPARISON:  01/02/2024. FINDINGS: The heart size and mediastinal contours are unchanged. Aortic atherosclerosis. Emphysema. No focal consolidation, pleural effusion, or pneumothorax. No acute osseous abnormality. IMPRESSION: 1.  No acute cardiopulmonary findings. 2. Emphysema. Electronically Signed   By: Harrietta Sherry M.D.   On: 01/03/2024 11:38   Scheduled Meds:  sodium chloride    Intravenous Once   aspirin  EC  81 mg Oral BID   cholecalciferol  2,000 Units Oral Daily   dexamethasone  (DECADRON ) injection  6 mg Intravenous Daily   divalproex   250 mg Oral BID   feeding supplement  237 mL Oral BID BM   guaiFENesin   1,200 mg Oral BID   memantine   10 mg Oral BID   methocarbamol   500-1,000 mg Oral QID   multivitamin with minerals  1 tablet Oral Q breakfast   pantoprazole   40 mg Oral QAC breakfast   polyethylene glycol  17 g Oral BID   pravastatin   40 mg Oral QHS   senna-docusate  1 tablet Oral BID   sertraline  25 mg Oral Daily   sodium chloride  flush  3 mL Intravenous Q12H   Vitamin D  (Ergocalciferol )  50,000 Units Oral Q7 days   Continuous Infusions:   LOS: 5 days   Danny Marker, DO [facility] Available via Epic secure chat 7am-7pm After these hours, please refer to coverage provider listed on [email or web address] 01/04/2024, 2:20 PM

## 2024-01-04 NOTE — Plan of Care (Signed)
  Problem: Education: Goal: Knowledge of risk factors and measures for prevention of condition will improve Outcome: Progressing   Problem: Education: Goal: Knowledge of General Education information will improve Description: Including pain rating scale, medication(s)/side effects and non-pharmacologic comfort measures Outcome: Progressing   Problem: Activity: Goal: Risk for activity intolerance will decrease Outcome: Progressing   Problem: Elimination: Goal: Will not experience complications related to bowel motility Outcome: Progressing

## 2024-01-05 DIAGNOSIS — U071 COVID-19: Secondary | ICD-10-CM | POA: Diagnosis not present

## 2024-01-05 DIAGNOSIS — S72001A Fracture of unspecified part of neck of right femur, initial encounter for closed fracture: Secondary | ICD-10-CM | POA: Diagnosis not present

## 2024-01-05 LAB — CBC WITH DIFFERENTIAL/PLATELET
Abs Immature Granulocytes: 0.23 K/uL — ABNORMAL HIGH (ref 0.00–0.07)
Basophils Absolute: 0 K/uL (ref 0.0–0.1)
Basophils Relative: 0 %
Eosinophils Absolute: 0 K/uL (ref 0.0–0.5)
Eosinophils Relative: 0 %
HCT: 27.5 % — ABNORMAL LOW (ref 39.0–52.0)
Hemoglobin: 8.9 g/dL — ABNORMAL LOW (ref 13.0–17.0)
Immature Granulocytes: 2 %
Lymphocytes Relative: 12 %
Lymphs Abs: 1.6 K/uL (ref 0.7–4.0)
MCH: 31.3 pg (ref 26.0–34.0)
MCHC: 32.4 g/dL (ref 30.0–36.0)
MCV: 96.8 fL (ref 80.0–100.0)
Monocytes Absolute: 1 K/uL (ref 0.1–1.0)
Monocytes Relative: 7 %
Neutro Abs: 10.6 K/uL — ABNORMAL HIGH (ref 1.7–7.7)
Neutrophils Relative %: 79 %
Platelets: 213 K/uL (ref 150–400)
RBC: 2.84 MIL/uL — ABNORMAL LOW (ref 4.22–5.81)
RDW: 15.6 % — ABNORMAL HIGH (ref 11.5–15.5)
WBC: 13.4 K/uL — ABNORMAL HIGH (ref 4.0–10.5)
nRBC: 0.5 % — ABNORMAL HIGH (ref 0.0–0.2)

## 2024-01-05 LAB — COMPREHENSIVE METABOLIC PANEL WITH GFR
ALT: 16 U/L (ref 0–44)
AST: 22 U/L (ref 15–41)
Albumin: 2.7 g/dL — ABNORMAL LOW (ref 3.5–5.0)
Alkaline Phosphatase: 46 U/L (ref 38–126)
Anion gap: 13 (ref 5–15)
BUN: 56 mg/dL — ABNORMAL HIGH (ref 8–23)
CO2: 20 mmol/L — ABNORMAL LOW (ref 22–32)
Calcium: 7.9 mg/dL — ABNORMAL LOW (ref 8.9–10.3)
Chloride: 100 mmol/L (ref 98–111)
Creatinine, Ser: 1.54 mg/dL — ABNORMAL HIGH (ref 0.61–1.24)
GFR, Estimated: 44 mL/min — ABNORMAL LOW (ref >60)
Glucose, Bld: 116 mg/dL — ABNORMAL HIGH (ref 70–99)
Potassium: 4.9 mmol/L (ref 3.5–5.1)
Sodium: 133 mmol/L — ABNORMAL LOW (ref 135–145)
Total Bilirubin: 0.9 mg/dL (ref 0.0–1.2)
Total Protein: 5.4 g/dL — ABNORMAL LOW (ref 6.5–8.1)

## 2024-01-05 LAB — MAGNESIUM: Magnesium: 2.5 mg/dL — ABNORMAL HIGH (ref 1.7–2.4)

## 2024-01-05 LAB — PHOSPHORUS: Phosphorus: 3.3 mg/dL (ref 2.5–4.6)

## 2024-01-05 MED ORDER — ASPIRIN 81 MG PO TBEC: 81.0000 mg | DELAYED_RELEASE_TABLET | Freq: Two times a day (BID) | ORAL | Status: AC

## 2024-01-05 MED ORDER — FUROSEMIDE 40 MG PO TABS
40.0000 mg | ORAL_TABLET | Freq: Every day | ORAL | Status: DC
  Administered 2024-01-05: 40 mg via ORAL
  Filled 2024-01-05: qty 1

## 2024-01-05 MED ORDER — LOPERAMIDE HCL 2 MG PO CAPS: 2.0000 mg | ORAL_CAPSULE | ORAL | Status: AC | PRN

## 2024-01-05 MED ORDER — GUAIFENESIN ER 600 MG PO TB12: 600.0000 mg | ORAL_TABLET | Freq: Two times a day (BID) | ORAL | Status: AC

## 2024-01-05 MED ORDER — VITAMIN D (ERGOCALCIFEROL) 1.25 MG (50000 UNIT) PO CAPS: 50000.0000 [IU] | ORAL_CAPSULE | ORAL | Status: AC

## 2024-01-05 MED ORDER — VITAMIN D3 25 MCG PO TABS: 2000.0000 [IU] | ORAL_TABLET | Freq: Every day | ORAL | Status: AC

## 2024-01-05 MED ORDER — DEXAMETHASONE 6 MG PO TABS: 6.0000 mg | ORAL_TABLET | Freq: Every day | ORAL | Status: AC

## 2024-01-05 MED ORDER — SENNOSIDES-DOCUSATE SODIUM 8.6-50 MG PO TABS: 1.0000 | ORAL_TABLET | Freq: Two times a day (BID) | ORAL | Status: AC

## 2024-01-05 MED ORDER — DEXAMETHASONE 4 MG PO TABS
6.0000 mg | ORAL_TABLET | Freq: Every day | ORAL | Status: DC
  Administered 2024-01-05: 6 mg via ORAL
  Filled 2024-01-05: qty 2

## 2024-01-05 MED ORDER — POLYETHYLENE GLYCOL 3350 17 G PO PACK: 17.0000 g | PACK | Freq: Two times a day (BID) | ORAL | 0 refills | Status: AC

## 2024-01-05 MED ORDER — ENSURE PLUS HIGH PROTEIN PO LIQD: 237.0000 mL | Freq: Two times a day (BID) | ORAL | Status: AC

## 2024-01-05 MED ORDER — ACETAMINOPHEN 325 MG PO TABS: 650.0000 mg | ORAL_TABLET | Freq: Four times a day (QID) | ORAL | Status: AC | PRN

## 2024-01-05 MED ORDER — BISACODYL 10 MG RE SUPP
10.0000 mg | Freq: Once | RECTAL | Status: AC
  Administered 2024-01-05: 10 mg via RECTAL
  Filled 2024-01-05: qty 1

## 2024-01-05 MED ORDER — HYDROCODONE-ACETAMINOPHEN 5-325 MG PO TABS: 1.0000 | ORAL_TABLET | Freq: Four times a day (QID) | ORAL | 0 refills | Status: AC | PRN

## 2024-01-05 NOTE — Discharge Summary (Signed)
 Physician Discharge Summary   Patient: [name] MRN: [id number] DOB: 09-Oct-1940  Admit date:     12/30/2023  Discharge date: 01/05/24  Discharge Physician: Alejandro Marker, DO   PCP: Charlott Dorn LABOR, MD   Recommendations at discharge:   Follow-up with PCP within 1 to 2 weeks repeat CBC, CMP, mag, Phos within 1 week Follow-up with Orthopedic Surgery within 1 to 2 weeks Follow-up with Cardiology within 1 to 2 weeks Repeat CXR in 2 to 3 weeks  Discharge Diagnoses: Principal Problem:   Closed right hip fracture (HCC)  Resolved Problems:   * No resolved hospital problems. *  Hospital Course: [name] is a 83 y.o. male with medical history significant of CAD, hypothyroidism, hypertension, dyslipidemia, dementia with protein calorie malnutrition and failure to thrive, GERD, previous stage II sacral decubitus, CKD 3 who was sent to the ER after an unwitnessed fall in the bathroom resulting in right-sided hip deformity and pain with shortening and rotation.  Also was Positive for COVID and was symptomatic on 11/14. Orthopedic Surgery consulted and took the patient to the OR for Closed Reduction and Intramedullary Fixation of the Right Intertrochanteric Fx on 11/15. He is steadily improving and had to be transfused pRBCs but appears stable.  Can discharge to SNF but likely needs quarantine period and SNF can take the patient 11/25.    Assessment and Plan:  Suspected Mechanical Unwitnessed Fall with Acute comminuted fracture of Right Femur: Orthopedic Surgery Consulted and took patient for Operative Intervention on 11/15. VTE Prophylaxis per Orthopedic Surgery and now on ASA 81 mg po BID. PT/OT to evaluate and recommending SNF. Pain Control and Bowel Regimen adjusted. IVF as below now stopped. Appears to have improved and appears stable to D/C to SNF; Vitamin D ,25-Hydroxy was 11.33. Start po Cholecalciferol  2000 international units Daily   Possible Syncopal Episode: Continue  Telemetry Monitoring;  PT/OT are recommending SNF; Check Orthostatic VS in the AM. IVF w/ NS @ 75 mL/hr now stopped. Checked a Transthoracic Echocardiogram showed an EF of 40 to 45% with no regional wall motion abnormalities and grade 1 diastolic dysfunction with right ventricular systolic function being normal.    COVID-19 Infection: Patient was actually diagnosed days prior to admission. Fevers have defervesced. He was on Paxlovid prior to admission which is not continued. -Supportive care and C/w Dexamethasone  6 mg Daily x 10 days. Inflammatory Marker Trend improving  Recent Labs    01/02/24 0509 01/03/24 0510  DDIMER 4.08* 2.77*  FERRITIN 68 52  LDH 177 166  CRP 1.1* 0.7  Fibrinogen  improving and is now 150; ESR is now 1 Lab Results  Component Value Date   SARSCOV2NAA POSITIVE (A) 12/30/2023   SARSCOV2NAA NEGATIVE 09/14/2021   SARSCOV2NAA NEGATIVE 07/08/2020   SARSCOV2NAA NEGATIVE 07/06/2020  -Add Guaifenesin  1200 mg po BID will continue Albuterol  2.5 mg neb every 4 as needed for wheezing or shortness of breath -SNF and per TOC will need a 10-day quarantine prior to being accepted and cannot discharge until 11/25  Acute Respiratory Failure w/ Hypoxia in the setting of Above: Patient continued to be a little bit hypoxic and on ambulation did drop.  Now off on 2 L of supplemental oxygen and will continue flutter valve and incentive spirometer.  Repeat chest x-ray this AM showed New 7 mm nodular density projecting over the right lower lung, indeterminate; recommend dedicated upright PA and lateral chest radiograph for further evaluation. Repeat CXR in the AM   AKI on CKD Stage  3a / Elevated AG Metabolic Acidosis: B/L Cr is ~1.3. BUN/Cr Trend: Recent Labs  Lab 12/30/23 0053 12/31/23 0714 01/01/24 0414 01/02/24 0509 01/03/24 0510 01/04/24 1055  BUN 35* 55* 59* 63* 60* 56*  CREATININE 1.52* 2.19* 1.92* 1.81* 1.50* 1.54*  - IV fluid hydration is now stopped. Elevated AG us  improved  but still a slight MA as CO2 is20, anion gap of 12, chloride level 103. Avoid Nephrotoxic Medications, Contrast Dyes, Hypotension and Dehydration to Ensure Adequate Renal Perfusion and will need to Renally Adjust Meds. CTM and Trend Renal Function carefully and repeat CMP in the AM   CAD/Essential Hypertension/Dyslipidemia: Continue Pravastatin  40 mg po at bedtime. Diuretics on hold  Hyperkalemia: K+ is now gone from 5.2 -> 4.9 -> 5.0 -> 4.8. S/p 2 doses of 10 grams of Lokelma  x1. C/w IVF. CTM and Trend and repeat CMP in the AM  Leukocytosis: Had resolved but now worsening again. In the setting of Surgery and steroid Demargination (on daily dexamethasone ). WBC went from 6.4 -> 11.5 -> 10.6 -> 9.9 -> 9.2 -> 13.7. Check Inflammatory Markers for COVID. CTM for S/Sx of Infection. CXR showed no acute cardiopulmonary disease. Repeat CBC in the AM  ABLA as a postoperative Cause superimposed on Macrocytic Anemia: Hgb/Hct went from 11.5/35.8 -> 7.0/21.0 -> 6.2/18.6  w/ MCV of 101.6 and is now gone to 8.6/25.8 -> 7.9/24.1 -> 7.9/22.9 -> 9.2/28.0 w/ MCV of 97.9. Checked Anemia Panel and showed an iron  level of 18, UIBC of 192, TIBC of 210, saturation ratios of 9%, ferritin of 78, folate level greater than 20 and a vitamin B12 level of 566.  With drop in hemoglobin we will Type and Screen and transfuse 1 unit PRBCs CTM For S/Sx of Bleeding; No overt bleeding noted. Repeat CBC in the AM  Thrombocytopenia: Resolved. Plt Count went from 153 -> 110 -> 98 -> 100 -> 112 -> 182. CTM for S/Sx of Bleeding; No overt bleeding noted. Repeat CBC in the AM   Dementia: Pleasant @ baseline. C/w Divalproex  250 mg po BID, Memantine  10 mg po BID, and Sertraline  25 mg po Daily; C/w Delirium Precautions  GERD/GI Prophylaxis: C/w PPI with Pantoprazole  40 mg po daily   Hypoalbuminemia: Patient's Albumin  Lvl was 2.6 -> 2.2 -> 2.5 -> 2.2 -> 2.6. CTM and Trend and repeat CMP in the AM  Protein Calorie Malnutrition/Failure to Thrive:  Nutrition Consultation and now on Ensure Plus High Protein 237 mL po BID  Hx stage 2 Decubitus Ulcer:  Wound 12/30/23 0948 Pressure Injury Sacrum Medial;Upper Stage 1 -  Intact skin with non-blanchable redness of a localized area usually over a bony prominence. (Active)   Nutrition Documentation    Flowsheet Row ED to Hosp-Admission (Current) from 12/30/2023 in [facility]  Nutrition Problem Increased nutrient needs  Etiology post-op healing, wound healing  Nutrition Goal Patient will meet greater than or equal to 90% of their needs  Interventions Ensure Enlive (each supplement provides 350kcal and 20 grams of protein), MVI   Active Pressure Injury/Wound(s)     None          Consultants: Orthopedic Surgery Procedures performed: As delineated as above   Disposition: Skilled nursing facility Diet recommendation:  Discharge Diet Orders (From admission, onward)     Start     Ordered   01/05/24 0000  Diet - low sodium heart healthy        01/05/24 1338  Cardiac diet DISCHARGE MEDICATION: Allergies as of 01/05/2024       Reactions   Alfuzosin Other (See Comments)   Leg weakness   Lisinopril Cough   Lorazepam  Other (See Comments)   i don't like it  Prefers Xanax  or valium    Tamsulosin Hcl Other (See Comments)   Leg weakness   Uroxatral [alfuzosin Hcl Er] Other (See Comments)   Leg weakness        Medication List     TAKE these medications    acetaminophen  325 MG tablet Commonly known as: TYLENOL  Take 2 tablets (650 mg total) by mouth every 6 (six) hours as needed for mild pain (pain score 1-3) (or Fever >/= 101).   aspirin  EC 81 MG tablet Take 1 tablet (81 mg total) by mouth 2 (two) times daily. Swallow whole.   bismuth  subsalicylate 262 MG/15ML suspension Commonly known as: PEPTO BISMOL Take 30 mLs by mouth every 6 (six) hours as needed for indigestion.   dexamethasone  6 MG tablet Commonly known as:  DECADRON  Take 1 tablet (6 mg total) by mouth daily.   divalproex  125 MG DR tablet Commonly known as: DEPAKOTE  TAKE TWO TABLETS BY MOUTH TWICE DAILY   feeding supplement Liqd Take 237 mLs by mouth 2 (two) times daily between meals.   guaiFENesin  600 MG 12 hr tablet Commonly known as: MUCINEX  Take 1 tablet (600 mg total) by mouth 2 (two) times daily for 5 days.   HYDROcodone -acetaminophen  5-325 MG tablet Commonly known as: NORCO/VICODIN Take 1-2 tablets by mouth every 6 (six) hours as needed for severe pain (pain score 7-10).   loperamide  2 MG capsule Commonly known as: IMODIUM  Take 1 capsule (2 mg total) by mouth as needed for diarrhea or loose stools. What changed:  when to take this reasons to take this   memantine  10 MG tablet Commonly known as: NAMENDA  TAKE ONE TABLET BY MOUTH TWICE DAILY   Multi-Vitamins Tabs Take 1 tablet by mouth daily with breakfast.   pantoprazole  40 MG tablet Commonly known as: PROTONIX  Take 40 mg by mouth daily before breakfast.   polyethylene glycol 17 g packet Commonly known as: MIRALAX  / GLYCOLAX  Take 17 g by mouth 2 (two) times daily. What changed:  when to take this reasons to take this   pravastatin  40 MG tablet Commonly known as: PRAVACHOL  Take 40 mg by mouth at bedtime.   senna-docusate 8.6-50 MG tablet Commonly known as: Senokot-S Take 1 tablet by mouth 2 (two) times daily.   sertraline  25 MG tablet Commonly known as: ZOLOFT  Take 25 mg by mouth daily.   spironolactone  25 MG tablet Commonly known as: ALDACTONE  TAKE 1/2 TABLET BY MOUTH DAILY *PATIENT NEEDS APPOINTMENT FOR FURTHER REFILLS*   telmisartan  20 MG tablet Commonly known as: MICARDIS  Take 20 mg by mouth daily.   torsemide  10 MG tablet Commonly known as: DEMADEX  TAKE 1 TABLET BY MOUTH DAILY *NEED APPOINTMENT* What changed: See the new instructions.   Vitamin D  (Ergocalciferol ) 1.25 MG (50000 UNIT) Caps capsule Commonly known as: DRISDOL  Take 1 capsule  (50,000 Units total) by mouth every 7 (seven) days for 5 doses. Start taking on: January 10, 2024   vitamin D3 25 MCG tablet Commonly known as: CHOLECALCIFEROL  Take 2 tablets (2,000 Units total) by mouth daily. Start taking on: January 06, 2024               Discharge Care Instructions  (From admission, onward)           Start  Ordered   01/05/24 0000  Discharge wound care:       Comments: Per Ortho Recommendations   01/05/24 1338            Contact information for after-discharge care     Destination     [facility] .   Service: Skilled Nursing Contact information: [address] [place]  [address] [phone]                    Discharge Exam: Fredricka Weights   12/30/23 1116  Weight: 73.9 kg   Vitals:   01/05/24 0421 01/05/24 0733  BP: (!) 127/58 (!) 123/53  Pulse: 68 76  Resp: 15 16  Temp: 98 F (36.7 C) 98.4 F (36.9 C)  SpO2: 100%    Examination: Physical Exam:  Constitutional: Frail-appearing elderly Caucasian male in no acute distress appears calm Respiratory: Slight diminished to auscultation bilaterally with some coarse breath sounds and has some slight crackle but no appreciable wheezing or rales.  Rhonchi is improved.  Has a normal respiratory effort and has unlabored breathing and not wearing any supplemental oxygen via nasal cannula Cardiovascular: RRR, no murmurs / rubs / gallops. S1 and S2 auscultated.  Mild 1+ upper and lower extremity edema Abdomen: Soft, non-tender, non-distended. Bowel sounds positive.  GU: Deferred. Musculoskeletal: No clubbing / cyanosis of digits/nails. No joint deformity upper and lower extremities.  Skin: No rashes, lesions, ulcers on a limited skin evaluation. No induration; Warm and dry.  Neurologic: CN 2-12 grossly intact with no focal deficits. Romberg sign and cerebellar reflexes not assessed.  Psychiatric: Appears awake and alert and is  calm  Condition at discharge: stable  The results of significant diagnostics from this hospitalization (including imaging, microbiology, ancillary and laboratory) are listed below for reference.   Imaging Studies: DG CHEST PORT 1 VIEW Result Date: 01/04/2024 EXAM: 1 VIEW(S) XRAY OF THE CHEST 01/04/2024 09:07:00 AM COMPARISON: 01/03/2024 CLINICAL HISTORY: [id number] COVID [id number] FINDINGS: LUNGS AND PLEURA: A new 7 mm nodular density is projecting over the right lower lung, which is indeterminate. As this was not seen across multiple recent prior chest radiographs, it is of doubtful clinical significance but could be further evaluated with a dedicated upright PA and lateral chest radiograph. No pleural effusion. No pneumothorax. HEART AND MEDIASTINUM: Atherosclerotic aortic calcifications. No acute abnormality of the cardiac and mediastinal silhouettes. BONES AND SOFT TISSUES: No acute osseous abnormality. IMPRESSION: 1. New 7 mm nodular density projecting over the right lower lung, indeterminate; recommend dedicated upright PA and lateral chest radiograph for further evaluation. Electronically signed by: Waddell Calk MD 01/04/2024 12:09 PM EST RP Workstation: [id number]   DG CHEST PORT 1 VIEW Result Date: 01/03/2024 CLINICAL DATA:  COVID. EXAM: PORTABLE CHEST 1 VIEW COMPARISON:  01/02/2024. FINDINGS: The heart size and mediastinal contours are unchanged. Aortic atherosclerosis. Emphysema. No focal consolidation, pleural effusion, or pneumothorax. No acute osseous abnormality. IMPRESSION: 1. No acute cardiopulmonary findings. 2. Emphysema. Electronically Signed   By: Harrietta Sherry M.D.   On: 01/03/2024 11:38   DG CHEST PORT 1 VIEW Result Date: 01/02/2024 EXAM: 1 VIEW(S) XRAY OF THE CHEST 01/02/2024 06:14:31 AM COMPARISON: 01/01/2024 CLINICAL HISTORY: COVID FINDINGS: LUNGS AND PLEURA: Emphysema. No pleural effusion. No pneumothorax. HEART AND MEDIASTINUM: Aortic atherosclerosis. No acute abnormality  of the cardiac silhouette. BONES AND SOFT TISSUES: No acute osseous abnormality. IMPRESSION: 1. No acute cardiopulmonary process. 2. Aortic atherosclerosis. 3. Emphysema. Electronically signed by: Ryan Salvage MD 01/02/2024 11:51  AM EST RP Workstation: [id number]   ECHOCARDIOGRAM COMPLETE Result Date: 01/01/2024    ECHOCARDIOGRAM REPORT   Patient Name:   LONZELL DORRIS Date of Exam: 01/01/2024 Medical Rec #:  [id number]       Height:       72.0 in Accession #:    [id number]      Weight:       163.0 lb Date of Birth:  29-Jan-1941      BSA:          1.953 m Patient Age:    83 years        BP:           121/61 mmHg Patient Gender: M               HR:           63 bpm. Exam Location:  Inpatient Procedure: 2D Echo, Cardiac Doppler and Color Doppler (Both Spectral and Color            Flow Doppler were utilized during procedure). Indications:    Syncope R55  History:        Patient has prior history of Echocardiogram examinations, most                 recent 09/19/2022. CAD; Risk Factors:Hypertension.  Sonographer:    Jayson Gaskins Referring Phys: [id number] Akiel A NETTEY IMPRESSIONS  1. Left ventricular ejection fraction, by estimation, is 45 to 50%. The left ventricle has mildly decreased function. The left ventricle has no regional wall motion abnormalities. Left ventricular diastolic parameters are consistent with Grade I diastolic dysfunction (impaired relaxation).  2. Right ventricular systolic function is normal. The right ventricular size is normal.  3. The mitral valve is normal in structure. No evidence of mitral valve regurgitation. No evidence of mitral stenosis.  4. The aortic valve is normal in structure. Aortic valve regurgitation is not visualized. No aortic stenosis is present.  5. The inferior vena cava is normal in size with greater than 50% respiratory variability, suggesting right atrial pressure of 3 mmHg. Comparison(s): No significant change from prior study. FINDINGS  Left Ventricle: Left  ventricular ejection fraction, by estimation, is 45 to 50%. The left ventricle has mildly decreased function. The left ventricle has no regional wall motion abnormalities. The left ventricular internal cavity size was normal in size. There is no left ventricular hypertrophy. Left ventricular diastolic parameters are consistent with Grade I diastolic dysfunction (impaired relaxation). Right Ventricle: The right ventricular size is normal. No increase in right ventricular wall thickness. Right ventricular systolic function is normal. Left Atrium: Left atrial size was normal in size. Right Atrium: Right atrial size was normal in size. Pericardium: There is no evidence of pericardial effusion. Mitral Valve: The mitral valve is normal in structure. No evidence of mitral valve regurgitation. No evidence of mitral valve stenosis. Tricuspid Valve: The tricuspid valve is normal in structure. Tricuspid valve regurgitation is not demonstrated. No evidence of tricuspid stenosis. Aortic Valve: The aortic valve is normal in structure. Aortic valve regurgitation is not visualized. No aortic stenosis is present. Aortic valve mean gradient measures 2.0 mmHg. Aortic valve peak gradient measures 2.8 mmHg. Aortic valve area, by VTI measures 3.52 cm. Pulmonic Valve: The pulmonic valve was normal in structure. Pulmonic valve regurgitation is not visualized. No evidence of pulmonic stenosis. Aorta: The aortic root is normal in size and structure. Venous: The inferior vena cava is normal in size with greater than 50% respiratory variability,  suggesting right atrial pressure of 3 mmHg. IAS/Shunts: No atrial level shunt detected by color flow Doppler.  LEFT VENTRICLE PLAX 2D LVIDd:         4.20 cm   Diastology LVIDs:         3.20 cm   LV e' medial:    6.42 cm/s LV PW:         0.80 cm   LV E/e' medial:  11.4 LV IVS:        0.90 cm   LV e' lateral:   3.81 cm/s LVOT diam:     2.00 cm   LV E/e' lateral: 19.3 LV SV:         62 LV SV Index:   32  LVOT Area:     3.14 cm  LEFT ATRIUM           Index LA Vol (A4C): 17.0 ml 8.70 ml/m  AORTIC VALVE AV Area (Vmax):    3.20 cm AV Area (Vmean):   3.31 cm AV Area (VTI):     3.52 cm AV Vmax:           83.10 cm/s AV Vmean:          58.100 cm/s AV VTI:            0.175 m AV Peak Grad:      2.8 mmHg AV Mean Grad:      2.0 mmHg LVOT Vmax:         84.60 cm/s LVOT Vmean:        61.300 cm/s LVOT VTI:          0.196 m LVOT/AV VTI ratio: 1.12  AORTA Ao Root diam: 3.00 cm MITRAL VALVE MV Area (PHT): 3.19 cm    SHUNTS MV Decel Time: 238 msec    Systemic VTI:  0.20 m MV E velocity: 73.40 cm/s  Systemic Diam: 2.00 cm MV A velocity: 99.00 cm/s MV E/A ratio:  0.74 Morene Brownie Electronically signed by Morene Brownie Signature Date/Time: 01/01/2024/1:22:51 PM    Final    DG CHEST PORT 1 VIEW Result Date: 01/01/2024 EXAM: 1 VIEW(S) XRAY OF THE CHEST 01/01/2024 05:59:34 AM COMPARISON: None available. CLINICAL HISTORY: [id number] COVID [id number] COVID FINDINGS: LUNGS AND PLEURA: Lungs appear hyperinflated with chronic interstitial changes compatible with emphysema. Diffuse bronchial wall thickening. No pleural effusion. No pneumothorax. HEART AND MEDIASTINUM: Heart size normal. Aortic atherosclerosis. BONES AND SOFT TISSUES: No acute osseous abnormality. IMPRESSION: 1. Hyperinflated lungs with chronic interstitial changes compatible with emphysema and diffuse bronchial wall thickening. Electronically signed by: Waddell Calk MD 01/01/2024 06:15 AM EST RP Workstation: [id number]   DG HIP UNILAT WITH PELVIS 2-3 VIEWS RIGHT Result Date: 12/30/2023 CLINICAL DATA:  [id number] Surgery, elective [id number] EXAM: DG HIP (WITH OR WITHOUT PELVIS) 2-3V RIGHT COMPARISON:  December 30, 2022 FINDINGS: Spot fluoroscopy images were obtained for surgical planning purposes. This demonstrates placement of an intramedullary rod with improved alignment of an inter trochanteric fracture. Osteopenia. Time: 175 seconds Dose: 49.99 mGy Please reference  procedure report for further details. IMPRESSION: Spot fluoroscopy images obtained for surgical planning purposes. Electronically Signed   By: Corean Salter M.D.   On: 12/30/2023 13:31   DG C-Arm 1-60 Min-No Report Result Date: 12/30/2023 Fluoroscopy was utilized by the requesting physician.  No radiographic interpretation.   DG C-Arm 1-60 Min-No Report Result Date: 12/30/2023 Fluoroscopy was utilized by the requesting physician.  No radiographic interpretation.   CT Cervical Spine Wo Contrast Result Date: 12/30/2023 EXAM:  CT CERVICAL SPINE WITHOUT CONTRAST 12/30/2023 02:10:00 AM TECHNIQUE: CT of the cervical spine was performed without the administration of intravenous contrast. Multiplanar reformatted images are provided for review. Automated exposure control, iterative reconstruction, and/or weight based adjustment of the mA/kV was utilized to reduce the radiation dose to as low as reasonably achievable. COMPARISON: None available. CLINICAL HISTORY: Neck trauma (Age >= 65y). FINDINGS: CERVICAL SPINE: BONES AND ALIGNMENT: No acute fracture or traumatic malalignment. DEGENERATIVE CHANGES: Disc space narrowing and endplate remodeling at C5-C6 in keeping with mild degenerative disc disease. Multilevel uncovertebral and facet arthrosis results in multilevel moderate-to-severe neuroforaminal narrowing, most severe on the right at C5-C6 and on the left at C3-C4. SOFT TISSUES: No prevertebral soft tissue swelling. IMPRESSION: 1. No acute abnormality of the cervical spine related to the reported neck trauma. 2. Mild degenerative disc disease at C5-6 with multilevel uncovertebral and facet arthrosis resulting in multilevel moderate-to-severe neuroforaminal narrowing, most severe on the right at C6 and on the left at C3-4. Electronically signed by: Dorethia Molt MD 12/30/2023 02:23 AM EST RP Workstation: [id number]   CT Head Wo Contrast Result Date: 12/30/2023 EXAM: CT HEAD WITHOUT CONTRAST 12/30/2023  02:10:00 AM TECHNIQUE: CT of the head was performed without the administration of intravenous contrast. Automated exposure control, iterative reconstruction, and/or weight based adjustment of the mA/kV was utilized to reduce the radiation dose to as low as reasonably achievable. COMPARISON: 02/15/2023 CLINICAL HISTORY: Head trauma, minor (Age >= 65y) FINDINGS: BRAIN AND VENTRICLES: No acute hemorrhage. No evidence of acute infarct. No hydrocephalus. No extra-axial collection. No mass effect or midline shift. Global cortical atrophy, cerebellar predominant. Subcortical and periventricular small vessel ischemic changes. ORBITS: No acute abnormality. SINUSES: No acute abnormality. SOFT TISSUES AND SKULL: No acute soft tissue abnormality. No skull fracture. IMPRESSION: 1. No acute intracranial abnormality. Electronically signed by: Pinkie Pebbles MD 12/30/2023 02:19 AM EST RP Workstation: [id number]   DG Hip Unilat W or Wo Pelvis 2-3 Views Right Result Date: 12/30/2023 EXAM: 3 VIEW(S) XRAY OF THE UNILATERAL HIP 12/30/2023 01:30:00 AM COMPARISON: None available. CLINICAL HISTORY: fall FINDINGS: BONES AND JOINTS: Left hip arthroplasty hardware in place. Acute comminuted intertrochanteric fracture of the right femur with associated varus angulation. Mildly displaced lesser trochanteric fragment. SOFT TISSUES: Right lower quadrant ostomy noted. IMPRESSION: 1. Acute comminuted intertrochanteric fracture of the right femur . Electronically signed by: Pinkie Pebbles MD 12/30/2023 01:35 AM EST RP Workstation: [id number]   DG Chest Port 1 View Result Date: 12/30/2023 EXAM: 1 VIEW(S) XRAY OF THE CHEST 12/30/2023 01:30:00 AM COMPARISON: 08/15/2023 CLINICAL HISTORY: fall FINDINGS: LUNGS AND PLEURA: No focal pulmonary opacity. No pleural effusion. No pneumothorax. HEART AND MEDIASTINUM: No acute abnormality of the cardiac and mediastinal silhouettes. BONES AND SOFT TISSUES: No acute osseous abnormality. IMPRESSION: 1. No  acute cardiopulmonary process. Electronically signed by: Pinkie Pebbles MD 12/30/2023 01:34 AM EST RP Workstation: [id number]   Microbiology: Results for orders placed or performed during the hospital encounter of 12/30/23  Resp panel by RT-PCR (RSV, Flu A&B, Covid) Anterior Nasal Swab     Status: Abnormal   Collection Time: 12/30/23  2:02 AM   Specimen: Anterior Nasal Swab  Result Value Ref Range Status   SARS Coronavirus 2 by RT PCR POSITIVE (A) NEGATIVE Final   Influenza A by PCR NEGATIVE NEGATIVE Final   Influenza B by PCR NEGATIVE NEGATIVE Final    Comment: (NOTE) The Xpert Xpress SARS-CoV-2/FLU/RSV plus assay is intended as an aid in the diagnosis of influenza from Nasopharyngeal swab specimens  and should not be used as a sole basis for treatment. Nasal washings and aspirates are unacceptable for Xpert Xpress SARS-CoV-2/FLU/RSV testing.  Fact Sheet for Patients: bloggercourse.com  Fact Sheet for Healthcare Providers: seriousbroker.it  This test is not yet approved or cleared by the [place]  FDA and has been authorized for detection and/or diagnosis of SARS-CoV-2 by FDA under an Emergency Use Authorization (EUA). This EUA will remain in effect (meaning this test can be used) for the duration of the COVID-19 declaration under Section 564(b)(1) of the Act, 21 U.S.C. section 360bbb-3(b)(1), unless the authorization is terminated or revoked.     Resp Syncytial Virus by PCR NEGATIVE NEGATIVE Final    Comment: (NOTE) Fact Sheet for Patients: bloggercourse.com  Fact Sheet for Healthcare Providers: seriousbroker.it  This test is not yet approved or cleared by the [place]  FDA and has been authorized for detection and/or diagnosis of SARS-CoV-2 by FDA under an Emergency Use Authorization (EUA). This EUA will remain in effect (meaning this test can be used) for the  duration of the COVID-19 declaration under Section 564(b)(1) of the Act, 21 U.S.C. section 360bbb-3(b)(1), unless the authorization is terminated or revoked.  Performed at [facility], [address], [place], [place] [address]   Surgical pcr screen     Status: None   Collection Time: 12/30/23 11:02 AM   Specimen: Nasal Mucosa; Nasal Swab  Result Value Ref Range Status   MRSA, PCR NEGATIVE NEGATIVE Final   Staphylococcus aureus NEGATIVE NEGATIVE Final    Comment: (NOTE) The Xpert SA Assay (FDA approved for NASAL specimens in patients 61 years of age and older), is one component of a comprehensive surveillance program. It is not intended to diagnose infection nor to guide or monitor treatment. Performed at [facility], [address], [place], [place] [address]    Labs: CBC: Recent Labs  Lab 01/01/24 0414 01/01/24 2017 01/02/24 0509 01/03/24 0510 01/04/24 1055 01/05/24 0547  WBC 10.6*  --  9.9 9.2 13.7* 13.4*  NEUTROABS 9.2*  --  8.6* 8.0* 11.4* 10.6*  HGB 6.2* 8.6* 7.9* 7.9* 9.2* 8.9*  HCT 18.6* 25.8* 24.1* 22.9* 28.0* 27.5*  MCV 101.6*  --  95.6 91.6 97.9 96.8  PLT 98*  --  100* 112* 182 213   Basic Metabolic Panel: Recent Labs  Lab 01/01/24 0414 01/02/24 0509 01/03/24 0510 01/04/24 1055 01/05/24 0547  NA 137 138 137 135 133*  K 5.2* 4.9 5.0 4.8 4.9  CL 105 104 105 103 100  CO2 22 21* 22 20* 20*  GLUCOSE 147* 136* 140* 119* 116*  BUN 59* 63* 60* 56* 56*  CREATININE 1.92* 1.81* 1.50* 1.54* 1.54*  CALCIUM  7.3* 7.8* 7.7* 7.7* 7.9*  MG 2.2 2.3 2.5* 2.5* 2.5*  PHOS 5.6* 4.6 4.2 3.5 3.3   Liver Function Tests: Recent Labs  Lab 01/01/24 0414 01/02/24 0509 01/03/24 0510 01/04/24 1055 01/05/24 0547  AST 23 22 17 21 22   ALT 12 13 12 16 16   ALKPHOS 39 39 37* 44 46  BILITOT 0.7 0.8 0.8 0.8 0.9  PROT 4.7* 5.3* 4.8* 5.3* 5.4*  ALBUMIN  2.2* 2.5* 2.2* 2.6* 2.7*   CBG: No results for input(s): GLUCAP in the last 168 hours.  Discharge time  spent: greater than 30 minutes.  Signed: Alejandro Marker, DO [facility] 01/05/2024

## 2024-01-05 NOTE — Progress Notes (Signed)
 Physical Therapy Treatment Patient Details Name: [name] MRN: [id number] DOB: 07-26-1940 Today's Date: 01/05/2024   History of Present Illness Patient is an 83 year old male who presented 11/15 s/p fall with intertrochanteric right hip fracture.  Underwent intramedullary fixation 12/30/23. COVID+. PMH significant for dementia, CRF, CAD, DDD, GERD.    PT Comments  Pt with slow mobility progression. He required mod assist bed mobility, +2 mod assist transfers, and +2 mod assist amb 7' with RW. Gait distance limited by pain. SpO2 maintained in 90s on RA. Pt in recliner with feet elevated at end of session. Current POC remains appropriate.     If plan is discharge home, recommend the following: Two people to help with bathing/dressing/bathroom;Two people to help with walking and/or transfers;Help with stairs or ramp for entrance;Assistance with cooking/housework;Assist for transportation;Direct supervision/assist for financial management;Direct supervision/assist for medications management   Can travel by private vehicle     No  Equipment Recommendations  BSC/3in1;Wheelchair (measurements PT);Wheelchair cushion (measurements PT);Hospital bed    Recommendations for Other Services       Precautions / Restrictions Precautions Precautions: Fall;Other (comment) Recall of Precautions/Restrictions: Impaired Precaution/Restrictions Comments: COVID+ Restrictions RLE Weight Bearing Per Provider Order: Weight bearing as tolerated     Mobility  Bed Mobility Overal bed mobility: Needs Assistance Bed Mobility: Supine to Sit     Supine to sit: Mod assist, HOB elevated     General bed mobility comments: assist with trunk and BLE, increased time    Transfers Overall transfer level: Needs assistance Equipment used: Rolling walker (2 wheels) Transfers: Sit to/from Stand Sit to Stand: Mod assist, +2 physical assistance, From elevated surface           General transfer comment:  cues for sequencing, assist to power up and stabilize balance    Ambulation/Gait Ambulation/Gait assistance: Mod assist, +2 safety/equipment, +2 physical assistance Gait Distance (Feet): 7 Feet Assistive device: Rolling walker (2 wheels) Gait Pattern/deviations: Step-to pattern, Step-through pattern, Decreased stride length, Decreased stance time - right, Decreased weight shift to right Gait velocity: decreased Gait velocity interpretation: <1.8 ft/sec, indicate of risk for recurrent falls   General Gait Details: assist to progress RLE through swing phase, assist for balance and RW management, distance limited by pain   Stairs             Wheelchair Mobility     Tilt Bed    Modified Rankin (Stroke Patients Only)       Balance Overall balance assessment: Needs assistance Sitting-balance support: Bilateral upper extremity supported, Feet supported Sitting balance-Leahy Scale: Poor     Standing balance support: Bilateral upper extremity supported, During functional activity, Reliant on assistive device for balance Standing balance-Leahy Scale: Poor                              Communication Communication Communication: Impaired Factors Affecting Communication: Reduced clarity of speech  Cognition Arousal: Alert Behavior During Therapy: Flat affect   PT - Cognitive impairments: History of cognitive impairments, No family/caregiver present to determine baseline                         Following commands: Impaired Following commands impaired: Follows one step commands with increased time, Follows one step commands inconsistently    Cueing Cueing Techniques: Verbal cues, Visual cues, Gestural cues, Tactile cues  Exercises      General Comments  Pertinent Vitals/Pain Pain Assessment Pain Assessment: Faces Faces Pain Scale: Hurts whole lot Pain Location: RLE Pain Descriptors / Indicators: Discomfort, Grimacing, Guarding, Operative  site guarding, Moaning Pain Intervention(s): Monitored during session, Repositioned, Limited activity within patient's tolerance, Patient requesting pain meds-RN notified    Home Living                          Prior Function            PT Goals (current goals can now be found in the care plan section) Acute Rehab PT Goals Patient Stated Goal: not stated Progress towards PT goals: Progressing toward goals    Frequency    Min 2X/week      PT Plan      Co-evaluation   Reason for Co-Treatment: Necessary to address cognition/behavior during functional activity;For patient/therapist safety;To address functional/ADL transfers PT goals addressed during session: Balance;Mobility/safety with mobility;Proper use of DME;Strengthening/ROM OT goals addressed during session: Strengthening/ROM;ADL's and self-care      AM-PAC PT 6 Clicks Mobility   Outcome Measure  Help needed turning from your back to your side while in a flat bed without using bedrails?: A Lot Help needed moving from lying on your back to sitting on the side of a flat bed without using bedrails?: A Lot Help needed moving to and from a bed to a chair (including a wheelchair)?: Total Help needed standing up from a chair using your arms (e.g., wheelchair or bedside chair)?: Total Help needed to walk in hospital room?: Total Help needed climbing 3-5 steps with a railing? : Total 6 Click Score: 8    End of Session Equipment Utilized During Treatment: Gait belt Activity Tolerance: Patient tolerated treatment well;Patient limited by pain Patient left: in chair;with call bell/phone within reach;with chair alarm set Nurse Communication: Mobility status;Patient requests pain meds PT Visit Diagnosis: Unsteadiness on feet (R26.81);Other abnormalities of gait and mobility (R26.89);Repeated falls (R29.6);Muscle weakness (generalized) (M62.81);History of falling (Z91.81);Difficulty in walking, not elsewhere classified  (R26.2);Pain Pain - Right/Left: Right Pain - part of body: Hip     Time: 9251-9187 PT Time Calculation (min) (ACUTE ONLY): 24 min  Charges:    $Therapeutic Activity: 8-22 mins PT General Charges $$ ACUTE PT VISIT: 1 Visit                     Sari MATSU., PT  Office # [phone]    Erven Sari Shaker 01/05/2024, 10:02 AM

## 2024-01-05 NOTE — TOC Transition Note (Signed)
 Transition of Care [facility]) - Discharge Note   Patient Details  Name: [name] MRN: [id number] Date of Birth: 10/09/1940  [facility]) CM/SW Contact:  Bridget Cordella Simmonds, LCSW Phone Number: 01/05/2024, 2:34 PM   Clinical Narrative: Pt discharging to [place], room 104.  RN report to [phone].  PTAR called [phone].  CSW spoke with daughter again, she is just concerned, wants pt to have good rehab.  Would like to speak with someone at [facility], CSW requested [name] to call daughter.      Final next level of care: Skilled Nursing Facility Barriers to Discharge: Barriers Resolved   Patient Goals and CMS Choice     Choice offered to / list presented to : Adult Children (daughter [name] requetsing [employer])      Discharge Placement              Patient chooses bed at: [facility] Patient to be transferred to facility by: ptar Name of family member notified: daughter [name] Patient and family notified of of transfer: 01/05/24  Discharge Plan and Services Additional resources added to the After Visit Summary for   In-house Referral: Clinical Social Work   Post Acute Care Choice: Skilled Nursing Facility                               Social Drivers of Health (SDOH) Interventions SDOH Screenings   Food Insecurity: No Food Insecurity (12/30/2023)  Housing: Low Risk  (12/30/2023)  Transportation Needs: No Transportation Needs (12/30/2023)  Utilities: Not At Risk (12/30/2023)  Social Connections: Socially Isolated (12/30/2023)  Tobacco Use: Medium Risk (02/15/2023)     Readmission Risk Interventions    02/20/2023    2:49 PM  Readmission Risk Prevention Plan  Transportation Screening Complete  PCP or Specialist Appt within 5-7 Days Complete  Home Care Screening Complete  Medication Review (RN CM) Complete

## 2024-01-05 NOTE — Progress Notes (Signed)
 Patient discharged to SNF, report called to [place], [place]. Patient's daughter at the bedside at the time of discharge and confirms she has all of patient's belongings. NO IV present. Patient left the floor with PTAR.

## 2024-01-05 NOTE — TOC Progression Note (Addendum)
 Transition of Care [facility]) - Progression Note    Patient Details  Name: [name] MRN: [id number] Date of Birth: 09-Nov-1940  Transition of Care [facility]) CM/SW Contact  Bridget Cordella Simmonds, LCSW Phone Number: 01/05/2024, 8:21 AM  Clinical Narrative:   CSW received message from [name]: they can receive pt right away and complete his quarantine there.  CSW spoke with daughter [name], she will review [name].  Plans to be at the hospital shortly.   9049: CSW spoke with [name], she is agreeable to accept offer at [place].    SNF auth request submitted in [place] and approved: [id number], 5 days: 11/21-11/25.  MD notified.   1100: TC daughter [name]: after more thought, she is not willing for pt to go to [place].  Team updated.  All other SNF offers are requiring 10 day quarantine.    ICM leadership/Zack notified.  1140: CSW advised by Zack to discuss with daughter option of DC home with [facility] if she is declining SNF.  CSW spoke with daughter, updated her on the above.  She is requesting to speak with MD, does not think pt is stable for DC.  MD informed.   Per MD, pt daughter will accept [name].  [name] informed, still have bed available.   Expected Discharge Plan: Skilled Nursing Facility Barriers to Discharge: Continued Medical Work up, Other (must enter comment) (covid positive)               Expected Discharge Plan and Services In-house Referral: Clinical Social Work   Post Acute Care Choice: Skilled Nursing Facility Living arrangements for the past 2 months: Single Family Home                                       Social Drivers of Health (SDOH) Interventions SDOH Screenings   Food Insecurity: No Food Insecurity (12/30/2023)  Housing: Low Risk  (12/30/2023)  Transportation Needs: No Transportation Needs (12/30/2023)  Utilities: Not At Risk (12/30/2023)  Social Connections: Socially Isolated (12/30/2023)  Tobacco Use: Medium Risk (02/15/2023)     Readmission Risk Interventions    02/20/2023    2:49 PM  Readmission Risk Prevention Plan  Transportation Screening Complete  PCP or Specialist Appt within 5-7 Days Complete  Home Care Screening Complete  Medication Review (RN CM) Complete

## 2024-01-05 NOTE — Progress Notes (Signed)
 Occupational Therapy Treatment Patient Details Name: [name] MRN: [id number] DOB: 03-06-1940 Today's Date: 01/05/2024   History of present illness Patient is an 83 year old male who presented 11/15 s/p fall with intertrochanteric right hip fracture.  Underwent intramedullary fixation 12/30/23. COVID+. PMH significant for dementia, CRF, CAD, DDD, GERD.   OT comments  This 83 yo male seen today in conjunction with PT to see if we could progress mobility. Pt was able to take some steps with us  but needed A to advance his RLE and only small steps. He was able to wash his face while seated in recliner once washcloth presented to him. He will continue to benefit from acute OT with follow up from continued inpatient follow up therapy, <3 hours/day.       If plan is discharge home, recommend the following:  Assistance with cooking/housework;Assistance with feeding;Direct supervision/assist for medications management;Direct supervision/assist for financial management;Assist for transportation;Help with stairs or ramp for entrance;Two people to help with walking and/or transfers;Two people to help with bathing/dressing/bathroom;Supervision due to cognitive status   Equipment Recommendations  Other (comment) (TBD next venue)       Precautions / Restrictions Precautions Precautions: Fall Recall of Precautions/Restrictions: Impaired Precaution/Restrictions Comments: COVID+ Restrictions Weight Bearing Restrictions Per Provider Order: No RLE Weight Bearing Per Provider Order: Weight bearing as tolerated       Mobility Bed Mobility Overal bed mobility: Needs Assistance Bed Mobility: Supine to Sit     Supine to sit: Mod assist, HOB elevated          Transfers Overall transfer level: Needs assistance Equipment used: Rolling walker (2 wheels) Transfers: Sit to/from Stand Sit to Stand: Independent, +2 physical assistance, From elevated surface                 Balance  Overall balance assessment: Needs assistance Sitting-balance support: Bilateral upper extremity supported, Feet supported Sitting balance-Leahy Scale: Poor     Standing balance support: Bilateral upper extremity supported, During functional activity, Reliant on assistive device for balance Standing balance-Leahy Scale: Poor Standing balance comment: dependent on RW and external support                           ADL either performed or assessed with clinical judgement   ADL Overall ADL's : Needs assistance/impaired     Grooming: Set up;Wash/dry face Grooming Details (indicate cue type and reason): supported sitting chair                 Toilet Transfer: Moderate assistance;+2 for physical assistance;Rolling walker (2 wheels) Toilet Transfer Details (indicate cue type and reason): simulated bed (raised)>to recliner ~5 feet away Toileting- Clothing Manipulation and Hygiene: Total assistance              Extremity/Trunk Assessment Upper Extremity Assessment Upper Extremity Assessment: Generalized weakness            Vision Baseline Vision/History: 1 Wears glasses Ability to See in Adequate Light: 0 Adequate Patient Visual Report: No change from baseline           Communication Communication Communication: Impaired Factors Affecting Communication: Reduced clarity of speech   Cognition Arousal: Alert Behavior During Therapy: Flat affect Cognition: History of cognitive impairments             OT - Cognition Comments: baseline dementia                 Following commands: Impaired Following commands impaired: Follows  one step commands with increased time, Follows multi-step commands inconsistently, Follows multi-step commands with increased time, Follows one step commands inconsistently      Cueing   Cueing Techniques: Verbal cues, Visual cues, Gestural cues, Tactile cues             Pertinent Vitals/ Pain       Pain  Assessment Pain Assessment: Faces Faces Pain Scale: Hurts whole lot Pain Descriptors / Indicators: Discomfort, Grimacing, Guarding, Operative site guarding (stating it hurts) Pain Intervention(s): Limited activity within patient's tolerance, Monitored during session, Repositioned, Patient requesting pain meds-RN notified         Frequency  Min 2X/week        Progress Toward Goals  OT Goals(current goals can now be found in the care plan section)  Progress towards OT goals: Progressing toward goals  Acute Rehab OT Goals Patient Stated Goal: to get warm (warm blanket provided) OT Goal Formulation: With patient Time For Goal Achievement: 01/15/24 Potential to Achieve Goals: Fair  Plan      Co-evaluation    PT/OT/SLP Co-Evaluation/Treatment: Yes Reason for Co-Treatment: Necessary to address cognition/behavior during functional activity;For patient/therapist safety;To address functional/ADL transfers PT goals addressed during session: Balance;Mobility/safety with mobility;Proper use of DME;Strengthening/ROM OT goals addressed during session: Strengthening/ROM;ADL's and self-care      AM-PAC OT 6 Clicks Daily Activity     Outcome Measure   Help from another person eating meals?: A Little Help from another person taking care of personal grooming?: A Little Help from another person toileting, which includes using toliet, bedpan, or urinal?: Total Help from another person bathing (including washing, rinsing, drying)?: A Lot Help from another person to put on and taking off regular upper body clothing?: A Lot Help from another person to put on and taking off regular lower body clothing?: Total 6 Click Score: 12    End of Session Equipment Utilized During Treatment: Gait belt;Rolling walker (2 wheels)  OT Visit Diagnosis: Unsteadiness on feet (R26.81);Other abnormalities of gait and mobility (R26.89);Muscle weakness (generalized) (M62.81);History of falling (Z91.81);Other  symptoms and signs involving cognitive function;Pain Pain - Right/Left: Right Pain - part of body: Leg   Activity Tolerance Patient limited by pain   Patient Left with call bell/phone within reach;in chair;with chair alarm set   Nurse Communication Mobility status;Need for lift equipment (stedy via secure chat)        Time: 9251-9187 OT Time Calculation (min): 24 min  Charges: OT General Charges $OT Visit: 1 Visit OT Treatments $Self Care/Home Management : 8-22 mins Donny BECKER OT Acute Rehabilitation Services Office [phone]    [place] Dorothyann Distel 01/05/2024, 9:07 AM

## 2024-03-17 DEATH — deceased
